# Patient Record
Sex: Male | Born: 1940 | Race: Black or African American | Hispanic: No | Marital: Married | Smoking: Former smoker
Health system: Southern US, Community
[De-identification: ages and names within clinical notes are randomized; demographics above are authoritative.]

## PROBLEM LIST (undated history)

## (undated) DIAGNOSIS — I679 Cerebrovascular disease, unspecified: Secondary | ICD-10-CM

## (undated) DIAGNOSIS — I639 Cerebral infarction, unspecified: Secondary | ICD-10-CM

## (undated) DIAGNOSIS — H409 Unspecified glaucoma: Secondary | ICD-10-CM

## (undated) DIAGNOSIS — R972 Elevated prostate specific antigen [PSA]: Secondary | ICD-10-CM

## (undated) DIAGNOSIS — Z860101 Personal history of adenomatous and serrated colon polyps: Secondary | ICD-10-CM

## (undated) DIAGNOSIS — Z8679 Personal history of other diseases of the circulatory system: Secondary | ICD-10-CM

## (undated) DIAGNOSIS — N369 Urethral disorder, unspecified: Secondary | ICD-10-CM

## (undated) DIAGNOSIS — N400 Enlarged prostate without lower urinary tract symptoms: Secondary | ICD-10-CM

## (undated) DIAGNOSIS — N281 Cyst of kidney, acquired: Secondary | ICD-10-CM

## (undated) DIAGNOSIS — K649 Unspecified hemorrhoids: Secondary | ICD-10-CM

## (undated) DIAGNOSIS — R768 Other specified abnormal immunological findings in serum: Secondary | ICD-10-CM

## (undated) DIAGNOSIS — F1011 Alcohol abuse, in remission: Secondary | ICD-10-CM

## (undated) DIAGNOSIS — I10 Essential (primary) hypertension: Secondary | ICD-10-CM

## (undated) DIAGNOSIS — F1911 Other psychoactive substance abuse, in remission: Secondary | ICD-10-CM

## (undated) DIAGNOSIS — Z86010 Personal history of colonic polyps: Secondary | ICD-10-CM

## (undated) DIAGNOSIS — Q791 Other congenital malformations of diaphragm: Secondary | ICD-10-CM

## (undated) DIAGNOSIS — A0472 Enterocolitis due to Clostridium difficile, not specified as recurrent: Secondary | ICD-10-CM

## (undated) DIAGNOSIS — Z8719 Personal history of other diseases of the digestive system: Secondary | ICD-10-CM

## (undated) DIAGNOSIS — Z8673 Personal history of transient ischemic attack (TIA), and cerebral infarction without residual deficits: Secondary | ICD-10-CM

## (undated) DIAGNOSIS — I119 Hypertensive heart disease without heart failure: Secondary | ICD-10-CM

## (undated) HISTORY — DX: Other congenital malformations of diaphragm: Q79.1

## (undated) HISTORY — DX: Cerebrovascular disease, unspecified: I67.9

## (undated) HISTORY — DX: Benign prostatic hyperplasia without lower urinary tract symptoms: N40.0

## (undated) HISTORY — DX: Hypertensive heart disease without heart failure: I11.9

## (undated) HISTORY — PX: COLONOSCOPY: SHX174

## (undated) HISTORY — DX: Enterocolitis due to Clostridium difficile, not specified as recurrent: A04.72

## (undated) HISTORY — DX: Unspecified hemorrhoids: K64.9

## (undated) HISTORY — DX: Essential (primary) hypertension: I10

## (undated) HISTORY — PX: INGUINAL HERNIA REPAIR: SUR1180

## (undated) HISTORY — PX: LIVER BIOPSY: SHX301

---

## 1998-07-13 ENCOUNTER — Emergency Department (HOSPITAL_COMMUNITY): Admission: EM | Admit: 1998-07-13 | Discharge: 1998-07-13 | Payer: Self-pay | Admitting: Emergency Medicine

## 1998-07-13 ENCOUNTER — Encounter: Payer: Self-pay | Admitting: Emergency Medicine

## 1999-05-03 ENCOUNTER — Emergency Department (HOSPITAL_COMMUNITY): Admission: EM | Admit: 1999-05-03 | Discharge: 1999-05-03 | Payer: Self-pay | Admitting: Emergency Medicine

## 1999-05-03 ENCOUNTER — Encounter: Payer: Self-pay | Admitting: Emergency Medicine

## 2000-02-06 ENCOUNTER — Emergency Department (HOSPITAL_COMMUNITY): Admission: EM | Admit: 2000-02-06 | Discharge: 2000-02-06 | Payer: Self-pay | Admitting: *Deleted

## 2000-02-21 ENCOUNTER — Encounter: Payer: Self-pay | Admitting: Urology

## 2000-02-21 ENCOUNTER — Ambulatory Visit (HOSPITAL_COMMUNITY): Admission: RE | Admit: 2000-02-21 | Discharge: 2000-02-21 | Payer: Self-pay | Admitting: Urology

## 2000-03-05 ENCOUNTER — Ambulatory Visit (HOSPITAL_COMMUNITY): Admission: RE | Admit: 2000-03-05 | Discharge: 2000-03-05 | Payer: Self-pay | Admitting: Urology

## 2000-03-05 ENCOUNTER — Encounter: Payer: Self-pay | Admitting: Urology

## 2000-03-05 ENCOUNTER — Encounter (INDEPENDENT_AMBULATORY_CARE_PROVIDER_SITE_OTHER): Payer: Self-pay | Admitting: Specialist

## 2000-03-05 HISTORY — PX: OTHER SURGICAL HISTORY: SHX169

## 2000-03-07 ENCOUNTER — Emergency Department (HOSPITAL_COMMUNITY): Admission: EM | Admit: 2000-03-07 | Discharge: 2000-03-07 | Payer: Self-pay | Admitting: Emergency Medicine

## 2000-03-08 ENCOUNTER — Emergency Department (HOSPITAL_COMMUNITY): Admission: EM | Admit: 2000-03-08 | Discharge: 2000-03-08 | Payer: Self-pay | Admitting: Emergency Medicine

## 2000-09-01 ENCOUNTER — Encounter: Payer: Self-pay | Admitting: Urology

## 2000-09-01 ENCOUNTER — Ambulatory Visit (HOSPITAL_COMMUNITY): Admission: RE | Admit: 2000-09-01 | Discharge: 2000-09-01 | Payer: Self-pay | Admitting: Urology

## 2000-12-14 ENCOUNTER — Encounter: Payer: Self-pay | Admitting: Gastroenterology

## 2000-12-14 ENCOUNTER — Ambulatory Visit (HOSPITAL_COMMUNITY): Admission: RE | Admit: 2000-12-14 | Discharge: 2000-12-14 | Payer: Self-pay | Admitting: Gastroenterology

## 2002-12-10 ENCOUNTER — Emergency Department (HOSPITAL_COMMUNITY): Admission: EM | Admit: 2002-12-10 | Discharge: 2002-12-10 | Payer: Self-pay | Admitting: Emergency Medicine

## 2002-12-10 ENCOUNTER — Encounter: Payer: Self-pay | Admitting: Emergency Medicine

## 2003-08-11 ENCOUNTER — Emergency Department (HOSPITAL_COMMUNITY): Admission: EM | Admit: 2003-08-11 | Discharge: 2003-08-11 | Payer: Self-pay | Admitting: Emergency Medicine

## 2004-06-30 ENCOUNTER — Inpatient Hospital Stay (HOSPITAL_COMMUNITY): Admission: EM | Admit: 2004-06-30 | Discharge: 2004-07-05 | Payer: Self-pay | Admitting: Emergency Medicine

## 2004-06-30 ENCOUNTER — Ambulatory Visit: Payer: Self-pay | Admitting: Pulmonary Disease

## 2004-07-01 ENCOUNTER — Encounter: Payer: Self-pay | Admitting: Cardiology

## 2004-07-01 ENCOUNTER — Ambulatory Visit: Payer: Self-pay | Admitting: Cardiology

## 2004-07-15 ENCOUNTER — Ambulatory Visit: Payer: Self-pay | Admitting: Pulmonary Disease

## 2004-10-11 ENCOUNTER — Ambulatory Visit: Payer: Self-pay | Admitting: Pulmonary Disease

## 2005-01-07 ENCOUNTER — Ambulatory Visit: Payer: Self-pay | Admitting: Pulmonary Disease

## 2005-05-06 ENCOUNTER — Ambulatory Visit: Payer: Self-pay | Admitting: Pulmonary Disease

## 2005-10-06 ENCOUNTER — Ambulatory Visit: Payer: Self-pay | Admitting: Pulmonary Disease

## 2005-11-07 ENCOUNTER — Ambulatory Visit: Payer: Self-pay | Admitting: Pulmonary Disease

## 2005-12-16 ENCOUNTER — Ambulatory Visit: Payer: Self-pay | Admitting: Gastroenterology

## 2005-12-24 ENCOUNTER — Ambulatory Visit: Payer: Self-pay | Admitting: Gastroenterology

## 2005-12-24 ENCOUNTER — Encounter (INDEPENDENT_AMBULATORY_CARE_PROVIDER_SITE_OTHER): Payer: Self-pay | Admitting: Specialist

## 2006-03-16 ENCOUNTER — Ambulatory Visit: Payer: Self-pay | Admitting: Pulmonary Disease

## 2006-07-14 ENCOUNTER — Ambulatory Visit: Payer: Self-pay | Admitting: Pulmonary Disease

## 2006-12-09 ENCOUNTER — Ambulatory Visit: Payer: Self-pay | Admitting: Pulmonary Disease

## 2007-03-31 ENCOUNTER — Ambulatory Visit: Payer: Self-pay | Admitting: Pulmonary Disease

## 2007-04-05 LAB — CONVERTED CEMR LAB
ALT: 74 units/L — ABNORMAL HIGH (ref 0–53)
AST: 71 units/L — ABNORMAL HIGH (ref 0–37)
Albumin: 4.1 g/dL (ref 3.5–5.2)
Alkaline Phosphatase: 69 units/L (ref 39–117)
Calcium: 10.1 mg/dL (ref 8.4–10.5)
Chloride: 104 meq/L (ref 96–112)
Eosinophils Absolute: 0.1 10*3/uL (ref 0.0–0.6)
Eosinophils Relative: 3.1 % (ref 0.0–5.0)
GFR calc non Af Amer: 90 mL/min
Glucose, Bld: 98 mg/dL (ref 70–99)
MCV: 92.9 fL (ref 78.0–100.0)
Platelets: 166 10*3/uL (ref 150–400)
RBC: 5.72 M/uL (ref 4.22–5.81)
Total CHOL/HDL Ratio: 5.3
Triglycerides: 103 mg/dL (ref 0–149)
VLDL: 21 mg/dL (ref 0–40)
WBC: 4.8 10*3/uL (ref 4.5–10.5)

## 2007-04-20 ENCOUNTER — Encounter: Payer: Self-pay | Admitting: Pulmonary Disease

## 2007-05-04 ENCOUNTER — Ambulatory Visit: Payer: Self-pay | Admitting: Pulmonary Disease

## 2007-05-04 ENCOUNTER — Ambulatory Visit: Payer: Self-pay | Admitting: Cardiology

## 2007-05-04 ENCOUNTER — Inpatient Hospital Stay (HOSPITAL_COMMUNITY): Admission: EM | Admit: 2007-05-04 | Discharge: 2007-05-05 | Payer: Self-pay | Admitting: Emergency Medicine

## 2007-05-05 ENCOUNTER — Encounter: Payer: Self-pay | Admitting: Cardiology

## 2007-05-05 ENCOUNTER — Encounter: Payer: Self-pay | Admitting: Pulmonary Disease

## 2007-05-05 ENCOUNTER — Ambulatory Visit: Payer: Self-pay

## 2007-05-05 HISTORY — PX: CARDIOVASCULAR STRESS TEST: SHX262

## 2007-05-06 DIAGNOSIS — N281 Cyst of kidney, acquired: Secondary | ICD-10-CM | POA: Insufficient documentation

## 2007-05-06 DIAGNOSIS — E786 Lipoprotein deficiency: Secondary | ICD-10-CM | POA: Insufficient documentation

## 2007-05-06 DIAGNOSIS — D126 Benign neoplasm of colon, unspecified: Secondary | ICD-10-CM | POA: Insufficient documentation

## 2007-05-06 DIAGNOSIS — Z91199 Patient's noncompliance with other medical treatment and regimen due to unspecified reason: Secondary | ICD-10-CM | POA: Insufficient documentation

## 2007-05-06 DIAGNOSIS — N138 Other obstructive and reflux uropathy: Secondary | ICD-10-CM | POA: Insufficient documentation

## 2007-05-06 DIAGNOSIS — I119 Hypertensive heart disease without heart failure: Secondary | ICD-10-CM | POA: Insufficient documentation

## 2007-05-06 DIAGNOSIS — I10 Essential (primary) hypertension: Secondary | ICD-10-CM | POA: Insufficient documentation

## 2007-05-06 DIAGNOSIS — Z8673 Personal history of transient ischemic attack (TIA), and cerebral infarction without residual deficits: Secondary | ICD-10-CM | POA: Insufficient documentation

## 2007-05-06 DIAGNOSIS — R972 Elevated prostate specific antigen [PSA]: Secondary | ICD-10-CM | POA: Insufficient documentation

## 2007-05-06 DIAGNOSIS — G459 Transient cerebral ischemic attack, unspecified: Secondary | ICD-10-CM | POA: Insufficient documentation

## 2007-05-06 DIAGNOSIS — I629 Nontraumatic intracranial hemorrhage, unspecified: Secondary | ICD-10-CM | POA: Insufficient documentation

## 2007-05-06 DIAGNOSIS — I679 Cerebrovascular disease, unspecified: Secondary | ICD-10-CM | POA: Insufficient documentation

## 2007-05-06 DIAGNOSIS — F1011 Alcohol abuse, in remission: Secondary | ICD-10-CM | POA: Insufficient documentation

## 2007-05-06 DIAGNOSIS — R945 Abnormal results of liver function studies: Secondary | ICD-10-CM | POA: Insufficient documentation

## 2007-05-06 DIAGNOSIS — Z9119 Patient's noncompliance with other medical treatment and regimen: Secondary | ICD-10-CM | POA: Insufficient documentation

## 2007-05-06 DIAGNOSIS — Q791 Other congenital malformations of diaphragm: Secondary | ICD-10-CM | POA: Insufficient documentation

## 2007-05-06 DIAGNOSIS — N401 Enlarged prostate with lower urinary tract symptoms: Secondary | ICD-10-CM

## 2007-05-06 DIAGNOSIS — R319 Hematuria, unspecified: Secondary | ICD-10-CM | POA: Insufficient documentation

## 2007-05-06 DIAGNOSIS — K4090 Unilateral inguinal hernia, without obstruction or gangrene, not specified as recurrent: Secondary | ICD-10-CM | POA: Insufficient documentation

## 2007-05-06 DIAGNOSIS — K649 Unspecified hemorrhoids: Secondary | ICD-10-CM | POA: Insufficient documentation

## 2007-05-12 ENCOUNTER — Telehealth: Payer: Self-pay | Admitting: Pulmonary Disease

## 2007-06-24 ENCOUNTER — Ambulatory Visit (HOSPITAL_COMMUNITY): Admission: RE | Admit: 2007-06-24 | Discharge: 2007-06-24 | Payer: Self-pay | Admitting: Surgery

## 2007-06-24 HISTORY — PX: LAPAROSCOPIC INGUINAL HERNIA WITH UMBILICAL HERNIA: SHX5658

## 2007-07-09 ENCOUNTER — Encounter: Payer: Self-pay | Admitting: Pulmonary Disease

## 2007-11-18 ENCOUNTER — Telehealth (INDEPENDENT_AMBULATORY_CARE_PROVIDER_SITE_OTHER): Payer: Self-pay | Admitting: *Deleted

## 2008-03-28 ENCOUNTER — Ambulatory Visit: Payer: Self-pay | Admitting: Pulmonary Disease

## 2008-04-03 ENCOUNTER — Ambulatory Visit: Payer: Self-pay | Admitting: Pulmonary Disease

## 2008-04-07 ENCOUNTER — Ambulatory Visit: Payer: Self-pay | Admitting: Gastroenterology

## 2008-04-25 ENCOUNTER — Encounter: Payer: Self-pay | Admitting: Gastroenterology

## 2008-04-25 ENCOUNTER — Ambulatory Visit: Payer: Self-pay | Admitting: Gastroenterology

## 2008-04-28 ENCOUNTER — Encounter: Payer: Self-pay | Admitting: Gastroenterology

## 2008-05-07 LAB — CONVERTED CEMR LAB
AST: 52 units/L — ABNORMAL HIGH (ref 0–37)
Albumin: 4.2 g/dL (ref 3.5–5.2)
BUN: 16 mg/dL (ref 6–23)
Basophils Relative: 0.2 % (ref 0.0–3.0)
Calcium: 9.9 mg/dL (ref 8.4–10.5)
Chloride: 103 meq/L (ref 96–112)
Creatinine, Ser: 1.1 mg/dL (ref 0.4–1.5)
Eosinophils Absolute: 0.1 10*3/uL (ref 0.0–0.7)
Eosinophils Relative: 2.9 % (ref 0.0–5.0)
GFR calc non Af Amer: 71 mL/min
HCT: 50.8 % (ref 39.0–52.0)
HDL: 24.1 mg/dL — ABNORMAL LOW (ref >39.0)
Hemoglobin: 17.4 g/dL — ABNORMAL HIGH (ref 13.0–17.0)
LDL Cholesterol: 69 mg/dL (ref 0–99)
MCHC: 34.2 g/dL (ref 30.0–36.0)
MCV: 92.8 fL (ref 78.0–100.0)
Monocytes Absolute: 0.3 10*3/uL (ref 0.1–1.0)
Neutro Abs: 2.7 10*3/uL (ref 1.4–7.7)
Neutrophils Relative %: 62.5 % (ref 43.0–77.0)
RBC: 5.48 M/uL (ref 4.22–5.81)
TSH: 0.95 microintl units/mL (ref 0.35–5.50)
Triglycerides: 100 mg/dL (ref 0–149)
VLDL: 20 mg/dL (ref 0–40)
WBC: 4.3 10*3/uL — ABNORMAL LOW (ref 4.5–10.5)

## 2008-06-28 ENCOUNTER — Telehealth: Payer: Self-pay | Admitting: Pulmonary Disease

## 2008-07-20 ENCOUNTER — Telehealth (INDEPENDENT_AMBULATORY_CARE_PROVIDER_SITE_OTHER): Payer: Self-pay | Admitting: *Deleted

## 2009-01-03 ENCOUNTER — Telehealth: Payer: Self-pay | Admitting: Pulmonary Disease

## 2009-06-20 ENCOUNTER — Ambulatory Visit: Payer: Self-pay | Admitting: Pulmonary Disease

## 2009-06-20 ENCOUNTER — Telehealth (INDEPENDENT_AMBULATORY_CARE_PROVIDER_SITE_OTHER): Payer: Self-pay | Admitting: *Deleted

## 2009-06-20 DIAGNOSIS — M549 Dorsalgia, unspecified: Secondary | ICD-10-CM | POA: Insufficient documentation

## 2009-06-25 ENCOUNTER — Emergency Department (HOSPITAL_COMMUNITY): Admission: EM | Admit: 2009-06-25 | Discharge: 2009-06-25 | Payer: Self-pay | Admitting: Emergency Medicine

## 2009-06-29 ENCOUNTER — Telehealth: Payer: Self-pay | Admitting: Adult Health

## 2009-07-02 LAB — CONVERTED CEMR LAB
ALT: 49 units/L (ref 0–53)
Alkaline Phosphatase: 66 units/L (ref 39–117)
Basophils Relative: 1.1 % (ref 0.0–3.0)
Bilirubin, Direct: 0.1 mg/dL (ref 0.0–0.3)
Calcium: 10 mg/dL (ref 8.4–10.5)
Chloride: 105 meq/L (ref 96–112)
Creatinine, Ser: 1.1 mg/dL (ref 0.4–1.5)
Eosinophils Relative: 3.2 % (ref 0.0–5.0)
Lymphocytes Relative: 27.3 % (ref 12.0–46.0)
MCV: 94.1 fL (ref 78.0–100.0)
Neutrophils Relative %: 62.1 % (ref 43.0–77.0)
RBC: 5.77 M/uL (ref 4.22–5.81)
Sodium: 142 meq/L (ref 135–145)
Total Protein: 7.9 g/dL (ref 6.0–8.3)
WBC: 5.2 10*3/uL (ref 4.5–10.5)

## 2009-07-05 ENCOUNTER — Telehealth: Payer: Self-pay | Admitting: Adult Health

## 2009-08-10 ENCOUNTER — Ambulatory Visit: Payer: Self-pay | Admitting: Pulmonary Disease

## 2009-09-04 ENCOUNTER — Telehealth (INDEPENDENT_AMBULATORY_CARE_PROVIDER_SITE_OTHER): Payer: Self-pay | Admitting: *Deleted

## 2009-09-05 ENCOUNTER — Ambulatory Visit: Payer: Self-pay | Admitting: Pulmonary Disease

## 2009-09-05 DIAGNOSIS — H9209 Otalgia, unspecified ear: Secondary | ICD-10-CM | POA: Insufficient documentation

## 2010-07-04 NOTE — Assessment & Plan Note (Signed)
Summary: back pain/ear ache/cxr 1st/ok per leigh/mg   CC:  Add-on visit for acute right sided back pain & right ear pain....  History of Present Illness: 70 y/o BM here for a follow up visit... he has multiple medical problems as noted below...     ~  August 10, 2009:  he had a good year overall w/ BP controlled on 5 drug regimen & he is reminded to take them regularly... he had left sided back discomfort & saw TP 1/11- improved w/ Advil/ Skelaxin... he had f/u colonoscopy 11/09 w/ several adenomatous polyps removed & DrStark plans repeat in 69yrs... he continues to see DrNesi every 96mo & he does his PSA's (pt reports they are all "OK"... he adamantly refuses all vaccinations.   ~  September 05, 2009:  add-on appt for 2 week hx right sided back pain & right ear pain... notes intermittent (daily 5-6 x per day), severe (9/10), mid right back pain below scap tip... pain ppt by certain movements, occas relieved by other movements, lasts 1-2 min & resolves... "it ain't good" he says- hasn't taken any meds for this despite hx left sided back pain 1/11 (seen by TP & in ER- neg CXR, neg TSpine films, norm lab work)... **we discussed Rx w/ [id number] & ParafonTid regularly to decr freq & severity of the pain. Also c/o right ear pain x 2weeks- exam shows mild OM & Rx w/ antibiotics, & Auralgan gtts.   Current Problems:   1. 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  ~  CXR 1/11 showed elev right hemidiaph w/ scarring, NAD...  2. HYPERTENSION, SEVERE (ICD-401.0) - long hx severe HBP and noncompliance w/ med Rx;  resulted in [place] w/ small ICH 1/06;  supposed to be on a 5 drug regimen:  LABETOLOL 200mg - 2tabsBid,  HYDRALAZINE 25mg Bid,  AMLODIPINE 10mg /d,  COZAAR 100mg /d,  LASIX 40mg /d...  BP= 140/90, & similar at home- taking meds regularly he says, & tolerating well... denies HA, fatigue, visual changes, CP, palipit, dizziness, syncope, dyspnea, edema, etc...   3.  HYPERTENSIVE CARDIOVASCULAR DISEASE (ICD-402.90)  ~  Baseline EKG w/ NSSTTWA...  ~  2D Echo 1/06 showed mod incr LV wall thickness...  ~  repeat 2DEcho 12/08 showed mild incr LV wall thickness, norm LVF w/ EF= 65%...  4. PERS HX NONCOMPLIANCE W/MED TX PRS HAZARDS HLTH (ICD-V15.81) - BP's have been adeq controlled when he takes his meds regularly... we discuss medication compliance at each & every OV.  5. CEREBROVASCULAR DISEASE (ICD-437.9) - MRIBrain 1/06 showed small Rt occipital hem, mild atrophy, & chr sm vessel ischemic dz...  6. Hx of INTRACRANIAL HEMORRHAGE (ICD-432.9)  7. 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 he is counselled on a low chol/ low fat diet & exercise program to help his HDL...  ~  FLP 10/09 showed TChol 113, TG 100, HDL 24, LDL 69... rec> incr exercise.  8. LIVER FUNCTION TESTS, ABNORMAL (ICD-794.8) - Hx prev GI evals:  Remote hx Etoh & drug abuse;  Hx HepA 1960's; GI eval 1979 DrWeissman w/ + HepBSAg & liver bx showing Chr Persist Hepatitis;  GI eval DrStark 2004 w/ prob fatty infiltration of liver;  LFT's  ~ 70/70 11/08;  ~  labs 10/09 showed SGOT= 52, SGPT= 54... rec> no hepatotoxins, diet, keep wt down.  ~  labs 1/11 showed SGOT= 44, SGPT= 49  9. 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  ~  colonoscopy 11/09 by DrStark showed mult polyps 4-58mm size (adenomatous on bx), f/u 63yrs.  10. HEMORRHOIDS (ICD-455.6)  11. INGUINAL HERNIA, RIGHT (ICD-550.90) >> repaired by drGross, CCS in Jan09...  12. PSA, INCREASED (ICD-790.93) - eval by DrNesi for Urology & pt reports that he is checked every 22mo...  ~  labs 6/07 showed PSA= 6.13,    ~  labs 11/08 showed PSA= 5.11 we don't have recent notes from [facility]  13. BENIGN PROSTATIC HYPERTROPHY, HX OF (ICD-V13.8)  14. Hx of HEMATURIA UNSPECIFIED (ICD-599.70) - s/p neg cystoscopy 10/01...  15. RENAL CYST  (ICD-593.2)  16. 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 BACK PAIN (ICD-724.5) - hx fluctuating left/ right sides back pains... TSpine films 1/11 w/ spondylitic changes, athropathy, etc... treated to OTC anti-inflamm rx vs MOBIC 15mg /d, and muscle relaxers Skelaxin vs PARAFON FORTE Tid...   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 heart disease - MGM rheumatism - MGM, mother DM - mother, MGM stroke -  mother, MGM  Social History: Reviewed history from 06/20/2009 and no changes required. former smoker, quit 11/1981 x65yrs <1ppd. no alcohol married 1 child still working: Environmental manager  Review of Systems      See HPI       The patient complains of decreased hearing, dyspnea on exertion, and difficulty walking.  The patient denies anorexia, fever, weight loss, weight gain, vision loss, hoarseness, chest pain, syncope, peripheral edema, prolonged cough, headaches, hemoptysis, abdominal pain, melena, hematochezia, severe indigestion/heartburn, hematuria, incontinence, muscle weakness, suspicious skin lesions, transient blindness, depression, unusual weight change, abnormal bleeding, enlarged  lymph nodes, and angioedema.    Vital Signs:  Patient profile:   70 year old male Height:      71 inches Weight:      196.25 pounds BMI:     27.47 O2 Sat:      94 % on Room air Temp:     97.0 degrees F oral Pulse rate:   63 / minute BP sitting:   140 / 90  (left arm) Cuff size:   regular  Vitals Entered By: Randell Loop CMA (September 05, 2009 11:43 AM)  O2 Sat at Rest %:  94 O2 Flow:  Room air CC: Add-on visit for acute right sided back pain & right ear pain... Is Patient Diabetic? No Pain Assessment Patient in pain? yes      Comments no changes in meds    Physical Exam  Additional Exam:  WD, WN, 70 y/o BM in NAD... GENERAL:  Alert & oriented; pleasant & cooperative. HEENT:  [place]/AT, EOM-wnl, PERRLA, EACs-clear, TMs- red sl bulge on right, NOSE-clear, THROAT-clear & wnl. NECK:  Supple w/ fairROM; no JVD; normal carotid impulses w/o bruits; no thyromegaly or nodules palpated; no lymphadenopathy. CHEST:  Clear to P & A; without wheezes/ rales/ or rhonchi. HEART:  Regular Rhythm; without murmurs/ rubs/ or gallops. ABDOMEN:  Soft & nontender; normal bowel sounds; no organomegaly or masses detected. EXT: without deformities or arthritic changes; no varicose veins/ venous insuffic/ or edema... BACK:  no lesions seen, non-tender to palp or motion of arm/ shoulder... NEURO:  CN's intact; motor testing normal; sensory testing normal; gait normal & balance OK. DERM:  round patch on right lat thigh area- ?ring worm ?dermatitis- try rx w/ Lotrisone cream.    CXR  Procedure date:  09/05/2009  Findings:      CHEST - 2 VIEW Comparison: 06/25/2009 and earlier.   Findings: Lower lung volumes.  Stable elevation of the right hemidiaphragm.  Stable cardiac size and mediastinal contours. Chronic mild rightward deviation of the trachea at the thoracic inlet may reflect left thyromegaly.  No pneumothorax, pulmonary edema, pleural effusion or definite acute airspace disease.  Stable visualized osseous structures.   IMPRESSION: 1.  Lower lung volumes, otherwise no acute cardiopulmonary abnormality. 2.  Suggestion of chronic left lobe thyromegaly.  Correlate with physical exam or thyroid ultrasound.   Read By:  Augusto Gamble,  M.D.    Impression & Recommendations:  Problem # 1:  EAR PAIN (ICD-388.70) He appears to have a mild OM >>  Rx w/ Doxycycline (Allergic Pcn) & Auralgan gtts... His updated medication list for this problem includes:    Doxycycline Hyclate 100 Mg Caps (Doxycycline hyclate) .Marland Kitchen... Take 1 cap by mouth two times a day til gone...    Auralgan Soln (Antipyrine-benzocaine-polycos) ..... Instill 2 drops evry 4-6 h in affected ear...  Problem # 2:  BACK PAIN (ICD-724.5) Appears to be inflamm related... Rx w/ MOBIC15 & PARAFON Tid... +rest/ heat/ etc... His updated medication list for this problem includes:    Aspirin 325 Mg Tabs (Aspirin) .Marland Kitchen... Take 1 tablet by mouth once a day    Meloxicam 15 Mg Tabs (Meloxicam) .Marland Kitchen... Take 1 tab by mouth once daily as needed for inflammation & pain...    Parafon Forte Dsc 500 Mg Tabs (Chlorzoxazone) .Marland Kitchen... Take 1 tab by mouth three times a day for muscle spasm...  Orders: T-2 View CXR (71020TC) Depo- Medrol 80mg  (J1040) Admin of Therapeutic Inj  intramuscular or subcutaneous (16109)  Problem # 3:  HYPERTENSION, SEVERE (ICD-401.0) BP reasonable on 5 med regimen... we discussed & pt reminded to take meds regualrly... His updated medication list for this problem includes:    Labetalol Hcl 200 Mg Tabs (Labetalol hcl) .Marland Kitchen... 2 tabs twice daily    Amlodipine Besylate 10 Mg Tabs (Amlodipine besylate) .Marland Kitchen... 1 tab daily    Lisinopril 20 Mg Tabs (Lisinopril) .Marland Kitchen... Take 1 tablet by mouth once a day    Hydralazine Hcl 25 Mg Tabs (Hydralazine hcl) .Marland Kitchen... 1 tab twice daily    Furosemide 40 Mg Tabs (Furosemide) .Marland Kitchen... 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  Problem # 5:  OTHER MEDICAL PROBLEMS REVIEWED>>> Continue present Rx... NOTE: Radiologist reports mild trach deviation from left ?enlarged thyroid... no mass in neck & he is clinically & biochemically euthyroid... we will continue to observe & assess...  Complete Medication List: 1)  Aspirin 325 Mg Tabs (Aspirin) .... Take 1 tablet by mouth once a day 2)  Labetalol Hcl 200 Mg Tabs (Labetalol hcl) .... 2 tabs twice daily 3)  Amlodipine Besylate 10 Mg Tabs (Amlodipine besylate) .Marland Kitchen.. 1 tab daily 4)  Lisinopril 20 Mg Tabs (  Lisinopril) .... Take 1 tablet by mouth once a day 5)  Hydralazine Hcl 25 Mg Tabs (Hydralazine hcl) .Marland Kitchen.. 1 tab twice daily 6)  Furosemide 40 Mg Tabs (Furosemide) .Marland Kitchen.. 1 tab daily 7)  Cialis 20 Mg Tabs (Tadalafil) .... As needed 8)  Lotrisone 1-0.05 % Crea (Clotrimazole-betamethasone) .... Apply to rash two times a day... 9)  Doxycycline Hyclate 100 Mg Caps (Doxycycline hyclate) .... Take 1 cap by mouth two times a day til gone... 10)  Auralgan Soln (Antipyrine-benzocaine-polycos) .... Instill 2 drops evry 4-6 h in affected ear... 11)  Meloxicam 15 Mg Tabs (Meloxicam) .... Take 1 tab by mouth once daily as needed for inflammation & pain... 12)  Parafon Forte Dsc 500 Mg Tabs (Chlorzoxazone) .... Take 1 tab by mouth three times a day for muscle spasm...  Other Orders: Prescription Created Electronically [id number])  Patient Instructions: 1)  Today we updated your med list- see below.... 2)  For your EAR:  take the DOXTCYCLINE antibiotic twice daily til gone, and use the EAR DROPS- 2 drops in the right ear every 4-6 H for the pain.Marland KitchenMarland Kitchen 3)  For your Back:  rest the chest, no heavy lifting etc... apply heat (heating pad, icey hott, etc... take the MELOXICAM one tab daily, and the PARAFON three times per day... 4)  Continue your BP meds regularly every day... 5)  Call for any questions... Prescriptions: FUROSEMIDE 40 MG  TABS (FUROSEMIDE) 1 tab daily  #30 x prn    Entered by:   Randell Loop CMA   Authorized by:   Michele Mcalpine MD   Signed by:   Randell Loop CMA on 09/05/2009   Method used:   Electronically to        [facility]* (retail)       [address] [place], [place]  [address]       Ph: [phone]       Fax: [phone]   RxID:   [id number] HYDRALAZINE HCL 25 MG  TABS (HYDRALAZINE HCL) 1 tab twice daily  #60 x prn   Entered by:   Randell Loop CMA   Authorized by:   Michele Mcalpine MD   Signed by:   Randell Loop CMA on 09/05/2009   Method used:   Electronically to        [facility]* (retail)       [address] [place], [place]  [address]       Ph: [phone]       Fax: [phone]   RxID:   [id number] LISINOPRIL 20 MG TABS (LISINOPRIL) Take 1 tablet by mouth once a day  #90 Each x 5   Entered by:   Randell Loop CMA   Authorized by:   Michele Mcalpine MD   Signed by:   Randell Loop CMA on 09/05/2009   Method used:   Electronically to        [facility]* (retail)       [address] [place], [place]  [address]       Ph: [phone]       Fax: [phone]   RxID:   [id number] AMLODIPINE BESYLATE 10 MG  TABS (AMLODIPINE BESYLATE) 1 tab daily  #30 Each x 5  Entered by:   Randell Loop CMA   Authorized by:   Michele Mcalpine MD   Signed by:   Randell Loop CMA on 09/05/2009   Method used:   Electronically to        [facility]* (retail)       [address] [place], [place]  [address]       Ph: [phone]       Fax: [phone]   RxID:   [id number] LABETALOL HCL 200 MG  TABS (LABETALOL HCL) 2 tabs twice daily  #120 x prn   Entered by:   Randell Loop CMA   Authorized by:   Michele Mcalpine MD   Signed by:   Randell Loop CMA on 09/05/2009   Method used:   Electronically to        [facility]* (retail)       [address] [place], [place]   [address]       Ph: [phone]       Fax: [phone]   RxID:   [id number] PARAFON FORTE DSC 500 MG TABS (CHLORZOXAZONE) take 1 tab by mouth three times a day for muscle spasm...  #90 x 2   Entered and Authorized by:   Michele Mcalpine MD   Signed by:   Michele Mcalpine MD on 09/05/2009   Method used:   Print then Give to Patient   RxID:   [id number] MELOXICAM 15 MG TABS (MELOXICAM) take 1 tab by mouth once daily as needed for inflammation & pain...  #30 x 2   Entered and Authorized by:   Michele Mcalpine MD   Signed by:   Michele Mcalpine MD on 09/05/2009   Method used:   Print then Give to Patient   RxID:   [id number] AURALGAN  SOLN (ANTIPYRINE-BENZOCAINE-POLYCOS) instill 2 drops evry 4-6 H in affected ear...  #1 vial x 1   Entered and Authorized by:   Michele Mcalpine MD   Signed by:   Michele Mcalpine MD on 09/05/2009   Method used:   Print then Give to Patient   RxID:   [id number] DOXYCYCLINE HYCLATE 100 MG CAPS (DOXYCYCLINE HYCLATE) take 1 cap by mouth two times a day til gone...  #20 x 0   Entered and Authorized by:   Michele Mcalpine MD   Signed by:   Michele Mcalpine MD on 09/05/2009   Method used:   Print then Give to Patient   RxID:   [id number]    Medication Administration  Injection # 1:    Medication: Depo- Medrol 80mg     Diagnosis: BACK PAIN (ICD-724.5)    Route: IM    Site: LUOQ gluteus    Exp Date: 04/2012    Lot #: [id number]    Mfr: Pharmacia    Patient tolerated injection without complications    Given by: Randell Loop CMA (September 05, 2009 1:01 PM)  Orders Added: 1)  T-2 View CXR [71020TC] 2)  Prescription Created Electronically [G8553] 3)  Est. Patient Level IV [89381] 4)  Depo- Medrol 80mg  [J1040] 5)  Admin of Therapeutic Inj  intramuscular or subcutaneous [01751]

## 2010-07-04 NOTE — Progress Notes (Signed)
Summary: ortho referral info  ---- Converted from flag ---- ---- 07/04/2009 3:32 PM, Rubye Oaks NP wrote: thanks for all your hard work   ---- 07/04/2009 3:32 PM, Alfonso Ramus wrote: Jamal Maes to schedule pt an appt with [facility] and they did not take pt's insurance.  Tried to schedule appt with [facility] and they would not schedule appt until pt called their billing office. Called pt and advised him to [employer]. Never heard back from pt so I called [facility] today. Hailey with [facility] stated that pt did call and that they agreed to see him, however, he stated that he had started feeling better and would call them if he needed them. No appt was scheduled.  All the above is documented in order.  Just wanted to let you know. Thanks, Bjorn Loser ------------------------------

## 2010-07-04 NOTE — Progress Notes (Signed)
Summary: shoulder pain  Phone Note Call from Patient Call back at [phone]   Caller: Patient Call For: nadel Reason for Call: Talk to Nurse Summary of Call: pt having intense shoulder pain.  Wants to see SN Initial call taken by: Eugene Gavia,  June 20, 2009 3:10 PM  Follow-up for Phone Call        called, spoke with pt.  Pt c/o left constant shoulder pain x2days.  describes the pain as an achey pain that is intense and worse at ngiht.  Pt states he is ok to be seen by TP-states "I don't care who I'm seen by as long as I'm seen."  appt scheduled with TP for today at 4:15.   Pt aware and ok with this. Follow-up by: Gweneth Dimitri RN,  June 20, 2009 3:39 PM

## 2010-07-04 NOTE — Progress Notes (Signed)
Summary: should pain--pain meds not working  Phone Note Call from Patient   Caller: Patient Summary of Call: pt called back for his lab results and he is aware of the results---pt stated that he saw TP on 1-19 for his shoulder pain---was given skelaxin 800mg  1 three times a day as needed  and has been taking ibuprofen 800mg  as needed.  the pain got so bad he went to the ER for his shoulder and they gave him vicodin and pt stated that this has not helped at all either---would like to know what else he can do for this??  please advise.  allergies---pcn Initial call taken by: Randell Loop CMA,  June 29, 2009 4:02 PM  Follow-up for Phone Call        I do not have any other suggestions for shoulder pain  except referral to ortho which is welcome to have.  This is unusal, pain in localized in shoulded, no redness,or fever xray in ER w/ acute process.  refer to ortho,  cont w/ motrin and vicodin, w/ ice and heat.  Please contact office for sooner follow up if symptoms do not improve or worsen  Follow-up by: Gorgeous Newlun NP,  June 29, 2009 4:51 PM

## 2010-07-04 NOTE — Assessment & Plan Note (Signed)
Summary: Acute NP office - back pain   CC:  back pain at left shoulder blade x2-3days and states feels like arthritic pain and is worse at night than throughout the day.  History of Present Illness: 70 with known hx of  multiple  medical problems including cerbrovascular dz, HTN-severe  June 20, 2009--Presents for an acute office visit. Complains of back pain at left shoulder blade x2-3days, states feels like arthritic pain and is worse at night than throughout the day. Worse if he lifts arm up over head, no radicular symptoms. no extremity weakness. Denies chest pain, dyspnea, orthopnea, hemoptysis, fever, n/v/d, edema, headache, exertional chest pain. B/P is elevated, he did not take meds today. We talked about the dangers of HTN.     Preventive Screening-Counseling & Management  Alcohol-Tobacco     Smoking Status: quit  Medications Prior to Update: 1)  Adult Aspirin Low Strength 81 Mg  Tbdp (Aspirin) .Marland Kitchen.. 1 Tab Daily 2)  Lisinopril 20 Mg Tabs (Lisinopril) .... Take 1 Tablet By Mouth Once A Day 3)  Amlodipine Besylate 10 Mg  Tabs (Amlodipine Besylate) .Marland Kitchen.. 1 Tab Daily 4)  Labetalol Hcl 200 Mg  Tabs (Labetalol Hcl) .... 2 Tabs Twice Daily 5)  Hydralazine Hcl 25 Mg  Tabs (Hydralazine Hcl) .Marland Kitchen.. 1 Tab Twice Daily 6)  Furosemide 40 Mg  Tabs (Furosemide) .Marland Kitchen.. 1 Tab Daily 7)  Cialis 20 Mg Tabs (Tadalafil) .... As Needed  Current Medications (verified): 1)  Adult Aspirin Low Strength 81 Mg  Tbdp (Aspirin) .Marland Kitchen.. 1 Tab Daily 2)  Lisinopril 20 Mg Tabs (Lisinopril) .... Take 1 Tablet By Mouth Once A Day 3)  Amlodipine Besylate 10 Mg  Tabs (Amlodipine Besylate) .Marland Kitchen.. 1 Tab Daily 4)  Labetalol Hcl 200 Mg  Tabs (Labetalol Hcl) .... 2 Tabs Twice Daily 5)  Hydralazine Hcl 25 Mg  Tabs (Hydralazine Hcl) .Marland Kitchen.. 1 Tab Twice Daily 6)  Furosemide 40 Mg  Tabs (Furosemide) .Marland Kitchen.. 1 Tab Daily 7)  Cialis 20 Mg Tabs (Tadalafil) .... As Needed  Allergies (verified): 1)  Penicillin V Potassium (Penicillin V  Potassium)  Past History:  Family History: Last updated: 06/20/2009 heart disease - MGM rheumatism - MGM, mother DM - mother, MGM stroke -  mother, MGM  Social History: Last updated: 06/20/2009 former smoker, quit 11/1981 x21yrs <1ppd. no alcohol married 1 child still working: Environmental manager  Risk Factors: Smoking Status: quit (06/20/2009)  Past Medical History: 70. 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 HYPERTENSION, SEVERE (ICD-401.0) - long hx severe HBP and noncompliance w/ med Rx;  resulted in [place] w/ small ICH 1/06;  supposed to be on a 5 drug regimen:  LABETOLOL 200mg - 2tabsBid,  AMLODIPINE 10mg /d,  COZAAR 100mg /d,  LASIX 40mg /d,  HYDRALAZINE 25mg Bid...    3. HYPERTENSIVE CARDIOVASCULAR DISEASE (ICD-402.90) - Baseline EKG w/ NSSTTWA;  2D 1/06 showed mod incr LV wall thickness;  4. PERS HX NONCOMPLIANCE W/MED TX PRS HAZARDS HLTH (ICD-V15.81) - BP's have been adeq controlled when he takes his meds regularly;  5. CEREBROVASCULAR DISEASE (ICD-437.9) - MRIBrain 1/06 showed small Rt occipital hem, mild atrophy, & chr sm vessel ischemic dz;  6. Hx of INTRACRANIAL HEMORRHAGE (ICD-432.9)  7. 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 he is counselled on a low chol/ low fat diet & exercise program to help his HDL...  ~  FLP 10/09 showed TChol 113, TG 100, HDL 24, LDL 69... rec> incr  exercise.  8. LIVER FUNCTION TESTS, ABNORMAL (ICD-794.8) - Hx prev GI evals:  Remote hx Etoh & drug abuse;  Hx HepA 1960's; GI eval 1979 DrWeissman w/ + HepBSAg & liver bx showing Chr Persist Hepatitis;  GI eval DrStark 2004 w/ prob fatty infiltration of liver;  LFT's  ~ 70/70 11/08;  ~  labs 10/09 showed SGOT= 52, SGPT= 54... rec> no hepatotoxins, diet, keep wt down.  Family History: heart disease - MGM rheumatism - MGM, mother DM - mother, MGM stroke -  mother, MGM  Social History: former smoker, quit 11/1981  x80yrs <1ppd. no alcohol married 1 child still working: photographerSmoking Status:  quit  Review of Systems      See HPI  Vital Signs:  Patient profile:   70 year old male Height:      71 inches Weight:      192.50 pounds BMI:     26.95 O2 Sat:      93 % on Room air Temp:     97.3 degrees F oral Pulse rate:   76 / minute BP sitting:   158 / 94  (right arm) Cuff size:   regular  Vitals Entered By: Boone Master CNA (June 20, 2009 4:24 PM)  O2 Flow:  Room air CC: back pain at left shoulder blade x2-3days, states feels like arthritic pain and is worse at night than throughout the day Is Patient Diabetic? No Comments Medications reviewed with patient Daytime contact number verified with patient. Boone Master CNA  June 20, 2009 4:24 PM    Physical Exam  Additional Exam:  WD, WN, 70 y/o BM in NAD... GENERAL:  Alert & oriented; pleasant & cooperative. HEENT:  [place]/AT, EOM-wnl, PERRLA, Fundi-benign, EACs-clear, TMs-wnl, NOSE-clear, THROAT-clear & wnl. NECK:  Supple w/ fairROM; no JVD; normal carotid impulses w/o bruits; no thyromegaly or nodules palpated; no lymphadenopathy. CHEST:  Clear to P & A; without wheezes/ rales/ or rhonchi. HEART:  Regular Rhythm; without murmurs/ rubs/ or gallops. ABDOMEN:  Soft & nontender; normal bowel sounds; no organomegaly or masses detected. RECTAL:  Neg - prostate 3+ & nontender w/o nodules; stool hematest neg. EXT: without deformities or arthritic changes; no varicose veins/ venous insuffic/ or edema, tender along subscapular area, no rash, or eccymosis. nml grips bilaterally, equal strength.  NEURO:  CN's intact; motor testing normal; sensory testing normal; gait normal & balance OK. DERM:  No lesions noted; no rash etc...     Impression & Recommendations:  Problem # 1:  BACK PAIN (ICD-724.5)  Shoulder /back strain will use NSAIDS - cautiously  REC   Warm heat to shoulder  Skelaxin 800mg  three times a day as needed muscle  spasm.  Advil 200mg  3 tab two times a day for 4 days Call if your blood pressure >160/90  Please contact office for sooner follow up if symptoms do not improve or worsen  follow up Dr. Kriste Basque in 6 weeks (or first availabe ) for physical.   Orders: Est. Patient Level IV (16109)  Problem # 2:  HYPERTENSION, SEVERE (ICD-401.0) advised to take meds regularly, do not skip.  labs pending.   His updated medication list for this problem includes:    Lisinopril 20 Mg Tabs (Lisinopril) .Marland Kitchen... Take 1 tablet by mouth once a day    Amlodipine Besylate 10 Mg Tabs (Amlodipine besylate) .Marland Kitchen... 1 tab daily    Labetalol Hcl 200 Mg Tabs (Labetalol hcl) .Marland Kitchen... 2 tabs twice daily    Hydralazine Hcl 25 Mg Tabs (Hydralazine  hcl) ..... 1 tab twice daily    Furosemide 40 Mg Tabs (Furosemide) .Marland Kitchen... 1 tab daily  Orders: TLB-BMP (Basic Metabolic Panel-BMET) (80048-METABOL) TLB-CBC Platelet - w/Differential (85025-CBCD) TLB-Hepatic/Liver Function Pnl (80076-HEPATIC) TLB-TSH (Thyroid Stimulating Hormone) (84443-TSH) Est. Patient Level IV (04540)  Medications Added to Medication List This Visit: 1)  Skelaxin 800 Mg Tabs (Metaxalone) .Marland Kitchen.. 1 by mouth three times a day as needed muscle spasm  Complete Medication List: 1)  Adult Aspirin Low Strength 81 Mg Tbdp (Aspirin) .Marland Kitchen.. 1 tab daily 2)  Lisinopril 20 Mg Tabs (Lisinopril) .... Take 1 tablet by mouth once a day 3)  Amlodipine Besylate 10 Mg Tabs (Amlodipine besylate) .Marland Kitchen.. 1 tab daily 4)  Labetalol Hcl 200 Mg Tabs (Labetalol hcl) .... 2 tabs twice daily 5)  Hydralazine Hcl 25 Mg Tabs (Hydralazine hcl) .Marland Kitchen.. 1 tab twice daily 6)  Furosemide 40 Mg Tabs (Furosemide) .Marland Kitchen.. 1 tab daily 7)  Cialis 20 Mg Tabs (Tadalafil) .... As needed 8)  Skelaxin 800 Mg Tabs (Metaxalone) .Marland Kitchen.. 1 by mouth three times a day as needed muscle spasm  Patient Instructions: 1)  Please take you blood pressure meds, do not forget, it is very dangerous to skip these meds.  2)  Warm heat to  shoulder  3)  Skelaxin 800mg  three times a day as needed muscle spasm.  4)  Advil 200mg  3 tab two times a day for 4 days 5)  Call if your blood pressure >160/90  6)  Please contact office for sooner follow up if symptoms do not improve or worsen  7)  follow up Dr. Kriste Basque in 6 weeks (or first availabe ) for physical.  Prescriptions: FUROSEMIDE 40 MG  TABS (FUROSEMIDE) 1 tab daily  #30 Each x 3   Entered and Authorized by:   Rubye Oaks NP   Signed by:   Rubye Oaks NP on 06/20/2009   Method used:   Electronically to        [facility]* (retail)       [address] [place], [place]  [address]       Ph: [phone]       Fax: [phone]   RxID:   [id number] SKELAXIN 800 MG TABS (METAXALONE) 1 by mouth three times a day as needed muscle spasm  #30 x 0   Entered and Authorized by:   Rubye Oaks NP   Signed by:   Rubye Oaks NP on 06/20/2009   Method used:   Electronically to        [facility]* (retail)       [address] [place], [place]  [address]       Ph: [phone]       Fax: [phone]   RxID:   [id number]    Immunization History:  Influenza Immunization History:    Influenza:  refuses (06/20/2009)  Pneumovax Immunization History:    Pneumovax:  refuses (06/20/2009)

## 2010-07-04 NOTE — Assessment & Plan Note (Signed)
Summary: yearly f/u/fasting/apc   CC:  16 month ROV & review of mult medical problems....  History of Present Illness: 70 y/o [place] here for a follow up visit... he has multiple medical problems as noted below...     ~  August 10, 2009:  he had a good year overall w/ BP controlled on 5 drug regimen & he is reminded to take them regularly... he had left sided back discomfort & saw TP 1/11- improved w/ Advil/ Skelaxin... he had f/u colonoscopy 11/09 w/ several adenomatous polyps removed & DrStark plans repaet in 99yrs... he continues to see DrNesi every 69mo & he does his PSA's (pt reports they are all "OK"... he adamantly refuses all vaccinations.   Current Problems:   1. 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  ~  CXR 1/11 showed elev right hemidiaph w/ scarring, NAD...  2. HYPERTENSION, SEVERE (ICD-401.0) - long hx severe HBP and noncompliance w/ med Rx;  resulted in [place] w/ small ICH 1/06;  supposed to be on a 5 drug regimen:  LABETOLOL 200mg - 2tabsBid,  HYDRALAZINE 25mg Bid,  AMLODIPINE 10mg /d,  COZAAR 100mg /d,  LASIX 40mg /d...  BP= 122/84, & similar at home- taking meds regularly he says, & tolerating well... denies HA, fatigue, visual changes, CP, palipit, dizziness, syncope, dyspnea, edema, etc...   3. HYPERTENSIVE CARDIOVASCULAR DISEASE (ICD-402.90) -   ~  Baseline EKG w/ NSSTTWA...  ~  2D Echo 1/06 showed mod incr LV wall thickness...  ~  repeat 2DEcho 12/08 showed mild incr LV wall thickness, norm LVF w/ EF= 65%...  4. PERS HX NONCOMPLIANCE W/MED TX PRS HAZARDS HLTH (ICD-V15.81) - BP's have been adeq controlled when he takes his meds regularly...  5. CEREBROVASCULAR DISEASE (ICD-437.9) - MRIBrain 1/06 showed small Rt occipital hem, mild atrophy, & chr sm vessel ischemic dz...  6. Hx of INTRACRANIAL HEMORRHAGE (ICD-432.9)  7. 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 he is counselled on a low chol/ low  fat diet & exercise program to help his HDL...  ~  FLP 10/09 showed TChol 113, TG 100, HDL 24, LDL 69... rec> incr exercise.  8. LIVER FUNCTION TESTS, ABNORMAL (ICD-794.8) - Hx prev GI evals:  Remote hx Etoh & drug abuse;  Hx HepA 1960's; GI eval 1979 DrWeissman w/ + HepBSAg & liver bx showing Chr Persist Hepatitis;  GI eval DrStark 2004 w/ prob fatty infiltration of liver;  LFT's  ~ 70/70 11/08;  ~  labs 10/09 showed SGOT= 52, SGPT= 54... rec> no hepatotoxins, diet, keep wt down.  ~  labs 1/11 showed SGOT= 44, SGPT= 49  9. COLONIC POLYPS (ICD-211.3)  ~  colonoscopy 7/07 by DrStark w/ mult polyps 4-38mm size (adenomatous)& hems;  f/u 3 yrs.  ~  colonoscopy 11/09 by DrStark showed mult polyps 4-72mm size (adenomatous on bx), f/u 55yrs.  10. HEMORRHOIDS (ICD-455.6)  11. INGUINAL HERNIA, RIGHT (ICD-550.90) >> repaired by drGross, [facility] in Jan09...  12. PSA, INCREASED (ICD-790.93) - eval by DrNesi for Urology & pt reports that he is checked every 69mo...  ~  labs 6/07 showed PSA= 6.13,    ~  labs 11/08 showed PSA= 5.11 we don't have recent notes from [facility]  13. BENIGN PROSTATIC HYPERTROPHY, HX OF (ICD-V13.8)  14. Hx of HEMATURIA UNSPECIFIED (ICD-599.70) - s/p neg cystoscopy 10/01...  15. RENAL CYST (ICD-593.2)  16. 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 heart disease - MGM rheumatism - MGM, mother DM - mother, MGM stroke -  mother, MGM  Social History: Reviewed history from 06/20/2009 and no changes required. former smoker, quit 11/1981 x40yrs <1ppd. 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    Vital Signs:  Patient profile:   70 year old male Height:      71 inches Weight:      194.50 pounds BMI:     27.23 O2 Sat:      97 % on Room air Temp:     97.9 degrees F oral Pulse rate:   60 / minute BP sitting:   122 / 84  (right arm) Cuff size:   regular  Vitals Entered By: Randell Loop CMA (August 10, 2009 11:28 AM)  O2 Sat at Rest %:  97 O2 Flow:  Room air CC: 16 month ROV & review of mult medical problems... Is Patient Diabetic? No Pain Assessment Patient in pain? no      Comments MEDS UPDATED TODAY   Physical Exam  Additional Exam:  WD, WN, 70 y/o [place] in  [place]... GENERAL:  Alert & oriented; pleasant & cooperative. HEENT:  [place]/AT, EOM-wnl, PERRLA, Fundi-benign, EACs-clear, TMs-wnl, NOSE-clear, THROAT-clear & wnl. NECK:  Supple w/ fairROM; no JVD; normal carotid impulses w/o bruits; no thyromegaly or nodules palpated; no lymphadenopathy. CHEST:  Clear to P & A; without wheezes/ rales/ or rhonchi. HEART:  Regular Rhythm; without murmurs/ rubs/ or gallops. ABDOMEN:  Soft & nontender; normal bowel sounds; no organomegaly or masses detected. EXT: without deformities or arthritic changes; no varicose veins/ venous insuffic/ or edema... NEURO:  CN's intact; motor testing normal; sensory testing normal; gait normal & balance OK. DERM:  round patch on right lat thigh area- ?ring worm ?dermatitis- try rx w/ Lotrisone cream.    CXR  Procedure date:  06/25/2009  Findings:       CHEST - 2 VIEW Comparison: 05/04/2007    Findings: Trachea is midline.  Heart size normal.  Right   hemidiaphragm is elevated with scarring in the adjacent right lower   lobe.  Lungs are otherwise clear.  No pleural fluid. Reversal of   the normal thoracic kyphosis.    IMPRESSION:   No acute  findings.    Read By:  Reyes Ivan.,  M.D.   MISC. Report  Procedure date:  06/20/2009  Findings:      BMP (METABOL)   Sodium                    142 mEq/L                   135-145   Potassium                 4.0 mEq/L                   3.5-5.1   Chloride                  105 mEq/L                   96-112   Carbon Dioxide            31 mEq/L                    19-32   Glucose                   86 mg/dL                    66-44   BUN                       17 mg/dL                    0-34   Creatinine                1.1 mg/dL                   7.4-2.5   Calcium                   10.0 mg/dL                  9.5-63.8   GFR                       85.44 mL/min                >60  CBC Platelet w/Diff (CBCD)   White Cell Count          5.2 K/uL                    4.5-10.5    Red Cell Count            5.77 Mil/uL                 4.22-5.81   Hemoglobin           [H]  18.1 H g/dL                 75.6-43.3   Hematocrit           [H]  54.3 H %                    39.0-52.0   MCV                       94.1 fl  78.0-100.0   Platelet Count            175.0 K/uL                  150.0-400.0   Neutrophil %              62.1 %                      43.0-77.0   Lymphocyte %              27.3 %                      12.0-46.0   Monocyte %                6.3 %                       3.0-12.0   Eosinophils%              3.2 %                       0.0-5.0   Basophils %               1.1 %                       0.0-3.0  Hepatic/Liver Function Panel (HEPATIC)   Total Bilirubin           0.9 mg/dL                   1.4-7.8  Direct Bilirubin          0.1 mg/dL                   2.9-5.6   Alkaline Phosphatase      66 U/L                      39-117   AST                  [H]  44 U/L                      0-37   ALT                       49 U/L                      0-53   Total Protein             7.9 g/dL                    2.1-3.0   Albumin                   4.2 g/dL                    8.6-5.7   TSH (TSH)   FastTSH                   1.13 uIU/mL                 0.35-5.50   Impression & Recommendations:  Problem # 1:  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  Problem # 2:  HYPERTENSION, SEVERE (ICD-401.0)  Controlled on meds...needs to take meds regularly & we discussed this. His updated medication list for this problem includes:    Lisinopril 20 Mg Tabs (Lisinopril) .Marland Kitchen... Take 1 tablet by mouth once a day    Amlodipine Besylate 10 Mg Tabs (Amlodipine besylate) .Marland Kitchen... 1 tab daily    Labetalol Hcl 200 Mg Tabs (Labetalol hcl) .Marland Kitchen... 2 tabs twice daily    Hydralazine Hcl 25 Mg Tabs (Hydralazine hcl) .Marland Kitchen... 1 tab twice daily    Furosemide 40 Mg Tabs (Furosemide) .Marland Kitchen... 1 tab daily  Problem # 3:  CEREBROVASCULAR DISEASE  (ICD-437.9) Advised ASA 81mg /d, take BP meds regularly, monitor BP at home...  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 rec- start exercise program as we discussed....  Problem # 5:  LIVER FUNCTION TESTS, ABNORMAL (ICD-794.8) Just min elevation SGOT... no alcohol, no drugs, etc... keep weight down.  Problem # 6:  COLONIC POLYPS (ICD-211.3) He is up to date on his Q26yr colonoscopies...  Problem # 7:  PSA, INCREASED (ICD-790.93) Followed by Thermon Leyland Q23mo per pt hx... we don't have notes...  Problem # 8:  OTHER MEDICAL PROBLEMS AS NOTED>>>  Complete Medication List: 1)  Aspirin 325 Mg Tabs (Aspirin) .... Take 1 tablet by mouth once a day 2)  Lisinopril 20 Mg Tabs (Lisinopril) .... Take 1 tablet by mouth once a day 3)  Amlodipine Besylate 10 Mg Tabs (Amlodipine besylate) .Marland Kitchen.. 1 tab daily 4)  Labetalol Hcl 200 Mg Tabs (Labetalol hcl) .... 2 tabs twice daily 5)  Hydralazine Hcl 25 Mg Tabs (Hydralazine hcl) .Marland Kitchen.. 1 tab twice daily 6)  Furosemide 40 Mg Tabs (Furosemide) .Marland Kitchen.. 1 tab daily 7)  Cialis 20 Mg Tabs (Tadalafil) .... As needed 8)  Lotrisone 1-0.05 % Crea (Clotrimazole-betamethasone) .... Apply to rash two times a day...  Other Orders: Prescription Created Electronically ([id number])  Patient Instructions: 1)  Today we updated your med list- see below.... 2)  We refilled your meds for 2011... 3)  Be sure to take your BP meds every day as directed... 4)  For your Cholesterol- low fat diet & INCREASE YOUR EXERCISE!!! 5)  You should also get the PNEUMONIA vaccine & the combination TETANUS vaccine called the TDAP- you can get these at any time so give [place] a call when you decide to preceed... 6)  The FLU shots come out every fall & that is also recommended to you. 7)  Call for any questions... 8)  Please schedule a follow-up appointment in 1 year, sooner as needed... Prescriptions: LOTRISONE 1-0.05 % CREA (CLOTRIMAZOLE-BETAMETHASONE) apply to rash two  times a day...  #30gm tube x prn   Entered and Authorized by:   Michele Mcalpine MD   Signed by:   Michele Mcalpine MD on 08/10/2009   Method used:   Print then Give to Patient   RxID:   [id number] FUROSEMIDE 40 MG  TABS (FUROSEMIDE) 1 tab daily  #30 x prn   Entered and Authorized by:   Michele Mcalpine MD   Signed by:   Michele Mcalpine MD on 08/10/2009   Method used:   Print then Give to Patient   RxID:   [id number] HYDRALAZINE HCL 25 MG  TABS (HYDRALAZINE HCL) 1 tab twice daily  #60 x prn   Entered and Authorized by:   Michele Mcalpine MD   Signed by:   Michele Mcalpine MD on 08/10/2009   Method used:   Print then  Give to Patient   RxID:   [id number] LABETALOL HCL 200 MG  TABS (LABETALOL HCL) 2 tabs twice daily  #120 x prn   Entered and Authorized by:   Michele Mcalpine MD   Signed by:   Michele Mcalpine MD on 08/10/2009   Method used:   Print then Give to Patient   RxID:   [id number] AMLODIPINE BESYLATE 10 MG  TABS (AMLODIPINE BESYLATE) 1 tab daily  #30 x prn   Entered and Authorized by:   Michele Mcalpine MD   Signed by:   Michele Mcalpine MD on 08/10/2009   Method used:   Print then Give to Patient   RxID:   [id number] LISINOPRIL 20 MG TABS (LISINOPRIL) Take 1 tablet by mouth once a day  #30 x prn   Entered and Authorized by:   Michele Mcalpine MD   Signed by:   Michele Mcalpine MD on 08/10/2009   Method used:   Print then Give to Patient   RxID:   [id number]

## 2010-07-04 NOTE — Progress Notes (Signed)
Summary: talk to nurse  Phone Note Call from Patient Call back at [phone]   Caller: Patient Call For: nadel Reason for Call: Talk to Nurse Summary of Call: Pain in back so bad it "takes his breath away" also pain in ear both x 10 days, and gets worse at night, wants appt with SN or TP tomorrow, please advise. Initial call taken by: Darletta Moll,  September 04, 2009 4:49 PM  Follow-up for Phone Call        called and spoke with pt.  pt c/o pain across back under "shoulder blades."  Pt states pain started approx 1 to 2 weeks ago.  Pt states pt is constant but if he "moves a wrong way it will send shooting pains in back."  Pt denies hx of back problems and denies doing anything to have hurt his back.  pt does not have an ortho dr.  pt hasn't taken anything OTC for pain.  Please advise.Marland Kitchen  also, pt also c/o R ear pain x 10 days that is constant but worse at night.  Pt denies ringing in ears, drainage, problems with hearing, or "popping noises."  please advise.  thanks.  Aundra Millet Reynolds LPN  September 05, 9526 5:10 PM   Additional Follow-up for Phone Call Additional follow up Details #1::        please add pt on at 12 on 4-06 to see SN---pt will need cxr prior to ov with SN tomorrow.  thanks Randell Loop CMA  September 04, 2009 5:17 PM     Additional Follow-up for Phone Call Additional follow up Details #2::    called and spoke with pt.  pt aware of appt date and time with SN tomorrow. pt will come early to have cxr done prior to appt.  Aundra Millet Reynolds LPN  September 05, 4130 5:20 PM

## 2010-09-20 ENCOUNTER — Other Ambulatory Visit: Payer: Self-pay | Admitting: Pulmonary Disease

## 2010-10-15 NOTE — Letter (Signed)
April 06, 2007    Angelia Mould. Derrell Lolling, M.D.  [address]  [place], [place] [address]   RE:  [name], [name]  MRN:  [id number]  /  DOB:  05/29/1941   Dear [name]:   [name] is a 70 year old gentleman whom I have followed for  general medical purposes.  He presented to the office the other day  complaining of a knot in his right side for the last 2 weeks.  He  states it occurred while he was lifting a lawnmower.  Exam revealed a  right inguinal hernia of moderate size, and he has a moderate amount of  discomfort associated with this.  It is easily reducible, but in view of  his young age and active lifestyle, I feel surgical repair is warranted.  He has an appointment to see you in the office on April 20, 2007.   From the general medical standpoint, [name] has significant  hypertension and had a small intracranial hemorrhage in 2006 as a result  of stopping his medications.  He made a full and complete neurologic  recovery, and has been good about taking his medications ever since  then.  His current hypertensive regimen includes Cozaar 100 mg p.o.  daily; Norvasc 10 mg p.o. daily; labetalol 200 mg 2 tablets p.o. b.i.d.;  hydralazine 25 mg p.o. b.i.d.  His recent blood pressure was 130/90 and  he follows a low-salt diet as well.  He knows the importance of taking  his medications regularly.   Other medical problems include an elevated PSA which was evaluated last  year by Dr. Brunilda Payor.  His PSA has dropped from 6.13 to 5.11 on recent  check.  He also has a history of adenomatous  colon polyps followed by Dr. Russella Dar.  He has chronic persistent hepatitis  in the past with mildly elevated liver enzymes which remain in the 70  range (see enclosed records).   Thank you very much for evaluation of this nice gentleman.  I look  forward to hearing your comments.    Sincerely,      Lonzo Cloud. Kriste Basque, MD  Electronically Signed    SMN/MedQ  DD:  04/06/2007  DT: 04/06/2007  Job #: [id number]

## 2010-10-15 NOTE — Op Note (Signed)
Dustin, Hamilton NO.:  [id number]   MEDICAL RECORD NO.:  [id number]          PATIENT TYPE:  AMB   LOCATION:  DAY                          FACILITY:  [facility]   PHYSICIAN:  Ardeth Sportsman, MD     DATE OF BIRTH:  07-05-1940   DATE OF PROCEDURE:  DATE OF DISCHARGE:                               OPERATIVE REPORT   PRIMARY CARE PHYSICIAN:  __________   SURGEON:  Ardeth Sportsman, MD   ASSISTANT:  None.   PREOPERATIVE DIAGNOSIS:  Right inguinal hernia and umbilical hernia.   POSTOPERATIVE DIAGNOSIS:  Right indirect inguinal hernia and  nonincarcerated umbilical hernia.   PROCEDURE PERFORMED:  Laparoscopic right inguinal hernia and open  umbilical hernia repair with underlay mesh repair.   ANESTHESIA:  1. General anesthesia.  2. Local anesthetic in a field block around all port sites.  3. Right ilioinguinal/genitofemoral/cord nerve block.   SPECIMENS:  None.   DRAINS:  None.   ESTIMATED BLOOD LOSS:  5 ml.   COMPLICATIONS:  None.   INDICATIONS:  Mr. Cyndia Bent is a pleasant 70 year old male who has had  episodes of right groin pain and was sent for surgical consultation with  evidence of an inguinal hernia.  He also had evidence of an umbilical  hernia.  Past physiology of herniation with its risks of incarceration  and strangulation was discussed.  The options were discussed,  recommendations made for laparoscopic preperitoneal exploration with  right inguinal hernia as well as an umbilical hernia possibly with mesh.   The risks of stroke, heart attack, deep venous thrombosis, pulmonary  embolism and death were discussed.  The risks such as bleeding, need for  transfusion, wound infection and abscess, injury to the organs, hernia  recurrence, prolonged pain and other risks were discussed.  Questions  were answered.  He agreed to proceed.   OPERATIVE FINDINGS:  He had a classic indirect inguinal hernia of  moderate size in his right groin.  He had about  a 2 x 2 cm umbilical  hernia defect.   DESCRIPTION OF PROCEDURE:  Informed consent was confirmed.  The patient  underwent general anesthesia without any difficulty.  He had IV  antibiotics given just prior to surgery.  He had sequential pressure  devices active during the entire case.  He was positioned supine with  both arms tucked.  He had a Foley catheter sterilely placed.  His  abdomen was prepped and draped in sterile fashion.   Entry was gained in the abdominal wall through an infraumbilical  curvilinear incision.  A nick was made in the anterior rectus fascia  just left of midline.  A 12 mm Hassan port was placed in the  preperitoneal plane.  Capnopreperitoneum to provided good  abdominal wall inflation.  A 10 mm/30 degree scope was used to help free  the peritoneum off the anterior abdominal wall, especially in the right  lower quadrant.  Enough working space was created such to have 5 mm  ports could be placed in the right and left mid abdomen.   Attention was turned towards the right lower quadrant.  Peritoneum was  freed off laterally and inferiorly.  A window was made between the  anterior lateral bladder and the anterior lateral pelvic wall down to  near the obturator foramen.  This helped define the inguinal region.  Peritoneum could easily be seen crawling up into the internal ring  consistent with indirect inguinal hernia.  The peritoneal hernia sac was  freed off the cord structures carefully and reduced out of the inguinal  canal.  The hernia sac was peeled back as proximally as possible.  In  doing this, there was a tear of moderate size.  The tear of the  peritoneum of moderate size with exposure of intraperitoneal contents.  The peritoneal defect was closed using 3-0 Vicryl figure-of-eight stitch  using laparoscopic suturing.   A 15 x 15 cm ultra light weight (Ultrapro) polypropylene mesh was cut to  a half-skull shape and placed in the preperitoneal space  in the right  lower quadrant.  A medial and inferior flap rested in the true pelvis  between the anterior and medial bladder and anterior to the pelvis.  Mesh laid well proximally, laterally and superiorly such that it is at  least 3 inches circumferential coverage around the internal ring and to  cover any potential femoral, direct, and obturator defects.  The hernia  sac and peritoneum was grasped and elevated anteriorly and slightly  cephalad.  Capnopreperitoneum was released.  Ports were removed.   Attention was turned toward umbilical hernia repair.  Circumferential  dissection was made around the umbilical stalk, and it was removed and  exposed the hernia defect.  A 6.5 cm Proceed ventral hernia patch was  placed in the preperitoneal plane.  It was secured to the fascia using  #1 Ethibond interrupted stitches x6 in a circumferential pattern and  taking bites of mesh with it.  The umbilical defect was closed in  transverse fashion using interrupted #1 Ethibond stitches.  The center  one was used to help tack the umbilical stalk down.  Skin was closed  using 4-0 Monocryl stitch.  Sterile dressing was applied.  The patient  was extubated and brought to the recovery room in stable condition.   I explained the operative findings with the patient's family.  I  discussed with the patient postoperative care just prior to surgery and  the family right after surgery.  They expressed their understanding and  appreciation.      Ardeth Sportsman, MD  Electronically Signed     SCG/MEDQ  D:  06/24/2007  T:  06/24/2007  Job:  [id number]

## 2010-10-15 NOTE — H&P (Signed)
Dustin Hamilton NO.:  [id number]   MEDICAL RECORD NO.:  [id number]          PATIENT TYPE:  EMS   LOCATION:  ED                           FACILITY:  [facility]   PHYSICIAN:  Lonzo Cloud. Kriste Basque, MD     DATE OF BIRTH:  04/18/1941   DATE OF ADMISSION:  05/04/2007  DATE OF DISCHARGE:                              HISTORY & PHYSICAL   CHIEF COMPLAINT:  Chest pain and presyncope.   HISTORY OF PRESENT ILLNESS:  This is a very pleasant 70 year old African-  American male patient who is followed by Dr. Alroy Dust for his  internal medicine needs.  He presents, today, to the [facility]  Emergency Room with chief complaint of dizziness and chest pain.  [name] reports that he was in his usual state of state of health until  this morning at which time he was at [employer] having some pictures  blown up in the photo department when he developed sudden onset of  lightheadedness and chest pain.  He described the chest pain primarily  focused in the left sternal/substernal area.  He stated that it was  nonradiating, and denied associated nausea or diaphoresis.  He rated his  pain of approximately 5-6/10 at time of onset.  He did not note that it  was associated with significant exertion.  But did initially sit down  and rest, as he felt he was going to pass-out if he did not.  He stated  that the pain lasted approximately 15-20 minutes before it resolved  spontaneously with rest.  He was able to drive himself to the [facility] Emergency Room.  He notes that upon arrival he was pain free;  however, he did sleep for an uncertain amount of time in the car prior  to actually walking into the emergency room.  He stated that upon  arrival to the emergency room he remained lightheaded; however, he was,  in fact, pain free.  His initial cardiac markers are negative.  His  chest x-ray showed chronic right hemidiaphragm elevation.  He denied  cough, fever, chills, headache, denied  lower extremity swelling, or any  other pertinent positives of date.  Of note, he had been scheduled for  an elective hernia repair on May 03, 2007; however, this was  cancelled as his blood pressure was noted to be 184/110; and the  anesthesiologist told him that he had an abnormal EKG.   Upon further evaluation and questioning Mr. Mehring does tell me that  he takes his medications routinely; however, he does not take them as  prescribed.  He is on the very complex antihypertensive regimen  consisting of Cozaar, labetalol, hydralazine, and furosemide.  Of which  he tells me that he routinely takes his labetalol and hydralazine both  scheduled as twice daily dosing, typically at night only.  He said that,  today, he actually took the labetalol and hydralazine both, as  prescribed, this morning.  He will be admitted for further evaluation,  to rule out acute coronary syndrome, as well as to evaluate his  antihypertensives.   PAST  MEDICAL HISTORY:  1. Hypertension  2. Inguinal and ventral hernia.  3. Mild obesity, weight 193.  4. 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   SOCIAL HISTORY:  Retired, worked in [employer], he stopped smoking  in 1983 and he has not had alcohol since 1963.  Denies recreational drug  use.   FAMILY HISTORY:  Positive for coronary artery disease, positive for  diabetes, positive for hypertension, and positive for hyperlipidemia.   ALLERGIES:  He is allergic to PENICILLIN.   HOME MEDICATIONS INCLUDE:  1. Aspirin 325 mg daily.  2. Cozaar 1 mg daily.  3. Labetalol 200 mg, 2 tablets twice daily.  4. Hydralazine 25 mg 1 tablet twice daily.  5. Furosemide unknown dose.   REVIEW OF SYSTEMS:  Per HPI.   PHYSICAL EXAMINATION:  VITAL SIGNS:  Temperature 97.9, heart rate 50s-  60s sinus rhythm, blood pressure 133/77, respirations 16, saturations  98% on 1 liter nasal cannula.  GENERAL: This is a well-developed 70 year old  African American male  sitting up in the bed with no acute distress.  HEENT:  Normocephalic, mucous membranes are moist.  There is no JVD or  adenopathy.  PULMONARY:  Clear to auscultation.  CARDIAC:  Regular rate and rhythm without murmur, rub or gallop.  EXTREMITIES:  Demonstrate brisk capillary refill, no edema, and are warm  to palpation.  ABDOMEN:  Nontender.  Positive bowel sounds.  No organomegaly.  Positive  ventral hernia noted.  GENITOURINARY:  Due to void.  NEUROLOGICALLY:  Intact.   Chest x-ray demonstrates chronic right elevated hemidiaphragm.   LABORATORY DATA:  EKG first-degree heart block and nonspecific changes.  Hemoglobin 16.2, hematocrit 46.2, white blood cell count 5, platelet  152.  Sodium 140, potassium 4.2, chloride 105, CO2 30, BUN 15,  creatinine 1.25, glucose 101, CK-MB is 23.4; troponin I is 0.05.   IMPRESSION/PLAN:  1. Chest pain with associated presyncopal event.  Initial cardiac      enzymes are negative.  EKG with nonspecific changes and first-      degree heart block.  Of note, he has been taking double dosing of      his labetalol and hydralazine; so uncertain as to whether this      could have been relative hypotension/orthostatic-type symptoms.      Plan for this is to admit to a regular telemetry unit, resume      aspirin and home medications, cycle cardiac      enzymes.   Cardiology will evaluate to rule out ACS, as well      as evaluate cardiac risk factors, we will repeat EKG in the      morning, ordered a 2-D echocardiogram, and finally will continue      close monitoring of blood pressure on prescribed blood pressure      regimen.      Zenia Resides, NP      Lonzo Cloud. Kriste Basque, MD  Electronically Signed    PB/MEDQ  D:  05/04/2007  T:  05/05/2007  Job:  [id number]   cc:   Lonzo Cloud. Kriste Basque, MD  [address]  [place]  [place] [address]

## 2010-10-15 NOTE — H&P (Signed)
NAMEGEN, CLAGG NO.:  [id number]   MEDICAL RECORD NO.:  [id number]          PATIENT TYPE:  EMS   LOCATION:  ED                           [facility]AC[facility]   PHYSICIAN:  Rollene Rotunda, MD, FACCDATE OF BIRTH:  1940-10-15   DATE OF ADMISSION:  05/04/2007  DATE OF DISCHARGE:                              HISTORY & PHYSICAL   PRIMARY CARDIOLOGIST:  New, being seen by Dr. Rollene Rotunda.   PRIMARY CARE PHYSICIAN:  Lonzo Cloud. Kriste Basque, MD   We were asked to consult on this 70 year old African American gentleman  with no known history of coronary artery disease.  He presented to  [facility] Emergency Room  today complaining of chest discomfort in the  setting of dizziness.   Mr. Preis is a very pleasant gentleman.  He states up until this  morning he has felt very well; never has experienced any discomfort in  his chest.  He states he does not even know what indigestion feels like.  Apparently he was being worked up for a hernia repair on Monday.  The  anesthesiologist was concerned about his blood pressure and EKG;  declined to do the procedure at that time and recommended the patient  follow up for a stress test before proceeding with surgery.   The patient states he drove to Dr. Jodelle Green office this morning to  arrange to have a stress test done.  He left there and went to [employer]; was working on some pictures there when he suddenly became dizzy  and had some substernal chest discomfort.  He described it as a  heaviness; it was localized and nonradiating.  He rated it as 5-6 a  scale of 1-10.  It was associated with the dizziness and also associated  with some shortness of breath; otherwise no associated symptoms.  He  states he sat down; the dizziness continued.  He felt that he had to get  home; so he got up from his sitting position holding onto various items  in the store.  He made his way out the door to his car.  He continued to  feel badly.  He  thought about calling  9-1-1, but then decided he would  drive himself to the emergency room.  He got to [facility],  parked, called his wife and told here what was going on.  Apparently the  chest discomfort eased off, but the dizziness returned.  He thought he  would sit there for a minute; the next thing he knew he fell asleep.  He  states the chest pain lasted all of 15 minutes.  He woke up and the  dizziness continued, so he decided to come in and get checked out.   The only thing different for Mr. Gladu, he states that for the last 3  weeks he has been taking his b.i.d. medicines all at one time at night;  because when he takes them in the morning he gets nauseated.   ALLERGIES:  Include PENICILLIN.   MEDICATIONS:  These are not quite clear at this time.  He states he  takes 5 blood pressure medicines and one aspirin.  Medications being:  1. Cozaar 100 mg daily.  2. Norvasc 10 mg daily.  3. Lasix 40 mg daily.  4. Labetalol 200 mg, supposed to be b.i.d.; instead he is taking 400      mg at night.  The other blood pressure medication he is not sure what it is.  He  thinks it begins with an A.   PAST MEDICAL HISTORY:  1. Hypertension, with a history of poor compliance -- resulting in an      intraparenchymal hemorrhage in 2006.  Management was for      conservative treatment at that time, with blood pressure control.  2. 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  3. Chronic persistent hepatitis, secondary to fatty infiltration of      the liver -- secondary to previous alcohol and drug abuse.  The      patient states his last alcohol intake was March 15, 1962.  His      last tobacco intake was December 14, 1981.  4. History of retinal hemorrhage.  5. History of BPH.  6. Supposedly had an echocardiogram at some point in the past; results      are not available at this time.   SOCIAL HISTORY:  Lives in [place] with his wife.  He is an  IT trainer.  He has one adult child and one grandchild.  Alcohol and tobacco use as stated above.   FAMILY HISTORY:  Positive for CAD on his mother's side.  She also  apparently had a history of hypertension and diabetes; was on  hemodialysis prior to her death.  Father's history is unknown.   REVIEW OF SYSTEMS:  Positive for dizziness and chest pain, shortness of  breath as described above.  Otherwise, the patient denies any other  symptoms.   PHYSICAL EXAMINATION:  Temperature 97.3, heart rate 51-76, respirations  18-20, blood pressure 113/58, saturations 99% on 2 liters.  GENERAL:  Generally well-appearing; no acute distress.  HEENT:  Normocephalic, atraumatic.  Pupils equal, round, reactive to  light.  NECK:  Supple without lymphadenopathy, no bruits.  No JVD.  LUNGS: Clear to auscultation bilaterally.  CARDIOVASCULAR:  Exam reveals S1-S2 regular rate and rhythm.  SKIN:  Warm and dry.  ABDOMEN:  Soft, nontender.  Positive bowel sounds.  EXTREMITIES:  Lower Extremities without clubbing or cyanosis.  He has 2+  DP bilaterally.  NEUROLOGICAL:  Unremarkable.   EKG:  Sinus bradycardia with a rate of 51.  No acute ST or T-wave  changes.  The patient has a first-degree block with nonspecific  intraventricular block.   LAB WORK:  Hemoglobin 16.20, hematocrit 46.4 WBCs 5, platelets 152,000.  Sodium 140, potassium 4.2, chloride 105, CO2 30, BUN 15, creatinine 1.2,  with a glucose 101.  AST 50, ALT 56.  Point-of-care markers negative.  Troponin x2 sets.  First set of cardiac enzymes:  CK elevated at 1932,  CK-MB 262, troponin of 0.01.  PT/INR 14.1 and 1.1.   A patient with chest pain with no previous history, in the setting of  dizziness; most likely orthostatic blood pressure.  This patient is  taking medications other than as prescribed.  It is recommended that the  patient be admitted overnight for observation.  If enzymes are negative,  plan on outpatient Cardiolite in our  office in the morning.  It is okay  to proceed with a 2-D echocardiogram in our office also.  Recommend  switching the patient's blood pressure  medicines to once a day formula if possible, as he is complaining of  nausea with a.m. medications.  Also, would recommend he stagger his  medications.  I will leave this up to Dr. Kriste Basque, his primary care  physician.  The patient has been evaluated by Dr. Antoine Poche, who agrees  with the plan.      Dorian Pod, ACNP      Rollene Rotunda, MD, [facility]  Electronically Signed    MB/MEDQ  D:  05/04/2007  T:  05/05/2007  Job:  [id number]   cc:   Lonzo Cloud. Kriste Basque, MD  [address]  [place]  [place] [address]

## 2010-10-18 NOTE — Discharge Summary (Signed)
Dustin Hamilton, Dustin Hamilton NO.:  [id number]   MEDICAL RECORD NO.:  [id number]          PATIENT TYPE:  INP   LOCATION:  0346                         FACILITY:  [facility]   PHYSICIAN:  Lonzo Cloud. Kriste Basque, M.D. [facility] OF BIRTH:  17-Dec-1940   DATE OF ADMISSION:  06/30/2004  DATE OF DISCHARGE:                                 DISCHARGE SUMMARY   FINAL DIAGNOSES:  1.  Admitted June 30, 2004 by Dr. Jayme Cloud with sudden onset of dizziness      and headache.  Evaluation revealed a tiny 3-4 mm intraparenchymal      hemorrhage and accelerated hypertension.  The evaluated by neurosurgery,      Dr. Yetta Barre, with recommendation for conservative management and blood      pressure control.  2.  Severe hypertension - medications restarted and adjusted this admission.      He has a history of hypertensive cardiovascular disease as well.  3.  History of elevated liver enzymes in the past.  He was evaluated by Dr.      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  Liver enzymes are normal this admission.  4.  History of hematuria with left renal cyst on evaluation by Dr. Brunilda Payor in      the past.  He had a negative cystoscopy in 2002.  He has known benign      prostatic hypertrophy.  5.  Remote history of retinal hemorrhage.  6.  Negative colonoscopy in 1998 except for hemorrhoids.  7.  Allergy to PENICILLIN.   BRIEF HISTORY AND PHYSICAL:  The patient is a 70 year old gentleman whom I  have followed for general medical purposes.  He was last seen in the office  in April 2005, taking four blood pressure medications with good control and  a blood pressure 130/80.  Apparently, the patient stopped his blood pressure  medicine and on the day of admission developed sudden onset of  disequilibrium and headache.   He presented to the emergency room where he  was evaluated by the emergency room physician, found to have severe  hypertension with a blood pressure of 218/136, and a CT brain that showed a  small 3-4 mm hyperdensity in the left occipital lobe suggestive of small  parenchymal hemorrhage.  He was admitted for further evaluation and  treatment.   PAST MEDICAL HISTORY:  1.  Hypertension.  The patient had a long history of hypertension and      hypertensive cardiovascular disease.  He was previously on labetalol 200      mg p.o. b.i.d.; Norvasc 10 mg p.o. daily; Lasix 40 mg p.o. daily; and      Cozaar 100 mg p.o. daily.  His blood pressure was 130/80 when last      checked in April 2005 on these medications.  He has a history of  hypertensive cardiovascular disease with some increased left ventricular      wall thickness on previous 2-D echocardiogram.  2.  Elevated liver enzymes.  He has a history of elevated liver enzymes in      the past.  He was evaluated by Dr. Russella Dar and found to have a history of      hepatitis A in the past and antibodies to hepatitis B.  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  He has remote history of alcohol and drug abuse.      Liver enzymes were rechecked this hospitalization and were negative.  3.  History hematuria.  He has a history of hematuria with evaluation by Dr.      Brunilda Payor in the past.  He had a negative cysto in 2002.  His IVP showed a      left renal cyst.  He also has BPH.  4.  History of retinal hemorrhage - evaluated by ophthalmology in the past.  5.  History of colonoscopy in 1998 with hemorrhoids only.  6.  History of allergy to PENICILLIN.   I should note that previous CT of the brain in 2004 showed small vessel  disease and was otherwise negative.  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   PHYSICAL EXAMINATION:  Physical examination at the time  of admission  revealed a 70 year old gentleman in mild distress with hypertension.  Blood  pressure in the ER 218/136, pulse 84 per minute and regular, respirations 18  per minute and not labored, temperature 97 degrees.  HEENT exam was  unremarkable.  Fundi could not be well seen.  Neck exam was supple; no  jugular venous distention, no carotid bruits, no thyromegaly or  lymphadenopathy.  Chest was clear to percussion and auscultation.  Cardiac  exam revealed a regular rate and rhythm, normal S1 and S2, without murmurs,  rubs, or gallops detected.  The abdomen was soft, bowel sounds were intact.  There was no evidence of hepatosplenomegaly.  Extremities showed no  cyanosis, clubbing, or edema.  Neurologic exam was grossly intact without  acute abnormalities detected.   LABORATORY DATA:  EKG showed a normal sinus rhythm with first degree A-V  block, left axis deviation, and nonspecific ST-T wave changes.  The cardiac  monitor showed all normal sinus rhythm.  Chest x-ray showed some mild  basilar atelectasis and chronic elevation of the right hemidiaphragm;  otherwise, negative.  CT of the brain revealed chronic small vessel disease  and a small 4 mm parenchymal hemorrhage in the left occipital lobe.  A  follow-up CT showed everything to be stable.  Hemoglobin 18.7, repeat 16.4,  hematocrit 55.7, white count 4500 with a normal differential.  Sodium 140,  potassium 3.7, chloride 107, CO2 28, BUN 12, creatinine 1.0, blood sugar  134.  Calcium 9.4, total protein 6.4, albumin 3.1, AST 32, ALT 35, alk phos  47, total bilirubin 0.8.  Hemoglobin A1c 5.7.  CPK elevated at 1500 with  negative MB and negative troponin.  This was felt to be secondary to some  mild rhabdomyolysis.  Cholesterol 128, triglycerides 133, HDL 27, LDL 78.  TSH 0.38.  PSA 3.06.  Drug screen was negative on admission.  Urinalysis was  clear.  HOSPITAL COURSE:  The patient was admitted with severe hypertension and a  small  occipital hemorrhage.  He was monitored in  the ICU and placed on a  Cardene drip for blood pressure control.  He was seen in consultation by Dr.  Yetta Barre of the neurosurgery service.  He agreed with conservative management  and vigorous blood pressure control.  He was given Antivert as needed for  dizziness and Zofran as needed for nausea.  The patient's symptoms resolved  quickly.  His blood pressure came under good control with the Cardene and he  was started back on his oral regimen including labetalol, Norvasc, Lasix,  and Cozaar.  Doses were adjusted with improvement in his blood pressure.  As  we weaned the Cardene drip we found we had to add hydralazine 25 mg p.o.  q.i.d. to maintain control of his blood pressure.  He was transferred out of  intensive care and onto the ward where he ambulated and demonstrated good  blood pressure control on a five-drug regimen.  He was felt to be MHB and  ready for discharge on July 05, 2004.   MEDICATIONS AT DISCHARGE:  1.  Labetalol 200 mg two tablets p.o. b.i.d.  2.  Norvasc 10 mg one p.o. daily.  3.  Lasix 40 mg p.o. q.a.m.  4.  Cozaar 100 mg p.o. daily.  5.  Apresoline 25 mg p.o. q.i.d.   He was recommended to try Prilosec for any stomach acid and Tylenol as  needed for pain.  He was instructed on a low salt diet.  Activity as  tolerated.  He was carefully counseled to take his medication regularly to  avoid recurrent hypertensive crises and the risk of a severe stroke.  The  patient understands this and has stated he will comply.   The patient instructed to return to the office on July 11, 2004 at 12:30  p.m. for a follow-up visit.   CONDITION AT DISCHARGE:  Improved.      SMN/MEDQ  D:  07/05/2004  T:  07/05/2004  Job:  [id number]

## 2010-10-18 NOTE — Op Note (Signed)
[facility]  Patient:    Dustin Hamilton, Dustin Hamilton                   MRN: [id number] Proc. Date: 03/05/00 Adm. Date:  [id number] Disc. Date: [id number] Attending:  Devoria Albe                           Operative Report  PREOPERATIVE DIAGNOSIS:  Gross hematuria and incomplete filling of the left collecting system on intravenous pyelogram.  POSTOPERATIVE DIAGNOSIS:  Gross hematuria.  PROCEDURE PERFORMED:  Cystoscopy, left retrograde pyelogram, washings for cytology, and ureteroscopy.  SURGEON:  Lindaann Slough, M.D.  ANESTHESIA:  General.  INDICATIONS:  The patient is a 70 year old male who had gross painless hematuria.  An IVP showed normal right kidney and incomplete filling of the lower pole calyx of the left kidney.  The patient is scheduled for cystoscopy and retrograde pyelogram.  DESCRIPTION OF PROCEDURE:  Under general anesthesia, the patient was prepped and draped, and placed in the dorsolithotomy position.  A #22 Wappler cystoscope was inserted in the bladder.  The patient has a trilobar prostatic hypertrophy with a large median lobe.  The bladder is moderately trabeculated. There is no stone or tumor in the bladder.  The ureteral orifices are in normal position and shape with clear efflux.  A #8-French cone-tipped catheter was passed through the cystoscope into the left ureteral orifice.  Contrast was then injected through the ureteral catheter into the left ureter.  The ureter appears normal.  The collecting system appears normal.  There is no evidence of filling defect in the ureter nor in the collecting system.  The cone-tipped catheter was then removed.  A guidewire was passed through the cystoscope into the left ureteral orifice, but the catheter could not be passed beyond the intramural ureter because of a J-hook deformity of the distal ureter.  The cystoscope and guidewire were then removed.  A #6 rigid ureteroscope was then passed in the  bladder and into the ureteral orifices. Then, a guidewire was passed under direct vision into the ureter all the way up into the collecting system.  The intramural ureter was then dilated with the ureteral balloon catheter.  The cystoscope was then removed.  The ureteroscope was then passed in the ureter.  The ureter appears normal.  There is no evidence of stone or tumor in the ureter.  A flexible ureteroscope was then passed over the guidewire, but the ureteroscope could not be advanced through the ureter or into the collecting system.  The ureteroscope was then removed.  The guidewire was back loaded into the cystoscope and an open end catheter was passed over the guidewire.  The guidewire was removed.  The __________ was then irrigated with normal saline and washings were sent for cytology.  The guidewire was then passed through the open end catheter and the open end catheter was removed.  A #6-French - 26 double-J catheter was passed over the guidewire.  The proximal end of the double-J catheter is in the collecting system.  The distal end is in the bladder.  The bladder was then emptied and the cystoscope removed.  The patient tolerated the procedure well and left the OR in satisfactory condition to the postanesthesia care unit. DD:  03/05/00 TD:  03/06/00 Job: [id number]

## 2010-10-18 NOTE — Consult Note (Signed)
Dustin Hamilton, SEGRETO NO.:  [id number]   MEDICAL RECORD NO.:  [id number]          PATIENT TYPE:  INP   LOCATION:  0155                         FACILITY:  [facility]   PHYSICIAN:  Tia Alert, MD     DATE OF BIRTH:  August 19, 1940   DATE OF CONSULTATION:  06/30/2004  DATE OF DISCHARGE:                                   CONSULTATION   CHIEF COMPLAINT:  Intracerebral hemorrhage.   HISTORY OF PRESENT ILLNESS:  Dustin Hamilton is a 70 year old black male who  was admitted through the emergency department with a hypertensive crisis,  headache, and dizziness.  He was at church this morning when he noted the  onset of dizziness.  He was brought to the emergency department, where he  had a blood pressure of 218/136.  He was treated with IV medications and  when his blood pressure normalized his symptoms improved.  He had a head CT,  which showed a small punctate hyperdensity in the left occipital white  matter and a neurosurgical consultation was requested.  The patient denies  any headache, nausea and vomiting, numbness, tingling, or weakness at this  time.  He has no dizziness at this time.   PAST MEDICAL HISTORY:  1.  Hypertension.  2.  Inguinal hernia repair.  3.  The patient states he has been noncompliant with his antihypertensives.   MEDICATIONS:  1.  Cozaar.  2.  Norvasc.  3.  Lasix.  Again he has been noncompliant with his medications.   ALLERGIES:  PENICILLIN.   SOCIAL HISTORY:  Remote smoking history.  He denies alcohol use.   FAMILY HISTORY:  Noncontributory.   PHYSICAL EXAMINATION:  VITAL SIGNS:  His blood pressure is 136/85, pulse is  85, respirations 20.  GENERAL:  A very pleasant cooperative black male in no acute distress.  HEENT:  Normocephalic atraumatic.  Extra ocular movements are intact.  NECK:  Supple.  HEART:  Regular rate and rhythm.  NEUROLOGIC:  He is awake and alert.  He is oriented x4.  He has no aphasia.  He has a good attention span.   His fund of knowledge and memory appear to be  appropriate.  He has no facial asymmetry.  His tongue protrudes in midline.  His extra ocular movements are intact, as stated.  His visual fields appear  to be full to non-formal confrontation and he can read a chart across the  room at about 15 feet quite easily.  His gait is not tested but his strength  is good throughout with good muscle tone and bulking.  He has no pronator  drift.  Sensation is grossly intact throughout.   IMAGING STUDIES:  A CT scan of the brain I have reviewed as well as the  report.  It shows a tiny hyperdensity in the left occipital white matter  without surrounding mass effect or edema.   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  I agree with his repeat head CT in the morning.  If he has  any change in his neurological exam or in the head CT, please call me.  Otherwise I feel he needs no neurosurgical intervention.  I would control  his blood pressure as you are doing.  I also stressed that he be compliant  be with his medications upon discharge and he has demonstrated  understanding.   I want to thank you for this consult.      DSJ/MEDQ  D:  06/30/2004  T:  06/30/2004  Job:  [id number]

## 2010-11-26 ENCOUNTER — Encounter: Payer: Self-pay | Admitting: Adult Health

## 2010-11-27 ENCOUNTER — Encounter: Payer: Self-pay | Admitting: Adult Health

## 2010-11-27 ENCOUNTER — Ambulatory Visit (INDEPENDENT_AMBULATORY_CARE_PROVIDER_SITE_OTHER): Payer: Medicare Other | Admitting: Adult Health

## 2010-11-27 DIAGNOSIS — M549 Dorsalgia, unspecified: Secondary | ICD-10-CM

## 2010-11-27 MED ORDER — MELOXICAM 15 MG PO TABS: 15.0000 mg | ORAL_TABLET | Freq: Every day | ORAL | Status: DC | PRN

## 2010-11-27 MED ORDER — TADALAFIL 20 MG PO TABS: 20.0000 mg | ORAL_TABLET | Freq: Every day | ORAL | Status: DC | PRN

## 2010-11-27 NOTE — Progress Notes (Signed)
  Subjective:    Patient ID: Dustin Hamilton, male    DOB: 06-06-1940, 69 y.o.   MRN: [id number]  HPI 70 yo male with known hx of HTN, PVD, and BPH  11/27/10 Acute OV  Pt presents for a work in visit. Complains of mid-back pain on right side x2days. Worse with walking .  Motrin helps w/ pain relief No urinary issues. No known injury  He has a known hx of back pain with previous . 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 NO known injury. Has been doing some lifting. No leg weakness . Pain w/ bending.     Review of Systems Constitutional:   No  weight loss, night sweats,  Fevers, chills, fatigue, or  lassitude.  HEENT:   No headaches,  Difficulty swallowing,  Tooth/dental problems, or  Sore throat,                No sneezing, itching, ear ache, nasal congestion, post nasal drip,   CV:  No chest pain,  Orthopnea, PND, swelling in lower extremities, anasarca, dizziness, palpitations, syncope.   GI  No heartburn, indigestion, abdominal pain, nausea, vomiting, diarrhea, change in bowel habits, loss of appetite, bloody stools.   Resp: No shortness of breath with exertion or at rest.  No excess mucus, no productive cough,  No non-productive cough,  No coughing up of blood.  No change in color of mucus.  No wheezing.  No chest wall deformity  Skin: no rash or lesions.  GU: no dysuria, change in color of urine, no urgency or frequency.  No flank pain, no hematuria   MS:  No joint pain or swelling.   Psych:  No change in mood or affect. No depression or anxiety.  No memory loss.          Objective:   Physical Exam GEN: A/Ox3; pleasant , NAD, elderly  HEENT:  [place]/AT,  EACs-clear, TMs-wnl, NOSE-clear, THROAT-clear, no lesions, no postnasal drip or exudate noted.   NECK:  Supple w/ fair ROM; no JVD; normal carotid impulses w/o bruits; no thyromegaly or nodules palpated; no lymphadenopathy.  RESP  Clear  P & A; w/o, wheezes/ rales/ or rhonchi.no accessory muscle use, no  dullness to percussion  CARD:  RRR, no m/r/g  , no peripheral edema, pulses intact, no cyanosis or clubbing.  GI:   Soft & nt; nml bowel sounds; no organomegaly or masses detected.  Musco: Warm bil, no deformities or joint swelling noted,  Tender along mid lower back , neg SLR, nml gait. No ecchymosis or redness noted.  Normal strength bilaterally   Neuro: alert, no focal deficits noted.    Skin: Warm, no lesions or rashes         Assessment & Plan:

## 2010-11-27 NOTE — Patient Instructions (Signed)
Mobic 15mg  daily with food As needed  For back/joint pain.  Alternate ice and heat  To back.  Please contact office for sooner follow up if symptoms do not improve or worsen or seek emergency care  follow up Dr. Kriste Basque  For physical in 2 months

## 2010-12-03 NOTE — Assessment & Plan Note (Signed)
Back strain  Plan Mobic 15mg  daily with food As needed  For back/joint pain.  Alternate ice and heat  To back.  Please contact office for sooner follow up if symptoms do not improve or worsen or seek emergency care  follow up Dr. Kriste Basque  For physical in 2 months

## 2010-12-14 ENCOUNTER — Other Ambulatory Visit: Payer: Self-pay | Admitting: Pulmonary Disease

## 2011-01-29 ENCOUNTER — Encounter: Payer: Self-pay | Admitting: Pulmonary Disease

## 2011-01-29 ENCOUNTER — Ambulatory Visit (INDEPENDENT_AMBULATORY_CARE_PROVIDER_SITE_OTHER): Payer: Medicare Other | Admitting: Pulmonary Disease

## 2011-01-29 DIAGNOSIS — R945 Abnormal results of liver function studies: Secondary | ICD-10-CM

## 2011-01-29 DIAGNOSIS — M549 Dorsalgia, unspecified: Secondary | ICD-10-CM

## 2011-01-29 DIAGNOSIS — E786 Lipoprotein deficiency: Secondary | ICD-10-CM

## 2011-01-29 DIAGNOSIS — R972 Elevated prostate specific antigen [PSA]: Secondary | ICD-10-CM

## 2011-01-29 DIAGNOSIS — D126 Benign neoplasm of colon, unspecified: Secondary | ICD-10-CM

## 2011-01-29 DIAGNOSIS — I10 Essential (primary) hypertension: Secondary | ICD-10-CM

## 2011-01-29 DIAGNOSIS — R319 Hematuria, unspecified: Secondary | ICD-10-CM

## 2011-01-29 DIAGNOSIS — I119 Hypertensive heart disease without heart failure: Secondary | ICD-10-CM

## 2011-01-29 DIAGNOSIS — Z87898 Personal history of other specified conditions: Secondary | ICD-10-CM

## 2011-01-29 DIAGNOSIS — F419 Anxiety disorder, unspecified: Secondary | ICD-10-CM

## 2011-01-29 DIAGNOSIS — I679 Cerebrovascular disease, unspecified: Secondary | ICD-10-CM

## 2011-01-29 MED ORDER — LABETALOL HCL 200 MG PO TABS: 200.0000 mg | ORAL_TABLET | Freq: Two times a day (BID) | ORAL | Status: DC

## 2011-01-29 MED ORDER — AMLODIPINE BESYLATE 10 MG PO TABS: 10.0000 mg | ORAL_TABLET | Freq: Every day | ORAL | Status: DC

## 2011-01-29 MED ORDER — FUROSEMIDE 40 MG PO TABS: 40.0000 mg | ORAL_TABLET | Freq: Every day | ORAL | Status: DC

## 2011-01-29 MED ORDER — LISINOPRIL 20 MG PO TABS: 20.0000 mg | ORAL_TABLET | Freq: Every day | ORAL | Status: DC

## 2011-01-29 MED ORDER — HYDRALAZINE HCL 25 MG PO TABS: 25.0000 mg | ORAL_TABLET | Freq: Two times a day (BID) | ORAL | Status: DC

## 2011-01-29 MED ORDER — MELOXICAM 15 MG PO TABS: 15.0000 mg | ORAL_TABLET | Freq: Every day | ORAL | Status: DC | PRN

## 2011-01-29 MED ORDER — TADALAFIL 20 MG PO TABS: 20.0000 mg | ORAL_TABLET | Freq: Every day | ORAL | Status: DC | PRN

## 2011-01-29 MED ORDER — IBUPROFEN 800 MG PO TABS: 800.0000 mg | ORAL_TABLET | Freq: Two times a day (BID) | ORAL | Status: DC | PRN

## 2011-01-29 NOTE — Patient Instructions (Signed)
Today we updated your meds in EPIC...    Continue your current meds the same...  Please return to our lab one morning soon for your follow up fasting blood work...    Please call the [facility] in a few days for your results...    Dial [id number] & when prompted enter your patient number followed by the # symbol...    Your patient number is:  [id number]  Call for any problems.Marland KitchenMarland Kitchen

## 2011-01-29 NOTE — Progress Notes (Signed)
Subjective:    Patient ID: Dustin Hamilton, male    DOB: 1940-09-23, 70 y.o.   MRN: [id number]  HPI 70 y/o BM here for a follow up visit... he has multiple medical problems as noted below...    ~  August 10, 2009:  he had a good year overall w/ BP controlled on 5 drug regimen & he is reminded to take them regularly... he had left sided back discomfort & saw TP 1/11- improved w/ Advil/ Skelaxin... he had f/u colonoscopy 11/09 w/ several adenomatous polyps removed & DrStark plans repeat in 32yrs... he continues to see DrNesi every 87mo & he does his PSA's (pt reports they are all "OK"... he adamantly refuses all vaccinations.  ~  September 05, 2009:  add-on appt for 2 week hx right sided back pain & right ear pain... notes intermittent (daily 5-6 x per day), severe (9/10), mid right back pain below scap tip... pain ppt by certain movements, occas relieved by other movements, lasts 1-2 min & resolves... "it ain't good" he says- hasn't taken any meds for this despite hx left sided back pain 1/11 (seen by TP & in ER- neg CXR, neg TSpine films, norm lab work)... **we discussed Rx w/ [id number] & ParafonTid regularly to decr freq & severity of the pain. Also c/o right ear pain x 2weeks- exam shows mild OM & Rx w/ antibiotics, & Auralgan gtts.  ~  January 29, 2011:  73mo ROV & he reports stable overall, notes some LBP & saw TP 6/12 w/ rec for rest, heat, anti-inflamm rx (Motrin vs Mobic) & he says improved... He has severe HBP on 5 med regimen that he has been taking faithfully he says (prev hx non-compliance w/ sm right occip hemorrhage 2006); BP= 136/84 today & he denies CP, palpit, syncope, SOB, edema, or cerebral ischemic symptoms, etc; he notes occas dizzy- intermittent, self limited, discussed rx w/ Bonine OTC;  He has hx low-HDL chol on diet management alone; note prev LFTs elev w/ hx chr persist hepatitis- he knows to avoid all hepatotoxins; note f/u colon due 11/12 per DrStark; he has elev PSA & followed by  [facility] w/ appt due next week...  He will ret for fasting blood work >>          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 ~  CXR 1/11 showed elev right hemidiaph w/ scarring, NAD...  2. HYPERTENSION, SEVERE (ICD-401.0) - long hx severe HBP and noncompliance w/ med Rx;  resulted in hospitalization w/ small ICH 1/06;  supposed to be on a 5 drug regimen:  LABETOLOL 200mg - 2tabsBid,  HYDRALAZINE 25mg Bid,  AMLODIPINE 10mg /d,  COZAAR 100mg /d,  LASIX 40mg /d...  BP= 136/84, & similar at home- taking meds regularly he says, & tolerating well... denies HA, fatigue, visual changes, CP, palipit, syncope, dyspnea, edema, etc...   3. 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 ~  2D Echo 1/06 showed mod incr LV wall thickness... ~  repeat 2DEcho 12/08 showed mild incr LV wall thickness, norm LVF w/ EF= 65%...  4. PERS HX NONCOMPLIANCE W/MED TX PRS HAZARDS HLTH (ICD-V15.81) - BP's have been adeq controlled when he takes his meds regularly... we discuss medication compliance at each & every OV.  5. CEREBROVASCULAR DISEASE (ICD-437.9) - MRIBrain 1/06 showed small Rt occipital hem, mild atrophy, & chr sm vessel ischemic dz...  6. Hx of INTRACRANIAL HEMORRHAGE (ICD-432.9)  7. 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 he is counselled on a low chol/ low fat diet & exercise program to help his HDL... ~  FLP 11/08 showed TChol 133, TG 103, HDL 25, LDL 87 ~  FLP 11/09 showed TChol 113, TG 100, HDL 24, LDL 69... rec> incr exercise. ~  FLP 9/12 ==> pending  8. LIVER FUNCTION TESTS, ABNORMAL (ICD-794.8) - Hx prev GI evals:  Remote hx Etoh & drug abuse;  Hx HepA 1960's; GI eval 1979 DrWeissman w/ + HepBSAg & liver bx showing Chr Persist Hepatitis;  GI eval DrStark 2004 w/ prob fatty infiltration of liver;  LFT's ~ 70/70 11/08; ~  labs 10/09 showed SGOT= 52, SGPT= 54... rec> no  hepatotoxins, diet, keep wt down. ~  labs 1/11 showed SGOT= 44, SGPT= 49 ~  Labs 9/12 ==> pending  9. 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 ~  colonoscopy 11/09 by DrStark showed mult polyps 4-18mm size (adenomatous on bx), f/u 34yrs.  10. HEMORRHOIDS (ICD-455.6)  11. INGUINAL HERNIA, RIGHT (ICD-550.90) >> repaired by drGross, CCS in Jan09...  12. PSA, INCREASED (ICD-790.93) - eval by DrNesi for Urology & pt reports that he is checked every 57mo... ~  labs 6/07 showed PSA= 6.13,   ~  labs 11/08 showed PSA= 5.11 ~  we don't have follow up notes from DrNesi  13. BENIGN PROSTATIC HYPERTROPHY, HX OF (ICD-V13.8)  14. Hx of HEMATURIA UNSPECIFIED (ICD-599.70) - s/p neg cystoscopy 10/01...  15. RENAL CYST (ICD-593.2)  16. 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 BACK PAIN (ICD-724.5) - hx fluctuating left/ right sides back pains... TSpine films 1/11 w/ spondylitic changes, athropathy, etc... He takes MOTRIN 800mg  Prn...   Past Surgical History  Procedure Date  . Cystoscopy and retrograde pyleogram   . Inguinal hernia repair   . Umbilical hernia repair     Outpatient Encounter Prescriptions as of 01/29/2011  Medication Sig Dispense Refill  . amLODipine (NORVASC) 10 MG tablet Take 10 mg by mouth daily.        Marland Kitchen aspirin 325 MG tablet Take 325 mg by mouth daily.        . chlorzoxazone (PARAFON) 500 MG tablet Take 500 mg by mouth 3 (three) times daily as needed.        . furosemide (LASIX) 40 MG tablet Take 40 mg by mouth daily.        . hydrALAZINE (APRESOLINE) 25 MG tablet TAKE ONE TABLET BY MOUTH TWICE DAILY  60 tablet  2  . labetalol (NORMODYNE) 200 MG tablet TAKE TWO TABLETS BY MOUTH TWICE DAILY  120 tablet  2  . lisinopril (PRINIVIL,ZESTRIL) 20 MG tablet TAKE ONE TABLET BY MOUTH EVERY DAY  90 tablet  3  . meloxicam (MOBIC) 15 MG tablet Take 1 tablet (15 mg total) by mouth daily as  needed.  30 tablet  5  . tadalafil (CIALIS) 20 MG tablet Take 1 tablet (20 mg total) by mouth daily as needed.  10 tablet  11    Allergies  Allergen Reactions  . Penicillins     REACTION: hives    Current Medications, Allergies, Past Medical History, Past Surgical History, Family History, and Social History were reviewed in [employer] record.     Review of Systems         See HPI - all other systems neg except as  noted...  The patient complains of decreased hearing, dyspnea on exertion, and difficulty walking.  The patient denies anorexia, fever, weight loss, weight gain, vision loss, hoarseness, chest pain, syncope, peripheral edema, prolonged cough, headaches, hemoptysis, abdominal pain, melena, hematochezia, severe indigestion/heartburn, hematuria, incontinence, muscle weakness, suspicious skin lesions, transient blindness, depression, unusual weight change, abnormal bleeding, enlarged lymph nodes, and angioedema.   Objective:   Physical Exam      WD, WN, 70 y/o BM in NAD... GENERAL:  Alert & oriented; pleasant & cooperative. HEENT:  [place]/AT, EOM-wnl, PERRLA, EACs-clear, TMs- red sl bulge on right, NOSE-clear, THROAT-clear & wnl. NECK:  Supple w/ fairROM; no JVD; normal carotid impulses w/o bruits; no thyromegaly or nodules palpated; no lymphadenopathy. CHEST:  Clear to P & A; without wheezes/ rales/ or rhonchi. HEART:  Regular Rhythm; without murmurs/ rubs/ or gallops. ABDOMEN:  Soft & nontender; normal bowel sounds; no organomegaly or masses detected. EXT: without deformities or arthritic changes; no varicose veins/ venous insuffic/ or edema... BACK:  no lesions seen, non-tender to palp or motion of arm/ shoulder... NEURO:  CN's intact; motor testing normal; sensory testing normal; gait normal & balance OK. DERM:  round patch on right lat thigh area- ?ring worm ?dermatitis- try rx w/ Lotrisone cream.   Assessment & Plan:   HBP & HCVD>  Hx severe HBP on  5 drug regimen & he is reminded to take all meds every day & check BP at home to be sure control is adeq; continue same.  Hx ICH in 2006 related to medication non-compliance; known sm vessel dis & mild atrophy on scan...  LOW-HDL Chol>  Oddly his TChol looks great 7 LDL is ok on diet alone; he is encouraged to incr exercise, etc...  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  Colon Polyps>  Due for f/u colon w/ DrStark 11/12 & we will refer...  Elev PSA>  Followed by Thermon Leyland every 37mo & he has appt next week (we don't have recent notes).  Hx Back Pain>  We refilled his Motrin 800mg  per request..Marland Kitchen

## 2011-01-30 ENCOUNTER — Encounter: Payer: Self-pay | Admitting: Gastroenterology

## 2011-02-17 ENCOUNTER — Ambulatory Visit: Payer: Medicare Other

## 2011-02-17 DIAGNOSIS — F419 Anxiety disorder, unspecified: Secondary | ICD-10-CM

## 2011-02-17 DIAGNOSIS — R972 Elevated prostate specific antigen [PSA]: Secondary | ICD-10-CM

## 2011-02-17 DIAGNOSIS — R319 Hematuria, unspecified: Secondary | ICD-10-CM

## 2011-02-17 DIAGNOSIS — R945 Abnormal results of liver function studies: Secondary | ICD-10-CM

## 2011-02-17 DIAGNOSIS — D126 Benign neoplasm of colon, unspecified: Secondary | ICD-10-CM

## 2011-02-17 DIAGNOSIS — M549 Dorsalgia, unspecified: Secondary | ICD-10-CM

## 2011-02-17 DIAGNOSIS — I10 Essential (primary) hypertension: Secondary | ICD-10-CM

## 2011-02-17 DIAGNOSIS — E786 Lipoprotein deficiency: Secondary | ICD-10-CM

## 2011-02-17 LAB — CBC WITH DIFFERENTIAL/PLATELET
Basophils Relative: 0.5 % (ref 0.0–3.0)
Eosinophils Absolute: 0.1 10*3/uL (ref 0.0–0.7)
Lymphocytes Relative: 21.5 % (ref 12.0–46.0)
MCHC: 33.8 g/dL (ref 30.0–36.0)
Monocytes Relative: 6.2 % (ref 3.0–12.0)
Neutrophils Relative %: 69.1 % (ref 43.0–77.0)
RBC: 5.39 Mil/uL (ref 4.22–5.81)
WBC: 5 10*3/uL (ref 4.5–10.5)

## 2011-02-17 LAB — HEPATIC FUNCTION PANEL
AST: 46 U/L — ABNORMAL HIGH (ref 0–37)
Albumin: 4.1 g/dL (ref 3.5–5.2)
Alkaline Phosphatase: 58 U/L (ref 39–117)
Total Protein: 7.6 g/dL (ref 6.0–8.3)

## 2011-02-17 LAB — LIPID PANEL
Cholesterol: 118 mg/dL (ref 0–200)
HDL: 26.4 mg/dL — ABNORMAL LOW (ref >39.00)
LDL Cholesterol: 60 mg/dL (ref 0–99)
Triglycerides: 156 mg/dL — ABNORMAL HIGH (ref 0.0–149.0)

## 2011-02-17 LAB — BASIC METABOLIC PANEL
CO2: 30 mEq/L (ref 19–32)
Calcium: 9.9 mg/dL (ref 8.4–10.5)
GFR: 103.21 mL/min (ref >60.00)
Sodium: 142 mEq/L (ref 135–145)

## 2011-02-17 LAB — URINALYSIS, ROUTINE W REFLEX MICROSCOPIC
Specific Gravity, Urine: 1.02 (ref 1.000–1.030)
Urine Glucose: NEGATIVE
Urobilinogen, UA: 1 (ref 0.0–1.0)

## 2011-02-17 LAB — PSA: PSA: 5.33 ng/mL — ABNORMAL HIGH (ref 0.10–4.00)

## 2011-02-20 ENCOUNTER — Encounter: Payer: Self-pay | Admitting: Gastroenterology

## 2011-02-20 ENCOUNTER — Ambulatory Visit (INDEPENDENT_AMBULATORY_CARE_PROVIDER_SITE_OTHER): Payer: Medicare Other | Admitting: Gastroenterology

## 2011-02-20 ENCOUNTER — Other Ambulatory Visit (INDEPENDENT_AMBULATORY_CARE_PROVIDER_SITE_OTHER): Payer: Medicare Other

## 2011-02-20 VITALS — BP 134/82 | HR 62 | Ht 71.0 in | Wt 186.0 lb

## 2011-02-20 DIAGNOSIS — K7689 Other specified diseases of liver: Secondary | ICD-10-CM

## 2011-02-20 DIAGNOSIS — R7401 Elevation of levels of liver transaminase levels: Secondary | ICD-10-CM

## 2011-02-20 DIAGNOSIS — Z86010 Personal history of colonic polyps: Secondary | ICD-10-CM

## 2011-02-20 LAB — COMPREHENSIVE METABOLIC PANEL
ALT: 52
AST: 41 — ABNORMAL HIGH
CO2: 28
Chloride: 101
GFR calc Af Amer: >60
GFR calc non Af Amer: >60
Potassium: 4
Sodium: 138
Total Bilirubin: 1

## 2011-02-20 LAB — HEPATIC FUNCTION PANEL
ALT: 53 U/L (ref 0–53)
AST: 54 U/L — ABNORMAL HIGH (ref 0–37)
Albumin: 4.6 g/dL (ref 3.5–5.2)
Alkaline Phosphatase: 66 U/L (ref 39–117)
Bilirubin, Direct: 0.2 mg/dL (ref 0.0–0.3)
Total Protein: 8.4 g/dL — ABNORMAL HIGH (ref 6.0–8.3)

## 2011-02-20 LAB — PROTIME-INR: Prothrombin Time: 11.9 s (ref 10.2–12.4)

## 2011-02-20 NOTE — Patient Instructions (Addendum)
Go directly to the basement to have your labs drawn today. You have been scheduled for a abdominal ultrasound on 02/26/11 at 8:00am . Nothing to eat or drink after midnight. Please arrive 15 minutes early for registration.  cc: Alroy Dust, MD

## 2011-02-20 NOTE — Progress Notes (Signed)
History of Present Illness: This is a 70 year old male that I have seen previously. He has had a history of abnormal liver function tests since the late 70s. He was evaluated by Mike Gip, PA-C and myself in 2002 and 2003 for abnormal liver function tests. He has a remote history of alcohol and drug abuse in the 60s but has not had alcohol since that time. He had a liver biopsy performed in 1979 which showed "persistent hepatitis". Serologic evaluation was negative, abd CT scan imaging was negative and an abdominal ultrasound showed fatty liver in 2002/2003. He has had persistent mildly elevated transaminases since that time. Denies weight loss, abdominal pain, constipation, diarrhea, change in stool caliber, melena, hematochezia, nausea, vomiting, dysphagia, reflux symptoms, chest pain.  Review of Systems: Pertinent positive and negative review of systems were noted in the above HPI section. All other review of systems were otherwise negative.  Current Medications, Allergies, Past Medical History, Past Surgical History, Family History and Social History were reviewed in [employer] record.  Physical Exam: General: Well developed , well nourished, no acute distress Head: Normocephalic and atraumatic Eyes:  sclerae anicteric, EOMI Ears: Normal auditory acuity Mouth: No deformity or lesions Neck: Supple, no masses or thyromegaly Lungs: Clear throughout to auscultation Heart: Regular rate and rhythm; no murmurs, rubs or bruits Abdomen: Soft, non tender and non distended. No masses, hepatosplenomegaly or hernias noted. Normal Bowel sounds Rectal: Deferred due to upcoming colonoscopy Musculoskeletal: Symmetrical with no gross deformities  Skin: No lesions on visible extremities Pulses:  Normal pulses noted Extremities: No clubbing, cyanosis, edema or deformities noted Neurological: Alert oriented x 4, grossly nonfocal Cervical Nodes:  No significant cervical  adenopathy Inguinal Nodes: No significant inguinal adenopathy Psychological:  Alert and cooperative. Normal mood and affect  Assessment and Recommendations:  1. Persistently elevated transaminases. They are only slightly above the normal range. Presumably due to fatty infiltration of the liver noted on prior ultrasound. Will repeat his serologic evaluation and schedule abdominal ultrasound imaging. Consider liver biopsy.  2. Personal history of adenomatous colon polyps. Surveillance colonoscopy recommended November 2012.

## 2011-02-21 ENCOUNTER — Encounter: Payer: Self-pay | Admitting: Gastroenterology

## 2011-02-24 NOTE — Progress Notes (Signed)
Advised patient's spouse that labs were normal (she states he tried to call back recently after we left a message but he was unable to get results).

## 2011-02-26 ENCOUNTER — Ambulatory Visit (HOSPITAL_COMMUNITY)
Admission: RE | Admit: 2011-02-26 | Discharge: 2011-02-26 | Disposition: A | Discharge status: Home / Self Care | Payer: Medicare Other | Source: Ambulatory Visit | Attending: Gastroenterology | Admitting: Gastroenterology

## 2011-02-26 DIAGNOSIS — R7989 Other specified abnormal findings of blood chemistry: Secondary | ICD-10-CM | POA: Insufficient documentation

## 2011-02-26 DIAGNOSIS — D7389 Other diseases of spleen: Secondary | ICD-10-CM | POA: Insufficient documentation

## 2011-02-26 DIAGNOSIS — R7401 Elevation of levels of liver transaminase levels: Secondary | ICD-10-CM

## 2011-02-26 DIAGNOSIS — I10 Essential (primary) hypertension: Secondary | ICD-10-CM | POA: Insufficient documentation

## 2011-02-26 DIAGNOSIS — Q619 Cystic kidney disease, unspecified: Secondary | ICD-10-CM | POA: Insufficient documentation

## 2011-03-10 LAB — COMPREHENSIVE METABOLIC PANEL
ALT: 56 — ABNORMAL HIGH
AST: 50 — ABNORMAL HIGH
Albumin: 3.6
Chloride: 105
Creatinine, Ser: 1.25
GFR calc Af Amer: >60
Sodium: 140
Total Bilirubin: 0.9

## 2011-03-10 LAB — TROPONIN I: Troponin I: 0.01

## 2011-03-10 LAB — DIFFERENTIAL
Basophils Absolute: 0
Eosinophils Relative: 3
Lymphocytes Relative: 14
Lymphs Abs: 0.7
Monocytes Absolute: 0.3

## 2011-03-10 LAB — CBC
Hemoglobin: 15.3
MCV: 89.6
Platelets: 152
Platelets: 161
RDW: 13.9
WBC: 5

## 2011-03-10 LAB — BASIC METABOLIC PANEL
BUN: 12
Calcium: 9.2
Creatinine, Ser: 0.89
GFR calc non Af Amer: >60
Glucose, Bld: 98
Sodium: 138

## 2011-03-10 LAB — LIPID PANEL
LDL Cholesterol: 82
VLDL: 17

## 2011-03-10 LAB — POCT CARDIAC MARKERS: Troponin i, poc: <0.05

## 2011-03-10 LAB — APTT: aPTT: 25

## 2011-03-10 LAB — CARDIAC PANEL(CRET KIN+CKTOT+MB+TROPI)
CK, MB: 23.7 — ABNORMAL HIGH
Total CK: 1803 — ABNORMAL HIGH
Troponin I: 0.02

## 2011-03-10 LAB — CK TOTAL AND CKMB (NOT AT ARMC): Relative Index: 1.4

## 2011-05-20 ENCOUNTER — Encounter: Payer: Self-pay | Admitting: Gastroenterology

## 2011-09-11 ENCOUNTER — Telehealth: Payer: Self-pay | Admitting: Pulmonary Disease

## 2011-09-11 MED ORDER — FIRST-DUKES MOUTHWASH MT SUSP: OROMUCOSAL | Status: DC

## 2011-09-11 NOTE — Telephone Encounter (Signed)
Called and spoke with pt and he stated that his tongue has been sore x 4 days and it only hurts when he hits it with his teeth. Pt denied any other sores in his mouth and pt has not changed or started any new meds recently.  SN please advise. Thanks  Allergies  Allergen Reactions  . Penicillins     REACTION: hives

## 2011-09-11 NOTE — Telephone Encounter (Signed)
Called and spoke with pt and he is aware of SN recs to try the MMW to help with the sore tongue.  Pt voiced his understanding and is aware of meds called to the pharmacy.

## 2011-09-11 NOTE — Telephone Encounter (Signed)
Per SN---ok for pt to have MMW  #4oz  1 tsp swish and swallow four times daily as needed with 5 refills.  thanks

## 2012-02-24 ENCOUNTER — Other Ambulatory Visit: Payer: Self-pay | Admitting: Pulmonary Disease

## 2012-03-01 ENCOUNTER — Encounter: Payer: Self-pay | Admitting: Gastroenterology

## 2012-03-05 ENCOUNTER — Encounter: Payer: Self-pay | Admitting: Gastroenterology

## 2012-03-23 ENCOUNTER — Ambulatory Visit (AMBULATORY_SURGERY_CENTER): Payer: Medicare Other | Admitting: *Deleted

## 2012-03-23 VITALS — Ht 71.0 in | Wt 185.0 lb

## 2012-03-23 DIAGNOSIS — Z1211 Encounter for screening for malignant neoplasm of colon: Secondary | ICD-10-CM

## 2012-03-23 MED ORDER — MOVIPREP 100 G PO SOLR: ORAL | Status: DC

## 2012-03-29 ENCOUNTER — Other Ambulatory Visit: Payer: Self-pay | Admitting: Pulmonary Disease

## 2012-04-06 ENCOUNTER — Ambulatory Visit (AMBULATORY_SURGERY_CENTER): Payer: Medicare Other | Admitting: Gastroenterology

## 2012-04-06 ENCOUNTER — Encounter: Payer: Self-pay | Admitting: Gastroenterology

## 2012-04-06 VITALS — BP 167/97 | HR 60 | Temp 98.0°F | Resp 18 | Ht 71.0 in | Wt 185.0 lb

## 2012-04-06 DIAGNOSIS — D126 Benign neoplasm of colon, unspecified: Secondary | ICD-10-CM

## 2012-04-06 DIAGNOSIS — Z1211 Encounter for screening for malignant neoplasm of colon: Secondary | ICD-10-CM

## 2012-04-06 DIAGNOSIS — Z86010 Personal history of colonic polyps: Secondary | ICD-10-CM

## 2012-04-06 MED ORDER — SODIUM CHLORIDE 0.9 % IV SOLN: 500.0000 mL | INTRAVENOUS | Status: DC

## 2012-04-06 NOTE — Patient Instructions (Addendum)
YOU HAD AN ENDOSCOPIC PROCEDURE TODAY AT THE [facility]: Refer to the procedure report that was given to you for any specific questions about what was found during the examination.  If the procedure report does not answer your questions, please call your gastroenterologist to clarify.  If you requested that your care partner not be given the details of your procedure findings, then the procedure report has been included in a sealed envelope for you to review at your convenience later.  YOU SHOULD EXPECT: Some feelings of bloating in the abdomen. Passage of more gas than usual.  Walking can help get rid of the air that was put into your GI tract during the procedure and reduce the bloating. If you had a lower endoscopy (such as a colonoscopy or flexible sigmoidoscopy) you may notice spotting of blood in your stool or on the toilet paper. If you underwent a bowel prep for your procedure, then you may not have a normal bowel movement for a few days.  DIET: Your first meal following the procedure should be a light meal and then it is ok to progress to your normal diet.  A half-sandwich or bowl of soup is an example of a good first meal.  Heavy or fried foods are harder to digest and may make you feel nauseous or bloated.  Likewise meals heavy in dairy and vegetables can cause extra gas to form and this can also increase the bloating.  Drink plenty of fluids but you should avoid alcoholic beverages for 24 hours.  ACTIVITY: Your care partner should take you home directly after the procedure.  You should plan to take it easy, moving slowly for the rest of the day.  You can resume normal activity the day after the procedure however you should NOT DRIVE or use heavy machinery for 24 hours (because of the sedation medicines used during the test).    SYMPTOMS TO REPORT IMMEDIATELY: A gastroenterologist can be reached at any hour.  During normal business hours, 8:30 AM to 5:00 PM Monday through Friday,  call [phone].  After hours and on weekends, please call the GI answering service at [phone] who will take a message and have the physician on call contact you.   Following lower endoscopy (colonoscopy or flexible sigmoidoscopy):  Excessive amounts of blood in the stool  Significant tenderness or worsening of abdominal pains  Swelling of the abdomen that is new, acute  Fever of 100F or higher  Following upper endoscopy (EGD)  Vomiting of blood or coffee ground material  New chest pain or pain under the shoulder blades  Painful or persistently difficult swallowing  New shortness of breath  Fever of 100F or higher  Black, tarry-looking stools  FOLLOW UP: If any biopsies were taken you will be contacted by phone or by letter within the next 1-3 weeks.  Call your gastroenterologist if you have not heard about the biopsies in 3 weeks.  Our staff will call the home number listed on your records the next business day following your procedure to check on you and address any questions or concerns that you may have at that time regarding the information given to you following your procedure. This is a courtesy call and so if there is no answer at the home number and we have not heard from you through the emergency physician on call, we will assume that you have returned to your regular daily activities without incident.  SIGNATURES/CONFIDENTIALITY: You and/or your care   partner have signed paperwork which will be entered into your electronic medical record.  These signatures attest to the fact that that the information above on your After Visit Summary has been reviewed and is understood.  Full responsibility of the confidentiality of this discharge information lies with you and/or your care-partner.  

## 2012-04-06 NOTE — Progress Notes (Signed)
Patient did not experience any of the following events: a burn prior to discharge; a fall within the facility; wrong site/side/patient/procedure/implant event; or a hospital transfer or hospital admission upon discharge from the facility. (G8907) Patient did not have preoperative order for IV antibiotic SSI prophylaxis. ([id number])  

## 2012-04-06 NOTE — Progress Notes (Signed)
NAC-VSS-tolerated procedure well. Anesthesia uneventful

## 2012-04-06 NOTE — Op Note (Signed)
[facility] [address] [place], [address]   COLONOSCOPY PROCEDURE REPORT  PATIENT: Dustin Hamilton, Dustin Hamilton  MR#: [id number] BIRTHDATE: 1941/02/25 , 71  yrs. old GENDER: Male ENDOSCOPIST: Meryl Dare, MD, [facility] PROCEDURE DATE:  04/06/2012 PROCEDURE:   Colonoscopy with snare polypectomy ASA CLASS:   Class II INDICATIONS: patient's personal history of tubulovillous adenomatous colon polyps, 2007 MEDICATIONS: MAC sedation, administered by CRNA and propofol (Diprivan) 150mg  IV DESCRIPTION OF PROCEDURE:   After the risks benefits and alternatives of the procedure were thoroughly explained, informed consent was obtained.  A digital rectal exam revealed no abnormalities of the rectum.   The LB [id number]  endoscope was introduced through the anus and advanced to the cecum, which was identified by both the appendix and ileocecal valve. No adverse events experienced.   The quality of the prep was excellent, using MoviPrep  The instrument was then slowly withdrawn as the colon was fully examined.  COLON FINDINGS: A sessile polyp measuring 6 mm in size was found in the ascending colon.  A polypectomy was performed with a cold snare.  The resection was complete and the polyp tissue was completely retrieved.   The colon was otherwise normal.  There was no diverticulosis, inflammation, polyps or cancers unless previously stated.  Retroflexed views revealed small internal hemorrhoids. The time to cecum=2 minutes 42 seconds.  Withdrawal time=9 minutes 30 seconds.  The scope was withdrawn and the procedure completed. COMPLICATIONS: There were no complications.  ENDOSCOPIC IMPRESSION: 1.   Sessile polyp in the ascending colon; polypectomy was performed with a cold snare 2.   Small internal hemorrhoids  RECOMMENDATIONS: 1.  Await pathology results 2.  Repeat Colonoscopy in 5 years.   eSigned:  Meryl Dare, MD, [facility] 04/06/2012 3:05 PM

## 2012-04-07 ENCOUNTER — Telehealth: Payer: Self-pay | Admitting: *Deleted

## 2012-04-07 ENCOUNTER — Other Ambulatory Visit: Payer: Self-pay | Admitting: Pulmonary Disease

## 2012-04-07 NOTE — Telephone Encounter (Signed)
  Follow up Call-  Call back number 04/06/2012  Post procedure Call Back phone  # [phone]  Permission to leave phone message Yes     Patient questions:  Do you have a fever, pain , or abdominal swelling? no Pain Score  0 *  Have you tolerated food without any problems? yes  Have you been able to return to your normal activities? yes  Do you have any questions about your discharge instructions: Diet   no Medications  no Follow up visit  no  Do you have questions or concerns about your Care? no  Actions: * If pain score is 4 or above: No action needed, pain <4.  No answer message left for the patient. Unable to cancel above questions from epic. Filled out questions only to close encounter.

## 2012-04-07 NOTE — Telephone Encounter (Signed)
  Follow up Call-  Call back number 04/06/2012  Post procedure Call Back phone  # [phone]  Permission to leave phone message Yes     Patient questions:  Do you have a fever, pain , or abdominal swelling? no Pain Score  0 *  Have you tolerated food without any problems? yes  Have you been able to return to your normal activities? yes  Do you have any questions about your discharge instructions: Diet   no Medications  no Follow up visit  no  Do you have questions or concerns about your Care? no  Actions: * If pain score is 4 or above: No action needed, pain <4.

## 2012-04-08 ENCOUNTER — Telehealth: Payer: Self-pay | Admitting: Pulmonary Disease

## 2012-04-08 MED ORDER — AMLODIPINE BESYLATE 10 MG PO TABS: 10.0000 mg | ORAL_TABLET | Freq: Every day | ORAL | Status: DC

## 2012-04-08 NOTE — Telephone Encounter (Signed)
Called and spoke with pt and he is aware that the medication was denied due to pt needing a follow up with SN.  appt has been scheduled for 12-2 at 12 and pt is aware of appt.  Pt is aware that rx has been sent in to the pharmacy for the norvasc.  Nothing further is needed.

## 2012-04-12 ENCOUNTER — Encounter: Payer: Self-pay | Admitting: Gastroenterology

## 2012-04-20 ENCOUNTER — Other Ambulatory Visit: Payer: Self-pay | Admitting: Pulmonary Disease

## 2012-04-28 ENCOUNTER — Encounter: Payer: Self-pay | Admitting: *Deleted

## 2012-05-01 ENCOUNTER — Other Ambulatory Visit: Payer: Self-pay | Admitting: Pulmonary Disease

## 2012-05-03 ENCOUNTER — Ambulatory Visit (INDEPENDENT_AMBULATORY_CARE_PROVIDER_SITE_OTHER): Payer: Medicare Other | Admitting: Pulmonary Disease

## 2012-05-03 ENCOUNTER — Ambulatory Visit (INDEPENDENT_AMBULATORY_CARE_PROVIDER_SITE_OTHER)
Admission: RE | Admit: 2012-05-03 | Discharge: 2012-05-03 | Disposition: A | Discharge status: Home / Self Care | Payer: Medicare Other | Source: Ambulatory Visit | Attending: Pulmonary Disease | Admitting: Pulmonary Disease

## 2012-05-03 ENCOUNTER — Encounter: Payer: Self-pay | Admitting: Pulmonary Disease

## 2012-05-03 VITALS — BP 120/82 | HR 67 | Temp 96.8°F | Ht 71.0 in | Wt 191.8 lb

## 2012-05-03 DIAGNOSIS — E786 Lipoprotein deficiency: Secondary | ICD-10-CM

## 2012-05-03 DIAGNOSIS — R945 Abnormal results of liver function studies: Secondary | ICD-10-CM

## 2012-05-03 DIAGNOSIS — I679 Cerebrovascular disease, unspecified: Secondary | ICD-10-CM

## 2012-05-03 DIAGNOSIS — F419 Anxiety disorder, unspecified: Secondary | ICD-10-CM

## 2012-05-03 DIAGNOSIS — M549 Dorsalgia, unspecified: Secondary | ICD-10-CM

## 2012-05-03 DIAGNOSIS — R972 Elevated prostate specific antigen [PSA]: Secondary | ICD-10-CM

## 2012-05-03 DIAGNOSIS — I119 Hypertensive heart disease without heart failure: Secondary | ICD-10-CM

## 2012-05-03 DIAGNOSIS — I10 Essential (primary) hypertension: Secondary | ICD-10-CM

## 2012-05-03 DIAGNOSIS — Z87898 Personal history of other specified conditions: Secondary | ICD-10-CM

## 2012-05-03 DIAGNOSIS — D126 Benign neoplasm of colon, unspecified: Secondary | ICD-10-CM

## 2012-05-03 MED ORDER — HYDRALAZINE HCL 25 MG PO TABS: 25.0000 mg | ORAL_TABLET | Freq: Two times a day (BID) | ORAL | Status: DC

## 2012-05-03 MED ORDER — CHLORZOXAZONE 500 MG PO TABS: ORAL_TABLET | ORAL | Status: DC

## 2012-05-03 MED ORDER — AMLODIPINE BESYLATE 10 MG PO TABS: 10.0000 mg | ORAL_TABLET | Freq: Every day | ORAL | Status: DC

## 2012-05-03 MED ORDER — FUROSEMIDE 40 MG PO TABS: 40.0000 mg | ORAL_TABLET | Freq: Every day | ORAL | Status: DC

## 2012-05-03 MED ORDER — LISINOPRIL 20 MG PO TABS: 20.0000 mg | ORAL_TABLET | Freq: Every day | ORAL | Status: DC

## 2012-05-03 MED ORDER — LABETALOL HCL 200 MG PO TABS: 200.0000 mg | ORAL_TABLET | Freq: Two times a day (BID) | ORAL | Status: DC

## 2012-05-03 MED ORDER — TADALAFIL 20 MG PO TABS: 20.0000 mg | ORAL_TABLET | Freq: Every day | ORAL | Status: DC | PRN

## 2012-05-03 NOTE — Patient Instructions (Addendum)
Today we updated your med list in our EPIC system...    Continue your current medications the same...    We refilled your meds per request...  Today we did your follow up CXR & EKG...  Please return to our lab one morning this week for your FASTING blood work...    We will call you w/ the results when avail...  Keep a close monitor on your BP...  Call for any questions...  Let's plan a follow up exam in 1 yrs time, sooner if needed for problems.Marland KitchenMarland Kitchen

## 2012-05-03 NOTE — Progress Notes (Signed)
Subjective:    Patient ID: Dustin Hamilton, male    DOB: 11-07-1940, 71 y.o.   MRN: [id number]  HPI 71 y/o BM here for a follow up visit... he has multiple medical problems as noted below...    ~  August 10, 2009:  he had a good year overall w/ BP controlled on 5 drug regimen & he is reminded to take them regularly... he had left sided back discomfort & saw TP 1/11- improved w/ Advil/ Skelaxin... he had f/u colonoscopy 11/09 w/ several adenomatous polyps removed & DrStark plans repeat in 15yrs... he continues to see DrNesi every 34mo & he does his PSA's (pt reports they are all "OK"... he adamantly refuses all vaccinations.  ~  September 05, 2009:  add-on appt for 2 week hx right sided back pain & right ear pain... notes intermittent (daily 5-6 x per day), severe (9/10), mid right back pain below scap tip... pain ppt by certain movements, occas relieved by other movements, lasts 1-2 min & resolves... "it ain't good" he says- hasn't taken any meds for this despite hx left sided back pain 1/11 (seen by TP & in ER- neg CXR, neg TSpine films, norm lab work)... **we discussed Rx w/ [id number] & ParafonTid regularly to decr freq & severity of the pain. Also c/o right ear pain x 2weeks- exam shows mild OM & Rx w/ antibiotics, & Auralgan gtts.  ~  January 29, 2011:  42mo ROV & he reports stable overall, notes some LBP & saw TP 6/12 w/ rec for rest, heat, anti-inflamm rx (Motrin vs Mobic) & he says improved... He has severe HBP on 5 med regimen that he has been taking faithfully he says (prev hx non-compliance w/ sm right occip hemorrhage 2006); BP= 136/84 today & he denies CP, palpit, syncope, SOB, edema, or cerebral ischemic symptoms, etc; he notes occas dizzy- intermittent, self limited, discussed rx w/ Bonine OTC;  He has hx low-HDL chol on diet management alone; note prev LFTs elev w/ hx chr persist hepatitis- he knows to avoid all hepatotoxins; note f/u colon due 11/12 per DrStark; he has elev PSA & followed by  Dustin Hamilton w/ appt due next week...  He will ret for fasting blood work >>  ~  May 03, 2012:  48mo ROV & Dustin Hamilton has had a good year- no new complaints or concerns...  We addressed these problems during today's visit:     HBP, HCVD, abnEKG> on Labet200Bid, Amlod10, Lisin20, Lasix40, Apres25Bid; BP= 120/82 & pt is taking his meds regularly w/ good control of BP; denies CP, palpit, HA, dizzy, SOB, edema... EKG w/ ant scar, sl IVCD, NAD (Baseline is just NSSTTWA)... f/u 2DEcho to assess ant wall motion=> mild LVH, norm LVF w/ EF=60-65%, no regional wall motion abnormalities...    Cerebrovasc dis> on ASA325; he denies visual sx, HAs, dizzy, syncope, weakness, etc...    Low HDL> on diet alone; FLP shows TChol 127, TG 110, HDL 29, LDL 76; advised to incr exercise program...    GI- polyps, hems, RIH> hx mult adenomatous polyps; last colon 11/13 showed one polyp & int hems- Bx= tubular adenoma & f/u requested in 5 yrs...    Abn LFT's- prob FLD> eval by DrWeissman~1980 & DrStark 2004 (see below); last check 9/12- reviewed & repeat serology was neg x HepB Ab (prior exposure & immunity); Sonar was essent wnl as well; Labs reveal SGOT=70, SGPT=80.    GU- BPH, elevPSA, Hematuria, Renal cyst> followed by [facility] & PSA=  5.80    LBP> on Motrin as needed; known spondylitic changes on XRays...  We reviewed prob list, meds, xrays and labs> see below for updates >> he has a hx of non-compliance w/ ed Rx & advised to take all meds as directed every day... CXR 12/13 showed norm heart size, tortuous Ao, elev right hemidiaph, no focal airsp dis, basilar atx, NAD.Marland KitchenMarland Kitchen EKG 12/13 showed NSR, rate61, poor R prog V1-3... 2DEcho 12/13 showed mild LVH, normal LVF w/ EF=60-65%, norm wall motion & no regional wall motion abn; Gr1DD... LABS 12/13:  FLP- at goals on diet alone;  Chems- ok x incr LFTs w/ GOT=70 GPT=80;  CBC- wnl;  TSH=1.07;  PSA=5.80 (copy to [facility])...           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 ~  CXR 1/11 showed elev right hemidiaph w/ scarring, NAD.Marland Kitchen. ~  CXR 12/13 showed norm heart size, tortuous Ao, elev right hemidiaph, no focal airsp dis, basilar atx, NAD...  2. HYPERTENSION, SEVERE (ICD-401.0) - long hx severe HBP and noncompliance w/ med Rx;  resulted in hospitalization w/ small ICH 1/06;  supposed to be on a 5 drug regimen:  LABETOLOL 200mg - 2tabsBid,  HYDRALAZINE 25mg Bid,  AMLODIPINE 10mg /d,  COZAAR 100mg /d,  LASIX 40mg /d...   ~  8/12:  BP= 136/84, & similar at home- taking meds regularly he says, & tolerating well... denies HA, fatigue, visual changes, CP, palipit, syncope, dyspnea, edema, etc...  ~  12/13:  on Labet200Bid, Amlod10, Lisin20, Lasix40, Apres25Bid; BP= 120/82 & pt is taking his meds regularly w/ good control of BP; denies CP, palpit, HA, dizzy, SOB, edema... EKG w/ ant scar, sl IVCD, NAD (Baseline is just NSSTTWA)... f/u 2DEcho to assess ant wall motion=> mild LVH, norm LVF w/ EF=60-65%, no regional wall motion abnormalities...  3. 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 ~  2D Echo 1/06 showed mod incr LV wall thickness... ~  repeat 2DEcho 12/08 showed norm LV size & function w/ EF=65% but mild incr LV wall thickness, no regional wall motion abn... ~  EKG 12/13 showed NSR, rate61, 1st degree ABV, poor R prog/ ant scar, mild IVCD, no acute STTWA... ~  2DEcho 12/13 ==>  mild LVH, norm LVF w/ EF=60-65%, no regional wall motion abnormalities...  4. PERS HX NONCOMPLIANCE W/MED TX PRS HAZARDS HLTH (ICD-V15.81) - BP's have been adeq controlled when he takes his meds regularly... we discuss medication compliance at each & every OV.  5. CEREBROVASCULAR DISEASE (ICD-437.9) - MRIBrain 1/06 showed small Rt occipital hem, mild atrophy, & chr sm vessel ischemic dz...  6. Hx of INTRACRANIAL HEMORRHAGE (ICD-432.9)  7. 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 he is counselled on a low chol/ low fat diet & exercise program to help his HDL... ~  FLP 11/08 showed TChol 133, TG 103, HDL 25, LDL 87 ~  FLP 11/09 showed TChol 113, TG 100, HDL 24, LDL 69... rec> incr exercise. ~  FLP 9/12 ==> pending  8. LIVER FUNCTION TESTS, ABNORMAL (ICD-794.8) - Hx prev GI evals:  Remote hx Etoh & drug abuse;  Hx HepA 1960's; GI eval 1979 DrWeissman w/ + HepBSAg & liver bx showing Chr Persist Hepatitis;  GI eval DrStark 2004 w/ prob fatty infiltration of liver;  LFT's ~ 70/70 11/08; ~  labs 10/09 showed SGOT= 52, SGPT= 54... rec> no hepatotoxins, diet, keep wt  down. ~  labs 1/11 showed SGOT= 44, SGPT= 49 ~  Labs 9/12 ==> pending  9. COLONIC POLYPS (ICD-211.3) ~  colonoscopy 7/07 by DrStark w/ mult polyps 4-24mm size (adenomatous)& hems;  f/u 3 yrs. ~  colonoscopy 11/09 by DrStark showed mult polyps 4-51mm size (adenomatous on bx), f/u 45yrs. ~  Colonoscopy 11/13 showed one polyp & int hems- Bx= tubular adenoma & f/u requested in 5 yrs...  10. HEMORRHOIDS (ICD-455.6)  11. INGUINAL HERNIA, RIGHT (ICD-550.90) >> repaired by drGross, [facility] in Jan09...  12. PSA, INCREASED (ICD-790.93) - eval by DrNesi for Urology & pt reports that he is checked every 9mo... ~  labs 6/07 showed PSA= 6.13,   ~  labs 11/08 showed PSA= 5.11 ~  we don't have follow up notes from DrNesi  13. BENIGN PROSTATIC HYPERTROPHY, HX OF (ICD-V13.8)  14. Hx of HEMATURIA UNSPECIFIED (ICD-599.70) - s/p neg cystoscopy 10/01...  15. RENAL CYST (ICD-593.2)  16. 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 BACK PAIN (ICD-724.5) - hx fluctuating left/ right sides back pains... TSpine films 1/11 w/ spondylitic changes, athropathy, etc... He takes MOTRIN 800mg  Prn...   Past Surgical History  Procedure Date  . Cystoscopy and retrograde pyleogram   . Hernia repair 1965 & 1980's &2008    bilateral  . Liver biopsy 1980's    Outpatient Encounter Prescriptions as of 05/03/2012    Medication Sig Dispense Refill  . amLODipine (NORVASC) 10 MG tablet Take 1 tablet (10 mg total) by mouth daily.  90 tablet  0  . aspirin 325 MG tablet Take 325 mg by mouth daily.        . chlorzoxazone (PARAFON) 500 MG tablet TAKE ONE TABLET BY MOUTH THREE TIMES DAILY FOR  MUSCLE  SPASM  90 tablet  0  . furosemide (LASIX) 40 MG tablet Take 1 tablet (40 mg total) by mouth daily.  90 tablet  3  . hydrALAZINE (APRESOLINE) 25 MG tablet TAKE ONE TABLET BY MOUTH TWICE DAILY  180 tablet  0  . labetalol (NORMODYNE) 200 MG tablet TAKE ONE TABLET BY MOUTH TWICE DAILY  180 tablet  2  . lisinopril (PRINIVIL,ZESTRIL) 20 MG tablet Take 1 tablet (20 mg total) by mouth daily.  90 tablet  3  . tadalafil (CIALIS) 20 MG tablet Take 1 tablet (20 mg total) by mouth daily as needed.  10 tablet  11    Allergies  Allergen Reactions  . Penicillins     REACTION: hives    Current Medications, Allergies, Past Medical History, Past Surgical History, Family History, and Social History were reviewed in [employer] record.     Review of Systems         See HPI - all other systems neg except as noted...  The patient complains of decreased hearing, dyspnea on exertion, and difficulty walking.  The patient denies anorexia, fever, weight loss, weight gain, vision loss, hoarseness, chest pain, syncope, peripheral edema, prolonged cough, headaches, hemoptysis, abdominal pain, melena, hematochezia, severe indigestion/heartburn, hematuria, incontinence, muscle weakness, suspicious skin lesions, transient blindness, depression, unusual weight change, abnormal bleeding, enlarged lymph nodes, and angioedema.   Objective:   Physical Exam      WD, WN, 71 y/o BM in NAD... GENERAL:  Alert & oriented; pleasant & cooperative. HEENT:  [place]/AT, EOM-wnl, PERRLA, EACs-clear, TMs- red sl bulge on right, NOSE-clear, THROAT-clear & wnl. NECK:  Supple w/ fairROM; no JVD; normal carotid impulses w/o bruits; no  thyromegaly or nodules palpated;  no lymphadenopathy. CHEST:  Clear to P & A; without wheezes/ rales/ or rhonchi. HEART:  Regular Rhythm; without murmurs/ rubs/ or gallops. ABDOMEN:  Soft & nontender; normal bowel sounds; no organomegaly or masses detected. EXT: without deformities or arthritic changes; no varicose veins/ venous insuffic/ or edema... BACK:  no lesions seen, non-tender to palp or motion of arm/ shoulder... NEURO:  CN's intact; motor testing normal; sensory testing normal; gait normal & balance OK. DERM:  round patch on right lat thigh area- ?ring worm ?dermatitis- try rx w/ Lotrisone cream.  RADIOLOGY DATA:  Reviewed in the EPIC EMR & discussed w/ the patient...  LABORATORY DATA:  Reviewed in the EPIC EMR & discussed w/ the patient...   Assessment & Plan:    HBP & HCVD>  Hx severe HBP on 5 drug regimen & he is reminded to take all meds every day & check BP at home to be sure control is adeq; continue same.  Hx ICH in 2006 related to medication non-compliance; known sm vessel dis & mild atrophy on scan...  LOW-HDL Chol>  Oddly his TChol looks great & LDL is ok on diet alone; he is encouraged to incr exercise, etc...  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  Colon Polyps>  Due for f/u colon w/ DrStark 11/12 & we will refer...  Elev PSA>  Followed by Thermon Leyland every 68mo & he has appt next week (we don't have recent notes).  Hx Back Pain>  We refilled his Motrin 800mg  per request...   Patient's Medications  New Prescriptions   No medications on file  Previous Medications   ASPIRIN 325 MG TABLET    Take 325 mg by mouth daily.    Modified Medications   Modified Medication Previous Medication   AMLODIPINE (NORVASC) 10 MG TABLET amLODipine (NORVASC) 10 MG tablet      Take 1 tablet (10 mg total) by mouth daily.    Take 1 tablet (10 mg  total) by mouth daily.   CHLORZOXAZONE (PARAFON) 500 MG TABLET chlorzoxazone (PARAFON) 500 MG tablet      TAKE ONE TABLET BY MOUTH THREE TIMES DAILY AS NEEDED FOR MUSCLE SPASMS    TAKE ONE TABLET BY MOUTH THREE TIMES DAILY FOR  MUSCLE  SPASM   FUROSEMIDE (LASIX) 40 MG TABLET furosemide (LASIX) 40 MG tablet      Take 1 tablet (40 mg total) by mouth daily.    Take 1 tablet (40 mg total) by mouth daily.   HYDRALAZINE (APRESOLINE) 25 MG TABLET hydrALAZINE (APRESOLINE) 25 MG tablet      Take 1 tablet (25 mg total) by mouth 2 (two) times daily.    TAKE ONE TABLET BY MOUTH TWICE DAILY   IBUPROFEN (ADVIL,MOTRIN) 800 MG TABLET ibuprofen (ADVIL,MOTRIN) 800 MG tablet      TAKE ONE TABLET BY MOUTH TWICE DAILY AS NEEDED FOR  ARTHRITIS  PAIN    Take 1 tablet (800 mg total) by mouth 2 (two) times daily as needed for pain (for arthritis pain).   LABETALOL (NORMODYNE) 200 MG TABLET labetalol (NORMODYNE) 200 MG tablet      Take 1 tablet (200 mg total) by mouth 2 (two) times daily.    TAKE ONE TABLET BY MOUTH TWICE DAILY   LISINOPRIL (PRINIVIL,ZESTRIL) 20 MG TABLET lisinopril (PRINIVIL,ZESTRIL) 20 MG tablet  Take 1 tablet (20 mg total) by mouth daily.    Take 1 tablet (20 mg total) by mouth daily.   TADALAFIL (CIALIS) 20 MG TABLET tadalafil (CIALIS) 20 MG tablet      Take 1 tablet (20 mg total) by mouth daily as needed.    Take 1 tablet (20 mg total) by mouth daily as needed.  Discontinued Medications   No medications on file

## 2012-05-05 ENCOUNTER — Other Ambulatory Visit (INDEPENDENT_AMBULATORY_CARE_PROVIDER_SITE_OTHER): Payer: Medicare Other

## 2012-05-05 DIAGNOSIS — D126 Benign neoplasm of colon, unspecified: Secondary | ICD-10-CM

## 2012-05-05 DIAGNOSIS — E786 Lipoprotein deficiency: Secondary | ICD-10-CM

## 2012-05-05 DIAGNOSIS — F419 Anxiety disorder, unspecified: Secondary | ICD-10-CM

## 2012-05-05 DIAGNOSIS — I10 Essential (primary) hypertension: Secondary | ICD-10-CM

## 2012-05-05 DIAGNOSIS — F411 Generalized anxiety disorder: Secondary | ICD-10-CM

## 2012-05-05 DIAGNOSIS — R945 Abnormal results of liver function studies: Secondary | ICD-10-CM

## 2012-05-05 DIAGNOSIS — R972 Elevated prostate specific antigen [PSA]: Secondary | ICD-10-CM

## 2012-05-05 LAB — LIPID PANEL: VLDL: 22 mg/dL (ref 0.0–40.0)

## 2012-05-05 LAB — BASIC METABOLIC PANEL
GFR: 102.85 mL/min (ref >60.00)
Glucose, Bld: 102 mg/dL — ABNORMAL HIGH (ref 70–99)
Potassium: 4.7 mEq/L (ref 3.5–5.1)
Sodium: 142 mEq/L (ref 135–145)

## 2012-05-05 LAB — CBC WITH DIFFERENTIAL/PLATELET
Basophils Absolute: 0 10*3/uL (ref 0.0–0.1)
Eosinophils Absolute: 0.2 10*3/uL (ref 0.0–0.7)
HCT: 49.3 % (ref 39.0–52.0)
Lymphs Abs: 1.1 10*3/uL (ref 0.7–4.0)
MCHC: 33.5 g/dL (ref 30.0–36.0)
MCV: 94.4 fl (ref 78.0–100.0)
Monocytes Absolute: 0.4 10*3/uL (ref 0.1–1.0)
Monocytes Relative: 6.9 % (ref 3.0–12.0)
Platelets: 153 10*3/uL (ref 150.0–400.0)
RDW: 14 % (ref 11.5–14.6)

## 2012-05-05 LAB — HEPATIC FUNCTION PANEL
AST: 70 U/L — ABNORMAL HIGH (ref 0–37)
Albumin: 4.3 g/dL (ref 3.5–5.2)
Alkaline Phosphatase: 50 U/L (ref 39–117)
Bilirubin, Direct: 0.1 mg/dL (ref 0.0–0.3)

## 2012-05-05 LAB — TSH: TSH: 1.07 u[IU]/mL (ref 0.35–5.50)

## 2012-05-11 ENCOUNTER — Other Ambulatory Visit: Payer: Self-pay | Admitting: Pulmonary Disease

## 2012-05-11 DIAGNOSIS — I119 Hypertensive heart disease without heart failure: Secondary | ICD-10-CM

## 2012-05-11 DIAGNOSIS — I10 Essential (primary) hypertension: Secondary | ICD-10-CM

## 2012-05-11 DIAGNOSIS — R945 Abnormal results of liver function studies: Secondary | ICD-10-CM

## 2012-05-11 DIAGNOSIS — K759 Inflammatory liver disease, unspecified: Secondary | ICD-10-CM

## 2012-05-13 ENCOUNTER — Other Ambulatory Visit (HOSPITAL_COMMUNITY): Payer: Medicare Other

## 2012-05-18 ENCOUNTER — Ambulatory Visit (HOSPITAL_COMMUNITY): Payer: Medicare Other | Attending: Cardiovascular Disease

## 2012-05-18 DIAGNOSIS — I679 Cerebrovascular disease, unspecified: Secondary | ICD-10-CM | POA: Insufficient documentation

## 2012-05-18 DIAGNOSIS — I10 Essential (primary) hypertension: Secondary | ICD-10-CM | POA: Insufficient documentation

## 2012-05-18 DIAGNOSIS — I119 Hypertensive heart disease without heart failure: Secondary | ICD-10-CM

## 2012-05-18 NOTE — Progress Notes (Signed)
Echocardiogram performed.  

## 2012-05-27 ENCOUNTER — Telehealth: Payer: Self-pay | Admitting: Pulmonary Disease

## 2012-05-27 NOTE — Telephone Encounter (Signed)
Spoke with pt He states that he is calling Leigh back  I advised per records, she already gave him the lab results He does not remember this, so I have reviewed this with him again Pt states nothing further needed

## 2012-05-27 NOTE — Telephone Encounter (Signed)
Error.Dustin Hamilton ° °

## 2012-06-23 ENCOUNTER — Ambulatory Visit: Payer: Medicare Other | Admitting: Gastroenterology

## 2012-08-02 ENCOUNTER — Encounter: Payer: Self-pay | Admitting: Gastroenterology

## 2012-08-02 ENCOUNTER — Encounter: Payer: Medicare Other | Admitting: Gastroenterology

## 2012-08-02 NOTE — Progress Notes (Signed)
This encounter was created in error - please disregard.

## 2012-08-16 ENCOUNTER — Other Ambulatory Visit: Payer: Self-pay | Admitting: Physician Assistant

## 2012-08-16 DIAGNOSIS — B1910 Unspecified viral hepatitis B without hepatic coma: Secondary | ICD-10-CM

## 2012-08-19 ENCOUNTER — Other Ambulatory Visit: Payer: Self-pay | Admitting: Pulmonary Disease

## 2012-08-20 ENCOUNTER — Ambulatory Visit
Admission: RE | Admit: 2012-08-20 | Discharge: 2012-08-20 | Disposition: A | Discharge status: Home / Self Care | Payer: Medicare Other | Source: Ambulatory Visit | Attending: Physician Assistant | Admitting: Physician Assistant

## 2012-08-20 DIAGNOSIS — B1910 Unspecified viral hepatitis B without hepatic coma: Secondary | ICD-10-CM

## 2012-09-12 ENCOUNTER — Inpatient Hospital Stay (HOSPITAL_COMMUNITY)
Admission: EM | Admit: 2012-09-12 | Discharge: 2012-09-13 | DRG: 065 | Disposition: A | Discharge status: Home / Self Care | Payer: Medicare Other | Attending: Internal Medicine | Admitting: Internal Medicine

## 2012-09-12 ENCOUNTER — Encounter (HOSPITAL_COMMUNITY): Payer: Self-pay | Admitting: *Deleted

## 2012-09-12 ENCOUNTER — Emergency Department (HOSPITAL_COMMUNITY): Payer: Medicare Other

## 2012-09-12 DIAGNOSIS — N281 Cyst of kidney, acquired: Secondary | ICD-10-CM

## 2012-09-12 DIAGNOSIS — Z823 Family history of stroke: Secondary | ICD-10-CM

## 2012-09-12 DIAGNOSIS — M549 Dorsalgia, unspecified: Secondary | ICD-10-CM

## 2012-09-12 DIAGNOSIS — I629 Nontraumatic intracranial hemorrhage, unspecified: Secondary | ICD-10-CM

## 2012-09-12 DIAGNOSIS — K4090 Unilateral inguinal hernia, without obstruction or gangrene, not specified as recurrent: Secondary | ICD-10-CM

## 2012-09-12 DIAGNOSIS — Z8261 Family history of arthritis: Secondary | ICD-10-CM

## 2012-09-12 DIAGNOSIS — I679 Cerebrovascular disease, unspecified: Secondary | ICD-10-CM

## 2012-09-12 DIAGNOSIS — Z9119 Patient's noncompliance with other medical treatment and regimen: Secondary | ICD-10-CM

## 2012-09-12 DIAGNOSIS — R945 Abnormal results of liver function studies: Secondary | ICD-10-CM

## 2012-09-12 DIAGNOSIS — I119 Hypertensive heart disease without heart failure: Secondary | ICD-10-CM

## 2012-09-12 DIAGNOSIS — D126 Benign neoplasm of colon, unspecified: Secondary | ICD-10-CM

## 2012-09-12 DIAGNOSIS — H9209 Otalgia, unspecified ear: Secondary | ICD-10-CM

## 2012-09-12 DIAGNOSIS — Z91199 Patient's noncompliance with other medical treatment and regimen due to unspecified reason: Secondary | ICD-10-CM

## 2012-09-12 DIAGNOSIS — I639 Cerebral infarction, unspecified: Secondary | ICD-10-CM

## 2012-09-12 DIAGNOSIS — R42 Dizziness and giddiness: Secondary | ICD-10-CM

## 2012-09-12 DIAGNOSIS — K649 Unspecified hemorrhoids: Secondary | ICD-10-CM

## 2012-09-12 DIAGNOSIS — F1011 Alcohol abuse, in remission: Secondary | ICD-10-CM

## 2012-09-12 DIAGNOSIS — Z87898 Personal history of other specified conditions: Secondary | ICD-10-CM

## 2012-09-12 DIAGNOSIS — Z8673 Personal history of transient ischemic attack (TIA), and cerebral infarction without residual deficits: Secondary | ICD-10-CM | POA: Diagnosis present

## 2012-09-12 DIAGNOSIS — R319 Hematuria, unspecified: Secondary | ICD-10-CM

## 2012-09-12 DIAGNOSIS — Z79899 Other long term (current) drug therapy: Secondary | ICD-10-CM

## 2012-09-12 DIAGNOSIS — R4182 Altered mental status, unspecified: Secondary | ICD-10-CM

## 2012-09-12 DIAGNOSIS — Z87891 Personal history of nicotine dependence: Secondary | ICD-10-CM

## 2012-09-12 DIAGNOSIS — Z7982 Long term (current) use of aspirin: Secondary | ICD-10-CM

## 2012-09-12 DIAGNOSIS — E786 Lipoprotein deficiency: Secondary | ICD-10-CM

## 2012-09-12 DIAGNOSIS — I10 Essential (primary) hypertension: Secondary | ICD-10-CM

## 2012-09-12 DIAGNOSIS — Q791 Other congenital malformations of diaphragm: Secondary | ICD-10-CM

## 2012-09-12 DIAGNOSIS — Z880 Allergy status to penicillin: Secondary | ICD-10-CM

## 2012-09-12 DIAGNOSIS — Z7902 Long term (current) use of antithrombotics/antiplatelets: Secondary | ICD-10-CM

## 2012-09-12 DIAGNOSIS — I6350 Cerebral infarction due to unspecified occlusion or stenosis of unspecified cerebral artery: Principal | ICD-10-CM

## 2012-09-12 DIAGNOSIS — Z8249 Family history of ischemic heart disease and other diseases of the circulatory system: Secondary | ICD-10-CM

## 2012-09-12 DIAGNOSIS — G459 Transient cerebral ischemic attack, unspecified: Secondary | ICD-10-CM | POA: Diagnosis present

## 2012-09-12 DIAGNOSIS — R972 Elevated prostate specific antigen [PSA]: Secondary | ICD-10-CM

## 2012-09-12 DIAGNOSIS — Z833 Family history of diabetes mellitus: Secondary | ICD-10-CM

## 2012-09-12 LAB — CBC WITH DIFFERENTIAL/PLATELET
Basophils Absolute: 0 10*3/uL (ref 0.0–0.1)
Basophils Relative: 0 % (ref 0–1)
HCT: 47 % (ref 39.0–52.0)
Hemoglobin: 17 g/dL (ref 13.0–17.0)
Lymphocytes Relative: 26 % (ref 12–46)
MCHC: 36.2 g/dL — ABNORMAL HIGH (ref 30.0–36.0)
Monocytes Absolute: 0.4 10*3/uL (ref 0.1–1.0)
Neutro Abs: 4.1 10*3/uL (ref 1.7–7.7)
Neutrophils Relative %: 65 % (ref 43–77)
RDW: 13.5 % (ref 11.5–15.5)
WBC: 6.2 10*3/uL (ref 4.0–10.5)

## 2012-09-12 LAB — URINALYSIS, ROUTINE W REFLEX MICROSCOPIC
Ketones, ur: NEGATIVE mg/dL
Nitrite: NEGATIVE
Protein, ur: 30 mg/dL — AB
Urobilinogen, UA: 1 mg/dL (ref 0.0–1.0)

## 2012-09-12 LAB — URINE MICROSCOPIC-ADD ON

## 2012-09-12 LAB — POCT I-STAT TROPONIN I

## 2012-09-12 LAB — COMPREHENSIVE METABOLIC PANEL
ALT: 53 U/L (ref 0–53)
AST: 71 U/L — ABNORMAL HIGH (ref 0–37)
Albumin: 3.9 g/dL (ref 3.5–5.2)
Calcium: 9.7 mg/dL (ref 8.4–10.5)
Creatinine, Ser: 0.86 mg/dL (ref 0.50–1.35)
GFR calc non Af Amer: 85 mL/min — ABNORMAL LOW (ref >90)
Sodium: 140 mEq/L (ref 135–145)
Total Protein: 7.4 g/dL (ref 6.0–8.3)

## 2012-09-12 LAB — PROTIME-INR: INR: 1.04 (ref 0.00–1.49)

## 2012-09-12 NOTE — ED Notes (Signed)
Pt reported Feeling dizzy while in shower this AM. Pt states he has a Hx of vertigo but this feels different. Pt denies any N/V/D.

## 2012-09-12 NOTE — ED Provider Notes (Signed)
History     CSN: [id number]  Arrival date & time 09/12/12  1539   First MD Initiated Contact with Patient 09/12/12 1632      Chief Complaint  Patient presents with  . Dizziness    (Consider location/radiation/quality/duration/timing/severity/associated sxs/prior treatment) The history is provided by the patient, the EMS personnel and the spouse.   72 year old male. Primary care Dr. is Alroy Dust, at approximately 10 AM patient had trouble walking gait disturbance trouble with coordination. And also spouse noted that he seemed to be confused had some memory problems. Patient was brought in by EMS at about 4:00 in the afternoon. Still had some of the gait disturbance. Patient was alert never really had dizziness or vertigo just the balance was off. No nausea vomiting or diarrhea no headache no chest pain no shortness of breath.  Past Medical History  Diagnosis Date  . Congenital anomaly of diaphragm   . HTN (hypertension)   . 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 Personal history of noncompliance with medical treatment, presenting hazards to health   . Cerebrovascular disease   . Unspecified intracranial hemorrhage   . Low HDL (under 40)   . Abnormal LFTs   . Adenomatous colon polyp 11/2005    TA polyp 04/2012  . Hemorrhoid   . Right inguinal hernia   . BPH (benign prostatic hyperplasia)   . Increased prostate specific antigen (PSA) velocity   . Hematuria   . Renal cyst   . Back pain   . Nondependent alcohol abuse, in remission     Past Surgical History  Procedure Laterality Date  . Cystoscopy and retrograde pyleogram    . Hernia repair  1965 & 1980's &2008    bilateral  . Liver biopsy  1980's    Family History  Problem Relation Age of Onset  . Heart disease Maternal Grandmother   . Rheum arthritis Maternal Grandmother   . Rheum arthritis Mother   . Diabetes Mother   . Diabetes Maternal Grandmother   . Stroke Mother   . Stroke Maternal  Grandmother   . Colon cancer Neg Hx     History  Substance Use Topics  . Smoking status: Former Smoker -- 1.00 packs/day for 4 years    Quit date: 11/30/1981  . Smokeless tobacco: Never Used  . Alcohol Use: No      Review of Systems  Constitutional: Negative for fever.  HENT: Negative for congestion.   Gastrointestinal: Negative for nausea, vomiting and abdominal pain.  Genitourinary: Negative for dysuria.  Musculoskeletal: Negative for back pain.  Skin: Negative for rash.  Neurological: Positive for weakness. Negative for dizziness, numbness and headaches.  Hematological: Does not bruise/bleed easily.  Psychiatric/Behavioral: Positive for confusion.    Allergies  Penicillins  Home Medications   No current outpatient prescriptions on file.  BP 147/81  Pulse 63  Temp(Src) 97.7 F (36.5 C) (Oral)  Resp 18  SpO2 97%  Physical Exam  Nursing note and vitals reviewed. Constitutional: He is oriented to person, place, and time. He appears well-developed and well-nourished.  HENT:  Head: Normocephalic and atraumatic.  Mouth/Throat: Oropharynx is clear and moist.  Eyes: Conjunctivae and EOM are normal. Pupils are equal, round, and reactive to light.  Neck: Normal range of motion. Neck supple.  Cardiovascular: Normal rate and regular rhythm.   No murmur heard. Pulmonary/Chest: Effort normal and breath sounds normal.  Abdominal: Soft. Bowel sounds are normal. There is no tenderness.  Musculoskeletal: Normal range  of motion.  Neurological: He is alert and oriented to person, place, and time. No cranial nerve deficit. He exhibits normal muscle tone. Coordination abnormal.  Upon presentation to the emergency department patient had abnormal gait struggle to walk. However when supine no obvious motor weakness.  Skin: Skin is warm. No rash noted.    ED Course  Procedures (including critical care time)  Labs Reviewed  CBC WITH DIFFERENTIAL - Abnormal; Notable for the  following:    MCHC 36.2 (*)    All other components within normal limits  URINALYSIS, ROUTINE W REFLEX MICROSCOPIC - Abnormal; Notable for the following:    Color, Urine AMBER (*)    Protein, ur 30 (*)    Leukocytes, UA MODERATE (*)    All other components within normal limits  COMPREHENSIVE METABOLIC PANEL - Abnormal; Notable for the following:    AST 71 (*)    GFR calc non Af Amer 85 (*)    All other components within normal limits  URINE MICROSCOPIC-ADD ON - Abnormal; Notable for the following:    Squamous Epithelial / LPF FEW (*)    All other components within normal limits  PROTIME-INR  POCT I-STAT TROPONIN I   Dg Chest 2 View  09/12/2012  *RADIOLOGY REPORT*  Clinical Data: Dizziness  CHEST - 2 VIEW  Comparison: 05/03/2012  Findings: Stable elevation of the right hemidiaphragm.  Lungs are essentially clear.  No focal consolidation.  No pleural effusion or pneumothorax.  The heart is normal in size.  Degenerative changes of the visualized thoracolumbar spine.  IMPRESSION: No evidence of acute cardiopulmonary disease.   Original Report Authenticated By: Charline Bills, M.D.    Ct Head Wo Contrast  09/12/2012  *RADIOLOGY REPORT*  Clinical Data: Unsteady gait.  Dizziness.  CT HEAD WITHOUT CONTRAST  Technique:  Contiguous axial images were obtained from the base of the skull through the vertex without contrast.  Comparison: 07/01/2004  Findings: There is no acute intracranial hemorrhage, infarction, or mass lesion.  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  The findings are stable since the prior exam.  Slight chronic dilatation of the lateral ventricles.  No osseous abnormality.  IMPRESSION: No acute abnormalities.  Extensive bilateral white matter ischemic changes including small old white matter infarcts.   Original Report Authenticated By: Francene Boyers, M.D.     Results for orders placed during the hospital encounter of 09/12/12   CBC WITH DIFFERENTIAL      Result Value Range   WBC 6.2  4.0 - 10.5 K/uL   RBC 5.31  4.22 - 5.81 MIL/uL   Hemoglobin 17.0  13.0 - 17.0 g/dL   HCT 16.1  09.6 - 04.5 %   MCV 88.5  78.0 - 100.0 fL   MCH 32.0  26.0 - 34.0 pg   MCHC 36.2 (*) 30.0 - 36.0 g/dL   RDW 40.9  81.1 - 91.4 %   Platelets 155  150 - 400 K/uL   Neutrophils Relative 65  43 - 77 %   Neutro Abs 4.1  1.7 - 7.7 K/uL   Lymphocytes Relative 26  12 - 46 %   Lymphs Abs 1.6  0.7 - 4.0 K/uL   Monocytes Relative 6  3 - 12 %   Monocytes Absolute 0.4  0.1 - 1.0 K/uL   Eosinophils Relative 2  0 - 5 %   Eosinophils Absolute 0.2  0.0 - 0.7 K/uL   Basophils Relative 0  0 -  1 %   Basophils Absolute 0.0  0.0 - 0.1 K/uL  PROTIME-INR      Result Value Range   Prothrombin Time 13.5  11.6 - 15.2 seconds   INR 1.04  0.00 - 1.49  URINALYSIS, ROUTINE W REFLEX MICROSCOPIC      Result Value Range   Color, Urine AMBER (*) YELLOW   APPearance CLEAR  CLEAR   Specific Gravity, Urine 1.022  1.005 - 1.030   pH 5.5  5.0 - 8.0   Glucose, UA NEGATIVE  NEGATIVE mg/dL   Hgb urine dipstick NEGATIVE  NEGATIVE   Bilirubin Urine NEGATIVE  NEGATIVE   Ketones, ur NEGATIVE  NEGATIVE mg/dL   Protein, ur 30 (*) NEGATIVE mg/dL   Urobilinogen, UA 1.0  0.0 - 1.0 mg/dL   Nitrite NEGATIVE  NEGATIVE   Leukocytes, UA MODERATE (*) NEGATIVE  COMPREHENSIVE METABOLIC PANEL      Result Value Range   Sodium 140  135 - 145 mEq/L   Potassium 3.8  3.5 - 5.1 mEq/L   Chloride 105  96 - 112 mEq/L   CO2 28  19 - 32 mEq/L   Glucose, Bld 79  70 - 99 mg/dL   BUN 16  6 - 23 mg/dL   Creatinine, Ser 4.09  0.50 - 1.35 mg/dL   Calcium 9.7  8.4 - 81.1 mg/dL   Total Protein 7.4  6.0 - 8.3 g/dL   Albumin 3.9  3.5 - 5.2 g/dL   AST 71 (*) 0 - 37 U/L   ALT 53  0 - 53 U/L   Alkaline Phosphatase 51  39 - 117 U/L   Total Bilirubin 0.5  0.3 - 1.2 mg/dL   GFR calc non Af Amer 85 (*) >90 mL/min   GFR calc Af Amer >90  >90 mL/min  URINE MICROSCOPIC-ADD ON      Result Value  Range   Squamous Epithelial / LPF FEW (*) RARE   WBC, UA 3-6  <3 WBC/hpf  POCT I-STAT TROPONIN I      Result Value Range   Troponin i, poc 0.00  0.00 - 0.08 ng/mL   Comment 3             Date: 09/12/2012  Rate: 62  Rhythm: normal sinus rhythm  QRS Axis: normal  Intervals: normal  ST/T Wave abnormalities: nonspecific ST changes  Conduction Disutrbances:left anterior fascicular block  Narrative Interpretation:   Old EKG Reviewed: none available Also a first degree AV block.   1. CVA (cerebral infarction)   2. Altered mental status   3. Essential hypertension, malignant       MDM  Head to get an MRI of the brain on the patient the patient suspicious for CVA TIA. Patient with acute onset at around 10 in the morning of not dizziness or vertigo but discoordination with his gait. Spouse also said that his memory has not been very strong. As the day he went on the gait disturbance corrected itself this evening however memory deficit still present. Patient's workup with head CT negative for acute head bleed or any evidence of a stroke. MRI was not able to get done because it was after 7 in the evening. Patient's workup otherwise without explanation for his symptoms chest x-ray was negative EKG without arrhythmia. Patient will be admitted by the hospitalist team for TIA.  Code stroke was not called because patient was having significant improvement in his symptoms you know he still has some gait disturbance it was  improving upon arrival.        Shelda Jakes, MD 09/12/12 [phone]

## 2012-09-12 NOTE — ED Notes (Signed)
Pt denies "dizziness" or feeling like the room is spinning. Pt states he felt "light headed" upon standing up but denies symptoms at this time.

## 2012-09-12 NOTE — ED Notes (Signed)
MD at bedside. 

## 2012-09-12 NOTE — ED Notes (Signed)
Admitting MD at bedside.

## 2012-09-12 NOTE — H&P (Addendum)
[facility] History and Physical  MAT STUARD ZOX:[id number] DOB: 12-Sep-1940    PCP:   Michele Mcalpine, MD   Chief Complaint: transcient confusion and vertigo.  HPI: Dustin Hamilton is an 72 y.o. male with hx of prior CVA, HTN, hx of intermittent vertigo, presents to the ER with transcient confusion and vertigo.  He came back from church and wife noted that he was confused and experienced some vertigo.  He was not having vertigo for many years.  There has been no HA, nausea, vomiting, fever or chills.  He returned to his baseline when presented to the hospital. Evaluation included a negative head CT, unremarkable serology.  Hospitalist was asked to admit him for AMS ( confused, didn't remember events), and for vertigo.  Rewiew of Systems:  Constitutional: Negative for malaise, fever and chills. No significant weight loss or weight gain Eyes: Negative for eye pain, redness and discharge, diplopia, visual changes, or flashes of light. ENMT: Negative for ear pain, hoarseness, nasal congestion, sinus pressure and sore throat. No headaches; tinnitus, drooling, or problem swallowing. Cardiovascular: Negative for chest pain, palpitations, diaphoresis, dyspnea and peripheral edema. ; No orthopnea, PND Respiratory: Negative for cough, hemoptysis, wheezing and stridor. No pleuritic chestpain. Gastrointestinal: Negative for nausea, vomiting, diarrhea, constipation, abdominal pain, melena, blood in stool, hematemesis, jaundice and rectal bleeding.    Genitourinary: Negative for frequency, dysuria, incontinence,flank pain and hematuria; Musculoskeletal: Negative for back pain and neck pain. Negative for swelling and trauma.;  Skin: . Negative for pruritus, rash, abrasions, bruising and skin lesion.; ulcerations Neuro: Negative for headache, lightheadedness and neck stiffness. Negative for weakness, altered level of consciousness,  extremity weakness, burning feet, involuntary movement, seizure  and syncope.  Psych: negative for anxiety, depression, insomnia, tearfulness, panic attacks, hallucinations, paranoia, suicidal or homicidal ideation    Past Medical History  Diagnosis Date  . Congenital anomaly of diaphragm   . HTN (hypertension)   . 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 Personal history of noncompliance with medical treatment, presenting hazards to health   . Cerebrovascular disease   . Unspecified intracranial hemorrhage   . Low HDL (under 40)   . Abnormal LFTs   . Adenomatous colon polyp 11/2005    TA polyp 04/2012  . Hemorrhoid   . Right inguinal hernia   . BPH (benign prostatic hyperplasia)   . Increased prostate specific antigen (PSA) velocity   . Hematuria   . Renal cyst   . Back pain   . Nondependent alcohol abuse, in remission     Past Surgical History  Procedure Laterality Date  . Cystoscopy and retrograde pyleogram    . Hernia repair  1965 & 1980's &2008    bilateral  . Liver biopsy  1980's    Medications:  HOME MEDS: Prior to Admission medications   Medication Sig Start Date End Date Taking? Authorizing Provider  amLODipine (NORVASC) 10 MG tablet Take 1 tablet (10 mg total) by mouth daily. 05/03/12  Yes Michele Mcalpine, MD  aspirin 325 MG tablet Take 325 mg by mouth daily.     Yes Historical Provider, MD  furosemide (LASIX) 40 MG tablet Take 1 tablet (40 mg total) by mouth daily. 05/03/12  Yes Michele Mcalpine, MD  hydrALAZINE (APRESOLINE) 25 MG tablet Take 1 tablet (25 mg total) by mouth 2 (two) times daily. 05/03/12  Yes Michele Mcalpine, MD  labetalol (NORMODYNE) 200 MG tablet Take 1 tablet (200 mg total) by mouth 2 (two)  times daily. 05/03/12  Yes Michele Mcalpine, MD  lisinopril (PRINIVIL,ZESTRIL) 20 MG tablet Take 1 tablet (20 mg total) by mouth daily. 05/03/12  Yes Michele Mcalpine, MD  tadalafil (CIALIS) 20 MG tablet Take 1 tablet (20 mg total) by mouth daily as needed. 05/03/12  Yes Michele Mcalpine, MD     Allergies:   Allergies  Allergen Reactions  . Penicillins     REACTION: hives    Social History:   reports that he quit smoking about 30 years ago. He has never used smokeless tobacco. He reports that he does not drink alcohol or use illicit drugs.  Family History: Family History  Problem Relation Age of Onset  . Heart disease Maternal Grandmother   . Rheum arthritis Maternal Grandmother   . Rheum arthritis Mother   . Diabetes Mother   . Diabetes Maternal Grandmother   . Stroke Mother   . Stroke Maternal Grandmother   . Colon cancer Neg Hx      Physical Exam: Filed Vitals:   09/12/12 1900 09/12/12 1930 09/12/12 2201 09/12/12 2350  BP: 157/101 147/81  143/80  Pulse: 65 63  62  Temp:   97.7 F (36.5 C) 97.6 F (36.4 C)  TempSrc:    Oral  Resp: 17 18  18   Height:    5\' 11"  (1.803 m)  Weight:    83.3 kg (183 lb 10.3 oz)  SpO2: 94% 97%  94%   Blood pressure 143/80, pulse 62, temperature 97.6 F (36.4 C), temperature source Oral, resp. rate 18, height 5\' 11"  (1.803 m), weight 83.3 kg (183 lb 10.3 oz), SpO2 94.00%.  GEN:  Pleasant  patient lying in the stretcher in no acute distress; cooperative with exam. PSYCH:  alert and oriented x4; does not appear anxious or depressed; affect is appropriate. HEENT: Mucous membranes pink and anicteric; PERRLA; EOM intact; no cervical lymphadenopathy nor thyromegaly or carotid bruit; no JVD; There were no stridor. Neck is very supple. Breasts:: Not examined CHEST WALL: No tenderness CHEST: Normal respiration, clear to auscultation bilaterally.  HEART: Regular rate and rhythm.  There are no murmur, rub, or gallops.   BACK: No kyphosis or scoliosis; no CVA tenderness ABDOMEN: soft and non-tender; no masses, no organomegaly, normal abdominal bowel sounds; no pannus; no intertriginous candida. There is no rebound and no distention. Rectal Exam: Not done EXTREMITIES: No bone or joint deformity; age-appropriate arthropathy of the hands and knees; no  edema; no ulcerations.  There is no calf tenderness. Genitalia: not examined PULSES: 2+ and symmetric SKIN: Normal hydration no rash or ulceration CNS: Cranial nerves 2-12 grossly intact no focal lateralizing neurologic deficit.  Speech is fluent; uvula elevated with phonation, facial symmetry and tongue midline. DTR are normal bilaterally, cerebella exam is intact, barbinski is negative and strengths are equaled bilaterally.  No sensory loss.   Labs on Admission:  Basic Metabolic Panel:  Recent Labs Lab 09/12/12 1725  NA 140  K 3.8  CL 105  CO2 28  GLUCOSE 79  BUN 16  CREATININE 0.86  CALCIUM 9.7   Liver Function Tests:  Recent Labs Lab 09/12/12 1725  AST 71*  ALT 53  ALKPHOS 51  BILITOT 0.5  PROT 7.4  ALBUMIN 3.9   No results found for this basename: LIPASE, AMYLASE,  in the last 168 hours No results found for this basename: AMMONIA,  in the last 168 hours CBC:  Recent Labs Lab 09/12/12 1725  WBC 6.2  NEUTROABS 4.1  HGB  17.0  HCT 47.0  MCV 88.5  PLT 155   Cardiac Enzymes: No results found for this basename: CKTOTAL, CKMB, CKMBINDEX, TROPONINI,  in the last 168 hours  CBG: No results found for this basename: GLUCAP,  in the last 168 hours   Radiological Exams on Admission: Dg Chest 2 View  09/12/2012  *RADIOLOGY REPORT*  Clinical Data: Dizziness  CHEST - 2 VIEW  Comparison: 05/03/2012  Findings: Stable elevation of the right hemidiaphragm.  Lungs are essentially clear.  No focal consolidation.  No pleural effusion or pneumothorax.  The heart is normal in size.  Degenerative changes of the visualized thoracolumbar spine.  IMPRESSION: No evidence of acute cardiopulmonary disease.   Original Report Authenticated By: Charline Bills, M.D.    Ct Head Wo Contrast  09/12/2012  *RADIOLOGY REPORT*  Clinical Data: Unsteady gait.  Dizziness.  CT HEAD WITHOUT CONTRAST  Technique:  Contiguous axial images were obtained from the base of the skull through the vertex  without contrast.  Comparison: 07/01/2004  Findings: There is no acute intracranial hemorrhage, infarction, or mass lesion.  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  The findings are stable since the prior exam.  Slight chronic dilatation of the lateral ventricles.  No osseous abnormality.  IMPRESSION: No acute abnormalities.  Extensive bilateral white matter ischemic changes including small old white matter infarcts.   Original Report Authenticated By: Francene Boyers, M.D.     Assessment/Plan Present on Admission:  . Vertigo . HYPERTENSION, SEVERE . CEREBROVASCULAR DISEASE Altered mental status.  PLAN:  Given both symptoms of vertigo and confusion, I am concerned about a TIA.  He is not on any medication causing confusion.  Currently, he is at his baseline.  Will admit him for TIA/CVA work up, to include MRI/MRA, coratid doppler, and cardiac ECHO.  I have continued his home meds as well.  He is stable, full code, and will be admitted to [facility] service.  Thank you for allowing me to partake in the care of your nice patient.  Other plans as per orders.  Code Status: FULL Unk Lightning, MD. [facility] Pager [phone] 7pm to 7am.  09/12/2012, 11:57 PM

## 2012-09-13 ENCOUNTER — Observation Stay (HOSPITAL_COMMUNITY): Payer: Medicare Other

## 2012-09-13 DIAGNOSIS — I639 Cerebral infarction, unspecified: Secondary | ICD-10-CM | POA: Diagnosis present

## 2012-09-13 DIAGNOSIS — I517 Cardiomegaly: Secondary | ICD-10-CM

## 2012-09-13 DIAGNOSIS — G459 Transient cerebral ischemic attack, unspecified: Secondary | ICD-10-CM

## 2012-09-13 DIAGNOSIS — I119 Hypertensive heart disease without heart failure: Secondary | ICD-10-CM

## 2012-09-13 HISTORY — PX: TRANSTHORACIC ECHOCARDIOGRAM: SHX275

## 2012-09-13 HISTORY — DX: Cerebral infarction, unspecified: I63.9

## 2012-09-13 LAB — LIPID PANEL
HDL: 25 mg/dL — ABNORMAL LOW (ref >39)
LDL Cholesterol: 76 mg/dL (ref 0–99)
Triglycerides: 112 mg/dL (ref <150)
VLDL: 22 mg/dL (ref 0–40)

## 2012-09-13 LAB — CBC
MCH: 31 pg (ref 26.0–34.0)
MCV: 88.1 fL (ref 78.0–100.0)
Platelets: 143 10*3/uL — ABNORMAL LOW (ref 150–400)
RDW: 13.6 % (ref 11.5–15.5)
WBC: 5.2 10*3/uL (ref 4.0–10.5)

## 2012-09-13 LAB — RAPID URINE DRUG SCREEN, HOSP PERFORMED
Amphetamines: NOT DETECTED
Benzodiazepines: NOT DETECTED
Opiates: NOT DETECTED

## 2012-09-13 LAB — CREATININE, SERUM: Creatinine, Ser: 0.89 mg/dL (ref 0.50–1.35)

## 2012-09-13 LAB — HEMOGLOBIN A1C: Mean Plasma Glucose: 131 mg/dL — ABNORMAL HIGH (ref <117)

## 2012-09-13 MED ORDER — LISINOPRIL 20 MG PO TABS
20.0000 mg | ORAL_TABLET | Freq: Every day | ORAL | Status: DC
  Administered 2012-09-13: 20 mg via ORAL
  Filled 2012-09-13: qty 1

## 2012-09-13 MED ORDER — CLOPIDOGREL BISULFATE 75 MG PO TABS: 75.0000 mg | ORAL_TABLET | Freq: Every day | ORAL | Status: DC

## 2012-09-13 MED ORDER — SODIUM CHLORIDE 0.9 % IV SOLN
INTRAVENOUS | Status: DC
  Administered 2012-09-13: 06:00:00 via INTRAVENOUS

## 2012-09-13 MED ORDER — LABETALOL HCL 200 MG PO TABS
200.0000 mg | ORAL_TABLET | Freq: Two times a day (BID) | ORAL | Status: DC
  Administered 2012-09-13: 200 mg via ORAL
  Filled 2012-09-13 (×3): qty 1

## 2012-09-13 MED ORDER — ENOXAPARIN SODIUM 40 MG/0.4ML ~~LOC~~ SOLN
40.0000 mg | SUBCUTANEOUS | Status: DC
  Administered 2012-09-13: 40 mg via SUBCUTANEOUS
  Filled 2012-09-13: qty 0.4

## 2012-09-13 MED ORDER — ASPIRIN EC 325 MG PO TBEC
325.0000 mg | DELAYED_RELEASE_TABLET | Freq: Every day | ORAL | Status: DC
  Administered 2012-09-13: 325 mg via ORAL
  Filled 2012-09-13: qty 1

## 2012-09-13 MED ORDER — HYDRALAZINE HCL 25 MG PO TABS
25.0000 mg | ORAL_TABLET | Freq: Two times a day (BID) | ORAL | Status: DC
  Administered 2012-09-13: 25 mg via ORAL
  Filled 2012-09-13 (×3): qty 1

## 2012-09-13 MED ORDER — AMLODIPINE BESYLATE 10 MG PO TABS
10.0000 mg | ORAL_TABLET | Freq: Every day | ORAL | Status: DC
  Administered 2012-09-13: 10 mg via ORAL
  Filled 2012-09-13: qty 1

## 2012-09-13 MED ORDER — ASPIRIN 81 MG PO TABS: 81.0000 mg | ORAL_TABLET | Freq: Every day | ORAL | Status: DC

## 2012-09-13 MED ORDER — FUROSEMIDE 40 MG PO TABS
40.0000 mg | ORAL_TABLET | Freq: Every day | ORAL | Status: DC
  Administered 2012-09-13: 40 mg via ORAL
  Filled 2012-09-13: qty 1

## 2012-09-13 MED ORDER — ENOXAPARIN SODIUM 40 MG/0.4ML ~~LOC~~ SOLN: 40.0000 mg | SUBCUTANEOUS | Status: DC

## 2012-09-13 NOTE — Progress Notes (Signed)
  Echocardiogram 2D Echocardiogram has been performed.  Dustin Hamilton 09/13/2012, 10:52 AM

## 2012-09-13 NOTE — Progress Notes (Signed)
DC orders received.  Patient stable with no S/S of distress.  Medication and discharge information reviewed with patient.  Patient DC home with wife. Dustin Hamilton  

## 2012-09-13 NOTE — Discharge Summary (Addendum)
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    HYPERTENSION, SEVERE   HYPERTENSIVE CARDIOVASCULAR DISEASE   CEREBROVASCULAR DISEASE   Vertigo   Discharge Condition: good, neurologically intact  Diet recommendation: heart healthy  Filed Weights   09/12/12 2350  Weight: 83.3 kg (183 lb 10.3 oz)    History of present illness:  Dustin Hamilton is an 72 y.o. male with hx of prior CVA, HTN, hx of intermittent vertigo, presents to the ER with transcient confusion and vertigo. He came back from church and wife noted that he was confused and experienced some vertigo. He was not having vertigo for many years. There has been no HA, nausea, vomiting, fever or chills. He returned to his baseline when presented to the hospital. Evaluation included a negative head CT, unremarkable serology. Hospitalist was asked to admit him for AMS ( confused, didn't remember events), and for vertigo.   Hospital Course:  Patient was admitted with a change in mental status. He had emotional lability, vertigo. He had a history of hypertension and cerebrovascular disease. He was placed on telemetry and had frequent neurological exams. His symptoms resolved. MRI of the brain indicated a small stroke at the hypocampus amigdala junction. The patient was seen in consultation by neurology and they recommended we add Plavix to the aspirin regimen.Patient was found to be neurologically intact back to baseline by Dr. Thad Ranger and she did not recommend further PT/OT evaluation.  All other medications are being continued without change  Procedures: Echocardiogram LV EF: 60% - 65%  ------------------------------------------------------------ Indications: TIA  435.9.  ------------------------------------------------------------ History: PMH: Stroke. Risk factors: Hypertension.  ------------------------------------------------------------ Study Conclusions  Left ventricle: The cavity size was normal. Wall thickness was increased in a pattern of moderate LVH. Systolic function was normal. The estimated ejection fraction was in the range of 60% to 65%. Wall motion was normal; there were no regional wall motion abnormalities. Transthoracic echocardiography. M-mode, complete 2D, spectral Doppler, and color Doppler. Height: Height: 180.3cm. Height: 71in. Weight: Weight: 83.3kg. Weight: 183.3lb. Body mass index: BMI: 25.6kg/m^2. Body surface area: BSA: 2.69m^2. Blood pressure: 164/96. Patient status: Inpatient. Location: Echo laboratory.  Carotid Dopplers Which were negative for ICA or vertebral stenoses   Consultations:  neurology  Discharge Exam: Filed Vitals:   09/13/12 0800 09/13/12 1245 09/13/12 1309 09/13/12 1600  BP: 164/96 165/110  138/84  Pulse: 69 71 73 65  Temp: 98.2 F (36.8 C)  97.1 F (36.2 C) 98 F (36.7 C)  TempSrc: Oral  Oral Oral  Resp: 18   18  Height:      Weight:      SpO2: 96%  95% 94%    General: alert and oriented x3 Cardiovascular: regular rate and rhythm without murmurs rubs or gallops Respiratory: clear to auscultation bilaterally  Discharge Instructions  Discharge Orders   Future Orders Complete By Expires     Diet - low sodium heart healthy  As directed     Increase activity slowly  As directed         Medication List    TAKE these medications       amLODipine 10 MG tablet  Commonly known as:  NORVASC  Take 1 tablet (10 mg total) by mouth daily.     aspirin 81 MG tablet  Take 1 tablet (81 mg total) by mouth daily.     clopidogrel 75 MG tablet  Commonly known as:  PLAVIX  Take 1 tablet (75 mg total) by mouth daily with breakfast.  Start taking on:  09/14/2012     furosemide 40 MG  tablet  Commonly known as:  LASIX  Take 1 tablet (40 mg total) by mouth daily.     hydrALAZINE 25 MG tablet  Commonly known as:  APRESOLINE  Take 1 tablet (25 mg total) by mouth 2 (two) times daily.     labetalol 200 MG tablet  Commonly known as:  NORMODYNE  Take 1 tablet (200 mg total) by mouth 2 (two) times daily.     lisinopril 20 MG tablet  Commonly known as:  PRINIVIL,ZESTRIL  Take 1 tablet (20 mg total) by mouth daily.     tadalafil 20 MG tablet  Commonly known as:  CIALIS  Take 1 tablet (20 mg total) by mouth daily as needed.           Follow-up Information   Schedule an appointment as soon as possible for a visit with Michele Mcalpine, MD.   Contact information:   [address] [place] [address] [phone]        The results of significant diagnostics from this hospitalization (including imaging, microbiology, ancillary and laboratory) are listed below for reference.    Significant Diagnostic Studies: Dg Chest 2 View  09/12/2012  *RADIOLOGY REPORT*  Clinical Data: Dizziness  CHEST - 2 VIEW  Comparison: 05/03/2012  Findings: Stable elevation of the right hemidiaphragm.  Lungs are essentially clear.  No focal consolidation.  No pleural effusion or pneumothorax.  The heart is normal in size.  Degenerative changes of the visualized thoracolumbar spine.  IMPRESSION: No evidence of acute cardiopulmonary disease.   Original Report Authenticated By: Charline Bills, M.D.    Ct Head Wo Contrast  09/12/2012  *RADIOLOGY REPORT*  Clinical Data: Unsteady gait.  Dizziness.  CT HEAD WITHOUT CONTRAST  Technique:  Contiguous axial images were obtained from the base of the skull through the vertex without contrast.  Comparison: 07/01/2004  Findings: There is no acute intracranial hemorrhage, infarction, or mass lesion.  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  The findings are stable since the prior exam.  Slight chronic  dilatation of the lateral ventricles.  No osseous abnormality.  IMPRESSION: No acute abnormalities.  Extensive bilateral white matter ischemic changes including small old white matter infarcts.   Original Report Authenticated By: Francene Boyers, M.D.    Mri Brain Without Contrast  09/13/2012  *RADIOLOGY REPORT*  Clinical Data: Dizziness.  Hypertension.  MRI HEAD WITHOUT CONTRAST,MRA HEAD WITHOUT CONTRAST  Technique:  Multiplanar, multiecho pulse sequences of the brain and surrounding structures were obtained according to standard protocol without intravenous contrast.,Technique: Angiographic images of the Circle of Willis were obtained using MRA technique  Comparison: 09/12/2012 CT.  No comparison MR.  Findings: Focal area of restricted motion involving the left hippocampus / amygdala junction.  In this setting, findings most suggestive of small acute infarct rather than result of infection or seizure.  Prominent small vessel disease type changes.  Several remote scattered small infarcts.  Multiple scattered areas of blood breakdown products most consistent with result of prior hemorrhagic ischemia.  No intracranial mass lesion detected on this unenhanced exam.  Global atrophy without hydrocephalus.  Transverse ligament hypertrophy.  Cervical medullary junction, tumor, pineal region and orbital structures unremarkable.  Major intracranial vascular structures are patent with ectatic basilar artery causing superior displacement of the hypothalamus.  Minimal partial opacification inferior mastoid air cells.  IMPRESSION: Small infarct at the junction of the left hippocampus/ amygdala suspected as noted above.  This has been made a PRA call report utilizing dashboard call feature.  MRA HEAD WITHOUT CONTRAST  Technique:  Angiographic images of the Circle of Willis were obtained using MRA technique without intravenous contrast.  Findings:  Anterior circulation without medium or large size vessel significant stenosis or  occlusion.  Middle cerebral artery and A2 segment anterior cerebral artery branch vessel irregularity bilaterally.  Ectatic vertebral arteries and basilar artery without high-grade stenosis. Mild narrowing and irregularity distal left vertebral artery.  Nonvisualization left PICA and right AICA.  Mild irregularity superior cerebellar artery bilaterally.  Mild irregularity involving portions of the posterior cerebral artery bilaterally most notable distal branches.  No aneurysm noted.  IMPRESSION: Branch vessel atherosclerotic type changes as noted above.   Original Report Authenticated By: Lacy Duverney, M.D.    US Abdomen Complete  08/20/2012  *RADIOLOGY REPORT*  Clinical Data:  Hepatitis B.  COMPLETE ABDOMINAL ULTRASOUND  Comparison:  02/26/2011  Findings:  Gallbladder:  No gallstones, gallbladder wall thickening, or pericholecystic fluid. Negative sonographic Murphy's sign.  Common bile duct:  Normal.  4.8 mm in diameter.  Liver:  No focal lesions.  Parenchyma appears normal.  IVC:  Normal.  Pancreas:  Head and body of the pancreas are normal.  Tail is obscured by bowel gas.  Spleen:  Normal.  7.0 cm in length.  Right Kidney:  12.0 cm in length.  Normal.  Left Kidney:  12.5 cm in length.  7 mm cyst in the upper pole.  4.9 cm cyst in the lower pole.  1.6 cm cyst just above the larger cyst.  Abdominal aorta:  No aneurysm identified.  2.4 cm maximal diameter. Slight atherosclerosis.  IMPRESSION: The liver appears normal. Several benign appearing cysts in the left kidney.   Original Report Authenticated By: Francene Boyers, M.D.    Mr Mra Head/brain Wo Cm  09/13/2012  *RADIOLOGY REPORT*  Clinical Data: Dizziness.  Hypertension.  MRI HEAD WITHOUT CONTRAST,MRA HEAD WITHOUT CONTRAST  Technique:  Multiplanar, multiecho pulse sequences of the brain and surrounding structures were obtained according to standard protocol without intravenous contrast.,Technique: Angiographic images of the Circle of Willis were obtained  using MRA technique  Comparison: 09/12/2012 CT.  No comparison MR.  Findings: Focal area of restricted motion involving the left hippocampus / amygdala junction.  In this setting, findings most suggestive of small acute infarct rather than result of infection or seizure.  Prominent small vessel disease type changes.  Several remote scattered small infarcts.  Multiple scattered areas of blood breakdown products most consistent with result of prior hemorrhagic ischemia.  No intracranial mass lesion detected on this unenhanced exam.  Global atrophy without hydrocephalus.  Transverse ligament hypertrophy.  Cervical medullary junction, tumor, pineal region and orbital structures unremarkable.  Major intracranial vascular structures are patent with ectatic basilar artery causing superior displacement of the hypothalamus.  Minimal partial opacification inferior mastoid air cells.  IMPRESSION: Small infarct at the junction of the left hippocampus/ amygdala suspected as noted above.  This has been made a PRA call report utilizing dashboard call feature.  MRA HEAD WITHOUT CONTRAST  Technique:  Angiographic images of the Circle of Willis were obtained using MRA technique without intravenous contrast.  Findings:  Anterior circulation without medium or large size vessel significant  stenosis or occlusion.  Middle cerebral artery and A2 segment anterior cerebral artery branch vessel irregularity bilaterally.  Ectatic vertebral arteries and basilar artery without high-grade stenosis. Mild narrowing and irregularity distal left vertebral artery.  Nonvisualization left PICA and right AICA.  Mild irregularity superior cerebellar artery bilaterally.  Mild irregularity involving portions of the posterior cerebral artery bilaterally most notable distal branches.  No aneurysm noted.  IMPRESSION: Branch vessel atherosclerotic type changes as noted above.   Original Report Authenticated By: Lacy Duverney, M.D.     Microbiology: No  results found for this or any previous visit (from the past 240 hour(s)).   Labs: Basic Metabolic Panel:  Recent Labs Lab 09/12/12 1725 09/13/12 0330  NA 140  --   K 3.8  --   CL 105  --   CO2 28  --   GLUCOSE 79  --   BUN 16  --   CREATININE 0.86 0.89  CALCIUM 9.7  --    Liver Function Tests:  Recent Labs Lab 09/12/12 1725  AST 71*  ALT 53  ALKPHOS 51  BILITOT 0.5  PROT 7.4  ALBUMIN 3.9   No results found for this basename: LIPASE, AMYLASE,  in the last 168 hours No results found for this basename: AMMONIA,  in the last 168 hours CBC:  Recent Labs Lab 09/12/12 1725 09/13/12 0330  WBC 6.2 5.2  NEUTROABS 4.1  --   HGB 17.0 16.1  HCT 47.0 45.7  MCV 88.5 88.1  PLT 155 143*   Cardiac Enzymes: No results found for this basename: CKTOTAL, CKMB, CKMBINDEX, TROPONINI,  in the last 168 hours BNP: BNP (last 3 results) No results found for this basename: PROBNP,  in the last 8760 hours CBG: No results found for this basename: GLUCAP,  in the last 168 hours     Signed:  Darnette Lampron  [facility] 09/13/2012, 5:45 PM

## 2012-09-13 NOTE — Consult Note (Signed)
Referring Physician: Dr. Lavera Guise    Chief Complaint: feeling slow  HPI: Dustin Hamilton is an 72 y.o. male who was getting ready for [facility] on 09/12/2012. His wife left earlier at 0805 and at 0815 he remembers feeling like he was generally slow and unable to move quickly. Says that it was like he was "20 seconds behind" himself. He finally drove to [place] and then back home. Others mentionned to him that he appeared tired. He felt generally weak but not one sided. Later that evening he told his wife that he thought he should come to the ED. She called 911.  Date last known well: 09/12/2012 Time last known well: 0815  tPA Given: No: out of window, ED arrival time is 1539.   He was taking a full strength 325mg  Aspirin daily at home. He said that atleast 5 years ago he had hernia surgery and he was told at that time that he had "old strokes" that he did not know anything about. No deficits.  Past Medical History  Diagnosis Date  . Congenital anomaly of diaphragm   . HTN (hypertension)   . 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 Personal history of noncompliance with medical treatment, presenting hazards to health   . Cerebrovascular disease   . Unspecified intracranial hemorrhage   . Low HDL (under 40)   . Abnormal LFTs   . Adenomatous colon polyp 11/2005    TA polyp 04/2012  . Hemorrhoid   . Right inguinal hernia   . BPH (benign prostatic hyperplasia)   . Increased prostate specific antigen (PSA) velocity   . Hematuria   . Renal cyst   . Back pain   . Nondependent alcohol abuse, in remission     Past Surgical History  Procedure Laterality Date  . Cystoscopy and retrograde pyleogram    . Hernia repair  1965 & 1980's &2008    bilateral  . Liver biopsy  1980's    Family History  Problem Relation Age of Onset  . Heart disease Maternal Grandmother   . Rheum arthritis Maternal Grandmother   . Rheum arthritis Mother   . Diabetes Mother   . Diabetes  Maternal Grandmother   . Stroke Mother   . Stroke Maternal Grandmother   . Colon cancer Neg Hx    Social History:  reports that he quit smoking about 30 years ago. He has never used smokeless tobacco. He reports that he does not drink alcohol or use illicit drugs.  Allergies:  Allergies  Allergen Reactions  . Penicillins     REACTION: hives    Current Facility-Administered Medications  Medication Dose Route Frequency Provider Last Rate Last Dose  . amLODipine (NORVASC) tablet 10 mg  10 mg Oral Daily Houston Siren, MD   10 mg at 09/13/12 1246  . aspirin EC tablet 325 mg  325 mg Oral Daily Houston Siren, MD   325 mg at 09/13/12 1247  . enoxaparin (LOVENOX) injection 40 mg  40 mg Subcutaneous Q24H Houston Siren, MD   40 mg at 09/13/12 1247  . furosemide (LASIX) tablet 40 mg  40 mg Oral Daily Houston Siren, MD   40 mg at 09/13/12 1246  . hydrALAZINE (APRESOLINE) tablet 25 mg  25 mg Oral BID Houston Siren, MD   25 mg at 09/13/12 1246  . labetalol (NORMODYNE) tablet 200 mg  200 mg Oral BID Houston Siren, MD   200 mg at 09/13/12 1247  . lisinopril (PRINIVIL,ZESTRIL) tablet 20  mg  20 mg Oral Daily Houston Siren, MD   20 mg at 09/13/12 1247     ROS: History obtained from chart review and the patient  General ROS: negative for - chills, fatigue, fever, night sweats, weight gain or weight loss Psychological ROS: negative for - behavioral disorder, hallucinations, memory difficulties, mood swings or suicidal ideation Ophthalmic ROS: negative for - blurry vision, double vision, eye pain or loss of vision ENT ROS: negative for - epistaxis, nasal discharge, oral lesions, sore throat, tinnitus or vertigo Allergy and Immunology ROS: negative for - hives or itchy/watery eyes Hematological and Lymphatic ROS: negative for - bleeding problems, bruising or swollen lymph nodes Endocrine ROS: negative for - galactorrhea, hair pattern changes, polydipsia/polyuria or temperature intolerance Respiratory ROS: negative for - cough, hemoptysis,  shortness of breath or wheezing Cardiovascular ROS: negative for - chest pain, dyspnea on exertion, edema or irregular heartbeat Gastrointestinal ROS: negative for - abdominal pain, diarrhea, hematemesis, nausea/vomiting or stool incontinence Genito-Urinary ROS: negative for - dysuria, hematuria, incontinence or urinary frequency/urgency Musculoskeletal ROS: negative for - joint swelling or muscular weakness Neurological ROS: as noted in HPI Dermatological ROS: negative for rash and skin lesion changes   Physical Examination: Blood pressure 138/84, pulse 65, temperature 98 F (36.7 C), temperature source Oral, resp. rate 18, height 5\' 11"  (1.803 m), weight 83.3 kg (183 lb 10.3 oz), SpO2 94.00%.  Neurologic Examination: Mental Status: Alert, oriented, thought content appropriate.  Speech fluent without evidence of aphasia.  Able to follow 3 step commands without difficulty. Cranial Nerves: II: visual fields grossly normal, pupils equal, round, reactive to light and accommodation III,IV, VI: ptosis not present, extra-ocular motions intact bilaterally V,VII: smile symmetric, facial light touch sensation normal bilaterally VIII: hearing normal bilaterally IX,X: gag reflex present XI: trapezius strength/neck flexion strength normal bilaterally XII: tongue strength normal  Motor: Right : Upper extremity   5/5 w/ pronator drift   Left:     Upper extremity   5/5  Lower extremity   5/5       Lower extremity   5/5 Tone and bulk:normal tone throughout; no atrophy noted Sensory: Pinprick and light touch intact throughout, bilaterally Deep Tendon Reflexes: 2+ and symmetric throughout Plantars: Right: downgoing   Left: downgoing Cerebellar: normal finger-to-nose and normal heel-to-shin test Heart: RRR Lungs: clear bilaterally Skin: warm, dry LE: palpable distal pulses, no edema   Results for orders placed during the hospital encounter of 09/12/12 (from the past 48 hour(s))  CBC WITH  DIFFERENTIAL     Status: Abnormal   Collection Time    09/12/12  5:25 PM      Result Value Range   WBC 6.2  4.0 - 10.5 K/uL   RBC 5.31  4.22 - 5.81 MIL/uL   Hemoglobin 17.0  13.0 - 17.0 g/dL   HCT 45.4  09.8 - 11.9 %   MCV 88.5  78.0 - 100.0 fL   MCH 32.0  26.0 - 34.0 pg   MCHC 36.2 (*) 30.0 - 36.0 g/dL   RDW 14.7  82.9 - 56.2 %   Platelets 155  150 - 400 K/uL   Neutrophils Relative 65  43 - 77 %   Neutro Abs 4.1  1.7 - 7.7 K/uL   Lymphocytes Relative 26  12 - 46 %   Lymphs Abs 1.6  0.7 - 4.0 K/uL   Monocytes Relative 6  3 - 12 %   Monocytes Absolute 0.4  0.1 - 1.0 K/uL   Eosinophils Relative  2  0 - 5 %   Eosinophils Absolute 0.2  0.0 - 0.7 K/uL   Basophils Relative 0  0 - 1 %   Basophils Absolute 0.0  0.0 - 0.1 K/uL  PROTIME-INR     Status: None   Collection Time    09/12/12  5:25 PM      Result Value Range   Prothrombin Time 13.5  11.6 - 15.2 seconds   INR 1.04  0.00 - 1.49  COMPREHENSIVE METABOLIC PANEL     Status: Abnormal   Collection Time    09/12/12  5:25 PM      Result Value Range   Sodium 140  135 - 145 mEq/L   Potassium 3.8  3.5 - 5.1 mEq/L   Chloride 105  96 - 112 mEq/L   CO2 28  19 - 32 mEq/L   Glucose, Bld 79  70 - 99 mg/dL   BUN 16  6 - 23 mg/dL   Creatinine, Ser 4.09  0.50 - 1.35 mg/dL   Calcium 9.7  8.4 - 81.1 mg/dL   Total Protein 7.4  6.0 - 8.3 g/dL   Albumin 3.9  3.5 - 5.2 g/dL   AST 71 (*) 0 - 37 U/L   ALT 53  0 - 53 U/L   Alkaline Phosphatase 51  39 - 117 U/L   Total Bilirubin 0.5  0.3 - 1.2 mg/dL   GFR calc non Af Amer 85 (*) >90 mL/min   GFR calc Af Amer >90  >90 mL/min   Comment:            The eGFR has been calculated     using the CKD EPI equation.     This calculation has not been     validated in all clinical     situations.     eGFR's persistently     <90 mL/min signify     possible Chronic Kidney Disease.  POCT I-STAT TROPONIN I     Status: None   Collection Time    09/12/12  5:44 PM      Result Value Range   Troponin i, poc  0.00  0.00 - 0.08 ng/mL   Comment 3            Comment: Due to the release kinetics of cTnI,     a negative result within the first hours     of the onset of symptoms does not rule out     myocardial infarction with certainty.     If myocardial infarction is still suspected,     repeat the test at appropriate intervals.  URINALYSIS, ROUTINE W REFLEX MICROSCOPIC     Status: Abnormal   Collection Time    09/12/12  6:02 PM      Result Value Range   Color, Urine AMBER (*) YELLOW   Comment: BIOCHEMICALS MAY BE AFFECTED BY COLOR   APPearance CLEAR  CLEAR   Specific Gravity, Urine 1.022  1.005 - 1.030   pH 5.5  5.0 - 8.0   Glucose, UA NEGATIVE  NEGATIVE mg/dL   Hgb urine dipstick NEGATIVE  NEGATIVE   Bilirubin Urine NEGATIVE  NEGATIVE   Ketones, ur NEGATIVE  NEGATIVE mg/dL   Protein, ur 30 (*) NEGATIVE mg/dL   Urobilinogen, UA 1.0  0.0 - 1.0 mg/dL   Nitrite NEGATIVE  NEGATIVE   Leukocytes, UA MODERATE (*) NEGATIVE  URINE MICROSCOPIC-ADD ON     Status: Abnormal   Collection Time  09/12/12  6:02 PM      Result Value Range   Squamous Epithelial / LPF FEW (*) RARE   WBC, UA 3-6  <3 WBC/hpf  URINE RAPID DRUG SCREEN (HOSP PERFORMED)     Status: None   Collection Time    09/12/12  6:02 PM      Result Value Range   Opiates NONE DETECTED  NONE DETECTED   Cocaine NONE DETECTED  NONE DETECTED   Benzodiazepines NONE DETECTED  NONE DETECTED   Amphetamines NONE DETECTED  NONE DETECTED   Tetrahydrocannabinol NONE DETECTED  NONE DETECTED   Barbiturates NONE DETECTED  NONE DETECTED   Comment:            DRUG SCREEN FOR MEDICAL PURPOSES     ONLY.  IF CONFIRMATION IS NEEDED     FOR ANY PURPOSE, NOTIFY LAB     WITHIN 5 DAYS.                LOWEST DETECTABLE LIMITS     FOR URINE DRUG SCREEN     Drug Class       Cutoff (ng/mL)     Amphetamine      1000     Barbiturate      200     Benzodiazepine   200     Tricyclics       300     Opiates          300     Cocaine          300     THC               50  HEMOGLOBIN A1C     Status: Abnormal   Collection Time    09/13/12  3:30 AM      Result Value Range   Hemoglobin A1C 6.2 (*) <5.7 %   Comment: (NOTE)                                                                               According to the ADA Clinical Practice Recommendations for 2011, when     HbA1c is used as a screening test:      >=6.5%   Diagnostic of Diabetes Mellitus               (if abnormal result is confirmed)     5.7-6.4%   Increased risk of developing Diabetes Mellitus     References:Diagnosis and Classification of Diabetes Mellitus,Diabetes     Care,2011,34(Suppl 1):S62-S69 and Standards of Medical Care in             Diabetes - 2011,Diabetes Care,2011,34 (Suppl 1):S11-S61.   Mean Plasma Glucose 131 (*) <117 mg/dL  CBC     Status: Abnormal   Collection Time    09/13/12  3:30 AM      Result Value Range   WBC 5.2  4.0 - 10.5 K/uL   RBC 5.19  4.22 - 5.81 MIL/uL   Hemoglobin 16.1  13.0 - 17.0 g/dL   HCT 16.1  09.6 - 04.5 %   MCV 88.1  78.0 - 100.0 fL   MCH 31.0  26.0 - 34.0 pg  MCHC 35.2  30.0 - 36.0 g/dL   RDW 82.9  56.2 - 13.0 %   Platelets 143 (*) 150 - 400 K/uL  CREATININE, SERUM     Status: Abnormal   Collection Time    09/13/12  3:30 AM      Result Value Range   Creatinine, Ser 0.89  0.50 - 1.35 mg/dL   GFR calc non Af Amer 84 (*) >90 mL/min   GFR calc Af Amer >90  >90 mL/min   Comment:            The eGFR has been calculated     using the CKD EPI equation.     This calculation has not been     validated in all clinical     situations.     eGFR's persistently     <90 mL/min signify     possible Chronic Kidney Disease.  LIPID PANEL     Status: Abnormal   Collection Time    09/13/12  3:30 AM      Result Value Range   Cholesterol 123  0 - 200 mg/dL   Triglycerides 865  <784 mg/dL   HDL 25 (*) >69 mg/dL   Total CHOL/HDL Ratio 4.9     VLDL 22  0 - 40 mg/dL   LDL Cholesterol 76  0 - 99 mg/dL   Comment:            Total  Cholesterol/HDL:CHD Risk     Coronary Heart Disease Risk Table                         Men   Women      1/2 Average Risk   3.4   3.3      Average Risk       5.0   4.4      2 X Average Risk   9.6   7.1      3 X Average Risk  23.4   11.0                Use the calculated Patient Ratio     above and the CHD Risk Table     to determine the patient's CHD Risk.                ATP III CLASSIFICATION (LDL):      <100     mg/dL   Optimal      629-528  mg/dL   Near or Above                        Optimal      130-159  mg/dL   Borderline      413-244  mg/dL   High      >010     mg/dL   Very High   Dg Chest 2 View  09/12/2012  *RADIOLOGY REPORT*  Clinical Data: Dizziness  CHEST - 2 VIEW  Comparison: 05/03/2012  Findings: Stable elevation of the right hemidiaphragm.  Lungs are essentially clear.  No focal consolidation.  No pleural effusion or pneumothorax.  The heart is normal in size.  Degenerative changes of the visualized thoracolumbar spine.  IMPRESSION: No evidence of acute cardiopulmonary disease.   Original Report Authenticated By: Charline Bills, M.D.    Ct Head Wo Contrast  09/12/2012  *RADIOLOGY REPORT*  Clinical Data: Unsteady gait.  Dizziness.  CT HEAD WITHOUT CONTRAST  Technique:  Contiguous axial images were obtained from the base of the skull through the vertex without contrast.  Comparison: 07/01/2004  Findings: There is no acute intracranial hemorrhage, infarction, or mass lesion.  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  The findings are stable since the prior exam.  Slight chronic dilatation of the lateral ventricles.  No osseous abnormality.  IMPRESSION: No acute abnormalities.  Extensive bilateral white matter ischemic changes including small old white matter infarcts.   Original Report Authenticated By: Francene Boyers, M.D.    Mri Brain Without Contrast  09/13/2012  *RADIOLOGY REPORT*  Clinical Data: Dizziness.   Hypertension.  MRI HEAD WITHOUT CONTRAST,MRA HEAD WITHOUT CONTRAST  Technique:  Multiplanar, multiecho pulse sequences of the brain and surrounding structures were obtained according to standard protocol without intravenous contrast.,Technique: Angiographic images of the Circle of Willis were obtained using MRA technique  Comparison: 09/12/2012 CT.  No comparison MR.  Findings: Focal area of restricted motion involving the left hippocampus / amygdala junction.  In this setting, findings most suggestive of small acute infarct rather than result of infection or seizure.  Prominent small vessel disease type changes.  Several remote scattered small infarcts.  Multiple scattered areas of blood breakdown products most consistent with result of prior hemorrhagic ischemia.  No intracranial mass lesion detected on this unenhanced exam.  Global atrophy without hydrocephalus.  Transverse ligament hypertrophy.  Cervical medullary junction, tumor, pineal region and orbital structures unremarkable.  Major intracranial vascular structures are patent with ectatic basilar artery causing superior displacement of the hypothalamus.  Minimal partial opacification inferior mastoid air cells.  IMPRESSION: Small infarct at the junction of the left hippocampus/ amygdala suspected as noted above.  This has been made a PRA call report utilizing dashboard call feature.  MRA HEAD WITHOUT CONTRAST  Technique:  Angiographic images of the Circle of Willis were obtained using MRA technique without intravenous contrast.  Findings:  Anterior circulation without medium or large size vessel significant stenosis or occlusion.  Middle cerebral artery and A2 segment anterior cerebral artery branch vessel irregularity bilaterally.  Ectatic vertebral arteries and basilar artery without high-grade stenosis. Mild narrowing and irregularity distal left vertebral artery.  Nonvisualization left PICA and right AICA.  Mild irregularity superior cerebellar artery  bilaterally.  Mild irregularity involving portions of the posterior cerebral artery bilaterally most notable distal branches.  No aneurysm noted.  IMPRESSION: Branch vessel atherosclerotic type changes as noted above.   Original Report Authenticated By: Lacy Duverney, M.D.    Mr Mra Head/brain Wo Cm  09/13/2012  *RADIOLOGY REPORT*  Clinical Data: Dizziness.  Hypertension.  MRI HEAD WITHOUT CONTRAST,MRA HEAD WITHOUT CONTRAST  Technique:  Multiplanar, multiecho pulse sequences of the brain and surrounding structures were obtained according to standard protocol without intravenous contrast.,Technique: Angiographic images of the Circle of Willis were obtained using MRA technique  Comparison: 09/12/2012 CT.  No comparison MR.  Findings: Focal area of restricted motion involving the left hippocampus / amygdala junction.  In this setting, findings most suggestive of small acute infarct rather than result of infection or seizure.  Prominent small vessel disease type changes.  Several remote scattered small infarcts.  Multiple scattered areas of blood breakdown products most consistent with result of prior hemorrhagic ischemia.  No intracranial mass lesion detected on this unenhanced exam.  Global atrophy without hydrocephalus.  Transverse ligament hypertrophy.  Cervical medullary junction, tumor, pineal region and orbital structures unremarkable.  Major intracranial vascular structures are patent with ectatic basilar  artery causing superior displacement of the hypothalamus.  Minimal partial opacification inferior mastoid air cells.  IMPRESSION: Small infarct at the junction of the left hippocampus/ amygdala suspected as noted above.  This has been made a PRA call report utilizing dashboard call feature.  MRA HEAD WITHOUT CONTRAST  Technique:  Angiographic images of the Circle of Willis were obtained using MRA technique without intravenous contrast.  Findings:  Anterior circulation without medium or large size vessel  significant stenosis or occlusion.  Middle cerebral artery and A2 segment anterior cerebral artery branch vessel irregularity bilaterally.  Ectatic vertebral arteries and basilar artery without high-grade stenosis. Mild narrowing and irregularity distal left vertebral artery.  Nonvisualization left PICA and right AICA.  Mild irregularity superior cerebellar artery bilaterally.  Mild irregularity involving portions of the posterior cerebral artery bilaterally most notable distal branches.  No aneurysm noted.  IMPRESSION: Branch vessel atherosclerotic type changes as noted above.   Original Report Authenticated By: Lacy Duverney, M.D.     Job Founds, MBA, MHA [facility] Pager [phone]  Patient seen and examined.  Clinical course and management discussed.  Necessary edits performed.  I agree with the above.  Assessment and plan of care developed and discussed below.    Assessment: 72 y.o. male with symptoms of feeling slow on presentation.  Exam with mild right sided weakness.  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  Echo and carotid dopplers have been performed and were unremarkable.  A1c and LDL are unremarkable as well.  Patient on ASA at home.    Stroke Risk Factors - hypertension  Plan: 1. Prophylactic therapy-Antiplatelet med: Plavix - dose 75mg .  ASA to be discontinued. 2. With work up complete, no further recommendations at this time.  Patient may follow up with neurology on an outpatient basis.    Case discussed with Dr. Lavera Guise   09/13/2012, 5:03 PM   Thana Farr, MD [facility] [phone]  09/13/2012  6:26 PM

## 2012-09-13 NOTE — Progress Notes (Signed)
[facility] PROGRESS NOTE  Dustin Hamilton WUJ:[id number] DOB: 1940/12/10 DOA: 09/12/2012 PCP: Dustin Mcalpine, MD  Assessment/Plan: 1. Acute stroke - small - deficits resolved - ? Etiology . Will consult neurology. Already on aspirin . PT/OT/ST 2. HTN fair control - no change in meds   Code Status: full  Family Communication: wife  Disposition Plan: home    Consultants:  Neuro   Procedures:  Lipid panel Results for Dustin Hamilton (MRN [id number]) as of 09/13/2012 14:18  Ref. Range 09/13/2012 03:30  Cholesterol Latest Range: 0-200 mg/dL 578  Triglycerides Latest Range: <150 mg/dL 469  HDL Latest Range: >39 mg/dL 25 (L)  LDL (calc) Latest Range: 0-99 mg/dL 76  VLDL Latest Range: 0-40 mg/dL 22  Total CHOL/HDL Ratio No range found 4.9  A1C 6.2      HPI/Subjective: Symptoms resolved   Objective: Filed Vitals:   09/13/12 0400 09/13/12 0800 09/13/12 1245 09/13/12 1309  BP: 130/78 164/96 165/110   Pulse: 64 69 71 73  Temp: 97.8 F (36.6 C) 98.2 F (36.8 C)  97.1 F (36.2 C)  TempSrc: Oral Oral  Oral  Resp: 18 18    Height:      Weight:      SpO2: 95% 96%  95%    Intake/Output Summary (Last 24 hours) at 09/13/12 1418 Last data filed at 09/13/12 0842  Gross per 24 hour  Intake    360 ml  Output    400 ml  Net    -40 ml   Filed Weights   09/12/12 2350  Weight: 83.3 kg (183 lb 10.3 oz)    Exam:   General:  Axox3, although he thought he ha smet me...  Cardiovascular: RRR  Respiratory: CTAB  Abdomen: soft, nt  Musculoskeletal: intact    Data Reviewed: Basic Metabolic Panel:  Recent Labs Lab 09/12/12 1725 09/13/12 0330  NA 140  --   K 3.8  --   CL 105  --   CO2 28  --   GLUCOSE 79  --   BUN 16  --   CREATININE 0.86 0.89  CALCIUM 9.7  --    Liver Function Tests:  Recent Labs Lab 09/12/12 1725  AST 71*  ALT 53  ALKPHOS 51  BILITOT 0.5  PROT 7.4  ALBUMIN 3.9   No results found for this basename: LIPASE, AMYLASE,  in the  last 168 hours No results found for this basename: AMMONIA,  in the last 168 hours CBC:  Recent Labs Lab 09/12/12 1725 09/13/12 0330  WBC 6.2 5.2  NEUTROABS 4.1  --   HGB 17.0 16.1  HCT 47.0 45.7  MCV 88.5 88.1  PLT 155 143*   Cardiac Enzymes: No results found for this basename: CKTOTAL, CKMB, CKMBINDEX, TROPONINI,  in the last 168 hours BNP (last 3 results) No results found for this basename: PROBNP,  in the last 8760 hours CBG: No results found for this basename: GLUCAP,  in the last 168 hours  No results found for this or any previous visit (from the past 240 hour(s)).   Studies: Dg Chest 2 View  09/12/2012  *RADIOLOGY REPORT*  Clinical Data: Dizziness  CHEST - 2 VIEW  Comparison: 05/03/2012  Findings: Stable elevation of the right hemidiaphragm.  Lungs are essentially clear.  No focal consolidation.  No pleural effusion or pneumothorax.  The heart is normal in size.  Degenerative changes of the visualized thoracolumbar spine.  IMPRESSION: No evidence of acute cardiopulmonary disease.   Original  Report Authenticated By: Charline Bills, M.D.    Ct Head Wo Contrast  09/12/2012  *RADIOLOGY REPORT*  Clinical Data: Unsteady gait.  Dizziness.  CT HEAD WITHOUT CONTRAST  Technique:  Contiguous axial images were obtained from the base of the skull through the vertex without contrast.  Comparison: 07/01/2004  Findings: There is no acute intracranial hemorrhage, infarction, or mass lesion.  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  The findings are stable since the prior exam.  Slight chronic dilatation of the lateral ventricles.  No osseous abnormality.  IMPRESSION: No acute abnormalities.  Extensive bilateral white matter ischemic changes including small old white matter infarcts.   Original Report Authenticated By: Francene Boyers, M.D.    Mri Brain Without Contrast  09/13/2012  *RADIOLOGY REPORT*  Clinical Data: Dizziness.   Hypertension.  MRI HEAD WITHOUT CONTRAST,MRA HEAD WITHOUT CONTRAST  Technique:  Multiplanar, multiecho pulse sequences of the brain and surrounding structures were obtained according to standard protocol without intravenous contrast.,Technique: Angiographic images of the Circle of Willis were obtained using MRA technique  Comparison: 09/12/2012 CT.  No comparison MR.  Findings: Focal area of restricted motion involving the left hippocampus / amygdala junction.  In this setting, findings most suggestive of small acute infarct rather than result of infection or seizure.  Prominent small vessel disease type changes.  Several remote scattered small infarcts.  Multiple scattered areas of blood breakdown products most consistent with result of prior hemorrhagic ischemia.  No intracranial mass lesion detected on this unenhanced exam.  Global atrophy without hydrocephalus.  Transverse ligament hypertrophy.  Cervical medullary junction, tumor, pineal region and orbital structures unremarkable.  Major intracranial vascular structures are patent with ectatic basilar artery causing superior displacement of the hypothalamus.  Minimal partial opacification inferior mastoid air cells.  IMPRESSION: Small infarct at the junction of the left hippocampus/ amygdala suspected as noted above.  This has been made a PRA call report utilizing dashboard call feature.  MRA HEAD WITHOUT CONTRAST  Technique:  Angiographic images of the Circle of Willis were obtained using MRA technique without intravenous contrast.  Findings:  Anterior circulation without medium or large size vessel significant stenosis or occlusion.  Middle cerebral artery and A2 segment anterior cerebral artery branch vessel irregularity bilaterally.  Ectatic vertebral arteries and basilar artery without high-grade stenosis. Mild narrowing and irregularity distal left vertebral artery.  Nonvisualization left PICA and right AICA.  Mild irregularity superior cerebellar artery  bilaterally.  Mild irregularity involving portions of the posterior cerebral artery bilaterally most notable distal branches.  No aneurysm noted.  IMPRESSION: Branch vessel atherosclerotic type changes as noted above.   Original Report Authenticated By: Lacy Duverney, M.D.    Mr Mra Head/brain Wo Cm  09/13/2012  *RADIOLOGY REPORT*  Clinical Data: Dizziness.  Hypertension.  MRI HEAD WITHOUT CONTRAST,MRA HEAD WITHOUT CONTRAST  Technique:  Multiplanar, multiecho pulse sequences of the brain and surrounding structures were obtained according to standard protocol without intravenous contrast.,Technique: Angiographic images of the Circle of Willis were obtained using MRA technique  Comparison: 09/12/2012 CT.  No comparison MR.  Findings: Focal area of restricted motion involving the left hippocampus / amygdala junction.  In this setting, findings most suggestive of small acute infarct rather than result of infection or seizure.  Prominent small vessel disease type changes.  Several remote scattered small infarcts.  Multiple scattered areas of blood breakdown products most consistent with result of prior hemorrhagic ischemia.  No intracranial mass lesion detected on  this unenhanced exam.  Global atrophy without hydrocephalus.  Transverse ligament hypertrophy.  Cervical medullary junction, tumor, pineal region and orbital structures unremarkable.  Major intracranial vascular structures are patent with ectatic basilar artery causing superior displacement of the hypothalamus.  Minimal partial opacification inferior mastoid air cells.  IMPRESSION: Small infarct at the junction of the left hippocampus/ amygdala suspected as noted above.  This has been made a PRA call report utilizing dashboard call feature.  MRA HEAD WITHOUT CONTRAST  Technique:  Angiographic images of the Circle of Willis were obtained using MRA technique without intravenous contrast.  Findings:  Anterior circulation without medium or large size vessel  significant stenosis or occlusion.  Middle cerebral artery and A2 segment anterior cerebral artery branch vessel irregularity bilaterally.  Ectatic vertebral arteries and basilar artery without high-grade stenosis. Mild narrowing and irregularity distal left vertebral artery.  Nonvisualization left PICA and right AICA.  Mild irregularity superior cerebellar artery bilaterally.  Mild irregularity involving portions of the posterior cerebral artery bilaterally most notable distal branches.  No aneurysm noted.  IMPRESSION: Branch vessel atherosclerotic type changes as noted above.   Original Report Authenticated By: Lacy Duverney, M.D.     Scheduled Meds: . amLODipine  10 mg Oral Daily  . aspirin EC  325 mg Oral Daily  . enoxaparin (LOVENOX) injection  40 mg Subcutaneous Q24H  . furosemide  40 mg Oral Daily  . hydrALAZINE  25 mg Oral BID  . labetalol  200 mg Oral BID  . lisinopril  20 mg Oral Daily   Continuous Infusions: . sodium chloride 50 mL/hr at 09/13/12 1191    Principal Problem:   CVA (cerebral infarction) Active Problems:   HYPERTENSION, SEVERE   HYPERTENSIVE CARDIOVASCULAR DISEASE   CEREBROVASCULAR DISEASE   Vertigo        [place] Vert  [facility] Pager [phone]. If 7PM-7AM, please contact night-coverage at [email or web address], password [facility] 09/13/2012, 2:18 PM  LOS: 1 day

## 2012-09-13 NOTE — Progress Notes (Signed)
VASCULAR LAB PRELIMINARY  PRELIMINARY  PRELIMINARY  PRELIMINARY  Carotid Dopplers completed.    Preliminary report:  There is no ICA stenosis.  Vertebral artery flow is antegrade.  Correna Meacham, RVT 09/13/2012, 10:17 AM

## 2012-09-14 NOTE — Progress Notes (Signed)
Utilization review completed.  

## 2012-09-15 ENCOUNTER — Encounter: Payer: Self-pay | Admitting: Pulmonary Disease

## 2012-09-15 ENCOUNTER — Ambulatory Visit (INDEPENDENT_AMBULATORY_CARE_PROVIDER_SITE_OTHER): Payer: Medicare Other | Admitting: Pulmonary Disease

## 2012-09-15 VITALS — BP 116/76 | HR 70 | Temp 97.5°F | Ht 71.0 in | Wt 186.8 lb

## 2012-09-15 DIAGNOSIS — I639 Cerebral infarction, unspecified: Secondary | ICD-10-CM

## 2012-09-15 DIAGNOSIS — I119 Hypertensive heart disease without heart failure: Secondary | ICD-10-CM

## 2012-09-15 DIAGNOSIS — D126 Benign neoplasm of colon, unspecified: Secondary | ICD-10-CM

## 2012-09-15 DIAGNOSIS — I679 Cerebrovascular disease, unspecified: Secondary | ICD-10-CM

## 2012-09-15 DIAGNOSIS — R945 Abnormal results of liver function studies: Secondary | ICD-10-CM

## 2012-09-15 DIAGNOSIS — I6350 Cerebral infarction due to unspecified occlusion or stenosis of unspecified cerebral artery: Secondary | ICD-10-CM

## 2012-09-15 DIAGNOSIS — E786 Lipoprotein deficiency: Secondary | ICD-10-CM

## 2012-09-15 DIAGNOSIS — R972 Elevated prostate specific antigen [PSA]: Secondary | ICD-10-CM

## 2012-09-15 DIAGNOSIS — I10 Essential (primary) hypertension: Secondary | ICD-10-CM

## 2012-09-15 MED ORDER — TADALAFIL 20 MG PO TABS: 20.0000 mg | ORAL_TABLET | Freq: Every day | ORAL | Status: DC | PRN

## 2012-09-15 NOTE — Patient Instructions (Addendum)
Today we updated your med list in our EPIC system...    Continue your current medications the same...  Take the Aspirin 81mg  & the Plavix 75mg  daily as directed...  Call for any questions...  Let's plan a follow up visit in 4-5mo, sooner if needed for problems.Marland KitchenMarland Kitchen

## 2012-09-19 ENCOUNTER — Encounter: Payer: Self-pay | Admitting: Pulmonary Disease

## 2012-09-19 NOTE — Progress Notes (Addendum)
Subjective:    Patient ID: Dustin Hamilton, male    DOB: May 15, 1941, 72 y.o.   MRN: [id number]  HPI 72 y/o BM here for a follow up visit... he has multiple medical problems as noted below...    ~  August 10, 2009:  he had a good year overall w/ BP controlled on 5 drug regimen & he is reminded to take them regularly... he had left sided back discomfort & saw TP 1/11- improved w/ Advil/ Skelaxin... he had f/u colonoscopy 11/09 w/ several adenomatous polyps removed & DrStark plans repeat in 12yrs... he continues to see DrNesi every 73mo & he does his PSA's (pt reports they are all "OK"... he adamantly refuses all vaccinations.  ~  September 05, 2009:  add-on appt for 2 week hx right sided back pain & right ear pain... notes intermittent (daily 5-6 x per day), severe (9/10), mid right back pain below scap tip... pain ppt by certain movements, occas relieved by other movements, lasts 1-2 min & resolves... "it ain't good" he says- hasn't taken any meds for this despite hx left sided back pain 1/11 (seen by TP & in ER- neg CXR, neg TSpine films, norm lab work)... **we discussed Rx w/ [id number] & ParafonTid regularly to decr freq & severity of the pain. Also c/o right ear pain x 2weeks- exam shows mild OM & Rx w/ antibiotics, & Auralgan gtts.  ~  January 29, 2011:  72mo ROV & he reports stable overall, notes some LBP & saw TP 6/12 w/ rec for rest, heat, anti-inflamm rx (Motrin vs Mobic) & he says improved... He has severe HBP on 5 med regimen that he has been taking faithfully he says (prev hx non-compliance w/ sm right occip hemorrhage 2006); BP= 136/84 today & he denies CP, palpit, syncope, SOB, edema, or cerebral ischemic symptoms, etc; he notes occas dizzy- intermittent, self limited, discussed rx w/ Bonine OTC;  He has hx low-HDL chol on diet management alone; note prev LFTs elev w/ hx chr persist hepatitis- he knows to avoid all hepatotoxins; note f/u colon due 11/12 per DrStark; he has elev PSA & followed by  [facility] w/ appt due next week...  He will ret for fasting blood work >>  ~  May 03, 2012:  72mo ROV & Diego has had a good year- no new complaints or concerns...  We addressed these problems during today's visit:    HBP, HCVD, abnEKG> on Labet200Bid, Amlod10, Lisin20, Lasix40, Apres25Bid; BP= 120/82 & pt is taking his meds regularly w/ good control of BP; denies CP, palpit, HA, dizzy, SOB, edema... EKG w/ ant scar, sl IVCD, NAD (Baseline is just NSSTTWA)... f/u 2DEcho to assess ant wall motion=> mild LVH, norm LVF w/ EF=60-65%, no regional wall motion abnormalities...    Cerebrovasc dis> on ASA325; he denies visual sx, HAs, dizzy, syncope, weakness, etc...    Low HDL> on diet alone; FLP shows TChol 127, TG 110, HDL 29, LDL 76; advised to incr exercise program...    GI- polyps, hems, RIH> hx mult adenomatous polyps; last colon 11/13 showed one polyp & int hems- Bx= tubular adenoma & f/u requested in 5 yrs...    Abn LFT's- prob FLD> eval by DrWeissman~1980 & DrStark 2004 (see below); last check 9/12- reviewed & repeat serology was neg x HepB Ab (prior exposure & immunity); Sonar was essent wnl as well; Labs reveal SGOT=70, SGPT=80.    GU- BPH, elevPSA, Hematuria, Renal cyst> followed by Thermon Leyland & PSA= 5.80  LBP> on Motrin as needed; known spondylitic changes on XRays... We reviewed prob list, meds, xrays and labs> see below for updates >> he has a hx of non-compliance w/ ed Rx & advised to take all meds as directed every day... CXR 12/13 showed norm heart size, tortuous Ao, elev right hemidiaph, no focal airsp dis, basilar atx, NAD.Marland KitchenMarland Kitchen EKG 12/13 showed NSR, rate61, poor R prog V1-3... 2DEcho 12/13 showed mild LVH, normal LVF w/ EF=60-65%, norm wall motion & no regional wall motion abn; Gr1DD... LABS 12/13:  FLP- at goals on diet alone;  Chems- ok x incr LFTs w/ GOT=70 GPT=80;  CBC- wnl;  TSH=1.07;  PSA=5.80 (copy to [facility]).Marland Kitchen.  ~  September 15, 2012:  72mo ROV & post hosp check> he was [facility] by [facility]  4/13 - 09/13/12 w/ transient confusion, emotional lability, & vertigo; denied HA/ n/v f/c/s etc; MRI revealed a sm ischemic stroke & Neuro consult rec adding Plavix to his ASA; all symptoms resolved back to baseline & he is reassured... We reviewed the following medical problems during today's office visit >>     HBP, HCVD, abnEKG> on Labet200Bid, Amlod10, Lisin20, Lasix40, Apres25Bid; BP= 116/76 & pt is taking his meds regularly w/ good control of BP; denies CP, palpit, HA, dizzy, SOB, edema... EKG w/ ant scar, sl IVCD, NAD (Baseline is just NSSTTWA)... f/u 2DEcho to assess ant wall motion=> mild LVH, norm LVF w/ EF=60-65%, no regional wall motion abnormalities...    Cerebrovasc dis> on ASA81 + Plavix75 now; Adm 4/14 by [facility] as above; he denies visual sx, HAs, dizzy, syncope, weakness, etc...    Low HDL> on diet alone; FLP 4/14 shows TChol 123, TG 112, HDL 25, LDL 76; advised to incr exercise program...    GI- polyps, hems, RIH> hx mult adenomatous polyps; last colon 11/13 showed one polyp & int hems- Bx= tubular adenoma & f/u requested in 5 yrs...    Abn LFT's- prob FLD> eval by DrWeissman~1980 & DrStark 2004 (see below); last check 9/12- reviewed & repeat serology was neg x HepB Ab (prior exposure & immunity); Sonar was essent wnl as well; GI declined to see him for this prob & [facility]; Labs reveal SGOT=71, SGPT=53 (improved)...    GU- BPH, elevPSA, Hematuria, Renal cyst> followed by DrNesi & last seen 12/13 w/ PSA= 5.80    LBP> on Motrin as needed; known spondylitic changes on XRays...          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 ~  CXR 1/11 showed elev right hemidiaph w/ scarring, NAD.Marland Kitchen. ~  CXR 12/13 showed norm heart size, tortuous Ao, elev right hemidiaph, no focal airsp dis, basilar atx, NAD.Marland Kitchen. 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  2.  HYPERTENSION, SEVERE (ICD-401.0) - long hx severe HBP and noncompliance w/ med Rx;  resulted in hospitalization w/ small ICH 1/06;  supposed to be on a 5 drug regimen:  LABETOLOL 200mg - 2tabsBid,  HYDRALAZINE 25mg Bid,  AMLODIPINE 10mg /d,  COZAAR 100mg /d,  LASIX 40mg /d...   ~  8/12:  BP= 136/84, & similar at home- taking meds regularly he says, & tolerating well... denies HA, fatigue, visual changes, CP, palipit, syncope, dyspnea, edema, etc...  ~  12/13:  on Labet200Bid, Amlod10, Lisin20, Lasix40, Apres25Bid; BP= 120/82 & pt is taking his meds regularly w/ good control of BP; denies CP, palpit, HA, dizzy, SOB, edema... EKG  w/ ant scar, sl IVCD, NAD (Baseline is just NSSTTWA)... f/u 2DEcho to assess ant wall motion=> mild LVH, norm LVF w/ EF=60-65%, no regional wall motion abnormalities...  3. 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 ~  2D Echo 1/06 showed mod incr LV wall thickness... ~  repeat 2DEcho 12/08 showed norm LV size & function w/ EF=65% but mild incr LV wall thickness, no regional wall motion abn... ~  EKG 12/13 showed NSR, rate61, 1st degree ABV, poor R prog/ ant scar, mild IVCD, no acute STTWA... ~  2DEcho 12/13 & 4/14 ==>  Mild to mod LVH, norm LVF w/ EF=60-65%, no regional wall motion abnormalities...  4. PERS HX NONCOMPLIANCE W/MED TX PRS HAZARDS HLTH (ICD-V15.81) - BP's have been adeq controlled when he takes his meds regularly... we discuss medication compliance at each & every OV.  5. CEREBROVASCULAR DISEASE (ICD-437.9) >> prev on ASA325 alone; he was Adm w/ sm stroke 4/14 & Neuro rec ASA81 + PLAVIX75... 6. 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 ~  CT Head & MRI Brain 4/14 showed sm acute infarct at the left hippocampus/ amygdala junction, extensive bilat white matter  ischemic changes w/ small old white matter infarcts, global atrophy; and branch vessel atherosclerotic changes w/ ectatic vertebrals & basilar art on MRA.... ~  CDopplers 4/14 showed bilat min plaque, no signif ICA stenoses, vertebrals antegrade...  7. 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 he is counselled on a low chol/ low fat diet & exercise program to help his HDL... ~  FLP 11/08 showed TChol 133, TG 103, HDL 25, LDL 87 ~  FLP 11/09 showed TChol 113, TG 100, HDL 24, LDL 69... rec> incr exercise. ~  FLP 9/12 ==> pending  8. LIVER FUNCTION TESTS, ABNORMAL (ICD-794.8) - Hx prev GI evals:  Remote hx Etoh & drug abuse;  Hx HepA 1960's; GI eval 1979 DrWeissman w/ + HepBSAg & liver bx showing Chr Persist Hepatitis;  GI eval DrStark 2004 w/ prob fatty infiltration of liver;  LFT's ~ 70/70 11/08; ~  labs 10/09 showed SGOT= 52, SGPT= 54... rec> no hepatotoxins, diet, keep wt down. ~  labs 1/11 showed SGOT= 44, SGPT= 49 ~  Labs 9/12 ==> pending ~  GI declined to see him for this problem & he was referred to the [facility] for liver assessment (note pending)...  9. COLONIC POLYPS (ICD-211.3) 10. HEMORRHOIDS (ICD-455.6) 11. INGUINAL HERNIA, RIGHT (ICD-550.90) >> repaired by drGross, CCS in Jan09... ~  colonoscopy 7/07 by DrStark w/ mult polyps 4-9mm size (adenomatous)& hems;  f/u 3 yrs. ~  colonoscopy 11/09 by DrStark showed mult polyps 4-20mm size (adenomatous on bx), f/u 20yrs. ~  Colonoscopy 11/13 showed one polyp & int hems- Bx= tubular adenoma & f/u requested in 5 yrs...  12. PSA, INCREASED (ICD-790.93) - eval by DrNesi for Urology & pt reports that he is checked every 64mo... 13. BENIGN PROSTATIC HYPERTROPHY, HX OF (ICD-V13.8) 14. Hx of HEMATURIA UNSPECIFIED (ICD-599.70) - s/p neg cystoscopy 10/01... 15. RENAL CYST (ICD-593.2) ~  labs 6/07 showed PSA= 6.13,   ~  labs 11/08 showed PSA= 5.11 ~  12/13: [facility] from [facility]- BPH w/ BOO, ED, and elevPSA- on  cialis20 prn, PSA was as hi as 12.58 in 2012 but dropped to  5.99 & DrNesi is checking him Q11mo...  16. 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 BACK PAIN (ICD-724.5) - hx fluctuating left/ right sides back pains... TSpine films 1/11 w/ spondylitic changes, athropathy, etc... He takes MOTRIN 800mg  Prn...   Past Surgical History  Procedure Laterality Date  . Cystoscopy and retrograde pyleogram    . Hernia repair  1965 & 1980's &2008    bilateral  . Liver biopsy  1980's    Outpatient Encounter Prescriptions as of 09/15/2012  Medication Sig Dispense Refill  . amLODipine (NORVASC) 10 MG tablet Take 1 tablet (10 mg total) by mouth daily.  90 tablet  3  . furosemide (LASIX) 40 MG tablet Take 1 tablet (40 mg total) by mouth daily.  90 tablet  3  . hydrALAZINE (APRESOLINE) 25 MG tablet Take 1 tablet (25 mg total) by mouth 2 (two) times daily.  180 tablet  3  . labetalol (NORMODYNE) 200 MG tablet Take 1 tablet (200 mg total) by mouth 2 (two) times daily.  180 tablet  3  . lisinopril (PRINIVIL,ZESTRIL) 20 MG tablet Take 1 tablet (20 mg total) by mouth daily.  90 tablet  3  . tadalafil (CIALIS) 20 MG tablet Take 1 tablet (20 mg total) by mouth daily as needed.  10 tablet  11  . [DISCONTINUED] tadalafil (CIALIS) 20 MG tablet Take 1 tablet (20 mg total) by mouth daily as needed.  10 tablet  11  . aspirin 81 MG tablet Take 1 tablet (81 mg total) by mouth daily.      . clopidogrel (PLAVIX) 75 MG tablet Take 1 tablet (75 mg total) by mouth daily with breakfast.  30 tablet  0   No facility-administered encounter medications on file as of 09/15/2012.    Allergies  Allergen Reactions  . Penicillins     REACTION: hives    Current Medications, Allergies, Past Medical History, Past Surgical History, Family History, and Social History were reviewed in [employer] record.     Review of Systems         See HPI - all other systems neg except as noted...  The  patient complains of decreased hearing, dyspnea on exertion, and difficulty walking.  The patient denies anorexia, fever, weight loss, weight gain, vision loss, hoarseness, chest pain, syncope, peripheral edema, prolonged cough, headaches, hemoptysis, abdominal pain, melena, hematochezia, severe indigestion/heartburn, hematuria, incontinence, muscle weakness, suspicious skin lesions, transient blindness, depression, unusual weight change, abnormal bleeding, enlarged lymph nodes, and angioedema.   Objective:   Physical Exam      WD, WN, 72 y/o BM in NAD... GENERAL:  Alert & oriented; pleasant & cooperative. HEENT:  Merna/AT, EOM-wnl, PERRLA, EACs-clear, TMs- red sl bulge on right, NOSE-clear, THROAT-clear & wnl. NECK:  Supple w/ fairROM; no JVD; normal carotid impulses w/o bruits; no thyromegaly or nodules palpated; no lymphadenopathy. CHEST:  Clear to P & A; without wheezes/ rales/ or rhonchi. HEART:  Regular Rhythm; without murmurs/ rubs/ or gallops. ABDOMEN:  Soft & nontender; normal bowel sounds; no organomegaly or masses detected. EXT: without deformities or arthritic changes; no varicose veins/ venous insuffic/ or edema... BACK:  no lesions seen, non-tender to palp or motion of arm/ shoulder... NEURO:  CN's intact; motor testing normal; sensory testing normal; gait normal & balance OK. DERM:  round patch on right lat thigh area- ?ring worm ?dermatitis- try rx w/ Lotrisone cream.  RADIOLOGY DATA:  Reviewed in the EPIC EMR & discussed w/ the patient.Marland KitchenMarland Kitchen  LABORATORY DATA:  Reviewed in the EPIC EMR & discussed w/ the patient...   Assessment & Plan:    HBP & HCVD>  Hx severe HBP on 5 drug regimen & he is reminded to take all meds every day & check BP at home to be sure control is adeq; continue same.  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  LOW-HDL Chol>  Oddly his TChol looks great & LDL is ok on diet alone; he is encouraged to incr exercise, etc...  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  Colon Polyps>  Due for f/u colon w/ DrStark 11/12 & we will refer...  Elev PSA>  Followed by Thermon Leyland every 78mo & he has appt next week (we don't have recent notes).  Hx Back Pain>  We refilled his Motrin 800mg  per request...   Patient's Medications  New Prescriptions   No medications on file  Previous Medications   AMLODIPINE (NORVASC) 10 MG TABLET    Take 1 tablet (10 mg total) by mouth daily.   ASPIRIN 81 MG TABLET    Take 1 tablet (81 mg total) by mouth daily.   CLOPIDOGREL (PLAVIX) 75 MG TABLET    Take 1 tablet (75 mg total) by mouth daily with breakfast.   FUROSEMIDE (LASIX) 40 MG TABLET    Take 1 tablet (40 mg total) by mouth daily.   HYDRALAZINE (APRESOLINE) 25 MG TABLET    Take 1 tablet (25 mg total) by mouth 2 (two) times daily.   LABETALOL (NORMODYNE) 200 MG TABLET    Take 1 tablet (200 mg total) by mouth 2 (two) times daily.   LISINOPRIL (PRINIVIL,ZESTRIL) 20 MG TABLET    Take 1 tablet (20 mg total) by mouth daily.  Modified Medications   Modified Medication Previous Medication   TADALAFIL (CIALIS) 20 MG TABLET tadalafil (CIALIS) 20 MG tablet      Take 1 tablet (20 mg total) by mouth daily as needed.    Take 1 tablet (20 mg total) by mouth daily as needed.  Discontinued Medications   No medications on file

## 2012-11-03 ENCOUNTER — Other Ambulatory Visit: Payer: Self-pay | Admitting: Pulmonary Disease

## 2012-11-03 MED ORDER — CLOPIDOGREL BISULFATE 75 MG PO TABS: 75.0000 mg | ORAL_TABLET | Freq: Every day | ORAL | Status: DC

## 2012-11-22 ENCOUNTER — Telehealth: Payer: Self-pay | Admitting: Pulmonary Disease

## 2012-11-22 MED ORDER — MECLIZINE HCL 25 MG PO TABS: 25.0000 mg | ORAL_TABLET | Freq: Four times a day (QID) | ORAL | Status: DC | PRN

## 2012-11-22 NOTE — Telephone Encounter (Signed)
Called spoke with pt's spouse.  She stated that earlier this morning had been outside working and came in c/o dizziness.  Episode lasted approx .  Per wife, pt not drinking fluids.  Denied sudden postiional changes.  Wife stated she is going to take pt to the drug store to have his BP checked.  Advised wife that pt needs to be drinking plenty of fluids, esp with the heat/humidity.  Advised that dehydration can cause dizziness and a low BP reading.  Wife to call back if pt's BP is low.  Dr Kriste Basque please advise of any additional recommendations.  Thank you. Last ov 4.16.14, follow up 4-6 months >> 9.17.14

## 2012-11-22 NOTE — Telephone Encounter (Signed)
Called, spoke with pt's wife.   Informed her of below per SN's recs.   She verbalized understanding and is aware rx sent to [facility]. She will call back if symptoms do not improve or worsen and have pt seek emergency care if needed.

## 2012-11-22 NOTE — Telephone Encounter (Signed)
Per SN--  Stay in ---out of the heat Drink plenty of water antivert  25 mg  #30  1 po every 6 hours as needed for dizziness.

## 2012-11-22 NOTE — Telephone Encounter (Signed)
Called and spoke with pt and he stated that he is having some pain in his back.  He stated that he has some muscle spasm pills---parfon forte 500 mg.  Pt wanted to make sure that this was ok to take these pills with SN before he took them.    I advised the pt that SN prescribed this medication and he should only take these as prescribed.  Also advised pt to try and use a heating pad to the area with pain.  Pt was advised to call back if this does not help his pain and we can get him in to be seen.  Pt voiced his understanding and nothing further is needed.

## 2012-11-26 ENCOUNTER — Ambulatory Visit: Payer: Medicare Other | Admitting: Diagnostic Neuroimaging

## 2012-12-10 ENCOUNTER — Ambulatory Visit: Payer: Medicare Other | Admitting: Diagnostic Neuroimaging

## 2012-12-27 ENCOUNTER — Encounter: Payer: Self-pay | Admitting: Diagnostic Neuroimaging

## 2012-12-27 ENCOUNTER — Ambulatory Visit (INDEPENDENT_AMBULATORY_CARE_PROVIDER_SITE_OTHER): Payer: Medicare Other | Admitting: Diagnostic Neuroimaging

## 2012-12-27 VITALS — BP 157/101 | HR 67 | Temp 97.9°F | Ht 71.0 in | Wt 184.0 lb

## 2012-12-27 DIAGNOSIS — I639 Cerebral infarction, unspecified: Secondary | ICD-10-CM | POA: Insufficient documentation

## 2012-12-27 DIAGNOSIS — I6330 Cerebral infarction due to thrombosis of unspecified cerebral artery: Secondary | ICD-10-CM

## 2012-12-27 NOTE — Progress Notes (Signed)
[facility]  PATIENT: Dustin Hamilton DOB: 09/27/1940  REFERRING CLINICIAN: Nnodi HISTORY FROM: patient and wife REASON FOR VISIT: new consult   HISTORICAL  CHIEF COMPLAINT:  Chief Complaint  Patient presents with  . 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 Patient was admitted with confusion episode diagnosed with ischemic infarction. He had a stroke workup and was discharged on aspirin and Plavix. Patient was previously on aspirin, and was recommended to switch to Plavix by neurology. However he was switched to aspirin plus Plavix and has continued on this since that time.   Since that time he has done well. No new neurologic events. Patient continues with chronic short-term memory problems, dizziness, blurred vision.  REVIEW OF SYSTEMS: Full 14 system review of systems performed and notable only for snoring memory loss dizziness blurred vision.  ALLERGIES: Allergies  Allergen Reactions  . Penicillins     REACTION: hives    HOME MEDICATIONS: Outpatient Prescriptions Prior to Visit  Medication Sig Dispense Refill  . amLODipine (NORVASC) 10 MG tablet Take 1 tablet (10 mg total) by mouth daily.  90 tablet  3  . clopidogrel (PLAVIX) 75 MG tablet Take 1 tablet (75 mg total) by mouth daily with breakfast.  30 tablet  11  . furosemide (LASIX) 40 MG tablet Take 1 tablet (40 mg total) by mouth daily.  90 tablet  3  . hydrALAZINE (APRESOLINE) 25 MG tablet Take 1 tablet (25 mg total) by mouth 2 (two) times daily.  180 tablet  3  . labetalol (NORMODYNE) 200 MG tablet Take 1 tablet (200 mg total) by mouth 2 (two) times daily.  180 tablet  3  . lisinopril (PRINIVIL,ZESTRIL) 20 MG tablet Take 1 tablet (20 mg total) by mouth daily.  90 tablet  3  . meclizine (ANTIVERT) 25 MG tablet Take 1  tablet (25 mg total) by mouth every 6 (six) hours as needed for dizziness.  30 tablet  0  . aspirin 81 MG tablet Take 1 tablet (81 mg total) by mouth daily.      . tadalafil (CIALIS) 20 MG tablet Take 1 tablet (20 mg total) by mouth daily as needed.  10 tablet  11   No facility-administered medications prior to visit.    PAST MEDICAL HISTORY: Past Medical History  Diagnosis Date  . Congenital anomaly of diaphragm   . HTN (hypertension)   . 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 Personal history of noncompliance with medical treatment, presenting hazards to health   . Cerebrovascular disease   . Unspecified intracranial hemorrhage   . Low HDL (under 40)   . Abnormal LFTs   . Adenomatous colon polyp 11/2005    TA polyp 04/2012  . Hemorrhoid   . Right inguinal hernia   . BPH (benign prostatic hyperplasia)   . Increased prostate specific antigen (PSA) velocity   . Hematuria   . Renal cyst   . Back pain   . Nondependent alcohol abuse, in remission     PAST SURGICAL HISTORY: Past Surgical History  Procedure Laterality Date  . Cystoscopy and retrograde pyleogram    . Hernia repair  1965 & 1980's &2008    bilateral  . Liver biopsy  1980's    FAMILY HISTORY: Family History  Problem Relation Age of Onset  . Heart  disease Maternal Grandmother   . Rheum arthritis Maternal Grandmother   . Diabetes Maternal Grandmother   . Stroke Maternal Grandmother   . Rheum arthritis Mother   . Diabetes Mother   . Stroke Mother   . Heart attack Mother   . Colon cancer Neg Hx   . Heart attack Father     SOCIAL HISTORY:  History   Social History  . Marital Status: Married    Spouse Name: [name]    Number of Children: 1  . Years of Education: College   Occupational History  . photographer    Social History Main Topics  . Smoking status: Former Smoker -- 1.00 packs/day for 4 years    Types: Cigarettes    Quit date: 11/30/1981  . Smokeless tobacco: Never  Used  . Alcohol Use: No     Comment: quit: Oct 1963  . Drug Use: No     Comment: quit: July 1983  . Sexually Active: Not on file   Other Topics Concern  . Not on file   Social History Narrative   Patient lives at home with his spouse.   Caffeine Use: 6-7 sodas daily      PHYSICAL EXAM  Filed Vitals:   12/27/12 0926  BP: 157/101  Pulse: 67  Temp: 97.9 F (36.6 C)  TempSrc: Oral  Height: 5\' 11"  (1.803 m)  Weight: 184 lb (83.462 kg)    Not recorded    Body mass index is 25.67 kg/(m^2).  GENERAL EXAM: Patient is in no distress; BORDERLINE MYERSONS, SNOUT REFLEXES.  CARDIOVASCULAR: Regular rate and rhythm, no murmurs, no carotid bruits  NEUROLOGIC: MENTAL STATUS: awake, alert, language fluent, comprehension intact, naming intact CRANIAL NERVE: no papilledema on fundoscopic exam, pupils equal and reactive to light, visual fields: RIGHT SUPERIOR QUADRANTANOPSIA. extraocular muscles intact, no nystagmus, facial sensation and strength symmetric, uvula midline, shoulder shrug symmetric, tongue midline. MOTOR: normal bulk and tone, full strength in the BUE, BLE SENSORY: normal and symmetric to light touch, pinprick, temperature, vibration COORDINATION: finger-nose-finger, fine finger movements normal REFLEXES: deep tendon reflexes present and symmetric GAIT/STATION: narrow based gait; romberg is negative   DIAGNOSTIC DATA (LABS, IMAGING, TESTING) - I reviewed patient records, labs, notes, testing and imaging myself where available.  Lab Results  Component Value Date   WBC 5.2 09/13/2012   HGB 16.1 09/13/2012   HCT 45.7 09/13/2012   MCV 88.1 09/13/2012   PLT 143* 09/13/2012      Component Value Date/Time   NA 140 09/12/2012 1725   K 3.8 09/12/2012 1725   CL 105 09/12/2012 1725   CO2 28 09/12/2012 1725   GLUCOSE 79 09/12/2012 1725   BUN 16 09/12/2012 1725   CREATININE 0.89 09/13/2012 0330   CALCIUM 9.7 09/12/2012 1725   PROT 7.4 09/12/2012 1725   ALBUMIN 3.9 09/12/2012 1725    AST 71* 09/12/2012 1725   ALT 53 09/12/2012 1725   ALKPHOS 51 09/12/2012 1725   BILITOT 0.5 09/12/2012 1725   GFRNONAA 84* 09/13/2012 0330   GFRAA >90 09/13/2012 0330   Lab Results  Component Value Date   CHOL 123 09/13/2012   HDL 25* 09/13/2012   LDLCALC 76 09/13/2012   TRIG 112 09/13/2012   CHOLHDL 4.9 09/13/2012   Lab Results  Component Value Date   HGBA1C 6.2* 09/13/2012   No results found for this basename: [id number]   Lab Results  Component Value Date   TSH 1.07 05/05/2012     09/13/12 TTE - Left  ventricle: The cavity size was normal. Wall thickness was increased in a pattern of moderate LVH. Systolic function was normal. The estimated ejection fraction was in the range of 60% to 65%. Wall motion was normal; there were no regional wall motion abnormalities.  09/13/12 Carotid u/s - Bilateral: minimal plaque noted. No ICA stenosis. Vertebral artery flow is antegrade. ICA/CCA ratio: R-0.56 L-0.58  09/13/12 MRI brain - small infarct at the junction of the left hippocampus/ amygdala  suspected as noted; moderate chronic small vessel ischemic disease, moderate atrophy, multiple chronic cerebral microbleeds.  09/13/12 MRA head - branch vessel atherosclerotic type changes    ASSESSMENT AND PLAN  72 y.o. 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 here with hypertension, intracerebral hemorrhage, and left temporal ischemic infarction in April 2014. Recommend  patient continue only on Plavix and discontinue aspirin. Patient should followup PCP regarding blood pressure control.   PLAN: 1. STOP ASPIRIN 2. Continue plavix 3. BP control    Suanne Marker, MD 12/27/2012, 9:56 AM Certified in Neurology, Neurophysiology and Neuroimaging  [facility] [address] [place], [place] [address] [phone]

## 2012-12-27 NOTE — Patient Instructions (Signed)
Stop aspirin.  Continue plavix.  Drink water.  15-20 minutes exercise per day.

## 2013-02-16 ENCOUNTER — Ambulatory Visit: Payer: Medicare Other | Admitting: Pulmonary Disease

## 2013-02-23 ENCOUNTER — Ambulatory Visit (INDEPENDENT_AMBULATORY_CARE_PROVIDER_SITE_OTHER): Payer: Medicare Other | Admitting: Pulmonary Disease

## 2013-02-23 ENCOUNTER — Encounter: Payer: Self-pay | Admitting: Pulmonary Disease

## 2013-02-23 VITALS — BP 130/88 | HR 62 | Temp 98.1°F | Ht 71.0 in | Wt 190.4 lb

## 2013-02-23 DIAGNOSIS — I119 Hypertensive heart disease without heart failure: Secondary | ICD-10-CM

## 2013-02-23 DIAGNOSIS — I639 Cerebral infarction, unspecified: Secondary | ICD-10-CM

## 2013-02-23 DIAGNOSIS — I10 Essential (primary) hypertension: Secondary | ICD-10-CM

## 2013-02-23 DIAGNOSIS — E786 Lipoprotein deficiency: Secondary | ICD-10-CM

## 2013-02-23 DIAGNOSIS — Z87898 Personal history of other specified conditions: Secondary | ICD-10-CM

## 2013-02-23 DIAGNOSIS — R945 Abnormal results of liver function studies: Secondary | ICD-10-CM

## 2013-02-23 DIAGNOSIS — R972 Elevated prostate specific antigen [PSA]: Secondary | ICD-10-CM

## 2013-02-23 DIAGNOSIS — D126 Benign neoplasm of colon, unspecified: Secondary | ICD-10-CM

## 2013-02-23 DIAGNOSIS — I6330 Cerebral infarction due to thrombosis of unspecified cerebral artery: Secondary | ICD-10-CM

## 2013-02-23 DIAGNOSIS — I679 Cerebrovascular disease, unspecified: Secondary | ICD-10-CM

## 2013-02-23 NOTE — Patient Instructions (Addendum)
Today we updated your med list in our EPIC system...    Continue your current medications the same...  [facility]...     Let us know of any med changes...    Bring [place] copies of any labs that they do...  Call for any questions...  Let's plan a follow up visit in 59mo, sooner if needed for problems.Marland KitchenMarland Kitchen

## 2013-02-23 NOTE — Progress Notes (Signed)
Subjective:    Patient ID: Dustin Hamilton, male    DOB: [birth year], 72 y.o.   MRN: [id number]  HPI 72 y/o BM here for a follow up visit... he has multiple medical problems as noted below...    ~  January 29, 2011:  9mo ROV & he reports stable overall, notes some LBP & saw TP 6/12 w/ rec for rest, heat, anti-inflamm rx (Motrin vs Mobic) & he says improved... He has severe HBP on 5 med regimen that he has been taking faithfully he says (prev hx non-compliance w/ sm right occip hemorrhage 2006); BP= 136/84 today & he denies CP, palpit, syncope, SOB, edema, or cerebral ischemic symptoms, etc; he notes occas dizzy- intermittent, self limited, discussed rx w/ Bonine OTC;  He has hx low-HDL chol on diet management alone; note prev LFTs elev w/ hx chr persist hepatitis- he knows to avoid all hepatotoxins; note f/u colon due 11/12 per DrStark; he has elev PSA & followed by [facility] w/ appt due next week...  He will ret for fasting blood work >>  ~  May 03, 2012:  52mo ROV & Rayman has had a good year- no new complaints or concerns...  We addressed these problems during today's visit:    HBP, HCVD, abnEKG> on Labet200Bid, Amlod10, Lisin20, Lasix40, Apres25Bid; BP= 120/82 & pt is taking his meds regularly w/ good control of BP; denies CP, palpit, HA, dizzy, SOB, edema... EKG w/ ant scar, sl IVCD, NAD (Baseline is just NSSTTWA)... f/u 2DEcho to assess ant wall motion=> mild LVH, norm LVF w/ EF=60-65%, no regional wall motion abnormalities...    Cerebrovasc dis> on ASA325; he denies visual sx, HAs, dizzy, syncope, weakness, etc...    Low HDL> on diet alone; FLP shows TChol 127, TG 110, HDL 29, LDL 76; advised to incr exercise program...    GI- polyps, hems, RIH> hx mult adenomatous polyps; last colon 11/13 showed one polyp & int hems- Bx= tubular adenoma & f/u requested in 5 yrs...    Abn LFT's- prob FLD> eval by DrWeissman~1980 & DrStark 2004 (see below); last check 9/12- reviewed & repeat serology was neg  x HepB Ab (prior exposure & immunity); Sonar was essent wnl as well; Labs reveal SGOT=70, SGPT=80.    GU- BPH, elevPSA, Hematuria, Renal cyst> followed by Thermon Leyland & PSA= 5.80    LBP> on Motrin as needed; known spondylitic changes on XRays... We reviewed prob list, meds, xrays and labs> see below for updates >> he has a hx of non-compliance w/ ed Rx & advised to take all meds as directed every day... CXR 12/13 showed norm heart size, tortuous Ao, elev right hemidiaph, no focal airsp dis, basilar atx, NAD.Marland KitchenMarland Kitchen EKG 12/13 showed NSR, rate61, poor R prog V1-3... 2DEcho 12/13 showed mild LVH, normal LVF w/ EF=60-65%, norm wall motion & no regional wall motion abn; Gr1DD... LABS 12/13:  FLP- at goals on diet alone;  Chems- ok x incr LFTs w/ GOT=70 GPT=80;  CBC- wnl;  TSH=1.07;  PSA=5.80 (copy to [facility]).Marland Kitchen.  ~  September 15, 2012:  58mo ROV & post hosp check> he was [facility] by [facility] 4/13 - 09/13/12 w/ transient confusion, emotional lability, & vertigo; denied HA/ n/v f/c/s etc; MRI revealed a sm ischemic stroke & Neuro consult rec adding Plavix to his ASA; all symptoms resolved back to baseline & he is reassured... We reviewed the following medical problems during today's office visit >>     HBP, HCVD, abnEKG> on Labet200Bid, Amlod10, Lisin20, Lasix40,  Apres25Bid; BP= 116/76 & pt is taking his meds regularly w/ good control of BP; denies CP, palpit, HA, dizzy, SOB, edema... EKG w/ ant scar, sl IVCD, NAD (Baseline is just NSSTTWA)... f/u 2DEcho to assess ant wall motion=> mild LVH, norm LVF w/ EF=60-65%, no regional wall motion abnormalities...    Cerebrovasc dis> on ASA81 + Plavix75 now; Adm 4/14 by Triad as above; he denies visual sx, HAs, dizzy, syncope, weakness, etc...    Low HDL> on diet alone; FLP 4/14 shows TChol 123, TG 112, HDL 25, LDL 76; advised to incr exercise program...    GI- polyps, hems, RIH> hx mult adenomatous polyps; last colon 11/13 showed one polyp & int hems- Bx= tubular adenoma & f/u requested in 5  yrs...    Abn LFT's- prob FLD> eval by DrWeissman~1980 & DrStark 2004 (see below); last check 9/12- reviewed & repeat serology was neg x HepB Ab (prior exposure & immunity); Sonar was essent wnl as well; GI declined to see him for this prob & [facility]; Labs reveal SGOT=71, SGPT=53 (improved)...    GU- BPH, elevPSA, Hematuria, Renal cyst> followed by DrNesi & last seen 12/13 w/ PSA= 5.80    LBP> on Motrin as needed; known spondylitic changes on XRays... We reviewed the following medical problems during today's office visit >>  LABS 4/14:  FLP- at goals x low HDL=25;  Chems- ok & A1c=6.2;  CBC- wnl...  ~  February 23, 2013:  5-20mo ROV & Jawann saw DrPenumalli for Neuro 7/14- he stopped his ASA & treated w/ Plavix alone;  He also tells me he is going to the [facility] & they have done labs and changed some meds- he is requested to get labs for our records and bring all meds to every visit...  On Labet200Bid, Amlod10, Lisin20, Apres25Bid, and Lasix40> BP= 130/88 and we discussed importance of daily dosing & compliance... I am concerned about the [place] changing his meds, advised careful f/u of BP...  He remains on Plavix> seen by [facility] 7/14 & ASA stopped at that time, he denies cerebral ischemic symptoms... It is unclear if he is taking Proscar5 and/or Tamsulosin0.4 per the [facility]> asked to bring all med bottles to every offiuce visit to review...  We reviewed prob list, meds, xrays and labs> see below for updates >> he refuses the 2014 Flu vaccine...           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 ~  CXR 1/11 showed elev right hemidiaph w/ scarring, NAD.Marland Kitchen. ~  CXR 12/13 showed norm heart size, tortuous Ao, elev right hemidiaph, no focal airsp dis, basilar atx, NAD.Marland Kitchen. 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  2. HYPERTENSION, SEVERE (ICD-401.0) - long hx severe HBP  and noncompliance w/ med Rx;  resulted in hospitalization w/ small ICH 1/06;  supposed to be on a 5 drug regimen:  LABETOLOL 200mg - 2tabsBid,  HYDRALAZINE 25mg Bid,  AMLODIPINE 10mg /d,  COZAAR 100mg /d,  LASIX 40mg /d...   ~  8/12:  BP= 136/84, & similar at home- taking meds regularly he says, & tolerating well... denies HA, fatigue, visual changes, CP, palipit, syncope, dyspnea, edema, etc...  ~  12/13:  on Labet200Bid, Amlod10, Lisin20, Lasix40, Apres25Bid; BP= 120/82 & pt is taking his meds regularly w/ good control of BP; denies CP, palpit, HA, dizzy, SOB, edema... EKG w/ ant scar, sl IVCD, NAD (Baseline is  just NSSTTWA)... f/u 2DEcho to assess ant wall motion=> mild LVH, norm LVF w/ EF=60-65%, no regional wall motion abnormalities...  3. 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 ~  2D Echo 1/06 showed mod incr LV wall thickness... ~  repeat 2DEcho 12/08 showed norm LV size & function w/ EF=65% but mild incr LV wall thickness, no regional wall motion abn... ~  EKG 12/13 showed NSR, rate61, 1st degree ABV, poor R prog/ ant scar, mild IVCD, no acute STTWA... ~  2DEcho 12/13 & 4/14 ==>  Mild to mod LVH, norm LVF w/ EF=60-65%, no regional wall motion abnormalities...  4. PERS HX NONCOMPLIANCE W/MED TX PRS HAZARDS HLTH (ICD-V15.81) - BP's have been adeq controlled when he takes his meds regularly... we discuss medication compliance at each & every OV.  5. CEREBROVASCULAR DISEASE (ICD-437.9) >> prev on ASA325 alone; he was Adm w/ sm stroke 4/14 & Neuro rec ASA81 + PLAVIX75... 6. 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 ~  CT Head & MRI Brain 4/14 showed sm acute infarct at the left hippocampus/ amygdala junction, extensive bilat white matter ischemic changes w/ small old white matter infarcts, global  atrophy; and branch vessel atherosclerotic changes w/ ectatic vertebrals & basilar art on MRA.... ~  CDopplers 4/14 showed bilat min plaque, no signif ICA stenoses, vertebrals antegrade... ~  Neuro eval by DrPenumalli 7/14> hx HBP & intracerebral hem, & left temporal ischemic infarct 4/14; rec to stop ASA & continue Plavix daily...  7. 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 he is counselled on a low chol/ low fat diet & exercise program to help his HDL... ~  FLP 11/08 showed TChol 133, TG 103, HDL 25, LDL 87 ~  FLP 11/09 showed TChol 113, TG 100, HDL 24, LDL 69... rec> incr exercise. ~  FLP 9/12 ==> pending  8. LIVER FUNCTION TESTS, ABNORMAL (ICD-794.8) - Hx prev GI evals:  Remote hx Etoh & drug abuse;  Hx HepA 1960's; GI eval 1979 DrWeissman w/ + HepBSAg & liver bx showing Chr Persist Hepatitis;  GI eval DrStark 2004 w/ prob fatty infiltration of liver;  LFT's ~ 70/70 11/08; ~  labs 10/09 showed SGOT= 52, SGPT= 54... rec> no hepatotoxins, diet, keep wt down. ~  labs 1/11 showed SGOT= 44, SGPT= 49 ~  Labs 9/12 ==> pending ~  GI declined to see him for this problem & he was referred to the [facility] for liver assessment (note pending)...  9. COLONIC POLYPS (ICD-211.3) 10. HEMORRHOIDS (ICD-455.6) 11. INGUINAL HERNIA, RIGHT (ICD-550.90) >> repaired by drGross, CCS in Jan09... ~  colonoscopy 7/07 by DrStark w/ mult polyps 4-62mm size (adenomatous)& hems;  f/u 3 yrs. ~  colonoscopy 11/09 by DrStark showed mult polyps 4-54mm size (adenomatous on bx), f/u 28yrs. ~  Colonoscopy 11/13 showed one polyp & int hems- Bx= tubular adenoma & f/u requested in 5 yrs...  12. PSA, INCREASED (ICD-790.93) - eval by DrNesi for Urology & pt reports that he is checked every 47mo... 13. BENIGN PROSTATIC HYPERTROPHY, HX OF (ICD-V13.8) 14. Hx of HEMATURIA UNSPECIFIED (ICD-599.70) - s/p neg cystoscopy 10/01... 15. RENAL CYST (ICD-593.2) ~  labs 6/07 showed PSA= 6.13,   ~  labs  11/08 showed PSA= 5.11 ~  12/13: note from DrNesi- BPH w/  BOO, ED, and elevPSA- on cialis20 prn, PSA was as hi as 12.58 in 2012 but dropped to 5.99 & DrNesi is checking him Q35mo...  16. 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 BACK PAIN (ICD-724.5) - hx fluctuating left/ right sides back pains... TSpine films 1/11 w/ spondylitic changes, athropathy, etc... He takes MOTRIN 800mg  Prn...   Past Surgical History  Procedure Laterality Date  . Cystoscopy and retrograde pyleogram    . Hernia repair  1965 & 1980's &2008    bilateral  . Liver biopsy  1980's    Outpatient Encounter Prescriptions as of 02/23/2013  Medication Sig Dispense Refill  . amLODipine (NORVASC) 10 MG tablet Take 1 tablet (10 mg total) by mouth daily.  90 tablet  3  . clopidogrel (PLAVIX) 75 MG tablet Take 1 tablet (75 mg total) by mouth daily with breakfast.  30 tablet  11  . furosemide (LASIX) 40 MG tablet Take 1 tablet (40 mg total) by mouth daily.  90 tablet  3  . hydrALAZINE (APRESOLINE) 25 MG tablet Take 1 tablet (25 mg total) by mouth 2 (two) times daily.  180 tablet  3  . labetalol (NORMODYNE) 200 MG tablet Take 1 tablet (200 mg total) by mouth 2 (two) times daily.  180 tablet  3  . lisinopril (PRINIVIL,ZESTRIL) 20 MG tablet Take 1 tablet (20 mg total) by mouth daily.  90 tablet  3  . meclizine (ANTIVERT) 25 MG tablet Take 1 tablet (25 mg total) by mouth every 6 (six) hours as needed for dizziness.  30 tablet  0   No facility-administered encounter medications on file as of 02/23/2013.    Allergies  Allergen Reactions  . Penicillins     REACTION: hives    Current Medications, Allergies, Past Medical History, Past Surgical History, Family History, and Social History were reviewed in [employer] record.     Review of Systems         See HPI - all other systems neg except as noted...  The patient complains of decreased hearing, dyspnea on exertion, and difficulty  walking.  The patient denies anorexia, fever, weight loss, weight gain, vision loss, hoarseness, chest pain, syncope, peripheral edema, prolonged cough, headaches, hemoptysis, abdominal pain, melena, hematochezia, severe indigestion/heartburn, hematuria, incontinence, muscle weakness, suspicious skin lesions, transient blindness, depression, unusual weight change, abnormal bleeding, enlarged lymph nodes, and angioedema.   Objective:   Physical Exam      WD, WN, 72 y/o BM in NAD... GENERAL:  Alert & oriented; pleasant & cooperative. HEENT:  [place]/AT, EOM-wnl, PERRLA, EACs-clear, TMs- red sl bulge on right, NOSE-clear, THROAT-clear & wnl. NECK:  Supple w/ fairROM; no JVD; normal carotid impulses w/o bruits; no thyromegaly or nodules palpated; no lymphadenopathy. CHEST:  Clear to P & A; without wheezes/ rales/ or rhonchi. HEART:  Regular Rhythm; without murmurs/ rubs/ or gallops. ABDOMEN:  Soft & nontender; normal bowel sounds; no organomegaly or masses detected. EXT: without deformities or arthritic changes; no varicose veins/ venous insuffic/ or edema... BACK:  no lesions seen, non-tender to palp or motion of arm/ shoulder... NEURO:  CN's intact; motor testing normal; sensory testing normal; gait normal & balance OK. DERM:  round patch on right lat thigh area- ?ring worm ?dermatitis- try rx w/ Lotrisone cream.  RADIOLOGY DATA:  Reviewed in the EPIC EMR & discussed w/ the patient...  LABORATORY DATA:  Reviewed in the EPIC EMR & discussed w/ the patient...   Assessment & Plan:  HBP & HCVD>  Hx severe HBP on 5 drug regimen & he is reminded to take all meds every day & check BP at home to be sure control is adeq; continue same.  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  LOW-HDL Chol>  Oddly his TChol looks great & LDL is ok on diet alone; he is  encouraged to incr exercise, etc...  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  Colon Polyps>  Due for f/u colon w/ DrStark 11/12 & we will refer...  Elev PSA>  Followed by Thermon Leyland every 62mo & he has appt next week (we don't have recent notes).  Hx Back Pain>  We refilled his Motrin 800mg  per request...   Patient's Medications  New Prescriptions   TADALAFIL (CIALIS) 20 MG TABLET    Take 1 tablet (20 mg total) by mouth daily as needed for erectile dysfunction.  Previous Medications   AMLODIPINE (NORVASC) 10 MG TABLET    Take 1 tablet (10 mg total) by mouth daily.   FUROSEMIDE (LASIX) 40 MG TABLET    Take 1 tablet (40 mg total) by mouth daily.   HYDRALAZINE (APRESOLINE) 25 MG TABLET    Take 1 tablet (25 mg total) by mouth 2 (two) times daily.   LABETALOL (NORMODYNE) 200 MG TABLET    Take 1 tablet (200 mg total) by mouth 2 (two) times daily.   LISINOPRIL (PRINIVIL,ZESTRIL) 20 MG TABLET    Take 1 tablet (20 mg total) by mouth daily.   MECLIZINE (ANTIVERT) 25 MG TABLET    Take 1 tablet (25 mg total) by mouth every 6 (six) hours as needed for dizziness.  Modified Medications   Modified Medication Previous Medication   CLOPIDOGREL (PLAVIX) 75 MG TABLET clopidogrel (PLAVIX) 75 MG tablet      Take 1 tablet (75 mg total) by mouth daily with breakfast.    Take 1 tablet (75 mg total) by mouth daily with breakfast.  Discontinued Medications   No medications on file

## 2013-05-05 ENCOUNTER — Telehealth: Payer: Self-pay | Admitting: Pulmonary Disease

## 2013-05-05 NOTE — Telephone Encounter (Signed)
[id number] x1 

## 2013-05-06 MED ORDER — CLOPIDOGREL BISULFATE 75 MG PO TABS: 75.0000 mg | ORAL_TABLET | Freq: Every day | ORAL | Status: DC

## 2013-05-06 MED ORDER — TADALAFIL 20 MG PO TABS: 20.0000 mg | ORAL_TABLET | Freq: Every day | ORAL | Status: DC | PRN

## 2013-05-06 NOTE — Telephone Encounter (Signed)
I spoke with the pt and he states that he is getting his meds from the [place] now and that they need Dr. Kriste Basque to give them permission to fill his meds. I asked did they advise him on how we are supposed to do this. He states they advised him that they mailed a form back in September. I advised that I do not see this in his chart. He then states taht he just needs an RX. He asked that an rx be printed for plavix and cialis so he can give this to the [place]. Pt will pick-up Rx once signed. Rx printed and placed on SN cart to sign. Carron Curie, CMA

## 2013-05-06 NOTE — Telephone Encounter (Signed)
rx have been signed and i have called pt and he will come by today to pick these up. Nothing furhter is needed.

## 2013-05-31 ENCOUNTER — Telehealth: Payer: Self-pay | Admitting: Pulmonary Disease

## 2013-05-31 DIAGNOSIS — R413 Other amnesia: Secondary | ICD-10-CM

## 2013-05-31 NOTE — Telephone Encounter (Signed)
Pt aware and referral placed. Dustin Hamilton, CMA  

## 2013-05-31 NOTE — Telephone Encounter (Signed)
Per SN----  Needs neuro eval---ok to refer to neurology for eval of memory.

## 2013-05-31 NOTE — Telephone Encounter (Signed)
Spoke with pt. He reports he has noticed he is having problems with remembering things that he has done and also a loss of balance when he walks. Reports this has been going on for about 3 months but getting worse. He stated he does not have a problem with memory every day. Please advise SN thanks  Allergies  Allergen Reactions  . Penicillins     REACTION: hives

## 2013-06-28 ENCOUNTER — Encounter: Payer: Self-pay | Admitting: Neurology

## 2013-06-28 ENCOUNTER — Ambulatory Visit (INDEPENDENT_AMBULATORY_CARE_PROVIDER_SITE_OTHER): Payer: Medicare PPO | Admitting: Neurology

## 2013-06-28 VITALS — BP 136/84 | HR 74 | Temp 97.0°F | Resp 18 | Ht 71.0 in | Wt 189.0 lb

## 2013-06-28 DIAGNOSIS — I6350 Cerebral infarction due to unspecified occlusion or stenosis of unspecified cerebral artery: Secondary | ICD-10-CM

## 2013-06-28 DIAGNOSIS — F0150 Vascular dementia without behavioral disturbance: Secondary | ICD-10-CM

## 2013-06-28 DIAGNOSIS — I6389 Other cerebral infarction: Secondary | ICD-10-CM

## 2013-06-28 DIAGNOSIS — I679 Cerebrovascular disease, unspecified: Secondary | ICD-10-CM

## 2013-06-28 DIAGNOSIS — R413 Other amnesia: Secondary | ICD-10-CM
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
## 2013-06-28 NOTE — Patient Instructions (Addendum)
I think the memory problems are due to history of stroke rather than Alzheimer's 1.  Continue plavix.  2.  No driving until you get formal evaluation.  We will refer you to an occupational therapist to assess this.  Note, insurance does not pay for this. $250 asessment  Is the cost. The evaluator driving company number is [phone] you may call this number to make appointment  3.  We will check some blood work 4.  Follow up in 6 months.

## 2013-07-19 ENCOUNTER — Other Ambulatory Visit: Payer: Self-pay | Admitting: Pulmonary Disease

## 2013-07-25 LAB — VITAMIN B12: VITAMIN B 12: 431 pg/mL (ref 211–946)

## 2013-07-25 LAB — METHYLMALONIC ACID, SERUM: Methylmalonic Acid: 105 nmol/L (ref 0–378)

## 2013-07-25 LAB — TSH: TSH: 1.23 u[IU]/mL (ref 0.450–4.500)

## 2013-07-26 ENCOUNTER — Telehealth: Payer: Self-pay | Admitting: *Deleted

## 2013-07-26 NOTE — Telephone Encounter (Signed)
Patient is aware that labs look normal

## 2013-07-26 NOTE — Telephone Encounter (Signed)
Message copied by Claudie Revering on Tue Jul 26, 2013 11:10 AM ------      Message from: JAFFE, ADAM R      Created: Tue Jul 26, 2013  6:54 AM       Blood work (B12 and thyroid) looks okay.      ----- Message -----         From: [facility] Lab Results In Interface         Sent: 07/25/2013   3:45 PM           To: Dudley Major, DO                   ------

## 2013-08-24 ENCOUNTER — Ambulatory Visit: Payer: Medicare Other | Admitting: Pulmonary Disease

## 2013-09-13 ENCOUNTER — Ambulatory Visit (INDEPENDENT_AMBULATORY_CARE_PROVIDER_SITE_OTHER): Payer: Medicare PPO | Admitting: Pulmonary Disease

## 2013-09-13 ENCOUNTER — Encounter: Payer: Self-pay | Admitting: Pulmonary Disease

## 2013-09-13 VITALS — BP 128/80 | HR 68 | Temp 97.9°F | Ht 71.0 in | Wt 189.6 lb

## 2013-09-13 DIAGNOSIS — I679 Cerebrovascular disease, unspecified: Secondary | ICD-10-CM

## 2013-09-13 DIAGNOSIS — R945 Abnormal results of liver function studies: Secondary | ICD-10-CM

## 2013-09-13 DIAGNOSIS — D126 Benign neoplasm of colon, unspecified: Secondary | ICD-10-CM

## 2013-09-13 DIAGNOSIS — F419 Anxiety disorder, unspecified: Secondary | ICD-10-CM

## 2013-09-13 DIAGNOSIS — I10 Essential (primary) hypertension: Secondary | ICD-10-CM

## 2013-09-13 DIAGNOSIS — I119 Hypertensive heart disease without heart failure: Secondary | ICD-10-CM

## 2013-09-13 DIAGNOSIS — R972 Elevated prostate specific antigen [PSA]: Secondary | ICD-10-CM

## 2013-09-13 DIAGNOSIS — Z87898 Personal history of other specified conditions: Secondary | ICD-10-CM

## 2013-09-13 DIAGNOSIS — I639 Cerebral infarction, unspecified: Secondary | ICD-10-CM

## 2013-09-13 DIAGNOSIS — E786 Lipoprotein deficiency: Secondary | ICD-10-CM

## 2013-09-13 NOTE — Progress Notes (Addendum)
Subjective:    Patient ID: Dustin Hamilton, male    DOB: 16-Jul-1940, 73 y.o.   MRN: [id number]  HPI 73 y/o BM here for a follow up visit... he has multiple medical problems as noted below...    ~  May 03, 2012:  76mo [place] has had a good year- no new complaints or concerns...  We addressed these problems during today's visit:    HBP, HCVD, abnEKG> on Labet200Bid, Amlod10, Lisin20, Lasix40, Apres25Bid; BP= 120/82 & pt is taking his meds regularly w/ good control of BP; denies CP, palpit, HA, dizzy, SOB, edema... EKG w/ ant scar, sl IVCD, NAD (Baseline is just NSSTTWA)... f/u 2DEcho to assess ant wall motion=> mild LVH, norm LVF w/ EF=60-65%, no regional wall motion abnormalities...    Cerebrovasc dis> on ASA325; he denies visual sx, HAs, dizzy, syncope, weakness, etc...    Low HDL> on diet alone; FLP shows TChol 127, TG 110, HDL 29, LDL 76; advised to incr exercise program...    GI- polyps, hems, RIH> hx mult adenomatous polyps; last colon 11/13 showed one polyp & int hems- Bx= tubular adenoma & f/u requested in 5 yrs...    Abn LFT's- prob FLD> eval by DrWeissman~1980 & DrStark 2004 (see below); last check 9/12- reviewed & repeat serology was neg x HepB Ab (prior exposure & immunity); Sonar was essent wnl as well; Labs reveal SGOT=70, SGPT=80.    GU- BPH, elevPSA, Hematuria, Renal cyst> followed by Gerilyn Pilgrim & PSA= 5.80    LBP> on Motrin as needed; known spondylitic changes on XRays... We reviewed prob list, meds, xrays and labs> see below for updates >> he has a hx of non-compliance w/ ed Rx & advised to take all meds as directed every day... CXR 12/13 showed norm heart size, tortuous Ao, elev right hemidiaph, no focal airsp dis, basilar atx, NAD.Marland KitchenMarland Kitchen EKG 12/13 showed NSR, rate61, poor R prog V1-3... 2DEcho 12/13 showed mild LVH, normal LVF w/ EF=60-65%, norm wall motion & no regional wall motion abn; Gr1DD... LABS 12/13:  FLP- at goals on diet alone;  Chems- ok x incr LFTs w/ GOT=70  GPT=80;  CBC- wnl;  TSH=1.07;  PSA=5.80 (copy to [facility]).Marland Kitchen.  ~  September 15, 2012:  110mo ROV & post hosp check> he was [facility] by [facility] 4/13 - 09/13/12 w/ transient confusion, emotional lability, & vertigo; denied HA/ n/v f/c/s etc; MRI revealed a sm ischemic stroke & Neuro consult rec adding Plavix to his ASA; all symptoms resolved back to baseline & he is reassured... We reviewed the following medical problems during today's office visit >>     HBP, HCVD, abnEKG> on Labet200Bid, Amlod10, Lisin20, Lasix40, Apres25Bid; BP= 116/76 & pt is taking his meds regularly w/ good control of BP; denies CP, palpit, HA, dizzy, SOB, edema... EKG w/ ant scar, sl IVCD, NAD (Baseline is just NSSTTWA)... f/u 2DEcho to assess ant wall motion=> mild LVH, norm LVF w/ EF=60-65%, no regional wall motion abnormalities...    Cerebrovasc dis> on ASA81 + Plavix75 now; Adm 4/14 by [facility] as above; he denies visual sx, HAs, dizzy, syncope, weakness, etc...    Low HDL> on diet alone; FLP 4/14 shows TChol 123, TG 112, HDL 25, LDL 76; advised to incr exercise program...    GI- polyps, hems, RIH> hx mult adenomatous polyps; last colon 11/13 showed one polyp & int hems- Bx= tubular adenoma & f/u requested in 5 yrs...    Abn LFT's- prob FLD> eval by DrWeissman~1980 & DrStark 2004 (  see below); last check 9/12- reviewed & repeat serology was neg x HepB Ab (prior exposure & immunity); Sonar was essent wnl as well; GI declined to see him for this prob & he was referred to the [facility]; Labs reveal SGOT=71, SGPT=53 (improved)...    GU- BPH, elevPSA, Hematuria, Renal cyst> followed by DrNesi & last seen 12/13 w/ PSA= 5.80    LBP> on Motrin as needed; known spondylitic changes on XRays... We reviewed the following medical problems during today's office visit >>  LABS 4/14:  FLP- at goals x low HDL=25;  Chems- ok & A1c=6.2;  CBC- wnl...  ~  February 23, 2013:  5-43mo ROV & Arthur saw DrPenumalli for Neuro 7/14- he stopped his ASA & treated  w/ Plavix alone;  He also tells me he is going to the [facility] & they have done labs and changed some meds- he is requested to get labs for our records and bring all meds to every visit...  On Labet200Bid, Amlod10, Lisin20, Apres25Bid, and Lasix40> BP= 130/88 and we discussed importance of daily dosing & compliance... I am concerned about the [place] changing his meds, advised careful f/u of BP...  He remains on Plavix> seen by [facility] 7/14 & ASA stopped at that time, he denies cerebral ischemic symptoms... It is unclear if he is taking Proscar5 and/or Tamsulosin0.4 per the [facility]> asked to bring all med bottles to every offiuce visit to review...  We reviewed prob list, meds, xrays and labs> see below for updates >> he refuses the 2014 Flu vaccine...  ~  September 13, 2013:  6-2mo ROV & Savalas reports a good interval w/o new complaints or concerns; he gets all his meds from the [place] but unfortunately he did not bring his med bottles today nor did he bring a current list of meds, nor does he know the names of his meds- advised to bring all med bottles to every visit!  He tells me the stopped his Labetolol and substituted another med "I take 1/2 tab twice daily" & he will call us w/ the name later...     HBP, HCVD, abnEKG> off prev Labetolol & on new med per [place], Amlod10, Lisin20, Apres25Bid, Lasix40; BP= 128/80 today- not checking BP at home; denies CP, palpit, HA, dizzy, SOB, edema... EKG w/ ant scar, sl IVCD, NAD (Baseline is just NSSTTWA)... f/u 2DEcho 12/13 $ 4/14 showed mild LVH, norm LVF w/ EF=60-65%, no regional wall motion abnormalities...    Cerebrovasc dis> on ASA81 + Plavix75 now; he denies visual sx, HAs, dizzy, syncope, weakness, etc; he had sm ischemic stroke 4/14- hosp records reviewed...     Low HDL> on diet alone; FLP 4/14 shows TChol 123, TG 112, HDL 25, LDL 76; advised to incr exercise program & get labs from the [place] for [place] to review...    GI- polyps, hems, RIH> hx mult adenomatous polyps; last colon 11/13  showed one polyp & int hems- Bx= tubular adenoma & f/u requested in 5 yrs...    Abn LFT's- prob FLD> eval by DrWeissman~1980 & DrStark 2004 (see below); last check 9/12- reviewed & repeat serology was neg x HepB Ab (prior exposure & immunity); Sonar was essent wnl as well; GI declined to see him for this prob & he was referred to the [facility]; Labs reveal SGOT=71, SGPT=53 (improved)...    GU- BPH, elevPSA, Hematuria, Renal cyst> followed by DrNesi & last seen 12/13 w/ PSA= 5.80, he is advised to get labs from the [place]  to [place] to review...    LBP> on Motrin as needed; known spondylitic changes on XRays...    Neuro> he saw [facility] 1/15> note reviewed, gait abn & memory loss, hx temporal lobe infarct, on Plavix75/d... 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 ~  CXR 1/11 showed elev right hemidiaph w/ scarring, NAD.Marland Kitchen. ~  CXR 12/13 showed norm heart size, tortuous Ao, elev right hemidiaph, no focal airsp dis, basilar atx, NAD.Marland Kitchen. 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  2. HYPERTENSION, SEVERE (ICD-401.0) - long hx severe HBP and noncompliance w/ med Rx;  resulted in hospitalization w/ small ICH 1/06;  supposed to be on a 5 drug regimen:  LABETOLOL 200mg - 2tabsBid,  HYDRALAZINE 25mg Bid,  AMLODIPINE 10mg /d,  COZAAR 100mg /d,  LASIX 40mg /d...   ~  8/12:  BP= 136/84, & similar at home- taking meds regularly he says, & tolerating well... denies HA, fatigue, visual changes, CP, palipit, syncope, dyspnea, edema, etc...  ~  12/13:  on Labet200Bid, Amlod10, Lisin20, Lasix40, Apres25Bid; BP= 120/82 & pt is taking his meds regularly w/ good control of BP; denies CP, palpit, HA, dizzy, SOB, edema... EKG w/ ant scar, sl IVCD, NAD (Baseline is just NSSTTWA)... f/u 2DEcho to assess ant wall motion=> mild LVH, norm  LVF w/ EF=60-65%, no regional wall motion abnormalities...  3. 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 ~  2D Echo 1/06 showed mod incr LV wall thickness... ~  repeat 2DEcho 12/08 showed norm LV size & function w/ EF=65% but mild incr LV wall thickness, no regional wall motion abn... ~  EKG 12/13 showed NSR, rate61, 1st degree ABV, poor R prog/ ant scar, mild IVCD, no acute STTWA... ~  2DEcho 12/13 & 4/14 ==>  Mild to mod LVH, norm LVF w/ EF=60-65%, no regional wall motion abnormalities...  4. PERS HX NONCOMPLIANCE W/MED TX PRS HAZARDS HLTH (ICD-V15.81) - BP's have been adeq controlled when he takes his meds regularly... we discuss medication compliance at each & every OV.  5. CEREBROVASCULAR DISEASE (ICD-437.9) >> prev on ASA325 alone; he was Adm w/ sm stroke 4/14 & Neuro rec ASA81 + PLAVIX75... 6. 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 ~  CT Head & MRI Brain 4/14 showed sm acute infarct at the left hippocampus/ amygdala junction, extensive bilat white matter ischemic changes w/ small old white matter infarcts, global atrophy; and branch vessel atherosclerotic changes w/ ectatic vertebrals & basilar art on MRA.... ~  CDopplers 4/14 showed bilat min plaque, no signif ICA stenoses, vertebrals antegrade... ~  Neuro eval by DrPenumalli 7/14> hx HBP & intracerebral hem, & left temporal ischemic infarct 4/14; rec to stop ASA & continue Plavix daily...  7. 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 he is counselled on a low chol/ low fat diet & exercise program to help his HDL... ~  [place] 11/08 showed TChol 133, TG 103, HDL 25, LDL 87 ~  FLP 11/09 showed TChol 113, TG 100, HDL 24, LDL 69... rec> incr exercise. ~  FLP 9/12 ==> pending  8. LIVER FUNCTION TESTS,  ABNORMAL (ICD-794.8) - Hx prev GI evals:  Remote hx Etoh & drug abuse;  Hx HepA 1960's; GI eval 1979 DrWeissman w/ + HepBSAg & liver bx showing Chr Persist Hepatitis;  GI eval DrStark 2004 w/ prob fatty infiltration of liver;  LFT's ~ 70/70 11/08; ~  labs 10/09 showed SGOT= 52, SGPT= 54... rec> no hepatotoxins, diet, keep wt down. ~  labs 1/11 showed SGOT= 44, SGPT= 49 ~  Labs 9/12 ==> pending ~  GI declined to see him for this problem & he was referred to the [facility] for liver assessment (note pending)...  9. COLONIC POLYPS (ICD-211.3) 10. HEMORRHOIDS (ICD-455.6) 11. INGUINAL HERNIA, RIGHT (ICD-550.90) >> repaired by drGross, CCS in Jan09... ~  colonoscopy 7/07 by DrStark w/ mult polyps 4-15mm size (adenomatous)& hems;  f/u 3 yrs. ~  colonoscopy 11/09 by DrStark showed mult polyps 4-37mm size (adenomatous on bx), f/u 27yrs. ~  Colonoscopy 11/13 showed one polyp & int hems- Bx= tubular adenoma & f/u requested in 5 yrs...  12. PSA, INCREASED (ICD-790.93) - eval by DrNesi for Urology & pt reports that he is checked every 18mo... 13. BENIGN PROSTATIC HYPERTROPHY, HX OF (ICD-V13.8) 14. Hx of HEMATURIA UNSPECIFIED (ICD-599.70) - s/p neg cystoscopy 10/01... 15. RENAL CYST (ICD-593.2) ~  labs 6/07 showed PSA= 6.13,   ~  labs 11/08 showed PSA= 5.11 ~  12/13: note from [facility]- BPH w/ BOO, ED, and elevPSA- on cialis20 prn, PSA was as hi as 12.58 in 2012 but dropped to 5.99 & DrNesi is checking him Q6mo...  16. 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 BACK PAIN (ICD-724.5) - hx fluctuating left/ right sides back pains... TSpine films 1/11 w/ spondylitic changes, athropathy, etc... He takes MOTRIN 800mg  Prn...   Past Surgical History  Procedure Laterality Date  . Cystoscopy and retrograde pyleogram    . Hernia repair  1965 & 1980's &2008    bilateral  . Liver biopsy  1980's    Outpatient Encounter Prescriptions as of 09/13/2013  Medication Sig  . amLODipine  (NORVASC) 10 MG tablet TAKE ONE TABLET BY MOUTH EVERY DAY  . chlorzoxazone (PARAFON) 500 MG tablet TAKE ONE TABLET BY MOUTH THREE TIMES DAILY AS NEEDED FOR MUSCLE SPASM  . clopidogrel (PLAVIX) 75 MG tablet Take 1 tablet (75 mg total) by mouth daily with breakfast.  . furosemide (LASIX) 40 MG tablet Take 1 tablet (40 mg total) by mouth daily.  . hydrALAZINE (APRESOLINE) 25 MG tablet TAKE ONE TABLET BY MOUTH TWICE DAILY  . lisinopril (PRINIVIL,ZESTRIL) 20 MG tablet Take 1 tablet (20 mg total) by mouth daily.  . meclizine (ANTIVERT) 25 MG tablet Take 1 tablet (25 mg total) by mouth every 6 (six) hours as needed for dizziness.  . tadalafil (CIALIS) 20 MG tablet Take 1 tablet (20 mg total) by mouth daily as needed for erectile dysfunction.    Allergies  Allergen Reactions  . Penicillins     REACTION: hives    Current Medications, Allergies, Past Medical History, Past Surgical History, Family History, and Social History were reviewed in [employer] record.     Review of Systems         See HPI - all other systems neg except as noted...  The patient complains of decreased hearing, dyspnea on exertion, and difficulty walking.  The patient denies anorexia, fever, weight loss, weight gain, vision loss, hoarseness, chest pain, syncope, peripheral edema, prolonged cough, headaches, hemoptysis, abdominal pain,  melena, hematochezia, severe indigestion/heartburn, hematuria, incontinence, muscle weakness, suspicious skin lesions, transient blindness, depression, unusual weight change, abnormal bleeding, enlarged lymph nodes, and angioedema.   Objective:   Physical Exam      WD, WN, 72 y/o BM in NAD... GENERAL:  Alert & oriented; pleasant & cooperative. HEENT:  [place]/AT, EOM-wnl, PERRLA, EACs-clear, TMs- red sl bulge on right, NOSE-clear, THROAT-clear & wnl. NECK:  Supple w/ fairROM; no JVD; normal carotid impulses w/o bruits; no thyromegaly or nodules palpated; no  lymphadenopathy. CHEST:  Clear to P & A; without wheezes/ rales/ or rhonchi. HEART:  Regular Rhythm; without murmurs/ rubs/ or gallops. ABDOMEN:  Soft & nontender; normal bowel sounds; no organomegaly or masses detected. EXT: without deformities or arthritic changes; no varicose veins/ venous insuffic/ or edema... BACK:  no lesions seen, non-tender to palp or motion of arm/ shoulder... NEURO:  CN's intact; motor testing normal; sensory testing normal; gait normal & balance OK. DERM:  round patch on right lat thigh area- ?ring worm ?dermatitis- try rx w/ Lotrisone cream.  RADIOLOGY DATA:  Reviewed in the EPIC EMR & discussed w/ the patient...  LABORATORY DATA:  Reviewed in the EPIC EMR & discussed w/ the patient...   Assessment & Plan:    HBP & HCVD>  Hx severe HBP on 5 drug regimen & he is reminded to take all meds every day & check BP at home to be sure control is adeq; continue same.  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  LOW-HDL Chol>  Oddly his TChol looks great & LDL is ok on diet alone; he is encouraged to incr exercise, etc...  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  Colon Polyps>  Due for f/u colon w/ DrStark 11/12 & we will refer...  Elev PSA>  Followed by Gerilyn Pilgrim every 11mo & he has appt next week (we don't have recent notes).  Hx Back Pain>  We refilled his Motrin 800mg  per request...   Patient's Medications  New Prescriptions   No medications on file  Previous Medications   AMLODIPINE (NORVASC) 10 MG TABLET    TAKE ONE TABLET BY MOUTH EVERY DAY   CHLORZOXAZONE (PARAFON) 500 MG TABLET    TAKE ONE TABLET BY MOUTH THREE TIMES DAILY AS NEEDED FOR MUSCLE SPASM   CLOPIDOGREL (PLAVIX) 75 MG  TABLET    Take 1 tablet (75 mg total) by mouth daily with breakfast.   FUROSEMIDE (LASIX) 40 MG TABLET    Take 1 tablet (40 mg total) by mouth daily.   HYDRALAZINE (APRESOLINE) 25 MG TABLET    TAKE ONE TABLET BY MOUTH TWICE DAILY   LISINOPRIL (PRINIVIL,ZESTRIL) 20 MG TABLET    Take 1 tablet (20 mg total) by mouth daily.   MECLIZINE (ANTIVERT) 25 MG TABLET    Take 1 tablet (25 mg total) by mouth every 6 (six) hours as needed for dizziness.   TADALAFIL (CIALIS) 20 MG TABLET    Take 1 tablet (20 mg total) by mouth daily as needed for erectile dysfunction.  Modified Medications   No medications on file  Discontinued Medications   No medications on file

## 2013-09-13 NOTE — Patient Instructions (Signed)
Today we updated your med list in our EPIC system...    Continue your current medications the same...  Please call us w/ the name of the new BP med that the [place] started...    Remember to bring all of your med bottles to every office visit for review w/ your doctors...  Please return to our lab in the AM for your FASTING blood work...     We will contact you w/ the results when available...   Gradually increase your exercise program to improve your aerobic conditioning...     Consider joining the "Y" program or "silver sneakers"...  Call for any questions or if we can be of service in any way.Marland KitchenMarland Kitchen

## 2013-11-23 ENCOUNTER — Other Ambulatory Visit: Payer: Self-pay | Admitting: Pulmonary Disease

## 2013-11-23 DIAGNOSIS — I119 Hypertensive heart disease without heart failure: Secondary | ICD-10-CM

## 2013-11-23 DIAGNOSIS — I10 Essential (primary) hypertension: Secondary | ICD-10-CM

## 2013-12-26 ENCOUNTER — Ambulatory Visit (INDEPENDENT_AMBULATORY_CARE_PROVIDER_SITE_OTHER): Payer: Medicare PPO | Admitting: Neurology

## 2013-12-26 ENCOUNTER — Encounter: Payer: Self-pay | Admitting: Neurology

## 2013-12-26 VITALS — BP 126/82 | HR 68 | Temp 98.7°F | Resp 16 | Wt 187.8 lb

## 2013-12-26 DIAGNOSIS — F0150 Vascular dementia without behavioral disturbance: Secondary | ICD-10-CM

## 2013-12-26 DIAGNOSIS — I679 Cerebrovascular disease, unspecified: Secondary | ICD-10-CM

## 2013-12-26 MED ORDER — DONEPEZIL HCL 5 MG PO TABS: 5.0000 mg | ORAL_TABLET | Freq: Every day | ORAL | Status: DC
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
## 2013-12-26 NOTE — Patient Instructions (Addendum)
1.  We will start donepezil (Aricept) 5mg  daily for four weeks.  If you are tolerating the medication, then after four weeks, we will increase the dose to 10mg  daily.  Side effects include nausea, vomiting, diarrhea, vivid dreams, and muscle cramps.  Please call the clinic if you experience any of these symptoms. 2.  Continue plavix 3.  Follow up 6 months.

## 2013-12-29 ENCOUNTER — Ambulatory Visit: Payer: Medicare PPO | Admitting: Internal Medicine

## 2014-02-16 ENCOUNTER — Ambulatory Visit: Payer: Medicare PPO | Admitting: Internal Medicine

## 2014-03-01 ENCOUNTER — Encounter: Payer: Self-pay | Admitting: Internal Medicine

## 2014-06-29 ENCOUNTER — Encounter: Payer: Self-pay | Admitting: Neurology

## 2014-06-29 ENCOUNTER — Ambulatory Visit (INDEPENDENT_AMBULATORY_CARE_PROVIDER_SITE_OTHER): Payer: Medicare PPO | Admitting: Neurology

## 2014-06-29 VITALS — BP 132/92 | HR 78 | Temp 98.6°F | Resp 16 | Ht 71.0 in | Wt 187.8 lb

## 2014-06-29 DIAGNOSIS — F0150 Vascular dementia without behavioral disturbance: Secondary | ICD-10-CM

## 2014-06-29 MED ORDER — DONEPEZIL HCL 10 MG PO TABS: 10.0000 mg | ORAL_TABLET | Freq: Every day | ORAL | Status: DC
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
## 2014-06-29 NOTE — Patient Instructions (Signed)
Start Aricept 10mg  daily Continue Plavix Check lipid panel  Follow up in 6 months

## 2014-07-13 ENCOUNTER — Other Ambulatory Visit: Payer: Self-pay | Admitting: Pulmonary Disease

## 2014-07-18 ENCOUNTER — Telehealth: Payer: Self-pay | Admitting: Pulmonary Disease

## 2014-07-18 ENCOUNTER — Telehealth: Payer: Self-pay | Admitting: *Deleted

## 2014-07-18 NOTE — Telephone Encounter (Signed)
Refill request for Parafon (Muscle relaxer) and Ibuprofen.  Ok to refill?

## 2014-07-18 NOTE — Telephone Encounter (Signed)
Duplicate message. Will close this message.

## 2014-08-01 ENCOUNTER — Telehealth: Payer: Self-pay | Admitting: Pulmonary Disease

## 2014-08-01 NOTE — Telephone Encounter (Signed)
Per SN: Okay to set up Physical appt, pt needs to bring all med bottles to appt (bring PRN and Maint. meds)

## 2014-08-01 NOTE — Telephone Encounter (Signed)
Pt returned call made him an appoint 3/2 @10 :30.Stanley A Dalton

## 2014-08-01 NOTE — Telephone Encounter (Signed)
lmtcb x1 

## 2014-08-01 NOTE — Telephone Encounter (Signed)
Pt last seen 08/2013 by SN.    Dr Lenna Gilford please advise if you are ok with seeing pt for physical.  Thanks!  Allergies  Allergen Reactions  . Penicillins     REACTION: hives   Current Outpatient Prescriptions on File Prior to Visit  Medication Sig Dispense Refill  . amLODipine (NORVASC) 10 MG tablet TAKE ONE TABLET BY MOUTH EVERY DAY 90 tablet 0  . chlorzoxazone (PARAFON) 500 MG tablet TAKE ONE TABLET BY MOUTH THREE TIMES DAILY AS NEEDED FOR MUSCLE SPASM 90 tablet 0  . clopidogrel (PLAVIX) 75 MG tablet Take 1 tablet (75 mg total) by mouth daily with breakfast. 30 tablet 11  . donepezil (ARICEPT) 10 MG tablet Take 1 tablet (10 mg total) by mouth at bedtime. 30 tablet 5  . furosemide (LASIX) 40 MG tablet Take 1 tablet (40 mg total) by mouth daily. 90 tablet 3  . hydrALAZINE (APRESOLINE) 25 MG tablet TAKE ONE TABLET BY MOUTH TWICE DAILY 180 tablet 0  . lisinopril (PRINIVIL,ZESTRIL) 20 MG tablet Take 1 tablet (20 mg total) by mouth daily. 90 tablet 3  . meclizine (ANTIVERT) 25 MG tablet Take 1 tablet (25 mg total) by mouth every 6 (six) hours as needed for dizziness. 30 tablet 0  . tadalafil (CIALIS) 20 MG tablet Take 1 tablet (20 mg total) by mouth daily as needed for erectile dysfunction. 10 tablet 6   No current facility-administered medications on file prior to visit.

## 2014-08-02 ENCOUNTER — Encounter: Payer: Self-pay | Admitting: Pulmonary Disease

## 2014-08-02 ENCOUNTER — Ambulatory Visit (INDEPENDENT_AMBULATORY_CARE_PROVIDER_SITE_OTHER): Payer: Medicare PPO | Admitting: Pulmonary Disease

## 2014-08-02 VITALS — BP 124/88 | HR 66 | Temp 97.6°F | Ht 71.0 in | Wt 186.2 lb

## 2014-08-02 DIAGNOSIS — R972 Elevated prostate specific antigen [PSA]: Secondary | ICD-10-CM

## 2014-08-02 DIAGNOSIS — E786 Lipoprotein deficiency: Secondary | ICD-10-CM

## 2014-08-02 DIAGNOSIS — N401 Enlarged prostate with lower urinary tract symptoms: Secondary | ICD-10-CM

## 2014-08-02 DIAGNOSIS — I639 Cerebral infarction, unspecified: Secondary | ICD-10-CM

## 2014-08-02 DIAGNOSIS — D126 Benign neoplasm of colon, unspecified: Secondary | ICD-10-CM

## 2014-08-02 DIAGNOSIS — I10 Essential (primary) hypertension: Secondary | ICD-10-CM

## 2014-08-02 DIAGNOSIS — I679 Cerebrovascular disease, unspecified: Secondary | ICD-10-CM

## 2014-08-02 DIAGNOSIS — I119 Hypertensive heart disease without heart failure: Secondary | ICD-10-CM

## 2014-08-02 DIAGNOSIS — I6330 Cerebral infarction due to thrombosis of unspecified cerebral artery: Secondary | ICD-10-CM

## 2014-08-02 DIAGNOSIS — R42 Dizziness and giddiness: Secondary | ICD-10-CM | POA: Insufficient documentation

## 2014-08-02 DIAGNOSIS — N138 Other obstructive and reflux uropathy: Secondary | ICD-10-CM

## 2014-08-02 MED ORDER — MECLIZINE HCL 25 MG PO TABS: ORAL_TABLET | ORAL | Status: DC

## 2014-08-02 NOTE — Progress Notes (Signed)
Subjective:    Patient ID: Dustin Hamilton, male    DOB: 05/20/1941, 74 y.o.   MRN: [id number]  HPI 74 y/o BM here for a follow up visit... he has multiple medical problems as noted below...   ~  SEE PREV NOTES FOR OLDER DATA >>   ~  September 15, 2012:  20mo ROV & post hosp check> he was [facility] by [facility] 4/13 - 09/13/12 w/ transient confusion, emotional lability, & vertigo; denied HA/ n/v f/c/s etc; MRI revealed a sm ischemic stroke & Neuro consult rec adding Plavix to his ASA; all symptoms resolved back to baseline & he is reassured... We reviewed the following medical problems during today's office visit >>     HBP, HCVD, abnEKG> on Labet200Bid, Amlod10, Lisin20, Lasix40, Apres25Bid; BP= 116/76 & pt is taking his meds regularly w/ good control of BP; denies CP, palpit, HA, dizzy, SOB, edema... EKG w/ ant scar, sl IVCD, NAD (Baseline is just NSSTTWA)... f/u 2DEcho to assess ant wall motion=> mild LVH, norm LVF w/ EF=60-65%, no regional wall motion abnormalities...    Cerebrovasc dis> on ASA81 + Plavix75 now; Adm 4/14 by [facility] as above; he denies visual sx, HAs, dizzy, syncope, weakness, etc...    Low HDL> on diet alone; FLP 4/14 shows TChol 123, TG 112, HDL 25, LDL 76; advised to incr exercise program...    GI- polyps, hems, RIH> hx mult adenomatous polyps; last colon 11/13 showed one polyp & int hems- Bx= tubular adenoma & f/u requested in 5 yrs...    Abn LFT's- prob FLD> eval by DrWeissman~1980 & DrStark 2004 (see below); last check 9/12- reviewed & repeat serology was neg x HepB Ab (prior exposure & immunity); Sonar was essent wnl as well; GI declined to see him for this prob & he was referred to the [facility]; Labs reveal SGOT=71, SGPT=53 (improved)...    GU- BPH, elevPSA, Hematuria, Renal cyst> followed by DrNesi & last seen 12/13 w/ PSA= 5.80    LBP> on Motrin as needed; known spondylitic changes on XRays... We reviewed the following medical problems during today's office visit >>    LABS 4/14:  FLP- at goals x low HDL=25;  Chems- ok & A1c=6.2;  CBC- wnl...  ~  February 23, 2013:  5-38mo ROV & Dustin Hamilton saw DrPenumalli for Neuro 7/14- he stopped his ASA & treated w/ Plavix alone;  He also tells me he is going to the [facility] & they have done labs and changed some meds- he is requested to get labs for our records and bring all meds to every visit...  On Labet200Bid, Amlod10, Lisin20, Apres25Bid, and Lasix40> BP= 130/88 and we discussed importance of daily dosing & compliance... I am concerned about the [place] changing his meds, advised careful f/u of BP...  He remains on Plavix> seen by [facility] 7/14 & ASA stopped at that time, he denies cerebral ischemic symptoms... It is unclear if he is taking Proscar5 and/or Tamsulosin0.4 per the [facility]> asked to bring all med bottles to every offiuce visit to review...  We reviewed prob list, meds, xrays and labs> see below for updates >> he refuses the 2014 Flu vaccine...  ~  September 13, 2013:  6-57mo ROV & Dustin Hamilton reports a good interval w/o new complaints or concerns; he gets all his meds from the [place] but unfortunately he did not bring his med bottles today nor did he bring a current list of meds, nor does he know the names of his meds- advised to  bring all med bottles to every visit!  He tells me the stopped his Labetolol and substituted another med "I take 1/2 tab twice daily" & he will call us w/ the name later...     HBP, HCVD, abnEKG> off prev Labetolol & on new med per [place], Amlod10, Lisin20, Apres25Bid, Lasix40; BP= 128/80 today- not checking BP at home; denies CP, palpit, HA, dizzy, SOB, edema... EKG w/ ant scar, sl IVCD, NAD (Baseline is just NSSTTWA)... f/u 2DEcho 12/13 $ 4/14 showed mild LVH, norm LVF w/ EF=60-65%, no regional wall motion abnormalities...    Cerebrovasc dis> on ASA81 + Plavix75 now; he denies visual sx, HAs, dizzy, syncope, weakness, etc; he had sm ischemic stroke 4/14- hosp records reviewed...     Low HDL> on diet alone; FLP 4/14  shows TChol 123, TG 112, HDL 25, LDL 76; advised to incr exercise program & get labs from the [place] for [place] to review...    GI- polyps, hems, RIH> hx mult adenomatous polyps; last colon 11/13 showed one polyp & int hems- Bx= tubular adenoma & f/u requested in 5 yrs...    Abn LFT's- prob FLD> eval by DrWeissman~1980 & DrStark 2004 (see below); last check 9/12- reviewed & repeat serology was neg x HepB Ab (prior exposure & immunity); Sonar was essent wnl as well; GI declined to see him for this prob & he was referred to the [facility]; Labs reveal SGOT=71, SGPT=53 (improved)...    GU- BPH, elevPSA, Hematuria, Renal cyst> followed by DrNesi & last seen 12/13 w/ PSA= 5.80, he is advised to get labs from the [place] to [place] to review...    LBP> on Motrin as needed; known spondylitic changes on XRays...    Neuro> he saw [facility] 1/15> note reviewed, gait abn & memory loss, hx temporal lobe infarct, on Plavix75/d... We reviewed prob list, meds, xrays and labs> see below for updates >>   ~  August 02, 2014:  70mo ROV & Dustin Hamilton returns but tells me he is still going to the [place] ~3 times per yr, gets meds from them & again forgot to bring list of [facility] med[facility], med bottles, prev [facility] records or labs... He also follows w/ [facility]- DrJaffe and last seen 1/16 (note reviewed by me) for f/u hx intracerebral hem, cerebrovasc dis, hx alcohol & drug abuse, and vascular dementia; noted to be overall stable- memory fluctuates, on Aricept10... He is c/o some balance dysfunction & we have rec that he restart his ASA81 & Meclizine 25mg  prn... We reviewed the following medical problems during today's office visit >>     HBP, HCVD, abnEKG> on med per [place]- Amlod10, Lisin20, Apres25Bid, Lasix40; BP= 124/88 today- not checking BP at home; denies CP, palpit, HA, dizzy, SOB, edema... EKG w/ ant scar, sl IVCD, NAD (Baseline is just NSSTTWA)... 2DEcho 12/13 $ 4/14 showed mild LVH, norm LVF w/ EF=60-65%, no regional wall motion  abnormalities...    Cerebrovasc dis> on Plavix75 alone but c/o some dizziness; he denies visual sx, HAs, syncope, weakness, etc; he had sm ischemic stroke 4/14- hosp records reviewed; rec to restart ASA81 & Meclizine 25mg  prn...     Low HDL> on diet alone; FLP 4/14 shows TChol 123, TG 112, HDL 25, LDL 76; advised to incr exercise program & get labs from the [place] for [place] to review...    GI- polyps, hems, RIH> hx mult adenomatous polyps; last colon 11/13 showed one polyp & int hems- Bx= tubular adenoma & f/u  requested in 5 yrs...    Abn LFT's- prob FLD> eval by DrWeissman~1980 & DrStark 2004 (see below); last check 9/12- reviewed & repeat serology was neg x HepB Ab (prior exposure & immunity); Sonar was essent wnl as well; GI declined to see him for this prob & he was referred to the [facility]; last Labs here 4/14 reveal SGOT=71, SGPT=53 (improved); he is asked to get [facility] records for [place] to review...    GU- BPH, elevPSA, Hematuria, Renal cyst> followed by DrNesi Q45mo he says but we do not have recent notes or labs from him...    LBP> on Motrin as needed; known spondylitic changes on XRays...    Neuro> he saw [facility] 1/16> note reviewed, gait abn & memory loss, hx temporal lobe infarct, on Plavix75/d... We reviewed prob list, meds, xrays and labs> see below for updates >>  PLAN>> we discussed restarting ASA81 & Rx for Meclizing 25mg  prn; he will continue meds from [place] request records, med list etc to be sent to [place] for [facility] records...            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 ~  CXR 1/11 showed elev right hemidiaph w/ scarring, NAD.Marland Kitchen. ~  CXR 12/13 showed norm heart size, tortuous Ao, elev right hemidiaph, no focal airsp dis, basilar atx, NAD.Marland Kitchen. 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  2. HYPERTENSION, SEVERE (ICD-401.0) - long hx severe HBP and noncompliance w/ med  Rx;  resulted in hospitalization w/ small ICH 1/06;  supposed to be on a 5 drug regimen:  LABETOLOL 200mg - 2tabsBid,  HYDRALAZINE 25mg Bid,  AMLODIPINE 10mg /d,  COZAAR 100mg /d,  LASIX 40mg /d...   ~  8/12:  BP= 136/84, & similar at home- taking meds regularly he says, & tolerating well... denies HA, fatigue, visual changes, CP, palipit, syncope, dyspnea, edema, etc...  ~  12/13:  on Labet200Bid, Amlod10, Lisin20, Lasix40, Apres25Bid; BP= 120/82 & pt is taking his meds regularly w/ good control of BP; denies CP, palpit, HA, dizzy, SOB, edema... EKG w/ ant scar, sl IVCD, NAD (Baseline is just NSSTTWA)... f/u 2DEcho to assess ant wall motion=> mild LVH, norm LVF w/ EF=60-65%, no regional wall motion abnormalities...  3. 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 ~  2D Echo 1/06 showed mod incr LV wall thickness... ~  repeat 2DEcho 12/08 showed norm LV size & function w/ EF=65% but mild incr LV wall thickness, no regional wall motion abn... ~  EKG 12/13 showed NSR, rate61, 1st degree ABV, poor R prog/ ant scar, mild IVCD, no acute STTWA... ~  2DEcho 12/13 & 4/14 ==>  Mild to mod LVH, norm LVF w/ EF=60-65%, no regional wall motion abnormalities...  4. PERS HX NONCOMPLIANCE W/MED TX PRS HAZARDS HLTH (ICD-V15.81) - BP's have been adeq controlled when he takes his meds regularly... we discuss medication compliance at each & every OV.  5. CEREBROVASCULAR DISEASE (ICD-437.9) >> prev on ASA325 alone; he was Adm w/ sm stroke 4/14 & Neuro rec ASA81 + PLAVIX75... 6. 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 ~  CT Head & MRI Brain 4/14 showed sm acute infarct at the left hippocampus/ amygdala junction, extensive bilat white matter ischemic changes w/  small old white matter infarcts, global atrophy; and branch  vessel atherosclerotic changes w/ ectatic vertebrals & basilar art on MRA.... ~  CDopplers 4/14 showed bilat min plaque, no signif ICA stenoses, vertebrals antegrade... ~  Neuro eval by DrPenumalli 7/14> hx HBP & intracerebral hem, & left temporal ischemic infarct 4/14; rec to stop ASA & continue Plavix daily...  7. 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 he is counselled on a low chol/ low fat diet & exercise program to help his HDL... ~  [place] 11/08 showed TChol 133, TG 103, HDL 25, LDL 87 ~  FLP 11/09 showed TChol 113, TG 100, HDL 24, LDL 69... rec> incr exercise. ~  FLP 4/14 showed TChol 123, TG 112, HDL 25, LDL 76  8. LIVER FUNCTION TESTS, ABNORMAL (ICD-794.8) - Hx prev GI evals:  Remote hx Etoh & drug abuse;  Hx HepA 1960's; GI eval 1979 DrWeissman w/ + HepBSAg & liver bx showing Chr Persist Hepatitis;  GI eval DrStark 2004 w/ prob fatty infiltration of liver;  LFT's ~ 70/70 11/08; ~  labs 10/09 showed SGOT= 52, SGPT= 54... rec> no hepatotoxins, diet, keep wt down. ~  labs 1/11 showed SGOT= 44, SGPT= 49 ~  Labs 9/12 ==> pending ~  GI declined to see him for this problem & he was referred [facility] for liver assessment (note pending)...  9. COLONIC POLYPS (ICD-211.3) 10. HEMORRHOIDS (ICD-455.6) 11. INGUINAL HERNIA, RIGHT (ICD-550.90) >> repaired by drGross, C[facility] in Jan09... ~  colonoscopy 7/07 by DrStark w/ mult polyps 4-70mm size (adenomatous)& hems;  f/u 3 yrs. ~  colonoscopy 11/09 by DrStark showed mult polyps 4-67mm size (adenomatous on bx), f/u 44yrs. ~  Colonoscopy 11/13 showed one polyp & int hems- Bx= tubular adenoma & f/u requested in 5 yrs...  12. PSA, INCREASED (ICD-790.93) - eval by DrNesi for Urology & pt reports that he is checked every 12mo... 13. BENIGN PROSTATIC HYPERTROPHY, HX OF (ICD-V13.8) 14. Hx of HEMATURIA UNSPECIFIED (ICD-599.70) - s/p neg cystoscopy 10/01... 15. RENAL CYST (ICD-593.2) ~  labs 6/07 showed PSA= 6.13,    ~  labs 11/08 showed PSA= 5.11 ~  12/13: note from [facility]- BPH w/ BOO, ED, and elevPSA- on cialis20 prn, PSA was as hi as 12.58 in 2012 but dropped to 5.99 & DrNesi is checking him Q63mo...  16. 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 BACK PAIN (ICD-724.5) - hx fluctuating left/ right sides back pains... TSpine films 1/11 w/ spondylitic changes, athropathy, etc... He takes MOTRIN 800mg  Prn...   Past Surgical History  Procedure Laterality Date  . Cystoscopy and retrograde pyleogram    . Hernia repair  1965 & 1980's &2008    bilateral  . Liver biopsy  1980's    Outpatient Encounter Prescriptions as of 08/02/2014  Medication Sig  . amLODipine (NORVASC) 10 MG tablet TAKE ONE TABLET BY MOUTH EVERY DAY  . chlorzoxazone (PARAFON) 500 MG tablet TAKE ONE TABLET BY MOUTH THREE TIMES DAILY AS NEEDED FOR MUSCLE SPASM  . clopidogrel (PLAVIX) 75 MG tablet Take 1 tablet (75 mg total) by mouth daily with breakfast.  . donepezil (ARICEPT) 10 MG tablet Take 1 tablet (10 mg total) by mouth at bedtime.  . furosemide (LASIX) 40 MG tablet Take 1 tablet (40 mg total) by mouth daily.  . hydrALAZINE (APRESOLINE) 25 MG tablet TAKE ONE TABLET BY MOUTH TWICE DAILY  . lisinopril (PRINIVIL,ZESTRIL) 20 MG tablet Take 1  tablet (20 mg total) by mouth daily.  . meclizine (ANTIVERT) 25 MG tablet Take 1 tablet (25 mg total) by mouth every 6 (six) hours as needed for dizziness.  . tadalafil (CIALIS) 20 MG tablet Take 1 tablet (20 mg total) by mouth daily as needed for erectile dysfunction.    Allergies  Allergen Reactions  . Penicillins     REACTION: hives    Current Medications, Allergies, Past Medical History, Past Surgical History, Family History, and Social History were reviewed in [employer] record.     Review of Systems         See HPI - all other systems neg except as noted...  The patient complains of decreased hearing, dyspnea on exertion, and difficulty  walking.  The patient denies anorexia, fever, weight loss, weight gain, vision loss, hoarseness, chest pain, syncope, peripheral edema, prolonged cough, headaches, hemoptysis, abdominal pain, melena, hematochezia, severe indigestion/heartburn, hematuria, incontinence, muscle weakness, suspicious skin lesions, transient blindness, depression, unusual weight change, abnormal bleeding, enlarged lymph nodes, and angioedema.   Objective:   Physical Exam      WD, WN, 74 y/o BM in NAD... GENERAL:  Alert & oriented; pleasant & cooperative. HEENT:  [place]/AT, EOM-wnl, PERRLA, EACs-clear, TMs- red sl bulge on right, NOSE-clear, THROAT-clear & wnl. NECK:  Supple w/ fairROM; no JVD; normal carotid impulses w/o bruits; no thyromegaly or nodules palpated; no lymphadenopathy. CHEST:  Clear to P & A; without wheezes/ rales/ or rhonchi. HEART:  Regular Rhythm; without murmurs/ rubs/ or gallops. ABDOMEN:  Soft & nontender; normal bowel sounds; no organomegaly or masses detected. EXT: without deformities or arthritic changes; no varicose veins/ venous insuffic/ or edema... BACK:  no lesions seen, non-tender to palp or motion of arm/ shoulder... NEURO:  CN's intact; motor testing normal; sensory testing normal; gait normal & balance OK. DERM:  round patch on right lat thigh area- ?ring worm ?dermatitis- try rx w/ Lotrisone cream.  RADIOLOGY DATA:  Reviewed in the EPIC EMR & discussed w/ the patient...  LABORATORY DATA:  Reviewed in the EPIC EMR & discussed w/ the patient...   Assessment & Plan:    HBP & HCVD>  Hx severe HBP on 4 drug regimen & he is reminded to take all meds every day & check BP at home to be sure control is adeq; continue same. Pt is reminded to get [place] records sent to [place] 7 to bring all med bottles to every office visit...  Hx ICH in 2006 related to medication non-compliance; Adm 4/14 w/ ischemic stroke from intracranial atherosclerotic dis, known sm vessel dis & atrophy on scan; now on  Plavix75 alone...  LOW-HDL Chol>  Oddly his TChol looks great & LDL is ok on diet alone; he is encouraged to incr exercise, etc...  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  Colon Polyps>  He had f/u colon w/ DrStark 11/13 & sessile polyp in asc colon was removed (adenoma)=> f/u planned in 5 yrs...  Elev PSA>  Followed by Gerilyn Pilgrim every 41mo & he has appt next week (we don't have recent notes).  Hx Back Pain>  We refilled his Motrin 800mg  per request...   Patient's Medications  New Prescriptions   No medications on file  Previous Medications   AMLODIPINE (NORVASC) 10 MG TABLET    TAKE ONE TABLET BY  MOUTH EVERY DAY   ASPIRIN 81 MG TABLET    Take 81 mg by mouth daily.   CHLORZOXAZONE (PARAFON) 500 MG TABLET    TAKE ONE TABLET BY MOUTH THREE TIMES DAILY AS NEEDED FOR MUSCLE SPASM   CLOPIDOGREL (PLAVIX) 75 MG TABLET    Take 1 tablet (75 mg total) by mouth daily with breakfast.   DONEPEZIL (ARICEPT) 10 MG TABLET    Take 1 tablet (10 mg total) by mouth at bedtime.   FUROSEMIDE (LASIX) 40 MG TABLET    Take 1 tablet (40 mg total) by mouth daily.   HYDRALAZINE (APRESOLINE) 25 MG TABLET    TAKE ONE TABLET BY MOUTH TWICE DAILY   LISINOPRIL (PRINIVIL,ZESTRIL) 20 MG TABLET    Take 1 tablet (20 mg total) by mouth daily.   TADALAFIL (CIALIS) 20 MG TABLET    Take 1 tablet (20 mg total) by mouth daily as needed for erectile dysfunction.  Modified Medications   Modified Medication Previous Medication   MECLIZINE (ANTIVERT) 25 MG TABLET meclizine (ANTIVERT) 25 MG tablet      Take 1/2 to 1 tablet by mouth every 6 hours as needed for dizziness    Take 1 tablet (25 mg total) by mouth every 6 (six) hours as needed for dizziness.  Discontinued Medications   No medications on file

## 2014-08-02 NOTE — Patient Instructions (Signed)
Today we updated your med list in our EPIC system...    Continue your current medications the same...  We decided to restart an enteric coated ASPIRIN 81mg  tab daily...  We wrote a new prescription for MECLIZINE 25mg - take 1/2 to 1 tab every 6h as needed for dizziness...  BE SURE TO BRING ALL YOUR MED BOTTLES TO EVERY OFFICE VISIT...  PLEASE BRING YOUR [facility] RESULTS FROM THE [facility] TO [place] TO REVIEW & SCAN INTO THE RECORD...    Or ask the [facility] to mail copies of your labs to [place]...  Call for any questions...  Let's plan a follow up visit in 46mo, sooner if needed for problems.Marland KitchenMarland Kitchen

## 2014-08-06 MED ORDER — IBUPROFEN 800 MG PO TABS: 800.0000 mg | ORAL_TABLET | Freq: Two times a day (BID) | ORAL | Status: AC | PRN

## 2014-08-06 MED ORDER — CHLORZOXAZONE 500 MG PO TABS: 500.0000 mg | ORAL_TABLET | Freq: Three times a day (TID) | ORAL | Status: DC | PRN

## 2014-10-19 ENCOUNTER — Telehealth: Payer: Self-pay | Admitting: Pulmonary Disease

## 2014-10-19 NOTE — Telephone Encounter (Signed)
[phone], pt cb

## 2014-10-19 NOTE — Telephone Encounter (Signed)
LMTCB

## 2014-10-19 NOTE — Telephone Encounter (Signed)
LMTC x 1  

## 2014-10-19 NOTE — Telephone Encounter (Signed)
lmtcb

## 2014-10-19 NOTE — Telephone Encounter (Signed)
Pt returned call - [phone]

## 2014-10-20 NOTE — Telephone Encounter (Signed)
LMTCB x2  

## 2014-10-23 NOTE — Telephone Encounter (Signed)
Called and spoke to pt. Pt stated he already has an appointment with his PCP at the [place] on 5/26. Informed pt he does not need two PCP as it can be dangerous. Pt verbalized understanding and denied any further questions or concerns at this time.

## 2014-12-28 ENCOUNTER — Ambulatory Visit: Payer: Medicare PPO | Admitting: Neurology

## 2015-01-09 ENCOUNTER — Ambulatory Visit: Payer: Medicare PPO | Admitting: Neurology

## 2015-02-02 ENCOUNTER — Ambulatory Visit: Payer: Medicare PPO | Admitting: Pulmonary Disease

## 2015-02-19 ENCOUNTER — Ambulatory Visit: Payer: Medicare PPO | Admitting: Neurology

## 2015-08-28 ENCOUNTER — Encounter: Payer: Self-pay | Admitting: Gastroenterology

## 2015-09-25 ENCOUNTER — Emergency Department (HOSPITAL_COMMUNITY): Payer: Medicare PPO

## 2015-09-25 ENCOUNTER — Emergency Department (HOSPITAL_COMMUNITY)
Admission: EM | Admit: 2015-09-25 | Discharge: 2015-09-25 | Disposition: A | Discharge status: Home / Self Care | Payer: Medicare PPO | Attending: Emergency Medicine | Admitting: Emergency Medicine

## 2015-09-25 DIAGNOSIS — Z880 Allergy status to penicillin: Secondary | ICD-10-CM | POA: Diagnosis not present

## 2015-09-25 DIAGNOSIS — Z79899 Other long term (current) drug therapy: Secondary | ICD-10-CM | POA: Diagnosis not present

## 2015-09-25 DIAGNOSIS — Z86010 Personal history of colonic polyps: Secondary | ICD-10-CM | POA: Insufficient documentation

## 2015-09-25 DIAGNOSIS — Z87891 Personal history of nicotine dependence: Secondary | ICD-10-CM | POA: Diagnosis not present

## 2015-09-25 DIAGNOSIS — I119 Hypertensive heart disease without heart failure: Secondary | ICD-10-CM | POA: Insufficient documentation

## 2015-09-25 DIAGNOSIS — Z9119 Patient's noncompliance with other medical treatment and regimen: Secondary | ICD-10-CM | POA: Diagnosis not present

## 2015-09-25 DIAGNOSIS — Q6100 Congenital renal cyst, unspecified: Secondary | ICD-10-CM | POA: Diagnosis not present

## 2015-09-25 DIAGNOSIS — Z7902 Long term (current) use of antithrombotics/antiplatelets: Secondary | ICD-10-CM | POA: Insufficient documentation

## 2015-09-25 DIAGNOSIS — Z8719 Personal history of other diseases of the digestive system: Secondary | ICD-10-CM | POA: Insufficient documentation

## 2015-09-25 DIAGNOSIS — N400 Enlarged prostate without lower urinary tract symptoms: Secondary | ICD-10-CM | POA: Diagnosis not present

## 2015-09-25 DIAGNOSIS — Z8639 Personal history of other endocrine, nutritional and metabolic disease: Secondary | ICD-10-CM | POA: Insufficient documentation

## 2015-09-25 DIAGNOSIS — R079 Chest pain, unspecified: Secondary | ICD-10-CM | POA: Insufficient documentation

## 2015-09-25 DIAGNOSIS — Z7982 Long term (current) use of aspirin: Secondary | ICD-10-CM | POA: Diagnosis not present

## 2015-09-25 LAB — BASIC METABOLIC PANEL
Anion gap: 8 (ref 5–15)
BUN: 15 mg/dL (ref 6–20)
CALCIUM: 9.9 mg/dL (ref 8.9–10.3)
CO2: 27 mmol/L (ref 22–32)
CREATININE: 1.25 mg/dL — AB (ref 0.61–1.24)
Chloride: 104 mmol/L (ref 101–111)
GFR calc non Af Amer: 55 mL/min — ABNORMAL LOW (ref >60)
Glucose, Bld: 141 mg/dL — ABNORMAL HIGH (ref 65–99)
Potassium: 4.1 mmol/L (ref 3.5–5.1)
SODIUM: 139 mmol/L (ref 135–145)

## 2015-09-25 LAB — CBC
HCT: 53.3 % — ABNORMAL HIGH (ref 39.0–52.0)
Hemoglobin: 18.7 g/dL — ABNORMAL HIGH (ref 13.0–17.0)
MCH: 31.7 pg (ref 26.0–34.0)
MCHC: 35.1 g/dL (ref 30.0–36.0)
MCV: 90.5 fL (ref 78.0–100.0)
PLATELETS: 178 10*3/uL (ref 150–400)
RBC: 5.89 MIL/uL — AB (ref 4.22–5.81)
RDW: 14.1 % (ref 11.5–15.5)
WBC: 6.4 10*3/uL (ref 4.0–10.5)

## 2015-09-25 LAB — I-STAT TROPONIN, ED
TROPONIN I, POC: 0.02 ng/mL (ref 0.00–0.08)
TROPONIN I, POC: 0.02 ng/mL (ref 0.00–0.08)

## 2015-09-25 LAB — D-DIMER, QUANTITATIVE: D-Dimer, Quant: 0.41 ug/mL-FEU (ref 0.00–0.50)

## 2015-09-25 NOTE — ED Notes (Signed)
Bed: WA13 Expected date:  Expected time:  Means of arrival:  Comments: Hold for triage 

## 2015-09-25 NOTE — ED Notes (Signed)
Per patient, states left chest pain that started this am on his way to work-states it is not new

## 2015-09-25 NOTE — ED Provider Notes (Signed)
CSN: [id number]     Arrival date & time 09/25/15  [id number] History   First MD Initiated Contact with Patient 09/25/15 1106     Chief Complaint  Patient presents with  . Chest Pain     (Consider location/radiation/quality/duration/timing/severity/associated sxs/prior Treatment) HPI  Pt presenting with centralized chest pain.  Pain started while he was driving his car.  No sob, no diaphoresis, no nausea, no radiation of pain. Pain lasted approx 1 hour- resolved upon arrival to the ED.  He has hx of hypertension and is taking 5 meds to control this.  Pt endorses forgetting some pills at times.  He and his wife returned recently from train trip to [facility].  No leg swelling. No hx of DVT/PE, no recent trauma or surgery.  Pt currently has no symptoms.  He and wife state he had a stress test 5 years ago which was normal.  He occasionally gets similar symptoms and treats himself with prilosec as he feels symptoms are most related to gas/reflux.  There are no other associated systemic symptoms, there are no other alleviating or modifying factors.   Past Medical History  Diagnosis Date  . Congenital anomaly of diaphragm   . HTN (hypertension)   . 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 Personal history of noncompliance with medical treatment, presenting hazards to health   . Cerebrovascular disease   . Unspecified intracranial hemorrhage   . Low HDL (under 40)   . Abnormal LFTs   . Adenomatous colon polyp 11/2005    TA polyp 04/2012  . Hemorrhoid   . Right inguinal hernia   . BPH (benign prostatic hyperplasia)   . Increased prostate specific antigen (PSA) velocity   . Hematuria   . Renal cyst   . Back pain   . Nondependent alcohol abuse, in remission    Past Surgical History  Procedure Laterality Date  . Cystoscopy and retrograde pyleogram    . Hernia repair  1965 & 1980's &2008    bilateral  . Liver biopsy  1980's   Family History  Problem Relation Age of Onset   . Heart disease Maternal Grandmother   . Rheum arthritis Maternal Grandmother   . Diabetes Maternal Grandmother   . Stroke Maternal Grandmother   . Rheum arthritis Mother   . Diabetes Mother   . Stroke Mother   . Heart attack Mother   . Colon cancer Neg Hx   . Heart attack Father    Social History  Substance Use Topics  . Smoking status: Former Smoker -- 1.00 packs/day for 4 years    Types: Cigarettes    Quit date: 11/30/1981  . Smokeless tobacco: Never Used  . Alcohol Use: No     Comment: quit: Oct 1963    Review of Systems  ROS reviewed and all otherwise negative except for mentioned in HPI    Allergies  Penicillins  Home Medications   Prior to Admission medications   Medication Sig Start Date End Date Taking? Authorizing Provider  amLODipine (NORVASC) 10 MG tablet TAKE ONE TABLET BY MOUTH EVERY DAY   Yes Noralee Space, MD  aspirin 81 MG tablet Take 81 mg by mouth daily.   Yes Historical Provider, MD  chlorzoxazone (PARAFON) 500 MG tablet Take 1 tablet (500 mg total) by mouth 3 (three) times daily as needed. for muscle spams 08/06/14  Yes Noralee Space, MD  clopidogrel (PLAVIX) 75 MG tablet Take 1 tablet (75 mg total) by  mouth daily with breakfast. 05/06/13  Yes Noralee Space, MD  furosemide (LASIX) 40 MG tablet Take 1 tablet (40 mg total) by mouth daily. 05/03/12  Yes Noralee Space, MD  hydrALAZINE (APRESOLINE) 25 MG tablet TAKE ONE TABLET BY MOUTH TWICE DAILY   Yes Noralee Space, MD  hydrochlorothiazide (MICROZIDE) 12.5 MG capsule Take 12.5 mg by mouth 2 (two) times daily.   Yes Historical Provider, MD  ibuprofen (ADVIL,MOTRIN) 800 MG tablet Take 800 mg by mouth daily as needed for moderate pain.   Yes Historical Provider, MD  lisinopril (PRINIVIL,ZESTRIL) 20 MG tablet Take 1 tablet (20 mg total) by mouth daily. 05/03/12  Yes Noralee Space, MD  tadalafil (CIALIS) 20 MG tablet Take 1 tablet (20 mg total) by mouth daily as needed for erectile dysfunction. 05/06/13  Yes Noralee Space, MD  donepezil (ARICEPT) 10 MG tablet Take 1 tablet (10 mg total) by mouth at bedtime. Patient not taking: Reported on 09/25/2015 06/29/14   Pieter Partridge, DO   BP 170/111 mmHg  Pulse 75  Temp(Src) 98.3 F (36.8 C) (Oral)  Resp 18  SpO2 100%  Vitals reviewed Physical Exam  Physical Examination: General appearance - alert, well appearing, and in no distress Mental status - alert, oriented to person, place, and time Eyes - no conjunctival injection, no scleral icterus Mouth - mucous membranes moist, pharynx normal without lesions Neck - supple, no significant adenopathy Chest - clear to auscultation, no wheezes, rales or rhonchi, symmetric air entry, nontender to palpation Heart - normal rate, regular rhythm, normal S1, S2, no murmurs, rubs, clicks or gallops Abdomen - soft, nontender, nondistended, no masses or organomegaly Neurological - alert, oriented, normal speech Extremities - peripheral pulses normal, no pedal edema, no clubbing or cyanosis Skin - normal coloration and turgor, no rashes  ED Course  Procedures (including critical care time) Labs Review Labs Reviewed  BASIC METABOLIC PANEL - Abnormal; Notable for the following:    Glucose, Bld 141 (*)    Creatinine, Ser 1.25 (*)    GFR calc non Af Amer 55 (*)    All other components within normal limits  CBC - Abnormal; Notable for the following:    RBC 5.89 (*)    Hemoglobin 18.7 (*)    HCT 53.3 (*)    All other components within normal limits  D-DIMER, QUANTITATIVE (NOT AT [facility])  Randolm Idol, ED  Randolm Idol, ED    Imaging Review Dg Chest 2 View  09/25/2015  CLINICAL DATA:  Left-sided chest pain since this morning EXAM: CHEST  2 VIEW COMPARISON:  09/12/2012 FINDINGS: Normal heart size. Right hemidiaphragm remains elevated with basilar atelectasis. No pneumothorax or pleural effusion. IMPRESSION: Chronic elevation of the right hemidiaphragm and basilar atelectasis. Electronically Signed   By: Marybelle Killings M.D.   On: 09/25/2015 10:12   I have personally reviewed and evaluated these images and lab results as part of my medical decision-making.   EKG Interpretation   Date/Time:  Tuesday September 25 2015 09:29:41 EDT Ventricular Rate:  89 PR Interval:  266 QRS Duration: 112 QT Interval:  362 QTC Calculation: 440 R Axis:   155 Text Interpretation:  Sinus rhythm with 1st degree A-V block Right axis  deviation Anteroseptal infarct , age undetermined Abnormal ECG No  significant change since last tracing Confirmed by [facility]  MD, Irlanda Croghan  [phone]) on 09/25/2015 11:11:19 AM      MDM   Final diagnoses:  Chest pain, unspecified chest  pain type    Pt presenting with chest pain, this has resolved upon arrival.  EKg is reassuring, 2 sets of troponins were negative, he was recently on a long trip so d-dimer was checked to further evaluate for PE- this was negative making PE less likely.  Pt has a heart score of 3- advised outpatient followup- will likely need a  Repeat stress test- discussed this plan with patient and wife ant bedside.  Discharged with strict return precautions.  Pt agreeable with plan.  2:31 PM pt has had 2 sets of troponins that were both negative, no acute EKG changes and negative d-dimer.  Pt will be discharged for oupatient followup.  Heart score of 3.    Alfonzo Beers, MD 09/26/15 1101

## 2015-09-25 NOTE — Discharge Instructions (Signed)
Return to the ED with any concerns including difficulty breathing, recurring chest pain, fainting, vomiting, decreased level of alertness/lethargy, or any other alarming symptoms

## 2015-12-03 ENCOUNTER — Other Ambulatory Visit: Payer: Self-pay | Admitting: Pulmonary Disease

## 2016-04-29 ENCOUNTER — Encounter (HOSPITAL_COMMUNITY): Payer: Self-pay

## 2016-04-29 ENCOUNTER — Emergency Department (HOSPITAL_COMMUNITY)
Admission: EM | Admit: 2016-04-29 | Discharge: 2016-04-29 | Disposition: A | Discharge status: Home / Self Care | Payer: Medicare PPO | Attending: Emergency Medicine | Admitting: Emergency Medicine

## 2016-04-29 DIAGNOSIS — R310 Gross hematuria: Secondary | ICD-10-CM

## 2016-04-29 DIAGNOSIS — I10 Essential (primary) hypertension: Secondary | ICD-10-CM | POA: Insufficient documentation

## 2016-04-29 DIAGNOSIS — Z7982 Long term (current) use of aspirin: Secondary | ICD-10-CM | POA: Diagnosis not present

## 2016-04-29 DIAGNOSIS — Z8673 Personal history of transient ischemic attack (TIA), and cerebral infarction without residual deficits: Secondary | ICD-10-CM | POA: Diagnosis not present

## 2016-04-29 DIAGNOSIS — Z87891 Personal history of nicotine dependence: Secondary | ICD-10-CM | POA: Insufficient documentation

## 2016-04-29 LAB — URINE MICROSCOPIC-ADD ON

## 2016-04-29 LAB — URINALYSIS, ROUTINE W REFLEX MICROSCOPIC
BILIRUBIN URINE: NEGATIVE
GLUCOSE, UA: NEGATIVE mg/dL
KETONES UR: 15 mg/dL — AB
Nitrite: NEGATIVE
PROTEIN: 100 mg/dL — AB
Specific Gravity, Urine: 1.018 (ref 1.005–1.030)
pH: 6.5 (ref 5.0–8.0)

## 2016-04-29 LAB — I-STAT CHEM 8, ED
BUN: 20 mg/dL (ref 6–20)
CHLORIDE: 104 mmol/L (ref 101–111)
CREATININE: 1.1 mg/dL (ref 0.61–1.24)
Calcium, Ion: 1.28 mmol/L (ref 1.15–1.40)
GLUCOSE: 89 mg/dL (ref 65–99)
HCT: 51 % (ref 39.0–52.0)
Hemoglobin: 17.3 g/dL — ABNORMAL HIGH (ref 13.0–17.0)
POTASSIUM: 4.4 mmol/L (ref 3.5–5.1)
Sodium: 143 mmol/L (ref 135–145)
TCO2: 29 mmol/L (ref 0–100)

## 2016-04-29 NOTE — ED Provider Notes (Signed)
[facility] DEPT Provider Note   CSN: [id number] Arrival date & time: 04/29/16  1015     History   Chief Complaint Chief Complaint  Patient presents with  . Hematuria    HPI Dustin Hamilton is a 75 y.o. male with a past medical history of previous stroke, memory difficulties and history of hematuria. He is on Plavix daily. The patient states that he has been having gross hematuria with small passage of clots over the past week. Patient was seen and had a workup about 5 years ago with Dr. Kellie Simmering [facility], but does not remember the outcome of that workup. The patient was seen earlier this week at the [facility] and started on Cipro. He completed his course yesterday. He continues to have gross hematuria. He denies any frequency, urgency, dysuria, flank pain. He denies suprapubic pain, fevers or chills. The patient also denies shortness of breath, lightheadedness, presyncope, or other symptoms of acute blood loss. He has a long term history of smoking, however, quit almost 30 years ago. His wife states that he has had soaking night sweats but no unexplained weight loss, fevers or loss of appetite. HPI  Past Medical History:  Diagnosis Date  . Abnormal LFTs   . Adenomatous colon polyp 11/2005   TA polyp 04/2012  . Back pain   . BPH (benign prostatic hyperplasia)   . Cerebrovascular disease   . Congenital anomaly of diaphragm   . Hematuria   . Hemorrhoid   . HTN (hypertension)   . Increased prostate specific antigen (PSA) velocity   . Low HDL (under 40)   . Nondependent alcohol abuse, in remission   . Personal history of noncompliance with medical treatment, presenting hazards to health   . Renal cyst   . Right inguinal hernia   . 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 Unspecified intracranial hemorrhage     Patient Active Problem List   Diagnosis Date Noted  . Dizziness 08/02/2014  . 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 CVA (cerebral  infarction) 09/13/2012  . Vertigo 09/12/2012  . EAR PAIN 09/05/2009  . BACK PAIN 06/20/2009  . COLONIC POLYPS 05/06/2007  . 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 NONDEPENDENT ALCOHOL ABUSE IN REMISSION 05/06/2007  . HYPERTENSION, SEVERE 05/06/2007  . Hypertensive heart disease without heart failure 05/06/2007  . INTRACRANIAL HEMORRHAGE 05/06/2007  . Cerebrovascular disease 05/06/2007  . HEMORRHOIDS 05/06/2007  . INGUINAL HERNIA, RIGHT 05/06/2007  . RENAL CYST 05/06/2007  . HEMATURIA UNSPECIFIED 05/06/2007  . 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 PSA, INCREASED 05/06/2007  . LIVER FUNCTION TESTS, ABNORMAL 05/06/2007  . BPH (benign prostatic hypertrophy) with urinary obstruction 05/06/2007  . PERS HX NONCOMPLIANCE W/MED TX PRS HAZARDS HLTH 05/06/2007    Past Surgical History:  Procedure Laterality Date  . cystoscopy and retrograde pyleogram    . [place] &2008   bilateral  . LIVER BIOPSY  1980's       Home Medications    Prior to Admission medications   Medication Sig Start Date End Date Taking? Authorizing Provider  amLODipine (NORVASC) 10 MG tablet TAKE ONE TABLET BY MOUTH EVERY DAY    Noralee Space, MD  aspirin 81 MG tablet Take 81 mg by mouth daily.    Historical Provider, MD  chlorzoxazone (PARAFON) 500 MG tablet Take 1 tablet (500 mg total) by mouth 3 (three) times daily as needed. for muscle spams 08/06/14   Noralee Space, MD  clopidogrel (PLAVIX) 75 MG tablet Take 1 tablet (75 mg total) by mouth daily with breakfast. 05/06/13   Noralee Space, MD  donepezil (ARICEPT) 10 MG tablet Take 1 tablet (10 mg total) by mouth at bedtime. Patient not taking: Reported on 09/25/2015 06/29/14   Pieter Partridge, DO  furosemide (LASIX) 40 MG tablet Take 1 tablet (40 mg total) by mouth daily. 05/03/12   Noralee Space, MD  hydrALAZINE (APRESOLINE) 25 MG tablet TAKE ONE TABLET BY MOUTH TWICE DAILY    Noralee Space, MD  hydrochlorothiazide (MICROZIDE) 12.5 MG  capsule Take 12.5 mg by mouth 2 (two) times daily.    Historical Provider, MD  ibuprofen (ADVIL,MOTRIN) 800 MG tablet Take 800 mg by mouth daily as needed for moderate pain.    Historical Provider, MD  lisinopril (PRINIVIL,ZESTRIL) 20 MG tablet Take 1 tablet (20 mg total) by mouth daily. 05/03/12   Noralee Space, MD  tadalafil (CIALIS) 20 MG tablet Take 1 tablet (20 mg total) by mouth daily as needed for erectile dysfunction. 05/06/13   Noralee Space, MD    Family History Family History  Problem Relation Age of Onset  . Rheum arthritis Mother   . Diabetes Mother   . Stroke Mother   . Heart attack Mother   . Heart attack Father   . Heart disease Maternal Grandmother   . Rheum arthritis Maternal Grandmother   . Diabetes Maternal Grandmother   . Stroke Maternal Grandmother   . Colon cancer Neg Hx     Social History Social History  Substance Use Topics  . Smoking status: Former Smoker    Packs/day: 1.00    Years: 4.00    Types: Cigarettes    Quit date: 11/30/1981  . Smokeless tobacco: Never Used  . Alcohol use No     Comment: quit: Oct 1963     Allergies   Penicillins   Review of Systems Review of Systems  Ten systems reviewed and are negative for acute change, except as noted in the HPI.   Physical Exam Updated Vital Signs BP (!) 144/126   Pulse 85   Temp 97.9 F (36.6 C) (Oral)   Resp 18   Ht 5\' 11"  (1.803 m)   Wt 83 kg   SpO2 94%   BMI 25.52 kg/m   Physical Exam  Constitutional: He appears well-developed and well-nourished. No distress.  HENT:  Head: Normocephalic and atraumatic.  Eyes: Conjunctivae are normal. No scleral icterus.  Neck: Normal range of motion. Neck supple.  Cardiovascular: Normal rate, regular rhythm and normal heart sounds.   Pulmonary/Chest: Effort normal and breath sounds normal. No respiratory distress.  Abdominal: Soft. There is no tenderness.  No CVA tenderness  Musculoskeletal: He exhibits no edema.  Neurological: He is alert.    Skin: Skin is warm and dry. He is not diaphoretic.  Psychiatric: His behavior is normal.  Nursing note and vitals reviewed.    ED Treatments / Results  Labs (all labs ordered are listed, but only abnormal results are displayed) Labs Reviewed  URINALYSIS, ROUTINE W REFLEX MICROSCOPIC (NOT AT [facility]) - Abnormal; Notable for the following:       Result Value   Color, Urine RED (*)    APPearance CLOUDY (*)    Hgb urine dipstick LARGE (*)    Ketones, ur 15 (*)    Protein, ur 100 (*)    Leukocytes, UA LARGE (*)    All other components within normal limits  URINE  MICROSCOPIC-ADD ON - Abnormal; Notable for the following:    Squamous Epithelial / LPF 0-5 (*)    Bacteria, UA RARE (*)    All other components within normal limits  I-STAT CHEM 8, ED - Abnormal; Notable for the following:    Hemoglobin 17.3 (*)    All other components within normal limits    EKG  EKG Interpretation None       Radiology No results found.  Procedures Procedures (including critical care time)  Medications Ordered in ED Medications - No data to display   Initial Impression / Assessment and Plan / ED Course  I have reviewed the triage vital signs and the nursing notes.  Pertinent labs & imaging results that were available during my care of the patient were reviewed by me and considered in my medical decision making (see chart for details).  Clinical Course     Patient with gross hematuria. He is asymptomatic and I do not feel his your needs to be sent for culture. At this time.  Advised patient that he needs follow up with [facility] for workup to rule out possible bladder cancer. Patient expresses his understanding and agrees to follow-up.  Final Clinical Impressions(s) / ED Diagnoses   Final diagnoses:  None    New Prescriptions New Prescriptions   No medications on file     Margarita Mail, PA-C 04/29/16 [place], MD 04/30/16 1942

## 2016-04-29 NOTE — Discharge Instructions (Signed)
Please call [facility] to follow up. Call to make an appointment today. Return to the emergency department if you have worsening heavy bleeding including inability to urinate, passing multiple large clots, feeling weak, short of breath, dizzy or passing out. Return also for any fevers, chills, severe back pain, or any new or worsening symptoms.

## 2016-04-29 NOTE — ED Triage Notes (Signed)
Patient complains of hematuria x 5 days. Denies pain but complains of clots with same. NAD

## 2016-07-23 ENCOUNTER — Emergency Department (HOSPITAL_COMMUNITY)
Admission: EM | Admit: 2016-07-23 | Discharge: 2016-07-23 | Disposition: A | Discharge status: Home / Self Care | Payer: Medicare PPO | Attending: Emergency Medicine | Admitting: Emergency Medicine

## 2016-07-23 ENCOUNTER — Encounter (HOSPITAL_COMMUNITY): Payer: Self-pay | Admitting: *Deleted

## 2016-07-23 DIAGNOSIS — R319 Hematuria, unspecified: Secondary | ICD-10-CM

## 2016-07-23 DIAGNOSIS — Z7982 Long term (current) use of aspirin: Secondary | ICD-10-CM | POA: Insufficient documentation

## 2016-07-23 DIAGNOSIS — Z87891 Personal history of nicotine dependence: Secondary | ICD-10-CM | POA: Insufficient documentation

## 2016-07-23 DIAGNOSIS — Z79899 Other long term (current) drug therapy: Secondary | ICD-10-CM | POA: Diagnosis not present

## 2016-07-23 DIAGNOSIS — Z8673 Personal history of transient ischemic attack (TIA), and cerebral infarction without residual deficits: Secondary | ICD-10-CM | POA: Diagnosis not present

## 2016-07-23 DIAGNOSIS — I10 Essential (primary) hypertension: Secondary | ICD-10-CM | POA: Insufficient documentation

## 2016-07-23 DIAGNOSIS — N390 Urinary tract infection, site not specified: Secondary | ICD-10-CM | POA: Diagnosis not present

## 2016-07-23 DIAGNOSIS — N4889 Other specified disorders of penis: Secondary | ICD-10-CM | POA: Diagnosis present

## 2016-07-23 LAB — CBC WITH DIFFERENTIAL/PLATELET
BASOS ABS: 0 10*3/uL (ref 0.0–0.1)
BASOS PCT: 0 %
EOS ABS: 0.1 10*3/uL (ref 0.0–0.7)
Eosinophils Relative: 2 %
HCT: 46 % (ref 39.0–52.0)
Hemoglobin: 15.8 g/dL (ref 13.0–17.0)
Lymphocytes Relative: 25 %
Lymphs Abs: 1.4 10*3/uL (ref 0.7–4.0)
MCH: 31.2 pg (ref 26.0–34.0)
MCHC: 34.3 g/dL (ref 30.0–36.0)
MCV: 90.9 fL (ref 78.0–100.0)
MONO ABS: 0.3 10*3/uL (ref 0.1–1.0)
MONOS PCT: 6 %
Neutro Abs: 3.9 10*3/uL (ref 1.7–7.7)
Neutrophils Relative %: 67 %
PLATELETS: 170 10*3/uL (ref 150–400)
RBC: 5.06 MIL/uL (ref 4.22–5.81)
RDW: 14 % (ref 11.5–15.5)
WBC: 5.8 10*3/uL (ref 4.0–10.5)

## 2016-07-23 LAB — URINALYSIS, MICROSCOPIC (REFLEX): SQUAMOUS EPITHELIAL / LPF: NONE SEEN

## 2016-07-23 LAB — URINALYSIS, ROUTINE W REFLEX MICROSCOPIC

## 2016-07-23 LAB — BASIC METABOLIC PANEL
Anion gap: 7 (ref 5–15)
BUN: 9 mg/dL (ref 6–20)
CALCIUM: 9.7 mg/dL (ref 8.9–10.3)
CO2: 29 mmol/L (ref 22–32)
Chloride: 104 mmol/L (ref 101–111)
Creatinine, Ser: 0.86 mg/dL (ref 0.61–1.24)
Glucose, Bld: 96 mg/dL (ref 65–99)
Potassium: 3.8 mmol/L (ref 3.5–5.1)
SODIUM: 140 mmol/L (ref 135–145)

## 2016-07-23 LAB — PROTIME-INR
INR: 1.09
PROTHROMBIN TIME: 14.2 s (ref 11.4–15.2)

## 2016-07-23 MED ORDER — LEVOFLOXACIN 750 MG PO TABS
750.0000 mg | ORAL_TABLET | Freq: Once | ORAL | Status: AC
  Administered 2016-07-23: 750 mg via ORAL
  Filled 2016-07-23: qty 1

## 2016-07-23 MED ORDER — LEVOFLOXACIN 750 MG PO TABS: 750.0000 mg | ORAL_TABLET | Freq: Every day | ORAL | 0 refills | Status: AC

## 2016-07-23 NOTE — ED Provider Notes (Signed)
[facility] DEPT Provider Note   CSN: [id number] Arrival date & time: 07/23/16  [id number]     History   Chief Complaint Chief Complaint  Patient presents with  . Penis Pain    HPI Dustin Hamilton is a 76 y.o. male with a past medical history significant for BPH, stroke on aspirin and Plavix, hypertension, and history of hematuria who presents with hematuria. Patient reports that for the last 10 days, he has had gross hematuria with blood clots. He reports that he called his [place] PCP who told him to come to the ED for evaluation. Patient says that several months ago he had similar symptoms prompting evaluation by urology. The [place] urologist reportedly told the patient that he did not have evidence of cancer and that he needs to follow-up with a kidney doctor for further management. He says that he also treated for urinary tract infection with prior hematuria. Patient denies any dysuria, fevers, chills, nausea, vomiting, conservation, diarrhea, or URI symptoms he denies any lightheadedness, chest pain, palpitations, or fatigue. Patient's only symptom is the hematuria.      HPI  Past Medical History:  Diagnosis Date  . Abnormal LFTs   . Adenomatous colon polyp 11/2005   TA polyp 04/2012  . Back pain   . BPH (benign prostatic hyperplasia)   . Cerebrovascular disease   . Congenital anomaly of diaphragm   . Hematuria   . Hemorrhoid   . HTN (hypertension)   . Increased prostate specific antigen (PSA) velocity   . Low HDL (under 40)   . Nondependent alcohol abuse, in remission   . Personal history of noncompliance with medical treatment, presenting hazards to health   . Renal cyst   . Right inguinal hernia   . 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 Unspecified intracranial hemorrhage     Patient Active Problem List   Diagnosis Date Noted  . Dizziness 08/02/2014  . 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 CVA (cerebral infarction) 09/13/2012  . Vertigo  09/12/2012  . EAR PAIN 09/05/2009  . BACK PAIN 06/20/2009  . COLONIC POLYPS 05/06/2007  . 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 NONDEPENDENT ALCOHOL ABUSE IN REMISSION 05/06/2007  . HYPERTENSION, SEVERE 05/06/2007  . Hypertensive heart disease without heart failure 05/06/2007  . INTRACRANIAL HEMORRHAGE 05/06/2007  . Cerebrovascular disease 05/06/2007  . HEMORRHOIDS 05/06/2007  . INGUINAL HERNIA, RIGHT 05/06/2007  . RENAL CYST 05/06/2007  . HEMATURIA UNSPECIFIED 05/06/2007  . 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 PSA, INCREASED 05/06/2007  . LIVER FUNCTION TESTS, ABNORMAL 05/06/2007  . BPH (benign prostatic hypertrophy) with urinary obstruction 05/06/2007  . PERS HX NONCOMPLIANCE W/MED TX PRS HAZARDS HLTH 05/06/2007    Past Surgical History:  Procedure Laterality Date  . cystoscopy and retrograde pyleogram    . [place] &2008   bilateral  . LIVER BIOPSY  1980's       Home Medications    Prior to Admission medications   Medication Sig Start Date End Date Taking? Authorizing Provider  amLODipine (NORVASC) 10 MG tablet TAKE ONE TABLET BY MOUTH EVERY DAY    Noralee Space, MD  aspirin 81 MG tablet Take 81 mg by mouth daily.    Historical Provider, MD  chlorzoxazone (PARAFON) 500 MG tablet Take 1 tablet (500 mg total) by mouth 3 (three) times daily as needed. for muscle spams 08/06/14   Noralee Space, MD  clopidogrel (PLAVIX) 75 MG tablet Take 1 tablet (  75 mg total) by mouth daily with breakfast. 05/06/13   Noralee Space, MD  donepezil (ARICEPT) 10 MG tablet Take 1 tablet (10 mg total) by mouth at bedtime. Patient not taking: Reported on 09/25/2015 06/29/14   Pieter Partridge, DO  furosemide (LASIX) 40 MG tablet Take 1 tablet (40 mg total) by mouth daily. 05/03/12   Noralee Space, MD  hydrALAZINE (APRESOLINE) 25 MG tablet TAKE ONE TABLET BY MOUTH TWICE DAILY    Noralee Space, MD  hydrochlorothiazide (MICROZIDE) 12.5 MG capsule Take 12.5 mg by mouth 2  (two) times daily.    Historical Provider, MD  ibuprofen (ADVIL,MOTRIN) 800 MG tablet Take 800 mg by mouth daily as needed for moderate pain.    Historical Provider, MD  lisinopril (PRINIVIL,ZESTRIL) 20 MG tablet Take 1 tablet (20 mg total) by mouth daily. 05/03/12   Noralee Space, MD  tadalafil (CIALIS) 20 MG tablet Take 1 tablet (20 mg total) by mouth daily as needed for erectile dysfunction. 05/06/13   Noralee Space, MD    Family History Family History  Problem Relation Age of Onset  . Rheum arthritis Mother   . Diabetes Mother   . Stroke Mother   . Heart attack Mother   . Heart attack Father   . Heart disease Maternal Grandmother   . Rheum arthritis Maternal Grandmother   . Diabetes Maternal Grandmother   . Stroke Maternal Grandmother   . Colon cancer Neg Hx     Social History Social History  Substance Use Topics  . Smoking status: Former Smoker    Packs/day: 1.00    Years: 4.00    Types: Cigarettes    Quit date: 11/30/1981  . Smokeless tobacco: Never Used  . Alcohol use No     Comment: quit: Oct 1963     Allergies   Penicillins   Review of Systems Review of Systems  Constitutional: Negative for activity change, chills, diaphoresis, fatigue and fever.  HENT: Negative for congestion and rhinorrhea.   Eyes: Negative for visual disturbance.  Respiratory: Negative for cough, chest tightness, shortness of breath, wheezing and stridor.   Cardiovascular: Negative for chest pain, palpitations and leg swelling.  Gastrointestinal: Negative for abdominal distention, abdominal pain, blood in stool, constipation, diarrhea, nausea and vomiting.  Genitourinary: Positive for hematuria. Negative for decreased urine volume, difficulty urinating, dysuria, flank pain, frequency, penile swelling, scrotal swelling, testicular pain and urgency.  Musculoskeletal: Negative for back pain and gait problem.  Skin: Negative for rash and wound.  Neurological: Negative for dizziness, weakness,  light-headedness and headaches.  Psychiatric/Behavioral: Negative for agitation.  All other systems reviewed and are negative.    Physical Exam Updated Vital Signs BP 142/91   Pulse 68   Temp 98 F (36.7 C) (Oral)   Resp 16   SpO2 98%   Physical Exam  Constitutional: He is oriented to person, place, and time. He appears well-developed and well-nourished. No distress.  HENT:  Head: Normocephalic and atraumatic.  Right Ear: External ear normal.  Left Ear: External ear normal.  Nose: Nose normal.  Mouth/Throat: Oropharynx is clear and moist. No oropharyngeal exudate.  Eyes: Conjunctivae and EOM are normal. Pupils are equal, round, and reactive to light.  Neck: Normal range of motion. Neck supple.  Pulmonary/Chest: No stridor. No respiratory distress.  Abdominal: Soft. There is no tenderness. There is no rebound and no guarding. Hernia confirmed negative in the right inguinal area and confirmed negative in the left inguinal area.  Genitourinary: Right testis shows no tenderness. Left testis shows no tenderness. No penile erythema or penile tenderness.     Musculoskeletal: He exhibits no edema.  Neurological: He is alert and oriented to person, place, and time. He displays normal reflexes. No cranial nerve deficit. He exhibits normal muscle tone. Coordination normal.  Skin: Skin is warm. No rash noted. He is not diaphoretic. No erythema.     ED Treatments / Results  Labs (all labs ordered are listed, but only abnormal results are displayed) Labs Reviewed  URINALYSIS, ROUTINE W REFLEX MICROSCOPIC - Abnormal; Notable for the following:       Result Value   Color, Urine RED (*)    APPearance TURBID (*)    Glucose, UA   (*)    Value: TEST NOT REPORTED DUE TO COLOR INTERFERENCE OF URINE PIGMENT   Hgb urine dipstick   (*)    Value: TEST NOT REPORTED DUE TO COLOR INTERFERENCE OF URINE PIGMENT   Bilirubin Urine   (*)    Value: TEST NOT REPORTED DUE TO COLOR INTERFERENCE OF URINE  PIGMENT   Ketones, ur   (*)    Value: TEST NOT REPORTED DUE TO COLOR INTERFERENCE OF URINE PIGMENT   Protein, ur   (*)    Value: TEST NOT REPORTED DUE TO COLOR INTERFERENCE OF URINE PIGMENT   Nitrite   (*)    Value: TEST NOT REPORTED DUE TO COLOR INTERFERENCE OF URINE PIGMENT   Leukocytes, UA   (*)    Value: TEST NOT REPORTED DUE TO COLOR INTERFERENCE OF URINE PIGMENT   All other components within normal limits  URINALYSIS, MICROSCOPIC (REFLEX) - Abnormal; Notable for the following:    Bacteria, UA RARE (*)    All other components within normal limits  URINE CULTURE  CBC WITH DIFFERENTIAL/PLATELET  BASIC METABOLIC PANEL  PROTIME-INR    EKG  EKG Interpretation None       Radiology No results found.  Procedures Procedures (including critical care time)  Medications Ordered in ED Medications  levofloxacin (LEVAQUIN) tablet 750 mg (750 mg Oral Given 07/23/16 1528)     Initial Impression / Assessment and Plan / ED Course  I have reviewed the triage vital signs and the nursing notes.  Pertinent labs & imaging results that were available during my care of the patient were reviewed by me and considered in my medical decision making (see chart for details).     Dustin Hamilton is a 76 y.o. male with a past medical history significant for BPH, stroke on aspirin and Plavix, hypertension, and history of hematuria who presents with hematuria.   On exam, patient in gross blood at the meatus. No penile tenderness, scrotal tenderness, or groin tenderness. No abdominal tenderness. Exam otherwise unremarkable.  Given patient's similar hematuria with urinary tract infection, patient had urinalysis and culture sent. Laboratory testing performed to look for anemia or kidney dysfunction. Laboratory testing grossly unremarkable and urinalysis shows evidence of bacteria. Due to amount of blood, other nitrites and leukocyte testing was not reported due to color interference. However with  the bacteria and no squamous cell seen, patient will be treated for UTI. Kidney function improved from prior. No evidence of worsening anemia.  Given well appearance in the ED, patient felt appropriate for discharge. Patient given dose of levofloxacin here and will be given prescription for several days. Patient will follow-up with his urologist at the [facility] as well as his PCP. Patient given strict return precautions for any  signs and symptoms of new or worsening infection. Patient had no other questions or concerns and patient was discharged in good condition.     Final Clinical Impressions(s) / ED Diagnoses   Final diagnoses:  Lower urinary tract infectious disease  Hematuria, unspecified type    New Prescriptions Discharge Medication List as of 07/23/2016  3:19 PM    START taking these medications   Details  levofloxacin (LEVAQUIN) 750 MG tablet Take 1 tablet (750 mg total) by mouth daily., Starting Thu 07/24/2016, Until Mon 07/28/2016, Print        Clinical Impression: 1. Lower urinary tract infectious disease   2. Hematuria, unspecified type     Disposition: Discharge  Condition: Good  I have discussed the results, Dx and Tx plan with the pt(& family if present). He/she/they expressed understanding and agree(s) with the plan. Discharge instructions discussed at great length. Strict return precautions discussed and pt &/or family have verbalized understanding of the instructions. No further questions at time of discharge.    Discharge Medication List as of 07/23/2016  3:19 PM    START taking these medications   Details  levofloxacin (LEVAQUIN) 750 MG tablet Take 1 tablet (750 mg total) by mouth daily., Starting Thu 07/24/2016, Until Mon 07/28/2016, Print        Follow Up: [facility] [address] [id number] mc [place] [address]    [place] urologist   Please follow up with your Urologist at the  [place].     Courtney Paris, MD 07/23/16 2006

## 2016-07-23 NOTE — Discharge Instructions (Signed)
Please follow-up with your urologist at the [facility] for further management of your urinary tract infection in your bleeding. Please take your antibiotics as prescribed. If symptoms worsen or you develop new symptoms, please return to the nearest emergency department.

## 2016-07-23 NOTE — ED Triage Notes (Signed)
Pt reports bleeding from his penis when he urinates. Pt states that he has hx of prostate problems. Pt states that he has had testing recently for cancer with negative results. Pt states that he was told to follow up with a urologist for kidney testing. Pt states that this occurrence started 10 days ago.

## 2016-07-24 LAB — URINE CULTURE: Culture: <10000 — AB

## 2016-08-04 ENCOUNTER — Other Ambulatory Visit: Payer: Self-pay | Admitting: Urology

## 2016-08-05 ENCOUNTER — Encounter (HOSPITAL_BASED_OUTPATIENT_CLINIC_OR_DEPARTMENT_OTHER): Payer: Self-pay | Admitting: *Deleted

## 2016-08-05 ENCOUNTER — Other Ambulatory Visit: Payer: Self-pay | Admitting: Urology

## 2016-08-05 NOTE — Progress Notes (Signed)
NPO AFTER MN WITH EXCEPTION CLEAR LIQUIDS UNTIL 0800 (NO CREAM [place] PRODUCTS).  ARRIVE AT 1145.  CURRENT LAB RESULTS AND EKG IN CHART AND EPIC.  WILL TAKE HYDRALAZINE AND COREG AM DOS W/ SIPS OF WATER.

## 2016-08-11 ENCOUNTER — Encounter (HOSPITAL_BASED_OUTPATIENT_CLINIC_OR_DEPARTMENT_OTHER): Payer: Self-pay | Admitting: *Deleted

## 2016-08-11 ENCOUNTER — Ambulatory Visit (HOSPITAL_BASED_OUTPATIENT_CLINIC_OR_DEPARTMENT_OTHER): Payer: Medicare PPO | Admitting: Anesthesiology

## 2016-08-11 ENCOUNTER — Encounter (HOSPITAL_BASED_OUTPATIENT_CLINIC_OR_DEPARTMENT_OTHER)
Admission: RE | Disposition: A | Discharge status: Home / Self Care | Payer: Self-pay | Source: Ambulatory Visit | Attending: Urology

## 2016-08-11 ENCOUNTER — Ambulatory Visit (HOSPITAL_BASED_OUTPATIENT_CLINIC_OR_DEPARTMENT_OTHER)
Admission: RE | Admit: 2016-08-11 | Discharge: 2016-08-11 | Disposition: A | Discharge status: Home / Self Care | Payer: Medicare PPO | Source: Ambulatory Visit | Attending: Urology | Admitting: Urology

## 2016-08-11 DIAGNOSIS — Z87891 Personal history of nicotine dependence: Secondary | ICD-10-CM | POA: Diagnosis not present

## 2016-08-11 DIAGNOSIS — I69311 Memory deficit following cerebral infarction: Secondary | ICD-10-CM | POA: Diagnosis not present

## 2016-08-11 DIAGNOSIS — Z7902 Long term (current) use of antithrombotics/antiplatelets: Secondary | ICD-10-CM | POA: Insufficient documentation

## 2016-08-11 DIAGNOSIS — Z8603 Personal history of neoplasm of uncertain behavior: Secondary | ICD-10-CM | POA: Insufficient documentation

## 2016-08-11 DIAGNOSIS — I10 Essential (primary) hypertension: Secondary | ICD-10-CM | POA: Insufficient documentation

## 2016-08-11 DIAGNOSIS — N398 Other specified disorders of urinary system: Secondary | ICD-10-CM | POA: Insufficient documentation

## 2016-08-11 DIAGNOSIS — Z79899 Other long term (current) drug therapy: Secondary | ICD-10-CM | POA: Diagnosis not present

## 2016-08-11 DIAGNOSIS — N3289 Other specified disorders of bladder: Secondary | ICD-10-CM | POA: Insufficient documentation

## 2016-08-11 DIAGNOSIS — Z7982 Long term (current) use of aspirin: Secondary | ICD-10-CM | POA: Diagnosis not present

## 2016-08-11 DIAGNOSIS — N362 Urethral caruncle: Secondary | ICD-10-CM | POA: Insufficient documentation

## 2016-08-11 DIAGNOSIS — R310 Gross hematuria: Secondary | ICD-10-CM | POA: Diagnosis present

## 2016-08-11 HISTORY — PX: TRANSURETHRAL RESECTION OF BLADDER TUMOR: SHX2575

## 2016-08-11 HISTORY — DX: Unspecified glaucoma: H40.9

## 2016-08-11 HISTORY — DX: Other psychoactive substance abuse, in remission: F19.11

## 2016-08-11 HISTORY — DX: Personal history of other diseases of the digestive system: Z87.19

## 2016-08-11 HISTORY — DX: Personal history of other diseases of the circulatory system: Z86.79

## 2016-08-11 HISTORY — DX: Personal history of adenomatous and serrated colon polyps: Z86.0101

## 2016-08-11 HISTORY — DX: Other specified abnormal immunological findings in serum: R76.8

## 2016-08-11 HISTORY — DX: Cyst of kidney, acquired: N28.1

## 2016-08-11 HISTORY — DX: Urethral disorder, unspecified: N36.9

## 2016-08-11 HISTORY — DX: Elevated prostate specific antigen (PSA): R97.20

## 2016-08-11 HISTORY — DX: Alcohol abuse, in remission: F10.11

## 2016-08-11 HISTORY — DX: Personal history of colonic polyps: Z86.010

## 2016-08-11 HISTORY — DX: Personal history of transient ischemic attack (TIA), and cerebral infarction without residual deficits: Z86.73

## 2016-08-11 SURGERY — TURBT (TRANSURETHRAL RESECTION OF BLADDER TUMOR)
Anesthesia: General | Site: Bladder

## 2016-08-11 MED ORDER — DEXAMETHASONE SODIUM PHOSPHATE 10 MG/ML IJ SOLN
INTRAMUSCULAR | Status: AC
  Filled 2016-08-11: qty 1

## 2016-08-11 MED ORDER — PHENYLEPHRINE HCL 10 MG/ML IJ SOLN
INTRAMUSCULAR | Status: DC | PRN
  Administered 2016-08-11: 40 ug via INTRAVENOUS

## 2016-08-11 MED ORDER — DEXTROSE 5 % IV SOLN
INTRAVENOUS | Status: AC
  Filled 2016-08-11: qty 50

## 2016-08-11 MED ORDER — CEFTRIAXONE SODIUM 2 G IJ SOLR
INTRAMUSCULAR | Status: AC
  Filled 2016-08-11: qty 2

## 2016-08-11 MED ORDER — MIDAZOLAM HCL 2 MG/2ML IJ SOLN
0.5000 mg | Freq: Once | INTRAMUSCULAR | Status: DC | PRN
  Filled 2016-08-11: qty 2

## 2016-08-11 MED ORDER — PROMETHAZINE HCL 25 MG/ML IJ SOLN
6.2500 mg | INTRAMUSCULAR | Status: DC | PRN
  Filled 2016-08-11: qty 1

## 2016-08-11 MED ORDER — ONDANSETRON HCL 4 MG/2ML IJ SOLN
INTRAMUSCULAR | Status: AC
  Filled 2016-08-11: qty 2

## 2016-08-11 MED ORDER — LACTATED RINGERS IV SOLN
INTRAVENOUS | Status: DC
  Administered 2016-08-11: 12:00:00 via INTRAVENOUS
  Filled 2016-08-11: qty 1000

## 2016-08-11 MED ORDER — LIDOCAINE 2% (20 MG/ML) 5 ML SYRINGE
INTRAMUSCULAR | Status: AC
  Filled 2016-08-11: qty 5

## 2016-08-11 MED ORDER — FENTANYL CITRATE (PF) 100 MCG/2ML IJ SOLN
25.0000 ug | INTRAMUSCULAR | Status: DC | PRN
  Filled 2016-08-11: qty 1

## 2016-08-11 MED ORDER — LIDOCAINE HCL (CARDIAC) 20 MG/ML IV SOLN
INTRAVENOUS | Status: DC | PRN
  Administered 2016-08-11: 40 mg via INTRAVENOUS

## 2016-08-11 MED ORDER — ONDANSETRON HCL 4 MG/2ML IJ SOLN
INTRAMUSCULAR | Status: DC | PRN
  Administered 2016-08-11: 4 mg via INTRAVENOUS

## 2016-08-11 MED ORDER — FENTANYL CITRATE (PF) 100 MCG/2ML IJ SOLN
INTRAMUSCULAR | Status: AC
  Filled 2016-08-11: qty 2

## 2016-08-11 MED ORDER — MEPERIDINE HCL 25 MG/ML IJ SOLN
6.2500 mg | INTRAMUSCULAR | Status: DC | PRN
  Filled 2016-08-11: qty 1

## 2016-08-11 MED ORDER — PROPOFOL 10 MG/ML IV BOLUS
INTRAVENOUS | Status: AC
  Filled 2016-08-11: qty 20

## 2016-08-11 MED ORDER — DEXTROSE 5 % IV SOLN
2.0000 g | Freq: Once | INTRAVENOUS | Status: AC
  Administered 2016-08-11: 2 g via INTRAVENOUS
  Filled 2016-08-11: qty 2

## 2016-08-11 MED ORDER — PROPOFOL 10 MG/ML IV BOLUS
INTRAVENOUS | Status: DC | PRN
  Administered 2016-08-11 (×2): 100 mg via INTRAVENOUS

## 2016-08-11 MED ORDER — TRAMADOL HCL 50 MG PO TABS: 50.0000 mg | ORAL_TABLET | Freq: Four times a day (QID) | ORAL | 0 refills | Status: DC | PRN

## 2016-08-11 MED ORDER — DEXAMETHASONE SODIUM PHOSPHATE 4 MG/ML IJ SOLN
INTRAMUSCULAR | Status: DC | PRN
  Administered 2016-08-11: 10 mg via INTRAVENOUS

## 2016-08-11 MED ORDER — CARVEDILOL 25 MG PO TABS
25.0000 mg | ORAL_TABLET | Freq: Two times a day (BID) | ORAL | Status: DC
  Administered 2016-08-11: 25 mg via ORAL
  Filled 2016-08-11 (×2): qty 1

## 2016-08-11 MED ORDER — SODIUM CHLORIDE 0.9 % IR SOLN
Status: DC | PRN
  Administered 2016-08-11: 3000 mL

## 2016-08-11 MED ORDER — FENTANYL CITRATE (PF) 100 MCG/2ML IJ SOLN
INTRAMUSCULAR | Status: DC | PRN
Start: 2016-08-11 — End: 2016-08-11
  Administered 2016-08-11 (×4): 25 ug via INTRAVENOUS

## 2016-08-11 SURGICAL SUPPLY — 36 items
BAG DRAIN URO-CYSTO SKYTR STRL (DRAIN) ×4 IMPLANT
BAG DRN ANRFLXCHMBR STRAP LEK (BAG)
BAG DRN UROCATH (DRAIN) ×2
BAG URINE DRAINAGE (UROLOGICAL SUPPLIES) ×2 IMPLANT
BAG URINE LEG 19OZ MD ST LTX (BAG) IMPLANT
BAG URINE LEG 500ML (DRAIN) IMPLANT
BASKET ZERO TIP NITINOL 2.4FR (BASKET) IMPLANT
BSKT STON RTRVL ZERO TP 2.4FR (BASKET)
CATH FOLEY 2WAY SLVR  5CC 22FR (CATHETERS)
CATH FOLEY 2WAY SLVR 30CC 20FR (CATHETERS) IMPLANT
CATH FOLEY 2WAY SLVR 30CC 22FR (CATHETERS) IMPLANT
CATH FOLEY 2WAY SLVR 5CC 22FR (CATHETERS) IMPLANT
CATH FOLEY 3WAY 30CC 22F (CATHETERS) ×2 IMPLANT
CATH INTERMIT  6FR 70CM (CATHETERS) IMPLANT
CLOTH BEACON ORANGE TIMEOUT ST (SAFETY) ×3 IMPLANT
ELECT REM PT RETURN 9FT ADLT (ELECTROSURGICAL) ×3
ELECTRODE REM PT RTRN 9FT ADLT (ELECTROSURGICAL) ×2 IMPLANT
EVACUATOR MICROVAS BLADDER (UROLOGICAL SUPPLIES) IMPLANT
GLOVE BIO SURGEON STRL SZ8 (GLOVE) ×3 IMPLANT
GOWN STRL REUS W/ TWL LRG LVL3 (GOWN DISPOSABLE) ×4 IMPLANT
GOWN STRL REUS W/ TWL XL LVL3 (GOWN DISPOSABLE) IMPLANT
GOWN STRL REUS W/TWL LRG LVL3 (GOWN DISPOSABLE) ×6
GOWN STRL REUS W/TWL XL LVL3 (GOWN DISPOSABLE)
GUIDEWIRE ANG ZIPWIRE 038X150 (WIRE) IMPLANT
GUIDEWIRE STR DUAL SENSOR (WIRE) ×3 IMPLANT
IV NS IRRIG 3000ML ARTHROMATIC (IV SOLUTION) ×3 IMPLANT
KIT RM TURNOVER CYSTO AR (KITS) ×3 IMPLANT
LOOP CUT BIPOLAR 24F LRG (ELECTROSURGICAL) ×2 IMPLANT
MANIFOLD NEPTUNE II (INSTRUMENTS) ×2 IMPLANT
PACK CYSTO (CUSTOM PROCEDURE TRAY) ×3 IMPLANT
PLUG CATH AND CAP STER (CATHETERS) ×3 IMPLANT
SET ASPIRATION TUBING (TUBING) IMPLANT
SYR 20CC LL (SYRINGE) IMPLANT
SYRINGE 20CC LL (MISCELLANEOUS) ×2 IMPLANT
SYRINGE IRR TOOMEY STRL 70CC (SYRINGE) IMPLANT
TUBE CONNECTING 12X1/4 (SUCTIONS) ×2 IMPLANT

## 2016-08-11 NOTE — Anesthesia Procedure Notes (Signed)
Procedure Name: LMA Insertion Date/Time: 08/11/2016 1:30 PM Performed by: Justice Rocher Pre-anesthesia Checklist: Patient identified, Emergency Drugs available, Suction available and Patient being monitored Patient Re-evaluated:Patient Re-evaluated prior to inductionOxygen Delivery Method: Circle system utilized Preoxygenation: Pre-oxygenation with 100% oxygen Intubation Type: IV induction Ventilation: Mask ventilation without difficulty LMA: LMA inserted LMA Size: 5.0 Number of attempts: 1 Airway Equipment and Method: Bite block Placement Confirmation: positive ETCO2 and breath sounds checked- equal and bilateral Tube secured with: Tape Dental Injury: Teeth and Oropharynx as per pre-operative assessment  Comments: Poor dentition, noted chip on front upper incisor pt's right tooth, plaque, LMA placed teeth as preop

## 2016-08-11 NOTE — H&P (Signed)
Urology Admission H&P  Chief Complaint: Gross hematuria  History of Present Illness: Dustin Hamilton is a 76yo with a bladder tumor found on office cystoscopy  Past Medical History:  Diagnosis Date  . BPH (benign prostatic hyperplasia)   . Cerebrovascular disease   . Congenital anomaly of diaphragm   . Elevated PSA   . Glaucoma, both eyes   . Hemorrhoid   . Hepatitis B surface antigen positive    02-20-2011  . History of adenomatous polyp of colon    2007, 2009 and 2013  tubular adenoma's  . History of alcohol abuse    quit 1963  . History of cerebral parenchymal hemorrhage    01/ 2006  left occiptial lobe related to hypertensive crisis  . History of CVA (cerebrovascular accident)    09-12-2012  left hippocampus/ amygdala junction and per MRI old white matter infarcts--  per pt residual short- term memory issues  . History of fatty infiltration of liver hx visit's at [facility] , last visit 05/ 2014   elvated LFT's ,  via liver bx 2004 related to hx alcohol and drug abuse (quit 1964)  . 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 HTN (hypertension)   . Renal cyst, left   . 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 Urethral lesion    urethral mass   Past Surgical History:  Procedure Laterality Date  . CARDIOVASCULAR STRESS TEST  05/05/2007   normal nuclear study w/ no ischemia/  normal LV fucntion and wall motion , ef60%  . COLONOSCOPY  last one 04-06-2012  . CYSTO/  LEFT RETROGRADE PYELOGRAM/ CYTOLOGY WASHINGS/  URETEROSCOPY  03/05/2000  . INGUINAL HERNIA REPAIR Bilateral 1965 and 1980's  . LAPAROSCOPIC INGUINAL HERNIA WITH UMBILICAL HERNIA Right 59/93/5701  . LIVER BIOPSY  1980's and 2004  . TRANSTHORACIC ECHOCARDIOGRAM  09/13/2012   moderate LVH,  ef 60-65%/      Home Medications:  Prescriptions Prior to Admission  Medication Sig Dispense Refill Last Dose  . amLODipine (NORVASC) 10 MG tablet TAKE ONE TABLET BY  MOUTH EVERY DAY (Patient taking differently: TAKE ONE TABLET BY MOUTH EVERY DAY-- takes in pm) 90 tablet 0 08/10/2016 at 0800  . aspirin 81 MG tablet Take 81 mg by mouth daily.   Past Month at Unknown time  . carvedilol (COREG) 25 MG tablet Take 12.5 mg by mouth 2 (two) times daily with a meal.   08/10/2016 at 0800  . clopidogrel (PLAVIX) 75 MG tablet Take 1 tablet (75 mg total) by mouth daily with breakfast. 30 tablet 11 Past Month at Unknown time  . furosemide (LASIX) 40 MG tablet Take 1 tablet (40 mg total) by mouth daily. (Patient taking differently: Take 40 mg by mouth every morning. ) 90 tablet 3 08/10/2016 at Unknown time  . hydrALAZINE (APRESOLINE) 25 MG tablet TAKE ONE TABLET BY MOUTH TWICE DAILY (Patient taking differently: TAKE 25 mg TABLET BY MOUTH TWICE DAILY) 180 tablet 0 Past Week at Unknown time  . hydrochlorothiazide (MICROZIDE) 12.5 MG capsule Take 12.5 mg by mouth 2 (two) times daily.   08/10/2016 at Unknown time  . latanoprost (XALATAN) 0.005 % ophthalmic solution Place 1 drop into both eyes at bedtime.    08/08/2016 at Unknown time  . tamsulosin (FLOMAX) 0.4 MG CAPS capsule Take 0.8 mg by mouth at bedtime.   08/10/2016 at Unknown time  . diclofenac (VOLTAREN) 75 MG EC tablet  Take 75 mg by mouth 2 (two) times daily as needed for moderate pain.   More than a month at Unknown time  . ibuprofen (ADVIL,MOTRIN) 200 MG tablet Take 200 mg by mouth every 6 (six) hours as needed.   More than a month at Unknown time  . tadalafil (CIALIS) 20 MG tablet Take 1 tablet (20 mg total) by mouth daily as needed for erectile dysfunction. 10 tablet 6 More than a month at Unknown time   Allergies:  Allergies  Allergen Reactions  . Penicillins Hives    Has patient had a PCN reaction causing immediate rash, facial/tongue/throat swelling, SOB or lightheadedness with hypotension: Yes Has patient had a PCN reaction causing severe rash involving mucus membranes or skin necrosis: Yes Has patient had a PCN  reaction that required hospitalization No Has patient had a PCN reaction occurring within the last 10 years: No If all of the above answers are "NO", then may proceed with Cephalosporin use.     Family History  Problem Relation Age of Onset  . Rheum arthritis Mother   . Diabetes Mother   . Stroke Mother   . Heart attack Mother   . Heart attack Father   . Heart disease Maternal Grandmother   . Rheum arthritis Maternal Grandmother   . Diabetes Maternal Grandmother   . Stroke Maternal Grandmother   . Colon cancer Neg Hx    Social History:  reports that he quit smoking about 34 years ago. His smoking use included Cigarettes. He has a 5.00 pack-year smoking history. He has never used smokeless tobacco. He reports that he does not drink alcohol or use drugs.  Review of Systems  All other systems reviewed and are negative.   Physical Exam:  Vital signs in last 24 hours: Temp:  [97.7 F (36.5 C)] 97.7 F (36.5 C) (03/12 1124) Pulse Rate:  [79] 79 (03/12 1124) Resp:  [16] 16 (03/12 1124) BP: (165)/(94) 165/94 (03/12 1124) SpO2:  [97 %] 97 % (03/12 1124) Weight:  [79.8 kg (176 lb)] 79.8 kg (176 lb) (03/12 1124) Physical Exam  Constitutional: He is oriented to person, place, and time. He appears well-developed and well-nourished.  HENT:  Head: Normocephalic and atraumatic.  Eyes: EOM are normal. Pupils are equal, round, and reactive to light.  Neck: Normal range of motion. No thyromegaly present.  Cardiovascular: Normal rate and regular rhythm.   Respiratory: Effort normal. No respiratory distress.  GI: Soft. He exhibits no distension.  Musculoskeletal: Normal range of motion. He exhibits no edema.  Neurological: He is alert and oriented to person, place, and time.  Skin: Skin is warm and dry.  Psychiatric: He has a normal mood and affect. His behavior is normal. Judgment and thought content normal.    Laboratory Data:  No results found for this or any previous visit (from the  past 24 hour(s)). No results found for this or any previous visit (from the past 240 hour(s)). Creatinine: No results for input(s): CREATININE in the last 168 hours. Baseline Creatinine: unknwon  Impression/Assessment:  75yo with a bladder tumor  Plan:  The risks/benefits/alternatives to TURBT was explained to the patient and he understands and wishes to proceed with surgery  Nicolette Bang 08/11/2016, 1:09 PM

## 2016-08-11 NOTE — Discharge Instructions (Signed)
Indwelling Urinary Catheter Care, Adult °Take good care of your catheter to keep it working and to prevent problems. °How to wear your catheter °Attach your catheter to your leg with tape (adhesive tape) or a leg strap. Make sure it is not too tight. If you use tape, remove any bits of tape that are already on the catheter. °How to wear a drainage bag °You should have: °· A large overnight bag. °· A small leg bag. °Overnight Bag  °You may wear the overnight bag at any time. Always keep the bag below the level of your bladder but off the floor. When you sleep, put a clean plastic bag in a wastebasket. Then hang the bag inside the wastebasket. °Leg Bag  °Never wear the leg bag at night. Always wear the leg bag below your knee. Keep the leg bag secure with a leg strap or tape. °How to care for your skin °· Clean the skin around the catheter at least once every day. °· Shower every day. Do not take baths. °· Put creams, lotions, or ointments on your genital area only as told by your doctor. °· Do not use powders, sprays, or lotions on your genital area. °How to clean your catheter and your skin °1. Wash your hands with soap and water. °2. Wet a washcloth in warm water and gentle (mild) soap. °3. Use the washcloth to clean the skin where the catheter enters your body. Clean downward and wipe away from the catheter in small circles. Do not wipe toward the catheter. °4. Pat the area dry with a clean towel. Make sure to clean off all soap. °How to care for your drainage bags °Empty your drainage bag when it is ?-½ full or at least 2-3 times a day. Replace your drainage bag once a month or sooner if it starts to smell bad or look dirty. Do not clean your drainage bag unless told by your doctor. °Emptying a drainage bag  ° °Supplies Needed °· Rubbing alcohol. °· Gauze pad or cotton ball. °· Tape or a leg strap. °Steps °1. Wash your hands with soap and water. °2. Separate (detach) the bag from your leg. °3. Hold the bag over  the toilet or a clean container. Keep the bag below your hips and bladder. This stops pee (urine) from going back into the tube. °4. Open the pour spout at the bottom of the bag. °5. Empty the pee into the toilet or container. Do not let the pour spout touch any surface. °6. Put rubbing alcohol on a gauze pad or cotton ball. °7. Use the gauze pad or cotton ball to clean the pour spout. °8. Close the pour spout. °9. Attach the bag to your leg with tape or a leg strap. °10. Wash your hands. °Changing a drainage bag  °Supplies Needed °· Alcohol wipes. °· A clean drainage bag. °· Adhesive tape or a leg strap. °Steps °1. Wash your hands with soap and water. °2. Separate the dirty bag from your leg. °3. Pinch the rubber catheter with your fingers so that pee does not spill out. °4. Separate the catheter tube from the drainage tube where these tubes connect (at the connection valve). Do not let the tubes touch any surface. °5. Clean the end of the catheter tube with an alcohol wipe. Use a different alcohol wipe to clean the end of the drainage tube. °6. Connect the catheter tube to the drainage tube of the clean bag. °7. Attach the new bag to   the leg with adhesive tape or a leg strap. 8. Wash your hands. How to prevent infection and other problems  Never pull on your catheter or try to remove it. Pulling can damage tissue in your body.  Always wash your hands before and after touching your catheter.  If a leg strap gets wet, replace it with a dry one.  Drink enough fluids to keep your pee clear or pale yellow, or as told by your doctor.  Do not let the drainage bag or tubing touch the floor.  Wear cotton underwear.  If you are male, wipe from front to back after you poop (have a bowel movement).  Check on the catheter often to make sure it works and the tubing is not twisted. Get help if:  Your pee is cloudy.  Your pee smells unusually bad.  Your pee is not draining into the bag.  Your tube  gets clogged.  Your catheter starts to leak.  Your bladder feels full. Get help right away if:  You have redness, swelling, or pain where the catheter enters your body.  You have fluid, pus, or a bad smell coming from the area where the catheter enters your body.  The area where the catheter enters your body feels warm.  You have a fever.  You have pain in your:  Stomach (abdomen).  Legs.  Lower back.  Bladder.  You see blood fill the catheter.  Your pee is pink or red.  You feel sick to your stomach (nauseous).  You throw up (vomit).  You have chills.  Your catheter gets pulled out. This information is not intended to replace advice given to you by your health care provider. Make sure you discuss any questions you have with your health care provider. Document Released: 09/13/2012 Document Revised: 04/16/2016 Document Reviewed: 11/01/2013 [employer] Interactive Patient Education  2017 [employer] Post Anesthesia Home Care Instructions  Activity: Get plenty of rest for the remainder of the day. A responsible adult should stay with you for 24 hours following the procedure.  For the next 24 hours, DO NOT: -Drive a car -Paediatric nurse -Drink alcoholic beverages -Take any medication unless instructed by your physician -Make any legal decisions or sign important papers.  Meals: Start with liquid foods such as gelatin or soup. Progress to regular foods as tolerated. Avoid greasy, spicy, heavy foods. If nausea and/or vomiting occur, drink only clear liquids until the nausea and/or vomiting subsides. Call your physician if vomiting continues.  Special Instructions/Symptoms: Your throat may feel dry or sore from the anesthesia or the breathing tube placed in your throat during surgery. If this causes discomfort, gargle with warm salt water. The discomfort should disappear within 24 hours.  If you had a scopolamine patch placed behind your ear for the management of post-  operative nausea and/or vomiting:  1. The medication in the patch is effective for 72 hours, after which it should be removed.  Wrap patch in a tissue and discard in the trash. Wash hands thoroughly with soap and water. 2. You may remove the patch earlier than 72 hours if you experience unpleasant side effects which may include dry mouth, dizziness or visual disturbances. 3. Avoid touching the patch. Wash your hands with soap and water after contact with the patch.

## 2016-08-11 NOTE — Anesthesia Postprocedure Evaluation (Signed)
Anesthesia Post Note  Patient: Dustin Hamilton  Procedure(s) Performed: Procedure(s) (LRB): TRANSURETHRAL RESECTION OF BLADDER TUMOR (TURBT) (N/A)  Patient location during evaluation: PACU Anesthesia Type: General Level of consciousness: awake and alert Pain management: pain level controlled Vital Signs Assessment: post-procedure vital signs reviewed and stable Respiratory status: spontaneous breathing, nonlabored ventilation and respiratory function stable Cardiovascular status: blood pressure returned to baseline and stable Postop Assessment: no signs of nausea or vomiting Anesthetic complications: no       Last Vitals:  Vitals:   08/11/16 1359 08/11/16 1430  BP: 135/89 124/83  Pulse: 96   Resp: 14   Temp: 36.3 C     Last Pain:  Vitals:   08/11/16 1124  TempSrc: Oral                 Lynda Rainwater

## 2016-08-11 NOTE — Anesthesia Preprocedure Evaluation (Addendum)
Anesthesia Evaluation  Patient identified by MRN, date of birth, ID band Patient awake    Reviewed: Allergy & Precautions, NPO status , Patient's Chart, lab work & pertinent test results  History of Anesthesia Complications Negative for: history of anesthetic complications  Airway Mallampati: I  TM Distance: >3 FB Neck ROM: Full    Dental  (+) Chipped, Dental Advisory Given   Pulmonary former smoker (quit 1983),    breath sounds clear to auscultation       Cardiovascular hypertension, Pt. on medications and Pt. 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)  ,   Endo/Other  negative endocrine ROS  Renal/GU negative Renal ROS     Musculoskeletal   Abdominal   Peds  Hematology plavix   Anesthesia Other Findings   Reproductive/Obstetrics                            Anesthesia Physical Anesthesia Plan  ASA: III  Anesthesia Plan: General   Post-op Pain Management:    Induction: Intravenous  Airway Management Planned: LMA  Additional Equipment:   Intra-op Plan:   Post-operative Plan:   Informed Consent: I have reviewed the patients History and Physical, chart, labs and discussed the procedure including the risks, benefits and alternatives for the proposed anesthesia with the patient or authorized representative who has indicated his/her understanding and acceptance.   Dental advisory given  Plan Discussed with: CRNA and Surgeon  Anesthesia Plan Comments: (Plan routine monitors, GA- LMA OK)        Anesthesia Quick Evaluation

## 2016-08-11 NOTE — Transfer of Care (Signed)
Immediate Anesthesia Transfer of Care Note  Patient: Dustin Hamilton  Procedure(s) Performed: Procedure(s) (LRB): TRANSURETHRAL RESECTION OF BLADDER TUMOR (TURBT) (N/A)  Patient Location: PACU  Anesthesia Type: General  Level of Consciousness: awake, sedated, patient cooperative and responds to stimulation  Airway & Oxygen Therapy: Patient Spontanous Breathing and Patient connected to face mask oxygen  Post-op Assessment: Report given to PACU RN, Post -op Vital signs reviewed and stable and Patient moving all extremities  Post vital signs: Reviewed and stable  Complications: No apparent anesthesia complications

## 2016-08-11 NOTE — Brief Op Note (Signed)
08/11/2016  1:49 PM  PATIENT:  Dustin Hamilton  76 y.o. male  PRE-OPERATIVE DIAGNOSIS:  URETHRAL MASS  POST-OPERATIVE DIAGNOSIS:  URETHRAL MASS  PROCEDURE:  Procedure(s): TRANSURETHRAL RESECTION OF BLADDER TUMOR (TURBT) (N/A) CYSTOSCOPY WITH RETROGRADE PYELOGRAM (Bilateral)  SURGEON:  Surgeon(s) and Role:    * Cleon Gustin, MD - Primary  PHYSICIAN ASSISTANT:   ASSISTANTS: none   ANESTHESIA:   general  EBL:  Total I/O In: 400 [I.V.:400] Out: 5 [Blood:5]  BLOOD ADMINISTERED:none  DRAINS: Urinary Catheter (Foley)   LOCAL MEDICATIONS USED:  NONE  SPECIMEN:  Source of Specimen:  prostatic urethra tumor  DISPOSITION OF SPECIMEN:  PATHOLOGY  COUNTS:  YES  TOURNIQUET:  * No tourniquets in log *  DICTATION: .Note written in EPIC  PLAN OF CARE: Discharge to home after PACU  PATIENT DISPOSITION:  PACU - hemodynamically stable.   Delay start of Pharmacological VTE agent (>24hrs) due to surgical blood loss or risk of bleeding: not applicable

## 2016-08-12 ENCOUNTER — Encounter (HOSPITAL_BASED_OUTPATIENT_CLINIC_OR_DEPARTMENT_OTHER): Payer: Self-pay | Admitting: Urology

## 2016-08-18 NOTE — Op Note (Signed)
.  Preoperative diagnosis: bladder tumor  Postoperative diagnosis: Same  Procedure: 1 cystoscopy 2. Clot evacuation 3. Transurethral resection of bladder tumor, medium  Attending: Rosie Fate  Anesthesia: General  Estimated blood loss: Minimal  Drains: 22 French foley  Specimens: prostatic urethra tumor  Antibiotics: ancef  Findings: 80cc clot in the bladder. 3cm papillary right prostate lobe tumor.  Friable prostate.  Indications: Patient is a 76 year old male with a history of bladder tumor and gross hematuria.  After discussing treatment options, they decided proceed with transurethral resection of a bladder tumor.  Procedure her in detail: The patient was brought to the operating room and a brief timeout was done to ensure correct patient, correct procedure, correct site.  General anesthesia was administered patient was placed in dorsal lithotomy position.  Their genitalia was then prepped and draped in usual sterile fashion.  A rigid 73 French cystoscope was passed in the urethra and the bladder.  Bladder was inspected and we noted a large amount of clot and a 3cm tumor on the right lateral lobe of the prostate.  the ureteral orifices were in the normal orthotopic locations. We then removed the cystoscope and placed a resectoscope into the bladder. We proceeded to remove the large clot burden from the bladder. Once this was complete we turned our attention to the bladder tumor. Using the bipolar resectoscope we removed the bladder tumor down to the base. Hemostasis was then obtained with electrocautery. We then removed the bladder tumor chips and sent them for pathology. We then re-inspected the bladder and found no residual bleeding.  the bladder was then drained, a 22 French foley was placed and this concluded the procedure which was well tolerated by patient.  Complications: None  Condition: Stable, extubated, transferred to PACU  Plan: Patient is to be discharge home and  followup in 5 days for foley catheter removal and pathology discussion.

## 2016-08-19 ENCOUNTER — Telehealth: Payer: Self-pay | Admitting: Pulmonary Disease

## 2016-08-20 NOTE — Telephone Encounter (Signed)
No message documented in the chart.  error

## 2016-08-21 ENCOUNTER — Ambulatory Visit: Payer: Medicare PPO | Admitting: Pulmonary Disease

## 2016-09-02 ENCOUNTER — Other Ambulatory Visit: Payer: Self-pay | Admitting: Urology

## 2016-10-16 ENCOUNTER — Other Ambulatory Visit: Payer: Self-pay

## 2016-10-16 ENCOUNTER — Encounter (HOSPITAL_COMMUNITY): Payer: Self-pay | Admitting: Emergency Medicine

## 2016-10-16 ENCOUNTER — Emergency Department (HOSPITAL_COMMUNITY): Payer: Medicare PPO

## 2016-10-16 ENCOUNTER — Emergency Department (HOSPITAL_COMMUNITY)
Admission: EM | Admit: 2016-10-16 | Discharge: 2016-10-16 | Disposition: A | Discharge status: Home / Self Care | Payer: Medicare PPO | Attending: Emergency Medicine | Admitting: Emergency Medicine

## 2016-10-16 DIAGNOSIS — R269 Unspecified abnormalities of gait and mobility: Secondary | ICD-10-CM

## 2016-10-16 DIAGNOSIS — R938 Abnormal findings on diagnostic imaging of other specified body structures: Secondary | ICD-10-CM | POA: Insufficient documentation

## 2016-10-16 DIAGNOSIS — Z87891 Personal history of nicotine dependence: Secondary | ICD-10-CM | POA: Insufficient documentation

## 2016-10-16 DIAGNOSIS — R930 Abnormal findings on diagnostic imaging of skull and head, not elsewhere classified: Secondary | ICD-10-CM | POA: Diagnosis not present

## 2016-10-16 DIAGNOSIS — R55 Syncope and collapse: Secondary | ICD-10-CM | POA: Diagnosis present

## 2016-10-16 DIAGNOSIS — Z8673 Personal history of transient ischemic attack (TIA), and cerebral infarction without residual deficits: Secondary | ICD-10-CM | POA: Diagnosis not present

## 2016-10-16 DIAGNOSIS — I119 Hypertensive heart disease without heart failure: Secondary | ICD-10-CM | POA: Diagnosis not present

## 2016-10-16 DIAGNOSIS — Z79899 Other long term (current) drug therapy: Secondary | ICD-10-CM | POA: Diagnosis not present

## 2016-10-16 DIAGNOSIS — R42 Dizziness and giddiness: Secondary | ICD-10-CM

## 2016-10-16 LAB — CBC
HCT: 45.2 % (ref 39.0–52.0)
Hemoglobin: 15.5 g/dL (ref 13.0–17.0)
MCH: 30 pg (ref 26.0–34.0)
MCHC: 34.3 g/dL (ref 30.0–36.0)
MCV: 87.6 fL (ref 78.0–100.0)
PLATELETS: 143 10*3/uL — AB (ref 150–400)
RBC: 5.16 MIL/uL (ref 4.22–5.81)
RDW: 14.6 % (ref 11.5–15.5)
WBC: 6.6 10*3/uL (ref 4.0–10.5)

## 2016-10-16 LAB — URINALYSIS, ROUTINE W REFLEX MICROSCOPIC
Bacteria, UA: NONE SEEN
Bilirubin Urine: NEGATIVE
Glucose, UA: NEGATIVE mg/dL
HGB URINE DIPSTICK: NEGATIVE
Ketones, ur: NEGATIVE mg/dL
NITRITE: NEGATIVE
Protein, ur: NEGATIVE mg/dL
SPECIFIC GRAVITY, URINE: 1.005 (ref 1.005–1.030)
Squamous Epithelial / LPF: NONE SEEN
pH: 6 (ref 5.0–8.0)

## 2016-10-16 LAB — CBG MONITORING, ED: GLUCOSE-CAPILLARY: 75 mg/dL (ref 65–99)

## 2016-10-16 LAB — BASIC METABOLIC PANEL
ANION GAP: 7 (ref 5–15)
BUN: 12 mg/dL (ref 6–20)
CO2: 25 mmol/L (ref 22–32)
Calcium: 9.6 mg/dL (ref 8.9–10.3)
Chloride: 105 mmol/L (ref 101–111)
Creatinine, Ser: 0.96 mg/dL (ref 0.61–1.24)
GFR calc Af Amer: >60 mL/min (ref >60)
GLUCOSE: 92 mg/dL (ref 65–99)
Potassium: 3.5 mmol/L (ref 3.5–5.1)
Sodium: 137 mmol/L (ref 135–145)

## 2016-10-16 LAB — I-STAT TROPONIN, ED: Troponin i, poc: 0 ng/mL (ref 0.00–0.08)

## 2016-10-16 MED ORDER — GADOBENATE DIMEGLUMINE 529 MG/ML IV SOLN
17.0000 mL | Freq: Once | INTRAVENOUS | Status: AC | PRN
  Administered 2016-10-16: 17 mL via INTRAVENOUS

## 2016-10-16 NOTE — ED Provider Notes (Signed)
[facility] DEPT Provider Note   CSN: [id number] Arrival date & time: 10/16/16  1604     History   Chief Complaint Chief Complaint  Patient presents with  . Near Syncope    HPI Dustin Hamilton is a 76 y.o. male.  HPI   Has had balance problem for quite a while, supposed to be doing some therapy. Today was feeling very dizzy and couldn't walk. Felt like when tried to walk felt like needed to hold onto things, felt very off balance. Woke up this morning and was feeling himself, planned on going out, however went outside at 1030AM to take out the trash and couldn't walk normally.  Felt very off balance like he could not walk straight.  No numbness/weakness/facial droop/difficulty talking/change in vision.  Did not feel lightheaded. No room spinning.  No chest pain or dyspnea. No falls. Continues to feel off balance.    Past Medical History:  Diagnosis Date  . BPH (benign prostatic hyperplasia)   . Cerebrovascular disease   . Congenital anomaly of diaphragm   . Elevated PSA   . Glaucoma, both eyes   . Hemorrhoid   . Hepatitis B surface antigen positive    02-20-2011  . History of adenomatous polyp of colon    2007, 2009 and 2013  tubular adenoma's  . History of alcohol abuse    quit 1963  . History of cerebral parenchymal hemorrhage    01/ 2006  left occiptial lobe related to hypertensive crisis  . History of CVA (cerebrovascular accident)    09-12-2012  left hippocampus/ amygdala junction and per MRI old white matter infarcts--  per pt residual short- term memory issues  . History of fatty infiltration of liver hx visit's at [facility] , last visit 05/ 2014   elvated LFT's ,  via liver bx 2004 related to hx alcohol and drug abuse (quit 1964)  . 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 HTN (hypertension)   . Renal cyst, left   . 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 Urethral lesion    urethral mass     Patient Active Problem List   Diagnosis Date Noted  . Dizziness 08/02/2014  . 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 CVA (cerebral infarction) 09/13/2012  . Vertigo 09/12/2012  . EAR PAIN 09/05/2009  . BACK PAIN 06/20/2009  . COLONIC POLYPS 05/06/2007  . 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 NONDEPENDENT ALCOHOL ABUSE IN REMISSION 05/06/2007  . HYPERTENSION, SEVERE 05/06/2007  . Hypertensive heart disease without heart failure 05/06/2007  . INTRACRANIAL HEMORRHAGE 05/06/2007  . Cerebrovascular disease 05/06/2007  . HEMORRHOIDS 05/06/2007  . INGUINAL HERNIA, RIGHT 05/06/2007  . RENAL CYST 05/06/2007  . HEMATURIA UNSPECIFIED 05/06/2007  . 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 PSA, INCREASED 05/06/2007  . LIVER FUNCTION TESTS, ABNORMAL 05/06/2007  . BPH (benign prostatic hypertrophy) with urinary obstruction 05/06/2007  . PERS HX NONCOMPLIANCE W/MED TX PRS HAZARDS HLTH 05/06/2007    Past Surgical History:  Procedure Laterality Date  . CARDIOVASCULAR STRESS TEST  05/05/2007   normal nuclear study w/ no ischemia/  normal LV fucntion and wall motion , ef60%  . COLONOSCOPY  last one 04-06-2012  . CYSTO/  LEFT RETROGRADE PYELOGRAM/ CYTOLOGY WASHINGS/  URETEROSCOPY  03/05/2000  . INGUINAL HERNIA REPAIR Bilateral 1965 and 1980's  . LAPAROSCOPIC INGUINAL HERNIA WITH UMBILICAL HERNIA Right 76/19/5093  . LIVER BIOPSY  1980's and  2004  . TRANSTHORACIC ECHOCARDIOGRAM  09/13/2012   moderate LVH,  ef 60-65%/    . TRANSURETHRAL RESECTION OF BLADDER TUMOR N/A 08/11/2016   Procedure: TRANSURETHRAL RESECTION OF BLADDER TUMOR (TURBT);  Surgeon: Cleon Gustin, MD;  Location[facility];  Service: Urology;  Laterality: N/A;       Home Medications    Prior to Admission medications   Medication Sig Start Date End Date Taking? Authorizing Provider  amLODipine (NORVASC) 10 MG tablet TAKE ONE TABLET BY MOUTH EVERY DAY Patient taking differently: TAKES 1  TABLET TWICE DAILY    Noralee Space, MD  carvedilol (COREG) 25 MG tablet Take 25 mg by mouth 2 (two) times daily with a meal.     [provider]  clopidogrel (PLAVIX) 75 MG tablet Take 1 tablet (75 mg total) by mouth daily with breakfast. 05/06/13   Noralee Space, MD  furosemide (LASIX) 40 MG tablet Take 1 tablet (40 mg total) by mouth daily. 05/03/12   Noralee Space, MD  hydrALAZINE (APRESOLINE) 25 MG tablet TAKE ONE TABLET BY MOUTH TWICE DAILY Patient not taking: Reported on 10/16/2016    Noralee Space, MD  hydrALAZINE (APRESOLINE) 50 MG tablet Take 50 mg by mouth 2 (two) times daily.    [provider]  latanoprost (XALATAN) 0.005 % ophthalmic solution Place 1 drop into both eyes at bedtime.     [provider]  tadalafil (CIALIS) 20 MG tablet Take 1 tablet (20 mg total) by mouth daily as needed for erectile dysfunction. 05/06/13   Noralee Space, MD  tamsulosin (FLOMAX) 0.4 MG CAPS capsule Take 0.8 mg by mouth at bedtime.    [provider]  traMADol (ULTRAM) 50 MG tablet Take 1 tablet (50 mg total) by mouth every 6 (six) hours as needed. Patient not taking: Reported on 10/16/2016 08/11/16   Cleon Gustin, MD    Family History Family History  Problem Relation Age of Onset  . Rheum arthritis Mother   . Diabetes Mother   . Stroke Mother   . Heart attack Mother   . Heart attack Father   . Heart disease Maternal Grandmother   . Rheum arthritis Maternal Grandmother   . Diabetes Maternal Grandmother   . Stroke Maternal Grandmother   . Colon cancer Neg Hx     Social History Social History  Substance Use Topics  . Smoking status: Former Smoker    Packs/day: 1.00    Years: 5.00    Types: Cigarettes    Quit date: 11/30/1981  . Smokeless tobacco: Never Used  . Alcohol use No     Comment: hx abuse -- quit:  1963     Allergies   Penicillins   Review of Systems Review of Systems  Constitutional: Negative for fever.  HENT: Negative for  sore throat.   Eyes: Negative for visual disturbance.  Respiratory: Negative for cough and shortness of breath.   Cardiovascular: Negative for chest pain and palpitations.  Gastrointestinal: Negative for abdominal pain, blood in stool, diarrhea, nausea and vomiting.  Genitourinary: Negative for dysuria.  Musculoskeletal: Positive for gait problem. Negative for back pain and neck stiffness.  Skin: Negative for rash.  Neurological: Negative for dizziness, syncope, facial asymmetry, speech difficulty, weakness, light-headedness, numbness and headaches.     Physical Exam Updated Vital Signs BP (!) 132/92   Pulse 74   Temp 97.7 F (36.5 C) (Oral)   Resp 18   SpO2 96%   Physical  Exam  Constitutional: He is oriented to person, place, and time. He appears well-developed and well-nourished. No distress.  HENT:  Head: Normocephalic and atraumatic.  Eyes: Conjunctivae and EOM are normal.  Neck: Normal range of motion.  Cardiovascular: Normal rate, regular rhythm, normal heart sounds and intact distal pulses.  Exam reveals no gallop and no friction rub.   No murmur heard. Pulmonary/Chest: Effort normal and breath sounds normal. No respiratory distress. He has no wheezes. He has no rales.  Abdominal: Soft. He exhibits no distension. There is no tenderness. There is no guarding.  Musculoskeletal: He exhibits no edema.  Neurological: He is alert and oriented to person, place, and time. He has normal strength. No cranial nerve deficit or sensory deficit. Gait abnormal. Coordination normal. GCS eye subscore is 4. GCS verbal subscore is 5. GCS motor subscore is 6.  Patient unsteady, unable to walk in straight line  Skin: Skin is warm and dry. He is not diaphoretic.  Nursing note and vitals reviewed.    ED Treatments / Results  Labs (all labs ordered are listed, but only abnormal results are displayed) Labs Reviewed  CBC - Abnormal; Notable for the following:       Result Value   Platelets  143 (*)    All other components within normal limits  URINALYSIS, ROUTINE W REFLEX MICROSCOPIC - Abnormal; Notable for the following:    Color, Urine STRAW (*)    Leukocytes, UA SMALL (*)    All other components within normal limits  BASIC METABOLIC PANEL  CBG MONITORING, ED  I-STAT TROPOININ, ED    EKG  EKG Interpretation None       Radiology Ct Head Wo Contrast  Result Date: 10/16/2016 CLINICAL DATA:  76 year old male with sudden onset dizziness and ataxia today. Initial encounter. EXAM: CT HEAD WITHOUT CONTRAST TECHNIQUE: Contiguous axial images were obtained from the base of the skull through the vertex without intravenous contrast. COMPARISON:  Brain MRI, intracranial MRA 09/13/2012, and head CT without contrast 09/12/2012. FINDINGS: Brain: Stable cerebral volume since 2014. Advanced chronic bilateral cerebral white matter hypodensity appears not significantly changed. However, there is new subtle hypodensity in the medial right cerebellum (Series 3, image 10). The remaining posterior fossa gray-white matter differentiation is within normal limits. No superimposed cerebral cortical based infarct. No acute intracranial hemorrhage identified. No midline shift, mass effect, or evidence of intracranial mass lesion. No ventriculomegaly. Vascular: Calcified atherosclerosis at the skull base. Intracranial artery dolichoectasia. No suspicious intracranial vascular hyperdensity. Skull: No acute osseous abnormality identified. Sinuses/Orbits: Mild left paranasal sinus mucosal thickening. Chronic mastoid sclerosis. Partial opacification of the right tympanic cavity, unclear whether this was present in 2014. Other: Visualized orbits and scalp soft tissues are within normal limits. IMPRESSION: 1. Appearance suspicious for small acute to subacute right cerebellar infarct. See series 3, image 10.  No associated hemorrhage or mass effect. 2. Otherwise stable brain since 2014 with chronically advanced  cerebral white matter disease. 3. Chronic sequelae of mastoid inflammation appears stable since 2014. There is partial opacification of the right tympanic cavity. Consider ENT follow-up. Electronically Signed   By: Genevie Ann M.D.   On: 10/16/2016 17:36   Mr Jodene Nam Head Wo Contrast  Result Date: 10/16/2016 CLINICAL DATA:  None. Initial evaluation for ataxia, intermittent dizziness. EXAM: MRI HEAD WITHOUT CONTRAST MRA HEAD WITHOUT CONTRAST MRA NECK WITHOUT AND WITH CONTRAST TECHNIQUE: Multiplanar, multiecho pulse sequences of the brain and surrounding structures were obtained without intravenous contrast. Angiographic images of the Circle  of Willis were obtained using MRA technique without intravenous contrast. Angiographic images of the neck were obtained using MRA technique without and with intravenous contrast. Carotid stenosis measurements (when applicable) are obtained utilizing NASCET criteria, using the distal internal carotid diameter as the denominator. CONTRAST:  50mL MULTIHANCE GADOBENATE DIMEGLUMINE 529 MG/ML IV SOLN COMPARISON:  Comparison made with prior CT from earlier the same day. FINDINGS: MRI HEAD FINDINGS Generalized age-related cerebral atrophy. Patchy and confluent T2/FLAIR hyperintensity within the periventricular and deep white matter both cerebral hemispheres, most consistent with chronic small vessel ischemic disease, moderate nature. Chronic microvascular ischemic changes present within the pons. Associated few scatter remote lacunar type infarctions noted within the periventricular and deep white matter of both cerebral hemispheres. No abnormal foci of restricted diffusion to suggest acute or subacute ischemia. Gray-white matter differentiation maintained. Innumerable predominantly subcentimeter foci susceptibility artifacts seen scattered throughout the brain, predominantly posterior in location, with involvement of the brainstem and cerebellum I. Findings most consistent with small chronic  micro hemorrhages, and are suspected to be hypertensive in etiology given patient's history of hypertension. No mass lesion, midline shift or mass effect. Diffuse ventricular prominence related global parenchymal volume loss of hydrocephalus. No extra-axial fluid collection. Major dural sinuses are grossly patent. Pituitary gland suprasellar region within normal limits. Major intracranial vascular flow voids maintained. Craniocervical junction normal. Degenerative thickening noted at the tectorial membrane without significant stenosis. Remainder the visualized upper cervical spine within normal limits. Bone marrow signal intensity normal. No scalp soft tissue abnormality. Globes and orbital soft tissues within normal limits. Scattered mucosal thickening throughout the paranasal sinuses. No air-fluid level to suggest active sinus infection. Bilateral mastoid effusions. Inner ear structures normal. MRA HEAD FINDINGS ANTERIOR CIRCULATION: Distal cervical segments of the internal carotid arteries are widely patent with antegrade flow. Petrous, cavernous, and supraclinoid segments patent without flow-limiting stenosis. ICA termini widely patent. A1 segments patent. Anterior communicating artery normal. Anterior cerebral arteries patent to their distal aspects. M1 segments patent without stenosis or occlusion. No proximal M2 occlusion. Distal MCA branches widely patent and symmetric. POSTERIOR CIRCULATION: Vertebral arteries largely code dominant and patent to the vertebrobasilar junction. Right PICA patent proximally. Left PICA not visualized. Basilar artery widely patent to its distal aspect. Superior cerebellar and posterior cerebral arteries patent bilaterally. Right PCA somewhat attenuated distally as compared to the left. No aneurysm or vascular malformation. MRA NECK FINDINGS Visualized aortic arch of normal caliber with normal branch pattern. No high-grade stenosis at the origin of the great vessels. Mild  irregularity with mild to moderate stenosis at the proximal right subclavian artery (series 1501, image 72). Subclavian arteries otherwise widely patent. Right common carotid artery patent from its origin to the bifurcation. Mild atheromatous irregularity about the right bifurcation without flow-limiting stenosis. Right ICA widely patent distally to the circle of Willis without flow-limiting stenosis. Left common carotid artery patent from its origin to the bifurcation. Mild atheromatous irregularity about the left bifurcation without flow-limiting stenosis. Left ICA patent distally to the circle of Willis without flow-limiting stenosis. Both of the vertebral arteries arise from the subclavian arteries. Vertebral artery is somewhat tortuous but widely patent to the skullbase without flow-limiting stenosis. IMPRESSION: MRI HEAD IMPRESSION: 1. No acute intracranial infarct or other process identified. 2. Innumerable chronic micro hemorrhages scattered throughout the brain, most likely related to chronic underlying hypertension given patient history. 3. Stable atrophy with moderate chronic microvascular ischemic disease. MRA HEAD IMPRESSION: Negative intracranial MRA. No large or proximal arterial branch occlusion. No high-grade  or correctable stenosis. MRA NECK IMPRESSION: 1. Negative MRA of the neck. No hemodynamically significant or critical stenosis identified within the neck. 2. 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 Electronically Signed   By: Jeannine Boga M.D.   On: 10/16/2016 20:16   Mr Angiogram Neck W Or Wo Contrast  Result Date: 10/16/2016 CLINICAL DATA:  None. Initial evaluation for ataxia, intermittent dizziness. EXAM: MRI HEAD WITHOUT CONTRAST MRA HEAD WITHOUT CONTRAST MRA NECK WITHOUT AND WITH CONTRAST TECHNIQUE: Multiplanar, multiecho pulse sequences of the brain and surrounding structures were obtained without intravenous contrast. Angiographic images of  the Circle of Willis were obtained using MRA technique without intravenous contrast. Angiographic images of the neck were obtained using MRA technique without and with intravenous contrast. Carotid stenosis measurements (when applicable) are obtained utilizing NASCET criteria, using the distal internal carotid diameter as the denominator. CONTRAST:  84mL MULTIHANCE GADOBENATE DIMEGLUMINE 529 MG/ML IV SOLN COMPARISON:  Comparison made with prior CT from earlier the same day. FINDINGS: MRI HEAD FINDINGS Generalized age-related cerebral atrophy. Patchy and confluent T2/FLAIR hyperintensity within the periventricular and deep white matter both cerebral hemispheres, most consistent with chronic small vessel ischemic disease, moderate nature. Chronic microvascular ischemic changes present within the pons. Associated few scatter remote lacunar type infarctions noted within the periventricular and deep white matter of both cerebral hemispheres. No abnormal foci of restricted diffusion to suggest acute or subacute ischemia. Gray-white matter differentiation maintained. Innumerable predominantly subcentimeter foci susceptibility artifacts seen scattered throughout the brain, predominantly posterior in location, with involvement of the brainstem and cerebellum I. Findings most consistent with small chronic micro hemorrhages, and are suspected to be hypertensive in etiology given patient's history of hypertension. No mass lesion, midline shift or mass effect. Diffuse ventricular prominence related global parenchymal volume loss of hydrocephalus. No extra-axial fluid collection. Major dural sinuses are grossly patent. Pituitary gland suprasellar region within normal limits. Major intracranial vascular flow voids maintained. Craniocervical junction normal. Degenerative thickening noted at the tectorial membrane without significant stenosis. Remainder the visualized upper cervical spine within normal limits. Bone marrow signal  intensity normal. No scalp soft tissue abnormality. Globes and orbital soft tissues within normal limits. Scattered mucosal thickening throughout the paranasal sinuses. No air-fluid level to suggest active sinus infection. Bilateral mastoid effusions. Inner ear structures normal. MRA HEAD FINDINGS ANTERIOR CIRCULATION: Distal cervical segments of the internal carotid arteries are widely patent with antegrade flow. Petrous, cavernous, and supraclinoid segments patent without flow-limiting stenosis. ICA termini widely patent. A1 segments patent. Anterior communicating artery normal. Anterior cerebral arteries patent to their distal aspects. M1 segments patent without stenosis or occlusion. No proximal M2 occlusion. Distal MCA branches widely patent and symmetric. POSTERIOR CIRCULATION: Vertebral arteries largely code dominant and patent to the vertebrobasilar junction. Right PICA patent proximally. Left PICA not visualized. Basilar artery widely patent to its distal aspect. Superior cerebellar and posterior cerebral arteries patent bilaterally. Right PCA somewhat attenuated distally as compared to the left. No aneurysm or vascular malformation. MRA NECK FINDINGS Visualized aortic arch of normal caliber with normal branch pattern. No high-grade stenosis at the origin of the great vessels. Mild irregularity with mild to moderate stenosis at the proximal right subclavian artery (series 1501, image 72). Subclavian arteries otherwise widely patent. Right common carotid artery patent from its origin to the bifurcation. Mild atheromatous irregularity about the right bifurcation without flow-limiting stenosis. Right ICA widely patent distally to the circle of Willis without flow-limiting stenosis. Left common carotid artery patent from its origin  to the bifurcation. Mild atheromatous irregularity about the left bifurcation without flow-limiting stenosis. Left ICA patent distally to the circle of Willis without flow-limiting  stenosis. Both of the vertebral arteries arise from the subclavian arteries. Vertebral artery is somewhat tortuous but widely patent to the skullbase without flow-limiting stenosis. IMPRESSION: MRI HEAD IMPRESSION: 1. No acute intracranial infarct or other process identified. 2. Innumerable chronic micro hemorrhages scattered throughout the brain, most likely related to chronic underlying hypertension given patient history. 3. Stable atrophy with moderate chronic microvascular ischemic disease. MRA HEAD IMPRESSION: Negative intracranial MRA. No large or proximal arterial branch occlusion. No high-grade or correctable stenosis. MRA NECK IMPRESSION: 1. Negative MRA of the neck. No hemodynamically significant or critical stenosis identified within the neck. 2. 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 Electronically Signed   By: Jeannine Boga M.D.   On: 10/16/2016 20:16   Mr Brain Wo Contrast  Result Date: 10/16/2016 CLINICAL DATA:  None. Initial evaluation for ataxia, intermittent dizziness. EXAM: MRI HEAD WITHOUT CONTRAST MRA HEAD WITHOUT CONTRAST MRA NECK WITHOUT AND WITH CONTRAST TECHNIQUE: Multiplanar, multiecho pulse sequences of the brain and surrounding structures were obtained without intravenous contrast. Angiographic images of the Circle of Willis were obtained using MRA technique without intravenous contrast. Angiographic images of the neck were obtained using MRA technique without and with intravenous contrast. Carotid stenosis measurements (when applicable) are obtained utilizing NASCET criteria, using the distal internal carotid diameter as the denominator. CONTRAST:  42mL MULTIHANCE GADOBENATE DIMEGLUMINE 529 MG/ML IV SOLN COMPARISON:  Comparison made with prior CT from earlier the same day. FINDINGS: MRI HEAD FINDINGS Generalized age-related cerebral atrophy. Patchy and confluent T2/FLAIR hyperintensity within the periventricular and deep white matter both  cerebral hemispheres, most consistent with chronic small vessel ischemic disease, moderate nature. Chronic microvascular ischemic changes present within the pons. Associated few scatter remote lacunar type infarctions noted within the periventricular and deep white matter of both cerebral hemispheres. No abnormal foci of restricted diffusion to suggest acute or subacute ischemia. Gray-white matter differentiation maintained. Innumerable predominantly subcentimeter foci susceptibility artifacts seen scattered throughout the brain, predominantly posterior in location, with involvement of the brainstem and cerebellum I. Findings most consistent with small chronic micro hemorrhages, and are suspected to be hypertensive in etiology given patient's history of hypertension. No mass lesion, midline shift or mass effect. Diffuse ventricular prominence related global parenchymal volume loss of hydrocephalus. No extra-axial fluid collection. Major dural sinuses are grossly patent. Pituitary gland suprasellar region within normal limits. Major intracranial vascular flow voids maintained. Craniocervical junction normal. Degenerative thickening noted at the tectorial membrane without significant stenosis. Remainder the visualized upper cervical spine within normal limits. Bone marrow signal intensity normal. No scalp soft tissue abnormality. Globes and orbital soft tissues within normal limits. Scattered mucosal thickening throughout the paranasal sinuses. No air-fluid level to suggest active sinus infection. Bilateral mastoid effusions. Inner ear structures normal. MRA HEAD FINDINGS ANTERIOR CIRCULATION: Distal cervical segments of the internal carotid arteries are widely patent with antegrade flow. Petrous, cavernous, and supraclinoid segments patent without flow-limiting stenosis. ICA termini widely patent. A1 segments patent. Anterior communicating artery normal. Anterior cerebral arteries patent to their distal aspects. M1  segments patent without stenosis or occlusion. No proximal M2 occlusion. Distal MCA branches widely patent and symmetric. POSTERIOR CIRCULATION: Vertebral arteries largely code dominant and patent to the vertebrobasilar junction. Right PICA patent proximally. Left PICA not visualized. Basilar artery widely patent to its distal aspect. Superior cerebellar and posterior cerebral arteries patent  bilaterally. Right PCA somewhat attenuated distally as compared to the left. No aneurysm or vascular malformation. MRA NECK FINDINGS Visualized aortic arch of normal caliber with normal branch pattern. No high-grade stenosis at the origin of the great vessels. Mild irregularity with mild to moderate stenosis at the proximal right subclavian artery (series 1501, image 72). Subclavian arteries otherwise widely patent. Right common carotid artery patent from its origin to the bifurcation. Mild atheromatous irregularity about the right bifurcation without flow-limiting stenosis. Right ICA widely patent distally to the circle of Willis without flow-limiting stenosis. Left common carotid artery patent from its origin to the bifurcation. Mild atheromatous irregularity about the left bifurcation without flow-limiting stenosis. Left ICA patent distally to the circle of Willis without flow-limiting stenosis. Both of the vertebral arteries arise from the subclavian arteries. Vertebral artery is somewhat tortuous but widely patent to the skullbase without flow-limiting stenosis. IMPRESSION: MRI HEAD IMPRESSION: 1. No acute intracranial infarct or other process identified. 2. Innumerable chronic micro hemorrhages scattered throughout the brain, most likely related to chronic underlying hypertension given patient history. 3. Stable atrophy with moderate chronic microvascular ischemic disease. MRA HEAD IMPRESSION: Negative intracranial MRA. No large or proximal arterial branch occlusion. No high-grade or correctable stenosis. MRA NECK  IMPRESSION: 1. Negative MRA of the neck. No hemodynamically significant or critical stenosis identified within the neck. 2. 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 Electronically Signed   By: Jeannine Boga M.D.   On: 10/16/2016 20:16    Procedures Procedures (including critical care time)              Medications Ordered in ED Medications  gadobenate dimeglumine (MULTIHANCE) injection 17 mL (17 mLs Intravenous Contrast Given 10/16/16 1925)     Initial Impression / Assessment and Plan / ED Course  I have reviewed the triage vital signs and the nursing notes.  Pertinent labs & imaging results that were available during my care of the patient were reviewed by me and considered in my medical decision making (see chart for details).     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  Patient with mild deficit on exam, discussed with Neurology, not appropriate for code stroke or intervention.  Concern by history of acute worsening of gait/ataxia concerning for possible CVA.  Labs, head CT without acute findings. MR/MRA done shows no sign of CVA however shows microhemorrhages likely in setting of hypertensive diseases.  Blood pressures not significantly elevated in ED however family reports they were at home. Possible this contributed to symptoms. Do not feel further admission for TIA indicated given symptoms present at time and negative MRI.  Worsening of baseline gait abnormality and recommend close follow up with PCP and continued work with physical therapy. Patient discharged in stable condition with understanding of reasons to return.    Final Clinical Impressions(s) / ED Diagnoses   Final diagnoses:  Gait abnormality    New Prescriptions Discharge Medication List as of 10/16/2016  8:38 PM       Gareth Morgan, MD 10/17/16 1112

## 2016-10-16 NOTE — ED Notes (Signed)
Patient transported to MRI 

## 2016-10-16 NOTE — ED Notes (Addendum)
Patient transported to CT 

## 2016-10-16 NOTE — ED Triage Notes (Signed)
Per EMS pt from home 1030 sudden onset dizziness off and on, pt was trying to walk down steps and could not make it down steps because dizziness. Pt (+) orthostatic changes, cbg 90, BP 148/98

## 2016-10-17 NOTE — Patient Instructions (Signed)
Dustin Hamilton  10/17/2016   Your procedure is scheduled on: 10/31/2016    Report to [facility] Main  Entrance Take [place]  elevators to 3rd floor to  [place] at   1030 AM.     Call this number if you have problems the morning of surgery [phone]    Remember: ONLY 1 PERSON MAY GO WITH YOU TO SHORT STAY TO GET  READY MORNING OF [place].  Do not eat food or drink liquids :After Midnight.             Clear liquid diet the day before surgery.               Magnesium Citrate- 1 bottle drink by noon day before surgery.               Fleets enema nite before surgery.      Take these medicines the morning of surgery with A SIP OF WATER: amlodipine ( NORvasc), Carvedilol ( coreg), Hydralazine ( Apresoline)                                 You may not have any metal on your body including hair pins and              piercings  Do not wear jewelry, , lotions, powders or perfumes, deodorant                        Men may shave face and neck.   Do not bring valuables to the hospital. [place].  Contacts, dentures or bridgework may not be worn into surgery.  Leave suitcase in the car. After surgery it may be brought to your room.                     Please read over the following fact sheets you were given: _____________________________________________________________________             [facility] - Preparing for Surgery Before surgery, you can play an important role.  Because skin is not sterile, your skin needs to be as free of germs as possible.  You can reduce the number of germs on your skin by washing with CHG (chlorahexidine gluconate) soap before surgery.  CHG is an antiseptic cleaner which kills germs and bonds with the skin to continue killing germs even after washing. Please DO NOT use if you have an allergy to CHG or antibacterial soaps.  If your skin becomes reddened/irritated stop  using the CHG and inform your nurse when you arrive at Short Stay. Do not shave (including legs and underarms) for at least 48 hours prior to the first CHG shower.  You may shave your face/neck. Please follow these instructions carefully:  1.  Shower with CHG Soap the night before surgery and the  morning of Surgery.  2.  If you choose to wash your hair, wash your hair first as usual with your  normal  shampoo.  3.  After you shampoo, rinse your hair and body thoroughly to remove the  shampoo.  4.  Use CHG as you would any other liquid soap.  You can apply chg directly  to the skin and wash                       Gently with a scrungie or clean washcloth.  5.  Apply the CHG Soap to your body ONLY FROM THE NECK DOWN.   Do not use on face/ open                           Wound or open sores. Avoid contact with eyes, ears mouth and genitals (private parts).                       Wash face,  Genitals (private parts) with your normal soap.             6.  Wash thoroughly, paying special attention to the area where your surgery  will be performed.  7.  Thoroughly rinse your body with warm water from the neck down.  8.  DO NOT shower/wash with your normal soap after using and rinsing off  the CHG Soap.                9.  Pat yourself dry with a clean towel.            10.  Wear clean pajamas.            11.  Place clean sheets on your bed the night of your first shower and do not  sleep with pets. Day of Surgery : Do not apply any lotions/deodorants the morning of surgery.  Please wear clean clothes to the hospital/surgery center.  FAILURE TO FOLLOW THESE INSTRUCTIONS MAY RESULT IN THE CANCELLATION OF YOUR SURGERY PATIENT SIGNATURE_________________________________  NURSE SIGNATURE__________________________________  ________________________________________________________________________    CLEAR LIQUID DIET   Foods Allowed                                                                      Foods Excluded  Coffee and tea, regular and decaf                             liquids that you cannot  Plain Jell-O in any flavor                                             see through such as: Fruit ices (not with fruit pulp)                                     milk, soups, orange juice  Iced Popsicles                                    All solid food Carbonated beverages, regular and diet  Cranberry, grape and apple juices Sports drinks like Gatorade Lightly seasoned clear broth or consume(fat free) Sugar, honey syrup  Sample Menu Breakfast                                Lunch                                     Supper Cranberry juice                    Beef broth                            Chicken broth Jell-O                                     Grape juice                           Apple juice Coffee or tea                        Jell-O                                      Popsicle                                                Coffee or tea                        Coffee or tea  _____________________________________________________________________   WHAT IS A BLOOD TRANSFUSION? Blood Transfusion Information  A transfusion is the replacement of blood or some of its parts. Blood is made up of multiple cells which provide different functions.  Red blood cells carry oxygen and are used for blood loss replacement.  White blood cells fight against infection.  Platelets control bleeding.  Plasma helps clot blood.  Other blood products are available for specialized needs, such as hemophilia or other clotting disorders. BEFORE THE TRANSFUSION  Who gives blood for transfusions?   Healthy volunteers who are fully evaluated to make sure their blood is safe. This is blood bank blood. Transfusion therapy is the safest it has ever been in the practice of medicine. Before blood is taken from a donor, a complete history is taken to make  sure that person has no history of diseases nor engages in risky social behavior (examples are intravenous drug use or sexual activity with multiple partners). The donor's travel history is screened to minimize risk of transmitting infections, such as malaria. The donated blood is tested for signs of infectious diseases, such as HIV and hepatitis. The blood is then tested to be sure it is compatible with you in order to minimize the chance of a transfusion reaction. If you or a relative donates blood, this is often done in anticipation of surgery and is not appropriate for emergency situations. It takes many days to process the donated blood.  RISKS AND COMPLICATIONS Although transfusion therapy is very safe and saves many lives, the main dangers of transfusion include:   Getting an infectious disease.  Developing a transfusion reaction. This is an allergic reaction to something in the blood you were given. Every precaution is taken to prevent this. The decision to have a blood transfusion has been considered carefully by your caregiver before blood is given. Blood is not given unless the benefits outweigh the risks. AFTER THE TRANSFUSION  Right after receiving a blood transfusion, you will usually feel much better and more energetic. This is especially true if your red blood cells have gotten low (anemic). The transfusion raises the level of the red blood cells which carry oxygen, and this usually causes an energy increase.  The nurse administering the transfusion will monitor you carefully for complications. HOME CARE INSTRUCTIONS  No special instructions are needed after a transfusion. You may find your energy is better. Speak with your caregiver about any limitations on activity for underlying diseases you may have. SEEK MEDICAL CARE IF:   Your condition is not improving after your transfusion.  You develop redness or irritation at the intravenous (IV) site. SEEK IMMEDIATE MEDICAL CARE IF:   Any of the following symptoms occur over the next 12 hours:  Shaking chills.  You have a temperature by mouth above 102 F (38.9 C), not controlled by medicine.  Chest, back, or muscle pain.  People around you feel you are not acting correctly or are confused.  Shortness of breath or difficulty breathing.  Dizziness and fainting.  You get a rash or develop hives.  You have a decrease in urine output.  Your urine turns a dark color or changes to pink, red, or brown. Any of the following symptoms occur over the next 10 days:  You have a temperature by mouth above 102 F (38.9 C), not controlled by medicine.  Shortness of breath.  Weakness after normal activity.  The white part of the eye turns yellow (jaundice).  You have a decrease in the amount of urine or are urinating less often.  Your urine turns a dark color or changes to pink, red, or brown. Document Released: 05/16/2000 Document Revised: 08/11/2011 Document Reviewed: 01/03/2008 [facility] Patient Information 2014 [place], [place].  _______________________________________________________________________

## 2016-10-21 ENCOUNTER — Encounter (HOSPITAL_COMMUNITY)
Admission: RE | Admit: 2016-10-21 | Discharge: 2016-10-21 | Disposition: A | Discharge status: Home / Self Care | Payer: Medicare PPO | Source: Ambulatory Visit | Attending: Urology | Admitting: Urology

## 2016-10-21 DIAGNOSIS — N400 Enlarged prostate without lower urinary tract symptoms: Secondary | ICD-10-CM | POA: Insufficient documentation

## 2016-10-21 DIAGNOSIS — Z01818 Encounter for other preprocedural examination: Secondary | ICD-10-CM | POA: Insufficient documentation

## 2016-10-24 ENCOUNTER — Encounter (HOSPITAL_COMMUNITY)
Admission: RE | Admit: 2016-10-24 | Discharge: 2016-10-24 | Disposition: A | Discharge status: Home / Self Care | Payer: Medicare PPO | Source: Ambulatory Visit | Attending: Urology | Admitting: Urology

## 2016-10-24 ENCOUNTER — Encounter (HOSPITAL_COMMUNITY): Payer: Self-pay

## 2016-10-24 DIAGNOSIS — N400 Enlarged prostate without lower urinary tract symptoms: Secondary | ICD-10-CM | POA: Diagnosis not present

## 2016-10-24 DIAGNOSIS — Z01818 Encounter for other preprocedural examination: Secondary | ICD-10-CM | POA: Diagnosis present

## 2016-10-24 HISTORY — DX: Cerebral infarction, unspecified: I63.9

## 2016-10-24 LAB — COMPREHENSIVE METABOLIC PANEL
ALK PHOS: 58 U/L (ref 38–126)
ALT: 34 U/L (ref 17–63)
AST: 33 U/L (ref 15–41)
Albumin: 4.1 g/dL (ref 3.5–5.0)
Anion gap: 5 (ref 5–15)
BUN: 20 mg/dL (ref 6–20)
CALCIUM: 9.7 mg/dL (ref 8.9–10.3)
CHLORIDE: 108 mmol/L (ref 101–111)
CO2: 28 mmol/L (ref 22–32)
CREATININE: 1.05 mg/dL (ref 0.61–1.24)
GFR calc non Af Amer: >60 mL/min (ref >60)
GLUCOSE: 112 mg/dL — AB (ref 65–99)
Potassium: 4.1 mmol/L (ref 3.5–5.1)
SODIUM: 141 mmol/L (ref 135–145)
Total Bilirubin: 0.9 mg/dL (ref 0.3–1.2)
Total Protein: 8 g/dL (ref 6.5–8.1)

## 2016-10-24 LAB — CBC
HCT: 45.9 % (ref 39.0–52.0)
Hemoglobin: 15.4 g/dL (ref 13.0–17.0)
MCH: 29.3 pg (ref 26.0–34.0)
MCHC: 33.6 g/dL (ref 30.0–36.0)
MCV: 87.4 fL (ref 78.0–100.0)
Platelets: 165 10*3/uL (ref 150–400)
RBC: 5.25 MIL/uL (ref 4.22–5.81)
RDW: 14.2 % (ref 11.5–15.5)
WBC: 5.4 10*3/uL (ref 4.0–10.5)

## 2016-10-24 NOTE — Progress Notes (Signed)
EKG-10/17/16-epic

## 2016-10-24 NOTE — Patient Instructions (Signed)
DAJION BICKFORD  10/24/2016   Your procedure is scheduled on:   Report to [facility] Main  Entrance Take [place]  elevators to 3rd floor to  [place] at AM.  Report to admitting at AM Follow signs to Short Stay on first floor at AM  Call this number if you have problems the morning of surgery [phone]   Remember: ONLY 1 PERSON MAY GO WITH YOU TO SHORT STAY TO GET  [place].  Do not eat food or drink liquids :After Midnight.     Take these medicines the morning of surgery with A SIP OF WATER:  DO NOT TAKE ANY DIABETIC MEDICATIONS DAY OF YOUR SURGERY                               You may not have any metal on your body including hair pins and              piercings  Do not wear jewelry, make-up, lotions, powders or perfumes, deodorant             Do not wear nail polish.  Do not shave  48 hours prior to surgery.              Men may shave face and neck.   Do not bring valuables to the hospital. [place].  Contacts, dentures or bridgework may not be worn into surgery.  Leave suitcase in the car. After surgery it may be brought to your room.     Patients discharged the day of surgery will not be allowed to drive home.  Name and phone number of your driver:  Special Instructions: N/A              Please read over the following fact sheets you were given: _____________________________________________________________________             [facility] - Preparing for Surgery Before surgery, you can play an important role.  Because skin is not sterile, your skin needs to be as free of germs as possible.  You can reduce the number of germs on your skin by washing with CHG (chlorahexidine gluconate) soap before surgery.  CHG is an antiseptic cleaner which kills germs and bonds with the skin to continue killing germs even after washing. Please DO NOT use if you have an  allergy to CHG or antibacterial soaps.  If your skin becomes reddened/irritated stop using the CHG and inform your nurse when you arrive at Short Stay. Do not shave (including legs and underarms) for at least 48 hours prior to the first CHG shower.  You may shave your face/neck. Please follow these instructions carefully:  1.  Shower with CHG Soap the night before surgery and the  morning of Surgery.  2.  If you choose to wash your hair, wash your hair first as usual with your  normal  shampoo.  3.  After you shampoo, rinse your hair and body thoroughly to remove the  shampoo.                           4.  Use CHG as you would any  other liquid soap.  You can apply chg directly  to the skin and wash                       Gently with a scrungie or clean washcloth.  5.  Apply the CHG Soap to your body ONLY FROM THE NECK DOWN.   Do not use on face/ open                           Wound or open sores. Avoid contact with eyes, ears mouth and genitals (private parts).                       Wash face,  Genitals (private parts) with your normal soap.             6.  Wash thoroughly, paying special attention to the area where your surgery  will be performed.  7.  Thoroughly rinse your body with warm water from the neck down.  8.  DO NOT shower/wash with your normal soap after using and rinsing off  the CHG Soap.                9.  Pat yourself dry with a clean towel.            10.  Wear clean pajamas.            11.  Place clean sheets on your bed the night of your first shower and do not  sleep with pets. Day of Surgery : Do not apply any lotions/deodorants the morning of surgery.  Please wear clean clothes to the hospital/surgery center.  FAILURE TO FOLLOW THESE INSTRUCTIONS MAY RESULT IN THE CANCELLATION OF YOUR SURGERY PATIENT SIGNATURE_________________________________  NURSE  SIGNATURE__________________________________  ________________________________________________________________________    CLEAR LIQUID DIET   Foods Allowed                                                                     Foods Excluded  Coffee and tea, regular and decaf                             liquids that you cannot  Plain Jell-O in any flavor                                             see through such as: Fruit ices (not with fruit pulp)                                     milk, soups, orange juice  Iced Popsicles                                    All solid food Carbonated beverages, regular and diet  Cranberry, grape and apple juices Sports drinks like Gatorade Lightly seasoned clear broth or consume(fat free) Sugar, honey syrup  Sample Menu Breakfast                                Lunch                                     Supper Cranberry juice                    Beef broth                            Chicken broth Jell-O                                     Grape juice                           Apple juice Coffee or tea                        Jell-O                                      Popsicle                                                Coffee or tea                        Coffee or tea  _____________________________________________________________________

## 2016-10-24 NOTE — Progress Notes (Signed)
Patient in ED oon 10/16/16 for dizziness.  [facility] [place] [facility]2016 and saw Dr Rosana Hoes( PCP) on 10/21/16.  EKG was done Copy on chart.   Echo from 2017 on chart  Heart monitor was placed by [facility] in [place] Will be on for 14 days.  Spoke with Anesthesia( Dr Adele Barthel) .  Per anesthesia surgery needs to be postponed until cardiac clearance received and results of Heart monitor are known.  Called and [facility] for Noemi Chapel (  surgery Scheduler for Dr Nicolette Bang)  And made her aware of above that patient needs clearance prior to surgery and that I will attempt to obtain further records from [place] in [place].

## 2016-10-31 ENCOUNTER — Encounter (HOSPITAL_COMMUNITY): Admission: RE | Payer: Self-pay | Source: Ambulatory Visit

## 2016-10-31 ENCOUNTER — Inpatient Hospital Stay (HOSPITAL_COMMUNITY): Admission: RE | Admit: 2016-10-31 | Payer: Medicare PPO | Source: Ambulatory Visit | Admitting: Urology

## 2016-10-31 SURGERY — PROSTATECTOMY, SIMPLE, ROBOT-ASSISTED
Anesthesia: General

## 2017-02-04 ENCOUNTER — Emergency Department (HOSPITAL_COMMUNITY)
Admission: EM | Admit: 2017-02-04 | Discharge: 2017-02-04 | Disposition: A | Discharge status: Home / Self Care | Payer: Medicare PPO | Attending: Emergency Medicine | Admitting: Emergency Medicine

## 2017-02-04 ENCOUNTER — Encounter (HOSPITAL_COMMUNITY): Payer: Self-pay | Admitting: Emergency Medicine

## 2017-02-04 DIAGNOSIS — Z79899 Other long term (current) drug therapy: Secondary | ICD-10-CM | POA: Insufficient documentation

## 2017-02-04 DIAGNOSIS — R42 Dizziness and giddiness: Secondary | ICD-10-CM | POA: Diagnosis present

## 2017-02-04 DIAGNOSIS — Z87891 Personal history of nicotine dependence: Secondary | ICD-10-CM | POA: Diagnosis not present

## 2017-02-04 DIAGNOSIS — I10 Essential (primary) hypertension: Secondary | ICD-10-CM | POA: Insufficient documentation

## 2017-02-04 DIAGNOSIS — Z7902 Long term (current) use of antithrombotics/antiplatelets: Secondary | ICD-10-CM | POA: Diagnosis not present

## 2017-02-04 LAB — CBC
HCT: 46.8 % (ref 39.0–52.0)
Hemoglobin: 15.9 g/dL (ref 13.0–17.0)
MCH: 30.3 pg (ref 26.0–34.0)
MCHC: 34 g/dL (ref 30.0–36.0)
MCV: 89.3 fL (ref 78.0–100.0)
Platelets: 175 10*3/uL (ref 150–400)
RBC: 5.24 MIL/uL (ref 4.22–5.81)
RDW: 15.1 % (ref 11.5–15.5)
WBC: 5.3 10*3/uL (ref 4.0–10.5)

## 2017-02-04 LAB — BASIC METABOLIC PANEL
Anion gap: 8 (ref 5–15)
BUN: 17 mg/dL (ref 6–20)
CALCIUM: 9.9 mg/dL (ref 8.9–10.3)
CO2: 27 mmol/L (ref 22–32)
CREATININE: 1.22 mg/dL (ref 0.61–1.24)
Chloride: 108 mmol/L (ref 101–111)
GFR calc non Af Amer: 56 mL/min — ABNORMAL LOW (ref >60)
Glucose, Bld: 105 mg/dL — ABNORMAL HIGH (ref 65–99)
Potassium: 3.7 mmol/L (ref 3.5–5.1)
SODIUM: 143 mmol/L (ref 135–145)

## 2017-02-04 LAB — URINALYSIS, ROUTINE W REFLEX MICROSCOPIC
BILIRUBIN URINE: NEGATIVE
Bacteria, UA: NONE SEEN
Glucose, UA: NEGATIVE mg/dL
Ketones, ur: NEGATIVE mg/dL
NITRITE: NEGATIVE
PH: 5 (ref 5.0–8.0)
Protein, ur: 30 mg/dL — AB
SPECIFIC GRAVITY, URINE: 1.011 (ref 1.005–1.030)

## 2017-02-04 NOTE — Discharge Instructions (Signed)
Continue to take your medications as prescribed.  Call your PCP for a follow-up appointment as soon as possible.  Ask her if she can help you get in to see the neurologist, before November for further evaluation.

## 2017-02-04 NOTE — ED Provider Notes (Signed)
[facility] DEPT Provider Note   CSN: [id number] Arrival date & time: 02/04/17  1510     History   Chief Complaint Chief Complaint  Patient presents with  . Dizziness    HPI Dustin Hamilton is a 76 y.o. male.  He presents by private vehicle for evaluation of dizziness.  Symptoms present for several months, intermittently, and he has been previously evaluated by his PCP, a neurologist, and had a short-term hospitalization during which she had a comprehensive evaluation done.  This latter testing was done 01/24/17, following an episode of weakness after receiving clonidine for high blood pressure.  Findings were reassuring and he was discharged to follow-up with his PCP as needed.  He has not been back to his PCP, since then.  He saw a neurologist during this timeframe, and was told that he may have had a seizure, TIA, or other nonspecific causes of dizziness.  His dizziness is characterized by balance problem, and for this his PCP has initiated physical therapy, and he is using a rolling walker for protection against falling.  His follow-up appointment with neurology is in November 2018.  He denies ongoing headache, periods of chest pain, abdominal pain, dysuria, constipation, focal paresthesia or weakness.  He is taking his usual medicines as directed.  He is not taking medications for dizziness.  His dizziness is described as occasional spinning sensation but also feeling like he is going to fall down.  Patient's wife states the major reason she brought him here for the recurrent symptoms, was that she was worried that his blood pressure was either too high, or too low.  There are no other known modifying factors.  HPI  Past Medical History:  Diagnosis Date  . BPH (benign prostatic hyperplasia)   . Cerebrovascular disease   . Congenital anomaly of diaphragm   . Elevated PSA   . Glaucoma, both eyes   . Hemorrhoid   . Hepatitis B surface antigen positive    02-20-2011  . History of  adenomatous polyp of colon    2007, 2009 and 2013  tubular adenoma's  . History of alcohol abuse    quit 1963  . History of cerebral parenchymal hemorrhage    01/ 2006  left occiptial lobe related to hypertensive crisis  . History of CVA (cerebrovascular accident)    09-12-2012  left hippocampus/ amygdala junction and per MRI old white matter infarcts--  per pt residual short- term memory issues  . History of fatty infiltration of liver hx visit's at [facility] , last visit 05/ 2014   elvated LFT's ,  via liver bx 2004 related to hx alcohol and drug abuse (quit 1964)  . 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 HTN (hypertension)   . Renal cyst, left   . Stroke ([place])    hx of 3 strokes in past   . 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 Urethral lesion    urethral mass    Patient Active Problem List   Diagnosis Date Noted  . Dizziness 08/02/2014  . 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 CVA (cerebral infarction) 09/13/2012  . Vertigo 09/12/2012  . EAR PAIN 09/05/2009  . BACK PAIN 06/20/2009  . COLONIC POLYPS 05/06/2007  . 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 NONDEPENDENT ALCOHOL ABUSE IN REMISSION 05/06/2007  . HYPERTENSION, SEVERE 05/06/2007  . Hypertensive heart disease without heart failure 05/06/2007  . INTRACRANIAL HEMORRHAGE  05/06/2007  . Cerebrovascular disease 05/06/2007  . HEMORRHOIDS 05/06/2007  . INGUINAL HERNIA, RIGHT 05/06/2007  . RENAL CYST 05/06/2007  . HEMATURIA UNSPECIFIED 05/06/2007  . 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 PSA, INCREASED 05/06/2007  . LIVER FUNCTION TESTS, ABNORMAL 05/06/2007  . BPH (benign prostatic hypertrophy) with urinary obstruction 05/06/2007  . PERS HX NONCOMPLIANCE W/MED TX PRS HAZARDS HLTH 05/06/2007    Past Surgical History:  Procedure Laterality Date  . CARDIOVASCULAR STRESS TEST  05/05/2007   normal nuclear study w/ no ischemia/  normal LV  fucntion and wall motion , ef60%  . COLONOSCOPY  last one 04-06-2012  . CYSTO/  LEFT RETROGRADE PYELOGRAM/ CYTOLOGY WASHINGS/  URETEROSCOPY  03/05/2000  . INGUINAL HERNIA REPAIR Bilateral 1965 and 1980's  . LAPAROSCOPIC INGUINAL HERNIA WITH UMBILICAL HERNIA Right 93/81/8299  . LIVER BIOPSY  1980's and 2004  . TRANSTHORACIC ECHOCARDIOGRAM  09/13/2012   moderate LVH,  ef 60-65%/    . TRANSURETHRAL RESECTION OF BLADDER TUMOR N/A 08/11/2016   Procedure: TRANSURETHRAL RESECTION OF BLADDER TUMOR (TURBT);  Surgeon: Cleon Gustin, MD;  Location: [facility];  Service: Urology;  Laterality: N/A;       Home Medications    Prior to Admission medications   Medication Sig Start Date End Date Taking? Authorizing Provider  amLODipine (NORVASC) 10 MG tablet TAKE ONE TABLET BY MOUTH EVERY DAY   Yes Noralee Space, MD  atorvastatin (LIPITOR) 40 MG tablet Take 40 mg by mouth at bedtime.  01/24/17  Yes [provider]  carvedilol (COREG) 25 MG tablet Take 25 mg by mouth 2 (two) times daily with a meal.    Yes [provider]  clopidogrel (PLAVIX) 75 MG tablet Take 75 mg by mouth daily.    Yes [provider]  finasteride (PROSCAR) 5 MG tablet Take 5 mg by mouth daily.    Yes [provider]  furosemide (LASIX) 40 MG tablet Take 1 tablet (40 mg total) by mouth daily. 05/03/12  Yes Noralee Space, MD  hydrALAZINE (APRESOLINE) 50 MG tablet Take 50 mg by mouth 3 (three) times daily.    Yes [provider]  latanoprost (XALATAN) 0.005 % ophthalmic solution Place 1 drop into both eyes at bedtime.    Yes [provider]  tamsulosin (FLOMAX) 0.4 MG CAPS capsule Take 0.8 mg by mouth at bedtime.   Yes [provider]  clopidogrel (PLAVIX) 75 MG tablet Take 1 tablet (75 mg total) by mouth daily with breakfast. Patient not taking: Reported on 02/04/2017 05/06/13   Noralee Space, MD  hydrALAZINE (APRESOLINE) 25 MG tablet TAKE ONE TABLET BY  MOUTH TWICE DAILY Patient not taking: Reported on 10/16/2016    Noralee Space, MD  tadalafil (CIALIS) 20 MG tablet Take 1 tablet (20 mg total) by mouth daily as needed for erectile dysfunction. Patient not taking: Reported on 02/04/2017 05/06/13   Noralee Space, MD  tadalafil (CIALIS) 20 MG tablet Take 20 mg by mouth daily as needed for erectile dysfunction.     [provider]  traMADol (ULTRAM) 50 MG tablet Take 1 tablet (50 mg total) by mouth every 6 (six) hours as needed. Patient not taking: Reported on 10/16/2016 08/11/16   Cleon Gustin, MD    Family History Family History  Problem Relation Age of Onset  . Rheum arthritis Mother   . Diabetes Mother   . Stroke Mother   . Heart attack Mother   . Heart attack  Father   . Heart disease Maternal Grandmother   . Rheum arthritis Maternal Grandmother   . Diabetes Maternal Grandmother   . Stroke Maternal Grandmother   . Colon cancer Neg Hx     Social History Social History  Substance Use Topics  . Smoking status: Former Smoker    Packs/day: 1.00    Years: 5.00    Types: Cigarettes    Quit date: 11/30/1981  . Smokeless tobacco: Never Used  . Alcohol use No     Comment: hx abuse -- quit:  1963     Allergies   Penicillins   Review of Systems Review of Systems  All other systems reviewed and are negative.    Physical Exam Updated Vital Signs BP (!) 160/104 (BP Location: Right Arm)   Pulse 71   Temp (!) 97.5 F (36.4 C) (Oral)   Resp 16   Wt 81.2 kg (179 lb)   SpO2 94%   BMI 24.97 kg/m   Physical Exam  Constitutional: He is oriented to person, place, and time. He appears well-developed. No distress.  Elderly, frail  HENT:  Head: Normocephalic and atraumatic.  Right Ear: External ear normal.  Left Ear: External ear normal.  Eyes: Pupils are equal, round, and reactive to light. Conjunctivae and EOM are normal.  Neck: Normal range of motion and phonation normal. Neck supple.  Cardiovascular: Normal  rate, regular rhythm and normal heart sounds.   Pulmonary/Chest: Effort normal and breath sounds normal. He exhibits no bony tenderness.  Abdominal: Soft. There is no tenderness.  Musculoskeletal: Normal range of motion.  No dysarthria, aphasia, or nystagmus.  Normal finger to nose and heel to shin bilaterally.  No pronator drift or ataxia.  Neurological: He is alert and oriented to person, place, and time. No cranial nerve deficit or sensory deficit. He exhibits normal muscle tone. Coordination normal.  Skin: Skin is warm, dry and intact.  Psychiatric: He has a normal mood and affect. His behavior is normal. Judgment and thought content normal.  Nursing note and vitals reviewed.    ED Treatments / Results  Labs (all labs ordered are listed, but only abnormal results are displayed) Labs Reviewed  BASIC METABOLIC PANEL - Abnormal; Notable for the following:       Result Value   Glucose, Bld 105 (*)    GFR calc non Af Amer 56 (*)    All other components within normal limits  URINALYSIS, ROUTINE W REFLEX MICROSCOPIC - Abnormal; Notable for the following:    Hgb urine dipstick SMALL (*)    Protein, ur 30 (*)    Leukocytes, UA LARGE (*)    Squamous Epithelial / LPF 0-5 (*)    All other components within normal limits  CBC  CBG MONITORING, ED    EKG  EKG Interpretation  Date/Time:  Wednesday February 04 2017 21:18:50 EDT Ventricular Rate:  71 PR Interval:    QRS Duration: 118 QT Interval:  404 QTC Calculation: 439 R Axis:   -66 Text Interpretation:  Sinus rhythm Atrial premature complex Prolonged PR interval Incomplete right bundle branch block Anterior infarct, old since last tracing no significant change Confirmed by Daleen Bo [id number]) on 02/04/2017 9:57:31 PM       Radiology No results found.  Procedures Procedures (including critical care time)  Medications Ordered in ED Medications - No data to display   Initial Impression / Assessment and Plan / ED Course    I have reviewed the triage vital signs and the  nursing notes.  Pertinent labs & imaging results that were available during my care of the patient were reviewed by me and considered in my medical decision making (see chart for details).      Patient Vitals for the past 24 hrs:  BP Temp Temp src Pulse Resp SpO2 Weight  02/04/17 2002 (!) 160/104 - - 71 16 94 % -  02/04/17 1549 (!) 147/90 (!) 97.5 F (36.4 C) Oral 82 18 97 % 81.2 kg (179 lb)    10:13 PM Reevaluation with update and discussion. After initial assessment and treatment, an updated evaluation reveals he remains comfortable has no further complaints.  Findings discussed with patient and wife, all questions answered. Raina Sole L      Final Clinical Impressions(s) / ED Diagnoses   Final diagnoses:  Dizziness   Nonspecific dizziness, with hypertension.  Doubt hypertensive urgency, CVA, metabolic instability or impending vascular collapse.  Nursing Notes Reviewed/ Care Coordinated Applicable Imaging Reviewed Interpretation of Laboratory Data incorporated into ED treatment  The patient appears reasonably screened and/or stabilized for discharge and I doubt any other medical condition or other [facility] requiring further screening, evaluation, or treatment in the ED at this time prior to discharge.  Plan: Home Medications-continue usual medications; Home Treatments-rest, low-sodium diet; return here if the recommended treatment, does not improve the symptoms; Recommended follow up-PCP checkup for blood pressure evaluation in 1 week.  Consider advancing appointment with neurology to sooner.   New Prescriptions New Prescriptions   No medications on file     Daleen Bo, MD 02/04/17 2215

## 2017-02-04 NOTE — ED Triage Notes (Signed)
Pt presents with dizziness that is an on-going issue.  States his PCP believes it is vertigo.  Pt has HTN and has tried two different medications, one in which made him altered. PCP told him to start his medicine back that he was on before the altering medication.  Denies headache. States dizziness in random in nature.

## 2017-05-05 ENCOUNTER — Encounter: Payer: Self-pay | Admitting: Gastroenterology

## 2017-05-30 ENCOUNTER — Encounter (HOSPITAL_COMMUNITY): Payer: Self-pay | Admitting: Family Medicine

## 2017-05-30 ENCOUNTER — Other Ambulatory Visit: Payer: Self-pay

## 2017-05-30 ENCOUNTER — Emergency Department (HOSPITAL_COMMUNITY): Payer: Medicare PPO

## 2017-05-30 ENCOUNTER — Emergency Department (HOSPITAL_COMMUNITY)
Admission: EM | Admit: 2017-05-30 | Discharge: 2017-05-30 | Disposition: A | Discharge status: Home / Self Care | Payer: Medicare PPO | Attending: Emergency Medicine | Admitting: Emergency Medicine

## 2017-05-30 ENCOUNTER — Ambulatory Visit (HOSPITAL_COMMUNITY)
Admission: EM | Admit: 2017-05-30 | Discharge: 2017-05-30 | Disposition: A | Discharge status: Home / Self Care | Payer: Medicare PPO | Source: Home / Self Care | Attending: Family Medicine | Admitting: Family Medicine

## 2017-05-30 ENCOUNTER — Encounter (HOSPITAL_COMMUNITY): Payer: Self-pay | Admitting: *Deleted

## 2017-05-30 DIAGNOSIS — R42 Dizziness and giddiness: Secondary | ICD-10-CM | POA: Insufficient documentation

## 2017-05-30 DIAGNOSIS — R000 Tachycardia, unspecified: Secondary | ICD-10-CM | POA: Diagnosis not present

## 2017-05-30 DIAGNOSIS — I10 Essential (primary) hypertension: Secondary | ICD-10-CM | POA: Diagnosis not present

## 2017-05-30 DIAGNOSIS — Z8673 Personal history of transient ischemic attack (TIA), and cerebral infarction without residual deficits: Secondary | ICD-10-CM | POA: Diagnosis not present

## 2017-05-30 DIAGNOSIS — R5383 Other fatigue: Secondary | ICD-10-CM | POA: Insufficient documentation

## 2017-05-30 DIAGNOSIS — Z87891 Personal history of nicotine dependence: Secondary | ICD-10-CM | POA: Diagnosis not present

## 2017-05-30 DIAGNOSIS — R079 Chest pain, unspecified: Secondary | ICD-10-CM

## 2017-05-30 DIAGNOSIS — I471 Supraventricular tachycardia: Secondary | ICD-10-CM | POA: Diagnosis not present

## 2017-05-30 DIAGNOSIS — Z7901 Long term (current) use of anticoagulants: Secondary | ICD-10-CM | POA: Diagnosis not present

## 2017-05-30 DIAGNOSIS — Z79899 Other long term (current) drug therapy: Secondary | ICD-10-CM | POA: Diagnosis not present

## 2017-05-30 LAB — I-STAT TROPONIN, ED: TROPONIN I, POC: 0.02 ng/mL (ref 0.00–0.08)

## 2017-05-30 LAB — CBC
HCT: 49.6 % (ref 39.0–52.0)
HEMOGLOBIN: 17.2 g/dL — AB (ref 13.0–17.0)
MCH: 32 pg (ref 26.0–34.0)
MCHC: 34.7 g/dL (ref 30.0–36.0)
MCV: 92.2 fL (ref 78.0–100.0)
Platelets: 152 10*3/uL (ref 150–400)
RBC: 5.38 MIL/uL (ref 4.22–5.81)
RDW: 14.9 % (ref 11.5–15.5)
WBC: 5.7 10*3/uL (ref 4.0–10.5)

## 2017-05-30 LAB — BASIC METABOLIC PANEL
ANION GAP: 7 (ref 5–15)
BUN: 14 mg/dL (ref 6–20)
CHLORIDE: 105 mmol/L (ref 101–111)
CO2: 26 mmol/L (ref 22–32)
Calcium: 9.7 mg/dL (ref 8.9–10.3)
Creatinine, Ser: 1.2 mg/dL (ref 0.61–1.24)
GFR calc non Af Amer: 57 mL/min — ABNORMAL LOW (ref >60)
GLUCOSE: 120 mg/dL — AB (ref 65–99)
POTASSIUM: 3.9 mmol/L (ref 3.5–5.1)
Sodium: 138 mmol/L (ref 135–145)

## 2017-05-30 MED ORDER — SODIUM CHLORIDE 0.9 % IV BOLUS (SEPSIS)
500.0000 mL | Freq: Once | INTRAVENOUS | Status: AC
  Administered 2017-05-30: 500 mL via INTRAVENOUS

## 2017-05-30 NOTE — ED Notes (Signed)
Repeat EKG given to Dr. Mannie Stabile. Dr. Mannie Stabile sending patient to ER. Pt family member agreeable to take patient to the ER.

## 2017-05-30 NOTE — ED Notes (Signed)
Pt verbalized understanding discharge instructions and denies any further needs or questions at this time. VS stable, ambulatory and steady gait.   

## 2017-05-30 NOTE — ED Provider Notes (Signed)
[facility] EMERGENCY DEPARTMENT Provider Note   CSN: [id number] Arrival date & time: 05/30/17  1513     History   Chief Complaint Chief Complaint  Patient presents with  . Weakness  . Chest Pain    HPI Dustin Hamilton is a 76 y.o. male.  Patient is a 76 year old male with a history of hypertension, prior stroke and prior hypertensive cerebral hemorrhage who presents with chest pain and dizziness.  He states he was sitting in a car at [place] in the parking lot he had a sudden onset of diaphoresis and feeling really hot.  Took off his shirt.  He was having some achiness in the center of his chest.  He felt dizzy and lightheaded.  He states he felt like his heart was beating out of his chest.  He went home and took a shower.  His son finally convinced him to go to an urgent care where he was found to be hypotensive and tachycardic.  He was sent here for further evaluation.  He denies any known history of tachyarrhythmias.  He is on Plavix but no other anticoagulants.      Past Medical History:  Diagnosis Date  . BPH (benign prostatic hyperplasia)   . Cerebrovascular disease   . Congenital anomaly of diaphragm   . Elevated PSA   . Glaucoma, both eyes   . Hemorrhoid   . Hepatitis B surface antigen positive    02-20-2011  . History of adenomatous polyp of colon    2007, 2009 and 2013  tubular adenoma's  . History of alcohol abuse    quit 1963  . History of cerebral parenchymal hemorrhage    01/ 2006  left occiptial lobe related to hypertensive crisis  . History of CVA (cerebrovascular accident)    09-12-2012  left hippocampus/ amygdala junction and per MRI old white matter infarcts--  per pt residual short- term memory issues  . History of fatty infiltration of liver hx visit's at [facility] , last visit 05/ 2014   elvated LFT's ,  via liver bx 2004 related to hx alcohol and drug abuse (quit 1964)  . 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 HTN (hypertension)   . Renal cyst, left   . Stroke ([place])    hx of 3 strokes in past   . 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 Urethral lesion    urethral mass    Patient Active Problem List   Diagnosis Date Noted  . Dizziness 08/02/2014  . 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 CVA (cerebral infarction) 09/13/2012  . Vertigo 09/12/2012  . EAR PAIN 09/05/2009  . BACK PAIN 06/20/2009  . COLONIC POLYPS 05/06/2007  . 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 NONDEPENDENT ALCOHOL ABUSE IN REMISSION 05/06/2007  . HYPERTENSION, SEVERE 05/06/2007  . Hypertensive heart disease without heart failure 05/06/2007  . INTRACRANIAL HEMORRHAGE 05/06/2007  . Cerebrovascular disease 05/06/2007  . HEMORRHOIDS 05/06/2007  . INGUINAL HERNIA, RIGHT 05/06/2007  . RENAL CYST 05/06/2007  . HEMATURIA UNSPECIFIED 05/06/2007  . 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 PSA, INCREASED 05/06/2007  . LIVER FUNCTION TESTS, ABNORMAL 05/06/2007  . BPH (benign prostatic hypertrophy) with urinary obstruction 05/06/2007  . PERS HX NONCOMPLIANCE W/MED TX PRS HAZARDS HLTH 05/06/2007    Past Surgical History:  Procedure Laterality Date  . CARDIOVASCULAR STRESS TEST  05/05/2007   normal nuclear study w/  no ischemia/  normal LV fucntion and wall motion , ef60%  . COLONOSCOPY  last one 04-06-2012  . CYSTO/  LEFT RETROGRADE PYELOGRAM/ CYTOLOGY WASHINGS/  URETEROSCOPY  03/05/2000  . INGUINAL HERNIA REPAIR Bilateral 1965 and 1980's  . LAPAROSCOPIC INGUINAL HERNIA WITH UMBILICAL HERNIA Right 42/59/5638  . LIVER BIOPSY  1980's and 2004  . TRANSTHORACIC ECHOCARDIOGRAM  09/13/2012   moderate LVH,  ef 60-65%/    . TRANSURETHRAL RESECTION OF BLADDER TUMOR N/A 08/11/2016   Procedure: TRANSURETHRAL RESECTION OF BLADDER TUMOR (TURBT);  Surgeon: Cleon Gustin, MD;  Location[facility];  Service: Urology;  Laterality: N/A;       Home  Medications    Prior to Admission medications   Medication Sig Start Date End Date Taking? Authorizing Provider  amLODipine (NORVASC) 10 MG tablet TAKE ONE TABLET BY MOUTH EVERY DAY Patient taking differently: TAKE ONE TABLET (10MG ) BY MOUTH EVERY DAY   Yes Noralee Space, MD  carvedilol (COREG) 25 MG tablet Take 25 mg by mouth 2 (two) times daily with a meal.    Yes [provider]  clopidogrel (PLAVIX) 75 MG tablet Take 1 tablet (75 mg total) by mouth daily with breakfast. 05/06/13  Yes Noralee Space, MD  finasteride (PROSCAR) 5 MG tablet Take 5 mg by mouth daily.    Yes [provider]  furosemide (LASIX) 40 MG tablet Take 1 tablet (40 mg total) by mouth daily. 05/03/12  Yes Noralee Space, MD  hydrALAZINE (APRESOLINE) 50 MG tablet Take 50 mg by mouth 2 (two) times daily.    Yes [provider]  latanoprost (XALATAN) 0.005 % ophthalmic solution Place 1 drop into both eyes at bedtime.    Yes [provider]  tamsulosin (FLOMAX) 0.4 MG CAPS capsule Take 0.4 mg by mouth at bedtime.    Yes [provider]  traMADol (ULTRAM) 50 MG tablet Take 1 tablet (50 mg total) by mouth every 6 (six) hours as needed. Patient taking differently: Take 50 mg by mouth every 6 (six) hours as needed.  08/11/16  Yes McKenzie, Candee Furbish, MD  atorvastatin (LIPITOR) 40 MG tablet Take 40 mg by mouth at bedtime.  01/24/17   [provider]  hydrALAZINE (APRESOLINE) 25 MG tablet TAKE ONE TABLET BY MOUTH TWICE DAILY Patient not taking: Reported on 10/16/2016    Noralee Space, MD  tadalafil (CIALIS) 20 MG tablet Take 1 tablet (20 mg total) by mouth daily as needed for erectile dysfunction. Patient not taking: Reported on 02/04/2017 05/06/13   Noralee Space, MD    Family History Family History  Problem Relation Age of Onset  . Rheum arthritis Mother   . Diabetes Mother   . Stroke Mother   . Heart attack Mother   . Heart attack Father   . Heart disease Maternal  Grandmother   . Rheum arthritis Maternal Grandmother   . Diabetes Maternal Grandmother   . Stroke Maternal Grandmother   . Colon cancer Neg Hx     Social History Social History   Tobacco Use  . Smoking status: Former Smoker    Packs/day: 1.00    Years: 5.00    Pack years: 5.00    Types: Cigarettes    Last attempt to quit: 11/30/1981    Years since quitting: 35.5  . Smokeless tobacco: Never Used  Substance Use Topics  . Alcohol use: No    Alcohol/week: 0.0 oz    Comment: hx abuse -- quit:  1963  . Drug use: No    Comment: hx abuse -- quit 1964 (iv heroin and cocaine     Allergies   Penicillins   Review of Systems Review of Systems  Constitutional: Positive for diaphoresis and fatigue. Negative for chills and fever.  HENT: Negative for congestion, rhinorrhea and sneezing.   Eyes: Negative.   Respiratory: Negative for cough, chest tightness and shortness of breath.   Cardiovascular: Positive for chest pain. Negative for leg swelling.  Gastrointestinal: Negative for abdominal pain, blood in stool, diarrhea, nausea and vomiting.  Genitourinary: Negative for difficulty urinating, flank pain, frequency and hematuria.  Musculoskeletal: Negative for arthralgias and back pain.  Skin: Negative for rash.  Neurological: Positive for dizziness and light-headedness. Negative for speech difficulty, weakness, numbness and headaches.     Physical Exam Updated Vital Signs BP (!) 145/101   Pulse 74   Temp (!) 97.5 F (36.4 C) (Oral)   Resp (!) 27   SpO2 96%   Physical Exam  Constitutional: He is oriented to person, place, and time. He appears well-developed and well-nourished.  HENT:  Head: Normocephalic and atraumatic.  Eyes: Pupils are equal, round, and reactive to light.  Neck: Normal range of motion. Neck supple.  Cardiovascular: Regular rhythm and normal heart sounds. Tachycardia present.  Pulmonary/Chest: Effort normal and breath sounds normal. No respiratory distress.  He has no wheezes. He has no rales. He exhibits no tenderness.  Abdominal: Soft. Bowel sounds are normal. There is no tenderness. There is no rebound and no guarding.  Musculoskeletal: Normal range of motion. He exhibits no edema.  Lymphadenopathy:    He has no cervical adenopathy.  Neurological: He is alert and oriented to person, place, and time.  Skin: Skin is warm and dry. No rash noted.  Psychiatric: He has a normal mood and affect.     ED Treatments / Results  Labs (all labs ordered are listed, but only abnormal results are displayed) Labs Reviewed  BASIC METABOLIC PANEL - Abnormal; Notable for the following components:      Result Value   Glucose, Bld 120 (*)    GFR calc non Af Amer 57 (*)    All other components within normal limits  CBC - Abnormal; Notable for the following components:   Hemoglobin 17.2 (*)    All other components within normal limits  I-STAT TROPONIN, ED    EKG  EKG Interpretation  Date/Time:  Saturday May 30 2017 15:53:54 EST Ventricular Rate:  62 PR Interval:    QRS Duration: 112 QT Interval:  353 QTC Calculation: 359 R Axis:   -75 Text Interpretation:  Sinus rhythm Prolonged PR interval Incomplete RBBB and LAFB Anterior infarct, old Confirmed by Malvin Johns [phone]) on 05/30/2017 4:07:33 PM       Radiology Dg Chest 2 View  Result Date: 05/30/2017 CLINICAL DATA:  Chest pain and shortness of Breath EXAM: CHEST  2 VIEW COMPARISON:  09/25/2015 FINDINGS: Cardiac shadow is within normal limits. The lungs are well aerated bilaterally. Chronic elevation the right hemidiaphragm is seen. No focal infiltrate or sizable effusion is noted. Degenerative changes of the thoracic spine are again noted. IMPRESSION: Chronic changes without acute abnormality. Electronically Signed   By: Inez Catalina M.D.   On: 05/30/2017 16:27    Procedures Procedures (including critical care time)  Medications Ordered in ED Medications  sodium chloride 0.9 %  bolus 500 mL (0 mLs Intravenous Stopped 05/30/17 1627)     Initial Impression / Assessment and  Plan / ED Course  I have reviewed the triage vital signs and the nursing notes.  Pertinent labs & imaging results that were available during my care of the patient were reviewed by me and considered in my medical decision making (see chart for details).     Patient is a 76 year old male who presents with SVT and associated hypotension.  On arrival, modified Valsalva maneuver was performed with conversion to a sinus rhythm.  He had immediate improvement in blood pressure.  He was monitored for several hours and had no further symptoms.  He maintained a sinus rhythm.  He is asymptomatic.  I discussed this with the cardiologist on call, Dr. Alveta Heimlich, who feels that if the patient does want to go home, that it would be appropriate for him to have close follow-up with an electrophysiologist.  She will make a note that he will need to follow-up with an electrophysiologist.  I discussed this with the patient and he is adamant about wanting to go home.  He however is agreeable to come back if he has return of the symptoms.  He will call on Monday to get an appointment this week with cardiology.  Return precautions were given.  CRITICAL CARE Performed by: Malvin Johns Total critical care time: 35 minutes Critical care time was exclusive of separately billable procedures and treating other patients. Critical care was necessary to treat or prevent imminent or life-threatening deterioration. Critical care was time spent personally by me on the following activities: development of treatment plan with patient and/or surrogate as well as nursing, discussions with consultants, evaluation of patient's response to treatment, examination of patient, obtaining history from patient or surrogate, ordering and performing treatments and interventions, ordering and review of laboratory studies, ordering and review of  radiographic studies, pulse oximetry and re-evaluation of patient's condition.   Final Clinical Impressions(s) / ED Diagnoses   Final diagnoses:  SVT (supraventricular tachycardia) [facility])    ED Discharge Orders    None       Malvin Johns, MD 05/30/17 [phone]

## 2017-05-30 NOTE — ED Triage Notes (Signed)
Pt had sudden onset of chest pain today and dizziness, pt found at the store with most of his clothes off, pt is tachycardic and diaphoretic. HR 142. Dr. Mannie Stabile given EKG.

## 2017-05-30 NOTE — ED Provider Notes (Signed)
[place]   [id number] 05/30/17 Arrival Time: 6237  ASSESSMENT & PLAN:  1. Chest pain, unspecified type   2. Sinus tachycardia   / SVT  HR decreased to mid 80s after valsalva and carotid massage. Still with anterior chest discomfort.  To ED. Son is an EMT and is comfortable driving him over. Stable upon discharge. Copies of ECGs given.  Reviewed expectations re: course of current medical issues. Questions answered. Outlined signs and symptoms indicating need for more acute intervention. Patient verbalized understanding. After Visit Summary given.   SUBJECTIVE:  Dustin Hamilton is a 76 y.o. male who presents with complaint of:  Chest Pain: Onset abrupt, a few hours ago, with improving course since that time. Describes discomfort as intermittent, dull in nature, does not radiate. Patient rates pain as a 3/10 in intensity. Associated symptoms: "my heart feels like it's beating fast" and diaphoresis, "had to take my clothes off". Aggravating factors: none. Alleviating factors: none.  OTC treatment: None. No recent illnesses. No h/o similar reported. Mild nausea without vomiting. Ambulatory. No SOB reported.  ROS: As per HPI. All other systems negative.  OBJECTIVE:  Vitals:   05/30/17 1435 05/30/17 1442  BP: (!) 140/103   Pulse: 84 (!) 142  Resp: 18   Temp: (!) 97.3 F (36.3 C)   TempSrc: Oral   SpO2: 97%     General appearance: alert; no distress; diaphoretic Eyes: PERRLA; EOMI; conjunctiva normal HENT: normocephalic; atraumatic Neck: supple Lungs: clear to auscultation bilaterally Heart: initially tachycardic in the 140s Chest Wall: nontender Abdomen: soft, non-tender; bowel sounds normal; no masses or organomegaly; no guarding or rebound tenderness Extremities: no cyanosis or edema; symmetrical with no gross deformities Skin: warm and dry Psychological: alert and cooperative; normal mood and affect   Allergies  Allergen Reactions  . Penicillins  Hives    Has patient had a PCN reaction causing immediate rash, facial/tongue/throat swelling, SOB or lightheadedness with hypotension: Yes Has patient had a PCN reaction causing severe rash involving mucus membranes or skin necrosis: Yes Has patient had a PCN reaction that required hospitalization No Has patient had a PCN reaction occurring within the last 10 years: No If all of the above answers are "NO", then may proceed with Cephalosporin use.     Past Medical History:  Diagnosis Date  . BPH (benign prostatic hyperplasia)   . Cerebrovascular disease   . Congenital anomaly of diaphragm   . Elevated PSA   . Glaucoma, both eyes   . Hemorrhoid   . Hepatitis B surface antigen positive    02-20-2011  . History of adenomatous polyp of colon    2007, 2009 and 2013  tubular adenoma's  . History of alcohol abuse    quit 1963  . History of cerebral parenchymal hemorrhage    01/ 2006  left occiptial lobe related to hypertensive crisis  . History of CVA (cerebrovascular accident)    09-12-2012  left hippocampus/ amygdala junction and per MRI old white matter infarcts--  per pt residual short- term memory issues  . History of fatty infiltration of liver hx visit's at [facility] , last visit 05/ 2014   elvated LFT's ,  via liver bx 2004 related to hx alcohol and drug abuse (quit 1964)  . 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 HTN (hypertension)   . Renal cyst, left   . Stroke ([place])    hx of 3  strokes in past   . 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 Urethral lesion    urethral mass   Social History   Socioeconomic History  . Marital status: Married    Spouse name: [name]  . Number of children: 1  . Years of education: College  . Highest education level: Not on file  Social Needs  . Financial resource strain: Not on file  . Food insecurity - worry: Not on file  . Food insecurity - inability: Not on file  .  Transportation needs - medical: Not on file  . Transportation needs - non-medical: Not on file  Occupational History  . Occupation: Geophysicist/field seismologist  Tobacco Use  . Smoking status: Former Smoker    Packs/day: 1.00    Years: 5.00    Pack years: 5.00    Types: Cigarettes    Last attempt to quit: 11/30/1981    Years since quitting: 35.5  . Smokeless tobacco: Never Used  Substance and Sexual Activity  . Alcohol use: No    Alcohol/week: 0.0 oz    Comment: hx abuse -- quit:  1963  . 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 Sexual activity: Not on file  Other Topics Concern  . Not on file  Social History Narrative   Patient lives at home with his spouse.   Caffeine Use: 6-7 sodas daily    Family History  Problem Relation Age of Onset  . Rheum arthritis Mother   . Diabetes Mother   . Stroke Mother   . Heart attack Mother   . Heart attack Father   . Heart disease Maternal Grandmother   . Rheum arthritis Maternal Grandmother   . Diabetes Maternal Grandmother   . Stroke Maternal Grandmother   . Colon cancer Neg Hx    Past Surgical History:  Procedure Laterality Date  . CARDIOVASCULAR STRESS TEST  05/05/2007   normal nuclear study w/ no ischemia/  normal LV fucntion and wall motion , ef60%  . COLONOSCOPY  last one 04-06-2012  . CYSTO/  LEFT RETROGRADE PYELOGRAM/ CYTOLOGY WASHINGS/  URETEROSCOPY  03/05/2000  . INGUINAL HERNIA REPAIR Bilateral 1965 and 1980's  . LAPAROSCOPIC INGUINAL HERNIA WITH UMBILICAL HERNIA Right 53/61/4431  . LIVER BIOPSY  1980's and 2004  . TRANSTHORACIC ECHOCARDIOGRAM  09/13/2012   moderate LVH,  ef 60-65%/    . TRANSURETHRAL RESECTION OF BLADDER TUMOR N/A 08/11/2016   Procedure: TRANSURETHRAL RESECTION OF BLADDER TUMOR (TURBT);  Surgeon: Cleon Gustin, MD;  Location: [facility];  Service: Urology;  Laterality: N/AVanessa Kick, MD 06/01/17 [id number]

## 2017-05-30 NOTE — ED Triage Notes (Signed)
To ED from [facility] for eval of cp and weakness since this afternoon. Pt found to be hypotensive and tachycardic. Pt denies sob or nausea. Pt states he feels hot.

## 2017-06-03 ENCOUNTER — Ambulatory Visit: Payer: Medicare PPO | Admitting: Cardiovascular Disease

## 2017-06-03 ENCOUNTER — Encounter: Payer: Self-pay | Admitting: Cardiovascular Disease

## 2017-06-03 VITALS — BP 113/74 | HR 72 | Ht 71.0 in | Wt 182.0 lb

## 2017-06-03 DIAGNOSIS — I471 Supraventricular tachycardia, unspecified: Secondary | ICD-10-CM | POA: Insufficient documentation

## 2017-06-03 NOTE — Assessment & Plan Note (Signed)
Dustin Hamilton was referred by the ER for recent episode of PSVT. This occurred on 05/30/17 mm in a car outside the [place]. He went to the emergency room where he his heart rate was in the 150 range. This broke with Valsalva maneuver. He was hypotensive prior to regaining sinus rhythm. He was complaining of chest pain at that time. I am going to get a 2-D echo and exercise Myoview stress test to further evaluate.

## 2017-06-03 NOTE — Progress Notes (Signed)
06/03/2017 Dustin Hamilton   1941-04-28  [id number]  Primary Physician Dustin Lema, MD Primary Cardiologist: Dustin Harp MD Dustin Hamilton, [place]  HPI:  Dustin Hamilton is a 77 y.o. thin-appearing married African-American male father of one, grandfather of 2 grandchildren who is referred by the emergency room for evaluation of her recent episode of PSVT. His primary care provider is Dr. Dell Hamilton at the [facility]. He does have a history of treated hypertension. He's never had a heart attack or stroke. He has been his recent substernal chest pain over the last several months. He presented to the [facility] emergency room on 05/30/17 with PSVT and hypotension. This broke with typical Valsalva maneuver. He has had no recurrent episodes.   Current Meds  Medication Sig  . amLODipine (NORVASC) 10 MG tablet TAKE ONE TABLET BY MOUTH EVERY DAY  . carvedilol (COREG) 25 MG tablet Take 25 mg by mouth 2 (two) times daily with a meal.   . clopidogrel (PLAVIX) 75 MG tablet Take 1 tablet (75 mg total) by mouth daily with breakfast.  . finasteride (PROSCAR) 5 MG tablet Take 5 mg by mouth daily.   . furosemide (LASIX) 40 MG tablet Take 1 tablet (40 mg total) by mouth daily.  . hydrALAZINE (APRESOLINE) 50 MG tablet Take 50 mg by mouth 2 (two) times daily.   Marland Kitchen latanoprost (XALATAN) 0.005 % ophthalmic solution Place 1 drop into both eyes at bedtime.   . tadalafil (CIALIS) 20 MG tablet Take 1 tablet (20 mg total) by mouth daily as needed for erectile dysfunction.  . tamsulosin (FLOMAX) 0.4 MG CAPS capsule Take 0.4 mg by mouth at bedtime.   . traMADol (ULTRAM) 50 MG tablet Take 1 tablet (50 mg total) by mouth every 6 (six) hours as needed. (Patient taking differently: Take 50 mg by mouth every 6 (six) hours as needed. )     Allergies  Allergen Reactions  . Penicillins Hives    Has patient had a PCN reaction causing immediate rash, facial/tongue/throat  swelling, SOB or lightheadedness with hypotension: Yes Has patient had a PCN reaction causing severe rash involving mucus membranes or skin necrosis: Yes Has patient had a PCN reaction that required hospitalization No Has patient had a PCN reaction occurring within the last 10 years: No If all of the above answers are "NO", then may proceed with Cephalosporin use.     Social History   Socioeconomic History  . Marital status: Married    Spouse name: Dustin Hamilton  . Number of children: 1  . Years of education: College  . Highest education level: Not on file  Social Needs  . Financial resource strain: Not on file  . Food insecurity - worry: Not on file  . Food insecurity - inability: Not on file  . Transportation needs - medical: Not on file  . Transportation needs - non-medical: Not on file  Occupational History  . Occupation: Geophysicist/field seismologist  Tobacco Use  . Smoking status: Former Smoker    Packs/day: 1.00    Years: 5.00    Pack years: 5.00    Types: Cigarettes    Last attempt to quit: 11/30/1981    Years since quitting: 35.5  . Smokeless tobacco: Never Used  Substance and Sexual Activity  . Alcohol use: No    Alcohol/week: 0.0 oz    Comment: hx abuse -- quit:  1963  . Drug use: No    Comment: hx abuse --  quit 1964 (iv heroin and cocaine  . Sexual activity: Not on file  Other Topics Concern  . Not on file  Social History Narrative   Patient lives at home with his spouse.   Caffeine Use: 6-7 sodas daily      Review of Systems: General: negative for chills, fever, night sweats or weight changes.  Cardiovascular: negative for chest pain, dyspnea on exertion, edema, orthopnea, palpitations, paroxysmal nocturnal dyspnea or shortness of breath Dermatological: negative for rash Respiratory: negative for cough or wheezing Urologic: negative for hematuria Abdominal: negative for nausea, vomiting, diarrhea, bright red blood per rectum, melena, or hematemesis Neurologic: negative for  visual changes, syncope, or dizziness All other systems reviewed and are otherwise negative except as noted above.    Blood pressure 113/74, pulse 72, height 5\' 11"  (1.803 m), weight 182 lb (82.6 kg).  General appearance: alert and no distress Neck: no adenopathy, no carotid bruit, no JVD, supple, symmetrical, trachea midline and thyroid not enlarged, symmetric, no tenderness/mass/nodules Lungs: clear to auscultation bilaterally Heart: regular rate and rhythm, S1, S2 normal, no murmur, click, rub or gallop Extremities: extremities normal, atraumatic, no cyanosis or edema Pulses: 2+ and symmetric Skin: Skin color, texture, turgor normal. No rashes or lesions Neurologic: Alert and oriented X 3, normal strength and tone. Normal symmetric reflexes. Normal coordination and gait  EKG not performed today  ASSESSMENT AND PLAN:   PSVT (paroxysmal supraventricular tachycardia) [facility]) [name] was referred by the ER for recent episode of PSVT. This occurred on 05/30/17 mm in a car outside the [place]. He went to the emergency room where he his heart rate was in the 150 range. This broke with Valsalva maneuver. He was hypotensive prior to regaining sinus rhythm. He was complaining of chest pain at that time. I am going to get a 2-D echo and exercise Myoview stress test to further evaluate.  HYPERTENSION, SEVERE History of severe hypertension with blood pressure measures 113/74. He is on carvedilol, amlodipine and hydralazine. Continue current meds at current dosing.      Dustin Harp MD FACP,FACC,FAHA, [facility] 06/03/2017 11:38 AM

## 2017-06-03 NOTE — Assessment & Plan Note (Signed)
History of severe hypertension with blood pressure measures 113/74. He is on carvedilol, amlodipine and hydralazine. Continue current meds at current dosing.

## 2017-06-03 NOTE — Patient Instructions (Signed)
Medication Instructions: Your physician recommends that you continue on your current medications as directed. Please refer to the Current Medication list given to you today.  Testing/Procedures: Your physician has requested that you have an echocardiogram. Echocardiography is a painless test that uses sound waves to create images of your heart. It provides your doctor with information about the size and shape of your heart and how well your heart's chambers and valves are working. This procedure takes approximately one hour. There are no restrictions for this procedure.  Your physician has requested that you have an exercise stress myoview. For further information please visit [email or web address]. Please follow instruction sheet, as given.  Follow-Up: Your physician recommends that you schedule a follow-up appointment as needed with Dr. Gwenlyn Found.

## 2017-06-09 ENCOUNTER — Other Ambulatory Visit: Payer: Self-pay

## 2017-06-09 ENCOUNTER — Ambulatory Visit (HOSPITAL_COMMUNITY): Payer: Medicare PPO | Attending: Cardiology

## 2017-06-09 DIAGNOSIS — R079 Chest pain, unspecified: Secondary | ICD-10-CM | POA: Insufficient documentation

## 2017-06-09 DIAGNOSIS — I10 Essential (primary) hypertension: Secondary | ICD-10-CM | POA: Diagnosis not present

## 2017-06-09 DIAGNOSIS — Z8673 Personal history of transient ischemic attack (TIA), and cerebral infarction without residual deficits: Secondary | ICD-10-CM | POA: Diagnosis not present

## 2017-06-09 DIAGNOSIS — I471 Supraventricular tachycardia: Secondary | ICD-10-CM | POA: Insufficient documentation

## 2017-06-10 ENCOUNTER — Telehealth (HOSPITAL_COMMUNITY): Payer: Self-pay

## 2017-06-10 NOTE — Telephone Encounter (Signed)
Encounter complete. 

## 2017-06-11 ENCOUNTER — Ambulatory Visit (HOSPITAL_COMMUNITY)
Admission: EM | Admit: 2017-06-11 | Discharge: 2017-06-11 | Disposition: A | Discharge status: Home / Self Care | Payer: Medicare PPO | Attending: Family Medicine | Admitting: Family Medicine

## 2017-06-11 ENCOUNTER — Other Ambulatory Visit: Payer: Self-pay

## 2017-06-11 ENCOUNTER — Encounter (HOSPITAL_COMMUNITY): Payer: Self-pay | Admitting: Emergency Medicine

## 2017-06-11 DIAGNOSIS — M654 Radial styloid tenosynovitis [de Quervain]: Secondary | ICD-10-CM

## 2017-06-11 DIAGNOSIS — M25531 Pain in right wrist: Secondary | ICD-10-CM

## 2017-06-11 NOTE — ED Triage Notes (Signed)
Right wrist pain that started today.  Patient has pain and swelling.  Denies any injury

## 2017-06-11 NOTE — Discharge Instructions (Addendum)
Wear the splint you were given as much as possible in addition to taking ibuprofen for the next several days.

## 2017-06-12 ENCOUNTER — Ambulatory Visit (HOSPITAL_COMMUNITY)
Admission: RE | Admit: 2017-06-12 | Discharge: 2017-06-12 | Disposition: A | Discharge status: Home / Self Care | Payer: Medicare PPO | Source: Ambulatory Visit | Attending: Cardiovascular Disease | Admitting: Cardiovascular Disease

## 2017-06-12 ENCOUNTER — Ambulatory Visit: Payer: Medicare PPO | Admitting: Internal Medicine

## 2017-06-12 ENCOUNTER — Encounter: Payer: Self-pay | Admitting: Internal Medicine

## 2017-06-12 VITALS — BP 122/74 | HR 77 | Ht 71.0 in | Wt 184.2 lb

## 2017-06-12 DIAGNOSIS — I471 Supraventricular tachycardia: Secondary | ICD-10-CM | POA: Diagnosis not present

## 2017-06-12 LAB — MYOCARDIAL PERFUSION IMAGING
CHL CUP NUCLEAR SSS: 4
LVDIAVOL: 116 mL (ref 62–150)
LVSYSVOL: 43 mL
Peak HR: 112 {beats}/min
Rest HR: 55 {beats}/min
SDS: 3
SRS: 1
TID: 1.07

## 2017-06-12 MED ORDER — TECHNETIUM TC 99M TETROFOSMIN IV KIT
29.7000 | PACK | Freq: Once | INTRAVENOUS | Status: AC | PRN
  Administered 2017-06-12: 29.7 via INTRAVENOUS
  Filled 2017-06-12: qty 30

## 2017-06-12 MED ORDER — REGADENOSON 0.4 MG/5ML IV SOLN
0.4000 mg | Freq: Once | INTRAVENOUS | Status: AC
  Administered 2017-06-12: 0.4 mg via INTRAVENOUS

## 2017-06-12 MED ORDER — TECHNETIUM TC 99M TETROFOSMIN IV KIT
10.5000 | PACK | Freq: Once | INTRAVENOUS | Status: AC | PRN
  Administered 2017-06-12: 10.5 via INTRAVENOUS
  Filled 2017-06-12: qty 11

## 2017-06-12 NOTE — Progress Notes (Signed)
HPI Mr. Goodrich is referred today by Dr. Gwenlyn Found for evaluation of SVT. He is a pleasant 77 yo man who has a h/o HTN. He was seen in the ED with SVT at 130/min. This was a short RP tachycardia. The patient has had no recurrent episodes. In SVT he gets sob and chest pressure. No edema. He feels well.  Allergies  Allergen Reactions  . Penicillins Hives    Has patient had a PCN reaction causing immediate rash, facial/tongue/throat swelling, SOB or lightheadedness with hypotension: Yes Has patient had a PCN reaction causing severe rash involving mucus membranes or skin necrosis: Yes Has patient had a PCN reaction that required hospitalization No Has patient had a PCN reaction occurring within the last 10 years: No If all of the above answers are "NO", then may proceed with Cephalosporin use.      Current Outpatient Medications  Medication Sig Dispense Refill  . amLODipine (NORVASC) 10 MG tablet TAKE ONE TABLET BY MOUTH EVERY DAY 90 tablet 0  . carvedilol (COREG) 25 MG tablet Take 25 mg by mouth 2 (two) times daily with a meal.     . clopidogrel (PLAVIX) 75 MG tablet Take 1 tablet (75 mg total) by mouth daily with breakfast. 30 tablet 11  . finasteride (PROSCAR) 5 MG tablet Take 5 mg by mouth daily.     . furosemide (LASIX) 40 MG tablet Take 1 tablet (40 mg total) by mouth daily. 90 tablet 3  . hydrALAZINE (APRESOLINE) 50 MG tablet Take 50 mg by mouth 2 (two) times daily.     Marland Kitchen latanoprost (XALATAN) 0.005 % ophthalmic solution Place 1 drop into both eyes at bedtime.     . tadalafil (CIALIS) 20 MG tablet Take 1 tablet (20 mg total) by mouth daily as needed for erectile dysfunction. 10 tablet 6  . tamsulosin (FLOMAX) 0.4 MG CAPS capsule Take 0.4 mg by mouth at bedtime.     . traMADol (ULTRAM) 50 MG tablet Take 1 tablet (50 mg total) by mouth every 6 (six) hours as needed. (Patient taking differently: Take 50 mg by mouth every 6 (six) hours as needed. ) 30 tablet 0   No current  facility-administered medications for this visit.      Past Medical History:  Diagnosis Date  . BPH (benign prostatic hyperplasia)   . Cerebrovascular disease   . Congenital anomaly of diaphragm   . Elevated PSA   . Glaucoma, both eyes   . Hemorrhoid   . Hepatitis B surface antigen positive    02-20-2011  . History of adenomatous polyp of colon    2007, 2009 and 2013  tubular adenoma's  . History of alcohol abuse    quit 1963  . History of cerebral parenchymal hemorrhage    01/ 2006  left occiptial lobe related to hypertensive crisis  . History of CVA (cerebrovascular accident)    09-12-2012  left hippocampus/ amygdala junction and per MRI old white matter infarcts--  per pt residual short- term memory issues  . History of fatty infiltration of liver hx visit's at [facility] , last visit 05/ 2014   elvated LFT's ,  via liver bx 2004 related to hx alcohol and drug abuse (quit 1964)  . 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 HTN (hypertension)   . Renal cyst, left   . Stroke ([place])    hx of 3 strokes in past   .  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 Urethral lesion    urethral mass    ROS:   All systems reviewed and negative except as noted in the HPI.   Past Surgical History:  Procedure Laterality Date  . CARDIOVASCULAR STRESS TEST  05/05/2007   normal nuclear study w/ no ischemia/  normal LV fucntion and wall motion , ef60%  . COLONOSCOPY  last one 04-06-2012  . CYSTO/  LEFT RETROGRADE PYELOGRAM/ CYTOLOGY WASHINGS/  URETEROSCOPY  03/05/2000  . INGUINAL HERNIA REPAIR Bilateral 1965 and 1980's  . LAPAROSCOPIC INGUINAL HERNIA WITH UMBILICAL HERNIA Right 09/73/5329  . LIVER BIOPSY  1980's and 2004  . TRANSTHORACIC ECHOCARDIOGRAM  09/13/2012   moderate LVH,  ef 60-65%/    . TRANSURETHRAL RESECTION OF BLADDER TUMOR N/A 08/11/2016   Procedure: TRANSURETHRAL RESECTION OF BLADDER TUMOR (TURBT);  Surgeon: Cleon Gustin, MD;  Location: [facility];  Service: Urology;  Laterality: N/A;     Family History  Problem Relation Age of Onset  . Rheum arthritis Mother   . Diabetes Mother   . Stroke Mother   . Heart attack Mother   . Heart attack Father   . Heart disease Maternal Grandmother   . Rheum arthritis Maternal Grandmother   . Diabetes Maternal Grandmother   . Stroke Maternal Grandmother   . Colon cancer Neg Hx      Social History   Socioeconomic History  . Marital status: Married    Spouse name: [name]  . Number of children: 1  . Years of education: College  . Highest education level: Not on file  Social Needs  . Financial resource strain: Not on file  . Food insecurity - worry: Not on file  . Food insecurity - inability: Not on file  . Transportation needs - medical: Not on file  . Transportation needs - non-medical: Not on file  Occupational History  . Occupation: Geophysicist/field seismologist  Tobacco Use  . Smoking status: Former Smoker    Packs/day: 1.00    Years: 5.00    Pack years: 5.00    Types: Cigarettes    Last attempt to quit: 11/30/1981    Years since quitting: 35.5  . Smokeless tobacco: Never Used  Substance and Sexual Activity  . Alcohol use: No    Alcohol/week: 0.0 oz    Comment: hx abuse -- quit:  1963  . 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 Sexual activity: Not on file  Other Topics Concern  . Not on file  Social History Narrative   Patient lives at home with his spouse.   Caffeine Use: 6-7 sodas daily      BP 122/74   Pulse 77   Ht 5\' 11"  (1.803 m)   Wt 184 lb 3.2 oz (83.6 kg)   SpO2 95%   BMI 25.69 kg/m   Physical Exam:  Well appearing 77 yo man, NAD HEENT: Unremarkable Neck:  No JVD, no thyromegally Lymphatics:  No adenopathy Back:  No CVA tenderness Lungs:  Clear HEART:  Regular rate rhythm, no murmurs, no rubs, no clicks Abd:  soft, positive bowel sounds, no organomegally, no rebound, no  guarding Ext:  2 plus pulses, no edema, no cyanosis, no clubbing Skin:  No rashes no nodules Neuro:  CN II through XII intact, motor grossly intact  EKG reviewed - NSR  DEVICE  Normal device function.  See PaceArt for details.  Assess/Plan: 1. SVT - He has only had a single episode. I have reviewed the treatment options with the patient and recommended a period of watchful waiting. If he has more SVT, then catheter ablation would be appropriate.  2. HTN - he is on good medical therapy with beta blockers and amlodipine. Will follow.  Mikle Bosworth.D.

## 2017-06-12 NOTE — Patient Instructions (Signed)
Medication Instructions:  Your physician recommends that you continue on your current medications as directed. Please refer to the Current Medication list given to you today.  Labwork: None ordered.  Testing/Procedures: Your physician has recommended that you have an ablation. Catheter ablation is a medical procedure used to treat some cardiac arrhythmias (irregular heartbeats). During catheter ablation, a long, thin, flexible tube is put into a blood vessel in your groin (upper thigh), or neck. This tube is called an ablation catheter. It is then guided to your heart through the blood vessel. Radio frequency waves destroy small areas of heart tissue where abnormal heartbeats may cause an arrhythmia to start. Please see the instruction sheet given to you today.  Follow-Up: Call us if your heart racing returns.  Any Other Special Instructions Will Be Listed Below (If Applicable).  If you need a refill on your cardiac medications before your next appointment, please call your pharmacy.

## 2017-06-16 NOTE — ED Provider Notes (Signed)
[name]   [id number] 06/11/17 Arrival Time: 1618  ASSESSMENT & PLAN:  1. Right wrist pain   2. Tenosynovitis, de Quervain    Prefers OTC analgesics. Thumb spica splint. Will f/u with PCP if not improving over the next week.  Reviewed expectations re: course of current medical issues. Questions answered. Outlined signs and symptoms indicating need for more acute intervention. Patient verbalized understanding. After Visit Summary given.  SUBJECTIVE: History from: patient.  Dustin Hamilton is a 77 y.o. male who reports:  Pain/Discomfort of: right wrist near thumb Frequency: intermittent (a few seconds to minute then resolves) Onset gradual, approximately a few days ago; worse today Injury/trama: no. Describes as: aching and sharp without radiation Severity: moderate when present Progression: stable Relieved by: "not using my wrist" Worsened by: movement Associated symptoms: none reported Extremity sensation changes or weakness: none. Self treatment: has not tried OTCs for relief of pain.  History of similar: no  ROS: As per HPI.   OBJECTIVE:  Vitals:   06/11/17 1659  BP: (!) 169/96  Pulse: 66  Resp: (!) 22  Temp: 98.4 F (36.9 C)  TempSrc: Oral  SpO2: 97%    General appearance: alert; no distress Extremities: no cyanosis or edema; symmetrical with no gross deformities; pain reported over radial side of wrist, more notable with thumb and wrist movement; no erythema or inflammation; no swelling; FROM; very tender over radial styloid CV: normal extremity capillary refill Skin: warm and dry Neurologic: normal gait; normal symmetric reflexes in all extremities; normal sensation in all extremities Psychological: alert and cooperative; normal mood and affect   Allergies  Allergen Reactions  . Penicillins Hives    Has patient had a PCN reaction causing immediate rash, facial/tongue/throat swelling, SOB or lightheadedness with hypotension:  Yes Has patient had a PCN reaction causing severe rash involving mucus membranes or skin necrosis: Yes Has patient had a PCN reaction that required hospitalization No Has patient had a PCN reaction occurring within the last 10 years: No If all of the above answers are "NO", then may proceed with Cephalosporin use.     Past Medical History:  Diagnosis Date  . BPH (benign prostatic hyperplasia)   . Cerebrovascular disease   . Congenital anomaly of diaphragm   . Elevated PSA   . Glaucoma, both eyes   . Hemorrhoid   . Hepatitis B surface antigen positive    02-20-2011  . History of adenomatous polyp of colon    2007, 2009 and 2013  tubular adenoma's  . History of alcohol abuse    quit 1963  . History of cerebral parenchymal hemorrhage    01/ 2006  left occiptial lobe related to hypertensive crisis  . History of CVA (cerebrovascular accident)    09-12-2012  left hippocampus/ amygdala junction and per MRI old white matter infarcts--  per pt residual short- term memory issues  . History of fatty infiltration of liver hx visit's at [facility] , last visit 05/ 2014   elvated LFT's ,  via liver bx 2004 related to hx alcohol and drug abuse (quit 1964)  . 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 HTN (hypertension)   . Renal cyst, left   . Stroke ([place])    hx of 3 strokes in past   . 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 Urethral lesion    urethral mass   Social History  Socioeconomic History  . Marital status: Married    Spouse name: [name]  . Number of children: 1  . Years of education: College  . Highest education level: Not on file  Social Needs  . Financial resource strain: Not on file  . Food insecurity - worry: Not on file  . Food insecurity - inability: Not on file  . Transportation needs - medical: Not on file  . Transportation needs - non-medical: Not on file  Occupational History  . Occupation:  Geophysicist/field seismologist  Tobacco Use  . Smoking status: Former Smoker    Packs/day: 1.00    Years: 5.00    Pack years: 5.00    Types: Cigarettes    Last attempt to quit: 11/30/1981    Years since quitting: 35.5  . Smokeless tobacco: Never Used  Substance and Sexual Activity  . Alcohol use: No    Alcohol/week: 0.0 oz    Comment: hx abuse -- quit:  1963  . 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 Sexual activity: Not on file  Other Topics Concern  . Not on file  Social History Narrative   Patient lives at home with his spouse.   Caffeine Use: 6-7 sodas daily    Family History  Problem Relation Age of Onset  . Rheum arthritis Mother   . Diabetes Mother   . Stroke Mother   . Heart attack Mother   . Heart attack Father   . Heart disease Maternal Grandmother   . Rheum arthritis Maternal Grandmother   . Diabetes Maternal Grandmother   . Stroke Maternal Grandmother   . Colon cancer Neg Hx    Past Surgical History:  Procedure Laterality Date  . CARDIOVASCULAR STRESS TEST  05/05/2007   normal nuclear study w/ no ischemia/  normal LV fucntion and wall motion , ef60%  . COLONOSCOPY  last one 04-06-2012  . CYSTO/  LEFT RETROGRADE PYELOGRAM/ CYTOLOGY WASHINGS/  URETEROSCOPY  03/05/2000  . INGUINAL HERNIA REPAIR Bilateral 1965 and 1980's  . LAPAROSCOPIC INGUINAL HERNIA WITH UMBILICAL HERNIA Right 27/78/2423  . LIVER BIOPSY  1980's and 2004  . TRANSTHORACIC ECHOCARDIOGRAM  09/13/2012   moderate LVH,  ef 60-65%/    . TRANSURETHRAL RESECTION OF BLADDER TUMOR N/A 08/11/2016   Procedure: TRANSURETHRAL RESECTION OF BLADDER TUMOR (TURBT);  Surgeon: Cleon Gustin, MD;  Location: [facility];  Service: Urology;  Laterality: N/AVanessa Kick, MD 06/16/17 1015

## 2017-07-06 ENCOUNTER — Encounter (HOSPITAL_COMMUNITY): Payer: Self-pay

## 2017-07-06 ENCOUNTER — Other Ambulatory Visit: Payer: Self-pay

## 2017-07-06 ENCOUNTER — Emergency Department (HOSPITAL_COMMUNITY)
Admission: EM | Admit: 2017-07-06 | Discharge: 2017-07-07 | Disposition: A | Discharge status: Home / Self Care | Payer: Medicare PPO | Attending: Emergency Medicine | Admitting: Emergency Medicine

## 2017-07-06 DIAGNOSIS — Z7902 Long term (current) use of antithrombotics/antiplatelets: Secondary | ICD-10-CM | POA: Diagnosis not present

## 2017-07-06 DIAGNOSIS — Z87891 Personal history of nicotine dependence: Secondary | ICD-10-CM | POA: Insufficient documentation

## 2017-07-06 DIAGNOSIS — I10 Essential (primary) hypertension: Secondary | ICD-10-CM | POA: Insufficient documentation

## 2017-07-06 DIAGNOSIS — R42 Dizziness and giddiness: Secondary | ICD-10-CM | POA: Diagnosis present

## 2017-07-06 DIAGNOSIS — Z79899 Other long term (current) drug therapy: Secondary | ICD-10-CM | POA: Insufficient documentation

## 2017-07-06 DIAGNOSIS — Z8673 Personal history of transient ischemic attack (TIA), and cerebral infarction without residual deficits: Secondary | ICD-10-CM | POA: Insufficient documentation

## 2017-07-06 DIAGNOSIS — H66001 Acute suppurative otitis media without spontaneous rupture of ear drum, right ear: Secondary | ICD-10-CM | POA: Insufficient documentation

## 2017-07-06 LAB — BASIC METABOLIC PANEL
ANION GAP: 11 (ref 5–15)
BUN: 13 mg/dL (ref 6–20)
CHLORIDE: 102 mmol/L (ref 101–111)
CO2: 25 mmol/L (ref 22–32)
Calcium: 9.7 mg/dL (ref 8.9–10.3)
Creatinine, Ser: 1.06 mg/dL (ref 0.61–1.24)
GFR calc Af Amer: >60 mL/min (ref >60)
Glucose, Bld: 102 mg/dL — ABNORMAL HIGH (ref 65–99)
POTASSIUM: 3.9 mmol/L (ref 3.5–5.1)
SODIUM: 138 mmol/L (ref 135–145)

## 2017-07-06 LAB — CBC
HEMATOCRIT: 48.5 % (ref 39.0–52.0)
HEMOGLOBIN: 16.9 g/dL (ref 13.0–17.0)
MCH: 32.1 pg (ref 26.0–34.0)
MCHC: 34.8 g/dL (ref 30.0–36.0)
MCV: 92.2 fL (ref 78.0–100.0)
Platelets: 149 10*3/uL — ABNORMAL LOW (ref 150–400)
RBC: 5.26 MIL/uL (ref 4.22–5.81)
RDW: 14.2 % (ref 11.5–15.5)
WBC: 5.5 10*3/uL (ref 4.0–10.5)

## 2017-07-06 MED ORDER — CEFUROXIME AXETIL 500 MG PO TABS: 500.0000 mg | ORAL_TABLET | Freq: Two times a day (BID) | ORAL | 0 refills | Status: DC

## 2017-07-06 MED ORDER — CEFUROXIME AXETIL 500 MG PO TABS
500.0000 mg | ORAL_TABLET | Freq: Once | ORAL | Status: AC
  Administered 2017-07-07: 500 mg via ORAL
  Filled 2017-07-06: qty 1

## 2017-07-06 MED ORDER — OXYCODONE-ACETAMINOPHEN 5-325 MG PO TABS
1.0000 | ORAL_TABLET | Freq: Once | ORAL | Status: AC
  Administered 2017-07-07: 1 via ORAL
  Filled 2017-07-06: qty 1

## 2017-07-06 MED ORDER — OXYCODONE-ACETAMINOPHEN 5-325 MG PO TABS: 1.0000 | ORAL_TABLET | ORAL | 0 refills | Status: DC | PRN

## 2017-07-06 NOTE — ED Provider Notes (Signed)
[facility] EMERGENCY DEPARTMENT Provider Note   CSN: [id number] Arrival date & time: 07/06/17  1454     History   Chief Complaint Chief Complaint  Patient presents with  . Dizziness    HPI Dustin Hamilton is a 77 y.o. male.  The history is provided by the patient.  Dizziness  He has history of hypertension, stroke, paroxysmal SVT and comes in with onset this morning of pain in his right ear.  He denies any difficulty hearing and denies any fullness in his ear.  He did have chills and sweats but was not aware of any fever.  He denies rhinorrhea or sore throat.  There is been a slight cough which is nonproductive.  There is been no vomiting or diarrhea.  No other pain.  He rates pain at 10/10.  He has not done anything to treat it.  He does have a history of vertigo and has noted some mild dizziness intermittently during the day.    Past Medical History:  Diagnosis Date  . BPH (benign prostatic hyperplasia)   . Cerebrovascular disease   . Congenital anomaly of diaphragm   . Elevated PSA   . Glaucoma, both eyes   . Hemorrhoid   . Hepatitis B surface antigen positive    02-20-2011  . History of adenomatous polyp of colon    2007, 2009 and 2013  tubular adenoma's  . History of alcohol abuse    quit 1963  . History of cerebral parenchymal hemorrhage    01/ 2006  left occiptial lobe related to hypertensive crisis  . History of CVA (cerebrovascular accident)    09-12-2012  left hippocampus/ amygdala junction and per MRI old white matter infarcts--  per pt residual short- term memory issues  . History of fatty infiltration of liver hx visit's at [facility] , last visit 05/ 2014   elvated LFT's ,  via liver bx 2004 related to hx alcohol and drug abuse (quit 1964)  . 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 HTN (hypertension)   . Renal cyst, left   . Stroke ([place])    hx of 3 strokes in past   . Unspecified hypertensive heart  disease without heart failure   . Urethral lesion    urethral mass    Patient Active Problem List   Diagnosis Date Noted  . PSVT (paroxysmal supraventricular tachycardia) ([place]) 06/03/2017  . Dizziness 08/02/2014  . 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 CVA (cerebral infarction) 09/13/2012  . Vertigo 09/12/2012  . EAR PAIN 09/05/2009  . BACK PAIN 06/20/2009  . COLONIC POLYPS 05/06/2007  . 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 NONDEPENDENT ALCOHOL ABUSE IN REMISSION 05/06/2007  . HYPERTENSION, SEVERE 05/06/2007  . Hypertensive heart disease without heart failure 05/06/2007  . INTRACRANIAL HEMORRHAGE 05/06/2007  . Cerebrovascular disease 05/06/2007  . HEMORRHOIDS 05/06/2007  . INGUINAL HERNIA, RIGHT 05/06/2007  . RENAL CYST 05/06/2007  . HEMATURIA UNSPECIFIED 05/06/2007  . 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 PSA, INCREASED 05/06/2007  . LIVER FUNCTION TESTS, ABNORMAL 05/06/2007  . BPH (benign prostatic hypertrophy) with urinary obstruction 05/06/2007  . PERS HX NONCOMPLIANCE W/MED TX PRS HAZARDS HLTH 05/06/2007    Past Surgical History:  Procedure Laterality Date  . CARDIOVASCULAR STRESS TEST  05/05/2007   normal nuclear study w/ no ischemia/  normal LV fucntion and wall motion , ef60%  . COLONOSCOPY  last one 04-06-2012  .  CYSTO/  LEFT RETROGRADE PYELOGRAM/ CYTOLOGY WASHINGS/  URETEROSCOPY  03/05/2000  . INGUINAL HERNIA REPAIR Bilateral 1965 and 1980's  . LAPAROSCOPIC INGUINAL HERNIA WITH UMBILICAL HERNIA Right 62/70/3500  . LIVER BIOPSY  1980's and 2004  . TRANSTHORACIC ECHOCARDIOGRAM  09/13/2012   moderate LVH,  ef 60-65%/    . TRANSURETHRAL RESECTION OF BLADDER TUMOR N/A 08/11/2016   Procedure: TRANSURETHRAL RESECTION OF BLADDER TUMOR (TURBT);  Surgeon: Cleon Gustin, MD;  Location: [facility];  Service: Urology;  Laterality: N/A;       Home Medications    Prior to Admission medications   Medication Sig Start Date End Date  Taking? Authorizing Provider  amLODipine (NORVASC) 10 MG tablet TAKE ONE TABLET BY MOUTH EVERY DAY    Noralee Space, MD  carvedilol (COREG) 25 MG tablet Take 25 mg by mouth 2 (two) times daily with a meal.     [provider]  cefUROXime (CEFTIN) 500 MG tablet Take 1 tablet (500 mg total) by mouth 2 (two) times daily with a meal. 02/03/80   Delora Fuel, MD  clopidogrel (PLAVIX) 75 MG tablet Take 1 tablet (75 mg total) by mouth daily with breakfast. 05/06/13   Noralee Space, MD  finasteride (PROSCAR) 5 MG tablet Take 5 mg by mouth daily.     [provider]  furosemide (LASIX) 40 MG tablet Take 1 tablet (40 mg total) by mouth daily. 05/03/12   Noralee Space, MD  hydrALAZINE (APRESOLINE) 50 MG tablet Take 50 mg by mouth 2 (two) times daily.     [provider]  latanoprost (XALATAN) 0.005 % ophthalmic solution Place 1 drop into both eyes at bedtime.     [provider]  oxyCODONE-acetaminophen (PERCOCET) 5-325 MG tablet Take 1 tablet by mouth every 4 (four) hours as needed for moderate pain. 01/01/98   Delora Fuel, MD  tadalafil (CIALIS) 20 MG tablet Take 1 tablet (20 mg total) by mouth daily as needed for erectile dysfunction. 05/06/13   Noralee Space, MD  tamsulosin (FLOMAX) 0.4 MG CAPS capsule Take 0.4 mg by mouth at bedtime.     [provider]  traMADol (ULTRAM) 50 MG tablet Take 1 tablet (50 mg total) by mouth every 6 (six) hours as needed. Patient taking differently: Take 50 mg by mouth every 6 (six) hours as needed.  08/11/16   McKenzie, Candee Furbish, MD    Family History Family History  Problem Relation Age of Onset  . Rheum arthritis Mother   . Diabetes Mother   . Stroke Mother   . Heart attack Mother   . Heart attack Father   . Heart disease Maternal Grandmother   . Rheum arthritis Maternal Grandmother   . Diabetes Maternal Grandmother   . Stroke Maternal Grandmother   . Colon cancer Neg Hx     Social History Social History   Tobacco  Use  . Smoking status: Former Smoker    Packs/day: 1.00    Years: 5.00    Pack years: 5.00    Types: Cigarettes    Last attempt to quit: 11/30/1981    Years since quitting: 35.6  . Smokeless tobacco: Never Used  Substance Use Topics  . Alcohol use: No    Alcohol/week: 0.0 oz    Comment: hx abuse -- quit:  1963  . Drug use: No    Comment: hx abuse -- quit 1964 (iv heroin and cocaine     Allergies   Penicillins  Review of Systems Review of Systems  Neurological: Positive for dizziness.  All other systems reviewed and are negative.    Physical Exam Updated Vital Signs BP (!) 153/102   Pulse 74   Temp (!) 100.4 F (38 C)   Resp (!) 23   Ht 5\' 11"  (1.803 m)   Wt 80.7 kg (178 lb)   SpO2 97%   BMI 24.83 kg/m   Physical Exam  Nursing note and vitals reviewed.  77 year old male, resting comfortably and in no acute distress. Vital signs are significant for fever, tachypnea, elevated blood pressure. Oxygen saturation is 97%, which is normal. Head is normocephalic and atraumatic. PERRLA, EOMI. Oropharynx is clear.  Right tympanic membrane is faintly erythematous with an altered light reflex.  There is no bulging of the tympanic membrane. Neck is nontender and supple without adenopathy or JVD. Back is nontender and there is no CVA tenderness. Lungs are clear without rales, wheezes, or rhonchi. Chest is nontender. Heart has regular rate and rhythm without murmur. Abdomen is soft, flat, nontender without masses or hepatosplenomegaly and peristalsis is normoactive. Extremities have no cyanosis or edema, full range of motion is present. Skin is warm and dry without rash. Neurologic: Mental status is normal, cranial nerves are intact, there are no motor or sensory deficits.  ED Treatments / Results  Labs (all labs ordered are listed, but only abnormal results are displayed) Labs Reviewed  BASIC METABOLIC PANEL - Abnormal; Notable for the following components:      Result  Value   Glucose, Bld 102 (*)    All other components within normal limits  CBC - Abnormal; Notable for the following components:   Platelets 149 (*)    All other components within normal limits    EKG  EKG Interpretation  Date/Time:  Monday July 06 2017 16:29:01 EST Ventricular Rate:  83 PR Interval:  278 QRS Duration: 106 QT Interval:  358 QTC Calculation: 420 R Axis:   -80 Text Interpretation:  Sinus rhythm with 1st degree A-V block Left axis deviation Incomplete right bundle branch block T wave abnormality Artifact Abnormal ekg When compared with ECG of 05/30/2017, No significant change was found Reconfirmed by Delora Fuel ([id number]) on 07/06/2017 11:32:18 PM      Procedures Procedures (including critical care time)  Medications Ordered in ED Medications  oxyCODONE-acetaminophen (PERCOCET/ROXICET) 5-325 MG per tablet 1 tablet (not administered)  cefUROXime (CEFTIN) tablet 500 mg (not administered)     I have reviewed the triage vital signs and the nursing notes.  Pertinent lab results that were available during my care of the patient were reviewed by me and considered in my medical decision making (see chart for details).  Right ear pain with faint erythema and fever.  Consultation is consistent with otitis media, although ear findings are definitely less significant than I would have expected.  He is allergic to penicillin, so is given a dose of cefuroxime also given oxycodone-acetaminophen for pain and is discharged with prescriptions for same.  Follow-up with PCP as needed.Old records are reviewed, and he does have prior ED visit for dizziness.  Final Clinical Impressions(s) / ED Diagnoses   Final diagnoses:  Acute suppurative otitis media of right ear without spontaneous rupture of tympanic membrane, recurrence not specified    ED Discharge Orders        Ordered    oxyCODONE-acetaminophen (PERCOCET) 5-325 MG tablet  Every 4 hours PRN     07/06/17 2343  cefUROXime (CEFTIN) 500 MG tablet  2 times daily with meals     58/34/62 1947       Delora Fuel, MD 12/52/71 2348

## 2017-07-06 NOTE — Discharge Instructions (Signed)
Return if symptoms are getting worse. °

## 2017-07-06 NOTE — ED Triage Notes (Signed)
Per Pt, Pt is coming from home with complaints of dizziness that started about 1.5 hours ago. Pt reports that he has right ear pain along with cough. Denies any congestion. Reports some balance changes intermittently "for quite some time" along with blurred vision. Denies any unilateral weakness.

## 2017-07-06 NOTE — ED Provider Notes (Signed)
MSE was initiated and I personally evaluated the patient and placed orders (if any) at  1630 on July 06, 2017.  The patient appears stable so that the remainder of the MSE may be completed by another provider.  Patient awake alert, presenting with concern of an episode of dizziness, resolved, with only current complaint, left ear pain, no dizziness currently, no lightheadedness currently, no weakness currently.    EKG Interpretation  Date/Time:  Monday July 06 2017 16:29:01 EST Ventricular Rate:  83 PR Interval:  278 QRS Duration: 106 QT Interval:  358 QTC Calculation: 420 R Axis:   -80 Text Interpretation:  Sinus rhythm with 1st degree A-V block Left axis deviation Incomplete right bundle branch block T wave abnormality Artifact Abnormal ekg Confirmed by Carmin Muskrat [id number]) on 07/06/2017 4:48:24 PM         Carmin Muskrat, MD 07/06/17 [phone]

## 2017-07-08 ENCOUNTER — Emergency Department (HOSPITAL_COMMUNITY)
Admission: EM | Admit: 2017-07-08 | Discharge: 2017-07-08 | Disposition: A | Discharge status: Home / Self Care | Payer: Medicare PPO | Attending: Emergency Medicine | Admitting: Emergency Medicine

## 2017-07-08 ENCOUNTER — Encounter (HOSPITAL_COMMUNITY): Payer: Self-pay | Admitting: Emergency Medicine

## 2017-07-08 ENCOUNTER — Emergency Department (HOSPITAL_COMMUNITY): Payer: Medicare PPO

## 2017-07-08 ENCOUNTER — Other Ambulatory Visit: Payer: Self-pay

## 2017-07-08 DIAGNOSIS — I10 Essential (primary) hypertension: Secondary | ICD-10-CM | POA: Diagnosis not present

## 2017-07-08 DIAGNOSIS — Z87891 Personal history of nicotine dependence: Secondary | ICD-10-CM | POA: Insufficient documentation

## 2017-07-08 DIAGNOSIS — R42 Dizziness and giddiness: Secondary | ICD-10-CM | POA: Insufficient documentation

## 2017-07-08 DIAGNOSIS — Z79899 Other long term (current) drug therapy: Secondary | ICD-10-CM | POA: Insufficient documentation

## 2017-07-08 DIAGNOSIS — T50905A Adverse effect of unspecified drugs, medicaments and biological substances, initial encounter: Secondary | ICD-10-CM | POA: Diagnosis not present

## 2017-07-08 LAB — CBC WITH DIFFERENTIAL/PLATELET
Basophils Absolute: 0 10*3/uL (ref 0.0–0.1)
Basophils Relative: 0 %
Eosinophils Absolute: 0.1 10*3/uL (ref 0.0–0.7)
Eosinophils Relative: 2 %
HCT: 44.6 % (ref 39.0–52.0)
Hemoglobin: 15.4 g/dL (ref 13.0–17.0)
Lymphocytes Relative: 18 %
Lymphs Abs: 0.9 10*3/uL (ref 0.7–4.0)
MCH: 31.8 pg (ref 26.0–34.0)
MCHC: 34.5 g/dL (ref 30.0–36.0)
MCV: 92.1 fL (ref 78.0–100.0)
Monocytes Absolute: 0.4 10*3/uL (ref 0.1–1.0)
Monocytes Relative: 7 %
Neutro Abs: 3.5 10*3/uL (ref 1.7–7.7)
Neutrophils Relative %: 73 %
Platelets: 118 10*3/uL — ABNORMAL LOW (ref 150–400)
RBC: 4.84 MIL/uL (ref 4.22–5.81)
RDW: 14.4 % (ref 11.5–15.5)
WBC: 4.8 10*3/uL (ref 4.0–10.5)

## 2017-07-08 LAB — BASIC METABOLIC PANEL
Anion gap: 10 (ref 5–15)
BUN: 15 mg/dL (ref 6–20)
CO2: 22 mmol/L (ref 22–32)
Calcium: 9 mg/dL (ref 8.9–10.3)
Chloride: 106 mmol/L (ref 101–111)
Creatinine, Ser: 1.18 mg/dL (ref 0.61–1.24)
GFR calc Af Amer: >60 mL/min (ref >60)
GFR calc non Af Amer: 58 mL/min — ABNORMAL LOW (ref >60)
Glucose, Bld: 102 mg/dL — ABNORMAL HIGH (ref 65–99)
Potassium: 3.4 mmol/L — ABNORMAL LOW (ref 3.5–5.1)
Sodium: 138 mmol/L (ref 135–145)

## 2017-07-08 LAB — I-STAT CHEM 8, ED
BUN: 16 mg/dL (ref 6–20)
CHLORIDE: 104 mmol/L (ref 101–111)
CREATININE: 1.2 mg/dL (ref 0.61–1.24)
Calcium, Ion: 1.12 mmol/L — ABNORMAL LOW (ref 1.15–1.40)
Glucose, Bld: 105 mg/dL — ABNORMAL HIGH (ref 65–99)
HEMATOCRIT: 45 % (ref 39.0–52.0)
Hemoglobin: 15.3 g/dL (ref 13.0–17.0)
Potassium: 3.4 mmol/L — ABNORMAL LOW (ref 3.5–5.1)
Sodium: 141 mmol/L (ref 135–145)
TCO2: 24 mmol/L (ref 22–32)

## 2017-07-08 LAB — CBG MONITORING, ED: Glucose-Capillary: 93 mg/dL (ref 65–99)

## 2017-07-08 LAB — I-STAT TROPONIN, ED: Troponin i, poc: 0.03 ng/mL (ref 0.00–0.08)

## 2017-07-08 LAB — ACETAMINOPHEN LEVEL: Acetaminophen (Tylenol), Serum: <10 ug/mL — ABNORMAL LOW (ref 10–30)

## 2017-07-08 LAB — SALICYLATE LEVEL: Salicylate Lvl: <7 mg/dL (ref 2.8–30.0)

## 2017-07-08 MED ORDER — SODIUM CHLORIDE 0.9 % IV SOLN
Freq: Once | INTRAVENOUS | Status: AC
  Administered 2017-07-08: 125 mL/h via INTRAVENOUS

## 2017-07-08 NOTE — ED Notes (Signed)
Poison control contacted for medication OD with DM 10/Guaifenesn 100 mg that he got prescribed today, possible reaction hypotension, dizziness, Tachycardia, recommendation IV fluids, close observation 4-6 hours.

## 2017-07-08 NOTE — ED Provider Notes (Signed)
[facility] Provider Note   CSN: [id number] Arrival date & time: 07/08/17  0011     History   Chief Complaint Chief Complaint  Patient presents with  . Dizziness    HPI [facility] is a 77 y.o. male.  The history is provided by the patient.  Dizziness  Quality:  Lightheadedness Severity:  Moderate Onset quality:  Sudden Timing:  Constant Progression:  Unchanged Chronicity:  New Context: ear pain and medication   Context: not when bending over, not with head movement and not with loss of consciousness   Context comment:  And took half a bottle of cough syrup that he was only supposed to take a teaspoon of Relieved by:  Nothing Worsened by:  Nothing Ineffective treatments:  None tried Associated symptoms: no blood in stool, no chest pain, no diarrhea, no headaches, no hearing loss, no nausea, no palpitations, no shortness of breath, no syncope, no tinnitus, no vision changes, no vomiting and no weakness   Risk factors: multiple medications and new medications   Started on PErcocet for ear pain and a new cough syrup by his PMD, instead of taking a teaspons he drank half the bottle at 10 pm.    Past Medical History:  Diagnosis Date  . BPH (benign prostatic hyperplasia)   . Cerebrovascular disease   . Congenital anomaly of diaphragm   . Elevated PSA   . Glaucoma, both eyes   . Hemorrhoid   . Hepatitis B surface antigen positive    02-20-2011  . History of adenomatous polyp of colon    2007, 2009 and 2013  tubular adenoma's  . History of alcohol abuse    quit 1963  . History of cerebral parenchymal hemorrhage    01/ 2006  left occiptial lobe related to hypertensive crisis  . History of CVA (cerebrovascular accident)    09-12-2012  left hippocampus/ amygdala junction and per MRI old white matter infarcts--  per pt residual short- term memory issues  . History of fatty infiltration of liver hx visit's at [facility] ,  last visit 05/ 2014   elvated LFT's ,  via liver bx 2004 related to hx alcohol and drug abuse (quit 1964)  . 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 HTN (hypertension)   . Renal cyst, left   . Stroke ([place])    hx of 3 strokes in past   . 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 Urethral lesion    urethral mass    Patient Active Problem List   Diagnosis Date Noted  . PSVT (paroxysmal supraventricular tachycardia) ([place]) 06/03/2017  . Dizziness 08/02/2014  . 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 CVA (cerebral infarction) 09/13/2012  . Vertigo 09/12/2012  . EAR PAIN 09/05/2009  . BACK PAIN 06/20/2009  . COLONIC POLYPS 05/06/2007  . 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 NONDEPENDENT ALCOHOL ABUSE IN REMISSION 05/06/2007  . HYPERTENSION, SEVERE 05/06/2007  . Hypertensive heart disease without heart failure 05/06/2007  . INTRACRANIAL HEMORRHAGE 05/06/2007  . Cerebrovascular disease 05/06/2007  . HEMORRHOIDS 05/06/2007  . INGUINAL HERNIA, RIGHT 05/06/2007  . RENAL CYST 05/06/2007  . HEMATURIA UNSPECIFIED 05/06/2007  . 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 PSA, INCREASED 05/06/2007  . LIVER FUNCTION TESTS, ABNORMAL 05/06/2007  . BPH (benign prostatic hypertrophy) with urinary obstruction 05/06/2007  . PERS HX NONCOMPLIANCE W/MED TX PRS HAZARDS HLTH 05/06/2007  Past Surgical History:  Procedure Laterality Date  . CARDIOVASCULAR STRESS TEST  05/05/2007   normal nuclear study w/ no ischemia/  normal LV fucntion and wall motion , ef60%  . COLONOSCOPY  last one 04-06-2012  . CYSTO/  LEFT RETROGRADE PYELOGRAM/ CYTOLOGY WASHINGS/  URETEROSCOPY  03/05/2000  . INGUINAL HERNIA REPAIR Bilateral 1965 and 1980's  . LAPAROSCOPIC INGUINAL HERNIA WITH UMBILICAL HERNIA Right 35/36/1443  . LIVER BIOPSY  1980's and 2004  . TRANSTHORACIC ECHOCARDIOGRAM  09/13/2012   moderate LVH,  ef 60-65%/    . TRANSURETHRAL RESECTION OF  BLADDER TUMOR N/A 08/11/2016   Procedure: TRANSURETHRAL RESECTION OF BLADDER TUMOR (TURBT);  Surgeon: Cleon Gustin, MD;  Location: [facility];  Service: Urology;  Laterality: N/A;       Home Medications    Prior to Admission medications   Medication Sig Start Date End Date Taking? Authorizing Provider  amLODipine (NORVASC) 10 MG tablet TAKE ONE TABLET BY MOUTH EVERY DAY    Noralee Space, MD  carvedilol (COREG) 25 MG tablet Take 25 mg by mouth 2 (two) times daily with a meal.     [provider]  cefUROXime (CEFTIN) 500 MG tablet Take 1 tablet (500 mg total) by mouth 2 (two) times daily with a meal. 06/06/38   Delora Fuel, MD  clopidogrel (PLAVIX) 75 MG tablet Take 1 tablet (75 mg total) by mouth daily with breakfast. 05/06/13   Noralee Space, MD  finasteride (PROSCAR) 5 MG tablet Take 5 mg by mouth daily.     [provider]  furosemide (LASIX) 40 MG tablet Take 1 tablet (40 mg total) by mouth daily. 05/03/12   Noralee Space, MD  hydrALAZINE (APRESOLINE) 50 MG tablet Take 50 mg by mouth 2 (two) times daily.     [provider]  latanoprost (XALATAN) 0.005 % ophthalmic solution Place 1 drop into both eyes at bedtime.     [provider]  oxyCODONE-acetaminophen (PERCOCET) 5-325 MG tablet Take 1 tablet by mouth every 4 (four) hours as needed for moderate pain. 0/8/67   Delora Fuel, MD  tadalafil (CIALIS) 20 MG tablet Take 1 tablet (20 mg total) by mouth daily as needed for erectile dysfunction. 05/06/13   Noralee Space, MD  tamsulosin (FLOMAX) 0.4 MG CAPS capsule Take 0.4 mg by mouth at bedtime.     [provider]  traMADol (ULTRAM) 50 MG tablet Take 1 tablet (50 mg total) by mouth every 6 (six) hours as needed. Patient taking differently: Take 50 mg by mouth every 6 (six) hours as needed.  08/11/16   McKenzie, Candee Furbish, MD    Family History Family History  Problem Relation Age of Onset  . Rheum arthritis Mother   .  Diabetes Mother   . Stroke Mother   . Heart attack Mother   . Heart attack Father   . Heart disease Maternal Grandmother   . Rheum arthritis Maternal Grandmother   . Diabetes Maternal Grandmother   . Stroke Maternal Grandmother   . Colon cancer Neg Hx     Social History Social History   Tobacco Use  . Smoking status: Former Smoker    Packs/day: 1.00    Years: 5.00    Pack years: 5.00    Types: Cigarettes    Last attempt to quit: 11/30/1981    Years since quitting: 35.6  . Smokeless tobacco: Never Used  Substance Use Topics  . Alcohol use: No    Alcohol/week:  0.0 oz    Comment: hx abuse -- quit:  1963  . Drug use: No    Comment: hx abuse -- quit 1964 (iv heroin and cocaine     Allergies   Penicillins   Review of Systems Review of Systems  Constitutional: Negative for diaphoresis and fever.  HENT: Negative for hearing loss and tinnitus.   Eyes: Negative for photophobia and visual disturbance.  Respiratory: Negative for shortness of breath.   Cardiovascular: Negative for chest pain, palpitations and syncope.  Gastrointestinal: Negative for blood in stool, diarrhea, nausea and vomiting.  Genitourinary: Negative for difficulty urinating and dysuria.  Neurological: Positive for dizziness and light-headedness. Negative for tremors, seizures, syncope, facial asymmetry, speech difficulty, weakness, numbness and headaches.  All other systems reviewed and are negative.    Physical Exam Updated Vital Signs BP 109/73 (BP Location: Right Arm)   Pulse 66   Temp 98.3 F (36.8 C) (Oral)   Resp 16   Ht 5\' 11"  (1.803 m)   Wt 80.7 kg (178 lb)   SpO2 96%   BMI 24.83 kg/m   Physical Exam  Constitutional: He is oriented to person, place, and time. He appears well-developed and well-nourished. No distress.  HENT:  Head: Normocephalic and atraumatic.  Left Ear: External ear normal.  Nose: Nose normal.  Mouth/Throat: Oropharynx is clear and moist. No oropharyngeal exudate.    Red canal and red drum on the right  Eyes: EOM are normal. Pupils are equal, round, and reactive to light.  Neck: Normal range of motion. Neck supple.  Cardiovascular: Normal rate, regular rhythm, normal heart sounds and intact distal pulses.  Pulmonary/Chest: Effort normal and breath sounds normal. No stridor. He has no wheezes. He has no rales.  Abdominal: Soft. Bowel sounds are normal. He exhibits no mass. There is no tenderness. There is no rebound and no guarding.  Musculoskeletal: Normal range of motion.  Neurological: He is alert and oriented to person, place, and time. He displays normal reflexes. No cranial nerve deficit. Coordination normal.  Skin: Skin is warm. Capillary refill takes less than 2 seconds.  Psychiatric: He has a normal mood and affect.  Nursing note and vitals reviewed.    ED Treatments / Results  Labs (all labs ordered are listed, but only abnormal results are displayed)  Results for orders placed or performed during the hospital encounter of 27/25/36  Basic metabolic panel  Result Value Ref Range   Sodium 138 135 - 145 mmol/L   Potassium 3.4 (L) 3.5 - 5.1 mmol/L   Chloride 106 101 - 111 mmol/L   CO2 22 22 - 32 mmol/L   Glucose, Bld 102 (H) 65 - 99 mg/dL   BUN 15 6 - 20 mg/dL   Creatinine, Ser 1.18 0.61 - 1.24 mg/dL   Calcium 9.0 8.9 - 10.3 mg/dL   GFR calc non Af Amer 58 (L) >60 mL/min   GFR calc Af Amer >60 >60 mL/min   Anion gap 10 5 - 15  CBC with Differential/Platelet  Result Value Ref Range   WBC 4.8 4.0 - 10.5 K/uL   RBC 4.84 4.22 - 5.81 MIL/uL   Hemoglobin 15.4 13.0 - 17.0 g/dL   HCT 44.6 39.0 - 52.0 %   MCV 92.1 78.0 - 100.0 fL   MCH 31.8 26.0 - 34.0 pg   MCHC 34.5 30.0 - 36.0 g/dL   RDW 14.4 11.5 - 15.5 %   Platelets 118 (L) 150 - 400 K/uL   Neutrophils Relative %  73 %   Neutro Abs 3.5 1.7 - 7.7 K/uL   Lymphocytes Relative 18 %   Lymphs Abs 0.9 0.7 - 4.0 K/uL   Monocytes Relative 7 %   Monocytes Absolute 0.4 0.1 - 1.0 K/uL    Eosinophils Relative 2 %   Eosinophils Absolute 0.1 0.0 - 0.7 K/uL   Basophils Relative 0 %   Basophils Absolute 0.0 0.0 - 0.1 K/uL  CBG monitoring, ED  Result Value Ref Range   Glucose-Capillary 93 65 - 99 mg/dL   Comment 1 Notify RN    Comment 2 Document in Chart   I-Stat Chem 8, ED  Result Value Ref Range   Sodium 141 135 - 145 mmol/L   Potassium 3.4 (L) 3.5 - 5.1 mmol/L   Chloride 104 101 - 111 mmol/L   BUN 16 6 - 20 mg/dL   Creatinine, Ser 1.20 0.61 - 1.24 mg/dL   Glucose, Bld 105 (H) 65 - 99 mg/dL   Calcium, Ion 1.12 (L) 1.15 - 1.40 mmol/L   TCO2 24 22 - 32 mmol/L   Hemoglobin 15.3 13.0 - 17.0 g/dL   HCT 45.0 39.0 - 52.0 %  I-stat troponin, ED  Result Value Ref Range   Troponin i, poc 0.03 0.00 - 0.08 ng/mL   Comment 3           Ct Head Wo Contrast  Result Date: 07/08/2017 CLINICAL DATA:  Dizziness EXAM: CT HEAD WITHOUT CONTRAST TECHNIQUE: Contiguous axial images were obtained from the base of the skull through the vertex without intravenous contrast. COMPARISON:  MRI brain 10/16/2016, CT brain 10/16/2016 FINDINGS: Brain: No acute territorial infarction, hemorrhage or intracranial mass is visualized. Mild atrophy. Moderate small vessel ischemic changes of the white matter. Stable ventricle size. Vascular: No hyperdense vessels. Scattered calcifications at the carotid siphons. Vertebral artery calcification. Skull: No fracture. Mastoid sclerosis and fluid in the mastoid air cells. Fluid or tissue in the right middle ear. Sinuses/Orbits: Mild mucosal thickening in the maxillary and ethmoid sinuses. No acute orbital abnormality. Other: None IMPRESSION: 1. No CT evidence for acute intracranial abnormality. 2. Atrophy and small vessel ischemic changes of the white matter. Electronically Signed   By: Donavan Foil M.D.   On: 07/08/2017 01:02    EKG  EKG Interpretation  Date/Time:  Wednesday July 08 2017 00:16:48 EST Ventricular Rate:  69 PR Interval:    QRS Duration: 116 QT  Interval:  383 QTC Calculation: 411 R Axis:   -81 Text Interpretation:  Sinus rhythm Prolonged PR interval Incomplete RBBB and LAFB Anterior infarct, old Confirmed by Randal Buba, Janthony Holleman ([id number]) on 07/08/2017 12:19:42 AM       Radiology Ct Head Wo Contrast  Result Date: 07/08/2017 CLINICAL DATA:  Dizziness EXAM: CT HEAD WITHOUT CONTRAST TECHNIQUE: Contiguous axial images were obtained from the base of the skull through the vertex without intravenous contrast. COMPARISON:  MRI brain 10/16/2016, CT brain 10/16/2016 FINDINGS: Brain: No acute territorial infarction, hemorrhage or intracranial mass is visualized. Mild atrophy. Moderate small vessel ischemic changes of the white matter. Stable ventricle size. Vascular: No hyperdense vessels. Scattered calcifications at the carotid siphons. Vertebral artery calcification. Skull: No fracture. Mastoid sclerosis and fluid in the mastoid air cells. Fluid or tissue in the right middle ear. Sinuses/Orbits: Mild mucosal thickening in the maxillary and ethmoid sinuses. No acute orbital abnormality. Other: None IMPRESSION: 1. No CT evidence for acute intracranial abnormality. 2. Atrophy and small vessel ischemic changes of the white matter. Electronically Signed   By:  Donavan Foil M.D.   On: 07/08/2017 01:02    Procedures Procedures (including critical care time)  Medications Ordered in ED Medications  0.9 %  sodium chloride infusion (125 mL/hr Intravenous New Bag/Given 07/08/17 0308)    Per poison control must be observed, will do so   Final Clinical Impressions(s) / ED Diagnoses   Return for weakness, numbness, changes in vision or speech,  fevers > 100.4 unrelieved by medication, shortness of breath, intractable vomiting, or diarrhea, abdominal pain, Inability to tolerate liquids or food, cough, altered mental status or any concerns. No signs of systemic illness or infection. The patient is nontoxic-appearing on exam and vital signs are within normal limits.      I have reviewed the triage vital signs and the nursing notes. Pertinent labs &imaging results that were available during my care of the patient were reviewed by me and considered in my medical decision making (see chart for details).  After history, exam, and medical workup I feel the patient has been appropriately medically screened and is safe for discharge home. Pertinent diagnoses were discussed with the patient. Patient was given return precautions.   Jariya Reichow, MD 07/08/17 [id number]

## 2017-07-08 NOTE — Discharge Instructions (Signed)
Do not take the cough syrup

## 2017-07-08 NOTE — ED Notes (Signed)
Pt able to ambulate to bathroom with steady gait, no dizziness, NAD noticed.

## 2017-07-08 NOTE — ED Notes (Signed)
CBG 93 

## 2017-07-08 NOTE — ED Triage Notes (Signed)
Pt brought to ED by [facility] from home for a c/o dizziness while on bed, pt was seen here yesterday and sent home with PO abx for ear infection. 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 Last Vitals for EMS BP 115/65, HR 70, R 19, SPO2 96% on 2L , CBG 108. PT AO x 4 no neuro deficit noticed on triage.

## 2017-10-03 ENCOUNTER — Emergency Department (HOSPITAL_COMMUNITY)
Admission: EM | Admit: 2017-10-03 | Discharge: 2017-10-03 | Disposition: A | Discharge status: Home / Self Care | Payer: Medicare PPO | Attending: Emergency Medicine | Admitting: Emergency Medicine

## 2017-10-03 ENCOUNTER — Other Ambulatory Visit: Payer: Self-pay

## 2017-10-03 DIAGNOSIS — R42 Dizziness and giddiness: Secondary | ICD-10-CM | POA: Diagnosis not present

## 2017-10-03 DIAGNOSIS — I10 Essential (primary) hypertension: Secondary | ICD-10-CM | POA: Insufficient documentation

## 2017-10-03 DIAGNOSIS — Z79899 Other long term (current) drug therapy: Secondary | ICD-10-CM | POA: Diagnosis not present

## 2017-10-03 DIAGNOSIS — Z87891 Personal history of nicotine dependence: Secondary | ICD-10-CM | POA: Insufficient documentation

## 2017-10-03 LAB — URINALYSIS, ROUTINE W REFLEX MICROSCOPIC
Bilirubin Urine: NEGATIVE
GLUCOSE, UA: NEGATIVE mg/dL
HGB URINE DIPSTICK: NEGATIVE
KETONES UR: NEGATIVE mg/dL
Leukocytes, UA: NEGATIVE
Nitrite: NEGATIVE
PROTEIN: NEGATIVE mg/dL
Specific Gravity, Urine: 1.008 (ref 1.005–1.030)
pH: 6 (ref 5.0–8.0)

## 2017-10-03 LAB — BASIC METABOLIC PANEL
Anion gap: 8 (ref 5–15)
BUN: 14 mg/dL (ref 6–20)
CALCIUM: 10.1 mg/dL (ref 8.9–10.3)
CO2: 25 mmol/L (ref 22–32)
CREATININE: 1.04 mg/dL (ref 0.61–1.24)
Chloride: 105 mmol/L (ref 101–111)
GFR calc non Af Amer: >60 mL/min (ref >60)
Glucose, Bld: 107 mg/dL — ABNORMAL HIGH (ref 65–99)
Potassium: 4.1 mmol/L (ref 3.5–5.1)
SODIUM: 138 mmol/L (ref 135–145)

## 2017-10-03 LAB — CBC
HCT: 51.2 % (ref 39.0–52.0)
Hemoglobin: 17.9 g/dL — ABNORMAL HIGH (ref 13.0–17.0)
MCH: 31.7 pg (ref 26.0–34.0)
MCHC: 35 g/dL (ref 30.0–36.0)
MCV: 90.8 fL (ref 78.0–100.0)
PLATELETS: 146 10*3/uL — AB (ref 150–400)
RBC: 5.64 MIL/uL (ref 4.22–5.81)
RDW: 14.5 % (ref 11.5–15.5)
WBC: 6.4 10*3/uL (ref 4.0–10.5)

## 2017-10-03 NOTE — Discharge Instructions (Addendum)
It was our pleasure to provide your ER care today - we hope that you feel better.  Rest. Drink adequate fluids.  Follow up with primary care doctor in the coming week.  Return to ER if worse, new symptoms, fevers, trouble breathing, fainting, other concern.

## 2017-10-03 NOTE — ED Notes (Signed)
Declined W/C at D/C and was escorted to lobby by RN. 

## 2017-10-03 NOTE — ED Triage Notes (Signed)
PT was working today and felt dizzy . Pr denies syncope. . On arrival Pt A/O  And speaking in full sentences. EMS vitals BP 122/78, HR 68 , R 20, O5 94

## 2017-10-03 NOTE — ED Provider Notes (Signed)
[facility] EMERGENCY DEPARTMENT Provider Note   CSN: [id number] Arrival date & time: 10/03/17  1000     History   Chief Complaint Chief Complaint  Patient presents with  . Dizziness    HPI Dustin Hamilton is a 77 y.o. male.  Patient c/o feeling lightheaded/faint when taking some pictures this AM. Symptoms were acute onset, moderate, lasted a few minutes, resolved currently. Denies associated palpitations. No chest pain or discomfort. No sob. Denies headache. No abd pain. No vomiting or diarrhea. Denies recent blood loss, melena or rectal bleeding. No fever or chills. Had not eaten anything yet today. Denies any recent change in meds or new meds. States did take his normal AM meds on empty stomach today.   The history is provided by the patient and the spouse.  Dizziness  Associated symptoms: no blood in stool, no chest pain, no headaches, no shortness of breath, no vomiting and no weakness     Past Medical History:  Diagnosis Date  . BPH (benign prostatic hyperplasia)   . Cerebrovascular disease   . Congenital anomaly of diaphragm   . Elevated PSA   . Glaucoma, both eyes   . Hemorrhoid   . Hepatitis B surface antigen positive    02-20-2011  . History of adenomatous polyp of colon    2007, 2009 and 2013  tubular adenoma's  . History of alcohol abuse    quit 1963  . History of cerebral parenchymal hemorrhage    01/ 2006  left occiptial lobe related to hypertensive crisis  . History of CVA (cerebrovascular accident)    09-12-2012  left hippocampus/ amygdala junction and per MRI old white matter infarcts--  per pt residual short- term memory issues  . History of fatty infiltration of liver hx visit's at [facility] , last visit 05/ 2014   elvated LFT's ,  via liver bx 2004 related to hx alcohol and drug abuse (quit 1964)  . 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 HTN (hypertension)   . Renal cyst, left   . Stroke  ([place])    hx of 3 strokes in past   . 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 Urethral lesion    urethral mass    Patient Active Problem List   Diagnosis Date Noted  . PSVT (paroxysmal supraventricular tachycardia) ([place]) 06/03/2017  . Dizziness 08/02/2014  . Stroke, thrombotic () 12/27/2012  . CVA (cerebral infarction) 09/13/2012  . Vertigo 09/12/2012  . EAR PAIN 09/05/2009  . BACK PAIN 06/20/2009  . COLONIC POLYPS 05/06/2007  . 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 NONDEPENDENT ALCOHOL ABUSE IN REMISSION 05/06/2007  . HYPERTENSION, SEVERE 05/06/2007  . Hypertensive heart disease without heart failure 05/06/2007  . INTRACRANIAL HEMORRHAGE 05/06/2007  . Cerebrovascular disease 05/06/2007  . HEMORRHOIDS 05/06/2007  . INGUINAL HERNIA, RIGHT 05/06/2007  . RENAL CYST 05/06/2007  . HEMATURIA UNSPECIFIED 05/06/2007  . 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 PSA, INCREASED 05/06/2007  . LIVER FUNCTION TESTS, ABNORMAL 05/06/2007  . BPH (benign prostatic hypertrophy) with urinary obstruction 05/06/2007  . PERS HX NONCOMPLIANCE W/MED TX PRS HAZARDS HLTH 05/06/2007    Past Surgical History:  Procedure Laterality Date  . CARDIOVASCULAR STRESS TEST  05/05/2007   normal nuclear study w/ no ischemia/  normal LV fucntion and wall motion , ef60%  . COLONOSCOPY  last one 04-06-2012  . CYSTO/  LEFT RETROGRADE PYELOGRAM/  CYTOLOGY WASHINGS/  URETEROSCOPY  03/05/2000  . INGUINAL HERNIA REPAIR Bilateral 1965 and 1980's  . LAPAROSCOPIC INGUINAL HERNIA WITH UMBILICAL HERNIA Right 80/99/8338  . LIVER BIOPSY  1980's and 2004  . TRANSTHORACIC ECHOCARDIOGRAM  09/13/2012   moderate LVH,  ef 60-65%/    . TRANSURETHRAL RESECTION OF BLADDER TUMOR N/A 08/11/2016   Procedure: TRANSURETHRAL RESECTION OF BLADDER TUMOR (TURBT);  Surgeon: Cleon Gustin, MD;  [facility]  Service: Urology;  Laterality: N/A;        Home Medications     Prior to Admission medications   Medication Sig Start Date End Date Taking? Authorizing Provider  amLODipine (NORVASC) 10 MG tablet TAKE ONE TABLET BY MOUTH EVERY DAY    Noralee Space, MD  carvedilol (COREG) 25 MG tablet Take 25 mg by mouth 2 (two) times daily with a meal.     [provider]  cefUROXime (CEFTIN) 500 MG tablet Take 1 tablet (500 mg total) by mouth 2 (two) times daily with a meal. Patient not taking: Reported on 07/07/537 12/06/71   Delora Fuel, MD  clopidogrel (PLAVIX) 75 MG tablet Take 1 tablet (75 mg total) by mouth daily with breakfast. 05/06/13   Noralee Space, MD  dextromethorphan-guaiFENesin (TUSSIN DM) 10-100 MG/5ML liquid Take 30 mLs by mouth every 4 (four) hours as needed for cough.    [provider]  finasteride (PROSCAR) 5 MG tablet Take 5 mg by mouth daily.     [provider]  furosemide (LASIX) 40 MG tablet Take 1 tablet (40 mg total) by mouth daily. 05/03/12   Noralee Space, MD  hydrALAZINE (APRESOLINE) 50 MG tablet Take 50 mg by mouth 2 (two) times daily.     [provider]  latanoprost (XALATAN) 0.005 % ophthalmic solution Place 1 drop into both eyes at bedtime.     [provider]  oxyCODONE-acetaminophen (PERCOCET) 5-325 MG tablet Take 1 tablet by mouth every 4 (four) hours as needed for moderate pain. 09/01/91   Delora Fuel, MD  tadalafil (CIALIS) 20 MG tablet Take 1 tablet (20 mg total) by mouth daily as needed for erectile dysfunction. 05/06/13   Noralee Space, MD  tamsulosin (FLOMAX) 0.4 MG CAPS capsule Take 0.4 mg by mouth at bedtime.     [provider]  traMADol (ULTRAM) 50 MG tablet Take 1 tablet (50 mg total) by mouth every 6 (six) hours as needed. Patient taking differently: Take 50 mg by mouth every 6 (six) hours as needed.  08/11/16   McKenzie, Candee Furbish, MD    Family History Family History  Problem Relation Age of Onset  . Rheum arthritis Mother   . Diabetes Mother   . Stroke Mother   .  Heart attack Mother   . Heart attack Father   . Heart disease Maternal Grandmother   . Rheum arthritis Maternal Grandmother   . Diabetes Maternal Grandmother   . Stroke Maternal Grandmother   . Colon cancer Neg Hx     Social History Social History   Tobacco Use  . Smoking status: Former Smoker    Packs/day: 1.00    Years: 5.00    Pack years: 5.00    Types: Cigarettes    Last attempt to quit: 11/30/1981    Years since quitting: 35.8  . Smokeless tobacco: Never Used  Substance Use Topics  . Alcohol use: No    Alcohol/week: 0.0 oz    Comment: hx abuse -- quit:  1963  .  Drug use: No    Comment: hx abuse -- quit 1964 (iv heroin and cocaine     Allergies   Penicillins   Review of Systems Review of Systems  Constitutional: Negative for fever.  HENT: Negative for sore throat.   Eyes: Negative for redness.  Respiratory: Negative for cough and shortness of breath.   Cardiovascular: Negative for chest pain.  Gastrointestinal: Negative for abdominal pain, blood in stool and vomiting.  Genitourinary: Negative for dysuria and flank pain.  Musculoskeletal: Negative for back pain and neck pain.  Skin: Negative for rash.  Neurological: Positive for dizziness and light-headedness. Negative for weakness, numbness and headaches.  Hematological: Does not bruise/bleed easily.  Psychiatric/Behavioral: Negative for confusion.     Physical Exam Updated Vital Signs There were no vitals taken for this visit.  Physical Exam  Constitutional: He appears well-developed and well-nourished. No distress.  HENT:  Head: Atraumatic.  Right Ear: External ear normal.  Left Ear: External ear normal.  Mouth/Throat: Oropharynx is clear and moist.  tms normal.   Eyes: Conjunctivae are normal.  Neck: Neck supple. No tracheal deviation present.  Cardiovascular: Normal rate, regular rhythm, normal heart sounds and intact distal pulses. Exam reveals no gallop and no friction rub.  No murmur  heard. Pulmonary/Chest: Effort normal and breath sounds normal. No accessory muscle usage. No respiratory distress.  Abdominal: Soft. Bowel sounds are normal. He exhibits no distension. There is no tenderness.  No pulsatile mass.   Genitourinary:  Genitourinary Comments: No cva tenderness  Musculoskeletal: He exhibits no edema.  Neurological: He is alert.  Alert, speech normal/fluent. No facial asymmetry or weakness. Steady gait.   Skin: Skin is warm and dry. No rash noted.  Psychiatric: He has a normal mood and affect.  Nursing note and vitals reviewed.    ED Treatments / Results  Labs (all labs ordered are listed, but only abnormal results are displayed) Results for orders placed or performed during the hospital encounter of 40/98/11  Basic metabolic panel  Result Value Ref Range   Sodium 138 135 - 145 mmol/L   Potassium 4.1 3.5 - 5.1 mmol/L   Chloride 105 101 - 111 mmol/L   CO2 25 22 - 32 mmol/L   Glucose, Bld 107 (H) 65 - 99 mg/dL   BUN 14 6 - 20 mg/dL   Creatinine, Ser 1.04 0.61 - 1.24 mg/dL   Calcium 10.1 8.9 - 10.3 mg/dL   GFR calc non Af Amer >60 >60 mL/min   GFR calc Af Amer >60 >60 mL/min   Anion gap 8 5 - 15  CBC  Result Value Ref Range   WBC 6.4 4.0 - 10.5 K/uL   RBC 5.64 4.22 - 5.81 MIL/uL   Hemoglobin 17.9 (H) 13.0 - 17.0 g/dL   HCT 51.2 39.0 - 52.0 %   MCV 90.8 78.0 - 100.0 fL   MCH 31.7 26.0 - 34.0 pg   MCHC 35.0 30.0 - 36.0 g/dL   RDW 14.5 11.5 - 15.5 %   Platelets 146 (L) 150 - 400 K/uL  Urinalysis, Routine w reflex microscopic  Result Value Ref Range   Color, Urine YELLOW YELLOW   APPearance CLEAR CLEAR   Specific Gravity, Urine 1.008 1.005 - 1.030   pH 6.0 5.0 - 8.0   Glucose, UA NEGATIVE NEGATIVE mg/dL   Hgb urine dipstick NEGATIVE NEGATIVE   Bilirubin Urine NEGATIVE NEGATIVE   Ketones, ur NEGATIVE NEGATIVE mg/dL   Protein, ur NEGATIVE NEGATIVE mg/dL   Nitrite NEGATIVE  NEGATIVE   Leukocytes, UA NEGATIVE NEGATIVE   EKG EKG  Interpretation  Date/Time:  Saturday Oct 03 2017 10:51:04 EDT Ventricular Rate:  65 PR Interval:    QRS Duration: 123 QT Interval:  407 QTC Calculation: 424 R Axis:   -141 Text Interpretation:  Sinus rhythm Prolonged PR interval Nonspecific intraventricular conduction delay No significant change since last tracing Confirmed by Lajean Saver [phone]) on 10/03/2017 10:56:05 AM   Radiology No results found.  Procedures Procedures (including critical care time)  Medications Ordered in ED Medications - No data to display   Initial Impression / Assessment and Plan / ED Course  I have reviewed the triage vital signs and the nursing notes.  Pertinent labs & imaging results that were available during my care of the patient were reviewed by me and considered in my medical decision making (see chart for details).  Labs sent.  Monitor. Ecg.  Reviewed nursing notes and prior charts for additional history.   Po fuids, snack.  Pt eating/drinking. No distress. Pt requests d/c, states feels fine.  Patient currently appears stable for d/c.     Final Clinical Impressions(s) / ED Diagnoses   Final diagnoses:  None    ED Discharge Orders    None       Lajean Saver, MD 10/03/17 1204

## 2017-10-27 ENCOUNTER — Telehealth: Payer: Self-pay | Admitting: Pulmonary Disease

## 2017-10-27 NOTE — Telephone Encounter (Signed)
Spoke with pt, he had to call me back we he got in his car, there was a lot of background noise. Will await call back.

## 2017-10-28 NOTE — Telephone Encounter (Signed)
LMTCB

## 2017-10-28 NOTE — Telephone Encounter (Signed)
Attempted to call pt. I did not receive an answer. I have left a message for pt to return our call.  

## 2017-10-28 NOTE — Telephone Encounter (Signed)
Patient returned call, CB is [phone]

## 2017-10-30 NOTE — Telephone Encounter (Signed)
Attempted to call pt. I did not receive an answer. I have left a message for pt to return our call.  

## 2017-10-30 NOTE — Telephone Encounter (Signed)
Spoke with pt and his wife. They are aware that since SN has not seen him in over 3 years and that SN is no longer his PCP, we would not be able to do this referral. They will contact the pt's current PCP office for this. Nothing further was needed.

## 2018-03-15 ENCOUNTER — Ambulatory Visit: Payer: Medicare PPO | Admitting: Diagnostic Neuroimaging

## 2018-03-15 ENCOUNTER — Encounter: Payer: Self-pay | Admitting: Diagnostic Neuroimaging

## 2018-03-15 VITALS — BP 99/71 | HR 68 | Ht 71.0 in | Wt 171.4 lb

## 2018-03-15 DIAGNOSIS — R55 Syncope and collapse: Secondary | ICD-10-CM | POA: Diagnosis not present

## 2018-03-15 DIAGNOSIS — R42 Dizziness and giddiness: Secondary | ICD-10-CM

## 2018-03-15 DIAGNOSIS — F0390 Unspecified dementia without behavioral disturbance: Secondary | ICD-10-CM | POA: Diagnosis not present

## 2018-03-15 DIAGNOSIS — F03A0 Unspecified dementia, mild, without behavioral disturbance, psychotic disturbance, mood disturbance, and anxiety: Secondary | ICD-10-CM

## 2018-03-15 NOTE — Patient Instructions (Signed)
ABNORMAL SPELLS (likely low blood pressure attacks) - monitor BP at home and per PCP  DEMENTIA (VASCULAR + neurodegenerative / alzheimer's) - continue donepezil + memantine - safety / supervision issues reviewed - caregiver resources provided - NO driving; caution with finances and medication mgmt

## 2018-03-15 NOTE — Progress Notes (Signed)
[facility]  PATIENT: Dustin Hamilton DOB: 1940/06/03  REFERRING CLINICIAN: Kirke Corin, MD HISTORY FROM: patient and wife  REASON FOR VISIT: new consult    HISTORICAL  CHIEF COMPLAINT:  Chief Complaint  Patient presents with  . Seizures    rm 7 New Pt    HISTORY OF PRESENT ILLNESS:   77 year old male here for evaluation of memory loss.  Patient has had at least 3 to 4 years of gradual onset progressive short-term memory loss, confusion, cognitive decline.  He previously diagnosed with vascular dementia.  Past 2 years patient has had intermittent staring spells, zoning out spells, 3-4 times per month.  Sometimes associated with lightheadedness, dizziness and presyncope symptoms.  One time patient went to the hospital for evaluation and was found to have hypotension.  His blood pressure medicines have been adjusted and blood pressure has been improved since August 2019 and patient has not had any of these staring spells since that time.    REVIEW OF SYSTEMS: Full 14 system review of systems performed and negative with exception of: Dizziness memory loss snoring change in appetite rash itching weight loss.  ALLERGIES: Allergies  Allergen Reactions  . Penicillins Hives    Has patient had a PCN reaction causing immediate rash, facial/tongue/throat swelling, SOB or lightheadedness with hypotension: Yes Has patient had a PCN reaction causing severe rash involving mucus membranes or skin necrosis: Yes Has patient had a PCN reaction that required hospitalization No Has patient had a PCN reaction occurring within the last 10 years: No If all of the above answers are "NO", then may proceed with Cephalosporin use.     HOME MEDICATIONS: Outpatient Medications Prior to Visit  Medication Sig Dispense Refill  . amLODipine (NORVASC) 10 MG tablet TAKE ONE TABLET BY MOUTH EVERY DAY 90 tablet 0  . brimonidine (ALPHAGAN) 0.2 % ophthalmic solution Place 1 drop into  both eyes 2 (two) times daily.    . carvedilol (COREG) 25 MG tablet Take 12.5 mg by mouth 2 (two) times daily with a meal.     . Cholecalciferol (VITAMIN D3) 2000 units TABS Take 2,000 Units by mouth daily.    . clopidogrel (PLAVIX) 75 MG tablet Take 1 tablet (75 mg total) by mouth daily with breakfast. 30 tablet 11  . dextromethorphan-guaiFENesin (TUSSIN DM) 10-100 MG/5ML liquid Take 30 mLs by mouth every 4 (four) hours as needed for cough.    . finasteride (PROSCAR) 5 MG tablet Take 5 mg by mouth daily.     . furosemide (LASIX) 40 MG tablet Take 1 tablet (40 mg total) by mouth daily. 90 tablet 3  . hydrALAZINE (APRESOLINE) 50 MG tablet Take 50 mg by mouth 2 (two) times daily.     Marland Kitchen ibuprofen (ADVIL,MOTRIN) 800 MG tablet Take 800 mg by mouth 3 (three) times daily as needed for pain.  0  . latanoprost (XALATAN) 0.005 % ophthalmic solution Place 1 drop into both eyes at bedtime.     . meclizine (ANTIVERT) 25 MG tablet Take 25 mg by mouth 2 (two) times daily as needed for dizziness.    Marland Kitchen oxyCODONE-acetaminophen (PERCOCET) 5-325 MG tablet Take 1 tablet by mouth every 4 (four) hours as needed for moderate pain. 10 tablet 0  . tadalafil (CIALIS) 20 MG tablet Take 1 tablet (20 mg total) by mouth daily as needed for erectile dysfunction. 10 tablet 6  . tamsulosin (FLOMAX) 0.4 MG CAPS capsule Take 0.4 mg by mouth at bedtime.     Marland Kitchen  traMADol (ULTRAM) 50 MG tablet Take 1 tablet (50 mg total) by mouth every 6 (six) hours as needed. (Patient taking differently: Take 50 mg by mouth every 6 (six) hours as needed. ) 30 tablet 0   No facility-administered medications prior to visit.     PAST MEDICAL HISTORY: Past Medical History:  Diagnosis Date  . BPH (benign prostatic hyperplasia)   . Cerebrovascular disease   . Congenital anomaly of diaphragm   . Elevated PSA   . Glaucoma, both eyes   . Hemorrhoid   . Hepatitis B surface antigen positive    02-20-2011  . History of adenomatous polyp of colon     2007, 2009 and 2013  tubular adenoma's  . History of alcohol abuse    quit 1963  . History of cerebral parenchymal hemorrhage    01/ 2006  left occiptial lobe related to hypertensive crisis  . History of CVA (cerebrovascular accident)    09-12-2012  left hippocampus/ amygdala junction and per MRI old white matter infarcts--  per pt residual short- term memory issues  . History of fatty infiltration of liver hx visit's at [facility] , last visit 05/ 2014   elvated LFT's ,  via liver bx 2004 related to hx alcohol and drug abuse (quit 1964)  . 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 HTN (hypertension)   . Renal cyst, left   . Stroke ([place])    hx of 3 strokes in past   . 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 Urethral lesion    urethral mass    PAST SURGICAL HISTORY: Past Surgical History:  Procedure Laterality Date  . CARDIOVASCULAR STRESS TEST  05/05/2007   normal nuclear study w/ no ischemia/  normal LV fucntion and wall motion , ef60%  . COLONOSCOPY  last one 04-06-2012  . CYSTO/  LEFT RETROGRADE PYELOGRAM/ CYTOLOGY WASHINGS/  URETEROSCOPY  03/05/2000  . INGUINAL HERNIA REPAIR Bilateral 1965 and 1980's  . LAPAROSCOPIC INGUINAL HERNIA WITH UMBILICAL HERNIA Right 34/74/2595  . LIVER BIOPSY  1980's and 2004  . TRANSTHORACIC ECHOCARDIOGRAM  09/13/2012   moderate LVH,  ef 60-65%/    . TRANSURETHRAL RESECTION OF BLADDER TUMOR N/A 08/11/2016   Procedure: TRANSURETHRAL RESECTION OF BLADDER TUMOR (TURBT);  Surgeon: Cleon Gustin, MD;  Location: [facility];  Service: Urology;  Laterality: N/A;    FAMILY HISTORY: Family History  Problem Relation Age of Onset  . Rheum arthritis Mother   . Diabetes Mother   . Stroke Mother   . Heart attack Mother   . Heart attack Father   . Heart disease Maternal Grandmother   . Rheum arthritis Maternal Grandmother   . Diabetes Maternal Grandmother   . Stroke Maternal  Grandmother   . Colon cancer Neg Hx     SOCIAL HISTORY: Social History   Socioeconomic History  . Marital status: Married    Spouse name: [name]  . Number of children: 1  . Years of education: College  . Highest education level: Not on file  Occupational History  . Occupation: Geophysicist/field seismologist  Social Needs  . Financial resource strain: Not on file  . Food insecurity:    Worry: Not on file    Inability: Not on file  . Transportation needs:    Medical: Not on file    Non-medical: Not on file  Tobacco Use  . Smoking status: Former Smoker  Packs/day: 1.00    Years: 5.00    Pack years: 5.00    Types: Cigarettes    Last attempt to quit: 11/30/1981    Years since quitting: 36.3  . Smokeless tobacco: Never Used  Substance and Sexual Activity  . Alcohol use: No    Alcohol/week: 0.0 standard drinks    Comment: hx abuse -- quit:  1963  . 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 Sexual activity: Not on file  Lifestyle  . Physical activity:    Days per week: Not on file    Minutes per session: Not on file  . Stress: Not on file  Relationships  . Social connections:    Talks on phone: Not on file    Gets together: Not on file    Attends religious service: Not on file    Active member of club or organization: Not on file    Attends meetings of clubs or organizations: Not on file    Relationship status: Not on file  . Intimate partner violence:    Fear of current or ex partner: Not on file    Emotionally abused: Not on file    Physically abused: Not on file    Forced sexual activity: Not on file  Other Topics Concern  . Not on file  Social History Narrative   Patient lives at home with his spouse.   Caffeine Use: 6-7 sodas daily      PHYSICAL EXAM  GENERAL EXAM/CONSTITUTIONAL: Vitals:  Vitals:   03/15/18 0954  BP: 99/71  Pulse: 68  Weight: 171 lb 6.4 oz (77.7 kg)  Height: 5\' 11"  (1.803 m)   Body mass index is 23.91 kg/m. Wt  Readings from Last 10 Encounters:  03/15/18 171 lb 6.4 oz (77.7 kg)  10/03/17 178 lb (80.7 kg)  07/08/17 178 lb (80.7 kg)  07/06/17 178 lb (80.7 kg)  06/12/17 182 lb (82.6 kg)  06/12/17 184 lb 3.2 oz (83.6 kg)  06/03/17 182 lb (82.6 kg)  02/04/17 179 lb (81.2 kg)  10/24/16 174 lb (78.9 kg)  08/11/16 176 lb (79.8 kg)    Patient is in no distress; well developed, nourished and groomed; neck is supple  CARDIOVASCULAR:  Examination of carotid arteries is normal; no carotid bruits  Regular rate and rhythm, no murmurs  Examination of peripheral vascular system by observation and palpation is normal  EYES:  Ophthalmoscopic exam of optic discs and posterior segments is normal; no papilledema or hemorrhages  Visual Acuity Screening   Right eye Left eye Both eyes  Without correction: 20/40    With correction:     Comments: Unable to complete in left eye, glaucoma   MUSCULOSKELETAL:  Gait, strength, tone, movements noted in Neurologic exam below  NEUROLOGIC: MENTAL STATUS:  MMSE - Mini Mental State Exam 03/15/2018 12/26/2013  Orientation to time 4 5  Orientation to Place 4 5  Registration 3 3  Attention/ Calculation 2 3  Recall 3 3  Language- name 2 objects 2 2  Language- repeat 1 1  Language- follow 3 step command 3 3  Language- read & follow direction 1 1  Write a sentence 1 1  Copy design 0 1  Total score 24 28    awake, alert, oriented to person, place  DECR RECALL; memory intact  DECR attention and concentration  language fluent, comprehension intact, naming intact  fund of knowledge appropriate  CRANIAL NERVE:  2nd - no papilledema on fundoscopic exam  2nd, 3rd, 4th, 6th - pupils equal and reactive to light, visual fields full to confrontation, extraocular muscles intact, no nystagmus  5th - facial sensation symmetric  7th - facial strength symmetric  8th - hearing intact  9th - palate elevates symmetrically, uvula midline  11th - shoulder  shrug symmetric  12th - tongue protrusion midline  MOTOR:   normal bulk and tone, full strength in the BUE, BLE  SENSORY:   normal and symmetric to light touch, temperature, vibration  COORDINATION:   finger-nose-finger, fine finger movements normal  REFLEXES:   deep tendon reflexes TRACE and symmetric  GAIT/STATION:   narrow based gait; DECR ARM SWING     DIAGNOSTIC DATA (LABS, IMAGING, TESTING) - I reviewed patient records, labs, notes, testing and imaging myself where available.  Lab Results  Component Value Date   WBC 6.4 10/03/2017   HGB 17.9 (H) 10/03/2017   HCT 51.2 10/03/2017   MCV 90.8 10/03/2017   PLT 146 (L) 10/03/2017      Component Value Date/Time   NA 138 10/03/2017 1040   K 4.1 10/03/2017 1040   CL 105 10/03/2017 1040   CO2 25 10/03/2017 1040   GLUCOSE 107 (H) 10/03/2017 1040   BUN 14 10/03/2017 1040   CREATININE 1.04 10/03/2017 1040   CALCIUM 10.1 10/03/2017 1040   PROT 8.0 10/24/2016 0943   ALBUMIN 4.1 10/24/2016 0943   AST 33 10/24/2016 0943   ALT 34 10/24/2016 0943   ALKPHOS 58 10/24/2016 0943   BILITOT 0.9 10/24/2016 0943   GFRNONAA >60 10/03/2017 1040   GFRAA >60 10/03/2017 1040   Lab Results  Component Value Date   CHOL 123 09/13/2012   HDL 25 (L) 09/13/2012   LDLCALC 76 09/13/2012   TRIG 112 09/13/2012   CHOLHDL 4.9 09/13/2012   Lab Results  Component Value Date   HGBA1C 6.2 (H) 09/13/2012   Lab Results  Component Value Date   [id number] 856 06/28/2013   Lab Results  Component Value Date   TSH 1.230 06/28/2013    10/16/16 MRI HEAD [I reviewed images myself and agree with interpretation. Moderate ventriculomegaly and atrophy.  -VRP]  1. No acute intracranial infarct or other process identified. 2. Innumerable chronic micro hemorrhages scattered throughout the brain, most likely related to chronic underlying hypertension given patient history. 3. Stable atrophy with moderate chronic microvascular ischemic  disease.  10/16/16 MRA HEAD [I reviewed images myself and agree with interpretation. -VRP]  Negative intracranial MRA. No large or proximal arterial branch occlusion. No high-grade or correctable stenosis.  10/16/16 MRA NECK [I reviewed images myself and agree with interpretation. -VRP]  1. Negative MRA of the neck. No hemodynamically significant or critical stenosis identified within the neck.  2. 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  01/23/17 MRI brain - No acute treatment pathology identified, specifically no acute infarct. - Remote lacunar infarcts in the bilateral frontal lobe centrum semiovale. - Moderate chronic small vessel ischemic change. - Multiple scattered foci of susceptibility most abundant within the peripheral cerebrum and cerebellum which may reflect amyloid angiopathy pattern.   07/08/17 CT head [I reviewed images myself and agree with interpretation. -VRP]  1. No CT evidence for acute intracranial abnormality. 2. Atrophy and small vessel ischemic changes of the white matter.     ASSESSMENT AND PLAN  77 y.o. year old male here with gradual onset progressive short-term memory loss confusion and decline in ADLs, consistent with  mild dementia.  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  Abnormal spells of staring and zoning out are likely presyncope and hypotension related.  Complex partial seizures are possible but symptoms seem to have improved since blood pressure medicines have been adjusted.   MMSE 24/30 ADL 6/6 IADL 7/8   Dx:  1. Postural dizziness with presyncope   2. Mild dementia ([place])      PLAN:  ABNORMAL SPELLS (likely presyncope / hypotension; sxs resolved since BP meds have been adjusted) - monitor BP at home and per PCP  DEMENTIA (VASCULAR + neurodegenerative / alzheimer's) - continue donepezil + memantine - safety / supervision issues reviewed - caregiver resources provided - NO driving;  caution with finances and medication mgmt  Return if symptoms worsen or fail to improve, for return to PCP.    Penni Bombard, MD 46/96/2952, 8:41 AM Certified in Neurology, Neurophysiology and Neuroimaging  [facility] [address] [place], [place] [address] [phone]

## 2018-06-04 ENCOUNTER — Ambulatory Visit
Admission: EM | Admit: 2018-06-04 | Discharge: 2018-06-04 | Disposition: A | Discharge status: Home / Self Care | Payer: 59 | Attending: Emergency Medicine | Admitting: Emergency Medicine

## 2018-06-04 DIAGNOSIS — B9789 Other viral agents as the cause of diseases classified elsewhere: Secondary | ICD-10-CM | POA: Diagnosis not present

## 2018-06-04 DIAGNOSIS — H66002 Acute suppurative otitis media without spontaneous rupture of ear drum, left ear: Secondary | ICD-10-CM

## 2018-06-04 DIAGNOSIS — J069 Acute upper respiratory infection, unspecified: Secondary | ICD-10-CM | POA: Diagnosis not present

## 2018-06-04 MED ORDER — DOXYCYCLINE HYCLATE 100 MG PO CAPS: 100.0000 mg | ORAL_CAPSULE | Freq: Two times a day (BID) | ORAL | 0 refills | Status: DC

## 2018-06-04 MED ORDER — BENZONATATE 100 MG PO CAPS: 100.0000 mg | ORAL_CAPSULE | Freq: Three times a day (TID) | ORAL | 0 refills | Status: DC

## 2018-06-04 MED ORDER — CETIRIZINE HCL 5 MG PO CHEW: 5.0000 mg | CHEWABLE_TABLET | Freq: Every day | ORAL | 0 refills | Status: DC

## 2018-06-04 MED ORDER — FLUTICASONE PROPIONATE 50 MCG/ACT NA SUSP: 2.0000 | Freq: Every day | NASAL | 0 refills | Status: DC

## 2018-06-04 NOTE — Discharge Instructions (Signed)
Get plenty of rest and push fluids Tessalon Perles prescribed for cough Zyrtec prescribed for nasal congestion, runny nose, and/or sore throat Flonase prescribed for nasal congestion and runny nose Use medications daily for symptom relief Doxycycline prescribed for ear infection.  Take as directed and to completion Use OTC medications like ibuprofen or tylenol as needed fever or pain Follow up with PCP next week for recheck Return or go to ER if you have any new or worsening symptoms fever, chills, nausea, vomiting, chest pain, cough, shortness of breath, wheezing, abdominal pain, changes in bowel or bladder habits, if you do not have improvement in your symptoms with antibiotic, etc..Marland Kitchen

## 2018-06-04 NOTE — ED Provider Notes (Signed)
[facility]   [id number] 06/04/18 Arrival Time: 4098   CC: URI symptoms   SUBJECTIVE: History from: patient.  Dustin Hamilton is a 78 y.o. male who presents with abrupt onset of bilateral ear pain, nasal congestion, runny nose, and productive cough x 3 days.  Denies positive sick exposure or precipitating event.  Has NOT tried OTC medications.  Denies aggravating factors.  Denies previous symptoms in the past.   Denies fever, chills, fatigue, sinus pain, SOB, wheezing, chest pain, nausea, changes in bowel or bladder habits.    Received flu shot this year: no.  ROS: As per HPI.  Past Medical History:  Diagnosis Date  . BPH (benign prostatic hyperplasia)   . Cerebrovascular disease   . Congenital anomaly of diaphragm   . Elevated PSA   . Glaucoma, both eyes   . Hemorrhoid   . Hepatitis B surface antigen positive    02-20-2011  . History of adenomatous polyp of colon    2007, 2009 and 2013  tubular adenoma's  . History of alcohol abuse    quit 1963  . History of cerebral parenchymal hemorrhage    01/ 2006  left occiptial lobe related to hypertensive crisis  . History of CVA (cerebrovascular accident)    09-12-2012  left hippocampus/ amygdala junction and per MRI old white matter infarcts--  per pt residual short- term memory issues  . History of fatty infiltration of liver hx visit's at [facility] , last visit 05/ 2014   elvated LFT's ,  via liver bx 2004 related to hx alcohol and drug abuse (quit 1964)  . 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 HTN (hypertension)   . Renal cyst, left   . Stroke ([place])    hx of 3 strokes in past   . 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 Urethral lesion    urethral mass   Past Surgical History:  Procedure Laterality Date  . CARDIOVASCULAR STRESS TEST  05/05/2007   normal nuclear study w/ no ischemia/  normal LV fucntion and wall motion , ef60%  .  COLONOSCOPY  last one 04-06-2012  . CYSTO/  LEFT RETROGRADE PYELOGRAM/ CYTOLOGY WASHINGS/  URETEROSCOPY  03/05/2000  . INGUINAL HERNIA REPAIR Bilateral 1965 and 1980's  . LAPAROSCOPIC INGUINAL HERNIA WITH UMBILICAL HERNIA Right 11/91/4782  . LIVER BIOPSY  1980's and 2004  . TRANSTHORACIC ECHOCARDIOGRAM  09/13/2012   moderate LVH,  ef 60-65%/    . TRANSURETHRAL RESECTION OF BLADDER TUMOR N/A 08/11/2016   Procedure: TRANSURETHRAL RESECTION OF BLADDER TUMOR (TURBT);  Surgeon: Cleon Gustin, MD;  Location: [facility];  Service: Urology;  Laterality: N/A;   Allergies  Allergen Reactions  . Penicillins Hives    Has patient had a PCN reaction causing immediate rash, facial/tongue/throat swelling, SOB or lightheadedness with hypotension: Yes Has patient had a PCN reaction causing severe rash involving mucus membranes or skin necrosis: Yes Has patient had a PCN reaction that required hospitalization No Has patient had a PCN reaction occurring within the last 10 years: No If all of the above answers are "NO", then may proceed with Cephalosporin use.    No current facility-administered medications on file prior to encounter.    Current Outpatient Medications on File Prior to Encounter  Medication Sig Dispense Refill  . amLODipine (NORVASC) 10 MG tablet TAKE ONE TABLET BY MOUTH EVERY DAY 90 tablet 0  .  brimonidine (ALPHAGAN) 0.2 % ophthalmic solution Place 1 drop into both eyes 2 (two) times daily.    . carvedilol (COREG) 25 MG tablet Take 12.5 mg by mouth 2 (two) times daily with a meal.     . Cholecalciferol (VITAMIN D3) 2000 units TABS Take 2,000 Units by mouth daily.    . clopidogrel (PLAVIX) 75 MG tablet Take 1 tablet (75 mg total) by mouth daily with breakfast. 30 tablet 11  . dextromethorphan-guaiFENesin (TUSSIN DM) 10-100 MG/5ML liquid Take 30 mLs by mouth every 4 (four) hours as needed for cough.    . donepezil (ARICEPT) 5 MG tablet Take 5 mg by mouth at bedtime.    .  finasteride (PROSCAR) 5 MG tablet Take 5 mg by mouth daily.     . furosemide (LASIX) 40 MG tablet Take 1 tablet (40 mg total) by mouth daily. 90 tablet 3  . hydrALAZINE (APRESOLINE) 50 MG tablet Take 50 mg by mouth 2 (two) times daily.     Marland Kitchen ibuprofen (ADVIL,MOTRIN) 800 MG tablet Take 800 mg by mouth 3 (three) times daily as needed for pain.  0  . latanoprost (XALATAN) 0.005 % ophthalmic solution Place 1 drop into both eyes at bedtime.     . meclizine (ANTIVERT) 25 MG tablet Take 25 mg by mouth 2 (two) times daily as needed for dizziness.    Marland Kitchen MEMANTINE HCL PO Take 10 mg by mouth at bedtime.    Marland Kitchen oxyCODONE-acetaminophen (PERCOCET) 5-325 MG tablet Take 1 tablet by mouth every 4 (four) hours as needed for moderate pain. 10 tablet 0  . tadalafil (CIALIS) 20 MG tablet Take 1 tablet (20 mg total) by mouth daily as needed for erectile dysfunction. 10 tablet 6  . tamsulosin (FLOMAX) 0.4 MG CAPS capsule Take 0.4 mg by mouth at bedtime.     . traMADol (ULTRAM) 50 MG tablet Take 1 tablet (50 mg total) by mouth every 6 (six) hours as needed. (Patient taking differently: Take 50 mg by mouth every 6 (six) hours as needed. ) 30 tablet 0   Social History   Socioeconomic History  . Marital status: Married    Spouse name: [name]  . Number of children: 1  . Years of education: College  . Highest education level: Not on file  Occupational History  . Occupation: Geophysicist/field seismologist  Social Needs  . Financial resource strain: Not on file  . Food insecurity:    Worry: Not on file    Inability: Not on file  . Transportation needs:    Medical: Not on file    Non-medical: Not on file  Tobacco Use  . Smoking status: Former Smoker    Packs/day: 1.00    Years: 5.00    Pack years: 5.00    Types: Cigarettes    Last attempt to quit: 11/30/1981    Years since quitting: 36.5  . Smokeless tobacco: Never Used  Substance and Sexual Activity  . Alcohol use: No    Alcohol/week: 0.0 standard drinks    Comment: hx abuse --  quit:  1963  . 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 Sexual activity: Not on file  Lifestyle  . Physical activity:    Days per week: Not on file    Minutes per session: Not on file  . Stress: Not on file  Relationships  . Social connections:    Talks on phone: Not on file    Gets together:  Not on file    Attends religious service: Not on file    Active member of club or organization: Not on file    Attends meetings of clubs or organizations: Not on file    Relationship status: Not on file  . Intimate partner violence:    Fear of current or ex partner: Not on file    Emotionally abused: Not on file    Physically abused: Not on file    Forced sexual activity: Not on file  Other Topics Concern  . Not on file  Social History Narrative   Patient lives at home with his spouse.   Caffeine Use:  Tea, lots   Family History  Problem Relation Age of Onset  . Rheum arthritis Mother   . Diabetes Mother   . Stroke Mother   . Heart attack Mother   . Kidney failure Mother   . Heart attack Father   . Heart disease Maternal Grandmother   . Rheum arthritis Maternal Grandmother   . Diabetes Maternal Grandmother   . Stroke Maternal Grandmother   . Colon cancer Neg Hx     OBJECTIVE:  Vitals:   06/04/18 1202  BP: 133/89  Pulse: 94  Resp: 20  Temp: 98.8 F (37.1 C)  TempSrc: Oral  SpO2: 94%     General appearance: alert; appears mildly fatigued, but nontoxic; speaking in full sentences and tolerating own secretions HEENT: NCAT; Ears: EACs clear, RT TM with mild erythema, LT TM tense with dusky fluid and erythema; Eyes: PERRL.  EOM grossly intact. Nose: nares patent with clear rhinorrhea, Throat: oropharynx clear, tonsils non erythematous or enlarged, uvula midline  Neck: supple without LAD Lungs: unlabored respirations, symmetrical air entry; cough: mild; no respiratory distress; CTAB Heart: regular rate and rhythm.  Radial pulses 2+ symmetrical  bilaterally Skin: warm and dry Psychological: alert and cooperative; normal mood and affect  ASSESSMENT & PLAN:  1. Viral URI with cough   2. Non-recurrent acute suppurative otitis media of left ear without spontaneous rupture of tympanic membrane     Meds ordered this encounter  Medications  . cetirizine (ZYRTEC) 5 MG chewable tablet    Sig: Chew 1 tablet (5 mg total) by mouth daily.    Dispense:  20 tablet    Refill:  0    Order Specific Question:   Supervising Provider    Answer:   Raylene Everts [[id number]]  . fluticasone (FLONASE) 50 MCG/ACT nasal spray    Sig: Place 2 sprays into both nostrils daily.    Dispense:  16 g    Refill:  0    Order Specific Question:   Supervising Provider    Answer:   Raylene Everts [[id number]]  . benzonatate (TESSALON) 100 MG capsule    Sig: Take 1 capsule (100 mg total) by mouth every 8 (eight) hours.    Dispense:  21 capsule    Refill:  0    Order Specific Question:   Supervising Provider    Answer:   Raylene Everts [[id number]]  . doxycycline (VIBRAMYCIN) 100 MG capsule    Sig: Take 1 capsule (100 mg total) by mouth 2 (two) times daily.    Dispense:  20 capsule    Refill:  0    Order Specific Question:   Supervising Provider    Answer:   Raylene Everts [[id number]]    Get plenty of rest and push fluids Tessalon Perles prescribed for cough Zyrtec prescribed for nasal congestion, runny  nose, and/or sore throat Flonase prescribed for nasal congestion and runny nose Use medications daily for symptom relief Doxycycline prescribed for ear infection.  Take as directed and to completion Use OTC medications like ibuprofen or tylenol as needed fever or pain Follow up with PCP next week for recheck Return or go to ER if you have any new or worsening symptoms fever, chills, nausea, vomiting, chest pain, cough, shortness of breath, wheezing, abdominal pain, changes in bowel or bladder habits, if you do not have improvement in your symptoms  with antibiotic, etc...  Reviewed expectations re: course of current medical issues. Questions answered. Outlined signs and symptoms indicating need for more acute intervention. Patient verbalized understanding. After Visit Summary given.         Lestine Box, PA-C 06/04/18 1247

## 2018-06-04 NOTE — ED Triage Notes (Signed)
Pt c/o productive cough with while sputum x2 days

## 2018-06-16 ENCOUNTER — Other Ambulatory Visit: Payer: Self-pay | Admitting: Internal Medicine

## 2018-06-16 DIAGNOSIS — N179 Acute kidney failure, unspecified: Secondary | ICD-10-CM

## 2018-06-23 ENCOUNTER — Ambulatory Visit
Admission: RE | Admit: 2018-06-23 | Discharge: 2018-06-23 | Disposition: A | Discharge status: Home / Self Care | Payer: 59 | Source: Ambulatory Visit | Attending: Internal Medicine | Admitting: Internal Medicine

## 2018-06-23 DIAGNOSIS — N179 Acute kidney failure, unspecified: Secondary | ICD-10-CM

## 2018-07-26 ENCOUNTER — Other Ambulatory Visit: Payer: Self-pay | Admitting: Internal Medicine

## 2018-07-26 DIAGNOSIS — N179 Acute kidney failure, unspecified: Secondary | ICD-10-CM

## 2018-08-05 ENCOUNTER — Ambulatory Visit
Admission: RE | Admit: 2018-08-05 | Discharge: 2018-08-05 | Disposition: A | Discharge status: Home / Self Care | Payer: Medicare PPO | Source: Ambulatory Visit | Attending: Internal Medicine | Admitting: Internal Medicine

## 2018-08-05 DIAGNOSIS — N179 Acute kidney failure, unspecified: Secondary | ICD-10-CM

## 2018-08-05 MED ORDER — IOPAMIDOL (ISOVUE-300) INJECTION 61%
100.0000 mL | Freq: Once | INTRAVENOUS | Status: AC | PRN
  Administered 2018-08-05: 100 mL via INTRAVENOUS

## 2018-08-06 ENCOUNTER — Encounter (HOSPITAL_COMMUNITY): Payer: Self-pay | Admitting: Emergency Medicine

## 2018-08-06 ENCOUNTER — Other Ambulatory Visit: Payer: Self-pay

## 2018-08-06 ENCOUNTER — Emergency Department (HOSPITAL_COMMUNITY)
Admission: EM | Admit: 2018-08-06 | Discharge: 2018-08-06 | Disposition: A | Discharge status: Home / Self Care | Payer: Medicare PPO | Attending: Emergency Medicine | Admitting: Emergency Medicine

## 2018-08-06 ENCOUNTER — Emergency Department (HOSPITAL_COMMUNITY): Payer: Medicare PPO

## 2018-08-06 DIAGNOSIS — Z7902 Long term (current) use of antithrombotics/antiplatelets: Secondary | ICD-10-CM | POA: Insufficient documentation

## 2018-08-06 DIAGNOSIS — Z87891 Personal history of nicotine dependence: Secondary | ICD-10-CM | POA: Diagnosis not present

## 2018-08-06 DIAGNOSIS — I471 Supraventricular tachycardia: Secondary | ICD-10-CM | POA: Insufficient documentation

## 2018-08-06 DIAGNOSIS — Q791 Other congenital malformations of diaphragm: Secondary | ICD-10-CM | POA: Insufficient documentation

## 2018-08-06 DIAGNOSIS — I10 Essential (primary) hypertension: Secondary | ICD-10-CM | POA: Insufficient documentation

## 2018-08-06 DIAGNOSIS — Z79899 Other long term (current) drug therapy: Secondary | ICD-10-CM | POA: Diagnosis not present

## 2018-08-06 DIAGNOSIS — R002 Palpitations: Secondary | ICD-10-CM | POA: Diagnosis present

## 2018-08-06 LAB — BASIC METABOLIC PANEL
Anion gap: 7 (ref 5–15)
BUN: 15 mg/dL (ref 8–23)
CALCIUM: 9.7 mg/dL (ref 8.9–10.3)
CO2: 23 mmol/L (ref 22–32)
CREATININE: 1.32 mg/dL — AB (ref 0.61–1.24)
Chloride: 108 mmol/L (ref 98–111)
GFR calc Af Amer: 60 mL/min — ABNORMAL LOW (ref >60)
GFR, EST NON AFRICAN AMERICAN: 52 mL/min — AB (ref >60)
Glucose, Bld: 123 mg/dL — ABNORMAL HIGH (ref 70–99)
Potassium: 4.7 mmol/L (ref 3.5–5.1)
Sodium: 138 mmol/L (ref 135–145)

## 2018-08-06 LAB — CBC
HCT: 51.9 % (ref 39.0–52.0)
Hemoglobin: 17 g/dL (ref 13.0–17.0)
MCH: 30.2 pg (ref 26.0–34.0)
MCHC: 32.8 g/dL (ref 30.0–36.0)
MCV: 92.3 fL (ref 80.0–100.0)
PLATELETS: 167 10*3/uL (ref 150–400)
RBC: 5.62 MIL/uL (ref 4.22–5.81)
RDW: 14 % (ref 11.5–15.5)
WBC: 6.1 10*3/uL (ref 4.0–10.5)
nRBC: 0 % (ref 0.0–0.2)

## 2018-08-06 LAB — I-STAT TROPONIN, ED: Troponin i, poc: 0.01 ng/mL (ref 0.00–0.08)

## 2018-08-06 MED ORDER — SODIUM CHLORIDE 0.9% FLUSH: 3.0000 mL | Freq: Once | INTRAVENOUS | Status: DC

## 2018-08-06 MED ORDER — METOPROLOL TARTRATE 5 MG/5ML IV SOLN
2.5000 mg | Freq: Once | INTRAVENOUS | Status: AC
  Administered 2018-08-06: 2.5 mg via INTRAVENOUS
  Filled 2018-08-06: qty 5

## 2018-08-06 MED ORDER — SODIUM CHLORIDE 0.9 % IV BOLUS
1000.0000 mL | Freq: Once | INTRAVENOUS | Status: AC
  Administered 2018-08-06: 1000 mL via INTRAVENOUS

## 2018-08-06 MED ORDER — ADENOSINE 6 MG/2ML IV SOLN
6.0000 mg | Freq: Once | INTRAVENOUS | Status: AC
  Administered 2018-08-06: 6 mg via INTRAVENOUS
  Filled 2018-08-06: qty 2

## 2018-08-06 MED ORDER — CARVEDILOL 12.5 MG PO TABS
25.0000 mg | ORAL_TABLET | Freq: Once | ORAL | Status: AC
  Administered 2018-08-06: 25 mg via ORAL
  Filled 2018-08-06: qty 2

## 2018-08-06 NOTE — ED Notes (Signed)
Patient given discharge instructions and verbalized understanding.  Patient stable to discharge at this time.  Patient is alert and oriented to baseline.  No distressed noted at this time.  All belongings taken with the patient at discharge.   

## 2018-08-06 NOTE — ED Provider Notes (Signed)
[facility]ERGENC[facility] DEPARTMENT Provider Note   CSN: [id number] Arrival date & time: 08/06/18  1324    History   Chief Complaint Chief Complaint  Patient presents with  . Palpitations    HPI Dustin Hamilton is a 78 y.o. male.     HPI Patient presents feeling weak.  States has had a heart monitor.  Previous history of SVT but also history of palpitations.  Reportedly had been wearing a monitor but sounds like it was through the [place].  This morning felt lightheaded.  Feels dizzy.  Feels his heart racing.  No chest pain.  Had not taken his medicines this morning because he was feeling bad. Past Medical History:  Diagnosis Date  . BPH (benign prostatic hyperplasia)   . Cerebrovascular disease   . Congenital anomaly of diaphragm   . Elevated PSA   . Glaucoma, both eyes   . Hemorrhoid   . Hepatitis B surface antigen positive    02-20-2011  . History of adenomatous polyp of colon    2007, 2009 and 2013  tubular adenoma's  . History of alcohol abuse    quit 1963  . History of cerebral parenchymal hemorrhage    01/ 2006  left occiptial lobe related to hypertensive crisis  . History of CVA (cerebrovascular accident)    09-12-2012  left hippocampus/ amygdala junction and per MRI old white matter infarcts--  per pt residual short- term memory issues  . History of fatty infiltration of liver hx visit's at [facility] , last visit 05/ 2014   elvated LFT's ,  via liver bx 2004 related to hx alcohol and drug abuse (quit 1964)  . 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 HTN (hypertension)   . Renal cyst, left   . Stroke ([place])    hx of 3 strokes in past   . 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 Urethral lesion    urethral mass    Patient Active Problem List   Diagnosis Date Noted  . PSVT (paroxysmal supraventricular tachycardia) ([place]) 06/03/2017  . Dizziness 08/02/2014  . Stroke, thrombotic  ([place]) 12/27/2012  . CVA (cerebral infarction) 09/13/2012  . Vertigo 09/12/2012  . EAR PAIN 09/05/2009  . BACK PAIN 06/20/2009  . COLONIC POLYPS 05/06/2007  . 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 NONDEPENDENT ALCOHOL ABUSE IN REMISSION 05/06/2007  . HYPERTENSION, SEVERE 05/06/2007  . Hypertensive heart disease without heart failure 05/06/2007  . INTRACRANIAL HEMORRHAGE 05/06/2007  . Cerebrovascular disease 05/06/2007  . HEMORRHOIDS 05/06/2007  . INGUINAL HERNIA, RIGHT 05/06/2007  . RENAL CYST 05/06/2007  . HEMATURIA UNSPECIFIED 05/06/2007  . 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 PSA, INCREASED 05/06/2007  . LIVER FUNCTION TESTS, ABNORMAL 05/06/2007  . BPH (benign prostatic hypertrophy) with urinary obstruction 05/06/2007  . PERS HX NONCOMPLIANCE W/MED TX PRS HAZARDS HLTH 05/06/2007    Past Surgical History:  Procedure Laterality Date  . CARDIOVASCULAR STRESS TEST  05/05/2007   normal nuclear study w/ no ischemia/  normal LV fucntion and wall motion , ef60%  . COLONOSCOPY  last one 04-06-2012  . CYSTO/  LEFT RETROGRADE PYELOGRAM/ CYTOLOGY WASHINGS/  URETEROSCOPY  03/05/2000  . INGUINAL HERNIA REPAIR Bilateral 1965 and 1980's  . LAPAROSCOPIC INGUINAL HERNIA WITH UMBILICAL HERNIA Right 40/03/2724  . LIVER BIOPSY  1980's and 2004  . TRANSTHORACIC ECHOCARDIOGRAM  09/13/2012   moderate LVH,  ef 60-65%/    .  TRANSURETHRAL RESECTION OF BLADDER TUMOR N/A 08/11/2016   Procedure: TRANSURETHRAL RESECTION OF BLADDER TUMOR (TURBT);  Surgeon: Cleon Gustin, MD;  Location: [facility];  Service: Urology;  Laterality: N/A;        Home Medications    Prior to Admission medications   Medication Sig Start Date End Date Taking? Authorizing Provider  amLODipine (NORVASC) 10 MG tablet TAKE ONE TABLET BY MOUTH EVERY DAY Patient taking differently: Take 10 mg by mouth daily.    Yes Noralee Space, MD  brimonidine (ALPHAGAN) 0.2 % ophthalmic solution Place 1  drop into both eyes 2 (two) times daily.   Yes [provider]  carvedilol (COREG) 25 MG tablet Take 12.5 mg by mouth 2 (two) times daily with a meal.    Yes [provider]  clopidogrel (PLAVIX) 75 MG tablet Take 1 tablet (75 mg total) by mouth daily with breakfast. 05/06/13  Yes Noralee Space, MD  donepezil (ARICEPT) 5 MG tablet Take 5 mg by mouth at bedtime.   Yes [provider]  finasteride (PROSCAR) 5 MG tablet Take 5 mg by mouth daily.    Yes [provider]  furosemide (LASIX) 40 MG tablet Take 1 tablet (40 mg total) by mouth daily. 05/03/12  Yes Noralee Space, MD  hydrALAZINE (APRESOLINE) 50 MG tablet Take 50 mg by mouth 2 (two) times daily.    Yes [provider]  ibuprofen (ADVIL,MOTRIN) 800 MG tablet Take 800 mg by mouth 3 (three) times daily as needed for pain. 08/20/17  Yes [provider]  latanoprost (XALATAN) 0.005 % ophthalmic solution Place 1 drop into both eyes at bedtime.    Yes [provider]  meclizine (ANTIVERT) 25 MG tablet Take 25 mg by mouth 2 (two) times daily as needed for dizziness.   Yes [provider]  MEMANTINE HCL PO Take 20 mg by mouth at bedtime.    Yes [provider]  tamsulosin (FLOMAX) 0.4 MG CAPS capsule Take 0.4 mg by mouth at bedtime.    Yes [provider]  traMADol (ULTRAM) 50 MG tablet Take 1 tablet (50 mg total) by mouth every 6 (six) hours as needed. Patient taking differently: Take 50 mg by mouth every 6 (six) hours as needed.  08/11/16  Yes McKenzie, Candee Furbish, MD  benzonatate (TESSALON) 100 MG capsule Take 1 capsule (100 mg total) by mouth every 8 (eight) hours. Patient not taking: Reported on 08/06/2018 06/04/18   Wurst, [place], PA-C  cetirizine (ZYRTEC) 5 MG chewable tablet Chew 1 tablet (5 mg total) by mouth daily. Patient not taking: Reported on 08/06/2018 06/04/18   Wurst, [place], PA-C  fluticasone [facility]) 50 MCG/ACT nasal spray Place 2 sprays into both  nostrils daily. Patient not taking: Reported on 08/06/2018 06/04/18   Wurst, [place], PA-C  oxyCODONE-acetaminophen (PERCOCET) 5-325 MG tablet Take 1 tablet by mouth every 4 (four) hours as needed for moderate pain. Patient not taking: Reported on 08/02/2949 01/08/40   Delora Fuel, MD  tadalafil (CIALIS) 20 MG tablet Take 1 tablet (20 mg total) by mouth daily as needed for erectile dysfunction. Patient not taking: Reported on 08/06/2018 05/06/13   Noralee Space, MD    Family History Family History  Problem Relation Age of Onset  . Rheum arthritis Mother   . Diabetes Mother   . Stroke Mother   . Heart attack Mother   . Kidney failure Mother   . Heart attack Father   . Heart disease  Maternal Grandmother   . Rheum arthritis Maternal Grandmother   . Diabetes Maternal Grandmother   . Stroke Maternal Grandmother   . Colon cancer Neg Hx     Social History Social History   Tobacco Use  . Smoking status: Former Smoker    Packs/day: 1.00    Years: 5.00    Pack years: 5.00    Types: Cigarettes    Last attempt to quit: 11/30/1981    Years since quitting: 36.7  . Smokeless tobacco: Never Used  Substance Use Topics  . Alcohol use: No    Alcohol/week: 0.0 standard drinks    Comment: hx abuse -- quit:  1963  . Drug use: No    Comment: hx abuse -- quit 1964 (iv heroin and cocaine     Allergies   Penicillins   Review of Systems Review of Systems  Constitutional: Negative for appetite change.  HENT: Negative for congestion.   Respiratory: Negative for shortness of breath.   Cardiovascular: Negative for chest pain.  Gastrointestinal: Negative for abdominal pain.  Genitourinary: Negative for flank pain.  Musculoskeletal: Negative for back pain.  Skin: Negative for rash.  Neurological: Positive for dizziness and light-headedness.  Psychiatric/Behavioral: Negative for confusion.     Physical Exam Updated Vital Signs BP 139/90   Pulse 65   Temp 98 F (36.7 C) (Oral)   Resp 19    Ht 5\' 11"  (1.803 m)   Wt 77.6 kg   SpO2 95%   BMI 23.85 kg/m   Physical Exam HENT:     Head: Normocephalic.     Mouth/Throat:     Mouth: Mucous membranes are moist.  Eyes:     Extraocular Movements: Extraocular movements intact.  Neck:     Musculoskeletal: Neck supple.  Cardiovascular:     Rate and Rhythm: Tachycardia present.  Pulmonary:     Breath sounds: No wheezing, rhonchi or rales.  Abdominal:     Tenderness: There is no abdominal tenderness.  Musculoskeletal:     Right lower leg: No edema.     Left lower leg: No edema.  Skin:    General: Skin is warm.     Capillary Refill: Capillary refill takes less than 2 seconds.  Neurological:     General: No focal deficit present.     Mental Status: He is alert.      ED Treatments / Results  Labs (all labs ordered are listed, but only abnormal results are displayed) Labs Reviewed  BASIC METABOLIC PANEL - Abnormal; Notable for the following components:      Result Value   Glucose, Bld 123 (*)    Creatinine, Ser 1.32 (*)    GFR calc non Af Amer 52 (*)    GFR calc Af Amer 60 (*)    All other components within normal limits  CBC  I-STAT TROPONIN, ED    EKG EKG Interpretation  Date/Time:  Friday August 06 2018 10:41:06 EST Ventricular Rate:  76 PR Interval:    QRS Duration: 111 QT Interval:  379 QTC Calculation: 427 R Axis:   -88 Text Interpretation:  Sinus rhythm Atrial premature complexes Prolonged PR interval Incomplete RBBB and LAFB Anterior infarct, old SVT resolved Confirmed by Davonna Belling [phone]) on 08/06/2018 12:09:41 PM   Radiology Ct Abdomen Pelvis W Wo Contrast  Result Date: 08/05/2018 CLINICAL DATA:  Follow up of renal lesion seen at ultrasound. EXAM: CT ABDOMEN AND PELVIS WITHOUT AND WITH CONTRAST TECHNIQUE: Multidetector CT imaging of the abdomen and pelvis  was performed following the standard protocol before and following the bolus administration of intravenous contrast. CONTRAST:  128mL  ISOVUE-300 IOPAMIDOL (ISOVUE-300) INJECTION 61% COMPARISON:  06/23/2018 and CT scan from 05/05/2016 FINDINGS: Lower chest: Chronic volume loss and atelectasis anteriorly in the right lower lobe with associated airspace opacity roughly similar to the 05/05/2016 exam. There is also atelectasis in the right middle lobe along the right hemidiaphragm. The visualized bronchus intermedius appears unremarkable. Left anterior descending and circumflex coronary artery atherosclerotic calcification is present. Gynecomastia noted. Hepatobiliary: No significant findings. Pancreas: Unremarkable Spleen: Unremarkable Adrenals/Urinary Tract: Both adrenal glands appear normal. No urinary tract calculi or appreciable filling defect along the urothelium. A 1.1 by 1.0 cm hyperdense lesion of the left kidney upper pole posteriorly on image 67/2 does not appreciably enhance, most compatible with a Bosniak category 2 cyst, and may have measured as much as 0.6 cm on the 05/15/2016 exam. A 5.2 cm cystic lesion of the left kidney lower pole does not appreciably enhance, and is separated from a adjacent 1.5 by 1.2 cm cyst by a thin septation as shown on image 109/10. A do not see any mural nodularity. A precontrast hyperdense 1.3 by 1.1 cm lesion of the left kidney lower pole on image 119/10 anteriorly does not appreciably enhance and is most compatible with a Bosniak category 2 cyst. This previously measured about 0.8 cm. A 0.9 by 0.7 cm hyperdense lesion of the right kidney upper pole on image 79/2 is technically too small to characterize, and previously not readily seen on the prior exam. A precontrast hyperdense 1.0 by 0.9 cm lesion of the right mid to lower kidney laterally on image 97/2 does not enhance, compatible with a Bosniak category 2 cyst. A 0.8 cm hypodense lesion of the left kidney upper pole on image 80/11 is probably a benign cyst but technically too small to characterize. Several other tiny hypodense lesions are too small to  characterize in the kidneys. Stomach/Bowel: Unremarkable Vascular/Lymphatic: Aortoiliac atherosclerotic vascular disease. Reproductive: Once again we observe massive enlargement of the prostate gland, measuring 7.5 by 7.4 by 10.4 cm (volume = 300 cm^3), prominently indenting the bladder base. Other: No supplemental non-categorized findings. Musculoskeletal: Bridging spurring in the lower thoracic spine. IMPRESSION: 1. Scattered Bosniak category 1 and category 2 cysts in the kidneys. The lesions which could be characterized have benign imaging characteristics. There is a 0.8 cm right kidney upper pole lesion on image 79/2 which is hyperdense and probably a complex cyst, but technically too small to characterize based on morphology and location. This could be surveilled by CT or further characterized by renal protocol MRI with and without contrast, although from a statistical standpoint given the multiple other similar appearing cysts which are Bosniak category 2, this lesion is most likely benign. 2. Chronic volume loss and atelectasis in the right lower lobe and along the right diaphragm. 3.  Aortic Atherosclerosis (ICD10-I70.0).  Coronary atherosclerosis. 4. Massive enlargement of the prostate gland, 300 cubic cm in volume. Electronically Signed   By: Van Clines M.D.   On: 08/05/2018 14:51   Dg Chest 2 View  Result Date: 08/06/2018 CLINICAL DATA:  Heart palpitations today. EXAM: CHEST - 2 VIEW COMPARISON:  05/30/2017 FINDINGS: Cardiac silhouette is normal in size. No mediastinal or hilar masses. No evidence of adenopathy. Elevated right hemidiaphragm, stable. Mild linear atelectasis at the right lung base. Lungs otherwise clear. No pleural effusion or pneumothorax. Skeletal structures are intact. IMPRESSION: No active cardiopulmonary disease. Electronically Signed  By: Lajean Manes M.D.   On: 08/06/2018 12:26    Procedures Procedures (including critical care time)  Medications Ordered in  ED Medications  sodium chloride flush (NS) 0.9 % injection 3 mL (has no administration in time range)  sodium chloride 0.9 % bolus 1,000 mL (0 mLs Intravenous Stopped 08/06/18 1200)  adenosine (ADENOCARD) 6 MG/2ML injection 6 mg (6 mg Intravenous Given 08/06/18 1020)  metoprolol tartrate (LOPRESSOR) injection 2.5 mg (2.5 mg Intravenous Given 08/06/18 1235)  carvedilol (COREG) tablet 25 mg (25 mg Oral Given 08/06/18 1236)     Initial Impression / Assessment and Plan / ED Course  I have reviewed the triage vital signs and the nursing notes.  Pertinent labs & imaging results that were available during my care of the patient were reviewed by me and considered in my medical decision making (see chart for details).        Patient with recurrent SVT.  Has history of same.  Did not have his Coreg this morning.  However his rate usually only goes around 130 with SVT but was hypotensive with it.  Adenosine given and quickly went back to SVT off a sinus rhythm.  Given his oral Coreg did not take this morning and 2-1/2 of IV metoprolol.  Continued to remain in sinus rhythm.  Has cardiology follow-up with the [place].  Given a copy of his EKG so they can see the rhythm that he was in.  Will discharge home.  CRITICAL CARE Performed by: Davonna Belling Total critical care time: 30 minutes Critical care time was exclusive of separately billable procedures and treating other patients. Critical care was necessary to treat or prevent imminent or life-threatening deterioration. Critical care was time spent personally by me on the following activities: development of treatment plan with patient and/or surrogate as well as nursing, discussions with consultants, evaluation of patient's response to treatment, examination of patient, obtaining history from patient or surrogate, ordering and performing treatments and interventions, ordering and review of laboratory studies, ordering and review of radiographic studies, pulse  oximetry and re-evaluation of patient's condition.   Final Clinical Impressions(s) / ED Diagnoses   Final diagnoses:  SVT (supraventricular tachycardia) [facility])    ED Discharge Orders    None       Davonna Belling, MD 08/06/18 [phone]

## 2018-08-06 NOTE — ED Triage Notes (Signed)
Per EMS pt from home complaining of heart palpatations x1 month.  Dr Alvester Chou prescribe him a heart monitor and he has been wearing it for two weeks and has an appointment this coming Thursday.  Pt is AOx4 received 324 asprin by [facility] and 250 bolus by EMS NAD noted at this time denies pain.

## 2018-08-06 NOTE — Discharge Instructions (Addendum)
Follow-up with a cardiologist at the [facility].  Return if your heart rate continues to go quickly.  Show him the EKG of while you were going fast.  You converted back to a normal sinus rhythm.

## 2018-09-02 ENCOUNTER — Telehealth: Payer: Self-pay | Admitting: Diagnostic Neuroimaging

## 2018-09-02 NOTE — Telephone Encounter (Signed)
Pts wife on DPR called stating that she has not received the email for the Video Call appt on 09/06/18. Email in chart has been confirmed.

## 2018-09-02 NOTE — Telephone Encounter (Signed)
[name] wife [name] is has decreased strength in the left arm since Mondayand balance issues since Monday 08/30/18. A virutal appt has been scheduled for 4/6 at the request of his wife.   Due to current COVID 19 pandemic, our office is severely reducing in office visits for at least the next 2 weeks, in order to minimize the risk to our patients and healthcare providers.   Pt understands that although there may be some limitations with this type of visit, we will take all precautions to reduce any security or privacy concerns.  Pt understands that this will be treated like an in office visit and we will file with pt's insurance, and there may be a patient responsible charge related to this service.

## 2018-09-02 NOTE — Telephone Encounter (Signed)
Resent webex meeting e mail to [email or web address]. Called wife and she confirmed that she got e mail.

## 2018-09-02 NOTE — Telephone Encounter (Signed)
Called wife, [name] on [place], confirmed she consented to video visit for patient next Monday. Stated she understood due to current COVID 19 pandemic, our office is severely reducing in person visits in order to minimize the risk to our patients and healthcare providers. We recommend to convert your appointment to a video visit. We'll take all precautions to reduce any security or privacy concerns. This will be treated like an office visit, and we will file with your insurance. There may be a patient responsible charge related to this service. [name] email is [email or web address]. phone [phone] Wife understands that the [employer] webex software must be downloaded and operational on the device pt plans to use for the visit. Chart updated.  [name] verbalized understanding, appreciation. E mail sent.

## 2018-09-06 ENCOUNTER — Other Ambulatory Visit: Payer: Self-pay

## 2018-09-06 ENCOUNTER — Ambulatory Visit (INDEPENDENT_AMBULATORY_CARE_PROVIDER_SITE_OTHER): Payer: Medicare PPO | Admitting: Diagnostic Neuroimaging

## 2018-09-06 ENCOUNTER — Telehealth (HOSPITAL_COMMUNITY): Payer: Self-pay | Admitting: Rehabilitation

## 2018-09-06 DIAGNOSIS — G459 Transient cerebral ischemic attack, unspecified: Secondary | ICD-10-CM

## 2018-09-06 DIAGNOSIS — F0390 Unspecified dementia without behavioral disturbance: Secondary | ICD-10-CM | POA: Diagnosis not present

## 2018-09-06 DIAGNOSIS — R29898 Other symptoms and signs involving the musculoskeletal system: Secondary | ICD-10-CM

## 2018-09-06 DIAGNOSIS — F03A0 Unspecified dementia, mild, without behavioral disturbance, psychotic disturbance, mood disturbance, and anxiety: Secondary | ICD-10-CM

## 2018-09-06 NOTE — Progress Notes (Signed)
[facility]  PATIENT: Dustin Hamilton DOB: 11/28/1940  REFERRING CLINICIAN: Kirke Corin, MD HISTORY FROM: patient and wife  REASON FOR VISIT: FOLLOW UP (VIDEO VISIT)   HISTORICAL  CHIEF COMPLAINT:  Chief Complaint  Patient presents with  . Memory Loss    HISTORY OF PRESENT ILLNESS:   UPDATE (09/06/18, VRP): Since last visit, doing well until 1 weeks ago; patient woke up with new onset of slurred speech, left arm weakness, left arm numbness, trouble walking.  Wife called EMS for evaluation.  By the time they arrived symptoms were starting to improve.  He was able to stand and walk.  His blood pressure was 150s over 80s.  He did not have any fever.  Due to current virus pandemic, mild symptoms, rapidly improving symptoms, apparently EMS told patient that it would be safer if he stays at home, although I cannot verify this information.  Since that time symptoms have significantly improved.  His slurred speech has improved.  He still has weakness and numbness of his left arm.  His balance is unsteady but slightly improving.  He is tolerating his medications.  Memory stable.  PRIOR HPI 78 year old male here for evaluation of memory loss.  Patient has had at least 3 to 4 years of gradual onset progressive short-term memory loss, confusion, cognitive decline.  He previously diagnosed with vascular dementia.  Past 2 years patient has had intermittent staring spells, zoning out spells, 3-4 times per month.  Sometimes associated with lightheadedness, dizziness and presyncope symptoms.  One time patient went to the hospital for evaluation and was found to have hypotension.  His blood pressure medicines have been adjusted and blood pressure has been improved since August 2019 and patient has not had any of these staring spells since that time.    REVIEW OF SYSTEMS: Full 14 system review of systems performed and negative with exception of: as per HPI.   ALLERGIES: Allergies   Allergen Reactions  . Penicillins Hives    Has patient had a PCN reaction causing immediate rash, facial/tongue/throat swelling, SOB or lightheadedness with hypotension: Yes Has patient had a PCN reaction causing severe rash involving mucus membranes or skin necrosis: Yes Has patient had a PCN reaction that required hospitalization No Has patient had a PCN reaction occurring within the last 10 years: No If all of the above answers are "NO", then may proceed with Cephalosporin use.     HOME MEDICATIONS: Outpatient Medications Prior to Visit  Medication Sig Dispense Refill  . amLODipine (NORVASC) 10 MG tablet TAKE ONE TABLET BY MOUTH EVERY DAY (Patient taking differently: Take 10 mg by mouth daily. ) 90 tablet 0  . benzonatate (TESSALON) 100 MG capsule Take 1 capsule (100 mg total) by mouth every 8 (eight) hours. 21 capsule 0  . brimonidine (ALPHAGAN) 0.2 % ophthalmic solution Place 1 drop into both eyes 2 (two) times daily.    . carvedilol (COREG) 25 MG tablet Take 12.5 mg by mouth 2 (two) times daily with a meal.     . cetirizine (ZYRTEC) 5 MG chewable tablet Chew 1 tablet (5 mg total) by mouth daily. 20 tablet 0  . clopidogrel (PLAVIX) 75 MG tablet Take 1 tablet (75 mg total) by mouth daily with breakfast. 30 tablet 11  . donepezil (ARICEPT) 5 MG tablet Take 5 mg by mouth at bedtime.    . finasteride (PROSCAR) 5 MG tablet Take 5 mg by mouth daily.     . fluticasone (FLONASE) 50 MCG/ACT  nasal spray Place 2 sprays into both nostrils daily. 16 g 0  . hydrALAZINE (APRESOLINE) 50 MG tablet Take 50 mg by mouth 2 (two) times daily.     Marland Kitchen latanoprost (XALATAN) 0.005 % ophthalmic solution Place 1 drop into both eyes at bedtime.     . meclizine (ANTIVERT) 25 MG tablet Take 25 mg by mouth 2 (two) times daily as needed for dizziness.    Marland Kitchen MEMANTINE HCL PO Take 20 mg by mouth at bedtime.     Marland Kitchen oxyCODONE-acetaminophen (PERCOCET) 5-325 MG tablet Take 1 tablet by mouth every 4 (four) hours as needed for  moderate pain. (Patient not taking: Reported on 08/06/2018) 10 tablet 0  . tadalafil (CIALIS) 20 MG tablet Take 1 tablet (20 mg total) by mouth daily as needed for erectile dysfunction. (Patient not taking: Reported on 08/06/2018) 10 tablet 6  . tamsulosin (FLOMAX) 0.4 MG CAPS capsule Take 0.4 mg by mouth at bedtime.     . traMADol (ULTRAM) 50 MG tablet Take 1 tablet (50 mg total) by mouth every 6 (six) hours as needed. (Patient taking differently: Take 50 mg by mouth every 6 (six) hours as needed. ) 30 tablet 0   No facility-administered medications prior to visit.     PAST MEDICAL HISTORY: Past Medical History:  Diagnosis Date  . BPH (benign prostatic hyperplasia)   . Cerebrovascular disease   . Congenital anomaly of diaphragm   . Elevated PSA   . Glaucoma, both eyes   . Hemorrhoid   . Hepatitis B surface antigen positive    02-20-2011  . History of adenomatous polyp of colon    2007, 2009 and 2013  tubular adenoma's  . History of alcohol abuse    quit 1963  . History of cerebral parenchymal hemorrhage    01/ 2006  left occiptial lobe related to hypertensive crisis  . History of CVA (cerebrovascular accident)    09-12-2012  left hippocampus/ amygdala junction and per MRI old white matter infarcts--  per pt residual short- term memory issues  . History of fatty infiltration of liver hx visit's at [facility] , last visit 05/ 2014   elvated LFT's ,  via liver bx 2004 related to hx alcohol and drug abuse (quit 1964)  . 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 HTN (hypertension)   . Renal cyst, left   . Stroke ([place])    hx of 3 strokes in past   . 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 Urethral lesion    urethral mass    PAST SURGICAL HISTORY: Past Surgical History:  Procedure Laterality Date  . CARDIOVASCULAR STRESS TEST  05/05/2007   normal nuclear study w/ no ischemia/  normal LV fucntion and wall motion , ef60%   . COLONOSCOPY  last one 04-06-2012  . CYSTO/  LEFT RETROGRADE PYELOGRAM/ CYTOLOGY WASHINGS/  URETEROSCOPY  03/05/2000  . INGUINAL HERNIA REPAIR Bilateral 1965 and 1980's  . LAPAROSCOPIC INGUINAL HERNIA WITH UMBILICAL HERNIA Right 23/76/2831  . LIVER BIOPSY  1980's and 2004  . TRANSTHORACIC ECHOCARDIOGRAM  09/13/2012   moderate LVH,  ef 60-65%/    . TRANSURETHRAL RESECTION OF BLADDER TUMOR N/A 08/11/2016   Procedure: TRANSURETHRAL RESECTION OF BLADDER TUMOR (TURBT);  Surgeon: Cleon Gustin, MD;  Location: [facility];  Service: Urology;  Laterality: N/A;    FAMILY HISTORY: Family History  Problem Relation Age of  Onset  . Rheum arthritis Mother   . Diabetes Mother   . Stroke Mother   . Heart attack Mother   . Kidney failure Mother   . Heart attack Father   . Heart disease Maternal Grandmother   . Rheum arthritis Maternal Grandmother   . Diabetes Maternal Grandmother   . Stroke Maternal Grandmother   . Colon cancer Neg Hx     SOCIAL HISTORY: Social History   Socioeconomic History  . Marital status: Married    Spouse name: [name]  . Number of children: 1  . Years of education: College  . Highest education level: Not on file  Occupational History  . Occupation: Geophysicist/field seismologist  Social Needs  . Financial resource strain: Not on file  . Food insecurity:    Worry: Not on file    Inability: Not on file  . Transportation needs:    Medical: Not on file    Non-medical: Not on file  Tobacco Use  . Smoking status: Former Smoker    Packs/day: 1.00    Years: 5.00    Pack years: 5.00    Types: Cigarettes    Last attempt to quit: 11/30/1981    Years since quitting: 36.7  . Smokeless tobacco: Never Used  Substance and Sexual Activity  . Alcohol use: No    Alcohol/week: 0.0 standard drinks    Comment: hx abuse -- quit:  1963  . 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 Sexual activity: Not on file  Lifestyle  . Physical  activity:    Days per week: Not on file    Minutes per session: Not on file  . Stress: Not on file  Relationships  . Social connections:    Talks on phone: Not on file    Gets together: Not on file    Attends religious service: Not on file    Active member of club or organization: Not on file    Attends meetings of clubs or organizations: Not on file    Relationship status: Not on file  . Intimate partner violence:    Fear of current or ex partner: Not on file    Emotionally abused: Not on file    Physically abused: Not on file    Forced sexual activity: Not on file  Other Topics Concern  . Not on file  Social History Narrative   Patient lives at home with his spouse.   Caffeine Use:  Tea, lots     PHYSICAL EXAM  Video visit  GENERAL EXAM/CONSTITUTIONAL: Vitals:  There were no vitals filed for this visit. There is no height or weight on file to calculate BMI. Wt Readings from Last 10 Encounters:  08/06/18 171 lb (77.6 kg)  03/15/18 171 lb 6.4 oz (77.7 kg)  10/03/17 178 lb (80.7 kg)  07/08/17 178 lb (80.7 kg)  07/06/17 178 lb (80.7 kg)  06/12/17 182 lb (82.6 kg)  06/12/17 184 lb 3.2 oz (83.6 kg)  06/03/17 182 lb (82.6 kg)  02/04/17 179 lb (81.2 kg)  10/24/16 174 lb (78.9 kg)    Patient is in no distress; well developed, nourished and groomed; neck is supple  NEUROLOGIC: MENTAL STATUS:  MMSE - Mini Mental State Exam 03/15/2018 12/26/2013  Orientation to time 4 5  Orientation to Place 4 5  Registration 3 3  Attention/ Calculation 2 3  Recall 3 3  Language- name 2 objects 2 2  Language- repeat  1 1  Language- follow 3 step command 3 3  Language- read & follow direction 1 1  Write a sentence 1 1  Copy design 0 1  Total score 24 28    awake, alert, oriented to person  DECR RECALL; memory intact  DECR attention and concentration  language fluent, comprehension intact, naming intact  fund of knowledge appropriate  CRANIAL NERVE:   2nd, 3rd, 4th, 6th  - visual fields full to confrontation, extraocular muscles intact, no nystagmus  7th - facial strength symmetric  8th - hearing intact  11th - shoulder shrug symmetric  12th - tongue protrusion midline  MOTOR:   MILD LEFT PRONATOR DRIFT  VERY SLOW FINGER TAPPING IN LEFT HAND  SENSORY:   SUBJECTIVE NUMBNESS IN LEFT UPPER ARM  COORDINATION:   fine finger movements --> SLOW ON LEFT   GAIT/STATION:   OFF BALANCE; DECR ARM SWING     DIAGNOSTIC DATA (LABS, IMAGING, TESTING) - I reviewed patient records, labs, notes, testing and imaging myself where available.  Lab Results  Component Value Date   WBC 6.1 08/06/2018   HGB 17.0 08/06/2018   HCT 51.9 08/06/2018   MCV 92.3 08/06/2018   PLT 167 08/06/2018      Component Value Date/Time   NA 138 08/06/2018 0939   K 4.7 08/06/2018 0939   CL 108 08/06/2018 0939   CO2 23 08/06/2018 0939   GLUCOSE 123 (H) 08/06/2018 0939   BUN 15 08/06/2018 0939   CREATININE 1.32 (H) 08/06/2018 0939   CALCIUM 9.7 08/06/2018 0939   PROT 8.0 10/24/2016 0943   ALBUMIN 4.1 10/24/2016 0943   AST 33 10/24/2016 0943   ALT 34 10/24/2016 0943   ALKPHOS 58 10/24/2016 0943   BILITOT 0.9 10/24/2016 0943   GFRNONAA 52 (L) 08/06/2018 0939   GFRAA 60 (L) 08/06/2018 0939   Lab Results  Component Value Date   CHOL 123 09/13/2012   HDL 25 (L) 09/13/2012   LDLCALC 76 09/13/2012   TRIG 112 09/13/2012   CHOLHDL 4.9 09/13/2012   Lab Results  Component Value Date   HGBA1C 6.2 (H) 09/13/2012   Lab Results  Component Value Date   ERDEYCXK48 185 06/28/2013   Lab Results  Component Value Date   TSH 1.230 06/28/2013    10/16/16 MRI HEAD [I reviewed images myself and agree with interpretation. Moderate ventriculomegaly and atrophy.  -VRP]  1. No acute intracranial infarct or other process identified. 2. Innumerable chronic micro hemorrhages scattered throughout the brain, most likely related to chronic underlying hypertension given patient  history. 3. Stable atrophy with moderate chronic microvascular ischemic disease.  10/16/16 MRA HEAD [I reviewed images myself and agree with interpretation. -VRP]  Negative intracranial MRA. No large or proximal arterial branch occlusion. No high-grade or correctable stenosis.  10/16/16 MRA NECK [I reviewed images myself and agree with interpretation. -VRP]  1. Negative MRA of the neck. No hemodynamically significant or critical stenosis identified within the neck.  2. 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  01/23/17 MRI brain - No acute treatment pathology identified, specifically no acute infarct. - Remote lacunar infarcts in the bilateral frontal lobe centrum semiovale. - Moderate chronic small vessel ischemic change. - Multiple scattered foci of susceptibility most abundant within the peripheral cerebrum and cerebellum which may reflect amyloid angiopathy pattern.   07/08/17 CT head [I reviewed images myself and agree with interpretation. -VRP]  1. No CT evidence for acute intracranial abnormality. 2. Atrophy and small vessel  ischemic changes of the white matter.  06/09/17 TTE - Left ventricle: The cavity size was normal. Systolic function was   normal. The estimated ejection fraction was in the range of 60%   to 65%. Wall motion was normal; there were no regional wall   motion abnormalities. There was an increased relative   contribution of atrial contraction to ventricular filling.   Doppler parameters are consistent with abnormal left ventricular   relaxation (grade 1 diastolic dysfunction). Doppler parameters   are consistent with high ventricular filling pressure. - Mitral valve: There was trivial regurgitation. - Pulmonary arteries: Systolic pressure could not be accurately   estimated.    ASSESSMENT AND PLAN  78 y.o. year old male here with gradual onset progressive short-term memory loss confusion and decline in ADLs, consistent with mild  dementia.  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  Abnormal spells of staring and zoning out are likely presyncope and hypotension related.  Complex partial seizures are possible but symptoms seem to have improved since blood pressure medicines have been adjusted.   MMSE 24/30 ADL 6/6 IADL 7/8   Dx:  1. TIA (transient ischemic attack)   2. Left arm weakness   3. Mild dementia [facility])     Virtual Visit via Video Note  I connected with Dustin Hamilton on 09/06/18 at  2:30 PM EDT by a video enabled telemedicine application and verified that I am speaking with the correct person using two identifiers.   I discussed the limitations of evaluation and management by telemedicine and the availability of in person appointments. The patient expressed understanding and agreed to proceed.  I discussed the assessment and treatment plan with the patient. The patient was provided an opportunity to ask questions and all were answered. The patient agreed with the plan and demonstrated an understanding of the instructions.   The patient was advised to call back or seek an in-person evaluation if the symptoms worsen or if the condition fails to improve as anticipated.  I provided 25 minutes of non-face-to-face time during this encounter.   PLAN:  LEFT ARM NUMBNESS / WEAKNESS / SLURRED SPEECH (transient; 30 minutes; likely TIA vs small stroke) - check MRI brain and carotid u/s (TTE done last month at [place]) - continue plavix and BP control - follow up with PCP re: lipid and diabetes screening - stroke recovery exercises reviewed  DEMENTIA (VASCULAR + neurodegenerative / alzheimer's) - continue donepezil + memantine - safety / supervision issues reviewed - NO driving; caution with finances and medication mgmt  Orders Placed This Encounter  Procedures  . MR BRAIN WO CONTRAST   Return in about 6 months (around 03/08/2019).    Penni Bombard, MD 06/08/7937, 0:30 PM Certified  in Neurology, Neurophysiology and Neuroimaging  [facility] [address], [place], [place] [address] [phone]

## 2018-09-06 NOTE — Telephone Encounter (Signed)
The above patient or their representative was contacted and gave the following answers to these questions:         Do you have any of the following symptoms? No  Fever                    Cough                   Shortness of breath  Do  you have any of the following other symptoms? No    muscle pain         vomiting,        diarrhea        rash         weakness        red eye        abdominal pain         bruising          bruising or bleeding              joint pain           severe headache    Have you been in contact with someone who was or has been sick in the past 2 weeks? No  Yes                 Unsure                         Unable to assess   Does the person that you were in contact with have any of the following symptoms? N/A  Cough         shortness of breath           muscle pain         vomiting,            diarrhea            rash            weakness           fever            red eye           abdominal pain           bruising  or  bleeding                joint pain                severe headache               Have you  or someone you have been in contact with traveled internationally in th last month? No        If yes, which countries? N/A   Have you  or someone you have been in contact with traveled outside [place] in th last month?  No       If yes, which state and city? N/A   COMMENTS OR ACTION PLAN FOR THIS PATIENT:          

## 2018-09-07 ENCOUNTER — Telehealth: Payer: Self-pay | Admitting: Diagnostic Neuroimaging

## 2018-09-07 ENCOUNTER — Other Ambulatory Visit: Payer: Self-pay

## 2018-09-07 ENCOUNTER — Ambulatory Visit (HOSPITAL_COMMUNITY)
Admission: RE | Admit: 2018-09-07 | Discharge: 2018-09-07 | Disposition: A | Discharge status: Home / Self Care | Payer: Medicare PPO | Source: Ambulatory Visit | Attending: Family | Admitting: Family

## 2018-09-07 DIAGNOSIS — G459 Transient cerebral ischemic attack, unspecified: Secondary | ICD-10-CM | POA: Diagnosis not present

## 2018-09-07 NOTE — Telephone Encounter (Signed)
Mcarthur Rossetti Josem Kaufmann: [id number] (exp. 09/07/18 to 12/06/18) order sent to GI. They will reach out to the pt to schedule.

## 2018-09-08 ENCOUNTER — Telehealth: Payer: Self-pay | Admitting: *Deleted

## 2018-09-08 NOTE — Telephone Encounter (Signed)
Called and spoke with [name], on DPR and informed her that his carotid US was an unremarkable study with no major findings. Dr [name] will continue with current plan. She asked about MRI; I advised her it will be done at a later date when safe (Covid 19) . She  verbalized understanding, appreciation.

## 2018-09-24 ENCOUNTER — Telehealth: Payer: Self-pay | Admitting: Neurology

## 2018-09-24 ENCOUNTER — Other Ambulatory Visit: Payer: Self-pay

## 2018-09-24 ENCOUNTER — Ambulatory Visit
Admission: RE | Admit: 2018-09-24 | Discharge: 2018-09-24 | Disposition: A | Discharge status: Home / Self Care | Payer: Medicare PPO | Source: Ambulatory Visit | Attending: Diagnostic Neuroimaging | Admitting: Diagnostic Neuroimaging

## 2018-09-24 DIAGNOSIS — G459 Transient cerebral ischemic attack, unspecified: Secondary | ICD-10-CM

## 2018-09-24 DIAGNOSIS — R4781 Slurred speech: Secondary | ICD-10-CM | POA: Diagnosis not present

## 2018-09-24 NOTE — Telephone Encounter (Signed)
I called the patient.  I got a call from Dr. Jola Baptist regarding MRI of the brain.  The MRI shows evidence of a right centrum semiovale stroke event.  This would explain the patient's new symptoms of left-sided arm weakness and numbness, difficulty walking, and slurred speech.  The patient has had 3 prior stroke events.  He is on Plavix.  He is to remain on Plavix, he has had a recent carotid Doppler study and 2D echocardiogram.  He may require a 30-day cardiac monitor study.  I have indicated he is to stay on his Plavix, if he has any new symptoms, he is to call 911 and go to the emergency room.  The actual stroke event likely occurred over 3 weeks ago.

## 2018-09-27 NOTE — Telephone Encounter (Signed)
Pts wife called in and wanted to know what precautions should they take and what do they need to do further due to pts MRI

## 2018-09-27 NOTE — Telephone Encounter (Addendum)
Called wife, Dustin Hamilton and advised her of Dr [employer] advice. I reviewed Dr Gladstone Lighter notes, plan from 4/6 with her. She asked if Dr [name] needed a phone follow up with them. I advised her he didn't indicate in his reply to me he felt that was needed at this time. I advised Dr Jannifer Franklin sent message to Dr [name] who reviewed all notes. I then  advised she call PCP or our office for any concerns, changes in his symptoms, problems. She asked about FU; I informed her he has 6 month FU in Oct. She stated she felt nervous; I reassured he rot call PCP or our office for any concerns. She  verbalized understanding, appreciation.

## 2018-09-27 NOTE — Telephone Encounter (Signed)
Continue current CDC guideline COVID precautions.   Continue exercises to help with stroke recovery.   -VRP

## 2018-09-29 NOTE — Telephone Encounter (Addendum)
Received call from wife stating she called Dr Patrice Paradise, neurologist, [facility] on behalf of her husband with questions about his medications. Dr Patrice Paradise, his [place] provider requested copies of MRI brain and Vas US carotid. The wife stated he said he needed copies to review when she asked about his medications. She stated she wanted to be sure he was still supposed to be taking them . I advised her that per Dr Jannifer Franklin' phone call to patient on 09/24/18, he told patient to continue taking Plavix. I advised her that his PCP would need to give advice on other medications relating to lipids, BP, etc. She verbalized understanding. I advised will fax reports as she requested, Dr Debby Bud, F [phone]. She verbalized appreciation. Reports successfully faxed.

## 2018-10-11 DIAGNOSIS — N182 Chronic kidney disease, stage 2 (mild): Secondary | ICD-10-CM | POA: Diagnosis not present

## 2018-10-11 DIAGNOSIS — N183 Chronic kidney disease, stage 3 (moderate): Secondary | ICD-10-CM | POA: Diagnosis not present

## 2018-10-11 DIAGNOSIS — I10 Essential (primary) hypertension: Secondary | ICD-10-CM | POA: Diagnosis not present

## 2018-10-12 DIAGNOSIS — N390 Urinary tract infection, site not specified: Secondary | ICD-10-CM | POA: Diagnosis not present

## 2018-10-12 DIAGNOSIS — R809 Proteinuria, unspecified: Secondary | ICD-10-CM | POA: Diagnosis not present

## 2018-10-12 DIAGNOSIS — I10 Essential (primary) hypertension: Secondary | ICD-10-CM | POA: Diagnosis not present

## 2018-10-14 ENCOUNTER — Emergency Department (HOSPITAL_COMMUNITY)
Admission: EM | Admit: 2018-10-14 | Discharge: 2018-10-14 | Disposition: A | Discharge status: Home / Self Care | Payer: Medicare PPO | Attending: Emergency Medicine | Admitting: Emergency Medicine

## 2018-10-14 ENCOUNTER — Emergency Department (HOSPITAL_COMMUNITY): Payer: Medicare PPO

## 2018-10-14 ENCOUNTER — Other Ambulatory Visit: Payer: Self-pay

## 2018-10-14 ENCOUNTER — Encounter (HOSPITAL_COMMUNITY): Payer: Self-pay | Admitting: Emergency Medicine

## 2018-10-14 DIAGNOSIS — I10 Essential (primary) hypertension: Secondary | ICD-10-CM | POA: Diagnosis not present

## 2018-10-14 DIAGNOSIS — Z8673 Personal history of transient ischemic attack (TIA), and cerebral infarction without residual deficits: Secondary | ICD-10-CM | POA: Insufficient documentation

## 2018-10-14 DIAGNOSIS — R61 Generalized hyperhidrosis: Secondary | ICD-10-CM | POA: Diagnosis not present

## 2018-10-14 DIAGNOSIS — Z87891 Personal history of nicotine dependence: Secondary | ICD-10-CM | POA: Insufficient documentation

## 2018-10-14 DIAGNOSIS — R55 Syncope and collapse: Secondary | ICD-10-CM

## 2018-10-14 DIAGNOSIS — R0902 Hypoxemia: Secondary | ICD-10-CM | POA: Diagnosis not present

## 2018-10-14 DIAGNOSIS — Z79899 Other long term (current) drug therapy: Secondary | ICD-10-CM | POA: Diagnosis not present

## 2018-10-14 DIAGNOSIS — R231 Pallor: Secondary | ICD-10-CM | POA: Diagnosis not present

## 2018-10-14 DIAGNOSIS — E860 Dehydration: Secondary | ICD-10-CM | POA: Diagnosis not present

## 2018-10-14 DIAGNOSIS — I471 Supraventricular tachycardia: Secondary | ICD-10-CM | POA: Insufficient documentation

## 2018-10-14 DIAGNOSIS — R42 Dizziness and giddiness: Secondary | ICD-10-CM | POA: Diagnosis not present

## 2018-10-14 LAB — URINALYSIS, ROUTINE W REFLEX MICROSCOPIC
Bilirubin Urine: NEGATIVE
Glucose, UA: NEGATIVE mg/dL
Ketones, ur: NEGATIVE mg/dL
Nitrite: NEGATIVE
Protein, ur: 100 mg/dL — AB
Specific Gravity, Urine: 1.009 (ref 1.005–1.030)
pH: 7 (ref 5.0–8.0)

## 2018-10-14 LAB — CBC WITH DIFFERENTIAL/PLATELET
Abs Immature Granulocytes: 0.02 10*3/uL (ref 0.00–0.07)
Basophils Absolute: 0 10*3/uL (ref 0.0–0.1)
Basophils Relative: 1 %
Eosinophils Absolute: 0.1 10*3/uL (ref 0.0–0.5)
Eosinophils Relative: 2 %
HCT: 49.2 % (ref 39.0–52.0)
Hemoglobin: 16.6 g/dL (ref 13.0–17.0)
Immature Granulocytes: 0 %
Lymphocytes Relative: 26 %
Lymphs Abs: 1.4 10*3/uL (ref 0.7–4.0)
MCH: 31 pg (ref 26.0–34.0)
MCHC: 33.7 g/dL (ref 30.0–36.0)
MCV: 91.8 fL (ref 80.0–100.0)
Monocytes Absolute: 0.4 10*3/uL (ref 0.1–1.0)
Monocytes Relative: 7 %
Neutro Abs: 3.5 10*3/uL (ref 1.7–7.7)
Neutrophils Relative %: 64 %
Platelets: 142 10*3/uL — ABNORMAL LOW (ref 150–400)
RBC: 5.36 MIL/uL (ref 4.22–5.81)
RDW: 14 % (ref 11.5–15.5)
WBC: 5.4 10*3/uL (ref 4.0–10.5)
nRBC: 0 % (ref 0.0–0.2)

## 2018-10-14 LAB — TROPONIN I: Troponin I: <0.03 ng/mL (ref <0.03)

## 2018-10-14 LAB — COMPREHENSIVE METABOLIC PANEL
ALT: 30 U/L (ref 0–44)
AST: 29 U/L (ref 15–41)
Albumin: 3.7 g/dL (ref 3.5–5.0)
Alkaline Phosphatase: 53 U/L (ref 38–126)
Anion gap: 10 (ref 5–15)
BUN: 13 mg/dL (ref 8–23)
CO2: 23 mmol/L (ref 22–32)
Calcium: 9.4 mg/dL (ref 8.9–10.3)
Chloride: 109 mmol/L (ref 98–111)
Creatinine, Ser: 1.22 mg/dL (ref 0.61–1.24)
GFR calc Af Amer: >60 mL/min (ref >60)
GFR calc non Af Amer: 57 mL/min — ABNORMAL LOW (ref >60)
Glucose, Bld: 109 mg/dL — ABNORMAL HIGH (ref 70–99)
Potassium: 3.8 mmol/L (ref 3.5–5.1)
Sodium: 142 mmol/L (ref 135–145)
Total Bilirubin: 0.7 mg/dL (ref 0.3–1.2)
Total Protein: 6.8 g/dL (ref 6.5–8.1)

## 2018-10-14 MED ORDER — SODIUM CHLORIDE 0.9 % IV BOLUS
500.0000 mL | Freq: Once | INTRAVENOUS | Status: AC
  Administered 2018-10-14: 13:00:00 500 mL via INTRAVENOUS

## 2018-10-14 NOTE — ED Provider Notes (Signed)
[facility] EMERGENCY DEPARTMENT Provider Note   CSN: [id number] Arrival date & time: 10/14/18  1230    History   Chief Complaint Chief Complaint  Patient presents with  . Near Syncope  . Tachycardia    HPI Dustin Hamilton is a 78 y.o. male.     The history is provided by the patient and medical records. No language interpreter was used.  Near Syncope    Dustin Hamilton is a 78 y.o. male who presents to the Emergency Department complaining of near syncope. He presents to the emergency department by EMS for evaluation following a near syncopal event that occurred earlier today. This morning he was trimming trees and did not drink very much water. He went to the bank and was standing in line and began to feel lightheaded and diaphoretic. He went to the car and felt as if he might pass out. His wife brought him to a fire department where he was found to be tachycardic with a heart rate of 130s. He was treated with 500 mL of normal saline prior to ED arrival. Currently he reports feeling improved. He denies any recent illnesses. She denies any chest pain, shortness of breath, headache, nausea, vomiting, abdominal pain, fevers, dysuria, black/bloody stool. He has experienced more episodes in the past. Past Medical History:  Diagnosis Date  . BPH (benign prostatic hyperplasia)   . Cerebrovascular disease   . Congenital anomaly of diaphragm   . Elevated PSA   . Glaucoma, both eyes   . Hemorrhoid   . Hepatitis B surface antigen positive    02-20-2011  . History of adenomatous polyp of colon    2007, 2009 and 2013  tubular adenoma's  . History of alcohol abuse    quit 1963  . History of cerebral parenchymal hemorrhage    01/ 2006  left occiptial lobe related to hypertensive crisis  . History of CVA (cerebrovascular accident)    09-12-2012  left hippocampus/ amygdala junction and per MRI old white matter infarcts--  per pt residual short- term memory issues   . History of fatty infiltration of liver hx visit's at [facility] , last visit 05/ 2014   elvated LFT's ,  via liver bx 2004 related to hx alcohol and drug abuse (quit 1964)  . 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 HTN (hypertension)   . Renal cyst, left   . Stroke ([place])    hx of 3 strokes in past   . 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 Urethral lesion    urethral mass    Patient Active Problem List   Diagnosis Date Noted  . PSVT (paroxysmal supraventricular tachycardia) ([place]) 06/03/2017  . Dizziness 08/02/2014  . 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 CVA (cerebral infarction) 09/13/2012  . Vertigo 09/12/2012  . EAR PAIN 09/05/2009  . BACK PAIN 06/20/2009  . COLONIC POLYPS 05/06/2007  . 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 NONDEPENDENT ALCOHOL ABUSE IN REMISSION 05/06/2007  . HYPERTENSION, SEVERE 05/06/2007  . Hypertensive heart disease without heart failure 05/06/2007  . INTRACRANIAL HEMORRHAGE 05/06/2007  . Cerebrovascular disease 05/06/2007  . HEMORRHOIDS 05/06/2007  . INGUINAL HERNIA, RIGHT 05/06/2007  . RENAL CYST 05/06/2007  . HEMATURIA UNSPECIFIED 05/06/2007  . 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 PSA, INCREASED 05/06/2007  . LIVER FUNCTION TESTS, ABNORMAL 05/06/2007  . BPH (benign prostatic hypertrophy) with urinary obstruction  05/06/2007  . PERS HX NONCOMPLIANCE W/MED TX PRS HAZARDS HLTH 05/06/2007    Past Surgical History:  Procedure Laterality Date  . CARDIOVASCULAR STRESS TEST  05/05/2007   normal nuclear study w/ no ischemia/  normal LV fucntion and wall motion , ef60%  . COLONOSCOPY  last one 04-06-2012  . CYSTO/  LEFT RETROGRADE PYELOGRAM/ CYTOLOGY WASHINGS/  URETEROSCOPY  03/05/2000  . INGUINAL HERNIA REPAIR Bilateral 1965 and 1980's  . LAPAROSCOPIC INGUINAL HERNIA WITH UMBILICAL HERNIA Right 38/75/6433  . LIVER BIOPSY  1980's and 2004  . TRANSTHORACIC  ECHOCARDIOGRAM  09/13/2012   moderate LVH,  ef 60-65%/    . TRANSURETHRAL RESECTION OF BLADDER TUMOR N/A 08/11/2016   Procedure: TRANSURETHRAL RESECTION OF BLADDER TUMOR (TURBT);  Surgeon: Cleon Gustin, MD;  Location: [facility];  Service: Urology;  Laterality: N/A;        Home Medications    Prior to Admission medications   Medication Sig Start Date End Date Taking? Authorizing Provider  amLODipine (NORVASC) 10 MG tablet TAKE ONE TABLET BY MOUTH EVERY DAY Patient taking differently: Take 10 mg by mouth daily.     Noralee Space, MD  benzonatate (TESSALON) 100 MG capsule Take 1 capsule (100 mg total) by mouth every 8 (eight) hours. 06/04/18   Wurst, [place], PA-C  brimonidine (ALPHAGAN) 0.2 % ophthalmic solution Place 1 drop into both eyes 2 (two) times daily.    [provider]  carvedilol (COREG) 25 MG tablet Take 12.5 mg by mouth 2 (two) times daily with a meal.     [provider]  cetirizine (ZYRTEC) 5 MG chewable tablet Chew 1 tablet (5 mg total) by mouth daily. 06/04/18   Wurst, [place], PA-C  clopidogrel (PLAVIX) 75 MG tablet Take 1 tablet (75 mg total) by mouth daily with breakfast. 05/06/13   Noralee Space, MD  donepezil (ARICEPT) 5 MG tablet Take 5 mg by mouth at bedtime.    [provider]  finasteride (PROSCAR) 5 MG tablet Take 5 mg by mouth daily.     [provider]  fluticasone (FLONASE) 50 MCG/ACT nasal spray Place 2 sprays into both nostrils daily. 06/04/18   Wurst, [place], PA-C  hydrALAZINE (APRESOLINE) 50 MG tablet Take 50 mg by mouth 2 (two) times daily.     [provider]  latanoprost (XALATAN) 0.005 % ophthalmic solution Place 1 drop into both eyes at bedtime.     [provider]  meclizine (ANTIVERT) 25 MG tablet Take 25 mg by mouth 2 (two) times daily as needed for dizziness.    [provider]  MEMANTINE HCL PO Take 20 mg by mouth at bedtime.     [provider]   oxyCODONE-acetaminophen (PERCOCET) 5-325 MG tablet Take 1 tablet by mouth every 4 (four) hours as needed for moderate pain. Patient not taking: Reported on 07/11/5186 08/31/64   Delora Fuel, MD  tadalafil (CIALIS) 20 MG tablet Take 1 tablet (20 mg total) by mouth daily as needed for erectile dysfunction. Patient not taking: Reported on 08/06/2018 05/06/13   Noralee Space, MD  tamsulosin (FLOMAX) 0.4 MG CAPS capsule Take 0.4 mg by mouth at bedtime.     [provider]  traMADol (ULTRAM) 50 MG tablet Take 1 tablet (50 mg total) by mouth every 6 (six) hours as needed. Patient taking differently: Take 50 mg by mouth every 6 (six) hours as needed.  08/11/16   McKenzie, Candee Furbish, MD    Family History Family  History  Problem Relation Age of Onset  . Rheum arthritis Mother   . Diabetes Mother   . Stroke Mother   . Heart attack Mother   . Kidney failure Mother   . Heart attack Father   . Heart disease Maternal Grandmother   . Rheum arthritis Maternal Grandmother   . Diabetes Maternal Grandmother   . Stroke Maternal Grandmother   . Colon cancer Neg Hx     Social History Social History   Tobacco Use  . Smoking status: Former Smoker    Packs/day: 1.00    Years: 5.00    Pack years: 5.00    Types: Cigarettes    Last attempt to quit: 11/30/1981    Years since quitting: 36.8  . Smokeless tobacco: Never Used  Substance Use Topics  . Alcohol use: No    Alcohol/week: 0.0 standard drinks    Comment: hx abuse -- quit:  1963  . Drug use: No    Comment: hx abuse -- quit 1964 (iv heroin and cocaine     Allergies   Penicillins   Review of Systems Review of Systems  Cardiovascular: Positive for near-syncope.  All other systems reviewed and are negative.    Physical Exam Updated Vital Signs BP 129/85   Pulse 75   Temp (!) 97.5 F (36.4 C) (Oral)   Resp (!) 21   SpO2 97%   Physical Exam Vitals signs and nursing note reviewed.  Constitutional:      Appearance: He is  well-developed.  HENT:     Head: Normocephalic and atraumatic.  Cardiovascular:     Rate and Rhythm: Regular rhythm.     Heart sounds: No murmur.     Comments: tachycardic Pulmonary:     Effort: Pulmonary effort is normal. No respiratory distress.     Breath sounds: Normal breath sounds.  Abdominal:     Palpations: Abdomen is soft.     Tenderness: There is no abdominal tenderness. There is no guarding or rebound.  Musculoskeletal:        General: No swelling or tenderness.  Skin:    General: Skin is warm and dry.  Neurological:     Mental Status: He is alert and oriented to person, place, and time.  Psychiatric:        Behavior: Behavior normal.      ED Treatments / Results  Labs (all labs ordered are listed, but only abnormal results are displayed) Labs Reviewed  COMPREHENSIVE METABOLIC PANEL - Abnormal; Notable for the following components:      Result Value   Glucose, Bld 109 (*)    GFR calc non Af Amer 57 (*)    All other components within normal limits  CBC WITH DIFFERENTIAL/PLATELET - Abnormal; Notable for the following components:   Platelets 142 (*)    All other components within normal limits  URINALYSIS, ROUTINE W REFLEX MICROSCOPIC - Abnormal; Notable for the following components:   Hgb urine dipstick SMALL (*)    Protein, ur 100 (*)    Leukocytes,Ua MODERATE (*)    Bacteria, UA RARE (*)    All other components within normal limits  URINE CULTURE  TROPONIN I    EKG EKG Interpretation  Date/Time:  Thursday Oct 14 2018 13:42:22 EDT Ventricular Rate:  77 PR Interval:    QRS Duration: 121 QT Interval:  359 QTC Calculation: 407 R Axis:   -87 Text Interpretation:  Sinus rhythm Prolonged PR interval Nonspecific IVCD with LAD Anterior infarct, old Borderline  T abnormalities, inferior leads Borderline ST elevation, lateral leads Confirmed by Quintella Reichert [phone]) on 10/14/2018 2:05:29 PM   Radiology Dg Chest 2 View  Result Date: 10/14/2018 CLINICAL  DATA:  Near syncopal episode. EXAM: CHEST - 2 VIEW COMPARISON:  08/06/2018 FINDINGS: Artifact overlies the chest. Chronic elevation of the right hemidiaphragm. Chronic left ventricular prominence and aortic tortuosity. Allowing for the chronic volume loss at the right base associated with the elevated hemidiaphragm, the lungs are clear. Vascularity is normal. No effusions. IMPRESSION: No active disease. Chronic elevation of the right hemidiaphragm with mild chronic volume loss at the right base. Electronically Signed   By: Nelson Chimes M.D.   On: 10/14/2018 13:03    Procedures Procedures (including critical care time)  Medications Ordered in ED Medications  sodium chloride 0.9 % bolus 500 mL (500 mLs Intravenous New Bag/Given 10/14/18 1325)     Initial Impression / Assessment and Plan / ED Course  I have reviewed the triage vital signs and the nursing notes.  Pertinent labs & imaging results that were available during my care of the patient were reviewed by me and considered in my medical decision making (see chart for details).        Patient here for evaluation following episode of near syncope. Patient noted be tachycardic on ED arrival, EKG with slow SVT. Patient converted to sinus rhythm during ED stay with IV fluids. Labs without any acute abnormalities. On multiple repeat assessments patient is feeling improved. He was evaluated in the emergency department by cardiology and EKG was refused by cardiologist -presenting rhythm consistent with slow SVT. Given patient is asymptomatic at this time plan to discharge home with outpatient Cardiology follow-up and return precautions.  Presentation is not c/w COVID, ACS, PE, dissection. Discussed findings of studies with patient and wife and they are in agreement with treatment plan.    Final Clinical Impressions(s) / ED Diagnoses   Final diagnoses:  SVT (supraventricular tachycardia) [facility])    ED Discharge Orders    None       Quintella Reichert, MD 10/14/18 1624

## 2018-10-14 NOTE — Discharge Instructions (Signed)
YOUR CARDIOLOGY TEAM HAS ARRANGED FOR AN E-VISIT FOR YOUR APPOINTMENT - PLEASE REVIEW IMPORTANT INFORMATION BELOW SEVERAL DAYS PRIOR TO YOUR APPOINTMENT  Due to the recent COVID-19 pandemic, we are transitioning in-person office visits to tele-medicine visits in an effort to decrease unnecessary exposure to our patients, their families, and staff. These visits are billed to your insurance just like a normal visit is. We also encourage you to sign up for MyChart if you have not already done so. You will need a smartphone if possible. For patients that do not have this, we can still complete the visit using a regular telephone but do prefer a smartphone to enable video when possible. You may have a family member that lives with you that can help. If possible, we also ask that you have a blood pressure cuff and scale at home to measure your blood pressure, heart rate and weight prior to your scheduled appointment. Patients with clinical needs that need an in-person evaluation and testing will still be able to come to the office if absolutely necessary. If you have any questions, feel free to call our office.     YOUR PROVIDER WILL BE USING THE FOLLOWING PLATFORM TO COMPLETE YOUR VISIT: One of our schedulers will contact you to find out the best visit for you based on your technology capabilities, either video visit through a smart phone or a simple telephone call from a land line or cell phone.    IF USING MYCHART - How to Download the MyChart App to Your SmartPhone   - If Apple, go to [employer] and type in MyChart in the search bar and download the app. If Android, ask patient to go to [employer] and type in [place] in the search bar and download the app. The app is free but as with any other app downloads, your phone may require you to verify saved payment information or Apple/Android password.  - You will need to then log into the app with your MyChart username and password, and select [facility] as your healthcare provider to link the account.  - When it is time for your visit, go to the MyChart app, find appointments, and click Begin Video Visit. Be sure to Select Allow for your device to access the Microphone and Camera for your visit. You will then be connected, and your provider will be with you shortly.  **If you have any issues connecting or need assistance, please contact MyChart service desk [phone])**  **If using a computer, in order to ensure the best quality for your visit, you will need to use either of the following Internet Browsers: Insurance underwriter or Microsoft Edge**   IF USING DOXIMITY or [email or web address] - The staff will give you instructions on receiving your link to join the meeting the day of your visit.      2-3 DAYS BEFORE YOUR APPOINTMENT  You will receive a telephone call from one of our [place] team members - your caller ID may say "Unknown caller." If this is a video visit, we will walk you through how to get the video launched on your phone. We will remind you check your blood pressure, heart rate and weight prior to your scheduled appointment. If you have an Apple Watch or Kardia, please upload any pertinent ECG strips the day before or morning of your appointment to [facility]. Our staff will also make sure you have reviewed the consent and agree to move forward with your scheduled tele-health  visit.     THE DAY OF YOUR APPOINTMENT  Approximately 15 minutes prior to your scheduled appointment, you will receive a telephone call from one of [place] team - your caller ID may say "Unknown caller."  Our staff will confirm medications, vital signs for the day and any symptoms you may be experiencing. Please have this information available prior to the time of visit start. It may also be helpful for you to have a pad of paper and pen handy for any instructions given during your visit. They will also walk you through joining the smartphone meeting if  this is a video visit.    CONSENT FOR TELE-HEALTH VISIT - PLEASE REVIEW  I hereby voluntarily request, consent and authorize [facility] and its employed or contracted physicians, physician assistants, nurse practitioners or other licensed health care professionals (the Practitioner), to provide me with telemedicine health care services (the Services") as deemed necessary by the treating Practitioner. I acknowledge and consent to receive the Services by the Practitioner via telemedicine. I understand that the telemedicine visit will involve communicating with the Practitioner through live audiovisual communication technology and the disclosure of certain medical information by electronic transmission. I acknowledge that I have been given the opportunity to request an in-person assessment or other available alternative prior to the telemedicine visit and am voluntarily participating in the telemedicine visit.  I understand that I have the right to withhold or withdraw my consent to the use of telemedicine in the course of my care at any time, without affecting my right to future care or treatment, and that the Practitioner or I may terminate the telemedicine visit at any time. I understand that I have the right to inspect all information obtained and/or recorded in the course of the telemedicine visit and may receive copies of available information for a reasonable fee.  I understand that some of the potential risks of receiving the Services via telemedicine include:   Delay or interruption in medical evaluation due to technological equipment failure or disruption;  Information transmitted may not be sufficient (e.g. poor resolution of images) to allow for appropriate medical decision making by the Practitioner; and/or   In rare instances, security protocols could fail, causing a breach of personal health information.  Furthermore, I acknowledge that it is my responsibility to provide information  about my medical history, conditions and care that is complete and accurate to the best of my ability. I acknowledge that Practitioner's advice, recommendations, and/or decision may be based on factors not within their control, such as incomplete or inaccurate data provided by me or distortions of diagnostic images or specimens that may result from electronic transmissions. I understand that the practice of medicine is not an exact science and that Practitioner makes no warranties or guarantees regarding treatment outcomes. I acknowledge that I will receive a copy of this consent concurrently upon execution via email to the email address I last provided but may also request a printed copy by calling the office of [facility].    I understand that my insurance will be billed for this visit.   I have read or had this consent read to me.  I understand the contents of this consent, which adequately explains the benefits and risks of the Services being provided via telemedicine.   I have been provided ample opportunity to ask questions regarding this consent and the Services and have had my questions answered to my satisfaction.  I give my informed consent for the services  to be provided through the use of telemedicine in my medical care  By participating in this telemedicine visit I agree to the above.

## 2018-10-14 NOTE — ED Notes (Signed)
Patient verbalizes understanding of discharge instructions. Opportunity for questioning and answers were provided. Armband removed by staff, pt discharged from ED. Pt wheeled to lobby and taken home by wife.

## 2018-10-14 NOTE — ED Notes (Signed)
Cardiology at [facility]

## 2018-10-14 NOTE — Consult Note (Signed)
Cardiology Consultation:   Patient ID: Dustin Hamilton MRN: [id number]; DOB: 1940-07-21  Admit date: 10/14/2018 Date of Consult: 10/14/2018  Primary Care Provider: No primary care provider on file. Primary Cardiologist: Quay Burow, MD  Primary Electrophysiologist:  Cristopher Peru, MD    Patient Profile:   Dustin Hamilton is a 78 y.o. male with a hx of HTN and PSVT who is being seen today for the evaluation of near syncope, at the request of Dr. Ralene Bathe, Emergency Medicine.   History of Present Illness:   Mr. Eppinger  has a history of treated hypertension. He's never had a heart attack or stroke. Has h/o SVT. He presented in the past to the [facility] emergency room on 05/30/17 with PSVT and hypotension. This broke with typical Valsalva maneuver. He was sent to Dr. Gwenlyn Found who referred him to Dr. Lovena Le. At that time, he denied any further recurrence. Dr. Tanna Furry note mentions that they decided on watchful waiting but to consider ablation if he had any frequent recurrence. Also of note, an echo was done 06/2017 which showed normal LVEF, G1DD and no significant valvular disease.   Of note, pt was seen recently in the ED 08/06/18 for palpitations and was noted by ED physician to be in SVT. This occurred after missing a dose of Coreg. Per Dr. Neomia Dear note "Patient with recurrent SVT.  Has history of same.  Did not have his Coreg this morning.  However his rate usually only goes around 130 with SVT but was hypotensive with it".  Adenosine was given and he converted. He was released from the ED and instructed to f/u with cardiology at the [facility] clinic.   Pt now presents back to the [facility] ED w CC of near syncope. Pt was outside earlier trimming trees and admits that he was not hydrating with fluids. He later went to the bank and while standing in line, felt lightheaded and became diaphoretic. Thought that he was going to pass out. His wife drove him to the closest fire department where he was  found to be tachycardic w/ HR in the 130s. Referred to the ED.   In the ED, Initial EKG showed Junctional tachycardia at 126 bpm, Incomplete RBBB and LAFB, ? Anterior infarct, old ST depr, consider ischemia, inferior leads Minimal ST elevation, lateral leads. He was hypotensive in the ED w/ SBP in the 80s and given IVFs with improvement in BP to normal. Troponin negative. K NWL. SCr 1.22 c/w baseline. Glucose 109. Cardiology asked to evaluate.   On my interview, he is in normal sinus rhythm, feeling well, asking to go home. He has not missed any doses of his medication. No clear triggers beyond activity/possible dehydration.   Denies chest pain, shortness of breath at rest or with normal exertion. No PND, orthopnea, LE edema or unexpected weight gain. No syncope or palpitations.  Past Medical History:  Diagnosis Date   BPH (benign prostatic hyperplasia)    Cerebrovascular disease    Congenital anomaly of diaphragm    Elevated PSA    Glaucoma, both eyes    Hemorrhoid    Hepatitis B surface antigen positive    02-20-2011   History of adenomatous polyp of colon    2007, 2009 and 2013  tubular adenoma's   History of alcohol abuse    quit 1963   History of cerebral parenchymal hemorrhage    01/ 2006  left occiptial lobe related to hypertensive crisis   History of CVA (cerebrovascular accident)  09-12-2012  left hippocampus/ amygdala junction and per MRI old white matter infarcts--  per pt residual short- term memory issues   History of fatty infiltration of liver hx visit's at [facility] , last visit 05/ 2014   elvated LFT's ,  via liver bx 2004 related to hx alcohol and drug abuse (quit 1964)   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   HTN (hypertension)    Renal cyst, left    Stroke ([place])    hx of 3 strokes in past    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    Urethral lesion    urethral mass    Past  Surgical History:  Procedure Laterality Date   CARDIOVASCULAR STRESS TEST  05/05/2007   normal nuclear study w/ no ischemia/  normal LV fucntion and wall motion , ef60%   COLONOSCOPY  last one 04-06-2012   CYSTO/  LEFT RETROGRADE PYELOGRAM/ CYTOLOGY WASHINGS/  URETEROSCOPY  03/05/2000   INGUINAL HERNIA REPAIR Bilateral 1965 and 1980's   LAPAROSCOPIC INGUINAL HERNIA WITH UMBILICAL HERNIA Right 95/62/1308   LIVER BIOPSY  1980's and 2004   TRANSTHORACIC ECHOCARDIOGRAM  09/13/2012   moderate LVH,  ef 60-65%/     TRANSURETHRAL RESECTION OF BLADDER TUMOR N/A 08/11/2016   Procedure: TRANSURETHRAL RESECTION OF BLADDER TUMOR (TURBT);  Surgeon: Cleon Gustin, MD;  Location: [facility];  Service: Urology;  Laterality: N/A;     Home Medications:  Prior to Admission medications   Medication Sig Start Date End Date Taking? Authorizing Provider  amLODipine (NORVASC) 10 MG tablet TAKE ONE TABLET BY MOUTH EVERY DAY Patient taking differently: Take 10 mg by mouth daily.     Noralee Space, MD  benzonatate (TESSALON) 100 MG capsule Take 1 capsule (100 mg total) by mouth every 8 (eight) hours. 06/04/18   Wurst, [place], PA-C  brimonidine (ALPHAGAN) 0.2 % ophthalmic solution Place 1 drop into both eyes 2 (two) times daily.    [provider]  carvedilol (COREG) 25 MG tablet Take 12.5 mg by mouth 2 (two) times daily with a meal.     [provider]  cetirizine (ZYRTEC) 5 MG chewable tablet Chew 1 tablet (5 mg total) by mouth daily. 06/04/18   Wurst, [place], PA-C  clopidogrel (PLAVIX) 75 MG tablet Take 1 tablet (75 mg total) by mouth daily with breakfast. 05/06/13   Noralee Space, MD  donepezil (ARICEPT) 5 MG tablet Take 5 mg by mouth at bedtime.    [provider]  finasteride (PROSCAR) 5 MG tablet Take 5 mg by mouth daily.     [provider]  fluticasone (FLONASE) 50 MCG/ACT nasal spray Place 2 sprays into both nostrils daily. 06/04/18   Wurst,  [place], PA-C  hydrALAZINE (APRESOLINE) 50 MG tablet Take 50 mg by mouth 2 (two) times daily.     [provider]  latanoprost (XALATAN) 0.005 % ophthalmic solution Place 1 drop into both eyes at bedtime.     [provider]  meclizine (ANTIVERT) 25 MG tablet Take 25 mg by mouth 2 (two) times daily as needed for dizziness.    [provider]  MEMANTINE HCL PO Take 20 mg by mouth at bedtime.     [provider]  oxyCODONE-acetaminophen (PERCOCET) 5-325 MG tablet Take 1 tablet by mouth every 4 (four) hours as needed for moderate pain. Patient not taking: Reported on 08/06/2018 07/06/17  Delora Fuel, MD  tadalafil (CIALIS) 20 MG tablet Take 1 tablet (20 mg total) by mouth daily as needed for erectile dysfunction. Patient not taking: Reported on 08/06/2018 05/06/13   Noralee Space, MD  tamsulosin (FLOMAX) 0.4 MG CAPS capsule Take 0.4 mg by mouth at bedtime.     [provider]  traMADol (ULTRAM) 50 MG tablet Take 1 tablet (50 mg total) by mouth every 6 (six) hours as needed. Patient taking differently: Take 50 mg by mouth every 6 (six) hours as needed.  08/11/16   McKenzie, Candee Furbish, MD    Inpatient Medications: Scheduled Meds:  Continuous Infusions:  PRN Meds:   Allergies:    Allergies  Allergen Reactions   Penicillins Hives    Has patient had a PCN reaction causing immediate rash, facial/tongue/throat swelling, SOB or lightheadedness with hypotension: Yes Has patient had a PCN reaction causing severe rash involving mucus membranes or skin necrosis: Yes Has patient had a PCN reaction that required hospitalization No Has patient had a PCN reaction occurring within the last 10 years: No If all of the above answers are "NO", then may proceed with Cephalosporin use.     Social History:   Social History   Socioeconomic History   Marital status: Married    Spouse name: [name]   Number of children: 1   Years of education: [employer]  education level: Not on file  Occupational History   Occupation: Banker strain: Not on file   Food insecurity:    Worry: Not on file    Inability: Not on file   Transportation needs:    Medical: Not on file    Non-medical: Not on file  Tobacco Use   Smoking status: Former Smoker    Packs/day: 1.00    Years: 5.00    Pack years: 5.00    Types: Cigarettes    Last attempt to quit: 11/30/1981    Years since quitting: 36.8   Smokeless tobacco: Never Used  Substance and Sexual Activity   Alcohol use: No    Alcohol/week: 0.0 standard drinks    Comment: hx abuse -- quit:  1963   Drug use: No    Comment: hx abuse -- quit 1964 (iv heroin and cocaine   Sexual activity: Not on file  Lifestyle   Physical activity:    Days per week: Not on file    Minutes per session: Not on file   Stress: Not on file  Relationships   Social connections:    Talks on phone: Not on file    Gets together: Not on file    Attends religious service: Not on file    Active member of club or organization: Not on file    Attends meetings of clubs or organizations: Not on file    Relationship status: Not on file   Intimate partner violence:    Fear of current or ex partner: Not on file    Emotionally abused: Not on file    Physically abused: Not on file    Forced sexual activity: Not on file  Other Topics Concern   Not on file  Social History Narrative   Patient lives at home with his spouse.   Caffeine Use:  Tea, lots    Family History:    Family History  Problem Relation Age of Onset   Rheum arthritis Mother    Diabetes Mother    Stroke Mother  Heart attack Mother    Kidney failure Mother    Heart attack Father    Heart disease Maternal Grandmother    Rheum arthritis Maternal Grandmother    Diabetes Maternal Grandmother    Stroke Maternal Grandmother    Colon cancer Neg Hx      ROS:  Please see the history of present  illness.  Constitutional: Negative for chills, fever, night sweats, unintentional weight loss  HENT: Negative for ear pain and hearing loss.   Eyes: Negative for loss of vision and eye pain.  Respiratory: Negative for cough, sputum, wheezing.   Cardiovascular: See HPI. Gastrointestinal: Negative for abdominal pain, melena, and hematochezia.  Genitourinary: Negative for dysuria and hematuria.  Musculoskeletal: Negative for falls and myalgias.  Skin: Negative for itching and rash.  Neurological: Negative for focal weakness, focal sensory changes and loss of consciousness.  Endo/Heme/Allergies: Does not bruise/bleed easily.  All other ROS reviewed and negative.     Physical Exam/Data:   Vitals:   10/14/18 1500 10/14/18 1515 10/14/18 1530 10/14/18 1545  BP: (!) 141/100  (!) 138/99 (!) 145/100  Pulse: 85 95 84 73  Resp: (!) 23  19 14   Temp:      TempSrc:      SpO2: 93%  93% 94%   No intake or output data in the 24 hours ending 10/14/18 1616 Last 3 Weights 08/06/2018 03/15/2018 10/03/2017  Weight (lbs) 171 lb 171 lb 6.4 oz 178 lb  Weight (kg) 77.565 kg 77.747 kg 80.74 kg     There is no height or weight on file to calculate BMI.  General:  Well nourished, well developed, in no acute distress HEENT: normal Lymph: no adenopathy Neck: no JVD Endocrine:  No thryomegaly Vascular: No carotid bruits; RA pulses 2+ bilaterally Cardiac:  normal S1, S2; RRR; no murmur  Lungs:  clear to auscultation bilaterally, no wheezing, rhonchi or rales  Abd: soft, nontender, no hepatomegaly  Ext: no edema Musculoskeletal:  No deformities, BUE and BLE strength normal and equal Skin: warm and dry  Neuro:  CNs 2-12 intact, no focal abnormalities noted Psych:  Normal affect   EKG:  The EKG was personally reviewed and demonstrates:  SVT with IVCD, retrograde P waves at 126 bpm  Telemetry:  Telemetry was personally reviewed and demonstrates:  SVT with abrupt offset and conversion to NSR.  Relevant CV  Studies: 2D Echo 06/2017 LV EF: 60% -   65%  ------------------------------------------------------------------- Indications:      PSVT (I47.1).  ------------------------------------------------------------------- History:   PMH:  Acquired from the patient and from the patient&'s chart.  Chest pain.  Stroke.  Risk factors:  Hypertension.  ------------------------------------------------------------------- Study Conclusions  - Left ventricle: The cavity size was normal. Systolic function was   normal. The estimated ejection fraction was in the range of 60%   to 65%. Wall motion was normal; there were no regional wall   motion abnormalities. There was an increased relative   contribution of atrial contraction to ventricular filling.   Doppler parameters are consistent with abnormal left ventricular   relaxation (grade 1 diastolic dysfunction). Doppler parameters   are consistent with high ventricular filling pressure. - Mitral valve: There was trivial regurgitation. - Pulmonary arteries: Systolic pressure could not be accurately   estimated.   Laboratory Data:  Chemistry Recent Labs  Lab 10/14/18 1240  NA 142  K 3.8  CL 109  CO2 23  GLUCOSE 109*  BUN 13  CREATININE 1.22  CALCIUM 9.4  GFRNONAA  57*  GFRAA >60  ANIONGAP 10    Recent Labs  Lab 10/14/18 1240  PROT 6.8  ALBUMIN 3.7  AST 29  ALT 30  ALKPHOS 53  BILITOT 0.7   Hematology Recent Labs  Lab 10/14/18 1240  WBC 5.4  RBC 5.36  HGB 16.6  HCT 49.2  MCV 91.8  MCH 31.0  MCHC 33.7  RDW 14.0  PLT 142*   Cardiac Enzymes Recent Labs  Lab 10/14/18 1240  TROPONINI <0.03   No results for input(s): TROPIPOC in the last 168 hours.  BNPNo results for input(s): BNP, PROBNP in the last 168 hours.  DDimer No results for input(s): DDIMER in the last 168 hours.  Radiology/Studies:  Dg Chest 2 View  Result Date: 10/14/2018 CLINICAL DATA:  Near syncopal episode. EXAM: CHEST - 2 VIEW COMPARISON:   08/06/2018 FINDINGS: Artifact overlies the chest. Chronic elevation of the right hemidiaphragm. Chronic left ventricular prominence and aortic tortuosity. Allowing for the chronic volume loss at the right base associated with the elevated hemidiaphragm, the lungs are clear. Vascularity is normal. No effusions. IMPRESSION: No active disease. Chronic elevation of the right hemidiaphragm with mild chronic volume loss at the right base. Electronically Signed   By: Nelson Chimes M.D.   On: 10/14/2018 13:03    Assessment and Plan:   BELLAMY JUDSON is a 78 y.o. male with a hx of HTN and PSVT who is being seen today for the evaluation of near syncope, at the request of Dr. Ralene Bathe, Emergency Medicine.   1. Near Syncope, pSVT: pt w/ past history of symptomatic SVT w/ hypotension that required ED evaluation back in Dec 2018 and again most recently on 08/06/2018. He required adenosine to break SVT this past March. Echo in 2019 showed normal LVEF and no significant valvular disease. ? If episode today prior to arrival to the ED was due to recurrent SVT. EKG in the ED captured [facility] c/w his prior SVT (wide due to underlying conduction disease) w/ HR in the upper 120s. Also hypotensive earlier and required IVFs. Pt was outside earlier today doing yard work and admits to not hydrating properly with fluids. His SCr is WNL at 1.22 and c/w his usual baseline. K is WNL. H/H normal. Troponin is negative.   -rapid offset, retrograde P waves support SVT. Wide due to underlying conduction disease. -educated about vagal maneuvers, retaught today -remains in NSR, feeling well -instructed to continue carvedilol -ok to d/c home with plans to f/u with EP to discuss catheter ablation. We will arrange this. -encourage hydration and avoidance of triggers/stimulants.   For questions or updates, please contact [place] Please consult [email or web address] for contact info under   Signed, Buford Dresser, MD  10/14/2018 4:16 PM

## 2018-10-14 NOTE — ED Triage Notes (Signed)
Pt arrives EMS with co tachycardia and syncope, dizzy, clammy, & feeling faint. Never LOC. Pt thinks he is dehydrated. Sinus Tach with a bundle branch block.   A&O x4 BP 97/70 Hr 130 rr 18 O2 94%  Temp 97.8 CBG 122 566ml fluid bolus

## 2018-10-16 LAB — URINE CULTURE: Culture: 40000 — AB

## 2018-10-20 ENCOUNTER — Other Ambulatory Visit: Payer: Self-pay

## 2018-10-20 ENCOUNTER — Telehealth (INDEPENDENT_AMBULATORY_CARE_PROVIDER_SITE_OTHER): Payer: Medicare PPO | Admitting: Internal Medicine

## 2018-10-20 ENCOUNTER — Telehealth: Payer: Self-pay

## 2018-10-20 DIAGNOSIS — I471 Supraventricular tachycardia: Secondary | ICD-10-CM | POA: Diagnosis not present

## 2018-10-20 DIAGNOSIS — R55 Syncope and collapse: Secondary | ICD-10-CM | POA: Diagnosis not present

## 2018-10-20 NOTE — Progress Notes (Signed)
Electrophysiology TeleHealth Note   Due to national recommendations of social distancing due to COVID 19, an audio/video telehealth visit is felt to be most appropriate for this patient at this time.  See MyChart message from today for the patient's consent to telehealth for [facility].   Date:  10/20/2018   ID:  Dustin Hamilton, DOB 03/08/1941, MRN [id number]  Location: patient's home  Provider location: [address], [place]  Evaluation Performed: Follow-up visit  PCP:  Patient, No Pcp Per  Cardiologist:  Quay Burow, MD  Electrophysiologist:  Dr Lovena Le  Chief Complaint:  "Ive been passing out."  History of Present Illness:    Dustin Hamilton is a 78 y.o. male who presents via audio/video conferencing for a telehealth visit today.He has a h/o SVT and I saw him over a year ago. At that time he had been mostly asymptomatic. He has had 2 episodes in the past 2 months resulting in an episode of syncope. Both have stopped on there own. The p wave morpholgy and location could not be know for sure.Today, he denies symptoms of chest pain, shortness of breath,  lower extremity edema, dizziness, presyncope, or syncope.  The patient is otherwise without complaint today.  The patient denies symptoms of fevers, chills, cough, or new SOB worrisome for COVID 19.  Past Medical History:  Diagnosis Date  . BPH (benign prostatic hyperplasia)   . Cerebrovascular disease   . Congenital anomaly of diaphragm   . Elevated PSA   . Glaucoma, both eyes   . Hemorrhoid   . Hepatitis B surface antigen positive    02-20-2011  . History of adenomatous polyp of colon    2007, 2009 and 2013  tubular adenoma's  . History of alcohol abuse    quit 1963  . History of cerebral parenchymal hemorrhage    01/ 2006  left occiptial lobe related to hypertensive crisis  . History of CVA (cerebrovascular accident)    09-12-2012  left hippocampus/ amygdala junction and per MRI old white  matter infarcts--  per pt residual short- term memory issues  . History of fatty infiltration of liver hx visit's at [facility] , last visit 05/ 2014   elvated LFT's ,  via liver bx 2004 related to hx alcohol and drug abuse (quit 1964)  . 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 HTN (hypertension)   . Renal cyst, left   . Stroke ([place])    hx of 3 strokes in past   . 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 Urethral lesion    urethral mass    Past Surgical History:  Procedure Laterality Date  . CARDIOVASCULAR STRESS TEST  05/05/2007   normal nuclear study w/ no ischemia/  normal LV fucntion and wall motion , ef60%  . COLONOSCOPY  last one 04-06-2012  . CYSTO/  LEFT RETROGRADE PYELOGRAM/ CYTOLOGY WASHINGS/  URETEROSCOPY  03/05/2000  . INGUINAL HERNIA REPAIR Bilateral 1965 and 1980's  . LAPAROSCOPIC INGUINAL HERNIA WITH UMBILICAL HERNIA Right 26/20/3559  . LIVER BIOPSY  1980's and 2004  . TRANSTHORACIC ECHOCARDIOGRAM  09/13/2012   moderate LVH,  ef 60-65%/    . TRANSURETHRAL RESECTION OF BLADDER TUMOR N/A 08/11/2016   Procedure: TRANSURETHRAL RESECTION OF BLADDER TUMOR (TURBT);  Surgeon: Cleon Gustin, MD;  Location: [facility];  Service: Urology;  Laterality: N/A;  Current Outpatient Medications  Medication Sig Dispense Refill  . amLODipine (NORVASC) 10 MG tablet TAKE ONE TABLET BY MOUTH EVERY DAY (Patient taking differently: Take 10 mg by mouth daily. ) 90 tablet 0  . benzonatate (TESSALON) 100 MG capsule Take 1 capsule (100 mg total) by mouth every 8 (eight) hours. 21 capsule 0  . brimonidine (ALPHAGAN) 0.2 % ophthalmic solution Place 1 drop into both eyes 2 (two) times daily.    . carvedilol (COREG) 25 MG tablet Take 12.5 mg by mouth 2 (two) times daily with a meal.     . cetirizine (ZYRTEC) 5 MG chewable tablet Chew 1 tablet (5 mg total) by mouth daily. 20 tablet 0  . clopidogrel (PLAVIX) 75 MG  tablet Take 1 tablet (75 mg total) by mouth daily with breakfast. 30 tablet 11  . donepezil (ARICEPT) 5 MG tablet Take 5 mg by mouth at bedtime.    . finasteride (PROSCAR) 5 MG tablet Take 5 mg by mouth daily.     . fluticasone (FLONASE) 50 MCG/ACT nasal spray Place 2 sprays into both nostrils daily. 16 g 0  . hydrALAZINE (APRESOLINE) 50 MG tablet Take 50 mg by mouth 2 (two) times daily.     Marland Kitchen latanoprost (XALATAN) 0.005 % ophthalmic solution Place 1 drop into both eyes at bedtime.     . meclizine (ANTIVERT) 25 MG tablet Take 25 mg by mouth 2 (two) times daily as needed for dizziness.    Marland Kitchen MEMANTINE HCL PO Take 20 mg by mouth at bedtime.     Marland Kitchen oxyCODONE-acetaminophen (PERCOCET) 5-325 MG tablet Take 1 tablet by mouth every 4 (four) hours as needed for moderate pain. (Patient not taking: Reported on 08/06/2018) 10 tablet 0  . tadalafil (CIALIS) 20 MG tablet Take 1 tablet (20 mg total) by mouth daily as needed for erectile dysfunction. (Patient not taking: Reported on 08/06/2018) 10 tablet 6  . tamsulosin (FLOMAX) 0.4 MG CAPS capsule Take 0.4 mg by mouth at bedtime.     . traMADol (ULTRAM) 50 MG tablet Take 1 tablet (50 mg total) by mouth every 6 (six) hours as needed. (Patient taking differently: Take 50 mg by mouth every 6 (six) hours as needed. ) 30 tablet 0   No current facility-administered medications for this visit.     Allergies:   Penicillins   Social History:  The patient  reports that he quit smoking about 36 years ago. His smoking use included cigarettes. He has a 5.00 pack-year smoking history. He has never used smokeless tobacco. He reports that he does not drink alcohol or use drugs.   Family History:  The patient's  family history includes Diabetes in his maternal grandmother and mother; Heart attack in his father and mother; Heart disease in his maternal grandmother; Kidney failure in his mother; Rheum arthritis in his maternal grandmother and mother; Stroke in his maternal grandmother  and mother.   ROS:  Please see the history of present illness.   All other systems are personally reviewed and negative.    Exam:    Vital Signs:  There were no vitals taken for this visit.  Well appearing, alert and conversant, regular work of breathing,  good skin color Eyes- anicteric, neuro- grossly intact, skin- no apparent rash or lesions or cyanosis, mouth- oral mucosa is pink   Labs/Other Tests and Data Reviewed:    Recent Labs: 10/14/2018: ALT 30; BUN 13; Creatinine, Ser 1.22; Hemoglobin 16.6; Platelets 142; Potassium 3.8; Sodium 142   Wt  Readings from Last 3 Encounters:  08/06/18 171 lb (77.6 kg)  03/15/18 171 lb 6.4 oz (77.7 kg)  10/03/17 178 lb (80.7 kg)     Other studies personally reviewed: Additional studies/ records that were reviewed today include: none   ASSESSMENT & PLAN:    1.  SVT - he is quite symptomatic when he goes into SVT. I have recommended EP study and catheter ablation to help prevent syncope. I have discussed the indications/risks/benefits/goals/expectations of EP study and ablation and he wishes to proceed. We will schedule a time to do this. 2. Syncope - his episodes coincide with his SVT.  3. COVID 19 screen The patient denies symptoms of COVID 19 at this time.  The importance of social distancing was discussed today.  Follow-up:  4 weeks after ablation Next remote: n/a  Current medicines are reviewed at length with the patient today.   The patient does not have concerns regarding his medicines.  The following changes were made today:  none  Labs/ tests ordered today include: preoperative labs No orders of the defined types were placed in this encounter.    Patient Risk:  after full review of this patients clinical status, I feel that they are at moderate risk at this time.  Today, I have spent 25 minutes with the patient with telehealth technology discussing catheter ablation and SVT .    Signed, Cristopher Peru, MD  10/20/2018 1:48  PM     [place] [address] [phone] (office) [phone] (fax)

## 2018-10-20 NOTE — H&P (View-Only) (Signed)
Electrophysiology TeleHealth Note   Due to national recommendations of social distancing due to COVID 19, an audio/video telehealth visit is felt to be most appropriate for this patient at this time.  See MyChart message from today for the patient's consent to telehealth for [facility].   Date:  10/20/2018   ID:  Dustin Hamilton, DOB [birth year], MRN [id number]  Location: patient's home  Provider location: [address] [place]  Evaluation Performed: Follow-up visit  PCP:  Patient, No Pcp Per  Cardiologist:  Quay Burow, MD  Electrophysiologist:  Dr Lovena Le  Chief Complaint:  "Ive been passing out."  History of Present Illness:    Dustin Hamilton is a 78 y.o. male who presents via audio/video conferencing for a telehealth visit today.He has a h/o SVT and I saw him over a year ago. At that time he had been mostly asymptomatic. He has had 2 episodes in the past 2 months resulting in an episode of syncope. Both have stopped on there own. The p wave morpholgy and location could not be know for sure.Today, he denies symptoms of chest pain, shortness of breath,  lower extremity edema, dizziness, presyncope, or syncope.  The patient is otherwise without complaint today.  The patient denies symptoms of fevers, chills, cough, or new SOB worrisome for COVID 19.  Past Medical History:  Diagnosis Date  . BPH (benign prostatic hyperplasia)   . Cerebrovascular disease   . Congenital anomaly of diaphragm   . Elevated PSA   . Glaucoma, both eyes   . Hemorrhoid   . Hepatitis B surface antigen positive    02-20-2011  . History of adenomatous polyp of colon    2007, 2009 and 2013  tubular adenoma's  . History of alcohol abuse    quit 1963  . History of cerebral parenchymal hemorrhage    01/ 2006  left occiptial lobe related to hypertensive crisis  . History of CVA (cerebrovascular accident)    09-12-2012  left hippocampus/ amygdala junction and per MRI old white  matter infarcts--  per pt residual short- term memory issues  . History of fatty infiltration of liver hx visit's at [facility] , last visit 05/ 2014   elvated LFT's ,  via liver bx 2004 related to hx alcohol and drug abuse (quit 1964)  . 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 HTN (hypertension)   . Renal cyst, left   . Stroke ([place])    hx of 3 strokes in past   . 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 Urethral lesion    urethral mass    Past Surgical History:  Procedure Laterality Date  . CARDIOVASCULAR STRESS TEST  05/05/2007   normal nuclear study w/ no ischemia/  normal LV fucntion and wall motion , ef60%  . COLONOSCOPY  last one 04-06-2012  . CYSTO/  LEFT RETROGRADE PYELOGRAM/ CYTOLOGY WASHINGS/  URETEROSCOPY  03/05/2000  . INGUINAL HERNIA REPAIR Bilateral 1965 and 1980's  . LAPAROSCOPIC INGUINAL HERNIA WITH UMBILICAL HERNIA Right 57/84/6962  . LIVER BIOPSY  1980's and 2004  . TRANSTHORACIC ECHOCARDIOGRAM  09/13/2012   moderate LVH,  ef 60-65%/    . TRANSURETHRAL RESECTION OF BLADDER TUMOR N/A 08/11/2016   Procedure: TRANSURETHRAL RESECTION OF BLADDER TUMOR (TURBT);  Surgeon: Cleon Gustin, MD;  Location[facility];  Service: Urology;  Laterality: N/A;  Current Outpatient Medications  Medication Sig Dispense Refill  . amLODipine (NORVASC) 10 MG tablet TAKE ONE TABLET BY MOUTH EVERY DAY (Patient taking differently: Take 10 mg by mouth daily. ) 90 tablet 0  . benzonatate (TESSALON) 100 MG capsule Take 1 capsule (100 mg total) by mouth every 8 (eight) hours. 21 capsule 0  . brimonidine (ALPHAGAN) 0.2 % ophthalmic solution Place 1 drop into both eyes 2 (two) times daily.    . carvedilol (COREG) 25 MG tablet Take 12.5 mg by mouth 2 (two) times daily with a meal.     . cetirizine (ZYRTEC) 5 MG chewable tablet Chew 1 tablet (5 mg total) by mouth daily. 20 tablet 0  . clopidogrel (PLAVIX) 75 MG  tablet Take 1 tablet (75 mg total) by mouth daily with breakfast. 30 tablet 11  . donepezil (ARICEPT) 5 MG tablet Take 5 mg by mouth at bedtime.    . finasteride (PROSCAR) 5 MG tablet Take 5 mg by mouth daily.     . fluticasone (FLONASE) 50 MCG/ACT nasal spray Place 2 sprays into both nostrils daily. 16 g 0  . hydrALAZINE (APRESOLINE) 50 MG tablet Take 50 mg by mouth 2 (two) times daily.     Marland Kitchen latanoprost (XALATAN) 0.005 % ophthalmic solution Place 1 drop into both eyes at bedtime.     . meclizine (ANTIVERT) 25 MG tablet Take 25 mg by mouth 2 (two) times daily as needed for dizziness.    Marland Kitchen MEMANTINE HCL PO Take 20 mg by mouth at bedtime.     Marland Kitchen oxyCODONE-acetaminophen (PERCOCET) 5-325 MG tablet Take 1 tablet by mouth every 4 (four) hours as needed for moderate pain. (Patient not taking: Reported on 08/06/2018) 10 tablet 0  . tadalafil (CIALIS) 20 MG tablet Take 1 tablet (20 mg total) by mouth daily as needed for erectile dysfunction. (Patient not taking: Reported on 08/06/2018) 10 tablet 6  . tamsulosin (FLOMAX) 0.4 MG CAPS capsule Take 0.4 mg by mouth at bedtime.     . traMADol (ULTRAM) 50 MG tablet Take 1 tablet (50 mg total) by mouth every 6 (six) hours as needed. (Patient taking differently: Take 50 mg by mouth every 6 (six) hours as needed. ) 30 tablet 0   No current facility-administered medications for this visit.     Allergies:   Penicillins   Social History:  The patient  reports that he quit smoking about 36 years ago. His smoking use included cigarettes. He has a 5.00 pack-year smoking history. He has never used smokeless tobacco. He reports that he does not drink alcohol or use drugs.   Family History:  The patient's  family history includes Diabetes in his maternal grandmother and mother; Heart attack in his father and mother; Heart disease in his maternal grandmother; Kidney failure in his mother; Rheum arthritis in his maternal grandmother and mother; Stroke in his maternal grandmother  and mother.   ROS:  Please see the history of present illness.   All other systems are personally reviewed and negative.    Exam:    Vital Signs:  There were no vitals taken for this visit.  Well appearing, alert and conversant, regular work of breathing,  good skin color Eyes- anicteric, neuro- grossly intact, skin- no apparent rash or lesions or cyanosis, mouth- oral mucosa is pink   Labs/Other Tests and Data Reviewed:    Recent Labs: 10/14/2018: ALT 30; BUN 13; Creatinine, Ser 1.22; Hemoglobin 16.6; Platelets 142; Potassium 3.8; Sodium 142   Wt  Readings from Last 3 Encounters:  08/06/18 171 lb (77.6 kg)  03/15/18 171 lb 6.4 oz (77.7 kg)  10/03/17 178 lb (80.7 kg)     Other studies personally reviewed: Additional studies/ records that were reviewed today include: none   ASSESSMENT & PLAN:    1.  SVT - he is quite symptomatic when he goes into SVT. I have recommended EP study and catheter ablation to help prevent syncope. I have discussed the indications/risks/benefits/goals/expectations of EP study and ablation and he wishes to proceed. We will schedule a time to do this. 2. Syncope - his episodes coincide with his SVT.  3. COVID 19 screen The patient denies symptoms of COVID 19 at this time.  The importance of social distancing was discussed today.  Follow-up:  4 weeks after ablation Next remote: n/a  Current medicines are reviewed at length with the patient today.   The patient does not have concerns regarding his medicines.  The following changes were made today:  none  Labs/ tests ordered today include: preoperative labs No orders of the defined types were placed in this encounter.    Patient Risk:  after full review of this patients clinical status, I feel that they are at moderate risk at this time.  Today, I have spent 25 minutes with the patient with telehealth technology discussing catheter ablation and SVT .    Signed, Cristopher Peru, MD  10/20/2018 1:48  PM     [address] [place] [id number] [phone] (office) [phone] (fax)

## 2018-10-21 ENCOUNTER — Telehealth: Payer: Self-pay

## 2018-10-21 DIAGNOSIS — I471 Supraventricular tachycardia: Secondary | ICD-10-CM

## 2018-10-21 NOTE — Telephone Encounter (Signed)
Call placed to Pt.  Pt schedule for ablation on June 15  Will get labs/virus test June 12  Instruction letter being mailed today

## 2018-11-03 ENCOUNTER — Ambulatory Visit (INDEPENDENT_AMBULATORY_CARE_PROVIDER_SITE_OTHER): Payer: Medicare PPO

## 2018-11-03 ENCOUNTER — Ambulatory Visit
Admission: EM | Admit: 2018-11-03 | Discharge: 2018-11-03 | Disposition: A | Discharge status: Home / Self Care | Payer: Medicare PPO | Attending: Physician Assistant | Admitting: Physician Assistant

## 2018-11-03 ENCOUNTER — Telehealth: Payer: Self-pay

## 2018-11-03 ENCOUNTER — Other Ambulatory Visit: Payer: Self-pay

## 2018-11-03 DIAGNOSIS — Z20822 Contact with and (suspected) exposure to covid-19: Secondary | ICD-10-CM

## 2018-11-03 DIAGNOSIS — J181 Lobar pneumonia, unspecified organism: Secondary | ICD-10-CM

## 2018-11-03 DIAGNOSIS — J189 Pneumonia, unspecified organism: Secondary | ICD-10-CM

## 2018-11-03 DIAGNOSIS — R05 Cough: Secondary | ICD-10-CM | POA: Diagnosis not present

## 2018-11-03 MED ORDER — LEVOFLOXACIN 750 MG PO TABS: 750.0000 mg | ORAL_TABLET | Freq: Every day | ORAL | 0 refills | Status: DC

## 2018-11-03 NOTE — ED Triage Notes (Signed)
Pt c/o dizziness for the past 2wks. States spoke to his PCP and [facility] about this but states everything was good. States was in the ED 2wks ago d/t hypotension.

## 2018-11-03 NOTE — Telephone Encounter (Signed)
Received  messaage from [place], [place] referring Pt for Covid-19 Testing.  Telephone call to Pt.   To schedule  Appointment.  Appt. Is for 11/04/18@ 0800am.  Voiced understanding.

## 2018-11-03 NOTE — ED Provider Notes (Addendum)
[name]    CSN: [id number] Arrival date & time: 11/03/18  1231     History   Chief Complaint Chief Complaint  Patient presents with  . Dizziness    HPI Dustin Hamilton is a 78 y.o. male.   78 year old male with history of CVA, cerebral parenchymal hemorrhage, HTN, SVT, comes in with wife for evaluation. When asked what brought him in, patient stated "my wife", therefore HPI obtained by patient and wife. Patient with history of SVT that has now been symptomatic with scheduled ablation 11/15/2018. He has had dizziness, lightheadedness thought to be secondary to SVT. Last ED visit 10/14/2018. Denies any worsening SOB since last ED visit. Denies chest pain, palpitations, syncope. Denies nausea, vomiting. States dizziness seems to be stable since then as well.   Wife states for the past week, patient has been having dry cough, "hot flashes", and occasionally looks more fatigued than normal. Patient denies shortness of breath, leg swelling, wheezing. Denies orthopnea, chest pain. Denies rhinorrhea, nasal congestion. Wife states patient has been sneezing. Former smoker. No obvious exposure to COVID. Wife states given he has been found hypotensive during ED visits, also wanted to make sure his BP isn't too low.      Past Medical History:  Diagnosis Date  . BPH (benign prostatic hyperplasia)   . Cerebrovascular disease   . Congenital anomaly of diaphragm   . Elevated PSA   . Glaucoma, both eyes   . Hemorrhoid   . Hepatitis B surface antigen positive    02-20-2011  . History of adenomatous polyp of colon    2007, 2009 and 2013  tubular adenoma's  . History of alcohol abuse    quit 1963  . History of cerebral parenchymal hemorrhage    01/ 2006  left occiptial lobe related to hypertensive crisis  . History of CVA (cerebrovascular accident)    09-12-2012  left hippocampus/ amygdala junction and per MRI old white matter infarcts--  per pt residual short- term memory  issues  . History of fatty infiltration of liver hx visit's at [facility] , last visit 05/ 2014   elvated LFT's ,  via liver bx 2004 related to hx alcohol and drug abuse (quit 1964)  . 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 HTN (hypertension)   . Renal cyst, left   . Stroke ([place])    hx of 3 strokes in past   . 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 Urethral lesion    urethral mass    Patient Active Problem List   Diagnosis Date Noted  . PSVT (paroxysmal supraventricular tachycardia) ([place]) 06/03/2017  . Dizziness 08/02/2014  . 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 CVA (cerebral infarction) 09/13/2012  . Vertigo 09/12/2012  . EAR PAIN 09/05/2009  . BACK PAIN 06/20/2009  . COLONIC POLYPS 05/06/2007  . 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 NONDEPENDENT ALCOHOL ABUSE IN REMISSION 05/06/2007  . HYPERTENSION, SEVERE 05/06/2007  . Hypertensive heart disease without heart failure 05/06/2007  . INTRACRANIAL HEMORRHAGE 05/06/2007  . Cerebrovascular disease 05/06/2007  . HEMORRHOIDS 05/06/2007  . INGUINAL HERNIA, RIGHT 05/06/2007  . RENAL CYST 05/06/2007  . HEMATURIA UNSPECIFIED 05/06/2007  . 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 PSA, INCREASED 05/06/2007  . LIVER FUNCTION TESTS, ABNORMAL 05/06/2007  . BPH (benign prostatic hypertrophy) with urinary obstruction 05/06/2007  . PERS HX NONCOMPLIANCE W/MED TX PRS HAZARDS  HLTH 05/06/2007    Past Surgical History:  Procedure Laterality Date  . CARDIOVASCULAR STRESS TEST  05/05/2007   normal nuclear study w/ no ischemia/  normal LV fucntion and wall motion , ef60%  . COLONOSCOPY  last one 04-06-2012  . CYSTO/  LEFT RETROGRADE PYELOGRAM/ CYTOLOGY WASHINGS/  URETEROSCOPY  03/05/2000  . INGUINAL HERNIA REPAIR Bilateral 1965 and 1980's  . LAPAROSCOPIC INGUINAL HERNIA WITH UMBILICAL HERNIA Right 30/16/0109  . LIVER BIOPSY  1980's and 2004  . TRANSTHORACIC  ECHOCARDIOGRAM  09/13/2012   moderate LVH,  ef 60-65%/    . TRANSURETHRAL RESECTION OF BLADDER TUMOR N/A 08/11/2016   Procedure: TRANSURETHRAL RESECTION OF BLADDER TUMOR (TURBT);  Surgeon: Cleon Gustin, MD;  Location: [facility];  Service: Urology;  Laterality: N/A;       Home Medications    Prior to Admission medications   Medication Sig Start Date End Date Taking? Authorizing Provider  amLODipine (NORVASC) 10 MG tablet TAKE ONE TABLET BY MOUTH EVERY DAY Patient taking differently: Take 10 mg by mouth daily.     Noralee Space, MD  benzonatate (TESSALON) 100 MG capsule Take 1 capsule (100 mg total) by mouth every 8 (eight) hours. 06/04/18   Wurst, [place], PA-C  brimonidine (ALPHAGAN) 0.2 % ophthalmic solution Place 1 drop into both eyes 2 (two) times daily.    [provider]  carvedilol (COREG) 25 MG tablet Take 12.5 mg by mouth 2 (two) times daily with a meal.     [provider]  cetirizine (ZYRTEC) 5 MG chewable tablet Chew 1 tablet (5 mg total) by mouth daily. 06/04/18   Wurst, [place], PA-C  clopidogrel (PLAVIX) 75 MG tablet Take 1 tablet (75 mg total) by mouth daily with breakfast. 05/06/13   Noralee Space, MD  donepezil (ARICEPT) 5 MG tablet Take 5 mg by mouth at bedtime.    [provider]  finasteride (PROSCAR) 5 MG tablet Take 5 mg by mouth daily.     [provider]  fluticasone (FLONASE) 50 MCG/ACT nasal spray Place 2 sprays into both nostrils daily. 06/04/18   Wurst, [place], PA-C  hydrALAZINE (APRESOLINE) 50 MG tablet Take 50 mg by mouth 2 (two) times daily.     [provider]  latanoprost (XALATAN) 0.005 % ophthalmic solution Place 1 drop into both eyes at bedtime.     [provider]  levofloxacin (LEVAQUIN) 750 MG tablet Take 1 tablet (750 mg total) by mouth daily. 11/03/18   Tasia Catchings, Trenisha Lafavor V, PA-C  meclizine (ANTIVERT) 25 MG tablet Take 25 mg by mouth 2 (two) times daily as needed for dizziness.     [provider]  MEMANTINE HCL PO Take 20 mg by mouth at bedtime.     [provider]  oxyCODONE-acetaminophen (PERCOCET) 5-325 MG tablet Take 1 tablet by mouth every 4 (four) hours as needed for moderate pain. Patient not taking: Reported on 08/02/3555 08/02/18   Delora Fuel, MD  tadalafil (CIALIS) 20 MG tablet Take 1 tablet (20 mg total) by mouth daily as needed for erectile dysfunction. Patient not taking: Reported on 08/06/2018 05/06/13   Noralee Space, MD  tamsulosin (FLOMAX) 0.4 MG CAPS capsule Take 0.4 mg by mouth at bedtime.     [provider]  traMADol (ULTRAM) 50 MG tablet Take 1 tablet (50 mg total) by mouth every 6 (six) hours as needed. Patient taking differently: Take 50 mg by mouth every 6 (six) hours as needed.  08/11/16  McKenzie, Candee Furbish, MD    Family History Family History  Problem Relation Age of Onset  . Rheum arthritis Mother   . Diabetes Mother   . Stroke Mother   . Heart attack Mother   . Kidney failure Mother   . Heart attack Father   . Heart disease Maternal Grandmother   . Rheum arthritis Maternal Grandmother   . Diabetes Maternal Grandmother   . Stroke Maternal Grandmother   . Colon cancer Neg Hx     Social History Social History   Tobacco Use  . Smoking status: Former Smoker    Packs/day: 1.00    Years: 5.00    Pack years: 5.00    Types: Cigarettes    Last attempt to quit: 11/30/1981    Years since quitting: 36.9  . Smokeless tobacco: Never Used  Substance Use Topics  . Alcohol use: No    Alcohol/week: 0.0 standard drinks    Comment: hx abuse -- quit:  1963  . Drug use: No    Comment: hx abuse -- quit 1964 (iv heroin and cocaine     Allergies   Penicillins   Review of Systems Review of Systems  Reason unable to perform ROS: See HPI as above.     Physical Exam Triage Vital Signs ED Triage Vitals [11/03/18 1241]  Enc Vitals Group     BP 131/80     Pulse Rate 73     Resp 20     Temp 97.8 F (36.6 C)      Temp Source Oral     SpO2 93 %     Weight      Height      Head Circumference      Peak Flow      Pain Score 0     Pain Loc      Pain Edu?      Excl. in [place]?    Orthostatic VS for the past 24 hrs:  BP- Lying Pulse- Lying BP- Sitting Pulse- Sitting BP- Standing at 0 minutes Pulse- Standing at 0 minutes  11/03/18 1320 132/79 62 116/77 69 115/79 76    Updated Vital Signs BP 131/80 (BP Location: Left Arm)   Pulse 73   Temp 97.8 F (36.6 C) (Oral)   Resp 20   SpO2 93%   Physical Exam Constitutional:      General: He is not in acute distress.    Appearance: Normal appearance. He is not ill-appearing, toxic-appearing or diaphoretic.  HENT:     Head: Normocephalic and atraumatic.     Mouth/Throat:     Mouth: Mucous membranes are moist.     Pharynx: Oropharynx is clear. Uvula midline.  Eyes:     Extraocular Movements: Extraocular movements intact.     Conjunctiva/sclera: Conjunctivae normal.     Pupils: Pupils are equal, round, and reactive to light.  Neck:     Musculoskeletal: Normal range of motion and neck supple.  Cardiovascular:     Rate and Rhythm: Normal rate and regular rhythm.     Heart sounds: Normal heart sounds. No murmur. No friction rub. No gallop.   Pulmonary:     Effort: Pulmonary effort is normal. No accessory muscle usage, prolonged expiration, respiratory distress or retractions.     Comments: Lungs clear to auscultation without adventitious lung sounds. Neurological:     General: No focal deficit present.     Mental Status: He is alert and oriented to person, place, and time.  GCS: GCS eye subscore is 4. GCS verbal subscore is 5. GCS motor subscore is 6.     Cranial Nerves: Cranial nerves are intact.     Sensory: Sensation is intact.     Motor: Motor function is intact.     Comments: Abnormal gait, walks with walker/cane, at baseline. Negative romberg, finger to nose.         UC Treatments / Results  Labs (all labs ordered are listed, but  only abnormal results are displayed) Labs Reviewed - No data to display  EKG None  Radiology Dg Chest 2 View  Result Date: 11/03/2018 CLINICAL DATA:  Occasional cough.  Low oxygen saturation. EXAM: CHEST - 2 VIEW COMPARISON:  10/14/2018 FINDINGS: Chronic elevation of the right hemidiaphragm with chronic volume loss at the right base. Question mild patchy infiltrate at the left lung base. This is difficult to establish with certainty on the lateral view because of the elevated hemidiaphragm. Upper lungs are clear. No effusions. No acute bone finding. IMPRESSION: Chronic elevation of the right hemidiaphragm with chronic volume loss at the right lung base. Question mild patchy pneumonia at the left lung base. Electronically Signed   By: Nelson Chimes M.D.   On: 11/03/2018 13:26    Procedures Procedures (including critical care time)  Medications Ordered in UC Medications - No data to display  Initial Impression / Assessment and Plan / UC Course  I have reviewed the triage vital signs and the nursing notes.  Pertinent labs & imaging results that were available during my care of the patient were reviewed by me and considered in my medical decision making (see chart for details).    Patient O2 sat ranging 91-94% on room air. Denies shortness of breath. Able to speak in full sentences without difficulty. No signs of respiratory distress. LCTAB.   EKG NSR with 1st degree AV block, no acute changes from prior EKG. Xray shows possible pneumonia, will start Levaquin as directed. Will order outpatient COVID testing. Patient and spouse to monitor closely. Strict return precautions given. Patient and spouse expresses understanding and agrees to plan.  Case discussed with Dr Mannie Stabile, who also reviewed EKG and agrees to plan.  Final Clinical Impressions(s) / UC Diagnoses   Final diagnoses:  Pneumonia of left lower lobe due to infectious organism [facility])   ED Prescriptions    Medication Sig Dispense  Auth. Provider   levofloxacin (LEVAQUIN) 750 MG tablet Take 1 tablet (750 mg total) by mouth daily. 5 tablet Tobin Chad, PA-C 11/03/18 [place], Eve Rey V, PA-C 11/03/18 1553

## 2018-11-03 NOTE — Discharge Instructions (Signed)
As discussed, possible pneumonia on CXR, will start levaquin at this time. Will do outpatient COVID testing. Continue to monitor symptoms, if worsening with chest pain, shortness of breath, worsening weakness/dizziness, passing out, confusion, go to the ED for further evaluation needed.

## 2018-11-04 ENCOUNTER — Other Ambulatory Visit: Payer: Medicare PPO

## 2018-11-04 DIAGNOSIS — R6889 Other general symptoms and signs: Secondary | ICD-10-CM | POA: Diagnosis not present

## 2018-11-04 DIAGNOSIS — Z20822 Contact with and (suspected) exposure to covid-19: Secondary | ICD-10-CM

## 2018-11-06 LAB — NOVEL CORONAVIRUS, NAA: SARS-CoV-2, NAA: NOT DETECTED

## 2018-11-10 ENCOUNTER — Telehealth: Payer: Self-pay | Admitting: Internal Medicine

## 2018-11-10 ENCOUNTER — Telehealth: Payer: Self-pay | Admitting: *Deleted

## 2018-11-10 NOTE — Telephone Encounter (Signed)
    COVID-19 Pre-Screening Questions:  . In the past 7 to 10 days have you had a cough,  shortness of breath, headache, congestion, fever (100 or greater) body aches, chills, sore throat, or sudden loss of taste or sense of smell? . Have you been around anyone with known Covid 19. . Have you been around anyone who is awaiting Covid 19 test results in the past 7 to 10 days? . Have you been around anyone who has been exposed to Covid 19, or has mentioned symptoms of Covid 19 within the past 7 to 10 days?  If you have any concerns/questions about symptoms patients report during screening (either on the phone or at threshold). Contact the provider seeing the patient or DOD for further guidance.  If neither are available contact a member of the leadership team.           Contacted patient via phone call. No to all Covid 19 questions . Has a mask for  Lab visit.kb 

## 2018-11-10 NOTE — Telephone Encounter (Signed)
New Message ° ° ° °Pt is returning call  ° ° ° °Please call back  °

## 2018-11-12 ENCOUNTER — Other Ambulatory Visit: Payer: Self-pay

## 2018-11-12 ENCOUNTER — Other Ambulatory Visit (HOSPITAL_COMMUNITY)
Admission: RE | Admit: 2018-11-12 | Discharge: 2018-11-12 | Disposition: A | Discharge status: Home / Self Care | Payer: Medicare PPO | Source: Ambulatory Visit | Attending: Internal Medicine | Admitting: Internal Medicine

## 2018-11-12 ENCOUNTER — Other Ambulatory Visit: Payer: Medicare PPO | Admitting: *Deleted

## 2018-11-12 DIAGNOSIS — Z1159 Encounter for screening for other viral diseases: Secondary | ICD-10-CM | POA: Insufficient documentation

## 2018-11-12 DIAGNOSIS — Z01812 Encounter for preprocedural laboratory examination: Secondary | ICD-10-CM | POA: Insufficient documentation

## 2018-11-12 DIAGNOSIS — I471 Supraventricular tachycardia: Secondary | ICD-10-CM | POA: Diagnosis not present

## 2018-11-12 LAB — CBC WITH DIFFERENTIAL/PLATELET
Basophils Absolute: 0 10*3/uL (ref 0.0–0.2)
Basos: 1 %
EOS (ABSOLUTE): 0.1 10*3/uL (ref 0.0–0.4)
Eos: 3 %
Hematocrit: 52 % — ABNORMAL HIGH (ref 37.5–51.0)
Hemoglobin: 17.2 g/dL (ref 13.0–17.7)
Immature Grans (Abs): 0 10*3/uL (ref 0.0–0.1)
Immature Granulocytes: 0 %
Lymphocytes Absolute: 1.3 10*3/uL (ref 0.7–3.1)
Lymphs: 26 %
MCH: 30.3 pg (ref 26.6–33.0)
MCHC: 33.1 g/dL (ref 31.5–35.7)
MCV: 92 fL (ref 79–97)
Monocytes Absolute: 0.3 10*3/uL (ref 0.1–0.9)
Monocytes: 6 %
Neutrophils Absolute: 3.1 10*3/uL (ref 1.4–7.0)
Neutrophils: 64 %
Platelets: 144 10*3/uL — ABNORMAL LOW (ref 150–450)
RBC: 5.67 x10E6/uL (ref 4.14–5.80)
RDW: 13.7 % (ref 11.6–15.4)
WBC: 4.8 10*3/uL (ref 3.4–10.8)

## 2018-11-12 LAB — BASIC METABOLIC PANEL
BUN/Creatinine Ratio: 19 (ref 10–24)
BUN: 15 mg/dL (ref 8–27)
CO2: 24 mmol/L (ref 20–29)
Calcium: 9.9 mg/dL (ref 8.6–10.2)
Chloride: 105 mmol/L (ref 96–106)
Creatinine, Ser: 0.78 mg/dL (ref 0.76–1.27)
GFR calc Af Amer: 100 mL/min/{1.73_m2} (ref >59)
GFR calc non Af Amer: 86 mL/min/{1.73_m2} (ref >59)
Glucose: 135 mg/dL — ABNORMAL HIGH (ref 65–99)
Potassium: 3.7 mmol/L (ref 3.5–5.2)
Sodium: 143 mmol/L (ref 134–144)

## 2018-11-13 LAB — NOVEL CORONAVIRUS, NAA (HOSP ORDER, SEND-OUT TO REF LAB; TAT 18-24 HRS): SARS-CoV-2, NAA: NOT DETECTED

## 2018-11-15 ENCOUNTER — Other Ambulatory Visit: Payer: Self-pay

## 2018-11-15 ENCOUNTER — Encounter (HOSPITAL_COMMUNITY)
Admission: RE | Disposition: A | Discharge status: Home / Self Care | Payer: Self-pay | Source: Home / Self Care | Attending: Internal Medicine

## 2018-11-15 ENCOUNTER — Ambulatory Visit (HOSPITAL_COMMUNITY)
Admission: RE | Admit: 2018-11-15 | Discharge: 2018-11-15 | Disposition: A | Discharge status: Home / Self Care | Payer: Medicare PPO | Attending: Internal Medicine | Admitting: Internal Medicine

## 2018-11-15 DIAGNOSIS — Z79899 Other long term (current) drug therapy: Secondary | ICD-10-CM | POA: Diagnosis not present

## 2018-11-15 DIAGNOSIS — H409 Unspecified glaucoma: Secondary | ICD-10-CM | POA: Insufficient documentation

## 2018-11-15 DIAGNOSIS — Z880 Allergy status to penicillin: Secondary | ICD-10-CM | POA: Insufficient documentation

## 2018-11-15 DIAGNOSIS — R972 Elevated prostate specific antigen [PSA]: Secondary | ICD-10-CM | POA: Insufficient documentation

## 2018-11-15 DIAGNOSIS — N400 Enlarged prostate without lower urinary tract symptoms: Secondary | ICD-10-CM | POA: Diagnosis not present

## 2018-11-15 DIAGNOSIS — Z8673 Personal history of transient ischemic attack (TIA), and cerebral infarction without residual deficits: Secondary | ICD-10-CM | POA: Diagnosis not present

## 2018-11-15 DIAGNOSIS — Z87891 Personal history of nicotine dependence: Secondary | ICD-10-CM | POA: Insufficient documentation

## 2018-11-15 DIAGNOSIS — I10 Essential (primary) hypertension: Secondary | ICD-10-CM | POA: Diagnosis not present

## 2018-11-15 DIAGNOSIS — I471 Supraventricular tachycardia, unspecified: Secondary | ICD-10-CM | POA: Diagnosis present

## 2018-11-15 DIAGNOSIS — Z7951 Long term (current) use of inhaled steroids: Secondary | ICD-10-CM | POA: Diagnosis not present

## 2018-11-15 HISTORY — PX: SVT ABLATION: EP1225

## 2018-11-15 SURGERY — SVT ABLATION

## 2018-11-15 MED ORDER — ACETAMINOPHEN 325 MG PO TABS
650.0000 mg | ORAL_TABLET | ORAL | Status: DC | PRN
  Administered 2018-11-15: 650 mg via ORAL
  Filled 2018-11-15 (×3): qty 2

## 2018-11-15 MED ORDER — ISOPROTERENOL HCL 0.2 MG/ML IJ SOLN
INTRAVENOUS | Status: DC | PRN
  Administered 2018-11-15: 2 ug/min via INTRAVENOUS

## 2018-11-15 MED ORDER — SODIUM CHLORIDE 0.9% FLUSH: 3.0000 mL | Freq: Two times a day (BID) | INTRAVENOUS | Status: DC

## 2018-11-15 MED ORDER — ONDANSETRON HCL 4 MG/2ML IJ SOLN: 4.0000 mg | Freq: Four times a day (QID) | INTRAMUSCULAR | Status: DC | PRN

## 2018-11-15 MED ORDER — SODIUM CHLORIDE 0.9% FLUSH: 3.0000 mL | INTRAVENOUS | Status: DC | PRN

## 2018-11-15 MED ORDER — SODIUM CHLORIDE 0.9 % IV SOLN: 250.0000 mL | INTRAVENOUS | Status: DC | PRN

## 2018-11-15 MED ORDER — MIDAZOLAM HCL 5 MG/5ML IJ SOLN
INTRAMUSCULAR | Status: DC | PRN
  Administered 2018-11-15 (×5): 1 mg via INTRAVENOUS

## 2018-11-15 MED ORDER — BUPIVACAINE HCL (PF) 0.25 % IJ SOLN
INTRAMUSCULAR | Status: DC | PRN
  Administered 2018-11-15: 60 mL

## 2018-11-15 MED ORDER — CARVEDILOL 25 MG PO TABS
25.0000 mg | ORAL_TABLET | Freq: Once | ORAL | Status: AC
  Administered 2018-11-15: 25 mg via ORAL
  Filled 2018-11-15 (×2): qty 1

## 2018-11-15 MED ORDER — BUPIVACAINE HCL (PF) 0.25 % IJ SOLN
INTRAMUSCULAR | Status: AC
  Filled 2018-11-15: qty 60

## 2018-11-15 MED ORDER — AMLODIPINE BESYLATE 5 MG PO TABS
5.0000 mg | ORAL_TABLET | Freq: Once | ORAL | Status: AC
  Administered 2018-11-15: 5 mg via ORAL
  Filled 2018-11-15 (×2): qty 1

## 2018-11-15 MED ORDER — ISOPROTERENOL HCL 0.2 MG/ML IJ SOLN
INTRAMUSCULAR | Status: AC
  Filled 2018-11-15: qty 5

## 2018-11-15 MED ORDER — HEPARIN (PORCINE) IN NACL 1000-0.9 UT/500ML-% IV SOLN
INTRAVENOUS | Status: AC
  Filled 2018-11-15: qty 500

## 2018-11-15 MED ORDER — MIDAZOLAM HCL 5 MG/5ML IJ SOLN
INTRAMUSCULAR | Status: AC
  Filled 2018-11-15: qty 5

## 2018-11-15 MED ORDER — FENTANYL CITRATE (PF) 100 MCG/2ML IJ SOLN
INTRAMUSCULAR | Status: AC
  Filled 2018-11-15: qty 2

## 2018-11-15 MED ORDER — HEPARIN (PORCINE) IN NACL 1000-0.9 UT/500ML-% IV SOLN
INTRAVENOUS | Status: DC | PRN
  Administered 2018-11-15: 500 mL

## 2018-11-15 MED ORDER — SODIUM CHLORIDE 0.9 % IV SOLN
INTRAVENOUS | Status: DC
  Administered 2018-11-15: 06:00:00 via INTRAVENOUS

## 2018-11-15 MED ORDER — FENTANYL CITRATE (PF) 100 MCG/2ML IJ SOLN
INTRAMUSCULAR | Status: DC | PRN
  Administered 2018-11-15 (×3): 12.5 ug via INTRAVENOUS
  Administered 2018-11-15: 25 ug via INTRAVENOUS

## 2018-11-15 SURGICAL SUPPLY — 12 items
BAG SNAP BAND KOVER 36X36 (MISCELLANEOUS) ×2 IMPLANT
CATH EZ STEER NAV 4MM D-F CUR (ABLATOR) ×2 IMPLANT
CATH HEX JOSEPH 2-5-2 65CM 6F (CATHETERS) ×1 IMPLANT
CATH JOSEPH QUAD ALLRED 6F REP (CATHETERS) ×2 IMPLANT
PACK EP LATEX FREE (CUSTOM PROCEDURE TRAY) ×2
PACK EP LF (CUSTOM PROCEDURE TRAY) ×1 IMPLANT
PAD PRO RADIOLUCENT 2001M-C (PAD) ×2 IMPLANT
PATCH CARTO3 (PAD) ×1 IMPLANT
SHEATH PINNACLE 6F 10CM (SHEATH) ×2 IMPLANT
SHEATH PINNACLE 7F 10CM (SHEATH) ×1 IMPLANT
SHEATH PINNACLE 8F 10CM (SHEATH) ×1 IMPLANT
SHIELD RADPAD SCOOP 12X17 (MISCELLANEOUS) ×1 IMPLANT

## 2018-11-15 NOTE — Progress Notes (Signed)
Site area: rt groin fv sheaths x3 Site Prior to Removal:  Level 0 Pressure Applied For:  15 minutes Manual:   yes Patient Status During Pull:  stable Post Pull Site:  Level  0 Post Pull Instructions Given:  yes Post Pull Pulses Present:  Rt dp palpable Dressing Applied:  Gauze and tegaderm Bedrest begins @ 1010 Comments:  IV saline locked

## 2018-11-15 NOTE — Discharge Instructions (Signed)
Post procedure care instructions No driving for 4 days. No lifting over 5 lbs for 1 week. No vigorous or sexual activity for 1 week. You may return to work on 11/22/2018. Keep procedure site clean & dry. If you notice increased pain, swelling, bleeding or pus, call/return!  You may shower, but no soaking baths/hot tubs/pools for 1 week.       Hold pressure on your (groin) site when you cough or sneeze next 24 houra  Cardiac Ablation, Care After This sheet gives you information about how to care for yourself after your procedure. Your health care provider may also give you more specific instructions. If you have problems or questions, contact your health care provider. What can I expect after the procedure? After the procedure, it is common to have:  Bruising around your puncture site.  Tenderness around your puncture site.  Skipped heartbeats.  Tiredness (fatigue). Follow these instructions at home: Puncture site care   Follow instructions from your health care provider about how to take care of your puncture site. Make sure you: ? Wash your hands with soap and water before you change your bandage (dressing). If soap and water are not available, use hand sanitizer. ? Remove your dressing as told by your health care provider. In 24-48 hours  Check your puncture site every day for signs of infection. Check for: ? Redness, swelling, or pain. ? Fluid or blood. If your puncture site starts to bleed, lie down on your back, apply firm pressure to the area, and contact your health care provider. ? Warmth. ? Pus or a bad smell. Driving  Ask your health care provider when it is safe for you to drive again after the procedure.  Do not drive or use heavy machinery while taking prescription pain medicine.  Do not drive for 24 hours if you were given a medicine to help you relax (sedative) during your procedure. Activity  Avoid activities that take a lot of effort for at least 3 days  after your procedure.  Do not lift anything that is heavier than 5 lb, or the limit that you are told, until your health care provider says that it is safe. For about 1 week  Return to your normal activities as told by your health care provider. Ask your health care provider what activities are safe for you. General instructions  Take over-the-counter and prescription medicines only as told by your health care provider.  Do not use any products that contain nicotine or tobacco, such as cigarettes and e-cigarettes. If you need help quitting, ask your health care provider.  Do not take baths, swim, or use a hot tub until your health care provider approves. You may shower once you remove the dressings.  Do not put any creams, ointments or lotions on sites once you remove your dressing. Keep areas clean and dry.  Do not drink alcohol for 24 hours after your procedure.  Keep all follow-up visits as told by your health care provider. This is important. Contact a health care provider if:  You have redness, mild swelling, or pain around your puncture site.  You have fluid or blood coming from your puncture site that stops after applying firm pressure to the area.  Your puncture site feels warm to the touch.  You have pus or a bad smell coming from your puncture site.  You have a fever.  You have chest pain or discomfort that spreads to your neck, jaw, or arm.  You are  sweating a lot.  You feel nauseous.  You have a fast or irregular heartbeat.  You have shortness of breath.  You are dizzy or light-headed and feel the need to lie down.  You have pain or numbness in the arm or leg closest to your puncture site. Get help right away if:  Your puncture site suddenly swells.  Your puncture site is bleeding and the bleeding does not stop after applying firm pressure to the area. These symptoms may represent a serious problem that is an emergency. Do not wait to see if the symptoms  will go away. Get medical help right away. Call your local emergency services (911 in the [place] Do not drive yourself to the hospital. Summary  After the procedure, it is normal to have bruising and tenderness at the puncture site in your groin, neck, or forearm.  Check your puncture site every day for signs of infection.  Get help right away if your puncture site is bleeding and the bleeding does not stop after applying firm pressure to the area. This is a medical emergency. This information is not intended to replace advice given to you by your health care provider. Make sure you discuss any questions you have with your health care provider. Document Released: 08/28/2016 Document Revised: 08/28/2016 Document Reviewed: 08/28/2016 Elsevier Interactive Patient Education  2019 [employer].

## 2018-11-15 NOTE — Interval H&P Note (Signed)
History and Physical Interval Note:  11/15/2018 8:02 AM  Dustin Hamilton  has presented today for surgery, with the diagnosis of svt.  The various methods of treatment have been discussed with the patient and family. After consideration of risks, benefits and other options for treatment, the patient has consented to  Procedure(s): SVT ABLATION (N/A) as a surgical intervention.  The patient's history has been reviewed, patient examined, no change in status, stable for surgery.  I have reviewed the patient's chart and labs.  Questions were answered to the patient's satisfaction.     Cristopher Peru

## 2018-11-15 NOTE — Progress Notes (Signed)
Up and walked and tolerated well; right groin stable, no bleeding or hematoma 

## 2018-11-15 NOTE — Progress Notes (Signed)
Site area: rt ij venous sheath Site Prior to Removal:  Level 0 Pressure Applied For: 10 minutes Manual:   yes Patient Status During Pull:  stable Post Pull Site:  Level  0 Post Pull Instructions Given:  yes Post Pull Pulses Present: NA Dressing Applied:  Gauze and tegaderm Bedrest begins @  Comments:

## 2018-11-16 ENCOUNTER — Telehealth: Payer: Self-pay

## 2018-11-16 ENCOUNTER — Encounter (HOSPITAL_COMMUNITY): Payer: Self-pay | Admitting: Internal Medicine

## 2018-11-16 ENCOUNTER — Other Ambulatory Visit: Payer: Self-pay

## 2018-11-16 ENCOUNTER — Emergency Department (HOSPITAL_COMMUNITY)
Admission: EM | Admit: 2018-11-16 | Discharge: 2018-11-16 | Disposition: A | Discharge status: Home / Self Care | Payer: No Typology Code available for payment source | Attending: Emergency Medicine | Admitting: Emergency Medicine

## 2018-11-16 ENCOUNTER — Telehealth: Payer: Self-pay | Admitting: Internal Medicine

## 2018-11-16 DIAGNOSIS — Z79899 Other long term (current) drug therapy: Secondary | ICD-10-CM | POA: Insufficient documentation

## 2018-11-16 DIAGNOSIS — I10 Essential (primary) hypertension: Secondary | ICD-10-CM | POA: Diagnosis not present

## 2018-11-16 DIAGNOSIS — Z7982 Long term (current) use of aspirin: Secondary | ICD-10-CM | POA: Diagnosis not present

## 2018-11-16 DIAGNOSIS — L7622 Postprocedural hemorrhage and hematoma of skin and subcutaneous tissue following other procedure: Secondary | ICD-10-CM | POA: Insufficient documentation

## 2018-11-16 DIAGNOSIS — S31010A Laceration without foreign body of lower back and pelvis without penetration into retroperitoneum, initial encounter: Secondary | ICD-10-CM | POA: Diagnosis not present

## 2018-11-16 DIAGNOSIS — R58 Hemorrhage, not elsewhere classified: Secondary | ICD-10-CM

## 2018-11-16 DIAGNOSIS — Z87891 Personal history of nicotine dependence: Secondary | ICD-10-CM | POA: Insufficient documentation

## 2018-11-16 MED ORDER — LIDOCAINE HCL (PF) 1 % IJ SOLN
5.0000 mL | Freq: Once | INTRAMUSCULAR | Status: AC
  Administered 2018-11-16: 5 mL
  Filled 2018-11-16: qty 5

## 2018-11-16 NOTE — Telephone Encounter (Signed)
  Wife is calling because Mr Duerst had an ablation yesterday and the bleeding in his groin would not stop. They ended up going to the ER and having 4 stitches put in and the doctor told them to call and let Dr Lovena Le know

## 2018-11-16 NOTE — ED Triage Notes (Signed)
Pt arrived to ER with c/o continuous bleeding from post op site where he had cardiac cath yesterday.

## 2018-11-16 NOTE — Telephone Encounter (Signed)

## 2018-11-16 NOTE — ED Provider Notes (Signed)
[facility] EMERGENCY DEPARTMENT Provider Note   CSN: [id number] Arrival date & time: 11/16/18  0846    History   Chief Complaint Chief Complaint  Patient presents with  . Bleeding/Bruising    Post Op Cath Site    HPI Dustin Hamilton is a 78 y.o. male presenting for evaluation of R groin bleeding.  Pt states he started to have bleeding from the R groin cath site last night. He was asleep and the pt's wife noticed it. He denies pain at the area. The cath was performed yesterday with Dr. Lovena Le. There was no bleeding prior to leaving the hospital. He denies dizziness, weakness, or lightheadedness. He is normally on plavix, but stopped it 3 days ago for the procedure, and did not resume it this am. He did not call his cardiologist today, but came to the ed for the bleeding. He denies fevers, chills, cp, sob, n/v, abd pain.      HPI  Past Medical History:  Diagnosis Date  . BPH (benign prostatic hyperplasia)   . Cerebrovascular disease   . Congenital anomaly of diaphragm   . Elevated PSA   . Glaucoma, both eyes   . Hemorrhoid   . Hepatitis B surface antigen positive    02-20-2011  . History of adenomatous polyp of colon    2007, 2009 and 2013  tubular adenoma's  . History of alcohol abuse    quit 1963  . History of cerebral parenchymal hemorrhage    01/ 2006  left occiptial lobe related to hypertensive crisis  . History of CVA (cerebrovascular accident)    09-12-2012  left hippocampus/ amygdala junction and per MRI old white matter infarcts--  per pt residual short- term memory issues  . History of fatty infiltration of liver hx visit's at [facility] , last visit 05/ 2014   elvated LFT's ,  via liver bx 2004 related to hx alcohol and drug abuse (quit 1964)  . 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 HTN (hypertension)   . Renal cyst, left   . Stroke ([place])    hx of 3 strokes in past   . 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 Urethral lesion    urethral mass    Patient Active Problem List   Diagnosis Date Noted  . PSVT (paroxysmal supraventricular tachycardia) ([place]) 06/03/2017  . Dizziness 08/02/2014  . 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 CVA (cerebral infarction) 09/13/2012  . Vertigo 09/12/2012  . EAR PAIN 09/05/2009  . BACK PAIN 06/20/2009  . COLONIC POLYPS 05/06/2007  . 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 NONDEPENDENT ALCOHOL ABUSE IN REMISSION 05/06/2007  . HYPERTENSION, SEVERE 05/06/2007  . Hypertensive heart disease without heart failure 05/06/2007  . INTRACRANIAL HEMORRHAGE 05/06/2007  . Cerebrovascular disease 05/06/2007  . HEMORRHOIDS 05/06/2007  . INGUINAL HERNIA, RIGHT 05/06/2007  . RENAL CYST 05/06/2007  . HEMATURIA UNSPECIFIED 05/06/2007  . 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 PSA, INCREASED 05/06/2007  . LIVER FUNCTION TESTS, ABNORMAL 05/06/2007  . BPH (benign prostatic hypertrophy) with urinary obstruction 05/06/2007  . PERS HX NONCOMPLIANCE W/MED TX PRS HAZARDS HLTH 05/06/2007    Past Surgical History:  Procedure Laterality Date  . CARDIOVASCULAR STRESS TEST  05/05/2007   normal nuclear study w/ no ischemia/  normal LV fucntion and wall motion , ef60%  . COLONOSCOPY  last one 04-06-2012  . CYSTO/  LEFT  RETROGRADE PYELOGRAM/ CYTOLOGY WASHINGS/  URETEROSCOPY  03/05/2000  . INGUINAL HERNIA REPAIR Bilateral 1965 and 1980's  . LAPAROSCOPIC INGUINAL HERNIA WITH UMBILICAL HERNIA Right 16/11/3708  . LIVER BIOPSY  1980's and 2004  . SVT ABLATION N/A 11/15/2018   Procedure: SVT ABLATION;  Surgeon: Evans Lance, MD;  Location: [facility];  Service: Cardiovascular;  Laterality: N/A;  . TRANSTHORACIC ECHOCARDIOGRAM  09/13/2012   moderate LVH,  ef 60-65%/    . TRANSURETHRAL RESECTION OF BLADDER TUMOR N/A 08/11/2016   Procedure: TRANSURETHRAL RESECTION OF BLADDER TUMOR (TURBT);  Surgeon: Cleon Gustin,  MD;  Location: [facility];  Service: Urology;  Laterality: N/A;        Home Medications    Prior to Admission medications   Medication Sig Start Date End Date Taking? Authorizing Provider  amLODipine (NORVASC) 5 MG tablet Take 5 mg by mouth daily.    [provider]  aspirin EC 81 MG tablet Take 81 mg by mouth daily.    [provider]  benzonatate (TESSALON) 100 MG capsule Take 1 capsule (100 mg total) by mouth every 8 (eight) hours. Patient not taking: Reported on 11/09/2018 06/04/18   Wurst, [place], PA-C  brimonidine (ALPHAGAN) 0.2 % ophthalmic solution Place 1 drop into both eyes 2 (two) times daily.    [provider]  carvedilol (COREG) 25 MG tablet Take 25 mg by mouth 2 (two) times a day.     [provider]  cetirizine (ZYRTEC) 5 MG chewable tablet Chew 1 tablet (5 mg total) by mouth daily. Patient not taking: Reported on 11/09/2018 06/04/18   Stacey Drain [place], PA-C  clopidogrel (PLAVIX) 75 MG tablet Take 1 tablet (75 mg total) by mouth daily with breakfast. 05/06/13   Noralee Space, MD  finasteride (PROSCAR) 5 MG tablet Take 5 mg by mouth daily.     [provider]  fluticasone (FLONASE) 50 MCG/ACT nasal spray Place 2 sprays into both nostrils daily. Patient not taking: Reported on 11/09/2018 06/04/18   Wurst, [place], PA-C  latanoprost (XALATAN) 0.005 % ophthalmic solution Place 1 drop into both eyes at bedtime.     [provider]  levofloxacin (LEVAQUIN) 750 MG tablet Take 1 tablet (750 mg total) by mouth daily. Patient not taking: Reported on 11/09/2018 11/03/18   Ok Edwards, PA-C  tamsulosin (FLOMAX) 0.4 MG CAPS capsule Take 0.4 mg by mouth at bedtime.     [provider]    Family History Family History  Problem Relation Age of Onset  . Rheum arthritis Mother   . Diabetes Mother   . Stroke Mother   . Heart attack Mother   . Kidney failure Mother   . Heart attack Father   . Heart disease Maternal  Grandmother   . Rheum arthritis Maternal Grandmother   . Diabetes Maternal Grandmother   . Stroke Maternal Grandmother   . Colon cancer Neg Hx     Social History Social History   Tobacco Use  . Smoking status: Former Smoker    Packs/day: 1.00    Years: 5.00    Pack years: 5.00    Types: Cigarettes    Quit date: 11/30/1981    Years since quitting: 36.9  . Smokeless tobacco: Never Used  Substance Use Topics  . Alcohol use: No    Alcohol/week: 0.0 standard drinks    Comment: hx abuse -- quit:  1963  . Drug use: No    Comment: hx abuse -- quit  1964 (iv heroin and cocaine     Allergies   Penicillins   Review of Systems Review of Systems  Skin: Positive for wound.  All other systems reviewed and are negative.    Physical Exam Updated Vital Signs BP 114/82   Pulse 66   Resp 18   SpO2 95%   Physical Exam Vitals signs and nursing note reviewed.  Constitutional:      General: He is not in acute distress.    Appearance: He is well-developed.     Comments: Appears nontoxic  HENT:     Head: Normocephalic and atraumatic.  Neck:     Musculoskeletal: Normal range of motion.  Cardiovascular:     Rate and Rhythm: Normal rate and regular rhythm.     Pulses: Normal pulses.  Pulmonary:     Effort: Pulmonary effort is normal.  Abdominal:     General: There is no distension.     Palpations: There is no mass.     Tenderness: There is no abdominal tenderness. There is no guarding or rebound.  Musculoskeletal: Normal range of motion.  Skin:    General: Skin is warm.     Capillary Refill: Capillary refill takes less than 2 seconds.     Findings: No rash.     Comments: Slow ooze from R groin cath site. No obvious hematoma underlying bleeding. No ttp. No surrounding erythema, induration, or signs of infection.   Neurological:     Mental Status: He is alert and oriented to person, place, and time.      ED Treatments / Results  Labs (all labs ordered are listed, but only  abnormal results are displayed) Labs Reviewed - No data to display  EKG None  Radiology No results found.  Procedures .Marland KitchenLaceration Repair  Date/Time: 11/16/2018 10:00 AM Performed by: Franchot Heidelberg, PA-C Authorized by: Franchot Heidelberg, PA-C   Consent:    Consent obtained:  Verbal   Consent given by:  Patient   Risks discussed:  Infection, need for additional repair, pain, poor cosmetic result and poor wound healing Anesthesia (see MAR for exact dosages):    Anesthesia method:  Local infiltration   Local anesthetic:  Lidocaine 1% w/o epi Laceration details:    Location:  Pelvis   Pelvis location:  Groin   Length (cm):  0.2 Repair type:    Repair type:  Simple Pre-procedure details:    Preparation:  Patient was prepped and draped in usual sterile fashion Treatment:    Area cleansed with:  Hibiclens   Amount of cleaning:  Standard Skin repair:    Repair method:  Sutures   Suture size:  5-0   Wound skin closure material used: vicryl rapide.   Suture technique:  Simple interrupted   Number of sutures:  2 Approximation:    Approximation:  Close Post-procedure details:    Dressing:  Sterile dressing   Patient tolerance of procedure:  Tolerated well, no immediate complications   (including critical care time)  Medications Ordered in ED Medications  lidocaine (PF) (XYLOCAINE) 1 % injection 5 mL (has no administration in time range)     Initial Impression / Assessment and Plan / ED Course  I have reviewed the triage vital signs and the nursing notes.  Pertinent labs & imaging results that were available during my care of the patient were reviewed by me and considered in my medical decision making (see chart for details).         Pt presenting for evaluation  of bleeding from cath site. physical exam shows pt who appears nontoxic. Slow ooze from cath site without pain or signs of infection. He is normally on blood thinners, has held them recently for the  procedure.  Discussed with attending, Dr. Zenia Resides evaluated the patient.  Will place stitch to stop bleeding and have patient follow-up with cardiology.  Procedure performed as described above.  2 stitches were placed, as patient still had bleeding after the first stitch.  After the second stitch, bleeding stopped.  Dressing placed and discussed wound care instructions.  Stressed importance of follow-up with cardiology.  At this time, patient appears safe for discharge.  Return precautions given.  Patient states he understands and agrees to plan.   Final Clinical Impressions(s) / ED Diagnoses   Final diagnoses:  Bleeding    ED Discharge Orders    None       Franchot Heidelberg, PA-C 11/16/18 1002    Lacretia Leigh, MD 11/17/18 [phone]

## 2018-11-16 NOTE — ED Notes (Signed)
Pt discharged from ED; instructions provided; Pt encouraged to return to ED if symptoms worsen and to f/u with PCP; Pt verbalized understanding of all instructions 

## 2018-11-16 NOTE — Telephone Encounter (Signed)
Per Dr. Legrand Rams Pt to be seen next day in office June 23.

## 2018-11-16 NOTE — ED Provider Notes (Signed)
Medical screening examination/treatment/procedure(s) were conducted as a shared visit with non-physician practitioner(s) and myself.  I personally evaluated the patient during the encounter.    78 year old male presents with bleeding from his catheter site.  Has a small pinpoint area of bleeding noted in his right groin.  Stitch will be applied and he will follow with his doctor   Lacretia Leigh, MD 11/16/18 [phone]

## 2018-11-16 NOTE — Discharge Instructions (Addendum)
Call your cardiologist to let him know that you had bleeding from the site a needed stitches.  The stitches are dissolvable, you do not need them taken out.  Wash the area daily with soap and water, but do not scrub veinously.  If bleeding returns hold firm direct pressure for 20 minutes. If it is still bleeding return to the ER. Return to the ER if you develop fevers, chills, pain, redness around the site, or any new, worsening or concerning symptoms.

## 2018-11-23 ENCOUNTER — Other Ambulatory Visit: Payer: Self-pay

## 2018-11-23 ENCOUNTER — Encounter: Payer: Self-pay | Admitting: Internal Medicine

## 2018-11-23 ENCOUNTER — Ambulatory Visit (INDEPENDENT_AMBULATORY_CARE_PROVIDER_SITE_OTHER): Payer: No Typology Code available for payment source | Admitting: Internal Medicine

## 2018-11-23 VITALS — BP 118/86 | HR 88 | Ht 71.0 in | Wt 170.0 lb

## 2018-11-23 DIAGNOSIS — I471 Supraventricular tachycardia: Secondary | ICD-10-CM

## 2018-11-23 NOTE — Progress Notes (Signed)
HPI Dustin Hamilton returns today for followup of SVT s/p catheter ablation complicated by bleeding his his right groin. 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 He underwent successful ablation on 6/15. He developed bleeding and went to the ED on 6/16 and pressure was held and a stitch placed. He has done well since.   Allergies  Allergen Reactions  . Penicillins Hives    Has patient had a PCN reaction causing immediate rash, facial/tongue/throat swelling, SOB or lightheadedness with hypotension: Yes Has patient had a PCN reaction causing severe rash involving mucus membranes or skin necrosis: Yes Has patient had a PCN reaction that required hospitalization No Has patient had a PCN reaction occurring within the last 10 years: No If all of the above answers are "NO", then may proceed with Cephalosporin use.      Current Outpatient Medications  Medication Sig Dispense Refill  . amLODipine (NORVASC) 5 MG tablet Take 5 mg by mouth daily.    Marland Kitchen aspirin EC 81 MG tablet Take 81 mg by mouth daily.    . brimonidine (ALPHAGAN) 0.2 % ophthalmic solution Place 1 drop into both eyes 2 (two) times daily.    . carvedilol (COREG) 25 MG tablet Take 25 mg by mouth 2 (two) times a day.     . cetirizine (ZYRTEC) 5 MG chewable tablet Chew 1 tablet (5 mg total) by mouth daily. 20 tablet 0  . clopidogrel (PLAVIX) 75 MG tablet Take 1 tablet (75 mg total) by mouth daily with breakfast. 30 tablet 11  . finasteride (PROSCAR) 5 MG tablet Take 5 mg by mouth daily.     Marland Kitchen latanoprost (XALATAN) 0.005 % ophthalmic solution Place 1 drop into both eyes at bedtime.     . tamsulosin (FLOMAX) 0.4 MG CAPS capsule Take 0.4 mg by mouth at bedtime.      No current facility-administered medications for this visit.      Past Medical History:  Diagnosis Date  . BPH (benign prostatic hyperplasia)   . Cerebrovascular disease   . Congenital anomaly of diaphragm   . Elevated PSA   . Glaucoma,  both eyes   . Hemorrhoid   . Hepatitis B surface antigen positive    02-20-2011  . History of adenomatous polyp of colon    2007, 2009 and 2013  tubular adenoma's  . History of alcohol abuse    quit 1963  . History of cerebral parenchymal hemorrhage    01/ 2006  left occiptial lobe related to hypertensive crisis  . History of CVA (cerebrovascular accident)    09-12-2012  left hippocampus/ amygdala junction and per MRI old white matter infarcts--  per pt residual short- term memory issues  . History of fatty infiltration of liver hx visit's at [facility] , last visit 05/ 2014   elvated LFT's ,  via liver bx 2004 related to hx alcohol and drug abuse (quit 1964)  . 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 HTN (hypertension)   . Renal cyst, left   . Stroke ([place])    hx of 3 strokes in past   . 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 Urethral lesion    urethral mass    ROS:   All systems reviewed and negative except as noted in the HPI.   Past Surgical History:  Procedure Laterality Date  .  CARDIOVASCULAR STRESS TEST  05/05/2007   normal nuclear study w/ no ischemia/  normal LV fucntion and wall motion , ef60%  . COLONOSCOPY  last one 04-06-2012  . CYSTO/  LEFT RETROGRADE PYELOGRAM/ CYTOLOGY WASHINGS/  URETEROSCOPY  03/05/2000  . INGUINAL HERNIA REPAIR Bilateral 1965 and 1980's  . LAPAROSCOPIC INGUINAL HERNIA WITH UMBILICAL HERNIA Right 95/62/1308  . LIVER BIOPSY  1980's and 2004  . SVT ABLATION N/A 11/15/2018   Procedure: SVT ABLATION;  Surgeon: Evans Lance, MD;  Location: [facility];  Service: Cardiovascular;  Laterality: N/A;  . TRANSTHORACIC ECHOCARDIOGRAM  09/13/2012   moderate LVH,  ef 60-65%/    . TRANSURETHRAL RESECTION OF BLADDER TUMOR N/A 08/11/2016   Procedure: TRANSURETHRAL RESECTION OF BLADDER TUMOR (TURBT);  Surgeon: Cleon Gustin, MD;  Location: [facility];  Service:  Urology;  Laterality: N/A;     Family History  Problem Relation Age of Onset  . Rheum arthritis Mother   . Diabetes Mother   . Stroke Mother   . Heart attack Mother   . Kidney failure Mother   . Heart attack Father   . Heart disease Maternal Grandmother   . Rheum arthritis Maternal Grandmother   . Diabetes Maternal Grandmother   . Stroke Maternal Grandmother   . Colon cancer Neg Hx      Social History   Socioeconomic History  . Marital status: Married    Spouse name: [name]  . Number of children: 1  . Years of education: College  . Highest education level: Not on file  Occupational History  . Occupation: Geophysicist/field seismologist  Social Needs  . Financial resource strain: Not on file  . Food insecurity    Worry: Not on file    Inability: Not on file  . Transportation needs    Medical: Not on file    Non-medical: Not on file  Tobacco Use  . Smoking status: Former Smoker    Packs/day: 1.00    Years: 5.00    Pack years: 5.00    Types: Cigarettes    Quit date: 11/30/1981    Years since quitting: 37.0  . Smokeless tobacco: Never Used  Substance and Sexual Activity  . Alcohol use: No    Alcohol/week: 0.0 standard drinks    Comment: hx abuse -- quit:  1963  . 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 Sexual activity: Not on file  Lifestyle  . Physical activity    Days per week: Not on file    Minutes per session: Not on file  . Stress: Not on file  Relationships  . Social Herbalist on phone: Not on file    Gets together: Not on file    Attends religious service: Not on file    Active member of club or organization: Not on file    Attends meetings of clubs or organizations: Not on file    Relationship status: Not on file  . Intimate partner violence    Fear of current or ex partner: Not on file    Emotionally abused: Not on file    Physically abused: Not on file    Forced sexual activity: Not on file  Other Topics Concern  .  Not on file  Social History Narrative   Patient lives at home with his spouse.   Caffeine Use:  Tea, lots     BP 118/86   Pulse  88   Ht 5\' 11"  (1.803 m)   Wt 170 lb (77.1 kg)   SpO2 98%   BMI 23.71 kg/m   Physical Exam:  Well appearing NAD HEENT: Unremarkable Neck:  No JVD, no thyromegally Lymphatics:  No adenopathy Back:  No CVA tenderness Lungs:  No increased work of breathing. HEART:  Regular rate rhythm, no murmurs, no rubs, no clicks Abd:  soft, positive bowel sounds, no organomegally, no rebound, no guarding Ext:  2 plus pulses, no edema, no cyanosis, no clubbing Skin:  No rashes no nodules Neuro:  CN II through XII intact, motor grossly intact  Assess/Plan: 1. SVT - he is doing well after catheter ablation with no recurrent SVT. 2. Right groin bleeding - he has no evidence of a residual hematoma.   Mikle Bosworth.D.

## 2018-11-23 NOTE — Patient Instructions (Addendum)
Medication Instructions:  Your physician recommends that you continue on your current medications as directed. Please refer to the Current Medication list given to you today.  Labwork: None ordered.  Testing/Procedures: None ordered.  Follow-Up: Your physician wants you to follow-up in: 3 months with Dr. Taylor.      Any Other Special Instructions Will Be Listed Below (If Applicable).  If you need a refill on your cardiac medications before your next appointment, please call your pharmacy.   

## 2018-12-07 DIAGNOSIS — E785 Hyperlipidemia, unspecified: Secondary | ICD-10-CM | POA: Diagnosis not present

## 2018-12-07 DIAGNOSIS — Z01021 Encounter for examination of eyes and vision following failed vision screening with abnormal findings: Secondary | ICD-10-CM | POA: Diagnosis not present

## 2018-12-07 DIAGNOSIS — I10 Essential (primary) hypertension: Secondary | ICD-10-CM | POA: Diagnosis not present

## 2018-12-07 DIAGNOSIS — Z0001 Encounter for general adult medical examination with abnormal findings: Secondary | ICD-10-CM | POA: Diagnosis not present

## 2018-12-07 DIAGNOSIS — Z131 Encounter for screening for diabetes mellitus: Secondary | ICD-10-CM | POA: Diagnosis not present

## 2018-12-07 DIAGNOSIS — I119 Hypertensive heart disease without heart failure: Secondary | ICD-10-CM | POA: Diagnosis not present

## 2018-12-07 DIAGNOSIS — Z8673 Personal history of transient ischemic attack (TIA), and cerebral infarction without residual deficits: Secondary | ICD-10-CM | POA: Diagnosis not present

## 2018-12-07 DIAGNOSIS — G4733 Obstructive sleep apnea (adult) (pediatric): Secondary | ICD-10-CM | POA: Diagnosis not present

## 2018-12-07 DIAGNOSIS — Z01118 Encounter for examination of ears and hearing with other abnormal findings: Secondary | ICD-10-CM | POA: Diagnosis not present

## 2018-12-07 DIAGNOSIS — H538 Other visual disturbances: Secondary | ICD-10-CM | POA: Diagnosis not present

## 2018-12-07 DIAGNOSIS — Z1329 Encounter for screening for other suspected endocrine disorder: Secondary | ICD-10-CM | POA: Diagnosis not present

## 2018-12-10 DIAGNOSIS — D649 Anemia, unspecified: Secondary | ICD-10-CM | POA: Diagnosis not present

## 2018-12-10 DIAGNOSIS — I10 Essential (primary) hypertension: Secondary | ICD-10-CM | POA: Diagnosis not present

## 2018-12-10 DIAGNOSIS — D631 Anemia in chronic kidney disease: Secondary | ICD-10-CM | POA: Diagnosis not present

## 2018-12-10 DIAGNOSIS — E559 Vitamin D deficiency, unspecified: Secondary | ICD-10-CM | POA: Diagnosis not present

## 2018-12-10 DIAGNOSIS — D518 Other vitamin B12 deficiency anemias: Secondary | ICD-10-CM | POA: Diagnosis not present

## 2018-12-10 DIAGNOSIS — N182 Chronic kidney disease, stage 2 (mild): Secondary | ICD-10-CM | POA: Diagnosis not present

## 2018-12-10 DIAGNOSIS — D509 Iron deficiency anemia, unspecified: Secondary | ICD-10-CM | POA: Diagnosis not present

## 2018-12-10 DIAGNOSIS — N183 Chronic kidney disease, stage 3 (moderate): Secondary | ICD-10-CM | POA: Diagnosis not present

## 2018-12-13 DIAGNOSIS — N390 Urinary tract infection, site not specified: Secondary | ICD-10-CM | POA: Diagnosis not present

## 2018-12-13 DIAGNOSIS — I10 Essential (primary) hypertension: Secondary | ICD-10-CM | POA: Diagnosis not present

## 2018-12-16 ENCOUNTER — Ambulatory Visit: Payer: Medicare PPO | Admitting: Internal Medicine

## 2019-01-19 ENCOUNTER — Other Ambulatory Visit: Payer: Self-pay

## 2019-01-19 ENCOUNTER — Ambulatory Visit
Admission: EM | Admit: 2019-01-19 | Discharge: 2019-01-19 | Disposition: A | Discharge status: Home / Self Care | Payer: No Typology Code available for payment source | Attending: Emergency Medicine | Admitting: Emergency Medicine

## 2019-01-19 DIAGNOSIS — R5383 Other fatigue: Secondary | ICD-10-CM

## 2019-01-19 NOTE — ED Triage Notes (Signed)
Pt c/o no energy for months. States can't go to her PCP d/t its the [place] and they won't see him.

## 2019-01-19 NOTE — ED Provider Notes (Signed)
[name]    CSN: [id number] Arrival date & time: 01/19/19  1147     History   Chief Complaint Chief Complaint  Patient presents with  . Fatigue    HPI Dustin Hamilton is a 78 y.o. male with history of CVA, hypertension, SVT, cranial hemorrhage presenting for 58-month course of fatigue.  Patient states he is followed by the [facility], though has not been able to schedule an appointment for routine care/work-up due to COVID.  Patient states that his sleep habits have been normal for him, reports good appetite/intake.  States he made appointment today because his wife, who does not accompany him, requested it.  States that he gets more tired when he is exerting himself, though denies cough, wheezing, shortness of breath, chest pain during this time.  Patient states that his fatigue improves when he rests after exerting self.  Patient denies recent change to medication, travel, sick exposures.  "Sitting here I am fine".  Of note, patient underwent SVT ablation on 6/15 and had postoperative bleeding for which she went to the ER on 6/16 - Records reviewed by me at time of appointment: Right groin catheter site oozing, 2 simple interrupted sutures placed no change in medications.  Patient denies having issues since then.   Past Medical History:  Diagnosis Date  . BPH (benign prostatic hyperplasia)   . Cerebrovascular disease   . Congenital anomaly of diaphragm   . Elevated PSA   . Glaucoma, both eyes   . Hemorrhoid   . Hepatitis B surface antigen positive    02-20-2011  . History of adenomatous polyp of colon    2007, 2009 and 2013  tubular adenoma's  . History of alcohol abuse    quit 1963  . History of cerebral parenchymal hemorrhage    01/ 2006  left occiptial lobe related to hypertensive crisis  . History of CVA (cerebrovascular accident)    09-12-2012  left hippocampus/ amygdala junction and per MRI old white matter infarcts--  per pt residual short- term memory issues   . History of fatty infiltration of liver hx visit's at [facility] , last visit 05/ 2014   elvated LFT's ,  via liver bx 2004 related to hx alcohol and drug abuse (quit 1964)  . 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 HTN (hypertension)   . Renal cyst, left   . Stroke ([place])    hx of 3 strokes in past   . 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 Urethral lesion    urethral mass    Patient Active Problem List   Diagnosis Date Noted  . PSVT (paroxysmal supraventricular tachycardia) ([place]) 06/03/2017  . Dizziness 08/02/2014  . 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 CVA (cerebral infarction) 09/13/2012  . Vertigo 09/12/2012  . EAR PAIN 09/05/2009  . BACK PAIN 06/20/2009  . COLONIC POLYPS 05/06/2007  . 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 NONDEPENDENT ALCOHOL ABUSE IN REMISSION 05/06/2007  . HYPERTENSION, SEVERE 05/06/2007  . Hypertensive heart disease without heart failure 05/06/2007  . INTRACRANIAL HEMORRHAGE 05/06/2007  . Cerebrovascular disease 05/06/2007  . HEMORRHOIDS 05/06/2007  . INGUINAL HERNIA, RIGHT 05/06/2007  . RENAL CYST 05/06/2007  . HEMATURIA UNSPECIFIED 05/06/2007  . 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 PSA, INCREASED 05/06/2007  . LIVER FUNCTION TESTS, ABNORMAL 05/06/2007  . BPH (benign prostatic hypertrophy) with urinary obstruction 05/06/2007  . PERS HX  NONCOMPLIANCE W/MED TX PRS HAZARDS HLTH 05/06/2007    Past Surgical History:  Procedure Laterality Date  . CARDIOVASCULAR STRESS TEST  05/05/2007   normal nuclear study w/ no ischemia/  normal LV fucntion and wall motion , ef60%  . COLONOSCOPY  last one 04-06-2012  . CYSTO/  LEFT RETROGRADE PYELOGRAM/ CYTOLOGY WASHINGS/  URETEROSCOPY  03/05/2000  . INGUINAL HERNIA REPAIR Bilateral 1965 and 1980's  . LAPAROSCOPIC INGUINAL HERNIA WITH UMBILICAL HERNIA Right 16/06/930  . LIVER BIOPSY  1980's and 2004  . SVT ABLATION N/A  11/15/2018   Procedure: SVT ABLATION;  Surgeon: Evans Lance, MD;  Location: [facility];  Service: Cardiovascular;  Laterality: N/A;  . TRANSTHORACIC ECHOCARDIOGRAM  09/13/2012   moderate LVH,  ef 60-65%/    . TRANSURETHRAL RESECTION OF BLADDER TUMOR N/A 08/11/2016   Procedure: TRANSURETHRAL RESECTION OF BLADDER TUMOR (TURBT);  Surgeon: Cleon Gustin, MD;  Location[facility];  Service: Urology;  Laterality: N/A;       Home Medications    Prior to Admission medications   Medication Sig Start Date End Date Taking? Authorizing Provider  amLODipine (NORVASC) 5 MG tablet Take 5 mg by mouth daily.    [provider]  aspirin EC 81 MG tablet Take 81 mg by mouth daily.    [provider]  brimonidine (ALPHAGAN) 0.2 % ophthalmic solution Place 1 drop into both eyes 2 (two) times daily.    [provider]  carvedilol (COREG) 25 MG tablet Take 25 mg by mouth 2 (two) times a day.     [provider]  cetirizine (ZYRTEC) 5 MG chewable tablet Chew 1 tablet (5 mg total) by mouth daily. 06/04/18   Wurst, [place], PA-C  clopidogrel (PLAVIX) 75 MG tablet Take 1 tablet (75 mg total) by mouth daily with breakfast. 05/06/13   Noralee Space, MD  finasteride (PROSCAR) 5 MG tablet Take 5 mg by mouth daily.     [provider]  latanoprost (XALATAN) 0.005 % ophthalmic solution Place 1 drop into both eyes at bedtime.     [provider]  tamsulosin (FLOMAX) 0.4 MG CAPS capsule Take 0.4 mg by mouth at bedtime.     [provider]    Family History Family History  Problem Relation Age of Onset  . Rheum arthritis Mother   . Diabetes Mother   . Stroke Mother   . Heart attack Mother   . Kidney failure Mother   . Heart attack Father   . Heart disease Maternal Grandmother   . Rheum arthritis Maternal Grandmother   . Diabetes Maternal Grandmother   . Stroke Maternal Grandmother   . Colon cancer Neg Hx     Social  History Social History   Tobacco Use  . Smoking status: Former Smoker    Packs/day: 1.00    Years: 5.00    Pack years: 5.00    Types: Cigarettes    Quit date: 11/30/1981    Years since quitting: 37.1  . Smokeless tobacco: Never Used  Substance Use Topics  . Alcohol use: No    Alcohol/week: 0.0 standard drinks    Comment: hx abuse -- quit:  1963  . Drug use: No    Comment: hx abuse -- quit 1964 (iv heroin and cocaine     Allergies   Penicillins   Review of Systems Review of Systems  Constitutional: Positive for fatigue. Negative for activity change, appetite change and fever.  HENT: Negative for  congestion, hearing loss, sore throat, tinnitus, trouble swallowing and voice change.   Eyes: Negative for photophobia, pain and visual disturbance.  Respiratory: Negative for cough and shortness of breath.   Cardiovascular: Negative for chest pain and palpitations.  Gastrointestinal: Negative for abdominal pain, diarrhea, nausea and vomiting.  Endocrine: Negative for polydipsia, polyphagia and polyuria.  Genitourinary: Negative for dysuria, frequency, hematuria and urgency.  Musculoskeletal: Negative for arthralgias, back pain, gait problem, myalgias and neck pain.  Skin: Negative for rash and wound.  Neurological: Negative for dizziness, tremors, syncope, facial asymmetry, speech difficulty, weakness, light-headedness, numbness and headaches.  Hematological: Does not bruise/bleed easily.  Psychiatric/Behavioral: Negative for agitation, confusion and hallucinations.  All other systems reviewed and are negative.    Physical Exam Triage Vital Signs ED Triage Vitals  Enc Vitals Group     BP 01/19/19 1159 124/73     Pulse Rate 01/19/19 1159 65     Resp 01/19/19 1159 20     Temp 01/19/19 1159 98.1 F (36.7 C)     Temp Source 01/19/19 1159 Oral     SpO2 01/19/19 1159 93 %     Weight --      Height --      Head Circumference --      Peak Flow --      Pain Score 01/19/19 1200  0     Pain Loc --      Pain Edu? --      Excl. in [place]? --    No data found.  Updated Vital Signs BP 124/73 (BP Location: Left Arm)   Pulse 65   Temp 98.1 F (36.7 C) (Oral)   Resp 20   SpO2 93%    Physical Exam Constitutional:      General: He is not in acute distress. HENT:     Head: Normocephalic and atraumatic.     Mouth/Throat:     Mouth: Mucous membranes are moist.     Pharynx: Oropharynx is clear. No pharyngeal swelling or oropharyngeal exudate.  Eyes:     General: No scleral icterus.       Right eye: No discharge.        Left eye: No discharge.     Extraocular Movements: Extraocular movements intact.     Conjunctiva/sclera: Conjunctivae normal.     Pupils: Pupils are equal, round, and reactive to light.  Neck:     Musculoskeletal: Normal range of motion and neck supple. No muscular tenderness.     Vascular: No JVD.     Trachea: No tracheal deviation.  Cardiovascular:     Rate and Rhythm: Normal rate and regular rhythm.     Heart sounds: Normal heart sounds.  Pulmonary:     Effort: Pulmonary effort is normal. No respiratory distress.     Breath sounds: No decreased breath sounds, wheezing, rhonchi or rales.  Chest:     Chest wall: No deformity, tenderness or crepitus.  Abdominal:     General: Abdomen is flat. Bowel sounds are normal.     Tenderness: There is no abdominal tenderness. There is no right CVA tenderness or left CVA tenderness.  Musculoskeletal: Normal range of motion.     Right lower leg: He exhibits no tenderness. No edema.     Left lower leg: He exhibits no tenderness. No edema.  Lymphadenopathy:     Cervical: No cervical adenopathy.  Skin:    General: Skin is warm.     Capillary Refill: Capillary refill takes less than  2 seconds.     Coloration: Skin is not cyanotic, jaundiced or pale.  Neurological:     General: No focal deficit present.     Mental Status: He is alert and oriented to person, place, and time.     Cranial Nerves: No cranial  nerve deficit.     Sensory: No sensory deficit.     Motor: No weakness.     Coordination: Coordination normal.     Gait: Gait normal.     Deep Tendon Reflexes: Reflexes normal.  Psychiatric:        Mood and Affect: Mood normal.        Behavior: Behavior normal.        Judgment: Judgment normal.      UC Treatments / Results  Labs (all labs ordered are listed, but only abnormal results are displayed) Labs Reviewed - No data to display  EKG   Radiology No results found.  Procedures Procedures (including critical care time)  Medications Ordered in UC Medications - No data to display  Initial Impression / Assessment and Plan / UC Course  I have reviewed the triage vital signs and the nursing notes.  Pertinent labs & imaging results that were available during my care of the patient were reviewed by me and considered in my medical decision making (see chart for details).     1.  Fatigue Appears to be chronic at this point in time without acute exacerbate her or shift in severity.  EKG done in office given patient's cardiac history, reviewed by me and compared to previous from 11/19/2018: Sinus bradycardia with first-degree AV block and right axis deviation.  QTc prolongation or ST elevation.  Waveforms stable in all leads.  Reviewed case with Dr. Mannie Stabile who agrees with assessment, plan.  At this time further diagnostics including CBC, TSH would be low yield as patient does not have history of anemia, and previous lab draws were all within normal limits.  CBC last done 6/12: Hemoglobin 17.2 with RBC count of 5.67, patient does not have active signs/symptoms of active bleeding.  Viewed findings with patient, who is agreeable to not doing further diagnostics.  States he will follow-up with his cardiologist and PCP tomorrow.  Return precautions discussed, patient verbalized understanding and is agreeable to plan.  Final Clinical Impressions(s) / UC Diagnoses   Final diagnoses:   Fatigue, unspecified type     Discharge Instructions     Continue all your current medications per Call your cardiologist to relay symptoms. Call PCP to see if urgent care follow-up can be expedited, important to keep September appointment. Go to ER for further evaluation if you develop chest pain, shortness of breath, lower leg swelling, dizziness, confusion.    ED Prescriptions    None     Controlled Substance Prescriptions [place] Controlled Substance Registry consulted? Not Applicable   Quincy Sheehan, [place] 01/20/19 1058

## 2019-01-19 NOTE — Discharge Instructions (Signed)
Continue all your current medications per Call your cardiologist to relay symptoms. Call PCP to see if urgent care follow-up can be expedited, important to keep September appointment. Go to ER for further evaluation if you develop chest pain, shortness of breath, lower leg swelling, dizziness, confusion.

## 2019-01-27 DIAGNOSIS — I119 Hypertensive heart disease without heart failure: Secondary | ICD-10-CM | POA: Diagnosis not present

## 2019-01-27 DIAGNOSIS — R7303 Prediabetes: Secondary | ICD-10-CM | POA: Diagnosis not present

## 2019-01-27 DIAGNOSIS — Z1389 Encounter for screening for other disorder: Secondary | ICD-10-CM | POA: Diagnosis not present

## 2019-01-27 DIAGNOSIS — Z720 Tobacco use: Secondary | ICD-10-CM | POA: Diagnosis not present

## 2019-01-27 DIAGNOSIS — Z8673 Personal history of transient ischemic attack (TIA), and cerebral infarction without residual deficits: Secondary | ICD-10-CM | POA: Diagnosis not present

## 2019-01-27 DIAGNOSIS — I10 Essential (primary) hypertension: Secondary | ICD-10-CM | POA: Diagnosis not present

## 2019-01-27 DIAGNOSIS — Z0001 Encounter for general adult medical examination with abnormal findings: Secondary | ICD-10-CM | POA: Diagnosis not present

## 2019-01-27 DIAGNOSIS — G4733 Obstructive sleep apnea (adult) (pediatric): Secondary | ICD-10-CM | POA: Diagnosis not present

## 2019-01-27 DIAGNOSIS — E785 Hyperlipidemia, unspecified: Secondary | ICD-10-CM | POA: Diagnosis not present

## 2019-02-01 DIAGNOSIS — M545 Low back pain: Secondary | ICD-10-CM | POA: Diagnosis not present

## 2019-02-18 ENCOUNTER — Ambulatory Visit: Payer: Medicare PPO | Attending: Internal Medicine | Admitting: Physical Therapy

## 2019-02-18 ENCOUNTER — Encounter: Payer: Self-pay | Admitting: Physical Therapy

## 2019-02-18 ENCOUNTER — Other Ambulatory Visit: Payer: Self-pay

## 2019-02-18 DIAGNOSIS — G8929 Other chronic pain: Secondary | ICD-10-CM | POA: Diagnosis not present

## 2019-02-18 DIAGNOSIS — M545 Low back pain, unspecified: Secondary | ICD-10-CM

## 2019-02-18 DIAGNOSIS — I119 Hypertensive heart disease without heart failure: Secondary | ICD-10-CM | POA: Diagnosis not present

## 2019-02-18 DIAGNOSIS — M6281 Muscle weakness (generalized): Secondary | ICD-10-CM | POA: Diagnosis not present

## 2019-02-18 DIAGNOSIS — I10 Essential (primary) hypertension: Secondary | ICD-10-CM | POA: Diagnosis not present

## 2019-02-18 DIAGNOSIS — R262 Difficulty in walking, not elsewhere classified: Secondary | ICD-10-CM | POA: Insufficient documentation

## 2019-02-18 DIAGNOSIS — Z720 Tobacco use: Secondary | ICD-10-CM | POA: Diagnosis not present

## 2019-02-18 NOTE — Therapy (Signed)
[place], [place], [address] Phone: [phone]   Fax:  [phone]  Physical Therapy Evaluation  Patient Details  Name: Dustin Hamilton MRN: [id number] Date of Birth: 1940-06-15 Referring Provider (PT): Benito Mccreedy MD   Encounter Date: 02/18/2019  PT End of Session - 02/18/19 1321    Visit Number  1    Number of Visits  16    Authorization Type  [employer]    PT Start Time  [id number]    PT Stop Time  1100    PT Time Calculation (min)  45 min    Activity Tolerance  Patient tolerated treatment well    Behavior During Therapy  [facility] for tasks assessed/performed       Past Medical History:  Diagnosis Date  . BPH (benign prostatic hyperplasia)   . Cerebrovascular disease   . Congenital anomaly of diaphragm   . Elevated PSA   . Glaucoma, both eyes   . Hemorrhoid   . Hepatitis B surface antigen positive    02-20-2011  . History of adenomatous polyp of colon    2007, 2009 and 2013  tubular adenoma's  . History of alcohol abuse    quit 1963  . History of cerebral parenchymal hemorrhage    01/ 2006  left occiptial lobe related to hypertensive crisis  . History of CVA (cerebrovascular accident)    09-12-2012  left hippocampus/ amygdala junction and per MRI old white matter infarcts--  per pt residual short- term memory issues  . History of fatty infiltration of liver hx visit's at [facility] , last visit 05/ 2014   elvated LFT's ,  via liver bx 2004 related to hx alcohol and drug abuse (quit 1964)  . 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 HTN (hypertension)   . Renal cyst, left   . Stroke ([place])    hx of 3 strokes in past   . 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 Urethral lesion    urethral mass    Past Surgical History:  Procedure Laterality Date  . CARDIOVASCULAR STRESS TEST  05/05/2007   normal nuclear study w/ no  ischemia/  normal LV fucntion and wall motion , ef60%  . COLONOSCOPY  last one 04-06-2012  . CYSTO/  LEFT RETROGRADE PYELOGRAM/ CYTOLOGY WASHINGS/  URETEROSCOPY  03/05/2000  . INGUINAL HERNIA REPAIR Bilateral 1965 and 1980's  . LAPAROSCOPIC INGUINAL HERNIA WITH UMBILICAL HERNIA Right [id number]  . LIVER BIOPSY  1980's and 2004  . SVT ABLATION N/A 11/15/2018   Procedure: SVT ABLATION;  Surgeon: Evans Lance, MD;  Location: [place] [facility];  Service: Cardiovascular;  Laterality: N/A;  . TRANSTHORACIC ECHOCARDIOGRAM  09/13/2012   moderate LVH,  ef 60-65%/    . TRANSURETHRAL RESECTION OF BLADDER TUMOR N/A 08/11/2016   Procedure: TRANSURETHRAL RESECTION OF BLADDER TUMOR (TURBT);  Surgeon: Cleon Gustin, MD;  Location: [facility];  Service: Urology;  Laterality: N/A;    There were no vitals filed for this visit.   Subjective Assessment - 02/18/19 1029    Subjective  Pt reporting low back pain that has been going on for 7-8 months. Pt reporting he thought he was having kidney stones, but found it, it was his back. Pt reporting history of CVA's the last one 2 months ago. Pt reporting he couldn't afford to pay for his pain  meds the MD prescribed.    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    Limitations  Walking;Standing;House hold activities    Patient Stated Goals  Be able to move without pain and walk without pain    Currently in Pain?  No/denies   only reporting muscle fatigue        [facility] PT Assessment - 02/18/19 0001      Assessment   Medical Diagnosis  Low back pain    Referring Provider (PT)  Osei-Bonsu, George MD    Onset Date/Surgical Date  --   6-7 months ago   Hand Dominance  Right    Next MD Visit  follow up TBD    Prior Therapy  a couple of  years ago, Neuro therapy       Precautions   Precautions  None      Restrictions   Weight Bearing Restrictions  No      Balance Screen    Has the patient fallen in the past 6 months  No    Is the patient reluctant to leave their home because of a fear of falling?   No      Home Environment   Living Environment  Private residence    Living Arrangements  Spouse/significant other    Type of [place] to enter    Entrance Stairs-Number of Steps  2    Entrance Stairs-Rails  None    Home Layout  Two level    Additional Comments  Pt has a lift chair to get him up the steps in his home.       Prior Function   Level of Independence  Independent with basic ADLs    Vocation  Retired    [employer]  retired Midwife on tv, watching sports      Cognition   Overall Cognitive Status  Within Functional Limits for tasks assessed      Observation/Other Assessments   Focus on Therapeutic Outcomes (FOTO)   66 % limitation      Posture/Postural Control   Posture/Postural Control  Postural limitations    Postural Limitations  Forward head;Decreased lumbar lordosis      ROM / Strength   AROM / PROM / Strength  Strength      Strength   Strength Assessment Site  Hip    Right/Left Hip  Right;Left    Right Hip Flexion  3+/5    Right Hip ABduction  3+/5    Right Hip ADduction  3+/5    Left Hip Flexion  4-/5    Left Hip ABduction  3+/5    Left Hip ADduction  3+/5      Flexibility   Soft Tissue Assessment /Muscle Length  yes    Hamstrings  R: 48, L: 60      Palpation   Palpation comment  TTP on lumbar paraspinals      Transfers   Five time sit to stand comments   19 seconds with UE support      Standardized Balance Assessment   Standardized Balance Assessment  Berg Balance Test      Berg Balance Test   Sit to Stand  Able to stand  independently using hands    Standing Unsupported  Able to stand safely 2 minutes    Sitting with Back Unsupported but  Feet Supported on Floor or Stool  Able to sit safely and securely 2 minutes    Stand to Sit  Controls descent by using  hands    Transfers  Able to transfer safely, definite need of hands    Standing Unsupported with Eyes Closed  Able to stand 10 seconds with supervision    Standing Unsupported with Feet Together  Able to place feet together independently and stand for 1 minute with supervision    From Standing, Reach Forward with Outstretched Arm  Can reach forward >12 cm safely (5")    From Standing Position, Pick up Object from Floor  Able to pick up shoe, needs supervision    From Standing Position, Turn to Look Behind Over each Shoulder  Turn sideways only but maintains balance    Turn 360 Degrees  Able to turn 360 degrees safely but slowly    Standing Unsupported, Alternately Place Feet on Step/Stool  Able to complete >2 steps/needs minimal assist    Standing Unsupported, One Foot in Front  Needs help to step but can hold 15 seconds    Standing on One Leg  Unable to try or needs assist to prevent fall    Total Score  35                Objective measurements completed on examination: See above findings.              PT Education - 02/18/19 1320    Education Details  HEP, PT POC    Person(s) Educated  Patient    Methods  Explanation;Demonstration;Handout    Comprehension  Verbalized understanding;Returned demonstration       PT Short Term Goals - 02/18/19 1345      PT SHORT TERM GOAL #1   Title  Pt will be indepdendent in his HEP.    Baseline  initial HEP began on 02/18/2019    Time  3    Period  Weeks    Status  New    Target Date  03/11/19      PT SHORT TERM GOAL #2   Title  Pt will tolerate 20 minutes of ther exercise with no rest breaks to improve endurance.    Time  4    Period  Weeks    Status  New    Target Date  03/18/19        PT Long Term Goals - 02/18/19 1334      PT LONG TERM GOAL #1   Title  Pt will be able to improve BERG balance score from 35/56 to >/= 43/56.    Baseline  35/56 on 02/18/2019    Time  8    Period  Weeks    Status  New    Target  Date  04/15/19      PT LONG TERM GOAL #2   Title  Pt will amb 15 minutes with no rest breaks with step through gait pattern with LRAD on level surfaces.    Baseline  pt reporting he only walks short distances around his home and he becomes SOB.    Time  8    Period  Weeks    Status  New    Target Date  04/15/19      PT LONG TERM GOAL #3   Title  Pt will improve his bilateral LE strength to >/= 4/5 in order to improve gait and functional mobility.    Baseline  see flow sheets  Time  8    Period  Weeks    Status  New    Target Date  04/15/19             Plan - 02/18/19 1322    Clinical Impression Statement  Pt presenting for PT evaluation reporting difficulty with walking and intermittent low back pain. Pt with stiffness noted during amb with decreasd arm swing and trunk rotation. Pt amb with a straight cane in his R UE with decreased foot clearance bilaterally.  Pt presenting with decreased trunk mobility with no lumbar curvature. Pt with weakness in bilateral hips. BERG balance score of 35/56.    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    Examination-Activity Limitations  Bend;Transfers;Stairs;Stand;Sit    Examination-Participation Restrictions  Community Activity    Stability/Clinical Decision Making  Evolving/Moderate complexity    Clinical Decision Making  Moderate    Rehab Potential  Fair    PT Frequency  2x / week    PT Duration  8 weeks    PT Treatment/Interventions  ADLs/Self Care Home Management;Moist Heat;Ultrasound;Stair training;Gait training;Functional mobility training;Therapeutic activities;Therapeutic exercise;Balance training;Neuromuscular re-education;Patient/family education;Manual techniques;Passive range of motion    PT Next Visit Plan  Nustep, LE strnegthening, balance exercises, hamstring stretching, core strengthening    PT Home  Exercise Plan  Access Code: [email or web address]    Consulted and Agree with Plan of Care  Patient       Patient will benefit from skilled therapeutic intervention in order to improve the following deficits and impairments:  Abnormal gait, Pain, Postural dysfunction, Impaired flexibility, Decreased strength, Decreased activity tolerance, Decreased range of motion, Difficulty walking, Decreased balance, Cardiopulmonary status limiting activity  Visit Diagnosis: Chronic bilateral low back pain without sciatica  Difficulty in walking, not elsewhere classified  Muscle weakness (generalized)     Problem List Patient Active Problem List   Diagnosis Date Noted  . PSVT (paroxysmal supraventricular tachycardia) ([place]) 06/03/2017  . Dizziness 08/02/2014  . 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 CVA (cerebral infarction) 09/13/2012  . Vertigo 09/12/2012  . EAR PAIN 09/05/2009  . BACK PAIN 06/20/2009  . COLONIC POLYPS 05/06/2007  . 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 NONDEPENDENT ALCOHOL ABUSE IN REMISSION 05/06/2007  . HYPERTENSION, SEVERE 05/06/2007  . Hypertensive heart disease without heart failure 05/06/2007  . INTRACRANIAL HEMORRHAGE 05/06/2007  . Cerebrovascular disease 05/06/2007  . HEMORRHOIDS 05/06/2007  . INGUINAL HERNIA, RIGHT 05/06/2007  . RENAL CYST 05/06/2007  . HEMATURIA UNSPECIFIED 05/06/2007  . 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 PSA, INCREASED 05/06/2007  . LIVER FUNCTION TESTS, ABNORMAL 05/06/2007  . BPH (benign prostatic hypertrophy) with urinary obstruction 05/06/2007  . PERS HX NONCOMPLIANCE W/MED [facility] 05/06/2007    Oretha Caprice , PT 02/18/2019, 1:46 PM  [facility] [address] [place], [place], [address] Phone: [phone]   Fax:  [phone]  Name: Dustin Hamilton MRN: [id number] Date of Birth: 1940/07/27

## 2019-02-24 ENCOUNTER — Ambulatory Visit (INDEPENDENT_AMBULATORY_CARE_PROVIDER_SITE_OTHER): Payer: No Typology Code available for payment source | Admitting: Internal Medicine

## 2019-02-24 ENCOUNTER — Other Ambulatory Visit: Payer: Self-pay

## 2019-02-24 ENCOUNTER — Encounter: Payer: Self-pay | Admitting: Internal Medicine

## 2019-02-24 VITALS — BP 122/78 | HR 87 | Ht 71.0 in | Wt 168.0 lb

## 2019-02-24 DIAGNOSIS — I471 Supraventricular tachycardia: Secondary | ICD-10-CM | POA: Diagnosis not present

## 2019-02-24 NOTE — Progress Notes (Signed)
HPI [name] returns today for followup. 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 He has been stable in the interim although he does note dizziness. No palpitations.  Allergies  Allergen Reactions  . Penicillins Hives    Has patient had a PCN reaction causing immediate rash, facial/tongue/throat swelling, SOB or lightheadedness with hypotension: Yes Has patient had a PCN reaction causing severe rash involving mucus membranes or skin necrosis: Yes Has patient had a PCN reaction that required hospitalization No Has patient had a PCN reaction occurring within the last 10 years: No If all of the above answers are "NO", then may proceed with Cephalosporin use.      Current Outpatient Medications  Medication Sig Dispense Refill  . amLODipine (NORVASC) 5 MG tablet Take 5 mg by mouth daily.    Marland Kitchen aspirin EC 81 MG tablet Take 81 mg by mouth daily.    . brimonidine (ALPHAGAN) 0.2 % ophthalmic solution Place 1 drop into both eyes 2 (two) times daily.    . carvedilol (COREG) 25 MG tablet Take 25 mg by mouth 2 (two) times a day.     . cetirizine (ZYRTEC) 5 MG chewable tablet Chew 1 tablet (5 mg total) by mouth daily. 20 tablet 0  . clopidogrel (PLAVIX) 75 MG tablet Take 1 tablet (75 mg total) by mouth daily with breakfast. 30 tablet 11  . finasteride (PROSCAR) 5 MG tablet Take 5 mg by mouth daily.     Marland Kitchen latanoprost (XALATAN) 0.005 % ophthalmic solution Place 1 drop into both eyes at bedtime.     . tamsulosin (FLOMAX) 0.4 MG CAPS capsule Take 0.4 mg by mouth at bedtime.      No current facility-administered medications for this visit.      Past Medical History:  Diagnosis Date  . BPH (benign prostatic hyperplasia)   . Cerebrovascular disease   . Congenital anomaly of diaphragm   . Elevated PSA   . Glaucoma, both eyes   . Hemorrhoid   . Hepatitis B surface antigen positive    02-20-2011  . History of adenomatous polyp of  colon    2007, 2009 and 2013  tubular adenoma's  . History of alcohol abuse    quit 1963  . History of cerebral parenchymal hemorrhage    01/ 2006  left occiptial lobe related to hypertensive crisis  . History of CVA (cerebrovascular accident)    09-12-2012  left hippocampus/ amygdala junction and per MRI old white matter infarcts--  per pt residual short- term memory issues  . History of fatty infiltration of liver hx visit's at [facility] , last visit 05/ 2014   elvated LFT's ,  via liver bx 2004 related to hx alcohol and drug abuse (quit 1964)  . 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 HTN (hypertension)   . Renal cyst, left   . Stroke ([place])    hx of 3 strokes in past   . 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 Urethral lesion    urethral mass    ROS:   All systems reviewed and negative except as noted in the HPI.   Past Surgical History:  Procedure Laterality Date  . CARDIOVASCULAR STRESS TEST  05/05/2007   normal nuclear study w/ no ischemia/  normal LV fucntion and wall motion ,  ef60%  . COLONOSCOPY  last one 04-06-2012  . CYSTO/  LEFT RETROGRADE PYELOGRAM/ CYTOLOGY WASHINGS/  URETEROSCOPY  03/05/2000  . INGUINAL HERNIA REPAIR Bilateral 1965 and 1980's  . LAPAROSCOPIC INGUINAL HERNIA WITH UMBILICAL HERNIA Right [id number]  . LIVER BIOPSY  1980's and 2004  . SVT ABLATION N/A 11/15/2018   Procedure: SVT ABLATION;  Surgeon: Evans Lance, MD;  Location: [place] [facility];  Service: Cardiovascular;  Laterality: N/A;  . TRANSTHORACIC ECHOCARDIOGRAM  09/13/2012   moderate LVH,  ef 60-65%/    . TRANSURETHRAL RESECTION OF BLADDER TUMOR N/A 08/11/2016   Procedure: TRANSURETHRAL RESECTION OF BLADDER TUMOR (TURBT);  Surgeon: Cleon Gustin, MD;  Location: [facility];  Service: Urology;  Laterality: N/A;     Family History  Problem Relation Age of Onset  . Rheum arthritis Mother   .  Diabetes Mother   . Stroke Mother   . Heart attack Mother   . Kidney failure Mother   . Heart attack Father   . Heart disease Maternal Grandmother   . Rheum arthritis Maternal Grandmother   . Diabetes Maternal Grandmother   . Stroke Maternal Grandmother   . Colon cancer Neg Hx      Social History   Socioeconomic History  . Marital status: Married    Spouse name: Dustin Hamilton  . Number of children: 1  . Years of education: College  . Highest education level: Not on file  Occupational History  . Occupation: Geophysicist/field seismologist  Social Needs  . Financial resource strain: Not on file  . Food insecurity    Worry: Not on file    Inability: Not on file  . Transportation needs    Medical: Not on file    Non-medical: Not on file  Tobacco Use  . Smoking status: Former Smoker    Packs/day: 1.00    Years: 5.00    Pack years: 5.00    Types: Cigarettes    Quit date: 11/30/1981    Years since quitting: 37.2  . Smokeless tobacco: Never Used  Substance and Sexual Activity  . Alcohol use: No    Alcohol/week: 0.0 standard drinks    Comment: hx abuse -- quit:  1963  . 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 Sexual activity: Not on file  Lifestyle  . Physical activity    Days per week: Not on file    Minutes per session: Not on file  . Stress: Not on file  Relationships  . Social Herbalist on phone: Not on file    Gets together: Not on file    Attends religious service: Not on file    Active member of club or organization: Not on file    Attends meetings of clubs or organizations: Not on file    Relationship status: Not on file  . Intimate partner violence    Fear of current or ex partner: Not on file    Emotionally abused: Not on file    Physically abused: Not on file    Forced sexual activity: Not on file  Other Topics Concern  . Not on file  Social History Narrative   Patient lives at home with his spouse.   Caffeine Use:  Tea, lots      BP 122/78   Pulse 87   Ht 5\' 11"  (1.803 m)   Wt 168 lb (76.2 kg)   SpO2 97%  BMI 23.43 kg/m   Physical Exam:  Well appearing NAD HEENT: Unremarkable Neck:  No JVD, no thyromegally Lymphatics:  No adenopathy Back:  No CVA tenderness Lungs:  Clear with no wheezes HEART:  Regular rate rhythm, no murmurs, no rubs, no clicks Abd:  soft, positive bowel sounds, no organomegally, no rebound, no guarding Ext:  2 plus pulses, no edema, no cyanosis, no clubbing Skin:  No rashes no nodules Neuro:  CN II through XII intact, motor grossly intact  DEVICE  Normal device function.  See PaceArt for details.   Assess/Plan: 1. SVT - he is s/p ablation of AVNRT. No recurrent symptoms. No chest pain.  2. Dizziness - he is demonstrably weak. I encouraged the patient to exercise his legs. He is weak.  3. HTN - his bp is well controlled today. No change in meds.  Mikle Bosworth.D

## 2019-02-24 NOTE — Patient Instructions (Signed)
Medication Instructions:  Your physician recommends that you continue on your current medications as directed. Please refer to the Current Medication list given to you today.  Labwork: None ordered.  Testing/Procedures: None ordered.  Follow-Up: Your physician wants you to follow-up in: as needed with Dr. Taylor.      Any Other Special Instructions Will Be Listed Below (If Applicable).  If you need a refill on your cardiac medications before your next appointment, please call your pharmacy.   

## 2019-02-28 DIAGNOSIS — R310 Gross hematuria: Secondary | ICD-10-CM | POA: Diagnosis not present

## 2019-02-28 DIAGNOSIS — N401 Enlarged prostate with lower urinary tract symptoms: Secondary | ICD-10-CM | POA: Diagnosis not present

## 2019-02-28 DIAGNOSIS — N3000 Acute cystitis without hematuria: Secondary | ICD-10-CM | POA: Diagnosis not present

## 2019-02-28 DIAGNOSIS — R3912 Poor urinary stream: Secondary | ICD-10-CM | POA: Diagnosis not present

## 2019-03-02 ENCOUNTER — Other Ambulatory Visit: Payer: Self-pay

## 2019-03-02 ENCOUNTER — Encounter: Payer: Self-pay | Admitting: Physical Therapy

## 2019-03-02 ENCOUNTER — Ambulatory Visit: Payer: Medicare PPO | Admitting: Physical Therapy

## 2019-03-02 DIAGNOSIS — R262 Difficulty in walking, not elsewhere classified: Secondary | ICD-10-CM | POA: Diagnosis not present

## 2019-03-02 DIAGNOSIS — M6281 Muscle weakness (generalized): Secondary | ICD-10-CM | POA: Diagnosis not present

## 2019-03-02 DIAGNOSIS — G8929 Other chronic pain: Secondary | ICD-10-CM | POA: Diagnosis not present

## 2019-03-02 DIAGNOSIS — M545 Low back pain: Secondary | ICD-10-CM | POA: Diagnosis not present

## 2019-03-02 NOTE — Therapy (Signed)
[place], [place], [address] Phone: [phone]   Fax:  [phone]  Physical Therapy Treatment  Patient Details  Name: Dustin Hamilton MRN: [id number] Date of Birth: Jul 27, 1940 Referring Provider (PT): Benito Mccreedy MD   Encounter Date: 03/02/2019  PT End of Session - 03/02/19 0926    Visit Number  2    Number of Visits  16    Date for PT Re-Evaluation  04/20/19    Authorization Type  [employer]    PT Start Time  0915    PT Stop Time  1000    PT Time Calculation (min)  45 min    Activity Tolerance  Patient tolerated treatment well    Behavior During Therapy  [facility] for tasks assessed/performed       Past Medical History:  Diagnosis Date  . BPH (benign prostatic hyperplasia)   . Cerebrovascular disease   . Congenital anomaly of diaphragm   . Elevated PSA   . Glaucoma, both eyes   . Hemorrhoid   . Hepatitis B surface antigen positive    02-20-2011  . History of adenomatous polyp of colon    2007, 2009 and 2013  tubular adenoma's  . History of alcohol abuse    quit 1963  . History of cerebral parenchymal hemorrhage    01/ 2006  left occiptial lobe related to hypertensive crisis  . History of CVA (cerebrovascular accident)    09-12-2012  left hippocampus/ amygdala junction and per MRI old white matter infarcts--  per pt residual short- term memory issues  . History of fatty infiltration of liver hx visit's at [facility] , last visit 05/ 2014   elvated LFT's ,  via liver bx 2004 related to hx alcohol and drug abuse (quit 1964)  . 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 HTN (hypertension)   . Renal cyst, left   . Stroke ([place])    hx of 3 strokes in past   . 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 Urethral lesion    urethral mass    Past Surgical History:  Procedure Laterality Date  . CARDIOVASCULAR STRESS TEST   05/05/2007   normal nuclear study w/ no ischemia/  normal LV fucntion and wall motion , ef60%  . COLONOSCOPY  last one 04-06-2012  . CYSTO/  LEFT RETROGRADE PYELOGRAM/ CYTOLOGY WASHINGS/  URETEROSCOPY  03/05/2000  . INGUINAL HERNIA REPAIR Bilateral 1965 and 1980's  . LAPAROSCOPIC INGUINAL HERNIA WITH UMBILICAL HERNIA Right [id number]  . LIVER BIOPSY  1980's and 2004  . SVT ABLATION N/A 11/15/2018   Procedure: SVT ABLATION;  Surgeon: Evans Lance, MD;  Location: [facility];  Service: Cardiovascular;  Laterality: N/A;  . TRANSTHORACIC ECHOCARDIOGRAM  09/13/2012   moderate LVH,  ef 60-65%/    . TRANSURETHRAL RESECTION OF BLADDER TUMOR N/A 08/11/2016   Procedure: TRANSURETHRAL RESECTION OF BLADDER TUMOR (TURBT);  Surgeon: Cleon Gustin, MD;  Location: [facility];  Service: Urology;  Laterality: N/A;    There were no vitals filed for this visit.  Subjective Assessment - 03/02/19 0923    Subjective  Pt reporting only mild soreness in his low back today of 1-2/10.    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    Limitations  Walking;Standing;House hold activities  Patient Stated Goals  Be able to move without pain and walk without pain    Currently in Pain?  Yes    Pain Score  2     Pain Location  Back    Pain Orientation  Lower;Right    Pain Descriptors / Indicators  Sore    Pain Type  Chronic pain    Pain Onset  More than a month ago    Pain Frequency  Intermittent    Aggravating Factors   sitting long periods, standing long periods    Pain Relieving Factors  changing positons    Effect of Pain on Daily Activities  difficulty with certain activities at [facility]/Exercise - 03/02/19 0001      Exercises   Exercises  Lumbar      Lumbar Exercises: Stretches   Active Hamstring Stretch  Right;Left;2 reps;30 seconds    Single Knee to  Chest Stretch  Right;Left;3 reps;20 seconds    Figure 4 Stretch  2 reps;20 seconds;Supine;With overpressure      Lumbar Exercises: Aerobic   Nustep  L3 x 6 minutes      Lumbar Exercises: Supine   Clam  10 reps;5 seconds    Bridge  10 reps;5 seconds    Bridge Limitations  decreased lift off and tactile cue required for technique    Straight Leg Raise  10 reps;2 seconds;Limitations    Straight Leg Raises Limitations  instructions to keep knee straight    Other Supine Lumbar Exercises  marching x 30, instructions to keep abs tight    Other Supine Lumbar Exercises  ball squeezes x 15 holding 5 seconds          Balance Exercises - 03/02/19 0941      Balance Exercises: Standing   Standing Eyes Opened  Narrow base of support (BOS);Foam/compliant surface;3 reps    Standing Eyes Closed  Narrow base of support (BOS);Wide (BOA);Foam/compliant surface;3 reps;20 secs;Limitations   CGA, pt with lateral lean to left   Standing, One Foot on a Step  Eyes open;4 inch   CGA   Marching Limitations  20 times with CGA for balance    Other Standing Exercises  tapping heels on 4 inch step with CGA    30 reps     OTAGO PROGRAM   Sit to Stand  5 reps, bilateral support      Balance Exercises: Standing   Standing Eyes Closed Limitations  CGA and lateral lean to left        PT Education - 03/02/19 0925    Education Details  reviewed HEP    Person(s) Educated  Patient    Methods  Explanation;Demonstration    Comprehension  Verbalized understanding;Returned demonstration       PT Short Term Goals - 03/02/19 0928      PT SHORT TERM GOAL #1   Title  Pt will be indepdendent in his HEP.    Baseline  initial HEP began on 02/18/2019    Time  3    Period  Weeks    Status  On-going      PT SHORT TERM GOAL #2   Title  Pt will tolerate 20 minutes of ther exercise with no rest breaks to improve endurance.    Time  4    Period  Weeks    Status  New  Target Date  03/18/19        PT Long  Term Goals - 02/18/19 1334      PT LONG TERM GOAL #1   Title  Pt will be able to improve BERG balance score from 35/56 to >/= 43/56.    Baseline  35/56 on 02/18/2019    Time  8    Period  Weeks    Status  New    Target Date  04/15/19      PT LONG TERM GOAL #2   Title  Pt will amb 15 minutes with no rest breaks with step through gait pattern with LRAD on level surfaces.    Baseline  pt reporting he only walks short distances around his home and he becomes SOB.    Time  8    Period  Weeks    Status  New    Target Date  04/15/19      PT LONG TERM GOAL #3   Title  Pt will improve his bilateral LE strength to >/= 4/5 in order to improve gait and functional mobility.    Baseline  see flow sheets    Time  8    Period  Weeks    Status  New    Target Date  04/15/19            Plan - 03/02/19 [id number]    Clinical Impression Statement  Pt tolerating lumbar stretching and strengthening as well as balances exercises well today. Pt requiring CGA via belt for balance exercises. Pt with lateral lean to left with eyes closed. Pt also  Continue skilled PT to progress toward goals set.    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    Examination-Activity Limitations  Bend;Transfers;Stairs;Stand;Sit    Examination-Participation Restrictions  Community Activity    Stability/Clinical Decision Making  Evolving/Moderate complexity    Rehab Potential  Fair    PT Frequency  2x / week    PT Duration  8 weeks    PT Treatment/Interventions  ADLs/Self Care Home Management;Moist Heat;Ultrasound;Stair training;Gait training;Functional mobility training;Therapeutic activities;Therapeutic exercise;Balance training;Neuromuscular re-education;Patient/family education;Manual techniques;Passive range of motion    PT Next Visit Plan  Nustep, LE strnegthening, balance exercises, hamstring stretching,  core strengthening    PT Home Exercise Plan  Access Code: [email or web address]    Consulted and Agree with Plan of Care  Patient       Patient will benefit from skilled therapeutic intervention in order to improve the following deficits and impairments:  Abnormal gait, Pain, Postural dysfunction, Impaired flexibility, Decreased strength, Decreased activity tolerance, Decreased range of motion, Difficulty walking, Decreased balance, Cardiopulmonary status limiting activity  Visit Diagnosis: Chronic bilateral low back pain without sciatica  Difficulty in walking, not elsewhere classified  Muscle weakness (generalized)     Problem List Patient Active Problem List   Diagnosis Date Noted  . SVT (supraventricular tachycardia) ([place]) 06/03/2017  . Dizziness 08/02/2014  . 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 CVA (cerebral infarction) 09/13/2012  . Vertigo 09/12/2012  . EAR PAIN 09/05/2009  . BACK PAIN 06/20/2009  . COLONIC POLYPS 05/06/2007  . 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 NONDEPENDENT ALCOHOL ABUSE IN REMISSION 05/06/2007  . HYPERTENSION, SEVERE 05/06/2007  . Hypertensive heart disease without heart failure 05/06/2007  . INTRACRANIAL HEMORRHAGE 05/06/2007  . Cerebrovascular disease 05/06/2007  . HEMORRHOIDS 05/06/2007  . INGUINAL HERNIA, RIGHT  05/06/2007  . RENAL CYST 05/06/2007  . HEMATURIA UNSPECIFIED 05/06/2007  . 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 PSA, INCREASED 05/06/2007  . LIVER FUNCTION TESTS, ABNORMAL 05/06/2007  . BPH (benign prostatic hypertrophy) with urinary obstruction 05/06/2007  . PERS HX NONCOMPLIANCE W/MED [facility] HAZARDS HLTH 05/06/2007    Oretha Caprice, PT 03/02/2019, 9:57 AM  [facility] [address] [place], [place], [address] Phone: [phone]   Fax:  [phone]  Name: Dustin Hamilton MRN: [id number] Date of Birth: 01/22/1941

## 2019-03-04 ENCOUNTER — Ambulatory Visit: Payer: Medicare PPO | Attending: Internal Medicine | Admitting: Physical Therapy

## 2019-03-04 ENCOUNTER — Encounter: Payer: Self-pay | Admitting: Physical Therapy

## 2019-03-04 ENCOUNTER — Other Ambulatory Visit: Payer: Self-pay

## 2019-03-04 DIAGNOSIS — G8929 Other chronic pain: Secondary | ICD-10-CM | POA: Insufficient documentation

## 2019-03-04 DIAGNOSIS — M545 Low back pain, unspecified: Secondary | ICD-10-CM

## 2019-03-04 DIAGNOSIS — M6281 Muscle weakness (generalized): Secondary | ICD-10-CM

## 2019-03-04 DIAGNOSIS — R262 Difficulty in walking, not elsewhere classified: Secondary | ICD-10-CM | POA: Diagnosis not present

## 2019-03-04 NOTE — Therapy (Signed)
[place], [place], [address] Phone: [phone]   Fax:  [phone]  Physical Therapy Treatment  Patient Details  Name: Dustin Hamilton MRN: [id number] Date of Birth: [birth year] Referring Provider (PT): Benito Mccreedy MD   Encounter Date: 03/04/2019  PT End of Session - 03/04/19 0929    Visit Number  3    Number of Visits  16    Date for PT Re-Evaluation  04/20/19    Authorization Type  [employer]    PT Start Time  0918    PT Stop Time  1001    PT Time Calculation (min)  43 min    Activity Tolerance  Patient tolerated treatment well    Behavior During Therapy  [facility] for tasks assessed/performed       Past Medical History:  Diagnosis Date  . BPH (benign prostatic hyperplasia)   . Cerebrovascular disease   . Congenital anomaly of diaphragm   . Elevated PSA   . Glaucoma, both eyes   . Hemorrhoid   . Hepatitis B surface antigen positive    02-20-2011  . History of adenomatous polyp of colon    2007, 2009 and 2013  tubular adenoma's  . History of alcohol abuse    quit 1963  . History of cerebral parenchymal hemorrhage    01/ 2006  left occiptial lobe related to hypertensive crisis  . History of CVA (cerebrovascular accident)    09-12-2012  left hippocampus/ amygdala junction and per MRI old white matter infarcts--  per pt residual short- term memory issues  . History of fatty infiltration of liver hx visit's at [facility] , last visit 05/ 2014   elvated LFT's ,  via liver bx 2004 related to hx alcohol and drug abuse (quit 1964)  . 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 HTN (hypertension)   . Renal cyst, left   . Stroke ([place])    hx of 3 strokes in past   . 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 Urethral lesion    urethral mass    Past Surgical History:  Procedure Laterality Date  . CARDIOVASCULAR STRESS TEST   05/05/2007   normal nuclear study w/ no ischemia/  normal LV fucntion and wall motion , ef60%  . COLONOSCOPY  last one 04-06-2012  . CYSTO/  LEFT RETROGRADE PYELOGRAM/ CYTOLOGY WASHINGS/  URETEROSCOPY  03/05/2000  . INGUINAL HERNIA REPAIR Bilateral 1965 and 1980's  . LAPAROSCOPIC INGUINAL HERNIA WITH UMBILICAL HERNIA Right [id number]  . LIVER BIOPSY  1980's and 2004  . SVT ABLATION N/A 11/15/2018   Procedure: SVT ABLATION;  Surgeon: Evans Lance, MD;  Location: [facility];  Service: Cardiovascular;  Laterality: N/A;  . TRANSTHORACIC ECHOCARDIOGRAM  09/13/2012   moderate LVH,  ef 60-65%/    . TRANSURETHRAL RESECTION OF BLADDER TUMOR N/A 08/11/2016   Procedure: TRANSURETHRAL RESECTION OF BLADDER TUMOR (TURBT);  Surgeon: Cleon Gustin, MD;  Location: [facility];  Service: Urology;  Laterality: N/A;    There were no vitals filed for this visit.  Subjective Assessment - 03/04/19 0923    Subjective  Pt arriving to therapy today reporting 2-3/10 pain in his low back. Pt feels that he may have slept in an odd position because he woke up in pain. 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    Limitations  Walking;Standing;House hold activities    Currently in Pain?  Yes    Pain Score  3     Pain Location  Back    Pain Orientation  Lower;Right    Pain Descriptors / Indicators  Sore    Pain Type  Chronic pain    Pain Onset  More than a month ago                       [facility] Adult PT Treatment/Exercise - 03/04/19 0001      Exercises   Exercises  Lumbar      Lumbar Exercises: Stretches   Active Hamstring Stretch  Right;Left;2 reps;30 seconds    Single Knee to Chest Stretch  Right;Left;3 reps;20 seconds    Figure 4 Stretch  2 reps;20 seconds;Supine;With overpressure      Lumbar Exercises:  Aerobic   Nustep  L4 x 6 minutes      Lumbar Exercises: Supine   Clam  10 reps;5 seconds    Bridge  --    Bridge Limitations  2 reps, pt reporting increased pain    Straight Leg Raise  10 reps;2 seconds;Limitations    Straight Leg Raises Limitations  instructions to keep knee straight    Other Supine Lumbar Exercises  marching x 30, instructions to keep abs tight    Other Supine Lumbar Exercises  ball squeezes x 15 holding 5 seconds          Balance Exercises - 03/04/19 0938      Balance Exercises: Standing   Rockerboard  30 seconds;UE support    Step Ups  2 inch;UE support 2    Marching Limitations  20 times with CGA for balance        PT Education - 03/04/19 0929    Education Details  We discussed imporatnce of HEP and pt was able to verbalize his HEP    Person(s) Educated  Patient    Methods  Explanation    Comprehension  Verbalized understanding       PT Short Term Goals - 03/04/19 0936      PT SHORT TERM GOAL #1   Title  Pt will be indepdendent in his HEP.    Time  3    Period  Weeks    Status  On-going    Target Date  03/11/19      PT SHORT TERM GOAL #2   Title  Pt will tolerate 20 minutes of ther exercise with no rest breaks to improve endurance.    Time  4    Period  Weeks    Status  New    Target Date  03/18/19        PT Long Term Goals - 03/04/19 0936      PT LONG TERM GOAL #1   Title  Pt will be able to improve BERG balance score from 35/56 to >/= 43/56.    Baseline  35/56 on 02/18/2019    Time  8    Period  Weeks    Status  New      PT LONG TERM GOAL #2   Title  Pt will amb 15 minutes with no rest breaks with step through gait pattern with LRAD on level surfaces.    Baseline  pt reporting he  only walks short distances around his home and he becomes SOB.    Time  8    Period  Weeks    Status  New      PT LONG TERM GOAL #3   Title  Pt will improve his bilateral LE strength to >/= 4/5 in order to improve gait and functional mobility.     Baseline  see flow sheets    Time  8    Period  Weeks    Status  New            Plan - 03/04/19 0934    Clinical Impression Statement  Pt tolerating all exercises well. Still requiring CGA for balance  exercises. Continue skilled PT to progress toward goals set.    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    Examination-Activity Limitations  Bend;Transfers;Stairs;Stand;Sit    Examination-Participation Restrictions  Community Activity    Stability/Clinical Decision Making  Evolving/Moderate complexity    Rehab Potential  Fair    PT Frequency  2x / week    PT Duration  8 weeks    PT Treatment/Interventions  ADLs/Self Care Home Management;Moist Heat;Ultrasound;Stair training;Gait training;Functional mobility training;Therapeutic activities;Therapeutic exercise;Balance training;Neuromuscular re-education;Patient/family education;Manual techniques;Passive range of motion    PT Next Visit Plan  Nustep, LE strnegthening, balance exercises, hamstring stretching, core strengthening    PT Home Exercise Plan  Access Code: [email or web address]    Consulted and Agree with Plan of Care  Patient       Patient will benefit from skilled therapeutic intervention in order to improve the following deficits and impairments:  Abnormal gait, Pain, Postural dysfunction, Impaired flexibility, Decreased strength, Decreased activity tolerance, Decreased range of motion, Difficulty walking, Decreased balance, Cardiopulmonary status limiting activity  Visit Diagnosis: Chronic bilateral low back pain without sciatica  Difficulty in walking, not elsewhere classified  Muscle weakness (generalized)     Problem List Patient Active Problem List   Diagnosis Date Noted  . SVT (supraventricular tachycardia) ([place]) 06/03/2017  . Dizziness 08/02/2014  . 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 CVA (cerebral  infarction) 09/13/2012  . Vertigo 09/12/2012  . EAR PAIN 09/05/2009  . BACK PAIN 06/20/2009  . COLONIC POLYPS 05/06/2007  . 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 NONDEPENDENT ALCOHOL ABUSE IN REMISSION 05/06/2007  . HYPERTENSION, SEVERE 05/06/2007  . Hypertensive heart disease without heart failure 05/06/2007  . INTRACRANIAL HEMORRHAGE 05/06/2007  . Cerebrovascular disease 05/06/2007  . HEMORRHOIDS 05/06/2007  . INGUINAL HERNIA, RIGHT 05/06/2007  . RENAL CYST 05/06/2007  . HEMATURIA UNSPECIFIED 05/06/2007  . 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 PSA, INCREASED 05/06/2007  . LIVER FUNCTION TESTS, ABNORMAL 05/06/2007  . BPH (benign prostatic hypertrophy) with urinary obstruction 05/06/2007  . PERS HX NONCOMPLIANCE W/MED [facility] HAZARDS HLTH 05/06/2007    Oretha Caprice , PT 03/04/2019, 9:59 AM  [facility] [address] [place], [place], [address] Phone: [phone]   Fax:  [phone]  Name: JARRY MIHALEK MRN: [id number] Date of Birth: [birth year]

## 2019-03-08 ENCOUNTER — Encounter: Payer: Self-pay | Admitting: Physical Therapy

## 2019-03-08 ENCOUNTER — Other Ambulatory Visit: Payer: Self-pay

## 2019-03-08 ENCOUNTER — Ambulatory Visit: Payer: Medicare PPO | Admitting: Physical Therapy

## 2019-03-08 DIAGNOSIS — G8929 Other chronic pain: Secondary | ICD-10-CM

## 2019-03-08 DIAGNOSIS — M6281 Muscle weakness (generalized): Secondary | ICD-10-CM | POA: Diagnosis not present

## 2019-03-08 DIAGNOSIS — R262 Difficulty in walking, not elsewhere classified: Secondary | ICD-10-CM | POA: Diagnosis not present

## 2019-03-08 DIAGNOSIS — M545 Low back pain: Secondary | ICD-10-CM | POA: Diagnosis not present

## 2019-03-08 NOTE — Therapy (Signed)
[place], [place], [address] Phone: [phone]   Fax:  [phone]  Physical Therapy Treatment  Patient Details  Name: Dustin Hamilton MRN: [id number] Date of Birth: [birth year] Referring Provider (PT): Benito Mccreedy MD   Encounter Date: 03/08/2019  PT End of Session - 03/08/19 0941    Visit Number  4    Number of Visits  16    Date for PT Re-Evaluation  04/20/19    Authorization Type  [employer]    PT Start Time  0933    PT Stop Time  1012    PT Time Calculation (min)  39 min    Activity Tolerance  Patient tolerated treatment well    Behavior During Therapy  [facility] for tasks assessed/performed       Past Medical History:  Diagnosis Date  . BPH (benign prostatic hyperplasia)   . Cerebrovascular disease   . Congenital anomaly of diaphragm   . Elevated PSA   . Glaucoma, both eyes   . Hemorrhoid   . Hepatitis B surface antigen positive    02-20-2011  . History of adenomatous polyp of colon    2007, 2009 and 2013  tubular adenoma's  . History of alcohol abuse    quit 1963  . History of cerebral parenchymal hemorrhage    01/ 2006  left occiptial lobe related to hypertensive crisis  . History of CVA (cerebrovascular accident)    09-12-2012  left hippocampus/ amygdala junction and per MRI old white matter infarcts--  per pt residual short- term memory issues  . History of fatty infiltration of liver hx visit's at [facility] , last visit 05/ 2014   elvated LFT's ,  via liver bx 2004 related to hx alcohol and drug abuse (quit 1964)  . 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 HTN (hypertension)   . Renal cyst, left   . Stroke ([place])    hx of 3 strokes in past   . 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 Urethral lesion    urethral mass    Past Surgical History:  Procedure Laterality Date  . CARDIOVASCULAR STRESS TEST   05/05/2007   normal nuclear study w/ no ischemia/  normal LV fucntion and wall motion , ef60%  . COLONOSCOPY  last one 04-06-2012  . CYSTO/  LEFT RETROGRADE PYELOGRAM/ CYTOLOGY WASHINGS/  URETEROSCOPY  03/05/2000  . INGUINAL HERNIA REPAIR Bilateral 1965 and 1980's  . LAPAROSCOPIC INGUINAL HERNIA WITH UMBILICAL HERNIA Right [id number]  . LIVER BIOPSY  1980's and 2004  . SVT ABLATION N/A 11/15/2018   Procedure: SVT ABLATION;  Surgeon: Evans Lance, MD;  Location: [facility];  Service: Cardiovascular;  Laterality: N/A;  . TRANSTHORACIC ECHOCARDIOGRAM  09/13/2012   moderate LVH,  ef 60-65%/    . TRANSURETHRAL RESECTION OF BLADDER TUMOR N/A 08/11/2016   Procedure: TRANSURETHRAL RESECTION OF BLADDER TUMOR (TURBT);  Surgeon: Cleon Gustin, MD;  Location: [facility];  Service: Urology;  Laterality: N/A;    There were no vitals filed for this visit.  Subjective Assessment - 03/08/19 0938    Subjective  Something in my back is really hurting. It depends on how I sleep, I cannot figure out what it is.    Patient Stated Goals  Be able to move without pain and walk without pain    Currently in  Pain?  Yes    Pain Score  4     Pain Location  Back    Pain Orientation  Lower    Pain Descriptors / Indicators  Sore                       [facility] Adult PT Treatment/Exercise - 03/08/19 0001      Lumbar Exercises: Aerobic   Nustep  5 min L6 UE & LE      Lumbar Exercises: Standing   Wall Slides  10 reps;3 seconds    Wall Slides Limitations  mini wall slide with hold    Other Standing Lumbar Exercises  posture in mirror- hips back/Shoulders fwd    Other Standing Lumbar Exercises  marching SBA-cues for posture      Lumbar Exercises: Seated   LAQ on Chair Limitations  with arms crossed over chest      Lumbar Exercises: Supine   Straight Leg Raise  10 reps   both     Lumbar Exercises: Sidelying   Clam  Both;20 reps;10 reps      Manual Therapy   Manual  Therapy  Soft tissue mobilization    Soft tissue mobilization  IASTM Rt QL in sidelying             PT Education - 03/08/19 0940    Education Details  sleep posture & mattress    Person(s) Educated  Patient    Methods  Explanation    Comprehension  Verbalized understanding       PT Short Term Goals - 03/04/19 0936      PT SHORT TERM GOAL #1   Title  Pt will be indepdendent in his HEP.    Time  3    Period  Weeks    Status  On-going    Target Date  03/11/19      PT SHORT TERM GOAL #2   Title  Pt will tolerate 20 minutes of ther exercise with no rest breaks to improve endurance.    Time  4    Period  Weeks    Status  New    Target Date  03/18/19        PT Long Term Goals - 03/04/19 0936      PT LONG TERM GOAL #1   Title  Pt will be able to improve BERG balance score from 35/56 to >/= 43/56.    Baseline  35/56 on 02/18/2019    Time  8    Period  Weeks    Status  New      PT LONG TERM GOAL #2   Title  Pt will amb 15 minutes with no rest breaks with step through gait pattern with LRAD on level surfaces.    Baseline  pt reporting he only walks short distances around his home and he becomes SOB.    Time  8    Period  Weeks    Status  New      PT LONG TERM GOAL #3   Title  Pt will improve his bilateral LE strength to >/= 4/5 in order to improve gait and functional mobility.    Baseline  see flow sheets    Time  8    Period  Weeks    Status  New            Plan - 03/08/19 1013    Clinical Impression Statement  resting posture with hips anterior to  shoulders placing increased pressure through lumbar spine. Used mirror for [employer] for pt to correct. Used a pillow bw knees in sidelying and pt reported a significant difference.    PT Treatment/Interventions  ADLs/Self Care Home Management;Moist Heat;Ultrasound;Stair training;Gait training;Functional mobility training;Therapeutic activities;Therapeutic exercise;Balance training;Neuromuscular  re-education;Patient/family education;Manual techniques;Passive range of motion    PT Next Visit Plan  review balance, standing core-bands, continue balance    PT Home Exercise Plan  Access Code: [email or web address], SL clams, seated LAQ, standing posture    Consulted and Agree with Plan of Care  Patient       Patient will benefit from skilled therapeutic intervention in order to improve the following deficits and impairments:  Abnormal gait, Pain, Postural dysfunction, Impaired flexibility, Decreased strength, Decreased activity tolerance, Decreased range of motion, Difficulty walking, Decreased balance, Cardiopulmonary status limiting activity  Visit Diagnosis: Chronic bilateral low back pain without sciatica  Difficulty in walking, not elsewhere classified  Muscle weakness (generalized)     Problem List Patient Active Problem List   Diagnosis Date Noted  . SVT (supraventricular tachycardia) ([place]) 06/03/2017  . Dizziness 08/02/2014  . 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 CVA (cerebral infarction) 09/13/2012  . Vertigo 09/12/2012  . EAR PAIN 09/05/2009  . BACK PAIN 06/20/2009  . COLONIC POLYPS 05/06/2007  . 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 NONDEPENDENT ALCOHOL ABUSE IN REMISSION 05/06/2007  . HYPERTENSION, SEVERE 05/06/2007  . Hypertensive heart disease without heart failure 05/06/2007  . INTRACRANIAL HEMORRHAGE 05/06/2007  . Cerebrovascular disease 05/06/2007  . HEMORRHOIDS 05/06/2007  . INGUINAL HERNIA, RIGHT 05/06/2007  . RENAL CYST 05/06/2007  . HEMATURIA UNSPECIFIED 05/06/2007  . 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 PSA, INCREASED 05/06/2007  . LIVER FUNCTION TESTS, ABNORMAL 05/06/2007  . BPH (benign prostatic hypertrophy) with urinary obstruction 05/06/2007  . PERS HX NONCOMPLIANCE W/MED TX PRS HAZARDS HLTH 05/06/2007   Raelyn Racette C. Loel Betancur PT, DPT 03/08/19 10:15 AM   [facility] [address] [place], [place], [address] Phone: [phone]   Fax:  [phone]  Name: Dustin Hamilton MRN: [id number] Date of Birth: 04/29/1941

## 2019-03-10 ENCOUNTER — Ambulatory Visit: Payer: Medicare PPO | Admitting: Physical Therapy

## 2019-03-10 ENCOUNTER — Encounter: Payer: Self-pay | Admitting: Physical Therapy

## 2019-03-10 ENCOUNTER — Other Ambulatory Visit: Payer: Self-pay

## 2019-03-10 VITALS — BP 139/87 | HR 63

## 2019-03-10 DIAGNOSIS — G8929 Other chronic pain: Secondary | ICD-10-CM | POA: Diagnosis not present

## 2019-03-10 DIAGNOSIS — R262 Difficulty in walking, not elsewhere classified: Secondary | ICD-10-CM

## 2019-03-10 DIAGNOSIS — M545 Low back pain, unspecified: Secondary | ICD-10-CM

## 2019-03-10 DIAGNOSIS — M6281 Muscle weakness (generalized): Secondary | ICD-10-CM | POA: Diagnosis not present

## 2019-03-10 NOTE — Therapy (Signed)
[place], [place], [address] Phone: [phone]   Fax:  [phone]  Physical Therapy Treatment  Patient Details  Name: Dustin Hamilton MRN: [id number] Date of Birth: 01/13/1941 Referring Provider (PT): Benito Mccreedy MD   Encounter Date: 03/10/2019  PT End of Session - 03/10/19 0939    Visit Number  5    Number of Visits  16    Date for PT Re-Evaluation  04/20/19    Authorization Type  [employer]    PT Start Time  0930    PT Stop Time  1000    PT Time Calculation (min)  30 min    Activity Tolerance  Patient tolerated treatment well    Behavior During Therapy  [facility] for tasks assessed/performed       Past Medical History:  Diagnosis Date  . BPH (benign prostatic hyperplasia)   . Cerebrovascular disease   . Congenital anomaly of diaphragm   . Elevated PSA   . Glaucoma, both eyes   . Hemorrhoid   . Hepatitis B surface antigen positive    02-20-2011  . History of adenomatous polyp of colon    2007, 2009 and 2013  tubular adenoma's  . History of alcohol abuse    quit 1963  . History of cerebral parenchymal hemorrhage    01/ 2006  left occiptial lobe related to hypertensive crisis  . History of CVA (cerebrovascular accident)    09-12-2012  left hippocampus/ amygdala junction and per MRI old white matter infarcts--  per pt residual short- term memory issues  . History of fatty infiltration of liver hx visit's at [facility] , last visit 05/ 2014   elvated LFT's ,  via liver bx 2004 related to hx alcohol and drug abuse (quit 1964)  . 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 HTN (hypertension)   . Renal cyst, left   . Stroke ([place])    hx of 3 strokes in past   . 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 Urethral lesion    urethral mass    Past Surgical History:  Procedure Laterality Date  . CARDIOVASCULAR STRESS TEST   05/05/2007   normal nuclear study w/ no ischemia/  normal LV fucntion and wall motion , ef60%  . COLONOSCOPY  last one 04-06-2012  . CYSTO/  LEFT RETROGRADE PYELOGRAM/ CYTOLOGY WASHINGS/  URETEROSCOPY  03/05/2000  . INGUINAL HERNIA REPAIR Bilateral 1965 and 1980's  . LAPAROSCOPIC INGUINAL HERNIA WITH UMBILICAL HERNIA Right [id number]  . LIVER BIOPSY  1980's and 2004  . SVT ABLATION N/A 11/15/2018   Procedure: SVT ABLATION;  Surgeon: Evans Lance, MD;  Location: [facility];  Service: Cardiovascular;  Laterality: N/A;  . TRANSTHORACIC ECHOCARDIOGRAM  09/13/2012   moderate LVH,  ef 60-65%/    . TRANSURETHRAL RESECTION OF BLADDER TUMOR N/A 08/11/2016   Procedure: TRANSURETHRAL RESECTION OF BLADDER TUMOR (TURBT);  Surgeon: Cleon Gustin, MD;  Location: [facility];  Service: Urology;  Laterality: N/A;    Vitals:   03/10/19 0953  BP: 139/87  Pulse: 63    Subjective Assessment - 03/10/19 0938    Subjective  The pillow really helped, I don't know why it hurts so bad right now.    Currently in Pain?  Yes  [place] Adult PT Treatment/Exercise - 03/10/19 0001      Lumbar Exercises: Stretches   Other Lumbar Stretch Exercise  LTR & SKTC      Modalities   Modalities  Moist Heat      Moist Heat Therapy   Number Minutes Moist Heat  10 Minutes   concurrent with education & monitoring for change in dizzine   Moist Heat Location  Lumbar Spine      Manual Therapy   Soft tissue mobilization  STM Rt QL & lumbar paraspinals             PT Education - 03/10/19 1057    Education Details  how posture affects his back, dizziness-BP and importance of eating prior to exercise    Person(s) Educated  Patient    Methods  Explanation    Comprehension  Verbalized understanding       PT Short Term Goals - 03/04/19 0936      PT SHORT TERM GOAL #1   Title  Pt will be indepdendent in his HEP.    Time  3    Period  Weeks     Status  On-going    Target Date  03/11/19      PT SHORT TERM GOAL #2   Title  Pt will tolerate 20 minutes of ther exercise with no rest breaks to improve endurance.    Time  4    Period  Weeks    Status  New    Target Date  03/18/19        PT Long Term Goals - 03/04/19 0936      PT LONG TERM GOAL #1   Title  Pt will be able to improve BERG balance score from 35/56 to >/= 43/56.    Baseline  35/56 on 02/18/2019    Time  8    Period  Weeks    Status  New      PT LONG TERM GOAL #2   Title  Pt will amb 15 minutes with no rest breaks with step through gait pattern with LRAD on level surfaces.    Baseline  pt reporting he only walks short distances around his home and he becomes SOB.    Time  8    Period  Weeks    Status  New      PT LONG TERM GOAL #3   Title  Pt will improve his bilateral LE strength to >/= 4/5 in order to improve gait and functional mobility.    Baseline  see flow sheets    Time  8    Period  Weeks    Status  New            Plan - 03/10/19 0939    Clinical Impression Statement  Significant reduction in pain following LTR and SKTC at beginning of session and he will do these before getting out of bed in the AM. Very dizzy upon standing, reports he has had an energy drink this morning but no water or food. BP 139/87 HR 63 BMP- has taken BP meds today. Since dizziness was unresolved, it was not appropriate to continue with exercise today. Asked pt to eat breakfast prior to coming to next appointment.    PT Treatment/Interventions  ADLs/Self Care Home Management;Moist Heat;Ultrasound;Stair training;Gait training;Functional mobility training;Therapeutic activities;Therapeutic exercise;Balance training;Neuromuscular re-education;Patient/family education;Manual techniques;Passive range of motion    PT Next Visit Plan  review balance, standing core-bands, continue balance, did he eat before appointment?  PT Home Exercise Plan  Access Code: [email or web address], SL clams,  seated LAQ, standing posture    Consulted and Agree with Plan of Care  Patient       Patient will benefit from skilled therapeutic intervention in order to improve the following deficits and impairments:  Abnormal gait, Pain, Postural dysfunction, Impaired flexibility, Decreased strength, Decreased activity tolerance, Decreased range of motion, Difficulty walking, Decreased balance, Cardiopulmonary status limiting activity  Visit Diagnosis: Chronic bilateral low back pain without sciatica  Difficulty in walking, not elsewhere classified  Muscle weakness (generalized)     Problem List Patient Active Problem List   Diagnosis Date Noted  . SVT (supraventricular tachycardia) ([place]) 06/03/2017  . Dizziness 08/02/2014  . 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 CVA (cerebral infarction) 09/13/2012  . Vertigo 09/12/2012  . EAR PAIN 09/05/2009  . BACK PAIN 06/20/2009  . COLONIC POLYPS 05/06/2007  . 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 NONDEPENDENT ALCOHOL ABUSE IN REMISSION 05/06/2007  . HYPERTENSION, SEVERE 05/06/2007  . Hypertensive heart disease without heart failure 05/06/2007  . INTRACRANIAL HEMORRHAGE 05/06/2007  . Cerebrovascular disease 05/06/2007  . HEMORRHOIDS 05/06/2007  . INGUINAL HERNIA, RIGHT 05/06/2007  . RENAL CYST 05/06/2007  . HEMATURIA UNSPECIFIED 05/06/2007  . 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 PSA, INCREASED 05/06/2007  . LIVER FUNCTION TESTS, ABNORMAL 05/06/2007  . BPH (benign prostatic hypertrophy) with urinary obstruction 05/06/2007  . PERS HX NONCOMPLIANCE W/MED TX PRS HAZARDS HLTH 05/06/2007    Aimi Essner C. Rejeana Fadness PT, DPT 03/10/19 11:00 AM   [place], [place], [address] Phone: [phone]   Fax:  [phone]  Name: EGBERT BERNALES MRN: [id number] Date of Birth: 06-23-1940

## 2019-03-14 ENCOUNTER — Encounter: Payer: Self-pay | Admitting: Diagnostic Neuroimaging

## 2019-03-14 ENCOUNTER — Other Ambulatory Visit: Payer: Self-pay

## 2019-03-14 ENCOUNTER — Ambulatory Visit (INDEPENDENT_AMBULATORY_CARE_PROVIDER_SITE_OTHER): Payer: Medicare PPO | Admitting: Diagnostic Neuroimaging

## 2019-03-14 VITALS — BP 130/85 | HR 58 | Temp 97.8°F | Ht 71.0 in | Wt 169.4 lb

## 2019-03-14 DIAGNOSIS — I639 Cerebral infarction, unspecified: Secondary | ICD-10-CM

## 2019-03-14 DIAGNOSIS — F0390 Unspecified dementia without behavioral disturbance: Secondary | ICD-10-CM | POA: Diagnosis not present

## 2019-03-14 DIAGNOSIS — F03A0 Unspecified dementia, mild, without behavioral disturbance, psychotic disturbance, mood disturbance, and anxiety: Secondary | ICD-10-CM

## 2019-03-14 NOTE — Progress Notes (Signed)
Office note faxed to [place], Dr Theda Sers per patient's request, f [phone]

## 2019-03-14 NOTE — Progress Notes (Signed)
[facility]  PATIENT: Dustin Hamilton DOB: 09-10-1940  REFERRING CLINICIAN: Kirke Corin, MD HISTORY FROM: patient and wife  REASON FOR VISIT: FOLLOW UP    HISTORICAL  CHIEF COMPLAINT:  Chief Complaint  Patient presents with  . Cerebrovascular Accident    rm 6, 6 month FU, wifeMariann Hamilton MMSE 28, Prescribed Aricept, Namenda by [place] but not taking"  . Memory Loss    HISTORY OF PRESENT ILLNESS:   UPDATE (03/14/19, VRP): Since last visit, doing about the same. Symptoms are stable. Severity is moderate. No alleviating or aggravating factors. Tolerating meds. Memory stable. Balance is poor.   UPDATE (09/06/18, VRP): Since last visit, doing well until 1 weeks ago; patient woke up with new onset of slurred speech, left arm weakness, left arm numbness, trouble walking.  Wife called EMS for evaluation.  By the time they arrived symptoms were starting to improve.  He was able to stand and walk.  His blood pressure was 150s over 80s.  He did not have any fever.  Due to current virus pandemic, mild symptoms, rapidly improving symptoms, apparently EMS told patient that it would be safer if he stays at home, although I cannot verify this information.  Since that time symptoms have significantly improved.  His slurred speech has improved.  He still has weakness and numbness of his left arm.  His balance is unsteady but slightly improving.  He is tolerating his medications.  Memory stable.  PRIOR HPI 78 year old male here for evaluation of memory loss.  Patient has had at least 3 to 4 years of gradual onset progressive short-term memory loss, confusion, cognitive decline.  He previously diagnosed with vascular dementia.  Past 2 years patient has had intermittent staring spells, zoning out spells, 3-4 times per month.  Sometimes associated with lightheadedness, dizziness and presyncope symptoms.  One time patient went to the hospital for evaluation and was found to have hypotension.  His  blood pressure medicines have been adjusted and blood pressure has been improved since August 2019 and patient has not had any of these staring spells since that time.    REVIEW OF SYSTEMS: Full 14 system review of systems performed and negative with exception of: as per HPI.    ALLERGIES: Allergies  Allergen Reactions  . Penicillins Hives    Has patient had a PCN reaction causing immediate rash, facial/tongue/throat swelling, SOB or lightheadedness with hypotension: Yes Has patient had a PCN reaction causing severe rash involving mucus membranes or skin necrosis: Yes Has patient had a PCN reaction that required hospitalization No Has patient had a PCN reaction occurring within the last 10 years: No If all of the above answers are "NO", then may proceed with Cephalosporin use.     HOME MEDICATIONS: Outpatient Medications Prior to Visit  Medication Sig Dispense Refill  . amLODipine (NORVASC) 5 MG tablet Take 5 mg by mouth daily.    Marland Kitchen aspirin EC 81 MG tablet Take 81 mg by mouth daily.    . brimonidine (ALPHAGAN) 0.2 % ophthalmic solution Place 1 drop into both eyes 2 (two) times daily.    . carvedilol (COREG) 25 MG tablet Take 25 mg by mouth 2 (two) times a day.     . cetirizine (ZYRTEC) 5 MG chewable tablet Chew 1 tablet (5 mg total) by mouth daily. 20 tablet 0  . clopidogrel (PLAVIX) 75 MG tablet Take 1 tablet (75 mg total) by mouth daily with breakfast. 30 tablet 11  . finasteride (PROSCAR) 5  MG tablet Take 5 mg by mouth daily.     Marland Kitchen latanoprost (XALATAN) 0.005 % ophthalmic solution Place 1 drop into both eyes at bedtime.     . tamsulosin (FLOMAX) 0.4 MG CAPS capsule Take 0.4 mg by mouth at bedtime.     . donepezil (ARICEPT) 5 MG tablet Take 5 mg by mouth at bedtime.    . memantine (NAMENDA) 5 MG tablet Take 5 mg by mouth 2 (two) times daily.     No facility-administered medications prior to visit.     PAST MEDICAL HISTORY: Past Medical History:  Diagnosis Date  . BPH (benign  prostatic hyperplasia)   . Cerebrovascular disease   . Congenital anomaly of diaphragm   . Elevated PSA   . Glaucoma, both eyes   . Hemorrhoid   . Hepatitis B surface antigen positive    02-20-2011  . History of adenomatous polyp of colon    2007, 2009 and 2013  tubular adenoma's  . History of alcohol abuse    quit 1963  . History of cerebral parenchymal hemorrhage    01/ 2006  left occiptial lobe related to hypertensive crisis  . History of CVA (cerebrovascular accident)    09-12-2012  left hippocampus/ amygdala junction and per MRI old white matter infarcts--  per pt residual short- term memory issues  . History of fatty infiltration of liver hx visit's at [facility] , last visit 05/ 2014   elvated LFT's ,  via liver bx 2004 related to hx alcohol and drug abuse (quit 1964)  . 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 HTN (hypertension)   . Renal cyst, left   . Stroke ([place])    hx of 3 strokes in past   . 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 Urethral lesion    urethral mass    PAST SURGICAL HISTORY: Past Surgical History:  Procedure Laterality Date  . CARDIOVASCULAR STRESS TEST  05/05/2007   normal nuclear study w/ no ischemia/  normal LV fucntion and wall motion , ef60%  . COLONOSCOPY  last one 04-06-2012  . CYSTO/  LEFT RETROGRADE PYELOGRAM/ CYTOLOGY WASHINGS/  URETEROSCOPY  03/05/2000  . INGUINAL HERNIA REPAIR Bilateral 1965 and 1980's  . LAPAROSCOPIC INGUINAL HERNIA WITH UMBILICAL HERNIA Right [id number]  . LIVER BIOPSY  1980's and 2004  . SVT ABLATION N/A 11/15/2018   Procedure: SVT ABLATION;  Surgeon: Evans Lance, MD;  Location: [facility];  Service: Cardiovascular;  Laterality: N/A;  . TRANSTHORACIC ECHOCARDIOGRAM  09/13/2012   moderate LVH,  ef 60-65%/    . TRANSURETHRAL RESECTION OF BLADDER TUMOR N/A 08/11/2016   Procedure: TRANSURETHRAL RESECTION OF BLADDER TUMOR (TURBT);  Surgeon:  Cleon Gustin, MD;  Location: [facility];  Service: Urology;  Laterality: N/A;    FAMILY HISTORY: Family History  Problem Relation Age of Onset  . Rheum arthritis Mother   . Diabetes Mother   . Stroke Mother   . Heart attack Mother   . Kidney failure Mother   . Heart attack Father   . Heart disease Maternal Grandmother   . Rheum arthritis Maternal Grandmother   . Diabetes Maternal Grandmother   . Stroke Maternal Grandmother   . Colon cancer Neg Hx     SOCIAL HISTORY: Social History   Socioeconomic History  . Marital status: Married    Spouse name: Dustin Hamilton  . Number  of children: 1  . Years of education: College  . Highest education level: Not on file  Occupational History  . Occupation: Geophysicist/field seismologist  Social Needs  . Financial resource strain: Not on file  . Food insecurity    Worry: Not on file    Inability: Not on file  . Transportation needs    Medical: Not on file    Non-medical: Not on file  Tobacco Use  . Smoking status: Former Smoker    Packs/day: 1.00    Years: 5.00    Pack years: 5.00    Types: Cigarettes    Quit date: 11/30/1981    Years since quitting: 37.3  . Smokeless tobacco: Never Used  Substance and Sexual Activity  . Alcohol use: No    Alcohol/week: 0.0 standard drinks    Comment: hx abuse -- quit:  1963  . 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 Sexual activity: Not on file  Lifestyle  . Physical activity    Days per week: Not on file    Minutes per session: Not on file  . Stress: Not on file  Relationships  . Social Herbalist on phone: Not on file    Gets together: Not on file    Attends religious service: Not on file    Active member of club or organization: Not on file    Attends meetings of clubs or organizations: Not on file    Relationship status: Not on file  . Intimate partner violence    Fear of current or ex partner: Not on file    Emotionally abused: Not on file     Physically abused: Not on file    Forced sexual activity: Not on file  Other Topics Concern  . Not on file  Social History Narrative   Patient lives at home with his spouse.   Caffeine Use:  Tea, lots     PHYSICAL EXAM  GENERAL EXAM/CONSTITUTIONAL: Vitals:  Vitals:   03/14/19 1358  BP: (!) 148/92  Pulse: (!) 57  Temp: 97.8 F (36.6 C)  Weight: 169 lb 6.4 oz (76.8 kg)  Height: 5\' 11"  (1.803 m)     Body mass index is 23.63 kg/m. Wt Readings from Last 3 Encounters:  03/14/19 169 lb 6.4 oz (76.8 kg)  02/24/19 168 lb (76.2 kg)  11/23/18 170 lb (77.1 kg)     Patient is in no distress; well developed, nourished and groomed; neck is supple  CARDIOVASCULAR:  Examination of carotid arteries is normal; no carotid bruits  Regular rate and rhythm, no murmurs  Examination of peripheral vascular system by observation and palpation is normal  EYES:  Ophthalmoscopic exam of optic discs and posterior segments is normal; no papilledema or hemorrhages  No exam data present  MUSCULOSKELETAL:  Gait, strength, tone, movements noted in Neurologic exam below  NEUROLOGIC: MENTAL STATUS:  MMSE - [place] Exam 03/14/2019 03/15/2018 12/26/2013  Orientation to time 5 4 5   Orientation to Place 4 4 5   Registration 3 3 3   Attention/ Calculation 4 2 3   Recall 3 3 3   Language- name 2 objects 2 2 2   Language- repeat 1 1 1   Language- follow 3 step command 3 3 3   Language- read & follow direction 1 1 1   Write a sentence 1 1 1   Copy design 1 0 1  Total score 28 24 28     awake, alert,  oriented to person, place and time  recent and remote memory intact  normal attention and concentration  language fluent, comprehension intact, naming intact  fund of knowledge appropriate  CRANIAL NERVE:   2nd - no papilledema on fundoscopic exam  2nd, 3rd, 4th, 6th - pupils equal and reactive to light, visual fields full to confrontation, extraocular muscles intact, no  nystagmus  5th - facial sensation symmetric  7th - facial strength symmetric  8th - hearing intact  9th - palate elevates symmetrically, uvula midline  11th - shoulder shrug symmetric  12th - tongue protrusion midline  MOTOR:   normal bulk and tone, full strength in the BUE, BLE  SENSORY:   normal and symmetric to light touch, pinprick, temperature, vibration  COORDINATION:   finger-nose-finger, fine finger movements normal  REFLEXES:   deep tendon reflexes present and symmetric  GAIT/STATION:   narrow based gait; able to walk on toes, heels and tandem; romberg is negative      DIAGNOSTIC DATA (LABS, IMAGING, TESTING) - I reviewed patient records, labs, notes, testing and imaging myself where available.  Lab Results  Component Value Date   WBC 4.8 11/12/2018   HGB 17.2 11/12/2018   HCT 52.0 (H) 11/12/2018   MCV 92 11/12/2018   PLT 144 (L) 11/12/2018      Component Value Date/Time   NA 143 11/12/2018 0950   K 3.7 11/12/2018 0950   CL 105 11/12/2018 0950   CO2 24 11/12/2018 0950   GLUCOSE 135 (H) 11/12/2018 0950   GLUCOSE 109 (H) 10/14/2018 1240   BUN 15 11/12/2018 0950   CREATININE 0.78 11/12/2018 0950   CALCIUM 9.9 11/12/2018 0950   PROT 6.8 10/14/2018 1240   ALBUMIN 3.7 10/14/2018 1240   AST 29 10/14/2018 1240   ALT 30 10/14/2018 1240   ALKPHOS 53 10/14/2018 1240   BILITOT 0.7 10/14/2018 1240   GFRNONAA 86 11/12/2018 0950   GFRAA 100 11/12/2018 0950   Lab Results  Component Value Date   CHOL 123 09/13/2012   HDL 25 (L) 09/13/2012   LDLCALC 76 09/13/2012   TRIG 112 09/13/2012   CHOLHDL 4.9 09/13/2012   Lab Results  Component Value Date   HGBA1C 6.2 (H) 09/13/2012   Lab Results  Component Value Date   [id number] 06/28/2013   Lab Results  Component Value Date   TSH 1.230 06/28/2013    10/16/16 MRI HEAD [I reviewed images myself and agree with interpretation. Moderate ventriculomegaly and atrophy.  -VRP]  1. No acute  intracranial infarct or other process identified. 2. Innumerable chronic micro hemorrhages scattered throughout the brain, most likely related to chronic underlying hypertension given patient history. 3. Stable atrophy with moderate chronic microvascular ischemic disease.  10/16/16 MRA HEAD [I reviewed images myself and agree with interpretation. -VRP]  Negative intracranial MRA. No large or proximal arterial branch occlusion. No high-grade or correctable stenosis.  10/16/16 MRA NECK [I reviewed images myself and agree with interpretation. -VRP]  1. Negative MRA of the neck. No hemodynamically significant or critical stenosis identified within the neck.  2. 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  01/23/17 MRI brain - No acute treatment pathology identified, specifically no acute infarct. - Remote lacunar infarcts in the bilateral frontal lobe centrum semiovale. - Moderate chronic small vessel ischemic change. - Multiple scattered foci of susceptibility most abundant within the peripheral cerebrum and cerebellum which may reflect amyloid angiopathy pattern.   07/08/17 CT head [I reviewed images myself and agree  with interpretation. -VRP]  1. No CT evidence for acute intracranial abnormality. 2. Atrophy and small vessel ischemic changes of the white matter.  06/09/17 TTE - Left ventricle: The cavity size was normal. Systolic function was   normal. The estimated ejection fraction was in the range of 60%   to 65%. Wall motion was normal; there were no regional wall   motion abnormalities. There was an increased relative   contribution of atrial contraction to ventricular filling.   Doppler parameters are consistent with abnormal left ventricular   relaxation (grade 1 diastolic dysfunction). Doppler parameters   are consistent with high ventricular filling pressure. - Mitral valve: There was trivial regurgitation. - Pulmonary arteries: Systolic pressure could  not be accurately   estimated.  09/07/18 carotid u/s Right Carotid: There was no evidence of thrombus, dissection, atherosclerotic                plaque or stenosis in the cervical carotid system. Left Carotid: There is no evidence of stenosis in the left ICA. The extracranial               vessels were near-normal with only minimal wall thickening or               plaque. Vertebrals:  Bilateral vertebral arteries demonstrate antegrade flow. Subclavians: Normal flow hemodynamics were seen in bilateral subclavian              arteries.  09/24/18 MRI brain - Acute nonhemorrhagic white matter infarct, RIGHT centrum semiovale. - Atrophy and small vessel disease.  11/15/18 EP study 1. Sinus rhythm upon presentation.  2. 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 Successful radiofrequency modification of the slow AV nodal pathway  4. No inducible arrhythmias following ablation.  5. No early apparent complications.   ASSESSMENT AND PLAN  78 y.o. year old male here with gradual onset progressive short-term memory loss confusion and decline in ADLs, consistent with mild dementia.  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  Abnormal spells of staring and zoning out are likely presyncope and hypotension related.  Complex partial seizures are possible but symptoms seem to have improved since blood pressure medicines have been adjusted.   MMSE 24/30 ADL 6/6 IADL 7/8  Dx:  1. Mild dementia ([place])   2. 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    Penni Bombard, MD [id number], [id number] PM Certified in Neurology, Neurophysiology and Neuroimaging  [facility] [address] [place], [place] [address] [phone]

## 2019-03-15 ENCOUNTER — Ambulatory Visit: Payer: Medicare PPO | Admitting: Physical Therapy

## 2019-03-15 ENCOUNTER — Encounter: Payer: Self-pay | Admitting: Physical Therapy

## 2019-03-15 ENCOUNTER — Other Ambulatory Visit: Payer: Self-pay

## 2019-03-15 DIAGNOSIS — M6281 Muscle weakness (generalized): Secondary | ICD-10-CM

## 2019-03-15 DIAGNOSIS — G8929 Other chronic pain: Secondary | ICD-10-CM | POA: Diagnosis not present

## 2019-03-15 DIAGNOSIS — M545 Low back pain: Secondary | ICD-10-CM | POA: Diagnosis not present

## 2019-03-15 DIAGNOSIS — R262 Difficulty in walking, not elsewhere classified: Secondary | ICD-10-CM | POA: Diagnosis not present

## 2019-03-15 NOTE — Therapy (Signed)
[facility], [place], [address] Phone: [phone]   Fax:  [phone]  Physical Therapy Treatment  Patient Details  Name: Dustin Hamilton MRN: [id number] Date of Birth: 1941/06/01 Referring Provider (PT): Benito Mccreedy MD   Encounter Date: 03/15/2019  PT End of Session - 03/15/19 0936    Visit Number  6    Number of Visits  16    Date for PT Re-Evaluation  04/20/19    Authorization Type  [employer]    PT Start Time  [id number]    PT Stop Time  1015    PT Time Calculation (min)  42 min       Past Medical History:  Diagnosis Date  . BPH (benign prostatic hyperplasia)   . Cerebrovascular disease   . Congenital anomaly of diaphragm   . Elevated PSA   . Glaucoma, both eyes   . Hemorrhoid   . Hepatitis B surface antigen positive    02-20-2011  . History of adenomatous polyp of colon    2007, 2009 and 2013  tubular adenoma's  . History of alcohol abuse    quit 1963  . History of cerebral parenchymal hemorrhage    01/ 2006  left occiptial lobe related to hypertensive crisis  . History of CVA (cerebrovascular accident)    09-12-2012  left hippocampus/ amygdala junction and per MRI old white matter infarcts--  per pt residual short- term memory issues  . History of fatty infiltration of liver hx visit's at [facility] , last visit 05/ 2014   elvated LFT's ,  via liver bx 2004 related to hx alcohol and drug abuse (quit 1964)  . 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 HTN (hypertension)   . Renal cyst, left   . Stroke ([place])    hx of 3 strokes in past   . 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 Urethral lesion    urethral mass    Past Surgical History:  Procedure Laterality Date  . CARDIOVASCULAR STRESS TEST  05/05/2007   normal nuclear study w/ no ischemia/  normal LV fucntion and wall motion , ef60%  . COLONOSCOPY  last one  04-06-2012  . CYSTO/  LEFT RETROGRADE PYELOGRAM/ CYTOLOGY WASHINGS/  URETEROSCOPY  03/05/2000  . INGUINAL HERNIA REPAIR Bilateral 1965 and 1980's  . LAPAROSCOPIC INGUINAL HERNIA WITH UMBILICAL HERNIA Right [id number]  . LIVER BIOPSY  1980's and 2004  . SVT ABLATION N/A 11/15/2018   Procedure: SVT ABLATION;  Surgeon: Evans Lance, MD;  Location: [facility];  Service: Cardiovascular;  Laterality: N/A;  . TRANSTHORACIC ECHOCARDIOGRAM  09/13/2012   moderate LVH,  ef 60-65%/    . TRANSURETHRAL RESECTION OF BLADDER TUMOR N/A 08/11/2016   Procedure: TRANSURETHRAL RESECTION OF BLADDER TUMOR (TURBT);  Surgeon: Cleon Gustin, MD;  [facility];  Service: Urology;  Laterality: N/A;    There were no vitals filed for this visit.  Subjective Assessment - 03/15/19 0937    Subjective  The pain is not as bad as it was the last session.    Pain Score  3     Pain Location  Back    Pain Orientation  Lower    Pain Descriptors / Indicators  Sore    Pain Type  Chronic pain    Aggravating Factors   sitting or standing prolonged  Pain Relieving Factors  changing positions                       [facility] Adult PT Treatment/Exercise - 03/15/19 0001      Lumbar Exercises: Stretches   Other Lumbar Stretch Exercise  LTR & SKTC      Lumbar Exercises: Aerobic   Nustep  L3 x 5 minutes UE/LE       Lumbar Exercises: Seated   Sit to Stand  10 reps   without UE   Sit to Stand Limitations  plus 5 reps without UE       Lumbar Exercises: Supine   Bridge  20 reps    Straight Leg Raise  10 reps   both   Straight Leg Raises Limitations  2 sets       Lumbar Exercises: Sidelying   Hip Abduction  10 reps    Hip Abduction Limitations  2 sets each           Balance Exercises - 03/15/19 1000      Balance Exercises: Standing   Standing Eyes Opened  Narrow base of support (BOS);Head turns;Foam/compliant surface;3 reps    Standing Eyes Closed  Narrow base of  support (BOS);Wide (BOA);Foam/compliant surface;3 reps;20 secs;Limitations   CGA, pt with lateral lean to left   Tandem Stance  Eyes open;10 secs    SLS  Eyes open;Intermittent upper extremity support    Tandem Gait  Forward;Intermittent upper extremity support    Sidestepping  2 reps          PT Short Term Goals - 03/15/19 1010      PT SHORT TERM GOAL #1   Title  Pt will be indepdendent in his HEP.    Baseline  initial HEP began on 02/18/2019    Time  3    Period  Weeks    Status  Achieved      PT SHORT TERM GOAL #2   Title  Pt will tolerate 20 minutes of ther exercise with no rest breaks to improve endurance.    Time  4    Period  Weeks    Status  Achieved        PT Long Term Goals - 03/04/19 0936      PT LONG TERM GOAL #1   Title  Pt will be able to improve BERG balance score from 35/56 to >/= 43/56.    Baseline  35/56 on 02/18/2019    Time  8    Period  Weeks    Status  New      PT LONG TERM GOAL #2   Title  Pt will amb 15 minutes with no rest breaks with step through gait pattern with LRAD on level surfaces.    Baseline  pt reporting he only walks short distances around his home and he becomes SOB.    Time  8    Period  Weeks    Status  New      PT LONG TERM GOAL #3   Title  Pt will improve his bilateral LE strength to >/= 4/5 in order to improve gait and functional mobility.    Baseline  see flow sheets    Time  8    Period  Weeks    Status  New            Plan - 03/15/19 1008    Clinical Impression Statement  Pt arrives reporting less pain. He is  able to tolerate closed chain balance and strengthening today for 15 minutes in parallel bars without Rest break. Including the Nustep, he was active for 20 minutes without Rest break whch meets STG#2.    PT Next Visit Plan  review balance, standing core-bands, continue balance, did he eat before appointment?    PT Home Exercise Plan  Access Code: [email or web address], SL clams, seated LAQ, standing posture        Patient will benefit from skilled therapeutic intervention in order to improve the following deficits and impairments:  Abnormal gait, Pain, Postural dysfunction, Impaired flexibility, Decreased strength, Decreased activity tolerance, Decreased range of motion, Difficulty walking, Decreased balance, Cardiopulmonary status limiting activity  Visit Diagnosis: Chronic bilateral low back pain without sciatica  Difficulty in walking, not elsewhere classified  Muscle weakness (generalized)     Problem List Patient Active Problem List   Diagnosis Date Noted  . SVT (supraventricular tachycardia) ([place]) 06/03/2017  . Dizziness 08/02/2014  . 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 CVA (cerebral infarction) 09/13/2012  . Vertigo 09/12/2012  . EAR PAIN 09/05/2009  . BACK PAIN 06/20/2009  . COLONIC POLYPS 05/06/2007  . 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 NONDEPENDENT ALCOHOL ABUSE IN REMISSION 05/06/2007  . HYPERTENSION, SEVERE 05/06/2007  . Hypertensive heart disease without heart failure 05/06/2007  . INTRACRANIAL HEMORRHAGE 05/06/2007  . Cerebrovascular disease 05/06/2007  . HEMORRHOIDS 05/06/2007  . INGUINAL HERNIA, RIGHT 05/06/2007  . RENAL CYST 05/06/2007  . HEMATURIA UNSPECIFIED 05/06/2007  . 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 PSA, INCREASED 05/06/2007  . LIVER FUNCTION TESTS, ABNORMAL 05/06/2007  . BPH (benign prostatic hypertrophy) with urinary obstruction 05/06/2007  . PERS HX NONCOMPLIANCE W/MED [facility] HAZARDS HLTH 05/06/2007    Dorene Ar, PTA 03/15/2019, 10:21 AM  [facility] [address] [place], [place], [address] Phone: [phone]   Fax:  [phone]  Name: Dustin Hamilton MRN: [id number] Date of Birth: June 22, 1940

## 2019-03-17 ENCOUNTER — Encounter: Payer: Self-pay | Admitting: Physical Therapy

## 2019-03-17 ENCOUNTER — Other Ambulatory Visit: Payer: Self-pay

## 2019-03-17 ENCOUNTER — Ambulatory Visit: Payer: Medicare PPO | Admitting: Physical Therapy

## 2019-03-17 DIAGNOSIS — M545 Low back pain: Secondary | ICD-10-CM | POA: Diagnosis not present

## 2019-03-17 DIAGNOSIS — R262 Difficulty in walking, not elsewhere classified: Secondary | ICD-10-CM

## 2019-03-17 DIAGNOSIS — M6281 Muscle weakness (generalized): Secondary | ICD-10-CM | POA: Diagnosis not present

## 2019-03-17 DIAGNOSIS — G8929 Other chronic pain: Secondary | ICD-10-CM | POA: Diagnosis not present

## 2019-03-17 NOTE — Therapy (Signed)
[place], [place], [address] Phone: [phone]   Fax:  [phone]  Physical Therapy Treatment  Patient Details  Name: Dustin Hamilton MRN: [id number] Date of Birth: 04/20/1941 Referring Provider (PT): Benito Mccreedy MD   Encounter Date: 03/17/2019  PT End of Session - 03/17/19 0939    Visit Number  7    Number of Visits  16    Date for PT Re-Evaluation  04/20/19    Authorization Type  [employer]    PT Start Time  0932    PT Stop Time  1010    PT Time Calculation (min)  38 min       Past Medical History:  Diagnosis Date  . BPH (benign prostatic hyperplasia)   . Cerebrovascular disease   . Congenital anomaly of diaphragm   . Elevated PSA   . Glaucoma, both eyes   . Hemorrhoid   . Hepatitis B surface antigen positive    02-20-2011  . History of adenomatous polyp of colon    2007, 2009 and 2013  tubular adenoma's  . History of alcohol abuse    quit 1963  . History of cerebral parenchymal hemorrhage    01/ 2006  left occiptial lobe related to hypertensive crisis  . History of CVA (cerebrovascular accident)    09-12-2012  left hippocampus/ amygdala junction and per MRI old white matter infarcts--  per pt residual short- term memory issues  . History of fatty infiltration of liver hx visit's at [facility] , last visit 05/ 2014   elvated LFT's ,  via liver bx 2004 related to hx alcohol and drug abuse (quit 1964)  . 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 HTN (hypertension)   . Renal cyst, left   . Stroke ([place])    hx of 3 strokes in past   . 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 Urethral lesion    urethral mass    Past Surgical History:  Procedure Laterality Date  . CARDIOVASCULAR STRESS TEST  05/05/2007   normal nuclear study w/ no ischemia/  normal LV fucntion and wall motion , ef60%  . COLONOSCOPY  last one  04-06-2012  . CYSTO/  LEFT RETROGRADE PYELOGRAM/ CYTOLOGY WASHINGS/  URETEROSCOPY  03/05/2000  . INGUINAL HERNIA REPAIR Bilateral 1965 and 1980's  . LAPAROSCOPIC INGUINAL HERNIA WITH UMBILICAL HERNIA Right [id number]  . LIVER BIOPSY  1980's and 2004  . SVT ABLATION N/A 11/15/2018   Procedure: SVT ABLATION;  Surgeon: Evans Lance, MD;  Location: [facility];  Service: Cardiovascular;  Laterality: N/A;  . TRANSTHORACIC ECHOCARDIOGRAM  09/13/2012   moderate LVH,  ef 60-65%/    . TRANSURETHRAL RESECTION OF BLADDER TUMOR N/A 08/11/2016   Procedure: TRANSURETHRAL RESECTION OF BLADDER TUMOR (TURBT);  Surgeon: Cleon Gustin, MD;  Location: [facility];  Service: Urology;  Laterality: N/A;    There were no vitals filed for this visit.                    [facility] Adult PT Treatment/Exercise - 03/17/19 0001      Lumbar Exercises: Aerobic   Nustep  L5 x 5 minutes UE/LE       Lumbar Exercises: Standing   Heel Raises  15 reps    Other Standing Lumbar Exercises  hip flexion 3 x 10 each  Lumbar Exercises: Seated   Sit to Stand  10 reps    Sit to Stand Limitations  2 sets      Lumbar Exercises: Supine   Ab Set  10 reps    Bridge  20 reps    Isometric Hip Flexion  10 reps      Lumbar Exercises: Sidelying   Hip Abduction  10 reps    Hip Abduction Limitations  2 sets each           Balance Exercises - 03/17/19 0951      Balance Exercises: Standing   Tandem Stance  Eyes open;10 secs    SLS  Eyes open;Intermittent upper extremity support    Tandem Gait  Forward;Intermittent upper extremity support    Sidestepping  2 reps    Other Standing Exercises  alternating toe taps to 6 inch step then unilateral toe taps without UE           PT Short Term Goals - 03/15/19 1010      PT SHORT TERM GOAL #1   Title  Pt will be indepdendent in his HEP.    Baseline  initial HEP began on 02/18/2019    Time  3    Period  Weeks    Status  Achieved       PT SHORT TERM GOAL #2   Title  Pt will tolerate 20 minutes of ther exercise with no rest breaks to improve endurance.    Time  4    Period  Weeks    Status  Achieved        PT Long Term Goals - 03/04/19 0936      PT LONG TERM GOAL #1   Title  Pt will be able to improve BERG balance score from 35/56 to >/= 43/56.    Baseline  35/56 on 02/18/2019    Time  8    Period  Weeks    Status  New      PT LONG TERM GOAL #2   Title  Pt will amb 15 minutes with no rest breaks with step through gait pattern with LRAD on level surfaces.    Baseline  pt reporting he only walks short distances around his home and he becomes SOB.    Time  8    Period  Weeks    Status  New      PT LONG TERM GOAL #3   Title  Pt will improve his bilateral LE strength to >/= 4/5 in order to improve gait and functional mobility.    Baseline  see flow sheets    Time  8    Period  Weeks    Status  New            Plan - 03/17/19 1035    Clinical Impression Statement  Pt reports no pain today. He is tolerating therex well with improved endurance. He reports no increased pain with therex, only fatigue.    PT Next Visit Plan  review balance, standing core-bands, continue balance, did he eat before appointment?    PT Home Exercise Plan  Access Code: [email or web address], SL clams, seated LAQ, standing posture       Patient will benefit from skilled therapeutic intervention in order to improve the following deficits and impairments:  Abnormal gait, Pain, Postural dysfunction, Impaired flexibility, Decreased strength, Decreased activity tolerance, Decreased range of motion, Difficulty walking, Decreased balance, Cardiopulmonary status limiting activity  Visit Diagnosis: Chronic bilateral low back pain  without sciatica  Difficulty in walking, not elsewhere classified  Muscle weakness (generalized)     Problem List Patient Active Problem List   Diagnosis Date Noted  . SVT (supraventricular tachycardia) ([place])  06/03/2017  . Dizziness 08/02/2014  . 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 CVA (cerebral infarction) 09/13/2012  . Vertigo 09/12/2012  . EAR PAIN 09/05/2009  . BACK PAIN 06/20/2009  . COLONIC POLYPS 05/06/2007  . 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 NONDEPENDENT ALCOHOL ABUSE IN REMISSION 05/06/2007  . HYPERTENSION, SEVERE 05/06/2007  . Hypertensive heart disease without heart failure 05/06/2007  . INTRACRANIAL HEMORRHAGE 05/06/2007  . Cerebrovascular disease 05/06/2007  . HEMORRHOIDS 05/06/2007  . INGUINAL HERNIA, RIGHT 05/06/2007  . RENAL CYST 05/06/2007  . HEMATURIA UNSPECIFIED 05/06/2007  . 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 PSA, INCREASED 05/06/2007  . LIVER FUNCTION TESTS, ABNORMAL 05/06/2007  . BPH (benign prostatic hypertrophy) with urinary obstruction 05/06/2007  . PERS HX NONCOMPLIANCE W/MED [facility] HAZARDS HLTH 05/06/2007    Dorene Ar, PTA 03/17/2019, 10:39 AM  [facility] [address] [place], [place], [address] Phone: [phone]   Fax:  [phone]  Name: Dustin Hamilton MRN: [id number] Date of Birth: 15-Aug-1940

## 2019-03-22 ENCOUNTER — Encounter: Payer: Self-pay | Admitting: Physical Therapy

## 2019-03-22 ENCOUNTER — Ambulatory Visit: Payer: Medicare PPO | Admitting: Physical Therapy

## 2019-03-22 ENCOUNTER — Other Ambulatory Visit: Payer: Self-pay

## 2019-03-22 DIAGNOSIS — G8929 Other chronic pain: Secondary | ICD-10-CM | POA: Diagnosis not present

## 2019-03-22 DIAGNOSIS — R262 Difficulty in walking, not elsewhere classified: Secondary | ICD-10-CM

## 2019-03-22 DIAGNOSIS — M6281 Muscle weakness (generalized): Secondary | ICD-10-CM

## 2019-03-22 DIAGNOSIS — M545 Low back pain: Secondary | ICD-10-CM

## 2019-03-22 NOTE — Therapy (Signed)
[place], [place], [address] Phone: [phone]   Fax:  [phone]  Physical Therapy Treatment  Patient Details  Name: Dustin Hamilton MRN: [id number] Date of Birth: Mar 29, 1941 Referring Provider (PT): Benito Mccreedy MD   Encounter Date: 03/22/2019  PT End of Session - 03/22/19 0935    Visit Number  8    Number of Visits  16    Date for PT Re-Evaluation  04/20/19    Authorization Type  [employer]    PT Start Time  [phone]    PT Stop Time  1010    PT Time Calculation (min)  39 min    Activity Tolerance  Patient tolerated treatment well    Behavior During Therapy  [facility] for tasks assessed/performed       Past Medical History:  Diagnosis Date  . BPH (benign prostatic hyperplasia)   . Cerebrovascular disease   . Congenital anomaly of diaphragm   . Elevated PSA   . Glaucoma, both eyes   . Hemorrhoid   . Hepatitis B surface antigen positive    02-20-2011  . History of adenomatous polyp of colon    2007, 2009 and 2013  tubular adenoma's  . History of alcohol abuse    quit 1963  . History of cerebral parenchymal hemorrhage    01/ 2006  left occiptial lobe related to hypertensive crisis  . History of CVA (cerebrovascular accident)    09-12-2012  left hippocampus/ amygdala junction and per MRI old white matter infarcts--  per pt residual short- term memory issues  . History of fatty infiltration of liver hx visit's at [facility] , last visit 05/ 2014   elvated LFT's ,  via liver bx 2004 related to hx alcohol and drug abuse (quit 1964)  . 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 HTN (hypertension)   . Renal cyst, left   . Stroke ([place])    hx of 3 strokes in past   . 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 Urethral lesion    urethral mass    Past Surgical History:  Procedure Laterality Date  . CARDIOVASCULAR STRESS TEST   05/05/2007   normal nuclear study w/ no ischemia/  normal LV fucntion and wall motion , ef60%  . COLONOSCOPY  last one 04-06-2012  . CYSTO/  LEFT RETROGRADE PYELOGRAM/ CYTOLOGY WASHINGS/  URETEROSCOPY  03/05/2000  . INGUINAL HERNIA REPAIR Bilateral 1965 and 1980's  . LAPAROSCOPIC INGUINAL HERNIA WITH UMBILICAL HERNIA Right [id number]  . LIVER BIOPSY  1980's and 2004  . SVT ABLATION N/A 11/15/2018   Procedure: SVT ABLATION;  Surgeon: Evans Lance, MD;  Location: [place] [facility];  Service: Cardiovascular;  Laterality: N/A;  . TRANSTHORACIC ECHOCARDIOGRAM  09/13/2012   moderate LVH,  ef 60-65%/    . TRANSURETHRAL RESECTION OF BLADDER TUMOR N/A 08/11/2016   Procedure: TRANSURETHRAL RESECTION OF BLADDER TUMOR (TURBT);  Surgeon: Cleon Gustin, MD;  Location: [facility];  Service: Urology;  Laterality: N/A;    There were no vitals filed for this visit.  Subjective Assessment - 03/22/19 0933    Subjective  Sleeping with the pillow every night and it feels good. Not bad pain today.    Patient Stated Goals  Be able to move without pain and walk without pain    Currently in Pain?  No/denies  [facility] PT Assessment - 03/22/19 0001      Assessment   Medical Diagnosis  Low back pain    Referring Provider (PT)  Benito Mccreedy MD    Onset Date/Surgical Date  --   6-7 mo ago     Strength   Right Hip Flexion  5/5    Right Hip ABduction  4+/5    Right Hip ADduction  4/5    Left Hip Flexion  4+/5    Left Hip ABduction  4+/5    Left Hip ADduction  4/5      Transfers   Five time sit to stand comments   13s, hands on knees but minimal press                   [facility] Adult PT Treatment/Exercise - 03/22/19 0001      Lumbar Exercises: Stretches   Passive Hamstring Stretch Limitations  seated EOB    Piriformis Stretch Limitations  seated EOB    Other Lumbar Stretch Exercise  SKTC & DKTC      Lumbar Exercises: Standing   Theraband Level (Row)  Level  4 (Blue)    Row Limitations  feet together and wide tandem    Theraband Level (Shoulder Extension)  Level 1 (Yellow)    Other Standing Lumbar Exercises  step with opp UE reach    Other Standing Lumbar Exercises  hip hinge with fwd reach               PT Short Term Goals - 03/15/19 1010      PT SHORT TERM GOAL #1   Title  Pt will be indepdendent in his HEP.    Baseline  initial HEP began on 02/18/2019    Time  3    Period  Weeks    Status  Achieved      PT SHORT TERM GOAL #2   Title  Pt will tolerate 20 minutes of ther exercise with no rest breaks to improve endurance.    Time  4    Period  Weeks    Status  Achieved        PT Long Term Goals - 03/04/19 0936      PT LONG TERM GOAL #1   Title  Pt will be able to improve BERG balance score from 35/56 to >/= 43/56.    Baseline  35/56 on 02/18/2019    Time  8    Period  Weeks    Status  New      PT LONG TERM GOAL #2   Title  Pt will amb 15 minutes with no rest breaks with step through gait pattern with LRAD on level surfaces.    Baseline  pt reporting he only walks short distances around his home and he becomes SOB.    Time  8    Period  Weeks    Status  New      PT LONG TERM GOAL #3   Title  Pt will improve his bilateral LE strength to >/= 4/5 in order to improve gait and functional mobility.    Baseline  see flow sheets    Time  8    Period  Weeks    Status  New            Plan - 03/22/19 1015    Clinical Impression Statement  Good tolerance to exercise, cues required for return to proper posture during balance exercises. Denied increase in pain but  did feel fatigue in his core.    PT Treatment/Interventions  ADLs/Self Care Home Management;Moist Heat;Ultrasound;Stair training;Gait training;Functional mobility training;Therapeutic activities;Therapeutic exercise;Balance training;Neuromuscular re-education;Patient/family education;Manual techniques;Passive range of motion    PT Next Visit Plan  check that he  eats before appointment, progress balance    PT Home Exercise Plan  Access Code: [email or web address], SL clams, seated LAQ, standing posture, anchored row & ext, alt step with opp UE reach    Consulted and Agree with Plan of Care  Patient       Patient will benefit from skilled therapeutic intervention in order to improve the following deficits and impairments:  Abnormal gait, Pain, Postural dysfunction, Impaired flexibility, Decreased strength, Decreased activity tolerance, Decreased range of motion, Difficulty walking, Decreased balance, Cardiopulmonary status limiting activity  Visit Diagnosis: Chronic bilateral low back pain without sciatica  Difficulty in walking, not elsewhere classified  Muscle weakness (generalized)     Problem List Patient Active Problem List   Diagnosis Date Noted  . SVT (supraventricular tachycardia) ([place]) 06/03/2017  . Dizziness 08/02/2014  . 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 CVA (cerebral infarction) 09/13/2012  . Vertigo 09/12/2012  . EAR PAIN 09/05/2009  . BACK PAIN 06/20/2009  . COLONIC POLYPS 05/06/2007  . 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 NONDEPENDENT ALCOHOL ABUSE IN REMISSION 05/06/2007  . HYPERTENSION, SEVERE 05/06/2007  . Hypertensive heart disease without heart failure 05/06/2007  . INTRACRANIAL HEMORRHAGE 05/06/2007  . Cerebrovascular disease 05/06/2007  . HEMORRHOIDS 05/06/2007  . INGUINAL HERNIA, RIGHT 05/06/2007  . RENAL CYST 05/06/2007  . HEMATURIA UNSPECIFIED 05/06/2007  . 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 PSA, INCREASED 05/06/2007  . LIVER FUNCTION TESTS, ABNORMAL 05/06/2007  . BPH (benign prostatic hypertrophy) with urinary obstruction 05/06/2007  . PERS HX NONCOMPLIANCE W/MED [place] [facility] HAZARDS HLTH 05/06/2007   Kerri-Anne Haeberle C. Constancia Geeting PT, DPT 03/22/19 11:15 AM   [facility] [address] [place], [place], [address] Phone: [phone]   Fax:   [phone]  Name: MASEO LATTIN MRN: [id number] Date of Birth: [birth year]

## 2019-03-24 ENCOUNTER — Other Ambulatory Visit: Payer: Self-pay

## 2019-03-24 ENCOUNTER — Ambulatory Visit: Payer: Medicare PPO | Admitting: Physical Therapy

## 2019-03-24 ENCOUNTER — Encounter: Payer: Self-pay | Admitting: Physical Therapy

## 2019-03-24 DIAGNOSIS — M6281 Muscle weakness (generalized): Secondary | ICD-10-CM | POA: Diagnosis not present

## 2019-03-24 DIAGNOSIS — M545 Low back pain: Secondary | ICD-10-CM | POA: Diagnosis not present

## 2019-03-24 DIAGNOSIS — R262 Difficulty in walking, not elsewhere classified: Secondary | ICD-10-CM | POA: Diagnosis not present

## 2019-03-24 DIAGNOSIS — G8929 Other chronic pain: Secondary | ICD-10-CM | POA: Diagnosis not present

## 2019-03-24 NOTE — Therapy (Signed)
[place], [place], [address] Phone: [phone]   Fax:  [phone]  Physical Therapy Treatment  Patient Details  Name: Dustin Hamilton MRN: [id number] Date of Birth: 07/05/1940 Referring Provider (PT): Benito Mccreedy MD   Encounter Date: 03/24/2019  PT End of Session - 03/24/19 0937    Visit Number  9    Number of Visits  16    Date for PT Re-Evaluation  04/20/19    PT Start Time  0930    PT Stop Time  [id number]    PT Time Calculation (min)  44 min       Past Medical History:  Diagnosis Date  . BPH (benign prostatic hyperplasia)   . Cerebrovascular disease   . Congenital anomaly of diaphragm   . Elevated PSA   . Glaucoma, both eyes   . Hemorrhoid   . Hepatitis B surface antigen positive    02-20-2011  . History of adenomatous polyp of colon    2007, 2009 and 2013  tubular adenoma's  . History of alcohol abuse    quit 1963  . History of cerebral parenchymal hemorrhage    01/ 2006  left occiptial lobe related to hypertensive crisis  . History of CVA (cerebrovascular accident)    09-12-2012  left hippocampus/ amygdala junction and per MRI old white matter infarcts--  per pt residual short- term memory issues  . History of fatty infiltration of liver hx visit's at [facility] , last visit 05/ 2014   elvated LFT's ,  via liver bx 2004 related to hx alcohol and drug abuse (quit 1964)  . 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 HTN (hypertension)   . Renal cyst, left   . Stroke ([place])    hx of 3 strokes in past   . 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 Urethral lesion    urethral mass    Past Surgical History:  Procedure Laterality Date  . CARDIOVASCULAR STRESS TEST  05/05/2007   normal nuclear study w/ no ischemia/  normal LV fucntion and wall motion , ef60%  . COLONOSCOPY  last one 04-06-2012  . CYSTO/  LEFT RETROGRADE  PYELOGRAM/ CYTOLOGY WASHINGS/  URETEROSCOPY  03/05/2000  . INGUINAL HERNIA REPAIR Bilateral 1965 and 1980's  . LAPAROSCOPIC INGUINAL HERNIA WITH UMBILICAL HERNIA Right [id number]  . LIVER BIOPSY  1980's and 2004  . SVT ABLATION N/A 11/15/2018   Procedure: SVT ABLATION;  Surgeon: Evans Lance, MD;  Location: [place] [facility];  Service: Cardiovascular;  Laterality: N/A;  . TRANSTHORACIC ECHOCARDIOGRAM  09/13/2012   moderate LVH,  ef 60-65%/    . TRANSURETHRAL RESECTION OF BLADDER TUMOR N/A 08/11/2016   Procedure: TRANSURETHRAL RESECTION OF BLADDER TUMOR (TURBT);  Surgeon: Cleon Gustin, MD;  Location: [facility];  Service: Urology;  Laterality: N/A;    There were no vitals filed for this visit.                    [facility] PT Treatment/Exercise - 03/24/19 0001      Lumbar Exercises: Stretches   Passive Hamstring Stretch Limitations  seated EOB    Piriformis Stretch Limitations  seated EOB    Other Lumbar Stretch Exercise  SKTC & DKTC      Lumbar Exercises: Aerobic   Nustep  L5 x 5 minutes UE/LE  Lumbar Exercises: Standing   Heel Raises  20 reps    Other Standing Lumbar Exercises  step with opp UE reach      Lumbar Exercises: Seated   Sit to Stand  20 reps      Lumbar Exercises: Supine   Bridge  20 reps    Isometric Hip Flexion  10 reps      Lumbar Exercises: Sidelying   Hip Abduction  10 reps    Hip Abduction Limitations  2 sets each           Balance Exercises - 03/24/19 0950      Balance Exercises: Standing   Tandem Stance  Eyes open;10 secs    Tandem Gait  Forward;Intermittent upper extremity support    Sidestepping  2 reps    Other Standing Exercises  alternating toe taps to 6 inch step then unilateral toe taps without UE           PT Short Term Goals - 03/15/19 1010      PT SHORT TERM GOAL #1   Title  Pt will be indepdendent in his HEP.    Baseline  initial HEP began on 02/18/2019    Time  3    Period   Weeks    Status  Achieved      PT SHORT TERM GOAL #2   Title  Pt will tolerate 20 minutes of ther exercise with no rest breaks to improve endurance.    Time  4    Period  Weeks    Status  Achieved        PT Long Term Goals - 03/04/19 0936      PT LONG TERM GOAL #1   Title  Pt will be able to improve BERG balance score from 35/56 to >/= 43/56.    Baseline  35/56 on 02/18/2019    Time  8    Period  Weeks    Status  New      PT LONG TERM GOAL #2   Title  Pt will amb 15 minutes with no rest breaks with step through gait pattern with LRAD on level surfaces.    Baseline  pt reporting he only walks short distances around his home and he becomes SOB.    Time  8    Period  Weeks    Status  New      PT LONG TERM GOAL #3   Title  Pt will improve his bilateral LE strength to >/= 4/5 in order to improve gait and functional mobility.    Baseline  see flow sheets    Time  8    Period  Weeks    Status  New            Plan - 03/24/19 1029    Clinical Impression Statement  Dustin Hamilton reports no pain today. He had some increased pain with step and reach in his low back. He requires cues to engage core in standing. Continued with balance, hip and core strength. He was fatigued et end of session.    PT Next Visit Plan  check that he eats before appointment, progress balance    PT Home Exercise Plan  Access Code: [email or web address], SL clams, seated LAQ, standing posture, anchored row & ext, alt step with opp UE reach       Patient will benefit from skilled therapeutic intervention in order to improve the following deficits and impairments:  Abnormal gait, Pain, Postural dysfunction, Impaired flexibility,  Decreased strength, Decreased activity tolerance, Decreased range of motion, Difficulty walking, Decreased balance, Cardiopulmonary status limiting activity  Visit Diagnosis: Chronic bilateral low back pain without sciatica  Difficulty in walking, not elsewhere classified  Muscle weakness  (generalized)     Problem List Patient Active Problem List   Diagnosis Date Noted  . SVT (supraventricular tachycardia) ([place]) 06/03/2017  . Dizziness 08/02/2014  . 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 CVA (cerebral infarction) 09/13/2012  . Vertigo 09/12/2012  . EAR PAIN 09/05/2009  . BACK PAIN 06/20/2009  . COLONIC POLYPS 05/06/2007  . 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 NONDEPENDENT ALCOHOL ABUSE IN REMISSION 05/06/2007  . HYPERTENSION, SEVERE 05/06/2007  . Hypertensive heart disease without heart failure 05/06/2007  . INTRACRANIAL HEMORRHAGE 05/06/2007  . Cerebrovascular disease 05/06/2007  . HEMORRHOIDS 05/06/2007  . INGUINAL HERNIA, RIGHT 05/06/2007  . RENAL CYST 05/06/2007  . HEMATURIA UNSPECIFIED 05/06/2007  . 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 PSA, INCREASED 05/06/2007  . LIVER FUNCTION TESTS, ABNORMAL 05/06/2007  . BPH (benign prostatic hypertrophy) with urinary obstruction 05/06/2007  . PERS HX NONCOMPLIANCE W/MED [facility] HAZARDS HLTH 05/06/2007    Dorene Ar, PTA 03/24/2019, 10:35 AM  [facility] [address] [place], [place], [address] Phone: [phone]   Fax:  [phone]  Name: Dustin Hamilton MRN: [id number] Date of Birth: 1940-09-26

## 2019-03-29 ENCOUNTER — Emergency Department (HOSPITAL_COMMUNITY): Payer: No Typology Code available for payment source

## 2019-03-29 ENCOUNTER — Encounter (HOSPITAL_COMMUNITY): Payer: Self-pay | Admitting: Emergency Medicine

## 2019-03-29 ENCOUNTER — Ambulatory Visit: Payer: Medicare PPO | Admitting: Physical Therapy

## 2019-03-29 ENCOUNTER — Encounter: Payer: Self-pay | Admitting: Physical Therapy

## 2019-03-29 ENCOUNTER — Other Ambulatory Visit: Payer: Self-pay

## 2019-03-29 ENCOUNTER — Emergency Department (HOSPITAL_COMMUNITY)
Admission: EM | Admit: 2019-03-29 | Discharge: 2019-03-29 | Disposition: A | Discharge status: Home / Self Care | Payer: No Typology Code available for payment source | Attending: Emergency Medicine | Admitting: Emergency Medicine

## 2019-03-29 DIAGNOSIS — I440 Atrioventricular block, first degree: Secondary | ICD-10-CM | POA: Diagnosis not present

## 2019-03-29 DIAGNOSIS — G8929 Other chronic pain: Secondary | ICD-10-CM

## 2019-03-29 DIAGNOSIS — Z880 Allergy status to penicillin: Secondary | ICD-10-CM | POA: Diagnosis not present

## 2019-03-29 DIAGNOSIS — I10 Essential (primary) hypertension: Secondary | ICD-10-CM | POA: Diagnosis not present

## 2019-03-29 DIAGNOSIS — M545 Low back pain, unspecified: Secondary | ICD-10-CM

## 2019-03-29 DIAGNOSIS — I4430 Unspecified atrioventricular block: Secondary | ICD-10-CM | POA: Diagnosis not present

## 2019-03-29 DIAGNOSIS — Z79899 Other long term (current) drug therapy: Secondary | ICD-10-CM | POA: Insufficient documentation

## 2019-03-29 DIAGNOSIS — Z87891 Personal history of nicotine dependence: Secondary | ICD-10-CM | POA: Insufficient documentation

## 2019-03-29 DIAGNOSIS — R4182 Altered mental status, unspecified: Secondary | ICD-10-CM | POA: Diagnosis not present

## 2019-03-29 DIAGNOSIS — I959 Hypotension, unspecified: Secondary | ICD-10-CM | POA: Diagnosis not present

## 2019-03-29 DIAGNOSIS — R262 Difficulty in walking, not elsewhere classified: Secondary | ICD-10-CM

## 2019-03-29 DIAGNOSIS — Z8673 Personal history of transient ischemic attack (TIA), and cerebral infarction without residual deficits: Secondary | ICD-10-CM | POA: Insufficient documentation

## 2019-03-29 DIAGNOSIS — M6281 Muscle weakness (generalized): Secondary | ICD-10-CM

## 2019-03-29 DIAGNOSIS — I9589 Other hypotension: Secondary | ICD-10-CM | POA: Diagnosis not present

## 2019-03-29 DIAGNOSIS — R404 Transient alteration of awareness: Secondary | ICD-10-CM | POA: Diagnosis not present

## 2019-03-29 LAB — BASIC METABOLIC PANEL
Anion gap: 7 (ref 5–15)
BUN: 21 mg/dL (ref 8–23)
CO2: 25 mmol/L (ref 22–32)
Calcium: 9.9 mg/dL (ref 8.9–10.3)
Chloride: 110 mmol/L (ref 98–111)
Creatinine, Ser: 1.22 mg/dL (ref 0.61–1.24)
GFR calc Af Amer: >60 mL/min (ref >60)
GFR calc non Af Amer: 56 mL/min — ABNORMAL LOW (ref >60)
Glucose, Bld: 96 mg/dL (ref 70–99)
Potassium: 4.5 mmol/L (ref 3.5–5.1)
Sodium: 142 mmol/L (ref 135–145)

## 2019-03-29 LAB — CBC WITH DIFFERENTIAL/PLATELET
Abs Immature Granulocytes: 0.01 10*3/uL (ref 0.00–0.07)
Basophils Absolute: 0 10*3/uL (ref 0.0–0.1)
Basophils Relative: 0 %
Eosinophils Absolute: 0.1 10*3/uL (ref 0.0–0.5)
Eosinophils Relative: 3 %
HCT: 45.9 % (ref 39.0–52.0)
Hemoglobin: 15.5 g/dL (ref 13.0–17.0)
Immature Granulocytes: 0 %
Lymphocytes Relative: 24 %
Lymphs Abs: 1.1 10*3/uL (ref 0.7–4.0)
MCH: 31.9 pg (ref 26.0–34.0)
MCHC: 33.8 g/dL (ref 30.0–36.0)
MCV: 94.4 fL (ref 80.0–100.0)
Monocytes Absolute: 0.3 10*3/uL (ref 0.1–1.0)
Monocytes Relative: 7 %
Neutro Abs: 3.2 10*3/uL (ref 1.7–7.7)
Neutrophils Relative %: 66 %
Platelets: 130 10*3/uL — ABNORMAL LOW (ref 150–400)
RBC: 4.86 MIL/uL (ref 4.22–5.81)
RDW: 14.4 % (ref 11.5–15.5)
WBC: 4.8 10*3/uL (ref 4.0–10.5)
nRBC: 0 % (ref 0.0–0.2)

## 2019-03-29 LAB — URINALYSIS, ROUTINE W REFLEX MICROSCOPIC
Bilirubin Urine: NEGATIVE
Glucose, UA: NEGATIVE mg/dL
Hgb urine dipstick: NEGATIVE
Ketones, ur: NEGATIVE mg/dL
Nitrite: NEGATIVE
Protein, ur: 30 mg/dL — AB
Specific Gravity, Urine: 1.016 (ref 1.005–1.030)
pH: 6 (ref 5.0–8.0)

## 2019-03-29 LAB — PROTIME-INR
INR: 1.1 (ref 0.8–1.2)
Prothrombin Time: 14.3 seconds (ref 11.4–15.2)

## 2019-03-29 LAB — CBG MONITORING, ED: Glucose-Capillary: 94 mg/dL (ref 70–99)

## 2019-03-29 MED ORDER — SODIUM CHLORIDE 0.9 % IV BOLUS
1000.0000 mL | Freq: Once | INTRAVENOUS | Status: AC
  Administered 2019-03-29: 1000 mL via INTRAVENOUS

## 2019-03-29 MED ORDER — SODIUM CHLORIDE 0.9 % IV SOLN: INTRAVENOUS | Status: DC

## 2019-03-29 NOTE — Discharge Instructions (Addendum)
Check your BP prior to taking your meds.

## 2019-03-29 NOTE — Therapy (Signed)
[place], [place], [address] Phone: [phone]   Fax:  [phone]  Physical Therapy Treatment/Discharge Progress Note Reporting Period 02/18/2019 to 03/29/2019   See note below for Objective Data and Assessment of Progress/Goals.       Patient Details  Name: Dustin Hamilton MRN: [id number] Date of Birth: 03-19-1941 Referring Provider (PT): Benito Mccreedy MD   Encounter Date: 03/29/2019  PT End of Session - 03/29/19 0937    Visit Number  10    Number of Visits  16    Date for PT Re-Evaluation  04/20/19    Authorization Type  [employer]    PT Start Time  0930    PT Stop Time  1004    PT Time Calculation (min)  34 min    Activity Tolerance  Patient tolerated treatment well    Behavior During Therapy  [facility] for tasks assessed/performed       Past Medical History:  Diagnosis Date  . BPH (benign prostatic hyperplasia)   . Cerebrovascular disease   . Congenital anomaly of diaphragm   . Elevated PSA   . Glaucoma, both eyes   . Hemorrhoid   . Hepatitis B surface antigen positive    02-20-2011  . History of adenomatous polyp of colon    2007, 2009 and 2013  tubular adenoma's  . History of alcohol abuse    quit 1963  . History of cerebral parenchymal hemorrhage    01/ 2006  left occiptial lobe related to hypertensive crisis  . History of CVA (cerebrovascular accident)    09-12-2012  left hippocampus/ amygdala junction and per MRI old white matter infarcts--  per pt residual short- term memory issues  . History of fatty infiltration of liver hx visit's at [facility] , last visit 05/ 2014   elvated LFT's ,  via liver bx 2004 related to hx alcohol and drug abuse (quit 1964)  . 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 HTN (hypertension)   . Renal cyst, left   . Stroke ([place])    hx of 3 strokes in past   . 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 Urethral lesion    urethral mass    Past Surgical History:  Procedure Laterality Date  . CARDIOVASCULAR STRESS TEST  05/05/2007   normal nuclear study w/ no ischemia/  normal LV fucntion and wall motion , ef60%  . COLONOSCOPY  last one 04-06-2012  . CYSTO/  LEFT RETROGRADE PYELOGRAM/ CYTOLOGY WASHINGS/  URETEROSCOPY  03/05/2000  . INGUINAL HERNIA REPAIR Bilateral 1965 and 1980's  . LAPAROSCOPIC INGUINAL HERNIA WITH UMBILICAL HERNIA Right 16/06/930  . LIVER BIOPSY  1980's and 2004  . SVT ABLATION N/A 11/15/2018   Procedure: SVT ABLATION;  Surgeon: Evans Lance, MD;  Location: [facility];  Service: Cardiovascular;  Laterality: N/A;  . TRANSTHORACIC ECHOCARDIOGRAM  09/13/2012   moderate LVH,  ef 60-65%/    . TRANSURETHRAL RESECTION OF BLADDER TUMOR N/A 08/11/2016   Procedure: TRANSURETHRAL RESECTION OF BLADDER TUMOR (TURBT);  Surgeon: Cleon Gustin, MD;  Location: [facility];  Service: Urology;  Laterality: N/A;    There were no vitals filed for this visit.  Subjective Assessment - 03/29/19 0935    Subjective  My back hurts today, I cleaned off the patio yesterday. Tried to do the stretches but it hurt  too bad.    Patient Stated Goals  Be able to move without pain and walk without pain    Currently in Pain?  Yes    Pain Score  4     Pain Location  Back    Pain Orientation  Lower    Pain Descriptors / Indicators  Sore    Aggravating Factors   cleaning the patio    Pain Relieving Factors  pillow for positioning         [facility] PT Assessment - 03/29/19 0001      Assessment   Medical Diagnosis  Low back pain    Referring Provider (PT)  Benito Mccreedy MD    Onset Date/Surgical Date  --   6-7 mo ago   Hand Dominance  Right    Next MD Visit  follow up TBD    Prior Therapy  a couple of  years ago, Neuro therapy       Home Environment   Additional Comments  Pt has a lift chair to get him up the steps in his home.        Prior Function   Level of Independence  Independent with basic ADLs    Vocation  Retired    [employer]  retired Midwife on tv, watching sports      Cognition   Overall Cognitive Status  Within Functional Limits for tasks assessed      Observation/Other Assessments   Focus on Therapeutic Outcomes (FOTO)   33% limitation      Posture/Postural Control   Posture Comments  anterior shift of hips but improved       Strength   Right Hip Flexion  5/5    Right Hip ABduction  4+/5    Left Hip Flexion  5/5    Left Hip ABduction  5/5      Berg Balance Test   Sit to Stand  Able to stand without using hands and stabilize independently    Standing Unsupported  Able to stand safely 2 minutes    Sitting with Back Unsupported but Feet Supported on Floor or Stool  Able to sit safely and securely 2 minutes    Stand to Sit  Sits safely with minimal use of hands    Transfers  Able to transfer safely, minor use of hands    Standing Unsupported with Eyes Closed  Able to stand 10 seconds safely    Standing Unsupported with Feet Together  Able to place feet together independently and stand 1 minute safely    From Standing, Reach Forward with Outstretched Arm  Can reach forward >5 cm safely (2")    From Standing Position, Pick up Object from Floor  Able to pick up shoe safely and easily    From Standing Position, Turn to Look Behind Over each Shoulder  Looks behind from both sides and weight shifts well    Turn 360 Degrees  Able to turn 360 degrees safely one side only in 4 seconds or less    Standing Unsupported, Alternately Place Feet on Step/Stool  Able to complete >2 steps/needs minimal assist    Standing Unsupported, One Foot in Front  Able to take small step independently and hold 30 seconds    Standing on One Leg  Tries to lift leg/unable to hold 3 seconds but remains standing independently    Total Score  45  PT  Education - 03/29/19 1007    Education Details  importance of continued HEP, FOTO, goals, importance of safety with balance HEP    Person(s) Educated  Patient    Methods  Explanation    Comprehension  Verbalized understanding       PT Short Term Goals - 03/15/19 1010      PT SHORT TERM GOAL #1   Title  Pt will be indepdendent in his HEP.    Baseline  initial HEP began on 02/18/2019    Time  3    Period  Weeks    Status  Achieved      PT SHORT TERM GOAL #2   Title  Pt will tolerate 20 minutes of ther exercise with no rest breaks to improve endurance.    Time  4    Period  Weeks    Status  Achieved        PT Long Term Goals - 03/29/19 7209      PT LONG TERM GOAL #1   Title  Pt will be able to improve BERG balance score from 35/56 to >/= 43/56.    Baseline  45/56    Status  Achieved      PT LONG TERM GOAL #2   Title  Pt will amb 15 minutes with no rest breaks with step through gait pattern with LRAD on level surfaces.    Baseline  able to walk about 20 min where I live    Status  Achieved      PT LONG TERM GOAL #3   Title  Pt will improve his bilateral LE strength to >/= 4/5 in order to improve gait and functional mobility.    Baseline  see flow sheets    Status  Achieved            Plan - 03/29/19 1007    Clinical Impression Statement  Pt has met all of his goals at this time and feels comfortable with d/c to independent program. We discussed overuse and importance of rest breaks to improve endurance without reaching significant soreness. Encouraged him to contact us with any further questions.    PT Treatment/Interventions  ADLs/Self Care Home Management;Moist Heat;Ultrasound;Stair training;Gait training;Functional mobility training;Therapeutic activities;Therapeutic exercise;Balance training;Neuromuscular re-education;Patient/family education;Manual techniques;Passive range of motion    PT Home Exercise Plan  Access Code: [email or web address], SL clams, seated LAQ, standing  posture, anchored row & ext, alt step with opp UE reach    Consulted and Agree with Plan of Care  Patient       Patient will benefit from skilled therapeutic intervention in order to improve the following deficits and impairments:  Abnormal gait, Pain, Postural dysfunction, Impaired flexibility, Decreased strength, Decreased activity tolerance, Decreased range of motion, Difficulty walking, Decreased balance, Cardiopulmonary status limiting activity  Visit Diagnosis: Chronic bilateral low back pain without sciatica  Difficulty in walking, not elsewhere classified  Muscle weakness (generalized)     Problem List Patient Active Problem List   Diagnosis Date Noted  . SVT (supraventricular tachycardia) ([place]) 06/03/2017  . Dizziness 08/02/2014  . 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 CVA (cerebral infarction) 09/13/2012  . Vertigo 09/12/2012  . EAR PAIN 09/05/2009  . BACK PAIN 06/20/2009  . COLONIC POLYPS 05/06/2007  . 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 NONDEPENDENT ALCOHOL ABUSE IN REMISSION 05/06/2007  . HYPERTENSION, SEVERE 05/06/2007  . Hypertensive heart disease without heart failure 05/06/2007  . INTRACRANIAL HEMORRHAGE 05/06/2007  . Cerebrovascular disease 05/06/2007  . HEMORRHOIDS  05/06/2007  . INGUINAL HERNIA, RIGHT 05/06/2007  . RENAL CYST 05/06/2007  . HEMATURIA UNSPECIFIED 05/06/2007  . 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 PSA, INCREASED 05/06/2007  . LIVER FUNCTION TESTS, ABNORMAL 05/06/2007  . BPH (benign prostatic hypertrophy) with urinary obstruction 05/06/2007  . PERS HX NONCOMPLIANCE W/MED TX PRS HAZARDS HLTH 05/06/2007    PHYSICAL THERAPY DISCHARGE SUMMARY  Visits from Start of Care: 10  Current functional level related to goals / functional outcomes: See above   Remaining deficits: See above   Education / Equipment: Anatomy of condition, POC, HEP, exercise form/rationale  Plan: Patient agrees to discharge.  Patient goals  were met. Patient is being discharged due to meeting the stated rehab goals.  ?????     Collan Schoenfeld C. Manvi Guilliams PT, DPT 03/29/19 10:10 AM   [facility] [address] [place], [place], [address] Phone: [phone]   Fax:  [phone]  Name: Dustin Hamilton MRN: [id number] Date of Birth: 1940-06-10

## 2019-03-29 NOTE — ED Provider Notes (Signed)
[facility] EMERGENCY DEPARTMENT Provider Note   CSN: [id number] Arrival date & time: 03/29/19  1149     History   Chief Complaint Chief Complaint  Patient presents with  . Altered Mental Status    HPI Dustin Hamilton is a 78 y.o. male.     Pt presents to the ED today with AMS.  The pt had a Dr's appt today and did not eat, but took his home meds.  Pt went to PT and did well at PT.  Pt and his wife were in the drive through line at [facility] when she looked over and he was not responsive.  EMS was called.  They said initial SBP was 60/palp.  Pt was given IVFs and is feeling much better now.  He denies any pain.  Pt said he remembers going to [place], but then "woke up" in the ambulance.  He feels completely back to nl.  He does not recall having any pain.     Past Medical History:  Diagnosis Date  . BPH (benign prostatic hyperplasia)   . Cerebrovascular disease   . Congenital anomaly of diaphragm   . Elevated PSA   . Glaucoma, both eyes   . Hemorrhoid   . Hepatitis B surface antigen positive    02-20-2011  . History of adenomatous polyp of colon    2007, 2009 and 2013  tubular adenoma's  . History of alcohol abuse    quit 1963  . History of cerebral parenchymal hemorrhage    01/ 2006  left occiptial lobe related to hypertensive crisis  . History of CVA (cerebrovascular accident)    09-12-2012  left hippocampus/ amygdala junction and per MRI old white matter infarcts--  per pt residual short- term memory issues  . History of fatty infiltration of liver hx visit's at [facility] , last visit 05/ 2014   elvated LFT's ,  via liver bx 2004 related to hx alcohol and drug abuse (quit 1964)  . 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 HTN (hypertension)   . Renal cyst, left   . Stroke ([place])    hx of 3 strokes in past   . 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 Urethral lesion    urethral  mass    Patient Active Problem List   Diagnosis Date Noted  . SVT (supraventricular tachycardia) ([place]) 06/03/2017  . Dizziness 08/02/2014  . 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 CVA (cerebral infarction) 09/13/2012  . Vertigo 09/12/2012  . EAR PAIN 09/05/2009  . BACK PAIN 06/20/2009  . COLONIC POLYPS 05/06/2007  . 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 NONDEPENDENT ALCOHOL ABUSE IN REMISSION 05/06/2007  . HYPERTENSION, SEVERE 05/06/2007  . Hypertensive heart disease without heart failure 05/06/2007  . INTRACRANIAL HEMORRHAGE 05/06/2007  . Cerebrovascular disease 05/06/2007  . HEMORRHOIDS 05/06/2007  . INGUINAL HERNIA, RIGHT 05/06/2007  . RENAL CYST 05/06/2007  . HEMATURIA UNSPECIFIED 05/06/2007  . 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 PSA, INCREASED 05/06/2007  . LIVER FUNCTION TESTS, ABNORMAL 05/06/2007  . BPH (benign prostatic hypertrophy) with urinary obstruction 05/06/2007  . PERS HX NONCOMPLIANCE W/MED TX PRS HAZARDS HLTH 05/06/2007    Past Surgical History:  Procedure Laterality Date  . CARDIOVASCULAR STRESS TEST  05/05/2007   normal nuclear study w/ no ischemia/  normal LV fucntion and wall motion , ef60%  . COLONOSCOPY  last one 04-06-2012  .  CYSTO/  LEFT RETROGRADE PYELOGRAM/ CYTOLOGY WASHINGS/  URETEROSCOPY  03/05/2000  . INGUINAL HERNIA REPAIR Bilateral 1965 and 1980's  . LAPAROSCOPIC INGUINAL HERNIA WITH UMBILICAL HERNIA Right [id number]  . LIVER BIOPSY  1980's and 2004  . SVT ABLATION N/A 11/15/2018   Procedure: SVT ABLATION;  Surgeon: Evans Lance, MD;  Location: [facility];  Service: Cardiovascular;  Laterality: N/A;  . TRANSTHORACIC ECHOCARDIOGRAM  09/13/2012   moderate LVH,  ef 60-65%/    . TRANSURETHRAL RESECTION OF BLADDER TUMOR N/A 08/11/2016   Procedure: TRANSURETHRAL RESECTION OF BLADDER TUMOR (TURBT);  Surgeon: Cleon Gustin, MD;  Location: [facility];  Service: Urology;  Laterality: N/A;         Home Medications    Prior to Admission medications   Medication Sig Start Date End Date Taking? Authorizing Provider  amLODipine (NORVASC) 5 MG tablet Take 5 mg by mouth daily.    [provider]  aspirin EC 81 MG tablet Take 81 mg by mouth daily.    [provider]  brimonidine (ALPHAGAN) 0.2 % ophthalmic solution Place 1 drop into both eyes 2 (two) times daily.    [provider]  carvedilol (COREG) 25 MG tablet Take 25 mg by mouth 2 (two) times a day.     [provider]  cetirizine (ZYRTEC) 5 MG chewable tablet Chew 1 tablet (5 mg total) by mouth daily. 06/04/18   [place], [place], PA-C  clopidogrel (PLAVIX) 75 MG tablet Take 1 tablet (75 mg total) by mouth daily with breakfast. 05/06/13   Noralee Space, MD  donepezil (ARICEPT) 5 MG tablet Take 5 mg by mouth at bedtime.    [provider]  finasteride (PROSCAR) 5 MG tablet Take 5 mg by mouth daily.     [provider]  latanoprost (XALATAN) 0.005 % ophthalmic solution Place 1 drop into both eyes at bedtime.     [provider]  memantine (NAMENDA) 5 MG tablet Take 5 mg by mouth 2 (two) times daily.    [provider]  tamsulosin (FLOMAX) 0.4 MG CAPS capsule Take 0.4 mg by mouth at bedtime.     [provider]    Family History Family History  Problem Relation Age of Onset  . Rheum arthritis Mother   . Diabetes Mother   . Stroke Mother   . Heart attack Mother   . Kidney failure Mother   . Heart attack Father   . Heart disease Maternal Grandmother   . Rheum arthritis Maternal Grandmother   . Diabetes Maternal Grandmother   . Stroke Maternal Grandmother   . Colon cancer Neg Hx     Social History Social History   Tobacco Use  . Smoking status: Former Smoker    Packs/day: 1.00    Years: 5.00    Pack years: 5.00    Types: Cigarettes    Quit date: 11/30/1981    Years since quitting: 37.3  . Smokeless tobacco: Never Used  Substance Use Topics  .  Alcohol use: No    Alcohol/week: 0.0 standard drinks    Comment: hx abuse -- quit:  1963  . Drug use: No    Comment: hx abuse -- quit 1964 (iv heroin and cocaine     Allergies   Penicillins   Review of Systems Review of Systems  Neurological: Positive for syncope.  All other systems reviewed and are negative.    Physical Exam Updated Vital Signs BP (!) 139/99 (BP Location:  Right Arm)   Pulse 61   Temp 97.8 F (36.6 C) (Oral)   Resp 16   Ht 5\' 11"  (1.803 m)   Wt 75.8 kg   SpO2 96%   BMI 23.29 kg/m   Physical Exam Vitals signs and nursing note reviewed.  Constitutional:      Appearance: Normal appearance.  HENT:     Head: Normocephalic and atraumatic.     Right Ear: External ear normal.     Left Ear: External ear normal.     Nose: Nose normal.     Mouth/Throat:     Mouth: Mucous membranes are moist.     Pharynx: Oropharynx is clear.  Eyes:     Extraocular Movements: Extraocular movements intact.     Conjunctiva/sclera: Conjunctivae normal.     Pupils: Pupils are equal, round, and reactive to light.  Neck:     Musculoskeletal: Normal range of motion and neck supple.  Cardiovascular:     Rate and Rhythm: Normal rate and regular rhythm.     Pulses: Normal pulses.     Heart sounds: Normal heart sounds.  Pulmonary:     Effort: Pulmonary effort is normal.     Breath sounds: Normal breath sounds.  Abdominal:     General: Abdomen is flat. Bowel sounds are normal.     Palpations: Abdomen is soft.  Musculoskeletal: Normal range of motion.  Skin:    General: Skin is warm.     Capillary Refill: Capillary refill takes less than 2 seconds.  Neurological:     General: No focal deficit present.     Mental Status: He is alert and oriented to person, place, and time.  Psychiatric:        Mood and Affect: Mood normal.        Behavior: Behavior normal.        Thought Content: Thought content normal.        Judgment: Judgment normal.      ED Treatments / Results   Labs (all labs ordered are listed, but only abnormal results are displayed) Labs Reviewed  BASIC METABOLIC PANEL - Abnormal; Notable for the following components:      Result Value   GFR calc non Af Amer 56 (*)    All other components within normal limits  CBC WITH DIFFERENTIAL/PLATELET - Abnormal; Notable for the following components:   Platelets 130 (*)    All other components within normal limits  URINALYSIS, ROUTINE W REFLEX MICROSCOPIC - Abnormal; Notable for the following components:   Protein, ur 30 (*)    Leukocytes,Ua SMALL (*)    Bacteria, UA RARE (*)    All other components within normal limits  PROTIME-INR  CBG MONITORING, ED  CBG MONITORING, ED    EKG EKG Interpretation  Date/Time:  Tuesday March 29 2019 11:51:50 EDT Ventricular Rate:  59 PR Interval:    QRS Duration: 130 QT Interval:  396 QTC Calculation: 393 R Axis:   -65 Text Interpretation: Sinus rhythm Prolonged PR interval Nonspecific IVCD with LAD Anterior infarct, old Borderline T abnormalities, inferior leads No significant change since last tracing Confirmed by Isla Pence [id number]) on 03/29/2019 12:01:06 PM   Radiology Dg Chest 1 View  Result Date: 03/29/2019 CLINICAL DATA:  Syncope. EXAM: CHEST  1 VIEW COMPARISON:  Oct 14, 2018. FINDINGS: The heart size and mediastinal contours are within normal limits. Both lungs are clear. Stable elevated right hemidiaphragm. No pneumothorax or pleural effusion is noted. The visualized skeletal structures are  unremarkable. IMPRESSION: No active disease. Electronically Signed   By: Marijo Conception M.D.   On: 03/29/2019 12:53   Ct Head Wo Contrast  Result Date: 03/29/2019 CLINICAL DATA:  Four level of consciousness with recent seizure EXAM: CT HEAD WITHOUT CONTRAST TECHNIQUE: Contiguous axial images were obtained from the base of the skull through the vertex without intravenous contrast. COMPARISON:  July 08, 2017 FINDINGS: Brain: There is stable somewhat  generalized atrophy with ventricles relatively larger than sulci in proportion, stable. There is no intracranial mass, hemorrhage, extra-axial fluid collection, or midline shift. There is small vessel disease throughout the centra semiovale bilaterally with scattered lacunar type infarcts in the centra semiovale bilaterally. A small infarct in the mid left occipital lobe at the gray-white junction is stable. There is evidence of a prior infarct in the posterior limb of the right internal capsule. There is also a prior small lacunar infarct in the right thalamus. No acute infarct is demonstrable on this study. Vascular: There is no appreciable hyperdense vessel. There is calcification in the right distal vertebral artery as well as in both carotid siphon regions. Skull: The bony calvarium appears intact. Sinuses/Orbits: There is mucosal thickening in both maxillary antra with a small retention cyst in the inferior right maxillary antrum posteriorly. There is mucosal thickening in multiple ethmoid air cells. Orbits appear symmetric bilaterally. Other: Mastoids are hypoplastic, particularly on the left, but clear. 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 There is supratentorial small vessel disease with scattered lacunar type infarcts as well as a small infarct in the mid left occipital lobe at the gray-white junction. No acute infarct evident. No mass or hemorrhage. Foci of arterial vascular calcification noted. There are foci paranasal sinus disease at several sites. Electronically Signed   By: Lowella Grip III M.D.   On: 03/29/2019 13:53    Procedures Procedures (including critical care time)  Medications Ordered in ED Medications  sodium chloride 0.9 % bolus 1,000 mL (0 mLs Intravenous Stopped 03/29/19 1422)    And  0.9 %  sodium chloride infusion (has no administration in time range)     Initial Impression / Assessment and Plan / ED Course   I have reviewed the triage vital signs and the nursing notes.  Pertinent labs & imaging results that were available during my care of the patient were reviewed by me and considered in my medical decision making (see chart for details).     Pt is feeling much better.  He is told to check his bp prior to taking his meds.  He is also told to eat breakfast.  Pt is able to ambulate with his wife's cane (he uses one too, but does not have it) without problems.  Pt is stable for d/c.  Return if worse.  F/u with pcp.  Final Clinical Impressions(s) / ED Diagnoses   Final diagnoses:  Transient hypotension    ED Discharge Orders    None       Isla Pence, MD 03/29/19 1455

## 2019-03-29 NOTE — ED Notes (Signed)
Pt ambulated in the hallway with his wife's walker at a brisk pace. No assist needed.

## 2019-03-29 NOTE — ED Triage Notes (Signed)
Pt arrives via EMS from [facility] drive through where patient had brief episode of unresponsiveness. Pt not alert to name calling initially, eyes closed slumped over. Somewhat confused initally for apperox 8 min. Initial pressure 60 pal, then automatic 80 systolic, cbg [id number]. 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 Once in the truck, became alert, oriented x4, 18g LAC, 512ml NS PTA. 12 lead showed 1st degree block.

## 2019-03-31 ENCOUNTER — Ambulatory Visit: Payer: Medicare PPO | Admitting: Physical Therapy

## 2019-04-05 ENCOUNTER — Ambulatory Visit: Payer: Medicare PPO | Admitting: Physical Therapy

## 2019-04-07 ENCOUNTER — Ambulatory Visit: Payer: Medicare PPO | Admitting: Physical Therapy

## 2019-04-12 ENCOUNTER — Ambulatory Visit: Payer: Medicare PPO | Admitting: Physical Therapy

## 2019-04-14 ENCOUNTER — Ambulatory Visit: Payer: Medicare PPO | Admitting: Physical Therapy

## 2019-04-18 ENCOUNTER — Other Ambulatory Visit: Payer: Self-pay

## 2019-04-18 DIAGNOSIS — Z20822 Contact with and (suspected) exposure to covid-19: Secondary | ICD-10-CM

## 2019-04-20 LAB — NOVEL CORONAVIRUS, NAA: SARS-CoV-2, NAA: NOT DETECTED

## 2019-05-26 ENCOUNTER — Encounter: Payer: Self-pay | Admitting: Emergency Medicine

## 2019-05-26 ENCOUNTER — Ambulatory Visit
Admission: EM | Admit: 2019-05-26 | Discharge: 2019-05-26 | Disposition: A | Discharge status: Home / Self Care | Payer: Medicare PPO | Attending: Emergency Medicine | Admitting: Emergency Medicine

## 2019-05-26 ENCOUNTER — Other Ambulatory Visit: Payer: Self-pay

## 2019-05-26 DIAGNOSIS — R0789 Other chest pain: Secondary | ICD-10-CM | POA: Diagnosis not present

## 2019-05-26 NOTE — Discharge Instructions (Signed)
I agree that it seems your right-sided chest pain is musculoskeletal in origin. You may continue using aspirin and Tylenol for pain relief, though it may be helpful to try heat (hot washrag, heating pad) first to see if this helps relax her muscle. In the interim please go to the ER for worsening chest pain, difficulty breathing, lightheadedness, confusion, weakness. Recommend you follow-up with your PCP on Monday for further surveillance.

## 2019-05-26 NOTE — ED Provider Notes (Signed)
Dustin Hamilton    CSN: [id number] Arrival date & time: 05/26/19  [id number]      History   Chief Complaint Chief Complaint  Patient presents with  . Chest Pain    HPI EVANGELOS DU is a 78 y.o. male with history of CVA x3 (last 3 months ago), hypertension, and very remote history of alcohol and drug abuse (quit in the 1960s) presenting for right-sided nocturnal chest pain for the last 3 nights.  Describes as throbbing, nonradiating, brief.  States is alleviated with single dose of aspirin, Tylenol.  Patient has not tried heating pads, stretches for this.  No known injury, strain.  No associated cough, shortness of breath, lightheadedness, fever.  No known sick contacts.   Past Medical History:  Diagnosis Date  . BPH (benign prostatic hyperplasia)   . Cerebrovascular disease   . Congenital anomaly of diaphragm   . Elevated PSA   . Glaucoma, both eyes   . Hemorrhoid   . Hepatitis B surface antigen positive    02-20-2011  . History of adenomatous polyp of colon    2007, 2009 and 2013  tubular adenoma's  . History of alcohol abuse    quit 1963  . History of cerebral parenchymal hemorrhage    01/ 2006  left occiptial lobe related to hypertensive crisis  . History of CVA (cerebrovascular accident)    09-12-2012  left hippocampus/ amygdala junction and per MRI old white matter infarcts--  per pt residual short- term memory issues  . History of fatty infiltration of liver hx visit's at [facility] , last visit 05/ 2014   elvated LFT's ,  via liver bx 2004 related to hx alcohol and drug abuse (quit 1964)  . 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 HTN (hypertension)   . Renal cyst, left   . Stroke ([place])    hx of 3 strokes in past   . 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 Urethral lesion    urethral mass    Patient Active Problem List   Diagnosis Date Noted  . SVT (supraventricular tachycardia)  ([place]) 06/03/2017  . Dizziness 08/02/2014  . 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 CVA (cerebral infarction) 09/13/2012  . Vertigo 09/12/2012  . EAR PAIN 09/05/2009  . BACK PAIN 06/20/2009  . COLONIC POLYPS 05/06/2007  . 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 NONDEPENDENT ALCOHOL ABUSE IN REMISSION 05/06/2007  . HYPERTENSION, SEVERE 05/06/2007  . Hypertensive heart disease without heart failure 05/06/2007  . INTRACRANIAL HEMORRHAGE 05/06/2007  . Cerebrovascular disease 05/06/2007  . HEMORRHOIDS 05/06/2007  . INGUINAL HERNIA, RIGHT 05/06/2007  . RENAL CYST 05/06/2007  . HEMATURIA UNSPECIFIED 05/06/2007  . 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 PSA, INCREASED 05/06/2007  . LIVER FUNCTION TESTS, ABNORMAL 05/06/2007  . BPH (benign prostatic hypertrophy) with urinary obstruction 05/06/2007  . PERS HX NONCOMPLIANCE W/MED TX PRS HAZARDS HLTH 05/06/2007    Past Surgical History:  Procedure Laterality Date  . CARDIOVASCULAR STRESS TEST  05/05/2007   normal nuclear study w/ no ischemia/  normal LV fucntion and wall motion , ef60%  . COLONOSCOPY  last one 04-06-2012  . CYSTO/  LEFT RETROGRADE PYELOGRAM/ CYTOLOGY WASHINGS/  URETEROSCOPY  03/05/2000  . INGUINAL HERNIA REPAIR Bilateral 1965 and 1980's  . LAPAROSCOPIC INGUINAL HERNIA WITH UMBILICAL HERNIA Right [id number]  . LIVER BIOPSY  1980's and 2004  . SVT ABLATION N/A 11/15/2018  Procedure: SVT ABLATION;  Surgeon: Evans Lance, MD;  Location: [place] [facility];  Service: Cardiovascular;  Laterality: N/A;  . TRANSTHORACIC ECHOCARDIOGRAM  09/13/2012   moderate LVH,  ef 60-65%/    . TRANSURETHRAL RESECTION OF BLADDER TUMOR N/A 08/11/2016   Procedure: TRANSURETHRAL RESECTION OF BLADDER TUMOR (TURBT);  Surgeon: Cleon Gustin, MD;  Location: [facility];  Service: Urology;  Laterality: N/A;       Home Medications    Prior to Admission medications   Medication Sig Start Date End Date Taking?  Authorizing Provider  amLODipine (NORVASC) 5 MG tablet Take 5 mg by mouth daily.    [provider]  aspirin EC 81 MG tablet Take 81 mg by mouth daily.    [provider]  brimonidine (ALPHAGAN) 0.2 % ophthalmic solution Place 1 drop into both eyes 2 (two) times daily.    [provider]  carvedilol (COREG) 25 MG tablet Take 25 mg by mouth 2 (two) times a day.     [provider]  cetirizine (ZYRTEC) 5 MG chewable tablet Chew 1 tablet (5 mg total) by mouth daily. 06/04/18   Wurst, [place], PA-C  clopidogrel (PLAVIX) 75 MG tablet Take 1 tablet (75 mg total) by mouth daily with breakfast. 05/06/13   Noralee Space, MD  donepezil (ARICEPT) 5 MG tablet Take 5 mg by mouth at bedtime.    [provider]  finasteride (PROSCAR) 5 MG tablet Take 5 mg by mouth daily.     [provider]  latanoprost (XALATAN) 0.005 % ophthalmic solution Place 1 drop into both eyes at bedtime.     [provider]  memantine (NAMENDA) 5 MG tablet Take 5 mg by mouth 2 (two) times daily.    [provider]  tamsulosin (FLOMAX) 0.4 MG CAPS capsule Take 0.4 mg by mouth at bedtime.     [provider]    Family History Family History  Problem Relation Age of Onset  . Rheum arthritis Mother   . Diabetes Mother   . Stroke Mother   . Heart attack Mother   . Kidney failure Mother   . Heart attack Father   . Heart disease Maternal Grandmother   . Rheum arthritis Maternal Grandmother   . Diabetes Maternal Grandmother   . Stroke Maternal Grandmother   . Colon cancer Neg Hx     Social History Social History   Tobacco Use  . Smoking status: Former Smoker    Packs/day: 1.00    Years: 5.00    Pack years: 5.00    Types: Cigarettes    Quit date: 11/30/1981    Years since quitting: 37.5  . Smokeless tobacco: Never Used  Substance Use Topics  . Alcohol use: No    Alcohol/week: 0.0 standard drinks    Comment: hx abuse -- quit:  1963  . Drug  use: No    Comment: hx abuse -- quit 1964 (iv heroin and cocaine     Allergies   Penicillins   Review of Systems Review of Systems  Constitutional: Negative for fatigue and fever.  Respiratory: Negative for cough, choking, chest tightness, shortness of breath, wheezing and stridor.   Cardiovascular: Positive for chest pain. Negative for palpitations and leg swelling.  Gastrointestinal: Negative for abdominal pain, diarrhea and vomiting.  Musculoskeletal: Negative for arthralgias and myalgias.  Skin: Negative for rash and wound.  Neurological: Negative for speech difficulty and headaches.  All other systems reviewed and are negative.  Physical Exam Triage Vital Signs ED Triage Vitals  Enc Vitals Group     BP 05/26/19 0957 127/77     Pulse Rate 05/26/19 0957 69     Resp 05/26/19 0957 16     Temp 05/26/19 0957 97.8 F (36.6 C)     Temp Source 05/26/19 0957 Temporal     SpO2 05/26/19 0957 93 %     Weight --      Height --      Head Circumference --      Peak Flow --      Pain Score 05/26/19 0956 3     Pain Loc --      Pain Edu? --      Excl. in [place]? --    No data found.  Updated Vital Signs BP 127/77 (BP Location: Left Arm)   Pulse 69   Temp 97.8 F (36.6 C) (Temporal)   Resp 16   SpO2 93%   Visual Acuity Right Eye Distance:   Left Eye Distance:   Bilateral Distance:    Right Eye Near:   Left Eye Near:    Bilateral Near:     Physical Exam Constitutional:      General: He is not in acute distress.    Appearance: He is well-developed and normal weight. He is not ill-appearing or diaphoretic.  HENT:     Head: Normocephalic and atraumatic.     Mouth/Throat:     Mouth: Mucous membranes are moist.     Pharynx: Oropharynx is clear. No pharyngeal swelling or oropharyngeal exudate.  Eyes:     General: No scleral icterus.    Pupils: Pupils are equal, round, and reactive to light.  Neck:     Vascular: No JVD.     Trachea: No tracheal deviation.   Cardiovascular:     Rate and Rhythm: Normal rate and regular rhythm.     Pulses:          Radial pulses are 2+ on the right side and 2+ on the left side.     Heart sounds: Normal heart sounds.  Pulmonary:     Effort: Pulmonary effort is normal. No tachypnea or respiratory distress.     Breath sounds: No decreased breath sounds, wheezing, rhonchi or rales.  Chest:     Chest wall: No mass, deformity, tenderness or crepitus.     Breasts: Breasts are symmetrical.        Right: No swelling, bleeding, inverted nipple, mass, nipple discharge, skin change or tenderness.        Left: No swelling, bleeding, inverted nipple, mass, nipple discharge, skin change or tenderness.    Musculoskeletal:        General: Normal range of motion.     Right lower leg: No tenderness. No edema.     Left lower leg: No tenderness. No edema.  Lymphadenopathy:     Cervical: No cervical adenopathy.     Upper Body:     Right upper body: No supraclavicular adenopathy.     Left upper body: No supraclavicular adenopathy.  Skin:    General: Skin is warm.     Capillary Refill: Capillary refill takes less than 2 seconds.     Coloration: Skin is not cyanotic, jaundiced or pale.  Neurological:     General: No focal deficit present.     Mental Status: He is alert and oriented to person, place, and time.  Psychiatric:        Mood and Affect:  Mood normal.        Behavior: Behavior normal.      UC Treatments / Results  Labs (all labs ordered are listed, but only abnormal results are displayed) Labs Reviewed - No data to display  EKG   Radiology No results found.  Procedures Procedures (including critical Hamilton time)  Medications Ordered in UC Medications - No data to display  Initial Impression / Assessment and Plan / UC Course  I have reviewed the triage vital signs and the nursing notes.  Pertinent labs & imaging results that were available during my Hamilton of the patient were reviewed by me and  considered in my medical decision making (see chart for details).      EKG done in office, reviewed by me and compared to previous from 03/31/2019: Sinus rhythm with first-degree AV block, ventricular rate of 68 bpm.  No QTC prolongation, or waveform abnormality as compared to previous.  Overall stable.  Reviewed findings with patient who verbalized understanding.  Able to reproduce symptoms on exam today, and patient is asymptomatic while in office.  Likely musculoskeletal, though undetermined etiology.  Will continue ASA, APAP as needed: Encouraged patient to use hot compresses and lieu of p.o. muscle relaxers at this time.  Reviewed findings with patient's wife with husband's permission - return precautions discussed, patient & wife verbalized understanding and are agreeable to plan. Final Clinical Impressions(s) / UC Diagnoses   Final diagnoses:  Musculoskeletal chest pain     Discharge Instructions     I agree that it seems your right-sided chest pain is musculoskeletal in origin. You may continue using aspirin and Tylenol for pain relief, though it may be helpful to try heat (hot washrag, heating pad) first to see if this helps relax her muscle. In the interim please go to the ER for worsening chest pain, difficulty breathing, lightheadedness, confusion, weakness. Recommend you follow-up with your PCP on Monday for further surveillance.    ED Prescriptions    None     PDMP not reviewed this encounter.   Hall-Potvin, [place], [place] 05/26/19 1650

## 2019-05-26 NOTE — ED Triage Notes (Signed)
Pt presents to [facility] for assessment of chest pain x 3 days, worse at night.  Denies any associated symptoms, denies radiation.

## 2019-05-26 NOTE — ED Notes (Signed)
Patient able to ambulate independently  

## 2019-05-29 ENCOUNTER — Emergency Department (HOSPITAL_COMMUNITY)
Admission: EM | Admit: 2019-05-29 | Discharge: 2019-05-29 | Disposition: A | Discharge status: Home / Self Care | Payer: No Typology Code available for payment source | Attending: Emergency Medicine | Admitting: Emergency Medicine

## 2019-05-29 ENCOUNTER — Other Ambulatory Visit: Payer: Self-pay

## 2019-05-29 ENCOUNTER — Encounter (HOSPITAL_COMMUNITY): Payer: Self-pay | Admitting: Emergency Medicine

## 2019-05-29 ENCOUNTER — Emergency Department (HOSPITAL_COMMUNITY): Payer: No Typology Code available for payment source

## 2019-05-29 DIAGNOSIS — Z7982 Long term (current) use of aspirin: Secondary | ICD-10-CM | POA: Diagnosis not present

## 2019-05-29 DIAGNOSIS — Z87891 Personal history of nicotine dependence: Secondary | ICD-10-CM | POA: Diagnosis not present

## 2019-05-29 DIAGNOSIS — R0789 Other chest pain: Secondary | ICD-10-CM | POA: Diagnosis not present

## 2019-05-29 DIAGNOSIS — Z79899 Other long term (current) drug therapy: Secondary | ICD-10-CM | POA: Diagnosis not present

## 2019-05-29 DIAGNOSIS — I10 Essential (primary) hypertension: Secondary | ICD-10-CM | POA: Insufficient documentation

## 2019-05-29 DIAGNOSIS — R079 Chest pain, unspecified: Secondary | ICD-10-CM | POA: Diagnosis not present

## 2019-05-29 LAB — CBC
HCT: 47.2 % (ref 39.0–52.0)
Hemoglobin: 16.2 g/dL (ref 13.0–17.0)
MCH: 32 pg (ref 26.0–34.0)
MCHC: 34.3 g/dL (ref 30.0–36.0)
MCV: 93.1 fL (ref 80.0–100.0)
Platelets: 151 10*3/uL (ref 150–400)
RBC: 5.07 MIL/uL (ref 4.22–5.81)
RDW: 13.5 % (ref 11.5–15.5)
WBC: 5.8 10*3/uL (ref 4.0–10.5)
nRBC: 0 % (ref 0.0–0.2)

## 2019-05-29 LAB — BASIC METABOLIC PANEL
Anion gap: 6 (ref 5–15)
BUN: 15 mg/dL (ref 8–23)
CO2: 26 mmol/L (ref 22–32)
Calcium: 9.7 mg/dL (ref 8.9–10.3)
Chloride: 107 mmol/L (ref 98–111)
Creatinine, Ser: 1.06 mg/dL (ref 0.61–1.24)
GFR calc Af Amer: >60 mL/min (ref >60)
GFR calc non Af Amer: >60 mL/min (ref >60)
Glucose, Bld: 103 mg/dL — ABNORMAL HIGH (ref 70–99)
Potassium: 4 mmol/L (ref 3.5–5.1)
Sodium: 139 mmol/L (ref 135–145)

## 2019-05-29 LAB — TROPONIN I (HIGH SENSITIVITY)
Troponin I (High Sensitivity): 9 ng/L (ref <18)
Troponin I (High Sensitivity): 9 ng/L (ref <18)

## 2019-05-29 MED ORDER — SODIUM CHLORIDE 0.9% FLUSH: 3.0000 mL | Freq: Once | INTRAVENOUS | Status: DC

## 2019-05-29 NOTE — Discharge Instructions (Signed)
Please follow up with your doctor Continue Tylenol and Aspirin and heating pads for pain

## 2019-05-29 NOTE — ED Notes (Signed)
Patient Alert and oriented to baseline. Stable and ambulatory to baseline. Patient verbalized understanding of the discharge instructions.  Patient belongings were taken by the patient.   

## 2019-05-29 NOTE — ED Provider Notes (Signed)
[facility] EMERGENCY DEPARTMENT Provider Note   CSN: [id number] Arrival date & time: 05/29/19  1341     History Chief Complaint  Patient presents with  . Chest Pain    Dustin Hamilton is a 78 y.o. male with history of stroke and high blood pressure who presents with chest pain.  Patient states that for the past 4 days he has had right-sided chest pain.  Pain is constant but gets worse at nighttime when it becomes severe in nature. He states pain is nonexertional.  Pain is currently very mild. He has been taking aspirin and Tylenol for pain which has been helping.  Last night it was particularly bad and therefore he decided to come to the ED.  He went to urgent care 3 days ago and he felt pain was musculoskeletal but was told to come to the ED if worsening.  He denies fever, chills, syncope, shortness of breath, cough, back pain, abdominal pain, nausea, vomiting, leg swelling.  He states that he has to lift his wife's legs because she has lymphedema every time she gets in and out of the car.  He has had to see cardiology for SVT and went underwent an ablation in June but has not been evaluated for any coronary artery disease.   HPI   Past Medical History:  Diagnosis Date  . BPH (benign prostatic hyperplasia)   . Cerebrovascular disease   . Congenital anomaly of diaphragm   . Elevated PSA   . Glaucoma, both eyes   . Hemorrhoid   . Hepatitis B surface antigen positive    02-20-2011  . History of adenomatous polyp of colon    2007, 2009 and 2013  tubular adenoma's  . History of alcohol abuse    quit 1963  . History of cerebral parenchymal hemorrhage    01/ 2006  left occiptial lobe related to hypertensive crisis  . History of CVA (cerebrovascular accident)    09-12-2012  left hippocampus/ amygdala junction and per MRI old white matter infarcts--  per pt residual short- term memory issues  . History of fatty infiltration of liver hx visit's at [facility] ,  last visit 05/ 2014   elvated LFT's ,  via liver bx 2004 related to hx alcohol and drug abuse (quit 1964)  . 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 HTN (hypertension)   . Renal cyst, left   . Stroke ([place])    hx of 3 strokes in past   . 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 Urethral lesion    urethral mass    Patient Active Problem List   Diagnosis Date Noted  . SVT (supraventricular tachycardia) ([place]) 06/03/2017  . Dizziness 08/02/2014  . 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 CVA (cerebral infarction) 09/13/2012  . Vertigo 09/12/2012  . EAR PAIN 09/05/2009  . BACK PAIN 06/20/2009  . COLONIC POLYPS 05/06/2007  . 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 NONDEPENDENT ALCOHOL ABUSE IN REMISSION 05/06/2007  . HYPERTENSION, SEVERE 05/06/2007  . Hypertensive heart disease without heart failure 05/06/2007  . INTRACRANIAL HEMORRHAGE 05/06/2007  . Cerebrovascular disease 05/06/2007  . HEMORRHOIDS 05/06/2007  . INGUINAL HERNIA, RIGHT 05/06/2007  . RENAL CYST 05/06/2007  . HEMATURIA UNSPECIFIED 05/06/2007  . 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 PSA, INCREASED 05/06/2007  . LIVER FUNCTION TESTS, ABNORMAL 05/06/2007  . BPH (benign prostatic hypertrophy) with urinary obstruction  05/06/2007  . PERS HX NONCOMPLIANCE W/MED TX PRS HAZARDS HLTH 05/06/2007    Past Surgical History:  Procedure Laterality Date  . CARDIOVASCULAR STRESS TEST  05/05/2007   normal nuclear study w/ no ischemia/  normal LV fucntion and wall motion , ef60%  . COLONOSCOPY  last one 04-06-2012  . CYSTO/  LEFT RETROGRADE PYELOGRAM/ CYTOLOGY WASHINGS/  URETEROSCOPY  03/05/2000  . INGUINAL HERNIA REPAIR Bilateral 1965 and 1980's  . LAPAROSCOPIC INGUINAL HERNIA WITH UMBILICAL HERNIA Right [id number]  . LIVER BIOPSY  1980's and 2004  . SVT ABLATION N/A 11/15/2018   Procedure: SVT ABLATION;  Surgeon: Evans Lance, MD;  Location: [place]  CV LAB;  Service: Cardiovascular;  Laterality: N/A;  . TRANSTHORACIC ECHOCARDIOGRAM  09/13/2012   moderate LVH,  ef 60-65%/    . TRANSURETHRAL RESECTION OF BLADDER TUMOR N/A 08/11/2016   Procedure: TRANSURETHRAL RESECTION OF BLADDER TUMOR (TURBT);  Surgeon: Cleon Gustin, MD;  Location: [facility];  Service: Urology;  Laterality: N/A;       Family History  Problem Relation Age of Onset  . Rheum arthritis Mother   . Diabetes Mother   . Stroke Mother   . Heart attack Mother   . Kidney failure Mother   . Heart attack Father   . Heart disease Maternal Grandmother   . Rheum arthritis Maternal Grandmother   . Diabetes Maternal Grandmother   . Stroke Maternal Grandmother   . Colon cancer Neg Hx     Social History   Tobacco Use  . Smoking status: Former Smoker    Packs/day: 1.00    Years: 5.00    Pack years: 5.00    Types: Cigarettes    Quit date: 11/30/1981    Years since quitting: 37.5  . Smokeless tobacco: Never Used  Substance Use Topics  . Alcohol use: No    Alcohol/week: 0.0 standard drinks    Comment: hx abuse -- quit:  1963  . Drug use: No    Comment: hx abuse -- quit 1964 (iv heroin and cocaine    Home Medications Prior to Admission medications   Medication Sig Start Date End Date Taking? Authorizing Provider  amLODipine (NORVASC) 5 MG tablet Take 5 mg by mouth daily.    [provider]  aspirin EC 81 MG tablet Take 81 mg by mouth daily.    [provider]  brimonidine (ALPHAGAN) 0.2 % ophthalmic solution Place 1 drop into both eyes 2 (two) times daily.    [provider]  carvedilol (COREG) 25 MG tablet Take 25 mg by mouth 2 (two) times a day.     [provider]  cetirizine (ZYRTEC) 5 MG chewable tablet Chew 1 tablet (5 mg total) by mouth daily. 06/04/18   Wurst, [place], PA-C  clopidogrel (PLAVIX) 75 MG tablet Take 1 tablet (75 mg total) by mouth daily with breakfast. 05/06/13   Noralee Space, MD  donepezil  (ARICEPT) 5 MG tablet Take 5 mg by mouth at bedtime.    [provider]  finasteride (PROSCAR) 5 MG tablet Take 5 mg by mouth daily.     [provider]  latanoprost (XALATAN) 0.005 % ophthalmic solution Place 1 drop into both eyes at bedtime.     [provider]  memantine (NAMENDA) 5 MG tablet Take 5 mg by mouth 2 (two) times daily.    [provider]  tamsulosin (FLOMAX) 0.4 MG CAPS capsule Take 0.4 mg by mouth at bedtime.  [provider]    Allergies    Penicillins  Review of Systems   Review of Systems  Constitutional: Negative for chills and fever.  Respiratory: Negative for cough and shortness of breath.   Cardiovascular: Positive for chest pain. Negative for palpitations and leg swelling.  Gastrointestinal: Negative for abdominal pain, nausea and vomiting.  Neurological: Positive for light-headedness (Chronic). Negative for syncope.    Physical Exam Updated Vital Signs BP (!) 143/96 (BP Location: Right Arm)   Pulse 74   Temp 97.8 F (36.6 C) (Oral)   Resp 16   SpO2 98%   Physical Exam Vitals and nursing note reviewed.  Constitutional:      General: He is not in acute distress.    Appearance: He is well-developed. He is not ill-appearing.  HENT:     Head: Normocephalic and atraumatic.  Eyes:     General: No scleral icterus.       Right eye: No discharge.        Left eye: No discharge.     Conjunctiva/sclera: Conjunctivae normal.     Pupils: Pupils are equal, round, and reactive to light.  Cardiovascular:     Rate and Rhythm: Normal rate and regular rhythm.  Pulmonary:     Effort: Pulmonary effort is normal. No respiratory distress.     Breath sounds: Normal breath sounds.  Chest:     Chest wall: Tenderness (tender over the right side of the sternum) present.  Abdominal:     General: There is no distension.     Palpations: Abdomen is soft.     Tenderness: There is no abdominal tenderness.  Musculoskeletal:      Cervical back: Normal range of motion.     Right lower leg: No edema.     Left lower leg: No edema.  Skin:    General: Skin is warm and dry.  Neurological:     Mental Status: He is alert and oriented to person, place, and time.  Psychiatric:        Behavior: Behavior normal.     ED Results / Procedures / Treatments   Labs (all labs ordered are listed, but only abnormal results are displayed) Labs Reviewed  BASIC METABOLIC PANEL - Abnormal; Notable for the following components:      Result Value   Glucose, Bld 103 (*)    All other components within normal limits  CBC  TROPONIN I (HIGH SENSITIVITY)  TROPONIN I (HIGH SENSITIVITY)    EKG EKG Interpretation  Date/Time:  Sunday May 29 2019 13:53:28 EST Ventricular Rate:  80 PR Interval:  280 QRS Duration: 120 QT Interval:  370 QTC Calculation: 426 R Axis:   -60 Text Interpretation: Sinus rhythm with 1st degree A-V block Left axis deviation Anteroseptal infarct , age undetermined Abnormal ECG No significant change since last tracing Confirmed by Madalyn Rob [phone]) on 05/29/2019 3:55:05 PM   Radiology DG Chest 2 View  Result Date: 05/29/2019 CLINICAL DATA:  Right chest pain for 4 days. EXAM: CHEST - 2 VIEW COMPARISON:  March 29, 2019 FINDINGS: The heart size and mediastinal contours are within normal limits. Both lungs are clear. There is chronic elevation of right hemidiaphragm. The visualized skeletal structures are unremarkable. IMPRESSION: No active cardiopulmonary disease. Electronically Signed   By: Abelardo Diesel M.D.   On: 05/29/2019 14:40    Procedures Procedures (including critical care time)  Medications Ordered in ED Medications  sodium chloride flush (NS) 0.9 % injection 3 mL (has no  administration in time range)    ED Course  I have reviewed the triage vital signs and the nursing notes.  Pertinent labs & imaging results that were available during my care of the patient were reviewed by me and  considered in my medical decision making (see chart for details).  Chest pain work up is reassuring. Doubt ACS, PE, pericarditis, esophageal rupture, tension pneumothorax, aortic dissection, cardiac tamponade. Likely MSK vs other atypical etiology. EKG is SR with 1st degree AV block and shows no significant change since last. CXR is negative. Initial and second troponin is 9. Labs are unremarkable. Shared visit with Dr. Roslynn Amble. Advised continuing conservative management. Will d/c with PCP f/u.   MDM Rules/Calculators/A&P                       Final Clinical Impression(s) / ED Diagnoses Final diagnoses:  Atypical chest pain    Rx / DC Orders ED Discharge Orders    None       Recardo Evangelist, PA-C 05/29/19 2007    Lucrezia Starch, MD 05/30/19 [phone]

## 2019-05-29 NOTE — ED Triage Notes (Signed)
Pt reports R sided chest pain x 4 days that is worse at night.  Pt seen at [facility] on 12/24 for chest pain that was reported for 3 days at that time.  Denies SOB, nausea, and vomiting.

## 2019-06-13 DIAGNOSIS — N183 Chronic kidney disease, stage 3 unspecified: Secondary | ICD-10-CM | POA: Diagnosis not present

## 2019-06-13 DIAGNOSIS — I10 Essential (primary) hypertension: Secondary | ICD-10-CM | POA: Diagnosis not present

## 2019-06-15 DIAGNOSIS — I10 Essential (primary) hypertension: Secondary | ICD-10-CM | POA: Diagnosis not present

## 2019-06-15 DIAGNOSIS — N390 Urinary tract infection, site not specified: Secondary | ICD-10-CM | POA: Diagnosis not present

## 2019-07-28 DIAGNOSIS — I10 Essential (primary) hypertension: Secondary | ICD-10-CM | POA: Diagnosis not present

## 2019-07-28 DIAGNOSIS — N182 Chronic kidney disease, stage 2 (mild): Secondary | ICD-10-CM | POA: Diagnosis not present

## 2019-08-02 DIAGNOSIS — N390 Urinary tract infection, site not specified: Secondary | ICD-10-CM | POA: Diagnosis not present

## 2019-08-02 DIAGNOSIS — N183 Chronic kidney disease, stage 3 unspecified: Secondary | ICD-10-CM | POA: Diagnosis not present

## 2019-08-02 DIAGNOSIS — I10 Essential (primary) hypertension: Secondary | ICD-10-CM | POA: Diagnosis not present

## 2019-09-09 DIAGNOSIS — I10 Essential (primary) hypertension: Secondary | ICD-10-CM | POA: Diagnosis not present

## 2019-09-09 DIAGNOSIS — E213 Hyperparathyroidism, unspecified: Secondary | ICD-10-CM | POA: Diagnosis not present

## 2019-09-09 DIAGNOSIS — E559 Vitamin D deficiency, unspecified: Secondary | ICD-10-CM | POA: Diagnosis not present

## 2019-09-09 DIAGNOSIS — N183 Chronic kidney disease, stage 3 unspecified: Secondary | ICD-10-CM | POA: Diagnosis not present

## 2019-09-13 DIAGNOSIS — I10 Essential (primary) hypertension: Secondary | ICD-10-CM | POA: Diagnosis not present

## 2019-09-13 DIAGNOSIS — N183 Chronic kidney disease, stage 3 unspecified: Secondary | ICD-10-CM | POA: Diagnosis not present

## 2019-09-13 DIAGNOSIS — N390 Urinary tract infection, site not specified: Secondary | ICD-10-CM | POA: Diagnosis not present

## 2019-09-17 ENCOUNTER — Emergency Department (HOSPITAL_COMMUNITY)
Admission: EM | Admit: 2019-09-17 | Discharge: 2019-09-17 | Disposition: A | Discharge status: Home / Self Care | Payer: No Typology Code available for payment source | Attending: Emergency Medicine | Admitting: Emergency Medicine

## 2019-09-17 ENCOUNTER — Emergency Department (HOSPITAL_COMMUNITY): Payer: No Typology Code available for payment source

## 2019-09-17 ENCOUNTER — Other Ambulatory Visit: Payer: Self-pay

## 2019-09-17 DIAGNOSIS — Z7982 Long term (current) use of aspirin: Secondary | ICD-10-CM | POA: Insufficient documentation

## 2019-09-17 DIAGNOSIS — I10 Essential (primary) hypertension: Secondary | ICD-10-CM | POA: Insufficient documentation

## 2019-09-17 DIAGNOSIS — Z79899 Other long term (current) drug therapy: Secondary | ICD-10-CM | POA: Diagnosis not present

## 2019-09-17 DIAGNOSIS — R009 Unspecified abnormalities of heart beat: Secondary | ICD-10-CM | POA: Diagnosis not present

## 2019-09-17 DIAGNOSIS — Z8673 Personal history of transient ischemic attack (TIA), and cerebral infarction without residual deficits: Secondary | ICD-10-CM | POA: Insufficient documentation

## 2019-09-17 DIAGNOSIS — I447 Left bundle-branch block, unspecified: Secondary | ICD-10-CM | POA: Diagnosis not present

## 2019-09-17 DIAGNOSIS — Z87891 Personal history of nicotine dependence: Secondary | ICD-10-CM | POA: Insufficient documentation

## 2019-09-17 DIAGNOSIS — R55 Syncope and collapse: Secondary | ICD-10-CM | POA: Insufficient documentation

## 2019-09-17 DIAGNOSIS — R0902 Hypoxemia: Secondary | ICD-10-CM | POA: Diagnosis not present

## 2019-09-17 DIAGNOSIS — N390 Urinary tract infection, site not specified: Secondary | ICD-10-CM

## 2019-09-17 DIAGNOSIS — I499 Cardiac arrhythmia, unspecified: Secondary | ICD-10-CM | POA: Diagnosis not present

## 2019-09-17 DIAGNOSIS — Z7902 Long term (current) use of antithrombotics/antiplatelets: Secondary | ICD-10-CM | POA: Insufficient documentation

## 2019-09-17 DIAGNOSIS — I440 Atrioventricular block, first degree: Secondary | ICD-10-CM | POA: Diagnosis not present

## 2019-09-17 DIAGNOSIS — R4182 Altered mental status, unspecified: Secondary | ICD-10-CM | POA: Diagnosis not present

## 2019-09-17 LAB — CBC WITH DIFFERENTIAL/PLATELET
Abs Immature Granulocytes: 0.03 10*3/uL (ref 0.00–0.07)
Basophils Absolute: 0 10*3/uL (ref 0.0–0.1)
Basophils Relative: 0 %
Eosinophils Absolute: 0.1 10*3/uL (ref 0.0–0.5)
Eosinophils Relative: 2 %
HCT: 51.1 % (ref 39.0–52.0)
Hemoglobin: 17 g/dL (ref 13.0–17.0)
Immature Granulocytes: 0 %
Lymphocytes Relative: 12 %
Lymphs Abs: 0.8 10*3/uL (ref 0.7–4.0)
MCH: 31.5 pg (ref 26.0–34.0)
MCHC: 33.3 g/dL (ref 30.0–36.0)
MCV: 94.6 fL (ref 80.0–100.0)
Monocytes Absolute: 0.4 10*3/uL (ref 0.1–1.0)
Monocytes Relative: 5 %
Neutro Abs: 5.7 10*3/uL (ref 1.7–7.7)
Neutrophils Relative %: 81 %
Platelets: 144 10*3/uL — ABNORMAL LOW (ref 150–400)
RBC: 5.4 MIL/uL (ref 4.22–5.81)
RDW: 13.9 % (ref 11.5–15.5)
WBC: 7.1 10*3/uL (ref 4.0–10.5)
nRBC: 0 % (ref 0.0–0.2)

## 2019-09-17 LAB — URINALYSIS, ROUTINE W REFLEX MICROSCOPIC
Glucose, UA: NEGATIVE mg/dL
Ketones, ur: NEGATIVE mg/dL
Nitrite: NEGATIVE
Protein, ur: 100 mg/dL — AB
Specific Gravity, Urine: 1.02 (ref 1.005–1.030)
WBC, UA: >50 WBC/hpf — ABNORMAL HIGH (ref 0–5)
pH: 5 (ref 5.0–8.0)

## 2019-09-17 LAB — COMPREHENSIVE METABOLIC PANEL
ALT: 25 U/L (ref 0–44)
AST: 25 U/L (ref 15–41)
Albumin: 4 g/dL (ref 3.5–5.0)
Alkaline Phosphatase: 56 U/L (ref 38–126)
Anion gap: 10 (ref 5–15)
BUN: 18 mg/dL (ref 8–23)
CO2: 22 mmol/L (ref 22–32)
Calcium: 9.7 mg/dL (ref 8.9–10.3)
Chloride: 108 mmol/L (ref 98–111)
Creatinine, Ser: 1.21 mg/dL (ref 0.61–1.24)
GFR calc Af Amer: >60 mL/min (ref >60)
GFR calc non Af Amer: 57 mL/min — ABNORMAL LOW (ref >60)
Glucose, Bld: 105 mg/dL — ABNORMAL HIGH (ref 70–99)
Potassium: 4.3 mmol/L (ref 3.5–5.1)
Sodium: 140 mmol/L (ref 135–145)
Total Bilirubin: 0.8 mg/dL (ref 0.3–1.2)
Total Protein: 7.9 g/dL (ref 6.5–8.1)

## 2019-09-17 LAB — CBG MONITORING, ED: Glucose-Capillary: 94 mg/dL (ref 70–99)

## 2019-09-17 MED ORDER — CEPHALEXIN 250 MG PO CAPS
500.0000 mg | ORAL_CAPSULE | Freq: Once | ORAL | Status: AC
  Administered 2019-09-17: 500 mg via ORAL
  Filled 2019-09-17: qty 2

## 2019-09-17 MED ORDER — AZITHROMYCIN 250 MG PO TABS: 250.0000 mg | ORAL_TABLET | Freq: Every day | ORAL | 0 refills | Status: DC

## 2019-09-17 MED ORDER — IOHEXOL 350 MG/ML SOLN
100.0000 mL | Freq: Once | INTRAVENOUS | Status: AC | PRN
  Administered 2019-09-17: 100 mL via INTRAVENOUS

## 2019-09-17 MED ORDER — CEPHALEXIN 500 MG PO CAPS: 500.0000 mg | ORAL_CAPSULE | Freq: Two times a day (BID) | ORAL | 0 refills | Status: DC

## 2019-09-17 MED ORDER — AZITHROMYCIN 250 MG PO TABS
500.0000 mg | ORAL_TABLET | Freq: Once | ORAL | Status: AC
  Administered 2019-09-17: 500 mg via ORAL
  Filled 2019-09-17: qty 2

## 2019-09-17 NOTE — ED Notes (Signed)
Discharge instructions reviewed with pt. Pt verbalized understanding.   

## 2019-09-17 NOTE — ED Notes (Signed)
Pt ambulated fine... 

## 2019-09-17 NOTE — Discharge Instructions (Addendum)
You had a CT scan of your head, chest, abdomen and pelvis performed in the Emergency Department today. The scan showed a possible pneumonia as well as enlargement of your prostate gland.  Please follow up with your family doctor for recheck and further evaluation.

## 2019-09-17 NOTE — ED Provider Notes (Signed)
[facility] EMERGENCY DEPARTMENT Provider Note   CSN: [id number] Arrival date & time: 09/17/19  1821     History Chief Complaint  Patient presents with  . Near Syncope    Dustin Hamilton is a 79 y.o. male.  The history is provided by the patient, medical records and the EMS personnel. No language interpreter was used.   Dustin Hamilton is a 79 y.o. male who presents to the Emergency Department complaining of AMS. Level V caveat due to confusion. Today he was working out in the yard and went inside to take a nap. When he woke up he went to have a bowel movement and then shortly thereafter became confused, diaphoretic and hypotensive. The episode lasted 3 to 5 minutes. EMS reports pressure of 96/68. Patient denies any reports of pain. He does not recall what occurred earlier. Denies any recent illnesses. Denies fevers, nausea, vomiting, chest pain, shortness of breath, black or bloody stools.    Past Medical History:  Diagnosis Date  . BPH (benign prostatic hyperplasia)   . Cerebrovascular disease   . Congenital anomaly of diaphragm   . Elevated PSA   . Glaucoma, both eyes   . Hemorrhoid   . Hepatitis B surface antigen positive    02-20-2011  . History of adenomatous polyp of colon    2007, 2009 and 2013  tubular adenoma's  . History of alcohol abuse    quit 1963  . History of cerebral parenchymal hemorrhage    01/ 2006  left occiptial lobe related to hypertensive crisis  . History of CVA (cerebrovascular accident)    09-12-2012  left hippocampus/ amygdala junction and per MRI old white matter infarcts--  per pt residual short- term memory issues  . History of fatty infiltration of liver hx visit's at [facility] , last visit 05/ 2014   elvated LFT's ,  via liver bx 2004 related to hx alcohol and drug abuse (quit 1964)  . 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 HTN (hypertension)   . Renal cyst, left   .  Stroke ([place])    hx of 3 strokes in past   . 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 Urethral lesion    urethral mass    Patient Active Problem List   Diagnosis Date Noted  . SVT (supraventricular tachycardia) ([place]) 06/03/2017  . Dizziness 08/02/2014  . 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 CVA (cerebral infarction) 09/13/2012  . Vertigo 09/12/2012  . EAR PAIN 09/05/2009  . BACK PAIN 06/20/2009  . COLONIC POLYPS 05/06/2007  . 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 NONDEPENDENT ALCOHOL ABUSE IN REMISSION 05/06/2007  . HYPERTENSION, SEVERE 05/06/2007  . Hypertensive heart disease without heart failure 05/06/2007  . INTRACRANIAL HEMORRHAGE 05/06/2007  . Cerebrovascular disease 05/06/2007  . HEMORRHOIDS 05/06/2007  . INGUINAL HERNIA, RIGHT 05/06/2007  . RENAL CYST 05/06/2007  . HEMATURIA UNSPECIFIED 05/06/2007  . 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 PSA, INCREASED 05/06/2007  . LIVER FUNCTION TESTS, ABNORMAL 05/06/2007  . BPH (benign prostatic hypertrophy) with urinary obstruction 05/06/2007  . PERS HX NONCOMPLIANCE W/MED TX PRS HAZARDS HLTH 05/06/2007    Past Surgical History:  Procedure Laterality Date  . CARDIOVASCULAR STRESS TEST  05/05/2007   normal nuclear study w/ no ischemia/  normal LV fucntion and wall motion , ef60%  . COLONOSCOPY  last one 04-06-2012  . CYSTO/  LEFT  RETROGRADE PYELOGRAM/ CYTOLOGY WASHINGS/  URETEROSCOPY  03/05/2000  . INGUINAL HERNIA REPAIR Bilateral 1965 and 1980's  . LAPAROSCOPIC INGUINAL HERNIA WITH UMBILICAL HERNIA Right [id number]  . LIVER BIOPSY  1980's and 2004  . SVT ABLATION N/A 11/15/2018   Procedure: SVT ABLATION;  Surgeon: Evans Lance, MD;  Location: [facility];  Service: Cardiovascular;  Laterality: N/A;  . TRANSTHORACIC ECHOCARDIOGRAM  09/13/2012   moderate LVH,  ef 60-65%/    . TRANSURETHRAL RESECTION OF BLADDER TUMOR N/A 08/11/2016   Procedure: TRANSURETHRAL RESECTION OF BLADDER  TUMOR (TURBT);  Surgeon: Cleon Gustin, MD;  Location: [facility];  Service: Urology;  Laterality: N/A;       Family History  Problem Relation Age of Onset  . Rheum arthritis Mother   . Diabetes Mother   . Stroke Mother   . Heart attack Mother   . Kidney failure Mother   . Heart attack Father   . Heart disease Maternal Grandmother   . Rheum arthritis Maternal Grandmother   . Diabetes Maternal Grandmother   . Stroke Maternal Grandmother   . Colon cancer Neg Hx     Social History   Tobacco Use  . Smoking status: Former Smoker    Packs/day: 1.00    Years: 5.00    Pack years: 5.00    Types: Cigarettes    Quit date: 11/30/1981    Years since quitting: 37.8  . Smokeless tobacco: Never Used  Substance Use Topics  . Alcohol use: No    Alcohol/week: 0.0 standard drinks    Comment: hx abuse -- quit:  1963  . Drug use: No    Comment: hx abuse -- quit 1964 (iv heroin and cocaine    Home Medications Prior to Admission medications   Medication Sig Start Date End Date Taking? Authorizing Provider  amLODipine (NORVASC) 5 MG tablet Take 5 mg by mouth daily.    [provider]  aspirin EC 81 MG tablet Take 81 mg by mouth daily.    [provider]  azithromycin (ZITHROMAX) 250 MG tablet Take 1 tablet (250 mg total) by mouth daily. Take one tablet daily 09/17/19   Quintella Reichert, MD  brimonidine [facility]) 0.2 % ophthalmic solution Place 1 drop into both eyes 2 (two) times daily.    [provider]  carvedilol (COREG) 25 MG tablet Take 25 mg by mouth 2 (two) times a day.     [provider]  cephALEXin (KEFLEX) 500 MG capsule Take 1 capsule (500 mg total) by mouth 2 (two) times daily. 09/17/19   Quintella Reichert, MD  cetirizine (ZYRTEC) 5 MG chewable tablet Chew 1 tablet (5 mg total) by mouth daily. 06/04/18   Wurst, [place], PA-C  clopidogrel (PLAVIX) 75 MG tablet Take 1 tablet (75 mg total) by mouth daily with breakfast. 05/06/13    Noralee Space, MD  donepezil (ARICEPT) 5 MG tablet Take 5 mg by mouth at bedtime.    [provider]  finasteride (PROSCAR) 5 MG tablet Take 5 mg by mouth daily.     [provider]  latanoprost (XALATAN) 0.005 % ophthalmic solution Place 1 drop into both eyes at bedtime.     [provider]  memantine (NAMENDA) 5 MG tablet Take 5 mg by mouth 2 (two) times daily.    [provider]  tamsulosin (FLOMAX) 0.4 MG CAPS capsule Take 0.4 mg by mouth at bedtime.     [provider]    Allergies  Penicillins  Review of Systems   Review of Systems  All other systems reviewed and are negative.   Physical Exam Updated Vital Signs BP 133/79   Pulse (!) 59   Temp 97.8 F (36.6 C) (Oral)   Resp 16   Ht 5\' 11"  (1.803 m)   Wt 76.2 kg   SpO2 92%   BMI 23.43 kg/m   Physical Exam Vitals and nursing note reviewed.  Constitutional:      Appearance: He is well-developed.  HENT:     Head: Normocephalic and atraumatic.  Cardiovascular:     Rate and Rhythm: Normal rate and regular rhythm.     Heart sounds: No murmur.  Pulmonary:     Effort: Pulmonary effort is normal. No respiratory distress.     Breath sounds: Normal breath sounds.  Abdominal:     Palpations: Abdomen is soft.     Tenderness: There is no abdominal tenderness. There is no guarding or rebound.  Musculoskeletal:        General: No swelling or tenderness.     Comments: 2+ radial and femoral pulses bilaterally  Skin:    General: Skin is warm and dry.  Neurological:     Mental Status: He is alert and oriented to person, place, and time.     Comments: No asymmetry of facial movement.  5/5 strength in all four extremities with sensation to light touch intact in all four extremities  Psychiatric:        Behavior: Behavior normal.     ED Results / Procedures / Treatments   Labs (all labs ordered are listed, but only abnormal results are displayed) Labs Reviewed  COMPREHENSIVE  METABOLIC PANEL - Abnormal; Notable for the following components:      Result Value   Glucose, Bld 105 (*)    GFR calc non Af Amer 57 (*)    All other components within normal limits  CBC WITH DIFFERENTIAL/PLATELET - Abnormal; Notable for the following components:   Platelets 144 (*)    All other components within normal limits  URINALYSIS, ROUTINE W REFLEX MICROSCOPIC - Abnormal; Notable for the following components:   Color, Urine AMBER (*)    APPearance CLOUDY (*)    Hgb urine dipstick SMALL (*)    Bilirubin Urine SMALL (*)    Protein, ur 100 (*)    Leukocytes,Ua LARGE (*)    WBC, UA >50 (*)    Bacteria, UA RARE (*)    All other components within normal limits  URINE CULTURE  CBG MONITORING, ED    EKG None  Radiology CT Head Wo Contrast  Result Date: 09/17/2019 CLINICAL DATA:  Altered mental status. Syncopal episode. EXAM: CT HEAD WITHOUT CONTRAST TECHNIQUE: Contiguous axial images were obtained from the base of the skull through the vertex without intravenous contrast. COMPARISON:  03/29/2019 FINDINGS: Brain: Stable moderately enlarged ventricles and subarachnoid spaces. Stable moderate patchy white matter low density in both cerebral hemispheres. Stable multiple small bilateral white matter lacunar infarcts. No intracranial hemorrhage, mass lesion or CT evidence of acute infarction. Vascular: No hyperdense vessel or unexpected calcification. Skull: Normal. Negative for fracture or focal lesion. Sinuses/Orbits: Unremarkable. Other: None. IMPRESSION: 1. No acute abnormality. 2. Stable atrophy, chronic small vessel white matter ischemic changes and multiple small bilateral white matter lacunar infarcts. Electronically Signed   By: Claudie Revering M.D.   On: 09/17/2019 19:05   CT Angio Chest/Abd/Pel for Dissection W and/or W/WO  Result Date: 09/17/2019 CLINICAL DATA:  79 year old male  with altered mental status and hypotension. Asymmetric pulses. EXAM: CT ANGIOGRAPHY CHEST, ABDOMEN AND  PELVIS TECHNIQUE: Non-contrast CT of the chest was initially obtained. Multidetector CT imaging through the chest, abdomen and pelvis was performed using the standard protocol during bolus administration of intravenous contrast. Multiplanar reconstructed images and MIPs were obtained and reviewed to evaluate the vascular anatomy. CONTRAST:  174mL OMNIPAQUE IOHEXOL 350 MG/ML SOLN COMPARISON:  CT abdomen pelvis dated 08/05/2018 and chest radiograph dated 05/29/2019. FINDINGS: CTA CHEST FINDINGS Cardiovascular: There is no cardiomegaly or pericardial effusion. The thoracic aorta is unremarkable. The origins of the great vessels of the aortic arch appear patent. The central pulmonary arteries appear unremarkable. Mediastinum/Nodes: Top-normal right hilar lymph nodes, likely reactive. No mediastinal adenopathy. The esophagus is grossly unremarkable. Several subcentimeter thyroid nodules. Not clinically significant; no follow-up imaging recommended (ref: J Am Coll Radiol. 2015 Feb;12(2): 143-50).No mediastinal fluid collection or hematoma. Lungs/Pleura: There is mild eventration of the right hemidiaphragm. Patchy area of airspace opacity with bronchogram in the right lower lobe may represent compressive atelectasis although pneumonia is not excluded. Clinical correlation is recommended. There is no pleural effusion or pneumothorax. The central airways are patent. Musculoskeletal: Degenerative changes of the spine. No acute osseous pathology. Review of the MIP images confirms the above findings. CTA ABDOMEN AND PELVIS FINDINGS VASCULAR Aorta: Normal caliber aorta without aneurysm, dissection, vasculitis or significant stenosis. Celiac: Patent without evidence of aneurysm, dissection, vasculitis or significant stenosis. SMA: Patent without evidence of aneurysm, dissection, vasculitis or significant stenosis. Renals: Both renal arteries are patent without evidence of aneurysm, dissection, vasculitis, fibromuscular dysplasia  or significant stenosis. IMA: Patent without evidence of aneurysm, dissection, vasculitis or significant stenosis. Inflow: Mild atherosclerotic calcification. No dissection or aneurysm. The iliac arteries are patent. Veins: No obvious venous abnormality within the limitations of this arterial phase study. Review of the MIP images confirms the above findings. NON-VASCULAR No intra-abdominal free air or free fluid. Hepatobiliary: Probable mild fatty infiltration of the liver. No intrahepatic biliary ductal dilatation. The gallbladder is unremarkable. Pancreas: Unremarkable. No pancreatic ductal dilatation or surrounding inflammatory changes. Spleen: Normal in size without focal abnormality. Adrenals/Urinary Tract: Indeterminate 12 mm right adrenal nodule. The left adrenal gland is unremarkable. There is no hydronephrosis on either side. There is a 5.5 cm left renal inferior pole cyst. Additional subcentimeter hypodense lesion in the lower pole of the right kidney is not well characterized but appears similar to prior CT. The visualized ureters appear unremarkable. The urinary bladder is minimally distended and grossly unremarkable. Stomach/Bowel: Mild thickened appearance of the transverse and proximal descending colon, likely related to underdistention. Mild colitis is less likely. Clinical correlation is recommended. There is no bowel obstruction. The appendix is normal. Lymphatic: No adenopathy. Reproductive: Enlarged prostate gland with median lobe hypertrophy indenting the base of the bladder. The prostate measures approximately 7 cm in transverse axial diameter. Other: None Musculoskeletal: Degenerative changes of the spine. No acute osseous pathology. Review of the MIP images confirms the above findings. IMPRESSION: 1. No aortic aneurysm or dissection. 2. Patchy area of airspace opacity with bronchogram in the right lower lobe may represent compressive atelectasis. Pneumonia is not excluded. Clinical  correlation is recommended. 3. Mild thickened appearance of the transverse and proximal descending colon, likely related to underdistention. Mild colitis is less likely. Clinical correlation is recommended. No bowel obstruction. Normal appendix. 4. Enlarged prostate gland with median lobe hypertrophy indenting the base of the bladder. Electronically Signed   By: Laren Everts.D.  On: 09/17/2019 21:02    Procedures Procedures (including critical care time)  Medications Ordered in ED Medications  cephALEXin (KEFLEX) capsule 500 mg (has no administration in time range)  azithromycin (ZITHROMAX) tablet 500 mg (has no administration in time range)  iohexol (OMNIPAQUE) 350 MG/ML injection 100 mL (100 mLs Intravenous Contrast Given 09/17/19 2029)    ED Course  I have reviewed the triage vital signs and the nursing notes.  Pertinent labs & imaging results that were available during my care of the patient were reviewed by me and considered in my medical decision making (see chart for details).    MDM Rules/Calculators/A&P                     Pt here for evaluation following transient episode of altered mental status with blood pressures in the 90s. Episode lasted 3 to 5 minutes and it occurred after a bowel movement. On ED evaluation he is completely asymptomatic. EMS reported transient decrease pulses in the left upper extremity. He has symmetric pulses on examination. CTA was obtained due to this pulse discrepancy per EMS. CTA without evidence of dissection. UA is concerning for UTI, will send cultures and start antibiotics. CT does show possible pneumonia, will add coverage for atypical infection. Discussed with patient and wife findings of studies. Patient continues to be asymptomatic on ED evaluation. Plan to discharge home with outpatient follow-up and return precautions.  Final Clinical Impression(s) / ED Diagnoses Final diagnoses:  Near syncope  Acute UTI    Rx / DC Orders ED  Discharge Orders         Ordered    cephALEXin (KEFLEX) 500 MG capsule  2 times daily     09/17/19 2120    azithromycin (ZITHROMAX) 250 MG tablet  Daily     09/17/19 2121           Quintella Reichert, MD 09/17/19 2133

## 2019-09-17 NOTE — ED Triage Notes (Signed)
Pt presents from home for near syncope after bowel movement. After BM, was not following directions for about 3 min but became A&Ox4 with EMS, VAN neg at that time. Denies falls.  Earlier today [place] noted weaker L radial pulse, same noted by EMS along with decreased BP compared to R.  H/o CVA (takes plavix)

## 2019-09-19 LAB — URINE CULTURE

## 2019-09-28 DIAGNOSIS — R310 Gross hematuria: Secondary | ICD-10-CM | POA: Diagnosis not present

## 2019-09-28 DIAGNOSIS — R3915 Urgency of urination: Secondary | ICD-10-CM | POA: Diagnosis not present

## 2019-09-28 DIAGNOSIS — N401 Enlarged prostate with lower urinary tract symptoms: Secondary | ICD-10-CM | POA: Diagnosis not present

## 2019-12-04 ENCOUNTER — Emergency Department (HOSPITAL_COMMUNITY): Payer: No Typology Code available for payment source

## 2019-12-04 ENCOUNTER — Emergency Department (HOSPITAL_COMMUNITY)
Admission: EM | Admit: 2019-12-04 | Discharge: 2019-12-04 | Disposition: A | Discharge status: Home / Self Care | Payer: No Typology Code available for payment source | Attending: Emergency Medicine | Admitting: Emergency Medicine

## 2019-12-04 ENCOUNTER — Other Ambulatory Visit: Payer: Self-pay

## 2019-12-04 DIAGNOSIS — Z7982 Long term (current) use of aspirin: Secondary | ICD-10-CM | POA: Insufficient documentation

## 2019-12-04 DIAGNOSIS — R0789 Other chest pain: Secondary | ICD-10-CM | POA: Diagnosis not present

## 2019-12-04 DIAGNOSIS — R079 Chest pain, unspecified: Secondary | ICD-10-CM

## 2019-12-04 DIAGNOSIS — I959 Hypotension, unspecified: Secondary | ICD-10-CM

## 2019-12-04 DIAGNOSIS — I10 Essential (primary) hypertension: Secondary | ICD-10-CM | POA: Diagnosis not present

## 2019-12-04 DIAGNOSIS — R55 Syncope and collapse: Secondary | ICD-10-CM | POA: Insufficient documentation

## 2019-12-04 DIAGNOSIS — Z87891 Personal history of nicotine dependence: Secondary | ICD-10-CM | POA: Diagnosis not present

## 2019-12-04 DIAGNOSIS — Z923 Personal history of irradiation: Secondary | ICD-10-CM

## 2019-12-04 DIAGNOSIS — Z79899 Other long term (current) drug therapy: Secondary | ICD-10-CM | POA: Diagnosis not present

## 2019-12-04 DIAGNOSIS — M546 Pain in thoracic spine: Secondary | ICD-10-CM

## 2019-12-04 LAB — COMPREHENSIVE METABOLIC PANEL
ALT: 20 U/L (ref 0–44)
AST: 19 U/L (ref 15–41)
Albumin: 3.6 g/dL (ref 3.5–5.0)
Alkaline Phosphatase: 52 U/L (ref 38–126)
Anion gap: 8 (ref 5–15)
BUN: 19 mg/dL (ref 8–23)
CO2: 23 mmol/L (ref 22–32)
Calcium: 9.7 mg/dL (ref 8.9–10.3)
Chloride: 111 mmol/L (ref 98–111)
Creatinine, Ser: 1.26 mg/dL — ABNORMAL HIGH (ref 0.61–1.24)
GFR calc Af Amer: >60 mL/min (ref >60)
GFR calc non Af Amer: 54 mL/min — ABNORMAL LOW (ref >60)
Glucose, Bld: 137 mg/dL — ABNORMAL HIGH (ref 70–99)
Potassium: 3.6 mmol/L (ref 3.5–5.1)
Sodium: 142 mmol/L (ref 135–145)
Total Bilirubin: 0.7 mg/dL (ref 0.3–1.2)
Total Protein: 7.2 g/dL (ref 6.5–8.1)

## 2019-12-04 LAB — CBC
HCT: 47.6 % (ref 39.0–52.0)
Hemoglobin: 15.6 g/dL (ref 13.0–17.0)
MCH: 30.8 pg (ref 26.0–34.0)
MCHC: 32.8 g/dL (ref 30.0–36.0)
MCV: 93.9 fL (ref 80.0–100.0)
Platelets: 120 10*3/uL — ABNORMAL LOW (ref 150–400)
RBC: 5.07 MIL/uL (ref 4.22–5.81)
RDW: 14 % (ref 11.5–15.5)
WBC: 4.4 10*3/uL (ref 4.0–10.5)
nRBC: 0 % (ref 0.0–0.2)

## 2019-12-04 LAB — TSH: TSH: 0.833 u[IU]/mL (ref 0.350–4.500)

## 2019-12-04 LAB — D-DIMER, QUANTITATIVE: D-Dimer, Quant: 0.81 ug/mL-FEU — ABNORMAL HIGH (ref 0.00–0.50)

## 2019-12-04 LAB — TROPONIN I (HIGH SENSITIVITY)
Troponin I (High Sensitivity): 10 ng/L (ref <18)
Troponin I (High Sensitivity): 8 ng/L (ref <18)

## 2019-12-04 LAB — T4, FREE: Free T4: 0.91 ng/dL (ref 0.61–1.12)

## 2019-12-04 LAB — CBG MONITORING, ED: Glucose-Capillary: 127 mg/dL — ABNORMAL HIGH (ref 70–99)

## 2019-12-04 MED ORDER — IOHEXOL 350 MG/ML SOLN
100.0000 mL | Freq: Once | INTRAVENOUS | Status: AC | PRN
  Administered 2019-12-04: 65 mL via INTRAVENOUS

## 2019-12-04 MED ORDER — SODIUM CHLORIDE 0.9 % IV BOLUS
1000.0000 mL | Freq: Once | INTRAVENOUS | Status: AC
  Administered 2019-12-04: 1000 mL via INTRAVENOUS

## 2019-12-04 NOTE — ED Provider Notes (Signed)
[facility] Provider Note   CSN: [id number] Arrival date & time: 12/04/19  1123     History Chief Complaint  Patient presents with  . Loss of Consciousness  . Chest Pain    Dustin Hamilton is a 79 y.o. male.  HPI  Patient is a 79 year old gentleman with past history significant for stroke, SVT s/p ablation 2020.  Normal echo EF 2019, BPH, hemorrhoids, hypertension  Patient is presented today with syncope/near syncope.  This occurred just prior to arrival in the ER today.  According to him he was eating breakfast this morning and felt peripherally well he went to the kitchen and sat in a chair and states he does not remember what happened next.  He denies any prodromal symptoms including headache, chest pain, neck pain, abdominal pain.  He states that he members regaining consciousness when his wife said his name.  He states that when he did regain consciousness he felt some mid back pain.  He states that this resolved during the EMS ride.   Patient states he has no symptoms currently.  Is feeling well.   Per EMS patient was given aspirin 324, 250 normal saline bolus and had soft blood pressures with 76B systolic during transport.  He was alert and oriented and mentating well during transport however.  Patient states that his back pain which is now resolved was achy and mild.  He states no chest pain or back pain currently.  Per EMS report patient was endorsing central chest pain in route however he states that he had no chest pain.  Discussed with patient's significant other want to.  She confirmed that patient has been on 2 blood pressure medications and tamsulosin with no changes in his prescription or his use of the medications for quite some time.  She states that he has frequent near syncopal episodes.  She states that Thursday he had a near syncopal episode EMS evaluated him he declined transport at that time.  She states that he has seemed  more weak since that time.  He does not seem to have any focal weakness but just been generalized weak.  She states that he occasionally coughs but is not been coughing more than usual.  Denies any fevers.    Past Medical History:  Diagnosis Date  . BPH (benign prostatic hyperplasia)   . Cerebrovascular disease   . Congenital anomaly of diaphragm   . Elevated PSA   . Glaucoma, both eyes   . Hemorrhoid   . Hepatitis B surface antigen positive    02-20-2011  . History of adenomatous polyp of colon    2007, 2009 and 2013  tubular adenoma's  . History of alcohol abuse    quit 1963  . History of cerebral parenchymal hemorrhage    01/ 2006  left occiptial lobe related to hypertensive crisis  . History of CVA (cerebrovascular accident)    09-12-2012  left hippocampus/ amygdala junction and per MRI old white matter infarcts--  per pt residual short- term memory issues  . History of fatty infiltration of liver hx visit's at [facility] , last visit 05/ 2014   elvated LFT's ,  via liver bx 2004 related to hx alcohol and drug abuse (quit 1964)  . 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 HTN (hypertension)   . Renal cyst, left   . Stroke ([place])    hx  of 3 strokes in past   . 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 Urethral lesion    urethral mass    Patient Active Problem List   Diagnosis Date Noted  . SVT (supraventricular tachycardia) ([place]) 06/03/2017  . Dizziness 08/02/2014  . 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 CVA (cerebral infarction) 09/13/2012  . Vertigo 09/12/2012  . EAR PAIN 09/05/2009  . BACK PAIN 06/20/2009  . COLONIC POLYPS 05/06/2007  . 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 NONDEPENDENT ALCOHOL ABUSE IN REMISSION 05/06/2007  . HYPERTENSION, SEVERE 05/06/2007  . Hypertensive heart disease without heart failure 05/06/2007  . INTRACRANIAL HEMORRHAGE 05/06/2007  . Cerebrovascular disease  05/06/2007  . HEMORRHOIDS 05/06/2007  . INGUINAL HERNIA, RIGHT 05/06/2007  . RENAL CYST 05/06/2007  . HEMATURIA UNSPECIFIED 05/06/2007  . 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 PSA, INCREASED 05/06/2007  . LIVER FUNCTION TESTS, ABNORMAL 05/06/2007  . BPH (benign prostatic hypertrophy) with urinary obstruction 05/06/2007  . PERS HX NONCOMPLIANCE W/MED TX PRS HAZARDS HLTH 05/06/2007    Past Surgical History:  Procedure Laterality Date  . CARDIOVASCULAR STRESS TEST  05/05/2007   normal nuclear study w/ no ischemia/  normal LV fucntion and wall motion , ef60%  . COLONOSCOPY  last one 04-06-2012  . CYSTO/  LEFT RETROGRADE PYELOGRAM/ CYTOLOGY WASHINGS/  URETEROSCOPY  03/05/2000  . INGUINAL HERNIA REPAIR Bilateral 1965 and 1980's  . LAPAROSCOPIC INGUINAL HERNIA WITH UMBILICAL HERNIA Right 65/78/4696  . LIVER BIOPSY  1980's and 2004  . SVT ABLATION N/A 11/15/2018   Procedure: SVT ABLATION;  Surgeon: Evans Lance, MD;  Location: [facility];  Service: Cardiovascular;  Laterality: N/A;  . TRANSTHORACIC ECHOCARDIOGRAM  09/13/2012   moderate LVH,  ef 60-65%/    . TRANSURETHRAL RESECTION OF BLADDER TUMOR N/A 08/11/2016   Procedure: TRANSURETHRAL RESECTION OF BLADDER TUMOR (TURBT);  Surgeon: Cleon Gustin, MD;  Location[facility];  Service: Urology;  Laterality: N/A;       Family History  Problem Relation Age of Onset  . Rheum arthritis Mother   . Diabetes Mother   . Stroke Mother   . Heart attack Mother   . Kidney failure Mother   . Heart attack Father   . Heart disease Maternal Grandmother   . Rheum arthritis Maternal Grandmother   . Diabetes Maternal Grandmother   . Stroke Maternal Grandmother   . Colon cancer Neg Hx     Social History   Tobacco Use  . Smoking status: Former Smoker    Packs/day: 1.00    Years: 5.00    Pack years: 5.00    Types: Cigarettes    Quit date: 11/30/1981    Years since quitting: 38.0  . Smokeless tobacco: Never  Used  Vaping Use  . Vaping Use: Never used  Substance Use Topics  . Alcohol use: No    Alcohol/week: 0.0 standard drinks    Comment: hx abuse -- quit:  1963  . Drug use: No    Comment: hx abuse -- quit 1964 (iv heroin and cocaine    Home Medications Prior to Admission medications   Medication Sig Start Date End Date Taking? Authorizing Provider  amLODipine (NORVASC) 5 MG tablet Take 5 mg by mouth daily.    [provider]  aspirin EC 81 MG tablet Take 81 mg by mouth daily.    [provider]  azithromycin (ZITHROMAX) 250 MG tablet Take 1 tablet (250 mg total) by mouth daily.  Take one tablet daily 09/17/19   Quintella Reichert, MD  brimonidine [facility]) 0.2 % ophthalmic solution Place 1 drop into both eyes 2 (two) times daily.    [provider]  carvedilol (COREG) 25 MG tablet Take 25 mg by mouth 2 (two) times a day.     [provider]  cephALEXin (KEFLEX) 500 MG capsule Take 1 capsule (500 mg total) by mouth 2 (two) times daily. 09/17/19   Quintella Reichert, MD  cetirizine (ZYRTEC) 5 MG chewable tablet Chew 1 tablet (5 mg total) by mouth daily. 06/04/18   Wurst, [place], PA-C  clopidogrel (PLAVIX) 75 MG tablet Take 1 tablet (75 mg total) by mouth daily with breakfast. 05/06/13   Noralee Space, MD  donepezil (ARICEPT) 5 MG tablet Take 5 mg by mouth at bedtime.    [provider]  finasteride (PROSCAR) 5 MG tablet Take 5 mg by mouth daily.     [provider]  latanoprost (XALATAN) 0.005 % ophthalmic solution Place 1 drop into both eyes at bedtime.     [provider]  memantine (NAMENDA) 5 MG tablet Take 5 mg by mouth 2 (two) times daily.    [provider]  tamsulosin (FLOMAX) 0.4 MG CAPS capsule Take 0.4 mg by mouth at bedtime.     [provider]    Allergies    Penicillins  Review of Systems   Review of Systems  Constitutional: Negative for chills and fever.  HENT: Negative for congestion.   Eyes:  Negative for pain.  Respiratory: Negative for cough and shortness of breath.   Cardiovascular: Positive for chest pain. Negative for leg swelling.  Gastrointestinal: Negative for abdominal pain and vomiting.  Genitourinary: Negative for dysuria.  Musculoskeletal: Positive for back pain. Negative for myalgias.  Skin: Negative for rash.  Neurological: Positive for light-headedness. Negative for dizziness and headaches.       Near syncope    Physical Exam Updated Vital Signs BP 134/90   Pulse 63   Temp 97.7 F (36.5 C) (Oral)   Resp 16   SpO2 96%   Physical Exam Vitals and nursing note reviewed.  Constitutional:      General: He is not in acute distress.    Comments: Pleasant 79 year old male in no acute distress sitting comfortably in bed.  Able answer questions appropriately and follows all commands.  HENT:     Head: Normocephalic and atraumatic.     Nose: Nose normal.     Mouth/Throat:     Mouth: Mucous membranes are moist.  Eyes:     General: No scleral icterus.    Extraocular Movements: Extraocular movements intact.     Pupils: Pupils are equal, round, and reactive to light.  Neck:     Comments: Full range of motion of neck with no midline tenderness. Cardiovascular:     Rate and Rhythm: Normal rate and regular rhythm.     Pulses: Normal pulses.     Heart sounds: Normal heart sounds.  Pulmonary:     Effort: Pulmonary effort is normal. No respiratory distress.     Breath sounds: Normal breath sounds. No wheezing.  Chest:     Chest wall: No tenderness.  Abdominal:     Palpations: Abdomen is soft.     Tenderness: There is no abdominal tenderness. There is no right CVA tenderness, left CVA tenderness, guarding or rebound.  Musculoskeletal:     Cervical back: Normal range of motion and neck supple. No tenderness.  Right lower leg: No edema.     Left lower leg: No edema.     Comments: No lower extremity swelling no calf tenderness.  Skin:    General: Skin is warm  and dry.     Capillary Refill: Capillary refill takes less than 2 seconds.  Neurological:     Mental Status: He is alert. Mental status is at baseline.     Comments: Alert and oriented to self, place, time and event.   Speech is fluent, clear without dysarthria or dysphasia.   Strength 5/5 in upper/lower extremities  Sensation intact in upper/lower extremities   Normal gait.  Normal finger-to-nose and feet tapping.  CN I not tested  CN II grossly intact visual fields bilaterally. Did not visualize posterior eye.   CN III, IV, VI PERRLA and EOMs intact bilaterally  CN V Intact sensation to sharp and light touch to the face  CN VII facial movements symmetric  CN VIII not tested  CN IX, X no uvula deviation, symmetric rise of soft palate  CN XI 5/5 SCM and trapezius strength bilaterally  CN XII Midline tongue protrusion, symmetric L/R movements   Psychiatric:        Mood and Affect: Mood normal.        Behavior: Behavior normal.     ED Results / Procedures / Treatments   Labs (all labs ordered are listed, but only abnormal results are displayed) Labs Reviewed  CBC - Abnormal; Notable for the following components:      Result Value   Platelets 120 (*)    All other components within normal limits  COMPREHENSIVE METABOLIC PANEL - Abnormal; Notable for the following components:   Glucose, Bld 137 (*)    Creatinine, Ser 1.26 (*)    GFR calc non Af Amer 54 (*)    All other components within normal limits  D-DIMER, QUANTITATIVE (NOT AT [facility]) - Abnormal; Notable for the following components:   D-Dimer, Quant 0.81 (*)    All other components within normal limits  CBG MONITORING, ED - Abnormal; Notable for the following components:   Glucose-Capillary 127 (*)    All other components within normal limits  TSH  T4, FREE  CBG MONITORING, ED  TROPONIN I (HIGH SENSITIVITY)  TROPONIN I (HIGH SENSITIVITY)    EKG EKG Interpretation  Date/Time:  Sunday December 04 2019 11:30:07  EDT Ventricular Rate:  61 PR Interval:    QRS Duration: 130 QT Interval:  428 QTC Calculation: 432 R Axis:   -80 Text Interpretation: Sinus rhythm Prolonged PR interval Nonspecific IVCD with LAD Anterior infarct, old Borderline T abnormalities, inferior leads similar to April 2021 Confirmed by Sherwood Gambler [phone]) on 12/04/2019 11:48:23 AM   Radiology DG Chest 2 View  Result Date: 12/04/2019 CLINICAL DATA:  Syncopal episode today. EXAM: CHEST - 2 VIEW COMPARISON:  05/29/2019 FINDINGS: Stable elevation of the right hemidiaphragm. Lungs are otherwise adequately inflated without consolidation, effusion or pneumothorax. Cardiomediastinal silhouette and remainder of the exam is unchanged. IMPRESSION: No active cardiopulmonary disease. Electronically Signed   By: Marin Olp M.D.   On: 12/04/2019 13:06   CT Angio Chest PE W/Cm &/Or Wo Cm  Result Date: 12/04/2019 CLINICAL DATA:  79 year old male with syncope, possible PE EXAM: CT ANGIOGRAPHY CHEST WITH CONTRAST TECHNIQUE: Multidetector CT imaging of the chest was performed using the standard protocol during bolus administration of intravenous contrast. Multiplanar CT image reconstructions and MIPs were obtained to evaluate the vascular anatomy. CONTRAST:  33mL OMNIPAQUE  IOHEXOL 350 MG/ML SOLN COMPARISON:  09/17/2019 FINDINGS: Cardiovascular: Heart: Heart size unchanged, with no cardiomegaly. No pericardial fluid/thickening. Minimal coronary artery calcifications of the left anterior descending coronary artery. Aorta: Aorta is not well opacified. Minimal calcifications of the aortic valve. Normal course caliber and contour. No aneurysm. No periaortic fluid. No significant calcified atherosclerosis. Pulmonary arteries: No central, lobar, segmental, or proximal subsegmental filling defects. Mediastinum/Nodes: Enlarged thyroid, similar to the prior. Unremarkable thoracic esophagus. No mediastinal adenopathy. Lungs/Pleura: Central airways are clear. No pleural  effusion. No confluent airspace disease. Scarring/atelectasis at the right lung bases.  No pneumothorax. Upper Abdomen: No acute. Musculoskeletal: No acute displaced fracture. Degenerative changes of the visualized lower cervical and thoracic spine. No bony canal narrowing. Review of the MIP images confirms the above findings. IMPRESSION: CT negative for pulmonary emboli.  No acute finding identified. Thyroid goiter. Additional ancillary findings as above. Electronically Signed   By: Corrie Mckusick D.O.   On: 12/04/2019 14:33    Procedures Procedures (including critical care time)  Medications Ordered in ED Medications  iohexol (OMNIPAQUE) 350 MG/ML injection 100 mL (65 mLs Intravenous Contrast Given 12/04/19 1420)  sodium chloride 0.9 % bolus 1,000 mL (1,000 mLs Intravenous New Bag/Given 12/04/19 1423)    ED Course  I have reviewed the triage vital signs and the nursing notes.  Pertinent labs & imaging results that were available during my care of the patient were reviewed by me and considered in my medical decision making (see chart for details).    MDM Rules/Calculators/A&P                          Patient is a 79 year old male with a past medical history detailed above presented today for near syncopal episode.  History elicited from himself and his wife.  It appears the patient did not fully syncopized but did seem somnolent and may have passed out in his chair for a brief period of time.  Physical exam is unremarkable and patient states he has no symptoms at this time however per EMS he was complaining of sternal chest pain and back pain.  Low suspicion for dissection.  Patient was seen here in April for similar symptoms and had dissection study done at that time which was negative.  Will obtain D-dimer to rule out pulmonary embolism although have low suspicion for this.  CBC is without leukocytosis or anemia.  CMP is without significant oxalate abnormality.  Plain x-ray 2 view shows no acute  abnormality.  CT scan shows no pulmonary embolism.  There was an abnormal appearing thyroid.  He has normal thyroid labs obtained here.  Doubt thyroid storm or myxedema coma.  Patient given a more regular normal saline blood pressures now appear to be within normal limits.  He was mildly hypotensive initially.  Suspect that polypharmacy is contributing to his somewhat frequent episodes of near syncope.  Patient's EKG is unchanged from prior.  He has a chronic bundle branch block.  No ST-T wave abnormalities.  Discussed EKG with attending physician.  I recommended discontinuing amlodipine for now and following up with his cardiologist and PCP.  He will continue taking carvedilol for blood pressure control.  His Flomax is also likely contributing to hypotension.  Patient and wife agreeable to plan at this time.  Will discharge with close follow-up.  He was given close return precautions.  I discussed this case with my attending physician who cosigned this note including patient's presenting symptoms,  physical exam, and planned diagnostics and interventions. Attending physician stated agreement with plan or made changes to plan which were implemented.   Attending physician assessed patient at bedside.  The medical records were personally reviewed by myself. I personally reviewed all lab results and interpreted all imaging studies and either concurred with their official read or contacted radiology for clarification. Additional history obtained from old records/EMS/family members.  This patient appears reasonably screened and I doubt any other medical condition requiring further workup, evaluation, or treatment in the ED at this time prior to discharge.   Patient's vitals are WNL apart from vital sign abnormalities discussed above, patient is in NAD, and able to ambulate in the ED at their baseline and able to tolerate PO.  Pain has been managed or a plan has been made for home management and has no  complaints prior to discharge. Patient is comfortable with above plan and for discharge at this time. All questions were answered prior to disposition. Results from the ER workup discussed with the patient face to face and all questions answered to the best of my ability. The patient is safe for discharge with strict return precautions. Patient appears safe for discharge with appropriate follow-up. Conveyed my impression with the patient and they voiced understanding and are agreeable to plan.   An After Visit Summary was printed and given to the patient.  Portions of this note were generated with [employer]. Dictation errors may occur despite best attempts at proofreading.   Final Clinical Impression(s) / ED Diagnoses Final diagnoses:  Near syncope  Chest pain, unspecified type  Acute left-sided thoracic back pain  Hypotension, unspecified hypotension type    Rx / DC Orders ED Discharge Orders    None       Tedd Sias, [place] 12/04/19 1819    Sherwood Gambler, MD 12/04/19 2218

## 2019-12-04 NOTE — ED Notes (Signed)
[name] BP 87/67, [place] PA informed, verbal order for 1L NS. Started by this RN

## 2019-12-04 NOTE — ED Triage Notes (Signed)
Pt here from home, had witnessed syncopal episode without injury at the table after finishing breakfast this morning. Fire Dept reports no radial pulses and difficulty getting a bp reading. On EMS arrival, pt alert and hypotensive, 90 systolic, reporting central chest pain. Given 324 ASA and 250 NS bolus PTA with relief. Pt A/O x 4, denies pain.

## 2019-12-04 NOTE — ED Notes (Signed)
Patient verbalizes understanding of discharge instructions. Opportunity for questioning and answers were provided. Armband removed by staff, pt discharged from ED by wheelchair with wife to transport home

## 2019-12-04 NOTE — Discharge Instructions (Addendum)
Please stop taking amlodipine until you are followed up by your cardiologist and primary care doctor.  Your blood pressures were low when he first arrived here and I suspect that this could be contributing to your near syncope/faint episodes.  Your CT scan was reassuring and showed that there were no blood clots in your lungs however there was a goiter that was found on your CT scan.  Your thyroid levels were normal here however this should be discussed with your primary care doctor for further surveillance.  If you have any new or concerning symptoms please return to the emergency department.  Please drink plenty of water.  Please gently exercise and eat 3 meals per day.  I have some concern that your amlodipine, carvedilol and Flomax together may have dropped your blood pressure.  This is why I am having you hold off on the amlodipine until you are followed up by your cardiologist and PCP.

## 2019-12-09 ENCOUNTER — Other Ambulatory Visit: Payer: Self-pay

## 2019-12-09 ENCOUNTER — Emergency Department (HOSPITAL_COMMUNITY)
Admission: EM | Admit: 2019-12-09 | Discharge: 2019-12-09 | Disposition: A | Discharge status: Home / Self Care | Payer: No Typology Code available for payment source | Attending: Emergency Medicine | Admitting: Emergency Medicine

## 2019-12-09 ENCOUNTER — Encounter (HOSPITAL_COMMUNITY): Payer: Self-pay | Admitting: Emergency Medicine

## 2019-12-09 DIAGNOSIS — E559 Vitamin D deficiency, unspecified: Secondary | ICD-10-CM | POA: Diagnosis not present

## 2019-12-09 DIAGNOSIS — Z8673 Personal history of transient ischemic attack (TIA), and cerebral infarction without residual deficits: Secondary | ICD-10-CM | POA: Insufficient documentation

## 2019-12-09 DIAGNOSIS — Z7982 Long term (current) use of aspirin: Secondary | ICD-10-CM | POA: Diagnosis not present

## 2019-12-09 DIAGNOSIS — Z87891 Personal history of nicotine dependence: Secondary | ICD-10-CM | POA: Insufficient documentation

## 2019-12-09 DIAGNOSIS — Z79899 Other long term (current) drug therapy: Secondary | ICD-10-CM | POA: Diagnosis not present

## 2019-12-09 DIAGNOSIS — R55 Syncope and collapse: Secondary | ICD-10-CM | POA: Insufficient documentation

## 2019-12-09 DIAGNOSIS — E213 Hyperparathyroidism, unspecified: Secondary | ICD-10-CM | POA: Diagnosis not present

## 2019-12-09 DIAGNOSIS — I10 Essential (primary) hypertension: Secondary | ICD-10-CM | POA: Insufficient documentation

## 2019-12-09 DIAGNOSIS — N183 Chronic kidney disease, stage 3 unspecified: Secondary | ICD-10-CM | POA: Diagnosis not present

## 2019-12-09 LAB — I-STAT CHEM 8, ED
BUN: 20 mg/dL (ref 8–23)
Calcium, Ion: 1.22 mmol/L (ref 1.15–1.40)
Chloride: 108 mmol/L (ref 98–111)
Creatinine, Ser: 0.9 mg/dL (ref 0.61–1.24)
Glucose, Bld: 110 mg/dL — ABNORMAL HIGH (ref 70–99)
HCT: 45 % (ref 39.0–52.0)
Hemoglobin: 15.3 g/dL (ref 13.0–17.0)
Potassium: 3.6 mmol/L (ref 3.5–5.1)
Sodium: 143 mmol/L (ref 135–145)
TCO2: 20 mmol/L — ABNORMAL LOW (ref 22–32)

## 2019-12-09 NOTE — ED Provider Notes (Signed)
[place] EMERGENCY DEPARTMENT Provider Note   CSN: [id number] Arrival date & time: 12/09/19  1052     History Chief Complaint  Patient presents with  . Near Syncope    Dustin Hamilton is a 79 y.o. male.  HPI   Has "dizzy spells" - this is the 3rd one this week - he had a sweaty feeling on the bike at PT - lightheaded getting off the bike - no fall - helped to ground - feels sweaty and "not present" - no CP / palpitations, no falls with them usually - seen here in last week and told to stop amlodipine - due to ? Hypotension and told to see PCP who he saw yesterday - they were in agreement.  Has been seen in the past by ER for same - no swelling o rn umbness.  Past Medical History:  Diagnosis Date  . BPH (benign prostatic hyperplasia)   . Cerebrovascular disease   . Congenital anomaly of diaphragm   . Elevated PSA   . Glaucoma, both eyes   . Hemorrhoid   . Hepatitis B surface antigen positive    02-20-2011  . History of adenomatous polyp of colon    2007, 2009 and 2013  tubular adenoma's  . History of alcohol abuse    quit 1963  . History of cerebral parenchymal hemorrhage    01/ 2006  left occiptial lobe related to hypertensive crisis  . History of CVA (cerebrovascular accident)    09-12-2012  left hippocampus/ amygdala junction and per MRI old white matter infarcts--  per pt residual short- term memory issues  . History of fatty infiltration of liver hx visit's at [facility] , last visit 05/ 2014   elvated LFT's ,  via liver bx 2004 related to hx alcohol and drug abuse (quit 1964)  . 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 HTN (hypertension)   . Renal cyst, left   . Stroke ([place])    hx of 3 strokes in past   . 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 Urethral lesion    urethral mass    Patient Active Problem List   Diagnosis Date Noted  . SVT (supraventricular tachycardia)  ([place]) 06/03/2017  . Dizziness 08/02/2014  . 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 CVA (cerebral infarction) 09/13/2012  . Vertigo 09/12/2012  . EAR PAIN 09/05/2009  . BACK PAIN 06/20/2009  . COLONIC POLYPS 05/06/2007  . 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 NONDEPENDENT ALCOHOL ABUSE IN REMISSION 05/06/2007  . HYPERTENSION, SEVERE 05/06/2007  . Hypertensive heart disease without heart failure 05/06/2007  . INTRACRANIAL HEMORRHAGE 05/06/2007  . Cerebrovascular disease 05/06/2007  . HEMORRHOIDS 05/06/2007  . INGUINAL HERNIA, RIGHT 05/06/2007  . RENAL CYST 05/06/2007  . HEMATURIA UNSPECIFIED 05/06/2007  . 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 PSA, INCREASED 05/06/2007  . LIVER FUNCTION TESTS, ABNORMAL 05/06/2007  . BPH (benign prostatic hypertrophy) with urinary obstruction 05/06/2007  . PERS HX NONCOMPLIANCE W/MED TX PRS HAZARDS HLTH 05/06/2007    Past Surgical History:  Procedure Laterality Date  . CARDIOVASCULAR STRESS TEST  05/05/2007   normal nuclear study w/ no ischemia/  normal LV fucntion and wall motion , ef60%  . COLONOSCOPY  last one 04-06-2012  . CYSTO/  LEFT RETROGRADE PYELOGRAM/ CYTOLOGY WASHINGS/  URETEROSCOPY  03/05/2000  . INGUINAL HERNIA REPAIR Bilateral 1965 and 1980's  . LAPAROSCOPIC INGUINAL  HERNIA WITH UMBILICAL HERNIA Right 82/42/3536  . LIVER BIOPSY  1980's and 2004  . SVT ABLATION N/A 11/15/2018   Procedure: SVT ABLATION;  Surgeon: Evans Lance, MD;  Location: [place] [facility];  Service: Cardiovascular;  Laterality: N/A;  . TRANSTHORACIC ECHOCARDIOGRAM  09/13/2012   moderate LVH,  ef 60-65%/    . TRANSURETHRAL RESECTION OF BLADDER TUMOR N/A 08/11/2016   Procedure: TRANSURETHRAL RESECTION OF BLADDER TUMOR (TURBT);  Surgeon: Cleon Gustin, MD;  Location: [facility];  Service: Urology;  Laterality: N/A;       Family History  Problem Relation Age of Onset  . Rheum arthritis Mother   . Diabetes Mother   . Stroke  Mother   . Heart attack Mother   . Kidney failure Mother   . Heart attack Father   . Heart disease Maternal Grandmother   . Rheum arthritis Maternal Grandmother   . Diabetes Maternal Grandmother   . Stroke Maternal Grandmother   . Colon cancer Neg Hx     Social History   Tobacco Use  . Smoking status: Former Smoker    Packs/day: 1.00    Years: 5.00    Pack years: 5.00    Types: Cigarettes    Quit date: 11/30/1981    Years since quitting: 38.0  . Smokeless tobacco: Never Used  Vaping Use  . Vaping Use: Never used  Substance Use Topics  . Alcohol use: No    Alcohol/week: 0.0 standard drinks    Comment: hx abuse -- quit:  1963  . Drug use: No    Comment: hx abuse -- quit 1964 (iv heroin and cocaine    Home Medications Prior to Admission medications   Medication Sig Start Date End Date Taking? Authorizing Provider  amLODipine (NORVASC) 5 MG tablet Take 5 mg by mouth daily.    [provider]  aspirin EC 81 MG tablet Take 81 mg by mouth daily.    [provider]  azithromycin (ZITHROMAX) 250 MG tablet Take 1 tablet (250 mg total) by mouth daily. Take one tablet daily 09/17/19   Quintella Reichert, MD  brimonidine [facility]) 0.2 % ophthalmic solution Place 1 drop into both eyes 2 (two) times daily.    [provider]  carvedilol (COREG) 25 MG tablet Take 25 mg by mouth 2 (two) times a day.     [provider]  cephALEXin (KEFLEX) 500 MG capsule Take 1 capsule (500 mg total) by mouth 2 (two) times daily. 09/17/19   Quintella Reichert, MD  cetirizine (ZYRTEC) 5 MG chewable tablet Chew 1 tablet (5 mg total) by mouth daily. 06/04/18   Wurst, [place], PA-C  clopidogrel (PLAVIX) 75 MG tablet Take 1 tablet (75 mg total) by mouth daily with breakfast. 05/06/13   Noralee Space, MD  donepezil (ARICEPT) 5 MG tablet Take 5 mg by mouth at bedtime.    [provider]  finasteride (PROSCAR) 5 MG tablet Take 5 mg by mouth daily.     [provider]   latanoprost (XALATAN) 0.005 % ophthalmic solution Place 1 drop into both eyes at bedtime.     [provider]  memantine (NAMENDA) 5 MG tablet Take 5 mg by mouth 2 (two) times daily.    [provider]  tamsulosin (FLOMAX) 0.4 MG CAPS capsule Take 0.4 mg by mouth at bedtime.     [provider]    Allergies    Penicillins  Review of Systems   Review of  Systems  All other systems reviewed and are negative.   Physical Exam Updated Vital Signs SpO2 97%   Physical Exam Vitals and nursing note reviewed.  Constitutional:      General: He is not in acute distress.    Appearance: He is well-developed.  HENT:     Head: Normocephalic and atraumatic.     Mouth/Throat:     Pharynx: No oropharyngeal exudate.  Eyes:     General: No scleral icterus.       Right eye: No discharge.        Left eye: No discharge.     Conjunctiva/sclera: Conjunctivae normal.     Pupils: Pupils are equal, round, and reactive to light.  Neck:     Thyroid: No thyromegaly.     Vascular: No JVD.  Cardiovascular:     Rate and Rhythm: Normal rate and regular rhythm.     Heart sounds: Normal heart sounds. No murmur heard.  No friction rub. No gallop.   Pulmonary:     Effort: Pulmonary effort is normal. No respiratory distress.     Breath sounds: Normal breath sounds. No wheezing or rales.  Abdominal:     General: Bowel sounds are normal. There is no distension.     Palpations: Abdomen is soft. There is no mass.     Tenderness: There is no abdominal tenderness.  Musculoskeletal:        General: No tenderness. Normal range of motion.     Cervical back: Normal range of motion and neck supple.  Lymphadenopathy:     Cervical: No cervical adenopathy.  Skin:    General: Skin is warm and dry.     Findings: No erythema or rash.  Neurological:     Mental Status: He is alert.     Coordination: Coordination normal.  Psychiatric:        Behavior: Behavior normal.     ED Results /  Procedures / Treatments   Labs (all labs ordered are listed, but only abnormal results are displayed) Labs Reviewed  I-STAT CHEM 8, ED - Abnormal; Notable for the following components:      Result Value   Glucose, Bld 110 (*)    TCO2 20 (*)    All other components within normal limits    EKG EKG Interpretation  Date/Time:  Friday December 09 2019 11:02:21 EDT Ventricular Rate:  61 PR Interval:    QRS Duration: 134 QT Interval:  401 QTC Calculation: 404 R Axis:   -66 Text Interpretation: Sinus rhythm Prolonged PR interval IVCD, consider atypical RBBB Anterior infarct, old Minimal ST elevation, lateral leads with 1st degree A-V block Confirmed by Noemi Chapel [phone]) on 12/09/2019 11:17:30 AM   Radiology No results found.  Procedures Procedures (including critical care time)  Medications Ordered in ED Medications - No data to display  ED Course  I have reviewed the triage vital signs and the nursing notes.  Pertinent labs & imaging results that were available during my care of the patient were reviewed by me and considered in my medical decision making (see chart for details).    MDM Rules/Calculators/A&P                          This patient is well-appearing, he does not want to be in the emergency department, he has had a very thorough work-up in the past multiple times, has no focal findings on exam of any concern, at this time  I feel comfortable letting the patient go home after checking an i-STAT chemistry to rule out electrolyte or anemic complications.  The EKG is very un concerning  The patient is feeling well, wants to go home, his chemistry shows no signs of electrolyte abnormalities renal dysfunction or anemia.  Stable for discharge at this time  Final Clinical Impression(s) / ED Diagnoses Final diagnoses:  Near syncope    Rx / DC Orders ED Discharge Orders    None       Noemi Chapel, MD 12/09/19 1251

## 2019-12-09 NOTE — ED Triage Notes (Signed)
Pt BIB [facility] from [place]. Pt was doing therapy session when he began to feel sweaty and dizzy. Staff at [employer] had pt continue and pt experienced near syncopal episode. Pt with history of vertigo. VSS. NAD.

## 2019-12-09 NOTE — Discharge Instructions (Signed)
Your testing today has not shown any specific abnormalities.  Please follow-up with your family doctor or return to the emergency department for severe or worsening symptoms.

## 2019-12-09 NOTE — ED Notes (Signed)
Pt discharge instructions reviewed with the patient. The patient verbalized understanding of instruction . Pt discharged.

## 2019-12-13 DIAGNOSIS — N183 Chronic kidney disease, stage 3 unspecified: Secondary | ICD-10-CM | POA: Diagnosis not present

## 2019-12-13 DIAGNOSIS — I10 Essential (primary) hypertension: Secondary | ICD-10-CM | POA: Diagnosis not present

## 2019-12-13 DIAGNOSIS — N390 Urinary tract infection, site not specified: Secondary | ICD-10-CM | POA: Diagnosis not present

## 2019-12-25 ENCOUNTER — Encounter (HOSPITAL_COMMUNITY): Payer: Self-pay

## 2019-12-25 ENCOUNTER — Other Ambulatory Visit: Payer: Self-pay

## 2019-12-25 ENCOUNTER — Emergency Department (HOSPITAL_COMMUNITY)
Admission: EM | Admit: 2019-12-25 | Discharge: 2019-12-25 | Disposition: A | Discharge status: Home / Self Care | Payer: No Typology Code available for payment source | Attending: Emergency Medicine | Admitting: Emergency Medicine

## 2019-12-25 DIAGNOSIS — I10 Essential (primary) hypertension: Secondary | ICD-10-CM | POA: Insufficient documentation

## 2019-12-25 DIAGNOSIS — Z7901 Long term (current) use of anticoagulants: Secondary | ICD-10-CM | POA: Insufficient documentation

## 2019-12-25 DIAGNOSIS — Z87891 Personal history of nicotine dependence: Secondary | ICD-10-CM | POA: Insufficient documentation

## 2019-12-25 DIAGNOSIS — R319 Hematuria, unspecified: Secondary | ICD-10-CM

## 2019-12-25 LAB — BASIC METABOLIC PANEL
Anion gap: 8 (ref 5–15)
BUN: 20 mg/dL (ref 8–23)
CO2: 24 mmol/L (ref 22–32)
Calcium: 9.5 mg/dL (ref 8.9–10.3)
Chloride: 108 mmol/L (ref 98–111)
Creatinine, Ser: 0.99 mg/dL (ref 0.61–1.24)
GFR calc Af Amer: >60 mL/min (ref >60)
GFR calc non Af Amer: >60 mL/min (ref >60)
Glucose, Bld: 103 mg/dL — ABNORMAL HIGH (ref 70–99)
Potassium: 3.9 mmol/L (ref 3.5–5.1)
Sodium: 140 mmol/L (ref 135–145)

## 2019-12-25 LAB — CBC
HCT: 47.7 % (ref 39.0–52.0)
Hemoglobin: 16 g/dL (ref 13.0–17.0)
MCH: 31.3 pg (ref 26.0–34.0)
MCHC: 33.5 g/dL (ref 30.0–36.0)
MCV: 93.2 fL (ref 80.0–100.0)
Platelets: 164 10*3/uL (ref 150–400)
RBC: 5.12 MIL/uL (ref 4.22–5.81)
RDW: 14.2 % (ref 11.5–15.5)
WBC: 4.7 10*3/uL (ref 4.0–10.5)
nRBC: 0 % (ref 0.0–0.2)

## 2019-12-25 LAB — URINALYSIS, ROUTINE W REFLEX MICROSCOPIC
Bilirubin Urine: NEGATIVE
Glucose, UA: NEGATIVE mg/dL
Ketones, ur: NEGATIVE mg/dL
Leukocytes,Ua: NEGATIVE
Nitrite: NEGATIVE
Protein, ur: 100 mg/dL — AB
RBC / HPF: >50 RBC/hpf — ABNORMAL HIGH (ref 0–5)
Specific Gravity, Urine: 1.016 (ref 1.005–1.030)
pH: 5 (ref 5.0–8.0)

## 2019-12-25 LAB — CBG MONITORING, ED: Glucose-Capillary: 99 mg/dL (ref 70–99)

## 2019-12-25 MED ORDER — CEPHALEXIN 500 MG PO CAPS: 500.0000 mg | ORAL_CAPSULE | Freq: Two times a day (BID) | ORAL | 0 refills | Status: AC

## 2019-12-25 NOTE — ED Notes (Signed)
Patient verbalizes understanding of discharge instructions. Opportunity for questioning and answers were provided. Armband removed by staff, pt discharged from ED via wheelchair to lobby to return home with spouse.   

## 2019-12-25 NOTE — ED Provider Notes (Signed)
[place] EMERGENCY DEPARTMENT Provider Note   CSN: [id number] Arrival date & time: 12/25/19  1317     History Chief Complaint  Patient presents with  . Hematuria    Dustin Hamilton is a 79 y.o. male with PMHx BPH, HTN, stroke currently on Plavix who presents to the ED today with complaint of gross hematuria that began yesterday.  Patient reports similar issues in the past, has been followed by a urologist and states that they suspected the hematuria was from a enlarged prostate.  They placed him on a max which seemed to help however when Dustin Hamilton noticed the blood again yesterday caused him concern.  Patient denies any other symptoms at this time including fevers, chills, abdominal pain, flank pain, nausea, vomiting, dysuria, urinary frequency, urgency, hesitancy, any other associated symptoms.  The history is provided by the patient, medical records and the spouse.       Past Medical History:  Diagnosis Date  . BPH (benign prostatic hyperplasia)   . Cerebrovascular disease   . Congenital anomaly of diaphragm   . Elevated PSA   . Glaucoma, both eyes   . Hemorrhoid   . Hepatitis B surface antigen positive    02-20-2011  . History of adenomatous polyp of colon    2007, 2009 and 2013  tubular adenoma's  . History of alcohol abuse    quit 1963  . History of cerebral parenchymal hemorrhage    01/ 2006  left occiptial lobe related to hypertensive crisis  . History of CVA (cerebrovascular accident)    09-12-2012  left hippocampus/ amygdala junction and per MRI old white matter infarcts--  per pt residual short- term memory issues  . History of fatty infiltration of liver hx visit's at [facility] , last visit 05/ 2014   elvated LFT's ,  via liver bx 2004 related to hx alcohol and drug abuse (quit 1964)  . 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 HTN (hypertension)   . Renal cyst, left   . Stroke ([place])    hx of 3 strokes  in past   . 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 Urethral lesion    urethral mass    Patient Active Problem List   Diagnosis Date Noted  . SVT (supraventricular tachycardia) ([place]) 06/03/2017  . Dizziness 08/02/2014  . 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 CVA (cerebral infarction) 09/13/2012  . Vertigo 09/12/2012  . EAR PAIN 09/05/2009  . BACK PAIN 06/20/2009  . COLONIC POLYPS 05/06/2007  . 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 NONDEPENDENT ALCOHOL ABUSE IN REMISSION 05/06/2007  . HYPERTENSION, SEVERE 05/06/2007  . Hypertensive heart disease without heart failure 05/06/2007  . INTRACRANIAL HEMORRHAGE 05/06/2007  . Cerebrovascular disease 05/06/2007  . HEMORRHOIDS 05/06/2007  . INGUINAL HERNIA, RIGHT 05/06/2007  . RENAL CYST 05/06/2007  . HEMATURIA UNSPECIFIED 05/06/2007  . 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 PSA, INCREASED 05/06/2007  . LIVER FUNCTION TESTS, ABNORMAL 05/06/2007  . BPH (benign prostatic hypertrophy) with urinary obstruction 05/06/2007  . PERS HX NONCOMPLIANCE W/MED TX PRS HAZARDS HLTH 05/06/2007    Past Surgical History:  Procedure Laterality Date  . CARDIOVASCULAR STRESS TEST  05/05/2007   normal nuclear study w/ no ischemia/  normal LV fucntion and wall motion , ef60%  . COLONOSCOPY  last one 04-06-2012  . CYSTO/  LEFT RETROGRADE PYELOGRAM/ CYTOLOGY WASHINGS/  URETEROSCOPY  03/05/2000  .  INGUINAL HERNIA REPAIR Bilateral 1965 and 1980's  . LAPAROSCOPIC INGUINAL HERNIA WITH UMBILICAL HERNIA Right 83/38/2505  . LIVER BIOPSY  1980's and 2004  . SVT ABLATION N/A 11/15/2018   Procedure: SVT ABLATION;  Surgeon: Evans Lance, MD;  Location: [place] [facility];  Service: Cardiovascular;  Laterality: N/A;  . TRANSTHORACIC ECHOCARDIOGRAM  09/13/2012   moderate LVH,  ef 60-65%/    . TRANSURETHRAL RESECTION OF BLADDER TUMOR N/A 08/11/2016   Procedure: TRANSURETHRAL RESECTION OF BLADDER TUMOR (TURBT);  Surgeon: Cleon Gustin, MD;  Location: [facility];  Service: Urology;  Laterality: N/A;       Family History  Problem Relation Age of Onset  . Rheum arthritis Mother   . Diabetes Mother   . Stroke Mother   . Heart attack Mother   . Kidney failure Mother   . Heart attack Father   . Heart disease Maternal Grandmother   . Rheum arthritis Maternal Grandmother   . Diabetes Maternal Grandmother   . Stroke Maternal Grandmother   . Colon cancer Neg Hx     Social History   Tobacco Use  . Smoking status: Former Smoker    Packs/day: 1.00    Years: 5.00    Pack years: 5.00    Types: Cigarettes    Quit date: 11/30/1981    Years since quitting: 38.0  . Smokeless tobacco: Never Used  Vaping Use  . Vaping Use: Never used  Substance Use Topics  . Alcohol use: No    Alcohol/week: 0.0 standard drinks    Comment: hx abuse -- quit:  1963  . Drug use: No    Comment: hx abuse -- quit 1964 (iv heroin and cocaine    Home Medications Prior to Admission medications   Medication Sig Start Date End Date Taking? Authorizing Provider  amLODipine (NORVASC) 5 MG tablet Take 5 mg by mouth daily.    [provider]  aspirin EC 81 MG tablet Take 81 mg by mouth daily.    [provider]  azithromycin (ZITHROMAX) 250 MG tablet Take 1 tablet (250 mg total) by mouth daily. Take one tablet daily 09/17/19   Quintella Reichert, MD  brimonidine [facility]) 0.2 % ophthalmic solution Place 1 drop into both eyes 2 (two) times daily.    [provider]  carvedilol (COREG) 25 MG tablet Take 25 mg by mouth 2 (two) times a day.     [provider]  cephALEXin (KEFLEX) 500 MG capsule Take 1 capsule (500 mg total) by mouth 2 (two) times daily for 5 days. 12/25/19 12/30/19  Eustaquio Maize, PA-C  cetirizine (ZYRTEC) 5 MG chewable tablet Chew 1 tablet (5 mg total) by mouth daily. 06/04/18   Wurst, [place], PA-C  clopidogrel (PLAVIX) 75 MG tablet Take 1 tablet (75 mg total) by mouth daily  with breakfast. 05/06/13   Noralee Space, MD  donepezil (ARICEPT) 5 MG tablet Take 5 mg by mouth at bedtime.    [provider]  finasteride (PROSCAR) 5 MG tablet Take 5 mg by mouth daily.     [provider]  latanoprost (XALATAN) 0.005 % ophthalmic solution Place 1 drop into both eyes at bedtime.     [provider]  memantine (NAMENDA) 5 MG tablet Take 5 mg by mouth 2 (two) times daily.    [provider]  tamsulosin (FLOMAX) 0.4 MG CAPS capsule Take 0.4 mg by mouth at bedtime.     [provider]  Allergies    Penicillins  Review of Systems   Review of Systems  Constitutional: Negative for chills and fever.  Gastrointestinal: Negative for abdominal pain, nausea and vomiting.  Genitourinary: Positive for hematuria. Negative for difficulty urinating, dysuria and flank pain.  All other systems reviewed and are negative.   Physical Exam Updated Vital Signs BP (!) 197/98 (BP Location: Right Arm)   Pulse 52   Temp 98.3 F (36.8 C) (Oral)   Resp 18   Ht 5\' 11"  (1.803 m)   Wt 76.2 kg   SpO2 98%   BMI 23.43 kg/m   Physical Exam Vitals and nursing note reviewed.  Constitutional:      Appearance: Dustin Hamilton is not ill-appearing or diaphoretic.  HENT:     Head: Normocephalic and atraumatic.  Eyes:     Conjunctiva/sclera: Conjunctivae normal.  Cardiovascular:     Rate and Rhythm: Normal rate and regular rhythm.  Pulmonary:     Effort: Pulmonary effort is normal.     Breath sounds: Normal breath sounds. No wheezing, rhonchi or rales.  Abdominal:     Palpations: Abdomen is soft.     Tenderness: There is no abdominal tenderness. There is no right CVA tenderness, left CVA tenderness, guarding or rebound.  Musculoskeletal:     Cervical back: Neck supple.  Skin:    General: Skin is warm and dry.  Neurological:     Mental Status: Dustin Hamilton is alert.     ED Results / Procedures / Treatments   Labs (all labs ordered are listed, but only  abnormal results are displayed) Labs Reviewed  URINALYSIS, ROUTINE W REFLEX MICROSCOPIC - Abnormal; Notable for the following components:      Result Value   Color, Urine AMBER (*)    APPearance CLOUDY (*)    Hgb urine dipstick MODERATE (*)    Protein, ur 100 (*)    RBC / HPF >50 (*)    Bacteria, UA RARE (*)    All other components within normal limits  BASIC METABOLIC PANEL - Abnormal; Notable for the following components:   Glucose, Bld 103 (*)    All other components within normal limits  URINE CULTURE  CBC  CBG MONITORING, ED    EKG None  Radiology No results found.  Procedures Procedures (including critical care time)  Medications Ordered in ED Medications - No data to display  ED Course  I have reviewed the triage vital signs and the nursing notes.  Pertinent labs & imaging results that were available during my care of the patient were reviewed by me and considered in my medical decision making (see chart for details).    MDM Rules/Calculators/A&P                          79 year old male who presents to the ED today with complaint of gross hematuria that began yesterday.  History of same, sees urologist.  Dustin Hamilton has no other complaints at this time.  On arrival to the ED patient is afebrile, nontachycardic and nontachypneic.  Dustin Hamilton appears to be in no acute distress.  Dustin Hamilton has no focal abdominal tenderness on exam or flank tenderness today.  Urine sample at bedside, grossly bloody.  Will test at this time however Dustin Hamilton denies any symptoms consistent with a UTI today.  Dustin Hamilton does have a history of BPH and takes Flomax.  Does not feel like Dustin Hamilton is holding onto urine at this time.  UA was obtained while  patient was in the waiting room, greater than 50 red blood cells per high-power field.  6-10 white blood cells appreciated with bacteria.  No nitrites or leuks however.  Sent for culture.  CBC without leukocytosis.  Hemoglobin stable.  BMP without electrolyte abnormalities.  Will obtain  post void residual to ensure patient has not having any urinary retention and if it is normal Dustin Hamilton can be discharged home with close urology follow-up.   Postvoid residual 106.  Patient to be discharged home at this time.  Given Dustin Hamilton has some rare bacteria with 6-10 white blood cells will cover for infection at this time.  Patient does have an allergy to penicillins however has been able to tolerate cephalosporins in the past.  Will discharge with Keflex at this time.   This note was prepared using Dragon voice recognition software and may include unintentional dictation errors due to the inherent limitations of voice recognition software.  Final Clinical Impression(s) / ED Diagnoses Final diagnoses:  Hematuria, unspecified type    Rx / DC Orders ED Discharge Orders         Ordered    cephALEXin (KEFLEX) 500 MG capsule  2 times daily     Discontinue  Reprint     12/25/19 2039           Discharge Instructions     Please call Dr. Alyson Ingles tomorrow to schedule an appointment for further evaluation regarding the blood in your urine I have prescribed an antibiotic to help cover for an infection given there was a small amount of bacteria in your urine. Take to any pharmacy to have filled and take as prescribed.  Return to the ED IMMEDIATELY for any worsening symptoms including feeling like you are not voiding all the way, abdominal pain/distension, nausea, vomiting, fevers > 100.4, or any other new/concerning symptoms.        Eustaquio Maize, PA-C 12/25/19 2041    Lennice Sites, DO 12/25/19 2220

## 2019-12-25 NOTE — Discharge Instructions (Signed)
Please call Dr. Alyson Ingles tomorrow to schedule an appointment for further evaluation regarding the blood in your urine I have prescribed an antibiotic to help cover for an infection given there was a small amount of bacteria in your urine. Take to any pharmacy to have filled and take as prescribed.  Return to the ED IMMEDIATELY for any worsening symptoms including feeling like you are not voiding all the way, abdominal pain/distension, nausea, vomiting, fevers > 100.4, or any other new/concerning symptoms.

## 2019-12-25 NOTE — ED Triage Notes (Signed)
Pt arrives to ED w/ c/o hematuria onset this morning. Pt denies back/flank pain. Pt denies injury/trauma.

## 2019-12-28 ENCOUNTER — Emergency Department (HOSPITAL_COMMUNITY)
Admission: EM | Admit: 2019-12-28 | Discharge: 2019-12-28 | Disposition: A | Discharge status: Home / Self Care | Payer: No Typology Code available for payment source | Attending: Emergency Medicine | Admitting: Emergency Medicine

## 2019-12-28 ENCOUNTER — Other Ambulatory Visit: Payer: Self-pay

## 2019-12-28 ENCOUNTER — Encounter (HOSPITAL_COMMUNITY): Payer: Self-pay

## 2019-12-28 DIAGNOSIS — Z7982 Long term (current) use of aspirin: Secondary | ICD-10-CM | POA: Diagnosis not present

## 2019-12-28 DIAGNOSIS — R55 Syncope and collapse: Secondary | ICD-10-CM | POA: Diagnosis present

## 2019-12-28 DIAGNOSIS — Z87891 Personal history of nicotine dependence: Secondary | ICD-10-CM | POA: Diagnosis not present

## 2019-12-28 DIAGNOSIS — I10 Essential (primary) hypertension: Secondary | ICD-10-CM | POA: Diagnosis not present

## 2019-12-28 DIAGNOSIS — Z79899 Other long term (current) drug therapy: Secondary | ICD-10-CM | POA: Insufficient documentation

## 2019-12-28 DIAGNOSIS — R310 Gross hematuria: Secondary | ICD-10-CM | POA: Diagnosis not present

## 2019-12-28 DIAGNOSIS — R42 Dizziness and giddiness: Secondary | ICD-10-CM | POA: Diagnosis not present

## 2019-12-28 LAB — BASIC METABOLIC PANEL
Anion gap: 8 (ref 5–15)
BUN: 22 mg/dL (ref 8–23)
CO2: 29 mmol/L (ref 22–32)
Calcium: 9.8 mg/dL (ref 8.9–10.3)
Chloride: 105 mmol/L (ref 98–111)
Creatinine, Ser: 0.97 mg/dL (ref 0.61–1.24)
GFR calc Af Amer: >60 mL/min (ref >60)
GFR calc non Af Amer: >60 mL/min (ref >60)
Glucose, Bld: 90 mg/dL (ref 70–99)
Potassium: 4.4 mmol/L (ref 3.5–5.1)
Sodium: 142 mmol/L (ref 135–145)

## 2019-12-28 LAB — URINALYSIS, ROUTINE W REFLEX MICROSCOPIC
Bilirubin Urine: NEGATIVE
Glucose, UA: NEGATIVE mg/dL
Ketones, ur: NEGATIVE mg/dL
Nitrite: NEGATIVE
Protein, ur: 30 mg/dL — AB
Specific Gravity, Urine: 1.019 (ref 1.005–1.030)
pH: 5 (ref 5.0–8.0)

## 2019-12-28 LAB — CBC
HCT: 48.7 % (ref 39.0–52.0)
Hemoglobin: 16.2 g/dL (ref 13.0–17.0)
MCH: 31.8 pg (ref 26.0–34.0)
MCHC: 33.3 g/dL (ref 30.0–36.0)
MCV: 95.5 fL (ref 80.0–100.0)
Platelets: 159 10*3/uL (ref 150–400)
RBC: 5.1 MIL/uL (ref 4.22–5.81)
RDW: 14.6 % (ref 11.5–15.5)
WBC: 5.1 10*3/uL (ref 4.0–10.5)
nRBC: 0 % (ref 0.0–0.2)

## 2019-12-28 LAB — CBG MONITORING, ED: Glucose-Capillary: 103 mg/dL — ABNORMAL HIGH (ref 70–99)

## 2019-12-28 MED ORDER — SODIUM CHLORIDE 0.9% FLUSH: 3.0000 mL | Freq: Once | INTRAVENOUS | Status: DC

## 2019-12-28 NOTE — ED Triage Notes (Signed)
Patient reports he was at [facility] urology for prostate check up and had near syncope episode and sent to ED.    Hx. Hypertension    A/ox4

## 2019-12-28 NOTE — ED Notes (Signed)
Provided pt w/labeled specimen cup for urine collection. ENMiles 

## 2019-12-28 NOTE — ED Notes (Signed)
Patient provided warm blanket. Patient repositioned in bed. Patient family at bedside. No other needs expressed at this time.

## 2019-12-28 NOTE — ED Provider Notes (Signed)
[facility] DEPT Provider Note   CSN: [id number] Arrival date & time: 12/28/19  1121     History Chief Complaint  Patient presents with  . Near Syncope    Dustin Hamilton is a 79 y.o. male.  HPI Patient presents with near syncope.  Has had few episodes this month already.  Was at urology follow-up for his prostate.  States that he began to feel more lightheaded.  Did not feel his heart going fast or slow.  States he just began to feel worse.  States initially had some trouble speaking to.  States he felt as if he was going to pass out.  States he normally gets these episodes and lays down and feels better in about 15 minutes.  No headaches.  No chest pain.  Get seen primarily at the [place].  Has seen Dr. Alvester Chou from cardiology and has had a previous ablation for SVT also.    Past Medical History:  Diagnosis Date  . BPH (benign prostatic hyperplasia)   . Cerebrovascular disease   . Congenital anomaly of diaphragm   . Elevated PSA   . Glaucoma, both eyes   . Hemorrhoid   . Hepatitis B surface antigen positive    02-20-2011  . History of adenomatous polyp of colon    2007, 2009 and 2013  tubular adenoma's  . History of alcohol abuse    quit 1963  . History of cerebral parenchymal hemorrhage    01/ 2006  left occiptial lobe related to hypertensive crisis  . History of CVA (cerebrovascular accident)    09-12-2012  left hippocampus/ amygdala junction and per MRI old white matter infarcts--  per pt residual short- term memory issues  . History of fatty infiltration of liver hx visit's at [facility] , last visit 05/ 2014   elvated LFT's ,  via liver bx 2004 related to hx alcohol and drug abuse (quit 1964)  . 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 HTN (hypertension)   . Renal cyst, left   . Stroke ([place])    hx of 3 strokes in past   . 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 Urethral  lesion    urethral mass    Patient Active Problem List   Diagnosis Date Noted  . SVT (supraventricular tachycardia) ([place]) 06/03/2017  . Dizziness 08/02/2014  . 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 CVA (cerebral infarction) 09/13/2012  . Vertigo 09/12/2012  . EAR PAIN 09/05/2009  . BACK PAIN 06/20/2009  . COLONIC POLYPS 05/06/2007  . 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 NONDEPENDENT ALCOHOL ABUSE IN REMISSION 05/06/2007  . HYPERTENSION, SEVERE 05/06/2007  . Hypertensive heart disease without heart failure 05/06/2007  . INTRACRANIAL HEMORRHAGE 05/06/2007  . Cerebrovascular disease 05/06/2007  . HEMORRHOIDS 05/06/2007  . INGUINAL HERNIA, RIGHT 05/06/2007  . RENAL CYST 05/06/2007  . HEMATURIA UNSPECIFIED 05/06/2007  . 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 PSA, INCREASED 05/06/2007  . LIVER FUNCTION TESTS, ABNORMAL 05/06/2007  . BPH (benign prostatic hypertrophy) with urinary obstruction 05/06/2007  . PERS HX NONCOMPLIANCE W/MED TX PRS HAZARDS HLTH 05/06/2007    Past Surgical History:  Procedure Laterality Date  . CARDIOVASCULAR STRESS TEST  05/05/2007   normal nuclear study w/ no ischemia/  normal LV fucntion and wall motion , ef60%  . COLONOSCOPY  last one 04-06-2012  . CYSTO/  LEFT RETROGRADE PYELOGRAM/ CYTOLOGY WASHINGS/  URETEROSCOPY  03/05/2000  . INGUINAL HERNIA REPAIR Bilateral 1965 and 1980's  . LAPAROSCOPIC INGUINAL HERNIA WITH UMBILICAL HERNIA Right 75/03/2584  . LIVER BIOPSY  1980's and 2004  . SVT ABLATION N/A 11/15/2018   Procedure: SVT ABLATION;  Surgeon: Evans Lance, MD;  Location: [facility];  Service: Cardiovascular;  Laterality: N/A;  . TRANSTHORACIC ECHOCARDIOGRAM  09/13/2012   moderate LVH,  ef 60-65%/    . TRANSURETHRAL RESECTION OF BLADDER TUMOR N/A 08/11/2016   Procedure: TRANSURETHRAL RESECTION OF BLADDER TUMOR (TURBT);  Surgeon: Cleon Gustin, MD;  Location[facility];  Service: Urology;  Laterality:  N/A;       Family History  Problem Relation Age of Onset  . Rheum arthritis Mother   . Diabetes Mother   . Stroke Mother   . Heart attack Mother   . Kidney failure Mother   . Heart attack Father   . Heart disease Maternal Grandmother   . Rheum arthritis Maternal Grandmother   . Diabetes Maternal Grandmother   . Stroke Maternal Grandmother   . Colon cancer Neg Hx     Social History   Tobacco Use  . Smoking status: Former Smoker    Packs/day: 1.00    Years: 5.00    Pack years: 5.00    Types: Cigarettes    Quit date: 11/30/1981    Years since quitting: 38.1  . Smokeless tobacco: Never Used  Vaping Use  . Vaping Use: Never used  Substance Use Topics  . Alcohol use: No    Alcohol/week: 0.0 standard drinks    Comment: hx abuse -- quit:  1963  . Drug use: No    Comment: hx abuse -- quit 1964 (iv heroin and cocaine    Home Medications Prior to Admission medications   Medication Sig Start Date End Date Taking? Authorizing Provider  amLODipine (NORVASC) 5 MG tablet Take 5 mg by mouth daily.    [provider]  aspirin EC 81 MG tablet Take 81 mg by mouth daily.    [provider]  azithromycin (ZITHROMAX) 250 MG tablet Take 1 tablet (250 mg total) by mouth daily. Take one tablet daily 09/17/19   Quintella Reichert, MD  brimonidine [facility]) 0.2 % ophthalmic solution Place 1 drop into both eyes 2 (two) times daily.    [provider]  carvedilol (COREG) 25 MG tablet Take 25 mg by mouth 2 (two) times a day.     [provider]  cephALEXin (KEFLEX) 500 MG capsule Take 1 capsule (500 mg total) by mouth 2 (two) times daily for 5 days. 12/25/19 12/30/19  Eustaquio Maize, PA-C  cetirizine (ZYRTEC) 5 MG chewable tablet Chew 1 tablet (5 mg total) by mouth daily. 06/04/18   Wurst, [place], PA-C  clopidogrel (PLAVIX) 75 MG tablet Take 1 tablet (75 mg total) by mouth daily with breakfast. 05/06/13   Noralee Space, MD  donepezil (ARICEPT) 5 MG tablet Take 5 mg  by mouth at bedtime.    [provider]  finasteride (PROSCAR) 5 MG tablet Take 5 mg by mouth daily.     [provider]  latanoprost (XALATAN) 0.005 % ophthalmic solution Place 1 drop into both eyes at bedtime.     [provider]  memantine (NAMENDA) 5 MG tablet Take 5 mg by mouth 2 (two) times daily.    [provider]  tamsulosin (FLOMAX) 0.4 MG CAPS capsule Take 0.4 mg by mouth at bedtime.  [provider]    Allergies    Penicillins  Review of Systems   Review of Systems  Constitutional: Negative for appetite change.  HENT: Negative for congestion.   Eyes: Positive for visual disturbance.  Respiratory: Negative for shortness of breath.   Cardiovascular: Negative for chest pain.  Gastrointestinal: Negative for abdominal pain.  Genitourinary: Negative for flank pain.  Musculoskeletal: Negative for back pain.  Neurological: Positive for speech difficulty and light-headedness.  Psychiatric/Behavioral: Negative for confusion.    Physical Exam Updated Vital Signs BP (!) 183/106   Pulse 53   Temp 98.7 F (37.1 C) (Oral)   Resp 18   Ht 5\' 11"  (1.803 m)   Wt 76.2 kg   SpO2 98%   BMI 23.43 kg/m   Physical Exam Vitals and nursing note reviewed.  HENT:     Head: Atraumatic.  Eyes:     Extraocular Movements: Extraocular movements intact.     Pupils: Pupils are equal, round, and reactive to light.  Cardiovascular:     Rate and Rhythm: Normal rate and regular rhythm.  Pulmonary:     Breath sounds: No wheezing or rhonchi.  Abdominal:     Tenderness: There is no abdominal tenderness.  Musculoskeletal:        General: No tenderness.  Skin:    General: Skin is warm.     Capillary Refill: Capillary refill takes less than 2 seconds.  Neurological:     Mental Status: He is alert and oriented to person, place, and time.     ED Results / Procedures / Treatments   Labs (all labs ordered are listed, but only abnormal results  are displayed) Labs Reviewed  URINALYSIS, ROUTINE W REFLEX MICROSCOPIC - Abnormal; Notable for the following components:      Result Value   APPearance HAZY (*)    Hgb urine dipstick MODERATE (*)    Protein, ur 30 (*)    Leukocytes,Ua MODERATE (*)    Bacteria, UA RARE (*)    All other components within normal limits  CBG MONITORING, ED - Abnormal; Notable for the following components:   Glucose-Capillary 103 (*)    All other components within normal limits  BASIC METABOLIC PANEL  CBC    EKG EKG Interpretation  Date/Time:  Wednesday December 28 2019 11:46:22 EDT Ventricular Rate:  62 PR Interval:  322 QRS Duration: 120 QT Interval:  408 QTC Calculation: 414 R Axis:   -70 Text Interpretation: Sinus rhythm with 1st degree A-V block Left axis deviation Anteroseptal infarct , age undetermined Abnormal ECG No significant change since last tracing Confirmed by Davonna Belling [phone]) on 12/28/2019 2:38:51 PM   Radiology No results found.  Procedures Procedures (including critical care time)  Medications Ordered in ED Medications  sodium chloride flush (NS) 0.9 % injection 3 mL (has no administration in time range)    ED Course  I have reviewed the triage vital signs and the nursing notes.  Pertinent labs & imaging results that were available during my care of the patient were reviewed by me and considered in my medical decision making (see chart for details).    MDM Rules/Calculators/A&P                          Patient with syncope/near syncope.  History of same.  Had been worked up for the same.  PCP and nephrology have been adjusting his blood pressure medicines.  Now appears to be running hypertensive.  Benign exam.  No arrhythmia while here.  Will be able to follow-up with his cardiologist and has appointment with nephrology tomorrow.  Will discharge home.  [facility]  Has had CTA and head CT done with previous episodes of the same. Final Clinical  Impression(s) / ED Diagnoses Final diagnoses:  Near syncope  Hypertension, unspecified type    Rx / DC Orders ED Discharge Orders    None       Davonna Belling, MD 12/28/19 1450

## 2019-12-28 NOTE — Discharge Instructions (Signed)
Follow-up with cardiology for further monitoring.  Also follow-up with your primary care doctor

## 2019-12-29 DIAGNOSIS — I10 Essential (primary) hypertension: Secondary | ICD-10-CM | POA: Diagnosis not present

## 2019-12-29 DIAGNOSIS — N390 Urinary tract infection, site not specified: Secondary | ICD-10-CM | POA: Diagnosis not present

## 2019-12-29 DIAGNOSIS — N183 Chronic kidney disease, stage 3 unspecified: Secondary | ICD-10-CM | POA: Diagnosis not present

## 2020-01-09 DIAGNOSIS — H2513 Age-related nuclear cataract, bilateral: Secondary | ICD-10-CM | POA: Diagnosis not present

## 2020-01-09 DIAGNOSIS — H25043 Posterior subcapsular polar age-related cataract, bilateral: Secondary | ICD-10-CM | POA: Diagnosis not present

## 2020-01-09 DIAGNOSIS — H401132 Primary open-angle glaucoma, bilateral, moderate stage: Secondary | ICD-10-CM | POA: Diagnosis not present

## 2020-01-09 DIAGNOSIS — H25013 Cortical age-related cataract, bilateral: Secondary | ICD-10-CM | POA: Diagnosis not present

## 2020-01-22 ENCOUNTER — Ambulatory Visit
Admission: EM | Admit: 2020-01-22 | Discharge: 2020-01-22 | Disposition: A | Discharge status: Home / Self Care | Payer: Medicare PPO

## 2020-01-22 ENCOUNTER — Other Ambulatory Visit: Payer: Self-pay

## 2020-01-22 DIAGNOSIS — R079 Chest pain, unspecified: Secondary | ICD-10-CM

## 2020-01-22 DIAGNOSIS — R0609 Other forms of dyspnea: Secondary | ICD-10-CM

## 2020-01-22 NOTE — ED Triage Notes (Signed)
Patient presents for chest pain on exertion and dizziness that started this morning when he woke up.  He denies shortness of breath, and nausea/vomiting.

## 2020-01-22 NOTE — Discharge Instructions (Signed)
80 year old male with history of CVA, HTN, comes in for acute onset of central chest pain on exertion that started this morning.  States had associated shortness of breath, fatigue without nausea or vomiting.  Has baseline dizziness that is currently being evaluated, this has not changed since symptoms started.  Denies syncope.  States for the past few months, has felt more fatigued or short of breath during exertion.  Denies fever, URI symptoms.  Chest pain has since resolved.  BP 143/85 HR 65 RR 18 Temp 97.6 O2 sat 94% Constitutional: afebrile, nontoxic Heart: RRR Lungs: LCTAB Neuro: alert and oriented x 4  EKG sinus rhythm with first-degree AV block, 66bpm, LAD, nonspecific T wave changes compared to last EKG. No ST elevation.  Patient currently without chest pain.  However, has had increased exertional fatigue, dyspnea on exertion, worries for cardiac causes of symptoms.  Will discharge in stable condition to the ED for further evaluation.

## 2020-01-22 NOTE — ED Provider Notes (Signed)
79 year old male with history of CVA, HTN, comes in for acute onset of central chest pain on exertion that started this morning.  States had associated shortness of breath, fatigue without nausea or vomiting.  Has baseline dizziness that is currently being evaluated, this has not changed since symptoms started.  Denies syncope.  States for the past few months, has felt more fatigued or short of breath during exertion.  Denies fever, URI symptoms.  Chest pain has since resolved.  BP 143/85 HR 65 RR 18 Temp 97.6 O2 sat 94% Constitutional: afebrile, nontoxic Heart: RRR Lungs: LCTAB Neuro: alert and oriented x 4  EKG sinus rhythm with first-degree AV block, 66bpm, LAD, nonspecific T wave changes compared to last EKG. No ST elevation.  Patient currently without chest pain.  However, has had increased exertional fatigue, dyspnea on exertion, worries for cardiac causes of symptoms.  Will discharge in stable condition to the ED for further evaluation.    Ok Edwards, PA-C 01/22/20 1204

## 2020-01-23 DIAGNOSIS — H2512 Age-related nuclear cataract, left eye: Secondary | ICD-10-CM | POA: Diagnosis not present

## 2020-01-23 DIAGNOSIS — H2513 Age-related nuclear cataract, bilateral: Secondary | ICD-10-CM | POA: Diagnosis not present

## 2020-01-23 DIAGNOSIS — H401132 Primary open-angle glaucoma, bilateral, moderate stage: Secondary | ICD-10-CM | POA: Diagnosis not present

## 2020-01-26 ENCOUNTER — Telehealth: Payer: Self-pay | Admitting: *Deleted

## 2020-01-26 DIAGNOSIS — I10 Essential (primary) hypertension: Secondary | ICD-10-CM | POA: Diagnosis not present

## 2020-01-26 DIAGNOSIS — N183 Chronic kidney disease, stage 3 unspecified: Secondary | ICD-10-CM | POA: Diagnosis not present

## 2020-01-26 DIAGNOSIS — N390 Urinary tract infection, site not specified: Secondary | ICD-10-CM | POA: Diagnosis not present

## 2020-01-26 NOTE — Telephone Encounter (Signed)
Appt schedule with Dr Gwenlyn Found 09/27 @9 :Dustin Hamilton

## 2020-01-26 NOTE — Telephone Encounter (Signed)
   Primary Cardiologist: Quay Burow, MD  Chart reviewed as part of pre-operative protocol coverage.   While cataract extractions are recognized in guidelines as low risk surgeries that do not typically require specific preoperative testing or holding of blood thinner therapy; chart review shows recent ER and UC visits for CP and syncope.  He has an appt tomorrow with Dr. Gwenlyn Found. I would like Dr. Gwenlyn Found to discuss his procedure at that time - OK to proceed with surgery vs ischemic evaluation prior to cataract surgery.  I will route this recommendation to the requesting party via Epic fax function and remove from pre-op pool.  Please call with questions.  Tami Lin Amisha Pospisil, PA 01/26/2020, 9:48 AM

## 2020-01-26 NOTE — Telephone Encounter (Signed)
   [facility] Pre-operative Risk Assessment    HEARTCARE STAFF: - Please ensure there is not already an duplicate clearance open for this procedure. - Under Visit Info/Reason for Call, type in Other and utilize the format Clearance MM/DD/YY or Clearance TBD. Do not use dashes or single digits. - If request is for dental extraction, please clarify the # of teeth to be extracted.  Request for surgical clearance:  1. What type of surgery is being performed? Cataract extraction w/intraocular lens implantation of the left eye followed by the right eye   2. When is this surgery scheduled? OS 03-01-20 and OD 03-15-20   3. What type of clearance is required (medical clearance vs. Pharmacy clearance to hold med vs. Both)? medical  4. Are there any medications that need to be held prior to surgery and how long?none   5. Practice name and name of physician performing surgery? [facility]   6. What is the office phone number? [phone]   7.   What is the office fax number? [phone]  8.   Anesthesia type (None, local, MAC, general) ? Topical w/IV medication   Fredia Beets 01/26/2020, 7:26 AM  _________________________________________________________________   (provider comments below)

## 2020-01-27 ENCOUNTER — Ambulatory Visit: Payer: Medicare PPO | Admitting: Cardiovascular Disease

## 2020-01-27 ENCOUNTER — Encounter: Payer: Self-pay | Admitting: Cardiovascular Disease

## 2020-01-27 ENCOUNTER — Other Ambulatory Visit: Payer: Self-pay

## 2020-01-27 ENCOUNTER — Telehealth: Payer: Self-pay | Admitting: Radiology

## 2020-01-27 VITALS — BP 132/92 | HR 69 | Ht 71.0 in | Wt 166.6 lb

## 2020-01-27 DIAGNOSIS — I471 Supraventricular tachycardia, unspecified: Secondary | ICD-10-CM

## 2020-01-27 DIAGNOSIS — R0789 Other chest pain: Secondary | ICD-10-CM

## 2020-01-27 DIAGNOSIS — R42 Dizziness and giddiness: Secondary | ICD-10-CM | POA: Diagnosis not present

## 2020-01-27 NOTE — Assessment & Plan Note (Signed)
History of recurrent dizziness for unclear reasons.  He did have a "dizzy episode" while in the examining room today.  I checked his pulse at the time and it was in the 60 or 70 range suggesting that was not arrhythmogenic Lee mediated.  I am going to get a 2-week Zio patch to further evaluate.  He does not drive.

## 2020-01-27 NOTE — Assessment & Plan Note (Signed)
history of atypical chest pain With a negative Myoview stress test performed 06/12/2017.  He recently was seen in the ER for atypical chest pain as well with a negative work-up.  That was his only episode since I saw him last according to him and his wife.

## 2020-01-27 NOTE — Telephone Encounter (Signed)
Pt have appt with Dr Gwenlyn Found today

## 2020-01-27 NOTE — Progress Notes (Signed)
01/27/2020 Dustin Hamilton   [birth year]  [id number]  Primary Physician Clinic, [facility] Primary Cardiologist: Lorretta Harp MD Lupe Carney, [place]  HPI:  Dustin Hamilton is a 79 y.o. thin-appearing married African-American male father of one, grandfather of 2 grandchildren who was referred by the emergency room for evaluation of her recent episode of PSVT.  I last saw him in the office 06/03/2017.  He is accompanied by his wife Dustin Hamilton today.  His primary care provider is Dr. Dell Ponto at the [facility]. He does have a history of treated hypertension. He's never had a heart attack or stroke. He has been his recent substernal chest pain over the last several months. He presented to the [facility] emergency room on 05/30/17 with PSVT and hypotension. This broke with typical Valsalva maneuver. He has had no recurrent episodes.  He underwent AVNRT ablation by Dr. Lovena Le 11/15/2018 without recurrence.  He did have a negative Myoview after his last office visit with me on 06/12/2017.  He was just recently seen in the emergency room for complaints of chest pain 01/22/2020 with negative work-up.  He has had recurrent dizzy episodes and actually had an episode which I witnessed in the exam room today.  I checked his pulse at the time which was in the 60 range suggesting that it was not arrhythmogenicly  mediated.   Current Meds  Medication Sig  . amLODipine (NORVASC) 5 MG tablet Take 5 mg by mouth daily.  Marland Kitchen aspirin EC 81 MG tablet Take 81 mg by mouth daily.  Marland Kitchen azithromycin (ZITHROMAX) 250 MG tablet Take 1 tablet (250 mg total) by mouth daily. Take one tablet daily  . brimonidine (ALPHAGAN) 0.2 % ophthalmic solution Place 1 drop into both eyes 2 (two) times daily.  . cetirizine (ZYRTEC) 5 MG chewable tablet Chew 1 tablet (5 mg total) by mouth daily.  . clopidogrel (PLAVIX) 75 MG tablet Take 1 tablet (75 mg total) by mouth daily with breakfast.  .  donepezil (ARICEPT) 5 MG tablet Take 5 mg by mouth at bedtime.  . dutasteride (AVODART) 0.5 MG capsule Take 0.5 mg by mouth daily.  . finasteride (PROSCAR) 5 MG tablet Take 5 mg by mouth daily.   Marland Kitchen latanoprost (XALATAN) 0.005 % ophthalmic solution Place 1 drop into both eyes at bedtime.   . memantine (NAMENDA) 5 MG tablet Take 5 mg by mouth 2 (two) times daily.  . metoprolol tartrate (LOPRESSOR) 50 MG tablet Take 50 mg by mouth 2 (two) times daily.  . tamsulosin (FLOMAX) 0.4 MG CAPS capsule Take 0.4 mg by mouth at bedtime.   . [DISCONTINUED] carvedilol (COREG) 25 MG tablet Take 25 mg by mouth 2 (two) times a day.      Allergies  Allergen Reactions  . Penicillins Hives    Has patient had a PCN reaction causing immediate rash, facial/tongue/throat swelling, SOB or lightheadedness with hypotension: Yes Has patient had a PCN reaction causing severe rash involving mucus membranes or skin necrosis: Yes Has patient had a PCN reaction that required hospitalization No Has patient had a PCN reaction occurring within the last 10 years: No If all of the above answers are "NO", then may proceed with Cephalosporin use.     Social History   Socioeconomic History  . Marital status: Married    Spouse name: Dustin Hamilton  . Number of children: 1  . Years of education: College  . Highest education level: Not on file  Occupational History  . Occupation: Geophysicist/field seismologist  Tobacco Use  . Smoking status: Former Smoker    Packs/day: 1.00    Years: 5.00    Pack years: 5.00    Types: Cigarettes    Quit date: 11/30/1981    Years since quitting: 38.1  . Smokeless tobacco: Never Used  Vaping Use  . Vaping Use: Never used  Substance and Sexual Activity  . Alcohol use: No    Alcohol/week: 0.0 standard drinks    Comment: hx abuse -- quit:  1963  . 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 Sexual activity: Not on file  Other Topics Concern  . Not on file  Social History Narrative    Patient lives at home with his spouse.   Caffeine Use:  Tea, lots   Social Determinants of Health   Financial Resource Strain:   . Difficulty of Paying Living Expenses: Not on file  Food Insecurity:   . Worried About Charity fundraiser in the Last Year: Not on file  . Ran Out of Food in the Last Year: Not on file  Transportation Needs:   . Lack of Transportation (Medical): Not on file  . Lack of Transportation (Non-Medical): Not on file  Physical Activity:   . Days of Exercise per Week: Not on file  . Minutes of Exercise per Session: Not on file  Stress:   . Feeling of Stress : Not on file  Social Connections:   . Frequency of Communication with Friends and Family: Not on file  . Frequency of Social Gatherings with Friends and Family: Not on file  . Attends Religious Services: Not on file  . Active Member of Clubs or Organizations: Not on file  . Attends Archivist Meetings: Not on file  . Marital Status: Not on file  Intimate Partner Violence:   . Fear of Current or Ex-Partner: Not on file  . Emotionally Abused: Not on file  . Physically Abused: Not on file  . Sexually Abused: Not on file     Review of Systems: General: negative for chills, fever, night sweats or weight changes.  Cardiovascular: negative for chest pain, dyspnea on exertion, edema, orthopnea, palpitations, paroxysmal nocturnal dyspnea or shortness of breath Dermatological: negative for rash Respiratory: negative for cough or wheezing Urologic: negative for hematuria Abdominal: negative for nausea, vomiting, diarrhea, bright red blood per rectum, melena, or hematemesis Neurologic: negative for visual changes, syncope, or dizziness All other systems reviewed and are otherwise negative except as noted above.    Blood pressure (!) 132/92, pulse 69, height 5\' 11"  (1.803 m), weight 166 lb 9.6 oz (75.6 kg), SpO2 98 %.  General appearance: alert and no distress Neck: no adenopathy, no carotid  bruit, no JVD, supple, symmetrical, trachea midline and thyroid not enlarged, symmetric, no tenderness/mass/nodules Lungs: clear to auscultation bilaterally Heart: regular rate and rhythm, S1, S2 normal, no murmur, click, rub or gallop Extremities: extremities normal, atraumatic, no cyanosis or edema Pulses: 2+ and symmetric Skin: Skin color, texture, turgor normal. No rashes or lesions Neurologic: Alert and oriented X 3, normal strength and tone. Normal symmetric reflexes. Normal coordination and gait  EKG not performed today  ASSESSMENT AND PLAN:   HYPERTENSION, SEVERE History of essential hypertension blood pressure measured today 132/92.  He is on amlodipine and metoprolol.  SVT (supraventricular tachycardia) (HCC) History of PSVT status post AVNRT ablation by Dr. Lovena Le 11/15/2018.  He has  had no recurrence since that time.  Dizziness History of recurrent dizziness for unclear reasons.  He did have a "dizzy episode" while in the examining room today.  I checked his pulse at the time and it was in the 60 or 70 range suggesting that was not arrhythmogenic Lee mediated.  I am going to get a 2-week Zio patch to further evaluate.  He does not drive.  Atypical chest pain history of atypical chest pain With a negative Myoview stress test performed 06/12/2017.  He recently was seen in the ER for atypical chest pain as well with a negative work-up.  That was his only episode since I saw him last according to him and his wife.      Lorretta Harp MD FACP,FACC,FAHA, [facility] 01/27/2020 9:30 AM

## 2020-01-27 NOTE — Assessment & Plan Note (Signed)
History of essential hypertension blood pressure measured today 132/92.  He is on amlodipine and metoprolol.

## 2020-01-27 NOTE — Patient Instructions (Signed)
Medication Instructions:  Your physician recommends that you continue on your current medications as directed. Please refer to the Current Medication list given to you today.  *If you need a refill on your cardiac medications before your next appointment, please call your pharmacy*  Testing/Procedures:  [place] Monitor Instructions   Your physician has requested you wear your ZIO patch monitor 14 days.   This is a single patch monitor.  [employer] supplies one patch monitor per enrollment.  Additional stickers are not available.   Please do not apply patch if you will be having a Nuclear Stress Test, Echocardiogram, Cardiac CT, MRI, or Chest Xray during the time frame you would be wearing the monitor. The patch cannot be worn during these tests.  You cannot remove and re-apply the ZIO XT patch monitor.   Your ZIO patch monitor will be sent USPS Priority mail from [facility] directly to your home address. The monitor may also be mailed to a PO BOX if home delivery is not available.   It may take 3-5 days to receive your monitor after you have been enrolled.   Once you have received you monitor, please review enclosed instructions.  Your monitor has already been registered assigning a specific monitor serial # to you.   Applying the monitor   Shave hair from upper left chest.   Hold abrader disc by orange tab.  Rub abrader in 40 strokes over left upper chest as indicated in your monitor instructions.   Clean area with 4 enclosed alcohol pads .  Use all pads to assure are is cleaned thoroughly.  Let dry.   Apply patch as indicated in monitor instructions.  Patch will be place under collarbone on left side of chest with arrow pointing upward.   Rub patch adhesive wings for 2 minutes.Remove white label marked "1".  Remove white label marked "2".  Rub patch adhesive wings for 2 additional minutes.   While looking in a mirror, press and release button in center of patch.  A  small green light will flash 3-4 times .  This will be your only indicator the monitor has been turned on.     Do not shower for the first 24 hours.  You may shower after the first 24 hours.   Press button if you feel a symptom. You will hear a small click.  Record Date, Time and Symptom in the Patient Log Book.   When you are ready to remove patch, follow instructions on last 2 pages of Patient Log Book.  Stick patch monitor onto last page of Patient Log Book.   Place Patient Log Book in [place] box.  Use locking tab on box and tape box closed securely.  The [employer] has [employer] on it.  Please place in mailbox as soon as possible.  Your physician should have your test results approximately 7 days after the monitor has been mailed back to [facility].   Call [place] at [phone] if you have questions regarding your ZIO XT patch monitor.  Call them immediately if you see an orange light blinking on your monitor.   If your monitor falls off in less than 4 days contact our Monitor department at [phone].  If your monitor becomes loose or falls off after 4 days call [employer] at [phone] for suggestions on securing your monitor.     Follow-Up: At [facility], you and your health needs are our priority.  As  part of our continuing mission to provide you with exceptional heart care, we have created designated Provider Care Teams.  These Care Teams include your primary Cardiologist (physician) and Advanced Practice Providers (APPs -  Physician Assistants and Nurse Practitioners) who all work together to provide you with the care you need, when you need it.  We recommend signing up for the patient portal called "MyChart".  Sign up information is provided on this After Visit Summary.  MyChart is used to connect with patients for Virtual Visits (Telemedicine).  Patients are able to view lab/test results, encounter notes, upcoming appointments, etc.   Non-urgent messages can be sent to your provider as well.   To learn more about what you can do with MyChart, go to [email or web address].    Your next appointment:   3 month(s)  The format for your next appointment:   In Person  Provider:    Kerin Ransom, PA-C  Sande Rives, PA-C  Coletta Memos, FNP  6 months with Dr. Gwenlyn Found

## 2020-01-27 NOTE — Telephone Encounter (Signed)
Enrolled patient for a 14 day Zio XT  monitor to be mailed to patients home  °

## 2020-01-27 NOTE — Assessment & Plan Note (Signed)
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 Lovena Le 11/15/2018.  He has had no recurrence since that time.

## 2020-02-01 ENCOUNTER — Other Ambulatory Visit (INDEPENDENT_AMBULATORY_CARE_PROVIDER_SITE_OTHER): Payer: Medicare PPO

## 2020-02-01 DIAGNOSIS — I471 Supraventricular tachycardia: Secondary | ICD-10-CM | POA: Diagnosis not present

## 2020-02-27 DIAGNOSIS — I471 Supraventricular tachycardia: Secondary | ICD-10-CM | POA: Diagnosis not present

## 2020-02-28 ENCOUNTER — Emergency Department (HOSPITAL_COMMUNITY): Payer: No Typology Code available for payment source

## 2020-02-28 ENCOUNTER — Emergency Department (HOSPITAL_COMMUNITY)
Admission: EM | Admit: 2020-02-28 | Discharge: 2020-02-28 | Disposition: A | Discharge status: Home / Self Care | Payer: No Typology Code available for payment source | Attending: Emergency Medicine | Admitting: Emergency Medicine

## 2020-02-28 ENCOUNTER — Encounter (HOSPITAL_COMMUNITY): Payer: Self-pay | Admitting: Emergency Medicine

## 2020-02-28 DIAGNOSIS — I10 Essential (primary) hypertension: Secondary | ICD-10-CM | POA: Insufficient documentation

## 2020-02-28 DIAGNOSIS — Z79899 Other long term (current) drug therapy: Secondary | ICD-10-CM | POA: Insufficient documentation

## 2020-02-28 DIAGNOSIS — Z7982 Long term (current) use of aspirin: Secondary | ICD-10-CM | POA: Diagnosis not present

## 2020-02-28 DIAGNOSIS — R001 Bradycardia, unspecified: Secondary | ICD-10-CM | POA: Diagnosis not present

## 2020-02-28 DIAGNOSIS — R4020 Unspecified coma: Secondary | ICD-10-CM | POA: Diagnosis not present

## 2020-02-28 DIAGNOSIS — Z87891 Personal history of nicotine dependence: Secondary | ICD-10-CM | POA: Insufficient documentation

## 2020-02-28 DIAGNOSIS — R61 Generalized hyperhidrosis: Secondary | ICD-10-CM | POA: Diagnosis not present

## 2020-02-28 DIAGNOSIS — R55 Syncope and collapse: Secondary | ICD-10-CM

## 2020-02-28 DIAGNOSIS — R404 Transient alteration of awareness: Secondary | ICD-10-CM

## 2020-02-28 LAB — CBC
HCT: 48.5 % (ref 39.0–52.0)
Hemoglobin: 16.1 g/dL (ref 13.0–17.0)
MCH: 31.6 pg (ref 26.0–34.0)
MCHC: 33.2 g/dL (ref 30.0–36.0)
MCV: 95.3 fL (ref 80.0–100.0)
Platelets: 130 10*3/uL — ABNORMAL LOW (ref 150–400)
RBC: 5.09 MIL/uL (ref 4.22–5.81)
RDW: 13.9 % (ref 11.5–15.5)
WBC: 5.3 10*3/uL (ref 4.0–10.5)
nRBC: 0 % (ref 0.0–0.2)

## 2020-02-28 LAB — URINALYSIS, ROUTINE W REFLEX MICROSCOPIC
Bacteria, UA: NONE SEEN
Bilirubin Urine: NEGATIVE
Glucose, UA: NEGATIVE mg/dL
Ketones, ur: NEGATIVE mg/dL
Nitrite: NEGATIVE
Protein, ur: 100 mg/dL — AB
Specific Gravity, Urine: 1.018 (ref 1.005–1.030)
WBC, UA: >50 WBC/hpf — ABNORMAL HIGH (ref 0–5)
pH: 5 (ref 5.0–8.0)

## 2020-02-28 LAB — BASIC METABOLIC PANEL
Anion gap: 7 (ref 5–15)
BUN: 21 mg/dL (ref 8–23)
CO2: 23 mmol/L (ref 22–32)
Calcium: 9.4 mg/dL (ref 8.9–10.3)
Chloride: 111 mmol/L (ref 98–111)
Creatinine, Ser: 1.31 mg/dL — ABNORMAL HIGH (ref 0.61–1.24)
GFR calc Af Amer: 60 mL/min — ABNORMAL LOW (ref >60)
GFR calc non Af Amer: 51 mL/min — ABNORMAL LOW (ref >60)
Glucose, Bld: 106 mg/dL — ABNORMAL HIGH (ref 70–99)
Potassium: 4.2 mmol/L (ref 3.5–5.1)
Sodium: 141 mmol/L (ref 135–145)

## 2020-02-28 LAB — CBG MONITORING, ED: Glucose-Capillary: 101 mg/dL — ABNORMAL HIGH (ref 70–99)

## 2020-02-28 LAB — TROPONIN I (HIGH SENSITIVITY): Troponin I (High Sensitivity): 13 ng/L (ref <18)

## 2020-02-28 NOTE — ED Triage Notes (Signed)
Pt found in chair unresponsive by wife, ems states pt cool to the touch upon their arrival, received 250 cc NS, pt woke up and asked to have a BM, pt ambulated to restroom and had another syncopal episode on the way out of the restroom but did not injure himself. 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 18g LFA. 140/86, CBG 136.

## 2020-02-28 NOTE — ED Provider Notes (Signed)
  Face-to-face evaluation   History: He presents for evaluation of an episode of decreased responsiveness, felt to be syncope. He is currently under evaluation by cardiology for periods of dizziness. He takes metoprolol, has history of PSVT.  About a month ago, his nephrologist discontinued his carvedilol, and placed him on the metoprolol and amlodipine to control blood pressure.  His wife states that the episode he had pain was similar to prior multiple episodes with the exception of he was having trouble talking, and it lasted a little bit longer than usual.  Today was 15 minutes, and is usually only 5 minutes.  Physical exam: Alert, lucid and cooperative.  No dysarthria or aphasia.  Normal strength arms and legs bilaterally.  Medical decision making-current symptoms of dizziness, with bradycardia and low blood pressure.  Wife concerned because patient could not talk for period of time while he had the episode tonight.  He is currently being managed by cardiology with outpatient cardiac monitoring, results not yet available.  Patient recently started on a different beta-blocker, along with amlodipine.  His heart rate is now less than 50, previously was above 50.  He appears mildly dehydrated today based on renal function testing.  Unclear if he has any UTI, is not specifically symptomatic for it.  Urine culture sent in case he becomes symptomatic with fever or elevated white blood cell count.  Follow-up recommended with nephrology regarding renal function, and blood pressure medication, as well as cardiology regarding the recent cardiac monitoring.  Medical screening examination/treatment/procedure(s) were conducted as a shared visit with non-physician practitioner(s) and myself.  I personally evaluated the patient during the encounter    Daleen Bo, MD 02/28/20 2236

## 2020-02-28 NOTE — ED Provider Notes (Cosign Needed)
[facility] EMERGENCY DEPARTMENT Provider Note   CSN: [id number] Arrival date & time: 02/28/20  1148     History Chief Complaint  Patient presents with  . Loss of Consciousness    Dustin Hamilton is a 79 y.o. male.  Pt reports he passed out at home.  Pt reports he was sitting.  He did not fall.  Pt reports this has happened before   The history is provided by the patient. No language interpreter was used.  Loss of Consciousness Episode history:  Single Most recent episode:  Today Timing:  Constant Progression:  Worsening Chronicity:  New Relieved by:  Nothing Worsened by:  Nothing Ineffective treatments:  None tried      Past Medical History:  Diagnosis Date  . BPH (benign prostatic hyperplasia)   . Cerebrovascular disease   . Congenital anomaly of diaphragm   . Elevated PSA   . Glaucoma, both eyes   . Hemorrhoid   . Hepatitis B surface antigen positive    02-20-2011  . History of adenomatous polyp of colon    2007, 2009 and 2013  tubular adenoma's  . History of alcohol abuse    quit 1963  . History of cerebral parenchymal hemorrhage    01/ 2006  left occiptial lobe related to hypertensive crisis  . History of CVA (cerebrovascular accident)    09-12-2012  left hippocampus/ amygdala junction and per MRI old white matter infarcts--  per pt residual short- term memory issues  . History of fatty infiltration of liver hx visit's at [facility] , last visit 05/ 2014   elvated LFT's ,  via liver bx 2004 related to hx alcohol and drug abuse (quit 1964)  . 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 HTN (hypertension)   . Renal cyst, left   . Stroke ([place])    hx of 3 strokes in past   . 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 Urethral lesion    urethral mass    Patient Active Problem List   Diagnosis Date Noted  . Atypical chest pain 01/27/2020  . SVT (supraventricular  tachycardia) ([place]) 06/03/2017  . Dizziness 08/02/2014  . 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 CVA (cerebral infarction) 09/13/2012  . Vertigo 09/12/2012  . EAR PAIN 09/05/2009  . BACK PAIN 06/20/2009  . COLONIC POLYPS 05/06/2007  . 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 NONDEPENDENT ALCOHOL ABUSE IN REMISSION 05/06/2007  . HYPERTENSION, SEVERE 05/06/2007  . Hypertensive heart disease without heart failure 05/06/2007  . INTRACRANIAL HEMORRHAGE 05/06/2007  . Cerebrovascular disease 05/06/2007  . HEMORRHOIDS 05/06/2007  . INGUINAL HERNIA, RIGHT 05/06/2007  . RENAL CYST 05/06/2007  . HEMATURIA UNSPECIFIED 05/06/2007  . 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 PSA, INCREASED 05/06/2007  . LIVER FUNCTION TESTS, ABNORMAL 05/06/2007  . BPH (benign prostatic hypertrophy) with urinary obstruction 05/06/2007  . PERS HX NONCOMPLIANCE W/MED TX PRS HAZARDS HLTH 05/06/2007    Past Surgical History:  Procedure Laterality Date  . CARDIOVASCULAR STRESS TEST  05/05/2007   normal nuclear study w/ no ischemia/  normal LV fucntion and wall motion , ef60%  . COLONOSCOPY  last one 04-06-2012  . CYSTO/  LEFT RETROGRADE PYELOGRAM/ CYTOLOGY WASHINGS/  URETEROSCOPY  03/05/2000  . INGUINAL HERNIA REPAIR Bilateral 1965 and 1980's  . LAPAROSCOPIC INGUINAL HERNIA WITH UMBILICAL HERNIA Right 48/54/6270  . LIVER BIOPSY  1980's and 2004  .  SVT ABLATION N/A 11/15/2018   Procedure: SVT ABLATION;  Surgeon: Evans Lance, MD;  Location: [facility];  Service: Cardiovascular;  Laterality: N/A;  . TRANSTHORACIC ECHOCARDIOGRAM  09/13/2012   moderate LVH,  ef 60-65%/    . TRANSURETHRAL RESECTION OF BLADDER TUMOR N/A 08/11/2016   Procedure: TRANSURETHRAL RESECTION OF BLADDER TUMOR (TURBT);  Surgeon: Cleon Gustin, MD;  Location: [facility];  Service: Urology;  Laterality: N/A;       Family History  Problem Relation Age of Onset  . Rheum arthritis Mother   . Diabetes  Mother   . Stroke Mother   . Heart attack Mother   . Kidney failure Mother   . Heart attack Father   . Heart disease Maternal Grandmother   . Rheum arthritis Maternal Grandmother   . Diabetes Maternal Grandmother   . Stroke Maternal Grandmother   . Colon cancer Neg Hx     Social History   Tobacco Use  . Smoking status: Former Smoker    Packs/day: 1.00    Years: 5.00    Pack years: 5.00    Types: Cigarettes    Quit date: 11/30/1981    Years since quitting: 38.2  . Smokeless tobacco: Never Used  Vaping Use  . Vaping Use: Never used  Substance Use Topics  . Alcohol use: No    Alcohol/week: 0.0 standard drinks    Comment: hx abuse -- quit:  1963  . Drug use: No    Comment: hx abuse -- quit 1964 (iv heroin and cocaine    Home Medications Prior to Admission medications   Medication Sig Start Date End Date Taking? Authorizing Provider  amLODipine (NORVASC) 5 MG tablet Take 5 mg by mouth daily.    [provider]  aspirin EC 81 MG tablet Take 81 mg by mouth daily.    [provider]  azithromycin (ZITHROMAX) 250 MG tablet Take 1 tablet (250 mg total) by mouth daily. Take one tablet daily 09/17/19   Quintella Reichert, MD  brimonidine [facility]) 0.2 % ophthalmic solution Place 1 drop into both eyes 2 (two) times daily.    [provider]  cetirizine (ZYRTEC) 5 MG chewable tablet Chew 1 tablet (5 mg total) by mouth daily. 06/04/18   [place], [place], PA-C  clopidogrel (PLAVIX) 75 MG tablet Take 1 tablet (75 mg total) by mouth daily with breakfast. 05/06/13   Noralee Space, MD  donepezil (ARICEPT) 5 MG tablet Take 5 mg by mouth at bedtime.    [provider]  dutasteride (AVODART) 0.5 MG capsule Take 0.5 mg by mouth daily.    [provider]  finasteride (PROSCAR) 5 MG tablet Take 5 mg by mouth daily.     [provider]  latanoprost (XALATAN) 0.005 % ophthalmic solution Place 1 drop into both eyes at bedtime.     [provider]   memantine (NAMENDA) 5 MG tablet Take 5 mg by mouth 2 (two) times daily.    [provider]  metoprolol tartrate (LOPRESSOR) 50 MG tablet Take 50 mg by mouth 2 (two) times daily.    [provider]  tamsulosin (FLOMAX) 0.4 MG CAPS capsule Take 0.4 mg by mouth at bedtime.     [provider]    Allergies    Penicillins  Review of Systems   Review of Systems  Cardiovascular: Positive for syncope.  All other systems reviewed and are negative.   Physical Exam Updated Vital Signs BP Marland Kitchen)  178/92 (BP Location: Left Arm)   Pulse (!) 48   Temp 98.3 F (36.8 C) (Oral)   Resp 16   SpO2 96%   Physical Exam Vitals and nursing note reviewed.  Constitutional:      Appearance: He is well-developed.  HENT:     Head: Normocephalic and atraumatic.     Nose: Nose normal.  Eyes:     Conjunctiva/sclera: Conjunctivae normal.  Cardiovascular:     Rate and Rhythm: Normal rate and regular rhythm.     Heart sounds: No murmur heard.   Pulmonary:     Effort: Pulmonary effort is normal. No respiratory distress.     Breath sounds: Normal breath sounds.  Abdominal:     Palpations: Abdomen is soft.     Tenderness: There is no abdominal tenderness.  Musculoskeletal:        General: Normal range of motion.     Cervical back: Neck supple.  Skin:    General: Skin is warm and dry.  Neurological:     General: No focal deficit present.     Mental Status: He is alert.  Psychiatric:        Mood and Affect: Mood normal.     ED Results / Procedures / Treatments   Labs (all labs ordered are listed, but only abnormal results are displayed) Labs Reviewed  BASIC METABOLIC PANEL - Abnormal; Notable for the following components:      Result Value   Glucose, Bld 106 (*)    Creatinine, Ser 1.31 (*)    GFR calc non Af Amer 51 (*)    GFR calc Af Amer 60 (*)    All other components within normal limits  CBC - Abnormal; Notable for the following components:   Platelets 130 (*)     All other components within normal limits  CBG MONITORING, ED - Abnormal; Notable for the following components:   Glucose-Capillary 101 (*)    All other components within normal limits  URINALYSIS, ROUTINE W REFLEX MICROSCOPIC    EKG None  Radiology No results found.  Procedures Procedures (including critical care time)  Medications Ordered in ED Medications - No data to display  ED Course  I have reviewed the triage vital signs and the nursing notes.  Pertinent labs & imaging results that were available during my care of the patient were reviewed by me and considered in my medical decision making (see chart for details).    MDM Rules/Calculators/A&P                          MDM:  Pt able to stand.  I discussed with his wife.  Pt has had similar episodes in the past.  Wife reports he was unresponsive.  Pt began responding after EMS arrived.  [name] care discussed with Dr. Eulis Foster Final Clinical Impression(s) / ED Diagnoses Final diagnoses:  Syncope, unspecified syncope type    Rx / DC Orders ED Discharge Orders    None       Fransico Meadow, [place] 02/28/20 2042

## 2020-02-28 NOTE — Discharge Instructions (Addendum)
For now decrease the metoprolol to 25 mg every morning.  Continue your other medicines as directed.  We sent a urine culture to check in for UTI and will contact you if he needs to be treated.  Typically unless you are having a fever or high white blood cell count, you do not need to be treated for UTI.  It is also important to follow-up with his nephrologist about the blood pressure medicine and his cardiologist about the cardiac monitoring that has been performed.  They may want you to be seen by a neurologist as well, which can be done at the [place], or here in [place].  Your doctors can help you with that.

## 2020-02-29 LAB — URINE CULTURE

## 2020-03-01 DIAGNOSIS — N390 Urinary tract infection, site not specified: Secondary | ICD-10-CM | POA: Diagnosis not present

## 2020-03-01 DIAGNOSIS — I10 Essential (primary) hypertension: Secondary | ICD-10-CM | POA: Diagnosis not present

## 2020-03-01 DIAGNOSIS — N183 Chronic kidney disease, stage 3 unspecified: Secondary | ICD-10-CM | POA: Diagnosis not present

## 2020-03-06 ENCOUNTER — Other Ambulatory Visit: Payer: Self-pay

## 2020-03-06 ENCOUNTER — Ambulatory Visit: Payer: Medicare PPO | Admitting: Physician Assistant

## 2020-03-06 ENCOUNTER — Encounter: Payer: Self-pay | Admitting: Physician Assistant

## 2020-03-06 VITALS — BP 148/98 | HR 66 | Ht 71.0 in | Wt 165.0 lb

## 2020-03-06 DIAGNOSIS — R55 Syncope and collapse: Secondary | ICD-10-CM | POA: Diagnosis not present

## 2020-03-06 DIAGNOSIS — I10 Essential (primary) hypertension: Secondary | ICD-10-CM | POA: Diagnosis not present

## 2020-03-06 DIAGNOSIS — Z8673 Personal history of transient ischemic attack (TIA), and cerebral infarction without residual deficits: Secondary | ICD-10-CM

## 2020-03-06 DIAGNOSIS — I471 Supraventricular tachycardia: Secondary | ICD-10-CM | POA: Diagnosis not present

## 2020-03-06 NOTE — Progress Notes (Signed)
Cardiology Office Note:    Date:  03/08/2020   ID:  Dustin Hamilton, DOB Dec 19, 1940, MRN [id number]  PCP:  [facility] [place] Cardiologist:  Quay Burow, MD  [facility] Electrophysiologist:  Cristopher Peru, MD   Referring MD: [facility] [place]   Chief Complaint  Patient presents with  . Follow-up    seen for Dr. Gwenlyn Found    History of Present Illness:    Dustin Hamilton is a 79 y.o. male with a hx of CVA, hypertension and PSVT. He presented to [facility] ED in December 2018 with PSVT and hypotension. PSVT was terminated with Valsalva maneuver. Last echocardiogram obtained on 06/09/2017 showed EF 60 to 65%, grade 1 DD, trivial MR. He has not had any recurrent episode. Myoview obtained on 06/12/2017 was negative. Since then, he has underwent AVNRT ablation by Dr. Lovena Le on 11/15/2018. He was last seen by Dr. Gwenlyn Found in August 2021 at which time he can complained of recurrent dizzy spell. Dr. Gwenlyn Found recommended 2 weeks ZIO monitor. Carvedilol was replaced with metoprolol and amlodipine recently. Patient presented to the ED on 02/20/2020 with syncope. EKG shows sinus bradycardia with heart rate of 44 bpm. Lab work was also concerning for moderate dehydration with creatinine up to 1.31 from the baseline of 0.9. Her monitor also showed sinus rhythm with sinus bradycardia, occasional PACs and PVCs, second-degree AV block and a 3.2-second pause.  Since the ED visit, apparently his nephrologist has stopped the previous metoprolol tartrate 50 mg twice daily and switched to metoprolol succinate 25 mg daily on 03/01/2020.  He has not had a true passing out spell since the ED release.  He did have an episode of weakness yesterday, however did not pass out.  He denies any recent chest pain or significant shortness of breath.  I am still concerned about poor oral intake and adequate fluid intake.  I recommended increased fluid intake and keep daily fluid intake to somewhere  between 32 to 48 ounces per day.  I agree with the lower dose of metoprolol.  I plan to see the patient back in 4 weeks for reassessment.  She did assess to send a report of her heart monitor result to her physician at [facility] hospital.  We also reviewed his recent heart monitor report as well, despite it there were several self triggered event, there was no underlying rhythm issue during the triggered episode.  Heart rate was between 50 to 70s beats per minute during triggered event.  The only prolonged pause of 3.2 seconds occurred around 5:30 AM when the patient was still asleep.  Past Medical History:  Diagnosis Date  . BPH (benign prostatic hyperplasia)   . Cerebrovascular disease   . Congenital anomaly of diaphragm   . Elevated PSA   . Glaucoma, both eyes   . Hemorrhoid   . Hepatitis B surface antigen positive    02-20-2011  . History of adenomatous polyp of colon    2007, 2009 and 2013  tubular adenoma's  . History of alcohol abuse    quit 1963  . History of cerebral parenchymal hemorrhage    01/ 2006  left occiptial lobe related to hypertensive crisis  . History of CVA (cerebrovascular accident)    09-12-2012  left hippocampus/ amygdala junction and per MRI old white matter infarcts--  per pt residual short- term memory issues  . History of fatty infiltration of liver hx visit's at [facility] , last visit 05/ 2014  elvated LFT's ,  via liver bx 2004 related to hx alcohol and drug abuse (quit 1964)  . 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 HTN (hypertension)   . Renal cyst, left   . Stroke ([place])    hx of 3 strokes in past   . 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 Urethral lesion    urethral mass    Past Surgical History:  Procedure Laterality Date  . CARDIOVASCULAR STRESS TEST  05/05/2007   normal nuclear study w/ no ischemia/  normal LV fucntion and wall motion , ef60%  . COLONOSCOPY  last one 04-06-2012   . CYSTO/  LEFT RETROGRADE PYELOGRAM/ CYTOLOGY WASHINGS/  URETEROSCOPY  03/05/2000  . INGUINAL HERNIA REPAIR Bilateral 1965 and 1980's  . LAPAROSCOPIC INGUINAL HERNIA WITH UMBILICAL HERNIA Right 32/67/1245  . LIVER BIOPSY  1980's and 2004  . SVT ABLATION N/A 11/15/2018   Procedure: SVT ABLATION;  Surgeon: Evans Lance, MD;  Location: [facility];  Service: Cardiovascular;  Laterality: N/A;  . TRANSTHORACIC ECHOCARDIOGRAM  09/13/2012   moderate LVH,  ef 60-65%/    . TRANSURETHRAL RESECTION OF BLADDER TUMOR N/A 08/11/2016   Procedure: TRANSURETHRAL RESECTION OF BLADDER TUMOR (TURBT);  Surgeon: Cleon Gustin, MD;  Location: [facility];  Service: Urology;  Laterality: N/A;    Current Medications: Current Meds  Medication Sig  . amLODipine (NORVASC) 5 MG tablet Take 5 mg by mouth daily.  Marland Kitchen aspirin EC 81 MG tablet Take 81 mg by mouth daily.  . brimonidine (ALPHAGAN) 0.2 % ophthalmic solution Place 1 drop into both eyes 2 (two) times daily.  . cetirizine (ZYRTEC) 5 MG chewable tablet Chew 1 tablet (5 mg total) by mouth daily.  . clopidogrel (PLAVIX) 75 MG tablet Take 1 tablet (75 mg total) by mouth daily with breakfast.  . donepezil (ARICEPT) 5 MG tablet Take 5 mg by mouth at bedtime.  . dutasteride (AVODART) 0.5 MG capsule Take 0.5 mg by mouth daily.  Marland Kitchen latanoprost (XALATAN) 0.005 % ophthalmic solution Place 1 drop into both eyes at bedtime.   . memantine (NAMENDA) 5 MG tablet Take 10 mg by mouth 2 (two) times daily.   . tamsulosin (FLOMAX) 0.4 MG CAPS capsule Take 0.4 mg by mouth at bedtime.      Allergies:   Penicillins   Social History   Socioeconomic History  . Marital status: Married    Spouse name: [name]  . Number of children: 1  . Years of education: College  . Highest education level: Not on file  Occupational History  . Occupation: Geophysicist/field seismologist  Tobacco Use  . Smoking status: Former Smoker    Packs/day: 1.00    Years: 5.00    Pack years:  5.00    Types: Cigarettes    Quit date: 11/30/1981    Years since quitting: 38.2  . Smokeless tobacco: Never Used  Vaping Use  . Vaping Use: Never used  Substance and Sexual Activity  . Alcohol use: No    Alcohol/week: 0.0 standard drinks    Comment: hx abuse -- quit:  1963  . 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 Sexual activity: Not on file  Other Topics Concern  . Not on file  Social History Narrative   Patient lives at home with his spouse.   Caffeine Use:  Tea, lots   Social Determinants of Health   Financial Resource Strain:   . Difficulty of Paying Living Expenses: Not on file  Food Insecurity:   . Worried About Charity fundraiser in the Last Year: Not on file  . Ran Out of Food in the Last Year: Not on file  Transportation Needs:   . Lack of Transportation (Medical): Not on file  . Lack of Transportation (Non-Medical): Not on file  Physical Activity:   . Days of Exercise per Week: Not on file  . Minutes of Exercise per Session: Not on file  Stress:   . Feeling of Stress : Not on file  Social Connections:   . Frequency of Communication with Friends and Family: Not on file  . Frequency of Social Gatherings with Friends and Family: Not on file  . Attends Religious Services: Not on file  . Active Member of Clubs or Organizations: Not on file  . Attends Archivist Meetings: Not on file  . Marital Status: Not on file     Family History: The patient's family history includes Diabetes in his maternal grandmother and mother; Heart attack in his father and mother; Heart disease in his maternal grandmother; Kidney failure in his mother; Rheum arthritis in his maternal grandmother and mother; Stroke in his maternal grandmother and mother. There is no history of Colon cancer.  ROS:   Please see the history of present illness.     All other systems reviewed and are negative.  EKGs/Labs/Other Studies Reviewed:    The following  studies were reviewed today:  Echo 06/09/2017 LV EF: 60% -  65%  Study Conclusions   - Left ventricle: The cavity size was normal. Systolic function was  normal. The estimated ejection fraction was in the range of 60%  to 65%. Wall motion was normal; there were no regional wall  motion abnormalities. There was an increased relative  contribution of atrial contraction to ventricular filling.  Doppler parameters are consistent with abnormal left ventricular  relaxation (grade 1 diastolic dysfunction). Doppler parameters  are consistent with high ventricular filling pressure.  - Mitral valve: There was trivial regurgitation.  - Pulmonary arteries: Systolic pressure could not be accurately  estimated.   EKG:  EKG is ordered today.  The ekg ordered today demonstrates normal sinus rhythm, left bundle branch block, no significant ST-T wave changes.  Recent Labs: 12/04/2019: ALT 20; TSH 0.833 02/28/2020: BUN 21; Creatinine, Ser 1.31; Hemoglobin 16.1; Platelets 130; Potassium 4.2; Sodium 141  Recent Lipid Panel    Component Value Date/Time   CHOL 123 09/13/2012 0330   TRIG 112 09/13/2012 0330   HDL 25 (L) 09/13/2012 0330   CHOLHDL 4.9 09/13/2012 0330   VLDL 22 09/13/2012 0330   LDLCALC 76 09/13/2012 0330    Physical Exam:    VS:  BP (!) 148/98   Pulse 66   Ht 5\' 11"  (1.803 m)   Wt 165 lb (74.8 kg)   BMI 23.01 kg/m     Wt Readings from Last 3 Encounters:  03/06/20 165 lb (74.8 kg)  01/27/20 166 lb 9.6 oz (75.6 kg)  12/28/19 167 lb 15.9 oz (76.2 kg)     GEN:  Well nourished, well developed in no acute distress HEENT: Normal NECK: No JVD; No carotid bruits LYMPHATICS: No lymphadenopathy CARDIAC: RRR, no murmurs, rubs, gallops RESPIRATORY:  Clear to auscultation without rales, wheezing or rhonchi  ABDOMEN: Soft, non-tender, non-distended MUSCULOSKELETAL:  No edema; No deformity  SKIN:  Warm and dry NEUROLOGIC:  Alert and oriented x 3 PSYCHIATRIC:  Normal  affect   ASSESSMENT:    1. Syncope and collapse   2. PSVT (paroxysmal supraventricular tachycardia) ([place])   3. Primary hypertension   4. H/O: CVA (cerebrovascular accident)    PLAN:    In order of problems listed above:  1. History of syncope: Beta-blocker was reduced due to concern of bradycardia and prolonged pauses.  We will reassess the patient in a few weeks.  2. History of PSVT: He is currently on the lowest dose of metoprolol succinate.  Beta-blocker was recently reduced due to bradycardia  3. Hypertension: Blood pressure borderline elevated today, I did not try to lower the blood pressure due to recent syncope  4. History of CVA: No recent recurrence.   Medication Adjustments/Labs and Tests Ordered: Current medicines are reviewed at length with the patient today.  Concerns regarding medicines are outlined above.  Orders Placed This Encounter  Procedures  . EKG 12-Lead   No orders of the defined types were placed in this encounter.   Patient Instructions  Medication Instructions:  No changes *If you need a refill on your cardiac medications before your next appointment, please call your pharmacy*   Lab Work: None ordered If you have labs (blood work) drawn today and your tests are completely normal, you will receive your results only by: Marland Kitchen MyChart Message (if you have MyChart) OR . A paper copy in the mail If you have any lab test that is abnormal or we need to change your treatment, we will call you to review the results.   Testing/Procedures: None ordered   Follow-Up: At [facility], you and your health needs are our priority.  As part of our continuing mission to provide you with exceptional heart care, we have created designated Provider Care Teams.  These Care Teams include your primary Cardiologist (physician) and Advanced Practice Providers (APPs -  Physician Assistants and Nurse Practitioners) who all work together to provide you with the care you  need, when you need it.  We recommend signing up for the patient portal called "MyChart".  Sign up information is provided on this After Visit Summary.  MyChart is used to connect with patients for Virtual Visits (Telemedicine).  Patients are able to view lab/test results, encounter notes, upcoming appointments, etc.  Non-urgent messages can be sent to your provider as well.   To learn more about what you can do with MyChart, go to [email or web address].    Your next appointment:   1 month(s)  The format for your next appointment:   In Person  Provider:   You will see one of the following Advanced Practice Providers on your designated Care Team:    Almyra Deforest, PA-C  Then, Quay Burow, MD will plan to see you again in 4 month(s).    Other Instructions Stay hydrated. Consume 32 - 48 oz of fluid per day     Hilbert Corrigan, [place]  03/08/2020 9:16 PM    [facility]

## 2020-03-06 NOTE — Patient Instructions (Signed)
Medication Instructions:  No changes *If you need a refill on your cardiac medications before your next appointment, please call your pharmacy*   Lab Work: None ordered If you have labs (blood work) drawn today and your tests are completely normal, you will receive your results only by: Marland Kitchen MyChart Message (if you have MyChart) OR . A paper copy in the mail If you have any lab test that is abnormal or we need to change your treatment, we will call you to review the results.   Testing/Procedures: None ordered   Follow-Up: At [facility], you and your health needs are our priority.  As part of our continuing mission to provide you with exceptional heart care, we have created designated Provider Care Teams.  These Care Teams include your primary Cardiologist (physician) and Advanced Practice Providers (APPs -  Physician Assistants and Nurse Practitioners) who all work together to provide you with the care you need, when you need it.  We recommend signing up for the patient portal called "MyChart".  Sign up information is provided on this After Visit Summary.  MyChart is used to connect with patients for Virtual Visits (Telemedicine).  Patients are able to view lab/test results, encounter notes, upcoming appointments, etc.  Non-urgent messages can be sent to your provider as well.   To learn more about what you can do with MyChart, go to [email or web address].    Your next appointment:   1 month(s)  The format for your next appointment:   In Person  Provider:   You will see one of the following Advanced Practice Providers on your designated Care Team:    Almyra Deforest, PA-C  Then, Quay Burow, MD will plan to see you again in 4 month(s).    Other Instructions Stay hydrated. Consume 32 - 48 oz of fluid per day

## 2020-03-08 ENCOUNTER — Encounter: Payer: Self-pay | Admitting: Physician Assistant

## 2020-03-15 DIAGNOSIS — H401122 Primary open-angle glaucoma, left eye, moderate stage: Secondary | ICD-10-CM | POA: Diagnosis not present

## 2020-03-15 DIAGNOSIS — H2512 Age-related nuclear cataract, left eye: Secondary | ICD-10-CM | POA: Diagnosis not present

## 2020-03-16 DIAGNOSIS — H2511 Age-related nuclear cataract, right eye: Secondary | ICD-10-CM | POA: Diagnosis not present

## 2020-04-03 DIAGNOSIS — N183 Chronic kidney disease, stage 3 unspecified: Secondary | ICD-10-CM | POA: Diagnosis not present

## 2020-04-03 DIAGNOSIS — I10 Essential (primary) hypertension: Secondary | ICD-10-CM | POA: Diagnosis not present

## 2020-04-05 DIAGNOSIS — N390 Urinary tract infection, site not specified: Secondary | ICD-10-CM | POA: Diagnosis not present

## 2020-04-05 DIAGNOSIS — N183 Chronic kidney disease, stage 3 unspecified: Secondary | ICD-10-CM | POA: Diagnosis not present

## 2020-04-05 DIAGNOSIS — I10 Essential (primary) hypertension: Secondary | ICD-10-CM | POA: Diagnosis not present

## 2020-04-13 ENCOUNTER — Ambulatory Visit: Payer: Medicare PPO | Admitting: Physician Assistant

## 2020-04-13 ENCOUNTER — Telehealth: Payer: Self-pay | Admitting: Radiology

## 2020-04-13 ENCOUNTER — Encounter: Payer: Self-pay | Admitting: Physician Assistant

## 2020-04-13 VITALS — BP 141/91 | HR 81 | Ht 71.0 in | Wt 164.4 lb

## 2020-04-13 DIAGNOSIS — I471 Supraventricular tachycardia: Secondary | ICD-10-CM | POA: Diagnosis not present

## 2020-04-13 DIAGNOSIS — Z8673 Personal history of transient ischemic attack (TIA), and cerebral infarction without residual deficits: Secondary | ICD-10-CM

## 2020-04-13 DIAGNOSIS — R42 Dizziness and giddiness: Secondary | ICD-10-CM

## 2020-04-13 DIAGNOSIS — I441 Atrioventricular block, second degree: Secondary | ICD-10-CM | POA: Diagnosis not present

## 2020-04-13 DIAGNOSIS — I10 Essential (primary) hypertension: Secondary | ICD-10-CM | POA: Diagnosis not present

## 2020-04-13 NOTE — Progress Notes (Signed)
Cardiology Office Note:    Date:  04/14/2020   ID:  Dustin Hamilton, DOB [birth year], MRN [id number]  PCP:  [facility], [place] Cardiologist:  Quay Burow, MD  [facility] Electrophysiologist:  Cristopher Peru, MD   Referring MD: [facility] [place]   Chief Complaint  Patient presents with  . Follow-up    seen for Dr. Gwenlyn Found    History of Present Illness:    Dustin Hamilton is a 79 y.o. male with a hx of CVA, hypertension and PSVT. He presented to [facility] ED in December 2018 with PSVT and hypotension. PSVT was terminated with Valsalva maneuver. Last echocardiogram obtained on 06/09/2017 showed EF 60 to 65%, grade 1 DD, trivial MR. He has not had any recurrent episode. Myoview obtained on 06/12/2017 was negative. Since then, he has underwent AVNRT ablation by Dr. Lovena Le on 11/15/2018. He was last seen by Dr. Gwenlyn Found in August 2021 at which time he can complained of recurrent dizzy spell. Dr. Gwenlyn Found recommended 2 weeks ZIO monitor. Carvedilol was replaced with metoprolol and amlodipine recently. Patient presented to the ED on 02/20/2020 with syncope. EKG shows sinus bradycardia with heart rate of 44 bpm. Lab work was also concerning for moderate dehydration with creatinine up to 1.31 from the baseline of 0.9. Heart monitor also showed sinus rhythm with sinus bradycardia, occasional PACs and PVCs, second-degree AV block and a 3.2-second pause.  Since the ED visit, apparently his nephrologist has stopped the previous metoprolol tartrate 50 mg twice daily and switched to metoprolol succinate 25 mg daily on 03/01/2020.  He has not had a true passing out spell since the ED release.   I last saw the patient on 03/06/2020, at which time I recommend increase fluid intake and food intake.  I agreed with the lower dose of metoprolol.  I also reviewed the heart monitor report with the patient.  Despite several self triggered event, there was no underlying rhythm issue during  the manually triggered episodes.  The only prolonged pause of 3.2 seconds occurred around 5:30 AM while the patient was still asleep.   Patient presents today along with his wife for reevaluation dizziness.  Since the previous visit, unfortunately he continued to have episodic dizziness.  He also has episodes where he has blank stares into the distance.  Upon further research, this is a chronic issue for him and that was previously evaluated by neurology Dr. [name].  I recommended he sees neurology again.  My suspicion that cardiac issue causing his symptom is fairly low.  I will discontinue his metoprolol to avoid any AV nodal blocking agent.  I also recommended a 2-week heart monitor to make sure Mobitz 1 heart block and pauses has completely resolved.  If his systolic blood pressure continue to increase and goes above 160 mmHg, he can increase amlodipine to 10 mg daily.    Past Medical History:  Diagnosis Date  . BPH (benign prostatic hyperplasia)   . Cerebrovascular disease   . Congenital anomaly of diaphragm   . Elevated PSA   . Glaucoma, both eyes   . Hemorrhoid   . Hepatitis B surface antigen positive    02-20-2011  . History of adenomatous polyp of colon    2007, 2009 and 2013  tubular adenoma's  . History of alcohol abuse    quit 1963  . History of cerebral parenchymal hemorrhage    01/ 2006  left occiptial lobe related to hypertensive crisis  . History  of CVA (cerebrovascular accident)    09-12-2012  left hippocampus/ amygdala junction and per MRI old white matter infarcts--  per pt residual short- term memory issues  . History of fatty infiltration of liver hx visit's at [facility] , last visit 05/ 2014   elvated LFT's ,  via liver bx 2004 related to hx alcohol and drug abuse (quit 1964)  . 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 HTN (hypertension)   . Renal cyst, left   . Stroke ([place])    hx of 3 strokes in past   . 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 Urethral lesion    urethral mass    Past Surgical History:  Procedure Laterality Date  . CARDIOVASCULAR STRESS TEST  05/05/2007   normal nuclear study w/ no ischemia/  normal LV fucntion and wall motion , ef60%  . COLONOSCOPY  last one 04-06-2012  . CYSTO/  LEFT RETROGRADE PYELOGRAM/ CYTOLOGY WASHINGS/  URETEROSCOPY  03/05/2000  . INGUINAL HERNIA REPAIR Bilateral 1965 and 1980's  . LAPAROSCOPIC INGUINAL HERNIA WITH UMBILICAL HERNIA Right 54/62/7035  . LIVER BIOPSY  1980's and 2004  . SVT ABLATION N/A 11/15/2018   Procedure: SVT ABLATION;  Surgeon: Evans Lance, MD;  Location: [facility];  Service: Cardiovascular;  Laterality: N/A;  . TRANSTHORACIC ECHOCARDIOGRAM  09/13/2012   moderate LVH,  ef 60-65%/    . TRANSURETHRAL RESECTION OF BLADDER TUMOR N/A 08/11/2016   Procedure: TRANSURETHRAL RESECTION OF BLADDER TUMOR (TURBT);  Surgeon: Cleon Gustin, MD;  Location: [facility];  Service: Urology;  Laterality: N/A;    Current Medications: Current Meds  Medication Sig  . amLODipine (NORVASC) 5 MG tablet Take 5 mg by mouth daily.  Marland Kitchen aspirin EC 81 MG tablet Take 81 mg by mouth daily.  . brimonidine (ALPHAGAN) 0.2 % ophthalmic solution Place 1 drop into both eyes 2 (two) times daily.  . cetirizine (ZYRTEC) 5 MG chewable tablet Chew 1 tablet (5 mg total) by mouth daily.  . clopidogrel (PLAVIX) 75 MG tablet Take 1 tablet (75 mg total) by mouth daily with breakfast.  . donepezil (ARICEPT) 5 MG tablet Take 5 mg by mouth at bedtime.  . dutasteride (AVODART) 0.5 MG capsule Take 0.5 mg by mouth daily.  Marland Kitchen latanoprost (XALATAN) 0.005 % ophthalmic solution Place 1 drop into both eyes at bedtime.   . meclizine (ANTIVERT) 25 MG tablet Take 25 mg by mouth 3 (three) times daily as needed for dizziness.  . memantine (NAMENDA) 5 MG tablet Take 10 mg by mouth 2 (two) times daily.   . tamsulosin (FLOMAX) 0.4 MG CAPS capsule  Take 0.4 mg by mouth at bedtime.   . [DISCONTINUED] metoprolol succinate (TOPROL-XL) 25 MG 24 hr tablet Take 25 mg by mouth every morning.     Allergies:   Penicillins   Social History   Socioeconomic History  . Marital status: Married    Spouse name: [name]  . Number of children: 1  . Years of education: College  . Highest education level: Not on file  Occupational History  . Occupation: Geophysicist/field seismologist  Tobacco Use  . Smoking status: Former Smoker    Packs/day: 1.00    Years: 5.00    Pack years: 5.00    Types: Cigarettes    Quit date: 11/30/1981    Years since quitting: 38.3  . Smokeless tobacco: Never Used  Vaping Use  . Vaping Use: Never used  Substance and Sexual Activity  . Alcohol use: No    Alcohol/week: 0.0 standard drinks    Comment: hx abuse -- quit:  1963  . 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 Sexual activity: Not on file  Other Topics Concern  . Not on file  Social History Narrative   Patient lives at home with his spouse.   Caffeine Use:  Tea, lots   Social Determinants of Health   Financial Resource Strain:   . Difficulty of Paying Living Expenses: Not on file  Food Insecurity:   . Worried About Charity fundraiser in the Last Year: Not on file  . Ran Out of Food in the Last Year: Not on file  Transportation Needs:   . Lack of Transportation (Medical): Not on file  . Lack of Transportation (Non-Medical): Not on file  Physical Activity:   . Days of Exercise per Week: Not on file  . Minutes of Exercise per Session: Not on file  Stress:   . Feeling of Stress : Not on file  Social Connections:   . Frequency of Communication with Friends and Family: Not on file  . Frequency of Social Gatherings with Friends and Family: Not on file  . Attends Religious Services: Not on file  . Active Member of Clubs or Organizations: Not on file  . Attends Archivist Meetings: Not on file  . Marital Status: Not on file      Family History: The patient's family history includes Diabetes in his maternal grandmother and mother; Heart attack in his father and mother; Heart disease in his maternal grandmother; Kidney failure in his mother; Rheum arthritis in his maternal grandmother and mother; Stroke in his maternal grandmother and mother. There is no history of Colon cancer.  ROS:   Please see the history of present illness.     All other systems reviewed and are negative.  EKGs/Labs/Other Studies Reviewed:    The following studies were reviewed today:  Echo 06/09/2017 LV EF: 60% -  65%  Study Conclusions   - Left ventricle: The cavity size was normal. Systolic function was  normal. The estimated ejection fraction was in the range of 60%  to 65%. Wall motion was normal; there were no regional wall  motion abnormalities. There was an increased relative  contribution of atrial contraction to ventricular filling.  Doppler parameters are consistent with abnormal left ventricular  relaxation (grade 1 diastolic dysfunction). Doppler parameters  are consistent with high ventricular filling pressure.  - Mitral valve: There was trivial regurgitation.  - Pulmonary arteries: Systolic pressure could not be accurately  estimated.    Myoview 06/12/2017  Nuclear stress EF: 63%.  The left ventricular ejection fraction is normal (55-65%).  The study is normal.  This is a low risk study.   Low risk stress nuclear study with normal perfusion and normal left ventricular regional and global systolic function.   EKG:  EKG is ordered today.  The ekg ordered today demonstrates normal sinus rhythm with poor R wave progression in the anterior leads.  Recent Labs: 12/04/2019: ALT 20; TSH 0.833 02/28/2020: BUN 21; Creatinine, Ser 1.31; Hemoglobin 16.1; Platelets 130; Potassium 4.2; Sodium 141  Recent Lipid Panel    Component Value Date/Time   CHOL 123 09/13/2012 0330   TRIG 112 09/13/2012 0330   HDL  25 (L) 09/13/2012 0330  CHOLHDL 4.9 09/13/2012 0330   VLDL 22 09/13/2012 0330   LDLCALC 76 09/13/2012 0330     Risk Assessment/Calculations:       Physical Exam:    VS:  BP (!) 141/91   Pulse 81   Ht 5\' 11"  (1.803 m)   Wt 164 lb 6.4 oz (74.6 kg)   SpO2 96%   BMI 22.93 kg/m     Wt Readings from Last 3 Encounters:  04/13/20 164 lb 6.4 oz (74.6 kg)  03/06/20 165 lb (74.8 kg)  01/27/20 166 lb 9.6 oz (75.6 kg)     GEN:  Well nourished, well developed in no acute distress HEENT: Normal NECK: No JVD; No carotid bruits LYMPHATICS: No lymphadenopathy CARDIAC: RRR, no murmurs, rubs, gallops RESPIRATORY:  Clear to auscultation without rales, wheezing or rhonchi  ABDOMEN: Soft, non-tender, non-distended MUSCULOSKELETAL:  No edema; No deformity  SKIN: Warm and dry NEUROLOGIC:  Alert and oriented x 3 PSYCHIATRIC:  Normal affect   ASSESSMENT:    1. Dizziness   2. PSVT (paroxysmal supraventricular tachycardia) ([place])   3. Primary hypertension   4. H/O: CVA (cerebrovascular accident)   5. AV block, Mobitz 1    PLAN:    In order of problems listed above:  1. Dizziness: He had an episode of syncope and was seen in the ED, since that he has not had any further syncope.  However he has been having some dizzy spell and episodes where he has blank stares.  On further investigation, this episodes of blank stares has happened in the past and that he was evaluated by Dr. [name] of neurology service.  He will need to be evaluated by neurology service again.  Previous heart monitor did not suggest episodes has true correlation with underlying second-degree heart block, however as a precaution, I recommended discontinue metoprolol.  We will run another heart monitor to make sure he does not have any further high degree conduction disorder.  2. History of PSVT: Underwent ablation procedure in 2020.  Has not had any further episode.  Given the recent dizziness and second-degree AV block on  the previous heart monitor, I recommended discontinuing metoprolol  3. Hypertension: Blood pressure stable, discontinue metoprolol to avoid AV nodal blocking effect and worsening conduction disorder, if systolic blood pressure is greater than 160, patient has been instructed to increase amlodipine to 10 mg daily.  4. History of CVA: No recent recurrence  5. Mobitz 1 AV block: Seen on previous heart monitor.  With the recent dizziness, I will discontinue metoprolol and write another 2-week heart monitor.    Shared Decision Making/Informed Consent        Medication Adjustments/Labs and Tests Ordered: Current medicines are reviewed at length with the patient today.  Concerns regarding medicines are outlined above.  Orders Placed This Encounter  Procedures  . Ambulatory referral to Neurology  . LONG TERM MONITOR (3-14 DAYS)   No orders of the defined types were placed in this encounter.   Patient Instructions  Medication Instructions:  STOP- Metoprolol  *If you need a refill on your cardiac medications before your next appointment, please call your pharmacy*   Lab Work: None ordered   Testing/Procedures: Your physician has recommended that you wear a Zio monitor in 2 weeks. Holter monitors are medical devices that record the heart's electrical activity. Doctors most often use these monitors to diagnose arrhythmias. Arrhythmias are problems with the speed or rhythm of the heartbeat. The monitor is a small, portable device. You  can wear one while you do your normal daily activities. This is usually used to diagnose what is causing palpitations/syncope (passing out).   Follow-Up: At [facility], you and your health needs are our priority.  As part of our continuing mission to provide you with exceptional heart care, we have created designated Provider Care Teams.  These Care Teams include your primary Cardiologist (physician) and Advanced Practice Providers (APPs -  Physician  Assistants and Nurse Practitioners) who all work together to provide you with the care you need, when you need it.  We recommend signing up for the patient portal called "MyChart".  Sign up information is provided on this After Visit Summary.  MyChart is used to connect with patients for Virtual Visits (Telemedicine).  Patients are able to view lab/test results, encounter notes, upcoming appointments, etc.  Non-urgent messages can be sent to your provider as well.   To learn more about what you can do with MyChart, go to [email or web address].    Your next appointment:   6 weeks  The format for your next appointment:   In Person  Provider:   You will see one of the following Advanced Practice Providers on your designated Care Team:    Almyra Deforest, [place] or Dr Gwenlyn Found   Other Instructions You have been referred to Dr [name]   If Systolic  blood pressure is consistently over 160 increase Amlodipine to 10 mg by and give office a call.      Hilbert Corrigan, [place]  04/14/2020 1:46 AM    [place]

## 2020-04-13 NOTE — Patient Instructions (Addendum)
Medication Instructions:  STOP- Metoprolol  *If you need a refill on your cardiac medications before your next appointment, please call your pharmacy*   Lab Work: None ordered   Testing/Procedures: Your physician has recommended that you wear a Zio monitor in 2 weeks. Holter monitors are medical devices that record the heart's electrical activity. Doctors most often use these monitors to diagnose arrhythmias. Arrhythmias are problems with the speed or rhythm of the heartbeat. The monitor is a small, portable device. You can wear one while you do your normal daily activities. This is usually used to diagnose what is causing palpitations/syncope (passing out).   Follow-Up: At [facility], you and your health needs are our priority.  As part of our continuing mission to provide you with exceptional heart care, we have created designated Provider Care Teams.  These Care Teams include your primary Cardiologist (physician) and Advanced Practice Providers (APPs -  Physician Assistants and Nurse Practitioners) who all work together to provide you with the care you need, when you need it.  We recommend signing up for the patient portal called "MyChart".  Sign up information is provided on this After Visit Summary.  MyChart is used to connect with patients for Virtual Visits (Telemedicine).  Patients are able to view lab/test results, encounter notes, upcoming appointments, etc.  Non-urgent messages can be sent to your provider as well.   To learn more about what you can do with MyChart, go to [email or web address].    Your next appointment:   6 weeks  The format for your next appointment:   In Person  Provider:   You will see one of the following Advanced Practice Providers on your designated Care Team:    Almyra Deforest, [place] or Dr Gwenlyn Found   Other Instructions You have been referred to Dr [name]   If Systolic  blood pressure is consistently over 160 increase Amlodipine to 10 mg by and give  office a call.

## 2020-04-13 NOTE — Telephone Encounter (Signed)
Enrolled patient for a 14 day Zio XT  monitor to be mailed to patients home  °

## 2020-04-14 ENCOUNTER — Encounter: Payer: Self-pay | Admitting: Physician Assistant

## 2020-04-18 ENCOUNTER — Other Ambulatory Visit (INDEPENDENT_AMBULATORY_CARE_PROVIDER_SITE_OTHER): Payer: Medicare PPO

## 2020-04-18 DIAGNOSIS — R42 Dizziness and giddiness: Secondary | ICD-10-CM | POA: Diagnosis not present

## 2020-05-01 NOTE — Addendum Note (Signed)
Addended by: Alvina Filbert B on: 05/01/2020 05:31 PM   Modules accepted: Orders

## 2020-05-03 ENCOUNTER — Other Ambulatory Visit: Payer: Self-pay

## 2020-05-03 ENCOUNTER — Observation Stay (HOSPITAL_COMMUNITY)
Admission: EM | Admit: 2020-05-03 | Discharge: 2020-05-06 | Disposition: A | Discharge status: Home / Self Care | Payer: Medicare PPO | Attending: Family Medicine | Admitting: Family Medicine

## 2020-05-03 ENCOUNTER — Emergency Department (HOSPITAL_COMMUNITY): Payer: Medicare PPO

## 2020-05-03 ENCOUNTER — Encounter (HOSPITAL_COMMUNITY): Payer: Self-pay | Admitting: Emergency Medicine

## 2020-05-03 ENCOUNTER — Observation Stay (HOSPITAL_COMMUNITY): Payer: Medicare PPO

## 2020-05-03 DIAGNOSIS — Z8673 Personal history of transient ischemic attack (TIA), and cerebral infarction without residual deficits: Secondary | ICD-10-CM

## 2020-05-03 DIAGNOSIS — R609 Edema, unspecified: Secondary | ICD-10-CM | POA: Diagnosis not present

## 2020-05-03 DIAGNOSIS — I129 Hypertensive chronic kidney disease with stage 1 through stage 4 chronic kidney disease, or unspecified chronic kidney disease: Secondary | ICD-10-CM | POA: Diagnosis not present

## 2020-05-03 DIAGNOSIS — N401 Enlarged prostate with lower urinary tract symptoms: Secondary | ICD-10-CM | POA: Diagnosis not present

## 2020-05-03 DIAGNOSIS — D696 Thrombocytopenia, unspecified: Secondary | ICD-10-CM | POA: Diagnosis not present

## 2020-05-03 DIAGNOSIS — R413 Other amnesia: Secondary | ICD-10-CM

## 2020-05-03 DIAGNOSIS — H409 Unspecified glaucoma: Secondary | ICD-10-CM | POA: Insufficient documentation

## 2020-05-03 DIAGNOSIS — Z8679 Personal history of other diseases of the circulatory system: Secondary | ICD-10-CM | POA: Diagnosis not present

## 2020-05-03 DIAGNOSIS — I119 Hypertensive heart disease without heart failure: Secondary | ICD-10-CM | POA: Diagnosis present

## 2020-05-03 DIAGNOSIS — Z87891 Personal history of nicotine dependence: Secondary | ICD-10-CM | POA: Insufficient documentation

## 2020-05-03 DIAGNOSIS — Z79899 Other long term (current) drug therapy: Secondary | ICD-10-CM | POA: Insufficient documentation

## 2020-05-03 DIAGNOSIS — R55 Syncope and collapse: Principal | ICD-10-CM | POA: Diagnosis present

## 2020-05-03 DIAGNOSIS — N189 Chronic kidney disease, unspecified: Secondary | ICD-10-CM | POA: Insufficient documentation

## 2020-05-03 DIAGNOSIS — Z20822 Contact with and (suspected) exposure to covid-19: Secondary | ICD-10-CM | POA: Insufficient documentation

## 2020-05-03 DIAGNOSIS — N179 Acute kidney failure, unspecified: Secondary | ICD-10-CM | POA: Diagnosis present

## 2020-05-03 DIAGNOSIS — I4892 Unspecified atrial flutter: Secondary | ICD-10-CM | POA: Diagnosis not present

## 2020-05-03 DIAGNOSIS — I472 Ventricular tachycardia: Secondary | ICD-10-CM | POA: Diagnosis not present

## 2020-05-03 DIAGNOSIS — I441 Atrioventricular block, second degree: Secondary | ICD-10-CM | POA: Insufficient documentation

## 2020-05-03 DIAGNOSIS — I639 Cerebral infarction, unspecified: Secondary | ICD-10-CM | POA: Diagnosis not present

## 2020-05-03 DIAGNOSIS — Z7982 Long term (current) use of aspirin: Secondary | ICD-10-CM | POA: Insufficient documentation

## 2020-05-03 DIAGNOSIS — I616 Nontraumatic intracerebral hemorrhage, multiple localized: Secondary | ICD-10-CM | POA: Diagnosis not present

## 2020-05-03 DIAGNOSIS — R972 Elevated prostate specific antigen [PSA]: Secondary | ICD-10-CM | POA: Diagnosis not present

## 2020-05-03 DIAGNOSIS — I6782 Cerebral ischemia: Secondary | ICD-10-CM | POA: Diagnosis not present

## 2020-05-03 LAB — CBC WITH DIFFERENTIAL/PLATELET
Abs Immature Granulocytes: 0.02 10*3/uL (ref 0.00–0.07)
Basophils Absolute: 0 10*3/uL (ref 0.0–0.1)
Basophils Relative: 1 %
Eosinophils Absolute: 0.1 10*3/uL (ref 0.0–0.5)
Eosinophils Relative: 2 %
HCT: 46.5 % (ref 39.0–52.0)
Hemoglobin: 15.1 g/dL (ref 13.0–17.0)
Immature Granulocytes: 0 %
Lymphocytes Relative: 13 %
Lymphs Abs: 0.7 10*3/uL (ref 0.7–4.0)
MCH: 30.7 pg (ref 26.0–34.0)
MCHC: 32.5 g/dL (ref 30.0–36.0)
MCV: 94.5 fL (ref 80.0–100.0)
Monocytes Absolute: 0.3 10*3/uL (ref 0.1–1.0)
Monocytes Relative: 5 %
Neutro Abs: 4.2 10*3/uL (ref 1.7–7.7)
Neutrophils Relative %: 79 %
Platelets: 136 10*3/uL — ABNORMAL LOW (ref 150–400)
RBC: 4.92 MIL/uL (ref 4.22–5.81)
RDW: 14.1 % (ref 11.5–15.5)
WBC: 5.2 10*3/uL (ref 4.0–10.5)
nRBC: 0 % (ref 0.0–0.2)

## 2020-05-03 LAB — URINALYSIS, ROUTINE W REFLEX MICROSCOPIC
Bilirubin Urine: NEGATIVE
Glucose, UA: NEGATIVE mg/dL
Hgb urine dipstick: NEGATIVE
Ketones, ur: NEGATIVE mg/dL
Nitrite: NEGATIVE
Protein, ur: 30 mg/dL — AB
Specific Gravity, Urine: 1.014 (ref 1.005–1.030)
pH: 5 (ref 5.0–8.0)

## 2020-05-03 LAB — COMPREHENSIVE METABOLIC PANEL
ALT: 16 U/L (ref 0–44)
AST: 19 U/L (ref 15–41)
Albumin: 3.3 g/dL — ABNORMAL LOW (ref 3.5–5.0)
Alkaline Phosphatase: 44 U/L (ref 38–126)
Anion gap: 10 (ref 5–15)
BUN: 16 mg/dL (ref 8–23)
CO2: 22 mmol/L (ref 22–32)
Calcium: 9.6 mg/dL (ref 8.9–10.3)
Chloride: 110 mmol/L (ref 98–111)
Creatinine, Ser: 1.38 mg/dL — ABNORMAL HIGH (ref 0.61–1.24)
GFR, Estimated: 52 mL/min — ABNORMAL LOW (ref >60)
Glucose, Bld: 133 mg/dL — ABNORMAL HIGH (ref 70–99)
Potassium: 3.4 mmol/L — ABNORMAL LOW (ref 3.5–5.1)
Sodium: 142 mmol/L (ref 135–145)
Total Bilirubin: 0.7 mg/dL (ref 0.3–1.2)
Total Protein: 6.7 g/dL (ref 6.5–8.1)

## 2020-05-03 LAB — RESP PANEL BY RT-PCR (FLU A&B, COVID) ARPGX2
Influenza A by PCR: NEGATIVE
Influenza B by PCR: NEGATIVE
SARS Coronavirus 2 by RT PCR: NEGATIVE

## 2020-05-03 LAB — TROPONIN I (HIGH SENSITIVITY)
Troponin I (High Sensitivity): 9 ng/L (ref <18)
Troponin I (High Sensitivity): 10 ng/L (ref <18)

## 2020-05-03 MED ORDER — ACETAMINOPHEN 650 MG RE SUPP: 650.0000 mg | Freq: Four times a day (QID) | RECTAL | Status: DC | PRN

## 2020-05-03 MED ORDER — DUTASTERIDE 0.5 MG PO CAPS
0.5000 mg | ORAL_CAPSULE | Freq: Every day | ORAL | Status: DC
  Administered 2020-05-04 – 2020-05-06 (×3): 0.5 mg via ORAL
  Filled 2020-05-03 (×3): qty 1

## 2020-05-03 MED ORDER — BRIMONIDINE TARTRATE 0.2 % OP SOLN
1.0000 [drp] | Freq: Two times a day (BID) | OPHTHALMIC | Status: DC
  Administered 2020-05-04 – 2020-05-06 (×5): 1 [drp] via OPHTHALMIC
  Filled 2020-05-03: qty 5

## 2020-05-03 MED ORDER — LATANOPROST 0.005 % OP SOLN
1.0000 [drp] | Freq: Every day | OPHTHALMIC | Status: DC
  Administered 2020-05-04 – 2020-05-05 (×2): 1 [drp] via OPHTHALMIC
  Filled 2020-05-03: qty 2.5

## 2020-05-03 MED ORDER — CLOPIDOGREL BISULFATE 75 MG PO TABS
75.0000 mg | ORAL_TABLET | Freq: Every day | ORAL | Status: DC
  Administered 2020-05-04 – 2020-05-05 (×2): 75 mg via ORAL
  Filled 2020-05-03 (×2): qty 1

## 2020-05-03 MED ORDER — ACETAMINOPHEN 325 MG PO TABS: 650.0000 mg | ORAL_TABLET | Freq: Four times a day (QID) | ORAL | Status: DC | PRN

## 2020-05-03 MED ORDER — TAMSULOSIN HCL 0.4 MG PO CAPS
0.4000 mg | ORAL_CAPSULE | Freq: Every day | ORAL | Status: DC
  Administered 2020-05-03 – 2020-05-05 (×3): 0.4 mg via ORAL
  Filled 2020-05-03 (×3): qty 1

## 2020-05-03 MED ORDER — ENOXAPARIN SODIUM 40 MG/0.4ML ~~LOC~~ SOLN
40.0000 mg | SUBCUTANEOUS | Status: DC
  Administered 2020-05-03 – 2020-05-04 (×2): 40 mg via SUBCUTANEOUS
  Filled 2020-05-03 (×2): qty 0.4

## 2020-05-03 MED ORDER — MEMANTINE HCL 5 MG PO TABS
17.5000 mg | ORAL_TABLET | Freq: Every day | ORAL | Status: DC
  Administered 2020-05-03 – 2020-05-05 (×3): 17.5 mg via ORAL
  Filled 2020-05-03 (×5): qty 2

## 2020-05-03 MED ORDER — ASPIRIN EC 81 MG PO TBEC
81.0000 mg | DELAYED_RELEASE_TABLET | Freq: Every day | ORAL | Status: DC
  Administered 2020-05-04 – 2020-05-05 (×2): 81 mg via ORAL
  Filled 2020-05-03 (×2): qty 1

## 2020-05-03 MED ORDER — AMLODIPINE BESYLATE 5 MG PO TABS
5.0000 mg | ORAL_TABLET | Freq: Every day | ORAL | Status: DC
  Administered 2020-05-03 – 2020-05-06 (×4): 5 mg via ORAL
  Filled 2020-05-03 (×4): qty 1

## 2020-05-03 MED ORDER — SODIUM CHLORIDE 0.9% FLUSH
3.0000 mL | Freq: Two times a day (BID) | INTRAVENOUS | Status: DC
  Administered 2020-05-03 – 2020-05-06 (×6): 3 mL via INTRAVENOUS

## 2020-05-03 MED ORDER — POLYETHYLENE GLYCOL 3350 17 G PO PACK: 17.0000 g | PACK | Freq: Every day | ORAL | Status: DC | PRN

## 2020-05-03 NOTE — Consult Note (Addendum)
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 Pt presents by EMS to [facility] ED with c/o "blacking out spell" at home this am. Pt states he was eating breakfast and was fine. His wife began to ask him questions and he was stuttering the answers and then he passed out at the table. Pt does not remember this event. His hx is what his wife told him. Wife dialed 911 and put pt in floor and when EMS arrived, pt was awake and asked "why am I on the floor?". No convulsive activity noted. Pt states he had no warning, like stars, etc., before he blacked out. At time of exam, patient states he feels back to his baseline and is asking to go home.   In review of the chart, pt has sought tx at ED multiple times for same events (4/21, 7/21, 9/21). He has been followed by cardiology for his SVT, AVB, and syncope spells. Cardiology sent pt to neuro outpt for 2nd opinion on the syncope and ? seizure activity reported as blank stares. However, when his BP meds were adjusted, he had no further spells of staring episodes. At 04/22/20 cardiology visit, he was taken off his Metoprolol due to orthostasis and placed on a 2 week Holter monitor. He turned the monitor in yesterday and the results are pending.    In review of outpt neuro note, pt was seen due to these episodes of staring. His MMSE was 28 and pt was started on Aricept and Namenda. Pt states he thinks he had an EEG, but is not sure and the outpt note does not mention this.   Pt relates that anytime he goes from sitting to standing, he gets dizzy. He recovers after a few seconds. He says he was dx'd with vertigo in the past but states this dizziness is different  than the vertigo.     Past Medical History    Past Medical History:  Diagnosis Date  . BPH (benign prostatic hyperplasia)   . Cerebrovascular disease   . Congenital anomaly of diaphragm   . Elevated PSA   . Glaucoma, both eyes   . Hemorrhoid   . Hepatitis B surface antigen positive    02-20-2011  . History of adenomatous polyp of colon    2007, 2009 and 2013  tubular adenoma's  . History of alcohol abuse    quit 1963  . History of cerebral parenchymal hemorrhage    01/ 2006  left occiptial lobe related to hypertensive crisis  . History of CVA (cerebrovascular accident)    09-12-2012  left hippocampus/ amygdala junction and per MRI old white matter infarcts--  per pt residual short- term memory issues  . History of fatty infiltration of liver hx visit's at [facility] , last visit 05/ 2014   elvated LFT's ,  via liver bx 2004 related to hx alcohol and drug abuse (quit 1964)  . 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 HTN (hypertension)   . Renal cyst, left   . Stroke ([place])    hx of 3 strokes in  past   . 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 Urethral lesion    urethral mass     Past Surgical History   Past Surgical History:  Procedure Laterality Date  . CARDIOVASCULAR STRESS TEST  05/05/2007   normal nuclear study w/ no ischemia/  normal LV fucntion and wall motion , ef60%  . COLONOSCOPY  last one 04-06-2012  . CYSTO/  LEFT RETROGRADE PYELOGRAM/ CYTOLOGY WASHINGS/  URETEROSCOPY  03/05/2000  . INGUINAL HERNIA REPAIR Bilateral 1965 and 1980's  . LAPAROSCOPIC INGUINAL HERNIA WITH UMBILICAL HERNIA Right 84/16/6063  . LIVER BIOPSY  1980's and 2004  . SVT ABLATION N/A 11/15/2018   Procedure: SVT ABLATION;  Surgeon: Evans Lance, MD;  Location: [facility];  Service: Cardiovascular;  Laterality: N/A;  . TRANSTHORACIC ECHOCARDIOGRAM  09/13/2012   moderate LVH,  ef 60-65%/    . TRANSURETHRAL RESECTION  OF BLADDER TUMOR N/A 08/11/2016   Procedure: TRANSURETHRAL RESECTION OF BLADDER TUMOR (TURBT);  Surgeon: Cleon Gustin, MD;  Location: [facility];  Service: Urology;  Laterality: N/A;     Family History   Family History  Problem Relation Age of Onset  . Rheum arthritis Mother   . Diabetes Mother   . Stroke Mother   . Heart attack Mother   . Kidney failure Mother   . Heart attack Father   . Heart disease Maternal Grandmother   . Rheum arthritis Maternal Grandmother   . Diabetes Maternal Grandmother   . Stroke Maternal Grandmother   . Colon cancer Neg Hx     Social History  Social History:  reports that he quit smoking about 38 years ago. His smoking use included cigarettes. He has a 5.00 pack-year smoking history. He has never used smokeless tobacco. He reports that he does not drink alcohol and does not use drugs.   Allergies  Allergies:  Allergies  Allergen Reactions  . Penicillins Hives    Has patient had a PCN reaction causing immediate rash, facial/tongue/throat swelling, SOB or lightheadedness with hypotension: Yes Has patient had a PCN reaction causing severe rash involving mucus membranes or skin necrosis: Yes Has patient had a PCN reaction that required hospitalization No Has patient had a PCN reaction occurring within the last 10 years: No If all of the above answers are "NO", then may proceed with Cephalosporin use.      Medications Prior to Admission  ASA 81mg , Plavix 75mg , Namenda, Aricept, Latanoprost, Meclizine, Avodart,  and Flomax.    ROS  History obtained from the patient General ROS: negative for - chills, fatigue, fever, night sweats, weight gain or weight loss Psychological ROS: negative for - behavioral disorder, hallucinations, memory difficulties, mood swings or suicidal ideation Ophthalmic ROS: pt can not see well out of left eye. Has central vision. No eye redness or pain. No complete loss of vision.  ENT ROS: negative for -  epistaxis, nasal discharge, oral lesions, sore throat, tinnitus or vertigo Allergy and Immunology ROS: negative for - hives or itchy/watery eyes Hematological and Lymphatic ROS: negative for - bleeding problems, bruising or swollen lymph nodes Endocrine ROS: negative for - polydipsia/polyuria or temperature intolerance Respiratory ROS: negative for - cough, hemoptysis, shortness of breath or wheezing Cardiovascular ROS: negative for - chest pain, dyspnea on exertion, edema. Hx SVT. No palpitations.  Gastrointestinal ROS: negative for - abdominal pain, diarrhea, hematemesis, nausea/vomiting or stool incontinence Genito-Urinary ROS: negative for hematuria, + nocturia and urgency.  Musculoskeletal ROS: negative for -  joint swelling or muscular weakness Neurological ROS: as noted in HPI Dermatological ROS: negative for rash and skin lesion changes   Physical Examination          Vitals:    08/23/17 1445 08/23/17 1515 08/23/17 1545 08/23/17 1600  BP: 103/68 (!) 93/58 118/76 (!) 120/101  Pulse: (!) 58 (!) 56 (!) 59 (!) 59  Resp: 17 15 16 14   Temp:          TempSrc:          SpO2: 98% 99% 100% 100%  Weight:          Height:            HEENT-  Normocephalic,  Cardiovascular - Regular rate and rhythm  Respiratory -  Non-labored breathing, No wheezing. Abdomen - soft and non-tender, BS normal Extremities- no edema or cyanosis Skin-warm and dry   Neurological Examination  Blood pressure (!) 150/91, pulse 61, temperature 97.7 F (36.5 C), temperature source Oral, resp. rate 17, SpO2 99 %.  HEENT-  Normocephalic, no lesions, without obvious abnormality.  Normal external eye and conjunctiva.  Cardiovascular- RRR on tele. Lungs-Normal respiratory effort. O2 sat WDL.  Extremities- Warm, dry and intact Musculoskeletal-no joint tenderness, deformity or swelling Skin-warm and dry  Neurological Examination Mental Status: Alert, oriented, thought content appropriate.  Speech fluent without  evidence of aphasia.  Able to follow commands without difficulty. Remembered 2/3 objects with ~15 minute delay; the 3rd could be recalled with category clue Cranial Nerves: II: Visual fields decreased in OS testing due to glaucoma. Has tunnel vision OS.   III,IV, VI: ptosis not present, extra-ocular motions intact bilaterally, pupils equal, round, reactive to light. + arcus senilis.  V,VII: smile symmetric, facial light touch sensation normal bilaterally VIII: hearing normal bilaterally IX,X: uvula rises symmetrically XI: bilateral shoulder shrug XII: midline tongue extension Speech/Language: Speech fluent and clear. No dysarthria. Comprehension intact.  Motor:Tone and bulk:normal tone throughout; no atrophy noted.  Sensory: light touch intact throughout, bilaterally Deep Tendon Reflexes: 2+ biceps and patellar bilaterally  Plantars: Right: downgoing     Left: downgoing Cerebellar: normal finger-to-nose bilaterally, but on the left, the examiner has to hold finger in center, normal rapid alternating movements  On attending exam later, he was able to meet finger on the left side too  Gait: deferred   Laboratory Results   Recent Labs  Lab 05/03/20 1400  NA 142  K 3.4*  CL 110  CO2 22  GLUCOSE 133*  BUN 16  CREATININE 1.38*  CALCIUM 9.6  TSH 7/21 .8333, LDL 2014 76   Imaging Results   Imaging: CT personally reviewed 1/06 with cerebral parenchymal hemorrhage secondary to HTN crisis.  MRI personally reviewed 2014-CVA at left hippocampus /amygdala junction.   MRI personally reviewed 05/03/2020:  No acute intracranial abnormality.  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 No seizures or epileptiform discharges were seen throughout the recording.   ASSESSMENT  79 yo  male with a PMHx of CVA 2014, recurrent syncope, AVB, SVT s/p ablation, "staring spells", HTN, and dementia. Is on Plavix and ASA. Had syncope episode this am at breakfast and at time of exam he is back to his reported baseline. States he always gets dizzy when he goes from sitting to standing position. This resolves quickly. Recently taken off Metoprolol thinking this  was leading to orthostasis and possibly contributing to syncopal episodes. He has worn a Holter over past 2 weeks and results are pending. He has also seen outpt neuro for staring spells and it is unclear if he had an EEG at that time.   IMPRESSION: 1. Syncopal episode, recurrent 2. SVT s/p ablation. 3. Dizziness with changing of position, orthostatic hypotension likely 4. AVB 5. Hx "staring spells"   PLAN  Blood pressure (!) 150/91, pulse 61, temperature 97.7 F (36.5 C), temperature source Oral, resp. rate 17, SpO2 99 %.  1. EEG stat to r/o seizure as a cause of syncope 2. MRI brain to evaluate for pathological causes of syncope 3. Orthostatic VS q 4 x 2.  4. Seizure precautions.  5. If d/c'd, immediate f/up with Cardiology and 4-6 weeks with neuro.  6. Continue outpt ophthamology.  7. Recommend TSH.  We will weigh in after MRI and EEG.    Clance Boll, NP/neuro. Pt to be seen by Neuro MD and note/plan to be edited as necessary by MD.   Attending Neurologist's note:  I personally saw this patient, gathering history, performing a full neurologic examination, reviewing relevant labs, personally reviewing relevant imaging as detailed above, and formulated the assessment and plan, adding the note above for completeness and clarity to accurately reflect my thoughts with some additional comments below   The patient describes to me that his episodes of dizziness are always associated with sudden movements, even nodding his head too quickly for example.  He denies that he experiences the room spinning, he just feels off  balance and might see some floaters.  He denies nausea vomiting or diaphoresis but then notes that the ED provider mentioned to him that his T-shirt was soaked with sweat today and he had not noticed this himself.  I asked him to demonstrate the type of head movement that would elicit 1 of these episodes to me.  He sat up in bed then quickly jerked his head to the right.  He was gripping the bed rails on both sides and then laid back stating "I do not like that" he was very slow to respond for the next few minutes, but was interactive.  He was having trouble remembering exactly what had happened and what was going on.  He told me he wanted to go to sleep but then also told me he wanted to get up.  As he recovered he told me that during these episodes "anything might come out of my mouth" he endorsed feeling very unstable and that he would fall if he did get up. --This event is nonepileptic but suggestive perhaps of vestibular dysfunction.  Referral to vestibular clinic for specialized vestibular testing on an outpatient basis may be useful, defer to outpatient neurologist  I additionally called his wife for history.  She noted that the event today was different than any prior events.  She says they were both sitting after breakfast and he started nodding his head as if he was falling asleep.  His phone fell down and hit the floor and he did not seem to notice.  Therefore she asked him if he was asleep and he responded "yeah yeah I am asleep" in a slow and confused way.  He then began to make a funny noise like he was "blowing his nose" and had some mucus come out of his nose.  He additionally had some chewing mouth movements.  These stopped and he was unresponsive.  It was not  until she lowered him to the floor that he began to come around.  He was significantly confused after this episode and the confusion has perhaps been slowly clearing over the course of the day. --This event has features concerning for a  temporal lobe seizure (altered breathing pattern, chewing mouth movements).  His prior hippocampal stroke may put him at increased risk of temporal lobe epilepsy.  The EEG pattern of focal slowing is unfortunately nonspecific and may or may not indicate underlying epilepsy.  Additionally his wife reports this is the only episode in which he has had these types of mouth movements and breathing, and she is reluctant to add another medication to his list, especially one that might make him more sedated.  She would prefer to discuss further with his outpatient neurologist befor starting a medication, which is very reasonable  Finally, the patient certainly remains at risk for cardiogenic syncopal events as well.  In summary, I believe this patient has multiple different spells, some of which may be cardiogenic, some of which may be due to vestibular dysfunction (which again could be a complication of prior brainstem strokes), and this most recent spell which is concerning for potential temporal lobe epilepsy.  Given his memory impairments, greatly appreciate his wife's assistance in logging and tracking his spell types to determine the best management.  At his wife's request, will hold on starting any medications now and recommend close outpatient follow-up with Dr. [name] (order placed).  Could consider elective admission to [facility], EMU for long-term monitoring in the future as prolonged EEG has a much higher sensitivity for interictal epileptogenic activity than a routine EEG; this may help clarify likelihood of epilepsy in this patient.  Lesleigh Noe MD-PhD [facility] [phone]

## 2020-05-03 NOTE — ED Notes (Signed)
MD at bedside. 

## 2020-05-03 NOTE — ED Triage Notes (Signed)
Pt here from home with c/o syncopal episode times 2 , once in his chair and once with ems , pt doesn't remember the event no complaints at present , cbg 158 , received 500 of fluid from ems

## 2020-05-03 NOTE — H&P (Addendum)
[facility] Admission History and Physical Service Pager: [phone]  Patient name: Dustin Hamilton Medical record number: [id number] Date of birth: 1941/02/11 Age: 79 y.o. Gender: male  Primary Care Provider: [facility] Consultants: Neurology Code Status: Full Preferred Emergency Contact: [name], wife, [phone]  Chief Complaint: Syncope   Assessment and Plan: Dustin Hamilton is a 79 y.o. male presenting s/p witnessed syncopal episode . PMH is significant for CVA, HTN, pSVT with AVNRT ablation in 11/2018, Mobitz Type 1 AV block, dizziness.   Syncope: acute, resolved  Patient with one witnessed syncopal episode today around 1130AM. Patient with history of chronic dizziness for which he has seen neurology and cardiology. Patient with similar episodes in the past, most recently in September. Marland Kitchen He was recently evaluated by Cardiology on 11/13 for follow-up on his chronic dizziness. Was advised to wear Zio patch for 2 weeks. Unfortunately, his wife had removed this patch about an hour prior to his syncopal episode today. Has worn a previous heart monitor which did not show the episodes correlating with underlying second-degree heart block but as a precaution his metoprolol was discontinued at his most recent cardiology appointment. 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  Patient denies any symptoms with his previous SVT episodes though continues to have chronic dizziness with episodes lasting less than an hour.  Patient denies chest pain, dizziness or any other symptoms prior to event today. Differential includes arrhythmia, MI, PE, orthostasis, TIA, hypoglycemia. Unfortunately unable to asses whether patient had arrhythmia during this episode as Zio patch was removed. EKG without ST changes and negative Troponin's making MI less likely. No calf pain/swelling, difficulty  breathing, fever or tachycardia to suggest DVT/PE. Patient without prior history of DM and with glucose of 133 on admission. Does have history of prior CVA in 2014, he is back to baseline on our exam. Cannot exclude TIA. Neurology to see patient. Given his history, cardiac etiology is most likely cause.  Plan to order echo and have cardiology see in AM. - Admit for observation med-tele, attending Dr. Owens Shark - Continuous cardiac monitoring, pulse ox - Orthostatic vital signs - F/u MRI brain  - AM CBC, BMP - F/u TSH  - Follow up Echo - Follow up EEG results - Neurology consult, appreciate recommendations - Consider cardiology consult tomorrow, pending echo results - Vitals per floor protocol  - PT/OT eval and treat   HTN: chronic, stable Patient with history of HTN. On Amlodipine 5mg . Was seen by Cardiology PA-C last month and advised to increase to 10 mg if home BP's > 416 systolic. Per his wife, has not been increased at home.  BP stable in ED with one elevated reading of 150/91.  - Continue Amlodipine 5 mg daily  - Vitals per floor protocol   Elevated Creatinine: chronic, stable Creatinine 1.38 on admission. Previous creatinine of 1.31 in September after last syncopal episode. It appears that his baseline is around 1.1. This would not meet the criteria for AKI as creatinine is not >1.5x increased from baseline.  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 Gwenlyn Found and Dr. Lovena Le with [facility]. Seen most recently on 04/14/20. Metoprolol was discontinued at this visit due to dizziness. Patient had Zio patch from 11/12 to this AM, when he removed prior to episode. - Monitor telemetry  - Cardiology  consult in AM pending Echo result  Thrombocytopenia Plt 136 on admission.  Have appeared intermittently decreased.  Lowest in 2019 at 118. - f/u outpatient   BPH: chronic, stable On Dutasteride 0.5 mg daily  and Tamsulosin 0.4 mg daily. Patient states that he has been out of his medications for the last two days. He is scheduled for a refill on Monday. Denies any urinary difficulties at this time. He has a history of elevated PSA, in chart appears in 2013 was 5.8.  - ensure that he follows with urology at [facility] - Continue Dutasteride 0.5 mg daily - Continue Tamsulosin 0.4 mg daily   History of CVA in 2014 Followed by Neurologist, Dr. [name]. Neuro exam unremarkable today. Does endorse some difficulty with ambulating for which he has a HH aid. - PT/OT eval and treat - Continue Plavix 75 mg daily with breakfast  - Continue ASA 81 mg daily   Glaucoma: chronic, stable Reports vision difficulties in left eye.  Left pupil non-reactive on exam with decreased left visual field, can see fingers at 2 feet. - Continue Brimonidine 0.2% ophthalmic solution 1 drop both eyes BID  - Continue Latanoprost 0.005% ophthalmic solution, 1 drop both eyes QHS   Memory Impairment: chronic, stable Patient is A&Ox4, but does not recall some of his medications and states that wife usually takes care of this.  He has been seen by neurology for this as well as "staring spells."  Currently on Namenda 17.5mg  QHS per wife's report.  He is no longer on Aricept. - Continue Namenda 17.5 mg daily at bedtime   FEN/GI: Regular diet  Prophylaxis: Lovenox  Disposition: Med-tele   History of Present Illness:  Dustin Hamilton is a 79 y.o. male presenting with syncope.  This AM he sat at the table and ate food.  He had finished eating, his [facility] aide had left.  He doesn't remember anything after that.  Had a reported, witnessed sycopal event by his wife.  Patient reports that when he woke up the fire dept had him on the floor.  He states that his wife told him that he "blanked out" which he doesn't remember.  This happened around 11:30AM.  He was then brought to ED by EMS.    He doesn't remember what he did yesterday.  States that  he thinks he had a dizzy spell yesterday and states, "I have them quite often.  Every day.  I usually have to lay on the bed until it passes.  I've never known it to last an hour.  I'd say around 45 min it will go away."  He states that he feels lightheaded and feels like he has to pass out when these episodes occur. They resolve spontaneously with time.  He reports that he had an episode that was similar in September and was seen in ED.  Last saw his cardiologist in November.  He had some medication changes.  He was taken off a BP pill (metoprolol).  Reports that he has been following their recommendations, but wife knows his medications more.  He started wearing a Zio Patch 2 weeks ago and his wife took it off today before his episode.   He has a neurologist in the [place] system.  He had been following for starring spells and memory issues. Has a follow up appointment on December 28th.  Has an aide that comes 3x a week from [place].  At present, patient feels completely back to his normal self.  He  has balance issues at baseline. He walks with a walker.  No recent falls.  Reports that he has been taking his medications, but the past two days hasn't been able to take his prostate pills because he ran out, otherwise he is compliant. His wife manages his medications.   Per wife: Patient was sitting at the table with his wife when he began to start nodding off. Then his phone began to fall. He started breathing differently, began coughing. Mucus came out of his nose. He was no longer responsive to verbal. Wife called EMS and wife was told to get patient to the floor. Wife reports that patient was unresponsive for about 5 minutes. Similar episode occurred in September.   She states that he has been having episodes where he "stares off" and has also been having memory problems for which he sees a neurologist.  His cardiologist had recommended a f/u appt with them after their most recent visit, but she was unable  to schedule until 12/28.   Review Of Systems: Per HPI with the following additions:   Review of Systems  Constitutional: Negative for fever.  HENT: Negative for congestion and sore throat.   Eyes: Negative for pain.  Respiratory: Negative for cough and shortness of breath.   Cardiovascular: Negative for chest pain, palpitations and leg swelling.  Gastrointestinal: Negative for abdominal pain, constipation, diarrhea, nausea and vomiting.  Genitourinary: Negative for difficulty urinating.  Neurological: Positive for syncope. Negative for dizziness and headaches.     Patient Active Problem List   Diagnosis Date Noted  . Atypical chest pain 01/27/2020  . SVT (supraventricular tachycardia) ([place]) 06/03/2017  . Dizziness 08/02/2014  . 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 CVA (cerebral infarction) 09/13/2012  . Vertigo 09/12/2012  . EAR PAIN 09/05/2009  . BACK PAIN 06/20/2009  . COLONIC POLYPS 05/06/2007  . 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 NONDEPENDENT ALCOHOL ABUSE IN REMISSION 05/06/2007  . HYPERTENSION, SEVERE 05/06/2007  . Hypertensive heart disease without heart failure 05/06/2007  . INTRACRANIAL HEMORRHAGE 05/06/2007  . Cerebrovascular disease 05/06/2007  . HEMORRHOIDS 05/06/2007  . INGUINAL HERNIA, RIGHT 05/06/2007  . RENAL CYST 05/06/2007  . HEMATURIA UNSPECIFIED 05/06/2007  . 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 PSA, INCREASED 05/06/2007  . LIVER FUNCTION TESTS, ABNORMAL 05/06/2007  . BPH (benign prostatic hypertrophy) with urinary obstruction 05/06/2007  . PERS HX NONCOMPLIANCE W/MED TX PRS HAZARDS HLTH 05/06/2007    Past Medical History: Past Medical History:  Diagnosis Date  . BPH (benign prostatic hyperplasia)   . Cerebrovascular disease   . Congenital anomaly of diaphragm   . Elevated PSA   . Glaucoma, both eyes   . Hemorrhoid   . Hepatitis B surface antigen positive    02-20-2011  . History of adenomatous polyp of colon    2007,  2009 and 2013  tubular adenoma's  . History of alcohol abuse    quit 1963  . History of cerebral parenchymal hemorrhage    01/ 2006  left occiptial lobe related to hypertensive crisis  . History of CVA (cerebrovascular accident)    09-12-2012  left hippocampus/ amygdala junction and per MRI old white matter infarcts--  per pt residual short- term memory issues  . History of fatty infiltration of liver hx visit's at [facility] , last visit 05/ 2014   elvated LFT's ,  via liver bx 2004 related to hx alcohol and drug abuse (quit 1964)  . 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 HTN (hypertension)   . Renal cyst, left   . Stroke ([place])    hx of 3 strokes in past   . 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 Urethral lesion    urethral mass    Past Surgical History: Past Surgical History:  Procedure Laterality Date  . CARDIOVASCULAR STRESS TEST  05/05/2007   normal nuclear study w/ no ischemia/  normal LV fucntion and wall motion , ef60%  . COLONOSCOPY  last one 04-06-2012  . CYSTO/  LEFT RETROGRADE PYELOGRAM/ CYTOLOGY WASHINGS/  URETEROSCOPY  03/05/2000  . INGUINAL HERNIA REPAIR Bilateral 1965 and 1980's  . LAPAROSCOPIC INGUINAL HERNIA WITH UMBILICAL HERNIA Right 01/13/4817  . LIVER BIOPSY  1980's and 2004  . SVT ABLATION N/A 11/15/2018   Procedure: SVT ABLATION;  Surgeon: Evans Lance, MD;  Location: [facility];  Service: Cardiovascular;  Laterality: N/A;  . TRANSTHORACIC ECHOCARDIOGRAM  09/13/2012   moderate LVH,  ef 60-65%/    . TRANSURETHRAL RESECTION OF BLADDER TUMOR N/A 08/11/2016   Procedure: TRANSURETHRAL RESECTION OF BLADDER TUMOR (TURBT);  Surgeon: Cleon Gustin, MD;  Location: [facility];  Service: Urology;  Laterality: N/A;    Social History: Social History   Tobacco Use  . Smoking status: Former Smoker    Packs/day: 1.00    Years: 5.00    Pack years: 5.00    Types:  Cigarettes    Quit date: 11/30/1981    Years since quitting: 38.4  . Smokeless tobacco: Never Used  Vaping Use  . Vaping Use: Never used  Substance Use Topics  . Alcohol use: No    Alcohol/week: 0.0 standard drinks    Comment: hx abuse -- quit:  1963  . Drug use: No    Comment: hx abuse -- quit 1964 (iv heroin and cocaine   Please also refer to relevant sections of EMR.  Family History: Family History  Problem Relation Age of Onset  . Rheum arthritis Mother   . Diabetes Mother   . Stroke Mother   . Heart attack Mother   . Kidney failure Mother   . Heart attack Father   . Heart disease Maternal Grandmother   . Rheum arthritis Maternal Grandmother   . Diabetes Maternal Grandmother   . Stroke Maternal Grandmother   . Colon cancer Neg Hx     Allergies and Medications: Allergies  Allergen Reactions  . Penicillins Hives    Has patient had a PCN reaction causing immediate rash, facial/tongue/throat swelling, SOB or lightheadedness with hypotension: Yes Has patient had a PCN reaction causing severe rash involving mucus membranes or skin necrosis: Yes Has patient had a PCN reaction that required hospitalization No Has patient had a PCN reaction occurring within the last 10 years: No If all of the above answers are "NO", then may proceed with Cephalosporin use.    No current facility-administered medications on file prior to encounter.   Current Outpatient Medications on File Prior to Encounter  Medication Sig Dispense Refill  . amLODipine (NORVASC) 5 MG tablet Take 5 mg by mouth daily.    Marland Kitchen aspirin EC 81 MG tablet Take 81 mg by mouth daily.    . brimonidine (ALPHAGAN) 0.2 % ophthalmic solution Place 1 drop into both eyes 2 (two) times daily.    . cetirizine (ZYRTEC) 5 MG chewable tablet Chew 1 tablet (5 mg total) by mouth daily. 20 tablet 0  .  clopidogrel (PLAVIX) 75 MG tablet Take 1 tablet (75 mg total) by mouth daily with breakfast. 30 tablet 11  . donepezil (ARICEPT) 5 MG  tablet Take 5 mg by mouth at bedtime.    . dutasteride (AVODART) 0.5 MG capsule Take 0.5 mg by mouth daily.    Marland Kitchen latanoprost (XALATAN) 0.005 % ophthalmic solution Place 1 drop into both eyes at bedtime.     . meclizine (ANTIVERT) 25 MG tablet Take 25 mg by mouth 3 (three) times daily as needed for dizziness.    . memantine (NAMENDA) 5 MG tablet Take 10 mg by mouth 2 (two) times daily.     . tamsulosin (FLOMAX) 0.4 MG CAPS capsule Take 0.4 mg by mouth at bedtime.       Objective: BP 135/77 (BP Location: Right Arm)   Pulse 61   Temp 97.7 F (36.5 C) (Oral)   Resp 14   SpO2 97%  Exam: General: Pleasant, in NAD, conversational Eyes: EOMI, decreased visual field on left-side, non-reactive pupil on left, reactive on right Neck: supple Cardiovascular: RRR, no murmur appreciated Respiratory: CTAB, no wheezing/rhonchi/rales Gastrointestinal: soft, non-tender in all quadrants, normoactive bowel sounds, no rebound or guarding MSK: Moves all extremities spontaneously  Derm: b/l cold hands, no swelling or rashes, skin otherwise warm Neuro: AxOx4, CN 2-12 grossly intact, gait not assessed. Some difficulty with recall, 2+ biceps, brachioradialis, patellar DTR b/l, symmetrically absent achilles reflex, sensation intact throughout, 5/5 strength BUE/BLE Psych: Normal thought content and judgement, normal mood  Labs and Imaging: CBC BMET  Recent Labs  Lab 05/03/20 1400  WBC 5.2  HGB 15.1  HCT 46.5  PLT 136*   Recent Labs  Lab 05/03/20 1400  NA 142  K 3.4*  CL 110  CO2 22  BUN 16  CREATININE 1.38*  GLUCOSE 133*  CALCIUM 9.6     EKG: My own interpretation (not copied from electronic read)  Sinus Rhythm at 56 bpm with prolonged PR interval, no ST elevation.   Sharion Settler, DO 05/03/2020, 4:09 PM PGY-1, [place] Intern pager: [phone], text pages welcome  FPTS Upper-Level Resident Addendum   I have independently interviewed and examined the patient. I  have discussed the above with the original author and agree with their documentation. My edits for correction/addition/clarification are in green. Please see also any attending notes.   [place] Constable, D.O. PGY-3, [facility] 05/03/2020 7:55 PM  [place] Service pager: [phone] (text pages welcome through [facility])

## 2020-05-03 NOTE — Progress Notes (Addendum)
   FMTS Attending Admission Note: Dustin Singh, MD  Personal pager:  [phone] [place] Service Pager:  [phone]   I  have seen and examined this patient, reviewed their chart. I have discussed this patient with the resident.I will sign there note as it is available.  Briefly, 79 year old with history of SVT s/p ablation, cerebrovascular disease, dementia, hypertension, and BPH presenting with syncopal episode at home. The patient lives with his wife but has had worsening memory over the past few months. He has had episodes of near syncope with palpitations (2020) and underwent ablation in June 2020. In chart review, it appears he had return of syncope in spring 2021.   PMH reviewed--per chart has history stroke, HTN, cognitive impairment.  Has prior documentation of Hep B surface antigen positive--testing in 2012 shows positive antibody (immune)   PSH- Ablation June 2020, reviewed additional, PSA elevated in 2013, has had several urological procedures for this  Socially- lives with wife, was a Production manager- notable for hypertension Labs- Slight elevation in creatinine (although may be new baseline)  Thrombocytopenia  Troponin flat EKG- first degree AV block, unchanged from prior  Echocardiogram from 2019 showed normal EF    Cardiac: Regular rate and rhythm. Normal S1/S2. No murmurs, rubs, or gallops appreciated. Lungs: Clear bilaterally to ascultation.  Abdomen: Normoactive bowel sounds. No tenderness to deep or light palpation. No rebound or guarding. Psych: Pleasant and appropriate   Neuro: CN II: Pupils noted to be asymmetric, decreased vision in L eye  CN III, IV,VI: EOMI CV V: Normal sensation in V1, V2, V3 CVII: Symmetric smile and brow raise CN VIII: Normal hearing CN IX,X: Symmetric palate raise  CN XI: 5/5 shoulder shrug CN XII: Symmetric tongue protrusion  UE and LE strength 5/5 2+ UE and LE reflexes  Normal sensation in UE and LE bilaterally   1. Syncope,  most consistent with cardiogenic cause give history. [facility] input. Telemetry monitoring, echocardiogram. Also appreciate Neurology input. Orthostatic vital signs. Although donepezil can cause bradycardia (he is not taking this), memantidine can also  (rarely) cause this.  2. History of elevated PSA--will confirm he has followed up with Urology for this.

## 2020-05-03 NOTE — Procedures (Signed)
Patient Name: Dustin Hamilton  MRN: [id number]  Epilepsy Attending: Lora Havens  Referring Physician/Provider: Clance Boll, NP Date: 05/03/2020 Duration: 23.12 mins  Patient history: 79 yo male with a PMHx of CVA 2014, recurrent syncope, AVB, SVT s/p ablation with "staring spells" and syncope. EEG to evaluate for seizure   Level of alertness: Awake  AEDs during EEG study: None  Technical aspects: This EEG study was done with scalp electrodes positioned according to the 10-20 International system of electrode placement. Electrical activity was acquired at a sampling rate of 500Hz  and reviewed with a high frequency filter of 70Hz  and a low frequency filter of 1Hz . EEG data were recorded continuously and digitally stored.   Description: The posterior dominant rhythm consists of 9 Hz activity of moderate voltage (25-35 uV) seen predominantly in posterior head regions, symmetric and reactive to eye opening and eye closing. EEG showed intermittent 2-3hz  delta slowing in right temporal region.  Hyperventilation and photic stimulation were not performed.   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 No seizures or epileptiform discharges were seen throughout the recording.  [name] Barbra Sarks

## 2020-05-03 NOTE — ED Provider Notes (Signed)
[name] DEPART[facility] Provider Note   CSN: [id number] Arrival date & time: 05/03/20  1338     History No chief complaint on file.   Dustin Hamilton is a 79 y.o. male.  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 Gwenlyn Found presents with concern for syncope. Reports having normal morning, remember eating breakfast and then doesn't remember what happened. No chest pain or dyspnea. No vomiting.  No black or bloody stools. No numbness/weakness.  No fever/cough/urinary symptoms.  Per wife, he had finished breakfast and was sitting in recliner when she saw him slump and phone fall out of his hands> Initially thought he fell asleep but then heard abnormal breathing sound and attempted to wake him and was unable to. Called EMS who instructed her to move him to the floor and check breathing. Reports getting him off the chair to the floor and after he was on the floor he woke up.  Reports it likely lasted 5 minutes.   Reports episode happened 1 hour after they removed his ziopatch and that he had no syncopal episodes in the last 2 weeks Has had similar episodes of syncope without warning [facility] and [facility]   Doesn't remember them putting him on the floor Woke up surrounded by fire department, being put on stretcher and brought in ambulance  Past Medical History:  Diagnosis Date  . BPH (benign prostatic hyperplasia)   . Cerebrovascular disease   . Congenital anomaly of diaphragm   . Elevated PSA   . Glaucoma, both eyes   . Hemorrhoid   . Hepatitis B surface antigen positive    02-20-2011  . History of adenomatous polyp of colon    2007, 2009 and 2013  tubular adenoma's  . History of alcohol abuse    quit 1963  . History of cerebral parenchymal hemorrhage    01/ 2006  left occiptial lobe related to hypertensive crisis  . History  of CVA (cerebrovascular accident)    09-12-2012  left hippocampus/ amygdala junction and per MRI old white matter infarcts--  per pt residual short- term memory issues  . History of fatty infiltration of liver hx visit's at [facility] , last visit 05/ 2014   elvated LFT's ,  via liver bx 2004 related to hx alcohol and drug abuse (quit 1964)  . 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 HTN (hypertension)   . Renal cyst, left   . Stroke ([place])    hx of 3 strokes in past   . 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 Urethral lesion    urethral mass    Patient Active Problem List   Diagnosis Date Noted  . Syncope 05/03/2020  . History of stroke 05/03/2020  . History of supraventricular tachycardia 05/03/2020  . Thrombocytopenia ([place]) 05/03/2020  . Acute kidney injury ([place]) 05/03/2020  . Memory impairment 05/03/2020  . Atypical chest pain 01/27/2020  . SVT (supraventricular tachycardia) ([place]) 06/03/2017  . Dizziness 08/02/2014  . 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 CVA (cerebral infarction) 09/13/2012  . Vertigo 09/12/2012  . EAR PAIN 09/05/2009  . BACK PAIN 06/20/2009  . COLONIC POLYPS 05/06/2007  . 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 NONDEPENDENT ALCOHOL ABUSE IN REMISSION 05/06/2007  . HYPERTENSION, SEVERE 05/06/2007  .  Hypertensive heart disease without heart failure 05/06/2007  . INTRACRANIAL HEMORRHAGE 05/06/2007  . Cerebrovascular disease 05/06/2007  . HEMORRHOIDS 05/06/2007  . INGUINAL HERNIA, RIGHT 05/06/2007  . RENAL CYST 05/06/2007  . HEMATURIA UNSPECIFIED 05/06/2007  . 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 PSA, INCREASED 05/06/2007  . LIVER FUNCTION TESTS, ABNORMAL 05/06/2007  . BPH (benign prostatic hypertrophy) with urinary obstruction 05/06/2007    Past Surgical History:  Procedure Laterality Date  . CARDIOVASCULAR STRESS TEST  05/05/2007   normal nuclear study w/ no  ischemia/  normal LV fucntion and wall motion , ef60%  . COLONOSCOPY  last one 04-06-2012  . CYSTO/  LEFT RETROGRADE PYELOGRAM/ CYTOLOGY WASHINGS/  URETEROSCOPY  03/05/2000  . INGUINAL HERNIA REPAIR Bilateral 1965 and 1980's  . LAPAROSCOPIC INGUINAL HERNIA WITH UMBILICAL HERNIA Right 14/48/1856  . LIVER BIOPSY  1980's and 2004  . SVT ABLATION N/A 11/15/2018   Procedure: SVT ABLATION;  Surgeon: Evans Lance, MD;  Location: [facility];  Service: Cardiovascular;  Laterality: N/A;  . TRANSTHORACIC ECHOCARDIOGRAM  09/13/2012   moderate LVH,  ef 60-65%/    . TRANSURETHRAL RESECTION OF BLADDER TUMOR N/A 08/11/2016   Procedure: TRANSURETHRAL RESECTION OF BLADDER TUMOR (TURBT);  Surgeon: Cleon Gustin, MD;  Location: [facility];  Service: Urology;  Laterality: N/A;       Family History  Problem Relation Age of Onset  . Rheum arthritis Mother   . Diabetes Mother   . Stroke Mother   . Heart attack Mother   . Kidney failure Mother   . Heart attack Father   . Heart disease Maternal Grandmother   . Rheum arthritis Maternal Grandmother   . Diabetes Maternal Grandmother   . Stroke Maternal Grandmother   . Colon cancer Neg Hx     Social History   Tobacco Use  . Smoking status: Former Smoker    Packs/day: 1.00    Years: 5.00    Pack years: 5.00    Types: Cigarettes    Quit date: 11/30/1981    Years since quitting: 38.4  . Smokeless tobacco: Never Used  Vaping Use  . Vaping Use: Never used  Substance Use Topics  . Alcohol use: No    Alcohol/week: 0.0 standard drinks    Comment: hx abuse -- quit:  1963  . Drug use: No    Comment: hx abuse -- quit 1964 (iv heroin and cocaine    Home Medications Prior to Admission medications   Medication Sig Start Date End Date Taking? Authorizing Provider  amLODipine (NORVASC) 5 MG tablet Take 5 mg by mouth daily.   Yes [provider]  aspirin EC 81 MG tablet Take 81 mg by mouth daily.   Yes [provider]  brimonidine (ALPHAGAN) 0.2 % ophthalmic solution Place 1 drop into both eyes 2 (two) times daily.   Yes [provider]  clopidogrel (PLAVIX) 75 MG tablet Take 1 tablet (75 mg total) by mouth daily with breakfast. 05/06/13  Yes Noralee Space, MD  dutasteride (AVODART) 0.5 MG capsule Take 0.5 mg by mouth daily.   Yes [provider]  latanoprost (XALATAN) 0.005 % ophthalmic solution Place 1 drop into both eyes at bedtime.    Yes [provider]  meclizine (ANTIVERT) 25 MG tablet Take 25 mg by mouth 3 (three) times daily as needed for dizziness.   Yes [provider]  memantine (NAMENDA) 5 MG tablet Take 17.5 mg by mouth at bedtime.  Yes [provider]  tamsulosin (FLOMAX) 0.4 MG CAPS capsule Take 0.4 mg by mouth at bedtime.    Yes [provider]  cetirizine (ZYRTEC) 5 MG chewable tablet Chew 1 tablet (5 mg total) by mouth daily. Patient not taking: Reported on 05/03/2020 06/04/18   Stacey Drain [place], PA-C    Allergies    Penicillins  Review of Systems   Review of Systems  Constitutional: Negative for fever.  HENT: Negative for sore throat.   Eyes: Negative for visual disturbance.  Respiratory: Negative for cough and shortness of breath.   Cardiovascular: Negative for chest pain.  Gastrointestinal: Negative for abdominal pain, blood in stool, diarrhea, nausea and vomiting.  Genitourinary: Negative for difficulty urinating and dysuria.  Musculoskeletal: Negative for back pain and neck stiffness.  Skin: Negative for rash.  Neurological: Positive for syncope. Negative for weakness, numbness and headaches.    Physical Exam Updated Vital Signs BP (!) 164/114   Pulse 64   Temp 97.7 F (36.5 C) (Oral)   Resp 14   SpO2 99%   Physical Exam Vitals and nursing note reviewed.  Constitutional:      General: He is not in acute distress.    Appearance: He is well-developed. He is not diaphoretic.  HENT:     Head:  Normocephalic and atraumatic.  Eyes:     Conjunctiva/sclera: Conjunctivae normal.  Cardiovascular:     Rate and Rhythm: Normal rate and regular rhythm.     Heart sounds: Normal heart sounds. No murmur heard.  No friction rub. No gallop.   Pulmonary:     Effort: Pulmonary effort is normal. No respiratory distress.     Breath sounds: Normal breath sounds. No wheezing or rales.  Abdominal:     General: There is no distension.     Palpations: Abdomen is soft.     Tenderness: There is no abdominal tenderness. There is no guarding.  Musculoskeletal:     Cervical back: Normal range of motion.  Skin:    General: Skin is warm and dry.  Neurological:     Mental Status: He is alert and oriented to person, place, and time.     ED Results / Procedures / Treatments   Labs (all labs ordered are listed, but only abnormal results are displayed) Labs Reviewed  CBC WITH DIFFERENTIAL/PLATELET - Abnormal; Notable for the following components:      Result Value   Platelets 136 (*)    All other components within normal limits  COMPREHENSIVE METABOLIC PANEL - Abnormal; Notable for the following components:   Potassium 3.4 (*)    Glucose, Bld 133 (*)    Creatinine, Ser 1.38 (*)    Albumin 3.3 (*)    GFR, Estimated 52 (*)    All other components within normal limits  URINALYSIS, ROUTINE W REFLEX MICROSCOPIC - Abnormal; Notable for the following components:   APPearance HAZY (*)    Protein, ur 30 (*)    Leukocytes,Ua MODERATE (*)    Bacteria, UA RARE (*)    All other components within normal limits  RESP PANEL BY RT-PCR (FLU A&B, COVID) ARPGX2  TSH  BASIC METABOLIC PANEL  CBC  TROPONIN I (HIGH SENSITIVITY)  TROPONIN I (HIGH SENSITIVITY)    EKG EKG Interpretation  Date/Time:  Thursday May 03 2020 13:39:07 EST Ventricular Rate:  56 PR Interval:    QRS Duration: 129 QT Interval:  415 QTC Calculation: 401 R Axis:   -64 Text Interpretation: Sinus rhythm Prolonged PR interval IVCD,  consider atypical RBBB Anterior infarct, old No significant change since last tracing Confirmed by Gareth Morgan [phone]) on 05/03/2020 2:00:48 PM   Radiology MR BRAIN WO CONTRAST  Result Date: 05/03/2020 CLINICAL DATA:  Recurrent syncope EXAM: MRI HEAD WITHOUT CONTRAST TECHNIQUE: Multiplanar, multiecho pulse sequences of the brain and surrounding structures were obtained without intravenous contrast. COMPARISON:  MRI head 09/24/2018 FINDINGS: Brain: Negative for acute infarct. Moderate chronic microvascular ischemic change in the white matter bilaterally. Mild chronic ischemic change in the pons. Numerous foci of punctate microhemorrhage throughout both cerebral hemispheres and in the cerebellum bilaterally. No change from the prior study. No large area of hemorrhage. No mass or edema identified. Vascular: Normal arterial flow voids. Skull and upper cervical spine: No focal skeletal abnormality Sinuses/Orbits: Mild mucosal edema paranasal sinuses. Left cataract extraction Other: None IMPRESSION: Negative for acute infarct Atrophy and moderate chronic ischemic change. Numerous foci of chronic microhemorrhage throughout the cerebellum and cerebrum bilaterally. This is unchanged from the prior study. Differential includes uncontrolled hypertension and cerebral amyloid. Electronically Signed   By: Franchot Gallo M.D.   On: 05/03/2020 21:56   DG Chest Portable 1 View  Result Date: 05/03/2020 CLINICAL DATA:  Syncope EXAM: PORTABLE CHEST 1 VIEW COMPARISON:  12/04/2019 FINDINGS: Persistent elevation of the right hemidiaphragm. No new consolidation or edema. No significant pleural effusion. No pneumothorax. Stable cardiomediastinal contours with normal heart size. IMPRESSION: No acute process in the chest. Electronically Signed   By: Macy Mis M.D.   On: 05/03/2020 14:28   EEG adult  Result Date: 05/03/2020 Lora Havens, MD     05/03/2020  7:19 PM Patient Name: Dustin Hamilton MRN: [id number]  Epilepsy Attending: Lora Havens Referring Physician/Provider: Clance Boll, NP Date: 05/03/2020 Duration: 23.12 mins Patient history: 79 yo male with a PMHx of CVA 2014, recurrent syncope, AVB, SVT s/p ablation with "staring spells" and syncope. EEG to evaluate for seizure Level of alertness: Awake AEDs during EEG study: None Technical aspects: This EEG study was done with scalp electrodes positioned according to the 10-20 International system of electrode placement. Electrical activity was acquired at a sampling rate of 500Hz  and reviewed with a high frequency filter of 70Hz  and a low frequency filter of 1Hz . EEG data were recorded continuously and digitally stored. Description: The posterior dominant rhythm consists of 9 Hz activity of moderate voltage (25-35 uV) seen predominantly in posterior head regions, symmetric and reactive to eye opening and eye closing. EEG showed intermittent 2-3hz  delta slowing in right temporal region.  Hyperventilation and photic stimulation were not performed.  ABNORMALITY -Intermittent slow, right temporal region IMPRESSION: This study is suggestive of non-specific cortical dysfuntion in right temporal region. No seizures or epileptiform discharges were seen throughout the recording. Lora Havens    Procedures Procedures (including critical care time)  Medications Ordered in ED Medications  enoxaparin (LOVENOX) injection 40 mg (40 mg Subcutaneous Given 05/03/20 1920)  aspirin EC tablet 81 mg (has no administration in time range)  amLODipine (NORVASC) tablet 5 mg (5 mg Oral Given 05/03/20 1919)  memantine (NAMENDA) tablet 17.5 mg (17.5 mg Oral Given 05/03/20 2238)  dutasteride (AVODART) capsule 0.5 mg (has no administration in time range)  tamsulosin (FLOMAX) capsule 0.4 mg (0.4 mg Oral Given 05/03/20 2238)  clopidogrel (PLAVIX) tablet 75 mg (has no administration in time range)  brimonidine (ALPHAGAN) 0.2 % ophthalmic solution 1 drop (1 drop Both Eyes Not  Given 05/03/20 2213)  latanoprost (XALATAN) 0.005 % ophthalmic solution 1 drop (  1 drop Both Eyes Not Given 05/03/20 2213)  sodium chloride flush (NS) 0.9 % injection 3 mL (has no administration in time range)  acetaminophen (TYLENOL) tablet 650 mg (has no administration in time range)    Or  acetaminophen (TYLENOL) suppository 650 mg (has no administration in time range)  polyethylene glycol (MIRALAX / GLYCOLAX) packet 17 g (has no administration in time range)    ED Course  I have reviewed the triage vital signs and the nursing notes.  Pertinent labs & imaging results that were available during my care of the patient were reviewed by me and considered in my medical decision making (see chart for details).    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 Gwenlyn Found presents with concern for syncope.  Episode occurred one hour after they removed his ziopatch.  No significant lab abnormalities, or hx to suggest PE/ACS/dissection/AAA/sepsis.   Discussed with Dr. Radford Pax of Cardiology. Recommends medicine admission, Neurology consult (pt had previously seen Dr. For staring/zoning out spells), ECHO, and if no etiology seen Cardiology consult with consideration for loop recorder placement.     Final Clinical Impression(s) / ED Diagnoses Final diagnoses:  Syncope, unspecified syncope type    Rx / DC Orders ED Discharge Orders    None       Gareth Morgan, MD 05/03/20 2239

## 2020-05-03 NOTE — Progress Notes (Signed)
EEG complete - results pending 

## 2020-05-04 ENCOUNTER — Observation Stay (HOSPITAL_BASED_OUTPATIENT_CLINIC_OR_DEPARTMENT_OTHER): Payer: Medicare PPO

## 2020-05-04 DIAGNOSIS — I4892 Unspecified atrial flutter: Secondary | ICD-10-CM | POA: Diagnosis not present

## 2020-05-04 DIAGNOSIS — N179 Acute kidney failure, unspecified: Secondary | ICD-10-CM | POA: Diagnosis not present

## 2020-05-04 DIAGNOSIS — R55 Syncope and collapse: Secondary | ICD-10-CM

## 2020-05-04 DIAGNOSIS — I441 Atrioventricular block, second degree: Secondary | ICD-10-CM | POA: Diagnosis not present

## 2020-05-04 DIAGNOSIS — Z8679 Personal history of other diseases of the circulatory system: Secondary | ICD-10-CM | POA: Diagnosis not present

## 2020-05-04 DIAGNOSIS — I472 Ventricular tachycardia: Secondary | ICD-10-CM | POA: Diagnosis not present

## 2020-05-04 DIAGNOSIS — Z20822 Contact with and (suspected) exposure to covid-19: Secondary | ICD-10-CM | POA: Diagnosis not present

## 2020-05-04 DIAGNOSIS — Z8673 Personal history of transient ischemic attack (TIA), and cerebral infarction without residual deficits: Secondary | ICD-10-CM | POA: Diagnosis not present

## 2020-05-04 DIAGNOSIS — R413 Other amnesia: Secondary | ICD-10-CM | POA: Diagnosis not present

## 2020-05-04 DIAGNOSIS — D696 Thrombocytopenia, unspecified: Secondary | ICD-10-CM | POA: Diagnosis not present

## 2020-05-04 DIAGNOSIS — I119 Hypertensive heart disease without heart failure: Secondary | ICD-10-CM | POA: Diagnosis not present

## 2020-05-04 DIAGNOSIS — H409 Unspecified glaucoma: Secondary | ICD-10-CM | POA: Diagnosis not present

## 2020-05-04 DIAGNOSIS — N401 Enlarged prostate with lower urinary tract symptoms: Secondary | ICD-10-CM | POA: Diagnosis not present

## 2020-05-04 DIAGNOSIS — I639 Cerebral infarction, unspecified: Secondary | ICD-10-CM | POA: Diagnosis not present

## 2020-05-04 DIAGNOSIS — R972 Elevated prostate specific antigen [PSA]: Secondary | ICD-10-CM | POA: Diagnosis not present

## 2020-05-04 LAB — BASIC METABOLIC PANEL
Anion gap: 11 (ref 5–15)
BUN: 14 mg/dL (ref 8–23)
CO2: 22 mmol/L (ref 22–32)
Calcium: 9.6 mg/dL (ref 8.9–10.3)
Chloride: 106 mmol/L (ref 98–111)
Creatinine, Ser: 0.88 mg/dL (ref 0.61–1.24)
GFR, Estimated: >60 mL/min (ref >60)
Glucose, Bld: 95 mg/dL (ref 70–99)
Potassium: 3.5 mmol/L (ref 3.5–5.1)
Sodium: 139 mmol/L (ref 135–145)

## 2020-05-04 LAB — GLUCOSE, CAPILLARY
Glucose-Capillary: 154 mg/dL — ABNORMAL HIGH (ref 70–99)
Glucose-Capillary: 135 mg/dL — ABNORMAL HIGH (ref 70–99)

## 2020-05-04 LAB — ECHOCARDIOGRAM COMPLETE
Area-P 1/2: 7.99 cm2
Height: 71 in
Weight: 2631.41 oz

## 2020-05-04 LAB — CBC
HCT: 46.4 % (ref 39.0–52.0)
Hemoglobin: 16.1 g/dL (ref 13.0–17.0)
MCH: 32.1 pg (ref 26.0–34.0)
MCHC: 34.7 g/dL (ref 30.0–36.0)
MCV: 92.4 fL (ref 80.0–100.0)
Platelets: 153 10*3/uL (ref 150–400)
RBC: 5.02 MIL/uL (ref 4.22–5.81)
RDW: 13.9 % (ref 11.5–15.5)
WBC: 6.3 10*3/uL (ref 4.0–10.5)
nRBC: 0 % (ref 0.0–0.2)

## 2020-05-04 LAB — MAGNESIUM: Magnesium: 1.9 mg/dL (ref 1.7–2.4)

## 2020-05-04 LAB — TSH: TSH: 1.05 u[IU]/mL (ref 0.350–4.500)

## 2020-05-04 LAB — TROPONIN I (HIGH SENSITIVITY): Troponin I (High Sensitivity): 16 ng/L (ref <18)

## 2020-05-04 MED ORDER — PERFLUTREN LIPID MICROSPHERE
1.0000 mL | INTRAVENOUS | Status: AC | PRN
  Administered 2020-05-04: 2 mL via INTRAVENOUS
  Filled 2020-05-04: qty 10

## 2020-05-04 MED ORDER — POTASSIUM CHLORIDE CRYS ER 20 MEQ PO TBCR
40.0000 meq | EXTENDED_RELEASE_TABLET | Freq: Four times a day (QID) | ORAL | Status: AC
  Administered 2020-05-04 (×2): 40 meq via ORAL
  Filled 2020-05-04 (×2): qty 2

## 2020-05-04 MED ORDER — MAGNESIUM SULFATE 2 GM/50ML IV SOLN
2.0000 g | Freq: Once | INTRAVENOUS | Status: AC
  Administered 2020-05-04: 2 g via INTRAVENOUS
  Filled 2020-05-04: qty 50

## 2020-05-04 NOTE — Evaluation (Signed)
Occupational Therapy Evaluation Patient Details Name: Dustin Hamilton MRN: [id number] DOB: 1940/11/25 Today's Date: 05/04/2020    History of Present Illness 79 y.o. male presenting with syncope after removal of Zio patch believed to be cardiogenic. 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 PMHx significant for CVA, HTN, pSVT with AVNRT ablation in 11/2018, Mobitz Type 1 AV block and dizziness.    Clinical Impression   PTA patient was living in a private residence with his wife and was Mod I with ADLs with use of AD/DME. Patient reports share responsibility for IADLs with his wife including cooking. Patient's wife was responsible for transportation. Patient currently presents with deficits in balance making ADLs including toileting, LB dressing, and grooming at sink level a hardship. Patient would benefit from continued acute OT services to maximize independence with self-care tasks, ADL transfers, and household/community mobility. Recommendation for return home with 24hr supervision/assist from wife. No follow-up OT recommendations post d/c at this time.     Follow Up Recommendations  No OT follow up;Supervision/Assistance - 24 hour    Equipment Recommendations  None recommended by OT (Patient has necessary DME. )    Recommendations for Other Services       Precautions / Restrictions Precautions Precautions: Fall Precaution Comments: syncope bouts, orthostatic hypertension Restrictions Weight Bearing Restrictions: No      Mobility Bed Mobility Overal bed mobility: Independent             General bed mobility comments: Pt able to transition supine to sit independently with bed flat and no use of bed rails to simulate home.    Transfers Overall transfer level: Needs assistance Equipment used: Rolling walker (2 wheeled) Transfers: Sit to/from Stand Sit to Stand: Min guard         General transfer comment: Min guard for safety to power up to stand, mild trunk sway noted  but no LOB.    Balance Overall balance assessment: Needs assistance Sitting-balance support: No upper extremity supported;Feet supported Sitting balance-Leahy Scale: Good Sitting balance - Comments: Static sitting EOB with supervision for safety.   Standing balance support: Bilateral upper extremity supported;No upper extremity supported Standing balance-Leahy Scale: Fair Standing balance comment: Static standing no LOB with bilat UE support on RW, but unable to hold Rhomberg stance > 3 sec without UE support or LOB.         Rhomberg - Eyes Opened: 3 (seconds no UE support before LOB)       Standardized Balance Assessment Standardized Balance Assessment : TUG: Timed Up and Go Test     Timed Up and Go Test TUG: Normal TUG Normal TUG (seconds): 12.5 (average of 2 trials (15 sec > 10 sec))   ADL either performed or assessed with clinical judgement   ADL Overall ADL's : Needs assistance/impaired     Grooming: Wash/dry hands;Min guard;Standing Grooming Details (indicate cue type and reason): Min gaurd for balance.          Upper Body Dressing : Set up;Sitting   Lower Body Dressing: Min guard;Sit to/from stand Lower Body Dressing Details (indicate cue type and reason): Patient able to doff/don footwear seated in recliner without assist and use of figure-4 position.  Toilet Transfer: Magazine features editor Details (indicate cue type and reason): Min guard and 1 person HHA for balance.          Functional mobility during ADLs: Min guard;Minimal assistance General ADL Comments: Min guard with 1 person HHA and Min A to correct lateral LOB  during short distance functional mobility in room.      Vision Baseline Vision/History: Wears glasses Wears Glasses: At all times Patient Visual Report: Other (comment) (Glaucoma and cataracts) Vision Assessment?: No apparent visual deficits     Perception     Praxis      Pertinent Vitals/Pain Pain Assessment: No/denies pain      Hand Dominance  (both)   Extremity/Trunk Assessment Upper Extremity Assessment Upper Extremity Assessment: Overall WFL for tasks assessed   Lower Extremity Assessment Lower Extremity Assessment: Defer to PT evaluation   Cervical / Trunk Assessment Cervical / Trunk Assessment: Normal   Communication Communication Communication: No difficulties   Cognition Arousal/Alertness: Awake/alert Behavior During Therapy: WFL for tasks assessed/performed Overall Cognitive Status: No family/caregiver present to determine baseline cognitive functioning                                 General Comments: A&Ox4. Pt slow to process and provide some info on home set-up, requiring cues to find correct words.   General Comments  No c/o dizziness/lightheadedness throughout eval.     Exercises     Shoulder Instructions      Home Living Family/patient expects to be discharged to:: Private residence Living Arrangements: Spouse/significant other Available Help at Discharge: Family;Available 24 hours/day (wife) Type of Home: House Home Access: Stairs to enter;Ramped entrance Entrance Stairs-Number of Steps: 2 (but uses ramp entrance)   Home Layout: Two level;Full bath on main level;Bed/bath upstairs;Able to live on main level with bedroom/bathroom (basement has second set of bath/bedroom; uses first level) Alternate Level Stairs-Number of Steps: flight (but uses elevator)   Bathroom Shower/Tub: Walk-in shower;Door   [employer] Toilet: Standard Bathroom Accessibility: Yes   Home Equipment: Clinical cytogeneticist - 2 wheels;Walker - 4 wheels;Walker - standard;Bedside commode;Grab bars - toilet;Grab bars - tub/shower;Hand held shower head;Electric scooter;Wheelchair - manual          Prior Functioning/Environment Level of Independence: Needs assistance  Gait / Transfers Assistance Needed: Walks with supervision and rollator. Uses scooter and rollator to enter/exit home on  ramp. ADL's / Homemaking Assistance Needed: Independent with grooming and dressing. Assistance for cooking.   Comments: Wife drives pt to appointments. [facility] to come to house 3-4x/week for ~3 hours to do house chores.        OT Problem List: Impaired balance (sitting and/or standing);Decreased safety awareness;Decreased knowledge of use of DME or AE      OT Treatment/Interventions: Self-care/ADL training;Therapeutic exercise;Energy conservation;DME and/or AE instruction;Therapeutic activities;Patient/family education;Balance training    OT Goals(Current goals can be found in the care plan section) Acute Rehab OT Goals Patient Stated Goal: To return home.  OT Goal Formulation: With patient Time For Goal Achievement: 05/18/20 Potential to Achieve Goals: Good ADL Goals Additional ADL Goal #1: Patient will complete 3/3 grooming tasks standing at sink level with unilateral UE support on sink surface and no LOB. Additional ADL Goal #2: Patient will complete 3/3 parts of toileting task with Mod I and no LOB.  OT Frequency: Min 2X/week   Barriers to D/C:            Co-evaluation              AM-PAC OT "6 Clicks" Daily Activity     Outcome Measure Help from another person eating meals?: None Help from another person taking care of personal grooming?: A Little Help from another person toileting, which  includes using toliet, bedpan, or urinal?: A Little Help from another person bathing (including washing, rinsing, drying)?: A Little Help from another person to put on and taking off regular upper body clothing?: None Help from another person to put on and taking off regular lower body clothing?: A Little 6 Click Score: 20   End of Session Equipment Utilized During Treatment: Gait belt  Activity Tolerance: Patient tolerated treatment well Patient left: in bed;with call bell/phone within reach;with bed alarm set  OT Visit Diagnosis: Unsteadiness on feet  (R26.81);Other abnormalities of gait and mobility (R26.89)                Time: 2091-0681 OT Time Calculation (min): 24 min Charges:  OT General Charges $OT Visit: 1 Visit OT Evaluation $OT Eval Low Complexity: 1 Low OT Treatments $Self Care/Home Management : 8-22 mins  Malerie Eakins H. OTR/L Supplemental OT, Department of rehab services [phone]  [place] Basham R H. 05/04/2020, 11:40 AM

## 2020-05-04 NOTE — Progress Notes (Addendum)
FPTS Interim Progress Note  S: Paged by nurse who reported that patient's heart rate was fluctuating from 128 or 178 continuously.  Went to bedside and patient is awake in bed.  Patient initially seemed a little confused and said that he was walking around the house when his fast heart rate occured.  Nurse reports that patient has been sitting in bed.  RN notes that he was lying comfortably awake when alerted of tachycardia on telemetry and has not been out of the bed at all.   O: BP (!) 148/92 (BP Location: Right Arm)   Pulse 64   Temp 97.9 F (36.6 C) (Oral)   Resp 18   Ht 5\' 11"  (1.803 m)   Wt 74.6 kg   SpO2 96%   BMI 22.94 kg/m   Well-appearing male, no acute distress, lying in bed comfortably.  Alert and oriented x4. RRR. lungs CTAB.   Pt has IV access in right arm.  At bedside, RN Murray Hodgkins obtaining 12 lead EKG and patient in SR with HR 58-60's.   A/P: 79 year old male with hx of SVT s/p ablation with multiple episodes of irregular heart rate captured on telemetry. Notably, sustained polymorphic ventricular tachycardia at 0502 for over a minute and atrial flutter at 0512.  Per nurse, patient comfortable at bedside with no alterations in mental status or other signs of hemodynamic instability when patient in these rhythms. On my interview, patient denies any dizziness, shortness of breath, chest pain. Patient has been continued on home amlodipine 5 mg, asa and plavix.No other current cardiac medications.  Paged overnight cardiology fellow for help in further management.  Labs obtained at 0300 show no electrolyte abnormalities. Will send for Mg, troponins.    Wilber Oliphant, MD 05/04/2020, 5:38 AM PGY-3, [facility] pager [phone]

## 2020-05-04 NOTE — Progress Notes (Signed)
Family Medicine Teaching Service Daily Progress Note Intern Pager: [phone]  Patient name: Dustin Hamilton Medical record number: [id number] Date of birth: [birth year] Age: 79 y.o. Gender: male  Primary Care Provider: [facility] Consultants: Neurology Code Status: Full  Pt Overview and Major Events to Date:  12/2 Admitted 12/3 Episode of polymorphic VT and atrial flutter  Assessment and Plan: Dustin Hamilton is a 79 y.o. male presenting s/p witnessed syncopal episode . PMH is significant for CVA, HTN, pSVT with AVNRT ablation in 11/2018, Mobitz Type 1 AV block, dizziness.   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  Patient had a Zio patch from 11/12-12/2. Overnight patient had multiple episodes of irregular heart rate on telemetry. Sustained polymorphic ventricular tachycardia at 0502 for over 1 minute and atrial flutter at 0512.Patient was noted to have 2 episodes of what appears to be VT around 7:45 AM and 8:13 AM.  Martin Majestic to see patient with normal neuro exam regular rate by the time he was examined. Troponin at 0626 was 16, mag level of 1.9, TSH 1.050. - Patient changed from tele to progressive. - Cardiology consulted, will appreciate recs - Continue cardiac monitoring - F/u echocardiogram - F/u repeat EKG  Syncope, acute, resolved Witnessed syncopal episode on 12/2 with history of similar episodes and chronic dizziness. 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 EKG without ST changes and negative troponins. Baseline neuro exam, neurology consulted in ED.  MRI negative for acute infarct.EEG showed no seizures or epileptiform discharges.  Cardiology consulted as patient had several arrhythmias overnight including VT and atrial flutter.  Likely cardiogenic in nature  due to history and recurrent arrhythmias while monitored in the hospital.  - Cardiology consulted, appreciate recs - Neurology following, appreciate recs - Cardiac monitoring - Echocardiogram - F/u orthostatic vitals - F/u Mag  - F/u Troponins  HTN: Chronic, stable Home meds include Amlodipine 5mg  with recommendation to increase to 10mg  if Bps>160 at home. BP overnight ranged between 140s-160s/90s-100s. - Cardiology consulted, will appreciate further recs. - Continue Amlodipine 5mg  daily - Vitals per floor routine  BPH: chronic, stable History of elevated PSA in charts intermittent pain was 5.8.  Patient will need outpatient follow-up. - Continue Dutasteride 0.5mg  daily - Continue Tamusolin 0.4mg  daily  History of CVA in 2014 Neuro exam unremarkable. -PT/OT eval and treat -Continue Plavix 80 mg daily breakfast -Continue ASA 81 mg daily  Glaucoma: Chronic, stable Left we will nonreactive with decreased left visual field (can see the hesitate) - Continue Brimonidine 0.2% ophthalmic solution 1 drop both eyes BID - Continue Lantanoprost 0.005% ophthalmic solution, 1 drop both eyes QHS  Memory impairment: Chronic, stable A&O x4. Currently followed by neurology. - Continue home Namenda 17.5mg  QHS  Elevated creatinine: resolved Cr 1.38 on admission, currently 0.88. Baseline appears to be around 1.1.  - Continue to monitor  FEN/GI: Regular diet PPx: Lovenox   Status is: Observation  The patient remains OBS appropriate and will d/c before 2 midnights.  Dispo:  Patient From: Home  Planned Disposition: To be determined  Expected discharge date: 05/05/20  Medically stable for discharge: No    Subjective:  Patient reports that he is feeling fine he does not ever feel these episodes of ventricular tachycardia.  Denies any chest pain, shortness of breath, headache, vision changes, weakness.  Patient states he was feeling really good and was on his phone while in the  room.  Objective: Temp:  [97.7 F (36.5 C)-97.9 F (36.6 C)] 97.9 F (36.6 C) (12/03 0335) Pulse Rate:  [57-72] 64 (12/03 0335) Resp:  [14-21] 18 (12/03 0335) BP: (109-164)/(68-114) 148/92 (12/03 0335) SpO2:  [94 %-99 %] 96 % (12/03 0335) Weight:  [74.6 kg] 74.6 kg (12/03 0335) Physical Exam: General: NAD, sitting up in bed, using his phone Cardiovascular: RRR at 12 exam, no M/R/G appreciated Respiratory: CTAB Abdomen: Soft, nontender, nondistended Neuro: - CN II: Pupils asymmetric with decreased vision in left eye - CN III, IV, VI: EOMI - CV V: Normal sensation V1, V2, V3 - CN VII, symmetric smile and eyebrow raise - CN VIII: No hearing deficit noted - CN IX, X: symmetric palate rise - CN XI: 5/5 shoulder shrug - CN XII: symmetric tongue protrusion - UE and LE 5/5 strength with normal sensation  Laboratory: Recent Labs  Lab 05/03/20 1400 05/04/20 0314  WBC 5.2 6.3  HGB 15.1 16.1  HCT 46.5 46.4  PLT 136* 153   Recent Labs  Lab 05/03/20 1400 05/04/20 0314  NA 142 139  K 3.4* 3.5  CL 110 106  CO2 22 22  BUN 16 14  CREATININE 1.38* 0.88  CALCIUM 9.6 9.6  PROT 6.7  --   BILITOT 0.7  --   ALKPHOS 44  --   ALT 16  --   AST 19  --   GLUCOSE 133* 95     Imaging/Diagnostic Tests: MR BRAIN WO CONTRAST  Result Date: 05/03/2020 CLINICAL DATA:  Recurrent syncope EXAM: MRI HEAD WITHOUT CONTRAST TECHNIQUE: Multiplanar, multiecho pulse sequences of the brain and surrounding structures were obtained without intravenous contrast. COMPARISON:  MRI head 09/24/2018 FINDINGS: Brain: Negative for acute infarct. Moderate chronic microvascular ischemic change in the white matter bilaterally. Mild chronic ischemic change in the pons. Numerous foci of punctate microhemorrhage throughout both cerebral hemispheres and in the cerebellum bilaterally. No change from the prior study. No large area of hemorrhage. No mass or edema identified. Vascular: Normal arterial flow voids. Skull and  upper cervical spine: No focal skeletal abnormality Sinuses/Orbits: Mild mucosal edema paranasal sinuses. Left cataract extraction Other: None IMPRESSION: Negative for acute infarct Atrophy and moderate chronic ischemic change. Numerous foci of chronic microhemorrhage throughout the cerebellum and cerebrum bilaterally. This is unchanged from the prior study. Differential includes uncontrolled hypertension and cerebral amyloid. Electronically Signed   By: Franchot Gallo M.D.   On: 05/03/2020 21:56   DG Chest Portable 1 View  Result Date: 05/03/2020 CLINICAL DATA:  Syncope EXAM: PORTABLE CHEST 1 VIEW COMPARISON:  12/04/2019 FINDINGS: Persistent elevation of the right hemidiaphragm. No new consolidation or edema. No significant pleural effusion. No pneumothorax. Stable cardiomediastinal contours with normal heart size. IMPRESSION: No acute process in the chest. Electronically Signed   By: Macy Mis M.D.   On: 05/03/2020 14:28   EEG adult  Result Date: 05/03/2020 Lora Havens, MD     05/03/2020  7:19 PM Patient Name: KAYLA WEEKES MRN: [id number] Epilepsy Attending: Lora Havens Referring Physician/Provider: Clance Boll, NP Date: 05/03/2020 Duration: 23.12 mins Patient history: 79 yo male with a PMHx of CVA 2014, recurrent syncope, AVB, SVT s/p ablation with "staring spells" and syncope. EEG to evaluate for seizure Level of alertness: Awake AEDs during EEG study: None Technical aspects: This EEG study was done with scalp electrodes positioned according to the 10-20 International system of  electrode placement. Electrical activity was acquired at a sampling rate of 500Hz  and reviewed with a high frequency filter of 70Hz  and a low frequency filter of 1Hz . EEG data were recorded continuously and digitally stored. Description: The posterior dominant rhythm consists of 9 Hz activity of moderate voltage (25-35 uV) seen predominantly in posterior head regions, symmetric and reactive to eye opening  and eye closing. EEG showed intermittent 2-3hz  delta slowing in right temporal region.  Hyperventilation and photic stimulation were not performed.  ABNORMALITY -Intermittent slow, right temporal region IMPRESSION: This study is suggestive of non-specific cortical dysfuntion in right temporal region. No seizures or epileptiform discharges were seen throughout the recording. Reino Kent, DO 05/04/2020, 5:50 AM PGY-1, [place] Intern pager: [phone], text pages welcome

## 2020-05-04 NOTE — Evaluation (Signed)
Physical Therapy Evaluation Patient Details Name: Dustin Hamilton MRN: [id number] DOB: [birth year] Today's Date: 05/04/2020   History of Present Illness  (P) Pt is a 79 year old male who presented with bouts of syncope which occurred one hour after removal of his ziopatch. He has had similar episodes before with the most recent being in September. EEG reveaked non-specific cortical dysfunction in R temporal region and no seizures or epileptiform discharges were seen during recording. MRI negative for acute infarct but numerous foci of chronic microhemorrhage throughout bilat cerebellum and cerebrum, unchanged from prior study. 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   Clinical Impression  Pt presents with condition mentioned above and deficits mentioned below, see PT Problem List. Pt requires supervision for ambulation with use of a rollator at home but otherwise is independent with AD?AE with functional mobility. His wife is available 24/7 and can drive him to appointments. Pt reports decreased balance compared to his baseline and exhibited 1 LOB resulting in minA to recover. His TUG test time average was 12.5 sec this date, placing him on the edge of being at risk for falls. In addition, when his BOS is narrowed he easily loses balance. Thus, will recommend follow-up with outpatient PT services at d/c to maximize his safety with mobility. Will continue to follow acutely. See General Comments below to see BP changes during session, with it rising with changes in position but no reported signs/symptoms from pt.    Follow Up Recommendations Outpatient PT;Supervision for mobility/OOB    Equipment Recommendations  None recommended by PT    Recommendations for Other Services        Precautions / Restrictions Precautions Precautions: (P) Fall Precaution Comments: (P) syncope bouts, orthostatic hypertension Restrictions Weight Bearing Restrictions: (P) No      Mobility  Bed Mobility Overal bed mobility: (P) Independent             General bed mobility comments: (P) Pt able to transition supine to sit independently with bed flat and no use of bed rails to simulate home.    Transfers Overall transfer level: (P) Needs assistance Equipment used: (P) Rolling walker (2 wheeled) Transfers: (P) Sit to/from Stand Sit to Stand: (P) Min guard         General transfer comment: (P) Min guard for safety to power up to stand, mild trunk sway noted but no LOB.  Ambulation/Gait Ambulation/Gait assistance: Min guard;Min assist Gait Distance (Feet): 40 Feet (x4 bouts ~40 > 20 > 20 > ~30 ft) Assistive device: Rolling walker (2 wheeled) Gait Pattern/deviations: Step-through pattern;Decreased step length - right;Decreased dorsiflexion - right;Decreased dorsiflexion - left Gait velocity: WNL Gait velocity interpretation: 1.31 - 2.62 ft/sec, indicative of limited community ambulator General Gait Details: Ambulates with poor bilat foot clearance with occasional drag and pt demonstrating mid-foot initial contact. Decreased R step length compared to L. Min guard assist majority of time for safety but 1 LOB posteriorly resulting in minA to recover.  Stairs            Wheelchair Mobility    Modified Rankin (Stroke Patients Only) Modified Rankin (Stroke Patients Only) Pre-Morbid Rankin Score: Slight disability Modified Rankin: Slight disability     Balance Overall balance assessment: (P) Needs assistance Sitting-balance support: (P) No upper extremity supported;Feet supported Sitting balance-Leahy Scale: (P)  Good Sitting balance - Comments: (P) Static sitting EOB with supervision for safety.   Standing balance support: (P) Bilateral upper extremity supported;No  upper extremity supported Standing balance-Leahy Scale: (P) Fair Standing balance comment: (P) Static standing no LOB with bilat UE support on RW, but unable to hold Rhomberg stance > 3 sec without UE support or LOB.         Rhomberg - Eyes Opened: (P) 3 (seconds no UE support before LOB)       Standardized Balance Assessment Standardized Balance Assessment : (P) TUG: Timed Up and Go Test     Timed Up and Go Test TUG: (P) Normal TUG Normal TUG (seconds): (P) 12.5 (average of 2 trials (15 sec > 10 sec))     Pertinent Vitals/Pain Pain Assessment: (P) No/denies pain    Home Living Family/patient expects to be discharged to:: (P) Private residence Living Arrangements: (P) Spouse/significant other Available Help at Discharge: (P) Family;Available 24 hours/day (wife) Type of Home: (P) House Home Access: (P) Stairs to enter;Ramped entrance   Entrance Stairs-Number of Steps: (P) 2 (but uses ramp entrance) Home Layout: (P) Two level;Full bath on main level;Bed/bath upstairs;Able to live on main level with bedroom/bathroom (basement has second set of bath/bedroom; uses first level) Home Equipment: (P) Shower seat;Walker - 2 wheels;Walker - 4 wheels;Walker - standard;Bedside commode;Grab bars - toilet;Grab bars - tub/shower;Hand held shower head;Electric scooter;Wheelchair - manual      Prior Function Level of Independence: (P) Needs assistance   Gait / Transfers Assistance Needed: (P) Walks with supervision and rollator. Uses scooter and rollator to enter/exit home on ramp.  ADL's / Homemaking Assistance Needed: (P) Independent with grooming and dressing. Assistance for cooking.  Comments: (P) Wife drives pt to appointments. [facility] provides an aide to come to house 3-4x/week for ~3 hours to do house chores.     Hand Dominance   Dominant Hand: (P)  (both)    Extremity/Trunk Assessment   Upper Extremity Assessment Upper Extremity Assessment: (P) Overall WFL for tasks assessed     Lower Extremity Assessment Lower Extremity Assessment: (P) Defer to PT evaluation    Cervical / Trunk Assessment Cervical / Trunk Assessment: (P) Normal  Communication   Communication: (P) No difficulties  Cognition Arousal/Alertness: (P) Awake/alert Behavior During Therapy: (P) WFL for tasks assessed/performed Overall Cognitive Status: (P) No family/caregiver present to determine baseline cognitive functioning                                 General Comments: (P) A&Ox4. Pt slow to process and provide some info on home set-up, requiring cues to find correct words.      General Comments General comments (skin integrity, edema, etc.): No dizziness, lightheadedness or changes in symptoms/signs reported by pt throughout session; start of session supine BP 130/85, BP 155/97 sitting EOB, BP 150s/107 standing, BP 155/97 after standing several min, BP 152/94 sitting at end of session    Exercises     Assessment/Plan    PT Assessment Patient needs continued PT services  PT Problem List Decreased balance;Decreased mobility;Cardiopulmonary status limiting activity;Decreased cognition       PT Treatment Interventions DME instruction;Gait training;Functional mobility training;Therapeutic activities;Therapeutic exercise;Balance training;Neuromuscular re-education;Cognitive remediation;Patient/family education    PT Goals (Current goals can be found in the Care Plan section)  Acute Rehab PT Goals Patient Stated Goal: to improve his balance PT Goal Formulation: With patient Time For Goal Achievement: 05/18/20  Potential to Achieve Goals: Good    Frequency Min 3X/week   Barriers to discharge        Co-evaluation               AM-PAC PT "6 Clicks" Mobility  Outcome Measure Help needed turning from your back to your side while in a flat bed without using bedrails?: None Help needed moving from lying on your back to sitting on the side of a flat bed without using  bedrails?: None Help needed moving to and from a bed to a chair (including a wheelchair)?: A Little Help needed standing up from a chair using your arms (e.g., wheelchair or bedside chair)?: A Little Help needed to walk in hospital room?: A Little Help needed climbing 3-5 steps with a railing? : A Little 6 Click Score: 20    End of Session Equipment Utilized During Treatment: Gait belt Activity Tolerance: Patient tolerated treatment well Patient left: in chair;with call bell/phone within reach;with chair alarm set Nurse Communication: Mobility status;Precautions;Other (comment) (BP measures) PT Visit Diagnosis: Unsteadiness on feet (R26.81);Other abnormalities of gait and mobility (R26.89)    Time: 0459-9774 PT Time Calculation (min) (ACUTE ONLY): 37 min   Charges:   PT Evaluation $PT Eval Low Complexity: 1 Low PT Treatments $Gait Training: 8-22 mins        Moishe Spice, PT, DPT Acute Rehabilitation Services  Pager: [phone] Office: [phone]   Orvan Falconer 05/04/2020, 10:49 AM

## 2020-05-04 NOTE — Plan of Care (Signed)

## 2020-05-04 NOTE — Care Management Obs Status (Signed)
[place] NOTIFICATION   Patient Details  Name: Dustin Hamilton MRN: [id number] Date of Birth: 07-03-1940   Medicare Observation Status Notification Given:  Yes    Bethena Roys, RN 05/04/2020, 6:54 PM

## 2020-05-04 NOTE — Consult Note (Signed)
Cardiology Consultation:   Patient ID: Dustin Hamilton MRN: [id number]; DOB: [birth year]  Admit date: 05/03/2020 Date of Consult: 05/04/2020  Primary Care Provider: [facility], [facility] Cardiologist: Dustin Burow, MD  [facility] Electrophysiologist:  Dustin Peru, MD    Patient Profile:   Dustin Hamilton is a 79 y.o. male with a hx of prior CVA, HTN, SVT (AVNRT ablated 2020), recurrent syncope/dizzinesswho is being seen today for the evaluation of syncope at the request of Dr. Owens Hamilton.  History of Present Illness:   Dustin Hamilton was admitted after a syncopal spell.  Unfortunately he had just finished wearing a monitor, and removed it just hour or so prior to the event.  LABS K+ 3.4, 3.5 BUN/Creat 16/1.38 >> 14/0.88 HS Trop 9 > 10 WBC 6.3 H/H 16/46 Plts 153 TSH 1.050  Incomplete orthostatic vitals, no standing or 38min vitals   He tells [place] that his fainting and dizziness history goes back over 5 years, he can not tell us how many times he has fainted because it has been so many. He says he has been to the [place] many times and has had many tests done without any diagnosis, says he has worn 3 heart monitors in the recent past.  Notes as well mention prior neuro w/u that have reportedly been unrevealing.  Most of the time he says he is standing, says his wife for some reason asks him how he is feeling and he says bad and she gets the wheelchair.  He says often when he is cooking, though has fainted seated. He is a little hard to follow, seems to get some of his events mixed with others. Though does sound like he has warning prior to fainting. Perhaps his wife seems to recognize a way he looks as well prior to him fainting.  He has been seen of late with ongoing dizzy spells, he wore a monitor 02/02/20 for 2 weeks.  He had a number of patient triggered events with symptoms of lightheaded, dizzy, and at least one reported fainting all with SR, normal HRs He did  have 2 episodes of Mobiz I block with 1 dropped beat each and one short pause though these were early AM, and not associated with symptoms. After this monitor his betablocker was stopped The monitor he just completed was for the purpose of follow up after stopping his BB.  Neurology note is extensive with opportunity to discuss with the patient's wife, and reviewed with Dr. Quentin Hamilton.   Past Medical History:  Diagnosis Date  . BPH (benign prostatic hyperplasia)   . Cerebrovascular disease   . Congenital anomaly of diaphragm   . Elevated PSA   . Glaucoma, both eyes   . Hemorrhoid   . Hepatitis B surface antigen positive    02-20-2011  . History of adenomatous polyp of colon    2007, 2009 and 2013  tubular adenoma's  . History of alcohol abuse    quit 1963  . History of cerebral parenchymal hemorrhage    01/ 2006  left occiptial lobe related to hypertensive crisis  . History of CVA (cerebrovascular accident)    09-12-2012  left hippocampus/ amygdala junction and per MRI old white matter infarcts--  per pt residual short- term memory issues  . History of fatty infiltration of liver hx visit's at [facility] , last visit 05/ 2014   elvated LFT's ,  via liver bx 2004 related to hx alcohol and drug abuse (quit 1964)  .  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 HTN (hypertension)   . Renal cyst, left   . Stroke ([place])    hx of 3 strokes in past   . 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 Urethral lesion    urethral mass    Past Surgical History:  Procedure Laterality Date  . CARDIOVASCULAR STRESS TEST  05/05/2007   normal nuclear study w/ no ischemia/  normal LV fucntion and wall motion , ef60%  . COLONOSCOPY  last one 04-06-2012  . CYSTO/  LEFT RETROGRADE PYELOGRAM/ CYTOLOGY WASHINGS/  URETEROSCOPY  03/05/2000  . INGUINAL HERNIA REPAIR Bilateral 1965 and 1980's  . LAPAROSCOPIC INGUINAL HERNIA WITH UMBILICAL HERNIA  Right 83/41/9622  . LIVER BIOPSY  1980's and 2004  . SVT ABLATION N/A 11/15/2018   Procedure: SVT ABLATION;  Surgeon: Dustin Lance, MD;  Location: [facility];  Service: Cardiovascular;  Laterality: N/A;  . TRANSTHORACIC ECHOCARDIOGRAM  09/13/2012   moderate LVH,  ef 60-65%/    . TRANSURETHRAL RESECTION OF BLADDER TUMOR N/A 08/11/2016   Procedure: TRANSURETHRAL RESECTION OF BLADDER TUMOR (TURBT);  Surgeon: Cleon Gustin, MD;  Location: [facility];  Service: Urology;  Laterality: N/A;     Home Medications:  Prior to Admission medications   Medication Sig Start Date End Date Taking? Authorizing Provider  amLODipine (NORVASC) 5 MG tablet Take 5 mg by mouth daily.   Yes [provider]  aspirin EC 81 MG tablet Take 81 mg by mouth daily.   Yes [provider]  brimonidine (ALPHAGAN) 0.2 % ophthalmic solution Place 1 drop into both eyes 2 (two) times daily.   Yes [provider]  clopidogrel (PLAVIX) 75 MG tablet Take 1 tablet (75 mg total) by mouth daily with breakfast. 05/06/13  Yes Noralee Space, MD  dutasteride (AVODART) 0.5 MG capsule Take 0.5 mg by mouth daily.   Yes [provider]  latanoprost (XALATAN) 0.005 % ophthalmic solution Place 1 drop into both eyes at bedtime.    Yes [provider]  meclizine (ANTIVERT) 25 MG tablet Take 25 mg by mouth 3 (three) times daily as needed for dizziness.   Yes [provider]  memantine (NAMENDA) 5 MG tablet Take 17.5 mg by mouth at bedtime.    Yes [provider]  tamsulosin (FLOMAX) 0.4 MG CAPS capsule Take 0.4 mg by mouth at bedtime.    Yes [provider]  cetirizine (ZYRTEC) 5 MG chewable tablet Chew 1 tablet (5 mg total) by mouth daily. Patient not taking: Reported on 05/03/2020 06/04/18   Lestine Box, PA-C    Inpatient Medications: Scheduled Meds: . amLODipine  5 mg Oral Daily  . aspirin EC  81 mg Oral Daily  . brimonidine  1 drop Both  Eyes BID  . clopidogrel  75 mg Oral Q breakfast  . dutasteride  0.5 mg Oral Daily  . enoxaparin (LOVENOX) injection  40 mg Subcutaneous Q24H  . latanoprost  1 drop Both Eyes QHS  . memantine  17.5 mg Oral QHS  . potassium chloride  40 mEq Oral Q6H  . sodium chloride flush  3 mL Intravenous Q12H  . tamsulosin  0.4 mg Oral QHS   Continuous Infusions:  PRN Meds: acetaminophen **OR** acetaminophen, polyethylene glycol  Allergies:    Allergies  Allergen Reactions  . Penicillins Hives    Social History:   Social History  Socioeconomic History  . Marital status: Married    Spouse name: Dustin Hamilton  . Number of children: 1  . Years of education: College  . Highest education level: Not on file  Occupational History  . Occupation: Geophysicist/field seismologist  Tobacco Use  . Smoking status: Former Smoker    Packs/day: 1.00    Years: 5.00    Pack years: 5.00    Types: Cigarettes    Quit date: 11/30/1981    Years since quitting: 38.4  . Smokeless tobacco: Never Used  Vaping Use  . Vaping Use: Never used  Substance and Sexual Activity  . Alcohol use: No    Alcohol/week: 0.0 standard drinks    Comment: hx abuse -- quit:  1963  . 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 Sexual activity: Not on file  Other Topics Concern  . Not on file  Social History Narrative   Patient lives at home with his spouse.   Caffeine Use:  Tea, lots   Social Determinants of Health   Financial Resource Strain:   . Difficulty of Paying Living Expenses: Not on file  Food Insecurity:   . Worried About Charity fundraiser in the Last Year: Not on file  . Ran Out of Food in the Last Year: Not on file  Transportation Needs:   . Lack of Transportation (Medical): Not on file  . Lack of Transportation (Non-Medical): Not on file  Physical Activity:   . Days of Exercise per Week: Not on file  . Minutes of Exercise per Session: Not on file  Stress:   . Feeling of Stress : Not on file   Social Connections:   . Frequency of Communication with Friends and Family: Not on file  . Frequency of Social Gatherings with Friends and Family: Not on file  . Attends Religious Services: Not on file  . Active Member of Clubs or Organizations: Not on file  . Attends Archivist Meetings: Not on file  . Marital Status: Not on file  Intimate Partner Violence:   . Fear of Current or Ex-Partner: Not on file  . Emotionally Abused: Not on file  . Physically Abused: Not on file  . Sexually Abused: Not on file    Family History:   Family History  Problem Relation Age of Onset  . Rheum arthritis Mother   . Diabetes Mother   . Stroke Mother   . Heart attack Mother   . Kidney failure Mother   . Heart attack Father   . Heart disease Maternal Grandmother   . Rheum arthritis Maternal Grandmother   . Diabetes Maternal Grandmother   . Stroke Maternal Grandmother   . Colon cancer Neg Hx      ROS:  Please see the history of present illness.  All other ROS reviewed and negative.     Physical Exam/Data:   Vitals:   05/04/20 0230 05/04/20 0335 05/04/20 0744 05/04/20 1134  BP: (!) 139/97 (!) 148/92 (!) 141/97 106/73  Pulse: 64 64 80 66  Resp: 17 18 20  (!) 22  Temp:  97.9 F (36.6 C) 98.6 F (37 C) 98.4 F (36.9 C)  TempSrc:  Oral Oral Oral  SpO2: 96% 96% 97% 97%  Weight:  74.6 kg    Height:  5\' 11"  (1.803 m)      Intake/Output Summary (Last 24 hours) at 05/04/2020 1445 Last data filed at 05/04/2020 0900 Gross per 24 hour  Intake 360 ml  Output 300 ml  Net 60 ml   Last 3 Weights 05/04/2020 04/13/2020 03/06/2020  Weight (lbs) 164 lb 7.4 oz 164 lb 6.4 oz 165 lb  Weight (kg) 74.6 kg 74.571 kg 74.844 kg     Body mass index is 22.94 kg/m.  General:  Well nourished, thin, chronically ill appearing, in no acute distress HEENT: normal Lymph: no adenopathy Neck: no JVD Endocrine:  No thryomegaly Vascular: No carotid bruits  Cardiac:  RRR; no murmurs, gallops or  rubs Lungs:  CTA b/l , no wheezing, rhonchi or rales  Abd: soft, nontender, no hepatomegaly  Ext: no edema Musculoskeletal:  No deformities Skin: warm and dry  Neuro: no focal abnormalities noted Psych:  Normal affect  EKG:  The EKG was personally reviewed and demonstrates:    SB 56bpm, IVCD, LAD, 1st degree AVBlock SR 64bpm, IVCD, LAD, 1st degree AVblock  Telemetry:  Telemetry was personally reviewed and demonstrates:   SR, 1st degree AV intermittent artifact  Relevant CV Studies:  05/04/2020: TTE IMPRESSIONS  1. The LV images are technically difficult , even with Definity contrast  . . Left ventricular ejection fraction, by estimation, is 55 to 60%. The  left ventricle has normal function. The left ventricle has no regional  wall motion abnormalities. Left  ventricular diastolic parameters are consistent with Grade I diastolic  dysfunction (impaired relaxation).  2. Right ventricular systolic function is normal. The right ventricular  size is moderately enlarged.  3. A small pericardial effusion is present. There is no evidence of  cardiac tamponade.  4. The mitral valve is grossly normal. Trivial mitral valve  regurgitation.  5. The aortic valve was not well visualized. Aortic valve regurgitation  is not visualized. No aortic stenosis is present.   02/2020: 2 week monitor 1. SR/SB 2. Occasional PACs/PVCs 3. Second degree AVB 4. 3.2 sec pause  11/15/2018: EPS/Ablation CONCLUSIONS:  1. Sinus rhythm upon presentation.  2. 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 Successful radiofrequency modification of the slow AV nodal pathway  4. No inducible arrhythmias following ablation.  5. No early apparent complications.   06/12/2017: stress myoview  Nuclear stress EF: 63%.  The left ventricular ejection fraction is normal  (55-65%).  The study is normal.  This is a low risk study.   Laboratory Data:  High Sensitivity Troponin:   Recent Labs  Lab 05/03/20 1400 05/03/20 1601 05/04/20 0626  TROPONINIHS 9 10 16      Chemistry Recent Labs  Lab 05/03/20 1400 05/04/20 0314  NA 142 139  K 3.4* 3.5  CL 110 106  CO2 22 22  GLUCOSE 133* 95  BUN 16 14  CREATININE 1.38* 0.88  CALCIUM 9.6 9.6  GFRNONAA 52* >60  ANIONGAP 10 11    Recent Labs  Lab 05/03/20 1400  PROT 6.7  ALBUMIN 3.3*  AST 19  ALT 16  ALKPHOS 44  BILITOT 0.7   Hematology Recent Labs  Lab 05/03/20 1400 05/04/20 0314  WBC 5.2 6.3  RBC 4.92 5.02  HGB 15.1 16.1  HCT 46.5 46.4  MCV 94.5 92.4  MCH 30.7 32.1  MCHC 32.5 34.7  RDW 14.1 13.9  PLT 136* 153   BNPNo results for input(s): BNP, PROBNP in the last 168 hours.  DDimer No results for input(s): DDIMER in the last 168 hours.   Radiology/Studies:  MR BRAIN  WO CONTRAST  Result Date: 05/03/2020 CLINICAL DATA:  Recurrent syncope EXAM: MRI HEAD WITHOUT CONTRAST TECHNIQUE: Multiplanar, multiecho pulse sequences of the brain and surrounding structures were obtained without intravenous contrast. COMPARISON:  MRI head 09/24/2018 FINDINGS: Brain: Negative for acute infarct. Moderate chronic microvascular ischemic change in the white matter bilaterally. Mild chronic ischemic change in the pons. Numerous foci of punctate microhemorrhage throughout both cerebral hemispheres and in the cerebellum bilaterally. No change from the prior study. No large area of hemorrhage. No mass or edema identified. Vascular: Normal arterial flow voids. Skull and upper cervical spine: No focal skeletal abnormality Sinuses/Orbits: Mild mucosal edema paranasal sinuses. Left cataract extraction Other: None IMPRESSION: Negative for acute infarct Atrophy and moderate chronic ischemic change. Numerous foci of chronic microhemorrhage throughout the cerebellum and cerebrum bilaterally. This is unchanged from the  prior study. Differential includes uncontrolled hypertension and cerebral amyloid. Electronically Signed   By: Franchot Gallo M.D.   On: 05/03/2020 21:56   DG Chest Portable 1 View  Result Date: 05/03/2020 CLINICAL DATA:  Syncope EXAM: PORTABLE CHEST 1 VIEW COMPARISON:  12/04/2019 FINDINGS: Persistent elevation of the right hemidiaphragm. No new consolidation or edema. No significant pleural effusion. No pneumothorax. Stable cardiomediastinal contours with normal heart size. IMPRESSION: No acute process in the chest. Electronically Signed   By: Macy Mis M.D.   On: 05/03/2020 14:28   EEG adult  Result Date: 05/03/2020 Dustin Havens, MD     05/03/2020  7:19 PM Patient Name: SALIL RAINERI MRN: [id number] Epilepsy Attending: Lora Hamilton Referring Physician/Provider: Clance Boll, NP Date: 05/03/2020 Duration: 23.12 mins Patient history: 79 yo male with a PMHx of CVA 2014, recurrent syncope, AVB, SVT s/p ablation with "staring spells" and syncope. EEG to evaluate for seizure Level of alertness: Awake AEDs during EEG study: None Technical aspects: This EEG study was done with scalp electrodes positioned according to the 10-20 International system of electrode placement. Electrical activity was acquired at a sampling rate of 500Hz  and reviewed with a high frequency filter of 70Hz  and a low frequency filter of 1Hz . EEG data were recorded continuously and digitally stored. Description: The posterior dominant rhythm consists of 9 Hz activity of moderate voltage (25-35 uV) seen predominantly in posterior head regions, symmetric and reactive to eye opening and eye closing. EEG showed intermittent 2-3hz  delta slowing in right temporal region.  Hyperventilation and photic stimulation were not performed.  ABNORMALITY -Intermittent slow, right temporal region IMPRESSION: This study is suggestive of non-specific cortical dysfuntion in right temporal region. No seizures or epileptiform discharges were  seen throughout the recording. Priyanka O Yadav     Assessment and Plan:   1. Recurrent syncope, dizzy spells  In review of his chart, and monitor in Sept noting symptoms that were NOT associated with arrhythmia or bradycardia, suspicion of this as a cause for his events is low. Agree with avoiding any potential nodal blocking agents with some baseline conduction system disease  I think neurology is perhaps where much of his symptoms may come from with plans for out patient follow up with his neurologist. (recentoly started on namenda and Aricept), they also recommended vestibular testing/clinic, and perhaps further evaluation for seizure.  We recommend a full set of orthostatic vitals Have them send his monitor back for evaluation and keep his cardiology follow up already scheduled  The patient reports that 5 years ago his driving privileges were taken away 2/2 his recurrent fainting and no longer drives.  AT this time,  no clear indication for pacing, and would not place loop prior to getting his wearable monitor findings back.    For questions or updates, please contact [place] Please consult [email or web address] for contact info under    Signed, Baldwin [place], PA-C  05/04/2020 2:45 PM

## 2020-05-04 NOTE — Progress Notes (Addendum)
Spoke to on-call cards provider in regards to suspected VT this morning. Unfortunately, seems there was miscommunication about reason for consult this morning. Consult was placed in concern for suspected sustained VT on telemetry this morning.   On Epic Chart Review tab, in regards to the tele strip at 0515 this morning, she suspects this was a flutter as QRS are evident on lead V. She recommends calling Dr. Caryl Comes who is on call for EP in the morning to further assess other available telemetry.   Wilber Oliphant, M.D.  9:51 PM 05/04/2020

## 2020-05-04 NOTE — Progress Notes (Signed)
  Echocardiogram 2D Echocardiogram has been performed.  Dustin Hamilton 05/04/2020, 9:15 AM

## 2020-05-05 DIAGNOSIS — I472 Ventricular tachycardia: Secondary | ICD-10-CM | POA: Diagnosis not present

## 2020-05-05 DIAGNOSIS — R413 Other amnesia: Secondary | ICD-10-CM | POA: Diagnosis not present

## 2020-05-05 DIAGNOSIS — Z8679 Personal history of other diseases of the circulatory system: Secondary | ICD-10-CM | POA: Diagnosis not present

## 2020-05-05 DIAGNOSIS — I119 Hypertensive heart disease without heart failure: Secondary | ICD-10-CM | POA: Diagnosis not present

## 2020-05-05 DIAGNOSIS — I4892 Unspecified atrial flutter: Secondary | ICD-10-CM

## 2020-05-05 DIAGNOSIS — I484 Atypical atrial flutter: Secondary | ICD-10-CM | POA: Diagnosis not present

## 2020-05-05 DIAGNOSIS — Z8673 Personal history of transient ischemic attack (TIA), and cerebral infarction without residual deficits: Secondary | ICD-10-CM | POA: Diagnosis not present

## 2020-05-05 DIAGNOSIS — R55 Syncope and collapse: Secondary | ICD-10-CM | POA: Diagnosis not present

## 2020-05-05 DIAGNOSIS — N179 Acute kidney failure, unspecified: Secondary | ICD-10-CM | POA: Diagnosis not present

## 2020-05-05 DIAGNOSIS — R972 Elevated prostate specific antigen [PSA]: Secondary | ICD-10-CM | POA: Diagnosis not present

## 2020-05-05 LAB — CBC
HCT: 44.9 % (ref 39.0–52.0)
Hemoglobin: 15.2 g/dL (ref 13.0–17.0)
MCH: 30.9 pg (ref 26.0–34.0)
MCHC: 33.9 g/dL (ref 30.0–36.0)
MCV: 91.3 fL (ref 80.0–100.0)
Platelets: 150 10*3/uL (ref 150–400)
RBC: 4.92 MIL/uL (ref 4.22–5.81)
RDW: 13.9 % (ref 11.5–15.5)
WBC: 5.4 10*3/uL (ref 4.0–10.5)
nRBC: 0 % (ref 0.0–0.2)

## 2020-05-05 LAB — BASIC METABOLIC PANEL
Anion gap: 10 (ref 5–15)
BUN: 17 mg/dL (ref 8–23)
CO2: 22 mmol/L (ref 22–32)
Calcium: 9.7 mg/dL (ref 8.9–10.3)
Chloride: 107 mmol/L (ref 98–111)
Creatinine, Ser: 1.03 mg/dL (ref 0.61–1.24)
GFR, Estimated: >60 mL/min (ref >60)
Glucose, Bld: 108 mg/dL — ABNORMAL HIGH (ref 70–99)
Potassium: 4.5 mmol/L (ref 3.5–5.1)
Sodium: 139 mmol/L (ref 135–145)

## 2020-05-05 LAB — GLUCOSE, CAPILLARY: Glucose-Capillary: 91 mg/dL (ref 70–99)

## 2020-05-05 LAB — MAGNESIUM: Magnesium: 2.3 mg/dL (ref 1.7–2.4)

## 2020-05-05 MED ORDER — APIXABAN 5 MG PO TABS
5.0000 mg | ORAL_TABLET | Freq: Two times a day (BID) | ORAL | Status: DC
  Administered 2020-05-05 – 2020-05-06 (×3): 5 mg via ORAL
  Filled 2020-05-05 (×2): qty 1
  Filled 2020-05-05: qty 2

## 2020-05-05 NOTE — Progress Notes (Signed)
Occupational Therapy Treatment Patient Details Name: Dustin Hamilton MRN: [id number] DOB: [birth year] Today's Date: 05/05/2020    History of present illness 79 y.o. male presenting with syncope after removal of Zio patch believed to be cardiogenic. 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 PMHx significant for CVA, HTN, pSVT with AVNRT ablation in 11/2018, Mobitz Type 1 AV block and dizziness.    OT comments  Pt making steady progress towards OT goals this session. Upon OTA arrival, pt reports he just had an "epsiode" in the bed of passing out however pt denies LOC only endorsing that his vision becomes blurry. Pt continues to present with impaired balance and reports of dizziness during session however feeling seems to subside as session progresses ( see vitals below). Pt able to progress OOB to recliner with MIN A with RW. Pt completed ~ 100ft of functional mobility with RW and MIN A with close chair follow for safety. Set- up pt with wash basin to complete bathing from recliner at end of session. Agree with DC plan below, will follow acutely per POC.   supine- 137/90 EOB- 148/102 EOB after sitting ~ 2 mins 147/107 standing 143/97 recliner after walking ~ 10 ft with legs down -134/118   Follow Up Recommendations  No OT follow up;Supervision/Assistance - 24 hour    Equipment Recommendations  Other (comment) (pt has necessary DME)    Recommendations for Other Services      Precautions / Restrictions Precautions Precautions: Fall Precaution Comments: syncope bouts, orthostatic hypertension Restrictions Weight Bearing Restrictions: No       Mobility Bed Mobility Overal bed mobility: Modified Independent             General bed mobility comments: Pt able to transition supine to sit MOD I with HOB elevated, + rail  Transfers Overall transfer level: Needs assistance Equipment used: Rolling walker (2 wheeled) Transfers: Sit to/from [facility] Sit to Stand: Min  guard Stand pivot transfers: Min assist       General transfer comment: pt sit<>stand x3 during session with no more than min guard for safety; pt initially reports dizziness but feeling subsides with time. pt completed stand pivot to recliner with MIN A for balance and RW mgmt    Balance Overall balance assessment: Needs assistance Sitting-balance support: No upper extremity supported;Feet supported Sitting balance-Leahy Scale: Good Sitting balance - Comments: Static sitting EOB with supervision for safety.   Standing balance support: Bilateral upper extremity supported;No upper extremity supported Standing balance-Leahy Scale: Fair Standing balance comment: Static standing no LOB with bilat UE support on RW                           ADL either performed or assessed with clinical judgement   ADL Overall ADL's : Needs assistance/impaired     Grooming: Wash/dry face;Sitting;Set up Grooming Details (indicate cue type and reason): from recliner                 Toilet Transfer: Minimal assistance;RW;Ambulation;Stand-pivot Toilet Transfer Details (indicate cue type and reason): simulated via functional mobility; MIN A for safety with RW; no c/o dizziness. pt initially pivot to recliner but able to take steps forward with MIN A +1 with RW         Functional mobility during ADLs: Minimal assistance;Rolling walker General ADL Comments: pt initially c/o dizziness when first sitting EOB but reports feeling subsides as session progresses, able to transfer OOB to recliner for  seated ADLs     Vision Baseline Vision/History: Wears glasses Wears Glasses: Reading only Patient Visual Report: Other (comment) (Glaucoma and cataracts) Vision Assessment?: No apparent visual deficits   Perception     Praxis      Cognition Arousal/Alertness: Awake/alert Behavior During Therapy: WFL for tasks assessed/performed Overall Cognitive Status: No family/caregiver present to  determine baseline cognitive functioning                                 General Comments: pt seems slow to process overall and requries verbal cues and increased time to follow commands        Exercises Other Exercises Other Exercises: LAQ x10, seated marches x10 reps from chair   Shoulder Instructions       General Comments upon OTA arrival pt reports having just had a "spell" he describes it as fainting however he reports he does not pass out his vision just goes "blurry" Orhtostatics taken throughout session; supine- 137/90, EOB- 148/102, EOB after sitting ~ 2 mins 147/107, standing 143/97, recliner after walking ~ 10 ft with legs down -134/118. pt reports his wife assist alot with IADLs at home but wife is waiting on hip replacement and is not in good health    Pertinent Vitals/ Pain       Pain Assessment: No/denies pain  Home Living                                          Prior Functioning/Environment              Frequency  Min 2X/week        Progress Toward Goals  OT Goals(current goals can now be found in the care plan section)  Progress towards OT goals: Progressing toward goals  Acute Rehab OT Goals Patient Stated Goal: To return home.  OT Goal Formulation: With patient Time For Goal Achievement: 05/18/20 Potential to Achieve Goals: Good  Plan Discharge plan remains appropriate;Frequency remains appropriate    Co-evaluation                 AM-PAC OT "6 Clicks" Daily Activity     Outcome Measure   Help from another person eating meals?: None Help from another person taking care of personal grooming?: None Help from another person toileting, which includes using toliet, bedpan, or urinal?: A Little Help from another person bathing (including washing, rinsing, drying)?: A Little Help from another person to put on and taking off regular upper body clothing?: None Help from another person to put on and taking off  regular lower body clothing?: A Little 6 Click Score: 21    End of Session Equipment Utilized During Treatment: Gait belt;Rolling walker  OT Visit Diagnosis: Unsteadiness on feet (R26.81);Other abnormalities of gait and mobility (R26.89)   Activity Tolerance Patient tolerated treatment well   Patient Left in chair;with call bell/phone within reach;with chair alarm set;Other (comment) (completing bath from recliner)   Nurse Communication Mobility status;Other (comment) (completing bath from recliner; improved mobility with less compliant of dizziness, does not want diet coke)        Time: 6948-5462 OT Time Calculation (min): 36 min  Charges: OT General Charges $OT Visit: 1 Visit OT Treatments $Self Care/Home Management : 23-37 mins  Lanier Clam., COTA/L Acute Rehabilitation Services [phone]  Garvin Fila Lorriane Dehart 05/05/2020, 3:00 PM

## 2020-05-05 NOTE — Progress Notes (Signed)
Progress Note  Patient Name: Dustin Hamilton Date of Encounter: 05/05/2020  [facility] Cardiologist: Quay Burow, MD   Subjective   Denies any chest pain or dyspnea  Inpatient Medications    Scheduled Meds: . amLODipine  5 mg Oral Daily  . aspirin EC  81 mg Oral Daily  . brimonidine  1 drop Both Eyes BID  . clopidogrel  75 mg Oral Q breakfast  . dutasteride  0.5 mg Oral Daily  . enoxaparin (LOVENOX) injection  40 mg Subcutaneous Q24H  . latanoprost  1 drop Both Eyes QHS  . memantine  17.5 mg Oral QHS  . sodium chloride flush  3 mL Intravenous Q12H  . tamsulosin  0.4 mg Oral QHS   Continuous Infusions:  PRN Meds: acetaminophen **OR** acetaminophen, polyethylene glycol   Vital Signs    Vitals:   05/05/20 0314 05/05/20 0520 05/05/20 0818 05/05/20 0918  BP: 108/82 121/81 129/84 132/86  Pulse: 61 (!) 51 66   Resp:  20 20   Temp:  97.7 F (36.5 C) 98.5 F (36.9 C)   TempSrc:  Oral Oral   SpO2: 95% 96% 95%   Weight:  73.9 kg    Height:        Intake/Output Summary (Last 24 hours) at 05/05/2020 0956 Last data filed at 05/05/2020 0921 Gross per 24 hour  Intake 536 ml  Output 600 ml  Net -64 ml   Last 3 Weights 05/05/2020 05/04/2020 04/13/2020  Weight (lbs) 163 lb 164 lb 7.4 oz 164 lb 6.4 oz  Weight (kg) 73.936 kg 74.6 kg 74.571 kg      Telemetry    Episode of atrial flutter yesterday.  Currently normal sinus rhythm with rates in 50s- Personally Reviewed  ECG    No new ECG - Personally Reviewed  Physical Exam   GEN: No acute distress.   Neck: No JVD Cardiac: RRR, no murmurs, rubs, or gallops.  Respiratory: Clear to auscultation bilaterally. GI: Soft, nontender, non-distended  MS: No edema; No deformity. Neuro:  Nonfocal  Psych: Normal affect   Labs    High Sensitivity Troponin:   Recent Labs  Lab 05/03/20 1400 05/03/20 1601 05/04/20 0626  TROPONINIHS 9 10 16       Chemistry Recent Labs  Lab 05/03/20 1400 05/04/20 0314  05/05/20 0307  NA 142 139 139  K 3.4* 3.5 4.5  CL 110 106 107  CO2 22 22 22   GLUCOSE 133* 95 108*  BUN 16 14 17   CREATININE 1.38* 0.88 1.03  CALCIUM 9.6 9.6 9.7  PROT 6.7  --   --   ALBUMIN 3.3*  --   --   AST 19  --   --   ALT 16  --   --   ALKPHOS 44  --   --   BILITOT 0.7  --   --   GFRNONAA 52* >60 >60  ANIONGAP 10 11 10      Hematology Recent Labs  Lab 05/03/20 1400 05/04/20 0314 05/05/20 0307  WBC 5.2 6.3 5.4  RBC 4.92 5.02 4.92  HGB 15.1 16.1 15.2  HCT 46.5 46.4 44.9  MCV 94.5 92.4 91.3  MCH 30.7 32.1 30.9  MCHC 32.5 34.7 33.9  RDW 14.1 13.9 13.9  PLT 136* 153 150    BNPNo results for input(s): BNP, PROBNP in the last 168 hours.   DDimer No results for input(s): DDIMER in the last 168 hours.   Radiology    MR BRAIN WO CONTRAST  Result Date: 05/03/2020 CLINICAL DATA:  Recurrent syncope EXAM: MRI HEAD WITHOUT CONTRAST TECHNIQUE: Multiplanar, multiecho pulse sequences of the brain and surrounding structures were obtained without intravenous contrast. COMPARISON:  MRI head 09/24/2018 FINDINGS: Brain: Negative for acute infarct. Moderate chronic microvascular ischemic change in the white matter bilaterally. Mild chronic ischemic change in the pons. Numerous foci of punctate microhemorrhage throughout both cerebral hemispheres and in the cerebellum bilaterally. No change from the prior study. No large area of hemorrhage. No mass or edema identified. Vascular: Normal arterial flow voids. Skull and upper cervical spine: No focal skeletal abnormality Sinuses/Orbits: Mild mucosal edema paranasal sinuses. Left cataract extraction Other: None IMPRESSION: Negative for acute infarct Atrophy and moderate chronic ischemic change. Numerous foci of chronic microhemorrhage throughout the cerebellum and cerebrum bilaterally. This is unchanged from the prior study. Differential includes uncontrolled hypertension and cerebral amyloid. Electronically Signed   By: Franchot Gallo M.D.   On:  05/03/2020 21:56   DG Chest Portable 1 View  Result Date: 05/03/2020 CLINICAL DATA:  Syncope EXAM: PORTABLE CHEST 1 VIEW COMPARISON:  12/04/2019 FINDINGS: Persistent elevation of the right hemidiaphragm. No new consolidation or edema. No significant pleural effusion. No pneumothorax. Stable cardiomediastinal contours with normal heart size. IMPRESSION: No acute process in the chest. Electronically Signed   By: Macy Mis M.D.   On: 05/03/2020 14:28   EEG adult  Result Date: 05/03/2020 Lora Havens, MD     05/03/2020  7:19 PM Patient Name: Dustin Hamilton MRN: [id number] Epilepsy Attending: Lora Havens Referring Physician/Provider: Clance Boll, NP Date: 05/03/2020 Duration: 23.12 mins Patient history: 79 yo male with a PMHx of CVA 2014, recurrent syncope, AVB, SVT s/p ablation with "staring spells" and syncope. EEG to evaluate for seizure Level of alertness: Awake AEDs during EEG study: None Technical aspects: This EEG study was done with scalp electrodes positioned according to the 10-20 International system of electrode placement. Electrical activity was acquired at a sampling rate of 500Hz  and reviewed with a high frequency filter of 70Hz  and a low frequency filter of 1Hz . EEG data were recorded continuously and digitally stored. Description: The posterior dominant rhythm consists of 9 Hz activity of moderate voltage (25-35 uV) seen predominantly in posterior head regions, symmetric and reactive to eye opening and eye closing. EEG showed intermittent 2-3hz  delta slowing in right temporal region.  Hyperventilation and photic stimulation were not performed.  ABNORMALITY -Intermittent slow, right temporal region IMPRESSION: This study is suggestive of non-specific cortical dysfuntion in right temporal region. No seizures or epileptiform discharges were seen throughout the recording. Lora Havens   ECHOCARDIOGRAM COMPLETE  Result Date: 05/04/2020    ECHOCARDIOGRAM REPORT   Patient  Name:   Dustin Hamilton Date of Exam: 05/04/2020 Medical Rec #:  [id number]          Height:       71.0 in Accession #:    [id number]         Weight:       164.5 lb Date of Birth:  [birth year]          BSA:          1.940 m Patient Age:    79 years           BP:           141/97 mmHg Patient Gender: M                  HR:  80 bpm. Exam Location:  Inpatient Procedure: 2D Echo, Cardiac Doppler, Color Doppler and Intracardiac            Opacification Agent Indications:    Syncope  History:        Patient has prior history of Echocardiogram examinations, most                 recent 06/09/2017. Stroke and COPD, Arrythmias:PSVT, AV block;                 Risk Factors:Hypertension.  Sonographer:    Dustin Flock Referring Phys: [id number] [facility]  Sonographer Comments: Technically difficult study due to poor echo windows, no parasternal window and suboptimal apical window. Image acquisition challenging due to respiratory motion. IMPRESSIONS  1. The LV images are technically difficult , even with Definity contrast . . Left ventricular ejection fraction, by estimation, is 55 to 60%. The left ventricle has normal function. The left ventricle has no regional wall motion abnormalities. Left ventricular diastolic parameters are consistent with Grade I diastolic dysfunction (impaired relaxation).  2. Right ventricular systolic function is normal. The right ventricular size is moderately enlarged.  3. A small pericardial effusion is present. There is no evidence of cardiac tamponade.  4. The mitral valve is grossly normal. Trivial mitral valve regurgitation.  5. The aortic valve was not well visualized. Aortic valve regurgitation is not visualized. No aortic stenosis is present. FINDINGS  Left Ventricle: The LV images are technically difficult , even with Definity contrast. Left ventricular ejection fraction, by estimation, is 55 to 60%. The left ventricle has normal function. The left ventricle has no regional  wall motion abnormalities.  Definity contrast agent was given IV to delineate the left ventricular endocardial borders. The left ventricular internal cavity size was normal in size. There is no left ventricular hypertrophy. Left ventricular diastolic parameters are consistent with  Grade I diastolic dysfunction (impaired relaxation). Right Ventricle: The right ventricular size is moderately enlarged. No increase in right ventricular wall thickness. Right ventricular systolic function is normal. Left Atrium: Left atrial size was normal in size. Right Atrium: Right atrial size was normal in size. Pericardium: A small pericardial effusion is present. There is no evidence of cardiac tamponade. Mitral Valve: The mitral valve is grossly normal. Trivial mitral valve regurgitation. Tricuspid Valve: The tricuspid valve is not well visualized. Tricuspid valve regurgitation is not demonstrated. Aortic Valve: The aortic valve was not well visualized. Aortic valve regurgitation is not visualized. No aortic stenosis is present. Pulmonic Valve: The pulmonic valve was not well visualized. Pulmonic valve regurgitation is not visualized. Aorta: Aortic root could not be assessed. IAS/Shunts: The atrial septum is grossly normal.   Diastology LV e' medial:    4.57 cm/s LV E/e' medial:  7.3 LV e' lateral:   6.31 cm/s LV E/e' lateral: 5.3  RIGHT VENTRICLE RV Basal diam:  3.10 cm RV S prime:     11.70 cm/s TAPSE (M-mode): 3.2 cm LEFT ATRIUM           Index       RIGHT ATRIUM           Index LA Vol (A4C): 50.4 ml 25.97 ml/m RA Area:     15.70 cm                                   RA Volume:   39.40 ml  20.31 ml/m  AORTIC VALVE LVOT Vmax:   82.10 cm/s LVOT Vmean:  62.300 cm/s LVOT VTI:    0.155 m MITRAL VALVE MV Area (PHT): 7.99 cm    SHUNTS MV Decel Time: 95 msec     Systemic VTI: 0.16 m MV E velocity: 33.40 cm/s MV A velocity: 63.40 cm/s MV E/A ratio:  0.53 Mertie Moores MD Electronically signed by Mertie Moores MD Signature Date/Time:  05/04/2020/11:21:21 AM    Final     Cardiac Studies  Echo 05/04/09:  1. The LV images are technically difficult , even with Definity contrast  . . Left ventricular ejection fraction, by estimation, is 55 to 60%. The  left ventricle has normal function. The left ventricle has no regional  wall motion abnormalities. Left  ventricular diastolic parameters are consistent with Grade I diastolic  dysfunction (impaired relaxation).  2. Right ventricular systolic function is normal. The right ventricular  size is moderately enlarged.  3. A small pericardial effusion is present. There is no evidence of  cardiac tamponade.  4. The mitral valve is grossly normal. Trivial mitral valve  regurgitation.  5. The aortic valve was not well visualized. Aortic valve regurgitation  is not visualized. No aortic stenosis is present.   Patient Profile     79 y.o. male presenting s/p witnessed syncopal episode. PMH is significant for CVA, HTN, pSVT with AVNRT ablation in 11/2018, Mobitz Type 1 AV block, dizziness.   Assessment & Plan    Atrial flutter: Reviewed telemetry strips from 12/3, episode does not appear consistent with ventricular tachycardia, as normal QRS complexes are seen throughout strip on the V lead.  Appears to be in 4:1 atrial flutter.  CHA2DS2-VASc score 5 (hypertension, age x2, CVA).   Echo showed EF 40-34%, Grade I diastolic dysfunction, small pericardial effusion -Recommend starting anticoagulation with Eliquis 5 mg twice daily -Would recommend stopping Plavix since starting Eliquis, can continue on aspirin plus Eliquis if OK with neurology -Rates were controlled while in AFL and given baseline bradycardia would not start AV nodal blocker   For questions or updates, please contact [facility] Please consult [email or web address] for contact info under        Signed, Donato Heinz, MD  05/05/2020, 9:56 AM

## 2020-05-05 NOTE — Progress Notes (Signed)
Family Medicine Teaching Service Daily Progress Note Intern Pager: [phone]  Patient name: Dustin Hamilton Medical record number: [id number] Date of birth: [birth year] Age: 79 y.o. Gender: male  Primary Care Provider: [facility] Consultants: Neurology Code Status: Full  Pt Overview and Major Events to Date:  12/2 Admitted 12/3 Episode of polymorphic VT and atrial flutter  Assessment and Plan: Dustin Hamilton is a 79 y.o. male presenting s/p witnessed syncopal episode . PMH is significant for CVA, HTN, pSVT with AVNRT ablation in 11/2018, Mobitz Type 1 AV block, dizziness.   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 Patient was seen by cardiology on 12/3 and the notes state that there is sinus rhythm with periods of artifact with no sustained arrhythmias or bradycardia. There were concerns that the patient was having episodes of either VT or atrial flutter. Echo showed EF 72-62%, Grade I diastolic dysfunction, small pericardial effusion without evidence of tamponade. Mag level of 2.3, troponin of 16 on 12/3. - Cardiology consulted, will appreciate recs - Continue cardiac monitoring - F/u repeat EKG  Syncope, acute, resolved Currently unsure of the exact cause of the initial syncopal episode that brought the patient to the hospital. Neurology and cardiology have seen the patient and there may be several etiologies occurring including possible temporal lobe epilepsy, vasovagal, and possibly some cardiogenic nature to prior reported pre-syncopal episodes. Echo showed EF 03-55%, Grade I diastolic dysfunction, small pericardial effusion without evidence of tamponade. - Cardiology consulted, appreciate recs - Neurology following, appreciate recs - Cardiac monitoring - F/u orthostatic vitals  HTN: Chronic, stable Home  meds include Amlodipine 5mg  with recommendation to increase to 10mg  if Bps>160 at home. BP overnight ranged between 120s-150s/70s-90s. - Cardiology consulted, will appreciate further recs. - Continue Amlodipine 5mg  daily - Vitals per floor routine  BPH: chronic, stable History of elevated PSA in charts intermittent pain was 5.8.  Patient will need outpatient follow-up. - Continue Dutasteride 0.5mg  daily - Continue Tamusolin 0.4mg  daily  History of CVA in 2014 Neuro exam unremarkable. -PT/OT eval and treat -Continue Plavix 80 mg daily breakfast -Continue ASA 81 mg daily  Glaucoma: Chronic, stable Left we will nonreactive with decreased left visual field (can see the hesitate) - Continue Brimonidine 0.2% ophthalmic solution 1 drop both eyes BID - Continue Lantanoprost 0.005% ophthalmic solution, 1 drop both eyes QHS  Memory impairment: Chronic, stable A&O x4. Currently followed by neurology. - Continue home Namenda 17.5mg  QHS  Elevated creatinine: resolved Cr 1.03. Baseline appears to be around 1.1.  - Continue to monitor  FEN/GI: Regular diet PPx: Lovenox   Status is: Observation  The patient will require care spanning > 2 midnights and should be moved to inpatient because: Ongoing diagnostic testing needed not appropriate for outpatient work up  Dispo:  Patient From: Home  Planned Disposition: To be determined  Expected discharge date: 05/05/20  Medically stable for discharge: No    Subjective:  Patient reports that he is feeling good today, and that he has not had any problems overnight. Patient denies any symptoms including chest pain, dyspnea, headache, vision changes, pre-syncope. Patient states he is feeling great and is ready to go home.  Objective: Temp:  [97.7 F (36.5 C)-98.6 F (37 C)] 97.7 F (36.5 C) (12/04 0520) Pulse Rate:  [51-70] 51 (12/04 0520) Resp:  [20-22] 20 (12/04 0520) BP: (106-158)/(73-92) 121/81 (  12/04 0520) SpO2:  [95 %-97 %] 96 %  (12/04 0520) Weight:  [73.9 kg] 73.9 kg (12/04 0520) Physical Exam: General: NAD, sitting up in bed, wife on phone during exam Cardiovascular: RRR , no M/R/G appreciated Respiratory: CTAB, normal WOB Abdomen: Soft, nontender, nondistended   Laboratory: Recent Labs  Lab 05/03/20 1400 05/04/20 0314 05/05/20 0307  WBC 5.2 6.3 5.4  HGB 15.1 16.1 15.2  HCT 46.5 46.4 44.9  PLT 136* 153 150   Recent Labs  Lab 05/03/20 1400 05/04/20 0314 05/05/20 0307  NA 142 139 139  K 3.4* 3.5 4.5  CL 110 106 107  CO2 22 22 22   BUN 16 14 17   CREATININE 1.38* 0.88 1.03  CALCIUM 9.6 9.6 9.7  PROT 6.7  --   --   BILITOT 0.7  --   --   ALKPHOS 44  --   --   ALT 16  --   --   AST 19  --   --   GLUCOSE 133* 95 108*     Imaging/Diagnostic Tests: ECHOCARDIOGRAM COMPLETE  Result Date: 05/04/2020    ECHOCARDIOGRAM REPORT   Patient Name:   Dustin Hamilton Date of Exam: 05/04/2020 Medical Rec #:  [id number]          Height:       71.0 in Accession #:    [id number]         Weight:       164.5 lb Date of Birth:  [birth year]          BSA:          1.940 m Patient Age:    37 years           BP:           141/97 mmHg Patient Gender: M                  HR:           80 bpm. Exam Location:  Inpatient Procedure: 2D Echo, Cardiac Doppler, Color Doppler and Intracardiac            Opacification Agent Indications:    Syncope  History:        Patient has prior history of Echocardiogram examinations, most                 recent 06/09/2017. Stroke and COPD, Arrythmias:PSVT, AV block;                 Risk Factors:Hypertension.  Sonographer:    Dustin Flock Referring Phys: [id number] [facility]  Sonographer Comments: Technically difficult study due to poor echo windows, no parasternal window and suboptimal apical window. Image acquisition challenging due to respiratory motion. IMPRESSIONS  1. The LV images are technically difficult , even with Definity contrast . . Left ventricular ejection fraction, by estimation, is  55 to 60%. The left ventricle has normal function. The left ventricle has no regional wall motion abnormalities. Left ventricular diastolic parameters are consistent with Grade I diastolic dysfunction (impaired relaxation).  2. Right ventricular systolic function is normal. The right ventricular size is moderately enlarged.  3. A small pericardial effusion is present. There is no evidence of cardiac tamponade.  4. The mitral valve is grossly normal. Trivial mitral valve regurgitation.  5. The aortic valve was not well visualized. Aortic valve regurgitation is not visualized. No aortic stenosis is present. FINDINGS  Left Ventricle: The LV images are technically difficult , even with Definity  contrast. Left ventricular ejection fraction, by estimation, is 55 to 60%. The left ventricle has normal function. The left ventricle has no regional wall motion abnormalities.  Definity contrast agent was given IV to delineate the left ventricular endocardial borders. The left ventricular internal cavity size was normal in size. There is no left ventricular hypertrophy. Left ventricular diastolic parameters are consistent with  Grade I diastolic dysfunction (impaired relaxation). Right Ventricle: The right ventricular size is moderately enlarged. No increase in right ventricular wall thickness. Right ventricular systolic function is normal. Left Atrium: Left atrial size was normal in size. Right Atrium: Right atrial size was normal in size. Pericardium: A small pericardial effusion is present. There is no evidence of cardiac tamponade. Mitral Valve: The mitral valve is grossly normal. Trivial mitral valve regurgitation. Tricuspid Valve: The tricuspid valve is not well visualized. Tricuspid valve regurgitation is not demonstrated. Aortic Valve: The aortic valve was not well visualized. Aortic valve regurgitation is not visualized. No aortic stenosis is present. Pulmonic Valve: The pulmonic valve was not well visualized. Pulmonic  valve regurgitation is not visualized. Aorta: Aortic root could not be assessed. IAS/Shunts: The atrial septum is grossly normal.   Diastology LV e' medial:    4.57 cm/s LV E/e' medial:  7.3 LV e' lateral:   6.31 cm/s LV E/e' lateral: 5.3  RIGHT VENTRICLE RV Basal diam:  3.10 cm RV S prime:     11.70 cm/s TAPSE (M-mode): 3.2 cm LEFT ATRIUM           Index       RIGHT ATRIUM           Index LA Vol (A4C): 50.4 ml 25.97 ml/m RA Area:     15.70 cm                                   RA Volume:   39.40 ml  20.31 ml/m  AORTIC VALVE LVOT Vmax:   82.10 cm/s LVOT Vmean:  62.300 cm/s LVOT VTI:    0.155 m MITRAL VALVE MV Area (PHT): 7.99 cm    SHUNTS MV Decel Time: 95 msec     Systemic VTI: 0.16 m MV E velocity: 33.40 cm/s MV A velocity: 63.40 cm/s MV E/A ratio:  0.53 Mertie Moores MD Electronically signed by Mertie Moores MD Signature Date/Time: 05/04/2020/11:21:21 AM    Final      Rise Patience, DO 05/05/2020, 8:02 AM PGY-1, [facility] pager: [phone], text pages welcome

## 2020-05-05 NOTE — Progress Notes (Signed)
Spoke with Dr. 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    He recommends no antiplatelet therapy with Eliquis.    Appreciate neurology recommendations.  [place] Constable, D.O.  PGY-3 Family Medicine  05/05/2020 2:41 PM

## 2020-05-05 NOTE — Progress Notes (Signed)
Called wife and update on patient's status.  Discussed started Eliquis, reason, and that he will no longer be on plavix and ASA.  Will ensure that she is updated tomorrow as well.  [place] Constable, D.O.  PGY-3 Family Medicine  05/05/2020 2:49 PM

## 2020-05-05 NOTE — Progress Notes (Signed)
Called by family medicine's team resident physician regarding antiplatelet and anticoagulation. Patient has a history of stroke, was on dual antiplatelet-unlikely to be on long-term dual antiplatelet for strokes. Has newfound A. fib-cardiology wanting to initiate anticoagulation. From a neurological standpoint, if anticoagulation needs to be initiated, he should not be on antiplatelets. 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  Use of antiplatelet and anticoagulation or dual antiplatelets will increase his risk of ICH to a much higher degree than anticoagulant alone. Relayed my plan to the on-call resident physician.  -- Amie Portland, MD [facility] Pager: [phone] If 7pm to 7am, please call on call as listed on AMION.

## 2020-05-05 NOTE — Hospital Course (Addendum)
Dustin Hamilton is a 79 y.o. male s/p witnessed syncopal episode, likely multifactorial in nature.  PMH significant for CVA, HTN, pSVT with AVNRT ablation in 11/2018, Mobitz type I AV block, dizziness. See HPI for further details and information.  Syncope, resolved Patient was admitted due to 1 witnessed syncopal episode.  Patient has a history of chronic dizziness and is currently followed by neurology and cardiology.  At the time of the episode, patient was not wearing his Zio patch so there was no way to confirm if an arrhythmia contributed to the event.  EKG showed no ST changes and troponins were negative.  Physical exam and history not suggestive of DVT/PE or CVA.  Cardiology and neurology were consulted.  EEG was performed with noted nonspecific cortical dysfunction in the right temporal region without seizures or epileptiform discharges.  Echocardiogram was obtained with LVEF 55-60% with grade 1 diastolic dysfunction, and small pericardial effusion without evidence of cardiac tamponade. Unable to fully determine the etiology of the syncopal episode, likely multifactorial given patient's cardiac history, CVA history, abnormal EEG finding, and possible vasovagal association.  Recommend close follow-up with outpatient providers for further evaluation of chronic multifactorial episodes of syncope.   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 Patient was noted to have several episodes of abnormal rhythm on telemetry on 12/3 with concerns for VT vs atrial flutter. Magnesium level and troponins within normal limits. Cardiology was consulted to review the telemetry and determined the patient had multiple episodes of 4:1 atrial flutter. Due to new diagnosis, cardiology recommended anticoagulation  with Eliquis 5 mg twice daily rather than current home medications. Recommendations reviewed with Neurology who agreed. Cardiology further recommended avoidance of AV nodal blocker medication due to baseline bradycardia.   Elevated creatinine, improved Patient noted to have a creatinine of 1.3 on admission, patient's baseline is around 1.1 and does not meet criteria for AKI as it was not > 1.5x increased from baseline.  Upon discharge, creatinine 1.03 improved back to baseline.   Hypertension, chronic, stable Patient currently followed-up closely in the outpatient setting. Home medication of amlodipine 5mg  was continued during hospitalization with guidelines to increase to Norvasc 10mg  if systolic blood pressures remained consistently above 160. Patient remained on Norvasc 5mg  and was discharged without medication change.   All other chronic conditions were stable during admission and medications continued as appropriate.    Recommend at discharge:  1.  Follow-up with neurology regarding abnormal EEG with nonspecific cortical dysfunction in the right temporal region.  Patient's anticoagulation regimen changed to Eliquis 5 mg twice daily ONLY. Given samples from our clinic. ASA and plavix have been stopped. 2.  Follow-up with cardiology regarding new diagnosis of atrial flutter.  Patient discharged on Eliquis 5 mg twice daily. 3. PCP follow-up regarding concerns for orthostatic hypotension, likely chronic in nature that could be contributing to syncopal episodes. Discharged on 5mg  Amlodipine (same as home dose). 4. Repeat CBC and BMP this week with PCP

## 2020-05-06 DIAGNOSIS — R972 Elevated prostate specific antigen [PSA]: Secondary | ICD-10-CM | POA: Diagnosis not present

## 2020-05-06 DIAGNOSIS — N179 Acute kidney failure, unspecified: Secondary | ICD-10-CM | POA: Diagnosis not present

## 2020-05-06 DIAGNOSIS — R55 Syncope and collapse: Secondary | ICD-10-CM | POA: Diagnosis not present

## 2020-05-06 DIAGNOSIS — I472 Ventricular tachycardia: Secondary | ICD-10-CM | POA: Diagnosis not present

## 2020-05-06 DIAGNOSIS — Z8679 Personal history of other diseases of the circulatory system: Secondary | ICD-10-CM | POA: Diagnosis not present

## 2020-05-06 DIAGNOSIS — I484 Atypical atrial flutter: Secondary | ICD-10-CM | POA: Diagnosis not present

## 2020-05-06 DIAGNOSIS — I119 Hypertensive heart disease without heart failure: Secondary | ICD-10-CM | POA: Diagnosis not present

## 2020-05-06 DIAGNOSIS — R413 Other amnesia: Secondary | ICD-10-CM | POA: Diagnosis not present

## 2020-05-06 DIAGNOSIS — Z8673 Personal history of transient ischemic attack (TIA), and cerebral infarction without residual deficits: Secondary | ICD-10-CM | POA: Diagnosis not present

## 2020-05-06 DIAGNOSIS — I4892 Unspecified atrial flutter: Secondary | ICD-10-CM | POA: Diagnosis not present

## 2020-05-06 LAB — HEMOGLOBIN AND HEMATOCRIT, BLOOD
HCT: 48.8 % (ref 39.0–52.0)
Hemoglobin: 17.2 g/dL — ABNORMAL HIGH (ref 13.0–17.0)

## 2020-05-06 LAB — GLUCOSE, CAPILLARY: Glucose-Capillary: 96 mg/dL (ref 70–99)

## 2020-05-06 MED ORDER — APIXABAN 5 MG PO TABS: 5.0000 mg | ORAL_TABLET | Freq: Two times a day (BID) | ORAL | 1 refills | Status: DC

## 2020-05-06 MED ORDER — APIXABAN 5 MG PO TABS: 5.0000 mg | ORAL_TABLET | Freq: Two times a day (BID) | ORAL | 0 refills | Status: DC

## 2020-05-06 NOTE — Care Management (Addendum)
05-06-20 1282 Case Manager received call from MD regarding Eliquis. Patient uses the [facility] for medications. The [facility] will provide the patient with 30 day Eliquis samples prior to leaving the hospital. Rx has been e-scribed to the [facility] for refills. Staff RN aware of plan. Case Manager will provide the patient with a 30 day free card. No further needs from Case Manager at this time. Bethena Roys, RN,BSN Case Manager    1001 05-06-20 Case Manager received a call from provider and they wanted to send a Rx to [facility] for Eliquis. Case Manager verified with [facility] that the pharmacy was closed today. Case Manager notified provider Owens Shark. Plan will continue to be samples provided from [facility]. Graves-Bigelow, Ocie Cornfield, RN, BSN Case Manager

## 2020-05-06 NOTE — Progress Notes (Signed)
FMTS Brief Note  Discussed need for apixaban with pharmacy team as well as case manager.  Patient has [employer] and as it is the weekend is very challenging to get him the medication needs.  As such we will provide a sample from our clinic this is lot number [id number], expires March 2023, apixaban 5 mg BID #14 tablets. Full supply with refills at Hand.  Medication recorded at [facility] and directly given to RN Maudie Mercury who will delivery to patient's bedside.   Dustin Singh, MD  Family Medicine Teaching Service

## 2020-05-06 NOTE — Discharge Instructions (Addendum)
  Stop taking Asprin and Plavix  And Start taking Eliquis 5mg  twice daily. We are sending you home with some samples of eliquis. You will get more from your PCP. See info leaflet on eliquis below. Please get blood work with your PCP at the [place].   We will work on arranging a neurology follow up earlier than 27th Dec and they will contact you.   Best wishes,  Dr Posey Pronto     Information on my medicine - ELIQUIS (apixaban)  Why was Eliquis prescribed for you? Eliquis was prescribed for you to reduce the risk of forming blood clots that can cause a stroke if you have a medical condition called atrial fibrillation (a type of irregular heartbeat) OR to reduce the risk of a blood clots forming after orthopedic surgery.  What do You need to know about Eliquis ? Take your Eliquis TWICE DAILY - one tablet in the morning and one tablet in the evening with or without food.  It would be best to take the doses about the same time each day.  If you have difficulty swallowing the tablet whole please discuss with your pharmacist how to take the medication safely.  Take Eliquis exactly as prescribed by your doctor and DO NOT stop taking Eliquis without talking to the doctor who prescribed the medication.  Stopping may increase your risk of developing a new clot or stroke.  Refill your prescription before you run out.  After discharge, you should have regular check-up appointments with your healthcare provider that is prescribing your Eliquis.  In the future your dose may need to be changed if your kidney function or weight changes by a significant amount or as you get older.  What do you do if you miss a dose? If you miss a dose, take it as soon as you remember on the same day and resume taking twice daily.  Do not take more than one dose of ELIQUIS at the same time.  Important Safety Information A possible side effect of Eliquis is bleeding. You should call your healthcare provider right away if  you experience any of the following: ? Bleeding from an injury or your nose that does not stop. ? Unusual colored urine (red or dark brown) or unusual colored stools (red or black). ? Unusual bruising for unknown reasons. ? A serious fall or if you hit your head (even if there is no bleeding).  Some medicines may interact with Eliquis and might increase your risk of bleeding or clotting while on Eliquis. To help avoid this, consult your healthcare provider or pharmacist prior to using any new prescription or non-prescription medications, including herbals, vitamins, non-steroidal anti-inflammatory drugs (NSAIDs) and supplements.  This website has more information on Eliquis (apixaban): [email or web address].

## 2020-05-06 NOTE — Progress Notes (Signed)
Progress Note  Patient Name: BESSIE LIVINGOOD Date of Encounter: 05/06/2020  [facility] Cardiologist: Quay Burow, MD   Subjective   Denies any chest pain or dyspnea  Inpatient Medications    Scheduled Meds: . amLODipine  5 mg Oral Daily  . apixaban  5 mg Oral BID  . brimonidine  1 drop Both Eyes BID  . dutasteride  0.5 mg Oral Daily  . latanoprost  1 drop Both Eyes QHS  . memantine  17.5 mg Oral QHS  . sodium chloride flush  3 mL Intravenous Q12H  . tamsulosin  0.4 mg Oral QHS   Continuous Infusions:  PRN Meds: acetaminophen **OR** acetaminophen, polyethylene glycol   Vital Signs    Vitals:   05/05/20 0918 05/05/20 1611 05/05/20 1929 05/06/20 0446  BP: 132/86 (!) 151/95 (!) 156/93 (!) 158/93  Pulse:  62 68 62  Resp:  20  20  Temp:  97.8 F (36.6 C) (!) 97.5 F (36.4 C) 97.7 F (36.5 C)  TempSrc:  Oral Oral Oral  SpO2:  96% 96% 96%  Weight:      Height:        Intake/Output Summary (Last 24 hours) at 05/06/2020 0711 Last data filed at 05/06/2020 0448 Gross per 24 hour  Intake 6 ml  Output 850 ml  Net -844 ml   Last 3 Weights 05/05/2020 05/04/2020 04/13/2020  Weight (lbs) 163 lb 164 lb 7.4 oz 164 lb 6.4 oz  Weight (kg) 73.936 kg 74.6 kg 74.571 kg      Telemetry    Normal sinus rhythm with rates in 50s- Personally Reviewed  ECG    No new ECG - Personally Reviewed  Physical Exam   GEN: No acute distress.   Neck: No JVD Cardiac: RRR, no murmurs, rubs, or gallops.  Respiratory: Clear to auscultation bilaterally. GI: Soft, nontender, non-distended  MS: No edema; No deformity. Neuro:  Nonfocal  Psych: Normal affect   Labs    High Sensitivity Troponin:   Recent Labs  Lab 05/03/20 1400 05/03/20 1601 05/04/20 0626  TROPONINIHS 9 10 16       Chemistry Recent Labs  Lab 05/03/20 1400 05/04/20 0314 05/05/20 0307  NA 142 139 139  K 3.4* 3.5 4.5  CL 110 106 107  CO2 22 22 22   GLUCOSE 133* 95 108*  BUN 16 14 17   CREATININE 1.38*  0.88 1.03  CALCIUM 9.6 9.6 9.7  PROT 6.7  --   --   ALBUMIN 3.3*  --   --   AST 19  --   --   ALT 16  --   --   ALKPHOS 44  --   --   BILITOT 0.7  --   --   GFRNONAA 52* >60 >60  ANIONGAP 10 11 10      Hematology Recent Labs  Lab 05/03/20 1400 05/04/20 0314 05/05/20 0307  WBC 5.2 6.3 5.4  RBC 4.92 5.02 4.92  HGB 15.1 16.1 15.2  HCT 46.5 46.4 44.9  MCV 94.5 92.4 91.3  MCH 30.7 32.1 30.9  MCHC 32.5 34.7 33.9  RDW 14.1 13.9 13.9  PLT 136* 153 150    BNPNo results for input(s): BNP, PROBNP in the last 168 hours.   DDimer No results for input(s): DDIMER in the last 168 hours.   Radiology    ECHOCARDIOGRAM COMPLETE  Result Date: 05/04/2020    ECHOCARDIOGRAM REPORT   Patient Name:   SONAM HUELSMANN Date of Exam: 05/04/2020 Medical Rec #:  [id number]          Height:       71.0 in Accession #:    [id number]         Weight:       164.5 lb Date of Birth:  03-14-1941          BSA:          1.940 m Patient Age:    62 years           BP:           141/97 mmHg Patient Gender: M                  HR:           80 bpm. Exam Location:  Inpatient Procedure: 2D Echo, Cardiac Doppler, Color Doppler and Intracardiac            Opacification Agent Indications:    Syncope  History:        Patient has prior history of Echocardiogram examinations, most                 recent 06/09/2017. Stroke and COPD, Arrythmias:PSVT, AV block;                 Risk Factors:Hypertension.  Sonographer:    Dustin Flock Referring Phys: [id number] [facility]  Sonographer Comments: Technically difficult study due to poor echo windows, no parasternal window and suboptimal apical window. Image acquisition challenging due to respiratory motion. IMPRESSIONS  1. The LV images are technically difficult , even with Definity contrast . . Left ventricular ejection fraction, by estimation, is 55 to 60%. The left ventricle has normal function. The left ventricle has no regional wall motion abnormalities. Left ventricular diastolic  parameters are consistent with Grade I diastolic dysfunction (impaired relaxation).  2. Right ventricular systolic function is normal. The right ventricular size is moderately enlarged.  3. A small pericardial effusion is present. There is no evidence of cardiac tamponade.  4. The mitral valve is grossly normal. Trivial mitral valve regurgitation.  5. The aortic valve was not well visualized. Aortic valve regurgitation is not visualized. No aortic stenosis is present. FINDINGS  Left Ventricle: The LV images are technically difficult , even with Definity contrast. Left ventricular ejection fraction, by estimation, is 55 to 60%. The left ventricle has normal function. The left ventricle has no regional wall motion abnormalities.  Definity contrast agent was given IV to delineate the left ventricular endocardial borders. The left ventricular internal cavity size was normal in size. There is no left ventricular hypertrophy. Left ventricular diastolic parameters are consistent with  Grade I diastolic dysfunction (impaired relaxation). Right Ventricle: The right ventricular size is moderately enlarged. No increase in right ventricular wall thickness. Right ventricular systolic function is normal. Left Atrium: Left atrial size was normal in size. Right Atrium: Right atrial size was normal in size. Pericardium: A small pericardial effusion is present. There is no evidence of cardiac tamponade. Mitral Valve: The mitral valve is grossly normal. Trivial mitral valve regurgitation. Tricuspid Valve: The tricuspid valve is not well visualized. Tricuspid valve regurgitation is not demonstrated. Aortic Valve: The aortic valve was not well visualized. Aortic valve regurgitation is not visualized. No aortic stenosis is present. Pulmonic Valve: The pulmonic valve was not well visualized. Pulmonic valve regurgitation is not visualized. Aorta: Aortic root could not be assessed. IAS/Shunts: The atrial septum is grossly normal.    Diastology LV e' medial:    4.57  cm/s LV E/e' medial:  7.3 LV e' lateral:   6.31 cm/s LV E/e' lateral: 5.3  RIGHT VENTRICLE RV Basal diam:  3.10 cm RV S prime:     11.70 cm/s TAPSE (M-mode): 3.2 cm LEFT ATRIUM           Index       RIGHT ATRIUM           Index LA Vol (A4C): 50.4 ml 25.97 ml/m RA Area:     15.70 cm                                   RA Volume:   39.40 ml  20.31 ml/m  AORTIC VALVE LVOT Vmax:   82.10 cm/s LVOT Vmean:  62.300 cm/s LVOT VTI:    0.155 m MITRAL VALVE MV Area (PHT): 7.99 cm    SHUNTS MV Decel Time: 95 msec     Systemic VTI: 0.16 m MV E velocity: 33.40 cm/s MV A velocity: 63.40 cm/s MV E/A ratio:  0.53 Mertie Moores MD Electronically signed by Mertie Moores MD Signature Date/Time: 05/04/2020/11:21:21 AM    Final     Cardiac Studies  Echo 05/04/09:  1. The LV images are technically difficult , even with Definity contrast  . . Left ventricular ejection fraction, by estimation, is 55 to 60%. The  left ventricle has normal function. The left ventricle has no regional  wall motion abnormalities. Left  ventricular diastolic parameters are consistent with Grade I diastolic  dysfunction (impaired relaxation).  2. Right ventricular systolic function is normal. The right ventricular  size is moderately enlarged.  3. A small pericardial effusion is present. There is no evidence of  cardiac tamponade.  4. The mitral valve is grossly normal. Trivial mitral valve  regurgitation.  5. The aortic valve was not well visualized. Aortic valve regurgitation  is not visualized. No aortic stenosis is present.   Patient Profile     79 y.o. male presenting s/p witnessed syncopal episode. PMH is significant for CVA, HTN, pSVT with AVNRT ablation in 11/2018, Mobitz Type 1 AV block, dizziness.   Assessment & Plan    Atrial flutter: Reviewed telemetry strips from 12/3, episode does not appear consistent with ventricular tachycardia, as normal QRS complexes are seen throughout strip on  the V lead.  Appears to be atrial flutter.  CHA2DS2-VASc score 5 (hypertension, age x2, CVA).   Echo showed EF 38-25%, Grade I diastolic dysfunction, small pericardial effusion -Continue Eliquis 5 mg twice daily -Rates appeared controlled while in AFL and given baseline bradycardia would not start AV nodal blocker   For questions or updates, please contact [facility] Please consult [email or web address] for contact info under        Signed, Donato Heinz, MD  05/06/2020, 7:11 AM

## 2020-05-06 NOTE — Progress Notes (Signed)
All discharge instructions including medication, prescription medication and follow-up  provided to patient and patient's son at bedside. Apixaban 5mg  BID #14 tablets given to son and informed refills will go to [facility]. IVs removed and all belonging returned to patient. No further question ask, patient discharge.

## 2020-05-06 NOTE — Discharge Summary (Addendum)
I have reviewed the below documentation. I have seen the patient on the day of discharge and agree with the below exam.  In addition to below, I recommend the following at follow up:  Follow up with PCP for CBC and BMP this week.  Wife and patient educated about Eliquis use including risk of bleeding, presenting to ED for falls, no concurrent NSAID use.  Addition to below--stop Meclizine.    Dorris Singh, MD  [facility] Discharge Summary  Patient name: Dustin Hamilton Medical record number: [id number] Date of birth: [birth year] Age: 79 y.o. 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 Abdomen soft and non-tender.  Extremities: No peripheral edema.  Neuro: Cranial nerves grossly intact  Brief Hospital Course:  Dustin Hamilton is a 79 y.o. male s/p witnessed syncopal episode, likely multifactorial in nature.  PMH significant for CVA, HTN, pSVT with AVNRT ablation in 11/2018, Mobitz type I AV block, dizziness. See HPI for further details and information.  Syncope, resolved Patient was admitted due to 1 witnessed syncopal episode.  Patient has a history of chronic dizziness  and is currently followed by neurology and cardiology.  At the time of the episode, patient was not wearing his Zio patch so there was no way to confirm if an arrhythmia contributed to the event.  EKG showed no ST changes and troponins were negative.  Physical exam and history not suggestive of DVT/PE or CVA.  Cardiology and neurology were consulted.  EEG was performed with noted nonspecific cortical dysfunction in the right temporal region without seizures or epileptiform discharges.  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   Echocardiogram was obtained with LVEF 55-60% with grade 1 diastolic dysfunction, and small pericardial effusion without evidence of cardiac tamponade. Unable to fully determine the etiology of the syncopal episode, likely multifactorial given patient's cardiac history, CVA history, abnormal EEG finding, and possible vasovagal association.  Recommend close follow-up with outpatient providers for further evaluation of chronic multifactorial episodes of syncope.   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 Patient was noted to have several episodes of abnormal rhythm on telemetry on 12/3 with concerns for VT vs atrial flutter. Magnesium level and troponins within normal limits. Cardiology was consulted to review the telemetry and determined the patient had multiple episodes of 4:1 atrial flutter. Due to new diagnosis, cardiology recommended anticoagulation with Eliquis 5 mg twice daily + ASA. However, patient has history of cerebral amyloid angiopathy. The bleeding risk of Apixaban was discussed with wife  and patient.  Recommendations reviewed with Neurology who recommended Apixaban alone for [facility]. Cardiology further  recommended avoidance of AV nodal blocker medication due to baseline bradycardia.   Elevated creatinine, improved Patient noted to have a creatinine of 1.3 on admission, patient's baseline is around 1.1 and does not meet criteria for AKI as it was not > 1.5x increased from baseline.  Upon discharge, creatinine 1.03 improved back to baseline.   Hypertension, chronic, stable Patient currently followed-up closely in the outpatient setting. Home medication of amlodipine 5mg  was continued during hospitalization with guidelines to increase to Norvasc 10mg  if systolic blood pressures remained consistently above 160. Patient remained on Norvasc 5mg  and was discharged without medication change.   All other chronic conditions were stable during admission and medications continued as appropriate.    Recommend at discharge:  1.  Follow-up with neurology regarding abnormal EEG with nonspecific cortical dysfunction in the right temporal region.  Messaged Florian Buff, RN about this.  2.  Patient's anticoagulation regimen changed to Eliquis 5 mg twice daily ONLY. Given samples from our clinic. ASA and plavix have been stopped. 3.  Follow-up with cardiology regarding new diagnosis of atrial flutter.  Patient discharged on Eliquis 5 mg twice daily. 4. PCP follow-up regarding concerns for orthostatic hypotension, likely chronic in nature that could be contributing to syncopal episodes. Discharged on 5mg  Amlodipine (same as home dose). 5. Repeat CBC and BMP this week with PCP    Significant Procedures:   Significant Labs and Imaging:  Recent Labs  Lab 05/03/20 1400 05/03/20 1400 05/04/20 0314 05/05/20 0307 05/06/20 0923  WBC 5.2  --  6.3 5.4  --   HGB 15.1   < > 16.1 15.2 17.2*  HCT 46.5   < > 46.4 44.9 48.8  PLT 136*  --  153 150  --    < > = values in this interval not displayed.   Recent Labs  Lab 05/03/20 1400 05/03/20 1400 05/04/20 0314 05/04/20 0626 05/05/20 0307  NA 142  --  139  --   139  K 3.4*   < > 3.5  --  4.5  CL 110  --  106  --  107  CO2 22  --  22  --  22  GLUCOSE 133*  --  95  --  108*  BUN 16  --  14  --  17  CREATININE 1.38*  --  0.88  --  1.03  CALCIUM 9.6  --  9.6  --  9.7  MG  --   --   --  1.9 2.3  ALKPHOS 44  --   --   --   --   AST 19  --   --   --   --   ALT 16  --   --   --   --   ALBUMIN 3.3*  --   --   --   --    < > = values in this interval not displayed.     Results/Tests Pending at Time of Discharge:   Discharge Medications:  Allergies as of 05/06/2020      Reactions   Penicillins Hives      Medication List    STOP taking these medications   aspirin EC 81 MG tablet   cetirizine 5 MG chewable tablet Commonly known as: ZYRTEC   clopidogrel 75 MG tablet Commonly known as: PLAVIX     TAKE these medications   amLODipine 5 MG tablet  Commonly known as: NORVASC Take 5 mg by mouth daily.   apixaban 5 MG Tabs tablet Commonly known as: ELIQUIS Take 1 tablet (5 mg total) by mouth 2 (two) times daily.   brimonidine 0.2 % ophthalmic solution Commonly known as: ALPHAGAN Place 1 drop into both eyes 2 (two) times daily.   dutasteride 0.5 MG capsule Commonly known as: AVODART Take 0.5 mg by mouth daily.   latanoprost 0.005 % ophthalmic solution Commonly known as: XALATAN Place 1 drop into both eyes at bedtime.   meclizine 25 MG tablet Commonly known as: ANTIVERT Take 25 mg by mouth 3 (three) times daily as needed for dizziness.   memantine 5 MG tablet Commonly known as: NAMENDA Take 17.5 mg by mouth at bedtime.   tamsulosin 0.4 MG Caps capsule Commonly known as: FLOMAX Take 0.4 mg by mouth at bedtime.       Discharge Instructions: Please refer to Patient Instructions section of EMR for full details.  Patient was counseled important signs and symptoms that should prompt return to medical care, changes in medications, dietary instructions, activity restrictions, and follow up appointments.   Follow-Up Appointments:   Follow-up Information    [facility] Follow up.   Specialty: Rehabilitation Why: The outpatient rehab will contact you for the first appointment Contact information: [address] [id number] [place]       [facility], [place] Follow up.   Specialties: Cardiology, Radiology Why: 05/22/2020 @ 11:45AM Contact information: [address] [place] [address] [phone]        [facility] Follow up in 2 day(s).   Why: follow up with PCP in the next 2-3 days  Contact information: [place] [address] [phone]        Lorretta Harp, MD .   Specialties: Cardiology, Radiology Contact information: [address] [place] [address] [phone]        Evans Lance, MD .   Specialty: Cardiology Contact information: [address] [place] [address] [phone]               Lattie Haw, MD 05/06/2020, 1:53 PM PGY-2 [place]

## 2020-05-06 NOTE — Progress Notes (Signed)
Called Brenda-CM for patient regarding Eliquis. She asked patient who gets his meds from the [place]. It is a long process and will take some time for his meds to be filled. I have sent the meds to [facility] with 1 refill and said we will give him some samples from our clinic to go home with.   Lattie Haw MD  PGY2, Family Medicine

## 2020-05-06 NOTE — Progress Notes (Signed)
Correction to my previous note. Meds sent to [facility] for collection wth 30 day free card. CM will provide this card.  [place]

## 2020-05-07 ENCOUNTER — Telehealth: Payer: Self-pay | Admitting: *Deleted

## 2020-05-07 NOTE — Telephone Encounter (Signed)
Received staff message for Dr Dorris Singh :Dustin Hamilton was recently admitted for a syncopal spell. His EEG did show some (likely nonspecific) focal slowing. Wife is concerned and would like closer follow up with Dr. [name] if at all possible. He was seen by Dr. Curly Shores during his stay in the hospital (see very excellent note dated 12/2).  Called wife, Dustin Hamilton on [place] and moved appointment up two weeks to 05/14/20. Advised they arrive 30 minutes early, bring new insurance card and updated med list. Dustin Hamilton  verbalized understanding, appreciation.

## 2020-05-07 NOTE — Telephone Encounter (Signed)
Ok to setup follow up appt in next 2-4 weeks. -VRP

## 2020-05-08 DIAGNOSIS — M25561 Pain in right knee: Secondary | ICD-10-CM | POA: Diagnosis not present

## 2020-05-09 ENCOUNTER — Ambulatory Visit: Payer: Medicare PPO | Admitting: Cardiology

## 2020-05-14 ENCOUNTER — Encounter: Payer: Self-pay | Admitting: Diagnostic Neuroimaging

## 2020-05-14 ENCOUNTER — Ambulatory Visit: Payer: Medicare PPO | Admitting: Diagnostic Neuroimaging

## 2020-05-14 ENCOUNTER — Other Ambulatory Visit: Payer: Self-pay

## 2020-05-14 VITALS — BP 144/82 | HR 63 | Ht 71.0 in | Wt 170.0 lb

## 2020-05-14 DIAGNOSIS — F0390 Unspecified dementia without behavioral disturbance: Secondary | ICD-10-CM

## 2020-05-14 DIAGNOSIS — R55 Syncope and collapse: Secondary | ICD-10-CM | POA: Diagnosis not present

## 2020-05-14 DIAGNOSIS — R4689 Other symptoms and signs involving appearance and behavior: Secondary | ICD-10-CM | POA: Diagnosis not present

## 2020-05-14 DIAGNOSIS — F03A0 Unspecified dementia, mild, without behavioral disturbance, psychotic disturbance, mood disturbance, and anxiety: Secondary | ICD-10-CM

## 2020-05-14 MED ORDER — LEVETIRACETAM ER 500 MG PO TB24: 500.0000 mg | ORAL_TABLET | Freq: Every evening | ORAL | 12 refills | Status: DC

## 2020-05-14 NOTE — Progress Notes (Signed)
[facility]  PATIENT: Dustin Hamilton DOB: 1941/03/02  REFERRING CLINICIAN: [facility]  HISTORY FROM: patient and wife  REASON FOR VISIT: FOLLOW UP    HISTORICAL  CHIEF COMPLAINT:  Chief Complaint  Patient presents with  . Dizziness    Rm 7 Int referral for syncope, dizziness, wife- [name], "in hospital for syncope"  MMSE 26    HISTORY OF PRESENT ILLNESS:   UPDATE (05/14/20, VRP): Since last visit, admitted for syncope (Dec 2021). EEG showed some slowing, but no epileptiform discharges. Has continued to have mild dizzy spells without LOC. During our evaluation patient had a witnessed event; he said "I feel it coming on", then he leaned over, said "oh no; oh no", grabbed his walker handled, breathed heavily for 1-2 minutes, could squeeze my hand on command, but not could not say his name initially, but then could speak later on. Patient was amnestic to the spell. This is similar to events witnessed by wife at home (~3-4 per month; zone out; not able to speak).   UPDATE (03/14/19, VRP): Since last visit, doing about the same. Symptoms are stable. Severity is moderate. No alleviating or aggravating factors. Tolerating meds. Memory stable. Balance is poor.   UPDATE (09/06/18, VRP): Since last visit, doing well until 1 weeks ago; patient woke up with new onset of slurred speech, left arm weakness, left arm numbness, trouble walking.  Wife called EMS for evaluation.  By the time they arrived symptoms were starting to improve.  He was able to stand and walk.  His blood pressure was 150s over 80s.  He did not have any fever.  Due to current virus pandemic, mild symptoms, rapidly improving symptoms, apparently EMS told patient that it would be safer if he stays at home, although I cannot verify this information.  Since that time symptoms have significantly improved.  His slurred speech has improved.  He still has weakness and numbness of his left arm.  His balance  is unsteady but slightly improving.  He is tolerating his medications.  Memory stable.  PRIOR HPI 79 year old male here for evaluation of memory loss.  Patient has had at least 3 to 4 years of gradual onset progressive short-term memory loss, confusion, cognitive decline.  He previously diagnosed with vascular dementia.  Past 2 years patient has had intermittent staring spells, zoning out spells, 3-4 times per month.  Sometimes associated with lightheadedness, dizziness and presyncope symptoms.  One time patient went to the hospital for evaluation and was found to have hypotension.  His blood pressure medicines have been adjusted and blood pressure has been improved since August 2019 and patient has not had any of these staring spells since that time.    REVIEW OF SYSTEMS: Full 14 system review of systems performed and negative with exception of: as per HPI.    ALLERGIES: Allergies  Allergen Reactions  . Penicillins Hives    HOME MEDICATIONS: Outpatient Medications Prior to Visit  Medication Sig Dispense Refill  . amLODipine (NORVASC) 5 MG tablet Take 5 mg by mouth daily.    Marland Kitchen apixaban (ELIQUIS) 5 MG TABS tablet Take 1 tablet (5 mg total) by mouth 2 (two) times daily. 60 tablet 0  . brimonidine (ALPHAGAN) 0.2 % ophthalmic solution Place 1 drop into both eyes 2 (two) times daily.    Marland Kitchen dutasteride (AVODART) 0.5 MG capsule Take 0.5 mg by mouth daily.    Marland Kitchen latanoprost (XALATAN) 0.005 % ophthalmic solution Place 1 drop into both eyes  at bedtime.     . meclizine (ANTIVERT) 25 MG tablet Take 25 mg by mouth 3 (three) times daily as needed for dizziness.    . memantine (NAMENDA) 5 MG tablet Take 17.5 mg by mouth at bedtime.     . predniSONE (DELTASONE) 20 MG tablet Take 20 mg by mouth. 05/14/20 one a day x 4 more days    . tamsulosin (FLOMAX) 0.4 MG CAPS capsule Take 0.4 mg by mouth at bedtime.      No facility-administered medications prior to visit.    PAST MEDICAL HISTORY: Past Medical  History:  Diagnosis Date  . BPH (benign prostatic hyperplasia)   . Cerebrovascular disease   . Congenital anomaly of diaphragm   . Elevated PSA   . Glaucoma, both eyes   . Hemorrhoid   . Hepatitis B surface antigen positive    02-20-2011  . History of adenomatous polyp of colon    2007, 2009 and 2013  tubular adenoma's  . History of alcohol abuse    quit 1963  . History of cerebral parenchymal hemorrhage    01/ 2006  left occiptial lobe related to hypertensive crisis  . History of CVA (cerebrovascular accident)    09-12-2012  left hippocampus/ amygdala junction and per MRI old white matter infarcts--  per pt residual short- term memory issues  . History of fatty infiltration of liver hx visit's at [facility] , last visit 05/ 2014   elvated LFT's ,  via liver bx 2004 related to hx alcohol and drug abuse (quit 1964)  . 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 HTN (hypertension)   . Renal cyst, left   . Stroke ([place])    hx of 3 strokes in past   . 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 Urethral lesion    urethral mass    PAST SURGICAL HISTORY: Past Surgical History:  Procedure Laterality Date  . CARDIOVASCULAR STRESS TEST  05/05/2007   normal nuclear study w/ no ischemia/  normal LV fucntion and wall motion , ef60%  . COLONOSCOPY  last one 04-06-2012  . CYSTO/  LEFT RETROGRADE PYELOGRAM/ CYTOLOGY WASHINGS/  URETEROSCOPY  03/05/2000  . INGUINAL HERNIA REPAIR Bilateral 1965 and 1980's  . LAPAROSCOPIC INGUINAL HERNIA WITH UMBILICAL HERNIA Right 81/06/7508  . LIVER BIOPSY  1980's and 2004  . SVT ABLATION N/A 11/15/2018   Procedure: SVT ABLATION;  Surgeon: Evans Lance, MD;  Location: [facility];  Service: Cardiovascular;  Laterality: N/A;  . TRANSTHORACIC ECHOCARDIOGRAM  09/13/2012   moderate LVH,  ef 60-65%/    . TRANSURETHRAL RESECTION OF BLADDER TUMOR N/A 08/11/2016   Procedure: TRANSURETHRAL  RESECTION OF BLADDER TUMOR (TURBT);  Surgeon: Cleon Gustin, MD;  Location: [facility];  Service: Urology;  Laterality: N/A;    FAMILY HISTORY: Family History  Problem Relation Age of Onset  . Rheum arthritis Mother   . Diabetes Mother   . Stroke Mother   . Heart attack Mother   . Kidney failure Mother   . Heart attack Father   . Heart disease Maternal Grandmother   . Rheum arthritis Maternal Grandmother   . Diabetes Maternal Grandmother   . Stroke Maternal Grandmother   . Colon cancer Neg Hx     SOCIAL HISTORY: Social History   Socioeconomic History  . Marital status: Married    Spouse name: [name]  .  Number of children: 1  . Years of education: College  . Highest education level: Not on file  Occupational History  . Occupation: Geophysicist/field seismologist  Tobacco Use  . Smoking status: Former Smoker    Packs/day: 1.00    Years: 5.00    Pack years: 5.00    Types: Cigarettes    Quit date: 11/30/1981    Years since quitting: 38.4  . Smokeless tobacco: Never Used  Vaping Use  . Vaping Use: Never used  Substance and Sexual Activity  . Alcohol use: No    Alcohol/week: 0.0 standard drinks    Comment: hx abuse -- quit:  1963  . 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 Sexual activity: Not on file  Other Topics Concern  . Not on file  Social History Narrative   Patient lives at home with his spouse.   Caffeine Use:  Tea, lots   Social Determinants of Health   Financial Resource Strain: Not on file  Food Insecurity: Not on file  Transportation Needs: Not on file  Physical Activity: Not on file  Stress: Not on file  Social Connections: Not on file  Intimate Partner Violence: Not on file     PHYSICAL EXAM  GENERAL EXAM/CONSTITUTIONAL: Vitals:  Vitals:   05/14/20 1245  BP: (!) 144/82  Pulse: 63  Weight: 170 lb (77.1 kg)  Height: 5\' 11"  (1.803 m)   Body mass index is 23.71 kg/m. Wt Readings from Last 3 Encounters:   05/14/20 170 lb (77.1 kg)  05/05/20 163 lb (73.9 kg)  04/13/20 164 lb 6.4 oz (74.6 kg)    Patient is in no distress; well developed, nourished and groomed; neck is supple  CARDIOVASCULAR:  Examination of carotid arteries is normal; no carotid bruits  Regular rate and rhythm, no murmurs  Examination of peripheral vascular system by observation and palpation is normal  EYES:  Ophthalmoscopic exam of optic discs and posterior segments is normal; no papilledema or hemorrhages No exam data present  MUSCULOSKELETAL:  Gait, strength, tone, movements noted in Neurologic exam below  NEUROLOGIC: MENTAL STATUS:  MMSE - [place] Exam 05/14/2020 03/14/2019 03/15/2018  Orientation to time 5 5 4   Orientation to Place 4 4 4   Registration 3 3 3   Attention/ Calculation 3 4 2   Recall 3 3 3   Language- name 2 objects 2 2 2   Language- repeat 1 1 1   Language- follow 3 step command 3 3 3   Language- read & follow direction 1 1 1   Write a sentence 1 1 1   Copy design 0 1 0  Total score 26 28 24     awake, alert, oriented to person, place and time  recent and remote memory intact  normal attention and concentration  language fluent, comprehension intact, naming intact  fund of knowledge appropriate  CRANIAL NERVE:   2nd - no papilledema on fundoscopic exam  2nd, 3rd, 4th, 6th - pupils equal and reactive to light, visual fields full to confrontation, extraocular muscles intact, no nystagmus  5th - facial sensation symmetric  7th - facial strength symmetric  8th - hearing intact  9th - palate elevates symmetrically, uvula midline  11th - shoulder shrug symmetric  12th - tongue protrusion midline  MOTOR:   normal bulk and tone, full strength in the BUE, BLE  SENSORY:   normal and symmetric to light touch, temperature, vibration  COORDINATION:   finger-nose-finger, fine finger movements normal  REFLEXES:   deep tendon reflexes present and  symmetric  GAIT/STATION:   narrow based gait      DIAGNOSTIC DATA (LABS, IMAGING, TESTING) - I reviewed patient records, labs, notes, testing and imaging myself where available.  Lab Results  Component Value Date   WBC 5.4 05/05/2020   HGB 17.2 (H) 05/06/2020   HCT 48.8 05/06/2020   MCV 91.3 05/05/2020   PLT 150 05/05/2020      Component Value Date/Time   NA 139 05/05/2020 0307   NA 143 11/12/2018 0950   K 4.5 05/05/2020 0307   CL 107 05/05/2020 0307   CO2 22 05/05/2020 0307   GLUCOSE 108 (H) 05/05/2020 0307   BUN 17 05/05/2020 0307   BUN 15 11/12/2018 0950   CREATININE 1.03 05/05/2020 0307   CALCIUM 9.7 05/05/2020 0307   PROT 6.7 05/03/2020 1400   ALBUMIN 3.3 (L) 05/03/2020 1400   AST 19 05/03/2020 1400   ALT 16 05/03/2020 1400   ALKPHOS 44 05/03/2020 1400   BILITOT 0.7 05/03/2020 1400   GFRNONAA >60 05/05/2020 0307   GFRAA 60 (L) 02/28/2020 1152   Lab Results  Component Value Date   CHOL 123 09/13/2012   HDL 25 (L) 09/13/2012   LDLCALC 76 09/13/2012   TRIG 112 09/13/2012   CHOLHDL 4.9 09/13/2012   Lab Results  Component Value Date   HGBA1C 6.2 (H) 09/13/2012   Lab Results  Component Value Date   VITAMINB12 431 06/28/2013   Lab Results  Component Value Date   TSH 1.050 05/04/2020    10/16/16 MRI HEAD [I reviewed images myself and agree with interpretation. Moderate ventriculomegaly and atrophy.  -VRP]  1. No acute intracranial infarct or other process identified. 2. Innumerable chronic micro hemorrhages scattered throughout the brain, most likely related to chronic underlying hypertension given patient history. 3. Stable atrophy with moderate chronic microvascular ischemic disease.  10/16/16 MRA HEAD [I reviewed images myself and agree with interpretation. -VRP]  Negative intracranial MRA. No large or proximal arterial branch occlusion. No high-grade or correctable stenosis.  10/16/16 MRA NECK [I reviewed images myself and agree with  interpretation. -VRP]  1. Negative MRA of the neck. No hemodynamically significant or critical stenosis identified within the neck.  2. 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  01/23/17 MRI brain - No acute treatment pathology identified, specifically no acute infarct. - Remote lacunar infarcts in the bilateral frontal lobe centrum semiovale. - Moderate chronic small vessel ischemic change. - Multiple scattered foci of susceptibility most abundant within the peripheral cerebrum and cerebellum which may reflect amyloid angiopathy pattern.   07/08/17 CT head [I reviewed images myself and agree with interpretation. -VRP]  1. No CT evidence for acute intracranial abnormality. 2. Atrophy and small vessel ischemic changes of the white matter.  06/09/17 TTE - Left ventricle: The cavity size was normal. Systolic function was   normal. The estimated ejection fraction was in the range of 60%   to 65%. Wall motion was normal; there were no regional wall   motion abnormalities. There was an increased relative   contribution of atrial contraction to ventricular filling.   Doppler parameters are consistent with abnormal left ventricular   relaxation (grade 1 diastolic dysfunction). Doppler parameters   are consistent with high ventricular filling pressure. - Mitral valve: There was trivial regurgitation. - Pulmonary arteries: Systolic pressure could not be accurately   estimated.  09/07/18 carotid u/s Right Carotid: There was no evidence of thrombus, dissection, atherosclerotic  plaque or stenosis in the cervical carotid system. Left Carotid: There is no evidence of stenosis in the left ICA. The extracranial               vessels were near-normal with only minimal wall thickening or               plaque. Vertebrals:  Bilateral vertebral arteries demonstrate antegrade flow. Subclavians: Normal flow hemodynamics were seen in bilateral subclavian               arteries.  09/24/18 MRI brain - Acute nonhemorrhagic white matter infarct, RIGHT centrum semiovale. - Atrophy and small vessel disease.  11/15/18 EP study 1. Sinus rhythm upon presentation.  2. 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 Successful radiofrequency modification of the slow AV nodal pathway  4. No inducible arrhythmias following ablation.  5. No early apparent complications.   05/03/20 EEG - This study is suggestive of non-specific cortical dysfuntion in right temporal region. No seizures or epileptiform discharges were seen throughout the recording.  05/03/20 MRI brain - Negative for acute infarct - Atrophy and moderate chronic ischemic change. - Numerous foci of chronic microhemorrhage throughout the cerebellum and cerebrum bilaterally. This is unchanged from the prior study. Differential includes uncontrolled hypertension and cerebral amyloid.    ASSESSMENT AND PLAN  79 y.o. year old male here with gradual onset progressive short-term memory loss confusion and decline in ADLs, consistent with mild dementia.  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  Abnormal spells of staring and zoning out are likely presyncope and hypotension related.  Complex partial seizures are possible but symptoms seem to have improved since blood pressure medicines have been adjusted.   Dx:  1. Spell of abnormal behavior   2. Mild dementia ([place])   3. Syncope, unspecified syncope type      PLAN:  ABNORMAL SPELL / CONFUSION (witnessed event in clinic today 05/14/20; possible complex partial seizure) - start levetiracetam ER 500mg  at bedtime  RECURRENT DIZZINESS / SYNCOPE / PRESYNCOPE - continue medical mgmt of BP, afib  DEMENTIA (VASCULAR + neurodegenerative / alzheimer's) - continue memantine (previously tried donepezil) -  safety / supervision issues reviewed - NO driving; caution with finances and medication mgmt  LEFT ARM NUMBNESS / WEAKNESS / SLURRED SPEECH (small vessel stroke; improved) - continue plavix and BP control - follow up with PCP re: lipid and diabetes screening - stroke recovery exercises reviewed  Meds ordered this encounter  Medications  . levETIRAcetam (KEPPRA XR) 500 MG 24 hr tablet    Sig: Take 1 tablet (500 mg total) by mouth every evening.    Dispense:  30 tablet    Refill:  12   Return in about 6 months (around 11/12/2020).    Penni Bombard, MD 40/34/7425, 9:56 PM Certified in Neurology, Neurophysiology and Neuroimaging  [facility] [address] [place], [place] [address] [phone]

## 2020-05-14 NOTE — Patient Instructions (Addendum)
  ABNORMAL SPELL / CONFUSION (witnessed event in clinic today 05/14/20; possible complex partial seizure) - start levetiracetam ER 500mg  at bedtime  DEMENTIA - continue memantine (previously tried donepezil) - safety / supervision issues reviewed - NO driving; caution with finances and medication mgmt

## 2020-05-17 ENCOUNTER — Telehealth: Payer: Self-pay | Admitting: Diagnostic Neuroimaging

## 2020-05-17 NOTE — Telephone Encounter (Signed)
[name] wife, Koah Chisenhall (on [place]) called, yesterday took levETIRAcetam (KEPPRA XR) 500 MG 24 hr tablet and made him sick; dizzy, was not able to function. I did not give to him last night. Would like a call from the nurse.

## 2020-05-17 NOTE — Telephone Encounter (Signed)
Called Dustin Hamilton and advised her of Dr Gladstone Lighter advice and to call in a few days to let us know how he's doing. . She verbalized understanding, appreciation.

## 2020-05-17 NOTE — Telephone Encounter (Signed)
Hold medication; may try again in a few days. -VRP

## 2020-05-18 ENCOUNTER — Telehealth: Payer: Self-pay | Admitting: Cardiovascular Disease

## 2020-05-18 DIAGNOSIS — R42 Dizziness and giddiness: Secondary | ICD-10-CM | POA: Diagnosis not present

## 2020-05-18 NOTE — Telephone Encounter (Signed)
Spoke with patient's wife about heart monitor. Explained that this was reviewed with Dr. Gwenlyn Found who advised that patient needs EP evaluation. Scheduled for OV with Dr. Quentin Ore on 12/21 @ 2:45pm. Advised wife I will cancel the 12/21 earlier appt with [facility]. Advised her on Dr. Mardene Speak office location. Explained if he has more syncopal episodes, she should call EMS/have him evaluated in hospital, given that the monitor has shown abnormal heart rhythm. She verbalized understanding.

## 2020-05-18 NOTE — Telephone Encounter (Signed)
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

## 2020-05-18 NOTE — Telephone Encounter (Signed)
    Vivien Rota with [facility] would like to report critical zio result

## 2020-05-22 ENCOUNTER — Telehealth: Payer: Self-pay | Admitting: Pharmacist

## 2020-05-22 ENCOUNTER — Ambulatory Visit: Payer: Medicare PPO | Admitting: Cardiology

## 2020-05-22 ENCOUNTER — Emergency Department (HOSPITAL_COMMUNITY): Payer: Medicare PPO

## 2020-05-22 ENCOUNTER — Other Ambulatory Visit: Payer: Self-pay

## 2020-05-22 ENCOUNTER — Ambulatory Visit: Payer: Medicare PPO | Admitting: Physician Assistant

## 2020-05-22 ENCOUNTER — Inpatient Hospital Stay (HOSPITAL_COMMUNITY)
Admission: EM | Admit: 2020-05-22 | Discharge: 2020-05-31 | DRG: 243 | Disposition: A | Discharge status: Home Health | Payer: Medicare PPO | Source: Ambulatory Visit | Attending: Family Medicine | Admitting: Family Medicine

## 2020-05-22 DIAGNOSIS — I442 Atrioventricular block, complete: Secondary | ICD-10-CM

## 2020-05-22 DIAGNOSIS — I471 Supraventricular tachycardia: Secondary | ICD-10-CM | POA: Diagnosis not present

## 2020-05-22 DIAGNOSIS — N400 Enlarged prostate without lower urinary tract symptoms: Secondary | ICD-10-CM | POA: Diagnosis present

## 2020-05-22 DIAGNOSIS — E876 Hypokalemia: Secondary | ICD-10-CM | POA: Diagnosis present

## 2020-05-22 DIAGNOSIS — D6959 Other secondary thrombocytopenia: Secondary | ICD-10-CM | POA: Diagnosis present

## 2020-05-22 DIAGNOSIS — Z87891 Personal history of nicotine dependence: Secondary | ICD-10-CM

## 2020-05-22 DIAGNOSIS — J9811 Atelectasis: Secondary | ICD-10-CM | POA: Diagnosis not present

## 2020-05-22 DIAGNOSIS — M19072 Primary osteoarthritis, left ankle and foot: Secondary | ICD-10-CM | POA: Diagnosis not present

## 2020-05-22 DIAGNOSIS — E854 Organ-limited amyloidosis: Secondary | ICD-10-CM | POA: Diagnosis present

## 2020-05-22 DIAGNOSIS — R55 Syncope and collapse: Secondary | ICD-10-CM | POA: Diagnosis not present

## 2020-05-22 DIAGNOSIS — H409 Unspecified glaucoma: Secondary | ICD-10-CM | POA: Diagnosis present

## 2020-05-22 DIAGNOSIS — Z950 Presence of cardiac pacemaker: Secondary | ICD-10-CM

## 2020-05-22 DIAGNOSIS — R4701 Aphasia: Secondary | ICD-10-CM | POA: Diagnosis present

## 2020-05-22 DIAGNOSIS — Z20822 Contact with and (suspected) exposure to covid-19: Secondary | ICD-10-CM | POA: Diagnosis present

## 2020-05-22 DIAGNOSIS — I680 Cerebral amyloid angiopathy: Secondary | ICD-10-CM | POA: Diagnosis not present

## 2020-05-22 DIAGNOSIS — M10072 Idiopathic gout, left ankle and foot: Secondary | ICD-10-CM | POA: Diagnosis present

## 2020-05-22 DIAGNOSIS — F0390 Unspecified dementia without behavioral disturbance: Secondary | ICD-10-CM | POA: Diagnosis present

## 2020-05-22 DIAGNOSIS — R569 Unspecified convulsions: Secondary | ICD-10-CM

## 2020-05-22 DIAGNOSIS — Z7901 Long term (current) use of anticoagulants: Secondary | ICD-10-CM

## 2020-05-22 DIAGNOSIS — I10 Essential (primary) hypertension: Secondary | ICD-10-CM | POA: Diagnosis present

## 2020-05-22 DIAGNOSIS — M19071 Primary osteoarthritis, right ankle and foot: Secondary | ICD-10-CM | POA: Diagnosis not present

## 2020-05-22 DIAGNOSIS — G40909 Epilepsy, unspecified, not intractable, without status epilepticus: Secondary | ICD-10-CM | POA: Diagnosis present

## 2020-05-22 DIAGNOSIS — I4892 Unspecified atrial flutter: Secondary | ICD-10-CM | POA: Diagnosis present

## 2020-05-22 DIAGNOSIS — I459 Conduction disorder, unspecified: Secondary | ICD-10-CM | POA: Diagnosis not present

## 2020-05-22 DIAGNOSIS — R52 Pain, unspecified: Secondary | ICD-10-CM

## 2020-05-22 DIAGNOSIS — R4789 Other speech disturbances: Secondary | ICD-10-CM

## 2020-05-22 DIAGNOSIS — Z79899 Other long term (current) drug therapy: Secondary | ICD-10-CM

## 2020-05-22 DIAGNOSIS — Z8673 Personal history of transient ischemic attack (TIA), and cerebral infarction without residual deficits: Secondary | ICD-10-CM

## 2020-05-22 DIAGNOSIS — M79672 Pain in left foot: Secondary | ICD-10-CM

## 2020-05-22 DIAGNOSIS — M7732 Calcaneal spur, left foot: Secondary | ICD-10-CM | POA: Diagnosis not present

## 2020-05-22 LAB — COMPREHENSIVE METABOLIC PANEL
ALT: 28 U/L (ref 0–44)
AST: 16 U/L (ref 15–41)
Albumin: 3.6 g/dL (ref 3.5–5.0)
Alkaline Phosphatase: 53 U/L (ref 38–126)
Anion gap: 12 (ref 5–15)
BUN: 21 mg/dL (ref 8–23)
CO2: 21 mmol/L — ABNORMAL LOW (ref 22–32)
Calcium: 9.5 mg/dL (ref 8.9–10.3)
Chloride: 108 mmol/L (ref 98–111)
Creatinine, Ser: 1.08 mg/dL (ref 0.61–1.24)
GFR, Estimated: >60 mL/min (ref >60)
Glucose, Bld: 97 mg/dL (ref 70–99)
Potassium: 3.6 mmol/L (ref 3.5–5.1)
Sodium: 141 mmol/L (ref 135–145)
Total Bilirubin: 0.7 mg/dL (ref 0.3–1.2)
Total Protein: 7.2 g/dL (ref 6.5–8.1)

## 2020-05-22 LAB — CBC WITH DIFFERENTIAL/PLATELET
Abs Immature Granulocytes: 0.04 10*3/uL (ref 0.00–0.07)
Basophils Absolute: 0 10*3/uL (ref 0.0–0.1)
Basophils Relative: 0 %
Eosinophils Absolute: 0.1 10*3/uL (ref 0.0–0.5)
Eosinophils Relative: 1 %
HCT: 52.9 % — ABNORMAL HIGH (ref 39.0–52.0)
Hemoglobin: 17.6 g/dL — ABNORMAL HIGH (ref 13.0–17.0)
Immature Granulocytes: 0 %
Lymphocytes Relative: 17 %
Lymphs Abs: 1.6 10*3/uL (ref 0.7–4.0)
MCH: 31.3 pg (ref 26.0–34.0)
MCHC: 33.3 g/dL (ref 30.0–36.0)
MCV: 94.1 fL (ref 80.0–100.0)
Monocytes Absolute: 0.5 10*3/uL (ref 0.1–1.0)
Monocytes Relative: 5 %
Neutro Abs: 6.9 10*3/uL (ref 1.7–7.7)
Neutrophils Relative %: 77 %
Platelets: 153 10*3/uL (ref 150–400)
RBC: 5.62 MIL/uL (ref 4.22–5.81)
RDW: 14.5 % (ref 11.5–15.5)
WBC: 9.2 10*3/uL (ref 4.0–10.5)
nRBC: 0 % (ref 0.0–0.2)

## 2020-05-22 LAB — PROTIME-INR
INR: 1.2 (ref 0.8–1.2)
Prothrombin Time: 14.6 seconds (ref 11.4–15.2)

## 2020-05-22 LAB — RESP PANEL BY RT-PCR (FLU A&B, COVID) ARPGX2
Influenza A by PCR: NEGATIVE
Influenza B by PCR: NEGATIVE
SARS Coronavirus 2 by RT PCR: NEGATIVE

## 2020-05-22 LAB — MAGNESIUM: Magnesium: 1.9 mg/dL (ref 1.7–2.4)

## 2020-05-22 LAB — TROPONIN I (HIGH SENSITIVITY): Troponin I (High Sensitivity): 26 ng/L — ABNORMAL HIGH (ref <18)

## 2020-05-22 MED ORDER — APIXABAN 5 MG PO TABS
5.0000 mg | ORAL_TABLET | Freq: Two times a day (BID) | ORAL | Status: DC
  Administered 2020-05-23 (×3): 5 mg via ORAL
  Filled 2020-05-22 (×3): qty 1

## 2020-05-22 MED ORDER — AMLODIPINE BESYLATE 5 MG PO TABS
5.0000 mg | ORAL_TABLET | Freq: Every day | ORAL | Status: DC
  Administered 2020-05-23: 5 mg via ORAL
  Filled 2020-05-22: qty 1

## 2020-05-22 MED ORDER — LATANOPROST 0.005 % OP SOLN
1.0000 [drp] | Freq: Every day | OPHTHALMIC | Status: DC
  Administered 2020-05-23 – 2020-05-30 (×8): 1 [drp] via OPHTHALMIC
  Filled 2020-05-22: qty 2.5

## 2020-05-22 MED ORDER — SODIUM CHLORIDE 0.9 % IV BOLUS
500.0000 mL | Freq: Once | INTRAVENOUS | Status: AC
  Administered 2020-05-22: 500 mL via INTRAVENOUS

## 2020-05-22 MED ORDER — MEMANTINE HCL 5 MG PO TABS
17.5000 mg | ORAL_TABLET | Freq: Every day | ORAL | Status: DC
  Administered 2020-05-23 – 2020-05-30 (×9): 17.5 mg via ORAL
  Filled 2020-05-22 (×12): qty 2

## 2020-05-22 MED ORDER — DUTASTERIDE 0.5 MG PO CAPS
0.5000 mg | ORAL_CAPSULE | Freq: Every day | ORAL | Status: DC
  Administered 2020-05-23 – 2020-05-31 (×9): 0.5 mg via ORAL
  Filled 2020-05-22 (×9): qty 1

## 2020-05-22 MED ORDER — POLYETHYLENE GLYCOL 3350 17 G PO PACK
17.0000 g | PACK | Freq: Every day | ORAL | Status: DC | PRN
  Administered 2020-05-31: 17 g via ORAL
  Filled 2020-05-22: qty 1

## 2020-05-22 MED ORDER — ACETAMINOPHEN 325 MG PO TABS: 650.0000 mg | ORAL_TABLET | ORAL | Status: DC | PRN

## 2020-05-22 MED ORDER — ACETAMINOPHEN 650 MG RE SUPP: 650.0000 mg | RECTAL | Status: DC | PRN

## 2020-05-22 MED ORDER — TAMSULOSIN HCL 0.4 MG PO CAPS
0.4000 mg | ORAL_CAPSULE | Freq: Every day | ORAL | Status: DC
  Administered 2020-05-23: 0.4 mg via ORAL
  Filled 2020-05-22: qty 1

## 2020-05-22 MED ORDER — SODIUM CHLORIDE 0.9 % IV SOLN
75.0000 mL/h | INTRAVENOUS | Status: DC
  Administered 2020-05-23: 75 mL/h via INTRAVENOUS

## 2020-05-22 MED ORDER — BRIMONIDINE TARTRATE 0.2 % OP SOLN
1.0000 [drp] | Freq: Two times a day (BID) | OPHTHALMIC | Status: DC
  Administered 2020-05-23 – 2020-05-31 (×16): 1 [drp] via OPHTHALMIC
  Filled 2020-05-22 (×2): qty 5

## 2020-05-22 NOTE — Progress Notes (Signed)
vLTM started following baseline EEG.   pt in ER   Same leads used.   Neurology notified

## 2020-05-22 NOTE — ED Triage Notes (Signed)
Pt arrives to ED BIB [facility] for a Syncopal Episode. Per EMS pt was at his cardiology   appointment for a pacemaker and experienced a syncopal episode after HR going from 130 to 60 with a "8 second pause". Pt does not remember the event. Hx of same and HTN. Pt is A/O x4  BP 60 Palpated O2 94%  RA CBG 91

## 2020-05-22 NOTE — Progress Notes (Signed)
EEG complete - results pending 

## 2020-05-22 NOTE — H&P (Addendum)
[facility] Admission History and Physical Service Pager: [phone]  Patient name: Dustin Hamilton Medical record number: [id number] Date of birth: 03-25-1941 Age: 79 y.o. Gender: male  Primary Care Provider: [facility] Consultants: Neurology, Cardiology Code Status:  Full  Preferred Emergency Contact: Iliya Josephson (wife) [phone]  Chief Complaint: Syncope  Assessment and Plan: Dustin Hamilton is a 79 y.o. male presenting by EMS s/p seizure-like epsiode. PMH is significant for CVA, HTN, pSVT with AVNRT ablation in 11/2018, Mobitz Type 1 AV block, dizziness.   Seizure: acute, resolved Patient with a history of seizures and followed by neurologist, Dr. [name].  Patient was recently admitted on 12/02 for similar symptoms.  EEG at that time significant for nonspecific cortical dysfunction in the right temporal region without seizures or epileptiform discharges.  Patient recently followed up with neurology on 12/13 where he had a witnessed possible complex partial seizure in the office.  Was started on levetiracetam ER 500mg  nightly, but patient only reports taking this only once before stopping.  States that he felt more dizzy on this medication and was unable to tolerate it. Today, while at his cardiology appointment patient had another episode of what appeared to be a seizure, per his wife.  Patient's eyes were closed and his head was slumped.  The episode lasted a few minutes and patient was slightly confused afterwards. Denies any tremors, incontinence.  Per wife these episodes happen frequently, about 1-2 times a day.  Denies fall, injury.  Patient now asymptomatic stating "I am ready to go home".  Neuro exam benign. ED provider consulted both neurology and cardiology.  Believe that most likely etiology is seizure due to the frequency and description of these episodes.  Patient does have a cardiac history of arrhythmias and recently had a  2-week Holter monitor, results below.  Cannot rule out arrhythmia as a potential cause as well, though patient denies any chest pain, palpitations, syncope or shortness of breath.. - Admit for observation med-tele, attending Dr. Andria Frames  - Continuous cardiac monitoring, pulse ox - Orthostatic vital signs when able to get up (after EEG) - AM CBC, BMP - Continuous video EEG - Neurology consulted in ED, appreciate recommendations - Cardiology consulted in ED, appreciate recommendations - Vitals per floor protocol  - PT/OT eval and treat  - Seizure precautions/Fall precautions  Right Ankle Pain: acute, stable Patient notes right ankle pain that began about 3 days ago.  Denies any injury, trauma to the area.  Describes it as a constant "hurting" pain that varies in severity.  Full range of motion, 5 out of 5 strength.  Ankle x-ray in ED negative for fracture. Appears slightly edematous. Pain on palpation to area lateral to Achilles tendon and posterior to lateral malleolus. Most likely differential includes anterior talofibular sprain. No hx of gout, no hx DM to suggest charcot joint. 2+ DP pulses b/l and extremities warm and dry so doubt vascular cause.  - PT eval and treat  Atrial Flutter  On Eliquis 5 mg BID. Echo 12/03, with EF 0000000, grade 1 diastolic dysfunction but no regional wall abnormalities.   - Continue home Eliquis   Elevated Troponin: acute, stable Elevated to 26. Denies chest pain, SOB. EKG sinus rhythm with prolonged PR, stable compared to previous.  - Cardiac monitoring - F/u Serial Trop's   HTN: chronic, stable On Amlodipine 5 mg. BP elevated while in ED with the highest pressure being 200/116. Now improved to 123456 systolic. Per chart review  it appears patient was recommended to increase to 10 mg but has not. Would likely benefit from additional anti-HTN medications.  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 Recent Holter monitor with SR/SB/ST, occasional PAC's, PVC's, short runs of SVT, CHB with up to 8-second pauses. Unclear if the referral to EP was made.  - Cardiology consulted, appreciate recommendations  - Cardiac monitoring as above  Hx Polycythemia  Hx of Thrombocytopenia  Polycythemic at 17.6. Most recent baseline appears to be 15-16. Per chart review, patient with history of polycythemia as far back as 2008 and as high as 18.7 in 2017. Hx of thrombocytopenia on prior admission. Platelets on admission 153- seems to have resolved. - Monitor with daily CBC  - Consider serum EPO vs outpatient work up  BPH: chronic, stable On Dutasteride 0.5 mg daily and Tamsulosin 0.4 mg daily. Patient with positive orthostatics on previous admission. Pending repeat orthostatics might need to consider d/c one of his home medications.  - Continue Dutasteride 0.5 mg every morning - Continue Tamsulosin 0.4 mg nightly  History of CVA in 2014 Followed by Neurologist, Dr. [name]. Previously on Plavix and ASA but per wife, was recently transitioned to just Eliquis.  - PT/OT eval and treat - Continue Eliquis 5 mg BID  Glaucoma: chronic, stable Vision difficulties in left eye.  - Continue Brimonidine 0.2% ophthalmic solution 1 drop both eyes BID  - Continue Latanoprost 0.005% ophthalmic solution, 1 drop both eyes QHS   Mild dementia: chronic, stable Follows with Neurology, as above. On Namenda 17.5mg  QHS  - Continue Namenda nightly   FEN/GI: Regular diet Prophylaxis: Eliquis  Disposition: Med-tele  History of Present Illness:  Dustin Hamilton is a 79 y.o. male presenting s/p seizure-like episode.  Patient states that he had the same spell as last time.  Couldn't say much. Episode started around  230pm at his cardiologist's office. Patient was sitting  and started getting hot.  States he tried to cool off and then zoned out. Staff took his blood pressure, which was low, and his heart was racing.  EKG done in office.  Had a recent 2-week Holter monitor. Was also seen by his Neurologist recently and started on Ool[place]. Unfortunately, he was unable to tolerate the medication due to increased dizziness and stopped after one dose.  His wife states that since then he has had an increase in these seizure-like episodes where he spaces out.  They typically last 2-3 mins.  Patient is confused at the beginning for about 5 min after the episode. No incontinence of bowel or bladder. Episodes happening more frequently, daily sometimes twice a day. No recent or history of falls.  No body movement or rolling of eyes. Patient now feels back to his normal baseline.   Patient also notes right ankle pain that started about 3-4 days ago.  No recent trauma or injury to the area. It is a constant "hurting" pain but varies in severity. He is still able to walk on it sometimes. Patient has similar pain in his right knee, from arthritis. Was recently on Prednisone for this, finished the course yesterday. States that the prednisone helped with the pain.  Ankle x-ray in ED negative for acute fracture  ED Course: Given 500  cc bolus. Vitals stable. Cardiology and Neurology consulted. EKG normal sinus rhythm with prolonged PR.  Review Of Systems: Per HPI with the following additions:   Review of Systems  Constitutional: Negative for fever.  Respiratory: Negative for shortness of breath.   Cardiovascular: Negative for chest pain, palpitations and leg swelling.  Gastrointestinal: Negative for abdominal pain, blood in stool, constipation, diarrhea, nausea and vomiting.  Genitourinary: Negative for dysuria.  Musculoskeletal: Positive for arthralgias.  Neurological: Positive for dizziness, seizures, syncope and numbness.  Negative for tremors, speech difficulty and headaches.  Psychiatric/Behavioral: Confusion:       Patient Active Problem List   Diagnosis Date Noted  . Syncope 05/03/2020  . History of stroke 05/03/2020  . History of supraventricular tachycardia 05/03/2020  . Thrombocytopenia ([place]) 05/03/2020  . Acute kidney injury ([place]) 05/03/2020  . Memory impairment 05/03/2020  . Atypical chest pain 01/27/2020  . SVT (supraventricular tachycardia) ([place]) 06/03/2017  . Dizziness 08/02/2014  . Stroke, thrombotic ([place]) 12/27/2012  . CVA (cerebral infarction) 09/13/2012  . Vertigo 09/12/2012  . EAR PAIN 09/05/2009  . BACK PAIN 06/20/2009  . COLONIC POLYPS 05/06/2007  . 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 NONDEPENDENT ALCOHOL ABUSE IN REMISSION 05/06/2007  . HYPERTENSION, SEVERE 05/06/2007  . Hypertensive heart disease without heart failure 05/06/2007  . INTRACRANIAL HEMORRHAGE 05/06/2007  . Cerebrovascular disease 05/06/2007  . HEMORRHOIDS 05/06/2007  . INGUINAL HERNIA, RIGHT 05/06/2007  . RENAL CYST 05/06/2007  . HEMATURIA UNSPECIFIED 05/06/2007  . 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 PSA, INCREASED 05/06/2007  . LIVER FUNCTION TESTS, ABNORMAL 05/06/2007  . BPH (benign prostatic hypertrophy) with urinary obstruction 05/06/2007    Past Medical History: Past Medical History:  Diagnosis Date  . BPH (benign prostatic hyperplasia)   . Cerebrovascular disease   . Congenital anomaly of diaphragm   . Elevated PSA   . Glaucoma, both eyes   . Hemorrhoid   . Hepatitis B surface antigen positive    02-20-2011  . History of adenomatous polyp of colon    2007, 2009 and 2013  tubular adenoma's  . History of alcohol abuse    quit 1963  . History of cerebral parenchymal hemorrhage    01/ 2006  left occiptial lobe related to hypertensive crisis  . History of CVA (cerebrovascular accident)    09-12-2012  left hippocampus/ amygdala junction and per MRI old white matter infarcts--   per pt residual short- term memory issues  . History of fatty infiltration of liver hx visit's at [facility] , last visit 05/ 2014   elvated LFT's ,  via liver bx 2004 related to hx alcohol and drug abuse (quit 1964)  . 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 HTN (hypertension)   . Renal cyst, left   . Stroke ([place])    hx of 3 strokes in past   . 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 Urethral lesion    urethral mass    Past Surgical History: Past Surgical History:  Procedure Laterality Date  . CARDIOVASCULAR STRESS TEST  05/05/2007   normal nuclear study w/ no ischemia/  normal LV fucntion and wall motion , ef60%  . COLONOSCOPY  last one 04-06-2012  . CYSTO/  LEFT RETROGRADE PYELOGRAM/ CYTOLOGY WASHINGS/  URETEROSCOPY  03/05/2000  . INGUINAL HERNIA REPAIR Bilateral 1965 and 1980's  . LAPAROSCOPIC INGUINAL HERNIA WITH UMBILICAL HERNIA Right [id number]  . LIVER BIOPSY  1980's and 2004  . SVT ABLATION N/A 11/15/2018   Procedure: SVT ABLATION;  Surgeon: Evans Lance, MD;  Location: [facility];  Service: Cardiovascular;  Laterality: N/A;  . TRANSTHORACIC ECHOCARDIOGRAM  09/13/2012   moderate LVH,  ef 60-65%/    . TRANSURETHRAL RESECTION OF BLADDER TUMOR N/A 08/11/2016   Procedure: TRANSURETHRAL RESECTION OF BLADDER TUMOR (TURBT);  Surgeon: Cleon Gustin, MD;  Location[facility];  Service: Urology;  Laterality: N/A;    Social History: Social History   Tobacco Use  . Smoking status: Former Smoker    Packs/day: 1.00    Years: 5.00    Pack years: 5.00    Types: Cigarettes    Quit date: 11/30/1981    Years since quitting: 38.5  . Smokeless tobacco: Never Used  Vaping Use  . Vaping Use: Never used  Substance Use Topics  . Alcohol use: No    Alcohol/week: 0.0 standard drinks    Comment: hx abuse -- quit:  1963  . Drug use: No    Comment: hx abuse -- quit 1964 (iv heroin and cocaine    Additional social history:   Please also refer to relevant sections of EMR.  Family History: Family History  Problem Relation Age of Onset  . Rheum arthritis Mother   . Diabetes Mother   . Stroke Mother   . Heart attack Mother   . Kidney failure Mother   . Heart attack Father   . Heart disease Maternal Grandmother   . Rheum arthritis Maternal Grandmother   . Diabetes Maternal Grandmother   . Stroke Maternal Grandmother   . Colon cancer Neg Hx    Allergies and Medications: Allergies  Allergen Reactions  . Penicillins Hives   No current facility-administered medications on file prior to encounter.   Current Outpatient Medications on File Prior to Encounter  Medication Sig Dispense Refill  . amLODipine (NORVASC) 5 MG tablet Take 5 mg by mouth daily.    Marland Kitchen apixaban (ELIQUIS) 5 MG TABS tablet Take 1 tablet (5 mg total) by mouth 2 (two) times daily. 60 tablet 0  . brimonidine (ALPHAGAN) 0.2 % ophthalmic solution Place 1 drop into both eyes 2 (two) times daily.    Marland Kitchen dutasteride (AVODART) 0.5 MG capsule Take 0.5 mg by mouth daily.    Marland Kitchen latanoprost (XALATAN) 0.005 % ophthalmic solution Place 1 drop into both eyes at bedtime.     . meclizine (ANTIVERT) 25 MG tablet Take 25 mg by mouth 3 (three) times daily as needed for dizziness.    . memantine (NAMENDA) 5 MG tablet Take 17.5 mg by mouth at bedtime.     . predniSONE (DELTASONE) 20 MG tablet Take 20 mg by mouth. 05/14/20 one a day x 4 more days    . tamsulosin (FLOMAX) 0.4 MG CAPS capsule Take 0.4 mg by mouth at bedtime.     . levETIRAcetam (KEPPRA XR) 500 MG 24 hr tablet Take 1 tablet (500 mg total) by mouth every evening. (Patient not taking: No sig reported) 30 tablet 12    Objective: BP (!) 143/93   Pulse 79   Temp 98.7 F (37.1 C) (Oral)   Resp 20   Ht 5\' 11"  (1.803 m)   Wt 77.1 kg   SpO2 98%   BMI 23.71 kg/m  Exam: General: AxO x 4, pleasant, in no acute distress Eyes: EOMI ENTM: No rhinorrhea, MMM, no facial droop   Neck: supple Cardiovascular: RRR, without murmur Respiratory: CTAB  without wheezing/rhonchi/rales Gastrointestinal: Normoactive bowel sounds, non-tender in all quadrants, no R/G, no hepatomegaly  MSK: 5/5 upper and lower extremity strength b/l Derm: warm and dry Neuro: AxO x4, no focal deficits, CN 2-12 grossly intact, speaking in full sentences without slurring, 5/5 muscle strength upper and lower extremities   Psych: Mood appropriate, normal insight   Labs and Imaging: CBC BMET  No results for input(s): WBC, HGB, HCT, PLT in the last 168 hours. No results for input(s): NA, K, CL, CO2, BUN, CREATININE, GLUCOSE, CALCIUM in the last 168 hours.   EKG: Sinus rhythm 98, first degree AV block  Ankle 2-View 05/22/20 IMPRESSION: No acute displaced fracture or dislocation of the right ankle on this two view radiograph.  Chest X-ray 05/22/20 IMPRESSION: No active disease.  Sharion Settler, DO 05/22/2020, 7:53 PM PGY-1, [place] [facility] pager: [phone], text pages welcome   Carollee Leitz, MD PGY2 [facility] Res[facility]

## 2020-05-22 NOTE — Telephone Encounter (Signed)
Patient presented to [facility] [address] accompanied by his wife for his appointment with Dr. Quentin Ore. While checking in, patient became hot, pale and relatively unresponsive. CODE BLUE was called. At 1451 code team and Dr. Quentin Ore arrived to lobby. He was taken directly back to pod B (pt was already sitting in his rolling walker). At 1454 he was hooked up to a 12 lead EKG. EKG was reviewed by Dr. Quentin Ore. Dr. Quentin Ore spoke to patients wife. 1457 blood glucose was checked- 91. 1459 blood pressure was 64 systolic. No diastolic heard. 1502 Dr. Quentin Ore spoke to patient. [id number] EMS called. 1503 patient was transferred to a reclining chair. 1505 BP was 150/80 then 139/90 HR 108 via automatic machine. 1509 patient was much more coherent. [id number] EMS arrived and report was given. 1517 EMG got an EKG. 1519 BP by EMS was 124/70 104 HR. 1525 patient was transported to [facility] by EMS.

## 2020-05-22 NOTE — ED Provider Notes (Signed)
[facility] EMERGENCY DEPARTMENT Provider Note   CSN: [id number] Arrival date & time: 05/22/20  1552     History Chief Complaint  Patient presents with  . Near Syncope  . Loss of Consciousness    Dustin Hamilton is a 79 y.o. male.  HPI   79 year old male with past medical history of HTN, previous CVA, syncopal episodes that he is being evaluated by cardiology/EP and neurology for presents the emergency department for syncopal episode.  Patient was at the cardiologist office about to see Dr. Quentin Ore when he felt like his heart was racing, his family member in the room states that he said he felt very warm like he was going to pass out.  They were able to sit him down and they report a brief period of "unresponsiveness".  A rapid response was called, the cardiologist was able to evaluate the patient, it is reported that he had palpable pulses.  In the past patient has had episodes of atrial flutter, SVT and is status post ablation, he is also history of a second-degree block, he is anticoagulated on Eliquis and was supposedly being evaluated for pacemaker at this visit.  He also has a questionable EEG for possible epilepsy, was originally prescribed Keppra however did not like the way that this medication made him feel so he consulted with his neurologist and has discontinued that medication.  Otherwise there is been no recent illness in the past couple days.  Denies any fever.  At this time he has no chest pain or shortness of breath.  Past Medical History:  Diagnosis Date  . BPH (benign prostatic hyperplasia)   . Cerebrovascular disease   . Congenital anomaly of diaphragm   . Elevated PSA   . Glaucoma, both eyes   . Hemorrhoid   . Hepatitis B surface antigen positive    02-20-2011  . History of adenomatous polyp of colon    2007, 2009 and 2013  tubular adenoma's  . History of alcohol abuse    quit 1963  . History of cerebral parenchymal hemorrhage    01/ 2006   left occiptial lobe related to hypertensive crisis  . History of CVA (cerebrovascular accident)    09-12-2012  left hippocampus/ amygdala junction and per MRI old white matter infarcts--  per pt residual short- term memory issues  . History of fatty infiltration of liver hx visit's at [facility] , last visit 05/ 2014   elvated LFT's ,  via liver bx 2004 related to hx alcohol and drug abuse (quit 1964)  . 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 HTN (hypertension)   . Renal cyst, left   . Stroke ([place])    hx of 3 strokes in past   . 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 Urethral lesion    urethral mass    Patient Active Problem List   Diagnosis Date Noted  . Syncope 05/03/2020  . History of stroke 05/03/2020  . History of supraventricular tachycardia 05/03/2020  . Thrombocytopenia ([place]) 05/03/2020  . Acute kidney injury ([place]) 05/03/2020  . Memory impairment 05/03/2020  . Atypical chest pain 01/27/2020  . SVT (supraventricular tachycardia) ([place]) 06/03/2017  . Dizziness 08/02/2014  . 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 CVA (cerebral infarction) 09/13/2012  . Vertigo 09/12/2012  . EAR PAIN 09/05/2009  . BACK PAIN 06/20/2009  . COLONIC POLYPS 05/06/2007  . 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 NONDEPENDENT ALCOHOL ABUSE IN REMISSION 05/06/2007  . HYPERTENSION, SEVERE 05/06/2007  . Hypertensive heart disease without heart failure 05/06/2007  . INTRACRANIAL HEMORRHAGE 05/06/2007  . Cerebrovascular disease 05/06/2007  . HEMORRHOIDS 05/06/2007  . INGUINAL HERNIA, RIGHT 05/06/2007  . RENAL CYST 05/06/2007  . HEMATURIA UNSPECIFIED 05/06/2007  . 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 PSA, INCREASED 05/06/2007  . LIVER FUNCTION TESTS, ABNORMAL 05/06/2007  . BPH (benign prostatic hypertrophy) with urinary obstruction 05/06/2007    Past Surgical History:  Procedure Laterality Date  .  CARDIOVASCULAR STRESS TEST  05/05/2007   normal nuclear study w/ no ischemia/  normal LV fucntion and wall motion , ef60%  . COLONOSCOPY  last one 04-06-2012  . CYSTO/  LEFT RETROGRADE PYELOGRAM/ CYTOLOGY WASHINGS/  URETEROSCOPY  03/05/2000  . INGUINAL HERNIA REPAIR Bilateral 1965 and 1980's  . LAPAROSCOPIC INGUINAL HERNIA WITH UMBILICAL HERNIA Right [id number]  . LIVER BIOPSY  1980's and 2004  . SVT ABLATION N/A 11/15/2018   Procedure: SVT ABLATION;  Surgeon: Evans Lance, MD;  Location: [facility];  Service: Cardiovascular;  Laterality: N/A;  . TRANSTHORACIC ECHOCARDIOGRAM  09/13/2012   moderate LVH,  ef 60-65%/    . TRANSURETHRAL RESECTION OF BLADDER TUMOR N/A 08/11/2016   Procedure: TRANSURETHRAL RESECTION OF BLADDER TUMOR (TURBT);  Surgeon: Cleon Gustin, MD;  Location: [facility];  Service: Urology;  Laterality: N/A;       Family History  Problem Relation Age of Onset  . Rheum arthritis Mother   . Diabetes Mother   . Stroke Mother   . Heart attack Mother   . Kidney failure Mother   . Heart attack Father   . Heart disease Maternal Grandmother   . Rheum arthritis Maternal Grandmother   . Diabetes Maternal Grandmother   . Stroke Maternal Grandmother   . Colon cancer Neg Hx     Social History   Tobacco Use  . Smoking status: Former Smoker    Packs/day: 1.00    Years: 5.00    Pack years: 5.00    Types: Cigarettes    Quit date: 11/30/1981    Years since quitting: 38.5  . Smokeless tobacco: Never Used  Vaping Use  . Vaping Use: Never used  Substance Use Topics  . Alcohol use: No    Alcohol/week: 0.0 standard drinks    Comment: hx abuse -- quit:  1963  . Drug use: No    Comment: hx abuse -- quit 1964 (iv heroin and cocaine    Home Medications Prior to Admission medications   Medication Sig Start Date End Date Taking? Authorizing Provider  amLODipine (NORVASC) 5 MG tablet Take 5 mg by mouth daily.   Yes [provider]   apixaban (ELIQUIS) 5 MG TABS tablet Take 1 tablet (5 mg total) by mouth 2 (two) times daily. 05/06/20 06/05/20 Yes Lattie Haw, MD  brimonidine (ALPHAGAN) 0.2 % ophthalmic solution Place 1 drop into both eyes 2 (two) times daily.   Yes [provider]  dutasteride (AVODART) 0.5 MG capsule Take 0.5 mg by mouth daily.   Yes [provider]  latanoprost (XALATAN) 0.005 % ophthalmic solution Place 1 drop into both eyes at bedtime.    Yes [provider]  meclizine (ANTIVERT) 25 MG tablet Take 25 mg by mouth 3 (three) times daily as needed for dizziness.   Yes [provider]  memantine (NAMENDA) 5 MG tablet Take 17.5 mg by mouth at bedtime.  Yes [provider]  predniSONE (DELTASONE) 20 MG tablet Take 20 mg by mouth. 05/14/20 one a day x 4 more days 05/08/20  Yes [provider]  tamsulosin (FLOMAX) 0.4 MG CAPS capsule Take 0.4 mg by mouth at bedtime.    Yes [provider]  levETIRAcetam (KEPPRA XR) 500 MG 24 hr tablet Take 1 tablet (500 mg total) by mouth every evening. Patient not taking: No sig reported 05/14/20   Penumalli, Earlean Polka, MD    Allergies    Penicillins  Review of Systems   Review of Systems  Constitutional: Positive for fatigue. Negative for chills and fever.  HENT: Negative for congestion.   Eyes: Negative for visual disturbance.  Respiratory: Negative for shortness of breath.   Cardiovascular: Positive for palpitations. Negative for chest pain.  Gastrointestinal: Negative for abdominal pain, diarrhea and vomiting.  Genitourinary: Negative for dysuria.  Skin: Negative for rash.  Neurological: Positive for syncope. Negative for headaches.    Physical Exam Updated Vital Signs BP (!) 156/98   Pulse 88   Temp 98.7 F (37.1 C) (Oral)   Resp 19   Ht 5\' 11"  (1.803 m)   Wt 77.1 kg   SpO2 98%   BMI 23.71 kg/m   Physical Exam Vitals and nursing note reviewed.  Constitutional:      Appearance: Normal  appearance.  HENT:     Head: Normocephalic.     Mouth/Throat:     Mouth: Mucous membranes are moist.  Eyes:     Pupils: Pupils are equal, round, and reactive to light.  Cardiovascular:     Rate and Rhythm: Normal rate.  Pulmonary:     Effort: Pulmonary effort is normal. No respiratory distress.     Breath sounds: Normal breath sounds.  Abdominal:     Palpations: Abdomen is soft.     Tenderness: There is no abdominal tenderness.  Musculoskeletal:     Comments: TTP of lateral malleolus of right ankle, no obvious deformity   Skin:    General: Skin is warm.  Neurological:     Mental Status: He is alert and oriented to person, place, and time. Mental status is at baseline.  Psychiatric:        Mood and Affect: Mood normal.     ED Results / Procedures / Treatments   Labs (all labs ordered are listed, but only abnormal results are displayed) Labs Reviewed  RESP PANEL BY RT-PCR (FLU A&B, COVID) [id number]  CBC WITH DIFFERENTIAL/PLATELET  COMPREHENSIVE METABOLIC PANEL  PROTIME-INR  MAGNESIUM  TROPONIN I (HIGH SENSITIVITY)    EKG EKG Interpretation  Date/Time:  Tuesday May 22 2020 15:58:18 EST Ventricular Rate:  98 PR Interval:    QRS Duration: 112 QT Interval:  326 QTC Calculation: 417 R Axis:   -74 Text Interpretation: Sinus rhythm Prolonged PR interval Left anterior fascicular block Anterior infarct, old Artifact in lead(s) I III aVL NSR, first degree AV block unchanged Confirmed by Lavenia Atlas [phone]) on 05/22/2020 5:15:53 PM   Radiology DG Chest 1 View  Result Date: 05/22/2020 CLINICAL DATA:  Syncope EXAM: CHEST  1 VIEW COMPARISON:  May 03, 2020 FINDINGS: The heart size and mediastinal contours are within normal limits. Both lungs are clear. The visualized skeletal structures are unremarkable. There is stable elevation of the right hemidiaphragm. IMPRESSION: No active disease. Electronically Signed   By: Constance Holster M.D.   On: 05/22/2020 18:08     Procedures Procedures (including critical care time)  Medications Ordered in  ED Medications  sodium chloride 0.9 % bolus 500 mL (has no administration in time range)    ED Course  I have reviewed the triage vital signs and the nursing notes.  Pertinent labs & imaging results that were available during my care of the patient were reviewed by me and considered in my medical decision making (see chart for details).  Clinical Course as of 05/22/20 1957  Tue May 22, 2020  1824 EKG shows sinus rhythm, prolonged PR but no dropped p waves or signs of a heart block. [KH]    Clinical Course User Index [KH] Raif Chachere, Alvin Critchley, DO   MDM Rules/Calculators/A&P                          79 year old male presents the emergency department after a syncopal episode.  He is in the process of being evaluated by cardiology for possible pacemaker and neurology for possible epilepsy.  Plan per EMS and patient was admission for further evaluation.  At this time patient has no chest pain, shortness of breath or active symptoms, vitals are normal.  Cardiology has been made aware of the patient's presence. Neurology is also aware and will review the records to give further recommendations. Patient will be admitted to the hospitalist service for continued care, he is stable at time of admission with no active symptoms.  Final Clinical Impression(s) / ED Diagnoses Final diagnoses:  None    Rx / DC Orders ED Discharge Orders    None       Lorelle Gibbs, DO 05/22/20 1958

## 2020-05-22 NOTE — Consult Note (Addendum)
NEUROLOGY CONSULTATION NOTE   Date of service: May 22, 2020 Patient Name: Dustin Hamilton MRN:  [id number] DOB:  1941/05/15 Reason for consult: "Seizure vs syncope" _ _ _   _ __   _ __ _ _  __ __   _ __   __ _  History of Present Illness  Dustin Hamilton is a 79 y.o. male with PMH significant for prior stroke in 2014, hx of SVT s/p ablation 6/20, hx of AV block, mild dementia who has been evaluated by [place] in the past for spells of expressive aphasia, no LOC. Has had recent workup with rEEG and with MRI Brain without contrast. 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 He was seen by Dr. [name] in clinic on 05/14/20 and had a witnessed event. Per notes, the event was described as "During our evaluation patient had a witnessed event; he said "I feel it coming on", then he leaned over, said "oh no; oh no", grabbed his walker handled, breathed heavily for 1-2 minutes, could squeeze my hand on command, but not could not say his name initially, but then could speak later on. Patient was amnestic to the spell. This is similar to events witnessed by wife at home (~3-4 per month; zone out; not able to speak)."  He was started on Keppra ER 500mg  once daily but it was stopped as it made him sick and dizzy and not able to function. The plan per Dr. [name] was to hold Keppra and to try again in a few days.  Patient comes today with an event at cardiologist's office. Patient reports that he was in the waiting room, talking to registration. Out of nowhere, he leaned forward a little and this is what tells him that the event is coming. He told his wife that "I feel it coming", he was unable to talk during the event. He was aware of the event the entire time. His eyes were closed, he did not feel lightheaded or vertigo. He was able to understand what  people were saying but is unable to express anything. He was warm towards the end of the event. During the event, wife reports that he was able to squeeze cardiologist's hand. He typically has about 2 events per day. He had a shorter event earlier before leaving for cardiology appointment. No post ictal somnolence or confusion.    ROS   Constitutional Denies weight loss, fever and chills.   HEENT Denies changes in vision and hearing.   Respiratory Denies SOB and cough.   CV Denies palpitations and CP   GI Denies abdominal pain, nausea, vomiting and diarrhea.   GU Denies dysuria and urinary frequency.   MSK Denies myalgia and joint pain.   Skin Denies rash and pruritus.   Neurological Denies headache and syncope.   Psychiatric Denies recent changes in mood. Denies anxiety and depression.    Past History   Past Medical History:  Diagnosis Date  . BPH (benign prostatic hyperplasia)   . Cerebrovascular disease   . Congenital anomaly of diaphragm   . Elevated PSA   . Glaucoma, both eyes   . Hemorrhoid   . Hepatitis B surface antigen positive    02-20-2011  . History of adenomatous polyp of colon    2007, 2009 and 2013  tubular adenoma's  . History of alcohol abuse  quit 1963  . History of cerebral parenchymal hemorrhage    01/ 2006  left occiptial lobe related to hypertensive crisis  . History of CVA (cerebrovascular accident)    09-12-2012  left hippocampus/ amygdala junction and per MRI old white matter infarcts--  per pt residual short- term memory issues  . History of fatty infiltration of liver hx visit's at [facility] , last visit 05/ 2014   elvated LFT's ,  via liver bx 2004 related to hx alcohol and drug abuse (quit 1964)  . 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 HTN (hypertension)   . Renal cyst, left   . Stroke ([place])    hx of 3 strokes in past   . 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 Urethral  lesion    urethral mass   Past Surgical History:  Procedure Laterality Date  . CARDIOVASCULAR STRESS TEST  05/05/2007   normal nuclear study w/ no ischemia/  normal LV fucntion and wall motion , ef60%  . COLONOSCOPY  last one 04-06-2012  . CYSTO/  LEFT RETROGRADE PYELOGRAM/ CYTOLOGY WASHINGS/  URETEROSCOPY  03/05/2000  . INGUINAL HERNIA REPAIR Bilateral 1965 and 1980's  . LAPAROSCOPIC INGUINAL HERNIA WITH UMBILICAL HERNIA Right 45/80/9983  . LIVER BIOPSY  1980's and 2004  . SVT ABLATION N/A 11/15/2018   Procedure: SVT ABLATION;  Surgeon: Evans Lance, MD;  Location: [facility];  Service: Cardiovascular;  Laterality: N/A;  . TRANSTHORACIC ECHOCARDIOGRAM  09/13/2012   moderate LVH,  ef 60-65%/    . TRANSURETHRAL RESECTION OF BLADDER TUMOR N/A 08/11/2016   Procedure: TRANSURETHRAL RESECTION OF BLADDER TUMOR (TURBT);  Surgeon: Cleon Gustin, MD;  Location[facility];  Service: Urology;  Laterality: N/A;   Family History  Problem Relation Age of Onset  . Rheum arthritis Mother   . Diabetes Mother   . Stroke Mother   . Heart attack Mother   . Kidney failure Mother   . Heart attack Father   . Heart disease Maternal Grandmother   . Rheum arthritis Maternal Grandmother   . Diabetes Maternal Grandmother   . Stroke Maternal Grandmother   . Colon cancer Neg Hx    Social History   Socioeconomic History  . Marital status: Married    Spouse name: [name]  . Number of children: 1  . Years of education: College  . Highest education level: Not on file  Occupational History  . Occupation: Geophysicist/field seismologist  Tobacco Use  . Smoking status: Former Smoker    Packs/day: 1.00    Years: 5.00    Pack years: 5.00    Types: Cigarettes    Quit date: 11/30/1981    Years since quitting: 38.5  . Smokeless tobacco: Never Used  Vaping Use  . Vaping Use: Never used  Substance and Sexual Activity  . Alcohol use: No    Alcohol/week: 0.0 standard drinks    Comment: hx abuse --  quit:  1963  . 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 Sexual activity: Not on file  Other Topics Concern  . Not on file  Social History Narrative   Patient lives at home with his spouse.   Caffeine Use:  Tea, lots   Social Determinants of Health   Financial Resource Strain: Not on file  Food Insecurity: Not on file  Transportation  Needs: Not on file  Physical Activity: Not on file  Stress: Not on file  Social Connections: Not on file   Allergies  Allergen Reactions  . Penicillins Hives    Medications  (Not in a hospital admission)    Vitals   Vitals:   05/22/20 1645 05/22/20 1700 05/22/20 1715 05/22/20 1730  BP: (!) 200/116 (!) 155/102 (!) 144/101 (!) 156/98  Pulse: (!) 107 84 80 88  Resp: 17 17 15 19   Temp:      TempSrc:      SpO2: 97% 97% 97% 98%  Weight:      Height:         Body mass index is 23.71 kg/m.  Physical Exam   General: Laying comfortably in bed; in no acute distress.  HENT: Normal oropharynx and mucosa. Normal external appearance of ears and nose.  Neck: Supple, no pain or tenderness  CV: No JVD. No peripheral edema.  Pulmonary: Symmetric Chest rise. Normal respiratory effort.  Abdomen: Soft to touch, non-tender.  Ext: No cyanosis, edema, or deformity  Skin: No rash. Normal palpation of skin.   Musculoskeletal: Normal digits and nails by inspection. No clubbing.   Neurologic Examination  Mental status/Cognition: Alert, oriented to self, place, month and year, good attention.  Speech/language: Fluent, comprehension intact, object naming intact, repetition intact.  Cranial nerves:   CN II R pupil 72mm, round and reactive ot light. L pupil oblong, poorly reactive to light, ? R temporal VF inconsistencies.   CN III,IV,VI EOM intact, no gaze preference or deviation, no nystagmus    CN V normal sensation in V1, V2, and V3 segments bilaterally    CN VII no asymmetry, no nasolabial fold flattening    CN VIII  normal hearing to speech    CN IX & X normal palatal elevation, no uvular deviation    CN XI 5/5 head turn and 5/5 shoulder shrug bilaterally    CN XII midline tongue protrusion    Motor:  Muscle bulk: normal, tone normal, pronator drift none tremor none Mvmt Root Nerve  Muscle Right Left Comments  SA C5/6 Ax Deltoid 5 5   EF C5/6 Mc Biceps 5 5   EE C6/7/8 Rad Triceps 5 5   WF C6/7 Med FCR 5 5   WE C7/8 PIN ECU 5 5   F Ab C8/T1 U ADM/FDI 5 5   HF L1/2/3 Fem Illopsoas 5 5   KE L2/3/4 Fem Quad 5 5   DF L4/5 D Peron Tib Ant 5 5   PF S1/2 Tibial Grc/Sol 5 5    Reflexes:  Right Left Comments  Pectoralis      Biceps (C5/6) 2 2   Brachioradialis (C5/6) 2 2    Triceps (C6/7) 2 2    Patellar (L3/4) 2 2    Achilles (S1) 1 1    Hoffman      Plantar     Jaw jerk    Sensation:  Light touch Intact BL   Pin prick    Temperature    Vibration   Proprioception    Coordination/Complex Motor:  - Finger to Nose intact BL - Heel to shin intact BL - Rapid alternating movement are normal - Gait: deferred.  Labs   CBC: No results for input(s): WBC, NEUTROABS, HGB, HCT, MCV, PLT in the last 168 hours.  Basic Metabolic Panel:  Lab Results  Component Value Date   NA 139 05/05/2020   K 4.5 05/05/2020   CO2  22 05/05/2020   GLUCOSE 108 (H) 05/05/2020   BUN 17 05/05/2020   CREATININE 1.03 05/05/2020   CALCIUM 9.7 05/05/2020   GFRNONAA >60 05/05/2020   GFRAA 60 (L) 02/28/2020   Lipid Panel:  Lab Results  Component Value Date   LDLCALC 76 09/13/2012   HgbA1c:  Lab Results  Component Value Date   HGBA1C 6.2 (H) 09/13/2012   Urine Drug Screen:     Component Value Date/Time   LABOPIA NONE DETECTED 09/12/2012 1802   COCAINSCRNUR NONE DETECTED 09/12/2012 1802   LABBENZ NONE DETECTED 09/12/2012 1802   AMPHETMU NONE DETECTED 09/12/2012 1802   THCU NONE DETECTED 09/12/2012 1802   LABBARB NONE DETECTED 09/12/2012 1802    Alcohol Level No results found for: [facility]  MRI Brain   Negative for acute infarct Atrophy and moderate chronic ischemic change. Numerous foci of chronic microhemorrhage throughout the cerebellum and cerebrum bilaterally. This is unchanged from the prior study. Differential includes uncontrolled hypertension and cerebral amyloid.  rEEG:  This study is suggestive of non-specific cortical dysfuntion in right temporal region. No seizures or epileptiform discharges were seen throughout the recording.  Impression   Dustin Hamilton is a 79 y.o. 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 The semiology of the event is as follows "leans forward -> expressive aphasia with intact awareness -> feels warm -> no post ictal confusion". He has about 2 events a day.  Given that he is having very frequent spell at about 2 events a day and the uncertainty if this is cardiac or neurologic in origin or potentially both and prior workup with a routine EEG and MRI Brain negative for any epileptiform dischagres or any structural seizure focus, I think it is reasonable to hook him up to cEEG to capture the event so we may be able to characterize it better.  Recommendations  - I ordered cEEG - I ordered Telemetry monitoring for 48 hours - I ordered seizure precautions. - Okay to hold off on Keppra given poor tolerability. - Further recs pending cEEG  ______________________________________________________________________   Thank you for the opportunity to take part in the care of this patient. If you have any further questions, please contact the neurology consultation attending.  Signed,  [place] Pager Number [phone] _ _ _   _ __   _ __ _ _  __ __   _ __   __ _

## 2020-05-22 NOTE — ED Notes (Signed)
EEG Tech at bedside. 

## 2020-05-22 NOTE — Progress Notes (Signed)
Electrophysiology Office Follow up Visit Note:    Date:  05/22/2020   ID:  Dustin Hamilton, DOB April 17, 1941, MRN [id number]  PCP:  Clinic, [place] Cardiologist:  Quay Burow, MD  [facility] Electrophysiologist:  Cristopher Peru, MD    Interval History:    Dustin Hamilton is a 79 y.o. male who presents for a follow up visit. They were last seen while he was hospitalized earlier this month. EP was consulted on May 04, 2020 after he was admitted for a near syncopal episode. At that time it was determined that he had a long history of syncope and had been seen by neurology and cardiology in the past. He had been diagnosed with epilepsy during this last hospitalization and the thought that some of his episodes were due to this seizure activity. He was discharged with a heart monitor and a follow-up appointment. During the hospitalization he was followed closely by the our neurology colleagues who recommended he start an antiepileptic medication. According to his wife who is with him today in clinic, he experienced significant side effects after starting the seizure medication and stopped it shortly after starting it. The heart monitor has since resulted and showed episodes of complete heart block with up to an 8-second pause in his heart rhythm. It is unclear whether or not he was symptomatic during these pauses. It is unclear whether or not he was having seizure activity during the pause. According to his wife he continues to have near daily episodes of what she thinks is seizure activity. Sometimes there are episodes with the repetitive jaw motions that were thought to be seizure activity.  Today he presented to clinic and was at check-in when a [place] was called overhead because he slumped over. When I arrived, the patient was repetitively saying that he was hot and trying to take off his shirt. Shortly after I arrived he stopped responding and we got him back to  the treatment room. His initial blood pressures were low with systolics in the 00F. We hooked him up to a twelve-lead EKG which showed a tachycardia of approximately 130 bpm. It appeared to be a atrial tachycardia with intermittent sinus beats. I did not observe any bradycardic episodes. His blood pressure slowly returned to normal. After we got him back to the exam room he was slow to come around and had some confusion and seemed. I did not see any repetitive motions consistent with seizure activity. EMS was called to transport him to the hospital.     Past Medical History:  Diagnosis Date  . BPH (benign prostatic hyperplasia)   . Cerebrovascular disease   . Congenital anomaly of diaphragm   . Elevated PSA   . Glaucoma, both eyes   . Hemorrhoid   . Hepatitis B surface antigen positive    02-20-2011  . History of adenomatous polyp of colon    2007, 2009 and 2013  tubular adenoma's  . History of alcohol abuse    quit 1963  . History of cerebral parenchymal hemorrhage    01/ 2006  left occiptial lobe related to hypertensive crisis  . History of CVA (cerebrovascular accident)    09-12-2012  left hippocampus/ amygdala junction and per MRI old white matter infarcts--  per pt residual short- term memory issues  . History of fatty infiltration of liver hx visit's at [facility] , last visit 05/ 2014   elvated LFT's ,  via liver bx 2004 related  to hx alcohol and drug abuse (quit 1964)  . 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 HTN (hypertension)   . Renal cyst, left   . Stroke ([place])    hx of 3 strokes in past   . 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 Urethral lesion    urethral mass    Past Surgical History:  Procedure Laterality Date  . CARDIOVASCULAR STRESS TEST  05/05/2007   normal nuclear study w/ no ischemia/  normal LV fucntion and wall motion , ef60%  . COLONOSCOPY  last one 04-06-2012  . CYSTO/  LEFT  RETROGRADE PYELOGRAM/ CYTOLOGY WASHINGS/  URETEROSCOPY  03/05/2000  . INGUINAL HERNIA REPAIR Bilateral 1965 and 1980's  . LAPAROSCOPIC INGUINAL HERNIA WITH UMBILICAL HERNIA Right 98/92/1194  . LIVER BIOPSY  1980's and 2004  . SVT ABLATION N/A 11/15/2018   Procedure: SVT ABLATION;  Surgeon: Evans Lance, MD;  Location: [place] [facility];  Service: Cardiovascular;  Laterality: N/A;  . TRANSTHORACIC ECHOCARDIOGRAM  09/13/2012   moderate LVH,  ef 60-65%/    . TRANSURETHRAL RESECTION OF BLADDER TUMOR N/A 08/11/2016   Procedure: TRANSURETHRAL RESECTION OF BLADDER TUMOR (TURBT);  Surgeon: Cleon Gustin, MD;  Location: [facility];  Service: Urology;  Laterality: N/A;    Current Medications: No outpatient medications have been marked as taking for the 05/22/20 encounter (Office Visit) with Vickie Epley, MD.     Allergies:   Penicillins   Social History   Socioeconomic History  . Marital status: Married    Spouse name: [name]  . Number of children: 1  . Years of education: College  . Highest education level: Not on file  Occupational History  . Occupation: Geophysicist/field seismologist  Tobacco Use  . Smoking status: Former Smoker    Packs/day: 1.00    Years: 5.00    Pack years: 5.00    Types: Cigarettes    Quit date: 11/30/1981    Years since quitting: 38.5  . Smokeless tobacco: Never Used  Vaping Use  . Vaping Use: Never used  Substance and Sexual Activity  . Alcohol use: No    Alcohol/week: 0.0 standard drinks    Comment: hx abuse -- quit:  1963  . 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 Sexual activity: Not on file  Other Topics Concern  . Not on file  Social History Narrative   Patient lives at home with his spouse.   Caffeine Use:  Tea, lots   Social Determinants of Health   Financial Resource Strain: Not on file  Food Insecurity: Not on file  Transportation Needs: Not on file  Physical Activity: Not on file  Stress: Not  on file  Social Connections: Not on file     Family History: The patient's family history includes Diabetes in his maternal grandmother and mother; Heart attack in his father and mother; Heart disease in his maternal grandmother; Kidney failure in his mother; Rheum arthritis in his maternal grandmother and mother; Stroke in his maternal grandmother and mother. There is no history of Colon cancer.  ROS:   Please see the history of present illness.    All other systems reviewed and are negative.  EKGs/Labs/Other Studies Reviewed:    The following studies were reviewed today: Heart monitor, prior notes  EKG:  The ekg ordered today demonstrates narrow complex  tachycardia at approximately 130 bpm. There were rare sinus beats that would come through the tachycardia.  May 18, 2020 ZIO personally reviewed 1 run of SVT lasting less than 20 seconds at a rate of 190 bpm 2 pauses, longest lasting just over 8 seconds  Recent Labs: 05/03/2020: ALT 16 05/04/2020: TSH 1.050 05/05/2020: BUN 17; Creatinine, Ser 1.03; Magnesium 2.3; Platelets 150; Potassium 4.5; Sodium 139 05/06/2020: Hemoglobin 17.2  Recent Lipid Panel    Component Value Date/Time   CHOL 123 09/13/2012 0330   TRIG 112 09/13/2012 0330   HDL 25 (L) 09/13/2012 0330   CHOLHDL 4.9 09/13/2012 0330   VLDL 22 09/13/2012 0330   LDLCALC 76 09/13/2012 0330    Physical Exam:    VS:  There were no vitals taken for this visit.    Wt Readings from Last 3 Encounters:  05/22/20 169 lb 15.6 oz (77.1 kg)  05/14/20 170 lb (77.1 kg)  05/05/20 163 lb (73.9 kg)     GEN: Frail, ill-appearing, lethargic HEENT: Normal NECK: No JVD; No carotid bruits LYMPHATICS: No lymphadenopathy CARDIAC: Tachycardic RESPIRATORY:  Clear to auscultation without rales, wheezing or rhonchi  ABDOMEN: Soft, non-tender, non-distended MUSCULOSKELETAL:  No edema; No deformity  SKIN: Warm and dry NEUROLOGIC: Awake and alert but not responding appropriately  initially. This gradually improved over the course of his time in clinic.  PSYCHIATRIC: Initially altered but more responsive as time went on.  ASSESSMENT:    1. Heart block AV complete ([place])   2. SVT (supraventricular tachycardia) ([place])   3. Syncope, unspecified syncope type   4. Seizures ([place])    PLAN:    In order of problems listed above:  1. Complete heart block Has a history of syncope. Recent ZIO monitor shows multiple episodes of complete heart block with 8 to 9 seconds without ventricular contraction. It is unclear to me at this point whether or not these episodes of complete heart block are responsible for his syncope or whether or not these are a secondary phenomenon to his currently untreated epilepsy. The patient's wife tells me that he has not been taking his antiepileptics given side effects from the initial regimen. The frequency of his altered mental status/syncope episodes (daily almost according to the wife) not matching with the frequency of the complete heart block episodes (3 total episodes on a 2-week monitor) suggest that the complete heart block is not the culprit for his altered mental status episodes. Despite this, I do think he will require a permanent pacemaker. I am suspicious that there is a vasovagal component to his syndrome. For this reason I propose using a closed loop stimulation based pacemaker algorithm from [employer] to help combat these episodes. Pacemaker implantation was discussed with the patient's wife during the visit today prior to his transport to the hospital. We will work with the neurology team to determine the most optimal timing for permanent pacemaker implant. I suspect he will need to be initiated on an antiepileptic while admitted to confirm he tolerates the medication.   Medication Adjustments/Labs and Tests Ordered: Current medicines are reviewed at length with the patient today.  Concerns regarding medicines are outlined above.  No orders  of the defined types were placed in this encounter.  No orders of the defined types were placed in this encounter.    Signed, Lars Mage, MD, [facility]  05/22/2020 7:34 PM    Electrophysiology [place]

## 2020-05-23 ENCOUNTER — Observation Stay (HOSPITAL_BASED_OUTPATIENT_CLINIC_OR_DEPARTMENT_OTHER): Payer: Medicare PPO

## 2020-05-23 DIAGNOSIS — I442 Atrioventricular block, complete: Secondary | ICD-10-CM | POA: Diagnosis not present

## 2020-05-23 DIAGNOSIS — R55 Syncope and collapse: Secondary | ICD-10-CM

## 2020-05-23 DIAGNOSIS — I471 Supraventricular tachycardia: Secondary | ICD-10-CM

## 2020-05-23 DIAGNOSIS — I10 Essential (primary) hypertension: Secondary | ICD-10-CM

## 2020-05-23 DIAGNOSIS — R569 Unspecified convulsions: Secondary | ICD-10-CM | POA: Diagnosis not present

## 2020-05-23 DIAGNOSIS — I459 Conduction disorder, unspecified: Secondary | ICD-10-CM | POA: Insufficient documentation

## 2020-05-23 LAB — CBC
HCT: 48.2 % (ref 39.0–52.0)
Hemoglobin: 16.6 g/dL (ref 13.0–17.0)
MCH: 32 pg (ref 26.0–34.0)
MCHC: 34.4 g/dL (ref 30.0–36.0)
MCV: 92.9 fL (ref 80.0–100.0)
Platelets: 147 10*3/uL — ABNORMAL LOW (ref 150–400)
RBC: 5.19 MIL/uL (ref 4.22–5.81)
RDW: 14.5 % (ref 11.5–15.5)
WBC: 8.1 10*3/uL (ref 4.0–10.5)
nRBC: 0 % (ref 0.0–0.2)

## 2020-05-23 LAB — ECHOCARDIOGRAM COMPLETE
Area-P 1/2: 5.13 cm2
Calc EF: 55.6 %
Height: 71 in
S' Lateral: 2.7 cm
Single Plane A2C EF: 54 %
Single Plane A4C EF: 54.8 %
Weight: 2719.59 oz

## 2020-05-23 LAB — BASIC METABOLIC PANEL
Anion gap: 10 (ref 5–15)
BUN: 16 mg/dL (ref 8–23)
CO2: 23 mmol/L (ref 22–32)
Calcium: 9.2 mg/dL (ref 8.9–10.3)
Chloride: 106 mmol/L (ref 98–111)
Creatinine, Ser: 0.9 mg/dL (ref 0.61–1.24)
GFR, Estimated: >60 mL/min (ref >60)
Glucose, Bld: 171 mg/dL — ABNORMAL HIGH (ref 70–99)
Potassium: 3.1 mmol/L — ABNORMAL LOW (ref 3.5–5.1)
Sodium: 139 mmol/L (ref 135–145)

## 2020-05-23 LAB — TROPONIN I (HIGH SENSITIVITY): Troponin I (High Sensitivity): 27 ng/L — ABNORMAL HIGH (ref <18)

## 2020-05-23 MED ORDER — POTASSIUM CHLORIDE CRYS ER 20 MEQ PO TBCR
40.0000 meq | EXTENDED_RELEASE_TABLET | Freq: Once | ORAL | Status: AC
  Administered 2020-05-23: 40 meq via ORAL
  Filled 2020-05-23: qty 2

## 2020-05-23 MED ORDER — PERFLUTREN LIPID MICROSPHERE
1.0000 mL | INTRAVENOUS | Status: AC | PRN
  Administered 2020-05-23: 2 mL via INTRAVENOUS
  Filled 2020-05-23: qty 10

## 2020-05-23 NOTE — Progress Notes (Signed)
OT Cancellation Note  Patient Details Name: MYLAN SCHWARZ MRN: [id number] DOB: Jun 25, 1940   Cancelled Treatment:    Reason Eval/Treat Not Completed: Active bedrest order.   [place], OTR/L Acute Rehab Services Pager [phone] Office [phone]     Almon Register 05/23/2020, 7:52 AM

## 2020-05-23 NOTE — Progress Notes (Signed)
Subjective: No further episodes overnight.  States he has about 3-4 episodes per week/once every other day.  ROS: negative except above  Examination  Vital signs in last 24 hours: Temp:  [98.1 F (36.7 C)-98.7 F (37.1 C)] 98.1 F (36.7 C) (12/22 0630) Pulse Rate:  [59-109] 74 (12/22 1100) Resp:  [12-25] 15 (12/22 1100) BP: (122-200)/(73-116) 122/73 (12/22 1100) SpO2:  [80 %-100 %] 96 % (12/22 1100) Weight:  [77.1 kg] 77.1 kg (12/21 1638)  General: lying in bed, not in apparent distress CVS: pulse-normal rate and rhythm RS: breathing comfortably Extremities: normal   Neuro: MS: Alert, oriented, follows commands CN: pupils equal and reactive,  EOMI, face symmetric, tongue midline, normal sensation over face, Motor: 5/5 strength in all 4 extremities Reflexes: 2+ bilaterally over patella, biceps, plantars: flexor Coordination: normal Gait: not tested  Basic Metabolic Panel: Recent Labs  Lab 05/22/20 1956 05/23/20 0353  NA 141 139  K 3.6 3.1*  CL 108 106  CO2 21* 23  GLUCOSE 97 171*  BUN 21 16  CREATININE 1.08 0.90  CALCIUM 9.5 9.2  MG 1.9  --     CBC: Recent Labs  Lab 05/22/20 1956 05/23/20 0353  WBC 9.2 8.1  NEUTROABS 6.9  --   HGB 17.6* 16.6  HCT 52.9* 48.2  MCV 94.1 92.9  PLT 153 147*     Coagulation Studies: Recent Labs    05/22/20 1956  LABPROT 14.6  INR 1.2    Imaging MRI brain without contrast 05/03/1930: Negative for acute infarct. Atrophy and moderate chronic ischemic change.  Numerous foci of chronic microhemorrhage throughout the cerebellum and cerebrum bilaterally. This is unchanged from the prior study. Differential includes uncontrolled hypertension and cerebral amyloid.  ASSESSMENT AND PLAN: 79 year old male with history of prior right-sided stroke in 2014, SVT status post ablation, mild dementia presents with episodes of expressive aphasia without loss of consciousness.    Transient alteration of awareness -Semiology of these  episodes is concerning for epilepsy.  Differentials also include amyloid spells.  Recommendations -Continue video EEG monitoring for characterization of spells -Not on any AEDs at this point.  Will consider starting valproic acid if we see any ictal-interictal abnormality -Continue seizure precautions  Ativan 2 mg as needed for generalized tonic-clonic seizure lasting more than 2 minutes for focal seizure lasting more than 5 minutes -Management of rest of comorbidities per primary team  I have spent a total of 35  minutes with the patient reviewing hospital notes,  test results, labs and examining the patient as well as establishing an assessment and plan that was discussed personally with the patient.  > 50% of time was spent in direct patient care.   Zeb Comfort Epilepsy [facility] For questions after 5pm please refer to AMION to reach the Neurologist on call

## 2020-05-23 NOTE — Consult Note (Signed)
Cardiology Consultation:   Patient ID: Dustin Hamilton MRN: [id number]; DOB: 10-13-1940  Admit date: 05/22/2020 Date of Consult: 05/23/2020  Primary Care Provider: [facility] Cardiologist: Quay Burow, MD  [facility] Electrophysiologist:  Cristopher Peru, MD    Patient Profile:   Dustin Hamilton is a 79 y.o. male with a hx of CVA, HTN, pSVT with AVNRT ablation in 11/2018, Mobitz Type 1 AV block, dizziness, seizures and recently detected 8 second pause on cardiac monitor who is being seen today for the evaluation of syncope at the request of Dr. Dina Rich.  History of Present Illness:   Dustin Hamilton is a pleasant 79 yo gentleman with the above mentioned past medical history who presents to the ER after syncopal episode which occurred at our office earlier today. Event summarized in Dr. Lambert/Electrophysiology note from earlier today.   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 His symptoms are usually transient, with mild or no post-ictal state.   With demonstrated complete heart block on recent monitor with 8 second pause, plan was to discuss permanent pacemaker implant. It was at this office visit prior while still in registration where he experienced another episode leading to his ED presentation.  Currently, he feels well and has no CP, sob, palpitatoins. No SOB while lying flat in bed. No current dizziness or lightheadedness.    Past Medical History:  Diagnosis Date  . BPH (benign prostatic hyperplasia)   . Cerebrovascular disease   . Congenital anomaly of diaphragm   . Elevated PSA   . Glaucoma, both eyes   . Hemorrhoid   . Hepatitis B surface antigen positive    02-20-2011  . History of adenomatous polyp of colon    2007, 2009 and 2013  tubular adenoma's  . History of alcohol abuse    quit 1963  . History of cerebral parenchymal  hemorrhage    01/ 2006  left occiptial lobe related to hypertensive crisis  . History of CVA (cerebrovascular accident)    09-12-2012  left hippocampus/ amygdala junction and per MRI old white matter infarcts--  per pt residual short- term memory issues  . History of fatty infiltration of liver hx visit's at [facility] , last visit 05/ 2014   elvated LFT's ,  via liver bx 2004 related to hx alcohol and drug abuse (quit 1964)  . 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 HTN (hypertension)   . Renal cyst, left   . Stroke ([place])    hx of 3 strokes in past   . 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 Urethral lesion    urethral mass    Past Surgical History:  Procedure Laterality Date  . CARDIOVASCULAR STRESS TEST  05/05/2007   normal nuclear study w/ no ischemia/  normal LV fucntion and wall motion , ef60%  . COLONOSCOPY  last one 04-06-2012  . CYSTO/  LEFT RETROGRADE PYELOGRAM/ CYTOLOGY WASHINGS/  URETEROSCOPY  03/05/2000  . INGUINAL HERNIA REPAIR Bilateral 1965 and 1980's  . LAPAROSCOPIC INGUINAL HERNIA WITH UMBILICAL HERNIA Right [id number]  . LIVER BIOPSY  1980's and 2004  . SVT ABLATION N/A 11/15/2018   Procedure: SVT ABLATION;  Surgeon: Evans Lance, MD;  Location: [facility];  Service: Cardiovascular;  Laterality: N/A;  . TRANSTHORACIC ECHOCARDIOGRAM  09/13/2012   moderate LVH,  ef 60-65%/    . TRANSURETHRAL RESECTION OF BLADDER TUMOR N/A 08/11/2016   Procedure: TRANSURETHRAL RESECTION OF BLADDER TUMOR (TURBT);  Surgeon: Cleon Gustin, MD;  Location: [facility];  Service: Urology;  Laterality: N/A;     Home Medications:  Prior to Admission medications   Medication Sig Start Date End Date Taking? Authorizing Provider  amLODipine (NORVASC) 5 MG tablet Take 5 mg by mouth daily.   Yes [provider]  apixaban (ELIQUIS) 5 MG TABS tablet Take 1 tablet (5 mg total) by mouth 2  (two) times daily. 05/06/20 06/05/20 Yes Lattie Haw, MD  brimonidine (ALPHAGAN) 0.2 % ophthalmic solution Place 1 drop into both eyes 2 (two) times daily.   Yes [provider]  dutasteride (AVODART) 0.5 MG capsule Take 0.5 mg by mouth daily.   Yes [provider]  latanoprost (XALATAN) 0.005 % ophthalmic solution Place 1 drop into both eyes at bedtime.    Yes [provider]  meclizine (ANTIVERT) 25 MG tablet Take 25 mg by mouth 3 (three) times daily as needed for dizziness.   Yes [provider]  memantine (NAMENDA) 5 MG tablet Take 17.5 mg by mouth at bedtime.    Yes [provider]  predniSONE (DELTASONE) 20 MG tablet Take 20 mg by mouth. 05/14/20 one a day x 4 more days 05/08/20  Yes [provider]  tamsulosin (FLOMAX) 0.4 MG CAPS capsule Take 0.4 mg by mouth at bedtime.    Yes [provider]  levETIRAcetam (KEPPRA XR) 500 MG 24 hr tablet Take 1 tablet (500 mg total) by mouth every evening. Patient not taking: No sig reported 05/14/20   Penumalli, Earlean Polka, MD    Inpatient Medications: Scheduled Meds: . amLODipine  5 mg Oral Daily  . apixaban  5 mg Oral BID  . brimonidine  1 drop Both Eyes BID  . dutasteride  0.5 mg Oral Daily  . latanoprost  1 drop Both Eyes QHS  . memantine  17.5 mg Oral QHS  . tamsulosin  0.4 mg Oral QHS   Continuous Infusions: . sodium chloride     PRN Meds: acetaminophen **OR** acetaminophen, polyethylene glycol  Allergies:    Allergies  Allergen Reactions  . Penicillins Hives    Social History:   Social History   Socioeconomic History  . Marital status: Married    Spouse name: [name]  . Number of children: 1  . Years of education: College  . Highest education level: Not on file  Occupational History  . Occupation: Geophysicist/field seismologist  Tobacco Use  . Smoking status: Former Smoker    Packs/day: 1.00    Years: 5.00    Pack years: 5.00    Types: Cigarettes    Quit date: 11/30/1981     Years since quitting: 38.5  . Smokeless tobacco: Never Used  Vaping Use  . Vaping Use: Never used  Substance and Sexual Activity  . Alcohol use: No    Alcohol/week: 0.0 standard drinks    Comment: hx abuse -- quit:  1963  . 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 Sexual activity: Not on file  Other Topics Concern  . Not on file  Social History Narrative   Patient lives at home with his spouse.   Caffeine Use:  Tea, lots   Social Determinants of Health   Financial Resource Strain: Not on file  Food  Insecurity: Not on file  Transportation Needs: Not on file  Physical Activity: Not on file  Stress: Not on file  Social Connections: Not on file  Intimate Partner Violence: Not on file    Family History:    Family History  Problem Relation Age of Onset  . Rheum arthritis Mother   . Diabetes Mother   . Stroke Mother   . Heart attack Mother   . Kidney failure Mother   . Heart attack Father   . Heart disease Maternal Grandmother   . Rheum arthritis Maternal Grandmother   . Diabetes Maternal Grandmother   . Stroke Maternal Grandmother   . Colon cancer Neg Hx      ROS:  Please see the history of present illness.   All other ROS reviewed and negative.     Physical Exam/Data:   Vitals:   05/22/20 1715 05/22/20 1730 05/22/20 1930 05/22/20 1945  BP: (!) 144/101 (!) 156/98 (!) 190/108 (!) 143/93  Pulse: 80 88 95 79  Resp: 15 19 (!) 25 20  Temp:      TempSrc:      SpO2: 97% 98% 96% 98%  Weight:      Height:        Intake/Output Summary (Last 24 hours) at 05/23/2020 0056 Last data filed at 05/22/2020 1642 Gross per 24 hour  Intake 0 ml  Output 0 ml  Net 0 ml   Last 3 Weights 05/22/2020 05/14/2020 05/05/2020  Weight (lbs) 169 lb 15.6 oz 170 lb 163 lb  Weight (kg) 77.1 kg 77.111 kg 73.936 kg     Body mass index is 23.71 kg/m.  Constitutional: No acute distress Eyes: sclera non-icteric, normal conjunctiva and lids ENMT: moist  mucous membranes Cardiovascular: regular rhythm, normal rate, no murmurs. S1 and S2 normal. Radial pulses normal bilaterally. No jugular venous distention.  Respiratory: clear to auscultation bilaterally GI : normal bowel sounds, soft and nontender. No distention.   MSK: extremities warm, well perfused. No edema.  NEURO: grossly nonfocal exam, moves all extremities. PSYCH: alert and oriented x 3, normal mood and affect.    EKG:  The EKG was personally reviewed and demonstrates:  NSR, first deg AVB, anterior infarct pattern Telemetry:  Telemetry was personally reviewed and demonstrates:  SR, first deg AVB  Relevant CV Studies: 05/04/20 Echocardiogram  - technically limited. Will repeat.   Laboratory Data:  High Sensitivity Troponin:   Recent Labs  Lab 05/03/20 1400 05/03/20 1601 05/04/20 0626 05/22/20 1956 05/22/20 2322  TROPONINIHS 9 10 16  26* 27*     Chemistry Recent Labs  Lab 05/22/20 1956  NA 141  K 3.6  CL 108  CO2 21*  GLUCOSE 97  BUN 21  CREATININE 1.08  CALCIUM 9.5  GFRNONAA >60  ANIONGAP 12    Recent Labs  Lab 05/22/20 1956  PROT 7.2  ALBUMIN 3.6  AST 16  ALT 28  ALKPHOS 53  BILITOT 0.7   Hematology Recent Labs  Lab 05/22/20 1956  WBC 9.2  RBC 5.62  HGB 17.6*  HCT 52.9*  MCV 94.1  MCH 31.3  MCHC 33.3  RDW 14.5  PLT 153   BNPNo results for input(s): BNP, PROBNP in the last 168 hours.  DDimer No results for input(s): DDIMER in the last 168 hours.   Radiology/Studies:  DG Chest 1 View  Result Date: 05/22/2020 CLINICAL DATA:  Syncope EXAM: CHEST  1 VIEW COMPARISON:  May 03, 2020 FINDINGS: The heart size and mediastinal contours are within  normal limits. Both lungs are clear. The visualized skeletal structures are unremarkable. There is stable elevation of the right hemidiaphragm. IMPRESSION: No active disease. Electronically Signed   By: Constance Holster M.D.   On: 05/22/2020 18:08   DG Ankle 2 Views Right  Result Date:  05/22/2020 CLINICAL DATA:  Right ankle pain. EXAM: RIGHT ANKLE - 2 VIEW COMPARISON:  None. FINDINGS: No evidence of fracture, dislocation, or joint effusion. Degenerative change in the navicular. Posterior calcaneal. No aggressive appearing focal bone abnormality. Soft tissues are unremarkable. IMPRESSION: No acute displaced fracture or dislocation of the right ankle on this two view radiograph. Electronically Signed   By: Iven Finn M.D.   On: 05/22/2020 18:38     Assessment and Plan:   Active Problems:   Seizure ([place]) Syncope CHB  Mr. Kimmel presents with recurrent syncope at our cardiology office, with previously demonstrated complete heart block with 8 second pause on recent cardiac monitor and seizure activity. He is having frequent episodes, and experiences a post-ictal confusion afterward.   I've reviewed the plan with EP, as well as Neurology at the bedside. Neuro plans to perform continuous EEG for 24-48 hours to quantify one of these episodes. This may help clarify the driving factor for heart block, whether primary cardiac pathology vs a vagal response to a neurologic event. Patient will likely be planned for pacemaker while in hospital, but timing will be determined pending neurology workup.   Per chart review, the patient consistently reports symptoms that involve leaning forward and then a prodrome prior to unresponsiveness. This change in position prompting episodes is concerning for a possible carotid body process. I have reviewed cross sectional imaging of head/neck and chest since 2018. His syncope is described as several years in duration. The CT Head wo contrast in April captures part of the area of interest, but appears incompletely visualized. A neck MRA from 10/16/2016 does not mention pathology at the carotid body. Will review with neurology, but it may be helpful to exclude carotid body tumor with CTA of the neck, given frequency of symptoms and lack of clear inciting  factors.   No changes to cardiovascular medications at this time.   I have independently reviewed the echocardiogram images from 05/04/20. Images are technically very challenging and limited. I have ordered a repeat echocardiogram to ensure we have an appropriate evaluation of LV and RV function as well as wall motion, valves.  May want to reconsider use of tamsulosin given alpha 1 antagonist activity. While syncope episodes do not seem orthostatic in nature, would review in the context of his current presentation and age.     For questions or updates, please contact [place] Please consult [email or web address] for contact info under    Signed, Elouise Munroe, MD

## 2020-05-23 NOTE — Progress Notes (Signed)
PT Cancellation Note  Patient Details Name: Dustin Hamilton MRN: [id number] DOB: 12-Jun-1940   Cancelled Treatment:    Reason Eval/Treat Not Completed: Active bedrest order Per MD, wants bedrest orders to remain. Will follow up as activity orders updated and as schedule allows.   Reuel Derby, PT, DPT  Acute Rehabilitation Services  Pager: [phone] Office: [phone]    Rudean Hitt 05/23/2020, 10:05 AM

## 2020-05-23 NOTE — Procedures (Addendum)
Patient Name: Dustin Hamilton  MRN: [id number]  Epilepsy Attending: Lora Havens  Referring Physician/Provider: Dr Silvio Pate Date: 05/22/2020 Duration: 21.50 mins  Patient history: 79 y.o. 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 The semiology of the event is as follows "leans forward -> expressive aphasia with intact awareness -> feels warm -> no post ictal confusion". He has about 2 events a day. EEG to evaluate for seizure  Level of alertness: Awake  AEDs during EEG study: None  Technical aspects: This EEG study was done with scalp electrodes positioned according to the 10-20 International system of electrode placement. Electrical activity was acquired at a sampling rate of 500Hz  and reviewed with a high frequency filter of 70Hz  and a low frequency filter of 1Hz . EEG data were recorded continuously and digitally stored.   Description: The posterior dominant rhythm consists of 9-10 Hz activity of moderate voltage (25-35 uV) seen predominantly in posterior head regions, symmetric and reactive to eye opening and eye closing.  Hyperventilation and photic stimulation were not performed.     IMPRESSION: This study is within normal limits. No seizures or epileptiform discharges were seen throughout the recording.  Alquan Morrish Barbra Sarks

## 2020-05-23 NOTE — Progress Notes (Signed)
  Echocardiogram 2D Echocardiogram has been performed.  Dustin Hamilton 05/23/2020, 11:53 AM

## 2020-05-23 NOTE — ED Notes (Signed)
EEG aware of pt being tranfers to floor, ok to move and they will be down for monitor

## 2020-05-23 NOTE — ED Notes (Signed)
Cardiac ECHO being done at bedside  

## 2020-05-23 NOTE — Progress Notes (Signed)
OT Cancellation Note  Patient Details Name: Dustin Hamilton MRN: [id number] DOB: 08/17/1940   Cancelled Treatment:    Reason Eval/Treat Not Completed: Patient at procedure or test/ unavailable. Pt on continous EEG. [place] Circle, OTR/L Acute Rehab Services Pager [phone] Office [phone]     Almon Register 05/23/2020, 10:04 AM

## 2020-05-23 NOTE — Progress Notes (Signed)
Assisted transport in Patient Move to [phone] with EEG equipment with no issues. EEG is reconnected and monitored.

## 2020-05-23 NOTE — Progress Notes (Signed)
LTM maint complete - no skin breakdown under:  FP1, FP2   

## 2020-05-23 NOTE — ED Notes (Signed)
Lunch Tray Ordered @ 1102. 

## 2020-05-23 NOTE — Progress Notes (Signed)
Family Medicine Teaching Service Daily Progress Note Intern Pager: [phone]  Patient name: Dustin Hamilton Medical record number: [id number] Date of birth: 1940-12-16 Age: 79 y.o. Gender: male  Primary Care Provider: [facility] Consultants: Neurology, Cardiology Code Status: Full   Pt Overview and Major Events to Date:  12/21 Admitted  12/22 EEG completed   Assessment and Plan: Dustin Hamilton is a 79 y.o. male presenting by EMS s/p seizure-like epsiode. PMH is significant for CVA in 2014, HTN, pSVT with AVNRT ablation in 11/2018, Mobitz Type 1 AV block, dizziness.   Seizure: acute, resolved Patient with a history of seizures and followed by neurologist, Dr. [name]. EEG within normal limits- did not show any seizures or epileptiform discharges to clarify the driving factor for heart block, whether primary cardiac pathology vs a vagal response to a neurologic event. Per cards, patient will likely be planned for pacemaker while in hospital, but timing will be determined pending neurology workup - Neurology following, appreciate recommendations -Cardiology following, appreciate recommendations - Continuous cardiac monitoring, pulse ox - Orthostatic vital signs  - AM CBC, BMP - PT/OT eval and treat - Seizure precautions/Fall precautions   Right Ankle Pain: acute, stable Patient notes right ankle pain that began about 4 days ago.  Denies any injury, trauma to the area.  Full range of motion, 5 out of 5 strength.  Ankle x-ray in ED negative for fracture. Pain on palpation to area lateral to Achilles tendon and posterior to lateral malleolus. Most likely differential includes anterior talofibular sprain.  - PT eval and treat  Complete heart block Recent ZIO monitor showed multiple episodes of complete heart block with 8-9 seconds without ventricular contraction (3 episodes over 2 weeks) Potentially related to syncope  - eventual permanent pacemaker implant per cards -  continuous cardiac monitoring    Atrial Flutter  On Eliquis 5 mg BID. Echo 12/03, with EF 67-34%, grade 1 diastolic dysfunction but no regional wall abnormalities.   - Continue home Eliquis  Hypokalemia K+ 3.1  - s/p 40 meq KCl - am BMP  HTN: chronic, stable On Amlodipine 5 mg. BP elevated while in ED with the highest pressure being 200/116. Now improved to 193'X systolic. Per chart review it appears patient was recommended to increase to 10 mg but has not. Would likely benefit from additional anti-HTN medications.  - Continue Amlodipine 5 mg daily; consider increasing to 10 mg per previous recommendations if BP stays persistently elevated  - Vitals per floor protocol   Hx Polycythemia  Hx of Thrombocytopenia  Polycythemic at 17.6 >16.6. Most recent baseline appears to be 15-16. Per chart review, patient with history of polycythemia as far back as 2008 and as high as 18.7 in 2017. Hx of thrombocytopenia on prior admission. Platelets on admission 153- seems to have resolved. - Monitor with daily CBC  - Consider serum EPO vs outpatient work up  BPH: chronic, stable On Dutasteride 0.5 mg daily and Tamsulosin 0.4 mg daily. Patient with positive orthostatics on previous admission. Pending repeat orthostatics might need to consider d/c one of his home medications.  - Continue Dutasteride 0.5 mg every morning - Continue Tamsulosin 0.4 mg nightly  History of CVA in 2014 Followed by Neurologist, Dr. [name]. Previously on Plavix and ASA but per wife, was recently transitioned to just Eliquis.  - PT/OT eval and treat - Continue Eliquis 5 mg BID  Glaucoma: chronic, stable Vision difficulties in left eye.  - Continue Brimonidine 0.2% ophthalmic solution 1 drop  both eyes BID  - Continue Latanoprost 0.005% ophthalmic solution, 1 drop both eyes QHS  Mild dementia: chronic, stable Follows with Neurology, as above. On Namenda 17.5mg  QHS  - Continue Namenda nightly   FEN/GI: regular  diet  PPx: Eliquis   Disposition: Med-tele. Dispo pending Cards and Neuro   Subjective:  No acute events overnight. Very pleasant gentleman who states that he is feeling much better today than he has in a while. Denies any dizziness, SOB, CP. Only complaint is his right ankle pain. Denies any inciting injury.   Objective: Temp:  [98.1 F (36.7 C)-98.7 F (37.1 C)] 98.1 F (36.7 C) (12/22 0630) Pulse Rate:  [59-109] 59 (12/22 0630) Resp:  [12-25] 14 (12/22 0630) BP: (139-200)/(93-116) 148/94 (12/22 0630) SpO2:  [94 %-98 %] 94 % (12/22 0630) Weight:  [77.1 kg] 77.1 kg (12/21 1638) Physical Exam: General: well appearing, pleasant, NAD Cardiovascular: RRR no murmurs Respiratory: CTAB. Normal WOB Abdomen: soft, non-distended. Non-tender to palpation  Extremities: warm, dry. No edema. Distal pulses 2+ bilaterally . R ankle tender to palpation laterally below and posterior to malleolus   Laboratory: Recent Labs  Lab 05/22/20 1956 05/23/20 0353  WBC 9.2 8.1  HGB 17.6* 16.6  HCT 52.9* 48.2  PLT 153 147*   Recent Labs  Lab 05/22/20 1956 05/23/20 0353  NA 141 139  K 3.6 3.1*  CL 108 106  CO2 21* 23  BUN 21 16  CREATININE 1.08 0.90  CALCIUM 9.5 9.2  PROT 7.2  --   BILITOT 0.7  --   ALKPHOS 53  --   ALT 28  --   AST 16  --   GLUCOSE 97 171*    Imaging/Diagnostic Tests:  DG Chest 1 View  Result Date: 05/22/2020 CLINICAL DATA:  Syncope EXAM: CHEST  1 VIEW COMPARISON:  May 03, 2020 FINDINGS: The heart size and mediastinal contours are within normal limits. Both lungs are clear. The visualized skeletal structures are unremarkable. There is stable elevation of the right hemidiaphragm. IMPRESSION: No active disease. Electronically Signed   By: Constance Holster M.D.   On: 05/22/2020 18:08   DG Ankle 2 Views Right  Result Date: 05/22/2020 CLINICAL DATA:  Right ankle pain. EXAM: RIGHT ANKLE - 2 VIEW COMPARISON:  None. FINDINGS: No evidence of fracture, dislocation, or  joint effusion. Degenerative change in the navicular. Posterior calcaneal. No aggressive appearing focal bone abnormality. Soft tissues are unremarkable. IMPRESSION: No acute displaced fracture or dislocation of the right ankle on this two view radiograph. Electronically Signed   By: Iven Finn M.D.   On: 05/22/2020 18:38    Shary Key, DO 05/23/2020, 7:11 AM PGY-1, [place] Intern pager: [phone], text pages welcome

## 2020-05-23 NOTE — Procedures (Addendum)
Patient Name: Dustin Hamilton  MRN: [id number]  Epilepsy Attending: Lora Havens  Referring Physician/Provider: Dr Donnetta Simpers Duration: 05/22/2020 2146 to 05/23/2020 2146  Patient history: 79 y.o.malewith PMH significant for prior stroke in 2014, hx of SVT s/p ablation 6/20, hx of AV block, mild dementia whopresents today for evaluation of multiple spells that are clinically difficult to determine if they are partial complex seizures.The semiology of the event is as follows "leans forward -> expressive aphasia with intact awareness -> feels warm -> no post ictal confusion". He has about 2 events a day. EEG to evaluate for seizure  Level of alertness: Awake, asleep  AEDs during EEG study: None  Technical aspects: This EEG study was done with scalp electrodes positioned according to the 10-20 International system of electrode placement. Electrical activity was acquired at a sampling rate of 500Hz  and reviewed with a high frequency filter of 70Hz  and a low frequency filter of 1Hz . EEG data were recorded continuously and digitally stored.   Description: The posterior dominant rhythm consists of 9-10 Hz activity of moderate voltage (25-35 uV) seen predominantly in posterior head regions, symmetric and reactive to eye opening and eye closing.  Sleep was characterized by vertex waves, sleep spindles (12-14hz ), maximal  frontoHyperventilation and photic stimulation were not performed.     IMPRESSION: This study is within normal limits. No seizures or epileptiform discharges were seen throughout the recording.  [name]

## 2020-05-23 NOTE — Progress Notes (Signed)
Progress Note  Patient Name: Dustin Hamilton Date of Encounter: 05/23/2020  [facility] Cardiologist: Quay Burow, MD     Subjective   Patient well-known to me, admitted yesterday from the office while seeing Dr. Quentin Ore for syncope and tachycardia.  Inpatient Medications    Scheduled Meds: . amLODipine  5 mg Oral Daily  . apixaban  5 mg Oral BID  . brimonidine  1 drop Both Eyes BID  . dutasteride  0.5 mg Oral Daily  . latanoprost  1 drop Both Eyes QHS  . memantine  17.5 mg Oral QHS   Continuous Infusions:  PRN Meds: acetaminophen **OR** acetaminophen, polyethylene glycol   Vital Signs    Vitals:   05/23/20 0715 05/23/20 0730 05/23/20 0745 05/23/20 0900  BP: (!) 142/91 (!) 134/94 (!) 130/113 (!) 141/94  Pulse: 76 78 (!) 103 88  Resp: 15 17 18 19   Temp:      TempSrc:      SpO2: 98% 98% 97% 98%  Weight:      Height:        Intake/Output Summary (Last 24 hours) at 05/23/2020 1007 Last data filed at 05/22/2020 1642 Gross per 24 hour  Intake 0 ml  Output 0 ml  Net 0 ml   Last 3 Weights 05/22/2020 05/14/2020 05/05/2020  Weight (lbs) 169 lb 15.6 oz 170 lb 163 lb  Weight (kg) 77.1 kg 77.111 kg 73.936 kg      Telemetry    Sinus rhythm- Personally Reviewed  ECG    Sinus rhythm at 98 with left anterior fascicular block and septal Q waves performed yesterday.- Personally Reviewed  Physical Exam   GEN: No acute distress.   Neck: No JVD Cardiac: RRR, no murmurs, rubs, or gallops.  Respiratory: Clear to auscultation bilaterally. GI: Soft, nontender, non-distended  MS: No edema; No deformity. Neuro:  Nonfocal  Psych: Normal affect   Labs    High Sensitivity Troponin:   Recent Labs  Lab 05/03/20 1400 05/03/20 1601 05/04/20 0626 05/22/20 1956 05/22/20 2322  TROPONINIHS 9 10 16  26* 27*      Chemistry Recent Labs  Lab 05/22/20 1956 05/23/20 0353  NA 141 139  K 3.6 3.1*  CL 108 106  CO2 21* 23  GLUCOSE 97 171*  BUN 21 16   CREATININE 1.08 0.90  CALCIUM 9.5 9.2  PROT 7.2  --   ALBUMIN 3.6  --   AST 16  --   ALT 28  --   ALKPHOS 53  --   BILITOT 0.7  --   GFRNONAA >60 >60  ANIONGAP 12 10     Hematology Recent Labs  Lab 05/22/20 1956 05/23/20 0353  WBC 9.2 8.1  RBC 5.62 5.19  HGB 17.6* 16.6  HCT 52.9* 48.2  MCV 94.1 92.9  MCH 31.3 32.0  MCHC 33.3 34.4  RDW 14.5 14.5  PLT 153 147*    BNPNo results for input(s): BNP, PROBNP in the last 168 hours.   DDimer No results for input(s): DDIMER in the last 168 hours.   Radiology    DG Chest 1 View  Result Date: 05/22/2020 CLINICAL DATA:  Syncope EXAM: CHEST  1 VIEW COMPARISON:  May 03, 2020 FINDINGS: The heart size and mediastinal contours are within normal limits. Both lungs are clear. The visualized skeletal structures are unremarkable. There is stable elevation of the right hemidiaphragm. IMPRESSION: No active disease. Electronically Signed   By: Constance Holster M.D.   On: 05/22/2020 18:08  DG Ankle 2 Views Right  Result Date: 05/22/2020 CLINICAL DATA:  Right ankle pain. EXAM: RIGHT ANKLE - 2 VIEW COMPARISON:  None. FINDINGS: No evidence of fracture, dislocation, or joint effusion. Degenerative change in the navicular. Posterior calcaneal. No aggressive appearing focal bone abnormality. Soft tissues are unremarkable. IMPRESSION: No acute displaced fracture or dislocation of the right ankle on this two view radiograph. Electronically Signed   By: Iven Finn M.D.   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 He has about 2 events a day. EEG to evaluate for seizure  Level of alertness: Awake, asleep  AEDs during EEG study: None  Technical aspects: This EEG study was done with scalp electrodes positioned according to the 10-20 International system of electrode placement. Electrical activity was acquired at a sampling rate of 500Hz  and reviewed with a high frequency filter of 70Hz  and a low frequency filter of 1Hz . EEG data were recorded continuously and digitally stored.  Description: The posterior dominant rhythm consists of 9-10 Hz activity of moderate voltage (25-35 uV) seen predominantly in posterior head regions, symmetric and reactive to eye opening and eye closing.  Sleep was characterized by vertex waves, sleep spindles (12-14hz ), maximal  frontoHyperventilation and photic stimulation were not performed.    IMPRESSION: This study is within normal limits. No seizures or epileptiform discharges were seen throughout the recording.  Lora Havens   Portable EEG  Result Date: 05/23/2020 Lora Havens, MD     05/23/2020  9:27 AM Patient Name: Dustin Hamilton MRN: [id number] Epilepsy Attending: Lora Havens Referring Physician/Provider: Dr Silvio Pate Date: 05/22/2020 Duration: 21.50 mins Patient history: 79 y.o. 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 The semiology of the event is as follows "leans forward -> expressive aphasia with intact awareness -> feels warm -> no post ictal confusion". He has about 2 events a day. EEG to evaluate for seizure Level  of alertness: Awake AEDs during EEG study: None Technical aspects: This EEG study was done with scalp electrodes positioned according to the 10-20 International system of electrode placement. Electrical activity was acquired at a sampling rate of 500Hz  and reviewed with a high frequency filter of 70Hz  and a low frequency filter of 1Hz . EEG data were recorded continuously and digitally stored. Description: The posterior dominant rhythm consists of 9-10 Hz activity of moderate voltage (25-35 uV) seen predominantly in posterior head regions, symmetric and reactive to eye opening and eye closing.  Hyperventilation and photic stimulation were not performed.   IMPRESSION: This study is within normal limits. No seizures or epileptiform discharges were seen throughout the recording. Lora Havens    Cardiac Studies   2D  echocardiogram (05/04/2020)    1. The LV images are technically difficult , even with Definity contrast  . . Left ventricular ejection fraction, by estimation, is 55 to 60%. The  left ventricle has normal function. The left ventricle has no regional  wall motion abnormalities. Left  ventricular diastolic parameters are consistent with Grade I diastolic  dysfunction (impaired relaxation).  2. Right ventricular systolic function is normal. The right ventricular  size is moderately enlarged.  3. A small pericardial effusion is present. There is no evidence of  cardiac tamponade.  4. The mitral valve is grossly normal. Trivial mitral valve  regurgitation.  5. The aortic valve was not well visualized. Aortic valve regurgitation  is not visualized. No aortic stenosis is present.   Patient Profile     Dustin Hamilton is a 79 y.o. male with a hx of CVA, HTN, pSVT with AVNRT ablation in 11/2018, Mobitz Type 1 AV block, dizziness, seizures and recently detected 8 second pause on cardiac monitor who is being seen today for the evaluation of syncope at the request of Dr.  Dina Rich.  Assessment & Plan    1: Syncope-patient was seen in Dr. Mardene Speak office yesterday and had witnessed syncope with EKG at that time showing atrial tachycardia at a heart rate of 130.  He was hypotensive.  He is currently in sinus rhythm.  He has had AVNRT ablation by Dr. Lovena Le 6/20.  He had a recent Zio patch which showed a second pauses.  Is unclear whether these are primary or secondary/vagal related to seizure activity.  He is currently being worked up by neurology and is getting EEG.  Dr. Quentin Ore is aware with plans to perform permanent transvenous pacemaking by the end of the week.  2: SVT-status post AVNRT ablation by Dr. Lovena Le 11/15/2018.  He did have atrial tachycardia witnessed by Dr. Quentin Ore in the office yesterday.  He is currently not on a beta-blocker.  3: Atypical chest pain-negative Myoview 06/12/2017.  Minimal calcifications on recent chest CT.  No chest pain since I saw him in the office in August.  4: Essential hypertension-currently on amlodipine.  Beta-blocker not used because of long pauses.  Blood pressure in the ER 140/94.  Renal function normal.  May be a candidate to start an ACE and/or ARB.  The EP service/Dr. Quentin Ore will be taking over rounding tomorrow after neuro work-up complete to determine timing of permanent transvenous pacemaker insertion.      For questions or updates, please contact [place] Please consult [email or web address] for contact info under        Signed, Quay Burow, MD  05/23/2020, 10:07 AM

## 2020-05-23 NOTE — Progress Notes (Signed)
PT has just arrived to the unit via hospital bed. Pt denies pain and has all equipment transferred along with them. Family si soon to be at bedside. Call light and telephone is within  05/23/20 1809  Vitals  Temp 97.9 F (36.6 C)  Temp Source Oral  BP (!) 159/104  MAP (mmHg) 122  BP Location Left Arm  BP Method Automatic  Patient Position (if appropriate) Lying  Pulse Rate 88  Pulse Rate Source Dinamap  Resp 20  Level of Consciousness  Level of Consciousness Alert  MEWS COLOR  MEWS Score Color Green  Pain Assessment  Pain Scale 0-10  Pain Score 0  Patients Stated Pain Goal 0  MEWS Score  MEWS Temp 0  MEWS Systolic 0  MEWS Pulse 0  MEWS RR 0  MEWS LOC 0  MEWS Score 0   reach.

## 2020-05-23 NOTE — ED Notes (Signed)
Patient denies pain and is resting comfortably.  

## 2020-05-23 NOTE — Hospital Course (Addendum)
Dustin Hamilton is a 79 y.o. male who presented s/p seizure-like epsiode. PMH is significant for CVA, HTN, pSVT with AVNRT ablation in 11/2018, Mobitz Type 1 AV block, dizziness.   Unresponsive episodes of unknown etiology Patient admitted status post seizure-like episode.  Neuro exam benign. Neurology consulted in the ED.  Patient started on continuous EEG which revealed no evidence for seizure activity even though he had several "spells" during the continuous EEG.  Patient was on continuous EEG from 12/23-26.  Neurology concluded no need for antiepileptic drugs for this patient.  Also recommended driving restriction for 6 months given episodic alterations of awareness.  Neurology states that if episodes resolve after the pacemaker implant, will not need further work-up; however if episodes persist consider referral to ENT for evaluation of BPPV.  If ENT work-up is negative can consider empiric trial of EPA 250 mg twice daily for possible amyloid spells.  Hx paroxysmal SVT  H/o First degree AV Block s/p pacemaker placement 12/27 Cardiology consulted, patient on continuous telemetry monitoring for most of admission.  While cardiology wonders if these "spells" are from vasovagal syncope/orthostatic intolerance, also concern for observed intermittent complete AV block on telemetry.  Permanent pacemaker placed via cardiology on 05/28/2020.  See neuro notes above for other non-cardiac workup.  On day of discharge, patient reported feeling well with no complaints. Cardiology recommended discontinuation of eliquis as he was likely misdiagnosed with a fib previously (likely artifact).   Gout of left great toe Patient had sudden onset of extreme pain in left foot that was a barrier to patient walking. Xray imaging was obtained with no signs of fracture of trauma noted. MRI unable to be obtained due to it being too close to pacemaker insertion (must wait 6 weeks). Patient was treated with a trial of colchicine  with improvement and discharged on colchicine daily for 2 more days.  Right-Ankle Pain Patient complaining of 3 days of ankle pain prior to admission. Without trauma or injury to the area. Ankle x-ray in ED negative for fracture. Evaluated and treated by PT/OT. Patient reports ankle actually feels better after movement and PT.  Unsure etiology of right ankle pain; improvement after use incongruent with ligamentous injury.  Recommend stretching and gentle usage as tolerated.  Labile blood pressures   history of hypertension Patient put on home amlodipine 5 mg on admission due to hypertension with systolics in the 956O.  However, several days later was held due to soft blood pressures.  Amlodipine restarted 12/24.  At time of discharge, blood pressures last 24 hours had been 87-120/65-81, with last measurement 102/65.   Other problems chronic and stable.   Items for follow-up Driving restriction for 6 months per [place] law.  Consider further work up for polycythemia  Consider d/c either dutasteride or tamsulosin due to +orthostatics  Tamsulosin was restarted 12/27; it was held on admission out of concern for orthostatics. However, patient has several unresponsive "spells" even while off tamsulosin for about a week. Likely non-contributory to episodes.  Recheck BP. BP medications held initially during admission due to lower blood pressures. Consider aricept and/or namenda as possible etiology of syncopal episodes and intermittent heart block. Consider discontinuation of both. This should be a shared decision making conversation with PCP, family, and whomever started the namenda. Was started on Atorvastatin 40mg   Per cards, perform CTA head/neck as outpatient to evaluate for possible etiology of the near-syncopal/unresponsive episode Eliquis discontinued patient's previous a fib dx wrong  Neurology states that if episodes  resolve after the pacemaker implant, will not need further work-up; however if  episodes persist consider referral to ENT for evaluation of BPPV.  If ENT work-up is negative can consider empiric trial of EPA 250 mg twice daily for possible amyloid spells. Avoid NSAIDs Consider adding allopurinol as gout prophylaxis; patient treated with colchicine and to continue for 3 days total.

## 2020-05-24 DIAGNOSIS — Z87891 Personal history of nicotine dependence: Secondary | ICD-10-CM | POA: Diagnosis not present

## 2020-05-24 DIAGNOSIS — H409 Unspecified glaucoma: Secondary | ICD-10-CM | POA: Diagnosis present

## 2020-05-24 DIAGNOSIS — D6959 Other secondary thrombocytopenia: Secondary | ICD-10-CM | POA: Diagnosis present

## 2020-05-24 DIAGNOSIS — I4892 Unspecified atrial flutter: Secondary | ICD-10-CM | POA: Diagnosis present

## 2020-05-24 DIAGNOSIS — I10 Essential (primary) hypertension: Secondary | ICD-10-CM | POA: Diagnosis present

## 2020-05-24 DIAGNOSIS — Z8673 Personal history of transient ischemic attack (TIA), and cerebral infarction without residual deficits: Secondary | ICD-10-CM | POA: Diagnosis not present

## 2020-05-24 DIAGNOSIS — I459 Conduction disorder, unspecified: Secondary | ICD-10-CM | POA: Diagnosis not present

## 2020-05-24 DIAGNOSIS — M10072 Idiopathic gout, left ankle and foot: Secondary | ICD-10-CM | POA: Diagnosis present

## 2020-05-24 DIAGNOSIS — R569 Unspecified convulsions: Secondary | ICD-10-CM | POA: Diagnosis present

## 2020-05-24 DIAGNOSIS — R4701 Aphasia: Secondary | ICD-10-CM | POA: Diagnosis present

## 2020-05-24 DIAGNOSIS — I442 Atrioventricular block, complete: Secondary | ICD-10-CM | POA: Diagnosis present

## 2020-05-24 DIAGNOSIS — E854 Organ-limited amyloidosis: Secondary | ICD-10-CM | POA: Diagnosis present

## 2020-05-24 DIAGNOSIS — E876 Hypokalemia: Secondary | ICD-10-CM | POA: Diagnosis present

## 2020-05-24 DIAGNOSIS — Z79899 Other long term (current) drug therapy: Secondary | ICD-10-CM | POA: Diagnosis not present

## 2020-05-24 DIAGNOSIS — Z950 Presence of cardiac pacemaker: Secondary | ICD-10-CM | POA: Diagnosis not present

## 2020-05-24 DIAGNOSIS — Z20822 Contact with and (suspected) exposure to covid-19: Secondary | ICD-10-CM | POA: Diagnosis present

## 2020-05-24 DIAGNOSIS — M79672 Pain in left foot: Secondary | ICD-10-CM | POA: Diagnosis present

## 2020-05-24 DIAGNOSIS — G40909 Epilepsy, unspecified, not intractable, without status epilepticus: Secondary | ICD-10-CM | POA: Diagnosis present

## 2020-05-24 DIAGNOSIS — Z7901 Long term (current) use of anticoagulants: Secondary | ICD-10-CM | POA: Diagnosis not present

## 2020-05-24 DIAGNOSIS — I680 Cerebral amyloid angiopathy: Secondary | ICD-10-CM | POA: Diagnosis not present

## 2020-05-24 DIAGNOSIS — F0390 Unspecified dementia without behavioral disturbance: Secondary | ICD-10-CM | POA: Diagnosis present

## 2020-05-24 DIAGNOSIS — R55 Syncope and collapse: Secondary | ICD-10-CM | POA: Diagnosis not present

## 2020-05-24 DIAGNOSIS — N400 Enlarged prostate without lower urinary tract symptoms: Secondary | ICD-10-CM | POA: Diagnosis present

## 2020-05-24 LAB — CBC
HCT: 44.9 % (ref 39.0–52.0)
Hemoglobin: 15 g/dL (ref 13.0–17.0)
MCH: 31.1 pg (ref 26.0–34.0)
MCHC: 33.4 g/dL (ref 30.0–36.0)
MCV: 93 fL (ref 80.0–100.0)
Platelets: 141 10*3/uL — ABNORMAL LOW (ref 150–400)
RBC: 4.83 MIL/uL (ref 4.22–5.81)
RDW: 14.3 % (ref 11.5–15.5)
WBC: 8.5 10*3/uL (ref 4.0–10.5)
nRBC: 0 % (ref 0.0–0.2)

## 2020-05-24 LAB — BASIC METABOLIC PANEL
Anion gap: 10 (ref 5–15)
BUN: 22 mg/dL (ref 8–23)
CO2: 23 mmol/L (ref 22–32)
Calcium: 9.1 mg/dL (ref 8.9–10.3)
Chloride: 105 mmol/L (ref 98–111)
Creatinine, Ser: 1.43 mg/dL — ABNORMAL HIGH (ref 0.61–1.24)
GFR, Estimated: 50 mL/min — ABNORMAL LOW (ref >60)
Glucose, Bld: 150 mg/dL — ABNORMAL HIGH (ref 70–99)
Potassium: 3.6 mmol/L (ref 3.5–5.1)
Sodium: 138 mmol/L (ref 135–145)

## 2020-05-24 MED ORDER — ENOXAPARIN SODIUM 40 MG/0.4ML ~~LOC~~ SOLN
40.0000 mg | SUBCUTANEOUS | Status: DC
  Administered 2020-05-24 – 2020-05-31 (×8): 40 mg via SUBCUTANEOUS
  Filled 2020-05-24 (×8): qty 0.4

## 2020-05-24 MED ORDER — SODIUM CHLORIDE 0.9 % IV SOLN: INTRAVENOUS | Status: DC

## 2020-05-24 NOTE — Progress Notes (Addendum)
Subjective: Patient states he had one of his typical episodes this morning when he was asked to get out off his bed for physical therapy and suddenly started feeling dizzy. States he does not remember the episode but remembers the episode improving after about 10 minutes.  He does state that all his episodes usually happen when he is moving around and therefore he has not had any episodes since he has been here because he is predominantly been in bed.   ROS: negative except above  Examination  Vital signs in last 24 hours: Temp:  [97.9 F (36.6 C)-98.1 F (36.7 C)] 98.1 F (36.7 C) (12/23 0747) Pulse Rate:  [67-88] 86 (12/23 0747) Resp:  [15-23] 18 (12/23 0747) BP: (105-159)/(73-104) 105/77 (12/23 0747) SpO2:  [94 %-99 %] 98 % (12/23 0334)  General: lying in bed, not in apparent distress CVS: pulse-normal rate and rhythm RS: breathing comfortably Extremities: normal  Neuro: AOx3, cranial nerves II to XII grossly intact, 5/5 in all 4 extremities.  Basic Metabolic Panel: Recent Labs  Lab 05/22/20 1956 05/23/20 0353 05/24/20 0317  NA 141 139 138  K 3.6 3.1* 3.6  CL 108 106 105  CO2 21* 23 23  GLUCOSE 97 171* 150*  BUN 21 16 22   CREATININE 1.08 0.90 1.43*  CALCIUM 9.5 9.2 9.1  MG 1.9  --   --     CBC: Recent Labs  Lab 05/22/20 1956 05/23/20 0353 05/24/20 0317  WBC 9.2 8.1 8.5  NEUTROABS 6.9  --   --   HGB 17.6* 16.6 15.0  HCT 52.9* 48.2 44.9  MCV 94.1 92.9 93.0  PLT 153 147* 141*     Coagulation Studies: Recent Labs    05/22/20 1956  LABPROT 14.6  INR 1.2    Imaging No new brain imaging overnight  ASSESSMENT AND PLAN: 79 year old male with history of prior right-sided stroke in 2014, SVT status post ablation, mild dementia presents with episodes of expressive aphasia without loss of consciousness.    Transient alteration of awareness -Semiology of these episodes does not concerning for epilepsy.  However patient also states that most of his episodes  happen when he is moving in at least the description of the episode yet this morning was concerning for possible BPPV/vertigo. -Reviewed telemetry strip during the episode and it appears that patient did miss a beat but that is unlikely to be the cause of his symptoms.  Recommendations -Continue video EEG monitoring for characterization of spells -Discussed with RN to remove condom catheter if possible and allow patient to get up and walk around safely because that is usually what triggers the episodes for him. -Not on any AEDs at this point.  Will consider starting valproic acid if we see any ictal-interictal abnormality -Discussed with the EP team that patient will likely need to continue video EEG monitoring for another 24 to 48 hours.  Most likely plan for pacemaker implantation next week. -Continue seizure precautions  Ativan 2 mg as needed for generalized tonic-clonic seizure lasting more than 2 minutes for focal seizure lasting more than 5 minutes -Management of rest of comorbidities per primary team  I have spent a total of35  minuteswith the patient reviewing hospitalnotes,  test results, labs and examining the patient as well as establishing an assessment and plan that was discussed personally with the patient.>50% of time was spent in direct patient care.    Dustin Hamilton Epilepsy [facility] For questions after 5pm please refer to AMION to reach the  Neurologist on call

## 2020-05-24 NOTE — Consult Note (Signed)
Cardiology Consultation:   Patient ID: Dustin Hamilton MRN: [id number]; DOB: Sep 25, 1940  Admit date: 05/22/2020 Date of Consult: 05/24/2020  Primary Care Provider: Clinic, [place] Cardiologist: Quay Burow, MD  [facility] Electrophysiologist:  Cristopher Peru, MD    Patient Profile:   Dustin Hamilton is a 79 y.o. male with a hx of prior CVA, HTN, SVT (AVNRT ablated 2020), recurrent dizzy spells and syncope who is being seen today for the evaluation of CHB at the request of Dr. Andria Frames.  History of Present Illness:   Dustin Hamilton was seen during his last hospitalization and mentioned that his h/u these spells go back over 5 years, when asked how many times he had fainted with them , said that was too many to count.  The events came in different forms, and had been evaluated by many doctors ove the years and many exams, tests, monitors. Of late from a cardiac perspective 02/02/20 for 2 weeks.  He had a number of patient triggered events with symptoms of lightheaded, dizzy, and at least one reported fainting all with SR, normal HRs He did have 2 episodes of Mobiz I block with 1 dropped beat each and one short pause though these were early AM, and not associated with symptoms. After this monitor his betablocker was stopped It was felt at that time his symptoms did  Not align with the bradycardic episodes on the monitor and planned to follow up on his repeat off BB 11/17- 05/18/20 monitor noted 1. SR/SB/ST 2. Occasional PACs and PVCs 3. Short runs of SVT 4. CHB with up to 8 second pause. And was given an urgent EP follow up.  He was also having ongoing neurology w/u outpatient and suspect to have seizures, an anti seizure medicine was initated though unable to tolerate it.  He was at the office 12/21 to see Dr. Quentin Ore when he had an observed syncopal event, the patient was repetitively saying that he was hot and trying to take off his shirt. Shortly after  I arrived he stopped responding and we got him back to the treatment room. His initial blood pressures were low with systolics in the 0000000. We hooked him up to a twelve-lead EKG which showed a tachycardia of approximately 130 bpm. It appeared to be a atrial tachycardia with intermittent sinus beats. I did not observe any bradycardic episodes. EMS was called and he was [facility]. With recommendation for medicine/neurology to admit and cardiology would follow with need for eventual PPM, though his CHB/pauses not felt to be the answer to his symptoms given frequency of his spells are near daily, sometimes more then one a day, his CBH/brady episodes certainly were not of that frequency.  In fact noted that the 8 second pause was not associated with a patient triggered event and when there was a pt triggered event his HR was wnl.  Neurology work up continues, and continue to suspect seizures.  He continues on continues EEG. This morning he was with PT and had a spell, he recalls once they had him seated at the edge of the bed he began feelinng dizzy with worsening symptoms was helped back to laying position abnd eventually began to feel better. In d/w PT the event occurred about 0830, the patient verbalized feeling dizzy, as they spoke, the patient said was starting to get worse, and then stopped responding, he was help back to laying position in bed, and after several seconds started to answer him, though  took about a minute for the patient to be able to tell him his name.     Past Medical History:  Diagnosis Date  . BPH (benign prostatic hyperplasia)   . Cerebrovascular disease   . Congenital anomaly of diaphragm   . Elevated PSA   . Glaucoma, both eyes   . Hemorrhoid   . Hepatitis B surface antigen positive    02-20-2011  . History of adenomatous polyp of colon    2007, 2009 and 2013  tubular adenoma's  . History of alcohol abuse    quit 1963  . History of cerebral parenchymal  hemorrhage    01/ 2006  left occiptial lobe related to hypertensive crisis  . History of CVA (cerebrovascular accident)    09-12-2012  left hippocampus/ amygdala junction and per MRI old white matter infarcts--  per pt residual short- term memory issues  . History of fatty infiltration of liver hx visit's at [facility] , last visit 05/ 2014   elvated LFT's ,  via liver bx 2004 related to hx alcohol and drug abuse (quit 1964)  . 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 HTN (hypertension)   . Renal cyst, left   . Stroke ([place])    hx of 3 strokes in past   . 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 Urethral lesion    urethral mass    Past Surgical History:  Procedure Laterality Date  . CARDIOVASCULAR STRESS TEST  05/05/2007   normal nuclear study w/ no ischemia/  normal LV fucntion and wall motion , ef60%  . COLONOSCOPY  last one 04-06-2012  . CYSTO/  LEFT RETROGRADE PYELOGRAM/ CYTOLOGY WASHINGS/  URETEROSCOPY  03/05/2000  . INGUINAL HERNIA REPAIR Bilateral 1965 and 1980's  . LAPAROSCOPIC INGUINAL HERNIA WITH UMBILICAL HERNIA Right [id number]  . LIVER BIOPSY  1980's and 2004  . SVT ABLATION N/A 11/15/2018   Procedure: SVT ABLATION;  Surgeon: Evans Lance, MD;  Location: [place] [facility];  Service: Cardiovascular;  Laterality: N/A;  . TRANSTHORACIC ECHOCARDIOGRAM  09/13/2012   moderate LVH,  ef 60-65%/    . TRANSURETHRAL RESECTION OF BLADDER TUMOR N/A 08/11/2016   Procedure: TRANSURETHRAL RESECTION OF BLADDER TUMOR (TURBT);  Surgeon: Cleon Gustin, MD;  Location: [facility];  Service: Urology;  Laterality: N/A;     Home Medications:  Prior to Admission medications   Medication Sig Start Date End Date Taking? Authorizing Provider  amLODipine (NORVASC) 5 MG tablet Take 5 mg by mouth daily.   Yes [provider]  apixaban (ELIQUIS) 5 MG TABS tablet Take 1 tablet (5 mg total) by mouth 2  (two) times daily. 05/06/20 06/05/20 Yes Lattie Haw, MD  brimonidine (ALPHAGAN) 0.2 % ophthalmic solution Place 1 drop into both eyes 2 (two) times daily.   Yes [provider]  dutasteride (AVODART) 0.5 MG capsule Take 0.5 mg by mouth daily.   Yes [provider]  latanoprost (XALATAN) 0.005 % ophthalmic solution Place 1 drop into both eyes at bedtime.    Yes [provider]  meclizine (ANTIVERT) 25 MG tablet Take 25 mg by mouth 3 (three) times daily as needed for dizziness.   Yes [provider]  memantine (NAMENDA) 5 MG tablet Take 17.5 mg by mouth at bedtime.    Yes [provider]  tamsulosin (FLOMAX) 0.4 MG CAPS capsule Take 0.4 mg by mouth at  bedtime.    Yes [provider]  levETIRAcetam (KEPPRA XR) 500 MG 24 hr tablet Take 1 tablet (500 mg total) by mouth every evening. Patient not taking: Reported on 05/23/2020 05/14/20   Penni Bombard, MD    Inpatient Medications: Scheduled Meds: . brimonidine  1 drop Both Eyes BID  . dutasteride  0.5 mg Oral Daily  . latanoprost  1 drop Both Eyes QHS  . memantine  17.5 mg Oral QHS   Continuous Infusions: . sodium chloride 117 mL/hr at 05/24/20 0907   PRN Meds: acetaminophen **OR** acetaminophen, polyethylene glycol  Allergies:    Allergies  Allergen Reactions  . Penicillins Hives    Social History:   Social History   Socioeconomic History  . Marital status: Married    Spouse name: [name]  . Number of children: 1  . Years of education: College  . Highest education level: Not on file  Occupational History  . Occupation: Geophysicist/field seismologist  Tobacco Use  . Smoking status: Former Smoker    Packs/day: 1.00    Years: 5.00    Pack years: 5.00    Types: Cigarettes    Quit date: 11/30/1981    Years since quitting: 38.5  . Smokeless tobacco: Never Used  Vaping Use  . Vaping Use: Never used  Substance and Sexual Activity  . Alcohol use: No    Alcohol/week: 0.0 standard drinks     Comment: hx abuse -- quit:  1963  . 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 Sexual activity: Not on file  Other Topics Concern  . Not on file  Social History Narrative   Patient lives at home with his spouse.   Caffeine Use:  Tea, lots   Social Determinants of Health   Financial Resource Strain: Not on file  Food Insecurity: Not on file  Transportation Needs: Not on file  Physical Activity: Not on file  Stress: Not on file  Social Connections: Not on file  Intimate Partner Violence: Not on file    Family History:   Family History  Problem Relation Age of Onset  . Rheum arthritis Mother   . Diabetes Mother   . Stroke Mother   . Heart attack Mother   . Kidney failure Mother   . Heart attack Father   . Heart disease Maternal Grandmother   . Rheum arthritis Maternal Grandmother   . Diabetes Maternal Grandmother   . Stroke Maternal Grandmother   . Colon cancer Neg Hx      ROS:  Please see the history of present illness.  All other ROS reviewed and negative.     Physical Exam/Data:   Vitals:   05/23/20 1955 05/23/20 2334 05/24/20 0334 05/24/20 0747  BP: (!) 157/90 (!) 155/86 (!) 135/91 105/77  Pulse: 79 77 81 86  Resp: 18 16 16 18   Temp: 98.1 F (36.7 C) 98 F (36.7 C) 97.9 F (36.6 C) 98.1 F (36.7 C)  TempSrc: Oral Oral Oral Oral  SpO2: 98% 99% 98%   Weight:      Height:        Intake/Output Summary (Last 24 hours) at 05/24/2020 1053 Last data filed at 05/24/2020 0900 Gross per 24 hour  Intake 360 ml  Output 2850 ml  Net -2490 ml   Last 3 Weights 05/22/2020 05/14/2020 05/05/2020  Weight (lbs) 169 lb 15.6 oz 170 lb 163 lb  Weight (kg) 77.1 kg 77.111 kg 73.936 kg  Body mass index is 23.71 kg/m.  General:  Well nourished, well developed, in no acute distress HEENT: normal Lymph: no adenopathy Neck: no JVD Endocrine:  No thryomegaly Vascular: No carotid bruits  Cardiac: RRR; no murmurs, gallops or rubs Lungs: CTA  b/l, no wheezing, rhonchi or rales  Abd: soft, nontender Ext: no edema Musculoskeletal:  No deformities Skin: warm and dry  Neuro:   No gross focal abnormalities noted Psych:  Normal affect   EKG:  The EKG was personally reviewed and demonstrates:    #1 is SR 98bpm, 1st degree AVblock PR 269ms, IVCD, LAD #2 SR 83bpm, 1st degree AVBlock 280ms, IVCD, LAD  Telemetry:  Telemetry was personally reviewed and demonstrates:   SR, this morning at about 0930 he had an episode of 2:1 AVblock with very transient V rates 40's  Relevant CV Studies:  05/23/2020: TTE IMPRESSIONS  1. Left ventricular ejection fraction, by estimation, is 60 to 65%. The  left ventricle has normal function. The left ventricle has no regional  wall motion abnormalities. There is mild asymmetric left ventricular  hypertrophy of the basal-septal segment.  Left ventricular diastolic parameters are consistent with Grade I  diastolic dysfunction (impaired relaxation).  2. Right ventricular systolic function is normal. The right ventricular  size is mildly enlarged.  3. A small pericardial effusion is present. The pericardial effusion is  circumferential. There is no evidence of cardiac tamponade.  4. The mitral valve is grossly normal. No evidence of mitral valve  regurgitation. No evidence of mitral stenosis.  5. The aortic valve is tricuspid. There is mild calcification of the  aortic valve. Aortic valve regurgitation is not visualized. Mild aortic  valve sclerosis is present, with no evidence of aortic valve stenosis.  6. The inferior vena cava is normal in size with greater than 50%  respiratory variability, suggesting right atrial pressure of 3 mmHg.   Comparison(s): No significant change from prior study.    05/04/2020: TTE IMPRESSIONS  1. The LV images are technically difficult , even with Definity contrast  . . Left ventricular ejection fraction, by estimation, is 55 to 60%. The  left ventricle has  normal function. The left ventricle has no regional  wall motion abnormalities. Left  ventricular diastolic parameters are consistent with Grade I diastolic  dysfunction (impaired relaxation).  2. Right ventricular systolic function is normal. The right ventricular  size is moderately enlarged.  3. A small pericardial effusion is present. There is no evidence of  cardiac tamponade.  4. The mitral valve is grossly normal. Trivial mitral valve  regurgitation.  5. The aortic valve was not well visualized. Aortic valve regurgitation  is not visualized. No aortic stenosis is present.   02/2020: 2 week monitor 1. SR/SB 2. Occasional PACs/PVCs 3. Second degree AVB 4. 3.2 sec pause  11/15/2018: EPS/Ablation CONCLUSIONS:  1. Sinus rhythm upon presentation.  2. 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 Successful radiofrequency modification of the slow AV nodal pathway  4. No inducible arrhythmias following ablation.  5. No early apparent complications.   06/12/2017: stress myoview  Nuclear stress EF: 63%.  The left ventricular ejection fraction is normal (55-65%).  The study is normal.  This is a low risk study.   Laboratory Data:  High Sensitivity Troponin:   Recent Labs  Lab 05/03/20 1400 05/03/20 1601 05/04/20 0626 05/22/20 1956 05/22/20 2322  TROPONINIHS 9 10 16  26* 27*     Chemistry Recent Labs  Lab 05/22/20 1956 05/23/20 0353 05/24/20 0317  NA 141 139 138  K 3.6 3.1* 3.6  CL 108 106 105  CO2 21* 23 23  GLUCOSE 97 171* 150*  BUN 21 16 22   CREATININE 1.08 0.90 1.43*  CALCIUM 9.5 9.2 9.1  GFRNONAA >60 >60 50*  ANIONGAP 12 10 10     Recent Labs  Lab 05/22/20 1956  PROT 7.2  ALBUMIN 3.6  AST 16  ALT 28  ALKPHOS 53  BILITOT 0.7   Hematology Recent Labs  Lab 05/22/20 1956 05/23/20 0353 05/24/20 0317   WBC 9.2 8.1 8.5  RBC 5.62 5.19 4.83  HGB 17.6* 16.6 15.0  HCT 52.9* 48.2 44.9  MCV 94.1 92.9 93.0  MCH 31.3 32.0 31.1  MCHC 33.3 34.4 33.4  RDW 14.5 14.5 14.3  PLT 153 147* 141*   BNPNo results for input(s): BNP, PROBNP in the last 168 hours.  DDimer No results for input(s): DDIMER in the last 168 hours.   Radiology/Studies:  DG Chest 1 View Result Date: 05/22/2020 CLINICAL DATA:  Syncope EXAM: CHEST  1 VIEW COMPARISON:  May 03, 2020 FINDINGS: The heart size and mediastinal contours are within normal limits. Both lungs are clear. The visualized skeletal structures are unremarkable. There is stable elevation of the right hemidiaphragm. IMPRESSION: No active disease. Electronically Signed   By: Constance Holster M.D.   On: 05/22/2020 18:08    Overnight EEG with video Result Date: 05/23/2020 Lora Havens, MD     05/24/2020  9:41 AM Patient Name: Dustin Hamilton MRN: [id number] Epilepsy Attending: Lora Havens Referring Physician/Provider: Dr Donnetta Simpers Duration: 05/22/2020 2146 to 05/23/2020 2146  Patient history: 78 y.o.malewith PMH significant for prior stroke in 2014, hx of SVT s/p ablation 6/20, hx of AV block, mild dementia whopresents today for evaluation of multiple spells that are clinically difficult to determine if they are partial complex seizures.The semiology of the event is as follows "leans forward -> expressive aphasia with intact awareness -> feels warm -> no post ictal confusion". He has about 2 events a day. EEG to evaluate for seizure  Level of alertness: Awake, asleep  AEDs during EEG study: None  Technical aspects: This EEG study was done with scalp electrodes positioned according to the 10-20 International system of electrode placement. Electrical activity was acquired at a sampling rate of 500Hz  and reviewed with a high frequency filter of 70Hz  and a low frequency filter of 1Hz . EEG data were recorded continuously and digitally stored.   Description: The posterior dominant rhythm consists of 9-10 Hz activity of moderate voltage (25-35 uV) seen predominantly in posterior head regions, symmetric and reactive to eye opening and eye closing.  Sleep was characterized by vertex waves, sleep spindles (12-14hz ), maximal  frontoHyperventilation and photic stimulation were not performed.    IMPRESSION: This study is within normal limits. No seizures or epileptiform discharges were seen throughout the recording.  Lora Havens   Portable EEG Result Date: 05/23/2020 Lora Havens, MD     05/23/2020  9:27 AM Patient Name: Dustin Hamilton MRN: [id number] Epilepsy Attending: Lora Havens Referring Physician/Provider: Dr Silvio Pate Date: 05/22/2020 Duration: 21.50 mins Patient history: 79 y.o. male with PMH significant for prior stroke in 2014, hx of SVT s/p ablation 6/20, hx of AV block, mild dementia who presents today for evaluation of multiple spells that are clinically difficult to determine if  they are partial complex seizures. The semiology of the event is as follows "leans forward -> expressive aphasia with intact awareness -> feels warm -> no post ictal confusion". He has about 2 events a day. EEG to evaluate for seizure Level of alertness: Awake AEDs during EEG study: None Technical aspects: This EEG study was done with scalp electrodes positioned according to the 10-20 International system of electrode placement. Electrical activity was acquired at a sampling rate of 500Hz  and reviewed with a high frequency filter of 70Hz  and a low frequency filter of 1Hz . EEG data were recorded continuously and digitally stored. Description: The posterior dominant rhythm consists of 9-10 Hz activity of moderate voltage (25-35 uV) seen predominantly in posterior head regions, symmetric and reactive to eye opening and eye closing.  Hyperventilation and photic stimulation were not performed.   IMPRESSION: This study is within normal limits. No  seizures or epileptiform discharges were seen throughout the recording. Priyanka O Yadav    Assessment and Plan:   1. Conduction system disease, including CHB     He will need a PPM, though this is not the etiology of all of his symptoms.      Dr. Quentin Ore has seen the patient today and discussed with him eventual pacer once neurology eval and w/u is completed, or at a place we can interrupt and suspect he will get pacer next week.  The patient was ok with that, and comfortable with the plan. He also spoke with Dr. Hortense Ramal This morning' symptom did not translate to epileptic findings on EEG, though would like to continue ongoing/continuous EEG monitoring and further evaluation ?vertigo since the patient reports movement is often a trigger. Planned for mobilization and another 24-48hours of EEG monitoring.  Avoid all potential nodal blocking agents until pacer is implanted Will order orthostatic vitals  EP will continue to follow  2. AFlutter     This finding seems to hev been noted at his last hospital stay.    In review of notes, cardiology note states he had an episode of AFlutter the day prior and was satrted on [facility] In review of CV strips available and dated 05/04/20 I am not convinced are true AFlutter but artifact,  I have reviewed these with Dr. Quentin Ore who is in agreement.     Stop Eliquis for now     Once he has his pacer we will have ability to monitor this better going forward     I have ordered SCD for DVT prophylaxis, and looks like he is going to start mobilization as well.    { For questions or updates, please contact [place] Please consult [email or web address] for contact info under    Signed, Baldwin Jamaica, PA-C  05/24/2020 10:53 AM

## 2020-05-24 NOTE — Progress Notes (Signed)
Rehab Admissions Coordinator Note:  Patient was screened by Cleatrice Burke for appropriateness for an Inpatient Acute Rehab Consult per OT recs. PT recommends Home with HH. If patient fails to progress in the next 24 to 48, would consider request for an inpt rehab consult.  Cleatrice Burke RN MSN 05/24/2020, 1:59 PM  I can be reached at [phone].

## 2020-05-24 NOTE — Progress Notes (Signed)
LTM maint complete - no skin breakdown under: F3,F8. Activations complete

## 2020-05-24 NOTE — Progress Notes (Signed)
Family Medicine Teaching Service Daily Progress Note Intern Pager: [phone]  Patient name: Dustin Hamilton Medical record number: [id number] Date of birth: 1940/09/08 Age: 79 y.o. Gender: male  Primary Care Provider: [facility] Consultants: Neurology, Cardiology  Code Status: Full   Pt Overview and Major Events to Date:  12/21 Admitted   Assessment and Plan: 12/21 Admitted  12/22 EEG completed   Assessment and Plan: Dustin Hamilton a 79 y.o.malepresenting by EMS s/pseizure-likeepsiode. PMH is significant forCVA in 2014, HTN, pSVT with AVNRT ablation in 11/2018, Mobitz Type 1 AV block, dizziness.  Seizure: acute, resolved Patient with a history of seizures and followed by neurologist,Dr. [name].EEG within normal limits- did not show any seizures or epileptiform discharges to clarify the driving factor for heart block, whether primary cardiac pathology vs a vagal response to a neurologic event. Per cards, patient will likely be planned for pacemaker in a week. Patient had another episode when working with PT this morning lasting about 10 seconds and reported some dizziness after which typically occurs after his spells for about 5-57minutes - Neurology following, appreciate recommendations -Cardiology following, appreciate recommendations - Continuous cardiac monitoring, pulse ox - Orthostatic vital signs - AM CBC, BMP - PT/OT eval and treat - Seizure precautions/Fall precautions  - cEEG wnl, continuing for another 24 hours  - Ativan 2mg  prn for generalized tonic-clonic seizure lasting > 2 min, or focal seizure lasting >27min  - Neuro considering starting valproic acid if ictal-interictal abnormalities seen on EEG  Right Ankle Pain: acute, stable Patient notes right ankle pain that began about 4 days ago. Denies any injury, trauma to the area. Full range of motion, 5 out of 5 strength. Ankle x-ray in ED negative for fracture. Pain on palpation to  area lateral to Achilles tendon and posterior to lateral malleolus.Most likely differential includes anterior talofibular sprain.  - PT eval and treat  Complete heart block Recent ZIO monitor showed multiple episodes of complete heart block with 8-9 seconds without ventricular contraction (3 episodes over 2 weeks) Potentially related to syncope  - eventual permanent pacemaker implant per cards - continuous cardiac monitoring    Atrial Flutter  On Eliquis 5 mg BID. Echo 12/03, with EF 89-16%, grade 1 diastolic dysfunction but no regional wall abnormalities.  - Continue home Eliquis  HTN: chronic, stable On Amlodipine 5 mg. BP elevated while in ED with the highest pressure being 200/116 which improved to 945'W systolic. Per chart review it appears patient was recommended to increase to 10 mg but has not. Would likely benefit from additional anti-HTN medications. - held amlodipine 5mg  due to soft BPs today. Can resume tomorrow  - Vitals per floor protocol   Hx Polycythemia Hx of Thrombocytopenia Currently resolved. Polycythemic at 17.6 >16.6 >15. Most recent baseline appears to be 15-16. Per chart review, patient with history of polycythemia as far back as 2008 and as high as 18.7 in 2017. Hx of thrombocytopenia on prior admission.Platelets on admission 153   - Monitor with daily CBC  - Consider serum EPO vs outpatient work up  BPH: chronic, stable On Dutasteride 0.5 mg daily and Tamsulosin 0.4 mg daily. Patient with positive orthostatics on previous admission. - Continue Dutasteride 0.5 mgevery morning - Holding Tamsulosin 0.4 mg  History of CVA in 2014 Followed by Neurologist, Dr. [name].Previously on Plavix and ASA but per wife, was recently transitioned to just Eliquis. - PT/OT eval and treat -Continue Eliquis 5 mg BID  Glaucoma: chronic, stable Vision difficulties in left  eye. - Continue Brimonidine 0.2% ophthalmic solution 1 drop both eyes BID  - Continue  Latanoprost 0.005% ophthalmic solution, 1 drop both eyes QHS  Mild dementia: chronic, stable Follows with Neurology, as above. On Namenda 17.5mg  QHS - Continue Namendanightly  FEN/GI: regular diet  PPx: Eliquis   Disposition: Med-tele. Dispo pending Cards and Neuro   Subjective:   This morning when patient was working with PT he experienced one of his episodes where he stares blankly for about 10 seconds and felt some dizziness after. He was disconnected from any monitoring at the time. When I examined him this morning he denied any dizziness, chest pain, palpitations. Felt well with no complaints   Objective: Temp:  [97.9 F (36.6 C)-98.1 F (36.7 C)] 98.1 F (36.7 C) (12/23 0747) Pulse Rate:  [67-90] 86 (12/23 0747) Resp:  [15-23] 18 (12/23 0747) BP: (105-159)/(73-104) 105/77 (12/23 0747) SpO2:  [94 %-99 %] 98 % (12/23 0334) Physical Exam: General: well appearing, pleasant, NAD Cardiovascular: RRR no murmurs Respiratory: CTAB. Normal WOB Abdomen: soft, non-distended, non-tender  Extremities: moving spontaneously   Laboratory: Recent Labs  Lab 05/22/20 1956 05/23/20 0353 05/24/20 0317  WBC 9.2 8.1 8.5  HGB 17.6* 16.6 15.0  HCT 52.9* 48.2 44.9  PLT 153 147* 141*   Recent Labs  Lab 05/22/20 1956 05/23/20 0353 05/24/20 0317  NA 141 139 138  K 3.6 3.1* 3.6  CL 108 106 105  CO2 21* 23 23  BUN 21 16 22   CREATININE 1.08 0.90 1.43*  CALCIUM 9.5 9.2 9.1  PROT 7.2  --   --   BILITOT 0.7  --   --   ALKPHOS 53  --   --   ALT 28  --   --   AST 16  --   --   GLUCOSE 97 171* 150*     Imaging/Diagnostic Tests:  None new   Shary Key, DO 05/24/2020, 8:59 AM PGY-1, [place] Intern pager: [phone], text pages welcome

## 2020-05-24 NOTE — Evaluation (Signed)
Physical Therapy Evaluation Patient Details Name: Dustin Hamilton MRN: [id number] DOB: [birth year] Today's Date: 05/24/2020   History of Present Illness  79 y.o. male presenting by EMS s/p seizure-like epsiode. PMH is significant for CVA, HTN, pSVT with AVNRT ablation in 11/2018, Mobitz Type 1 AV block, dizziness.  Clinical Impression  Pt presents to PT with deficits in functional mobility, endurance, and with brief period of reduced responsiveness as documented below. PT session is limited by episode of reduced responsiveness. Pt mobilizes well to edge of bed and is able to demonstrate good strength in all extremities. Pt has good coordination and mobility of all limbs even after episode. Pt will benefit from further assessment of gait and mobility as medically appropriate. PT anticipates discharge home as the pt moved well at bed level, however pt will require further assessment of gait and standing balance prior to discharge.    Follow Up Recommendations Home health PT;Supervision/Assistance - 24 hour (pending pt progress)    Equipment Recommendations  None recommended by PT    Recommendations for Other Services       Precautions / Restrictions Precautions Precautions: Fall Precaution Comments: syncope vs seizure-like episodes Restrictions Weight Bearing Restrictions: No      Mobility  Bed Mobility Overal bed mobility: Needs Assistance Bed Mobility: Supine to Sit;Sit to Supine     Supine to sit: Supervision Sit to supine: Total assist (due to impaired responsiveness)        Transfers                 General transfer comment: deferred 2/2 episode of impaired responsiveness  Ambulation/Gait                Stairs            Wheelchair Mobility    Modified Rankin (Stroke Patients Only)       Balance Overall balance assessment: Needs assistance Sitting-balance support: No upper extremity supported;Feet supported Sitting balance-Leahy Scale:  Good                                       Pertinent Vitals/Pain Pain Assessment: No/denies pain    Home Living Family/patient expects to be discharged to:: Private residence Living Arrangements: Spouse/significant other Available Help at Discharge: Family;Available 24 hours/day Type of Home: House Home Access: Ramped entrance     Home Layout: Two level;Full bath on main level;Bed/bath upstairs;Able to live on main level with bedroom/bathroom Home Equipment: Shower seat;Walker - 2 wheels;Walker - 4 wheels;Walker - standard;Bedside commode;Grab bars - toilet;Grab bars - tub/shower;Hand held shower head;Electric scooter;Wheelchair - manual      Prior Function Level of Independence: Needs assistance   Gait / Transfers Assistance Needed: Walks with supervision and rollator. Uses scooter and rollator to enter/exit home on ramp.  ADL's / Homemaking Assistance Needed: Independent with grooming and dressing. Assistance for cooking.        Hand Dominance        Extremity/Trunk Assessment   Upper Extremity Assessment Upper Extremity Assessment: Overall WFL for tasks assessed    Lower Extremity Assessment Lower Extremity Assessment: Overall WFL for tasks assessed    Cervical / Trunk Assessment Cervical / Trunk Assessment: Normal  Communication   Communication: No difficulties;Expressive difficulties (pt with some expressive difficulties after episode of impaired responsiveness, returns to baseline within 2-3 minutes of episode)  Cognition Arousal/Alertness: Awake/alert Behavior During Therapy: [facility] for  tasks assessed/performed Overall Cognitive Status: Impaired/Different from baseline Area of Impairment: Problem solving                             Problem Solving: Slow processing General Comments: pt seems WFL during session until episode of reduced responsiveness. Pt then with some slowed processing which gradually returns to baseline within 2-3  minute period      General Comments General comments (skin integrity, edema, etc.): pt with episode of impaired responsiveness, initially [facility] cognitively then transitions to sitting edge of bed and begins to report dizziness. Pt reports some worsening of dizziness within first minute of sitting. PT attempting to obtain additional information about the pt's symptoms when the pt becomes unresponsive, eyes closed with downward head posture. PT returns the pt to supine, upon which the pt becomes alert and responsive within 10 seconds. Pt initially with difficulty reporting his name, but within 2 minutes is alert and oriented x4, back to baseline at the beginning of session. RN and Dr. Arby Barrette notified    Exercises     Assessment/Plan    PT Assessment Patient needs continued PT services  PT Problem List Decreased activity tolerance;Decreased balance;Decreased mobility;Decreased cognition       PT Treatment Interventions DME instruction;Functional mobility training;Gait training;Therapeutic exercise;Therapeutic activities;Balance training;Neuromuscular re-education;Patient/family education    PT Goals (Current goals can be found in the Care Plan section)  Acute Rehab PT Goals Patient Stated Goal: to return to independence and reduce frequency of episodes PT Goal Formulation: With patient Time For Goal Achievement: 06/07/20 Potential to Achieve Goals: Good    Frequency Min 3X/week   Barriers to discharge        Co-evaluation               AM-PAC PT "6 Clicks" Mobility  Outcome Measure Help needed turning from your back to your side while in a flat bed without using bedrails?: A Little Help needed moving from lying on your back to sitting on the side of a flat bed without using bedrails?: A Little Help needed moving to and from a bed to a chair (including a wheelchair)?: A Lot Help needed standing up from a chair using your arms (e.g., wheelchair or bedside chair)?: A Lot Help  needed to walk in hospital room?: A Lot Help needed climbing 3-5 steps with a railing? : A Lot 6 Click Score: 14    End of Session   Activity Tolerance: Treatment limited secondary to medical complications (Comment) (episode of reduced responsiveness) Patient left: in bed;with call bell/phone within reach;with bed alarm set Nurse Communication: Mobility status;Precautions (seizure) PT Visit Diagnosis: Other abnormalities of gait and mobility (R26.89);Dizziness and giddiness (R42);Other symptoms and signs involving the nervous system (R29.898)    Time: 7209-4709 PT Time Calculation (min) (ACUTE ONLY): 15 min   Charges:   PT Evaluation $PT Eval Moderate Complexity: 1 Mod          Zenaida Niece, PT, DPT Acute Rehabilitation Pager: [phone]   Zenaida Niece 05/24/2020, 10:32 AM

## 2020-05-24 NOTE — Procedures (Addendum)
Patient Name:Dustin Hamilton PYK:[id number] Epilepsy Attending:Nhi Butrum Barbra Sarks Referring Physician/Provider:Dr Donnetta Simpers Duration:05/23/2020 2146 to 05/24/2020 2146  Patient history:79 y.o.malewith PMH significant for prior stroke in 2014, hx of SVT s/p ablation 6/20, hx of AV block, mild dementia whopresents today for evaluation of multiple spells that are clinically difficult to determine if they are partial complex seizures.The semiology of the event is as follows "leans forward -> expressive aphasia with intact awareness -> feels warm -> no post ictal confusion". He has about 2 events a day.EEG to evaluate for seizure  Level of alertness:Awake, asleep  AEDs during EEG study:None  Technical aspects: This EEG study was done with scalp electrodes positioned according to the 10-20 International system of electrode placement. Electrical activity was acquired at a sampling rate of 500Hz  and reviewed with a high frequency filter of 70Hz  and a low frequency filter of 1Hz . EEG data were recorded continuously and digitally stored.   Description: The posterior dominant rhythm consists of 9-10 Hz activity of moderate voltage (25-35 uV) seen predominantly in posterior head regions, symmetric and reactive to eye opening and eye closing. Sleep was characterized by vertex waves, sleep spindles (12-14hz ), maximal  frontoHyperventilation and photic stimulation were not performed.   Event button was pressed on 05/24/2020 at 0828 during which patient was told to get out of his bed for physical therapy and felt dizzy, not responding.  He was eventually asked to lay down in bed and episode resolved after about 10 minutes.  Concomitant EEG before, during and after the event did not show any EEG to suggest seizure.  IMPRESSION: This study is within normal limits. No seizures or epileptiform discharges were seen throughout the recording.  One event was recorded on 05/24/2020 at  01/19/2017 during which patient reported feeling dizzy, not responding, lasted about 10 minutes without concomitant EEG change and was not an epileptic event.  Tonie Vizcarrondo Barbra Sarks

## 2020-05-24 NOTE — Evaluation (Signed)
Occupational Therapy Evaluation Patient Details Name: Dustin Hamilton MRN: [id number] DOB: 1940-12-16 Today's Date: 05/24/2020    History of Present Illness 79 y.o. male presenting by EMS s/p seizure-like epsiode. PMH is significant for CVA, HTN, pSVT with AVNRT ablation in 11/2018, Mobitz Type 1 AV block, dizziness.   Clinical Impression   This 79 yo male admitted with above presents to acute OT today with dizziness upon first sitting up (causing him to lean to his left)--he did not pass out, but did slow in his responses to me (verbal and movement). Had him sit EOB and work on moving his legs and arms and answering questions (monitoring how he did with these)--dizziness abated totally after ~ 5 minutes. He then stood with me and took 4 steps up towards the [facility] (no dizziness reported at all with this). Prior to this admission he was totally independent with basic ADLs.He will continue to benefit from acute OT with follow up OT on acute. He reported after standing and side stepping, "I'm going to have to learn to walk all over again" since he felt so off balance.    Follow Up Recommendations  CIR (pt very unsteady on his feet))    Equipment Recommendations  Other (comment) (TBD next venue)       Precautions / Restrictions Precautions Precaution Comments: syncope vs seizure-like episodes Restrictions Weight Bearing Restrictions: No      Mobility Bed Mobility Overal bed mobility: Needs Assistance Bed Mobility: Supine to Sit;Sit to Supine     Supine to sit: Min guard Sit to supine: Min guard        Transfers Overall transfer level: Needs assistance Equipment used: 1 person hand held assist Transfers: Sit to/from Stand Sit to Stand: Min assist         General transfer comment: Min A to side step towards [facility]    Balance Overall balance assessment: Needs assistance Sitting-balance support: Bilateral upper extremity supported;Feet supported   Sitting balance - Comments:  fair to poor with left lateral lean when poor   Standing balance support: Bilateral upper extremity supported Standing balance-Leahy Scale: Poor                             ADL either performed or assessed with clinical judgement   ADL Overall ADL's : Needs assistance/impaired Eating/Feeding: Independent;Bed level   Grooming: Min guard;Sitting Grooming Details (indicate cue type and reason): EOB Upper Body Bathing: Min guard;Sitting Upper Body Bathing Details (indicate cue type and reason): EOB Lower Body Bathing: Moderate assistance Lower Body Bathing Details (indicate cue type and reason): Min A sit<>stand Upper Body Dressing : Moderate assistance;Sitting Upper Body Dressing Details (indicate cue type and reason): EOB Lower Body Dressing: Moderate assistance Lower Body Dressing Details (indicate cue type and reason): min A sit<>stand   Toilet Transfer Details (indicate cue type and reason): Min A sit<>stand and side step up towards [place] and Hygiene: Minimal assistance;Sit to/from stand               Vision Baseline Vision/History: Wears glasses Wears Glasses: Reading only Patient Visual Report: Other (comment) (glaucoma and cataracts) Vision Assessment?: No apparent visual deficits            Pertinent Vitals/Pain Pain Assessment: No/denies pain     Hand Dominance Right   Extremity/Trunk Assessment Upper Extremity Assessment Upper Extremity Assessment: Overall WFL for tasks assessed  Communication Communication Communication: No difficulties;Expressive difficulties (when pt reported 'a little dizziness" x 2 during session he was not as talkative)   Cognition Arousal/Alertness: Awake/alert Behavior During Therapy: WFL for tasks assessed/performed Overall Cognitive Status: Impaired/Different from baseline Area of Impairment: Problem solving                             Problem Solving: Slow  processing General Comments: pt seems WFL during session until episode dizziness, pt then with some slowed processing which turned to baseline in less than 1 minute (per 2 episodes he had while seated EOB)              Home Living Family/patient expects to be discharged to:: Private residence Living Arrangements: Spouse/significant other Available Help at Discharge: Family;Available 24 hours/day Type of Home: House Home Access: Ramped entrance     Home Layout: Two level;Full bath on main level;Bed/bath upstairs;Able to live on main level with bedroom/bathroom Alternate Level Stairs-Number of Steps: flight ((? has elevator))   Bathroom Shower/Tub: Walk-in shower;Door   [employer] Toilet: Standard Bathroom Accessibility: Yes   Home Equipment: Clinical cytogeneticist - 2 wheels;Walker - 4 wheels;Walker - standard;Bedside commode;Grab bars - toilet;Grab bars - tub/shower;Hand held shower head;Electric scooter;Wheelchair - manual          Prior Functioning/Environment Level of Independence: Needs assistance  Gait / Transfers Assistance Needed: Walks with supervision and rollator. Uses scooter and rollator to enter/exit home on ramp. ADL's / Homemaking Assistance Needed: Independent with grooming and dressing. Assistance for cooking.            OT Problem List: Impaired balance (sitting and/or standing);Decreased safety awareness;Decreased knowledge of use of DME or AE      OT Treatment/Interventions: Self-care/ADL training;DME and/or AE instruction;Patient/family education;Balance training;Therapeutic activities    OT Goals(Current goals can be found in the care plan section) Acute Rehab OT Goals Patient Stated Goal: to not have episodes OT Goal Formulation: With patient Time For Goal Achievement: 06/07/20 Potential to Achieve Goals: Good  OT Frequency: Min 2X/week              AM-PAC OT "6 Clicks" Daily Activity     Outcome Measure Help from another person eating meals?:  None Help from another person taking care of personal grooming?: A Little Help from another person toileting, which includes using toliet, bedpan, or urinal?: A Little Help from another person bathing (including washing, rinsing, drying)?: A Lot Help from another person to put on and taking off regular upper body clothing?: A Little Help from another person to put on and taking off regular lower body clothing?: A Lot 6 Click Score: 17   End of Session Equipment Utilized During Treatment: Gait belt Nurse Communication:  (pt with dizziness during session when he sat up that took ~5 minutes to dissipate. He did not get dizzy when he stood up.)  Activity Tolerance: Patient tolerated treatment well Patient left: in bed;with call bell/phone within reach;with bed alarm set  OT Visit Diagnosis: Unsteadiness on feet (R26.81);Other abnormalities of gait and mobility (R26.89)                Time: 1025-8527 OT Time Calculation (min): 19 min Charges:  OT General Charges $OT Visit: 1 Visit OT Evaluation $OT Eval Moderate Complexity: [place], OTR/L Acute [employer] Pager [phone] Office [phone]     Almon Register 05/24/2020, 1:06 PM

## 2020-05-25 DIAGNOSIS — I442 Atrioventricular block, complete: Principal | ICD-10-CM

## 2020-05-25 DIAGNOSIS — R569 Unspecified convulsions: Secondary | ICD-10-CM | POA: Diagnosis not present

## 2020-05-25 DIAGNOSIS — I459 Conduction disorder, unspecified: Secondary | ICD-10-CM | POA: Diagnosis not present

## 2020-05-25 LAB — BASIC METABOLIC PANEL
Anion gap: 7 (ref 5–15)
BUN: 19 mg/dL (ref 8–23)
CO2: 25 mmol/L (ref 22–32)
Calcium: 8.5 mg/dL — ABNORMAL LOW (ref 8.9–10.3)
Chloride: 107 mmol/L (ref 98–111)
Creatinine, Ser: 1.25 mg/dL — ABNORMAL HIGH (ref 0.61–1.24)
GFR, Estimated: 59 mL/min — ABNORMAL LOW (ref >60)
Glucose, Bld: 115 mg/dL — ABNORMAL HIGH (ref 70–99)
Potassium: 3.8 mmol/L (ref 3.5–5.1)
Sodium: 139 mmol/L (ref 135–145)

## 2020-05-25 LAB — CBC
HCT: 41.3 % (ref 39.0–52.0)
Hemoglobin: 14.3 g/dL (ref 13.0–17.0)
MCH: 32 pg (ref 26.0–34.0)
MCHC: 34.6 g/dL (ref 30.0–36.0)
MCV: 92.4 fL (ref 80.0–100.0)
Platelets: 125 10*3/uL — ABNORMAL LOW (ref 150–400)
RBC: 4.47 MIL/uL (ref 4.22–5.81)
RDW: 14.5 % (ref 11.5–15.5)
WBC: 7.4 10*3/uL (ref 4.0–10.5)
nRBC: 0 % (ref 0.0–0.2)

## 2020-05-25 MED ORDER — AMLODIPINE BESYLATE 5 MG PO TABS
5.0000 mg | ORAL_TABLET | Freq: Every day | ORAL | Status: DC
  Administered 2020-05-25 – 2020-05-31 (×7): 5 mg via ORAL
  Filled 2020-05-25 (×7): qty 1

## 2020-05-25 NOTE — Progress Notes (Signed)
Visited Dustin Hamilton for his daily physical exam, as I got caught up this morning and needed to do his exam later. When I entered the room, Dustin Hamilton was using the restroom with the nurse and his wife at bedside. Dustin Hamilton requested I return in a short while.   The nurse confirmed there were no concerns or complaints. Reported patient was ambulating well, had no respiratory distress, and had not experienced any episodes so far today. Last vitals are below.   Blood pressure (!) 158/96, pulse 72, temperature 98.4 F (36.9 C), temperature source Oral, resp. rate 18, height 5\' 11"  (1.803 m), weight 77.1 kg, SpO2 100 %.  Will return as I am able.   Ezequiel Essex, MD

## 2020-05-25 NOTE — Progress Notes (Signed)
Subjective: No acute events overnight. No further episodes. Was working with physical therapist this morning and not have any further events. Also reports seeing floaters in both eyes at times which is not always related to the dizziness/staring episodes.  ROS: negative except above  Examination  Vital signs in last 24 hours: Temp:  [98.3 F (36.8 C)-99.8 F (37.7 C)] 98.7 F (37.1 C) (12/24 0754) Pulse Rate:  [72-100] 72 (12/24 0754) Resp:  [16-18] 18 (12/24 0754) BP: (105-154)/(72-107) 154/82 (12/24 0754) SpO2:  [96 %-98 %] 97 % (12/24 0754)  General: lying in bed, not in apparent distress CVS: pulse-normal rate and rhythm RS: breathing comfortably Extremities: normal  Neuro:  AOx3, cranial nerves II to XII grossly intact, wash moving around with physical therapist in the room  Basic Metabolic Panel: Recent Labs  Lab 05/22/20 1956 05/23/20 0353 05/24/20 0317 05/25/20 0243  NA 141 139 138 139  K 3.6 3.1* 3.6 3.8  CL 108 106 105 107  CO2 21* 23 23 25   GLUCOSE 97 171* 150* 115*  BUN 21 16 22 19   CREATININE 1.08 0.90 1.43* 1.25*  CALCIUM 9.5 9.2 9.1 8.5*  MG 1.9  --   --   --     CBC: Recent Labs  Lab 05/22/20 1956 05/23/20 0353 05/24/20 0317 05/25/20 0243  WBC 9.2 8.1 8.5 7.4  NEUTROABS 6.9  --   --   --   HGB 17.6* 16.6 15.0 14.3  HCT 52.9* 48.2 44.9 41.3  MCV 94.1 92.9 93.0 92.4  PLT 153 147* 141* 125*     Coagulation Studies: Recent Labs    05/22/20 1956  LABPROT 14.6  INR 1.2    Imaging No new brain imaging overnight  ASSESSMENT AND PLAN: 79 year old male with history of prior right-sided stroke in 2014, SVT status post ablation, mild dementia presents with episodes of expressive aphasia without loss of consciousness.   Transient alteration of awareness -With about 48 hours of normal EEG, one typical event recorder without any concomitant EEG changes, it is unlikely that these episodes are epileptic. -Differentials include BPPV, cardiogenic  syncope  Recommendations -Continue video EEG monitoring till Sunday for characterization of spells. If at that point EEG continues to be completely normal, it is highly unlikely that patient has epilepsy. -Not on any AEDs at this point. Will consider starting valproic acid if we see any ictal-interictal abnormality -Continue seizure precautions  Ativan 2 mg as needed for generalized tonic-clonic seizure lasting more than 2 minutes for focal seizure lasting more than 5 minutes -Management of rest of comorbidities per primary team  I have spent a total of25 minuteswith the patient reviewing hospitalnotes, test results, labs and examining the patient as well as establishing an assessment and plan that was discussed personally with the patient.>50% of time was spent in direct patient care.  Zeb Comfort Epilepsy [facility] For questions after 5pm please refer to AMION to reach the Neurologist on call

## 2020-05-25 NOTE — Procedures (Addendum)
Patient Name:Dustin Hamilton PTW:[id number] Epilepsy Attending:Treyvonne Tata Barbra Sarks Referring Physician/Provider:Dr Donnetta Simpers Duration:05/24/2020 2146 to 12/24/20212146  Patient history:79 y.o.malewith PMH significant for prior stroke in 2014, hx of SVT s/p ablation 6/20, hx of AV block, mild dementia whopresents today for evaluation of multiple spells that are clinically difficult to determine if they are partial complex seizures.The semiology of the event is as follows "leans forward -> expressive aphasia with intact awareness -> feels warm -> no post ictal confusion". He has about 2 events a day.EEG to evaluate for seizure  Level of alertness:Awake,asleep  AEDs during EEG study:None  Technical aspects: This EEG study was done with scalp electrodes positioned according to the 10-20 International system of electrode placement. Electrical activity was acquired at a sampling rate of 500Hz  and reviewed with a high frequency filter of 70Hz  and a low frequency filter of 1Hz . EEG data were recorded continuously and digitally stored.   Description: The posterior dominant rhythm consists of 9-10 Hz activity of moderate voltage (25-35 uV) seen predominantly in posterior head regions, symmetric and reactive to eye opening and eye closing.Sleep was characterized by vertex waves, sleep spindles (12-14hz ), maximal frontoHyperventilation and photic stimulation were not performed.   IMPRESSION: This study is within normal limits. No seizures or epileptiform discharges were seen throughout the recording.  Chole Driver Barbra Sarks

## 2020-05-25 NOTE — Progress Notes (Signed)
Progress Note  Patient Name: Dustin Hamilton Date of Encounter: 05/25/2020  [facility] Cardiologist: Quay Burow, MD   Subjective   Doing well this morning.  In good spirits.  No further episodes of syncope or presyncope.  Inpatient Medications    Scheduled Meds: . brimonidine  1 drop Both Eyes BID  . dutasteride  0.5 mg Oral Daily  . enoxaparin (LOVENOX) injection  40 mg Subcutaneous Q24H  . latanoprost  1 drop Both Eyes QHS  . memantine  17.5 mg Oral QHS   Continuous Infusions: . sodium chloride 117 mL/hr at 05/25/20 0715   PRN Meds: acetaminophen **OR** acetaminophen, polyethylene glycol   Vital Signs    Vitals:   05/24/20 1531 05/24/20 2033 05/25/20 0014 05/25/20 0409  BP: 130/84 (!) 140/107 130/82 138/86  Pulse: 87 100 78 81  Resp: 18 16 18 16   Temp: 99.8 F (37.7 C) 98.5 F (36.9 C) 98.3 F (36.8 C) 98.4 F (36.9 C)  TempSrc: Oral Oral Oral Oral  SpO2: 97% 97% 96% 97%  Weight:      Height:        Intake/Output Summary (Last 24 hours) at 05/25/2020 0722 Last data filed at 05/25/2020 0715 Gross per 24 hour  Intake 3458.27 ml  Output 720 ml  Net 2738.27 ml   Last 3 Weights 05/22/2020 05/14/2020 05/05/2020  Weight (lbs) 169 lb 15.6 oz 170 lb 163 lb  Weight (kg) 77.1 kg 77.111 kg 73.936 kg      Telemetry    Sinus rhythm.  No AV block observed.- Personally Reviewed  ECG    No new- Personally Reviewed  Physical Exam   GEN: No acute distress.  EEG monitor in place. Neck: No JVD Cardiac: RRR, no murmurs, rubs, or gallops.  Respiratory: Clear to auscultation bilaterally. GI: Soft, nontender, non-distended  MS: No edema; No deformity. Neuro:  Nonfocal  Psych: Normal affect   Labs    High Sensitivity Troponin:   Recent Labs  Lab 05/03/20 1400 05/03/20 1601 05/04/20 0626 05/22/20 1956 05/22/20 2322  TROPONINIHS 9 10 16  26* 27*      Chemistry Recent Labs  Lab 05/22/20 1956 05/23/20 0353 05/24/20 0317 05/25/20 0243  NA  141 139 138 139  K 3.6 3.1* 3.6 3.8  CL 108 106 105 107  CO2 21* 23 23 25   GLUCOSE 97 171* 150* 115*  BUN 21 16 22 19   CREATININE 1.08 0.90 1.43* 1.25*  CALCIUM 9.5 9.2 9.1 8.5*  PROT 7.2  --   --   --   ALBUMIN 3.6  --   --   --   AST 16  --   --   --   ALT 28  --   --   --   ALKPHOS 53  --   --   --   BILITOT 0.7  --   --   --   GFRNONAA >60 >60 50* 59*  ANIONGAP 12 10 10 7      Hematology Recent Labs  Lab 05/23/20 0353 05/24/20 0317 05/25/20 0243  WBC 8.1 8.5 7.4  RBC 5.19 4.83 4.47  HGB 16.6 15.0 14.3  HCT 48.2 44.9 41.3  MCV 92.9 93.0 92.4  MCH 32.0 31.1 32.0  MCHC 34.4 33.4 34.6  RDW 14.5 14.3 14.5  PLT 147* 141* 125*    BNPNo results for input(s): BNP, PROBNP in the last 168 hours.   DDimer No results for input(s): DDIMER in the last 168 hours.   Radiology  Overnight EEG with video  Result Date: 05/23/2020 Lora Havens, MD     05/24/2020  9:41 AM Patient Name: Dustin Hamilton MRN: [id number] Epilepsy Attending: Lora Havens Referring Physician/Provider: Dr Donnetta Simpers Duration: 05/22/2020 2146 to 05/23/2020 2146  Patient history: 79 y.o.malewith PMH significant for prior stroke in 2014, hx of SVT s/p ablation 6/20, hx of AV block, mild dementia whopresents today for evaluation of multiple spells that are clinically difficult to determine if they are partial complex seizures.The semiology of the event is as follows "leans forward -> expressive aphasia with intact awareness -> feels warm -> no post ictal confusion". He has about 2 events a day. EEG to evaluate for seizure  Level of alertness: Awake, asleep  AEDs during EEG study: None  Technical aspects: This EEG study was done with scalp electrodes positioned according to the 10-20 International system of electrode placement. Electrical activity was acquired at a sampling rate of 500Hz  and reviewed with a high frequency filter of 70Hz  and a low frequency filter of 1Hz . EEG data were recorded  continuously and digitally stored.  Description: The posterior dominant rhythm consists of 9-10 Hz activity of moderate voltage (25-35 uV) seen predominantly in posterior head regions, symmetric and reactive to eye opening and eye closing.  Sleep was characterized by vertex waves, sleep spindles (12-14hz ), maximal  frontoHyperventilation and photic stimulation were not performed.    IMPRESSION: This study is within normal limits. No seizures or epileptiform discharges were seen throughout the recording.  Lora Havens   Portable EEG  Result Date: 05/23/2020 Lora Havens, MD     05/23/2020  9:27 AM Patient Name: Dustin Hamilton MRN: [id number] Epilepsy Attending: Lora Havens Referring Physician/Provider: Dr Silvio Pate Date: 05/22/2020 Duration: 21.50 mins Patient history: 79 y.o. 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 The semiology of the event is as follows "leans forward -> expressive aphasia with intact awareness -> feels warm -> no post ictal confusion". He has about 2 events a day. EEG to evaluate for seizure Level of alertness: Awake AEDs during EEG study: None Technical aspects: This EEG study was done with scalp electrodes positioned according to the 10-20 International system of electrode placement. Electrical activity was acquired at a sampling rate of 500Hz  and reviewed with a high frequency filter of 70Hz  and a low frequency filter of 1Hz . EEG data were recorded continuously and digitally stored. Description: The posterior dominant rhythm consists of 9-10 Hz activity of moderate voltage (25-35 uV) seen predominantly in posterior head regions, symmetric and reactive to eye opening and eye closing.  Hyperventilation and photic stimulation were not performed.   IMPRESSION: This  study is within normal limits. No seizures or epileptiform discharges were seen throughout the recording. Lora Havens   ECHOCARDIOGRAM COMPLETE  Result Date: 05/23/2020    ECHOCARDIOGRAM REPORT   Patient Name:   Dustin Hamilton Date of Exam: 05/23/2020 Medical Rec #:  [id number]          Height:       71.0 in Accession #:    [id number]         Weight:       170.0 lb Date of Birth:  [birth year]          BSA:  1.968 m Patient Age:    57 years           BP:           141/94 mmHg Patient Gender: M                  HR:           77 bpm. Exam Location:  Inpatient Procedure: 2D Echo, Cardiac Doppler, Color Doppler and Intracardiac            Opacification Agent Indications:    R55 Syncope  History:        Patient has prior history of Echocardiogram examinations, most                 recent 05/04/2020. Abnormal ECG, COPD and Stroke,                 Signs/Symptoms:Syncope, Chest Pain and                 Dizziness/Lightheadedness; Risk Factors:Hypertension.  Sonographer:    Roseanna Rainbow RDCS Referring Phys: [id number] Elouise Munroe  Sonographer Comments: Technically difficult study due to poor echo windows, suboptimal parasternal window, suboptimal apical window and suboptimal subcostal window. Image acquisition challenging due to respiratory motion. Extremely difficult study. Attemptd to document windows and locations and tried to turn patient. Definity attempted for the second time. IMPRESSIONS  1. Left ventricular ejection fraction, by estimation, is 60 to 65%. The left ventricle has normal function. The left ventricle has no regional wall motion abnormalities. There is mild asymmetric left ventricular hypertrophy of the basal-septal segment. Left ventricular diastolic parameters are consistent with Grade I diastolic dysfunction (impaired relaxation).  2. Right ventricular systolic function is normal. The right ventricular size is mildly enlarged.  3. A small pericardial effusion is present. The pericardial  effusion is circumferential. There is no evidence of cardiac tamponade.  4. The mitral valve is grossly normal. No evidence of mitral valve regurgitation. No evidence of mitral stenosis.  5. The aortic valve is tricuspid. There is mild calcification of the aortic valve. Aortic valve regurgitation is not visualized. Mild aortic valve sclerosis is present, with no evidence of aortic valve stenosis.  6. The inferior vena cava is normal in size with greater than 50% respiratory variability, suggesting right atrial pressure of 3 mmHg. Comparison(s): No significant change from prior study. FINDINGS  Left Ventricle: Left ventricular ejection fraction, by estimation, is 60 to 65%. The left ventricle has normal function. The left ventricle has no regional wall motion abnormalities. Definity contrast agent was given IV to delineate the left ventricular  endocardial borders. The left ventricular internal cavity size was normal in size. There is mild asymmetric left ventricular hypertrophy of the basal-septal segment. Left ventricular diastolic parameters are consistent with Grade I diastolic dysfunction  (impaired relaxation). Right Ventricle: The right ventricular size is mildly enlarged. No increase in right ventricular wall thickness. Right ventricular systolic function is normal. Left Atrium: Left atrial size was normal in size. Right Atrium: Right atrial size was normal in size. Pericardium: A small pericardial effusion is present. The pericardial effusion is circumferential. There is no evidence of cardiac tamponade. Presence of pericardial fat pad. Mitral Valve: The mitral valve is grossly normal. No evidence of mitral valve regurgitation. No evidence of mitral valve stenosis. Tricuspid Valve: The tricuspid valve is grossly normal. Tricuspid valve regurgitation is not demonstrated. No evidence of tricuspid stenosis. Aortic Valve: The aortic valve is tricuspid. There is mild  calcification of the aortic valve. Aortic  valve regurgitation is not visualized. Mild aortic valve sclerosis is present, with no evidence of aortic valve stenosis. Pulmonic Valve: The pulmonic valve was grossly normal. Pulmonic valve regurgitation is not visualized. No evidence of pulmonic stenosis. Aorta: The aortic root and ascending aorta are structurally normal, with no evidence of dilitation. Venous: The inferior vena cava is normal in size with greater than 50% respiratory variability, suggesting right atrial pressure of 3 mmHg. IAS/Shunts: The atrial septum is grossly normal.  LEFT VENTRICLE PLAX 2D LVIDd:         3.80 cm     Diastology LVIDs:         2.70 cm     LV e' medial:    7.18 cm/s LV PW:         0.94 cm     LV E/e' medial:  6.4 LV IVS:        1.27 cm     LV e' lateral:   10.90 cm/s                            LV E/e' lateral: 4.2  LV Volumes (MOD) LV vol d, MOD A2C: 43.9 ml LV vol d, MOD A4C: 43.4 ml LV vol s, MOD A2C: 20.2 ml LV vol s, MOD A4C: 19.6 ml LV SV MOD A2C:     23.7 ml LV SV MOD A4C:     43.4 ml LV SV MOD BP:      24.2 ml RIGHT VENTRICLE         IVC TAPSE (M-mode): 1.4 cm  IVC diam: 1.20 cm LEFT ATRIUM             Index      RIGHT ATRIUM           Index LA diam:        2.90 cm 1.47 cm/m RA Area:     10.77 cm LA Vol (A2C):   10.8 ml 5.49 ml/m RA Volume:   21.65 ml  11.00 ml/m LA Vol (A4C):   16.8 ml 8.54 ml/m LA Biplane Vol: 14.6 ml 7.42 ml/m  AORTIC VALVE LVOT Vmax:   82.10 cm/s LVOT Vmean:  50.300 cm/s LVOT VTI:    0.155 m  AORTA Ao Root diam: 3.30 cm Ao Asc diam:  3.30 cm MITRAL VALVE MV Area (PHT): 5.13 cm     SHUNTS MV Decel Time: 148 msec     Systemic VTI: 0.16 m MV E velocity: 45.90 cm/s MV A velocity: 106.00 cm/s MV E/A ratio:  0.43 Eleonore Chiquito MD Electronically signed by Eleonore Chiquito MD Signature Date/Time: 05/23/2020/12:03:59 PM    Final     Cardiac Studies   No new    Assessment & Plan    Mr. Sisney is a 79 year old man with recurrent syncope.  He is recently worn a heart monitor which showed an  8-second period of complete heart block.  His situation is complex given there are elements of clear AV conduction disease but also elements of relatively recent diagnosis of seizure plus possible vasovagal syncope/orthostatic intolerance.  Neurologic work-up is ongoing.  Prior to discharge, we are planning to implant a permanent pacemaker given observed complete heart block.  I suspect he will be a good candidate for a closed-loop stimulation device from [employer].  1.  Complete heart block Plan for permanent pacemaker early next week.  Please keep him n.p.o. Sunday night.  For questions or updates, please contact [facility] Please consult [email or web address] for contact info under        Signed, Vickie Epley, MD  05/25/2020, 7:22 AM

## 2020-05-25 NOTE — Progress Notes (Signed)
Family Medicine Teaching Service Daily Progress Note Intern Pager: [phone]  Patient name: Dustin Hamilton Medical record number: [id number] Date of birth: 05-Jun-1940 Age: 79 y.o. Gender: male  Primary Care Provider: [facility] Consultants: Neurology, cardiology Code Status: Full  Pt Overview and Major Events to Date:  12/21 Admitted   Assessment and Plan: 12/21 Admitted 12/22 EEG completed 12/23-25 Continuous EEG  Assessment and Plan: Dustin Hamilton a 79 y.o.malepresenting by EMS s/pseizure-likeepsiode. Of note, patient recently hospitalized 05/03/2020 with same presentation; workup at that time was inconclusive. PMH is significant forCVAin 2014, HTN, pSVT with AVNRT ablation in 11/2018, Mobitz Type 1 AV block, dizziness.  Seizure: acute, resolved Per neuro, patient's EEG still continues to be completely normal. Plan is for continuing EEG for another day or 2. By Sunday if EEG is completely normal we will discontinue as it is highly unlikely that any of these episodes are epileptic at that point.  - Neurologyfollowing, appreciate continued care of this patient -Cardiologyfollowing, plan for permanent pacemaker placement early next week.  Plan for n.p.o. Sunday night. - Continuous cardiac monitoring, pulse ox - Orthostatic vital signs - AM CBC, BMP - PT/OT eval and treat - Seizure precautions/Fall precautions  - cEEG wnl, continuing for another 24 hours  - Ativan 2mg  prn for generalized tonic-clonic seizure lasting > 2 min, or focal seizure lasting >69min  - Neuro considering starting valproic acid if ictal-interictal abnormalities seen on EEG - consider aricept and/or namenda as etiology of these syncopal episodes and intermittent heart block - Cardiology suggested CTA head/neck as potentially helpful for determining etiology of episodes. Given creatinine elevated at this time, will defer this exam until later. Possibly after pacemaker placement  Monday.    Right Ankle Pain: acute, stable Patient notes right ankle pain that began about4days ago. Denies any injury, trauma to the area. Full range of motion, 5 out of 5 strength. Ankle x-ray in ED negative for fracture. Pain on palpation to area lateral to Achilles tendon and posterior to lateral malleolus.Most likely differential includes anterior talofibular sprain.  - PT eval and treat  Complete heart block Recent ZIO monitor showed multiple episodes of complete heart block with 8-9 seconds without ventricular contraction (3 episodes over 2 weeks)Potentiallyrelated to syncope  - continuous cardiac monitoring -Cardiologyfollowing, plan for permanent pacemaker placement early next week.  Plan for n.p.o. Sunday night.  Atrial Flutter  On Eliquis 5 mg BID. Echo 12/03, with EF 79-89%, grade 1 diastolic dysfunction but no regional wall abnormalities.  - Continue home Eliquis  HTN: chronic, stable BP last 24 hours 105-154/72-107, pulse is 72-100.  Last 154/82 with pulse 72. On Amlodipine 5 mg. BP elevated while in ED with the highest pressure being 200/116 which improved to 211'H systolic. Per chart review it appears patient was recommended to increase to 10 mg but has not. Would likely benefit from additional anti-HTN medications. - Resuming home amlodipine 5 mg today, held yesterday due to soft BPs - Vitals per floor protocol  Hx Polycythemia Hx of Thrombocytopenia Currently resolved. Polycythemic at 17.6>16.6 >15>14.3.Most recent baseline appears to be 15-16. Per chart review, patient with history of polycythemia as far back as 2008 and as high as 18.7 in 2017. Hx of thrombocytopenia on prior admission.Platelets on admission 153   - Monitor with daily CBC  - Consider serum EPO vs outpatient work up  BPH: chronic, stable On Dutasteride 0.5 mg daily and Tamsulosin 0.4 mg daily. Patient with positive orthostatics on previous admission. - Continue  Dutasteride 0.5  mgevery morning - Holding Tamsulosin 0.4 mg  History of CVA in 2014 Followed by Neurologist, Dr. [name].Previously on Plavix and ASA but per wife, was recently transitioned to just Eliquis. - PT/OT eval and treat -Continue Eliquis 5 mg BID  Glaucoma: chronic, stable Vision difficulties in left eye. - Continue Brimonidine 0.2% ophthalmic solution 1 drop both eyes BID  - Continue Latanoprost 0.005% ophthalmic solution, 1 drop both eyes QHS  Mild dementia: chronic, stable Follows with Neurology, as above. On Namenda 17.5mg  QHS - Continue Namendanightly  FEN/GI:regular diet [id number]  Disposition:Med-tele. Dispo pending Cards and Neuro   Status is: Inpatient  Remains inpatient appropriate because:Ongoing diagnostic testing needed not appropriate for outpatient work up and Inpatient level of care appropriate due to severity of illness   Dispo: The patient is from: Home              Anticipated d/c is to: Home              Anticipated d/c date is: > 3 days              Patient currently is not medically stable to d/c.    Subjective:  Patient not yet examined this morning. I will do so and drop an interim progress note as soon as I am able.   Objective: Temp:  [97.6 F (36.4 C)-99.8 F (37.7 C)] 97.6 F (36.4 C) (12/24 1131) Pulse Rate:  [72-100] 77 (12/24 1131) Resp:  [16-20] 20 (12/24 1131) BP: (114-154)/(81-107) 114/81 (12/24 1131) SpO2:  [94 %-97 %] 94 % (12/24 1131)   Laboratory: Recent Labs  Lab 05/23/20 0353 05/24/20 0317 05/25/20 0243  WBC 8.1 8.5 7.4  HGB 16.6 15.0 14.3  HCT 48.2 44.9 41.3  PLT 147* 141* 125*   Recent Labs  Lab 05/22/20 1956 05/23/20 0353 05/24/20 0317 05/25/20 0243  NA 141 139 138 139  K 3.6 3.1* 3.6 3.8  CL 108 106 105 107  CO2 21* 23 23 25   BUN 21 16 22 19   CREATININE 1.08 0.90 1.43* 1.25*  CALCIUM 9.5 9.2 9.1 8.5*  PROT 7.2  --   --   --   BILITOT 0.7  --   --   --   ALKPHOS 53  --   --   --    ALT 28  --   --   --   AST 16  --   --   --   GLUCOSE 97 171* 150* 115*    Imaging/Diagnostic Tests: ECHO 05/23/2020 IMPRESSIONS  1. Left ventricular ejection fraction, by estimation, is 60 to 65%. The  left ventricle has normal function. The left ventricle has no regional  wall motion abnormalities. There is mild asymmetric left ventricular  hypertrophy of the basal-septal segment.  Left ventricular diastolic parameters are consistent with Grade I  diastolic dysfunction (impaired relaxation).  2. Right ventricular systolic function is normal. The right ventricular  size is mildly enlarged.  3. A small pericardial effusion is present. The pericardial effusion is  circumferential. There is no evidence of cardiac tamponade.  4. The mitral valve is grossly normal. No evidence of mitral valve  regurgitation. No evidence of mitral stenosis.  5. The aortic valve is tricuspid. There is mild calcification of the  aortic valve. Aortic valve regurgitation is not visualized. Mild aortic  valve sclerosis is present, with no evidence of aortic valve stenosis.  6. The inferior vena cava is normal in size with greater than 50%  respiratory variability, suggesting right atrial pressure of 3 mmHg.   Overnight EEG with video 05/23/2020 IMPRESSION: This study is within normal limits. No seizures or epileptiform discharges were seen throughout the recording.   Ezequiel Essex, MD 05/25/2020, 1:16 PM PGY-1, [facility] Intern pager: [phone], text pages welcome

## 2020-05-25 NOTE — Progress Notes (Signed)
FPTS Interim Progress Note   S: Visited patient to perform yearly physical exam.  Outpatient sitting up in bed eating dinner from his dinner tray with wife at the bedside.  He appeared in good spirits and had no complaints.  Michela Pitcher he was doing quite well today.  Discussed testing results up to this point and also plan going forward.  Answered all questions, patient and wife seem satisfied.  O: BP (!) 158/96 (BP Location: Right Arm)   Pulse 72   Temp 98.4 F (36.9 C) (Oral)   Resp 18   Ht 5\' 11"  (1.803 m)   Wt 77.1 kg   SpO2 100%   BMI 23.71 kg/m   General: Awake, alert, oriented, no acute distress, with intermittently drift off from conversation and watch TV but redirectable by wife Respiratory: No respiratory distress, lungs clear to auscultation bilaterally in anterior fields Cardiac: Regular rate and rhythm, no murmurs appreciated Extremities: No BLE edema, moving all extremities spontaneously  A/P: -Per neuro, continuous EEG through Sunday -Per cards, plan for n.p.o. at midnight Sunday @2359  with permanent pacemaker placement Monday -Activity and diet as tolerated -Continue PT/OT -Consider CTA head neck early next week if creatinine normalizes  Ezequiel Essex, MD 05/25/2020, 6:00 PM PGY-1, [place] [facility] Service pager [phone]

## 2020-05-25 NOTE — Progress Notes (Signed)
Physical Therapy Treatment Patient Details Name: Dustin Hamilton MRN: [id number] DOB: 06/02/1941 Today's Date: 05/25/2020    History of Present Illness 79 y.o. male presenting by EMS s/p seizure-like epsiode. PMH is significant for CVA, HTN, pSVT with AVNRT ablation in 11/2018, Mobitz Type 1 AV block, dizziness.    PT Comments    Pt tolerates treatment well without reports of dizziness this session. Pt is able to progress to transfer and gait training and demonstrates fair stability with UE support of RW but is unsteady with hand hold or without UE support. Pt will benefit from continued acute PT POC to improve balance and gait quality. PT recommends discharge home with HHPT, use of a RW for all OOB activity, and 24/7 assistance from spouse or family.   Follow Up Recommendations  Home health PT;Supervision/Assistance - 24 hour     Equipment Recommendations  None recommended by PT (pt owns RW)    Recommendations for Other Services       Precautions / Restrictions Precautions Precautions: Fall Precaution Comments: syncope vs seizure-like episodes Restrictions Weight Bearing Restrictions: No    Mobility  Bed Mobility Overal bed mobility: Needs Assistance Bed Mobility: Supine to Sit     Supine to sit: Supervision        Transfers Overall transfer level: Needs assistance Equipment used: 1 person hand held assist;Rolling walker (2 wheeled) Transfers: Sit to/from Stand Sit to Stand: Min assist;Min guard Stand pivot transfers: Min guard       General transfer comment: minA with bilateral hand hold, minG with RW  Ambulation/Gait Ambulation/Gait assistance: Min guard;Min assist Gait Distance (Feet): 40 Feet (2' with hand hold and minA, 55' x2 with RW and minG) Assistive device: Rolling walker (2 wheeled);1 person hand held assist Gait Pattern/deviations: Step-to pattern Gait velocity: reduced Gait velocity interpretation: <1.8 ft/sec, indicate of risk for recurrent  falls General Gait Details: pt with slowed step-to gait, somewhat limited by EEG lines. Pt is unsteady with hand hold but this improves with use of RW   Stairs             Wheelchair Mobility    Modified Rankin (Stroke Patients Only) Modified Rankin (Stroke Patients Only) Pre-Morbid Rankin Score: Slight disability Modified Rankin: Moderately severe disability     Balance Overall balance assessment: Needs assistance Sitting-balance support: No upper extremity supported;Feet supported Sitting balance-Leahy Scale: Good     Standing balance support: Bilateral upper extremity supported Standing balance-Leahy Scale: Poor Standing balance comment: reliant on UE support of hand hold or RW                            Cognition Arousal/Alertness: Awake/alert Behavior During Therapy: WFL for tasks assessed/performed Overall Cognitive Status: Within Functional Limits for tasks assessed                                        Exercises      General Comments General comments (skin integrity, edema, etc.): VSS on RA. Orthostatic BP: 129/75 supine, 145/92 sitting, 175/94 standing. Some marching in place occurred between sitting and standing BP. Pt denies dizziness througohut session      Pertinent Vitals/Pain Pain Assessment: Faces Faces Pain Scale: Hurts even more Pain Location: R ankle Pain Descriptors / Indicators: Grimacing Pain Intervention(s): Monitored during session    Home Living  Prior Function            PT Goals (current goals can now be found in the care plan section) Acute Rehab PT Goals Patient Stated Goal: to not have episodes Progress towards PT goals: Progressing toward goals    Frequency    Min 3X/week      PT Plan Current plan remains appropriate    Co-evaluation              AM-PAC PT "6 Clicks" Mobility   Outcome Measure  Help needed turning from your back to your side  while in a flat bed without using bedrails?: A Little Help needed moving from lying on your back to sitting on the side of a flat bed without using bedrails?: A Little Help needed moving to and from a bed to a chair (including a wheelchair)?: A Little Help needed standing up from a chair using your arms (e.g., wheelchair or bedside chair)?: A Little Help needed to walk in hospital room?: A Little Help needed climbing 3-5 steps with a railing? : A Lot 6 Click Score: 17    End of Session Equipment Utilized During Treatment: Gait belt Activity Tolerance: Patient tolerated treatment well Patient left: in bed;with call bell/phone within reach;with bed alarm set Nurse Communication: Mobility status PT Visit Diagnosis: Other abnormalities of gait and mobility (R26.89);Dizziness and giddiness (R42);Other symptoms and signs involving the nervous system (R29.898)     Time: [id number] PT Time Calculation (min) (ACUTE ONLY): 28 min  Charges:  $Gait Training: 8-22 mins $Therapeutic Activity: 8-22 mins                     Zenaida Niece, PT, DPT Acute Rehabilitation Pager: [phone]    Zenaida Niece 05/25/2020, 11:27 AM

## 2020-05-26 DIAGNOSIS — R569 Unspecified convulsions: Secondary | ICD-10-CM | POA: Diagnosis not present

## 2020-05-26 LAB — BASIC METABOLIC PANEL
Anion gap: 8 (ref 5–15)
BUN: 12 mg/dL (ref 8–23)
CO2: 21 mmol/L — ABNORMAL LOW (ref 22–32)
Calcium: 8.8 mg/dL — ABNORMAL LOW (ref 8.9–10.3)
Chloride: 108 mmol/L (ref 98–111)
Creatinine, Ser: 0.87 mg/dL (ref 0.61–1.24)
GFR, Estimated: >60 mL/min (ref >60)
Glucose, Bld: 96 mg/dL (ref 70–99)
Potassium: 3.6 mmol/L (ref 3.5–5.1)
Sodium: 137 mmol/L (ref 135–145)

## 2020-05-26 LAB — SURGICAL PCR SCREEN
MRSA, PCR: NEGATIVE
Staphylococcus aureus: NEGATIVE

## 2020-05-26 LAB — CBC
HCT: 43.5 % (ref 39.0–52.0)
Hemoglobin: 15.2 g/dL (ref 13.0–17.0)
MCH: 32.1 pg (ref 26.0–34.0)
MCHC: 34.9 g/dL (ref 30.0–36.0)
MCV: 91.8 fL (ref 80.0–100.0)
Platelets: 129 10*3/uL — ABNORMAL LOW (ref 150–400)
RBC: 4.74 MIL/uL (ref 4.22–5.81)
RDW: 14 % (ref 11.5–15.5)
WBC: 5.8 10*3/uL (ref 4.0–10.5)
nRBC: 0 % (ref 0.0–0.2)

## 2020-05-26 MED ORDER — VANCOMYCIN HCL IN DEXTROSE 1-5 GM/200ML-% IV SOLN
1000.0000 mg | INTRAVENOUS | Status: AC
  Administered 2020-05-28: 1000 mg via INTRAVENOUS

## 2020-05-26 MED ORDER — SODIUM CHLORIDE 0.9 % IV SOLN
80.0000 mg | INTRAVENOUS | Status: AC
  Administered 2020-05-28: 80 mg

## 2020-05-26 MED ORDER — SODIUM CHLORIDE 0.9 % IV SOLN: INTRAVENOUS | Status: DC

## 2020-05-26 MED ORDER — DICLOFENAC SODIUM 1 % EX GEL
2.0000 g | Freq: Four times a day (QID) | CUTANEOUS | Status: DC | PRN
  Administered 2020-05-26 – 2020-05-30 (×4): 2 g via TOPICAL
  Filled 2020-05-26: qty 100

## 2020-05-26 NOTE — Treatment Plan (Signed)
Treatment Plan  Patient with c/o dizziness and becomes unable to respond to others. Two such push-button events today, captured on long-term video EEG recording. He returns rapidly to baseline. No need for additional testing/change in management at this time. Will discuss with Dr. Lorrin Goodell as well.  Perfecto Kingdom, MD

## 2020-05-26 NOTE — Progress Notes (Signed)
Physical Therapy Treatment Patient Details Name: Dustin Hamilton MRN: [id number] DOB: 1941-04-25 Today's Date: 05/26/2020    History of Present Illness 79 y.o. male presenting by EMS s/p seizure-like epsiode. PMH is significant for CVA, HTN, pSVT with AVNRT ablation in 11/2018, Mobitz Type 1 AV block, dizziness.    PT Comments    Pt tolerates treatment well with increased ambulation distances. Pt remains generally weak and continues to require use of a RW to reduce physical assistance needs during transfers and ambulation. Pt does become incontinent of urine during session when attempting to walk to bathroom. Pt will benefit from continued acute PT POC to improve LE strength and to restore independence. PT continues to recommend discharge home with HHPT.   Follow Up Recommendations  Home health PT;Supervision/Assistance - 24 hour     Equipment Recommendations  None recommended by PT (pt already owns a RW)    Recommendations for Other Services       Precautions / Restrictions Precautions Precautions: Fall Precaution Comments: syncope vs seizure-like episodes Restrictions Weight Bearing Restrictions: No    Mobility  Bed Mobility Overal bed mobility: Modified Independent                Transfers Overall transfer level: Needs assistance Equipment used: Rolling walker (2 wheeled) Transfers: Sit to/from Stand Sit to Stand: Supervision;Min assist (minA without device)            Ambulation/Gait Ambulation/Gait assistance: Supervision Gait Distance (Feet): 200 Feet (walking consecutive laps in room) Assistive device: Rolling walker (2 wheeled) Gait Pattern/deviations: Step-to pattern Gait velocity: reduced Gait velocity interpretation: <1.8 ft/sec, indicate of risk for recurrent falls General Gait Details: pt with slowed step-through gait, gait assessment somewhat limited due to tight quarters in room and EEG lines   Stairs             Wheelchair  Mobility    Modified Rankin (Stroke Patients Only) Modified Rankin (Stroke Patients Only) Pre-Morbid Rankin Score: Slight disability Modified Rankin: Moderately severe disability     Balance Overall balance assessment: Needs assistance Sitting-balance support: No upper extremity supported;Feet supported Sitting balance-Leahy Scale: Good     Standing balance support: No upper extremity supported;During functional activity Standing balance-Leahy Scale: Fair                              Cognition Arousal/Alertness: Awake/alert Behavior During Therapy: WFL for tasks assessed/performed Overall Cognitive Status: Within Functional Limits for tasks assessed                                        Exercises      General Comments General comments (skin integrity, edema, etc.): VSS on RA, pt denies dizziness this session      Pertinent Vitals/Pain Pain Assessment: Faces Faces Pain Scale: Hurts little more Pain Location: R ankle Pain Descriptors / Indicators: Sore Pain Intervention(s): Monitored during session    Home Living                      Prior Function            PT Goals (current goals can now be found in the care plan section) Acute Rehab PT Goals Patient Stated Goal: to not have episodes Progress towards PT goals: Progressing toward goals    Frequency    Min  3X/week      PT Plan Current plan remains appropriate    Co-evaluation              AM-PAC PT "6 Clicks" Mobility   Outcome Measure  Help needed turning from your back to your side while in a flat bed without using bedrails?: None Help needed moving from lying on your back to sitting on the side of a flat bed without using bedrails?: None Help needed moving to and from a bed to a chair (including a wheelchair)?: A Little Help needed standing up from a chair using your arms (e.g., wheelchair or bedside chair)?: A Little Help needed to walk in hospital  room?: A Little Help needed climbing 3-5 steps with a railing? : A Lot 6 Click Score: 19    End of Session   Activity Tolerance: Patient tolerated treatment well Patient left: in bed;with call bell/phone within reach;with bed alarm set Nurse Communication: Mobility status PT Visit Diagnosis: Other abnormalities of gait and mobility (R26.89);Dizziness and giddiness (R42);Other symptoms and signs involving the nervous system (R29.898)     Time: [id number] PT Time Calculation (min) (ACUTE ONLY): 24 min  Charges:  $Gait Training: 8-22 mins $Therapeutic Activity: 8-22 mins                     Zenaida Niece, PT, DPT Acute Rehabilitation Pager: [phone]    Zenaida Niece 05/26/2020, 10:00 AM

## 2020-05-26 NOTE — Procedures (Addendum)
Patient Name:Dustin Hamilton ZOX:[id number] Epilepsy Attending:Gracelee Stemmler Barbra Sarks Referring Physician/Provider:Dr Donnetta Simpers Duration:05/25/2020 2146 to 05/26/2020 2146  Patient history:79 y.o.malewith PMH significant for prior stroke in 2014, hx of SVT s/p ablation 6/20, hx of AV block, mild dementia whopresents today for evaluation of multiple spells that are clinically difficult to determine if they are partial complex seizures.The semiology of the event is as follows "leans forward -> expressive aphasia with intact awareness -> feels warm -> no post ictal confusion". He has about 2 events a day.EEG to evaluate for seizure  Level of alertness:Awake,asleep  AEDs during EEG study:None  Technical aspects: This EEG study was done with scalp electrodes positioned according to the 10-20 International system of electrode placement. Electrical activity was acquired at a sampling rate of 500Hz  and reviewed with a high frequency filter of 70Hz  and a low frequency filter of 1Hz . EEG data were recorded continuously and digitally stored.   Description: The posterior dominant rhythm consists of 9-10 Hz activity of moderate voltage (25-35 uV) seen predominantly in posterior head regions, symmetric and reactive to eye opening and eye closing.Sleep was characterized by vertex waves, sleep spindles (12-14hz ), maximal frontoHyperventilation and photic stimulation were not performed.   Patient event was captured on 05/26/2020 at 1457 during which patient was speaking on phone and then said" I feel like I am having one of those spells". When tech came in, patient was sitting in recliner, head down. On asking how is he doing, per tech patient looked confused, dizzy and had slurred speech.  Concomitant EEG before, during and after the event did not show any EEG to suggest seizure.  Patient event was captured on 05/26/2020 at 1802 during which patient was sitting in bed, wife noted  patient wasn't responding. Concomitant EEG before, during and after the event did not show any EEG to suggest seizure.  Event button was pressed on 05/26/2020 at [place] for unclear reasons. Concomitant EEG before, during and after the event did not show any EEG to suggest seizure.  IMPRESSION: This study is within normal limits. No seizures or epileptiform discharges were seen throughout the recording.  Multiple events were recorded as described above without concomitant eeg change and were NOT epileptic.  [name] Barbra Sarks

## 2020-05-26 NOTE — Progress Notes (Addendum)
Family Medicine Teaching Service Daily Progress Note Intern Pager: [phone]  Patient name: Dustin Hamilton Medical record number: [id number] Date of birth: May 18, 1941 Age: 79 y.o. Gender: male  Primary Care Provider: [facility] Consultants: Cardiology, neurology Code Status: Full  Pt Overview and Major Events to Date: 12/21 Admitted 12/22 EEG completed 12/23-25 Continuous EEG  Assessment and Plan: Dustin Pare Edgertonis a 79 y.o.malepresenting by EMS s/pseizure-likeepsiode. Of note, patient recently hospitalized 05/03/2020 with same presentation; workup at that time was inconclusive. PMH is significant forCVAin 2014, HTN, pSVT with AVNRT ablation in 11/2018, Mobitz Type 1 AV block, dizziness.  Seizure: acute, resolved Patient reports no "spells" overnight.  Reports feeling quite well and has no complaints.  Per neuro, plan to keep on continuous EEG through Sunday.  At that time, if no seizure activity detected from continuous EEG, neuro will conclude no epileptic etiology.  Per cardiology, plan for permanent pacemaker placement Monday with n.p.o. at midnight Sunday night. - Neurologyand cards following, appreciate continued care of his patient - Continuous cardiac monitoring, pulse ox - AM CBC to watch platelets, BMP to watch creatinine - PT/OT eval and treat - Seizure precautions/Fall precautions - Ativan 2mg  prn for generalized tonic-clonic seizure lasting >2 min, or focal seizure lasting >13min  - Neuro considering starting valproic acid if ictal-interictal abnormalities seen on EEG - consider aricept and/or namenda as etiology of these syncopal episodes and intermittent heart block - Cardiology suggested CTA head/neck as potentially helpful for determining etiology of episodes. Given creatinine elevated at this time, will defer this exam until later. Possibly after pacemaker placement Monday.    Right Ankle Pain: acute, stable Lateral and posterior  right ankle acutely TTP.  No obvious swelling, deformity, redness, or warmth.  XR performed 12/21 demonstrates no acute abnormality.  Patient reports ankle actually feels better after movement and PT.  Unsure etiology of right ankle pain; improvement after use incongruent with ligamentous injury.  - PT eval and treat -Voltaren gel -Tylenol p.o. as needed  Complete heart block Recent ZIO monitor showed multiple episodes of complete heart block with 8-9 seconds without ventricular contraction (3 episodes over 2 weeks)Potentiallyrelated to syncope  - continuous cardiac monitoring -Cardiologyfollowing, plan for pacemaker as above  Atrial Flutter  On Eliquis 5 mg BID. Echo 12/03, with EF 58-52%, grade 1 diastolic dysfunction but no regional wall abnormalities.  - Continue home Eliquis  HTN: chronic, stable BP last 24 hours 114-158/81-102 with pulses 64-79.  Last 144/102 with pulse 78. -Resumed home amlodipine 5 mg 12/24, held previously due to soft BPs - Vitals per floor protocol  Hx Polycythemia Hx of Thrombocytopenia Currently resolved.Polycythemic at 17.6>16.6>15>14.3>15.2.Most recent baseline appears to be 15-16. Per chart review, patient with history of polycythemia as far back as 2008 and as high as 18.7 in 2017. Hx of thrombocytopenia on prior admission.Platelets on admission 153.  - Monitor with daily CBC  - Consider serum EPO vs outpatient work up  BPH: chronic, stable On Dutasteride 0.5 mg daily and Tamsulosin 0.4 mg daily. Patient with positive orthostatics on previous admission. - Continue Dutasteride 0.5 mgevery morning -HoldingTamsulosin 0.4 mg  History of CVA in 2014 Followed by Neurologist, Dr. [name].Previously on Plavix and ASA but per wife, was recently transitioned to just Eliquis. - PT/OT eval and treat -Continue Eliquis 5 mg BID  Glaucoma: chronic, stable Vision difficulties in left eye. - Continue Brimonidine 0.2% ophthalmic  solution 1 drop both eyes BID  - Continue Latanoprost 0.005% ophthalmic solution, 1 drop both  eyes QHS  Mild dementia: chronic, stable Follows with Neurology, as above. On Namenda 17.5mg  QHS - Continue Namendanightly  FEN/GI:regular diet [id number]   Status is: Inpatient  Remains inpatient appropriate because:Ongoing diagnostic testing needed not appropriate for outpatient work up and Inpatient level of care appropriate due to severity of illness   Dispo: The patient is from: Home              Anticipated d/c is to: Home              Anticipated d/c date is: > 3 days              Patient currently is not medically stable to d/c.    Subjective:  Patient doing quite well.  Was found sleeping comfortably in bed, awoke easily to calling of his name.  He reports sleeping quite well, has no complaints.  Reports no spells overnight and last 24 hours.  No dizziness, syncope, lightheadedness.  No heart palpitations, skipping beats, or pounding.  Feels as though his breathing is doing quite well.  Does complain of continued right ankle pain, says that it gets better after PT and with use.  Objective: Temp:  [97.6 F (36.4 C)-98.4 F (36.9 C)] 97.7 F (36.5 C) (12/25 0826) Pulse Rate:  [64-79] 78 (12/25 0826) Resp:  [16-20] 16 (12/25 0826) BP: (114-158)/(81-102) 144/102 (12/25 0826) SpO2:  [94 %-100 %] 97 % (12/25 0826)  Physical Exam: General: Awake, alert, no acute distress Cardiovascular: Regular rate and rhythm, no murmurs appreciated Respiratory: Clear to auscultation bilaterally Abdomen: Nondistended Extremities: No BLE edema, acute TTP over lateral aspect of right ankle, right ankle without obvious deformity, swelling, redness, warmth.  Laboratory: Recent Labs  Lab 05/24/20 0317 05/25/20 0243 05/26/20 0323  WBC 8.5 7.4 5.8  HGB 15.0 14.3 15.2  HCT 44.9 41.3 43.5  PLT 141* 125* 129*   Recent Labs  Lab 05/22/20 1956 05/23/20 0353 05/24/20 0317  05/25/20 0243 05/26/20 0323  NA 141   < > 138 139 137  K 3.6   < > 3.6 3.8 3.6  CL 108   < > 105 107 108  CO2 21*   < > 23 25 21*  BUN 21   < > 22 19 12   CREATININE 1.08   < > 1.43* 1.25* 0.87  CALCIUM 9.5   < > 9.1 8.5* 8.8*  PROT 7.2  --   --   --   --   BILITOT 0.7  --   --   --   --   ALKPHOS 53  --   --   --   --   ALT 28  --   --   --   --   AST 16  --   --   --   --   GLUCOSE 97   < > 150* 115* 96   < > = values in this interval not displayed.    Imaging/Diagnostic Tests: None last 24 hours  Ezequiel Essex, MD 05/26/2020, 8:38 AM PGY-1, [place] Intern pager: [phone], text pages welcome

## 2020-05-26 NOTE — Progress Notes (Signed)
EEG maint complete. No skin breakdown at FP1 Fp2 F7 F8 Fz Cz. Continue to monitor

## 2020-05-26 NOTE — Progress Notes (Signed)
Progress Note  Patient Name: Dustin Hamilton Date of Encounter: 05/26/2020  [facility] Cardiologist: Quay Burow, MD    79 yo with complete heart block demonstrated on monitor; for pacing on Monday pending neuro workup   Subjective   Without complaints  No spells   Inpatient Medications    Scheduled Meds: . amLODipine  5 mg Oral Daily  . brimonidine  1 drop Both Eyes BID  . dutasteride  0.5 mg Oral Daily  . enoxaparin (LOVENOX) injection  40 mg Subcutaneous Q24H  . latanoprost  1 drop Both Eyes QHS  . memantine  17.5 mg Oral QHS   Continuous Infusions: . sodium chloride 117 mL/hr at 05/25/20 1329   PRN Meds: acetaminophen **OR** acetaminophen, polyethylene glycol   Vital Signs    Vitals:   05/25/20 1547 05/25/20 2041 05/26/20 0003 05/26/20 0414  BP: (!) 158/96 (!) 155/92 131/88 (!) 139/95  Pulse: 72 79 76 64  Resp: 18 18 17 16   Temp: 98.4 F (36.9 C) 97.7 F (36.5 C) 98.2 F (36.8 C) 98.2 F (36.8 C)  TempSrc: Oral Oral Oral Oral  SpO2: 100% 97% 100% 98%  Weight:      Height:        Intake/Output Summary (Last 24 hours) at 05/26/2020 [id number] Last data filed at 05/25/2020 2245 Gross per 24 hour  Intake 880 ml  Output 2150 ml  Net -1270 ml   Last 3 Weights 05/22/2020 05/14/2020 05/05/2020  Weight (lbs) 169 lb 15.6 oz 170 lb 163 lb  Weight (kg) 77.1 kg 77.111 kg 73.936 kg      Telemetry    Personally reviewed  No arrhythmia  1 AVB  ECG    No new- Personally Reviewed  Physical Exam  Well developed and nourished in no acute distress EEG wires in place  HENT normal Neck supple  Regular rate and rhythm, no murmurs or gallops Abd-soft with active BS No Clubbing cyanosis edema Skin-warm and dry A & Oriented  Grossly normal sensory and motor function     Labs    High Sensitivity Troponin:   Recent Labs  Lab 05/03/20 1400 05/03/20 1601 05/04/20 0626 05/22/20 1956 05/22/20 2322  TROPONINIHS 9 10 16  26* 27*       Chemistry Recent Labs  Lab 05/22/20 1956 05/23/20 0353 05/24/20 0317 05/25/20 0243 05/26/20 0323  NA 141   < > 138 139 137  K 3.6   < > 3.6 3.8 3.6  CL 108   < > 105 107 108  CO2 21*   < > 23 25 21*  GLUCOSE 97   < > 150* 115* 96  BUN 21   < > 22 19 12   CREATININE 1.08   < > 1.43* 1.25* 0.87  CALCIUM 9.5   < > 9.1 8.5* 8.8*  PROT 7.2  --   --   --   --   ALBUMIN 3.6  --   --   --   --   AST 16  --   --   --   --   ALT 28  --   --   --   --   ALKPHOS 53  --   --   --   --   BILITOT 0.7  --   --   --   --   GFRNONAA >60   < > 50* 59* >60  ANIONGAP 12   < > 10 7 8    < > = values  in this interval not displayed.     Hematology Recent Labs  Lab 05/24/20 0317 05/25/20 0243 05/26/20 0323  WBC 8.5 7.4 5.8  RBC 4.83 4.47 4.74  HGB 15.0 14.3 15.2  HCT 44.9 41.3 43.5  MCV 93.0 92.4 91.8  MCH 31.1 32.0 32.1  MCHC 33.4 34.6 34.9  RDW 14.3 14.5 14.0  PLT 141* 125* 129*    BNPNo results for input(s): BNP, PROBNP in the last 168 hours.   DDimer No results for input(s): DDIMER in the last 168 hours.   Radiology    No results found.  Cardiac Studies   No new    Assessment & Plan    Mr. Feeny is a 79 year old man with recurrent syncope.  He is recently worn a heart monitor which showed an 8-second period of complete heart block.  His situation is complex given there are elements of clear AV conduction disease but also elements of relatively recent diagnosis of seizure plus possible vasovagal syncope/orthostatic intolerance.  Neurologic work-up is ongoing.  Prior to discharge, we are planning to implant a permanent pacemaker given observed complete heart block.  I suspect he will be a good candidate for a closed-loop stimulation device from [employer]. Copied from 12/24  Stable reviewed plan    1.  Complete heart block   Pacemaker tentatively Monday Orders written     Signed, Virl Axe, MD  05/26/2020, 8:08 AM

## 2020-05-26 NOTE — Progress Notes (Addendum)
Entered patient's room to check patient from EEG alert call; Christopher NT at bedside with patient; patient stated he didn't feel well and needed a minute to stop; he had just completed doing his bath; patient put his head downward; speech was slurry and whispering; he wanted back into the bed; he was rigid with movement back to the bed; upon returning to bed with his head lowered he resolved quickly as vital signs were being obtained; did not capture a low BP; denies dizziness; denies pain.  Will alert MD to event; neurology will be called as well.

## 2020-05-26 NOTE — Progress Notes (Signed)
Called to patient's room by his wife; he was eating dinner then stopped due to another episode; he feels very dizzy; closing his eyes and becomes very quiet; doesn't speak.  Vital signs taken; patient kept safe in bed; episode resolved; EEG monitor was pressed during the event. Patient asks, "did I have another event?"; he doesn't remember.

## 2020-05-26 NOTE — Progress Notes (Addendum)
Received patient nurse with patient questions regarding how long EEG is to remain in place.  Advised her per neuro, plan for continuing EEG through Sunday.  Nurse also asks about IV maintenance fluid running at 117 mL/hour and if it can be discontinued.  I DC'd the maintenance fluids as he is tolerating p.o. intake quite well.  Follow-up creatinine trend tomorrow morning with BMP.  Creatinine today 0.87, down trended from 1.43 on 12/23.  Of note, home amlodipine restarted yesterday (12/24) due to hypertension.  BP during morning rounds soft at 90s over 60s.  Asked nurse to keep an eye on this since we are stopping maintenance IV fluids.  If blood pressure goes too low, will likely DC amlodipine.   If there are any questions or concerns, please contact the family medicine intern pager at [phone].  Ezequiel Essex, MD

## 2020-05-27 DIAGNOSIS — R569 Unspecified convulsions: Secondary | ICD-10-CM | POA: Diagnosis not present

## 2020-05-27 LAB — BASIC METABOLIC PANEL
Anion gap: 8 (ref 5–15)
BUN: 16 mg/dL (ref 8–23)
CO2: 23 mmol/L (ref 22–32)
Calcium: 9.1 mg/dL (ref 8.9–10.3)
Chloride: 107 mmol/L (ref 98–111)
Creatinine, Ser: 1.01 mg/dL (ref 0.61–1.24)
GFR, Estimated: >60 mL/min (ref >60)
Glucose, Bld: 103 mg/dL — ABNORMAL HIGH (ref 70–99)
Potassium: 3.6 mmol/L (ref 3.5–5.1)
Sodium: 138 mmol/L (ref 135–145)

## 2020-05-27 LAB — CBC
HCT: 43.1 % (ref 39.0–52.0)
Hemoglobin: 14.5 g/dL (ref 13.0–17.0)
MCH: 30.9 pg (ref 26.0–34.0)
MCHC: 33.6 g/dL (ref 30.0–36.0)
MCV: 91.7 fL (ref 80.0–100.0)
Platelets: 146 10*3/uL — ABNORMAL LOW (ref 150–400)
RBC: 4.7 MIL/uL (ref 4.22–5.81)
RDW: 13.7 % (ref 11.5–15.5)
WBC: 6.6 10*3/uL (ref 4.0–10.5)
nRBC: 0 % (ref 0.0–0.2)

## 2020-05-27 MED ORDER — ATORVASTATIN CALCIUM 40 MG PO TABS
40.0000 mg | ORAL_TABLET | Freq: Every day | ORAL | Status: DC
  Administered 2020-05-27 – 2020-05-31 (×5): 40 mg via ORAL
  Filled 2020-05-27 (×5): qty 1

## 2020-05-27 NOTE — Progress Notes (Addendum)
Family Medicine Teaching Service Daily Progress Note Intern Pager: [phone]  Patient name: Dustin Hamilton Medical record number: [id number] Date of birth: 1940/09/14 Age: 79 y.o. Gender: male  Primary Care Provider: [facility] Consultants: Cardiology, Neurology  Code Status: Full  Pt Overview and Major Events to Date:  12/21 Admitted 12/22 EEG completed 12/23-25 Continuous EEG  Assessment and Plan:  [name] a 79 y.o.malepresenting by EMS s/pseizure-likeepsiode.Of note, patient recently hospitalized 05/03/2020 with same presentation; workup at that time was inconclusive.PMH is significant forCVAin 2014, HTN, pSVT with AVNRT ablation in 11/2018, Mobitz Type 1 AV block, dizziness.  Seizure: acute, resolved Per neuro, plan to keep on continuous EEG through Sunday.  At that time, if no seizure activity detected from continuous EEG, neuro will conclude no epileptic etiology.  Per cardiology, plan for permanent pacemaker placement Monday with n.p.o. at midnight Sunday night. - Neurologyand cards following, appreciate continued care of his patient - Continuous cardiac monitoring, pulse ox - AM CBC to watch platelets, BMP to watch creatinine - PT/OT eval and treat - Seizure precautions/Fall precautions - Ativan 2mg  prn for generalized tonic-clonic seizure lasting >2 min, or focal seizure lasting >5min  - Neuro considering starting valproic acid if ictal-interictal abnormalities seen on EEG - consider aricept and/or namenda as etiology of these syncopal episodes and intermittent heart block - Cardiology suggested CTA head/neck as potentially helpful for determining etiology of episodes.  Right Ankle Pain: acute, stable Lateral and posterior right ankle acutely TTP.  No obvious swelling, deformity, redness, or warmth.  XR performed 12/21 demonstrates no acute abnormality.  Patient reports ankle actually feels better after movement and PT.  Unsure  etiology of right ankle pain; improvement after use incongruent with ligamentous injury.  - PT eval and treat -Voltaren gel -Tylenol p.o. as needed  Complete heart block Recent ZIO monitor showed multiple episodes of complete heart block with 8-9 seconds without ventricular contraction (3 episodes over 2 weeks)Potentiallyrelated to syncope  - continuous cardiac monitoring -Cardiologyfollowing,plan for pacemaker as above  Atrial Flutter  Home Eliquis 5 mg BID. Echo 12/03, with EF 15-40%, grade 1 diastolic dysfunction but no regional wall abnormalities.  - Holding home Eliquis  HTN: chronic, stable BP last 24 hours 114-156/82-93 with pulses 68-79.  Last 141/88 with pulse 72. -Resumedhomeamlodipine 5 mg 12/24, held previously due to soft BPs - Vitals per floor protocol  Hx Polycythemia Hx of Thrombocytopenia Currently resolved.Polycythemic at 17.6>16.6>15>14.3>15.2.Most recent baseline appears to be 15-16. Per chart review, patient with history of polycythemia as far back as 2008 and as high as 18.7 in 2017. Hx of thrombocytopenia on prior admission.Platelets on admission 153.  - Monitor with daily CBC  - Consider serum EPO vs outpatient work up  BPH: chronic, stable On Dutasteride 0.5 mg daily and Tamsulosin 0.4 mg daily. Patient with positive orthostatics on previous admission. - Continue Dutasteride 0.5 mgevery morning -HoldingTamsulosin 0.4 mg  History of CVA in 2014 Followed by Neurologist, Dr. [name].Previously on Plavix and ASA but per wife, was recently transitioned to just Eliquis. - PT/OT eval and treat -Holding Eliquis 5 mg BID - Consider starting Atorvastatin 40mg  at discharge   Glaucoma: chronic, stable Vision difficulties in left eye. - Continue Brimonidine 0.2% ophthalmic solution 1 drop both eyes BID  - Continue Latanoprost 0.005% ophthalmic solution, 1 drop both eyes QHS  Mild dementia: chronic, stable Follows with  Neurology, as above. On Namenda 17.5mg  QHS - Continue Namendanightly  FEN/GI:regular diet GQQ:PYPPJKD  Disposition: Home  Subjective:  No acute events overnight. Patient told me about 2 events that he experienced yesterday. States that he could not tell they were coming on. Denies any dizziness, chest pain, palpitations  Objective: Temp:  [97.4 F (36.3 C)-98.4 F (36.9 C)] 97.4 F (36.3 C) (12/26 0435) Pulse Rate:  [68-79] 73 (12/26 0435) Resp:  [16-21] 17 (12/26 0435) BP: (114-156)/(82-102) 114/84 (12/26 0435) SpO2:  [96 %-98 %] 97 % (12/26 0435) Physical Exam: General: alert, pleasant, NAD Cardiovascular: RRR no murmurs Respiratory: CTAB. Normal WOB Abdomen: soft, non-distended Extremities: moving spontaneously. No edema. R ankle TTP over lateral posterior area. No lesions, deformity, swelling, erythema   Laboratory: Recent Labs  Lab 05/25/20 0243 05/26/20 0323 05/27/20 0222  WBC 7.4 5.8 6.6  HGB 14.3 15.2 14.5  HCT 41.3 43.5 43.1  PLT 125* 129* 146*   Recent Labs  Lab 05/22/20 1956 05/23/20 0353 05/25/20 0243 05/26/20 0323 05/27/20 0222  NA 141   < > 139 137 138  K 3.6   < > 3.8 3.6 3.6  CL 108   < > 107 108 107  CO2 21*   < > 25 21* 23  BUN 21   < > 19 12 16   CREATININE 1.08   < > 1.25* 0.87 1.01  CALCIUM 9.5   < > 8.5* 8.8* 9.1  PROT 7.2  --   --   --   --   BILITOT 0.7  --   --   --   --   ALKPHOS 53  --   --   --   --   ALT 28  --   --   --   --   AST 16  --   --   --   --   GLUCOSE 97   < > 115* 96 103*   < > = values in this interval not displayed.    Imaging/Diagnostic Tests: None new   Shary Key, DO 05/27/2020, 7:21 AM PGY-1, [place] Intern pager: [phone], text pages welcome

## 2020-05-27 NOTE — Procedures (Signed)
Patient Name:Dustin Hamilton TZG:[id number] Epilepsy Attending:Hines Kloss Barbra Sarks Referring Physician/Provider:Dr Donnetta Simpers Duration:05/26/2020 2146 to 05/27/2020 1018  Patient history:79 y.o.malewith PMH significant for prior stroke in 2014, hx of SVT s/p ablation 6/20, hx of AV block, mild dementia whopresents today for evaluation of multiple spells that are clinically difficult to determine if they are partial complex seizures.The semiology of the event is as follows "leans forward -> expressive aphasia with intact awareness -> feels warm -> no post ictal confusion". He has about 2 events a day.EEG to evaluate for seizure  Level of alertness:Awake,asleep  AEDs during EEG study:None  Technical aspects: This EEG study was done with scalp electrodes positioned according to the 10-20 International system of electrode placement. Electrical activity was acquired at a sampling rate of 500Hz  and reviewed with a high frequency filter of 70Hz  and a low frequency filter of 1Hz . EEG data were recorded continuously and digitally stored.   Description: The posterior dominant rhythm consists of 9-10 Hz activity of moderate voltage (25-35 uV) seen predominantly in posterior head regions, symmetric and reactive to eye opening and eye closing.Sleep was characterized by vertex waves, sleep spindles (12-14hz ), maximal frontoHyperventilation and photic stimulation were not performed.   IMPRESSION: This study is within normal limits. No seizures or epileptiform discharges were seen throughout the recording.  Jezabelle Chisolm Barbra Sarks

## 2020-05-27 NOTE — Progress Notes (Signed)
Progress Note  Patient Name: Dustin Hamilton Date of Encounter: 05/27/2020  [facility] Cardiologist: Dustin Burow, MD    79 yo with complete heart block demonstrated on monitor; for pacing on Monday pending neuro workup   Subjective   The patient is asymptomatic he denies any episodes of presyncope or syncope overnight.  Inpatient Medications    Scheduled Meds: . amLODipine  5 mg Oral Daily  . brimonidine  1 drop Both Eyes BID  . dutasteride  0.5 mg Oral Daily  . enoxaparin (LOVENOX) injection  40 mg Subcutaneous Q24H  . [START ON 05/28/2020] gentamicin irrigation  80 mg Irrigation On Call  . latanoprost  1 drop Both Eyes QHS  . memantine  17.5 mg Oral QHS   Continuous Infusions: . [START ON 05/28/2020] sodium chloride    . [START ON 05/28/2020] sodium chloride    . [START ON 05/28/2020] vancomycin     PRN Meds: acetaminophen **OR** acetaminophen, diclofenac Sodium, polyethylene glycol   Vital Signs    Vitals:   05/26/20 1951 05/27/20 0001 05/27/20 0435 05/27/20 0827  BP: (!) 144/93 115/82 114/84 (!) 141/88  Pulse: 79 78 73 72  Resp: 18 16 17 17   Temp: 98.1 F (36.7 C) 98.4 F (36.9 C) (!) 97.4 F (36.3 C) 98 F (36.7 C)  TempSrc: Oral Oral Oral Oral  SpO2: 96% 97% 97% 98%  Weight:      Height:        Intake/Output Summary (Last 24 hours) at 05/27/2020 1023 Last data filed at 05/26/2020 2100 Gross per 24 hour  Intake 660 ml  Output 400 ml  Net 260 ml   Last 3 Weights 05/22/2020 05/14/2020 05/05/2020  Weight (lbs) 169 lb 15.6 oz 170 lb 163 lb  Weight (kg) 77.1 kg 77.111 kg 73.936 kg      Telemetry    Sinus rhythm, episodes of sinus bradycardia down to 40s with no pauses and no blocks overnight  - personally reviewed   ECG    No new- Personally Reviewed  Physical Exam   Well developed and nourished in no acute distress EEG wires in place  HENT normal Neck supple  Regular rate and rhythm, no murmurs or gallops Abd-soft with active  BS No Clubbing cyanosis edema Skin-warm and dry A & Oriented  Grossly normal sensory and motor function  Labs    High Sensitivity Troponin:   Recent Labs  Lab 05/03/20 1400 05/03/20 1601 05/04/20 0626 05/22/20 1956 05/22/20 2322  TROPONINIHS 9 10 16  26* 27*      Chemistry Recent Labs  Lab 05/22/20 1956 05/23/20 0353 05/25/20 0243 05/26/20 0323 05/27/20 0222  NA 141   < > 139 137 138  K 3.6   < > 3.8 3.6 3.6  CL 108   < > 107 108 107  CO2 21*   < > 25 21* 23  GLUCOSE 97   < > 115* 96 103*  BUN 21   < > 19 12 16   CREATININE 1.08   < > 1.25* 0.87 1.01  CALCIUM 9.5   < > 8.5* 8.8* 9.1  PROT 7.2  --   --   --   --   ALBUMIN 3.6  --   --   --   --   AST 16  --   --   --   --   ALT 28  --   --   --   --   ALKPHOS 53  --   --   --   --  BILITOT 0.7  --   --   --   --   GFRNONAA >60   < > 59* >60 >60  ANIONGAP 12   < > 7 8 8    < > = values in this interval not displayed.     Hematology Recent Labs  Lab 05/25/20 0243 05/26/20 0323 05/27/20 0222  WBC 7.4 5.8 6.6  RBC 4.47 4.74 4.70  HGB 14.3 15.2 14.5  HCT 41.3 43.5 43.1  MCV 92.4 91.8 91.7  MCH 32.0 32.1 30.9  MCHC 34.6 34.9 33.6  RDW 14.5 14.0 13.7  PLT 125* 129* 146*    BNPNo results for input(s): BNP, PROBNP in the last 168 hours.   DDimer No results for input(s): DDIMER in the last 168 hours.   Radiology    No results found.  Cardiac Studies   No new   Assessment & Plan    [name] is a 79 year old man with recurrent syncope.  He is recently worn a heart monitor which showed an 8-second period of complete heart block.  His situation is complex given there are elements of clear AV conduction disease but also elements of relatively recent diagnosis of seizure plus possible vasovagal syncope/orthostatic intolerance.  Neurologic work-up is ongoing.  Prior to discharge, we are planning to implant a permanent pacemaker given observed complete heart block.  I suspect he will be a good candidate for  a closed-loop stimulation device from [employer]. Copied from 12/24  Complete heart block The patient is asymptomatic, no further episodes of A-V block or pauses, he is in good spirits and ready for pacemaker implantation tomorrow. Pacemaker tentatively Monday Orders written     Signed, Ena Dawley, MD  05/27/2020, 10:23 AM

## 2020-05-27 NOTE — Progress Notes (Signed)
LTM EEG discontinued - no skin breakdown at unhook.   

## 2020-05-27 NOTE — Progress Notes (Signed)
Pt seen by Dr Meda Coffee who billed  I also saw the patient but deleted the note

## 2020-05-28 ENCOUNTER — Inpatient Hospital Stay (HOSPITAL_COMMUNITY)
Admission: EM | Disposition: A | Discharge status: Home Health | Payer: Self-pay | Source: Ambulatory Visit | Attending: Family Medicine

## 2020-05-28 ENCOUNTER — Institutional Professional Consult (permissible substitution): Payer: Medicare PPO | Admitting: Diagnostic Neuroimaging

## 2020-05-28 DIAGNOSIS — I442 Atrioventricular block, complete: Secondary | ICD-10-CM | POA: Diagnosis not present

## 2020-05-28 HISTORY — PX: PACEMAKER IMPLANT: EP1218

## 2020-05-28 LAB — CBC
HCT: 45.1 % (ref 39.0–52.0)
Hemoglobin: 15.8 g/dL (ref 13.0–17.0)
MCH: 31.7 pg (ref 26.0–34.0)
MCHC: 35 g/dL (ref 30.0–36.0)
MCV: 90.6 fL (ref 80.0–100.0)
Platelets: 145 10*3/uL — ABNORMAL LOW (ref 150–400)
RBC: 4.98 MIL/uL (ref 4.22–5.81)
RDW: 13.4 % (ref 11.5–15.5)
WBC: 5.6 10*3/uL (ref 4.0–10.5)
nRBC: 0 % (ref 0.0–0.2)

## 2020-05-28 LAB — BASIC METABOLIC PANEL
Anion gap: 6 (ref 5–15)
BUN: 15 mg/dL (ref 8–23)
CO2: 26 mmol/L (ref 22–32)
Calcium: 9.1 mg/dL (ref 8.9–10.3)
Chloride: 108 mmol/L (ref 98–111)
Creatinine, Ser: 1.01 mg/dL (ref 0.61–1.24)
GFR, Estimated: >60 mL/min (ref >60)
Glucose, Bld: 91 mg/dL (ref 70–99)
Potassium: 4 mmol/L (ref 3.5–5.1)
Sodium: 140 mmol/L (ref 135–145)

## 2020-05-28 SURGERY — PACEMAKER IMPLANT
Anesthesia: LOCAL

## 2020-05-28 MED ORDER — LIDOCAINE HCL 1 % IJ SOLN
INTRAMUSCULAR | Status: AC
  Filled 2020-05-28: qty 60

## 2020-05-28 MED ORDER — MIDAZOLAM HCL 5 MG/5ML IJ SOLN
INTRAMUSCULAR | Status: DC | PRN
  Administered 2020-05-28: 2 mg via INTRAVENOUS
  Administered 2020-05-28: 1 mg via INTRAVENOUS

## 2020-05-28 MED ORDER — SODIUM CHLORIDE 0.9 % IV SOLN
INTRAVENOUS | Status: AC | PRN
  Administered 2020-05-28: 999 mL/h via INTRAVENOUS

## 2020-05-28 MED ORDER — ONDANSETRON HCL 4 MG/2ML IJ SOLN: 4.0000 mg | Freq: Four times a day (QID) | INTRAMUSCULAR | Status: DC | PRN

## 2020-05-28 MED ORDER — MIDAZOLAM HCL 5 MG/5ML IJ SOLN
INTRAMUSCULAR | Status: AC
  Filled 2020-05-28: qty 5

## 2020-05-28 MED ORDER — SODIUM CHLORIDE 0.9 % IV SOLN
INTRAVENOUS | Status: AC
  Filled 2020-05-28: qty 2

## 2020-05-28 MED ORDER — LIDOCAINE HCL (PF) 1 % IJ SOLN
INTRAMUSCULAR | Status: DC | PRN
  Administered 2020-05-28: 45 mL

## 2020-05-28 MED ORDER — HEPARIN (PORCINE) IN NACL 1000-0.9 UT/500ML-% IV SOLN
INTRAVENOUS | Status: AC
  Filled 2020-05-28: qty 500

## 2020-05-28 MED ORDER — TAMSULOSIN HCL 0.4 MG PO CAPS
0.4000 mg | ORAL_CAPSULE | Freq: Every day | ORAL | Status: DC
  Administered 2020-05-29 – 2020-05-30 (×2): 0.4 mg via ORAL
  Filled 2020-05-28 (×3): qty 1

## 2020-05-28 MED ORDER — ACETAMINOPHEN 325 MG PO TABS
325.0000 mg | ORAL_TABLET | ORAL | Status: DC | PRN
  Administered 2020-05-29: 650 mg via ORAL
  Filled 2020-05-28: qty 2

## 2020-05-28 MED ORDER — FENTANYL CITRATE (PF) 100 MCG/2ML IJ SOLN
INTRAMUSCULAR | Status: AC
  Filled 2020-05-28: qty 2

## 2020-05-28 MED ORDER — HEPARIN (PORCINE) IN NACL 1000-0.9 UT/500ML-% IV SOLN
INTRAVENOUS | Status: DC | PRN
  Administered 2020-05-28: 500 mL

## 2020-05-28 MED ORDER — IOHEXOL 350 MG/ML SOLN
INTRAVENOUS | Status: DC | PRN
  Administered 2020-05-28: 10 mL

## 2020-05-28 MED ORDER — VANCOMYCIN HCL IN DEXTROSE 1-5 GM/200ML-% IV SOLN
INTRAVENOUS | Status: AC
  Filled 2020-05-28: qty 200

## 2020-05-28 MED ORDER — FENTANYL CITRATE (PF) 100 MCG/2ML IJ SOLN
INTRAMUSCULAR | Status: DC | PRN
  Administered 2020-05-28: 25 ug via INTRAVENOUS

## 2020-05-28 MED ORDER — VANCOMYCIN HCL IN DEXTROSE 1-5 GM/200ML-% IV SOLN
1000.0000 mg | Freq: Once | INTRAVENOUS | Status: AC
  Administered 2020-05-29: 1000 mg via INTRAVENOUS
  Filled 2020-05-28: qty 200

## 2020-05-28 SURGICAL SUPPLY — 11 items
CABLE SURGICAL S-101-97-12 (CABLE) ×2 IMPLANT
KIT ACCESSORY SELECTRA FIX CVD (MISCELLANEOUS) ×1 IMPLANT
LEAD SELECTRA 3D-55-42 (CATHETERS) ×2 IMPLANT
LEAD SOLIA S PRO MRI 53 (Lead) ×2 IMPLANT
LEAD SOLIA S PRO MRI 60 (Lead) ×1 IMPLANT
PACEMAKER EDORA 8DR-T MRI (Pacemaker) ×1 IMPLANT
PAD PRO RADIOLUCENT 2001M-C (PAD) ×2 IMPLANT
SHEATH 7FR PRELUDE SNAP 13 (SHEATH) ×1 IMPLANT
SHEATH 9FR PRELUDE SNAP 13 (SHEATH) ×1 IMPLANT
TRAY PACEMAKER INSERTION (PACKS) ×2 IMPLANT
WIRE HI TORQ VERSACORE-J 145CM (WIRE) ×1 IMPLANT

## 2020-05-28 NOTE — Care Management Important Message (Signed)
Important Message  Patient Details  Name: GUSTAF MCCARTER MRN: [id number] Date of Birth: [birth year]   Medicare Important Message Given:  Yes     Dorena Bodo 05/28/2020, 2:51 PM

## 2020-05-28 NOTE — Progress Notes (Signed)
Family Medicine Teaching Service Daily Progress Note Intern Pager: [phone]  Patient name: Dustin Hamilton Medical record number: [id number] Date of birth: August 04, 1940 Age: 79 y.o. Gender: male  Primary Care Provider: [facility] Consultants: Neuro, cardio Code Status: Full  Pt Overview and Major Events to Date:  12/21 Admitted 12/22 EEG completed 12/23-26 Continuous EEG  Assessment and Plan: Dustin Pare Edgertonis a 79 y.o.malepresenting by EMS s/pseizure-likeepsiode.Of note, patient recently hospitalized 05/03/2020 with same presentation; workup at that time was inconclusive.PMH is significant forCVAin 2014, HTN, pSVT with AVNRT ablation in 11/2018, Mobitz Type 1 AV block, dizziness.  Unresponsive episodes of unknown etiology  Patient doing well this morning, has no complaints.  No "spells" overnight.  Plan for permanent pacemaker placement today. Continuous EEG ended Sunday; two witnessed episodes this weekend, but not showing up on EEG as any seizure activity.  Given previous neuro note, will likely sign off today as no epileptic evidence on EEG.  Also CTA head/neck previously ordered for later today as creatinine has normalized; Cr today 1.01.  I am going to cancel his CTA order for today, given the need for fluoroscopy/contrast during pacemaker placement.  Likely recommend CTA for outpatient follow-up. - Neurologyand cards following, appreciate continued care of his patient - Continuous cardiac monitoring, pulse ox - AM CBCto watch platelets, BMPto watch creatinine - PT/OT eval and treat - Seizure precautions/Fall precautions - Ativan 2mg  prn for generalized tonic-clonic seizure lasting >2 min, or focal seizure lasting >58min  - Neuro considering starting valproic acid if ictal-interictal abnormalities seen on EEG - consider aricept and/or namenda as etiology of these syncopal episodes and intermittent heart block - Cardiology suggested CTA head/neck as  potentially helpful for determining etiology of episodes.  Right Ankle Pain: acute, stable Lateral and posterior right ankle acutely TTP. No obvious swelling, deformity, redness, or warmth. XR performed 12/21 demonstrates no acute abnormality. Patient reports ankle actually feels better after movement and PT. Unsure etiology of right ankle pain; improvement after use incongruent with ligamentous injury. - PT eval and treat -Voltaren gel -Tylenol p.o. as needed  Complete heart block Recent ZIO monitor showed multiple episodes of complete heart block with 8-9 seconds without ventricular contraction (3 episodes over 2 weeks).Potentiallyrelated to syncope. - continuous cardiac monitoring -Cardiologyfollowing,plan forpacemaker as above  Atrial Flutter  Home Eliquis 5 mg BID. Echo 12/03, with EF 0000000, grade 1 diastolic dysfunction but no regional wall abnormalities.  - Holding home Eliquis for pacemaker placement today  HTN: chronic, stable BP last 24 hours 125-141/83-24 with pulses 70-75.  Last BP 136/83, pulse 71. -Resumedhomeamlodipine 5 mg12/24, heldpreviouslydue to soft BPs - Vitals per floor protocol  Hx Polycythemia Hx of Thrombocytopenia Currently resolved. Today Hgb 14.5.Most recent baseline appears to be 15-16. Per chart review, patient with history of polycythemia as far back as 2008 and as high as 18.7 in 2017. Hx of thrombocytopenia on prior admission.Platelets on admission 153; fairly stable throughout admission with PLT 146 today. - Monitor with daily CBC  - Consider serum EPO vs outpatient work up  BPH: chronic, stable On Dutasteride 0.5 mg daily and Tamsulosin 0.4 mg daily. Patient with positive orthostatics on previous admission. - Continue Dutasteride 0.5 mgevery morning -HoldingTamsulosin 0.4 mg  History of CVA in 2014 Followed by Neurologist, Dr. [name].Previously on Plavix and ASA but per wife, was recently transitioned to  just Eliquis. - PT/OT eval and treat -Holding Eliquis 5 mg BID - Consider starting Atorvastatin 40mg  at discharge   Glaucoma: chronic, stable  Vision difficulties in left eye. - Continue Brimonidine 0.2% ophthalmic solution 1 drop both eyes BID  - Continue Latanoprost 0.005% ophthalmic solution, 1 drop both eyes QHS  Mild dementia: chronic, stable Follows with Neurology, as above. On Namenda 17.5mg  QHS - Continue Namendanightly   Status is: Inpatient  Remains inpatient appropriate because:Ongoing diagnostic testing needed not appropriate for outpatient work up and Inpatient level of care appropriate due to severity of illness   Dispo: The patient is from: Home              Anticipated d/c is to: Home              Anticipated d/c date is: 2 days              Patient currently is not medically stable to d/c.   Subjective:  Patient is doing well this morning, has no complaints.  Was found sitting upright in bed using his phone.  When asked if he knew what he was having done today, he said "I am having nothing place today" and placed his right hand up over his heart.  With prompting, he agreed that he was having a pacemaker placed today.  Expressed that he had no questions.  Objective: Temp:  [97.3 F (36.3 C)-98.4 F (36.9 C)] 98.4 F (36.9 C) (12/27 0414) Pulse Rate:  [70-75] 71 (12/27 0414) Resp:  [16-18] 18 (12/27 0414) BP: (125-141)/(83-94) 136/83 (12/27 0414) SpO2:  [96 %-98 %] 96 % (12/27 0414)  Physical Exam: General: Awake, alert, oriented, no acute distress Cardiovascular: Regular rate and rhythm, no murmurs appreciated Respiratory: Clear to auscultation bilaterally Abdomen: Nondistended Extremities: No BLE edema, continued right ankle TTP in lateral and posterior aspects  Laboratory: Recent Labs  Lab 05/25/20 0243 05/26/20 0323 05/27/20 0222  WBC 7.4 5.8 6.6  HGB 14.3 15.2 14.5  HCT 41.3 43.5 43.1  PLT 125* 129* 146*   Recent Labs  Lab  05/22/20 1956 05/23/20 0353 05/25/20 0243 05/26/20 0323 05/27/20 0222  NA 141   < > 139 137 138  K 3.6   < > 3.8 3.6 3.6  CL 108   < > 107 108 107  CO2 21*   < > 25 21* 23  BUN 21   < > 19 12 16   CREATININE 1.08   < > 1.25* 0.87 1.01  CALCIUM 9.5   < > 8.5* 8.8* 9.1  PROT 7.2  --   --   --   --   BILITOT 0.7  --   --   --   --   ALKPHOS 53  --   --   --   --   ALT 28  --   --   --   --   AST 16  --   --   --   --   GLUCOSE 97   < > 115* 96 103*   < > = values in this interval not displayed.    Imaging/Diagnostic Tests: None last 24 hours  , MD 05/28/2020, 7:17 AM PGY-1, [facility] Family Medicine [facility]PT[facility] Intern pager: [phone], text pages welcome

## 2020-05-28 NOTE — Progress Notes (Signed)
OT Cancellation Note  Patient Details Name: Dustin Hamilton MRN: [id number] DOB: 31-Jan-1941   Cancelled Treatment:    Reason Eval/Treat Not Completed: Patient at procedure or test/ unavailable (pace maker placement)   Fleeta Emmer, OTR/L  Acute Rehabilitation Services Pager: [phone] Office: [phone] .  Jeri Modena 05/28/2020, 2:07 PM

## 2020-05-28 NOTE — Progress Notes (Signed)
Pt back to unit from cath lab, pt resting comfortably in bed with call light within reach, sling remains on LUE, pacemaker site incision dsg remains clean, dry and intact with scant bloody stain to left upper chest. Will continue to closely monitor per protocol and order. Bed alarm. Dionne Bucy RN   05/28/20 1558  Vitals  Temp 97.7 F (36.5 C)  BP (!) 142/87  MAP (mmHg) 105  BP Location Right Arm  BP Method Automatic  Patient Position (if appropriate) Lying  Pulse Rate (!) 59  Pulse Rate Source Dinamap  Resp 16  Level of Consciousness  Level of Consciousness Alert  MEWS COLOR  MEWS Score Color Green  Oxygen Therapy  SpO2 100 %  O2 Device Room Air  MEWS Score  MEWS Temp 0  MEWS Systolic 0  MEWS Pulse 0  MEWS RR 0  MEWS LOC 0  MEWS Score 0

## 2020-05-28 NOTE — Progress Notes (Addendum)
Subjective: No further events overnight. Denies any concerns  ROS: negative except above Examination  Vital signs in last 24 hours: Temp:  [97.3 F (36.3 C)-98.4 F (36.9 C)] 98.1 F (36.7 C) (12/27 1158) Pulse Rate:  [66-75] 66 (12/27 1158) Resp:  [16-18] 16 (12/27 1158) BP: (130-153)/(81-94) 153/90 (12/27 1158) SpO2:  [96 %-98 %] 97 % (12/27 1355)  General: lying in bed, NAD CVS: pulse-normal rate and rhythm RS: breathing comfortably, CTAB Extremities: normal, warm  Neuro: MS: Alert, oriented, follows commands CN: pupils equal and reactive,  EOMI, face symmetric, tongue midline, normal sensation over face, Motor: 5/5 strength in all 4 extremities  Basic Metabolic Panel: Recent Labs  Lab 05/22/20 1956 05/23/20 0353 05/24/20 0317 05/25/20 0243 05/26/20 0323 05/27/20 0222 05/28/20 1116  NA 141   < > 138 139 137 138 140  K 3.6   < > 3.6 3.8 3.6 3.6 4.0  CL 108   < > 105 107 108 107 108  CO2 21*   < > 23 25 21* 23 26  GLUCOSE 97   < > 150* 115* 96 103* 91  BUN 21   < > 22 19 12 16 15   CREATININE 1.08   < > 1.43* 1.25* 0.87 1.01 1.01  CALCIUM 9.5   < > 9.1 8.5* 8.8* 9.1 9.1  MG 1.9  --   --   --   --   --   --    < > = values in this interval not displayed.    CBC: Recent Labs  Lab 05/22/20 1956 05/23/20 0353 05/24/20 0317 05/25/20 0243 05/26/20 0323 05/27/20 0222 05/28/20 1116  WBC 9.2   < > 8.5 7.4 5.8 6.6 5.6  NEUTROABS 6.9  --   --   --   --   --   --   HGB 17.6*   < > 15.0 14.3 15.2 14.5 15.8  HCT 52.9*   < > 44.9 41.3 43.5 43.1 45.1  MCV 94.1   < > 93.0 92.4 91.8 91.7 90.6  PLT 153   < > 141* 125* 129* 146* 145*   < > = values in this interval not displayed.     Coagulation Studies: No results for input(s): LABPROT, INR in the last 72 hours.  Imaging No new brain imaging overnight  ASSESSMENT AND PLAN:79 year old male with history of prior right-sided stroke in 2014, SVT status post ablation, mild dementia presents with episodes of  expressive aphasia without loss of consciousness.   Transient alteration of awareness - Multiple events recorded on video eeg without concomitant eeg change, no interictal abnormality either. Therefore, these events are unlikely to be epileptic -Differentials include BPPV, amyloid spells, cardiac related ( although tele normal during events so less likely)  Recommendations - Doesn't need to be on AEDs - As patient still has episodes of alteration of awareness, would recommend driving restriction for 6 months per [place] law - if episodes resolve after pacemaker, will not need further workup. - However, if episodes persist, consider referral to ENT for evaluation of BPPV - Can consider empiric trial of VPA 250mg  BID for possible amyloid spells if ENT workup is negative -Management of rest of comorbidities per primary team  Thank you for allowing us to participate in care of this patient. Neurology will sign off. Please call for any further questions.   I have spent a total of25 minuteswith the patient reviewing hospitalnotes, test results, labs and examining the patient as well as establishing  an assessment and plan that was discussed personally with the patient.>50% of time was spent in direct patient care.  Lindie Spruce Epilepsy [facility] For questions after 5pm please refer to AMION to reach the Neurologist on call

## 2020-05-28 NOTE — Progress Notes (Addendum)
Electrophysiology Rounding Note  Patient Name: Dustin Hamilton Date of Encounter: 05/28/2020  Primary Cardiologist: Quay Burow, MD Electrophysiologist: Cristopher Peru, MD   Subjective   Feeling well this am. Wife on phone. Questions answered re: PPM.  Inpatient Medications    Scheduled Meds: . amLODipine  5 mg Oral Daily  . atorvastatin  40 mg Oral Daily  . brimonidine  1 drop Both Eyes BID  . dutasteride  0.5 mg Oral Daily  . enoxaparin (LOVENOX) injection  40 mg Subcutaneous Q24H  . gentamicin irrigation  80 mg Irrigation On Call  . latanoprost  1 drop Both Eyes QHS  . memantine  17.5 mg Oral QHS   Continuous Infusions: . sodium chloride    . sodium chloride    . vancomycin     PRN Meds: acetaminophen **OR** acetaminophen, diclofenac Sodium, polyethylene glycol   Vital Signs    Vitals:   05/27/20 1557 05/27/20 2013 05/27/20 2340 05/28/20 0414  BP: 133/83 (!) 138/92 (!) 130/94 136/83  Pulse: 70 75 71 71  Resp: 17 16 17 18   Temp: 98.1 F (36.7 C) (!) 97.3 F (36.3 C) (!) 97.5 F (36.4 C) 98.4 F (36.9 C)  TempSrc: Oral Oral Oral Oral  SpO2: 97% 98% 98% 96%  Weight:      Height:        Intake/Output Summary (Last 24 hours) at 05/28/2020 [id number] Last data filed at 05/27/2020 2235 Gross per 24 hour  Intake 50 ml  Output 725 ml  Net -675 ml   Filed Weights   05/22/20 1638  Weight: 77.1 kg    Physical Exam    GEN- The patient is well appearing, alert and oriented x 3 today.   Head- normocephalic, atraumatic Eyes-  Sclera clear, conjunctiva pink Ears- hearing intact Oropharynx- clear Neck- supple Lungs- Clear to ausculation bilaterally, normal work of breathing Heart- Regular rate and rhythm, no murmurs, rubs or gallops GI- soft, NT, ND, + BS Extremities- no clubbing or cyanosis. No edema Skin- no rash or lesion Psych- euthymic mood, full affect Neuro- strength and sensation are intact  Labs    CBC Recent Labs    05/26/20 0323  05/27/20 0222  WBC 5.8 6.6  HGB 15.2 14.5  HCT 43.5 43.1  MCV 91.8 91.7  PLT 129* [id number]*   Basic Metabolic Panel Recent Labs    05/26/20 0323 05/27/20 0222  NA 137 138  K 3.6 3.6  CL 108 107  CO2 21* 23  GLUCOSE 96 103*  BUN 12 16  CREATININE 0.87 1.01  CALCIUM 8.8* 9.1   Liver Function Tests No results for input(s): AST, ALT, ALKPHOS, BILITOT, PROT, ALBUMIN in the last 72 hours. No results for input(s): LIPASE, AMYLASE in the last 72 hours. Cardiac Enzymes No results for input(s): CKTOTAL, CKMB, CKMBINDEX, TROPONINI in the last 72 hours.   Telemetry    NSR 60-70s (personally reviewed)  Radiology    No results found.  Patient Profile     Dustin Hamilton is a 79 y.o. male with a past medical history significant for recurrent syncope.  he was admitted for complete heart block noted on monitor.   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  Pt verbalized understanding and agrees to proceed if indicated.   2. Syncope / Spells EEG  this admission negative for seizures or epileptiform discharges His situation is complex given there are elements of clear AV conduction disease but also elements of relatively recent diagnosis of seizure plus possible vasovagal syncope/orthostatic intolerance We expect some, but not total improvement with pacing.  3. Questionable atrial flutter Noted on previous hospital stay but actually thought to be artifact per Dr. Lalla Brothers.  OAC has been stopped and will follow burden s/p pacing.   4. H/o SVT s/p slow pathway ablation 11/2018 Stable.  For questions or updates, please contact [facility] Please consult [email or web address] for contact info under Cardiology/STEMI.  Signed, Graciella Freer, PA-C  05/28/2020, 7:12 AM    EP attending  Patient seen and examined.  Agree with the findings as noted above.  The patient is well-known to me from a history of SVT status post catheter ablation.  For at least 7 years he has had spells.  These are characterized by occasional loss of consciousness, but at other times severe dizziness and lightheadedness.  His undergone extensive evaluation.  He has had transient complete heart block.  He has also had evaluation for seizures which was unrevealing.  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  However he has had documented prolonged pauses.  I had a careful discussion with the patient and his wife.  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  The benefits as well as the limitations of pacing for this condition were carefully outlined with the patient.  He is willing to proceed with pacemaker insertion.  The risk, goals, benefits, and expectations of the procedure were reviewed and he wishes to  Dorathy Daft

## 2020-05-28 NOTE — Progress Notes (Signed)
Pt off unit for a procedure. Delia Heady RN

## 2020-05-28 NOTE — Progress Notes (Signed)
Interim Progress Note  In to see patient with Dr. Allena Katz. Appears comfortable and in no distress after after pacemaker procedure. Dressing is clean, dry and intact. Denies pain. His only complaint is that he is hungry. Patient advised that he has a diet and can order food.

## 2020-05-29 ENCOUNTER — Telehealth: Payer: Self-pay

## 2020-05-29 ENCOUNTER — Encounter (HOSPITAL_COMMUNITY): Payer: Self-pay | Admitting: Internal Medicine

## 2020-05-29 ENCOUNTER — Inpatient Hospital Stay (HOSPITAL_COMMUNITY): Payer: Medicare PPO

## 2020-05-29 DIAGNOSIS — I442 Atrioventricular block, complete: Secondary | ICD-10-CM | POA: Diagnosis not present

## 2020-05-29 DIAGNOSIS — Z950 Presence of cardiac pacemaker: Secondary | ICD-10-CM

## 2020-05-29 DIAGNOSIS — M79672 Pain in left foot: Secondary | ICD-10-CM

## 2020-05-29 LAB — CBC
HCT: 44.3 % (ref 39.0–52.0)
Hemoglobin: 15.1 g/dL (ref 13.0–17.0)
MCH: 31 pg (ref 26.0–34.0)
MCHC: 34.1 g/dL (ref 30.0–36.0)
MCV: 91 fL (ref 80.0–100.0)
Platelets: 145 10*3/uL — ABNORMAL LOW (ref 150–400)
RBC: 4.87 MIL/uL (ref 4.22–5.81)
RDW: 13.2 % (ref 11.5–15.5)
WBC: 7 10*3/uL (ref 4.0–10.5)
nRBC: 0 % (ref 0.0–0.2)

## 2020-05-29 LAB — BASIC METABOLIC PANEL
Anion gap: 8 (ref 5–15)
BUN: 17 mg/dL (ref 8–23)
CO2: 23 mmol/L (ref 22–32)
Calcium: 9.3 mg/dL (ref 8.9–10.3)
Chloride: 105 mmol/L (ref 98–111)
Creatinine, Ser: 1 mg/dL (ref 0.61–1.24)
GFR, Estimated: >60 mL/min (ref >60)
Glucose, Bld: 112 mg/dL — ABNORMAL HIGH (ref 70–99)
Potassium: 3.8 mmol/L (ref 3.5–5.1)
Sodium: 136 mmol/L (ref 135–145)

## 2020-05-29 MED ORDER — METOPROLOL SUCCINATE ER 25 MG PO TB24
12.5000 mg | ORAL_TABLET | Freq: Every day | ORAL | Status: DC
  Administered 2020-05-29 – 2020-05-30 (×2): 12.5 mg via ORAL
  Filled 2020-05-29 (×2): qty 1

## 2020-05-29 MED ORDER — ASPIRIN EC 81 MG PO TBEC
81.0000 mg | DELAYED_RELEASE_TABLET | Freq: Every day | ORAL | Status: DC
  Administered 2020-05-29 – 2020-05-31 (×3): 81 mg via ORAL
  Filled 2020-05-29 (×3): qty 1

## 2020-05-29 MED FILL — Lidocaine HCl Local Inj 1%: INTRAMUSCULAR | Qty: 60 | Status: AC

## 2020-05-29 MED FILL — Lidocaine HCl Local Inj 1%: INTRAMUSCULAR | Qty: 45 | Status: CN

## 2020-05-29 NOTE — Telephone Encounter (Signed)
Pt would like to speak with you about this patient.

## 2020-05-29 NOTE — Progress Notes (Signed)
Occupational Therapy Treatment Patient Details Name: Dustin Hamilton MRN: [id number] DOB: 11-09-1940 Today's Date: 05/29/2020    History of present illness 79 y.o. male presenting by EMS s/p seizure-like epsiode. PMH is significant for CVA, HTN, pSVT with AVNRT ablation in 11/2018, Mobitz Type 1 AV block, dizziness. S/p pacemaker implantation 12/27.   OT comments  Pt currently with severe L foot pain that requires increased physical (A). RN notified at this time of this change in status. Pt high fall risk and remains CIR appropriate admission to reach MOD I.    Follow Up Recommendations  CIR    Equipment Recommendations  3 in 1 bedside commode    Recommendations for Other Services      Precautions / Restrictions Precautions Precautions: Fall Precaution Comments: pacemaker - no lifting, no raising arm above shoulder height, no pulling with LUE per PA Otilio Saber Required Braces or Orthoses: Sling       Mobility Bed Mobility Overal bed mobility: Needs Assistance             General bed mobility comments: oob in chair on arrival  Transfers Overall transfer level: Needs assistance Equipment used: Rolling walker (2 wheeled) Transfers: Sit to/from [employer] Sit to Stand: Mod assist;+2 physical assistance Stand pivot transfers: +2 physical assistance;Mod assist       General transfer comment: pt needs (A) to transfer due to LLE pain mentioned above during adls    Balance Overall balance assessment: Needs assistance Sitting-balance support: No upper extremity supported;Feet supported Sitting balance-Leahy Scale: Fair     Standing balance support: Bilateral upper extremity supported;During functional activity Standing balance-Leahy Scale: Poor Standing balance comment: reliant on surface due to LLE pain                           ADL either performed or assessed with clinical judgement   ADL Overall ADL's : Needs  assistance/impaired     Grooming: Minimal assistance;Standing Grooming Details (indicate cue type and reason): oral care with cues for safety and sling use. cues to not raise LUE Upper Body Bathing: Moderate assistance   Lower Body Bathing: Moderate assistance           Toilet Transfer: +2 for physical assistance;Maximal assistance           Functional mobility during ADLs: +2 for physical assistance;Maximal assistance General ADL Comments: pt progressed around the bed to sink level with increase pain to LLE. pt required mod- max to reach sink level. after oral care require total +2 mod (A) to turn to RW. OT holding patient max (A) for chair to be placed. Pt total +2 mod (A) to rise from chair surface when was min (A) prior     Vision       Perception     Praxis      Cognition Arousal/Alertness: Awake/alert Behavior During Therapy: WFL for tasks assessed/performed Overall Cognitive Status: Impaired/Different from baseline Area of Impairment: Safety/judgement                         Safety/Judgement: Decreased awareness of deficits     General Comments: poor recall of sternal precautions despite edcuation in PT session and then with written handout that was placed in Pacemaker box in room for reference.        Exercises General Exercises - Lower Extremity Ankle Circles/Pumps: AAROM;Left;5 reps;Seated   Shoulder Instructions  General Comments unable to clearly define onset of foot pain but aware with OT arrival for evalaution. OT calling PT to room to assess changes from prior session    Pertinent Vitals/ Pain       Pain Assessment: 0-10 Faces Pain Scale: Hurts whole lot Pain Location: L foot Pain Descriptors / Indicators: Sore;Sharp;Shooting Pain Intervention(s): Monitored during session;Repositioned  Home Living                                          Prior Functioning/Environment              Frequency  Min  2X/week        Progress Toward Goals  OT Goals(current goals can now be found in the care plan section)  Progress towards OT goals: Progressing toward goals  Acute Rehab OT Goals Patient Stated Goal: stop having foot pain OT Goal Formulation: With patient Time For Goal Achievement: 06/07/20 Potential to Achieve Goals: Good ADL Goals Pt Will Perform Grooming: Independently;standing Pt Will Perform Upper Body Bathing: Independently;sitting Pt Will Perform Lower Body Bathing: Independently;sit to/from stand Pt Will Perform Upper Body Dressing: Independently;sitting Pt Will Perform Lower Body Dressing: Independently;sit to/from stand Pt Will Transfer to Toilet: with modified independence;ambulating;bedside commode Pt Will Perform Toileting - Clothing Manipulation and hygiene: Independently;sit to/from stand Additional ADL Goal #1: Pt will be Independent in and OOB for basic ADLs Additional ADL Goal #2: Patient will complete 3/3 parts of toileting task with Mod I and no LOB.  Plan Discharge plan remains appropriate    Co-evaluation                 AM-PAC OT "6 Clicks" Daily Activity     Outcome Measure   Help from another person eating meals?: None Help from another person taking care of personal grooming?: A Little Help from another person toileting, which includes using toliet, bedpan, or urinal?: A Little Help from another person bathing (including washing, rinsing, drying)?: A Lot Help from another person to put on and taking off regular upper body clothing?: A Little Help from another person to put on and taking off regular lower body clothing?: A Lot 6 Click Score: 17    End of Session Equipment Utilized During Treatment: Gait belt;Rolling walker  OT Visit Diagnosis: Unsteadiness on feet (R26.81);Other abnormalities of gait and mobility (R26.89)   Activity Tolerance Patient limited by pain   Patient Left in chair;with call bell/phone within reach;with chair  alarm set   Nurse Communication Mobility status;Precautions        Time: [id number] OT Time Calculation (min): 26 min  Charges: OT General Charges $OT Visit: 1 Visit OT Treatments $Self Care/Home Management : 8-22 mins   Brynn, OTR/L  Acute Rehabilitation Services Pager: [phone] Office: [phone] .    Jeri Modena 05/29/2020, 3:33 PM

## 2020-05-29 NOTE — Progress Notes (Signed)
FPTS Interim Progress Note  Reach out to outpatient neurology provider Dr. Marjory Lies regarding patient's dual antiplatelet therapy.  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  During this admission, patient's Eliquis discontinued as no concern for A. fib per cardiology. Would like recommendations about restarting dual antiplatelet therapy. Dr. Danae Orleans recommends single antiplatelet thearpy for 2' stroke prevention with discontinuation of anticoagulation.   Will start ASA 81 daily.    Melene Plan, M.D.  12:59 PM 05/29/2020

## 2020-05-29 NOTE — Plan of Care (Signed)
  Problem: Clinical Measurements: Goal: Diagnostic test results will improve Outcome: Progressing   Problem: Clinical Measurements: Goal: Ability to maintain clinical measurements within normal limits will improve Outcome: Progressing   Problem: Health Behavior/Discharge Planning: Goal: Ability to manage health-related needs will improve Outcome: Progressing

## 2020-05-29 NOTE — Progress Notes (Signed)
Physical Therapy Treatment Patient Details Name: Dustin Hamilton MRN: [id number] DOB: [birth year] Today's Date: 05/29/2020    History of Present Illness 79 y.o. male presenting by EMS s/p seizure-like epsiode. PMH is significant for CVA, HTN, pSVT with AVNRT ablation in 11/2018, Mobitz Type 1 AV block, dizziness. S/p pacemaker implantation 12/27.    PT Comments    Pt returned to pt room for second session due to promptings from OT given pt's new onset of severe L foot pain. Pt now requiring mod +2 assist for transfers and pivoting, pt unable to step while PT present secondary to severe L foot pain. Pt describes pain as sharp, shooting, and is very tender to palpation along L plantar fascia and plantar surface of first ray. Based on pt mobility this session, PT recommending CIR post-acutely. CSM notified of updated recommendations.    Follow Up Recommendations  Supervision/Assistance - 24 hour;CIR     Equipment Recommendations  None recommended by PT (pt already owns a RW)    Recommendations for Other Services Rehab consult     Precautions / Restrictions Precautions Precautions: Fall Precaution Comments: pacemaker - no lifting, no raising arm above shoulder height, no pulling with LUE per PA Otilio Saber Required Braces or Orthoses: Sling (sling s/p PM placement, 24 hours post-procedure per verbal order from PA Otilio Saber) Restrictions Weight Bearing Restrictions: No    Mobility  Bed Mobility Overal bed mobility: Needs Assistance Bed Mobility: Supine to Sit     Supine to sit: Supervision     General bed mobility comments: up with OT at sink upon PT arrival  Transfers Overall transfer level: Needs assistance Equipment used: Rolling walker (2 wheeled) Transfers: Sit to/from Stand Sit to Stand: Mod assist;+2 physical assistance         General transfer comment: Mod +2 for power up from chair in front of bathroom sink, rise, and steadying, as well as slow and safe  eccentric lower into recliner.  Ambulation/Gait Ambulation/Gait assistance: Min guard;Supervision Gait Distance (Feet): 210 Feet Assistive device: Rolling walker (2 wheeled) Gait Pattern/deviations: Step-through pattern;Decreased stance time - left;Trunk flexed Gait velocity: decr   General Gait Details: unable -severe L foot pain   Stairs             Wheelchair Mobility    Modified Rankin (Stroke Patients Only) Modified Rankin (Stroke Patients Only) Pre-Morbid Rankin Score: Slight disability Modified Rankin: Moderately severe disability     Balance Overall balance assessment: Needs assistance Sitting-balance support: No upper extremity supported;Feet supported Sitting balance-Leahy Scale: Fair     Standing balance support: During functional activity;Bilateral upper extremity supported Standing balance-Leahy Scale: Poor Standing balance comment: reliant on external assist                            Cognition Arousal/Alertness: Awake/alert Behavior During Therapy: WFL for tasks assessed/performed Overall Cognitive Status: Within Functional Limits for tasks assessed                                 General Comments: direction-following limited by L foot pain      Exercises General Exercises - Lower Extremity Ankle Circles/Pumps: AAROM;Left;5 reps;Seated    General Comments General comments (skin integrity, edema, etc.): TTP along plantar fascia, plantar surface of 1st ray      Pertinent Vitals/Pain Pain Assessment: Faces Faces Pain Scale: Hurts whole lot Pain Location: L  foot Pain Descriptors / Indicators: Sore;Sharp;Shooting Pain Intervention(s): Limited activity within patient's tolerance;Monitored during session;Repositioned    Home Living                      Prior Function            PT Goals (current goals can now be found in the care plan section) Acute Rehab PT Goals Patient Stated Goal: stop having foot  pain PT Goal Formulation: With patient Time For Goal Achievement: 06/07/20 Potential to Achieve Goals: Good Progress towards PT goals: Progressing toward goals    Frequency    Min 3X/week      PT Plan Current plan remains appropriate    Co-evaluation              AM-PAC PT "6 Clicks" Mobility   Outcome Measure  Help needed turning from your back to your side while in a flat bed without using bedrails?: A Little Help needed moving from lying on your back to sitting on the side of a flat bed without using bedrails?: A Little Help needed moving to and from a bed to a chair (including a wheelchair)?: A Lot Help needed standing up from a chair using your arms (e.g., wheelchair or bedside chair)?: A Lot Help needed to walk in hospital room?: A Lot Help needed climbing 3-5 steps with a railing? : Total 6 Click Score: 13    End of Session Equipment Utilized During Treatment: Other (comment) (LUE sling, post-PM) Activity Tolerance: Patient limited by pain Patient left: with call bell/phone within reach;in chair;with chair alarm set Nurse Communication: Mobility status;Other (comment) (new onset severe L foot pain) PT Visit Diagnosis: Other abnormalities of gait and mobility (R26.89);Dizziness and giddiness (R42);Other symptoms and signs involving the nervous system (R29.898)     Time: [id number] PT Time Calculation (min) (ACUTE ONLY): 11 min  Charges:  Visit only                    Stacie Glaze, PT Acute Rehabilitation Services Pager [phone]  Office [phone]    [place] 05/29/2020, 1:03 PM

## 2020-05-29 NOTE — Progress Notes (Addendum)
FPTS Interim Progress Note  S: Received page regarding new onset of left foot pain. Per report, patient was unable to bare weight on his left foot while working with physical therapy. Patient endorses only pain. He denies numbness, tingling or other associated symptoms.  O: BP (!) 120/94 (BP Location: Right Arm)   Pulse 95   Temp 97.8 F (36.6 C) (Oral)   Resp 18   Ht 5\' 11"  (1.803 m)   Wt 77.1 kg   SpO2 98%   BMI 23.71 kg/m   MSK: tenderness on palpation along the dorsal aspect of the metatarsal area of the left foot, no erythema or warmth to touch noted Ext: no LE or pedal edema noted bilaterally, dorsalis pedis pulses intact bilaterally   A/P: -Given this acute onset, likely MSK in origin. Low concern for gout , DVT or infectious etiology given exam findings. Left foot x-ray ordered, will follow up. -Fall precautions and up with assistance  -Appreciate continued involvement and assistance from PT  , DO 05/29/2020, 12:16 PM PGY-1, [facility] Service pager [phone]

## 2020-05-29 NOTE — Progress Notes (Addendum)
Family Medicine Teaching Service Daily Progress Note Intern Pager: [phone]  Patient name: Dustin Hamilton Medical record number: [id number] Date of birth: Nov 27, 1940 Age: 79 y.o. Gender: male  Primary Care Provider: [facility] Consultants:  Neurology, Cardiology  Code Status: Full  Pt Overview and Major Events to Date:  12/21 Admitted 12/22 EEG completed 12/23-26 Continuous EEG  Assessment and Plan: Dustin Hamilton Edgertonis a 79 y.o.malepresenting by EMS s/pseizure-likeepsiode.Of note, patient recently hospitalized 05/03/2020 with same presentation; workup at that time was inconclusive.PMH is significant forCVAin 2014, HTN, pSVT with AVNRT ablation in 11/2018, Mobitz Type 1 AV block, dizziness.  Unresponsive episodes of unknown etiology Patient doing well and stable this morning. - Neurologywork up complete and has signed off -cards following, appreciate continued care of his patient, potential plan for CTA outpatient  - Continuous cardiac monitoring, pulse ox - PT/OT eval and treat - Seizure precautions/Fall precautions - Ativan 2mg  prn for generalized tonic-clonic seizure lasting >2 min, or focal seizure lasting >57min  -namenda 17.5 mg daily for syncopal episodes and intermittent heart block  Right Ankle Pain: acute, stable Lateral and posterior right ankle acutely TTP. No obvious swelling, deformity, redness, or warmth. XR performed 12/21 demonstrates no acute abnormality. Patient reports ankle actually feels better after movement and PT. Unsure etiology of right ankle pain; improvement after use incongruent with ligamentous injury. - PT eval and treat -Voltaren gel -Tylenol p.o. as needed  Complete heart block Recent ZIO monitor showed multiple episodes of complete heart block with 8-9 seconds without ventricular contraction (3 episodes over 2 weeks).Potentiallyrelated to syncope. - continuous cardiac monitoring -Cardiology outpatient  arranged per cardiology   Atrial Flutter HomeEliquis 5 mg BID. Echo 12/03, with EF 55-60%, grade 1 diastolic dysfunction but no regional wall abnormalities.  -Home Eliquis held due to pacemaker placement, cleared from cardiology perspective, spoke with Mr. 14/03, PA-C from cardiology who recommends continuing to hold Eliquis on discharge  HTN: chronic, stable BP 142/88. -Amlodipine 5 mg  -Continue to monitor  Hx Polycythemia Hx of Thrombocytopenia Currently resolved. Today Hgb 15.1 wnl.Most recent baseline appears to be 15-16. Per chart review, patient with history of polycythemia as far back as 2008 and as high as 18.7 in 2017. Hx of thrombocytopenia on prior admission.Platelets on admission 153; fairly stable throughout admission with PLT 145 today. - Monitor with daily CBC  - Consider serum EPO vs outpatient work up  BPH: chronic, stable On Dutasteride 0.5 mg daily and Tamsulosin 0.4 mg daily. Patient with positive orthostatics on previous admission.  - Continue Dutasteride 0.5 mgevery morning -Tamsulosin 0.4 mg  History of CVA in 2014 Followed by Neurologist, Dr. 2015.Previously on Plavix and ASA but per wife, was recently transitioned to just Eliquis. - PT/OT eval and treat -HoldingEliquis 5 mg BID - Consider starting Atorvastatin 40mg  at discharge  Glaucoma: chronic, stable Vision difficulties in left eye. - Continue Brimonidine 0.2% ophthalmic solution 1 drop both eyes BID  - Continue Latanoprost 0.005% ophthalmic solution, 1 drop both eyes QHS  Mild dementia: chronic, stable Follows with Neurology, as above. On Namenda 17.5mg  QHS - Continue Namendanightly   FEN/GI: heart healthy diet  PPx: lovenox    Status is: Inpatient   Dispo: The patient is from: Home              Anticipated d/c is to: Home  Subjective:  Patient doing very well this morning, reports no concerns and states he is  ready to go home with his wife.   Objective: Temp:  [97.4 F (36.3 C)-98.1 F (36.7 C)] 97.9 F (36.6 C) (12/28 0749) Pulse Rate:  [0-242] 75 (12/28 0749) Resp:  [12-20] 18 (12/28 0749) BP: (78-163)/(55-100) 142/88 (12/28 0749) SpO2:  [93 %-100 %] 98 % (12/28 0749) Physical Exam: General: Patient sitting upright and eating breakfast, in no acute distress. Cardiovascular: RRR, no murmurs or gallops auscultated  Respiratory: clear to auscultation bilaterally  Abdomen: soft, nontender, presence of active bowel sounds Extremities: no LE edema noted bilaterally, radial pulses strong and equal bilaterally   Laboratory: Recent Labs  Lab 05/27/20 0222 05/28/20 1116 05/29/20 0426  WBC 6.6 5.6 7.0  HGB 14.5 15.8 15.1  HCT 43.1 45.1 44.3  PLT 146* 145* 145*   Recent Labs  Lab 05/22/20 1956 05/23/20 0353 05/27/20 0222 05/28/20 1116 05/29/20 0426  NA 141   < > 138 140 136  K 3.6   < > 3.6 4.0 3.8  CL 108   < > 107 108 105  CO2 21*   < > 23 26 23   BUN 21   < > 16 15 17   CREATININE 1.08   < > 1.01 1.01 1.00  CALCIUM 9.5   < > 9.1 9.1 9.3  PROT 7.2  --   --   --   --   BILITOT 0.7  --   --   --   --   ALKPHOS 53  --   --   --   --   ALT 28  --   --   --   --   AST 16  --   --   --   --   GLUCOSE 97   < > 103* 91 112*   < > = values in this interval not displayed.      Imaging/Diagnostic Tests: DG Chest 2 View  Result Date: 05/29/2020 CLINICAL DATA:  Pacemaker insertion. EXAM: CHEST - 2 VIEW COMPARISON:  Chest x-ray 05/22/2020. FINDINGS: Process superior mediastinum consistent known goiter. Cardiac pacer with lead tips over the right atrium right ventricle. Heart size normal. Low lung volumes with mild bibasilar atelectasis. No pleural effusion or pneumothorax. IMPRESSION: 1. Cardiac pacer with lead tips over right atrium and right ventricle. Heart size normal. 2. Low lung volumes with mild bibasilar atelectasis. Electronically Signed   By: Marcello Moores  Register   On: 05/29/2020  06:32   EP PPM/ICD IMPLANT  Result Date: 05/28/2020 CONCLUSIONS:  1. Successful implantation of a Biotronik dual-chamber pacemaker for symptomatic bradycardia due to intermittent CHB  2. No early apparent complications.       Cristopher Peru, MD 05/28/2020 4:06 PM    Donney Dice, DO 05/29/2020, 8:06 AM PGY-1, [facility] Intern pager: [phone], text pages welcome

## 2020-05-29 NOTE — Progress Notes (Signed)
Physical Therapy Treatment Patient Details Name: Dustin Hamilton MRN: [id number] DOB: 1941/05/28 Today's Date: 05/29/2020    History of Present Illness 79 y.o. male presenting by EMS s/p seizure-like epsiode. PMH is significant for CVA, HTN, pSVT with AVNRT ablation in 11/2018, Mobitz Type 1 AV block, dizziness. S/p pacemaker implantation 12/27.    PT Comments    Pt seen s/p pacemaker implantation on 12/27. Pt ambulatory in hallway with use of RW and min guard to supervision level of assist for safety. PT reinforcing using LUE on RW to steady only, and to avoid pushing/pulling with LUE. Pt with tachycardia up to 130s bpm with mobility, resting HR 80s-100, PA notified via telephone. PT reviewed pacemaker precautions with pt, who was able to state them back to PT at end of session with min cues. PT feels pt is appropriate to d/c home with assist of wife and friends, per pt his wife typically uses a RW so PT encouraged pt to ask friends/neighbors to assist with mobility as needed for first couple of days after d/c from acute setting. Pt states he will call one of his friends to assist at home. PT to continue to follow acutely.    Follow Up Recommendations  Home health PT;Supervision/Assistance - 24 hour     Equipment Recommendations  None recommended by PT (pt already owns a RW)    Recommendations for Other Services       Precautions / Restrictions Precautions Precautions: Fall Precaution Comments: pacemaker - no lifting, no raising arm above shoulder height, no pulling with LUE per PA Otilio Saber Required Braces or Orthoses: Sling (sling s/p PM placement, 24 hours post-procedure per verbal order from PA Otilio Saber) Restrictions Weight Bearing Restrictions: No    Mobility  Bed Mobility Overal bed mobility: Needs Assistance Bed Mobility: Supine to Sit     Supine to sit: Supervision     General bed mobility comments: for safety, increased time and effort to  perform.  Transfers Overall transfer level: Needs assistance Equipment used: Rolling walker (2 wheeled);Straight cane Transfers: Sit to/from Stand Sit to Stand: Min assist;Min guard;From elevated surface         General transfer comment: Min assist for stand with use of SPC to rise and steady, pt unable to progress to dynamic standing with use of cane. Min guard for safety with use of RW. STS x4 from EOB, elevated bed height.  Ambulation/Gait Ambulation/Gait assistance: Min guard;Supervision Gait Distance (Feet): 210 Feet Assistive device: Rolling walker (2 wheeled) Gait Pattern/deviations: Step-through pattern;Decreased stance time - left;Trunk flexed Gait velocity: decr   General Gait Details: min guard initially, transitioning to supervision for safety. Pt resting LUE on RW to steady self, cues for not putting weight through LUE (sling loosened for pt to steady self with use of AD)   Stairs             Wheelchair Mobility    Modified Rankin (Stroke Patients Only) Modified Rankin (Stroke Patients Only) Pre-Morbid Rankin Score: Slight disability Modified Rankin: Moderately severe disability     Balance Overall balance assessment: Needs assistance Sitting-balance support: No upper extremity supported;Feet supported Sitting balance-Leahy Scale: Fair     Standing balance support: During functional activity;Bilateral upper extremity supported Standing balance-Leahy Scale: Poor Standing balance comment: reliant on external assist                            Cognition Arousal/Alertness: Awake/alert Behavior During Therapy: [place]  for tasks assessed/performed Overall Cognitive Status: Within Functional Limits for tasks assessed                                 General Comments: lacks some insight into safety and mobility deficits, states his wife can help him during mobility but she has mobility issues/uses RW      Exercises      General  Comments        Pertinent Vitals/Pain Pain Assessment: No/denies pain Faces Pain Scale: No hurt Pain Intervention(s): Limited activity within patient's tolerance;Monitored during session    Home Living                      Prior Function            PT Goals (current goals can now be found in the care plan section) Acute Rehab PT Goals Patient Stated Goal: to not have episodes PT Goal Formulation: With patient Time For Goal Achievement: 06/07/20 Potential to Achieve Goals: Good Progress towards PT goals: Progressing toward goals    Frequency    Min 3X/week      PT Plan Current plan remains appropriate    Co-evaluation              AM-PAC PT "6 Clicks" Mobility   Outcome Measure  Help needed turning from your back to your side while in a flat bed without using bedrails?: None Help needed moving from lying on your back to sitting on the side of a flat bed without using bedrails?: None Help needed moving to and from a bed to a chair (including a wheelchair)?: A Little Help needed standing up from a chair using your arms (e.g., wheelchair or bedside chair)?: A Little Help needed to walk in hospital room?: A Little Help needed climbing 3-5 steps with a railing? : A Little 6 Click Score: 20    End of Session Equipment Utilized During Treatment: Other (comment) (LUE sling, post-PM) Activity Tolerance: Patient tolerated treatment well;Other (comment) (tachycardia with mobility, PA notified) Patient left: with call bell/phone within reach;in chair;with chair alarm set Nurse Communication: Mobility status PT Visit Diagnosis: Other abnormalities of gait and mobility (R26.89);Dizziness and giddiness (R42);Other symptoms and signs involving the nervous system (R29.898)     Time: 0922-0950 PT Time Calculation (min) (ACUTE ONLY): 28 min  Charges:  $Gait Training: 8-22 mins $Therapeutic Activity: 8-22 mins                     Stacie Glaze, PT Acute  Rehabilitation Services Pager [phone]  Office [phone]    [place] 05/29/2020, 10:15 AM

## 2020-05-29 NOTE — Progress Notes (Signed)
FPTS Interim Progress Note  Spoke with patient's wife over the phone to provide update that patient will not be discharged due to new onset left foot pain. Explained that we will be doing further imaging to evaluate at this time. She voices understanding and is in agreement.  Reece Leader, DO 05/29/2020, 2:18 PM PGY-1, [facility] Family Medicine Service pager [phone]

## 2020-05-29 NOTE — Progress Notes (Signed)
RN assess pt pacemaker site to have some swelling but dsg remains unremarkable, with no active bleeding or oozing noted. PA Tillery called and notified, PA is aware of the swelling and said pt not on blood thinner and pt is ok to be discharge from their standpoint. Will continue to closely monitor pt. Dionne Bucy RN

## 2020-05-29 NOTE — Progress Notes (Addendum)
Electrophysiology Rounding Note  Patient Name: Dustin Hamilton Date of Encounter: 05/29/2020  Primary Cardiologist: Quay Burow, MD Electrophysiologist: Cristopher Peru, MD   Subjective   S/p Biotronik dual chamber PPM 12/27 CXR this am with stable lead placement and no pneumothorax.  The patient is doing well today.  At this time, the patient denies chest pain, shortness of breath, or any new concerns.  Inpatient Medications    Scheduled Meds:  amLODipine  5 mg Oral Daily   atorvastatin  40 mg Oral Daily   brimonidine  1 drop Both Eyes BID   dutasteride  0.5 mg Oral Daily   enoxaparin (LOVENOX) injection  40 mg Subcutaneous Q24H   latanoprost  1 drop Both Eyes QHS   memantine  17.5 mg Oral QHS   tamsulosin  0.4 mg Oral QHS   Continuous Infusions:   PRN Meds: [DISCONTINUED] acetaminophen **OR** acetaminophen, acetaminophen, diclofenac Sodium, ondansetron (ZOFRAN) IV, polyethylene glycol   Vital Signs    Vitals:   05/28/20 1845 05/28/20 2031 05/29/20 0038 05/29/20 0432  BP: (!) 148/99 (!) 141/95 110/72 119/83  Pulse: 86 93 81 80  Resp: 16 17 18 18   Temp: 97.6 F (36.4 C) 97.8 F (36.6 C) (!) 97.4 F (36.3 C) 97.9 F (36.6 C)  TempSrc: Oral Oral Oral Oral  SpO2: 97% 97% 96% 96%  Weight:      Height:        Intake/Output Summary (Last 24 hours) at 05/29/2020 0723 Last data filed at 05/29/2020 0500 Gross per 24 hour  Intake 2393 ml  Output 1200 ml  Net 1193 ml   Filed Weights   05/22/20 1638  Weight: 77.1 kg    Physical Exam    GEN- The patient is well appearing, alert and oriented x 3 today.   Head- normocephalic, atraumatic Eyes-  Sclera clear, conjunctiva pink Ears- hearing intact Oropharynx- clear Neck- supple Lungs- Clear to ausculation bilaterally, normal work of breathing Heart- Regular rate and rhythm, no murmurs, rubs or gallops. PPM site stable. Small area of skin breakdown on outer margin of tegaderm (removed) GI- soft, NT, ND, +  BS Extremities- no clubbing or cyanosis. No edema Skin- no rash or lesion Psych- euthymic mood, full affect Neuro- strength and sensation are intact  Labs    CBC Recent Labs    05/28/20 1116 05/29/20 0426  WBC 5.6 7.0  HGB 15.8 15.1  HCT 45.1 44.3  MCV 90.6 91.0  PLT 145* [id number]*   Basic Metabolic Panel Recent Labs    05/28/20 1116 05/29/20 0426  NA 140 136  K 4.0 3.8  CL 108 105  CO2 26 23  GLUCOSE 91 112*  BUN 15 17  CREATININE 1.01 1.00  CALCIUM 9.1 9.3   Liver Function Tests No results for input(s): AST, ALT, ALKPHOS, BILITOT, PROT, ALBUMIN in the last 72 hours. No results for input(s): LIPASE, AMYLASE in the last 72 hours. Cardiac Enzymes No results for input(s): CKTOTAL, CKMB, CKMBINDEX, TROPONINI in the last 72 hours.   Telemetry    NSR 50-60s (personally reviewed)  Radiology    DG Chest 2 View  Result Date: 05/29/2020 CLINICAL DATA:  Pacemaker insertion. EXAM: CHEST - 2 VIEW COMPARISON:  Chest x-ray 05/22/2020. FINDINGS: Process superior mediastinum consistent known goiter. Cardiac pacer with lead tips over the right atrium right ventricle. Heart size normal. Low lung volumes with mild bibasilar atelectasis. No pleural effusion or pneumothorax. IMPRESSION: 1. Cardiac pacer with lead tips over right atrium and right  ventricle. Heart size normal. 2. Low lung volumes with mild bibasilar atelectasis. Electronically Signed   By: Maisie Fus  Register   On: 05/29/2020 06:32   EP PPM/ICD IMPLANT  Result Date: 05/28/2020 CONCLUSIONS:  1. Successful implantation of a Biotronik dual-chamber pacemaker for symptomatic bradycardia due to intermittent CHB  2. No early apparent complications.       Lewayne Bunting, MD 05/28/2020 4:06 PM    Patient Profile     EEAN BUSS is a 79 y.o. male with a past medical history significant for recurrent syncope.  he was admitted for complete heart block noted on monitor.   Assessment & Plan    1. Intermittent CHB s/p dual  chamber Biotronik PPM 12/27 LAFB on EKG, QRS 112 ms   2. Syncope / Spells EEG this admission negative for seizures or epileptiform discharges His situation is complex given there are elements of clear AV conduction disease but also elements of relatively recent diagnosis of seizure plus possible vasovagal syncope/orthostatic intolerance Expect some improvement with pacing, but likely not complete resolution.  We expect some, but not total improvement with pacing.   3. Questionable atrial flutter Per previous discussions would hold [facility] for now and plan resumption if AF/AFL noted on device.    4. H/o SVT s/p slow pathway ablation 11/2018 Stable. Will resume his low dose BB now s/p PPM.  He is stable from an EP perspective for discharge. Follow up has been arranged.   For questions or updates, please contact [facility] Please consult [email or web address] for contact info under Cardiology/STEMI.  Signed, Graciella Freer, PA-C  05/29/2020, 7:23 AM   EP attending  Patient seen and examined.  Agree with the findings as noted above.  The patient is status post permanent pacemaker insertion and is doing well.  Chest x-ray demonstrates normal lead position and no pneumothorax.  Pacemaker interrogation under my direct supervision demonstrates normal dual-chamber pacemaker function.  He will be discharged home with usual follow-up.  Lewayne Bunting, MD

## 2020-05-30 ENCOUNTER — Inpatient Hospital Stay (HOSPITAL_COMMUNITY): Payer: Medicare PPO

## 2020-05-30 DIAGNOSIS — M79672 Pain in left foot: Secondary | ICD-10-CM | POA: Diagnosis not present

## 2020-05-30 LAB — URIC ACID: Uric Acid, Serum: 7.1 mg/dL (ref 3.7–8.6)

## 2020-05-30 MED ORDER — COLCHICINE 0.6 MG PO TABS
0.6000 mg | ORAL_TABLET | Freq: Every day | ORAL | Status: DC
  Administered 2020-05-30 – 2020-05-31 (×2): 0.6 mg via ORAL
  Filled 2020-05-30 (×2): qty 1

## 2020-05-30 MED ORDER — ACETAMINOPHEN 325 MG PO TABS
650.0000 mg | ORAL_TABLET | ORAL | Status: DC
  Administered 2020-05-30 – 2020-05-31 (×6): 650 mg via ORAL
  Filled 2020-05-30 (×6): qty 2

## 2020-05-30 NOTE — Progress Notes (Signed)
S: Called to patient room due to episode of non-responsiveness similar to what he presented with for admission. He did not respond to questions for a few moments but quickly returned back to baseline without any deficits and "felt fine". Patient reports he has no issues and that he feels the same as he did earlier in the day.  O: Blood pressure 101/71, pulse 75, temperature 97.7 F (36.5 C), temperature source Oral, resp. rate 18, height 5\' 11"  (1.803 m), weight 77.1 kg, SpO2 98 %. Gen: NAD, wife at bedside MSK: ice pack present on pacemaker site, separate ice pack on left foot  A/P: Spells: Patient has had extensive work-up with neurology. 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 Patient is stable and has no residual effects. - Continue to monitor   Acute left foot pain: X rays negative. Unable to get MRI scan of the foot due to recent pacemaker placement (per RT there is a 6 week waiting period after implantation). Consideration that this could be gout due to extreme joint pain with sudden onset and swelling. - Starting colchicine 0.6mg  daily - If not improving in the next day or so, will need to search for another possible cause of this acute foot pain   Ailynn Gow, DO

## 2020-05-30 NOTE — Progress Notes (Addendum)
Occupational Therapy Treatment Patient Details Name: Dustin Hamilton MRN: [id number] DOB: [birth year] Today's Date: 05/30/2020    History of present illness 79 y.o. male presenting by EMS s/p seizure-like epsiode. PMH is significant for CVA, HTN, pSVT with AVNRT ablation in 11/2018, Mobitz Type 1 AV block, dizziness. S/p pacemaker implantation 12/27.   OT comments  Pt demonstrates cognitive deficits this session by poor recall of precautions for pacemaker, lack of awareness to ice pack with melted ice, lack of awareness to incontinence and poor return demo of dressing into a new gown. Pt could greatly benefit from continued therapy prior to home. Pt with LLE pain redness and requires mod (A) for compendatory strategies to progress.   Pt still unable to tolerate don of shoes at this time.    Follow Up Recommendations  CIR    Equipment Recommendations  3 in 1 bedside commode    Recommendations for Other Services Rehab consult    Precautions / Restrictions Precautions Precautions: Fall Precaution Comments: pacemaker - no lifting, no raising arm above shoulder height, no pulling with LUE per PA Otilio Saber Required Braces or Orthoses: Sling Restrictions Weight Bearing Restrictions: No       Mobility Bed Mobility Overal bed mobility: Needs Assistance  Transfers Overall transfer level: Needs assistance Equipment used: Rolling walker (2 wheeled) Transfers: Sit to/from Stand Sit to Stand: Min assist         General transfer comment: Practiced shifting weight anteriorly onto feet sitting EOB prior to transfer to inc tolerance to weight bearing to inc safety. MinA to come to stand and steady pt due to minor LOB due to pain with weight bearing on L foot. Cues for hand placement    Balance Overall balance assessment: Needs assistance Sitting-balance support: No upper extremity supported;Feet supported Sitting balance-Leahy Scale: Good Sitting balance - Comments: Able to donn  R sock in stting with min guard for safety.   Standing balance support: Bilateral upper extremity supported;During functional activity Standing balance-Leahy Scale: Poor Standing balance comment: reliant on UE support due to L foot pain.                           ADL either performed or assessed with clinical judgement   ADL Overall ADL's : Needs assistance/impaired                         Toilet Transfer: Minimal assistance;Ambulation;BSC;RW Toilet Transfer Details (indicate cue type and reason): unable to void   Toileting - Clothing Manipulation Details (indicate cue type and reason): incontinence on gown and bed and lacks awareness     Functional mobility during ADLs: Minimal assistance;Rolling walker General ADL Comments: pt encouraged to walk on the lateral aspect of foot and heal to help transfer. pt able to achieve with max cues     Vision       Perception     Praxis      Cognition Arousal/Alertness: Awake/alert Behavior During Therapy: WFL for tasks assessed/performed Overall Cognitive Status: Impaired/Different from baseline Area of Impairment: Safety/judgement                     Memory: Decreased short-term memory   Safety/Judgement: Decreased awareness of deficits;Decreased awareness of safety   Problem Solving: Slow processing General Comments: Difficulty with recalling precautions quickly, required repeated cues to avoid placing weight through L UE throughout.  Exercises     Shoulder Instructions       General Comments Noted warmth, swelling, and redness at L MP joint; pain with palpation, PROM, and AROM at MP joint but not on rest of foot/ankle    Pertinent Vitals/ Pain       Pain Assessment: Faces Faces Pain Scale: Hurts whole lot Pain Location: L foot Pain Descriptors / Indicators: Sore;Sharp;Shooting Pain Intervention(s): Limited activity within patient's tolerance;Monitored during  session;Repositioned  Home Living                                          Prior Functioning/Environment              Frequency  Min 2X/week        Progress Toward Goals  OT Goals(current goals can now be found in the care plan section)  Progress towards OT goals: Not progressing toward goals - comment  Acute Rehab OT Goals Patient Stated Goal: stop having foot pain OT Goal Formulation: Patient unable to participate in goal setting Time For Goal Achievement: 06/07/20 Potential to Achieve Goals: Good ADL Goals Pt Will Perform Grooming: Independently;standing Pt Will Perform Upper Body Bathing: Independently;sitting Pt Will Perform Lower Body Bathing: Independently;sit to/from stand Pt Will Perform Upper Body Dressing: Independently;sitting Pt Will Perform Lower Body Dressing: Independently;sit to/from stand Pt Will Transfer to Toilet: with modified independence;ambulating;bedside commode Pt Will Perform Toileting - Clothing Manipulation and hygiene: Independently;sit to/from stand Additional ADL Goal #1: Pt will be Independent in and OOB for basic ADLs Additional ADL Goal #2: Patient will complete 3/3 parts of toileting task with Mod I and no LOB.  Plan Discharge plan remains appropriate    Co-evaluation                 AM-PAC OT "6 Clicks" Daily Activity     Outcome Measure   Help from another person eating meals?: None Help from another person taking care of personal grooming?: A Little Help from another person toileting, which includes using toliet, bedpan, or urinal?: A Little Help from another person bathing (including washing, rinsing, drying)?: A Lot Help from another person to put on and taking off regular upper body clothing?: A Little Help from another person to put on and taking off regular lower body clothing?: A Lot 6 Click Score: 17    End of Session Equipment Utilized During Treatment: Gait belt;Rolling walker;Other  (comment) (L UE sling)  OT Visit Diagnosis: Unsteadiness on feet (R26.81);Other abnormalities of gait and mobility (R26.89)   Activity Tolerance Patient tolerated treatment well   Patient Left in chair;with call bell/phone within reach;with chair alarm set   Nurse Communication Mobility status;Precautions        Time: 1400-1415 OT Time Calculation (min): 15 min  Charges: OT General Charges $OT Visit: 1 Visit OT Treatments $Self Care/Home Management : 8-22 mins   Brynn, OTR/L  Acute Rehabilitation Services Pager: [phone] Office: [phone] .    Jeri Modena 05/30/2020, 3:42 PM

## 2020-05-30 NOTE — Progress Notes (Signed)
Physical Therapy Treatment Patient Details Name: Dustin Hamilton MRN: [id number] DOB: [birth year] Today's Date: 05/30/2020    History of Present Illness 79 y.o. male presenting by EMS s/p seizure-like epsiode. PMH is significant for CVA, HTN, pSVT with AVNRT ablation in 11/2018, Mobitz Type 1 AV block, dizziness. S/p pacemaker implantation 12/27.    PT Comments    Pt making progress this date compared to more recent session as he was able to tolerate placing more weight on his L foot this date allowing him to ambulate. However, he continues to demonstrate pain in his L 1st toe MP joint with palpation, AROM, and PROM but no pain palpating any other places of the L foot or with ankle AROM. Redness, warmth with palpation, and swelling were also noted at L 1st toe MP joint. Communicated these findings with the RN. Practiced placing weight on L foot in sitting prior to coming to stand to decrease risk for LOB and injury, with noted success. Pt able to come to sit EOB with supervision, come to stand with minA, and ambulate ~200 ft with a RW with min guard-minA this date. Due to pain, pt initially was avoiding weight shifting to the L but with cues pt increased bilat step length and stance time symmetry as distance progressed. Pt cued to place weight on L heel and lateral aspect of foot to avoid pain, with success. Will continue to follow acutely. To maximize pt safety and independence with functional mobility pt would benefit from intensive therapies in the CIR setting prior to returning home.   Follow Up Recommendations  Supervision/Assistance - 24 hour;CIR     Equipment Recommendations  None recommended by PT    Recommendations for Other Services Rehab consult     Precautions / Restrictions Precautions Precautions: Fall Precaution Comments: pacemaker - no lifting, no raising arm above shoulder height, no pulling with LUE per PA Oda Kilts Required Braces or Orthoses:  Sling Restrictions Weight Bearing Restrictions: No    Mobility  Bed Mobility Overal bed mobility: Needs Assistance Bed Mobility: Supine to Sit     Supine to sit: Supervision;HOB elevated     General bed mobility comments: Supervision for safety coming to sit EOB with HOB elevated.  Transfers Overall transfer level: Needs assistance Equipment used: Rolling walker (2 wheeled) Transfers: Sit to/from Stand Sit to Stand: Min assist         General transfer comment: Practiced shifting weight anteriorly onto feet sitting EOB prior to transfer to inc tolerance to weight bearing to inc safety. MinA to come to stand and steady pt due to minor LOB due to pain with weight bearing on L foot. Cues for hand placement  Ambulation/Gait Ambulation/Gait assistance: Min guard;Min assist Gait Distance (Feet): 200 Feet Assistive device: Rolling walker (2 wheeled) Gait Pattern/deviations: Step-through pattern;Decreased stride length;Decreased step length - right;Decreased stance time - left;Decreased weight shift to left;Trunk flexed Gait velocity: decr Gait velocity interpretation: <1.8 ft/sec, indicate of risk for recurrent falls General Gait Details: Ambulates initially with decreased L weight shift and stance time and thus decreased R step length, but improved to be more symmetrical bilaterally as pt progressed with distance and when cued. Pt cued to place weight on lateral aspect and heel of L foot to avoid pressure and pain in L toe. MinA-min guard assist for safety.   Stairs             Wheelchair Mobility    Modified Rankin (Stroke Patients Only) Modified Rankin (Stroke Patients  Only) Pre-Morbid Rankin Score: Slight disability Modified Rankin: Moderately severe disability     Balance Overall balance assessment: Needs assistance Sitting-balance support: No upper extremity supported;Feet supported Sitting balance-Leahy Scale: Good Sitting balance - Comments: Able to donn R  sock in stting with min guard for safety.   Standing balance support: Bilateral upper extremity supported;During functional activity Standing balance-Leahy Scale: Poor Standing balance comment: reliant on UE support due to L foot pain.                            Cognition Arousal/Alertness: Awake/alert Behavior During Therapy: WFL for tasks assessed/performed Overall Cognitive Status: Impaired/Different from baseline Area of Impairment: Safety/judgement                         Safety/Judgement: Decreased awareness of deficits;Decreased awareness of safety     General Comments: Difficulty with recalling precautions quickly, required repeated cues to avoid placing weight through L UE throughout.      Exercises      General Comments General comments (skin integrity, edema, etc.): Noted warmth, swelling, and redness at L MP joint; pain with palpation, PROM, and AROM at MP joint but not on rest of foot/ankle      Pertinent Vitals/Pain Pain Assessment: Faces Faces Pain Scale: Hurts whole lot Pain Location: L foot Pain Descriptors / Indicators: Sore;Sharp;Shooting Pain Intervention(s): Limited activity within patient's tolerance;Monitored during session;Repositioned    Home Living                      Prior Function            PT Goals (current goals can now be found in the care plan section) Acute Rehab PT Goals Patient Stated Goal: stop having foot pain PT Goal Formulation: With patient Time For Goal Achievement: 06/07/20 Potential to Achieve Goals: Good Progress towards PT goals: Progressing toward goals    Frequency    Min 3X/week      PT Plan Current plan remains appropriate    Co-evaluation              AM-PAC PT "6 Clicks" Mobility   Outcome Measure  Help needed turning from your back to your side while in a flat bed without using bedrails?: A Little Help needed moving from lying on your back to sitting on the side of  a flat bed without using bedrails?: A Little Help needed moving to and from a bed to a chair (including a wheelchair)?: A Little Help needed standing up from a chair using your arms (e.g., wheelchair or bedside chair)?: A Little Help needed to walk in hospital room?: A Little Help needed climbing 3-5 steps with a railing? : Total 6 Click Score: 16    End of Session Equipment Utilized During Treatment: Gait belt Activity Tolerance: Patient tolerated treatment well Patient left: Other (comment) (with OT in restroom) Nurse Communication: Mobility status;Other (comment) (signs/symptoms of L foot this date) PT Visit Diagnosis: Other abnormalities of gait and mobility (R26.89);Other symptoms and signs involving the nervous system (R29.898);Unsteadiness on feet (R26.81);Difficulty in walking, not elsewhere classified (R26.2)     Time: 1440-1500 PT Time Calculation (min) (ACUTE ONLY): 20 min  Charges:  $Gait Training: 8-22 mins                     Moishe Spice, PT, DPT Acute Rehabilitation Services  Pager: [phone] Office:  [phone]    Dustin Hamilton 05/30/2020, 3:22 PM

## 2020-05-30 NOTE — Progress Notes (Signed)
In to give patient scheduled med.  He would not turn to look at me nor give me his name but did have eyes open.  Wife stated that this "episode" was what brought him into the hospital. Within one to two minutes, the patient was alert and oriented with no complaints, other than pain to his left foot when touched.  Dr. Clayborne Artist notified and at bedside.  Ice pack to both left foot and area of pace maker which is swollen.  VSS 120/77; 69; 97.4; 98% RA.

## 2020-05-30 NOTE — Progress Notes (Signed)
Spoke with radiology who stated MR of left foot w/o contrast will not be completed today due to patient having conditional pacemaker and RN's leave at 5 p.m.

## 2020-05-30 NOTE — Progress Notes (Signed)
Family Medicine Teaching Service Daily Progress Note Intern Pager: [phone]  Patient name: Dustin Hamilton Medical record number: [id number] Date of birth: 1941-05-20 Age: 79 y.o. Gender: male  Primary Care Provider: [facility] Consultants:  Neurology, Cardiology  Code Status: Full  Pt Overview and Major Events to Date:  12/21 Admitted 12/22 EEG completed 12/23-26 Continuous EEG 12/27 - Neurology signed-off 12/28 - Cleared from cardiology perspective  Assessment and Plan: Dustin Pare Edgertonis a 79 y.o.malepresenting by EMS s/pseizure-likeepsiode.Of note, patient recently hospitalized 05/03/2020 with same presentation; workup at that time was inconclusive.PMH is significant forCVAin 2014, HTN, pSVT with AVNRT ablation in 11/2018, Mobitz Type 1 AV block, dizziness.  Acute left foot pain This morning reports no change in symptoms, no known injury to the area. X-ray showed OA and calcaneal spurs. Sprain vs gout (physical exam findings less likely for gout as the area has minimal swelling and it not erythematous or warm). Consider further imaging and work-up should symptoms acutely worsen. - Continue PT/OT as able, recommending CIR currently  - Tylenol PRN for pain - Voltaren gel PRN  Syncope  Spells Likely multifactorial possibly related to amyloid per neurology. 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 - continuous cardiac monitoring - cardiology following, appreciate recs   H/o reported atrial flutter Possibly artifact upon review, Eliquis held per cardiology, will be resumed if atrial flutter noted on device. - Cleared from cardiology perspective  H/o SVT s/p slow pathway ablation 11/2018 - Continue metoprolol  12.5 mg daily per cardiology  HTN: chronic, stable BP 133/85. - Continue Amlodipine 5 mg  - Continue to monitor  Hx Polycythemia Hx of Thrombocytopenia Last labs drawn 12/28 with Hgb 15.1 and plt 145. Consider serum EPO vs outpatient work up - Monitor with CBC   BPH: chronic, stable - Continue Dutasteride 0.5 mgevery morning - Tamsulosin 0.4 mg  History of CVA in 2014 Followed by Neurologist, Dr. [name].Holding eliquis (see above) - PT/OT eval and treat - Continue atorvastatin 40mg  daily  Glaucoma: chronic, stable Vision difficulties in left eye. - Continue Brimonidine 0.2% ophthalmic solution 1 drop both eyes BID  - Continue Latanoprost 0.005% ophthalmic solution, 1 drop both eyes QHS  Mild dementia: chronic, stable Follows with Neurology, as above. - Continue Namendanightly   FEN/GI: heart healthy diet  PPx: lovenox    Status is: Inpatient   Dispo: The patient is from: Home              Anticipated d/c is to: CIR                             Subjective:  Patient reports that he is doing well today.  His only complaint is his left foot pain, which he says started suddenly without any known injury.  He also states that his right ankle pain has resolved.  Objective: Temp:  [97.8 F (36.6 C)-99.7 F (37.6 C)] 98.2 F (36.8 C) (12/29 0810) Pulse Rate:  [66-95] 67 (12/29 0810) Resp:  [18] 18 (12/29 0810) BP: (111-133)/(78-95) 133/85 (12/29 0810) SpO2:  [94 %-98 %] 98 % (12/29 0342) Physical Exam: General: NAD, supine in bed, pleasant and conversational Cardiovascular: RRR, no murmurs or gallops auscultated  Respiratory: CTA B, normal WOB Abdomen: Soft, nontender, nondistended,  active bowel sounds Extremities:     - Right foot: Second metatarsal pain with palpation, pain with active toe and foot extension, pain with passive foot flexion, pain with passive extension of right great toe. Mild swelling compared to the left foot.    - Left foot:  normal ROM, no pain with passive or active motion  Laboratory: Recent Labs  Lab 05/27/20 0222 05/28/20 1116 05/29/20 0426  WBC 6.6 5.6 7.0  HGB 14.5 15.8 15.1  HCT 43.1 45.1 44.3  PLT 146* 145* 145*   Recent Labs  Lab 05/27/20 0222 05/28/20 1116 05/29/20 0426  NA 138 140 136  K 3.6 4.0 3.8  CL 107 108 105  CO2 23 26 23   BUN 16 15 17   CREATININE 1.01 1.01 1.00  CALCIUM 9.1 9.1 9.3  GLUCOSE 103* 91 112*      Imaging/Diagnostic Tests: DG Foot Complete Left  Result Date: 05/29/2020 CLINICAL DATA:  Dorsal foot pain EXAM: LEFT FOOT - COMPLETE 3+ VIEW COMPARISON:  None. 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 No sign of fracture or other focal finding. Small calcaneal spurs, commonly seen. 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 Small calcaneal spurs. Electronically Signed   By: M.D.   On: 05/29/2020 14:09    Paulina Fusi, DO 05/30/2020, 9:19 AM PGY-1, [facility] Family Medicine FPTS Intern pager: [phone], text pages welcome

## 2020-05-31 DIAGNOSIS — M79672 Pain in left foot: Secondary | ICD-10-CM | POA: Diagnosis not present

## 2020-05-31 DIAGNOSIS — I442 Atrioventricular block, complete: Secondary | ICD-10-CM | POA: Diagnosis not present

## 2020-05-31 DIAGNOSIS — I680 Cerebral amyloid angiopathy: Secondary | ICD-10-CM | POA: Diagnosis not present

## 2020-05-31 DIAGNOSIS — R55 Syncope and collapse: Secondary | ICD-10-CM | POA: Diagnosis not present

## 2020-05-31 MED ORDER — ASPIRIN 81 MG PO TBEC: 81.0000 mg | DELAYED_RELEASE_TABLET | Freq: Every day | ORAL | 11 refills | Status: DC

## 2020-05-31 MED ORDER — ATORVASTATIN CALCIUM 40 MG PO TABS: 40.0000 mg | ORAL_TABLET | Freq: Every day | ORAL | 1 refills | Status: DC

## 2020-05-31 MED ORDER — METOPROLOL SUCCINATE ER 25 MG PO TB24: 12.5000 mg | ORAL_TABLET | Freq: Every day | ORAL | 1 refills | Status: DC

## 2020-05-31 MED ORDER — COLCHICINE 0.6 MG PO TABS: 0.6000 mg | ORAL_TABLET | Freq: Every day | ORAL | 0 refills | Status: DC

## 2020-05-31 NOTE — Discharge Summary (Addendum)
Family Medicine [facility] Discharge Summary  Patient name: Dustin Hamilton Medical record number: [id number] Date of birth: 1940-12-19 Age: 79 y.o. 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  Denies pain with passive and active great toe ROM    -Right foot: normal ROM, no pain with passive or active motion  Brief Hospital Course:  Dustin Hamilton is a 79 y.o. male who presented s/p seizure-like epsiode. PMH is significant for CVA, HTN, pSVT with AVNRT ablation in 11/2018, Mobitz Type 1 AV block, dizziness.   Unresponsive episodes of unknown etiology Patient admitted status post seizure-like episode.  Neuro exam benign. Neurology consulted in the ED.  Patient started on continuous EEG which revealed no evidence for seizure activity even though he had several "spells" during the continuous EEG.  Patient was on continuous EEG from 12/23-26.  Neurology concluded no need for antiepileptic drugs for this patient.  Also recommended driving restriction for 6 months given episodic alterations of  awareness.  Neurology states that if episodes resolve after the pacemaker implant, will not need further work-up; however if episodes persist consider referral to ENT for evaluation of BPPV.  If ENT work-up is negative can consider empiric trial of EPA 250 mg twice daily for possible amyloid spells.  Hx paroxysmal SVT  H/o First degree AV Block s/p pacemaker placement 12/27 Cardiology consulted, patient on continuous telemetry monitoring for most of admission.  While cardiology wonders if these "spells" are from vasovagal syncope/orthostatic intolerance, also concern for observed intermittent complete AV block on telemetry.  Permanent pacemaker placed via cardiology on 05/28/2020.  See neuro notes above for other non-cardiac workup.  On day of discharge, patient reported feeling well with no complaints. Cardiology recommended discontinuation of eliquis as he was likely misdiagnosed with a fib previously (likely artifact).   Gout of left great toe Patient had sudden onset of extreme pain in left foot that was a barrier to patient walking. Xray imaging was obtained with no signs of fracture of trauma noted. MRI unable to be obtained due to it being too close to pacemaker insertion (must wait 6 weeks). Patient was treated with a trial of colchicine with improvement and discharged on colchicine daily for 2 more days.  Right-Ankle Pain Patient complaining of 3 days of ankle pain prior to admission. Without trauma or injury to the area. Ankle x-ray in ED negative for fracture. Evaluated and treated by PT/OT. Patient reports ankle actually feels better after movement and PT.  Unsure etiology of right ankle pain; improvement after use incongruent with ligamentous injury.  Recommend stretching and gentle usage as tolerated.  Labile blood pressures   history of hypertension Patient put on home amlodipine 5 mg on admission due to hypertension with systolics in the 200s.  However, several  days later was held due  to soft blood pressures.  Amlodipine restarted 12/24.  At time of discharge, blood pressures last 24 hours had been 87-120/65-81, with last measurement 102/65.   Other problems chronic and stable.   Items for follow-up 1. Driving restriction for 6 months per [place] law.  2. Consider further work up for polycythemia  3. Consider d/c either dutasteride or tamsulosin due to +orthostatics  4. Tamsulosin was restarted 12/27; it was held on admission out of concern for orthostatics. However, patient has several unresponsive "spells" even while off tamsulosin for about a week. Likely non-contributory to episodes.  5. Recheck BP. BP medications held initially during admission due to lower blood pressures. 6. Consider aricept and/or namenda as possible etiology of syncopal episodes and intermittent heart block. Consider discontinuation of both. This should be a shared decision making conversation with PCP, family, and whomever started the namenda. 7. Was started on Atorvastatin 40mg   8. Per cards, perform CTA head/neck as outpatient to evaluate for possible etiology of the near-syncopal/unresponsive episode 9. Eliquis discontinued patient's previous a fib dx wrong  10. Neurology states that if episodes resolve after the pacemaker implant, will not need further work-up; however if episodes persist consider referral to ENT for evaluation of BPPV.  If ENT work-up is negative can consider empiric trial of EPA 250 mg twice daily for possible amyloid spells. 11. Avoid NSAIDs 12. Consider adding allopurinol as gout prophylaxis; patient treated with colchicine and to continue for 3 days total.   Significant Procedures: Biotronik dual chamber PPM on 12/27  Significant Labs and Imaging:  Recent Labs  Lab 05/27/20 0222 05/28/20 1116 05/29/20 0426  WBC 6.6 5.6 7.0  HGB 14.5 15.8 15.1  HCT 43.1 45.1 44.3  PLT 146* 145* 145*   Recent Labs  Lab 05/25/20 0243 05/26/20 0323 05/27/20 0222 05/28/20 1116  05/29/20 0426  NA 139 137 138 140 136  K 3.8 3.6 3.6 4.0 3.8  CL 107 108 107 108 105  CO2 25 21* 23 26 23   GLUCOSE 115* 96 103* 91 112*  BUN 19 12 16 15 17   CREATININE 1.25* 0.87 1.01 1.01 1.00  CALCIUM 8.5* 8.8* 9.1 9.1 9.3    DG Chest 1 View  Result Date: 05/22/2020 CLINICAL DATA:  Syncope EXAM: CHEST  1 VIEW COMPARISON:  May 03, 2020 FINDINGS: The heart size and mediastinal contours are within normal limits. Both lungs are clear. The visualized skeletal structures are unremarkable. There is stable elevation of the right hemidiaphragm. IMPRESSION: No active disease. Electronically Signed   By: M.D.   On: 05/22/2020 18:08   DG Chest 2 View  Result Date: 05/29/2020 CLINICAL DATA:  Pacemaker insertion. EXAM: CHEST - 2 VIEW COMPARISON:  Chest x-ray 05/22/2020. FINDINGS: Process superior mediastinum consistent known goiter. Cardiac pacer with lead tips over the right atrium right ventricle. Heart size normal. Low lung volumes with mild bibasilar atelectasis. No pleural effusion or pneumothorax. IMPRESSION: 1. Cardiac pacer with lead tips over right atrium and right ventricle. Heart size normal. 2. Low lung volumes with mild bibasilar atelectasis. Electronically Signed   By: 05/24/2020  Register   On: 05/29/2020 06:32   DG Ankle 2 Views Right  Result Date: 05/22/2020 CLINICAL DATA:  Right ankle pain. EXAM: RIGHT ANKLE - 2 VIEW COMPARISON:  None. FINDINGS: No evidence of fracture, dislocation, or joint effusion. Degenerative change in the navicular. Posterior calcaneal. No aggressive appearing focal bone abnormality. Soft tissues are unremarkable. IMPRESSION: No acute displaced  fracture or dislocation of the right ankle on this two view radiograph. Electronically Signed   By: Iven Finn M.D.   On: 05/22/2020 18:38   MR BRAIN WO CONTRAST  Result Date: 05/03/2020 CLINICAL DATA:  Recurrent syncope EXAM: MRI HEAD WITHOUT CONTRAST TECHNIQUE: Multiplanar, multiecho pulse  sequences of the brain and surrounding structures were obtained without intravenous contrast. COMPARISON:  MRI head 09/24/2018 FINDINGS: Brain: Negative for acute infarct. Moderate chronic microvascular ischemic change in the white matter bilaterally. Mild chronic ischemic change in the pons. Numerous foci of punctate microhemorrhage throughout both cerebral hemispheres and in the cerebellum bilaterally. No change from the prior study. No large area of hemorrhage. No mass or edema identified. Vascular: Normal arterial flow voids. Skull and upper cervical spine: No focal skeletal abnormality Sinuses/Orbits: Mild mucosal edema paranasal sinuses. Left cataract extraction Other: None IMPRESSION: Negative for acute infarct Atrophy and moderate chronic ischemic change. Numerous foci of chronic microhemorrhage throughout the cerebellum and cerebrum bilaterally. This is unchanged from the prior study. Differential includes uncontrolled hypertension and cerebral amyloid. Electronically Signed   By: Franchot Gallo M.D.   On: 05/03/2020 21:56   EP PPM/ICD IMPLANT  Result Date: 05/28/2020 CONCLUSIONS:  1. Successful implantation of a Biotronik dual-chamber pacemaker for symptomatic bradycardia due to intermittent CHB  2. No early apparent complications.       Cristopher Peru, MD 05/28/2020 4:06 PM   DG Chest Portable 1 View  Result Date: 05/03/2020 CLINICAL DATA:  Syncope EXAM: PORTABLE CHEST 1 VIEW COMPARISON:  12/04/2019 FINDINGS: Persistent elevation of the right hemidiaphragm. No new consolidation or edema. No significant pleural effusion. No pneumothorax. Stable cardiomediastinal contours with normal heart size. IMPRESSION: No acute process in the chest. Electronically Signed   By: Macy Mis M.D.   On: 05/03/2020 14:28   DG Foot Complete Left  Result Date: 05/30/2020 CLINICAL DATA:  Left foot pain for 2 days, dorsal pain EXAM: LEFT FOOT - COMPLETE 3+ VIEW COMPARISON:  05/29/2020 FINDINGS: Frontal, oblique,  and lateral views of the left foot are obtained. No fracture, subluxation, or dislocation. No change in the hindfoot and midfoot osteoarthritis reported yesterday. No change in the enthesopathic changes of the calcaneus. Soft tissues are unremarkable. IMPRESSION: 1. Stable osteoarthritis.  No acute fracture. Electronically Signed   By: Randa Ngo M.D.   On: 05/30/2020 17:30   DG Foot Complete Left  Result Date: 05/29/2020 CLINICAL DATA:  Dorsal foot pain EXAM: LEFT FOOT - COMPLETE 3+ VIEW COMPARISON:  None. 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 No sign of fracture or other focal finding. Small calcaneal spurs, commonly seen. 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 Small calcaneal spurs. Electronically Signed   By: Nelson Chimes M.D.   On: 05/29/2020 14:09   EEG adult  Result Date: 05/03/2020 Lora Havens, MD     05/03/2020  7:19 PM Patient Name: PRANEETH HILBURN MRN: [id number] Epilepsy Attending: Lora Havens Referring Physician/Provider: Clance Boll, NP Date: 05/03/2020 Duration: 23.12 mins Patient history: 79 yo male with a PMHx of CVA 2014, recurrent syncope, AVB, SVT s/p ablation with "staring spells" and syncope. EEG to evaluate for seizure Level of alertness: Awake AEDs during EEG study: None Technical aspects: This EEG study was done with scalp electrodes positioned according to the 10-20 International system of electrode placement. Electrical activity was acquired at a sampling rate of 500Hz  and reviewed with a high frequency filter of 70Hz  and a low frequency filter of  1Hz . EEG data were recorded continuously and digitally stored. Description: The posterior dominant rhythm consists of 9 Hz activity of moderate voltage (25-35 uV) seen predominantly in posterior head regions, symmetric and reactive to eye opening and eye closing. EEG showed intermittent 2-3hz  delta slowing in right temporal region.  Hyperventilation and photic  stimulation were not performed.  ABNORMALITY -Intermittent slow, right temporal region IMPRESSION: This study is suggestive of non-specific cortical dysfuntion in right temporal region. No seizures or epileptiform discharges were seen throughout the recording. Priyanka Barbra Sarks   Overnight EEG with video  Result Date: 05/23/2020 Lora Havens, MD     05/24/2020  9:41 AM Patient Name: JENNA STEIGHNER MRN: [id number] Epilepsy Attending: Lora Havens Referring Physician/Provider: Dr Donnetta Simpers Duration: 05/22/2020 2146 to 05/23/2020 2146  Patient history: 79 y.o.malewith PMH significant for prior stroke in 2014, hx of SVT s/p ablation 6/20, hx of AV block, mild dementia whopresents today for evaluation of multiple spells that are clinically difficult to determine if they are partial complex seizures.The semiology of the event is as follows "leans forward -> expressive aphasia with intact awareness -> feels warm -> no post ictal confusion". He has about 2 events a day. EEG to evaluate for seizure  Level of alertness: Awake, asleep  AEDs during EEG study: None  Technical aspects: This EEG study was done with scalp electrodes positioned according to the 10-20 International system of electrode placement. Electrical activity was acquired at a sampling rate of 500Hz  and reviewed with a high frequency filter of 70Hz  and a low frequency filter of 1Hz . EEG data were recorded continuously and digitally stored.  Description: The posterior dominant rhythm consists of 9-10 Hz activity of moderate voltage (25-35 uV) seen predominantly in posterior head regions, symmetric and reactive to eye opening and eye closing.  Sleep was characterized by vertex waves, sleep spindles (12-14hz ), maximal  frontoHyperventilation and photic stimulation were not performed.    IMPRESSION: This study is within normal limits. No seizures or epileptiform discharges were seen throughout the recording.  Lora Havens    Portable EEG  Result Date: 05/23/2020 Lora Havens, MD     05/23/2020  9:27 AM Patient Name: JEFFEY WEIDERT MRN: [id number] Epilepsy Attending: Lora Havens Referring Physician/Provider: Dr Silvio Pate Date: 05/22/2020 Duration: 21.50 mins Patient history: 79 y.o. 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 The semiology of the event is as follows "leans forward -> expressive aphasia with intact awareness -> feels warm -> no post ictal confusion". He has about 2 events a day. EEG to evaluate for seizure Level of alertness: Awake AEDs during EEG study: None Technical aspects: This EEG study was done with scalp electrodes positioned according to the 10-20 International system of electrode placement. Electrical activity was acquired at a sampling rate of 500Hz  and reviewed with a high frequency filter of 70Hz  and a low frequency filter of 1Hz . EEG data were recorded continuously and digitally stored. Description: The posterior dominant rhythm consists of 9-10 Hz activity of moderate voltage (25-35 uV) seen predominantly in posterior head regions, symmetric and reactive to eye opening and eye closing.  Hyperventilation and photic stimulation were not performed.   IMPRESSION: This study is within normal limits. No seizures or epileptiform discharges were seen throughout the recording. Priyanka  Barbra Sarks   ECHOCARDIOGRAM COMPLETE  Result Date: 05/23/2020    ECHOCARDIOGRAM REPORT   Patient Name:   NIKKOLAI HARTL Date of Exam: 05/23/2020 Medical Rec #:  [id number]          Height:       71.0 in Accession #:    [id number]         Weight:       170.0 lb Date of Birth:  [birth year]          BSA:          1.968 m Patient Age:    11 years           BP:           141/94 mmHg Patient Gender: M                   HR:           77 bpm. Exam Location:  Inpatient Procedure: 2D Echo, Cardiac Doppler, Color Doppler and Intracardiac            Opacification Agent Indications:    R55 Syncope  History:        Patient has prior history of Echocardiogram examinations, most                 recent 05/04/2020. Abnormal ECG, COPD and Stroke,                 Signs/Symptoms:Syncope, Chest Pain and                 Dizziness/Lightheadedness; Risk Factors:Hypertension.  Sonographer:    Roseanna Rainbow RDCS Referring Phys: [id number] Elouise Munroe  Sonographer Comments: Technically difficult study due to poor echo windows, suboptimal parasternal window, suboptimal apical window and suboptimal subcostal window. Image acquisition challenging due to respiratory motion. Extremely difficult study. Attemptd to document windows and locations and tried to turn patient. Definity attempted for the second time. IMPRESSIONS  1. Left ventricular ejection fraction, by estimation, is 60 to 65%. The left ventricle has normal function. The left ventricle has no regional wall motion abnormalities. There is mild asymmetric left ventricular hypertrophy of the basal-septal segment. Left ventricular diastolic parameters are consistent with Grade I diastolic dysfunction (impaired relaxation).  2. Right ventricular systolic function is normal. The right ventricular size is mildly enlarged.  3. A small pericardial effusion is present. The pericardial effusion is circumferential. There is no evidence of cardiac tamponade.  4. The mitral valve is grossly normal. No evidence of mitral valve regurgitation. No evidence of mitral stenosis.  5. The aortic valve is tricuspid. There is mild calcification of the aortic valve. Aortic valve regurgitation is not visualized. Mild aortic valve sclerosis is present, with no evidence of aortic valve stenosis.  6. The inferior vena cava is normal in size with greater than 50% respiratory variability, suggesting right atrial pressure of 3  mmHg. Comparison(s): No significant change from prior study. FINDINGS  Left Ventricle: Left ventricular ejection fraction, by estimation, is 60 to 65%. The left ventricle has normal function. The left ventricle has no regional wall motion abnormalities. Definity contrast agent was given IV to delineate the left ventricular  endocardial borders. The left ventricular internal cavity size was normal in size. There is mild asymmetric left ventricular hypertrophy of the basal-septal segment. Left ventricular diastolic parameters are consistent with Grade I diastolic dysfunction  (impaired relaxation). Right Ventricle: The right ventricular size is mildly enlarged. No increase in right ventricular  wall thickness. Right ventricular systolic function is normal. Left Atrium: Left atrial size was normal in size. Right Atrium: Right atrial size was normal in size. Pericardium: A small pericardial effusion is present. The pericardial effusion is circumferential. There is no evidence of cardiac tamponade. Presence of pericardial fat pad. Mitral Valve: The mitral valve is grossly normal. No evidence of mitral valve regurgitation. No evidence of mitral valve stenosis. Tricuspid Valve: The tricuspid valve is grossly normal. Tricuspid valve regurgitation is not demonstrated. No evidence of tricuspid stenosis. Aortic Valve: The aortic valve is tricuspid. There is mild calcification of the aortic valve. Aortic valve regurgitation is not visualized. Mild aortic valve sclerosis is present, with no evidence of aortic valve stenosis. Pulmonic Valve: The pulmonic valve was grossly normal. Pulmonic valve regurgitation is not visualized. No evidence of pulmonic stenosis. Aorta: The aortic root and ascending aorta are structurally normal, with no evidence of dilitation. Venous: The inferior vena cava is normal in size with greater than 50% respiratory variability, suggesting right atrial pressure of 3 mmHg. IAS/Shunts: The atrial septum is  grossly normal.  LEFT VENTRICLE PLAX 2D LVIDd:         3.80 cm     Diastology LVIDs:         2.70 cm     LV e' medial:    7.18 cm/s LV PW:         0.94 cm     LV E/e' medial:  6.4 LV IVS:        1.27 cm     LV e' lateral:   10.90 cm/s                            LV E/e' lateral: 4.2  LV Volumes (MOD) LV vol d, MOD A2C: 43.9 ml LV vol d, MOD A4C: 43.4 ml LV vol s, MOD A2C: 20.2 ml LV vol s, MOD A4C: 19.6 ml LV SV MOD A2C:     23.7 ml LV SV MOD A4C:     43.4 ml LV SV MOD BP:      24.2 ml RIGHT VENTRICLE         IVC TAPSE (M-mode): 1.4 cm  IVC diam: 1.20 cm LEFT ATRIUM             Index      RIGHT ATRIUM           Index LA diam:        2.90 cm 1.47 cm/m RA Area:     10.77 cm LA Vol (A2C):   10.8 ml 5.49 ml/m RA Volume:   21.65 ml  11.00 ml/m LA Vol (A4C):   16.8 ml 8.54 ml/m LA Biplane Vol: 14.6 ml 7.42 ml/m  AORTIC VALVE LVOT Vmax:   82.10 cm/s LVOT Vmean:  50.300 cm/s LVOT VTI:    0.155 m  AORTA Ao Root diam: 3.30 cm Ao Asc diam:  3.30 cm MITRAL VALVE MV Area (PHT): 5.13 cm     SHUNTS MV Decel Time: 148 msec     Systemic VTI: 0.16 m MV E velocity: 45.90 cm/s MV A velocity: 106.00 cm/s MV E/A ratio:  0.43 Eleonore Chiquito MD Electronically signed by Eleonore Chiquito MD Signature Date/Time: 05/23/2020/12:03:59 PM    Final    ECHOCARDIOGRAM COMPLETE  Result Date: 05/04/2020    ECHOCARDIOGRAM REPORT   Patient Name:   NIRAJ ORTLIP Date of Exam: 05/04/2020 Medical Rec #:  [id number]  Height:       71.0 in Accession #:    [id number]         Weight:       164.5 lb Date of Birth:  June 30, 1940          BSA:          1.940 m Patient Age:    89 years           BP:           141/97 mmHg Patient Gender: M                  HR:           80 bpm. Exam Location:  Inpatient Procedure: 2D Echo, Cardiac Doppler, Color Doppler and Intracardiac            Opacification Agent Indications:    Syncope  History:        Patient has prior history of Echocardiogram examinations, most                 recent 06/09/2017. Stroke and  COPD, Arrythmias:PSVT, AV block;                 Risk Factors:Hypertension.  Sonographer:    Dustin Flock Referring Phys: [id number] [facility]  Sonographer Comments: Technically difficult study due to poor echo windows, no parasternal window and suboptimal apical window. Image acquisition challenging due to respiratory motion. IMPRESSIONS  1. The LV images are technically difficult , even with Definity contrast . . Left ventricular ejection fraction, by estimation, is 55 to 60%. The left ventricle has normal function. The left ventricle has no regional wall motion abnormalities. Left ventricular diastolic parameters are consistent with Grade I diastolic dysfunction (impaired relaxation).  2. Right ventricular systolic function is normal. The right ventricular size is moderately enlarged.  3. A small pericardial effusion is present. There is no evidence of cardiac tamponade.  4. The mitral valve is grossly normal. Trivial mitral valve regurgitation.  5. The aortic valve was not well visualized. Aortic valve regurgitation is not visualized. No aortic stenosis is present. FINDINGS  Left Ventricle: The LV images are technically difficult , even with Definity contrast. Left ventricular ejection fraction, by estimation, is 55 to 60%. The left ventricle has normal function. The left ventricle has no regional wall motion abnormalities.  Definity contrast agent was given IV to delineate the left ventricular endocardial borders. The left ventricular internal cavity size was normal in size. There is no left ventricular hypertrophy. Left ventricular diastolic parameters are consistent with  Grade I diastolic dysfunction (impaired relaxation). Right Ventricle: The right ventricular size is moderately enlarged. No increase in right ventricular wall thickness. Right ventricular systolic function is normal. Left Atrium: Left atrial size was normal in size. Right Atrium: Right atrial size was normal in size. Pericardium: A  small pericardial effusion is present. There is no evidence of cardiac tamponade. Mitral Valve: The mitral valve is grossly normal. Trivial mitral valve regurgitation. Tricuspid Valve: The tricuspid valve is not well visualized. Tricuspid valve regurgitation is not demonstrated. Aortic Valve: The aortic valve was not well visualized. Aortic valve regurgitation is not visualized. No aortic stenosis is present. Pulmonic Valve: The pulmonic valve was not well visualized. Pulmonic valve regurgitation is not visualized. Aorta: Aortic root could not be assessed. IAS/Shunts: The atrial septum is grossly normal.   Diastology LV e' medial:    4.57 cm/s LV E/e' medial:  7.3 LV e' lateral:  6.31 cm/s LV E/e' lateral: 5.3  RIGHT VENTRICLE RV Basal diam:  3.10 cm RV S prime:     11.70 cm/s TAPSE (M-mode): 3.2 cm LEFT ATRIUM           Index       RIGHT ATRIUM           Index LA Vol (A4C): 50.4 ml 25.97 ml/m RA Area:     15.70 cm                                   RA Volume:   39.40 ml  20.31 ml/m  AORTIC VALVE LVOT Vmax:   82.10 cm/s LVOT Vmean:  62.300 cm/s LVOT VTI:    0.155 m MITRAL VALVE MV Area (PHT): 7.99 cm    SHUNTS MV Decel Time: 95 msec     Systemic VTI: 0.16 m MV E velocity: 33.40 cm/s MV A velocity: 63.40 cm/s MV E/A ratio:  0.53 Mertie Moores MD Electronically signed by Mertie Moores MD Signature Date/Time: 05/04/2020/11:21:21 AM    Final    LONG TERM MONITOR (3-14 DAYS)  Result Date: 05/20/2020 1. SR/SB/ST 2. Occasional PACs and PVCs 3. Short runs of SVT 4. CHB with up to 8 second pause. 5. Needs referral to EP on Monday    Results/Tests Pending at Time of Discharge: None  Discharge Medications:  Allergies as of 05/31/2020      Reactions   Penicillins Hives      Medication List    STOP taking these medications   apixaban 5 MG Tabs tablet Commonly known as: ELIQUIS   levETIRAcetam 500 MG 24 hr tablet Commonly known as: KEPPRA XR   meclizine 25 MG tablet Commonly known as: ANTIVERT      TAKE these medications   amLODipine 5 MG tablet Commonly known as: NORVASC Take 5 mg by mouth daily.   aspirin 81 MG EC tablet Take 1 tablet (81 mg total) by mouth daily. Swallow whole. Start taking on: June 01, 2020   atorvastatin 40 MG tablet Commonly known as: LIPITOR Take 1 tablet (40 mg total) by mouth daily. Start taking on: June 01, 2020   brimonidine 0.2 % ophthalmic solution Commonly known as: ALPHAGAN Place 1 drop into both eyes 2 (two) times daily.   colchicine 0.6 MG tablet Take 1 tablet (0.6 mg total) by mouth daily. Start taking on: June 01, 2020   dutasteride 0.5 MG capsule Commonly known as: AVODART Take 0.5 mg by mouth daily.   latanoprost 0.005 % ophthalmic solution Commonly known as: XALATAN Place 1 drop into both eyes at bedtime.   memantine 5 MG tablet Commonly known as: NAMENDA Take 17.5 mg by mouth at bedtime.   metoprolol succinate 25 MG 24 hr tablet Commonly known as: TOPROL-XL Take 0.5 tablets (12.5 mg total) by mouth at bedtime.   tamsulosin 0.4 MG Caps capsule Commonly known as: FLOMAX Take 0.4 mg by mouth at bedtime.            Discharge Care Instructions  (From admission, onward)         Start     Ordered   05/31/20 0000  Discharge wound care:       Comments: Per cardiology recommendations, listed in patient instructions.   05/31/20 1511          Discharge Instructions: Please refer to Patient Instructions section of EMR for full details.  Patient  was counseled important signs and symptoms that should prompt return to medical care, changes in medications, dietary instructions, activity restrictions, and follow up appointments.   Follow-Up Appointments:  Follow-up Information    Evans Lance, MD Follow up on 09/04/2020.   Specialty: Cardiology Why: at 3 pm for 3 month pacemaker check Contact information: [address] [place] [address] [phone]        [facility] Follow up on 06/07/2020.   Why: at 1200 for post pacemaker wound check Contact information: [place] [phone] [facility] [place] Follow up.   Contact information: [place] [address] [phone]        Lorretta Harp, MD .   Specialties: Cardiology, Radiology Contact information: [address] [place] [address] [phone]               Rise Patience, [place] 05/31/2020, 3:21 PM PGY-1, [place]

## 2020-05-31 NOTE — Progress Notes (Signed)
Family Medicine Teaching Service Daily Progress Note Intern Pager: [phone]  Patient name: Dustin Hamilton Medical record number: [id number] Date of birth: 1940/09/17 Age: 79 y.o. Gender: male  Primary Care Provider: [facility] Consultants:  Neurology, Cardiology  Code Status: Full  Pt Overview and Major Events to Date:  12/21 Admitted 12/22 EEG completed 12/23-26 Continuous EEG 12/27 - Neurology signed-off 12/28 - Cleared from cardiology perspective 12/10 -colchicine started  Assessment and Plan: Dustin Neighbors Edgertonis a 79 y.o.malepresenting by EMS s/pseizure-likeepsiode.Of note, patient recently hospitalized 05/03/2020 with same presentation; workup at that time was inconclusive.PMH is significant forCVAin 2014, HTN, pSVT with AVNRT ablation in 11/2018, Mobitz Type 1 AV block, dizziness.  Acute left foot pain, suspicious for gout Patient started on colchicine on 12/29.  Physical exam notable for some mild erythema and warmth to the lateral left great toe, no tenderness to palpation on any other aspect of the foot.  Patient reports improvement in symptoms with physical exam.  We will continue to monitor for improvement, given physical exam, uric acid of 7.1, and reported improvement with colchicine this is likely gout.  Will need to consider need for prophylactic therapy. - Continue PT/OT as able, recommending CIR currently  - Continue colchicine 0.6 mg daily - Tylenol PRN for pain - Voltaren gel PRN - Continue ice on left foot  Syncope  Spells, stable Patient had a spell yesterday without residual effects (see prior note).  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 - continuous cardiac monitoring - cardiology cleared for discharge   H/o reported atrial  flutter Resume eliquis if atrial flutter noted on device. 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 - Continue Amlodipine 5 mg  - Continue to monitor  Hx Polycythemia Hx of Thrombocytopenia Last labs drawn 12/28 with Hgb 15.1 and plt 145. Consider serum EPO vs outpatient work up - Monitor with CBC   BPH: chronic, stable - Continue Dutasteride 0.5 mgevery morning - Tamsulosin 0.4 mg  History of CVA in 2014 Followed by Neurologist, Dr. Marjory Lies.Holding eliquis (see above) - PT/OT eval and treat - Continue atorvastatin 40mg  daily and ASA  Glaucoma: chronic, stable Vision difficulties in left eye. - Continue Brimonidine 0.2% ophthalmic solution 1 drop both eyes BID  - Continue Latanoprost 0.005% ophthalmic solution, 1 drop both eyes QHS  Mild dementia: chronic, stable - Continue Namendanightly   FEN/GI: heart healthy diet  PPx: lovenox    Status is: Inpatient   Dispo: The patient is from: Home              Anticipated d/c is to: CIR                             Subjective:  Patient initially reported that his foot was not any better, but during physical exam he did note marked improvement from prior exams with pain.  Patient has no other concerns.  Objective: Temp:  [97.4 F (36.3 C)-97.8 F (36.6 C)] 97.8 F (36.6 C) (12/30 0746) Pulse Rate:  [60-81] 60 (12/30 0746) Resp:  [16-18] 18 (12/30 0746) BP: (92-120)/(65-81) 92/65 (12/30 0746) SpO2:  [96 %-99 %] 96 % (12/30 0746) Physical Exam: General: NAD, supine in  bed, pleasant and conversational Cardiovascular: RRR no M/R/G appreciated Respiratory: CTAB, normal WOB Abdomen: Soft, nontender, nondistended, active bowel sounds Extremities:     -Left foot: Mild erythema, warmth, and tenderness palpation of the lateral aspect of great toe.  Denies pain with passive and active  great toe ROM    -Right foot: normal ROM, no pain with passive or active motion  Laboratory: Recent Labs  Lab 05/27/20 0222 05/28/20 1116 05/29/20 0426  WBC 6.6 5.6 7.0  HGB 14.5 15.8 15.1  HCT 43.1 45.1 44.3  PLT 146* 145* 145*   Recent Labs  Lab 05/27/20 0222 05/28/20 1116 05/29/20 0426  NA 138 140 136  K 3.6 4.0 3.8  CL 107 108 105  CO2 23 26 23   BUN 16 15 17   CREATININE 1.01 1.01 1.00  CALCIUM 9.1 9.1 9.3  GLUCOSE 103* 91 112*      Imaging/Diagnostic Tests: DG Foot Complete Left  Result Date: 05/30/2020 CLINICAL DATA:  Left foot pain for 2 days, dorsal pain EXAM: LEFT FOOT - COMPLETE 3+ VIEW COMPARISON:  05/29/2020 FINDINGS: Frontal, oblique, and lateral views of the left foot are obtained. No fracture, subluxation, or dislocation. No change in the hindfoot and midfoot osteoarthritis reported yesterday. No change in the enthesopathic changes of the calcaneus. Soft tissues are unremarkable. IMPRESSION: 1. Stable osteoarthritis.  No acute fracture. Electronically Signed   By: Randa Ngo M.D.   On: 05/30/2020 17:30    Rise Patience, DO 05/31/2020, 8:48 AM PGY-1, [place] Intern pager: [phone], text pages welcome

## 2020-05-31 NOTE — Progress Notes (Signed)
Physical Therapy Treatment Patient Details Name: Dustin Hamilton MRN: [id number] DOB: Oct 18, 1940 Today's Date: 05/31/2020    History of Present Illness 79 y.o. male presenting by EMS s/p seizure-like epsiode. PMH is significant for CVA, HTN, pSVT with AVNRT ablation in 11/2018, Mobitz Type 1 AV block, dizziness. S/p pacemaker implantation 12/27.    PT Comments    Pt with improved L foot pain as he had no pain at all this date. It is suspected that he had an episode of gout that has now resolved. Due to pt's improved pain pt was able to tolerate full weight bearing on the L leg and ambulate an increased distance of ~400 ft with a RW with only min guard assist for safety. He did not display any LOB this date, but a mild trunk sway when initially coming to stand. Due to pt's significant progress changed recommendations to [facility] PT. Will continue to follow acutely.    Follow Up Recommendations  Supervision/Assistance - 24 hour;Home health PT     Equipment Recommendations  None recommended by PT    Recommendations for Other Services       Precautions / Restrictions Precautions Precautions: Fall Precaution Comments: pacemaker - no lifting, no raising arm above shoulder height, no pulling with LUE per PA Otilio Saber Required Braces or Orthoses: Sling Restrictions Weight Bearing Restrictions: No    Mobility  Bed Mobility Overal bed mobility: Needs Assistance Bed Mobility: Supine to Sit     Supine to sit: Supervision;HOB elevated     General bed mobility comments: Supervision for safety coming to sit EOB with HOB elevated.  Transfers Overall transfer level: Needs assistance Equipment used: Rolling walker (2 wheeled) Transfers: Sit to/from Stand Sit to Stand: Min guard         General transfer comment: Min guard and extra time to power up to stand with mild trunk sway noted, min guard for safety.  Ambulation/Gait Ambulation/Gait assistance: Min guard Gait Distance (Feet):  400 Feet Assistive device: Rolling walker (2 wheeled) Gait Pattern/deviations: Step-through pattern;Decreased stride length;Decreased step length - right;Trunk flexed Gait velocity: decr Gait velocity interpretation: 1.31 - 2.62 ft/sec, indicative of limited community ambulator General Gait Details: Ambulates with decreased R step length with R foot externally rotated. Improved step length when cued. Cued pt to increase stance width for improved stability, with mod success. Pt able to inc speed this date. No overt LOB, min guard for safety.   Stairs             Wheelchair Mobility    Modified Rankin (Stroke Patients Only) Modified Rankin (Stroke Patients Only) Pre-Morbid Rankin Score: Slight disability Modified Rankin: Moderately severe disability     Balance Overall balance assessment: Needs assistance Sitting-balance support: No upper extremity supported;Feet supported Sitting balance-Leahy Scale: Good Sitting balance - Comments: Static sitting EOB no LOB, min guard for safety.   Standing balance support: Bilateral upper extremity supported;During functional activity Standing balance-Leahy Scale: Poor Standing balance comment: reliant on UE support.                            Cognition Arousal/Alertness: Awake/alert Behavior During Therapy: WFL for tasks assessed/performed Overall Cognitive Status: Impaired/Different from baseline Area of Impairment: Safety/judgement;Memory                     Memory: Decreased short-term memory   Safety/Judgement: Decreased awareness of deficits;Decreased awareness of safety     General Comments:  Pt able to recall precaution not to lift arm above head. Pt demonstrated possible STM deficits asking about where the OT was several times during session, even after being informed of her location.      Exercises      General Comments General comments (skin integrity, edema, etc.): No pain in L foot with palpation  or PROm or activity this date      Pertinent Vitals/Pain Pain Assessment: No/denies pain Pain Intervention(s): Monitored during session    Home Living                      Prior Function            PT Goals (current goals can now be found in the care plan section) Acute Rehab PT Goals Patient Stated Goal: to go home PT Goal Formulation: With patient Time For Goal Achievement: 06/07/20 Potential to Achieve Goals: Good Progress towards PT goals: Progressing toward goals    Frequency    Min 3X/week      PT Plan Discharge plan needs to be updated    Co-evaluation              AM-PAC PT "6 Clicks" Mobility   Outcome Measure  Help needed turning from your back to your side while in a flat bed without using bedrails?: A Little Help needed moving from lying on your back to sitting on the side of a flat bed without using bedrails?: A Little Help needed moving to and from a bed to a chair (including a wheelchair)?: A Little Help needed standing up from a chair using your arms (e.g., wheelchair or bedside chair)?: A Little Help needed to walk in hospital room?: A Little Help needed climbing 3-5 steps with a railing? : Total 6 Click Score: 16    End of Session Equipment Utilized During Treatment: Gait belt Activity Tolerance: Patient tolerated treatment well Patient left: in chair;with call bell/phone within reach;with chair alarm set Nurse Communication: Mobility status PT Visit Diagnosis: Other abnormalities of gait and mobility (R26.89);Other symptoms and signs involving the nervous system (R29.898);Unsteadiness on feet (R26.81);Difficulty in walking, not elsewhere classified (R26.2)     Time: [id number] PT Time Calculation (min) (ACUTE ONLY): 15 min  Charges:  $Gait Training: 8-22 mins                     Moishe Spice, PT, DPT Acute Rehabilitation Services  Pager: [phone] Office: [place] 05/31/2020, 1:54 PM

## 2020-05-31 NOTE — Discharge Instructions (Signed)
After Your Pacemaker   . You have a Biotronik Pacemaker  ACTIVITY . Do not lift your arm above shoulder height for 1 week after your procedure. After 7 days, you may progress as below.  . You should remove your sling 24 hours after your procedure, unless otherwise instructed by your provider.     Monday June 04, 2020  Tuesday June 05, 2020 Wednesday June 06, 2020 Thursday June 07, 2020   . Do not lift, push, pull, or carry anything over 10 pounds with the affected arm until 6 weeks (Monday July 09, 2020 ) after your procedure.   . Do NOT DRIVE until you have been seen for your wound check, or as long as instructed by your healthcare provider.   . Ask your healthcare provider when you can go back to work   INCISION/Dressing . If you are on a blood thinner such as Coumadin, Xarelto, Eliquis, Plavix, or Pradaxa please confirm with your provider when this should be resumed.   . If large square, outer bandage is left in place, this can be removed after 24 hours from your procedure. Do not remove steri-strips or glue as below.   . Monitor your Pacemaker site for redness, swelling, and drainage. Call the device clinic at [phone] if you experience these symptoms or fever/chills.  . If your incision is sealed with Steri-strips or staples, you may shower 10 days after your procedure or when told by your provider. Do not remove the steri-strips or let the shower hit directly on your site. You may wash around your site with soap and water.    Marland Kitchen Avoid lotions, ointments, or perfumes over your incision until it is well-healed.  . You may use a hot tub or a pool AFTER your wound check appointment if the incision is completely closed.  Marland Kitchen PAcemaker Alerts:  Some alerts are vibratory and others beep. These are NOT emergencies. Please call our office to let us know. If this occurs at night or on weekends, it can wait until the next business day. Send a remote transmission.  . If your  device is capable of reading fluid status (for heart failure), you will be offered monthly monitoring to review this with you.   DEVICE MANAGEMENT . Remote monitoring is used to monitor your pacemaker from home. This monitoring is scheduled every 91 days by our office. It allows [place] to keep an eye on the functioning of your device to ensure it is working properly. You will routinely see your Electrophysiologist annually (more often if necessary).   . You should receive your ID card for your new device in 4-8 weeks. Keep this card with you at all times once received. Consider wearing a medical alert bracelet or necklace.  . Your Pacemaker may be MRI compatible. This will be discussed at your next office visit/wound check.  You should avoid contact with strong electric or magnetic fields.    Do not use amateur (ham) radio equipment or electric (arc) welding torches. MP3 player headphones with magnets should not be used. Some devices are safe to use if held at least 12 inches (30 cm) from your Pacemaker. These include power tools, lawn mowers, and speakers. If you are unsure if something is safe to use, ask your health care provider.   When using your cell phone, hold it to the ear that is on the opposite side from the Pacemaker. Do not leave your cell phone in a pocket over the Pacemaker.  You may safely use electric blankets, heating pads, computers, and microwave ovens.  Call the office right away if:  You have chest pain.  You feel more short of breath than you have felt before.  You feel more light-headed than you have felt before.  Your incision starts to open up.  This information is not intended to replace advice given to you by your health care provider. Make sure you discuss any questions you have with your health care provider.            Please make sure that you follow the care instructions provided for your pacemaker.  Please ensure that you follow-up with your  appointments that have been scheduled, and that you follow-up with your primary care physician in the next several days.  The spells of unresponsiveness that they have been having most likely have multiple conditions that could be leading to them; continue to follow-up if you have further spells want to leave the hospital.  You also underwent placement of a permanent pacemaker due to skin concerns for arrhythmia while your heart was being monitored.  You were previously on the medication Eliquis, we are no longer recommending this medication and discontinuing it upon her discharge.  You are also found to have gout in your left great toe and were treated with colchicine.  We highly recommend following up with your primary care provider to determine if you need to be on long-term medications to prevent further gout flares.  Due to your recent conditions, we recommend you avoid NSAIDs, this includes medications such as Aleve, Advil, Celebrex, ibuprofen, Mobic, Motrin, Toradol, and other NSAIDs.  You have the following appointments scheduled: - Wound check on 06/07/2020 at 12:00pm - Pacemaker check on 09/04/2020 at 3pm  Please schedule an appointment with your primary care physician for an appointment in the next 3-5 days.

## 2020-05-31 NOTE — Progress Notes (Signed)
Inpatient Rehabilitation-Admissions Coordinator   CIR consult received. Met with pt bedside. He states "I'm going home!" Per recent therapy notes, pt is Min G for mobility and transfers and home with home health is currently being recommended. Based on progress, feel this is appropriate. Pt reports his wife is aware of plan for home. AC has notified TOC team and will sign off.   Please call if questions.   Raechel Ache, OTR/L  Rehab Admissions Coordinator  [phone] 05/31/2020 3:11 PM

## 2020-05-31 NOTE — Progress Notes (Signed)
Pt discharged home via son.  He has all belongings with him including his cell phone and charger.  He verbalizes understanding of all discharge instructions including where to pick up prescriptions, and all follow-up appointments.  He has no questions.

## 2020-05-31 NOTE — TOC Transition Note (Signed)
Transition of Care [facility]) - CM/SW Discharge Note   Patient Details  Name: Dustin Hamilton MRN: [id number] Date of Birth: [birth year]  [facility] CM/SW Contact:  Kermit Balo, RN Phone Number: 05/31/2020, 3:29 PM   Clinical Narrative:    Pt is discharging home with [facility] services through [facility].  Pt has all needed DME at home.  Wife is able to provide supervision.  Pt has transport home.   Final next level of care: Home w Home Health Services Barriers to Discharge: No Barriers Identified   Patient Goals and CMS Choice   CMS Medicare.gov Compare Post Acute Care list provided to:: Patient Choice offered to / list presented to : [facility]  Discharge Placement                       Discharge Plan and Services                          HH Arranged: PT [facility] Agency: [facility] Date [facility] Agency Contacted: 05/31/20   Representative spoke with at [facility] Agency: [place]  Social Determinants of Health (SDOH) Interventions     Readmission Risk Interventions No flowsheet data found.

## 2020-06-07 ENCOUNTER — Other Ambulatory Visit: Payer: Self-pay

## 2020-06-07 ENCOUNTER — Ambulatory Visit (INDEPENDENT_AMBULATORY_CARE_PROVIDER_SITE_OTHER): Payer: Medicare PPO | Admitting: Emergency Medicine

## 2020-06-07 DIAGNOSIS — I442 Atrioventricular block, complete: Secondary | ICD-10-CM

## 2020-06-07 LAB — CUP PACEART INCLINIC DEVICE CHECK
Brady Statistic RA Percent Paced: 9 %
Brady Statistic RV Percent Paced: 6 %
Date Time Interrogation Session: 20220106123400
Implantable Lead Implant Date: 20211227
Implantable Lead Implant Date: 20211227
Implantable Lead Location: 753859
Implantable Lead Location: 753860
Implantable Lead Model: 377
Implantable Lead Model: 377
Implantable Lead Serial Number: 8000018960
Implantable Lead Serial Number: 8000042643
Implantable Pulse Generator Implant Date: 20211227
Lead Channel Impedance Value: 487 Ohm
Lead Channel Impedance Value: 604 Ohm
Lead Channel Pacing Threshold Amplitude: 0.9 V
Lead Channel Pacing Threshold Amplitude: 1.3 V
Lead Channel Pacing Threshold Pulse Width: 0.4 ms
Lead Channel Pacing Threshold Pulse Width: 0.4 ms
Lead Channel Sensing Intrinsic Amplitude: 12.7 mV
Lead Channel Sensing Intrinsic Amplitude: 3.6 mV
Lead Channel Setting Pacing Amplitude: 3 V
Lead Channel Setting Pacing Amplitude: 3 V
Lead Channel Setting Pacing Pulse Width: 0.4 ms
Pulse Gen Model: 407145
Pulse Gen Serial Number: 69915927

## 2020-06-07 NOTE — Progress Notes (Addendum)
Wound check appointment. Steri-strips removed. Wound without redness. Soft hematoma present with bruising at wound site that extends in left arm and axillary region. Dr Ladona Ridgel contacted and sent picture with patient permission. Incision edges approximated, wound well healed. Normal device function. Thresholds, sensing, and impedances consistent with implant measurements. Device programmed at 3.5V/auto capture programmed on for extra safety margin until 3 month visit. Histogram distribution appropriate for patient and level of activity. No mode switches or high ventricular rates noted. Patient educated about wound care, arm mobility, lifting restrictions. ROV for wound check 06/12/20 and with Dr Ladona Ridgel 09/04/20. Per DR Ladona Ridgel direct pressure applied to wound site x 5 minutes and elastiplast dressing applied. Ice packs to wound site QID for 20 minutes.Patient to call for increased edema, bleeding, drainage, or fever or chills.

## 2020-06-07 NOTE — Patient Instructions (Signed)
Keep dressing dry and clean. Apply ice packs to wound site for 20 minutes four times daily. Call office if any increased swelling, bleeding, drainage,if the area becomes hot to touch, or if any fever or chills. Office number Device clinic [phone].

## 2020-06-12 ENCOUNTER — Ambulatory Visit (INDEPENDENT_AMBULATORY_CARE_PROVIDER_SITE_OTHER): Payer: Medicare PPO | Admitting: Emergency Medicine

## 2020-06-12 ENCOUNTER — Other Ambulatory Visit: Payer: Self-pay

## 2020-06-12 DIAGNOSIS — I442 Atrioventricular block, complete: Secondary | ICD-10-CM

## 2020-06-12 NOTE — Patient Instructions (Signed)
Continue ice packs to wound site four times a day. Call the office if you have any increased swelling, any drainage or bleeding, or if you develop a fever or chills.  Device clinic: [phone]

## 2020-06-13 NOTE — Progress Notes (Signed)
Patient in for wound check due to hematoma at initial wound check 06/07/20. Pressure dressing removed. Edema decreased, no drainage or bleeding from wound site. Incision site edges approximated. Scaffe present at distal left edge of incision site unchanged. Dr Lovena Le given report and photo taken with patient permission. Return visit scheduled 06/19/20 per Dr Lovena Le. Patient to call office if he has increased edema , any drainage, any bleeding or a fever or chills.

## 2020-06-19 ENCOUNTER — Ambulatory Visit: Payer: Medicare PPO

## 2020-06-21 ENCOUNTER — Other Ambulatory Visit: Payer: Self-pay

## 2020-06-21 ENCOUNTER — Encounter (HOSPITAL_COMMUNITY): Payer: Self-pay

## 2020-06-21 ENCOUNTER — Emergency Department (HOSPITAL_COMMUNITY): Payer: No Typology Code available for payment source

## 2020-06-21 ENCOUNTER — Ambulatory Visit: Payer: Medicare PPO

## 2020-06-21 ENCOUNTER — Emergency Department (HOSPITAL_COMMUNITY)
Admission: EM | Admit: 2020-06-21 | Discharge: 2020-06-21 | Disposition: A | Discharge status: Home / Self Care | Payer: No Typology Code available for payment source | Attending: Emergency Medicine | Admitting: Emergency Medicine

## 2020-06-21 DIAGNOSIS — Z950 Presence of cardiac pacemaker: Secondary | ICD-10-CM | POA: Diagnosis not present

## 2020-06-21 DIAGNOSIS — Z87891 Personal history of nicotine dependence: Secondary | ICD-10-CM | POA: Diagnosis not present

## 2020-06-21 DIAGNOSIS — R55 Syncope and collapse: Secondary | ICD-10-CM | POA: Diagnosis not present

## 2020-06-21 DIAGNOSIS — I10 Essential (primary) hypertension: Secondary | ICD-10-CM | POA: Diagnosis not present

## 2020-06-21 DIAGNOSIS — Z79899 Other long term (current) drug therapy: Secondary | ICD-10-CM | POA: Diagnosis not present

## 2020-06-21 DIAGNOSIS — Z7982 Long term (current) use of aspirin: Secondary | ICD-10-CM | POA: Insufficient documentation

## 2020-06-21 LAB — COMPREHENSIVE METABOLIC PANEL
ALT: 17 U/L (ref 0–44)
AST: 19 U/L (ref 15–41)
Albumin: 3.5 g/dL (ref 3.5–5.0)
Alkaline Phosphatase: 65 U/L (ref 38–126)
Anion gap: 13 (ref 5–15)
BUN: 20 mg/dL (ref 8–23)
CO2: 16 mmol/L — ABNORMAL LOW (ref 22–32)
Calcium: 9.6 mg/dL (ref 8.9–10.3)
Chloride: 115 mmol/L — ABNORMAL HIGH (ref 98–111)
Creatinine, Ser: 1.38 mg/dL — ABNORMAL HIGH (ref 0.61–1.24)
GFR, Estimated: 52 mL/min — ABNORMAL LOW (ref >60)
Glucose, Bld: 90 mg/dL (ref 70–99)
Potassium: 4 mmol/L (ref 3.5–5.1)
Sodium: 144 mmol/L (ref 135–145)
Total Bilirubin: 0.5 mg/dL (ref 0.3–1.2)
Total Protein: 6.9 g/dL (ref 6.5–8.1)

## 2020-06-21 LAB — CBC WITH DIFFERENTIAL/PLATELET
Abs Immature Granulocytes: 0.02 10*3/uL (ref 0.00–0.07)
Basophils Absolute: 0 10*3/uL (ref 0.0–0.1)
Basophils Relative: 0 %
Eosinophils Absolute: 0.1 10*3/uL (ref 0.0–0.5)
Eosinophils Relative: 1 %
HCT: 48.6 % (ref 39.0–52.0)
Hemoglobin: 16.1 g/dL (ref 13.0–17.0)
Immature Granulocytes: 0 %
Lymphocytes Relative: 9 %
Lymphs Abs: 0.7 10*3/uL (ref 0.7–4.0)
MCH: 32 pg (ref 26.0–34.0)
MCHC: 33.1 g/dL (ref 30.0–36.0)
MCV: 96.6 fL (ref 80.0–100.0)
Monocytes Absolute: 0.4 10*3/uL (ref 0.1–1.0)
Monocytes Relative: 6 %
Neutro Abs: 6.4 10*3/uL (ref 1.7–7.7)
Neutrophils Relative %: 84 %
Platelets: 150 10*3/uL (ref 150–400)
RBC: 5.03 MIL/uL (ref 4.22–5.81)
RDW: 14.4 % (ref 11.5–15.5)
WBC: 7.6 10*3/uL (ref 4.0–10.5)
nRBC: 0 % (ref 0.0–0.2)

## 2020-06-21 LAB — TROPONIN I (HIGH SENSITIVITY)
Troponin I (High Sensitivity): 12 ng/L (ref <18)
Troponin I (High Sensitivity): 10 ng/L (ref <18)

## 2020-06-21 MED ORDER — IOHEXOL 350 MG/ML SOLN
80.0000 mL | Freq: Once | INTRAVENOUS | Status: AC | PRN
  Administered 2020-06-21: 80 mL via INTRAVENOUS

## 2020-06-21 MED ORDER — SODIUM CHLORIDE 0.9 % IV BOLUS
1000.0000 mL | Freq: Once | INTRAVENOUS | Status: AC
  Administered 2020-06-21: 1000 mL via INTRAVENOUS

## 2020-06-21 NOTE — ED Triage Notes (Signed)
Per [facility] ems, pt coming from home, reporting a month ago syncopal episode and having pacemaker in for a heart block, today ems found pt alert to verbal stimuli, could not follow commands. 12 lead showed 1st degree heart block rate 50, bop 80/60. Pacemaker is demand pacing, most bp 110/64, 200cc NS, cbg 144, spo2 92% on room air. 20g in left AC. Pt on room arrival aao4, hx of stroke no deficits.

## 2020-06-21 NOTE — ED Provider Notes (Signed)
[facility] EMERGENCY DEPARTMENT Provider Note   CSN: [id number] Arrival date & time: 06/21/20  1504     History Chief Complaint  Patient presents with  . Loss of Consciousness    Dustin Hamilton is a 80 y.o. male.  HPI Presented with syncopal episode.  History of multiple episodes of same.  Has had recent work-up for the same.  Reportedly had hypotension and decreased mental status.  Patient does not member the episode.  States he had been talking to a friend and then went to the other room to talk to his wife.  Reportedly then had the episode.  Woke up in the ambulance.  No headache.  No chest pain.  Feeling more at baseline now.  Has a Biotronik pacemaker recently placed.  Has had extensive work-up and thought to be somewhat type factorial episodes.  Has had MRIs and EEGs for same.  Has seen neurology.    Past Medical History:  Diagnosis Date  . BPH (benign prostatic hyperplasia)   . Cerebrovascular disease   . Congenital anomaly of diaphragm   . Elevated PSA   . Glaucoma, both eyes   . Hemorrhoid   . Hepatitis B surface antigen positive    02-20-2011  . History of adenomatous polyp of colon    2007, 2009 and 2013  tubular adenoma's  . History of alcohol abuse    quit 1963  . History of cerebral parenchymal hemorrhage    01/ 2006  left occiptial lobe related to hypertensive crisis  . History of CVA (cerebrovascular accident)    09-12-2012  left hippocampus/ amygdala junction and per MRI old white matter infarcts--  per pt residual short- term memory issues  . History of fatty infiltration of liver hx visit's at [facility] , last visit 05/ 2014   elvated LFT's ,  via liver bx 2004 related to hx alcohol and drug abuse (quit 1964)  . 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 HTN (hypertension)   . Renal cyst, left   . Stroke ([place])    hx of 3 strokes in past   . 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 Urethral lesion    urethral mass    Patient Active Problem List   Diagnosis Date Noted  . Complete heart block ([place])   . Heart block   . Seizure ([place]) 05/22/2020  . Syncope 05/03/2020  . History of stroke 05/03/2020  . History of supraventricular tachycardia 05/03/2020  . Thrombocytopenia ([place]) 05/03/2020  . Acute kidney injury ([place]) 05/03/2020  . Memory impairment 05/03/2020  . Atypical chest pain 01/27/2020  . SVT (supraventricular tachycardia) ([place]) 06/03/2017  . Dizziness 08/02/2014  . 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 CVA (cerebral infarction) 09/13/2012  . Vertigo 09/12/2012  . EAR PAIN 09/05/2009  . BACK PAIN 06/20/2009  . COLONIC POLYPS 05/06/2007  . 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 NONDEPENDENT ALCOHOL ABUSE IN REMISSION 05/06/2007  . HYPERTENSION, SEVERE 05/06/2007  . Hypertensive heart disease without heart failure 05/06/2007  . INTRACRANIAL HEMORRHAGE 05/06/2007  . Cerebrovascular disease 05/06/2007  . HEMORRHOIDS 05/06/2007  . INGUINAL HERNIA, RIGHT 05/06/2007  . RENAL CYST 05/06/2007  . HEMATURIA UNSPECIFIED 05/06/2007  . 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 PSA, INCREASED 05/06/2007  . LIVER FUNCTION TESTS, ABNORMAL 05/06/2007  . BPH (benign prostatic hypertrophy) with urinary obstruction 05/06/2007    Past Surgical History:  Procedure Laterality Date  . CARDIOVASCULAR STRESS TEST  05/05/2007   normal nuclear study w/ no ischemia/  normal LV fucntion and wall motion , ef60%  . COLONOSCOPY  last one 04-06-2012  . CYSTO/  LEFT RETROGRADE PYELOGRAM/ CYTOLOGY WASHINGS/  URETEROSCOPY  03/05/2000  . INGUINAL HERNIA REPAIR Bilateral 1965 and 1980's  . LAPAROSCOPIC INGUINAL HERNIA WITH UMBILICAL HERNIA Right 66/11/3014  . LIVER BIOPSY  1980's and 2004  . 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   Procedure: PACEMAKER IMPLANT;  Surgeon: Evans Lance, MD;  Location: [facility];  Service: Cardiovascular;   Laterality: N/A;  . SVT ABLATION N/A 11/15/2018   Procedure: SVT ABLATION;  Surgeon: Evans Lance, MD;  Location: [facility];  Service: Cardiovascular;  Laterality: N/A;  . TRANSTHORACIC ECHOCARDIOGRAM  09/13/2012   moderate LVH,  ef 60-65%/    . TRANSURETHRAL RESECTION OF BLADDER TUMOR N/A 08/11/2016   Procedure: TRANSURETHRAL RESECTION OF BLADDER TUMOR (TURBT);  Surgeon: Cleon Gustin, MD;  Location: [facility];  Service: Urology;  Laterality: N/A;       Family History  Problem Relation Age of Onset  . Rheum arthritis Mother   . Diabetes Mother   . Stroke Mother   . Heart attack Mother   . Kidney failure Mother   . Heart attack Father   . Heart disease Maternal Grandmother   . Rheum arthritis Maternal Grandmother   . Diabetes Maternal Grandmother   . Stroke Maternal Grandmother   . Colon cancer Neg Hx     Social History   Tobacco Use  . Smoking status: Former Smoker    Packs/day: 1.00    Years: 5.00    Pack years: 5.00    Types: Cigarettes    Quit date: 11/30/1981    Years since quitting: 38.5  . Smokeless tobacco: Never Used  Vaping Use  . Vaping Use: Never used  Substance Use Topics  . Alcohol use: No    Alcohol/week: 0.0 standard drinks    Comment: hx abuse -- quit:  1963  . Drug use: No    Comment: hx abuse -- quit 1964 (iv heroin and cocaine    Home Medications Prior to Admission medications   Medication Sig Start Date End Date Taking? Authorizing Provider  amLODipine (NORVASC) 5 MG tablet Take 5 mg by mouth daily.    [provider]  aspirin EC 81 MG EC tablet Take 1 tablet (81 mg total) by mouth daily. Swallow whole. 06/01/20   Lilland, Alana, DO  atorvastatin (LIPITOR) 40 MG tablet Take 1 tablet (40 mg total) by mouth daily. 06/01/20   Lilland, Alana, DO  brimonidine (ALPHAGAN) 0.2 % ophthalmic solution Place 1 drop into both eyes 2 (two) times daily.    [provider]  colchicine 0.6 MG tablet Take 1  tablet (0.6 mg total) by mouth daily. 06/01/20   Lilland, Alana, DO  dutasteride (AVODART) 0.5 MG capsule Take 0.5 mg by mouth daily.    [provider]  latanoprost (XALATAN) 0.005 % ophthalmic solution Place 1 drop into both eyes at bedtime.     [provider]  memantine (NAMENDA) 5 MG tablet Take 17.5 mg by mouth at bedtime.     [provider]  metoprolol succinate (TOPROL-XL) 25 MG 24 hr tablet Take 0.5 tablets (12.5 mg total) by mouth at bedtime. 05/31/20   Lilland, Alana, DO  tamsulosin (FLOMAX) 0.4 MG CAPS capsule Take 0.4 mg by mouth at bedtime.  [provider]    Allergies    Penicillins  Review of Systems   Review of Systems  Constitutional: Negative for appetite change.  HENT: Negative for congestion.   Respiratory: Negative for shortness of breath.   Cardiovascular: Negative for chest pain.  Gastrointestinal: Negative for abdominal pain.  Genitourinary: Negative for flank pain.  Musculoskeletal: Negative for back pain.  Skin: Negative for rash.  Neurological: Positive for syncope. Negative for headaches.  Psychiatric/Behavioral: Negative for confusion.    Physical Exam Updated Vital Signs BP 135/81 (BP Location: Right Arm)   Pulse 60   Resp 19   Ht 5\' 11"  (1.803 m)   Wt 74.4 kg   SpO2 96%   BMI 22.87 kg/m   Physical Exam Vitals and nursing note reviewed.  Constitutional:      Appearance: Normal appearance.  HENT:     Head: Normocephalic.     Mouth/Throat:     Mouth: Mucous membranes are moist.  Eyes:     Pupils: Pupils are equal, round, and reactive to light.  Cardiovascular:     Rate and Rhythm: Regular rhythm.  Pulmonary:     Breath sounds: No wheezing or rhonchi.  Abdominal:     Tenderness: There is no abdominal tenderness.  Musculoskeletal:     Cervical back: Neck supple.  Skin:    General: Skin is warm.     Capillary Refill: Capillary refill takes less than 2 seconds.  Neurological:     Mental Status:  He is alert and oriented to person, place, and time.  Psychiatric:        Mood and Affect: Mood normal.     ED Results / Procedures / Treatments   Labs (all labs ordered are listed, but only abnormal results are displayed) Labs Reviewed  COMPREHENSIVE METABOLIC PANEL  CBC WITH DIFFERENTIAL/PLATELET  TROPONIN I (HIGH SENSITIVITY)    EKG EKG Interpretation  Date/Time:  Thursday June 21 2020 16:06:22 EST Ventricular Rate:  62 PR Interval:    QRS Duration: 130 QT Interval:  401 QTC Calculation: 408 R Axis:   -76 Text Interpretation: Sinus rhythm Prolonged PR interval Nonspecific IVCD with LAD Anterior infarct, old Confirmed by Lennice Sites [phone]) on 06/21/2020 4:12:01 PM   Radiology No results found.  Procedures Procedures (including critical care time)  Medications Ordered in ED Medications - No data to display  ED Course  I have reviewed the triage vital signs and the nursing notes.  Pertinent labs & imaging results that were available during my care of the patient were reviewed by me and considered in my medical decision making (see chart for details).    MDM Rules/Calculators/A&P                          Patient with syncopal episode.  History of same.  Has had extensive work-up for same.  Will interrogate Biotronik pacemaker.  If reassuring I think likely should be able to go home assuming lab work is also reassuring.  Has follow-up with neurology and has some other outpatient plans.  May need ENT follow-up.  Care turned over to Dr. Ronnald Nian Final Clinical Impression(s) / ED Diagnoses Final diagnoses:  Syncope, unspecified syncope type    Rx / DC Orders ED Discharge Orders    None       Davonna Belling, MD 06/21/20 [phone]

## 2020-06-21 NOTE — ED Notes (Signed)
Holley Raring, pt wife, called for an update. Please call [phone]

## 2020-06-21 NOTE — ED Notes (Signed)
fasmily updated at this time.

## 2020-06-21 NOTE — ED Provider Notes (Signed)
Assumed care of patient at 4 PM. Here after possible syncopal event. Unremarkable vitals. No fever. Recently had a pacemaker placed for complete heart block. EKG shows sinus rhythm. Medtronic pacemaker was evaluated and there have been no issues with that. He is neurologically intact and asymptomatic now. He has had extensive work-up for these episodes. Per Dr. Alvino Chapel and he is expected to still have some episodes may be at times per EP note. He has had a neurologic work-up he has had EEGs and MRIs supposedly. There was a concern for may be some orthostasis or vertigo symptoms as well. We'll check basic screening labs including chest x-ray but anticipate discharge to home. We'll follow-up his lab work.  Overall lab work is unremarkable. Feeling better after IV fluids. Able to ambulate without any issues. CTA of head and neck was unremarkable. Did have some mild elevation in his kidney function but likely some dehydration. Recommend close follow-up with primary care doctor. Overall this appears to be a chronic ongoing issue. There is no problem with the pacemaker. Possibly may need referral to ENT but recommend that he had discussion with his primary care doctor to maybe look at some of his medications that may be causing some intermittent hypotension. Understands return precautions and discharged in ED in good condition.   This chart was dictated using voice recognition software.  Despite best efforts to proofread,  errors can occur which can change the documentation meaning.     Lennice Sites, DO 06/21/20 2017

## 2020-06-21 NOTE — ED Notes (Signed)
Pt reports loosing his watch on ems truck, Network engineer to call ems bay. No valuables found inside of bed at this time.

## 2020-06-25 ENCOUNTER — Ambulatory Visit (INDEPENDENT_AMBULATORY_CARE_PROVIDER_SITE_OTHER): Payer: No Typology Code available for payment source | Admitting: Emergency Medicine

## 2020-06-25 ENCOUNTER — Telehealth: Payer: Self-pay

## 2020-06-25 ENCOUNTER — Other Ambulatory Visit: Payer: Self-pay

## 2020-06-25 DIAGNOSIS — I442 Atrioventricular block, complete: Secondary | ICD-10-CM

## 2020-06-25 NOTE — Progress Notes (Signed)
Patient seen in device clinic for wound recheck. Swelling noted (has reduced some per patient) and firm to palpation. No redness, warmth or fevers.  Dr. Lovena Le reviewed via picture image. Patient is not on [facility], he would like for patient to have wound recheck in 1 week when he is in office. Apt. scheduled 07/05/20 @ 8:0 am.    Wound care/eduation completed. Encouraged to continue to use ice to reduce swelling.

## 2020-06-25 NOTE — Telephone Encounter (Signed)
Spoke with pt spouse, [name] to recheck Hematoma recheck that was cancelled last week due to weather/ ED visit.  Spt needs to be seen on a day when GT is on office.  Scheduled pt for today at 1430.  Wife v/u that appt is at [facility].

## 2020-06-25 NOTE — Patient Instructions (Signed)
Please continue to monitor for increased swelling, redness, warmth fever or chills. If you see any please call the [facility] [phone]. We will see you in 1 week on 07/05/20 @ 8:40 AM.

## 2020-06-26 ENCOUNTER — Telehealth: Payer: No Typology Code available for payment source | Admitting: Physician Assistant

## 2020-06-26 NOTE — Progress Notes (Signed)
Virtual Visit via Video Note   This visit type was conducted due to national recommendations for restrictions regarding the COVID-19 Pandemic (e.g. social distancing) in an effort to limit this patient's exposure and mitigate transmission in our community.  Due to his co-morbid illnesses, this patient is at least at moderate risk for complications without adequate follow up.  This format is felt to be most appropriate for this patient at this time.  All issues noted in this document were discussed and addressed.  A limited physical exam was performed with this format.  Please refer to the patient's chart for his consent to telehealth for [facility].     Date:  06/27/2020   ID:  Dustin Hamilton, DOB November 30, 1940, MRN [id number] The patient was identified using 2 identifiers.  Patient Location: Home Provider Location: Office/Clinic  PCP:  Clinic, [facility]  Cardiologist:  Quay Burow, MD  Electrophysiologist:  Cristopher Peru, MD   Evaluation Performed:  Follow-Up Visit  Chief Complaint:  Follow up for syncope  History of Present Illness:    Dustin Hamilton is a 80 y.o. 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  He was hospitalized earlier in December 2021 for syncope and diagnosed with epilepsy during the hospitalization.  Unfortunately he was unable to tolerate his antiseizure medication.  Heart monitor placed at that time did show episodes of complete heart block with pauses of up to 8 seconds. It was noted that events surrounding the episode of complete heart block and pauses were unknown (e.g., ?during syncope +/- ?seizure).   He was referred to EP and saw Dr. Quentin Ore on 05/22/2020.  When he presented for that appointment, he slumped over and a CODE BLUE was activated in the waiting room.  Subsequent twelve-lead EKG showed atrial tachycardia in the 130s.  He was  transported via EMS to the emergency room.  He was admitted and underwent neurological evaluation with EEG monitoring.  He subsequently had a Biotronik dual-chamber PPM placed on 05/28/2020.   While patient indeed had intermittent complete heart block, his syncope and recent epilepsy diagnosis has complicated his picture.  EEG during his last admission was negative for seizures or epileptiform changes.  His description of events also have features of vasovagal syncope / orthostatic intolerance.  Syncope may be multifactorial and unsure if PPM implantation will solve all of his syncope/dizziness.  It was noted by EP that prior strips were reviewed and atrial flutter was in question.  DOAC was placed on hold.  Will monitor for arrhythmia via device download.  He was recently seen in [facility], ER on 06/21/2020 with an episode of syncope.  Per EDP, he had an episode of hypotension and decreased mental status.  He has had extensive prior work-up for syncope and follows with neurology.  Workup was largely unremarkable and he was discharged without admission.  He presents for follow up. He has not had any further syncope. PT comes to his home. BP was "low" at 94/58 - 88/60. Pt was lying in bed asleep and reports dizziness, which improved when he got up and walked the halls - no further dizziness. BP now running 130/80s without dizziness. Generally 121/71 HR 73. He reports dizziness when lying in bed most days.   His wife doesn't know why he was started on lipitor during his last hospitalization. She hasn't been giving it to him. In review of [facility],  LDL was 49 in 2020. Will stop this.    The patient does not have symptoms concerning for COVID-19 infection (fever, chills, cough, or new shortness of breath).    Past Medical History:  Diagnosis Date  . BPH (benign prostatic hyperplasia)   . Cerebrovascular disease   . Congenital anomaly of diaphragm   . Elevated PSA   . Glaucoma, both eyes   . Hemorrhoid    . Hepatitis B surface antigen positive    02-20-2011  . History of adenomatous polyp of colon    2007, 2009 and 2013  tubular adenoma's  . History of alcohol abuse    quit 1963  . History of cerebral parenchymal hemorrhage    01/ 2006  left occiptial lobe related to hypertensive crisis  . History of CVA (cerebrovascular accident)    09-12-2012  left hippocampus/ amygdala junction and per MRI old white matter infarcts--  per pt residual short- term memory issues  . History of fatty infiltration of liver hx visit's at [facility] , last visit 05/ 2014   elvated LFT's ,  via liver bx 2004 related to hx alcohol and drug abuse (quit 1964)  . 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 HTN (hypertension)   . Renal cyst, left   . Stroke ([place])    hx of 3 strokes in past   . 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 Urethral lesion    urethral mass   Past Surgical History:  Procedure Laterality Date  . CARDIOVASCULAR STRESS TEST  05/05/2007   normal nuclear study w/ no ischemia/  normal LV fucntion and wall motion , ef60%  . COLONOSCOPY  last one 04-06-2012  . CYSTO/  LEFT RETROGRADE PYELOGRAM/ CYTOLOGY WASHINGS/  URETEROSCOPY  03/05/2000  . INGUINAL HERNIA REPAIR Bilateral 1965 and 1980's  . LAPAROSCOPIC INGUINAL HERNIA WITH UMBILICAL HERNIA Right 30/86/5784  . LIVER BIOPSY  1980's and 2004  . 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   Procedure: PACEMAKER IMPLANT;  Surgeon: Evans Lance, MD;  Location: [facility];  Service: Cardiovascular;  Laterality: N/A;  . SVT ABLATION N/A 11/15/2018   Procedure: SVT ABLATION;  Surgeon: Evans Lance, MD;  Location: [facility];  Service: Cardiovascular;  Laterality: N/A;  . TRANSTHORACIC ECHOCARDIOGRAM  09/13/2012   moderate LVH,  ef 60-65%/    . TRANSURETHRAL RESECTION OF BLADDER TUMOR N/A 08/11/2016   Procedure: TRANSURETHRAL RESECTION OF BLADDER TUMOR (TURBT);  Surgeon:  Cleon Gustin, MD;  Location: [facility];  Service: Urology;  Laterality: N/A;     Current Meds  Medication Sig  . allopurinol (ZYLOPRIM) 100 MG tablet Take 100 mg by mouth daily.  Marland Kitchen amLODipine (NORVASC) 5 MG tablet Take 5 mg by mouth daily.  Marland Kitchen aspirin EC 81 MG EC tablet Take 1 tablet (81 mg total) by mouth daily. Swallow whole.  Marland Kitchen atorvastatin (LIPITOR) 40 MG tablet Take 1 tablet (40 mg total) by mouth daily.  . brimonidine (ALPHAGAN) 0.2 % ophthalmic solution Place 1 drop into both eyes 2 (two) times daily.  . colchicine 0.6 MG tablet Take 1 tablet (0.6 mg total) by mouth daily.  Marland Kitchen dutasteride (AVODART) 0.5 MG capsule Take 0.5 mg by mouth daily.  Marland Kitchen latanoprost (XALATAN) 0.005 % ophthalmic solution Place 1 drop into both eyes at bedtime.   . memantine (NAMENDA) 5 MG tablet Take 17.5 mg by mouth  at bedtime.   . metoprolol succinate (TOPROL-XL) 25 MG 24 hr tablet Take 0.5 tablets (12.5 mg total) by mouth at bedtime.  . tamsulosin (FLOMAX) 0.4 MG CAPS capsule Take 0.4 mg by mouth at bedtime.      Allergies:   Penicillins   Social History   Tobacco Use  . Smoking status: Former Smoker    Packs/day: 1.00    Years: 5.00    Pack years: 5.00    Types: Cigarettes    Quit date: 11/30/1981    Years since quitting: 38.6  . Smokeless tobacco: Never Used  Vaping Use  . Vaping Use: Never used  Substance Use Topics  . Alcohol use: No    Alcohol/week: 0.0 standard drinks    Comment: hx abuse -- quit:  1963  . Drug use: No    Comment: hx abuse -- quit 1964 (iv heroin and cocaine     Family Hx: The patient's family history includes Diabetes in his maternal grandmother and mother; Heart attack in his father and mother; Heart disease in his maternal grandmother; Kidney failure in his mother; Rheum arthritis in his maternal grandmother and mother; Stroke in his maternal grandmother and mother. There is no history of Colon cancer.  ROS:   Please see the history of present  illness.     All other systems reviewed and are negative.   Prior CV studies:   The following studies were reviewed today:  Echo 12/21 1. Left ventricular ejection fraction, by estimation, is 60 to 65%. The  left ventricle has normal function. The left ventricle has no regional  wall motion abnormalities. There is mild asymmetric left ventricular  hypertrophy of the basal-septal segment.  Left ventricular diastolic parameters are consistent with Grade I  diastolic dysfunction (impaired relaxation).  2. Right ventricular systolic function is normal. The right ventricular  size is mildly enlarged.  3. A small pericardial effusion is present. The pericardial effusion is  circumferential. There is no evidence of cardiac tamponade.  4. The mitral valve is grossly normal. No evidence of mitral valve  regurgitation. No evidence of mitral stenosis.  5. The aortic valve is tricuspid. There is mild calcification of the  aortic valve. Aortic valve regurgitation is not visualized. Mild aortic  valve sclerosis is present, with no evidence of aortic valve stenosis.  6. The inferior vena cava is normal in size with greater than 50%  respiratory variability, suggesting right atrial pressure of 3 mmHg.   Labs/Other Tests and Data Reviewed:    EKG:  No ECG reviewed.  Recent Labs: 05/04/2020: TSH 1.050 05/22/2020: Magnesium 1.9 06/21/2020: ALT 17; BUN 20; Creatinine, Ser 1.38; Hemoglobin 16.1; Platelets 150; Potassium 4.0; Sodium 144   Recent Lipid Panel Lab Results  Component Value Date/Time   CHOL 123 09/13/2012 03:30 AM   TRIG 112 09/13/2012 03:30 AM   HDL 25 (L) 09/13/2012 03:30 AM   CHOLHDL 4.9 09/13/2012 03:30 AM   LDLCALC 76 09/13/2012 03:30 AM    Wt Readings from Last 3 Encounters:  06/27/20 164 lb (74.4 kg)  06/21/20 164 lb (74.4 kg)  05/22/20 169 lb 15.6 oz (77.1 kg)     Risk Assessment/Calculations:      Objective:    Vital Signs:  BP 139/86 (BP Location: Right  Arm, Patient Position: Sitting)   Pulse 61   Ht 5\' 11"  (1.803 m)   Wt 164 lb (74.4 kg)   BMI 22.87 kg/m    VITAL SIGNS:  reviewed GEN:  no  acute distress EYES:  sclerae anicteric, EOMI - Extraocular Movements Intact RESPIRATORY:  normal respiratory effort, symmetric expansion CARDIOVASCULAR:  no peripheral edema SKIN:  no rash, lesions or ulcers. MUSCULOSKELETAL:  no obvious deformities. NEURO:  alert and oriented x 3, no obvious focal deficit PSYCH:  normal affect  ASSESSMENT & PLAN:    Intermittent complete heart block Status post PPM - no further syncope since ER visit - per EP   Seizures Vasovagal syncope Orthostatic hypotension - he has been hypotensive in the morning with dizziness - will cut his amlodipine to 2.5 mg BID - may need to stop altogether - flomax may also be contributing   History of AVNRT ablation - no palpitations   Hypertension - careful balance given his hypotension - will reduce amlodipine to 2.5 mg BID as above   Will stop lipitor - not taking now anyway. [facility].   Will fax this note to his [facility] provider: [phone]   Dr. Carney Bern   COVID-19 Education: The signs and symptoms of COVID-19 were discussed with the patient and how to seek care for testing (follow up with PCP or arrange E-visit).  The importance of social distancing was discussed today.  Time:   Today, I have spent 25 minutes with the patient with telehealth technology discussing the above problems.     Medication Adjustments/Labs and Tests Ordered: Current medicines are reviewed at length with the patient today.  Concerns regarding medicines are outlined above.   Tests Ordered: No orders of the defined types were placed in this encounter.   Medication Changes: No orders of the defined types were placed in this encounter.   Follow Up:  In Person in 3 month(s)  Signed, Ledora Bottcher, [place]  06/27/2020 11:26 AM    [place]

## 2020-06-27 ENCOUNTER — Encounter: Payer: Self-pay | Admitting: Physician Assistant

## 2020-06-27 ENCOUNTER — Telehealth (INDEPENDENT_AMBULATORY_CARE_PROVIDER_SITE_OTHER): Payer: No Typology Code available for payment source | Admitting: Physician Assistant

## 2020-06-27 VITALS — BP 139/86 | HR 61 | Ht 71.0 in | Wt 164.0 lb

## 2020-06-27 DIAGNOSIS — Z950 Presence of cardiac pacemaker: Secondary | ICD-10-CM | POA: Diagnosis not present

## 2020-06-27 DIAGNOSIS — I441 Atrioventricular block, second degree: Secondary | ICD-10-CM | POA: Diagnosis not present

## 2020-06-27 DIAGNOSIS — I442 Atrioventricular block, complete: Secondary | ICD-10-CM

## 2020-06-27 DIAGNOSIS — R55 Syncope and collapse: Secondary | ICD-10-CM

## 2020-06-27 DIAGNOSIS — R42 Dizziness and giddiness: Secondary | ICD-10-CM

## 2020-06-27 DIAGNOSIS — I471 Supraventricular tachycardia: Secondary | ICD-10-CM

## 2020-06-27 MED ORDER — AMLODIPINE BESYLATE 2.5 MG PO TABS: 2.5000 mg | ORAL_TABLET | Freq: Two times a day (BID) | ORAL | 3 refills | Status: DC

## 2020-06-27 NOTE — Addendum Note (Signed)
Addended by: Merri Ray A on: 06/27/2020 11:46 AM   Modules accepted: Orders

## 2020-06-27 NOTE — Patient Instructions (Signed)
Medication Instructions:  Start Amlodipine 2.5 ( 1 Tablet Twice Daily *If you need a refill on your cardiac medications before your next appointment, please call your pharmacy*   Lab Work: No Labs If you have labs (blood work) drawn today and your tests are completely normal, you will receive your results only by: Marland Kitchen MyChart Message (if you have MyChart) OR . A paper copy in the mail If you have any lab test that is abnormal or we need to change your treatment, we will call you to review the results.   Testing/Procedures: No Testing   Follow-Up: At [facility], you and your health needs are our priority.  As part of our continuing mission to provide you with exceptional heart care, we have created designated Provider Care Teams.  These Care Teams include your primary Cardiologist (physician) and Advanced Practice Providers (APPs -  Physician Assistants and Nurse Practitioners) who all work together to provide you with the care you need, when you need it.      Your next appointment:   July 24, 2020 10:30 AM  The format for your next appointment:   In Person  Provider:   Quay Burow, MD

## 2020-07-05 ENCOUNTER — Telehealth: Payer: Self-pay

## 2020-07-05 ENCOUNTER — Other Ambulatory Visit: Payer: Self-pay

## 2020-07-05 ENCOUNTER — Ambulatory Visit (INDEPENDENT_AMBULATORY_CARE_PROVIDER_SITE_OTHER): Payer: No Typology Code available for payment source | Admitting: Emergency Medicine

## 2020-07-05 DIAGNOSIS — I442 Atrioventricular block, complete: Secondary | ICD-10-CM

## 2020-07-05 NOTE — Progress Notes (Signed)
Wound recheck . Wound site with decreased edema, no drainage, redness, bleeding or pain at wound site. Follow-up with Dr Lovena Le 08/28/20 as scheduled. Call the device clinic with any changes in wound site. Picture included with patient permission.

## 2020-07-05 NOTE — Telephone Encounter (Signed)
I let the patient know that he left his glasses. He is coming back to get it in a few minutes.

## 2020-07-05 NOTE — Patient Instructions (Signed)
No lifting, pushing or pulling more than 10 lbs until 07/09/20.

## 2020-07-23 ENCOUNTER — Telehealth: Payer: Self-pay | Admitting: Internal Medicine

## 2020-07-23 NOTE — Telephone Encounter (Signed)
Pt has Biotronik device, transmitting nightly.  Last transmission received 07/23/20 at 1:21am.  Report shows normal device function, no arrhythmias detected.  Device Programmed DDD 60bpm with UTR of 130.

## 2020-07-23 NOTE — Telephone Encounter (Signed)
Spoke with patient's wife - patient of Gwenlyn Found MD (gen cards), Lovena Le MD (EP) He had an episode last night around 5:30pm where he said his heart was "rapidly beating" The episode lasted about 5 minutes HR was 84 during this episode After episode on 2/20: BP 127/78 Today: HR 68, 128/78  No acute complaints at present  Asked that patient's wife send in manual remote transmission to be reviewed - she is unsure how to do this so I advised I will send a message to the device clinic team to assist  Routed to heartcare device/Dr. Lovena Le to review report once available

## 2020-07-23 NOTE — Telephone Encounter (Signed)
STAT if HR is under 50 or over 120 (normal HR is 60-100 beats per minute)  1) What is your heart rate? Did not check HR  2) Do you have a log of your heart rate readings (document readings)? Sunday at 5 or 5:30 pm felt heart racing, BP was 134/ something HR was 84  3) Do you have any other symptoms? Felt like heart was racing going fast then slowed down, no other symptoms   Patient's wife states the patient felt his heart racing yesterday at around 5 or 5:30 pm. She states it did slow down and his HR was 84.

## 2020-07-23 NOTE — Telephone Encounter (Signed)
Damian Leavell, RN  You 24 minutes ago (11:03 AM)     Are you able to call to let them know that there was no heart racing. Sometimes acid reflux can feel like heart racing.

## 2020-07-23 NOTE — Telephone Encounter (Signed)
Patient's wife aware of Amy RN's note and Anderson Malta RN's note, per below Patient has visit with Dr. Gwenlyn Found tomorrow

## 2020-07-24 ENCOUNTER — Other Ambulatory Visit: Payer: Self-pay

## 2020-07-24 ENCOUNTER — Ambulatory Visit (INDEPENDENT_AMBULATORY_CARE_PROVIDER_SITE_OTHER): Payer: Medicare PPO | Admitting: Cardiovascular Disease

## 2020-07-24 ENCOUNTER — Encounter: Payer: Self-pay | Admitting: Cardiovascular Disease

## 2020-07-24 DIAGNOSIS — I10 Essential (primary) hypertension: Secondary | ICD-10-CM | POA: Diagnosis not present

## 2020-07-24 DIAGNOSIS — R0789 Other chest pain: Secondary | ICD-10-CM | POA: Diagnosis not present

## 2020-07-24 DIAGNOSIS — I442 Atrioventricular block, complete: Secondary | ICD-10-CM | POA: Diagnosis not present

## 2020-07-24 NOTE — Assessment & Plan Note (Signed)
History of hypertension on amlodipine and metoprolol.  Blood pressure this morning is 100/78.  Earlier this morning it was 80/50 the patient was dizzy and somnolent.  Its gradually increased.  I am going to stop the amlodipine, have him keep a blood pressure log for 30 days and see a Pharm.D. after that for further evaluation.

## 2020-07-24 NOTE — Patient Instructions (Addendum)
Medication Instructions:   -Stop taking amlodipine.   *If you need a refill on your cardiac medications before your next appointment, please call your pharmacy*   Follow-Up: At [facility], you and your health needs are our priority.  As part of our continuing mission to provide you with exceptional heart care, we have created designated Provider Care Teams.  These Care Teams include your primary Cardiologist (physician) and Advanced Practice Providers (APPs -  Physician Assistants and Nurse Practitioners) who all work together to provide you with the care you need, when you need it.  We recommend signing up for the patient portal called "MyChart".  Sign up information is provided on this After Visit Summary.  MyChart is used to connect with patients for Virtual Visits (Telemedicine).  Patients are able to view lab/test results, encounter notes, upcoming appointments, etc.  Non-urgent messages can be sent to your provider as well.   To learn more about what you can do with MyChart, go to [email or web address].    Your next appointment:   3 month(s)  The format for your next appointment:   In Person  Provider:   You will see one of the following Advanced Practice Providers on your designated Care Team:    Fabian Sharp, [place]  Then, Quay Burow, MD will plan to see you again in 6 month(s).    Other Instructions Please keep a blood pressure log for 4 weeks and come back to see a PharmD at that time.

## 2020-07-24 NOTE — Progress Notes (Signed)
07/24/2020 Dustin Hamilton   [birth year]  [id number]  Primary Physician Clinic, [facility] Primary Cardiologist: Dustin Harp MD Dustin Hamilton, [place]  HPI:  Dustin Hamilton is a 80 y.o.  thin-appearing married African-American male father of one, grandfather of 2 grandchildren who was referred by the emergency room for evaluation of her recent episode of PSVT.  I last saw him in the office 01/27/2020. He is accompanied by his wife Dustin Hamilton today.  His primary care provider is Dr. Dell Hamilton at the [facility].He does have a history of treated hypertension. He's never had a heart attack or stroke. He has been his recent substernal chest pain over the last several months. He presented to the [facility] emergency room on 05/30/17 with PSVT and hypotension. This broke with typical Valsalva maneuver. He has had no recurrent episodes.  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  He did have a negative Myoview after his last office visit with me on 06/12/2017.  He was just recently seen in the emergency room for complaints of chest pain 01/22/2020 with negative work-up.  He has had recurrent dizzy episodes and actually had an episode which I witnessed in the exam room today.  I checked his pulse at the time which was in the 60 range suggesting that it was not arrhythmic chemically mediated.  I saw him in the hospital in late December for syncope.  He had a neurologic work-up as well.  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 Lovena Hamilton 05/28/2020.  Recently as he has been having symptomatic hypotension.  His blood pressure according to his wife this morning was 80/60.  He was weak and dizzy at that time.    Current Meds  Medication Sig  . allopurinol (ZYLOPRIM) 100 MG tablet Take 100 mg by mouth daily.  Marland Kitchen aspirin EC 81 MG EC tablet Take 1 tablet (81 mg total) by mouth daily. Swallow whole.  .  brimonidine (ALPHAGAN) 0.2 % ophthalmic solution Place 1 drop into both eyes 2 (two) times daily.  Marland Kitchen dutasteride (AVODART) 0.5 MG capsule Take 0.5 mg by mouth daily.  Marland Kitchen latanoprost (XALATAN) 0.005 % ophthalmic solution Place 1 drop into both eyes at bedtime.   . memantine (NAMENDA) 5 MG tablet Take 17.5 mg by mouth at bedtime.   . metoprolol succinate (TOPROL-XL) 25 MG 24 hr tablet Take 0.5 tablets (12.5 mg total) by mouth at bedtime.  . tamsulosin (FLOMAX) 0.4 MG CAPS capsule Take 0.4 mg by mouth at bedtime.   . [DISCONTINUED] amLODipine (NORVASC) 2.5 MG tablet Take 1 tablet (2.5 mg total) by mouth 2 (two) times daily.  . [DISCONTINUED] atorvastatin (LIPITOR) 40 MG tablet Take 1 tablet (40 mg total) by mouth daily.  . [DISCONTINUED] colchicine 0.6 MG tablet Take 1 tablet (0.6 mg total) by mouth daily.     Allergies  Allergen Reactions  . Penicillins Hives    Social History   Socioeconomic History  . Marital status: Married    Spouse name: Dustin Hamilton  . Number of children: 1  . Years of education: College  . Highest education level: Not on file  Occupational History  . Occupation: Geophysicist/field seismologist  Tobacco Use  . Smoking status: Former Smoker    Packs/day: 1.00    Years: 5.00    Pack years: 5.00    Types: Cigarettes    Quit date: 11/30/1981    Years since quitting: 38.6  . Smokeless tobacco: Never  Used  Vaping Use  . Vaping Use: Never used  Substance and Sexual Activity  . Alcohol use: No    Alcohol/week: 0.0 standard drinks    Comment: hx abuse -- quit:  1963  . 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 Sexual activity: Not on file  Other Topics Concern  . Not on file  Social History Narrative   Patient lives at home with his spouse.   Caffeine Use:  Tea, lots   Social Determinants of Health   Financial Resource Strain: Not on file  Food Insecurity: Not on file  Transportation Needs: Not on file  Physical Activity: Not on file  Stress: Not  on file  Social Connections: Not on file  Intimate Partner Violence: Not on file     Review of Systems: General: negative for chills, fever, night sweats or weight changes.  Cardiovascular: negative for chest pain, dyspnea on exertion, edema, orthopnea, palpitations, paroxysmal nocturnal dyspnea or shortness of breath Dermatological: negative for rash Respiratory: negative for cough or wheezing Urologic: negative for hematuria Abdominal: negative for nausea, vomiting, diarrhea, bright red blood per rectum, melena, or hematemesis Neurologic: negative for visual changes, syncope, or dizziness All other systems reviewed and are otherwise negative except as noted above.    Blood pressure 100/78, pulse 65, height 5\' 11"  (1.803 m), weight 162 lb (73.5 kg).  General appearance: alert and no distress Neck: no adenopathy, no carotid bruit, no JVD, supple, symmetrical, trachea midline and thyroid not enlarged, symmetric, no tenderness/mass/nodules Lungs: clear to auscultation bilaterally Heart: regular rate and rhythm, S1, S2 normal, no murmur, click, rub or gallop Extremities: extremities normal, atraumatic, no cyanosis or edema Pulses: 2+ and symmetric Skin: Skin color, texture, turgor normal. No rashes or lesions Neurologic: Alert and oriented X 3, normal strength and tone. Normal symmetric reflexes. Normal coordination and gait  EKG not performed today  ASSESSMENT AND PLAN:   HYPERTENSION, SEVERE History of hypertension on amlodipine and metoprolol.  Blood pressure this morning is 100/78.  Earlier this morning it was 80/50 the patient was dizzy and somnolent.  Its gradually increased.  I am going to stop the amlodipine, have him keep a blood pressure log for 30 days and see a Pharm.D. after that for further evaluation.  Atypical chest pain History of atypical chest pain with negative Myoview 06/12/2017.  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 Lovena Hamilton 11/15/2018.  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  His wound is healed.  He feels clinically improved.      Dustin Harp MD FACP,FACC,FAHA, [facility] 07/24/2020 10:59 AM

## 2020-07-24 NOTE — Assessment & Plan Note (Signed)
History of atypical chest pain with negative Myoview 06/12/2017.  This note this is no longer a clinical problem

## 2020-07-24 NOTE — Assessment & Plan Note (Signed)
History of STD ablation by Dr. Lovena Le 11/15/2018.  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  His wound is healed.  He feels clinically improved.

## 2020-07-27 ENCOUNTER — Telehealth: Payer: Self-pay | Admitting: Cardiovascular Disease

## 2020-07-27 NOTE — Telephone Encounter (Signed)
STAT if patient feels like he/she is going to faint   1) Are you dizzy now?  Not now, all morning he was  2) Do you feel faint or have you passed out?  He felt like he was going to faint*  3) Do you have any other symptoms? no  4) Have you checked your HR and BP (record if available)? 171/90, 171/98, 150/98 sitting standing it was 161/106

## 2020-07-27 NOTE — Telephone Encounter (Signed)
Returned call to patient's wife no answer.Left message to call back.

## 2020-07-27 NOTE — Telephone Encounter (Signed)
Spoke to patient's wife she stated husband's B/P has been elevated since he stopped taking Amlodipine.Readings listed below.Stated he use to take 2.5 mg twice a day.Advised to restart Amlodipine 2.5 mg daily in am.Advised to continue to monitor B/P and call back if B/P elevated.Advised to keep appointment with HTN clinic 3/24 at 3:00 pm.Bring B/P readings to appointment.Advised I will make Dr.Berry aware.

## 2020-07-27 NOTE — Addendum Note (Signed)
Addended by: Kathyrn Lass on: 07/27/2020 05:18 PM   Modules accepted: Orders

## 2020-07-30 DIAGNOSIS — H401122 Primary open-angle glaucoma, left eye, moderate stage: Secondary | ICD-10-CM | POA: Diagnosis not present

## 2020-07-30 DIAGNOSIS — H2511 Age-related nuclear cataract, right eye: Secondary | ICD-10-CM | POA: Diagnosis not present

## 2020-07-30 DIAGNOSIS — Z961 Presence of intraocular lens: Secondary | ICD-10-CM | POA: Diagnosis not present

## 2020-08-13 ENCOUNTER — Telehealth: Payer: Self-pay | Admitting: Diagnostic Neuroimaging

## 2020-08-13 NOTE — Telephone Encounter (Signed)
Pt.'s wife [name] is on [place]. She states that husband has been having spacing out spells the past 2-3 days. Please advise.

## 2020-08-13 NOTE — Telephone Encounter (Signed)
Called wife, [name] who stated patient goes into trance, stares about 5 minutes, is out of it, may mumble or answer. He is having these spells back to back.  He complains of dizziness.  No recent illness or missed medication doses. She stated he was hardly able to get out of bed yesterday due to spells. We rescheduled his FU for tomorrow, advised they arrive 30 minutes early, bring new insurance cards. Wife verbalized understanding, appreciation.

## 2020-08-14 ENCOUNTER — Encounter: Payer: Self-pay | Admitting: Diagnostic Neuroimaging

## 2020-08-14 ENCOUNTER — Ambulatory Visit: Payer: Medicare PPO | Admitting: Diagnostic Neuroimaging

## 2020-08-14 VITALS — BP 124/80 | HR 76 | Ht 71.0 in | Wt 162.0 lb

## 2020-08-14 DIAGNOSIS — R4689 Other symptoms and signs involving appearance and behavior: Secondary | ICD-10-CM | POA: Diagnosis not present

## 2020-08-14 DIAGNOSIS — F0390 Unspecified dementia without behavioral disturbance: Secondary | ICD-10-CM | POA: Diagnosis not present

## 2020-08-14 DIAGNOSIS — F03A0 Unspecified dementia, mild, without behavioral disturbance, psychotic disturbance, mood disturbance, and anxiety: Secondary | ICD-10-CM

## 2020-08-14 NOTE — Progress Notes (Signed)
Per wife's request copy of today's office note faxed to PCP, Dr Enrique Sack, [facility].

## 2020-08-14 NOTE — Progress Notes (Signed)
[facility]  PATIENT: Dustin Hamilton DOB: 05/19/1941  REFERRING CLINICIAN: [facility]  HISTORY FROM: patient and wife  REASON FOR VISIT: FOLLOW UP    HISTORICAL  CHIEF COMPLAINT:  Chief Complaint  Patient presents with  . Spells of abnormal behavior    Rm 6 early FU requested by wife, [name] for frequent starring spells MMSE 25    HISTORY OF PRESENT ILLNESS:   UPDATE (08/14/20, VRP): Since last visit, had VEEG in Dec 2021, and multiple spells were captured, without EEG correlation. 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 Having 2-3 spells per day. No major changes in BP.    UPDATE (05/14/20, VRP): Since last visit, admitted for syncope (Dec 2021). EEG showed some slowing, but no epileptiform discharges. Has continued to have mild dizzy spells without LOC. During our evaluation patient had a witnessed event; he said "I feel it coming on", then he leaned over, said "oh no; oh no", grabbed his walker handled, breathed heavily for 1-2 minutes, could squeeze my hand on command, but not could not say his name initially, but then could speak later on. Patient was amnestic to the spell. This is similar to events witnessed by wife at home (~3-4 per month; zone out; not able to speak).   UPDATE (03/14/19, VRP): Since last visit, doing about the same. Symptoms are stable. Severity is moderate. No alleviating or aggravating factors. Tolerating meds. Memory stable. Balance is poor.   UPDATE (09/06/18, VRP): Since last visit, doing well until 1 weeks ago; patient woke up with new onset of slurred speech, left arm weakness, left arm numbness, trouble walking.  Wife called EMS for evaluation.  By the time they arrived symptoms were starting to improve.  He was able to stand and walk.  His blood pressure was 150s over 80s.  He did not have any fever.  Due to current virus pandemic, mild symptoms, rapidly improving  symptoms, apparently EMS told patient that it would be safer if he stays at home, although I cannot verify this information.  Since that time symptoms have significantly improved.  His slurred speech has improved.  He still has weakness and numbness of his left arm.  His balance is unsteady but slightly improving.  He is tolerating his medications.  Memory stable.  PRIOR HPI 80 year old male here for evaluation of memory loss.  Patient has had at least 3 to 4 years of gradual onset progressive short-term memory loss, confusion, cognitive decline.  He previously diagnosed with vascular dementia.  Past 2 years patient has had intermittent staring spells, zoning out spells, 3-4 times per month.  Sometimes associated with lightheadedness, dizziness and presyncope symptoms.  One time patient went to the hospital for evaluation and was found to have hypotension.  His blood pressure medicines have been adjusted and blood pressure has been improved since August 2019 and patient has not had any of these staring spells since that time.   REVIEW OF SYSTEMS: Full 14 system review of systems performed and negative with exception of: as per HPI.    ALLERGIES: Allergies  Allergen Reactions  . Penicillins Hives    HOME MEDICATIONS: Outpatient Medications Prior to Visit  Medication Sig Dispense Refill  . allopurinol (ZYLOPRIM) 100 MG tablet Take 100 mg by mouth daily.    Marland Kitchen amLODipine (NORVASC) 2.5 MG tablet Take 1 tablet (2.5 mg total) by mouth daily. 180 tablet 3  . aspirin EC 81 MG EC tablet Take  1 tablet (81 mg total) by mouth daily. Swallow whole. 30 tablet 11  . brimonidine (ALPHAGAN) 0.2 % ophthalmic solution Place 1 drop into both eyes 2 (two) times daily.    Marland Kitchen dutasteride (AVODART) 0.5 MG capsule Take 0.5 mg by mouth daily.    Marland Kitchen latanoprost (XALATAN) 0.005 % ophthalmic solution Place 1 drop into both eyes at bedtime.     . memantine (NAMENDA) 5 MG tablet Take 17.5 mg by mouth at bedtime.     .  metoprolol succinate (TOPROL-XL) 25 MG 24 hr tablet Take 0.5 tablets (12.5 mg total) by mouth at bedtime. 30 tablet 1  . tamsulosin (FLOMAX) 0.4 MG CAPS capsule Take 0.4 mg by mouth at bedtime.     . DUREZOL 0.05 % EMUL Place 1 drop into the right eye 3 (three) times daily.     No facility-administered medications prior to visit.    PAST MEDICAL HISTORY: Past Medical History:  Diagnosis Date  . BPH (benign prostatic hyperplasia)   . Cerebrovascular disease   . Congenital anomaly of diaphragm   . Elevated PSA   . Glaucoma, both eyes   . Hemorrhoid   . Hepatitis B surface antigen positive    02-20-2011  . History of adenomatous polyp of colon    2007, 2009 and 2013  tubular adenoma's  . History of alcohol abuse    quit 1963  . History of cerebral parenchymal hemorrhage    01/ 2006  left occiptial lobe related to hypertensive crisis  . History of CVA (cerebrovascular accident)    09-12-2012  left hippocampus/ amygdala junction and per MRI old white matter infarcts--  per pt residual short- term memory issues  . History of fatty infiltration of liver hx visit's at [facility] , last visit 05/ 2014   elvated LFT's ,  via liver bx 2004 related to hx alcohol and drug abuse (quit 1964)  . 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 HTN (hypertension)   . Renal cyst, left   . Stroke ([place])    hx of 3 strokes in past   . 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 Urethral lesion    urethral mass    PAST SURGICAL HISTORY: Past Surgical History:  Procedure Laterality Date  . CARDIOVASCULAR STRESS TEST  05/05/2007   normal nuclear study w/ no ischemia/  normal LV fucntion and wall motion , ef60%  . COLONOSCOPY  last one 04-06-2012  . CYSTO/  LEFT RETROGRADE PYELOGRAM/ CYTOLOGY WASHINGS/  URETEROSCOPY  03/05/2000  . INGUINAL HERNIA REPAIR Bilateral 1965 and 1980's  . LAPAROSCOPIC INGUINAL HERNIA WITH UMBILICAL HERNIA Right  89/38/1017  . LIVER BIOPSY  1980's and 2004  . 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   Procedure: PACEMAKER IMPLANT;  Surgeon: Evans Lance, MD;  Location: [facility];  Service: Cardiovascular;  Laterality: N/A;  . SVT ABLATION N/A 11/15/2018   Procedure: SVT ABLATION;  Surgeon: Evans Lance, MD;  Location: [facility];  Service: Cardiovascular;  Laterality: N/A;  . TRANSTHORACIC ECHOCARDIOGRAM  09/13/2012   moderate LVH,  ef 60-65%/    . TRANSURETHRAL RESECTION OF BLADDER TUMOR N/A 08/11/2016   Procedure: TRANSURETHRAL RESECTION OF BLADDER TUMOR (TURBT);  Surgeon: Cleon Gustin, MD;  Location: [facility];  Service: Urology;  Laterality: N/A;    FAMILY HISTORY: Family History  Problem Relation Age of Onset  .  Rheum arthritis Mother   . Diabetes Mother   . Stroke Mother   . Heart attack Mother   . Kidney failure Mother   . Heart attack Father   . Heart disease Maternal Grandmother   . Rheum arthritis Maternal Grandmother   . Diabetes Maternal Grandmother   . Stroke Maternal Grandmother   . Colon cancer Neg Hx     SOCIAL HISTORY: Social History   Socioeconomic History  . Marital status: Married    Spouse name: [name]  . Number of children: 1  . Years of education: College  . Highest education level: Not on file  Occupational History  . Occupation: Geophysicist/field seismologist  Tobacco Use  . Smoking status: Former Smoker    Packs/day: 1.00    Years: 5.00    Pack years: 5.00    Types: Cigarettes    Quit date: 11/30/1981    Years since quitting: 38.7  . Smokeless tobacco: Never Used  Vaping Use  . Vaping Use: Never used  Substance and Sexual Activity  . Alcohol use: No    Alcohol/week: 0.0 standard drinks    Comment: hx abuse -- quit:  1963  . 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 Sexual activity: Not on file  Other Topics Concern  . Not on file  Social History Narrative   Patient lives at home with his spouse.    Caffeine Use:  Tea, lots   Social Determinants of Health   Financial Resource Strain: Not on file  Food Insecurity: Not on file  Transportation Needs: Not on file  Physical Activity: Not on file  Stress: Not on file  Social Connections: Not on file  Intimate Partner Violence: Not on file     PHYSICAL EXAM  GENERAL EXAM/CONSTITUTIONAL: Vitals:  Vitals:   08/14/20 1003  BP: 124/80  Pulse: 76  Weight: 162 lb (73.5 kg)  Height: 5\' 11"  (1.803 m)   Body mass index is 22.59 kg/m. Wt Readings from Last 3 Encounters:  08/14/20 162 lb (73.5 kg)  07/24/20 162 lb (73.5 kg)  06/27/20 164 lb (74.4 kg)    Patient is in no distress; well developed, nourished and groomed; neck is supple  CARDIOVASCULAR:  Examination of carotid arteries is normal; no carotid bruits  Regular rate and rhythm, no murmurs  Examination of peripheral vascular system by observation and palpation is normal  EYES:  Ophthalmoscopic exam of optic discs and posterior segments is normal; no papilledema or hemorrhages No exam data present  MUSCULOSKELETAL:  Gait, strength, tone, movements noted in Neurologic exam below  NEUROLOGIC: MENTAL STATUS:  MMSE - [place] Exam 08/14/2020 05/14/2020 03/14/2019  Orientation to time 4 5 5   Orientation to Place 4 4 4   Registration 3 3 3   Attention/ Calculation 3 3 4   Recall 3 3 3   Language- name 2 objects 2 2 2   Language- repeat 1 1 1   Language- follow 3 step command 3 3 3   Language- read & follow direction 1 1 1   Write a sentence 1 1 1   Copy design 0 0 1  Total score 25 26 28     awake, alert, oriented to person, place and time  recent and remote memory intact  normal attention and concentration  language fluent, comprehension intact, naming intact  fund of knowledge appropriate  CRANIAL NERVE:   2nd - no papilledema on fundoscopic exam  2nd, 3rd, 4th, 6th - pupils equal and  reactive to light, visual fields full to confrontation,  extraocular muscles intact, no nystagmus  5th - facial sensation symmetric  7th - facial strength symmetric  8th - hearing intact  9th - palate elevates symmetrically, uvula midline  11th - shoulder shrug symmetric  12th - tongue protrusion midline  MOTOR:   normal bulk and tone, full strength in the BUE, BLE  SENSORY:   normal and symmetric to light touch, temperature, vibration  COORDINATION:   finger-nose-finger, fine finger movements normal  REFLEXES:   deep tendon reflexes present and symmetric  GAIT/STATION:   narrow based gait      DIAGNOSTIC DATA (LABS, IMAGING, TESTING) - I reviewed patient records, labs, notes, testing and imaging myself where available.  Lab Results  Component Value Date   WBC 7.6 06/21/2020   HGB 16.1 06/21/2020   HCT 48.6 06/21/2020   MCV 96.6 06/21/2020   PLT 150 06/21/2020      Component Value Date/Time   NA 144 06/21/2020 1656   NA 143 11/12/2018 0950   K 4.0 06/21/2020 1656   CL 115 (H) 06/21/2020 1656   CO2 16 (L) 06/21/2020 1656   GLUCOSE 90 06/21/2020 1656   BUN 20 06/21/2020 1656   BUN 15 11/12/2018 0950   CREATININE 1.38 (H) 06/21/2020 1656   CALCIUM 9.6 06/21/2020 1656   PROT 6.9 06/21/2020 1656   ALBUMIN 3.5 06/21/2020 1656   AST 19 06/21/2020 1656   ALT 17 06/21/2020 1656   ALKPHOS 65 06/21/2020 1656   BILITOT 0.5 06/21/2020 1656   GFRNONAA 52 (L) 06/21/2020 1656   GFRAA 60 (L) 02/28/2020 1152   Lab Results  Component Value Date   CHOL 123 09/13/2012   HDL 25 (L) 09/13/2012   LDLCALC 76 09/13/2012   TRIG 112 09/13/2012   CHOLHDL 4.9 09/13/2012   Lab Results  Component Value Date   HGBA1C 6.2 (H) 09/13/2012   Lab Results  Component Value Date   FXTKWIOX73 532 06/28/2013   Lab Results  Component Value Date   TSH 1.050 05/04/2020    10/16/16 MRI HEAD [I reviewed images myself and agree with interpretation. Moderate ventriculomegaly and atrophy.  -VRP]  1. No acute intracranial infarct or  other process identified. 2. Innumerable chronic micro hemorrhages scattered throughout the brain, most likely related to chronic underlying hypertension given patient history. 3. Stable atrophy with moderate chronic microvascular ischemic disease.  10/16/16 MRA HEAD [I reviewed images myself and agree with interpretation. -VRP]  Negative intracranial MRA. No large or proximal arterial branch occlusion. No high-grade or correctable stenosis.  10/16/16 MRA NECK [I reviewed images myself and agree with interpretation. -VRP]  1. Negative MRA of the neck. No hemodynamically significant or critical stenosis identified within the neck.  2. 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  01/23/17 MRI brain - No acute treatment pathology identified, specifically no acute infarct. - Remote lacunar infarcts in the bilateral frontal lobe centrum semiovale. - Moderate chronic small vessel ischemic change. - Multiple scattered foci of susceptibility most abundant within the peripheral cerebrum and cerebellum which may reflect amyloid angiopathy pattern.   07/08/17 CT head [I reviewed images myself and agree with interpretation. -VRP]  1. No CT evidence for acute intracranial abnormality. 2. Atrophy and small vessel ischemic changes of the white matter.  06/09/17 TTE - Left ventricle: The cavity size was normal. Systolic function was   normal. The estimated ejection fraction was in the range of 60%   to 65%. Wall  motion was normal; there were no regional wall   motion abnormalities. There was an increased relative   contribution of atrial contraction to ventricular filling.   Doppler parameters are consistent with abnormal left ventricular   relaxation (grade 1 diastolic dysfunction). Doppler parameters   are consistent with high ventricular filling pressure. - Mitral valve: There was trivial regurgitation. - Pulmonary arteries: Systolic pressure could not be accurately    estimated.  09/07/18 carotid u/s Right Carotid: There was no evidence of thrombus, dissection, atherosclerotic                plaque or stenosis in the cervical carotid system. Left Carotid: There is no evidence of stenosis in the left ICA. The extracranial               vessels were near-normal with only minimal wall thickening or               plaque. Vertebrals:  Bilateral vertebral arteries demonstrate antegrade flow. Subclavians: Normal flow hemodynamics were seen in bilateral subclavian              arteries.  09/24/18 MRI brain - Acute nonhemorrhagic white matter infarct, RIGHT centrum semiovale. - Atrophy and small vessel disease.  11/15/18 EP study 1. Sinus rhythm upon presentation.  2. 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 Successful radiofrequency modification of the slow AV nodal pathway  4. No inducible arrhythmias following ablation.  5. No early apparent complications.   05/03/20 EEG - This study is suggestive of non-specific cortical dysfuntion in right temporal region. No seizures or epileptiform discharges were seen throughout the recording.  05/03/20 MRI brain - Negative for acute infarct - Atrophy and moderate chronic ischemic change. - Numerous foci of chronic microhemorrhage throughout the cerebellum and cerebrum bilaterally. This is unchanged from the prior study. Differential includes uncontrolled hypertension and cerebral amyloid.    ASSESSMENT AND PLAN  80 y.o. year old male here with gradual onset progressive short-term memory loss confusion and decline in ADLs, consistent with mild dementia.  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  Abnormal spells of staring and zoning out are likely presyncope and hypotension related.     Dx:  1. Mild dementia ([place])   2. Spell of abnormal behavior       PLAN:  ABNORMAL SPELL / CONFUSION (witnessed event in clinic 05/14/20 and 08/14/20; no EEG correlation on VEEG in Dec 2021; epileptic seizures ruled out; could be related to transient hypotension vs other non-epileptic spells) - monitor symptoms and BP  RECURRENT DIZZINESS / SYNCOPE / PRESYNCOPE - continue medical mgmt of BP, afib per PCP and cardiology  DEMENTIA (VASCULAR + neurodegenerative / alzheimer's) - continue memantine (previously tried donepezil) - safety / supervision issues reviewed - NO driving; caution with finances and medication mgmt  LEFT ARM NUMBNESS / WEAKNESS / SLURRED SPEECH (small vessel stroke; improved) - continue plavix and BP control - follow up with PCP re: lipid and diabetes screening - stroke recovery exercises reviewed  Return for return to PCP.    Penni Bombard, MD 9/67/8938, 10:17 AM Certified in Neurology, Neurophysiology and Neuroimaging  [facility] [address], [place], [place] [address] [phone]

## 2020-08-23 ENCOUNTER — Other Ambulatory Visit: Payer: Self-pay

## 2020-08-23 ENCOUNTER — Ambulatory Visit (INDEPENDENT_AMBULATORY_CARE_PROVIDER_SITE_OTHER): Payer: Medicare PPO | Admitting: Pharmacist Clinician (PhC)/ Clinical Pharmacy Specialist

## 2020-08-23 DIAGNOSIS — I10 Essential (primary) hypertension: Secondary | ICD-10-CM | POA: Diagnosis not present

## 2020-08-23 NOTE — Patient Instructions (Signed)
Return for a a follow up appointment Friday April 29 at 2 pm  Check your blood pressure at home daily and keep record of the readings.  Take your BP meds as follows:  Increase the amlodipine to 1 tablet twice daily  Continue with all other medications  Talk to urology about possibly stopping the tamsulosin due to dizziness.   Bring all of your meds, your BP cuff and your record of home blood pressures to your next appointment.  Exercise as you're able, try to walk approximately 30 minutes per day.  Keep salt intake to a minimum, especially watch canned and prepared boxed foods.  Eat more fresh fruits and vegetables and fewer canned items.  Avoid eating in fast food restaurants.    HOW TO TAKE YOUR BLOOD PRESSURE: . Rest 5 minutes before taking your blood pressure. .  Don't smoke or drink caffeinated beverages for at least 30 minutes before. . Take your blood pressure before (not after) you eat. . Sit comfortably with your back supported and both feet on the floor (don't cross your legs). . Elevate your arm to heart level on a table or a desk. . Use the proper sized cuff. It should fit smoothly and snugly around your bare upper arm. There should be enough room to slip a fingertip under the cuff. The bottom edge of the cuff should be 1 inch above the crease of the elbow. . Ideally, take 3 measurements at one sitting and record the average.

## 2020-08-23 NOTE — Progress Notes (Signed)
08/29/2020 Dustin Hamilton 1941-01-30 [id number]   HPI:  Dustin Hamilton is a 80 y.o. male patient of Dr Gwenlyn Found, who presents today for hypertension clinic evaluation.  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 When he saw Dr. Gwenlyn Found last month and his pressure was noted to be just 100/78.  Dr. Gwenlyn Found had patient discontinue his amlodipine (he was taking 2.5 mg bid).  He returns today for follow up.  Apparently a few days after stopping the amlodipine it was noted by his PT that his pressure was elevated at 171/90.  They contacted our office and it was recommended that he go back on amlodipine 2.5 mg daily.  His biggest complaint is that he gets dizziness on a fairly regular basis.  He notes that it can occur at any time, not just with positional changes.  Had somewhat limited his mobility, despite working with PT for balance/strength.     Reviewed all medications with patient in the office today and it is noteworthy that he takes tamsulosin, which does have a fairly high rate of dizziness for side effect.    Blood Pressure Goal:  130/80  Current Medications: metoprolol succ 12.5 mg qhs  Family Hx: mother with MI and stroke, died about 83; don't know father; no whole siblings; son with hypertension  Social Hx: no, no, drinks teas (hot and sweet) on occasion  Diet: eats out regularly, knows he needs to watch sodium, but eats a mix of fast food, sandwich shop and sit down restaurants.    Exercise: physical therapy - to help with balance (once weekly), with home exercises  Home BP readings: home cuff, about 80 year old, from [place]  24 readings, average 148/87  HR 63  Intolerances: dizziness with sildenafil  Labs: 1/22:  Na 144, K 4.0, Glu 90, BUN 20, SCr 1.38, GFR 52   Wt Readings from Last 3 Encounters:  08/23/20 162 lb (73.5 kg)  08/14/20 162 lb (73.5 kg)  07/24/20 162 lb (73.5 kg)   BP  Readings from Last 3 Encounters:  08/23/20 132/88  08/14/20 124/80  07/24/20 100/78   Pulse Readings from Last 3 Encounters:  08/23/20 69  08/14/20 76  07/24/20 65    Current Outpatient Medications  Medication Sig Dispense Refill  . allopurinol (ZYLOPRIM) 100 MG tablet Take 100 mg by mouth daily.    Marland Kitchen amLODipine (NORVASC) 2.5 MG tablet Take 1 tablet (2.5 mg total) by mouth daily. 180 tablet 3  . aspirin EC 81 MG EC tablet Take 1 tablet (81 mg total) by mouth daily. Swallow whole. 30 tablet 11  . brimonidine (ALPHAGAN) 0.2 % ophthalmic solution Place 1 drop into both eyes 2 (two) times daily.    Marland Kitchen dutasteride (AVODART) 0.5 MG capsule Take 0.5 mg by mouth daily.    Marland Kitchen latanoprost (XALATAN) 0.005 % ophthalmic solution Place 1 drop into both eyes at bedtime.     . memantine (NAMENDA) 5 MG tablet Take 17.5 mg by mouth at bedtime.     . metoprolol succinate (TOPROL-XL) 25 MG 24 hr tablet Take 0.5 tablets (12.5 mg total) by mouth at bedtime. 30 tablet 1  . tamsulosin (FLOMAX) 0.4 MG CAPS capsule Take 0.4 mg by mouth at bedtime.      No current facility-administered medications for this visit.    Allergies  Allergen Reactions  . Aricept [Donepezil]     dizziness  . Penicillins  Hives  . Viagra [Sildenafil]     dizziness    Past Medical History:  Diagnosis Date  . BPH (benign prostatic hyperplasia)   . Cerebrovascular disease   . Congenital anomaly of diaphragm   . Elevated PSA   . Glaucoma, both eyes   . Hemorrhoid   . Hepatitis B surface antigen positive    02-20-2011  . History of adenomatous polyp of colon    2007, 2009 and 2013  tubular adenoma's  . History of alcohol abuse    quit 1963  . History of cerebral parenchymal hemorrhage    01/ 2006  left occiptial lobe related to hypertensive crisis  . History of CVA (cerebrovascular accident)    09-12-2012  left hippocampus/ amygdala junction and per MRI old white matter infarcts--  per pt residual short- term memory issues   . History of fatty infiltration of liver hx visit's at [facility] , last visit 05/ 2014   elvated LFT's ,  via liver bx 2004 related to hx alcohol and drug abuse (quit 1964)  . 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 HTN (hypertension)   . Renal cyst, left   . Stroke ([place])    hx of 3 strokes in past   . 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 Urethral lesion    urethral mass    Blood pressure 132/88, pulse 69, resp. rate 15, height 5\' 11"  (1.803 m), weight 162 lb (73.5 kg), SpO2 96 %.  HYPERTENSION, SEVERE Patient with essential hypertension, also noted to have occasional episodes of hypotension.  Will have him go back to the full dose of amlodipine (5 mg) for now, but encouraged him to talk to urology about potentially stopping tamsulosin for a week or two to determine if this is contributing to his dizziness.   He was asked to continue checking home BP readings for the next month and return, with that cuff, in one month for follow up.  We can then determine the accuracy of the cuff and better decide a hypertension medication regimen.   Tommy Medal PharmD CPP [facility] [address] [place], [place] [address] [phone]

## 2020-08-28 ENCOUNTER — Ambulatory Visit (INDEPENDENT_AMBULATORY_CARE_PROVIDER_SITE_OTHER): Payer: Medicare PPO

## 2020-08-28 DIAGNOSIS — I442 Atrioventricular block, complete: Secondary | ICD-10-CM

## 2020-08-28 LAB — CUP PACEART REMOTE DEVICE CHECK
Date Time Interrogation Session: 20220328145316
Implantable Lead Implant Date: 20211227
Implantable Lead Implant Date: 20211227
Implantable Lead Location: 753859
Implantable Lead Location: 753860
Implantable Lead Model: 377
Implantable Lead Model: 377
Implantable Lead Serial Number: 8000018960
Implantable Lead Serial Number: 8000042643
Implantable Pulse Generator Implant Date: 20211227
Pulse Gen Model: 407145
Pulse Gen Serial Number: 69915927

## 2020-08-29 NOTE — Assessment & Plan Note (Signed)
Patient with essential hypertension, also noted to have occasional episodes of hypotension.  Will have him go back to the full dose of amlodipine (5 mg) for now, but encouraged him to talk to urology about potentially stopping tamsulosin for a week or two to determine if this is contributing to his dizziness.   He was asked to continue checking home BP readings for the next month and return, with that cuff, in one month for follow up.  We can then determine the accuracy of the cuff and better decide a hypertension medication regimen.

## 2020-09-04 ENCOUNTER — Ambulatory Visit (INDEPENDENT_AMBULATORY_CARE_PROVIDER_SITE_OTHER): Payer: Medicare PPO | Admitting: Internal Medicine

## 2020-09-04 ENCOUNTER — Other Ambulatory Visit: Payer: Self-pay

## 2020-09-04 ENCOUNTER — Encounter: Payer: Self-pay | Admitting: Internal Medicine

## 2020-09-04 VITALS — BP 130/102 | HR 71 | Ht 71.0 in | Wt 162.0 lb

## 2020-09-04 DIAGNOSIS — I471 Supraventricular tachycardia: Secondary | ICD-10-CM | POA: Diagnosis not present

## 2020-09-04 DIAGNOSIS — I442 Atrioventricular block, complete: Secondary | ICD-10-CM

## 2020-09-04 DIAGNOSIS — Z950 Presence of cardiac pacemaker: Secondary | ICD-10-CM

## 2020-09-04 NOTE — Progress Notes (Signed)
HPI [name] returns today for followup. He is a pleasant 80 yo man with a h/o SVT who underwent EP study and catheter ablation of AVNRT. He has undergone a PPM just over 3 months ago due to intermittent heart block. In the interim,  He has done well except he has occaisional spells where he becomes less responsive. His bp is a little low. The spells last minutes. Allergies  Allergen Reactions  . Aricept [Donepezil]     dizziness  . Penicillins Hives  . Viagra [Sildenafil]     dizziness     Current Outpatient Medications  Medication Sig Dispense Refill  . allopurinol (ZYLOPRIM) 100 MG tablet Take 100 mg by mouth daily.    Marland Kitchen amLODipine (NORVASC) 2.5 MG tablet Take 1 tablet (2.5 mg total) by mouth daily. 180 tablet 3  . aspirin EC 81 MG EC tablet Take 1 tablet (81 mg total) by mouth daily. Swallow whole. 30 tablet 11  . brimonidine (ALPHAGAN) 0.2 % ophthalmic solution Place 1 drop into both eyes 2 (two) times daily.    Marland Kitchen dutasteride (AVODART) 0.5 MG capsule Take 0.5 mg by mouth daily.    Marland Kitchen latanoprost (XALATAN) 0.005 % ophthalmic solution Place 1 drop into both eyes at bedtime.     . memantine (NAMENDA) 5 MG tablet Take 17.5 mg by mouth at bedtime.     . metoprolol succinate (TOPROL-XL) 25 MG 24 hr tablet Take 0.5 tablets (12.5 mg total) by mouth at bedtime. 30 tablet 1  . tamsulosin (FLOMAX) 0.4 MG CAPS capsule Take 0.4 mg by mouth at bedtime.      No current facility-administered medications for this visit.     Past Medical History:  Diagnosis Date  . BPH (benign prostatic hyperplasia)   . Cerebrovascular disease   . Congenital anomaly of diaphragm   . Elevated PSA   . Glaucoma, both eyes   . Hemorrhoid   . Hepatitis B surface antigen positive    02-20-2011  . History of adenomatous polyp of colon    2007, 2009 and 2013  tubular adenoma's  . History of alcohol abuse    quit 1963  . History of cerebral parenchymal hemorrhage    01/ 2006  left occiptial lobe  related to hypertensive crisis  . History of CVA (cerebrovascular accident)    09-12-2012  left hippocampus/ amygdala junction and per MRI old white matter infarcts--  per pt residual short- term memory issues  . History of fatty infiltration of liver hx visit's at [facility] , last visit 05/ 2014   elvated LFT's ,  via liver bx 2004 related to hx alcohol and drug abuse (quit 1964)  . 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 HTN (hypertension)   . Renal cyst, left   . Stroke ([place])    hx of 3 strokes in past   . 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 Urethral lesion    urethral mass    ROS:   All systems reviewed and negative except as noted in the HPI.   Past Surgical History:  Procedure Laterality Date  . CARDIOVASCULAR STRESS TEST  05/05/2007   normal nuclear study w/ no ischemia/  normal LV fucntion and wall motion , ef60%  . COLONOSCOPY  last one 04-06-2012  . CYSTO/  LEFT RETROGRADE PYELOGRAM/ CYTOLOGY WASHINGS/  URETEROSCOPY  03/05/2000  . INGUINAL HERNIA  REPAIR Bilateral 1965 and 1980's  . LAPAROSCOPIC INGUINAL HERNIA WITH UMBILICAL HERNIA Right 54/27/0623  . LIVER BIOPSY  1980's and 2004  . 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   Procedure: PACEMAKER IMPLANT;  Surgeon: Evans Lance, MD;  Location: [facility];  Service: Cardiovascular;  Laterality: N/A;  . SVT ABLATION N/A 11/15/2018   Procedure: SVT ABLATION;  Surgeon: Evans Lance, MD;  Location: [facility];  Service: Cardiovascular;  Laterality: N/A;  . TRANSTHORACIC ECHOCARDIOGRAM  09/13/2012   moderate LVH,  ef 60-65%/    . TRANSURETHRAL RESECTION OF BLADDER TUMOR N/A 08/11/2016   Procedure: TRANSURETHRAL RESECTION OF BLADDER TUMOR (TURBT);  Surgeon: Cleon Gustin, MD;  Location: [facility];  Service: Urology;  Laterality: N/A;     Family History  Problem Relation Age of Onset  . Rheum arthritis Mother   .  Diabetes Mother   . Stroke Mother   . Heart attack Mother   . Kidney failure Mother   . Heart attack Father   . Heart disease Maternal Grandmother   . Rheum arthritis Maternal Grandmother   . Diabetes Maternal Grandmother   . Stroke Maternal Grandmother   . Colon cancer Neg Hx      Social History   Socioeconomic History  . Marital status: Married    Spouse name: Dustin Hamilton  . Number of children: 1  . Years of education: College  . Highest education level: Not on file  Occupational History  . Occupation: Geophysicist/field seismologist  Tobacco Use  . Smoking status: Former Smoker    Packs/day: 1.00    Years: 5.00    Pack years: 5.00    Types: Cigarettes    Quit date: 11/30/1981    Years since quitting: 38.7  . Smokeless tobacco: Never Used  Vaping Use  . Vaping Use: Never used  Substance and Sexual Activity  . Alcohol use: No    Alcohol/week: 0.0 standard drinks    Comment: hx abuse -- quit:  1963  . 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 Sexual activity: Not on file  Other Topics Concern  . Not on file  Social History Narrative   Patient lives at home with his spouse.   Caffeine Use:  Tea, lots   Social Determinants of Health   Financial Resource Strain: Not on file  Food Insecurity: Not on file  Transportation Needs: Not on file  Physical Activity: Not on file  Stress: Not on file  Social Connections: Not on file  Intimate Partner Violence: Not on file     BP (!) 130/102   Pulse 71   Ht 5\' 11"  (1.803 m)   Wt 162 lb (73.5 kg)   SpO2 96%   BMI 22.59 kg/m   Physical Exam:  Well appearing NAD HEENT: Unremarkable Neck:  No JVD, no thyromegally Lymphatics:  No adenopathy Back:  No CVA tenderness Lungs:  Clear with no wheezes HEART:  Regular rate rhythm, no murmurs, no rubs, no clicks Abd:  soft, positive bowel sounds, no organomegally, no rebound, no guarding Ext:  2 plus pulses, no edema, no cyanosis, no clubbing Skin:  No rashes no  nodules Neuro:  CN II through XII intact, motor grossly intact  EKG - nsr with PVC's  DEVICE  Normal device function.  See PaceArt for details.   Assess/Plan: 1. Intermittent CHB - he is stable s/p PPM insertion 2. Syncope - he has  not had any but is still having spells of altered consciousness. I asked his wife to keep checking the bp. If it runs low then he will need to stop the norvasc. 3. SVT - he is s/p catheter ablation. He will continue his beta blocker. His PPM does not demonstrate any SVT. 4. PPM - his Biotronik DDD PPM is working normally. We turned down his outputs.   Carleene Overlie Mario Coronado,MD

## 2020-09-04 NOTE — Patient Instructions (Signed)
Medication Instructions:  Your physician recommends that you continue on your current medications as directed. Please refer to the Current Medication list given to you today.  Labwork: None ordered.  Testing/Procedures: None ordered.  Follow-Up: Your physician wants you to follow-up in: one year with Cristopher Peru, MD or one of the following Advanced Practice Providers on your designated Care Team:    Chanetta Marshall, NP  Tommye Standard, PA-C  Legrand Como "Jonni Sanger" [place], [place]  Remote monitoring is used to monitor your Pacemaker from home. This monitoring reduces the number of office visits required to check your device to one time per year. It allows [place] to keep an eye on the functioning of your device to ensure it is working properly. You are scheduled for a device check from home on 11/27/2020. You may send your transmission at any time that day. If you have a wireless device, the transmission will be sent automatically. After your physician reviews your transmission, you will receive a postcard with your next transmission date.  Any Other Special Instructions Will Be Listed Below (If Applicable).  If you need a refill on your cardiac medications before your next appointment, please call your pharmacy.

## 2020-09-11 ENCOUNTER — Emergency Department (HOSPITAL_COMMUNITY)
Admission: EM | Admit: 2020-09-11 | Discharge: 2020-09-11 | Disposition: A | Discharge status: Home / Self Care | Payer: No Typology Code available for payment source | Attending: Emergency Medicine | Admitting: Emergency Medicine

## 2020-09-11 ENCOUNTER — Other Ambulatory Visit: Payer: Self-pay

## 2020-09-11 DIAGNOSIS — I10 Essential (primary) hypertension: Secondary | ICD-10-CM | POA: Insufficient documentation

## 2020-09-11 DIAGNOSIS — Z7982 Long term (current) use of aspirin: Secondary | ICD-10-CM | POA: Diagnosis not present

## 2020-09-11 DIAGNOSIS — R42 Dizziness and giddiness: Secondary | ICD-10-CM

## 2020-09-11 DIAGNOSIS — R55 Syncope and collapse: Secondary | ICD-10-CM

## 2020-09-11 DIAGNOSIS — Z87891 Personal history of nicotine dependence: Secondary | ICD-10-CM | POA: Diagnosis not present

## 2020-09-11 DIAGNOSIS — Z8673 Personal history of transient ischemic attack (TIA), and cerebral infarction without residual deficits: Secondary | ICD-10-CM | POA: Diagnosis not present

## 2020-09-11 DIAGNOSIS — Z79899 Other long term (current) drug therapy: Secondary | ICD-10-CM | POA: Insufficient documentation

## 2020-09-11 DIAGNOSIS — R531 Weakness: Secondary | ICD-10-CM | POA: Diagnosis not present

## 2020-09-11 DIAGNOSIS — Z950 Presence of cardiac pacemaker: Secondary | ICD-10-CM | POA: Insufficient documentation

## 2020-09-11 LAB — BASIC METABOLIC PANEL
Anion gap: 5 (ref 5–15)
BUN: 14 mg/dL (ref 8–23)
CO2: 29 mmol/L (ref 22–32)
Calcium: 9.8 mg/dL (ref 8.9–10.3)
Chloride: 108 mmol/L (ref 98–111)
Creatinine, Ser: 1.04 mg/dL (ref 0.61–1.24)
GFR, Estimated: >60 mL/min (ref >60)
Glucose, Bld: 92 mg/dL (ref 70–99)
Potassium: 4 mmol/L (ref 3.5–5.1)
Sodium: 142 mmol/L (ref 135–145)

## 2020-09-11 LAB — URINALYSIS, ROUTINE W REFLEX MICROSCOPIC
Bilirubin Urine: NEGATIVE
Glucose, UA: NEGATIVE mg/dL
Ketones, ur: NEGATIVE mg/dL
Nitrite: NEGATIVE
Protein, ur: 30 mg/dL — AB
Specific Gravity, Urine: 1.013 (ref 1.005–1.030)
pH: 6 (ref 5.0–8.0)

## 2020-09-11 LAB — CBC WITH DIFFERENTIAL/PLATELET
Abs Immature Granulocytes: 0.01 10*3/uL (ref 0.00–0.07)
Basophils Absolute: 0 10*3/uL (ref 0.0–0.1)
Basophils Relative: 1 %
Eosinophils Absolute: 0.1 10*3/uL (ref 0.0–0.5)
Eosinophils Relative: 3 %
HCT: 47.9 % (ref 39.0–52.0)
Hemoglobin: 16 g/dL (ref 13.0–17.0)
Immature Granulocytes: 0 %
Lymphocytes Relative: 28 %
Lymphs Abs: 1.2 10*3/uL (ref 0.7–4.0)
MCH: 31.2 pg (ref 26.0–34.0)
MCHC: 33.4 g/dL (ref 30.0–36.0)
MCV: 93.4 fL (ref 80.0–100.0)
Monocytes Absolute: 0.3 10*3/uL (ref 0.1–1.0)
Monocytes Relative: 7 %
Neutro Abs: 2.7 10*3/uL (ref 1.7–7.7)
Neutrophils Relative %: 61 %
Platelets: 132 10*3/uL — ABNORMAL LOW (ref 150–400)
RBC: 5.13 MIL/uL (ref 4.22–5.81)
RDW: 14 % (ref 11.5–15.5)
WBC: 4.5 10*3/uL (ref 4.0–10.5)
nRBC: 0 % (ref 0.0–0.2)

## 2020-09-11 NOTE — Progress Notes (Signed)
Remote pacemaker transmission.   

## 2020-09-11 NOTE — Discharge Instructions (Addendum)
As discussed, all of your labs are reassuring today.  Your pacemaker is working correctly and had no events.  Please call your neurologist and cardiologist today to schedule an appointment for further evaluation.  Continue to drink extra fluids.  Take your medications as prescribed by your providers.  Return to the ER for new or worsening symptoms.

## 2020-09-11 NOTE — ED Provider Notes (Signed)
[facility] EM[facility]ENC[facility] DEPARTMENT Provider Note   CSN: [id number] Arrival date & time: 09/11/20  1141     History Chief Complaint  Patient presents with  . Dizziness    Dustin Hamilton is a 80 y.o. 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 Today patient states he was sitting down about to eat breakfast when he became acutely dizzy which he describes as feeling off balance. No syncopal episode. No prodromal symptoms. He notes during this episode his vision got "foggy" which has completely resolved.  Denies unilateral weakness and speech changes.  Patient denies chest pain and shortness of breath.  Denies abdominal pain, nausea, vomiting, diarrhea.  No fever or chills.  Chart reviewed.  Patient was evaluated by Dr. Wilburn Cornelia with ENT yesterday for the same complaint with unclear etiology.  Dr. Wilburn Cornelia did not believe dizziness related to BPPV, Mnire's disease, or vestibular neuronitis. He recommended continued cardiology and neurology work-up. He notes his dizziness has improved from this morning. Dizziness is not worse with change in position. He had an MRI on 05/03/2020 which was negative for active infarct, but did show atrophy and moderate chronic ischemic changes. No treatment prior to arrival.  No aggravating or alleviating symptoms.  History obtained from patient and past medical records. No interpreter used during encounter.      Past Medical History:  Diagnosis Date  . BPH (benign prostatic hyperplasia)   . Cerebrovascular disease   . Congenital anomaly of diaphragm   . Elevated PSA   . Glaucoma, both eyes   . Hemorrhoid   . Hepatitis B surface antigen positive    02-20-2011  . History of adenomatous polyp of colon    2007, 2009  and 2013  tubular adenoma's  . History of alcohol abuse    quit 1963  . History of cerebral parenchymal hemorrhage    01/ 2006  left occiptial lobe related to hypertensive crisis  . History of CVA (cerebrovascular accident)    09-12-2012  left hippocampus/ amygdala junction and per MRI old white matter infarcts--  per pt residual short- term memory issues  . History of fatty infiltration of liver hx visit's at [facility] , last visit 05/ 2014   elvated LFT's ,  via liver bx 2004 related to hx alcohol and drug abuse (quit 1964)  . 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 HTN (hypertension)   . Renal cyst, left   . Stroke ([place])    hx of 3 strokes in past   . 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 Urethral lesion    urethral mass    Patient Active Problem List   Diagnosis Date Noted  . 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  . Complete heart block ([place])   . Heart block   . Seizure ([place]) 05/22/2020  . Syncope 05/03/2020  . History of stroke 05/03/2020  . History of supraventricular tachycardia 05/03/2020  . Thrombocytopenia ([place]) 05/03/2020  . Acute kidney injury ([place]) 05/03/2020  . Memory impairment 05/03/2020  . Atypical chest pain 01/27/2020  . SVT (supraventricular tachycardia) ([place]) 06/03/2017  . Dizziness 08/02/2014  . 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 CVA (cerebral infarction) 09/13/2012  . Vertigo 09/12/2012  .  EAR PAIN 09/05/2009  . BACK PAIN 06/20/2009  . COLONIC POLYPS 05/06/2007  . 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 NONDEPENDENT ALCOHOL ABUSE IN REMISSION 05/06/2007  . HYPERTENSION, SEVERE 05/06/2007  . Hypertensive heart disease without heart failure 05/06/2007  . INTRACRANIAL HEMORRHAGE 05/06/2007  . Cerebrovascular disease 05/06/2007  . HEMORRHOIDS 05/06/2007  . INGUINAL HERNIA, RIGHT 05/06/2007  . RENAL CYST 05/06/2007  . HEMATURIA UNSPECIFIED 05/06/2007  . CONGENITAL ANOMALY OF DIAPHRAGM  05/06/2007  . PSA, INCREASED 05/06/2007  . LIVER FUNCTION TESTS, ABNORMAL 05/06/2007  . BPH (benign prostatic hypertrophy) with urinary obstruction 05/06/2007    Past Surgical History:  Procedure Laterality Date  . CARDIOVASCULAR STRESS TEST  05/05/2007   normal nuclear study w/ no ischemia/  normal LV fucntion and wall motion , ef60%  . COLONOSCOPY  last one 04-06-2012  . CYSTO/  LEFT RETROGRADE PYELOGRAM/ CYTOLOGY WASHINGS/  URETEROSCOPY  03/05/2000  . INGUINAL HERNIA REPAIR Bilateral 1965 and 1980's  . LAPAROSCOPIC INGUINAL HERNIA WITH UMBILICAL HERNIA Right 61/68/3729  . LIVER BIOPSY  1980's and 2004  . 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   Procedure: PACEMAKER IMPLANT;  Surgeon: Evans Lance, MD;  Location: [facility];  Service: Cardiovascular;  Laterality: N/A;  . SVT ABLATION N/A 11/15/2018   Procedure: SVT ABLATION;  Surgeon: Evans Lance, MD;  Location: [facility];  Service: Cardiovascular;  Laterality: N/A;  . TRANSTHORACIC ECHOCARDIOGRAM  09/13/2012   moderate LVH,  ef 60-65%/    . TRANSURETHRAL RESECTION OF BLADDER TUMOR N/A 08/11/2016   Procedure: TRANSURETHRAL RESECTION OF BLADDER TUMOR (TURBT);  Surgeon: Cleon Gustin, MD;  Location: [facility];  Service: Urology;  Laterality: N/A;       Family History  Problem Relation Age of Onset  . Rheum arthritis Mother   . Diabetes Mother   . Stroke Mother   . Heart attack Mother   . Kidney failure Mother   . Heart attack Father   . Heart disease Maternal Grandmother   . Rheum arthritis Maternal Grandmother   . Diabetes Maternal Grandmother   . Stroke Maternal Grandmother   . Colon cancer Neg Hx     Social History   Tobacco Use  . Smoking status: Former Smoker    Packs/day: 1.00    Years: 5.00    Pack years: 5.00    Types: Cigarettes    Quit date: 11/30/1981    Years since quitting: 38.8  . Smokeless tobacco: Never Used  Vaping Use  . Vaping Use: Never used  Substance Use  Topics  . Alcohol use: No    Alcohol/week: 0.0 standard drinks    Comment: hx abuse -- quit:  1963  . Drug use: No    Comment: hx abuse -- quit 1964 (iv heroin and cocaine    Home Medications Prior to Admission medications   Medication Sig Start Date End Date Taking? Authorizing Provider  allopurinol (ZYLOPRIM) 100 MG tablet Take 100 mg by mouth daily.    [provider]  amLODipine (NORVASC) 2.5 MG tablet Take 1 tablet (2.5 mg total) by mouth daily. 07/27/20   Lorretta Harp, MD  aspirin EC 81 MG EC tablet Take 1 tablet (81 mg total) by mouth daily. Swallow whole. 06/01/20   Lilland, Alana, DO  brimonidine (ALPHAGAN) 0.2 % ophthalmic solution Place 1 drop into both eyes 2 (two) times daily.    [provider]  dutasteride (AVODART) 0.5 MG capsule Take 0.5 mg by mouth daily.  [provider]  latanoprost (XALATAN) 0.005 % ophthalmic solution Place 1 drop into both eyes at bedtime.     [provider]  memantine (NAMENDA) 5 MG tablet Take 17.5 mg by mouth at bedtime.     [provider]  metoprolol succinate (TOPROL-XL) 25 MG 24 hr tablet Take 0.5 tablets (12.5 mg total) by mouth at bedtime. 05/31/20   Lilland, Alana, DO  tamsulosin (FLOMAX) 0.4 MG CAPS capsule Take 0.4 mg by mouth at bedtime.     [provider]    Allergies    Aricept [donepezil], Penicillins, and Viagra [sildenafil]  Review of Systems   Review of Systems  Constitutional: Negative for chills and fever.  Respiratory: Negative for shortness of breath.   Cardiovascular: Negative for chest pain.  Gastrointestinal: Negative for abdominal pain, diarrhea, nausea and vomiting.  Genitourinary: Negative for dysuria.  Neurological: Positive for dizziness. Negative for facial asymmetry, speech difficulty and numbness.    Physical Exam Updated Vital Signs BP (!) 162/103   Pulse 60   Temp 97.8 F (36.6 C) (Oral)   Resp 14   Ht 5\' 11"  (1.803 m)   Wt 74.4 kg    SpO2 96%   BMI 22.87 kg/m   Physical Exam Vitals and nursing note reviewed.  Constitutional:      General: He is not in acute distress.    Appearance: He is not ill-appearing.  HENT:     Head: Normocephalic.  Eyes:     Pupils: Pupils are equal, round, and reactive to light.  Cardiovascular:     Rate and Rhythm: Normal rate and regular rhythm.     Pulses: Normal pulses.     Heart sounds: Normal heart sounds. No murmur heard. No friction rub. No gallop.   Pulmonary:     Effort: Pulmonary effort is normal.     Breath sounds: Normal breath sounds.  Abdominal:     General: Abdomen is flat. There is no distension.     Palpations: Abdomen is soft.     Tenderness: There is no abdominal tenderness. There is no guarding or rebound.  Musculoskeletal:        General: Normal range of motion.     Cervical back: Neck supple.  Skin:    General: Skin is warm and dry.  Neurological:     General: No focal deficit present.     Mental Status: He is alert.     Comments: Speech is clear, able to follow commands CN III-XII intact Normal strength in upper and lower extremities bilaterally including dorsiflexion and plantar flexion, strong and equal grip strength Sensation grossly intact throughout Moves extremities without ataxia, coordination intact No pronator drift  Psychiatric:        Mood and Affect: Mood normal.        Behavior: Behavior normal.     ED Results / Procedures / Treatments   Labs (all labs ordered are listed, but only abnormal results are displayed) Labs Reviewed  CBC WITH DIFFERENTIAL/PLATELET - Abnormal; Notable for the following components:      Result Value   Platelets 132 (*)    All other components within normal limits  URINALYSIS, ROUTINE W REFLEX MICROSCOPIC - Abnormal; Notable for the following components:   Hgb urine dipstick SMALL (*)    Protein, ur 30 (*)    Leukocytes,Ua SMALL (*)    Bacteria, UA RARE (*)    All other components within normal limits   URINE CULTURE  BASIC METABOLIC PANEL  EKG EKG Interpretation  Date/Time:  Tuesday September 11 2020 11:44:35 EDT Ventricular Rate:  60 PR Interval:  204 QRS Duration: 154 QT Interval:  453 QTC Calculation: 453 R Axis:   213 Text Interpretation: Sinus rhythm Non-specific intra-ventricular conduction delay No significant change since last tracing Confirmed by Calvert Cantor [phone]) on 09/11/2020 12:08:40 PM   Radiology No results found.  Procedures Procedures   Medications Ordered in ED Medications - No data to display  ED Course  I have reviewed the triage vital signs and the nursing notes.  Pertinent labs & imaging results that were available during my care of the patient were reviewed by me and considered in my medical decision making (see chart for details).    MDM Rules/Calculators/A&P                         80 year old male presents to the ED due to intermittent dizziness that has been ongoing for numerous months.  Patient has been evaluated by ENT, cardiology, and neurology with unknown etiology.  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  No prodromal symptoms.  Patient describes dizziness as feeling off balance.  Upon arrival, stable vitals.  Patient in no acute distress and nontoxic-appearing.  Physical exam reassuring.  Normal neurological exam without neurological deficits.  Low suspicion for CVA.  Will obtain routine labs to rule out electrolyte abnormalities. Discussed case with Dr. Karle Starch who evaluated patient at bedside and agrees with assessment and plan.  BMP unremarkable no electrolyte derangements and normal renal function.  CBC reassuring with no leukocytosis and normal hemoglobin. UA clear for small hematuria, small leukocytes, proteinuria, and rare bacteria.  Patient denies any urinary symptoms.  Urine culture pending. EKG personally reviewed which demonstrates NSR with no signs of acute ischemia. Pacemaker intterogated with no events and  working appropriately.  Per chart review, patient has been evaluated numerous times by his neurologist and cardiologist for the same symptoms which is thought to be due to possible hypotension.  No signs of hypotension here in the ED.  Orthostatic vitals normal.  Low suspicion for CVA given normal neurological exam.  Low suspicion for cardiac event given no chest pain or shortness of breath and no pacemaker events.  Discussed results with patient and wife at bedside.  Advised them to follow-up with cardiologist and neurologist for further evaluation.  Patient able to ambulate here in the ED at baseline with walker. Strict ED precautions discussed with patient. Patient states understanding and agrees to plan. Patient discharged home in no acute distress and stable vitals  Final Clinical Impression(s) / ED Diagnoses Final diagnoses:  Dizziness  Near syncope    Rx / DC Orders ED Discharge Orders    None       Karie Kirks 09/11/20 1526    Truddie Hidden, MD 09/11/20 [phone]

## 2020-09-11 NOTE — ED Triage Notes (Signed)
Pt came via EMS due to dizziness. Pt states he was eating breakfast this morning and became really dizzy. Wife states this has been going on a month and has been seen several times for it. Pt did not have a syncopal episode. Pt is axox4.

## 2020-09-12 LAB — URINE CULTURE

## 2020-09-17 ENCOUNTER — Telehealth: Payer: Self-pay

## 2020-09-17 NOTE — Telephone Encounter (Signed)
Patient wife called, and requested medical records to be sent to [facility].  The fax number for the medical release is [phone].  Wife specifically asked for video monitoring records to be sent as ordered by Dr. [name].  I advised her to message to our medical records department for them to process.  Medical record has any questions advised the wife that they may call.

## 2020-09-20 DIAGNOSIS — H2511 Age-related nuclear cataract, right eye: Secondary | ICD-10-CM | POA: Diagnosis not present

## 2020-09-20 DIAGNOSIS — H401112 Primary open-angle glaucoma, right eye, moderate stage: Secondary | ICD-10-CM | POA: Diagnosis not present

## 2020-09-28 ENCOUNTER — Encounter: Payer: Self-pay | Admitting: Pharmacist Clinician (PhC)/ Clinical Pharmacy Specialist

## 2020-09-28 ENCOUNTER — Other Ambulatory Visit: Payer: Self-pay

## 2020-09-28 ENCOUNTER — Ambulatory Visit (INDEPENDENT_AMBULATORY_CARE_PROVIDER_SITE_OTHER): Payer: Medicare PPO | Admitting: Pharmacist Clinician (PhC)/ Clinical Pharmacy Specialist

## 2020-09-28 DIAGNOSIS — I10 Essential (primary) hypertension: Secondary | ICD-10-CM

## 2020-09-28 MED ORDER — AMLODIPINE BESYLATE 2.5 MG PO TABS: 2.5000 mg | ORAL_TABLET | Freq: Two times a day (BID) | ORAL | 3 refills | Status: DC

## 2020-09-28 NOTE — Assessment & Plan Note (Signed)
Patient with essential hypertension, noticeably more issues with elevated diastolic readings.  He is scheduled for a 3 day hospital observation next month for his dizzy spells.  Because of this, and his stability issues, will have him continue with current medications, but not add anything at this time.  Don't want to risk further episodes of hypotension, therefore will have him continue with home monitoring and return in 3 months for follow up.

## 2020-09-28 NOTE — Patient Instructions (Addendum)
Return for a a follow up appointment Monday July 25 at 2 pm  Check your blood pressure at home daily and keep record of the readings.  Take your BP meds as follows:  Continue with amlodipine 2.5 mg twice daily and metoprolol 12.5 mg each evening.   Continue with all other medications  Bring all of your meds, your BP cuff and your record of home blood pressures to your next appointment.  Exercise as you're able, try to walk approximately 30 minutes per day.  Keep salt intake to a minimum, especially watch canned and prepared boxed foods.  Eat more fresh fruits and vegetables and fewer canned items.  Avoid eating in fast food restaurants.    HOW TO TAKE YOUR BLOOD PRESSURE: . Rest 5 minutes before taking your blood pressure. .  Don't smoke or drink caffeinated beverages for at least 30 minutes before. . Take your blood pressure before (not after) you eat. . Sit comfortably with your back supported and both feet on the floor (don't cross your legs). . Elevate your arm to heart level on a table or a desk. . Use the proper sized cuff. It should fit smoothly and snugly around your bare upper arm. There should be enough room to slip a fingertip under the cuff. The bottom edge of the cuff should be 1 inch above the crease of the elbow. . Ideally, take 3 measurements at one sitting and record the average.

## 2020-09-28 NOTE — Progress Notes (Signed)
09/28/2020 Dustin Hamilton 12/24/1940 [id number]   HPI:  Dustin Hamilton is a 80 y.o. male patient of Dr Gwenlyn Found, who presents today for hypertension clinic evaluation.  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 When he saw Dr. Gwenlyn Found in February his pressure was noted to be just 100/78.  Dr. Gwenlyn Found had patient discontinue his amlodipine (he was taking 2.5 mg bid).  Apparently a few days after stopping the amlodipine it was noted by his PT that his pressure was elevated at 171/90.  They contacted our office and it was recommended that he go back on amlodipine.  He was then seen here at [facility] in March and was at 132/88.  Then at EP appointment earlier in April was 130/102.  Patient appears to have more of a diastolic hypertension, as systolic readings tend to be more at goal.    Over the past several months, his biggest complaint is dizziness on a fairly regular basis.  They note it can occur at any time, not just with positional changes.  Had somewhat limited his mobility, despite working with PT for balance/strength.  He is working with neurology to determine if these spells are neurologic in origin.    He is in the office today with his wife.  He continues to do PT on a weekly basis and wife states compliance with his medications.  He has lost about 10 pounds since his last visit to our office and she notes that his appetite is way down.   He was started on levetiracetam 125 mg twice daily, although he has not started yet, as his wife wanted to be sure it was okay with his other medications.    Blood Pressure Goal:  130/80  Current Medications: metoprolol succ 12.5 mg qhs, amlodipine 2.5 mg bid  Family Hx: mother with MI and stroke, died about 44; don't know father; no whole siblings; son with hypertension  Social Hx: no tobacco, no alcohol, drinks teas (hot and sweet) on occasion  Diet: eats out  regularly, knows he needs to watch sodium, but eats a mix of fast food, sandwich shop and sit down restaurants.    Exercise: physical therapy - to help with balance (once weekly), with home exercises  Home BP readings: home cuff, about 80 year old, from [place]  Had 1 episode of hypotension last week with BP at 67/50 - called EMS  30 readings, average 138/85  HR 63  (range 63-180/59-104)  Intolerances: dizziness with sildenafil  Labs: 1/22:  Na 144, K 4.0, Glu 90, BUN 20, SCr 1.38, GFR 52   Wt Readings from Last 3 Encounters:  09/28/20 153 lb (69.4 kg)  09/11/20 164 lb (74.4 kg)  09/04/20 162 lb (73.5 kg)   BP Readings from Last 3 Encounters:  09/28/20 122/80  09/11/20 (!) 175/102  09/04/20 (!) 130/102   Pulse Readings from Last 3 Encounters:  09/28/20 62  09/11/20 (!) 59  09/04/20 71    Current Outpatient Medications  Medication Sig Dispense Refill  . allopurinol (ZYLOPRIM) 100 MG tablet Take 100 mg by mouth daily. Taking prn gout    . amLODipine (NORVASC) 2.5 MG tablet Take 1 tablet (2.5 mg total) by mouth in the morning and at bedtime. 90 tablet 3  . aspirin EC 81 MG EC tablet Take 1 tablet (81 mg total) by mouth daily. Swallow whole. 30 tablet 11  . brimonidine (ALPHAGAN) 0.2 %  ophthalmic solution Place 1 drop into both eyes 2 (two) times daily.    Marland Kitchen dutasteride (AVODART) 0.5 MG capsule Take 0.5 mg by mouth daily.    Marland Kitchen latanoprost (XALATAN) 0.005 % ophthalmic solution Place 1 drop into both eyes at bedtime.     . levETIRAcetam (KEPPRA) 250 MG tablet Take 125 mg by mouth 2 (two) times daily.    . memantine (NAMENDA) 5 MG tablet Take 17.5 mg by mouth at bedtime.     . metoprolol succinate (TOPROL-XL) 25 MG 24 hr tablet Take 0.5 tablets (12.5 mg total) by mouth at bedtime. 30 tablet 1  . tamsulosin (FLOMAX) 0.4 MG CAPS capsule Take 0.4 mg by mouth at bedtime.      No current facility-administered medications for this visit.    Allergies  Allergen Reactions  . Aricept  [Donepezil]     dizziness  . Penicillins Hives  . Viagra [Sildenafil]     dizziness    Past Medical History:  Diagnosis Date  . BPH (benign prostatic hyperplasia)   . Cerebrovascular disease   . Congenital anomaly of diaphragm   . Elevated PSA   . Glaucoma, both eyes   . Hemorrhoid   . Hepatitis B surface antigen positive    02-20-2011  . History of adenomatous polyp of colon    2007, 2009 and 2013  tubular adenoma's  . History of alcohol abuse    quit 1963  . History of cerebral parenchymal hemorrhage    01/ 2006  left occiptial lobe related to hypertensive crisis  . History of CVA (cerebrovascular accident)    09-12-2012  left hippocampus/ amygdala junction and per MRI old white matter infarcts--  per pt residual short- term memory issues  . History of fatty infiltration of liver hx visit's at [facility] , last visit 05/ 2014   elvated LFT's ,  via liver bx 2004 related to hx alcohol and drug abuse (quit 1964)  . 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 HTN (hypertension)   . Renal cyst, left   . Stroke ([place])    hx of 3 strokes in past   . 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 Urethral lesion    urethral mass    Blood pressure 122/80, pulse 62, height 5\' 11"  (1.803 m), weight 153 lb (69.4 kg).  HYPERTENSION, SEVERE Patient with essential hypertension, noticeably more issues with elevated diastolic readings.  He is scheduled for a 3 day hospital observation next month for his dizzy spells.  Because of this, and his stability issues, will have him continue with current medications, but not add anything at this time.  Don't want to risk further episodes of hypotension, therefore will have him continue with home monitoring and return in 3 months for follow up.     Tommy Medal PharmD CPP [facility] [address] [place], [place] [address] [phone]

## 2020-10-11 DIAGNOSIS — I10 Essential (primary) hypertension: Secondary | ICD-10-CM | POA: Diagnosis not present

## 2020-10-11 DIAGNOSIS — N183 Chronic kidney disease, stage 3 unspecified: Secondary | ICD-10-CM | POA: Diagnosis not present

## 2020-10-16 DIAGNOSIS — I10 Essential (primary) hypertension: Secondary | ICD-10-CM | POA: Diagnosis not present

## 2020-10-16 DIAGNOSIS — N390 Urinary tract infection, site not specified: Secondary | ICD-10-CM | POA: Diagnosis not present

## 2020-10-22 NOTE — Progress Notes (Signed)
Cardiology Office Note:    Date:  10/24/2020   ID:  Dustin Hamilton, DOB 1940-06-14, MRN [id number]  PCP:  [facility]  Cardiologist:  Quay Burow, MD   Referring MD: [facility]   Chief Complaint  Patient presents with  . Follow-up    dizziness    History of Present Illness:    Dustin Hamilton is a 80 y.o. 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  He was hospitalized earlier in December 2021 for syncope and diagnosed with epilepsy during the hospitalization.  Unfortunately he was unable to tolerate his antiseizure medication.  Heart monitor placed at that time did show episodes of complete heart block with pauses of up to 8 seconds. It was noted that events surrounding the episode of complete heart block and pauses were unknown (e.g., during syncope +/- seizure).   He was referred to EP and saw Dr. Quentin Ore on 05/22/2020.  When he presented for that appointment, he slumped over and a CODE BLUE was activated in the waiting room.  Subsequent twelve-lead EKG showed atrial tachycardia in the 130s.  He was transported via EMS to the emergency room.  He was admitted and underwent neurological evaluation with EEG monitoring.  He subsequently had a Biotronik dual-chamber PPM placed on 05/28/2020.   While patient indeed had intermittent complete heart block, his syncope and recent epilepsy diagnosis has complicated his picture.  EEG during his last admission was negative for seizures or epileptiform changes.  His description of events also has features of vasovagal syncope / orthostatic intolerance.  Prior to discharge, it was noted that treatment with PPM may not resolve all of his dizziness and syncopal spells.  It was noted by EP that prior strips were reviAC was placed oewed and atrial flutter was in question.  DOn hold.  Will monitor for arrhythmia via device  download.  He was recently seen in [facility], ER on 06/21/2020 with an episode of syncope.  Per EDP, he had an episode of hypotension and decreased mental status.  He has had extensive prior work-up for syncope and follows with neurology. He was seen by ENT [facility]) who did not feel his symptoms were mediated by neuro otologic vertigo, including BPPV. He last saw Dr. Gwenlyn Found 07/24/20 and continued to have symptomatic hypotension, reported pressure was 80/60.  Dr. Gwenlyn Found stopped amlodipine and scheduled follow up in HTN clinic. He was seen by our clinical pharmacist 08/29/20 who recommended speaking with his urologist to see if he could stop flomax and monitor for improvement in his symptoms of dizziness. He was instructed to increase amlodipine back to 5 mg and bring BP cuff to next appt.   He presents today for follow up. He is here with his wife. He states he has good days and bad days - all of last week was bad in terms of dizziness. No syncope or falls.  He is going to [facility] in June for another EEG for 3-4 days. He is taking amlodipine 2.5 mg BID and has done well. BP running 140/80-90s at home.  No chest pain, dyspnea, orthopnea, and palpitations.  Past Medical History:  Diagnosis Date  . BPH (benign prostatic hyperplasia)   . Cerebrovascular disease   . Congenital anomaly of diaphragm   . Elevated PSA   . Glaucoma, both eyes   . Hemorrhoid   . Hepatitis B surface antigen positive  02-20-2011  . History of adenomatous polyp of colon    2007, 2009 and 2013  tubular adenoma's  . History of alcohol abuse    quit 1963  . History of cerebral parenchymal hemorrhage    01/ 2006  left occiptial lobe related to hypertensive crisis  . History of CVA (cerebrovascular accident)    09-12-2012  left hippocampus/ amygdala junction and per MRI old white matter infarcts--  per pt residual short- term memory issues  . History of fatty infiltration of liver hx visit's at [facility] , last visit 05/ 2014    elvated LFT's ,  via liver bx 2004 related to hx alcohol and drug abuse (quit 1964)  . 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 HTN (hypertension)   . Renal cyst, left   . Stroke ([place])    hx of 3 strokes in past   . 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 Urethral lesion    urethral mass    Past Surgical History:  Procedure Laterality Date  . CARDIOVASCULAR STRESS TEST  05/05/2007   normal nuclear study w/ no ischemia/  normal LV fucntion and wall motion , ef60%  . COLONOSCOPY  last one 04-06-2012  . CYSTO/  LEFT RETROGRADE PYELOGRAM/ CYTOLOGY WASHINGS/  URETEROSCOPY  03/05/2000  . INGUINAL HERNIA REPAIR Bilateral 1965 and 1980's  . LAPAROSCOPIC INGUINAL HERNIA WITH UMBILICAL HERNIA Right 72/53/6644  . LIVER BIOPSY  1980's and 2004  . 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   Procedure: PACEMAKER IMPLANT;  Surgeon: Evans Lance, MD;  Location: [place] [facility];  Service: Cardiovascular;  Laterality: N/A;  . SVT ABLATION N/A 11/15/2018   Procedure: SVT ABLATION;  Surgeon: Evans Lance, MD;  Location: [place] [facility];  Service: Cardiovascular;  Laterality: N/A;  . TRANSTHORACIC ECHOCARDIOGRAM  09/13/2012   moderate LVH,  ef 60-65%/    . TRANSURETHRAL RESECTION OF BLADDER TUMOR N/A 08/11/2016   Procedure: TRANSURETHRAL RESECTION OF BLADDER TUMOR (TURBT);  Surgeon: Cleon Gustin, MD;  Location: [facility];  Service: Urology;  Laterality: N/A;    Current Medications: Current Meds  Medication Sig  . allopurinol (ZYLOPRIM) 100 MG tablet Take 100 mg by mouth daily. Taking prn gout  . amLODipine (NORVASC) 2.5 MG tablet Take 1 tablet (2.5 mg total) by mouth in the morning and at bedtime.  Marland Kitchen aspirin EC 81 MG EC tablet Take 1 tablet (81 mg total) by mouth daily. Swallow whole.  . brimonidine (ALPHAGAN) 0.2 % ophthalmic solution Place 1 drop into both eyes 2 (two) times daily.  Marland Kitchen dutasteride (AVODART) 0.5 MG  capsule Take 0.5 mg by mouth daily.  Marland Kitchen latanoprost (XALATAN) 0.005 % ophthalmic solution Place 1 drop into both eyes at bedtime.   . memantine (NAMENDA) 5 MG tablet Take 17.5 mg by mouth at bedtime.   . metoprolol succinate (TOPROL-XL) 25 MG 24 hr tablet Take 0.5 tablets (12.5 mg total) by mouth at bedtime.  . tamsulosin (FLOMAX) 0.4 MG CAPS capsule Take 0.4 mg by mouth at bedtime.      Allergies:   Aricept [donepezil], Penicillins, and Viagra [sildenafil]   Social History   Socioeconomic History  . Marital status: Married    Spouse name: [name]  . Number of children: 1  . Years of education: College  . Highest education level: Not on file  Occupational History  . Occupation: Geophysicist/field seismologist  Tobacco  Use  . Smoking status: Former Smoker    Packs/day: 1.00    Years: 5.00    Pack years: 5.00    Types: Cigarettes    Quit date: 11/30/1981    Years since quitting: 38.9  . Smokeless tobacco: Never Used  Vaping Use  . Vaping Use: Never used  Substance and Sexual Activity  . Alcohol use: No    Alcohol/week: 0.0 standard drinks    Comment: hx abuse -- quit:  1963  . 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 Sexual activity: Not on file  Other Topics Concern  . Not on file  Social History Narrative   Patient lives at home with his spouse.   Caffeine Use:  Tea, lots   Social Determinants of Health   Financial Resource Strain: Not on file  Food Insecurity: Not on file  Transportation Needs: Not on file  Physical Activity: Not on file  Stress: Not on file  Social Connections: Not on file     Family History: The patient's family history includes Diabetes in his maternal grandmother and mother; Heart attack in his father and mother; Heart disease in his maternal grandmother; Kidney failure in his mother; Rheum arthritis in his maternal grandmother and mother; Stroke in his maternal grandmother and mother. There is no history of Colon cancer.  ROS:    Please see the history of present illness.    All other systems reviewed and are negative.  EKGs/Labs/Other Studies Reviewed:    The following studies were reviewed today:  Echo 05/23/20 1. Left ventricular ejection fraction, by estimation, is 60 to 65%. The  left ventricle has normal function. The left ventricle has no regional  wall motion abnormalities. There is mild asymmetric left ventricular  hypertrophy of the basal-septal segment.  Left ventricular diastolic parameters are consistent with Grade I  diastolic dysfunction (impaired relaxation).  2. Right ventricular systolic function is normal. The right ventricular  size is mildly enlarged.  3. A small pericardial effusion is present. The pericardial effusion is  circumferential. There is no evidence of cardiac tamponade.  4. The mitral valve is grossly normal. No evidence of mitral valve  regurgitation. No evidence of mitral stenosis.  5. The aortic valve is tricuspid. There is mild calcification of the  aortic valve. Aortic valve regurgitation is not visualized. Mild aortic  valve sclerosis is present, with no evidence of aortic valve stenosis.  6. The inferior vena cava is normal in size with greater than 50%  respiratory variability, suggesting right atrial pressure of 3 mmHg.   EKG:  EKG is not ordered today.    Recent Labs: 05/04/2020: TSH 1.050 05/22/2020: Magnesium 1.9 06/21/2020: ALT 17 09/11/2020: BUN 14; Creatinine, Ser 1.04; Hemoglobin 16.0; Platelets 132; Potassium 4.0; Sodium 142  Recent Lipid Panel    Component Value Date/Time   CHOL 123 09/13/2012 0330   TRIG 112 09/13/2012 0330   HDL 25 (L) 09/13/2012 0330   CHOLHDL 4.9 09/13/2012 0330   VLDL 22 09/13/2012 0330   LDLCALC 76 09/13/2012 0330    Physical Exam:    VS:  BP 140/80   Pulse 74   Ht 5\' 11"  (1.803 m)   Wt 166 lb (75.3 kg)   SpO2 98%   BMI 23.15 kg/m     Wt Readings from Last 3 Encounters:  10/24/20 166 lb (75.3 kg)  09/28/20  153 lb (69.4 kg)  09/11/20 164 lb (74.4 kg)  GEN: elderly male in NAD HEENT: Normal NECK: No JVD; No carotid bruits LYMPHATICS: No lymphadenopathy CARDIAC: RRR, no murmurs, rubs, gallops RESPIRATORY:  Clear to auscultation without rales, wheezing or rhonchi  ABDOMEN: Soft, non-tender, non-distended MUSCULOSKELETAL:  No edema; No deformity  SKIN: Warm and dry NEUROLOGIC:  Alert and oriented x 3 PSYCHIATRIC:  Normal affect   ASSESSMENT:    1. Heart block AV complete ([place])   2. Pacemaker   3. Dizziness   4. Seizures ([place])   5. Syncope, unspecified syncope type   6. HYPERTENSION, SEVERE    PLAN:    In order of problems listed above:  History of AVNRT ablation Intermittent complete heart block Status post PPM (05/28/20) - has not had complete resolution of his symptoms of dizziness and hypotension   Seizures Vasovagal syncope Orthostatic hypotension - has had workup by neurology and ENT - keppra has been stopped - he is now seeing neurology at [facility] who restarted keppra at a lower dose  - will speak with urology about flomax and avodart, is there an option with less potential for orthostatic hypotension - no recent falls or LOC since ER visit   Hypertension - struggling with balance between HTN and episodes of hypotension - running in the 140/80-90s at home - no medication changes, doing well on amlodipine 2.5 mg BID - I would rather him run a touch high than risk more orthostasis  - He is working on increasing protein intake - nephrology recently told him he was dehydrated and needed to drink more water - can use salty foods and water on days he is having dizziness    Follow up with Dr. Gwenlyn Found in 4-5 months.  I will fax my notes to the [place] Dr. Theda Sers: [phone] Dr. Raynelle Dick [phone]   Medication Adjustments/Labs and Tests Ordered: Current medicines are reviewed at length with the patient today.  Concerns regarding medicines are outlined above.  No  orders of the defined types were placed in this encounter.  No orders of the defined types were placed in this encounter.   Signed, Ledora Bottcher, [place]  10/24/2020 12:50 PM     [facility]

## 2020-10-24 ENCOUNTER — Ambulatory Visit (INDEPENDENT_AMBULATORY_CARE_PROVIDER_SITE_OTHER): Payer: Medicare PPO | Admitting: Physician Assistant

## 2020-10-24 ENCOUNTER — Other Ambulatory Visit: Payer: Self-pay

## 2020-10-24 ENCOUNTER — Encounter: Payer: Self-pay | Admitting: Physician Assistant

## 2020-10-24 VITALS — BP 140/80 | HR 74 | Ht 71.0 in | Wt 166.0 lb

## 2020-10-24 DIAGNOSIS — R569 Unspecified convulsions: Secondary | ICD-10-CM

## 2020-10-24 DIAGNOSIS — Z950 Presence of cardiac pacemaker: Secondary | ICD-10-CM

## 2020-10-24 DIAGNOSIS — I10 Essential (primary) hypertension: Secondary | ICD-10-CM

## 2020-10-24 DIAGNOSIS — R42 Dizziness and giddiness: Secondary | ICD-10-CM | POA: Diagnosis not present

## 2020-10-24 DIAGNOSIS — I442 Atrioventricular block, complete: Secondary | ICD-10-CM | POA: Diagnosis not present

## 2020-10-24 DIAGNOSIS — R55 Syncope and collapse: Secondary | ICD-10-CM | POA: Diagnosis not present

## 2020-10-24 NOTE — Patient Instructions (Signed)
Medication Instructions:  The current medical regimen is effective;  continue present plan and medications as directed. Please refer to the Current Medication list given to you today. *If you need a refill on your cardiac medications before your next appointment, please call your pharmacy*  Lab Work:   Testing/Procedures:  NONE    NONE  Follow-Up: Your next appointment:  4-5 month(s) In Person with Quay Burow, MD OR IF UNAVAILABLE [place], you and your health needs are our priority.  As part of our continuing mission to provide you with exceptional heart care, we have created designated Provider Care Teams.  These Care Teams include your primary Cardiologist (physician) and Advanced Practice Providers (APPs -  Physician Assistants and Nurse Practitioners) who all work together to provide you with the care you need, when you need it.

## 2020-11-12 ENCOUNTER — Ambulatory Visit: Payer: Medicare PPO | Admitting: Diagnostic Neuroimaging

## 2020-11-27 ENCOUNTER — Ambulatory Visit (INDEPENDENT_AMBULATORY_CARE_PROVIDER_SITE_OTHER): Payer: Medicare PPO

## 2020-11-27 DIAGNOSIS — I442 Atrioventricular block, complete: Secondary | ICD-10-CM | POA: Diagnosis not present

## 2020-11-27 LAB — CUP PACEART REMOTE DEVICE CHECK
Date Time Interrogation Session: 20220627114848
Implantable Lead Implant Date: 20211227
Implantable Lead Implant Date: 20211227
Implantable Lead Location: 753859
Implantable Lead Location: 753860
Implantable Lead Model: 377
Implantable Lead Model: 377
Implantable Lead Serial Number: 8000018960
Implantable Lead Serial Number: 8000042643
Implantable Pulse Generator Implant Date: 20211227
Pulse Gen Model: 407145
Pulse Gen Serial Number: 69915927

## 2020-12-17 NOTE — Progress Notes (Signed)
Remote pacemaker transmission.   

## 2020-12-24 ENCOUNTER — Other Ambulatory Visit: Payer: Self-pay

## 2020-12-24 ENCOUNTER — Ambulatory Visit (INDEPENDENT_AMBULATORY_CARE_PROVIDER_SITE_OTHER): Payer: Medicare PPO | Admitting: Pharmacist Clinician (PhC)/ Clinical Pharmacy Specialist

## 2020-12-24 DIAGNOSIS — I119 Hypertensive heart disease without heart failure: Secondary | ICD-10-CM | POA: Diagnosis not present

## 2020-12-24 NOTE — Patient Instructions (Signed)
Return for a a follow up appointment with Dr. Gwenlyn Found in October  Check your blood pressure at home 2-3 times each week and keep record of the readings.   Take your BP meds as follows:  Continue with your current medications.    Bring all of your meds, your BP cuff and your record of home blood pressures to your next appointment.  Exercise as you're able, try to walk approximately 30 minutes per day.  Keep salt intake to a minimum, especially watch canned and prepared boxed foods.  Eat more fresh fruits and vegetables and fewer canned items.  Avoid eating in fast food restaurants.    HOW TO TAKE YOUR BLOOD PRESSURE: Rest 5 minutes before taking your blood pressure.  Don't smoke or drink caffeinated beverages for at least 30 minutes before. Take your blood pressure before (not after) you eat. Sit comfortably with your back supported and both feet on the floor (don't cross your legs). Elevate your arm to heart level on a table or a desk. Use the proper sized cuff. It should fit smoothly and snugly around your bare upper arm. There should be enough room to slip a fingertip under the cuff. The bottom edge of the cuff should be 1 inch above the crease of the elbow. Ideally, take 3 measurements at one sitting and record the average.

## 2020-12-24 NOTE — Progress Notes (Signed)
12/24/2020 UPTON KOSTECKI Jan 29, 1941 [id number]   HPI:  Dustin Hamilton is a 80 y.o. male patient of Dr Dustin Hamilton, who presents today for hypertension clinic evaluation.  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 When he saw Dr. Gwenlyn Hamilton in February his pressure was noted to be just 100/78.  Dr. Gwenlyn Hamilton had patient discontinue his amlodipine (he was taking 2.5 mg bid).   Apparently a few days after stopping the amlodipine it was noted by his PT that his pressure was elevated at 171/90.  They contacted our office and it was recommended that he go back on amlodipine.  He was then seen here at [facility] in March and was at 132/88.  Then at EP appointment earlier in April was 130/102.  Patient appears to have more of a diastolic hypertension, as systolic readings tend to be more at goal.    Over the past several months, his biggest complaint is dizziness on a fairly regular basis.  They note it can occur at any time, not just with positional changes.  Had somewhat limited his mobility, despite working with PT for balance/strength.  He is working with neurology to determine if these spells are neurologic in origin.    It has been 3 months since I saw him last.  He continues to be seen by neurology for episodes of decreased attentiveness.  He is scheduled for an MRI later this week.   He did stop the tamsulosin about 1 week ago and has not noticed any changes in dizziness as of yet.  Neurology did recommend that he start cholesterol lowering medication and prescribed atorvastatin 40 mg, although he has not yet started.  His wife wanted to get an opinion from cardiology.    Blood Pressure Goal:  130/80  Current Medications: metoprolol succ 12.5 mg qhs, amlodipine 2.5 mg bid  Family Hx: mother with MI and stroke, died about 69; don't know father; no whole siblings; son with hypertension  Social Hx: no tobacco, no  alcohol, drinks teas (hot and sweet) on occasion  Diet: not eating out as much - sister in law has moved down from [place] for the summer and is doing much of the cooking.  Doesn't add much salt to foods, but doesn't monitor too closely.  Niece will move in to help once sister in law goes back [place]  Exercise: physical therapy - to help with balance (once weekly), with home exercises  Home BP readings: home cuff, about 80 year old, from [place]  No hypotensive episodes < [id number] systolic since early May  Last 6 weeks show average at 137/83, in line with last visit average of 138/85  Intolerances: dizziness with sildenafil  Labs: 1/22:  Na 144, K 4.0, Glu 90, BUN 20, SCr 1.38, GFR 52   Wt Readings from Last 3 Encounters:  12/24/20 163 lb 6.4 oz (74.1 kg)  10/24/20 166 lb (75.3 kg)  09/28/20 153 lb (69.4 kg)   BP Readings from Last 3 Encounters:  12/24/20 128/78  10/24/20 140/80  09/28/20 122/80   Pulse Readings from Last 3 Encounters:  12/24/20 70  10/24/20 74  09/28/20 62    Current Outpatient Medications  Medication Sig Dispense Refill   allopurinol (ZYLOPRIM) 100 MG tablet Take 100 mg by mouth daily. Taking prn gout     amLODipine (NORVASC) 2.5 MG tablet Take 1 tablet (2.5 mg total) by mouth in the morning and  at bedtime. 90 tablet 3   aspirin EC 81 MG EC tablet Take 1 tablet (81 mg total) by mouth daily. Swallow whole. 30 tablet 11   atorvastatin (LIPITOR) 40 MG tablet Take 1 tablet by mouth daily.     brimonidine (ALPHAGAN) 0.2 % ophthalmic solution Place 1 drop into both eyes 2 (two) times daily.     dutasteride (AVODART) 0.5 MG capsule Take 0.5 mg by mouth daily.     latanoprost (XALATAN) 0.005 % ophthalmic solution Place 1 drop into both eyes at bedtime.      memantine (NAMENDA) 5 MG tablet Take 17.5 mg by mouth at bedtime.      metoprolol succinate (TOPROL-XL) 25 MG 24 hr tablet Take 0.5 tablets (12.5 mg total) by mouth at bedtime. 30 tablet 1   Nutritional Supplements (ENSURE  ACTIVE HEART HEALTH PO) Take 1 Bottle by mouth as needed.     tamsulosin (FLOMAX) 0.4 MG CAPS capsule Take 0.4 mg by mouth at bedtime.  (Patient not taking: Reported on 12/24/2020)     No current facility-administered medications for this visit.    Allergies  Allergen Reactions   Aricept [Donepezil]     dizziness   Penicillins Hives   Viagra [Sildenafil]     dizziness    Past Medical History:  Diagnosis Date   BPH (benign prostatic hyperplasia)    Cerebrovascular disease    Congenital anomaly of diaphragm    Elevated PSA    Glaucoma, both eyes    Hemorrhoid    Hepatitis B surface antigen positive    02-20-2011   History of adenomatous polyp of colon    2007, 2009 and 2013  tubular adenoma's   History of alcohol abuse    quit 1963   History of cerebral parenchymal hemorrhage    01/ 2006  left occiptial lobe related to hypertensive crisis   History of CVA (cerebrovascular accident)    09-12-2012  left hippocampus/ amygdala junction and per MRI old white matter infarcts--  per pt residual short- term memory issues   History of fatty infiltration of liver hx visit's at [facility] , last visit 05/ 2014   elvated LFT's ,  via liver bx 2004 related to hx alcohol and drug abuse (quit 1964)   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 rate 15, height '5\' 11"'$  (1.803 m), weight 163 lb 6.4 oz (74.1 kg), SpO2 97 %.  Hypertensive heart disease without heart failure Patient with essential hypertension as well as orthostatic hypotension.  Today had a 12 point drop in pressure when standing, but did not feel any hypotensive symptoms.  Home blood pressures within acceptable range, so will make no changes today and have him  continue to monitor 2-3 times each week.  He is scheduled to see Dr. Gwenlyn Hamilton in October, we can see him after that should he need further blood pressure management.     Tommy Medal PharmD CPP [facility] [address] [place], [place] [address] [phone]

## 2020-12-24 NOTE — Assessment & Plan Note (Addendum)
Patient with essential hypertension as well as orthostatic hypotension.  Today had a 12 point drop in pressure when standing, but did not feel any hypotensive symptoms.  Home blood pressures within acceptable range, so will make no changes today and have him continue to monitor 2-3 times each week. Suggested that they give him another 2-3 weeks without the tamsulosin, and if at that point his symptoms of dizziness/weakness are unchanged, he can re-start.   He is scheduled to see Dr. Gwenlyn Found in October, we can see him after that should he need further blood pressure management.

## 2021-01-28 DIAGNOSIS — F0151 Vascular dementia with behavioral disturbance: Secondary | ICD-10-CM | POA: Diagnosis not present

## 2021-01-28 DIAGNOSIS — Z8673 Personal history of transient ischemic attack (TIA), and cerebral infarction without residual deficits: Secondary | ICD-10-CM | POA: Diagnosis not present

## 2021-01-28 DIAGNOSIS — R6889 Other general symptoms and signs: Secondary | ICD-10-CM | POA: Diagnosis not present

## 2021-02-26 ENCOUNTER — Ambulatory Visit (INDEPENDENT_AMBULATORY_CARE_PROVIDER_SITE_OTHER): Payer: Medicare PPO

## 2021-02-26 DIAGNOSIS — I442 Atrioventricular block, complete: Secondary | ICD-10-CM | POA: Diagnosis not present

## 2021-02-26 LAB — CUP PACEART REMOTE DEVICE CHECK
Date Time Interrogation Session: 20220927075525
Implantable Lead Implant Date: 20211227
Implantable Lead Implant Date: 20211227
Implantable Lead Location: 753859
Implantable Lead Location: 753860
Implantable Lead Model: 377
Implantable Lead Model: 377
Implantable Lead Serial Number: 8000018960
Implantable Lead Serial Number: 8000042643
Implantable Pulse Generator Implant Date: 20211227
Pulse Gen Model: 407145
Pulse Gen Serial Number: 69915927

## 2021-02-27 DIAGNOSIS — Z20822 Contact with and (suspected) exposure to covid-19: Secondary | ICD-10-CM | POA: Diagnosis not present

## 2021-03-05 NOTE — Progress Notes (Signed)
Remote pacemaker transmission.   

## 2021-03-30 ENCOUNTER — Emergency Department (HOSPITAL_COMMUNITY)
Admission: EM | Admit: 2021-03-30 | Discharge: 2021-03-31 | Disposition: A | Discharge status: Home / Self Care | Payer: No Typology Code available for payment source | Attending: Emergency Medicine | Admitting: Emergency Medicine

## 2021-03-30 ENCOUNTER — Emergency Department (HOSPITAL_COMMUNITY): Payer: No Typology Code available for payment source

## 2021-03-30 DIAGNOSIS — R509 Fever, unspecified: Secondary | ICD-10-CM | POA: Diagnosis not present

## 2021-03-30 DIAGNOSIS — J9811 Atelectasis: Secondary | ICD-10-CM | POA: Diagnosis not present

## 2021-03-30 DIAGNOSIS — Z87891 Personal history of nicotine dependence: Secondary | ICD-10-CM | POA: Insufficient documentation

## 2021-03-30 DIAGNOSIS — R791 Abnormal coagulation profile: Secondary | ICD-10-CM | POA: Diagnosis not present

## 2021-03-30 DIAGNOSIS — I10 Essential (primary) hypertension: Secondary | ICD-10-CM | POA: Insufficient documentation

## 2021-03-30 DIAGNOSIS — Z79899 Other long term (current) drug therapy: Secondary | ICD-10-CM | POA: Diagnosis not present

## 2021-03-30 DIAGNOSIS — Z20822 Contact with and (suspected) exposure to covid-19: Secondary | ICD-10-CM | POA: Diagnosis not present

## 2021-03-30 DIAGNOSIS — G319 Degenerative disease of nervous system, unspecified: Secondary | ICD-10-CM | POA: Diagnosis not present

## 2021-03-30 DIAGNOSIS — R410 Disorientation, unspecified: Secondary | ICD-10-CM | POA: Diagnosis not present

## 2021-03-30 DIAGNOSIS — R531 Weakness: Secondary | ICD-10-CM | POA: Diagnosis present

## 2021-03-30 DIAGNOSIS — F0390 Unspecified dementia without behavioral disturbance: Secondary | ICD-10-CM | POA: Diagnosis not present

## 2021-03-30 DIAGNOSIS — R0689 Other abnormalities of breathing: Secondary | ICD-10-CM | POA: Diagnosis not present

## 2021-03-30 DIAGNOSIS — I4430 Unspecified atrioventricular block: Secondary | ICD-10-CM | POA: Diagnosis not present

## 2021-03-30 DIAGNOSIS — Z7982 Long term (current) use of aspirin: Secondary | ICD-10-CM | POA: Diagnosis not present

## 2021-03-30 DIAGNOSIS — R4182 Altered mental status, unspecified: Secondary | ICD-10-CM | POA: Diagnosis not present

## 2021-03-30 DIAGNOSIS — Z950 Presence of cardiac pacemaker: Secondary | ICD-10-CM | POA: Insufficient documentation

## 2021-03-30 DIAGNOSIS — J986 Disorders of diaphragm: Secondary | ICD-10-CM | POA: Diagnosis not present

## 2021-03-30 DIAGNOSIS — I6782 Cerebral ischemia: Secondary | ICD-10-CM | POA: Diagnosis not present

## 2021-03-30 DIAGNOSIS — I440 Atrioventricular block, first degree: Secondary | ICD-10-CM | POA: Diagnosis not present

## 2021-03-30 DIAGNOSIS — A419 Sepsis, unspecified organism: Secondary | ICD-10-CM | POA: Diagnosis not present

## 2021-03-30 LAB — CBC WITH DIFFERENTIAL/PLATELET
Abs Immature Granulocytes: 0.02 10*3/uL (ref 0.00–0.07)
Basophils Absolute: 0 10*3/uL (ref 0.0–0.1)
Basophils Relative: 0 %
Eosinophils Absolute: 0.1 10*3/uL (ref 0.0–0.5)
Eosinophils Relative: 1 %
HCT: 46 % (ref 39.0–52.0)
Hemoglobin: 16 g/dL (ref 13.0–17.0)
Immature Granulocytes: 0 %
Lymphocytes Relative: 13 %
Lymphs Abs: 0.7 10*3/uL (ref 0.7–4.0)
MCH: 31.9 pg (ref 26.0–34.0)
MCHC: 34.8 g/dL (ref 30.0–36.0)
MCV: 91.8 fL (ref 80.0–100.0)
Monocytes Absolute: 0.4 10*3/uL (ref 0.1–1.0)
Monocytes Relative: 8 %
Neutro Abs: 4 10*3/uL (ref 1.7–7.7)
Neutrophils Relative %: 78 %
Platelets: 115 10*3/uL — ABNORMAL LOW (ref 150–400)
RBC: 5.01 MIL/uL (ref 4.22–5.81)
RDW: 13.5 % (ref 11.5–15.5)
WBC: 5.1 10*3/uL (ref 4.0–10.5)
nRBC: 0 % (ref 0.0–0.2)

## 2021-03-30 LAB — COMPREHENSIVE METABOLIC PANEL
ALT: 21 U/L (ref 0–44)
AST: 22 U/L (ref 15–41)
Albumin: 3.3 g/dL — ABNORMAL LOW (ref 3.5–5.0)
Alkaline Phosphatase: 69 U/L (ref 38–126)
Anion gap: 9 (ref 5–15)
BUN: 15 mg/dL (ref 8–23)
CO2: 21 mmol/L — ABNORMAL LOW (ref 22–32)
Calcium: 9.5 mg/dL (ref 8.9–10.3)
Chloride: 104 mmol/L (ref 98–111)
Creatinine, Ser: 0.96 mg/dL (ref 0.61–1.24)
GFR, Estimated: >60 mL/min (ref >60)
Glucose, Bld: 152 mg/dL — ABNORMAL HIGH (ref 70–99)
Potassium: 3.9 mmol/L (ref 3.5–5.1)
Sodium: 134 mmol/L — ABNORMAL LOW (ref 135–145)
Total Bilirubin: 0.6 mg/dL (ref 0.3–1.2)
Total Protein: 6.7 g/dL (ref 6.5–8.1)

## 2021-03-30 LAB — PROTIME-INR
INR: 1 (ref 0.8–1.2)
Prothrombin Time: 13.6 seconds (ref 11.4–15.2)

## 2021-03-30 LAB — LACTIC ACID, PLASMA: Lactic Acid, Venous: 1.7 mmol/L (ref 0.5–1.9)

## 2021-03-30 LAB — CBG MONITORING, ED: Glucose-Capillary: 169 mg/dL — ABNORMAL HIGH (ref 70–99)

## 2021-03-30 LAB — APTT: aPTT: 22 seconds — ABNORMAL LOW (ref 24–36)

## 2021-03-30 MED ORDER — ACETAMINOPHEN 325 MG PO TABS
650.0000 mg | ORAL_TABLET | Freq: Once | ORAL | Status: AC
  Administered 2021-03-30: 650 mg via ORAL
  Filled 2021-03-30: qty 2

## 2021-03-30 NOTE — ED Triage Notes (Signed)
PT BIB [facility] from home c/o weakness & somnolence since waking up this morning. Per EMS, pt is usually very interactive w family. However, today appeared to be tired and had been laying in bed all day long. Spiked a fever w EMS. 100.4.  pt has dementia.   EMS VITALS:   Bp 170/100 CBG 200

## 2021-03-31 ENCOUNTER — Emergency Department (HOSPITAL_COMMUNITY): Payer: No Typology Code available for payment source

## 2021-03-31 DIAGNOSIS — R410 Disorientation, unspecified: Secondary | ICD-10-CM | POA: Diagnosis not present

## 2021-03-31 DIAGNOSIS — G319 Degenerative disease of nervous system, unspecified: Secondary | ICD-10-CM | POA: Diagnosis not present

## 2021-03-31 DIAGNOSIS — I6782 Cerebral ischemia: Secondary | ICD-10-CM | POA: Diagnosis not present

## 2021-03-31 LAB — RESP PANEL BY RT-PCR (FLU A&B, COVID) ARPGX2
Influenza A by PCR: NEGATIVE
Influenza B by PCR: NEGATIVE
SARS Coronavirus 2 by RT PCR: NEGATIVE

## 2021-03-31 LAB — URINALYSIS, ROUTINE W REFLEX MICROSCOPIC
Bilirubin Urine: NEGATIVE
Glucose, UA: NEGATIVE mg/dL
Hgb urine dipstick: NEGATIVE
Ketones, ur: NEGATIVE mg/dL
Nitrite: NEGATIVE
Protein, ur: 30 mg/dL — AB
Specific Gravity, Urine: 1.014 (ref 1.005–1.030)
pH: 5 (ref 5.0–8.0)

## 2021-03-31 MED ORDER — SODIUM CHLORIDE 0.9 % IV BOLUS
1000.0000 mL | Freq: Once | INTRAVENOUS | Status: AC
  Administered 2021-03-31: 1000 mL via INTRAVENOUS

## 2021-03-31 NOTE — ED Notes (Signed)
In and out cath nit needed verified with EDP

## 2021-03-31 NOTE — ED Notes (Signed)
Pt ambulated in hallway with walker without difficulty and with steady gait.

## 2021-03-31 NOTE — Discharge Instructions (Addendum)
Thank you for allowing me to care for you today in the Emergency Department.   You were found to have a fever today.  However, there was no clear source of infection.  Since you have been having a cough for the last few days, I suspect that this is related to a viral upper respiratory infection.  You did not test positive for influenza or COVID.  You can manage your symptoms by treating them as needed.  You can have 650 mg of Tylenol once every 6 hours for fever.  Make sure that you are drinking plenty of fluids.  If you get dehydrated, this can cause you to feel more weak.  Please follow-up with your primary care provider for reevaluation this week.  Return to the emergency department if you pass out, if you become unable to walk, if you develop difficulty breathing, or other new, concerning symptoms.

## 2021-03-31 NOTE — ED Notes (Signed)
Patient transported to CT 

## 2021-03-31 NOTE — ED Provider Notes (Signed)
[facility] EMERGENCY DEPARTMENT Provider Note   CSN: [id number] Arrival date & time: 03/30/21  2208     History Chief Complaint  Patient presents with   Weakness    Dustin Hamilton is a 80 y.o. male with a history of mild dementia.  CVA, alcohol use disorder in remission, BPH, HTN, heart block s/p pacemaker who presents the emergency department by EMS with a chief complaint of altered mental status.  In the ED, the patient states are not sure I am here, but my wife wanted me to come get checked out.  He has no complaints.  He is alert and oriented x3.  History is otherwise provided by the patient's wife.  She states that he has been sleeping on and off for most of the day. "  He seems to be in and out of it."  He was found to have a fever when EMS arrived.  She states that his heart rate is usually around 60, but was around 90 when EMS arrived, which is unusual for him.  She reports that she is slightly confused about his medical history, as he was previously told that he may have seizures, but was later told that he is probably not having seizures and was taken off of his seizure medication by neurology.  She states that he has episodes where he will "space out" almost like he "goes to sleep and stop talking" Will slur speech with episodes and will try to talk to you.  These episodes will last about 5 minutes.  However, she states she is unsure if he has had any of these episodes today, which have been going on for a long period of time, because he has been sleeping more today.  She said she was otherwise concerned because he can typically stand from the bed and ambulate to the bathroom by himself, but today required her assistance to help stand and get off the bed and then to ambulate into the restroom.  She also needed to assist him with getting back into bed, which was new.  She states that when he was sitting on the edge of the bed that it seemed as if his head was  "dropping down", but he was responsive to her when she spoke.  Hard to tell because he was sleeping more today. For about 5 minutes he's not really able to respond to you. Needed asstiance  Uses a walk for room to room ambulate.  Sit on the side of the bed and head was "dropping down".   She states that he did not seem quite at his baseline yesterday and was not eating as much, but she did not notice any weakness or other specific changes.  She does state that he has had a cough for the last few days.  Other members of the household did test positive for COVID-19 about 2 weeks ago, but they went to a different household to quarantine.  Both she and the patient tested negative for COVID-19.  No vomiting, diarrhea, shortness of breath, visual changes, chest pain, syncope, lightheadedness, dysuria, rash, leg swelling, numbness, or focal weakness.  Family reports that patient has very mild dementia.  His baseline is to be alert and oriented x3.  He can stand and ambulate from the bed to the bathroom independently.  He uses a walker for room to room ambulation.   The history is provided by the patient and medical records. No language interpreter was used.  Past Medical History:  Diagnosis Date   BPH (benign prostatic hyperplasia)    Cerebrovascular disease    Congenital anomaly of diaphragm    Elevated PSA    Glaucoma, both eyes    Hemorrhoid    Hepatitis B surface antigen positive    02-20-2011   History of adenomatous polyp of colon    2007, 2009 and 2013  tubular adenoma's   History of alcohol abuse    quit 1963   History of cerebral parenchymal hemorrhage    01/ 2006  left occiptial lobe related to hypertensive crisis   History of CVA (cerebrovascular accident)    09-12-2012  left hippocampus/ amygdala junction and per MRI old white matter infarcts--  per pt residual short- term memory issues   History of fatty infiltration of liver hx visit's at [facility] , last visit 05/  2014   elvated LFT's ,  via liver bx 2004 related to hx alcohol and drug abuse (quit 1964)   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   HTN (hypertension)    Renal cyst, left    Stroke ([place])    hx of 3 strokes in past    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    Urethral lesion    urethral mass    Patient Active Problem List   Diagnosis Date Noted   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   Complete heart block ([place])    Heart block    Seizure ([place]) 05/22/2020   Syncope 05/03/2020   History of stroke 05/03/2020   History of supraventricular tachycardia 05/03/2020   Thrombocytopenia ([place]) 05/03/2020   Acute kidney injury ([place]) 05/03/2020   Memory impairment 05/03/2020   Atypical chest pain 01/27/2020   SVT (supraventricular tachycardia) ([place]) 06/03/2017   Dizziness 08/02/2014   Stroke, 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   CVA (cerebral infarction) 09/13/2012   Vertigo 09/12/2012   EAR PAIN 09/05/2009   BACK PAIN 06/20/2009   COLONIC POLYPS 05/06/2007   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   NONDEPENDENT ALCOHOL ABUSE IN REMISSION 05/06/2007   HYPERTENSION, SEVERE 05/06/2007   Hypertensive heart disease without heart failure 05/06/2007   INTRACRANIAL HEMORRHAGE 05/06/2007   Cerebrovascular disease 05/06/2007   HEMORRHOIDS 05/06/2007   INGUINAL HERNIA, RIGHT 05/06/2007   RENAL CYST 05/06/2007   HEMATURIA UNSPECIFIED 05/06/2007   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   PSA, INCREASED 05/06/2007   LIVER FUNCTION TESTS, ABNORMAL 05/06/2007   BPH (benign prostatic hypertrophy) with urinary obstruction 05/06/2007    Past Surgical History:  Procedure Laterality Date   CARDIOVASCULAR STRESS TEST  05/05/2007   normal nuclear study w/ no ischemia/  normal LV fucntion and wall motion , ef60%   COLONOSCOPY  last one 04-06-2012   CYSTO/  LEFT RETROGRADE PYELOGRAM/ CYTOLOGY WASHINGS/  URETEROSCOPY  03/05/2000   INGUINAL HERNIA REPAIR  Bilateral 1965 and 1980's   LAPAROSCOPIC INGUINAL HERNIA WITH UMBILICAL HERNIA Right 67/89/3810   LIVER BIOPSY  1980's and 2004   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   Procedure: PACEMAKER IMPLANT;  Surgeon: Evans Lance, MD;  Location: [facility];  Service: Cardiovascular;  Laterality: N/A;   SVT ABLATION N/A 11/15/2018   Procedure: SVT ABLATION;  Surgeon: Evans Lance, MD;  Location: [facility];  Service: Cardiovascular;  Laterality: N/A;   TRANSTHORACIC ECHOCARDIOGRAM  09/13/2012   moderate LVH,  ef 60-65%/     TRANSURETHRAL RESECTION OF BLADDER TUMOR N/A 08/11/2016  Procedure: TRANSURETHRAL RESECTION OF BLADDER TUMOR (TURBT);  Surgeon: Cleon Gustin, MD;  Location: [facility];  Service: Urology;  Laterality: N/A;       Family History  Problem Relation Age of Onset   Rheum arthritis Mother    Diabetes Mother    Stroke Mother    Heart attack Mother    Kidney failure Mother    Heart attack Father    Heart disease Maternal Grandmother    Rheum arthritis Maternal Grandmother    Diabetes Maternal Grandmother    Stroke Maternal Grandmother    Colon cancer Neg Hx     Social History   Tobacco Use   Smoking status: Former    Packs/day: 1.00    Years: 5.00    Pack years: 5.00    Types: Cigarettes    Quit date: 11/30/1981    Years since quitting: 39.3   Smokeless tobacco: Never  Vaping Use   Vaping Use: Never used  Substance Use Topics   Alcohol use: No    Alcohol/week: 0.0 standard drinks    Comment: hx abuse -- quit:  1963   Drug use: No    Comment: hx abuse -- quit 1964 (iv heroin and cocaine    Home Medications Prior to Admission medications   Medication Sig Start Date End Date Taking? Authorizing Provider  allopurinol (ZYLOPRIM) 100 MG tablet Take 100 mg by mouth daily.   Yes [provider]  amLODipine (NORVASC) 2.5 MG tablet Take 1 tablet (2.5 mg total) by mouth in the morning and at bedtime. 09/28/20 03/31/21 Yes  Lorretta Harp, MD  aspirin EC 81 MG EC tablet Take 1 tablet (81 mg total) by mouth daily. Swallow whole. 06/01/20  Yes Lilland, Alana, DO  atorvastatin (LIPITOR) 40 MG tablet Take 1 tablet by mouth at bedtime. 12/11/20  Yes [provider]  brimonidine (ALPHAGAN) 0.2 % ophthalmic solution Place 1 drop into both eyes 2 (two) times daily.   Yes [provider]  dutasteride (AVODART) 0.5 MG capsule Take 0.5 mg by mouth daily.   Yes [provider]  memantine (NAMENDA) 5 MG tablet Take 17.5 mg by mouth at bedtime.    Yes [provider]  metoprolol succinate (TOPROL-XL) 25 MG 24 hr tablet Take 0.5 tablets (12.5 mg total) by mouth at bedtime. 05/31/20  Yes Lilland, Alana, DO  Nutritional Supplements ([place] PO) Take 1 Bottle by mouth as needed.   Yes [provider]  rivastigmine (EXELON) 1.5 MG capsule Take 1.5 mg by mouth 2 (two) times daily. 02/18/21  Yes [provider]  latanoprost (XALATAN) 0.005 % ophthalmic solution Place 1 drop into both eyes at bedtime.     [provider]    Allergies    Aricept [donepezil], Penicillins, and Viagra [sildenafil]  Review of Systems   Review of Systems  Constitutional:  Positive for fatigue. Negative for appetite change and fever.  HENT:  Negative for congestion and sore throat.   Eyes:  Negative for visual disturbance.  Respiratory:  Negative for shortness of breath.   Cardiovascular:  Negative for chest pain.  Gastrointestinal:  Negative for abdominal pain, diarrhea, nausea and vomiting.  Genitourinary:  Negative for dysuria.  Musculoskeletal:  Negative for back pain, myalgias and neck pain.  Skin:  Negative for rash.  Allergic/Immunologic: Negative for immunocompromised state.  Neurological:  Positive for weakness. Negative for dizziness, seizures, syncope, speech difficulty, light-headedness and headaches.  Psychiatric/Behavioral:  Negative for confusion.  Physical Exam Updated Vital Signs BP (!) 163/99   Pulse 75   Temp 98.1 F (36.7 C) (Oral)   Resp 18   SpO2 100%   Physical Exam Vitals and nursing note reviewed.  Constitutional:      General: He is not in acute distress.    Appearance: He is well-developed. He is not ill-appearing, toxic-appearing or diaphoretic.  HENT:     Head: Normocephalic.  Eyes:     General: No scleral icterus.    Conjunctiva/sclera: Conjunctivae normal.  Cardiovascular:     Rate and Rhythm: Normal rate and regular rhythm.     Pulses: Normal pulses.     Heart sounds: Normal heart sounds. No murmur heard.   No friction rub. No gallop.  Pulmonary:     Effort: Pulmonary effort is normal. No respiratory distress.     Breath sounds: No stridor. No wheezing, rhonchi or rales.  Chest:     Chest wall: No tenderness.  Abdominal:     General: There is no distension.     Palpations: Abdomen is soft. There is no mass.     Tenderness: There is no abdominal tenderness. There is no right CVA tenderness, left CVA tenderness, guarding or rebound.     Hernia: No hernia is present.  Musculoskeletal:        General: No tenderness.     Cervical back: Neck supple.     Right lower leg: No edema.     Left lower leg: No edema.  Skin:    General: Skin is warm and dry.     Coloration: Skin is not jaundiced or pale.     Findings: No bruising or erythema.  Neurological:     Mental Status: He is alert.     Comments: Alert and oriented x3.  Psychiatric:        Behavior: Behavior normal.    ED Results / Procedures / Treatments   Labs (all labs ordered are listed, but only abnormal results are displayed) Labs Reviewed  COMPREHENSIVE METABOLIC PANEL - Abnormal; Notable for the following components:      Result Value   Sodium 134 (*)    CO2 21 (*)    Glucose, Bld 152 (*)    Albumin 3.3 (*)    All other components within normal limits  CBC WITH DIFFERENTIAL/PLATELET - Abnormal; Notable for the following  components:   Platelets 115 (*)    All other components within normal limits  APTT - Abnormal; Notable for the following components:   aPTT 22 (*)    All other components within normal limits  URINALYSIS, ROUTINE W REFLEX MICROSCOPIC - Abnormal; Notable for the following components:   Protein, ur 30 (*)    Leukocytes,Ua MODERATE (*)    Bacteria, UA RARE (*)    All other components within normal limits  CBG MONITORING, ED - Abnormal; Notable for the following components:   Glucose-Capillary 169 (*)    All other components within normal limits  RESP PANEL BY RT-PCR (FLU A&B, COVID) ARPGX2  URINE CULTURE  CULTURE, BLOOD (ROUTINE X 2)  CULTURE, BLOOD (ROUTINE X 2)  LACTIC ACID, PLASMA  PROTIME-INR    EKG None  Radiology CT Head Wo Contrast  Result Date: 03/31/2021 CLINICAL DATA:  [place] EXAM: CT HEAD WITHOUT CONTRAST TECHNIQUE: Contiguous axial images were obtained from the base of the skull through the vertex without intravenous contrast. COMPARISON:  02/28/2020 FINDINGS: Brain: No evidence of acute infarction, hemorrhage, hydrocephalus, extra-axial collection or mass  lesion/mass effect. Global cortical and central atrophy.  Stable ventriculomegaly. Subcortical white matter and periventricular small vessel ischemic changes. Vascular: Mild intracranial atherosclerosis. Skull: Normal. Negative for fracture or focal lesion. Sinuses/Orbits: The visualized paranasal sinuses are essentially clear. The mastoid air cells are unopacified. Other: None. IMPRESSION: No evidence of acute intracranial abnormality. Atrophy with stable ventriculomegaly. Small vessel ischemic changes. Electronically Signed   By: Julian Hy M.D.   On: 03/31/2021 03:12   DG Chest Port 1 View  Result Date: 03/30/2021 CLINICAL DATA:  Questionable sepsis. EXAM: PORTABLE CHEST 1 VIEW COMPARISON:  June 21, 2020 FINDINGS: There is a dual lead AICD. Low lung volumes are noted with stable elevation of the right  hemidiaphragm. There is no evidence of acute infiltrate, pleural effusion or pneumothorax. The heart size and mediastinal contours are within normal limits. The visualized skeletal structures are unremarkable. IMPRESSION: Stable exam without acute or active cardiopulmonary disease. Electronically Signed   By: Virgina Norfolk M.D.   On: 03/30/2021 23:19    Procedures Procedures   Medications Ordered in ED Medications  acetaminophen (TYLENOL) tablet 650 mg (650 mg Oral Given 03/30/21 2256)  sodium chloride 0.9 % bolus 1,000 mL (0 mLs Intravenous Stopped 03/31/21 0527)    ED Course  I have reviewed the triage vital signs and the nursing notes.  Pertinent labs & imaging results that were available during my care of the patient were reviewed by me and considered in my medical decision making (see chart for details).    MDM Rules/Calculators/A&P                           80 year old male with a history of mild dementia.  CVA, alcohol use disorder in remission, BPH, HTN, heart block s/p pacemaker who presents the emergency department by EMS from home with concern for altered mental status.  Vital signs are stable.  The patient was discussed and independently evaluated by Dr. Leonette Monarch, attending physician.  In the ED, the patient has no complaints.  His physical exam is reassuring.  He is alert and oriented x3.  Family was concerned for sleeping most of the day, generalized weakness.  He was found to be febrile when EMS arrived.  Had a rectal temp of 101 in the ED.  Labs and imaging of been reviewed and independently interpreted by me.  No leukocytosis.  Chest x-ray is unremarkable.  Head CT is not demonstrating any acute findings.  He does have very mild hyponatremia bicarb is 21.  Lactate is normal.  COVID-19 test and influenza testing is negative.  We will plan to check UA to ensure he does not have a UTI.  UA is not concerning for infection.  Of note, patient did wait for several hours, but  only had a small amount of urine.  This may be contributing to his very mild hyponatremia.  After 1 L of fluids, patient was able to produce a's small, concentrated urine sample.  This could be contributing to weakness that his wife was describing.  Patient was ambulated with a walker and ambulated at baseline in the department.  Given fever, cultures have been sent.  However, there has been no obvious source of infection.  Could consider viral upper respiratory infection since patient has had a cough for the last few days.  However, the patient has been at his baseline since he has been observed for more than 8 hours in the emergency department.  His work-up  is overall reassuring.  I discussed the patient's work-up and plan of care with his wife.  He can follow-up with his PCP.  He is hemodynamically stable and in no acute distress.  Safer discharge home with outpatient follow-up as discussed.  Final Clinical Impression(s) / ED Diagnoses Final diagnoses:  Fever, unspecified fever cause    Rx / DC Orders ED Discharge Orders     None        Joanne Gavel, PA-C 03/31/21 0713    Fatima Blank, MD 04/04/21 1718

## 2021-04-01 ENCOUNTER — Observation Stay (HOSPITAL_COMMUNITY): Payer: No Typology Code available for payment source

## 2021-04-01 ENCOUNTER — Emergency Department (HOSPITAL_COMMUNITY): Payer: No Typology Code available for payment source

## 2021-04-01 ENCOUNTER — Inpatient Hospital Stay (HOSPITAL_COMMUNITY)
Admission: EM | Admit: 2021-04-01 | Discharge: 2021-04-05 | DRG: 065 | Disposition: A | Discharge status: Home Health | Payer: No Typology Code available for payment source | Attending: Internal Medicine | Admitting: Internal Medicine

## 2021-04-01 DIAGNOSIS — R29701 NIHSS score 1: Secondary | ICD-10-CM | POA: Diagnosis present

## 2021-04-01 DIAGNOSIS — I10 Essential (primary) hypertension: Secondary | ICD-10-CM | POA: Diagnosis present

## 2021-04-01 DIAGNOSIS — R52 Pain, unspecified: Secondary | ICD-10-CM

## 2021-04-01 DIAGNOSIS — Z833 Family history of diabetes mellitus: Secondary | ICD-10-CM

## 2021-04-01 DIAGNOSIS — G309 Alzheimer's disease, unspecified: Secondary | ICD-10-CM | POA: Diagnosis present

## 2021-04-01 DIAGNOSIS — Z79899 Other long term (current) drug therapy: Secondary | ICD-10-CM

## 2021-04-01 DIAGNOSIS — R569 Unspecified convulsions: Secondary | ICD-10-CM

## 2021-04-01 DIAGNOSIS — Z823 Family history of stroke: Secondary | ICD-10-CM

## 2021-04-01 DIAGNOSIS — J9811 Atelectasis: Secondary | ICD-10-CM | POA: Diagnosis not present

## 2021-04-01 DIAGNOSIS — I442 Atrioventricular block, complete: Secondary | ICD-10-CM | POA: Diagnosis present

## 2021-04-01 DIAGNOSIS — D696 Thrombocytopenia, unspecified: Secondary | ICD-10-CM | POA: Diagnosis present

## 2021-04-01 DIAGNOSIS — N401 Enlarged prostate with lower urinary tract symptoms: Secondary | ICD-10-CM | POA: Diagnosis present

## 2021-04-01 DIAGNOSIS — G319 Degenerative disease of nervous system, unspecified: Secondary | ICD-10-CM | POA: Diagnosis not present

## 2021-04-01 DIAGNOSIS — R9431 Abnormal electrocardiogram [ECG] [EKG]: Secondary | ICD-10-CM | POA: Diagnosis present

## 2021-04-01 DIAGNOSIS — I619 Nontraumatic intracerebral hemorrhage, unspecified: Secondary | ICD-10-CM | POA: Diagnosis present

## 2021-04-01 DIAGNOSIS — Z8249 Family history of ischemic heart disease and other diseases of the circulatory system: Secondary | ICD-10-CM

## 2021-04-01 DIAGNOSIS — Z87891 Personal history of nicotine dependence: Secondary | ICD-10-CM

## 2021-04-01 DIAGNOSIS — K760 Fatty (change of) liver, not elsewhere classified: Secondary | ICD-10-CM | POA: Diagnosis present

## 2021-04-01 DIAGNOSIS — R410 Disorientation, unspecified: Secondary | ICD-10-CM | POA: Diagnosis not present

## 2021-04-01 DIAGNOSIS — Z950 Presence of cardiac pacemaker: Secondary | ICD-10-CM

## 2021-04-01 DIAGNOSIS — D6959 Other secondary thrombocytopenia: Secondary | ICD-10-CM | POA: Diagnosis present

## 2021-04-01 DIAGNOSIS — I614 Nontraumatic intracerebral hemorrhage in cerebellum: Principal | ICD-10-CM | POA: Diagnosis present

## 2021-04-01 DIAGNOSIS — N138 Other obstructive and reflux uropathy: Secondary | ICD-10-CM | POA: Diagnosis present

## 2021-04-01 DIAGNOSIS — I639 Cerebral infarction, unspecified: Secondary | ICD-10-CM | POA: Diagnosis present

## 2021-04-01 DIAGNOSIS — Z8673 Personal history of transient ischemic attack (TIA), and cerebral infarction without residual deficits: Secondary | ICD-10-CM

## 2021-04-01 DIAGNOSIS — R0902 Hypoxemia: Secondary | ICD-10-CM | POA: Diagnosis not present

## 2021-04-01 DIAGNOSIS — R4182 Altered mental status, unspecified: Secondary | ICD-10-CM | POA: Diagnosis not present

## 2021-04-01 DIAGNOSIS — F0150 Vascular dementia without behavioral disturbance: Secondary | ICD-10-CM | POA: Diagnosis present

## 2021-04-01 DIAGNOSIS — E785 Hyperlipidemia, unspecified: Secondary | ICD-10-CM | POA: Diagnosis present

## 2021-04-01 DIAGNOSIS — E854 Organ-limited amyloidosis: Secondary | ICD-10-CM | POA: Diagnosis present

## 2021-04-01 DIAGNOSIS — Z7982 Long term (current) use of aspirin: Secondary | ICD-10-CM

## 2021-04-01 DIAGNOSIS — R509 Fever, unspecified: Secondary | ICD-10-CM | POA: Diagnosis present

## 2021-04-01 DIAGNOSIS — N179 Acute kidney failure, unspecified: Secondary | ICD-10-CM | POA: Diagnosis present

## 2021-04-01 DIAGNOSIS — M10061 Idiopathic gout, right knee: Secondary | ICD-10-CM | POA: Diagnosis present

## 2021-04-01 DIAGNOSIS — F0280 Dementia in other diseases classified elsewhere without behavioral disturbance: Secondary | ICD-10-CM | POA: Diagnosis present

## 2021-04-01 DIAGNOSIS — Z20822 Contact with and (suspected) exposure to covid-19: Secondary | ICD-10-CM | POA: Diagnosis present

## 2021-04-01 LAB — CBG MONITORING, ED: Glucose-Capillary: 120 mg/dL — ABNORMAL HIGH (ref 70–99)

## 2021-04-01 LAB — URINALYSIS, ROUTINE W REFLEX MICROSCOPIC
Bilirubin Urine: NEGATIVE
Glucose, UA: NEGATIVE mg/dL
Hgb urine dipstick: NEGATIVE
Ketones, ur: NEGATIVE mg/dL
Nitrite: NEGATIVE
Protein, ur: 100 mg/dL — AB
Specific Gravity, Urine: 1.023 (ref 1.005–1.030)
pH: 5 (ref 5.0–8.0)

## 2021-04-01 LAB — COMPREHENSIVE METABOLIC PANEL
ALT: 23 U/L (ref 0–44)
AST: 27 U/L (ref 15–41)
Albumin: 3.3 g/dL — ABNORMAL LOW (ref 3.5–5.0)
Alkaline Phosphatase: 67 U/L (ref 38–126)
Anion gap: 9 (ref 5–15)
BUN: 18 mg/dL (ref 8–23)
CO2: 25 mmol/L (ref 22–32)
Calcium: 9.4 mg/dL (ref 8.9–10.3)
Chloride: 102 mmol/L (ref 98–111)
Creatinine, Ser: 1.32 mg/dL — ABNORMAL HIGH (ref 0.61–1.24)
GFR, Estimated: 55 mL/min — ABNORMAL LOW (ref >60)
Glucose, Bld: 115 mg/dL — ABNORMAL HIGH (ref 70–99)
Potassium: 4.3 mmol/L (ref 3.5–5.1)
Sodium: 136 mmol/L (ref 135–145)
Total Bilirubin: 1.7 mg/dL — ABNORMAL HIGH (ref 0.3–1.2)
Total Protein: 6.9 g/dL (ref 6.5–8.1)

## 2021-04-01 LAB — RAPID URINE DRUG SCREEN, HOSP PERFORMED
Amphetamines: NOT DETECTED
Barbiturates: NOT DETECTED
Benzodiazepines: NOT DETECTED
Cocaine: NOT DETECTED
Opiates: NOT DETECTED
Tetrahydrocannabinol: NOT DETECTED

## 2021-04-01 LAB — CBC WITH DIFFERENTIAL/PLATELET
Abs Immature Granulocytes: 0.02 10*3/uL (ref 0.00–0.07)
Basophils Absolute: 0 10*3/uL (ref 0.0–0.1)
Basophils Relative: 0 %
Eosinophils Absolute: 0 10*3/uL (ref 0.0–0.5)
Eosinophils Relative: 0 %
HCT: 46.6 % (ref 39.0–52.0)
Hemoglobin: 15.7 g/dL (ref 13.0–17.0)
Immature Granulocytes: 0 %
Lymphocytes Relative: 12 %
Lymphs Abs: 0.7 10*3/uL (ref 0.7–4.0)
MCH: 31.4 pg (ref 26.0–34.0)
MCHC: 33.7 g/dL (ref 30.0–36.0)
MCV: 93.2 fL (ref 80.0–100.0)
Monocytes Absolute: 0.6 10*3/uL (ref 0.1–1.0)
Monocytes Relative: 10 %
Neutro Abs: 4.9 10*3/uL (ref 1.7–7.7)
Neutrophils Relative %: 78 %
Platelets: 111 10*3/uL — ABNORMAL LOW (ref 150–400)
RBC: 5 MIL/uL (ref 4.22–5.81)
RDW: 13.6 % (ref 11.5–15.5)
WBC: 6.3 10*3/uL (ref 4.0–10.5)
nRBC: 0 % (ref 0.0–0.2)

## 2021-04-01 LAB — URINE CULTURE

## 2021-04-01 LAB — LACTIC ACID, PLASMA: Lactic Acid, Venous: 1.6 mmol/L (ref 0.5–1.9)

## 2021-04-01 LAB — RESP PANEL BY RT-PCR (FLU A&B, COVID) ARPGX2
Influenza A by PCR: NEGATIVE
Influenza B by PCR: NEGATIVE
SARS Coronavirus 2 by RT PCR: NEGATIVE

## 2021-04-01 LAB — ETHANOL: Alcohol, Ethyl (B): <10 mg/dL (ref <10)

## 2021-04-01 MED ORDER — ACETAMINOPHEN 160 MG/5ML PO SOLN: 650.0000 mg | ORAL | Status: DC | PRN

## 2021-04-01 MED ORDER — SODIUM CHLORIDE 0.9 % IV SOLN
1.0000 g | Freq: Once | INTRAVENOUS | Status: AC
  Administered 2021-04-01: 1 g via INTRAVENOUS
  Filled 2021-04-01: qty 10

## 2021-04-01 MED ORDER — SENNOSIDES-DOCUSATE SODIUM 8.6-50 MG PO TABS: 1.0000 | ORAL_TABLET | Freq: Every evening | ORAL | Status: DC | PRN

## 2021-04-01 MED ORDER — LABETALOL HCL 5 MG/ML IV SOLN: 10.0000 mg | INTRAVENOUS | Status: DC | PRN

## 2021-04-01 MED ORDER — DUTASTERIDE 0.5 MG PO CAPS
0.5000 mg | ORAL_CAPSULE | Freq: Every day | ORAL | Status: DC
  Administered 2021-04-02 – 2021-04-05 (×4): 0.5 mg via ORAL
  Filled 2021-04-01 (×5): qty 1

## 2021-04-01 MED ORDER — TAMSULOSIN HCL 0.4 MG PO CAPS
0.4000 mg | ORAL_CAPSULE | Freq: Every day | ORAL | Status: DC
  Administered 2021-04-01 – 2021-04-04 (×4): 0.4 mg via ORAL
  Filled 2021-04-01 (×4): qty 1

## 2021-04-01 MED ORDER — METOPROLOL SUCCINATE ER 25 MG PO TB24
12.5000 mg | ORAL_TABLET | Freq: Every day | ORAL | Status: DC
  Administered 2021-04-01 – 2021-04-04 (×4): 12.5 mg via ORAL
  Filled 2021-04-01 (×4): qty 1

## 2021-04-01 MED ORDER — LATANOPROST 0.005 % OP SOLN
1.0000 [drp] | Freq: Every day | OPHTHALMIC | Status: DC
  Administered 2021-04-02 – 2021-04-04 (×3): 1 [drp] via OPHTHALMIC
  Filled 2021-04-01: qty 2.5

## 2021-04-01 MED ORDER — ACETAMINOPHEN 650 MG RE SUPP: 650.0000 mg | RECTAL | Status: DC | PRN

## 2021-04-01 MED ORDER — SODIUM CHLORIDE 0.9 % IV SOLN: INTRAVENOUS | Status: AC

## 2021-04-01 MED ORDER — MEMANTINE HCL 10 MG PO TABS
20.0000 mg | ORAL_TABLET | Freq: Every day | ORAL | Status: DC
  Administered 2021-04-01 – 2021-04-04 (×4): 20 mg via ORAL
  Filled 2021-04-01 (×4): qty 2

## 2021-04-01 MED ORDER — ENSURE ACTIVE HEART HEALTH PO LIQD: ORAL | Status: DC | PRN

## 2021-04-01 MED ORDER — IOHEXOL 350 MG/ML SOLN
75.0000 mL | Freq: Once | INTRAVENOUS | Status: AC | PRN
  Administered 2021-04-01: 75 mL via INTRAVENOUS

## 2021-04-01 MED ORDER — STROKE: EARLY STAGES OF RECOVERY BOOK
Freq: Once | Status: AC
  Filled 2021-04-01: qty 1

## 2021-04-01 MED ORDER — BRIMONIDINE TARTRATE 0.2 % OP SOLN
1.0000 [drp] | Freq: Two times a day (BID) | OPHTHALMIC | Status: DC
  Administered 2021-04-02 – 2021-04-05 (×7): 1 [drp] via OPHTHALMIC
  Filled 2021-04-01: qty 5

## 2021-04-01 MED ORDER — AMLODIPINE BESYLATE 2.5 MG PO TABS
2.5000 mg | ORAL_TABLET | Freq: Two times a day (BID) | ORAL | Status: DC
  Administered 2021-04-01 – 2021-04-05 (×8): 2.5 mg via ORAL
  Filled 2021-04-01 (×8): qty 1

## 2021-04-01 MED ORDER — ATORVASTATIN CALCIUM 40 MG PO TABS
40.0000 mg | ORAL_TABLET | Freq: Every day | ORAL | Status: DC
  Administered 2021-04-02: 40 mg via ORAL
  Filled 2021-04-01: qty 1

## 2021-04-01 MED ORDER — ACETAMINOPHEN 325 MG PO TABS
650.0000 mg | ORAL_TABLET | ORAL | Status: DC | PRN
  Administered 2021-04-04: 650 mg via ORAL
  Filled 2021-04-01: qty 2

## 2021-04-01 MED ORDER — LACTATED RINGERS IV BOLUS
1000.0000 mL | Freq: Once | INTRAVENOUS | Status: AC
  Administered 2021-04-01: 1000 mL via INTRAVENOUS

## 2021-04-01 NOTE — Progress Notes (Signed)
EEG complete - results pending 

## 2021-04-01 NOTE — ED Provider Notes (Signed)
Signout note  80 year old gentleman with concern for difficulty walking and fever.  CT with possible cerebellar stroke.  UA with UTI and started on Rocephin.  MRI ordered to further evaluate.  Anticipate admission after MRI, possibly reconsult neurology.  MRI with acute microhemorrhage within the left cerebellum, either hemorrhagic infarct or primary microhemorrhage.  Discussed with [facility] with neurology, he recommends hospitalist admission, holding antiplatelet agent for now, SBP goal less than 160 mmHg, he will see as consult.  Placed consult to unassigned admit.   Lucrezia Starch, MD 04/01/21 [phone]

## 2021-04-01 NOTE — ED Notes (Addendum)
Pt to MRI with RN, blood draw attempt unsuccessful for blood culture and lactate.

## 2021-04-01 NOTE — Consult Note (Addendum)
Neurology Consultation  Reason for Consult: MRI with acute microhemorrhage within the left cerebellum; hemorrhagic infarction versus primary microhemorrhage Referring Physician: Dr. Rogers Blocker  CC: Trouble walking  History is obtained from: Chart review, Patient's wife at bedside, Patient does not provide a clear or coherent history of present illness due to baseline mental status  HPI: Dustin Hamilton is a 80 y.o. male with a medical history significant for vascular dementia, essential hypertension, left occipital hemorrhage 06/2004, ischemic strokes 2022, 2020, and 2014, former alcohol use, and cerebral microbleeds who initially presented to the ED 10/29 for evaluation of fever at home and difficulty walking. He was discharged early yesterday morning on 10/30 but continued to have persistent fevers with diaphoresis, increased confusion from baseline, and inability to get up out of the bed due to weakness and dizziness with standing. MRI brain imaging was obtained revealing an acute microhemorrhage within the left cerebellum and neurology was consulted for further evaluation.   Per chart review, patient follows with outpatient neurology with [facility] for spells of impaired responsiveness and dizziness. These spells started in 2018 and occurred once every few months with gradual increase in frequency. He was started on Keppra by his [facility] provider after witnessing one of these spells with concern for partial seizures. The spells are described as "patient staring off, not responding, unable to speak or hear you" for about 5 minutes at a time and these have been occurring daily and multiple times daily for 3-4 months. At his last visit with [facility] neurology, his Keppra was discontinued 9/15 due to increasing patient lethargy and without improvement in patient's spells. A witnessed spell was documented during his most recent visit described as "he suddenly became quite and unresponsive and had eyes  partially open and was leaning lasting <2 min and then resolved. No recollection and wanted to lay down. BP was checked a few minutes after event and was 125/86." Patient is amnestic to the spells and often is able to identify when they are about to occur. He has had multiple EEGs including 12/23-12/26 with capturing spells without concomitant electrographic changes and again in June of 2022 with non-epileptic events captured.   LKW: 10/28 prior to going to bed TNK given?: no, microhemorrhage on MRI imaging IR Thrombectomy? No, presentation is not consistent with an LVO Modified Rankin Scale: 3-Moderate disability-requires help but walks WITHOUT assistance ICH score: 2    ROS: Unable to obtain due to altered mental status, patient is a poor historian.  Past Medical History:  Diagnosis Date   BPH (benign prostatic hyperplasia)    Cerebrovascular disease    Congenital anomaly of diaphragm    Elevated PSA    Glaucoma, both eyes    Hemorrhoid    Hepatitis B surface antigen positive    02-20-2011   History of adenomatous polyp of colon    2007, 2009 and 2013  tubular adenoma's   History of alcohol abuse    quit 1963   History of cerebral parenchymal hemorrhage    01/ 2006  left occiptial lobe related to hypertensive crisis   History of CVA (cerebrovascular accident)    09-12-2012  left hippocampus/ amygdala junction and per MRI old white matter infarcts--  per pt residual short- term memory issues   History of fatty infiltration of liver hx visit's at [facility] , last visit 05/ 2014   elvated LFT's ,  via liver bx 2004 related to hx alcohol and drug abuse (quit 1964)  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   HTN (hypertension)    Renal cyst, left    Stroke ([place])    hx of 3 strokes in past    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    Urethral lesion    urethral mass   Past Surgical History:  Procedure Laterality Date    CARDIOVASCULAR STRESS TEST  05/05/2007   normal nuclear study w/ no ischemia/  normal LV fucntion and wall motion , ef60%   COLONOSCOPY  last one 04-06-2012   CYSTO/  LEFT RETROGRADE PYELOGRAM/ CYTOLOGY WASHINGS/  URETEROSCOPY  03/05/2000   INGUINAL HERNIA REPAIR Bilateral 1965 and 1980's   LAPAROSCOPIC INGUINAL HERNIA WITH UMBILICAL HERNIA Right 41/28/7867   LIVER BIOPSY  1980's and 2004   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   Procedure: PACEMAKER IMPLANT;  Surgeon: Evans Lance, MD;  Location: [facility];  Service: Cardiovascular;  Laterality: N/A;   SVT ABLATION N/A 11/15/2018   Procedure: SVT ABLATION;  Surgeon: Evans Lance, MD;  Location: [place];  Service: Cardiovascular;  Laterality: N/A;   TRANSTHORACIC ECHOCARDIOGRAM  09/13/2012   moderate LVH,  ef 60-65%/     TRANSURETHRAL RESECTION OF BLADDER TUMOR N/A 08/11/2016   Procedure: TRANSURETHRAL RESECTION OF BLADDER TUMOR (TURBT);  Surgeon: Cleon Gustin, MD;  Location[facility];  Service: Urology;  Laterality: N/A;   Family History  Problem Relation Age of Onset   Rheum arthritis Mother    Diabetes Mother    Stroke Mother    Heart attack Mother    Kidney failure Mother    Heart attack Father    Heart disease Maternal Grandmother    Rheum arthritis Maternal Grandmother    Diabetes Maternal Grandmother    Stroke Maternal Grandmother    Colon cancer Neg Hx    Social History:   reports that he quit smoking about 39 years ago. His smoking use included cigarettes. He has a 5.00 pack-year smoking history. He has never used smokeless tobacco. He reports that he does not drink alcohol and does not use drugs.  Medications  Current Facility-Administered Medications:    labetalol (NORMODYNE) injection 10 mg, 10 mg, Intravenous, Q2H PRN, Roslynn Amble, Ellwood Dense, MD  Current Outpatient Medications:    amLODipine (NORVASC) 2.5 MG tablet, Take 1 tablet (2.5 mg total) by mouth in the morning and  at bedtime., Disp: 90 tablet, Rfl: 3   aspirin EC 81 MG EC tablet, Take 1 tablet (81 mg total) by mouth daily. Swallow whole., Disp: 30 tablet, Rfl: 11   atorvastatin (LIPITOR) 40 MG tablet, Take 1 tablet by mouth daily., Disp: , Rfl:    brimonidine (ALPHAGAN) 0.2 % ophthalmic solution, Place 1 drop into both eyes 2 (two) times daily., Disp: , Rfl:    dutasteride (AVODART) 0.5 MG capsule, Take 0.5 mg by mouth daily., Disp: , Rfl:    latanoprost (XALATAN) 0.005 % ophthalmic solution, Place 1 drop into both eyes at bedtime. , Disp: , Rfl:    memantine (NAMENDA) 10 MG tablet, Take 20 mg by mouth at bedtime., Disp: , Rfl:    metoprolol succinate (TOPROL-XL) 25 MG 24 hr tablet, Take 0.5 tablets (12.5 mg total) by mouth at bedtime., Disp: 30 tablet, Rfl: 1   Naphazoline HCl (CLEAR EYES OP), Apply 2 drops to eye daily as needed (dry eyes)., Disp: , Rfl:    Nutritional Supplements (ENSURE ACTIVE  HEART HEALTH PO), Take 1 Bottle by mouth as needed (appetite, nutrition)., Disp: , Rfl:    tamsulosin (FLOMAX) 0.4 MG CAPS capsule, Take 0.4 mg by mouth at bedtime., Disp: , Rfl:    levETIRAcetam (KEPPRA) 250 MG tablet, Take 125-375 mg by mouth See admin instructions. Take one-half tablet by mouth three times a day for 2 weeks, then take one tablet three times a day for 4 weeks, then take one and one-half tablets three times a day for partial seizures., Disp: , Rfl:   Exam: Current vital signs: BP (!) 165/94 (BP Location: Right Arm)   Pulse 91   Temp (!) 100.5 F (38.1 C) (Oral)   Resp (!) 24   SpO2 93%  Vital signs in last 24 hours: Temp:  [98.7 F (37.1 C)-100.5 F (38.1 C)] 100.5 F (38.1 C) (10/31 1548) Pulse Rate:  [73-91] 91 (10/31 1548) Resp:  [16-24] 24 (10/31 1548) BP: (119-165)/(79-116) 165/94 (10/31 1548) SpO2:  [93 %-97 %] 93 % (10/31 1548)  GENERAL: Awake, alert, in no acute distress Psych: Patient is calm and cooperative with examination Head: Normocephalic and atraumatic, without  obvious abnormality EENT: Normal conjunctivae, dry mucous membranes, no OP obstruction. LUNGS: Normal respiratory effort. Non-labored breathing on room air. SpO2 94% on telemetry CV: Regular rate and rhythm on telemetry ABDOMEN: Soft, non-tender, non-distended Extremities: warm, well perfused, without obvious deformity  NEURO:  Mental Status: Awake, alert, and oriented to self, year, and place. He is unable to state the current month and looks to his wife for the correct answer. He states that the month is December. He does not elaborate on history of present illness when asked.  Speech/Language: speech is intact without dysarthria.  Naming partially intact though he is unable to identify examiner's thumb or little finger he states "index finger". No neglect is noted. Cranial Nerves:  II: Right pupil is regular, round, 3 mm and briskly reactive to light, left pupil is ovoid, irregular, and briskly reactive to light III, IV, VI: EOMI without ptosis or gaze preference. V: Sensation is intact to light touch and symmetrical to face. VII: Face is symmetric resting and smiling.   VIII: Hearing is intact to voice IX, X: Palate elevation is symmetric. Phonation normal.  XI: Normal sternocleidomastoid and trapezius muscle strength XII: Tongue protrudes midline without fasciculations.   Motor: 5/5 strength present in bilateral upper extremities.  Bilateral lower extremities 4+/5 strength without vertical drift. Tone is normal. Bulk is normal.  Sensation: Intact to light touch bilaterally in all four extremities. No extinction to DSS present.  Coordination: FTN intact bilaterally. HKS intact bilaterally. DTRs: 2+ and symmetric throughout.  Gait: Deferred  NIHSS: 1a Level of Conscious.: 0 1b LOC Questions: 1 1c LOC Commands: 0 2 Best Gaze: 0 3 Visual: 0 4 Facial Palsy: 0 5a Motor Arm - left: 0 5b Motor Arm - Right: 0 6a Motor Leg - Left: 0 6b Motor Leg - Right: 0 7 Limb Ataxia: 0 8  Sensory: 0 9 Best Language: 0 10 Dysarthria: 0 11 Extinct. and Inatten.: 0 TOTAL: 1  Labs I have reviewed labs in epic and the results pertinent to this consultation are: CBC    Component Value Date/Time   WBC 6.3 04/01/2021 1015   RBC 5.00 04/01/2021 1015   HGB 15.7 04/01/2021 1015   HGB 17.2 11/12/2018 0950   HCT 46.6 04/01/2021 1015   HCT 52.0 (H) 11/12/2018 0950   PLT 111 (L) 04/01/2021 1015   PLT 144 (L)  11/12/2018 0950   MCV 93.2 04/01/2021 1015   MCV 92 11/12/2018 0950   MCH 31.4 04/01/2021 1015   MCHC 33.7 04/01/2021 1015   RDW 13.6 04/01/2021 1015   RDW 13.7 11/12/2018 0950   LYMPHSABS 0.7 04/01/2021 1015   LYMPHSABS 1.3 11/12/2018 0950   MONOABS 0.6 04/01/2021 1015   EOSABS 0.0 04/01/2021 1015   EOSABS 0.1 11/12/2018 0950   BASOSABS 0.0 04/01/2021 1015   BASOSABS 0.0 11/12/2018 0950   CMP     Component Value Date/Time   NA 136 04/01/2021 1015   NA 143 11/12/2018 0950   K 4.3 04/01/2021 1015   CL 102 04/01/2021 1015   CO2 25 04/01/2021 1015   GLUCOSE 115 (H) 04/01/2021 1015   BUN 18 04/01/2021 1015   BUN 15 11/12/2018 0950   CREATININE 1.32 (H) 04/01/2021 1015   CALCIUM 9.4 04/01/2021 1015   PROT 6.9 04/01/2021 1015   ALBUMIN 3.3 (L) 04/01/2021 1015   AST 27 04/01/2021 1015   ALT 23 04/01/2021 1015   ALKPHOS 67 04/01/2021 1015   BILITOT 1.7 (H) 04/01/2021 1015   GFRNONAA 55 (L) 04/01/2021 1015   GFRAA 60 (L) 02/28/2020 1152   Lipid Panel     Component Value Date/Time   CHOL 123 09/13/2012 0330   TRIG 112 09/13/2012 0330   HDL 25 (L) 09/13/2012 0330   CHOLHDL 4.9 09/13/2012 0330   VLDL 22 09/13/2012 0330   LDLCALC 76 09/13/2012 0330   Lab Results  Component Value Date   HGBA1C 6.2 (H) 09/13/2012   Imaging I have reviewed the images obtained:  CT-scan of the brain 10/31: Background pattern of generalized atrophy with extensive chronic small-vessel ischemic changes throughout the cerebral hemispheric white matter and thalami. I think  there is an acute or subacute left cerebellar infarction with minimal petechial blood products. This does not appear visibly changed since yesterday.  MRI examination of the brain 10/31: - Acute microhemorrhage within the left cerebellum, corresponding to the CT finding. This could either represent a hemorrhagic infarction or a primary microhemorrhage. - Extensive chronic small-vessel ischemic changes affect the cerebral hemispheres, many of the insults associated with hemosiderin deposition. - Chronic white matter volume loss with ventriculomegaly but no suspicion of obstructive hydrocephalus.  Assessment: 80 y.o. male with 2 presentations in the past 2 days for evaluation of fevers and weakness impairing his ambulation; also with reports of "spells" where he stares off, is unable to speak, and wants to rest afterward.  - Examination reveals patient that appears confused but without focal neurologic deficit. Patient has dementia at baseline.  - CT imaging complete with concern for acute/subacute left cerebellar infarction with minimal petechial blood products. MRI imaging reveals an acute microhemorrhage within the left cerebellum representing either a hemorrhagic infarction or a primary microhemorrhage.  - Outpatient neurology work ups with concern for CAA versus seizures. Current presentation may be 2/2 CAA with microhemorrhage versus hemorrhagic infarction but has had episodes concerning for amyloid spells.  Impression - Acute ischemic infarction with hemorrhagic transformation versus acute/subacute microhemorrhage in the left cerebellum - Question cerebral amyloid angiopathy and amyloid spells - Rule out seizures   Recommendations: (not ordered) - Routine EEG - HgbA1c, fasting lipid panel - CT angio head and neck - Frequent neuro checks - Echocardiogram - Maintain systolic blood pressure < 160 mmHg - Hold antiplatelet and anticoagulation medications - Risk factor modification -  Telemetry monitoring - PT consult, OT consult, Speech consult - Stroke team to follow  Pt seen by NP/Neuro and later by MD. Note/plan to be edited by MD as needed.  Anibal Henderson, AGAC-NP [facility] Pager: [phone]  Attending addendum Patient seen and examined Imaging reviewed personally Imaging findings consistent with possible chronic microhemorrhages-? CAA. Clinical events suspected amyloid spells. Rule out seizures Too sedated with Keppra-has been discontinued. Decision on antiepileptics to be made if there is compelling clinical and electrographic reason to start or if the events become more frequent. As per the small acute/subacute infarction with hemorrhagic transformation versus acute/subacute microhemorrhage involving the left cerebellum-recommendations as above. Strict blood pressure control with systolic blood pressure goal of less than 160-admission to stepdown under hospitalist. Stroke team will follow with you.  -- Amie [place], MD Neurologist [facility] Pager: [phone]

## 2021-04-01 NOTE — ED Notes (Signed)
Pt remains in MRI 

## 2021-04-01 NOTE — ED Notes (Signed)
Pt has some shakiness in his R arm, pt awake and talking, pt warm to the touch. Md at bedside, states tremor, requests rectal temp.

## 2021-04-01 NOTE — ED Notes (Signed)
Pt back from MRI 

## 2021-04-01 NOTE — ED Provider Notes (Signed)
Emergency Medicine Provider Triage Evaluation Note  Dustin Hamilton , a 80 y.o. male  was evaluated in triage.  Pt complains of weakness and slowed mentation with difficulty finding words since discharge yesterday at 7 AM.  No worsening or change in symptoms since then.  Review of Systems  Positive: Weakness, difficulty finding words Negative: Chest pain, shortness of breath, fever, abdominal pain  Physical Exam  BP 119/79 (BP Location: Right Arm)   Pulse 73   Temp 98.7 F (37.1 C) (Oral)   Resp 16   SpO2 96%  Gen:   Awake, no distress   Resp:  Normal effort  MSK:   Moves extremities without difficulty  Other:  Patient with symmetrical strength throughout, cranial nerves 2-12 intact.  Alert and oriented x3.  - Medical Decision Making  Medically screening exam initiated at 10:10 AM.  Appropriate orders placed.  [name] was informed that the remainder of the evaluation will be completed by another provider, this initial triage assessment does not replace that evaluation, and the importance of remaining in the ED until their evaluation is complete.     Evlyn Courier, PA-C 04/01/21 [place], [place], [place] 04/02/21 [phone]

## 2021-04-01 NOTE — ED Notes (Signed)
Patient transported to CT 

## 2021-04-01 NOTE — H&P (Signed)
History and Physical    Dustin Hamilton RSW:[id number] DOB: 1941-04-29 DOA: 04/01/2021  PCP: [facility]:  cardiology: Dr. Gwenlyn Found, neurologist: [facility]  [facility]: Dr. Primus Bravo and dr. Arlyn Leak. endocrinology: Dr. Janese Banks, EP: Dr. Lovena Le  Patient coming from:  Home - lives with wife   Chief Complaint: fever and trouble walking.   History from wife.   HPI: Dustin Hamilton is a 80 y.o. male with medical history significant of vascular dementia, intermittent symptomatic hypotension with dizziness, cerebral microbleeds, hypertension, history of strokes-last this year, history of left occipital hemorrhage in 2006, seizure, BPH, complete heart block status post pacemaker, thrombocytopenia presenting to ED with complaints of trouble walking and weakness. He originally came to ED on 10/29 with complaints of not eating, difficulty walking and fever to 102.5. he was discharged on 10/30. His wife states his fever persisted he had slurred speech and he was unable to get up and down. He also has had loss of appetite. He has also had dizziness mainly with positional changes. This has been ongoing since 2018. He is able to walk with a walker at baseline. His wife states he is not confused and is at his baseline mentation.   He denies any headaches, chest pain, palpitations, shortness of breath, cough, abdominal pain, N/V/D. He denies any dysuria, urgency, frequency. No leg swelling, skin changes.   Seen on 01/2021 by neurologist at [facility] Has had "spells: Where he has impaired responsiveness and dizziness.  Was witnessed at this visit by neurologist.  He was previously on [place] however this was discontinued due to reported dizziness and inability to function.  He is currently on no AEDs. Per note: Of note, he was admitted for further evaluation of these events in December 2021 at OSH. He underwent cEEG monitoring from 12/23-12/26 according to the discharge summary with multiple  events reportedly captured without electrographic change to indicate ictal activity; however, only the first day's report is available for review which did not have events noted. He also had pacemaker on 05/28/20 after found to have heart block but no improvement in events.  -he had EMU done in 10/2020 with events recorded and non-epileptic per report    ED Course: vitals: Afebrile at time of admit then had 100.5 fever, blood pressure 156/98, heart rate 76, respiratory rate 23, oxygen 96% on room air. Pertinent labs: platelets 111, creatinine 1.32,  Chest x-ray: No acute process.  CT head: Radiologist read as possible acute or subacute left cerebellar infarction with minimal petechial blood products. MRI brain shows acute microhemorrhage within the left cerebellum could represent either hemorrhagic infarction or a primary microhemorrhage.  He also has extensive chronic small vessel ischemic changes that affect the cerebral hemispheres many of the insults associated with hemosiderin deposition. In Ed given rocephin, 1L bolus and neurology was consulted. We were asked to admit.   Review of Systems: As per HPI; otherwise review of systems reviewed and negative.   Ambulatory Status:  Ambulates with walker.     Past Medical History:  Diagnosis Date   BPH (benign prostatic hyperplasia)    Cerebrovascular disease    Congenital anomaly of diaphragm    Elevated PSA    Glaucoma, both eyes    Hemorrhoid    Hepatitis B surface antigen positive    02-20-2011   History of adenomatous polyp of colon    2007, 2009 and 2013  tubular adenoma's   History of alcohol abuse    quit 1963  History of cerebral parenchymal hemorrhage    01/ 2006  left occiptial lobe related to hypertensive crisis   History of CVA (cerebrovascular accident)    09-12-2012  left hippocampus/ amygdala junction and per MRI old white matter infarcts--  per pt residual short- term memory issues   History of fatty infiltration of  liver hx visit's at [facility] , last visit 05/ 2014   elvated LFT's ,  via liver bx 2004 related to hx alcohol and drug abuse (quit 1964)   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   HTN (hypertension)    Renal cyst, left    Stroke ([place])    hx of 3 strokes in past    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    Urethral lesion    urethral mass    Past Surgical History:  Procedure Laterality Date   CARDIOVASCULAR STRESS TEST  05/05/2007   normal nuclear study w/ no ischemia/  normal LV fucntion and wall motion , ef60%   COLONOSCOPY  last one 04-06-2012   CYSTO/  LEFT RETROGRADE PYELOGRAM/ CYTOLOGY WASHINGS/  URETEROSCOPY  03/05/2000   INGUINAL HERNIA REPAIR Bilateral 1965 and 1980's   LAPAROSCOPIC INGUINAL HERNIA WITH UMBILICAL HERNIA Right 40/03/2724   LIVER BIOPSY  1980's and 2004   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   Procedure: PACEMAKER IMPLANT;  Surgeon: Evans Lance, MD;  Location: [facility];  Service: Cardiovascular;  Laterality: N/A;   SVT ABLATION N/A 11/15/2018   Procedure: SVT ABLATION;  Surgeon: Evans Lance, MD;  Location: [facility];  Service: Cardiovascular;  Laterality: N/A;   TRANSTHORACIC ECHOCARDIOGRAM  09/13/2012   moderate LVH,  ef 60-65%/     TRANSURETHRAL RESECTION OF BLADDER TUMOR N/A 08/11/2016   Procedure: TRANSURETHRAL RESECTION OF BLADDER TUMOR (TURBT);  Surgeon: Cleon Gustin, MD;  Location: [facility];  Service: Urology;  Laterality: N/A;    Social History   Socioeconomic History   Marital status: Married    Spouse name: [name]   Number of children: 1   Years of education: College   Highest education level: Not on file  Occupational History   Occupation: Geophysicist/field seismologist  Tobacco Use   Smoking status: Former    Packs/day: 1.00    Years: 5.00    Pack years: 5.00    Types: Cigarettes    Quit date: 11/30/1981    Years since quitting: 39.3   Smokeless  tobacco: Never  Vaping Use   Vaping Use: Never used  Substance and Sexual Activity   Alcohol use: No    Alcohol/week: 0.0 standard drinks    Comment: hx abuse -- quit:  1963   Drug use: No    Comment: 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   Caffeine Use:  Tea, lots   Social Determinants of Health   Financial Resource Strain: Not on file  Food Insecurity: Not on file  Transportation Needs: Not on file  Physical Activity: Not on file  Stress: Not on file  Social Connections: Not on file  Intimate Partner Violence: Not on file    Allergies  Allergen Reactions   Aricept [Donepezil]     dizziness   Penicillins Hives   Viagra [Sildenafil]  dizziness    Family History  Problem Relation Age of Onset   Rheum arthritis Mother    Diabetes Mother    Stroke Mother    Heart attack Mother    Kidney failure Mother    Heart attack Father    Heart disease Maternal Grandmother    Rheum arthritis Maternal Grandmother    Diabetes Maternal Grandmother    Stroke Maternal Grandmother    Colon cancer Neg Hx     Prior to Admission medications   Medication Sig Start Date End Date Taking? Authorizing Provider  amLODipine (NORVASC) 2.5 MG tablet Take 1 tablet (2.5 mg total) by mouth in the morning and at bedtime. 09/28/20 04/01/21 Yes Lorretta Harp, MD  aspirin EC 81 MG EC tablet Take 1 tablet (81 mg total) by mouth daily. Swallow whole. 06/01/20  Yes Lilland, Alana, DO  atorvastatin (LIPITOR) 40 MG tablet Take 1 tablet by mouth daily. 12/11/20  Yes [provider]  brimonidine (ALPHAGAN) 0.2 % ophthalmic solution Place 1 drop into both eyes 2 (two) times daily.   Yes [provider]  dutasteride (AVODART) 0.5 MG capsule Take 0.5 mg by mouth daily.   Yes [provider]  latanoprost (XALATAN) 0.005 % ophthalmic  solution Place 1 drop into both eyes at bedtime.    Yes [provider]  memantine (NAMENDA) 10 MG tablet Take 20 mg by mouth at bedtime.   Yes [provider]  metoprolol succinate (TOPROL-XL) 25 MG 24 hr tablet Take 0.5 tablets (12.5 mg total) by mouth at bedtime. 05/31/20  Yes Lilland, Alana, DO  Naphazoline HCl (CLEAR EYES OP) Apply 2 drops to eye daily as needed (dry eyes).   Yes [provider]  Nutritional Supplements (Foyil) Take 1 Bottle by mouth as needed (appetite, nutrition).   Yes [provider]  tamsulosin (FLOMAX) 0.4 MG CAPS capsule Take 0.4 mg by mouth at bedtime.   Yes [provider]  levETIRAcetam (KEPPRA) 250 MG tablet Take 125-375 mg by mouth See admin instructions. Take one-half tablet by mouth three times a day for 2 weeks, then take one tablet three times a day for 4 weeks, then take one and one-half tablets three times a day for partial seizures.    [provider]    Physical Exam: Vitals:   04/01/21 1300 04/01/21 1315 04/01/21 1332 04/01/21 1548  BP: (!) 156/96 (!) 148/88  (!) 165/94  Pulse: 82 78  91  Resp: 18 19  (!) 24  Temp:   100.1 F (37.8 C) (!) 100.5 F (38.1 C)  TempSrc:   Rectal Oral  SpO2: 97% 95%  93%     General:  Appears calm and comfortable and is in NAD. Sleeping.  Eyes:  PERRL, EOMI, normal lids, iris ENT:  grossly normal hearing, lips & tongue, mmm; appropriate dentition Neck:  no LAD, masses or thyromegaly; no carotid bruits Cardiovascular:  RRR, no m/r/g. No LE edema.  Respiratory:   CTA bilaterally with no wheezes/rales/rhonchi.  Normal respiratory effort. Decreased breath sounds in RLL  Abdomen:  soft, NT, ND, NABS Back:   normal alignment, no CVAT Skin:  no rash or induration seen on limited exam Musculoskeletal:  BUE: 5/5, BLE: 4/5, good ROM, no bony abnormality Lower extremity:  No LE edema.  Limited foot exam with no ulcerations.  2+ distal  pulses. Psychiatric:  grossly normal mood and affect, speech fluent, but hardly talked.   Neurologic:  CN 2-12 grossly  intact. Intact finger to nose on right, he missed my finger on the left crossing midline. Heel to knee intact bilaterally. Sensation intact. DTR2+, gait deferred.    Radiological Exams on Admission: Independently reviewed - see discussion in A/P where applicable  DG Chest 2 View  Result Date: 04/01/2021 CLINICAL DATA:  Diminished lung sounds EXAM: CHEST - 2 VIEW COMPARISON:  Chest x-ray 03/30/2021 FINDINGS: The heart size is normal. Mediastinum appears stable. Left-sided cardiac pacemaker device. Pulmonary vasculature is normal. No focal consolidation identified. Elevated right hemidiaphragm with associated subsegmental atelectasis at the right lung base. No pleural effusion or pneumothorax visualized. IMPRESSION: No acute process identified. Elevated right hemidiaphragm with associated compressive atelectasis at the right lung base. Electronically Signed   By: Ofilia Neas M.D.   On: 04/01/2021 10:29   CT Head Wo Contrast  Result Date: 04/01/2021 CLINICAL DATA:  Mental status changes of unknown cause. Weakness and difficulty with word finding. EXAM: CT HEAD WITHOUT CONTRAST TECHNIQUE: Contiguous axial images were obtained from the base of the skull through the vertex without intravenous contrast. COMPARISON:  03/31/2021 FINDINGS: Brain: Small area of low-density in the left cerebellum with a central hyperdensity measuring about 5 mm in size is suspicious for an acute/subacute cerebellar stroke with some internal petechial bleeding. This was not present in January of last year. Cerebral hemispheres show generalized atrophy with extensive chronic small-vessel ischemic changes throughout the white matter, and thalami. No sign of acute supra tentorial stroke. No mass, other hemorrhage, hydrocephalus or extra-axial collection. Vascular: There is atherosclerotic calcification of the  major vessels at the base of the brain. Skull: Negative Sinuses/Orbits: Clear/normal Other: None IMPRESSION: Background pattern of generalized atrophy with extensive chronic small-vessel ischemic changes throughout the cerebral hemispheric white matter and thalami. I think there is an acute or subacute left cerebellar infarction with minimal petechial blood products. This does not appear visibly changed since yesterday. These results will be called to the ordering clinician or representative by the Radiologist Assistant, and communication documented in the PACS or [employer]. Electronically Signed   By: Nelson Chimes M.D.   On: 04/01/2021 11:47   CT Head Wo Contrast  Result Date: 03/31/2021 CLINICAL DATA:  Delirium EXAM: CT HEAD WITHOUT CONTRAST TECHNIQUE: Contiguous axial images were obtained from the base of the skull through the vertex without intravenous contrast. COMPARISON:  02/28/2020 FINDINGS: Brain: No evidence of acute infarction, hemorrhage, hydrocephalus, extra-axial collection or mass lesion/mass effect. Global cortical and central atrophy.  Stable ventriculomegaly. Subcortical white matter and periventricular small vessel ischemic changes. Vascular: Mild intracranial atherosclerosis. Skull: Normal. Negative for fracture or focal lesion. Sinuses/Orbits: The visualized paranasal sinuses are essentially clear. The mastoid air cells are unopacified. Other: None. IMPRESSION: No evidence of acute intracranial abnormality. Atrophy with stable ventriculomegaly. Small vessel ischemic changes. Electronically Signed   By: Julian Hy M.D.   On: 03/31/2021 03:12   MR BRAIN WO CONTRAST  Result Date: 04/01/2021 CLINICAL DATA:  Mental status change of unknown cause. EXAM: MRI HEAD WITHOUT CONTRAST TECHNIQUE: Multiplanar, multiecho pulse sequences of the brain and surrounding structures were obtained without intravenous contrast. COMPARISON:  Head CT earlier same day. FINDINGS: Brain: The  cerebellum shows numerous old micro hemorrhagic insults. There is a more recent hemorrhage within the left cerebellum measuring a few mm in size, corresponding with the CT finding. This could be a hemorrhagic infarction or a primary microhemorrhage. Cerebral hemispheres do not show any acute infarction. There are extensive chronic small-vessel ischemic changes throughout the  brain, many associated with micro hemorrhagic hemosiderin deposition. No large vessel territory infarction. No mass lesion, structure hydrocephalus or extra-axial collection. Vascular: Major vessels at the base of the brain show flow. Skull and upper cervical spine: Negative Sinuses/Orbits: Clear/normal Other: None IMPRESSION: Acute microhemorrhage within the left cerebellum, corresponding to the CT finding. This could either represent a hemorrhagic infarction or a primary microhemorrhage. Extensive chronic small-vessel ischemic changes affect the cerebral hemispheres, many of the insults associated with hemosiderin deposition. Chronic white matter volume loss with ventriculomegaly but no suspicion of obstructive hydrocephalus. Electronically Signed   By: Nelson Chimes M.D.   On: 04/01/2021 15:39   DG Chest Port 1 View  Result Date: 03/30/2021 CLINICAL DATA:  Questionable sepsis. EXAM: PORTABLE CHEST 1 VIEW COMPARISON:  June 21, 2020 FINDINGS: There is a dual lead AICD. Low lung volumes are noted with stable elevation of the right hemidiaphragm. There is no evidence of acute infiltrate, pleural effusion or pneumothorax. The heart size and mediastinal contours are within normal limits. The visualized skeletal structures are unremarkable. IMPRESSION: Stable exam without acute or active cardiopulmonary disease. Electronically Signed   By: Virgina Norfolk M.D.   On: 03/30/2021 23:19    EKG: Independently reviewed.  NSR with rate 73; nonspecific ST changes with no evidence of acute ischemia. 1st degree AV block    Labs on Admission: I  have personally reviewed the available labs and imaging studies at the time of the admission.  Pertinent labs:  platelets 111,  creatinine 1.32, (.96-1.04)    Assessment/Plan Principal Problem:   Nontraumatic cerebral hemorrhage [facility]) -Male with known history of intracerebral hemorrhage in 2006 and history of strokes x3 presenting with dizziness, difficulty walking and fever found to have acute microhemorrhage in the cerebellum. -Admit to progressive  -neurology consulted: recommended CTA head/neck and echo: ordered  -? CAA (was started on rivastigmine bid unsure if he tried this ? Referral l to vascular surgery) -Neurochecks per protocol -hold anticoagulation  -blood pressure goal <144 systolic: continue home meds with prn IV parameters.  -N.p.o. until bedside swallow screen-passed  -PT/ OT/ SLP consult   Active Problems:   Acute kidney injury ([place]) -no hx of renal injury -has had poor PO intake over last few days -gentle, IVF x 12 hours -follow bmp -hold nephrotoxic drugs  Fever  -x3 days -no obvious source, treated with rocephin x 1 in ED. No urinary complaints and no AMS (at baseline per wife). Hold on more antibiotics until culture results.  -lactic acid initial wnl.  -blood, urine culture pending. CXR with no acute findings  -add respiratory panel  -continue tylenol for fever control     Thrombocytopenia (HCC) Baseline appears to be around 132-145 Continue to monitor, may be lower with possible viral infection/UTI -follow     Essential hypertension [place] blood pressure <315 systolic.  Continue home norvasc and lopressor.  -IV parameters.    History of stroke x3 -currently on ASA and lipitor -repeat fasting lipid panel -continue statin, hold ASA with microbleed   ? Seizure events From neurology visit 9/22 Of note, he was admitted for further evaluation of these events in December 2021 at OSH. He underwent cEEG monitoring from 12/23-12/26 according to the  discharge summary with multiple events reportedly captured without electrographic change to indicate ictal activity; however, only the first day's report is available for review which did not have events noted. He also had pacemaker on 05/28/20 after found to have heart block but no improvement in events.  -  he had EMU done in 10/2020 with events recorded and non-epileptic per report -EEG per neurology  -seizure precautions -AED per neurology     Benign prostatic hyperplasia with urinary obstruction Continue home medication     Complete heart block s/p PPM  Stable, continue telemetry    There is no height or weight on file to calculate BMI.   Level of care: Telemetry Medical DVT prophylaxis:  SCDs Code Status:  Full - confirmed with family Family Communication: wife at bedside: Aleksander Edmiston  Disposition Plan:  The patient is from: home  Anticipated d/c is to: per day team  Requires inpatient hospitalization and is at significant risk of neurological worsening, requires constant monitoring, assessment and MDM with specialists.    Patient is currently: stable  Consults called: neurology  Admission status:  observation   Dragon dictation used in completing this note.    Orma Flaming MD [facility]   How to contact the [facility] Attending or Consulting provider [place] or covering provider during after hours [place], for this patient?  Check the care team in [facility] and look for a) attending/consulting [facility] provider listed and b) the [facility] team listed Log into [email or web address] and use [place]'s universal password to access. If you do not have the password, please contact the hospital operator. Locate the [facility] provider you are looking for under [facility] and page to a number that you can be directly reached. If you still have difficulty reaching the provider, please page the [facility] (Director on Call) for the Hospitalists listed on amion for assistance.   04/01/2021, 5:06 PM

## 2021-04-01 NOTE — ED Triage Notes (Signed)
EMS stated, He went home yesterday [place], has not eat or drink since he has been home.

## 2021-04-01 NOTE — ED Provider Notes (Signed)
[facility] EMERGENC[facility] DEPARTMENT Provider Note  CSN: [id number] Arrival date & time: 04/01/21 2505    History Chief Complaint  Patient presents with  . Weakness    Dustin Hamilton is a 80 y.o. male with prior history of stroke was seen in the ED about 36 hours ago for fever and difficulty walking. He had a relatively unremarkable workup, no source of fever founds, was at baseline mental status and able to walk without difficulty so he was discharged around 7am yesterday. Family reports since he has been home he has been more confused, still running occasional fevers and not able to get out of bed to walk to due weakness and/or balance issues. He is not able to further elaborate. Level 5 caveat applies.    Past Medical History:  Diagnosis Date  . BPH (benign prostatic hyperplasia)   . Cerebrovascular disease   . Congenital anomaly of diaphragm   . Elevated PSA   . Glaucoma, both eyes   . Hemorrhoid   . Hepatitis B surface antigen positive    02-20-2011  . History of adenomatous polyp of colon    2007, 2009 and 2013  tubular adenoma's  . History of alcohol abuse    quit 1963  . History of cerebral parenchymal hemorrhage    01/ 2006  left occiptial lobe related to hypertensive crisis  . History of CVA (cerebrovascular accident)    09-12-2012  left hippocampus/ amygdala junction and per MRI old white matter infarcts--  per pt residual short- term memory issues  . History of fatty infiltration of liver hx visit's at [facility] , last visit 05/ 2014   elvated LFT's ,  via liver bx 2004 related to hx alcohol and drug abuse (quit 1964)  . 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 HTN (hypertension)   . Renal cyst, left   . Stroke ([place])    hx of 3 strokes in past   . 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 Urethral lesion    urethral mass    Past Surgical History:  Procedure Laterality Date  .  CARDIOVASCULAR STRESS TEST  05/05/2007   normal nuclear study w/ no ischemia/  normal LV fucntion and wall motion , ef60%  . COLONOSCOPY  last one 04-06-2012  . CYSTO/  LEFT RETROGRADE PYELOGRAM/ CYTOLOGY WASHINGS/  URETEROSCOPY  03/05/2000  . INGUINAL HERNIA REPAIR Bilateral 1965 and 1980's  . LAPAROSCOPIC INGUINAL HERNIA WITH UMBILICAL HERNIA Right 39/76/7341  . LIVER BIOPSY  1980's and 2004  . 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   Procedure: PACEMAKER IMPLANT;  Surgeon: Evans Lance, MD;  Location: [facility];  Service: Cardiovascular;  Laterality: N/A;  . SVT ABLATION N/A 11/15/2018   Procedure: SVT ABLATION;  Surgeon: Evans Lance, MD;  Location: [facility];  Service: Cardiovascular;  Laterality: N/A;  . TRANSTHORACIC ECHOCARDIOGRAM  09/13/2012   moderate LVH,  ef 60-65%/    . TRANSURETHRAL RESECTION OF BLADDER TUMOR N/A 08/11/2016   Procedure: TRANSURETHRAL RESECTION OF BLADDER TUMOR (TURBT);  Surgeon: Cleon Gustin, MD;  Location: [facility];  Service: Urology;  Laterality: N/A;    Family History  Problem Relation Age of Onset  . Rheum arthritis Mother   . Diabetes Mother   . Stroke Mother   . Heart attack Mother   . Kidney failure Mother   . Heart attack  Father   . Heart disease Maternal Grandmother   . Rheum arthritis Maternal Grandmother   . Diabetes Maternal Grandmother   . Stroke Maternal Grandmother   . Colon cancer Neg Hx     Social History   Tobacco Use  . Smoking status: Former    Packs/day: 1.00    Years: 5.00    Pack years: 5.00    Types: Cigarettes    Quit date: 11/30/1981    Years since quitting: 39.3  . Smokeless tobacco: Never  Vaping Use  . Vaping Use: Never used  Substance Use Topics  . Alcohol use: No    Alcohol/week: 0.0 standard drinks    Comment: hx abuse -- quit:  1963  . Drug use: No    Comment: hx abuse -- quit 1964 (iv heroin and cocaine     Home Medications Prior to Admission medications    Medication Sig Start Date End Date Taking? Authorizing Provider  allopurinol (ZYLOPRIM) 100 MG tablet Take 100 mg by mouth daily.    [provider]  amLODipine (NORVASC) 2.5 MG tablet Take 1 tablet (2.5 mg total) by mouth in the morning and at bedtime. 09/28/20 03/31/21  Lorretta Harp, MD  aspirin EC 81 MG EC tablet Take 1 tablet (81 mg total) by mouth daily. Swallow whole. 06/01/20   Lilland, Alana, DO  atorvastatin (LIPITOR) 40 MG tablet Take 1 tablet by mouth at bedtime. 12/11/20   [provider]  brimonidine (ALPHAGAN) 0.2 % ophthalmic solution Place 1 drop into both eyes 2 (two) times daily.    [provider]  dutasteride (AVODART) 0.5 MG capsule Take 0.5 mg by mouth daily.    [provider]  latanoprost (XALATAN) 0.005 % ophthalmic solution Place 1 drop into both eyes at bedtime.     [provider]  memantine (NAMENDA) 5 MG tablet Take 17.5 mg by mouth at bedtime.     [provider]  metoprolol succinate (TOPROL-XL) 25 MG 24 hr tablet Take 0.5 tablets (12.5 mg total) by mouth at bedtime. 05/31/20   Lilland, Alana, DO  Nutritional Supplements ([facility] PO) Take 1 Bottle by mouth as needed.    [provider]  rivastigmine (EXELON) 1.5 MG capsule Take 1.5 mg by mouth 2 (two) times daily. 02/18/21   [provider]     Allergies    Aricept [donepezil], Penicillins, and Viagra [sildenafil]   Review of Systems   Review of Systems Unable to assess due to mental status.    Physical Exam BP (!) 153/116   Pulse 75   Temp 98.7 F (37.1 C) (Oral)   Resp 20   SpO2 96%   Physical Exam Vitals and nursing note reviewed.  Constitutional:      Appearance: Normal appearance.  HENT:     Head: Normocephalic and atraumatic.     Nose: Nose normal.     Mouth/Throat:     Mouth: Mucous membranes are moist.  Eyes:     Extraocular Movements: Extraocular movements intact.     Conjunctiva/sclera:  Conjunctivae normal.  Cardiovascular:     Rate and Rhythm: Normal rate.  Pulmonary:     Effort: Pulmonary effort is normal.     Breath sounds: Normal breath sounds.  Abdominal:     General: Abdomen is flat.     Palpations: Abdomen is soft.     Tenderness: There is no abdominal tenderness.  Musculoskeletal:        General: No swelling.  Normal range of motion.     Cervical back: Neck supple.  Skin:    General: Skin is warm and dry.  Neurological:     General: No focal deficit present.     Mental Status: He is alert. He is disoriented.     Cranial Nerves: No cranial nerve deficit.     Sensory: No sensory deficit.     Motor: No weakness.     Coordination: Coordination normal.  Psychiatric:        Mood and Affect: Mood normal.     ED Results / Procedures / Treatments   Labs (all labs ordered are listed, but only abnormal results are displayed) Labs Reviewed  CBC WITH DIFFERENTIAL/PLATELET - Abnormal; Notable for the following components:      Result Value   Platelets 111 (*)    All other components within normal limits  COMPREHENSIVE METABOLIC PANEL - Abnormal; Notable for the following components:   Glucose, Bld 115 (*)    Creatinine, Ser 1.32 (*)    Albumin 3.3 (*)    Total Bilirubin 1.7 (*)    GFR, Estimated 55 (*)    All other components within normal limits  CBG MONITORING, ED - Abnormal; Notable for the following components:   Glucose-Capillary 120 (*)    All other components within normal limits  URINE CULTURE  RESP PANEL BY RT-PCR (FLU A&B, COVID) ARPGX2  URINALYSIS, ROUTINE W REFLEX MICROSCOPIC  ETHANOL  RAPID URINE DRUG SCREEN, HOSP PERFORMED    EKG EKG Interpretation  Date/Time:  Monday April 01 2021 10:05:39 EDT Ventricular Rate:  73 PR Interval:  318 QRS Duration: 126 QT Interval:  392 QTC Calculation: 431 R Axis:   -63 Text Interpretation: Sinus rhythm with 1st degree A-V block Left axis deviation Non-specific intra-ventricular conduction block  Cannot rule out Anteroseptal infarct , age undetermined Abnormal ECG Confirmed by Lennice Sites [phone]) on 04/01/2021 11:43:29 AM  Radiology DG Chest 2 View  Result Date: 04/01/2021 CLINICAL DATA:  Diminished lung sounds EXAM: CHEST - 2 VIEW COMPARISON:  Chest x-ray 03/30/2021 FINDINGS: The heart size is normal. Mediastinum appears stable. Left-sided cardiac pacemaker device. Pulmonary vasculature is normal. No focal consolidation identified. Elevated right hemidiaphragm with associated subsegmental atelectasis at the right lung base. No pleural effusion or pneumothorax visualized. IMPRESSION: No acute process identified. Elevated right hemidiaphragm with associated compressive atelectasis at the right lung base. Electronically Signed   By: Ofilia Neas M.D.   On: 04/01/2021 10:29   CT Head Wo Contrast  Result Date: 04/01/2021 CLINICAL DATA:  Mental status changes of unknown cause. Weakness and difficulty with word finding. EXAM: CT HEAD WITHOUT CONTRAST TECHNIQUE: Contiguous axial images were obtained from the base of the skull through the vertex without intravenous contrast. COMPARISON:  03/31/2021 FINDINGS: Brain: Small area of low-density in the left cerebellum with a central hyperdensity measuring about 5 mm in size is suspicious for an acute/subacute cerebellar stroke with some internal petechial bleeding. This was not present in January of last year. Cerebral hemispheres show generalized atrophy with extensive chronic small-vessel ischemic changes throughout the white matter, and thalami. No sign of acute supra tentorial stroke. No mass, other hemorrhage, hydrocephalus or extra-axial collection. Vascular: There is atherosclerotic calcification of the major vessels at the base of the brain. Skull: Negative Sinuses/Orbits: Clear/normal Other: None IMPRESSION: Background pattern of generalized atrophy with extensive chronic small-vessel ischemic changes throughout the cerebral hemispheric white  matter and thalami. I think there is an acute or subacute left  cerebellar infarction with minimal petechial blood products. This does not appear visibly changed since yesterday. These results will be called to the ordering clinician or representative by the Radiologist Assistant, and communication documented in the PACS or [employer]. Electronically Signed   By: Nelson Chimes M.D.   On: 04/01/2021 11:47   CT Head Wo Contrast  Result Date: 03/31/2021 CLINICAL DATA:  Delirium EXAM: CT HEAD WITHOUT CONTRAST TECHNIQUE: Contiguous axial images were obtained from the base of the skull through the vertex without intravenous contrast. COMPARISON:  02/28/2020 FINDINGS: Brain: No evidence of acute infarction, hemorrhage, hydrocephalus, extra-axial collection or mass lesion/mass effect. Global cortical and central atrophy.  Stable ventriculomegaly. Subcortical white matter and periventricular small vessel ischemic changes. Vascular: Mild intracranial atherosclerosis. Skull: Normal. Negative for fracture or focal lesion. Sinuses/Orbits: The visualized paranasal sinuses are essentially clear. The mastoid air cells are unopacified. Other: None. IMPRESSION: No evidence of acute intracranial abnormality. Atrophy with stable ventriculomegaly. Small vessel ischemic changes. Electronically Signed   By: Julian Hy M.D.   On: 03/31/2021 03:12   DG Chest Port 1 View  Result Date: 03/30/2021 CLINICAL DATA:  Questionable sepsis. EXAM: PORTABLE CHEST 1 VIEW COMPARISON:  June 21, 2020 FINDINGS: There is a dual lead AICD. Low lung volumes are noted with stable elevation of the right hemidiaphragm. There is no evidence of acute infiltrate, pleural effusion or pneumothorax. The heart size and mediastinal contours are within normal limits. The visualized skeletal structures are unremarkable. IMPRESSION: Stable exam without acute or active cardiopulmonary disease. Electronically Signed   By: Virgina Norfolk M.D.   On:  03/30/2021 23:19    Procedures Procedures  Medications Ordered in the ED Medications - No data to display   MDM Rules/Calculators/A&P MDM  Patient with persistent fevers at home, not febrile here. Has had difficulty walking and confusion since being at home as well. Seen in triage and had workup initiated, CT is concerning for new cerebellar infarct from the day before. Will send for MRI if pacemaker is compatible ([employer]).   ED Course  I have reviewed the triage vital signs and the nursing notes.  Pertinent labs & imaging results that were available during my care of the patient were reviewed by me and considered in my medical decision making (see chart for details).  Clinical Course as of 04/01/21 1553  Mon Apr 01, 2021  1256 Spoke with Dr. Rory Percy, Neurology, who agrees with plan for MRI, he will evaluate depending on findings. MRI tech will arrange for the staff to be available for MRI with pacemaker.  [CS]  1329 Patient having tremors now, feels warmer. Asked RN to check a rectal temp, oral is 99.15F.  [CS]  1348 Rectal temp is 100.42F likely going up given his tremors. UA with WBC could be source. Will give Rocephin pending MRI results.  [CS]  6789 Covid/Flu is neg. EtOH is neg.  [CS]  3810 Care of the patient signed out to Dr. Reubin Milan at the change of shift.  [CS]    Clinical Course User Index [CS] Truddie Hidden, MD    Final Clinical Impression(s) / ED Diagnoses Final diagnoses:  None    Rx / DC Orders ED Discharge Orders     None        Truddie Hidden, MD 04/01/21 1553

## 2021-04-02 ENCOUNTER — Telehealth: Payer: Self-pay

## 2021-04-02 ENCOUNTER — Observation Stay (HOSPITAL_COMMUNITY): Payer: No Typology Code available for payment source

## 2021-04-02 DIAGNOSIS — F0150 Vascular dementia without behavioral disturbance: Secondary | ICD-10-CM | POA: Diagnosis present

## 2021-04-02 DIAGNOSIS — M25461 Effusion, right knee: Secondary | ICD-10-CM | POA: Diagnosis not present

## 2021-04-02 DIAGNOSIS — I10 Essential (primary) hypertension: Secondary | ICD-10-CM

## 2021-04-02 DIAGNOSIS — I442 Atrioventricular block, complete: Secondary | ICD-10-CM

## 2021-04-02 DIAGNOSIS — N401 Enlarged prostate with lower urinary tract symptoms: Secondary | ICD-10-CM | POA: Diagnosis present

## 2021-04-02 DIAGNOSIS — G309 Alzheimer's disease, unspecified: Secondary | ICD-10-CM | POA: Diagnosis present

## 2021-04-02 DIAGNOSIS — N179 Acute kidney failure, unspecified: Secondary | ICD-10-CM | POA: Diagnosis present

## 2021-04-02 DIAGNOSIS — R569 Unspecified convulsions: Secondary | ICD-10-CM | POA: Diagnosis not present

## 2021-04-02 DIAGNOSIS — D6959 Other secondary thrombocytopenia: Secondary | ICD-10-CM | POA: Diagnosis present

## 2021-04-02 DIAGNOSIS — M1711 Unilateral primary osteoarthritis, right knee: Secondary | ICD-10-CM | POA: Diagnosis not present

## 2021-04-02 DIAGNOSIS — Z20822 Contact with and (suspected) exposure to covid-19: Secondary | ICD-10-CM | POA: Diagnosis present

## 2021-04-02 DIAGNOSIS — Z950 Presence of cardiac pacemaker: Secondary | ICD-10-CM | POA: Diagnosis not present

## 2021-04-02 DIAGNOSIS — Z87891 Personal history of nicotine dependence: Secondary | ICD-10-CM | POA: Diagnosis not present

## 2021-04-02 DIAGNOSIS — M10061 Idiopathic gout, right knee: Secondary | ICD-10-CM | POA: Diagnosis present

## 2021-04-02 DIAGNOSIS — I6389 Other cerebral infarction: Secondary | ICD-10-CM | POA: Diagnosis not present

## 2021-04-02 DIAGNOSIS — K760 Fatty (change of) liver, not elsewhere classified: Secondary | ICD-10-CM | POA: Diagnosis present

## 2021-04-02 DIAGNOSIS — I680 Cerebral amyloid angiopathy: Secondary | ICD-10-CM

## 2021-04-02 DIAGNOSIS — I619 Nontraumatic intracerebral hemorrhage, unspecified: Secondary | ICD-10-CM | POA: Diagnosis not present

## 2021-04-02 DIAGNOSIS — F0280 Dementia in other diseases classified elsewhere without behavioral disturbance: Secondary | ICD-10-CM | POA: Diagnosis present

## 2021-04-02 DIAGNOSIS — N138 Other obstructive and reflux uropathy: Secondary | ICD-10-CM | POA: Diagnosis present

## 2021-04-02 DIAGNOSIS — Z8673 Personal history of transient ischemic attack (TIA), and cerebral infarction without residual deficits: Secondary | ICD-10-CM | POA: Diagnosis not present

## 2021-04-02 DIAGNOSIS — Z823 Family history of stroke: Secondary | ICD-10-CM | POA: Diagnosis not present

## 2021-04-02 DIAGNOSIS — R29701 NIHSS score 1: Secondary | ICD-10-CM | POA: Diagnosis present

## 2021-04-02 DIAGNOSIS — I614 Nontraumatic intracerebral hemorrhage in cerebellum: Secondary | ICD-10-CM | POA: Diagnosis present

## 2021-04-02 DIAGNOSIS — Z79899 Other long term (current) drug therapy: Secondary | ICD-10-CM | POA: Diagnosis not present

## 2021-04-02 DIAGNOSIS — E785 Hyperlipidemia, unspecified: Secondary | ICD-10-CM | POA: Diagnosis present

## 2021-04-02 DIAGNOSIS — Z7982 Long term (current) use of aspirin: Secondary | ICD-10-CM | POA: Diagnosis not present

## 2021-04-02 DIAGNOSIS — E854 Organ-limited amyloidosis: Secondary | ICD-10-CM | POA: Diagnosis present

## 2021-04-02 DIAGNOSIS — Z8249 Family history of ischemic heart disease and other diseases of the circulatory system: Secondary | ICD-10-CM | POA: Diagnosis not present

## 2021-04-02 DIAGNOSIS — Z833 Family history of diabetes mellitus: Secondary | ICD-10-CM | POA: Diagnosis not present

## 2021-04-02 DIAGNOSIS — F03918 Unspecified dementia, unspecified severity, with other behavioral disturbance: Secondary | ICD-10-CM

## 2021-04-02 LAB — RESPIRATORY PANEL BY PCR

## 2021-04-02 LAB — URINE CULTURE

## 2021-04-02 LAB — BASIC METABOLIC PANEL
Anion gap: 9 (ref 5–15)
BUN: 18 mg/dL (ref 8–23)
CO2: 20 mmol/L — ABNORMAL LOW (ref 22–32)
Calcium: 8.9 mg/dL (ref 8.9–10.3)
Chloride: 105 mmol/L (ref 98–111)
Creatinine, Ser: 1.09 mg/dL (ref 0.61–1.24)
GFR, Estimated: >60 mL/min (ref >60)
Glucose, Bld: 88 mg/dL (ref 70–99)
Potassium: 3.7 mmol/L (ref 3.5–5.1)
Sodium: 134 mmol/L — ABNORMAL LOW (ref 135–145)

## 2021-04-02 LAB — LIPID PANEL
Cholesterol: 78 mg/dL (ref 0–200)
HDL: 27 mg/dL — ABNORMAL LOW (ref >40)
LDL Cholesterol: 40 mg/dL (ref 0–99)
Total CHOL/HDL Ratio: 2.9 RATIO
Triglycerides: 53 mg/dL (ref <150)
VLDL: 11 mg/dL (ref 0–40)

## 2021-04-02 LAB — CBC
HCT: 43.4 % (ref 39.0–52.0)
Hemoglobin: 14.8 g/dL (ref 13.0–17.0)
MCH: 31.2 pg (ref 26.0–34.0)
MCHC: 34.1 g/dL (ref 30.0–36.0)
MCV: 91.6 fL (ref 80.0–100.0)
Platelets: 106 10*3/uL — ABNORMAL LOW (ref 150–400)
RBC: 4.74 MIL/uL (ref 4.22–5.81)
RDW: 13.6 % (ref 11.5–15.5)
WBC: 6.2 10*3/uL (ref 4.0–10.5)
nRBC: 0 % (ref 0.0–0.2)

## 2021-04-02 LAB — ECHOCARDIOGRAM COMPLETE
AV Mean grad: 3 mmHg
AV Peak grad: 5.9 mmHg
Ao pk vel: 1.21 m/s
S' Lateral: 2.5 cm

## 2021-04-02 LAB — HEMOGLOBIN A1C
Hgb A1c MFr Bld: 5.8 % — ABNORMAL HIGH (ref 4.8–5.6)
Mean Plasma Glucose: 119.76 mg/dL

## 2021-04-02 LAB — MAGNESIUM: Magnesium: 1.8 mg/dL (ref 1.7–2.4)

## 2021-04-02 MED ORDER — PERFLUTREN LIPID MICROSPHERE
1.0000 mL | INTRAVENOUS | Status: AC | PRN
  Administered 2021-04-02: 2 mL via INTRAVENOUS
  Filled 2021-04-02: qty 10

## 2021-04-02 MED ORDER — ATORVASTATIN CALCIUM 10 MG PO TABS
20.0000 mg | ORAL_TABLET | Freq: Every day | ORAL | Status: DC
  Administered 2021-04-03 – 2021-04-05 (×3): 20 mg via ORAL
  Filled 2021-04-02 (×3): qty 2

## 2021-04-02 NOTE — Procedures (Signed)
Patient Name: Dustin Hamilton  MRN: [id number]  Epilepsy Attending: Lora Havens  Referring Physician/Provider: Anibal Henderson, NP Date: 04/01/2021 Duration: 23.34 mins  Patient history: 80 y.o. male with 2 presentations in the past 2 days for evaluation of fevers and weakness impairing his ambulation; also with reports of "spells" where he stares off, is unable to speak, and wants to rest afterward.  EEG to evaluate for seizures.  Level of alertness: Awake, asleep  AEDs during EEG study: None  Technical aspects: This EEG study was done with scalp electrodes positioned according to the 10-20 International system of electrode placement. Electrical activity was acquired at a sampling rate of 500Hz  and reviewed with a high frequency filter of 70Hz  and a low frequency filter of 1Hz . EEG data were recorded continuously and digitally stored.   Description: The posterior dominant rhythm consists of 9-10 Hz activity of moderate voltage (25-35 uV) seen predominantly in posterior head regions, symmetric and reactive to eye opening and eye closing. Sleep was characterized by vertex waves, sleep spindles (12 to 14 Hz), maximal frontocentral region.  Hyperventilation and photic stimulation were not performed.     IMPRESSION: This study is within normal limits. No seizures or epileptiform discharges were seen throughout the recording.  Juliahna Wiswell Barbra Sarks

## 2021-04-02 NOTE — Progress Notes (Addendum)
STROKE TEAM PROGRESS NOTE   INTERVAL HISTORY Patient is seen in his room with his wife and niece at the bedside.  Both family members report that patient continues to have transient episodes of unresponsiveness and that these have recently increased in frequency.  Patient's wife is eager to have an explanation for patient's symptoms.  Patient has undergone a seizure workup, which was negative. He was diagnosed with cerebral amyloid angiopathy by his outpatient neurologist.   He was found to have a left cerebellar hemorrhage on MRI on admission yesterday.    Vitals:   04/02/21 1000 04/02/21 1035 04/02/21 1040 04/02/21 1205  BP: 110/71   137/80  Pulse: 80 79 77 74  Resp: (!) 24 (!) 22 18 16   Temp:    97.7 F (36.5 C)  TempSrc:    Oral  SpO2: 92% (!) 89% 92% 96%   CBC:  Recent Labs  Lab 03/30/21 2235 04/01/21 1015 04/02/21 0336  WBC 5.1 6.3 6.2  NEUTROABS 4.0 4.9  --   HGB 16.0 15.7 14.8  HCT 46.0 46.6 43.4  MCV 91.8 93.2 91.6  PLT 115* 111* 093*   Basic Metabolic Panel:  Recent Labs  Lab 04/01/21 1015 04/02/21 0336  NA 136 134*  K 4.3 3.7  CL 102 105  CO2 25 20*  GLUCOSE 115* 88  BUN 18 18  CREATININE 1.32* 1.09  CALCIUM 9.4 8.9  MG  --  1.8   Lipid Panel:  Recent Labs  Lab 04/02/21 0336  CHOL 78  TRIG 53  HDL 27*  CHOLHDL 2.9  VLDL 11  LDLCALC 40   HgbA1c:  Recent Labs  Lab 04/02/21 0336  HGBA1C 5.8*   Urine Drug Screen:  Recent Labs  Lab 04/01/21 1228  LABOPIA NONE DETECTED  COCAINSCRNUR NONE DETECTED  LABBENZ NONE DETECTED  AMPHETMU NONE DETECTED  THCU NONE DETECTED  LABBARB NONE DETECTED    Alcohol Level  Recent Labs  Lab 04/01/21 1245  ETH <10    IMAGING past 24 hours CT ANGIO HEAD NECK W WO CM  Result Date: 04/01/2021 CLINICAL DATA:  Confusion and loss of balance EXAM: CT ANGIOGRAPHY HEAD AND NECK TECHNIQUE: Multidetector CT imaging of the head and neck was performed using the standard protocol during bolus administration of  intravenous contrast. Multiplanar CT image reconstructions and MIPs were obtained to evaluate the vascular anatomy. Carotid stenosis measurements (when applicable) are obtained utilizing NASCET criteria, using the distal internal carotid diameter as the denominator. CONTRAST:  82mL OMNIPAQUE IOHEXOL 350 MG/ML SOLN COMPARISON:  04/01/2021 FINDINGS: CTA NECK FINDINGS SKELETON: There is no bony spinal canal stenosis. No lytic or blastic lesion. OTHER NECK: Normal pharynx, larynx and major salivary glands. No cervical lymphadenopathy. Unremarkable thyroid gland. UPPER CHEST: Incompletely visualized small right pleural effusion with area of suspected atelectasis in the right middle lobe. AORTIC ARCH: There is no calcific atherosclerosis of the aortic arch. There is no aneurysm, dissection or hemodynamically significant stenosis of the visualized portion of the aorta. Conventional 3 vessel aortic branching pattern. The visualized proximal subclavian arteries are widely patent. RIGHT CAROTID SYSTEM: Normal without aneurysm, dissection or stenosis. LEFT CAROTID SYSTEM: Normal without aneurysm, dissection or stenosis. VERTEBRAL ARTERIES: Left dominant configuration. Both origins are clearly patent. There is no dissection, occlusion or flow-limiting stenosis to the skull base (V1-V3 segments). CTA HEAD FINDINGS POSTERIOR CIRCULATION: --Vertebral arteries: Normal V4 segments. --Inferior cerebellar arteries: Normal. --Basilar artery: Normal. --Superior cerebellar arteries: Normal. --Posterior cerebral arteries (PCA): Normal. ANTERIOR CIRCULATION: --Intracranial internal  carotid arteries: Normal. --Anterior cerebral arteries (ACA): Normal. Both A1 segments are present. Patent anterior communicating artery (a-comm). --Middle cerebral arteries (MCA): Normal. VENOUS SINUSES: As permitted by contrast timing, patent. ANATOMIC VARIANTS: None Review of the MIP images confirms the above findings. IMPRESSION: 1. No emergent large vessel  occlusion or high-grade stenosis of the intracranial or cervical arteries. 2. Incompletely visualized small right pleural effusion with area of suspected atelectasis in the right middle lobe. 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 Electronically Signed   By: Ulyses Jarred M.D.   On: 04/01/2021 23:04   MR BRAIN WO CONTRAST  Result Date: 04/01/2021 CLINICAL DATA:  Mental status change of unknown cause. EXAM: MRI HEAD WITHOUT CONTRAST TECHNIQUE: Multiplanar, multiecho pulse sequences of the brain and surrounding structures were obtained without intravenous contrast. COMPARISON:  Head CT earlier same day. FINDINGS: Brain: The cerebellum shows numerous old micro hemorrhagic insults. There is a more recent hemorrhage within the left cerebellum measuring a few mm in size, corresponding with the CT finding. This could be a hemorrhagic infarction or a primary microhemorrhage. Cerebral hemispheres do not show any acute infarction. There are extensive chronic small-vessel ischemic changes throughout the brain, many associated with micro hemorrhagic hemosiderin deposition. No large vessel territory infarction. No mass lesion, structure hydrocephalus or extra-axial collection. Vascular: Major vessels at the base of the brain show flow. Skull and upper cervical spine: Negative Sinuses/Orbits: Clear/normal Other: None IMPRESSION: Acute microhemorrhage within the left cerebellum, corresponding to the CT finding. This could either represent a hemorrhagic infarction or a primary microhemorrhage. Extensive chronic small-vessel ischemic changes affect the cerebral hemispheres, many of the insults associated with hemosiderin deposition. Chronic white matter volume loss with ventriculomegaly but no suspicion of obstructive hydrocephalus. Electronically Signed   By: Nelson Chimes M.D.   On: 04/01/2021 15:39   EEG adult  Result Date: 04/02/2021 Lora Havens, MD     04/02/2021  8:56 AM Patient Name: Dustin Hamilton MRN: [id number]  Epilepsy Attending: Lora Havens Referring Physician/Provider: Anibal Henderson, NP Date: 04/01/2021 Duration: 23.34 mins Patient history: 80 y.o. male with 2 presentations in the past 2 days for evaluation of fevers and weakness impairing his ambulation; also with reports of "spells" where he stares off, is unable to speak, and wants to rest afterward.  EEG to evaluate for seizures. Level of alertness: Awake, asleep AEDs during EEG study: None Technical aspects: This EEG study was done with scalp electrodes positioned according to the 10-20 International system of electrode placement. Electrical activity was acquired at a sampling rate of 500Hz  and reviewed with a high frequency filter of 70Hz  and a low frequency filter of 1Hz . EEG data were recorded continuously and digitally stored. Description: The posterior dominant rhythm consists of 9-10 Hz activity of moderate voltage (25-35 uV) seen predominantly in posterior head regions, symmetric and reactive to eye opening and eye closing. Sleep was characterized by vertex waves, sleep spindles (12 to 14 Hz), maximal frontocentral region.  Hyperventilation and photic stimulation were not performed.   IMPRESSION: This study is within normal limits. No seizures or epileptiform discharges were seen throughout the recording. [place]    PHYSICAL EXAM General:  Patient is a drowsy, well-nourished male in no acute distress.     NEURO:  Mental Status: AA&Ox2, able to give year but requires prompting from family to give month.  Short term memory poor, and patient will at times ask for commands to be repeated.  During exam, patient had an episode of transient decreased  responsiveness where he stopped speaking or following commands, closed eyes and did not respond to name or touch for approximately one minute.  After that, patient responded to name and touch and began interacting appropriately again.  He did not appear to have awareness of this  episode  Speech/Language: speech slightly slow but without dysarthria or aphasia.  Naming, repetition, fluency, and comprehension intact.  Cranial Nerves:  II: PERRL. Visual fields full.  III, IV, VI: EOMI. Eyelids elevate symmetrically.  V: Sensation is intact to light touch and symmetrical to face.  VII: Smile is symmetrical. Able to puff cheeks and raise eyebrows.  VIII: hearing intact to voice. IX, X: Phonation is normal.  XII: tongue is midline without fasciculations. Motor: 5/5 strength to all muscle groups tested.  Tone: is normal and bulk is normal Sensation- Intact to light touch bilaterally. Extinction absent to light touch.  Coordination: FTN intact bilaterally, no drift Gait- deferred   ASSESSMENT/PLAN Dustin Hamilton is a 80 y.o. 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 Atrophy.  CTA head & neck No emergent LVO or high-grade stenosis MRI  Left cerebellar microhemorrhage, numerous cortical-based microhemorrhages, extensive small-vessel ischemic changes and chronic white matter volume loss ICH score 2 2D Echo EF 60-65%, no atrial level shunt detected LDL 40 HgbA1c 5.8 VTE prophylaxis - SCDs aspirin 81 mg daily prior to admission, now on No antithrombotic due to CAA and cerebellar microhemorrhage Therapy recommendations:  SNF Disposition:  pending  Hypertension Home meds:  Amlodipine 2.5 mg daily, metoprolol succinate 12.5 mg daily, continue in hospital Stable Strict BP control advised due to cerebellar microhemorrhage and CAA.   SBP goal  should be 120s-130s.  Hyperlipidemia Home meds:  atorvastatin 40 mg daily LDL 40, goal < 70 decreasing atorvastatin to 20, given low LDL level and CAA Continue statin at discharge  CAA MRI showed numerous cortical-based CMBs History of left occipital microhemorrhage in 2006 Current left cerebellar microhemorrhage Discontinue ASA to reduce risk of further bleeding Maintain strict blood pressure control with SBP in 120s-130s to decrease risk of further bleeding Transient spells of decreased responsiveness are likely TFNEs Pt will continue to follow up with [facility]  Spells, likely encephalopathy vs. TFNEs (amyloid spells). Less likely seizures Eyes closing with no verbal response for brief period - multiple spells during the day per family Presentation not consistent with seizure EEG negative for seizure activity Previous EEG studies in [facility] have shown no seizure activity during patient's episodes of decreased responsiveness No antiepileptic medications indicated at this time Pt will continue to follow up with [facility]  History of ischemic strokes  Patient has had three small ischemic strokes, in 2014, 2020 and 2022 Anticoagulation/DAPT held due to CAA and current left cerebellar microhemorrhages Continue atorvastatin at lower dose Pt will continue to follow up with [facility]  Other Stroke Risk Factors Advanced Age >/= 61   Other Active Problems Dementia (vascular versus Alzheimer's) - Continue home memantine - Delirium precautions History of PSVT, s/p ablation, subsequent complete heart block and pacemaker insertion - ? Aflutter per chart - pt not candidate for anticoagulation due to [facility] day # 0  Cortney E Carron Curie, NP   ATTENDING NOTE: I reviewed above note and agree with the assessment and plan. Pt was seen and examined.   For detailed assessment and plan, please refer to above as I have made changes wherever appropriate.   Neurology will sign off. Please call with  questions. Pt will follow up with [facility] neurology on 05/16/21 as scheduled. Thanks for the consult.   Rosalin Hawking, MD PhD Stroke Neurology 04/02/2021 7:27 PM  I had long discussion with wife and daughter at bedside, updated pt current condition, treatment plan and potential prognosis, and answered all the questions.  They expressed understanding and appreciation. I spent  35 minutes in total face-to-face time with the patient, more than 50% of which was spent in counseling and coordination of care, reviewing test results, images and medication, and discussing the diagnosis, treatment plan and potential prognosis. This patient's care requiresreview of multiple databases, neurological assessment, discussion with family, other specialists and medical decision making of high complexity.      To contact Stroke Continuity provider, please refer to [email or web address] After hours, contact General Neurology

## 2021-04-02 NOTE — TOC Initial Note (Signed)
Transition of Care [facility]) - Initial/Assessment Note    Patient Details  Name: Dustin Hamilton MRN: [id number] Date of Birth: 1940-06-21  Transition [facility]) CM/SW Contact:    Joanne Chars, LCSW Phone Number: 04/02/2021, 3:05 PM  Clinical Narrative:   CSW attempted to meet with pt for initial assessment, pt unable to participate, SNF choice document left in room. CSW spoke with wife Dustin Hamilton, discussed recommendation for SNF.  Pt lives at home with wife, [place] provides [facility] aide 4x week for 3 hours.  Wife would like to think about SNF vs HH choice and will talk to [employer].  Pt has been vaccinated for covid with one booster.                   Expected Discharge Plan:  (TBD) Barriers to Discharge: Continued Medical Work up   Patient Goals and CMS Choice   CMS Medicare.gov Compare Post Acute Care list provided to:: Patient Represenative (must comment) Choice offered to / list presented to : Spouse  Expected Discharge Plan and Services Expected Discharge Plan:  (TBD)     Post Acute Care Choice:  (TBD) Living arrangements for the past 2 months: Single Family Home                                      Prior Living Arrangements/Services Living arrangements for the past 2 months: Single Family Home Lives with:: Spouse Patient language and need for interpreter reviewed:: No        Need for Family Participation in Patient Care: Yes (Comment) Care giver support system in place?: Yes (comment) Current home services: Homehealth aide (through [place]) Criminal Activity/Legal Involvement Pertinent to Current Situation/Hospitalization: No - Comment as needed  Activities of Daily Living      Permission Sought/Granted                  Emotional Assessment Appearance:: Appears stated age Attitude/Demeanor/Rapport: Unable to Assess Affect (typically observed): Unable to Assess Orientation: : Oriented to Self, Oriented to Place Alcohol / Substance Use: Not  Applicable Psych Involvement: No (comment)  Admission diagnosis:  Nontraumatic cerebral hemorrhage 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   Fever and chills 04/01/2021   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   Complete heart block ([place])    Heart block    Seizure ([place]) 05/22/2020   Syncope 05/03/2020   History of stroke 05/03/2020   History of supraventricular tachycardia 05/03/2020   Thrombocytopenia ([place]) 05/03/2020   Acute kidney injury ([place]) 05/03/2020   Memory impairment 05/03/2020   Atypical chest pain 01/27/2020   SVT (supraventricular tachycardia) ([place]) 06/03/2017   Dizziness 08/02/2014   CVA (cerebral infarction) 09/13/2012   Vertigo 09/12/2012   EAR PAIN 09/05/2009   BACK PAIN 06/20/2009   COLONIC POLYPS 05/06/2007   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   NONDEPENDENT ALCOHOL ABUSE IN REMISSION 05/06/2007   Essential hypertension 05/06/2007   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 [place] [address] Phone: [phone] Fax: [phone]  [place], [place] - [address] [place] [address] Phone: [phone] Fax: [phone]     Social Determinants of Health (SDOH) Interventions    Readmission Risk Interventions No flowsheet data found.

## 2021-04-02 NOTE — Progress Notes (Addendum)
PROGRESS NOTE    Dustin Hamilton  ZPH:[id number] DOB: 01/03/1941 DOA: 04/01/2021 PCP: [facility]  Brief Narrative: 80/M with history of vascular dementia, hypertension, numerous strokes in 2014, 2020, 2022, prior alcoholism, history of cerebral microbleed's presented to the ED on 10/29 for evaluation of fever and difficulty walking he was discharged home on 10/30 after an unrevealing work-up, however had difficulty ambulating with weakness and dizziness and came back to the ED, MRI brain noted acute microhemorrhage in the left cerebellum. -He is also being followed by [facility] epilepsy clinic for spells of decreased responsiveness, blank stares at 1 point was started on Keppra for similar spells back in 2018, reportedly has been having more spells in the last few weeks, last follow-up with [facility] neurology Keppra was discontinued due to worsening lethargy  Assessment & Plan:   Left cerebellar microhemorrhage -Could be hemorrhagic transformation of an ischemic stroke, cerebral amyloid angiopathy also being considered -Neurology following, follow-up EEG -CTA head and neck -Follow-up HbA1c, lipid panel  ?  Partial seizures, seizure-like spells -No longer on Keppra, neurology following -Follow-up EEG, may need continuous monitoring  Fever  -T-max of 100.5 yesterday, afebrile since -Could be central in the setting of cerebellar hemorrhage -Chest x-ray, urinalysis unremarkable, abdominal exam is benign, no other localizing symptoms -Monitor clinically off antibiotics, also check procalcitonin, CRP  Acute kidney injury -Creatinine has normalized with hydration  History of multiple strokes Vascular dementia -Continue statin, aspirin on hold  History of BPH -Continue Flomax, Avodart  History of complete heart block s/p PPM  DVT prophylaxis: SCDs Code Status: Full code, confirmed with wife Family Communication: Discussed with wife [name] in the emergency  room Disposition Plan:  Status is: Observation  The patient will require care spanning > 2 midnights and should be moved to inpatient because: Severity of illness, low-grade fever, hemorrhagic stroke and seizure-like episodes    Consultants:  Neurology  Procedures:   Antimicrobials:    Subjective: -Reports feeling tired, sleeping more than usual per spouse, no other symptoms reported  Objective: Vitals:   04/02/21 0800 04/02/21 1000 04/02/21 1035 04/02/21 1040  BP: 111/75 110/71    Pulse: 80 80 79 77  Resp: (!) 25 (!) 24 (!) 22 18  Temp:      TempSrc:      SpO2: 95% 92% (!) 89% 92%    Intake/Output Summary (Last 24 hours) at 04/02/2021 1125 Last data filed at 04/01/2021 2319 Gross per 24 hour  Intake 1097.2 ml  Output 800 ml  Net 297.2 ml   There were no vitals filed for this visit.  Examination:  General exam: Elderly male, chronically ill-appearing,  laying in bed, somnolent but arousable, oriented to self and place, cognitive deficits noted HEENT: No JVD CVS: S1-S2 regular rhythm Lungs: Decreased breath sounds at the bases otherwise clear Abdomen: Soft, nontender, bowel sounds present Extremities: No edema Skin: No rash on exposed skin Neuro: Somnolent but easily arousable, moves all extremities, no localizing signs   Data Reviewed:   CBC: Recent Labs  Lab 03/30/21 2235 04/01/21 1015 04/02/21 0336  WBC 5.1 6.3 6.2  NEUTROABS 4.0 4.9  --   HGB 16.0 15.7 14.8  HCT 46.0 46.6 43.4  MCV 91.8 93.2 91.6  PLT 115* 111* 801*   Basic Metabolic Panel: Recent Labs  Lab 03/30/21 2235 04/01/21 1015 04/02/21 0336  NA 134* 136 134*  K 3.9 4.3 3.7  CL 104 102 105  CO2 21* 25 20*  GLUCOSE 152*  115* 88  BUN 15 18 18   CREATININE 0.96 1.32* 1.09  CALCIUM 9.5 9.4 8.9  MG  --   --  1.8   GFR: CrCl cannot be calculated (Unknown ideal weight.). Liver Function Tests: Recent Labs  Lab 03/30/21 2235 04/01/21 1015  AST 22 27  ALT 21 23  ALKPHOS 69 67   BILITOT 0.6 1.7*  PROT 6.7 6.9  ALBUMIN 3.3* 3.3*   No results for input(s): LIPASE, AMYLASE in the last 168 hours. No results for input(s): AMMONIA in the last 168 hours. Coagulation Profile: Recent Labs  Lab 03/30/21 2235  INR 1.0   Cardiac Enzymes: No results for input(s): CKTOTAL, CKMB, CKMBINDEX, TROPONINI in the last 168 hours. BNP (last 3 results) No results for input(s): PROBNP in the last 8760 hours. HbA1C: Recent Labs    04/02/21 0336  HGBA1C 5.8*   CBG: Recent Labs  Lab 03/30/21 2217 04/01/21 1014  GLUCAP 169* 120*   Lipid Profile: Recent Labs    04/02/21 0336  CHOL 78  HDL 27*  LDLCALC 40  TRIG 53  CHOLHDL 2.9   Thyroid Function Tests: No results for input(s): TSH, T4TOTAL, FREET4, T3FREE, THYROIDAB in the last 72 hours. Anemia Panel: No results for input(s): VITAMINB12, FOLATE, FERRITIN, TIBC, IRON, RETICCTPCT in the last 72 hours. Urine analysis:    Component Value Date/Time   COLORURINE AMBER (A) 04/01/2021 1200   APPEARANCEUR HAZY (A) 04/01/2021 1200   LABSPEC 1.023 04/01/2021 1200   PHURINE 5.0 04/01/2021 1200   GLUCOSEU NEGATIVE 04/01/2021 1200   GLUCOSEU NEGATIVE 02/17/2011 0930   HGBUR NEGATIVE 04/01/2021 1200   BILIRUBINUR NEGATIVE 04/01/2021 1200   KETONESUR NEGATIVE 04/01/2021 1200   PROTEINUR 100 (A) 04/01/2021 1200   UROBILINOGEN 1.0 09/12/2012 1802   NITRITE NEGATIVE 04/01/2021 1200   LEUKOCYTESUR MODERATE (A) 04/01/2021 1200   Sepsis Labs: @LABRCNTIP (procalcitonin:4,lacticidven:4)  ) Recent Results (from the past 240 hour(s))  Urine Culture     Status: Abnormal   Collection Time: 03/30/21 10:35 PM   Specimen: In/Out Cath Urine  Result Value Ref Range Status   Specimen Description IN/OUT CATH URINE  Final   Special Requests   Final    NONE Performed at [facility], [place] [address] [place], [place] [address]    Culture MULTIPLE SPECIES PRESENT, SUGGEST RECOLLECTION (A)  Final   Report Status 04/01/2021 FINAL   Final  Blood culture (routine x 2)     Status: None (Preliminary result)   Collection Time: 03/30/21 10:50 PM   Specimen: BLOOD RIGHT HAND  Result Value Ref Range Status   Specimen Description BLOOD RIGHT HAND  Final   Special Requests   Final    BOTTLES DRAWN AEROBIC AND ANAEROBIC Blood Culture results may not be optimal due to an inadequate volume of blood received in culture bottles   Culture   Final    NO GROWTH 2 DAYS Performed at [facility], [place] [address] [place], [place] [address]    Report Status PENDING  Incomplete  Resp Panel by RT-PCR (Flu A&B, Covid)     Status: None   Collection Time: 03/31/21 12:16 AM   Specimen: Nasopharyngeal(NP) swabs in vial transport medium  Result Value Ref Range Status   SARS Coronavirus 2 by RT PCR NEGATIVE NEGATIVE Final    Comment: (NOTE) SARS-CoV-2 target nucleic acids are NOT DETECTED.  The SARS-CoV-2 RNA is generally detectable in upper respiratory specimens during the acute phase of infection. The lowest concentration of  SARS-CoV-2 viral copies this assay can detect is 138 copies/mL. A negative result does not preclude SARS-Cov-2 infection and should not be used as the sole basis for treatment or other patient management decisions. A negative result may occur with  improper specimen collection/handling, submission of specimen other than nasopharyngeal swab, presence of viral mutation(s) within the areas targeted by this assay, and inadequate number of viral copies(<138 copies/mL). A negative result must be combined with clinical observations, patient history, and epidemiological information. The expected result is Negative.  Fact Sheet for Patients:  EntrepreneurPulse.com.au  Fact Sheet for Healthcare Providers:  IncredibleEmployment.be  This test is no t yet approved or cleared by the [place] FDA and  has been authorized for detection and/or diagnosis of SARS-CoV-2 by FDA under  an Emergency Use Authorization (EUA). This EUA will remain  in effect (meaning this test can be used) for the duration of the COVID-19 declaration under Section 564(b)(1) of the Act, 21 U.S.C.section 360bbb-3(b)(1), unless the authorization is terminated  or revoked sooner.       Influenza A by PCR NEGATIVE NEGATIVE Final   Influenza B by PCR NEGATIVE NEGATIVE Final    Comment: (NOTE) The Xpert Xpress SARS-CoV-2/FLU/RSV plus assay is intended as an aid in the diagnosis of influenza from Nasopharyngeal swab specimens and should not be used as a sole basis for treatment. Nasal washings and aspirates are unacceptable for Xpert Xpress SARS-CoV-2/FLU/RSV testing.  Fact Sheet for Patients: EntrepreneurPulse.com.au  Fact Sheet for Healthcare Providers: IncredibleEmployment.be  This test is not yet approved or cleared by the [place] FDA and has been authorized for detection and/or diagnosis of SARS-CoV-2 by FDA under an Emergency Use Authorization (EUA). This EUA will remain in effect (meaning this test can be used) for the duration of the COVID-19 declaration under Section 564(b)(1) of the Act, 21 U.S.C. section 360bbb-3(b)(1), unless the authorization is terminated or revoked.  Performed at [facility], [place] [address], [place], [place] [address]   Blood culture (routine x 2)     Status: None (Preliminary result)   Collection Time: 03/31/21 12:23 AM   Specimen: BLOOD  Result Value Ref Range Status   Specimen Description BLOOD SITE NOT SPECIFIED  Final   Special Requests   Final    BOTTLES DRAWN AEROBIC AND ANAEROBIC Blood Culture results may not be optimal due to an inadequate volume of blood received in culture bottles   Culture   Final    NO GROWTH 2 DAYS Performed at [facility], [place] [address] [place],  [address]    Report Status PENDING  Incomplete  Resp Panel by RT-PCR (Flu A&B, Covid) Nasopharyngeal Swab      Status: None   Collection Time: 04/01/21 12:42 PM   Specimen: Nasopharyngeal Swab; Nasopharyngeal(NP) swabs in vial transport medium  Result Value Ref Range Status   SARS Coronavirus 2 by RT PCR NEGATIVE NEGATIVE Final    Comment: (NOTE) SARS-CoV-2 target nucleic acids are NOT DETECTED.  The SARS-CoV-2 RNA is generally detectable in upper respiratory specimens during the acute phase of infection. The lowest concentration of SARS-CoV-2 viral copies this assay can detect is 138 copies/mL. A negative result does not preclude SARS-Cov-2 infection and should not be used as the sole basis for treatment or other patient management decisions. A negative result may occur with  improper specimen collection/handling, submission of specimen other than nasopharyngeal swab, presence of viral mutation(s) within the areas targeted by this assay, and inadequate number of  viral copies(<138 copies/mL). A negative result must be combined with clinical observations, patient history, and epidemiological information. The expected result is Negative.  Fact Sheet for Patients:  EntrepreneurPulse.com.au  Fact Sheet for Healthcare Providers:  IncredibleEmployment.be  This test is no t yet approved or cleared by the [place] FDA and  has been authorized for detection and/or diagnosis of SARS-CoV-2 by FDA under an Emergency Use Authorization (EUA). This EUA will remain  in effect (meaning this test can be used) for the duration of the COVID-19 declaration under Section 564(b)(1) of the Act, 21 U.S.C.section 360bbb-3(b)(1), unless the authorization is terminated  or revoked sooner.       Influenza A by PCR NEGATIVE NEGATIVE Final   Influenza B by PCR NEGATIVE NEGATIVE Final    Comment: (NOTE) The Xpert Xpress SARS-CoV-2/FLU/RSV plus assay is intended as an aid in the diagnosis of influenza from Nasopharyngeal swab specimens and should not be used as a sole basis  for treatment. Nasal washings and aspirates are unacceptable for Xpert Xpress SARS-CoV-2/FLU/RSV testing.  Fact Sheet for Patients: EntrepreneurPulse.com.au  Fact Sheet for Healthcare Providers: IncredibleEmployment.be  This test is not yet approved or cleared by the [place] FDA and has been authorized for detection and/or diagnosis of SARS-CoV-2 by FDA under an Emergency Use Authorization (EUA). This EUA will remain in effect (meaning this test can be used) for the duration of the COVID-19 declaration under Section 564(b)(1) of the Act, 21 U.S.C. section 360bbb-3(b)(1), unless the authorization is terminated or revoked.  Performed at [facility], [place] [address] [place], [place] [address]   Urine Culture     Status: Abnormal   Collection Time: 04/01/21  1:12 PM   Specimen: Urine, Clean Catch  Result Value Ref Range Status   Specimen Description URINE, CLEAN CATCH  Final   Special Requests   Final    NONE Performed at [facility], [place] [address] [place], [place] [address]    Culture MULTIPLE SPECIES PRESENT, SUGGEST RECOLLECTION (A)  Final   Report Status 04/02/2021 FINAL  Final  Culture, blood (routine x 2)     Status: None (Preliminary result)   Collection Time: 04/01/21  3:54 PM   Specimen: BLOOD  Result Value Ref Range Status   Specimen Description BLOOD BLOOD RIGHT FOREARM  Final   Special Requests   Final    BOTTLES DRAWN AEROBIC AND ANAEROBIC Blood Culture adequate volume   Culture   Final    NO GROWTH < 24 HOURS Performed at [facility], [place] [address] [place], [place] [address]    Report Status PENDING  Incomplete  Culture, blood (routine x 2)     Status: None (Preliminary result)   Collection Time: 04/01/21  3:59 PM   Specimen: BLOOD  Result Value Ref Range Status   Specimen Description BLOOD LEFT WRIST  Final   Special Requests   Final    BOTTLES DRAWN AEROBIC AND ANAEROBIC Blood Culture  results may not be optimal due to an inadequate volume of blood received in culture bottles   Culture   Final    NO GROWTH < 24 HOURS Performed at [facility], [place] [address] [place], [name] [address]    Report Status PENDING  Incomplete  Respiratory (~20 pathogens) panel by PCR     Status: None   Collection Time: 04/01/21 10:29 PM   Specimen: Nasopharyngeal Swab; Respiratory  Result Value Ref Range Status   Adenovirus NOT DETECTED  NOT DETECTED Final   Coronavirus 229E NOT DETECTED NOT DETECTED Final    Comment: (NOTE) The Coronavirus on the Respiratory Panel, DOES NOT test for the novel  Coronavirus (2019 nCoV)    Coronavirus HKU1 NOT DETECTED NOT DETECTED Final   Coronavirus NL63 NOT DETECTED NOT DETECTED Final   Coronavirus OC43 NOT DETECTED NOT DETECTED Final   Metapneumovirus NOT DETECTED NOT DETECTED Final   Rhinovirus / Enterovirus NOT DETECTED NOT DETECTED Final   Influenza A NOT DETECTED NOT DETECTED Final   Influenza B NOT DETECTED NOT DETECTED Final   Parainfluenza Virus 1 NOT DETECTED NOT DETECTED Final   Parainfluenza Virus 2 NOT DETECTED NOT DETECTED Final   Parainfluenza Virus 3 NOT DETECTED NOT DETECTED Final   Parainfluenza Virus 4 NOT DETECTED NOT DETECTED Final   Respiratory Syncytial Virus NOT DETECTED NOT DETECTED Final   Bordetella pertussis NOT DETECTED NOT DETECTED Final   Bordetella Parapertussis NOT DETECTED NOT DETECTED Final   Chlamydophila pneumoniae NOT DETECTED NOT DETECTED Final   Mycoplasma pneumoniae NOT DETECTED NOT DETECTED Final    Comment: Performed at [facility], [place] [address] [place], [place] [address]         Radiology Studies: CT ANGIO HEAD NECK W WO CM  Result Date: 04/01/2021 CLINICAL DATA:  Confusion and loss of balance EXAM: CT ANGIOGRAPHY HEAD AND NECK TECHNIQUE: Multidetector CT imaging of the head and neck was performed using the standard protocol during bolus administration of intravenous contrast.  Multiplanar CT image reconstructions and MIPs were obtained to evaluate the vascular anatomy. Carotid stenosis measurements (when applicable) are obtained utilizing NASCET criteria, using the distal internal carotid diameter as the denominator. CONTRAST:  51mL OMNIPAQUE IOHEXOL 350 MG/ML SOLN COMPARISON:  04/01/2021 FINDINGS: CTA NECK FINDINGS SKELETON: There is no bony spinal canal stenosis. No lytic or blastic lesion. OTHER NECK: Normal pharynx, larynx and major salivary glands. No cervical lymphadenopathy. Unremarkable thyroid gland. UPPER CHEST: Incompletely visualized small right pleural effusion with area of suspected atelectasis in the right middle lobe. AORTIC ARCH: There is no calcific atherosclerosis of the aortic arch. There is no aneurysm, dissection or hemodynamically significant stenosis of the visualized portion of the aorta. Conventional 3 vessel aortic branching pattern. The visualized proximal subclavian arteries are widely patent. RIGHT CAROTID SYSTEM: Normal without aneurysm, dissection or stenosis. LEFT CAROTID SYSTEM: Normal without aneurysm, dissection or stenosis. VERTEBRAL ARTERIES: Left dominant configuration. Both origins are clearly patent. There is no dissection, occlusion or flow-limiting stenosis to the skull base (V1-V3 segments). CTA HEAD FINDINGS POSTERIOR CIRCULATION: --Vertebral arteries: Normal V4 segments. --Inferior cerebellar arteries: Normal. --Basilar artery: Normal. --Superior cerebellar arteries: Normal. --Posterior cerebral arteries (PCA): Normal. ANTERIOR CIRCULATION: --Intracranial internal carotid arteries: Normal. --Anterior cerebral arteries (ACA): Normal. Both A1 segments are present. Patent anterior communicating artery (a-comm). --Middle cerebral arteries (MCA): Normal. VENOUS SINUSES: As permitted by contrast timing, patent. ANATOMIC VARIANTS: None Review of the MIP images confirms the above findings. IMPRESSION: 1. No emergent large vessel occlusion or  high-grade stenosis of the intracranial or cervical arteries. 2. Incompletely visualized small right pleural effusion with area of suspected atelectasis in the right middle lobe. 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 Electronically Signed   By: Ulyses Jarred M.D.   On: 04/01/2021 23:04   DG Chest 2 View  Result Date: 04/01/2021 CLINICAL DATA:  Diminished lung sounds EXAM: CHEST - 2 VIEW COMPARISON:  Chest x-ray 03/30/2021 FINDINGS: The heart size is normal. Mediastinum appears stable. Left-sided cardiac pacemaker device. Pulmonary vasculature is normal.  No focal consolidation identified. Elevated right hemidiaphragm with associated subsegmental atelectasis at the right lung base. No pleural effusion or pneumothorax visualized. IMPRESSION: No acute process identified. Elevated right hemidiaphragm with associated compressive atelectasis at the right lung base. Electronically Signed   By: Ofilia Neas M.D.   On: 04/01/2021 10:29   CT Head Wo Contrast  Result Date: 04/01/2021 CLINICAL DATA:  Mental status changes of unknown cause. Weakness and difficulty with word finding. EXAM: CT HEAD WITHOUT CONTRAST TECHNIQUE: Contiguous axial images were obtained from the base of the skull through the vertex without intravenous contrast. COMPARISON:  03/31/2021 FINDINGS: Brain: Small area of low-density in the left cerebellum with a central hyperdensity measuring about 5 mm in size is suspicious for an acute/subacute cerebellar stroke with some internal petechial bleeding. This was not present in January of last year. Cerebral hemispheres show generalized atrophy with extensive chronic small-vessel ischemic changes throughout the white matter, and thalami. No sign of acute supra tentorial stroke. No mass, other hemorrhage, hydrocephalus or extra-axial collection. Vascular: There is atherosclerotic calcification of the major vessels at the base of the brain. Skull: Negative Sinuses/Orbits: Clear/normal Other: None  IMPRESSION: Background pattern of generalized atrophy with extensive chronic small-vessel ischemic changes throughout the cerebral hemispheric white matter and thalami. I think there is an acute or subacute left cerebellar infarction with minimal petechial blood products. This does not appear visibly changed since yesterday. These results will be called to the ordering clinician or representative by the Radiologist Assistant, and communication documented in the PACS or [employer]. Electronically Signed   By: Nelson Chimes M.D.   On: 04/01/2021 11:47   MR BRAIN WO CONTRAST  Result Date: 04/01/2021 CLINICAL DATA:  Mental status change of unknown cause. EXAM: MRI HEAD WITHOUT CONTRAST TECHNIQUE: Multiplanar, multiecho pulse sequences of the brain and surrounding structures were obtained without intravenous contrast. COMPARISON:  Head CT earlier same day. FINDINGS: Brain: The cerebellum shows numerous old micro hemorrhagic insults. There is a more recent hemorrhage within the left cerebellum measuring a few mm in size, corresponding with the CT finding. This could be a hemorrhagic infarction or a primary microhemorrhage. Cerebral hemispheres do not show any acute infarction. There are extensive chronic small-vessel ischemic changes throughout the brain, many associated with micro hemorrhagic hemosiderin deposition. No large vessel territory infarction. No mass lesion, structure hydrocephalus or extra-axial collection. Vascular: Major vessels at the base of the brain show flow. Skull and upper cervical spine: Negative Sinuses/Orbits: Clear/normal Other: None IMPRESSION: Acute microhemorrhage within the left cerebellum, corresponding to the CT finding. This could either represent a hemorrhagic infarction or a primary microhemorrhage. Extensive chronic small-vessel ischemic changes affect the cerebral hemispheres, many of the insults associated with hemosiderin deposition. Chronic white matter volume loss with  ventriculomegaly but no suspicion of obstructive hydrocephalus. Electronically Signed   By: Nelson Chimes M.D.   On: 04/01/2021 15:39   EEG adult  Result Date: 04/02/2021 Lora Havens, MD     04/02/2021  8:56 AM Patient Name: ARTIS BEGGS MRN: [id number] Epilepsy Attending: Lora Havens Referring Physician/Provider: Anibal Henderson, NP Date: 04/01/2021 Duration: 23.34 mins Patient history: 80 y.o. male with 2 presentations in the past 2 days for evaluation of fevers and weakness impairing his ambulation; also with reports of "spells" where he stares off, is unable to speak, and wants to rest afterward.  EEG to evaluate for seizures. Level of alertness: Awake, asleep AEDs during EEG study: None Technical aspects: This EEG study was  done with scalp electrodes positioned according to the 10-20 International system of electrode placement. Electrical activity was acquired at a sampling rate of 500Hz  and reviewed with a high frequency filter of 70Hz  and a low frequency filter of 1Hz . EEG data were recorded continuously and digitally stored. Description: The posterior dominant rhythm consists of 9-10 Hz activity of moderate voltage (25-35 uV) seen predominantly in posterior head regions, symmetric and reactive to eye opening and eye closing. Sleep was characterized by vertex waves, sleep spindles (12 to 14 Hz), maximal frontocentral region.  Hyperventilation and photic stimulation were not performed.   IMPRESSION: This study is within normal limits. No seizures or epileptiform discharges were seen throughout the recording. Priyanka Barbra Sarks        Scheduled Meds:  amLODipine  2.5 mg Oral BID   atorvastatin  40 mg Oral Daily   brimonidine  1 drop Both Eyes BID   dutasteride  0.5 mg Oral Daily   latanoprost  1 drop Both Eyes QHS   memantine  20 mg Oral QHS   metoprolol succinate  12.5 mg Oral QHS   tamsulosin  0.4 mg Oral QHS   Continuous Infusions:   LOS: 0 days    Time spent:  61min  Domenic Polite, MD [facility]   04/02/2021, 11:25 AM

## 2021-04-02 NOTE — Progress Notes (Signed)
PT Evaluation    04/02/21 1247  PT Visit Information  Last PT Received On 04/02/21  Assistance Needed +2  PT/OT/SLP Co-Evaluation/Treatment Yes  Reason for Co-Treatment Necessary to address cognition/behavior during functional activity;Complexity of the patient's impairments (multi-system involvement);For patient/therapist safety  History of Present Illness 80 y.o. male presenting by EMS s/p seizure-like epsiode. imaging showed L cerebellar hemmorhage.  PMH is significant for CVA, HTN, pSVT with AVNRT ablation in 11/2018, Mobitz Type 1 AV block, dizziness.  Precautions  Precautions Fall  Restrictions  Weight Bearing Restrictions No  Home Living  Family/patient expects to be discharged to: Private residence  Living Arrangements Spouse/significant other  Available Help at Discharge Family;Available 24 hours/day  Type of [place] Two level;Full bath on main level;Bed/bath upstairs;Able to live on main level with bedroom/bathroom  Bathroom Shower/Tub Walk-in shower;Door  Corporate treasurer No  Home IT sales professional (2 wheels);Rollator (4 wheels);Cane - single point;Wheelchair - manual  Prior Function  Prior Level of Function  Independent/Modified Independent  Mobility Comments Using RW for ambulation  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  Communication  Communication No difficulties;Expressive difficulties  Pain Assessment  Pain Assessment No/denies pain  Cognition  Arousal/Alertness Lethargic  Behavior During Therapy Flat affect  Overall Cognitive Status Impaired/Different from baseline  Area of Impairment Attention;Orientation;Memory;Following commands;Safety/judgement;Awareness;Problem solving  Orientation Level Disoriented to;Time (unsure of month)  Current Attention Level Sustained  Memory Decreased short-term memory  Following Commands Follows one step commands with increased  time  Safety/Judgement Decreased awareness of safety;Decreased awareness of deficits  Awareness Emergent  Problem Solving Slow processing  Upper Extremity Assessment  Upper Extremity Assessment Defer to OT evaluation  Lower Extremity Assessment  Lower Extremity Assessment Generalized weakness  Cervical / Trunk Assessment  Cervical / Trunk Assessment Other exceptions (posterior bias)  Bed Mobility  Overal bed mobility Needs Assistance  Bed Mobility Supine to Sit;Sit to Supine  Supine to sit Mod assist;+2 for safety/equipment  Sit to supine Mod assist;+2 for safety/equipment  General bed mobility comments Required assist for trunk and LE assist  Transfers  Overall transfer level Needs assistance  Equipment used Rolling walker (2 wheels)  Transfers Sit to/from Stand  Sit to Stand Min assist;+2 physical assistance  General transfer comment Required assist for lift assist and steadying  Ambulation/Gait  Ambulation/Gait assistance Min assist;+2 physical assistance  Gait Distance (Feet) 2 Feet  Assistive device Rolling walker (2 wheels)  Gait Pattern/deviations Step-through pattern;Shuffle;Decreased stride length  General Gait Details Cues for step height as pt tended to drag BLE. Min A +2 for steadying and safety.  Gait velocity Decreased  Balance  Overall balance assessment Needs assistance  Sitting-balance support Feet supported;No upper extremity supported  Sitting balance-Leahy Scale Fair  Standing balance support Bilateral upper extremity supported  Standing balance-Leahy Scale Poor  Standing balance comment Reliant on UE support  General Comments  General comments (skin integrity, edema, etc.) VSS; moments of staring off and slow response-wife reports this happens occasionally  PT - End of Session  Equipment Utilized During Treatment Gait belt  Activity Tolerance Patient tolerated treatment well  Patient left in bed;with call bell/phone within reach;with family/visitor  present (on stretcher in ED)  Nurse Communication Mobility status  PT Assessment  PT Recommendation/Assessment Patient needs continued PT services  PT Visit Diagnosis Unsteadiness on feet (R26.81);Muscle weakness (generalized) (M62.81);Difficulty in walking, not elsewhere classified (R26.2)  PT Problem List Decreased strength;Decreased activity  tolerance;Decreased balance;Decreased mobility;Decreased knowledge of use of DME;Decreased knowledge of precautions  PT Plan  PT Frequency (ACUTE ONLY) Min 2X/week  PT Treatment/Interventions (ACUTE ONLY) DME instruction;Stair training;Gait training;Functional mobility training;Therapeutic activities;Therapeutic exercise;Balance training;Patient/family education  AM-PAC PT "6 Clicks" Mobility Outcome Measure (Version 2)  Help needed turning from your back to your side while in a flat bed without using bedrails? 1  Help needed moving from lying on your back to sitting on the side of a flat bed without using bedrails? 1  Help needed moving to and from a bed to a chair (including a wheelchair)? 2  Help needed standing up from a chair using your arms (e.g., wheelchair or bedside chair)? 2  Help needed to walk in hospital room? 2  Help needed climbing 3-5 steps with a railing?  1  6 Click Score 9  Consider Recommendation of Discharge To: CIR/SNF/LTACH  Progressive Mobility  What is the highest level of mobility based on the progressive mobility assessment? Level 4 (Walks with assist in room) - Balance while marching in place and cannot step forward and back - Complete  Mobility Ambulated with assistance in room  PT Recommendation  Follow Up Recommendations Skilled nursing-short term rehab (<3 hours/day)  Assistance recommended at discharge Frequent or constant Supervision/Assistance  Functional Status Assessment Patient has had a recent decline in their functional status and demonstrates the ability to make significant improvements in function in a  reasonable and predictable amount of time.  PT equipment None recommended by PT  Individuals Consulted  Consulted and Agree with Results and Recommendations Patient;Family member/caregiver  Family Member Consulted wife  Acute Rehab PT Goals  Patient Stated Goal none stated  PT Goal Formulation With patient  Time For Goal Achievement 04/16/21  Potential to Achieve Goals Good  PT Time Calculation  PT Start Time (ACUTE ONLY) 1011  PT Stop Time (ACUTE ONLY) 1034  PT Time Calculation (min) (ACUTE ONLY) 23 min  PT General Charges  $$ ACUTE PT VISIT 1 Visit  PT Evaluation  $PT Eval Moderate Complexity 1 Mod  Written Expression  Dominant Hand Right   Pt admitted secondary to problem above with deficits above. Pt requiring mod A +2 for bed mobility and min A +2 to take steps at EOB. Pt with episode of blank stare and slow processing in sitting; VSS. Wife reports this can be normal. Pt has had a recent decline in function as he was previously mod I with RW. Recommending SNF level therapies to address current deficits. Will continue to follow acutely.   Reuel Derby, PT, DPT  Acute Rehabilitation Services  Pager: [phone] Office: [phone]

## 2021-04-02 NOTE — Evaluation (Signed)
Occupational Therapy Evaluation Patient Details Name: Dustin Hamilton MRN: [id number] DOB: 1941/05/02 Today's Date: 04/02/2021   History of Present Illness 80 y.o. male presenting by EMS s/p seizure-like epsiode. PMH is significant for CVA, HTN, pSVT with AVNRT ablation in 11/2018, Mobitz Type 1 AV block, dizziness.   Clinical Impression   PTA pt lives at home with his elderly wife who states he has been in bed x 3 days.  Pt previously independent and now requires assistance with all mobility and ADL tasks. Feel pt would greatly benefit from rehab at SNF to maximize functional level of independence to facilitate safe DC home. Will follow acutely.     Recommendations for follow up therapy are one component of a multi-disciplinary discharge planning process, led by the attending physician.  Recommendations may be updated based on patient status, additional functional criteria and insurance authorization.   Follow Up Recommendations  Skilled nursing-short term rehab (<3 hours/day)    Assistance Recommended at Discharge Frequent or constant Supervision/Assistance  Functional Status Assessment  Patient has had a recent decline in their functional status and demonstrates the ability to make significant improvements in function in a reasonable and predictable amount of time.  Equipment Recommendations  None recommended by OT    Recommendations for Other Services       Precautions / Restrictions Precautions Precautions: Fall      Mobility Bed Mobility Overal bed mobility: Needs Assistance Bed Mobility: Supine to Sit;Sit to Supine     Supine to sit: Mod assist;+2 for safety/equipment Sit to supine: Mod assist;+2 for safety/equipment        Transfers Overall transfer level: Needs assistance Equipment used: Rolling walker (2 wheels) Transfers: Sit to/from Stand Sit to Stand: Min assist;+2 physical assistance                  Balance Overall balance assessment: Needs  assistance   Sitting balance-Leahy Scale: Fair       Standing balance-Leahy Scale: Poor                             ADL either performed or assessed with clinical judgement   ADL Overall ADL's : Needs assistance/impaired     Grooming: Minimal assistance   Upper Body Bathing: Minimal assistance   Lower Body Bathing: Maximal assistance;Sit to/from stand   Upper Body Dressing : Moderate assistance   Lower Body Dressing: Maximal assistance   Toilet Transfer: Minimal assistance;+2 for physical assistance   Toileting- Clothing Manipulation and Hygiene: Total assistance Toileting - Clothing Manipulation Details (indicate cue type and reason): incontinent     Functional mobility during ADLs: Minimal assistance;+2 for physical assistance;Rolling walker (2 wheels)       Vision Baseline Vision/History: 1 Wears glasses;3 Glaucoma Additional Comments: no new changes in vision; (no glasses in room)     Perception     Praxis      Pertinent Vitals/Pain Pain Assessment: No/denies pain     Hand Dominance Right   Extremity/Trunk Assessment Upper Extremity Assessment Upper Extremity Assessment: Generalized weakness   Lower Extremity Assessment Lower Extremity Assessment: Defer to PT evaluation   Cervical / Trunk Assessment Cervical / Trunk Assessment: Other exceptions (posterior bias)   Communication Communication Communication: No difficulties;Expressive difficulties   Cognition Arousal/Alertness: Lethargic Behavior During Therapy: Flat affect Overall Cognitive Status: Impaired/Different from baseline Area of Impairment: Attention;Orientation;Memory;Following commands;Safety/judgement;Awareness;Problem solving  Orientation Level: Disoriented to;Time (unsure of month) Current Attention Level: Sustained Memory: Decreased short-term memory Following Commands: Follows one step commands with increased time Safety/Judgement: Decreased  awareness of safety;Decreased awareness of deficits Awareness: Emergent Problem Solving: Slow processing       General Comments  VSS; moments of staring off and slow response    Exercises     Shoulder Instructions      Home Living Family/patient expects to be discharged to:: Private residence Living Arrangements: Spouse/significant other Available Help at Discharge: Family;Available 24 hours/day Type of Home: House Home Access: Ramped entrance     Home Layout: Two level;Full bath on main level;Bed/bath upstairs;Able to live on main level with bedroom/bathroom     Bathroom Shower/Tub: Walk-in shower;Door   [employer] Toilet: Standard Bathroom Accessibility: No   Home Equipment: Advice worker (2 wheels);Rollator (4 wheels);Cane - single point;Wheelchair - manual          Prior Functioning/Environment Prior Level of Function : Independent/Modified Independent               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        OT Problem List: Decreased strength;Decreased activity tolerance;Impaired balance (sitting and/or standing);Impaired vision/perception;Decreased coordination;Decreased cognition;Decreased safety awareness;Cardiopulmonary status limiting activity;Impaired UE functional use      OT Treatment/Interventions: Self-care/ADL training;Therapeutic exercise;Neuromuscular education;Energy conservation;DME and/or AE instruction;Therapeutic activities;Cognitive remediation/compensation;Visual/perceptual remediation/compensation;Patient/family education;Balance training    OT Goals(Current goals can be found in the care plan section) Acute Rehab OT Goals Patient Stated Goal: to get some rehab before going home OT Goal Formulation: With patient/family Time For Goal Achievement: 04/16/21 Potential to Achieve Goals: Good  OT Frequency: Min 2X/week   Barriers to D/C:            Co-evaluation              AM-PAC OT "6 Clicks" Daily  Activity     Outcome Measure Help from another person eating meals?: A Little Help from another person taking care of personal grooming?: A Little Help from another person toileting, which includes using toliet, bedpan, or urinal?: Total Help from another person bathing (including washing, rinsing, drying)?: A Lot Help from another person to put on and taking off regular upper body clothing?: A Lot Help from another person to put on and taking off regular lower body clothing?: A Lot 6 Click Score: 13   End of Session Equipment Utilized During Treatment: Gait belt;Rolling walker (2 wheels) Nurse Communication: Mobility status  Activity Tolerance: Patient tolerated treatment well Patient left: in bed;with call bell/phone within reach  OT Visit Diagnosis: Unsteadiness on feet (R26.81);Other abnormalities of gait and mobility (R26.89);Muscle weakness (generalized) (M62.81);Low vision, both eyes (H54.2);Other symptoms and signs involving cognitive function                Time: 1011-1033 OT Time Calculation (min): 22 min Charges:  OT General Charges $OT Visit: 1 Visit OT Evaluation $OT Eval Moderate Complexity: Schaller, OT/L   Acute OT Clinical Specialist Acute Rehabilitation Services Pager [phone] Office [phone]   [facility] 04/02/2021, 12:26 PM

## 2021-04-02 NOTE — Telephone Encounter (Signed)
Pt currently admitted. Called to cancel appointment and check on pt. Spoke with wife. Advised them to call back for appointment when pt is feeling better. Wife verbalizes understanding.

## 2021-04-02 NOTE — Progress Notes (Signed)
SLP Cancellation Note  Patient Details Name: Dustin Hamilton MRN: [id number] DOB: 1940/09/16   Cancelled treatment:       Reason Eval/Treat Not Completed: Patient at procedure or test/unavailable Working with PT/OT. Will f/u    Dema Timmons, Katherene Ponto 04/02/2021, 10:20 AM

## 2021-04-03 ENCOUNTER — Ambulatory Visit: Payer: Medicare PPO | Admitting: Cardiovascular Disease

## 2021-04-03 ENCOUNTER — Encounter (HOSPITAL_COMMUNITY): Payer: Self-pay | Admitting: Family Medicine

## 2021-04-03 DIAGNOSIS — N179 Acute kidney failure, unspecified: Secondary | ICD-10-CM

## 2021-04-03 LAB — COMPREHENSIVE METABOLIC PANEL WITH GFR
ALT: 22 U/L (ref 0–44)
AST: 28 U/L (ref 15–41)
Albumin: 2.6 g/dL — ABNORMAL LOW (ref 3.5–5.0)
Alkaline Phosphatase: 54 U/L (ref 38–126)
Anion gap: 8 (ref 5–15)
BUN: 20 mg/dL (ref 8–23)
CO2: 23 mmol/L (ref 22–32)
Calcium: 8.5 mg/dL — ABNORMAL LOW (ref 8.9–10.3)
Chloride: 102 mmol/L (ref 98–111)
Creatinine, Ser: 1 mg/dL (ref 0.61–1.24)
GFR, Estimated: >60 mL/min
Glucose, Bld: 117 mg/dL — ABNORMAL HIGH (ref 70–99)
Potassium: 3.7 mmol/L (ref 3.5–5.1)
Sodium: 133 mmol/L — ABNORMAL LOW (ref 135–145)
Total Bilirubin: 1.1 mg/dL (ref 0.3–1.2)
Total Protein: 5.9 g/dL — ABNORMAL LOW (ref 6.5–8.1)

## 2021-04-03 LAB — CBC
HCT: 38.8 % — ABNORMAL LOW (ref 39.0–52.0)
Hemoglobin: 13.3 g/dL (ref 13.0–17.0)
MCH: 31.2 pg (ref 26.0–34.0)
MCHC: 34.3 g/dL (ref 30.0–36.0)
MCV: 91.1 fL (ref 80.0–100.0)
Platelets: 108 10*3/uL — ABNORMAL LOW (ref 150–400)
RBC: 4.26 MIL/uL (ref 4.22–5.81)
RDW: 13.6 % (ref 11.5–15.5)
WBC: 5.5 10*3/uL (ref 4.0–10.5)
nRBC: 0 % (ref 0.0–0.2)

## 2021-04-03 NOTE — Progress Notes (Signed)
Occupational Therapy Treatment Patient Details Name: Dustin Hamilton MRN: [id number] DOB: Jul 01, 1940 Today's Date: 04/03/2021   History of present illness 80 y.o. male presenting by EMS s/p seizure-like epsiode. imaging showed L cerebellar hemmorhage.  PMH is significant for CVA, HTN, pSVT with AVNRT ablation in 11/2018, Mobitz Type 1 AV block, dizziness.   OT comments  Dustin Hamilton is progressing well with plans to d/c home soon with support of family. He verbalized understanding of safe home set up and compensatory techniques for ADLs to increase safety and decrease fall risk. Pt required mod A for simulated lower body ADLs this session for balance, safety and generalized weakness. He also continues to required assist for sit<>stand from lower surfaces, but does well with elevated surfaces. Pt has great insight into his deficits. Pt will continue to benefit from OT acutely. D/c recommendation updated to home with [facility] given great family support with 24/7 direct physical assistance.    Recommendations for follow up therapy are one component of a multi-disciplinary discharge planning process, led by the attending physician.  Recommendations may be updated based on patient status, additional functional criteria and insurance authorization.    Follow Up Recommendations  Home health OT    Assistance Recommended at Discharge Frequent or constant Supervision/Assistance  Equipment Recommendations  None recommended by OT       Precautions / Restrictions Precautions Precautions: Fall;Other (comment) Precaution Comments: transient episodes of unresponsiveness Restrictions Weight Bearing Restrictions: No       Mobility Bed Mobility Overal bed mobility: Needs Assistance Bed Mobility: Supine to Sit     Supine to sit: Min guard Sit to supine: Min assist   General bed mobility comments: low and deliberate, no physical assist needed    Transfers Overall transfer level: Needs  assistance Equipment used: Standard walker Transfers: Sit to/from Stand Sit to Stand: Min guard;Min assist           General transfer comment: min guard from higher surface, min A from lower surface. pt with unsafe hand placement on RW to stand     Balance Overall balance assessment: Needs assistance Sitting-balance support: Feet supported;No upper extremity supported Sitting balance-Leahy Scale: Good     Standing balance support: Single extremity supported;During functional activity Standing balance-Leahy Scale: Fair Standing balance comment: Reliant on UE support             ADL either performed or assessed with clinical judgement   ADL Overall ADL's : Needs assistance/impaired         Lower Body Dressing: Moderate assistance;Sit to/from stand Lower Body Dressing Details (indicate cue type and reason): Pt able to reach feet to thread pants, assist for balance, standing and pulling pants over hips in standing             Functional mobility during ADLs: Minimal assistance;Rolling walker (2 wheels) General ADL Comments: min A for boosting into standing from low surface, once standing and steadied pt able to complete functional task in static standing with just min guard A     Vision   Vision Assessment?: Vision impaired- to be further tested in functional context Additional Comments: pt reports history of visual deficits, stopped drivign 5 years ago due to vision          Cognition Arousal/Alertness: Awake/alert Behavior During Therapy: Flat affect Overall Cognitive Status: Impaired/Different from baseline Area of Impairment: Attention;Orientation;Memory;Following commands;Safety/judgement;Awareness;Problem solving                 Orientation Level: Disoriented to;Time  Current Attention Level: Sustained Memory: Decreased short-term memory Following Commands: Follows one step commands with increased time Safety/Judgement: Decreased awareness of  safety;Decreased awareness of deficits Awareness: Emergent Problem Solving: Slow processing General Comments: Pt verbalized great understanding of safety for home: use of RW and +1 for home ambulation, sturdy surfaces throughout house to sit if episode of unresponsiveness occurs, sit to shower, etc. Pt with word finding impariments throughout, slow processing                General Comments VSS, pt with word finding problems throughout    Pertinent Vitals/ Pain       Pain Assessment: No/denies pain   Frequency  Min 2X/week        Progress Toward Goals  OT Goals(current goals can now be found in the care plan section)  Progress towards OT goals: Progressing toward goals  Acute Rehab OT Goals Patient Stated Goal: home tomorrow OT Goal Formulation: With patient/family Time For Goal Achievement: 04/16/21 Potential to Achieve Goals: Good ADL Goals Pt Will Perform Lower Body Bathing: sit to/from stand;with supervision Pt Will Perform Lower Body Dressing: with min guard assist;sit to/from stand Pt Will Transfer to Toilet: with supervision;ambulating Pt Will Perform Toileting - Clothing Manipulation and hygiene: with supervision;sit to/from stand  Plan Discharge plan needs to be updated       AM-PAC OT "6 Clicks" Daily Activity     Outcome Measure   Help from another person eating meals?: A Little Help from another person taking care of personal grooming?: A Little Help from another person toileting, which includes using toliet, bedpan, or urinal?: A Little Help from another person bathing (including washing, rinsing, drying)?: A Lot Help from another person to put on and taking off regular upper body clothing?: A Little Help from another person to put on and taking off regular lower body clothing?: A Lot 6 Click Score: 16    End of Session Equipment Utilized During Treatment: Gait belt;Rolling walker (2 wheels)  OT Visit Diagnosis: Unsteadiness on feet (R26.81);Other  abnormalities of gait and mobility (R26.89);Muscle weakness (generalized) (M62.81);Low vision, both eyes (H54.2);Other symptoms and signs involving cognitive function   Activity Tolerance Patient tolerated treatment well   Patient Left in chair;with call bell/phone within reach   Nurse Communication Mobility status        Time: 9678-9381 OT Time Calculation (min): 17 min  Charges: OT General Charges $OT Visit: 1 Visit OT Treatments $Therapeutic Activity: 8-22 mins    Adreena Willits A Sabeen Piechocki 04/03/2021, 5:53 PM

## 2021-04-03 NOTE — Progress Notes (Addendum)
Progress Note    EDMOND GINSBERG  JEH:[id number] DOB: 1941-01-21  DOA: 04/01/2021 PCP: [facility]      Brief Narrative:    Medical records reviewed and are as summarized below:  ALDIN DREES is a 80 y.o. male with history of vascular dementia, hypertension, numerous strokes in 2014, 2020, 2022, prior history of alcohol use disorder, history of cerebral microbleed's.  He follows on the [facility] epilepsy clinic for recurrent spells of decreased responsiveness and blank stares.  He had been started on Keppra for similar spells in 2018.  His wife reported that patient has been having more frequent spells. Keppra was discontinued by [facility] neurology on his recent follow-up.    He presented to the ED on 10/29 for evaluation of fever and difficulty walking.  He was discharged from the ED on 03/31/2021 after an unremarkable work-up.  However, he came back to the ED on 04/01/2021 because of dizziness, generalized weakness and difficulty ambulating.  MRI brain revealed acute microhemorrhage in the left cerebellum.      Assessment/Plan:   Principal Problem:   Nontraumatic cerebral hemorrhage (HCC) Active Problems:   Essential hypertension   Benign prostatic hyperplasia with urinary obstruction   History of stroke   Thrombocytopenia (HCC)   Acute kidney injury ([place])   Fever and chills    Left cerebellar microhemorrhage: This could be hemorrhagic transformation of an ischemic stroke or possibly from cerebral amyloid angiopathy.  Case discussed with neurologist, Dr. Erlinda Hong.  No further work-up indicated.  He said there is no evidence of seizures and patient should follow-up with his neurologist at [facility].  Patient's wife requested to see a neurologist at [facility].  Address and phone number of [place] neurology office was given to the patient's wife.  Difficulty ambulating, unsteady gait: PT and OT recommending discharge to SNF.  However, patient's wife  wants to take him home.  In the meantime, she wants him to continue to work with PT.  Hypertension: Continue antihypertensive  Dyslipidemia: HDL 27, LDL 40.  Continue atorvastatin  AKI: Improved  Thrombocytopenia: Platelet count is stable.  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 Yes; Fluid consistency: Thin  Diet effective now                      Consultants: Neurologist  Procedures: None    Medications:    amLODipine  2.5 mg Oral BID   atorvastatin  20 mg Oral Daily   brimonidine  1 drop Both Eyes BID   dutasteride  0.5 mg Oral Daily   latanoprost  1 drop Both Eyes QHS   memantine  20 mg Oral QHS   metoprolol succinate  12.5 mg Oral QHS   tamsulosin  0.4 mg Oral QHS   Continuous Infusions:   Anti-infectives (From admission, onward)    Start     Dose/Rate Route Frequency Ordered Stop   04/01/21 1400  cefTRIAXone (ROCEPHIN) 1 g in sodium chloride 0.9 % 100 mL IVPB        1 g 200 mL/hr over 30 Minutes Intravenous  Once 04/01/21 1349 04/01/21 1632              Family Communication/Anticipated D/C date and plan/Code Status   DVT prophylaxis: SCD's Start: 04/01/21  1844     Code Status: Full Code  Family Communication: Plan discussed with his wife at the bedside Disposition Plan: PT recommended SNF but his wife wants to take him home   Status is: Inpatient  Remains inpatient appropriate because: Unsafe discharge           Subjective:   Interval events noted.  He has no complaints.  His wife was at the bedside.  Objective:    Vitals:   04/02/21 2342 04/03/21 0418 04/03/21 0700 04/03/21 1114  BP: 123/70 115/70 123/73 123/76  Pulse: 72 70 66 65  Resp: 18 20  20   Temp: 99.8 F (37.7 C) 98 F (36.7 C) 98 F (36.7 C) 98.4 F (36.9 C)  TempSrc: Oral Oral Oral Oral  SpO2: 97%  96% 99% 99%   No data found.   Intake/Output Summary (Last 24 hours) at 04/03/2021 1600 Last data filed at 04/03/2021 1030 Gross per 24 hour  Intake --  Output 450 ml  Net -450 ml   There were no vitals filed for this visit.  Exam:  GEN: NAD SKIN: No rash on exposed skin EYES: EOMI ENT: MMM CV: RRR PULM: CTA B ABD: soft, ND, NT, +BS CNS: AAO x 2 (person and place), non focal EXT: No edema or tenderness        Data Reviewed:   I have personally reviewed following labs and imaging studies:  Labs: Labs show the following:   Basic Metabolic Panel: Recent Labs  Lab 03/30/21 2235 04/01/21 1015 04/02/21 0336 04/03/21 0102  NA 134* 136 134* 133*  K 3.9 4.3 3.7 3.7  CL 104 102 105 102  CO2 21* 25 20* 23  GLUCOSE 152* 115* 88 117*  BUN 15 18 18 20   CREATININE 0.96 1.32* 1.09 1.00  CALCIUM 9.5 9.4 8.9 8.5*  MG  --   --  1.8  --    GFR CrCl cannot be calculated (Unknown ideal weight.). Liver Function Tests: Recent Labs  Lab 03/30/21 2235 04/01/21 1015 04/03/21 0102  AST 22 27 28   ALT 21 23 22   ALKPHOS 69 67 54  BILITOT 0.6 1.7* 1.1  PROT 6.7 6.9 5.9*  ALBUMIN 3.3* 3.3* 2.6*   No results for input(s): LIPASE, AMYLASE in the last 168 hours. No results for input(s): AMMONIA in the last 168 hours. Coagulation profile Recent Labs  Lab 03/30/21 2235  INR 1.0    CBC: Recent Labs  Lab 03/30/21 2235 04/01/21 1015 04/02/21 0336 04/03/21 0102  WBC 5.1 6.3 6.2 5.5  NEUTROABS 4.0 4.9  --   --   HGB 16.0 15.7 14.8 13.3  HCT 46.0 46.6 43.4 38.8*  MCV 91.8 93.2 91.6 91.1  PLT 115* 111* 106* 108*   Cardiac Enzymes: No results for input(s): CKTOTAL, CKMB, CKMBINDEX, TROPONINI in the last 168 hours. BNP (last 3 results) No results for input(s): PROBNP in the last 8760 hours. CBG: Recent Labs  Lab 03/30/21 2217 04/01/21 1014  GLUCAP 169* 120*   D-Dimer: No results for input(s): DDIMER in the last 72 hours. Hgb A1c: Recent Labs     04/02/21 0336  HGBA1C 5.8*   Lipid Profile: Recent Labs    04/02/21 0336  CHOL 78  HDL 27*  LDLCALC 40  TRIG 53  CHOLHDL 2.9   Thyroid function studies: No results for input(s): TSH, T4TOTAL, T3FREE, THYROIDAB in the last 72 hours.  Invalid input(s): FREET3 Anemia work up: No results for input(s): VITAMINB12, FOLATE, FERRITIN, TIBC, IRON, RETICCTPCT  in the last 72 hours. Sepsis Labs: Recent Labs  Lab 03/30/21 2235 04/01/21 1015 04/01/21 1550 04/02/21 0336 04/03/21 0102  WBC 5.1 6.3  --  6.2 5.5  LATICACIDVEN 1.7  --  1.6  --   --     Microbiology Recent Results (from the past 240 hour(s))  Urine Culture     Status: Abnormal   Collection Time: 03/30/21 10:35 PM   Specimen: In/Out Cath Urine  Result Value Ref Range Status   Specimen Description IN/OUT CATH URINE  Final   Special Requests   Final    NONE Performed at [facility], [address], [place], [place] [address]    Culture MULTIPLE SPECIES PRESENT, SUGGEST RECOLLECTION (A)  Final   Report Status 04/01/2021 FINAL  Final  Blood culture (routine x 2)     Status: None (Preliminary result)   Collection Time: 03/30/21 10:50 PM   Specimen: BLOOD RIGHT HAND  Result Value Ref Range Status   Specimen Description BLOOD RIGHT HAND  Final   Special Requests   Final    BOTTLES DRAWN AEROBIC AND ANAEROBIC Blood Culture results may not be optimal due to an inadequate volume of blood received in culture bottles   Culture   Final    NO GROWTH 3 DAYS Performed at [facility], [place] [address] [place], [place] [address]    Report Status PENDING  Incomplete  Resp Panel by RT-PCR (Flu A&B, Covid)     Status: None   Collection Time: 03/31/21 12:16 AM   Specimen: Nasopharyngeal(NP) swabs in vial transport medium  Result Value Ref Range Status   SARS Coronavirus 2 by RT PCR NEGATIVE NEGATIVE Final    Comment: (NOTE) SARS-CoV-2 target nucleic acids are NOT DETECTED.  The SARS-CoV-2 RNA is generally  detectable in upper respiratory specimens during the acute phase of infection. The lowest concentration of SARS-CoV-2 viral copies this assay can detect is 138 copies/mL. A negative result does not preclude SARS-Cov-2 infection and should not be used as the sole basis for treatment or other patient management decisions. A negative result may occur with  improper specimen collection/handling, submission of specimen other than nasopharyngeal swab, presence of viral mutation(s) within the areas targeted by this assay, and inadequate number of viral copies(<138 copies/mL). A negative result must be combined with clinical observations, patient history, and epidemiological information. The expected result is Negative.  Fact Sheet for Patients:  EntrepreneurPulse.com.au  Fact Sheet for Healthcare Providers:  IncredibleEmployment.be  This test is no t yet approved or cleared by the [place] FDA and  has been authorized for detection and/or diagnosis of SARS-CoV-2 by FDA under an Emergency Use Authorization (EUA). This EUA will remain  in effect (meaning this test can be used) for the duration of the COVID-19 declaration under Section 564(b)(1) of the Act, 21 U.S.C.section 360bbb-3(b)(1), unless the authorization is terminated  or revoked sooner.       Influenza A by PCR NEGATIVE NEGATIVE Final   Influenza B by PCR NEGATIVE NEGATIVE Final    Comment: (NOTE) The Xpert Xpress SARS-CoV-2/FLU/RSV plus assay is intended as an aid in the diagnosis of influenza from Nasopharyngeal swab specimens and should not be used as a sole basis for treatment. Nasal washings and aspirates are unacceptable for Xpert Xpress SARS-CoV-2/FLU/RSV testing.  Fact Sheet for Patients: EntrepreneurPulse.com.au  Fact Sheet for Healthcare Providers: IncredibleEmployment.be  This test is not yet approved or cleared by the [place] FDA  and has been  authorized for detection and/or diagnosis of SARS-CoV-2 by FDA under an Emergency Use Authorization (EUA). This EUA will remain in effect (meaning this test can be used) for the duration of the COVID-19 declaration under Section 564(b)(1) of the Act, 21 U.S.C. section 360bbb-3(b)(1), unless the authorization is terminated or revoked.  Performed at [facility], [place] [address], [place], [place] [address]   Blood culture (routine x 2)     Status: None (Preliminary result)   Collection Time: 03/31/21 12:23 AM   Specimen: BLOOD  Result Value Ref Range Status   Specimen Description BLOOD SITE NOT SPECIFIED  Final   Special Requests   Final    BOTTLES DRAWN AEROBIC AND ANAEROBIC Blood Culture results may not be optimal due to an inadequate volume of blood received in culture bottles   Culture   Final    NO GROWTH 3 DAYS Performed at [facility], [place] [address], [place], [place] [address]    Report Status PENDING  Incomplete  Resp Panel by RT-PCR (Flu A&B, Covid) Nasopharyngeal Swab     Status: None   Collection Time: 04/01/21 12:42 PM   Specimen: Nasopharyngeal Swab; Nasopharyngeal(NP) swabs in vial transport medium  Result Value Ref Range Status   SARS Coronavirus 2 by RT PCR NEGATIVE NEGATIVE Final    Comment: (NOTE) SARS-CoV-2 target nucleic acids are NOT DETECTED.  The SARS-CoV-2 RNA is generally detectable in upper respiratory specimens during the acute phase of infection. The lowest concentration of SARS-CoV-2 viral copies this assay can detect is 138 copies/mL. A negative result does not preclude SARS-Cov-2 infection and should not be used as the sole basis for treatment or other patient management decisions. A negative result may occur with  improper specimen collection/handling, submission of specimen other than nasopharyngeal swab, presence of viral mutation(s) within the areas targeted by this assay, and inadequate number of viral copies(<138  copies/mL). A negative result must be combined with clinical observations, patient history, and epidemiological information. The expected result is Negative.  Fact Sheet for Patients:  EntrepreneurPulse.com.au  Fact Sheet for Healthcare Providers:  IncredibleEmployment.be  This test is no t yet approved or cleared by the [place] FDA and  has been authorized for detection and/or diagnosis of SARS-CoV-2 by FDA under an Emergency Use Authorization (EUA). This EUA will remain  in effect (meaning this test can be used) for the duration of the COVID-19 declaration under Section 564(b)(1) of the Act, 21 U.S.C.section 360bbb-3(b)(1), unless the authorization is terminated  or revoked sooner.       Influenza A by PCR NEGATIVE NEGATIVE Final   Influenza B by PCR NEGATIVE NEGATIVE Final    Comment: (NOTE) The Xpert Xpress SARS-CoV-2/FLU/RSV plus assay is intended as an aid in the diagnosis of influenza from Nasopharyngeal swab specimens and should not be used as a sole basis for treatment. Nasal washings and aspirates are unacceptable for Xpert Xpress SARS-CoV-2/FLU/RSV testing.  Fact Sheet for Patients: EntrepreneurPulse.com.au  Fact Sheet for Healthcare Providers: IncredibleEmployment.be  This test is not yet approved or cleared by the [place] FDA and has been authorized for detection and/or diagnosis of SARS-CoV-2 by FDA under an Emergency Use Authorization (EUA). This EUA will remain in effect (meaning this test can be used) for the duration of the COVID-19 declaration under Section 564(b)(1) of the Act, 21 U.S.C. section 360bbb-3(b)(1), unless the authorization is terminated or revoked.  Performed at [facility], [place] [address], [place], [place] [address]  Urine Culture     Status: Abnormal   Collection Time: 04/01/21  1:12 PM   Specimen: Urine, Clean Catch  Result Value Ref Range  Status   Specimen Description URINE, CLEAN CATCH  Final   Special Requests   Final    NONE Performed at [facility], [address], [place], [place] [address]    Culture MULTIPLE SPECIES PRESENT, SUGGEST RECOLLECTION (A)  Final   Report Status 04/02/2021 FINAL  Final  Culture, blood (routine x 2)     Status: None (Preliminary result)   Collection Time: 04/01/21  3:54 PM   Specimen: BLOOD  Result Value Ref Range Status   Specimen Description BLOOD BLOOD RIGHT FOREARM  Final   Special Requests   Final    BOTTLES DRAWN AEROBIC AND ANAEROBIC Blood Culture adequate volume   Culture   Final    NO GROWTH 2 DAYS Performed at [facility], [place] [address] [place], [place] [address]    Report Status PENDING  Incomplete  Culture, blood (routine x 2)     Status: None (Preliminary result)   Collection Time: 04/01/21  3:59 PM   Specimen: BLOOD  Result Value Ref Range Status   Specimen Description BLOOD LEFT WRIST  Final   Special Requests   Final    BOTTLES DRAWN AEROBIC AND ANAEROBIC Blood Culture results may not be optimal due to an inadequate volume of blood received in culture bottles   Culture   Final    NO GROWTH 2 DAYS Performed at [facility], [place] [address] [place], [place] [address]    Report Status PENDING  Incomplete  Respiratory (~20 pathogens) panel by PCR     Status: None   Collection Time: 04/01/21 10:29 PM   Specimen: Nasopharyngeal Swab; Respiratory  Result Value Ref Range Status   Adenovirus NOT DETECTED NOT DETECTED Final   Coronavirus 229E NOT DETECTED NOT DETECTED Final    Comment: (NOTE) The Coronavirus on the Respiratory Panel, DOES NOT test for the novel  Coronavirus (2019 nCoV)    Coronavirus HKU1 NOT DETECTED NOT DETECTED Final   Coronavirus NL63 NOT DETECTED NOT DETECTED Final   Coronavirus OC43 NOT DETECTED NOT DETECTED Final   Metapneumovirus NOT DETECTED NOT DETECTED Final   Rhinovirus / Enterovirus NOT DETECTED NOT DETECTED Final    Influenza A NOT DETECTED NOT DETECTED Final   Influenza B NOT DETECTED NOT DETECTED Final   Parainfluenza Virus 1 NOT DETECTED NOT DETECTED Final   Parainfluenza Virus 2 NOT DETECTED NOT DETECTED Final   Parainfluenza Virus 3 NOT DETECTED NOT DETECTED Final   Parainfluenza Virus 4 NOT DETECTED NOT DETECTED Final   Respiratory Syncytial Virus NOT DETECTED NOT DETECTED Final   Bordetella pertussis NOT DETECTED NOT DETECTED Final   Bordetella Parapertussis NOT DETECTED NOT DETECTED Final   Chlamydophila pneumoniae NOT DETECTED NOT DETECTED Final   Mycoplasma pneumoniae NOT DETECTED NOT DETECTED Final    Comment: Performed at [facility], [place], [place], [place] [address]    Procedures and diagnostic studies:  CT ANGIO HEAD NECK W WO CM  Result Date: 04/01/2021 CLINICAL DATA:  Confusion and loss of balance EXAM: CT ANGIOGRAPHY HEAD AND NECK TECHNIQUE: Multidetector CT imaging of the head and neck was performed using the standard protocol during bolus administration of intravenous contrast. Multiplanar CT image reconstructions and MIPs were obtained to evaluate the vascular anatomy. Carotid stenosis measurements (when applicable) are obtained utilizing NASCET criteria,  using the distal internal carotid diameter as the denominator. CONTRAST:  61mL OMNIPAQUE IOHEXOL 350 MG/ML SOLN COMPARISON:  04/01/2021 FINDINGS: CTA NECK FINDINGS SKELETON: There is no bony spinal canal stenosis. No lytic or blastic lesion. OTHER NECK: Normal pharynx, larynx and major salivary glands. No cervical lymphadenopathy. Unremarkable thyroid gland. UPPER CHEST: Incompletely visualized small right pleural effusion with area of suspected atelectasis in the right middle lobe. AORTIC ARCH: There is no calcific atherosclerosis of the aortic arch. There is no aneurysm, dissection or hemodynamically significant stenosis of the visualized portion of the aorta. Conventional 3 vessel aortic branching pattern. The  visualized proximal subclavian arteries are widely patent. RIGHT CAROTID SYSTEM: Normal without aneurysm, dissection or stenosis. LEFT CAROTID SYSTEM: Normal without aneurysm, dissection or stenosis. VERTEBRAL ARTERIES: Left dominant configuration. Both origins are clearly patent. There is no dissection, occlusion or flow-limiting stenosis to the skull base (V1-V3 segments). CTA HEAD FINDINGS POSTERIOR CIRCULATION: --Vertebral arteries: Normal V4 segments. --Inferior cerebellar arteries: Normal. --Basilar artery: Normal. --Superior cerebellar arteries: Normal. --Posterior cerebral arteries (PCA): Normal. ANTERIOR CIRCULATION: --Intracranial internal carotid arteries: Normal. --Anterior cerebral arteries (ACA): Normal. Both A1 segments are present. Patent anterior communicating artery (a-comm). --Middle cerebral arteries (MCA): Normal. VENOUS SINUSES: As permitted by contrast timing, patent. ANATOMIC VARIANTS: None Review of the MIP images confirms the above findings. IMPRESSION: 1. No emergent large vessel occlusion or high-grade stenosis of the intracranial or cervical arteries. 2. Incompletely visualized small right pleural effusion with area of suspected atelectasis in the right middle lobe. 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 Electronically Signed   By: Ulyses Jarred M.D.   On: 04/01/2021 23:04   EEG adult  Result Date: 04/02/2021 Lora Havens, MD     04/02/2021  8:56 AM Patient Name: MICHAH MINTON MRN: [id number] Epilepsy Attending: Lora Havens Referring Physician/Provider: Anibal Henderson, NP Date: 04/01/2021 Duration: 23.34 mins Patient history: 80 y.o. male with 2 presentations in the past 2 days for evaluation of fevers and weakness impairing his ambulation; also with reports of "spells" where he stares off, is unable to speak, and wants to rest afterward.  EEG to evaluate for seizures. Level of alertness: Awake, asleep AEDs during EEG study: None Technical aspects: This EEG study was done with  scalp electrodes positioned according to the 10-20 International system of electrode placement. Electrical activity was acquired at a sampling rate of 500Hz  and reviewed with a high frequency filter of 70Hz  and a low frequency filter of 1Hz . EEG data were recorded continuously and digitally stored. Description: The posterior dominant rhythm consists of 9-10 Hz activity of moderate voltage (25-35 uV) seen predominantly in posterior head regions, symmetric and reactive to eye opening and eye closing. Sleep was characterized by vertex waves, sleep spindles (12 to 14 Hz), maximal frontocentral region.  Hyperventilation and photic stimulation were not performed.   IMPRESSION: This study is within normal limits. No seizures or epileptiform discharges were seen throughout the recording. Lora Havens   ECHOCARDIOGRAM COMPLETE  Result Date: 04/02/2021    ECHOCARDIOGRAM REPORT   Patient Name:   RACHEL SAMPLES Date of Exam: 04/02/2021 Medical Rec #:  [id number]          Height:       71.0 in Accession #:    [id number]         Weight:       163.4 lb Date of Birth:  03/10/1941          BSA:  1.935 m Patient Age:    78 years           BP:           106/73 mmHg Patient Gender: M                  HR:           84 bpm. Exam Location:  Inpatient Procedure: 2D Echo, Cardiac Doppler, Color Doppler and Intracardiac            Opacification Agent Indications:    Stroke  History:        Patient has prior history of Echocardiogram examinations, most                 recent 05/23/2020. Pacemaker, Stroke, Arrythmias:Tachycardia;                 Risk Factors:Hypertension.  Sonographer:    Glo Herring Referring Phys: [id number] ALLISON WOLFE IMPRESSIONS  1. VIgorous LV function with near mid cavity obliteration. . Left ventricular ejection fraction, by estimation, is 60 to 65%. The left ventricle has normal function. The left ventricle has no regional wall motion abnormalities. There is mild left ventricular hypertrophy. Left  ventricular diastolic parameters are indeterminate.  2. Right ventricular systolic function is normal. The right ventricular size is normal.  3. The mitral valve is normal in structure. Trivial mitral valve regurgitation.  4. The aortic valve is normal in structure. Aortic valve regurgitation is not visualized. No aortic stenosis is present.  5. The inferior vena cava is dilated in size with >50% respiratory variability, suggesting right atrial pressure of 8 mmHg. Comparison(s): The left ventricular function is unchanged. FINDINGS  Left Ventricle: VIgorous LV function with near mid cavity obliteration. Left ventricular ejection fraction, by estimation, is 60 to 65%. The left ventricle has normal function. The left ventricle has no regional wall motion abnormalities. The left ventricular internal cavity size was normal in size. There is mild left ventricular hypertrophy. Left ventricular diastolic parameters are indeterminate. Right Ventricle: The right ventricular size is normal. Right vetricular wall thickness was not assessed. Right ventricular systolic function is normal. Left Atrium: Left atrial size was normal in size. Right Atrium: Right atrial size was normal in size. Pericardium: There is no evidence of pericardial effusion. Mitral Valve: The mitral valve is normal in structure. Trivial mitral valve regurgitation. Tricuspid Valve: The tricuspid valve is grossly normal. Tricuspid valve regurgitation is trivial. Aortic Valve: The aortic valve is normal in structure. Aortic valve regurgitation is not visualized. No aortic stenosis is present. Aortic valve mean gradient measures 3.0 mmHg. Aortic valve peak gradient measures 5.9 mmHg. Pulmonic Valve: The pulmonic valve was not well visualized. Pulmonic valve regurgitation is not visualized. Aorta: The aortic root is normal in size and structure. Venous: The inferior vena cava is dilated in size with greater than 50% respiratory variability, suggesting right  atrial pressure of 8 mmHg. IAS/Shunts: No atrial level shunt detected by color flow Doppler.  LEFT VENTRICLE PLAX 2D LVIDd:         3.70 cm Diastology LVIDs:         2.50 cm LV e' medial:  11.60 cm/s LV PW:         1.20 cm LV e' lateral: 8.16 cm/s LV IVS:        1.20 cm  IVC IVC diam: 2.10 cm LEFT ATRIUM         Index LA diam:    3.90 cm 2.02  cm/m  AORTIC VALVE                   PULMONIC VALVE AV Vmax:           121.00 cm/s PV Vmax:       0.82 m/s AV Vmean:          88.800 cm/s PV Peak grad:  2.7 mmHg AV VTI:            0.183 m AV Peak Grad:      5.9 mmHg AV Mean Grad:      3.0 mmHg LVOT Vmax:         97.90 cm/s LVOT Vmean:        72.100 cm/s LVOT VTI:          0.147 m LVOT/AV VTI ratio: 0.80  AORTA Ao Root diam: 2.90 cm  SHUNTS Systemic VTI: 0.15 m Dorris Carnes MD Electronically signed by Dorris Carnes MD Signature Date/Time: 04/02/2021/4:25:55 PM    Final                LOS: 1 day   Antowan Samford  [facility]   Pager on [email or web address]. If 7PM-7AM, please contact night-coverage at [email or web address]     04/03/2021, 4:00 PM

## 2021-04-03 NOTE — Progress Notes (Signed)
Physical Therapy Treatment Patient Details Name: Dustin Hamilton MRN: [id number] DOB: May 03, 1941 Today's Date: 04/03/2021   History of Present Illness 80 y.o. male presenting by EMS s/p seizure-like epsiode. imaging showed L cerebellar hemmorhage.  PMH is significant for CVA, HTN, pSVT with AVNRT ablation in 11/2018, Mobitz Type 1 AV block, dizziness.    PT Comments    Pt demonstrating steady progress towards his physical therapy goals. No episodes of unresponsiveness this session. Ambulating 100 feet with a walker at a min guard assist level. Pt wife would now like pt to discharge home with HHPT; she states they will have additional family members assist as well. Provided pt wife with gait belt and reviewed home set up and guarding recommendations.   Recommendations for follow up therapy are one component of a multi-disciplinary discharge planning process, led by the attending physician.  Recommendations may be updated based on patient status, additional functional criteria and insurance authorization.  Follow Up Recommendations  Home health PT     Assistance Recommended at Discharge Frequent or constant Supervision/Assistance  Equipment Recommendations  None recommended by PT    Recommendations for Other Services       Precautions / Restrictions Precautions Precautions: Fall;Other (comment) Precaution Comments: transient episodes of unresponsiveness Restrictions Weight Bearing Restrictions: No     Mobility  Bed Mobility Overal bed mobility: Needs Assistance Bed Mobility: Supine to Sit;Sit to Supine     Supine to sit: Min guard Sit to supine: Min assist   General bed mobility comments: MinA for LE negotiation back into bed    Transfers Overall transfer level: Needs assistance Equipment used: Standard walker Transfers: Sit to/from Stand Sit to Stand: Min guard           General transfer comment: Good power up, no physical assist required     Ambulation/Gait Ambulation/Gait assistance: Min guard Gait Distance (Feet): 100 Feet Assistive device: Standard walker Gait Pattern/deviations: Step-through pattern;Shuffle;Decreased stride length;Trunk flexed Gait velocity: Decreased   General Gait Details: Cues for walker proximity and activity pacing, min guard for safety   Stairs             Wheelchair Mobility    Modified Rankin (Stroke Patients Only) Modified Rankin (Stroke Patients Only) Pre-Morbid Rankin Score: Slight disability Modified Rankin: Moderately severe disability     Balance Overall balance assessment: Needs assistance Sitting-balance support: Feet supported;No upper extremity supported Sitting balance-Leahy Scale: Good     Standing balance support: Bilateral upper extremity supported Standing balance-Leahy Scale: Poor Standing balance comment: Reliant on UE support                            Cognition Arousal/Alertness: Awake/alert Behavior During Therapy: Flat affect Overall Cognitive Status: Impaired/Different from baseline Area of Impairment: Attention;Orientation;Memory;Following commands;Safety/judgement;Awareness;Problem solving                 Orientation Level: Disoriented to;Time (unsure of month) Current Attention Level: Sustained Memory: Decreased short-term memory Following Commands: Follows one step commands with increased time Safety/Judgement: Decreased awareness of safety;Decreased awareness of deficits Awareness: Emergent Problem Solving: Slow processing General Comments: Pt not oriented to month (pt wife states he typically does not know this). Pleasant and smiling, mildly impulsive, but follows all commands        Exercises      General Comments        Pertinent Vitals/Pain Pain Assessment: No/denies pain    Home Living  Prior Function            PT Goals (current goals can now be found in the care  plan section) Acute Rehab PT Goals Patient Stated Goal: none stated PT Goal Formulation: With patient Time For Goal Achievement: 04/16/21 Potential to Achieve Goals: Good Progress towards PT goals: Progressing toward goals    Frequency    Min 3X/week      PT Plan Discharge plan needs to be updated    Co-evaluation              AM-PAC PT "6 Clicks" Mobility   Outcome Measure  Help needed turning from your back to your side while in a flat bed without using bedrails?: A Little Help needed moving from lying on your back to sitting on the side of a flat bed without using bedrails?: A Little Help needed moving to and from a bed to a chair (including a wheelchair)?: A Little Help needed standing up from a chair using your arms (e.g., wheelchair or bedside chair)?: A Little Help needed to walk in hospital room?: A Little Help needed climbing 3-5 steps with a railing? : A Lot 6 Click Score: 17    End of Session Equipment Utilized During Treatment: Gait belt Activity Tolerance: Patient tolerated treatment well Patient left: with call bell/phone within reach;with family/visitor present;in bed;with bed alarm set Nurse Communication: Mobility status PT Visit Diagnosis: Unsteadiness on feet (R26.81);Muscle weakness (generalized) (M62.81);Difficulty in walking, not elsewhere classified (R26.2)     Time: 8676-7209 PT Time Calculation (min) (ACUTE ONLY): 25 min  Charges:  $Therapeutic Activity: 23-37 mins                     Dustin Hamilton, PT, DPT Acute Rehabilitation Services Pager [phone] Office [phone]    Dustin Hamilton 04/03/2021, 5:06 PM

## 2021-04-04 ENCOUNTER — Inpatient Hospital Stay (HOSPITAL_COMMUNITY): Payer: No Typology Code available for payment source

## 2021-04-04 LAB — CBC WITH DIFFERENTIAL/PLATELET
Abs Immature Granulocytes: 0.01 10*3/uL (ref 0.00–0.07)
Basophils Absolute: 0 10*3/uL (ref 0.0–0.1)
Basophils Relative: 0 %
Eosinophils Absolute: 0.1 10*3/uL (ref 0.0–0.5)
Eosinophils Relative: 2 %
HCT: 39.4 % (ref 39.0–52.0)
Hemoglobin: 13.6 g/dL (ref 13.0–17.0)
Immature Granulocytes: 0 %
Lymphocytes Relative: 14 %
Lymphs Abs: 0.6 10*3/uL — ABNORMAL LOW (ref 0.7–4.0)
MCH: 31.3 pg (ref 26.0–34.0)
MCHC: 34.5 g/dL (ref 30.0–36.0)
MCV: 90.8 fL (ref 80.0–100.0)
Monocytes Absolute: 0.4 10*3/uL (ref 0.1–1.0)
Monocytes Relative: 8 %
Neutro Abs: 3.5 10*3/uL (ref 1.7–7.7)
Neutrophils Relative %: 76 %
Platelets: 109 10*3/uL — ABNORMAL LOW (ref 150–400)
RBC: 4.34 MIL/uL (ref 4.22–5.81)
RDW: 13.3 % (ref 11.5–15.5)
WBC: 4.6 10*3/uL (ref 4.0–10.5)
nRBC: 0 % (ref 0.0–0.2)

## 2021-04-04 LAB — BASIC METABOLIC PANEL
Anion gap: 6 (ref 5–15)
BUN: 14 mg/dL (ref 8–23)
CO2: 26 mmol/L (ref 22–32)
Calcium: 8.8 mg/dL — ABNORMAL LOW (ref 8.9–10.3)
Chloride: 104 mmol/L (ref 98–111)
Creatinine, Ser: 0.93 mg/dL (ref 0.61–1.24)
GFR, Estimated: >60 mL/min (ref >60)
Glucose, Bld: 99 mg/dL (ref 70–99)
Potassium: 3.6 mmol/L (ref 3.5–5.1)
Sodium: 136 mmol/L (ref 135–145)

## 2021-04-04 LAB — URIC ACID: Uric Acid, Serum: 5.6 mg/dL (ref 3.7–8.6)

## 2021-04-04 MED ORDER — METOPROLOL TARTRATE 5 MG/5ML IV SOLN: 5.0000 mg | INTRAVENOUS | Status: DC | PRN

## 2021-04-04 MED ORDER — METHYLPREDNISOLONE ACETATE 40 MG/ML IJ SUSP
40.0000 mg | Freq: Once | INTRAMUSCULAR | Status: DC
  Filled 2021-04-04 (×2): qty 1

## 2021-04-04 MED ORDER — BUPIVACAINE HCL (PF) 0.5 % IJ SOLN
10.0000 mL | Freq: Once | INTRAMUSCULAR | Status: DC
  Filled 2021-04-04: qty 10

## 2021-04-04 MED ORDER — POTASSIUM CHLORIDE CRYS ER 20 MEQ PO TBCR
40.0000 meq | EXTENDED_RELEASE_TABLET | Freq: Once | ORAL | Status: AC
  Administered 2021-04-04: 40 meq via ORAL
  Filled 2021-04-04: qty 2

## 2021-04-04 MED ORDER — HYDRALAZINE HCL 20 MG/ML IJ SOLN: 10.0000 mg | INTRAMUSCULAR | Status: DC | PRN

## 2021-04-04 MED ORDER — SENNOSIDES-DOCUSATE SODIUM 8.6-50 MG PO TABS: 1.0000 | ORAL_TABLET | Freq: Every evening | ORAL | Status: DC | PRN

## 2021-04-04 MED ORDER — TRAZODONE HCL 50 MG PO TABS
50.0000 mg | ORAL_TABLET | Freq: Every evening | ORAL | Status: DC | PRN
Start: 2021-04-04 — End: 2021-04-06

## 2021-04-04 NOTE — Progress Notes (Addendum)
Physical Therapy Treatment Patient Details Name: Dustin Hamilton MRN: [id number] DOB: Jan 27, 1941 Today's Date: 04/04/2021   History of Present Illness 80 y.o. male presenting by EMS s/p seizure-like epsiode. imaging showed L cerebellar hemmorhage.  PMH is significant for CVA, HTN, pSVT with AVNRT ablation in 11/2018, Mobitz Type 1 AV block, dizziness.    PT Comments    Pt received in supine with wife, Dustin Hamilton, present and pt agreeable to therapy session. During household distance gait task, pt paused and c/o discomfort and needing to sit, BP/HR checked and remained stable throughout session. Pt also c/o right lateral knee pain, R knee slightly warmer than L, RN notified. Emphasis on activity pacing, safe hand placement, and multimodal cues throughout for upright posture with RW. Continue to recommend HHPT upon DC. Pt continues to benefit from PT services to progress toward functional mobility goals.      Recommendations for follow up therapy are one component of a multi-disciplinary discharge planning process, led by the attending physician.  Recommendations may be updated based on patient status, additional functional criteria and insurance authorization.  Follow Up Recommendations  Home health PT     Assistance Recommended at Discharge Frequent or constant Supervision/Assistance  Equipment Recommendations  None recommended by PT (pt has all needed DME)    Recommendations for Other Services       Precautions / Restrictions Precautions Precautions: Fall Precaution Comments: transient episodes of unresponsiveness Restrictions Weight Bearing Restrictions: No     Mobility  Bed Mobility Overal bed mobility: Needs Assistance Bed Mobility: Supine to Sit;Sit to Supine     Supine to sit: Min guard Sit to supine: Min guard   General bed mobility comments: Improved LE negotiation back into bed with no physical assist needed    Transfers Overall transfer level: Needs  assistance Equipment used: Rolling walker (2 wheels) Transfers: Sit to/from Stand Sit to Stand: Min guard;Min assist   General transfer comment: Improved power up upon initiation of rocking technique for momentum, cues for safe hand placement and upright trunk, initially needed +2 minA to stand but after first attempt only needing +1.    Ambulation/Gait Ambulation/Gait assistance: Min guard;Min assist Gait Distance (Feet): 55 Feet (55 ft, sitting rest break, 10 ft) Assistive device: Rolling walker (2 wheels) Gait Pattern/deviations: Step-through pattern;Shuffle;Decreased stride length;Trunk flexed Gait velocity: Decreased   General Gait Details: Cues for walker proximity and upright posture, up to minA for safety     Modified Rankin (Stroke Patients Only) Modified Rankin (Stroke Patients Only) Pre-Morbid Rankin Score: Slight disability Modified Rankin: Moderately severe disability     Balance Overall balance assessment: Needs assistance Sitting-balance support: Feet supported;Bilateral upper extremity supported Sitting balance-Leahy Scale: Good     Standing balance support: Bilateral upper extremity supported;During functional activity;Reliant on assistive device for balance Standing balance-Leahy Scale: Poor     Cognition Arousal/Alertness: Awake/alert Behavior During Therapy: Flat affect (Improving at EOS with more smiling) Overall Cognitive Status: Impaired/Different from baseline Area of Impairment: Attention;Orientation;Memory;Following commands;Safety/judgement;Awareness;Problem solving     Current Attention Level: Sustained Memory: Decreased short-term memory Following Commands: Follows one step commands with increased time;Follows one step commands inconsistently Safety/Judgement: Decreased awareness of safety;Decreased awareness of deficits Awareness: Emergent Problem Solving: Slow processing General Comments: Pt verbalized understanding of home safety  precautions, slow processing        Exercises Other Exercises Other Exercises: STS x6 (x3 reciprocal)    General Comments General comments (skin integrity, edema, etc.): Pt experience episode of dizziness but did not  become unresponsive, was able to keep answering questions, vital signs remained stable throughout session      Pertinent Vitals/Pain Pain Assessment: Faces Faces Pain Scale: Hurts little more Pain Location: Lateral R knee Pain Intervention(s): Monitored during session     PT Goals (current goals can now be found in the care plan section) Acute Rehab PT Goals Patient Stated Goal: none stated PT Goal Formulation: With patient Time For Goal Achievement: 04/16/21 Progress towards PT goals: Progressing toward goals    Frequency    Min 3X/week      PT Plan Current plan remains appropriate       AM-PAC PT "6 Clicks" Mobility   Outcome Measure  Help needed turning from your back to your side while in a flat bed without using bedrails?: A Little Help needed moving from lying on your back to sitting on the side of a flat bed without using bedrails?: A Little Help needed moving to and from a bed to a chair (including a wheelchair)?: A Little Help needed standing up from a chair using your arms (e.g., wheelchair or bedside chair)?: A Little Help needed to walk in hospital room?: A Little Help needed climbing 3-5 steps with a railing? : A Lot 6 Click Score: 17    End of Session Equipment Utilized During Treatment: Gait belt Activity Tolerance: Patient tolerated treatment well;Patient limited by fatigue Patient left: with call bell/phone within reach;with family/visitor present;in bed;with bed alarm set (Pts wife Dustin Hamilton present and supportive throughout) Nurse Communication: Mobility status;Other (comment) (Pt c/o pain in R lateral knee during session, pain increased with light pressure over area, RN notified) PT Visit Diagnosis: Unsteadiness on feet  (R26.81);Muscle weakness (generalized) (M62.81);Difficulty in walking, not elsewhere classified (R26.2)     Time: 1005-1036 PT Time Calculation (min) (ACUTE ONLY): 31 min  Charges:  $Gait Training: 8-22 mins $Therapeutic Activity: 8-22 mins                     Evelene Croon, Student PTA CI: Carly P., PTA  Carly M Poff 04/04/2021, 11:13 AM

## 2021-04-04 NOTE — Progress Notes (Signed)
Occupational Therapy Treatment Patient Details Name: Dustin Hamilton MRN: [id number] DOB: 06/06/1940 Today's Date: 04/04/2021   History of present illness 80 y.o. male presenting by EMS s/p seizure-like epsiode. imaging showed L cerebellar hemmorhage.  PMH is significant for CVA, HTN, pSVT with AVNRT ablation in 11/2018, Mobitz Type 1 AV block, dizziness. Pt with new onset Rt lateral knee pain.  X-ray showed: Age related tricompartmental degenerative changes, most  significant in the lateral compartment; Suspect small joint effusion.   OT comments  Pt lethargic this pm but easily arouses and agreeable to working with OT.  He required increased assist this pm with ADLs and balance - likely due to fatigue.  He performed grooming with min A, and required mod A for sit to stand and to side step up the EOB.  Will continue to follow.    Recommendations for follow up therapy are one component of a multi-disciplinary discharge planning process, led by the attending physician.  Recommendations may be updated based on patient status, additional functional criteria and insurance authorization.    Follow Up Recommendations  Home health OT    Assistance Recommended at Discharge Frequent or constant Supervision/Assistance  Equipment Recommendations  None recommended by OT    Recommendations for Other Services      Precautions / Restrictions Precautions Precautions: Fall       Mobility Bed Mobility Overal bed mobility: Needs Assistance Bed Mobility: Supine to Sit;Sit to Supine     Supine to sit: Min assist Sit to supine: Min assist   General bed mobility comments: assist to lift trunk and assist to lift LEs onto bed    Transfers Overall transfer level: Needs assistance Equipment used: 1 person hand held assist Transfers: Sit to/from Stand Sit to Stand: Mod assist           General transfer comment: pt with posterior lean.  Required assist for balance.  Side stepped up EOB with mod  A     Balance Overall balance assessment: Needs assistance Sitting-balance support: Feet supported;Bilateral upper extremity supported Sitting balance-Leahy Scale: Poor Sitting balance - Comments: pt with posterior bias requiring verbal and occasional physical cues to correct   Standing balance support: Bilateral upper extremity supported;During functional activity;Reliant on assistive device for balance Standing balance-Leahy Scale: Poor Standing balance comment: Reliant on UE support                           ADL either performed or assessed with clinical judgement   ADL Overall ADL's : Needs assistance/impaired     Grooming: Wash/dry hands;Wash/dry face;Oral care;Minimal assistance;Sitting Grooming Details (indicate cue type and reason): assist for sequencing and problem solving                 Toilet Transfer: Moderate assistance;Stand-pivot;BSC           Functional mobility during ADLs: Moderate assistance       Vision   Vision Assessment?: Vision impaired- to be further tested in functional context   Perception     Praxis      Cognition Arousal/Alertness: Lethargic Behavior During Therapy: Flat affect Overall Cognitive Status: History of cognitive impairments - at baseline                                 General Comments: Pt followed one step commands, but is slow to respond.  He require min -  mod cues for sequencing and problem solving during ADLs          Exercises     Shoulder Instructions       General Comments      Pertinent Vitals/ Pain       Pain Assessment: Faces Faces Pain Scale: Hurts a little bit Pain Location: Lateral R knee Pain Descriptors / Indicators: Aching Pain Intervention(s): Monitored during session  Home Living     Available Help at Discharge: Family;Available 24 hours/day Type of Home: House                              Lives With: Spouse    Prior Functioning/Environment               Frequency  Min 2X/week        Progress Toward Goals  OT Goals(current goals can now be found in the care plan section)  Progress towards OT goals: Not progressing toward goals - comment (due to fatigue)     Plan Discharge plan needs to be updated    Co-evaluation                 AM-PAC OT "6 Clicks" Daily Activity     Outcome Measure   Help from another person eating meals?: A Little Help from another person taking care of personal grooming?: A Little Help from another person toileting, which includes using toliet, bedpan, or urinal?: A Lot Help from another person bathing (including washing, rinsing, drying)?: A Lot Help from another person to put on and taking off regular upper body clothing?: A Lot Help from another person to put on and taking off regular lower body clothing?: A Lot 6 Click Score: 14    End of Session    OT Visit Diagnosis: Unsteadiness on feet (R26.81);Other abnormalities of gait and mobility (R26.89);Muscle weakness (generalized) (M62.81);Low vision, both eyes (H54.2);Other symptoms and signs involving cognitive function   Activity Tolerance Patient limited by fatigue   Patient Left in bed;with call bell/phone within reach;with bed alarm set   Nurse Communication Mobility status        Time: 1540-1556 OT Time Calculation (min): 16 min  Charges: OT General Charges $OT Visit: 1 Visit OT Treatments $Self Care/Home Management : 8-22 mins  [name] OTR/L Acute Rehabilitation Services Pager [phone] Office [phone]   [name] M 04/04/2021, 5:01 PM

## 2021-04-04 NOTE — Progress Notes (Signed)
OT Cancellation Note  Patient Details Name: Dustin Hamilton MRN: [id number] DOB: 1941/02/03   Cancelled Treatment:    Reason Eval/Treat Not Completed: Patient at procedure or test/ unavailable.  Pt in x-ray then working with SLP.  Will reattempt as able.  [name]., OTR/L Acute Rehabilitation Services Pager [phone] Office [phone]   [name] M 04/04/2021, 1:31 PM

## 2021-04-04 NOTE — Evaluation (Signed)
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   Fever and chills 04/01/2021   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   Complete heart block ([place])    Heart block    Syncope 05/03/2020   History of stroke 05/03/2020   History of supraventricular tachycardia 05/03/2020   Thrombocytopenia ([place]) 05/03/2020   Acute kidney injury ([place]) 05/03/2020   Memory impairment 05/03/2020   Atypical chest pain 01/27/2020   SVT (supraventricular tachycardia) ([place]) 06/03/2017   Dizziness 08/02/2014   CVA (cerebral infarction) 09/13/2012   Vertigo 09/12/2012   EAR PAIN 09/05/2009   BACK PAIN 06/20/2009   COLONIC POLYPS 05/06/2007   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   NONDEPENDENT ALCOHOL ABUSE IN REMISSION 05/06/2007   Essential hypertension 05/06/2007   Hypertensive heart disease without heart failure 05/06/2007   INTRACRANIAL HEMORRHAGE 05/06/2007   Cerebrovascular disease 05/06/2007   HEMORRHOIDS 05/06/2007   INGUINAL HERNIA, RIGHT 05/06/2007   RENAL CYST 05/06/2007   HEMATURIA UNSPECIFIED 05/06/2007   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   PSA, INCREASED 05/06/2007   LIVER FUNCTION TESTS, ABNORMAL 05/06/2007   Benign prostatic hyperplasia with urinary obstruction 05/06/2007   Past Medical History:  Past Medical History:  Diagnosis Date   BPH (benign prostatic hyperplasia)    Cerebrovascular disease    Congenital anomaly of diaphragm    Elevated PSA    Glaucoma, both eyes    Hemorrhoid    Hepatitis B surface antigen positive    02-20-2011   History of adenomatous polyp of colon    2007, 2009 and 2013  tubular adenoma's   History of alcohol abuse    quit 1963   History of cerebral parenchymal hemorrhage    01/  2006  left occiptial lobe related to hypertensive crisis   History of CVA (cerebrovascular accident)    09-12-2012  left hippocampus/ amygdala junction and per MRI old white matter infarcts--  per pt residual short- term memory issues   History of fatty infiltration of liver hx visit's at [facility] , last visit 05/ 2014   elvated LFT's ,  via liver bx 2004 related to hx alcohol and drug abuse (quit 1964)   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   HTN (hypertension)    Renal cyst, left    Stroke ([place])    hx of 3 strokes in past    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    Urethral lesion    urethral mass   Past Surgical History:  Past Surgical History:  Procedure Laterality Date   CARDIOVASCULAR STRESS TEST  05/05/2007   normal nuclear study w/ no ischemia/  normal LV fucntion and wall motion , ef60%   COLONOSCOPY  last one 04-06-2012   CYSTO/  LEFT RETROGRADE PYELOGRAM/ CYTOLOGY WASHINGS/  URETEROSCOPY  03/05/2000   INGUINAL HERNIA REPAIR Bilateral 1965 and 1980's   LAPAROSCOPIC INGUINAL HERNIA WITH UMBILICAL HERNIA Right 62/94/7654   LIVER BIOPSY  1980's and 2004   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   Procedure: PACEMAKER IMPLANT;  Surgeon: Evans Lance, MD;  Location: [facility];  Service: Cardiovascular;  Laterality: N/A;   SVT ABLATION N/A 11/15/2018   Procedure: SVT ABLATION;  Surgeon: Evans Lance, MD;  Location: [facility];  Service: Cardiovascular;  Laterality: N/A;   TRANSTHORACIC ECHOCARDIOGRAM  09/13/2012   moderate LVH,  ef 60-65%/     TRANSURETHRAL RESECTION OF BLADDER TUMOR N/A 08/11/2016   Procedure: TRANSURETHRAL RESECTION OF BLADDER TUMOR (TURBT);  Surgeon: Cleon Gustin, MD;  [facility];  Service: Urology;  Laterality: N/A;   HPI:  80yo male admitted 04/01/21 wtih fever and difficulty walking. PMH: vascular dementia, intermittent symptomatic hypotension,  dizziness, cerebral microbleeds, HTN, CVAs, L occipital hemorrhage (2006), seizure, BPH, complete heart block s/p pacemaker, thrombocytopenia. Initially to ED 03/30/21 due to poor PO intake.  MRI brain reviewed acute microhemorrhage in the left cerebellum.   Assessment / Plan / Recommendation Clinical Impression  Pt was seen at bedside for assessment of cognitive linguistic function. Wife reports vascular dementia was diagnosed 3 years ago. The Mini-Mental State Examination (MMSE) was administered. Pt scored 23/30, consistent with neurocognitive deficits. Pt was accurate for orientation information except for the date. Good immediate recall of 3/3 unrelated words. Pt had mild difficulty spelling "world" backwards (3/5 points). Delayed recall of 2/3 unrelated words independently. Naming, repetition, following directions (2/3 steps), reading and writing tasks were completed with minimal difficulty. Pt struggled with design copying and clock drawing task, raising concern for executive function deficits.   Recommend home health speech intervention for short term therapy to facilitate establishment of routines and memory strategies to maximize safety and independence. No further acute ST intervention is recommended at this time. Please reconsult if needs arise.    SLP Assessment  SLP Recommendation/Assessment: All further Speech Language Pathology needs can be addressed in the next venue of care (home health)  SLP Visit Diagnosis: Cognitive communication deficit (R41.841)    Recommendations for follow up therapy are one component of a multi-disciplinary discharge planning process, led by the attending physician.  Recommendations may be updated based on patient status, additional functional criteria and insurance authorization.    Follow Up Recommendations  Home health SLP          SLP Evaluation Cognition  Overall Cognitive Status: History of cognitive impairments - at baseline Arousal/Alertness:  Awake/alert Orientation Level: Oriented to person;Oriented to place;Oriented to time Year: 2022 Month: November Day of Week: Correct       Comprehension  Auditory Comprehension Overall Auditory Comprehension: Appears within functional limits for tasks assessed Reading Comprehension Reading Status: Within funtional limits    Expression Expression Primary Mode of Expression: Verbal Verbal Expression Overall Verbal Expression: Appears within functional limits for tasks assessed Written Expression Dominant Hand: Right Written Expression: Within Functional Limits   Oral / Motor  Oral Motor/Sensory Function Overall Oral Motor/Sensory Function: Mild impairment Motor Speech Overall Motor Speech: Appears within functional limits for tasks assessed Intelligibility: Intelligibility reduced Word: 75-100% accurate Phrase: 75-100% accurate Sentence: 75-100% accurate Conversation: 75-100% accurate   GO                   October Peery B. Quentin Ore, [facility], [place] Speech Language Pathologist Office: [phone]  Shonna Chock 04/04/2021, 2:13 PM

## 2021-04-04 NOTE — Progress Notes (Signed)
PROGRESS NOTE    Dustin Hamilton  XVQ:[id number] DOB: [birth year] DOA: 04/01/2021 PCP: [facility]   Brief Narrative:  80 y.o. male with history of vascular dementia, hypertension, numerous strokes in 2014, 2020, 2022, prior history of alcohol use disorder, history of cerebral microbleed's.  He follows on the [facility] epilepsy clinic for recurrent spells of decreased responsiveness and blank stares.  He had been started on Keppra for similar spells in 2018.  His wife reported that patient has been having more frequent spells. Keppra was discontinued by [facility] neurology on his recent follow-up.   He presented to the ED on 10/29 for evaluation of fever and difficulty walking.  He was discharged from the ED on 03/31/2021 after an unremarkable work-up.  However, he came back to the ED on 04/01/2021 because of dizziness, generalized weakness and difficulty ambulating.  MRI brain reviewed acute microhemorrhage in the left cerebellum.   Assessment & Plan:   Principal Problem:   Nontraumatic cerebral hemorrhage (HCC) Active Problems:   Essential hypertension   Benign prostatic hyperplasia with urinary obstruction   History of stroke   Thrombocytopenia (HCC)   Acute kidney injury ([place])   Fever and chills    Left cerebellar microhemorrhage:  -CT of the head showed left cerebellar stroke confirmed on MRI with microhemorrhage.  CT of the head and neck did not show any large vessel occlusion.  LDL 40, A1c 5.8, echocardiogram EF 60 to 65%.  Seen by neurology, not on any antithrombotics due to microhemorrhages.  PT recommending SNF but family would like to take the patient home with home health services.  He can follow-up with outpatient [facility] at [facility]  Difficulty ambulating with severe right-sided knee pain - We will check uric acid, get x-ray.  If there is evidence of effusion, he may require arthrocentesis.  Hypertension: - Continue antihypertensive.  Goal is systolic  [id number]   Dyslipidemia: - LDL 40, Lipitor decreased to 20 mg daily   AKI: Improved   Thrombocytopenia: - Platelet count is stable.   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  Not safe for discharge       Subjective: Having severe right knee pain causing limited mobility.  No other complaints.  Eventually would like to go home with home health services  Review of Systems Otherwise negative except as per HPI, including: General: Denies fever, chills, night sweats or unintended weight loss. Resp: Denies cough, wheezing, shortness of breath. Cardiac: Denies chest pain, palpitations, orthopnea, paroxysmal nocturnal dyspnea. GI: Denies abdominal pain, nausea, vomiting, diarrhea or constipation GU: Denies dysuria, frequency, hesitancy or incontinence MS: Denies muscle aches, joint pain or swelling Neuro: Denies headache, neurologic deficits (focal weakness, numbness, tingling), abnormal gait Psych: Denies anxiety, depression, SI/HI/AVH Skin: Denies new rashes or lesions ID: Denies sick contacts, exotic exposures, travel  Examination:  General exam: Appears calm and comfortable  Respiratory system: Clear to auscultation. Respiratory effort normal. Cardiovascular system: S1 & S2 heard, RRR. No JVD, murmurs, rubs, gallops or clicks. No pedal edema. Gastrointestinal system: Abdomen is nondistended, soft and nontender. No organomegaly or masses felt. Normal bowel sounds heard. Central nervous system: Alert and oriented. No  focal neurological deficits. Extremities: Right knee is tender to  touch and limited range of motion.  Some swelling also noted Skin: No rashes, lesions or ulcers Psychiatry: Judgement and insight appear normal. Mood & affect appropriate.     Objective: Vitals:   04/03/21 2153 04/03/21 2331 04/04/21 0341 04/04/21 0839  BP: 139/88 102/70 118/71 119/78  Pulse: 76 72 70 67  Resp: 16 18 16 16   Temp: 98.7 F (37.1 C)  98 F (36.7 C) 98.7 F (37.1 C)  TempSrc: Oral  Oral Oral  SpO2: 96% 93% 95% 95%    Intake/Output Summary (Last 24 hours) at 04/04/2021 1124 Last data filed at 04/04/2021 0300 Gross per 24 hour  Intake --  Output 400 ml  Net -400 ml   There were no vitals filed for this visit.   Data Reviewed:   CBC: Recent Labs  Lab 03/30/21 2235 04/01/21 1015 04/02/21 0336 04/03/21 0102 04/04/21 0326  WBC 5.1 6.3 6.2 5.5 4.6  NEUTROABS 4.0 4.9  --   --  3.5  HGB 16.0 15.7 14.8 13.3 13.6  HCT 46.0 46.6 43.4 38.8* 39.4  MCV 91.8 93.2 91.6 91.1 90.8  PLT 115* 111* 106* 108* 573*   Basic Metabolic Panel: Recent Labs  Lab 03/30/21 2235 04/01/21 1015 04/02/21 0336 04/03/21 0102 04/04/21 0326  NA 134* 136 134* 133* 136  K 3.9 4.3 3.7 3.7 3.6  CL 104 102 105 102 104  CO2 21* 25 20* 23 26  GLUCOSE 152* 115* 88 117* 99  BUN 15 18 18 20 14   CREATININE 0.96 1.32* 1.09 1.00 0.93  CALCIUM 9.5 9.4 8.9 8.5* 8.8*  MG  --   --  1.8  --   --    GFR: CrCl cannot be calculated (Unknown ideal weight.). Liver Function Tests: Recent Labs  Lab 03/30/21 2235 04/01/21 1015 04/03/21 0102  AST 22 27 28   ALT 21 23 22   ALKPHOS 69 67 54  BILITOT 0.6 1.7* 1.1  PROT 6.7 6.9 5.9*  ALBUMIN 3.3* 3.3* 2.6*   No results for input(s): LIPASE, AMYLASE in the last 168 hours. No results for input(s): AMMONIA in the last 168 hours. Coagulation Profile: Recent Labs  Lab 03/30/21 2235  INR 1.0   Cardiac Enzymes: No results for input(s): CKTOTAL, CKMB, CKMBINDEX, TROPONINI in the last 168 hours. BNP (last 3 results) No results for input(s):  PROBNP in the last 8760 hours. HbA1C: Recent Labs    04/02/21 0336  HGBA1C 5.8*   CBG: Recent Labs  Lab 03/30/21 2217 04/01/21 1014  GLUCAP 169* 120*   Lipid Profile: Recent Labs    04/02/21 0336  CHOL 78  HDL 27*  LDLCALC 40  TRIG 53  CHOLHDL 2.9   Thyroid Function Tests: No results for input(s): TSH, T4TOTAL, FREET4, T3FREE, THYROIDAB in the last 72 hours. Anemia Panel: No results for input(s): VITAMINB12, FOLATE, FERRITIN, TIBC, IRON, RETICCTPCT in the last 72 hours. Sepsis Labs: Recent Labs  Lab 03/30/21 2235 04/01/21 1550  LATICACIDVEN 1.7 1.6    Recent Results (from the past 240 hour(s))  Urine Culture     Status: Abnormal   Collection Time: 03/30/21 10:35 PM   Specimen: In/Out Cath Urine  Result Value Ref Range Status   Specimen Description IN/OUT CATH URINE  Final   Special Requests   Final    NONE Performed at [facility], [place] [address] [place], [place] [address]    Culture MULTIPLE SPECIES PRESENT, SUGGEST RECOLLECTION (A)  Final   Report Status 04/01/2021 FINAL  Final  Blood culture (routine x 2)     Status: None (Preliminary result)   Collection Time: 03/30/21 10:50 PM   Specimen: BLOOD RIGHT HAND  Result Value Ref Range Status   Specimen Description BLOOD RIGHT HAND  Final   Special Requests   Final    BOTTLES DRAWN AEROBIC AND ANAEROBIC Blood Culture results may not be optimal due to an inadequate volume of blood received in culture bottles   Culture   Final    NO GROWTH 4 DAYS Performed at [facility], [place] [address] [place], [place] [address]    Report Status PENDING  Incomplete  Resp Panel by RT-PCR (Flu A&B, Covid)     Status: None   Collection Time: 03/31/21 12:16 AM   Specimen: Nasopharyngeal(NP) swabs in vial transport medium  Result Value Ref Range Status   SARS Coronavirus 2 by RT PCR NEGATIVE NEGATIVE Final    Comment: (NOTE) SARS-CoV-2 target nucleic acids are NOT DETECTED.  The SARS-CoV-2 RNA is generally  detectable in upper respiratory specimens during the acute phase of infection. The lowest concentration of SARS-CoV-2 viral copies this assay can detect is 138 copies/mL. A negative result does not preclude SARS-Cov-2 infection and should not be used as the sole basis for treatment or other patient management decisions. A negative result may occur with  improper specimen collection/handling, submission of specimen other than nasopharyngeal swab, presence of viral mutation(s) within the areas targeted by this assay, and inadequate number of viral copies(<138 copies/mL). A negative result must be combined with clinical observations, patient history, and epidemiological information. The expected result is Negative.  Fact Sheet for Patients:  EntrepreneurPulse.com.au  Fact Sheet for Healthcare Providers:  IncredibleEmployment.be  This test is no t yet approved or cleared by the [place] FDA and  has been authorized for detection and/or diagnosis of SARS-CoV-2 by FDA under an Emergency Use Authorization (EUA). This EUA will remain  in effect (meaning this test can be used) for the duration of the COVID-19 declaration under Section 564(b)(1) of the Act, 21 U.S.C.section 360bbb-3(b)(1), unless the authorization is terminated  or revoked sooner.       Influenza A by PCR NEGATIVE NEGATIVE Final   Influenza B by PCR NEGATIVE NEGATIVE Final    Comment: (NOTE) The Xpert Xpress SARS-CoV-2/FLU/RSV plus assay is intended as an aid in the diagnosis of influenza from Nasopharyngeal swab specimens and should not be used as a sole basis for treatment. Nasal washings and aspirates are unacceptable for Xpert Xpress SARS-CoV-2/FLU/RSV testing.  Fact Sheet for Patients: EntrepreneurPulse.com.au  Fact Sheet for Healthcare Providers: IncredibleEmployment.be  This test is not yet approved or cleared by the [place] FDA  and has been authorized for detection and/or diagnosis of SARS-CoV-2 by FDA under an Emergency Use Authorization (EUA). This EUA will remain in effect (meaning this test can be used) for the duration of the COVID-19 declaration under Section 564(b)(1) of the Act, 21 U.S.C. section 360bbb-3(b)(1), unless the authorization is terminated or revoked.  Performed at [facility], [place] [address] [place], [place] [address]   Blood culture (routine x 2)     Status: None (Preliminary result)   Collection Time: 03/31/21 12:23 AM   Specimen: BLOOD  Result Value Ref Range Status   Specimen Description BLOOD SITE NOT SPECIFIED  Final   Special Requests   Final    BOTTLES DRAWN AEROBIC AND ANAEROBIC Blood Culture results may  not be optimal due to an inadequate volume of blood received in culture bottles   Culture   Final    NO GROWTH 4 DAYS Performed at [facility], [place] [address] [place], [place] [address]    Report Status PENDING  Incomplete  Resp Panel by RT-PCR (Flu A&B, Covid) Nasopharyngeal Swab     Status: None   Collection Time: 04/01/21 12:42 PM   Specimen: Nasopharyngeal Swab; Nasopharyngeal(NP) swabs in vial transport medium  Result Value Ref Range Status   SARS Coronavirus 2 by RT PCR NEGATIVE NEGATIVE Final    Comment: (NOTE) SARS-CoV-2 target nucleic acids are NOT DETECTED.  The SARS-CoV-2 RNA is generally detectable in upper respiratory specimens during the acute phase of infection. The lowest concentration of SARS-CoV-2 viral copies this assay can detect is 138 copies/mL. A negative result does not preclude SARS-Cov-2 infection and should not be used as the sole basis for treatment or other patient management decisions. A negative result may occur with  improper specimen collection/handling, submission of specimen other than nasopharyngeal swab, presence of viral mutation(s) within the areas targeted by this assay, and inadequate number of viral copies(<138  copies/mL). A negative result must be combined with clinical observations, patient history, and epidemiological information. The expected result is Negative.  Fact Sheet for Patients:  EntrepreneurPulse.com.au  Fact Sheet for Healthcare Providers:  IncredibleEmployment.be  This test is no t yet approved or cleared by the [place] FDA and  has been authorized for detection and/or diagnosis of SARS-CoV-2 by FDA under an Emergency Use Authorization (EUA). This EUA will remain  in effect (meaning this test can be used) for the duration of the COVID-19 declaration under Section 564(b)(1) of the Act, 21 U.S.C.section 360bbb-3(b)(1), unless the authorization is terminated  or revoked sooner.       Influenza A by PCR NEGATIVE NEGATIVE Final   Influenza B by PCR NEGATIVE NEGATIVE Final    Comment: (NOTE) The Xpert Xpress SARS-CoV-2/FLU/RSV plus assay is intended as an aid in the diagnosis of influenza from Nasopharyngeal swab specimens and should not be used as a sole basis for treatment. Nasal washings and aspirates are unacceptable for Xpert Xpress SARS-CoV-2/FLU/RSV testing.  Fact Sheet for Patients: EntrepreneurPulse.com.au  Fact Sheet for Healthcare Providers: IncredibleEmployment.be  This test is not yet approved or cleared by the [place] FDA and has been authorized for detection and/or diagnosis of SARS-CoV-2 by FDA under an Emergency Use Authorization (EUA). This EUA will remain in effect (meaning this test can be used) for the duration of the COVID-19 declaration under Section 564(b)(1) of the Act, 21 U.S.C. section 360bbb-3(b)(1), unless the authorization is terminated or revoked.  Performed at [facility], [place] [address], [place], [place] [address]   Urine Culture     Status: Abnormal   Collection Time: 04/01/21  1:12 PM   Specimen: Urine, Clean Catch  Result Value Ref Range  Status   Specimen Description URINE, CLEAN CATCH  Final   Special Requests   Final    NONE Performed at [facility], [place] [address] [place], [place] [address]    Culture MULTIPLE SPECIES PRESENT, SUGGEST RECOLLECTION (A)  Final   Report Status 04/02/2021 FINAL  Final  Culture, blood (routine x 2)     Status: None (Preliminary result)   Collection Time: 04/01/21  3:54 PM   Specimen: BLOOD  Result Value Ref Range Status   Specimen Description BLOOD BLOOD RIGHT FOREARM  Final  Special Requests   Final    BOTTLES DRAWN AEROBIC AND ANAEROBIC Blood Culture adequate volume   Culture   Final    NO GROWTH 3 DAYS Performed at [facility], [place] [address] [place], [place] [address]    Report Status PENDING  Incomplete  Culture, blood (routine x 2)     Status: None (Preliminary result)   Collection Time: 04/01/21  3:59 PM   Specimen: BLOOD  Result Value Ref Range Status   Specimen Description BLOOD LEFT WRIST  Final   Special Requests   Final    BOTTLES DRAWN AEROBIC AND ANAEROBIC Blood Culture results may not be optimal due to an inadequate volume of blood received in culture bottles   Culture   Final    NO GROWTH 3 DAYS Performed at [facility], [place] [address], [place], [place] [address]    Report Status PENDING  Incomplete  Respiratory (~20 pathogens) panel by PCR     Status: None   Collection Time: 04/01/21 10:29 PM   Specimen: Nasopharyngeal Swab; Respiratory  Result Value Ref Range Status   Adenovirus NOT DETECTED NOT DETECTED Final   Coronavirus 229E NOT DETECTED NOT DETECTED Final    Comment: (NOTE) The Coronavirus on the Respiratory Panel, DOES NOT test for the novel  Coronavirus (2019 nCoV)    Coronavirus HKU1 NOT DETECTED NOT DETECTED Final   Coronavirus NL63 NOT DETECTED NOT DETECTED Final   Coronavirus OC43 NOT DETECTED NOT DETECTED Final   Metapneumovirus NOT DETECTED NOT DETECTED Final   Rhinovirus / Enterovirus NOT DETECTED NOT DETECTED Final    Influenza A NOT DETECTED NOT DETECTED Final   Influenza B NOT DETECTED NOT DETECTED Final   Parainfluenza Virus 1 NOT DETECTED NOT DETECTED Final   Parainfluenza Virus 2 NOT DETECTED NOT DETECTED Final   Parainfluenza Virus 3 NOT DETECTED NOT DETECTED Final   Parainfluenza Virus 4 NOT DETECTED NOT DETECTED Final   Respiratory Syncytial Virus NOT DETECTED NOT DETECTED Final   Bordetella pertussis NOT DETECTED NOT DETECTED Final   Bordetella Parapertussis NOT DETECTED NOT DETECTED Final   Chlamydophila pneumoniae NOT DETECTED NOT DETECTED Final   Mycoplasma pneumoniae NOT DETECTED NOT DETECTED Final    Comment: Performed at [facility], [place]. [address], [place], [place] [address]         Radiology Studies: No results found.      Scheduled Meds:  amLODipine  2.5 mg Oral BID   atorvastatin  20 mg Oral Daily   brimonidine  1 drop Both Eyes BID   dutasteride  0.5 mg Oral Daily   latanoprost  1 drop Both Eyes QHS   memantine  20 mg Oral QHS   metoprolol succinate  12.5 mg Oral QHS   tamsulosin  0.4 mg Oral QHS   Continuous Infusions:   LOS: 2 days   Time spent= 35 mins    Isidora Laham Arsenio Loader, MD [facility]  If 7PM-7AM, please contact night-coverage  04/04/2021, 11:24 AM

## 2021-04-04 NOTE — Consult Note (Signed)
Reason for Consult:Right knee effusion Referring Physician: Gerlean Ren Time called: 1320 Time at bedside: Brethren is an 80 y.o. male.  HPI: Dustin Hamilton was admitted a few days ago with a cerebral infarction. He began to c/o severe right knee pain over last 24h. X-rays showed a small effusion and orthopedic surgery was consulted. He does have a hx/o gout.  Past Medical History:  Diagnosis Date   BPH (benign prostatic hyperplasia)    Cerebrovascular disease    Congenital anomaly of diaphragm    Elevated PSA    Glaucoma, both eyes    Hemorrhoid    Hepatitis B surface antigen positive    02-20-2011   History of adenomatous polyp of colon    2007, 2009 and 2013  tubular adenoma's   History of alcohol abuse    quit 1963   History of cerebral parenchymal hemorrhage    01/ 2006  left occiptial lobe related to hypertensive crisis   History of CVA (cerebrovascular accident)    09-12-2012  left hippocampus/ amygdala junction and per MRI old white matter infarcts--  per pt residual short- term memory issues   History of fatty infiltration of liver hx visit's at [facility] , last visit 05/ 2014   elvated LFT's ,  via liver bx 2004 related to hx alcohol and drug abuse (quit 1964)   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   HTN (hypertension)    Renal cyst, left    Stroke ([place])    hx of 3 strokes in past    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    Urethral lesion    urethral mass    Past Surgical History:  Procedure Laterality Date   CARDIOVASCULAR STRESS TEST  05/05/2007   normal nuclear study w/ no ischemia/  normal LV fucntion and wall motion , ef60%   COLONOSCOPY  last one 04-06-2012   CYSTO/  LEFT RETROGRADE PYELOGRAM/ CYTOLOGY WASHINGS/  URETEROSCOPY  03/05/2000   INGUINAL HERNIA REPAIR Bilateral 1965 and 1980's   LAPAROSCOPIC INGUINAL HERNIA WITH UMBILICAL HERNIA Right 24/26/8341   LIVER BIOPSY  1980's  and 2004   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   Procedure: PACEMAKER IMPLANT;  Surgeon: Evans Lance, MD;  Location: [facility];  Service: Cardiovascular;  Laterality: N/A;   SVT ABLATION N/A 11/15/2018   Procedure: SVT ABLATION;  Surgeon: Evans Lance, MD;  Location: [facility];  Service: Cardiovascular;  Laterality: N/A;   TRANSTHORACIC ECHOCARDIOGRAM  09/13/2012   moderate LVH,  ef 60-65%/     TRANSURETHRAL RESECTION OF BLADDER TUMOR N/A 08/11/2016   Procedure: TRANSURETHRAL RESECTION OF BLADDER TUMOR (TURBT);  Surgeon: Cleon Gustin, MD;  Location: [facility];  Service: Urology;  Laterality: N/A;    Family History  Problem Relation Age of Onset   Rheum arthritis Mother    Diabetes Mother    Stroke Mother    Heart attack Mother    Kidney failure Mother    Heart attack Father    Heart disease Maternal Grandmother    Rheum arthritis Maternal Grandmother    Diabetes Maternal Grandmother    Stroke Maternal Grandmother    Colon cancer Neg Hx     Social History:  reports that he quit smoking about 39 years ago. His smoking use included cigarettes. He has a 5.00 pack-year smoking history. He has never used smokeless  tobacco. He reports that he does not drink alcohol and does not use drugs.  Allergies:  Allergies  Allergen Reactions   Aricept [Donepezil]     dizziness   Penicillins Hives   Viagra [Sildenafil]     dizziness    Medications: I have reviewed the patient's current medications.  Results for orders placed or performed during the hospital encounter of 04/01/21 (from the past 48 hour(s))  CBC     Status: Abnormal   Collection Time: 04/03/21  1:02 AM  Result Value Ref Range   WBC 5.5 4.0 - 10.5 K/uL   RBC 4.26 4.22 - 5.81 MIL/uL   Hemoglobin 13.3 13.0 - 17.0 g/dL   HCT 38.8 (L) 39.0 - 52.0 %   MCV 91.1 80.0 - 100.0 fL   MCH 31.2 26.0 - 34.0 pg   MCHC 34.3 30.0 - 36.0 g/dL   RDW 13.6 11.5 - 15.5 %   Platelets 108 (L) 150 -  400 K/uL    Comment: Immature Platelet Fraction may be clinically indicated, consider ordering this additional test [id number]    nRBC 0.0 0.0 - 0.2 %    Comment: Performed at [facility], [place] [address], [place], [place] [address]  Comprehensive metabolic panel     Status: Abnormal   Collection Time: 04/03/21  1:02 AM  Result Value Ref Range   Sodium 133 (L) 135 - 145 mmol/L   Potassium 3.7 3.5 - 5.1 mmol/L   Chloride 102 98 - 111 mmol/L   CO2 23 22 - 32 mmol/L   Glucose, Bld 117 (H) 70 - 99 mg/dL    Comment: Glucose reference range applies only to samples taken after fasting for at least 8 hours.   BUN 20 8 - 23 mg/dL   Creatinine, Ser 1.00 0.61 - 1.24 mg/dL   Calcium 8.5 (L) 8.9 - 10.3 mg/dL   Total Protein 5.9 (L) 6.5 - 8.1 g/dL   Albumin 2.6 (L) 3.5 - 5.0 g/dL   AST 28 15 - 41 U/L   ALT 22 0 - 44 U/L   Alkaline Phosphatase 54 38 - 126 U/L   Total Bilirubin 1.1 0.3 - 1.2 mg/dL   GFR, Estimated >60 >60 mL/min    Comment: (NOTE) Calculated using the CKD-EPI Creatinine Equation (2021)    Anion gap 8 5 - 15    Comment: Performed at [facility], [place] [address], [place], [place] [address]  CBC with Differential/Platelet     Status: Abnormal   Collection Time: 04/04/21  3:26 AM  Result Value Ref Range   WBC 4.6 4.0 - 10.5 K/uL   RBC 4.34 4.22 - 5.81 MIL/uL   Hemoglobin 13.6 13.0 - 17.0 g/dL   HCT 39.4 39.0 - 52.0 %   MCV 90.8 80.0 - 100.0 fL   MCH 31.3 26.0 - 34.0 pg   MCHC 34.5 30.0 - 36.0 g/dL   RDW 13.3 11.5 - 15.5 %   Platelets 109 (L) 150 - 400 K/uL    Comment: Immature Platelet Fraction may be clinically indicated, consider ordering this additional test [id number] REPEATED TO VERIFY PLATELET COUNT CONFIRMED BY SMEAR    nRBC 0.0 0.0 - 0.2 %   Neutrophils Relative % 76 %   Neutro Abs 3.5 1.7 - 7.7 K/uL   Lymphocytes Relative 14 %   Lymphs Abs 0.6 (L) 0.7 - 4.0 K/uL   Monocytes Relative 8 %   Monocytes Absolute 0.4 0.1 - 1.0 K/uL   Eosinophils  Relative 2 %   Eosinophils Absolute 0.1 0.0 - 0.5 K/uL   Basophils Relative 0 %   Basophils Absolute 0.0 0.0 - 0.1 K/uL   Immature Granulocytes 0 %   Abs Immature Granulocytes 0.01 0.00 - 0.07 K/uL    Comment: Performed at [facility] [address], [place], [place] [address]  Basic metabolic panel     Status: Abnormal   Collection Time: 04/04/21  3:26 AM  Result Value Ref Range   Sodium 136 135 - 145 mmol/L   Potassium 3.6 3.5 - 5.1 mmol/L   Chloride 104 98 - 111 mmol/L   CO2 26 22 - 32 mmol/L   Glucose, Bld 99 70 - 99 mg/dL    Comment: Glucose reference range applies only to samples taken after fasting for at least 8 hours.   BUN 14 8 - 23 mg/dL   Creatinine, Ser 0.93 0.61 - 1.24 mg/dL   Calcium 8.8 (L) 8.9 - 10.3 mg/dL   GFR, Estimated >60 >60 mL/min    Comment: (NOTE) Calculated using the CKD-EPI Creatinine Equation (2021)    Anion gap 6 5 - 15    Comment: Performed at [facility] [address] [place], [place] [address]  Uric acid     Status: None   Collection Time: 04/04/21  3:26 AM  Result Value Ref Range   Uric Acid, Serum 5.6 3.7 - 8.6 mg/dL    Comment: Performed at [facility] [address] [place], [place] [address]    DG Knee 1-2 Views Right  Result Date: 04/04/2021 CLINICAL DATA:  Knee pain. EXAM: RIGHT KNEE - 1-2 VIEW COMPARISON:  None. FINDINGS: Age related tricompartmental degenerative changes, most significant in the lateral compartment with areas significant joint space narrowing and mild osteophytic spurring. No acute bony abnormality, osteochondral lesion or chondrocalcinosis. Suspect small joint effusion. IMPRESSION: 1. Age related tricompartmental degenerative changes, most significant in the lateral compartment. 2. Suspect small joint effusion. Electronically Signed   By: Marijo Sanes M.D.   On: 04/04/2021 13:00    Review of Systems  HENT:  Negative for ear discharge, ear pain, hearing loss and tinnitus.   Eyes:  Negative for  photophobia and pain.  Respiratory:  Negative for cough and shortness of breath.   Cardiovascular:  Negative for chest pain.  Gastrointestinal:  Negative for abdominal pain, nausea and vomiting.  Genitourinary:  Negative for dysuria, flank pain, frequency and urgency.  Musculoskeletal:  Positive for arthralgias (Right knee). Negative for back pain, myalgias and neck pain.  Neurological:  Negative for dizziness and headaches.  Hematological:  Does not bruise/bleed easily.  Psychiatric/Behavioral:  The patient is not nervous/anxious.   Blood pressure 122/75, pulse 67, temperature 98.6 F (37 C), temperature source Oral, resp. rate 17, SpO2 96 %. Physical Exam Constitutional:      General: He is not in acute distress.    Appearance: He is well-developed. He is not diaphoretic.  HENT:     Head: Normocephalic and atraumatic.  Eyes:     General: No scleral icterus.       Right eye: No discharge.        Left eye: No discharge.     Conjunctiva/sclera: Conjunctivae normal.  Cardiovascular:     Rate and Rhythm: Normal rate and regular rhythm.  Pulmonary:     Effort: Pulmonary effort is normal. No respiratory distress.  Musculoskeletal:     Cervical back: Normal range of motion.  Comments: RLE No traumatic wounds, ecchymosis, or rash  Exquisite TTP diffusely to light touch  Mild knee effusion  Sens DPN, SPN, TN intact  Motor EHL, ext, flex, evers 5/5  DP 2+, PT 1+, No significant edema  Skin:    General: Skin is warm and dry.  Neurological:     Mental Status: He is alert.  Psychiatric:        Mood and Affect: Mood normal.        Behavior: Behavior normal.    Assessment/Plan: Right knee pain -- Will plan arthrocentesis and injection, either this afternoon or in am.    Lisette Abu, PA-C Orthopedic Surgery [phone] 04/04/2021, 2:17 PM

## 2021-04-05 DIAGNOSIS — D696 Thrombocytopenia, unspecified: Secondary | ICD-10-CM

## 2021-04-05 DIAGNOSIS — R569 Unspecified convulsions: Secondary | ICD-10-CM

## 2021-04-05 DIAGNOSIS — N138 Other obstructive and reflux uropathy: Secondary | ICD-10-CM

## 2021-04-05 DIAGNOSIS — N401 Enlarged prostate with lower urinary tract symptoms: Secondary | ICD-10-CM

## 2021-04-05 DIAGNOSIS — R509 Fever, unspecified: Secondary | ICD-10-CM

## 2021-04-05 LAB — SYNOVIAL CELL COUNT + DIFF, W/ CRYSTALS
Monocyte-Macrophage-Synovial Fluid: 5 % — ABNORMAL LOW (ref 50–90)
Neutrophil, Synovial: 95 % — ABNORMAL HIGH (ref 0–25)
WBC, Synovial: 5725 /mm3 — ABNORMAL HIGH (ref 0–200)

## 2021-04-05 LAB — CULTURE, BLOOD (ROUTINE X 2)
Culture: NO GROWTH
Culture: NO GROWTH

## 2021-04-05 LAB — BASIC METABOLIC PANEL
Anion gap: 5 (ref 5–15)
BUN: 12 mg/dL (ref 8–23)
CO2: 26 mmol/L (ref 22–32)
Calcium: 9.1 mg/dL (ref 8.9–10.3)
Chloride: 104 mmol/L (ref 98–111)
Creatinine, Ser: 0.81 mg/dL (ref 0.61–1.24)
GFR, Estimated: >60 mL/min (ref >60)
Glucose, Bld: 96 mg/dL (ref 70–99)
Potassium: 3.9 mmol/L (ref 3.5–5.1)
Sodium: 135 mmol/L (ref 135–145)

## 2021-04-05 LAB — MAGNESIUM: Magnesium: 2 mg/dL (ref 1.7–2.4)

## 2021-04-05 MED ORDER — ALLOPURINOL 100 MG PO TABS: 100.0000 mg | ORAL_TABLET | Freq: Every day | ORAL | 2 refills | Status: DC

## 2021-04-05 MED ORDER — AMLODIPINE BESYLATE 5 MG PO TABS: 2.5000 mg | ORAL_TABLET | Freq: Two times a day (BID) | ORAL | Status: DC

## 2021-04-05 MED ORDER — ATORVASTATIN CALCIUM 40 MG PO TABS: 20.0000 mg | ORAL_TABLET | Freq: Every day | ORAL | Status: DC

## 2021-04-05 MED ORDER — BUPIVACAINE HCL (PF) 0.5 % IJ SOLN
10.0000 mL | Freq: Once | INTRAMUSCULAR | Status: DC
  Filled 2021-04-05 (×2): qty 10

## 2021-04-05 NOTE — Progress Notes (Signed)
Pt is active with [facility]; Dr Theda Sers [facility] [phone] ext [phone]

## 2021-04-05 NOTE — Plan of Care (Signed)
  Problem: Education: Goal: Knowledge of disease or condition will improve Outcome: Adequate for Discharge Goal: Knowledge of secondary prevention will improve (SELECT ALL) Outcome: Adequate for Discharge Goal: Knowledge of patient specific risk factors will improve (INDIVIDUALIZE FOR PATIENT) Outcome: Adequate for Discharge Goal: Individualized Educational Video(s) Outcome: Adequate for Discharge   Problem: Coping: Goal: Will verbalize positive feelings about self Outcome: Adequate for Discharge Goal: Will identify appropriate support needs Outcome: Adequate for Discharge   Problem: Self-Care: Goal: Ability to participate in self-care as condition permits will improve Outcome: Adequate for Discharge Goal: Verbalization of feelings and concerns over difficulty with self-care will improve Outcome: Adequate for Discharge Goal: Ability to communicate needs accurately will improve Outcome: Adequate for Discharge   Problem: Nutrition: Goal: Risk of aspiration will decrease Outcome: Adequate for Discharge   Problem: Intracerebral Hemorrhage Tissue Perfusion: Goal: Complications of Intracerebral Hemorrhage will be minimized Outcome: Adequate for Discharge   Problem: Education: Goal: Knowledge of General Education information will improve Description: Including pain rating scale, medication(s)/side effects and non-pharmacologic comfort measures Outcome: Adequate for Discharge   Problem: Health Behavior/Discharge Planning: Goal: Ability to manage health-related needs will improve Outcome: Adequate for Discharge   Problem: Clinical Measurements: Goal: Ability to maintain clinical measurements within normal limits will improve Outcome: Adequate for Discharge Goal: Will remain free from infection Outcome: Adequate for Discharge Goal: Diagnostic test results will improve Outcome: Adequate for Discharge Goal: Respiratory complications will improve Outcome: Adequate for  Discharge Goal: Cardiovascular complication will be avoided Outcome: Adequate for Discharge   Problem: Activity: Goal: Risk for activity intolerance will decrease Outcome: Adequate for Discharge   Problem: Nutrition: Goal: Adequate nutrition will be maintained Outcome: Adequate for Discharge   Problem: Coping: Goal: Level of anxiety will decrease Outcome: Adequate for Discharge   Problem: Elimination: Goal: Will not experience complications related to bowel motility Outcome: Adequate for Discharge Goal: Will not experience complications related to urinary retention Outcome: Adequate for Discharge   Problem: Pain Managment: Goal: General experience of comfort will improve Outcome: Adequate for Discharge   Problem: Safety: Goal: Ability to remain free from injury will improve Outcome: Adequate for Discharge   Problem: Skin Integrity: Goal: Risk for impaired skin integrity will decrease Outcome: Adequate for Discharge

## 2021-04-05 NOTE — Procedures (Signed)
Procedure: Right knee aspiration and injection   Indication: Right knee effusion(s)   Surgeon: Silvestre Gunner, PA-C   Assist: None   Anesthesia: Topical refrigerant   EBL: None   Complications: None   Findings: After risks/benefits explained patient desires to undergo procedure. Consent obtained and time out performed. The right knee was sterilely prepped and aspirated. 50ml clear yellow fluid obtained. 27ml 0.5% Marcaine and 40mg  depomedrol instilled. Pt tolerated the procedure well.       Lisette Abu, PA-C Orthopedic Surgery [phone]

## 2021-04-05 NOTE — TOC Transition Note (Signed)
Transition of Care [facility]) - CM/SW Discharge Note   Patient Details  Name: TILMON WISEHART MRN: [id number] Date of Birth: 1940/08/31  [facility]) CM/SW Contact:  Geralynn Ochs, LCSW Phone Number: 04/05/2021, 2:34 PM   Clinical Narrative:   CSW spoke with spouse about patient discharge, family will provide transport. Plan to arrive to hospital to pick the patient up around 5:00 PM. Patient setup with [facility], PT, OT, SLP, and aide. CSW alerted [facility] about patient discharge, they will reach out to the patient to schedule. Wife asking MD to call with discharge instructions. No other TOC needs at this time.    Final next level of care: Home w Home Health Services Barriers to Discharge: Barriers Resolved   Patient Goals and CMS Choice   CMS Medicare.gov Compare Post Acute Care list provided to:: Patient Represenative (must comment) Choice offered to / list presented to : Spouse  Discharge Placement                Patient to be transferred to facility by: Family car Name of family member notified: [name] Patient and family notified of of transfer: 04/05/21  Discharge Plan and Services     Post Acute Care Choice:  (TBD)                               Social Determinants of Health (SDOH) Interventions     Readmission Risk Interventions No flowsheet data found.

## 2021-04-05 NOTE — Progress Notes (Addendum)
Physical Therapy Treatment Patient Details Name: Dustin Hamilton MRN: [id number] DOB: [birth year] Today's Date: 04/05/2021   History of Present Illness 80 y.o. male presenting by EMS s/p seizure-like epsiode. imaging showed L cerebellar hemmorhage.  PMH is significant for CVA, HTN, pSVT with AVNRT ablation in 11/2018, Mobitz Type 1 AV block, dizziness.    PT Comments    Pt received in supine sleeping but easily awoken and agreeable to therapy session. His family member [name] present and supportive throughout. Pt received aspiration and injection in R knee this morning and stated pain is only mild at time of session, needing up to min guard assist for functional mobility tasks. Emphasis on rocking for momentum method with success for STS, upright posture within RW, benefits of short bouts of mobility frequently throughout day and activity pacing. Continue to recommend HHPT upon DC. Pt continues to benefit from PT services to progress toward functional mobility goals.   Recommendations for follow up therapy are one component of a multi-disciplinary discharge planning process, led by the attending physician.  Recommendations may be updated based on patient status, additional functional criteria and insurance authorization.  Follow Up Recommendations  Home health PT     Assistance Recommended at Discharge Frequent or constant Supervision/Assistance  Equipment Recommendations  None recommended by PT (pt has all needed DME)    Recommendations for Other Services       Precautions / Restrictions Precautions Precautions: Fall Precaution Comments: transient episodes of unresponsiveness Restrictions Weight Bearing Restrictions: No     Mobility  Bed Mobility Overal bed mobility: Needs Assistance Bed Mobility: Supine to Sit;Sit to Supine     Supine to sit: Supervision Sit to supine: Min guard;Supervision (Cues for BLE management)   General bed mobility comments: No physical assist  needed, cues for LE management    Transfers Overall transfer level: Needs assistance Equipment used: Rolling walker (2 wheels) Transfers: Sit to/from Stand Sit to Stand: Min assist to min guard   General transfer comment: Improved power up upon initiation of rocking technique for momentum, cues for safe hand placement and upright trunk.    Ambulation/Gait Ambulation/Gait assistance: Min guard;Min assist Gait Distance (Feet): 45 Feet (5, sittinng break after brushing teeth at sink, 40) Assistive device: Rolling walker (2 wheels) Gait Pattern/deviations: Step-through pattern;Shuffle;Decreased stride length;Trunk flexed Gait velocity: Decreased   General Gait Details: Multimodal cues for walker proximity and upright posture, up to minA for safety    Modified Rankin (Stroke Patients Only) Modified Rankin (Stroke Patients Only) Pre-Morbid Rankin Score: Slight disability Modified Rankin: Moderately severe disability   Balance Overall balance assessment: Needs assistance Sitting-balance support: Feet supported;Bilateral upper extremity supported Sitting balance-Leahy Scale: Good Sitting balance - Comments: Pt able to sit EOB today without posterior lean after initial cue to keep trunk upright   Standing balance support: Bilateral upper extremity supported;During functional activity;Reliant on assistive device for balance Standing balance-Leahy Scale: Poor Standing balance comment: Stood at sink to brush teeth with unilateral UE support, no LOB, verbal cues needed to discourage leaning too far down to sink     Cognition Arousal/Alertness: Awake/alert Behavior During Therapy: Flat affect Overall Cognitive Status: Impaired/Different from baseline Area of Impairment: Attention;Orientation;Memory;Following commands;Safety/judgement;Awareness;Problem solving    Current Attention Level: Sustained Memory: Decreased short-term memory Following Commands: Follows one step commands with  increased time;Follows one step commands inconsistently Safety/Judgement: Decreased awareness of safety;Decreased awareness of deficits Awareness: Emergent Problem Solving: Slow processing    Exercises Other Exercises Other Exercises: LAQ x5  General Comments General comments (skin integrity, edema, etc.): VSS on RA throughout session, no c/o discomfort this session      Pertinent Vitals/Pain Pain Assessment: 0-10 Pain Score: 3  Pain Location: Lateral R knee Pain Descriptors / Indicators: Aching Pain Intervention(s): Monitored during session (Pt had aspiration of R knee this morning, states it hurts but is beginning to feel better)     PT Goals (current goals can now be found in the care plan section) Acute Rehab PT Goals Patient Stated Goal: none stated PT Goal Formulation: With patient Time For Goal Achievement: 04/16/21 Progress towards PT goals: Progressing toward goals   Frequency    Min 3X/week    PT Plan Current plan remains appropriate      AM-PAC PT "6 Clicks" Mobility   Outcome Measure  Help needed turning from your back to your side while in a flat bed without using bedrails?: A Little Help needed moving from lying on your back to sitting on the side of a flat bed without using bedrails?: A Little Help needed moving to and from a bed to a chair (including a wheelchair)?: A Little Help needed standing up from a chair using your arms (e.g., wheelchair or bedside chair)?: A Little Help needed to walk in hospital room?: A Little Help needed climbing 3-5 steps with a railing? : A Lot 6 Click Score: 17    End of Session Equipment Utilized During Treatment: Gait belt Activity Tolerance: Patient tolerated treatment well;Patient limited by fatigue Patient left: with call bell/phone within reach;with family/visitor present;in bed;with bed alarm set (Pts daughter [name] present and supportive throughout) Nurse Communication: Mobility status;Other (comment) PT Visit  Diagnosis: Unsteadiness on feet (R26.81);Muscle weakness (generalized) (M62.81);Difficulty in walking, not elsewhere classified (R26.2)     Time: 1610-9604 PT Time Calculation (min) (ACUTE ONLY): 24 min  Charges:  $Gait Training: 8-22 mins $Therapeutic Activity: 8-22 mins                     Evelene Croon, Student PTA CI: Carly P., PTA  Carly M Poff 04/05/2021, 11:58 AM

## 2021-04-05 NOTE — Discharge Summary (Signed)
Physician Discharge Summary  Dustin Hamilton BJY:[id number] DOB: [birth year] DOA: 04/01/2021  PCP: [facility]  Admit date: 04/01/2021 Discharge date: 04/05/2021  Admitted From: Home  Discharge disposition: Home Health  Recommendations for Outpatient Follow-Up:   Follow up with your primary care provider in one week.  Check CBC, BMP, magnesium in the next visit Follow-up with your neurologist at [facility] on 05/16/21.   Discharge Diagnosis:   Principal Problem:   Nontraumatic cerebral hemorrhage (HCC) Active Problems:   Essential hypertension   Benign prostatic hyperplasia with urinary obstruction   History of stroke   Thrombocytopenia (HCC)   Acute kidney injury ([place])   Fever and chills  Discharge Condition: Improved.  Diet recommendation: Low sodium, heart healthy.    Wound care: None.  Code status: Full.  History of Present Illness:   80 y.o. male with past medical history of vascular dementia, hypertension, history of previous stroke in 2014, 2020, 2022, history of cerebral bleed, alcohol use disorder presented to hospital with recurrent spells of decreased responsiveness and blank stares.  Patient had been started on Keppra for similar spells in 2018.    He was discharged from the ED on 03/31/2021 after an unremarkable work-up.  However, he came back to the ED on 04/01/2021 because of dizziness, generalized weakness and difficulty ambulating.  MRI brain reviewed acute microhemorrhage in the left cerebellum.  Patient was then admitted to the hospital.  Hospital Course:   Following conditions were addressed during hospitalization as listed below,  Left cerebellar microhemorrhage:  CT scan of the head showed left cerebellar stroke confirmed on the MRI with microhemorrhages.  CT angiogram did not show any evidence of a large vessel occlusion.  Hemoglobin A1c was 5.8.  LDL was 40.  2D echocardiogram showed LV ejection fraction of 60 to 65%.   Patient was seen by neurology who did not recommend any antithrombotics due to microhemorrhages.  Physical therapy  recommended skilled nursing facility but family wished home with home health services on discharge.  Patient does have an appointment to follow-up at [facility] neurology scheduled.  He was advised not to take aspirin until that time.    Ambulatory dysfunction with right-sided knee pain Thought to be secondary to acute gouty arthritis.  Seen by orthopedics.  Underwent arthrocentesis with injection of steroid today.  PT saw the patient after injection and recommended home PT discharge  Essential hypertension: Continue metoprolol.   Dyslipidemia: LDL 40, Lipitor decreased to 20 mg daily on discharge.   AKI: Improved at this time.   Thrombocytopenia: Mild.   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  Patient follows up with [facility] neurology.  Disposition.  At this time, patient is stable for disposition home with home health.  Spoke with the patient's wife at bedside regarding disposition.  Medical Consultants:   Neurology Orthopedics  Procedures:    Arthrocentesis of the right knee with injection of steroid on 04/05/2021 by orthopedics Subjective:   Today, patient was seen and examined at bedside.  Complains of mild right knee pain.  Discharge Exam:   Vitals:   04/05/21 0334 04/05/21 0759  BP: 121/73 126/75  Pulse: 70 66  Resp: 16 18  Temp: (!) 97.5 F (36.4 C) 97.8 F (36.6 C)  SpO2: 96% 97%   Vitals:   04/04/21 1958 04/05/21 0003 04/05/21 0334 04/05/21 0759  BP: (!) 153/81 131/76 121/73 126/75  Pulse: 70 70 70 66  Resp: 20 16 16 18   Temp: 98.9 F (37.2 C) (!) 97.3 F (36.3 C) (!) 97.5 F (36.4 C) 97.8 F (36.6 C)  TempSrc: Oral Oral Oral Oral  SpO2: 95% 100% 96% 97%   General: Alert awake, not in obvious distress HENT: pupils  equally reacting to light,  No scleral pallor or icterus noted. Oral mucosa is moist.  Chest:  Clear breath sounds.  Diminished breath sounds bilaterally. No crackles or wheezes.  CVS: S1 &S2 heard. No murmur.  Regular rate and rhythm. Abdomen: Soft, nontender, nondistended.  Bowel sounds are heard.   Extremities: Right knee tender to touch. Psych: Alert, awake and communicative, normal mood CNS:  No cranial nerve deficits.  Power equal in all extremities.   Skin: Warm and dry.  No rashes noted.  The results of significant diagnostics from this hospitalization (including imaging, microbiology, ancillary and laboratory) are listed below for reference.     Diagnostic Studies:   CT ANGIO HEAD NECK W WO CM  Result Date: 04/01/2021 CLINICAL DATA:  Confusion and loss of balance EXAM: CT ANGIOGRAPHY HEAD AND NECK TECHNIQUE: Multidetector CT imaging of the head and neck was performed using the standard protocol during bolus administration of intravenous contrast. Multiplanar CT image reconstructions and MIPs were obtained to evaluate the vascular anatomy. Carotid stenosis measurements (when applicable) are obtained utilizing NASCET criteria, using the distal internal carotid diameter as the denominator. CONTRAST:  42mL OMNIPAQUE IOHEXOL 350 MG/ML SOLN COMPARISON:  04/01/2021 FINDINGS: CTA NECK FINDINGS SKELETON: There is no bony spinal canal stenosis. No lytic or blastic lesion. OTHER NECK: Normal pharynx, larynx and major salivary glands. No cervical lymphadenopathy. Unremarkable thyroid gland. UPPER CHEST: Incompletely visualized small right pleural effusion with area of suspected atelectasis in the right middle lobe. AORTIC ARCH: There is no calcific atherosclerosis of the aortic arch. There is no aneurysm, dissection or hemodynamically significant stenosis of the visualized portion of the aorta. Conventional 3 vessel aortic branching pattern. The visualized proximal subclavian arteries are widely  patent. RIGHT CAROTID SYSTEM: Normal without aneurysm, dissection or stenosis. LEFT CAROTID SYSTEM: Normal without aneurysm, dissection or stenosis. VERTEBRAL ARTERIES: Left dominant configuration. Both origins are clearly patent. There is no dissection, occlusion or flow-limiting stenosis to the skull base (V1-V3 segments). CTA HEAD FINDINGS POSTERIOR CIRCULATION: --Vertebral arteries: Normal V4 segments. --Inferior cerebellar arteries: Normal. --Basilar artery: Normal. --Superior cerebellar arteries: Normal. --Posterior cerebral arteries (PCA): Normal. ANTERIOR CIRCULATION: --Intracranial internal carotid arteries: Normal. --Anterior cerebral arteries (ACA): Normal. Both A1 segments are present. Patent anterior communicating artery (a-comm). --Middle cerebral arteries (MCA): Normal. VENOUS SINUSES: As permitted by contrast timing, patent. ANATOMIC VARIANTS: None Review of the MIP images confirms the above findings. IMPRESSION: 1. No emergent large vessel occlusion or high-grade stenosis of the intracranial or cervical arteries. 2. Incompletely visualized small right pleural effusion with area of suspected atelectasis in the right middle lobe. 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 Electronically Signed   By: Ulyses Jarred M.D.   On: 04/01/2021 23:04   DG Chest 2 View  Result Date: 04/01/2021 CLINICAL DATA:  Diminished lung sounds EXAM: CHEST - 2 VIEW COMPARISON:  Chest x-ray 03/30/2021 FINDINGS: The heart size is normal. Mediastinum appears stable. Left-sided cardiac pacemaker device. Pulmonary vasculature is normal. No focal consolidation identified. Elevated right hemidiaphragm with associated subsegmental atelectasis at the right lung base. No pleural effusion or pneumothorax visualized. IMPRESSION: No acute process identified. Elevated right hemidiaphragm with associated compressive atelectasis at the right lung base. Electronically  Signed   By: Ofilia Neas M.D.   On: 04/01/2021 10:29   CT Head Wo  Contrast  Result Date: 04/01/2021 CLINICAL DATA:  Mental status changes of unknown cause. Weakness and difficulty with word finding. EXAM: CT HEAD WITHOUT CONTRAST TECHNIQUE: Contiguous axial images were obtained from the base of the skull through the vertex without intravenous contrast. COMPARISON:  03/31/2021 FINDINGS: Brain: Small area of low-density in the left cerebellum with a central hyperdensity measuring about 5 mm in size is suspicious for an acute/subacute cerebellar stroke with some internal petechial bleeding. This was not present in January of last year. Cerebral hemispheres show generalized atrophy with extensive chronic small-vessel ischemic changes throughout the white matter, and thalami. No sign of acute supra tentorial stroke. No mass, other hemorrhage, hydrocephalus or extra-axial collection. Vascular: There is atherosclerotic calcification of the major vessels at the base of the brain. Skull: Negative Sinuses/Orbits: Clear/normal Other: None IMPRESSION: Background pattern of generalized atrophy with extensive chronic small-vessel ischemic changes throughout the cerebral hemispheric white matter and thalami. I think there is an acute or subacute left cerebellar infarction with minimal petechial blood products. This does not appear visibly changed since yesterday. These results will be called to the ordering clinician or representative by the Radiologist Assistant, and communication documented in the PACS or [employer]. Electronically Signed   By: Nelson Chimes M.D.   On: 04/01/2021 11:47   MR BRAIN WO CONTRAST  Result Date: 04/01/2021 CLINICAL DATA:  Mental status change of unknown cause. EXAM: MRI HEAD WITHOUT CONTRAST TECHNIQUE: Multiplanar, multiecho pulse sequences of the brain and surrounding structures were obtained without intravenous contrast. COMPARISON:  Head CT earlier same day. FINDINGS: Brain: The cerebellum shows numerous old micro hemorrhagic insults. There is a more  recent hemorrhage within the left cerebellum measuring a few mm in size, corresponding with the CT finding. This could be a hemorrhagic infarction or a primary microhemorrhage. Cerebral hemispheres do not show any acute infarction. There are extensive chronic small-vessel ischemic changes throughout the brain, many associated with micro hemorrhagic hemosiderin deposition. No large vessel territory infarction. No mass lesion, structure hydrocephalus or extra-axial collection. Vascular: Major vessels at the base of the brain show flow. Skull and upper cervical spine: Negative Sinuses/Orbits: Clear/normal Other: None IMPRESSION: Acute microhemorrhage within the left cerebellum, corresponding to the CT finding. This could either represent a hemorrhagic infarction or a primary microhemorrhage. Extensive chronic small-vessel ischemic changes affect the cerebral hemispheres, many of the insults associated with hemosiderin deposition. Chronic white matter volume loss with ventriculomegaly but no suspicion of obstructive hydrocephalus. Electronically Signed   By: Nelson Chimes M.D.   On: 04/01/2021 15:39   EEG adult  Result Date: 04/02/2021 Lora Havens, MD     04/02/2021  8:56 AM Patient Name: Dustin Hamilton MRN: [id number] Epilepsy Attending: Lora Havens Referring Physician/Provider: Anibal Henderson, NP Date: 04/01/2021 Duration: 23.34 mins Patient history: 80 y.o. male with 2 presentations in the past 2 days for evaluation of fevers and weakness impairing his ambulation; also with reports of "spells" where he stares off, is unable to speak, and wants to rest afterward.  EEG to evaluate for seizures. Level of alertness: Awake, asleep AEDs during EEG study: None Technical aspects: This EEG study was done with scalp electrodes positioned according to the 10-20 International system of electrode placement. Electrical activity was acquired at a sampling rate of 500Hz  and reviewed with a high frequency filter of  70Hz  and a low frequency filter of  1Hz . EEG data were recorded continuously and digitally stored. Description: The posterior dominant rhythm consists of 9-10 Hz activity of moderate voltage (25-35 uV) seen predominantly in posterior head regions, symmetric and reactive to eye opening and eye closing. Sleep was characterized by vertex waves, sleep spindles (12 to 14 Hz), maximal frontocentral region.  Hyperventilation and photic stimulation were not performed.   IMPRESSION: This study is within normal limits. No seizures or epileptiform discharges were seen throughout the recording. Lora Havens   ECHOCARDIOGRAM COMPLETE  Result Date: 04/02/2021    ECHOCARDIOGRAM REPORT   Patient Name:   Dustin Hamilton Date of Exam: 04/02/2021 Medical Rec #:  [id number]          Height:       71.0 in Accession #:    [id number]         Weight:       163.4 lb Date of Birth:  06-Jun-1940          BSA:          1.935 m Patient Age:    80 years           BP:           106/73 mmHg Patient Gender: M                  HR:           84 bpm. Exam Location:  Inpatient Procedure: 2D Echo, Cardiac Doppler, Color Doppler and Intracardiac            Opacification Agent Indications:    Stroke  History:        Patient has prior history of Echocardiogram examinations, most                 recent 05/23/2020. Pacemaker, Stroke, Arrythmias:Tachycardia;                 Risk Factors:Hypertension.  Sonographer:    Glo Herring Referring Phys: [id number] ALLISON WOLFE IMPRESSIONS  1. VIgorous LV function with near mid cavity obliteration. . Left ventricular ejection fraction, by estimation, is 60 to 65%. The left ventricle has normal function. The left ventricle has no regional wall motion abnormalities. There is mild left ventricular hypertrophy. Left ventricular diastolic parameters are indeterminate.  2. Right ventricular systolic function is normal. The right ventricular size is normal.  3. The mitral valve is normal in structure. Trivial mitral  valve regurgitation.  4. The aortic valve is normal in structure. Aortic valve regurgitation is not visualized. No aortic stenosis is present.  5. The inferior vena cava is dilated in size with >50% respiratory variability, suggesting right atrial pressure of 8 mmHg. Comparison(s): The left ventricular function is unchanged. FINDINGS  Left Ventricle: VIgorous LV function with near mid cavity obliteration. Left ventricular ejection fraction, by estimation, is 60 to 65%. The left ventricle has normal function. The left ventricle has no regional wall motion abnormalities. The left ventricular internal cavity size was normal in size. There is mild left ventricular hypertrophy. Left ventricular diastolic parameters are indeterminate. Right Ventricle: The right ventricular size is normal. Right vetricular wall thickness was not assessed. Right ventricular systolic function is normal. Left Atrium: Left atrial size was normal in size. Right Atrium: Right atrial size was normal in size. Pericardium: There is no evidence of pericardial effusion. Mitral Valve: The mitral valve is normal in structure. Trivial mitral valve regurgitation. Tricuspid Valve: The tricuspid valve is grossly normal. Tricuspid valve regurgitation is  trivial. Aortic Valve: The aortic valve is normal in structure. Aortic valve regurgitation is not visualized. No aortic stenosis is present. Aortic valve mean gradient measures 3.0 mmHg. Aortic valve peak gradient measures 5.9 mmHg. Pulmonic Valve: The pulmonic valve was not well visualized. Pulmonic valve regurgitation is not visualized. Aorta: The aortic root is normal in size and structure. Venous: The inferior vena cava is dilated in size with greater than 50% respiratory variability, suggesting right atrial pressure of 8 mmHg. IAS/Shunts: No atrial level shunt detected by color flow Doppler.  LEFT VENTRICLE PLAX 2D LVIDd:         3.70 cm Diastology LVIDs:         2.50 cm LV e' medial:  11.60 cm/s LV PW:          1.20 cm LV e' lateral: 8.16 cm/s LV IVS:        1.20 cm  IVC IVC diam: 2.10 cm LEFT ATRIUM         Index LA diam:    3.90 cm 2.02 cm/m  AORTIC VALVE                   PULMONIC VALVE AV Vmax:           121.00 cm/s PV Vmax:       0.82 m/s AV Vmean:          88.800 cm/s PV Peak grad:  2.7 mmHg AV VTI:            0.183 m AV Peak Grad:      5.9 mmHg AV Mean Grad:      3.0 mmHg LVOT Vmax:         97.90 cm/s LVOT Vmean:        72.100 cm/s LVOT VTI:          0.147 m LVOT/AV VTI ratio: 0.80  AORTA Ao Root diam: 2.90 cm  SHUNTS Systemic VTI: 0.15 m Dorris Carnes MD Electronically signed by Dorris Carnes MD Signature Date/Time: 04/02/2021/4:25:55 PM    Final      Labs:   Basic Metabolic Panel: Recent Labs  Lab 04/01/21 1015 04/02/21 0336 04/03/21 0102 04/04/21 0326 04/05/21 0332  NA 136 134* 133* 136 135  K 4.3 3.7 3.7 3.6 3.9  CL 102 105 102 104 104  CO2 25 20* 23 26 26   GLUCOSE 115* 88 117* 99 96  BUN 18 18 20 14 12   CREATININE 1.32* 1.09 1.00 0.93 0.81  CALCIUM 9.4 8.9 8.5* 8.8* 9.1  MG  --  1.8  --   --  2.0   GFR CrCl cannot be calculated (Unknown ideal weight.). Liver Function Tests: Recent Labs  Lab 03/30/21 2235 04/01/21 1015 04/03/21 0102  AST 22 27 28   ALT 21 23 22   ALKPHOS 69 67 54  BILITOT 0.6 1.7* 1.1  PROT 6.7 6.9 5.9*  ALBUMIN 3.3* 3.3* 2.6*   No results for input(s): LIPASE, AMYLASE in the last 168 hours. No results for input(s): AMMONIA in the last 168 hours. Coagulation profile Recent Labs  Lab 03/30/21 2235  INR 1.0    CBC: Recent Labs  Lab 03/30/21 2235 04/01/21 1015 04/02/21 0336 04/03/21 0102 04/04/21 0326  WBC 5.1 6.3 6.2 5.5 4.6  NEUTROABS 4.0 4.9  --   --  3.5  HGB 16.0 15.7 14.8 13.3 13.6  HCT 46.0 46.6 43.4 38.8* 39.4  MCV 91.8 93.2 91.6 91.1 90.8  PLT 115* 111* 106* 108* 109*   Cardiac  Enzymes: No results for input(s): CKTOTAL, CKMB, CKMBINDEX, TROPONINI in the last 168 hours. BNP: Invalid input(s): POCBNP CBG: Recent Labs  Lab  03/30/21 2217 04/01/21 1014  GLUCAP 169* 120*   D-Dimer No results for input(s): DDIMER in the last 72 hours. Hgb A1c No results for input(s): HGBA1C in the last 72 hours. Lipid Profile No results for input(s): CHOL, HDL, LDLCALC, TRIG, CHOLHDL, LDLDIRECT in the last 72 hours. Thyroid function studies No results for input(s): TSH, T4TOTAL, T3FREE, THYROIDAB in the last 72 hours.  Invalid input(s): FREET3 Anemia work up No results for input(s): VITAMINB12, FOLATE, FERRITIN, TIBC, IRON, RETICCTPCT in the last 72 hours. Microbiology Recent Results (from the past 240 hour(s))  Urine Culture     Status: Abnormal   Collection Time: 03/30/21 10:35 PM   Specimen: In/Out Cath Urine  Result Value Ref Range Status   Specimen Description IN/OUT CATH URINE  Final   Special Requests   Final    NONE Performed at [facility], [address], [place], [place] [address]    Culture MULTIPLE SPECIES PRESENT, SUGGEST RECOLLECTION (A)  Final   Report Status 04/01/2021 FINAL  Final  Blood culture (routine x 2)     Status: None   Collection Time: 03/30/21 10:50 PM   Specimen: BLOOD RIGHT HAND  Result Value Ref Range Status   Specimen Description BLOOD RIGHT HAND  Final   Special Requests   Final    BOTTLES DRAWN AEROBIC AND ANAEROBIC Blood Culture results may not be optimal due to an inadequate volume of blood received in culture bottles   Culture   Final    NO GROWTH 5 DAYS Performed at [facility], [place] [address], [place], [place] [address]    Report Status 04/05/2021 FINAL  Final  Resp Panel by RT-PCR (Flu A&B, Covid)     Status: None   Collection Time: 03/31/21 12:16 AM   Specimen: Nasopharyngeal(NP) swabs in vial transport medium  Result Value Ref Range Status   SARS Coronavirus 2 by RT PCR NEGATIVE NEGATIVE Final    Comment: (NOTE) SARS-CoV-2 target nucleic acids are NOT DETECTED.  The SARS-CoV-2 RNA is generally detectable in upper respiratory specimens during the  acute phase of infection. The lowest concentration of SARS-CoV-2 viral copies this assay can detect is 138 copies/mL. A negative result does not preclude SARS-Cov-2 infection and should not be used as the sole basis for treatment or other patient management decisions. A negative result may occur with  improper specimen collection/handling, submission of specimen other than nasopharyngeal swab, presence of viral mutation(s) within the areas targeted by this assay, and inadequate number of viral copies(<138 copies/mL). A negative result must be combined with clinical observations, patient history, and epidemiological information. The expected result is Negative.  Fact Sheet for Patients:  EntrepreneurPulse.com.au  Fact Sheet for Healthcare Providers:  IncredibleEmployment.be  This test is no t yet approved or cleared by the [place] FDA and  has been authorized for detection and/or diagnosis of SARS-CoV-2 by FDA under an Emergency Use Authorization (EUA). This EUA will remain  in effect (meaning this test can be used) for the duration of the COVID-19 declaration under Section 564(b)(1) of the Act, 21 U.S.C.section 360bbb-3(b)(1), unless the authorization is terminated  or revoked sooner.       Influenza A by PCR NEGATIVE NEGATIVE Final   Influenza B by PCR NEGATIVE NEGATIVE Final    Comment: (NOTE) The Xpert Xpress SARS-CoV-2/FLU/RSV plus assay is intended  as an aid in the diagnosis of influenza from Nasopharyngeal swab specimens and should not be used as a sole basis for treatment. Nasal washings and aspirates are unacceptable for Xpert Xpress SARS-CoV-2/FLU/RSV testing.  Fact Sheet for Patients: EntrepreneurPulse.com.au  Fact Sheet for Healthcare Providers: IncredibleEmployment.be  This test is not yet approved or cleared by the [place] FDA and has been authorized for detection and/or  diagnosis of SARS-CoV-2 by FDA under an Emergency Use Authorization (EUA). This EUA will remain in effect (meaning this test can be used) for the duration of the COVID-19 declaration under Section 564(b)(1) of the Act, 21 U.S.C. section 360bbb-3(b)(1), unless the authorization is terminated or revoked.  Performed at [facility], [place] [address] [place], [place] [address]   Blood culture (routine x 2)     Status: None   Collection Time: 03/31/21 12:23 AM   Specimen: BLOOD  Result Value Ref Range Status   Specimen Description BLOOD SITE NOT SPECIFIED  Final   Special Requests   Final    BOTTLES DRAWN AEROBIC AND ANAEROBIC Blood Culture results may not be optimal due to an inadequate volume of blood received in culture bottles   Culture   Final    NO GROWTH 5 DAYS Performed at [facility], [place] [address] [place], [place] [address]    Report Status 04/05/2021 FINAL  Final  Resp Panel by RT-PCR (Flu A&B, Covid) Nasopharyngeal Swab     Status: None   Collection Time: 04/01/21 12:42 PM   Specimen: Nasopharyngeal Swab; Nasopharyngeal(NP) swabs in vial transport medium  Result Value Ref Range Status   SARS Coronavirus 2 by RT PCR NEGATIVE NEGATIVE Final    Comment: (NOTE) SARS-CoV-2 target nucleic acids are NOT DETECTED.  The SARS-CoV-2 RNA is generally detectable in upper respiratory specimens during the acute phase of infection. The lowest concentration of SARS-CoV-2 viral copies this assay can detect is 138 copies/mL. A negative result does not preclude SARS-Cov-2 infection and should not be used as the sole basis for treatment or other patient management decisions. A negative result may occur with  improper specimen collection/handling, submission of specimen other than nasopharyngeal swab, presence of viral mutation(s) within the areas targeted by this assay, and inadequate number of viral copies(<138 copies/mL). A negative result must be combined with clinical  observations, patient history, and epidemiological information. The expected result is Negative.  Fact Sheet for Patients:  EntrepreneurPulse.com.au  Fact Sheet for Healthcare Providers:  IncredibleEmployment.be  This test is no t yet approved or cleared by the [place] FDA and  has been authorized for detection and/or diagnosis of SARS-CoV-2 by FDA under an Emergency Use Authorization (EUA). This EUA will remain  in effect (meaning this test can be used) for the duration of the COVID-19 declaration under Section 564(b)(1) of the Act, 21 U.S.C.section 360bbb-3(b)(1), unless the authorization is terminated  or revoked sooner.       Influenza A by PCR NEGATIVE NEGATIVE Final   Influenza B by PCR NEGATIVE NEGATIVE Final    Comment: (NOTE) The Xpert Xpress SARS-CoV-2/FLU/RSV plus assay is intended as an aid in the diagnosis of influenza from Nasopharyngeal swab specimens and should not be used as a sole basis for treatment. Nasal washings and aspirates are unacceptable for Xpert Xpress SARS-CoV-2/FLU/RSV testing.  Fact Sheet for Patients: EntrepreneurPulse.com.au  Fact Sheet for Healthcare Providers: IncredibleEmployment.be  This test is not yet approved or cleared by the [place] FDA and has been authorized for detection  and/or diagnosis of SARS-CoV-2 by FDA under an Emergency Use Authorization (EUA). This EUA will remain in effect (meaning this test can be used) for the duration of the COVID-19 declaration under Section 564(b)(1) of the Act, 21 U.S.C. section 360bbb-3(b)(1), unless the authorization is terminated or revoked.  Performed at [facility], [place] [address] [place], [place] [address]   Urine Culture     Status: Abnormal   Collection Time: 04/01/21  1:12 PM   Specimen: Urine, Clean Catch  Result Value Ref Range Status   Specimen Description URINE, CLEAN CATCH  Final    Special Requests   Final    NONE Performed at [facility], [place] [address] [place], [place] [address]    Culture MULTIPLE SPECIES PRESENT, SUGGEST RECOLLECTION (A)  Final   Report Status 04/02/2021 FINAL  Final  Culture, blood (routine x 2)     Status: None (Preliminary result)   Collection Time: 04/01/21  3:54 PM   Specimen: BLOOD  Result Value Ref Range Status   Specimen Description BLOOD BLOOD RIGHT FOREARM  Final   Special Requests   Final    BOTTLES DRAWN AEROBIC AND ANAEROBIC Blood Culture adequate volume   Culture   Final    NO GROWTH 4 DAYS Performed at [facility], [place] [address], [place], [place] [address]    Report Status PENDING  Incomplete  Culture, blood (routine x 2)     Status: None (Preliminary result)   Collection Time: 04/01/21  3:59 PM   Specimen: BLOOD  Result Value Ref Range Status   Specimen Description BLOOD LEFT WRIST  Final   Special Requests   Final    BOTTLES DRAWN AEROBIC AND ANAEROBIC Blood Culture results may not be optimal due to an inadequate volume of blood received in culture bottles   Culture   Final    NO GROWTH 4 DAYS Performed at [facility], [place] [address], [place], [place] [address]    Report Status PENDING  Incomplete  Respiratory (~20 pathogens) panel by PCR     Status: None   Collection Time: 04/01/21 10:29 PM   Specimen: Nasopharyngeal Swab; Respiratory  Result Value Ref Range Status   Adenovirus NOT DETECTED NOT DETECTED Final   Coronavirus 229E NOT DETECTED NOT DETECTED Final    Comment: (NOTE) The Coronavirus on the Respiratory Panel, DOES NOT test for the novel  Coronavirus (2019 nCoV)    Coronavirus HKU1 NOT DETECTED NOT DETECTED Final   Coronavirus NL63 NOT DETECTED NOT DETECTED Final   Coronavirus OC43 NOT DETECTED NOT DETECTED Final   Metapneumovirus NOT DETECTED NOT DETECTED Final   Rhinovirus / Enterovirus NOT DETECTED NOT DETECTED Final   Influenza A NOT DETECTED NOT DETECTED Final   Influenza B  NOT DETECTED NOT DETECTED Final   Parainfluenza Virus 1 NOT DETECTED NOT DETECTED Final   Parainfluenza Virus 2 NOT DETECTED NOT DETECTED Final   Parainfluenza Virus 3 NOT DETECTED NOT DETECTED Final   Parainfluenza Virus 4 NOT DETECTED NOT DETECTED Final   Respiratory Syncytial Virus NOT DETECTED NOT DETECTED Final   Bordetella pertussis NOT DETECTED NOT DETECTED Final   Bordetella Parapertussis NOT DETECTED NOT DETECTED Final   Chlamydophila pneumoniae NOT DETECTED NOT DETECTED Final   Mycoplasma pneumoniae NOT DETECTED NOT DETECTED Final    Comment: Performed at [facility], [place]. [address] [place], [place] [address]     Discharge Instructions:   Discharge Instructions  Diet - low sodium heart healthy   Complete by: As directed    Discharge instructions   Complete by: As directed    [place]  [place], [place] [address]   Appointments [phone] Office [phone]   Discharge instructions   Complete by: As directed    Follow-up with your primary care provider in 1 week.  Do not take aspirin until seen by your neurologist at [facility].  Continue physical therapy at home   Increase activity slowly   Complete by: As directed       Allergies as of 04/05/2021       Reactions   Aricept [donepezil]    dizziness   Penicillins Hives   Viagra [sildenafil]    dizziness        Medication List     STOP taking these medications    aspirin 81 MG EC tablet   levETIRAcetam 250 MG tablet Commonly known as: KEPPRA       TAKE these medications    allopurinol 100 MG tablet Commonly known as: Zyloprim Take 1 tablet (100 mg total) by mouth daily.   amLODipine 5 MG tablet Commonly known as: NORVASC Take 0.5 tablets (2.5 mg total) by mouth in the morning and at bedtime. What changed: medication strength   atorvastatin 40 MG tablet Commonly known as: LIPITOR Take 0.5 tablets (20 mg total) by mouth daily. What changed: how much to  take   brimonidine 0.2 % ophthalmic solution Commonly known as: ALPHAGAN Place 1 drop into both eyes 2 (two) times daily.   CLEAR EYES OP Apply 2 drops to eye daily as needed (dry eyes).   dutasteride 0.5 MG capsule Commonly known as: AVODART Take 0.5 mg by mouth daily.   ENSURE ACTIVE HEART HEALTH PO Take 1 Bottle by mouth as needed (appetite, nutrition).   latanoprost 0.005 % ophthalmic solution Commonly known as: XALATAN Place 1 drop into both eyes at bedtime.   memantine 10 MG tablet Commonly known as: NAMENDA Take 20 mg by mouth at bedtime.   metoprolol succinate 25 MG 24 hr tablet Commonly known as: TOPROL-XL Take 0.5 tablets (12.5 mg total) by mouth at bedtime.   tamsulosin 0.4 MG Caps capsule Commonly known as: FLOMAX Take 0.4 mg by mouth at bedtime.                Follow-up [facility] neurology. Go on 05/16/2021.   Why: As scheduled                 Time coordinating discharge: 39 minutes  Signed:  Nehemyah Foushee  [facility] 04/05/2021, 1:45 PM

## 2021-04-05 NOTE — Progress Notes (Signed)
Occupational Therapy Treatment Patient Details Name: Dustin Hamilton MRN: [id number] DOB: 1940/08/29 Today's Date: 04/05/2021   History of present illness 80 y.o. male presenting by EMS s/p seizure-like epsiode. imaging showed L cerebellar hemmorhage.  PMH is significant for CVA, HTN, pSVT with AVNRT ablation in 11/2018, Mobitz Type 1 AV block, dizziness.   OT comments  Pt is able complete ADLs with min A this date.  He is making excellent progress toward goals.  Education completed with his wife who reports she feel comfortable with taking him home and states they have adequate supports to assist at discharge.    Recommendations for follow up therapy are one component of a multi-disciplinary discharge planning process, led by the attending physician.  Recommendations may be updated based on patient status, additional functional criteria and insurance authorization.    Follow Up Recommendations  Home health OT    Assistance Recommended at Discharge Frequent or constant Supervision/Assistance  Equipment Recommendations  None recommended by OT    Recommendations for Other Services      Precautions / Restrictions Precautions Precautions: Fall Precaution Comments: transient episodes of unresponsiveness Restrictions Weight Bearing Restrictions: No       Mobility Bed Mobility Overal bed mobility: Needs Assistance Bed Mobility: Supine to Sit;Sit to Supine     Supine to sit: Supervision Sit to supine: Supervision   General bed mobility comments: No physical assist needed, cues for LE management    Transfers Overall transfer level: Needs assistance Equipment used: Rolling walker (2 wheels) Transfers: Sit to/from Stand;Stand Pivot Transfers Sit to Stand: Min guard Stand pivot transfers: Min guard         General transfer comment: Improved power up upon initiation of rocking technique for momentum, cues for safe hand placement and upright trunk.     Balance Overall  balance assessment: Needs assistance Sitting-balance support: Feet supported;Bilateral upper extremity supported Sitting balance-Leahy Scale: Good Sitting balance - Comments: Pt able to sit EOB today without posterior lean after initial cue to keep trunk upright   Standing balance support: Bilateral upper extremity supported;During functional activity;Reliant on assistive device for balance Standing balance-Leahy Scale: Poor Standing balance comment: Stood at sink to brush teeth with unilateral UE support, no LOB, verbal cues needed to discourage leaning too far down to sink                           ADL either performed or assessed with clinical judgement   ADL Overall ADL's : Needs assistance/impaired             Lower Body Bathing: Minimal assistance;Sit to/from stand       Lower Body Dressing: Minimal assistance;Sit to/from stand   Toilet Transfer: Min guard;Ambulation;Comfort height toilet;Rolling walker (2 wheels);Grab bars           Functional mobility during ADLs: Min guard;Rolling walker (2 wheels)       Vision   Vision Assessment?: Vision impaired- to be further tested in functional context   Perception     Praxis      Cognition Arousal/Alertness: Awake/alert Behavior During Therapy: WFL for tasks assessed/performed Overall Cognitive Status: Impaired/Different from baseline Area of Impairment: Attention;Orientation;Memory;Following commands;Safety/judgement;Awareness;Problem solving                   Current Attention Level: Sustained Memory: Decreased short-term memory Following Commands: Follows one step commands with increased time;Follows one step commands inconsistently Safety/Judgement: Decreased awareness of safety;Decreased awareness of deficits  Awareness: Emergent Problem Solving: Slow processing;Requires tactile cues;Requires verbal cues            Exercises Exercises: Other exercises Other Exercises Other Exercises:  LAQ x5   Shoulder Instructions       General Comments Encouraged wife to allow pt to perform ADLs as independently as possible and not do too much for him.  We also discussed encouaging him to engage in activities that he enjoys to keep him active and occupied (with appropriate supervision/assist).    Pertinent Vitals/ Pain       Pain Assessment: Faces Pain Score: 3  Faces Pain Scale: Hurts a little bit Pain Location: Lateral R knee Pain Descriptors / Indicators: Aching Pain Intervention(s): Monitored during session  Home Living                                          Prior Functioning/Environment              Frequency  Min 2X/week        Progress Toward Goals  OT Goals(current goals can now be found in the care plan section)  Progress towards OT goals: Progressing toward goals     Plan Discharge plan remains appropriate    Co-evaluation                 AM-PAC OT "6 Clicks" Daily Activity     Outcome Measure   Help from another person eating meals?: A Little Help from another person taking care of personal grooming?: A Little Help from another person toileting, which includes using toliet, bedpan, or urinal?: A Little Help from another person bathing (including washing, rinsing, drying)?: A Little Help from another person to put on and taking off regular upper body clothing?: A Little Help from another person to put on and taking off regular lower body clothing?: A Little 6 Click Score: 18    End of Session Equipment Utilized During Treatment: Gait belt;Rolling walker (2 wheels)  OT Visit Diagnosis: Unsteadiness on feet (R26.81);Other abnormalities of gait and mobility (R26.89);Muscle weakness (generalized) (M62.81);Low vision, both eyes (H54.2);Other symptoms and signs involving cognitive function   Activity Tolerance Patient tolerated treatment well   Patient Left in bed;with call bell/phone within reach;with bed alarm set;with  family/visitor present   Nurse Communication Mobility status        Time: 2542-7062 OT Time Calculation (min): 21 min  Charges: OT General Charges $OT Visit: 1 Visit OT Treatments $Self Care/Home Management : 8-22 mins  [name] OTR/L Acute Rehabilitation Services Pager [phone] Office [phone]   [name] M 04/05/2021, 1:11 PM

## 2021-04-06 LAB — CULTURE, BLOOD (ROUTINE X 2)
Culture: NO GROWTH
Culture: NO GROWTH
Special Requests: ADEQUATE

## 2021-04-08 LAB — BODY FLUID CULTURE W GRAM STAIN: Culture: NO GROWTH

## 2021-04-16 DIAGNOSIS — I10 Essential (primary) hypertension: Secondary | ICD-10-CM | POA: Diagnosis not present

## 2021-04-16 DIAGNOSIS — N390 Urinary tract infection, site not specified: Secondary | ICD-10-CM | POA: Diagnosis not present

## 2021-05-23 LAB — CUP PACEART REMOTE DEVICE CHECK
Date Time Interrogation Session: 20221222075633
Implantable Lead Implant Date: 20211227
Implantable Lead Implant Date: 20211227
Implantable Lead Location: 753859
Implantable Lead Location: 753860
Implantable Lead Model: 377
Implantable Lead Model: 377
Implantable Lead Serial Number: 8000018960
Implantable Lead Serial Number: 8000042643
Implantable Pulse Generator Implant Date: 20211227
Pulse Gen Model: 407145
Pulse Gen Serial Number: 69915927

## 2021-05-24 ENCOUNTER — Emergency Department (HOSPITAL_COMMUNITY)
Admission: EM | Admit: 2021-05-24 | Discharge: 2021-05-24 | Disposition: A | Discharge status: Home / Self Care | Payer: Medicare PPO | Attending: Emergency Medicine | Admitting: Emergency Medicine

## 2021-05-24 ENCOUNTER — Encounter (HOSPITAL_COMMUNITY): Payer: Self-pay | Admitting: Emergency Medicine

## 2021-05-24 ENCOUNTER — Emergency Department (HOSPITAL_COMMUNITY): Payer: Medicare PPO

## 2021-05-24 DIAGNOSIS — Z87891 Personal history of nicotine dependence: Secondary | ICD-10-CM | POA: Insufficient documentation

## 2021-05-24 DIAGNOSIS — U071 COVID-19: Secondary | ICD-10-CM | POA: Insufficient documentation

## 2021-05-24 DIAGNOSIS — I119 Hypertensive heart disease without heart failure: Secondary | ICD-10-CM | POA: Insufficient documentation

## 2021-05-24 DIAGNOSIS — I959 Hypotension, unspecified: Secondary | ICD-10-CM | POA: Diagnosis not present

## 2021-05-24 DIAGNOSIS — Z79899 Other long term (current) drug therapy: Secondary | ICD-10-CM | POA: Insufficient documentation

## 2021-05-24 DIAGNOSIS — R531 Weakness: Secondary | ICD-10-CM | POA: Diagnosis not present

## 2021-05-24 DIAGNOSIS — Z86010 Personal history of colonic polyps: Secondary | ICD-10-CM | POA: Diagnosis not present

## 2021-05-24 DIAGNOSIS — R42 Dizziness and giddiness: Secondary | ICD-10-CM | POA: Diagnosis not present

## 2021-05-24 DIAGNOSIS — N390 Urinary tract infection, site not specified: Secondary | ICD-10-CM

## 2021-05-24 DIAGNOSIS — R0902 Hypoxemia: Secondary | ICD-10-CM | POA: Diagnosis not present

## 2021-05-24 DIAGNOSIS — R5383 Other fatigue: Secondary | ICD-10-CM | POA: Diagnosis present

## 2021-05-24 LAB — URINALYSIS, ROUTINE W REFLEX MICROSCOPIC
Bilirubin Urine: NEGATIVE
Glucose, UA: NEGATIVE mg/dL
Ketones, ur: NEGATIVE mg/dL
Nitrite: NEGATIVE
Protein, ur: 30 mg/dL — AB
Specific Gravity, Urine: >1.03 — ABNORMAL HIGH (ref 1.005–1.030)
pH: 5.5 (ref 5.0–8.0)

## 2021-05-24 LAB — CBC WITH DIFFERENTIAL/PLATELET
Abs Immature Granulocytes: 0 10*3/uL (ref 0.00–0.07)
Basophils Absolute: 0 10*3/uL (ref 0.0–0.1)
Basophils Relative: 1 %
Eosinophils Absolute: 0.1 10*3/uL (ref 0.0–0.5)
Eosinophils Relative: 2 %
HCT: 50.5 % (ref 39.0–52.0)
Hemoglobin: 16.9 g/dL (ref 13.0–17.0)
Immature Granulocytes: 0 %
Lymphocytes Relative: 20 %
Lymphs Abs: 0.9 10*3/uL (ref 0.7–4.0)
MCH: 31.5 pg (ref 26.0–34.0)
MCHC: 33.5 g/dL (ref 30.0–36.0)
MCV: 94 fL (ref 80.0–100.0)
Monocytes Absolute: 0.3 10*3/uL (ref 0.1–1.0)
Monocytes Relative: 7 %
Neutro Abs: 3.1 10*3/uL (ref 1.7–7.7)
Neutrophils Relative %: 70 %
Platelets: 114 10*3/uL — ABNORMAL LOW (ref 150–400)
RBC: 5.37 MIL/uL (ref 4.22–5.81)
RDW: 14.8 % (ref 11.5–15.5)
WBC: 4.4 10*3/uL (ref 4.0–10.5)
nRBC: 0 % (ref 0.0–0.2)

## 2021-05-24 LAB — COMPREHENSIVE METABOLIC PANEL
ALT: 18 U/L (ref 0–44)
AST: 20 U/L (ref 15–41)
Albumin: 3.7 g/dL (ref 3.5–5.0)
Alkaline Phosphatase: 60 U/L (ref 38–126)
Anion gap: 8 (ref 5–15)
BUN: 15 mg/dL (ref 8–23)
CO2: 22 mmol/L (ref 22–32)
Calcium: 9.2 mg/dL (ref 8.9–10.3)
Chloride: 108 mmol/L (ref 98–111)
Creatinine, Ser: 0.94 mg/dL (ref 0.61–1.24)
GFR, Estimated: >60 mL/min (ref >60)
Glucose, Bld: 87 mg/dL (ref 70–99)
Potassium: 3.7 mmol/L (ref 3.5–5.1)
Sodium: 138 mmol/L (ref 135–145)
Total Bilirubin: 1 mg/dL (ref 0.3–1.2)
Total Protein: 7.2 g/dL (ref 6.5–8.1)

## 2021-05-24 LAB — RESP PANEL BY RT-PCR (FLU A&B, COVID) ARPGX2
Influenza A by PCR: NEGATIVE
Influenza B by PCR: NEGATIVE
SARS Coronavirus 2 by RT PCR: POSITIVE — AB

## 2021-05-24 LAB — URINALYSIS, MICROSCOPIC (REFLEX)

## 2021-05-24 LAB — AMMONIA: Ammonia: 50 umol/L — ABNORMAL HIGH (ref 9–35)

## 2021-05-24 MED ORDER — CEPHALEXIN 500 MG PO CAPS: 500.0000 mg | ORAL_CAPSULE | Freq: Four times a day (QID) | ORAL | 0 refills | Status: DC

## 2021-05-24 MED ORDER — SODIUM CHLORIDE 0.9 % IV BOLUS
500.0000 mL | Freq: Once | INTRAVENOUS | Status: AC
  Administered 2021-05-24: 12:00:00 500 mL via INTRAVENOUS

## 2021-05-24 NOTE — ED Notes (Signed)
Call sister Levander Campion if patient is going to be discharged.

## 2021-05-24 NOTE — ED Provider Notes (Signed)
[facility] DEPT Provider Note   CSN: [id number] Arrival date & time: 05/24/21  0940     History No chief complaint on file.   Dustin Hamilton is a 80 y.o. male.  Patient was sent over here for mild lethargy.  He was positive for COVID recently  The history is provided by the patient and medical records. No language interpreter was used.  Weakness Severity:  Moderate Onset quality:  Sudden Timing:  Constant Progression:  Waxing and waning Chronicity:  New Context: not alcohol use   Relieved by:  Nothing Worsened by:  Nothing Ineffective treatments:  None tried Associated symptoms: no abdominal pain, no chest pain, no cough, no diarrhea, no frequency, no headaches and no seizures       Past Medical History:  Diagnosis Date   BPH (benign prostatic hyperplasia)    Cerebrovascular disease    Congenital anomaly of diaphragm    Elevated PSA    Glaucoma, both eyes    Hemorrhoid    Hepatitis B surface antigen positive    02-20-2011   History of adenomatous polyp of colon    2007, 2009 and 2013  tubular adenoma's   History of alcohol abuse    quit 1963   History of cerebral parenchymal hemorrhage    01/ 2006  left occiptial lobe related to hypertensive crisis   History of CVA (cerebrovascular accident)    09-12-2012  left hippocampus/ amygdala junction and per MRI old white matter infarcts--  per pt residual short- term memory issues   History of fatty infiltration of liver hx visit's at [facility] , last visit 05/ 2014   elvated LFT's ,  via liver bx 2004 related to hx alcohol and drug abuse (quit 1964)   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   HTN (hypertension)    Renal cyst, left    Stroke ([place])    hx of 3 strokes in past    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    Urethral lesion    urethral mass    Patient Active Problem List   Diagnosis Date Noted    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   Fever and chills 04/01/2021   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   Complete heart block ([place])    Heart block    Syncope 05/03/2020   History of stroke 05/03/2020   History of supraventricular tachycardia 05/03/2020   Thrombocytopenia ([place]) 05/03/2020   Acute kidney injury ([place]) 05/03/2020   Memory impairment 05/03/2020   Atypical chest pain 01/27/2020   SVT (supraventricular tachycardia) ([place]) 06/03/2017   Dizziness 08/02/2014   CVA (cerebral infarction) 09/13/2012   Vertigo 09/12/2012   EAR PAIN 09/05/2009   BACK PAIN 06/20/2009   COLONIC POLYPS 05/06/2007   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   NONDEPENDENT ALCOHOL ABUSE IN REMISSION 05/06/2007   Essential hypertension 05/06/2007   Hypertensive heart disease without heart failure 05/06/2007   INTRACRANIAL HEMORRHAGE 05/06/2007   Cerebrovascular disease 05/06/2007   HEMORRHOIDS 05/06/2007   INGUINAL HERNIA, RIGHT 05/06/2007   RENAL CYST 05/06/2007   HEMATURIA UNSPECIFIED 05/06/2007   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   PSA, INCREASED 05/06/2007   LIVER FUNCTION TESTS, ABNORMAL 05/06/2007   Benign prostatic hyperplasia with urinary obstruction 05/06/2007    Past Surgical History:  Procedure Laterality Date   CARDIOVASCULAR STRESS TEST  05/05/2007   normal nuclear study w/ no ischemia/  normal LV fucntion and  wall motion , ef60%   COLONOSCOPY  last one 04-06-2012   CYSTO/  LEFT RETROGRADE PYELOGRAM/ CYTOLOGY WASHINGS/  URETEROSCOPY  03/05/2000   INGUINAL HERNIA REPAIR Bilateral 1965 and 1980's   LAPAROSCOPIC INGUINAL HERNIA WITH UMBILICAL HERNIA Right 16/03/9603   LIVER BIOPSY  1980's and 2004   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   Procedure: PACEMAKER IMPLANT;  Surgeon: Evans Lance, MD;  Location: [facility];  Service: Cardiovascular;  Laterality: N/A;   SVT ABLATION N/A 11/15/2018   Procedure: SVT ABLATION;  Surgeon: Evans Lance, MD;  Location: [facility];  Service: Cardiovascular;  Laterality: N/A;   TRANSTHORACIC ECHOCARDIOGRAM  09/13/2012   moderate LVH,  ef 60-65%/     TRANSURETHRAL RESECTION OF BLADDER TUMOR N/A 08/11/2016   Procedure: TRANSURETHRAL RESECTION OF BLADDER TUMOR (TURBT);  Surgeon: Cleon Gustin, MD;  Location: [facility];  Service: Urology;  Laterality: N/A;       Family History  Problem Relation Age of Onset   Rheum arthritis Mother    Diabetes Mother    Stroke Mother    Heart attack Mother    Kidney failure Mother    Heart attack Father    Heart disease Maternal Grandmother    Rheum arthritis Maternal Grandmother    Diabetes Maternal Grandmother    Stroke Maternal Grandmother    Colon cancer Neg Hx     Social History   Tobacco Use   Smoking status: Former    Packs/day: 1.00    Years: 5.00    Pack years: 5.00    Types: Cigarettes    Quit date: 11/30/1981    Years since quitting: 39.5   Smokeless tobacco: Never  Vaping Use   Vaping Use: Never used  Substance Use Topics   Alcohol use: No    Alcohol/week: 0.0 standard drinks    Comment: hx abuse -- quit:  1963   Drug use: No    Comment: hx abuse -- quit 1964 (iv heroin and cocaine    Home Medications Prior to Admission medications   Medication Sig Start Date End Date Taking? Authorizing Provider  cephALEXin (KEFLEX) 500 MG capsule Take 1 capsule (500 mg total) by mouth 4 (four) times daily. 05/24/21  Yes Milton Ferguson, MD  allopurinol (ZYLOPRIM) 100 MG tablet Take 1 tablet (100 mg total) by mouth daily. 04/05/21 04/05/22  Pokhrel, Corrie Mckusick, MD  amLODipine (NORVASC) 5 MG tablet Take 0.5 tablets (2.5 mg total) by mouth in the morning and at bedtime. 04/05/21 07/04/21  Pokhrel, Corrie Mckusick, MD  atorvastatin (LIPITOR) 40 MG tablet Take 0.5 tablets (20 mg total) by mouth daily. 04/05/21   Pokhrel, Corrie Mckusick, MD  brimonidine (ALPHAGAN) 0.2 % ophthalmic solution Place 1 drop into both eyes 2 (two) times daily.    [provider]   dutasteride (AVODART) 0.5 MG capsule Take 0.5 mg by mouth daily.    [provider]  latanoprost (XALATAN) 0.005 % ophthalmic solution Place 1 drop into both eyes at bedtime.     [provider]  memantine (NAMENDA) 10 MG tablet Take 20 mg by mouth at bedtime.    [provider]  metoprolol succinate (TOPROL-XL) 25 MG 24 hr tablet Take 0.5 tablets (12.5 mg total) by mouth at bedtime. 05/31/20   Lilland, Alana, DO  Naphazoline HCl (CLEAR EYES OP) Apply 2 drops to eye daily as needed (dry eyes).    [provider]  Nutritional Supplements (Ali Chukson) Take 1  Bottle by mouth as needed (appetite, nutrition).    [provider]  tamsulosin (FLOMAX) 0.4 MG CAPS capsule Take 0.4 mg by mouth at bedtime.    [provider]    Allergies    Aricept [donepezil], Penicillins, and Viagra [sildenafil]  Review of Systems   Review of Systems  Constitutional:  Negative for appetite change and fatigue.  HENT:  Negative for congestion, ear discharge and sinus pressure.   Eyes:  Negative for discharge.  Respiratory:  Negative for cough.   Cardiovascular:  Negative for chest pain.  Gastrointestinal:  Negative for abdominal pain and diarrhea.  Genitourinary:  Negative for frequency and hematuria.  Musculoskeletal:  Negative for back pain.  Skin:  Negative for rash.  Neurological:  Positive for weakness. Negative for seizures and headaches.  Psychiatric/Behavioral:  Negative for hallucinations.    Physical Exam Updated Vital Signs BP (!) 148/85    Pulse (!) 57    Temp 97.8 F (36.6 C) (Oral)    Resp 16    Ht 5\' 11"  (1.803 m)    Wt 74.1 kg    SpO2 98%    BMI 22.79 kg/m   Physical Exam Vitals and nursing note reviewed.  Constitutional:      Appearance: He is well-developed.  HENT:     Head: Normocephalic.     Nose: Nose normal.  Eyes:     General: No scleral icterus.    Conjunctiva/sclera: Conjunctivae normal.  Neck:      Thyroid: No thyromegaly.  Cardiovascular:     Rate and Rhythm: Normal rate and regular rhythm.     Heart sounds: No murmur heard.   No friction rub. No gallop.  Pulmonary:     Breath sounds: No stridor. No wheezing or rales.  Chest:     Chest wall: No tenderness.  Abdominal:     General: There is no distension.     Tenderness: There is no abdominal tenderness. There is no rebound.  Musculoskeletal:        General: Normal range of motion.     Cervical back: Neck supple.  Lymphadenopathy:     Cervical: No cervical adenopathy.  Skin:    Findings: No erythema or rash.  Neurological:     Mental Status: He is alert and oriented to person, place, and time.     Motor: No abnormal muscle tone.     Coordination: Coordination normal.  Psychiatric:        Behavior: Behavior normal.    ED Results / Procedures / Treatments   Labs (all labs ordered are listed, but only abnormal results are displayed) Labs Reviewed  RESP PANEL BY RT-PCR (FLU A&B, COVID) ARPGX2 - Abnormal; Notable for the following components:      Result Value   SARS Coronavirus 2 by RT PCR POSITIVE (*)    All other components within normal limits  CBC WITH DIFFERENTIAL/PLATELET - Abnormal; Notable for the following components:   Platelets 114 (*)    All other components within normal limits  URINALYSIS, ROUTINE W REFLEX MICROSCOPIC - Abnormal; Notable for the following components:   Specific Gravity, Urine >1.030 (*)    Hgb urine dipstick SMALL (*)    Protein, ur 30 (*)    Leukocytes,Ua SMALL (*)    All other components within normal limits  AMMONIA - Abnormal; Notable for the following components:   Ammonia 50 (*)    All other components within normal limits  URINALYSIS, MICROSCOPIC (REFLEX) - Abnormal; Notable for  the following components:   Bacteria, UA MANY (*)    All other components within normal limits  URINE CULTURE  COMPREHENSIVE METABOLIC PANEL    EKG None  Radiology CT Head Wo Contrast  Result  Date: 05/24/2021 CLINICAL DATA:  Dizziness.  Alert and oriented EXAM: CT HEAD WITHOUT CONTRAST TECHNIQUE: Contiguous axial images were obtained from the base of the skull through the vertex without intravenous contrast. COMPARISON:  04/01/2021 FINDINGS: Brain: No evidence of acute infarction, hemorrhage, extra-axial collection, ventriculomegaly, or mass effect. Generalized cerebral atrophy. Periventricular white matter low attenuation likely secondary to microangiopathy. Vascular: Cerebrovascular atherosclerotic calcifications are noted. Skull: Negative for fracture or focal lesion. Sinuses/Orbits: Visualized portions of the orbits are unremarkable. Visualized portions of the paranasal sinuses are unremarkable. Visualized portions of the mastoid air cells are unremarkable. Other: None. IMPRESSION: 1. No acute intracranial pathology. 2. Chronic microvascular disease and cerebral atrophy. Electronically Signed   By: Kathreen Devoid M.D.   On: 05/24/2021 11:01   CUP PACEART REMOTE DEVICE CHECK  Result Date: 05/23/2021 Scheduled remote reviewed. Normal device function.  Mean HR trending up Next remote 91 days. LR   Procedures Procedures   Medications Ordered in ED Medications  sodium chloride 0.9 % bolus 500 mL (0 mLs Intravenous Stopped 05/24/21 1218)    ED Course  I have reviewed the triage vital signs and the nursing notes.  Pertinent labs & imaging results that were available during my care of the patient were reviewed by me and considered in my medical decision making (see chart for details).    MDM Rules/Calculators/A&P                         Patient with COVID infection and urinary tract infection.  He is sent home with Keflex and will follow up with his doctor next week    Final Clinical Impression(s) / ED Diagnoses Final diagnoses:  Acute UTI  COVID-19    Rx / DC Orders ED Discharge Orders          Ordered    cephALEXin (KEFLEX) 500 MG capsule  4 times daily        05/24/21  1342             Milton Ferguson, MD 05/24/21 1345

## 2021-05-24 NOTE — Discharge Instructions (Signed)
Take Tylenol for any aches and fever drink plenty of fluids and follow-up with your doctor next week for recheck

## 2021-05-24 NOTE — ED Notes (Signed)
Patient transported to CT 

## 2021-05-24 NOTE — ED Triage Notes (Signed)
Pt here from home , wife called ems b/c she could not wake him up , pt alert and oriented on arrival , dx with covid on Wednesday , cbg 130

## 2021-05-25 LAB — URINE CULTURE: Culture: <10000 — AB

## 2021-05-28 ENCOUNTER — Ambulatory Visit (INDEPENDENT_AMBULATORY_CARE_PROVIDER_SITE_OTHER): Payer: Medicare PPO

## 2021-05-28 DIAGNOSIS — I442 Atrioventricular block, complete: Secondary | ICD-10-CM

## 2021-05-30 ENCOUNTER — Encounter (HOSPITAL_COMMUNITY): Payer: Self-pay

## 2021-05-30 ENCOUNTER — Ambulatory Visit
Admission: EM | Admit: 2021-05-30 | Discharge: 2021-05-30 | Disposition: A | Discharge status: Home / Self Care | Payer: Non-veteran care | Attending: Physician Assistant | Admitting: Physician Assistant

## 2021-05-30 ENCOUNTER — Encounter: Payer: Self-pay | Admitting: Emergency Medicine

## 2021-05-30 ENCOUNTER — Inpatient Hospital Stay (HOSPITAL_COMMUNITY)
Admission: EM | Admit: 2021-05-30 | Discharge: 2021-06-02 | DRG: 689 | Disposition: A | Discharge status: Home / Self Care | Payer: No Typology Code available for payment source | Attending: Internal Medicine | Admitting: Internal Medicine

## 2021-05-30 DIAGNOSIS — Z841 Family history of disorders of kidney and ureter: Secondary | ICD-10-CM

## 2021-05-30 DIAGNOSIS — U071 COVID-19: Secondary | ICD-10-CM | POA: Diagnosis present

## 2021-05-30 DIAGNOSIS — Z880 Allergy status to penicillin: Secondary | ICD-10-CM

## 2021-05-30 DIAGNOSIS — Z888 Allergy status to other drugs, medicaments and biological substances status: Secondary | ICD-10-CM

## 2021-05-30 DIAGNOSIS — R531 Weakness: Secondary | ICD-10-CM

## 2021-05-30 DIAGNOSIS — Z8673 Personal history of transient ischemic attack (TIA), and cerebral infarction without residual deficits: Secondary | ICD-10-CM

## 2021-05-30 DIAGNOSIS — R5383 Other fatigue: Secondary | ICD-10-CM

## 2021-05-30 DIAGNOSIS — Z79899 Other long term (current) drug therapy: Secondary | ICD-10-CM

## 2021-05-30 DIAGNOSIS — G9341 Metabolic encephalopathy: Secondary | ICD-10-CM | POA: Diagnosis present

## 2021-05-30 DIAGNOSIS — N390 Urinary tract infection, site not specified: Secondary | ICD-10-CM | POA: Diagnosis not present

## 2021-05-30 DIAGNOSIS — E785 Hyperlipidemia, unspecified: Secondary | ICD-10-CM | POA: Diagnosis present

## 2021-05-30 DIAGNOSIS — E860 Dehydration: Secondary | ICD-10-CM | POA: Diagnosis present

## 2021-05-30 DIAGNOSIS — Z20822 Contact with and (suspected) exposure to covid-19: Secondary | ICD-10-CM | POA: Diagnosis not present

## 2021-05-30 DIAGNOSIS — M109 Gout, unspecified: Secondary | ICD-10-CM | POA: Diagnosis present

## 2021-05-30 DIAGNOSIS — E876 Hypokalemia: Secondary | ICD-10-CM | POA: Diagnosis present

## 2021-05-30 DIAGNOSIS — N400 Enlarged prostate without lower urinary tract symptoms: Secondary | ICD-10-CM | POA: Diagnosis present

## 2021-05-30 DIAGNOSIS — Z8249 Family history of ischemic heart disease and other diseases of the circulatory system: Secondary | ICD-10-CM

## 2021-05-30 DIAGNOSIS — Z833 Family history of diabetes mellitus: Secondary | ICD-10-CM

## 2021-05-30 DIAGNOSIS — Z823 Family history of stroke: Secondary | ICD-10-CM

## 2021-05-30 DIAGNOSIS — A0472 Enterocolitis due to Clostridium difficile, not specified as recurrent: Secondary | ICD-10-CM | POA: Diagnosis present

## 2021-05-30 DIAGNOSIS — I10 Essential (primary) hypertension: Secondary | ICD-10-CM | POA: Diagnosis present

## 2021-05-30 DIAGNOSIS — F0150 Vascular dementia without behavioral disturbance: Secondary | ICD-10-CM | POA: Diagnosis present

## 2021-05-30 DIAGNOSIS — Z8261 Family history of arthritis: Secondary | ICD-10-CM

## 2021-05-30 DIAGNOSIS — Z87891 Personal history of nicotine dependence: Secondary | ICD-10-CM

## 2021-05-30 LAB — CBC
HCT: 49.9 % (ref 39.0–52.0)
Hemoglobin: 16.6 g/dL (ref 13.0–17.0)
MCH: 31.1 pg (ref 26.0–34.0)
MCHC: 33.3 g/dL (ref 30.0–36.0)
MCV: 93.6 fL (ref 80.0–100.0)
Platelets: 148 10*3/uL — ABNORMAL LOW (ref 150–400)
RBC: 5.33 MIL/uL (ref 4.22–5.81)
RDW: 14.6 % (ref 11.5–15.5)
WBC: 15.3 10*3/uL — ABNORMAL HIGH (ref 4.0–10.5)
nRBC: 0 % (ref 0.0–0.2)

## 2021-05-30 LAB — BASIC METABOLIC PANEL
Anion gap: 8 (ref 5–15)
BUN: 17 mg/dL (ref 8–23)
CO2: 24 mmol/L (ref 22–32)
Calcium: 9.9 mg/dL (ref 8.9–10.3)
Chloride: 109 mmol/L (ref 98–111)
Creatinine, Ser: 1.38 mg/dL — ABNORMAL HIGH (ref 0.61–1.24)
GFR, Estimated: 52 mL/min — ABNORMAL LOW (ref >60)
Glucose, Bld: 117 mg/dL — ABNORMAL HIGH (ref 70–99)
Potassium: 3.6 mmol/L (ref 3.5–5.1)
Sodium: 141 mmol/L (ref 135–145)

## 2021-05-30 LAB — POCT FASTING CBG KUC MANUAL ENTRY: POCT Glucose (KUC): 117 mg/dL — AB (ref 70–99)

## 2021-05-30 NOTE — ED Triage Notes (Addendum)
Recovering from covid per wife. States he's been excessively weak starting the morning, falling asleep. Arousing to voice and follows commands in triage. Finished paxlovid on Sunday. Wife also reports he's had intermittent diarrhea since taking paxlovid. Too weak to walk to triage, falling asleep during triage.

## 2021-05-30 NOTE — ED Provider Notes (Addendum)
[facility]    CSN: [id number] Arrival date & time: 05/30/21  1352      History   Chief Complaint Chief Complaint  Patient presents with   Weakness    HPI Dustin Hamilton is a 80 y.o. male.   Patient here today for evaluation of generalized weakness and fatigue that started recently as he is recovering from Covid. He finished taking paxlovid about 4 days ago. He did have diarrhea associated with treatment. He is now having difficulty staying awake which prompted wife to bring him in for evaluation.   The history is provided by the spouse.  Weakness Associated symptoms: diarrhea   Associated symptoms: no fever and no shortness of breath    Past Medical History:  Diagnosis Date   BPH (benign prostatic hyperplasia)    Cerebrovascular disease    Congenital anomaly of diaphragm    Elevated PSA    Glaucoma, both eyes    Hemorrhoid    Hepatitis B surface antigen positive    02-20-2011   History of adenomatous polyp of colon    2007, 2009 and 2013  tubular adenoma's   History of alcohol abuse    quit 1963   History of cerebral parenchymal hemorrhage    01/ 2006  left occiptial lobe related to hypertensive crisis   History of CVA (cerebrovascular accident)    09-12-2012  left hippocampus/ amygdala junction and per MRI old white matter infarcts--  per pt residual short- term memory issues   History of fatty infiltration of liver hx visit's at [facility] , last visit 05/ 2014   elvated LFT's ,  via liver bx 2004 related to hx alcohol and drug abuse (quit 1964)   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   HTN (hypertension)    Renal cyst, left    Stroke ([place])    hx of 3 strokes in past    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    Urethral lesion    urethral mass    Patient Active Problem List   Diagnosis Date Noted   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   Fever and chills  04/01/2021   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   Complete heart block ([place])    Heart block    Syncope 05/03/2020   History of stroke 05/03/2020   History of supraventricular tachycardia 05/03/2020   Thrombocytopenia ([place]) 05/03/2020   Acute kidney injury ([place]) 05/03/2020   Memory impairment 05/03/2020   Atypical chest pain 01/27/2020   SVT (supraventricular tachycardia) ([place]) 06/03/2017   Dizziness 08/02/2014   CVA (cerebral infarction) 09/13/2012   Vertigo 09/12/2012   EAR PAIN 09/05/2009   BACK PAIN 06/20/2009   COLONIC POLYPS 05/06/2007   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   NONDEPENDENT ALCOHOL ABUSE IN REMISSION 05/06/2007   Essential hypertension 05/06/2007   Hypertensive heart disease without heart failure 05/06/2007   INTRACRANIAL HEMORRHAGE 05/06/2007   Cerebrovascular disease 05/06/2007   HEMORRHOIDS 05/06/2007   INGUINAL HERNIA, RIGHT 05/06/2007   RENAL CYST 05/06/2007   HEMATURIA UNSPECIFIED 05/06/2007   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   PSA, INCREASED 05/06/2007   LIVER FUNCTION TESTS, ABNORMAL 05/06/2007   Benign prostatic hyperplasia with urinary obstruction 05/06/2007    Past Surgical History:  Procedure Laterality Date   CARDIOVASCULAR STRESS TEST  05/05/2007   normal nuclear study w/ no ischemia/  normal LV fucntion and wall motion , ef60%   COLONOSCOPY  last one 04-06-2012   CYSTO/  LEFT RETROGRADE PYELOGRAM/ CYTOLOGY WASHINGS/  URETEROSCOPY  03/05/2000   INGUINAL HERNIA REPAIR Bilateral 1965 and 1980's   LAPAROSCOPIC INGUINAL HERNIA WITH UMBILICAL HERNIA Right 90/24/0973   LIVER BIOPSY  1980's and 2004   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   Procedure: PACEMAKER IMPLANT;  Surgeon: Evans Lance, MD;  Location: [facility];  Service: Cardiovascular;  Laterality: N/A;   SVT ABLATION N/A 11/15/2018   Procedure: SVT ABLATION;  Surgeon: Evans Lance, MD;  Location: [facility];  Service: Cardiovascular;  Laterality: N/A;    TRANSTHORACIC ECHOCARDIOGRAM  09/13/2012   moderate LVH,  ef 60-65%/     TRANSURETHRAL RESECTION OF BLADDER TUMOR N/A 08/11/2016   Procedure: TRANSURETHRAL RESECTION OF BLADDER TUMOR (TURBT);  Surgeon: Cleon Gustin, MD;  Location[facility];  Service: Urology;  Laterality: N/A;       Home Medications    Prior to Admission medications   Medication Sig Start Date End Date Taking? Authorizing Provider  allopurinol (ZYLOPRIM) 100 MG tablet Take 1 tablet (100 mg total) by mouth daily. 04/05/21 04/05/22  Pokhrel, Corrie Mckusick, MD  amLODipine (NORVASC) 5 MG tablet Take 0.5 tablets (2.5 mg total) by mouth in the morning and at bedtime. 04/05/21 07/04/21  Pokhrel, Corrie Mckusick, MD  atorvastatin (LIPITOR) 40 MG tablet Take 0.5 tablets (20 mg total) by mouth daily. 04/05/21   Pokhrel, Corrie Mckusick, MD  brimonidine (ALPHAGAN) 0.2 % ophthalmic solution Place 1 drop into both eyes 2 (two) times daily.    [provider]  cephALEXin (KEFLEX) 500 MG capsule Take 1 capsule (500 mg total) by mouth 4 (four) times daily. 05/24/21   Milton Ferguson, MD  dutasteride (AVODART) 0.5 MG capsule Take 0.5 mg by mouth daily.    [provider]  latanoprost (XALATAN) 0.005 % ophthalmic solution Place 1 drop into both eyes at bedtime.     [provider]  memantine (NAMENDA) 10 MG tablet Take 20 mg by mouth at bedtime.    [provider]  metoprolol succinate (TOPROL-XL) 25 MG 24 hr tablet Take 0.5 tablets (12.5 mg total) by mouth at bedtime. 05/31/20   Lilland, Alana, DO  Naphazoline HCl (CLEAR EYES OP) Apply 2 drops to eye daily as needed (dry eyes).    [provider]  Nutritional Supplements ([place]) Take 1 Bottle by mouth as needed (appetite, nutrition).    [provider]  tamsulosin (FLOMAX) 0.4 MG CAPS capsule Take 0.4 mg by mouth at bedtime.    [provider]    Family History Family History  Problem Relation Age of Onset    Rheum arthritis Mother    Diabetes Mother    Stroke Mother    Heart attack Mother    Kidney failure Mother    Heart attack Father    Heart disease Maternal Grandmother    Rheum arthritis Maternal Grandmother    Diabetes Maternal Grandmother    Stroke Maternal Grandmother    Colon cancer Neg Hx     Social History Social History   Tobacco Use   Smoking status: Former    Packs/day: 1.00    Years: 5.00    Pack years: 5.00    Types: Cigarettes    Quit date: 11/30/1981    Years since quitting: 39.5   Smokeless tobacco: Never  Vaping Use   Vaping Use: Never used  Substance Use Topics   Alcohol use: No  Alcohol/week: 0.0 standard drinks    Comment: hx abuse -- quit:  1963   Drug use: No    Comment: hx abuse -- quit 1964 (iv heroin and cocaine     Allergies   Aricept [donepezil], Penicillins, and Viagra [sildenafil]   Review of Systems Review of Systems  Constitutional:  Positive for fatigue. Negative for chills and fever.  Eyes:  Negative for discharge and redness.  Respiratory:  Negative for shortness of breath.   Gastrointestinal:  Positive for diarrhea.  Neurological:  Positive for weakness.    Physical Exam Triage Vital Signs ED Triage Vitals  Enc Vitals Group     BP 05/30/21 1446 93/63     Pulse Rate 05/30/21 1446 (!) 106     Resp 05/30/21 1446 18     Temp 05/30/21 1446 97.9 F (36.6 C)     Temp Source 05/30/21 1446 Oral     SpO2 05/30/21 1446 91 %     Weight --      Height --      Head Circumference --      Peak Flow --      Pain Score 05/30/21 1447 0     Pain Loc --      Pain Edu? --      Excl. in [place]? --    No data found.  Updated Vital Signs BP 93/63 (BP Location: Right Arm)    Pulse (!) 106    Temp 97.9 F (36.6 C) (Oral)    Resp 18    SpO2 91%     Physical Exam Vitals and nursing note reviewed.  Constitutional:      General: He is in acute distress.     Appearance: He is ill-appearing (appears weak, unable to walk).     Comments:  Patient frequently falls asleep while conversing with patient and wife despite sitting upright  HENT:     Head: Normocephalic.  Eyes:     Conjunctiva/sclera: Conjunctivae normal.  Cardiovascular:     Rate and Rhythm: Regular rhythm. Tachycardia present.     Comments: No edema to bilateral lower extremities Pulmonary:     Effort: Pulmonary effort is normal. No respiratory distress.     Breath sounds: Normal breath sounds.  Neurological:     Motor: Weakness present.     UC Treatments / Results  Labs (all labs ordered are listed, but only abnormal results are displayed) Labs Reviewed  POCT FASTING CBG [place]    EKG   Radiology No results found.  Procedures Procedures (including critical care time)  Medications Ordered in UC Medications - No data to display  Initial Impression / Assessment and Plan / UC Course  I have reviewed the triage vital signs and the nursing notes.  Pertinent labs & imaging results that were available during my care of the patient were reviewed by me and considered in my medical decision making (see chart for details).    Blood pressure much lower than typical and heart rate much higher. Patient significantly lethargic on exam. CBG 117 in office. Patient will need further evaluation in the ED. Patient's wife does not feel she can transport him safely. EMS notified.   Final Clinical Impressions(s) / UC Diagnoses   Final diagnoses:  Lethargic   Discharge Instructions   None    ED Prescriptions   None    PDMP not reviewed this encounter.   Francene Finders, PA-C 05/30/21 1506    Francene Finders, PA-C  05/30/21 1507 ° °

## 2021-05-30 NOTE — ED Notes (Signed)
EMS on site, no ekg performed due to their arrival.

## 2021-05-30 NOTE — ED Notes (Signed)
EMS notified for need of emergent transport.

## 2021-05-30 NOTE — ED Notes (Signed)
Patient is being discharged from the Urgent Care and sent to the Emergency Department via EMS . Per Ewell Poe PA, patient is in need of higher level of care due to lethargy, weakness. Patient is aware and verbalizes understanding of plan of care.  Vitals:   05/30/21 1446  BP: 93/63  Pulse: (!) 106  Resp: 18  Temp: 97.9 F (36.6 C)  SpO2: 91%

## 2021-05-30 NOTE — ED Triage Notes (Signed)
Pt arrived via EMS, from [facility] urgent care, decreased oral intake. COVID positive x2 wks ago. Orthostatic on EMS arrival.

## 2021-05-31 ENCOUNTER — Other Ambulatory Visit: Payer: Self-pay

## 2021-05-31 ENCOUNTER — Emergency Department (HOSPITAL_COMMUNITY): Payer: No Typology Code available for payment source

## 2021-05-31 DIAGNOSIS — G9341 Metabolic encephalopathy: Secondary | ICD-10-CM | POA: Diagnosis present

## 2021-05-31 DIAGNOSIS — R531 Weakness: Secondary | ICD-10-CM | POA: Diagnosis not present

## 2021-05-31 LAB — CBC WITH DIFFERENTIAL/PLATELET
Abs Immature Granulocytes: 0.04 10*3/uL (ref 0.00–0.07)
Basophils Absolute: 0 10*3/uL (ref 0.0–0.1)
Basophils Relative: 0 %
Eosinophils Absolute: 0.1 10*3/uL (ref 0.0–0.5)
Eosinophils Relative: 1 %
HCT: 48.7 % (ref 39.0–52.0)
Hemoglobin: 16.4 g/dL (ref 13.0–17.0)
Immature Granulocytes: 0 %
Lymphocytes Relative: 11 %
Lymphs Abs: 1.2 10*3/uL (ref 0.7–4.0)
MCH: 31.6 pg (ref 26.0–34.0)
MCHC: 33.7 g/dL (ref 30.0–36.0)
MCV: 93.8 fL (ref 80.0–100.0)
Monocytes Absolute: 0.4 10*3/uL (ref 0.1–1.0)
Monocytes Relative: 4 %
Neutro Abs: 8.6 10*3/uL — ABNORMAL HIGH (ref 1.7–7.7)
Neutrophils Relative %: 84 %
Platelets: 156 10*3/uL (ref 150–400)
RBC: 5.19 MIL/uL (ref 4.22–5.81)
RDW: 14.7 % (ref 11.5–15.5)
WBC: 10.4 10*3/uL (ref 4.0–10.5)
nRBC: 0 % (ref 0.0–0.2)

## 2021-05-31 LAB — RESP PANEL BY RT-PCR (FLU A&B, COVID) ARPGX2
Influenza A by PCR: NEGATIVE
Influenza B by PCR: NEGATIVE
SARS Coronavirus 2 by RT PCR: POSITIVE — AB

## 2021-05-31 LAB — COMPREHENSIVE METABOLIC PANEL
ALT: 20 U/L (ref 0–44)
AST: 19 U/L (ref 15–41)
Albumin: 4.1 g/dL (ref 3.5–5.0)
Alkaline Phosphatase: 55 U/L (ref 38–126)
Anion gap: 10 (ref 5–15)
BUN: 25 mg/dL — ABNORMAL HIGH (ref 8–23)
CO2: 24 mmol/L (ref 22–32)
Calcium: 9.4 mg/dL (ref 8.9–10.3)
Chloride: 106 mmol/L (ref 98–111)
Creatinine, Ser: 1.24 mg/dL (ref 0.61–1.24)
GFR, Estimated: 59 mL/min — ABNORMAL LOW (ref >60)
Glucose, Bld: 99 mg/dL (ref 70–99)
Potassium: 3.4 mmol/L — ABNORMAL LOW (ref 3.5–5.1)
Sodium: 140 mmol/L (ref 135–145)
Total Bilirubin: 1 mg/dL (ref 0.3–1.2)
Total Protein: 8.2 g/dL — ABNORMAL HIGH (ref 6.5–8.1)

## 2021-05-31 LAB — URINALYSIS, ROUTINE W REFLEX MICROSCOPIC
Bilirubin Urine: NEGATIVE
Glucose, UA: NEGATIVE mg/dL
Ketones, ur: 5 mg/dL — AB
Nitrite: NEGATIVE
Protein, ur: 100 mg/dL — AB
Specific Gravity, Urine: 1.021 (ref 1.005–1.030)
WBC, UA: >50 WBC/hpf — ABNORMAL HIGH (ref 0–5)
pH: 5 (ref 5.0–8.0)

## 2021-05-31 LAB — C DIFFICILE QUICK SCREEN W PCR REFLEX
C Diff antigen: POSITIVE — AB
C Diff interpretation: DETECTED
C Diff toxin: POSITIVE — AB

## 2021-05-31 LAB — MAGNESIUM: Magnesium: 1.9 mg/dL (ref 1.7–2.4)

## 2021-05-31 MED ORDER — HYDRALAZINE HCL 20 MG/ML IJ SOLN: 10.0000 mg | Freq: Three times a day (TID) | INTRAMUSCULAR | Status: DC | PRN

## 2021-05-31 MED ORDER — SODIUM CHLORIDE 0.9 % IV BOLUS: 500.0000 mL | Freq: Once | INTRAVENOUS | Status: DC

## 2021-05-31 MED ORDER — ACETAMINOPHEN 325 MG PO TABS: 650.0000 mg | ORAL_TABLET | Freq: Four times a day (QID) | ORAL | Status: DC | PRN

## 2021-05-31 MED ORDER — VANCOMYCIN HCL 125 MG PO CAPS
125.0000 mg | ORAL_CAPSULE | Freq: Four times a day (QID) | ORAL | Status: DC
  Administered 2021-05-31 – 2021-06-02 (×7): 125 mg via ORAL
  Filled 2021-05-31 (×9): qty 1

## 2021-05-31 MED ORDER — POTASSIUM CHLORIDE CRYS ER 20 MEQ PO TBCR
40.0000 meq | EXTENDED_RELEASE_TABLET | Freq: Every day | ORAL | Status: DC
  Administered 2021-06-01 – 2021-06-02 (×2): 40 meq via ORAL
  Filled 2021-05-31 (×2): qty 2

## 2021-05-31 MED ORDER — SODIUM CHLORIDE 0.9 % IV SOLN: Freq: Once | INTRAVENOUS | Status: AC

## 2021-05-31 MED ORDER — ONDANSETRON HCL 4 MG PO TABS: 4.0000 mg | ORAL_TABLET | Freq: Four times a day (QID) | ORAL | Status: DC | PRN

## 2021-05-31 MED ORDER — ENSURE ENLIVE PO LIQD
237.0000 mL | Freq: Two times a day (BID) | ORAL | Status: DC
  Administered 2021-06-01: 237 mL via ORAL

## 2021-05-31 MED ORDER — SODIUM CHLORIDE 0.9 % IV BOLUS
1000.0000 mL | Freq: Once | INTRAVENOUS | Status: AC
  Administered 2021-05-31: 22:00:00 1000 mL via INTRAVENOUS

## 2021-05-31 MED ORDER — SODIUM CHLORIDE 0.9 % IV SOLN
2.0000 g | Freq: Once | INTRAVENOUS | Status: AC
  Administered 2021-05-31: 12:00:00 2 g via INTRAVENOUS
  Filled 2021-05-31: qty 2

## 2021-05-31 MED ORDER — ACETAMINOPHEN 650 MG RE SUPP: 650.0000 mg | Freq: Four times a day (QID) | RECTAL | Status: DC | PRN

## 2021-05-31 MED ORDER — SODIUM CHLORIDE 0.9 % IV BOLUS
500.0000 mL | Freq: Once | INTRAVENOUS | Status: AC
  Administered 2021-05-31: 11:00:00 500 mL via INTRAVENOUS

## 2021-05-31 MED ORDER — ONDANSETRON HCL 4 MG/2ML IJ SOLN: 4.0000 mg | Freq: Four times a day (QID) | INTRAMUSCULAR | Status: DC | PRN

## 2021-05-31 NOTE — ED Notes (Signed)
Lab called advising pt's Stool sample was positive for c-diff antigens and c-diff toxins.

## 2021-05-31 NOTE — Progress Notes (Addendum)
Pt. Arrived to room 1512. A& O X  4.  Low BP -84/58 on arrival to the unit. Lovey Newcomer NP notified.  NS 500 mL bolus started.

## 2021-05-31 NOTE — Progress Notes (Signed)
A consult was received from an ED physician for Cefepime per pharmacy dosing.  The patient's profile has been reviewed for ht/wt/allergies/indication/available labs.   A one time order has been placed for Cefepime 2g.  Further antibiotics/pharmacy consults should be ordered by admitting physician if indicated.                       Thank you,  Gretta Arab PharmD, BCPS [facility] main pharmacy [phone] 05/31/2021 11:36 AM

## 2021-05-31 NOTE — H&P (Signed)
History and Physical    KEYLEN ECKENRODE IWL:[id number] DOB: 25-Feb-1941 DOA: 05/30/2021  PCP: [facility]  Patient coming from: Home  Chief Complaint: diarrhea, lethargy  HPI: Dustin Hamilton is a 80 y.o. male with medical history significant of HTN, HLD, BPH, gout, glaucoma. Presenting with diarrhea and lethargy. History is from his wife. She reports that he was diagnosed with COVID and a UTI about a week ago. He was given a prescription for paxlovid and keflex. He completed that paxlovid and he had a couple of doses left of the keflex. He was initially feeling better, but over the last 24 - 36 hours, she noted that he was not eating as well and he had several runs of diarrhea. Over the last day, he has been more lethargic. His change concerned his wife, so she had him brought to the ED for evaluation.    ED Course: He was noted to have a dirty UA. He was started on abx. [facility] was called for admission.   Review of Systems:  Denies CP, palpitations, abdominal pain, N/V, fever. Review of systems is otherwise negative for all not mentioned in HPI.   PMHx Past Medical History:  Diagnosis Date   BPH (benign prostatic hyperplasia)    Cerebrovascular disease    Congenital anomaly of diaphragm    Elevated PSA    Glaucoma, both eyes    Hemorrhoid    Hepatitis B surface antigen positive    02-20-2011   History of adenomatous polyp of colon    2007, 2009 and 2013  tubular adenoma's   History of alcohol abuse    quit 1963   History of cerebral parenchymal hemorrhage    01/ 2006  left occiptial lobe related to hypertensive crisis   History of CVA (cerebrovascular accident)    09-12-2012  left hippocampus/ amygdala junction and per MRI old white matter infarcts--  per pt residual short- term memory issues   History of fatty infiltration of liver hx visit's at [facility] , last visit 05/ 2014   elvated LFT's ,  via liver bx 2004 related to hx alcohol and drug abuse (quit  1964)   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   HTN (hypertension)    Renal cyst, left    Stroke ([place])    hx of 3 strokes in past    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    Urethral lesion    urethral mass    PSHx Past Surgical History:  Procedure Laterality Date   CARDIOVASCULAR STRESS TEST  05/05/2007   normal nuclear study w/ no ischemia/  normal LV fucntion and wall motion , ef60%   COLONOSCOPY  last one 04-06-2012   CYSTO/  LEFT RETROGRADE PYELOGRAM/ CYTOLOGY WASHINGS/  URETEROSCOPY  03/05/2000   INGUINAL HERNIA REPAIR Bilateral 1965 and 1980's   LAPAROSCOPIC INGUINAL HERNIA WITH UMBILICAL HERNIA Right 17/40/8144   LIVER BIOPSY  1980's and 2004   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   Procedure: PACEMAKER IMPLANT;  Surgeon: Evans Lance, MD;  Location: [facility];  Service: Cardiovascular;  Laterality: N/A;   SVT ABLATION N/A 11/15/2018   Procedure: SVT ABLATION;  Surgeon: Evans Lance, MD;  Location: [name];  Service: Cardiovascular;  Laterality: N/A;   TRANSTHORACIC ECHOCARDIOGRAM  09/13/2012   moderate LVH,  ef 60-65%/     TRANSURETHRAL RESECTION OF BLADDER TUMOR N/A 08/11/2016  Procedure: TRANSURETHRAL RESECTION OF BLADDER TUMOR (TURBT);  Surgeon: Cleon Gustin, MD;  Location: [facility];  Service: Urology;  Laterality: N/A;    SocHx  reports that he quit smoking about 39 years ago. His smoking use included cigarettes. He has a 5.00 pack-year smoking history. He has never used smokeless tobacco. He reports that he does not drink alcohol and does not use drugs.  Allergies  Allergen Reactions   Aricept [Donepezil]     dizziness   Penicillins Hives   Viagra [Sildenafil]     dizziness    FamHx Family History  Problem Relation Age of Onset   Rheum arthritis Mother    Diabetes Mother    Stroke Mother    Heart attack Mother    Kidney failure Mother    Heart attack  Father    Heart disease Maternal Grandmother    Rheum arthritis Maternal Grandmother    Diabetes Maternal Grandmother    Stroke Maternal Grandmother    Colon cancer Neg Hx     Prior to Admission medications   Medication Sig Start Date End Date Taking? Authorizing Provider  allopurinol (ZYLOPRIM) 100 MG tablet Take 1 tablet (100 mg total) by mouth daily. 04/05/21 04/05/22  Pokhrel, Corrie Mckusick, MD  amLODipine (NORVASC) 5 MG tablet Take 0.5 tablets (2.5 mg total) by mouth in the morning and at bedtime. 04/05/21 07/04/21  Pokhrel, Corrie Mckusick, MD  atorvastatin (LIPITOR) 40 MG tablet Take 0.5 tablets (20 mg total) by mouth daily. 04/05/21   Pokhrel, Corrie Mckusick, MD  brimonidine (ALPHAGAN) 0.2 % ophthalmic solution Place 1 drop into both eyes 2 (two) times daily.    [provider]  cephALEXin (KEFLEX) 500 MG capsule Take 1 capsule (500 mg total) by mouth 4 (four) times daily. 05/24/21   Milton Ferguson, MD  dutasteride (AVODART) 0.5 MG capsule Take 0.5 mg by mouth daily.    [provider]  latanoprost (XALATAN) 0.005 % ophthalmic solution Place 1 drop into both eyes at bedtime.     [provider]  memantine (NAMENDA) 10 MG tablet Take 20 mg by mouth at bedtime.    [provider]  metoprolol succinate (TOPROL-XL) 25 MG 24 hr tablet Take 0.5 tablets (12.5 mg total) by mouth at bedtime. 05/31/20   Lilland, Alana, DO  Naphazoline HCl (CLEAR EYES OP) Apply 2 drops to eye daily as needed (dry eyes).    [provider]  Nutritional Supplements ([place]) Take 1 Bottle by mouth as needed (appetite, nutrition).    [provider]  tamsulosin (FLOMAX) 0.4 MG CAPS capsule Take 0.4 mg by mouth at bedtime.    [provider]    Physical Exam: Vitals:   05/31/21 0730 05/31/21 1002 05/31/21 1100 05/31/21 1116  BP: 132/90 (!) 114/103 (!) 151/80 (!) 151/80  Pulse: 79 73 65 75  Resp: 16 15 16 14   Temp:      TempSrc:      SpO2: 96% 98% 96% 96%   Weight:    72.6 kg  Height:    5\' 11"  (1.803 m)    General: 80 y.o. male resting in bed in NAD Eyes: PERRL, normal sclera ENMT: Nares patent w/o discharge, orophaynx clear, dentition normal, ears w/o discharge/lesions/ulcers Neck: Supple, trachea midline Cardiovascular: RRR, +S1, S2, no m/g/r, equal pulses throughout Respiratory: CTABL, no w/r/r, normal WOB GI: BS+, NDNT, no masses noted, no organomegaly noted MSK: No e/c/c Neuro: A&O x 3, no focal deficits Psyc: A little  confused, but calm/cooperative  Labs on Admission: I have personally reviewed following labs and imaging studies  CBC: Recent Labs  Lab 05/30/21 1702 05/31/21 1025  WBC 15.3* 10.4  NEUTROABS  --  8.6*  HGB 16.6 16.4  HCT 49.9 48.7  MCV 93.6 93.8  PLT 148* 591   Basic Metabolic Panel: Recent Labs  Lab 05/24/21 1227 05/30/21 1702 05/31/21 1025  NA 138 141 140  K 3.7 3.6 3.4*  CL 108 109 106  CO2 22 24 24   GLUCOSE 87 117* 99  BUN 15 17 25*  CREATININE 0.94 1.38* 1.24  CALCIUM 9.2 9.9 9.4   GFR: Estimated Creatinine Clearance: 48.8 mL/min (by C-G formula based on SCr of 1.24 mg/dL). Liver Function Tests: Recent Labs  Lab 05/24/21 1227 05/31/21 1025  AST 20 19  ALT 18 20  ALKPHOS 60 55  BILITOT 1.0 1.0  PROT 7.2 8.2*  ALBUMIN 3.7 4.1   No results for input(s): LIPASE, AMYLASE in the last 168 hours. No results for input(s): AMMONIA in the last 168 hours. Coagulation Profile: No results for input(s): INR, PROTIME in the last 168 hours. Cardiac Enzymes: No results for input(s): CKTOTAL, CKMB, CKMBINDEX, TROPONINI in the last 168 hours. BNP (last 3 results) No results for input(s): PROBNP in the last 8760 hours. HbA1C: No results for input(s): HGBA1C in the last 72 hours. CBG: No results for input(s): GLUCAP in the last 168 hours. Lipid Profile: No results for input(s): CHOL, HDL, LDLCALC, TRIG, CHOLHDL, LDLDIRECT in the last 72 hours. Thyroid Function Tests: No results for input(s):  TSH, T4TOTAL, FREET4, T3FREE, THYROIDAB in the last 72 hours. Anemia Panel: No results for input(s): VITAMINB12, FOLATE, FERRITIN, TIBC, IRON, RETICCTPCT in the last 72 hours. Urine analysis:    Component Value Date/Time   COLORURINE AMBER (A) 05/31/2021 1035   APPEARANCEUR HAZY (A) 05/31/2021 1035   LABSPEC 1.021 05/31/2021 1035   PHURINE 5.0 05/31/2021 1035   GLUCOSEU NEGATIVE 05/31/2021 1035   GLUCOSEU NEGATIVE 02/17/2011 0930   HGBUR SMALL (A) 05/31/2021 1035   BILIRUBINUR NEGATIVE 05/31/2021 1035   KETONESUR 5 (A) 05/31/2021 1035   PROTEINUR 100 (A) 05/31/2021 1035   UROBILINOGEN 1.0 09/12/2012 1802   NITRITE NEGATIVE 05/31/2021 1035   LEUKOCYTESUR LARGE (A) 05/31/2021 1035    Radiological Exams on Admission: DG Chest Port 1 View  Result Date: 05/31/2021 CLINICAL DATA:  Weakness for the past 2 weeks. Diagnosed with COVID before Christmas. EXAM: PORTABLE CHEST 1 VIEW COMPARISON:  Chest x-ray dated April 01, 2021. FINDINGS: Unchanged left chest wall pacemaker. The heart size and mediastinal contours are within normal limits. Normal pulmonary vascularity. No focal consolidation, pleural effusion, or pneumothorax. Unchanged elevation of the right hemidiaphragm with chronic right basilar atelectasis/scarring. No acute osseous abnormality. IMPRESSION: 1. No active disease. Electronically Signed   By: Titus Dubin M.D.   On: 05/31/2021 10:50    EKG: Independently reviewed. Sinus, no st elevation  Assessment/Plan Acute metabolic encephalopathy Possible UTI     - place in obs, tele     - started on cefepime in ED, can de-escalate to rocephin for now     - follow Ucx     - he is A&O x 3 now, but still a little confused     - will also check stool studies  Diarrhea     - stool studies     - if negative, can add imodium  COVID 19 infection     - recent infection treated w/  paxlovid; no respiratory symptoms, but he does have diarrhea     - he hasn't completed 10-day  isolation; place on precautions  Hypokalemia     - replace K+, check Mg2+  Vascular dementia     - continue home regimen when confirmed  HTN     - continue home regimen when confirmed  HLD     - continue home regimen when confirmed  Gout     - continue home regimen when confirmed  BPH     - continue home regimen when confirmed  DVT prophylaxis: SCDs Code Status: FULL  Family Communication: w/ wife by phone  Consults called: None   Status is: Observation  The patient remains OBS appropriate and will d/c before 2 midnights.  Jonnie Finner DO [facility]  If 7PM-7AM, please contact night-coverage [email or web address]  05/31/2021, 11:50 AM

## 2021-05-31 NOTE — ED Provider Notes (Signed)
[facility] DEPT Provider Note   CSN: [id number] Arrival date & time: 05/30/21  1547     History Chief Complaint  Patient presents with   Weakness    Dustin Hamilton is a 80 y.o. male.  80 year old male with prior medical history as detailed below presents for evaluation.  Patient reports recent COVID infection -he completed course of Paxil bid 4 days prior.  He tested positive with PCR test 7 days ago.  Patient also recently diagnosed with UTI.  He was placed on Keflex for treatment of same.  He has 2 days of Keflex left to complete his course.  Patient reports that he has had several episodes of loose stools over the last week.  He reports decreased p.o. intake.  Patient's wife reports that the patient is known weaker than his normal.  Yesterday he was seen in urgent care and noted to have orthostatic changes on his vitals.  Patient was referred to the ED for evaluation.  Patient's wife contacted by phone for additional history.  She apparently also contracted COVID and is staying at home so that she does not spread the virus.  Patient waited 18 hours in the waiting room for ED evaluation.   The history is provided by the patient, medical records and the spouse.  Weakness Severity:  Moderate Onset quality:  Gradual Duration:  3 days Timing:  Rare Progression:  Waxing and waning Chronicity:  New     Past Medical History:  Diagnosis Date   BPH (benign prostatic hyperplasia)    Cerebrovascular disease    Congenital anomaly of diaphragm    Elevated PSA    Glaucoma, both eyes    Hemorrhoid    Hepatitis B surface antigen positive    02-20-2011   History of adenomatous polyp of colon    2007, 2009 and 2013  tubular adenoma's   History of alcohol abuse    quit 1963   History of cerebral parenchymal hemorrhage    01/ 2006  left occiptial lobe related to hypertensive crisis   History of CVA (cerebrovascular accident)    09-12-2012  left  hippocampus/ amygdala junction and per MRI old white matter infarcts--  per pt residual short- term memory issues   History of fatty infiltration of liver hx visit's at [facility] , last visit 05/ 2014   elvated LFT's ,  via liver bx 2004 related to hx alcohol and drug abuse (quit 1964)   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   HTN (hypertension)    Renal cyst, left    Stroke ([place])    hx of 3 strokes in past    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    Urethral lesion    urethral mass    Patient Active Problem List   Diagnosis Date Noted   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   Fever and chills 04/01/2021   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   Complete heart block ([place])    Heart block    Syncope 05/03/2020   History of stroke 05/03/2020   History of supraventricular tachycardia 05/03/2020   Thrombocytopenia ([place]) 05/03/2020   Acute kidney injury ([place]) 05/03/2020   Memory impairment 05/03/2020   Atypical chest pain 01/27/2020   SVT (supraventricular tachycardia) ([place]) 06/03/2017   Dizziness 08/02/2014   CVA (cerebral infarction) 09/13/2012   Vertigo 09/12/2012   EAR PAIN 09/05/2009   BACK PAIN 06/20/2009  COLONIC POLYPS 05/06/2007   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   NONDEPENDENT ALCOHOL ABUSE IN REMISSION 05/06/2007   Essential hypertension 05/06/2007   Hypertensive heart disease without heart failure 05/06/2007   INTRACRANIAL HEMORRHAGE 05/06/2007   Cerebrovascular disease 05/06/2007   HEMORRHOIDS 05/06/2007   INGUINAL HERNIA, RIGHT 05/06/2007   RENAL CYST 05/06/2007   HEMATURIA UNSPECIFIED 05/06/2007   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   PSA, INCREASED 05/06/2007   LIVER FUNCTION TESTS, ABNORMAL 05/06/2007   Benign prostatic hyperplasia with urinary obstruction 05/06/2007    Past Surgical History:  Procedure Laterality Date   CARDIOVASCULAR STRESS TEST  05/05/2007   normal nuclear  study w/ no ischemia/  normal LV fucntion and wall motion , ef60%   COLONOSCOPY  last one 04-06-2012   CYSTO/  LEFT RETROGRADE PYELOGRAM/ CYTOLOGY WASHINGS/  URETEROSCOPY  03/05/2000   INGUINAL HERNIA REPAIR Bilateral 1965 and 1980's   LAPAROSCOPIC INGUINAL HERNIA WITH UMBILICAL HERNIA Right 56/38/9373   LIVER BIOPSY  1980's and 2004   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   Procedure: PACEMAKER IMPLANT;  Surgeon: Evans Lance, MD;  Location: [facility];  Service: Cardiovascular;  Laterality: N/A;   SVT ABLATION N/A 11/15/2018   Procedure: SVT ABLATION;  Surgeon: Evans Lance, MD;  Location: [facility];  Service: Cardiovascular;  Laterality: N/A;   TRANSTHORACIC ECHOCARDIOGRAM  09/13/2012   moderate LVH,  ef 60-65%/     TRANSURETHRAL RESECTION OF BLADDER TUMOR N/A 08/11/2016   Procedure: TRANSURETHRAL RESECTION OF BLADDER TUMOR (TURBT);  Surgeon: Cleon Gustin, MD;  Location: [facility];  Service: Urology;  Laterality: N/A;       Family History  Problem Relation Age of Onset   Rheum arthritis Mother    Diabetes Mother    Stroke Mother    Heart attack Mother    Kidney failure Mother    Heart attack Father    Heart disease Maternal Grandmother    Rheum arthritis Maternal Grandmother    Diabetes Maternal Grandmother    Stroke Maternal Grandmother    Colon cancer Neg Hx     Social History   Tobacco Use   Smoking status: Former    Packs/day: 1.00    Years: 5.00    Pack years: 5.00    Types: Cigarettes    Quit date: 11/30/1981    Years since quitting: 39.5   Smokeless tobacco: Never  Vaping Use   Vaping Use: Never used  Substance Use Topics   Alcohol use: No    Alcohol/week: 0.0 standard drinks    Comment: hx abuse -- quit:  1963   Drug use: No    Comment: hx abuse -- quit 1964 (iv heroin and cocaine    Home Medications Prior to Admission medications   Medication Sig Start Date End Date Taking? Authorizing Provider  allopurinol  (ZYLOPRIM) 100 MG tablet Take 1 tablet (100 mg total) by mouth daily. 04/05/21 04/05/22  Pokhrel, Corrie Mckusick, MD  amLODipine (NORVASC) 5 MG tablet Take 0.5 tablets (2.5 mg total) by mouth in the morning and at bedtime. 04/05/21 07/04/21  Pokhrel, Corrie Mckusick, MD  atorvastatin (LIPITOR) 40 MG tablet Take 0.5 tablets (20 mg total) by mouth daily. 04/05/21   Pokhrel, Corrie Mckusick, MD  brimonidine (ALPHAGAN) 0.2 % ophthalmic solution Place 1 drop into both eyes 2 (two) times daily.    [provider]  cephALEXin (KEFLEX) 500 MG capsule Take 1 capsule (500 mg total) by mouth 4 (four) times daily. 05/24/21  Milton Ferguson, MD  dutasteride (AVODART) 0.5 MG capsule Take 0.5 mg by mouth daily.    [provider]  latanoprost (XALATAN) 0.005 % ophthalmic solution Place 1 drop into both eyes at bedtime.     [provider]  memantine (NAMENDA) 10 MG tablet Take 20 mg by mouth at bedtime.    [provider]  metoprolol succinate (TOPROL-XL) 25 MG 24 hr tablet Take 0.5 tablets (12.5 mg total) by mouth at bedtime. 05/31/20   Lilland, Alana, DO  Naphazoline HCl (CLEAR EYES OP) Apply 2 drops to eye daily as needed (dry eyes).    [provider]  Nutritional Supplements ([place]) Take 1 Bottle by mouth as needed (appetite, nutrition).    [provider]  tamsulosin (FLOMAX) 0.4 MG CAPS capsule Take 0.4 mg by mouth at bedtime.    [provider]    Allergies    Aricept [donepezil], Penicillins, and Viagra [sildenafil]  Review of Systems   Review of Systems  Neurological:  Positive for weakness.  All other systems reviewed and are negative.  Physical Exam Updated Vital Signs BP (!) 151/80    Pulse 65    Temp 97.9 F (36.6 C) (Oral)    Resp 16    SpO2 96%   Physical Exam Vitals and nursing note reviewed.  Constitutional:      General: He is not in acute distress.    Appearance: Normal appearance. He is well-developed.  HENT:     Head:  Normocephalic and atraumatic.     Mouth/Throat:     Mouth: Mucous membranes are dry.  Eyes:     Conjunctiva/sclera: Conjunctivae normal.     Pupils: Pupils are equal, round, and reactive to light.  Cardiovascular:     Rate and Rhythm: Normal rate and regular rhythm.     Heart sounds: Normal heart sounds.  Pulmonary:     Effort: Pulmonary effort is normal. No respiratory distress.     Breath sounds: Normal breath sounds.  Abdominal:     General: There is no distension.     Palpations: Abdomen is soft.     Tenderness: There is no abdominal tenderness.  Musculoskeletal:        General: No deformity. Normal range of motion.     Cervical back: Normal range of motion and neck supple.  Skin:    General: Skin is warm and dry.  Neurological:     General: No focal deficit present.     Mental Status: He is alert and oriented to person, place, and time.    ED Results / Procedures / Treatments   Labs (all labs ordered are listed, but only abnormal results are displayed) Labs Reviewed  BASIC METABOLIC PANEL - Abnormal; Notable for the following components:      Result Value   Glucose, Bld 117 (*)    Creatinine, Ser 1.38 (*)    GFR, Estimated 52 (*)    All other components within normal limits  CBC - Abnormal; Notable for the following components:   WBC 15.3 (*)    Platelets 148 (*)    All other components within normal limits  URINALYSIS, ROUTINE W REFLEX MICROSCOPIC - Abnormal; Notable for the following components:   Color, Urine AMBER (*)    APPearance HAZY (*)    Hgb urine dipstick SMALL (*)    Ketones, ur 5 (*)    Protein, ur 100 (*)    Leukocytes,Ua LARGE (*)    WBC,  UA >50 (*)    Bacteria, UA RARE (*)    All other components within normal limits  CBC WITH DIFFERENTIAL/PLATELET - Abnormal; Notable for the following components:   Neutro Abs 8.6 (*)    All other components within normal limits  COMPREHENSIVE METABOLIC PANEL - Abnormal; Notable for the following components:    Potassium 3.4 (*)    BUN 25 (*)    Total Protein 8.2 (*)    GFR, Estimated 59 (*)    All other components within normal limits  URINE CULTURE    EKG EKG Interpretation  Date/Time:  Thursday May 30 2021 16:53:01 EST Ventricular Rate:  87 PR Interval:  284 QRS Duration: 117 QT Interval:  372 QTC Calculation: 448 R Axis:   -58 Text Interpretation: Sinus rhythm Prolonged PR interval Probable left atrial enlargement Left anterior fascicular block Anterior infarct, old Confirmed by Dene Gentry [phone]) on 05/31/2021 10:03:08 AM  Radiology DG Chest Port 1 View  Result Date: 05/31/2021 CLINICAL DATA:  Weakness for the past 2 weeks. Diagnosed with COVID before Christmas. EXAM: PORTABLE CHEST 1 VIEW COMPARISON:  Chest x-ray dated April 01, 2021. FINDINGS: Unchanged left chest wall pacemaker. The heart size and mediastinal contours are within normal limits. Normal pulmonary vascularity. No focal consolidation, pleural effusion, or pneumothorax. Unchanged elevation of the right hemidiaphragm with chronic right basilar atelectasis/scarring. No acute osseous abnormality. IMPRESSION: 1. No active disease. Electronically Signed   By: Titus Dubin M.D.   On: 05/31/2021 10:50    Procedures Procedures   Medications Ordered in ED Medications  0.9 %  sodium chloride infusion (has no administration in time range)  sodium chloride 0.9 % bolus 500 mL (500 mLs Intravenous Bolus 05/31/21 1030)    ED Course  I have reviewed the triage vital signs and the nursing notes.  Pertinent labs & imaging results that were available during my care of the patient were reviewed by me and considered in my medical decision making (see chart for details).    MDM Rules/Calculators/A&P                         MDM  MSE complete  ELTON HEID was evaluated in Emergency Department on 05/31/2021 for the symptoms described in the history of present illness. He was evaluated in the context of the  global COVID-19 pandemic, which necessitated consideration that the patient might be at risk for infection with the SARS-CoV-2 virus that causes COVID-19. Institutional protocols and algorithms that pertain to the evaluation of patients at risk for COVID-19 are in a state of rapid change based on information released by regulatory bodies including the CDC and federal and state organizations. These policies and algorithms were followed during the patient's care in the ED.  Patient is presenting with complaint of generalized weakness, fatigue, and lightheadedness with standing.  Patient with recently diagnosed COVID and UTI infections.  Patient completed course of packs of it for treatment of his COVID.  Patient is in the middle of a course of Keflex for treatment of UTI.  Patient's work-up today is suggestive of mild to moderate dehydration with likely concurrent UTI.  Urinary infection does not appear to be improving with outpatient Keflex.  We will plan for admission for additional IV fluids and for initiation of IV antibiotics.  Patient's wife is aware of plan of care (contacted by phone).  She agrees with plan of care.  Hospitalist services aware case and will evaluate for  admission.  They recommend initiation of cefepime for coverage of urine infection.       Final Clinical Impression(s) / ED Diagnoses Final diagnoses:  Weakness  Dehydration  Urinary tract infection without hematuria, site unspecified    Rx / DC Orders ED Discharge Orders     None        Valarie Merino, MD 05/31/21 1139

## 2021-06-01 DIAGNOSIS — Z79899 Other long term (current) drug therapy: Secondary | ICD-10-CM | POA: Diagnosis not present

## 2021-06-01 DIAGNOSIS — E785 Hyperlipidemia, unspecified: Secondary | ICD-10-CM | POA: Diagnosis present

## 2021-06-01 DIAGNOSIS — I10 Essential (primary) hypertension: Secondary | ICD-10-CM | POA: Diagnosis present

## 2021-06-01 DIAGNOSIS — Z833 Family history of diabetes mellitus: Secondary | ICD-10-CM | POA: Diagnosis not present

## 2021-06-01 DIAGNOSIS — N390 Urinary tract infection, site not specified: Secondary | ICD-10-CM | POA: Diagnosis present

## 2021-06-01 DIAGNOSIS — Z8261 Family history of arthritis: Secondary | ICD-10-CM | POA: Diagnosis not present

## 2021-06-01 DIAGNOSIS — Z823 Family history of stroke: Secondary | ICD-10-CM | POA: Diagnosis not present

## 2021-06-01 DIAGNOSIS — Z880 Allergy status to penicillin: Secondary | ICD-10-CM | POA: Diagnosis not present

## 2021-06-01 DIAGNOSIS — F0150 Vascular dementia without behavioral disturbance: Secondary | ICD-10-CM | POA: Diagnosis present

## 2021-06-01 DIAGNOSIS — Z87891 Personal history of nicotine dependence: Secondary | ICD-10-CM | POA: Diagnosis not present

## 2021-06-01 DIAGNOSIS — Z8673 Personal history of transient ischemic attack (TIA), and cerebral infarction without residual deficits: Secondary | ICD-10-CM | POA: Diagnosis not present

## 2021-06-01 DIAGNOSIS — N400 Enlarged prostate without lower urinary tract symptoms: Secondary | ICD-10-CM | POA: Diagnosis present

## 2021-06-01 DIAGNOSIS — E860 Dehydration: Secondary | ICD-10-CM | POA: Diagnosis present

## 2021-06-01 DIAGNOSIS — Z8249 Family history of ischemic heart disease and other diseases of the circulatory system: Secondary | ICD-10-CM | POA: Diagnosis not present

## 2021-06-01 DIAGNOSIS — A0472 Enterocolitis due to Clostridium difficile, not specified as recurrent: Secondary | ICD-10-CM | POA: Diagnosis present

## 2021-06-01 DIAGNOSIS — Z888 Allergy status to other drugs, medicaments and biological substances status: Secondary | ICD-10-CM | POA: Diagnosis not present

## 2021-06-01 DIAGNOSIS — U071 COVID-19: Secondary | ICD-10-CM | POA: Diagnosis present

## 2021-06-01 DIAGNOSIS — M109 Gout, unspecified: Secondary | ICD-10-CM | POA: Diagnosis present

## 2021-06-01 DIAGNOSIS — E876 Hypokalemia: Secondary | ICD-10-CM | POA: Diagnosis present

## 2021-06-01 DIAGNOSIS — G9341 Metabolic encephalopathy: Secondary | ICD-10-CM | POA: Diagnosis present

## 2021-06-01 DIAGNOSIS — Z841 Family history of disorders of kidney and ureter: Secondary | ICD-10-CM | POA: Diagnosis not present

## 2021-06-01 LAB — COMPREHENSIVE METABOLIC PANEL
ALT: 16 U/L (ref 0–44)
AST: 17 U/L (ref 15–41)
Albumin: 3.3 g/dL — ABNORMAL LOW (ref 3.5–5.0)
Alkaline Phosphatase: 50 U/L (ref 38–126)
Anion gap: 8 (ref 5–15)
BUN: 21 mg/dL (ref 8–23)
CO2: 25 mmol/L (ref 22–32)
Calcium: 9 mg/dL (ref 8.9–10.3)
Chloride: 107 mmol/L (ref 98–111)
Creatinine, Ser: 1.1 mg/dL (ref 0.61–1.24)
GFR, Estimated: >60 mL/min (ref >60)
Glucose, Bld: 86 mg/dL (ref 70–99)
Potassium: 3.4 mmol/L — ABNORMAL LOW (ref 3.5–5.1)
Sodium: 140 mmol/L (ref 135–145)
Total Bilirubin: 0.7 mg/dL (ref 0.3–1.2)
Total Protein: 6.6 g/dL (ref 6.5–8.1)

## 2021-06-01 LAB — GASTROINTESTINAL PANEL BY PCR, STOOL (REPLACES STOOL CULTURE)

## 2021-06-01 LAB — URINE CULTURE: Culture: NO GROWTH

## 2021-06-01 LAB — CBC
HCT: 44.6 % (ref 39.0–52.0)
Hemoglobin: 15.1 g/dL (ref 13.0–17.0)
MCH: 32.3 pg (ref 26.0–34.0)
MCHC: 33.9 g/dL (ref 30.0–36.0)
MCV: 95.3 fL (ref 80.0–100.0)
Platelets: 141 10*3/uL — ABNORMAL LOW (ref 150–400)
RBC: 4.68 MIL/uL (ref 4.22–5.81)
RDW: 14.7 % (ref 11.5–15.5)
WBC: 8.1 10*3/uL (ref 4.0–10.5)
nRBC: 0 % (ref 0.0–0.2)

## 2021-06-01 LAB — GLUCOSE, CAPILLARY: Glucose-Capillary: 96 mg/dL (ref 70–99)

## 2021-06-01 MED ORDER — SODIUM CHLORIDE 0.9 % IV SOLN
1.0000 g | INTRAVENOUS | Status: DC
  Administered 2021-06-01: 1 g via INTRAVENOUS
  Filled 2021-06-01: qty 10

## 2021-06-01 MED ORDER — ATORVASTATIN CALCIUM 10 MG PO TABS
20.0000 mg | ORAL_TABLET | Freq: Every day | ORAL | Status: DC
  Administered 2021-06-01 – 2021-06-02 (×2): 20 mg via ORAL
  Filled 2021-06-01 (×2): qty 2

## 2021-06-01 MED ORDER — POTASSIUM CHLORIDE 2 MEQ/ML IV SOLN
INTRAVENOUS | Status: DC
  Filled 2021-06-01 (×3): qty 1000

## 2021-06-01 MED ORDER — BRIMONIDINE TARTRATE 0.2 % OP SOLN
1.0000 [drp] | Freq: Two times a day (BID) | OPHTHALMIC | Status: DC
  Administered 2021-06-01 – 2021-06-02 (×2): 1 [drp] via OPHTHALMIC
  Filled 2021-06-01: qty 5

## 2021-06-01 MED ORDER — ALLOPURINOL 100 MG PO TABS
100.0000 mg | ORAL_TABLET | Freq: Every day | ORAL | Status: DC
  Administered 2021-06-01 – 2021-06-02 (×2): 100 mg via ORAL
  Filled 2021-06-01 (×2): qty 1

## 2021-06-01 MED ORDER — SACCHAROMYCES BOULARDII 250 MG PO CAPS
250.0000 mg | ORAL_CAPSULE | Freq: Two times a day (BID) | ORAL | Status: DC
  Administered 2021-06-01 – 2021-06-02 (×3): 250 mg via ORAL
  Filled 2021-06-01 (×3): qty 1

## 2021-06-01 MED ORDER — METOPROLOL SUCCINATE ER 25 MG PO TB24
12.5000 mg | ORAL_TABLET | Freq: Every day | ORAL | Status: DC
  Administered 2021-06-01: 12.5 mg via ORAL
  Filled 2021-06-01: qty 1

## 2021-06-01 MED ORDER — LATANOPROST 0.005 % OP SOLN
1.0000 [drp] | Freq: Every day | OPHTHALMIC | Status: DC
  Administered 2021-06-01: 1 [drp] via OPHTHALMIC
  Filled 2021-06-01: qty 2.5

## 2021-06-01 MED ORDER — ENSURE ACTIVE HEART HEALTH PO LIQD: ORAL | Status: DC | PRN

## 2021-06-01 MED ORDER — MEMANTINE HCL 10 MG PO TABS
20.0000 mg | ORAL_TABLET | Freq: Every day | ORAL | Status: DC
  Administered 2021-06-01: 20 mg via ORAL
  Filled 2021-06-01: qty 2

## 2021-06-01 MED ORDER — ENSURE ENLIVE PO LIQD
237.0000 mL | Freq: Every day | ORAL | Status: DC
  Administered 2021-06-02: 237 mL via ORAL

## 2021-06-01 MED ORDER — DUTASTERIDE 0.5 MG PO CAPS
0.5000 mg | ORAL_CAPSULE | Freq: Every day | ORAL | Status: DC
  Administered 2021-06-01 – 2021-06-02 (×2): 0.5 mg via ORAL
  Filled 2021-06-01 (×2): qty 1

## 2021-06-01 MED ORDER — TAMSULOSIN HCL 0.4 MG PO CAPS
0.4000 mg | ORAL_CAPSULE | Freq: Every day | ORAL | Status: DC
  Administered 2021-06-01: 0.4 mg via ORAL
  Filled 2021-06-01: qty 1

## 2021-06-01 NOTE — Progress Notes (Addendum)
PROGRESS NOTE  Dustin Hamilton HAL:[id number] DOB: [birth year] DOA: 05/30/2021 PCP: [facility]  HPI/Recap of past 24 hours: Dustin Hamilton is a 80 y.o. male with medical history significant of HTN, HLD, BPH, gout, glaucoma. Presenting with diarrhea and lethargy. He was diagnosed with COVID and a UTI about a week ago. He was given a prescription for paxlovid and keflex. He completed that paxlovid and he had a couple of doses left of the keflex. He was initially feeling better, but over the last 24 - 36 hours, he had several runs of diarrhea and has been more lethargic.  His change concerned his wife, so she had him brought to the ED for further evaluation.  Work-up revealed UA positive for pyuria and stool positive for C. difficile infection.  Patient was started on Rocephin and p.o. vancomycin.  Florastor 250 mg twice daily was added.  06/01/2021: Patient was seen and examined at his bedside.  He is alert and oriented x2.  He denies having any abdominal pain or nausea.  Endorses diarrhea last night and early this morning.  Assessment/Plan: Principal Problem:   Acute metabolic encephalopathy  Acute metabolic encephalopathy, likely multifactorial secondary to COVID-19 viral infection, C. difficile infection, dehydration from diarrhea. Continue to treat underlying conditions.   COVID-19 screening test positive on 05/24/2021 and 05/31/2021. C. difficile PCR positive on 05/31/2021. Airborne/enteric precautions, continue. Reorient as needed Fall/aspiration/delirium precautions. Regulate sleep and awake cycle Melatonin nightly.  Newly diagnosed C. difficile colitis. Presented with frequent bouts of diarrhea C. difficile PCR positive on 05/31/2021. Started on p.o. vancomycin 125 mg 4 times daily x10 days Added Florastor twice daily Added judicious IV fluid hydration LR KCl 40 mEq at 50 cc/h x 2 days to avoid dehydration. Replete electrolytes as indicated.  Generalized  weakness, multifactorial secondary to above PT OT assessment Fall precautions  Hypokalemia, likely from GI losses with diarrhea Serum potassium 3.4. Repleted intravenously Continue to replete as indicated Magnesium 1.9  BPH Resume home regimen  Essential hypertension Resume home regimen Continue to closely monitor vital signs  Gout Resume home regimen.  Hyperlipidemia Resume home regimen.  Recently treated UTI, POA UA positive for pyuria Urine culture no growth. Will DC Rocephin on 06/01/2021, he received 1 dose.   Code Status: Full code  Family Communication: None at bedside  Disposition Plan: Likely will discharge to home with home health services on 06/03/2021.   Consultants: None  Procedures: None  Antimicrobials: Rocephin  DVT prophylaxis: Subcu Lovenox daily  Status is: Observation        Objective: Vitals:   06/01/21 0139 06/01/21 0517 06/01/21 0953 06/01/21 1220  BP: 100/85 100/73 127/86 128/87  Pulse: 92 88 67 80  Resp: 20 20 18 17   Temp: 97.7 F (36.5 C) 97.8 F (36.6 C) 97.6 F (36.4 C) 97.7 F (36.5 C)  TempSrc: Oral Oral Oral Oral  SpO2: 97% 96% 97% 97%  Weight:      Height:        Intake/Output Summary (Last 24 hours) at 06/01/2021 1408 Last data filed at 06/01/2021 1215 Gross per 24 hour  Intake 765.07 ml  Output 400 ml  Net 365.07 ml   Filed Weights   05/31/21 1116  Weight: 72.6 kg    Exam:  General: 80 y.o. year-old male well developed well nourished in no acute distress.  Alert and oriented x2. Cardiovascular: Regular rate and rhythm with no rubs or gallops.  No thyromegaly or JVD noted.   Respiratory: Clear  to auscultation with no wheezes or rales. Good inspiratory effort. Abdomen: Soft nontender nondistended with normal bowel sounds x4 quadrants. Musculoskeletal: No lower extremity edema. 2/4 pulses in all 4 extremities. Skin: No ulcerative lesions noted or rashes, Psychiatry: Mood is appropriate for  condition and setting   Data Reviewed: CBC: Recent Labs  Lab 05/30/21 1702 05/31/21 1025 06/01/21 0606  WBC 15.3* 10.4 8.1  NEUTROABS  --  8.6*  --   HGB 16.6 16.4 15.1  HCT 49.9 48.7 44.6  MCV 93.6 93.8 95.3  PLT 148* 156 102*   Basic Metabolic Panel: Recent Labs  Lab 05/30/21 1702 05/31/21 1025 05/31/21 2214 06/01/21 0606  NA 141 140  --  140  K 3.6 3.4*  --  3.4*  CL 109 106  --  107  CO2 24 24  --  25  GLUCOSE 117* 99  --  86  BUN 17 25*  --  21  CREATININE 1.38* 1.24  --  1.10  CALCIUM 9.9 9.4  --  9.0  MG  --   --  1.9  --    GFR: Estimated Creatinine Clearance: 55 mL/min (by C-G formula based on SCr of 1.1 mg/dL). Liver Function Tests: Recent Labs  Lab 05/31/21 1025 06/01/21 0606  AST 19 17  ALT 20 16  ALKPHOS 55 50  BILITOT 1.0 0.7  PROT 8.2* 6.6  ALBUMIN 4.1 3.3*   No results for input(s): LIPASE, AMYLASE in the last 168 hours. No results for input(s): AMMONIA in the last 168 hours. Coagulation Profile: No results for input(s): INR, PROTIME in the last 168 hours. Cardiac Enzymes: No results for input(s): CKTOTAL, CKMB, CKMBINDEX, TROPONINI in the last 168 hours. BNP (last 3 results) No results for input(s): PROBNP in the last 8760 hours. HbA1C: No results for input(s): HGBA1C in the last 72 hours. CBG: Recent Labs  Lab 06/01/21 1217  GLUCAP 96   Lipid Profile: No results for input(s): CHOL, HDL, LDLCALC, TRIG, CHOLHDL, LDLDIRECT in the last 72 hours. Thyroid Function Tests: No results for input(s): TSH, T4TOTAL, FREET4, T3FREE, THYROIDAB in the last 72 hours. Anemia Panel: No results for input(s): VITAMINB12, FOLATE, FERRITIN, TIBC, IRON, RETICCTPCT in the last 72 hours. Urine analysis:    Component Value Date/Time   COLORURINE AMBER (A) 05/31/2021 1035   APPEARANCEUR HAZY (A) 05/31/2021 1035   LABSPEC 1.021 05/31/2021 1035   PHURINE 5.0 05/31/2021 1035   GLUCOSEU NEGATIVE 05/31/2021 1035   GLUCOSEU NEGATIVE 02/17/2011 0930    HGBUR SMALL (A) 05/31/2021 1035   BILIRUBINUR NEGATIVE 05/31/2021 1035   KETONESUR 5 (A) 05/31/2021 1035   PROTEINUR 100 (A) 05/31/2021 1035   UROBILINOGEN 1.0 09/12/2012 1802   NITRITE NEGATIVE 05/31/2021 1035   LEUKOCYTESUR LARGE (A) 05/31/2021 1035   Sepsis Labs: @LABRCNTIP (procalcitonin:4,lacticidven:4)  ) Recent Results (from the past 240 hour(s))  Resp Panel by RT-PCR (Flu A&B, Covid) Nasopharyngeal Swab     Status: Abnormal   Collection Time: 05/24/21 11:20 AM   Specimen: Nasopharyngeal Swab; Nasopharyngeal(NP) swabs in vial transport medium  Result Value Ref Range Status   SARS Coronavirus 2 by RT PCR POSITIVE (A) NEGATIVE Final    Comment: (NOTE) SARS-CoV-2 target nucleic acids are DETECTED.  The SARS-CoV-2 RNA is generally detectable in upper respiratory specimens during the acute phase of infection. Positive results are indicative of the presence of the identified virus, but do not rule out bacterial infection or co-infection with other pathogens not detected by the test. Clinical correlation with patient history  and other diagnostic information is necessary to determine patient infection status. The expected result is Negative.  Fact Sheet for Patients: EntrepreneurPulse.com.au  Fact Sheet for Healthcare Providers: IncredibleEmployment.be  This test is not yet approved or cleared by the [place] FDA and  has been authorized for detection and/or diagnosis of SARS-CoV-2 by FDA under an Emergency Use Authorization (EUA).  This EUA will remain in effect (meaning this test can be used) for the duration of  the COVID-19 declaration under Section 564(b)(1) of the A ct, 21 U.S.C. section 360bbb-3(b)(1), unless the authorization is terminated or revoked sooner.     Influenza A by PCR NEGATIVE NEGATIVE Final   Influenza B by PCR NEGATIVE NEGATIVE Final    Comment: (NOTE) The Xpert Xpress SARS-CoV-2/FLU/RSV plus assay is intended  as an aid in the diagnosis of influenza from Nasopharyngeal swab specimens and should not be used as a sole basis for treatment. Nasal washings and aspirates are unacceptable for Xpert Xpress SARS-CoV-2/FLU/RSV testing.  Fact Sheet for Patients: EntrepreneurPulse.com.au  Fact Sheet for Healthcare Providers: IncredibleEmployment.be  This test is not yet approved or cleared by the [place] FDA and has been authorized for detection and/or diagnosis of SARS-CoV-2 by FDA under an Emergency Use Authorization (EUA). This EUA will remain in effect (meaning this test can be used) for the duration of the COVID-19 declaration under Section 564(b)(1) of the Act, 21 U.S.C. section 360bbb-3(b)(1), unless the authorization is terminated or revoked.  Performed at [facility], [place] [address] [place], [place] [address]   Urine Culture     Status: Abnormal   Collection Time: 05/24/21  3:00 PM   Specimen: Urine, Clean Catch  Result Value Ref Range Status   Specimen Description   Final    URINE, CLEAN CATCH Performed at [facility], [place] [address], [place], [place] [address]    Special Requests   Final    NONE Performed at [facility], [place] [address] [place], [place] [address]    Culture (A)  Final    <10,000 COLONIES/mL INSIGNIFICANT GROWTH Performed at [place] [address], [place], [place] [address]    Report Status 05/25/2021 FINAL  Final  Urine Culture     Status: None   Collection Time: 05/31/21 10:25 AM   Specimen: Urine, Clean Catch  Result Value Ref Range Status   Specimen Description   Final    URINE, CLEAN CATCH Performed at [facility], [place] [address] [place], [place] [address]    Special Requests   Final    NONE Performed at [facility], [place] [address] [place], [place] [address]    Culture   Final    NO  GROWTH Performed at [facility], [place] [address], [place], [place] [address]    Report Status 06/01/2021 FINAL  Final  C Difficile Quick Screen w PCR reflex     Status: Abnormal   Collection Time: 05/31/21  2:22 PM   Specimen: STOOL  Result Value Ref Range Status   C Diff antigen POSITIVE (A) NEGATIVE Final   C Diff toxin POSITIVE (A) NEGATIVE Final   C Diff interpretation Toxin producing C. difficile detected.  Final    Comment: CRITICAL RESULT CALLED TO, READ BACK BY AND VERIFIED WITH: CONRAD,Q. 05/31/21 @1900  BY SEEL,M. Performed at [facility], [place] [address] [place],  [address]   Resp Panel by RT-PCR (Flu A&B, Covid) Nasopharyngeal  Swab     Status: Abnormal   Collection Time: 05/31/21  5:24 PM   Specimen: Nasopharyngeal Swab; Nasopharyngeal(NP) swabs in vial transport medium  Result Value Ref Range Status   SARS Coronavirus 2 by RT PCR POSITIVE (A) NEGATIVE Final    Comment: (NOTE) SARS-CoV-2 target nucleic acids are DETECTED.  The SARS-CoV-2 RNA is generally detectable in upper respiratory specimens during the acute phase of infection. Positive results are indicative of the presence of the identified virus, but do not rule out bacterial infection or co-infection with other pathogens not detected by the test. Clinical correlation with patient history and other diagnostic information is necessary to determine patient infection status. The expected result is Negative.  Fact Sheet for Patients: EntrepreneurPulse.com.au  Fact Sheet for Healthcare Providers: IncredibleEmployment.be  This test is not yet approved or cleared by the [place] FDA and  has been authorized for detection and/or diagnosis of SARS-CoV-2 by FDA under an Emergency Use Authorization (EUA).  This EUA will remain in effect (meaning this test can be used) for the duration of  the COVID-19 declaration under Section 564(b)(1) of the A  ct, 21 U.S.C. section 360bbb-3(b)(1), unless the authorization is terminated or revoked sooner.     Influenza A by PCR NEGATIVE NEGATIVE Final   Influenza B by PCR NEGATIVE NEGATIVE Final    Comment: (NOTE) The Xpert Xpress SARS-CoV-2/FLU/RSV plus assay is intended as an aid in the diagnosis of influenza from Nasopharyngeal swab specimens and should not be used as a sole basis for treatment. Nasal washings and aspirates are unacceptable for Xpert Xpress SARS-CoV-2/FLU/RSV testing.  Fact Sheet for Patients: EntrepreneurPulse.com.au  Fact Sheet for Healthcare Providers: IncredibleEmployment.be  This test is not yet approved or cleared by the [place] FDA and has been authorized for detection and/or diagnosis of SARS-CoV-2 by FDA under an Emergency Use Authorization (EUA). This EUA will remain in effect (meaning this test can be used) for the duration of the COVID-19 declaration under Section 564(b)(1) of the Act, 21 U.S.C. section 360bbb-3(b)(1), unless the authorization is terminated or revoked.  Performed at [facility], [place] [address] [place],  [address]       Studies: No results found.  Scheduled Meds:  feeding supplement  237 mL Oral BID BM   potassium chloride  40 mEq Oral Daily   saccharomyces boulardii  250 mg Oral BID   vancomycin  125 mg Oral QID    Continuous Infusions:  cefTRIAXone (ROCEPHIN)  IV 1 g (06/01/21 0957)   lactated ringers with kcl 50 mL/hr at 06/01/21 1002     LOS: 0 days     Kayleen Memos, MD [facility] Pager [phone]  If 7PM-7AM, please contact night-coverage [email or web address] Password [email or web address] 06/01/2021, 2:08 PM

## 2021-06-01 NOTE — Plan of Care (Signed)

## 2021-06-01 NOTE — Plan of Care (Signed)
Pt aox4 pleasant and cooperative with staff.  +COVID +CDIFF PO Vanco, IV Fluids per orders.  Full liquid diet, tolerating well.   Problem: Education: Goal: Knowledge of General Education information will improve Description: Including pain rating scale, medication(s)/side effects and non-pharmacologic comfort measures Outcome: Progressing   Problem: Health Behavior/Discharge Planning: Goal: Ability to manage health-related needs will improve Outcome: Progressing   Problem: Clinical Measurements: Goal: Ability to maintain clinical measurements within normal limits will improve Outcome: Progressing Goal: Will remain free from infection Outcome: Progressing Goal: Diagnostic test results will improve Outcome: Progressing Goal: Respiratory complications will improve Outcome: Progressing Goal: Cardiovascular complication will be avoided Outcome: Progressing   Problem: Activity: Goal: Risk for activity intolerance will decrease Outcome: Progressing   Problem: Nutrition: Goal: Adequate nutrition will be maintained Outcome: Progressing   Problem: Coping: Goal: Level of anxiety will decrease Outcome: Progressing   Problem: Elimination: Goal: Will not experience complications related to bowel motility Outcome: Progressing Goal: Will not experience complications related to urinary retention Outcome: Progressing   Problem: Pain Managment: Goal: General experience of comfort will improve Outcome: Progressing   Problem: Safety: Goal: Ability to remain free from injury will improve Outcome: Progressing   Problem: Skin Integrity: Goal: Risk for impaired skin integrity will decrease Outcome: Progressing

## 2021-06-01 NOTE — Progress Notes (Signed)
The patient is injury-free, afebrile, alert, and oriented X 3. BP was soft and improved after a NS bolus, the rest of vital signs were within the baseline during this shift. He had one episode of diarrhea. Pt denies chest pain, SOB, nausea, vomiting, dizziness, signs or symptoms of bleeding.  or acute changes during this shift. We will continue to monitor and work toward achieving the care plan goals.

## 2021-06-02 LAB — BASIC METABOLIC PANEL
Anion gap: 3 — ABNORMAL LOW (ref 5–15)
BUN: 12 mg/dL (ref 8–23)
CO2: 27 mmol/L (ref 22–32)
Calcium: 9.1 mg/dL (ref 8.9–10.3)
Chloride: 109 mmol/L (ref 98–111)
Creatinine, Ser: 0.8 mg/dL (ref 0.61–1.24)
GFR, Estimated: >60 mL/min (ref >60)
Glucose, Bld: 87 mg/dL (ref 70–99)
Potassium: 4.1 mmol/L (ref 3.5–5.1)
Sodium: 139 mmol/L (ref 135–145)

## 2021-06-02 MED ORDER — VANCOMYCIN HCL 125 MG PO CAPS: 125.0000 mg | ORAL_CAPSULE | Freq: Four times a day (QID) | ORAL | 0 refills | Status: AC

## 2021-06-02 MED ORDER — SACCHAROMYCES BOULARDII 250 MG PO CAPS
250.0000 mg | ORAL_CAPSULE | Freq: Two times a day (BID) | ORAL | 0 refills | Status: DC
Start: 2021-06-02 — End: 2021-06-26

## 2021-06-02 NOTE — Progress Notes (Signed)
Discharge order in place.  Discharge instructions given to patient and son.  Education emphasized on taking medications as prescribed, as well as keeping self well hydrated. Encouraged to call the doctor for concerns.  Both verbalized understanding.  Needs addressed.  Discharged home.

## 2021-06-02 NOTE — Evaluation (Signed)
Physical Therapy Evaluation Patient Details Name: Dustin Hamilton MRN: [id number] DOB: 1940/08/07 Today's Date: 06/02/2021  History of Present Illness  Dustin Hamilton is a 81 y.o. male with medical history significant of HTN, HLD, BPH, gout, glaucoma. Presenting with diarrhea and lethargy. He was diagnosed with COVID and a UTI about a week ago.  Work-up this time revealed UA positive for pyuria and stool positive for C. difficile infection.  Clinical Impression  Pt admitted as above and currently demonstrates ability to perform basic mobility tasks unassisted and ambulating in hall with RW and supervision level assist.  Pt states he feels he is much improved since admit and is close to baseline.  Pt eager for dc home.     Recommendations for follow up therapy are one component of a multi-disciplinary discharge planning process, led by the attending physician.  Recommendations may be updated based on patient status, additional functional criteria and insurance authorization.  Follow Up Recommendations No PT follow up    Assistance Recommended at Discharge PRN  Functional Status Assessment Patient has had a recent decline in their functional status and demonstrates the ability to make significant improvements in function in a reasonable and predictable amount of time.  Equipment Recommendations  None recommended by PT    Recommendations for Other Services       Precautions / Restrictions Precautions Precautions: None Restrictions Weight Bearing Restrictions: No      Mobility  Bed Mobility Overal bed mobility: Modified Independent                  Transfers Overall transfer level: Modified independent Equipment used: Rolling walker (2 wheels)                    Ambulation/Gait Ambulation/Gait assistance: Min guard;Supervision Gait Distance (Feet): 250 Feet Assistive device: Rolling walker (2 wheels) Gait Pattern/deviations: Step-through pattern;Decreased  step length - right;Decreased step length - left;Shuffle;Trunk flexed       General Gait Details: min cues for posture and position from [employer]            Wheelchair Mobility    Modified Rankin (Stroke Patients Only)       Balance Overall balance assessment: Mild deficits observed, not formally tested                                           Pertinent Vitals/Pain Pain Assessment: No/denies pain    Home Living Family/patient expects to be discharged to:: Private residence Living Arrangements: Spouse/significant other Available Help at Discharge: Family;Available 24 hours/day Type of Home: House Home Access: Ramped entrance     Alternate Level Stairs-Number of Steps: stair lift Home Layout: Two level;Full bath on main level;Bed/bath upstairs;Able to live on main level with bedroom/bathroom Home Equipment: Shower seat;Rolling Walker (2 wheels);Rollator (4 wheels);Cane - single point;Wheelchair - manual      Prior Function Prior Level of Function : Independent/Modified Independent             Mobility Comments: Using RW for ambulation 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, independent with ADLs     Hand Dominance   Dominant Hand: Right    Extremity/Trunk Assessment   Upper Extremity Assessment Upper Extremity Assessment: Defer to OT evaluation    Lower Extremity Assessment Lower Extremity Assessment: Overall WFL for tasks assessed    Cervical / Trunk  Assessment Cervical / Trunk Assessment: Normal  Communication   Communication: No difficulties  Cognition Arousal/Alertness: Awake/alert Behavior During Therapy: WFL for tasks assessed/performed Overall Cognitive Status: Within Functional Limits for tasks assessed                                          General Comments      Exercises     Assessment/Plan    PT Assessment Patient does not need any further PT services  PT Problem List         PT  Treatment Interventions      PT Goals (Current goals can be found in the Care Plan section)  Acute Rehab PT Goals Patient Stated Goal: HOME PT Goal Formulation: All assessment and education complete, DC therapy    Frequency     Barriers to discharge        Co-evaluation               AM-PAC PT "6 Clicks" Mobility  Outcome Measure Help needed turning from your back to your side while in a flat bed without using bedrails?: None Help needed moving from lying on your back to sitting on the side of a flat bed without using bedrails?: None Help needed moving to and from a bed to a chair (including a wheelchair)?: None Help needed standing up from a chair using your arms (e.g., wheelchair or bedside chair)?: None Help needed to walk in hospital room?: A Little Help needed climbing 3-5 steps with a railing? : A Little 6 Click Score: 22    End of Session Equipment Utilized During Treatment: Gait belt Activity Tolerance: Patient tolerated treatment well Patient left: in chair;with call bell/phone within reach;with chair alarm set Nurse Communication: Mobility status PT Visit Diagnosis: Difficulty in walking, not elsewhere classified (R26.2)    Time: 2122-4825 PT Time Calculation (min) (ACUTE ONLY): 30 min   Charges:   PT Evaluation $PT Eval Low Complexity: 1 Low          [facility] Pager [phone] Office [phone]   Saoirse Legere 06/02/2021, 4:16 PM

## 2021-06-02 NOTE — Evaluation (Signed)
Occupational Therapy Evaluation Patient Details Name: Dustin Hamilton MRN: [id number] DOB: 1940-07-28 Today's Date: 06/02/2021   History of Present Illness Dustin Hamilton is a 81 y.o. male with medical history significant of HTN, HLD, BPH, gout, glaucoma. Presenting with diarrhea and lethargy. He was diagnosed with COVID and a UTI about a week ago.  Work-up this time revealed UA positive for pyuria and stool positive for C. difficile infection.   Clinical Impression   Dustin Hamilton is an 81 year old who demonstrates modified independence with bed mobility, functional mobility and ADLs. Patient exhibits good upper body strength and activity tolerance. Patient has improved to near baseline. No OT needs at this time.      Recommendations for follow up therapy are one component of a multi-disciplinary discharge planning process, led by the attending physician.  Recommendations may be updated based on patient status, additional functional criteria and insurance authorization.   Follow Up Recommendations  No OT follow up    Assistance Recommended at Discharge None  Functional Status Assessment  Patient has not had a recent decline in their functional status  Equipment Recommendations  None recommended by OT    Recommendations for Other Services       Precautions / Restrictions Precautions Precautions: None Restrictions Weight Bearing Restrictions: No      Mobility Bed Mobility Overal bed mobility: Modified Independent                  Transfers Overall transfer level: Modified independent Equipment used: Rolling walker (2 wheels)                      Balance Overall balance assessment: Mild deficits observed, not formally tested                                         ADL either performed or assessed with clinical judgement   ADL Overall ADL's : Modified independent                                        General ADL Comments: Use of RW with ADLs but otherwise no physical assistance required.     Vision Baseline Vision/History: 3 Glaucoma Patient Visual Report: No change from baseline       Perception     Praxis      Pertinent Vitals/Pain Pain Assessment: No/denies pain     Hand Dominance Right   Extremity/Trunk Assessment Upper Extremity Assessment Upper Extremity Assessment: RUE deficits/detail;LUE deficits/detail RUE Deficits / Details: WFL ROM, 5/5 strength RUE Sensation: WNL RUE Coordination: WNL LUE Deficits / Details: WFL ROM, 5/5 strength LUE Sensation: WNL LUE Coordination: WNL   Lower Extremity Assessment Lower Extremity Assessment: Defer to PT evaluation   Cervical / Trunk Assessment Cervical / Trunk Assessment: Normal   Communication Communication Communication: No difficulties   Cognition Arousal/Alertness: Awake/alert Behavior During Therapy: WFL for tasks assessed/performed Overall Cognitive Status: Within Functional Limits for tasks assessed                                       General Comments       Exercises     Shoulder Instructions  Home Living Family/patient expects to be discharged to:: Private residence Living Arrangements: Spouse/significant other Available Help at Discharge: Family;Available 24 hours/day Type of Home: House Home Access: Ramped entrance     Home Layout: Two level;Full bath on main level;Bed/bath upstairs;Able to live on main level with bedroom/bathroom Alternate Level Stairs-Number of Steps: stair lift   Bathroom Shower/Tub: Walk-in shower;Door   [employer] Toilet: Standard Bathroom Accessibility: Yes How Accessible: Accessible via walker Home Equipment: Advice worker (2 wheels);Rollator (4 wheels);Cane - single point;Wheelchair - manual          Prior Functioning/Environment Prior Level of Function : Independent/Modified Independent             Mobility Comments:  Using RW for ambulation 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, independent with ADLs        OT Problem List:        OT Treatment/Interventions:      OT Goals(Current goals can be found in the care plan section) Acute Rehab OT Goals OT Goal Formulation: All assessment and education complete, DC therapy  OT Frequency:     Barriers to D/C:            Co-evaluation PT/OT/SLP Co-Evaluation/Treatment: Yes (co-eval)            AM-PAC OT "6 Clicks" Daily Activity     Outcome Measure Help from another person eating meals?: None Help from another person taking care of personal grooming?: None Help from another person toileting, which includes using toliet, bedpan, or urinal?: None Help from another person bathing (including washing, rinsing, drying)?: None Help from another person to put on and taking off regular upper body clothing?: None Help from another person to put on and taking off regular lower body clothing?: None 6 Click Score: 24   End of Session Equipment Utilized During Treatment: Rolling walker (2 wheels);Gait belt Nurse Communication: Mobility status  Activity Tolerance: Patient tolerated treatment well Patient left: in chair;with call bell/phone within reach;with chair alarm set  OT Visit Diagnosis: Muscle weakness (generalized) (M62.81)                Time: 4010-2725 OT Time Calculation (min): 22 min Charges:  OT General Charges $OT Visit: 1 Visit OT Evaluation $OT Eval Low Complexity: 1 Low  Antinette Keough, OTR/L [place]  Office [phone] Pager: [phone]   Lenward Chancellor 06/02/2021, 10:29 AM

## 2021-06-02 NOTE — Discharge Summary (Signed)
Discharge Summary  Dustin Hamilton YNW:[id number] DOB: 02-22-1941  PCP: [facility]  Admit date: 05/30/2021 Discharge date: 06/02/2021  Time spent: 35 minutes.  Recommendations for Outpatient Follow-up:  Follow-up with your primary care provider within a week. Take your medications as prescribed.  Discharge Diagnoses:  Active Hospital Problems   Diagnosis Date Noted   Acute metabolic encephalopathy 65/78/4696    Resolved Hospital Problems  No resolved problems to display.    Discharge Condition: Stable  Diet recommendation: Resume previous diet  Vitals:   06/01/21 2110 06/02/21 0459  BP: 130/85 110/85  Pulse: 70 64  Resp: 17 17  Temp: 97.7 F (36.5 C) 98.3 F (36.8 C)  SpO2: 97% 96%    History of present illness:  Dustin Hamilton is a 81 y.o. male with medical history significant of HTN, HLD, BPH, gout, glaucoma. Presenting with diarrhea and lethargy. He was diagnosed with COVID and a UTI about a week ago. He was given a prescription for paxlovid and keflex. He completed that paxlovid and he had a couple of doses left of the keflex. He was initially feeling better, but over the last 24 - 36 hours, he had several runs of diarrhea and has been more lethargic.  His change concerned his wife, so she had him brought to the ED for further evaluation.  Work-up revealed UA positive for pyuria and stool positive for C. difficile infection.  Patient was started on Rocephin and p.o. vancomycin.  Florastor 250 mg twice daily was added.   06/02/2021: Seen and examined at his bedside.  There were no acute events overnight.  He is alert and oriented x4.  He has no new complaints.  Denies nausea or vomiting.  Diarrhea has resolved he had 1 bowel movement this morning and was mushy.  No abdominal pain.  Hospital Course:  Principal Problem:   Acute metabolic encephalopathy  Resolved acute metabolic encephalopathy, likely multifactorial secondary to COVID-19 viral  infection, C. difficile infection, dehydration from diarrhea. Continue to treat underlying conditions.   COVID-19 screening test positive on 05/24/2021 and 05/31/2021. C. difficile PCR positive on 05/31/2021. Continue Airborne/enteric precautions.   Newly diagnosed C. difficile colitis. Presented with frequent bouts of diarrhea C. difficile PCR positive on 05/31/2021. Continue on p.o. vancomycin 125 mg 4 times daily x10 days Continue Florastor 250 mg twice daily Please wash your hands after using the restroom.   Improved generalized weakness, multifactorial secondary to above Seen by OT, no OT follow-up recommended. Continue fall precautions   Resolved post repletion: Hypokalemia, likely from GI losses with diarrhea Serum potassium 3.4> 4.1. Magnesium 1.9   BPH Continue home regimen Follow-up with your PCP   Essential hypertension Continue home regimen Follow-up with your PCP   Gout Continue home regimen.   Hyperlipidemia Continue home regimen.   Treated UTI, POA UA positive for pyuria, POA. Urine culture collected on 05/31/2021 no growth. Will DC Rocephin on 06/01/2021, he received 1 dose.     Code Status: Full code    Consultants: None   Procedures: None   Antimicrobials: Rocephin x1 dose on 06/01/2021. P.o. vancomycin 4 times daily x10 days.     Discharge Exam: BP 110/85 (BP Location: Right Arm)    Pulse 64    Temp 98.3 F (36.8 C) (Axillary)    Resp 17    Ht 5\' 11"  (1.803 m)    Wt 72.6 kg    SpO2 96%    BMI 22.32 kg/m  General: 81 y.o. year-old  male well developed well nourished in no acute distress.  Alert and oriented x3. Cardiovascular: Regular rate and rhythm with no rubs or gallops.  No thyromegaly or JVD noted.   Respiratory: Clear to auscultation with no wheezes or rales. Good inspiratory effort. Abdomen: Soft nontender nondistended with normal bowel sounds x4 quadrants. Musculoskeletal: No lower extremity edema. 2/4 pulses in all 4  extremities. Skin: No ulcerative lesions noted or rashes, Psychiatry: Mood is appropriate for condition and setting  Discharge Instructions You were cared for by a hospitalist during your hospital stay. If you have any questions about your discharge medications or the care you received while you were in the hospital after you are discharged, you can call the unit and asked to speak with the hospitalist on call if the hospitalist that took care of you is not available. Once you are discharged, your primary care physician will handle any further medical issues. Please note that NO REFILLS for any discharge medications will be authorized once you are discharged, as it is imperative that you return to your primary care physician (or establish a relationship with a primary care physician if you do not have one) for your aftercare needs so that they can reassess your need for medications and monitor your lab values.   Allergies as of 06/02/2021       Reactions   Aricept [donepezil]    dizziness   Levetiracetam Other (See Comments)   AMS   Penicillins Hives   Viagra [sildenafil]    dizziness        Medication List     STOP taking these medications    amLODipine 5 MG tablet Commonly known as: NORVASC   cephALEXin 500 MG capsule Commonly known as: KEFLEX       TAKE these medications    allopurinol 100 MG tablet Commonly known as: Zyloprim Take 1 tablet (100 mg total) by mouth daily.   atorvastatin 40 MG tablet Commonly known as: LIPITOR Take 0.5 tablets (20 mg total) by mouth daily.   brimonidine 0.2 % ophthalmic solution Commonly known as: ALPHAGAN Place 1 drop into both eyes 2 (two) times daily.   CLEAR EYES OP Apply 2 drops to eye daily as needed (dry eyes).   dutasteride 0.5 MG capsule Commonly known as: AVODART Take 0.5 mg by mouth daily.   ENSURE ACTIVE HEART HEALTH PO Take 1 Bottle by mouth as needed (appetite, nutrition).   latanoprost 0.005 % ophthalmic  solution Commonly known as: XALATAN Place 1 drop into both eyes at bedtime.   memantine 10 MG tablet Commonly known as: NAMENDA Take 20 mg by mouth at bedtime.   metoprolol succinate 25 MG 24 hr tablet Commonly known as: TOPROL-XL Take 0.5 tablets (12.5 mg total) by mouth at bedtime.   saccharomyces boulardii 250 MG capsule Commonly known as: FLORASTOR Take 1 capsule (250 mg total) by mouth 2 (two) times daily.   tamsulosin 0.4 MG Caps capsule Commonly known as: FLOMAX Take 0.4 mg by mouth at bedtime.   vancomycin 125 MG capsule Commonly known as: VANCOCIN Take 1 capsule (125 mg total) by mouth 4 (four) times daily for 10 days.       Allergies  Allergen Reactions   Aricept [Donepezil]     dizziness   Levetiracetam Other (See Comments)    AMS   Penicillins Hives   Viagra [Sildenafil]     dizziness    Follow-up Information     [facility] Call today.   Why:  Please call for a posthospital follow-up appointment. Contact information: [place] [address] [phone]         Lorretta Harp, MD .   Specialties: Cardiology, Radiology Contact information: [address] [place] [address] [phone]         Evans Lance, MD .   Specialty: Cardiology Contact information: [phone] [address] [place] [address] [phone]                  The results of significant diagnostics from this hospitalization (including imaging, microbiology, ancillary and laboratory) are listed below for reference.    Significant Diagnostic Studies: CT Head Wo Contrast  Result Date: 05/24/2021 CLINICAL DATA:  Dizziness.  Alert and oriented EXAM: CT HEAD WITHOUT CONTRAST TECHNIQUE: Contiguous axial images were obtained from the base of the skull through the vertex without intravenous contrast. COMPARISON:  04/01/2021 FINDINGS: Brain: No evidence of acute infarction, hemorrhage,  extra-axial collection, ventriculomegaly, or mass effect. Generalized cerebral atrophy. Periventricular white matter low attenuation likely secondary to microangiopathy. Vascular: Cerebrovascular atherosclerotic calcifications are noted. Skull: Negative for fracture or focal lesion. Sinuses/Orbits: Visualized portions of the orbits are unremarkable. Visualized portions of the paranasal sinuses are unremarkable. Visualized portions of the mastoid air cells are unremarkable. Other: None. IMPRESSION: 1. No acute intracranial pathology. 2. Chronic microvascular disease and cerebral atrophy. Electronically Signed   By: Kathreen Devoid M.D.   On: 05/24/2021 11:01   DG Chest Port 1 View  Result Date: 05/31/2021 CLINICAL DATA:  Weakness for the past 2 weeks. Diagnosed with COVID before Christmas. EXAM: PORTABLE CHEST 1 VIEW COMPARISON:  Chest x-ray dated April 01, 2021. FINDINGS: Unchanged left chest wall pacemaker. The heart size and mediastinal contours are within normal limits. Normal pulmonary vascularity. No focal consolidation, pleural effusion, or pneumothorax. Unchanged elevation of the right hemidiaphragm with chronic right basilar atelectasis/scarring. No acute osseous abnormality. IMPRESSION: 1. No active disease. Electronically Signed   By: Titus Dubin M.D.   On: 05/31/2021 10:50   CUP PACEART REMOTE DEVICE CHECK  Result Date: 05/23/2021 Scheduled remote reviewed. Normal device function.  Mean HR trending up Next remote 91 days. LR   Microbiology: Recent Results (from the past 240 hour(s))  Resp Panel by RT-PCR (Flu A&B, Covid) Nasopharyngeal Swab     Status: Abnormal   Collection Time: 05/24/21 11:20 AM   Specimen: Nasopharyngeal Swab; Nasopharyngeal(NP) swabs in vial transport medium  Result Value Ref Range Status   SARS Coronavirus 2 by RT PCR POSITIVE (A) NEGATIVE Final    Comment: (NOTE) SARS-CoV-2 target nucleic acids are DETECTED.  The SARS-CoV-2 RNA is generally detectable in  upper respiratory specimens during the acute phase of infection. Positive results are indicative of the presence of the identified virus, but do not rule out bacterial infection or co-infection with other pathogens not detected by the test. Clinical correlation with patient history and other diagnostic information is necessary to determine patient infection status. The expected result is Negative.  Fact Sheet for Patients: EntrepreneurPulse.com.au  Fact Sheet for Healthcare Providers: IncredibleEmployment.be  This test is not yet approved or cleared by the [place] FDA and  has been authorized for detection and/or diagnosis of SARS-CoV-2 by FDA under an Emergency Use Authorization (EUA).  This EUA will remain in effect (meaning this test can be used) for the duration of  the COVID-19 declaration under Section 564(b)(1) of the A ct, 21 U.S.C. section 360bbb-3(b)(1), unless the authorization is  terminated or revoked sooner.     Influenza A by PCR NEGATIVE NEGATIVE Final   Influenza B by PCR NEGATIVE NEGATIVE Final    Comment: (NOTE) The Xpert Xpress SARS-CoV-2/FLU/RSV plus assay is intended as an aid in the diagnosis of influenza from Nasopharyngeal swab specimens and should not be used as a sole basis for treatment. Nasal washings and aspirates are unacceptable for Xpert Xpress SARS-CoV-2/FLU/RSV testing.  Fact Sheet for Patients: EntrepreneurPulse.com.au  Fact Sheet for Healthcare Providers: IncredibleEmployment.be  This test is not yet approved or cleared by the [place] FDA and has been authorized for detection and/or diagnosis of SARS-CoV-2 by FDA under an Emergency Use Authorization (EUA). This EUA will remain in effect (meaning this test can be used) for the duration of the COVID-19 declaration under Section 564(b)(1) of the Act, 21 U.S.C. section 360bbb-3(b)(1), unless the authorization is  terminated or revoked.  Performed at [facility], [place] [address] [place], [place] [address]   Urine Culture     Status: Abnormal   Collection Time: 05/24/21  3:00 PM   Specimen: Urine, Clean Catch  Result Value Ref Range Status   Specimen Description   Final    URINE, CLEAN CATCH Performed at [facility], [place] [address], [place], [place] [address]    Special Requests   Final    NONE Performed at [facility], [place] [address] [place], [place] [address]    Culture (A)  Final    <10,000 COLONIES/mL INSIGNIFICANT GROWTH Performed at [place] [address], [place], [place] [address]    Report Status 05/25/2021 FINAL  Final  Urine Culture     Status: None   Collection Time: 05/31/21 10:25 AM   Specimen: Urine, Clean Catch  Result Value Ref Range Status   Specimen Description   Final    URINE, CLEAN CATCH Performed at [facility], [place] [address], [place], [place] [address]    Special Requests   Final    NONE Performed at [facility], [place] [address], [place], [place] [address]    Culture   Final    NO GROWTH Performed at [facility], [place] [address] [place], [place] [address]    Report Status 06/01/2021 FINAL  Final  C Difficile Quick Screen w PCR reflex     Status: Abnormal   Collection Time: 05/31/21  2:22 PM   Specimen: STOOL  Result Value Ref Range Status   C Diff antigen POSITIVE (A) NEGATIVE Final   C Diff toxin POSITIVE (A) NEGATIVE Final   C Diff interpretation Toxin producing C. difficile detected.  Final    Comment: CRITICAL RESULT CALLED TO, READ BACK BY AND VERIFIED WITH: CONRAD,Q. 05/31/21 @1900  BY SEEL,M. Performed at [facility], [place] [address] [place],  [address]   Gastrointestinal Panel by PCR , Stool     Status: None   Collection Time: 05/31/21  2:23 PM   Specimen: STOOL  Result Value Ref Range Status    Campylobacter species NOT DETECTED NOT DETECTED Final   Plesimonas shigelloides NOT DETECTED NOT DETECTED Final   Salmonella species NOT DETECTED NOT DETECTED Final   Yersinia enterocolitica NOT DETECTED NOT DETECTED Final   Vibrio species NOT DETECTED NOT DETECTED Final   Vibrio cholerae NOT DETECTED NOT DETECTED Final   Enteroaggregative E coli (EAEC) NOT DETECTED NOT DETECTED Final   Enteropathogenic E coli (EPEC) NOT DETECTED NOT DETECTED Final  Enterotoxigenic E coli (ETEC) NOT DETECTED NOT DETECTED Final   Shiga like toxin producing E coli (STEC) NOT DETECTED NOT DETECTED Final   Shigella/Enteroinvasive E coli (EIEC) NOT DETECTED NOT DETECTED Final   Cryptosporidium NOT DETECTED NOT DETECTED Final   Cyclospora cayetanensis NOT DETECTED NOT DETECTED Final   Entamoeba histolytica NOT DETECTED NOT DETECTED Final   Giardia lamblia NOT DETECTED NOT DETECTED Final   Adenovirus F40/41 NOT DETECTED NOT DETECTED Final   Astrovirus NOT DETECTED NOT DETECTED Final   Norovirus GI/GII NOT DETECTED NOT DETECTED Final   Rotavirus A NOT DETECTED NOT DETECTED Final   Sapovirus (I, II, IV, and V) NOT DETECTED NOT DETECTED Final    Comment: Performed at [facility], [place] [place], [place] [address]  Resp Panel by RT-PCR (Flu A&B, Covid) Nasopharyngeal Swab     Status: Abnormal   Collection Time: 05/31/21  5:24 PM   Specimen: Nasopharyngeal Swab; Nasopharyngeal(NP) swabs in vial transport medium  Result Value Ref Range Status   SARS Coronavirus 2 by RT PCR POSITIVE (A) NEGATIVE Final    Comment: (NOTE) SARS-CoV-2 target nucleic acids are DETECTED.  The SARS-CoV-2 RNA is generally detectable in upper respiratory specimens during the acute phase of infection. Positive results are indicative of the presence of the identified virus, but do not rule out bacterial infection or co-infection with other pathogens not detected by the test. Clinical correlation with patient history  and other diagnostic information is necessary to determine patient infection status. The expected result is Negative.  Fact Sheet for Patients: EntrepreneurPulse.com.au  Fact Sheet for Healthcare Providers: IncredibleEmployment.be  This test is not yet approved or cleared by the [place] FDA and  has been authorized for detection and/or diagnosis of SARS-CoV-2 by FDA under an Emergency Use Authorization (EUA).  This EUA will remain in effect (meaning this test can be used) for the duration of  the COVID-19 declaration under Section 564(b)(1) of the A ct, 21 U.S.C. section 360bbb-3(b)(1), unless the authorization is terminated or revoked sooner.     Influenza A by PCR NEGATIVE NEGATIVE Final   Influenza B by PCR NEGATIVE NEGATIVE Final    Comment: (NOTE) The Xpert Xpress SARS-CoV-2/FLU/RSV plus assay is intended as an aid in the diagnosis of influenza from Nasopharyngeal swab specimens and should not be used as a sole basis for treatment. Nasal washings and aspirates are unacceptable for Xpert Xpress SARS-CoV-2/FLU/RSV testing.  Fact Sheet for Patients: EntrepreneurPulse.com.au  Fact Sheet for Healthcare Providers: IncredibleEmployment.be  This test is not yet approved or cleared by the [place] FDA and has been authorized for detection and/or diagnosis of SARS-CoV-2 by FDA under an Emergency Use Authorization (EUA). This EUA will remain in effect (meaning this test can be used) for the duration of the COVID-19 declaration under Section 564(b)(1) of the Act, 21 U.S.C. section 360bbb-3(b)(1), unless the authorization is terminated or revoked.  Performed at [facility], [place] [name] [place], [place] [address]      Labs: Basic Metabolic Panel: Recent Labs  Lab 05/30/21 1702 05/31/21 1025 05/31/21 2214 06/01/21 0606 06/02/21 0811  NA 141 140  --  140 139  K 3.6  3.4*  --  3.4* 4.1  CL 109 106  --  107 109  CO2 24 24  --  25 27  GLUCOSE 117* 99  --  86 87  BUN 17 25*  --  21 12  CREATININE 1.38* 1.24  --  1.10 0.80  CALCIUM 9.9 9.4  --  9.0 9.1  MG  --   --  1.9  --   --    Liver Function Tests: Recent Labs  Lab 05/31/21 1025 06/01/21 0606  AST 19 17  ALT 20 16  ALKPHOS 55 50  BILITOT 1.0 0.7  PROT 8.2* 6.6  ALBUMIN 4.1 3.3*   No results for input(s): LIPASE, AMYLASE in the last 168 hours. No results for input(s): AMMONIA in the last 168 hours. CBC: Recent Labs  Lab 05/30/21 1702 05/31/21 1025 06/01/21 0606  WBC 15.3* 10.4 8.1  NEUTROABS  --  8.6*  --   HGB 16.6 16.4 15.1  HCT 49.9 48.7 44.6  MCV 93.6 93.8 95.3  PLT 148* 156 141*   Cardiac Enzymes: No results for input(s): CKTOTAL, CKMB, CKMBINDEX, TROPONINI in the last 168 hours. BNP: BNP (last 3 results) No results for input(s): BNP in the last 8760 hours.  ProBNP (last 3 results) No results for input(s): PROBNP in the last 8760 hours.  CBG: Recent Labs  Lab 06/01/21 1217  GLUCAP 96       Signed:  Kayleen Memos, MD [facility] 06/02/2021, 3:30 PM

## 2021-06-04 ENCOUNTER — Encounter: Payer: Self-pay | Admitting: Internal Medicine

## 2021-06-05 ENCOUNTER — Other Ambulatory Visit: Payer: Self-pay

## 2021-06-05 ENCOUNTER — Emergency Department (HOSPITAL_COMMUNITY)
Admission: EM | Admit: 2021-06-05 | Discharge: 2021-06-05 | Disposition: A | Discharge status: Home / Self Care | Payer: No Typology Code available for payment source | Attending: Emergency Medicine | Admitting: Emergency Medicine

## 2021-06-05 ENCOUNTER — Encounter (HOSPITAL_COMMUNITY): Payer: Self-pay | Admitting: Emergency Medicine

## 2021-06-05 ENCOUNTER — Emergency Department (HOSPITAL_COMMUNITY): Payer: No Typology Code available for payment source

## 2021-06-05 DIAGNOSIS — R0902 Hypoxemia: Secondary | ICD-10-CM | POA: Diagnosis not present

## 2021-06-05 DIAGNOSIS — Z79899 Other long term (current) drug therapy: Secondary | ICD-10-CM | POA: Diagnosis not present

## 2021-06-05 DIAGNOSIS — S199XXA Unspecified injury of neck, initial encounter: Secondary | ICD-10-CM | POA: Diagnosis not present

## 2021-06-05 DIAGNOSIS — R519 Headache, unspecified: Secondary | ICD-10-CM | POA: Diagnosis not present

## 2021-06-05 DIAGNOSIS — M436 Torticollis: Secondary | ICD-10-CM | POA: Diagnosis not present

## 2021-06-05 DIAGNOSIS — I10 Essential (primary) hypertension: Secondary | ICD-10-CM | POA: Diagnosis not present

## 2021-06-05 DIAGNOSIS — G4489 Other headache syndrome: Secondary | ICD-10-CM | POA: Diagnosis not present

## 2021-06-05 DIAGNOSIS — M542 Cervicalgia: Secondary | ICD-10-CM | POA: Diagnosis present

## 2021-06-05 MED ORDER — ACETAMINOPHEN 500 MG PO TABS
1000.0000 mg | ORAL_TABLET | Freq: Once | ORAL | Status: AC
  Administered 2021-06-05: 1000 mg via ORAL
  Filled 2021-06-05: qty 2

## 2021-06-05 MED ORDER — DICLOFENAC SODIUM 1 % EX GEL: 4.0000 g | Freq: Four times a day (QID) | CUTANEOUS | 0 refills | Status: DC

## 2021-06-05 MED ORDER — DIAZEPAM 2 MG PO TABS
2.0000 mg | ORAL_TABLET | Freq: Once | ORAL | Status: AC
  Administered 2021-06-05: 2 mg via ORAL
  Filled 2021-06-05: qty 1

## 2021-06-05 MED ORDER — KETOROLAC TROMETHAMINE 15 MG/ML IJ SOLN
15.0000 mg | Freq: Once | INTRAMUSCULAR | Status: AC
  Administered 2021-06-05: 15 mg via INTRAMUSCULAR
  Filled 2021-06-05: qty 1

## 2021-06-05 MED ORDER — IBUPROFEN 400 MG PO TABS
400.0000 mg | ORAL_TABLET | Freq: Once | ORAL | Status: AC | PRN
  Administered 2021-06-05: 400 mg via ORAL
  Filled 2021-06-05: qty 1

## 2021-06-05 MED ORDER — METHOCARBAMOL 500 MG PO TABS: 500.0000 mg | ORAL_TABLET | Freq: Two times a day (BID) | ORAL | 0 refills | Status: DC

## 2021-06-05 MED ORDER — OXYCODONE HCL 5 MG PO TABS
2.5000 mg | ORAL_TABLET | Freq: Once | ORAL | Status: AC
  Administered 2021-06-05: 2.5 mg via ORAL
  Filled 2021-06-05: qty 1

## 2021-06-05 NOTE — Progress Notes (Signed)
Remote pacemaker transmission.   

## 2021-06-05 NOTE — ED Notes (Signed)
Pt continue to c/o neck pain. Denies fall/injury. States pain started yesterday while at rest and has been ongoing since. On assessment pt has pain back of his head and into his neck rating 10/10. Has tenderness on palpation. States he is unable to move neck up, and side to side

## 2021-06-05 NOTE — Discharge Instructions (Signed)
Use the gel as prescribed Also take tylenol 1000mg (2 extra strength) four times a day.   Return for worsening pain, fever, one sided numbness or weakness.

## 2021-06-05 NOTE — ED Notes (Signed)
Patient transported to CT 

## 2021-06-05 NOTE — ED Provider Notes (Signed)
[facility] EMERGENCY DEPARTMENT Provider Note   CSN: [id number] Arrival date & time: 06/05/21  9562     History  Chief Complaint  Patient presents with   Headache    Dustin Hamilton is a 81 y.o. male.  81 yo M with a chief complaint of neck pain.  Going on since yesterday.  Patient was just discharged from the hospital couple days ago for C. difficile infection and weakness.  He developed the neck pain that sometimes radiates to his head.  Is been coming and going.  Worse with movement palpation.  Hurts about the midline.  Denies trauma to the area denies one-sided numbness or weakness denies difficulty speech or swallowing.  The history is provided by the patient.  Headache Pain location:  Generalized Radiates to:  Does not radiate Severity currently:  7/10 Severity at highest:  7/10 Onset quality:  Sudden Duration:  2 days Timing:  Constant Progression:  Worsening Chronicity:  New Similar to prior headaches: no   Relieved by:  Nothing Worsened by:  Nothing Ineffective treatments:  None tried Associated symptoms: neck pain   Associated symptoms: no abdominal pain, no congestion, no diarrhea, no fever, no myalgias and no vomiting       Home Medications Prior to Admission medications   Medication Sig Start Date End Date Taking? Authorizing Provider  allopurinol (ZYLOPRIM) 100 MG tablet Take 1 tablet (100 mg total) by mouth daily. 04/05/21 04/05/22 Yes Pokhrel, Laxman, MD  atorvastatin (LIPITOR) 40 MG tablet Take 0.5 tablets (20 mg total) by mouth daily. 04/05/21  Yes Pokhrel, Laxman, MD  brimonidine (ALPHAGAN) 0.2 % ophthalmic solution Place 1 drop into both eyes 2 (two) times daily.   Yes [provider]  diclofenac Sodium (VOLTAREN) 1 % GEL Apply 4 g topically 4 (four) times daily. 06/05/21  Yes Deno Etienne, DO  dutasteride (AVODART) 0.5 MG capsule Take 0.5 mg by mouth daily.   Yes [provider]  latanoprost (XALATAN) 0.005 %  ophthalmic solution Place 1 drop into both eyes at bedtime.    Yes [provider]  memantine (NAMENDA) 10 MG tablet Take 20 mg by mouth at bedtime.   Yes [provider]  methocarbamol (ROBAXIN) 500 MG tablet Take 1 tablet (500 mg total) by mouth 2 (two) times daily. 06/05/21  Yes Deno Etienne, DO  metoprolol succinate (TOPROL-XL) 25 MG 24 hr tablet Take 0.5 tablets (12.5 mg total) by mouth at bedtime. 05/31/20  Yes Lilland, Alana, DO  Naphazoline HCl (CLEAR EYES OP) Apply 2 drops to eye daily as needed (dry eyes).   Yes [provider]  Nutritional Supplements ([place] PO) Take 1 Bottle by mouth daily as needed (appetite, nutrition).   Yes [provider]  saccharomyces boulardii (FLORASTOR) 250 MG capsule Take 1 capsule (250 mg total) by mouth 2 (two) times daily. 06/02/21 08/31/21 Yes Kayleen Memos, DO  tamsulosin (FLOMAX) 0.4 MG CAPS capsule Take 0.4 mg by mouth at bedtime.   Yes [provider]  vancomycin (VANCOCIN) 125 MG capsule Take 1 capsule (125 mg total) by mouth 4 (four) times daily for 10 days. Patient not taking: Reported on 06/05/2021 06/02/21 06/12/21  Kayleen Memos, DO      Allergies    Aricept [donepezil], Levetiracetam, Penicillins, and Viagra [sildenafil]    Review of Systems   Review of Systems  Constitutional:  Negative for chills and fever.  HENT:  Negative for congestion and facial swelling.  Eyes:  Negative for discharge and visual disturbance.  Respiratory:  Negative for shortness of breath.   Cardiovascular:  Negative for chest pain and palpitations.  Gastrointestinal:  Negative for abdominal pain, diarrhea and vomiting.  Musculoskeletal:  Positive for neck pain. Negative for arthralgias and myalgias.  Skin:  Negative for color change and rash.  Neurological:  Positive for headaches. Negative for tremors and syncope.  Psychiatric/Behavioral:  Negative for confusion and dysphoric mood.    Physical  Exam Updated Vital Signs BP (!) 161/96 (BP Location: Left Arm)    Pulse 70    Temp 97.7 F (36.5 C) (Oral)    Resp 16    SpO2 97%  Physical Exam Vitals and nursing note reviewed.  Constitutional:      Appearance: He is well-developed.  HENT:     Head: Normocephalic and atraumatic.  Eyes:     Pupils: Pupils are equal, round, and reactive to light.  Neck:     Vascular: No JVD.     Comments: Midline c spine pain worse about C4-6 Cardiovascular:     Rate and Rhythm: Normal rate and regular rhythm.     Heart sounds: No murmur heard.   No friction rub. No gallop.  Pulmonary:     Effort: No respiratory distress.     Breath sounds: No wheezing.  Abdominal:     General: There is no distension.     Tenderness: There is no abdominal tenderness. There is no guarding or rebound.  Musculoskeletal:        General: Normal range of motion.     Cervical back: Normal range of motion and neck supple.  Skin:    Coloration: Skin is not pale.     Findings: No rash.  Neurological:     Mental Status: He is alert and oriented to person, place, and time.  Psychiatric:        Behavior: Behavior normal.    ED Results / Procedures / Treatments   Labs (all labs ordered are listed, but only abnormal results are displayed) Labs Reviewed - No data to display  EKG None  Radiology CT Head Wo Contrast  Result Date: 06/05/2021 CLINICAL DATA:  Severe headache.  Sudden onset.  Hypertension. EXAM: CT HEAD WITHOUT CONTRAST TECHNIQUE: Contiguous axial images were obtained from the base of the skull through the vertex without intravenous contrast. COMPARISON:  CT head 05/24/2021 FINDINGS: Brain: Mild ventricular enlargement, stable. This is associated with atrophy. Moderate white matter changes with diffuse white matter hypodensity, chronic and unchanged. Negative for acute infarct, hemorrhage, mass. Vascular: Negative for hyperdense vessel Skull: Negative Sinuses/Orbits: Mild mucosal edema paranasal sinuses.  Bilateral cataract extraction. Other: None IMPRESSION: Atrophy and chronic microvascular ischemic change. No acute abnormality no change from the recent CT. Electronically Signed   By: Franchot Gallo M.D.   On: 06/05/2021 17:25   CT Cervical Spine Wo Contrast  Result Date: 06/05/2021 CLINICAL DATA:  Neck trauma. EXAM: CT CERVICAL SPINE WITHOUT CONTRAST TECHNIQUE: Multidetector CT imaging of the cervical spine was performed without intravenous contrast. Multiplanar CT image reconstructions were also generated. COMPARISON:  None. FINDINGS: Alignment: No acute subluxation. Skull base and vertebrae: No acute fracture. Soft tissues and spinal canal: No prevertebral fluid or swelling. No visible canal hematoma. Disc levels:  Multilevel degenerative changes. Upper chest: Negative. Other: Bilateral carotid bulb calcified plaques. IMPRESSION: 1. No acute/traumatic cervical spine pathology. 2. Multilevel degenerative changes. Electronically Signed   By: Anner Crete M.D.   On: 06/05/2021 23:00  Procedures Procedures    Medications Ordered in ED Medications  ibuprofen (ADVIL) tablet 400 mg (400 mg Oral Given 06/05/21 0830)  acetaminophen (TYLENOL) tablet 1,000 mg (1,000 mg Oral Given 06/05/21 2140)  oxyCODONE (Oxy IR/ROXICODONE) immediate release tablet 2.5 mg (2.5 mg Oral Given 06/05/21 2139)  diazepam (VALIUM) tablet 2 mg (2 mg Oral Given 06/05/21 2139)  ketorolac (TORADOL) 15 MG/ML injection 15 mg (15 mg Intramuscular Given 06/05/21 2143)    ED Course/ Medical Decision Making/ A&P                           Medical Decision Making  81 yo M recently in the hospital for C. difficile infection comes in with neck pain.  Likely a cervical strain based on history.  Will obtain CT imaging.  He had a CT of his head ordered in triage that was negative for acute intracranial pathology.  We will attempt to treat his pain here.   CT scan of the C-spine is negative for acute pathology.  Patient feeling a bit better  after symptomatic therapy.  Will discharge home.  PCP follow-up.  11:09 PM:  I have discussed the diagnosis/risks/treatment options with the patient and believe the pt to be eligible for discharge home to follow-up with PCP. We also discussed returning to the ED immediately if new or worsening sx occur. We discussed the sx which are most concerning (e.g., sudden worsening pain, fever, inability to tolerate by mouth) that necessitate immediate return. Medications administered to the patient during their visit and any new prescriptions provided to the patient are listed below.  Medications given during this visit Medications  ibuprofen (ADVIL) tablet 400 mg (400 mg Oral Given 06/05/21 0830)  acetaminophen (TYLENOL) tablet 1,000 mg (1,000 mg Oral Given 06/05/21 2140)  oxyCODONE (Oxy IR/ROXICODONE) immediate release tablet 2.5 mg (2.5 mg Oral Given 06/05/21 2139)  diazepam (VALIUM) tablet 2 mg (2 mg Oral Given 06/05/21 2139)  ketorolac (TORADOL) 15 MG/ML injection 15 mg (15 mg Intramuscular Given 06/05/21 2143)     The patient appears reasonably screen and/or stabilized for discharge and I doubt any other medical condition or other [facility] requiring further screening, evaluation, or treatment in the ED at this time prior to discharge.         Final Clinical Impression(s) / ED Diagnoses Final diagnoses:  Torticollis    Rx / DC Orders ED Discharge Orders          Ordered    methocarbamol (ROBAXIN) 500 MG tablet  2 times daily        06/05/21 2307    diclofenac Sodium (VOLTAREN) 1 % GEL  4 times daily        06/05/21 2307              Deno Etienne, DO 06/05/21 2309

## 2021-06-05 NOTE — ED Triage Notes (Addendum)
Patient BIB [facility] from home where he lives with family for evaluation of headache x12 hours, hypertensive 170/110 with EMS and patient states he is unsure if he took his antihypertensive medications today (wife manages medications, patient states he does not think she gave him his morning medications today). Patient has no focal weaknesses.  EMS Vitals HR60 SpO2 96% on room air

## 2021-06-05 NOTE — ED Provider Triage Note (Signed)
Emergency Medicine Provider Triage Evaluation Note  ALIE HARDGROVE , a 81 y.o. male  was evaluated in triage.  Pt complains of posterior headache x 12 hours associated with blurry vision. No recent head injury. He is not currently on any blood thinners. No speech changes. Denies unilateral weakness.   Review of Systems  Positive: Headache Negative: Speech changes  Physical Exam  BP (!) 151/93    Pulse 60    Temp 97.7 F (36.5 C) (Oral)    Resp 18    SpO2 99%  Gen:   Awake, no distress   Resp:  Normal effort  MSK:   Moves extremities without difficulty  Other:  Normal speech, no facial droop. Equal grip strength  Medical Decision Making  Medically screening exam initiated at 4:19 PM.  Appropriate orders placed.  [name] was informed that the remainder of the evaluation will be completed by another provider, this initial triage assessment does not replace that evaluation, and the importance of remaining in the ED until their evaluation is complete.  Headache, CT head to rule out underlying etiology   Suzy Bouchard, PA-C 06/05/21 1621

## 2021-06-26 ENCOUNTER — Emergency Department (HOSPITAL_COMMUNITY): Payer: No Typology Code available for payment source

## 2021-06-26 ENCOUNTER — Encounter (HOSPITAL_COMMUNITY): Payer: Self-pay | Admitting: Emergency Medicine

## 2021-06-26 ENCOUNTER — Other Ambulatory Visit: Payer: Self-pay

## 2021-06-26 ENCOUNTER — Inpatient Hospital Stay (HOSPITAL_COMMUNITY)
Admission: EM | Admit: 2021-06-26 | Discharge: 2021-07-01 | DRG: 871 | Disposition: A | Discharge status: Home Health | Payer: No Typology Code available for payment source | Attending: Internal Medicine | Admitting: Internal Medicine

## 2021-06-26 DIAGNOSIS — S0990XA Unspecified injury of head, initial encounter: Secondary | ICD-10-CM | POA: Diagnosis not present

## 2021-06-26 DIAGNOSIS — Z79899 Other long term (current) drug therapy: Secondary | ICD-10-CM

## 2021-06-26 DIAGNOSIS — Z950 Presence of cardiac pacemaker: Secondary | ICD-10-CM

## 2021-06-26 DIAGNOSIS — I119 Hypertensive heart disease without heart failure: Secondary | ICD-10-CM | POA: Diagnosis present

## 2021-06-26 DIAGNOSIS — N281 Cyst of kidney, acquired: Secondary | ICD-10-CM | POA: Diagnosis not present

## 2021-06-26 DIAGNOSIS — R400 Somnolence: Secondary | ICD-10-CM | POA: Diagnosis not present

## 2021-06-26 DIAGNOSIS — G319 Degenerative disease of nervous system, unspecified: Secondary | ICD-10-CM | POA: Diagnosis not present

## 2021-06-26 DIAGNOSIS — A419 Sepsis, unspecified organism: Secondary | ICD-10-CM | POA: Diagnosis not present

## 2021-06-26 DIAGNOSIS — M542 Cervicalgia: Secondary | ICD-10-CM | POA: Diagnosis not present

## 2021-06-26 DIAGNOSIS — I471 Supraventricular tachycardia: Secondary | ICD-10-CM | POA: Diagnosis present

## 2021-06-26 DIAGNOSIS — I700 Atherosclerosis of aorta: Secondary | ICD-10-CM | POA: Diagnosis not present

## 2021-06-26 DIAGNOSIS — R338 Other retention of urine: Secondary | ICD-10-CM | POA: Diagnosis present

## 2021-06-26 DIAGNOSIS — K760 Fatty (change of) liver, not elsewhere classified: Secondary | ICD-10-CM | POA: Diagnosis present

## 2021-06-26 DIAGNOSIS — H409 Unspecified glaucoma: Secondary | ICD-10-CM | POA: Diagnosis present

## 2021-06-26 DIAGNOSIS — E861 Hypovolemia: Secondary | ICD-10-CM | POA: Diagnosis present

## 2021-06-26 DIAGNOSIS — J9811 Atelectasis: Secondary | ICD-10-CM | POA: Diagnosis not present

## 2021-06-26 DIAGNOSIS — R509 Fever, unspecified: Secondary | ICD-10-CM | POA: Diagnosis not present

## 2021-06-26 DIAGNOSIS — R6520 Severe sepsis without septic shock: Secondary | ICD-10-CM | POA: Diagnosis not present

## 2021-06-26 DIAGNOSIS — N138 Other obstructive and reflux uropathy: Secondary | ICD-10-CM | POA: Diagnosis present

## 2021-06-26 DIAGNOSIS — I6782 Cerebral ischemia: Secondary | ICD-10-CM | POA: Diagnosis not present

## 2021-06-26 DIAGNOSIS — M109 Gout, unspecified: Secondary | ICD-10-CM | POA: Diagnosis present

## 2021-06-26 DIAGNOSIS — E860 Dehydration: Secondary | ICD-10-CM | POA: Diagnosis present

## 2021-06-26 DIAGNOSIS — N179 Acute kidney failure, unspecified: Secondary | ICD-10-CM

## 2021-06-26 DIAGNOSIS — Z8673 Personal history of transient ischemic attack (TIA), and cerebral infarction without residual deficits: Secondary | ICD-10-CM

## 2021-06-26 DIAGNOSIS — Z833 Family history of diabetes mellitus: Secondary | ICD-10-CM

## 2021-06-26 DIAGNOSIS — Z823 Family history of stroke: Secondary | ICD-10-CM | POA: Diagnosis not present

## 2021-06-26 DIAGNOSIS — N401 Enlarged prostate with lower urinary tract symptoms: Secondary | ICD-10-CM | POA: Diagnosis present

## 2021-06-26 DIAGNOSIS — F0390 Unspecified dementia without behavioral disturbance: Secondary | ICD-10-CM | POA: Diagnosis present

## 2021-06-26 DIAGNOSIS — R4020 Unspecified coma: Secondary | ICD-10-CM | POA: Diagnosis not present

## 2021-06-26 DIAGNOSIS — Z8261 Family history of arthritis: Secondary | ICD-10-CM

## 2021-06-26 DIAGNOSIS — G8929 Other chronic pain: Secondary | ICD-10-CM | POA: Diagnosis present

## 2021-06-26 DIAGNOSIS — Z888 Allergy status to other drugs, medicaments and biological substances status: Secondary | ICD-10-CM

## 2021-06-26 DIAGNOSIS — R339 Retention of urine, unspecified: Secondary | ICD-10-CM

## 2021-06-26 DIAGNOSIS — N2889 Other specified disorders of kidney and ureter: Secondary | ICD-10-CM | POA: Diagnosis present

## 2021-06-26 DIAGNOSIS — M549 Dorsalgia, unspecified: Secondary | ICD-10-CM | POA: Diagnosis present

## 2021-06-26 DIAGNOSIS — Z20822 Contact with and (suspected) exposure to covid-19: Secondary | ICD-10-CM | POA: Diagnosis present

## 2021-06-26 DIAGNOSIS — Z8619 Personal history of other infectious and parasitic diseases: Secondary | ICD-10-CM

## 2021-06-26 DIAGNOSIS — Z880 Allergy status to penicillin: Secondary | ICD-10-CM

## 2021-06-26 DIAGNOSIS — Z87891 Personal history of nicotine dependence: Secondary | ICD-10-CM

## 2021-06-26 DIAGNOSIS — M47812 Spondylosis without myelopathy or radiculopathy, cervical region: Secondary | ICD-10-CM | POA: Diagnosis not present

## 2021-06-26 DIAGNOSIS — Z8249 Family history of ischemic heart disease and other diseases of the circulatory system: Secondary | ICD-10-CM

## 2021-06-26 DIAGNOSIS — R55 Syncope and collapse: Secondary | ICD-10-CM | POA: Diagnosis not present

## 2021-06-26 DIAGNOSIS — G9341 Metabolic encephalopathy: Secondary | ICD-10-CM | POA: Diagnosis present

## 2021-06-26 DIAGNOSIS — Z841 Family history of disorders of kidney and ureter: Secondary | ICD-10-CM

## 2021-06-26 DIAGNOSIS — E872 Acidosis, unspecified: Secondary | ICD-10-CM | POA: Diagnosis not present

## 2021-06-26 DIAGNOSIS — E876 Hypokalemia: Secondary | ICD-10-CM | POA: Diagnosis not present

## 2021-06-26 DIAGNOSIS — N400 Enlarged prostate without lower urinary tract symptoms: Secondary | ICD-10-CM | POA: Diagnosis not present

## 2021-06-26 HISTORY — DX: Sepsis, unspecified organism: A41.9

## 2021-06-26 LAB — COMPREHENSIVE METABOLIC PANEL
ALT: 50 U/L — ABNORMAL HIGH (ref 0–44)
AST: 27 U/L (ref 15–41)
Albumin: 3.3 g/dL — ABNORMAL LOW (ref 3.5–5.0)
Alkaline Phosphatase: 61 U/L (ref 38–126)
Anion gap: 14 (ref 5–15)
BUN: 18 mg/dL (ref 8–23)
CO2: 24 mmol/L (ref 22–32)
Calcium: 10.1 mg/dL (ref 8.9–10.3)
Chloride: 102 mmol/L (ref 98–111)
Creatinine, Ser: 1.76 mg/dL — ABNORMAL HIGH (ref 0.61–1.24)
GFR, Estimated: 39 mL/min — ABNORMAL LOW (ref >60)
Glucose, Bld: 140 mg/dL — ABNORMAL HIGH (ref 70–99)
Potassium: 4.4 mmol/L (ref 3.5–5.1)
Sodium: 140 mmol/L (ref 135–145)
Total Bilirubin: 0.9 mg/dL (ref 0.3–1.2)
Total Protein: 7.9 g/dL (ref 6.5–8.1)

## 2021-06-26 LAB — BRAIN NATRIURETIC PEPTIDE: B Natriuretic Peptide: 118.5 pg/mL — ABNORMAL HIGH (ref 0.0–100.0)

## 2021-06-26 LAB — RESP PANEL BY RT-PCR (FLU A&B, COVID) ARPGX2
Influenza A by PCR: NEGATIVE
Influenza B by PCR: NEGATIVE
SARS Coronavirus 2 by RT PCR: NEGATIVE

## 2021-06-26 LAB — URINALYSIS, ROUTINE W REFLEX MICROSCOPIC
Bilirubin Urine: NEGATIVE
Glucose, UA: NEGATIVE mg/dL
Ketones, ur: NEGATIVE mg/dL
Nitrite: NEGATIVE
Protein, ur: NEGATIVE mg/dL
Specific Gravity, Urine: 1.025 (ref 1.005–1.030)
pH: 5 (ref 5.0–8.0)

## 2021-06-26 LAB — TROPONIN I (HIGH SENSITIVITY)
Troponin I (High Sensitivity): 14 ng/L (ref <18)
Troponin I (High Sensitivity): 12 ng/L (ref <18)

## 2021-06-26 LAB — CBC WITH DIFFERENTIAL/PLATELET
Abs Immature Granulocytes: 0.05 10*3/uL (ref 0.00–0.07)
Basophils Absolute: 0.1 10*3/uL (ref 0.0–0.1)
Basophils Relative: 0 %
Eosinophils Absolute: 0 10*3/uL (ref 0.0–0.5)
Eosinophils Relative: 0 %
HCT: 47.6 % (ref 39.0–52.0)
Hemoglobin: 15.3 g/dL (ref 13.0–17.0)
Immature Granulocytes: 0 %
Lymphocytes Relative: 4 %
Lymphs Abs: 0.5 10*3/uL — ABNORMAL LOW (ref 0.7–4.0)
MCH: 31 pg (ref 26.0–34.0)
MCHC: 32.1 g/dL (ref 30.0–36.0)
MCV: 96.4 fL (ref 80.0–100.0)
Monocytes Absolute: 0.6 10*3/uL (ref 0.1–1.0)
Monocytes Relative: 4 %
Neutro Abs: 11.3 10*3/uL — ABNORMAL HIGH (ref 1.7–7.7)
Neutrophils Relative %: 92 %
Platelets: 227 10*3/uL (ref 150–400)
RBC: 4.94 MIL/uL (ref 4.22–5.81)
RDW: 14.2 % (ref 11.5–15.5)
WBC: 12.5 10*3/uL — ABNORMAL HIGH (ref 4.0–10.5)
nRBC: 0.2 % (ref 0.0–0.2)

## 2021-06-26 LAB — LACTIC ACID, PLASMA
Lactic Acid, Venous: 4.5 mmol/L (ref 0.5–1.9)
Lactic Acid, Venous: 2.8 mmol/L (ref 0.5–1.9)
Lactic Acid, Venous: 3.3 mmol/L (ref 0.5–1.9)

## 2021-06-26 LAB — CBG MONITORING, ED: Glucose-Capillary: 132 mg/dL — ABNORMAL HIGH (ref 70–99)

## 2021-06-26 MED ORDER — FENTANYL CITRATE PF 50 MCG/ML IJ SOSY
25.0000 ug | PREFILLED_SYRINGE | Freq: Once | INTRAMUSCULAR | Status: AC
  Administered 2021-06-26: 11:00:00 25 ug via INTRAVENOUS
  Filled 2021-06-26: qty 1

## 2021-06-26 MED ORDER — ALLOPURINOL 100 MG PO TABS
100.0000 mg | ORAL_TABLET | Freq: Every day | ORAL | Status: DC
Start: 2021-06-26 — End: 2021-07-01
  Administered 2021-06-26 – 2021-07-01 (×6): 100 mg via ORAL
  Filled 2021-06-26 (×6): qty 1

## 2021-06-26 MED ORDER — VANCOMYCIN HCL 1500 MG/300ML IV SOLN
1500.0000 mg | Freq: Once | INTRAVENOUS | Status: AC
  Administered 2021-06-26: 11:00:00 1500 mg via INTRAVENOUS
  Filled 2021-06-26: qty 300

## 2021-06-26 MED ORDER — LACTATED RINGERS IV BOLUS (SEPSIS)
1000.0000 mL | Freq: Once | INTRAVENOUS | Status: AC
  Administered 2021-06-26: 08:00:00 1000 mL via INTRAVENOUS

## 2021-06-26 MED ORDER — TIZANIDINE HCL 2 MG PO TABS: 4.0000 mg | ORAL_TABLET | Freq: Two times a day (BID) | ORAL | Status: DC | PRN

## 2021-06-26 MED ORDER — LIDOCAINE 5 % EX PTCH: 1.0000 | MEDICATED_PATCH | Freq: Every day | CUTANEOUS | Status: DC | PRN

## 2021-06-26 MED ORDER — TAMSULOSIN HCL 0.4 MG PO CAPS
0.4000 mg | ORAL_CAPSULE | Freq: Every day | ORAL | Status: DC
  Administered 2021-06-26 – 2021-06-30 (×5): 0.4 mg via ORAL
  Filled 2021-06-26 (×5): qty 1

## 2021-06-26 MED ORDER — DICLOFENAC SODIUM 1 % EX GEL: 4.0000 g | Freq: Four times a day (QID) | CUTANEOUS | Status: DC | PRN

## 2021-06-26 MED ORDER — LACTATED RINGERS IV BOLUS (SEPSIS)
250.0000 mL | Freq: Once | INTRAVENOUS | Status: AC
  Administered 2021-06-26: 16:00:00 250 mL via INTRAVENOUS

## 2021-06-26 MED ORDER — LACTATED RINGERS IV SOLN: INTRAVENOUS | Status: AC

## 2021-06-26 MED ORDER — ENOXAPARIN SODIUM 30 MG/0.3ML IJ SOSY
30.0000 mg | PREFILLED_SYRINGE | INTRAMUSCULAR | Status: DC
  Administered 2021-06-26: 18:00:00 30 mg via SUBCUTANEOUS
  Filled 2021-06-26: qty 0.3

## 2021-06-26 MED ORDER — MEMANTINE HCL 10 MG PO TABS
20.0000 mg | ORAL_TABLET | Freq: Every day | ORAL | Status: DC
  Administered 2021-06-26: 23:00:00 10 mg via ORAL
  Administered 2021-06-27 – 2021-06-30 (×4): 20 mg via ORAL
  Filled 2021-06-26 (×8): qty 2

## 2021-06-26 MED ORDER — METRONIDAZOLE 500 MG/100ML IV SOLN
500.0000 mg | Freq: Once | INTRAVENOUS | Status: AC
  Administered 2021-06-26: 09:00:00 500 mg via INTRAVENOUS
  Filled 2021-06-26: qty 100

## 2021-06-26 MED ORDER — SODIUM CHLORIDE 0.9 % IV SOLN: 2.0000 g | Freq: Once | INTRAVENOUS | Status: DC

## 2021-06-26 MED ORDER — DUTASTERIDE 0.5 MG PO CAPS
0.5000 mg | ORAL_CAPSULE | Freq: Every day | ORAL | Status: DC
  Administered 2021-06-26 – 2021-07-01 (×6): 0.5 mg via ORAL
  Filled 2021-06-26 (×6): qty 1

## 2021-06-26 MED ORDER — CEFEPIME HCL 2 G IJ SOLR
2.0000 g | Freq: Once | INTRAMUSCULAR | Status: AC
  Administered 2021-06-26: 11:00:00 2 g via INTRAVENOUS
  Filled 2021-06-26: qty 2

## 2021-06-26 MED ORDER — FENTANYL CITRATE PF 50 MCG/ML IJ SOSY
25.0000 ug | PREFILLED_SYRINGE | Freq: Once | INTRAMUSCULAR | Status: AC
Start: 2021-06-26 — End: 2021-06-26
  Administered 2021-06-26: 10:00:00 25 ug via INTRAVENOUS
  Filled 2021-06-26: qty 1

## 2021-06-26 MED ORDER — VANCOMYCIN HCL IN DEXTROSE 1-5 GM/200ML-% IV SOLN: 1000.0000 mg | Freq: Once | INTRAVENOUS | Status: DC

## 2021-06-26 MED ORDER — IOHEXOL 350 MG/ML SOLN
80.0000 mL | Freq: Once | INTRAVENOUS | Status: AC | PRN
  Administered 2021-06-26: 09:00:00 80 mL via INTRAVENOUS

## 2021-06-26 MED ORDER — BRIMONIDINE TARTRATE 0.2 % OP SOLN
1.0000 [drp] | Freq: Two times a day (BID) | OPHTHALMIC | Status: DC
  Administered 2021-06-27 – 2021-07-01 (×9): 1 [drp] via OPHTHALMIC
  Filled 2021-06-26 (×4): qty 5

## 2021-06-26 MED ORDER — METHOCARBAMOL 500 MG PO TABS
500.0000 mg | ORAL_TABLET | Freq: Two times a day (BID) | ORAL | Status: DC | PRN
  Administered 2021-06-29: 500 mg via ORAL
  Filled 2021-06-26: qty 1

## 2021-06-26 MED ORDER — RISAQUAD PO CAPS
2.0000 | ORAL_CAPSULE | Freq: Three times a day (TID) | ORAL | Status: DC
  Administered 2021-06-26 – 2021-07-01 (×14): 2 via ORAL
  Filled 2021-06-26 (×16): qty 2

## 2021-06-26 MED ORDER — METOPROLOL SUCCINATE ER 25 MG PO TB24
12.5000 mg | ORAL_TABLET | Freq: Every day | ORAL | Status: DC
  Administered 2021-06-26 – 2021-06-30 (×5): 12.5 mg via ORAL
  Filled 2021-06-26 (×5): qty 1

## 2021-06-26 MED ORDER — ACETAMINOPHEN 500 MG PO TABS
1000.0000 mg | ORAL_TABLET | Freq: Four times a day (QID) | ORAL | Status: DC | PRN
  Administered 2021-06-28 – 2021-06-30 (×2): 1000 mg via ORAL
  Filled 2021-06-26 (×2): qty 2

## 2021-06-26 MED ORDER — VANCOMYCIN HCL 750 MG/150ML IV SOLN: 750.0000 mg | INTRAVENOUS | Status: DC

## 2021-06-26 MED ORDER — SODIUM CHLORIDE 0.9 % IV SOLN: 2.0000 g | Freq: Two times a day (BID) | INTRAVENOUS | Status: DC

## 2021-06-26 MED ORDER — LACTATED RINGERS IV BOLUS (SEPSIS)
1000.0000 mL | Freq: Once | INTRAVENOUS | Status: AC
  Administered 2021-06-26: 09:00:00 1000 mL via INTRAVENOUS

## 2021-06-26 MED ORDER — LATANOPROST 0.005 % OP SOLN
1.0000 [drp] | Freq: Every day | OPHTHALMIC | Status: DC
  Administered 2021-06-27 – 2021-06-30 (×4): 1 [drp] via OPHTHALMIC
  Filled 2021-06-26 (×2): qty 2.5

## 2021-06-26 MED ORDER — ENSURE ENLIVE PO LIQD
Freq: Every day | ORAL | Status: DC | PRN
  Filled 2021-06-26: qty 237

## 2021-06-26 MED ORDER — ATORVASTATIN CALCIUM 10 MG PO TABS
20.0000 mg | ORAL_TABLET | Freq: Every day | ORAL | Status: DC
  Administered 2021-06-26 – 2021-07-01 (×6): 20 mg via ORAL
  Filled 2021-06-26 (×6): qty 2

## 2021-06-26 NOTE — ED Notes (Signed)
Attempted in/out cath-unsuccessful.  Attempted coude cath- unsuccessful  Pt stated he felt he could urinate. Pt was unable to at this time.

## 2021-06-26 NOTE — Consult Note (Signed)
Urology Consult  Consulting MD: Regan Lemming, MD  CC: Urinary retention, difficult catheter placement  HPI: This is a 81year old male, prior patient of [facility] now followed at the [facility] medical clinics in [place], [place].  Seen here before for BPH as well as a bladder lesion with negative biopsy.  To be admitted to the medical service for probable sepsis and infection.  Evaluation included CT which revealed a distended bladder.  Catheter placement x2 was unsuccessful.  Urologic consult requested  Patient on baseline dutasteride and tamsulosin.  Normally voiding fairly well at home.  PMH: Past Medical History:  Diagnosis Date   BPH (benign prostatic hyperplasia)    Cerebrovascular disease    Clostridium difficile diarrhea    Congenital anomaly of diaphragm    Elevated PSA    Glaucoma, both eyes    Hemorrhoid    Hepatitis B surface antigen positive    02-20-2011   History of adenomatous polyp of colon    2007, 2009 and 2013  tubular adenoma's   History of alcohol abuse    quit 1963   History of cerebral parenchymal hemorrhage    01/ 2006  left occiptial lobe related to hypertensive crisis   History of CVA (cerebrovascular accident)    09-12-2012  left hippocampus/ amygdala junction and per MRI old white matter infarcts--  per pt residual short- term memory issues   History of fatty infiltration of liver hx visit's at [facility] , last visit 05/ 2014   elvated LFT's ,  via liver bx 2004 related to hx alcohol and drug abuse (quit 1964)   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   HTN (hypertension)    Renal cyst, left    Stroke ([place])    hx of 3 strokes in past    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    Urethral lesion    urethral mass    PSH: Past Surgical History:  Procedure Laterality Date   CARDIOVASCULAR STRESS TEST  05/05/2007   normal nuclear study w/ no ischemia/  normal LV fucntion  and wall motion , ef60%   COLONOSCOPY  last one 04-06-2012   CYSTO/  LEFT RETROGRADE PYELOGRAM/ CYTOLOGY WASHINGS/  URETEROSCOPY  03/05/2000   INGUINAL HERNIA REPAIR Bilateral 1965 and 1980's   LAPAROSCOPIC INGUINAL HERNIA WITH UMBILICAL HERNIA Right 56/38/9373   LIVER BIOPSY  1980's and 2004   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   Procedure: PACEMAKER IMPLANT;  Surgeon: Evans Lance, MD;  Location: [facility];  Service: Cardiovascular;  Laterality: N/A;   SVT ABLATION N/A 11/15/2018   Procedure: SVT ABLATION;  Surgeon: Evans Lance, MD;  Location: [facility];  Service: Cardiovascular;  Laterality: N/A;   TRANSTHORACIC ECHOCARDIOGRAM  09/13/2012   moderate LVH,  ef 60-65%/     TRANSURETHRAL RESECTION OF BLADDER TUMOR N/A 08/11/2016   Procedure: TRANSURETHRAL RESECTION OF BLADDER TUMOR (TURBT);  Surgeon: Cleon Gustin, MD;  [facility];  Service: Urology;  Laterality: N/A;    Allergies: Allergies  Allergen Reactions   Aricept [Donepezil]     dizziness   Levetiracetam Other (See Comments)    AMS   Penicillins Hives   Viagra [Sildenafil]     dizziness    Medications: (Not in a hospital admission)    Social History: Social History   Socioeconomic History   Marital status: Married  Spouse name: [name]   Number of children: 1   Years of education: College   Highest education level: Not on file  Occupational History   Occupation: Geophysicist/field seismologist  Tobacco Use   Smoking status: Former    Packs/day: 1.00    Years: 5.00    Pack years: 5.00    Types: Cigarettes    Quit date: 11/30/1981    Years since quitting: 39.5   Smokeless tobacco: Never  Vaping Use   Vaping Use: Never used  Substance and Sexual Activity   Alcohol use: No    Alcohol/week: 0.0 standard drinks    Comment: hx abuse -- quit:  1963   Drug use: No    Comment: hx abuse -- quit 1964 (iv heroin and cocaine   Sexual activity: Not on file  Other Topics Concern    Not on file  Social History Narrative   Patient lives at home with his spouse.   Caffeine Use:  Tea, lots   Social Determinants of Health   Financial Resource Strain: Not on file  Food Insecurity: Not on file  Transportation Needs: Not on file  Physical Activity: Not on file  Stress: Not on file  Social Connections: Not on file  Intimate Partner Violence: Not on file    Family History: Family History  Problem Relation Age of Onset   Rheum arthritis Mother    Diabetes Mother    Stroke Mother    Heart attack Mother    Kidney failure Mother    Heart attack Father    Heart disease Maternal Grandmother    Rheum arthritis Maternal Grandmother    Diabetes Maternal Grandmother    Stroke Maternal Grandmother    Colon cancer Neg Hx       Physical Exam: @VITALS2 @ General: No acute distress.  Awake. Head:  Normocephalic.  Atraumatic. ENT:  EOMI.  Mucous membranes moist Neck:  Supple.  No lymphadenopathy. CV:  Regular rate. Pulmonary: Equal effort bilaterally.   Abdomen: Soft.  Non-tender to palpation. Skin:  Normal turgor.  No visible rash. Extremity: No gross deformity of extremities.  Neurologic: Alert. Appropriate mood. Penis:  Uncircumcised.  No lesions. Urethra: Orthotopic meatus. Scrotum: No lesions.  No ecchymosis.  No erythema. Testicles: Descended bilaterally.  No masses bilaterally. Epididymis: Palpable bilaterally.  Non Tender to palpation.  Studies:  Recent Labs    06/26/21 0545  HGB 15.3  WBC 12.5*  PLT 227    Recent Labs    06/26/21 0545  NA 140  K 4.4  CL 102  CO2 24  BUN 18  CREATININE 1.76*  CALCIUM 10.1  GFRNONAA 39*     No results for input(s): INR, APTT in the last 72 hours.  Invalid input(s): PT   Invalid input(s): ABG  I independently reviewed the patient's [facility] as well as epic records  Nursing/physicians notes reviewed  Procedure note: After sterile prep and drape and using a large amount of K-Y jelly, 18  French coud tip catheter advanced into the bladder without difficulty.  Balloon filled with 10 cc of water, hooked to bag drainage.  Patient tolerated procedure well   Assessment: BPH with retention  Plan: I would leave catheter in until the patient ambulatory and stronger and then give voiding trial.  Reconsult urology as needed  Recommend follow-up at the [facility] medical clinics in [place]

## 2021-06-26 NOTE — Progress Notes (Signed)
Pharmacy Antibiotic Note  Dustin Hamilton is a 81 y.o. male admitted on 06/26/2021 with sepsis.  Pharmacy has been consulted for vancomycin and cefepime dosing. Pt is afebrile and WBC is elevated at 12.5. Lactic acid is significantly elevated at 4.5. Scr is also well above baseline at 1.76.   Plan: Vancomycin 1500mg  IV x 1 then 750mg  IV Q24H Cefepime 2gm IV Q12H F/u renal fxn, C&S, clinical status and peak/trough at SS  Height: 5\' 11"  (180.3 cm) Weight: 72.6 kg (160 lb) IBW/kg (Calculated) : 75.3  Temp (24hrs), Avg:97.5 F (36.4 C), Min:97.5 F (36.4 C), Max:97.5 F (36.4 C)  Recent Labs  Lab 06/26/21 0545  WBC 12.5*  CREATININE 1.76*  LATICACIDVEN 4.5*    Estimated Creatinine Clearance: 34.4 mL/min (A) (by C-G formula based on SCr of 1.76 mg/dL (H)).    Allergies  Allergen Reactions   Aricept [Donepezil]     dizziness   Levetiracetam Other (See Comments)    AMS   Penicillins Hives   Viagra [Sildenafil]     dizziness    Antimicrobials this admission: Vanc 1/25>> Cefepime 1/25>> Flagyl x 1 1/25  Dose adjustments this admission: N/A  Microbiology results: Pending  Thank you for allowing pharmacy to be a part of this patients care.  Antino Mayabb, Rande Lawman 06/26/2021 8:02 AM

## 2021-06-26 NOTE — ED Provider Triage Note (Signed)
Emergency Medicine Provider Triage Evaluation Note  Dustin Hamilton , a 81 y.o. male  was evaluated in triage.  Pt complains of no complaints. Denies pain. He does not recall events.    Per wife he was running a fever, recently dx w UTI. This morning was found on comode "incoherent" and refusing to get off the toilet. T max 100 last night. Family gave tylenol last night. Per family he was slumped over but had not fallen. No slurred speech per family. Has finished abx for UTI (aparently this is his second abx).   Family seems to not know much about his abx regimen. They say he was on oral vanc -- they say "maybe" when asked if he was having diarrhea.   Has dementia.   Review of Systems  Positive: AMS, fever  Negative: CP, abd pain  Physical Exam  BP 101/70 (BP Location: Left Arm)    Pulse 76    Temp (!) 97.5 F (36.4 C) (Oral)    Resp 18    SpO2 93%  Gen:   Awake, no distress, responds and AO x self.  Resp:  Normal effort  MSK:   Moves extremities without difficulty  Other:  Lungs clear. Grip symmetric. Smile symmetric. Somnolent but arousable. Head atraumatic.   Medical Decision Making  Medically screening exam initiated at 5:38 AM.  Appropriate orders placed.  Dustin Hamilton was informed that the remainder of the evaluation will be completed by another provider, this initial triage assessment does not replace that evaluation, and the importance of remaining in the ED until their evaluation is complete.  Initially triaged as a syncope patient he seems to be more of an altered mental status patient per wife who is on her way to the ER.  We discussed his symptoms he did not fall was found slumped over on the toilet.  Apparently has had recent fever although wife is uncertain of exact temperature but thinks it may have been 100.  Recent antibiotic questionable C. difficile infection given that he was on oral vancomycin per wife.  Overall very unclear history from both wife and  patient.   Dustin Hamilton, [place] 06/26/21 [phone]

## 2021-06-26 NOTE — ED Notes (Signed)
Patient transported to CT 

## 2021-06-26 NOTE — ED Notes (Signed)
Lactic acid 4.5

## 2021-06-26 NOTE — ED Notes (Signed)
Bladder scan- 549mL

## 2021-06-26 NOTE — H&P (Signed)
History and Physical    Dustin Hamilton MGQ:[id number] DOB: 1940/07/12 DOA: 06/26/2021  PCP: [facility] (Confirm with patient/family/NH records and if not entered, this has to be entered at [facility] point of entry) Patient coming from: Home  I have personally briefly reviewed patient's old medical records in [place]  Chief Complaint: Feeling better  HPI: Dustin Hamilton is a 81 y.o. male with medical history significant of recent C. 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  Patient was recently hospitalized and diagnosed with C. difficile completed a total of 10 days p.o. vancomycin 2 weeks ago.  Patient reported diarrhea subsided.  However yesterday evening, patient started to have watery diarrhea again, he said they were not as foul-smelled as those c. Diff diarrhea he had three weeks ago. Las night, he had 3 episodes before midnight, and patient started to have episode of chills, but no abdominal pain no nauseous vomiting.  He did feel lightheadedness when he stands up on the toilet but denies any fall or loss of consciousness.  Family called EMS great morning, EMS arrived and found patient with a low-grade fever 100.0, and patient was given Tylenol x1. ED Course: Patient was found afebrile, no tachycardia, blood pressure on the low side.  Patient was confused.  CT chest abdomen pelvis with contrast showed no dissection but question of 1.7 cm right renal mass suspicious for malignancy.  And distended bladder.  ED tried to place in the Foley failed x2.  Urology consulted, input a coud Foley catheter and drained 600 mm of urine.  UA showed 6-10 WBC/hpf.  WBC 12.5, lactic acid 4.5> two-point 2:08 and half liters IV bolus.  Patient was given vancomycin and cefepime and Flagyl in the ED.  And patient mentation much improved after IV  hydration.  Review of Systems: As per HPI otherwise 14 point review of systems negative.    Past Medical History:  Diagnosis Date   BPH (benign prostatic hyperplasia)    Cerebrovascular disease    Clostridium difficile diarrhea    Congenital anomaly of diaphragm    Elevated PSA    Glaucoma, both eyes    Hemorrhoid    Hepatitis B surface antigen positive    02-20-2011   History of adenomatous polyp of colon    2007, 2009 and 2013  tubular adenoma's   History of alcohol abuse    quit 1963   History of cerebral parenchymal hemorrhage    01/ 2006  left occiptial lobe related to hypertensive crisis   History of CVA (cerebrovascular accident)    09-12-2012  left hippocampus/ amygdala junction and per MRI old white matter infarcts--  per pt residual short- term memory issues   History of fatty infiltration of liver hx visit's at [facility] , last visit 05/ 2014   elvated LFT's ,  via liver bx 2004 related to hx alcohol and drug abuse (quit 1964)   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   HTN (hypertension)    Renal cyst, left    Stroke ([place])    hx of 3 strokes in past    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    Urethral lesion    urethral mass    Past Surgical History:  Procedure Laterality Date   CARDIOVASCULAR STRESS TEST  05/05/2007  normal nuclear study w/ no ischemia/  normal LV fucntion and wall motion , ef60%   COLONOSCOPY  last one 04-06-2012   CYSTO/  LEFT RETROGRADE PYELOGRAM/ CYTOLOGY WASHINGS/  URETEROSCOPY  03/05/2000   INGUINAL HERNIA REPAIR Bilateral 1965 and 1980's   LAPAROSCOPIC INGUINAL HERNIA WITH UMBILICAL HERNIA Right 31/49/7026   LIVER BIOPSY  1980's and 2004   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   Procedure: PACEMAKER IMPLANT;  Surgeon: Evans Lance, MD;  Location: [facility];  Service: Cardiovascular;  Laterality: N/A;   SVT ABLATION N/A 11/15/2018   Procedure: SVT ABLATION;   Surgeon: Evans Lance, MD;  Location: [facility];  Service: Cardiovascular;  Laterality: N/A;   TRANSTHORACIC ECHOCARDIOGRAM  09/13/2012   moderate LVH,  ef 60-65%/     TRANSURETHRAL RESECTION OF BLADDER TUMOR N/A 08/11/2016   Procedure: TRANSURETHRAL RESECTION OF BLADDER TUMOR (TURBT);  Surgeon: Cleon Gustin, MD;  Location: [facility];  Service: Urology;  Laterality: N/A;     reports that he quit smoking about 39 years ago. His smoking use included cigarettes. He has a 5.00 pack-year smoking history. He has never used smokeless tobacco. He reports that he does not drink alcohol and does not use drugs.  Allergies  Allergen Reactions   Aricept [Donepezil]     dizziness   Levetiracetam Other (See Comments)    AMS   Penicillins Hives   Viagra [Sildenafil]     dizziness    Family History  Problem Relation Age of Onset   Rheum arthritis Mother    Diabetes Mother    Stroke Mother    Heart attack Mother    Kidney failure Mother    Heart attack Father    Heart disease Maternal Grandmother    Rheum arthritis Maternal Grandmother    Diabetes Maternal Grandmother    Stroke Maternal Grandmother    Colon cancer Neg Hx     Prior to Admission medications   Medication Sig Start Date End Date Taking? Authorizing Provider  acetaminophen (TYLENOL) 500 MG tablet Take 1,000 mg by mouth every 6 (six) hours as needed for fever or mild pain.   Yes [provider]  allopurinol (ZYLOPRIM) 100 MG tablet Take 1 tablet (100 mg total) by mouth daily. 04/05/21 04/05/22 Yes Pokhrel, Laxman, MD  atorvastatin (LIPITOR) 40 MG tablet Take 0.5 tablets (20 mg total) by mouth daily. 04/05/21  Yes Pokhrel, Laxman, MD  brimonidine (ALPHAGAN) 0.2 % ophthalmic solution Place 1 drop into both eyes 2 (two) times daily.   Yes [provider]  diclofenac Sodium (VOLTAREN) 1 % GEL Apply 4 g topically 4 (four) times daily. Patient taking differently: Apply 4 g topically 4  (four) times daily as needed (pain). 06/05/21  Yes Deno Etienne, DO  dutasteride (AVODART) 0.5 MG capsule Take 0.5 mg by mouth daily.   Yes [provider]  latanoprost (XALATAN) 0.005 % ophthalmic solution Place 1 drop into both eyes at bedtime.    Yes [provider]  lidocaine (LIDODERM) 5 % Place 1 patch onto the skin daily as needed (back pain). Remove & Discard patch within 12 hours or as directed by MD   Yes [provider]  memantine (NAMENDA) 10 MG tablet Take 20 mg by mouth at bedtime.   Yes [provider]  methocarbamol (ROBAXIN) 500 MG tablet Take 1 tablet (500 mg total) by mouth 2 (two) times daily. Patient taking differently: Take 500 mg by mouth 2 (two) times  daily as needed for muscle spasms. 06/05/21  Yes Deno Etienne, DO  metoprolol succinate (TOPROL-XL) 25 MG 24 hr tablet Take 0.5 tablets (12.5 mg total) by mouth at bedtime. 05/31/20  Yes Lilland, Alana, DO  Naphazoline HCl (CLEAR EYES OP) Apply 2 drops to eye daily as needed (dry eyes).   Yes [provider]  Nutritional Supplements ([place] PO) Take 1 Bottle by mouth daily as needed (appetite, nutrition).   Yes [provider]  tamsulosin (FLOMAX) 0.4 MG CAPS capsule Take 0.4 mg by mouth at bedtime.   Yes [provider]  tiZANidine (ZANAFLEX) 4 MG tablet Take 4 mg by mouth 2 (two) times daily as needed for muscle spasms. 06/11/21  Yes [provider]    Physical Exam: Vitals:   06/26/21 1115 06/26/21 1130 06/26/21 1200 06/26/21 1345  BP: (!) 171/103 (!) 129/53 128/61 (!) 149/76  Pulse: (!) 36 (!) 31 (!) 32 94  Resp: 19 17 17  (!) 23  Temp:      TempSrc:      SpO2: 91% 96% 95% 97%  Weight:      Height:        Constitutional: NAD, calm, comfortable Vitals:   06/26/21 1115 06/26/21 1130 06/26/21 1200 06/26/21 1345  BP: (!) 171/103 (!) 129/53 128/61 (!) 149/76  Pulse: (!) 36 (!) 31 (!) 32 94  Resp: 19 17 17  (!) 23  Temp:      TempSrc:       SpO2: 91% 96% 95% 97%  Weight:      Height:       Eyes: PERRL, lids and conjunctivae normal ENMT: Mucous membranes are dry. Posterior pharynx clear of any exudate or lesions.Normal dentition.  Neck: normal, supple, no masses, no thyromegaly Respiratory: clear to auscultation bilaterally, no wheezing, no crackles. Normal respiratory effort. No accessory muscle use.  Cardiovascular: Regular rate and rhythm, no murmurs / rubs / gallops. No extremity edema. 2+ pedal pulses. No carotid bruits.  Abdomen: no tenderness, no masses palpated. No hepatosplenomegaly. Bowel sounds positive.  Musculoskeletal: no clubbing / cyanosis. No joint deformity upper and lower extremities. Good ROM, no contractures. Normal muscle tone.  Skin: no rashes, lesions, ulcers. No induration Neurologic: CN 2-12 grossly intact. Sensation intact, DTR normal. Strength 5/5 in all 4.  Psychiatric: Normal judgment and insight. Alert and oriented x 3. Normal mood.     Labs on Admission: I have personally reviewed following labs and imaging studies  CBC: Recent Labs  Lab 06/26/21 0545  WBC 12.5*  NEUTROABS 11.3*  HGB 15.3  HCT 47.6  MCV 96.4  PLT 563   Basic Metabolic Panel: Recent Labs  Lab 06/26/21 0545  NA 140  K 4.4  CL 102  CO2 24  GLUCOSE 140*  BUN 18  CREATININE 1.76*  CALCIUM 10.1   GFR: Estimated Creatinine Clearance: 34.4 mL/min (A) (by C-G formula based on SCr of 1.76 mg/dL (H)). Liver Function Tests: Recent Labs  Lab 06/26/21 0545  AST 27  ALT 50*  ALKPHOS 61  BILITOT 0.9  PROT 7.9  ALBUMIN 3.3*   No results for input(s): LIPASE, AMYLASE in the last 168 hours. No results for input(s): AMMONIA in the last 168 hours. Coagulation Profile: No results for input(s): INR, PROTIME in the last 168 hours. Cardiac Enzymes: No results for input(s): CKTOTAL, CKMB, CKMBINDEX, TROPONINI in the last 168 hours. BNP (last 3 results) No results for input(s): PROBNP in the last 8760  hours. HbA1C: No results  for input(s): HGBA1C in the last 72 hours. CBG: Recent Labs  Lab 06/26/21 0744  GLUCAP 132*   Lipid Profile: No results for input(s): CHOL, HDL, LDLCALC, TRIG, CHOLHDL, LDLDIRECT in the last 72 hours. Thyroid Function Tests: No results for input(s): TSH, T4TOTAL, FREET4, T3FREE, THYROIDAB in the last 72 hours. Anemia Panel: No results for input(s): VITAMINB12, FOLATE, FERRITIN, TIBC, IRON, RETICCTPCT in the last 72 hours. Urine analysis:    Component Value Date/Time   COLORURINE YELLOW 06/26/2021 1259   APPEARANCEUR HAZY (A) 06/26/2021 1259   LABSPEC 1.025 06/26/2021 1259   PHURINE 5.0 06/26/2021 1259   GLUCOSEU NEGATIVE 06/26/2021 1259   GLUCOSEU NEGATIVE 02/17/2011 0930   HGBUR LARGE (A) 06/26/2021 1259   BILIRUBINUR NEGATIVE 06/26/2021 1259   KETONESUR NEGATIVE 06/26/2021 1259   PROTEINUR NEGATIVE 06/26/2021 1259   UROBILINOGEN 1.0 09/12/2012 1802   NITRITE NEGATIVE 06/26/2021 1259   LEUKOCYTESUR TRACE (A) 06/26/2021 1259    Radiological Exams on Admission: DG Chest 2 View  Result Date: 06/26/2021 CLINICAL DATA:  Syncope. EXAM: CHEST - 2 VIEW COMPARISON:  Portable chest 05/31/2021. FINDINGS: The cardiac size is normal. Stable mediastinum with aortic tortuosity, patchy calcific plaque. No vascular congestion is seen. There is chronic elevation right hemidiaphragm with underlying atelectasis/scarring in the right lower lobe. The lungs clear of focal infiltrate. No pleural effusion is seen. Thoracic spondylosis. Overall recent seems unchanged. Again noted is a left chest dual lead pacing system with stable wire insertions. IMPRESSION: No active cardiopulmonary disease.  Stable chest. Electronically Signed   By: Telford Nab M.D.   On: 06/26/2021 06:30   CT HEAD WO CONTRAST (5MM)  Result Date: 06/26/2021 CLINICAL DATA:  81 year old male with history of syncopal episode at home. Head trauma. EXAM: CT HEAD WITHOUT CONTRAST TECHNIQUE: Contiguous axial  images were obtained from the base of the skull through the vertex without intravenous contrast. RADIATION DOSE REDUCTION: This exam was performed according to the departmental dose-optimization program which includes automated exposure control, adjustment of the mA and/or kV according to patient size and/or use of iterative reconstruction technique. COMPARISON:  Head CT 06/05/2021. FINDINGS: Brain: Mild cerebral atrophy with ex vacuo dilatation of the ventricular system. Patchy and confluent areas of decreased attenuation are noted throughout the deep and periventricular white matter of the cerebral hemispheres bilaterally, compatible with chronic microvascular ischemic disease. No evidence of acute infarction, hemorrhage, hydrocephalus, extra-axial collection or mass lesion/mass effect. Vascular: No hyperdense vessel or unexpected calcification. Skull: Normal. Negative for fracture or focal lesion. Sinuses/Orbits: No acute finding. Other: None. IMPRESSION: 1. No evidence of significant acute traumatic injury to the skull or brain. 2. Mild cerebral atrophy with extensive chronic microvascular ischemic changes in the cerebral white matter, similar to the prior study, as above. Electronically Signed   By: Vinnie Langton M.D.   On: 06/26/2021 07:19   CT Cervical Spine Wo Contrast  Result Date: 06/26/2021 CLINICAL DATA:  Syncope, neck pain EXAM: CT CERVICAL SPINE WITHOUT CONTRAST TECHNIQUE: Multidetector CT imaging of the cervical spine was performed without intravenous contrast. Multiplanar CT image reconstructions were also generated. RADIATION DOSE REDUCTION: This exam was performed according to the departmental dose-optimization program which includes automated exposure control, adjustment of the mA and/or kV according to patient size and/or use of iterative reconstruction technique. COMPARISON:  06/05/2021 FINDINGS: Alignment: Alignment of posterior margins of vertebral bodies is unremarkable. Skull base and  vertebrae: No recent fracture is seen. Soft tissues and spinal canal: There is extrinsic pressure over the ventral  margin of thecal sac caused by posterior bony spurs and spinal stenosis at C5-C6 and C6-C7 levels. Disc levels: Degenerative changes are noted with bony spurs from C2-C7 levels. There is encroachment of neural foramina by bony spurs and facet hypertrophy from C2 to C7 levels, more so at C5-C6 and C6-C7 levels. Upper chest: Unremarkable. Other: Thyroid is enlarged. IMPRESSION: No recent fracture is seen. 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 Enlarged thyroid. Electronically Signed   By: Elmer Picker M.D.   On: 06/26/2021 12:51   CT Angio Chest/Abd/Pel for Dissection W and/or Wo Contrast  Result Date: 06/26/2021 CLINICAL DATA:  Syncope.  Acute aortic syndrome. EXAM: CT ANGIOGRAPHY CHEST, ABDOMEN AND PELVIS TECHNIQUE: Non-contrast CT of the chest was initially obtained. Multidetector CT imaging through the chest, abdomen and pelvis was performed using the standard protocol during bolus administration of intravenous contrast. Multiplanar reconstructed images and MIPs were obtained and reviewed to evaluate the vascular anatomy. RADIATION DOSE REDUCTION: This exam was performed according to the departmental dose-optimization program which includes automated exposure control, adjustment of the mA and/or kV according to patient size and/or use of iterative reconstruction technique. CONTRAST:  37mL OMNIPAQUE IOHEXOL 350 MG/ML SOLN COMPARISON:  December 04, 2019. FINDINGS: CTA CHEST FINDINGS Cardiovascular: Preferential opacification of the thoracic aorta. No evidence of thoracic aortic aneurysm or dissection. Normal heart size. No pericardial effusion. Mediastinum/Nodes: Stable diffuse thyroid enlargement. No adenopathy is noted. The esophagus is unremarkable. Lungs/Pleura: No pneumothorax or pleural effusion is noted. Mild  bibasilar subsegmental atelectasis is noted. Musculoskeletal: No chest wall abnormality. No acute or significant osseous findings. Review of the MIP images confirms the above findings. CTA ABDOMEN AND PELVIS FINDINGS VASCULAR Aorta: Atherosclerosis of abdominal aorta is noted without aneurysm or dissection. Celiac: Patent without evidence of aneurysm, dissection, vasculitis or significant stenosis. SMA: Patent without evidence of aneurysm, dissection, vasculitis or significant stenosis. Renals: Both renal arteries are patent without evidence of aneurysm, dissection, vasculitis, fibromuscular dysplasia or significant stenosis. IMA: Patent without evidence of aneurysm, dissection, vasculitis or significant stenosis. Inflow: Patent without evidence of aneurysm, dissection, vasculitis or significant stenosis. Veins: No obvious venous abnormality within the limitations of this arterial phase study. Review of the MIP images confirms the above findings. NON-VASCULAR Hepatobiliary: No focal liver abnormality is seen. No gallstones, gallbladder wall thickening, or biliary dilatation. Pancreas: Unremarkable. No pancreatic ductal dilatation or surrounding inflammatory changes. Spleen: Normal in size without focal abnormality. Adrenals/Urinary Tract: Adrenal glands appear normal. Left renal cyst is noted. 1.7 cm possibly enhancing right renal lesion is noted. No hydronephrosis or renal obstruction is noted. Severely enlarged prostate gland is noted which is seen extending into the inferior posterior aspect of the urinary bladder. Stomach/Bowel: Stomach is within normal limits. Appendix appears normal. No evidence of bowel wall thickening, distention, or inflammatory changes. Lymphatic: No adenopathy is noted. Reproductive: As noted above, severely enlarged prostate gland is noted which extends into the posterior and inferior aspect of the urinary bladder. Neoplasm cannot be excluded. Cystoscopy may be performed for further  evaluation. Other: No abdominal wall hernia or abnormality. No abdominopelvic ascites. Musculoskeletal: No acute or significant osseous findings. Review of the MIP images confirms the above findings. IMPRESSION: No evidence of thoracic or abdominal aortic dissection or aneurysm. No definite evidence of mesenteric or renal artery stenosis. 1.7 cm possibly enhancing right renal lesion is noted. MRI with and without gadolinium is recommended to rule out potential neoplasm. Severely  enlarged prostate gland is noted which extends into the posteroinferior aspect of the urinary bladder. Neoplasm cannot be excluded. Cystoscopy may be performed for further evaluation. Aortic Atherosclerosis (ICD10-I70.0). Electronically Signed   By: Marijo Conception M.D.   On: 06/26/2021 08:58    EKG: Independently reviewed.  Paced.  Assessment/Plan Principal Problem:   Sepsis ([place])  (please populate well all problems here in Problem List. (For example, if patient is on BP meds at home and you resume or decide to hold them, it is a problem that needs to be her. Same for CAD, COPD, HLD and so on)  SIRS -D/W ID about treatment plan, as per Dr. Gale Journey, given there is no severe symptoms such as high fever and leukocytosis and severe sepsis, not recommending treating or retesting C. difficile.  Differential likely can be post C. difficile infection IBS.  We will treat dehydration and continue supportive care but not antibiotics. -UA not sufficient diagnosed UTI at this point. ID recommend hold off antibiotics.  AKI -Secondary to GI loss from the diarrhea and postrenal of acute urinary retention from BPH, continue IV fluid.  Acute urinary retention -Secondary to BPH, on Foley, likely will be discharged home with Foley and outpatient follow-up with [facility] - Closed urology.  Acute metabolic encephalopathy -Secondary to AKI and dehydration, much improved after IV hydration.  Hold off antibiotics as mentioned above.  CT head reassuring.  Near  syncope -Likely from dehydration and hypovolemia secondary to diarrhea, rehydration and PT evaluation for tomorrow.  Right kidney mass -Discussed with patient and his wife at bedside, recommend outpatient [place] urology follow-up and outpatient MRI.  HTN -Continue low-dose of metoprolol  History of SVTs on PPM -No active issue.  DVT prophylaxis: Heparin subcu Code Status: Full code Family Communication: Wife at bedside Disposition Plan: Elderly patient came with multiple complaints with AKI, ongoing diarrhea, expect more than 2 midnight hospital stay. Consults called: ID, reconsult if needed Admission status: Telemetry admission   Lequita Halt MD [facility] Pager [phone]  06/26/2021, 2:46 PM

## 2021-06-26 NOTE — ED Triage Notes (Signed)
Patient arrived with EMS from home family reports syncopal episode this morning found sitting on the toilet seat , patient unable to recall incident , somnolent at arrival/poor historian .

## 2021-06-26 NOTE — ED Provider Notes (Signed)
[facility] EMERGENCY DEPARTMENT Provider Note   CSN: [id number] Arrival date & time: 06/26/21  0518     History  Chief Complaint  Patient presents with   Syncope    Dustin Hamilton is a 81 y.o. male.  HPI  81 year old male with past medical history significant for hypertension, CVA, BPH, C. difficile diarrhea diagnosed 1 month ago, treated with oral antibiotics who presents emergency department with somnolence and altered mental status.  History is limited as the patient was unable to provide much history.  Per EMS, the patient was coming from home where family reported a syncopal episode where the patient was found sitting on the toilet seat.  The patient is unable to recall the incident and was somnolent on arrival.  Per the patient's wife, he was found incoherent on the toilet earlier this morning.  Family gave him Tylenol last night for fever.  No falls or head trauma he was just slumped over the toilet per wife.  No slurred speech or neurologic deficits noted per family.  He recently finished antibiotics for C difficile infection.  His wife is not bedside to provide additional history but was available in triage and subsequently came bedside to provide additional history.  He has been on oral vancomycin for C. difficile diarrhea diagnosed at the end of December. His diarrhea initially improved but has since recurred. Last night, he developed profuse watery diarrhea, around 3 episodes.  Home Medications Prior to Admission medications   Medication Sig Start Date End Date Taking? Authorizing Provider  acetaminophen (TYLENOL) 500 MG tablet Take 1,000 mg by mouth every 6 (six) hours as needed for fever or mild pain.   Yes [provider]  allopurinol (ZYLOPRIM) 100 MG tablet Take 1 tablet (100 mg total) by mouth daily. 04/05/21 04/05/22 Yes Pokhrel, Laxman, MD  atorvastatin (LIPITOR) 40 MG tablet Take 0.5 tablets (20 mg total) by mouth daily. 04/05/21  Yes  Pokhrel, Laxman, MD  brimonidine (ALPHAGAN) 0.2 % ophthalmic solution Place 1 drop into both eyes 2 (two) times daily.   Yes [provider]  diclofenac Sodium (VOLTAREN) 1 % GEL Apply 4 g topically 4 (four) times daily. Patient taking differently: Apply 4 g topically 4 (four) times daily as needed (pain). 06/05/21  Yes Deno Etienne, DO  dutasteride (AVODART) 0.5 MG capsule Take 0.5 mg by mouth daily.   Yes [provider]  latanoprost (XALATAN) 0.005 % ophthalmic solution Place 1 drop into both eyes at bedtime.    Yes [provider]  lidocaine (LIDODERM) 5 % Place 1 patch onto the skin daily as needed (back pain). Remove & Discard patch within 12 hours or as directed by MD   Yes [provider]  memantine (NAMENDA) 10 MG tablet Take 20 mg by mouth at bedtime.   Yes [provider]  methocarbamol (ROBAXIN) 500 MG tablet Take 1 tablet (500 mg total) by mouth 2 (two) times daily. Patient taking differently: Take 500 mg by mouth 2 (two) times daily as needed for muscle spasms. 06/05/21  Yes Deno Etienne, DO  metoprolol succinate (TOPROL-XL) 25 MG 24 hr tablet Take 0.5 tablets (12.5 mg total) by mouth at bedtime. 05/31/20  Yes Lilland, Alana, DO  Naphazoline HCl (CLEAR EYES OP) Apply 2 drops to eye daily as needed (dry eyes).   Yes [provider]  Nutritional Supplements ([place] PO) Take 1 Bottle by mouth daily as needed (appetite, nutrition).   Yes [provider]  tamsulosin (FLOMAX) 0.4 MG CAPS capsule Take 0.4 mg by mouth at bedtime.   Yes [provider]  tiZANidine (ZANAFLEX) 4 MG tablet Take 4 mg by mouth 2 (two) times daily as needed for muscle spasms. 06/11/21  Yes [provider]      Allergies    Aricept [donepezil], Levetiracetam, Penicillins, and Viagra [sildenafil]    Review of Systems   Review of Systems  Unable to perform ROS: Mental status change  All other systems reviewed and are  negative.  Physical Exam Updated Vital Signs BP 128/61    Pulse (!) 32    Temp (!) 97.5 F (36.4 C) (Oral)    Resp 17    Ht 5\' 11"  (1.803 m)    Wt 72.6 kg    SpO2 95%    BMI 22.32 kg/m  Physical Exam Vitals and nursing note reviewed.  Constitutional:      General: He is not in acute distress.    Comments: Somnolent but arousable, GCS 13, AAO x3 when aroused  HENT:     Head: Normocephalic and atraumatic.     Mouth/Throat:     Mouth: Mucous membranes are dry.  Eyes:     Conjunctiva/sclera: Conjunctivae normal.     Pupils: Pupils are equal, round, and reactive to light.  Cardiovascular:     Rate and Rhythm: Normal rate and regular rhythm.  Pulmonary:     Effort: Pulmonary effort is normal. No respiratory distress.     Breath sounds: Normal breath sounds.  Abdominal:     General: There is no distension.     Tenderness: There is no abdominal tenderness. There is no guarding.  Musculoskeletal:        General: No deformity or signs of injury.     Cervical back: Neck supple.  Skin:    Findings: No lesion or rash.  Neurological:     General: No focal deficit present.     Mental Status: He is oriented to person, place, and time. Mental status is at baseline.     Cranial Nerves: No cranial nerve deficit.     Sensory: No sensory deficit.     Motor: No weakness.    ED Results / Procedures / Treatments   Labs (all labs ordered are listed, but only abnormal results are displayed) Labs Reviewed  LACTIC ACID, PLASMA - Abnormal; Notable for the following components:      Result Value   Lactic Acid, Venous 4.5 (*)    All other components within normal limits  LACTIC ACID, PLASMA - Abnormal; Notable for the following components:   Lactic Acid, Venous 2.8 (*)    All other components within normal limits  BRAIN NATRIURETIC PEPTIDE - Abnormal; Notable for the following components:   B Natriuretic Peptide 118.5 (*)    All other components within normal limits  CBC WITH  DIFFERENTIAL/PLATELET - Abnormal; Notable for the following components:   WBC 12.5 (*)    Neutro Abs 11.3 (*)    Lymphs Abs 0.5 (*)    All other components within normal limits  COMPREHENSIVE METABOLIC PANEL - Abnormal; Notable for the following components:   Glucose, Bld 140 (*)    Creatinine, Ser 1.76 (*)    Albumin 3.3 (*)    ALT 50 (*)    GFR, Estimated 39 (*)    All other components within normal limits  URINALYSIS, ROUTINE W REFLEX MICROSCOPIC - Abnormal; Notable for the following components:   APPearance HAZY (*)  Hgb urine dipstick LARGE (*)    Leukocytes,Ua TRACE (*)    Bacteria, UA RARE (*)    All other components within normal limits  CBG MONITORING, ED - Abnormal; Notable for the following components:   Glucose-Capillary 132 (*)    All other components within normal limits  RESP PANEL BY RT-PCR (FLU A&B, COVID) [id number]  CULTURE, BLOOD (ROUTINE X 2)  CULTURE, BLOOD (ROUTINE X 2)  URINE CULTURE  LACTIC ACID, PLASMA  LACTIC ACID, PLASMA  TROPONIN I (HIGH SENSITIVITY)  TROPONIN I (HIGH SENSITIVITY)    EKG EKG Interpretation  Date/Time:  Wednesday June 26 2021 05:23:49 EST Ventricular Rate:  75 PR Interval:  270 QRS Duration: 152 QT Interval:  434 QTC Calculation: 484 R Axis:   -84 Text Interpretation: Atrial-sensed ventricular-paced rhythm with prolonged AV conduction Abnormal ECG When compared with ECG of 30-May-2021 16:53, PREVIOUS ECG IS PRESENT Confirmed by Regan Lemming ([id number]) on 06/26/2021 8:32:24 AM  Radiology DG Chest 2 View  Result Date: 06/26/2021 CLINICAL DATA:  Syncope. EXAM: CHEST - 2 VIEW COMPARISON:  Portable chest 05/31/2021. FINDINGS: The cardiac size is normal. Stable mediastinum with aortic tortuosity, patchy calcific plaque. No vascular congestion is seen. There is chronic elevation right hemidiaphragm with underlying atelectasis/scarring in the right lower lobe. The lungs clear of focal infiltrate. No pleural effusion is seen. Thoracic  spondylosis. Overall recent seems unchanged. Again noted is a left chest dual lead pacing system with stable wire insertions. IMPRESSION: No active cardiopulmonary disease.  Stable chest. Electronically Signed   By: Telford Nab M.D.   On: 06/26/2021 06:30   CT HEAD WO CONTRAST (5MM)  Result Date: 06/26/2021 CLINICAL DATA:  81 year old male with history of syncopal episode at home. Head trauma. EXAM: CT HEAD WITHOUT CONTRAST TECHNIQUE: Contiguous axial images were obtained from the base of the skull through the vertex without intravenous contrast. RADIATION DOSE REDUCTION: This exam was performed according to the departmental dose-optimization program which includes automated exposure control, adjustment of the mA and/or kV according to patient size and/or use of iterative reconstruction technique. COMPARISON:  Head CT 06/05/2021. FINDINGS: Brain: Mild cerebral atrophy with ex vacuo dilatation of the ventricular system. Patchy and confluent areas of decreased attenuation are noted throughout the deep and periventricular white matter of the cerebral hemispheres bilaterally, compatible with chronic microvascular ischemic disease. No evidence of acute infarction, hemorrhage, hydrocephalus, extra-axial collection or mass lesion/mass effect. Vascular: No hyperdense vessel or unexpected calcification. Skull: Normal. Negative for fracture or focal lesion. Sinuses/Orbits: No acute finding. Other: None. IMPRESSION: 1. No evidence of significant acute traumatic injury to the skull or brain. 2. Mild cerebral atrophy with extensive chronic microvascular ischemic changes in the cerebral white matter, similar to the prior study, as above. Electronically Signed   By: Vinnie Langton M.D.   On: 06/26/2021 07:19   CT Cervical Spine Wo Contrast  Result Date: 06/26/2021 CLINICAL DATA:  Syncope, neck pain EXAM: CT CERVICAL SPINE WITHOUT CONTRAST TECHNIQUE: Multidetector CT imaging of the cervical spine was performed without  intravenous contrast. Multiplanar CT image reconstructions were also generated. RADIATION DOSE REDUCTION: This exam was performed according to the departmental dose-optimization program which includes automated exposure control, adjustment of the mA and/or kV according to patient size and/or use of iterative reconstruction technique. COMPARISON:  06/05/2021 FINDINGS: Alignment: Alignment of posterior margins of vertebral bodies is unremarkable. Skull base and vertebrae: No recent fracture is seen. Soft tissues and spinal canal: There is extrinsic pressure over the ventral margin of  thecal sac caused by posterior bony spurs and spinal stenosis at C5-C6 and C6-C7 levels. Disc levels: Degenerative changes are noted with bony spurs from C2-C7 levels. There is encroachment of neural foramina by bony spurs and facet hypertrophy from C2 to C7 levels, more so at C5-C6 and C6-C7 levels. Upper chest: Unremarkable. Other: Thyroid is enlarged. IMPRESSION: No recent fracture is seen. 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 Enlarged thyroid. Electronically Signed   By: Elmer Picker M.D.   On: 06/26/2021 12:51   CT Angio Chest/Abd/Pel for Dissection W and/or Wo Contrast  Result Date: 06/26/2021 CLINICAL DATA:  Syncope.  Acute aortic syndrome. EXAM: CT ANGIOGRAPHY CHEST, ABDOMEN AND PELVIS TECHNIQUE: Non-contrast CT of the chest was initially obtained. Multidetector CT imaging through the chest, abdomen and pelvis was performed using the standard protocol during bolus administration of intravenous contrast. Multiplanar reconstructed images and MIPs were obtained and reviewed to evaluate the vascular anatomy. RADIATION DOSE REDUCTION: This exam was performed according to the departmental dose-optimization program which includes automated exposure control, adjustment of the mA and/or kV according to patient size and/or use of iterative  reconstruction technique. CONTRAST:  37mL OMNIPAQUE IOHEXOL 350 MG/ML SOLN COMPARISON:  December 04, 2019. FINDINGS: CTA CHEST FINDINGS Cardiovascular: Preferential opacification of the thoracic aorta. No evidence of thoracic aortic aneurysm or dissection. Normal heart size. No pericardial effusion. Mediastinum/Nodes: Stable diffuse thyroid enlargement. No adenopathy is noted. The esophagus is unremarkable. Lungs/Pleura: No pneumothorax or pleural effusion is noted. Mild bibasilar subsegmental atelectasis is noted. Musculoskeletal: No chest wall abnormality. No acute or significant osseous findings. Review of the MIP images confirms the above findings. CTA ABDOMEN AND PELVIS FINDINGS VASCULAR Aorta: Atherosclerosis of abdominal aorta is noted without aneurysm or dissection. Celiac: Patent without evidence of aneurysm, dissection, vasculitis or significant stenosis. SMA: Patent without evidence of aneurysm, dissection, vasculitis or significant stenosis. Renals: Both renal arteries are patent without evidence of aneurysm, dissection, vasculitis, fibromuscular dysplasia or significant stenosis. IMA: Patent without evidence of aneurysm, dissection, vasculitis or significant stenosis. Inflow: Patent without evidence of aneurysm, dissection, vasculitis or significant stenosis. Veins: No obvious venous abnormality within the limitations of this arterial phase study. Review of the MIP images confirms the above findings. NON-VASCULAR Hepatobiliary: No focal liver abnormality is seen. No gallstones, gallbladder wall thickening, or biliary dilatation. Pancreas: Unremarkable. No pancreatic ductal dilatation or surrounding inflammatory changes. Spleen: Normal in size without focal abnormality. Adrenals/Urinary Tract: Adrenal glands appear normal. Left renal cyst is noted. 1.7 cm possibly enhancing right renal lesion is noted. No hydronephrosis or renal obstruction is noted. Severely enlarged prostate gland is noted which is seen  extending into the inferior posterior aspect of the urinary bladder. Stomach/Bowel: Stomach is within normal limits. Appendix appears normal. No evidence of bowel wall thickening, distention, or inflammatory changes. Lymphatic: No adenopathy is noted. Reproductive: As noted above, severely enlarged prostate gland is noted which extends into the posterior and inferior aspect of the urinary bladder. Neoplasm cannot be excluded. Cystoscopy may be performed for further evaluation. Other: No abdominal wall hernia or abnormality. No abdominopelvic ascites. Musculoskeletal: No acute or significant osseous findings. Review of the MIP images confirms the above findings. IMPRESSION: No evidence of thoracic or abdominal aortic dissection or aneurysm. No definite evidence of mesenteric or renal artery stenosis. 1.7 cm possibly enhancing right renal lesion is noted. MRI with and without gadolinium is recommended to rule out potential neoplasm. Severely enlarged prostate  gland is noted which extends into the posteroinferior aspect of the urinary bladder. Neoplasm cannot be excluded. Cystoscopy may be performed for further evaluation. Aortic Atherosclerosis (ICD10-I70.0). Electronically Signed   By: Marijo Conception M.D.   On: 06/26/2021 08:58    Procedures .Critical Care Performed by: Regan Lemming, MD Authorized by: Regan Lemming, MD   Critical care provider statement:    Critical care time (minutes):  30   Critical care was necessary to treat or prevent imminent or life-threatening deterioration of the following conditions:  Dehydration   Critical care was time spent personally by me on the following activities:  Development of treatment plan with patient or surrogate, discussions with consultants, evaluation of patient's response to treatment, examination of patient, ordering and review of laboratory studies, ordering and review of radiographic studies, ordering and performing treatments and interventions, pulse  oximetry, re-evaluation of patient's condition and review of old charts   Care discussed with: admitting provider      Medications Ordered in ED Medications  lactated ringers infusion (has no administration in time range)  lactated ringers bolus 1,000 mL (0 mLs Intravenous Stopped 06/26/21 0949)    And  lactated ringers bolus 1,000 mL (0 mLs Intravenous Stopped 06/26/21 1031)    And  lactated ringers bolus 250 mL (has no administration in time range)  ceFEPIme (MAXIPIME) 2 g in sodium chloride 0.9 % 100 mL IVPB (has no administration in time range)  vancomycin (VANCOREADY) IVPB 750 mg/150 mL (has no administration in time range)  metroNIDAZOLE (FLAGYL) IVPB 500 mg (0 mg Intravenous Stopped 06/26/21 0954)  ceFEPIme (MAXIPIME) 2 g in sodium chloride 0.9 % 100 mL IVPB (0 g Intravenous Stopped 06/26/21 1103)  vancomycin (VANCOREADY) IVPB 1500 mg/300 mL (1,500 mg Intravenous New Bag/Given 06/26/21 1120)  iohexol (OMNIPAQUE) 350 MG/ML injection 80 mL (80 mLs Intravenous Contrast Given 06/26/21 0832)  fentaNYL (SUBLIMAZE) injection 25 mcg (25 mcg Intravenous Given 06/26/21 1009)  fentaNYL (SUBLIMAZE) injection 25 mcg (25 mcg Intravenous Given 06/26/21 1120)    ED Course/ Medical Decision Making/ A&P Clinical Course as of 06/26/21 1336  Wed Jun 26, 2021  0850 WBC(!): 12.5 [JL]  0850 Glucose-Capillary(!): 132 [JL]    Clinical Course User Index [JL] Regan Lemming, MD                           Medical Decision Making Amount and/or Complexity of Data Reviewed Labs: ordered. Decision-making details documented in ED Course. Radiology: ordered.  Risk Prescription drug management. Decision regarding hospitalization.   81 year old male with past medical history significant for hypertension, CVA, BPH, C. difficile diarrhea diagnosed 1 month ago, treated with oral antibiotics who presents emergency department with somnolence and altered mental status.  History is limited as the patient was unable to  provide much history.  Per EMS, the patient was coming from home where family reported a syncopal episode where the patient was found sitting on the toilet seat.  The patient is unable to recall the incident and was somnolent on arrival.  Per the patient's wife, he was found incoherent on the toilet earlier this morning.  Family gave him Tylenol last night for fever.  No falls or head trauma he was just slumped over the toilet per wife.  No slurred speech or neurologic deficits noted per family.  He recently finished antibiotics for C difficile infection.  His wife is not bedside to provide additional history but was available in triage and  subsequently came bedside to provide additional history.  He has been on oral vancomycin for C. difficile diarrhea diagnosed at the end of December. His diarrhea initially improved but has since recurred. Last night, he developed profuse watery diarrhea, around 3 episodes.  On arrival, the patient was afebrile, temperature 97.5, not tachycardic or tachypneic, hemodynamically stable with a blood pressure of 101/70, saturating 93% on room air.  He presents with somnolence in the setting of recent reported urinary tract infection, recent remote history of C. difficile diarrhea.  His beaks membranes were dry on physical exam.  He had no neurologic deficit on arrival.  Concern for possible hypovolemia versus sepsis given the patient's reported history.  Code sepsis initiated on my evaluation.  Patient did have initial lactic acid elevated to 4.5 from triage.  He has a leukocytosis to 12.5.  He was covered with broad-spectrum antibiotics to include vancomycin, Flagyl, cefepime.  He appears to have an AKI with a creatinine of 1.76 from baseline around 1.  He appears to be hypovolemic, possibly from recent diarrhea or poor p.o. intake.  Volume resuscitation was initiated with 30 cc/kg of IV fluids.  The patient denies any chest pain or abdominal pain at this time but is an unreliable  historian given his somnolence.  Will evaluate for dissection rule out with CTA chest abdomen pelvis.  CTA was performed and was negative for acute dissection or other abnormality.  No evidence for PE.  The following incidental findings were noted:   IMPRESSION:  No evidence of thoracic or abdominal aortic dissection or aneurysm.  No definite evidence of mesenteric or renal artery stenosis.     1.7 cm possibly enhancing right renal lesion is noted. MRI with and  without gadolinium is recommended to rule out potential neoplasm.     Severely enlarged prostate gland is noted which extends into the  posteroinferior aspect of the urinary bladder. Neoplasm cannot be  excluded. Cystoscopy may be performed for further evaluation.     Aortic Atherosclerosis (ICD10-I70.0).       On reevaluation, the patient's mental status had improved following initial IV fluid resuscitation.  He did state that he had had about 3 episodes of profuse watery diarrhea last night.  His mental status significantly improved following volume resuscitation.  Attempts were made to pass a Foley catheter.  He had obstruction noted with urinary retention present with 600 cc in his bladder.  Nursing staff attempted to pass a Foley and subsequently attempted to pass a coud which was unsuccessful.  Urology was consulted and ultimately was able to successfully pass a catheter into the patient's bladder for urine specimen.  The patient's lactic acidosis improved from 4.5-2.8 following volume resuscitation.  His CBG was notably 132.  He had a BNP of 118, leukocytosis to 12.5 noted, evidence of an AKI with a creatinine of 1.76 up from a baseline of 1.  His COVID-19 and influenza PCR testing was negative.  He had blood cultures that were collected and pending.  His CT head and CT cervical spine were without acute intracranial abnormality and without evidence of traumatic injury to the cervical spine or skull.  He had negative cardiac troponins  and does not complain of chest pain. No evidence of PE on CTA. Low suspicion for cardiogenic etiology of the patient's presentation. Suspect likely hypovolemia in the setting of recurrent diarrheal illness with possibility of recurrence of his C. difficile infection.  Given the patient's altered mental status, hypovolemia, in the setting of  a diarrheal illness, hospitalist medicine was consulted for admission.   Final Clinical Impression(s) / ED Diagnoses Final diagnoses:  AKI (acute kidney injury) ([place])  Lactic acidosis  Urinary retention  Somnolence  Syncope, unspecified syncope type    Rx / DC Orders ED Discharge Orders     None         Regan Lemming, MD 06/26/21 1336

## 2021-06-26 NOTE — ED Notes (Signed)
Urology cart at bedside 

## 2021-06-26 NOTE — Sepsis Progress Note (Signed)
eLink monitoring code sepsis.  

## 2021-06-27 LAB — CBC
HCT: 38.4 % — ABNORMAL LOW (ref 39.0–52.0)
Hemoglobin: 13 g/dL (ref 13.0–17.0)
MCH: 31.4 pg (ref 26.0–34.0)
MCHC: 33.9 g/dL (ref 30.0–36.0)
MCV: 92.8 fL (ref 80.0–100.0)
Platelets: 187 10*3/uL (ref 150–400)
RBC: 4.14 MIL/uL — ABNORMAL LOW (ref 4.22–5.81)
RDW: 14 % (ref 11.5–15.5)
WBC: 7.1 10*3/uL (ref 4.0–10.5)
nRBC: 0 % (ref 0.0–0.2)

## 2021-06-27 LAB — BASIC METABOLIC PANEL
Anion gap: 6 (ref 5–15)
BUN: 13 mg/dL (ref 8–23)
CO2: 26 mmol/L (ref 22–32)
Calcium: 9.2 mg/dL (ref 8.9–10.3)
Chloride: 106 mmol/L (ref 98–111)
Creatinine, Ser: 1.04 mg/dL (ref 0.61–1.24)
GFR, Estimated: >60 mL/min (ref >60)
Glucose, Bld: 102 mg/dL — ABNORMAL HIGH (ref 70–99)
Potassium: 3.5 mmol/L (ref 3.5–5.1)
Sodium: 138 mmol/L (ref 135–145)

## 2021-06-27 LAB — URINE CULTURE: Culture: NO GROWTH

## 2021-06-27 LAB — LACTIC ACID, PLASMA: Lactic Acid, Venous: 1.4 mmol/L (ref 0.5–1.9)

## 2021-06-27 MED ORDER — ENOXAPARIN SODIUM 40 MG/0.4ML IJ SOSY
40.0000 mg | PREFILLED_SYRINGE | INTRAMUSCULAR | Status: DC
  Administered 2021-06-27 – 2021-06-30 (×3): 40 mg via SUBCUTANEOUS
  Filled 2021-06-27 (×3): qty 0.4

## 2021-06-27 MED ORDER — CHLORHEXIDINE GLUCONATE CLOTH 2 % EX PADS
6.0000 | MEDICATED_PAD | Freq: Every day | CUTANEOUS | Status: DC
  Administered 2021-06-27 – 2021-07-01 (×5): 6 via TOPICAL

## 2021-06-27 MED ORDER — LACTATED RINGERS IV BOLUS
500.0000 mL | Freq: Once | INTRAVENOUS | Status: AC
  Administered 2021-06-27: 500 mL via INTRAVENOUS

## 2021-06-27 MED ORDER — RINGERS IV SOLN: INTRAVENOUS | Status: DC

## 2021-06-27 MED ORDER — SACCHAROMYCES BOULARDII 250 MG PO CAPS
250.0000 mg | ORAL_CAPSULE | Freq: Two times a day (BID) | ORAL | Status: DC
  Administered 2021-06-27 – 2021-06-28 (×3): 250 mg via ORAL
  Filled 2021-06-27 (×6): qty 1

## 2021-06-27 NOTE — Progress Notes (Signed)
PROGRESS NOTE    Dustin Hamilton  MOQ:[id number] DOB: Jan 16, 1941 DOA: 06/26/2021 PCP: [facility]   Brief Narrative: 81 year old with past medical history significant for recent C. difficile colitis, BPH, advanced dementia, glaucoma, gout, PSVT status post ablation, PPM, chronic back pain who presents with recurrent diarrhea and low-grade fever.  Recently completed 10 days course of p.o. vancomycin 2 weeks prior to this admission.  Patient reported diarrhea has subsided.  However he started to have watery diarrhea again, not as foul smell as with C diff.  In the ED patient was found to be afebrile.  He did have a low-grade fever when EMS was called.  CT abdomen and pelvis with contrast showed no dissection.  Question of 1.7 cm right renal mass suspicious for malignancy, distended bladder, urology was consulted for difficult to place Foley catheter.  Patient had cloudy Foley catheter placed by urology.  He was also found to have lactic acidosis, lactic acid up to 4.5.  He received IV bolus.  Assessment & Plan:   Principal Problem:   Sepsis ([place]) Active Problems:   AKI (acute kidney injury) ([place])   1-SIRS;  Presents with tachycardia, leukocytosis, low grade fever.  Follow Blood cultures Chest x ray no active cardiopulmonary diseases.  UA; 6-10 WBC. Follow urine culture.  Hold on IV antibiotics.  Case discussed with ID, hold on checking C diff. If parent develops frequent watery stool will re test.   AKI;  Obstructive uropathy possible BPH Cr peak to 1.7 Improved with IV fluids.  Urology placed foley catheter.  Voiding trial when more active.   Diarrhea:  Prior history of C diff.  Had two loose episode today.  Continue with IV fluids.  If watery stool, will check for C diff.    Acute Metabolic encephalopathy:  CT head: no acute finding.  Resolved.   Near syncope.  Related to hypovolemia.  Continue with IV fluids.   History of SVT,  S/P PPM./ Ablation.   Continue Metoprolol.  Lactic acidosis;  Related to hypovolemia, hypoperfusion. Resolved with IV fluids.   Right Renal Mass;  Needs to follow up with urology/[facility].   Estimated body mass index is 22.32 kg/m as calculated from the following:   Height as of this encounter: 5\' 11"  (1.803 m).   Weight as of this encounter: 72.6 kg.   DVT prophylaxis: Lovenox Code Status: Full code Family Communication: Care discussed with patient and wife who was at bedside.  Disposition Plan:  Status is: Inpatient  Remains inpatient appropriate because: required IV fluids, monitor for fever and diarrhea.         Consultants:  Urology   Procedures:  None  Antimicrobials:  None  Subjective: He is feeling a little better today./  He denies abdominal pain.  He report two loose stool episode.   Objective: Vitals:   06/27/21 0200 06/27/21 0230 06/27/21 0300 06/27/21 0500  BP: (!) 138/91 (!) 171/79 (!) 153/83 (!) 135/95  Pulse: 70 60 60 73  Resp: 17 15 16 14   Temp:      TempSrc:      SpO2: 96% 98% 98% 98%  Weight:      Height:        Intake/Output Summary (Last 24 hours) at 06/27/2021 0740 Last data filed at 06/27/2021 0612 Gross per 24 hour  Intake 4341.8 ml  Output 2000 ml  Net 2341.8 ml   Filed Weights   06/26/21 0745  Weight: 72.6 kg    Examination:  General  exam: Appears calm and comfortable  Respiratory system: Clear to auscultation. Respiratory effort normal. Cardiovascular system: S1 & S2 heard, RRR. No JVD, murmurs, rubs, gallops or clicks. No pedal edema. Gastrointestinal system: Abdomen is nondistended, soft and nontender. No organomegaly or masses felt. Normal bowel sounds heard. Central nervous system: Alert and oriented. No focal neurological deficits. Extremities: Symmetric 5 x 5 power.    Data Reviewed: I have personally reviewed following labs and imaging studies  CBC: Recent Labs  Lab 06/26/21 0545 06/27/21 0500  WBC 12.5* 7.1  NEUTROABS 11.3*   --   HGB 15.3 13.0  HCT 47.6 38.4*  MCV 96.4 92.8  PLT 227 284   Basic Metabolic Panel: Recent Labs  Lab 06/26/21 0545 06/27/21 0500  NA 140 138  K 4.4 3.5  CL 102 106  CO2 24 26  GLUCOSE 140* 102*  BUN 18 13  CREATININE 1.76* 1.04  CALCIUM 10.1 9.2   GFR: Estimated Creatinine Clearance: 58.2 mL/min (by C-G formula based on SCr of 1.04 mg/dL). Liver Function Tests: Recent Labs  Lab 06/26/21 0545  AST 27  ALT 50*  ALKPHOS 61  BILITOT 0.9  PROT 7.9  ALBUMIN 3.3*   No results for input(s): LIPASE, AMYLASE in the last 168 hours. No results for input(s): AMMONIA in the last 168 hours. Coagulation Profile: No results for input(s): INR, PROTIME in the last 168 hours. Cardiac Enzymes: No results for input(s): CKTOTAL, CKMB, CKMBINDEX, TROPONINI in the last 168 hours. BNP (last 3 results) No results for input(s): PROBNP in the last 8760 hours. HbA1C: No results for input(s): HGBA1C in the last 72 hours. CBG: Recent Labs  Lab 06/26/21 0744  GLUCAP 132*   Lipid Profile: No results for input(s): CHOL, HDL, LDLCALC, TRIG, CHOLHDL, LDLDIRECT in the last 72 hours. Thyroid Function Tests: No results for input(s): TSH, T4TOTAL, FREET4, T3FREE, THYROIDAB in the last 72 hours. Anemia Panel: No results for input(s): VITAMINB12, FOLATE, FERRITIN, TIBC, IRON, RETICCTPCT in the last 72 hours. Sepsis Labs: Recent Labs  Lab 06/26/21 0545 06/26/21 0800 06/26/21 1308  LATICACIDVEN 4.5* 2.8* 3.3*    Recent Results (from the past 240 hour(s))  Blood culture (routine x 2)     Status: None (Preliminary result)   Collection Time: 06/26/21  5:45 AM   Specimen: BLOOD RIGHT HAND  Result Value Ref Range Status   Specimen Description BLOOD RIGHT HAND  Final   Special Requests   Final    BOTTLES DRAWN AEROBIC AND ANAEROBIC Blood Culture adequate volume   Culture   Final    NO GROWTH 1 DAY Performed at [facility], [place] [address], [place], [place] [address]    Report  Status PENDING  Incomplete  Blood culture (routine x 2)     Status: None (Preliminary result)   Collection Time: 06/26/21  6:01 AM   Specimen: BLOOD  Result Value Ref Range Status   Specimen Description BLOOD LEFT ANTECUBITAL  Final   Special Requests AEROBIC BOTTLE ONLY Blood Culture adequate volume  Final   Culture   Final    NO GROWTH 1 DAY Performed at [facility], [place] [address] [place], [place] [address]    Report Status PENDING  Incomplete  Resp Panel by RT-PCR (Flu A&B, Covid) Nasopharyngeal Swab     Status: None   Collection Time: 06/26/21  6:24 AM   Specimen: Nasopharyngeal Swab; Nasopharyngeal(NP) swabs in vial transport medium  Result Value Ref Range Status   SARS Coronavirus  2 by RT PCR NEGATIVE NEGATIVE Final    Comment: (NOTE) SARS-CoV-2 target nucleic acids are NOT DETECTED.  The SARS-CoV-2 RNA is generally detectable in upper respiratory specimens during the acute phase of infection. The lowest concentration of SARS-CoV-2 viral copies this assay can detect is 138 copies/mL. A negative result does not preclude SARS-Cov-2 infection and should not be used as the sole basis for treatment or other patient management decisions. A negative result may occur with  improper specimen collection/handling, submission of specimen other than nasopharyngeal swab, presence of viral mutation(s) within the areas targeted by this assay, and inadequate number of viral copies(<138 copies/mL). A negative result must be combined with clinical observations, patient history, and epidemiological information. The expected result is Negative.  Fact Sheet for Patients:  EntrepreneurPulse.com.au  Fact Sheet for Healthcare Providers:  IncredibleEmployment.be  This test is no t yet approved or cleared by the [place] FDA and  has been authorized for detection and/or diagnosis of SARS-CoV-2 by FDA under an Emergency Use Authorization (EUA). This  EUA will remain  in effect (meaning this test can be used) for the duration of the COVID-19 declaration under Section 564(b)(1) of the Act, 21 U.S.C.section 360bbb-3(b)(1), unless the authorization is terminated  or revoked sooner.       Influenza A by PCR NEGATIVE NEGATIVE Final   Influenza B by PCR NEGATIVE NEGATIVE Final    Comment: (NOTE) The Xpert Xpress SARS-CoV-2/FLU/RSV plus assay is intended as an aid in the diagnosis of influenza from Nasopharyngeal swab specimens and should not be used as a sole basis for treatment. Nasal washings and aspirates are unacceptable for Xpert Xpress SARS-CoV-2/FLU/RSV testing.  Fact Sheet for Patients: EntrepreneurPulse.com.au  Fact Sheet for Healthcare Providers: IncredibleEmployment.be  This test is not yet approved or cleared by the [place] FDA and has been authorized for detection and/or diagnosis of SARS-CoV-2 by FDA under an Emergency Use Authorization (EUA). This EUA will remain in effect (meaning this test can be used) for the duration of the COVID-19 declaration under Section 564(b)(1) of the Act, 21 U.S.C. section 360bbb-3(b)(1), unless the authorization is terminated or revoked.  Performed at [facility], [place] [address] [place], [place] [address]          Radiology Studies: DG Chest 2 View  Result Date: 06/26/2021 CLINICAL DATA:  Syncope. EXAM: CHEST - 2 VIEW COMPARISON:  Portable chest 05/31/2021. FINDINGS: The cardiac size is normal. Stable mediastinum with aortic tortuosity, patchy calcific plaque. No vascular congestion is seen. There is chronic elevation right hemidiaphragm with underlying atelectasis/scarring in the right lower lobe. The lungs clear of focal infiltrate. No pleural effusion is seen. Thoracic spondylosis. Overall recent seems unchanged. Again noted is a left chest dual lead pacing system with stable wire insertions. IMPRESSION: No active cardiopulmonary  disease.  Stable chest. Electronically Signed   By: Telford Nab M.D.   On: 06/26/2021 06:30   CT HEAD WO CONTRAST (5MM)  Result Date: 06/26/2021 CLINICAL DATA:  81 year old male with history of syncopal episode at home. Head trauma. EXAM: CT HEAD WITHOUT CONTRAST TECHNIQUE: Contiguous axial images were obtained from the base of the skull through the vertex without intravenous contrast. RADIATION DOSE REDUCTION: This exam was performed according to the departmental dose-optimization program which includes automated exposure control, adjustment of the mA and/or kV according to patient size and/or use of iterative reconstruction technique. COMPARISON:  Head CT 06/05/2021. FINDINGS: Brain: Mild cerebral atrophy with ex vacuo dilatation of the ventricular system.  Patchy and confluent areas of decreased attenuation are noted throughout the deep and periventricular white matter of the cerebral hemispheres bilaterally, compatible with chronic microvascular ischemic disease. No evidence of acute infarction, hemorrhage, hydrocephalus, extra-axial collection or mass lesion/mass effect. Vascular: No hyperdense vessel or unexpected calcification. Skull: Normal. Negative for fracture or focal lesion. Sinuses/Orbits: No acute finding. Other: None. IMPRESSION: 1. No evidence of significant acute traumatic injury to the skull or brain. 2. Mild cerebral atrophy with extensive chronic microvascular ischemic changes in the cerebral white matter, similar to the prior study, as above. Electronically Signed   By: Vinnie Langton M.D.   On: 06/26/2021 07:19   CT Cervical Spine Wo Contrast  Result Date: 06/26/2021 CLINICAL DATA:  Syncope, neck pain EXAM: CT CERVICAL SPINE WITHOUT CONTRAST TECHNIQUE: Multidetector CT imaging of the cervical spine was performed without intravenous contrast. Multiplanar CT image reconstructions were also generated. RADIATION DOSE REDUCTION: This exam was performed according to the departmental  dose-optimization program which includes automated exposure control, adjustment of the mA and/or kV according to patient size and/or use of iterative reconstruction technique. COMPARISON:  06/05/2021 FINDINGS: Alignment: Alignment of posterior margins of vertebral bodies is unremarkable. Skull base and vertebrae: No recent fracture is seen. Soft tissues and spinal canal: There is extrinsic pressure over the ventral margin of thecal sac caused by posterior bony spurs and spinal stenosis at C5-C6 and C6-C7 levels. Disc levels: Degenerative changes are noted with bony spurs from C2-C7 levels. There is encroachment of neural foramina by bony spurs and facet hypertrophy from C2 to C7 levels, more so at C5-C6 and C6-C7 levels. Upper chest: Unremarkable. Other: Thyroid is enlarged. IMPRESSION: No recent fracture is seen. 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 Enlarged thyroid. Electronically Signed   By: Elmer Picker M.D.   On: 06/26/2021 12:51   CT Angio Chest/Abd/Pel for Dissection W and/or Wo Contrast  Result Date: 06/26/2021 CLINICAL DATA:  Syncope.  Acute aortic syndrome. EXAM: CT ANGIOGRAPHY CHEST, ABDOMEN AND PELVIS TECHNIQUE: Non-contrast CT of the chest was initially obtained. Multidetector CT imaging through the chest, abdomen and pelvis was performed using the standard protocol during bolus administration of intravenous contrast. Multiplanar reconstructed images and MIPs were obtained and reviewed to evaluate the vascular anatomy. RADIATION DOSE REDUCTION: This exam was performed according to the departmental dose-optimization program which includes automated exposure control, adjustment of the mA and/or kV according to patient size and/or use of iterative reconstruction technique. CONTRAST:  107mL OMNIPAQUE IOHEXOL 350 MG/ML SOLN COMPARISON:  December 04, 2019. FINDINGS: CTA CHEST FINDINGS Cardiovascular: Preferential  opacification of the thoracic aorta. No evidence of thoracic aortic aneurysm or dissection. Normal heart size. No pericardial effusion. Mediastinum/Nodes: Stable diffuse thyroid enlargement. No adenopathy is noted. The esophagus is unremarkable. Lungs/Pleura: No pneumothorax or pleural effusion is noted. Mild bibasilar subsegmental atelectasis is noted. Musculoskeletal: No chest wall abnormality. No acute or significant osseous findings. Review of the MIP images confirms the above findings. CTA ABDOMEN AND PELVIS FINDINGS VASCULAR Aorta: Atherosclerosis of abdominal aorta is noted without aneurysm or dissection. Celiac: Patent without evidence of aneurysm, dissection, vasculitis or significant stenosis. SMA: Patent without evidence of aneurysm, dissection, vasculitis or significant stenosis. Renals: Both renal arteries are patent without evidence of aneurysm, dissection, vasculitis, fibromuscular dysplasia or significant stenosis. IMA: Patent without evidence of aneurysm, dissection, vasculitis or significant stenosis. Inflow: Patent without evidence of aneurysm, dissection, vasculitis or significant stenosis. Veins: No obvious  venous abnormality within the limitations of this arterial phase study. Review of the MIP images confirms the above findings. NON-VASCULAR Hepatobiliary: No focal liver abnormality is seen. No gallstones, gallbladder wall thickening, or biliary dilatation. Pancreas: Unremarkable. No pancreatic ductal dilatation or surrounding inflammatory changes. Spleen: Normal in size without focal abnormality. Adrenals/Urinary Tract: Adrenal glands appear normal. Left renal cyst is noted. 1.7 cm possibly enhancing right renal lesion is noted. No hydronephrosis or renal obstruction is noted. Severely enlarged prostate gland is noted which is seen extending into the inferior posterior aspect of the urinary bladder. Stomach/Bowel: Stomach is within normal limits. Appendix appears normal. No evidence of bowel  wall thickening, distention, or inflammatory changes. Lymphatic: No adenopathy is noted. Reproductive: As noted above, severely enlarged prostate gland is noted which extends into the posterior and inferior aspect of the urinary bladder. Neoplasm cannot be excluded. Cystoscopy may be performed for further evaluation. Other: No abdominal wall hernia or abnormality. No abdominopelvic ascites. Musculoskeletal: No acute or significant osseous findings. Review of the MIP images confirms the above findings. IMPRESSION: No evidence of thoracic or abdominal aortic dissection or aneurysm. No definite evidence of mesenteric or renal artery stenosis. 1.7 cm possibly enhancing right renal lesion is noted. MRI with and without gadolinium is recommended to rule out potential neoplasm. Severely enlarged prostate gland is noted which extends into the posteroinferior aspect of the urinary bladder. Neoplasm cannot be excluded. Cystoscopy may be performed for further evaluation. Aortic Atherosclerosis (ICD10-I70.0). Electronically Signed   By: Marijo Conception M.D.   On: 06/26/2021 08:58        Scheduled Meds:  acidophilus  2 capsule Oral TID   allopurinol  100 mg Oral Daily   atorvastatin  20 mg Oral Daily   brimonidine  1 drop Both Eyes BID   dutasteride  0.5 mg Oral Daily   enoxaparin (LOVENOX) injection  30 mg Subcutaneous Q24H   latanoprost  1 drop Both Eyes QHS   memantine  20 mg Oral QHS   metoprolol succinate  12.5 mg Oral QHS   tamsulosin  0.4 mg Oral QHS   Continuous Infusions:  lactated ringers     ringers       LOS: 1 day    Time spent: 35 minutes.     Elmarie Shiley, MD [facility]   If 7PM-7AM, please contact night-coverage [email or web address]  06/27/2021, 7:40 AM

## 2021-06-27 NOTE — Evaluation (Signed)
Occupational Therapy Evaluation Patient Details Name: Dustin Hamilton MRN: [id number] DOB: 1940/10/27 Today's Date: 06/27/2021   History of Present Illness Pt is an 81 y.o. admitted 06/26/21 with recurrent diarrhea, low-grade fever. 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 diff); acute metabolic encephalopathy secondary to AKI and dehydration. Abdominal CT suspecious for R renal mass malignancy. 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   Clinical Impression   PTA, pt was living with his wife and required assistance for ADLs due to baseline dementia; pt with assistance from aide in mornings, niece at night, and wife throughout the day. Pt uses a rollator for functional mobility. Pt performing UB ADLs with Mod A, LB ADLs with Max A, and functional mobility with Min Guard A +2 and RW.  Pt presenting with decreased activity tolerance and balance. Pt would benefit from further acute OT to facilitate safe dc. Recommend dc to home with HHOT for further OT to optimize safety, independence with ADLs, and return to PLOF.       Recommendations for follow up therapy are one component of a multi-disciplinary discharge planning process, led by the attending physician.  Recommendations may be updated based on patient status, additional functional criteria and insurance authorization.   Follow Up Recommendations  Home health OT    Assistance Recommended at Discharge Frequent or constant Supervision/Assistance  Patient can return home with the following A little help with walking and/or transfers;A lot of help with bathing/dressing/bathroom;Assistance with cooking/housework;Assist for transportation    Functional Status Assessment  Patient has had a recent decline in their functional status and demonstrates the ability to make significant improvements in function in a reasonable and  predictable amount of time.  Equipment Recommendations  None recommended by OT    Recommendations for Other Services PT consult     Precautions / Restrictions Precautions Precautions: Fall Precaution Comments: enteric      Mobility Bed Mobility Overal bed mobility: Needs Assistance Bed Mobility: Supine to Sit, Sit to Supine     Supine to sit: Min guard Sit to supine: Min guard   General bed mobility comments: Min Guard A for safety    Transfers Overall transfer level: Needs assistance Equipment used: Rolling walker (2 wheels) Transfers: Sit to/from Stand Sit to Stand: Min guard           General transfer comment: MIn Guard A for safety. posterior leane      Balance Overall balance assessment: Needs assistance Sitting-balance support: No upper extremity supported, Feet supported Sitting balance-Leahy Scale: Fair     Standing balance support: Bilateral upper extremity supported, During functional activity Standing balance-Leahy Scale: Poor                             ADL either performed or assessed with clinical judgement   ADL Overall ADL's : Needs assistance/impaired Eating/Feeding: Set up;Sitting;Cueing for sequencing   Grooming: Set up;Supervision/safety;Sitting;Cueing for sequencing   Upper Body Bathing: Moderate assistance;Sitting   Lower Body Bathing: Maximal assistance;Sit to/from stand   Upper Body Dressing : Moderate assistance;Sitting   Lower Body Dressing: Maximal assistance;Sit to/from stand Lower Body Dressing Details (indicate cue type and reason): don socks Toilet Transfer: +2 for safety/equipment;Ambulation;Rolling walker (2 wheels);Min guard Toilet Transfer Details (indicate cue type and reason): Min Guard A for safety with slight posterior lean  Functional mobility during ADLs: Min guard;Rolling walker (2 wheels) General ADL Comments: Decreased activity tolerance     Vision         Perception      Praxis      Pertinent Vitals/Pain Pain Assessment Pain Assessment: No/denies pain     Hand Dominance Right   Extremity/Trunk Assessment Upper Extremity Assessment Upper Extremity Assessment: Generalized weakness   Lower Extremity Assessment Lower Extremity Assessment: Generalized weakness   Cervical / Trunk Assessment Cervical / Trunk Assessment: Kyphotic   Communication Communication Communication: No difficulties   Cognition Arousal/Alertness: Awake/alert Behavior During Therapy: WFL for tasks assessed/performed Overall Cognitive Status: History of cognitive impairments - at baseline                                 General Comments: Wife reports he isnt quite his normal self     General Comments  Wife present. VSS on RA    Exercises     Shoulder Instructions      Home Living Family/patient expects to be discharged to:: Private residence Living Arrangements: Spouse/significant other Available Help at Discharge: Family;Available 24 hours/day Type of Home: House Home Access: Ramped entrance     Home Layout: Two level;Full bath on main level;Bed/bath upstairs;Able to live on main level with bedroom/bathroom     Bathroom Shower/Tub: Walk-in shower;Door   [employer] Toilet: Standard Bathroom Accessibility: Yes   Home Equipment: Advice worker (2 wheels);Rollator (4 wheels);Cane - single point;Wheelchair - manual;BSC/3in1   Additional Comments: aide for three hours in the morning and niece comes in the evening to assist      Prior Functioning/Environment Prior Level of Function : Needs assist  Cognitive Assist : ADLs (cognitive)   ADLs (Cognitive): Step by step cues       Mobility Comments: uses a rollator ADLs Comments: Aid assist in the morning with bathing and some dressing.        OT Problem List: Impaired balance (sitting and/or standing);Decreased range of motion;Decreased activity tolerance;Decreased  strength;Decreased knowledge of use of DME or AE;Decreased knowledge of precautions      OT Treatment/Interventions: Self-care/ADL training;Therapeutic exercise;Energy conservation;Therapeutic activities;Patient/family education    OT Goals(Current goals can be found in the care plan section) Acute Rehab OT Goals Patient Stated Goal: Go home OT Goal Formulation: With patient Time For Goal Achievement: 07/11/21 Potential to Achieve Goals: Good  OT Frequency: Min 2X/week    Co-evaluation PT/OT/SLP Co-Evaluation/Treatment: Yes Reason for Co-Treatment: For patient/therapist safety;To address functional/ADL transfers   OT goals addressed during session: ADL's and self-care      AM-PAC OT "6 Clicks" Daily Activity     Outcome Measure Help from another person eating meals?: A Little Help from another person taking care of personal grooming?: A Little Help from another person toileting, which includes using toliet, bedpan, or urinal?: A Lot Help from another person bathing (including washing, rinsing, drying)?: A Lot Help from another person to put on and taking off regular upper body clothing?: A Lot Help from another person to put on and taking off regular lower body clothing?: A Lot 6 Click Score: 14   End of Session Equipment Utilized During Treatment: Rolling walker (2 wheels);Gait belt Nurse Communication: Mobility status  Activity Tolerance: Patient tolerated treatment well Patient left: with call bell/phone within reach;in bed;with family/visitor present  OT Visit Diagnosis: Unsteadiness on feet (R26.81);Other abnormalities of gait and mobility (R26.89);Muscle weakness (generalized) (M62.81)  Time: 7183-6725 OT Time Calculation (min): 21 min Charges:  OT General Charges $OT Visit: 1 Visit OT Evaluation $OT Eval Moderate Complexity: [place], OTR/L Acute Rehab Pager: [phone] Office: Early 06/27/2021, 11:23  AM

## 2021-06-27 NOTE — Evaluation (Signed)
Physical Therapy Evaluation Patient Details Name: Dustin Hamilton MRN: [id number] DOB: [birth year] Today's Date: 06/27/2021  History of Present Illness  Pt is an 81 y.o. admitted 06/26/21 with recurrent diarrhea, low-grade fever. 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 diff); acute metabolic encephalopathy secondary to AKI and dehydration. Abdominal CT suspecious for R renal mass malignancy. 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   Clinical Impression  Pt presents with an overall decrease in functional mobility secondary to above. PTA, ambulatory with rollator, lives with wife; pt has aide and family assist with ADL/iADLs due to pt's baseline dementia; pt recently d/c home from hospital and was working with [facility] services. Today, pt able to transfer and ambulate short distance with RW and min guard for balance. [name] wife present during session; reports no DME needs. Pt would benefit from continued acute PT services to maximize functional mobility and independence prior to d/c with continued HHPT services.      Recommendations for follow up therapy are one component of a multi-disciplinary discharge planning process, led by the attending physician.  Recommendations may be updated based on patient status, additional functional criteria and insurance authorization.  Follow Up Recommendations Home health PT    Assistance Recommended at Discharge Frequent or constant Supervision/Assistance  Patient can return home with the following  A little help with bathing/dressing/bathroom;Assistance with cooking/housework;Assist for transportation;Help with stairs or ramp for entrance;Direct supervision/assist for medications management;Direct supervision/assist for financial management    Equipment Recommendations None recommended by PT  Recommendations for Other Services        Functional Status Assessment Patient has had a recent decline in their functional status and demonstrates the ability to make significant improvements in function in a reasonable and predictable amount of time.     Precautions / Restrictions Precautions Precautions: Fall Precaution Comments: enteric Restrictions Weight Bearing Restrictions: No      Mobility  Bed Mobility Overal bed mobility: Needs Assistance Bed Mobility: Supine to Sit, Sit to Supine     Supine to sit: Supervision Sit to supine: Supervision   General bed mobility comments: supervision for safety on ED stretcher    Transfers Overall transfer level: Needs assistance Equipment used: Rolling walker (2 wheels) Transfers: Sit to/from Stand Sit to Stand: Min guard           General transfer comment: Initial stand with LOB and uncontrolled descent back to sitting EOB; additional 2-3x stand from ED stretcher to RW with min guard for balance, pt opts to pull on walker with BUEs    Ambulation/Gait Ambulation/Gait assistance: Min guard Gait Distance (Feet): 56 Feet Assistive device: Rolling walker (2 wheels) Gait Pattern/deviations: Step-through pattern, Decreased stride length, Trunk flexed Gait velocity: Decreased     General Gait Details: Slow, mildly unsteady gait with RW and intermittent min guard for balance; forward flexed posture requiring frequent verbal cues to correct  Stairs            Wheelchair Mobility    Modified Rankin (Stroke Patients Only)       Balance Overall balance assessment: Needs assistance Sitting-balance support: No upper extremity supported, Feet supported Sitting balance-Leahy Scale: Fair     Standing balance support: Bilateral upper extremity supported, During functional activity Standing balance-Leahy Scale: Poor  Pertinent Vitals/Pain      Home Living Family/patient expects to be discharged to:: Private  residence Living Arrangements: Spouse/significant other Available Help at Discharge: Family;Available 24 hours/day Type of Home: House Home Access: Ramped entrance       Home Layout: Two level;Full bath on main level;Bed/bath upstairs;Able to live on main level with bedroom/bathroom Home Equipment: Shower seat;Rolling Walker (2 wheels);Rollator (4 wheels);Cane - single point;Wheelchair - manual;BSC/3in1 Additional Comments: aide for three hours in the morning and niece comes in the evening to assist    Prior Function Prior Level of Function : Needs assist  Cognitive Assist : ADLs (cognitive)   ADLs (Cognitive): Step by step cues       Mobility Comments: uses a rollator ADLs Comments: Aid assist in the morning with bathing and some dressing.     Hand Dominance   Dominant Hand: Right    Extremity/Trunk Assessment   Upper Extremity Assessment Upper Extremity Assessment: Generalized weakness    Lower Extremity Assessment Lower Extremity Assessment: Generalized weakness    Cervical / Trunk Assessment Cervical / Trunk Assessment: Kyphotic  Communication   Communication: No difficulties  Cognition                                       General Comments: Wife reports he isnt quite his normal self ("he's more sleepy"); pt pleasant and agreeable, answering majority of his home questions appropriately        General Comments General comments (skin integrity, edema, etc.): Wife present and supportive    Exercises     Assessment/Plan    PT Assessment Patient needs continued PT services  PT Problem List Decreased strength;Decreased activity tolerance;Decreased balance;Decreased mobility;Decreased cognition       PT Treatment Interventions DME instruction;Gait training;Functional mobility training;Therapeutic activities;Therapeutic exercise;Balance training;Patient/family education    PT Goals (Current goals can be found in the Care Plan section)  Acute  Rehab PT Goals Patient Stated Goal: return home with continued [facility] services PT Goal Formulation: With patient/family Time For Goal Achievement: 07/11/21 Potential to Achieve Goals: Good    Frequency Min 3X/week     Co-evaluation   Reason for Co-Treatment: For patient/therapist safety;To address functional/ADL transfers   OT goals addressed during session: ADL's and self-care       AM-PAC PT "6 Clicks" Mobility  Outcome Measure Help needed turning from your back to your side while in a flat bed without using bedrails?: None Help needed moving from lying on your back to sitting on the side of a flat bed without using bedrails?: A Little Help needed moving to and from a bed to a chair (including a wheelchair)?: A Little Help needed standing up from a chair using your arms (e.g., wheelchair or bedside chair)?: A Little Help needed to walk in hospital room?: A Little Help needed climbing 3-5 steps with a railing? : A Little 6 Click Score: 19    End of Session Equipment Utilized During Treatment: Gait belt Activity Tolerance: Patient tolerated treatment well Patient left: in bed;with call bell/phone within reach;with family/visitor present Nurse Communication: Mobility status PT Visit Diagnosis: Other abnormalities of gait and mobility (R26.89);Muscle weakness (generalized) (M62.81)    Time: 9147-8295 PT Time Calculation (min) (ACUTE ONLY): 22 min   Charges:   PT Evaluation $PT Eval Moderate Complexity: 1 Mod         Mabeline Caras, PT, DPT Acute Rehabilitation Services  Pager [phone] Office [place] 06/27/2021, 12:53 PM

## 2021-06-27 NOTE — ED Notes (Signed)
Pt was incontinent of BM, I cleaned pt and applied a brief. Pt had a loose stool. I notified Dr Tyrell Antonio

## 2021-06-28 LAB — CLOSTRIDIUM DIFFICILE BY PCR, REFLEXED: Toxigenic C. Difficile by PCR: POSITIVE — AB

## 2021-06-28 LAB — CBC
HCT: 36.5 % — ABNORMAL LOW (ref 39.0–52.0)
Hemoglobin: 12.3 g/dL — ABNORMAL LOW (ref 13.0–17.0)
MCH: 30.8 pg (ref 26.0–34.0)
MCHC: 33.7 g/dL (ref 30.0–36.0)
MCV: 91.3 fL (ref 80.0–100.0)
Platelets: 177 10*3/uL (ref 150–400)
RBC: 4 MIL/uL — ABNORMAL LOW (ref 4.22–5.81)
RDW: 13.5 % (ref 11.5–15.5)
WBC: 4.8 10*3/uL (ref 4.0–10.5)
nRBC: 0 % (ref 0.0–0.2)

## 2021-06-28 LAB — BASIC METABOLIC PANEL
Anion gap: 4 — ABNORMAL LOW (ref 5–15)
BUN: 8 mg/dL (ref 8–23)
CO2: 30 mmol/L (ref 22–32)
Calcium: 9.1 mg/dL (ref 8.9–10.3)
Chloride: 104 mmol/L (ref 98–111)
Creatinine, Ser: 1.07 mg/dL (ref 0.61–1.24)
GFR, Estimated: >60 mL/min (ref >60)
Glucose, Bld: 144 mg/dL — ABNORMAL HIGH (ref 70–99)
Potassium: 3.2 mmol/L — ABNORMAL LOW (ref 3.5–5.1)
Sodium: 138 mmol/L (ref 135–145)

## 2021-06-28 LAB — C DIFFICILE QUICK SCREEN W PCR REFLEX
C Diff antigen: POSITIVE — AB
C Diff toxin: NEGATIVE

## 2021-06-28 MED ORDER — OXYBUTYNIN CHLORIDE 5 MG PO TABS
5.0000 mg | ORAL_TABLET | Freq: Three times a day (TID) | ORAL | Status: DC | PRN
  Administered 2021-06-28 – 2021-06-29 (×2): 5 mg via ORAL
  Filled 2021-06-28 (×2): qty 1

## 2021-06-28 MED ORDER — HYDROCODONE-ACETAMINOPHEN 5-325 MG PO TABS
1.0000 | ORAL_TABLET | Freq: Four times a day (QID) | ORAL | Status: DC | PRN
Start: 2021-06-28 — End: 2021-07-01
  Filled 2021-06-28: qty 1

## 2021-06-28 MED ORDER — POTASSIUM CHLORIDE CRYS ER 20 MEQ PO TBCR
40.0000 meq | EXTENDED_RELEASE_TABLET | ORAL | Status: AC
  Administered 2021-06-28 (×2): 40 meq via ORAL
  Filled 2021-06-28 (×2): qty 2

## 2021-06-28 MED ORDER — FIDAXOMICIN 200 MG PO TABS
200.0000 mg | ORAL_TABLET | Freq: Two times a day (BID) | ORAL | Status: DC
  Administered 2021-06-28 – 2021-07-01 (×7): 200 mg via ORAL
  Filled 2021-06-28 (×7): qty 1

## 2021-06-28 NOTE — Progress Notes (Signed)
Physical Therapy Treatment Patient Details Name: Dustin Hamilton MRN: [id number] DOB: [birth year] Today's Date: 06/28/2021   History of Present Illness 81 y.o. male admitted 06/26/21 with recurrent diarrhea, low-grade fever. 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 diff); acute metabolic encephalopathy secondary to AKI and dehydration. Abdominal CT suspicious for R renal mass malignancy. 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    PT Comments    Pt was seen for gait and standing ex's with good effort but continues to be unsteady.  He is expecting to go home and will need reliable help to do this, esp since his standing balance is not safe to walk alone.  Pt has more weakness in UE's than PT prev thought based on history, but also inattention of cognition creating challenge.  Follow up for goals of acute PT.  Continue to recommend HHPT to work on functional deficits.   Recommendations for follow up therapy are one component of a multi-disciplinary discharge planning process, led by the attending physician.  Recommendations may be updated based on patient status, additional functional criteria and insurance authorization.  Follow Up Recommendations  Home health PT     Assistance Recommended at Discharge Frequent or constant Supervision/Assistance  Patient can return home with the following A little help with walking and/or transfers;A little help with bathing/dressing/bathroom;Assistance with cooking/housework;Direct supervision/assist for medications management;Direct supervision/assist for financial management;Assist for transportation;Help with stairs or ramp for entrance   Equipment Recommendations  None recommended by PT    Recommendations for Other Services       Precautions / Restrictions Precautions Precautions: Fall Precaution Comments:  enteric Restrictions Weight Bearing Restrictions: No     Mobility  Bed Mobility Overal bed mobility: Needs Assistance Bed Mobility: Supine to Sit, Sit to Supine     Supine to sit: Min guard Sit to supine: Min guard        Transfers Overall transfer level: Needs assistance Equipment used: Rolling walker (2 wheels) Transfers: Sit to/from Stand Sit to Stand: Min guard                Ambulation/Gait Ambulation/Gait assistance: Min guard Gait Distance (Feet): 80 Feet Assistive device: Rolling walker (2 wheels) Gait Pattern/deviations: Step-through pattern, Decreased stride length, Trunk flexed Gait velocity: Decreased     General Gait Details: forward flexed posture observed to be caused by limited hip extension   Stairs             Wheelchair Mobility    Modified Rankin (Stroke Patients Only)       Balance Overall balance assessment: Needs assistance Sitting-balance support: Feet supported Sitting balance-Leahy Scale: Fair     Standing balance support: Bilateral upper extremity supported, During functional activity Standing balance-Leahy Scale: Poor                              Cognition Arousal/Alertness: Awake/alert Behavior During Therapy: WFL for tasks assessed/performed Overall Cognitive Status: History of cognitive impairments - at baseline                                 General Comments: no family present today        Exercises General Exercises - Lower Extremity Gluteal Sets: Strengthening, Standing, 10 reps Hip ABduction/ADduction: Strengthening, Standing, 10  reps Hip Flexion/Marching: Strengthening, Standing, 10 reps Toe Raises: Strengthening, Standing, 10 reps Mini-Sqauts: Strengthening, 10 reps    General Comments General comments (skin integrity, edema, etc.): pt is up to walk with weakness even in UE's and with poor control of LE's in turns and backing up to sit      Pertinent Vitals/Pain  Pain Assessment Pain Assessment: No/denies pain    Home Living                          Prior Function            PT Goals (current goals can now be found in the care plan section) Acute Rehab PT Goals Patient Stated Goal: return home with continued [facility] services Progress towards PT goals: Progressing toward goals    Frequency    Min 3X/week      PT Plan Current plan remains appropriate    Co-evaluation              AM-PAC PT "6 Clicks" Mobility   Outcome Measure  Help needed turning from your back to your side while in a flat bed without using bedrails?: None Help needed moving from lying on your back to sitting on the side of a flat bed without using bedrails?: A Little Help needed moving to and from a bed to a chair (including a wheelchair)?: A Little Help needed standing up from a chair using your arms (e.g., wheelchair or bedside chair)?: A Little Help needed to walk in hospital room?: A Little Help needed climbing 3-5 steps with a railing? : A Lot 6 Click Score: 18    End of Session Equipment Utilized During Treatment: Gait belt Activity Tolerance: Patient limited by fatigue;Treatment limited secondary to medical complications (Comment) Patient left: in bed;with call bell/phone within reach;with family/visitor present Nurse Communication: Mobility status PT Visit Diagnosis: Other abnormalities of gait and mobility (R26.89);Muscle weakness (generalized) (M62.81)     Time: 8295-6213 PT Time Calculation (min) (ACUTE ONLY): 32 min  Charges:  $Gait Training: 8-22 mins $Therapeutic Exercise: 8-22 mins            [name] 06/28/2021, 3:39 PM  [name], PT PhD Acute Rehab Dept. Number: [place] and [place]

## 2021-06-28 NOTE — Progress Notes (Signed)
RN spoke with Dr. Alinda Money and updated him on the patient problem with the foley and he stated to start by irrigating the foley and if still having problems to page Dr. Diona Fanti [phone]

## 2021-06-28 NOTE — TOC Initial Note (Signed)
Transition of Care [facility]) - Initial/Assessment Note    Patient Details  Name: Dustin Hamilton MRN: [id number] Date of Birth: 13-Jan-1941  Transition of Care [facility]) CM/SW Contact:    Ninfa Meeker, RN Phone Number: 06/28/2021, 11:46 AM  Clinical Narrative: Patient is an 81 yr old gentleman  admitted 06/26/21 with recurrent diarrhea, low-grade fever. 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 Case Manager spoke with patient's wife, [name] [phone], to discuss need for her husband's home health. She states that he is already active with [facility] for HHRN,PT/OT and speech. Patient has walker at home and family support. CM contacted Corene Cornea, Liaison with [facility] to confirm. TOC Team will continue to monitor.  Expected Discharge Plan: [place] Barriers to Discharge: Continued Medical Work up   Patient Goals and CMS Choice     Choice offered to / list presented to : Spouse  Expected Discharge Plan and Services Expected Discharge Plan: [place] In-house Referral: NA Discharge Planning Services: CM Consult Post Acute Care Choice: Home Health, Resumption of Svcs/PTA Provider Living arrangements for the past 2 months: Single Family Home                 DME Arranged: N/A DME Agency: NA       HH Arranged: RN, PT, OT, Nurse's Aide, Speech Therapy HH Agency: [place] ([facility]) Date HH Agency Contacted: 06/28/21 Time [place]: 51 Representative spoke with at [place]: Corene Cornea  Prior Living Arrangements/Services Living arrangements for the past 2 months: [place] with:: Spouse   Do you feel safe going back to the place where you live?: Yes      Need for Family Participation in Patient Care: Yes (Comment) Care giver support system in place?: Yes (comment) Current home services: Homehealth aide, Home OT, Home PT, Other (comment), Home RN (speech)    Activities of Daily  Living      Permission Sought/Granted                  Emotional Assessment         Alcohol / Substance Use: Not Applicable Psych Involvement: No (comment)  Admission diagnosis:  Lactic acidosis [E87.20] Urinary retention [R33.9] Somnolence [R40.0] AKI (acute kidney injury) ([place]) [N17.9] Sepsis ([place]) [A41.9] Syncope, unspecified syncope type [R55] Patient Active Problem List   Diagnosis Date Noted   Sepsis ([place]) 34/74/2595   Acute metabolic encephalopathy 63/87/5643   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   Fever and chills 04/01/2021   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   Complete heart block ([place])    Heart block    Syncope 05/03/2020   History of stroke 05/03/2020   History of supraventricular tachycardia 05/03/2020   Thrombocytopenia ([place]) 05/03/2020   AKI (acute kidney injury) ([place]) 05/03/2020   Memory impairment 05/03/2020   Atypical chest pain 01/27/2020   SVT (supraventricular tachycardia) ([place]) 06/03/2017   Dizziness 08/02/2014   CVA (cerebral infarction) 09/13/2012   Vertigo 09/12/2012   EAR PAIN 09/05/2009   BACK PAIN 06/20/2009   COLONIC POLYPS 05/06/2007   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   NONDEPENDENT ALCOHOL ABUSE IN REMISSION 05/06/2007   Essential hypertension 05/06/2007   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 [place] [address] Phone: [phone] Fax:  [phone]  [place], [place] - [place] [address] [place] [address] Phone: [phone] Fax: [phone]     Social Determinants of Health (SDOH) Interventions    Readmission Risk Interventions No flowsheet data found.

## 2021-06-28 NOTE — Progress Notes (Signed)
Urology paged awaiting call back to update about pt foley.

## 2021-06-28 NOTE — Progress Notes (Signed)
Pt still having 10/10 pain despite taking the medication for bladder spasms, Dr. Diona Fanti paged. Awaiting call back

## 2021-06-28 NOTE — Progress Notes (Signed)
Patient had 2 loose Bms this shift, Jeannette Corpus, NP notified. No new orders received at this time. Will continue to close monitor patient

## 2021-06-28 NOTE — Progress Notes (Addendum)
PROGRESS NOTE    Dustin Hamilton  LOV:[id number] DOB: [birth year] DOA: 06/26/2021 PCP: [facility]   Brief Narrative: 81 year old with past medical history significant for recent C. difficile colitis, BPH, advanced dementia, glaucoma, gout, PSVT status post ablation, PPM, chronic back pain who presents with recurrent diarrhea and low-grade fever.  Recently completed 10 days course of p.o. vancomycin 2 weeks prior to this admission.  Patient reported diarrhea has subsided.  However he started to have watery diarrhea again, not as foul smell as with C diff.  In the ED patient was found to be afebrile.  He did have a low-grade fever when EMS was called.  CT abdomen and pelvis with contrast showed no dissection.  Question of 1.7 cm right renal mass suspicious for malignancy, distended bladder, urology was consulted for difficult to place Foley catheter.  Patient had cloudy Foley catheter placed by urology.  He was also found to have lactic acidosis, lactic acid up to 4.5.  He received IV bolus.  Assessment & Plan:   Principal Problem:   Sepsis ([place]) Active Problems:   AKI (acute kidney injury) ([place])   1-SIRS;  Presents with tachycardia, leukocytosis, low grade fever.  Blood cultures: no growth to date.  Chest x ray no active cardiopulmonary diseases.  UA; 6-10 WBC. urine culture: No growth.  Hold on IV antibiotics.   AKI;  Obstructive uropathy possible BPH Cr peak to 1.7---cr down to 1.0 Improved with IV fluids.  Urology placed foley catheter.  Voiding trial when more active.   Diarrhea:  Prior history of C diff.  Continue with IV fluids.  He report stool are getting more watery, like when he had C diff.  Plan to check for C diff. Results pending.   Acute Metabolic encephalopathy:  CT head: no acute finding.  Resolved.   Near syncope.  Related to hypovolemia.  Continue with IV fluids.   History of SVT,  S/P PPM./ Ablation.  Continue Metoprolol.  Lactic  acidosis;  Related to hypovolemia, hypoperfusion. Resolved with IV fluids.   Right Renal Mass;  Needs to follow up with urology/VA.   Hypokalemia; replete orally.   Estimated body mass index is 22.32 kg/m as calculated from the following:   Height as of this encounter: 5\' 11"  (1.803 m).   Weight as of this encounter: 72.6 kg.   DVT prophylaxis: Lovenox Code Status: Full code Family Communication: Care discussed with patient and wife who was at bedside 1/26 Disposition Plan:  Status is: Inpatient  Remains inpatient appropriate because: required IV fluids, monitor for fever and diarrhea.         Consultants:  Urology   Procedures:  None  Antimicrobials:  None  Subjective: He report stool is getting more watery, like when he had C diff.  He denies abdominal pain.   Objective: Vitals:   06/27/21 1706 06/27/21 1900 06/28/21 0330 06/28/21 0749  BP: (!) 165/86 (!) 170/88 (!) 173/86 (!) 155/89  Pulse: 60 70 72 64  Resp: 18 18  18   Temp: 98.4 F (36.9 C) 98.6 F (37 C) 98.5 F (36.9 C) 98 F (36.7 C)  TempSrc: Oral Oral Oral Oral  SpO2: 96% 98% 98% 98%  Weight:      Height:        Intake/Output Summary (Last 24 hours) at 06/28/2021 0950 Last data filed at 06/28/2021 0552 Gross per 24 hour  Intake 2508.33 ml  Output 3100 ml  Net -591.67 ml    [employer]  06/26/21 0745  Weight: 72.6 kg    Examination:  General exam: NAD Respiratory system: CTA Cardiovascular system: S 1, S 2 RRR Gastrointestinal system: BS present, soft, nt Central nervous system: non focal.  Extremities: symmetric power    Data Reviewed: I have personally reviewed following labs and imaging studies  CBC: Recent Labs  Lab 06/26/21 0545 06/27/21 0500  WBC 12.5* 7.1  NEUTROABS 11.3*  --   HGB 15.3 13.0  HCT 47.6 38.4*  MCV 96.4 92.8  PLT 227 865    Basic Metabolic Panel: Recent Labs  Lab 06/26/21 0545 06/27/21 0500  NA 140 138  K 4.4 3.5  CL 102 106  CO2 24  26  GLUCOSE 140* 102*  BUN 18 13  CREATININE 1.76* 1.04  CALCIUM 10.1 9.2    GFR: Estimated Creatinine Clearance: 58.2 mL/min (by C-G formula based on SCr of 1.04 mg/dL). Liver Function Tests: Recent Labs  Lab 06/26/21 0545  AST 27  ALT 50*  ALKPHOS 61  BILITOT 0.9  PROT 7.9  ALBUMIN 3.3*    No results for input(s): LIPASE, AMYLASE in the last 168 hours. No results for input(s): AMMONIA in the last 168 hours. Coagulation Profile: No results for input(s): INR, PROTIME in the last 168 hours. Cardiac Enzymes: No results for input(s): CKTOTAL, CKMB, CKMBINDEX, TROPONINI in the last 168 hours. BNP (last 3 results) No results for input(s): PROBNP in the last 8760 hours. HbA1C: No results for input(s): HGBA1C in the last 72 hours. CBG: Recent Labs  Lab 06/26/21 0744  GLUCAP 132*    Lipid Profile: No results for input(s): CHOL, HDL, LDLCALC, TRIG, CHOLHDL, LDLDIRECT in the last 72 hours. Thyroid Function Tests: No results for input(s): TSH, T4TOTAL, FREET4, T3FREE, THYROIDAB in the last 72 hours. Anemia Panel: No results for input(s): VITAMINB12, FOLATE, FERRITIN, TIBC, IRON, RETICCTPCT in the last 72 hours. Sepsis Labs: Recent Labs  Lab 06/26/21 0545 06/26/21 0800 06/26/21 1308 06/27/21 0757  LATICACIDVEN 4.5* 2.8* 3.3* 1.4     Recent Results (from the past 240 hour(s))  Blood culture (routine x 2)     Status: None (Preliminary result)   Collection Time: 06/26/21  5:45 AM   Specimen: BLOOD RIGHT HAND  Result Value Ref Range Status   Specimen Description BLOOD RIGHT HAND  Final   Special Requests   Final    BOTTLES DRAWN AEROBIC AND ANAEROBIC Blood Culture adequate volume   Culture   Final    NO GROWTH 2 DAYS Performed at [facility], [place] [address] [place], [place] [address]    Report Status PENDING  Incomplete  Blood culture (routine x 2)     Status: None (Preliminary result)   Collection Time: 06/26/21  6:01 AM   Specimen: BLOOD  Result Value  Ref Range Status   Specimen Description BLOOD LEFT ANTECUBITAL  Final   Special Requests AEROBIC BOTTLE ONLY Blood Culture adequate volume  Final   Culture   Final    NO GROWTH 2 DAYS Performed at [facility], [place] [address], [place], [place] [address]    Report Status PENDING  Incomplete  Resp Panel by RT-PCR (Flu A&B, Covid) Nasopharyngeal Swab     Status: None   Collection Time: 06/26/21  6:24 AM   Specimen: Nasopharyngeal Swab; Nasopharyngeal(NP) swabs in vial transport medium  Result Value Ref Range Status   SARS Coronavirus 2 by RT PCR NEGATIVE NEGATIVE Final    Comment: (NOTE) SARS-CoV-2 target nucleic acids are  NOT DETECTED.  The SARS-CoV-2 RNA is generally detectable in upper respiratory specimens during the acute phase of infection. The lowest concentration of SARS-CoV-2 viral copies this assay can detect is 138 copies/mL. A negative result does not preclude SARS-Cov-2 infection and should not be used as the sole basis for treatment or other patient management decisions. A negative result may occur with  improper specimen collection/handling, submission of specimen other than nasopharyngeal swab, presence of viral mutation(s) within the areas targeted by this assay, and inadequate number of viral copies(<138 copies/mL). A negative result must be combined with clinical observations, patient history, and epidemiological information. The expected result is Negative.  Fact Sheet for Patients:  EntrepreneurPulse.com.au  Fact Sheet for Healthcare Providers:  IncredibleEmployment.be  This test is no t yet approved or cleared by the [place] FDA and  has been authorized for detection and/or diagnosis of SARS-CoV-2 by FDA under an Emergency Use Authorization (EUA). This EUA will remain  in effect (meaning this test can be used) for the duration of the COVID-19 declaration under Section 564(b)(1) of the Act, 21 U.S.C.section  360bbb-3(b)(1), unless the authorization is terminated  or revoked sooner.       Influenza A by PCR NEGATIVE NEGATIVE Final   Influenza B by PCR NEGATIVE NEGATIVE Final    Comment: (NOTE) The Xpert Xpress SARS-CoV-2/FLU/RSV plus assay is intended as an aid in the diagnosis of influenza from Nasopharyngeal swab specimens and should not be used as a sole basis for treatment. Nasal washings and aspirates are unacceptable for Xpert Xpress SARS-CoV-2/FLU/RSV testing.  Fact Sheet for Patients: EntrepreneurPulse.com.au  Fact Sheet for Healthcare Providers: IncredibleEmployment.be  This test is not yet approved or cleared by the [place] FDA and has been authorized for detection and/or diagnosis of SARS-CoV-2 by FDA under an Emergency Use Authorization (EUA). This EUA will remain in effect (meaning this test can be used) for the duration of the COVID-19 declaration under Section 564(b)(1) of the Act, 21 U.S.C. section 360bbb-3(b)(1), unless the authorization is terminated or revoked.  Performed at [facility], [place] [address], [place], [place] [address]   Urine Culture     Status: None   Collection Time: 06/26/21 12:59 PM   Specimen: Urine, Catheterized  Result Value Ref Range Status   Specimen Description URINE, CATHETERIZED  Final   Special Requests NONE  Final   Culture   Final    NO GROWTH Performed at [facility], [address], [place], [place] [address]    Report Status 06/27/2021 FINAL  Final          Radiology Studies: CT Cervical Spine Wo Contrast  Result Date: 06/26/2021 CLINICAL DATA:  Syncope, neck pain EXAM: CT CERVICAL SPINE WITHOUT CONTRAST TECHNIQUE: Multidetector CT imaging of the cervical spine was performed without intravenous contrast. Multiplanar CT image reconstructions were also generated. RADIATION DOSE REDUCTION: This exam was performed according to the departmental dose-optimization program  which includes automated exposure control, adjustment of the mA and/or kV according to patient size and/or use of iterative reconstruction technique. COMPARISON:  06/05/2021 FINDINGS: Alignment: Alignment of posterior margins of vertebral bodies is unremarkable. Skull base and vertebrae: No recent fracture is seen. Soft tissues and spinal canal: There is extrinsic pressure over the ventral margin of thecal sac caused by posterior bony spurs and spinal stenosis at C5-C6 and C6-C7 levels. Disc levels: Degenerative changes are noted with bony spurs from C2-C7 levels. There is encroachment of neural foramina by bony spurs and  facet hypertrophy from C2 to C7 levels, more so at C5-C6 and C6-C7 levels. Upper chest: Unremarkable. Other: Thyroid is enlarged. IMPRESSION: No recent fracture is seen. 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 Enlarged thyroid. Electronically Signed   By: Elmer Picker M.D.   On: 06/26/2021 12:51        Scheduled Meds:  acidophilus  2 capsule Oral TID   allopurinol  100 mg Oral Daily   atorvastatin  20 mg Oral Daily   brimonidine  1 drop Both Eyes BID   Chlorhexidine Gluconate Cloth  6 each Topical Daily   dutasteride  0.5 mg Oral Daily   enoxaparin (LOVENOX) injection  40 mg Subcutaneous Q24H   latanoprost  1 drop Both Eyes QHS   memantine  20 mg Oral QHS   metoprolol succinate  12.5 mg Oral QHS   tamsulosin  0.4 mg Oral QHS   Continuous Infusions:  ringers 100 mL/hr at 06/28/21 0552     LOS: 2 days    Time spent: 35 minutes.     Elmarie Shiley, MD [facility]   If 7PM-7AM, please contact night-coverage [email or web address]  06/28/2021, 9:50 AM

## 2021-06-28 NOTE — Progress Notes (Signed)
Pt called RN into the room and stated that he started having pain in his foley about an hour ago that just began. RN assessed the Foley and noticed there was blood at the insertion site and the urine in the bag was now blood tinged, it wad yellow earlier. Will notify MD.

## 2021-06-29 LAB — BASIC METABOLIC PANEL
Anion gap: 6 (ref 5–15)
BUN: 8 mg/dL (ref 8–23)
CO2: 28 mmol/L (ref 22–32)
Calcium: 9.2 mg/dL (ref 8.9–10.3)
Chloride: 105 mmol/L (ref 98–111)
Creatinine, Ser: 0.94 mg/dL (ref 0.61–1.24)
GFR, Estimated: >60 mL/min (ref >60)
Glucose, Bld: 91 mg/dL (ref 70–99)
Potassium: 3.9 mmol/L (ref 3.5–5.1)
Sodium: 139 mmol/L (ref 135–145)

## 2021-06-29 LAB — MAGNESIUM: Magnesium: 1.5 mg/dL — ABNORMAL LOW (ref 1.7–2.4)

## 2021-06-29 MED ORDER — MAGNESIUM SULFATE 2 GM/50ML IV SOLN
2.0000 g | Freq: Once | INTRAVENOUS | Status: AC
  Administered 2021-06-29: 2 g via INTRAVENOUS
  Filled 2021-06-29: qty 50

## 2021-06-29 NOTE — Progress Notes (Signed)
PROGRESS NOTE    Dustin Hamilton  AQT:[id number] DOB: [birth year] DOA: 06/26/2021 PCP: [facility]   Brief Narrative: 81 year old with past medical history significant for recent C. difficile colitis, BPH, advanced dementia, glaucoma, gout, PSVT status post ablation, PPM, chronic back pain who presents with recurrent diarrhea and low-grade fever.  Recently completed 10 days course of p.o. vancomycin 2 weeks prior to this admission.  Patient reported diarrhea has subsided.  However he started to have watery diarrhea again, not as foul smell as with C diff.  In the ED patient was found to be afebrile.  He did have a low-grade fever when EMS was called.  CT abdomen and pelvis with contrast showed no dissection.  Question of 1.7 cm right renal mass suspicious for malignancy, distended bladder, urology was consulted for difficult to place Foley catheter.  Patient had cloudy Foley catheter placed by urology.  He was also found to have lactic acidosis, lactic acid up to 4.5.  He received IV bolus.  Assessment & Plan:   Principal Problem:   Sepsis ([place]) Active Problems:   AKI (acute kidney injury) ([place])   1-SIRS; in setting of C diff.  Sepsis POA>  Presents with tachycardia, leukocytosis, low grade fever.  Blood cultures: no growth to date.  Chest x ray no active cardiopulmonary diseases.  UA; 6-10 WBC. urine culture: No growth.  Hold on IV antibiotics.  Started on Dificid.   AKI;  Obstructive uropathy possible BPH Cr peak to 1.7---cr down to 1.0 Improved with IV fluids.  Urology placed foley catheter.  Voiding trial when more active.   Diarrhea:  Prior history of C diff.  Continue with IV fluids.  C diff: Toxine positive.  Started Dificid 1/27. Continue with fluid, monitor for responds.    Acute Metabolic encephalopathy:  CT head: no acute finding.  Resolved.   Near syncope.  Related to hypovolemia.  Continue with IV fluids.   History of SVT,  S/P PPM./  Ablation.  Continue Metoprolol.  Lactic acidosis;  Related to hypovolemia, hypoperfusion. Resolved with IV fluids.   Right Renal Mass;  Needs to follow up with urology/VA.   Hypomagnesemia; Replete IV Hypokalemia; replete orally.   Estimated body mass index is 22.32 kg/m as calculated from the following:   Height as of this encounter: 5\' 11"  (1.803 m).   Weight as of this encounter: 72.6 kg.   DVT prophylaxis: Lovenox Code Status: Full code Family Communication: Care discussed with patient and wife over phone 1/28 Disposition Plan:  Status is: Inpatient  Remains inpatient appropriate because: required IV fluids, monitor for fever and diarrhea.         Consultants:  Urology   Procedures:  None  Antimicrobials:  None  Subjective: He had two BM over night, denies abdominal pain. Report mild pain foley catheter. Urine clear, no hematuria.   Objective: Vitals:   06/28/21 1620 06/28/21 2101 06/29/21 0505 06/29/21 0739  BP: (!) 165/97 (!) 167/90 (!) 151/103 (!) 169/97  Pulse: 81 64 72 72  Resp: 18 18 17 18   Temp: 97.7 F (36.5 C) (!) 97.5 F (36.4 C) 97.8 F (36.6 C) 97.8 F (36.6 C)  TempSrc: Oral Oral Oral Oral  SpO2: 97% 100% 98% 97%  Weight:      Height:        Intake/Output Summary (Last 24 hours) at 06/29/2021 0905 Last data filed at 06/29/2021 0802 Gross per 24 hour  Intake 1961.67 ml  Output 4400 ml  Net -2438.33  ml    Filed Weights   06/26/21 0745  Weight: 72.6 kg    Examination:  General exam: NAD Respiratory system: CTA Cardiovascular system: S 1, S 2 RRR Gastrointestinal system: BS present, soft, nt Central nervous system: Alert, follows command Extremities: no edema, symmetric power    Data Reviewed: I have personally reviewed following labs and imaging studies  CBC: Recent Labs  Lab 06/26/21 0545 06/27/21 0500 06/28/21 1016  WBC 12.5* 7.1 4.8  NEUTROABS 11.3*  --   --   HGB 15.3 13.0 12.3*  HCT 47.6 38.4* 36.5*  MCV  96.4 92.8 91.3  PLT 227 187 875    Basic Metabolic Panel: Recent Labs  Lab 06/26/21 0545 06/27/21 0500 06/28/21 1016 06/29/21 0100  NA 140 138 138 139  K 4.4 3.5 3.2* 3.9  CL 102 106 104 105  CO2 24 26 30 28   GLUCOSE 140* 102* 144* 91  BUN 18 13 8 8   CREATININE 1.76* 1.04 1.07 0.94  CALCIUM 10.1 9.2 9.1 9.2  MG  --   --   --  1.5*    GFR: Estimated Creatinine Clearance: 64.4 mL/min (by C-G formula based on SCr of 0.94 mg/dL). Liver Function Tests: Recent Labs  Lab 06/26/21 0545  AST 27  ALT 50*  ALKPHOS 61  BILITOT 0.9  PROT 7.9  ALBUMIN 3.3*    No results for input(s): LIPASE, AMYLASE in the last 168 hours. No results for input(s): AMMONIA in the last 168 hours. Coagulation Profile: No results for input(s): INR, PROTIME in the last 168 hours. Cardiac Enzymes: No results for input(s): CKTOTAL, CKMB, CKMBINDEX, TROPONINI in the last 168 hours. BNP (last 3 results) No results for input(s): PROBNP in the last 8760 hours. HbA1C: No results for input(s): HGBA1C in the last 72 hours. CBG: Recent Labs  Lab 06/26/21 0744  GLUCAP 132*    Lipid Profile: No results for input(s): CHOL, HDL, LDLCALC, TRIG, CHOLHDL, LDLDIRECT in the last 72 hours. Thyroid Function Tests: No results for input(s): TSH, T4TOTAL, FREET4, T3FREE, THYROIDAB in the last 72 hours. Anemia Panel: No results for input(s): VITAMINB12, FOLATE, FERRITIN, TIBC, IRON, RETICCTPCT in the last 72 hours. Sepsis Labs: Recent Labs  Lab 06/26/21 0545 06/26/21 0800 06/26/21 1308 06/27/21 0757  LATICACIDVEN 4.5* 2.8* 3.3* 1.4     Recent Results (from the past 240 hour(s))  Blood culture (routine x 2)     Status: None (Preliminary result)   Collection Time: 06/26/21  5:45 AM   Specimen: BLOOD RIGHT HAND  Result Value Ref Range Status   Specimen Description BLOOD RIGHT HAND  Final   Special Requests   Final    BOTTLES DRAWN AEROBIC AND ANAEROBIC Blood Culture adequate volume   Culture   Final     NO GROWTH 3 DAYS Performed at [facility], [place] [address], [place], [place] [address]    Report Status PENDING  Incomplete  Blood culture (routine x 2)     Status: None (Preliminary result)   Collection Time: 06/26/21  6:01 AM   Specimen: BLOOD  Result Value Ref Range Status   Specimen Description BLOOD LEFT ANTECUBITAL  Final   Special Requests AEROBIC BOTTLE ONLY Blood Culture adequate volume  Final   Culture   Final    NO GROWTH 3 DAYS Performed at [facility], [place] [address], [place], [place] [address]    Report Status PENDING  Incomplete  Resp Panel by RT-PCR (Flu A&B, Covid) Nasopharyngeal Swab  Status: None   Collection Time: 06/26/21  6:24 AM   Specimen: Nasopharyngeal Swab; Nasopharyngeal(NP) swabs in vial transport medium  Result Value Ref Range Status   SARS Coronavirus 2 by RT PCR NEGATIVE NEGATIVE Final    Comment: (NOTE) SARS-CoV-2 target nucleic acids are NOT DETECTED.  The SARS-CoV-2 RNA is generally detectable in upper respiratory specimens during the acute phase of infection. The lowest concentration of SARS-CoV-2 viral copies this assay can detect is 138 copies/mL. A negative result does not preclude SARS-Cov-2 infection and should not be used as the sole basis for treatment or other patient management decisions. A negative result may occur with  improper specimen collection/handling, submission of specimen other than nasopharyngeal swab, presence of viral mutation(s) within the areas targeted by this assay, and inadequate number of viral copies(<138 copies/mL). A negative result must be combined with clinical observations, patient history, and epidemiological information. The expected result is Negative.  Fact Sheet for Patients:  EntrepreneurPulse.com.au  Fact Sheet for Healthcare Providers:  IncredibleEmployment.be  This test is no t yet approved or cleared by the [place] FDA and  has been  authorized for detection and/or diagnosis of SARS-CoV-2 by FDA under an Emergency Use Authorization (EUA). This EUA will remain  in effect (meaning this test can be used) for the duration of the COVID-19 declaration under Section 564(b)(1) of the Act, 21 U.S.C.section 360bbb-3(b)(1), unless the authorization is terminated  or revoked sooner.       Influenza A by PCR NEGATIVE NEGATIVE Final   Influenza B by PCR NEGATIVE NEGATIVE Final    Comment: (NOTE) The Xpert Xpress SARS-CoV-2/FLU/RSV plus assay is intended as an aid in the diagnosis of influenza from Nasopharyngeal swab specimens and should not be used as a sole basis for treatment. Nasal washings and aspirates are unacceptable for Xpert Xpress SARS-CoV-2/FLU/RSV testing.  Fact Sheet for Patients: EntrepreneurPulse.com.au  Fact Sheet for Healthcare Providers: IncredibleEmployment.be  This test is not yet approved or cleared by the [place] FDA and has been authorized for detection and/or diagnosis of SARS-CoV-2 by FDA under an Emergency Use Authorization (EUA). This EUA will remain in effect (meaning this test can be used) for the duration of the COVID-19 declaration under Section 564(b)(1) of the Act, 21 U.S.C. section 360bbb-3(b)(1), unless the authorization is terminated or revoked.  Performed at [facility], [place] [address] [place], [place] [address]   Urine Culture     Status: None   Collection Time: 06/26/21 12:59 PM   Specimen: Urine, Catheterized  Result Value Ref Range Status   Specimen Description URINE, CATHETERIZED  Final   Special Requests NONE  Final   Culture   Final    NO GROWTH Performed at [facility], [address] [place], [place] [address]    Report Status 06/27/2021 FINAL  Final  C Difficile Quick Screen w PCR reflex     Status: Abnormal   Collection Time: 06/28/21  9:57 AM   Specimen: STOOL  Result Value Ref Range Status   C Diff  antigen POSITIVE (A) NEGATIVE Final   C Diff toxin NEGATIVE NEGATIVE Final   C Diff interpretation Results are indeterminate. See PCR results.  Final    Comment: Performed at [facility], [place] [address] [place], [place] [address]  C. Diff by PCR, Reflexed     Status: Abnormal   Collection Time: 06/28/21  9:57 AM  Result Value Ref Range Status   Toxigenic C. Difficile by PCR  POSITIVE (A) NEGATIVE Final    Comment: Positive for toxigenic C. difficile with little to no toxin production. Only treat if clinical presentation suggests symptomatic illness. Performed at [facility], [place] [address], [place], [place] [address]           Radiology Studies: No results found.      Scheduled Meds:  acidophilus  2 capsule Oral TID   allopurinol  100 mg Oral Daily   atorvastatin  20 mg Oral Daily   brimonidine  1 drop Both Eyes BID   Chlorhexidine Gluconate Cloth  6 each Topical Daily   dutasteride  0.5 mg Oral Daily   enoxaparin (LOVENOX) injection  40 mg Subcutaneous Q24H   fidaxomicin  200 mg Oral BID   latanoprost  1 drop Both Eyes QHS   memantine  20 mg Oral QHS   metoprolol succinate  12.5 mg Oral QHS   tamsulosin  0.4 mg Oral QHS   Continuous Infusions:  magnesium sulfate bolus IVPB     ringers 100 mL/hr at 06/29/21 0637     LOS: 3 days    Time spent: 35 minutes.     Elmarie Shiley, MD [facility]   If 7PM-7AM, please contact night-coverage [email or web address]  06/29/2021, 9:05 AM

## 2021-06-30 ENCOUNTER — Encounter (HOSPITAL_COMMUNITY): Payer: Self-pay | Admitting: Internal Medicine

## 2021-06-30 LAB — BASIC METABOLIC PANEL
Anion gap: 7 (ref 5–15)
BUN: 7 mg/dL — ABNORMAL LOW (ref 8–23)
CO2: 28 mmol/L (ref 22–32)
Calcium: 9.4 mg/dL (ref 8.9–10.3)
Chloride: 102 mmol/L (ref 98–111)
Creatinine, Ser: 0.88 mg/dL (ref 0.61–1.24)
GFR, Estimated: >60 mL/min (ref >60)
Glucose, Bld: 94 mg/dL (ref 70–99)
Potassium: 4 mmol/L (ref 3.5–5.1)
Sodium: 137 mmol/L (ref 135–145)

## 2021-06-30 LAB — CBC
HCT: 40.2 % (ref 39.0–52.0)
Hemoglobin: 13.7 g/dL (ref 13.0–17.0)
MCH: 30.8 pg (ref 26.0–34.0)
MCHC: 34.1 g/dL (ref 30.0–36.0)
MCV: 90.3 fL (ref 80.0–100.0)
Platelets: 184 10*3/uL (ref 150–400)
RBC: 4.45 MIL/uL (ref 4.22–5.81)
RDW: 13.3 % (ref 11.5–15.5)
WBC: 6.2 10*3/uL (ref 4.0–10.5)
nRBC: 0 % (ref 0.0–0.2)

## 2021-06-30 LAB — MAGNESIUM: Magnesium: 1.9 mg/dL (ref 1.7–2.4)

## 2021-06-30 NOTE — Treatment Plan (Signed)
I was reached out to by Dr. Tyrell Antonio regarding this patient who had a Foley catheter placed by Dr. Diona Fanti on 06/26/2021 for urinary retention presenting with C. difficile patient was in urinary retention.  Patient is now ambulating and is nearing discharge.  Plan for discharge tomorrow.  Decided to remove Foley catheter tomorrow at 6 AM and perform trial of void prior to discharge.  Patient should follow-up with his local urologist.  Bishop Limbo, MD [facility]

## 2021-06-30 NOTE — Progress Notes (Signed)
PROGRESS NOTE    Dustin Hamilton  BUL:[id number] DOB: 08-26-1940 DOA: 06/26/2021 [facility]CP: [facility]   Brief Narrative: 81 year old with past medical history significant for recent C. difficile colitis, BPH, advanced dementia, glaucoma, gout, PSVT status post ablation, PPM, chronic back pain who presents with recurrent diarrhea and low-grade fever.  Recently completed 10 days course of p.o. vancomycin 2 weeks prior to this admission.  Patient reported diarrhea has subsided.  However he started to have watery diarrhea again, not as foul smell as with C diff.  In the ED patient was found to be afebrile.  He did have a low-grade fever when EMS was called.  CT abdomen and pelvis with contrast showed no dissection.  Question of 1.7 cm right renal mass suspicious for malignancy, distended bladder, urology was consulted for difficult to place Foley catheter.  Patient had cloudy Foley catheter placed by urology.  He was also found to have lactic acidosis, lactic acid up to 4.5.  He received IV bolus.  Assessment & Plan:   Principal Problem:   Sepsis ([place]) Active Problems:   AKI (acute kidney injury) ([place])   1-SIRS; in setting of C diff.  Sepsis POA>  Presents with tachycardia, leukocytosis, low grade fever.  Blood cultures: no growth to date.  Chest x ray no active cardiopulmonary diseases.  UA; 6-10 WBC. urine culture: No growth.  Hold on IV antibiotics.  Started on Dificid.   AKI;  Obstructive uropathy possible BPH Cr peak to 1.7---cr down to 1.0 Improved with IV fluids.  Urology placed foley catheter.  Voiding trial when more active.   Diarrhea:  Prior history of C diff.  Treated  with IV fluids.  C diff: Toxine positive.  Started Dificid 1/27. He is feeling better, diarrhea improving.   Acute Metabolic encephalopathy:  CT head: no acute finding.  Resolved.   Near syncope.  Related to hypovolemia.  Continue with IV fluids.   History of SVT,  S/P PPM./  Ablation.  Continue Metoprolol.  Lactic acidosis;  Related to hypovolemia, hypoperfusion. Resolved with IV fluids.   Right Renal Mass;  Needs to follow up with urology/VA.   Hypomagnesemia; Replaced.  Hypokalemia; Resolved.   Estimated body mass index is 22.32 kg/m as calculated from the following:   Height as of this encounter: 5\' 11"  (1.803 m).   Weight as of this encounter: 72.6 kg.   DVT prophylaxis: Lovenox Code Status: Full code Family Communication: Care discussed with patient and wife over phone 1/28 Disposition Plan:  Status is: Inpatient  Remains inpatient appropriate because: home tomorrow, plan for voiding trial tomorrow.         Consultants:  Urology   Procedures:  None  Antimicrobials:  None  Subjective: He is feeling better. Diarrhea improved.   Objective: Vitals:   06/29/21 1947 06/29/21 2128 06/30/21 0500 06/30/21 0749  BP: (!) 184/98 (!) 137/93 (!) 161/92 (!) 162/96  Pulse: 68 65 68 70  Resp:   19 18  Temp: 97.9 F (36.6 C)  97.7 F (36.5 C) 97.9 F (36.6 C)  TempSrc: Oral  Oral Oral  SpO2: 98%  97% 96%  Weight:      Height:        Intake/Output Summary (Last 24 hours) at 06/30/2021 1208 Last data filed at 06/30/2021 1100 Gross per 24 hour  Intake 1500 ml  Output 3450 ml  Net -1950 ml    Filed Weights   06/26/21 0745  Weight: 72.6 kg    Examination:  General exam: NAD Respiratory system: CTA Cardiovascular system: S 1,  S 2 RRR Gastrointestinal system: BS present, soft, nt Central nervous system: Alert, follows command Extremities: No edema    Data Reviewed: I have personally reviewed following labs and imaging studies  CBC: Recent Labs  Lab 06/26/21 0545 06/27/21 0500 06/28/21 1016 06/30/21 0153  WBC 12.5* 7.1 4.8 6.2  NEUTROABS 11.3*  --   --   --   HGB 15.3 13.0 12.3* 13.7  HCT 47.6 38.4* 36.5* 40.2  MCV 96.4 92.8 91.3 90.3  PLT 227 187 177 300    Basic Metabolic Panel: Recent Labs  Lab  06/26/21 0545 06/27/21 0500 06/28/21 1016 06/29/21 0100 06/30/21 0153  NA 140 138 138 139 137  K 4.4 3.5 3.2* 3.9 4.0  CL 102 106 104 105 102  CO2 24 26 30 28 28   GLUCOSE 140* 102* 144* 91 94  BUN 18 13 8 8  7*  CREATININE 1.76* 1.04 1.07 0.94 0.88  CALCIUM 10.1 9.2 9.1 9.2 9.4  MG  --   --   --  1.5* 1.9    GFR: Estimated Creatinine Clearance: 68.8 mL/min (by C-G formula based on SCr of 0.88 mg/dL). Liver Function Tests: Recent Labs  Lab 06/26/21 0545  AST 27  ALT 50*  ALKPHOS 61  BILITOT 0.9  PROT 7.9  ALBUMIN 3.3*    No results for input(s): LIPASE, AMYLASE in the last 168 hours. No results for input(s): AMMONIA in the last 168 hours. Coagulation Profile: No results for input(s): INR, PROTIME in the last 168 hours. Cardiac Enzymes: No results for input(s): CKTOTAL, CKMB, CKMBINDEX, TROPONINI in the last 168 hours. BNP (last 3 results) No results for input(s): PROBNP in the last 8760 hours. HbA1C: No results for input(s): HGBA1C in the last 72 hours. CBG: Recent Labs  Lab 06/26/21 0744  GLUCAP 132*    Lipid Profile: No results for input(s): CHOL, HDL, LDLCALC, TRIG, CHOLHDL, LDLDIRECT in the last 72 hours. Thyroid Function Tests: No results for input(s): TSH, T4TOTAL, FREET4, T3FREE, THYROIDAB in the last 72 hours. Anemia Panel: No results for input(s): VITAMINB12, FOLATE, FERRITIN, TIBC, IRON, RETICCTPCT in the last 72 hours. Sepsis Labs: Recent Labs  Lab 06/26/21 0545 06/26/21 0800 06/26/21 1308 06/27/21 0757  LATICACIDVEN 4.5* 2.8* 3.3* 1.4     Recent Results (from the past 240 hour(s))  Blood culture (routine x 2)     Status: None (Preliminary result)   Collection Time: 06/26/21  5:45 AM   Specimen: BLOOD RIGHT HAND  Result Value Ref Range Status   Specimen Description BLOOD RIGHT HAND  Final   Special Requests   Final    BOTTLES DRAWN AEROBIC AND ANAEROBIC Blood Culture adequate volume   Culture   Final    NO GROWTH 4 DAYS Performed at  [facility], [place] [address], [place], [place] [address]    Report Status PENDING  Incomplete  Blood culture (routine x 2)     Status: None (Preliminary result)   Collection Time: 06/26/21  6:01 AM   Specimen: BLOOD  Result Value Ref Range Status   Specimen Description BLOOD LEFT ANTECUBITAL  Final   Special Requests AEROBIC BOTTLE ONLY Blood Culture adequate volume  Final   Culture   Final    NO GROWTH 4 DAYS Performed at [facility], [place] [address] [place], [place] [address]    Report Status PENDING  Incomplete  Resp Panel by RT-PCR (Flu A&B, Covid) Nasopharyngeal  Swab     Status: None   Collection Time: 06/26/21  6:24 AM   Specimen: Nasopharyngeal Swab; Nasopharyngeal(NP) swabs in vial transport medium  Result Value Ref Range Status   SARS Coronavirus 2 by RT PCR NEGATIVE NEGATIVE Final    Comment: (NOTE) SARS-CoV-2 target nucleic acids are NOT DETECTED.  The SARS-CoV-2 RNA is generally detectable in upper respiratory specimens during the acute phase of infection. The lowest concentration of SARS-CoV-2 viral copies this assay can detect is 138 copies/mL. A negative result does not preclude SARS-Cov-2 infection and should not be used as the sole basis for treatment or other patient management decisions. A negative result may occur with  improper specimen collection/handling, submission of specimen other than nasopharyngeal swab, presence of viral mutation(s) within the areas targeted by this assay, and inadequate number of viral copies(<138 copies/mL). A negative result must be combined with clinical observations, patient history, and epidemiological information. The expected result is Negative.  Fact Sheet for Patients:  EntrepreneurPulse.com.au  Fact Sheet for Healthcare Providers:  IncredibleEmployment.be  This test is no t yet approved or cleared by the [place] FDA and  has been authorized for detection and/or  diagnosis of SARS-CoV-2 by FDA under an Emergency Use Authorization (EUA). This EUA will remain  in effect (meaning this test can be used) for the duration of the COVID-19 declaration under Section 564(b)(1) of the Act, 21 U.S.C.section 360bbb-3(b)(1), unless the authorization is terminated  or revoked sooner.       Influenza A by PCR NEGATIVE NEGATIVE Final   Influenza B by PCR NEGATIVE NEGATIVE Final    Comment: (NOTE) The Xpert Xpress SARS-CoV-2/FLU/RSV plus assay is intended as an aid in the diagnosis of influenza from Nasopharyngeal swab specimens and should not be used as a sole basis for treatment. Nasal washings and aspirates are unacceptable for Xpert Xpress SARS-CoV-2/FLU/RSV testing.  Fact Sheet for Patients: EntrepreneurPulse.com.au  Fact Sheet for Healthcare Providers: IncredibleEmployment.be  This test is not yet approved or cleared by the [place] FDA and has been authorized for detection and/or diagnosis of SARS-CoV-2 by FDA under an Emergency Use Authorization (EUA). This EUA will remain in effect (meaning this test can be used) for the duration of the COVID-19 declaration under Section 564(b)(1) of the Act, 21 U.S.C. section 360bbb-3(b)(1), unless the authorization is terminated or revoked.  Performed at [facility], [place] [address] [place], [place] [address]   Urine Culture     Status: None   Collection Time: 06/26/21 12:59 PM   Specimen: Urine, Catheterized  Result Value Ref Range Status   Specimen Description URINE, CATHETERIZED  Final   Special Requests NONE  Final   Culture   Final    NO GROWTH Performed at [facility], [address], [place], [place] [address]    Report Status 06/27/2021 FINAL  Final  C Difficile Quick Screen w PCR reflex     Status: Abnormal   Collection Time: 06/28/21  9:57 AM   Specimen: STOOL  Result Value Ref Range Status   C Diff antigen POSITIVE (A) NEGATIVE Final    C Diff toxin NEGATIVE NEGATIVE Final   C Diff interpretation Results are indeterminate. See PCR results.  Final    Comment: Performed at [facility], [place] [address] [place], [place] [address]  C. Diff by PCR, Reflexed     Status: Abnormal   Collection Time: 06/28/21  9:57 AM  Result Value Ref Range Status  Toxigenic C. Difficile by PCR POSITIVE (A) NEGATIVE Final    Comment: Positive for toxigenic C. difficile with little to no toxin production. Only treat if clinical presentation suggests symptomatic illness. Performed at [facility], [place] [address] [place], [place] [address]           Radiology Studies: No results found.      Scheduled Meds:  acidophilus  2 capsule Oral TID   allopurinol  100 mg Oral Daily   atorvastatin  20 mg Oral Daily   brimonidine  1 drop Both Eyes BID   Chlorhexidine Gluconate Cloth  6 each Topical Daily   dutasteride  0.5 mg Oral Daily   enoxaparin (LOVENOX) injection  40 mg Subcutaneous Q24H   fidaxomicin  200 mg Oral BID   latanoprost  1 drop Both Eyes QHS   memantine  20 mg Oral QHS   metoprolol succinate  12.5 mg Oral QHS   tamsulosin  0.4 mg Oral QHS   Continuous Infusions:     LOS: 4 days    Time spent: 35 minutes.     Elmarie Shiley, MD [facility]   If 7PM-7AM, please contact night-coverage [email or web address]  06/30/2021, 12:08 PM

## 2021-07-01 ENCOUNTER — Other Ambulatory Visit (HOSPITAL_COMMUNITY): Payer: Self-pay

## 2021-07-01 LAB — CULTURE, BLOOD (ROUTINE X 2)
Culture: NO GROWTH
Culture: NO GROWTH
Special Requests: ADEQUATE
Special Requests: ADEQUATE

## 2021-07-01 MED ORDER — FIDAXOMICIN 200 MG PO TABS: 200.0000 mg | ORAL_TABLET | Freq: Two times a day (BID) | ORAL | 0 refills | Status: DC

## 2021-07-01 MED ORDER — FIDAXOMICIN 200 MG PO TABS: 200.0000 mg | ORAL_TABLET | Freq: Two times a day (BID) | ORAL | 0 refills | Status: AC

## 2021-07-01 MED ORDER — FIDAXOMICIN 200 MG PO TABS
200.0000 mg | ORAL_TABLET | Freq: Two times a day (BID) | ORAL | 0 refills | Status: DC
  Filled 2021-07-01: qty 14, 7d supply, fill #0

## 2021-07-01 NOTE — Plan of Care (Signed)

## 2021-07-01 NOTE — Discharge Planning (Addendum)
Physician Discharge Summary  Dustin Hamilton:[id number] DOB: July 02, 1940 DOA: 06/26/2021  PCP: [facility]  Admit date: 06/26/2021 Discharge date: 07/01/2021  Admitted From: Home  Disposition:  Home   Recommendations for Outpatient Follow-up:  Follow up with PCP in 1-2 weeks Please obtain BMP/CBC in one week Follow up resolution of C diff.  Needs to follow up with urologist for renal mass and BPH  Home Health: Yes, PT  Discharge Condition: Stable.  CODE STATUS: Full code Diet recommendation: Heart Healthy    Brief/Interim Summary: 81 year old with past medical history significant for recent C. difficile colitis, BPH, advanced dementia, glaucoma, gout, PSVT status post ablation, PPM, chronic back pain who presents with recurrent diarrhea and low-grade fever.   Recently completed 10 days course of p.o. vancomycin 2 weeks prior to this admission.  Patient reported diarrhea has subsided.  However he started to have watery diarrhea again, not as foul smell as with C diff.  In the ED patient was found to be afebrile.  He did have a low-grade fever when EMS was called.  CT abdomen and pelvis with contrast showed no dissection.  Question of 1.7 cm right renal mass suspicious for malignancy, distended bladder, urology was consulted for difficult to place Foley catheter.  Patient had cloudy Foley catheter placed by urology.   He was also found to have lactic acidosis, lactic acid up to 4.5.  He received IV bolus.     1-SIRS; in setting of C diff.  Sepsis POA>  Presents with tachycardia, leukocytosis, low grade fever.  Blood cultures: no growth to date.  Chest x ray no active cardiopulmonary diseases.  UA; 6-10 WBC. urine culture: No growth.  Started on Dificid. Needs 7 more days of Tx.    AKI;  Obstructive uropathy possible BPH Cr peak to 1.7---cr down to 1.0 Improved with IV fluids.  Urology placed foley catheter.  Voiding trial today, if fail will need to be  discharge with foley.    Diarrhea:  Prior history of C diff.  Treated  with IV fluids.  C diff: Toxine positive.  Started Dificid 1/27. Needs 7 more days of Dificid.  Resolving.    Acute Metabolic encephalopathy:  CT head: no acute finding.  Resolved.    Near syncope.  Related to hypovolemia.  Continue with IV fluids.    History of SVT,  S/P PPM./ Ablation.  Continue Metoprolol.   Lactic acidosis;  Related to hypovolemia, hypoperfusion. Resolved with IV fluids.    Right Renal Mass;  Needs to follow up with urology/[facility].    Hypomagnesemia; Replaced.  Hypokalemia; Resolved.    Discharge Diagnoses:  Principal Problem:   Sepsis ([place]) Active Problems:   AKI (acute kidney injury) [facility])    Discharge Instructions  Discharge Instructions     Diet - low sodium heart healthy   Complete by: As directed    Increase activity slowly   Complete by: As directed       Allergies as of 07/01/2021       Reactions   Aricept [donepezil]    dizziness   Levetiracetam Other (See Comments)   AMS   Penicillins Hives   Viagra [sildenafil]    dizziness        Medication List     TAKE these medications    acetaminophen 500 MG tablet Commonly known as: TYLENOL Take 1,000 mg by mouth every 6 (six) hours as needed for fever or mild pain.   allopurinol 100 MG tablet Commonly  known as: Zyloprim Take 1 tablet (100 mg total) by mouth daily.   atorvastatin 40 MG tablet Commonly known as: LIPITOR Take 0.5 tablets (20 mg total) by mouth daily.   brimonidine 0.2 % ophthalmic solution Commonly known as: ALPHAGAN Place 1 drop into both eyes 2 (two) times daily.   CLEAR EYES OP Apply 2 drops to eye daily as needed (dry eyes).   diclofenac Sodium 1 % Gel Commonly known as: VOLTAREN Apply 4 g topically 4 (four) times daily. What changed:  when to take this reasons to take this   dutasteride 0.5 MG capsule Commonly known as: AVODART Take 0.5 mg by mouth daily.   ENSURE  ACTIVE HEART HEALTH PO Take 1 Bottle by mouth daily as needed (appetite, nutrition).   fidaxomicin 200 MG Tabs tablet Commonly known as: DIFICID Take 1 tablet (200 mg total) by mouth 2 (two) times daily for 7 days.   latanoprost 0.005 % ophthalmic solution Commonly known as: XALATAN Place 1 drop into both eyes at bedtime.   lidocaine 5 % Commonly known as: LIDODERM Place 1 patch onto the skin daily as needed (back pain). Remove & Discard patch within 12 hours or as directed by MD   memantine 10 MG tablet Commonly known as: NAMENDA Take 20 mg by mouth at bedtime.   methocarbamol 500 MG tablet Commonly known as: ROBAXIN Take 1 tablet (500 mg total) by mouth 2 (two) times daily. What changed:  when to take this reasons to take this   metoprolol succinate 25 MG 24 hr tablet Commonly known as: TOPROL-XL Take 0.5 tablets (12.5 mg total) by mouth at bedtime.   tamsulosin 0.4 MG Caps capsule Commonly known as: FLOMAX Take 0.4 mg by mouth at bedtime.   tiZANidine 4 MG tablet Commonly known as: ZANAFLEX Take 4 mg by mouth 2 (two) times daily as needed for muscle spasms.        Follow-up [place] Follow up.   Why: Someone from [facility]) will contact you to arrange date and time to resume your services. Contact information: [place] [address] [phone]                Allergies  Allergen Reactions   Aricept [Donepezil]     dizziness   Levetiracetam Other (See Comments)    AMS   Penicillins Hives   Viagra [Sildenafil]     dizziness    Consultations: None   Procedures/Studies: DG Chest 2 View  Result Date: 06/26/2021 CLINICAL DATA:  Syncope. EXAM: CHEST - 2 VIEW COMPARISON:  Portable chest 05/31/2021. FINDINGS: The cardiac size is normal. Stable mediastinum with aortic tortuosity, patchy calcific plaque. No vascular congestion is seen. There is chronic elevation  right hemidiaphragm with underlying atelectasis/scarring in the right lower lobe. The lungs clear of focal infiltrate. No pleural effusion is seen. Thoracic spondylosis. Overall recent seems unchanged. Again noted is a left chest dual lead pacing system with stable wire insertions. IMPRESSION: No active cardiopulmonary disease.  Stable chest. Electronically Signed   By: Telford Nab M.D.   On: 06/26/2021 06:30   CT HEAD WO CONTRAST (5MM)  Result Date: 06/26/2021 CLINICAL DATA:  81 year old male with history of syncopal episode at home. Head trauma. EXAM: CT HEAD WITHOUT CONTRAST TECHNIQUE: Contiguous axial images were obtained from the base of the skull through the vertex without intravenous contrast. RADIATION DOSE REDUCTION: This exam was performed according to the  departmental dose-optimization program which includes automated exposure control, adjustment of the mA and/or kV according to patient size and/or use of iterative reconstruction technique. COMPARISON:  Head CT 06/05/2021. FINDINGS: Brain: Mild cerebral atrophy with ex vacuo dilatation of the ventricular system. Patchy and confluent areas of decreased attenuation are noted throughout the deep and periventricular white matter of the cerebral hemispheres bilaterally, compatible with chronic microvascular ischemic disease. No evidence of acute infarction, hemorrhage, hydrocephalus, extra-axial collection or mass lesion/mass effect. Vascular: No hyperdense vessel or unexpected calcification. Skull: Normal. Negative for fracture or focal lesion. Sinuses/Orbits: No acute finding. Other: None. IMPRESSION: 1. No evidence of significant acute traumatic injury to the skull or brain. 2. Mild cerebral atrophy with extensive chronic microvascular ischemic changes in the cerebral white matter, similar to the prior study, as above. Electronically Signed   By: Vinnie Langton M.D.   On: 06/26/2021 07:19   CT Head Wo Contrast  Result Date: 06/05/2021 CLINICAL  DATA:  Severe headache.  Sudden onset.  Hypertension. EXAM: CT HEAD WITHOUT CONTRAST TECHNIQUE: Contiguous axial images were obtained from the base of the skull through the vertex without intravenous contrast. COMPARISON:  CT head 05/24/2021 FINDINGS: Brain: Mild ventricular enlargement, stable. This is associated with atrophy. Moderate white matter changes with diffuse white matter hypodensity, chronic and unchanged. Negative for acute infarct, hemorrhage, mass. Vascular: Negative for hyperdense vessel Skull: Negative Sinuses/Orbits: Mild mucosal edema paranasal sinuses. Bilateral cataract extraction. Other: None IMPRESSION: Atrophy and chronic microvascular ischemic change. No acute abnormality no change from the recent CT. Electronically Signed   By: Franchot Gallo M.D.   On: 06/05/2021 17:25   CT Cervical Spine Wo Contrast  Result Date: 06/26/2021 CLINICAL DATA:  Syncope, neck pain EXAM: CT CERVICAL SPINE WITHOUT CONTRAST TECHNIQUE: Multidetector CT imaging of the cervical spine was performed without intravenous contrast. Multiplanar CT image reconstructions were also generated. RADIATION DOSE REDUCTION: This exam was performed according to the departmental dose-optimization program which includes automated exposure control, adjustment of the mA and/or kV according to patient size and/or use of iterative reconstruction technique. COMPARISON:  06/05/2021 FINDINGS: Alignment: Alignment of posterior margins of vertebral bodies is unremarkable. Skull base and vertebrae: No recent fracture is seen. Soft tissues and spinal canal: There is extrinsic pressure over the ventral margin of thecal sac caused by posterior bony spurs and spinal stenosis at C5-C6 and C6-C7 levels. Disc levels: Degenerative changes are noted with bony spurs from C2-C7 levels. There is encroachment of neural foramina by bony spurs and facet hypertrophy from C2 to C7 levels, more so at C5-C6 and C6-C7 levels. Upper chest: Unremarkable. Other:  Thyroid is enlarged. IMPRESSION: No recent fracture is seen. 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 Enlarged thyroid. Electronically Signed   By: Elmer Picker M.D.   On: 06/26/2021 12:51   CT Cervical Spine Wo Contrast  Result Date: 06/05/2021 CLINICAL DATA:  Neck trauma. EXAM: CT CERVICAL SPINE WITHOUT CONTRAST TECHNIQUE: Multidetector CT imaging of the cervical spine was performed without intravenous contrast. Multiplanar CT image reconstructions were also generated. COMPARISON:  None. FINDINGS: Alignment: No acute subluxation. Skull base and vertebrae: No acute fracture. Soft tissues and spinal canal: No prevertebral fluid or swelling. No visible canal hematoma. Disc levels:  Multilevel degenerative changes. Upper chest: Negative. Other: Bilateral carotid bulb calcified plaques. IMPRESSION: 1. No acute/traumatic cervical spine pathology. 2. Multilevel degenerative changes. Electronically Signed   By: Laren Everts.D.  On: 06/05/2021 23:00   CT Angio Chest/Abd/Pel for Dissection W and/or Wo Contrast  Result Date: 06/26/2021 CLINICAL DATA:  Syncope.  Acute aortic syndrome. EXAM: CT ANGIOGRAPHY CHEST, ABDOMEN AND PELVIS TECHNIQUE: Non-contrast CT of the chest was initially obtained. Multidetector CT imaging through the chest, abdomen and pelvis was performed using the standard protocol during bolus administration of intravenous contrast. Multiplanar reconstructed images and MIPs were obtained and reviewed to evaluate the vascular anatomy. RADIATION DOSE REDUCTION: This exam was performed according to the departmental dose-optimization program which includes automated exposure control, adjustment of the mA and/or kV according to patient size and/or use of iterative reconstruction technique. CONTRAST:  30mL OMNIPAQUE IOHEXOL 350 MG/ML SOLN COMPARISON:  December 04, 2019. FINDINGS: CTA CHEST FINDINGS Cardiovascular:  Preferential opacification of the thoracic aorta. No evidence of thoracic aortic aneurysm or dissection. Normal heart size. No pericardial effusion. Mediastinum/Nodes: Stable diffuse thyroid enlargement. No adenopathy is noted. The esophagus is unremarkable. Lungs/Pleura: No pneumothorax or pleural effusion is noted. Mild bibasilar subsegmental atelectasis is noted. Musculoskeletal: No chest wall abnormality. No acute or significant osseous findings. Review of the MIP images confirms the above findings. CTA ABDOMEN AND PELVIS FINDINGS VASCULAR Aorta: Atherosclerosis of abdominal aorta is noted without aneurysm or dissection. Celiac: Patent without evidence of aneurysm, dissection, vasculitis or significant stenosis. SMA: Patent without evidence of aneurysm, dissection, vasculitis or significant stenosis. Renals: Both renal arteries are patent without evidence of aneurysm, dissection, vasculitis, fibromuscular dysplasia or significant stenosis. IMA: Patent without evidence of aneurysm, dissection, vasculitis or significant stenosis. Inflow: Patent without evidence of aneurysm, dissection, vasculitis or significant stenosis. Veins: No obvious venous abnormality within the limitations of this arterial phase study. Review of the MIP images confirms the above findings. NON-VASCULAR Hepatobiliary: No focal liver abnormality is seen. No gallstones, gallbladder wall thickening, or biliary dilatation. Pancreas: Unremarkable. No pancreatic ductal dilatation or surrounding inflammatory changes. Spleen: Normal in size without focal abnormality. Adrenals/Urinary Tract: Adrenal glands appear normal. Left renal cyst is noted. 1.7 cm possibly enhancing right renal lesion is noted. No hydronephrosis or renal obstruction is noted. Severely enlarged prostate gland is noted which is seen extending into the inferior posterior aspect of the urinary bladder. Stomach/Bowel: Stomach is within normal limits. Appendix appears normal. No  evidence of bowel wall thickening, distention, or inflammatory changes. Lymphatic: No adenopathy is noted. Reproductive: As noted above, severely enlarged prostate gland is noted which extends into the posterior and inferior aspect of the urinary bladder. Neoplasm cannot be excluded. Cystoscopy may be performed for further evaluation. Other: No abdominal wall hernia or abnormality. No abdominopelvic ascites. Musculoskeletal: No acute or significant osseous findings. Review of the MIP images confirms the above findings. IMPRESSION: No evidence of thoracic or abdominal aortic dissection or aneurysm. No definite evidence of mesenteric or renal artery stenosis. 1.7 cm possibly enhancing right renal lesion is noted. MRI with and without gadolinium is recommended to rule out potential neoplasm. Severely enlarged prostate gland is noted which extends into the posteroinferior aspect of the urinary bladder. Neoplasm cannot be excluded. Cystoscopy may be performed for further evaluation. Aortic Atherosclerosis (ICD10-I70.0). Electronically Signed   By: Marijo Conception M.D.   On: 06/26/2021 08:58     Subjective: He is feeling well, willing to go home  Discharge Exam: Vitals:   07/01/21 0321 07/01/21 0810  BP: 114/79 123/82  Pulse: 72 75  Resp: 15 16  Temp: 97.7 F (36.5 C) 97.6 F (36.4 C)  SpO2: 97% 98%     General:  Pt is alert, awake, not in acute distress Cardiovascular: RRR, S1/S2 +, no rubs, no gallops Respiratory: CTA bilaterally, no wheezing, no rhonchi Abdominal: Soft, NT, ND, bowel sounds + Extremities: no edema, no cyanosis    The results of significant diagnostics from this hospitalization (including imaging, microbiology, ancillary and laboratory) are listed below for reference.     Microbiology: Recent Results (from the past 240 hour(s))  Blood culture (routine x 2)     Status: None (Preliminary result)   Collection Time: 06/26/21  5:45 AM   Specimen: BLOOD RIGHT HAND  Result  Value Ref Range Status   Specimen Description BLOOD RIGHT HAND  Final   Special Requests   Final    BOTTLES DRAWN AEROBIC AND ANAEROBIC Blood Culture adequate volume   Culture   Final    NO GROWTH 4 DAYS Performed at [facility], [address], [place], [place] [address]    Report Status PENDING  Incomplete  Blood culture (routine x 2)     Status: None (Preliminary result)   Collection Time: 06/26/21  6:01 AM   Specimen: BLOOD  Result Value Ref Range Status   Specimen Description BLOOD LEFT ANTECUBITAL  Final   Special Requests AEROBIC BOTTLE ONLY Blood Culture adequate volume  Final   Culture   Final    NO GROWTH 4 DAYS Performed at [facility], [place] [address] [place], [place] [address]    Report Status PENDING  Incomplete  Resp Panel by RT-PCR (Flu A&B, Covid) Nasopharyngeal Swab     Status: None   Collection Time: 06/26/21  6:24 AM   Specimen: Nasopharyngeal Swab; Nasopharyngeal(NP) swabs in vial transport medium  Result Value Ref Range Status   SARS Coronavirus 2 by RT PCR NEGATIVE NEGATIVE Final    Comment: (NOTE) SARS-CoV-2 target nucleic acids are NOT DETECTED.  The SARS-CoV-2 RNA is generally detectable in upper respiratory specimens during the acute phase of infection. The lowest concentration of SARS-CoV-2 viral copies this assay can detect is 138 copies/mL. A negative result does not preclude SARS-Cov-2 infection and should not be used as the sole basis for treatment or other patient management decisions. A negative result may occur with  improper specimen collection/handling, submission of specimen other than nasopharyngeal swab, presence of viral mutation(s) within the areas targeted by this assay, and inadequate number of viral copies(<138 copies/mL). A negative result must be combined with clinical observations, patient history, and epidemiological information. The expected result is Negative.  Fact Sheet for Patients:   EntrepreneurPulse.com.au  Fact Sheet for Healthcare Providers:  IncredibleEmployment.be  This test is no t yet approved or cleared by the [place] FDA and  has been authorized for detection and/or diagnosis of SARS-CoV-2 by FDA under an Emergency Use Authorization (EUA). This EUA will remain  in effect (meaning this test can be used) for the duration of the COVID-19 declaration under Section 564(b)(1) of the Act, 21 U.S.C.section 360bbb-3(b)(1), unless the authorization is terminated  or revoked sooner.       Influenza A by PCR NEGATIVE NEGATIVE Final   Influenza B by PCR NEGATIVE NEGATIVE Final    Comment: (NOTE) The Xpert Xpress SARS-CoV-2/FLU/RSV plus assay is intended as an aid in the diagnosis of influenza from Nasopharyngeal swab specimens and should not be used as a sole basis for treatment. Nasal washings and aspirates are unacceptable for Xpert Xpress SARS-CoV-2/FLU/RSV testing.  Fact Sheet for Patients: EntrepreneurPulse.com.au  Fact Sheet for Healthcare Providers: IncredibleEmployment.be  This test  is not yet approved or cleared by the [place] and has been authorized for detection and/or diagnosis of SARS-CoV-2 by FDA under an Emergency Use Authorization (EUA). This EUA will remain in effect (meaning this test can be used) for the duration of the COVID-19 declaration under Section 564(b)(1) of the Act, 21 U.S.C. section 360bbb-3(b)(1), unless the authorization is terminated or revoked.  Performed at [facility], [place] [address], [place], [place] [address]   Urine Culture     Status: None   Collection Time: 06/26/21 12:59 PM   Specimen: Urine, Catheterized  Result Value Ref Range Status   Specimen Description URINE, CATHETERIZED  Final   Special Requests NONE  Final   Culture   Final    NO GROWTH Performed at [facility], [address] [place], [place]  [address]    Report Status 06/27/2021 FINAL  Final  C Difficile Quick Screen w PCR reflex     Status: Abnormal   Collection Time: 06/28/21  9:57 AM   Specimen: STOOL  Result Value Ref Range Status   C Diff antigen POSITIVE (A) NEGATIVE Final   C Diff toxin NEGATIVE NEGATIVE Final   C Diff interpretation Results are indeterminate. See PCR results.  Final    Comment: Performed at [facility], [place] [address], [place], [place] [address]  C. Diff by PCR, Reflexed     Status: Abnormal   Collection Time: 06/28/21  9:57 AM  Result Value Ref Range Status   Toxigenic C. Difficile by PCR POSITIVE (A) NEGATIVE Final    Comment: Positive for toxigenic C. difficile with little to no toxin production. Only treat if clinical presentation suggests symptomatic illness. Performed at [facility], [place] [address] [place], [place] [address]      Labs: BNP (last 3 results) Recent Labs    06/26/21 0545  BNP 681.2*   Basic Metabolic Panel: Recent Labs  Lab 06/26/21 0545 06/27/21 0500 06/28/21 1016 06/29/21 0100 06/30/21 0153  NA 140 138 138 139 137  K 4.4 3.5 3.2* 3.9 4.0  CL 102 106 104 105 102  CO2 24 26 30 28 28   GLUCOSE 140* 102* 144* 91 94  BUN 18 13 8 8  7*  CREATININE 1.76* 1.04 1.07 0.94 0.88  CALCIUM 10.1 9.2 9.1 9.2 9.4  MG  --   --   --  1.5* 1.9   Liver Function Tests: Recent Labs  Lab 06/26/21 0545  AST 27  ALT 50*  ALKPHOS 61  BILITOT 0.9  PROT 7.9  ALBUMIN 3.3*   No results for input(s): LIPASE, AMYLASE in the last 168 hours. No results for input(s): AMMONIA in the last 168 hours. CBC: Recent Labs  Lab 06/26/21 0545 06/27/21 0500 06/28/21 1016 06/30/21 0153  WBC 12.5* 7.1 4.8 6.2  NEUTROABS 11.3*  --   --   --   HGB 15.3 13.0 12.3* 13.7  HCT 47.6 38.4* 36.5* 40.2  MCV 96.4 92.8 91.3 90.3  PLT 227 187 177 184   Cardiac Enzymes: No results for input(s): CKTOTAL, CKMB, CKMBINDEX, TROPONINI in the last 168 hours. BNP: Invalid input(s):  POCBNP CBG: Recent Labs  Lab 06/26/21 0744  GLUCAP 132*   D-Dimer No results for input(s): DDIMER in the last 72 hours. Hgb A1c No results for input(s): HGBA1C in the last 72 hours. Lipid Profile No results for input(s): CHOL, HDL, LDLCALC, TRIG, CHOLHDL, LDLDIRECT in the last 72 hours. Thyroid function studies  No results for input(s): TSH, T4TOTAL, T3FREE, THYROIDAB in the last 72 hours.  Invalid input(s): FREET3 Anemia work up No results for input(s): VITAMINB12, FOLATE, FERRITIN, TIBC, IRON, RETICCTPCT in the last 72 hours. Urinalysis    Component Value Date/Time   COLORURINE YELLOW 06/26/2021 1259   APPEARANCEUR HAZY (A) 06/26/2021 1259   LABSPEC 1.025 06/26/2021 1259   PHURINE 5.0 06/26/2021 1259   GLUCOSEU NEGATIVE 06/26/2021 1259   GLUCOSEU NEGATIVE 02/17/2011 0930   HGBUR LARGE (A) 06/26/2021 1259   BILIRUBINUR NEGATIVE 06/26/2021 1259   KETONESUR NEGATIVE 06/26/2021 1259   PROTEINUR NEGATIVE 06/26/2021 1259   UROBILINOGEN 1.0 09/12/2012 1802   NITRITE NEGATIVE 06/26/2021 1259   LEUKOCYTESUR TRACE (A) 06/26/2021 1259   Sepsis Labs Invalid input(s): PROCALCITONIN,  WBC,  LACTICIDVEN Microbiology Recent Results (from the past 240 hour(s))  Blood culture (routine x 2)     Status: None (Preliminary result)   Collection Time: 06/26/21  5:45 AM   Specimen: BLOOD RIGHT HAND  Result Value Ref Range Status   Specimen Description BLOOD RIGHT HAND  Final   Special Requests   Final    BOTTLES DRAWN AEROBIC AND ANAEROBIC Blood Culture adequate volume   Culture   Final    NO GROWTH 4 DAYS Performed at [facility], [place] [address], [place], [place] [address]    Report Status PENDING  Incomplete  Blood culture (routine x 2)     Status: None (Preliminary result)   Collection Time: 06/26/21  6:01 AM   Specimen: BLOOD  Result Value Ref Range Status   Specimen Description BLOOD LEFT ANTECUBITAL  Final   Special Requests AEROBIC BOTTLE ONLY Blood Culture adequate  volume  Final   Culture   Final    NO GROWTH 4 DAYS Performed at [facility], [place] [address], [place], [place] [address]    Report Status PENDING  Incomplete  Resp Panel by RT-PCR (Flu A&B, Covid) Nasopharyngeal Swab     Status: None   Collection Time: 06/26/21  6:24 AM   Specimen: Nasopharyngeal Swab; Nasopharyngeal(NP) swabs in vial transport medium  Result Value Ref Range Status   SARS Coronavirus 2 by RT PCR NEGATIVE NEGATIVE Final    Comment: (NOTE) SARS-CoV-2 target nucleic acids are NOT DETECTED.  The SARS-CoV-2 RNA is generally detectable in upper respiratory specimens during the acute phase of infection. The lowest concentration of SARS-CoV-2 viral copies this assay can detect is 138 copies/mL. A negative result does not preclude SARS-Cov-2 infection and should not be used as the sole basis for treatment or other patient management decisions. A negative result may occur with  improper specimen collection/handling, submission of specimen other than nasopharyngeal swab, presence of viral mutation(s) within the areas targeted by this assay, and inadequate number of viral copies(<138 copies/mL). A negative result must be combined with clinical observations, patient history, and epidemiological information. The expected result is Negative.  Fact Sheet for Patients:  EntrepreneurPulse.com.au  Fact Sheet for Healthcare Providers:  IncredibleEmployment.be  This test is no t yet approved or cleared by the [place] FDA and  has been authorized for detection and/or diagnosis of SARS-CoV-2 by FDA under an Emergency Use Authorization (EUA). This EUA will remain  in effect (meaning this test can be used) for the duration of the COVID-19 declaration under Section 564(b)(1) of the Act, 21 U.S.C.section 360bbb-3(b)(1), unless the authorization is terminated  or revoked sooner.       Influenza A by PCR NEGATIVE NEGATIVE Final  Influenza B by PCR NEGATIVE NEGATIVE Final    Comment: (NOTE) The Xpert Xpress SARS-CoV-2/FLU/RSV plus assay is intended as an aid in the diagnosis of influenza from Nasopharyngeal swab specimens and should not be used as a sole basis for treatment. Nasal washings and aspirates are unacceptable for Xpert Xpress SARS-CoV-2/FLU/RSV testing.  Fact Sheet for Patients: EntrepreneurPulse.com.au  Fact Sheet for Healthcare Providers: IncredibleEmployment.be  This test is not yet approved or cleared by the [place] FDA and has been authorized for detection and/or diagnosis of SARS-CoV-2 by FDA under an Emergency Use Authorization (EUA). This EUA will remain in effect (meaning this test can be used) for the duration of the COVID-19 declaration under Section 564(b)(1) of the Act, 21 U.S.C. section 360bbb-3(b)(1), unless the authorization is terminated or revoked.  Performed at [facility], [place] [address], [place], [place] [address]   Urine Culture     Status: None   Collection Time: 06/26/21 12:59 PM   Specimen: Urine, Catheterized  Result Value Ref Range Status   Specimen Description URINE, CATHETERIZED  Final   Special Requests NONE  Final   Culture   Final    NO GROWTH Performed at [facility], [address], [place], [place] [address]    Report Status 06/27/2021 FINAL  Final  C Difficile Quick Screen w PCR reflex     Status: Abnormal   Collection Time: 06/28/21  9:57 AM   Specimen: STOOL  Result Value Ref Range Status   C Diff antigen POSITIVE (A) NEGATIVE Final   C Diff toxin NEGATIVE NEGATIVE Final   C Diff interpretation Results are indeterminate. See PCR results.  Final    Comment: Performed at [facility], [place] [address] [place], [place] [address]  C. Diff by PCR, Reflexed     Status: Abnormal   Collection Time: 06/28/21  9:57 AM  Result Value Ref Range Status   Toxigenic C. Difficile by PCR POSITIVE (A)  NEGATIVE Final    Comment: Positive for toxigenic C. difficile with little to no toxin production. Only treat if clinical presentation suggests symptomatic illness. Performed at [facility], [place] [address], [place], [place] [address]      Time coordinating discharge: 40 minutes  SIGNED:   Elmarie Shiley, MD  [facility]

## 2021-07-01 NOTE — TOC Benefit Eligibility Note (Signed)
Patient Teacher, English as a foreign language completed.    The patient is currently admitted and upon discharge could be taking Dificid 200 mg tablets.  Non Formulary  The patient is insured through [employer] Medicare Part D   Lyndel Safe, [place] Patient Advocate Specialist [place] Patient Advocate Team Direct Number: [phone]  Fax: [phone]

## 2021-07-01 NOTE — Progress Notes (Signed)
Occupational Therapy Treatment Patient Details Name: Dustin Hamilton MRN: [id number] DOB: Apr 08, 1941 Today's Date: 07/01/2021   History of present illness 81 y.o. male admitted 06/26/21 with recurrent diarrhea, low-grade fever. 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 diff); acute metabolic encephalopathy secondary to AKI and dehydration. Abdominal CT suspicious for R renal mass malignancy. 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   OT comments  Pt assisted OOB to bathroom to attempt to urinate and to sink for grooming with min guard assist and RW. Assisted with  posterior pericare.Changed gown with set up. Pt eager to go home, but aware he needs to urinate first.    Recommendations for follow up therapy are one component of a multi-disciplinary discharge planning process, led by the attending physician.  Recommendations may be updated based on patient status, additional functional criteria and insurance authorization.    Follow Up Recommendations  Home health OT    Assistance Recommended at Discharge Frequent or constant Supervision/Assistance  Patient can return home with the following  A little help with walking and/or transfers;A lot of help with bathing/dressing/bathroom;Assistance with cooking/housework;Assist for transportation   Equipment Recommendations  None recommended by OT    Recommendations for Other Services      Precautions / Restrictions Precautions Precautions: Fall Precaution Comments: enteric       Mobility Bed Mobility Overal bed mobility: Needs Assistance Bed Mobility: Supine to Sit     Supine to sit: Supervision     General bed mobility comments: HOB up    Transfers Overall transfer level: Needs assistance Equipment used: Rolling walker (2 wheels) Transfers: Sit to/from Stand Sit to Stand: Min guard            General transfer comment: increased time, but no physical assist     Balance Overall balance assessment: Needs assistance   Sitting balance-Leahy Scale: Fair     Standing balance support: Bilateral upper extremity supported, During functional activity Standing balance-Leahy Scale: Poor                             ADL either performed or assessed with clinical judgement   ADL Overall ADL's : Needs assistance/impaired     Grooming: Oral care;Standing;Min guard           Upper Body Dressing : Set up;Sitting       Toilet Transfer: Min guard;Ambulation;Rolling walker (2 wheels) Toilet Transfer Details (indicate cue type and reason): attempted to stand to urinate Toileting- Clothing Manipulation and Hygiene: Min guard       Functional mobility during ADLs: Min guard;Rolling walker (2 wheels)      Extremity/Trunk Assessment              Vision       Perception     Praxis      Cognition Arousal/Alertness: Awake/alert Behavior During Therapy: WFL for tasks assessed/performed Overall Cognitive Status: History of cognitive impairments - at baseline                                 General Comments: pt engaging in conversation appropriately, recalled what he ate for breakfast and that he needs to urinate to go home        Exercises      Shoulder Instructions  General Comments      Pertinent Vitals/ Pain       Pain Assessment Pain Assessment: No/denies pain  Home Living                                          Prior Functioning/Environment              Frequency  Min 2X/week        Progress Toward Goals  OT Goals(current goals can now be found in the care plan section)  Progress towards OT goals: Progressing toward goals  Acute Rehab OT Goals OT Goal Formulation: With patient Time For Goal Achievement: 07/11/21 Potential to Achieve Goals: Good  Plan Discharge plan remains  appropriate    Co-evaluation                 AM-PAC OT "6 Clicks" Daily Activity     Outcome Measure   Help from another person eating meals?: None Help from another person taking care of personal grooming?: A Little Help from another person toileting, which includes using toliet, bedpan, or urinal?: A Lot Help from another person bathing (including washing, rinsing, drying)?: A Lot Help from another person to put on and taking off regular upper body clothing?: A Little Help from another person to put on and taking off regular lower body clothing?: A Lot 6 Click Score: 16    End of Session Equipment Utilized During Treatment: Rolling walker (2 wheels);Gait belt  OT Visit Diagnosis: Unsteadiness on feet (R26.81);Other abnormalities of gait and mobility (R26.89);Muscle weakness (generalized) (M62.81)   Activity Tolerance Patient tolerated treatment well   Patient Left in chair;with call bell/phone within reach;with chair alarm set   Nurse Communication          Time: 9794-8016 OT Time Calculation (min): 24 min  Charges: OT General Charges $OT Visit: 1 Visit OT Treatments $Self Care/Home Management : 23-37 mins  Nestor Lewandowsky, OTR/L Acute Rehabilitation Services Pager: [phone] Office: [phone]   Malka So 07/01/2021, 10:30 AM

## 2021-07-01 NOTE — TOC Progression Note (Addendum)
Transition of Care [facility]) - Progression Note    Patient Details  Name: Dustin Hamilton MRN: [id number] Date of Birth: 1941/05/01  [facility] CM/SW Contact  Constant [place], Edson Snowball, RN Phone Number: 07/01/2021, 11:39 AM  Clinical Narrative:     Confirmed with Corene Cornea with  [facility] ([facility] patient is active for  for HHRN,PT/OT and speech. Will need orders and face to face Expected Discharge Plan: [place] Barriers to Discharge: Continued Medical Work up  Expected Discharge Plan and Services Expected Discharge Plan: [place] In-house Referral: NA Discharge Planning Services: CM Consult Post Acute Care Choice: Home Health, Resumption of Svcs/PTA Provider Living arrangements for the past 2 months: Single Family Home Expected Discharge Date: 07/01/21               DME Arranged: N/A DME Agency: NA       HH Arranged: RN, PT, OT, Nurse's Aide, Speech Therapy HH Agency: [place] ([facility]) Date HH Agency Contacted: 06/28/21 Time [place]: 9432 Representative spoke with at [place]: [place] Interventions    Readmission Risk Interventions No flowsheet data found.

## 2021-07-10 NOTE — Discharge Summary (Signed)
Physician Discharge Summary  Dustin Hamilton JJO:[id number] DOB: 1940/10/22 DOA: 06/26/2021   PCP: [facility]   Admit date: 06/26/2021 Discharge date: 07/01/2021   Admitted From: Home  Disposition:  Home    Recommendations for Outpatient Follow-up:  Follow up with PCP in 1-2 weeks Please obtain BMP/CBC in one week Follow up resolution of C diff.  Needs to follow up with urologist for renal mass and BPH   Home Health: Yes, PT   Discharge Condition: Stable.  CODE STATUS: Full code Diet recommendation: Heart Healthy      Brief/Interim Summary: 81 year old with past medical history significant for recent C. difficile colitis, BPH, advanced dementia, glaucoma, gout, PSVT status post ablation, PPM, chronic back pain who presents with recurrent diarrhea and low-grade fever.   Recently completed 10 days course of p.o. vancomycin 2 weeks prior to this admission.  Patient reported diarrhea has subsided.  However he started to have watery diarrhea again, not as foul smell as with C diff.  In the ED patient was found to be afebrile.  He did have a low-grade fever when EMS was called.  CT abdomen and pelvis with contrast showed no dissection.  Question of 1.7 cm right renal mass suspicious for malignancy, distended bladder, urology was consulted for difficult to place Foley catheter.  Patient had cloudy Foley catheter placed by urology.   He was also found to have lactic acidosis, lactic acid up to 4.5.  He received IV bolus.     1-SIRS; in setting of C diff.  Sepsis POA>  Presents with tachycardia, leukocytosis, low grade fever.  Blood cultures: no growth to date.  Chest x ray no active cardiopulmonary diseases.  UA; 6-10 WBC. urine culture: No growth.  Started on Dificid. Needs 7 more days of Tx.    AKI;  Obstructive uropathy possible BPH Cr peak to 1.7---cr down to 1.0 Improved with IV fluids.  Urology placed foley catheter.  Voiding trial today, if fail will need to  be discharge with foley.    Diarrhea:  Prior history of C diff.  Treated  with IV fluids.  C diff: Toxine positive.  Started Dificid 1/27. Needs 7 more days of Dificid.  Resolving.    Acute Metabolic encephalopathy:  CT head: no acute finding.  Resolved.    Near syncope.  Related to hypovolemia.  Continue with IV fluids.    History of SVT,  S/P PPM./ Ablation.  Continue Metoprolol.   Lactic acidosis;  Related to hypovolemia, hypoperfusion. Resolved with IV fluids.    Right Renal Mass;  Needs to follow up with urology/[facility].    Hypomagnesemia; Replaced.  Hypokalemia; Resolved.      Discharge Diagnoses:  Principal Problem:   Sepsis ([place]) Active Problems:   AKI (acute kidney injury) [facility])       Discharge Instructions   Discharge Instructions       Diet - low sodium heart healthy   Complete by: As directed      Increase activity slowly   Complete by: As directed           Allergies as of 07/01/2021         Reactions    Aricept [donepezil]      dizziness    Levetiracetam Other (See Comments)    AMS    Penicillins Hives    Viagra [sildenafil]      dizziness            Medication List  TAKE these medications     acetaminophen 500 MG tablet Commonly known as: TYLENOL Take 1,000 mg by mouth every 6 (six) hours as needed for fever or mild pain.    allopurinol 100 MG tablet Commonly known as: Zyloprim Take 1 tablet (100 mg total) by mouth daily.    atorvastatin 40 MG tablet Commonly known as: LIPITOR Take 0.5 tablets (20 mg total) by mouth daily.    brimonidine 0.2 % ophthalmic solution Commonly known as: ALPHAGAN Place 1 drop into both eyes 2 (two) times daily.    CLEAR EYES OP Apply 2 drops to eye daily as needed (dry eyes).    diclofenac Sodium 1 % Gel Commonly known as: VOLTAREN Apply 4 g topically 4 (four) times daily. What changed:  when to take this reasons to take this    dutasteride 0.5 MG capsule Commonly known as:  AVODART Take 0.5 mg by mouth daily.    ENSURE ACTIVE HEART HEALTH PO Take 1 Bottle by mouth daily as needed (appetite, nutrition).    fidaxomicin 200 MG Tabs tablet Commonly known as: DIFICID Take 1 tablet (200 mg total) by mouth 2 (two) times daily for 7 days.    latanoprost 0.005 % ophthalmic solution Commonly known as: XALATAN Place 1 drop into both eyes at bedtime.    lidocaine 5 % Commonly known as: LIDODERM Place 1 patch onto the skin daily as needed (back pain). Remove & Discard patch within 12 hours or as directed by MD    memantine 10 MG tablet Commonly known as: NAMENDA Take 20 mg by mouth at bedtime.    methocarbamol 500 MG tablet Commonly known as: ROBAXIN Take 1 tablet (500 mg total) by mouth 2 (two) times daily. What changed:  when to take this reasons to take this    metoprolol succinate 25 MG 24 hr tablet Commonly known as: TOPROL-XL Take 0.5 tablets (12.5 mg total) by mouth at bedtime.    tamsulosin 0.4 MG Caps capsule Commonly known as: FLOMAX Take 0.4 mg by mouth at bedtime.    tiZANidine 4 MG tablet Commonly known as: ZANAFLEX Take 4 mg by mouth 2 (two) times daily as needed for muscle spasms.             Follow-up [place], [facility] Follow up.   Why: Someone from [facility]) will contact you to arrange date and time to resume your services. Contact information: [place] [address] [phone]                             Allergies  Allergen Reactions   Aricept [Donepezil]        dizziness   Levetiracetam Other (See Comments)      AMS   Penicillins Hives   Viagra [Sildenafil]        dizziness      Consultations: None     Procedures/Studies: DG Chest 2 View   Result Date: 06/26/2021 CLINICAL DATA:  Syncope. EXAM: CHEST - 2 VIEW COMPARISON:  Portable chest 05/31/2021. FINDINGS: The cardiac size is normal. Stable mediastinum with aortic  tortuosity, patchy calcific plaque. No vascular congestion is seen. There is chronic elevation right hemidiaphragm with underlying atelectasis/scarring in the right lower lobe. The lungs clear of focal infiltrate. No pleural effusion is seen. Thoracic spondylosis. Overall recent seems unchanged. Again noted is a left chest dual lead  pacing system with stable wire insertions. IMPRESSION: No active cardiopulmonary disease.  Stable chest. Electronically Signed   By: Telford Nab M.D.   On: 06/26/2021 06:30    CT HEAD WO CONTRAST (5MM)   Result Date: 06/26/2021 CLINICAL DATA:  81 year old male with history of syncopal episode at home. Head trauma. EXAM: CT HEAD WITHOUT CONTRAST TECHNIQUE: Contiguous axial images were obtained from the base of the skull through the vertex without intravenous contrast. RADIATION DOSE REDUCTION: This exam was performed according to the departmental dose-optimization program which includes automated exposure control, adjustment of the mA and/or kV according to patient size and/or use of iterative reconstruction technique. COMPARISON:  Head CT 06/05/2021. FINDINGS: Brain: Mild cerebral atrophy with ex vacuo dilatation of the ventricular system. Patchy and confluent areas of decreased attenuation are noted throughout the deep and periventricular white matter of the cerebral hemispheres bilaterally, compatible with chronic microvascular ischemic disease. No evidence of acute infarction, hemorrhage, hydrocephalus, extra-axial collection or mass lesion/mass effect. Vascular: No hyperdense vessel or unexpected calcification. Skull: Normal. Negative for fracture or focal lesion. Sinuses/Orbits: No acute finding. Other: None. IMPRESSION: 1. No evidence of significant acute traumatic injury to the skull or brain. 2. Mild cerebral atrophy with extensive chronic microvascular ischemic changes in the cerebral white matter, similar to the prior study, as above. Electronically Signed   By: Vinnie Langton M.D.   On: 06/26/2021 07:19    CT Head Wo Contrast   Result Date: 06/05/2021 CLINICAL DATA:  Severe headache.  Sudden onset.  Hypertension. EXAM: CT HEAD WITHOUT CONTRAST TECHNIQUE: Contiguous axial images were obtained from the base of the skull through the vertex without intravenous contrast. COMPARISON:  CT head 05/24/2021 FINDINGS: Brain: Mild ventricular enlargement, stable. This is associated with atrophy. Moderate white matter changes with diffuse white matter hypodensity, chronic and unchanged. Negative for acute infarct, hemorrhage, mass. Vascular: Negative for hyperdense vessel Skull: Negative Sinuses/Orbits: Mild mucosal edema paranasal sinuses. Bilateral cataract extraction. Other: None IMPRESSION: Atrophy and chronic microvascular ischemic change. No acute abnormality no change from the recent CT. Electronically Signed   By: Franchot Gallo M.D.   On: 06/05/2021 17:25    CT Cervical Spine Wo Contrast   Result Date: 06/26/2021 CLINICAL DATA:  Syncope, neck pain EXAM: CT CERVICAL SPINE WITHOUT CONTRAST TECHNIQUE: Multidetector CT imaging of the cervical spine was performed without intravenous contrast. Multiplanar CT image reconstructions were also generated. RADIATION DOSE REDUCTION: This exam was performed according to the departmental dose-optimization program which includes automated exposure control, adjustment of the mA and/or kV according to patient size and/or use of iterative reconstruction technique. COMPARISON:  06/05/2021 FINDINGS: Alignment: Alignment of posterior margins of vertebral bodies is unremarkable. Skull base and vertebrae: No recent fracture is seen. Soft tissues and spinal canal: There is extrinsic pressure over the ventral margin of thecal sac caused by posterior bony spurs and spinal stenosis at C5-C6 and C6-C7 levels. Disc levels: Degenerative changes are noted with bony spurs from C2-C7 levels. There is encroachment of neural foramina by bony spurs and facet  hypertrophy from C2 to C7 levels, more so at C5-C6 and C6-C7 levels. Upper chest: Unremarkable. Other: Thyroid is enlarged. IMPRESSION: No recent fracture is seen. 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 Enlarged thyroid. Electronically Signed   By: Elmer Picker M.D.   On: 06/26/2021 12:51    CT Cervical Spine Wo Contrast   Result Date: 06/05/2021 CLINICAL DATA:  Neck trauma. EXAM: CT CERVICAL SPINE WITHOUT CONTRAST TECHNIQUE: Multidetector CT imaging of the cervical spine was performed without intravenous contrast. Multiplanar CT image reconstructions were also generated. COMPARISON:  None. FINDINGS: Alignment: No acute subluxation. Skull base and vertebrae: No acute fracture. Soft tissues and spinal canal: No prevertebral fluid or swelling. No visible canal hematoma. Disc levels:  Multilevel degenerative changes. Upper chest: Negative. Other: Bilateral carotid bulb calcified plaques. IMPRESSION: 1. No acute/traumatic cervical spine pathology. 2. Multilevel degenerative changes. Electronically Signed   By: Anner Crete M.D.   On: 06/05/2021 23:00    CT Angio Chest/Abd/Pel for Dissection W and/or Wo Contrast   Result Date: 06/26/2021 CLINICAL DATA:  Syncope.  Acute aortic syndrome. EXAM: CT ANGIOGRAPHY CHEST, ABDOMEN AND PELVIS TECHNIQUE: Non-contrast CT of the chest was initially obtained. Multidetector CT imaging through the chest, abdomen and pelvis was performed using the standard protocol during bolus administration of intravenous contrast. Multiplanar reconstructed images and MIPs were obtained and reviewed to evaluate the vascular anatomy. RADIATION DOSE REDUCTION: This exam was performed according to the departmental dose-optimization program which includes automated exposure control, adjustment of the mA and/or kV according to patient size and/or use of iterative reconstruction technique. CONTRAST:  4mL  OMNIPAQUE IOHEXOL 350 MG/ML SOLN COMPARISON:  December 04, 2019. FINDINGS: CTA CHEST FINDINGS Cardiovascular: Preferential opacification of the thoracic aorta. No evidence of thoracic aortic aneurysm or dissection. Normal heart size. No pericardial effusion. Mediastinum/Nodes: Stable diffuse thyroid enlargement. No adenopathy is noted. The esophagus is unremarkable. Lungs/Pleura: No pneumothorax or pleural effusion is noted. Mild bibasilar subsegmental atelectasis is noted. Musculoskeletal: No chest wall abnormality. No acute or significant osseous findings. Review of the MIP images confirms the above findings. CTA ABDOMEN AND PELVIS FINDINGS VASCULAR Aorta: Atherosclerosis of abdominal aorta is noted without aneurysm or dissection. Celiac: Patent without evidence of aneurysm, dissection, vasculitis or significant stenosis. SMA: Patent without evidence of aneurysm, dissection, vasculitis or significant stenosis. Renals: Both renal arteries are patent without evidence of aneurysm, dissection, vasculitis, fibromuscular dysplasia or significant stenosis. IMA: Patent without evidence of aneurysm, dissection, vasculitis or significant stenosis. Inflow: Patent without evidence of aneurysm, dissection, vasculitis or significant stenosis. Veins: No obvious venous abnormality within the limitations of this arterial phase study. Review of the MIP images confirms the above findings. NON-VASCULAR Hepatobiliary: No focal liver abnormality is seen. No gallstones, gallbladder wall thickening, or biliary dilatation. Pancreas: Unremarkable. No pancreatic ductal dilatation or surrounding inflammatory changes. Spleen: Normal in size without focal abnormality. Adrenals/Urinary Tract: Adrenal glands appear normal. Left renal cyst is noted. 1.7 cm possibly enhancing right renal lesion is noted. No hydronephrosis or renal obstruction is noted. Severely enlarged prostate gland is noted which is seen extending into the inferior posterior aspect  of the urinary bladder. Stomach/Bowel: Stomach is within normal limits. Appendix appears normal. No evidence of bowel wall thickening, distention, or inflammatory changes. Lymphatic: No adenopathy is noted. Reproductive: As noted above, severely enlarged prostate gland is noted which extends into the posterior and inferior aspect of the urinary bladder. Neoplasm cannot be excluded. Cystoscopy may be performed for further evaluation. Other: No abdominal wall hernia or abnormality. No abdominopelvic ascites. Musculoskeletal: No acute or significant osseous findings. Review of the MIP images confirms the above findings. IMPRESSION: No evidence of thoracic or abdominal aortic dissection or aneurysm. No definite evidence of mesenteric or renal artery stenosis. 1.7 cm possibly enhancing right renal lesion is noted. MRI with and without gadolinium is recommended to rule out potential neoplasm. Severely enlarged prostate  gland is noted which extends into the posteroinferior aspect of the urinary bladder. Neoplasm cannot be excluded. Cystoscopy may be performed for further evaluation. Aortic Atherosclerosis (ICD10-I70.0). Electronically Signed   By: Marijo Conception M.D.   On: 06/26/2021 08:58   Imaging Results        Subjective: He is feeling well, willing to go home   Discharge Exam:     Vitals:    07/01/21 0321 07/01/21 0810  BP: 114/79 123/82  Pulse: 72 75  Resp: 15 16  Temp: 97.7 F (36.5 C) 97.6 F (36.4 C)  SpO2: 97% 98%        General: Pt is alert, awake, not in acute distress Cardiovascular: RRR, S1/S2 +, no rubs, no gallops Respiratory: CTA bilaterally, no wheezing, no rhonchi Abdominal: Soft, NT, ND, bowel sounds + Extremities: no edema, no cyanosis       The results of significant diagnostics from this hospitalization (including imaging, microbiology, ancillary and laboratory) are listed below for reference.       Microbiology:        Recent Results (from the past 240 hour(s))   Blood culture (routine x 2)     Status: None (Preliminary result)    Collection Time: 06/26/21  5:45 AM    Specimen: BLOOD RIGHT HAND  Result Value Ref Range Status    Specimen Description BLOOD RIGHT HAND   Final    Special Requests     Final      BOTTLES DRAWN AEROBIC AND ANAEROBIC Blood Culture adequate volume    Culture     Final      NO GROWTH 4 DAYS Performed at [facility], [address], [place], [place] [address]      Report Status PENDING   Incomplete  Blood culture (routine x 2)     Status: None (Preliminary result)    Collection Time: 06/26/21  6:01 AM    Specimen: BLOOD  Result Value Ref Range Status    Specimen Description BLOOD LEFT ANTECUBITAL   Final    Special Requests AEROBIC BOTTLE ONLY Blood Culture adequate volume   Final    Culture     Final      NO GROWTH 4 DAYS Performed at [facility], [place] [address], [place], [place] [address]      Report Status PENDING   Incomplete  Resp Panel by RT-PCR (Flu A&B, Covid) Nasopharyngeal Swab     Status: None    Collection Time: 06/26/21  6:24 AM    Specimen: Nasopharyngeal Swab; Nasopharyngeal(NP) swabs in vial transport medium  Result Value Ref Range Status    SARS Coronavirus 2 by RT PCR NEGATIVE NEGATIVE Final      Comment: (NOTE) SARS-CoV-2 target nucleic acids are NOT DETECTED.   The SARS-CoV-2 RNA is generally detectable in upper respiratory specimens during the acute phase of infection. The lowest concentration of SARS-CoV-2 viral copies this assay can detect is 138 copies/mL. A negative result does not preclude SARS-Cov-2 infection and should not be used as the sole basis for treatment or other patient management decisions. A negative result may occur with  improper specimen collection/handling, submission of specimen other than nasopharyngeal swab, presence of viral mutation(s) within the areas targeted by this assay, and inadequate number of viral copies(<138 copies/mL). A negative result  must be combined with clinical observations, patient history, and epidemiological information. The expected result is Negative.   Fact Sheet for Patients:  EntrepreneurPulse.com.au   Fact  Sheet for Healthcare Providers:  IncredibleEmployment.be   This test is no t yet approved or cleared by the [place] FDA and  has been authorized for detection and/or diagnosis of SARS-CoV-2 by FDA under an Emergency Use Authorization (EUA). This EUA will remain  in effect (meaning this test can be used) for the duration of the COVID-19 declaration under Section 564(b)(1) of the Act, 21 U.S.C.section 360bbb-3(b)(1), unless the authorization is terminated  or revoked sooner.           Influenza A by PCR NEGATIVE NEGATIVE Final    Influenza B by PCR NEGATIVE NEGATIVE Final      Comment: (NOTE) The Xpert Xpress SARS-CoV-2/FLU/RSV plus assay is intended as an aid in the diagnosis of influenza from Nasopharyngeal swab specimens and should not be used as a sole basis for treatment. Nasal washings and aspirates are unacceptable for Xpert Xpress SARS-CoV-2/FLU/RSV testing.   Fact Sheet for Patients: EntrepreneurPulse.com.au   Fact Sheet for Healthcare Providers: IncredibleEmployment.be   This test is not yet approved or cleared by the [place] FDA and has been authorized for detection and/or diagnosis of SARS-CoV-2 by FDA under an Emergency Use Authorization (EUA). This EUA will remain in effect (meaning this test can be used) for the duration of the COVID-19 declaration under Section 564(b)(1) of the Act, 21 U.S.C. section 360bbb-3(b)(1), unless the authorization is terminated or revoked.   Performed at [facility], [place] [address], [place], [place] [address]    Urine Culture     Status: None    Collection Time: 06/26/21 12:59 PM    Specimen: Urine, Catheterized  Result Value Ref Range Status    Specimen  Description URINE, CATHETERIZED   Final    Special Requests NONE   Final    Culture     Final      NO GROWTH Performed at [facility], [address], [place], [place] [address]      Report Status 06/27/2021 FINAL   Final  C Difficile Quick Screen w PCR reflex     Status: Abnormal    Collection Time: 06/28/21  9:57 AM    Specimen: STOOL  Result Value Ref Range Status    C Diff antigen POSITIVE (A) NEGATIVE Final    C Diff toxin NEGATIVE NEGATIVE Final    C Diff interpretation Results are indeterminate. See PCR results.   Final      Comment: Performed at [facility], [place] [address], [place], [place] [address]  C. Diff by PCR, Reflexed     Status: Abnormal    Collection Time: 06/28/21  9:57 AM  Result Value Ref Range Status    Toxigenic C. Difficile by PCR POSITIVE (A) NEGATIVE Final      Comment: Positive for toxigenic C. difficile with little to no toxin production. Only treat if clinical presentation suggests symptomatic illness. Performed at [facility], [place] [address] [place], [place] [address]        Labs: BNP (last 3 results) Recent Labs (within last 365 days)     Recent Labs    06/26/21 0545  BNP 118.5*      Basic Metabolic Panel: Last Labs          Recent Labs  Lab 06/26/21 0545 06/27/21 0500 06/28/21 1016 06/29/21 0100 06/30/21 0153  NA 140 138 138 139 137  K 4.4 3.5 3.2* 3.9 4.0  CL 102 106 104 105 102  CO2 24 26  30 28 28   GLUCOSE 140* 102* 144* 91 94  BUN 18 13 8 8  7*  CREATININE 1.76* 1.04 1.07 0.94 0.88  CALCIUM 10.1 9.2 9.1 9.2 9.4  MG  --   --   --  1.5* 1.9      Liver Function Tests: Last Labs      Recent Labs  Lab 06/26/21 0545  AST 27  ALT 50*  ALKPHOS 61  BILITOT 0.9  PROT 7.9  ALBUMIN 3.3*      Last Labs   No results for input(s): LIPASE, AMYLASE in the last 168 hours.   Last Labs   No results for input(s): AMMONIA in the last 168 hours.   CBC: Last Labs         Recent Labs  Lab 06/26/21 0545  06/27/21 0500 06/28/21 1016 06/30/21 0153  WBC 12.5* 7.1 4.8 6.2  NEUTROABS 11.3*  --   --   --   HGB 15.3 13.0 12.3* 13.7  HCT 47.6 38.4* 36.5* 40.2  MCV 96.4 92.8 91.3 90.3  PLT 227 187 177 184      Cardiac Enzymes: Last Labs   No results for input(s): CKTOTAL, CKMB, CKMBINDEX, TROPONINI in the last 168 hours.   BNP: Last Labs   Invalid input(s): POCBNP   CBG: Last Labs      Recent Labs  Lab 06/26/21 0744  GLUCAP 132*      D-Dimer Recent Labs (last 2 labs)   No results for input(s): DDIMER in the last 72 hours.   Hgb A1c Recent Labs (last 2 labs)   No results for input(s): HGBA1C in the last 72 hours.   Lipid Profile Recent Labs (last 2 labs)   No results for input(s): CHOL, HDL, LDLCALC, TRIG, CHOLHDL, LDLDIRECT in the last 72 hours.   Thyroid function studies  Recent Labs (last 2 labs)   No results for input(s): TSH, T4TOTAL, T3FREE, THYROIDAB in the last 72 hours.   Invalid input(s): FREET3   Anemia work up [employer] (last 2 labs)   No results for input(s): VITAMINB12, FOLATE, FERRITIN, TIBC, IRON, RETICCTPCT in the last 72 hours.   Urinalysis Labs (Brief)          Component Value Date/Time    COLORURINE YELLOW 06/26/2021 1259    APPEARANCEUR HAZY (A) 06/26/2021 1259    LABSPEC 1.025 06/26/2021 1259    PHURINE 5.0 06/26/2021 1259    GLUCOSEU NEGATIVE 06/26/2021 1259    GLUCOSEU NEGATIVE 02/17/2011 0930    HGBUR LARGE (A) 06/26/2021 1259    BILIRUBINUR NEGATIVE 06/26/2021 1259    KETONESUR NEGATIVE 06/26/2021 1259    PROTEINUR NEGATIVE 06/26/2021 1259    UROBILINOGEN 1.0 09/12/2012 1802    NITRITE NEGATIVE 06/26/2021 1259    LEUKOCYTESUR TRACE (A) 06/26/2021 1259      Sepsis Labs Last Labs   Invalid input(s): PROCALCITONIN,  WBC,  LACTICIDVEN   Microbiology        Recent Results (from the past 240 hour(s))  Blood culture (routine x 2)     Status: None (Preliminary result)    Collection Time: 06/26/21  5:45 AM    Specimen: BLOOD  RIGHT HAND  Result Value Ref Range Status    Specimen Description BLOOD RIGHT HAND   Final    Special Requests     Final      BOTTLES DRAWN AEROBIC AND ANAEROBIC Blood Culture adequate volume    Culture     Final  NO GROWTH 4 DAYS Performed at [facility], [place] [address], [place], [place] [address]      Report Status PENDING   Incomplete  Blood culture (routine x 2)     Status: None (Preliminary result)    Collection Time: 06/26/21  6:01 AM    Specimen: BLOOD  Result Value Ref Range Status    Specimen Description BLOOD LEFT ANTECUBITAL   Final    Special Requests AEROBIC BOTTLE ONLY Blood Culture adequate volume   Final    Culture     Final      NO GROWTH 4 DAYS Performed at [facility], [place] [address] [place], [place] [address]      Report Status PENDING   Incomplete  Resp Panel by RT-PCR (Flu A&B, Covid) Nasopharyngeal Swab     Status: None    Collection Time: 06/26/21  6:24 AM    Specimen: Nasopharyngeal Swab; Nasopharyngeal(NP) swabs in vial transport medium  Result Value Ref Range Status    SARS Coronavirus 2 by RT PCR NEGATIVE NEGATIVE Final      Comment: (NOTE) SARS-CoV-2 target nucleic acids are NOT DETECTED.   The SARS-CoV-2 RNA is generally detectable in upper respiratory specimens during the acute phase of infection. The lowest concentration of SARS-CoV-2 viral copies this assay can detect is 138 copies/mL. A negative result does not preclude SARS-Cov-2 infection and should not be used as the sole basis for treatment or other patient management decisions. A negative result may occur with  improper specimen collection/handling, submission of specimen other than nasopharyngeal swab, presence of viral mutation(s) within the areas targeted by this assay, and inadequate number of viral copies(<138 copies/mL). A negative result must be combined with clinical observations, patient history, and epidemiological information. The expected result is  Negative.   Fact Sheet for Patients:  EntrepreneurPulse.com.au   Fact Sheet for Healthcare Providers:  IncredibleEmployment.be   This test is no t yet approved or cleared by the [place] FDA and  has been authorized for detection and/or diagnosis of SARS-CoV-2 by FDA under an Emergency Use Authorization (EUA). This EUA will remain  in effect (meaning this test can be used) for the duration of the COVID-19 declaration under Section 564(b)(1) of the Act, 21 U.S.C.section 360bbb-3(b)(1), unless the authorization is terminated  or revoked sooner.           Influenza A by PCR NEGATIVE NEGATIVE Final    Influenza B by PCR NEGATIVE NEGATIVE Final      Comment: (NOTE) The Xpert Xpress SARS-CoV-2/FLU/RSV plus assay is intended as an aid in the diagnosis of influenza from Nasopharyngeal swab specimens and should not be used as a sole basis for treatment. Nasal washings and aspirates are unacceptable for Xpert Xpress SARS-CoV-2/FLU/RSV testing.   Fact Sheet for Patients: EntrepreneurPulse.com.au   Fact Sheet for Healthcare Providers: IncredibleEmployment.be   This test is not yet approved or cleared by the [place] FDA and has been authorized for detection and/or diagnosis of SARS-CoV-2 by FDA under an Emergency Use Authorization (EUA). This EUA will remain in effect (meaning this test can be used) for the duration of the COVID-19 declaration under Section 564(b)(1) of the Act, 21 U.S.C. section 360bbb-3(b)(1), unless the authorization is terminated or revoked.   Performed at [facility], [place] [address], [place], [place] [address]    Urine Culture     Status: None    Collection Time: 06/26/21 12:59 PM    Specimen: Urine, Catheterized  Result Value Ref Range Status    Specimen Description URINE, CATHETERIZED   Final    Special Requests NONE   Final    Culture     Final      NO  GROWTH Performed at [facility], [address] [place], [place] [address]      Report Status 06/27/2021 FINAL   Final  C Difficile Quick Screen w PCR reflex     Status: Abnormal    Collection Time: 06/28/21  9:57 AM    Specimen: STOOL  Result Value Ref Range Status    C Diff antigen POSITIVE (A) NEGATIVE Final    C Diff toxin NEGATIVE NEGATIVE Final    C Diff interpretation Results are indeterminate. See PCR results.   Final      Comment: Performed at [facility], [place] [address], [place], [place] [address]  C. Diff by PCR, Reflexed     Status: Abnormal    Collection Time: 06/28/21  9:57 AM  Result Value Ref Range Status    Toxigenic C. Difficile by PCR POSITIVE (A) NEGATIVE Final      Comment: Positive for toxigenic C. difficile with little to no toxin production. Only treat if clinical presentation suggests symptomatic illness. Performed at [facility], [place] [address], [place], [place] [address]          Time coordinating discharge: 40 minutes   SIGNED:     Elmarie Shiley, MD           [facility]       Revision History

## 2021-07-27 ENCOUNTER — Emergency Department (HOSPITAL_BASED_OUTPATIENT_CLINIC_OR_DEPARTMENT_OTHER)
Admission: EM | Admit: 2021-07-27 | Discharge: 2021-07-27 | Disposition: A | Discharge status: Home / Self Care | Payer: No Typology Code available for payment source | Attending: Emergency Medicine | Admitting: Emergency Medicine

## 2021-07-27 ENCOUNTER — Encounter (HOSPITAL_BASED_OUTPATIENT_CLINIC_OR_DEPARTMENT_OTHER): Payer: Self-pay

## 2021-07-27 ENCOUNTER — Other Ambulatory Visit: Payer: Self-pay

## 2021-07-27 ENCOUNTER — Emergency Department (HOSPITAL_BASED_OUTPATIENT_CLINIC_OR_DEPARTMENT_OTHER): Payer: No Typology Code available for payment source

## 2021-07-27 DIAGNOSIS — Y9301 Activity, walking, marching and hiking: Secondary | ICD-10-CM | POA: Diagnosis not present

## 2021-07-27 DIAGNOSIS — Z20822 Contact with and (suspected) exposure to covid-19: Secondary | ICD-10-CM | POA: Diagnosis not present

## 2021-07-27 DIAGNOSIS — W19XXXA Unspecified fall, initial encounter: Secondary | ICD-10-CM

## 2021-07-27 DIAGNOSIS — I10 Essential (primary) hypertension: Secondary | ICD-10-CM | POA: Insufficient documentation

## 2021-07-27 DIAGNOSIS — Z79899 Other long term (current) drug therapy: Secondary | ICD-10-CM | POA: Insufficient documentation

## 2021-07-27 DIAGNOSIS — R531 Weakness: Secondary | ICD-10-CM | POA: Diagnosis not present

## 2021-07-27 DIAGNOSIS — M25561 Pain in right knee: Secondary | ICD-10-CM | POA: Diagnosis not present

## 2021-07-27 DIAGNOSIS — R197 Diarrhea, unspecified: Secondary | ICD-10-CM

## 2021-07-27 DIAGNOSIS — Y92009 Unspecified place in unspecified non-institutional (private) residence as the place of occurrence of the external cause: Secondary | ICD-10-CM | POA: Insufficient documentation

## 2021-07-27 DIAGNOSIS — W010XXA Fall on same level from slipping, tripping and stumbling without subsequent striking against object, initial encounter: Secondary | ICD-10-CM | POA: Diagnosis not present

## 2021-07-27 DIAGNOSIS — Z8616 Personal history of COVID-19: Secondary | ICD-10-CM | POA: Diagnosis not present

## 2021-07-27 LAB — COMPREHENSIVE METABOLIC PANEL
ALT: 15 U/L (ref 0–44)
AST: 12 U/L — ABNORMAL LOW (ref 15–41)
Albumin: 3.9 g/dL (ref 3.5–5.0)
Alkaline Phosphatase: 66 U/L (ref 38–126)
Anion gap: 7 (ref 5–15)
BUN: 16 mg/dL (ref 8–23)
CO2: 29 mmol/L (ref 22–32)
Calcium: 10.4 mg/dL — ABNORMAL HIGH (ref 8.9–10.3)
Chloride: 104 mmol/L (ref 98–111)
Creatinine, Ser: 0.93 mg/dL (ref 0.61–1.24)
GFR, Estimated: >60 mL/min (ref >60)
Glucose, Bld: 89 mg/dL (ref 70–99)
Potassium: 3.8 mmol/L (ref 3.5–5.1)
Sodium: 140 mmol/L (ref 135–145)
Total Bilirubin: 0.8 mg/dL (ref 0.3–1.2)
Total Protein: 7.3 g/dL (ref 6.5–8.1)

## 2021-07-27 LAB — CBC
HCT: 45.4 % (ref 39.0–52.0)
Hemoglobin: 14.7 g/dL (ref 13.0–17.0)
MCH: 30 pg (ref 26.0–34.0)
MCHC: 32.4 g/dL (ref 30.0–36.0)
MCV: 92.7 fL (ref 80.0–100.0)
Platelets: 157 10*3/uL (ref 150–400)
RBC: 4.9 MIL/uL (ref 4.22–5.81)
RDW: 15.1 % (ref 11.5–15.5)
WBC: 6.6 10*3/uL (ref 4.0–10.5)
nRBC: 0 % (ref 0.0–0.2)

## 2021-07-27 LAB — RESP PANEL BY RT-PCR (FLU A&B, COVID) ARPGX2
Influenza A by PCR: NEGATIVE
Influenza B by PCR: NEGATIVE
SARS Coronavirus 2 by RT PCR: NEGATIVE

## 2021-07-27 LAB — URINALYSIS, ROUTINE W REFLEX MICROSCOPIC
Bilirubin Urine: NEGATIVE
Glucose, UA: NEGATIVE mg/dL
Hgb urine dipstick: NEGATIVE
Ketones, ur: NEGATIVE mg/dL
Nitrite: NEGATIVE
Protein, ur: 30 mg/dL — AB
Specific Gravity, Urine: 1.019 (ref 1.005–1.030)
pH: 5.5 (ref 5.0–8.0)

## 2021-07-27 LAB — LIPASE, BLOOD: Lipase: <10 U/L — ABNORMAL LOW (ref 11–51)

## 2021-07-27 LAB — TROPONIN I (HIGH SENSITIVITY): Troponin I (High Sensitivity): 10 ng/L (ref <18)

## 2021-07-27 MED ORDER — SODIUM CHLORIDE 0.9 % IV BOLUS
1000.0000 mL | Freq: Once | INTRAVENOUS | Status: AC
  Administered 2021-07-27: 1000 mL via INTRAVENOUS

## 2021-07-27 MED ORDER — FIDAXOMICIN 200 MG PO TABS: 200.0000 mg | ORAL_TABLET | Freq: Two times a day (BID) | ORAL | 0 refills | Status: AC

## 2021-07-27 NOTE — ED Notes (Signed)
Pt is aware urine specimen is needed to be collected.

## 2021-07-27 NOTE — ED Triage Notes (Addendum)
Pt arrives POV with his wife and niece.  Per wife, pt started having diarrhea yesterday.  He was hospitalized January, 2023 with C-Diff.  Pt fell at home this am, denies LOC or injury related to fall.  He did not hit his head.  Wife brought him in due to recent C-diff hospitalization and because she is concerned he is dehydrated.

## 2021-07-27 NOTE — ED Provider Notes (Signed)
[facility] EMERGENCY DEPT Provider Note   CSN: [id number] Arrival date & time: 07/27/21  1058     History  Chief Complaint  Patient presents with   Dustin Hamilton is a 81 y.o. male.  PMH of CVA, HTN, Recent C diff, Recent COVID-19, and Recent UTI.  This patient presents emergency department chief complaint of diarrhea and a fall that happened today. Patient presents with his family who helps with history.  They state that he has been having difficult time lately with a COVID-19 diagnosis in December 2022 that quickly followed by a UTI.  In January 2023, patient ended up getting admitted for a C. difficile infection and AKI.  He since stopped antibiotic treatment for C. difficile on February 3.  He was completely resolved with the diarrhea until the diarrhea restarted yesterday.  Diarrhea is nonbloody.  He has had about 3-4 episodes of diarrhea since last night.  Family states that this morning that patient fell.  They state that he was walking with his walker and seemed like he tripped over his feet and he fell backwards onto his bottom.  He states that he has been getting weaker and weaker ever since his original COVID-19 diagnosis and is now having physical therapy and Occupational Therapy at home multiple times.  He denies any recent fever, chills, abdominal pain, chest pain, shortness of breath, back pain.  Since the fall, patient only complains of right knee pain.  He has not ambulated following this was not able to get up off the ground after the fall, however this is not unusual for him because he has been so weak.  He reportedly did not hit his head after the fall and did not have any loss of consciousness.  He is not on any blood thinners.   Fall Pertinent negatives include no abdominal pain.      Home Medications Prior to Admission medications   Medication Sig Start Date End Date Taking? Authorizing Provider  allopurinol (ZYLOPRIM) 100 MG tablet Take 1  tablet (100 mg total) by mouth daily. 04/05/21 04/05/22 Yes Pokhrel, Laxman, MD  brimonidine (ALPHAGAN) 0.2 % ophthalmic solution Place 1 drop into both eyes 2 (two) times daily.   Yes [provider]  dutasteride (AVODART) 0.5 MG capsule Take 0.5 mg by mouth daily.   Yes [provider]  fidaxomicin (DIFICID) 200 MG TABS tablet Take 1 tablet (200 mg total) by mouth 2 (two) times daily for 10 days. 07/27/21 08/06/21 Yes Florentine Diekman, Adora Fridge, PA-C  latanoprost (XALATAN) 0.005 % ophthalmic solution Place 1 drop into both eyes at bedtime.    Yes [provider]  memantine (NAMENDA) 10 MG tablet Take 20 mg by mouth at bedtime.   Yes [provider]  Nutritional Supplements ([place] PO) Take 1 Bottle by mouth daily as needed (appetite, nutrition).   Yes [provider]  tamsulosin (FLOMAX) 0.4 MG CAPS capsule Take 0.4 mg by mouth at bedtime.   Yes [provider]  acetaminophen (TYLENOL) 500 MG tablet Take 1,000 mg by mouth every 6 (six) hours as needed for fever or mild pain.    [provider]  atorvastatin (LIPITOR) 40 MG tablet Take 0.5 tablets (20 mg total) by mouth daily. Patient not taking: Reported on 07/27/2021 04/05/21   Flora Lipps, MD  diclofenac Sodium (VOLTAREN) 1 % GEL Apply 4 g topically 4 (four) times daily. Patient taking differently: Apply 4 g topically 4 (four) times  daily as needed (pain). 06/05/21   Deno Etienne, DO  lidocaine (LIDODERM) 5 % Place 1 patch onto the skin daily as needed (back pain). Remove & Discard patch within 12 hours or as directed by MD    [provider]  methocarbamol (ROBAXIN) 500 MG tablet Take 1 tablet (500 mg total) by mouth 2 (two) times daily. Patient taking differently: Take 500 mg by mouth 2 (two) times daily as needed for muscle spasms. 06/05/21   Deno Etienne, DO  metoprolol succinate (TOPROL-XL) 25 MG 24 hr tablet Take 0.5 tablets (12.5 mg total) by mouth at bedtime. 05/31/20    Lilland, Alana, DO  Naphazoline HCl (CLEAR EYES OP) Apply 2 drops to eye daily as needed (dry eyes).    [provider]  tiZANidine (ZANAFLEX) 4 MG tablet Take 4 mg by mouth 2 (two) times daily as needed for muscle spasms. 06/11/21   [provider]      Allergies    Aricept [donepezil], Levetiracetam, Penicillins, and Viagra [sildenafil]    Review of Systems   Review of Systems  Gastrointestinal:  Positive for diarrhea. Negative for abdominal distention, abdominal pain, nausea and vomiting.  Musculoskeletal:  Positive for arthralgias.  Neurological:  Positive for weakness.  All other systems reviewed and are negative.  Physical Exam Updated Vital Signs BP (!) 151/100 (BP Location: Right Arm)    Pulse 81    Temp 98.1 F (36.7 C) (Oral)    Resp 16    Ht 5\' 11"  (1.803 m)    Wt 66.7 kg    SpO2 96%    BMI 20.50 kg/m  Physical Exam Vitals and nursing note reviewed.  Constitutional:      General: He is not in acute distress.    Appearance: Normal appearance. He is ill-appearing. He is not toxic-appearing or diaphoretic.     Comments: Chronically ill appearing  HENT:     Head: Normocephalic and atraumatic.     Comments: No evidence of head trauma or ttp.    Right Ear: Tympanic membrane, ear canal and external ear normal. There is no impacted cerumen.     Left Ear: Tympanic membrane, ear canal and external ear normal. There is no impacted cerumen.     Ears:     Comments: No battles sign, hemotympanum, or ear drainage bilaterally.    Nose: Nose normal. No nasal deformity, congestion or rhinorrhea.     Comments: No nasal deformity or evidence of nasal septal hematoma    Mouth/Throat:     Lips: Pink. No lesions.     Mouth: Mucous membranes are moist. No injury, lacerations, oral lesions or angioedema.     Pharynx: Oropharynx is clear. Uvula midline. No pharyngeal swelling, oropharyngeal exudate, posterior oropharyngeal erythema or uvula swelling.     Comments: No  evidence of intraoral lesions, dental fracture, or tongue laceration. No posterior pharyngeal swelling. Eyes:     General: Gaze aligned appropriately. No scleral icterus.       Right eye: No discharge.        Left eye: No discharge.     Extraocular Movements: Extraocular movements intact.     Conjunctiva/sclera: Conjunctivae normal.     Right eye: Right conjunctiva is not injected. No exudate or hemorrhage.    Left eye: Left conjunctiva is not injected. No exudate or hemorrhage.    Pupils: Pupils are equal, round, and reactive to light.     Comments: No racoon eyes, conjunctival erythema, evidence of foreign body, or  periorbital edema bilaterally  Neck:     Comments: No neck edema. Trachea is midline. Normal phonation. No evidence of bruising or injury. Cardiovascular:     Rate and Rhythm: Normal rate and regular rhythm.     Pulses: Normal pulses.          Radial pulses are 2+ on the right side and 2+ on the left side.       Dorsalis pedis pulses are 2+ on the right side and 2+ on the left side.     Heart sounds: Normal heart sounds, S1 normal and S2 normal. Heart sounds not distant. No murmur heard.   No friction rub. No gallop. No S3 or S4 sounds.  Pulmonary:     Effort: Pulmonary effort is normal. No accessory muscle usage or respiratory distress.     Breath sounds: Normal breath sounds. No stridor. No wheezing, rhonchi or rales.     Comments: Equal breath rise. Trachea midline. Lung sounds clear bilaterally. No evidence of chest wall crepitus.  Chest:     Chest wall: No tenderness.  Abdominal:     General: Abdomen is flat. Bowel sounds are normal. There is no distension.     Palpations: Abdomen is soft. There is no mass or pulsatile mass.     Tenderness: There is no abdominal tenderness. There is no right CVA tenderness, left CVA tenderness, guarding or rebound.     Comments: Abdomen is soft, nontender. No peritoneal signs. No signs of bruising or other injury.  Musculoskeletal:         General: No swelling, tenderness, deformity or signs of injury. Normal range of motion.     Cervical back: Normal range of motion and neck supple. No tenderness.     Right lower leg: No edema.     Left lower leg: No edema.     Comments: C spine: No C spine midline ttp or stepoffs noted. No reproducible cervical muscle ttp. T spine: No T spine midline ttp or stepoff noted. No reproducible paraspinal muscle ttp. L spine: No L spine midline ttp or stepoff noted. No reproducible paraspinal muscle ttp.  Thoracic cage: No areas of ttp, stepoffs or deformity noted to anterior, lateral, and posterior wall of rib cage.  Upper extremities: No bilateral shoulder, elbow, or wrist ttp, deformity, or swelling noted. Able to range all joints without difficulty. No other bony tenderness to bilateral long bones.  Lower extremities: Pelvis feels stable. No ttp of bilateral, anterior, or posterior pelvis. Able to range hip bilaterally without pain or difficulty. No crepitus heard. No deformity, swelling, or ttp of bilateral knees or ankles. Able to range all joints without difficulty. No other bony tenderness to bilateral long bones.   Right knee: Pain to touch at lateral aspect. Crepitus heard when ranging. Ranging knee is painful for patient. He is able to range himself though. No swelling, redness, or skin changes.   Skin:    General: Skin is warm and dry.     Coloration: Skin is not jaundiced or pale.     Findings: No bruising, erythema, lesion or rash.     Comments: No bruising, lacerations, abrasions, or skin tears evident on skin.   Neurological:     General: No focal deficit present.     Mental Status: He is alert and oriented to person, place, and time.     GCS: GCS eye subscore is 4. GCS verbal subscore is 5. GCS motor subscore is 6.     Cranial  Nerves: No cranial nerve deficit.     Sensory: No sensory deficit.     Motor: No weakness.     Coordination: Coordination normal.     Gait: Gait  normal.     Comments: Alert and Oriented x 3 Speech clear with no aphasia Cranial Nerve testing - PERRLA. EOM intact. No Nystagmus - Facial Sensation grossly intact - No facial asymmetry - Uvula and Tongue Midline Motor: - 4/5 motor strength in all four extremities.      Psychiatric:        Mood and Affect: Mood normal.        Behavior: Behavior normal. Behavior is cooperative.    ED Results / Procedures / Treatments   Labs (all labs ordered are listed, but only abnormal results are displayed) Labs Reviewed  LIPASE, BLOOD - Abnormal; Notable for the following components:      Result Value   Lipase <10 (*)    All other components within normal limits  COMPREHENSIVE METABOLIC PANEL - Abnormal; Notable for the following components:   Calcium 10.4 (*)    AST 12 (*)    All other components within normal limits  URINALYSIS, ROUTINE W REFLEX MICROSCOPIC - Abnormal; Notable for the following components:   Protein, ur 30 (*)    Leukocytes,Ua MODERATE (*)    Bacteria, UA RARE (*)    All other components within normal limits  RESP PANEL BY RT-PCR (FLU A&B, COVID) ARPGX2  CBC  TROPONIN I (HIGH SENSITIVITY)  TROPONIN I (HIGH SENSITIVITY)    EKG None  Radiology DG Chest 1 View  Result Date: 07/27/2021 CLINICAL DATA:  Weakness.  Patient fell. EXAM: CHEST  1 VIEW COMPARISON:  06/26/2021 and older studies. FINDINGS: Cardiac silhouette normal in size.  No mediastinal or hilar masses. Stable elevated right hemidiaphragm. Clear lungs.  No convincing pleural effusion.  No pneumothorax. 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 Skeletal structures are grossly intact. IMPRESSION: No active disease. Electronically Signed   By: Lajean Manes M.D.   On: 07/27/2021 13:29   DG Knee Complete 4 Views Right  Result Date: 07/27/2021 CLINICAL DATA:  Fall.  Right knee pain. EXAM: RIGHT KNEE - COMPLETE 4+ VIEW COMPARISON:  None. FINDINGS: No evidence of fracture, dislocation, or joint  effusion. 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 Enthesopathic changes are also seen involving the patella. IMPRESSION: No acute findings. Osteoarthritis. Electronically Signed   By: Marlaine Hind M.D.   On: 07/27/2021 15:29   DG Hips Bilat W or Wo Pelvis 3-4 Views  Result Date: 07/27/2021 CLINICAL DATA:  Fall.  Weakness.  Hip pain. EXAM: DG HIP (WITH OR WITHOUT PELVIS) 3-4V BILAT COMPARISON:  None. FINDINGS: No fracture or bone lesion. Hip joints, SI joints and pubic symphysis are normally aligned. Soft tissues are unremarkable. IMPRESSION: No fracture or acute finding. Electronically Signed   By: Lajean Manes M.D.   On: 07/27/2021 13:28    Procedures Procedures  On telemetry  Medications Ordered in ED Medications  sodium chloride 0.9 % bolus 1,000 mL ( Intravenous Stopped 07/27/21 1442)    ED Course/ Medical Decision Making/ A&P Clinical Course as of 07/27/21 2339  Sat Jul 27, 2021  1413 Urine nonspecific, Rare bacteria, Moderate Leuks with 11-20 WBC, could be indicative of UTI [GL]  1655 Positive for C diff one month ago during hospitalization. Dificid treatment ended 2 weeks ago. Has recurrence of watery diarrhea for two days.  [GL]  1702 Spoke with GI:  If similar put back on dificid. F/u outpatietn [GL]    Clinical Course User Index [GL] Adolphus Birchwood, PA-C                           Medical Decision Making Problems Addressed: Acute pain of right knee: acute illness or injury Diarrhea of presumed infectious origin: acute illness or injury with systemic symptoms Fall: acute illness or injury  Amount and/or Complexity of Data Reviewed Independent Historian:     Details: sibling assists with hx External Data Reviewed: labs, radiology, ECG and notes. Labs: ordered. Decision-making details documented in ED Course. Radiology: ordered and independent interpretation performed. Decision-making details documented in ED  Course. ECG/medicine tests: ordered and independent interpretation performed. Decision-making details documented in ED Course.  Risk Prescription drug management.   This is a 81 y.o. male with a pertinent PMH of CVA, HTN, Recent C diff, Recent COVID-19, and Recent UTI who presents to the ED with multiple complaints including fall and diarrhea.   Initial Impression:   Fall: Patient fell today presumed to be due to a mechanical fall but patient has also been functionally declining over the past few weeks and has been very week. There was no evidence of syncope during fall. No LOC. Denies head injury. There's no indication for head CT. On exam, I cannot find any evidence of trauma other than right knee pain. He has ROM of this knee but crepitus is present. I don't have a high suspicion for underlying fracture but will get x ray. I will also obtain pelvic and chest xray since patient has been non ambulatory since event. Will also obtain cardiac workup for his weakness and screen as a cause of his fall.  Diarrhea:  Diarrhea has been ongoing for 2 days now. He had a recent hospitalization for C diff and AKI. He was put on Dificid and stopped taking two weeks ago. Symptoms had resolved and now have reoccurred. Per family, the stool looks and smells similar to previous C diff infection. I am concerned that he could have return of C diff or have developed complications such as AKI or electrolyte abnormality due to this. He does not have abdominal pain so I do not think I need to scan his abdomen. It has been too soon to retest since he had a positive test less than 30 days. Plan to get basic labs  Ddx: C diff reoccurrence, infectious diarrhea, viral diarrhea, medication induced, knee fracture, ligamental knee injury, osteoarthritis, AKI, electrolyte abnormality  Additional History:  Additional history obtained from sister External records from outside source obtained and reviewed including recent  [facility] Social Determinants of Health: n/a    I personally reviewed and interpreted all laboratory work and imaging . I agree with radiologist interpretation. Abnormal results outlined below.  CBC normal, CMP reassuring, Lipase negative, Troponin negative, UA has moderate LE, but this is not specific for UTI, negative for COVID/Flu, Knee XR reveals OA but no acute changes, CXR with no acute findings, Pelvis XR normal, EKG with SR  Cardiac Monitoring: The patient was maintained on a cardiac monitor.  I personally viewed and interpreted the cardiac monitored which showed an underlying rhythm of: NSR  Medications:   I ordered medication including IVF  for dehydration Reevaluation of the patient after these medicines showed that the patient improved I have reviewed the patients home medicines and have made adjustments as needed  Consultations:  I requested consultation with the GI,  and discussed lab and imaging findings as well as pertinent plan - they recommend: restarting treatment for C diff since it is consistent with prior infection. F/u with PCP.   Reevaluation:  After the interventions noted above, I reevaluated the patient and found that they have :improved  My Impression: Labs do not indicate AKI, electrolyte abnormality, or significant dehydration. I think there is a good likelihood that patient has recurrence of C diff. We will restart the antibiotic for 10 days. He needs to follow up at the [place].   Disposition:  After consideration of the diagnostic results and the patients response to treatment, I feel that the patent would benefit from outpatient follow up.  I have discussed this patient with my attending physician, Dr. Pearline Cables who has made changes to the plan accordingly.  Portions of this note were generated with [employer]. Dictation errors may occur despite best attempts at proofreading.        Final Clinical Impression(s) / ED  Diagnoses Final diagnoses:  Diarrhea of presumed infectious origin  Fall, initial encounter  Acute pain of right knee    Rx / DC Orders ED Discharge Orders          Ordered    fidaxomicin (DIFICID) 200 MG TABS tablet  2 times daily        07/27/21 1713              Roberts Bon, Adora Fridge, PA-C 40/98/11 [id number]    Campbell Stall P, DO 82/95/62 [phone]

## 2021-07-27 NOTE — ED Notes (Signed)
Patient transported to CT 

## 2021-07-27 NOTE — Discharge Instructions (Addendum)
I have sent the antibiotic to your pharmacy. Please pick this up and take as prescribed. You should follow up with the [facility] within the next week to see how you are doing. If you develop fevers, abdominal pain, or worsening symptoms before your follow up visit, please return to the ED.

## 2021-07-29 ENCOUNTER — Telehealth (HOSPITAL_BASED_OUTPATIENT_CLINIC_OR_DEPARTMENT_OTHER): Payer: Self-pay | Admitting: Student

## 2021-07-29 MED ORDER — FIDAXOMICIN 200 MG PO TABS: 200.0000 mg | ORAL_TABLET | Freq: Two times a day (BID) | ORAL | 0 refills | Status: DC

## 2021-07-29 NOTE — Telephone Encounter (Signed)
Note created to refill Dificid

## 2021-07-29 NOTE — ED Notes (Signed)
[name] spouse called regarding a prescription that was sent to his [facility]. It is not showing up as ready for her to pick up. After 30 mins on the phone with the [facility] and clinic it is confirmed that they can't see it on their end. EDP Kommor agreed to look into pt's visit 07/27/2021 and printed an actual prescription for spouse to pick up.

## 2021-08-06 ENCOUNTER — Other Ambulatory Visit: Payer: Self-pay

## 2021-08-06 ENCOUNTER — Encounter (HOSPITAL_BASED_OUTPATIENT_CLINIC_OR_DEPARTMENT_OTHER): Payer: Self-pay | Admitting: Emergency Medicine

## 2021-08-06 ENCOUNTER — Emergency Department (HOSPITAL_BASED_OUTPATIENT_CLINIC_OR_DEPARTMENT_OTHER)
Admission: EM | Admit: 2021-08-06 | Discharge: 2021-08-06 | Disposition: A | Discharge status: Home / Self Care | Payer: No Typology Code available for payment source | Attending: Emergency Medicine | Admitting: Emergency Medicine

## 2021-08-06 ENCOUNTER — Emergency Department (HOSPITAL_BASED_OUTPATIENT_CLINIC_OR_DEPARTMENT_OTHER): Payer: No Typology Code available for payment source

## 2021-08-06 DIAGNOSIS — M25551 Pain in right hip: Secondary | ICD-10-CM | POA: Diagnosis not present

## 2021-08-06 DIAGNOSIS — R4182 Altered mental status, unspecified: Secondary | ICD-10-CM | POA: Diagnosis not present

## 2021-08-06 DIAGNOSIS — M549 Dorsalgia, unspecified: Secondary | ICD-10-CM | POA: Diagnosis not present

## 2021-08-06 DIAGNOSIS — R519 Headache, unspecified: Secondary | ICD-10-CM | POA: Diagnosis not present

## 2021-08-06 DIAGNOSIS — M545 Low back pain, unspecified: Secondary | ICD-10-CM | POA: Diagnosis not present

## 2021-08-06 DIAGNOSIS — R52 Pain, unspecified: Secondary | ICD-10-CM

## 2021-08-06 MED ORDER — ACETAMINOPHEN 325 MG PO TABS
650.0000 mg | ORAL_TABLET | Freq: Once | ORAL | Status: AC
  Administered 2021-08-06: 650 mg via ORAL
  Filled 2021-08-06: qty 2

## 2021-08-06 MED ORDER — LIDOCAINE 5 % EX PTCH
1.0000 | MEDICATED_PATCH | CUTANEOUS | Status: DC
  Administered 2021-08-06: 1 via TRANSDERMAL
  Filled 2021-08-06: qty 1

## 2021-08-06 NOTE — ED Triage Notes (Signed)
Pt brought in by [facility], c/o right lower bacrk pain today, denies shob, denies numbness o tingling ?

## 2021-08-06 NOTE — ED Provider Notes (Signed)
[place] EMERGENCY DEPARTMENT Provider Note   CSN: [id number] Arrival date & time: 08/06/21  1237     History  Chief Complaint  Patient presents with   Back Pain    JAYZEN PAVER is a 81 y.o. male.  The history is provided by the patient.  Back Pain Location:  Lumbar spine Quality:  Aching Radiates to:  Does not radiate Pain severity:  Mild Onset quality:  Gradual Duration:  1 day Timing:  Intermittent Progression:  Waxing and waning Chronicity:  New Context comment:  Low back pain for the last day, no problems going to the bathroom, no falls, no nmbness or weakness Relieved by:  Nothing Worsened by:  Palpation Associated symptoms: no abdominal pain, no abdominal swelling, no bladder incontinence, no bowel incontinence, no chest pain, no dysuria, no fever, no headaches, no leg pain, no numbness, no paresthesias, no pelvic pain, no perianal numbness, no tingling, no weakness and no weight loss       Home Medications Prior to Admission medications   Medication Sig Start Date End Date Taking? Authorizing Provider  acetaminophen (TYLENOL) 500 MG tablet Take 1,000 mg by mouth every 6 (six) hours as needed for fever or mild pain.    [provider]  allopurinol (ZYLOPRIM) 100 MG tablet Take 1 tablet (100 mg total) by mouth daily. 04/05/21 04/05/22  Pokhrel, Corrie Mckusick, MD  atorvastatin (LIPITOR) 40 MG tablet Take 0.5 tablets (20 mg total) by mouth daily. Patient not taking: Reported on 07/27/2021 04/05/21   Flora Lipps, MD  brimonidine (ALPHAGAN) 0.2 % ophthalmic solution Place 1 drop into both eyes 2 (two) times daily.    [provider]  diclofenac Sodium (VOLTAREN) 1 % GEL Apply 4 g topically 4 (four) times daily. Patient taking differently: Apply 4 g topically 4 (four) times daily as needed (pain). 06/05/21   Deno Etienne, DO  dutasteride (AVODART) 0.5 MG capsule Take 0.5 mg by mouth daily.    [provider]  fidaxomicin (DIFICID) 200  MG TABS tablet Take 1 tablet (200 mg total) by mouth 2 (two) times daily for 10 days. 07/27/21 08/06/21  Loeffler, Adora Fridge, PA-C  fidaxomicin (DIFICID) 200 MG TABS tablet Take 1 tablet (200 mg total) by mouth 2 (two) times daily. 07/29/21   Kommor, Madison, MD  latanoprost (XALATAN) 0.005 % ophthalmic solution Place 1 drop into both eyes at bedtime.     [provider]  lidocaine (LIDODERM) 5 % Place 1 patch onto the skin daily as needed (back pain). Remove & Discard patch within 12 hours or as directed by MD    [provider]  memantine (NAMENDA) 10 MG tablet Take 20 mg by mouth at bedtime.    [provider]  methocarbamol (ROBAXIN) 500 MG tablet Take 1 tablet (500 mg total) by mouth 2 (two) times daily. Patient taking differently: Take 500 mg by mouth 2 (two) times daily as needed for muscle spasms. 06/05/21   Deno Etienne, DO  metoprolol succinate (TOPROL-XL) 25 MG 24 hr tablet Take 0.5 tablets (12.5 mg total) by mouth at bedtime. 05/31/20   Lilland, Alana, DO  Naphazoline HCl (CLEAR EYES OP) Apply 2 drops to eye daily as needed (dry eyes).    [provider]  Nutritional Supplements ([place] PO) Take 1 Bottle by mouth daily as needed (appetite, nutrition).    [provider]  tamsulosin (FLOMAX) 0.4 MG CAPS capsule Take 0.4 mg by mouth at bedtime.  [provider]  tiZANidine (ZANAFLEX) 4 MG tablet Take 4 mg by mouth 2 (two) times daily as needed for muscle spasms. 06/11/21   [provider]      Allergies    Aricept [donepezil], Levetiracetam, Penicillins, and Viagra [sildenafil]    Review of Systems   Review of Systems  Constitutional:  Negative for fever and weight loss.  Cardiovascular:  Negative for chest pain.  Gastrointestinal:  Negative for abdominal pain and bowel incontinence.  Genitourinary:  Negative for bladder incontinence, dysuria and pelvic pain.  Musculoskeletal:  Positive for back pain.   Neurological:  Negative for tingling, weakness, numbness, headaches and paresthesias.   Physical Exam Updated Vital Signs BP (!) 163/93 (BP Location: Right Arm)    Pulse 67    Temp 98.4 F (36.9 C) (Oral)    Resp 16    Ht '5\' 11"'$  (1.803 m)    Wt 68.1 kg    SpO2 98%    BMI 20.93 kg/m  Physical Exam Vitals and nursing note reviewed.  Constitutional:      General: He is not in acute distress.    Appearance: He is well-developed. He is not ill-appearing.  HENT:     Head: Normocephalic and atraumatic.  Eyes:     Extraocular Movements: Extraocular movements intact.     Conjunctiva/sclera: Conjunctivae normal.     Pupils: Pupils are equal, round, and reactive to light.  Cardiovascular:     Rate and Rhythm: Normal rate and regular rhythm.     Pulses: Normal pulses.     Heart sounds: Normal heart sounds. No murmur heard. Pulmonary:     Effort: Pulmonary effort is normal. No respiratory distress.     Breath sounds: Normal breath sounds.  Abdominal:     Palpations: Abdomen is soft.     Tenderness: There is no abdominal tenderness.  Musculoskeletal:        General: Tenderness present. No swelling.     Cervical back: Normal range of motion and neck supple.     Comments: Tenderness to the paraspinal lumbar muscles on the right, no midline spinal tenderness  Skin:    General: Skin is warm and dry.     Capillary Refill: Capillary refill takes less than 2 seconds.  Neurological:     General: No focal deficit present.     Mental Status: He is alert and oriented to person, place, and time.     Cranial Nerves: No cranial nerve deficit.     Sensory: No sensory deficit.     Motor: No weakness.     Coordination: Coordination normal.     Comments: 5+ out of 5 strength throughout, normal sensation, no drift, good range of motion of the lower legs without much discomfort  Psychiatric:        Mood and Affect: Mood normal.    ED Results / Procedures / Treatments   Labs (all labs ordered are  listed, but only abnormal results are displayed) Labs Reviewed - No data to display  EKG None  Radiology DG Lumbar Spine Complete  Result Date: 08/06/2021 CLINICAL DATA:  Low back pain and right hip pain EXAM: LUMBAR SPINE - COMPLETE 4+ VIEW COMPARISON:  CTA abdomen/pelvis 06/26/2021 FINDINGS: There are 5 non-rib-bearing lumbar type vertebral bodies. Minimal anterior wedge deformity of the L1 and L2 vertebral bodies is similar to the prior CT. There is no evidence of acute fracture. There is straightening of the normal lumbar lordosis. There is no antero or retrolisthesis.  There is no evidence of spondylolysis. There is mild disc space narrowing at T11-T12, T12-L1, and L2-L3 with associated endplate osteophytes. There is mild facet arthropathy in the lower lumbar spine. The soft tissues are unremarkable. IMPRESSION: Mild degenerative changes in the lower thoracic spine and at L2-L3. No evidence of acute injury. Electronically Signed   By: Valetta Mole M.D.   On: 08/06/2021 13:31   CT Head Wo Contrast  Result Date: 08/06/2021 CLINICAL DATA:  Headache, altered mental status EXAM: CT HEAD WITHOUT CONTRAST TECHNIQUE: Contiguous axial images were obtained from the base of the skull through the vertex without intravenous contrast. RADIATION DOSE REDUCTION: This exam was performed according to the departmental dose-optimization program which includes automated exposure control, adjustment of the mA and/or kV according to patient size and/or use of iterative reconstruction technique. COMPARISON:  Brain MRI 04/01/2021, CT head 06/26/2021 FINDINGS: Brain: There is no evidence of acute intracranial hemorrhage, extra-axial fluid collection, or acute infarct. There is moderate global parenchymal volume loss with prominence of the ventricular system and extra-axial CSF spaces, unchanged. The ventricles are stable in size compared to the prior CT. Confluent hypodensity throughout the subcortical and periventricular  white matter likely reflects sequela of advanced chronic white matter microangiopathy, also not significantly changed. There is no mass lesion.  There is no mass effect or midline shift. Vascular: There is calcification of the bilateral cavernous ICAs and vertebral arteries. Skull: Normal. Negative for fracture or focal lesion. Sinuses/Orbits: The imaged paranasal sinuses are clear. The imaged globes and orbits are unremarkable. Other: The mastoid air cells are clear. IMPRESSION: No acute intracranial pathology. No significant change since 06/26/2021. Electronically Signed   By: Valetta Mole M.D.   On: 08/06/2021 13:35   DG HIP UNILAT WITH PELVIS 2-3 VIEWS RIGHT  Result Date: 08/06/2021 CLINICAL DATA:  Low back pain and right hip pain. EXAM: DG HIP (WITH OR WITHOUT PELVIS) 2-3V RIGHT COMPARISON:  07/27/2021 FINDINGS: Right hip is located without a fracture. Degenerative changes along the superolateral aspect of the right hip joint appears stable. Visualized pelvic bony ring is intact. IMPRESSION: No acute bone abnormality to the right hip. Electronically Signed   By: Markus Daft M.D.   On: 08/06/2021 13:29    Procedures Procedures    Medications Ordered in ED Medications  lidocaine (LIDODERM) 5 % 1 patch (1 patch Transdermal Patch Applied 08/06/21 1332)  acetaminophen (TYLENOL) tablet 650 mg (650 mg Oral Given 08/06/21 1332)    ED Course/ Medical Decision Making/ A&P                           Medical Decision Making Amount and/or Complexity of Data Reviewed Radiology: ordered.  Risk OTC drugs. Prescription drug management.   [name] is here with back pain.  History of stroke, high blood pressure.  No trauma history.  Normal vitals.  No fever.  Pain to the right lower back for about a day.  Normal strength and sensation on exam.  He is tender to the paraspinal lumbar muscles on the left.  No midline spinal tenderness.  No difficulty going to the bathroom.  No urinary retention.  No  saddle anesthesia.  Differential diagnosis possibly compression fracture versus muscle spasm versus strain.  No concern for cauda equina or other spinal cord process.  Neurovascular neuromuscular intact.  Good pulses in his lower extremities.  No concern for peripheral arterial process.  No concern for DVT.  We will  get x-ray of his low back and left hip.  Will give Tylenol lidocaine patch and reevaluate.  Talked on the phone with his wife.  He does have some memory problems.  Was also complained of a headache this morning.  He does not have headache currently.  However may be concern for fall and will get head CT to further rule out injury as well as x-rays.  Patient overall looks very comfortable.  No distress.  X-rays of the hip and low back are unremarkable.  No acute fracture per radiology interpretation.  I reviewed and interpreted the x-ray.  Myself as well.  Review of head CT per my interpretation shows no head bleed.  This is confirmed with radiology report as there are no acute findings of the head CT.  Patient overall is feeling well.  Suspect muscular process.  Discharged in good condition.  Recommend Tylenol and lidocaine patch.  This chart was dictated using voice recognition software.  Despite best efforts to proofread,  errors can occur which can change the documentation meaning.         Final Clinical Impression(s) / ED Diagnoses Final diagnoses:  Acute low back pain, unspecified back pain laterality, unspecified whether sciatica present  Nonintractable headache, unspecified chronicity pattern, unspecified headache type    Rx / DC Orders ED Discharge Orders     None         Lennice Sites, DO 08/06/21 1342

## 2021-08-06 NOTE — Discharge Instructions (Signed)
Recommend lidocaine patches, recommend 1000 mg of Tylenol every 6 hours as needed for pain. ?

## 2021-08-12 ENCOUNTER — Emergency Department (HOSPITAL_COMMUNITY)
Admission: EM | Admit: 2021-08-12 | Discharge: 2021-08-13 | Disposition: A | Discharge status: Home / Self Care | Payer: No Typology Code available for payment source | Attending: Emergency Medicine | Admitting: Emergency Medicine

## 2021-08-12 ENCOUNTER — Emergency Department (HOSPITAL_COMMUNITY): Payer: No Typology Code available for payment source

## 2021-08-12 ENCOUNTER — Encounter (HOSPITAL_COMMUNITY): Payer: Self-pay | Admitting: Emergency Medicine

## 2021-08-12 DIAGNOSIS — M542 Cervicalgia: Secondary | ICD-10-CM | POA: Diagnosis not present

## 2021-08-12 DIAGNOSIS — M47892 Other spondylosis, cervical region: Secondary | ICD-10-CM | POA: Diagnosis not present

## 2021-08-12 DIAGNOSIS — G4486 Cervicogenic headache: Secondary | ICD-10-CM | POA: Diagnosis not present

## 2021-08-12 DIAGNOSIS — M47812 Spondylosis without myelopathy or radiculopathy, cervical region: Secondary | ICD-10-CM

## 2021-08-12 DIAGNOSIS — R519 Headache, unspecified: Secondary | ICD-10-CM | POA: Diagnosis not present

## 2021-08-12 LAB — BASIC METABOLIC PANEL
Anion gap: 9 (ref 5–15)
BUN: 14 mg/dL (ref 8–23)
CO2: 26 mmol/L (ref 22–32)
Calcium: 10.1 mg/dL (ref 8.9–10.3)
Chloride: 103 mmol/L (ref 98–111)
Creatinine, Ser: 0.81 mg/dL (ref 0.61–1.24)
GFR, Estimated: >60 mL/min (ref >60)
Glucose, Bld: 101 mg/dL — ABNORMAL HIGH (ref 70–99)
Potassium: 3.9 mmol/L (ref 3.5–5.1)
Sodium: 138 mmol/L (ref 135–145)

## 2021-08-12 LAB — CBC WITH DIFFERENTIAL/PLATELET
Abs Immature Granulocytes: 0.01 10*3/uL (ref 0.00–0.07)
Basophils Absolute: 0 10*3/uL (ref 0.0–0.1)
Basophils Relative: 1 %
Eosinophils Absolute: 0.1 10*3/uL (ref 0.0–0.5)
Eosinophils Relative: 2 %
HCT: 46.3 % (ref 39.0–52.0)
Hemoglobin: 14.9 g/dL (ref 13.0–17.0)
Immature Granulocytes: 0 %
Lymphocytes Relative: 21 %
Lymphs Abs: 1.2 10*3/uL (ref 0.7–4.0)
MCH: 29.8 pg (ref 26.0–34.0)
MCHC: 32.2 g/dL (ref 30.0–36.0)
MCV: 92.6 fL (ref 80.0–100.0)
Monocytes Absolute: 0.4 10*3/uL (ref 0.1–1.0)
Monocytes Relative: 7 %
Neutro Abs: 4.1 10*3/uL (ref 1.7–7.7)
Neutrophils Relative %: 69 %
Platelets: 165 10*3/uL (ref 150–400)
RBC: 5 MIL/uL (ref 4.22–5.81)
RDW: 15.1 % (ref 11.5–15.5)
WBC: 5.9 10*3/uL (ref 4.0–10.5)
nRBC: 0 % (ref 0.0–0.2)

## 2021-08-12 MED ORDER — KETOROLAC TROMETHAMINE 30 MG/ML IJ SOLN
15.0000 mg | Freq: Once | INTRAMUSCULAR | Status: AC
  Administered 2021-08-12: 15 mg via INTRAMUSCULAR
  Filled 2021-08-12: qty 1

## 2021-08-12 MED ORDER — PREDNISONE 10 MG PO TABS: ORAL_TABLET | ORAL | 0 refills | Status: DC

## 2021-08-12 MED ORDER — TRAMADOL-ACETAMINOPHEN 37.5-325 MG PO TABS
1.0000 | ORAL_TABLET | Freq: Once | ORAL | Status: AC
  Administered 2021-08-12: 1 via ORAL
  Filled 2021-08-12 (×2): qty 1

## 2021-08-12 NOTE — ED Notes (Signed)
Waiting for Ultracet from pharmacy, this RN messaged pharm about sending medication. ?

## 2021-08-12 NOTE — ED Triage Notes (Signed)
Patient BIB wife, sent to ED from [place] for a headache started a week ago and a CT that showed hydrocephalus. Per wife's patient's slow verbal response is baseline. Patient is alert, oriented, and in no apparent distress at this time. ?

## 2021-08-12 NOTE — ED Notes (Signed)
Patient verbalizes understanding of d/c instructions. Opportunities for questions and answers were provided. Pt d/c from ED and wheeled to lobby where wife is picking pt up. ?

## 2021-08-12 NOTE — Discharge Instructions (Signed)
It appears that his headaches are being caused by pinched nerves and arthritis in the neck.  To help this we are prescribing prednisone.  He will also help to use heat on the sore area 3-4 times a day.  After he is done taking the prednisone, you can try using ibuprofen, 400 mg 2 or 3 times a day to help with pain crises.  Follow-up with his medical providers at the [place]. ?

## 2021-08-12 NOTE — ED Notes (Signed)
Patient transported to CT 

## 2021-08-12 NOTE — ED Provider Notes (Signed)
[place] EMERGENCY DEPARTMENT Provider Note   CSN: [id number] Arrival date & time: 08/12/21  1529     History  Chief Complaint  Patient presents with   Headache    Dustin Hamilton is a 81 y.o. male.  HPI He is here for evaluation of ongoing headache, present for at least a month.  Initially, he complained of pain in the lower neck and left shoulder region, more recently he has been complaining of pain in the forehead, the pain tends to come and go.  Today he was at his cardiologist office when he complained of a headache.  He was worked up initially for plications of cardiac pulmonary disorder with clinical evaluation chest x-ray.  There were no problems found so he was sent to another clinic at the [facility] to be evaluated for his headache.  He then had a CT of the head and because of the results he was sent here for further evaluation.  Apparently their concern was for abnormal head CT indicating hydrocephalus.  Patient has chronic stable hydrocephalus, on multiple images done here previously.  He has had a small brain bleed, in October 2022 that was managed medically.  He has had intervening multiple issues.  He is debilitated, has dementia, and has been worse over the last month as far as mentation and mobility according to his wife who is with him at the bedside.  He urinates frequently, but is able to control his urine stream during the day and at nighttime uses a diaper.  There is been no problems with eating.  There is been no known fever.  No cough, dysuria, shortness of breath according to wife who gives the history.    Home Medications Prior to Admission medications   Medication Sig Start Date End Date Taking? Authorizing Provider  predniSONE (DELTASONE) 10 MG tablet Take q day 6,5,4,3,2,1 08/12/21  Yes Daleen Bo, MD  acetaminophen (TYLENOL) 500 MG tablet Take 1,000 mg by mouth every 6 (six) hours as needed for fever or mild pain.    [provider]   allopurinol (ZYLOPRIM) 100 MG tablet Take 1 tablet (100 mg total) by mouth daily. 04/05/21 04/05/22  Pokhrel, Corrie Mckusick, MD  atorvastatin (LIPITOR) 40 MG tablet Take 0.5 tablets (20 mg total) by mouth daily. Patient not taking: Reported on 07/27/2021 04/05/21   Flora Lipps, MD  brimonidine (ALPHAGAN) 0.2 % ophthalmic solution Place 1 drop into both eyes 2 (two) times daily.    [provider]  diclofenac Sodium (VOLTAREN) 1 % GEL Apply 4 g topically 4 (four) times daily. Patient taking differently: Apply 4 g topically 4 (four) times daily as needed (pain). 06/05/21   Deno Etienne, DO  dutasteride (AVODART) 0.5 MG capsule Take 0.5 mg by mouth daily.    [provider]  fidaxomicin (DIFICID) 200 MG TABS tablet Take 1 tablet (200 mg total) by mouth 2 (two) times daily. 07/29/21   Kommor, Madison, MD  latanoprost (XALATAN) 0.005 % ophthalmic solution Place 1 drop into both eyes at bedtime.     [provider]  lidocaine (LIDODERM) 5 % Place 1 patch onto the skin daily as needed (back pain). Remove & Discard patch within 12 hours or as directed by MD    [provider]  memantine (NAMENDA) 10 MG tablet Take 20 mg by mouth at bedtime.    [provider]  methocarbamol (ROBAXIN) 500 MG tablet Take 1 tablet (500 mg total) by mouth 2 (two) times daily.  Patient taking differently: Take 500 mg by mouth 2 (two) times daily as needed for muscle spasms. 06/05/21   Deno Etienne, DO  metoprolol succinate (TOPROL-XL) 25 MG 24 hr tablet Take 0.5 tablets (12.5 mg total) by mouth at bedtime. 05/31/20   Lilland, Alana, DO  Naphazoline HCl (CLEAR EYES OP) Apply 2 drops to eye daily as needed (dry eyes).    [provider]  Nutritional Supplements ([place] PO) Take 1 Bottle by mouth daily as needed (appetite, nutrition).    [provider]  tamsulosin (FLOMAX) 0.4 MG CAPS capsule Take 0.4 mg by mouth at bedtime.    [provider]  tiZANidine  (ZANAFLEX) 4 MG tablet Take 4 mg by mouth 2 (two) times daily as needed for muscle spasms. 06/11/21   [provider]      Allergies    Aricept [donepezil], Levetiracetam, Penicillins, and Viagra [sildenafil]    Review of Systems   Review of Systems  Physical Exam Updated Vital Signs BP 128/82    Pulse 61    Temp 98.1 F (36.7 C) (Oral)    Resp 15    SpO2 96%  Physical Exam Vitals and nursing note reviewed.  Constitutional:      General: He is not in acute distress.    Appearance: He is well-developed. He is not ill-appearing or diaphoretic.     Comments: He appears appropriate nourished for age.  HENT:     Head: Normocephalic and atraumatic.     Right Ear: External ear normal.     Left Ear: External ear normal.     Nose: No congestion.     Mouth/Throat:     Mouth: Mucous membranes are moist.     Pharynx: Oropharynx is clear.  Eyes:     Conjunctiva/sclera: Conjunctivae normal.     Pupils: Pupils are equal, round, and reactive to light.  Neck:     Trachea: Phonation normal.  Cardiovascular:     Rate and Rhythm: Normal rate and regular rhythm.     Heart sounds: Normal heart sounds.  Pulmonary:     Effort: Pulmonary effort is normal.     Breath sounds: Normal breath sounds.  Abdominal:     General: There is no distension.     Palpations: Abdomen is soft.     Tenderness: There is no abdominal tenderness.  Musculoskeletal:     Cervical back: Normal range of motion and neck supple.     Comments: His neck is held in an antilordotic position and he grimaces with neck flexion or neck extension.  The neck is nonspecifically tender to light palpation  Skin:    General: Skin is warm and dry.  Neurological:     Mental Status: He is alert.     Cranial Nerves: No cranial nerve deficit.     Motor: No abnormal muscle tone.     Coordination: Coordination normal.     Comments: No dysarthria or aphasia.  No ataxia.  Follows commands accurately.  He is confused  Psychiatric:         Mood and Affect: Mood normal.        Behavior: Behavior normal.    ED Results / Procedures / Treatments   Labs (all labs ordered are listed, but only abnormal results are displayed) Labs Reviewed  BASIC METABOLIC PANEL - Abnormal; Notable for the following components:      Result Value   Glucose, Bld 101 (*)    All other components  within normal limits  CBC WITH DIFFERENTIAL/PLATELET    EKG None  Radiology CT Head Wo Contrast  Result Date: 08/12/2021 CLINICAL DATA:  Headache, new or worsening. EXAM: CT HEAD WITHOUT CONTRAST TECHNIQUE: Contiguous axial images were obtained from the base of the skull through the vertex without intravenous contrast. RADIATION DOSE REDUCTION: This exam was performed according to the departmental dose-optimization program which includes automated exposure control, adjustment of the mA and/or kV according to patient size and/or use of iterative reconstruction technique. COMPARISON:  CT examination dated August 06, 2021 FINDINGS: Brain: No evidence of acute infarction, hemorrhage, hydrocephalus, extra-axial collection or mass lesion/mass effect. Prominence of the ventricles and sulci secondary to moderate cerebral volume loss, unchanged. Diffuse low-attenuation of the periventricular white matter presumed chronic microvascular ischemic changes. Vascular: No hyperdense vessel or unexpected calcification. Skull: Normal. Negative for fracture or focal lesion. Sinuses/Orbits: No acute finding.  Bilateral cataract surgery. Other: None. IMPRESSION: 1.  No acute intracranial abnormality. 2. Advanced cerebral atrophy and microvascular ischemic changes of the white matter, unchanged. Electronically Signed   By: Keane Police D.O.   On: 08/12/2021 20:13   CT Cervical Spine Wo Contrast  Result Date: 08/12/2021 CLINICAL DATA:  Neck pain. EXAM: CT CERVICAL SPINE WITHOUT CONTRAST TECHNIQUE: Multidetector CT imaging of the cervical spine was performed without intravenous  contrast. Multiplanar CT image reconstructions were also generated. RADIATION DOSE REDUCTION: This exam was performed according to the departmental dose-optimization program which includes automated exposure control, adjustment of the mA and/or kV according to patient size and/or use of iterative reconstruction technique. COMPARISON:  Cervical spine CT dated 06/26/2021. FINDINGS: Alignment: No acute subluxation. There is straightening of normal cervical lordosis which may be positional or due to muscle spasm. Skull base and vertebrae: No acute fracture.  Osteopenia. Soft tissues and spinal canal: No prevertebral fluid or swelling. No visible canal hematoma. Disc levels: Multilevel degenerative changes with disc space narrowing and endplate irregularity and spurring. Multilevel facet arthropathy. Upper chest: Negative. Other: Bilateral carotid bulb calcified plaques. Partially visualized line in the left upper chest. IMPRESSION: 1. No acute fracture or subluxation of the cervical spine. 2. Multilevel degenerative changes. Electronically Signed   By: Anner Crete M.D.   On: 08/12/2021 23:25    Procedures Procedures    Medications Ordered in ED Medications  ketorolac (TORADOL) 30 MG/ML injection 15 mg (has no administration in time range)  ketorolac (TORADOL) 30 MG/ML injection 15 mg (15 mg Intramuscular Given 08/12/21 2019)  traMADol-acetaminophen (ULTRACET) 37.5-325 MG per tablet 1 tablet (1 tablet Oral Given 08/12/21 2108)    ED Course/ Medical Decision Making/ A&P                           Medical Decision Making Patient with ongoing headache, preceded by neck pain, about a month ago.  Today he had a CT of the head was sent here apparently because he had hydrocephalus.  He has chronic hydrocephalus on prior head CT's.  He had a small brain bleed of the cerebellum, in October 2022.  Amount and/or Complexity of Data Reviewed Independent Historian: caregiver    Details: Wife is historian.  She  is at the bedside External Data Reviewed: radiology and notes.    Details: Prior imaging done today, head CT showing hydrocephalus without other acute changes.  Prior hospitalizations, complicated over the last several months including COVID infection, encephalopathy, diarrhea, C. difficile. Labs: ordered.    Details: CBC, metabolic panel,  normal Radiology: ordered.    Details: Imaging ordered to evaluate for intracranial injuries, intracranial bleeding or spine fracture.  CT cervical spine, head CT-no intracranial abnormalities.  C-spine with generalized degenerative joint disease including facet arthropathy, likely contributing to his pain.  Risk Prescription drug management. Decision regarding hospitalization. Risk Details: Patient with ongoing headache for 1 month, presenting now for worsening condition, improved after treatment in the ED with anti-inflammatory medication.  No evidence for spinal myelopathy.  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  Doubt intracranial bleeding or injury.  Stable for discharge with outpatient management.  Prescription written for prednisone.  Anticipate follow-up at [facility] for ongoing management.           Final Clinical Impression(s) / ED Diagnoses Final diagnoses:  Cervicogenic headache  Osteoarthritis of cervical spine, unspecified spinal osteoarthritis complication status    Rx / DC Orders ED Discharge Orders          Ordered    predniSONE (DELTASONE) 10 MG tablet        08/12/21 2342              Daleen Bo, MD 08/12/21 [phone]

## 2021-08-12 NOTE — ED Provider Triage Note (Signed)
Emergency Medicine Provider Triage Evaluation Note ? ?Dustin Hamilton , a 81 y.o. male  was evaluated in triage.  Pt complains of headache for several weeks. No other associated neuro complaints except for difficulty with ambulation for several months. ? ?Seen at [facility] and had head CT that showed possible hydrocephalous.  ? ?Review of Systems  ?Positive: headache ?Negative: Numbness/weakness ? ?Physical Exam  ?BP (!) 168/100 (BP Location: Left Arm)   Pulse 61   Temp 98.1 ?F (36.7 ?C) (Oral)   Resp 16   SpO2 99%  ?Gen:   Awake, no distress   ?Resp:  Normal effort  ?MSK:   Moves extremities without difficulty  ?Other:  Clear speech, no facial droop, 5/5 strength to the bue/ble ? ?Medical Decision Making  ?Medically screening exam initiated at 4:53 PM.  Appropriate orders placed.  Dustin Hamilton was informed that the remainder of the evaluation will be completed by another provider, this initial triage assessment does not replace that evaluation, and the importance of remaining in the ED until their evaluation is complete. ? ? ?  ?Rodney Booze, PA-C ?08/12/21 1654 ? ?

## 2021-08-19 DIAGNOSIS — G309 Alzheimer's disease, unspecified: Secondary | ICD-10-CM | POA: Diagnosis not present

## 2021-08-19 DIAGNOSIS — I680 Cerebral amyloid angiopathy: Secondary | ICD-10-CM | POA: Diagnosis not present

## 2021-08-19 DIAGNOSIS — R569 Unspecified convulsions: Secondary | ICD-10-CM | POA: Diagnosis not present

## 2021-08-19 DIAGNOSIS — F0280 Dementia in other diseases classified elsewhere without behavioral disturbance: Secondary | ICD-10-CM | POA: Diagnosis not present

## 2021-08-19 DIAGNOSIS — E854 Organ-limited amyloidosis: Secondary | ICD-10-CM | POA: Diagnosis not present

## 2021-08-25 ENCOUNTER — Emergency Department (HOSPITAL_COMMUNITY): Payer: No Typology Code available for payment source

## 2021-08-25 ENCOUNTER — Other Ambulatory Visit: Payer: Self-pay

## 2021-08-25 ENCOUNTER — Emergency Department (HOSPITAL_COMMUNITY)
Admission: EM | Admit: 2021-08-25 | Discharge: 2021-08-25 | Disposition: A | Discharge status: Home / Self Care | Payer: No Typology Code available for payment source | Attending: Emergency Medicine | Admitting: Emergency Medicine

## 2021-08-25 ENCOUNTER — Encounter (HOSPITAL_COMMUNITY): Payer: Self-pay | Admitting: Emergency Medicine

## 2021-08-25 DIAGNOSIS — I10 Essential (primary) hypertension: Secondary | ICD-10-CM | POA: Insufficient documentation

## 2021-08-25 DIAGNOSIS — R531 Weakness: Secondary | ICD-10-CM | POA: Insufficient documentation

## 2021-08-25 DIAGNOSIS — N400 Enlarged prostate without lower urinary tract symptoms: Secondary | ICD-10-CM | POA: Diagnosis not present

## 2021-08-25 DIAGNOSIS — M7989 Other specified soft tissue disorders: Secondary | ICD-10-CM | POA: Insufficient documentation

## 2021-08-25 DIAGNOSIS — R5383 Other fatigue: Secondary | ICD-10-CM | POA: Diagnosis not present

## 2021-08-25 DIAGNOSIS — I700 Atherosclerosis of aorta: Secondary | ICD-10-CM | POA: Diagnosis not present

## 2021-08-25 DIAGNOSIS — M25561 Pain in right knee: Secondary | ICD-10-CM | POA: Diagnosis present

## 2021-08-25 DIAGNOSIS — N2889 Other specified disorders of kidney and ureter: Secondary | ICD-10-CM | POA: Diagnosis not present

## 2021-08-25 DIAGNOSIS — R197 Diarrhea, unspecified: Secondary | ICD-10-CM | POA: Insufficient documentation

## 2021-08-25 DIAGNOSIS — Z79899 Other long term (current) drug therapy: Secondary | ICD-10-CM | POA: Insufficient documentation

## 2021-08-25 LAB — URINALYSIS, ROUTINE W REFLEX MICROSCOPIC
Bilirubin Urine: NEGATIVE
Glucose, UA: NEGATIVE mg/dL
Ketones, ur: NEGATIVE mg/dL
Nitrite: NEGATIVE
Protein, ur: NEGATIVE mg/dL
Specific Gravity, Urine: 1.024 (ref 1.005–1.030)
WBC, UA: >50 WBC/hpf — ABNORMAL HIGH (ref 0–5)
pH: 7 (ref 5.0–8.0)

## 2021-08-25 LAB — CBC
HCT: 47.3 % (ref 39.0–52.0)
Hemoglobin: 15.3 g/dL (ref 13.0–17.0)
MCH: 30.1 pg (ref 26.0–34.0)
MCHC: 32.3 g/dL (ref 30.0–36.0)
MCV: 92.9 fL (ref 80.0–100.0)
Platelets: 155 10*3/uL (ref 150–400)
RBC: 5.09 MIL/uL (ref 4.22–5.81)
RDW: 16.1 % — ABNORMAL HIGH (ref 11.5–15.5)
WBC: 15.7 10*3/uL — ABNORMAL HIGH (ref 4.0–10.5)
nRBC: 0 % (ref 0.0–0.2)

## 2021-08-25 LAB — COMPREHENSIVE METABOLIC PANEL
ALT: 16 U/L (ref 0–44)
AST: 17 U/L (ref 15–41)
Albumin: 3.4 g/dL — ABNORMAL LOW (ref 3.5–5.0)
Alkaline Phosphatase: 60 U/L (ref 38–126)
Anion gap: 9 (ref 5–15)
BUN: 10 mg/dL (ref 8–23)
CO2: 24 mmol/L (ref 22–32)
Calcium: 9.5 mg/dL (ref 8.9–10.3)
Chloride: 106 mmol/L (ref 98–111)
Creatinine, Ser: 0.93 mg/dL (ref 0.61–1.24)
GFR, Estimated: >60 mL/min (ref >60)
Glucose, Bld: 90 mg/dL (ref 70–99)
Potassium: 3.9 mmol/L (ref 3.5–5.1)
Sodium: 139 mmol/L (ref 135–145)
Total Bilirubin: 1.6 mg/dL — ABNORMAL HIGH (ref 0.3–1.2)
Total Protein: 7 g/dL (ref 6.5–8.1)

## 2021-08-25 LAB — LIPASE, BLOOD: Lipase: 23 U/L (ref 11–51)

## 2021-08-25 MED ORDER — IOHEXOL 300 MG/ML  SOLN
100.0000 mL | Freq: Once | INTRAMUSCULAR | Status: AC | PRN
  Administered 2021-08-25: 100 mL via INTRAVENOUS

## 2021-08-25 MED ORDER — COLCHICINE 0.6 MG PO TABS: 0.6000 mg | ORAL_TABLET | Freq: Every day | ORAL | 0 refills | Status: DC

## 2021-08-25 MED ORDER — COLCHICINE 0.6 MG PO TABS
1.2000 mg | ORAL_TABLET | Freq: Once | ORAL | Status: AC
  Administered 2021-08-25: 1.2 mg via ORAL
  Filled 2021-08-25: qty 2

## 2021-08-25 NOTE — Discharge Instructions (Signed)
You have been seen and discharged from the emergency department.  Take your allopurinol as prescribed.  Take new prescription of colchicine as directed.  Your blood work was otherwise normal and CAT scan showed no complications from C. difficile infection.  Stay well-hydrated.  Follow-up with your primary provider for further evaluation and further care. Take home medications as prescribed. If you have any worsening symptoms or further concerns for your health please return to an emergency department for further evaluation. ?

## 2021-08-25 NOTE — ED Notes (Signed)
Pt verbalized understanding of d/c instructions, meds, and followup care. Denies questions. VSS, no distress noted. Steady gait to exit with all belongings.  ?

## 2021-08-25 NOTE — ED Notes (Signed)
Pt provided with urinal to provide sample when able.  ?

## 2021-08-25 NOTE — ED Triage Notes (Addendum)
Pt BIB EMS from home for diarrhea since Thursday dx c-diff one month ago. Recent abx completed one week ago. Increased weakness usually up with walker, unable to stand today. Denies N/V/Fever. PO intake adequate until today. Denies abd pain.  ?

## 2021-08-25 NOTE — ED Notes (Signed)
Pt found to have wet brief, brief changed and condom catheter placed.  ?

## 2021-08-25 NOTE — ED Provider Notes (Signed)
?[facility] ?Provider Note ? ? ?CSN: [id number] ?Arrival date & time: 08/25/21  1318 ? ?  ? ?History ? ?Chief Complaint  ?Patient presents with  ? Diarrhea  ? ? ?Dustin Hamilton is a 81 y.o. male. ? ?HPI ? ?81 year old male with past medical history of HTN, HLD, CVA, chronic Foley, gout presents the emergency department multiple complaints.  Patient recently had an episode of C. difficile infection.  Completed oral antibiotics a couple weeks ago with resolution of symptoms.  Over the last day the patient has experienced intermittent diarrhea, fatigue and right knee pain.  Patient states that he has had gout before, specifically in the right ankle and knee before.  He takes allopurinol in regards to this.  States he feels generally fatigued.  The diarrhea has been nonbloody.  No fever at home.  No injury to the knee.  No calf swelling or symptoms of DVT.  He is accompanied by his son who is at bedside. ? ?Home Medications ?Prior to Admission medications   ?Medication Sig Start Date End Date Taking? Authorizing Provider  ?acetaminophen (TYLENOL) 500 MG tablet Take 1,000 mg by mouth every 6 (six) hours as needed for fever or mild pain.    [provider]  ?allopurinol (ZYLOPRIM) 100 MG tablet Take 1 tablet (100 mg total) by mouth daily. 04/05/21 04/05/22  Pokhrel, Corrie Mckusick, MD  ?atorvastatin (LIPITOR) 40 MG tablet Take 0.5 tablets (20 mg total) by mouth daily. ?Patient not taking: Reported on 07/27/2021 04/05/21   Flora Lipps, MD  ?brimonidine (ALPHAGAN) 0.2 % ophthalmic solution Place 1 drop into both eyes 2 (two) times daily.    [provider]  ?diclofenac Sodium (VOLTAREN) 1 % GEL Apply 4 g topically 4 (four) times daily. ?Patient taking differently: Apply 4 g topically 4 (four) times daily as needed (pain). 06/05/21   Deno Etienne, DO  ?dutasteride (AVODART) 0.5 MG capsule Take 0.5 mg by mouth daily.    [provider]  ?fidaxomicin (DIFICID) 200 MG  TABS tablet Take 1 tablet (200 mg total) by mouth 2 (two) times daily. 07/29/21   Kommor, Madison, MD  ?latanoprost (XALATAN) 0.005 % ophthalmic solution Place 1 drop into both eyes at bedtime.     [provider]  ?lidocaine (LIDODERM) 5 % Place 1 patch onto the skin daily as needed (back pain). Remove & Discard patch within 12 hours or as directed by MD    [provider]  ?memantine (NAMENDA) 10 MG tablet Take 20 mg by mouth at bedtime.    [provider]  ?methocarbamol (ROBAXIN) 500 MG tablet Take 1 tablet (500 mg total) by mouth 2 (two) times daily. ?Patient taking differently: Take 500 mg by mouth 2 (two) times daily as needed for muscle spasms. 06/05/21   Deno Etienne, DO  ?metoprolol succinate (TOPROL-XL) 25 MG 24 hr tablet Take 0.5 tablets (12.5 mg total) by mouth at bedtime. 05/31/20   Rise Patience, DO  ?Naphazoline HCl (CLEAR EYES OP) Apply 2 drops to eye daily as needed (dry eyes).    [provider]  ?Nutritional Supplements ([place] PO) Take 1 Bottle by mouth daily as needed (appetite, nutrition).    [provider]  ?predniSONE (DELTASONE) 10 MG tablet Take q day 6,5,4,3,2,1 08/12/21   Daleen Bo, MD  ?tamsulosin (FLOMAX) 0.4 MG CAPS capsule Take 0.4 mg by mouth at bedtime.    [provider]  ?tiZANidine (ZANAFLEX) 4 MG tablet Take 4 mg  by mouth 2 (two) times daily as needed for muscle spasms. 06/11/21   [provider]  ?   ? ?Allergies    ?Aricept [donepezil], Levetiracetam, Penicillins, and Viagra [sildenafil]   ? ?Review of Systems   ?Review of Systems  ?Constitutional:  Positive for fatigue. Negative for fever.  ?Respiratory:  Negative for shortness of breath.   ?Cardiovascular:  Negative for chest pain.  ?Gastrointestinal:  Positive for diarrhea. Negative for abdominal pain, blood in stool and vomiting.  ?Musculoskeletal:  Negative for back pain.  ?     + right knee pain and swelling  ?Skin:  Negative for rash.   ?Neurological:  Negative for headaches.  ? ?Physical Exam ?Updated Vital Signs ?BP 134/82   Pulse 84   Temp 99.2 ?F (37.3 ?C) (Oral)   Resp (!) 25   Ht '5\' 11"'$  (1.803 m)   Wt 68.1 kg   SpO2 98%   BMI 20.94 kg/m?  ?Physical Exam ?Vitals and nursing note reviewed.  ?Constitutional:   ?   Appearance: Normal appearance.  ?HENT:  ?   Head: Normocephalic.  ?   Mouth/Throat:  ?   Mouth: Mucous membranes are moist.  ?Cardiovascular:  ?   Rate and Rhythm: Normal rate.  ?Pulmonary:  ?   Effort: Pulmonary effort is normal. No respiratory distress.  ?Abdominal:  ?   Palpations: Abdomen is soft.  ?   Tenderness: There is no abdominal tenderness. There is no guarding or rebound.  ?Musculoskeletal:  ?   Comments: Right knee is swollen, TTP, no erythema or warmth, no calf findings suspicious of DVT  ?Skin: ?   General: Skin is warm.  ?Neurological:  ?   Mental Status: He is alert and oriented to person, place, and time. Mental status is at baseline.  ?Psychiatric:     ?   Mood and Affect: Mood normal.  ? ? ?ED Results / Procedures / Treatments   ?Labs ?(all labs ordered are listed, but only abnormal results are displayed) ?Labs Reviewed  ?COMPREHENSIVE METABOLIC PANEL - Abnormal; Notable for the following components:  ?    Result Value  ? Albumin 3.4 (*)   ? Total Bilirubin 1.6 (*)   ? All other components within normal limits  ?CBC - Abnormal; Notable for the following components:  ? WBC 15.7 (*)   ? RDW 16.1 (*)   ? All other components within normal limits  ?LIPASE, BLOOD  ?URINALYSIS, ROUTINE W REFLEX MICROSCOPIC  ? ? ?EKG ?None ? ?Radiology ?CT Abdomen Pelvis W Contrast ? ?Result Date: 08/25/2021 ?CLINICAL DATA:  Pt BIB EMS from home for diarrhea since Thursday dx c-diff one month ago. Recent abx completed one week ago. Increased weakness usually up with walker, unable to stand today. Denies N/V/Fever. PO intake adequate until today. Denies abd pain. EXAM: CT ABDOMEN AND PELVIS WITH CONTRAST TECHNIQUE: Multidetector CT  imaging of the abdomen and pelvis was performed using the standard protocol following bolus administration of intravenous contrast. RADIATION DOSE REDUCTION: This exam was performed according to the departmental dose-optimization program which includes automated exposure control, adjustment of the mA and/or kV according to patient size and/or use of iterative reconstruction technique. CONTRAST:  141m OMNIPAQUE IOHEXOL 300 MG/ML  SOLN COMPARISON:  CTA chest, abdomen and pelvis, 06/26/2021. FINDINGS: Lower chest: Opacity at the base of the right lower lobe, unchanged, presumably chronic atelectasis. Small pericardial effusion. No acute finding. Hepatobiliary: Liver normal size. No mass or focal lesion. Normal gallbladder. No bile duct dilation.  Pancreas: Unremarkable. No pancreatic ductal dilatation or surrounding inflammatory changes. Spleen: Normal in size without focal abnormality. Adrenals/Urinary Tract: No adrenal masses. Kidneys normal size, orientation and position. 1.3 cm lower pole right renal cortical lesion, without convincing enhancement, higher in attenuation than a simple cyst, both similar to the prior CT, likely mildly complicated cyst. Similar appearing, similar sized lesion from the anterior midpole of the left kidney. There additional lower attenuation left renal masses, largest from the lower pole, 5.3 cm, consistent with cysts and stable. No stones. No hydronephrosis. Normal ureters. Bladder mildly distended. Bladder indented by a markedly enlarged prostate similar to the prior CT. No bladder mass. Stomach/Bowel: Normal stomach. Small bowel normal in caliber. No wall thickening or inflammation. Portions of the colon are decompressed, others mildly distended. No colonic wall thickening or convincing inflammation. Normal appendix visualized. Vascular/Lymphatic: Mild aortic atherosclerosis. No aneurysm. No enlarged lymph nodes. Reproductive: Markedly enlarged prostate measuring 9.1 cm, superior to  inferior by 6.7 x 6.2 cm transversely. No change from the prior exam. Other: No abdominal wall hernia or abnormality. No abdominopelvic ascites. Musculoskeletal: No fracture or acute finding.  No bone lesion. IMPRE

## 2021-08-27 ENCOUNTER — Ambulatory Visit (INDEPENDENT_AMBULATORY_CARE_PROVIDER_SITE_OTHER): Payer: Medicare PPO

## 2021-08-27 DIAGNOSIS — I442 Atrioventricular block, complete: Secondary | ICD-10-CM | POA: Diagnosis not present

## 2021-08-27 LAB — CUP PACEART REMOTE DEVICE CHECK
Date Time Interrogation Session: 20230328090512
Implantable Lead Implant Date: 20211227
Implantable Lead Implant Date: 20211227
Implantable Lead Location: 753859
Implantable Lead Location: 753860
Implantable Lead Model: 377
Implantable Lead Model: 377
Implantable Lead Serial Number: 8000018960
Implantable Lead Serial Number: 8000042643
Implantable Pulse Generator Implant Date: 20211227
Pulse Gen Model: 407145
Pulse Gen Serial Number: 69915927

## 2021-09-04 ENCOUNTER — Observation Stay (HOSPITAL_BASED_OUTPATIENT_CLINIC_OR_DEPARTMENT_OTHER): Payer: No Typology Code available for payment source

## 2021-09-04 ENCOUNTER — Emergency Department (HOSPITAL_COMMUNITY): Payer: No Typology Code available for payment source

## 2021-09-04 ENCOUNTER — Observation Stay (HOSPITAL_COMMUNITY)
Admission: EM | Admit: 2021-09-04 | Discharge: 2021-09-06 | Disposition: A | Discharge status: Home Health | Payer: No Typology Code available for payment source | Attending: Internal Medicine | Admitting: Internal Medicine

## 2021-09-04 DIAGNOSIS — G459 Transient cerebral ischemic attack, unspecified: Principal | ICD-10-CM | POA: Insufficient documentation

## 2021-09-04 DIAGNOSIS — R4701 Aphasia: Secondary | ICD-10-CM | POA: Diagnosis not present

## 2021-09-04 DIAGNOSIS — I6782 Cerebral ischemia: Secondary | ICD-10-CM | POA: Diagnosis not present

## 2021-09-04 DIAGNOSIS — Z20822 Contact with and (suspected) exposure to covid-19: Secondary | ICD-10-CM | POA: Diagnosis not present

## 2021-09-04 DIAGNOSIS — Z950 Presence of cardiac pacemaker: Secondary | ICD-10-CM | POA: Diagnosis not present

## 2021-09-04 DIAGNOSIS — Z8673 Personal history of transient ischemic attack (TIA), and cerebral infarction without residual deficits: Secondary | ICD-10-CM | POA: Diagnosis not present

## 2021-09-04 DIAGNOSIS — D696 Thrombocytopenia, unspecified: Secondary | ICD-10-CM | POA: Insufficient documentation

## 2021-09-04 DIAGNOSIS — R4702 Dysphasia: Secondary | ICD-10-CM | POA: Diagnosis not present

## 2021-09-04 DIAGNOSIS — R4182 Altered mental status, unspecified: Secondary | ICD-10-CM | POA: Diagnosis not present

## 2021-09-04 DIAGNOSIS — Z87891 Personal history of nicotine dependence: Secondary | ICD-10-CM | POA: Insufficient documentation

## 2021-09-04 DIAGNOSIS — Z79899 Other long term (current) drug therapy: Secondary | ICD-10-CM | POA: Insufficient documentation

## 2021-09-04 DIAGNOSIS — M542 Cervicalgia: Secondary | ICD-10-CM | POA: Diagnosis not present

## 2021-09-04 DIAGNOSIS — I119 Hypertensive heart disease without heart failure: Secondary | ICD-10-CM | POA: Diagnosis not present

## 2021-09-04 DIAGNOSIS — F0390 Unspecified dementia without behavioral disturbance: Secondary | ICD-10-CM | POA: Insufficient documentation

## 2021-09-04 DIAGNOSIS — G319 Degenerative disease of nervous system, unspecified: Secondary | ICD-10-CM | POA: Diagnosis not present

## 2021-09-04 DIAGNOSIS — R29818 Other symptoms and signs involving the nervous system: Secondary | ICD-10-CM | POA: Diagnosis not present

## 2021-09-04 DIAGNOSIS — R1310 Dysphagia, unspecified: Secondary | ICD-10-CM | POA: Diagnosis not present

## 2021-09-04 LAB — COMPREHENSIVE METABOLIC PANEL
ALT: 13 U/L (ref 0–44)
AST: 13 U/L — ABNORMAL LOW (ref 15–41)
Albumin: 3.3 g/dL — ABNORMAL LOW (ref 3.5–5.0)
Alkaline Phosphatase: 58 U/L (ref 38–126)
Anion gap: 6 (ref 5–15)
BUN: 20 mg/dL (ref 8–23)
CO2: 27 mmol/L (ref 22–32)
Calcium: 9.9 mg/dL (ref 8.9–10.3)
Chloride: 107 mmol/L (ref 98–111)
Creatinine, Ser: 1.34 mg/dL — ABNORMAL HIGH (ref 0.61–1.24)
GFR, Estimated: 54 mL/min — ABNORMAL LOW (ref >60)
Glucose, Bld: 132 mg/dL — ABNORMAL HIGH (ref 70–99)
Potassium: 4.2 mmol/L (ref 3.5–5.1)
Sodium: 140 mmol/L (ref 135–145)
Total Bilirubin: 0.5 mg/dL (ref 0.3–1.2)
Total Protein: 6.8 g/dL (ref 6.5–8.1)

## 2021-09-04 LAB — DIFFERENTIAL
Abs Immature Granulocytes: 0.01 10*3/uL (ref 0.00–0.07)
Basophils Absolute: 0 10*3/uL (ref 0.0–0.1)
Basophils Relative: 0 %
Eosinophils Absolute: 0.1 10*3/uL (ref 0.0–0.5)
Eosinophils Relative: 2 %
Immature Granulocytes: 0 %
Lymphocytes Relative: 25 %
Lymphs Abs: 1.2 10*3/uL (ref 0.7–4.0)
Monocytes Absolute: 0.3 10*3/uL (ref 0.1–1.0)
Monocytes Relative: 5 %
Neutro Abs: 3.4 10*3/uL (ref 1.7–7.7)
Neutrophils Relative %: 68 %

## 2021-09-04 LAB — URINALYSIS, ROUTINE W REFLEX MICROSCOPIC
Bilirubin Urine: NEGATIVE
Glucose, UA: NEGATIVE mg/dL
Hgb urine dipstick: NEGATIVE
Ketones, ur: NEGATIVE mg/dL
Nitrite: NEGATIVE
Protein, ur: 30 mg/dL — AB
Specific Gravity, Urine: 1.02 (ref 1.005–1.030)
WBC, UA: >50 WBC/hpf — ABNORMAL HIGH (ref 0–5)
pH: 5 (ref 5.0–8.0)

## 2021-09-04 LAB — RESP PANEL BY RT-PCR (FLU A&B, COVID) ARPGX2
Influenza A by PCR: NEGATIVE
Influenza B by PCR: NEGATIVE
SARS Coronavirus 2 by RT PCR: NEGATIVE

## 2021-09-04 LAB — CBC
HCT: 45.8 % (ref 39.0–52.0)
Hemoglobin: 14.8 g/dL (ref 13.0–17.0)
MCH: 30.6 pg (ref 26.0–34.0)
MCHC: 32.3 g/dL (ref 30.0–36.0)
MCV: 94.6 fL (ref 80.0–100.0)
Platelets: 134 10*3/uL — ABNORMAL LOW (ref 150–400)
RBC: 4.84 MIL/uL (ref 4.22–5.81)
RDW: 16.2 % — ABNORMAL HIGH (ref 11.5–15.5)
WBC: 5 10*3/uL (ref 4.0–10.5)
nRBC: 0 % (ref 0.0–0.2)

## 2021-09-04 LAB — ECHOCARDIOGRAM COMPLETE
AR max vel: 2.33 cm2
AV Area VTI: 2.65 cm2
AV Area mean vel: 2.55 cm2
AV Mean grad: 1 mmHg
AV Peak grad: 2.4 mmHg
Ao pk vel: 0.78 m/s
Area-P 1/2: 5.75 cm2
S' Lateral: 3 cm

## 2021-09-04 LAB — RAPID URINE DRUG SCREEN, HOSP PERFORMED
Amphetamines: NOT DETECTED
Barbiturates: NOT DETECTED
Benzodiazepines: NOT DETECTED
Cocaine: NOT DETECTED
Opiates: NOT DETECTED
Tetrahydrocannabinol: NOT DETECTED

## 2021-09-04 LAB — ETHANOL: Alcohol, Ethyl (B): <10 mg/dL (ref <10)

## 2021-09-04 LAB — PROTIME-INR
INR: 1.2 (ref 0.8–1.2)
Prothrombin Time: 15.3 seconds — ABNORMAL HIGH (ref 11.4–15.2)

## 2021-09-04 MED ORDER — DUTASTERIDE 0.5 MG PO CAPS
0.5000 mg | ORAL_CAPSULE | Freq: Every day | ORAL | Status: DC
  Administered 2021-09-05 – 2021-09-06 (×2): 0.5 mg via ORAL
  Filled 2021-09-04 (×2): qty 1

## 2021-09-04 MED ORDER — TIZANIDINE HCL 4 MG PO TABS: 4.0000 mg | ORAL_TABLET | Freq: Two times a day (BID) | ORAL | Status: DC | PRN

## 2021-09-04 MED ORDER — SODIUM CHLORIDE 0.9 % IV SOLN
Freq: Once | INTRAVENOUS | Status: AC
Start: 2021-09-04 — End: 2021-09-04

## 2021-09-04 MED ORDER — TAMSULOSIN HCL 0.4 MG PO CAPS
0.4000 mg | ORAL_CAPSULE | Freq: Every day | ORAL | Status: DC
  Administered 2021-09-04 – 2021-09-05 (×2): 0.4 mg via ORAL
  Filled 2021-09-04 (×2): qty 1

## 2021-09-04 MED ORDER — PERFLUTREN LIPID MICROSPHERE
1.0000 mL | INTRAVENOUS | Status: AC | PRN
  Administered 2021-09-04: 10 mL via INTRAVENOUS
  Filled 2021-09-04: qty 10

## 2021-09-04 MED ORDER — ACETAMINOPHEN 160 MG/5ML PO SOLN
650.0000 mg | ORAL | Status: DC | PRN
Start: 2021-09-04 — End: 2021-09-06

## 2021-09-04 MED ORDER — BRIMONIDINE TARTRATE 0.2 % OP SOLN
1.0000 [drp] | Freq: Two times a day (BID) | OPHTHALMIC | Status: DC
  Administered 2021-09-04 – 2021-09-06 (×4): 1 [drp] via OPHTHALMIC
  Filled 2021-09-04: qty 5

## 2021-09-04 MED ORDER — STROKE: EARLY STAGES OF RECOVERY BOOK
Freq: Once | Status: AC
  Filled 2021-09-04: qty 1

## 2021-09-04 MED ORDER — FIDAXOMICIN 200 MG PO TABS
200.0000 mg | ORAL_TABLET | Freq: Two times a day (BID) | ORAL | Status: DC
  Administered 2021-09-04 – 2021-09-06 (×4): 200 mg via ORAL
  Filled 2021-09-04 (×5): qty 1

## 2021-09-04 MED ORDER — FIDAXOMICIN 200 MG PO TABS: 200.0000 mg | ORAL_TABLET | Freq: Two times a day (BID) | ORAL | Status: DC

## 2021-09-04 MED ORDER — METOPROLOL SUCCINATE ER 25 MG PO TB24
12.5000 mg | ORAL_TABLET | Freq: Every day | ORAL | Status: DC
  Administered 2021-09-04 – 2021-09-05 (×2): 12.5 mg via ORAL
  Filled 2021-09-04 (×2): qty 1

## 2021-09-04 MED ORDER — METHOCARBAMOL 500 MG PO TABS: 500.0000 mg | ORAL_TABLET | Freq: Two times a day (BID) | ORAL | Status: DC | PRN

## 2021-09-04 MED ORDER — LATANOPROST 0.005 % OP SOLN
1.0000 [drp] | Freq: Every day | OPHTHALMIC | Status: DC
  Administered 2021-09-04 – 2021-09-05 (×2): 1 [drp] via OPHTHALMIC
  Filled 2021-09-04: qty 2.5

## 2021-09-04 MED ORDER — ASPIRIN 325 MG PO TABS
325.0000 mg | ORAL_TABLET | Freq: Every day | ORAL | Status: DC
  Administered 2021-09-04: 325 mg via ORAL
  Filled 2021-09-04: qty 1

## 2021-09-04 MED ORDER — ENOXAPARIN SODIUM 40 MG/0.4ML IJ SOSY
40.0000 mg | PREFILLED_SYRINGE | INTRAMUSCULAR | Status: DC
  Administered 2021-09-04 – 2021-09-05 (×2): 40 mg via SUBCUTANEOUS
  Filled 2021-09-04 (×2): qty 0.4

## 2021-09-04 MED ORDER — LIDOCAINE 5 % EX PTCH: 1.0000 | MEDICATED_PATCH | Freq: Every day | CUTANEOUS | Status: DC | PRN

## 2021-09-04 MED ORDER — ACETAMINOPHEN 650 MG RE SUPP
650.0000 mg | RECTAL | Status: DC | PRN
Start: 2021-09-04 — End: 2021-09-06

## 2021-09-04 MED ORDER — ALLOPURINOL 100 MG PO TABS
100.0000 mg | ORAL_TABLET | Freq: Every day | ORAL | Status: DC
  Administered 2021-09-05 – 2021-09-06 (×2): 100 mg via ORAL
  Filled 2021-09-04 (×2): qty 1

## 2021-09-04 MED ORDER — ACETAMINOPHEN 500 MG PO TABS: 1000.0000 mg | ORAL_TABLET | Freq: Four times a day (QID) | ORAL | Status: DC | PRN

## 2021-09-04 MED ORDER — ENSURE ENLIVE PO LIQD: Freq: Every day | ORAL | Status: DC | PRN

## 2021-09-04 MED ORDER — FIDAXOMICIN 200 MG PO TABS: 200.0000 mg | ORAL_TABLET | ORAL | Status: DC

## 2021-09-04 MED ORDER — CEPHALEXIN 500 MG PO CAPS
500.0000 mg | ORAL_CAPSULE | Freq: Four times a day (QID) | ORAL | Status: DC
  Administered 2021-09-04 – 2021-09-06 (×8): 500 mg via ORAL
  Filled 2021-09-04 (×8): qty 1

## 2021-09-04 MED ORDER — MEMANTINE HCL 10 MG PO TABS
20.0000 mg | ORAL_TABLET | Freq: Every day | ORAL | Status: DC
  Administered 2021-09-04 – 2021-09-05 (×2): 20 mg via ORAL
  Filled 2021-09-04 (×2): qty 2

## 2021-09-04 MED ORDER — DICLOFENAC SODIUM 1 % EX GEL
4.0000 g | Freq: Four times a day (QID) | CUTANEOUS | Status: DC | PRN
  Filled 2021-09-04: qty 100

## 2021-09-04 MED ORDER — ACETAMINOPHEN 325 MG PO TABS
650.0000 mg | ORAL_TABLET | ORAL | Status: DC | PRN
  Administered 2021-09-05 – 2021-09-06 (×2): 650 mg via ORAL
  Filled 2021-09-04 (×2): qty 2

## 2021-09-04 MED ORDER — SODIUM CHLORIDE 0.9 % IV SOLN: 100.0000 mL/h | INTRAVENOUS | Status: DC

## 2021-09-04 MED ORDER — COLCHICINE 0.6 MG PO TABS: 0.6000 mg | ORAL_TABLET | Freq: Every day | ORAL | Status: DC

## 2021-09-04 MED ORDER — SODIUM CHLORIDE 0.9 % IV BOLUS: 500.0000 mL | Freq: Once | INTRAVENOUS | Status: DC

## 2021-09-04 NOTE — Progress Notes (Signed)
Ok to change fidaxomicin to '200mg'$  BID for 7 more doses then '200mg'$  q48h for 20 days per outpt recurrence regimen. Dr Roosevelt Locks. ? ?Onnie Boer, PharmD, BCIDP, AAHIVP, CPP ?Infectious Disease Pharmacist ?09/04/2021 6:50 PM ? ? ?

## 2021-09-04 NOTE — Progress Notes (Signed)
Informed of MRI for this admission.  ? ?Interrogation to be reviewed by industry representative, and staff may proceed with their recommended settings at that time.  ? ?Tachy-therapies to off if applicable. ? ?Program device back to pre-MRI settings after completion of exam. ? ?Shirley Friar, PA-C  ?09/04/2021 2:16 PM   ?

## 2021-09-04 NOTE — Progress Notes (Signed)
?  Echocardiogram ?2D Echocardiogram has been performed. ? ?Dustin Hamilton ?09/04/2021, 5:22 PM ?

## 2021-09-04 NOTE — ED Provider Notes (Signed)
?[facility] ?Provider Note ? ? ?CSN: [id number] ?Arrival date & time: 09/04/21  1140 ? ?  ? ?History ? ?Chief Complaint  ?Patient presents with  ? Aphasia  ? ? ?Dustin Hamilton is a 81 y.o. male. ? ?Pt is an 81 yo male with a pmhx significant for htn, cva, bph, and high cholesterol.  Pt had an episode this morning around 1050 with garbled speech.  When EMS arrived, pt's sx have returned to baseline.  Pt denies any weakness, no vision problems.  No trouble speaking now. ? ? ?  ? ?Home Medications ?Prior to Admission medications   ?Medication Sig Start Date End Date Taking? Authorizing Provider  ?acetaminophen (TYLENOL) 500 MG tablet Take 1,000 mg by mouth every 6 (six) hours as needed for fever or mild pain.    [provider]  ?allopurinol (ZYLOPRIM) 100 MG tablet Take 1 tablet (100 mg total) by mouth daily. 04/05/21 04/05/22  Pokhrel, Corrie Mckusick, MD  ?atorvastatin (LIPITOR) 40 MG tablet Take 0.5 tablets (20 mg total) by mouth daily. ?Patient not taking: Reported on 07/27/2021 04/05/21   Flora Lipps, MD  ?brimonidine (ALPHAGAN) 0.2 % ophthalmic solution Place 1 drop into both eyes 2 (two) times daily.    [provider]  ?colchicine 0.6 MG tablet Take 1 tablet (0.6 mg total) by mouth daily. 08/25/21   Horton, Alvin Critchley, DO  ?diclofenac Sodium (VOLTAREN) 1 % GEL Apply 4 g topically 4 (four) times daily. ?Patient taking differently: Apply 4 g topically 4 (four) times daily as needed (pain). 06/05/21   Deno Etienne, DO  ?dutasteride (AVODART) 0.5 MG capsule Take 0.5 mg by mouth daily.    [provider]  ?fidaxomicin (DIFICID) 200 MG TABS tablet Take 1 tablet (200 mg total) by mouth 2 (two) times daily. 07/29/21   Kommor, Madison, MD  ?latanoprost (XALATAN) 0.005 % ophthalmic solution Place 1 drop into both eyes at bedtime.     [provider]  ?lidocaine (LIDODERM) 5 % Place 1 patch onto the skin daily as needed (back pain). Remove & Discard patch  within 12 hours or as directed by MD    [provider]  ?memantine (NAMENDA) 10 MG tablet Take 20 mg by mouth at bedtime.    [provider]  ?methocarbamol (ROBAXIN) 500 MG tablet Take 1 tablet (500 mg total) by mouth 2 (two) times daily. ?Patient taking differently: Take 500 mg by mouth 2 (two) times daily as needed for muscle spasms. 06/05/21   Deno Etienne, DO  ?metoprolol succinate (TOPROL-XL) 25 MG 24 hr tablet Take 0.5 tablets (12.5 mg total) by mouth at bedtime. 05/31/20   Rise Patience, DO  ?Naphazoline HCl (CLEAR EYES OP) Apply 2 drops to eye daily as needed (dry eyes).    [provider]  ?Nutritional Supplements ([place] PO) Take 1 Bottle by mouth daily as needed (appetite, nutrition).    [provider]  ?predniSONE (DELTASONE) 10 MG tablet Take q day 6,5,4,3,2,1 08/12/21   Daleen Bo, MD  ?tamsulosin (FLOMAX) 0.4 MG CAPS capsule Take 0.4 mg by mouth at bedtime.    [provider]  ?tiZANidine (ZANAFLEX) 4 MG tablet Take 4 mg by mouth 2 (two) times daily as needed for muscle spasms. 06/11/21   [provider]  ?   ? ?Allergies    ?Aricept [donepezil], Levetiracetam, Penicillins, and Viagra [sildenafil]   ? ?Review of Systems   ?Review of Systems  ?Neurological:  Positive for  speech difficulty.  ?All other systems reviewed and are negative. ? ?Physical Exam ?Updated Vital Signs ?BP 120/77   Pulse 62   Temp 98.9 ?F (37.2 ?C) (Oral)   Resp 18   SpO2 99%  ?Physical Exam ?Vitals and nursing note reviewed.  ?Constitutional:   ?   Appearance: Normal appearance.  ?HENT:  ?   Head: Normocephalic and atraumatic.  ?   Right Ear: External ear normal.  ?   Left Ear: External ear normal.  ?   Nose: Nose normal.  ?   Mouth/Throat:  ?   Mouth: Mucous membranes are moist.  ?   Pharynx: Oropharynx is clear.  ?Eyes:  ?   Extraocular Movements: Extraocular movements intact.  ?   Pupils: Pupils are equal, round, and reactive to light.   ?Cardiovascular:  ?   Rate and Rhythm: Normal rate and regular rhythm.  ?   Pulses: Normal pulses.  ?   Heart sounds: Normal heart sounds.  ?Pulmonary:  ?   Effort: Pulmonary effort is normal.  ?   Breath sounds: Normal breath sounds.  ?Abdominal:  ?   General: Abdomen is flat. Bowel sounds are normal.  ?   Palpations: Abdomen is soft.  ?Musculoskeletal:     ?   General: Normal range of motion.  ?   Cervical back: Normal range of motion and neck supple.  ?Skin: ?   General: Skin is warm.  ?   Capillary Refill: Capillary refill takes less than 2 seconds.  ?Neurological:  ?   General: No focal deficit present.  ?   Mental Status: He is alert and oriented to person, place, and time.  ?Psychiatric:     ?   Mood and Affect: Mood normal.     ?   Behavior: Behavior normal.  ? ? ?ED Results / Procedures / Treatments   ?Labs ?(all labs ordered are listed, but only abnormal results are displayed) ?Labs Reviewed  ?PROTIME-INR - Abnormal; Notable for the following components:  ?    Result Value  ? Prothrombin Time 15.3 (*)   ? All other components within normal limits  ?CBC - Abnormal; Notable for the following components:  ? RDW 16.2 (*)   ? Platelets 134 (*)   ? All other components within normal limits  ?COMPREHENSIVE METABOLIC PANEL - Abnormal; Notable for the following components:  ? Glucose, Bld 132 (*)   ? Creatinine, Ser 1.34 (*)   ? Albumin 3.3 (*)   ? AST 13 (*)   ? GFR, Estimated 54 (*)   ? All other components within normal limits  ?RESP PANEL BY RT-PCR (FLU A&B, COVID) ARPGX2  ?DIFFERENTIAL  ?ETHANOL  ?RAPID URINE DRUG SCREEN, HOSP PERFORMED  ?URINALYSIS, ROUTINE W REFLEX MICROSCOPIC  ?URINALYSIS, ROUTINE W REFLEX MICROSCOPIC  ? ? ?EKG ?EKG Interpretation ? ?Date/Time:  Wednesday September 04 2021 11:52:42 EDT ?Ventricular Rate:  69 ?PR Interval:  288 ?QRS Duration: 143 ?QT Interval:  435 ?QTC Calculation: 466 ?R Axis:   -50 ?Text Interpretation: Sinus rhythm Prolonged PR interval Probable left atrial enlargement  Nonspecific IVCD with LAD No significant change since last tracing Confirmed by Isla Pence [phone]) on 09/04/2021 12:30:45 PM ? ?Radiology ?CT HEAD WO CONTRAST ? ?Result Date: 09/04/2021 ?CLINICAL DATA:  Neuro deficit, acute, stroke suspected. Transient aphasia. EXAM: CT HEAD WITHOUT CONTRAST TECHNIQUE: Contiguous axial images were obtained from the base of the skull through the vertex without intravenous contrast. RADIATION DOSE REDUCTION: This exam was performed according to  the departmental dose-optimization program which includes automated exposure control, adjustment of the mA and/or kV according to patient size and/or use of iterative reconstruction technique. COMPARISON:  Head CT 08/12/2021 and MRI 04/01/2021 FINDINGS: Brain: There is no evidence of an acute cortically based infarct, intracranial hemorrhage, mass, midline shift, or extra-axial fluid collection. Confluent hypodensities in the cerebral white matter bilaterally are unchanged and nonspecific but compatible with severe chronic small vessel ischemic disease. Moderately advanced cerebral atrophy is again noted. Vascular: Calcified atherosclerosis at the skull base. No hyperdense vessel. Skull: No fracture or suspicious osseous lesion. Sinuses/Orbits: Minimal mucosal thickening in the right maxillary sinus. Trace bilateral mastoid effusions. Bilateral cataract extraction. Other: None. IMPRESSION: 1. No evidence of acute intracranial abnormality. 2. Severe chronic small vessel ischemic disease. Electronically Signed   By: Logan Bores M.D.   On: 09/04/2021 13:19  ? ?DG Chest Portable 1 View ? ?Result Date: 09/04/2021 ?CLINICAL DATA:  Altered mental status.  Difficulty speaking. EXAM: PORTABLE CHEST 1 VIEW COMPARISON:  07/27/2021 FINDINGS: Pacer with leads at right atrium and right ventricle. No lead discontinuity. Moderate right hemidiaphragm elevation. Midline trachea. Normal heart size. No pleural effusion or pneumothorax. Volume loss with scarring or  atelectasis in the right lung base, adjacent to the elevated diaphragm. Clear left lung. IMPRESSION: No acute cardiopulmonary disease. Moderate right hemidiaphragm elevation, as before. Electronically Signed   By: Philis Fendt

## 2021-09-04 NOTE — Progress Notes (Signed)
Pt admitted to 3W22 at this time.  Alert and oriented.  Denies any pain or discomfort.  Telemetry placed on patient and CCMD called and second verification completed.  Bed alarm set, call bell within reach, and pt vebalizes understanding to call before getting out of bed.  Wife at beside. ?

## 2021-09-04 NOTE — ED Triage Notes (Signed)
Pt BIB [facility] from home after family called for the patient having trouble speaking. Patient had trouble speaking during breakfast at ~10:50, garbled speech. Upon EMS arrival symptoms resolved to baseline at 11:10am. Family reports no unilateral weakness or other deficits. Currently receiving treatment for UTI, previous hx of stroke, no recent falls reported. ?

## 2021-09-04 NOTE — Evaluation (Signed)
Speech Language Pathology Evaluation ?Patient Details ?Name: Dustin Hamilton ?MRN: [id number] ?DOB: 1941-05-02 ?Today's Date: 09/04/2021 ?Time: 1545-1610 ?SLP Time Calculation (min) (ACUTE ONLY): 25 min ? ?Problem List:  ?Patient Active Problem List  ? Diagnosis Date Noted  ? Sepsis ([place]) 06/26/2021  ? Acute metabolic encephalopathy 50/35/4656  ? 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 Fever and chills 04/01/2021  ? 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 Complete heart block ([place])   ? Heart block   ? Syncope 05/03/2020  ? History of stroke 05/03/2020  ? History of supraventricular tachycardia 05/03/2020  ? Thrombocytopenia ([place]) 05/03/2020  ? AKI (acute kidney injury) ([place]) 05/03/2020  ? Memory impairment 05/03/2020  ? Atypical chest pain 01/27/2020  ? SVT (supraventricular tachycardia) ([place]) 06/03/2017  ? Dizziness 08/02/2014  ? CVA (cerebral infarction) 09/13/2012  ? Vertigo 09/12/2012  ? EAR PAIN 09/05/2009  ? BACK PAIN 06/20/2009  ? COLONIC POLYPS 05/06/2007  ? 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 NONDEPENDENT ALCOHOL ABUSE IN REMISSION 05/06/2007  ? Essential hypertension 05/06/2007  ? Hypertensive heart disease without heart failure 05/06/2007  ? INTRACRANIAL HEMORRHAGE 05/06/2007  ? TIA (transient ischemic attack) 05/06/2007  ? HEMORRHOIDS 05/06/2007  ? INGUINAL HERNIA, RIGHT 05/06/2007  ? RENAL CYST 05/06/2007  ? HEMATURIA UNSPECIFIED 05/06/2007  ? 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 PSA, INCREASED 05/06/2007  ? LIVER FUNCTION TESTS, ABNORMAL 05/06/2007  ? Benign prostatic hyperplasia with urinary obstruction 05/06/2007  ? ?Past Medical History:  ?Past Medical History:  ?Diagnosis Date  ? BPH (benign prostatic hyperplasia)   ? Cerebrovascular disease   ? Clostridium difficile diarrhea   ? Congenital anomaly of diaphragm   ? Elevated PSA   ? Glaucoma, both eyes   ? Hemorrhoid   ? Hepatitis B surface antigen positive   ? 02-20-2011  ? History of adenomatous polyp of colon   ? 2007, 2009 and  2013  tubular adenoma's  ? History of alcohol abuse   ? quit 1963  ? History of cerebral parenchymal hemorrhage   ? 01/ 2006  left occiptial lobe related to hypertensive crisis  ? History of CVA (cerebrovascular accident)   ? 09-12-2012  left hippocampus/ amygdala junction and per MRI old white matter infarcts--  per pt residual short- term memory issues  ? History of fatty infiltration of liver hx visit's at [facility] , last visit 05/ 2014  ? elvated LFT's ,  via liver bx 2004 related to hx alcohol and drug abuse (quit 1964)  ? 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 HTN (hypertension)   ? Renal cyst, left   ? Stroke [facility])   ? hx of 3 strokes in past   ? 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 ?Past Surgical History:  ?Past Surgical History:  ?Procedure Laterality Date  ? CARDIOVASCULAR STRESS TEST  05/05/2007  ? normal nuclear study w/ no ischemia/  normal LV fucntion and wall motion , ef60%  ? COLONOSCOPY  last one 04-06-2012  ? CYSTO/  LEFT RETROGRADE PYELOGRAM/ CYTOLOGY WASHINGS/  URETEROSCOPY  03/05/2000  ? INGUINAL HERNIA REPAIR Bilateral 1965 and 1980's  ? LAPAROSCOPIC INGUINAL HERNIA WITH UMBILICAL HERNIA Right 81/27/5170  ? LIVER BIOPSY  1980's and 2004  ? 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  ? Procedure: PACEMAKER IMPLANT;  Surgeon: Evans Lance, MD;  Location: [facility];  Service: Cardiovascular;  Laterality: N/A;  ? SVT ABLATION N/A 11/15/2018  ? Procedure: SVT ABLATION;  Surgeon: Evans Lance, MD;  Location: [place];  Service: Cardiovascular;  Laterality: N/A;  ? TRANSTHORACIC ECHOCARDIOGRAM  09/13/2012  ? moderate LVH,  ef 60-65%/    ? TRANSURETHRAL RESECTION OF BLADDER TUMOR N/A 08/11/2016  ? Procedure: TRANSURETHRAL RESECTION OF BLADDER TUMOR (TURBT);  Surgeon: Cleon Gustin, MD;  Location: [facility];  Service: Urology;  Laterality: N/A;  ? ?HPI:   ?Patient is an 81 y.o. male with PMH: CVA, recurrent C. difficile, cerebral parenchymal hemorrhage in 2006, dementia. He presented to ED on 4/5 with trouble speaking. Family reported that for approximately 20-30 minutes, they couldnt understand patient's speech and he was having difficulty moving but that symptoms resolved on their own. He was evaluted by SLP in November of 2022 during  an admission at that time and recommendation was for [facility] SLP services for cognition. Per patient's wife, he has been receiving [facility] SLP one time a week.  ? ?Assessment / Plan / Recommendation ?Clinical Impression ? Patient presents with cognitive-linguistic impairments that are likely at or near his baseline from previous CVA and dementia diagnosis. His wife reported that he has been receiving home health SLP services weekly on Mondays. When patient having difficulty expressing himself when SLP asked him why he was in the hospital, his wife indicated that he has been displaying this type of behavior at home more recently as well, where he seems to be stuck and can't communicate effectively. SLP assessed patient's cognition via the SLUMS ([place] Mental Status exam) and he received a score of 19 out of 30 and places him in the category for 'Dementia' (scores 1-20). He was oriented fully to time and place, recalled 4 of 5 words after 2 minute delay and recalled 3/4 delayed recall questions after SLP read aloud a short story. Patient was able to answer open-ended questions about himself, requiring extra processing time. His spouse verified information that patient gave. SLP is recommending to continue [facility] SLP services upon discharge from hospital but not recommending acute care level SLP services. ?   ?SLP Assessment ? SLP Recommendation/Assessment: All further Speech Lanaguage Pathology  needs can be addressed in the next venue of care ?SLP Visit Diagnosis: Cognitive communication deficit (R41.841)  ?  ?Recommendations for  follow up therapy are one component of a multi-disciplinary discharge planning process, led by the attending physician.  Recommendations may be updated based on patient status, additional functional criteria and insurance authorization. ?   ?Follow Up Recommendations ? Home health SLP  ?  ?Assistance Recommended at Discharge ? Frequent or constant Supervision/Assistance  ?Functional Status Assessment Patient has had a recent decline in their functional status and demonstrates the ability to make significant improvements in function in a reasonable and predictable amount of time.  ?Frequency and Duration    ?  ?  ?   ?SLP Evaluation ?Cognition ? Overall Cognitive Status: History of cognitive impairments - at baseline ?Orientation Level: Oriented to person;Oriented to place;Oriented to time;Disoriented to situation ?Year: 2023 ?Month: March ?Day of Week: Correct ?Attention: Sustained ?Sustained Attention: Impaired ?Sustained Attention Impairment: Verbal complex ?Memory: Impaired ?Memory Impairment: Storage deficit;Retrieval deficit ?Awareness: Impaired ?Awareness Impairment: Intellectual impairment ?Problem Solving: Impaired ?Problem Solving Impairment: Verbal complex ?Safety/Judgment: Other (comment) ?Comments: Patient not impulsive or exhibiting any unsafe behaviors but was not able to indicate reason for hospitalization  ?  ?   ?Comprehension ? Auditory Comprehension ?  Overall Auditory Comprehension: Impaired at baseline ?Yes/No Questions: Within Functional Limits ?Commands: Within Functional Limits ?Interfering Components: Processing speed ?EffectiveTechniques: Extra processing time;Repetition ?Visual Recognition/Discrimination ?Discrimination: Not tested ?Reading Comprehension ?Reading Status: Not tested  ?  ?Expression Expression ?Primary Mode of Expression: Verbal ?Verbal Expression ?Overall Verbal Expression: Impaired ?Initiation: No impairment ?Level of Generative/Spontaneous Verbalization:  Sentence;Phrase ?Naming: No impairment ?Pragmatics: No impairment ?Interfering Components: Premorbid deficit;Attention ?Effective Techniques: Open ended questions ?Written Expression ?Dominant Hand: Right ?Written Expression: Not t

## 2021-09-04 NOTE — H&P (Signed)
?History and Physical  ? ? ?Dustin Hamilton FBP:[id number] DOB: 1940-11-09 DOA: 09/04/2021 ? ?PCP: [facility] (Confirm with patient/family/NH records and if not entered, this has to be entered at [facility] point of entry) ?Patient coming from: Home ? ?I have personally briefly reviewed patient's old medical records in [place] ? ?Chief Complaint: I feel fine ? ?HPI: Dustin Hamilton is a 81 y.o. male with medical history significant of stroke, SVT status post ablation and PPM, recurrent C. difficile, BPH, cerebral parenchymal hemorrhage in 2006, dementia, presented with trouble speaking. ? ?Family reported that this afternoon patient had 1 episode of unable to understand family's talking, and himself talking gibberish, and patient was having difficulty to move.  The whole episode lasted about 20-30 minutes and resolved by its own.  Patient however does not remember any details regarding the episode.  He denied any weakness numbness of any of the limbs, no vision problems no speech problems.  Last week, patient was diagnosed with recurrent C. difficile colitis after several episodes of diarrhea and was started on Dificid treatment since last Friday.  On this Monday, patient was started on nitrofurantoin for a putative UTI.  Patient however denied any diarrhea or urinary problems no fever or chills at this point. ? ?ED Course: CT head negative for acute findings. ? ?Review of Systems: As per HPI otherwise 14 point review of systems negative.  ? ? ?Past Medical History:  ?Diagnosis Date  ? BPH (benign prostatic hyperplasia)   ? Cerebrovascular disease   ? Clostridium difficile diarrhea   ? Congenital anomaly of diaphragm   ? Elevated PSA   ? Glaucoma, both eyes   ? Hemorrhoid   ? Hepatitis B surface antigen positive   ? 02-20-2011  ? History of adenomatous polyp of colon   ? 2007, 2009 and 2013  tubular adenoma's  ? History of alcohol abuse   ? quit 1963  ? History of cerebral parenchymal hemorrhage    ? 01/ 2006  left occiptial lobe related to hypertensive crisis  ? History of CVA (cerebrovascular accident)   ? 09-12-2012  left hippocampus/ amygdala junction and per MRI old white matter infarcts--  per pt residual short- term memory issues  ? History of fatty infiltration of liver hx visit's at [facility] , last visit 05/ 2014  ? elvated LFT's ,  via liver bx 2004 related to hx alcohol and drug abuse (quit 1964)  ? 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 HTN (hypertension)   ? Renal cyst, left   ? Stroke [facility])   ? hx of 3 strokes in past   ? 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 ? ?Past Surgical History:  ?Procedure Laterality Date  ? CARDIOVASCULAR STRESS TEST  05/05/2007  ? normal nuclear study w/ no ischemia/  normal LV fucntion and wall motion , ef60%  ? COLONOSCOPY  last one 04-06-2012  ? CYSTO/  LEFT RETROGRADE PYELOGRAM/ CYTOLOGY WASHINGS/  URETEROSCOPY  03/05/2000  ? INGUINAL HERNIA REPAIR Bilateral 1965 and 1980's  ? LAPAROSCOPIC INGUINAL HERNIA WITH UMBILICAL HERNIA Right 82/42/3536  ? LIVER BIOPSY  1980's and 2004  ? 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  ? Procedure: PACEMAKER IMPLANT;  Surgeon: Evans Lance, MD;  Location: [facility];  Service: Cardiovascular;  Laterality: N/A;  ? SVT ABLATION N/A 11/15/2018  ?  Procedure: SVT ABLATION;  Surgeon: Evans Lance, MD;  Location: [facility];  Service: Cardiovascular;  Laterality: N/A;  ? TRANSTHORACIC ECHOCARDIOGRAM  09/13/2012  ? moderate LVH,  ef 60-65%/    ? TRANSURETHRAL RESECTION OF BLADDER TUMOR N/A 08/11/2016  ? Procedure: TRANSURETHRAL RESECTION OF BLADDER TUMOR (TURBT);  Surgeon: Cleon Gustin, MD;  Location: [facility];  Service: Urology;  Laterality: N/A;  ? ? ? reports that he quit smoking about 39 years ago. His smoking use included cigarettes. He has a 5.00 pack-year smoking history. He has never used  smokeless tobacco. He reports that he does not drink alcohol and does not use drugs. ? ?Allergies  ?Allergen Reactions  ? Aricept [Donepezil]   ?  dizziness  ? Levetiracetam Other (See Comments)  ?  AMS  ? Penicillins Hives  ? Viagra [Sildenafil]   ?  dizziness  ? ? ?Family History  ?Problem Relation Age of Onset  ? Rheum arthritis Mother   ? Diabetes Mother   ? Stroke Mother   ? Heart attack Mother   ? Kidney failure Mother   ? Heart attack Father   ? Heart disease Maternal Grandmother   ? Rheum arthritis Maternal Grandmother   ? Diabetes Maternal Grandmother   ? Stroke Maternal Grandmother   ? Colon cancer Neg Hx   ? ? ?Prior to Admission medications   ?Medication Sig Start Date End Date Taking? Authorizing Provider  ?acetaminophen (TYLENOL) 500 MG tablet Take 1,000 mg by mouth every 6 (six) hours as needed for fever or mild pain.    [provider]  ?allopurinol (ZYLOPRIM) 100 MG tablet Take 1 tablet (100 mg total) by mouth daily. 04/05/21 04/05/22  Pokhrel, Corrie Mckusick, MD  ?atorvastatin (LIPITOR) 40 MG tablet Take 0.5 tablets (20 mg total) by mouth daily. ?Patient not taking: Reported on 07/27/2021 04/05/21   Flora Lipps, MD  ?brimonidine (ALPHAGAN) 0.2 % ophthalmic solution Place 1 drop into both eyes 2 (two) times daily.    [provider]  ?colchicine 0.6 MG tablet Take 1 tablet (0.6 mg total) by mouth daily. 08/25/21   Horton, Alvin Critchley, DO  ?diclofenac Sodium (VOLTAREN) 1 % GEL Apply 4 g topically 4 (four) times daily. ?Patient taking differently: Apply 4 g topically 4 (four) times daily as needed (pain). 06/05/21   Deno Etienne, DO  ?dutasteride (AVODART) 0.5 MG capsule Take 0.5 mg by mouth daily.    [provider]  ?fidaxomicin (DIFICID) 200 MG TABS tablet Take 1 tablet (200 mg total) by mouth 2 (two) times daily. 07/29/21   Kommor, [place], MD  ?latanoprost (XALATAN) 0.005 % ophthalmic solution Place 1 drop into both eyes at bedtime.     [provider]  ?lidocaine (LIDODERM) 5  % Place 1 patch onto the skin daily as needed (back pain). Remove & Discard patch within 12 hours or as directed by MD    [provider]  ?memantine (NAMENDA) 10 MG tablet Take 20 mg by mouth at bedtime.    [provider]  ?methocarbamol (ROBAXIN) 500 MG tablet Take 1 tablet (500 mg total) by mouth 2 (two) times daily. ?Patient taking differently: Take 500 mg by mouth 2 (two) times daily as needed for muscle spasms. 06/05/21   Deno Etienne, DO  ?metoprolol succinate (TOPROL-XL) 25 MG 24 hr tablet Take 0.5 tablets (12.5 mg total) by mouth at bedtime. 05/31/20   Rise Patience, DO  ?Naphazoline HCl (CLEAR EYES OP) Apply 2 drops to  eye daily as needed (dry eyes).    [provider]  ?Nutritional Supplements ([place] PO) Take 1 Bottle by mouth daily as needed (appetite, nutrition).    [provider]  ?predniSONE (DELTASONE) 10 MG tablet Take q day 6,5,4,3,2,1 08/12/21   Daleen Bo, MD  ?tamsulosin (FLOMAX) 0.4 MG CAPS capsule Take 0.4 mg by mouth at bedtime.    [provider]  ?tiZANidine (ZANAFLEX) 4 MG tablet Take 4 mg by mouth 2 (two) times daily as needed for muscle spasms. 06/11/21   [provider]  ? ? ?Physical Exam: ?Vitals:  ? 09/04/21 1152 09/04/21 1215 09/04/21 1400 09/04/21 1500  ?BP: 117/73 109/77 120/77 133/79  ?Pulse: 69 70 62 (!) 59  ?Resp: '17 18 18 13  '$ ?Temp: 98.9 ?F (37.2 ?C)     ?TempSrc: Oral     ?SpO2: 97% 98% 99% 99%  ? ? ?Constitutional: NAD, calm, comfortable ?Vitals:  ? 09/04/21 1152 09/04/21 1215 09/04/21 1400 09/04/21 1500  ?BP: 117/73 109/77 120/77 133/79  ?Pulse: 69 70 62 (!) 59  ?Resp: '17 18 18 13  '$ ?Temp: 98.9 ?F (37.2 ?C)     ?TempSrc: Oral     ?SpO2: 97% 98% 99% 99%  ? ?Eyes: PERRL, lids and conjunctivae normal ?ENMT: Mucous membranes are moist. Posterior pharynx clear of any exudate or lesions.Normal dentition.  ?Neck: normal, supple, no masses, no thyromegaly ?Respiratory: clear to auscultation bilaterally, no  wheezing, no crackles. Normal respiratory effort. No accessory muscle use.  ?Cardiovascular: Regular rate and rhythm, no murmurs / rubs / gallops. No extremity edema. 2+ pedal pulses. No carotid bruit

## 2021-09-04 NOTE — Consult Note (Signed)
Neurology Consultation ? ?Reason for Consult: TIA due to speech issues ?Referring Physician: Dr Roosevelt Locks of [facility] ? ?CC: speech problem, word finding difficulty ? ?History is obtained from: Chart, patient ? ?HPI: Dustin Hamilton is a 81 y.o. 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  Family is not at bedside.  Patient could not tell me what time this happened but was sometime this afternoon when he had about 20 to 30-minute episode of speech finding difficulty which resolved on its own. ?Patient was recently diagnosed with recurrent C. difficile colitis and was started on treatment for that as well as a presumptive UTI treatment. ?Currently denies ongoing diarrhea.  No headaches.  No shortness of breath or chest pain. ? ? ?Has been evaluated in the past for transient focal neurological events and multiple EEGs have been normal.  Not on antiepileptics. ?Brain imaging had numerous cortical based chronic microbleeds suggestive of cerebral amyloid angiopathy which was diagnosed at [facility] ? 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   ? ?LKW: Sometime this afternoon ?tpa given?: no, resolved symptoms ?Premorbid modified Rankin scale (mRS): 3 ? ?ROS: Full ROS was performed and is negative except as noted in the HPI.  ?Past Medical History:  ?Diagnosis Date  ? BPH (benign prostatic hyperplasia)   ? Cerebrovascular disease   ? Clostridium difficile diarrhea   ? Congenital anomaly of diaphragm   ? Elevated PSA   ? Glaucoma, both eyes   ?  Hemorrhoid   ? Hepatitis B surface antigen positive   ? 02-20-2011  ? History of adenomatous polyp of colon   ? 2007, 2009 and 2013  tubular adenoma's  ? History of alcohol abuse   ? quit 1963  ? History of cerebral parenchymal hemorrhage   ? 01/ 2006  left occiptial lobe related to hypertensive crisis  ? History of CVA (cerebrovascular accident)   ? 09-12-2012  left hippocampus/ amygdala junction and per MRI old white matter infarcts--  per pt residual short- term memory issues  ? History of fatty infiltration of liver hx visit's at [facility] , last visit 05/ 2014  ? elvated LFT's ,  via liver bx 2004 related to hx alcohol and drug abuse (quit 1964)  ? 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 HTN (hypertension)   ? Renal cyst, left   ? Stroke [facility])   ? hx of 3 strokes in past   ? 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 ?Family History  ?Problem Relation Age of Onset  ? Rheum arthritis Mother   ? Diabetes Mother   ? Stroke Mother   ? Heart attack Mother   ? Kidney failure Mother   ? Heart attack Father   ? Heart disease Maternal Grandmother   ? Rheum arthritis Maternal Grandmother   ? Diabetes Maternal Grandmother   ? Stroke Maternal Grandmother   ? Colon cancer Neg Hx   ? ?Social History:  ? reports that he quit smoking about  39 years ago. His smoking use included cigarettes. He has a 5.00 pack-year smoking history. He has never used smokeless tobacco. He reports that he does not drink alcohol and does not use drugs. ? ?Medications ? ?Current Facility-Administered Medications:  ?  acetaminophen (TYLENOL) tablet 650 mg, 650 mg, Oral, Q4H PRN **OR** acetaminophen (TYLENOL) 160 MG/5ML solution 650 mg, 650 mg, Per Tube, Q4H PRN **OR** acetaminophen (TYLENOL) suppository 650 mg, 650 mg, Rectal, Q4H PRN, Wynetta Fines T, MD ?  Derrill Memo ON 09/05/2021] allopurinol (ZYLOPRIM) tablet 100 mg, 100 mg, Oral, Daily, Zhang, Ping T,  MD ?  aspirin tablet 325 mg, 325 mg, Oral, Daily, Isla Pence, MD, 325 mg at 09/04/21 1627 ?  brimonidine (ALPHAGAN) 0.2 % ophthalmic solution 1 drop, 1 drop, Both Eyes, BID, Zhang, Pearletha Forge T, MD ?  cephALEXin (KEFLEX) capsule 500 mg, 500 mg, Oral, Q6H, Wynetta Fines T, MD, 500 mg at 09/04/21 1900 ?  diclofenac Sodium (VOLTAREN) 1 % topical gel 4 g, 4 g, Topical, QID PRN, Wynetta Fines T, MD ?  Derrill Memo ON 09/05/2021] dutasteride (AVODART) capsule 0.5 mg, 0.5 mg, Oral, Daily, Zhang, Ping T, MD ?  enoxaparin (LOVENOX) injection 40 mg, 40 mg, Subcutaneous, Q24H, Wynetta Fines T, MD, 40 mg at 09/04/21 1857 ?  feeding supplement (ENSURE ENLIVE / ENSURE PLUS) liquid, , Oral, Daily PRN, Wynetta Fines T, MD ?  fidaxomicin (DIFICID) tablet 200 mg, 200 mg, Oral, BID, Pham, Minh Q, RPH-CPP ?  [START ON 09/09/2021] fidaxomicin (DIFICID) tablet 200 mg, 200 mg, Oral, QODAY, Pham, Minh Q, RPH-CPP ?  latanoprost (XALATAN) 0.005 % ophthalmic solution 1 drop, 1 drop, Both Eyes, QHS, Zhang, Ping T, MD ?  lidocaine (LIDODERM) 5 % 1 patch, 1 patch, Transdermal, Daily PRN, Wynetta Fines T, MD ?  memantine [facility]) tablet 20 mg, 20 mg, Oral, QHS, Zhang, Pearletha Forge T, MD ?  methocarbamol (ROBAXIN) tablet 500 mg, 500 mg, Oral, BID PRN, Wynetta Fines T, MD ?  metoprolol succinate (TOPROL-XL) 24 hr tablet 12.5 mg, 12.5 mg, Oral, QHS, Zhang, Pearletha Forge T, MD ?  tamsulosin (FLOMAX) capsule 0.4 mg, 0.4 mg, Oral, QHS, Zhang, Pearletha Forge T, MD ?  tiZANidine (ZANAFLEX) tablet 4 mg, 4 mg, Oral, BID PRN, Lequita Halt, MD ? ?Exam: ?Current vital signs: ?BP (!) 156/90 (BP Location: Left Arm)   Pulse 61   Temp 98 ?F (36.7 ?C) (Oral)   Resp 16   SpO2 99%  ?Vital signs in last 24 hours: ?Temp:  [97.4 ?F (36.3 ?C)-98.9 ?F (37.2 ?C)] 98 ?F (36.7 ?C) (04/05 1922) ?Pulse Rate:  [59-70] 61 (04/05 1922) ?Resp:  [13-18] 16 (04/05 1922) ?BP: (109-156)/(73-100) 156/90 (04/05 1922) ?SpO2:  [96 %-99 %] 99 % (04/05 1922) ?General: Awake alert in no distress ?HEENT: Normocephalic  atraumatic ?CVs: Regular rhythm ?Respiratory: Breathing well saturating normally on room air ?Extremities warm well perfused ?Abdomen nondistended nontender ?Neurologic exam ?Awake alert oriented to self, fact that he is in the hospital, got the month wrong. ?No dysarthria ?Speech is somewhat slow ?Follows all commands ?Cranial nerves II to XII intact ?Motor examination with mild decreased range of motion of the right shoulder but no vertical drift in any fours ?Sensation intact to light touch all over ?Coordination intact ?NIH stroke scale ?1a Level of Conscious.: 0 ?1b LOC Questions: 1 ?1c LOC Commands: 0 ?2 Best Gaze: 0 ?3 Visual: 0 ?4 Facial Palsy: 0 ?5a Motor Arm - left: 0 ?5b Motor Arm - Right: 0 ?6a Motor Leg - Left: 0 ?6b Motor  Leg - Right: 0 ?7 Limb Ataxia: 0 ?8 Sensory: 0 ?9 Best Language: 0 ?10 Dysarthria: 1 ?11 Extinct. and Inatten.: 0 ?TOTAL: 2 ? ? ? ?Labs ?I have reviewed labs in epic and the results pertinent to this consultation are: ? ?CBC ?   ?Component Value Date/Time  ? WBC 5.0 09/04/2021 1324  ? RBC 4.84 09/04/2021 1324  ? HGB 14.8 09/04/2021 1324  ? HGB 17.2 11/12/2018 0950  ? HCT 45.8 09/04/2021 1324  ? HCT 52.0 (H) 11/12/2018 0950  ? PLT 134 (L) 09/04/2021 1324  ? PLT 144 (L) 11/12/2018 0950  ? MCV 94.6 09/04/2021 1324  ? MCV 92 11/12/2018 0950  ? MCH 30.6 09/04/2021 1324  ? MCHC 32.3 09/04/2021 1324  ? RDW 16.2 (H) 09/04/2021 1324  ? RDW 13.7 11/12/2018 0950  ? LYMPHSABS 1.2 09/04/2021 1324  ? LYMPHSABS 1.3 11/12/2018 0950  ? MONOABS 0.3 09/04/2021 1324  ? EOSABS 0.1 09/04/2021 1324  ? EOSABS 0.1 11/12/2018 0950  ? BASOSABS 0.0 09/04/2021 1324  ? BASOSABS 0.0 11/12/2018 0950  ? ? ?CMP  ?   ?Component Value Date/Time  ? NA 140 09/04/2021 1324  ? NA 143 11/12/2018 0950  ? K 4.2 09/04/2021 1324  ? CL 107 09/04/2021 1324  ? CO2 27 09/04/2021 1324  ? GLUCOSE 132 (H) 09/04/2021 1324  ? BUN 20 09/04/2021 1324  ? BUN 15 11/12/2018 0950  ? CREATININE 1.34 (H) 09/04/2021 1324  ? CALCIUM 9.9 09/04/2021  1324  ? PROT 6.8 09/04/2021 1324  ? ALBUMIN 3.3 (L) 09/04/2021 1324  ? AST 13 (L) 09/04/2021 1324  ? ALT 13 09/04/2021 1324  ? ALKPHOS 58 09/04/2021 1324  ? BILITOT 0.5 09/04/2021 1324  ? GFRNONAA 54 (L) 09/04/2021 13

## 2021-09-05 ENCOUNTER — Other Ambulatory Visit: Payer: Self-pay

## 2021-09-05 ENCOUNTER — Observation Stay (HOSPITAL_COMMUNITY): Payer: No Typology Code available for payment source

## 2021-09-05 ENCOUNTER — Encounter (HOSPITAL_COMMUNITY): Payer: Self-pay | Admitting: Internal Medicine

## 2021-09-05 DIAGNOSIS — G319 Degenerative disease of nervous system, unspecified: Secondary | ICD-10-CM | POA: Diagnosis not present

## 2021-09-05 DIAGNOSIS — R479 Unspecified speech disturbances: Secondary | ICD-10-CM | POA: Diagnosis not present

## 2021-09-05 DIAGNOSIS — I613 Nontraumatic intracerebral hemorrhage in brain stem: Secondary | ICD-10-CM | POA: Diagnosis not present

## 2021-09-05 DIAGNOSIS — G459 Transient cerebral ischemic attack, unspecified: Secondary | ICD-10-CM | POA: Diagnosis not present

## 2021-09-05 DIAGNOSIS — I6782 Cerebral ischemia: Secondary | ICD-10-CM | POA: Diagnosis not present

## 2021-09-05 DIAGNOSIS — R29818 Other symptoms and signs involving the nervous system: Secondary | ICD-10-CM | POA: Diagnosis not present

## 2021-09-05 LAB — CBC WITH DIFFERENTIAL/PLATELET
Abs Immature Granulocytes: 0.01 10*3/uL (ref 0.00–0.07)
Basophils Absolute: 0 10*3/uL (ref 0.0–0.1)
Basophils Relative: 1 %
Eosinophils Absolute: 0.2 10*3/uL (ref 0.0–0.5)
Eosinophils Relative: 3 %
HCT: 41.2 % (ref 39.0–52.0)
Hemoglobin: 13.4 g/dL (ref 13.0–17.0)
Immature Granulocytes: 0 %
Lymphocytes Relative: 27 %
Lymphs Abs: 1.5 10*3/uL (ref 0.7–4.0)
MCH: 30.5 pg (ref 26.0–34.0)
MCHC: 32.5 g/dL (ref 30.0–36.0)
MCV: 93.8 fL (ref 80.0–100.0)
Monocytes Absolute: 0.3 10*3/uL (ref 0.1–1.0)
Monocytes Relative: 5 %
Neutro Abs: 3.6 10*3/uL (ref 1.7–7.7)
Neutrophils Relative %: 64 %
Platelets: 132 10*3/uL — ABNORMAL LOW (ref 150–400)
RBC: 4.39 MIL/uL (ref 4.22–5.81)
RDW: 16.4 % — ABNORMAL HIGH (ref 11.5–15.5)
WBC: 5.5 10*3/uL (ref 4.0–10.5)
nRBC: 0 % (ref 0.0–0.2)

## 2021-09-05 LAB — COMPREHENSIVE METABOLIC PANEL
ALT: 12 U/L (ref 0–44)
AST: 12 U/L — ABNORMAL LOW (ref 15–41)
Albumin: 3 g/dL — ABNORMAL LOW (ref 3.5–5.0)
Alkaline Phosphatase: 50 U/L (ref 38–126)
Anion gap: 10 (ref 5–15)
BUN: 19 mg/dL (ref 8–23)
CO2: 22 mmol/L (ref 22–32)
Calcium: 9.2 mg/dL (ref 8.9–10.3)
Chloride: 106 mmol/L (ref 98–111)
Creatinine, Ser: 1.13 mg/dL (ref 0.61–1.24)
GFR, Estimated: >60 mL/min (ref >60)
Glucose, Bld: 100 mg/dL — ABNORMAL HIGH (ref 70–99)
Potassium: 4 mmol/L (ref 3.5–5.1)
Sodium: 138 mmol/L (ref 135–145)
Total Bilirubin: 0.7 mg/dL (ref 0.3–1.2)
Total Protein: 6.2 g/dL — ABNORMAL LOW (ref 6.5–8.1)

## 2021-09-05 LAB — LIPID PANEL
Cholesterol: 130 mg/dL (ref 0–200)
HDL: 28 mg/dL — ABNORMAL LOW (ref >40)
LDL Cholesterol: 87 mg/dL (ref 0–99)
Total CHOL/HDL Ratio: 4.6 RATIO
Triglycerides: 73 mg/dL (ref <150)
VLDL: 15 mg/dL (ref 0–40)

## 2021-09-05 LAB — HEMOGLOBIN A1C
Hgb A1c MFr Bld: 5.5 % (ref 4.8–5.6)
Mean Plasma Glucose: 111.15 mg/dL

## 2021-09-05 LAB — PHOSPHORUS: Phosphorus: 3 mg/dL (ref 2.5–4.6)

## 2021-09-05 LAB — MAGNESIUM: Magnesium: 1.8 mg/dL (ref 1.7–2.4)

## 2021-09-05 NOTE — Progress Notes (Signed)
?PROGRESS NOTE ? ? ? ?Dustin Hamilton  IDP:[id number] DOB: 12-14-1940 DOA: 09/04/2021 ?PCP: [facility]  ? ?Brief Narrative:  ?HPI per Dr. Wynetta Fines ?HPI: Dustin Hamilton is a 81 y.o. male with medical history significant of stroke, SVT status post ablation and PPM, recurrent C. difficile, BPH, cerebral parenchymal hemorrhage in 2006, dementia, presented with trouble speaking. ?  ?Family reported that this afternoon patient had 1 episode of unable to understand family's talking, and himself talking gibberish, and patient was having difficulty to move.  The whole episode lasted about 20-30 minutes and resolved by its own.  Patient however does not remember any details regarding the episode.  He denied any weakness numbness of any of the limbs, no vision problems no speech problems.  Last week, patient was diagnosed with recurrent C. difficile colitis after several episodes of diarrhea and was started on Dificid treatment since last Friday.  On this Monday, patient was started on nitrofurantoin for a putative UTI.  Patient however denied any diarrhea or urinary problems no fever or chills at this point. ? ?**Interim History  ? Neurology consulted and MRI and EEG done. Neurology to discuss with family about trial of Antiepileptics. PT/OT recommending Home Health  ? ? ?Assessment and Plan: ?No notes have been filed under this hospital service. ?Service: Hospitalist ? ? ?Acute Aphasia with suspected TIA vs Aphasic Seizures  ?Punctate Acute Infarct vs Artifact  ?-MRI showed "Punctate acute infarct versus artifact in the left middle cerebellar peduncle. No acute large territory infarct or other acute intracranial abnormality. Severe chronic small vessel ischemic disease and cerebral atrophy" , as PPM needs to be manipulated ?-Aspirin, check A1c and lipid panel ?-EEG done and showed no seizures or epileptiform discharges that were seen throughout the recording  ?-Echocardiogram showed EF of 55-60% ?-Other  DDx, given his kidney function, will discontinue nitrofurantoin ?-Neurology following  ?-PT/OT recommending Home Health  ?  ?Recurrent C. difficile colitis ?-Continue Dificid twice daily ?-Improving ?  ?UTI ?-Change nitrofurantoin to Keflex ?  ?Gout ?-No acute issue, continue allopurinol ?  ?BPH ?-Continue Flomax ?  ?Dementia ?-Mentation at baseline, continue Namenda ?  ?HLD ?-C/w Lipitor ?-Lipid Panel showed cholesterol/HDL ratio of 4.6, cholesterol 130, HDL 28, LDL 87, triglycerides of 73 and VLDL 50 ? ?HTN ?-C/w Metoprolol ?-Holding Norvasc ?-Last BP reading was 163/88 ? ?DVT prophylaxis: enoxaparin (LOVENOX) injection 40 mg Start: 09/04/21 1800 ? ?  Code Status: Full Code ?Family Communication: No family currently at ? ?Disposition Plan:  ?Level of care: Telemetry Medical ?Status is: Observation ?The patient remains OBS appropriate and will d/c before 2 midnights. ?  ?Consultants:  ?Neurology ? ?Procedures:  ?EEG  ?ECHO ? ?Antimicrobials:  ?Anti-infectives (From admission, onward)  ? ? Start     Dose/Rate Route Frequency Ordered Stop  ? 09/09/21 1000  fidaxomicin (DIFICID) tablet 200 mg       ? 200 mg Oral Every other day 09/04/21 1848 09/29/21 0959  ? 09/04/21 2200  fidaxomicin (DIFICID) tablet 200 mg       ? 200 mg Oral 2 times daily 09/04/21 1848 09/08/21 0959  ? 09/04/21 1800  cephALEXin (KEFLEX) capsule 500 mg       ? 500 mg Oral Every 6 hours 09/04/21 1552 09/08/21 1759  ? 09/04/21 1530  fidaxomicin (DIFICID) tablet 200 mg  Status:  Discontinued       ? 200 mg Oral 2 times daily 09/04/21 1517 09/04/21 1832  ? ?  ?  ?Subjective: ?  Seen and examined at bedside and he was doing okay but has started some speech difficulties.  Was not really able to give any proper subjective history.  Denied any complaints or pain.  No other concerns reported at this time ? ?Objective: ?Vitals:  ? 09/05/21 0337 09/05/21 0801 09/05/21 1142 09/05/21 1622  ?BP: 123/87 127/86 114/74 (!) 163/88  ?Pulse: 60 60 63 (!) 59  ?Resp: '17 19  17 17  '$ ?Temp: (!) 97.5 ?F (36.4 ?C) 97.6 ?F (36.4 ?C) 97.8 ?F (36.6 ?C) 97.8 ?F (36.6 ?C)  ?TempSrc: Oral Oral Oral Oral  ?SpO2: 97% 99% 98% 100%  ?Weight:  68.1 kg    ?Height:  '5\' 11"'$  (1.803 m)    ? ? ?Intake/Output Summary (Last 24 hours) at 09/05/2021 1856 ?Last data filed at 09/05/2021 1456 ?Gross per 24 hour  ?Intake 180 ml  ?Output 675 ml  ?Net -495 ml  ? ?Filed Weights  ? 09/05/21 0801  ?Weight: 68.1 kg  ? ?Examination: ?Physical Exam: ? ?Constitutional: Thin chronically ill-appearing African-American male in no acute distress ?Respiratory: Diminished to auscultation bilaterally with coarse breath sounds, no wheezing, rales, rhonchi or crackles. Normal respiratory effort and patient is not tachypenic. No accessory muscle use.  Unlabored breathing ?Cardiovascular: RRR, no murmurs / rubs / gallops. S1 and S2 auscultated.  ?Abdomen: Soft, non-tender, non-distended. No masses palpated. No appreciable hepatosplenomegaly. Bowel sounds positive.  ?GU: Deferred. ?Musculoskeletal: No clubbing / cyanosis of digits/nails. No joint deformity upper and lower extremities.  ?Skin: No rashes, lesions, ulcers. No induration; Warm and dry.  ?Neurologic: Has some difficulty speaking ?Psychiatric: Impaired judgment and insight. Alert and oriented x 1. Normal mood and appropriate affect.  ? ?Data Reviewed: I have personally reviewed following labs and imaging studies ? ?CBC: ?Recent Labs  ?Lab 09/04/21 ?1324 09/05/21 ?0109  ?WBC 5.0 5.5  ?NEUTROABS 3.4 3.6  ?HGB 14.8 13.4  ?HCT 45.8 41.2  ?MCV 94.6 93.8  ?PLT 134* 132*  ? ?Basic Metabolic Panel: ?Recent Labs  ?Lab 09/04/21 ?1324 09/05/21 ?0109  ?NA 140 138  ?K 4.2 4.0  ?CL 107 106  ?CO2 27 22  ?GLUCOSE 132* 100*  ?BUN 20 19  ?CREATININE 1.34* 1.13  ?CALCIUM 9.9 9.2  ?MG  --  1.8  ?PHOS  --  3.0  ? ?GFR: ?Estimated Creatinine Clearance: 50.2 mL/min (by C-G formula based on SCr of 1.13 mg/dL). ?Liver Function Tests: ?Recent Labs  ?Lab 09/04/21 ?1324 09/05/21 ?0109  ?AST 13* 12*  ?ALT  13 12  ?ALKPHOS 58 50  ?BILITOT 0.5 0.7  ?PROT 6.8 6.2*  ?ALBUMIN 3.3* 3.0*  ? ?No results for input(s): LIPASE, AMYLASE in the last 168 hours. ?No results for input(s): AMMONIA in the last 168 hours. ?Coagulation Profile: ?Recent Labs  ?Lab 09/04/21 ?1324  ?INR 1.2  ? ?Cardiac Enzymes: ?No results for input(s): CKTOTAL, CKMB, CKMBINDEX, TROPONINI in the last 168 hours. ?BNP (last 3 results) ?No results for input(s): PROBNP in the last 8760 hours. ?HbA1C: ?Recent Labs  ?  09/05/21 ?0109  ?HGBA1C 5.5  ? ?CBG: ?No results for input(s): GLUCAP in the last 168 hours. ?Lipid Profile: ?Recent Labs  ?  09/05/21 ?0109  ?CHOL 130  ?HDL 28*  ?[place] 87  ?TRIG 73  ?CHOLHDL 4.6  ? ?Thyroid Function Tests: ?No results for input(s): TSH, T4TOTAL, FREET4, T3FREE, THYROIDAB in the last 72 hours. ?Anemia Panel: ?No results for input(s): VITAMINB12, FOLATE, FERRITIN, TIBC, IRON, RETICCTPCT in the last 72 hours. ?Sepsis Labs: ?No results for input(s):  PROCALCITON, LATICACIDVEN in the last 168 hours. ? ?Recent Results (from the past 240 hour(s))  ?Resp Panel by RT-PCR (Flu A&B, Covid) Nasopharyngeal Swab     Status: None  ? Collection Time: 09/04/21  5:34 PM  ? Specimen: Nasopharyngeal Swab; Nasopharyngeal(NP) swabs in vial transport medium  ?Result Value Ref Range Status  ? SARS Coronavirus 2 by RT PCR NEGATIVE NEGATIVE Final  ?  Comment: (NOTE) ?SARS-CoV-2 target nucleic acids are NOT DETECTED. ? ?The SARS-CoV-2 RNA is generally detectable in upper respiratory ?specimens during the acute phase of infection. The lowest ?concentration of SARS-CoV-2 viral copies this assay can detect is ?138 copies/mL. A negative result does not preclude SARS-Cov-2 ?infection and should not be used as the sole basis for treatment or ?other patient management decisions. A negative result may occur with  ?improper specimen collection/handling, submission of specimen other ?than nasopharyngeal swab, presence of viral mutation(s) within the ?areas targeted  by this assay, and inadequate number of viral ?copies(<138 copies/mL). A negative result must be combined with ?clinical observations, patient history, and epidemiological ?information. The expected re

## 2021-09-05 NOTE — TOC Initial Note (Signed)
Transition of Care (TOC) - Initial/Assessment Note  ? ? ?Patient Details  ?Name: Dustin Hamilton ?MRN: [id number] ?Date of Birth: 07-25-1940 ? ?Transition of Care (TOC) CM/SW Contact:    ?Pollie Friar, RN ?Phone Number: ?09/05/2021, 4:19 PM ? ?Clinical Narrative:                 ?Patient is from home with his spouse. He has needed DME at home. Spouse is with him most of the time. Spouse manages his medications and provides needed transportation.  ?Pt active with [facility] for home health services. CM will update [facility] on admission and resumption of services.  ?TOC following. ? ?Expected Discharge Plan: [place] ?Barriers to Discharge: Continued Medical Work up ? ? ?Patient Goals and CMS Choice ?  ?CMS Medicare.gov Compare Post Acute Care list provided to:: Patient ?Choice offered to / list presented to : Patient, Spouse ? ?Expected Discharge Plan and Services ?Expected Discharge Plan: [place] ?  ?Discharge Planning Services: CM Consult ?Post Acute Care Choice: Home Health ?Living arrangements for the past 2 months: [place] ?                ?  ?  ?  ?  ?  ?HH Arranged: RN, PT, OT, Speech Therapy ?[place] Agency: [place] ([place]) ?Date HH Agency Contacted: 09/05/21 ?  ?  ? ?Prior Living Arrangements/Services ?Living arrangements for the past 2 months: [place] ?Lives with:: Spouse ?Patient language and need for interpreter reviewed:: Yes ?Do you feel safe going back to the place where you live?: Yes      ?Need for Family Participation in Patient Care: Yes (Comment) ?Care giver support system in place?: Yes (comment) ?Current home services: Home OT, Home PT, Home RN ?Criminal Activity/Legal Involvement Pertinent to Current Situation/Hospitalization: No - Comment as needed ? ?Activities of Daily Living ?Home Assistive Devices/Equipment: Cane (specify quad or straight), Walker (specify type) ?ADL Screening (condition at time of  admission) ?Patient's cognitive ability adequate to safely complete daily activities?: Yes ?Is the patient deaf or have difficulty hearing?: No ?Does the patient have difficulty seeing, even when wearing glasses/contacts?: No ?Does the patient have difficulty concentrating, remembering, or making decisions?: Yes ?Patient able to express need for assistance with ADLs?: Yes (struggles with talking) ?Does the patient have difficulty dressing or bathing?: Yes ?Independently performs ADLs?: No ?Communication: Appropriate for developmental age (struggles to get words out) ?Dressing (OT): Needs assistance ?Is this a change from baseline?: Pre-admission baseline ?Grooming: Needs assistance ?Is this a change from baseline?: Pre-admission baseline ?Feeding: Independent ?Bathing: Needs assistance ?Is this a change from baseline?: Pre-admission baseline ?Toileting: Independent ?In/Out Bed: Independent ?Walks in Home: Independent with device (comment) (walker) ?Does the patient have difficulty walking or climbing stairs?: Yes ?Weakness of Legs: None ?Weakness of Arms/Hands: None ? ?Permission Sought/Granted ?  ?  ?   ?   ?   ?   ? ?Emotional Assessment ?Appearance:: Appears stated age ?Attitude/Demeanor/Rapport: Engaged ?Affect (typically observed): Accepting ?Orientation: : Oriented to Self, Oriented to Place, Oriented to Situation ?  ?Psych Involvement: No (comment) ? ?Admission diagnosis:  TIA (transient ischemic attack) [G45.9] ?Patient Active Problem List  ? Diagnosis Date Noted  ? Sepsis ([place]) 06/26/2021  ? Acute metabolic encephalopathy 59/74/1638  ? 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 Fever and chills 04/01/2021  ? 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 Complete heart block ([place])   ? Heart block   ?  Syncope 05/03/2020  ? History of stroke 05/03/2020  ? History of supraventricular tachycardia 05/03/2020  ? Thrombocytopenia ([place]) 05/03/2020  ? AKI (acute kidney injury) ([place]) 05/03/2020  ? Memory impairment 05/03/2020  ?  Atypical chest pain 01/27/2020  ? SVT (supraventricular tachycardia) ([place]) 06/03/2017  ? Dizziness 08/02/2014  ? CVA (cerebral infarction) 09/13/2012  ? Vertigo 09/12/2012  ? EAR PAIN 09/05/2009  ? BACK PAIN 06/20/2009  ? COLONIC POLYPS 05/06/2007  ? 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 NONDEPENDENT ALCOHOL ABUSE IN REMISSION 05/06/2007  ? Essential hypertension 05/06/2007  ? Hypertensive heart disease without heart failure 05/06/2007  ? INTRACRANIAL HEMORRHAGE 05/06/2007  ? TIA (transient ischemic attack) 05/06/2007  ? HEMORRHOIDS 05/06/2007  ? INGUINAL HERNIA, RIGHT 05/06/2007  ? RENAL CYST 05/06/2007  ? HEMATURIA UNSPECIFIED 05/06/2007  ? 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 PSA, INCREASED 05/06/2007  ? LIVER FUNCTION TESTS, ABNORMAL 05/06/2007  ? Benign prostatic hyperplasia with urinary obstruction 05/06/2007  ? ?PCP:  [facility] ?Pharmacy:   ?[place], [address] ?[phone] ?[place] [address] ?Phone: [phone] Fax: [phone] ? ? ? ? ?Social Determinants of Health (SDOH) Interventions ?  ? ?Readmission Risk Interventions ?   ? View : No data to display.  ?  ?  ?  ? ? ? ?

## 2021-09-05 NOTE — Evaluation (Signed)
Occupational Therapy Evaluation Patient Details Name: Dustin Hamilton MRN: [id number] DOB: 13-Nov-1940 Today's Date: 09/05/2021   History of Present Illness 81 y.o. M admitted on 09/04/21 due to episode of global aphasia and difficulty moving. CT negative. PMH significant for stroke, SVT status post ablation and PPM, recurrent C. difficile, BPH, cerebral parenchymal hemorrhage in 2006, dementia and glaucoma.   Clinical Impression   Pt admitted for concerns listed above. PTA pt reported that he was independent with functional mobility, using a rollator, and all ADL's. At this time, pt presents with increased weakness, balance deficits, and decreased activity tolerance. He is requiring min guard to min A for all ADL's and functional mobility, except for donning pants and shoes, which he needs mod A for. Recommending pt have HHOT along with 24/7 supervision/min guard, using rollator at all times. OT will follow acutely.      Recommendations for follow up therapy are one component of a multi-disciplinary discharge planning process, led by the attending physician.  Recommendations may be updated based on patient status, additional functional criteria and insurance authorization.   Follow Up Recommendations  Home health OT    Assistance Recommended at Discharge Frequent or constant Supervision/Assistance  Patient can return home with the following A little help with walking and/or transfers;A lot of help with bathing/dressing/bathroom;Assistance with cooking/housework;Direct supervision/assist for medications management;Direct supervision/assist for financial management;Assist for transportation;Help with stairs or ramp for entrance    Functional Status Assessment  Patient has had a recent decline in their functional status and demonstrates the ability to make significant improvements in function in a reasonable and predictable amount of time.  Equipment Recommendations  None recommended by OT     Recommendations for Other Services       Precautions / Restrictions Precautions Precautions: Fall Restrictions Weight Bearing Restrictions: No      Mobility Bed Mobility Overal bed mobility: Needs Assistance Bed Mobility: Supine to Sit, Sit to Supine     Supine to sit: Min guard Sit to supine: Min guard   General bed mobility comments: HOB elevated and increased time to complete, no physical assist needed    Transfers Overall transfer level: Needs assistance Equipment used: Rolling walker (2 wheels) Transfers: Sit to/from Stand Sit to Stand: Min guard           General transfer comment: No physical assist to stand, hands on due to balance deficits      Balance Overall balance assessment: Needs assistance Sitting-balance support: No upper extremity supported, Feet supported Sitting balance-Leahy Scale: Fair Sitting balance - Comments: Static balance good, dynamic balance fair, pt unable to tolerate much challenge to sitting balance   Standing balance support: Bilateral upper extremity supported, During functional activity, Reliant on assistive device for balance Standing balance-Leahy Scale: Poor Standing balance comment: reliant on RW                           ADL either performed or assessed with clinical judgement   ADL Overall ADL's : Needs assistance/impaired Eating/Feeding: Set up;Sitting   Grooming: Set up;Sitting   Upper Body Bathing: Min guard;Sitting   Lower Body Bathing: Minimal assistance;Sitting/lateral leans;Sit to/from stand   Upper Body Dressing : Minimal assistance;Sitting   Lower Body Dressing: Moderate assistance;Sitting/lateral leans;Sit to/from stand   Toilet Transfer: Minimal assistance;Ambulation   Toileting- Clothing Manipulation and Hygiene: Minimal assistance;Sitting/lateral lean   Tub/ Shower Transfer: Moderate assistance;Walk-in shower;Shower seat   Functional mobility during ADLs: Minimal assistance;Rolling  walker (2 wheels) General ADL Comments: Pt limited due to balance deficits.     Vision Baseline Vision/History: 3 Glaucoma Ability to See in Adequate Light: 1 Impaired Patient Visual Report: No change from baseline Vision Assessment?: No apparent visual deficits Additional Comments: Pt able to read white board across the room and peripherals intact. Tracking difficult due to limited cervical ROM     Perception     Praxis      Pertinent Vitals/Pain Pain Assessment Pain Assessment: No/denies pain     Hand Dominance Right   Extremity/Trunk Assessment Upper Extremity Assessment Upper Extremity Assessment: Overall WFL for tasks assessed   Lower Extremity Assessment Lower Extremity Assessment: Defer to PT evaluation   Cervical / Trunk Assessment Cervical / Trunk Assessment: Kyphotic   Communication Communication Communication: Expressive difficulties (difficulty with word finding this session)   Cognition Arousal/Alertness: Awake/alert Behavior During Therapy: WFL for tasks assessed/performed Overall Cognitive Status: History of cognitive impairments - at baseline                                 General Comments: Pt has dementia at baseline     General Comments  VSS on RA, pt getting frustrated with difficulty word finding    Exercises     Shoulder Instructions      Home Living Family/patient expects to be discharged to:: Private residence Living Arrangements: Spouse/significant other Available Help at Discharge: Family;Available 24 hours/day Type of Home: House Home Access: Ramped entrance     Home Layout: Two level;Full bath on main level;Bed/bath upstairs;Able to live on main level with bedroom/bathroom Alternate Level Stairs-Number of Steps: stair lift   Bathroom Shower/Tub: Walk-in shower;Door   [employer] Toilet: Standard Bathroom Accessibility: Yes How Accessible: Accessible via walker Home Equipment: Pharmacist, hospital (2  wheels);Rollator (4 wheels);Cane - single point;Wheelchair - manual;BSC/3in1   Additional Comments: aide for three hours in the morning and niece comes in the evening to assist      Prior Functioning/Environment Prior Level of Function : Needs assist             Mobility Comments: uses a rollator ADLs Comments: Aid assist in the morning with bathing and some dressing.        OT Problem List: Decreased strength;Decreased activity tolerance;Impaired balance (sitting and/or standing);Decreased coordination;Decreased cognition;Decreased safety awareness;Decreased knowledge of use of DME or AE      OT Treatment/Interventions: Self-care/ADL training;Therapeutic exercise;Energy conservation;DME and/or AE instruction;Therapeutic activities;Cognitive remediation/compensation;Balance training;Patient/family education    OT Goals(Current goals can be found in the care plan section) Acute Rehab OT Goals Patient Stated Goal: To go home OT Goal Formulation: With patient Time For Goal Achievement: 09/19/21 Potential to Achieve Goals: Good ADL Goals Pt Will Perform Lower Body Bathing: with supervision;sitting/lateral leans Pt Will Perform Lower Body Dressing: with min guard assist;sitting/lateral leans Pt Will Transfer to Toilet: with min guard assist;ambulating Pt Will Perform Toileting - Clothing Manipulation and hygiene: with min guard assist;sitting/lateral leans Additional ADL Goal #1: Pt will follow 1 step commands 100% of the time, independently during each session.  OT Frequency: Min 2X/week    Co-evaluation              AM-PAC OT "6 Clicks" Daily Activity     Outcome Measure Help from another person eating meals?: A Little Help from another person taking care of personal grooming?: A Little Help from another person toileting, which includes using toliet,  bedpan, or urinal?: A Little Help from another person bathing (including washing, rinsing, drying)?: A Lot Help from  another person to put on and taking off regular upper body clothing?: A Little Help from another person to put on and taking off regular lower body clothing?: A Lot 6 Click Score: 16   End of Session Equipment Utilized During Treatment: Gait belt;Rolling walker (2 wheels) Nurse Communication: Mobility status  Activity Tolerance: Patient tolerated treatment well Patient left: in bed;with call bell/phone within reach;with bed alarm set  OT Visit Diagnosis: Unsteadiness on feet (R26.81);Other abnormalities of gait and mobility (R26.89);Muscle weakness (generalized) (M62.81)                Time: 4098-1191 OT Time Calculation (min): 25 min Charges:  OT General Charges $OT Visit: 1 Visit OT Evaluation $OT Eval Moderate Complexity: 1 Mod OT Treatments $Therapeutic Activity: 8-22 mins  Jermiyah Ricotta H., OTR/L Acute Rehabilitation  Ardena Gangl Elane Charliegh Vasudevan 09/05/2021, 11:15 AM

## 2021-09-05 NOTE — Plan of Care (Signed)
Pt is alert oriented x 4.pt has male purewick in place. Pt used bed pan but was not successful in having bowel movement, just passed gas. No distress noted.  ? ? ?Problem: Education: ?Goal: Knowledge of disease or condition will improve ?Outcome: Progressing ?Goal: Knowledge of secondary prevention will improve (SELECT ALL) ?Outcome: Progressing ?Goal: Knowledge of patient specific risk factors will improve (INDIVIDUALIZE FOR PATIENT) ?Outcome: Progressing ?Goal: Individualized Educational Video(s) ?Outcome: Progressing ?  ?Problem: Self-Care: ?Goal: Ability to participate in self-care as condition permits will improve ?Outcome: Progressing ?Goal: Ability to communicate needs accurately will improve ?Outcome: Progressing ?  ?Problem: Nutrition: ?Goal: Risk of aspiration will decrease ?Outcome: Progressing ?  ?Problem: Ischemic Stroke/TIA Tissue Perfusion: ?Goal: Complications of ischemic stroke/TIA will be minimized ?Outcome: Progressing ?  ?

## 2021-09-05 NOTE — Care Management Obs Status (Signed)
MEDICARE OBSERVATION STATUS NOTIFICATION ? ? ?Patient Details  ?Name: Dustin Hamilton ?MRN: [id number] ?Date of Birth: [birth year] ? ? ?Medicare Observation Status Notification Given:  Yes ? ? ? ?Pollie Friar, RN ?09/05/2021, 4:17 PM ?

## 2021-09-05 NOTE — Progress Notes (Signed)
PT evaluation was done and pt may only need a RW for support given his otherwise good equipment inventory.  Agreed with PT on HHPT for follow up so will focus on strength and endurance with PT sessions in acute PT.  Pt is motivated to get better for home, and reports his wife can physically assist him safely.  Follow acutely for goals as are outlined below. ? 09/05/21 1200  ?PT Visit Information  ?Last PT Received On 09/05/21  ?Assistance Needed +1  ?History of Present Illness 81 y.o. M admitted on 09/04/21 due to episode of global aphasia and difficulty moving. CT negative. PMH significant for stroke, SVT status post ablation and PPM, recurrent C. difficile, BPH, cerebral parenchymal hemorrhage in 2006, dementia and glaucoma.  ?Precautions  ?Precautions Fall  ?Restrictions  ?Weight Bearing Restrictions No  ?Home Living  ?Family/patient expects to be discharged to: Private residence  ?Living Arrangements Spouse/significant other  ?Available Help at Discharge Family;Available 24 hours/day  ?Type of Home House  ?Home Access Ramped entrance  ?Home Layout Two level;Full bath on main level;Bed/bath upstairs;Able to live on main level with bedroom/bathroom  ?Alternate Level Stairs-Number of Steps stair lift  ?Bathroom Shower/Tub Walk-in shower;Door  ?Bathroom Toilet Standard  ?Bathroom Accessibility Yes  ?Home Equipment Shower seat;Rolling Walker (2 wheels);Rollator (4 wheels);Cane - single point;Wheelchair - manual;BSC/3in1  ?Additional Comments aide for three hours in the morning and niece comes in the evening to assist  ? Lives With Spouse  ?Prior Function  ?Prior Level of Function  Needs assist  ?Mobility Comments uses a rollator  ?ADLs Comments has aide in AM  ?Communication  ?Communication Expressive difficulties  ?Pain Assessment  ?Pain Assessment No/denies pain  ?Cognition  ?Arousal/Alertness Awake/alert  ?Behavior During Therapy [facility] for tasks assessed/performed  ?Overall Cognitive Status History of cognitive  impairments - at baseline  ?General Comments baseline verbally and cognitively  ?Upper Extremity Assessment  ?Upper Extremity Assessment Defer to OT evaluation  ?Lower Extremity Assessment  ?Lower Extremity Assessment Overall WFL for tasks assessed ?(motor control is more impacted cognitively)  ?Cervical / Trunk Assessment  ?Cervical / Trunk Assessment Kyphotic  ?Bed Mobility  ?Overal bed mobility Needs Assistance  ?Bed Mobility Supine to Sit;Sit to Supine  ?Supine to sit Min guard;Min assist  ?Sit to supine Min assist  ?General bed mobility comments elevation of HOB but assisted scooting to EOB and legs back to bed  ?Transfers  ?Overall transfer level Needs assistance  ?Equipment used Rolling walker (2 wheels)  ?Transfers Sit to/from Stand  ?Sit to Stand Min guard  ?General transfer comment min guard for safety  ?Ambulation/Gait  ?Ambulation/Gait assistance Min guard  ?Gait Distance (Feet) 60 Feet  ?Assistive device Rolling walker (2 wheels);1 person hand held assist  ?Gait Pattern/deviations Step-through pattern;Wide base of support;Decreased stride length  ?General Gait Details tends to turn widely but also needing cues in confined spaces to set up for sitting  ?Gait velocity reduced  ?Balance  ?Overall balance assessment Needs assistance  ?Sitting-balance support Feet supported  ?Sitting balance-Leahy Scale Fair  ?Standing balance support Bilateral upper extremity supported;During functional activity  ?Standing balance-Leahy Scale Poor  ?Exercises  ?Exercises Other exercises ?(LE strength grossly WFL)  ?PT - End of Session  ?Equipment Utilized During Treatment Gait belt  ?Activity Tolerance Patient limited by fatigue  ?Patient left in bed;with call bell/phone within reach;with bed alarm set  ?Nurse Communication Mobility status  ?PT Assessment  ?PT Recommendation/Assessment Patient needs continued PT services  ?PT Visit Diagnosis Unsteadiness  on feet (R26.81);Muscle weakness (generalized) (M62.81);Difficulty in  walking, not elsewhere classified (R26.2)  ?PT Problem List Decreased strength;Decreased balance;Decreased coordination;Decreased cognition;Decreased knowledge of use of DME  ?PT Plan  ?PT Frequency (ACUTE ONLY) Min 3X/week  ?PT Treatment/Interventions (ACUTE ONLY) DME instruction;Gait training;Functional mobility training;Therapeutic activities;Therapeutic exercise;Balance training;Neuromuscular re-education;Patient/family education  ?AM-PAC PT "6 Clicks" Mobility Outcome Measure (Version 2)  ?Help needed turning from your back to your side while in a flat bed without using bedrails? 4  ?Help needed moving from lying on your back to sitting on the side of a flat bed without using bedrails? 4  ?Help needed moving to and from a bed to a chair (including a wheelchair)? 3  ?Help needed standing up from a chair using your arms (e.g., wheelchair or bedside chair)? 3  ?Help needed to walk in hospital room? 3  ?Help needed climbing 3-5 steps with a railing?  3  ?6 Click Score 20  ?Consider Recommendation of Discharge To: Home with no services  ?Progressive Mobility  ?What is the highest level of mobility based on the progressive mobility assessment? Level 4 (Walks with assist in room) - Balance while marching in place and cannot step forward and back - Complete  ?PT Recommendation  ?Follow Up Recommendations Home health PT  ?Assistance recommended at discharge Intermittent Supervision/Assistance  ?Patient can return home with the following A little help with walking and/or transfers;A little help with bathing/dressing/bathroom;Assistance with cooking/housework;Assist for transportation;Help with stairs or ramp for entrance  ?Functional Status Assessment Patient has had a recent decline in their functional status and demonstrates the ability to make significant improvements in function in a reasonable and predictable amount of time.  ?PT equipment Rolling walker (2 wheels)  ?Individuals Consulted  ?Consulted and Agree with  Results and Recommendations Patient  ?Acute Rehab PT Goals  ?Patient Stated Goal to go home with wife  ?PT Goal Formulation With patient  ?Time For Goal Achievement 09/19/21  ?Potential to Achieve Goals Good  ?PT Time Calculation  ?PT Start Time (ACUTE ONLY) 1108  ?PT Stop Time (ACUTE ONLY) 1126  ?PT Time Calculation (min) (ACUTE ONLY) 18 min  ?PT General Charges  ?$$ ACUTE PT VISIT 1 Visit  ?PT Evaluation  ?$PT Eval Moderate Complexity 1 Mod  ?Written Expression  ?Dominant Hand Right  ?[name], PT PhD ?Acute Rehab Dept. Number: [facility] [phone] and [place] [phone] ? ?

## 2021-09-05 NOTE — Progress Notes (Addendum)
STROKE TEAM PROGRESS NOTE  ? ?HISTORY OF PRESENT ILLNESS (per record) ?Dustin Hamilton is a 81 y.o. 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  Family is not at bedside.  Patient could not tell me what time this happened but was sometime this afternoon when he had about 20 to 30-minute episode of speech finding difficulty which resolved on its own. ?Patient was recently diagnosed with recurrent C. difficile colitis and was started on treatment for that as well as a presumptive UTI treatment. ?Currently denies ongoing diarrhea.  No headaches.  No shortness of breath or chest pain. ?Has been evaluated in the past for transient focal neurological events and multiple EEGs have been normal.  Not on antiepileptics. 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   ?LKW: Sometime this afternoon ?tpa given?: no, resolved symptoms ?Premorbid modified Rankin scale (mRS): 3 ? ? ?INTERVAL HISTORY ?Patient being transported for MRI scan.  He still states his speech is not back to baseline and he is having some struggle speaking.  He appears alert and oriented x3 good naming repetition and comprehension.  No focal weakness. ?MRI scan of the brain shows no definite left hemispheric infarct in the MCA territory.  Tiny punctate artifact versus infarct in the left middle cerebellar peduncle which cannot explain his speech difficulties.   Numerous chronic microhemorrhages throughout both cerebral hemisphere as well as brainstem and cerebellum compatible with likely amyloid angiopathy versus hypertensive small vessel disease as well as generalized cerebral atrophy. ? ? ?OBJECTIVE ?Vitals:  ? 09/04/21 2307 09/05/21 0337 09/05/21 0801 09/05/21 1142  ?BP: 100/78 123/87 127/86 114/74  ?Pulse: 64 60 60 63  ?Resp: '16 17 19 17  '$ ?Temp: 98.3 ?F (36.8 ?C) (!) 97.5 ?F (36.4 ?C) 97.6 ?F (36.4 ?C) 97.8 ?F (36.6 ?C)  ?TempSrc:  Oral Oral Oral  ?SpO2: 97% 97% 99% 98%  ? ? ?CBC:  ?Recent Labs  ?Lab 09/04/21 ?1324 09/05/21 ?0109  ?WBC 5.0 5.5  ?NEUTROABS 3.4 3.6  ?HGB 14.8 13.4  ?HCT 45.8 41.2  ?MCV 94.6 93.8  ?PLT 134* 132*  ? ? ?Basic Metabolic Panel:  ?Recent Labs  ?Lab 09/04/21 ?1324 09/05/21 ?0109  ?NA 140 138  ?K 4.2 4.0  ?CL 107 106  ?CO2 27 22  ?GLUCOSE 132* 100*  ?BUN 20 19  ?CREATININE 1.34* 1.13  ?CALCIUM 9.9 9.2  ?MG  --  1.8  ?PHOS  --  3.0  ? ? ?Lipid Panel:  ?   ?Component Value Date/Time  ? CHOL 130 09/05/2021 0109  ? TRIG 73 09/05/2021 0109  ? HDL 28 (L) 09/05/2021 0109  ? CHOLHDL 4.6 09/05/2021 0109  ? VLDL 15 09/05/2021 0109  ? [place] 87 09/05/2021 0109  ? ?HgbA1c:  ?Lab Results  ?Component Value Date  ? HGBA1C 5.5 09/05/2021  ? ?Urine Drug Screen:  ?   ?Component Value Date/Time  ? LABOPIA NONE DETECTED 09/04/2021 1153  ? COCAINSCRNUR NONE DETECTED 09/04/2021 1153  ? LABBENZ NONE DETECTED 09/04/2021 1153  ? AMPHETMU NONE DETECTED 09/04/2021 1153  ?  THCU NONE DETECTED 09/04/2021 1153  ? LABBARB NONE DETECTED 09/04/2021 1153  ?  ?Alcohol Level  ?   ?Component Value Date/Time  ? ETH <10 09/04/2021 1850  ? ? ?IMAGING ? ? ?CT HEAD WO CONTRAST ? ?Result Date: 09/04/2021 ?CLINICAL DATA:  Neuro deficit, acute, stroke suspected. Transient aphasia. EXAM: CT HEAD WITHOUT CONTRAST TECHNIQUE: Contiguous axial images were obtained from the base of the skull through the vertex without intravenous contrast. RADIATION DOSE REDUCTION: This exam was performed  according to the departmental dose-optimization program which includes automated exposure control, adjustment of the mA and/or kV according to patient size and/or use of iterative reconstruction technique. COMPARISON:  Head CT 08/12/2021 and MRI 04/01/2021 FINDINGS: Brain: There is no evidence of an acute cortically based infarct, intracranial hemorrhage, mass, midline shift, or extra-axial fluid collection. Confluent hypodensities in the cerebral white matter bilaterally are unchanged and nonspecific but compatible with severe chronic small vessel ischemic disease. Moderately advanced cerebral atrophy is again noted. Vascular: Calcified atherosclerosis at the skull base. No hyperdense vessel. Skull: No fracture or suspicious osseous lesion. Sinuses/Orbits: Minimal mucosal thickening in the right maxillary sinus. Trace bilateral mastoid effusions. Bilateral cataract extraction. Other: None. IMPRESSION: 1. No evidence of acute intracranial abnormality. 2. Severe chronic small vessel ischemic disease. Electronically Signed   By: Logan Bores M.D.   On: 09/04/2021 13:19  ? ?MR BRAIN WO CONTRAST ? ?Result Date: 09/05/2021 ?CLINICAL DATA:  Neuro deficit, acute, stroke suspected. Word-finding difficulty. EXAM: MRI HEAD WITHOUT CONTRAST TECHNIQUE: Multiplanar, multiecho pulse sequences of the brain and surrounding structures were obtained without intravenous contrast. COMPARISON:  Head CT 09/04/2021 and MRI 04/01/2021 FINDINGS: Brain: There is a 3 mm focus of mild trace diffusion weighted signal hyperintensity and questionably reduced ADC in the left middle cerebellar peduncle which is indeterminate for an acute infarct. Diffusion-weighted images are noted to be noisy in general. No larger acute infarct, mass, midline shift, or extra-axial fluid collection is identified. Numerous chronic microhemorrhages are again noted throughout the cerebral hemispheres, brainstem, and cerebellum, similar to the prior MRI. There is  moderately advanced cerebral atrophy. Confluent T2 hyperintensities in the cerebral white matter bilaterally are similar to the prior MRI and are nonspecific but compatible with severe chronic small vessel ischemic disease. Chronic lacunar infarcts are noted in the cerebral white matter bilaterally. Vascular: Major intracranial vascular flow voids are preserved. Skull and upper cervical spine: Unremarkable bone marrow signal. Sinuses/Orbits: Bilateral cataract extraction. No significant inflammatory changes in the paranasal sinuses. Trace bilateral mastoid effusions. Other: None. IMPRESSION: 1. Punctate acute infarct versus artifact in the left middle cerebellar peduncle. 2. No acute large territory infarct or other acute intracranial abnormality. 3. Severe chronic small vessel ischemic disease and cerebral atrophy. Electronically Signed   By: Logan Bores M.D.   On: 09/05/2021 13:07  ? ?DG Chest Portable 1 View ? ?Result Date: 09/04/2021 ?CLINICAL DATA:  Altered mental status.  Difficulty speaking. EXAM: PORTABLE CHEST 1 VIEW COMPARISON:  07/27/2021 FINDINGS: Pacer with leads at right atrium and right ventricle. No lead discontinuity. Moderate right hemidiaphragm elevation. Midline trachea. Normal heart size. No pleural effusion or pneumothorax. Volume loss with scarring or atelectasis in the right lung base, adjacent to the elevated diaphragm. Clear left lung. IMPRESSION: No acute cardiopulmonary disease. Moderate right hemidiaphragm elevation, as before. Electronically Signed   By: Abigail Miyamoto M.D.   On: 09/04/2021 12:33  ? ?ECHOCARDIOGRAM COMPLETE ? ?Result Date: 09/04/2021 ?   ECHOCARDIOGRAM REPORT   Patient Name:  [name] Date of Exam: 09/04/2021 Medical Rec #:  [id number]          Height:       71.0 in Accession #:    [id number]         Weight:       150.1 lb Date of Birth:  [birth year]          BSA:          1.867 m? Patient Age:    30 years           BP:           133/79 mmHg Patient Gender: M                   HR:           60 bpm. Exam Location:  Inpatient Procedure: 2D Echo, Cardiac Doppler, Color Doppler and Intracardiac            Opacification Agent Indications:    TIA  History:        Patient has prior histo

## 2021-09-05 NOTE — Progress Notes (Signed)
EEG complete - results pending 

## 2021-09-05 NOTE — Hospital Course (Signed)
HPI per Dr. Wynetta Fines ?HPI: Dustin Hamilton is a 81 y.o. male with medical history significant of stroke, SVT status post ablation and PPM, recurrent C. difficile, BPH, cerebral parenchymal hemorrhage in 2006, dementia, presented with trouble speaking. ?  ?Family reported that this afternoon patient had 1 episode of unable to understand family's talking, and himself talking gibberish, and patient was having difficulty to move.  The whole episode lasted about 20-30 minutes and resolved by its own.  Patient however does not remember any details regarding the episode.  He denied any weakness numbness of any of the limbs, no vision problems no speech problems.  Last week, patient was diagnosed with recurrent C. difficile colitis after several episodes of diarrhea and was started on Dificid treatment since last Friday.  On this Monday, patient was started on nitrofurantoin for a putative UTI.  Patient however denied any diarrhea or urinary problems no fever or chills at this point. ? ?**Interim History  ? Neurology consulted and MRI and EEG done. Neurology to discuss with family about trial of Antiepileptics. PT/OT recommending Home Health ?

## 2021-09-05 NOTE — Procedures (Signed)
Patient Name: Dustin Hamilton  ?MRN: [id number]  ?Epilepsy Attending: Lora Havens  ?Referring Physician/Provider: Kerney Elbe, DO ?Date: 09/05/2021 ?Duration: 23.23 mins ? ?Patient history: 81 y.o. male with history of dementia, PPM, Hx of SVT ablation, glaucoma, recently treated C-diff, cerebral amyloid angiopathy (diagnosed [facility]) with multiple "amyloid spells"/transient focal neurological deficits associated with cerebral amyloid angiopathy, prior history of ICH, hypertension, presenting with speech difficulties. 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  ? ?Level of alertness: Awake, asleep ? ?AEDs during EEG study: None ? ?Technical aspects: This EEG study was done with scalp electrodes positioned according to the 10-20 International system of electrode placement. Electrical activity was acquired at a sampling rate of '500Hz'$  and reviewed with a high frequency filter of '70Hz'$  and a low frequency filter of '1Hz'$ . EEG data were recorded continuously and digitally stored.  ? ?Description: The posterior dominant rhythm consists of 9 Hz activity of moderate voltage (25-35 uV) seen predominantly in posterior head regions, symmetric and reactive to eye opening and eye closing. Sleep was characterized by vertex waves, sleep spindles (12 to 14 Hz), maximal frontocentral region. Hyperventilation and photic stimulation were not performed.    ? ?Of note, study was technically difficult due to significant myogenic artifact. ? ?IMPRESSION: ?This technically difficult study is within normal limits. No seizures or epileptiform discharges were seen throughout the recording. ? ?Lora Havens  ? ?

## 2021-09-05 NOTE — Progress Notes (Signed)
SLP Cancellation Note ? ?Patient Details ?Name: Dustin Hamilton ?MRN: [id number] ?DOB: Aug 27, 1940 ? ? ?Cancelled treatment:       Reason Eval/Treat Not Completed: SLP screened, no needs identified, will sign off  Pt passed [facility] swallow screen and pt's RN, Dustin Hamilton, reported that the pt has been tolerating the current diet without symptoms of dysphagia. No formalized SLP swallow eval is needed per protocol and a speech-language-cognition was completed on 09/04/21.  ? ?Chayse Zatarain I. Hardin Negus, [place], CCC-SLP ?Acute Rehabilitation Services ?Office number [phone] ?Pager [phone] ? ?[name] ?09/05/2021, 2:33 PM ?

## 2021-09-06 DIAGNOSIS — R479 Unspecified speech disturbances: Secondary | ICD-10-CM | POA: Diagnosis not present

## 2021-09-06 DIAGNOSIS — G459 Transient cerebral ischemic attack, unspecified: Secondary | ICD-10-CM | POA: Diagnosis not present

## 2021-09-06 LAB — CBC WITH DIFFERENTIAL/PLATELET
Abs Immature Granulocytes: 0.01 10*3/uL (ref 0.00–0.07)
Basophils Absolute: 0 10*3/uL (ref 0.0–0.1)
Basophils Relative: 1 %
Eosinophils Absolute: 0.2 10*3/uL (ref 0.0–0.5)
Eosinophils Relative: 4 %
HCT: 47 % (ref 39.0–52.0)
Hemoglobin: 16.2 g/dL (ref 13.0–17.0)
Immature Granulocytes: 0 %
Lymphocytes Relative: 27 %
Lymphs Abs: 1.2 10*3/uL (ref 0.7–4.0)
MCH: 31 pg (ref 26.0–34.0)
MCHC: 34.5 g/dL (ref 30.0–36.0)
MCV: 90 fL (ref 80.0–100.0)
Monocytes Absolute: 0.3 10*3/uL (ref 0.1–1.0)
Monocytes Relative: 6 %
Neutro Abs: 2.7 10*3/uL (ref 1.7–7.7)
Neutrophils Relative %: 62 %
Platelets: 138 10*3/uL — ABNORMAL LOW (ref 150–400)
RBC: 5.22 MIL/uL (ref 4.22–5.81)
RDW: 15.9 % — ABNORMAL HIGH (ref 11.5–15.5)
WBC: 4.3 10*3/uL (ref 4.0–10.5)
nRBC: 0 % (ref 0.0–0.2)

## 2021-09-06 LAB — COMPREHENSIVE METABOLIC PANEL
ALT: 12 U/L (ref 0–44)
AST: 14 U/L — ABNORMAL LOW (ref 15–41)
Albumin: 3.5 g/dL (ref 3.5–5.0)
Alkaline Phosphatase: 59 U/L (ref 38–126)
Anion gap: 6 (ref 5–15)
BUN: 14 mg/dL (ref 8–23)
CO2: 24 mmol/L (ref 22–32)
Calcium: 9.9 mg/dL (ref 8.9–10.3)
Chloride: 108 mmol/L (ref 98–111)
Creatinine, Ser: 0.86 mg/dL (ref 0.61–1.24)
GFR, Estimated: >60 mL/min (ref >60)
Glucose, Bld: 89 mg/dL (ref 70–99)
Potassium: 4.1 mmol/L (ref 3.5–5.1)
Sodium: 138 mmol/L (ref 135–145)
Total Bilirubin: 0.9 mg/dL (ref 0.3–1.2)
Total Protein: 7.2 g/dL (ref 6.5–8.1)

## 2021-09-06 LAB — PHOSPHORUS: Phosphorus: 2.7 mg/dL (ref 2.5–4.6)

## 2021-09-06 LAB — MAGNESIUM: Magnesium: 1.8 mg/dL (ref 1.7–2.4)

## 2021-09-06 MED ORDER — ATORVASTATIN CALCIUM 40 MG PO TABS: 20.0000 mg | ORAL_TABLET | Freq: Every day | ORAL | 0 refills | Status: DC

## 2021-09-06 MED ORDER — MAGNESIUM SULFATE 2 GM/50ML IV SOLN
2.0000 g | Freq: Once | INTRAVENOUS | Status: AC
  Administered 2021-09-06: 2 g via INTRAVENOUS
  Filled 2021-09-06: qty 50

## 2021-09-06 MED ORDER — CEPHALEXIN 500 MG PO CAPS: 500.0000 mg | ORAL_CAPSULE | Freq: Four times a day (QID) | ORAL | 0 refills | Status: DC

## 2021-09-06 MED ORDER — CEPHALEXIN 500 MG PO CAPS: 500.0000 mg | ORAL_CAPSULE | Freq: Four times a day (QID) | ORAL | 0 refills | Status: AC

## 2021-09-06 MED ORDER — ASPIRIN EC 81 MG PO TBEC
81.0000 mg | DELAYED_RELEASE_TABLET | Freq: Every day | ORAL | Status: DC
  Administered 2021-09-06: 81 mg via ORAL
  Filled 2021-09-06: qty 1

## 2021-09-06 MED ORDER — ASPIRIN 81 MG PO TBEC: 81.0000 mg | DELAYED_RELEASE_TABLET | Freq: Every day | ORAL | 11 refills | Status: DC

## 2021-09-06 NOTE — Progress Notes (Signed)
AVS given and reviewed with pt and pt's wife at bedside. Medications discussed. All questions answered to satisfaction. Pt and pt's wife verbalized understanding of information given. Pt escorted off the unit with all belongings via wheelchair by staff member.   ?

## 2021-09-06 NOTE — Discharge Summary (Addendum)
Physician Discharge Summary  ?OLOF Hamilton BMW:[id number] DOB: 1941-01-18 DOA: 09/04/2021 ? ?PCP: [facility] ? ?Admit date: 09/04/2021 ?Discharge date: 09/06/2021 ? ?Admitted From: Home ?Disposition: Home with Home Health ? ?Recommendations for Outpatient Follow-up:  ?Follow up with PCP in 1-2 weeks ?Follow-up with neurology in outpatient setting ?Please obtain CBC/CMP, mag, Phos in one week ?Please follow up on the following pending results: ? ?Home Health: YES  ?Equipment/Devices: None   ? ?Discharge Condition: Stable   ?CODE STATUS: FULL CODE ?Diet recommendation: Heart healthy diet ? ?Brief/Interim Summary: ?HPI per Dr. Wynetta Fines ?HPI: Dustin Hamilton is a 81 y.o. male with medical history significant of stroke, SVT status post ablation and PPM, recurrent C. difficile, BPH, cerebral parenchymal hemorrhage in 2006, dementia, presented with trouble speaking. ?  ?Family reported that this afternoon patient had 1 episode of unable to understand family's talking, and himself talking gibberish, and patient was having difficulty to move.  The whole episode lasted about 20-30 minutes and resolved by its own.  Patient however does not remember any details regarding the episode.  He denied any weakness numbness of any of the limbs, no vision problems no speech problems.  Last week, patient was diagnosed with recurrent C. difficile colitis after several episodes of diarrhea and was started on Dificid treatment since last Friday.  On this Monday, patient was started on nitrofurantoin for a putative UTI.  Patient however denied any diarrhea or urinary problems no fever or chills at this point. ?  ?**Interim History  ? Neurology consulted and MRI and EEG done.  Patient improved and neurology discussed trial of antiepileptics and patient's family did not want any.  Neurology felt that his speech spells could have been related to seizures but given his multiple microhemorrhages and risk for intracerebral  hemorrhage given his tiny punctate cerebral infarcts likely from small vessel disease they recommended aspirin 81 mg along with aggressive risk factor modification.  PT OT recommended home health.  He was stable for discharge and will need to follow-up with PCP and his neurologist at [facility]. ? ?Discharge Diagnoses:  ?Principal Problem: ?  TIA (transient ischemic attack) ? ?Acute Aphasia with suspected TIA vs Aphasic Seizures  ?Punctate Acute Infarct vs Artifact  ?-MRI showed "Punctate acute infarct versus artifact in the left middle cerebellar peduncle. No acute large territory infarct or other acute intracranial abnormality. Severe chronic small vessel ischemic disease and cerebral atrophy" , as PPM needs to be manipulated ?-Aspirin, check A1c and lipid panel ?-EEG done and showed no seizures or epileptiform discharges that were seen throughout the recording  ?-Echocardiogram showed EF of 55-60% ?-Other DDx, given his kidney function, will discontinue nitrofurantoin continue Keflex ?-Neurology following and discussed with the wife about antiepileptics and they do not try any.  They are continuing aspirin 81 mg daily along with aggressive risk factor modification ?-PT/OT recommending Home Health  ?  ?Recurrent C. difficile colitis ?-Continue Dificid twice daily ?-Improving ?  ?UTI ?-Change nitrofurantoin to Keflex and continue for completion of course ?  ?Gout ?-No acute issue, continue allopurinol ?  ?BPH ?-Continue Flomax ?  ?Dementia ?-Mentation at baseline, continue Namenda ?  ?HLD ?-C/w Lipitor ?-Lipid Panel showed cholesterol/HDL ratio of 4.6, cholesterol 130, HDL 28, LDL 87, triglycerides of 73 and VLDL 50 ?  ?HTN ?-C/w Metoprolol ?-Holding Norvasc ?-Last BP reading was 156/93 ? ?Thrombocytopenia ?-Mild and improving ?-Count went from 132 is now 137 ?-Repeat CBC within 1 week ? ?Neck pain ?-Chronic and was  worked up for this in the past ?-Improved with analgesics, heat and acetaminophen ? ?Discharge  Instructions ? ?Discharge Instructions   ? ? Call MD for:  difficulty breathing, headache or visual disturbances   Complete by: As directed ?  ? Call MD for:  extreme fatigue   Complete by: As directed ?  ? Call MD for:  hives   Complete by: As directed ?  ? Call MD for:  persistant dizziness or light-headedness   Complete by: As directed ?  ? Call MD for:  persistant nausea and vomiting   Complete by: As directed ?  ? Call MD for:  redness, tenderness, or signs of infection (pain, swelling, redness, odor or green/yellow discharge around incision site)   Complete by: As directed ?  ? Call MD for:  severe uncontrolled pain   Complete by: As directed ?  ? Call MD for:  temperature >100.4   Complete by: As directed ?  ? Diet - low sodium heart healthy   Complete by: As directed ?  ? Discharge instructions   Complete by: As directed ?  ? You were cared for by a hospitalist during your hospital stay. If you have any questions about your discharge medications or the care you received while you were in the hospital after you are discharged, you can call the unit and ask to speak with the hospitalist on call if the hospitalist that took care of you is not available. Once you are discharged, your primary care physician will handle any further medical issues. Please note that NO REFILLS for any discharge medications will be authorized once you are discharged, as it is imperative that you return to your primary care physician (or establish a relationship with a primary care physician if you do not have one) for your aftercare needs so that they can reassess your need for medications and monitor your lab values. ? ?Follow up with PCP and Neurology within 1-2 weeks. Take all medications as prescribed. If symptoms change or worsen please return to the ED for evaluation  ? Increase activity slowly   Complete by: As directed ?  ? ?  ? ?Allergies as of 09/06/2021   ? ?   Reactions  ? Aricept [donepezil]   ? dizziness  ? Levetiracetam  Other (See Comments)  ? AMS  ? Penicillins Hives  ? Viagra [sildenafil]   ? dizziness  ? ?  ? ?  ?Medication List  ?  ? ?STOP taking these medications   ? ?nitrofurantoin 100 MG capsule ?Commonly known as: MACRODANTIN ?  ?predniSONE 10 MG tablet ?Commonly known as: DELTASONE ?  ? ?  ? ?TAKE these medications   ? ?acetaminophen 500 MG tablet ?Commonly known as: TYLENOL ?Take 1,000 mg by mouth every 6 (six) hours as needed for fever or mild pain. ?  ?allopurinol 100 MG tablet ?Commonly known as: Zyloprim ?Take 1 tablet (100 mg total) by mouth daily. ?  ?amLODipine 2.5 MG tablet ?Commonly known as: NORVASC ?Take 2.5 mg by mouth daily. ?  ?aspirin 81 MG EC tablet ?Take 1 tablet (81 mg total) by mouth daily. Swallow whole. ?  ?atorvastatin 40 MG tablet ?Commonly known as: LIPITOR ?Take 0.5 tablets (20 mg total) by mouth daily. ?  ?brimonidine 0.2 % ophthalmic solution ?Commonly known as: ALPHAGAN ?Place 1 drop into both eyes 2 (two) times daily. ?  ?cephALEXin 500 MG capsule ?Commonly known as: KEFLEX ?Take 1 capsule (500 mg total) by mouth every 6 (six)  hours for 2 days. ?  ?CLEAR EYES OP ?Place 2 drops into both eyes daily as needed (dry eyes). ?  ?colchicine 0.6 MG tablet ?Take 1 tablet (0.6 mg total) by mouth daily. ?  ?diclofenac Sodium 1 % Gel ?Commonly known as: VOLTAREN ?Apply 4 g topically 4 (four) times daily. ?What changed:  ?when to take this ?reasons to take this ?  ?dutasteride 0.5 MG capsule ?Commonly known as: AVODART ?Take 0.5 mg by mouth daily. ?  ?[place] ?Take 237 mLs by mouth daily as needed (appetite, nutrition). Clear/apple ?  ?fidaxomicin 200 MG Tabs tablet ?Commonly known as: DIFICID ?Take 1 tablet (200 mg total) by mouth 2 (two) times daily. ?What changed:  ?when to take this ?additional instructions ?  ?latanoprost 0.005 % ophthalmic solution ?Commonly known as: XALATAN ?Place 1 drop into both eyes at bedtime. ?  ?lidocaine 5 % ?Commonly known as: LIDODERM ?Place 1 patch  onto the skin daily as needed (back pain). Remove & Discard patch within 12 hours or as directed by MD ?  ?memantine 10 MG tablet ?Commonly known as: NAMENDA ?Take 20 mg by mouth at bedtime. ?  ?methocarbamol 500 MG t

## 2021-09-06 NOTE — Progress Notes (Signed)
STROKE TEAM PROGRESS NOTE  ? ?  ? ? ?INTERVAL HISTORY ?Patient`s wife is at the bedside.Marland Kitchen  He still states his speech is not back to baseline but seems to be able to speak quite clearly without hesitation with good repetition, comprehension and naming..  He appears alert and oriented x3   No focal weakness. ?EEG done yesterday was normal without epileptiform activity.  Vital signs are stable.  Neurological exam is unchanged. ? ? ?OBJECTIVE ?Vitals:  ? 09/05/21 2341 09/06/21 0316 09/06/21 0827 09/06/21 1252  ?BP: 105/77 (!) 141/87 (!) 156/93 93/73  ?Pulse: (!) 59 60 60 60  ?Resp: '15 15 17 16  '$ ?Temp: (!) 97.4 ?F (36.3 ?C) (!) 97.4 ?F (36.3 ?C) 97.7 ?F (36.5 ?C) 97.8 ?F (36.6 ?C)  ?TempSrc: Oral Oral Oral Oral  ?SpO2: 98% 100% 97% 97%  ?Weight:      ?Height:      ? ? ?CBC:  ?Recent Labs  ?Lab 09/05/21 ?0109 09/06/21 ?0630  ?WBC 5.5 4.3  ?NEUTROABS 3.6 2.7  ?HGB 13.4 16.2  ?HCT 41.2 47.0  ?MCV 93.8 90.0  ?PLT 132* 138*  ? ? ?Basic Metabolic Panel:  ?Recent Labs  ?Lab 09/05/21 ?0109 09/06/21 ?1601  ?NA 138 138  ?K 4.0 4.1  ?CL 106 108  ?CO2 22 24  ?GLUCOSE 100* 89  ?BUN 19 14  ?CREATININE 1.13 0.86  ?CALCIUM 9.2 9.9  ?MG 1.8 1.8  ?PHOS 3.0 2.7  ? ? ?Lipid Panel:  ?   ?Component Value Date/Time  ? CHOL 130 09/05/2021 0109  ? TRIG 73 09/05/2021 0109  ? HDL 28 (L) 09/05/2021 0109  ? CHOLHDL 4.6 09/05/2021 0109  ? VLDL 15 09/05/2021 0109  ? [place] 87 09/05/2021 0109  ? ?HgbA1c:  ?Lab Results  ?Component Value Date  ? HGBA1C 5.5 09/05/2021  ? ?Urine Drug Screen:  ?   ?Component Value Date/Time  ? LABOPIA NONE DETECTED 09/04/2021 1153  ? COCAINSCRNUR NONE DETECTED 09/04/2021 1153  ? LABBENZ NONE DETECTED 09/04/2021 1153  ? AMPHETMU NONE DETECTED 09/04/2021 1153  ? THCU NONE DETECTED 09/04/2021 1153  ? LABBARB NONE DETECTED 09/04/2021 1153  ?  ?Alcohol Level  ?   ?Component Value Date/Time  ? ETH <10 09/04/2021 1850  ? ? ?IMAGING ? ? ?MR BRAIN WO CONTRAST ? ?Result Date: 09/05/2021 ?CLINICAL DATA:  Neuro deficit, acute, stroke  suspected. Word-finding difficulty. EXAM: MRI HEAD WITHOUT CONTRAST TECHNIQUE: Multiplanar, multiecho pulse sequences of the brain and surrounding structures were obtained without intravenous contrast. COMPARISON:  Head CT 09/04/2021 and MRI 04/01/2021 FINDINGS: Brain: There is a 3 mm focus of mild trace diffusion weighted signal hyperintensity and questionably reduced ADC in the left middle cerebellar peduncle which is indeterminate for an acute infarct. Diffusion-weighted images are noted to be noisy in general. No larger acute infarct, mass, midline shift, or extra-axial fluid collection is identified. Numerous chronic microhemorrhages are again noted throughout the cerebral hemispheres, brainstem, and cerebellum, similar to the prior MRI. There is moderately advanced cerebral atrophy. Confluent T2 hyperintensities in the cerebral white matter bilaterally are similar to the prior MRI and are nonspecific but compatible with severe chronic small vessel ischemic disease. Chronic lacunar infarcts are noted in the cerebral white matter bilaterally. Vascular: Major intracranial vascular flow voids are preserved. Skull and upper cervical spine: Unremarkable bone marrow signal. Sinuses/Orbits: Bilateral cataract extraction. No significant inflammatory changes in the paranasal sinuses. Trace bilateral mastoid effusions. Other: None. IMPRESSION: 1. Punctate acute infarct versus artifact in the left middle cerebellar  peduncle. 2. No acute large territory infarct or other acute intracranial abnormality. 3. Severe chronic small vessel ischemic disease and cerebral atrophy. Electronically Signed   By: Logan Bores M.D.   On: 09/05/2021 13:07  ? ?EEG adult ? ?Result Date: 09/05/2021 ?Lora Havens, MD     09/05/2021  4:30 PM Patient Name: Dustin Hamilton MRN: [id number] Epilepsy Attending: Lora Havens Referring Physician/Provider: Kerney Elbe, DO Date: 09/05/2021 Duration: 23.23 mins Patient history: 81 y.o. 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 Level of alertness: Awake, asleep AEDs during EEG study: None Technical aspects: This EEG study was done with scalp electrodes positioned according to the 10-20 International system of electrode placement. Electrical activity was acquired at a sampling rate of '500Hz'$  and reviewed with a high frequency filter of '70Hz'$  and a low frequency filter of '1Hz'$ . EEG data were recorded continuously and digitally stored. Description: The posterior dominant rhythm consists of 9 Hz activity of moderate voltage (25-35 uV) seen predominantly in posterior head regions, symmetric and reactive to eye opening and eye closing. Sleep was characterized by vertex waves, sleep spindles (12 to 14 Hz), maximal frontocentral region. Hyperventilation and photic stimulation were not performed.   Of note, study was technically difficult due to significant myogenic artifact. IMPRESSION: This technically difficult study is within normal limits. No seizures or epileptiform discharges were seen throughout the recording. Dustin Hamilton  ? ?ECHOCARDIOGRAM COMPLETE ? ?Result Date: 09/04/2021 ?   ECHOCARDIOGRAM REPORT   Patient Name:   Dustin Hamilton Date of Exam: 09/04/2021 Medical Rec #:  [id number]          Height:       71.0 in Accession #:    [id number]         Weight:       150.1 lb Date of Birth:  [birth year]          BSA:          1.867 m? Patient Age:    81 years           BP:           133/79 mmHg Patient Gender: M                  HR:           60 bpm. Exam Location:  Inpatient Procedure: 2D Echo, Cardiac Doppler, Color Doppler and Intracardiac             Opacification Agent Indications:    TIA  History:        Patient has prior history of Echocardiogram examinations, most                 recent 04/02/2021. Pacemaker; Risk Factors:Hypertension. Hx CVA.  Sonographer:    Clayton Lefort RDCS (AE) Referring Phys: [id number] Lequita Halt  Sonographer Comments: Technically challenging study due to limited acoustic windows, Technically difficult study due to poor echo windows, suboptimal apical window, suboptimal subcostal window and suboptimal parasternal window. IMPRESSIONS  1. Left ventricular ejection fraction, by estimation, is 55 to 60%. The left ventricle has normal function. The left ventricle has no regional wall motion abnormalities. There is mild concentric left ventricular hypertrophy. Left ventricular  diastolic parameters are consistent with Grade I diastolic dysfunction (impaired relaxation).  2. Right ventricular systolic function is normal. The right ventricular size is mildly enlarged. There is normal pulmonary artery systolic pressure. The estimated right ventricular systolic pressure is 63.8 mmHg.  3. A small pericardial effusion is present. The pericardial effusion is anterior to the right ventricle.  4. The mitral valve is normal in structure. No evidence of mitral valve regurgitation.  5. The aortic valve is tricuspid. Aortic valve regurgitation is not visualized. No aortic stenosis is present. Comparison(s): No significant change from prior study. Prior images reviewed side by side. A similar small anterior pericardial effusion was present on that study. FINDINGS  Left Ventricle: Left ventricular ejection fraction, by estimation, is 55 to 60%. The left ventricle has normal function. The left ventricle has no regional wall motion abnormalities. Definity contrast agent was given IV to delineate the left ventricular  endocardial borders. The left ventricular internal cavity size was normal in size. There is mild concentric left ventricular hypertrophy. Left  ventricular diastolic parameters are consistent with Grade I diastolic dysfunction (impaired relaxation). Right Ventricle: The right ventricular size is mildly enlarged. No increase in right ventricular wall thickness.

## 2021-09-06 NOTE — TOC Transition Note (Signed)
Transition of Care (TOC) - CM/SW Discharge Note ? ? ?Patient Details  ?Name: Dustin Hamilton ?MRN: [id number] ?Date of Birth: 12/06/1940 ? ?Transition of Care (TOC) CM/SW Contact:  ?Pollie Friar, RN ?Phone Number: ?09/06/2021, 2:58 PM ? ? ?Clinical Narrative:    ?Patient is discharging home with resumption of home health services through [facility].  ?Pt has transportation home. ? ? ?Final next level of care: [place] ?Barriers to Discharge: No Barriers Identified ? ? ?Patient Goals and CMS Choice ?  ?CMS Medicare.gov Compare Post Acute Care list provided to:: Patient ?Choice offered to / list presented to : Patient, Spouse ? ?Discharge Placement ?  ?           ?  ?  ?  ?  ? ?Discharge Plan and Services ?  ?Discharge Planning Services: CM Consult ?Post Acute Care Choice: Home Health          ?  ?  ?  ?  ?  ?HH Arranged: RN, PT, OT, Speech Therapy ?Point Agency: [facility] ([place]) ?Date HH Agency Contacted: 09/05/21 ?  ?  ? ?Social Determinants of Health (SDOH) Interventions ?  ? ? ?Readmission Risk Interventions ?   ? View : No data to display.  ?  ?  ?  ? ? ? ? ? ?

## 2021-09-06 NOTE — Plan of Care (Signed)
?  Problem: Education: ?Goal: Knowledge of disease or condition will improve ?Outcome: Progressing ?Goal: Knowledge of secondary prevention will improve (SELECT ALL) ?Outcome: Progressing ?Goal: Knowledge of patient specific risk factors will improve (INDIVIDUALIZE FOR PATIENT) ?Outcome: Progressing ?  ?Problem: Self-Care: ?Goal: Ability to communicate needs accurately will improve ?Outcome: Progressing ?  ?Problem: Nutrition: ?Goal: Risk of aspiration will decrease ?Outcome: Progressing ?  ?

## 2021-09-06 NOTE — Progress Notes (Signed)
Occupational Therapy Treatment ?Patient Details ?Name: Dustin Hamilton ?MRN: [id number] ?DOB: [birth year] ?Today's Date: 09/06/2021 ? ? ?History of present illness 81 y.o. M admitted on 09/04/21 due to episode of global aphasia and difficulty moving. CT negative. PMH significant for stroke, SVT status post ablation and PPM, recurrent C. difficile, BPH, cerebral parenchymal hemorrhage in 2006, dementia and glaucoma. ?  ?OT comments ? Pt limited this session due to increased pain in his head and neck. Pt unable to tolerate sitting up, even with total assist provided by OT. MD and RN in room during session. Continuing to recommend [facility] OT at this time, however if pain continues, pt may need higher level of assist. OT will follow acutely  ? ?Recommendations for follow up therapy are one component of a multi-disciplinary discharge planning process, led by the attending physician.  Recommendations may be updated based on patient status, additional functional criteria and insurance authorization. ?   ?Follow Up Recommendations ? Home health OT  ?  ?Assistance Recommended at Discharge Frequent or constant Supervision/Assistance  ?Patient can return home with the following ? A little help with walking and/or transfers;A lot of help with bathing/dressing/bathroom;Assistance with cooking/housework;Direct supervision/assist for medications management;Direct supervision/assist for financial management;Assist for transportation;Help with stairs or ramp for entrance ?  ?Equipment Recommendations ? None recommended by OT  ?  ?Recommendations for Other Services   ? ?  ?Precautions / Restrictions Precautions ?Precautions: Fall ?Restrictions ?Weight Bearing Restrictions: No  ? ? ?  ? ?Mobility Bed Mobility ?Overal bed mobility: Needs Assistance ?Bed Mobility: Supine to Sit, Sit to Supine ?  ?  ?Supine to sit: Total assist, HOB elevated ?Sit to supine: Total assist, HOB elevated ?  ?General bed mobility comments: Pt unable to come up to full  sit due to increased pain. Even with total assist pt began resisting and ultimately had to return to supine ?  ? ?Transfers ?  ?  ?  ?  ?  ?  ?  ?  ?  ?General transfer comment: Deferred as pt was unable to sit up this session ?  ?  ?Balance Overall balance assessment: Needs assistance ?  ?  ?  ?  ?  ?  ?  ?  ?  ?  ?  ?  ?  ?  ?  ?  ?  ?  ?   ? ?ADL either performed or assessed with clinical judgement  ? ?ADL Overall ADL's : Needs assistance/impaired ?  ?  ?  ?  ?  ?  ?  ?  ?  ?  ?  ?  ?  ?  ?  ?  ?  ?  ?  ?General ADL Comments: Pt very limited this session, attempted sitting EOB multiple times, however unsuccessful due to pain ?  ? ?Extremity/Trunk Assessment   ?  ?  ?  ?  ?  ? ?Vision   ?  ?  ?Perception   ?  ?Praxis   ?  ? ?Cognition Arousal/Alertness: Awake/alert ?Behavior During Therapy: [facility] for tasks assessed/performed ?Overall Cognitive Status: History of cognitive impairments - at baseline ?  ?  ?  ?  ?  ?  ?  ?  ?  ?  ?  ?  ?  ?  ?  ?  ?General Comments: Pt has dementia at baseline ?  ?  ?   ?Exercises   ? ?  ?Shoulder Instructions   ? ? ?  ?General Comments Pain limiting  pt performance this session, Wife in room, RN and MD in room as well  ? ? ?Pertinent Vitals/ Pain       Pain Assessment ?Pain Assessment: Faces ?Pain Score: 10-Worst pain ever ?Faces Pain Scale: Hurts worst ?Pain Location: head and neck ?Pain Descriptors / Indicators: Aching, Constant, Discomfort, Grimacing, Moaning ?Pain Intervention(s): Limited activity within patient's tolerance, Monitored during session, Repositioned, Patient requesting pain meds-RN notified ? ?Home Living   ?  ?  ?  ?  ?  ?  ?  ?  ?  ?  ?  ?  ?  ?  ?  ?  ?  ?  ? ?  ?Prior Functioning/Environment    ?  ?  ?  ?   ? ?Frequency ? Min 2X/week  ? ? ? ? ?  ?Progress Toward Goals ? ?OT Goals(current goals can now be found in the care plan section) ? Progress towards OT goals: Not progressing toward goals - comment (increased pain) ? ?Acute Rehab OT Goals ?Patient Stated Goal:  none stated today ?OT Goal Formulation: With patient ?Time For Goal Achievement: 09/19/21 ?Potential to Achieve Goals: Good ?ADL Goals ?Pt Will Perform Lower Body Bathing: with supervision;sitting/lateral leans ?Pt Will Perform Lower Body Dressing: with min guard assist;sitting/lateral leans ?Pt Will Transfer to Toilet: with min guard assist;ambulating ?Pt Will Perform Toileting - Clothing Manipulation and hygiene: with min guard assist;sitting/lateral leans ?Additional ADL Goal #1: Pt will follow 1 step commands 100% of the time, independently during each session.  ?Plan Discharge plan remains appropriate;Frequency remains appropriate   ? ?Co-evaluation ? ? ?   ?  ?  ?  ?  ? ?  ?AM-PAC OT "6 Clicks" Daily Activity     ?Outcome Measure ? ? Help from another person eating meals?: A Little ?Help from another person taking care of personal grooming?: A Little ?Help from another person toileting, which includes using toliet, bedpan, or urinal?: A Little ?Help from another person bathing (including washing, rinsing, drying)?: A Lot ?Help from another person to put on and taking off regular upper body clothing?: A Little ?Help from another person to put on and taking off regular lower body clothing?: A Lot ?6 Click Score: 16 ? ?  ?End of Session   ? ?OT Visit Diagnosis: Unsteadiness on feet (R26.81);Other abnormalities of gait and mobility (R26.89);Muscle weakness (generalized) (M62.81) ?  ?Activity Tolerance Patient limited by pain ?  ?Patient Left in bed;with call bell/phone within reach;with bed alarm set;with nursing/sitter in room;with family/visitor present ?  ?Nurse Communication Mobility status;Patient requests pain meds ?  ? ?   ? ?Time: 3354-5625 ?OT Time Calculation (min): 15 min ? ?Charges: OT General Charges ?$OT Visit: 1 Visit ?OT Treatments ?$Therapeutic Activity: 8-22 mins ? ?Shelley Pooley H., OTR/L ?Acute Rehabilitation ? ?Saharra Santo Elane Yolanda Bonine ?09/06/2021, 1:24 PM ?

## 2021-09-06 NOTE — Progress Notes (Addendum)
[name] wife at bedside stating pt's phone is missing. Said pt had his phone in the bed with him before she left yesterday evening. Per patient and wife, it is an iPhone 47 with a black case on it. Unable to locate pt's phone in pt's room at this time. Charge nurse, Levada Dy, made aware. Evette Doffing, in linen department, notified.  ?

## 2021-09-06 NOTE — Progress Notes (Signed)
Nutrition Brief Note ? ?Patient identified on the Malnutrition Screening Tool (MST) Report. ? ?Admitting Dx: TIA (transient ischemic attack) [G45.9] ?PMH:  ?Past Medical History:  ?Diagnosis Date  ? BPH (benign prostatic hyperplasia)   ? Cerebrovascular disease   ? Clostridium difficile diarrhea   ? Congenital anomaly of diaphragm   ? Elevated PSA   ? Glaucoma, both eyes   ? Hemorrhoid   ? Hepatitis B surface antigen positive   ? 02-20-2011  ? History of adenomatous polyp of colon   ? 2007, 2009 and 2013  tubular adenoma's  ? History of alcohol abuse   ? quit 1963  ? History of cerebral parenchymal hemorrhage   ? 01/ 2006  left occiptial lobe related to hypertensive crisis  ? History of CVA (cerebrovascular accident)   ? 09-12-2012  left hippocampus/ amygdala junction and per MRI old white matter infarcts--  per pt residual short- term memory issues  ? History of fatty infiltration of liver hx visit's at [facility] , last visit 05/ 2014  ? elvated LFT's ,  via liver bx 2004 related to hx alcohol and drug abuse (quit 1964)  ? 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 HTN (hypertension)   ? Renal cyst, left   ? Stroke [facility])   ? hx of 3 strokes in past   ? 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 ?Medications reviewed. ?Labs: ?Recent Labs  ?Lab 09/04/21 ?1324 09/05/21 ?0109 09/06/21 ?1027  ?NA 140 138 138  ?K 4.2 4.0 4.1  ?CL 107 106 108  ?CO2 '27 22 24  '$ ?BUN '20 19 14  '$ ?CREATININE 1.34* 1.13 0.86  ?CALCIUM 9.9 9.2 9.9  ?MG  --  1.8 1.8  ?PHOS  --  3.0 2.7  ?GLUCOSE 132* 100* 89  ? ? ?Wt Readings from Last 15 Encounters:  ?09/05/21 68.1 kg  ?08/25/21 68.1 kg  ?08/06/21 68.1 kg  ?07/27/21 66.7 kg  ?06/26/21 72.6 kg  ?05/31/21 72.6 kg  ?05/24/21 74.1 kg  ?12/24/20 74.1 kg  ?10/24/20 75.3 kg  ?09/28/20 69.4 kg  ?09/11/20 74.4 kg  ?09/04/20 73.5 kg  ?08/23/20 73.5 kg  ?08/14/20 73.5 kg  ?07/24/20 73.5 kg  ? ? ?Body mass  index is 20.94 kg/m?Marland Kitchen Patient meets criteria for normal based on current BMI.  ? ?Current diet order is heart healthy, patient is consuming approximately 95% of meals at this time. Pt is observation status and noted for expected discharge today.  ? ?No nutrition interventions warranted at this time. If nutrition issues arise, please consult RD.  ? ? ?Theone Stanley., MS, RD, LDN (she/her/hers) ?RD pager number and weekend/on-call pager number located in [place]. ? ? ? ? ?

## 2021-09-09 NOTE — Progress Notes (Signed)
Remote pacemaker transmission.   

## 2021-09-23 DIAGNOSIS — Z8673 Personal history of transient ischemic attack (TIA), and cerebral infarction without residual deficits: Secondary | ICD-10-CM | POA: Diagnosis not present

## 2021-09-23 DIAGNOSIS — I442 Atrioventricular block, complete: Secondary | ICD-10-CM | POA: Diagnosis not present

## 2021-09-23 DIAGNOSIS — G319 Degenerative disease of nervous system, unspecified: Secondary | ICD-10-CM | POA: Diagnosis not present

## 2021-09-23 DIAGNOSIS — I680 Cerebral amyloid angiopathy: Secondary | ICD-10-CM | POA: Diagnosis not present

## 2021-09-23 DIAGNOSIS — Z950 Presence of cardiac pacemaker: Secondary | ICD-10-CM | POA: Diagnosis not present

## 2021-09-23 DIAGNOSIS — R404 Transient alteration of awareness: Secondary | ICD-10-CM | POA: Diagnosis not present

## 2021-09-23 DIAGNOSIS — I10 Essential (primary) hypertension: Secondary | ICD-10-CM | POA: Diagnosis not present

## 2021-09-23 DIAGNOSIS — F0150 Vascular dementia without behavioral disturbance: Secondary | ICD-10-CM | POA: Diagnosis not present

## 2021-09-23 DIAGNOSIS — R569 Unspecified convulsions: Secondary | ICD-10-CM | POA: Diagnosis not present

## 2021-09-23 DIAGNOSIS — R4689 Other symptoms and signs involving appearance and behavior: Secondary | ICD-10-CM | POA: Diagnosis not present

## 2021-09-27 ENCOUNTER — Emergency Department (HOSPITAL_COMMUNITY)
Admission: EM | Admit: 2021-09-27 | Discharge: 2021-09-28 | Disposition: A | Discharge status: Home / Self Care | Payer: No Typology Code available for payment source | Attending: Student | Admitting: Student

## 2021-09-27 ENCOUNTER — Emergency Department (HOSPITAL_COMMUNITY): Payer: No Typology Code available for payment source

## 2021-09-27 ENCOUNTER — Other Ambulatory Visit: Payer: Self-pay

## 2021-09-27 ENCOUNTER — Encounter (HOSPITAL_COMMUNITY): Payer: Self-pay

## 2021-09-27 DIAGNOSIS — G4489 Other headache syndrome: Secondary | ICD-10-CM | POA: Diagnosis not present

## 2021-09-27 DIAGNOSIS — Z950 Presence of cardiac pacemaker: Secondary | ICD-10-CM | POA: Diagnosis not present

## 2021-09-27 DIAGNOSIS — F0390 Unspecified dementia without behavioral disturbance: Secondary | ICD-10-CM | POA: Insufficient documentation

## 2021-09-27 DIAGNOSIS — M542 Cervicalgia: Secondary | ICD-10-CM | POA: Insufficient documentation

## 2021-09-27 DIAGNOSIS — D696 Thrombocytopenia, unspecified: Secondary | ICD-10-CM | POA: Insufficient documentation

## 2021-09-27 DIAGNOSIS — Z79899 Other long term (current) drug therapy: Secondary | ICD-10-CM | POA: Insufficient documentation

## 2021-09-27 DIAGNOSIS — I10 Essential (primary) hypertension: Secondary | ICD-10-CM | POA: Diagnosis not present

## 2021-09-27 DIAGNOSIS — Z7982 Long term (current) use of aspirin: Secondary | ICD-10-CM | POA: Diagnosis not present

## 2021-09-27 DIAGNOSIS — R519 Headache, unspecified: Secondary | ICD-10-CM | POA: Insufficient documentation

## 2021-09-27 LAB — CBC WITH DIFFERENTIAL/PLATELET
Abs Immature Granulocytes: 0.01 10*3/uL (ref 0.00–0.07)
Basophils Absolute: 0 10*3/uL (ref 0.0–0.1)
Basophils Relative: 1 %
Eosinophils Absolute: 0.1 10*3/uL (ref 0.0–0.5)
Eosinophils Relative: 2 %
HCT: 48 % (ref 39.0–52.0)
Hemoglobin: 16 g/dL (ref 13.0–17.0)
Immature Granulocytes: 0 %
Lymphocytes Relative: 23 %
Lymphs Abs: 1.5 10*3/uL (ref 0.7–4.0)
MCH: 31.2 pg (ref 26.0–34.0)
MCHC: 33.3 g/dL (ref 30.0–36.0)
MCV: 93.6 fL (ref 80.0–100.0)
Monocytes Absolute: 0.4 10*3/uL (ref 0.1–1.0)
Monocytes Relative: 5 %
Neutro Abs: 4.5 10*3/uL (ref 1.7–7.7)
Neutrophils Relative %: 69 %
Platelets: 120 10*3/uL — ABNORMAL LOW (ref 150–400)
RBC: 5.13 MIL/uL (ref 4.22–5.81)
RDW: 16.3 % — ABNORMAL HIGH (ref 11.5–15.5)
WBC: 6.5 10*3/uL (ref 4.0–10.5)
nRBC: 0 % (ref 0.0–0.2)

## 2021-09-27 LAB — COMPREHENSIVE METABOLIC PANEL
ALT: 14 U/L (ref 0–44)
AST: 23 U/L (ref 15–41)
Albumin: 3.8 g/dL (ref 3.5–5.0)
Alkaline Phosphatase: 61 U/L (ref 38–126)
Anion gap: 4 — ABNORMAL LOW (ref 5–15)
BUN: 18 mg/dL (ref 8–23)
CO2: 31 mmol/L (ref 22–32)
Calcium: 10 mg/dL (ref 8.9–10.3)
Chloride: 106 mmol/L (ref 98–111)
Creatinine, Ser: 1.01 mg/dL (ref 0.61–1.24)
GFR, Estimated: >60 mL/min (ref >60)
Glucose, Bld: 98 mg/dL (ref 70–99)
Potassium: 4.9 mmol/L (ref 3.5–5.1)
Sodium: 141 mmol/L (ref 135–145)
Total Bilirubin: 0.6 mg/dL (ref 0.3–1.2)
Total Protein: 7.3 g/dL (ref 6.5–8.1)

## 2021-09-27 MED ORDER — IBUPROFEN 400 MG PO TABS
600.0000 mg | ORAL_TABLET | Freq: Once | ORAL | Status: AC
  Administered 2021-09-27: 600 mg via ORAL
  Filled 2021-09-27: qty 1

## 2021-09-27 MED ORDER — SODIUM CHLORIDE 0.9 % IV BOLUS
1000.0000 mL | Freq: Once | INTRAVENOUS | Status: AC
  Administered 2021-09-27: 1000 mL via INTRAVENOUS

## 2021-09-27 MED ORDER — IBUPROFEN 400 MG PO TABS
ORAL_TABLET | ORAL | Status: AC
  Filled 2021-09-27: qty 1

## 2021-09-27 MED ORDER — METOCLOPRAMIDE HCL 5 MG/ML IJ SOLN
10.0000 mg | Freq: Once | INTRAMUSCULAR | Status: AC
  Administered 2021-09-27: 10 mg via INTRAVENOUS
  Filled 2021-09-27: qty 2

## 2021-09-27 MED ORDER — DEXAMETHASONE SODIUM PHOSPHATE 10 MG/ML IJ SOLN
10.0000 mg | Freq: Once | INTRAMUSCULAR | Status: AC
  Administered 2021-09-27: 10 mg via INTRAVENOUS
  Filled 2021-09-27: qty 1

## 2021-09-27 NOTE — ED Provider Notes (Signed)
?[facility] ?Provider Note ? ? ?CSN: [id number] ?Arrival date & time: 09/27/21  1832 ? ?  ? ?History ?Chief Complaint  ?Patient presents with  ? Headache  ? ? ?Dustin Hamilton is a 81 y.o. male with history of hypertension, TIA, CVA, pacemaker, dementia presents to the emergency department for evaluation of intermittent headaches he has had daily for the past 2 months.  He comes accompanied by his wife who voices the majority of the history given the patient's baseline dementia.  She reports he has had these headaches more whenever he has any change position or whenever he gets up in the morning, although it goes away after he gets up and walking.  He was recently seen at [facility] neurology per wife in where they performed an EEG that was normal.  They gave him a prescription for gabapentin for pain and referred him to headache specialist that works with dementia as well.  The wife reports that sometimes the headaches will cause some different effects to his speech but she has not noticed any unilateral weakness, seizure-like activity, or syncope.  He reports that his head ache is usually hurting in the back of his head and into his neck which is typical for him.  He denies any visual changes.  Wife reports that she gave him gabapentin last night, but did not give him any this morning.  She denies any changes with his speech during this current episode.  She reports he has had the pain for the past few hours.  She reports the patient is at his baseline. ? ? ?Headache ?Associated symptoms: neck pain   ?Associated symptoms: no abdominal pain, no back pain, no congestion, no diarrhea, no dizziness, no fever, no nausea, no sore throat, no vomiting and no weakness   ? ?  ? ?Home Medications ?Prior to Admission medications   ?Medication Sig Start Date End Date Taking? Authorizing Provider  ?acetaminophen (TYLENOL) 500 MG tablet Take 1,000 mg by mouth every 6 (six) hours as needed for  fever or mild pain.    [provider]  ?allopurinol (ZYLOPRIM) 100 MG tablet Take 1 tablet (100 mg total) by mouth daily. 04/05/21 04/05/22  Pokhrel, Corrie Mckusick, MD  ?amLODipine (NORVASC) 2.5 MG tablet Take 2.5 mg by mouth daily.    [provider]  ?aspirin 81 MG EC tablet Take 1 tablet (81 mg total) by mouth daily. Swallow whole. 09/06/21   Raiford Noble Latif, DO  ?atorvastatin (LIPITOR) 40 MG tablet Take 0.5 tablets (20 mg total) by mouth daily. 09/06/21   Raiford Noble Latif, DO  ?brimonidine (ALPHAGAN) 0.2 % ophthalmic solution Place 1 drop into both eyes 2 (two) times daily.    [provider]  ?colchicine 0.6 MG tablet Take 1 tablet (0.6 mg total) by mouth daily. 08/25/21   Horton, Alvin Critchley, DO  ?diclofenac Sodium (VOLTAREN) 1 % GEL Apply 4 g topically 4 (four) times daily. ?Patient taking differently: Apply 4 g topically daily as needed (pain). 06/05/21   Deno Etienne, DO  ?dutasteride (AVODART) 0.5 MG capsule Take 0.5 mg by mouth daily.    [provider]  ?fidaxomicin (DIFICID) 200 MG TABS tablet Take 1 tablet (200 mg total) by mouth 2 (two) times daily. ?Patient taking differently: Take 200 mg by mouth See admin instructions. Start date : 09/03/21. Take 200 mg 2 times a day for 5 days then take 200 mg every other day for 20 days. 07/29/21   McKinley, [place],  MD  ?latanoprost (XALATAN) 0.005 % ophthalmic solution Place 1 drop into both eyes at bedtime.     [provider]  ?lidocaine (LIDODERM) 5 % Place 1 patch onto the skin daily as needed (back pain). Remove & Discard patch within 12 hours or as directed by MD    [provider]  ?memantine (NAMENDA) 10 MG tablet Take 20 mg by mouth at bedtime.    [provider]  ?methocarbamol (ROBAXIN) 500 MG tablet Take 1 tablet (500 mg total) by mouth 2 (two) times daily. ?Patient not taking: Reported on 09/04/2021 06/05/21   Deno Etienne, DO  ?metoprolol succinate (TOPROL-XL) 25 MG 24 hr tablet Take 0.5 tablets (12.5 mg  total) by mouth at bedtime. 05/31/20   Rise Patience, DO  ?Naphazoline HCl (CLEAR EYES OP) Place 2 drops into both eyes daily as needed (dry eyes).    [provider]  ?Nutritional Supplements ([place] PO) Take 237 mLs by mouth daily as needed (appetite, nutrition). Clear/apple    [provider]  ?tamsulosin (FLOMAX) 0.4 MG CAPS capsule Take 0.4 mg by mouth at bedtime.    [provider]  ?tiZANidine (ZANAFLEX) 4 MG tablet Take 4 mg by mouth 2 (two) times daily as needed for muscle spasms. 06/11/21   [provider]  ?   ? ?Allergies    ?Aricept [donepezil], Levetiracetam, Penicillins, and Viagra [sildenafil]   ? ?Review of Systems   ?Review of Systems  ?Constitutional:  Negative for chills and fever.  ?HENT:  Negative for congestion, rhinorrhea and sore throat.   ?Eyes:  Negative for visual disturbance.  ?Respiratory:  Negative for shortness of breath.   ?Cardiovascular:  Negative for chest pain.  ?Gastrointestinal:  Negative for abdominal pain, diarrhea, nausea and vomiting.  ?Musculoskeletal:  Positive for neck pain. Negative for back pain.  ?Neurological:  Positive for headaches. Negative for dizziness, syncope, speech difficulty, weakness and light-headedness.  ? ?Physical Exam ?Updated Vital Signs ?BP (!) 160/88 (BP Location: Left Arm)   Pulse 65   Temp 98.5 ?F (36.9 ?C) (Oral)   Resp 15   Ht '5\' 11"'$  (1.803 m)   Wt 68.1 kg   SpO2 98%   BMI 20.94 kg/m?  ?Physical Exam ?Vitals and nursing note reviewed.  ?Constitutional:   ?   General: He is not in acute distress. ?   Appearance: Normal appearance. He is not ill-appearing or toxic-appearing.  ?HENT:  ?   Head: Normocephalic and atraumatic.  ?Eyes:  ?   General: No scleral icterus. ?   Extraocular Movements: Extraocular movements intact.  ?   Pupils: Pupils are equal, round, and reactive to light.  ?Neck:  ?   Comments: Bilateral paraspinal tenderness to palpation. No overlying skin changes, warmth, or  erythema.  ?Cardiovascular:  ?   Rate and Rhythm: Normal rate and regular rhythm.  ?Pulmonary:  ?   Effort: Pulmonary effort is normal. No respiratory distress.  ?   Breath sounds: Normal breath sounds.  ?Abdominal:  ?   General: Abdomen is flat. Bowel sounds are normal. There is no distension.  ?   Palpations: Abdomen is soft.  ?   Tenderness: There is no abdominal tenderness.  ?Musculoskeletal:     ?   General: No deformity.  ?   Cervical back: Normal range of motion. No rigidity.  ?Lymphadenopathy:  ?   Cervical: No cervical adenopathy.  ?Skin: ?   General: Skin is warm and dry.  ?Neurological:  ?  General: No focal deficit present.  ?   Mental Status: He is alert and oriented to person, place, and time. Mental status is at baseline.  ?   Cranial Nerves: Cranial nerves 2-12 are intact. No dysarthria or facial asymmetry.  ?   Sensory: No sensory deficit.  ?   Motor: No weakness.  ?   Coordination: Coordination normal. Finger-Nose-Finger Test normal.  ?   Comments: The patient is alert and oriented x 3. He recognizes his wife. Wife reports he is at baseline.  Cranial nerves II through XII intact.  PERRLA.  EOMI.  No sensory deficit.  Strength is equal in upper and lower bilateral extremities.  He has a normal coordination and normal finger-nose.  ? ? ?ED Results / Procedures / Treatments   ?Labs ?(all labs ordered are listed, but only abnormal results are displayed) ?Labs Reviewed  ?CBC WITH DIFFERENTIAL/PLATELET - Abnormal; Notable for the following components:  ?    Result Value  ? RDW 16.3 (*)   ? Platelets 120 (*)   ? All other components within normal limits  ?COMPREHENSIVE METABOLIC PANEL - Abnormal; Notable for the following components:  ? Anion gap 4 (*)   ? All other components within normal limits  ? ? ?EKG ?None ? ?Radiology ?CT Head Wo Contrast ? ?Result Date: 09/27/2021 ?CLINICAL DATA:  Headache, new or worsening (Age >= 50y) EXAM: CT HEAD WITHOUT CONTRAST TECHNIQUE: Contiguous axial images were  obtained from the base of the skull through the vertex without intravenous contrast. RADIATION DOSE REDUCTION: This exam was performed according to the departmental dose-optimization program which includes automate

## 2021-09-27 NOTE — ED Notes (Signed)
Received verbal report from Katrina Y RN at this time °

## 2021-09-27 NOTE — ED Notes (Signed)
Pt transported to ct at this time 

## 2021-09-27 NOTE — ED Triage Notes (Signed)
Hx of dementia. Here for headache that started at 1700. Denies dizziness or any other discomfort. Alert and oriented x 3 at baseline. Seen at multiple hospitals for same with no specific dx. Pt now denies headache upon arrival in ED.  ?

## 2021-09-27 NOTE — ED Notes (Signed)
Provider at bedside with pt and wife at this time ?

## 2021-09-28 NOTE — Discharge Instructions (Addendum)
You were seen here in the ER for evaluation of your bad headache. Your lab work was normal. Your head CT was unchanged from previous. I'm glad you are feeling better with your pain. Please follow up with your PCP or Neurologist for possible MRI of your neck.  ? ?Contact a doctor if: ?Medicine does not help your migraine. ?Your pain keeps coming back. ?Get help right away if: ?Your migraine becomes really bad and medicine does not help. ?You have a stiff neck and fever. ?You have trouble seeing. ?Your muscles are weak or you lose control of them. ?You lose your balance or have trouble walking. ?You feel like you will faint or you faint. ?You start having sudden, very bad headaches. ?You have a seizure. ?

## 2021-10-09 DIAGNOSIS — I10 Essential (primary) hypertension: Secondary | ICD-10-CM | POA: Diagnosis not present

## 2021-10-15 DIAGNOSIS — N390 Urinary tract infection, site not specified: Secondary | ICD-10-CM | POA: Diagnosis not present

## 2021-10-15 DIAGNOSIS — F0390 Unspecified dementia without behavioral disturbance: Secondary | ICD-10-CM | POA: Diagnosis not present

## 2021-10-15 DIAGNOSIS — E870 Hyperosmolality and hypernatremia: Secondary | ICD-10-CM | POA: Diagnosis not present

## 2021-10-15 DIAGNOSIS — I10 Essential (primary) hypertension: Secondary | ICD-10-CM | POA: Diagnosis not present

## 2021-10-23 DIAGNOSIS — E854 Organ-limited amyloidosis: Secondary | ICD-10-CM | POA: Diagnosis not present

## 2021-10-23 DIAGNOSIS — G309 Alzheimer's disease, unspecified: Secondary | ICD-10-CM | POA: Diagnosis not present

## 2021-10-23 DIAGNOSIS — F0280 Dementia in other diseases classified elsewhere without behavioral disturbance: Secondary | ICD-10-CM | POA: Diagnosis not present

## 2021-10-23 DIAGNOSIS — G4486 Cervicogenic headache: Secondary | ICD-10-CM | POA: Diagnosis not present

## 2021-10-23 DIAGNOSIS — I680 Cerebral amyloid angiopathy: Secondary | ICD-10-CM | POA: Diagnosis not present

## 2021-10-30 DIAGNOSIS — G3184 Mild cognitive impairment, so stated: Secondary | ICD-10-CM | POA: Diagnosis not present

## 2021-10-30 DIAGNOSIS — I69919 Unspecified symptoms and signs involving cognitive functions following unspecified cerebrovascular disease: Secondary | ICD-10-CM | POA: Diagnosis not present

## 2021-11-20 DIAGNOSIS — I69919 Unspecified symptoms and signs involving cognitive functions following unspecified cerebrovascular disease: Secondary | ICD-10-CM | POA: Diagnosis not present

## 2021-11-26 ENCOUNTER — Ambulatory Visit (INDEPENDENT_AMBULATORY_CARE_PROVIDER_SITE_OTHER): Payer: Medicare PPO

## 2021-11-26 DIAGNOSIS — I442 Atrioventricular block, complete: Secondary | ICD-10-CM

## 2021-11-26 LAB — CUP PACEART REMOTE DEVICE CHECK
Date Time Interrogation Session: 20230627085323
Implantable Lead Implant Date: 20211227
Implantable Lead Implant Date: 20211227
Implantable Lead Location: 753859
Implantable Lead Location: 753860
Implantable Lead Model: 377
Implantable Lead Model: 377
Implantable Lead Serial Number: 8000018960
Implantable Lead Serial Number: 8000042643
Implantable Pulse Generator Implant Date: 20211227
Pulse Gen Model: 407145
Pulse Gen Serial Number: 69915927

## 2021-11-28 DIAGNOSIS — F0280 Dementia in other diseases classified elsewhere without behavioral disturbance: Secondary | ICD-10-CM | POA: Diagnosis not present

## 2021-11-28 DIAGNOSIS — M4802 Spinal stenosis, cervical region: Secondary | ICD-10-CM | POA: Diagnosis not present

## 2021-11-28 DIAGNOSIS — G4486 Cervicogenic headache: Secondary | ICD-10-CM | POA: Diagnosis not present

## 2021-11-28 DIAGNOSIS — M5412 Radiculopathy, cervical region: Secondary | ICD-10-CM | POA: Diagnosis not present

## 2021-11-28 DIAGNOSIS — M4722 Other spondylosis with radiculopathy, cervical region: Secondary | ICD-10-CM | POA: Diagnosis not present

## 2021-11-28 DIAGNOSIS — G309 Alzheimer's disease, unspecified: Secondary | ICD-10-CM | POA: Diagnosis not present

## 2021-12-11 DIAGNOSIS — E871 Hypo-osmolality and hyponatremia: Secondary | ICD-10-CM | POA: Diagnosis not present

## 2021-12-11 DIAGNOSIS — R809 Proteinuria, unspecified: Secondary | ICD-10-CM | POA: Diagnosis not present

## 2021-12-11 DIAGNOSIS — I10 Essential (primary) hypertension: Secondary | ICD-10-CM | POA: Diagnosis not present

## 2021-12-13 IMAGING — MR MR HEAD W/O CM
9 of 10 series · 38 of 48 positions shown · non-contrast
Comparison: MRI head 09/24/2018

CLINICAL DATA: Recurrent syncope

EXAM:
MRI HEAD WITHOUT CONTRAST
TECHNIQUE: Multiplanar, multiecho pulse sequences of the brain and surrounding
structures were obtained without intravenous contrast.

[Series 4: DWI · axial · 3.0mm · 1.09mm/px · z∈[-61,+90]mm · 11 of 106 slices shown (1 of 4)]
[im 1/106]
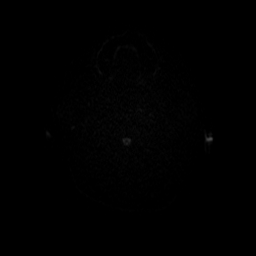
[im 11/106]
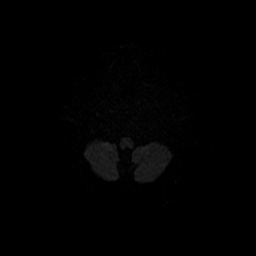
[im 22/106]
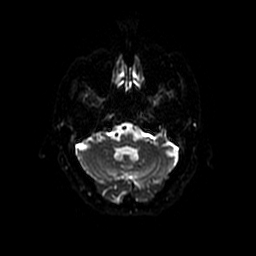
[im 32/106]
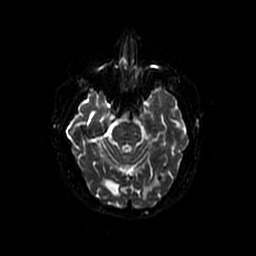
[im 43/106]
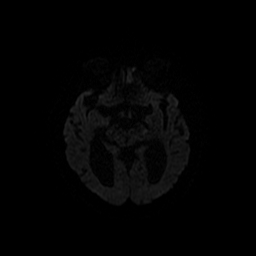
[im 53/106]
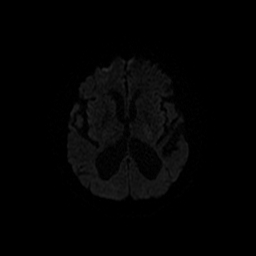
[im 64/106]
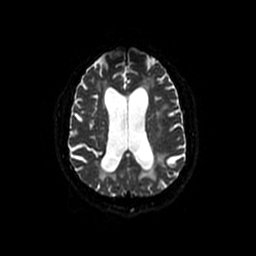
[im 74/106]
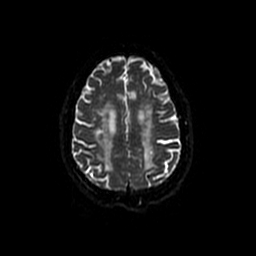
[im 85/106]
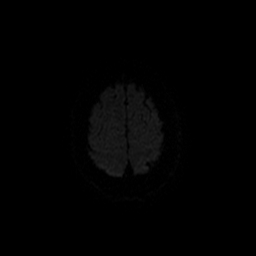
[im 95/106]
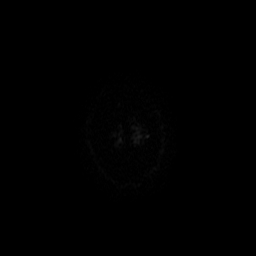
[im 106/106]
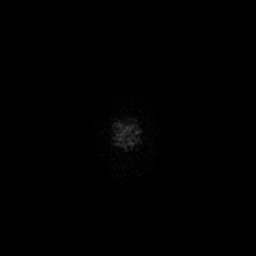

[Series 5: DWI · coronal · 5.0mm · 1.09mm/px · 7 of 76 slices shown (2 of 4)]
[im 1/76]
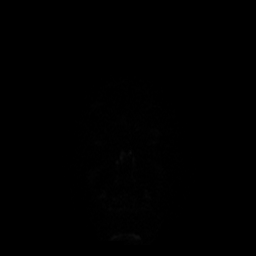
[im 13/76]
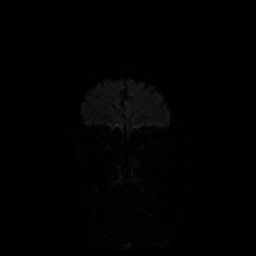
[im 26/76]
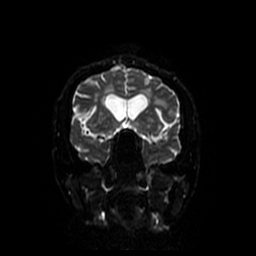
[im 38/76]
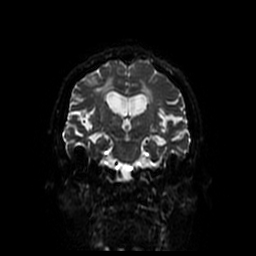
[im 51/76]
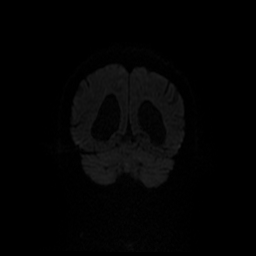
[im 63/76]
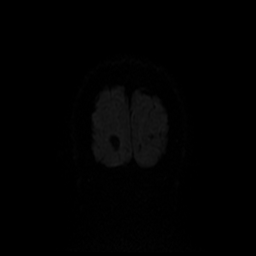
[im 76/76]
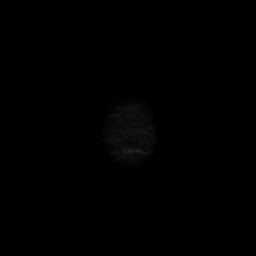

[Series 6: T1 · sagittal · 5.0mm · 0.47mm/px · 2 of 26 slices shown (1 of 2)]
[im 1/26]
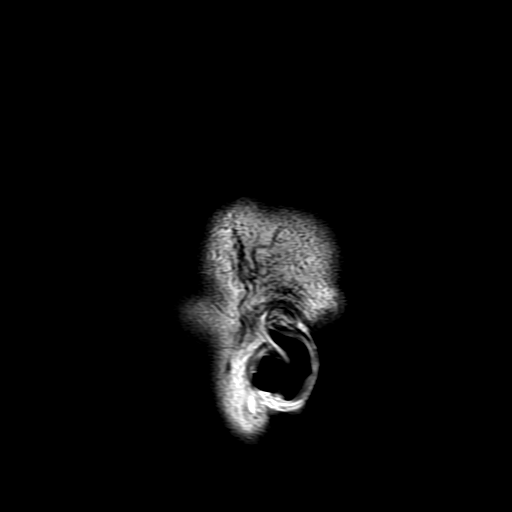
[im 26/26]
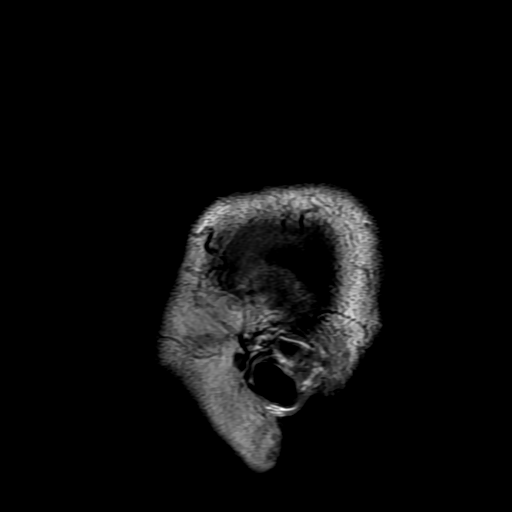

[Series 7: T2 · axial · 5.0mm · 0.43mm/px · z∈[-48,+98]mm · 2 of 26 slices shown (1 of 2)]
[im 1/26]
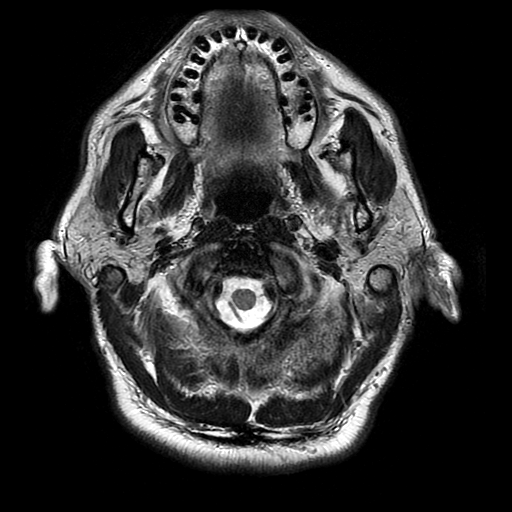
[im 26/26]
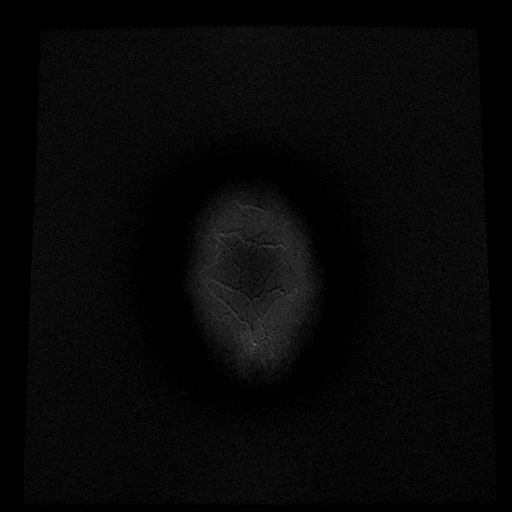

[Series 8: FLAIR · axial · 3.0mm · 0.43mm/px · z∈[-52,+98]mm · 3 of 27 slices shown]
[im 1/27]
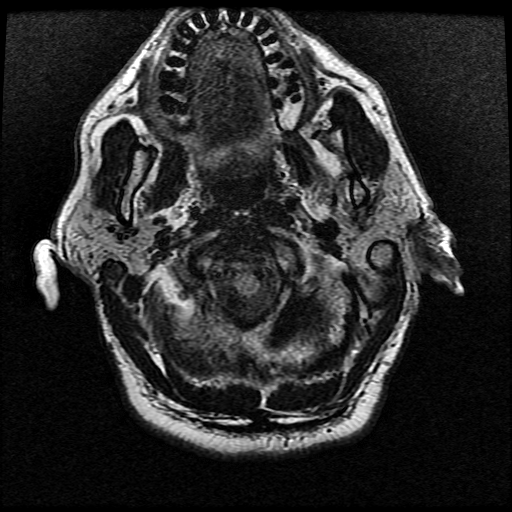
[im 14/27]
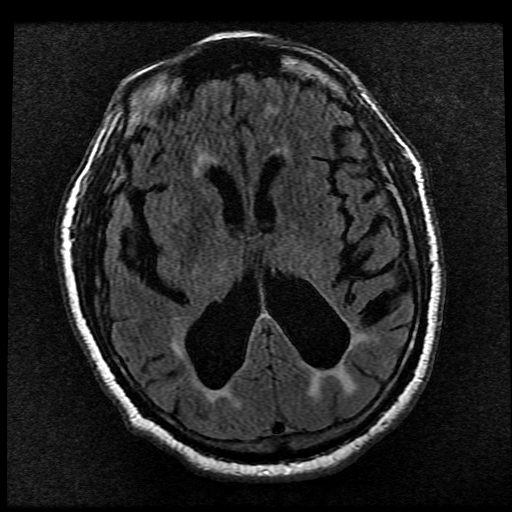
[im 27/27]
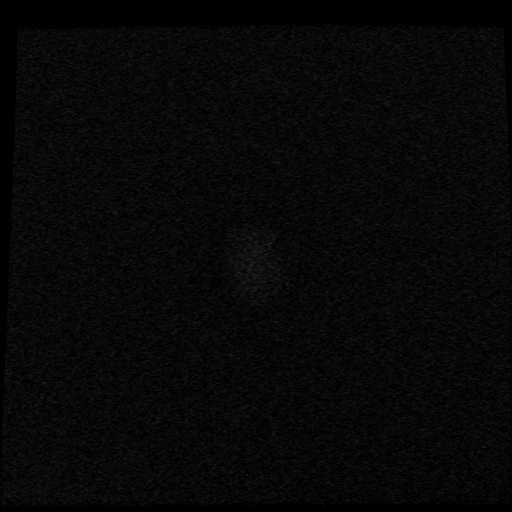

[Series 10: T1 · axial · 3.0mm · 0.47mm/px · 1 of 100 slices shown (2 of 2)]
[im 1/100]
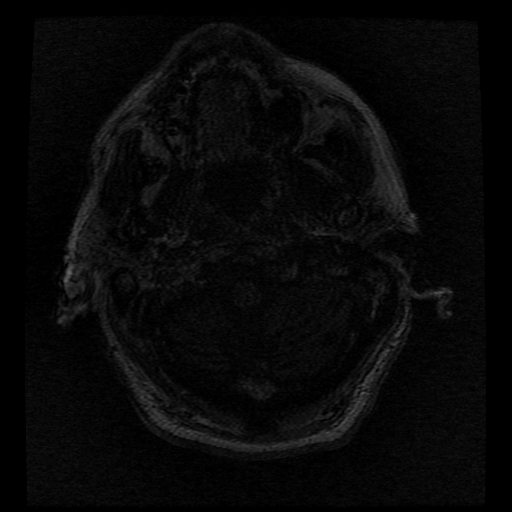

[Series 11: T2 · coronal · 5.0mm · 0.39mm/px · 3 of 30 slices shown (2 of 2)]
[im 1/30]
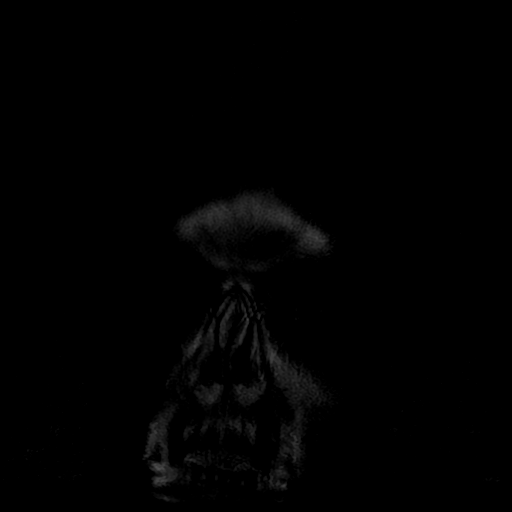
[im 15/30]
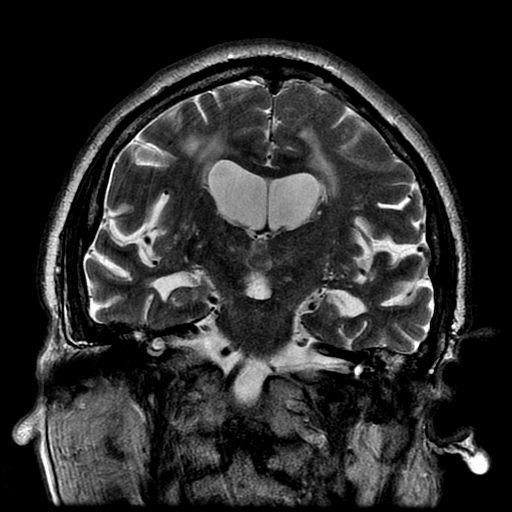
[im 30/30]
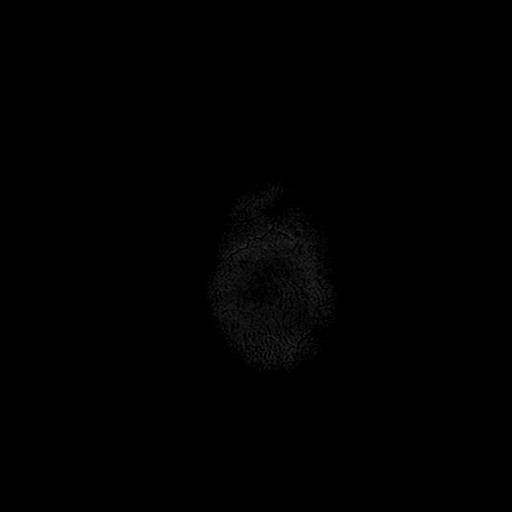

[Series 400: DWI · axial · 3.0mm · 1.09mm/px · z∈[-61,+90]mm · 5 of 53 slices shown (3 of 4)]
[im 1/53]
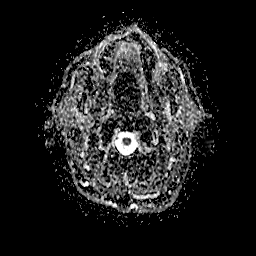
[im 14/53]
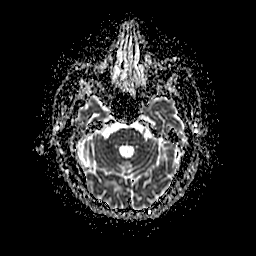
[im 27/53]
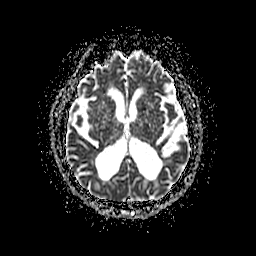
[im 40/53]
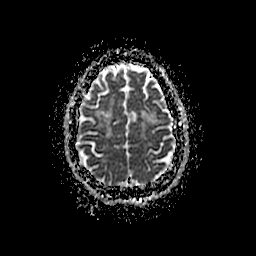
[im 53/53]
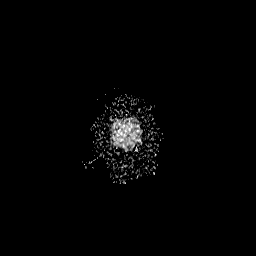

[Series 500: DWI · coronal · 5.0mm · 1.09mm/px · 4 of 38 slices shown (4 of 4)]
[im 1/38]
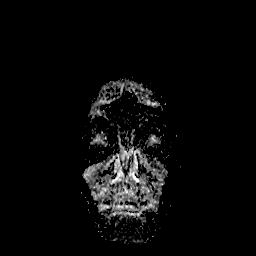
[im 13/38]
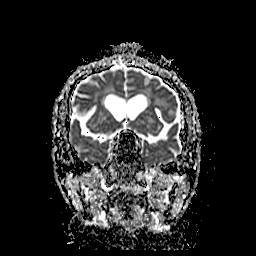
[im 25/38]
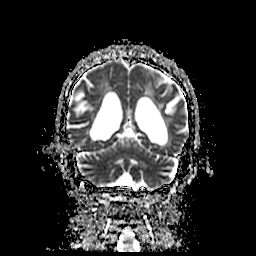
[im 38/38]
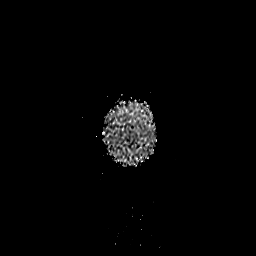

[38 of 48 positions shown; findings below may reference images not displayed]

FINDINGS: Brain: Negative for acute infarct. Moderate chronic microvascular
ischemic change in the white matter bilaterally. Mild chronic
ischemic change in the pons.

Numerous foci of punctate microhemorrhage throughout both cerebral
hemispheres and in the cerebellum bilaterally. No change from the
prior study. No large area of hemorrhage. No mass or edema
identified.

Vascular: Normal arterial flow voids.

Skull and upper cervical spine: No focal skeletal abnormality

Sinuses/Orbits: Mild mucosal edema paranasal sinuses. Left cataract
extraction

Other: None
IMPRESSION: Negative for acute infarct

Atrophy and moderate chronic ischemic change.

Numerous foci of chronic microhemorrhage throughout the cerebellum
and cerebrum bilaterally. This is unchanged from the prior study.
Differential includes uncontrolled hypertension and cerebral
amyloid.

## 2021-12-17 NOTE — Progress Notes (Signed)
Remote pacemaker transmission.   

## 2022-01-07 ENCOUNTER — Telehealth: Payer: Self-pay | Admitting: *Deleted

## 2022-01-07 NOTE — Telephone Encounter (Signed)
  Care Management   Outreach Note  01/07/2022 Name: Dustin Hamilton MRN: [id number] DOB: June 04, 1940  Successful contact was made with the patient to discuss care management and care coordination services. Patient declines engagement at this time.  Followed at [place]:  No further follow up required:    Eduard Clos MSW, LCSW Licensed Clinical Social Worker      [phone]

## 2022-02-03 ENCOUNTER — Ambulatory Visit (INDEPENDENT_AMBULATORY_CARE_PROVIDER_SITE_OTHER): Payer: No Typology Code available for payment source

## 2022-02-03 ENCOUNTER — Ambulatory Visit
Admission: EM | Admit: 2022-02-03 | Discharge: 2022-02-03 | Disposition: A | Discharge status: Home / Self Care | Payer: No Typology Code available for payment source | Attending: Urgent Care | Admitting: Urgent Care

## 2022-02-03 ENCOUNTER — Encounter: Payer: Self-pay | Admitting: Emergency Medicine

## 2022-02-03 DIAGNOSIS — M7022 Olecranon bursitis, left elbow: Secondary | ICD-10-CM | POA: Diagnosis not present

## 2022-02-03 DIAGNOSIS — M25522 Pain in left elbow: Secondary | ICD-10-CM

## 2022-02-03 MED ORDER — ACETAMINOPHEN 325 MG PO TABS: 650.0000 mg | ORAL_TABLET | Freq: Four times a day (QID) | ORAL | 0 refills | Status: DC | PRN

## 2022-02-03 NOTE — ED Triage Notes (Signed)
Pt is present today with left elbow swelling, Pt sx started last Thursday. Pt denies any injury

## 2022-02-03 NOTE — ED Provider Notes (Signed)
[facility]  Note:  This document was prepared using Dragon voice recognition software and may include unintentional dictation errors.  MRN: [id number] DOB: Sep 29, 1940  Subjective:   Dustin Hamilton is a 81 y.o. male presenting for 4-day history of persistent left elbow swelling.  Symptoms started unprovoked.  He did suffer a fall last night and made impact against this elbow.  No redness, warmth, pain, decreased range of motion.  Patient does have to ambulate with a walker and uses his arms to bear some of his weight.   No current facility-administered medications for this encounter.  Current Outpatient Medications:    acetaminophen (TYLENOL) 500 MG tablet, Take 1,000 mg by mouth every 6 (six) hours as needed for fever or mild pain., Disp: , Rfl:    allopurinol (ZYLOPRIM) 100 MG tablet, Take 1 tablet (100 mg total) by mouth daily., Disp: 30 tablet, Rfl: 2   amLODipine (NORVASC) 2.5 MG tablet, Take 2.5 mg by mouth daily., Disp: , Rfl:    aspirin 81 MG EC tablet, Take 1 tablet (81 mg total) by mouth daily. Swallow whole., Disp: 30 tablet, Rfl: 11   atorvastatin (LIPITOR) 40 MG tablet, Take 0.5 tablets (20 mg total) by mouth daily., Disp: 30 tablet, Rfl: 0   brimonidine (ALPHAGAN) 0.2 % ophthalmic solution, Place 1 drop into both eyes 2 (two) times daily., Disp: , Rfl:    colchicine 0.6 MG tablet, Take 1 tablet (0.6 mg total) by mouth daily., Disp: 30 tablet, Rfl: 0   diclofenac Sodium (VOLTAREN) 1 % GEL, Apply 4 g topically 4 (four) times daily. (Patient taking differently: Apply 4 g topically daily as needed (pain).), Disp: 100 g, Rfl: 0   dutasteride (AVODART) 0.5 MG capsule, Take 0.5 mg by mouth daily., Disp: , Rfl:    fidaxomicin (DIFICID) 200 MG TABS tablet, Take 1 tablet (200 mg total) by mouth 2 (two) times daily. (Patient taking differently: Take 200 mg by mouth See admin instructions. Start date : 09/03/21. Take 200 mg 2 times a day for 5 days then take 200 mg every  other day for 20 days.), Disp: 20 tablet, Rfl: 0   latanoprost (XALATAN) 0.005 % ophthalmic solution, Place 1 drop into both eyes at bedtime. , Disp: , Rfl:    lidocaine (LIDODERM) 5 %, Place 1 patch onto the skin daily as needed (back pain). Remove & Discard patch within 12 hours or as directed by MD, Disp: , Rfl:    memantine (NAMENDA) 10 MG tablet, Take 20 mg by mouth at bedtime., Disp: , Rfl:    methocarbamol (ROBAXIN) 500 MG tablet, Take 1 tablet (500 mg total) by mouth 2 (two) times daily. (Patient not taking: Reported on 09/04/2021), Disp: 20 tablet, Rfl: 0   metoprolol succinate (TOPROL-XL) 25 MG 24 hr tablet, Take 0.5 tablets (12.5 mg total) by mouth at bedtime., Disp: 30 tablet, Rfl: 1   Naphazoline HCl (CLEAR EYES OP), Place 2 drops into both eyes daily as needed (dry eyes)., Disp: , Rfl:    Nutritional Supplements ([place] PO), Take 237 mLs by mouth daily as needed (appetite, nutrition). Clear/apple, Disp: , Rfl:    tamsulosin (FLOMAX) 0.4 MG CAPS capsule, Take 0.4 mg by mouth at bedtime., Disp: , Rfl:    tiZANidine (ZANAFLEX) 4 MG tablet, Take 4 mg by mouth 2 (two) times daily as needed for muscle spasms., Disp: , Rfl:    Allergies  Allergen Reactions   Aricept [Donepezil]     dizziness  Levetiracetam Other (See Comments)    AMS   Penicillins Hives   Viagra [Sildenafil]     dizziness    Past Medical History:  Diagnosis Date   BPH (benign prostatic hyperplasia)    Cerebrovascular disease    Clostridium difficile diarrhea    Congenital anomaly of diaphragm    Elevated PSA    Glaucoma, both eyes    Hemorrhoid    Hepatitis B surface antigen positive    02-20-2011   History of adenomatous polyp of colon    2007, 2009 and 2013  tubular adenoma's   History of alcohol abuse    quit 1963   History of cerebral parenchymal hemorrhage    01/ 2006  left occiptial lobe related to hypertensive crisis   History of CVA (cerebrovascular accident)    09-12-2012   left hippocampus/ amygdala junction and per MRI old white matter infarcts--  per pt residual short- term memory issues   History of fatty infiltration of liver hx visit's at [facility] , last visit 05/ 2014   elvated LFT's ,  via liver bx 2004 related to hx alcohol and drug abuse (quit 1964)   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   HTN (hypertension)    Renal cyst, left    Stroke ([place])    hx of 3 strokes in past    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;  Service: Cardiovascular;  Laterality: N/A;   SVT ABLATION N/A 11/15/2018   Procedure: SVT ABLATION;  Surgeon: Evans Lance, MD;  Location: [facility];  Service: Cardiovascular;  Laterality: N/A;   TRANSTHORACIC ECHOCARDIOGRAM  09/13/2012   moderate LVH,  ef 60-65%/     TRANSURETHRAL RESECTION OF BLADDER TUMOR N/A 08/11/2016   Procedure: TRANSURETHRAL RESECTION OF BLADDER TUMOR (TURBT);  Surgeon: Cleon Gustin, MD;  Location: [facility];  Service: Urology;  Laterality: N/A;    Family History  Problem Relation Age of Onset   Rheum arthritis Mother    Diabetes Mother    Stroke Mother    Heart attack Mother    Kidney failure Mother    Heart attack Father    Heart disease Maternal Grandmother    Rheum arthritis Maternal Grandmother     Diabetes Maternal Grandmother    Stroke Maternal Grandmother    Colon cancer Neg Hx     Social History   Tobacco Use   Smoking status: Former    Packs/day: 1.00    Years: 5.00    Total pack years: 5.00    Types: Cigarettes    Quit date: 11/30/1981    Years since quitting: 40.2   Smokeless tobacco: Never  Vaping Use   Vaping Use: Never used  Substance Use Topics  Alcohol use: No    Alcohol/week: 0.0 standard drinks of alcohol    Comment: hx abuse -- quit:  1963   Drug use: No    Comment: hx abuse -- quit 1964 (iv heroin and cocaine    ROS   Objective:   Vitals: BP (!) 152/87   Pulse 71   Temp 98 F (36.7 C)   Resp 18   SpO2 95%   Physical Exam Constitutional:      General: He is not in acute distress.    Appearance: Normal appearance. He is well-developed and normal weight. He is not ill-appearing, toxic-appearing or diaphoretic.  HENT:     Head: Normocephalic and atraumatic.     Right Ear: External ear normal.     Left Ear: External ear normal.     Nose: Nose normal.     Mouth/Throat:     Pharynx: Oropharynx is clear.  Eyes:     General: No scleral icterus.       Right eye: No discharge.        Left eye: No discharge.     Extraocular Movements: Extraocular movements intact.  Cardiovascular:     Rate and Rhythm: Normal rate.  Pulmonary:     Effort: Pulmonary effort is normal.  Musculoskeletal:     Left elbow: Swelling (directly over olecranon bursa) present. No deformity, effusion or lacerations. Normal range of motion. No tenderness.     Cervical back: Normal range of motion.  Neurological:     Mental Status: He is alert and oriented to person, place, and time.  Psychiatric:        Mood and Affect: Mood normal.        Behavior: Behavior normal.        Thought Content: Thought content normal.        Judgment: Judgment normal.     Assessment and Plan :   PDMP not reviewed this encounter.  1. Olecranon bursitis of left elbow    X-ray over-read  was pending at time of discharge, recommended follow up with only abnormal results. Otherwise will not call for negative over-read. Patient was in agreement.  Low suspicion for septic arthritis, cellulitis.  Physical exam is consistent with olecranon bursitis which is likely related to the nature of using his elbow and the walker.  Recommended conservative management, RICE method, Tylenol for pain relief.  I applied a 3 inch Ace wrap to the left elbow and advised that he use it during the day but not in his sleep. Counseled patient on potential for adverse effects with medications prescribed/recommended today, ER and return-to-clinic precautions discussed, patient verbalized understanding.    Jaynee Eagles, [place] 02/03/22 1535

## 2022-02-03 NOTE — Discharge Instructions (Addendum)
I will call and let you know about your test results. Follow up with an orthopedist to see if they could safely drain your elbow bursa as appropriate. Use Tylenol as needed for pain.

## 2022-02-06 DIAGNOSIS — G4733 Obstructive sleep apnea (adult) (pediatric): Secondary | ICD-10-CM | POA: Diagnosis present

## 2022-02-06 DIAGNOSIS — G219 Secondary parkinsonism, unspecified: Secondary | ICD-10-CM | POA: Diagnosis present

## 2022-02-06 DIAGNOSIS — G8929 Other chronic pain: Secondary | ICD-10-CM | POA: Insufficient documentation

## 2022-02-06 DIAGNOSIS — Z950 Presence of cardiac pacemaker: Secondary | ICD-10-CM | POA: Diagnosis present

## 2022-02-06 DIAGNOSIS — E782 Mixed hyperlipidemia: Secondary | ICD-10-CM | POA: Diagnosis present

## 2022-02-07 DIAGNOSIS — I680 Cerebral amyloid angiopathy: Secondary | ICD-10-CM | POA: Diagnosis not present

## 2022-02-07 DIAGNOSIS — G309 Alzheimer's disease, unspecified: Secondary | ICD-10-CM | POA: Diagnosis not present

## 2022-02-07 DIAGNOSIS — E854 Organ-limited amyloidosis: Secondary | ICD-10-CM | POA: Diagnosis not present

## 2022-02-07 DIAGNOSIS — G219 Secondary parkinsonism, unspecified: Secondary | ICD-10-CM | POA: Diagnosis not present

## 2022-02-11 DIAGNOSIS — M25522 Pain in left elbow: Secondary | ICD-10-CM | POA: Diagnosis not present

## 2022-03-09 ENCOUNTER — Other Ambulatory Visit: Payer: Self-pay

## 2022-03-09 ENCOUNTER — Emergency Department (HOSPITAL_BASED_OUTPATIENT_CLINIC_OR_DEPARTMENT_OTHER): Payer: No Typology Code available for payment source

## 2022-03-09 ENCOUNTER — Encounter (HOSPITAL_BASED_OUTPATIENT_CLINIC_OR_DEPARTMENT_OTHER): Payer: Self-pay | Admitting: Emergency Medicine

## 2022-03-09 ENCOUNTER — Emergency Department (HOSPITAL_BASED_OUTPATIENT_CLINIC_OR_DEPARTMENT_OTHER)
Admission: EM | Admit: 2022-03-09 | Discharge: 2022-03-09 | Disposition: A | Discharge status: Home / Self Care | Payer: No Typology Code available for payment source | Attending: Emergency Medicine | Admitting: Emergency Medicine

## 2022-03-09 DIAGNOSIS — N3000 Acute cystitis without hematuria: Secondary | ICD-10-CM

## 2022-03-09 DIAGNOSIS — I10 Essential (primary) hypertension: Secondary | ICD-10-CM | POA: Insufficient documentation

## 2022-03-09 DIAGNOSIS — Z79899 Other long term (current) drug therapy: Secondary | ICD-10-CM | POA: Insufficient documentation

## 2022-03-09 DIAGNOSIS — Z7982 Long term (current) use of aspirin: Secondary | ICD-10-CM | POA: Diagnosis not present

## 2022-03-09 DIAGNOSIS — Z20822 Contact with and (suspected) exposure to covid-19: Secondary | ICD-10-CM | POA: Insufficient documentation

## 2022-03-09 DIAGNOSIS — R4182 Altered mental status, unspecified: Secondary | ICD-10-CM | POA: Diagnosis not present

## 2022-03-09 DIAGNOSIS — R531 Weakness: Secondary | ICD-10-CM

## 2022-03-09 DIAGNOSIS — R42 Dizziness and giddiness: Secondary | ICD-10-CM | POA: Diagnosis present

## 2022-03-09 DIAGNOSIS — R5383 Other fatigue: Secondary | ICD-10-CM | POA: Diagnosis not present

## 2022-03-09 DIAGNOSIS — R059 Cough, unspecified: Secondary | ICD-10-CM | POA: Diagnosis not present

## 2022-03-09 LAB — URINALYSIS, ROUTINE W REFLEX MICROSCOPIC
Bilirubin Urine: NEGATIVE
Glucose, UA: NEGATIVE mg/dL
Hgb urine dipstick: NEGATIVE
Ketones, ur: NEGATIVE mg/dL
Nitrite: NEGATIVE
Protein, ur: 100 mg/dL — AB
Specific Gravity, Urine: 1.025 (ref 1.005–1.030)
pH: 5 (ref 5.0–8.0)

## 2022-03-09 LAB — URINALYSIS, MICROSCOPIC (REFLEX)

## 2022-03-09 LAB — BASIC METABOLIC PANEL
Anion gap: 4 — ABNORMAL LOW (ref 5–15)
BUN: 19 mg/dL (ref 8–23)
CO2: 29 mmol/L (ref 22–32)
Calcium: 9.5 mg/dL (ref 8.9–10.3)
Chloride: 110 mmol/L (ref 98–111)
Creatinine, Ser: 0.92 mg/dL (ref 0.61–1.24)
GFR, Estimated: >60 mL/min (ref >60)
Glucose, Bld: 88 mg/dL (ref 70–99)
Potassium: 3.5 mmol/L (ref 3.5–5.1)
Sodium: 143 mmol/L (ref 135–145)

## 2022-03-09 LAB — CBC
HCT: 46.8 % (ref 39.0–52.0)
Hemoglobin: 15.8 g/dL (ref 13.0–17.0)
MCH: 31.7 pg (ref 26.0–34.0)
MCHC: 33.8 g/dL (ref 30.0–36.0)
MCV: 94 fL (ref 80.0–100.0)
Platelets: 134 10*3/uL — ABNORMAL LOW (ref 150–400)
RBC: 4.98 MIL/uL (ref 4.22–5.81)
RDW: 13.8 % (ref 11.5–15.5)
WBC: 4.3 10*3/uL (ref 4.0–10.5)
nRBC: 0 % (ref 0.0–0.2)

## 2022-03-09 LAB — CBG MONITORING, ED: Glucose-Capillary: 138 mg/dL — ABNORMAL HIGH (ref 70–99)

## 2022-03-09 LAB — RESP PANEL BY RT-PCR (FLU A&B, COVID) ARPGX2
Influenza A by PCR: NEGATIVE
Influenza B by PCR: NEGATIVE
SARS Coronavirus 2 by RT PCR: NEGATIVE

## 2022-03-09 LAB — TROPONIN I (HIGH SENSITIVITY)
Troponin I (High Sensitivity): 9 ng/L (ref <18)
Troponin I (High Sensitivity): 11 ng/L (ref <18)

## 2022-03-09 MED ORDER — CEFDINIR 300 MG PO CAPS: 300.0000 mg | ORAL_CAPSULE | Freq: Two times a day (BID) | ORAL | 0 refills | Status: AC

## 2022-03-09 MED ORDER — SODIUM CHLORIDE 0.9 % IV SOLN
1.0000 g | INTRAVENOUS | Status: AC
  Administered 2022-03-09: 1 g via INTRAVENOUS
  Filled 2022-03-09: qty 10

## 2022-03-09 NOTE — ED Triage Notes (Signed)
Around 6 pm yesterday patient wife states that he had a unsteady gait. States that he has a headache today.States blood pressure was elevated yesterday.denies any slurred speech. States that smile is same as normal 140/92 this am at home. Alert x4  Denies any numbness or tingling in his extremities.Patient has a Psychologist, forensic.strong hand grips

## 2022-03-09 NOTE — ED Provider Notes (Signed)
[facility] EMERGENCY DEPARTMENT Provider Note   CSN: [id number] Arrival date & time: 03/09/22  1012     History  Chief Complaint  Patient presents with   Dizziness    Dustin Hamilton is a 81 y.o. male.  81 year old male with a history of dementia, stroke without residual deficits, pacemaker for arrhythmia, and hypertension who presents to the emergency department with generalized weakness for a week.  Patient's wife states that he lives at home with her and gets help from several health aides.  Reports that he has been becoming more fatigued and less active over the past week.  Has been too weak to work with physical therapy recently.  Also reports that he has had a decreased appetite.  Says that he has a small cough but no other symptoms at this time aside from his chronic neck pain.  Says that rivastigmine was added for his dementia recently and they think that it could be related to starting this medication. No recent infections.   Of note, in triage it was documented that he was complaining of dizziness but I clarified this with the patient and his wife and they stated it is not dizziness but weakness he has been having.   Past Medical History:  Diagnosis Date   BPH (benign prostatic hyperplasia)    Cerebrovascular disease    Clostridium difficile diarrhea    Congenital anomaly of diaphragm    Elevated PSA    Glaucoma, both eyes    Hemorrhoid    Hepatitis B surface antigen positive    02-20-2011   History of adenomatous polyp of colon    2007, 2009 and 2013  tubular adenoma's   History of alcohol abuse    quit 1963   History of cerebral parenchymal hemorrhage    01/ 2006  left occiptial lobe related to hypertensive crisis   History of CVA (cerebrovascular accident)    09-12-2012  left hippocampus/ amygdala junction and per MRI old white matter infarcts--  per pt residual short- term memory issues   History of fatty infiltration of liver hx visit's at [facility] , last visit 05/ 2014   elvated LFT's ,  via liver bx 2004 related to hx alcohol and drug abuse (quit 1964)   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 Authorizing Provider  cefdinir (OMNICEF) 300 MG capsule Take 1 capsule (300 mg total) by mouth 2 (two) times daily for 7 days. 03/09/22 03/16/22 Yes Fransico Meadow, MD  acetaminophen (TYLENOL) 325 MG tablet Take 2 tablets (650 mg total) by mouth every 6 (six) hours as needed for moderate pain. 02/03/22   Jaynee Eagles, PA-C  allopurinol (ZYLOPRIM) 100 MG tablet Take 1 tablet (100 mg total) by mouth daily. 04/05/21 04/05/22  Pokhrel, Corrie Mckusick, MD  amLODipine (NORVASC) 2.5 MG tablet Take 2.5 mg by mouth daily.    [provider]  aspirin 81 MG EC tablet Take 1 tablet (81 mg total) by mouth daily. Swallow whole. 09/06/21   Raiford Noble Latif, DO  atorvastatin (LIPITOR)  40 MG tablet Take 0.5 tablets (20 mg total) by mouth daily. 09/06/21   Raiford Noble Latif, DO  brimonidine (ALPHAGAN) 0.2 % ophthalmic solution Place 1 drop into both eyes 2 (two) times daily.    [provider]  colchicine 0.6 MG tablet Take 1 tablet (0.6 mg total) by mouth daily. 08/25/21   Horton, Alvin Critchley, DO  diclofenac Sodium (VOLTAREN) 1 % GEL Apply 4 g topically 4 (four) times daily. Patient taking differently: Apply 4 g topically daily as needed (pain). 06/05/21   Deno Etienne, DO  dutasteride (AVODART) 0.5 MG capsule Take 0.5 mg by mouth daily.    [provider]  fidaxomicin (DIFICID) 200 MG TABS tablet Take 1 tablet (200 mg total) by mouth 2 (two) times daily. Patient taking differently: Take 200 mg by mouth See admin  instructions. Start date : 09/03/21. Take 200 mg 2 times a day for 5 days then take 200 mg every other day for 20 days. 07/29/21   Kommor, Madison, MD  latanoprost (XALATAN) 0.005 % ophthalmic solution Place 1 drop into both eyes at bedtime.     [provider]  lidocaine (LIDODERM) 5 % Place 1 patch onto the skin daily as needed (back pain). Remove & Discard patch within 12 hours or as directed by MD    [provider]  memantine (NAMENDA) 10 MG tablet Take 20 mg by mouth at bedtime.    [provider]  methocarbamol (ROBAXIN) 500 MG tablet Take 1 tablet (500 mg total) by mouth 2 (two) times daily. Patient not taking: Reported on 09/04/2021 06/05/21   Deno Etienne, DO  metoprolol succinate (TOPROL-XL) 25 MG 24 hr tablet Take 0.5 tablets (12.5 mg total) by mouth at bedtime. 05/31/20   Lilland, Alana, DO  Naphazoline HCl (CLEAR EYES OP) Place 2 drops into both eyes daily as needed (dry eyes).    [provider]  Nutritional Supplements ([facility] [place] PO) Take 237 mLs by mouth daily as needed (appetite, nutrition). Clear/apple    [provider]  tamsulosin (FLOMAX) 0.4 MG CAPS capsule Take 0.4 mg by mouth at bedtime.    [provider]  tiZANidine (ZANAFLEX) 4 MG tablet Take 4 mg by mouth 2 (two) times daily as needed for muscle spasms. 06/11/21   [provider]      Allergies    Aricept [donepezil], Levetiracetam, Penicillins, and Viagra [sildenafil]    Review of Systems   Review of Systems  Physical Exam Updated Vital Signs BP (!) 177/97   Pulse (!) 59   Temp 98.7 F (37.1 C) (Oral)   Resp 17   SpO2 93%  Physical Exam Vitals and nursing note reviewed.  Constitutional:      General: He is not in acute distress.    Appearance: He is well-developed.  HENT:     Head: Normocephalic and atraumatic.     Right Ear: External ear normal.     Left Ear: External ear normal.     Nose: Nose normal.  Eyes:     Extraocular  Movements: Extraocular movements intact.     Conjunctiva/sclera: Conjunctivae normal.     Pupils: Pupils are equal, round, and reactive to light.     Comments: Pupils 60m BL  Cardiovascular:     Rate and Rhythm: Normal rate and regular rhythm.     Heart sounds: Normal heart sounds.  Pulmonary:     Effort: Pulmonary effort is normal. No respiratory distress.  Breath sounds: Normal breath sounds.  Abdominal:     General: There is no distension.     Palpations: Abdomen is soft. There is no mass.     Tenderness: There is no abdominal tenderness. There is no guarding.  Musculoskeletal:        General: No swelling.     Cervical back: Normal range of motion and neck supple.     Right lower leg: No edema.     Left lower leg: No edema.  Skin:    General: Skin is warm and dry.     Capillary Refill: Capillary refill takes less than 2 seconds.  Neurological:     Mental Status: He is alert. Mental status is at baseline.     Comments: Alert and oriented to self and place.  Thought that it was 2022.  Wife says this is typical for him.  CN 2-12 intact. Full strength of bilateral upper and lower extremities.  Intact sensation to light touch of all 4 extremities.  Psychiatric:        Mood and Affect: Mood normal.        Behavior: Behavior normal.     ED Results / Procedures / Treatments   Labs (all labs ordered are listed, but only abnormal results are displayed) Labs Reviewed  BASIC METABOLIC PANEL - Abnormal; Notable for the following components:      Result Value   Anion gap 4 (*)    All other components within normal limits  CBC - Abnormal; Notable for the following components:   Platelets 134 (*)    All other components within normal limits  URINALYSIS, ROUTINE W REFLEX MICROSCOPIC - Abnormal; Notable for the following components:   Protein, ur 100 (*)    Leukocytes,Ua SMALL (*)    All other components within normal limits  URINALYSIS, MICROSCOPIC (REFLEX) - Abnormal; Notable for  the following components:   Bacteria, UA FEW (*)    All other components within normal limits  CBG MONITORING, ED - Abnormal; Notable for the following components:   Glucose-Capillary 138 (*)    All other components within normal limits  RESP PANEL BY RT-PCR (FLU A&B, COVID) ARPGX2  URINE CULTURE  TROPONIN I (HIGH SENSITIVITY)  TROPONIN I (HIGH SENSITIVITY)    EKG EKG Interpretation  Date/Time:  Sunday March 09 2022 10:32:47 EDT Ventricular Rate:  66 PR Interval:  360 QRS Duration: 132 QT Interval:  413 QTC Calculation: 433 R Axis:   -71 Text Interpretation: Sinus rhythm Prolonged PR interval Nonspecific IVCD with LAD Anteroseptal infarct, age indeterminate No significant change since last tracing Confirmed by Margaretmary Eddy [phone]) on 03/09/2022 12:49:35 PM  Radiology DG Chest 2 View  Result Date: 03/09/2022 CLINICAL DATA:  Cough and fatigue. EXAM: CHEST - 2 VIEW COMPARISON:  None Available. FINDINGS: The heart size and mediastinal contours are within normal limits. Permanent pacemaker remains in appropriate position. Stable elevation of right hemidiaphragm is seen. Both lungs are clear. The visualized skeletal structures are unremarkable. IMPRESSION: Stable exam. No active cardiopulmonary disease. Electronically Signed   By: Marlaine Hind M.D.   On: 03/09/2022 11:39   CT HEAD WO CONTRAST  Result Date: 03/09/2022 CLINICAL DATA:  Altered mental status beginning yesterday. Unsteady gait. Hypertension. EXAM: CT HEAD WITHOUT CONTRAST TECHNIQUE: Contiguous axial images were obtained from the base of the skull through the vertex without intravenous contrast. RADIATION DOSE REDUCTION: This exam was performed according to the departmental dose-optimization program which includes automated exposure control, adjustment of the  mA and/or kV according to patient size and/or use of iterative reconstruction technique. COMPARISON:  09/27/2021 FINDINGS: Brain: No evidence of intracranial hemorrhage,  acute infarction, acute hydrocephalus, extra-axial collection, or mass lesion/mass effect. Stable cerebral atrophy and prominent ventriculomegaly are seen. Advanced chronic small vessel disease is also unchanged in appearance. Vascular:  No hyperdense vessel or other acute findings. Skull: No evidence of fracture or other significant bone abnormality. Sinuses/Orbits:  No acute findings. Other: None. IMPRESSION: No acute intracranial abnormality. Stable cerebral atrophy, ventriculomegaly, and advanced chronic small vessel disease. Electronically Signed   By: Marlaine Hind M.D.   On: 03/09/2022 11:38    Procedures Procedures   Medications Ordered in ED Medications  cefTRIAXone (ROCEPHIN) 1 g in sodium chloride 0.9 % 100 mL IVPB (0 g Intravenous Stopped 03/09/22 1429)    ED Course/ Medical Decision Making/ A&P Clinical Course as of 03/10/22 0735  Sun Mar 09, 2022  1133 CT head reviewed and interpreted by me as showing no acute intracranial abnormality.  Chest x-ray reviewed and interpreted by me as showing pacemaker leads presents with elevated right hemidiaphragm but no acute changes. [RP]    Clinical Course User Index [RP] Fransico Meadow, MD                           Medical Decision Making Amount and/or Complexity of Data Reviewed Labs: ordered. Radiology: ordered.  Risk Prescription drug management.   Dustin Hamilton is a 81 y.o. male with comorbidities that complicate the patient evaluation including dementia, stroke without residual deficits, pacemaker for arrhythmia, and hypertension who presents with chief complaint of weakness.  This patient presents to the ED for concern of complaints listed in HPI, this involves an extensive number of treatment options, and is a complaint that carries with it a high risk of complications and morbidity.   Initial Ddx:  Polypharmacy, infection such as UTI, ICH, dehydration/electrolyte abnormalities, MI  MDM:  Feel the patient is likely  having polypharmacy from his recently added rivastigmine.  We will also initiate broad infectious work-up that includes testing for urinary and pulmonary sources.  Considered ICH and stroke but feel this is much less likely as he does not have any focal neurologic deficits on exam but will obtain head CT at this time to ensure that there is no bleed.  Given his history of stroke and risk factors also at risk for MI that can cause generalized weakness.  Plan:  Labs UA COVID/flu Troponin CT head Chest x-ray  ED Summary:  Patient underwent the above work-up which showed that he may have a urinary tract infection.  The remainder of his work-up was unremarkable including EKG and serial troponins.  Patient was given a dose of IV ceftriaxone in the emergency department for his UTI and urine was sent off for culture.  Patient's wife was counseled to talk to his primary doctor about discontinuing the rivastigmine as I feel that this could still be playing a role in his symptoms.  They were informed that they should follow-up with her primary doctor in 2 to 3 days and were given a prescription of Omnicef to take at home for his UTI.  Dispo: DC Home. Return precautions discussed including, but not limited to, those listed in the AVS. Allowed pt time to ask questions which were answered fully prior to dc.   Additional history obtained from family Records reviewed Outpatient Clinic Notes and Admission Notes The following labs  were independently interpreted: Chemistry and CBC and show no acute abnormality I independently reviewed the following imaging with scope of interpretation limited to determining acute life threatening conditions related to emergency care: Chest x-ray and CT Head, which revealed no acute abnormality  I personally reviewed and interpreted cardiac monitoring: normal sinus rhythm  I personally reviewed and interpreted the pt's EKG: see above for interpretation  I have reviewed the  patients home medications and made adjustments as needed  Final Clinical Impression(s) / ED Diagnoses Final diagnoses:  Weakness  Acute cystitis without hematuria    Rx / DC Orders ED Discharge Orders          Ordered    cefdinir (OMNICEF) 300 MG capsule  2 times daily        03/09/22 1600              Fransico Meadow, MD 03/10/22 [phone]

## 2022-03-09 NOTE — ED Notes (Signed)
Spoke with Camilla in lab to add on urine culture

## 2022-03-09 NOTE — ED Notes (Signed)
Rn called [employer] to get patients pacemaker intaregated @ 1156.

## 2022-03-09 NOTE — Discharge Instructions (Addendum)
Today you were seen in the emergency department for your weakness.    In the emergency department you were found to have a urinary tract infection.  This could be related to your new medication as well so please talk to your primary doctor about this.    At home, please take the antibiotics that we prescribed to treat your infection.  Check your MyChart online for the results of any tests that had not resulted by the time you left the emergency department.   Follow-up with your primary doctor in 2-3 days regarding your visit.    Return immediately to the emergency department if you experience any of the following: chest pain, shortness, or any other concerning symptoms.    Thank you for visiting our Emergency Department. It was a pleasure taking care of you today.

## 2022-03-10 LAB — URINE CULTURE: Culture: NO GROWTH

## 2022-03-10 MED ORDER — CEFDINIR 300 MG PO CAPS: 300.0000 mg | ORAL_CAPSULE | Freq: Two times a day (BID) | ORAL | 0 refills | Status: DC

## 2022-03-10 NOTE — ED Notes (Cosign Needed)
Patient requesting pharmacy change Omnicef '300mg'$  bid X7 DAYS sent to [place], [place], PA-C 03/10/22 1550

## 2022-04-08 DIAGNOSIS — N401 Enlarged prostate with lower urinary tract symptoms: Secondary | ICD-10-CM | POA: Diagnosis not present

## 2022-04-08 DIAGNOSIS — R310 Gross hematuria: Secondary | ICD-10-CM | POA: Diagnosis not present

## 2022-04-08 DIAGNOSIS — R351 Nocturia: Secondary | ICD-10-CM | POA: Diagnosis not present

## 2022-04-12 ENCOUNTER — Other Ambulatory Visit: Payer: Self-pay

## 2022-04-12 ENCOUNTER — Emergency Department (HOSPITAL_COMMUNITY)
Admission: EM | Admit: 2022-04-12 | Discharge: 2022-04-12 | Discharge status: Against Medical Advice | Payer: No Typology Code available for payment source | Attending: Emergency Medicine | Admitting: Emergency Medicine

## 2022-04-12 ENCOUNTER — Encounter (HOSPITAL_COMMUNITY): Payer: Self-pay

## 2022-04-12 DIAGNOSIS — N4889 Other specified disorders of penis: Secondary | ICD-10-CM | POA: Insufficient documentation

## 2022-04-12 DIAGNOSIS — Z5321 Procedure and treatment not carried out due to patient leaving prior to being seen by health care provider: Secondary | ICD-10-CM | POA: Diagnosis not present

## 2022-04-12 DIAGNOSIS — R319 Hematuria, unspecified: Secondary | ICD-10-CM | POA: Insufficient documentation

## 2022-04-12 LAB — BASIC METABOLIC PANEL
Anion gap: 11 (ref 5–15)
BUN: 18 mg/dL (ref 8–23)
CO2: 27 mmol/L (ref 22–32)
Calcium: 9.8 mg/dL (ref 8.9–10.3)
Chloride: 103 mmol/L (ref 98–111)
Creatinine, Ser: 0.95 mg/dL (ref 0.61–1.24)
GFR, Estimated: >60 mL/min (ref >60)
Glucose, Bld: 85 mg/dL (ref 70–99)
Potassium: 3.8 mmol/L (ref 3.5–5.1)
Sodium: 141 mmol/L (ref 135–145)

## 2022-04-12 LAB — CBC
HCT: 48.9 % (ref 39.0–52.0)
Hemoglobin: 16.3 g/dL (ref 13.0–17.0)
MCH: 32 pg (ref 26.0–34.0)
MCHC: 33.3 g/dL (ref 30.0–36.0)
MCV: 96.1 fL (ref 80.0–100.0)
Platelets: 125 10*3/uL — ABNORMAL LOW (ref 150–400)
RBC: 5.09 MIL/uL (ref 4.22–5.81)
RDW: 13.7 % (ref 11.5–15.5)
WBC: 4.4 10*3/uL (ref 4.0–10.5)
nRBC: 0 % (ref 0.0–0.2)

## 2022-04-12 LAB — TYPE AND SCREEN
ABO/RH(D): A POS
Antibody Screen: NEGATIVE

## 2022-04-12 NOTE — ED Notes (Signed)
Pts family member states they are leaving

## 2022-04-12 NOTE — ED Provider Triage Note (Signed)
Emergency Medicine Provider Triage Evaluation Note  Dustin Hamilton , a 81 y.o. male  was evaluated in triage.  Pt complains of hematuria.  Patient reports that prior to arrival he had 1 episode of gross blood per urine.  The patient denies any pain during this event.  Patient has history of BPH.  Patient reports this occurred 1 time, no history of the same.  Patient denies any fevers, nausea or vomiting, abdominal pain.  Review of Systems  Positive:  Negative:   Physical Exam  BP (!) 156/97 (BP Location: Right Arm)   Pulse 64   Temp 97.7 F (36.5 C)   Resp 16   Ht '5\' 11"'$  (1.803 m)   Wt 69.9 kg   SpO2 96%   BMI 21.48 kg/m  Gen:   Awake, no distress   Resp:  Normal effort  MSK:   Moves extremities without difficulty  Other:    Medical Decision Making  Medically screening exam initiated at 4:32 PM.  Appropriate orders placed.  [name] was informed that the remainder of the evaluation will be completed by another provider, this initial triage assessment does not replace that evaluation, and the importance of remaining in the ED until their evaluation is complete.     Azucena Cecil, PA-C 04/12/22 1633

## 2022-04-12 NOTE — ED Triage Notes (Signed)
Pt with hx of BPH presents today with c/o bleeding from his penis that started today. Denies trouble urinating.

## 2022-04-13 ENCOUNTER — Encounter (HOSPITAL_BASED_OUTPATIENT_CLINIC_OR_DEPARTMENT_OTHER): Payer: Self-pay

## 2022-04-13 ENCOUNTER — Emergency Department (HOSPITAL_BASED_OUTPATIENT_CLINIC_OR_DEPARTMENT_OTHER)
Admission: EM | Admit: 2022-04-13 | Discharge: 2022-04-13 | Disposition: A | Discharge status: Home / Self Care | Payer: Medicare PPO | Attending: Emergency Medicine | Admitting: Emergency Medicine

## 2022-04-13 ENCOUNTER — Emergency Department (HOSPITAL_BASED_OUTPATIENT_CLINIC_OR_DEPARTMENT_OTHER): Payer: Medicare PPO | Admitting: Radiology

## 2022-04-13 DIAGNOSIS — E860 Dehydration: Secondary | ICD-10-CM | POA: Insufficient documentation

## 2022-04-13 DIAGNOSIS — Z7982 Long term (current) use of aspirin: Secondary | ICD-10-CM | POA: Diagnosis not present

## 2022-04-13 DIAGNOSIS — F0390 Unspecified dementia without behavioral disturbance: Secondary | ICD-10-CM | POA: Insufficient documentation

## 2022-04-13 DIAGNOSIS — G20C Parkinsonism, unspecified: Secondary | ICD-10-CM | POA: Insufficient documentation

## 2022-04-13 DIAGNOSIS — R319 Hematuria, unspecified: Secondary | ICD-10-CM | POA: Insufficient documentation

## 2022-04-13 DIAGNOSIS — N390 Urinary tract infection, site not specified: Secondary | ICD-10-CM | POA: Diagnosis not present

## 2022-04-13 DIAGNOSIS — R410 Disorientation, unspecified: Secondary | ICD-10-CM | POA: Insufficient documentation

## 2022-04-13 DIAGNOSIS — Z20822 Contact with and (suspected) exposure to covid-19: Secondary | ICD-10-CM | POA: Insufficient documentation

## 2022-04-13 DIAGNOSIS — I959 Hypotension, unspecified: Secondary | ICD-10-CM | POA: Diagnosis not present

## 2022-04-13 DIAGNOSIS — R531 Weakness: Secondary | ICD-10-CM | POA: Diagnosis present

## 2022-04-13 LAB — CBC WITH DIFFERENTIAL/PLATELET
Abs Immature Granulocytes: 0.01 10*3/uL (ref 0.00–0.07)
Basophils Absolute: 0 10*3/uL (ref 0.0–0.1)
Basophils Relative: 0 %
Eosinophils Absolute: 0.1 10*3/uL (ref 0.0–0.5)
Eosinophils Relative: 2 %
HCT: 47.9 % (ref 39.0–52.0)
Hemoglobin: 16.3 g/dL (ref 13.0–17.0)
Immature Granulocytes: 0 %
Lymphocytes Relative: 21 %
Lymphs Abs: 1.1 10*3/uL (ref 0.7–4.0)
MCH: 31.8 pg (ref 26.0–34.0)
MCHC: 34 g/dL (ref 30.0–36.0)
MCV: 93.6 fL (ref 80.0–100.0)
Monocytes Absolute: 0.3 10*3/uL (ref 0.1–1.0)
Monocytes Relative: 5 %
Neutro Abs: 3.5 10*3/uL (ref 1.7–7.7)
Neutrophils Relative %: 72 %
Platelets: 143 10*3/uL — ABNORMAL LOW (ref 150–400)
RBC: 5.12 MIL/uL (ref 4.22–5.81)
RDW: 13.6 % (ref 11.5–15.5)
WBC: 5 10*3/uL (ref 4.0–10.5)
nRBC: 0 % (ref 0.0–0.2)

## 2022-04-13 LAB — COMPREHENSIVE METABOLIC PANEL
ALT: 14 U/L (ref 0–44)
AST: 16 U/L (ref 15–41)
Albumin: 4 g/dL (ref 3.5–5.0)
Alkaline Phosphatase: 58 U/L (ref 38–126)
Anion gap: 8 (ref 5–15)
BUN: 22 mg/dL (ref 8–23)
CO2: 27 mmol/L (ref 22–32)
Calcium: 9.9 mg/dL (ref 8.9–10.3)
Chloride: 106 mmol/L (ref 98–111)
Creatinine, Ser: 1.15 mg/dL (ref 0.61–1.24)
GFR, Estimated: >60 mL/min (ref >60)
Glucose, Bld: 124 mg/dL — ABNORMAL HIGH (ref 70–99)
Potassium: 3.7 mmol/L (ref 3.5–5.1)
Sodium: 141 mmol/L (ref 135–145)
Total Bilirubin: 0.4 mg/dL (ref 0.3–1.2)
Total Protein: 7.3 g/dL (ref 6.5–8.1)

## 2022-04-13 LAB — URINALYSIS, ROUTINE W REFLEX MICROSCOPIC
Bilirubin Urine: NEGATIVE
Glucose, UA: NEGATIVE mg/dL
Ketones, ur: NEGATIVE mg/dL
Nitrite: NEGATIVE
Protein, ur: NEGATIVE mg/dL
Specific Gravity, Urine: 1.01 (ref 1.005–1.030)
pH: 5.5 (ref 5.0–8.0)

## 2022-04-13 LAB — PROTIME-INR
INR: 1.1 (ref 0.8–1.2)
Prothrombin Time: 13.9 seconds (ref 11.4–15.2)

## 2022-04-13 LAB — RESP PANEL BY RT-PCR (FLU A&B, COVID) ARPGX2
Influenza A by PCR: NEGATIVE
Influenza B by PCR: NEGATIVE
SARS Coronavirus 2 by RT PCR: NEGATIVE

## 2022-04-13 LAB — LACTIC ACID, PLASMA
Lactic Acid, Venous: 2 mmol/L (ref 0.5–1.9)
Lactic Acid, Venous: 1.5 mmol/L (ref 0.5–1.9)

## 2022-04-13 MED ORDER — CIPROFLOXACIN HCL 500 MG PO TABS
500.0000 mg | ORAL_TABLET | Freq: Once | ORAL | Status: AC
  Administered 2022-04-13: 500 mg via ORAL
  Filled 2022-04-13: qty 1

## 2022-04-13 MED ORDER — SODIUM CHLORIDE 0.9 % IV BOLUS
2000.0000 mL | Freq: Once | INTRAVENOUS | Status: AC
  Administered 2022-04-13: 2000 mL via INTRAVENOUS

## 2022-04-13 MED ORDER — CIPROFLOXACIN HCL 500 MG PO TABS: 500.0000 mg | ORAL_TABLET | Freq: Two times a day (BID) | ORAL | 0 refills | Status: AC

## 2022-04-13 NOTE — ED Triage Notes (Signed)
63 YOM in triage, found to have BP of 54 systolic sitting in wheelchair. Presents with confusion this am and blood in urine with history of BPH. Currently taking antibiotic for UTI. Was at [facility] ED yesterday, blood work taken but left before seeing MD due to long wait. Wife with patient.

## 2022-04-13 NOTE — ED Notes (Signed)
Called to add urine culture, spoke with [place].

## 2022-04-13 NOTE — ED Provider Notes (Signed)
[facility] EMERGENCY DEPT Provider Note   CSN: [id number] Arrival date & time: 04/13/22  0915     History  Chief Complaint  Patient presents with   Hematuria   Hypotension   Altered Mental Status    Dustin Hamilton is a 81 y.o. male presenting to the ED with weakness, hypotension, confusion.  He has dementia at baseline but lives with his wife.  She reports he started keflex 5 days ago for hematuria, diagnosed with UTI by urologist Dr Gilford Rile on Wednesday.  Urine has been intermittently more bloody but nonpainful.  They went ot [facility] yesterday but LWBS due to long waiting times.  They return this morning because his BP is "low" and he "doesn't seem right, keeps saying he doesn't feel right," according to his wife.  The patient himself is no acute complaints on asking whether he has chest pain, headache, feels nauseous, or abdominal pain.  He says he feels fine.  His wife reports he did give him his morning blood pressure medicine.  Typically he is hypertensive, not hypotensive.  He does a poor job drinking fluids at home however.  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  He is not on any blood thinning medications, aside from 81 mg aspirin.  HPI     Home Medications Prior to Admission medications   Medication Sig Start Date End Date Taking? Authorizing Provider  ciprofloxacin (CIPRO) 500 MG tablet Take 1 tablet (500 mg total) by mouth every 12 (twelve) hours for 7 days. 04/13/22 04/20/22 Yes Susano Cleckler, Carola Rhine, MD  acetaminophen (TYLENOL) 325 MG tablet Take 2 tablets (650 mg total) by mouth every 6 (six) hours as needed for moderate pain. 02/03/22   Jaynee Eagles, PA-C  allopurinol (ZYLOPRIM) 100 MG tablet Take 1 tablet (100 mg total) by mouth daily. 04/05/21 04/05/22  Pokhrel, Corrie Mckusick, MD  amLODipine (NORVASC) 2.5 MG tablet Take 2.5 mg by mouth daily.    [provider]  aspirin 81 MG EC tablet Take 1 tablet (81 mg total) by mouth  daily. Swallow whole. 09/06/21   Raiford Noble Latif, DO  atorvastatin (LIPITOR) 40 MG tablet Take 0.5 tablets (20 mg total) by mouth daily. 09/06/21   Raiford Noble Latif, DO  brimonidine (ALPHAGAN) 0.2 % ophthalmic solution Place 1 drop into both eyes 2 (two) times daily.    [provider]  cefdinir (OMNICEF) 300 MG capsule Take 1 capsule (300 mg total) by mouth 2 (two) times daily. 03/10/22   Margarita Mail, PA-C  colchicine 0.6 MG tablet Take 1 tablet (0.6 mg total) by mouth daily. 08/25/21   Horton, Alvin Critchley, DO  diclofenac Sodium (VOLTAREN) 1 % GEL Apply 4 g topically 4 (four) times daily. Patient taking differently: Apply 4 g topically daily as needed (pain). 06/05/21   Deno Etienne, DO  dutasteride (AVODART) 0.5 MG capsule Take 0.5 mg by mouth daily.    [provider]  fidaxomicin (DIFICID) 200 MG TABS tablet Take 1 tablet (200 mg total) by mouth 2 (two) times daily. Patient taking differently: Take 200 mg by mouth See admin instructions. Start date : 09/03/21. Take 200 mg 2 times a day for 5 days then take 200 mg every other day for 20 days. 07/29/21   Kommor, Madison, MD  latanoprost (XALATAN) 0.005 % ophthalmic solution Place 1 drop into both eyes at bedtime.     [provider]  lidocaine (LIDODERM) 5 % Place 1 patch onto the skin daily as needed (  back pain). Remove & Discard patch within 12 hours or as directed by MD    [provider]  memantine (NAMENDA) 10 MG tablet Take 20 mg by mouth at bedtime.    [provider]  methocarbamol (ROBAXIN) 500 MG tablet Take 1 tablet (500 mg total) by mouth 2 (two) times daily. Patient not taking: Reported on 09/04/2021 06/05/21   Deno Etienne, DO  metoprolol succinate (TOPROL-XL) 25 MG 24 hr tablet Take 0.5 tablets (12.5 mg total) by mouth at bedtime. 05/31/20   Lilland, Alana, DO  Naphazoline HCl (CLEAR EYES OP) Place 2 drops into both eyes daily as needed (dry eyes).    [provider]  Nutritional  Supplements ([place] PO) Take 237 mLs by mouth daily as needed (appetite, nutrition). Clear/apple    [provider]  tamsulosin (FLOMAX) 0.4 MG CAPS capsule Take 0.4 mg by mouth at bedtime.    [provider]  tiZANidine (ZANAFLEX) 4 MG tablet Take 4 mg by mouth 2 (two) times daily as needed for muscle spasms. 06/11/21   [provider]      Allergies    Aricept [donepezil], Levetiracetam, Penicillins, and Viagra [sildenafil]    Review of Systems   Review of Systems  Physical Exam Updated Vital Signs BP 138/78   Pulse 62   Temp 97.9 F (36.6 C) (Oral)   Resp 19   Ht '5\' 11"'$  (1.803 m)   Wt 69.9 kg   SpO2 97%   BMI 21.48 kg/m  Physical Exam Constitutional:      General: He is not in acute distress. HENT:     Head: Normocephalic and atraumatic.  Eyes:     Conjunctiva/sclera: Conjunctivae normal.     Pupils: Pupils are equal, round, and reactive to light.  Cardiovascular:     Rate and Rhythm: Normal rate and regular rhythm.  Pulmonary:     Effort: Pulmonary effort is normal. No respiratory distress.  Abdominal:     General: There is no distension.     Tenderness: There is no abdominal tenderness.  Genitourinary:    Comments: GU exam shows normal circumcised penis with no active bloody drainage Skin:    General: Skin is warm and dry.  Neurological:     General: No focal deficit present.     Mental Status: He is alert. Mental status is at baseline.  Psychiatric:     Comments: Patient is pleasantly demented     ED Results / Procedures / Treatments   Labs (all labs ordered are listed, but only abnormal results are displayed) Labs Reviewed  COMPREHENSIVE METABOLIC PANEL - Abnormal; Notable for the following components:      Result Value   Glucose, Bld 124 (*)    All other components within normal limits  LACTIC ACID, PLASMA - Abnormal; Notable for the following components:   Lactic Acid, Venous 2.0 (*)    All other  components within normal limits  CBC WITH DIFFERENTIAL/PLATELET - Abnormal; Notable for the following components:   Platelets 143 (*)    All other components within normal limits  URINALYSIS, ROUTINE W REFLEX MICROSCOPIC - Abnormal; Notable for the following components:   Hgb urine dipstick LARGE (*)    Leukocytes,Ua MODERATE (*)    All other components within normal limits  RESP PANEL BY RT-PCR (FLU A&B, COVID) ARPGX2  CULTURE, BLOOD (ROUTINE X 2)  CULTURE, BLOOD (ROUTINE X 2)  URINE CULTURE  LACTIC ACID, PLASMA  PROTIME-INR  EKG EKG Interpretation  Date/Time:  Sunday April 13 2022 09:53:40 EST Ventricular Rate:  96 PR Interval:    QRS Duration: 118 QT Interval:  370 QTC Calculation: 468 R Axis:   -74 Text Interpretation: Accelerated junctional rhythm Left anterior fascicular block Anterior infarct, old Confirmed by Octaviano Glow [phone]) on 04/13/2022 10:12:36 AM  Radiology DG Chest Port 1 View  Result Date: 04/13/2022 CLINICAL DATA:  Hypotension. EXAM: PORTABLE CHEST 1 VIEW COMPARISON:  03/09/2022 FINDINGS: Heart size and mediastinal contours are unremarkable. There is a left chest wall pacer device with leads in the right atrial appendage and right ventricle. Asymmetric elevation of the right hemidiaphragm is unchanged compared with the previous exam. No signs of pleural effusion, interstitial edema or airspace disease. IMPRESSION: 1. No acute cardiopulmonary abnormalities. 2. Stable asymmetric elevation of the right hemidiaphragm. Electronically Signed   By: Kerby Moors M.D.   On: 04/13/2022 11:06    Procedures Procedures    Medications Ordered in ED Medications  sodium chloride 0.9 % bolus 2,000 mL (0 mLs Intravenous Stopped 04/13/22 1323)  ciprofloxacin (CIPRO) tablet 500 mg (500 mg Oral Given 04/13/22 1440)    ED Course/ Medical Decision Making/ A&P Clinical Course as of 04/13/22 1501  Sun Apr 13, 2022  1359 Blood pressure improved to fluids.  Lactate  is pending but if there is improvement I suspect this is likely related to dehydration.  Urinalysis does show hemoglobin and leukocytes, I suspect there may be an ongoing infection, given he has not improved on Keflex we will switch him to ciprofloxacin at this time.  We will also send a urine culture, as it is not clear whether the urologist had this in the office. [MT]    Clinical Course User Index [MT] Shaylynne Lunt, Carola Rhine, MD                           Medical Decision Making Amount and/or Complexity of Data Reviewed Labs: ordered. Radiology: ordered.  Risk Prescription drug management.   This patient presents to the Emergency Department with complaint of altered mental status.  This involves an extensive number of treatment options, and is a complaint that carries with it a high risk of complications and morbidity.  The differential diagnosis includes hypoglycemia vs metabolic encephalopathy vs infection (including cystitis) vs ICH vs stroke vs polypharmacy vs other  Hypotension on arrival may be multifactorial including dehydration versus infection/sepsis.  He is afebrile.  Heart rate 90 to 100 bpm.  No tachypnea.  No leukocytosis.  He does not currently meet sepsis or SIRS criteria's.  We are awaiting a lactate.  I ordered, reviewed, and interpreted labs, including lactate 2.0.  With normal white blood cell count.  Normal hemoglobin.  UA consistent with infection; we will set urine culture I ordered medication IV fluids for hypotension. Additional history was obtained from patient's wife at bedside External records reviewed including outpatient diagnosis of vascular dementia.  I reviewed his urine cultures which often grew less than 10,000 organisms which are multidrug flora, no convincing UTIs on past results for the past 2 years that are available in our records. I personally reviewed the patients ECG which showed sinus rhythm with no acute ischemic findings   On reassessment blood  pressure significantly improved.  Mental status appears improved as well according to his wife, back to his baseline mental state, with some dementia.  Okay for discharge  Ciprofloxacin ordered as an alternative antibiotic for UTI.  I advised her to stop the Keflex.  We will follow-up with urology.  Okay for discharge  Hospitalization was considered but at this time, given improvement of his blood pressure, improvement in mental status, and a treatable cause of his symptoms as an outpatient, I do think outpatient management is reasonable        Final Clinical Impression(s) / ED Diagnoses Final diagnoses:  Urinary tract infection without hematuria, site unspecified  Dehydration    Rx / DC Orders ED Discharge Orders          Ordered    ciprofloxacin (CIPRO) 500 MG tablet  Every 12 hours        04/13/22 1459              Wyvonnia Dusky, MD 04/13/22 1501

## 2022-04-13 NOTE — ED Notes (Signed)
CRITICAL VALUE STICKER  CRITICAL VALUE:Lactic acid 2.0  RECEIVER (on-site recipient of call):I. Wakeelah Solan  DATE & TIME NOTIFIED: 04/13/22 1055 MESSENGER (representative from lab):Jessica  MD NOTIFIED: Trifan  TIME OF NOTIFICATION:1056  RESPONSE:

## 2022-04-13 NOTE — Discharge Instructions (Signed)
You should STOP taking Keflex antibiotic, and START taking ciprofloxacin.  Your next dose is due tonight at bedtime.  Please drink lots of water at home to stay hydrated!

## 2022-04-14 LAB — URINE CULTURE: Culture: NO GROWTH

## 2022-04-17 ENCOUNTER — Telehealth: Payer: Self-pay

## 2022-04-17 NOTE — Telephone Encounter (Signed)
        Patient  visited [facility] on 11/12    Telephone encounter attempt :  1st  A HIPAA compliant voice message was left requesting a return call.  Instructed patient to call back    [facility]  [phone] [address], [place], [place] [address] Phone: [phone] Email: [email or web address]

## 2022-04-18 LAB — CULTURE, BLOOD (ROUTINE X 2)
Culture: NO GROWTH
Culture: NO GROWTH
Special Requests: ADEQUATE

## 2022-05-01 ENCOUNTER — Encounter (HOSPITAL_COMMUNITY): Payer: Self-pay

## 2022-05-01 ENCOUNTER — Emergency Department (HOSPITAL_COMMUNITY)
Admission: EM | Admit: 2022-05-01 | Discharge: 2022-05-01 | Disposition: A | Discharge status: Home / Self Care | Payer: No Typology Code available for payment source | Attending: Emergency Medicine | Admitting: Emergency Medicine

## 2022-05-01 ENCOUNTER — Other Ambulatory Visit: Payer: Self-pay

## 2022-05-01 ENCOUNTER — Emergency Department (HOSPITAL_COMMUNITY): Payer: No Typology Code available for payment source

## 2022-05-01 DIAGNOSIS — Z7982 Long term (current) use of aspirin: Secondary | ICD-10-CM | POA: Diagnosis not present

## 2022-05-01 DIAGNOSIS — Z79899 Other long term (current) drug therapy: Secondary | ICD-10-CM | POA: Insufficient documentation

## 2022-05-01 DIAGNOSIS — I6381 Other cerebral infarction due to occlusion or stenosis of small artery: Secondary | ICD-10-CM | POA: Diagnosis not present

## 2022-05-01 DIAGNOSIS — J189 Pneumonia, unspecified organism: Secondary | ICD-10-CM | POA: Insufficient documentation

## 2022-05-01 DIAGNOSIS — I3139 Other pericardial effusion (noninflammatory): Secondary | ICD-10-CM | POA: Diagnosis not present

## 2022-05-01 DIAGNOSIS — F0390 Unspecified dementia without behavioral disturbance: Secondary | ICD-10-CM | POA: Insufficient documentation

## 2022-05-01 DIAGNOSIS — R531 Weakness: Secondary | ICD-10-CM | POA: Diagnosis present

## 2022-05-01 DIAGNOSIS — I10 Essential (primary) hypertension: Secondary | ICD-10-CM | POA: Insufficient documentation

## 2022-05-01 LAB — CBC
HCT: 49 % (ref 39.0–52.0)
Hemoglobin: 16.1 g/dL (ref 13.0–17.0)
MCH: 32 pg (ref 26.0–34.0)
MCHC: 32.9 g/dL (ref 30.0–36.0)
MCV: 97.4 fL (ref 80.0–100.0)
Platelets: 168 10*3/uL (ref 150–400)
RBC: 5.03 MIL/uL (ref 4.22–5.81)
RDW: 13.8 % (ref 11.5–15.5)
WBC: 8 10*3/uL (ref 4.0–10.5)
nRBC: 0 % (ref 0.0–0.2)

## 2022-05-01 LAB — TROPONIN I (HIGH SENSITIVITY): Troponin I (High Sensitivity): 14 ng/L (ref <18)

## 2022-05-01 LAB — COMPREHENSIVE METABOLIC PANEL
ALT: 18 U/L (ref 0–44)
AST: 18 U/L (ref 15–41)
Albumin: 3.9 g/dL (ref 3.5–5.0)
Alkaline Phosphatase: 61 U/L (ref 38–126)
Anion gap: 8 (ref 5–15)
BUN: 21 mg/dL (ref 8–23)
CO2: 26 mmol/L (ref 22–32)
Calcium: 9.8 mg/dL (ref 8.9–10.3)
Chloride: 109 mmol/L (ref 98–111)
Creatinine, Ser: 1.02 mg/dL (ref 0.61–1.24)
GFR, Estimated: >60 mL/min (ref >60)
Glucose, Bld: 121 mg/dL — ABNORMAL HIGH (ref 70–99)
Potassium: 3.7 mmol/L (ref 3.5–5.1)
Sodium: 143 mmol/L (ref 135–145)
Total Bilirubin: 0.5 mg/dL (ref 0.3–1.2)
Total Protein: 7.6 g/dL (ref 6.5–8.1)

## 2022-05-01 LAB — URINALYSIS, ROUTINE W REFLEX MICROSCOPIC
Bacteria, UA: NONE SEEN
Bilirubin Urine: NEGATIVE
Glucose, UA: NEGATIVE mg/dL
Hgb urine dipstick: NEGATIVE
Ketones, ur: NEGATIVE mg/dL
Nitrite: NEGATIVE
Protein, ur: 30 mg/dL — AB
Specific Gravity, Urine: 1.012 (ref 1.005–1.030)
pH: 6 (ref 5.0–8.0)

## 2022-05-01 LAB — CBG MONITORING, ED: Glucose-Capillary: 107 mg/dL — ABNORMAL HIGH (ref 70–99)

## 2022-05-01 MED ORDER — AZITHROMYCIN 250 MG PO TABS
250.0000 mg | ORAL_TABLET | Freq: Every day | ORAL | 0 refills | Status: DC
  Filled 2022-05-02: qty 6, 6d supply, fill #0

## 2022-05-01 MED ORDER — POLYETHYLENE GLYCOL 3350 17 G PO PACK: 17.0000 g | PACK | Freq: Every day | ORAL | 0 refills | Status: DC

## 2022-05-01 MED ORDER — ONDANSETRON HCL 4 MG/2ML IJ SOLN
4.0000 mg | Freq: Once | INTRAMUSCULAR | Status: AC
  Administered 2022-05-01: 4 mg via INTRAVENOUS
  Filled 2022-05-01: qty 2

## 2022-05-01 MED ORDER — IOHEXOL 350 MG/ML SOLN
75.0000 mL | Freq: Once | INTRAVENOUS | Status: AC | PRN
  Administered 2022-05-01: 75 mL via INTRAVENOUS

## 2022-05-01 MED ORDER — CEPHALEXIN 500 MG PO CAPS
500.0000 mg | ORAL_CAPSULE | Freq: Three times a day (TID) | ORAL | 0 refills | Status: DC
  Filled 2022-05-02: qty 21, 7d supply, fill #0

## 2022-05-01 MED ORDER — CEPHALEXIN 500 MG PO CAPS
500.0000 mg | ORAL_CAPSULE | Freq: Once | ORAL | Status: AC
  Administered 2022-05-01: 500 mg via ORAL
  Filled 2022-05-01: qty 1

## 2022-05-01 NOTE — Discharge Instructions (Addendum)
You have possible aspiration pneumonia.  Take Keflex and azithromycin as prescribed.  Please stay hydrated.  I did a rectal exam and you do not have a stool impaction.  I do recommend taking MiraLAX daily for constipation  See your doctor for follow-up  Return to ER if you have trouble breathing, passing out, severe abdominal pain

## 2022-05-01 NOTE — ED Provider Notes (Signed)
[facility] DEPT Provider Note   CSN: [id number] Arrival date & time: 05/01/22  1751     History  Chief Complaint  Patient presents with   Weakness    Dustin Hamilton is a 81 y.o. male history of hypertension, recurrent UTI,Dementia here presenting with near syncope.  Patient was recently treated for UTI.  Patient states that he went to the bathroom and had a bowel movement and then felt weak and passed out.  Patient states that he did not hit his head.  Patient has has recent UTI and finished a course of antibiotics.  Denies any chest pain or shortness of breath.  The history is provided by the patient.       Home Medications Prior to Admission medications   Medication Sig Start Date End Date Taking? Authorizing Provider  acetaminophen (TYLENOL) 325 MG tablet Take 2 tablets (650 mg total) by mouth every 6 (six) hours as needed for moderate pain. 02/03/22   Jaynee Eagles, PA-C  allopurinol (ZYLOPRIM) 100 MG tablet Take 1 tablet (100 mg total) by mouth daily. 04/05/21 04/05/22  Pokhrel, Corrie Mckusick, MD  amLODipine (NORVASC) 2.5 MG tablet Take 2.5 mg by mouth daily.    [provider]  aspirin 81 MG EC tablet Take 1 tablet (81 mg total) by mouth daily. Swallow whole. 09/06/21   Raiford Noble Latif, DO  atorvastatin (LIPITOR) 40 MG tablet Take 0.5 tablets (20 mg total) by mouth daily. 09/06/21   Raiford Noble Latif, DO  brimonidine (ALPHAGAN) 0.2 % ophthalmic solution Place 1 drop into both eyes 2 (two) times daily.    [provider]  cefdinir (OMNICEF) 300 MG capsule Take 1 capsule (300 mg total) by mouth 2 (two) times daily. 03/10/22   Margarita Mail, PA-C  colchicine 0.6 MG tablet Take 1 tablet (0.6 mg total) by mouth daily. 08/25/21   Horton, Alvin Critchley, DO  diclofenac Sodium (VOLTAREN) 1 % GEL Apply 4 g topically 4 (four) times daily. Patient taking differently: Apply 4 g topically daily as needed (pain). 06/05/21   Deno Etienne, DO  dutasteride  (AVODART) 0.5 MG capsule Take 0.5 mg by mouth daily.    [provider]  fidaxomicin (DIFICID) 200 MG TABS tablet Take 1 tablet (200 mg total) by mouth 2 (two) times daily. Patient taking differently: Take 200 mg by mouth See admin instructions. Start date : 09/03/21. Take 200 mg 2 times a day for 5 days then take 200 mg every other day for 20 days. 07/29/21   Kommor, [place], MD  latanoprost (XALATAN) 0.005 % ophthalmic solution Place 1 drop into both eyes at bedtime.     [provider]  lidocaine (LIDODERM) 5 % Place 1 patch onto the skin daily as needed (back pain). Remove & Discard patch within 12 hours or as directed by MD    [provider]  memantine (NAMENDA) 10 MG tablet Take 20 mg by mouth at bedtime.    [provider]  methocarbamol (ROBAXIN) 500 MG tablet Take 1 tablet (500 mg total) by mouth 2 (two) times daily. Patient not taking: Reported on 09/04/2021 06/05/21   Deno Etienne, DO  metoprolol succinate (TOPROL-XL) 25 MG 24 hr tablet Take 0.5 tablets (12.5 mg total) by mouth at bedtime. 05/31/20   Lilland, Alana, DO  Naphazoline HCl (CLEAR EYES OP) Place 2 drops into both eyes daily as needed (dry eyes).    [provider]  Nutritional Supplements ([place]) Take 237  mLs by mouth daily as needed (appetite, nutrition). Clear/apple    [provider]  tamsulosin (FLOMAX) 0.4 MG CAPS capsule Take 0.4 mg by mouth at bedtime.    [provider]  tiZANidine (ZANAFLEX) 4 MG tablet Take 4 mg by mouth 2 (two) times daily as needed for muscle spasms. 06/11/21   [provider]      Allergies    Aricept [donepezil], Levetiracetam, Penicillins, and Viagra [sildenafil]    Review of Systems   Review of Systems  Neurological:  Positive for syncope and weakness.  All other systems reviewed and are negative.   Physical Exam Updated Vital Signs BP 126/78   Pulse 77   Temp 98.3 F (36.8 C)   Resp 16   Ht  '5\' 11"'$  (1.803 m)   Wt 69.9 kg   SpO2 99%   BMI 21.48 kg/m  Physical Exam Vitals and nursing note reviewed.  Constitutional:      Comments: Demented but well-appearing.  HENT:     Head: Normocephalic and atraumatic.     Comments: No signs of head trauma    Right Ear: Tympanic membrane normal.     Left Ear: Tympanic membrane normal.     Nose: Nose normal.     Mouth/Throat:     Mouth: Mucous membranes are moist.  Eyes:     Extraocular Movements: Extraocular movements intact.     Pupils: Pupils are equal, round, and reactive to light.  Cardiovascular:     Rate and Rhythm: Regular rhythm. Tachycardia present.     Pulses: Normal pulses.     Heart sounds: Normal heart sounds.  Pulmonary:     Effort: Pulmonary effort is normal.     Breath sounds: Normal breath sounds.  Abdominal:     General: Abdomen is flat.     Palpations: Abdomen is soft.  Musculoskeletal:        General: Normal range of motion.     Cervical back: Normal range of motion and neck supple.     Comments: Normal range of motion bilateral hips.  No signs of spinal trauma and no extremity trauma  Skin:    General: Skin is warm.     Capillary Refill: Capillary refill takes less than 2 seconds.  Neurological:     Comments: Demented but ANO x 3.  Patient has normal strength bilateral arms and legs.  Psychiatric:        Mood and Affect: Mood normal.        Behavior: Behavior normal.     ED Results / Procedures / Treatments   Labs (all labs ordered are listed, but only abnormal results are displayed) Labs Reviewed  URINALYSIS, ROUTINE W REFLEX MICROSCOPIC - Abnormal; Notable for the following components:      Result Value   Protein, ur 30 (*)    Leukocytes,Ua MODERATE (*)    All other components within normal limits  COMPREHENSIVE METABOLIC PANEL - Abnormal; Notable for the following components:   Glucose, Bld 121 (*)    All other components within normal limits  CBG MONITORING, ED - Abnormal; Notable for the  following components:   Glucose-Capillary 107 (*)    All other components within normal limits  CBC  CBG MONITORING, ED  TROPONIN I (HIGH SENSITIVITY)    EKG EKG Interpretation  Date/Time:  Thursday May 01 2022 18:06:13 EST Ventricular Rate:  90 PR Interval:  256 QRS Duration: 120 QT Interval:  350 QTC Calculation: 429 R Axis:   -  68 Text Interpretation: Wandering atrial pacemaker Probable left atrial enlargement Left anterior fascicular block Anterior infarct, old No significant change since last tracing Confirmed by Wandra Arthurs [id number]) on 05/01/2022 7:37:29 PM  Radiology CT HEAD WO CONTRAST (5MM)  Result Date: 05/01/2022 CLINICAL DATA:  Mental status changes, unknown cause, possible syncopal or presyncopal episode. Pulmonary embolism suspected. High probability. EXAM: CT HEAD WITHOUT CONTRAST CT ANGIOGRAPHY CHEST WITH CONTRAST TECHNIQUE: Contiguous axial images were obtained from the base of the skull through the vertex without intravenous contrast. Multiplanar reconstructions were also created from the original data. Multidetector CT imaging of the chest was performed using the standard protocol during bolus administration of intravenous contrast. Multiplanar CT image reconstructions and MIP images were obtained to evaluate vascular anatomy. RADIATION DOSE REDUCTION: This exam was performed according to the departmental dose-optimization program which includes automated exposure control, adjustment of the mA and/or kV according to patient size and/or use of iterative reconstruction technique. CONTRAST:  75 mL Omnipaque 350 IV. COMPARISON:  CTA chest 06/26/2021, MR brain 09/05/2021, head CT 09/27/2021. FINDINGS: CT HEAD WITHOUT CONTRAST FINDINGS Brain: There is moderately advanced cerebral atrophy with atrophic ventriculomegaly, mild cerebellar atrophy and advanced chronic small vessel disease of the cerebral white matter with small scattered chronic lacunar infarcts in the  frontoparietal deep white matter of both hemispheres. There are additional chronic ischemic changes in both thalami. No new asymmetry is seen concerning for an acute infarct, hemorrhage or mass. Vascular: There are calcifications in the siphons and distal vertebral arteries. No hyperdense central vessel is seen. Skull: Negative for fractures or focal lesions. Sinuses/Orbits: No acute abnormality. There is chronic scattered fluid in the bilateral mastoid air cells and bilateral mastoid bone underpneumatization. Visualized sinuses are clear. Other: None. CTA CHEST FINDINGS Cardiovascular: The heart is shifted to the left again due to a chronic moderately elevated right diaphragm. The cardiac size is upper limit of normal. There is minimal calcification in the LAD coronary artery. There is artifact from a left chest pacemaker and right atrial and ventricular wire insertions a small pericardial effusion is unchanged. Pulmonary arteries are normal caliber and clear at least through the segmental divisions. The subsegmental arterial bed is largely not evaluated due to respiratory motion artifact particularly in the lower zones. A subsegmental embolus could be missed. There is mild aortic atherosclerotic changes and tortuosity, scattered calcification in the valve leaflets. No aneurysm or dissection is seen. The great vessels are not well opacified but only trace calcification is noted in the left subclavian artery. The pulmonary veins are decompressed. Mediastinum/nodes: Unchanged diffuse thyroid enlargement. No dominant mass is visible. Slightly prominent subcarinal lymph nodes up to 1.2 cm in short axis are unaltered. No esophageal thickening or tracheal lesion is seen. No other intrathoracic adenopathy. Lungs/pleura: There is chronic atelectasis or scarring in the posterior basal right lower lobe, but in the superior segment there is increased ground-glass opacity concerning for pneumonitis. A follow-up study is  recommended to ensure clearing. The lungs are otherwise clear. There is no pleural effusion, thickening or pneumothorax. Upper abdomen: Colonic interposition of the hepatic flexure is again noted anterior to the liver, with diverticulosis of the visualized colon. No acute abnormality is seen. 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 No concerning regional bone lesion. Mild DJD both shoulders. IMPRESSION: 1. No acute intracranial CT findings or interval changes. Stable chronic change. 2. Scattered chronic fluid in the bilateral mastoid air cells. 3. No pulmonary arterial dilatation is seen and no embolus through  the segmental divisions. The subsegmental arterial bed is largely not evaluated due to respiratory motion artifact. A subsegmental embolus could be missed. 4. Aortic and coronary artery atherosclerosis. 5. Stable small pericardial effusion. 6. Increased ground-glass opacity in the superior segment of the right lower lobe concerning for pneumonitis. A follow-up study is recommended to ensure clearing. Chronic change right lower lobe posterior base. 7. Chronic moderate elevation of the right diaphragm. 8. Unchanged diffuse thyroid enlargement. Aortic Atherosclerosis (ICD10-I70.0). Electronically Signed   By: Telford Nab M.D.   On: 05/01/2022 21:08   CT Angio Chest PE W and/or Wo Contrast  Result Date: 05/01/2022 CLINICAL DATA:  Mental status changes, unknown cause, possible syncopal or presyncopal episode. Pulmonary embolism suspected. High probability. EXAM: CT HEAD WITHOUT CONTRAST CT ANGIOGRAPHY CHEST WITH CONTRAST TECHNIQUE: Contiguous axial images were obtained from the base of the skull through the vertex without intravenous contrast. Multiplanar reconstructions were also created from the original data. Multidetector CT imaging of the chest was performed using the standard protocol during bolus administration of intravenous contrast. Multiplanar CT image  reconstructions and MIP images were obtained to evaluate vascular anatomy. RADIATION DOSE REDUCTION: This exam was performed according to the departmental dose-optimization program which includes automated exposure control, adjustment of the mA and/or kV according to patient size and/or use of iterative reconstruction technique. CONTRAST:  75 mL Omnipaque 350 IV. COMPARISON:  CTA chest 06/26/2021, MR brain 09/05/2021, head CT 09/27/2021. FINDINGS: CT HEAD WITHOUT CONTRAST FINDINGS Brain: There is moderately advanced cerebral atrophy with atrophic ventriculomegaly, mild cerebellar atrophy and advanced chronic small vessel disease of the cerebral white matter with small scattered chronic lacunar infarcts in the frontoparietal deep white matter of both hemispheres. There are additional chronic ischemic changes in both thalami. No new asymmetry is seen concerning for an acute infarct, hemorrhage or mass. Vascular: There are calcifications in the siphons and distal vertebral arteries. No hyperdense central vessel is seen. Skull: Negative for fractures or focal lesions. Sinuses/Orbits: No acute abnormality. There is chronic scattered fluid in the bilateral mastoid air cells and bilateral mastoid bone underpneumatization. Visualized sinuses are clear. Other: None. CTA CHEST FINDINGS Cardiovascular: The heart is shifted to the left again due to a chronic moderately elevated right diaphragm. The cardiac size is upper limit of normal. There is minimal calcification in the LAD coronary artery. There is artifact from a left chest pacemaker and right atrial and ventricular wire insertions a small pericardial effusion is unchanged. Pulmonary arteries are normal caliber and clear at least through the segmental divisions. The subsegmental arterial bed is largely not evaluated due to respiratory motion artifact particularly in the lower zones. A subsegmental embolus could be missed. There is mild aortic atherosclerotic changes and  tortuosity, scattered calcification in the valve leaflets. No aneurysm or dissection is seen. The great vessels are not well opacified but only trace calcification is noted in the left subclavian artery. The pulmonary veins are decompressed. Mediastinum/nodes: Unchanged diffuse thyroid enlargement. No dominant mass is visible. Slightly prominent subcarinal lymph nodes up to 1.2 cm in short axis are unaltered. No esophageal thickening or tracheal lesion is seen. No other intrathoracic adenopathy. Lungs/pleura: There is chronic atelectasis or scarring in the posterior basal right lower lobe, but in the superior segment there is increased ground-glass opacity concerning for pneumonitis. A follow-up study is recommended to ensure clearing. The lungs are otherwise clear. There is no pleural effusion, thickening or pneumothorax. Upper abdomen: Colonic interposition of the hepatic flexure is again noted anterior to  the liver, with diverticulosis of the visualized colon. No acute abnormality is seen. 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 No concerning regional bone lesion. Mild DJD both shoulders. IMPRESSION: 1. No acute intracranial CT findings or interval changes. Stable chronic change. 2. Scattered chronic fluid in the bilateral mastoid air cells. 3. No pulmonary arterial dilatation is seen and no embolus through the segmental divisions. The subsegmental arterial bed is largely not evaluated due to respiratory motion artifact. A subsegmental embolus could be missed. 4. Aortic and coronary artery atherosclerosis. 5. Stable small pericardial effusion. 6. Increased ground-glass opacity in the superior segment of the right lower lobe concerning for pneumonitis. A follow-up study is recommended to ensure clearing. Chronic change right lower lobe posterior base. 7. Chronic moderate elevation of the right diaphragm. 8. Unchanged diffuse thyroid enlargement. Aortic Atherosclerosis  (ICD10-I70.0). Electronically Signed   By: Telford Nab M.D.   On: 05/01/2022 21:08    Procedures Procedures    Medications Ordered in ED Medications  cephALEXin (KEFLEX) capsule 500 mg (has no administration in time range)  ondansetron (ZOFRAN) injection 4 mg (4 mg Intravenous Given 05/01/22 2019)  iohexol (OMNIPAQUE) 350 MG/ML injection 75 mL (75 mLs Intravenous Contrast Given 05/01/22 2026)    ED Course/ Medical Decision Making/ A&P                           Medical Decision Making Dustin Hamilton is a 81 y.o. male here presenting with near syncope.  Patient was noted to be tachycardic.  Patient likely has vasovagal syncope after bowel movement.  However he is tachycardic in the ED.  Concern for possible PE so we will get a CT PE scan.  He is demented so unable to tell me if he hit his head or not.  Will get a CT head to rule out intracranial bleeding.  Patient also has history of UTI so we will get urinalysis.  Will also get CBC and CMP and troponin and orthostatics and will hydrate and reassess.  10:32 PM Patient was orthostatic initially.  Given IV fluids and heart rate is in the 70s.  CT head showed no bleed.  CT chest showed possible pneumonitis.  Patient is a poor historian.  His UA showed some no bacteria.  His previous urine culture is negative.  Patient was given Keflex.  Will discharge with Keflex and azithromycin to cover possible aspiration pneumonia.  He has allergy to penicillin so I cannot give him Augmentin.  Stable for discharge.  I have updated family.   Problems Addressed: Community acquired pneumonia of right lower lobe of lung: acute illness or injury  Amount and/or Complexity of Data Reviewed Labs: ordered. Decision-making details documented in ED Course. Radiology: ordered and independent interpretation performed. Decision-making details documented in ED Course.  Risk Prescription drug management.    Final Clinical Impression(s) / ED Diagnoses Final  diagnoses:  None    Rx / DC Orders ED Discharge Orders     None         Drenda Freeze, MD 05/01/22 2241

## 2022-05-01 NOTE — ED Notes (Signed)
During ortho VS, pt felt dizzy and nausea throughout the whole process.

## 2022-05-01 NOTE — ED Provider Triage Note (Signed)
Emergency Medicine Provider Triage Evaluation Note  Dustin Hamilton , a 81 y.o. male  was evaluated in triage.  Patient tells me he has no clue why he is here.  Per EMS he has been weak and had a fall and they heard this from patient's wife.  Workup initiated  Review of Systems  Past medical history of dementia  Physical Exam  BP (!) 139/104   Pulse (!) 107   Temp 98.3 F (36.8 C) (Oral)   Resp 16   Ht '5\' 11"'$  (1.803 m)   Wt 69.9 kg   SpO2 95%   BMI 21.48 kg/m  Gen:   Awake, no distress   Resp:  Normal effort  MSK:   Moves extremities without difficulty  Other:  Chronically ill-appearing, very pleasant  Medical Decision Making  Medically screening exam initiated at 6:14 PM.  Appropriate orders placed.  Dustin Hamilton was informed that the remainder of the evaluation will be completed by another provider, this initial triage assessment does not replace that evaluation, and the importance of remaining in the ED until their evaluation is complete.     [name], PA-C 05/01/22 1815

## 2022-05-01 NOTE — ED Triage Notes (Signed)
Per EMS- patient 's wife reports that the patient went to the BR to have a BM and when he walked back to the bed with his walker, he fell to his knees. Patient did not have a syncopal episode nor did he hit his head.   Patient was given a NS bolus -500 ml. Patient also had a UTI during [facility].

## 2022-05-02 ENCOUNTER — Other Ambulatory Visit: Payer: Self-pay

## 2022-05-16 DIAGNOSIS — I10 Essential (primary) hypertension: Secondary | ICD-10-CM | POA: Diagnosis not present

## 2022-05-16 DIAGNOSIS — E871 Hypo-osmolality and hyponatremia: Secondary | ICD-10-CM | POA: Diagnosis not present

## 2022-05-19 DIAGNOSIS — F0390 Unspecified dementia without behavioral disturbance: Secondary | ICD-10-CM | POA: Diagnosis not present

## 2022-05-19 DIAGNOSIS — I10 Essential (primary) hypertension: Secondary | ICD-10-CM | POA: Diagnosis not present

## 2022-05-19 DIAGNOSIS — R7989 Other specified abnormal findings of blood chemistry: Secondary | ICD-10-CM | POA: Diagnosis not present

## 2022-05-19 DIAGNOSIS — E870 Hyperosmolality and hypernatremia: Secondary | ICD-10-CM | POA: Diagnosis not present

## 2022-05-19 DIAGNOSIS — N390 Urinary tract infection, site not specified: Secondary | ICD-10-CM | POA: Diagnosis not present

## 2022-06-09 DIAGNOSIS — R310 Gross hematuria: Secondary | ICD-10-CM | POA: Diagnosis not present

## 2022-06-09 DIAGNOSIS — N401 Enlarged prostate with lower urinary tract symptoms: Secondary | ICD-10-CM | POA: Diagnosis not present

## 2022-06-18 ENCOUNTER — Other Ambulatory Visit: Payer: Self-pay | Admitting: Urology

## 2022-06-18 DIAGNOSIS — R310 Gross hematuria: Secondary | ICD-10-CM

## 2022-06-26 ENCOUNTER — Ambulatory Visit
Admission: RE | Admit: 2022-06-26 | Discharge: 2022-06-26 | Disposition: A | Discharge status: Home / Self Care | Payer: Medicare PPO | Source: Ambulatory Visit | Attending: Urology | Admitting: Urology

## 2022-06-26 DIAGNOSIS — R310 Gross hematuria: Secondary | ICD-10-CM

## 2022-06-26 HISTORY — PX: IR RADIOLOGIST EVAL & MGMT: IMG5224

## 2022-06-26 NOTE — Consult Note (Addendum)
Chief Complaint: Lower Urinary Tract Symptoms  Referring Physician(s): Hamilton,Dustin Marjory Lies  [facility] Theda Hamilton  History of Present Illness: Dustin Hamilton is a 82 y.o. male presenting [facility], kindly referred by Dr. Lovena Hamilton of Urology, for evaluation of BPH/LUTS and possible candidacy for prostate artery embolization.   Dustin Hamilton is here today with his wife for the interview.  She provides the majority of the history.  She tells me he has diagnosis of dementia.   They describe to me that last calendar year, since December of 2022, he has had several UTI' -- they are recalling about 4 episodes over the course of the year..  He has been treated with ABX.  Additionally, he has had some episodes of hematuria, with last UA that I see from [facility] 04/13/22 noting large hgb.  Apparently he has had additional episodes of gross hematuria requiring care at ED/urgent care.   Currently, he does not have a urinary catheter in place.  Today he is telling me "I dont have many problems" but his wife is dismissing this because of his mental status.  She provides the history on his IPSS, and is describing to me storage type symptoms.    Dr. Jackson Hamilton note from 06/09/22 makes note of PVR 98cc, confirms he is taking tamsulosin (0.4) and finasteride '5mg'$  1 daily) (hx also of dutasteride), PSA = 8.41 ng/ml. (12.7 ng/mll = 05/01/16), cystoscopy confirms no trabeculation, no stones.    IPSS = 21  QoL = 5    CT imaging, including the most recent of 08/25/21:   Estimated prostate volume/density (via mdcalc) is >150g.  [name], [name]  Prior stroke with no residual deficit.  He also has history of vascular insufficiency as etiology of ED, with atherosclerosis.    Allergy to PCN   Past Medical History:  Diagnosis Date   BPH (benign prostatic hyperplasia)    Cerebrovascular disease     Clostridium difficile diarrhea    Congenital anomaly of diaphragm    Elevated PSA    Glaucoma, both eyes    Hemorrhoid    Hepatitis B surface antigen positive    02-20-2011   History of adenomatous polyp of colon    2007, 2009 and 2013  tubular adenoma's   History of alcohol abuse    quit 1963   History of cerebral parenchymal hemorrhage    01/ 2006  left occiptial lobe related to hypertensive crisis   History of CVA (cerebrovascular accident)    09-12-2012  left hippocampus/ amygdala junction and per MRI old white matter infarcts--  per pt residual short- term memory issues   History of fatty infiltration of liver hx visit's at [facility] , last visit 05/ 2014   elvated LFT's ,  via liver bx 2004 related to hx alcohol and drug abuse (quit 1964)   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   HTN (hypertension)    Renal cyst, left    Stroke ([place])    hx of 3 strokes in past    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    Urethral lesion    urethral mass    Past Surgical History:  Procedure Laterality Date   CARDIOVASCULAR STRESS TEST  05/05/2007   normal nuclear study w/ no ischemia/  normal  LV fucntion and wall motion , ef60%   COLONOSCOPY  last one 04-06-2012   CYSTO/  LEFT RETROGRADE PYELOGRAM/ CYTOLOGY WASHINGS/  URETEROSCOPY  03/05/2000   INGUINAL HERNIA REPAIR Bilateral 1965 and 1980's   IR RADIOLOGIST EVAL & MGMT  06/26/2022   LAPAROSCOPIC INGUINAL HERNIA WITH UMBILICAL HERNIA Right 97/07/6376   LIVER BIOPSY  1980's and 2004   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   Procedure: PACEMAKER IMPLANT;  Surgeon: Evans Lance, MD;  Location: [facility];  Service: Cardiovascular;  Laterality: N/A;   SVT ABLATION N/A 11/15/2018   Procedure: SVT ABLATION;  Surgeon: Evans Lance, MD;  Location: [facility];  Service: Cardiovascular;  Laterality: N/A;   TRANSTHORACIC ECHOCARDIOGRAM  09/13/2012   moderate LVH,   ef 60-65%/     TRANSURETHRAL RESECTION OF BLADDER TUMOR N/A 08/11/2016   Procedure: TRANSURETHRAL RESECTION OF BLADDER TUMOR (TURBT);  Surgeon: Cleon Gustin, MD;  Location[facility];  Service: Urology;  Laterality: N/A;    Allergies: Aricept [donepezil], Levetiracetam, Penicillins, and Viagra [sildenafil]  Medications: Prior to Admission medications   Medication Sig Start Date End Date Taking? Authorizing Provider  acetaminophen (TYLENOL) 325 MG tablet Take 2 tablets (650 mg total) by mouth every 6 (six) hours as needed for moderate pain. 02/03/22   Jaynee Eagles, PA-C  allopurinol (ZYLOPRIM) 100 MG tablet Take 1 tablet (100 mg total) by mouth daily. 04/05/21 04/05/22  Pokhrel, Corrie Mckusick, MD  amLODipine (NORVASC) 2.5 MG tablet Take 2.5 mg by mouth daily.    [provider]  aspirin 81 MG EC tablet Take 1 tablet (81 mg total) by mouth daily. Swallow whole. 09/06/21   Raiford Noble Latif, DO  atorvastatin (LIPITOR) 40 MG tablet Take 0.5 tablets (20 mg total) by mouth daily. 09/06/21   Raiford Noble Latif, DO  brimonidine (ALPHAGAN) 0.2 % ophthalmic solution Place 1 drop into both eyes 2 (two) times daily.    [provider]  cefdinir (OMNICEF) 300 MG capsule Take 1 capsule (300 mg total) by mouth 2 (two) times daily. 03/10/22   Margarita Mail, PA-C  colchicine 0.6 MG tablet Take 1 tablet (0.6 mg total) by mouth daily. 08/25/21   Horton, Alvin Critchley, DO  diclofenac Sodium (VOLTAREN) 1 % GEL Apply 4 g topically 4 (four) times daily. Patient taking differently: Apply 4 g topically daily as needed (pain). 06/05/21   Deno Etienne, DO  dutasteride (AVODART) 0.5 MG capsule Take 0.5 mg by mouth daily.    [provider]  fidaxomicin (DIFICID) 200 MG TABS tablet Take 1 tablet (200 mg total) by mouth 2 (two) times daily. Patient taking differently: Take 200 mg by mouth See admin instructions. Start date : 09/03/21. Take 200 mg 2 times a day for 5 days then take 200 mg every  other day for 20 days. 07/29/21   Kommor, Madison, MD  latanoprost (XALATAN) 0.005 % ophthalmic solution Place 1 drop into both eyes at bedtime.     [provider]  lidocaine (LIDODERM) 5 % Place 1 patch onto the skin daily as needed (back pain). Remove & Discard patch within 12 hours or as directed by MD    [provider]  memantine (NAMENDA) 10 MG tablet Take 20 mg by mouth at bedtime.    [provider]  methocarbamol (ROBAXIN) 500 MG tablet Take 1 tablet (500 mg total) by mouth 2 (two) times daily. Patient not taking: Reported on 09/04/2021 06/05/21   Deno Etienne, DO  metoprolol succinate (TOPROL-XL) 25 MG 24 hr tablet Take 0.5 tablets (12.5 mg total) by mouth at bedtime. 05/31/20   Lilland, Alana, DO  Naphazoline HCl (CLEAR EYES OP) Place 2 drops into both eyes daily as needed (dry eyes).    [provider]  Nutritional Supplements ([place] PO) Take 237 mLs by mouth daily as needed (appetite, nutrition). Clear/apple    [provider]  polyethylene glycol (MIRALAX / GLYCOLAX) 17 g packet Take 17 g by mouth daily. 05/01/22   Drenda Freeze, MD  tamsulosin (FLOMAX) 0.4 MG CAPS capsule Take 0.4 mg by mouth at bedtime.    [provider]  tiZANidine (ZANAFLEX) 4 MG tablet Take 4 mg by mouth 2 (two) times daily as needed for muscle spasms. 06/11/21   [provider]     Family History  Problem Relation Age of Onset   Rheum arthritis Mother    Diabetes Mother    Stroke Mother    Heart attack Mother    Kidney failure Mother    Heart attack Father    Heart disease Maternal Grandmother    Rheum arthritis Maternal Grandmother    Diabetes Maternal Grandmother    Stroke Maternal Grandmother    Colon cancer Neg Hx     Social History   Socioeconomic History   Marital status: Married    Spouse name: [name]   Number of children: 1   Years of education: College   Highest education level: Not on file  Occupational  History   Occupation: Geophysicist/field seismologist  Tobacco Use   Smoking status: Former    Packs/day: 1.00    Years: 5.00    Total pack years: 5.00    Types: Cigarettes    Quit date: 11/30/1981    Years since quitting: 40.5   Smokeless tobacco: Never  Vaping Use   Vaping Use: Never used  Substance and Sexual Activity   Alcohol use: No    Alcohol/week: 0.0 standard drinks of alcohol    Comment: hx abuse -- quit:  1963   Drug use: No    Comment: 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   Caffeine Use:  Tea, lots   Social Determinants of Health   Financial Resource Strain: Not on file  Food Insecurity: Not on file  Transportation Needs: Not on file  Physical Activity: Not on file  Stress: Not on file  Social Connections: Not on file      Review of Systems: A 12 point ROS discussed and pertinent positives are indicated in the HPI above.  All other systems are negative.  Review of Systems  Vital Signs: BP (!) 144/81 (BP Location: Left Arm)   Pulse 91   SpO2 96%   Advance Care Plan: The advanced care plan/surrogate decision maker was discussed at the time of visit and documented in the medical record.    Physical Exam General: 82 yo male appearing stated age.  Well-developed, well-nourished.  No distress. HEENT: Atraumatic, normocephalic.  Conjugate gaze, extra-ocular motor intact. No scleral icterus or scleral injection. No lesions on external ears, nose, lips, or gums.  Oral mucosa moist, pink.  Neck: Symmetric with no goiter enlargement.  Chest/Lungs:  Symmetric chest with inspiration/expiration.  No labored breathing.  Clear to auscultation with no wheezes, rhonchi, or rales.  Heart:  RRR, with no third heart sounds appreciated. No JVD appreciated.  Abdomen:  Soft, NT/ND, with + bowel sounds.   Genito-urinary: Deferred Neurologic: Flattened  affect. Moving all 4 extremities with gross sensory intact.     Imaging: IR Radiologist Eval & Mgmt  Result Date: 06/26/2022 EXAM: NEW PATIENT OFFICE VISIT CHIEF COMPLAINT: Electronic medical record HISTORY OF PRESENT ILLNESS: Electronic medical record REVIEW OF SYSTEMS: Electronic medical record PHYSICAL EXAMINATION: Electronic medical record ASSESSMENT AND PLAN: Electronic medical record Electronically Signed   By: Corrie Mckusick D.O.   On: 06/26/2022 16:07    Labs:  CBC: Recent Labs    03/09/22 1143 04/12/22 1640 04/13/22 1010 05/01/22 1838  WBC 4.3 4.4 5.0 8.0  HGB 15.8 16.3 16.3 16.1  HCT 46.8 48.9 47.9 49.0  PLT 134* 125* 143* 168    COAGS: Recent Labs    09/04/21 1324 04/13/22 1010  INR 1.2 1.1    BMP: Recent Labs    03/09/22 1143 04/12/22 1640 04/13/22 1010 05/01/22 1838  NA 143 141 141 143  K 3.5 3.8 3.7 3.7  CL 110 103 106 109  CO2 '29 27 27 26  '$ GLUCOSE 88 85 124* 121*  BUN '19 18 22 21  '$ CALCIUM 9.5 9.8 9.9 9.8  CREATININE 0.92 0.95 1.15 1.02  GFRNONAA >60 >60 >60 >60    LIVER FUNCTION TESTS: Recent Labs    09/06/21 0414 09/27/21 2120 04/13/22 1010 05/01/22 1838  BILITOT 0.9 0.6 0.4 0.5  AST 14* '23 16 18  '$ ALT '12 14 14 18  '$ ALKPHOS 59 61 58 61  PROT 7.2 7.3 7.3 7.6  ALBUMIN 3.5 3.8 4.0 3.9    TUMOR MARKERS: No results for input(s): "AFPTM", "CEA", "CA199", "CHROMGRNA" in the last 8760 hours.  Assessment and Plan:  Dustin Hamilton is an 82 yo gentleman presenting with severe symptoms of lower urinary tract symptoms secondary to BPH, life-style lmiting, with QoL score of 5/6.  His estimated prostate size is >150g, in the 'very large prostate' size.  Additionally there is significant intravesical protrusion of ~3.3cm.   In addition to his severe strorage symptoms, he also has symptoms of recurrent urinary tract infections and recurrent gross hematuria - both of which are thought of as indications for surgical therapy consideration.   I had a  lengthy discussion with Dustin Hamilton and his wife/family regarding the relevant anatomy of the prostate, the ubiquitous enlargement of the prostate over time/BPH (70% of 82yo), the resulting LUTS that can be attributable to the BPH, and scope of the issue, generally accepted to be about 25% of aging men with moderate/severe LUTS and decreased QoL.    We briefly discussed the role of medical therapy for treatment (such as the alpha-blockers, 5-ARI, PDE5/cialis, anti-cholinergic, beta-3-agonist) for baseline, and then traditional surgical therapies such as TURP, robotic/open prostatectomy, and also the expanding space of minimally invasive therapies.  This includes both surgical options as well as our options with prostatic artery embolization.    Regarding PAE, we went over the expanding experience of the medical community since they were first performed in 2010, the difference between the surgical goals (tissue removal or replacement) and the endovascular goals (decreased perfusion and shrinkage/ischemia), and the results of our limited randomized controlled trials and other meta-analysis.  I shared with them that PAE has been compared to TURP regarding both quantifiable measures such as PVR and flow, as well as qualitative measures such as QoL and IPSS.  Overall, TURP performs better from magnitude  of results, but both TURP and PAE result in a significant improvement of all symptoms.  Generally, PAE has been shown to improve the IPSS score reliably with a 30-50% reduction.    We also discussed the side effect profile and the risks.  Risks specific to PAE include bleeding, infection, arterial injury, contrast reaction, acute kidney injury, post-embolization syndrome, need for further surgery/procedure, non-target embolization (specifically rectum/colon, bladder, corpus), anesthesia/sedation effects, cardiopulmonary collapse, death.  Regarding the specific concern of non-target embolization of the pudendal  artery/penis, while this is theoretically a risk, all of our meta-analysis show that this complication has not been reported.    After our discussion, they would like to proceed with treatment.  I did let them know that there is a typical prodromic syndrome after embolization, "post-embolization syndrome" that is to be expected; pain, dysuria, low-grade fever, malaise.  This can sometimes lead to acute urinary retention.  In his case, I think it might be wise to consider placement of catheter before the procedure, with a voiding trial after a week, in order to avoid the possibility of acute retention in the post procedure period.    Plan: - Plan to proceed with angiogram and possible prostate artery embolization, with Dr. Earleen Newport, at [place] is to have Dustin Hamilton go to [facility] a few days before our scheduled embo in order to have a urinary catheter placed pre-op.  - Continue current care.   _________________________________________________  Annamarie Major, Bilhim TA, [place] FC, Jeralene Peters, [place] Dustin, [place], [place], Kenel TD, Kava BR, [place], [place], [place], Tam [place]. Society of Interventional Radiology Multisociety Consensus Position Statement on Prostatic Artery Embolization for Treatment of Lower Urinary Tract Symptoms Attributed to Benign Prostatic Hyperplasia: From the Society of Interventional Radiology, the Cardiovascular and Interventional Radiological Society of [place], [employer] [place], and the [employer] of Interventional Radiology: Endorsed by the [employer] of Cardiovascular and Interventional Radiology, Merchant navy officer for Interventional Radiology, Secretary/administrator, Interventional Radiology Society of [place], [place] of Interventional Radiology, and [place] of Interventional Radiology. J Vasc Interv Radiol. 2019 May;30(5):627-637.e1. doi:  10.1016/j.jvir.2019.02.013. Epub 2019 Mar 27. PMID: 58850277.   Grace Blight, Hamilton KT, Eveline Keto, Bixler BR, Dahm P, Cruz Condon, [place] MC, [address], [place] TS, Cherylann Ratel JK, [place] CG, [place], [place], [place]. Management of Lower Urinary Tract Symptoms Attributed to Benign Prostatic Hyperplasia: AUA GUIDELINE PART I-Initial Work-up and Medical Management. J Urol. 2021 Oct;206(4):806-817. doi: 10.1097/[id number]. Epub 2021 Aug 13. Erratum in: J Urol. 2021 Nov;206(5):1339. PMID: [id number].   Thank you for this interesting consult.  I greatly enjoyed meeting ZARON ZWIEFELHOFER and look forward to participating in their care.  A copy of this report was sent to the requesting provider on this date.  Electronically Signed: Corrie Mckusick 06/26/2022, 5:00 PM   I spent a total of  56 Minutes   in face to face in clinical consultation, greater than 50% of which was counseling/coordinating care for LUTS, BPH, possible angiogram and prostate artery embolization

## 2022-06-30 ENCOUNTER — Other Ambulatory Visit (HOSPITAL_COMMUNITY): Payer: Self-pay | Admitting: Interventional Radiology

## 2022-06-30 DIAGNOSIS — N400 Enlarged prostate without lower urinary tract symptoms: Secondary | ICD-10-CM

## 2022-07-01 ENCOUNTER — Other Ambulatory Visit: Payer: Self-pay

## 2022-07-01 ENCOUNTER — Emergency Department (HOSPITAL_COMMUNITY)
Admission: EM | Admit: 2022-07-01 | Discharge: 2022-07-02 | Disposition: A | Discharge status: Home / Self Care | Payer: No Typology Code available for payment source | Attending: Emergency Medicine | Admitting: Emergency Medicine

## 2022-07-01 ENCOUNTER — Encounter (HOSPITAL_COMMUNITY): Payer: Self-pay

## 2022-07-01 DIAGNOSIS — Z79899 Other long term (current) drug therapy: Secondary | ICD-10-CM | POA: Diagnosis not present

## 2022-07-01 DIAGNOSIS — R531 Weakness: Secondary | ICD-10-CM | POA: Diagnosis not present

## 2022-07-01 DIAGNOSIS — Z87891 Personal history of nicotine dependence: Secondary | ICD-10-CM | POA: Insufficient documentation

## 2022-07-01 DIAGNOSIS — F0390 Unspecified dementia without behavioral disturbance: Secondary | ICD-10-CM | POA: Diagnosis present

## 2022-07-01 DIAGNOSIS — G9341 Metabolic encephalopathy: Secondary | ICD-10-CM | POA: Diagnosis present

## 2022-07-01 DIAGNOSIS — Z1152 Encounter for screening for COVID-19: Secondary | ICD-10-CM | POA: Insufficient documentation

## 2022-07-01 DIAGNOSIS — Z7982 Long term (current) use of aspirin: Secondary | ICD-10-CM | POA: Diagnosis not present

## 2022-07-01 DIAGNOSIS — R4182 Altered mental status, unspecified: Secondary | ICD-10-CM | POA: Diagnosis not present

## 2022-07-01 DIAGNOSIS — I10 Essential (primary) hypertension: Secondary | ICD-10-CM | POA: Diagnosis not present

## 2022-07-01 DIAGNOSIS — Z8673 Personal history of transient ischemic attack (TIA), and cerebral infarction without residual deficits: Secondary | ICD-10-CM | POA: Diagnosis not present

## 2022-07-01 DIAGNOSIS — R509 Fever, unspecified: Secondary | ICD-10-CM | POA: Diagnosis not present

## 2022-07-01 DIAGNOSIS — R410 Disorientation, unspecified: Secondary | ICD-10-CM | POA: Diagnosis not present

## 2022-07-01 DIAGNOSIS — I471 Supraventricular tachycardia, unspecified: Secondary | ICD-10-CM | POA: Diagnosis present

## 2022-07-01 NOTE — ED Triage Notes (Signed)
Wife said that the patient has been more confused today and not able to ambulate with his walker like he normally does. Michela Pitcher he has been running a fever for the last 2 days. Michela Pitcher he has a hx of getting uti's and he acts this way with them.

## 2022-07-01 NOTE — ED Provider Notes (Incomplete)
[facility] DEPT Provider Note: Dustin Spurling, MD, FACEP  CSN: [id number] MRN: [id number] ARRIVAL: 07/01/22 at 2234 ROOM: WA14/WA14   CHIEF COMPLAINT  Weakness  Level 5 caveat: Confusion HISTORY OF PRESENT ILLNESS  07/01/22 11:37 PM Dustin Hamilton is a 82 y.o. male who was sent to the ED by his wife.  EMS reports that the patient has been more confused today and too weak to ambulate with his walker.  He has had a fever for the past 2 days.  He has a history of urinary tract infections and this is a typical presentation for when he has one.  The patient himself does not know why he is here except to tell me that his wife had absent.  He admits to being weak but denies any pain or other complaints.  Has a history of stroke and memory issues.   Past Medical History:  Diagnosis Date   BPH (benign prostatic hyperplasia)    Cerebrovascular disease    Clostridium difficile diarrhea    Congenital anomaly of diaphragm    Elevated PSA    Glaucoma, both eyes    Hemorrhoid    Hepatitis B surface antigen positive    02-20-2011   History of adenomatous polyp of colon    2007, 2009 and 2013  tubular adenoma's   History of alcohol abuse    quit 1963   History of cerebral parenchymal hemorrhage    01/ 2006  left occiptial lobe related to hypertensive crisis   History of CVA (cerebrovascular accident)    09-12-2012  left hippocampus/ amygdala junction and per MRI old white matter infarcts--  per pt residual short- term memory issues   History of fatty infiltration of liver hx visit's at [facility] , last visit 05/ 2014   elvated LFT's ,  via liver bx 2004 related to hx alcohol and drug abuse (quit 1964)   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   HTN (hypertension)    Renal cyst, left    Stroke ([place])    hx of 3 strokes in past    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    Urethral lesion    urethral mass    Past  Surgical History:  Procedure Laterality Date   CARDIOVASCULAR STRESS TEST  05/05/2007   normal nuclear study w/ no ischemia/  normal LV fucntion and wall motion , ef60%   COLONOSCOPY  last one 04-06-2012   CYSTO/  LEFT RETROGRADE PYELOGRAM/ CYTOLOGY WASHINGS/  URETEROSCOPY  03/05/2000   INGUINAL HERNIA REPAIR Bilateral 1965 and 1980's   IR RADIOLOGIST EVAL & MGMT  06/26/2022   LAPAROSCOPIC INGUINAL HERNIA WITH UMBILICAL HERNIA Right 63/14/9702   LIVER BIOPSY  1980's and 2004   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   Procedure: PACEMAKER IMPLANT;  Surgeon: Evans Lance, MD;  Location: [facility];  Service: Cardiovascular;  Laterality: N/A;   SVT ABLATION N/A 11/15/2018   Procedure: SVT ABLATION;  Surgeon: Evans Lance, MD;  Location: [facility];  Service: Cardiovascular;  Laterality: N/A;   TRANSTHORACIC ECHOCARDIOGRAM  09/13/2012   moderate LVH,  ef 60-65%/     TRANSURETHRAL RESECTION OF BLADDER TUMOR N/A 08/11/2016   Procedure: TRANSURETHRAL RESECTION OF BLADDER TUMOR (TURBT);  Surgeon: Cleon Gustin, MD;  Location: [facility];  Service: Urology;  Laterality: N/A;    Family History  Problem Relation Age of Onset  Rheum arthritis Mother    Diabetes Mother    Stroke Mother    Heart attack Mother    Kidney failure Mother    Heart attack Father    Heart disease Maternal Grandmother    Rheum arthritis Maternal Grandmother    Diabetes Maternal Grandmother    Stroke Maternal Grandmother    Colon cancer Neg Hx     Social History   Tobacco Use   Smoking status: Former    Packs/day: 1.00    Years: 5.00    Total pack years: 5.00    Types: Cigarettes    Quit date: 11/30/1981    Years since quitting: 40.6   Smokeless tobacco: Never  Vaping Use   Vaping Use: Never used  Substance Use Topics   Alcohol use: No    Alcohol/week: 0.0 standard drinks of alcohol    Comment: hx abuse -- quit:  1963   Drug use: No    Comment: hx abuse -- quit 1964 (iv  heroin and cocaine    Prior to Admission medications   Medication Sig Start Date End Date Taking? Authorizing Provider  acetaminophen (TYLENOL) 325 MG tablet Take 2 tablets (650 mg total) by mouth every 6 (six) hours as needed for moderate pain. 02/03/22   Jaynee Eagles, PA-C  allopurinol (ZYLOPRIM) 100 MG tablet Take 1 tablet (100 mg total) by mouth daily. 04/05/21 04/05/22  Pokhrel, Corrie Mckusick, MD  amLODipine (NORVASC) 2.5 MG tablet Take 2.5 mg by mouth daily.    [provider]  aspirin 81 MG EC tablet Take 1 tablet (81 mg total) by mouth daily. Swallow whole. 09/06/21   Raiford Noble Latif, DO  atorvastatin (LIPITOR) 40 MG tablet Take 0.5 tablets (20 mg total) by mouth daily. 09/06/21   Raiford Noble Latif, DO  brimonidine (ALPHAGAN) 0.2 % ophthalmic solution Place 1 drop into both eyes 2 (two) times daily.    [provider]  cefdinir (OMNICEF) 300 MG capsule Take 1 capsule (300 mg total) by mouth 2 (two) times daily. 03/10/22   Margarita Mail, PA-C  colchicine 0.6 MG tablet Take 1 tablet (0.6 mg total) by mouth daily. 08/25/21   Horton, Alvin Critchley, DO  diclofenac Sodium (VOLTAREN) 1 % GEL Apply 4 g topically 4 (four) times daily. Patient taking differently: Apply 4 g topically daily as needed (pain). 06/05/21   Deno Etienne, DO  dutasteride (AVODART) 0.5 MG capsule Take 0.5 mg by mouth daily.    [provider]  fidaxomicin (DIFICID) 200 MG TABS tablet Take 1 tablet (200 mg total) by mouth 2 (two) times daily. Patient taking differently: Take 200 mg by mouth See admin instructions. Start date : 09/03/21. Take 200 mg 2 times a day for 5 days then take 200 mg every other day for 20 days. 07/29/21   Kommor, Madison, MD  latanoprost (XALATAN) 0.005 % ophthalmic solution Place 1 drop into both eyes at bedtime.     [provider]  lidocaine (LIDODERM) 5 % Place 1 patch onto the skin daily as needed (back pain). Remove & Discard patch within 12 hours or as directed by MD     [provider]  memantine (NAMENDA) 10 MG tablet Take 20 mg by mouth at bedtime.    [provider]  methocarbamol (ROBAXIN) 500 MG tablet Take 1 tablet (500 mg total) by mouth 2 (two) times daily. Patient not taking: Reported on 09/04/2021 06/05/21   Deno Etienne, DO  metoprolol succinate (TOPROL-XL) 25 MG 24 hr tablet  Take 0.5 tablets (12.5 mg total) by mouth at bedtime. 05/31/20   Lilland, Alana, DO  Naphazoline HCl (CLEAR EYES OP) Place 2 drops into both eyes daily as needed (dry eyes).    [provider]  Nutritional Supplements ([place] PO) Take 237 mLs by mouth daily as needed (appetite, nutrition). Clear/apple    [provider]  polyethylene glycol (MIRALAX / GLYCOLAX) 17 g packet Take 17 g by mouth daily. 05/01/22   Drenda Freeze, MD  tamsulosin (FLOMAX) 0.4 MG CAPS capsule Take 0.4 mg by mouth at bedtime.    [provider]  tiZANidine (ZANAFLEX) 4 MG tablet Take 4 mg by mouth 2 (two) times daily as needed for muscle spasms. 06/11/21   [provider]    Allergies Aricept [donepezil], Levetiracetam, Penicillins, and Viagra [sildenafil]   REVIEW OF SYSTEMS  Level 5 caveat   PHYSICAL EXAMINATION  Initial Vital Signs Blood pressure (!) 165/89, pulse 62, temperature 97.6 F (36.4 C), resp. rate 19, height '5\' 11"'$  (1.803 m), weight 69.9 kg, SpO2 98 %.  Examination General: Well-developed, well-nourished male in no acute distress; appearance consistent with age of record HENT: normocephalic; atraumatic Eyes: pupils equal, round and reactive to light; extraocular muscles grossly intact; arcus senilis bilaterally Neck: supple Heart: regular rate and rhythm Lungs: clear to auscultation bilaterally Abdomen: soft; nondistended; nontender; bowel sounds present Extremities: No deformity; full range of motion; pulses normal Neurologic: Awake, alert and oriented x 2; motor function intact in all extremities and  symmetric; no facial droop Skin: Warm and dry Psychiatric: Normal mood and affect   RESULTS  Summary of this visit's results, reviewed and interpreted by myself:   EKG Interpretation  Date/Time:    Ventricular Rate:    PR Interval:    QRS Duration:   QT Interval:    QTC Calculation:   R Axis:     Text Interpretation:         Laboratory Studies: Results for orders placed or performed during the hospital encounter of 07/01/22 (from the past 24 hour(s))  CBC with Differential     Status: Abnormal   Collection Time: 07/01/22 10:57 PM  Result Value Ref Range   WBC 4.5 4.0 - 10.5 K/uL   RBC 5.21 4.22 - 5.81 MIL/uL   Hemoglobin 16.4 13.0 - 17.0 g/dL   HCT 49.2 39.0 - 52.0 %   MCV 94.4 80.0 - 100.0 fL   MCH 31.5 26.0 - 34.0 pg   MCHC 33.3 30.0 - 36.0 g/dL   RDW 13.2 11.5 - 15.5 %   Platelets 122 (L) 150 - 400 K/uL   nRBC 0.0 0.0 - 0.2 %   Neutrophils Relative % 61 %   Neutro Abs 2.7 1.7 - 7.7 K/uL   Lymphocytes Relative 29 %   Lymphs Abs 1.3 0.7 - 4.0 K/uL   Monocytes Relative 7 %   Monocytes Absolute 0.3 0.1 - 1.0 K/uL   Eosinophils Relative 3 %   Eosinophils Absolute 0.1 0.0 - 0.5 K/uL   Basophils Relative 0 %   Basophils Absolute 0.0 0.0 - 0.1 K/uL   Immature Granulocytes 0 %   Abs Immature Granulocytes 0.01 0.00 - 0.07 K/uL  Basic metabolic panel     Status: Abnormal   Collection Time: 07/01/22 10:57 PM  Result Value Ref Range   Sodium 140 135 - 145 mmol/L   Potassium 3.7 3.5 - 5.1 mmol/L   Chloride 107 98 - 111 mmol/L   CO2  26 22 - 32 mmol/L   Glucose, Bld 104 (H) 70 - 99 mg/dL   BUN 15 8 - 23 mg/dL   Creatinine, Ser 0.87 0.61 - 1.24 mg/dL   Calcium 9.8 8.9 - 10.3 mg/dL   GFR, Estimated >60 >60 mL/min   Anion gap 7 5 - 15  Urinalysis, Routine w reflex microscopic -Urine, Clean Catch     Status: Abnormal   Collection Time: 07/01/22 11:00 PM  Result Value Ref Range   Color, Urine COLORLESS (A) YELLOW   APPearance CLEAR CLEAR   Specific Gravity, Urine  1.004 (L) 1.005 - 1.030   pH 7.0 5.0 - 8.0   Glucose, UA NEGATIVE NEGATIVE mg/dL   Hgb urine dipstick NEGATIVE NEGATIVE   Bilirubin Urine NEGATIVE NEGATIVE   Ketones, ur NEGATIVE NEGATIVE mg/dL   Protein, ur NEGATIVE NEGATIVE mg/dL   Nitrite NEGATIVE NEGATIVE   Leukocytes,Ua SMALL (A) NEGATIVE   RBC / HPF 0-5 0 - 5 RBC/hpf   WBC, UA 0-5 0 - 5 WBC/hpf   Bacteria, UA NONE SEEN NONE SEEN   Squamous Epithelial / HPF 0-5 0 - 5 /HPF   Mucus PRESENT   Resp panel by RT-PCR (RSV, Flu A&B, Covid) Anterior Nasal Swab     Status: None   Collection Time: 07/02/22 12:01 AM   Specimen: Anterior Nasal Swab  Result Value Ref Range   SARS Coronavirus 2 by RT PCR NEGATIVE NEGATIVE   Influenza A by PCR NEGATIVE NEGATIVE   Influenza B by PCR NEGATIVE NEGATIVE   Resp Syncytial Virus by PCR NEGATIVE NEGATIVE   Imaging Studies: DG Chest Port 1 View  Result Date: 07/02/2022 CLINICAL DATA:  Fever and altered mental status EXAM: PORTABLE CHEST 1 VIEW COMPARISON:  04/13/2022 FINDINGS: Right lower lobe atelectasis or consolidation. No pleural effusion or pneumothorax. Elevated right hemidiaphragm. Stable cardiomediastinal silhouette. Aortic atherosclerotic calcification. Left chest wall pacemaker. IMPRESSION: Elevated right hemidiaphragm with right lower lobe atelectasis or consolidation. Electronically Signed   By: Placido Sou M.D.   On: 07/02/2022 03:33   CT Head Wo Contrast  Result Date: 07/02/2022 CLINICAL DATA:  Delirium EXAM: CT HEAD WITHOUT CONTRAST TECHNIQUE: Contiguous axial images were obtained from the base of the skull through the vertex without intravenous contrast. RADIATION DOSE REDUCTION: This exam was performed according to the departmental dose-optimization program which includes automated exposure control, adjustment of the mA and/or kV according to patient size and/or use of iterative reconstruction technique. COMPARISON:  05/01/2022 FINDINGS: Brain: There is no mass, hemorrhage or  extra-axial collection. There is generalized atrophy without lobar predilection. Hypodensity of the white matter is most commonly associated with chronic microvascular disease. Vascular: Atherosclerotic calcification of the vertebral and internal carotid arteries at the skull base. No abnormal hyperdensity of the major intracranial arteries or dural venous sinuses. Skull: The visualized skull base, calvarium and extracranial soft tissues are normal. Sinuses/Orbits: No fluid levels or advanced mucosal thickening of the visualized paranasal sinuses. No mastoid or middle ear effusion. The orbits are normal. IMPRESSION: 1. No acute intracranial abnormality. 2. Generalized atrophy and findings of chronic microvascular disease. Electronically Signed   By: Ulyses Jarred M.D.   On: 07/02/2022 02:15    ED COURSE and MDM  Nursing notes, initial and subsequent vitals signs, including pulse oximetry, reviewed and interpreted by myself.  Vitals:   07/02/22 0045 07/02/22 0100 07/02/22 0145 07/02/22 0330  BP: 123/76 (!) 139/91 (!) 146/85 129/86  Pulse: (!) 59 (!) 51 60 (!) 59  Resp: 14  $'13 14 12  'E$ Temp:   97.6 F (36.4 C) 97.6 F (36.4 C)  SpO2: 98% 98% 97% 96%  Weight:      Height:       Medications - No data to display  3:14 AM The cause of the patient's acute weakness and confusion is unclear.  He has not been febrile while in the ED but was reportedly febrile at home.  He may have a viral illness.  Start antibiotics for possible right lower lobe pneumonia although clinically he does not appear to have any respiratory difficulty or symptoms.  3:52 AM Dr. Claria Dice to admit to the hospitalist service.  4:49 AM Dr. Claria Dice has eluate the patient.  His confusion has significantly improved and he is able to ambulate using a walker without difficulty.  She does not believe he meets admission criteria at the present time.  We will discharge him home with oral antibiotics for possible pneumonia.  PROCEDURES   Procedures   ED DIAGNOSES     ICD-10-CM   1. Generalized weakness  R53.1     2. Confusion  R41.0          Teodora Baumgarten, Jenny Reichmann, MD 07/02/22 0352    Shanon Rosser, MD 07/02/22 [phone]

## 2022-07-02 ENCOUNTER — Emergency Department (HOSPITAL_COMMUNITY): Payer: No Typology Code available for payment source

## 2022-07-02 DIAGNOSIS — R4182 Altered mental status, unspecified: Secondary | ICD-10-CM | POA: Diagnosis not present

## 2022-07-02 DIAGNOSIS — R410 Disorientation, unspecified: Secondary | ICD-10-CM | POA: Diagnosis not present

## 2022-07-02 DIAGNOSIS — F0390 Unspecified dementia without behavioral disturbance: Secondary | ICD-10-CM | POA: Diagnosis present

## 2022-07-02 DIAGNOSIS — R509 Fever, unspecified: Secondary | ICD-10-CM | POA: Diagnosis not present

## 2022-07-02 DIAGNOSIS — R531 Weakness: Secondary | ICD-10-CM

## 2022-07-02 LAB — CBC WITH DIFFERENTIAL/PLATELET
Abs Immature Granulocytes: 0.01 10*3/uL (ref 0.00–0.07)
Basophils Absolute: 0 10*3/uL (ref 0.0–0.1)
Basophils Relative: 0 %
Eosinophils Absolute: 0.1 10*3/uL (ref 0.0–0.5)
Eosinophils Relative: 3 %
HCT: 49.2 % (ref 39.0–52.0)
Hemoglobin: 16.4 g/dL (ref 13.0–17.0)
Immature Granulocytes: 0 %
Lymphocytes Relative: 29 %
Lymphs Abs: 1.3 10*3/uL (ref 0.7–4.0)
MCH: 31.5 pg (ref 26.0–34.0)
MCHC: 33.3 g/dL (ref 30.0–36.0)
MCV: 94.4 fL (ref 80.0–100.0)
Monocytes Absolute: 0.3 10*3/uL (ref 0.1–1.0)
Monocytes Relative: 7 %
Neutro Abs: 2.7 10*3/uL (ref 1.7–7.7)
Neutrophils Relative %: 61 %
Platelets: 122 10*3/uL — ABNORMAL LOW (ref 150–400)
RBC: 5.21 MIL/uL (ref 4.22–5.81)
RDW: 13.2 % (ref 11.5–15.5)
WBC: 4.5 10*3/uL (ref 4.0–10.5)
nRBC: 0 % (ref 0.0–0.2)

## 2022-07-02 LAB — URINALYSIS, ROUTINE W REFLEX MICROSCOPIC
Bacteria, UA: NONE SEEN
Bilirubin Urine: NEGATIVE
Glucose, UA: NEGATIVE mg/dL
Hgb urine dipstick: NEGATIVE
Ketones, ur: NEGATIVE mg/dL
Nitrite: NEGATIVE
Protein, ur: NEGATIVE mg/dL
Specific Gravity, Urine: 1.004 — ABNORMAL LOW (ref 1.005–1.030)
pH: 7 (ref 5.0–8.0)

## 2022-07-02 LAB — BASIC METABOLIC PANEL
Anion gap: 7 (ref 5–15)
BUN: 15 mg/dL (ref 8–23)
CO2: 26 mmol/L (ref 22–32)
Calcium: 9.8 mg/dL (ref 8.9–10.3)
Chloride: 107 mmol/L (ref 98–111)
Creatinine, Ser: 0.87 mg/dL (ref 0.61–1.24)
GFR, Estimated: >60 mL/min (ref >60)
Glucose, Bld: 104 mg/dL — ABNORMAL HIGH (ref 70–99)
Potassium: 3.7 mmol/L (ref 3.5–5.1)
Sodium: 140 mmol/L (ref 135–145)

## 2022-07-02 LAB — RESP PANEL BY RT-PCR (RSV, FLU A&B, COVID)  RVPGX2
Influenza A by PCR: NEGATIVE
Influenza B by PCR: NEGATIVE
Resp Syncytial Virus by PCR: NEGATIVE
SARS Coronavirus 2 by RT PCR: NEGATIVE

## 2022-07-02 MED ORDER — SODIUM CHLORIDE 0.9 % IV SOLN
500.0000 mg | Freq: Once | INTRAVENOUS | Status: AC
  Administered 2022-07-02: 500 mg via INTRAVENOUS
  Filled 2022-07-02: qty 5

## 2022-07-02 MED ORDER — DOXYCYCLINE HYCLATE 100 MG PO CAPS: 100.0000 mg | ORAL_CAPSULE | Freq: Two times a day (BID) | ORAL | 0 refills | Status: DC

## 2022-07-02 MED ORDER — SODIUM CHLORIDE 0.9 % IV SOLN
1.0000 g | Freq: Once | INTRAVENOUS | Status: AC
  Administered 2022-07-02: 1 g via INTRAVENOUS
  Filled 2022-07-02: qty 10

## 2022-07-03 ENCOUNTER — Emergency Department (HOSPITAL_COMMUNITY)
Admission: EM | Admit: 2022-07-03 | Discharge: 2022-07-03 | Disposition: A | Discharge status: Home / Self Care | Payer: No Typology Code available for payment source | Attending: Emergency Medicine | Admitting: Emergency Medicine

## 2022-07-03 DIAGNOSIS — Z79899 Other long term (current) drug therapy: Secondary | ICD-10-CM | POA: Diagnosis not present

## 2022-07-03 DIAGNOSIS — Z7982 Long term (current) use of aspirin: Secondary | ICD-10-CM | POA: Insufficient documentation

## 2022-07-03 DIAGNOSIS — I480 Paroxysmal atrial fibrillation: Secondary | ICD-10-CM | POA: Diagnosis not present

## 2022-07-03 DIAGNOSIS — Z8579 Personal history of other malignant neoplasms of lymphoid, hematopoietic and related tissues: Secondary | ICD-10-CM | POA: Insufficient documentation

## 2022-07-03 DIAGNOSIS — I10 Essential (primary) hypertension: Secondary | ICD-10-CM | POA: Insufficient documentation

## 2022-07-03 DIAGNOSIS — F0390 Unspecified dementia without behavioral disturbance: Secondary | ICD-10-CM | POA: Insufficient documentation

## 2022-07-03 DIAGNOSIS — Z8673 Personal history of transient ischemic attack (TIA), and cerebral infarction without residual deficits: Secondary | ICD-10-CM | POA: Insufficient documentation

## 2022-07-03 DIAGNOSIS — R531 Weakness: Secondary | ICD-10-CM | POA: Diagnosis present

## 2022-07-03 LAB — URINALYSIS, ROUTINE W REFLEX MICROSCOPIC
Bacteria, UA: NONE SEEN
Bilirubin Urine: NEGATIVE
Glucose, UA: NEGATIVE mg/dL
Hgb urine dipstick: NEGATIVE
Ketones, ur: NEGATIVE mg/dL
Nitrite: NEGATIVE
Protein, ur: 30 mg/dL — AB
Specific Gravity, Urine: 1.015 (ref 1.005–1.030)
pH: 5 (ref 5.0–8.0)

## 2022-07-03 LAB — CBC WITH DIFFERENTIAL/PLATELET
Abs Immature Granulocytes: 0.02 10*3/uL (ref 0.00–0.07)
Basophils Absolute: 0 10*3/uL (ref 0.0–0.1)
Basophils Relative: 1 %
Eosinophils Absolute: 0.1 10*3/uL (ref 0.0–0.5)
Eosinophils Relative: 2 %
HCT: 48.4 % (ref 39.0–52.0)
Hemoglobin: 15.8 g/dL (ref 13.0–17.0)
Immature Granulocytes: 0 %
Lymphocytes Relative: 24 %
Lymphs Abs: 1.5 10*3/uL (ref 0.7–4.0)
MCH: 31 pg (ref 26.0–34.0)
MCHC: 32.6 g/dL (ref 30.0–36.0)
MCV: 95.1 fL (ref 80.0–100.0)
Monocytes Absolute: 0.5 10*3/uL (ref 0.1–1.0)
Monocytes Relative: 8 %
Neutro Abs: 4.2 10*3/uL (ref 1.7–7.7)
Neutrophils Relative %: 65 %
Platelets: 144 10*3/uL — ABNORMAL LOW (ref 150–400)
RBC: 5.09 MIL/uL (ref 4.22–5.81)
RDW: 13.5 % (ref 11.5–15.5)
WBC: 6.4 10*3/uL (ref 4.0–10.5)
nRBC: 0 % (ref 0.0–0.2)

## 2022-07-03 LAB — BASIC METABOLIC PANEL
Anion gap: 9 (ref 5–15)
BUN: 18 mg/dL (ref 8–23)
CO2: 25 mmol/L (ref 22–32)
Calcium: 9.6 mg/dL (ref 8.9–10.3)
Chloride: 107 mmol/L (ref 98–111)
Creatinine, Ser: 1.15 mg/dL (ref 0.61–1.24)
GFR, Estimated: >60 mL/min (ref >60)
Glucose, Bld: 107 mg/dL — ABNORMAL HIGH (ref 70–99)
Potassium: 3.8 mmol/L (ref 3.5–5.1)
Sodium: 141 mmol/L (ref 135–145)

## 2022-07-03 LAB — TROPONIN I (HIGH SENSITIVITY)
Troponin I (High Sensitivity): 16 ng/L (ref <18)
Troponin I (High Sensitivity): 18 ng/L — ABNORMAL HIGH (ref <18)

## 2022-07-03 LAB — LACTIC ACID, PLASMA
Lactic Acid, Venous: 1.9 mmol/L (ref 0.5–1.9)
Lactic Acid, Venous: 1.7 mmol/L (ref 0.5–1.9)

## 2022-07-03 MED ORDER — SODIUM CHLORIDE 0.9 % IV BOLUS
500.0000 mL | Freq: Once | INTRAVENOUS | Status: AC
  Administered 2022-07-03: 500 mL via INTRAVENOUS

## 2022-07-03 NOTE — ED Provider Notes (Signed)
[place] EMERGENCY DEPARTMENT AT [facility] Provider Note   CSN: [id number] Arrival date & time: 07/03/22  1531     History  No chief complaint on file.   Dustin Hamilton is a 82 y.o. male.  HPI    82 year old male comes in with chief complaint of weakness. Patient has history of PSVT, TIA, hypertension, dementia.  Patient comes from oncology clinic because of low blood pressure.  Patient is feeling well right now, and wants to go home.  He states that he was feeling fine and went to the oncology visit where they noted that his blood pressure was in the 80s and he was sent to the emergency room.  He wants to now go home.  I called patient's wife and spoke with her.  She states that patient was seen in the ER 2 days ago because of weakness and fall.  His workup was reassuring and he was discharged.  He has been feeling weak compared to baseline while at home.  There has not been any fevers, chills, nausea, vomiting, UTI-like symptoms.  Patient was feeling well and they went to the pain clinic where patient started feeling hot, dizzy and he nearly passed out.  His blood pressure was low and he was sent to the emergency room.  Patient does not have any known cardiac disease history.  No history of blood clots in his legs or lungs.  Home Medications Prior to Admission medications   Medication Sig Start Date End Date Taking? Authorizing Provider  acetaminophen (TYLENOL) 325 MG tablet Take 2 tablets (650 mg total) by mouth every 6 (six) hours as needed for moderate pain. 02/03/22  Yes Jaynee Eagles, PA-C  allopurinol (ZYLOPRIM) 100 MG tablet Take 1 tablet (100 mg total) by mouth daily. 04/05/21 07/03/22 Yes Pokhrel, Laxman, MD  amLODipine (NORVASC) 2.5 MG tablet Take 2.5 mg by mouth daily.   Yes [provider]  brimonidine (ALPHAGAN) 0.2 % ophthalmic solution Place 1 drop into both eyes 2 (two) times daily.   Yes [provider]  diclofenac Sodium (VOLTAREN) 1  % GEL Apply 4 g topically 4 (four) times daily. Patient taking differently: Apply 4 g topically daily as needed (to affected sites- for pain). 06/05/21  Yes Deno Etienne, DO  dutasteride (AVODART) 0.5 MG capsule Take 0.5 mg by mouth daily.   Yes [provider]  latanoprost (XALATAN) 0.005 % ophthalmic solution Place 1 drop into both eyes at bedtime.    Yes [provider]  lidocaine (LIDODERM) 5 % Place 1 patch onto the skin daily as needed (back pain). Remove & Discard patch within 12 hours or as directed by MD   Yes [provider]  memantine (NAMENDA) 10 MG tablet Take 20 mg by mouth in the morning and at bedtime.   Yes [provider]  metoprolol succinate (TOPROL-XL) 25 MG 24 hr tablet Take 0.5 tablets (12.5 mg total) by mouth at bedtime. 05/31/20  Yes Lilland, Alana, DO  Naphazoline HCl (CLEAR EYES OP) Place 2 drops into both eyes daily as needed (dry eyes).   Yes [provider]  Nutritional Supplements ([place] PO) Take 237 mLs by mouth daily as needed (appetite, nutrition). Clear/apple   Yes [provider]  polyethylene glycol (MIRALAX / GLYCOLAX) 17 g packet Take 17 g by mouth daily. Patient taking differently: Take 17 g by mouth daily as needed for mild constipation. 05/01/22  Yes Drenda Freeze, MD  tamsulosin [facility])  0.4 MG CAPS capsule Take 0.4 mg by mouth at bedtime.   Yes [provider]  tiZANidine (ZANAFLEX) 4 MG tablet Take 4 mg by mouth 2 (two) times daily as needed for muscle spasms. 06/11/21  Yes [provider]  aspirin 81 MG EC tablet Take 1 tablet (81 mg total) by mouth daily. Swallow whole. Patient not taking: Reported on 07/03/2022 09/06/21   Raiford Noble Latif, DO  atorvastatin (LIPITOR) 40 MG tablet Take 0.5 tablets (20 mg total) by mouth daily. Patient not taking: Reported on 07/03/2022 09/06/21   Raiford Noble Latif, DO  colchicine 0.6 MG tablet Take 1 tablet (0.6 mg total) by mouth  daily. Patient not taking: Reported on 07/03/2022 08/25/21   Horton, Alvin Critchley, DO  doxycycline (VIBRAMYCIN) 100 MG capsule Take 1 capsule (100 mg total) by mouth 2 (two) times daily. One po bid x 7 days Patient not taking: Reported on 07/03/2022 07/02/22   Molpus, Jenny Reichmann, MD      Allergies    Aricept [donepezil], Levetiracetam, Penicillins, and Viagra [sildenafil]    Review of Systems   Review of Systems  All other systems reviewed and are negative.   Physical Exam Updated Vital Signs BP (!) 138/92   Pulse 72   Temp 97.8 F (36.6 C) (Oral)   Resp 15   SpO2 95%  Physical Exam Vitals and nursing note reviewed.  Constitutional:      Appearance: He is well-developed.  HENT:     Head: Atraumatic.  Eyes:     Extraocular Movements: Extraocular movements intact.     Pupils: Pupils are equal, round, and reactive to light.  Cardiovascular:     Rate and Rhythm: Normal rate.  Pulmonary:     Effort: Pulmonary effort is normal.  Musculoskeletal:     Cervical back: Neck supple.  Skin:    General: Skin is warm.  Neurological:     Mental Status: He is alert and oriented to person, place, and time.     Cranial Nerves: No cranial nerve deficit.     Sensory: No sensory deficit.     Motor: No weakness.  Psychiatric:        Mood and Affect: Mood normal.        Behavior: Behavior normal.     ED Results / Procedures / Treatments   Labs (all labs ordered are listed, but only abnormal results are displayed) Labs Reviewed  BASIC METABOLIC PANEL - Abnormal; Notable for the following components:      Result Value   Glucose, Bld 107 (*)    All other components within normal limits  CBC WITH DIFFERENTIAL/PLATELET - Abnormal; Notable for the following components:   Platelets 144 (*)    All other components within normal limits  URINALYSIS, ROUTINE W REFLEX MICROSCOPIC - Abnormal; Notable for the following components:   Protein, ur 30 (*)    Leukocytes,Ua MODERATE (*)    All other components  within normal limits  TROPONIN I (HIGH SENSITIVITY) - Abnormal; Notable for the following components:   Troponin I (High Sensitivity) 18 (*)    All other components within normal limits  LACTIC ACID, PLASMA  LACTIC ACID, PLASMA  TROPONIN I (HIGH SENSITIVITY)    EKG EKG Interpretation  Date/Time:  Thursday July 03 2022 16:35:54 EST Ventricular Rate:  94 PR Interval:    QRS Duration: 129 QT Interval:  392 QTC Calculation: 464 R Axis:   -68 Text Interpretation: Atrial fibrillation Nonspecific IVCD with LAD afib is new  compared to previous ekg Confirmed by Varney Biles [id number]) on 07/03/2022 8:39:15 PM            Radiology DG Chest Port 1 View  Result Date: 07/02/2022 CLINICAL DATA:  Fever and altered mental status EXAM: PORTABLE CHEST 1 VIEW COMPARISON:  04/13/2022 FINDINGS: Right lower lobe atelectasis or consolidation. No pleural effusion or pneumothorax. Elevated right hemidiaphragm. Stable cardiomediastinal silhouette. Aortic atherosclerotic calcification. Left chest wall pacemaker. IMPRESSION: Elevated right hemidiaphragm with right lower lobe atelectasis or consolidation. Electronically Signed   By: Placido Sou M.D.   On: 07/02/2022 03:33   CT Head Wo Contrast  Result Date: 07/02/2022 CLINICAL DATA:  [place] EXAM: CT HEAD WITHOUT CONTRAST TECHNIQUE: Contiguous axial images were obtained from the base of the skull through the vertex without intravenous contrast. RADIATION DOSE REDUCTION: This exam was performed according to the departmental dose-optimization program which includes automated exposure control, adjustment of the mA and/or kV according to patient size and/or use of iterative reconstruction technique. COMPARISON:  05/01/2022 FINDINGS: Brain: There is no mass, hemorrhage or extra-axial collection. There is generalized atrophy without lobar predilection. Hypodensity of the white matter is most commonly associated with chronic microvascular disease. Vascular:  Atherosclerotic calcification of the vertebral and internal carotid arteries at the skull base. No abnormal hyperdensity of the major intracranial arteries or dural venous sinuses. Skull: The visualized skull base, calvarium and extracranial soft tissues are normal. Sinuses/Orbits: No fluid levels or advanced mucosal thickening of the visualized paranasal sinuses. No mastoid or middle ear effusion. The orbits are normal. IMPRESSION: 1. No acute intracranial abnormality. 2. Generalized atrophy and findings of chronic microvascular disease. Electronically Signed   By: Ulyses Jarred M.D.   On: 07/02/2022 02:15    Procedures Procedures    Medications Ordered in ED Medications  sodium chloride 0.9 % bolus 500 mL (0 mLs Intravenous Stopped 07/03/22 2222)    ED Course/ Medical Decision Making/ A&P                             Medical Decision Making Amount and/or Complexity of Data Reviewed Labs: ordered.   82 year old male with history of TIA, multiple myeloma, hypertension and SVT comes in with chief complaint of low blood pressure and near syncope.  Patient has no cardiac disease history.  It appears that patient was seen in the ER for weakness and fall 2 days ago.  Subsequently he has been able to function at home, but not at his usual capacity.  Differential diagnosis considered includes acute coronary syndrome, occult infection including UTI, COVID-19-flu, electrolyte abnormality, renal failure, medication side effects.  Substantial part of the history was provided by patient's wife and I also reviewed patient's previous ED encounters, previous CT scans.  Patient wants to go home and not stay in the hospital.  Wife states that patient was able to function well at home, but he is not at his baseline normal.  He was doing fine when they went to the clinic, but deteriorated while over there.  We will place patient on telemetry monitoring and reassess.  10:47 PM I reviewed patient's telemetry  monitoring.  Patient has had 3 events of irregular heartbeat consistent with A-fib.  I discussed the results with the patient.  He states he is feeling better, wants to go home.  Wife at the bedside now.  I discussed that patient likely has A-fib based on telemetry monitoring.  Patient has been rate controlled the  entire time here and his blood pressure has been stable ever since he got here.  Patient had single blood pressure that was documented low at arrival, but the BP per EMS at the time of handoff and all the blood pressure since then have been fine.  Patient also has not had any RVR events.  I informed the patient that his generalized weakness is likely because of A-fib.  I discussed with him anticoagulation to reduce risk for stroke.  Patient and the wife slightly apprehensive about starting anticoagulation right away.  I discussed with them that they can follow-up with A-fib clinic, who can help decide on the next best step.  Patient and the wife are comfortable with the plan.  Advised that they come back to the emergency room if the weakness gets worse.  Advised that they be very careful with any kind of movement around the house to prevent falls.  Final Clinical Impression(s) / ED Diagnoses Final diagnoses:  Paroxysmal atrial fibrillation [facility])    Rx / DC Orders ED Discharge Orders          Ordered    Amb referral to AFIB Clinic        07/03/22 [place], Bevely Hackbart, MD 07/03/22 2257

## 2022-07-03 NOTE — Discharge Instructions (Addendum)
The weakness is likely due to Atrial fibrillation. Read the instructions provided. As discussed, A-fib puts you at risk for strokes.  Ideally, you will be needing anticoagulation to reduce the risk of stroke.  You have opted to discuss anticoagulation with afib clinic - so please Follow-up with the A-fib clinic to discuss.

## 2022-07-03 NOTE — ED Triage Notes (Signed)
Pt BIB EMS from doctor's office for regular check up. Pt was hypotensive 94'F systolic and near syncope episode. Hx dementia at baseline. Denies pain, dizziness, N/V, diarrhea.   276m 20G R FA  BP 150/97 CBG 123 HR 95 pacemaker 97% RA

## 2022-07-10 ENCOUNTER — Ambulatory Visit (HOSPITAL_COMMUNITY)
Admission: RE | Admit: 2022-07-10 | Discharge: 2022-07-10 | Disposition: A | Discharge status: Home / Self Care | Payer: Medicare PPO | Source: Ambulatory Visit | Attending: Nurse Practitioner | Admitting: Nurse Practitioner

## 2022-07-10 ENCOUNTER — Telehealth: Payer: Self-pay

## 2022-07-10 ENCOUNTER — Encounter (HOSPITAL_COMMUNITY): Payer: Self-pay | Admitting: *Deleted

## 2022-07-10 VITALS — BP 112/96 | HR 65 | Ht 71.0 in | Wt 156.4 lb

## 2022-07-10 DIAGNOSIS — I4891 Unspecified atrial fibrillation: Secondary | ICD-10-CM

## 2022-07-10 NOTE — Telephone Encounter (Signed)
        Patient  visited [facility] on 21    Telephone encounter attempt :  1st  A HIPAA compliant voice message was left requesting a return call.  Instructed patient to call back     [facility] [phone] [address], [place], [place] [address] Phone: [phone] Email: [email or web address]

## 2022-07-10 NOTE — Progress Notes (Signed)
Primary Care Physician: [facility] Referring Physician:ED f/u    Dustin Hamilton is a 82 y.o. male with a h/o  dementia, HTN, TIA, PPM for CHB, that was sent to the ED for pt c/o of being weak with hypotension. ED MD referred here as he was going off afib as reported on pt EKG. Wife was hesitant to start anticoagulation in the ED and the MD was an f/u here.    The EKG's in the ED showed so much baseline  artifact, I cannot  say for certainty it was afib. A lot of artifact at baseline, but I think I can see some P waves with regular qrs spikes and an occasional PAC. Pt had not had a Paceart report sent since last June and appointment was not set up for his f/u with Dr. Lovena Le in April. The visit today is led by the wife and pt does not contribute to the visit. The wife does note that he has days with weakness and other days when he feels better. EKG today shows SR.   I contacted the device clinic and had a transmission forwarded to me off the [employer] site. Afib burden is reported out at 0%. So anticoagulation is not indicated.   Today, he denies symptoms of palpitations, chest pain, shortness of breath, orthopnea, PND, lower extremity edema, dizziness, presyncope, syncope, or neurologic sequela. The patient is tolerating medications without difficulties and is otherwise without complaint today.   Past Medical History:  Diagnosis Date   BPH (benign prostatic hyperplasia)    Cerebrovascular disease    Clostridium difficile diarrhea    Congenital anomaly of diaphragm    Elevated PSA    Glaucoma, both eyes    Hemorrhoid    Hepatitis B surface antigen positive    02-20-2011   History of adenomatous polyp of colon    2007, 2009 and 2013  tubular adenoma's   History of alcohol abuse    quit 1963   History of cerebral parenchymal hemorrhage    01/ 2006  left occiptial lobe related to hypertensive crisis   History of CVA (cerebrovascular accident)    09-12-2012  left  hippocampus/ amygdala junction and per MRI old white matter infarcts--  per pt residual short- term memory issues   History of fatty infiltration of liver hx visit's at [facility] , last visit 05/ 2014   elvated LFT's ,  via liver bx 2004 related to hx alcohol and drug abuse (quit 1964)   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   HTN (hypertension)    Renal cyst, left    Stroke ([facility])    hx of 3 strokes in past    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    Urethral lesion    urethral mass   Past Surgical History:  Procedure Laterality Date   CARDIOVASCULAR STRESS TEST  05/05/2007   normal nuclear study w/ no ischemia/  normal LV fucntion and wall motion , ef60%   COLONOSCOPY  last one 04-06-2012   CYSTO/  LEFT RETROGRADE PYELOGRAM/ CYTOLOGY WASHINGS/  URETEROSCOPY  03/05/2000   INGUINAL HERNIA REPAIR Bilateral 1965 and 1980's   IR RADIOLOGIST EVAL & MGMT  06/26/2022   LAPAROSCOPIC INGUINAL HERNIA WITH UMBILICAL HERNIA Right 59/93/5701   LIVER BIOPSY  1980's and 2004   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   Procedure: PACEMAKER IMPLANT;  Surgeon: Evans Lance, MD;  Location: [facility];  Service: Cardiovascular;  Laterality: N/A;   SVT ABLATION N/A 11/15/2018   Procedure: SVT ABLATION;  Surgeon: Evans Lance, MD;  Location: [facility];  Service: Cardiovascular;  Laterality: N/A;   TRANSTHORACIC ECHOCARDIOGRAM  09/13/2012   moderate LVH,  ef 60-65%/     TRANSURETHRAL RESECTION OF BLADDER TUMOR N/A 08/11/2016   Procedure: TRANSURETHRAL RESECTION OF BLADDER TUMOR (TURBT);  Surgeon: Cleon Gustin, MD;  Location: [facility];  Service: Urology;  Laterality: N/A;    Current Outpatient Medications  Medication Sig Dispense Refill   acetaminophen (TYLENOL) 325 MG tablet Take 2 tablets (650 mg total) by mouth every 6 (six) hours as needed for moderate pain. 30 tablet 0   allopurinol (ZYLOPRIM) 100  MG tablet Take 1 tablet (100 mg total) by mouth daily. 30 tablet 2   amLODipine (NORVASC) 2.5 MG tablet Take 2.5 mg by mouth daily.     brimonidine (ALPHAGAN) 0.2 % ophthalmic solution Place 1 drop into both eyes 2 (two) times daily.     diclofenac Sodium (VOLTAREN) 1 % GEL Apply 4 g topically 4 (four) times daily. (Patient taking differently: Apply 4 g topically daily as needed (to affected sites- for pain).) 100 g 0   dutasteride (AVODART) 0.5 MG capsule Take 0.5 mg by mouth daily.     latanoprost (XALATAN) 0.005 % ophthalmic solution Place 1 drop into both eyes at bedtime.      memantine (NAMENDA) 10 MG tablet Take 20 mg by mouth in the morning and at bedtime.     metoprolol succinate (TOPROL-XL) 25 MG 24 hr tablet Take 0.5 tablets (12.5 mg total) by mouth at bedtime. 30 tablet 1   Naphazoline HCl (CLEAR EYES OP) Place 2 drops into both eyes daily as needed (dry eyes).     Nutritional Supplements (ENSURE ACTIVE HEART HEALTH PO) Take 237 mLs by mouth daily as needed (appetite, nutrition). Clear/apple     polyethylene glycol (MIRALAX / GLYCOLAX) 17 g packet Take 17 g by mouth daily. (Patient taking differently: Take 17 g by mouth daily as needed for mild constipation.) 14 each 0   tamsulosin (FLOMAX) 0.4 MG CAPS capsule Take 0.4 mg by mouth at bedtime.     tiZANidine (ZANAFLEX) 4 MG tablet Take 4 mg by mouth 2 (two) times daily as needed for muscle spasms.     lidocaine (LIDODERM) 5 % Place 1 patch onto the skin daily as needed (back pain). Remove & Discard patch within 12 hours or as directed by MD (Patient not taking: Reported on 07/10/2022)     No current facility-administered medications for this encounter.    Allergies  Allergen Reactions   Aricept [Donepezil] Other (See Comments)    Dizziness    Levetiracetam Other (See Comments)    AMS   Penicillins Hives   Viagra [Sildenafil] Other (See Comments)    Dizziness     Social History   Socioeconomic History   Marital status:  Married    Spouse name: [name]   Number of children: 1   Years of education: College   Highest education level: Not on file  Occupational History   Occupation: Geophysicist/field seismologist  Tobacco Use   Smoking status: Former    Packs/day: 1.00    Years: 5.00    Total pack years: 5.00    Types: Cigarettes    Quit date: 11/30/1981    Years since quitting: 40.6   Smokeless tobacco: Never  Vaping Use  Vaping Use: Never used  Substance and Sexual Activity   Alcohol use: No    Alcohol/week: 0.0 standard drinks of alcohol    Comment: hx abuse -- quit:  1963   Drug use: No    Comment: 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   Caffeine Use:  Tea, lots   Social Determinants of Health   Financial Resource Strain: Not on file  Food Insecurity: Not on file  Transportation Needs: Not on file  Physical Activity: Not on file  Stress: Not on file  Social Connections: Not on file  Intimate Partner Violence: Not on file    Family History  Problem Relation Age of Onset   Rheum arthritis Mother    Diabetes Mother    Stroke Mother    Heart attack Mother    Kidney failure Mother    Heart attack Father    Heart disease Maternal Grandmother    Rheum arthritis Maternal Grandmother    Diabetes Maternal Grandmother    Stroke Maternal Grandmother    Colon cancer Neg Hx     ROS- All systems are reviewed and negative except as per the HPI above  Physical Exam: Vitals:   07/10/22 1103  BP: (!) 112/96  Pulse: 65  Weight: 70.9 kg  Height: '5\' 11"'$  (1.803 m)   Wt Readings from Last 3 Encounters:  07/10/22 70.9 kg  07/01/22 69.9 kg  05/01/22 69.9 kg    Labs: Lab Results  Component Value Date   NA 141 07/03/2022   K 3.8 07/03/2022   CL 107 07/03/2022   CO2 25 07/03/2022   GLUCOSE 107 (H) 07/03/2022   BUN 18 07/03/2022   CREATININE 1.15 07/03/2022    CALCIUM 9.6 07/03/2022   PHOS 2.7 09/06/2021   MG 1.8 09/06/2021   Lab Results  Component Value Date   INR 1.1 04/13/2022   Lab Results  Component Value Date   CHOL 130 09/05/2021   HDL 28 (L) 09/05/2021   LDLCALC 87 09/05/2021   TRIG 73 09/05/2021     GEN- The patient is well appearing, alert and oriented x 3 today.   Head- normocephalic, atraumatic Eyes-  Sclera clear, conjunctiva pink Ears- hearing intact Oropharynx- clear Neck- supple, no JVP Lymph- no cervical lymphadenopathy Lungs- Clear to ausculation bilaterally, normal work of breathing Heart- Regular rate and rhythm, no murmurs, rubs or gallops, PMI not laterally displaced GI- soft, NT, ND, + BS Extremities- no clubbing, cyanosis, or edema MS- no significant deformity or atrophy Skin- no rash or lesion Psych- euthymic mood, full affect Neuro- strength and sensation are intact  EKG-Vent. rate 65 BPM PR interval 388 ms QRS duration 132 ms QT/QTcB 398/413 ms P-R-T axes 86 -81 95 Sinus rhythm with 1st degree A-V block Left axis deviation Non-specific intra-ventricular conduction block Cannot rule out Anteroseptal infarct , age undetermined Abnormal ECG When compared with ECG of 03-Jul-2022 16:35, PREVIOUS ECG IS PRESENT  Paceart report reviewed that states afib burden 0%  Assessment and Plan:  1. Afib ED MD thought afib present since EKG interpretation read out afib, heavy baseline artifact  However, it does not look like afib to me Confirmed with paceart report that showed 0% afib burden  Anticoagulation not needed    I will request f/u with Dr. Lovena Le for office interrogation since  pt was not notified of an appointment in April 2023   Butch Penny C. Arisbel Maione, [facility] [address] [place], [place] [address] [phone]

## 2022-07-11 ENCOUNTER — Ambulatory Visit: Payer: Medicare PPO

## 2022-07-11 DIAGNOSIS — I442 Atrioventricular block, complete: Secondary | ICD-10-CM | POA: Diagnosis not present

## 2022-07-11 LAB — CUP PACEART REMOTE DEVICE CHECK
Battery Voltage: 85
Date Time Interrogation Session: 20240209074722
Implantable Lead Connection Status: 753985
Implantable Lead Connection Status: 753985
Implantable Lead Implant Date: 20211227
Implantable Lead Implant Date: 20211227
Implantable Lead Location: 753859
Implantable Lead Location: 753860
Implantable Lead Model: 377
Implantable Lead Model: 377
Implantable Lead Serial Number: 8000018960
Implantable Lead Serial Number: 8000042643
Implantable Pulse Generator Implant Date: 20211227
Pulse Gen Model: 407145
Pulse Gen Serial Number: 69915927

## 2022-07-15 NOTE — Progress Notes (Unsigned)
Cardiology Office Note Date:  07/16/2022  Patient ID:  Dustin Hamilton [birth year], MRN [id number] PCP:  [facility], [facility]  Cardiologist:  Quay Burow, MD Electrophysiologist: Cristopher Peru, MD   Chief Complaint: 1 year follow-up PPM, past due  History of Present Illness: Dustin Hamilton is a 82 y.o. male with PMH notable for SVT s/p AVNRT ablation, intermittent HB s/p PPM, HTN, syncope, stroke, dementia, ; seen today for Cristopher Peru, MD for routine electrophysiology followup.  Has not seen Dr. Lovena Le since 2022 for post-PPM insertion appt. Having some syncopal episodes, rec to check BP during episodes to see if hypotensive.  He presented to [facility] ER x 2 (06/2022 and 07/2022) with c/o weakness, hypotension. EKG concerning for AF though with considerable artifact. [place] was recommended, but patient's wife was not agreeable. He saw NP Kayleen Memos in AF clinic in follow-up and ER EKG was re-reviewed was determined to not be AF via PPM device showing 0% AF burden.   Since that time, the patient has been doing very well. Bps at home are [id number] systolics, no episodes of dizziness or lightheadedness. He has no acute complaints in office today. Accompanied by wife who provides most of the history.    he denies chest pain, palpitations, dyspnea, PND, orthopnea, nausea, vomiting, dizziness, syncope, edema, weight gain, or early satiety.    Device Information: Biotronik dual chamber PPM, imp 05/2020; dx intermittent CHB  Past Medical History:  Diagnosis Date   BPH (benign prostatic hyperplasia)    Cerebrovascular disease    Clostridium difficile diarrhea    Congenital anomaly of diaphragm    Elevated PSA    Glaucoma, both eyes    Hemorrhoid    Hepatitis B surface antigen positive    02-20-2011   History of adenomatous polyp of colon    2007, 2009 and 2013  tubular adenoma's   History of alcohol abuse    quit 1963   History of cerebral parenchymal hemorrhage    01/ 2006  left  occiptial lobe related to hypertensive crisis   History of CVA (cerebrovascular accident)    09-12-2012  left hippocampus/ amygdala junction and per MRI old white matter infarcts--  per pt residual short- term memory issues   History of fatty infiltration of liver hx visit's at [facility] , last visit 05/ 2014   elvated LFT's ,  via liver bx 2004 related to hx alcohol and drug abuse (quit 1964)   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   HTN (hypertension)    Renal cyst, left    Stroke ([place])    hx of 3 strokes in past    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    Urethral lesion    urethral mass    Past Surgical History:  Procedure Laterality Date   CARDIOVASCULAR STRESS TEST  05/05/2007   normal nuclear study w/ no ischemia/  normal LV fucntion and wall motion , ef60%   COLONOSCOPY  last one 04-06-2012   CYSTO/  LEFT RETROGRADE PYELOGRAM/ CYTOLOGY WASHINGS/  URETEROSCOPY  03/05/2000   INGUINAL HERNIA REPAIR Bilateral 1965 and 1980's   IR RADIOLOGIST EVAL & MGMT  06/26/2022   LAPAROSCOPIC INGUINAL HERNIA WITH UMBILICAL HERNIA Right [id number]   LIVER BIOPSY  1980's and 2004   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   Procedure: PACEMAKER IMPLANT;  Surgeon: Evans Lance, MD;  Location: [place] [facility] [facility];  Service: Cardiovascular;  Laterality: N/A;   SVT ABLATION N/A 11/15/2018   Procedure: SVT ABLATION;  Surgeon: Evans Lance, MD;  Location: [place] [facility];  Service: Cardiovascular;  Laterality: N/A;   TRANSTHORACIC ECHOCARDIOGRAM  09/13/2012   moderate LVH,  ef 60-65%/     TRANSURETHRAL RESECTION OF BLADDER TUMOR N/A 08/11/2016   Procedure: TRANSURETHRAL RESECTION OF BLADDER TUMOR (TURBT);  Surgeon: Cleon Gustin, MD;  Location[facility];  Service: Urology;  Laterality: N/A;    Current Outpatient Medications  Medication Instructions   acetaminophen (TYLENOL) 650 mg, Oral, Every 6 hours PRN    allopurinol (ZYLOPRIM) 100 mg, Oral, Daily   amLODipine (NORVASC) 2.5 mg, Oral, Daily   brimonidine (ALPHAGAN) 0.2 % ophthalmic solution 1 drop, Both Eyes, 2 times daily   diclofenac Sodium (VOLTAREN) 4 g, Topical, 4 times daily   dutasteride (AVODART) 0.5 mg, Oral, Daily   latanoprost (XALATAN) 0.005 % ophthalmic solution 1 drop, Both Eyes, Daily at bedtime   lidocaine (LIDODERM) 5 % 1 patch, Transdermal, Daily PRN, Remove & Discard patch within 12 hours or as directed by MD   memantine (NAMENDA) 20 mg, Oral, 2 times daily   metoprolol succinate (TOPROL-XL) 12.5 mg, Oral, Daily at bedtime   Naphazoline HCl (CLEAR EYES OP) 2 drops, Both Eyes, Daily PRN   Nutritional Supplements (ENSURE ACTIVE HEART HEALTH PO) 237 mLs, Oral, Daily PRN, Clear/apple   polyethylene glycol (MIRALAX / GLYCOLAX) 17 g, Oral, Daily   tamsulosin (FLOMAX) 0.4 mg, Oral, Daily at bedtime   tiZANidine (ZANAFLEX) 4 mg, Oral, 2 times daily PRN      Social History:  The patient  reports that he quit smoking about 40 years ago. His smoking use included cigarettes. He has a 5.00 pack-year smoking history. He has never used smokeless tobacco. He reports that he does not drink alcohol and does not use drugs.   Family History:  The patient's family history includes Diabetes in his maternal grandmother and mother; Heart attack in his father and mother; Heart disease in his maternal grandmother; Kidney failure in his mother; Rheum arthritis in his maternal grandmother and mother; Stroke in his maternal grandmother and mother.  ROS:  Please see the history of present illness. All other systems are reviewed and otherwise negative.   PHYSICAL EXAM:  VS:  BP 116/84   Pulse 75   Resp (!) 94   Ht 5' 11"$  (1.803 m)   Wt 155 lb 6.4 oz (70.5 kg)   BMI 21.67 kg/m  BMI: Body mass index is 21.67 kg/m.  GEN- The patient is thin, alert and oriented x 3 today.   HEENT: normocephalic, atraumatic; sclera clear, conjunctiva pink; hearing  intact; oropharynx clear; neck supple, no JVP Lungs- Clear to ausculation bilaterally, normal work of breathing.  No wheezes, rales, rhonchi Heart- Regular rate and rhythm, no murmurs, rubs or gallops, PMI not laterally displaced GI- soft, non-tender, non-distended, bowel sounds present, no hepatosplenomegaly Extremities- No peripheral edema. no clubbing or cyanosis; DP/PT/radial pulses 2+ bilaterally MS- no significant deformity or atrophy, uses rolling walker to ambulate Skin- warm and dry, no rash or lesion, device pocket well-healed Psych- euthymic mood, answers questions appropriately Neuro- strength and sensation are intact   Device interrogation done today and reviewed by myself:  Battery good Lead thresholds, impedence, sensing stable  No episodes 0% AF burden Lowered AF alert rate to 6h   EKG is not ordered.   Recent Labs: 09/06/2021: Magnesium 1.8 05/01/2022: ALT 18 07/03/2022: BUN 18;  Creatinine, Ser 1.15; Hemoglobin 15.8; Platelets 144; Potassium 3.8; Sodium 141  09/05/2021: Cholesterol 130; HDL 28; LDL Cholesterol 87; Total CHOL/HDL Ratio 4.6; Triglycerides 73; VLDL 15   Estimated Creatinine Clearance: 50.2 mL/min (by C-G formula based on SCr of 1.15 mg/dL).   Wt Readings from Last 3 Encounters:  07/16/22 155 lb 6.4 oz (70.5 kg)  07/10/22 156 lb 6.4 oz (70.9 kg)  07/01/22 154 lb (69.9 kg)     Additional studies reviewed include: Previous EP, cardiology notes.   TTE 09/04/21  1. Left ventricular ejection fraction, by estimation, is 55 to 60%. The left ventricle has normal function. The left ventricle has no regional wall motion abnormalities. There is mild concentric left ventricular hypertrophy. Left ventricular diastolic  parameters are consistent with Grade I diastolic dysfunction (impaired relaxation).   2. Right ventricular systolic function is normal. The right ventricular size is mildly enlarged. There is normal pulmonary artery systolic pressure. The estimated  right ventricular systolic pressure is [id number] mmHg.   3. A small pericardial effusion is present. The pericardial effusion is anterior to the right ventricle.   4. The mitral valve is normal in structure. No evidence of mitral valve regurgitation.   5. The aortic valve is tricuspid. Aortic valve regurgitation is not visualized. No aortic stenosis is present.   Comparison(s): No significant change from prior study. Prior images reviewed side by side. A similar small anterior pericardial effusion was present on that study.    ASSESSMENT AND PLAN:  #) Intermittent CHB s/p Bio PPM PPM functioning well, see paceart for details  #) SVT s/p AVNRT ablation No palpitations or symptomatic burden No episodes via device  #) HTN #) weakness, hypotension Long discussion about whether to continue 2.76m amlodipine in setting of recent symptomatic hypotension. Patient's wife very hesitant to stop medication I recommended he take it at night, and keep close eye on BP Given age, functional capacity, would favor permissive hypertension.    Current medicines are reviewed at length with the patient today.   The patient does not have concerns regarding his medicines.  The following changes were made today:  none  Labs/ tests ordered today include:  No orders of the defined types were placed in this encounter.    Disposition: Follow up with Dr. TLovena Leor EP APP in 12 months   Signed, SMamie Levers NP  07/16/22  9:34 AM  Electrophysiology [facility]

## 2022-07-16 ENCOUNTER — Ambulatory Visit: Payer: Medicare PPO | Attending: Physician Assistant | Admitting: Cardiology

## 2022-07-16 ENCOUNTER — Encounter: Payer: Self-pay | Admitting: Physician Assistant

## 2022-07-16 VITALS — BP 116/84 | HR 75 | Resp 94 | Ht 71.0 in | Wt 155.4 lb

## 2022-07-16 DIAGNOSIS — I471 Supraventricular tachycardia, unspecified: Secondary | ICD-10-CM

## 2022-07-16 DIAGNOSIS — I10 Essential (primary) hypertension: Secondary | ICD-10-CM

## 2022-07-16 DIAGNOSIS — Z950 Presence of cardiac pacemaker: Secondary | ICD-10-CM

## 2022-07-16 DIAGNOSIS — I442 Atrioventricular block, complete: Secondary | ICD-10-CM

## 2022-07-16 LAB — CUP PACEART INCLINIC DEVICE CHECK
Date Time Interrogation Session: 20240214100124
Implantable Lead Connection Status: 753985
Implantable Lead Connection Status: 753985
Implantable Lead Implant Date: 20211227
Implantable Lead Implant Date: 20211227
Implantable Lead Location: 753859
Implantable Lead Location: 753860
Implantable Lead Model: 377
Implantable Lead Model: 377
Implantable Lead Serial Number: 8000018960
Implantable Lead Serial Number: 8000042643
Implantable Pulse Generator Implant Date: 20211227
Pulse Gen Model: 407145
Pulse Gen Serial Number: 69915927

## 2022-07-16 NOTE — Patient Instructions (Signed)
Medication Instructions:   Your physician recommends that you continue on your current medications as directed. Please refer to the Current Medication list given to you today.  *If you need a refill on your cardiac medications before your next appointment, please call your pharmacy*   Lab Work: [place]   If you have labs (blood work) drawn today and your tests are completely normal, you will receive your results only by: [place] (if you have MyChart) OR A paper copy in the mail If you have any lab test that is abnormal or we need to change your treatment, we will call you to review the results.   Testing/Procedures:  NONE ORDERED  TODAY    Follow-Up: At [facility], you and your health needs are our priority.  As part of our continuing mission to provide you with exceptional heart care, we have created designated Provider Care Teams.  These Care Teams include your primary Cardiologist (physician) and Advanced Practice Providers (APPs -  Physician Assistants and Nurse Practitioners) who all work together to provide you with the care you need, when you need it.  We recommend signing up for the patient portal called "MyChart".  Sign up information is provided on this After Visit Summary.  MyChart is used to connect with patients for Virtual Visits (Telemedicine).  Patients are able to view lab/test results, encounter notes, upcoming appointments, etc.  Non-urgent messages can be sent to your provider as well.   To learn more about what you can do with MyChart, go to [email or web address].    Your next appointment:   1 year(s)  Provider:   You may see Cristopher Peru, MD or one of the following Advanced Practice Providers on your designated Care Team:   Tommye Standard, [place] "Jonni Sanger" [place], [place] Mamie Levers, NP    Other Instructions

## 2022-07-28 DIAGNOSIS — R310 Gross hematuria: Secondary | ICD-10-CM | POA: Diagnosis not present

## 2022-07-29 NOTE — Progress Notes (Signed)
Remote pacemaker transmission.   

## 2022-07-30 ENCOUNTER — Other Ambulatory Visit: Payer: Self-pay | Admitting: Radiology

## 2022-07-30 DIAGNOSIS — N400 Enlarged prostate without lower urinary tract symptoms: Secondary | ICD-10-CM

## 2022-07-31 ENCOUNTER — Other Ambulatory Visit (HOSPITAL_COMMUNITY): Payer: Self-pay | Admitting: Interventional Radiology

## 2022-07-31 ENCOUNTER — Other Ambulatory Visit: Payer: Self-pay | Admitting: Student

## 2022-07-31 ENCOUNTER — Other Ambulatory Visit: Payer: Self-pay

## 2022-07-31 ENCOUNTER — Ambulatory Visit (HOSPITAL_COMMUNITY)
Admission: RE | Admit: 2022-07-31 | Discharge: 2022-07-31 | Disposition: A | Discharge status: Home / Self Care | Payer: Medicare PPO | Source: Ambulatory Visit | Attending: Interventional Radiology | Admitting: Interventional Radiology

## 2022-07-31 DIAGNOSIS — N400 Enlarged prostate without lower urinary tract symptoms: Secondary | ICD-10-CM

## 2022-07-31 DIAGNOSIS — Z87891 Personal history of nicotine dependence: Secondary | ICD-10-CM | POA: Diagnosis not present

## 2022-07-31 DIAGNOSIS — I10 Essential (primary) hypertension: Secondary | ICD-10-CM | POA: Insufficient documentation

## 2022-07-31 DIAGNOSIS — Z8673 Personal history of transient ischemic attack (TIA), and cerebral infarction without residual deficits: Secondary | ICD-10-CM | POA: Diagnosis not present

## 2022-07-31 HISTORY — PX: IR ANGIOGRAM SELECTIVE EACH ADDITIONAL VESSEL: IMG667

## 2022-07-31 HISTORY — PX: IR ANGIOGRAM PELVIS SELECTIVE OR SUPRASELECTIVE: IMG661

## 2022-07-31 HISTORY — PX: IR 3D INDEPENDENT WKST: IMG2385

## 2022-07-31 HISTORY — PX: IR EMBO TUMOR ORGAN ISCHEMIA INFARCT INC GUIDE ROADMAPPING: IMG5449

## 2022-07-31 HISTORY — PX: IR US GUIDE VASC ACCESS RIGHT: IMG2390

## 2022-07-31 LAB — CBC WITH DIFFERENTIAL/PLATELET
Abs Immature Granulocytes: 0.01 10*3/uL (ref 0.00–0.07)
Basophils Absolute: 0 10*3/uL (ref 0.0–0.1)
Basophils Relative: 1 %
Eosinophils Absolute: 0.1 10*3/uL (ref 0.0–0.5)
Eosinophils Relative: 2 %
HCT: 49 % (ref 39.0–52.0)
Hemoglobin: 16.2 g/dL (ref 13.0–17.0)
Immature Granulocytes: 0 %
Lymphocytes Relative: 20 %
Lymphs Abs: 1.1 10*3/uL (ref 0.7–4.0)
MCH: 31.5 pg (ref 26.0–34.0)
MCHC: 33.1 g/dL (ref 30.0–36.0)
MCV: 95.3 fL (ref 80.0–100.0)
Monocytes Absolute: 0.4 10*3/uL (ref 0.1–1.0)
Monocytes Relative: 6 %
Neutro Abs: 3.9 10*3/uL (ref 1.7–7.7)
Neutrophils Relative %: 71 %
Platelets: 135 10*3/uL — ABNORMAL LOW (ref 150–400)
RBC: 5.14 MIL/uL (ref 4.22–5.81)
RDW: 13.9 % (ref 11.5–15.5)
WBC: 5.6 10*3/uL (ref 4.0–10.5)
nRBC: 0 % (ref 0.0–0.2)

## 2022-07-31 LAB — COMPREHENSIVE METABOLIC PANEL
ALT: 14 U/L (ref 0–44)
AST: 20 U/L (ref 15–41)
Albumin: 4.1 g/dL (ref 3.5–5.0)
Alkaline Phosphatase: 73 U/L (ref 38–126)
Anion gap: 3 — ABNORMAL LOW (ref 5–15)
BUN: 14 mg/dL (ref 8–23)
CO2: 28 mmol/L (ref 22–32)
Calcium: 9.5 mg/dL (ref 8.9–10.3)
Chloride: 107 mmol/L (ref 98–111)
Creatinine, Ser: 1.04 mg/dL (ref 0.61–1.24)
GFR, Estimated: >60 mL/min (ref >60)
Glucose, Bld: 106 mg/dL — ABNORMAL HIGH (ref 70–99)
Potassium: 4.1 mmol/L (ref 3.5–5.1)
Sodium: 138 mmol/L (ref 135–145)
Total Bilirubin: 0.6 mg/dL (ref 0.3–1.2)
Total Protein: 7.8 g/dL (ref 6.5–8.1)

## 2022-07-31 LAB — PROTIME-INR
INR: 1 (ref 0.8–1.2)
Prothrombin Time: 13.3 seconds (ref 11.4–15.2)

## 2022-07-31 MED ORDER — VERAPAMIL HCL 2.5 MG/ML IV SOLN
INTRAVENOUS | Status: AC
  Filled 2022-07-31: qty 2

## 2022-07-31 MED ORDER — SULFAMETHOXAZOLE-TRIMETHOPRIM 400-80 MG PO TABS
1.0000 | ORAL_TABLET | Freq: Once | ORAL | Status: AC
  Administered 2022-07-31: 1 via ORAL
  Filled 2022-07-31: qty 1

## 2022-07-31 MED ORDER — OXYCODONE HCL 5 MG PO TABS: 5.0000 mg | ORAL_TABLET | Freq: Four times a day (QID) | ORAL | 0 refills | Status: AC | PRN

## 2022-07-31 MED ORDER — IBUPROFEN 800 MG PO TABS: 800.0000 mg | ORAL_TABLET | Freq: Three times a day (TID) | ORAL | 0 refills | Status: DC | PRN

## 2022-07-31 MED ORDER — FENTANYL CITRATE (PF) 100 MCG/2ML IJ SOLN
INTRAMUSCULAR | Status: AC
  Filled 2022-07-31: qty 2

## 2022-07-31 MED ORDER — KETOROLAC TROMETHAMINE 30 MG/ML IJ SOLN
30.0000 mg | INTRAMUSCULAR | Status: AC
  Administered 2022-07-31: 30 mg via INTRAVENOUS
  Filled 2022-07-31: qty 1

## 2022-07-31 MED ORDER — PREDNISONE 50 MG PO TABS: 50.0000 mg | ORAL_TABLET | Freq: Every day | ORAL | 0 refills | Status: AC

## 2022-07-31 MED ORDER — IOHEXOL 300 MG/ML  SOLN
100.0000 mL | Freq: Once | INTRAMUSCULAR | Status: AC | PRN
  Administered 2022-07-31: 50 mL via INTRA_ARTERIAL

## 2022-07-31 MED ORDER — FENTANYL CITRATE (PF) 100 MCG/2ML IJ SOLN
INTRAMUSCULAR | Status: AC | PRN
  Administered 2022-07-31: 50 ug via INTRAVENOUS
  Administered 2022-07-31 (×2): 25 ug via INTRAVENOUS

## 2022-07-31 MED ORDER — MIDAZOLAM HCL 2 MG/2ML IJ SOLN
INTRAMUSCULAR | Status: AC | PRN
  Administered 2022-07-31 (×2): .5 mg via INTRAVENOUS
  Administered 2022-07-31: 1 mg via INTRAVENOUS

## 2022-07-31 MED ORDER — SODIUM CHLORIDE 0.9 % IV SOLN: INTRAVENOUS | Status: DC

## 2022-07-31 MED ORDER — SULFAMETHOXAZOLE-TRIMETHOPRIM 400-80 MG PO TABS: 1.0000 | ORAL_TABLET | Freq: Two times a day (BID) | ORAL | 0 refills | Status: AC

## 2022-07-31 MED ORDER — LIDOCAINE HCL 1 % IJ SOLN
INTRAMUSCULAR | Status: AC
  Filled 2022-07-31: qty 20

## 2022-07-31 MED ORDER — DOCUSATE SODIUM 100 MG PO CAPS: 100.0000 mg | ORAL_CAPSULE | Freq: Two times a day (BID) | ORAL | 0 refills | Status: DC

## 2022-07-31 MED ORDER — DEXAMETHASONE SODIUM PHOSPHATE 10 MG/ML IJ SOLN
8.0000 mg | Freq: Once | INTRAMUSCULAR | Status: AC
  Administered 2022-07-31: 8 mg via INTRAVENOUS
  Filled 2022-07-31: qty 1

## 2022-07-31 MED ORDER — MIDAZOLAM HCL 2 MG/2ML IJ SOLN
INTRAMUSCULAR | Status: AC
  Filled 2022-07-31: qty 2

## 2022-07-31 MED ORDER — IOHEXOL 300 MG/ML  SOLN
100.0000 mL | Freq: Once | INTRAMUSCULAR | Status: AC | PRN
  Administered 2022-07-31: 1 mL via INTRA_ARTERIAL

## 2022-07-31 MED ORDER — VERAPAMIL HCL 2.5 MG/ML IV SOLN
INTRAVENOUS | Status: AC | PRN
  Administered 2022-07-31 (×2): 2.5 mg via INTRA_ARTERIAL

## 2022-07-31 NOTE — H&P (Signed)
Chief Complaint: Patient was seen in consultation today for benign prostatic hyperplasia  Referring Physician(s): Corrie Mckusick  Supervising Physician: Corrie Mckusick  Patient Status: [facility] - Out-pt  History of Present Illness: Dustin Hamilton is a 82 y.o. male with PMH significant for hypertension and history of CVA being seen today in relation to benign prostatic hyperplasia. The patient has been treated for multiple UTIs over the past year, and imaging has revealed an enlarged prostate. He currently has a urinary foley catheter placed for his procedure today. He presents in his usual state of health and is accompanied by his wife on today's visit.  Past Medical History:  Diagnosis Date   BPH (benign prostatic hyperplasia)    Cerebrovascular disease    Clostridium difficile diarrhea    Congenital anomaly of diaphragm    Elevated PSA    Glaucoma, both eyes    Hemorrhoid    Hepatitis B surface antigen positive    02-20-2011   History of adenomatous polyp of colon    2007, 2009 and 2013  tubular adenoma's   History of alcohol abuse    quit 1963   History of cerebral parenchymal hemorrhage    01/ 2006  left occiptial lobe related to hypertensive crisis   History of CVA (cerebrovascular accident)    09-12-2012  left hippocampus/ amygdala junction and per MRI old white matter infarcts--  per pt residual short- term memory issues   History of fatty infiltration of liver hx visit's at [facility] , last visit 05/ 2014   elvated LFT's ,  via liver bx 2004 related to hx alcohol and drug abuse (quit 1964)   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   HTN (hypertension)    Renal cyst, left    Stroke ([place])    hx of 3 strokes in past    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    Urethral lesion    urethral mass    Past Surgical History:  Procedure Laterality Date   CARDIOVASCULAR STRESS TEST  05/05/2007   normal  nuclear study w/ no ischemia/  normal LV fucntion and wall motion , ef60%   COLONOSCOPY  last one 04-06-2012   CYSTO/  LEFT RETROGRADE PYELOGRAM/ CYTOLOGY WASHINGS/  URETEROSCOPY  03/05/2000   INGUINAL HERNIA REPAIR Bilateral 1965 and 1980's   IR RADIOLOGIST EVAL & MGMT  06/26/2022   LAPAROSCOPIC INGUINAL HERNIA WITH UMBILICAL HERNIA Right [id number]   LIVER BIOPSY  1980's and 2004   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   Procedure: PACEMAKER IMPLANT;  Surgeon: Evans Lance, MD;  Location: [facility];  Service: Cardiovascular;  Laterality: N/A;   SVT ABLATION N/A 11/15/2018   Procedure: SVT ABLATION;  Surgeon: Evans Lance, MD;  Location: [facility];  Service: Cardiovascular;  Laterality: N/A;   TRANSTHORACIC ECHOCARDIOGRAM  09/13/2012   moderate LVH,  ef 60-65%/     TRANSURETHRAL RESECTION OF BLADDER TUMOR N/A 08/11/2016   Procedure: TRANSURETHRAL RESECTION OF BLADDER TUMOR (TURBT);  Surgeon: Cleon Gustin, MD;  Location: [facility];  Service: Urology;  Laterality: N/A;    Allergies: Aricept [donepezil], Levetiracetam, Penicillins, and Viagra [sildenafil]  Medications: Prior to Admission medications   Medication Sig Start Date End Date Taking? Authorizing Provider  allopurinol (ZYLOPRIM) 100 MG tablet Take 1 tablet (100 mg total) by mouth daily. 04/05/21 07/31/22 Yes Pokhrel, Laxman, MD  amLODipine (NORVASC) 2.5 MG  tablet Take 2.5 mg by mouth daily.   Yes [provider]  brimonidine (ALPHAGAN) 0.2 % ophthalmic solution Place 1 drop into both eyes 2 (two) times daily.   Yes [provider]  diclofenac Sodium (VOLTAREN) 1 % GEL Apply 4 g topically 4 (four) times daily. Patient taking differently: Apply 4 g topically daily as needed (to affected sites- for pain). 06/05/21  Yes Deno Etienne, DO  dutasteride (AVODART) 0.5 MG capsule Take 0.5 mg by mouth daily.   Yes [provider]  latanoprost (XALATAN) 0.005 % ophthalmic solution  Place 1 drop into both eyes at bedtime.    Yes [provider]  lidocaine (LIDODERM) 5 % Place 1 patch onto the skin daily as needed (back pain). Remove & Discard patch within 12 hours or as directed by MD   Yes [provider]  memantine (NAMENDA) 10 MG tablet Take 20 mg by mouth in the morning and at bedtime.   Yes [provider]  metoprolol succinate (TOPROL-XL) 25 MG 24 hr tablet Take 0.5 tablets (12.5 mg total) by mouth at bedtime. 05/31/20  Yes Lilland, Alana, DO  Naphazoline HCl (CLEAR EYES OP) Place 2 drops into both eyes daily as needed (dry eyes).   Yes [provider]  Nutritional Supplements ([place] PO) Take 237 mLs by mouth daily as needed (appetite, nutrition). Clear/apple   Yes [provider]  polyethylene glycol (MIRALAX / GLYCOLAX) 17 g packet Take 17 g by mouth daily. Patient taking differently: Take 17 g by mouth daily as needed for mild constipation. 05/01/22  Yes Drenda Freeze, MD  tamsulosin (FLOMAX) 0.4 MG CAPS capsule Take 0.4 mg by mouth at bedtime.   Yes [provider]  tiZANidine (ZANAFLEX) 4 MG tablet Take 4 mg by mouth 2 (two) times daily as needed for muscle spasms. 06/11/21  Yes [provider]  acetaminophen (TYLENOL) 325 MG tablet Take 2 tablets (650 mg total) by mouth every 6 (six) hours as needed for moderate pain. 02/03/22   Jaynee Eagles, PA-C     Family History  Problem Relation Age of Onset   Rheum arthritis Mother    Diabetes Mother    Stroke Mother    Heart attack Mother    Kidney failure Mother    Heart attack Father    Heart disease Maternal Grandmother    Rheum arthritis Maternal Grandmother    Diabetes Maternal Grandmother    Stroke Maternal Grandmother    Colon cancer Neg Hx     Social History   Socioeconomic History   Marital status: Married    Spouse name: [name]   Number of children: 1   Years of education: College   Highest education level: Not on  file  Occupational History   Occupation: Geophysicist/field seismologist  Tobacco Use   Smoking status: Former    Packs/day: 1.00    Years: 5.00    Total pack years: 5.00    Types: Cigarettes    Quit date: 11/30/1981    Years since quitting: 40.6   Smokeless tobacco: Never  Vaping Use   Vaping Use: Never used  Substance and Sexual Activity   Alcohol use: No    Alcohol/week: 0.0 standard drinks of alcohol    Comment: hx abuse -- quit:  1963   Drug use: No    Comment: hx abuse -- quit 1964 (iv heroin and cocaine   Sexual activity: Not on file  Other Topics Concern   Not on  file  Social History Narrative   Patient lives at home with his spouse.   Caffeine Use:  Tea, lots   Social Determinants of Health   Financial Resource Strain: Not on file  Food Insecurity: Not on file  Transportation Needs: Not on file  Physical Activity: Not on file  Stress: Not on file  Social Connections: Not on file   Code Status: Full code  Review of Systems: A 12 point ROS discussed and pertinent positives are indicated in the HPI above.  All other systems are negative.  Review of Systems  Constitutional:  Negative for chills and fever.  Respiratory:  Negative for chest tightness and shortness of breath.   Cardiovascular:  Negative for chest pain and leg swelling.  Gastrointestinal:  Negative for abdominal pain, diarrhea, nausea and vomiting.  Neurological:  Negative for dizziness and headaches.  Psychiatric/Behavioral:  Negative for confusion.     Vital Signs: BP (!) 150/94 (BP Location: Right Arm)   Pulse 62   Temp 98.4 F (36.9 C) (Oral)   Resp 15   Ht '5\' 11"'$  (1.803 m)   Wt 154 lb (69.9 kg)   SpO2 97%   BMI 21.48 kg/m    Physical Exam Vitals reviewed.  HENT:     Mouth/Throat:     Mouth: Mucous membranes are moist.  Cardiovascular:     Rate and Rhythm: Normal rate and regular rhythm.     Pulses: Normal pulses.     Heart sounds: Normal heart sounds.  Pulmonary:     Effort: Pulmonary effort  is normal.     Breath sounds: Normal breath sounds.  Abdominal:     General: Bowel sounds are normal.     Palpations: Abdomen is soft.     Tenderness: There is no abdominal tenderness.  Musculoskeletal:     Right lower leg: No edema.     Left lower leg: No edema.  Neurological:     Mental Status: He is alert and oriented to person, place, and time.  Psychiatric:        Mood and Affect: Mood normal.        Behavior: Behavior normal.        Thought Content: Thought content normal.        Judgment: Judgment normal.     Imaging: CUP PACEART INCLINIC DEVICE CHECK  Result Date: 07/16/2022 Pacemaker check in clinic. Normal device function. Thresholds, sensing, impedances consistent with previous measurements. Device programmed to maximize longevity. No mode switch or high ventricular rates noted. Device programmed at appropriate safety margins. Histogram distribution appropriate for patient activity level. Device programmed to optimize intrinsic conduction. Estimated longevity 8.75 years. Patient enrolled in remote follow-up. Patient education completed.  CUP PACEART REMOTE DEVICE CHECK  Result Date: 07/11/2022 Scheduled remote reviewed. Normal device function.  Next remote 91 days. LA  DG Chest Port 1 View  Result Date: 07/02/2022 CLINICAL DATA:  Fever and altered mental status EXAM: PORTABLE CHEST 1 VIEW COMPARISON:  04/13/2022 FINDINGS: Right lower lobe atelectasis or consolidation. No pleural effusion or pneumothorax. Elevated right hemidiaphragm. Stable cardiomediastinal silhouette. Aortic atherosclerotic calcification. Left chest wall pacemaker. IMPRESSION: Elevated right hemidiaphragm with right lower lobe atelectasis or consolidation. Electronically Signed   By: Placido Sou M.D.   On: 07/02/2022 03:33   CT Head Wo Contrast  Result Date: 07/02/2022 CLINICAL DATA:  Delirium EXAM: CT HEAD WITHOUT CONTRAST TECHNIQUE: Contiguous axial images were obtained from the base of the skull  through the vertex without intravenous contrast.  RADIATION DOSE REDUCTION: This exam was performed according to the departmental dose-optimization program which includes automated exposure control, adjustment of the mA and/or kV according to patient size and/or use of iterative reconstruction technique. COMPARISON:  05/01/2022 FINDINGS: Brain: There is no mass, hemorrhage or extra-axial collection. There is generalized atrophy without lobar predilection. Hypodensity of the white matter is most commonly associated with chronic microvascular disease. Vascular: Atherosclerotic calcification of the vertebral and internal carotid arteries at the skull base. No abnormal hyperdensity of the major intracranial arteries or dural venous sinuses. Skull: The visualized skull base, calvarium and extracranial soft tissues are normal. Sinuses/Orbits: No fluid levels or advanced mucosal thickening of the visualized paranasal sinuses. No mastoid or middle ear effusion. The orbits are normal. IMPRESSION: 1. No acute intracranial abnormality. 2. Generalized atrophy and findings of chronic microvascular disease. Electronically Signed   By: Ulyses Jarred M.D.   On: 07/02/2022 02:15    Labs:  CBC: Recent Labs    05/01/22 1838 07/01/22 2257 07/03/22 1701 07/31/22 0800  WBC 8.0 4.5 6.4 5.6  HGB 16.1 16.4 15.8 16.2  HCT 49.0 49.2 48.4 49.0  PLT 168 122* 144* 135*    COAGS: Recent Labs    09/04/21 1324 04/13/22 1010 07/31/22 0800  INR 1.2 1.1 1.0    BMP: Recent Labs    05/01/22 1838 07/01/22 2257 07/03/22 1701 07/31/22 0800  NA 143 140 141 138  K 3.7 3.7 3.8 4.1  CL 109 107 107 107  CO2 '26 26 25 28  '$ GLUCOSE 121* 104* 107* 106*  BUN '21 15 18 14  '$ CALCIUM 9.8 9.8 9.6 9.5  CREATININE 1.02 0.87 1.15 1.04  GFRNONAA >60 >60 >60 >60    LIVER FUNCTION TESTS: Recent Labs    09/27/21 2120 04/13/22 1010 05/01/22 1838 07/31/22 0800  BILITOT 0.6 0.4 0.5 0.6  AST '23 16 18 20  '$ ALT '14 14 18 14  '$ ALKPHOS 61  58 61 73  PROT 7.3 7.3 7.6 7.8  ALBUMIN 3.8 4.0 3.9 4.1    TUMOR MARKERS: No results for input(s): "AFPTM", "CEA", "CA199", "CHROMGRNA" in the last 8760 hours.  Assessment and Plan:  Dustin Hamilton is an 82 yo male being seen today in relation to BPH. Patient is set to undergo prostate artery embolization for treatment of his BPH. The case was approved by Dr Earleen Newport and is set to proceed on 07/31/22. Orders have been placed for post-discharge medication including Bactrim BID x5 days, prednisone '50mg'$  x5 day, ibuprofen '800mg'$  q8 hrs PRN for pain, oxycodone '5mg'$  IR tablets q6 hrs PRN for pain, and Colace '100mg'$  BIX x7 days.    Risks and benefits of procedure were discussed with the patient including, but not limited to bleeding, infection, vascular injury or contrast induced renal failure.   This interventional procedure involves the use of X-rays and because of the nature of the planned procedure, it is possible that we will have prolonged use of X-ray fluoroscopy.   Potential radiation risks to you include (but are not limited to) the following: - A slightly elevated risk for cancer several years later in life. This risk is typically less than 0.5% percent. This risk is low in comparison to the normal incidence of human cancer, which is 33% for women and 50% for men according to the [employer].  - Radiation induced injury can include skin redness, resembling a rash, tissue breakdown / ulcers and hair loss (which can be temporary or permanent).    The likelihood  of either of these occurring depends on the difficulty of the procedure and whether you are sensitive to radiation due to previous procedures, disease, or genetic conditions.    IF your procedure requires a prolonged use of radiation, you will be notified and given written instructions for further action.  It is your responsibility to monitor the irradiated area for the 2 weeks following the procedure and to notify your  physician if you are concerned that you have suffered a radiation induced injury.     All of the patient's questions were answered, patient is agreeable to proceed.   Consent signed and in chart.   Thank you for this interesting consult.  I greatly enjoyed meeting Dustin Hamilton and look forward to participating in their care.  A copy of this report was sent to the requesting provider on this date.  Electronically Signed: Lura Em, PA-C 07/31/2022, 8:54 AM   I spent a total of   25 Minutes in face to face in clinical consultation, greater than 50% of which was counseling/coordinating care for benign prostatic hyperplasia.

## 2022-07-31 NOTE — Discharge Instructions (Signed)
Please call Interventional Radiology clinic [phone] with any questions or concerns.  You may remove your dressing and shower tomorrow.  Moderate Conscious Sedation, Adult, Care After This sheet gives you information about how to care for yourself after your procedure. Your health care provider may also give you more specific instructions. If you have problems or questions, contact your health careprovider. What can I expect after the procedure? After the procedure, it is common to have: Sleepiness for several hours. Impaired judgment for several hours. Difficulty with balance. Vomiting if you eat too soon. Follow these instructions at home: For the time period you were told by your health care provider: Rest. Do not participate in activities where you could fall or become injured. Do not drive or use machinery. Do not drink alcohol. Do not take sleeping pills or medicines that cause drowsiness. Do not make important decisions or sign legal documents. Do not take care of children on your own. Eating and drinking  Follow the diet recommended by your health care provider. Drink enough fluid to keep your urine pale yellow. If you vomit: Drink water, juice, or soup when you can drink without vomiting. Make sure you have little or no nausea before eating solid foods.  General instructions Take over-the-counter and prescription medicines only as told by your health care provider. Have a responsible adult stay with you for the time you are told. It is important to have someone help care for you until you are awake and alert. Do not smoke. Keep all follow-up visits as told by your health care provider. This is important. Contact a health care provider if: You are still sleepy or having trouble with balance after 24 hours. You feel light-headed. You keep feeling nauseous or you keep vomiting. You develop a rash. You have a fever. You have redness or swelling around the IV  site. Get help right away if: You have trouble breathing. You have new-onset confusion at home. Summary After the procedure, it is common to feel sleepy, have impaired judgment, or feel nauseous if you eat too soon. Rest after you get home. Know the things you should not do after the procedure. Follow the diet recommended by your health care provider and drink enough fluid to keep your urine pale yellow. Get help right away if you have trouble breathing or new-onset confusion at home. This information is not intended to replace advice given to you by your health care provider. Make sure you discuss any questions you have with your healthcare provider. Document Revised: 09/16/2019 Document Reviewed: 04/14/2019 Elsevier Patient Education  2022 [employer]Femoral Site Care This sheet gives you information about how to care for yourself after your procedure. Your health care provider may also give you more specific instructions. If you have problems or questions, contact your health care provider. What can I expect after the procedure? After the procedure, it is common to have: Bruising that usually fades within 1-2 weeks. Tenderness at the site. Follow these instructions at home: Wound care Follow instructions from your health care provider about how to take care of your insertion site. Make sure you: Wash your hands with soap and water before you change your bandage (dressing). If soap and water are not available, use hand sanitizer. Change your dressing as told by your health care provider. Leave stitches (sutures), skin glue, or adhesive strips in place. These skin closures may need to stay in place for 2 weeks or longer. If adhesive strip edges start to loosen and  curl up, you may trim the loose edges. Do not remove adhesive strips completely unless your health care provider tells you to do that. Do not take baths, swim, or use a hot tub until your health care provider approves. You may  shower 24-48 hours after the procedure or as told by your health care provider. Gently wash the site with plain soap and water. Pat the area dry with a clean towel. Do not rub the site. This may cause bleeding. Do not apply powder or lotion to the site. Keep the site clean and dry. Check your femoral site every day for signs of infection. Check for: Redness, swelling, or pain. Fluid or blood. Warmth. Pus or a bad smell. Activity For the first 2-3 days after your procedure, or as long as directed: Avoid climbing stairs as much as possible. Do not squat. Do not lift anything that is heavier than 10 lb (4.5 kg), or the limit that you are told, until your health care provider says that it is safe. Rest as directed. Avoid sitting for a long time without moving. Get up to take short walks every 1-2 hours. Do not drive for 24 hours if you were given a medicine to help you relax (sedative). General instructions Take over-the-counter and prescription medicines only as told by your health care provider. Keep all follow-up visits as told by your health care provider. This is important. Contact a health care provider if you have: A fever or chills. You have redness, swelling, or pain around your insertion site. Get help right away if: The catheter insertion area swells very fast. You pass out. You suddenly start to sweat or your skin gets clammy. The catheter insertion area is bleeding, and the bleeding does not stop when you hold steady pressure on the area. The area near or just beyond the catheter insertion site becomes pale, cool, tingly, or numb. These symptoms may represent a serious problem that is an emergency. Do not wait to see if the symptoms will go away. Get medical help right away. Call your local emergency services (911 in the [place] Do not drive yourself to the hospital. Summary After the procedure, it is common to have bruising that usually fades within 1-2 weeks. Check your  femoral site every day for signs of infection. Do not lift anything that is heavier than 10 lb (4.5 kg), or the limit that you are told, until your health care provider says that it is safe. This information is not intended to replace advice given to you by your health care provider. Make sure you discuss any questions you have with your health care provider. Document Revised: 06/01/2017 Document Reviewed: 06/01/2017 Elsevier Patient Education  2020 [employer].

## 2022-07-31 NOTE — Procedures (Signed)
Interventional Radiology Procedure Note  Procedure:   US guided access right CFA.  Pelvic angiogram, selective angio of bilateral prostate arteries.  Flat-panel CT Embosphere 300-500 embolization of bilateral prostate arteries CELT for hemostasis  Complications: None  Recommendations:  - right hip straight x 1 hr.  CELT has indication for immediate ambulation.  Best rest for sedation recovery only - routine wound care - advance diet - may restart all home meds - follow up for voiding trial with urology 5- 7 days - follow up with Dr. Earleen Newport in clinic in ~4 weeks - Clinic number [id number] with any concerns - Do not submerge for 7 days - anticipate DC home in 1 hr  Signed,  Dulcy Fanny. Earleen Newport, DO

## 2022-08-01 ENCOUNTER — Other Ambulatory Visit: Payer: Self-pay | Admitting: Interventional Radiology

## 2022-08-01 DIAGNOSIS — N400 Enlarged prostate without lower urinary tract symptoms: Secondary | ICD-10-CM

## 2022-08-02 ENCOUNTER — Emergency Department (HOSPITAL_COMMUNITY): Payer: No Typology Code available for payment source

## 2022-08-02 ENCOUNTER — Encounter (HOSPITAL_COMMUNITY): Payer: Self-pay | Admitting: Emergency Medicine

## 2022-08-02 ENCOUNTER — Emergency Department (HOSPITAL_COMMUNITY)
Admission: EM | Admit: 2022-08-02 | Discharge: 2022-08-02 | Disposition: A | Discharge status: Home / Self Care | Payer: No Typology Code available for payment source | Attending: Emergency Medicine | Admitting: Emergency Medicine

## 2022-08-02 ENCOUNTER — Other Ambulatory Visit: Payer: Self-pay

## 2022-08-02 DIAGNOSIS — I10 Essential (primary) hypertension: Secondary | ICD-10-CM | POA: Insufficient documentation

## 2022-08-02 DIAGNOSIS — D72829 Elevated white blood cell count, unspecified: Secondary | ICD-10-CM | POA: Insufficient documentation

## 2022-08-02 DIAGNOSIS — Z79899 Other long term (current) drug therapy: Secondary | ICD-10-CM | POA: Insufficient documentation

## 2022-08-02 DIAGNOSIS — R5383 Other fatigue: Secondary | ICD-10-CM | POA: Insufficient documentation

## 2022-08-02 DIAGNOSIS — R059 Cough, unspecified: Secondary | ICD-10-CM | POA: Insufficient documentation

## 2022-08-02 DIAGNOSIS — Z1152 Encounter for screening for COVID-19: Secondary | ICD-10-CM | POA: Diagnosis not present

## 2022-08-02 LAB — URINALYSIS, ROUTINE W REFLEX MICROSCOPIC
Bacteria, UA: NONE SEEN
Bilirubin Urine: NEGATIVE
Glucose, UA: 150 mg/dL — AB
Hgb urine dipstick: NEGATIVE
Ketones, ur: NEGATIVE mg/dL
Nitrite: NEGATIVE
Protein, ur: 30 mg/dL — AB
Specific Gravity, Urine: 1.016 (ref 1.005–1.030)
pH: 5 (ref 5.0–8.0)

## 2022-08-02 LAB — RESP PANEL BY RT-PCR (RSV, FLU A&B, COVID)  RVPGX2
Influenza A by PCR: NEGATIVE
Influenza B by PCR: NEGATIVE
Resp Syncytial Virus by PCR: NEGATIVE
SARS Coronavirus 2 by RT PCR: NEGATIVE

## 2022-08-02 NOTE — Discharge Instructions (Addendum)
Your urinalysis did not show any sign of infection. You are also taking medication to help prevent infection.   Follow up with your primary care physician as needed.

## 2022-08-02 NOTE — ED Notes (Signed)
Pt ambulated in room w/ steady gait and assistance.

## 2022-08-02 NOTE — ED Provider Notes (Signed)
[facility] Provider Note   CSN: [id number] Arrival date & time: 08/02/22  1646     History  Chief Complaint  Patient presents with   Fatigue    Dustin Hamilton is a 82 y.o. male.  82 year old male with past medical history of hypertension CVA, BPH presents to the ED with a chief complaint of fatigue.  Patient reports he had a coughing fit episode earlier this evening, states that his wife wanted him to be evaluated in the emergency department.  He recently had a procedure for treatment of his BPH such as prostate artery embolization on 07/31/2022, three days ago. His wife is concern that he might be infected, the urine catheter was pulled out and patient is voiding on his own. No fever, no abdominal pain, no urinary symptoms or other complaints.   The history is provided by the patient.       Home Medications Prior to Admission medications   Medication Sig Start Date End Date Taking? Authorizing Provider  acetaminophen (TYLENOL) 325 MG tablet Take 2 tablets (650 mg total) by mouth every 6 (six) hours as needed for moderate pain. 02/03/22   Jaynee Eagles, PA-C  allopurinol (ZYLOPRIM) 100 MG tablet Take 1 tablet (100 mg total) by mouth daily. 04/05/21 07/31/22  Pokhrel, Corrie Mckusick, MD  amLODipine (NORVASC) 2.5 MG tablet Take 2.5 mg by mouth daily.    [provider]  brimonidine (ALPHAGAN) 0.2 % ophthalmic solution Place 1 drop into both eyes 2 (two) times daily.    [provider]  diclofenac Sodium (VOLTAREN) 1 % GEL Apply 4 g topically 4 (four) times daily. Patient taking differently: Apply 4 g topically daily as needed (to affected sites- for pain). 06/05/21   Deno Etienne, DO  docusate sodium (COLACE) 100 MG capsule Take 1 capsule (100 mg total) by mouth 2 (two) times daily. 07/31/22   Lura Em, PA  dutasteride (AVODART) 0.5 MG capsule Take 0.5 mg by mouth daily.    [provider]  ibuprofen (ADVIL) 800 MG  tablet Take 1 tablet (800 mg total) by mouth every 8 (eight) hours as needed. 07/31/22   Lura Em, PA  latanoprost (XALATAN) 0.005 % ophthalmic solution Place 1 drop into both eyes at bedtime.     [provider]  lidocaine (LIDODERM) 5 % Place 1 patch onto the skin daily as needed (back pain). Remove & Discard patch within 12 hours or as directed by MD    [provider]  memantine (NAMENDA) 10 MG tablet Take 20 mg by mouth in the morning and at bedtime.    [provider]  metoprolol succinate (TOPROL-XL) 25 MG 24 hr tablet Take 0.5 tablets (12.5 mg total) by mouth at bedtime. 05/31/20   Lilland, Alana, DO  Naphazoline HCl (CLEAR EYES OP) Place 2 drops into both eyes daily as needed (dry eyes).    [provider]  Nutritional Supplements (Bieber PO) Take 237 mLs by mouth daily as needed (appetite, nutrition). Clear/apple    [provider]  oxyCODONE (ROXICODONE) 5 MG immediate release tablet Take 1 tablet (5 mg total) by mouth every 6 (six) hours as needed for up to 5 days for severe pain. 07/31/22 08/05/22  Lura Em, PA  polyethylene glycol (MIRALAX / GLYCOLAX) 17 g packet Take 17 g by mouth daily. Patient taking differently: Take 17 g by mouth daily as needed for mild constipation. 05/01/22   Darl Householder,  Lujean Rave, MD  predniSONE (DELTASONE) 50 MG tablet Take 1 tablet (50 mg total) by mouth daily with breakfast for 5 days. 07/31/22 08/05/22  Lura Em, PA  sulfamethoxazole-trimethoprim (BACTRIM) 400-80 MG tablet Take 1 tablet by mouth 2 (two) times daily for 5 days. 07/31/22 08/05/22  Lura Em, PA  tamsulosin (FLOMAX) 0.4 MG CAPS capsule Take 0.4 mg by mouth at bedtime.    [provider]  tiZANidine (ZANAFLEX) 4 MG tablet Take 4 mg by mouth 2 (two) times daily as needed for muscle spasms. 06/11/21   [provider]      Allergies    Aricept [donepezil], Levetiracetam, Penicillins, and Viagra  [sildenafil]    Review of Systems   Review of Systems  Constitutional:  Negative for chills and fever.  Respiratory:  Positive for cough. Negative for shortness of breath.   Cardiovascular:  Negative for chest pain.  Gastrointestinal:  Negative for abdominal pain, nausea and vomiting.  Genitourinary:  Negative for difficulty urinating, dysuria and flank pain.  Musculoskeletal:  Negative for back pain.  Skin:  Negative for wound.  Neurological:  Negative for light-headedness and headaches.  All other systems reviewed and are negative.   Physical Exam Updated Vital Signs BP (!) 156/86   Pulse 65   Temp 98.7 F (37.1 C) (Oral)   Resp 16   Ht '5\' 11"'$  (1.803 m)   Wt 69.9 kg   SpO2 94%   BMI 21.48 kg/m  Physical Exam Vitals and nursing note reviewed. Exam conducted with a chaperone present.  Constitutional:      Appearance: Normal appearance.  HENT:     Head: Normocephalic and atraumatic.     Mouth/Throat:     Mouth: Mucous membranes are moist.  Eyes:     Pupils: Pupils are equal, round, and reactive to light.  Cardiovascular:     Rate and Rhythm: Normal rate.  Pulmonary:     Effort: Pulmonary effort is normal.  Abdominal:     General: Abdomen is flat.  Genitourinary:      Comments: NT present during exam.  Musculoskeletal:     Cervical back: Normal range of motion and neck supple.  Skin:    General: Skin is warm and dry.  Neurological:     Mental Status: He is alert.     ED Results / Procedures / Treatments   Labs (all labs ordered are listed, but only abnormal results are displayed) Labs Reviewed  URINALYSIS, ROUTINE W REFLEX MICROSCOPIC - Abnormal; Notable for the following components:      Result Value   Color, Urine STRAW (*)    Glucose, UA 150 (*)    Protein, ur 30 (*)    Leukocytes,Ua TRACE (*)    All other components within normal limits  RESP PANEL BY RT-PCR (RSV, FLU A&B, COVID)  [id number]  URINE CULTURE    EKG None  Radiology DG Chest  Portable 1 View  Result Date: 08/02/2022 CLINICAL DATA:  Cough. EXAM: PORTABLE CHEST 1 VIEW COMPARISON:  Chest radiograph dated 07/02/2022. FINDINGS: There is mild eventration of the right hemidiaphragm. No focal consolidation, pleural effusion or pneumothorax. Left pectoral pacemaker device. Stable cardiac silhouette. No acute osseous pathology. IMPRESSION: No active disease. Electronically Signed   By: Anner Crete M.D.   On: 08/02/2022 17:25    Procedures Procedures    Medications Ordered in ED Medications - No data to display  ED Course/ Medical Decision Making/ A&P Clinical Course as of 08/02/22 2011  Sat Aug 02, 2022  2008 Leukocytes,Ua(!): TRACE [JS]    Clinical Course User Index [JS] Janeece Fitting, PA-C                             Medical Decision Making Amount and/or Complexity of Data Reviewed Labs: ordered. Decision-making details documented in ED Course. Radiology: ordered.   Patient presents to the ED via EMS, sent in by his wife for an episode of cough.  Patient did have a intervention of his BPH by undergoing prostate artery embolization approximately 3 days ago.  He was placed on a urine catheter, also given medication such as Bactrim twice daily for 5 days, prednisone, ibuprofen to help with pain.  According to the wife, patient seems somewhat off and had a coughing episode, she was told to call to the hospital if patient had any changes.  He is not endorsing any pain he does not, he has no shortness of breath, he has not had any falls.  No headache, no complaints at this time.  He is hemodynamically stable afebrile, voiding adequately while in the ED via pure wick.  Incision was visualized by me, no signs of erythema, no skin changes, no drainage from the wound.  UA was obtained, however patient is currently on prophylactic antibiotics therefore I doubt infection.  He is alert and oriented x 4.  I do not see a reason for obtaining further blood at this time.   Respiratory panel was negative.  X-ray of his chest without any signs of pneumonia to explain his cough.  His respiratory panel was negative for COVID-19, influenza.  Urinalysis checked with trace of leukocytes, he is currently already taking antibiotics for prophylactic prevention of infection. He is hemodynamically stable with no symptoms. Return precautions discussed at length.   Portions of this note were generated with [employer]. Dictation errors may occur despite best attempts at proofreading.   Final Clinical Impression(s) / ED Diagnoses Final diagnoses:  Other fatigue    Rx / DC Orders ED Discharge Orders     None         Corinna Capra 08/02/22 2011    Davonna Belling, MD 08/02/22 [phone]

## 2022-08-02 NOTE — ED Triage Notes (Signed)
Per [facility] pt coming from home c/o cough and malaise. Wife called stating patient had a bad coughing episode earlier today. Patient reports having recent procedure for prostate. States he has been voiding with no issues today. States he feels much better now.

## 2022-08-04 ENCOUNTER — Encounter (HOSPITAL_COMMUNITY): Payer: Self-pay | Admitting: Interventional Radiology

## 2022-08-04 LAB — URINE CULTURE: Culture: <10000 — AB

## 2022-08-07 ENCOUNTER — Other Ambulatory Visit: Payer: Self-pay | Admitting: Physician Assistant

## 2022-08-07 ENCOUNTER — Other Ambulatory Visit: Payer: Self-pay | Admitting: Interventional Radiology

## 2022-08-07 DIAGNOSIS — N400 Enlarged prostate without lower urinary tract symptoms: Secondary | ICD-10-CM

## 2022-08-07 MED ORDER — PHENAZOPYRIDINE HCL 100 MG PO TABS: 100.0000 mg | ORAL_TABLET | Freq: Every day | ORAL | 0 refills | Status: AC

## 2022-08-08 ENCOUNTER — Ambulatory Visit
Admission: RE | Admit: 2022-08-08 | Discharge: 2022-08-08 | Disposition: A | Discharge status: Home / Self Care | Payer: Medicare PPO | Source: Ambulatory Visit | Attending: Interventional Radiology | Admitting: Interventional Radiology

## 2022-08-08 DIAGNOSIS — N400 Enlarged prostate without lower urinary tract symptoms: Secondary | ICD-10-CM

## 2022-08-08 HISTORY — PX: IR RADIOLOGIST EVAL & MGMT: IMG5224

## 2022-08-08 NOTE — Progress Notes (Signed)
Chief Complaint: LUTS secondary to BPH, SP prostate artery embolization  Referring Physician(s): Dustin Hamilton  [facility]   History of Present Illness: Dustin Hamilton is an 82 y.o. male presenting to [facility] today, SP prostate artery embolization 07/31/22 for treatment of BPH/LUTS.    Dustin Hamilton joins [place] today by telephone, as this appointment is scheduled earlier than expected given symptoms.  His wife received the phone call for the appointment today, and provides the interval history/discussion. We confirmed his identity with 2 personal identifiers.    We treated Dustin Hamilton Thursday 07/31/22 at [facility], with right CFA access and bilateral prostate artery embolization.  He was discharged on the same day to home.    Before he was treated, we had a balloon retention urinary catheter placed, and a plan in place to have a voiding trial in a week or so.  Over the first night after treatment, 2/29, the catheter broke, and he was in touch with my partner Dustin Hamilton regarding the displaced urinary catheter.  We confirmed that all of the components were removed and discarded.  He was voiding normally the day after, so we did not have him have the catheter replaced.   Then, yesterday 08/07/22, they contacted our clinic, with complaint of "burning" with urination.  We initiated a pyridium Rx for him yesterday.   Today his wife confirms that they have picked up the pyridium Rx and have started it last night.  She is describing burning with urination.  No complain of pressure or spasm.  No fevers/rigors/chills.  No foul smelling urine or concern for UTI.  No hematuria or tissue expulsion.  He seems to be emptying his bladder. At the time of our conversation, he is sleeping.         Past Medical History:  Diagnosis Date   BPH (benign prostatic hyperplasia)    Cerebrovascular disease    Clostridium difficile diarrhea    Congenital anomaly of diaphragm    Elevated PSA     Glaucoma, both eyes    Hemorrhoid    Hepatitis B surface antigen positive    02-20-2011   History of adenomatous polyp of colon    2007, 2009 and 2013  tubular adenoma's   History of alcohol abuse    quit 1963   History of cerebral parenchymal hemorrhage    01/ 2006  left occiptial lobe related to hypertensive crisis   History of CVA (cerebrovascular accident)    09-12-2012  left hippocampus/ amygdala junction and per MRI old white matter infarcts--  per pt residual short- term memory issues   History of fatty infiltration of liver hx visit's at [facility] , last visit 05/ 2014   elvated LFT's ,  via liver bx 2004 related to hx alcohol and drug abuse (quit 1964)   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   HTN (hypertension)    Renal cyst, left    Stroke ([place])    hx of 3 strokes in past    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    Urethral lesion    urethral mass    Past Surgical History:  Procedure Laterality Date   CARDIOVASCULAR STRESS TEST  05/05/2007   normal nuclear study w/ no ischemia/  normal LV fucntion and wall motion , ef60%   COLONOSCOPY  last one 04-06-2012   CYSTO/  LEFT RETROGRADE PYELOGRAM/ CYTOLOGY  WASHINGS/  URETEROSCOPY  03/05/2000   INGUINAL HERNIA REPAIR Bilateral 1965 and 1980's   IR 3D INDEPENDENT WKST  07/31/2022   IR ANGIOGRAM PELVIS SELECTIVE OR SUPRASELECTIVE  07/31/2022   IR ANGIOGRAM SELECTIVE EACH ADDITIONAL VESSEL  07/31/2022   IR ANGIOGRAM SELECTIVE EACH ADDITIONAL VESSEL  07/31/2022   IR ANGIOGRAM SELECTIVE EACH ADDITIONAL VESSEL  07/31/2022   IR ANGIOGRAM SELECTIVE EACH ADDITIONAL VESSEL  07/31/2022   IR EMBO TUMOR ORGAN ISCHEMIA INFARCT INC GUIDE ROADMAPPING  07/31/2022   IR RADIOLOGIST EVAL & MGMT  06/26/2022   IR US GUIDE VASC ACCESS RIGHT  07/31/2022   LAPAROSCOPIC INGUINAL HERNIA WITH UMBILICAL HERNIA Right [id number]   LIVER BIOPSY  1980's and 2004   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    Procedure: PACEMAKER IMPLANT;  Surgeon: Dustin Lance, MD;  Location: [facility];  Service: Cardiovascular;  Laterality: N/A;   SVT ABLATION N/A 11/15/2018   Procedure: SVT ABLATION;  Surgeon: Dustin Lance, MD;  Location: [facility];  Service: Cardiovascular;  Laterality: N/A;   TRANSTHORACIC ECHOCARDIOGRAM  09/13/2012   moderate LVH,  ef 60-65%/     TRANSURETHRAL RESECTION OF BLADDER TUMOR N/A 08/11/2016   Procedure: TRANSURETHRAL RESECTION OF BLADDER TUMOR (TURBT);  Surgeon: Dustin Gustin, MD;  Location: [facility];  Service: Urology;  Laterality: N/A;    Allergies: Aricept [donepezil], Levetiracetam, Penicillins, and Viagra [sildenafil]  Medications: Prior to Admission medications   Medication Sig Start Date End Date Taking? Authorizing Provider  acetaminophen (TYLENOL) 325 MG tablet Take 2 tablets (650 mg total) by mouth every 6 (six) hours as needed for moderate pain. 02/03/22   Dustin Eagles, PA-C  allopurinol (ZYLOPRIM) 100 MG tablet Take 1 tablet (100 mg total) by mouth daily. 04/05/21 07/31/22  Pokhrel, Corrie Mckusick, MD  amLODipine (NORVASC) 2.5 MG tablet Take 2.5 mg by mouth daily.    [provider]  brimonidine (ALPHAGAN) 0.2 % ophthalmic solution Place 1 drop into both eyes 2 (two) times daily.    [provider]  diclofenac Sodium (VOLTAREN) 1 % GEL Apply 4 g topically 4 (four) times daily. Patient taking differently: Apply 4 g topically daily as needed (to affected sites- for pain). 06/05/21   Dustin Etienne, DO  docusate sodium (COLACE) 100 MG capsule Take 1 capsule (100 mg total) by mouth 2 (two) times daily. 07/31/22   Dustin Em, PA  dutasteride (AVODART) 0.5 MG capsule Take 0.5 mg by mouth daily.    [provider]  ibuprofen (ADVIL) 800 MG tablet Take 1 tablet (800 mg total) by mouth every 8 (eight) hours as needed. 07/31/22   Dustin Em, PA  latanoprost (XALATAN) 0.005 % ophthalmic solution Place 1 drop into  both eyes at bedtime.     [provider]  lidocaine (LIDODERM) 5 % Place 1 patch onto the skin daily as needed (back pain). Remove & Discard patch within 12 hours or as directed by MD    [provider]  memantine (NAMENDA) 10 MG tablet Take 20 mg by mouth in the morning and at bedtime.    [provider]  metoprolol succinate (TOPROL-XL) 25 MG 24 hr tablet Take 0.5 tablets (12.5 mg total) by mouth at bedtime. 05/31/20   Lilland, Alana, DO  Naphazoline HCl (CLEAR EYES OP) Place 2 drops into both eyes daily as needed (dry eyes).    [provider]  Nutritional Supplements (Swartz PO) Take 237 mLs by mouth daily  as needed (appetite, nutrition). Clear/apple    [provider]  phenazopyridine (PYRIDIUM) 100 MG tablet Take 1 tablet (100 mg total) by mouth daily for 7 days. 08/07/22 08/14/22  Ardis Rowan, PA-C  polyethylene glycol (MIRALAX / GLYCOLAX) 17 g packet Take 17 g by mouth daily. Patient taking differently: Take 17 g by mouth daily as needed for mild constipation. 05/01/22   Drenda Freeze, MD  tamsulosin (FLOMAX) 0.4 MG CAPS capsule Take 0.4 mg by mouth at bedtime.    [provider]  tiZANidine (ZANAFLEX) 4 MG tablet Take 4 mg by mouth 2 (two) times daily as needed for muscle spasms. 06/11/21   [provider]     Family History  Problem Relation Age of Onset   Rheum arthritis Mother    Diabetes Mother    Stroke Mother    Heart attack Mother    Kidney failure Mother    Heart attack Father    Heart disease Maternal Grandmother    Rheum arthritis Maternal Grandmother    Diabetes Maternal Grandmother    Stroke Maternal Grandmother    Colon cancer Neg Hx     Social History   Socioeconomic History   Marital status: Married    Spouse name: [name]   Number of children: 1   Years of education: College   Highest education level: Not on file  Occupational History   Occupation: Geophysicist/field seismologist   Tobacco Use   Smoking status: Former    Packs/day: 1.00    Years: 5.00    Total pack years: 5.00    Types: Cigarettes    Quit date: 11/30/1981    Years since quitting: 40.7   Smokeless tobacco: Never  Vaping Use   Vaping Use: Never used  Substance and Sexual Activity   Alcohol use: No    Alcohol/week: 0.0 standard drinks of alcohol    Comment: hx abuse -- quit:  1963   Drug use: No    Comment: 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   Caffeine Use:  Tea, lots   Social Determinants of Health   Financial Resource Strain: Not on file  Food Insecurity: Not on file  Transportation Needs: Not on file  Physical Activity: Not on file  Stress: Not on file  Social Connections: Not on file      Review of Systems  Review of Systems: A 12 point ROS discussed and pertinent positives are indicated in the HPI above.  All other systems are negative.  Advance Care Plan: The advanced care plan/surrogate decision maker was discussed at the time of visit and documented in the medical record.    Physical Exam No direct physical exam was performed (except for noted visual exam findings with Video Visits).   Vital Signs: There were no vitals taken for this visit.  Imaging: DG Chest Portable 1 View  Result Date: 08/02/2022 CLINICAL DATA:  Cough. EXAM: PORTABLE CHEST 1 VIEW COMPARISON:  Chest radiograph dated 07/02/2022. FINDINGS: There is mild eventration of the right hemidiaphragm. No focal consolidation, pleural effusion or pneumothorax. Left pectoral pacemaker device. Stable cardiac silhouette. No acute osseous pathology. IMPRESSION: No active disease. Electronically Signed   By: Anner Crete M.D.   On: 08/02/2022 17:25   IR EMBO TUMOR ORGAN ISCHEMIA INFARCT INC GUIDE ROADMAPPING  Result Date:  07/31/2022 INDICATION: 82 year old male presents  for prostate gland embolization EXAM: ULTRASOUND-GUIDED ACCESS RIGHT COMMON FEMORAL ARTERY PELVIC ANGIOGRAM SUPER SELECTIVE CATHETER PLACEMENT INTO THE LEFT SUPERIOR VESICLE ARTERY, LEFT PROSTATE ARTERY, AND RIGHT PROSTATE ARTERY EMBOLIZATION OF BILATERAL PROSTATE ARTERIES CELT FOR HEMOSTASIS MEDICATIONS: 5 mg verapamil intra arterial ANESTHESIA/SEDATION: Moderate (conscious) sedation was employed during this procedure. A total of Versed 2.0 mg and Fentanyl 100 mcg was administered intravenously by the radiology nurse. Total intra-service moderate Sedation Time: 134 minutes. The patient's level of consciousness and vital signs were monitored continuously by radiology nursing throughout the procedure under my direct supervision. CONTRAST:  150 CC FLUOROSCOPY: Radiation Exposure Index (as provided by the fluoroscopic device): 34 MINUTES, 48 SECONDS, 0000000 mGy Kerma COMPLICATIONS: None PROCEDURE: Informed consent was obtained from the patient following explanation of the procedure, risks, benefits and alternatives. The patient understands, agrees and consents for the procedure. All questions were addressed. A time out was performed prior to the initiation of the procedure. Maximal barrier sterile technique utilized including caps, mask, sterile gowns, sterile gloves, large sterile drape, hand hygiene, and Betadine prep. Ultrasound survey of the right inguinal region was performed with images stored and sent to PACs, confirming patency of the vessel. A micropuncture needle was used access the right common femoral artery under ultrasound. With excellent arterial blood flow returned, and an .018 micro wire was passed through the needle, observed enter the abdominal aorta under fluoroscopy. The needle was removed, and a micropuncture sheath was placed over the wire. The inner dilator and wire were removed, and an 035 Bentson wire was advanced under fluoroscopy into the abdominal aorta. The sheath was removed and a standard  5 [place] vascular sheath was placed. The dilator was removed and the sheath was flushed. Omni Flush catheter was passed over the wire into the lower abdominal aorta. Catheter was used to perform pelvic angiogram and then used to select the left common iliac artery. Bentson wire was passed into the left external iliac artery. Given the tortuosity, we could not achieve a distal wire position in the Omni Flush catheter was prone to prolapse into the abdominal aorta. Bentson wire was removed and a stiff glide was placed. Tortuosity again was prohibitive in having a distal wire position with a stiff glide catheter, with the Omni Flush catheter prolapsing into the aorta. C2 catheter was then passed on the Glidewire with an attempt to select the left common iliac artery. The small caliber distal aorta and the tortuosity precluded selection of the common iliac artery with the C2 Cobra catheter. Exchange was then made again for the Omni Flush catheter. A standard Glidewire was passed through the Omni Flush catheter, achieving a distal position within the external iliac artery. The Omni Flush catheter was removed. A 4 French glide catheter was then advanced on the standard Glidewire to the distal external iliac artery. Glidewire was removed and the Bentson wire was placed into the external iliac artery. Glide cath was then removed from the Bentson wire and the 15 cm prostate arterial catheter was passed up in over the bifurcation to form the re-curve. Bentson wire was removed. Catheter was withdrawn to the origin of the internal iliac artery and then navigated into the internal iliac artery to the bifurcation. Angiogram was performed, 35 degrees ipsilateral oblique with 10 degrees caudal-cranial. 3 cc/second for a total of 15 cc of contrast. After evaluation of the arteries, we then performed some additional hand injections of the bifurcation with different projections to identified the  artery of interest. Alpha Progreat  microcatheter was then used to select the first artery of interest with 014 fathom wire. Once we elected the artery of interest, angiogram was performed in the ipsilateral oblique and the AP. Flat panel CT was then performed. 0.3 cc/second for a total of 10 cc of contrast, with 10 second delay before imaging. We reviewed the images, which showed that the first artery of selection and injection was in fact superior vesical artery. Microcatheter was withdrawn to the second artery of interest. Microcatheter was advanced with the fathom wire into the proximal target artery. Angiogram in the AP and ipsilateral oblique projection was performed. A flat panel spin CT was performed. 0.3 cc/second for a total of 10 cc of contrast, with 10 second delay before imaging. We reviewed these images, which showed [place] that the current artery of interest was in fact the left prostate artery. The initial catheter position demonstrated some nontarget opacification to the bladder base/rectum. The catheter was advanced further on the fathom wire, and with the more distal position, there was preferential filling of the anterolateral division of the prostate artery. Before embolization, we gently infused 2.5 mg verapamil intra arterial through the microcatheter. We then performed embolization with 300-500 micron embosphere, with a total 15 cc solution, a 30% dilution (5 cc of embosphere and 10 cc of contrast). Approximately 30% of the vial was used for embolization of the left prostate half. Once stasis was achieved, microcatheter was withdrawn and we interrogated 1 additional branch of interest. This was not embolized as there appear to be some contamination/cross-filling of rectum/bladder base. Microcatheter was removed. The prostate arterial catheter was withdrawn into the abdominal aorta and used to select the ipsilateral iliac artery. Catheter was advanced into the internal iliac artery with the use of the standard Glidewire. Once the  catheter was in position angiogram was performed. Angiogram was performed first in the AP position, and then in the ipsilateral oblique, 35 degrees with 10 degrees of caudal-cranial angulation. We reviewed images, and elected to pursue selective angiogram of the most tortuous of the branch arteries arising from the proximal gluteal pudendal trunk. Microcatheter was advanced with a synchro wire into the artery of interest. Once we confirmed catheter position within the artery the wire was removed. Angiogram was performed in the ipsilateral oblique and AP projection. Flat panel CT was performed. 0.3 cc/second for a total of 10 cc of contrast, with 10 second delay before imaging. Intra arterial verapamil was administered on the right, 2.5 mg. We then performed embolization with 300-500 micron embosphere. Of the 30% dilution, we used a proximally additional 30% of the vial. A total of 10 cc of EMBO solution was used for the entire prostate. One stasis was achieved all catheters and wires were removed. Celt was deployed for hemostasis. Patient tolerated the procedure well and remained hemodynamically stable throughout. No complications were encountered and no significant blood loss. FINDINGS: Ultrasound demonstrates patent right common femoral artery Angiogram of the pelvis demonstrates tortuosity of the bilateral iliac arteries of adequate caliber. On the left, there is standard type a internal iliac artery configuration, with posterior division as the superior gluteal artery in the anterior division as the gluteal-pudendal trunk. No obturator artery was identified as the prior CT shows a replaced operator from the external iliac artery. The prostate artery was identified as a type 1 artery, associated with the superior vesicle arteries. Embolization was performed to stasis. On the right, there is standard type a internal iliac  artery configuration. Posterior division is the superior gluteal artery in the anterior  division as a gluteal pudendal trunk. The prostate artery on the right appears to be a type 2 configuration, arising from the gluteal-pudendal trunk separate from the superior vesicle arteries. Embolization was performed to stasis. IMPRESSION: Status post ultrasound guided access right common femoral artery for pelvic angiogram, bilateral super selective catheter placement into the prostate arteries, and embolization to stasis after confirmation with flat panel CT imaging. Celt for hemostasis Signed, Dulcy Fanny. Nadene Rubins, RPVI [facility] Radiology Electronically Signed   By: Corrie Hamilton D.O.   On: 07/31/2022 15:48   IR US Guide Vasc Access Right  Result Date: 07/31/2022 INDICATION: 82 year old male presents for prostate gland embolization EXAM: ULTRASOUND-GUIDED ACCESS RIGHT COMMON FEMORAL ARTERY PELVIC ANGIOGRAM SUPER SELECTIVE CATHETER PLACEMENT INTO THE LEFT SUPERIOR VESICLE ARTERY, LEFT PROSTATE ARTERY, AND RIGHT PROSTATE ARTERY EMBOLIZATION OF BILATERAL PROSTATE ARTERIES CELT FOR HEMOSTASIS MEDICATIONS: 5 mg verapamil intra arterial ANESTHESIA/SEDATION: Moderate (conscious) sedation was employed during this procedure. A total of Versed 2.0 mg and Fentanyl 100 mcg was administered intravenously by the radiology nurse. Total intra-service moderate Sedation Time: 134 minutes. The patient's level of consciousness and vital signs were monitored continuously by radiology nursing throughout the procedure under my direct supervision. CONTRAST:  150 CC FLUOROSCOPY: Radiation Exposure Index (as provided by the fluoroscopic device): 34 MINUTES, 48 SECONDS, 0000000 mGy Kerma COMPLICATIONS: None PROCEDURE: Informed consent was obtained from the patient following explanation of the procedure, risks, benefits and alternatives. The patient understands, agrees and consents for the procedure. All questions were addressed. A time out was performed prior to the initiation  of the procedure. Maximal barrier sterile technique utilized including caps, mask, sterile gowns, sterile gloves, large sterile drape, hand hygiene, and Betadine prep. Ultrasound survey of the right inguinal region was performed with images stored and sent to PACs, confirming patency of the vessel. A micropuncture needle was used access the right common femoral artery under ultrasound. With excellent arterial blood flow returned, and an .018 micro wire was passed through the needle, observed enter the abdominal aorta under fluoroscopy. The needle was removed, and a micropuncture sheath was placed over the wire. The inner dilator and wire were removed, and an 035 Bentson wire was advanced under fluoroscopy into the abdominal aorta. The sheath was removed and a standard 5 [place] vascular sheath was placed. The dilator was removed and the sheath was flushed. Omni Flush catheter was passed over the wire into the lower abdominal aorta. Catheter was used to perform pelvic angiogram and then used to select the left common iliac artery. Bentson wire was passed into the left external iliac artery. Given the tortuosity, we could not achieve a distal wire position in the Omni Flush catheter was prone to prolapse into the abdominal aorta. Bentson wire was removed and a stiff glide was placed. Tortuosity again was prohibitive in having a distal wire position with a stiff glide catheter, with the Omni Flush catheter prolapsing into the aorta. C2 catheter was then passed on the Glidewire with an attempt to select the left common iliac artery. The small caliber distal aorta and the tortuosity precluded selection of the common iliac artery with the C2 Cobra catheter. Exchange was then made again for the Omni Flush catheter. A standard Glidewire was passed through the Omni Flush catheter, achieving a distal position within the external iliac artery. The Omni Flush catheter was removed. A 4 French glide catheter  was then advanced on  the standard Glidewire to the distal external iliac artery. Glidewire was removed and the Bentson wire was placed into the external iliac artery. Glide cath was then removed from the Bentson wire and the 15 cm prostate arterial catheter was passed up in over the bifurcation to form the re-curve. Bentson wire was removed. Catheter was withdrawn to the origin of the internal iliac artery and then navigated into the internal iliac artery to the bifurcation. Angiogram was performed, 35 degrees ipsilateral oblique with 10 degrees caudal-cranial. 3 cc/second for a total of 15 cc of contrast. After evaluation of the arteries, we then performed some additional hand injections of the bifurcation with different projections to identified the artery of interest. Alpha Progreat microcatheter was then used to select the first artery of interest with 014 fathom wire. Once we elected the artery of interest, angiogram was performed in the ipsilateral oblique and the AP. Flat panel CT was then performed. 0.3 cc/second for a total of 10 cc of contrast, with 10 second delay before imaging. We reviewed the images, which showed that the first artery of selection and injection was in fact superior vesical artery. Microcatheter was withdrawn to the second artery of interest. Microcatheter was advanced with the fathom wire into the proximal target artery. Angiogram in the AP and ipsilateral oblique projection was performed. A flat panel spin CT was performed. 0.3 cc/second for a total of 10 cc of contrast, with 10 second delay before imaging. We reviewed these images, which showed [place] that the current artery of interest was in fact the left prostate artery. The initial catheter position demonstrated some nontarget opacification to the bladder base/rectum. The catheter was advanced further on the fathom wire, and with the more distal position, there was preferential filling of the anterolateral division of the prostate artery. Before  embolization, we gently infused 2.5 mg verapamil intra arterial through the microcatheter. We then performed embolization with 300-500 micron embosphere, with a total 15 cc solution, a 30% dilution (5 cc of embosphere and 10 cc of contrast). Approximately 30% of the vial was used for embolization of the left prostate half. Once stasis was achieved, microcatheter was withdrawn and we interrogated 1 additional branch of interest. This was not embolized as there appear to be some contamination/cross-filling of rectum/bladder base. Microcatheter was removed. The prostate arterial catheter was withdrawn into the abdominal aorta and used to select the ipsilateral iliac artery. Catheter was advanced into the internal iliac artery with the use of the standard Glidewire. Once the catheter was in position angiogram was performed. Angiogram was performed first in the AP position, and then in the ipsilateral oblique, 35 degrees with 10 degrees of caudal-cranial angulation. We reviewed images, and elected to pursue selective angiogram of the most tortuous of the branch arteries arising from the proximal gluteal pudendal trunk. Microcatheter was advanced with a synchro wire into the artery of interest. Once we confirmed catheter position within the artery the wire was removed. Angiogram was performed in the ipsilateral oblique and AP projection. Flat panel CT was performed. 0.3 cc/second for a total of 10 cc of contrast, with 10 second delay before imaging. Intra arterial verapamil was administered on the right, 2.5 mg. We then performed embolization with 300-500 micron embosphere. Of the 30% dilution, we used a proximally additional 30% of the vial. A total of 10 cc of EMBO solution was used for the entire prostate. One stasis was achieved all catheters and wires were removed. Celt  was deployed for hemostasis. Patient tolerated the procedure well and remained hemodynamically stable throughout. No complications were encountered  and no significant blood loss. FINDINGS: Ultrasound demonstrates patent right common femoral artery Angiogram of the pelvis demonstrates tortuosity of the bilateral iliac arteries of adequate caliber. On the left, there is standard type a internal iliac artery configuration, with posterior division as the superior gluteal artery in the anterior division as the gluteal-pudendal trunk. No obturator artery was identified as the prior CT shows a replaced operator from the external iliac artery. The prostate artery was identified as a type 1 artery, associated with the superior vesicle arteries. Embolization was performed to stasis. On the right, there is standard type a internal iliac artery configuration. Posterior division is the superior gluteal artery in the anterior division as a gluteal pudendal trunk. The prostate artery on the right appears to be a type 2 configuration, arising from the gluteal-pudendal trunk separate from the superior vesicle arteries. Embolization was performed to stasis. IMPRESSION: Status post ultrasound guided access right common femoral artery for pelvic angiogram, bilateral super selective catheter placement into the prostate arteries, and embolization to stasis after confirmation with flat panel CT imaging. Celt for hemostasis Signed, Dulcy Fanny. Nadene Rubins, RPVI [facility] Radiology Electronically Signed   By: Corrie Hamilton D.O.   On: 07/31/2022 15:48   IR Angiogram Pelvis Selective Or Supraselective  Result Date: 07/31/2022 INDICATION: 82 year old male presents for prostate gland embolization EXAM: ULTRASOUND-GUIDED ACCESS RIGHT COMMON FEMORAL ARTERY PELVIC ANGIOGRAM SUPER SELECTIVE CATHETER PLACEMENT INTO THE LEFT SUPERIOR VESICLE ARTERY, LEFT PROSTATE ARTERY, AND RIGHT PROSTATE ARTERY EMBOLIZATION OF BILATERAL PROSTATE ARTERIES CELT FOR HEMOSTASIS MEDICATIONS: 5 mg verapamil intra arterial ANESTHESIA/SEDATION: Moderate  (conscious) sedation was employed during this procedure. A total of Versed 2.0 mg and Fentanyl 100 mcg was administered intravenously by the radiology nurse. Total intra-service moderate Sedation Time: 134 minutes. The patient's level of consciousness and vital signs were monitored continuously by radiology nursing throughout the procedure under my direct supervision. CONTRAST:  150 CC FLUOROSCOPY: Radiation Exposure Index (as provided by the fluoroscopic device): 34 MINUTES, 48 SECONDS, 0000000 mGy Kerma COMPLICATIONS: None PROCEDURE: Informed consent was obtained from the patient following explanation of the procedure, risks, benefits and alternatives. The patient understands, agrees and consents for the procedure. All questions were addressed. A time out was performed prior to the initiation of the procedure. Maximal barrier sterile technique utilized including caps, mask, sterile gowns, sterile gloves, large sterile drape, hand hygiene, and Betadine prep. Ultrasound survey of the right inguinal region was performed with images stored and sent to PACs, confirming patency of the vessel. A micropuncture needle was used access the right common femoral artery under ultrasound. With excellent arterial blood flow returned, and an .018 micro wire was passed through the needle, observed enter the abdominal aorta under fluoroscopy. The needle was removed, and a micropuncture sheath was placed over the wire. The inner dilator and wire were removed, and an 035 Bentson wire was advanced under fluoroscopy into the abdominal aorta. The sheath was removed and a standard 5 [place] vascular sheath was placed. The dilator was removed and the sheath was flushed. Omni Flush catheter was passed over the wire into the lower abdominal aorta. Catheter was used to perform pelvic angiogram and then used to select the left common iliac artery. Bentson wire was passed into the left external iliac artery. Given the tortuosity, we could not  achieve a distal wire position in the  Omni Flush catheter was prone to prolapse into the abdominal aorta. Bentson wire was removed and a stiff glide was placed. Tortuosity again was prohibitive in having a distal wire position with a stiff glide catheter, with the Omni Flush catheter prolapsing into the aorta. C2 catheter was then passed on the Glidewire with an attempt to select the left common iliac artery. The small caliber distal aorta and the tortuosity precluded selection of the common iliac artery with the C2 Cobra catheter. Exchange was then made again for the Omni Flush catheter. A standard Glidewire was passed through the Omni Flush catheter, achieving a distal position within the external iliac artery. The Omni Flush catheter was removed. A 4 French glide catheter was then advanced on the standard Glidewire to the distal external iliac artery. Glidewire was removed and the Bentson wire was placed into the external iliac artery. Glide cath was then removed from the Bentson wire and the 15 cm prostate arterial catheter was passed up in over the bifurcation to form the re-curve. Bentson wire was removed. Catheter was withdrawn to the origin of the internal iliac artery and then navigated into the internal iliac artery to the bifurcation. Angiogram was performed, 35 degrees ipsilateral oblique with 10 degrees caudal-cranial. 3 cc/second for a total of 15 cc of contrast. After evaluation of the arteries, we then performed some additional hand injections of the bifurcation with different projections to identified the artery of interest. Alpha Progreat microcatheter was then used to select the first artery of interest with 014 fathom wire. Once we elected the artery of interest, angiogram was performed in the ipsilateral oblique and the AP. Flat panel CT was then performed. 0.3 cc/second for a total of 10 cc of contrast, with 10 second delay before imaging. We reviewed the images, which showed that the first  artery of selection and injection was in fact superior vesical artery. Microcatheter was withdrawn to the second artery of interest. Microcatheter was advanced with the fathom wire into the proximal target artery. Angiogram in the AP and ipsilateral oblique projection was performed. A flat panel spin CT was performed. 0.3 cc/second for a total of 10 cc of contrast, with 10 second delay before imaging. We reviewed these images, which showed [place] that the current artery of interest was in fact the left prostate artery. The initial catheter position demonstrated some nontarget opacification to the bladder base/rectum. The catheter was advanced further on the fathom wire, and with the more distal position, there was preferential filling of the anterolateral division of the prostate artery. Before embolization, we gently infused 2.5 mg verapamil intra arterial through the microcatheter. We then performed embolization with 300-500 micron embosphere, with a total 15 cc solution, a 30% dilution (5 cc of embosphere and 10 cc of contrast). Approximately 30% of the vial was used for embolization of the left prostate half. Once stasis was achieved, microcatheter was withdrawn and we interrogated 1 additional branch of interest. This was not embolized as there appear to be some contamination/cross-filling of rectum/bladder base. Microcatheter was removed. The prostate arterial catheter was withdrawn into the abdominal aorta and used to select the ipsilateral iliac artery. Catheter was advanced into the internal iliac artery with the use of the standard Glidewire. Once the catheter was in position angiogram was performed. Angiogram was performed first in the AP position, and then in the ipsilateral oblique, 35 degrees with 10 degrees of caudal-cranial angulation. We reviewed images, and elected to pursue selective angiogram of the most tortuous  of the branch arteries arising from the proximal gluteal pudendal trunk. Microcatheter  was advanced with a synchro wire into the artery of interest. Once we confirmed catheter position within the artery the wire was removed. Angiogram was performed in the ipsilateral oblique and AP projection. Flat panel CT was performed. 0.3 cc/second for a total of 10 cc of contrast, with 10 second delay before imaging. Intra arterial verapamil was administered on the right, 2.5 mg. We then performed embolization with 300-500 micron embosphere. Of the 30% dilution, we used a proximally additional 30% of the vial. A total of 10 cc of EMBO solution was used for the entire prostate. One stasis was achieved all catheters and wires were removed. Celt was deployed for hemostasis. Patient tolerated the procedure well and remained hemodynamically stable throughout. No complications were encountered and no significant blood loss. FINDINGS: Ultrasound demonstrates patent right common femoral artery Angiogram of the pelvis demonstrates tortuosity of the bilateral iliac arteries of adequate caliber. On the left, there is standard type a internal iliac artery configuration, with posterior division as the superior gluteal artery in the anterior division as the gluteal-pudendal trunk. No obturator artery was identified as the prior CT shows a replaced operator from the external iliac artery. The prostate artery was identified as a type 1 artery, associated with the superior vesicle arteries. Embolization was performed to stasis. On the right, there is standard type a internal iliac artery configuration. Posterior division is the superior gluteal artery in the anterior division as a gluteal pudendal trunk. The prostate artery on the right appears to be a type 2 configuration, arising from the gluteal-pudendal trunk separate from the superior vesicle arteries. Embolization was performed to stasis. IMPRESSION: Status post ultrasound guided access right common femoral artery for pelvic angiogram, bilateral super selective catheter  placement into the prostate arteries, and embolization to stasis after confirmation with flat panel CT imaging. Celt for hemostasis Signed, Dulcy Fanny. Nadene Rubins, RPVI [facility] and Interventional Radiology Specialists [facility] Radiology Electronically Signed   By: Corrie Hamilton D.O.   On: 07/31/2022 15:48   IR Angiogram Selective Each Additional Vessel  Result Date: 07/31/2022 INDICATION: 82 year old male presents for prostate gland embolization EXAM: ULTRASOUND-GUIDED ACCESS RIGHT COMMON FEMORAL ARTERY PELVIC ANGIOGRAM SUPER SELECTIVE CATHETER PLACEMENT INTO THE LEFT SUPERIOR VESICLE ARTERY, LEFT PROSTATE ARTERY, AND RIGHT PROSTATE ARTERY EMBOLIZATION OF BILATERAL PROSTATE ARTERIES CELT FOR HEMOSTASIS MEDICATIONS: 5 mg verapamil intra arterial ANESTHESIA/SEDATION: Moderate (conscious) sedation was employed during this procedure. A total of Versed 2.0 mg and Fentanyl 100 mcg was administered intravenously by the radiology nurse. Total intra-service moderate Sedation Time: 134 minutes. The patient's level of consciousness and vital signs were monitored continuously by radiology nursing throughout the procedure under my direct supervision. CONTRAST:  150 CC FLUOROSCOPY: Radiation Exposure Index (as provided by the fluoroscopic device): 34 MINUTES, 48 SECONDS, 0000000 mGy Kerma COMPLICATIONS: None PROCEDURE: Informed consent was obtained from the patient following explanation of the procedure, risks, benefits and alternatives. The patient understands, agrees and consents for the procedure. All questions were addressed. A time out was performed prior to the initiation of the procedure. Maximal barrier sterile technique utilized including caps, mask, sterile gowns, sterile gloves, large sterile drape, hand hygiene, and Betadine prep. Ultrasound survey of the right inguinal region was performed with images stored and sent to PACs, confirming patency of the vessel. A micropuncture needle was used access the right  common femoral artery under ultrasound. With excellent arterial blood flow returned, and an .018  micro wire was passed through the needle, observed enter the abdominal aorta under fluoroscopy. The needle was removed, and a micropuncture sheath was placed over the wire. The inner dilator and wire were removed, and an 035 Bentson wire was advanced under fluoroscopy into the abdominal aorta. The sheath was removed and a standard 5 [place] vascular sheath was placed. The dilator was removed and the sheath was flushed. Omni Flush catheter was passed over the wire into the lower abdominal aorta. Catheter was used to perform pelvic angiogram and then used to select the left common iliac artery. Bentson wire was passed into the left external iliac artery. Given the tortuosity, we could not achieve a distal wire position in the Omni Flush catheter was prone to prolapse into the abdominal aorta. Bentson wire was removed and a stiff glide was placed. Tortuosity again was prohibitive in having a distal wire position with a stiff glide catheter, with the Omni Flush catheter prolapsing into the aorta. C2 catheter was then passed on the Glidewire with an attempt to select the left common iliac artery. The small caliber distal aorta and the tortuosity precluded selection of the common iliac artery with the C2 Cobra catheter. Exchange was then made again for the Omni Flush catheter. A standard Glidewire was passed through the Omni Flush catheter, achieving a distal position within the external iliac artery. The Omni Flush catheter was removed. A 4 French glide catheter was then advanced on the standard Glidewire to the distal external iliac artery. Glidewire was removed and the Bentson wire was placed into the external iliac artery. Glide cath was then removed from the Bentson wire and the 15 cm prostate arterial catheter was passed up in over the bifurcation to form the re-curve. Bentson wire was removed. Catheter was withdrawn to  the origin of the internal iliac artery and then navigated into the internal iliac artery to the bifurcation. Angiogram was performed, 35 degrees ipsilateral oblique with 10 degrees caudal-cranial. 3 cc/second for a total of 15 cc of contrast. After evaluation of the arteries, we then performed some additional hand injections of the bifurcation with different projections to identified the artery of interest. Alpha Progreat microcatheter was then used to select the first artery of interest with 014 fathom wire. Once we elected the artery of interest, angiogram was performed in the ipsilateral oblique and the AP. Flat panel CT was then performed. 0.3 cc/second for a total of 10 cc of contrast, with 10 second delay before imaging. We reviewed the images, which showed that the first artery of selection and injection was in fact superior vesical artery. Microcatheter was withdrawn to the second artery of interest. Microcatheter was advanced with the fathom wire into the proximal target artery. Angiogram in the AP and ipsilateral oblique projection was performed. A flat panel spin CT was performed. 0.3 cc/second for a total of 10 cc of contrast, with 10 second delay before imaging. We reviewed these images, which showed [place] that the current artery of interest was in fact the left prostate artery. The initial catheter position demonstrated some nontarget opacification to the bladder base/rectum. The catheter was advanced further on the fathom wire, and with the more distal position, there was preferential filling of the anterolateral division of the prostate artery. Before embolization, we gently infused 2.5 mg verapamil intra arterial through the microcatheter. We then performed embolization with 300-500 micron embosphere, with a total 15 cc solution, a 30% dilution (5 cc of embosphere and 10 cc of contrast). Approximately  30% of the vial was used for embolization of the left prostate half. Once stasis was achieved,  microcatheter was withdrawn and we interrogated 1 additional branch of interest. This was not embolized as there appear to be some contamination/cross-filling of rectum/bladder base. Microcatheter was removed. The prostate arterial catheter was withdrawn into the abdominal aorta and used to select the ipsilateral iliac artery. Catheter was advanced into the internal iliac artery with the use of the standard Glidewire. Once the catheter was in position angiogram was performed. Angiogram was performed first in the AP position, and then in the ipsilateral oblique, 35 degrees with 10 degrees of caudal-cranial angulation. We reviewed images, and elected to pursue selective angiogram of the most tortuous of the branch arteries arising from the proximal gluteal pudendal trunk. Microcatheter was advanced with a synchro wire into the artery of interest. Once we confirmed catheter position within the artery the wire was removed. Angiogram was performed in the ipsilateral oblique and AP projection. Flat panel CT was performed. 0.3 cc/second for a total of 10 cc of contrast, with 10 second delay before imaging. Intra arterial verapamil was administered on the right, 2.5 mg. We then performed embolization with 300-500 micron embosphere. Of the 30% dilution, we used a proximally additional 30% of the vial. A total of 10 cc of EMBO solution was used for the entire prostate. One stasis was achieved all catheters and wires were removed. Celt was deployed for hemostasis. Patient tolerated the procedure well and remained hemodynamically stable throughout. No complications were encountered and no significant blood loss. FINDINGS: Ultrasound demonstrates patent right common femoral artery Angiogram of the pelvis demonstrates tortuosity of the bilateral iliac arteries of adequate caliber. On the left, there is standard type a internal iliac artery configuration, with posterior division as the superior gluteal artery in the anterior  division as the gluteal-pudendal trunk. No obturator artery was identified as the prior CT shows a replaced operator from the external iliac artery. The prostate artery was identified as a type 1 artery, associated with the superior vesicle arteries. Embolization was performed to stasis. On the right, there is standard type a internal iliac artery configuration. Posterior division is the superior gluteal artery in the anterior division as a gluteal pudendal trunk. The prostate artery on the right appears to be a type 2 configuration, arising from the gluteal-pudendal trunk separate from the superior vesicle arteries. Embolization was performed to stasis. IMPRESSION: Status post ultrasound guided access right common femoral artery for pelvic angiogram, bilateral super selective catheter placement into the prostate arteries, and embolization to stasis after confirmation with flat panel CT imaging. Celt for hemostasis Signed, Dulcy Fanny. Nadene Rubins, RPVI Vascular and Interventional Radiology Specialists [facility] Radiology Electronically Signed   By: Corrie Hamilton D.O.   On: 07/31/2022 15:48   IR Angiogram Selective Each Additional Vessel  Result Date: 07/31/2022 INDICATION: 82 year old male presents for prostate gland embolization EXAM: ULTRASOUND-GUIDED ACCESS RIGHT COMMON FEMORAL ARTERY PELVIC ANGIOGRAM SUPER SELECTIVE CATHETER PLACEMENT INTO THE LEFT SUPERIOR VESICLE ARTERY, LEFT PROSTATE ARTERY, AND RIGHT PROSTATE ARTERY EMBOLIZATION OF BILATERAL PROSTATE ARTERIES CELT FOR HEMOSTASIS MEDICATIONS: 5 mg verapamil intra arterial ANESTHESIA/SEDATION: Moderate (conscious) sedation was employed during this procedure. A total of Versed 2.0 mg and Fentanyl 100 mcg was administered intravenously by the radiology nurse. Total intra-service moderate Sedation Time: 134 minutes. The patient's level of consciousness and vital signs were monitored continuously by radiology nursing throughout the procedure under my direct  supervision. CONTRAST:  150 CC FLUOROSCOPY: Radiation  Exposure Index (as provided by the fluoroscopic device): 34 MINUTES, 48 SECONDS, 0000000 mGy Kerma COMPLICATIONS: None PROCEDURE: Informed consent was obtained from the patient following explanation of the procedure, risks, benefits and alternatives. The patient understands, agrees and consents for the procedure. All questions were addressed. A time out was performed prior to the initiation of the procedure. Maximal barrier sterile technique utilized including caps, mask, sterile gowns, sterile gloves, large sterile drape, hand hygiene, and Betadine prep. Ultrasound survey of the right inguinal region was performed with images stored and sent to PACs, confirming patency of the vessel. A micropuncture needle was used access the right common femoral artery under ultrasound. With excellent arterial blood flow returned, and an .018 micro wire was passed through the needle, observed enter the abdominal aorta under fluoroscopy. The needle was removed, and a micropuncture sheath was placed over the wire. The inner dilator and wire were removed, and an 035 Bentson wire was advanced under fluoroscopy into the abdominal aorta. The sheath was removed and a standard 5 [place] vascular sheath was placed. The dilator was removed and the sheath was flushed. Omni Flush catheter was passed over the wire into the lower abdominal aorta. Catheter was used to perform pelvic angiogram and then used to select the left common iliac artery. Bentson wire was passed into the left external iliac artery. Given the tortuosity, we could not achieve a distal wire position in the Omni Flush catheter was prone to prolapse into the abdominal aorta. Bentson wire was removed and a stiff glide was placed. Tortuosity again was prohibitive in having a distal wire position with a stiff glide catheter, with the Omni Flush catheter prolapsing into the aorta. C2 catheter was then passed on the Glidewire with  an attempt to select the left common iliac artery. The small caliber distal aorta and the tortuosity precluded selection of the common iliac artery with the C2 Cobra catheter. Exchange was then made again for the Omni Flush catheter. A standard Glidewire was passed through the Omni Flush catheter, achieving a distal position within the external iliac artery. The Omni Flush catheter was removed. A 4 French glide catheter was then advanced on the standard Glidewire to the distal external iliac artery. Glidewire was removed and the Bentson wire was placed into the external iliac artery. Glide cath was then removed from the Bentson wire and the 15 cm prostate arterial catheter was passed up in over the bifurcation to form the re-curve. Bentson wire was removed. Catheter was withdrawn to the origin of the internal iliac artery and then navigated into the internal iliac artery to the bifurcation. Angiogram was performed, 35 degrees ipsilateral oblique with 10 degrees caudal-cranial. 3 cc/second for a total of 15 cc of contrast. After evaluation of the arteries, we then performed some additional hand injections of the bifurcation with different projections to identified the artery of interest. Alpha Progreat microcatheter was then used to select the first artery of interest with 014 fathom wire. Once we elected the artery of interest, angiogram was performed in the ipsilateral oblique and the AP. Flat panel CT was then performed. 0.3 cc/second for a total of 10 cc of contrast, with 10 second delay before imaging. We reviewed the images, which showed that the first artery of selection and injection was in fact superior vesical artery. Microcatheter was withdrawn to the second artery of interest. Microcatheter was advanced with the fathom wire into the proximal target artery. Angiogram in the AP and ipsilateral oblique projection was performed.  A flat panel spin CT was performed. 0.3 cc/second for a total of 10 cc of  contrast, with 10 second delay before imaging. We reviewed these images, which showed [place] that the current artery of interest was in fact the left prostate artery. The initial catheter position demonstrated some nontarget opacification to the bladder base/rectum. The catheter was advanced further on the fathom wire, and with the more distal position, there was preferential filling of the anterolateral division of the prostate artery. Before embolization, we gently infused 2.5 mg verapamil intra arterial through the microcatheter. We then performed embolization with 300-500 micron embosphere, with a total 15 cc solution, a 30% dilution (5 cc of embosphere and 10 cc of contrast). Approximately 30% of the vial was used for embolization of the left prostate half. Once stasis was achieved, microcatheter was withdrawn and we interrogated 1 additional branch of interest. This was not embolized as there appear to be some contamination/cross-filling of rectum/bladder base. Microcatheter was removed. The prostate arterial catheter was withdrawn into the abdominal aorta and used to select the ipsilateral iliac artery. Catheter was advanced into the internal iliac artery with the use of the standard Glidewire. Once the catheter was in position angiogram was performed. Angiogram was performed first in the AP position, and then in the ipsilateral oblique, 35 degrees with 10 degrees of caudal-cranial angulation. We reviewed images, and elected to pursue selective angiogram of the most tortuous of the branch arteries arising from the proximal gluteal pudendal trunk. Microcatheter was advanced with a synchro wire into the artery of interest. Once we confirmed catheter position within the artery the wire was removed. Angiogram was performed in the ipsilateral oblique and AP projection. Flat panel CT was performed. 0.3 cc/second for a total of 10 cc of contrast, with 10 second delay before imaging. Intra arterial verapamil was  administered on the right, 2.5 mg. We then performed embolization with 300-500 micron embosphere. Of the 30% dilution, we used a proximally additional 30% of the vial. A total of 10 cc of EMBO solution was used for the entire prostate. One stasis was achieved all catheters and wires were removed. Celt was deployed for hemostasis. Patient tolerated the procedure well and remained hemodynamically stable throughout. No complications were encountered and no significant blood loss. FINDINGS: Ultrasound demonstrates patent right common femoral artery Angiogram of the pelvis demonstrates tortuosity of the bilateral iliac arteries of adequate caliber. On the left, there is standard type a internal iliac artery configuration, with posterior division as the superior gluteal artery in the anterior division as the gluteal-pudendal trunk. No obturator artery was identified as the prior CT shows a replaced operator from the external iliac artery. The prostate artery was identified as a type 1 artery, associated with the superior vesicle arteries. Embolization was performed to stasis. On the right, there is standard type a internal iliac artery configuration. Posterior division is the superior gluteal artery in the anterior division as a gluteal pudendal trunk. The prostate artery on the right appears to be a type 2 configuration, arising from the gluteal-pudendal trunk separate from the superior vesicle arteries. Embolization was performed to stasis. IMPRESSION: Status post ultrasound guided access right common femoral artery for pelvic angiogram, bilateral super selective catheter placement into the prostate arteries, and embolization to stasis after confirmation with flat panel CT imaging. Celt for hemostasis Signed, Dulcy Fanny. Nadene Rubins, RPVI [facility] Inter[facility] Radiology Special[facility] Radiology Electronically Signed   By: Corrie Hamilton D.O.   On: 07/31/2022  15:48   IR 3D Independent Darreld Mclean  Result  Date: 07/31/2022 INDICATION: 82 year old male presents for prostate gland embolization EXAM: ULTRASOUND-GUIDED ACCESS RIGHT COMMON FEMORAL ARTERY PELVIC ANGIOGRAM SUPER SELECTIVE CATHETER PLACEMENT INTO THE LEFT SUPERIOR VESICLE ARTERY, LEFT PROSTATE ARTERY, AND RIGHT PROSTATE ARTERY EMBOLIZATION OF BILATERAL PROSTATE ARTERIES CELT FOR HEMOSTASIS MEDICATIONS: 5 mg verapamil intra arterial ANESTHESIA/SEDATION: Moderate (conscious) sedation was employed during this procedure. A total of Versed 2.0 mg and Fentanyl 100 mcg was administered intravenously by the radiology nurse. Total intra-service moderate Sedation Time: 134 minutes. The patient's level of consciousness and vital signs were monitored continuously by radiology nursing throughout the procedure under my direct supervision. CONTRAST:  150 CC FLUOROSCOPY: Radiation Exposure Index (as provided by the fluoroscopic device): 34 MINUTES, 48 SECONDS, 0000000 mGy Kerma COMPLICATIONS: None PROCEDURE: Informed consent was obtained from the patient following explanation of the procedure, risks, benefits and alternatives. The patient understands, agrees and consents for the procedure. All questions were addressed. A time out was performed prior to the initiation of the procedure. Maximal barrier sterile technique utilized including caps, mask, sterile gowns, sterile gloves, large sterile drape, hand hygiene, and Betadine prep. Ultrasound survey of the right inguinal region was performed with images stored and sent to PACs, confirming patency of the vessel. A micropuncture needle was used access the right common femoral artery under ultrasound. With excellent arterial blood flow returned, and an .018 micro wire was passed through the needle, observed enter the abdominal aorta under fluoroscopy. The needle was removed, and a micropuncture sheath was placed over the wire. The inner dilator and wire were removed, and an 035 Bentson wire was advanced under fluoroscopy into the  abdominal aorta. The sheath was removed and a standard 5 [place] vascular sheath was placed. The dilator was removed and the sheath was flushed. Omni Flush catheter was passed over the wire into the lower abdominal aorta. Catheter was used to perform pelvic angiogram and then used to select the left common iliac artery. Bentson wire was passed into the left external iliac artery. Given the tortuosity, we could not achieve a distal wire position in the Omni Flush catheter was prone to prolapse into the abdominal aorta. Bentson wire was removed and a stiff glide was placed. Tortuosity again was prohibitive in having a distal wire position with a stiff glide catheter, with the Omni Flush catheter prolapsing into the aorta. C2 catheter was then passed on the Glidewire with an attempt to select the left common iliac artery. The small caliber distal aorta and the tortuosity precluded selection of the common iliac artery with the C2 Cobra catheter. Exchange was then made again for the Omni Flush catheter. A standard Glidewire was passed through the Omni Flush catheter, achieving a distal position within the external iliac artery. The Omni Flush catheter was removed. A 4 French glide catheter was then advanced on the standard Glidewire to the distal external iliac artery. Glidewire was removed and the Bentson wire was placed into the external iliac artery. Glide cath was then removed from the Bentson wire and the 15 cm prostate arterial catheter was passed up in over the bifurcation to form the re-curve. Bentson wire was removed. Catheter was withdrawn to the origin of the internal iliac artery and then navigated into the internal iliac artery to the bifurcation. Angiogram was performed, 35 degrees ipsilateral oblique with 10 degrees caudal-cranial. 3 cc/second for a total of 15 cc of contrast. After evaluation of the arteries, we then performed some additional hand  injections of the bifurcation with different projections to  identified the artery of interest. Alpha Progreat microcatheter was then used to select the first artery of interest with 014 fathom wire. Once we elected the artery of interest, angiogram was performed in the ipsilateral oblique and the AP. Flat panel CT was then performed. 0.3 cc/second for a total of 10 cc of contrast, with 10 second delay before imaging. We reviewed the images, which showed that the first artery of selection and injection was in fact superior vesical artery. Microcatheter was withdrawn to the second artery of interest. Microcatheter was advanced with the fathom wire into the proximal target artery. Angiogram in the AP and ipsilateral oblique projection was performed. A flat panel spin CT was performed. 0.3 cc/second for a total of 10 cc of contrast, with 10 second delay before imaging. We reviewed these images, which showed [place] that the current artery of interest was in fact the left prostate artery. The initial catheter position demonstrated some nontarget opacification to the bladder base/rectum. The catheter was advanced further on the fathom wire, and with the more distal position, there was preferential filling of the anterolateral division of the prostate artery. Before embolization, we gently infused 2.5 mg verapamil intra arterial through the microcatheter. We then performed embolization with 300-500 micron embosphere, with a total 15 cc solution, a 30% dilution (5 cc of embosphere and 10 cc of contrast). Approximately 30% of the vial was used for embolization of the left prostate half. Once stasis was achieved, microcatheter was withdrawn and we interrogated 1 additional branch of interest. This was not embolized as there appear to be some contamination/cross-filling of rectum/bladder base. Microcatheter was removed. The prostate arterial catheter was withdrawn into the abdominal aorta and used to select the ipsilateral iliac artery. Catheter was advanced into the internal iliac artery  with the use of the standard Glidewire. Once the catheter was in position angiogram was performed. Angiogram was performed first in the AP position, and then in the ipsilateral oblique, 35 degrees with 10 degrees of caudal-cranial angulation. We reviewed images, and elected to pursue selective angiogram of the most tortuous of the branch arteries arising from the proximal gluteal pudendal trunk. Microcatheter was advanced with a synchro wire into the artery of interest. Once we confirmed catheter position within the artery the wire was removed. Angiogram was performed in the ipsilateral oblique and AP projection. Flat panel CT was performed. 0.3 cc/second for a total of 10 cc of contrast, with 10 second delay before imaging. Intra arterial verapamil was administered on the right, 2.5 mg. We then performed embolization with 300-500 micron embosphere. Of the 30% dilution, we used a proximally additional 30% of the vial. A total of 10 cc of EMBO solution was used for the entire prostate. One stasis was achieved all catheters and wires were removed. Celt was deployed for hemostasis. Patient tolerated the procedure well and remained hemodynamically stable throughout. No complications were encountered and no significant blood loss. FINDINGS: Ultrasound demonstrates patent right common femoral artery Angiogram of the pelvis demonstrates tortuosity of the bilateral iliac arteries of adequate caliber. On the left, there is standard type a internal iliac artery configuration, with posterior division as the superior gluteal artery in the anterior division as the gluteal-pudendal trunk. No obturator artery was identified as the prior CT shows a replaced operator from the external iliac artery. The prostate artery was identified as a type 1 artery, associated with the superior vesicle arteries. Embolization was performed to stasis.  On the right, there is standard type a internal iliac artery configuration. Posterior division is  the superior gluteal artery in the anterior division as a gluteal pudendal trunk. The prostate artery on the right appears to be a type 2 configuration, arising from the gluteal-pudendal trunk separate from the superior vesicle arteries. Embolization was performed to stasis. IMPRESSION: Status post ultrasound guided access right common femoral artery for pelvic angiogram, bilateral super selective catheter placement into the prostate arteries, and embolization to stasis after confirmation with flat panel CT imaging. Celt for hemostasis Signed, Dulcy Fanny. Nadene Rubins, RPVI Vascular and Interventional Radiology Specialists [facility] Radiology Electronically Signed   By: Corrie Hamilton D.O.   On: 07/31/2022 15:48   IR Angiogram Selective Each Additional Vessel  Result Date: 07/31/2022 INDICATION: 81 year old male presents for prostate gland embolization EXAM: ULTRASOUND-GUIDED ACCESS RIGHT COMMON FEMORAL ARTERY PELVIC ANGIOGRAM SUPER SELECTIVE CATHETER PLACEMENT INTO THE LEFT SUPERIOR VESICLE ARTERY, LEFT PROSTATE ARTERY, AND RIGHT PROSTATE ARTERY EMBOLIZATION OF BILATERAL PROSTATE ARTERIES CELT FOR HEMOSTASIS MEDICATIONS: 5 mg verapamil intra arterial ANESTHESIA/SEDATION: Moderate (conscious) sedation was employed during this procedure. A total of Versed 2.0 mg and Fentanyl 100 mcg was administered intravenously by the radiology nurse. Total intra-service moderate Sedation Time: 134 minutes. The patient's level of consciousness and vital signs were monitored continuously by radiology nursing throughout the procedure under my direct supervision. CONTRAST:  150 CC FLUOROSCOPY: Radiation Exposure Index (as provided by the fluoroscopic device): 34 MINUTES, 48 SECONDS, 0000000 mGy Kerma COMPLICATIONS: None PROCEDURE: Informed consent was obtained from the patient following explanation of the procedure, risks, benefits and alternatives. The patient understands, agrees and consents for the procedure. All questions were  addressed. A time out was performed prior to the initiation of the procedure. Maximal barrier sterile technique utilized including caps, mask, sterile gowns, sterile gloves, large sterile drape, hand hygiene, and Betadine prep. Ultrasound survey of the right inguinal region was performed with images stored and sent to PACs, confirming patency of the vessel. A micropuncture needle was used access the right common femoral artery under ultrasound. With excellent arterial blood flow returned, and an .018 micro wire was passed through the needle, observed enter the abdominal aorta under fluoroscopy. The needle was removed, and a micropuncture sheath was placed over the wire. The inner dilator and wire were removed, and an 035 Bentson wire was advanced under fluoroscopy into the abdominal aorta. The sheath was removed and a standard 5 [place] vascular sheath was placed. The dilator was removed and the sheath was flushed. Omni Flush catheter was passed over the wire into the lower abdominal aorta. Catheter was used to perform pelvic angiogram and then used to select the left common iliac artery. Bentson wire was passed into the left external iliac artery. Given the tortuosity, we could not achieve a distal wire position in the Omni Flush catheter was prone to prolapse into the abdominal aorta. Bentson wire was removed and a stiff glide was placed. Tortuosity again was prohibitive in having a distal wire position with a stiff glide catheter, with the Omni Flush catheter prolapsing into the aorta. C2 catheter was then passed on the Glidewire with an attempt to select the left common iliac artery. The small caliber distal aorta and the tortuosity precluded selection of the common iliac artery with the C2 Cobra catheter. Exchange was then made again for the Omni Flush catheter. A standard Glidewire was passed through the Omni Flush catheter, achieving a distal position within the external iliac artery. The  Omni Flush catheter  was removed. A 4 French glide catheter was then advanced on the standard Glidewire to the distal external iliac artery. Glidewire was removed and the Bentson wire was placed into the external iliac artery. Glide cath was then removed from the Bentson wire and the 15 cm prostate arterial catheter was passed up in over the bifurcation to form the re-curve. Bentson wire was removed. Catheter was withdrawn to the origin of the internal iliac artery and then navigated into the internal iliac artery to the bifurcation. Angiogram was performed, 35 degrees ipsilateral oblique with 10 degrees caudal-cranial. 3 cc/second for a total of 15 cc of contrast. After evaluation of the arteries, we then performed some additional hand injections of the bifurcation with different projections to identified the artery of interest. Alpha Progreat microcatheter was then used to select the first artery of interest with 014 fathom wire. Once we elected the artery of interest, angiogram was performed in the ipsilateral oblique and the AP. Flat panel CT was then performed. 0.3 cc/second for a total of 10 cc of contrast, with 10 second delay before imaging. We reviewed the images, which showed that the first artery of selection and injection was in fact superior vesical artery. Microcatheter was withdrawn to the second artery of interest. Microcatheter was advanced with the fathom wire into the proximal target artery. Angiogram in the AP and ipsilateral oblique projection was performed. A flat panel spin CT was performed. 0.3 cc/second for a total of 10 cc of contrast, with 10 second delay before imaging. We reviewed these images, which showed [place] that the current artery of interest was in fact the left prostate artery. The initial catheter position demonstrated some nontarget opacification to the bladder base/rectum. The catheter was advanced further on the fathom wire, and with the more distal position, there was preferential filling of the  anterolateral division of the prostate artery. Before embolization, we gently infused 2.5 mg verapamil intra arterial through the microcatheter. We then performed embolization with 300-500 micron embosphere, with a total 15 cc solution, a 30% dilution (5 cc of embosphere and 10 cc of contrast). Approximately 30% of the vial was used for embolization of the left prostate half. Once stasis was achieved, microcatheter was withdrawn and we interrogated 1 additional branch of interest. This was not embolized as there appear to be some contamination/cross-filling of rectum/bladder base. Microcatheter was removed. The prostate arterial catheter was withdrawn into the abdominal aorta and used to select the ipsilateral iliac artery. Catheter was advanced into the internal iliac artery with the use of the standard Glidewire. Once the catheter was in position angiogram was performed. Angiogram was performed first in the AP position, and then in the ipsilateral oblique, 35 degrees with 10 degrees of caudal-cranial angulation. We reviewed images, and elected to pursue selective angiogram of the most tortuous of the branch arteries arising from the proximal gluteal pudendal trunk. Microcatheter was advanced with a synchro wire into the artery of interest. Once we confirmed catheter position within the artery the wire was removed. Angiogram was performed in the ipsilateral oblique and AP projection. Flat panel CT was performed. 0.3 cc/second for a total of 10 cc of contrast, with 10 second delay before imaging. Intra arterial verapamil was administered on the right, 2.5 mg. We then performed embolization with 300-500 micron embosphere. Of the 30% dilution, we used a proximally additional 30% of the vial. A total of 10 cc of EMBO solution was used for the entire prostate. One  stasis was achieved all catheters and wires were removed. Celt was deployed for hemostasis. Patient tolerated the procedure well and remained hemodynamically  stable throughout. No complications were encountered and no significant blood loss. FINDINGS: Ultrasound demonstrates patent right common femoral artery Angiogram of the pelvis demonstrates tortuosity of the bilateral iliac arteries of adequate caliber. On the left, there is standard type a internal iliac artery configuration, with posterior division as the superior gluteal artery in the anterior division as the gluteal-pudendal trunk. No obturator artery was identified as the prior CT shows a replaced operator from the external iliac artery. The prostate artery was identified as a type 1 artery, associated with the superior vesicle arteries. Embolization was performed to stasis. On the right, there is standard type a internal iliac artery configuration. Posterior division is the superior gluteal artery in the anterior division as a gluteal pudendal trunk. The prostate artery on the right appears to be a type 2 configuration, arising from the gluteal-pudendal trunk separate from the superior vesicle arteries. Embolization was performed to stasis. IMPRESSION: Status post ultrasound guided access right common femoral artery for pelvic angiogram, bilateral super selective catheter placement into the prostate arteries, and embolization to stasis after confirmation with flat panel CT imaging. Celt for hemostasis Signed, Dulcy Fanny. Nadene Rubins, RPVI Vascular and Interventional Radiology Specialists [facility] Radiology Electronically Signed   By: Corrie Hamilton D.O.   On: 07/31/2022 15:48   IR Angiogram Selective Each Additional Vessel  Result Date: 07/31/2022 INDICATION: 82 year old male presents for prostate gland embolization EXAM: ULTRASOUND-GUIDED ACCESS RIGHT COMMON FEMORAL ARTERY PELVIC ANGIOGRAM SUPER SELECTIVE CATHETER PLACEMENT INTO THE LEFT SUPERIOR VESICLE ARTERY, LEFT PROSTATE ARTERY, AND RIGHT PROSTATE ARTERY EMBOLIZATION OF BILATERAL PROSTATE ARTERIES CELT FOR HEMOSTASIS MEDICATIONS: 5 mg verapamil  intra arterial ANESTHESIA/SEDATION: Moderate (conscious) sedation was employed during this procedure. A total of Versed 2.0 mg and Fentanyl 100 mcg was administered intravenously by the radiology nurse. Total intra-service moderate Sedation Time: 134 minutes. The patient's level of consciousness and vital signs were monitored continuously by radiology nursing throughout the procedure under my direct supervision. CONTRAST:  150 CC FLUOROSCOPY: Radiation Exposure Index (as provided by the fluoroscopic device): 34 MINUTES, 48 SECONDS, 0000000 mGy Kerma COMPLICATIONS: None PROCEDURE: Informed consent was obtained from the patient following explanation of the procedure, risks, benefits and alternatives. The patient understands, agrees and consents for the procedure. All questions were addressed. A time out was performed prior to the initiation of the procedure. Maximal barrier sterile technique utilized including caps, mask, sterile gowns, sterile gloves, large sterile drape, hand hygiene, and Betadine prep. Ultrasound survey of the right inguinal region was performed with images stored and sent to PACs, confirming patency of the vessel. A micropuncture needle was used access the right common femoral artery under ultrasound. With excellent arterial blood flow returned, and an .018 micro wire was passed through the needle, observed enter the abdominal aorta under fluoroscopy. The needle was removed, and a micropuncture sheath was placed over the wire. The inner dilator and wire were removed, and an 035 Bentson wire was advanced under fluoroscopy into the abdominal aorta. The sheath was removed and a standard 5 [place] vascular sheath was placed. The dilator was removed and the sheath was flushed. Omni Flush catheter was passed over the wire into the lower abdominal aorta. Catheter was used to perform pelvic angiogram and then used to select the left common iliac artery. Bentson wire was passed into the left external iliac  artery. Given the tortuosity,  we could not achieve a distal wire position in the Omni Flush catheter was prone to prolapse into the abdominal aorta. Bentson wire was removed and a stiff glide was placed. Tortuosity again was prohibitive in having a distal wire position with a stiff glide catheter, with the Omni Flush catheter prolapsing into the aorta. C2 catheter was then passed on the Glidewire with an attempt to select the left common iliac artery. The small caliber distal aorta and the tortuosity precluded selection of the common iliac artery with the C2 Cobra catheter. Exchange was then made again for the Omni Flush catheter. A standard Glidewire was passed through the Omni Flush catheter, achieving a distal position within the external iliac artery. The Omni Flush catheter was removed. A 4 French glide catheter was then advanced on the standard Glidewire to the distal external iliac artery. Glidewire was removed and the Bentson wire was placed into the external iliac artery. Glide cath was then removed from the Bentson wire and the 15 cm prostate arterial catheter was passed up in over the bifurcation to form the re-curve. Bentson wire was removed. Catheter was withdrawn to the origin of the internal iliac artery and then navigated into the internal iliac artery to the bifurcation. Angiogram was performed, 35 degrees ipsilateral oblique with 10 degrees caudal-cranial. 3 cc/second for a total of 15 cc of contrast. After evaluation of the arteries, we then performed some additional hand injections of the bifurcation with different projections to identified the artery of interest. Alpha Progreat microcatheter was then used to select the first artery of interest with 014 fathom wire. Once we elected the artery of interest, angiogram was performed in the ipsilateral oblique and the AP. Flat panel CT was then performed. 0.3 cc/second for a total of 10 cc of contrast, with 10 second delay before imaging. We reviewed  the images, which showed that the first artery of selection and injection was in fact superior vesical artery. Microcatheter was withdrawn to the second artery of interest. Microcatheter was advanced with the fathom wire into the proximal target artery. Angiogram in the AP and ipsilateral oblique projection was performed. A flat panel spin CT was performed. 0.3 cc/second for a total of 10 cc of contrast, with 10 second delay before imaging. We reviewed these images, which showed [place] that the current artery of interest was in fact the left prostate artery. The initial catheter position demonstrated some nontarget opacification to the bladder base/rectum. The catheter was advanced further on the fathom wire, and with the more distal position, there was preferential filling of the anterolateral division of the prostate artery. Before embolization, we gently infused 2.5 mg verapamil intra arterial through the microcatheter. We then performed embolization with 300-500 micron embosphere, with a total 15 cc solution, a 30% dilution (5 cc of embosphere and 10 cc of contrast). Approximately 30% of the vial was used for embolization of the left prostate half. Once stasis was achieved, microcatheter was withdrawn and we interrogated 1 additional branch of interest. This was not embolized as there appear to be some contamination/cross-filling of rectum/bladder base. Microcatheter was removed. The prostate arterial catheter was withdrawn into the abdominal aorta and used to select the ipsilateral iliac artery. Catheter was advanced into the internal iliac artery with the use of the standard Glidewire. Once the catheter was in position angiogram was performed. Angiogram was performed first in the AP position, and then in the ipsilateral oblique, 35 degrees with 10 degrees of caudal-cranial angulation. We reviewed images,  and elected to pursue selective angiogram of the most tortuous of the branch arteries arising from the proximal  gluteal pudendal trunk. Microcatheter was advanced with a synchro wire into the artery of interest. Once we confirmed catheter position within the artery the wire was removed. Angiogram was performed in the ipsilateral oblique and AP projection. Flat panel CT was performed. 0.3 cc/second for a total of 10 cc of contrast, with 10 second delay before imaging. Intra arterial verapamil was administered on the right, 2.5 mg. We then performed embolization with 300-500 micron embosphere. Of the 30% dilution, we used a proximally additional 30% of the vial. A total of 10 cc of EMBO solution was used for the entire prostate. One stasis was achieved all catheters and wires were removed. Celt was deployed for hemostasis. Patient tolerated the procedure well and remained hemodynamically stable throughout. No complications were encountered and no significant blood loss. FINDINGS: Ultrasound demonstrates patent right common femoral artery Angiogram of the pelvis demonstrates tortuosity of the bilateral iliac arteries of adequate caliber. On the left, there is standard type a internal iliac artery configuration, with posterior division as the superior gluteal artery in the anterior division as the gluteal-pudendal trunk. No obturator artery was identified as the prior CT shows a replaced operator from the external iliac artery. The prostate artery was identified as a type 1 artery, associated with the superior vesicle arteries. Embolization was performed to stasis. On the right, there is standard type a internal iliac artery configuration. Posterior division is the superior gluteal artery in the anterior division as a gluteal pudendal trunk. The prostate artery on the right appears to be a type 2 configuration, arising from the gluteal-pudendal trunk separate from the superior vesicle arteries. Embolization was performed to stasis. IMPRESSION: Status post ultrasound guided access right common femoral artery for pelvic angiogram,  bilateral super selective catheter placement into the prostate arteries, and embolization to stasis after confirmation with flat panel CT imaging. Celt for hemostasis Signed, Dulcy Fanny. Nadene Rubins, RPVI [facility] Radiology Electronically Signed   By: Corrie Hamilton D.O.   On: 07/31/2022 15:48   CUP PACEART INCLINIC DEVICE CHECK  Result Date: 07/16/2022 Pacemaker check in clinic. Normal device function. Thresholds, sensing, impedances consistent with previous measurements. Device programmed to maximize longevity. No mode switch or high ventricular rates noted. Device programmed at appropriate safety margins. Histogram distribution appropriate for patient activity level. Device programmed to optimize intrinsic conduction. Estimated longevity 8.75 years. Patient enrolled in remote follow-up. Patient education completed.  CUP PACEART REMOTE DEVICE CHECK  Result Date: 07/11/2022 Scheduled remote reviewed. Normal device function.  Next remote 91 days. [place]   Labs:  CBC: Recent Labs    05/01/22 1838 07/01/22 2257 07/03/22 1701 07/31/22 0800  WBC 8.0 4.5 6.4 5.6  HGB 16.1 16.4 15.8 16.2  HCT 49.0 49.2 48.4 49.0  PLT 168 122* 144* 135*    COAGS: Recent Labs    09/04/21 1324 04/13/22 1010 07/31/22 0800  INR 1.2 1.1 1.0    BMP: Recent Labs    05/01/22 1838 07/01/22 2257 07/03/22 1701 07/31/22 0800  NA 143 140 141 138  K 3.7 3.7 3.8 4.1  CL 109 107 107 107  CO2 '26 26 25 28  '$ GLUCOSE 121* 104* 107* 106*  BUN '21 15 18 14  '$ CALCIUM 9.8 9.8 9.6 9.5  CREATININE 1.02 0.87 1.15 1.04  GFRNONAA >60 >60 >60 >60    LIVER FUNCTION TESTS: Recent Labs  09/27/21 2120 04/13/22 1010 05/01/22 1838 07/31/22 0800  BILITOT 0.6 0.4 0.5 0.6  AST '23 16 18 20  '$ ALT '14 14 18 14  '$ ALKPHOS 61 58 61 73  PROT 7.3 7.3 7.6 7.8  ALBUMIN 3.8 4.0 3.9 4.1    TUMOR MARKERS: No results for input(s): "AFPTM", "CEA", "CA199", "CHROMGRNA" in the last  8760 hours.  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 POD 8.    As of yesterday, he started having symptoms of urethritis, with no other signs or symptoms of UTI.  We initiated pyridium '100mg'$  yesterday up to TID.  I encouraged him and his wife to make sure he is getting adequate oral hydration, and to let us know if he develops any new symptoms that would be concerning for UTI.    They have my cell phone number now, and we will follow up with them early next week.  If he is having ongoing symptoms we can either extend the pyridium or they can pick up some OTC "Azo" at the drugstore.    Plan: - Continue pyridium '100mg'$  TID for symptoms of urethritis.   - We will have the office give them a ring early next week to check his progress.  If ongoing symptoms, he can pick up some over-the-counter "Azo" at the drugstore, or we can write for more pyridium. Continue adequate oral hydration   Electronically Signed: Corrie Hamilton 08/08/2022, 12:31 PM   I spent a total of    25 Minutes in remote  clinical consultation, greater than 50% of which was counseling/coordinating care for BPH with LUTS, SP prostate artery embolization, post-embo symptoms of urethritis.    Visit type: Audio only (telephone). Audio (no video) only due to patient's lack of internet/smartphone capability. Alternative for in-person consultation at [facility], [address], [place], [place]. This visit type was conducted due to national recommendations for restrictions regarding the COVID-19 Pandemic (e.g. social distancing).  This format is felt to be most appropriate for this patient at this time.  All issues noted in this document were discussed and addressed.

## 2022-08-21 ENCOUNTER — Ambulatory Visit
Admission: RE | Admit: 2022-08-21 | Discharge: 2022-08-21 | Disposition: A | Discharge status: Home / Self Care | Payer: Medicare PPO | Source: Ambulatory Visit | Attending: Interventional Radiology | Admitting: Interventional Radiology

## 2022-08-21 DIAGNOSIS — N401 Enlarged prostate with lower urinary tract symptoms: Secondary | ICD-10-CM | POA: Diagnosis not present

## 2022-08-21 DIAGNOSIS — R972 Elevated prostate specific antigen [PSA]: Secondary | ICD-10-CM | POA: Diagnosis not present

## 2022-08-21 DIAGNOSIS — N400 Enlarged prostate without lower urinary tract symptoms: Secondary | ICD-10-CM

## 2022-08-21 DIAGNOSIS — R310 Gross hematuria: Secondary | ICD-10-CM | POA: Diagnosis not present

## 2022-08-21 HISTORY — PX: IR RADIOLOGIST EVAL & MGMT: IMG5224

## 2022-08-21 NOTE — Progress Notes (Signed)
Chief Complaint: LUTS secondary to BPH, SP prostate artery embolization   Referring Physician(s): Dr. Lovena Neighbours   [facility] Theda Sers    History of Present Illness: Dustin Hamilton is an 82 y.o. male presenting to [facility] today for an interval follow up, SP prostate artery embolization 07/31/22 for treatment of BPH/LUTS.     Dustin Hamilton and his wife join [place] today by telephone.   We confirmed his identity with 2 personal identifiers.    History: We treated Dustin Hamilton Thursday 07/31/22 at [facility], with right CFA access and bilateral prostate artery embolization.  He was discharged on the same day to home.     Before he was treated, we had a balloon retention urinary catheter placed, and a plan in place to have a voiding trial in a week or so.  Over the first night after treatment, 2/29, the catheter broke, and he was in touch with my partner Dr. Serafina Royals regarding the displaced urinary catheter.  We confirmed that all of the components were removed and discarded.  He was voiding normally the day after, so we did not have him have the catheter replaced.    08/07/22, they contacted our clinic, with complaint of "burning" with urination.  We initiated a pyridium Rx for him    Interval history:  Today, they tell me that all of his post-embolization symptoms have resolved.  As of this past Monday, the burning with urination has resolved.  He says he is "feeling very well."  He denies any new hematuria. He is stating that he has no new symptoms of urgency or nocturia.  I cannot elicit any history of double-voiding need.  He denies any symptoms of a new UTI.    They have an appointment today with the Urology office.     Past Medical History:  Diagnosis Date   BPH (benign prostatic hyperplasia)    Cerebrovascular disease    Clostridium difficile diarrhea    Congenital anomaly of diaphragm    Elevated PSA    Glaucoma, both eyes    Hemorrhoid    Hepatitis B surface antigen  positive    02-20-2011   History of adenomatous polyp of colon    2007, 2009 and 2013  tubular adenoma's   History of alcohol abuse    quit 1963   History of cerebral parenchymal hemorrhage    01/ 2006  left occiptial lobe related to hypertensive crisis   History of CVA (cerebrovascular accident)    09-12-2012  left hippocampus/ amygdala junction and per MRI old white matter infarcts--  per pt residual short- term memory issues   History of fatty infiltration of liver hx visit's at [facility] , last visit 05/ 2014   elvated LFT's ,  via liver bx 2004 related to hx alcohol and drug abuse (quit 1964)   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   HTN (hypertension)    Renal cyst, left    Stroke ([place])    hx of 3 strokes in past    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    Urethral lesion    urethral mass    Past Surgical History:  Procedure Laterality Date   CARDIOVASCULAR STRESS TEST  05/05/2007   normal nuclear study w/ no ischemia/  normal LV fucntion and wall motion , ef60%   COLONOSCOPY  last one 04-06-2012   CYSTO/  LEFT RETROGRADE  PYELOGRAM/ CYTOLOGY WASHINGS/  URETEROSCOPY  03/05/2000   INGUINAL HERNIA REPAIR Bilateral 1965 and 1980's   IR 3D INDEPENDENT WKST  07/31/2022   IR ANGIOGRAM PELVIS SELECTIVE OR SUPRASELECTIVE  07/31/2022   IR ANGIOGRAM SELECTIVE EACH ADDITIONAL VESSEL  07/31/2022   IR ANGIOGRAM SELECTIVE EACH ADDITIONAL VESSEL  07/31/2022   IR ANGIOGRAM SELECTIVE EACH ADDITIONAL VESSEL  07/31/2022   IR ANGIOGRAM SELECTIVE EACH ADDITIONAL VESSEL  07/31/2022   IR EMBO TUMOR ORGAN ISCHEMIA INFARCT INC GUIDE ROADMAPPING  07/31/2022   IR RADIOLOGIST EVAL & MGMT  06/26/2022   IR RADIOLOGIST EVAL & MGMT  08/08/2022   IR US GUIDE VASC ACCESS RIGHT  07/31/2022   LAPAROSCOPIC INGUINAL HERNIA WITH UMBILICAL HERNIA Right [id number]   LIVER BIOPSY  1980's and 2004   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   Procedure: PACEMAKER  IMPLANT;  Surgeon: Evans Lance, MD;  Location: [place] [facility];  Service: Cardiovascular;  Laterality: N/A;   SVT ABLATION N/A 11/15/2018   Procedure: SVT ABLATION;  Surgeon: Evans Lance, MD;  Location: [place] [facility];  Service: Cardiovascular;  Laterality: N/A;   TRANSTHORACIC ECHOCARDIOGRAM  09/13/2012   moderate LVH,  ef 60-65%/     TRANSURETHRAL RESECTION OF BLADDER TUMOR N/A 08/11/2016   Procedure: TRANSURETHRAL RESECTION OF BLADDER TUMOR (TURBT);  Surgeon: Cleon Gustin, MD;  Location: [facility];  Service: Urology;  Laterality: N/A;    Allergies: Aricept [donepezil], Levetiracetam, Penicillins, and Viagra [sildenafil]  Medications: Prior to Admission medications   Medication Sig Start Date End Date Taking? Authorizing Provider  acetaminophen (TYLENOL) 325 MG tablet Take 2 tablets (650 mg total) by mouth every 6 (six) hours as needed for moderate pain. 02/03/22   Jaynee Eagles, PA-C  allopurinol (ZYLOPRIM) 100 MG tablet Take 1 tablet (100 mg total) by mouth daily. 04/05/21 07/31/22  Pokhrel, Corrie Mckusick, MD  amLODipine (NORVASC) 2.5 MG tablet Take 2.5 mg by mouth daily.    [provider]  brimonidine (ALPHAGAN) 0.2 % ophthalmic solution Place 1 drop into both eyes 2 (two) times daily.    [provider]  diclofenac Sodium (VOLTAREN) 1 % GEL Apply 4 g topically 4 (four) times daily. Patient taking differently: Apply 4 g topically daily as needed (to affected sites- for pain). 06/05/21   Deno Etienne, DO  docusate sodium (COLACE) 100 MG capsule Take 1 capsule (100 mg total) by mouth 2 (two) times daily. 07/31/22   Lura Em, PA  dutasteride (AVODART) 0.5 MG capsule Take 0.5 mg by mouth daily.    [provider]  ibuprofen (ADVIL) 800 MG tablet Take 1 tablet (800 mg total) by mouth every 8 (eight) hours as needed. 07/31/22   Lura Em, PA  latanoprost (XALATAN) 0.005 % ophthalmic solution Place 1 drop into both eyes at bedtime.      [provider]  lidocaine (LIDODERM) 5 % Place 1 patch onto the skin daily as needed (back pain). Remove & Discard patch within 12 hours or as directed by MD    [provider]  memantine (NAMENDA) 10 MG tablet Take 20 mg by mouth in the morning and at bedtime.    [provider]  metoprolol succinate (TOPROL-XL) 25 MG 24 hr tablet Take 0.5 tablets (12.5 mg total) by mouth at bedtime. 05/31/20   Lilland, Alana, DO  Naphazoline HCl (CLEAR EYES OP) Place 2 drops into both eyes daily as needed (dry eyes).    [provider]  Nutritional Supplements (  ENSURE ACTIVE HEART HEALTH PO) Take 237 mLs by mouth daily as needed (appetite, nutrition). Clear/apple    [provider]  polyethylene glycol (MIRALAX / GLYCOLAX) 17 g packet Take 17 g by mouth daily. Patient taking differently: Take 17 g by mouth daily as needed for mild constipation. 05/01/22   Drenda Freeze, MD  tamsulosin (FLOMAX) 0.4 MG CAPS capsule Take 0.4 mg by mouth at bedtime.    [provider]  tiZANidine (ZANAFLEX) 4 MG tablet Take 4 mg by mouth 2 (two) times daily as needed for muscle spasms. 06/11/21   [provider]     Family History  Problem Relation Age of Onset   Rheum arthritis Mother    Diabetes Mother    Stroke Mother    Heart attack Mother    Kidney failure Mother    Heart attack Father    Heart disease Maternal Grandmother    Rheum arthritis Maternal Grandmother    Diabetes Maternal Grandmother    Stroke Maternal Grandmother    Colon cancer Neg Hx     Social History   Socioeconomic History   Marital status: Married    Spouse name: [name]   Number of children: 1   Years of education: College   Highest education level: Not on file  Occupational History   Occupation: Geophysicist/field seismologist  Tobacco Use   Smoking status: Former    Packs/day: 1.00    Years: 5.00    Additional pack years: 0.00    Total pack years: 5.00    Types: Cigarettes    Quit date:  11/30/1981    Years since quitting: 40.7   Smokeless tobacco: Never  Vaping Use   Vaping Use: Never used  Substance and Sexual Activity   Alcohol use: No    Alcohol/week: 0.0 standard drinks of alcohol    Comment: hx abuse -- quit:  1963   Drug use: No    Comment: 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   Caffeine Use:  Tea, lots   Social Determinants of Health   Financial Resource Strain: Not on file  Food Insecurity: Not on file  Transportation Needs: Not on file  Physical Activity: Not on file  Stress: Not on file  Social Connections: Not on file       Review of Systems  Review of Systems: A 12 point ROS discussed and pertinent positives are indicated in the HPI above.  All other systems are negative.   Physical Exam No direct physical exam was performed (except for noted visual exam findings with Video Visits).   Vital Signs: There were no vitals taken for this visit.  Imaging: IR Radiologist Eval & Mgmt  Result Date: 08/08/2022 EXAM: NEW PATIENT OFFICE VISIT CHIEF COMPLAINT: Electronic medical record HISTORY OF PRESENT ILLNESS: Electronic medical record REVIEW OF SYSTEMS: Electronic medical record PHYSICAL EXAMINATION: Electronic medical record ASSESSMENT AND PLAN: Electronic medical record Electronically Signed   By: Corrie Mckusick D.O.   On: 08/08/2022 16:15   DG Chest Portable 1 View  Result Date: 08/02/2022 CLINICAL DATA:  Cough. EXAM: PORTABLE CHEST 1 VIEW COMPARISON:  Chest radiograph dated 07/02/2022. FINDINGS: There is mild eventration of the right hemidiaphragm. No focal consolidation, pleural effusion or pneumothorax. Left pectoral pacemaker device. Stable cardiac silhouette. No acute osseous pathology. IMPRESSION: No active  disease. Electronically Signed   By: Anner Crete M.D.   On: 08/02/2022 17:25   IR EMBO TUMOR  ORGAN ISCHEMIA INFARCT INC GUIDE ROADMAPPING  Result Date: 07/31/2022 INDICATION: 82 year old male presents for prostate gland embolization EXAM: ULTRASOUND-GUIDED ACCESS RIGHT COMMON FEMORAL ARTERY PELVIC ANGIOGRAM SUPER SELECTIVE CATHETER PLACEMENT INTO THE LEFT SUPERIOR VESICLE ARTERY, LEFT PROSTATE ARTERY, AND RIGHT PROSTATE ARTERY EMBOLIZATION OF BILATERAL PROSTATE ARTERIES CELT FOR HEMOSTASIS MEDICATIONS: 5 mg verapamil intra arterial ANESTHESIA/SEDATION: Moderate (conscious) sedation was employed during this procedure. A total of Versed 2.0 mg and Fentanyl 100 mcg was administered intravenously by the radiology nurse. Total intra-service moderate Sedation Time: 134 minutes. The patient's level of consciousness and vital signs were monitored continuously by radiology nursing throughout the procedure under my direct supervision. CONTRAST:  150 CC FLUOROSCOPY: Radiation Exposure Index (as provided by the fluoroscopic device): 34 MINUTES, 48 SECONDS, 0000000 mGy Kerma COMPLICATIONS: None PROCEDURE: Informed consent was obtained from the patient following explanation of the procedure, risks, benefits and alternatives. The patient understands, agrees and consents for the procedure. All questions were addressed. A time out was performed prior to the initiation of the procedure. Maximal barrier sterile technique utilized including caps, mask, sterile gowns, sterile gloves, large sterile drape, hand hygiene, and Betadine prep. Ultrasound survey of the right inguinal region was performed with images stored and sent to PACs, confirming patency of the vessel. A micropuncture needle was used access the right common femoral artery under ultrasound. With excellent arterial blood flow returned, and an .018 micro wire was passed through the needle, observed enter the abdominal aorta under fluoroscopy. The needle was removed, and a micropuncture sheath was placed over the wire. The inner dilator and wire were removed, and an  035 Bentson wire was advanced under fluoroscopy into the abdominal aorta. The sheath was removed and a standard 5 [place] vascular sheath was placed. The dilator was removed and the sheath was flushed. Omni Flush catheter was passed over the wire into the lower abdominal aorta. Catheter was used to perform pelvic angiogram and then used to select the left common iliac artery. Bentson wire was passed into the left external iliac artery. Given the tortuosity, we could not achieve a distal wire position in the Omni Flush catheter was prone to prolapse into the abdominal aorta. Bentson wire was removed and a stiff glide was placed. Tortuosity again was prohibitive in having a distal wire position with a stiff glide catheter, with the Omni Flush catheter prolapsing into the aorta. C2 catheter was then passed on the Glidewire with an attempt to select the left common iliac artery. The small caliber distal aorta and the tortuosity precluded selection of the common iliac artery with the C2 Cobra catheter. Exchange was then made again for the Omni Flush catheter. A standard Glidewire was passed through the Omni Flush catheter, achieving a distal position within the external iliac artery. The Omni Flush catheter was removed. A 4 French glide catheter was then advanced on the standard Glidewire to the distal external iliac artery. Glidewire was removed and the Bentson wire was placed into the external iliac artery. Glide cath was then removed from the Bentson wire and the 15 cm prostate arterial catheter was passed up in over the bifurcation to form the re-curve. Bentson wire was removed. Catheter was withdrawn to the origin of the internal iliac artery and then navigated into the internal iliac artery to the bifurcation. Angiogram was performed, 35 degrees ipsilateral oblique with 10 degrees caudal-cranial. 3 cc/second  for a total of 15 cc of contrast. After evaluation of the arteries, we then performed some additional hand  injections of the bifurcation with different projections to identified the artery of interest. Alpha Progreat microcatheter was then used to select the first artery of interest with 014 fathom wire. Once we elected the artery of interest, angiogram was performed in the ipsilateral oblique and the AP. Flat panel CT was then performed. 0.3 cc/second for a total of 10 cc of contrast, with 10 second delay before imaging. We reviewed the images, which showed that the first artery of selection and injection was in fact superior vesical artery. Microcatheter was withdrawn to the second artery of interest. Microcatheter was advanced with the fathom wire into the proximal target artery. Angiogram in the AP and ipsilateral oblique projection was performed. A flat panel spin CT was performed. 0.3 cc/second for a total of 10 cc of contrast, with 10 second delay before imaging. We reviewed these images, which showed [place] that the current artery of interest was in fact the left prostate artery. The initial catheter position demonstrated some nontarget opacification to the bladder base/rectum. The catheter was advanced further on the fathom wire, and with the more distal position, there was preferential filling of the anterolateral division of the prostate artery. Before embolization, we gently infused 2.5 mg verapamil intra arterial through the microcatheter. We then performed embolization with 300-500 micron embosphere, with a total 15 cc solution, a 30% dilution (5 cc of embosphere and 10 cc of contrast). Approximately 30% of the vial was used for embolization of the left prostate half. Once stasis was achieved, microcatheter was withdrawn and we interrogated 1 additional branch of interest. This was not embolized as there appear to be some contamination/cross-filling of rectum/bladder base. Microcatheter was removed. The prostate arterial catheter was withdrawn into the abdominal aorta and used to select the ipsilateral iliac  artery. Catheter was advanced into the internal iliac artery with the use of the standard Glidewire. Once the catheter was in position angiogram was performed. Angiogram was performed first in the AP position, and then in the ipsilateral oblique, 35 degrees with 10 degrees of caudal-cranial angulation. We reviewed images, and elected to pursue selective angiogram of the most tortuous of the branch arteries arising from the proximal gluteal pudendal trunk. Microcatheter was advanced with a synchro wire into the artery of interest. Once we confirmed catheter position within the artery the wire was removed. Angiogram was performed in the ipsilateral oblique and AP projection. Flat panel CT was performed. 0.3 cc/second for a total of 10 cc of contrast, with 10 second delay before imaging. Intra arterial verapamil was administered on the right, 2.5 mg. We then performed embolization with 300-500 micron embosphere. Of the 30% dilution, we used a proximally additional 30% of the vial. A total of 10 cc of EMBO solution was used for the entire prostate. One stasis was achieved all catheters and wires were removed. Celt was deployed for hemostasis. Patient tolerated the procedure well and remained hemodynamically stable throughout. No complications were encountered and no significant blood loss. FINDINGS: Ultrasound demonstrates patent right common femoral artery Angiogram of the pelvis demonstrates tortuosity of the bilateral iliac arteries of adequate caliber. On the left, there is standard type a internal iliac artery configuration, with posterior division as the superior gluteal artery in the anterior division as the gluteal-pudendal trunk. No obturator artery was identified as the prior CT shows a replaced operator from the external iliac artery. The prostate  artery was identified as a type 1 artery, associated with the superior vesicle arteries. Embolization was performed to stasis. On the right, there is standard type  a internal iliac artery configuration. Posterior division is the superior gluteal artery in the anterior division as a gluteal pudendal trunk. The prostate artery on the right appears to be a type 2 configuration, arising from the gluteal-pudendal trunk separate from the superior vesicle arteries. Embolization was performed to stasis. IMPRESSION: Status post ultrasound guided access right common femoral artery for pelvic angiogram, bilateral super selective catheter placement into the prostate arteries, and embolization to stasis after confirmation with flat panel CT imaging. Celt for hemostasis Signed, Dulcy Fanny. Nadene Rubins, RPVI [facility] Electronically Signed   By: Corrie Mckusick D.O.   On: 07/31/2022 15:48   IR US Guide Vasc Access Right  Result Date: 07/31/2022 INDICATION: 82 year old male presents for prostate gland embolization EXAM: ULTRASOUND-GUIDED ACCESS RIGHT COMMON FEMORAL ARTERY PELVIC ANGIOGRAM SUPER SELECTIVE CATHETER PLACEMENT INTO THE LEFT SUPERIOR VESICLE ARTERY, LEFT PROSTATE ARTERY, AND RIGHT PROSTATE ARTERY EMBOLIZATION OF BILATERAL PROSTATE ARTERIES CELT FOR HEMOSTASIS MEDICATIONS: 5 mg verapamil intra arterial ANESTHESIA/SEDATION: Moderate (conscious) sedation was employed during this procedure. A total of Versed 2.0 mg and Fentanyl 100 mcg was administered intravenously by the radiology nurse. Total intra-service moderate Sedation Time: 134 minutes. The patient's level of consciousness and vital signs were monitored continuously by radiology nursing throughout the procedure under my direct supervision. CONTRAST:  150 CC FLUOROSCOPY: Radiation Exposure Index (as provided by the fluoroscopic device): 34 MINUTES, 48 SECONDS, 0000000 mGy Kerma COMPLICATIONS: None PROCEDURE: Informed consent was obtained from the patient following explanation of the procedure, risks, benefits and alternatives. The patient understands, agrees and  consents for the procedure. All questions were addressed. A time out was performed prior to the initiation of the procedure. Maximal barrier sterile technique utilized including caps, mask, sterile gowns, sterile gloves, large sterile drape, hand hygiene, and Betadine prep. Ultrasound survey of the right inguinal region was performed with images stored and sent to PACs, confirming patency of the vessel. A micropuncture needle was used access the right common femoral artery under ultrasound. With excellent arterial blood flow returned, and an .018 micro wire was passed through the needle, observed enter the abdominal aorta under fluoroscopy. The needle was removed, and a micropuncture sheath was placed over the wire. The inner dilator and wire were removed, and an 035 Bentson wire was advanced under fluoroscopy into the abdominal aorta. The sheath was removed and a standard 5 [place] vascular sheath was placed. The dilator was removed and the sheath was flushed. Omni Flush catheter was passed over the wire into the lower abdominal aorta. Catheter was used to perform pelvic angiogram and then used to select the left common iliac artery. Bentson wire was passed into the left external iliac artery. Given the tortuosity, we could not achieve a distal wire position in the Omni Flush catheter was prone to prolapse into the abdominal aorta. Bentson wire was removed and a stiff glide was placed. Tortuosity again was prohibitive in having a distal wire position with a stiff glide catheter, with the Omni Flush catheter prolapsing into the aorta. C2 catheter was then passed on the Glidewire with an attempt to select the left common iliac artery. The small caliber distal aorta and the tortuosity precluded selection of the common iliac artery with the C2 Cobra catheter. Exchange was then made again for the Omni Flush catheter. A  standard Glidewire was passed through the Omni Flush catheter, achieving a distal position within the  external iliac artery. The Omni Flush catheter was removed. A 4 French glide catheter was then advanced on the standard Glidewire to the distal external iliac artery. Glidewire was removed and the Bentson wire was placed into the external iliac artery. Glide cath was then removed from the Bentson wire and the 15 cm prostate arterial catheter was passed up in over the bifurcation to form the re-curve. Bentson wire was removed. Catheter was withdrawn to the origin of the internal iliac artery and then navigated into the internal iliac artery to the bifurcation. Angiogram was performed, 35 degrees ipsilateral oblique with 10 degrees caudal-cranial. 3 cc/second for a total of 15 cc of contrast. After evaluation of the arteries, we then performed some additional hand injections of the bifurcation with different projections to identified the artery of interest. Alpha Progreat microcatheter was then used to select the first artery of interest with 014 fathom wire. Once we elected the artery of interest, angiogram was performed in the ipsilateral oblique and the AP. Flat panel CT was then performed. 0.3 cc/second for a total of 10 cc of contrast, with 10 second delay before imaging. We reviewed the images, which showed that the first artery of selection and injection was in fact superior vesical artery. Microcatheter was withdrawn to the second artery of interest. Microcatheter was advanced with the fathom wire into the proximal target artery. Angiogram in the AP and ipsilateral oblique projection was performed. A flat panel spin CT was performed. 0.3 cc/second for a total of 10 cc of contrast, with 10 second delay before imaging. We reviewed these images, which showed [place] that the current artery of interest was in fact the left prostate artery. The initial catheter position demonstrated some nontarget opacification to the bladder base/rectum. The catheter was advanced further on the fathom wire, and with the more distal  position, there was preferential filling of the anterolateral division of the prostate artery. Before embolization, we gently infused 2.5 mg verapamil intra arterial through the microcatheter. We then performed embolization with 300-500 micron embosphere, with a total 15 cc solution, a 30% dilution (5 cc of embosphere and 10 cc of contrast). Approximately 30% of the vial was used for embolization of the left prostate half. Once stasis was achieved, microcatheter was withdrawn and we interrogated 1 additional branch of interest. This was not embolized as there appear to be some contamination/cross-filling of rectum/bladder base. Microcatheter was removed. The prostate arterial catheter was withdrawn into the abdominal aorta and used to select the ipsilateral iliac artery. Catheter was advanced into the internal iliac artery with the use of the standard Glidewire. Once the catheter was in position angiogram was performed. Angiogram was performed first in the AP position, and then in the ipsilateral oblique, 35 degrees with 10 degrees of caudal-cranial angulation. We reviewed images, and elected to pursue selective angiogram of the most tortuous of the branch arteries arising from the proximal gluteal pudendal trunk. Microcatheter was advanced with a synchro wire into the artery of interest. Once we confirmed catheter position within the artery the wire was removed. Angiogram was performed in the ipsilateral oblique and AP projection. Flat panel CT was performed. 0.3 cc/second for a total of 10 cc of contrast, with 10 second delay before imaging. Intra arterial verapamil was administered on the right, 2.5 mg. We then performed embolization with 300-500 micron embosphere. Of the 30% dilution, we used a proximally additional  30% of the vial. A total of 10 cc of EMBO solution was used for the entire prostate. One stasis was achieved all catheters and wires were removed. Celt was deployed for hemostasis. Patient tolerated  the procedure well and remained hemodynamically stable throughout. No complications were encountered and no significant blood loss. FINDINGS: Ultrasound demonstrates patent right common femoral artery Angiogram of the pelvis demonstrates tortuosity of the bilateral iliac arteries of adequate caliber. On the left, there is standard type a internal iliac artery configuration, with posterior division as the superior gluteal artery in the anterior division as the gluteal-pudendal trunk. No obturator artery was identified as the prior CT shows a replaced operator from the external iliac artery. The prostate artery was identified as a type 1 artery, associated with the superior vesicle arteries. Embolization was performed to stasis. On the right, there is standard type a internal iliac artery configuration. Posterior division is the superior gluteal artery in the anterior division as a gluteal pudendal trunk. The prostate artery on the right appears to be a type 2 configuration, arising from the gluteal-pudendal trunk separate from the superior vesicle arteries. Embolization was performed to stasis. IMPRESSION: Status post ultrasound guided access right common femoral artery for pelvic angiogram, bilateral super selective catheter placement into the prostate arteries, and embolization to stasis after confirmation with flat panel CT imaging. Celt for hemostasis Signed, Dulcy Fanny. Nadene Rubins, RPVI [facility] Radiology Electronically Signed   By: Corrie Mckusick D.O.   On: 07/31/2022 15:48   IR Angiogram Pelvis Selective Or Supraselective  Result Date: 07/31/2022 INDICATION: 82 year old male presents for prostate gland embolization EXAM: ULTRASOUND-GUIDED ACCESS RIGHT COMMON FEMORAL ARTERY PELVIC ANGIOGRAM SUPER SELECTIVE CATHETER PLACEMENT INTO THE LEFT SUPERIOR VESICLE ARTERY, LEFT PROSTATE ARTERY, AND RIGHT PROSTATE ARTERY EMBOLIZATION OF BILATERAL PROSTATE ARTERIES  CELT FOR HEMOSTASIS MEDICATIONS: 5 mg verapamil intra arterial ANESTHESIA/SEDATION: Moderate (conscious) sedation was employed during this procedure. A total of Versed 2.0 mg and Fentanyl 100 mcg was administered intravenously by the radiology nurse. Total intra-service moderate Sedation Time: 134 minutes. The patient's level of consciousness and vital signs were monitored continuously by radiology nursing throughout the procedure under my direct supervision. CONTRAST:  150 CC FLUOROSCOPY: Radiation Exposure Index (as provided by the fluoroscopic device): 34 MINUTES, 48 SECONDS, 0000000 mGy Kerma COMPLICATIONS: None PROCEDURE: Informed consent was obtained from the patient following explanation of the procedure, risks, benefits and alternatives. The patient understands, agrees and consents for the procedure. All questions were addressed. A time out was performed prior to the initiation of the procedure. Maximal barrier sterile technique utilized including caps, mask, sterile gowns, sterile gloves, large sterile drape, hand hygiene, and Betadine prep. Ultrasound survey of the right inguinal region was performed with images stored and sent to PACs, confirming patency of the vessel. A micropuncture needle was used access the right common femoral artery under ultrasound. With excellent arterial blood flow returned, and an .018 micro wire was passed through the needle, observed enter the abdominal aorta under fluoroscopy. The needle was removed, and a micropuncture sheath was placed over the wire. The inner dilator and wire were removed, and an 035 Bentson wire was advanced under fluoroscopy into the abdominal aorta. The sheath was removed and a standard 5 [place] vascular sheath was placed. The dilator was removed and the sheath was flushed. Omni Flush catheter was passed over the wire into the lower abdominal aorta. Catheter was used to perform pelvic angiogram and then used to select  the left common iliac artery. Bentson  wire was passed into the left external iliac artery. Given the tortuosity, we could not achieve a distal wire position in the Omni Flush catheter was prone to prolapse into the abdominal aorta. Bentson wire was removed and a stiff glide was placed. Tortuosity again was prohibitive in having a distal wire position with a stiff glide catheter, with the Omni Flush catheter prolapsing into the aorta. C2 catheter was then passed on the Glidewire with an attempt to select the left common iliac artery. The small caliber distal aorta and the tortuosity precluded selection of the common iliac artery with the C2 Cobra catheter. Exchange was then made again for the Omni Flush catheter. A standard Glidewire was passed through the Omni Flush catheter, achieving a distal position within the external iliac artery. The Omni Flush catheter was removed. A 4 French glide catheter was then advanced on the standard Glidewire to the distal external iliac artery. Glidewire was removed and the Bentson wire was placed into the external iliac artery. Glide cath was then removed from the Bentson wire and the 15 cm prostate arterial catheter was passed up in over the bifurcation to form the re-curve. Bentson wire was removed. Catheter was withdrawn to the origin of the internal iliac artery and then navigated into the internal iliac artery to the bifurcation. Angiogram was performed, 35 degrees ipsilateral oblique with 10 degrees caudal-cranial. 3 cc/second for a total of 15 cc of contrast. After evaluation of the arteries, we then performed some additional hand injections of the bifurcation with different projections to identified the artery of interest. Alpha Progreat microcatheter was then used to select the first artery of interest with 014 fathom wire. Once we elected the artery of interest, angiogram was performed in the ipsilateral oblique and the AP. Flat panel CT was then performed. 0.3 cc/second for a total of 10 cc of contrast, with  10 second delay before imaging. We reviewed the images, which showed that the first artery of selection and injection was in fact superior vesical artery. Microcatheter was withdrawn to the second artery of interest. Microcatheter was advanced with the fathom wire into the proximal target artery. Angiogram in the AP and ipsilateral oblique projection was performed. A flat panel spin CT was performed. 0.3 cc/second for a total of 10 cc of contrast, with 10 second delay before imaging. We reviewed these images, which showed [place] that the current artery of interest was in fact the left prostate artery. The initial catheter position demonstrated some nontarget opacification to the bladder base/rectum. The catheter was advanced further on the fathom wire, and with the more distal position, there was preferential filling of the anterolateral division of the prostate artery. Before embolization, we gently infused 2.5 mg verapamil intra arterial through the microcatheter. We then performed embolization with 300-500 micron embosphere, with a total 15 cc solution, a 30% dilution (5 cc of embosphere and 10 cc of contrast). Approximately 30% of the vial was used for embolization of the left prostate half. Once stasis was achieved, microcatheter was withdrawn and we interrogated 1 additional branch of interest. This was not embolized as there appear to be some contamination/cross-filling of rectum/bladder base. Microcatheter was removed. The prostate arterial catheter was withdrawn into the abdominal aorta and used to select the ipsilateral iliac artery. Catheter was advanced into the internal iliac artery with the use of the standard Glidewire. Once the catheter was in position angiogram was performed. Angiogram was performed first in the  AP position, and then in the ipsilateral oblique, 35 degrees with 10 degrees of caudal-cranial angulation. We reviewed images, and elected to pursue selective angiogram of the most tortuous of  the branch arteries arising from the proximal gluteal pudendal trunk. Microcatheter was advanced with a synchro wire into the artery of interest. Once we confirmed catheter position within the artery the wire was removed. Angiogram was performed in the ipsilateral oblique and AP projection. Flat panel CT was performed. 0.3 cc/second for a total of 10 cc of contrast, with 10 second delay before imaging. Intra arterial verapamil was administered on the right, 2.5 mg. We then performed embolization with 300-500 micron embosphere. Of the 30% dilution, we used a proximally additional 30% of the vial. A total of 10 cc of EMBO solution was used for the entire prostate. One stasis was achieved all catheters and wires were removed. Celt was deployed for hemostasis. Patient tolerated the procedure well and remained hemodynamically stable throughout. No complications were encountered and no significant blood loss. FINDINGS: Ultrasound demonstrates patent right common femoral artery Angiogram of the pelvis demonstrates tortuosity of the bilateral iliac arteries of adequate caliber. On the left, there is standard type a internal iliac artery configuration, with posterior division as the superior gluteal artery in the anterior division as the gluteal-pudendal trunk. No obturator artery was identified as the prior CT shows a replaced operator from the external iliac artery. The prostate artery was identified as a type 1 artery, associated with the superior vesicle arteries. Embolization was performed to stasis. On the right, there is standard type a internal iliac artery configuration. Posterior division is the superior gluteal artery in the anterior division as a gluteal pudendal trunk. The prostate artery on the right appears to be a type 2 configuration, arising from the gluteal-pudendal trunk separate from the superior vesicle arteries. Embolization was performed to stasis. IMPRESSION: Status post ultrasound guided access  right common femoral artery for pelvic angiogram, bilateral super selective catheter placement into the prostate arteries, and embolization to stasis after confirmation with flat panel CT imaging. Celt for hemostasis Signed, Dulcy Fanny. Nadene Rubins, RPVI [facility] Inter[facility] Radiology Specialists [facility] Radiology Electronically Signed   By: Corrie Mckusick D.O.   On: 07/31/2022 15:48   IR Angiogram Selective Each Additional Vessel  Result Date: 07/31/2022 INDICATION: 82 year old male presents for prostate gland embolization EXAM: ULTRASOUND-GUIDED ACCESS RIGHT COMMON FEMORAL ARTERY PELVIC ANGIOGRAM SUPER SELECTIVE CATHETER PLACEMENT INTO THE LEFT SUPERIOR VESICLE ARTERY, LEFT PROSTATE ARTERY, AND RIGHT PROSTATE ARTERY EMBOLIZATION OF BILATERAL PROSTATE ARTERIES CELT FOR HEMOSTASIS MEDICATIONS: 5 mg verapamil intra arterial ANESTHESIA/SEDATION: Moderate (conscious) sedation was employed during this procedure. A total of Versed 2.0 mg and Fentanyl 100 mcg was administered intravenously by the radiology nurse. Total intra-service moderate Sedation Time: 134 minutes. The patient's level of consciousness and vital signs were monitored continuously by radiology nursing throughout the procedure under my direct supervision. CONTRAST:  150 CC FLUOROSCOPY: Radiation Exposure Index (as provided by the fluoroscopic device): 34 MINUTES, 48 SECONDS, 0000000 mGy Kerma COMPLICATIONS: None PROCEDURE: Informed consent was obtained from the patient following explanation of the procedure, risks, benefits and alternatives. The patient understands, agrees and consents for the procedure. All questions were addressed. A time out was performed prior to the initiation of the procedure. Maximal barrier sterile technique utilized including caps, mask, sterile gowns, sterile gloves, large sterile drape, hand hygiene, and Betadine prep. Ultrasound survey of the right inguinal region was performed with images stored and sent to  PACs,  confirming patency of the vessel. A micropuncture needle was used access the right common femoral artery under ultrasound. With excellent arterial blood flow returned, and an .018 micro wire was passed through the needle, observed enter the abdominal aorta under fluoroscopy. The needle was removed, and a micropuncture sheath was placed over the wire. The inner dilator and wire were removed, and an 035 Bentson wire was advanced under fluoroscopy into the abdominal aorta. The sheath was removed and a standard 5 [place] vascular sheath was placed. The dilator was removed and the sheath was flushed. Omni Flush catheter was passed over the wire into the lower abdominal aorta. Catheter was used to perform pelvic angiogram and then used to select the left common iliac artery. Bentson wire was passed into the left external iliac artery. Given the tortuosity, we could not achieve a distal wire position in the Omni Flush catheter was prone to prolapse into the abdominal aorta. Bentson wire was removed and a stiff glide was placed. Tortuosity again was prohibitive in having a distal wire position with a stiff glide catheter, with the Omni Flush catheter prolapsing into the aorta. C2 catheter was then passed on the Glidewire with an attempt to select the left common iliac artery. The small caliber distal aorta and the tortuosity precluded selection of the common iliac artery with the C2 Cobra catheter. Exchange was then made again for the Omni Flush catheter. A standard Glidewire was passed through the Omni Flush catheter, achieving a distal position within the external iliac artery. The Omni Flush catheter was removed. A 4 French glide catheter was then advanced on the standard Glidewire to the distal external iliac artery. Glidewire was removed and the Bentson wire was placed into the external iliac artery. Glide cath was then removed from the Bentson wire and the 15 cm prostate arterial catheter was passed up in over the  bifurcation to form the re-curve. Bentson wire was removed. Catheter was withdrawn to the origin of the internal iliac artery and then navigated into the internal iliac artery to the bifurcation. Angiogram was performed, 35 degrees ipsilateral oblique with 10 degrees caudal-cranial. 3 cc/second for a total of 15 cc of contrast. After evaluation of the arteries, we then performed some additional hand injections of the bifurcation with different projections to identified the artery of interest. Alpha Progreat microcatheter was then used to select the first artery of interest with 014 fathom wire. Once we elected the artery of interest, angiogram was performed in the ipsilateral oblique and the AP. Flat panel CT was then performed. 0.3 cc/second for a total of 10 cc of contrast, with 10 second delay before imaging. We reviewed the images, which showed that the first artery of selection and injection was in fact superior vesical artery. Microcatheter was withdrawn to the second artery of interest. Microcatheter was advanced with the fathom wire into the proximal target artery. Angiogram in the AP and ipsilateral oblique projection was performed. A flat panel spin CT was performed. 0.3 cc/second for a total of 10 cc of contrast, with 10 second delay before imaging. We reviewed these images, which showed [place] that the current artery of interest was in fact the left prostate artery. The initial catheter position demonstrated some nontarget opacification to the bladder base/rectum. The catheter was advanced further on the fathom wire, and with the more distal position, there was preferential filling of the anterolateral division of the prostate artery. Before embolization, we gently infused 2.5 mg verapamil intra arterial through  the microcatheter. We then performed embolization with 300-500 micron embosphere, with a total 15 cc solution, a 30% dilution (5 cc of embosphere and 10 cc of contrast). Approximately 30% of the vial  was used for embolization of the left prostate half. Once stasis was achieved, microcatheter was withdrawn and we interrogated 1 additional branch of interest. This was not embolized as there appear to be some contamination/cross-filling of rectum/bladder base. Microcatheter was removed. The prostate arterial catheter was withdrawn into the abdominal aorta and used to select the ipsilateral iliac artery. Catheter was advanced into the internal iliac artery with the use of the standard Glidewire. Once the catheter was in position angiogram was performed. Angiogram was performed first in the AP position, and then in the ipsilateral oblique, 35 degrees with 10 degrees of caudal-cranial angulation. We reviewed images, and elected to pursue selective angiogram of the most tortuous of the branch arteries arising from the proximal gluteal pudendal trunk. Microcatheter was advanced with a synchro wire into the artery of interest. Once we confirmed catheter position within the artery the wire was removed. Angiogram was performed in the ipsilateral oblique and AP projection. Flat panel CT was performed. 0.3 cc/second for a total of 10 cc of contrast, with 10 second delay before imaging. Intra arterial verapamil was administered on the right, 2.5 mg. We then performed embolization with 300-500 micron embosphere. Of the 30% dilution, we used a proximally additional 30% of the vial. A total of 10 cc of EMBO solution was used for the entire prostate. One stasis was achieved all catheters and wires were removed. Celt was deployed for hemostasis. Patient tolerated the procedure well and remained hemodynamically stable throughout. No complications were encountered and no significant blood loss. FINDINGS: Ultrasound demonstrates patent right common femoral artery Angiogram of the pelvis demonstrates tortuosity of the bilateral iliac arteries of adequate caliber. On the left, there is standard type a internal iliac artery  configuration, with posterior division as the superior gluteal artery in the anterior division as the gluteal-pudendal trunk. No obturator artery was identified as the prior CT shows a replaced operator from the external iliac artery. The prostate artery was identified as a type 1 artery, associated with the superior vesicle arteries. Embolization was performed to stasis. On the right, there is standard type a internal iliac artery configuration. Posterior division is the superior gluteal artery in the anterior division as a gluteal pudendal trunk. The prostate artery on the right appears to be a type 2 configuration, arising from the gluteal-pudendal trunk separate from the superior vesicle arteries. Embolization was performed to stasis. IMPRESSION: Status post ultrasound guided access right common femoral artery for pelvic angiogram, bilateral super selective catheter placement into the prostate arteries, and embolization to stasis after confirmation with flat panel CT imaging. Celt for hemostasis Signed, Dulcy Fanny. Nadene Rubins, RPVI Vascular and Inter[facility] Radiology Specialists [facility] Radiology Electronically Signed   By: Corrie Mckusick D.O.   On: 07/31/2022 15:48   IR Angiogram Selective Each Additional Vessel  Result Date: 07/31/2022 INDICATION: 82 year old male presents for prostate gland embolization EXAM: ULTRASOUND-GUIDED ACCESS RIGHT COMMON FEMORAL ARTERY PELVIC ANGIOGRAM SUPER SELECTIVE CATHETER PLACEMENT INTO THE LEFT SUPERIOR VESICLE ARTERY, LEFT PROSTATE ARTERY, AND RIGHT PROSTATE ARTERY EMBOLIZATION OF BILATERAL PROSTATE ARTERIES CELT FOR HEMOSTASIS MEDICATIONS: 5 mg verapamil intra arterial ANESTHESIA/SEDATION: Moderate (conscious) sedation was employed during this procedure. A total of Versed 2.0 mg and Fentanyl 100 mcg was administered intravenously by the radiology nurse. Total intra-service moderate Sedation Time: 134  minutes. The patient's level of consciousness and vital signs  were monitored continuously by radiology nursing throughout the procedure under my direct supervision. CONTRAST:  150 CC FLUOROSCOPY: Radiation Exposure Index (as provided by the fluoroscopic device): 34 MINUTES, 48 SECONDS, 0000000 mGy Kerma COMPLICATIONS: None PROCEDURE: Informed consent was obtained from the patient following explanation of the procedure, risks, benefits and alternatives. The patient understands, agrees and consents for the procedure. All questions were addressed. A time out was performed prior to the initiation of the procedure. Maximal barrier sterile technique utilized including caps, mask, sterile gowns, sterile gloves, large sterile drape, hand hygiene, and Betadine prep. Ultrasound survey of the right inguinal region was performed with images stored and sent to PACs, confirming patency of the vessel. A micropuncture needle was used access the right common femoral artery under ultrasound. With excellent arterial blood flow returned, and an .018 micro wire was passed through the needle, observed enter the abdominal aorta under fluoroscopy. The needle was removed, and a micropuncture sheath was placed over the wire. The inner dilator and wire were removed, and an 035 Bentson wire was advanced under fluoroscopy into the abdominal aorta. The sheath was removed and a standard 5 [place] vascular sheath was placed. The dilator was removed and the sheath was flushed. Omni Flush catheter was passed over the wire into the lower abdominal aorta. Catheter was used to perform pelvic angiogram and then used to select the left common iliac artery. Bentson wire was passed into the left external iliac artery. Given the tortuosity, we could not achieve a distal wire position in the Omni Flush catheter was prone to prolapse into the abdominal aorta. Bentson wire was removed and a stiff glide was placed. Tortuosity again was prohibitive in having a distal wire position with a stiff glide catheter, with the Omni  Flush catheter prolapsing into the aorta. C2 catheter was then passed on the Glidewire with an attempt to select the left common iliac artery. The small caliber distal aorta and the tortuosity precluded selection of the common iliac artery with the C2 Cobra catheter. Exchange was then made again for the Omni Flush catheter. A standard Glidewire was passed through the Omni Flush catheter, achieving a distal position within the external iliac artery. The Omni Flush catheter was removed. A 4 French glide catheter was then advanced on the standard Glidewire to the distal external iliac artery. Glidewire was removed and the Bentson wire was placed into the external iliac artery. Glide cath was then removed from the Bentson wire and the 15 cm prostate arterial catheter was passed up in over the bifurcation to form the re-curve. Bentson wire was removed. Catheter was withdrawn to the origin of the internal iliac artery and then navigated into the internal iliac artery to the bifurcation. Angiogram was performed, 35 degrees ipsilateral oblique with 10 degrees caudal-cranial. 3 cc/second for a total of 15 cc of contrast. After evaluation of the arteries, we then performed some additional hand injections of the bifurcation with different projections to identified the artery of interest. Alpha Progreat microcatheter was then used to select the first artery of interest with 014 fathom wire. Once we elected the artery of interest, angiogram was performed in the ipsilateral oblique and the AP. Flat panel CT was then performed. 0.3 cc/second for a total of 10 cc of contrast, with 10 second delay before imaging. We reviewed the images, which showed that the first artery of selection and injection was in fact superior vesical artery. Microcatheter was  withdrawn to the second artery of interest. Microcatheter was advanced with the fathom wire into the proximal target artery. Angiogram in the AP and ipsilateral oblique projection was  performed. A flat panel spin CT was performed. 0.3 cc/second for a total of 10 cc of contrast, with 10 second delay before imaging. We reviewed these images, which showed [place] that the current artery of interest was in fact the left prostate artery. The initial catheter position demonstrated some nontarget opacification to the bladder base/rectum. The catheter was advanced further on the fathom wire, and with the more distal position, there was preferential filling of the anterolateral division of the prostate artery. Before embolization, we gently infused 2.5 mg verapamil intra arterial through the microcatheter. We then performed embolization with 300-500 micron embosphere, with a total 15 cc solution, a 30% dilution (5 cc of embosphere and 10 cc of contrast). Approximately 30% of the vial was used for embolization of the left prostate half. Once stasis was achieved, microcatheter was withdrawn and we interrogated 1 additional branch of interest. This was not embolized as there appear to be some contamination/cross-filling of rectum/bladder base. Microcatheter was removed. The prostate arterial catheter was withdrawn into the abdominal aorta and used to select the ipsilateral iliac artery. Catheter was advanced into the internal iliac artery with the use of the standard Glidewire. Once the catheter was in position angiogram was performed. Angiogram was performed first in the AP position, and then in the ipsilateral oblique, 35 degrees with 10 degrees of caudal-cranial angulation. We reviewed images, and elected to pursue selective angiogram of the most tortuous of the branch arteries arising from the proximal gluteal pudendal trunk. Microcatheter was advanced with a synchro wire into the artery of interest. Once we confirmed catheter position within the artery the wire was removed. Angiogram was performed in the ipsilateral oblique and AP projection. Flat panel CT was performed. 0.3 cc/second for a total of 10 cc of  contrast, with 10 second delay before imaging. Intra arterial verapamil was administered on the right, 2.5 mg. We then performed embolization with 300-500 micron embosphere. Of the 30% dilution, we used a proximally additional 30% of the vial. A total of 10 cc of EMBO solution was used for the entire prostate. One stasis was achieved all catheters and wires were removed. Celt was deployed for hemostasis. Patient tolerated the procedure well and remained hemodynamically stable throughout. No complications were encountered and no significant blood loss. FINDINGS: Ultrasound demonstrates patent right common femoral artery Angiogram of the pelvis demonstrates tortuosity of the bilateral iliac arteries of adequate caliber. On the left, there is standard type a internal iliac artery configuration, with posterior division as the superior gluteal artery in the anterior division as the gluteal-pudendal trunk. No obturator artery was identified as the prior CT shows a replaced operator from the external iliac artery. The prostate artery was identified as a type 1 artery, associated with the superior vesicle arteries. Embolization was performed to stasis. On the right, there is standard type a internal iliac artery configuration. Posterior division is the superior gluteal artery in the anterior division as a gluteal pudendal trunk. The prostate artery on the right appears to be a type 2 configuration, arising from the gluteal-pudendal trunk separate from the superior vesicle arteries. Embolization was performed to stasis. IMPRESSION: Status post ultrasound guided access right common femoral artery for pelvic angiogram, bilateral super selective catheter placement into the prostate arteries, and embolization to stasis after confirmation with flat panel CT imaging. Celt  for hemostasis Signed, Dulcy Fanny. Nadene Rubins, RPVI [facility] Radiology Electronically Signed   By: Corrie Mckusick D.O.   On: 07/31/2022 15:48   IR 3D Independent Darreld Mclean  Result Date: 07/31/2022 INDICATION: 82 year old male presents for prostate gland embolization EXAM: ULTRASOUND-GUIDED ACCESS RIGHT COMMON FEMORAL ARTERY PELVIC ANGIOGRAM SUPER SELECTIVE CATHETER PLACEMENT INTO THE LEFT SUPERIOR VESICLE ARTERY, LEFT PROSTATE ARTERY, AND RIGHT PROSTATE ARTERY EMBOLIZATION OF BILATERAL PROSTATE ARTERIES CELT FOR HEMOSTASIS MEDICATIONS: 5 mg verapamil intra arterial ANESTHESIA/SEDATION: Moderate (conscious) sedation was employed during this procedure. A total of Versed 2.0 mg and Fentanyl 100 mcg was administered intravenously by the radiology nurse. Total intra-service moderate Sedation Time: 134 minutes. The patient's level of consciousness and vital signs were monitored continuously by radiology nursing throughout the procedure under my direct supervision. CONTRAST:  150 CC FLUOROSCOPY: Radiation Exposure Index (as provided by the fluoroscopic device): 34 MINUTES, 48 SECONDS, 0000000 mGy Kerma COMPLICATIONS: None PROCEDURE: Informed consent was obtained from the patient following explanation of the procedure, risks, benefits and alternatives. The patient understands, agrees and consents for the procedure. All questions were addressed. A time out was performed prior to the initiation of the procedure. Maximal barrier sterile technique utilized including caps, mask, sterile gowns, sterile gloves, large sterile drape, hand hygiene, and Betadine prep. Ultrasound survey of the right inguinal region was performed with images stored and sent to PACs, confirming patency of the vessel. A micropuncture needle was used access the right common femoral artery under ultrasound. With excellent arterial blood flow returned, and an .018 micro wire was passed through the needle, observed enter the abdominal aorta under fluoroscopy. The needle was removed, and a micropuncture sheath was placed over the wire. The inner dilator and wire were  removed, and an 035 Bentson wire was advanced under fluoroscopy into the abdominal aorta. The sheath was removed and a standard 5 [place] vascular sheath was placed. The dilator was removed and the sheath was flushed. Omni Flush catheter was passed over the wire into the lower abdominal aorta. Catheter was used to perform pelvic angiogram and then used to select the left common iliac artery. Bentson wire was passed into the left external iliac artery. Given the tortuosity, we could not achieve a distal wire position in the Omni Flush catheter was prone to prolapse into the abdominal aorta. Bentson wire was removed and a stiff glide was placed. Tortuosity again was prohibitive in having a distal wire position with a stiff glide catheter, with the Omni Flush catheter prolapsing into the aorta. C2 catheter was then passed on the Glidewire with an attempt to select the left common iliac artery. The small caliber distal aorta and the tortuosity precluded selection of the common iliac artery with the C2 Cobra catheter. Exchange was then made again for the Omni Flush catheter. A standard Glidewire was passed through the Omni Flush catheter, achieving a distal position within the external iliac artery. The Omni Flush catheter was removed. A 4 French glide catheter was then advanced on the standard Glidewire to the distal external iliac artery. Glidewire was removed and the Bentson wire was placed into the external iliac artery. Glide cath was then removed from the Bentson wire and the 15 cm prostate arterial catheter was passed up in over the bifurcation to form the re-curve. Bentson wire was removed. Catheter was withdrawn to the origin of the internal iliac artery and then navigated into the internal iliac artery to the bifurcation. Angiogram was performed,  35 degrees ipsilateral oblique with 10 degrees caudal-cranial. 3 cc/second for a total of 15 cc of contrast. After evaluation of the arteries, we then performed some  additional hand injections of the bifurcation with different projections to identified the artery of interest. Alpha Progreat microcatheter was then used to select the first artery of interest with 014 fathom wire. Once we elected the artery of interest, angiogram was performed in the ipsilateral oblique and the AP. Flat panel CT was then performed. 0.3 cc/second for a total of 10 cc of contrast, with 10 second delay before imaging. We reviewed the images, which showed that the first artery of selection and injection was in fact superior vesical artery. Microcatheter was withdrawn to the second artery of interest. Microcatheter was advanced with the fathom wire into the proximal target artery. Angiogram in the AP and ipsilateral oblique projection was performed. A flat panel spin CT was performed. 0.3 cc/second for a total of 10 cc of contrast, with 10 second delay before imaging. We reviewed these images, which showed [place] that the current artery of interest was in fact the left prostate artery. The initial catheter position demonstrated some nontarget opacification to the bladder base/rectum. The catheter was advanced further on the fathom wire, and with the more distal position, there was preferential filling of the anterolateral division of the prostate artery. Before embolization, we gently infused 2.5 mg verapamil intra arterial through the microcatheter. We then performed embolization with 300-500 micron embosphere, with a total 15 cc solution, a 30% dilution (5 cc of embosphere and 10 cc of contrast). Approximately 30% of the vial was used for embolization of the left prostate half. Once stasis was achieved, microcatheter was withdrawn and we interrogated 1 additional branch of interest. This was not embolized as there appear to be some contamination/cross-filling of rectum/bladder base. Microcatheter was removed. The prostate arterial catheter was withdrawn into the abdominal aorta and used to select the  ipsilateral iliac artery. Catheter was advanced into the internal iliac artery with the use of the standard Glidewire. Once the catheter was in position angiogram was performed. Angiogram was performed first in the AP position, and then in the ipsilateral oblique, 35 degrees with 10 degrees of caudal-cranial angulation. We reviewed images, and elected to pursue selective angiogram of the most tortuous of the branch arteries arising from the proximal gluteal pudendal trunk. Microcatheter was advanced with a synchro wire into the artery of interest. Once we confirmed catheter position within the artery the wire was removed. Angiogram was performed in the ipsilateral oblique and AP projection. Flat panel CT was performed. 0.3 cc/second for a total of 10 cc of contrast, with 10 second delay before imaging. Intra arterial verapamil was administered on the right, 2.5 mg. We then performed embolization with 300-500 micron embosphere. Of the 30% dilution, we used a proximally additional 30% of the vial. A total of 10 cc of EMBO solution was used for the entire prostate. One stasis was achieved all catheters and wires were removed. Celt was deployed for hemostasis. Patient tolerated the procedure well and remained hemodynamically stable throughout. No complications were encountered and no significant blood loss. FINDINGS: Ultrasound demonstrates patent right common femoral artery Angiogram of the pelvis demonstrates tortuosity of the bilateral iliac arteries of adequate caliber. On the left, there is standard type a internal iliac artery configuration, with posterior division as the superior gluteal artery in the anterior division as the gluteal-pudendal trunk. No obturator artery was identified as the prior CT shows  a replaced operator from the external iliac artery. The prostate artery was identified as a type 1 artery, associated with the superior vesicle arteries. Embolization was performed to stasis. On the right, there  is standard type a internal iliac artery configuration. Posterior division is the superior gluteal artery in the anterior division as a gluteal pudendal trunk. The prostate artery on the right appears to be a type 2 configuration, arising from the gluteal-pudendal trunk separate from the superior vesicle arteries. Embolization was performed to stasis. IMPRESSION: Status post ultrasound guided access right common femoral artery for pelvic angiogram, bilateral super selective catheter placement into the prostate arteries, and embolization to stasis after confirmation with flat panel CT imaging. Celt for hemostasis Signed, Dulcy Fanny. Nadene Rubins, RPVI [facility] Radiology Electronically Signed   By: Corrie Mckusick D.O.   On: 07/31/2022 15:48   IR Angiogram Selective Each Additional Vessel  Result Date: 07/31/2022 INDICATION: 82 year old male presents for prostate gland embolization EXAM: ULTRASOUND-GUIDED ACCESS RIGHT COMMON FEMORAL ARTERY PELVIC ANGIOGRAM SUPER SELECTIVE CATHETER PLACEMENT INTO THE LEFT SUPERIOR VESICLE ARTERY, LEFT PROSTATE ARTERY, AND RIGHT PROSTATE ARTERY EMBOLIZATION OF BILATERAL PROSTATE ARTERIES CELT FOR HEMOSTASIS MEDICATIONS: 5 mg verapamil intra arterial ANESTHESIA/SEDATION: Moderate (conscious) sedation was employed during this procedure. A total of Versed 2.0 mg and Fentanyl 100 mcg was administered intravenously by the radiology nurse. Total intra-service moderate Sedation Time: 134 minutes. The patient's level of consciousness and vital signs were monitored continuously by radiology nursing throughout the procedure under my direct supervision. CONTRAST:  150 CC FLUOROSCOPY: Radiation Exposure Index (as provided by the fluoroscopic device): 34 MINUTES, 48 SECONDS, 0000000 mGy Kerma COMPLICATIONS: None PROCEDURE: Informed consent was obtained from the patient following explanation of the procedure, risks, benefits and alternatives. The  patient understands, agrees and consents for the procedure. All questions were addressed. A time out was performed prior to the initiation of the procedure. Maximal barrier sterile technique utilized including caps, mask, sterile gowns, sterile gloves, large sterile drape, hand hygiene, and Betadine prep. Ultrasound survey of the right inguinal region was performed with images stored and sent to PACs, confirming patency of the vessel. A micropuncture needle was used access the right common femoral artery under ultrasound. With excellent arterial blood flow returned, and an .018 micro wire was passed through the needle, observed enter the abdominal aorta under fluoroscopy. The needle was removed, and a micropuncture sheath was placed over the wire. The inner dilator and wire were removed, and an 035 Bentson wire was advanced under fluoroscopy into the abdominal aorta. The sheath was removed and a standard 5 [place] vascular sheath was placed. The dilator was removed and the sheath was flushed. Omni Flush catheter was passed over the wire into the lower abdominal aorta. Catheter was used to perform pelvic angiogram and then used to select the left common iliac artery. Bentson wire was passed into the left external iliac artery. Given the tortuosity, we could not achieve a distal wire position in the Omni Flush catheter was prone to prolapse into the abdominal aorta. Bentson wire was removed and a stiff glide was placed. Tortuosity again was prohibitive in having a distal wire position with a stiff glide catheter, with the Omni Flush catheter prolapsing into the aorta. C2 catheter was then passed on the Glidewire with an attempt to select the left common iliac artery. The small caliber distal aorta and the tortuosity precluded selection of the common iliac artery with the C2 Cobra catheter. Exchange  was then made again for the Omni Flush catheter. A standard Glidewire was passed through the Omni Flush catheter,  achieving a distal position within the external iliac artery. The Omni Flush catheter was removed. A 4 French glide catheter was then advanced on the standard Glidewire to the distal external iliac artery. Glidewire was removed and the Bentson wire was placed into the external iliac artery. Glide cath was then removed from the Bentson wire and the 15 cm prostate arterial catheter was passed up in over the bifurcation to form the re-curve. Bentson wire was removed. Catheter was withdrawn to the origin of the internal iliac artery and then navigated into the internal iliac artery to the bifurcation. Angiogram was performed, 35 degrees ipsilateral oblique with 10 degrees caudal-cranial. 3 cc/second for a total of 15 cc of contrast. After evaluation of the arteries, we then performed some additional hand injections of the bifurcation with different projections to identified the artery of interest. Alpha Progreat microcatheter was then used to select the first artery of interest with 014 fathom wire. Once we elected the artery of interest, angiogram was performed in the ipsilateral oblique and the AP. Flat panel CT was then performed. 0.3 cc/second for a total of 10 cc of contrast, with 10 second delay before imaging. We reviewed the images, which showed that the first artery of selection and injection was in fact superior vesical artery. Microcatheter was withdrawn to the second artery of interest. Microcatheter was advanced with the fathom wire into the proximal target artery. Angiogram in the AP and ipsilateral oblique projection was performed. A flat panel spin CT was performed. 0.3 cc/second for a total of 10 cc of contrast, with 10 second delay before imaging. We reviewed these images, which showed [place] that the current artery of interest was in fact the left prostate artery. The initial catheter position demonstrated some nontarget opacification to the bladder base/rectum. The catheter was advanced further on the  fathom wire, and with the more distal position, there was preferential filling of the anterolateral division of the prostate artery. Before embolization, we gently infused 2.5 mg verapamil intra arterial through the microcatheter. We then performed embolization with 300-500 micron embosphere, with a total 15 cc solution, a 30% dilution (5 cc of embosphere and 10 cc of contrast). Approximately 30% of the vial was used for embolization of the left prostate half. Once stasis was achieved, microcatheter was withdrawn and we interrogated 1 additional branch of interest. This was not embolized as there appear to be some contamination/cross-filling of rectum/bladder base. Microcatheter was removed. The prostate arterial catheter was withdrawn into the abdominal aorta and used to select the ipsilateral iliac artery. Catheter was advanced into the internal iliac artery with the use of the standard Glidewire. Once the catheter was in position angiogram was performed. Angiogram was performed first in the AP position, and then in the ipsilateral oblique, 35 degrees with 10 degrees of caudal-cranial angulation. We reviewed images, and elected to pursue selective angiogram of the most tortuous of the branch arteries arising from the proximal gluteal pudendal trunk. Microcatheter was advanced with a synchro wire into the artery of interest. Once we confirmed catheter position within the artery the wire was removed. Angiogram was performed in the ipsilateral oblique and AP projection. Flat panel CT was performed. 0.3 cc/second for a total of 10 cc of contrast, with 10 second delay before imaging. Intra arterial verapamil was administered on the right, 2.5 mg. We then performed embolization with 300-500 micron  embosphere. Of the 30% dilution, we used a proximally additional 30% of the vial. A total of 10 cc of EMBO solution was used for the entire prostate. One stasis was achieved all catheters and wires were removed. Celt was  deployed for hemostasis. Patient tolerated the procedure well and remained hemodynamically stable throughout. No complications were encountered and no significant blood loss. FINDINGS: Ultrasound demonstrates patent right common femoral artery Angiogram of the pelvis demonstrates tortuosity of the bilateral iliac arteries of adequate caliber. On the left, there is standard type a internal iliac artery configuration, with posterior division as the superior gluteal artery in the anterior division as the gluteal-pudendal trunk. No obturator artery was identified as the prior CT shows a replaced operator from the external iliac artery. The prostate artery was identified as a type 1 artery, associated with the superior vesicle arteries. Embolization was performed to stasis. On the right, there is standard type a internal iliac artery configuration. Posterior division is the superior gluteal artery in the anterior division as a gluteal pudendal trunk. The prostate artery on the right appears to be a type 2 configuration, arising from the gluteal-pudendal trunk separate from the superior vesicle arteries. Embolization was performed to stasis. IMPRESSION: Status post ultrasound guided access right common femoral artery for pelvic angiogram, bilateral super selective catheter placement into the prostate arteries, and embolization to stasis after confirmation with flat panel CT imaging. Celt for hemostasis Signed, Dulcy Fanny. Nadene Rubins, RPVI [facility] Radiology Special[facility] Radiology Electronically Signed   By: Corrie Mckusick D.O.   On: 07/31/2022 15:48   IR Angiogram Selective Each Additional Vessel  Result Date: 07/31/2022 INDICATION: 82 year old male presents for prostate gland embolization EXAM: ULTRASOUND-GUIDED ACCESS RIGHT COMMON FEMORAL ARTERY PELVIC ANGIOGRAM SUPER SELECTIVE CATHETER PLACEMENT INTO THE LEFT SUPERIOR VESICLE ARTERY, LEFT PROSTATE ARTERY, AND RIGHT PROSTATE ARTERY  EMBOLIZATION OF BILATERAL PROSTATE ARTERIES CELT FOR HEMOSTASIS MEDICATIONS: 5 mg verapamil intra arterial ANESTHESIA/SEDATION: Moderate (conscious) sedation was employed during this procedure. A total of Versed 2.0 mg and Fentanyl 100 mcg was administered intravenously by the radiology nurse. Total intra-service moderate Sedation Time: 134 minutes. The patient's level of consciousness and vital signs were monitored continuously by radiology nursing throughout the procedure under my direct supervision. CONTRAST:  150 CC FLUOROSCOPY: Radiation Exposure Index (as provided by the fluoroscopic device): 34 MINUTES, 48 SECONDS, 0000000 mGy Kerma COMPLICATIONS: None PROCEDURE: Informed consent was obtained from the patient following explanation of the procedure, risks, benefits and alternatives. The patient understands, agrees and consents for the procedure. All questions were addressed. A time out was performed prior to the initiation of the procedure. Maximal barrier sterile technique utilized including caps, mask, sterile gowns, sterile gloves, large sterile drape, hand hygiene, and Betadine prep. Ultrasound survey of the right inguinal region was performed with images stored and sent to PACs, confirming patency of the vessel. A micropuncture needle was used access the right common femoral artery under ultrasound. With excellent arterial blood flow returned, and an .018 micro wire was passed through the needle, observed enter the abdominal aorta under fluoroscopy. The needle was removed, and a micropuncture sheath was placed over the wire. The inner dilator and wire were removed, and an 035 Bentson wire was advanced under fluoroscopy into the abdominal aorta. The sheath was removed and a standard 5 [place] vascular sheath was placed. The dilator was removed and the sheath was flushed. Omni Flush catheter was passed over the wire into the lower abdominal aorta. Catheter was  used to perform pelvic angiogram and then used to  select the left common iliac artery. Bentson wire was passed into the left external iliac artery. Given the tortuosity, we could not achieve a distal wire position in the Omni Flush catheter was prone to prolapse into the abdominal aorta. Bentson wire was removed and a stiff glide was placed. Tortuosity again was prohibitive in having a distal wire position with a stiff glide catheter, with the Omni Flush catheter prolapsing into the aorta. C2 catheter was then passed on the Glidewire with an attempt to select the left common iliac artery. The small caliber distal aorta and the tortuosity precluded selection of the common iliac artery with the C2 Cobra catheter. Exchange was then made again for the Omni Flush catheter. A standard Glidewire was passed through the Omni Flush catheter, achieving a distal position within the external iliac artery. The Omni Flush catheter was removed. A 4 French glide catheter was then advanced on the standard Glidewire to the distal external iliac artery. Glidewire was removed and the Bentson wire was placed into the external iliac artery. Glide cath was then removed from the Bentson wire and the 15 cm prostate arterial catheter was passed up in over the bifurcation to form the re-curve. Bentson wire was removed. Catheter was withdrawn to the origin of the internal iliac artery and then navigated into the internal iliac artery to the bifurcation. Angiogram was performed, 35 degrees ipsilateral oblique with 10 degrees caudal-cranial. 3 cc/second for a total of 15 cc of contrast. After evaluation of the arteries, we then performed some additional hand injections of the bifurcation with different projections to identified the artery of interest. Alpha Progreat microcatheter was then used to select the first artery of interest with 014 fathom wire. Once we elected the artery of interest, angiogram was performed in the ipsilateral oblique and the AP. Flat panel CT was then performed. 0.3  cc/second for a total of 10 cc of contrast, with 10 second delay before imaging. We reviewed the images, which showed that the first artery of selection and injection was in fact superior vesical artery. Microcatheter was withdrawn to the second artery of interest. Microcatheter was advanced with the fathom wire into the proximal target artery. Angiogram in the AP and ipsilateral oblique projection was performed. A flat panel spin CT was performed. 0.3 cc/second for a total of 10 cc of contrast, with 10 second delay before imaging. We reviewed these images, which showed [place] that the current artery of interest was in fact the left prostate artery. The initial catheter position demonstrated some nontarget opacification to the bladder base/rectum. The catheter was advanced further on the fathom wire, and with the more distal position, there was preferential filling of the anterolateral division of the prostate artery. Before embolization, we gently infused 2.5 mg verapamil intra arterial through the microcatheter. We then performed embolization with 300-500 micron embosphere, with a total 15 cc solution, a 30% dilution (5 cc of embosphere and 10 cc of contrast). Approximately 30% of the vial was used for embolization of the left prostate half. Once stasis was achieved, microcatheter was withdrawn and we interrogated 1 additional branch of interest. This was not embolized as there appear to be some contamination/cross-filling of rectum/bladder base. Microcatheter was removed. The prostate arterial catheter was withdrawn into the abdominal aorta and used to select the ipsilateral iliac artery. Catheter was advanced into the internal iliac artery with the use of the standard Glidewire. Once the catheter was in  position angiogram was performed. Angiogram was performed first in the AP position, and then in the ipsilateral oblique, 35 degrees with 10 degrees of caudal-cranial angulation. We reviewed images, and elected to  pursue selective angiogram of the most tortuous of the branch arteries arising from the proximal gluteal pudendal trunk. Microcatheter was advanced with a synchro wire into the artery of interest. Once we confirmed catheter position within the artery the wire was removed. Angiogram was performed in the ipsilateral oblique and AP projection. Flat panel CT was performed. 0.3 cc/second for a total of 10 cc of contrast, with 10 second delay before imaging. Intra arterial verapamil was administered on the right, 2.5 mg. We then performed embolization with 300-500 micron embosphere. Of the 30% dilution, we used a proximally additional 30% of the vial. A total of 10 cc of EMBO solution was used for the entire prostate. One stasis was achieved all catheters and wires were removed. Celt was deployed for hemostasis. Patient tolerated the procedure well and remained hemodynamically stable throughout. No complications were encountered and no significant blood loss. FINDINGS: Ultrasound demonstrates patent right common femoral artery Angiogram of the pelvis demonstrates tortuosity of the bilateral iliac arteries of adequate caliber. On the left, there is standard type a internal iliac artery configuration, with posterior division as the superior gluteal artery in the anterior division as the gluteal-pudendal trunk. No obturator artery was identified as the prior CT shows a replaced operator from the external iliac artery. The prostate artery was identified as a type 1 artery, associated with the superior vesicle arteries. Embolization was performed to stasis. On the right, there is standard type a internal iliac artery configuration. Posterior division is the superior gluteal artery in the anterior division as a gluteal pudendal trunk. The prostate artery on the right appears to be a type 2 configuration, arising from the gluteal-pudendal trunk separate from the superior vesicle arteries. Embolization was performed to stasis.  IMPRESSION: Status post ultrasound guided access right common femoral artery for pelvic angiogram, bilateral super selective catheter placement into the prostate arteries, and embolization to stasis after confirmation with flat panel CT imaging. Celt for hemostasis Signed, Dulcy Fanny. Nadene Rubins, RPVI [facility] Electronically Signed   By: Corrie Mckusick D.O.   On: 07/31/2022 15:48    Labs:  CBC: Recent Labs    05/01/22 1838 07/01/22 2257 07/03/22 1701 07/31/22 0800  WBC 8.0 4.5 6.4 5.6  HGB 16.1 16.4 15.8 16.2  HCT 49.0 49.2 48.4 49.0  PLT 168 122* 144* 135*    COAGS: Recent Labs    09/04/21 1324 04/13/22 1010 07/31/22 0800  INR 1.2 1.1 1.0    BMP: Recent Labs    05/01/22 1838 07/01/22 2257 07/03/22 1701 07/31/22 0800  NA 143 140 141 138  K 3.7 3.7 3.8 4.1  CL 109 107 107 107  CO2 26 26 25 28   GLUCOSE 121* 104* 107* 106*  BUN 21 15 18 14   CALCIUM 9.8 9.8 9.6 9.5  CREATININE 1.02 0.87 1.15 1.04  GFRNONAA >60 >60 >60 >60    LIVER FUNCTION TESTS: Recent Labs    09/27/21 2120 04/13/22 1010 05/01/22 1838 07/31/22 0800  BILITOT 0.6 0.4 0.5 0.6  AST 23 16 18 20   ALT 14 14 18 14   ALKPHOS 61 58 61 73  PROT 7.3 7.3 7.6 7.8  ALBUMIN 3.8 4.0 3.9 4.1    TUMOR MARKERS: No results for input(s): "AFPTM", "CEA", "CA199", "CHROMGRNA" in the  last 8760 hours.  Assessment and Plan:  Dustin Aurigemma is a very pleasant 82 yo male now SP PAE for treatment of severe  (IPSS = 21) LUTS secondary to BPH and a very large prostate (~150grams)  Treatment was performed with bilateral PA embolization 07/31/22.   He went through a very typical course of post-embolization syndrome, with the most frustrating symptom being urethritis.  He was treated with prescription of pyridium.  This resolved within the past week, and has not recurred.   At this point, he reports voiding adequately, with no new symptom of double  voiding/incomplete emptying, or new urgency type symptoms.    I did encourage them to observe today's Urology appointment.    We will set up a 6 month follow up appointment.     Plan: - 6 month follow up appointment with Dr. Earleen Newport.  No imaging needed at that time.     Electronically Signed: Corrie Mckusick 08/21/2022, 9:06 AM   I spent a total of    15 Minutes in remote  clinical consultation, greater than 50% of which was counseling/coordinating care for SP bilateral PAE for LUTS.    Visit type: Audio only (telephone). Audio (no video) only due to patient's lack of internet/smartphone capability. Alternative for in-person consultation at [facility], [address] [place] [address], [place], [place]. This visit type was conducted due to national recommendations for restrictions regarding the COVID-19 Pandemic (e.g. social distancing).  This format is felt to be most appropriate for this patient at this time.  All issues noted in this document were discussed and addressed.

## 2022-10-10 ENCOUNTER — Ambulatory Visit (INDEPENDENT_AMBULATORY_CARE_PROVIDER_SITE_OTHER): Payer: Medicare PPO

## 2022-10-10 DIAGNOSIS — I442 Atrioventricular block, complete: Secondary | ICD-10-CM

## 2022-10-10 LAB — CUP PACEART REMOTE DEVICE CHECK
Battery Voltage: 80
Date Time Interrogation Session: 20240510064251
Implantable Lead Connection Status: 753985
Implantable Lead Connection Status: 753985
Implantable Lead Implant Date: 20211227
Implantable Lead Implant Date: 20211227
Implantable Lead Location: 753859
Implantable Lead Location: 753860
Implantable Lead Model: 377
Implantable Lead Model: 377
Implantable Lead Serial Number: 8000018960
Implantable Lead Serial Number: 8000042643
Implantable Pulse Generator Implant Date: 20211227
Pulse Gen Model: 407145
Pulse Gen Serial Number: 69915927

## 2022-10-18 ENCOUNTER — Observation Stay (HOSPITAL_COMMUNITY)
Admission: EM | Admit: 2022-10-18 | Discharge: 2022-10-19 | Disposition: A | Discharge status: Home Health | Payer: No Typology Code available for payment source | Attending: Internal Medicine | Admitting: Internal Medicine

## 2022-10-18 ENCOUNTER — Emergency Department (HOSPITAL_COMMUNITY): Payer: No Typology Code available for payment source

## 2022-10-18 DIAGNOSIS — Z743 Need for continuous supervision: Secondary | ICD-10-CM | POA: Diagnosis not present

## 2022-10-18 DIAGNOSIS — R2681 Unsteadiness on feet: Secondary | ICD-10-CM | POA: Insufficient documentation

## 2022-10-18 DIAGNOSIS — F0150 Vascular dementia without behavioral disturbance: Secondary | ICD-10-CM | POA: Insufficient documentation

## 2022-10-18 DIAGNOSIS — R6889 Other general symptoms and signs: Secondary | ICD-10-CM | POA: Diagnosis not present

## 2022-10-18 DIAGNOSIS — R55 Syncope and collapse: Principal | ICD-10-CM | POA: Insufficient documentation

## 2022-10-18 DIAGNOSIS — R4182 Altered mental status, unspecified: Secondary | ICD-10-CM | POA: Diagnosis not present

## 2022-10-18 DIAGNOSIS — Z79899 Other long term (current) drug therapy: Secondary | ICD-10-CM | POA: Insufficient documentation

## 2022-10-18 DIAGNOSIS — I6782 Cerebral ischemia: Secondary | ICD-10-CM | POA: Diagnosis not present

## 2022-10-18 DIAGNOSIS — R29818 Other symptoms and signs involving the nervous system: Secondary | ICD-10-CM | POA: Diagnosis not present

## 2022-10-18 DIAGNOSIS — I10 Essential (primary) hypertension: Secondary | ICD-10-CM | POA: Insufficient documentation

## 2022-10-18 DIAGNOSIS — Z8673 Personal history of transient ischemic attack (TIA), and cerebral infarction without residual deficits: Secondary | ICD-10-CM | POA: Diagnosis not present

## 2022-10-18 DIAGNOSIS — Z7409 Other reduced mobility: Secondary | ICD-10-CM | POA: Diagnosis not present

## 2022-10-18 DIAGNOSIS — I959 Hypotension, unspecified: Secondary | ICD-10-CM | POA: Diagnosis not present

## 2022-10-18 DIAGNOSIS — Z950 Presence of cardiac pacemaker: Secondary | ICD-10-CM | POA: Diagnosis not present

## 2022-10-18 DIAGNOSIS — N179 Acute kidney failure, unspecified: Secondary | ICD-10-CM | POA: Diagnosis not present

## 2022-10-18 DIAGNOSIS — I749 Embolism and thrombosis of unspecified artery: Secondary | ICD-10-CM | POA: Insufficient documentation

## 2022-10-18 LAB — I-STAT CHEM 8, ED
BUN: 18 mg/dL (ref 8–23)
Calcium, Ion: 1.23 mmol/L (ref 1.15–1.40)
Chloride: 108 mmol/L (ref 98–111)
Creatinine, Ser: 1.4 mg/dL — ABNORMAL HIGH (ref 0.61–1.24)
Glucose, Bld: 101 mg/dL — ABNORMAL HIGH (ref 70–99)
HCT: 50 % (ref 39.0–52.0)
Hemoglobin: 17 g/dL (ref 13.0–17.0)
Potassium: 4 mmol/L (ref 3.5–5.1)
Sodium: 145 mmol/L (ref 135–145)
TCO2: 23 mmol/L (ref 22–32)

## 2022-10-18 LAB — CBC
HCT: 50.4 % (ref 39.0–52.0)
Hemoglobin: 16.4 g/dL (ref 13.0–17.0)
MCH: 30.7 pg (ref 26.0–34.0)
MCHC: 32.5 g/dL (ref 30.0–36.0)
MCV: 94.2 fL (ref 80.0–100.0)
Platelets: 141 10*3/uL — ABNORMAL LOW (ref 150–400)
RBC: 5.35 MIL/uL (ref 4.22–5.81)
RDW: 14.6 % (ref 11.5–15.5)
WBC: 6 10*3/uL (ref 4.0–10.5)
nRBC: 0 % (ref 0.0–0.2)

## 2022-10-18 LAB — COMPREHENSIVE METABOLIC PANEL
ALT: 15 U/L (ref 0–44)
AST: 22 U/L (ref 15–41)
Albumin: 3.8 g/dL (ref 3.5–5.0)
Alkaline Phosphatase: 71 U/L (ref 38–126)
Anion gap: 12 (ref 5–15)
BUN: 17 mg/dL (ref 8–23)
CO2: 19 mmol/L — ABNORMAL LOW (ref 22–32)
Calcium: 10.1 mg/dL (ref 8.9–10.3)
Chloride: 108 mmol/L (ref 98–111)
Creatinine, Ser: 1.48 mg/dL — ABNORMAL HIGH (ref 0.61–1.24)
GFR, Estimated: 47 mL/min — ABNORMAL LOW (ref >60)
Glucose, Bld: 103 mg/dL — ABNORMAL HIGH (ref 70–99)
Potassium: 4 mmol/L (ref 3.5–5.1)
Sodium: 139 mmol/L (ref 135–145)
Total Bilirubin: 0.5 mg/dL (ref 0.3–1.2)
Total Protein: 7.4 g/dL (ref 6.5–8.1)

## 2022-10-18 LAB — TROPONIN I (HIGH SENSITIVITY): Troponin I (High Sensitivity): 12 ng/L (ref <18)

## 2022-10-18 LAB — ETHANOL: Alcohol, Ethyl (B): <10 mg/dL (ref <10)

## 2022-10-18 LAB — CBG MONITORING, ED: Glucose-Capillary: 91 mg/dL (ref 70–99)

## 2022-10-18 LAB — DIFFERENTIAL
Abs Immature Granulocytes: 0.01 10*3/uL (ref 0.00–0.07)
Basophils Absolute: 0 10*3/uL (ref 0.0–0.1)
Basophils Relative: 1 %
Eosinophils Absolute: 0.1 10*3/uL (ref 0.0–0.5)
Eosinophils Relative: 2 %
Immature Granulocytes: 0 %
Lymphocytes Relative: 28 %
Lymphs Abs: 1.7 10*3/uL (ref 0.7–4.0)
Monocytes Absolute: 0.4 10*3/uL (ref 0.1–1.0)
Monocytes Relative: 7 %
Neutro Abs: 3.8 10*3/uL (ref 1.7–7.7)
Neutrophils Relative %: 62 %

## 2022-10-18 LAB — PROTIME-INR
INR: 1.1 (ref 0.8–1.2)
Prothrombin Time: 14.4 seconds (ref 11.4–15.2)

## 2022-10-18 LAB — APTT: aPTT: 26 seconds (ref 24–36)

## 2022-10-18 MED ORDER — SODIUM CHLORIDE 0.9% FLUSH
3.0000 mL | Freq: Once | INTRAVENOUS | Status: AC
  Administered 2022-10-18: 3 mL via INTRAVENOUS

## 2022-10-18 MED ORDER — LACTATED RINGERS IV SOLN: INTRAVENOUS | Status: DC

## 2022-10-18 MED ORDER — LACTATED RINGERS IV BOLUS
1000.0000 mL | Freq: Once | INTRAVENOUS | Status: AC
  Administered 2022-10-18: 1000 mL via INTRAVENOUS

## 2022-10-18 MED ORDER — SODIUM CHLORIDE 0.9% FLUSH
3.0000 mL | Freq: Two times a day (BID) | INTRAVENOUS | Status: DC
  Administered 2022-10-19: 3 mL via INTRAVENOUS

## 2022-10-18 MED ORDER — LATANOPROST 0.005 % OP SOLN
1.0000 [drp] | Freq: Every day | OPHTHALMIC | Status: DC
  Administered 2022-10-18: 1 [drp] via OPHTHALMIC
  Filled 2022-10-18: qty 2.5

## 2022-10-18 MED ORDER — BRIMONIDINE TARTRATE 0.2 % OP SOLN
1.0000 [drp] | Freq: Two times a day (BID) | OPHTHALMIC | Status: DC
  Administered 2022-10-18 – 2022-10-19 (×2): 1 [drp] via OPHTHALMIC
  Filled 2022-10-18: qty 5

## 2022-10-18 MED ORDER — RIVAROXABAN 10 MG PO TABS
10.0000 mg | ORAL_TABLET | Freq: Every day | ORAL | Status: DC
  Administered 2022-10-18: 10 mg via ORAL
  Filled 2022-10-18: qty 1

## 2022-10-18 NOTE — H&P (Signed)
Date: 10/18/2022               Patient Name:  Dustin Hamilton MRN: [id number]  DOB: Feb 23, 1941 Age / Sex: 82 y.o., male   [facility]P: [facility]         Medical Service: Internal Medicine Teaching Service         Attending Physician: Dr. 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  The patient does not remember much of the events of this morning, a significant portion of the history is obtained from his wife, Dustin Hamilton, who is in the room.  Dustin Hamilton remembers eating breakfast this morning and what he ate.  He then remembers possibly going outside to work in his garden as he does usually during the day.  He does remember anything after that point.  His wife adds that he was totally fine during breakfast and went to outside his usual to work in the garden.  She checked in on him after 30 minutes and he was fine at that time.  She then went back inside and worked on chores for about an hour before going out to check on him again.  It was at that time that she found him slumped forward sitting on his walker.  It did not seem like he fell or had any seizure-like activity, bowel or bladder incontinence.  She was not able to easily arouse him so she called EMS at approximately 1100 this morning.  EMS noted he was initially hypertensive, and then hypotensive on manual blood  pressure recheck and given light fluids.  He was initially GCS 7, but after multiple rechecks, he perked up.  He was alert and interactive and did not have any focal neurological deficits at the time he arrived in the ED.  The patient does not recall any chest pain, palpitations, dyspnea, lightheadedness or dizziness, headache, or loss of consciousness, fevers or chills.  He feels like he has had poor p.o. intake, and especially not been drinking much water.  Of note, his wife states that in the past few months, he has been having recurrent UTIs and that his urologist thought this was secondary to his BPH.  She says that he underwent a procedure that "shrunk his prostate", this might be a prostate artery embolization.  He has not had any dysuria, changes in urinary frequency in the past few days.  Of note, his pacemaker was interrogated during the time of our assessment.  It seems like he had a brief event where his ventricular rate was slightly higher than his atrial rate.  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  ED Course: Given 1 L LR fluids.  Assessed by neuroteam.  Code stroke called off  given lack of focal neurological deficits.  Meds:  (Full med rec unable to be done.  Patient does not manage his own medications, and we were not able to get a full med rec from his wife who is in the room) Allopurinol 100 mg daily Amlodipine 2.5 mg daily Dutasteride 0.5 mg daily, Flomax 0.4 mg nightly Toprol-XL 12.5 mg nightly Namenda 20 mg twice daily Bowel regimen Zanaflex 4 mg twice daily as needed  Past Medical History  Past Surgical History:  Procedure Laterality Date   CARDIOVASCULAR STRESS TEST  05/05/2007   normal nuclear study w/ no ischemia/  normal LV fucntion and wall motion , ef60%   COLONOSCOPY  last one 04-06-2012   CYSTO/  LEFT RETROGRADE PYELOGRAM/ CYTOLOGY WASHINGS/  URETEROSCOPY  03/05/2000    INGUINAL HERNIA REPAIR Bilateral 1965 and 1980's   IR 3D INDEPENDENT WKST  07/31/2022   IR ANGIOGRAM PELVIS SELECTIVE OR SUPRASELECTIVE  07/31/2022   IR ANGIOGRAM SELECTIVE EACH ADDITIONAL VESSEL  07/31/2022   IR ANGIOGRAM SELECTIVE EACH ADDITIONAL VESSEL  07/31/2022   IR ANGIOGRAM SELECTIVE EACH ADDITIONAL VESSEL  07/31/2022   IR ANGIOGRAM SELECTIVE EACH ADDITIONAL VESSEL  07/31/2022   IR EMBO TUMOR ORGAN ISCHEMIA INFARCT INC GUIDE ROADMAPPING  07/31/2022   IR RADIOLOGIST EVAL & MGMT  06/26/2022   IR RADIOLOGIST EVAL & MGMT  08/08/2022   IR RADIOLOGIST EVAL & MGMT  08/21/2022   IR US GUIDE VASC ACCESS RIGHT  07/31/2022   LAPAROSCOPIC INGUINAL HERNIA WITH UMBILICAL HERNIA Right 06/24/2007   LIVER BIOPSY  1980's and 2004   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   Procedure: PACEMAKER IMPLANT;  Surgeon: Marinus Maw, MD;  Location: [facility];  Service: Cardiovascular;  Laterality: N/A;   SVT ABLATION N/A 11/15/2018   Procedure: SVT ABLATION;  Surgeon: Marinus Maw, MD;  Location: [facility];  Service: Cardiovascular;  Laterality: N/A;   TRANSTHORACIC ECHOCARDIOGRAM  09/13/2012   moderate LVH,  ef 60-65%/     TRANSURETHRAL RESECTION OF BLADDER TUMOR N/A 08/11/2016   Procedure: TRANSURETHRAL RESECTION OF BLADDER TUMOR (TURBT);  Surgeon: Malen Gauze, MD;  Location: [facility];  Service: Urology;  Laterality: N/A;    Social:  Lives With: Wife and niece Occupation: Support: 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, 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 Level of Function: Ambulatory with walker, reliant on family for IADLs [facility] Substances: Has not smoked since 1984, has not drank alcohol since 1992  Family History:  Family History  Problem Relation Age of Onset   Rheum arthritis Mother    Diabetes Mother    Stroke Mother    Heart attack Mother    Kidney failure Mother    Heart attack Father    Heart disease Maternal Grandmother    Rheum arthritis Maternal Grandmother    Diabetes Maternal  Grandmother    Stroke Maternal Grandmother    Colon cancer Neg Hx      Allergies: Allergies as of 10/18/2022 - Reviewed 10/18/2022  Allergen Reaction Noted   Aricept [donepezil] Other (See Comments) 08/23/2020   Levetiracetam Other (See Comments) 04/16/2021   Penicillins Hives 04/07/2008   Viagra [sildenafil] Other (See Comments) 08/23/2020    Review of Systems: A complete ROS was negative except as per HPI.   OBJECTIVE:   Physical Exam: Blood pressure (!) 140/113, pulse 78, temperature (!) 97.4 F (36.3 C), temperature source Oral, resp. rate 17, weight 73 kg, SpO2 100 %.  Constitutional: NAD Cardiovascular: RRR, no murmurs,  rubs or gallops Pulmonary/Chest: normal work of breathing on room air, lungs clear to auscultation bilaterally Abdominal: soft, non-tender, non-distended Extremities: warm, well perfused, extremity pulses 2+, no BLE pitting edema Neuro: Alert and oriented x 2, PERRL, EOMI, cranial nerves grossly intact, 5/5 strength in all extremities, no sensory deficits, finger-nose testing intact  Labs:    Latest Ref Rng & Units 10/18/2022    2:14 PM 10/18/2022    2:08 PM 07/31/2022    8:00 AM 07/03/2022    5:01 PM 07/01/2022   10:57 PM 05/01/2022    6:38 PM 04/13/2022   10:10 AM  CBC EXTENDED  WBC 4.0 - 10.5 K/uL  6.0  5.6  6.4  4.5  8.0  5.0   RBC 4.22 - 5.81 MIL/uL  5.35  5.14  5.09  5.21  5.03  5.12   Hemoglobin 13.0 - 17.0 g/dL 16.1  09.6  04.5  40.9  16.4  16.1  16.3   HCT 39.0 - 52.0 % 50.0  50.4  49.0  48.4  49.2  49.0  47.9   Platelets 150 - 400 K/uL  141  135  144  122  168  143   NEUT# 1.7 - 7.7 K/uL  3.8  3.9  4.2  2.7   3.5   Lymph# 0.7 - 4.0 K/uL  1.7  1.1  1.5  1.3   1.1       Latest Ref Rng & Units 10/18/2022    2:14 PM 10/18/2022    2:08 PM 07/31/2022    8:00 AM 07/03/2022    5:01 PM 07/01/2022   10:57 PM 05/01/2022    6:38 PM 04/13/2022   10:10 AM  CMP  Glucose 70 - 99 mg/dL 811  914  782  956  213  121  124   BUN 8 - 23 mg/dL 18  17  14  18   15  21  22    Creatinine 0.61 - 1.24 mg/dL 0.86  5.78  4.69  6.29  0.87  1.02  1.15   Sodium 135 - 145 mmol/L 145  139  138  141  140  143  141   Potassium 3.5 - 5.1 mmol/L 4.0  4.0  4.1  3.8  3.7  3.7  3.7   Chloride 98 - 111 mmol/L 108  108  107  107  107  109  106   CO2 22 - 32 mmol/L  19  28  25  26  26  27    Calcium 8.9 - 10.3 mg/dL  52.8  9.5  9.6  9.8  9.8  9.9   Total Protein 6.5 - 8.1 g/dL  7.4  7.8    7.6  7.3   Total Bilirubin 0.3 - 1.2 mg/dL  0.5  0.6    0.5  0.4   Alkaline Phos 38 - 126 U/L  71  73    61  58   AST 15 - 41 U/L  22  20    18  16    ALT 0 - 44 U/L  15  14    18  14      Imaging: CXR unremarkable CT head: No hemorrhage or fracture, chronic cerebral atrophy and microvascular ischemic changes with ex vacuo hydrocephalus  EKG: personally reviewed my interpretation is pacemaker in place.  Some PVCs on telemetry  ASSESSMENT & PLAN:   Assessment & Plan by Problem: Principal Problem:   Syncope   SAYYID TRNKA is a  82 y.o. 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  Unclear how long he had been in that state, but at most 1 hour given his wife had been checking on him.  He has no memory of the situation.  I think he is currently at his neurological baseline.  Do not think he had stroke or seizures.  He did possibly have abnormality recorded on his pacemaker or ventricular rate was higher than atrial rate.  Unsure of the significance of of this as the exact rhythm strip was not recorded given the pacemaker was placed for second-degree AV block and sinus sick syndrome, could possibly be nonsustained V. tach, but his  device is functional at this point.  Highest suspicion is likely for orthostatic syncope secondary to dehydration.  He has already received some fluids in the ED, will continue overnight.  He had negative orthostatics done after his liter bolus in the ED.  Of note, he is also on dutasteride and Flomax in addition to Zanaflex, which can all make him orthostatic or are centrally acting. - Continue telemetry - LR 100 cc/hour for 10 hours -Follow-up UA, UDS - Hold dutasteride, Flomax, antihypertensives, and Zanaflex for now  # AKI Presents with creatinine 1.48 from baseline approximately 1.  Likely prerenal in the setting of poor p.o. intake and dehydration. 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  As above, possible run of nonsustained V. tach, will continue telemetry monitoring - Continue telemetry  #BPH status post prostate artery embolization - Holding Flomax, dutasteride in the setting of possible orthostatic syncope.  Can consider restarting at discharge or if he becomes symptomatic with urinary retention  #HTN Holding antihypertensives for now  Diet: Normal VTE:  Xarelto IVF: LR,100cc/hr Code: Full  Prior to Admission Living Arrangement: Home, living with wife and niece Anticipated Discharge Location:  Pending PT evaluation Barriers to Discharge:   Dispo: Admit patient to Observation with expected length of stay less than 2 midnights.  Signed: Lyndle Herrlich, MD Internal Medicine Resident PGY-1  10/18/2022, 6:04 PM

## 2022-10-18 NOTE — Hospital Course (Signed)
Syncopal episode?  In garden in early afternoon Wife found him unresponsive on walker  WMS said awake but not following commands Remained confused in ED.  Transient hypotension, not orthostatic  Hx of complete  Should come interogate  Could be sz, getting spot EEG

## 2022-10-18 NOTE — Progress Notes (Signed)
NEW ADMISSION NOTE New Admission Note:   Arrival Method: Patient arrived in stretcher accompanied by wife. Mental Orientation: alert and oreinted x 3. Telemetry: 5M20  Assessment: in progress Skin:WDL IV: Ledt UA LR 100 ml/hr Pain: denies any pain. Tubes:N/A Safety Measures: Safety Fall Prevention Plan has been given, discussed and signed Admission: Completed [facility] Orientation: Patient has been orientated to the room, unit and staff.  Family:  Orders have been reviewed and implemented. Will continue to monitor the patient. Call light has been placed within reach and bed alarm has been activated.   Arvilla Meres, RN

## 2022-10-18 NOTE — Procedures (Addendum)
Patient Name: Dustin Hamilton  MRN: [id number]  Epilepsy Attending: Charlsie Quest  Referring Physician/Provider: Rejeana Brock, MD  Date: 10/19/2022 Duration: 22.47 mins  Patient history:  82 y.o. male with a past medical history of vascular dementia, CVA, BPH and heart disease presenting after being found unresponsive in his garden by his wife. EEG to evaluate for seizure.   Level of alertness: Awake  AEDs during EEG study: None  Technical aspects: This EEG study was done with scalp electrodes positioned according to the 10-20 International system of electrode placement. Electrical activity was reviewed with band pass filter of 1-70Hz , sensitivity of 7 uV/mm, display speed of 34mm/sec with a 60Hz  notched filter applied as appropriate. EEG data were recorded continuously and digitally stored.  Video monitoring was available and reviewed as appropriate.  Description: The posterior dominant rhythm consists of 9 Hz activity of moderate voltage (25-35 uV) seen predominantly in posterior head regions, symmetric and reactive to eye opening and eye closing. Hyperventilation and photic stimulation were not performed.     IMPRESSION: This study is within normal limits. No seizures or epileptiform discharges were seen throughout the recording.  A normal interictal EEG does not exclude  the diagnosis of epilepsy.  [name] [name]

## 2022-10-18 NOTE — ED Provider Notes (Addendum)
[place] EMERGENCY DEPARTMENT AT [facility] Provider Note   CSN: [id number] Arrival date & time: 10/18/22  1406     History  Chief Complaint  Patient presents with   Code Stroke    Dustin Hamilton is a 82 y.o. male.  HPI 82 year old male presents as a code stroke.  History initially from EMS.  The patient was last seen normal around 11 AM by wife.  Found unresponsive in the garden.  He is often in the garden each day.  EMS arrived and his eyes were open but he was not following commands and was estimated to have a GCS of 7.  And route here he all of a sudden seem to become more responsive and was asking questions such as where he was.  He is now following commands and moving all extremities.  He never had dysarthria but was rather just not answering.  Initial BP was around 180/100 per EMS but when they did a manual just prior to arriving it was around 70 systolic and they have given about 100 mL of fluid. He has a history of Dementia, which limits the history from the patient.  Home Medications Prior to Admission medications   Medication Sig Start Date End Date Taking? Authorizing Provider  acetaminophen (TYLENOL) 325 MG tablet Take 2 tablets (650 mg total) by mouth every 6 (six) hours as needed for moderate pain. 02/03/22   Wallis Bamberg, PA-C  allopurinol (ZYLOPRIM) 100 MG tablet Take 1 tablet (100 mg total) by mouth daily. 04/05/21 07/31/22  Pokhrel, Rebekah Chesterfield, MD  amLODipine (NORVASC) 2.5 MG tablet Take 2.5 mg by mouth daily.    [provider]  brimonidine (ALPHAGAN) 0.2 % ophthalmic solution Place 1 drop into both eyes 2 (two) times daily.    [provider]  diclofenac Sodium (VOLTAREN) 1 % GEL Apply 4 g topically 4 (four) times daily. Patient taking differently: Apply 4 g topically daily as needed (to affected sites- for pain). 06/05/21   Melene Plan, DO  docusate sodium (COLACE) 100 MG capsule Take 1 capsule (100 mg total) by mouth 2 (two) times daily.  07/31/22   Kennieth Francois, PA  dutasteride (AVODART) 0.5 MG capsule Take 0.5 mg by mouth daily.    [provider]  ibuprofen (ADVIL) 800 MG tablet Take 1 tablet (800 mg total) by mouth every 8 (eight) hours as needed. 07/31/22   Kennieth Francois, PA  latanoprost (XALATAN) 0.005 % ophthalmic solution Place 1 drop into both eyes at bedtime.     [provider]  lidocaine (LIDODERM) 5 % Place 1 patch onto the skin daily as needed (back pain). Remove & Discard patch within 12 hours or as directed by MD    [provider]  memantine (NAMENDA) 10 MG tablet Take 20 mg by mouth in the morning and at bedtime.    [provider]  metoprolol succinate (TOPROL-XL) 25 MG 24 hr tablet Take 0.5 tablets (12.5 mg total) by mouth at bedtime. 05/31/20   Lilland, Alana, DO  Naphazoline HCl (CLEAR EYES OP) Place 2 drops into both eyes daily as needed (dry eyes).    [provider]  Nutritional Supplements (ENSURE ACTIVE HEART HEALTH PO) Take 237 mLs by mouth daily as needed (appetite, nutrition). Clear/apple    [provider]  polyethylene glycol (MIRALAX / GLYCOLAX) 17 g packet Take 17 g by mouth daily. Patient taking differently: Take 17 g by mouth daily as needed for mild constipation. 05/01/22  Charlynne Pander, MD  tamsulosin (FLOMAX) 0.4 MG CAPS capsule Take 0.4 mg by mouth at bedtime.    [provider]  tiZANidine (ZANAFLEX) 4 MG tablet Take 4 mg by mouth 2 (two) times daily as needed for muscle spasms. 06/11/21   [provider]      Allergies    Aricept [donepezil], Levetiracetam, Penicillins, and Viagra [sildenafil]    Review of Systems   Review of Systems  Unable to perform ROS: Dementia    Physical Exam Updated Vital Signs BP (!) 140/113   Pulse 78   Temp (!) 97.4 F (36.3 C) (Oral)   Resp 17   Wt 73 kg   SpO2 100%   BMI 22.45 kg/m  Physical Exam Vitals and nursing note reviewed.  Constitutional:      General: He  is not in acute distress.    Appearance: He is well-developed. He is not ill-appearing or diaphoretic.  HENT:     Head: Normocephalic and atraumatic.  Eyes:     Extraocular Movements: Extraocular movements intact.     Pupils: Pupils are equal, round, and reactive to light.  Cardiovascular:     Rate and Rhythm: Regular rhythm. Tachycardia present.     Heart sounds: Normal heart sounds.  Pulmonary:     Effort: Pulmonary effort is normal.     Breath sounds: Normal breath sounds.  Abdominal:     General: There is no distension.     Palpations: Abdomen is soft.     Tenderness: There is no abdominal tenderness.  Skin:    General: Skin is warm and dry.  Neurological:     Mental Status: He is alert. He is disoriented.     Comments: CN 3-12 grossly intact. 5/5 strength in all 4 extremities. Grossly normal sensation. Normal finger to nose.      ED Results / Procedures / Treatments   Labs (all labs ordered are listed, but only abnormal results are displayed) Labs Reviewed  CBC - Abnormal; Notable for the following components:      Result Value   Platelets 141 (*)    All other components within normal limits  COMPREHENSIVE METABOLIC PANEL - Abnormal; Notable for the following components:   CO2 19 (*)    Glucose, Bld 103 (*)    Creatinine, Ser 1.48 (*)    GFR, Estimated 47 (*)    All other components within normal limits  I-STAT CHEM 8, ED - Abnormal; Notable for the following components:   Creatinine, Ser 1.40 (*)    Glucose, Bld 101 (*)    All other components within normal limits  PROTIME-INR  APTT  DIFFERENTIAL  ETHANOL  URINALYSIS, W/ REFLEX TO CULTURE (INFECTION SUSPECTED)  RAPID URINE DRUG SCREEN, HOSP PERFORMED  CBG MONITORING, ED  TROPONIN I (HIGH SENSITIVITY)    EKG EKG Interpretation  Date/Time:  Saturday Oct 18 2022 14:28:18 EDT Ventricular Rate:  106 PR Interval:    QRS Duration: 115 QT Interval:  339 QTC Calculation: 451 R Axis:   -56 Text  Interpretation: Junctional tachycardia Left anterior fascicular block Incomplete left bundle branch block Anterior infarct, old Confirmed by Vonita Moss [phone]) on 10/18/2022 3:09:47 PM  Radiology DG Chest Portable 1 View  Result Date: 10/18/2022 CLINICAL DATA:  Altered level of consciousness EXAM: PORTABLE CHEST 1 VIEW COMPARISON:  Through/2/24 FINDINGS: Single frontal view of the chest demonstrates stable dual lead pacemaker. Cardiac silhouette is unremarkable. Chronic elevation right hemidiaphragm. No airspace disease, effusion, or pneumothorax. No  acute bony abnormality. IMPRESSION: 1. Stable chest, no acute process. Electronically Signed   By: Sharlet Salina M.D.   On: 10/18/2022 14:34   CT HEAD CODE STROKE WO CONTRAST  Result Date: 10/18/2022 CLINICAL DATA:  Code stroke.  Neuro deficit, acute, stroke suspected EXAM: CT HEAD WITHOUT CONTRAST TECHNIQUE: Contiguous axial images were obtained from the base of the skull through the vertex without intravenous contrast. RADIATION DOSE REDUCTION: This exam was performed according to the departmental dose-optimization program which includes automated exposure control, adjustment of the mA and/or kV according to patient size and/or use of iterative reconstruction technique. COMPARISON:  CT head 07/02/2022. FINDINGS: Brain: No evidence of acute large vascular territory infarct, acute hemorrhage, mass lesion, midline shift or hydrocephalus. Similar cerebral atrophy with ex vacuo ventricular dilation. Patchy white matter hypodensities, nonspecific but compatible with chronic microvascular ischemic change. Vascular: Calcific atherosclerosis.  No hyperdense vessel. Skull: No acute fractures. Sinuses/Orbits: Clear sinuses particular findings. Other: No mastoid effusions. ASPECTS [facility] Stroke Program Early CT Score) total score (0-10 with 10 being normal): 10. IMPRESSION: 1. No evidence of acute large vascular territory infarct or acute hemorrhage. ASPECTS is  10. 2. Chronic microvascular ischemic change and cerebral atrophy (ICD10-G31.9). Code stroke imaging results were communicated on 10/18/2022 at 2:23 pm to provider Dr. Amada Jupiter via secure text paging. Electronically Signed   By: Feliberto Harts M.D.   On: 10/18/2022 14:23    Procedures Procedures    Medications Ordered in ED Medications  sodium chloride flush (NS) 0.9 % injection 3 mL (3 mLs Intravenous Given 10/18/22 1429)  lactated ringers bolus 1,000 mL (1,000 mLs Intravenous New Bag/Given 10/18/22 1428)    ED Course/ Medical Decision Making/ A&P                            Medical Decision Making Amount and/or Complexity of Data Reviewed Labs: ordered.    Details: Mild bump in creatinine from baseline of around 1 to 1.48.  Other labs overall unremarkable. Radiology: ordered and independent interpretation performed.    Details: No bleed on CT.  No pneumonia on chest x-ray ECG/medicine tests: ordered and independent interpretation performed.    Details: Tachycardia but no ischemia.  Risk Decision regarding hospitalization.   Wife arrived after initial workup and noted that when she found him outside where it was hot and muggy he was sitting on his walker and was unresponsive with his head bent over and some drooling out of his mouth/nose.  He now seems to be back to his baseline.  We discussed options and given he has a mild AKI I think he likely has some dehydration and syncope related to that.  However after discussion with Dr. Amada Jupiter (who canceled code stroke as are no localizing symptoms), it would be reasonable to do EEG for possible seizure.  His pacemaker will be interrogated but we need the [employer] rep. Will admit, discussed with internal medicine teaching service.         Final Clinical Impression(s) / ED Diagnoses Final diagnoses:  Syncope, unspecified syncope type  Acute kidney injury [facility])    Rx / DC Orders ED Discharge Orders     None          Pricilla Loveless, MD 10/18/22 1558    Pricilla Loveless, MD 10/18/22 [phone]

## 2022-10-18 NOTE — Plan of Care (Signed)
  Problem: Education: Goal: Knowledge of condition and prescribed therapy will improve Outcome: Progressing   Problem: Cardiac: Goal: Will achieve and/or maintain adequate cardiac output Outcome: Progressing   Problem: Physical Regulation: Goal: Complications related to the disease process, condition or treatment will be avoided or minimized Outcome: Progressing   

## 2022-10-18 NOTE — Consult Note (Signed)
Neurology Consultation  Reason for Consult: Code Stroke Referring Physician: Altered mental status  CC: Unresponsive History is obtained from: EMS  HPI: Dustin Hamilton is a 82 y.o. male with a past medical history of vascular dementia, CVA, BPH and heart disease presenting after being found unresponsive in his garden by his wife.  Last known well was 11 AM when his wife saw him go outside to work in the garden.  She found him outside unresponsive and called EMS.  In route to the hospital he became more responsive, initially his blood pressure was 180/100, however when they retook it manually it was systolic 70s.  EMS states that he was asking questions such as " where are we" but would not follow commands.  Glucose 91.  Repeat repeat blood pressures systolic 80s.  When he is lying down flat his blood pressure was systolic 120s.  He did get an LR bolus in the ED.  Creatinine elevated at 1.4, hemoglobin 17.  On chart review he has had positive orthostatic vitals documented in November 2023.  He is currently on Namenda.  He is able to tell us that he has had fainting episodes in the past.  He does not remember the episode today.   LKW: 1100 TNK given?: No stroke not suspected Premorbid modified Rankin scale (mRS): 1   ROS: Full ROS was performed and is negative except as noted in the HPI.   Past Medical History:  Diagnosis Date   BPH (benign prostatic hyperplasia)    Cerebrovascular disease    Clostridium difficile diarrhea    Congenital anomaly of diaphragm    Elevated PSA    Glaucoma, both eyes    Hemorrhoid    Hepatitis B surface antigen positive    02-20-2011   History of adenomatous polyp of colon    2007, 2009 and 2013  tubular adenoma's   History of alcohol abuse    quit 1963   History of cerebral parenchymal hemorrhage    01/ 2006  left occiptial lobe related to hypertensive crisis   History of CVA (cerebrovascular accident)    09-12-2012  left hippocampus/ amygdala  junction and per MRI old white matter infarcts--  per pt residual short- term memory issues   History of fatty infiltration of liver hx visit's at [facility] , last visit 05/ 2014   elvated LFT's ,  via liver bx 2004 related to hx alcohol and drug abuse (quit 1964)   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   HTN (hypertension)    Renal cyst, left    Stroke (HCC)    hx of 3 strokes in past    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 His smoking use included cigarettes. He has a 5.00 pack-year smoking history. He has never used smokeless tobacco.  He reports that he does not drink alcohol and does not use drugs.  Medications  Current Facility-Administered Medications:    sodium chloride flush (NS) 0.9 % injection 3 mL, 3 mL, Intravenous, Once, Pricilla Loveless, MD  Current Outpatient Medications:    acetaminophen (TYLENOL) 325 MG tablet, Take 2 tablets (650 mg total) by mouth every 6 (six) hours as needed for moderate pain., Disp: 30 tablet, Rfl: 0   allopurinol (ZYLOPRIM) 100 MG tablet, Take 1 tablet (100 mg total) by mouth daily., Disp: 30 tablet, Rfl: 2   amLODipine (NORVASC) 2.5 MG tablet, Take 2.5 mg by mouth daily., Disp: , Rfl:    brimonidine (ALPHAGAN) 0.2 % ophthalmic solution, Place 1 drop into both eyes 2 (two) times daily., Disp: , Rfl:    diclofenac Sodium (VOLTAREN) 1 % GEL, Apply 4 g topically 4 (four) times daily. (Patient taking differently: Apply 4 g  topically daily as needed (to affected sites- for pain).), Disp: 100 g, Rfl: 0   docusate sodium (COLACE) 100 MG capsule, Take 1 capsule (100 mg total) by mouth 2 (two) times daily., Disp: 14 capsule, Rfl: 0   dutasteride (AVODART) 0.5 MG capsule, Take 0.5 mg by mouth daily., Disp: , Rfl:    ibuprofen (ADVIL) 800 MG tablet, Take 1 tablet (800 mg total) by mouth every 8 (eight) hours as needed., Disp: 30 tablet, Rfl: 0   latanoprost (XALATAN) 0.005 % ophthalmic solution, Place 1 drop into both eyes at bedtime. , Disp: , Rfl:    lidocaine (LIDODERM) 5 %, Place 1 patch onto the skin daily as needed (back pain). Remove & Discard patch within 12 hours or as directed by MD, Disp: , Rfl:    memantine (NAMENDA) 10 MG tablet, Take 20 mg by mouth in the morning and at bedtime., Disp: , Rfl:    metoprolol succinate (TOPROL-XL) 25 MG 24 hr tablet, Take 0.5 tablets (12.5 mg total) by mouth at bedtime., Disp: 30 tablet, Rfl: 1   Naphazoline HCl (CLEAR EYES OP), Place 2 drops into both eyes daily as needed (dry eyes)., Disp: , Rfl:    Nutritional Supplements (ENSURE ACTIVE HEART HEALTH PO), Take 237 mLs by mouth daily as needed (appetite, nutrition). Clear/apple, Disp: , Rfl:    polyethylene glycol (MIRALAX / GLYCOLAX) 17 g packet, Take 17 g by mouth daily. (Patient taking differently: Take 17 g by mouth daily as needed for mild constipation.), Disp: 14 each, Rfl: 0   tamsulosin (FLOMAX) 0.4 MG CAPS capsule, Take 0.4 mg by mouth at bedtime., Disp: , Rfl:    tiZANidine (ZANAFLEX) 4 MG tablet, Take 4 mg by mouth 2 (two) times daily as needed for muscle spasms., Disp: , Rfl:    Exam: Current vital signs: There were no vitals taken for this visit. Vital signs in last 24 hours: BP: ()/()  Arterial Line BP: ()/()   GENERAL: Awake, alert in NAD HEENT: - Normocephalic and atraumatic, dry mm, no LN++, no Thyromegally LUNGS - Clear to auscultation bilaterally with no wheezes CV - S1S2 RRR, no m/r/g, equal pulses  bilaterally. ABDOMEN - Soft, nontender, nondistended with normoactive BS Ext: warm, well perfused, intact peripheral pulses, no edema  NEURO:  Mental Status: Alert to self, unable to tell me the date or where we are, states he is 55 Language: speech is clear.  Naming, repetition, fluency, and comprehension intact. Cranial Nerves: PERRL . EOMI, visual fields full, symmetric smile at baseline, facial sensation intact, hearing intact, tongue/uvula/soft palate midline, normal sternocleidomastoid and  trapezius muscle strength. No evidence of tongue atrophy or fasciculations Motor:  Tone: is normal and bulk is normal Sensation- Intact to light touch bilaterally Coordination: FTN intact bilaterally, no ataxia in BLE. Gait- deferred    Labs I have reviewed labs in epic and the results pertinent to this consultation are:   CBC    Component Value Date/Time   WBC 5.6 07/31/2022 0800   RBC 5.14 07/31/2022 0800   HGB 16.2 07/31/2022 0800   HGB 17.2 11/12/2018 0950   HCT 49.0 07/31/2022 0800   HCT 52.0 (H) 11/12/2018 0950   PLT 135 (L) 07/31/2022 0800   PLT 144 (L) 11/12/2018 0950   MCV 95.3 07/31/2022 0800   MCV 92 11/12/2018 0950   MCH 31.5 07/31/2022 0800   MCHC 33.1 07/31/2022 0800   RDW 13.9 07/31/2022 0800   RDW 13.7 11/12/2018 0950   LYMPHSABS 1.1 07/31/2022 0800   LYMPHSABS 1.3 11/12/2018 0950   MONOABS 0.4 07/31/2022 0800   EOSABS 0.1 07/31/2022 0800   EOSABS 0.1 11/12/2018 0950   BASOSABS 0.0 07/31/2022 0800   BASOSABS 0.0 11/12/2018 0950    CMP     Component Value Date/Time   NA 138 07/31/2022 0800   NA 143 11/12/2018 0950   K 4.1 07/31/2022 0800   CL 107 07/31/2022 0800   CO2 28 07/31/2022 0800   GLUCOSE 106 (H) 07/31/2022 0800   BUN 14 07/31/2022 0800   BUN 15 11/12/2018 0950   CREATININE 1.04 07/31/2022 0800   CALCIUM 9.5 07/31/2022 0800   PROT 7.8 07/31/2022 0800   ALBUMIN 4.1 07/31/2022 0800   AST 20 07/31/2022 0800   ALT 14 07/31/2022 0800   ALKPHOS  73 07/31/2022 0800   BILITOT 0.6 07/31/2022 0800   GFRNONAA >60 07/31/2022 0800   GFRAA 60 (L) 02/28/2020 1152    Lipid Panel     Component Value Date/Time   CHOL 130 09/05/2021 0109   TRIG 73 09/05/2021 0109   HDL 28 (L) 09/05/2021 0109   CHOLHDL 4.6 09/05/2021 0109   VLDL 15 09/05/2021 0109   LDLCALC 87 09/05/2021 0109     Imaging I have reviewed the images obtained:  CT-head 1. No evidence of acute large vascular territory infarct or acute hemorrhage. ASPECTS is 10. 2. Chronic microvascular ischemic change and cerebral atrophy  Assessment:  82 y.o. male with past medical history of vascular dementia, CVA, BPH and heart disease presenting after being found unresponsive in his garden by his wife.  In route to the hospital he became more responsive, initially his blood pressure was 180/100, however when they retook it manually it was systolic 70s.  EMS states that he was asking questions such as " where are we" but would not follow commands.  Glucose 91.  Repeat repeat blood pressures systolic 80s.  When he is lying down flat his blood pressure was systolic 120s. Creatinine elevated at 1.4, hemoglobin 17.  On chart review he has had positive orthostatic vitals documented in November 2023.  He is able to tell us that he has had fainting episodes in the past.  He does not remember the episode today. Given presentation, symptoms, and history this episode is more consistent with a syncopal episode or an orthostatic episode.  Recommendations: -Orthostatic vitals -EKG -EEG   Patient seen and examined by NP/APP with MD. MD to update note as needed.   Elmer Picker, DNP, FNP-BC [facility] Pager: [phone]  I have seen the patient reviewed the above note.  He had an unresponsive episode, given the fluctuations in his blood pressure, I strongly suspect this is the etiology.  Though he was slightly confused afterwards, this is something that I do see from time to time in  folks with dementia even in the setting of cardiac/orthostatic syncope.  I do think an EEG would be reasonable, but if this is negative I would favor treating this as being related to hypoperfusion due to blood pressure.  Neurology will follow-up as EEG, but if this is negative no further neurologic workup needed at this time.  Ritta Slot, MD [facility] [phone]  If 7pm- 7am, please page neurology on call as listed in AMION.

## 2022-10-18 NOTE — ED Notes (Signed)
Cleaned pt. Replaced depends and sheets.

## 2022-10-18 NOTE — ED Notes (Signed)
ED TO INPATIENT HANDOFF REPORT  ED Nurse Name and Phone #: [phone], Lendon Ka Name/Age/Gender Dustin Hamilton 82 y.o. male Room/Bed: TRAAC/TRAAC  Code Status   Code Status: Full Code  Home/SNF/Other Home Patient oriented to: self, place, time, and situation Is this baseline? Yes   Triage Complete: Triage complete  Chief Complaint Syncope [R55]  Triage Note No notes on file   Allergies Allergies  Allergen Reactions   Aricept [Donepezil] Other (See Comments)    Dizziness    Levetiracetam Other (See Comments)    AMS   Penicillins Hives   Viagra [Sildenafil] Other (See Comments)    Dizziness     Level of Care/Admitting Diagnosis ED Disposition     ED Disposition  Admit   Condition  --   Comment  Hospital Area: [facility] [[id number]]  Level of Care: Telemetry Medical [104]  May place patient in observation at [facility] or [facility] if equivalent level of care is available:: No  Covid Evaluation: Asymptomatic - no recent exposure (last 10 days) testing not required  Diagnosis: Syncope [206001]  Admitting Physician: Dickie La [[id number]]  Attending Physician: Dickie La [[id number]]          B Medical/Surgery History Past Medical History:  Diagnosis Date   BPH (benign prostatic hyperplasia)    Cerebrovascular disease    Clostridium difficile diarrhea    Congenital anomaly of diaphragm    Elevated PSA    Glaucoma, both eyes    Hemorrhoid    Hepatitis B surface antigen positive    02-20-2011   History of adenomatous polyp of colon    2007, 2009 and 2013  tubular adenoma's   History of alcohol abuse    quit 1963   History of cerebral parenchymal hemorrhage    01/ 2006  left occiptial lobe related to hypertensive crisis   History of CVA (cerebrovascular accident)    09-12-2012  left hippocampus/ amygdala junction and per MRI old white matter infarcts--  per pt residual short- term memory issues   History of fatty infiltration  of liver hx visit's at [facility] , last visit 05/ 2014   elvated LFT's ,  via liver bx 2004 related to hx alcohol and drug abuse (quit 1964)   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   HTN (hypertension)    Renal cyst, left    Stroke (HCC)    hx of 3 strokes in past    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    Urethral lesion    urethral mass   Past Surgical History:  Procedure Laterality Date   CARDIOVASCULAR STRESS TEST  05/05/2007   normal nuclear study w/ no ischemia/  normal LV fucntion and wall motion , ef60%   COLONOSCOPY  last one 04-06-2012   CYSTO/  LEFT RETROGRADE PYELOGRAM/ CYTOLOGY WASHINGS/  URETEROSCOPY  03/05/2000   INGUINAL HERNIA REPAIR Bilateral 1965 and 1980's   IR 3D INDEPENDENT WKST  07/31/2022   IR ANGIOGRAM PELVIS SELECTIVE OR SUPRASELECTIVE  07/31/2022   IR ANGIOGRAM SELECTIVE EACH ADDITIONAL VESSEL  07/31/2022   IR ANGIOGRAM SELECTIVE EACH ADDITIONAL VESSEL  07/31/2022   IR ANGIOGRAM SELECTIVE EACH ADDITIONAL VESSEL  07/31/2022   IR ANGIOGRAM SELECTIVE EACH ADDITIONAL VESSEL  07/31/2022   IR EMBO TUMOR ORGAN ISCHEMIA INFARCT INC GUIDE ROADMAPPING  07/31/2022   IR RADIOLOGIST EVAL & MGMT  06/26/2022   IR  RADIOLOGIST EVAL & MGMT  08/08/2022   IR RADIOLOGIST EVAL & MGMT  08/21/2022   IR US GUIDE VASC ACCESS RIGHT  07/31/2022   LAPAROSCOPIC INGUINAL HERNIA WITH UMBILICAL HERNIA Right 06/24/2007   LIVER BIOPSY  1980's and 2004   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   Procedure: PACEMAKER IMPLANT;  Surgeon: Marinus Maw, MD;  Location: [facility];  Service: Cardiovascular;  Laterality: N/A;   SVT ABLATION N/A 11/15/2018   Procedure: SVT ABLATION;  Surgeon: Marinus Maw, MD;  Location: [facility];  Service: Cardiovascular;  Laterality: N/A;   TRANSTHORACIC ECHOCARDIOGRAM  09/13/2012   moderate LVH,  ef 60-65%/     TRANSURETHRAL RESECTION OF BLADDER TUMOR N/A 08/11/2016   Procedure:  TRANSURETHRAL RESECTION OF BLADDER TUMOR (TURBT);  Surgeon: Malen Gauze, MD;  Location: [facility];  Service: Urology;  Laterality: N/A;     A IV Location/Drains/Wounds Patient Lines/Drains/Airways Status     Active Line/Drains/Airways     Name Placement date Placement time Site Days   Peripheral IV 10/18/22 20 G Anterior;Distal;Left;Upper Arm 10/18/22  --  Arm  less than 1            Intake/Output Last 24 hours No intake or output data in the 24 hours ending 10/18/22 1712  Labs/Imaging Results for orders placed or performed during the hospital encounter of 10/18/22 (from the past 48 hour(s))  Protime-INR     Status: None   Collection Time: 10/18/22  2:08 PM  Result Value Ref Range   Prothrombin Time 14.4 11.4 - 15.2 seconds   INR 1.1 0.8 - 1.2    Comment: (NOTE) INR goal varies based on device and disease states. Performed at [facility], [address], [place], [place] [address]   APTT     Status: None   Collection Time: 10/18/22  2:08 PM  Result Value Ref Range   aPTT 26 24 - 36 seconds    Comment: Performed at [facility], [address], [place], [place] [address]  CBC     Status: Abnormal   Collection Time: 10/18/22  2:08 PM  Result Value Ref Range   WBC 6.0 4.0 - 10.5 K/uL   RBC 5.35 4.22 - 5.81 MIL/uL   Hemoglobin 16.4 13.0 - 17.0 g/dL   HCT 56.2 13.0 - 86.5 %   MCV 94.2 80.0 - 100.0 fL   MCH 30.7 26.0 - 34.0 pg   MCHC 32.5 30.0 - 36.0 g/dL   RDW 78.4 69.6 - 29.5 %   Platelets 141 (L) 150 - 400 K/uL   nRBC 0.0 0.0 - 0.2 %    Comment: Performed at [facility], [address], [place], [place] [address]  Differential     Status: None   Collection Time: 10/18/22  2:08 PM  Result Value Ref Range   Neutrophils Relative % 62 %   Neutro Abs 3.8 1.7 - 7.7 K/uL   Lymphocytes Relative 28 %   Lymphs Abs 1.7 0.7 - 4.0 K/uL   Monocytes Relative 7 %   Monocytes Absolute 0.4 0.1 - 1.0 K/uL   Eosinophils Relative 2 %    Eosinophils Absolute 0.1 0.0 - 0.5 K/uL   Basophils Relative 1 %   Basophils Absolute 0.0 0.0 - 0.1 K/uL   Immature Granulocytes 0 %   Abs Immature Granulocytes 0.01 0.00 - 0.07 K/uL    Comment: Performed at [facility], [address] [place], [place] [address]  Comprehensive metabolic panel     Status: Abnormal   Collection Time: 10/18/22  2:08 PM  Result Value Ref Range   Sodium 139 135 - 145 mmol/L   Potassium 4.0 3.5 - 5.1 mmol/L   Chloride 108 98 - 111 mmol/L   CO2 19 (L) 22 - 32 mmol/L   Glucose, Bld 103 (H) 70 - 99 mg/dL    Comment: Glucose reference range applies only to samples taken after fasting for at least 8 hours.   BUN 17 8 - 23 mg/dL   Creatinine, Ser 1.02 (H) 0.61 - 1.24 mg/dL   Calcium 72.5 8.9 - 36.6 mg/dL   Total Protein 7.4 6.5 - 8.1 g/dL   Albumin 3.8 3.5 - 5.0 g/dL   AST 22 15 - 41 U/L   ALT 15 0 - 44 U/L   Alkaline Phosphatase 71 38 - 126 U/L   Total Bilirubin 0.5 0.3 - 1.2 mg/dL   GFR, Estimated 47 (L) >60 mL/min    Comment: (NOTE) Calculated using the CKD-EPI Creatinine Equation (2021)    Anion gap 12 5 - 15    Comment: Performed at [facility], [address], [place], [place] [address]  Ethanol     Status: None   Collection Time: 10/18/22  2:08 PM  Result Value Ref Range   Alcohol, Ethyl (B) <10 <10 mg/dL    Comment: (NOTE) Lowest detectable limit for serum alcohol is 10 mg/dL.  For medical purposes only. Performed at [facility], [address] [place], [place] [address]   Troponin I (High Sensitivity)     Status: None   Collection Time: 10/18/22  2:08 PM  Result Value Ref Range   Troponin I (High Sensitivity) 12 <18 ng/L    Comment: (NOTE) Elevated high sensitivity troponin I (hsTnI) values and significant  changes across serial measurements may suggest ACS but many other  chronic and acute conditions are known to elevate hsTnI results.  Refer to the "Links" section for chest pain algorithms and additional   guidance. Performed at [facility], [address], [place], [place] [address]   CBG monitoring, ED     Status: None   Collection Time: 10/18/22  2:09 PM  Result Value Ref Range   Glucose-Capillary 91 70 - 99 mg/dL    Comment: Glucose reference range applies only to samples taken after fasting for at least 8 hours.   Comment 1 Notify RN    Comment 2 Document in Chart   I-stat chem 8, ED     Status: Abnormal   Collection Time: 10/18/22  2:14 PM  Result Value Ref Range   Sodium 145 135 - 145 mmol/L   Potassium 4.0 3.5 - 5.1 mmol/L   Chloride 108 98 - 111 mmol/L   BUN 18 8 - 23 mg/dL   Creatinine, Ser 5.64 (H) 0.61 - 1.24 mg/dL   Glucose, Bld 332 (H) 70 - 99 mg/dL    Comment: Glucose reference range applies only to samples taken after fasting for at least 8 hours.   Calcium, Ion 1.23 1.15 - 1.40 mmol/L   TCO2 23 22 - 32 mmol/L   Hemoglobin 17.0 13.0 - 17.0 g/dL   HCT 95.1 88.4 - 16.6 %   DG Chest Portable 1 View  Result Date: 10/18/2022 CLINICAL DATA:  Altered level of consciousness EXAM: PORTABLE CHEST 1 VIEW COMPARISON:  Through/2/24 FINDINGS: Single frontal view of the chest demonstrates  stable dual lead pacemaker. Cardiac silhouette is unremarkable. Chronic elevation right hemidiaphragm. No airspace disease, effusion, or pneumothorax. No acute bony abnormality. IMPRESSION: 1. Stable chest, no acute process. Electronically Signed   By: Sharlet Salina M.D.   On: 10/18/2022 14:34   CT HEAD CODE STROKE WO CONTRAST  Result Date: 10/18/2022 CLINICAL DATA:  Code stroke.  Neuro deficit, acute, stroke suspected EXAM: CT HEAD WITHOUT CONTRAST TECHNIQUE: Contiguous axial images were obtained from the base of the skull through the vertex without intravenous contrast. RADIATION DOSE REDUCTION: This exam was performed according to the departmental dose-optimization program which includes automated exposure control, adjustment of the mA and/or kV according to patient size and/or use of  iterative reconstruction technique. COMPARISON:  CT head 07/02/2022. FINDINGS: Brain: No evidence of acute large vascular territory infarct, acute hemorrhage, mass lesion, midline shift or hydrocephalus. Similar cerebral atrophy with ex vacuo ventricular dilation. Patchy white matter hypodensities, nonspecific but compatible with chronic microvascular ischemic change. Vascular: Calcific atherosclerosis.  No hyperdense vessel. Skull: No acute fractures. Sinuses/Orbits: Clear sinuses particular findings. Other: No mastoid effusions. ASPECTS [facility] Stroke Program Early CT Score) total score (0-10 with 10 being normal): 10. IMPRESSION: 1. No evidence of acute large vascular territory infarct or acute hemorrhage. ASPECTS is 10. 2. Chronic microvascular ischemic change and cerebral atrophy (ICD10-G31.9). Code stroke imaging results were communicated on 10/18/2022 at 2:23 pm to provider Dr. Amada Jupiter via secure text paging. Electronically Signed   By: Feliberto Harts M.D.   On: 10/18/2022 14:23    Pending Labs Unresulted Labs (From admission, onward)     Start     Ordered   10/19/22 0500  Basic metabolic panel  Tomorrow morning,   R        10/18/22 1653   10/19/22 0500  CBC  Tomorrow morning,   R        10/18/22 1653   10/18/22 1419  Urinalysis, w/ Reflex to Culture (Infection Suspected) -Urine, Clean Catch  Once,   URGENT       Question:  Specimen Source  Answer:  Urine, Clean Catch   10/18/22 1418   10/18/22 1419  Urine rapid drug screen (hosp performed)  Once,   STAT        10/18/22 1418            Vitals/Pain Today's Vitals   10/18/22 1636 10/18/22 1645 10/18/22 1646 10/18/22 1700  BP:  (!) 162/95  139/84  Pulse: 71 71 70 67  Resp: 14 18 15 15   Temp:      TempSrc:      SpO2: 100% 100% 100% 100%  Weight:        Isolation Precautions No active isolations  Medications Medications  sodium chloride flush (NS) 0.9 % injection 3 mL (has no administration in time range)  rivaroxaban  (XARELTO) tablet 10 mg (has no administration in time range)  lactated ringers infusion (has no administration in time range)  sodium chloride flush (NS) 0.9 % injection 3 mL (3 mLs Intravenous Given 10/18/22 1429)  lactated ringers bolus 1,000 mL (0 mLs Intravenous Stopped 10/18/22 1659)    Mobility walks     Focused Assessments    R Recommendations: See Admitting Provider Note  Report given to:   Additional Notes:

## 2022-10-19 ENCOUNTER — Observation Stay (HOSPITAL_COMMUNITY)
Admit: 2022-10-19 | Discharge: 2022-10-19 | Disposition: A | Discharge status: Home / Self Care | Payer: No Typology Code available for payment source | Attending: Internal Medicine | Admitting: Internal Medicine

## 2022-10-19 DIAGNOSIS — R569 Unspecified convulsions: Secondary | ICD-10-CM

## 2022-10-19 DIAGNOSIS — R55 Syncope and collapse: Secondary | ICD-10-CM | POA: Diagnosis not present

## 2022-10-19 DIAGNOSIS — I959 Hypotension, unspecified: Secondary | ICD-10-CM

## 2022-10-19 LAB — CBC
HCT: 42.5 % (ref 39.0–52.0)
Hemoglobin: 14.2 g/dL (ref 13.0–17.0)
MCH: 31.3 pg (ref 26.0–34.0)
MCHC: 33.4 g/dL (ref 30.0–36.0)
MCV: 93.8 fL (ref 80.0–100.0)
Platelets: 119 10*3/uL — ABNORMAL LOW (ref 150–400)
RBC: 4.53 MIL/uL (ref 4.22–5.81)
RDW: 14.4 % (ref 11.5–15.5)
WBC: 5.6 10*3/uL (ref 4.0–10.5)
nRBC: 0 % (ref 0.0–0.2)

## 2022-10-19 LAB — BASIC METABOLIC PANEL
Anion gap: 7 (ref 5–15)
BUN: 15 mg/dL (ref 8–23)
CO2: 23 mmol/L (ref 22–32)
Calcium: 9.2 mg/dL (ref 8.9–10.3)
Chloride: 109 mmol/L (ref 98–111)
Creatinine, Ser: 1.05 mg/dL (ref 0.61–1.24)
GFR, Estimated: >60 mL/min (ref >60)
Glucose, Bld: 100 mg/dL — ABNORMAL HIGH (ref 70–99)
Potassium: 3.3 mmol/L — ABNORMAL LOW (ref 3.5–5.1)
Sodium: 139 mmol/L (ref 135–145)

## 2022-10-19 LAB — GLUCOSE, CAPILLARY: Glucose-Capillary: 91 mg/dL (ref 70–99)

## 2022-10-19 MED ORDER — POTASSIUM CHLORIDE CRYS ER 20 MEQ PO TBCR
40.0000 meq | EXTENDED_RELEASE_TABLET | Freq: Once | ORAL | Status: AC
  Administered 2022-10-19: 40 meq via ORAL
  Filled 2022-10-19: qty 2

## 2022-10-19 MED ORDER — BRIMONIDINE TARTRATE 0.2 % OP SOLN: 1.0000 [drp] | Freq: Two times a day (BID) | OPHTHALMIC | 12 refills | Status: AC

## 2022-10-19 MED ORDER — ORAL CARE MOUTH RINSE: 15.0000 mL | OROMUCOSAL | Status: DC | PRN

## 2022-10-19 NOTE — Progress Notes (Signed)
DISCHARGE NOTE HOME Adonis Housekeeper to be discharged Home per MD order. Discussed prescriptions and follow up appointments with the patient. Prescriptions given to patient; medication list explained in detail. Patient verbalized understanding.  Skin clean, dry and intact without evidence of skin break down, no evidence of skin tears noted. IV catheter discontinued intact. Site without signs and symptoms of complications. Dressing and pressure applied. Pt denies pain at the site currently. No complaints noted.  Patient free of lines, drains, and wounds.   An After Visit Summary (AVS) was printed and given to the patient. Patient escorted via wheelchair, and discharged home via private auto. Picked up by Floy Sabina, wife.  Margarita Grizzle, RN

## 2022-10-19 NOTE — Progress Notes (Signed)
EEG complete - results pending 

## 2022-10-19 NOTE — Progress Notes (Addendum)
Subjective:   Summary: Dustin Hamilton is a 82 y.o. year old male currently admitted on the IMTS HD#0 for syncope.  Overnight Events: NAEON.  HD stable, oxygenating well on room air  Feels good this morning.  No further episodes of syncope and no seizure-like activity.  No chest pain, palpitations, dizziness or lightheadedness.  He has not walked much yet.  Has improved p.o. intake.  Went over plan for today, answered all questions  Objective:  Vital signs in last 24 hours: Vitals:   10/18/22 1851 10/18/22 1943 10/19/22 0500 10/19/22 0530  BP:  (!) 135/91  113/81  Pulse:  (!) 59  98  Resp:  17  15  Temp:  97.6 F (36.4 C)  98.4 F (36.9 C)  TempSrc:  Oral    SpO2:  100%  99%  Weight: 70 kg  70.4 kg   Height: 5\' 11"  (1.803 m)      Supplemental O2: Room Air SpO2: 99 %   Physical Exam:  Constitutional: NAD Cardiovascular: RRR, no murmurs, rubs or gallops Pulmonary/Chest: normal work of breathing on room air, lungs clear to auscultation bilaterally Abdominal: soft, non-tender, non-distended Extremities: warm, well perfused, extremity pulses 2+, no BLE pitting edema Neuro: Alert and oriented x 3, no focal neurological deficits  Filed Weights   10/18/22 1400 10/18/22 1851 10/19/22 0500  Weight: 73 kg 70 kg 70.4 kg     Intake/Output Summary (Last 24 hours) at 10/19/2022 0635 Last data filed at 10/19/2022 0355 Gross per 24 hour  Intake 1011 ml  Output 475 ml  Net 536 ml   Net IO Since Admission: 536 mL [10/19/22 0635]  Pertinent Labs:    Latest Ref Rng & Units 10/19/2022    2:16 AM 10/18/2022    2:14 PM 10/18/2022    2:08 PM  CBC  WBC 4.0 - 10.5 K/uL 5.6   6.0   Hemoglobin 13.0 - 17.0 g/dL 16.1  09.6  04.5   Hematocrit 39.0 - 52.0 % 42.5  50.0  50.4   Platelets 150 - 400 K/uL 119   141        Latest Ref Rng & Units 10/19/2022    2:16 AM 10/18/2022    2:14 PM 10/18/2022    2:08 PM  CMP  Glucose 70 - 99 mg/dL 409  811  914   BUN 8 - 23  mg/dL 15  18  17    Creatinine 0.61 - 1.24 mg/dL 7.82  9.56  2.13   Sodium 135 - 145 mmol/L 139  145  139   Potassium 3.5 - 5.1 mmol/L 3.3  4.0  4.0   Chloride 98 - 111 mmol/L 109  108  108   CO2 22 - 32 mmol/L 23   19   Calcium 8.9 - 10.3 mg/dL 9.2   08.6   Total Protein 6.5 - 8.1 g/dL   7.4   Total Bilirubin 0.3 - 1.2 mg/dL   0.5   Alkaline Phos 38 - 126 U/L   71   AST 15 - 41 U/L   22   ALT 0 - 44 U/L   15    Imaging: DG Chest Portable 1 View  Result Date: 10/18/2022 CLINICAL DATA:  Altered level of consciousness EXAM: PORTABLE CHEST 1 VIEW COMPARISON:  Through/2/24 FINDINGS: Single frontal view of the chest demonstrates stable dual lead pacemaker. Cardiac silhouette is unremarkable. Chronic elevation  right hemidiaphragm. No airspace disease, effusion, or pneumothorax. No acute bony abnormality. IMPRESSION: 1. Stable chest, no acute process. Electronically Signed   By: Sharlet Salina M.D.   On: 10/18/2022 14:34   CT HEAD CODE STROKE WO CONTRAST  Result Date: 10/18/2022 CLINICAL DATA:  Code stroke.  Neuro deficit, acute, stroke suspected EXAM: CT HEAD WITHOUT CONTRAST TECHNIQUE: Contiguous axial images were obtained from the base of the skull through the vertex without intravenous contrast. RADIATION DOSE REDUCTION: This exam was performed according to the departmental dose-optimization program which includes automated exposure control, adjustment of the mA and/or kV according to patient size and/or use of iterative reconstruction technique. COMPARISON:  CT head 07/02/2022. FINDINGS: Brain: No evidence of acute large vascular territory infarct, acute hemorrhage, mass lesion, midline shift or hydrocephalus. Similar cerebral atrophy with ex vacuo ventricular dilation. Patchy white matter hypodensities, nonspecific but compatible with chronic microvascular ischemic change. Vascular: Calcific atherosclerosis.  No hyperdense vessel. Skull: No acute fractures. Sinuses/Orbits: Clear sinuses particular  findings. Other: No mastoid effusions. ASPECTS [facility] Stroke Program Early CT Score) total score (0-10 with 10 being normal): 10. IMPRESSION: 1. No evidence of acute large vascular territory infarct or acute hemorrhage. ASPECTS is 10. 2. Chronic microvascular ischemic change and cerebral atrophy (ICD10-G31.9). Code stroke imaging results were communicated on 10/18/2022 at 2:23 pm to provider Dr. Amada Jupiter via secure text paging. Electronically Signed   By: Feliberto Harts M.D.   On: 10/18/2022 14:23     EKG: My EKG interpretation is as follows: personally reviewed my interpretation is pacemaker rhythm.  Some PVCs on telemetry   Assessment/Plan:   Principal Problem:   Syncope   Patient Summary: Dustin Hamilton is a 82 y.o. 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    #Prior strokes and vascular dementia # Syncope Feels good today, no arrhythmias recorded on telemetry. Urine studies are unremarkable. EEG this morning unremarkable. 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 He will follow up outpatient with his cardiologist  #BPH status post prostate artery embolization Resume dutasteride at dc, continue to hold flomax  #HTN Resume toprol at dc, continue to hold amlodipine 2.5  Diet: Normal IVF: None VTE:  Xarelto Code: Full PT/OT recs: Pending TOC recs:  Family Update:   Dispo: Anticipated discharge to Home in 0 days pending PT eval.   Lyndle Herrlich, MD PGY-1 Internal Medicine Resident Please contact the on call pager after 5 pm and on weekends at [phone].

## 2022-10-19 NOTE — Discharge Summary (Addendum)
Name: [name] MRN: [id number] DOB: June 17, 1940 82 y.o. PCP: [facility]  Date of Admission: 10/18/2022  2:07 PM Date of Discharge: 10/19/2022 Attending Physician: Dr.  Sol Blazing  Discharge Diagnosis: Principal Problem:   Syncope Active Problems:   Hypotension    Discharge Medications: Allergies as of 10/19/2022       Reactions   Aricept [donepezil] Other (See Comments)   Dizziness   Levetiracetam Other (See Comments)   AMS   Penicillins Hives   Viagra [sildenafil] Other (See Comments)   Dizziness        Medication List     STOP taking these medications    amLODipine 2.5 MG tablet Commonly known as: NORVASC   tamsulosin 0.4 MG Caps capsule Commonly known as: FLOMAX       TAKE these medications    allopurinol 100 MG tablet Commonly known as: Zyloprim Take 1 tablet (100 mg total) by mouth daily.   brimonidine 0.2 % ophthalmic solution Commonly known as: ALPHAGAN Place 1 drop into both eyes 2 (two) times daily.   dorzolamide 2 % ophthalmic solution Commonly known as: TRUSOPT Place 1 drop into both eyes 2 (two) times daily.   dutasteride 0.5 MG capsule Commonly known as: AVODART Take 0.5 mg by mouth daily.   ENSURE ACTIVE HEART HEALTH PO Take 237 mLs by mouth daily as needed (appetite, nutrition). Clear/apple   food thickener Gel Commonly known as: SIMPLYTHICK (NECTAR/LEVEL 2/MILDLY THICK) Take 1 packet by mouth as needed (PRN).   latanoprost 0.005 % ophthalmic solution Commonly known as: XALATAN Place 1 drop into both eyes at bedtime.   memantine 10 MG tablet Commonly known as: NAMENDA Take 20 mg by mouth at bedtime.   metoprolol succinate 25 MG 24 hr tablet Commonly known as: TOPROL-XL Take 0.5 tablets (12.5 mg total) by mouth at bedtime.   sertraline 50 MG tablet Commonly known as: ZOLOFT Take 25 mg by mouth in the morning.        Disposition and follow-up:   [name] was discharged from [facility] in Good condition.  At the hospital follow up visit please address:  1.  Follow-up:  a.  We have held Flomax and amlodipine at discharge.  Please reassess and restart these if appropriate.    b.  He had a brief interval detected on his pacemaker where his ventricular rate was greater than his atrial rate. Timeline was not consistent with presenting event. This rhythm was not clearly recorded so uncertain exactly what this is, but could be nonsustained V. tach.  Pacemaker was interrogated while inpatient and cleared. No additional arrhythmia events noted on telemetry. He needs to follow-up with his cardiologist and discuss this as well as possibly modifying his beta blocker or considering other antiarrhythmic.   c.  Please arrange follow-up with urology as needed for urinary retention if he is more symptomatic off of Flomax.   D.  He will continue home health PT and OT  2.  Labs / imaging needed at time of follow-up: BMP  3.  Pending labs/ test needing follow-up: None  Follow-up Appointments:  Follow-up Information     [facility] Follow up in 1 week(s).   Contact information: [address] [facility] [place] [address] [phone]         Runell Gess, MD. Schedule an appointment as soon as possible for a visit in 2 week(s).   Specialties: Cardiology, Radiology Contact information: [address] [place] [address] [phone]                 Hospital Course by problem list: Dustin Hamilton is a 82 y.o. 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  Unclear how long  he had been in that state, but at most 1 hour given his wife had been checking on him.  He had no memory of the situation, but started to regain memory towards the end of his second hospital day.  He was likely already at his neurological baseline by the time of our assessment.  Do not think he had stroke without any clear focal neurological deficits or seizures, he did receive spot EEG which was negative to further corroborate. He did possibly have abnormality recorded on his pacemaker where ventricular rate was higher than atrial rate which is discussed below, but lower suspicion that this caused his syncope.  Highest suspicion is likely for orthostatic syncope secondary to dehydration.  He had IV fluid resuscitation and subsequent negative orthostatics.  He had no signs of orthostasis or any other symptoms on assessment on day of discharge.  Had no further arrhythmias on telemetry throughout his admission.  Of note, he was also on dutasteride, Flomax, Toprol, Namenda, Zoloft in addition to Zanaflex, which can all make him orthostatic, contribute to syncope, or are centrally acting.  Will hold Flomax and amlodipine at discharge.  He was noted to not actually have any Zanaflex and this was removed from his medication list upon pharmacy med rec.  He will continue to have home health PT following discharge.   # AKI Presents with creatinine 1.48 from baseline approximately 1.  Likely prerenal in the setting of poor p.o. intake and dehydration.  His creatinine normalized with IV fluids and p.o. hydration and was at baseline on day of discharge.  # Second-degree AV block status post pacemaker #History of sinus sick syndrome As noted above, device rep informed the primary team that he had an interval recorded on his pacemaker where his ventricular rate was higher than his atrial rate.  Unsure exactly what the rhythm was at that time.  Additionally unsure if this was coinciding with the time of his syncopal event.   Pacemaker was interrogated in the ED and was found to be functional for the purpose of detecting second-degree AV block, sinus sick syndrome.  He did not have any further arrhythmias during the admission.  Have a low suspicion that his syncope was cardiogenic in nature, but he will follow-up outpatient with his cardiologist for further assessment. We will continue toprol at dc to help combat any tachyarrhythmia, do not think it will significantly contribute to orthostasis at current dose.   #BPH status post prostate artery embolization Held Flomax and dutasteride during the admission.  Would restart dutasteride at discharge and continue holding Flomax.  If he has further symptoms of urinary retention, would follow-up with urology.  Need to be careful restarting Flomax given his high risk for orthostasis.   #HTN Home antihypertensives held during admission.  Restarting toprol 12.5 mg daily at discharge, but will continue to hold amlodipine 2.5 mg daily.  Subjective: No acute events overnight.  He feels good this  morning.  No further episodes of syncope or seizure-like activity.  No chest pain, palpitations, dizziness or lightheadedness.  He will ambulate with PT later.  Discharge Vitals:   BP 119/77 (BP Location: Right Arm)   Pulse 60   Temp 98 F (36.7 C) (Oral)   Resp 18   Ht 5\' 11"  (1.803 m)   Wt 70.4 kg   SpO2 96%   BMI 21.65 kg/m  Discharge exam: Constitutional: NAD Cardiovascular: RRR, no murmurs, rubs or gallops Pulmonary/Chest: normal work of breathing on room air, lungs clear to auscultation bilaterally Abdominal: soft, non-tender, non-distended Extremities: warm, well perfused, extremity pulses 2+, no BLE pitting edema Neuro: Alert and oriented x 3, no focal neurological deficits  Pertinent Labs, Studies, and Procedures:     Latest Ref Rng & Units 10/19/2022    2:16 AM 10/18/2022    2:14 PM 10/18/2022    2:08 PM  CBC  WBC 4.0 - 10.5 K/uL 5.6   6.0   Hemoglobin 13.0 - 17.0  g/dL 84.6  96.2  95.2   Hematocrit 39.0 - 52.0 % 42.5  50.0  50.4   Platelets 150 - 400 K/uL 119   141        Latest Ref Rng & Units 10/19/2022    2:16 AM 10/18/2022    2:14 PM 10/18/2022    2:08 PM  CMP  Glucose 70 - 99 mg/dL 841  324  401   BUN 8 - 23 mg/dL 15  18  17    Creatinine 0.61 - 1.24 mg/dL 0.27  2.53  6.64   Sodium 135 - 145 mmol/L 139  145  139   Potassium 3.5 - 5.1 mmol/L 3.3  4.0  4.0   Chloride 98 - 111 mmol/L 109  108  108   CO2 22 - 32 mmol/L 23   19   Calcium 8.9 - 10.3 mg/dL 9.2   40.3   Total Protein 6.5 - 8.1 g/dL   7.4   Total Bilirubin 0.3 - 1.2 mg/dL   0.5   Alkaline Phos 38 - 126 U/L   71   AST 15 - 41 U/L   22   ALT 0 - 44 U/L   15     EEG adult  Result Date: 10/19/2022 Charlsie Quest, MD     10/19/2022 10:03 AM Patient Name: THADIS BRODIE MRN: [id number] Epilepsy Attending: Charlsie Quest Referring Physician/Provider: Rejeana Brock, MD Date: 10/19/2022 Duration: 22.47 mins Patient history:  82 y.o. male with a past medical history of vascular dementia, CVA, BPH and heart disease presenting after being found unresponsive in his garden by his wife. EEG to evaluate for seizure. Level of alertness: Awake AEDs during EEG study: None Technical aspects: This EEG study was done with scalp electrodes positioned according to the 10-20 International system of electrode placement. Electrical activity was reviewed with band pass filter of 1-70Hz , sensitivity of 7 uV/mm, display speed of 66mm/sec with a 60Hz  notched filter applied as appropriate. EEG data were recorded continuously and digitally stored.  Video monitoring was available and reviewed as appropriate. Description: The posterior dominant rhythm consists of 9 Hz activity of moderate voltage (25-35 uV) seen predominantly in posterior head regions, symmetric and reactive to eye opening and eye closing. Hyperventilation and photic stimulation were not performed.   IMPRESSION: This study is within normal  limits. No seizures or epileptiform discharges were seen throughout the recording. A normal interictal EEG does not exclude  the diagnosis  of epilepsy. Charlsie Quest     Discharge Instructions: Discharge Instructions     Diet - low sodium heart healthy   Complete by: As directed    Discharge instructions   Complete by: As directed    We think Mr. Sadri passed out because he was dehydrated.  His kidney function has improved and returned back to normal and it seems like he is no longer symptomatic.  1.  Please make sure to drink plenty of fluids.  We also want you to stop taking your amlodipine and your Flomax until you can see your primary doctor again.  If you are having trouble urinating, I would also make an appointment with your urologist. 2.  Please follow-up with your cardiologists so they can reassess you and your pacemaker. 3.  Please continue home health PT and OT   Increase activity slowly   Complete by: As directed        Discharge Instructions   None     Signed: Lyndle Herrlich, MD 10/19/2022, 2:24 PM   Pager: [phone]

## 2022-10-19 NOTE — Evaluation (Signed)
Physical Therapy Evaluation and Discharge  Patient Details Name: Dustin Hamilton MRN: [id number] DOB: [birth year] Today's Date: 10/19/2022  History of Present Illness  82 yo admitted for a syncopal event, found down by his wife in his garden;  living with hypertension, previous strokes, vascular dementia, BPH s/p prostatic artery embolization, CHB w/ ppm and SVT s/p AVNRT ablation  Clinical Impression   Patient evaluated by Physical Therapy with no further acute PT needs identified, as plan is to dc home today; All education has been completed and the patient has no further questions. Orthostatic vitals taken, and negative for significant drop with upright activity; See vitals flow sheet.  See below for any follow-up Physical Therapy or equipment needs. PT is signing off. Thank you for this referral.        Recommendations for follow up therapy are one component of a multi-disciplinary discharge planning process, led by the attending physician.  Recommendations may be updated based on patient status, additional functional criteria and insurance authorization.  Follow Up Recommendations       Assistance Recommended at Discharge Intermittent Supervision/Assistance  Patient can return home with the following  Assistance with cooking/housework    Equipment Recommendations None recommended by PT  Recommendations for Other Services       Functional Status Assessment Patient has had a recent decline in their functional status and demonstrates the ability to make significant improvements in function in a reasonable and predictable amount of time.     Precautions / Restrictions Precautions Precautions: Fall Precaution Comments: Fall risk is present, but minimized with use of RW      Mobility  Bed Mobility Overal bed mobility: Modified Independent             General bed mobility comments: used bedrail    Transfers Overall transfer level: Needs assistance Equipment used:  Rolling walker (2 wheels) Transfers: Sit to/from Stand Sit to Stand: Min guard           General transfer comment: cues for hand placement    Ambulation/Gait Ambulation/Gait assistance: Min guard (with and without physical contact) Gait Distance (Feet): 200 Feet Assistive device: Rolling walker (2 wheels) Gait Pattern/deviations: Trunk flexed Gait velocity: slowed     General Gait Details: Overall managing well with bil UE support on RW; wife states he typically walks with trunk flexed  Stairs            Wheelchair Mobility    Modified Rankin (Stroke Patients Only)       Balance                                             Pertinent Vitals/Pain Pain Assessment Pain Assessment: No/denies pain    Home Living Family/patient expects to be discharged to:: Private residence Living Arrangements: Spouse/significant other Available Help at Discharge: Family;Available 24 hours/day Type of Home: House Home Access: Ramped entrance       Home Layout: Two level;Full bath on main level Home Equipment: Educational psychologist (4 wheels);Cane - single point      Prior Function Prior Level of Function : Needs assist             Mobility Comments: walks with a rollator ADLs Comments: prn assist     Hand Dominance   Dominant Hand: Right    Extremity/Trunk Assessment   Upper Extremity Assessment Upper Extremity Assessment:  Overall [facility] for tasks assessed    Lower Extremity Assessment Lower Extremity Assessment: Generalized weakness       Communication   Communication: No difficulties  Cognition Arousal/Alertness: Awake/alert Behavior During Therapy: WFL for tasks assessed/performed Overall Cognitive Status: History of cognitive impairments - at baseline                                          General Comments General comments (skin integrity, edema, etc.): Orthostatic vitals did not demontrate a significant drop  with upright/standing activity    Exercises     Assessment/Plan    PT Assessment All further PT needs can be met in the next venue of care  PT Problem List Decreased strength;Decreased range of motion;Decreased activity tolerance;Decreased balance;Decreased mobility;Decreased cognition;Decreased coordination;Decreased knowledge of use of DME;Decreased safety awareness;Decreased knowledge of precautions       PT Treatment Interventions      PT Goals (Current goals can be found in the Care Plan section)  Acute Rehab PT Goals Patient Stated Goal: Hopes to get home soon PT Goal Formulation: All assessment and education complete, DC therapy    Frequency       Co-evaluation               AM-PAC PT "6 Clicks" Mobility  Outcome Measure Help needed turning from your back to your side while in a flat bed without using bedrails?: None Help needed moving from lying on your back to sitting on the side of a flat bed without using bedrails?: None Help needed moving to and from a bed to a chair (including a wheelchair)?: A Little Help needed standing up from a chair using your arms (e.g., wheelchair or bedside chair)?: A Little Help needed to walk in hospital room?: A Little Help needed climbing 3-5 steps with a railing? : A Little 6 Click Score: 20    End of Session Equipment Utilized During Treatment: Gait belt Activity Tolerance: Patient tolerated treatment well Patient left: in bed;with call bell/phone within reach;with family/visitor present Nurse Communication: Mobility status (orthostatics negative) PT Visit Diagnosis: Other abnormalities of gait and mobility (R26.89)    Time: 1610-9604 PT Time Calculation (min) (ACUTE ONLY): 31 min   Charges:   PT Evaluation $PT Eval Moderate Complexity: 1 Mod PT Treatments $Gait Training: 8-22 mins        Van Clines, PT  Acute Rehabilitation Services Office [phone] Secure Chat welcomed   [name] 10/19/2022,  4:22 PM

## 2022-10-19 NOTE — Progress Notes (Signed)
Neurology Progress Note  Brief HPI: 82 y.o. male with a past medical history of vascular dementia, CVA, BPH and heart disease presenting after being found unresponsive in his garden by his wife.  Last known well was 11 AM when his wife saw him go outside to work in the garden.  She found him outside unresponsive and called EMS.  In route to the hospital he became more responsive, initially his blood pressure was 180/100, however when they retook it manually it was systolic 70s.    He did get an LR bolus in the ED.  Creatinine elevated at 1.4, hemoglobin 17.  On chart review he has had positive orthostatic vitals documented in November 2023.  He is currently on Namenda.  He is able to tell us that he has had fainting episodes in the past.  He does not remember the episode today.   Subjective: Patient seen in room with his wife.  Today he tells me that he was digging in the garden which is something he typically does not do and he believes he was working harder than normal.  He acknowledges that he does not drink enough water at home and does have a tendency to become dehydrated.  He and his wife agree that he is at his baseline currently.  Exam: Vitals:   10/19/22 0530 10/19/22 0930  BP: 113/81 119/77  Pulse: 98 60  Resp: 15 18  Temp: 98.4 F (36.9 C) 98 F (36.7 C)  SpO2: 99% 96%   Gen: In bed, NAD Resp: non-labored breathing, no acute distress Abd: soft, nt  NEURO:  Mental Status: Alert to self, unable to tell me the date or where we are, states he is 63 Language: speech is clear.  Naming, repetition, fluency, and comprehension intact. Cranial Nerves: PERRL . EOMI, visual fields full, symmetric smile at baseline, facial sensation intact, hearing intact, tongue/uvula/soft palate midline, normal sternocleidomastoid and trapezius muscle strength. No evidence of tongue atrophy or fasciculations Motor:  Tone: is normal and bulk is normal Sensation- Intact to light touch  bilaterally Coordination: FTN intact bilaterally, no ataxia in BLE. Gait- deferred    Pertinent Labs: Plt-   Imaging Reviewed: CT-head 1. No evidence of acute large vascular territory infarct or acute hemorrhage. ASPECTS is 10. 2. Chronic microvascular ischemic change and cerebral atrophy  EEG  This study is within normal limits. No seizures or epileptiform discharges were seen throughout the recording. A normal interictal EEG does not exclude  the diagnosis of epilepsy.   Orthostatic VS for the past 24 hrs:  BP- Lying Pulse- Lying BP- Sitting Pulse- Sitting BP- Standing at 0 minutes Pulse- Standing at 0 minutes  10/18/22 1700 109/77 75 124/78 75 127/85 80      Assessment:  82 year old male who initially presented after an unresponsive episode.  He was hypotensive on arrival to the ED and seems to have recovered after fluids.  EEG and CT head are negative for acute abnormality.  Today he tells me he was working too hard in his garden and he knows that he does not drink enough water.  We discussed that it is important for him to stay adequately hydrated especially if he is working outside in the heat.  He plans to follow-up with his PCP in the next week or 2.  His wife is agreeable to this plan  Recommendations: - Follow up with PCP at discharge - Neurology will sign off    Patient seen and examined by NP/APP  and discussed with MD  Elmer Picker, DNP, FNP-BC [facility] Pager: [phone]

## 2022-10-28 NOTE — Progress Notes (Signed)
Remote pacemaker transmission.   

## 2022-11-14 DIAGNOSIS — R972 Elevated prostate specific antigen [PSA]: Secondary | ICD-10-CM | POA: Diagnosis not present

## 2022-11-21 DIAGNOSIS — R972 Elevated prostate specific antigen [PSA]: Secondary | ICD-10-CM | POA: Diagnosis not present

## 2022-11-21 DIAGNOSIS — N401 Enlarged prostate with lower urinary tract symptoms: Secondary | ICD-10-CM | POA: Diagnosis not present

## 2022-11-21 DIAGNOSIS — R3915 Urgency of urination: Secondary | ICD-10-CM | POA: Diagnosis not present

## 2022-12-03 DIAGNOSIS — R809 Proteinuria, unspecified: Secondary | ICD-10-CM | POA: Diagnosis not present

## 2022-12-03 DIAGNOSIS — E7800 Pure hypercholesterolemia, unspecified: Secondary | ICD-10-CM | POA: Diagnosis not present

## 2022-12-10 DIAGNOSIS — F0390 Unspecified dementia without behavioral disturbance: Secondary | ICD-10-CM | POA: Diagnosis not present

## 2022-12-10 DIAGNOSIS — N390 Urinary tract infection, site not specified: Secondary | ICD-10-CM | POA: Diagnosis not present

## 2022-12-10 DIAGNOSIS — E860 Dehydration: Secondary | ICD-10-CM | POA: Diagnosis not present

## 2022-12-10 DIAGNOSIS — I10 Essential (primary) hypertension: Secondary | ICD-10-CM | POA: Diagnosis not present

## 2023-01-09 ENCOUNTER — Ambulatory Visit (INDEPENDENT_AMBULATORY_CARE_PROVIDER_SITE_OTHER): Payer: Medicare PPO

## 2023-01-09 DIAGNOSIS — I442 Atrioventricular block, complete: Secondary | ICD-10-CM | POA: Diagnosis not present

## 2023-01-09 LAB — CUP PACEART REMOTE DEVICE CHECK
Date Time Interrogation Session: 20240809075303
Implantable Lead Connection Status: 753985
Implantable Lead Connection Status: 753985
Implantable Lead Implant Date: 20211227
Implantable Lead Implant Date: 20211227
Implantable Lead Location: 753859
Implantable Lead Location: 753860
Implantable Lead Model: 377
Implantable Lead Model: 377
Implantable Lead Serial Number: 8000018960
Implantable Lead Serial Number: 8000042643
Implantable Pulse Generator Implant Date: 20211227
Pulse Gen Model: 407145
Pulse Gen Serial Number: 69915927

## 2023-01-14 DIAGNOSIS — N401 Enlarged prostate with lower urinary tract symptoms: Secondary | ICD-10-CM | POA: Diagnosis not present

## 2023-01-14 DIAGNOSIS — R3915 Urgency of urination: Secondary | ICD-10-CM | POA: Diagnosis not present

## 2023-01-14 DIAGNOSIS — R8271 Bacteriuria: Secondary | ICD-10-CM | POA: Diagnosis not present

## 2023-01-14 DIAGNOSIS — R310 Gross hematuria: Secondary | ICD-10-CM | POA: Diagnosis not present

## 2023-01-23 NOTE — Progress Notes (Signed)
Remote pacemaker transmission.   

## 2023-02-05 DIAGNOSIS — R350 Frequency of micturition: Secondary | ICD-10-CM | POA: Diagnosis not present

## 2023-02-05 DIAGNOSIS — I10 Essential (primary) hypertension: Secondary | ICD-10-CM | POA: Diagnosis not present

## 2023-02-08 ENCOUNTER — Other Ambulatory Visit: Payer: Self-pay

## 2023-02-08 ENCOUNTER — Encounter (HOSPITAL_COMMUNITY): Payer: Self-pay

## 2023-02-08 ENCOUNTER — Emergency Department (HOSPITAL_COMMUNITY)
Admission: EM | Admit: 2023-02-08 | Discharge: 2023-02-08 | Disposition: A | Discharge status: Home / Self Care | Payer: No Typology Code available for payment source | Attending: Emergency Medicine | Admitting: Emergency Medicine

## 2023-02-08 ENCOUNTER — Emergency Department (HOSPITAL_COMMUNITY): Payer: No Typology Code available for payment source

## 2023-02-08 DIAGNOSIS — I10 Essential (primary) hypertension: Secondary | ICD-10-CM | POA: Insufficient documentation

## 2023-02-08 DIAGNOSIS — F0390 Unspecified dementia without behavioral disturbance: Secondary | ICD-10-CM | POA: Diagnosis not present

## 2023-02-08 DIAGNOSIS — Z79899 Other long term (current) drug therapy: Secondary | ICD-10-CM | POA: Insufficient documentation

## 2023-02-08 DIAGNOSIS — M79674 Pain in right toe(s): Secondary | ICD-10-CM | POA: Diagnosis present

## 2023-02-08 LAB — URINALYSIS, W/ REFLEX TO CULTURE (INFECTION SUSPECTED)
Bacteria, UA: NONE SEEN
Bilirubin Urine: NEGATIVE
Glucose, UA: NEGATIVE mg/dL
Hgb urine dipstick: NEGATIVE
Ketones, ur: NEGATIVE mg/dL
Leukocytes,Ua: NEGATIVE
Nitrite: NEGATIVE
Protein, ur: 30 mg/dL — AB
Specific Gravity, Urine: 1.019 (ref 1.005–1.030)
pH: 5 (ref 5.0–8.0)

## 2023-02-08 LAB — CBC WITH DIFFERENTIAL/PLATELET
Abs Immature Granulocytes: 0.01 10*3/uL (ref 0.00–0.07)
Basophils Absolute: 0 10*3/uL (ref 0.0–0.1)
Basophils Relative: 1 %
Eosinophils Absolute: 0.1 10*3/uL (ref 0.0–0.5)
Eosinophils Relative: 2 %
HCT: 49 % (ref 39.0–52.0)
Hemoglobin: 16.3 g/dL (ref 13.0–17.0)
Immature Granulocytes: 0 %
Lymphocytes Relative: 24 %
Lymphs Abs: 1.2 10*3/uL (ref 0.7–4.0)
MCH: 32.3 pg (ref 26.0–34.0)
MCHC: 33.3 g/dL (ref 30.0–36.0)
MCV: 97 fL (ref 80.0–100.0)
Monocytes Absolute: 0.3 10*3/uL (ref 0.1–1.0)
Monocytes Relative: 6 %
Neutro Abs: 3.3 10*3/uL (ref 1.7–7.7)
Neutrophils Relative %: 67 %
Platelets: 122 10*3/uL — ABNORMAL LOW (ref 150–400)
RBC: 5.05 MIL/uL (ref 4.22–5.81)
RDW: 14 % (ref 11.5–15.5)
WBC: 4.8 10*3/uL (ref 4.0–10.5)
nRBC: 0 % (ref 0.0–0.2)

## 2023-02-08 LAB — BASIC METABOLIC PANEL
Anion gap: 8 (ref 5–15)
BUN: 16 mg/dL (ref 8–23)
CO2: 25 mmol/L (ref 22–32)
Calcium: 9.6 mg/dL (ref 8.9–10.3)
Chloride: 108 mmol/L (ref 98–111)
Creatinine, Ser: 1.01 mg/dL (ref 0.61–1.24)
GFR, Estimated: >60 mL/min (ref >60)
Glucose, Bld: 125 mg/dL — ABNORMAL HIGH (ref 70–99)
Potassium: 3.6 mmol/L (ref 3.5–5.1)
Sodium: 141 mmol/L (ref 135–145)

## 2023-02-08 MED ORDER — ACETAMINOPHEN 325 MG PO TABS
650.0000 mg | ORAL_TABLET | Freq: Once | ORAL | Status: AC
  Administered 2023-02-08: 650 mg via ORAL
  Filled 2023-02-08: qty 2

## 2023-02-08 NOTE — ED Provider Notes (Signed)
[place] EMERGENCY DEPARTMENT AT [facility] Provider Note   CSN: [id number] Arrival date & time: 02/08/23  0272     History  Chief Complaint  Patient presents with   Toe Pain    Dustin Hamilton is a 82 y.o. male with PMHx dementia, CVA, HTN, gout who presents to ED with wife who is concerned for patient's right toe pain. Denies any known trauma to toe. Patient has recently been on Allopurinol for toe pain which does not seem to be helping his symptoms. Wife stating that patient refuses to put weight on his right foot.  Wife also stating that patient has been "acting differently". She mentions that he is not eating like he normally does, but is otherwise without a specific concern for is activity. Wife stating that patient has personality changes when he has a UTI. Wife and patient are denying fever, dyspnea, cough, abdominal pain, nausea, vomiting, diarrhea.    Toe Pain       Home Medications Prior to Admission medications   Medication Sig Start Date End Date Taking? Authorizing Provider  allopurinol (ZYLOPRIM) 100 MG tablet Take 1 tablet (100 mg total) by mouth daily. 04/05/21 07/31/22  Pokhrel, Rebekah Chesterfield, MD  brimonidine (ALPHAGAN) 0.2 % ophthalmic solution Place 1 drop into both eyes 2 (two) times daily. 10/19/22   Lyndle Herrlich, MD  dorzolamide (TRUSOPT) 2 % ophthalmic solution Place 1 drop into both eyes 2 (two) times daily.    [provider]  dutasteride (AVODART) 0.5 MG capsule Take 0.5 mg by mouth daily.    [provider]  food thickener (SIMPLYTHICK, NECTAR/LEVEL 2/MILDLY THICK,) GEL Take 1 packet by mouth as needed (PRN).    [provider]  latanoprost (XALATAN) 0.005 % ophthalmic solution Place 1 drop into both eyes at bedtime.     [provider]  memantine (NAMENDA) 10 MG tablet Take 20 mg by mouth at bedtime.    [provider]  metoprolol succinate (TOPROL-XL) 25 MG 24 hr tablet Take 0.5 tablets  (12.5 mg total) by mouth at bedtime. 05/31/20   Lilland, Alana, DO  Nutritional Supplements (ENSURE ACTIVE HEART HEALTH PO) Take 237 mLs by mouth daily as needed (appetite, nutrition). Clear/apple    [provider]  sertraline (ZOLOFT) 50 MG tablet Take 25 mg by mouth in the morning.    [provider]      Allergies    Aricept [donepezil], Levetiracetam, Penicillins, and Viagra [sildenafil]    Review of Systems   Review of Systems  Musculoskeletal:        Toe pain    Physical Exam Updated Vital Signs BP (!) 157/90 (BP Location: Right Arm)   Pulse 60   Temp 97.9 F (36.6 C) (Oral)   Resp 19   SpO2 96%  Physical Exam Vitals and nursing note reviewed.  Constitutional:      General: He is not in acute distress.    Appearance: He is not ill-appearing or toxic-appearing.  HENT:     Head: Normocephalic and atraumatic.     Mouth/Throat:     Mouth: Mucous membranes are moist.  Eyes:     General: No scleral icterus.       Right eye: No discharge.        Left eye: No discharge.     Conjunctiva/sclera: Conjunctivae normal.  Cardiovascular:     Rate and Rhythm: Normal rate and regular rhythm.     Pulses: Normal pulses.     Heart  sounds: Normal heart sounds. No murmur heard. Pulmonary:     Effort: Pulmonary effort is normal. No respiratory distress.     Breath sounds: Normal breath sounds. No wheezing, rhonchi or rales.  Abdominal:     General: Abdomen is flat. Bowel sounds are normal. There is no distension.     Palpations: Abdomen is soft. There is no mass.     Tenderness: There is no abdominal tenderness.  Musculoskeletal:     Right lower leg: No edema.     Left lower leg: No edema.     Comments: 3rd toe pain to palpation. No swelling, erythema, ulcerations or increased warmth. +2 pedal pulses. Sensation to light touch intact.  Skin:    General: Skin is warm and dry.     Findings: No rash.  Neurological:     General: No focal deficit present.      Mental Status: He is alert. Mental status is at baseline.     Comments: Pleasantly demented  Psychiatric:        Mood and Affect: Mood normal.     ED Results / Procedures / Treatments   Labs (all labs ordered are listed, but only abnormal results are displayed) Labs Reviewed  URINALYSIS, W/ REFLEX TO CULTURE (INFECTION SUSPECTED) - Abnormal; Notable for the following components:      Result Value   APPearance HAZY (*)    Protein, ur 30 (*)    All other components within normal limits  CBC WITH DIFFERENTIAL/PLATELET - Abnormal; Notable for the following components:   Platelets 122 (*)    All other components within normal limits  BASIC METABOLIC PANEL - Abnormal; Notable for the following components:   Glucose, Bld 125 (*)    All other components within normal limits  URINE CULTURE    EKG None  Radiology DG Foot Complete Right  Result Date: 02/08/2023 CLINICAL DATA:  Third toe pain. EXAM: RIGHT FOOT COMPLETE - 3+ VIEW COMPARISON:  None Available. FINDINGS: There is no evidence of fracture or dislocation. Mild posterior calcaneal spurring is noted. No significant joint space narrowing is noted. Soft tissues are unremarkable. IMPRESSION: No acute abnormality seen. Electronically Signed   By: Lupita Raider M.D.   On: 02/08/2023 10:56    Procedures Procedures    Medications Ordered in ED Medications - No data to display  ED Course/ Medical Decision Making/ A&P                                 Medical Decision Making Amount and/or Complexity of Data Reviewed Labs: ordered. Radiology: ordered.   This patient presents to the ED for concern of 3rd toe pain, this involves an extensive number of treatment options, and is a complaint that carries with it a high risk of complications and morbidity.  The differential diagnosis includes hemarthrosis, gout, septic joint, fracture, tendonitis, carpal tunnel syndrome, muscle strain, bursitis, compartment syndrome   Co morbidities  that complicate the patient evaluation  dementia, CVA, HTN, gout   Lab Tests:  I Ordered, and personally interpreted labs.  The pertinent results include:   -BMP: no concern for electrolyte abnormality; no concern for kidney damage -CBC: No concern for anemia or leukocytosis. Plt are low but actually improved from last value -UA: not concerning for infection   Imaging Studies ordered:  I ordered imaging studies including  - foot Xray: to assess for process contributing to patient's symptoms I  independently visualized and interpreted imaging Shared findings with patient I agree with the radiologist interpretation    Problem List / ED Course / Critical interventions / Medication management  Patient presents with wife to ED who is concerned for patient's toe pain over the past week. Patient has been taking allopurinol without relief in symptoms. Physical exam with tenderness to palpation of right third toe - but is otherwise unremarkable. Patient afebrile with stable vitals.  Wife also stating that patient has been acting differently. Wife unable to provide exact examples of patient's personality changes. She states that patient acts differently when he has a UTI. Patient and wife declining any infectious symptoms today. Overall, patient is well appearing. Foot xray unremarkable. CBC without leukocytosis or anemia. PLT are low at 122 but actually improved from last value. BMP without electrolytes abnormality. BUN/Cr within normal limits. UA without concern for infection. Shared results with patient and wife. Patient stating that he feels great and is ready to go home. Recommend following up with PCP. Patient and wife verbally agreed to plan. Staffed patient with Dr. Renaye Rakers. I have reviewed the patients home medicines and have made adjustments as needed Patient afebrile with stable vitals. Provided with return precautions. Discharged in good condition.   Ddx these are considered less  likely due to history of present illness and physical exam -hemarthrosis: joint without swelling; ROM intact -gout: no warmth or erythema; ROM intact  -septic joint: afebrile; no warmth or erythema; no skin changes; ROM intact  -fracture: xray without concern  -compartment syndrome: area not tense; neurovascularly intact   Social Determinants of Health:  none           Final Clinical Impression(s) / ED Diagnoses Final diagnoses:  Toe pain, right    Rx / DC Orders ED Discharge Orders     None         Dorthy Cooler, [place] 02/08/23 1321    Terald Sleeper, MD 02/08/23 1345

## 2023-02-08 NOTE — ED Triage Notes (Signed)
Pt accompanied by wife who reports pt has been c.o right toe pain for the past week. Hx of gout and has been taking his allopurinol as prescribed. Pt has hx of dementia, unable to tell me which toe. No pain with palpation, no swelling noted. Wife also reports they went to UC over the weekend with an attempt to check for UTI but he could not urinate. Pts wife states sometimes a UTI can cause him to act differently.

## 2023-02-08 NOTE — Discharge Instructions (Addendum)
It was a pleasure caring for you today. Lab work up was reassuring. Please follow up with your primary care provider. Seek emergency care if experiencing any new or worsening symptoms.  Alternating between 650 mg Tylenol and 400 mg Advil: The best way to alternate taking Acetaminophen (example Tylenol) and Ibuprofen (example Advil/Motrin) is to take them 3 hours apart. For example, if you take ibuprofen at 6 am you can then take Tylenol at 9 am. You can continue this regimen throughout the day, making sure you do not exceed the recommended maximum dose for each drug.

## 2023-02-10 LAB — URINE CULTURE: Culture: <10000 — AB

## 2023-02-16 ENCOUNTER — Other Ambulatory Visit: Payer: Self-pay | Admitting: Interventional Radiology

## 2023-02-16 DIAGNOSIS — N138 Other obstructive and reflux uropathy: Secondary | ICD-10-CM

## 2023-03-06 ENCOUNTER — Telehealth: Payer: Self-pay

## 2023-03-06 NOTE — Telephone Encounter (Signed)
Transition Care Management Unsuccessful Follow-up Telephone Call  Date of discharge and from where:  02/08/2023 [facility]  Attempts:  1st Attempt  Reason for unsuccessful TCM follow-up call:  No answer/busy  Eyob Godlewski [facility]  [facility], [facility] Resource Care Guide Direct Dial: [phone]  Website: [email or web address]

## 2023-03-09 ENCOUNTER — Telehealth: Payer: Self-pay

## 2023-03-09 NOTE — Telephone Encounter (Signed)
Transition Care Management Unsuccessful Follow-up Telephone Call  Date of discharge and from where:  02/08/2023 [facility]  Attempts:  2nd Attempt  Reason for unsuccessful TCM follow-up call:  No answer/busy  [facility]  [facility] Resource Care Guide Direct Dial: [phone]  Website: [email or web address]

## 2023-03-14 ENCOUNTER — Emergency Department (HOSPITAL_COMMUNITY): Payer: Non-veteran care

## 2023-03-14 ENCOUNTER — Inpatient Hospital Stay (HOSPITAL_COMMUNITY)
Admission: EM | Admit: 2023-03-14 | Discharge: 2023-03-21 | DRG: 064 | Disposition: A | Discharge status: Skilled Nursing Facility | Payer: Non-veteran care | Attending: Internal Medicine | Admitting: Internal Medicine

## 2023-03-14 DIAGNOSIS — Z8619 Personal history of other infectious and parasitic diseases: Secondary | ICD-10-CM

## 2023-03-14 DIAGNOSIS — R4701 Aphasia: Secondary | ICD-10-CM | POA: Diagnosis present

## 2023-03-14 DIAGNOSIS — H409 Unspecified glaucoma: Secondary | ICD-10-CM | POA: Diagnosis present

## 2023-03-14 DIAGNOSIS — N400 Enlarged prostate without lower urinary tract symptoms: Secondary | ICD-10-CM | POA: Diagnosis present

## 2023-03-14 DIAGNOSIS — I4892 Unspecified atrial flutter: Secondary | ICD-10-CM | POA: Diagnosis present

## 2023-03-14 DIAGNOSIS — Z8719 Personal history of other diseases of the digestive system: Secondary | ICD-10-CM

## 2023-03-14 DIAGNOSIS — K760 Fatty (change of) liver, not elsewhere classified: Secondary | ICD-10-CM | POA: Diagnosis present

## 2023-03-14 DIAGNOSIS — Z7401 Bed confinement status: Secondary | ICD-10-CM | POA: Diagnosis not present

## 2023-03-14 DIAGNOSIS — R531 Weakness: Secondary | ICD-10-CM | POA: Diagnosis not present

## 2023-03-14 DIAGNOSIS — Z87891 Personal history of nicotine dependence: Secondary | ICD-10-CM

## 2023-03-14 DIAGNOSIS — B957 Other staphylococcus as the cause of diseases classified elsewhere: Secondary | ICD-10-CM | POA: Diagnosis present

## 2023-03-14 DIAGNOSIS — I63533 Cerebral infarction due to unspecified occlusion or stenosis of bilateral posterior cerebral arteries: Principal | ICD-10-CM | POA: Diagnosis present

## 2023-03-14 DIAGNOSIS — R471 Dysarthria and anarthria: Secondary | ICD-10-CM | POA: Diagnosis present

## 2023-03-14 DIAGNOSIS — I6782 Cerebral ischemia: Secondary | ICD-10-CM | POA: Diagnosis not present

## 2023-03-14 DIAGNOSIS — F1411 Cocaine abuse, in remission: Secondary | ICD-10-CM | POA: Diagnosis present

## 2023-03-14 DIAGNOSIS — Z841 Family history of disorders of kidney and ureter: Secondary | ICD-10-CM

## 2023-03-14 DIAGNOSIS — N390 Urinary tract infection, site not specified: Secondary | ICD-10-CM | POA: Diagnosis present

## 2023-03-14 DIAGNOSIS — D696 Thrombocytopenia, unspecified: Secondary | ICD-10-CM | POA: Diagnosis present

## 2023-03-14 DIAGNOSIS — G9349 Other encephalopathy: Secondary | ICD-10-CM | POA: Diagnosis present

## 2023-03-14 DIAGNOSIS — I119 Hypertensive heart disease without heart failure: Secondary | ICD-10-CM | POA: Diagnosis present

## 2023-03-14 DIAGNOSIS — R55 Syncope and collapse: Secondary | ICD-10-CM | POA: Diagnosis not present

## 2023-03-14 DIAGNOSIS — Z880 Allergy status to penicillin: Secondary | ICD-10-CM

## 2023-03-14 DIAGNOSIS — M109 Gout, unspecified: Secondary | ICD-10-CM | POA: Diagnosis present

## 2023-03-14 DIAGNOSIS — R1312 Dysphagia, oropharyngeal phase: Secondary | ICD-10-CM | POA: Diagnosis not present

## 2023-03-14 DIAGNOSIS — R29716 NIHSS score 16: Secondary | ICD-10-CM | POA: Diagnosis present

## 2023-03-14 DIAGNOSIS — F0280 Dementia in other diseases classified elsewhere without behavioral disturbance: Secondary | ICD-10-CM | POA: Diagnosis not present

## 2023-03-14 DIAGNOSIS — I639 Cerebral infarction, unspecified: Secondary | ICD-10-CM | POA: Diagnosis not present

## 2023-03-14 DIAGNOSIS — G9389 Other specified disorders of brain: Secondary | ICD-10-CM | POA: Diagnosis not present

## 2023-03-14 DIAGNOSIS — Z8673 Personal history of transient ischemic attack (TIA), and cerebral infarction without residual deficits: Secondary | ICD-10-CM

## 2023-03-14 DIAGNOSIS — I959 Hypotension, unspecified: Secondary | ICD-10-CM | POA: Diagnosis present

## 2023-03-14 DIAGNOSIS — G9341 Metabolic encephalopathy: Secondary | ICD-10-CM | POA: Diagnosis present

## 2023-03-14 DIAGNOSIS — R29818 Other symptoms and signs involving the nervous system: Secondary | ICD-10-CM | POA: Diagnosis not present

## 2023-03-14 DIAGNOSIS — I672 Cerebral atherosclerosis: Secondary | ICD-10-CM | POA: Diagnosis not present

## 2023-03-14 DIAGNOSIS — Z8249 Family history of ischemic heart disease and other diseases of the circulatory system: Secondary | ICD-10-CM

## 2023-03-14 DIAGNOSIS — H538 Other visual disturbances: Secondary | ICD-10-CM | POA: Diagnosis present

## 2023-03-14 DIAGNOSIS — F1011 Alcohol abuse, in remission: Secondary | ICD-10-CM | POA: Diagnosis present

## 2023-03-14 DIAGNOSIS — I680 Cerebral amyloid angiopathy: Secondary | ICD-10-CM | POA: Diagnosis present

## 2023-03-14 DIAGNOSIS — F0390 Unspecified dementia without behavioral disturbance: Secondary | ICD-10-CM | POA: Diagnosis present

## 2023-03-14 DIAGNOSIS — F1111 Opioid abuse, in remission: Secondary | ICD-10-CM | POA: Diagnosis present

## 2023-03-14 DIAGNOSIS — I63532 Cerebral infarction due to unspecified occlusion or stenosis of left posterior cerebral artery: Secondary | ICD-10-CM | POA: Diagnosis not present

## 2023-03-14 DIAGNOSIS — E854 Organ-limited amyloidosis: Secondary | ICD-10-CM | POA: Diagnosis present

## 2023-03-14 DIAGNOSIS — R279 Unspecified lack of coordination: Secondary | ICD-10-CM | POA: Diagnosis not present

## 2023-03-14 DIAGNOSIS — R2689 Other abnormalities of gait and mobility: Secondary | ICD-10-CM | POA: Diagnosis not present

## 2023-03-14 DIAGNOSIS — R4182 Altered mental status, unspecified: Secondary | ICD-10-CM | POA: Diagnosis not present

## 2023-03-14 DIAGNOSIS — H5340 Unspecified visual field defects: Secondary | ICD-10-CM | POA: Diagnosis present

## 2023-03-14 DIAGNOSIS — R41841 Cognitive communication deficit: Secondary | ICD-10-CM | POA: Diagnosis not present

## 2023-03-14 DIAGNOSIS — H5347 Heteronymous bilateral field defects: Secondary | ICD-10-CM | POA: Diagnosis present

## 2023-03-14 DIAGNOSIS — Z8261 Family history of arthritis: Secondary | ICD-10-CM

## 2023-03-14 DIAGNOSIS — R404 Transient alteration of awareness: Secondary | ICD-10-CM | POA: Diagnosis not present

## 2023-03-14 DIAGNOSIS — Z823 Family history of stroke: Secondary | ICD-10-CM

## 2023-03-14 DIAGNOSIS — E860 Dehydration: Secondary | ICD-10-CM | POA: Diagnosis present

## 2023-03-14 DIAGNOSIS — Z8603 Personal history of neoplasm of uncertain behavior: Secondary | ICD-10-CM

## 2023-03-14 DIAGNOSIS — Z79899 Other long term (current) drug therapy: Secondary | ICD-10-CM

## 2023-03-14 DIAGNOSIS — E162 Hypoglycemia, unspecified: Secondary | ICD-10-CM

## 2023-03-14 DIAGNOSIS — M6281 Muscle weakness (generalized): Secondary | ICD-10-CM | POA: Diagnosis not present

## 2023-03-14 DIAGNOSIS — R569 Unspecified convulsions: Secondary | ICD-10-CM | POA: Diagnosis not present

## 2023-03-14 DIAGNOSIS — Z833 Family history of diabetes mellitus: Secondary | ICD-10-CM

## 2023-03-14 DIAGNOSIS — R9089 Other abnormal findings on diagnostic imaging of central nervous system: Secondary | ICD-10-CM | POA: Diagnosis not present

## 2023-03-14 DIAGNOSIS — Z8679 Personal history of other diseases of the circulatory system: Secondary | ICD-10-CM

## 2023-03-14 DIAGNOSIS — Z950 Presence of cardiac pacemaker: Secondary | ICD-10-CM

## 2023-03-14 DIAGNOSIS — Z860101 Personal history of adenomatous and serrated colon polyps: Secondary | ICD-10-CM

## 2023-03-14 DIAGNOSIS — Z888 Allergy status to other drugs, medicaments and biological substances status: Secondary | ICD-10-CM

## 2023-03-14 DIAGNOSIS — Z7982 Long term (current) use of aspirin: Secondary | ICD-10-CM

## 2023-03-14 DIAGNOSIS — Z9889 Other specified postprocedural states: Secondary | ICD-10-CM

## 2023-03-14 LAB — DIFFERENTIAL
Abs Immature Granulocytes: 0.04 10*3/uL (ref 0.00–0.07)
Basophils Absolute: 0 10*3/uL (ref 0.0–0.1)
Basophils Relative: 0 %
Eosinophils Absolute: 0 10*3/uL (ref 0.0–0.5)
Eosinophils Relative: 0 %
Immature Granulocytes: 0 %
Lymphocytes Relative: 9 %
Lymphs Abs: 1 10*3/uL (ref 0.7–4.0)
Monocytes Absolute: 0.3 10*3/uL (ref 0.1–1.0)
Monocytes Relative: 2 %
Neutro Abs: 9 10*3/uL — ABNORMAL HIGH (ref 1.7–7.7)
Neutrophils Relative %: 89 %

## 2023-03-14 LAB — URINALYSIS, ROUTINE W REFLEX MICROSCOPIC
Bacteria, UA: NONE SEEN
Bilirubin Urine: NEGATIVE
Glucose, UA: 50 mg/dL — AB
Ketones, ur: NEGATIVE mg/dL
Nitrite: NEGATIVE
Protein, ur: 100 mg/dL — AB
Specific Gravity, Urine: 1.014 (ref 1.005–1.030)
pH: 5 (ref 5.0–8.0)

## 2023-03-14 LAB — APTT: aPTT: 25 s (ref 24–36)

## 2023-03-14 LAB — COMPREHENSIVE METABOLIC PANEL
ALT: 13 U/L (ref 0–44)
AST: 18 U/L (ref 15–41)
Albumin: 3.5 g/dL (ref 3.5–5.0)
Alkaline Phosphatase: 70 U/L (ref 38–126)
Anion gap: 16 — ABNORMAL HIGH (ref 5–15)
BUN: 18 mg/dL (ref 8–23)
CO2: 22 mmol/L (ref 22–32)
Calcium: 10 mg/dL (ref 8.9–10.3)
Chloride: 106 mmol/L (ref 98–111)
Creatinine, Ser: 1.16 mg/dL (ref 0.61–1.24)
GFR, Estimated: >60 mL/min (ref >60)
Glucose, Bld: 181 mg/dL — ABNORMAL HIGH (ref 70–99)
Potassium: 4.1 mmol/L (ref 3.5–5.1)
Sodium: 144 mmol/L (ref 135–145)
Total Bilirubin: 0.9 mg/dL (ref 0.3–1.2)
Total Protein: 6.9 g/dL (ref 6.5–8.1)

## 2023-03-14 LAB — RAPID URINE DRUG SCREEN, HOSP PERFORMED
Amphetamines: NOT DETECTED
Barbiturates: NOT DETECTED
Benzodiazepines: NOT DETECTED
Cocaine: NOT DETECTED
Opiates: NOT DETECTED
Tetrahydrocannabinol: NOT DETECTED

## 2023-03-14 LAB — PROTIME-INR
INR: 1.1 (ref 0.8–1.2)
Prothrombin Time: 14.7 s (ref 11.4–15.2)

## 2023-03-14 LAB — CBC
HCT: 50.3 % (ref 39.0–52.0)
Hemoglobin: 16.5 g/dL (ref 13.0–17.0)
MCH: 31.3 pg (ref 26.0–34.0)
MCHC: 32.8 g/dL (ref 30.0–36.0)
MCV: 95.3 fL (ref 80.0–100.0)
Platelets: 118 10*3/uL — ABNORMAL LOW (ref 150–400)
RBC: 5.28 MIL/uL (ref 4.22–5.81)
RDW: 14.6 % (ref 11.5–15.5)
WBC: 10.3 10*3/uL (ref 4.0–10.5)
nRBC: 0 % (ref 0.0–0.2)

## 2023-03-14 LAB — CBG MONITORING, ED
Glucose-Capillary: 77 mg/dL (ref 70–99)
Glucose-Capillary: 154 mg/dL — ABNORMAL HIGH (ref 70–99)

## 2023-03-14 LAB — ETHANOL: Alcohol, Ethyl (B): <10 mg/dL (ref <10)

## 2023-03-14 NOTE — ED Notes (Signed)
Pt taken to ct scan.

## 2023-03-14 NOTE — ED Triage Notes (Signed)
Pt with complete aphasia.

## 2023-03-14 NOTE — Consult Note (Signed)
NEUROLOGY CONSULT NOTE   Date of service: March 14, 2023 Patient Name: VALENTINO MATUSZEK MRN:  [id number] DOB:  January 31, 1941 Chief Complaint: "aphasia" Requesting Provider: Glyn Ade, MD  History of Present Illness  MILLIARD BORES is a 82 y.o. 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  He was last seen normal at 1415 when his wife left to get a shower. When she saw him after shower, he was passed out in the dining room with eyes open and unresponsive. She called EMS as she was unable to get him to talk.  Glucose was in 90s per EMS and here, was down to 77. He was noted to be aphasic upon arrival to the ED and a code stroke was activated.  A code stroke was activated in the ED. CT Head w/o contrast with no acute abnormalities.  LKW: 1415. Modified rankin score: 2-Slight disability-UNABLE to perform all activities but does not need assistance IV Thrombolysis: not offerd due to cerebral amyloic angiopathy EVT: not offered, low suspicion for stroke.  NIHSS components Score: Comment  1a Level of Conscious 0[]  1[x]  2[]  3[]      1b LOC Questions 0[]  1[]  2[x]       1c LOC Commands 0[]  1[]  2[x]       2 Best Gaze 0[x]  1[]  2[]       3 Visual 0[x]  1[]  2[]  3[]      4 Facial Palsy 0[x]  1[]  2[]  3[]      5a Motor Arm - left 0[]  1[]  2[x]  3[]  4[]  UN[]    5b Motor Arm - Right 0[]  1[]  2[x]  3[]  4[]  UN[]    6a Motor Leg - Left 0[]  1[]  2[x]  3[]  4[]  UN[]    6b Motor Leg - Right 0[]  1[]  2[x]  3[]  4[]  UN[]    7 Limb Ataxia 0[x]  1[]  2[]  3[]  UN[]     8 Sensory 0[x]  1[]  2[]  UN[]      9 Best Language 0[]  1[]  2[x]  3[]      10 Dysarthria 0[]  1[x]  2[]  UN[]      11 Extinct. and Inattention 0[x]  1[]  2[]       TOTAL: 16       ROS  Unable to ascertain due to encephalopathy.  Past History   Past Medical History:   Diagnosis Date   BPH (benign prostatic hyperplasia)    Cerebrovascular disease    Clostridium difficile diarrhea    Congenital anomaly of diaphragm    Elevated PSA    Glaucoma, both eyes    Hemorrhoid    Hepatitis B surface antigen positive    02-20-2011   History of adenomatous polyp of colon    2007, 2009 and 2013  tubular adenoma's   History of alcohol abuse    quit 1963   History of cerebral parenchymal hemorrhage    01/ 2006  left occiptial lobe related to hypertensive crisis   History of CVA (cerebrovascular accident)    09-12-2012  left hippocampus/ amygdala junction and per MRI old white matter infarcts--  per pt residual short- term memory issues   History of fatty infiltration of liver hx visit's at [facility] , last visit 05/ 2014   elvated LFT's ,  via liver bx 2004 related to hx alcohol and drug abuse (quit 1964)   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   HTN (hypertension)  Renal cyst, left    Stroke (HCC)    hx of 3 strokes in past    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    Urethral lesion    urethral mass    Past Surgical History:  Procedure Laterality Date   CARDIOVASCULAR STRESS TEST  05/05/2007   normal nuclear study w/ no ischemia/  normal LV fucntion and wall motion , ef60%   COLONOSCOPY  last one 04-06-2012   CYSTO/  LEFT RETROGRADE PYELOGRAM/ CYTOLOGY WASHINGS/  URETEROSCOPY  03/05/2000   INGUINAL HERNIA REPAIR Bilateral 1965 and 1980's   IR 3D INDEPENDENT WKST  07/31/2022   IR ANGIOGRAM PELVIS SELECTIVE OR SUPRASELECTIVE  07/31/2022   IR ANGIOGRAM SELECTIVE EACH ADDITIONAL VESSEL  07/31/2022   IR ANGIOGRAM SELECTIVE EACH ADDITIONAL VESSEL  07/31/2022   IR ANGIOGRAM SELECTIVE EACH ADDITIONAL VESSEL  07/31/2022   IR ANGIOGRAM SELECTIVE EACH ADDITIONAL VESSEL  07/31/2022   IR EMBO TUMOR ORGAN ISCHEMIA INFARCT INC GUIDE ROADMAPPING  07/31/2022   IR RADIOLOGIST EVAL & MGMT  06/26/2022   IR  RADIOLOGIST EVAL & MGMT  08/08/2022   IR RADIOLOGIST EVAL & MGMT  08/21/2022   IR US GUIDE VASC ACCESS RIGHT  07/31/2022   LAPAROSCOPIC INGUINAL HERNIA WITH UMBILICAL HERNIA Right 06/24/2007   LIVER BIOPSY  1980's and 2004   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   Procedure: PACEMAKER IMPLANT;  Surgeon: Marinus Maw, MD;  Location: [facility] INVASIVE CV LAB;  Service: Cardiovascular;  Laterality: N/A;   SVT ABLATION N/A 11/15/2018   Procedure: SVT ABLATION;  Surgeon: Marinus Maw, MD;  Location: [facility] INVASIVE CV LAB;  Service: Cardiovascular;  Laterality: N/A;   TRANSTHORACIC ECHOCARDIOGRAM  09/13/2012   moderate LVH,  ef 60-65%/     TRANSURETHRAL RESECTION OF BLADDER TUMOR N/A 08/11/2016   Procedure: TRANSURETHRAL RESECTION OF BLADDER TUMOR (TURBT);  Surgeon: Malen Gauze, MD;  Location: [facility];  Service: Urology;  Laterality: N/A;    Family History: Family History  Problem Relation Age of Onset   Rheum arthritis Mother    Diabetes Mother    Stroke Mother    Heart attack Mother    Kidney failure Mother    Heart attack Father    Heart disease Maternal Grandmother    Rheum arthritis Maternal Grandmother    Diabetes Maternal Grandmother    Stroke Maternal Grandmother    Colon cancer Neg Hx     Social History  reports that he quit smoking about 41 years ago. His smoking use included cigarettes. He started smoking about 46 years ago. He has a 5 pack-year smoking history. He has never used smokeless tobacco. He reports that he does not drink alcohol and does not use drugs.  Allergies  Allergen Reactions   Aricept [Donepezil] Other (See Comments)    Dizziness    Levetiracetam Other (See Comments)    AMS   Penicillins Hives   Viagra [Sildenafil] Other (See Comments)    Dizziness     Medications  No current facility-administered medications for this encounter.  Current Outpatient Medications:    allopurinol (ZYLOPRIM) 100 MG tablet, Take 1 tablet (100 mg total)  by mouth daily., Disp: 30 tablet, Rfl: 2   brimonidine (ALPHAGAN) 0.2 % ophthalmic solution, Place 1 drop into both eyes 2 (two) times daily., Disp: 5 mL, Rfl: 12   dorzolamide (TRUSOPT) 2 % ophthalmic solution, Place 1 drop into both eyes 2 (two) times daily., Disp: , Rfl:    dutasteride (AVODART)  0.5 MG capsule, Take 0.5 mg by mouth daily., Disp: , Rfl:    food thickener (SIMPLYTHICK, NECTAR/LEVEL 2/MILDLY THICK,) GEL, Take 1 packet by mouth as needed (PRN)., Disp: , Rfl:    latanoprost (XALATAN) 0.005 % ophthalmic solution, Place 1 drop into both eyes at bedtime. , Disp: , Rfl:    memantine (NAMENDA) 10 MG tablet, Take 20 mg by mouth at bedtime., Disp: , Rfl:    metoprolol succinate (TOPROL-XL) 25 MG 24 hr tablet, Take 0.5 tablets (12.5 mg total) by mouth at bedtime., Disp: 30 tablet, Rfl: 1   Nutritional Supplements (ENSURE ACTIVE HEART HEALTH PO), Take 237 mLs by mouth daily as needed (appetite, nutrition). Clear/apple, Disp: , Rfl:    sertraline (ZOLOFT) 50 MG tablet, Take 25 mg by mouth in the morning., Disp: , Rfl:   Vitals   Vitals:   03/14/23 1731 03/14/23 1732  SpO2:  95%  Weight: 70.3 kg   Height: 5\' 11"  (1.803 m)     Body mass index is 21.62 kg/m.  Physical Exam   Constitutional: Appears thin and frail. Psych: Affect appropriate to situation.  Eyes: No scleral injection.  HENT: No OP obstruction.  Head: Normocephalic.  Cardiovascular: Normal rate and regular rhythm.  Respiratory: Effort normal, non-labored breathing.  GI: Soft.  No distension. There is no tenderness.  Skin: WDI.  Neurologic Examination   Mental status/Cognition: slightly drowsy, oriented to self, but does not answer any more questions. Speech/language: was able to state his name but did not follow any other commands. Cranial nerves:   CN II Pupils equal and reactive to light, makes eye contact to loud voice on both sides   CN III,IV,VI EOM intact, no gaze preference or deviation, no nystagmus    CN V Regards facial touch   CN VII Symmetric facial grimace.   CN VIII Make brief eye contact to speech   CN IX & X Protecting his airway   CN XI -   CN XII midline tongue protrusion   Sensory/Motor:  Muscle bulk: poor, tone normal Antigravity movement noted in all extremities and localizes to proximal pinch in all extremities.  Coordination/Complex Motor:  - Unable to assess.   Labs   CBC: No results for input(s): "WBC", "NEUTROABS", "HGB", "HCT", "MCV", "PLT" in the last 168 hours.  Basic Metabolic Panel:  Lab Results  Component Value Date   NA 141 02/08/2023   K 3.6 02/08/2023   CO2 25 02/08/2023   GLUCOSE 125 (H) 02/08/2023   BUN 16 02/08/2023   CREATININE 1.01 02/08/2023   CALCIUM 9.6 02/08/2023   GFRNONAA >60 02/08/2023   GFRAA 60 (L) 02/28/2020   Lipid Panel:  Lab Results  Component Value Date   LDLCALC 87 09/05/2021   HgbA1c:  Lab Results  Component Value Date   HGBA1C 5.5 09/05/2021   Urine Drug Screen:     Component Value Date/Time   LABOPIA NONE DETECTED 09/04/2021 1153   COCAINSCRNUR NONE DETECTED 09/04/2021 1153   LABBENZ NONE DETECTED 09/04/2021 1153   AMPHETMU NONE DETECTED 09/04/2021 1153   THCU NONE DETECTED 09/04/2021 1153   LABBARB NONE DETECTED 09/04/2021 1153    Alcohol Level     Component Value Date/Time   ETH <10 10/18/2022 1408   INR  Lab Results  Component Value Date   INR 1.1 10/18/2022   APTT  Lab Results  Component Value Date   APTT 26 10/18/2022   AED levels: No results found for: "PHENYTOIN", "ZONISAMIDE", "LAMOTRIGINE", "LEVETIRACETA"  CT Head without contrast(Personally reviewed): CTH was negative for a large hypodensity concerning for a large territory infarct or hyperdensity concerning for an ICH   Impression   ORESTE CASTIGLIONI is a 82 y.o. 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   He has had several similar prior spells with negative extensive workup including LTM EEG and imaging.  Recommendations  Some of his episodes have occurred in the setting of hypoglycemia and or hypotension or significant fluctuations in blood pressure. With his glucose of 77, recommend dextrose and monitoring in the ED. ______________________________________________________________________    Welton Flakes

## 2023-03-14 NOTE — ED Notes (Signed)
Code stroke paged out.

## 2023-03-14 NOTE — Discharge Instructions (Addendum)
Follow with Primary MD [facility], [facility] after discharge from SNF   Activity: As tolerated with Full fall precautions use walker/cane & assistance as needed  Disposition SNF   Diet: Dysphagia 3 with thin liquid   On your next visit with your primary care physician please Get Medicines reviewed and adjusted.   Please request your Prim.MD to go over all Hospital Tests and Procedure/Radiological results at the follow up, please get all Hospital records sent to your Prim MD by signing hospital release before you go home.   If you experience worsening of your admission symptoms, develop shortness of breath, life threatening emergency, suicidal or homicidal thoughts you must seek medical attention immediately by calling 911 or calling your MD immediately  if symptoms less severe.  You Must read complete instructions/literature along with all the possible adverse reactions/side effects for all the Medicines you take and that have been prescribed to you. Take any new Medicines after you have completely understood and accpet all the possible adverse reactions/side effects.   Do not drive, operating heavy machinery, perform activities at heights, swimming or participation in water activities or provide baby sitting services if your were admitted for syncope or siezures until you have seen by Primary MD or a Neurologist and advised to do so again.  Do not drive when taking Pain medications.    Do not take more than prescribed Pain, Sleep and Anxiety Medications  Special Instructions: If you have smoked or chewed Tobacco  in the last 2 yrs please stop smoking, stop any regular Alcohol  and or any Recreational drug use.  Wear Seat belts while driving.   Please note  You were cared for by a hospitalist during your hospital stay. If you have any questions about your discharge medications or the care you received while you were in the hospital after you are discharged, you can call the unit  and asked to speak with the hospitalist on call if the hospitalist that took care of you is not available. Once you are discharged, your primary care physician will handle any further medical issues. Please note that NO REFILLS for any discharge medications will be authorized once you are discharged, as it is imperative that you return to your primary care physician (or establish a relationship with a primary care physician if you do not have one) for your aftercare needs so that they can reassess your need for medications and monitor your lab values.

## 2023-03-14 NOTE — ED Triage Notes (Signed)
Pt was normal around 4:50 pm when wife went to take a shower. When she got out he was sitting at the table completely unresponsive. Pt on arrival has eyes open but not talking.

## 2023-03-14 NOTE — ED Provider Notes (Signed)
[facility] AT [facility] Provider Note   CSN: [id number] Arrival date & time: 03/14/23  1728     History Chief Complaint  Patient presents with   Altered Mental Status    HPI Dustin Hamilton is a 82 y.o. male presenting for chief complaint of altered mental status.  He is completely unresponsive on arrival.  I called his wife for history.  His wife states that he was fine today walking around his normal, communicative his normal morning and when she went to take a shower around 2 15-2 30 he was at his baseline. When she came out of the shower at 450, she found him collapsed at the dining room table.  Eyes are open but he did not have any verbal response to her.  EMS was called he remained without any verbal response. On arrival, he has very poor left-sided response to pain but robust right sided.  He remains aphasic at this time. Per wife his baseline is ambulatory and Oriented to self and place. Patient's recorded medical, surgical, social, medication list and allergies were reviewed in the Snapshot window as part of the initial history.   Review of Systems   Review of Systems  Unable to perform ROS: Dementia    Physical Exam Updated Vital Signs BP 102/65   Pulse 96   Temp 98.2 F (36.8 C) (Oral)   Resp 17   Ht 5\' 11"  (1.803 m)   Wt 70.3 kg   SpO2 96%   BMI 21.62 kg/m  Physical Exam Vitals and nursing note reviewed.  Constitutional:      General: He is not in acute distress.    Appearance: He is well-developed.  HENT:     Head: Normocephalic and atraumatic.  Eyes:     Conjunctiva/sclera: Conjunctivae normal.  Cardiovascular:     Rate and Rhythm: Normal rate and regular rhythm.     Heart sounds: No murmur heard. Pulmonary:     Effort: Pulmonary effort is normal. No respiratory distress.     Breath sounds: Normal breath sounds.  Abdominal:     Palpations: Abdomen is soft.     Tenderness: There is no abdominal tenderness.   Musculoskeletal:        General: No swelling.     Cervical back: Neck supple.  Skin:    General: Skin is warm and dry.     Capillary Refill: Capillary refill takes less than 2 seconds.  Neurological:     Mental Status: He is alert.     Comments: Completely aphasic.  Decreased response to pain in left upper extremity.  Not following commands.  Psychiatric:        Mood and Affect: Mood normal.      ED Course/ Medical Decision Making/ A&P    Procedures .Critical Care  Performed by: Glyn Ade, MD Authorized by: Glyn Ade, MD   Critical care provider statement:    Critical care time (minutes):  95   Critical care was necessary to treat or prevent imminent or life-threatening deterioration of the following conditions:  CNS failure or compromise   Critical care was time spent personally by me on the following activities:  Development of treatment plan with patient or surrogate, discussions with consultants, evaluation of patient's response to treatment, examination of patient, ordering and review of laboratory studies, ordering and review of radiographic studies, ordering and performing treatments and interventions, pulse oximetry, re-evaluation of patient's condition and review of old charts  Medications Ordered in ED Medications - No data to display Medical Decision Making:    Dustin Hamilton is a 83 y.o. male  who presented to the ED today with aphasia.  Due to these symptoms, I activated a CODE STROKE per hospital protocol.   Patient placed on continuous vitals and telemetry monitoring while in ED which was reviewed periodically.   On my initial exam, the pt was in no acute distress, glucose was 77 and deficits include aphasia and ?LUE weakness.  Deficits are  persistent.   Reviewed and confirmed nursing documentation for past medical history, family history, social history.    Initial Assessment and Plan:   Patient immediately evaluated jointly by  neurology and emergency department providers.   This is most consistent with an acute life/limb threatening illness complicated by underlying chronic conditions. Patient evaluated per code stroke protocol with immediate cross-sectional imaging of the head via CT head to evaluate for intracranial hemorrhage.  Per neurology, these rapid studies revealed no acute pathology. Neurology feels that patient's presentation is more consistent with alternative pathology given these findings.  Differential includes metabolic encephalopathy, medication encephalopathy, infectious encephalopathy. Neurology has recommended metabolic/infectious workup with laboratory evaluation per EMR.  Initial Study Results: Labs Labs reviewed without evidence of clinically relevant abnormality. EKG EKG was reviewed independently. Rate, rhythm, axis, intervals all examined and without medically relevant abnormality. ST segments without concerns for elevations.    Radiology  Images reviewed independently, agree with radiology report at this time.   CT HEAD CODE STROKE WO CONTRAST  Final Result        Final Assessment and Plan:   Patient's history of present on his physicals and findings revealed no acute pathology today.  After 5 hours of observation emergency room.  I repeated his exam and with his daughter at bedside he has returned to his normal mental status.  I do not believe that patient requires any further care management the emergency room as he is back to his normal mental status.  Uncertain if it was from poor p.o. intake and hypoglycemia that resolved after p.o. intake in the emergency room or if it is from alternative etiology.  Does not appear to be infectious in nature lab work is otherwise at baseline.  Does not appear to be intracranial neurologic pathology.  He does have severe dementia which limits ability to collect a meaningful history. However had a long shared medical decision making conversation with  daughter at bedside and she feels comfortable with supportive care at this time to allow him to return home and rest overnight with repeat exam in the morning and if he has recurrence without the mental status they will pursue further workup.  No acute indication for further intervention at this time patient discharged without acute event.   Clinical Impression:  1. Aphasia   2. Altered mental status, unspecified altered mental status type      Discharge   Final Clinical Impression(s) / ED Diagnoses Final diagnoses:  Aphasia  Altered mental status, unspecified altered mental status type    Rx / DC Orders ED Discharge Orders     None         Glyn Ade, MD 03/14/23 2250

## 2023-03-15 ENCOUNTER — Observation Stay (HOSPITAL_COMMUNITY): Payer: Non-veteran care

## 2023-03-15 ENCOUNTER — Other Ambulatory Visit: Payer: Self-pay

## 2023-03-15 ENCOUNTER — Encounter (HOSPITAL_COMMUNITY): Payer: Self-pay | Admitting: Internal Medicine

## 2023-03-15 DIAGNOSIS — N390 Urinary tract infection, site not specified: Secondary | ICD-10-CM

## 2023-03-15 DIAGNOSIS — R4182 Altered mental status, unspecified: Secondary | ICD-10-CM

## 2023-03-15 DIAGNOSIS — R569 Unspecified convulsions: Secondary | ICD-10-CM | POA: Diagnosis not present

## 2023-03-15 DIAGNOSIS — E162 Hypoglycemia, unspecified: Secondary | ICD-10-CM

## 2023-03-15 DIAGNOSIS — R4701 Aphasia: Secondary | ICD-10-CM | POA: Diagnosis not present

## 2023-03-15 DIAGNOSIS — R55 Syncope and collapse: Secondary | ICD-10-CM | POA: Diagnosis not present

## 2023-03-15 DIAGNOSIS — M109 Gout, unspecified: Secondary | ICD-10-CM

## 2023-03-15 DIAGNOSIS — H409 Unspecified glaucoma: Secondary | ICD-10-CM

## 2023-03-15 HISTORY — DX: Altered mental status, unspecified: R41.82

## 2023-03-15 HISTORY — DX: Urinary tract infection, site not specified: N39.0

## 2023-03-15 LAB — ECHOCARDIOGRAM COMPLETE
Height: 71 in
S' Lateral: 2.7 cm
Weight: 2480 [oz_av]

## 2023-03-15 LAB — TROPONIN I (HIGH SENSITIVITY): Troponin I (High Sensitivity): 28 ng/L — ABNORMAL HIGH (ref <18)

## 2023-03-15 LAB — GLUCOSE, CAPILLARY: Glucose-Capillary: 89 mg/dL (ref 70–99)

## 2023-03-15 LAB — CBG MONITORING, ED
Glucose-Capillary: 161 mg/dL — ABNORMAL HIGH (ref 70–99)
Glucose-Capillary: 94 mg/dL (ref 70–99)
Glucose-Capillary: 117 mg/dL — ABNORMAL HIGH (ref 70–99)

## 2023-03-15 MED ORDER — DORZOLAMIDE HCL 2 % OP SOLN
1.0000 [drp] | Freq: Two times a day (BID) | OPHTHALMIC | Status: DC
  Administered 2023-03-15 – 2023-03-21 (×13): 1 [drp] via OPHTHALMIC
  Filled 2023-03-15: qty 10

## 2023-03-15 MED ORDER — ENOXAPARIN SODIUM 40 MG/0.4ML IJ SOSY
40.0000 mg | PREFILLED_SYRINGE | INTRAMUSCULAR | Status: DC
  Administered 2023-03-15 – 2023-03-17 (×3): 40 mg via SUBCUTANEOUS
  Filled 2023-03-15 (×3): qty 0.4

## 2023-03-15 MED ORDER — MEMANTINE HCL 10 MG PO TABS
10.0000 mg | ORAL_TABLET | Freq: Two times a day (BID) | ORAL | Status: DC
  Administered 2023-03-15 – 2023-03-21 (×14): 10 mg via ORAL
  Filled 2023-03-15 (×14): qty 1

## 2023-03-15 MED ORDER — ACETAMINOPHEN 325 MG PO TABS
650.0000 mg | ORAL_TABLET | Freq: Four times a day (QID) | ORAL | Status: DC | PRN
  Filled 2023-03-15: qty 2

## 2023-03-15 MED ORDER — ACETAMINOPHEN 650 MG RE SUPP: 650.0000 mg | Freq: Four times a day (QID) | RECTAL | Status: DC | PRN

## 2023-03-15 MED ORDER — SODIUM CHLORIDE 0.9 % IV SOLN
1.0000 g | Freq: Once | INTRAVENOUS | Status: AC
  Administered 2023-03-15: 1 g via INTRAVENOUS
  Filled 2023-03-15: qty 10

## 2023-03-15 MED ORDER — PERFLUTREN LIPID MICROSPHERE
1.0000 mL | INTRAVENOUS | Status: AC | PRN
  Administered 2023-03-15: 3 mL via INTRAVENOUS

## 2023-03-15 MED ORDER — ALLOPURINOL 100 MG PO TABS
100.0000 mg | ORAL_TABLET | Freq: Every day | ORAL | Status: DC
  Administered 2023-03-15 – 2023-03-21 (×7): 100 mg via ORAL
  Filled 2023-03-15 (×7): qty 1

## 2023-03-15 MED ORDER — BRIMONIDINE TARTRATE 0.2 % OP SOLN
1.0000 [drp] | Freq: Two times a day (BID) | OPHTHALMIC | Status: DC
  Administered 2023-03-15 – 2023-03-21 (×13): 1 [drp] via OPHTHALMIC
  Filled 2023-03-15: qty 5

## 2023-03-15 MED ORDER — LATANOPROST 0.005 % OP SOLN
1.0000 [drp] | Freq: Every day | OPHTHALMIC | Status: DC
  Administered 2023-03-15 – 2023-03-21 (×7): 1 [drp] via OPHTHALMIC
  Filled 2023-03-15: qty 2.5

## 2023-03-15 MED ORDER — DUTASTERIDE 0.5 MG PO CAPS
0.5000 mg | ORAL_CAPSULE | Freq: Every day | ORAL | Status: DC
  Administered 2023-03-15 – 2023-03-21 (×7): 0.5 mg via ORAL
  Filled 2023-03-15 (×7): qty 1

## 2023-03-15 MED ORDER — LACTATED RINGERS IV BOLUS
500.0000 mL | Freq: Once | INTRAVENOUS | Status: AC
  Administered 2023-03-15: 500 mL via INTRAVENOUS

## 2023-03-15 MED ORDER — ENSURE ENLIVE PO LIQD
237.0000 mL | Freq: Two times a day (BID) | ORAL | Status: DC
  Administered 2023-03-15 – 2023-03-21 (×11): 237 mL via ORAL

## 2023-03-15 MED ORDER — SODIUM CHLORIDE 0.9 % IV SOLN
1.0000 g | INTRAVENOUS | Status: DC
  Administered 2023-03-15 – 2023-03-20 (×5): 1 g via INTRAVENOUS
  Filled 2023-03-15 (×5): qty 10

## 2023-03-15 NOTE — ED Notes (Signed)
Pt back from CT

## 2023-03-15 NOTE — Progress Notes (Signed)
EEG complete - results pending 

## 2023-03-15 NOTE — Evaluation (Signed)
Occupational Therapy Evaluation Patient Details Name: Dustin Hamilton MRN: [id number] DOB: Jul 21, 1940 Today's Date: 03/15/2023   History of Present Illness The pt is an 82 yo male presenting 10/12 with aphasia and AMS. CT with no acute abnormalities. Pt with significant fluctuations in BP, and with BG to 70 on admission, UA also with possible UTI. Pt admitted for management. PMH includes: dementia, glaucoma, L occipital lobe parenchymal hemorrhage, L hippocampus/amygdala infarct, hx of alcohol and IV drug use, HTN, pacemaker, recurrent C. Difficile, BPH.   Clinical Impression   Pt  needing assist with ADLs at baseline from spouse and/or aide who comes 3 hrs/day, 4 days/week. Pt was using rollator for mobility and ambulating short household distances. Pt currently needing min-total A for ADLs, mod A +2 for bed mobility and max +2 for transfers with 2 person HHA. Pt with decr vision, glaucoma at baseline, but appears to only identify items in L visual field with head turn. Pt presenting with impairments listed below, will follow acutely. Patient will benefit from continued inpatient follow up therapy, <3 hours/day to maximize safety/ind with ADLs/functional mobility.       If plan is discharge home, recommend the following: Two people to help with walking and/or transfers;A lot of help with bathing/dressing/bathroom;Assistance with cooking/housework;Direct supervision/assist for medications management;Direct supervision/assist for financial management;Assist for transportation;Supervision due to cognitive status;Help with stairs or ramp for entrance    Functional Status Assessment  Patient has had a recent decline in their functional status and demonstrates the ability to make significant improvements in function in a reasonable and predictable amount of time.  Equipment Recommendations  Other (comment) (defer)    Recommendations for Other Services PT consult     Precautions /  Restrictions Precautions Precautions: Fall Precaution Comments: low vision Restrictions Weight Bearing Restrictions: No      Mobility Bed Mobility Overal bed mobility: Needs Assistance Bed Mobility: Supine to Sit, Sit to Supine     Supine to sit: Mod assist, +2 for physical assistance Sit to supine: Mod assist, +2 for physical assistance   General bed mobility comments: pt attempting to assist, needs modA of 2 to manage LE and trunk as well as scooting to EOB, modA of 2 to return to supine    Transfers Overall transfer level: Needs assistance Equipment used: 2 person hand held assist Transfers: Sit to/from Stand Sit to Stand: Max assist, +2 physical assistance           General transfer comment: pt with limited ability to assist, poor power through LE to rise to standing. minatains knees flexed and braced along EOB. very limited extension at hips and trunk, pt's wife reports he is kyphotic but not usually this much      Balance Overall balance assessment: Needs assistance Sitting-balance support: Feet supported, Bilateral upper extremity supported Sitting balance-Leahy Scale: Poor     Standing balance support: Bilateral upper extremity supported, During functional activity Standing balance-Leahy Scale: Poor Standing balance comment: dependent on support from therapists                           ADL either performed or assessed with clinical judgement   ADL Overall ADL's : Needs assistance/impaired Eating/Feeding: Minimal assistance   Grooming: Minimal assistance   Upper Body Bathing: Moderate assistance   Lower Body Bathing: Maximal assistance   Upper Body Dressing : Moderate assistance   Lower Body Dressing: Maximal assistance   Toilet Transfer: Maximal assistance;+2 for  physical assistance   Toileting- Clothing Manipulation and Hygiene: Total assistance       Functional mobility during ADLs: Maximal assistance;+2 for physical assistance        Vision Baseline Vision/History: 2 Legally blind;3 Glaucoma Ability to See in Adequate Light: 3 Highly impaired Patient Visual Report: Peripheral vision impairment Vision Assessment?: Vision impaired- to be further tested in functional context Additional Comments: can see better when wtih light/dark color contrast, pt able to identify numbers and see color on therapists shirt in L peripheral visual field. can track to R but does not sustain gaze to R, also has L head turn preference     Perception Perception: Not tested       Praxis Praxis: Not tested       Pertinent Vitals/Pain Pain Assessment Pain Assessment: No/denies pain     Extremity/Trunk Assessment Upper Extremity Assessment Upper Extremity Assessment: Generalized weakness   Lower Extremity Assessment Lower Extremity Assessment: Defer to PT evaluation   Cervical / Trunk Assessment Cervical / Trunk Assessment: Kyphotic Cervical / Trunk Exceptions: poor core strength and coordination   Communication Communication Communication: Difficulty following commands/understanding;Difficulty communicating thoughts/reduced clarity of speech Following commands: Follows one step commands consistently Cueing Techniques: Verbal cues;Gestural cues   Cognition Arousal: Alert Behavior During Therapy: Flat affect Overall Cognitive Status: Impaired/Different from baseline Area of Impairment: Orientation, Attention, Memory, Following commands, Safety/judgement, Awareness, Problem solving                 Orientation Level: Disoriented to, Person Current Attention Level: Focused Memory: Decreased recall of precautions, Decreased short-term memory Following Commands: Follows one step commands inconsistently, Follows one step commands with increased time Safety/Judgement: Decreased awareness of safety, Decreased awareness of deficits Awareness: Intellectual Problem Solving: Slow processing, Requires tactile cues, Requires  verbal cues General Comments: pt with significantly slowed processing, staring off with minimal verbal contribution to conversation. pt unable to state birth day (only month), knew he wa not at home but otherwise unsure of where he wa or why. significantly increased time and repeated cues, but pt still only inconsistently completing tasks.     General Comments  VSS    Exercises     Shoulder Instructions      Home Living Family/patient expects to be discharged to:: Private residence Living Arrangements: Spouse/significant other Available Help at Discharge: Family;Available 24 hours/day Type of Home: House Home Access: Ramped entrance     Home Layout: Two level;Full bath on main level Alternate Level Stairs-Number of Steps: stair   Bathroom Shower/Tub: Producer, television/film/video: Standard Bathroom Accessibility: Yes   Home Equipment: Educational psychologist (4 wheels);Gilmer Mor - single point   Additional Comments: aide x3 hours in the AM for 4 days/week, M, W, Th, Sun      Prior Functioning/Environment Prior Level of Function : Needs assist             Mobility Comments: was getting HHPT, was using rollator ADLs Comments: aide assists with bathing, meals, wears briefs at baseline        OT Problem List: Decreased strength;Decreased range of motion;Decreased activity tolerance;Impaired balance (sitting and/or standing);Impaired vision/perception;Decreased coordination;Decreased cognition;Decreased safety awareness;Decreased knowledge of use of DME or AE      OT Treatment/Interventions: Self-care/ADL training;Therapeutic exercise;Energy conservation;DME and/or AE instruction;Therapeutic activities;Patient/family education;Balance training;Cognitive remediation/compensation;Visual/perceptual remediation/compensation;Neuromuscular education    OT Goals(Current goals can be found in the care plan section) Acute Rehab OT Goals Patient Stated Goal: none stated OT Goal  Formulation: With patient Time  For Goal Achievement: 03/29/23 Potential to Achieve Goals: Good ADL Goals Pt Will Perform Upper Body Dressing: with mod assist;with min assist;sitting;standing Pt Will Perform Lower Body Dressing: with min assist;sit to/from stand;sitting/lateral leans Pt Will Transfer to Toilet: with mod assist;ambulating;regular height toilet Additional ADL Goal #1: pt will perform bed mobility min A in prep for ADLs Additional ADL Goal #2: pt will demonstrate 3 low vision compensatory strategies in order to promote ind with ADLs  OT Frequency: Min 1X/week    Co-evaluation PT/OT/SLP Co-Evaluation/Treatment: Yes Reason for Co-Treatment: Complexity of the patient's impairments (multi-system involvement);Necessary to address cognition/behavior during functional activity;For patient/therapist safety;To address functional/ADL transfers PT goals addressed during session: Balance;Mobility/safety with mobility;Strengthening/ROM OT goals addressed during session: ADL's and self-care;Strengthening/ROM      AM-PAC OT "6 Clicks" Daily Activity     Outcome Measure Help from another person eating meals?: A Little Help from another person taking care of personal grooming?: A Little Help from another person toileting, which includes using toliet, bedpan, or urinal?: Total Help from another person bathing (including washing, rinsing, drying)?: A Lot Help from another person to put on and taking off regular upper body clothing?: A Lot Help from another person to put on and taking off regular lower body clothing?: A Lot 6 Click Score: 13   End of Session Equipment Utilized During Treatment: Gait belt Nurse Communication: Mobility status  Activity Tolerance: Patient tolerated treatment well Patient left: in bed;with call bell/phone within reach;with family/visitor present  OT Visit Diagnosis: Unsteadiness on feet (R26.81);Other abnormalities of gait and mobility (R26.89);Muscle weakness  (generalized) (M62.81);Other symptoms and signs involving the nervous system (R29.898);Other symptoms and signs involving cognitive function;Cognitive communication deficit (R41.841);Low vision, both eyes (H54.2)                Time: 0865-7846 OT Time Calculation (min): 23 min Charges:  OT General Charges $OT Visit: 1 Visit OT Evaluation $OT Eval Moderate Complexity: 1 Mod  Nahara Dona K, OTD, OTR/L [employer] Preferred Acute Rehab [phone]   Reisa Coppola K Koonce 03/15/2023, 9:57 AM

## 2023-03-15 NOTE — ED Notes (Signed)
NAD noted upon transport to IP floor.

## 2023-03-15 NOTE — Progress Notes (Signed)
Neurology Progress Note  Brief HPI: 82 y.o. 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  Presented with acute onset of aphasia.  Patient's wife saw patient normal on the morning of 10/12 Following her shower, found patient unresponsive in the dining room with his eyes open, unable to speak, not following commands.  On arrival, patient's blood glucose was 77 with improvement following oral intake.  CT head obtained without acute  Subjective: -Patient's wife at bedside notes that patient's symptoms have greatly improved since presentation but notes that he has ongoing difficulties with word finding and confusion worse than baseline.  Patient's wife states that this is the longest time frame that these symptoms have ever occurred at one time.  Patient is also able to state that the symptoms are prolonged from his usual similar "episodes".  - CT head code stroke without acute intracranial hemorrhage or evidence of acute LVO.  Aspects of 10.  Exam: Vitals:   03/15/23 0745 03/15/23 1128  BP: 134/86 134/81  Pulse: 66 62  Resp: 18 17  Temp: 97.7 F (36.5 C)   SpO2: 95% 96%   Gen: Laying in ER stretcher, in no acute distress Resp: non-labored breathing, no respiratory distress Abd: soft, nt  Neuro: Mental Status: Awake, alert, oriented to self.  Patient is unable to state his current age, year, month, or location.  At baseline, patient is not oriented to time. Patient does have word finding difficulties.  When shown the phone, patient states my finger repeatedly.  After repeat attempts, he was able to state done.  Patient is unable to identify little finger but repeatedly states "finger". Patient follows simple commands. Speech is nonfluent, 1-2 word answers. Cranial Nerves: PERRL, tracks examiner to the left and right, peripheral visual field deficit bilaterally  (chronic per patient's wife), facial sensation is intact and symmetric to light touch, facial movement is symmetric, hearing intact to voice, shoulder shrug symmetrically, midline tongue protrusion. Motor: Patient is able to elevate bilateral upper extremities antigravity without vertical drift.  Bilateral lower extremities elevate antigravity though with some symmetrical weakness noted.  Sensory: Symmetric to light touch throughout Gait: Deferred  Pertinent Labs: CBC    Component Value Date/Time   WBC 10.3 03/14/2023 1920   RBC 5.28 03/14/2023 1920   HGB 16.5 03/14/2023 1920   HGB 17.2 11/12/2018 0950   HCT 50.3 03/14/2023 1920   HCT 52.0 (H) 11/12/2018 0950   PLT 118 (L) 03/14/2023 1920   PLT 144 (L) 11/12/2018 0950   MCV 95.3 03/14/2023 1920   MCV 92 11/12/2018 0950   MCH 31.3 03/14/2023 1920   MCHC 32.8 03/14/2023 1920   RDW 14.6 03/14/2023 1920   RDW 13.7 11/12/2018 0950   LYMPHSABS 1.0 03/14/2023 1920   LYMPHSABS 1.3 11/12/2018 0950   MONOABS 0.3 03/14/2023 1920   EOSABS 0.0 03/14/2023 1920   EOSABS 0.1 11/12/2018 0950   BASOSABS 0.0 03/14/2023 1920   BASOSABS 0.0 11/12/2018 0950   CMP     Component Value Date/Time   NA 144 03/14/2023 1920   NA 143 11/12/2018 0950   K 4.1 03/14/2023 1920   CL 106 03/14/2023 1920   CO2 22 03/14/2023 1920   GLUCOSE 181 (H) 03/14/2023 1920   BUN 18 03/14/2023 1920   BUN 15 11/12/2018 0950   CREATININE 1.16 03/14/2023 1920   CALCIUM 10.0 03/14/2023 1920   PROT 6.9 03/14/2023 1920  ALBUMIN 3.5 03/14/2023 1920   AST 18 03/14/2023 1920   ALT 13 03/14/2023 1920   ALKPHOS 70 03/14/2023 1920   BILITOT 0.9 03/14/2023 1920   GFR 102.85 05/05/2012 0826   GFRNONAA >60 03/14/2023 1920   Urinalysis    Component Value Date/Time   COLORURINE YELLOW 03/14/2023 1935   APPEARANCEUR HAZY (A) 03/14/2023 1935   LABSPEC 1.014 03/14/2023 1935   PHURINE 5.0 03/14/2023 1935   GLUCOSEU 50 (A) 03/14/2023 1935   GLUCOSEU NEGATIVE 02/17/2011 0930    HGBUR MODERATE (A) 03/14/2023 1935   BILIRUBINUR NEGATIVE 03/14/2023 1935   KETONESUR NEGATIVE 03/14/2023 1935   PROTEINUR 100 (A) 03/14/2023 1935   UROBILINOGEN 1.0 09/12/2012 1802   NITRITE NEGATIVE 03/14/2023 1935   LEUKOCYTESUR MODERATE (A) 03/14/2023 1935   Imaging Reviewed: CT head 10/13:  No acute intracranial hemorrhage or evidence of acute large vessel territory infarct. ASPECT score is 10. Unchanged background of severe chronic small-vessel disease.  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  On chart review, patient was noted to have several similar spells in the past with negative extensive workup including LTM EEG and neuroimaging.  Due to the prolonged nature of patient's symptoms and compared to past episodes, will obtain MRI brain and routine EEG for further evaluation. There is additional concern for symptoms in the setting of hypoglycemia and hypotension though patient's postoperative blood glucose since arrival 77.  Lowest documented hypotensive event during current evaluation documented as 84/68 mmHg at 00:33 this morning.  Recommendations: -MRI brain without contrast -Routine EEG -Management of comorbid conditions per primary team as you are  Lanae Boast, AGACNP-BC [facility] [phone]  NEUROHOSPITALIST ADDENDUM Performed a face to face diagnostic evaluation.   I have reviewed the contents of history and physical exam as documented by PA/ARNP/Resident and agree with above documentation.  I have discussed and formulated the above plan as documented. Edits to the note have been made as needed.  Impression/Key exam findings/Plan: Has had extensive workup for these in the past. Per wife, this is the longest spell he has had so will get a rEEG and MRI brain but if that is normal, no  further workup. He is improving per discussion with his wife.  Erick Blinks, MD [facility] [phone]   If 7pm to 7am, please call on call as listed on AMION.

## 2023-03-15 NOTE — ED Notes (Addendum)
Received phone call from Mickey Farber with [employer], there was no reportable issues with patients pacemaker.

## 2023-03-15 NOTE — Hospital Course (Addendum)
Taken from H&P.  Dustin Hamilton is a 82 y.o. 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  He was completely unresponsive on arrival.  Last known at baseline was around 2:30 PM when wife went to take a shower, around 4:50 PM wife found him unresponsive at the dining room table.  EMS was called.On arrival, he had very poor left-sided response to pain and remained aphasic. Code stroke was activated. Glucose was 77 on arrival to the ED. CT head negative for acute intracranial abnormality. Neurology felt that patient has had several similar prior spells with negative extensive workup including LTM EEG and imaging. Neurology felt that this episode was likely related to hypoglycemia or hypotension or significant fluctuations in blood pressure. Blood pressure initially elevated on arrival to the ED and later dropped to systolic in the 80s.   Patient's mental status improved while he was in the ED and he became more awake and alert, able to speak. He was given 500 mL IV fluids with improvement of blood pressure. Blood glucose improved to 160s after oral intake.   Vitals and labs stable.  UA with some pyuria, no bacteria-cultures pending Patient was started on ceftriaxone.   Admitting provider also requested pacemaker device interrogation, received message from [employer] that there was no reportable issues with pacemaker.  Per neurology patient had multiple similar episodes which occurred with very mild hypoglycemia or some fluctuation in blood pressure.

## 2023-03-15 NOTE — Procedures (Signed)
Patient Name: Dustin Hamilton  MRN: [id number]  Epilepsy Attending: Charlsie Quest  Referring Physician/Provider: Kara Mead, NP  Date: 03/15/2023 Duration: 26.05 mins  Patient history: 82yo m with ams getting eeg to evaluate for seizure  Level of alertness: Awake, asleep  AEDs during EEG study: None  Technical aspects: This EEG study was done with scalp electrodes positioned according to the 10-20 International system of electrode placement. Electrical activity was reviewed with band pass filter of 1-70Hz , sensitivity of 7 uV/mm, display speed of 38mm/sec with a 60Hz  notched filter applied as appropriate. EEG data were recorded continuously and digitally stored.  Video monitoring was available and reviewed as appropriate.  Description: The posterior dominant rhythm consists of 8 Hz activity of moderate voltage (25-35 uV) seen predominantly in posterior head regions, symmetric and reactive to eye opening and eye closing. Sleep was characterized by vertex waves, sleep spindles (12 to 14 Hz), maximal frontocentral region. EEG showed intermittent generalized 5 to 6 Hz theta slowing. Hyperventilation and photic stimulation were not performed.     ABNORMALITY - Intermittent slow, generalized  IMPRESSION: This study is suggestive of mild diffuse encephalopathy. No seizures or epileptiform discharges were seen throughout the recording.  [name]

## 2023-03-15 NOTE — ED Notes (Signed)
All pt fall risk precautions are in place

## 2023-03-15 NOTE — ED Notes (Signed)
ED TO INPATIENT HANDOFF REPORT  ED Nurse Name and Phone #: Osvaldo Shipper RN [phone]  S Name/Age/Gender Dustin Hamilton 82 y.o. male Room/Bed: 039C/039C  Code Status   Code Status: Full Code  Home/SNF/Other Home Patient oriented to: self Is this baseline? Yes   Triage Complete: Triage complete  Chief Complaint AMS (altered mental status) [R41.82]  Triage Note Pt was normal around 4:50 pm when wife went to take a shower. When she got out he was sitting at the table completely unresponsive. Pt on arrival has eyes open but not talking.  Pt with complete aphasia.   Allergies Allergies  Allergen Reactions   Aricept [Donepezil] Other (See Comments)    Dizziness    Levetiracetam Other (See Comments)    AMS   Penicillins Hives   Viagra [Sildenafil] Other (See Comments)    Dizziness     Level of Care/Admitting Diagnosis ED Disposition     ED Disposition  Admit   Condition  --   Comment  Hospital Area: [facility] [[id number]]  Level of Care: Progressive [102]  Admit to Progressive based on following criteria: MULTISYSTEM THREATS such as stable sepsis, metabolic/electrolyte imbalance with or without encephalopathy that is responding to early treatment.  May place patient in observation at [facility] or [facility] if equivalent level of care is available:: Yes  Covid Evaluation: Asymptomatic - no recent exposure (last 10 days) testing not required  Diagnosis: AMS (altered mental status) [[id number]]  Admitting Physician: John Giovanni [[id number]]  Attending Physician: John Giovanni [[id number]]          B Medical/Surgery History Past Medical History:  Diagnosis Date   BPH (benign prostatic hyperplasia)    Cerebrovascular disease    Clostridium difficile diarrhea    Congenital anomaly of diaphragm    Elevated PSA    Glaucoma, both eyes    Hemorrhoid    Hepatitis B surface antigen positive    02-20-2011   History of adenomatous polyp of  colon    2007, 2009 and 2013  tubular adenoma's   History of alcohol abuse    quit 1963   History of cerebral parenchymal hemorrhage    01/ 2006  left occiptial lobe related to hypertensive crisis   History of CVA (cerebrovascular accident)    09-12-2012  left hippocampus/ amygdala junction and per MRI old white matter infarcts--  per pt residual short- term memory issues   History of fatty infiltration of liver hx visit's at [facility] , last visit 05/ 2014   elvated LFT's ,  via liver bx 2004 related to hx alcohol and drug abuse (quit 1964)   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   HTN (hypertension)    Renal cyst, left    Stroke (HCC)    hx of 3 strokes in past    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    Urethral lesion    urethral mass   Past Surgical History:  Procedure Laterality Date   CARDIOVASCULAR STRESS TEST  05/05/2007   normal nuclear study w/ no ischemia/  normal LV fucntion and wall motion , ef60%   COLONOSCOPY  last one 04-06-2012   CYSTO/  LEFT RETROGRADE PYELOGRAM/ CYTOLOGY WASHINGS/  URETEROSCOPY  03/05/2000   INGUINAL HERNIA REPAIR Bilateral 1965 and 1980's   IR 3D INDEPENDENT WKST  07/31/2022   IR ANGIOGRAM PELVIS SELECTIVE OR SUPRASELECTIVE  07/31/2022   IR ANGIOGRAM  SELECTIVE EACH ADDITIONAL VESSEL  07/31/2022   IR ANGIOGRAM SELECTIVE EACH ADDITIONAL VESSEL  07/31/2022   IR ANGIOGRAM SELECTIVE EACH ADDITIONAL VESSEL  07/31/2022   IR ANGIOGRAM SELECTIVE EACH ADDITIONAL VESSEL  07/31/2022   IR EMBO TUMOR ORGAN ISCHEMIA INFARCT INC GUIDE ROADMAPPING  07/31/2022   IR RADIOLOGIST EVAL & MGMT  06/26/2022   IR RADIOLOGIST EVAL & MGMT  08/08/2022   IR RADIOLOGIST EVAL & MGMT  08/21/2022   IR US GUIDE VASC ACCESS RIGHT  07/31/2022   LAPAROSCOPIC INGUINAL HERNIA WITH UMBILICAL HERNIA Right 06/24/2007   LIVER BIOPSY  1980's and 2004   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   Procedure: PACEMAKER IMPLANT;  Surgeon:  Marinus Maw, MD;  Location: [facility];  Service: Cardiovascular;  Laterality: N/A;   SVT ABLATION N/A 11/15/2018   Procedure: SVT ABLATION;  Surgeon: Marinus Maw, MD;  Location: [facility];  Service: Cardiovascular;  Laterality: N/A;   TRANSTHORACIC ECHOCARDIOGRAM  09/13/2012   moderate LVH,  ef 60-65%/     TRANSURETHRAL RESECTION OF BLADDER TUMOR N/A 08/11/2016   Procedure: TRANSURETHRAL RESECTION OF BLADDER TUMOR (TURBT);  Surgeon: Malen Gauze, MD;  Location: [facility];  Service: Urology;  Laterality: N/A;     A IV Location/Drains/Wounds Patient Lines/Drains/Airways Status     Active Line/Drains/Airways     Name Placement date Placement time Site Days   Peripheral IV 03/14/23 20 G 1" Left;Posterior Hand 03/14/23  1652  Hand  1            Intake/Output Last 24 hours No intake or output data in the 24 hours ending 03/15/23 0854  Labs/Imaging Results for orders placed or performed during the hospital encounter of 03/14/23 (from the past 48 hour(s))  CBG monitoring, ED     Status: None   Collection Time: 03/14/23  5:32 PM  Result Value Ref Range   Glucose-Capillary 77 70 - 99 mg/dL    Comment: Glucose reference range applies only to samples taken after fasting for at least 8 hours.  CBG monitoring, ED     Status: Abnormal   Collection Time: 03/14/23  6:36 PM  Result Value Ref Range   Glucose-Capillary 154 (H) 70 - 99 mg/dL    Comment: Glucose reference range applies only to samples taken after fasting for at least 8 hours.  Ethanol     Status: None   Collection Time: 03/14/23  7:20 PM  Result Value Ref Range   Alcohol, Ethyl (B) <10 <10 mg/dL    Comment: (NOTE) Lowest detectable limit for serum alcohol is 10 mg/dL.  For medical purposes only. Performed at [facility], [address] [place], [place] [address]   Protime-INR     Status: None   Collection Time: 03/14/23  7:20 PM  Result Value Ref Range    Prothrombin Time 14.7 11.4 - 15.2 seconds   INR 1.1 0.8 - 1.2    Comment: (NOTE) INR goal varies based on device and disease states. Performed at [facility], [address], [place], [place] [address]   APTT     Status: None   Collection Time: 03/14/23  7:20 PM  Result Value Ref Range   aPTT 25 24 - 36 seconds    Comment: Performed at [facility], [address] [place], [place] [address]  CBC     Status: Abnormal   Collection Time: 03/14/23  7:20 PM  Result Value Ref Range  WBC 10.3 4.0 - 10.5 K/uL   RBC 5.28 4.22 - 5.81 MIL/uL   Hemoglobin 16.5 13.0 - 17.0 g/dL   HCT 16.1 09.6 - 04.5 %   MCV 95.3 80.0 - 100.0 fL   MCH 31.3 26.0 - 34.0 pg   MCHC 32.8 30.0 - 36.0 g/dL   RDW 40.9 81.1 - 91.4 %   Platelets 118 (L) 150 - 400 K/uL   nRBC 0.0 0.0 - 0.2 %    Comment: Performed at [facility], [address] [place], [place] [address]  Differential     Status: Abnormal   Collection Time: 03/14/23  7:20 PM  Result Value Ref Range   Neutrophils Relative % 89 %   Neutro Abs 9.0 (H) 1.7 - 7.7 K/uL   Lymphocytes Relative 9 %   Lymphs Abs 1.0 0.7 - 4.0 K/uL   Monocytes Relative 2 %   Monocytes Absolute 0.3 0.1 - 1.0 K/uL   Eosinophils Relative 0 %   Eosinophils Absolute 0.0 0.0 - 0.5 K/uL   Basophils Relative 0 %   Basophils Absolute 0.0 0.0 - 0.1 K/uL   Immature Granulocytes 0 %   Abs Immature Granulocytes 0.04 0.00 - 0.07 K/uL    Comment: Performed at [facility], [address], [place], [place] [address]  Comprehensive metabolic panel     Status: Abnormal   Collection Time: 03/14/23  7:20 PM  Result Value Ref Range   Sodium 144 135 - 145 mmol/L   Potassium 4.1 3.5 - 5.1 mmol/L   Chloride 106 98 - 111 mmol/L   CO2 22 22 - 32 mmol/L   Glucose, Bld 181 (H) 70 - 99 mg/dL    Comment: Glucose reference range applies only to samples taken after fasting for at least 8 hours.   BUN 18 8 - 23 mg/dL   Creatinine, Ser 8.65 0.61 - 1.24 mg/dL   Calcium  78.4 8.9 - 69.6 mg/dL   Total Protein 6.9 6.5 - 8.1 g/dL   Albumin 3.5 3.5 - 5.0 g/dL   AST 18 15 - 41 U/L   ALT 13 0 - 44 U/L   Alkaline Phosphatase 70 38 - 126 U/L   Total Bilirubin 0.9 0.3 - 1.2 mg/dL   GFR, Estimated >29 >52 mL/min    Comment: (NOTE) Calculated using the CKD-EPI Creatinine Equation (2021)    Anion gap 16 (H) 5 - 15    Comment: Performed at [facility], [address] [place], [place] [address]  Urine rapid drug screen (hosp performed)     Status: None   Collection Time: 03/14/23  7:35 PM  Result Value Ref Range   Opiates NONE DETECTED NONE DETECTED   Cocaine NONE DETECTED NONE DETECTED   Benzodiazepines NONE DETECTED NONE DETECTED   Amphetamines NONE DETECTED NONE DETECTED   Tetrahydrocannabinol NONE DETECTED NONE DETECTED   Barbiturates NONE DETECTED NONE DETECTED    Comment: (NOTE) DRUG SCREEN FOR MEDICAL PURPOSES ONLY.  IF CONFIRMATION IS NEEDED FOR ANY PURPOSE, NOTIFY LAB WITHIN 5 DAYS.  LOWEST DETECTABLE LIMITS FOR URINE DRUG SCREEN Drug Class                     Cutoff (ng/mL) Amphetamine and metabolites    1000 Barbiturate and metabolites    200 Benzodiazepine                 200 Opiates and metabolites  300 Cocaine and metabolites        300 THC                            50 Performed at [facility], [address], [place], [place] [address]   Urinalysis, Routine w reflex microscopic -Urine, Clean Catch     Status: Abnormal   Collection Time: 03/14/23  7:35 PM  Result Value Ref Range   Color, Urine YELLOW YELLOW   APPearance HAZY (A) CLEAR   Specific Gravity, Urine 1.014 1.005 - 1.030   pH 5.0 5.0 - 8.0   Glucose, UA 50 (A) NEGATIVE mg/dL   Hgb urine dipstick MODERATE (A) NEGATIVE   Bilirubin Urine NEGATIVE NEGATIVE   Ketones, ur NEGATIVE NEGATIVE mg/dL   Protein, ur 604 (A) NEGATIVE mg/dL   Nitrite NEGATIVE NEGATIVE   Leukocytes,Ua MODERATE (A) NEGATIVE   RBC / HPF 21-50 0 - 5 RBC/hpf   WBC, UA 21-50 0 - 5  WBC/hpf   Bacteria, UA NONE SEEN NONE SEEN   Squamous Epithelial / HPF 0-5 0 - 5 /HPF   Mucus PRESENT    Hyaline Casts, UA PRESENT     Comment: Performed at [facility], [address], [place], [place] [address]  CBG monitoring, ED     Status: Abnormal   Collection Time: 03/15/23  1:28 AM  Result Value Ref Range   Glucose-Capillary 161 (H) 70 - 99 mg/dL    Comment: Glucose reference range applies only to samples taken after fasting for at least 8 hours.  Troponin I (High Sensitivity)     Status: Abnormal   Collection Time: 03/15/23  4:58 AM  Result Value Ref Range   Troponin I (High Sensitivity) 28 (H) <18 ng/L    Comment: (NOTE) Elevated high sensitivity troponin I (hsTnI) values and significant  changes across serial measurements may suggest ACS but many other  chronic and acute conditions are known to elevate hsTnI results.  Refer to the "Links" section for chest pain algorithms and additional  guidance. Performed at [facility] [place] [facility], [address], [place], [place] [address]   CBG monitoring, ED     Status: None   Collection Time: 03/15/23  6:01 AM  Result Value Ref Range   Glucose-Capillary 94 70 - 99 mg/dL    Comment: Glucose reference range applies only to samples taken after fasting for at least 8 hours.   DG CHEST PORT 1 VIEW  Result Date: 03/15/2023 CLINICAL DATA:  82 year old male with history of altered mental status. EXAM: PORTABLE CHEST 1 VIEW COMPARISON:  Chest x-ray 10/18/2022. FINDINGS: Severe elevation of the right hemidiaphragm again noted. Atelectasis and/or scarring in the base of the right lung. Left lung is clear. No pleural effusions. No pneumothorax. No evidence of pulmonary edema. Heart size is normal. Upper mediastinal contours are within normal limits. Left-sided pacemaker device in place with lead tips projecting over the expected location of the right atrium and right ventricle. Atherosclerotic calcifications in the thoracic aorta. IMPRESSION:  1. No radiographic evidence of acute cardiopulmonary disease. 2. Severe chronic elevation of the right hemidiaphragm again noted, with increasing atelectasis and/or scarring in the right lung base. 3. Aortic atherosclerosis. Electronically Signed   By: Trudie Reed M.D.   On: 03/15/2023 05:04   CT HEAD CODE STROKE WO CONTRAST  Result Date: 03/14/2023 CLINICAL DATA:  Code stroke. Neuro deficit, acute, stroke suspected. EXAM: CT HEAD  WITHOUT CONTRAST TECHNIQUE: Contiguous axial images were obtained from the base of the skull through the vertex without intravenous contrast. RADIATION DOSE REDUCTION: This exam was performed according to the departmental dose-optimization program which includes automated exposure control, adjustment of the mA and/or kV according to patient size and/or use of iterative reconstruction technique. COMPARISON:  Head CT 10/18/2022. FINDINGS: Brain: No acute hemorrhage. Unchanged background of severe chronic small-vessel disease. Cortical gray-white differentiation is otherwise preserved. Prominence of the ventricles and sulci within expected range for age. No hydrocephalus or extra-axial collection. No mass effect or midline shift. Vascular: No hyperdense vessel or unexpected calcification. Skull: No calvarial fracture or suspicious bone lesion. Skull base is unremarkable. Sinuses/Orbits: No acute finding. Other: None. ASPECTS [facility] Stroke Program Early CT Score) - Ganglionic level infarction (caudate, lentiform nuclei, internal capsule, insula, M1-M3 cortex): 7 - Supraganglionic infarction (M4-M6 cortex): 3 Total score (0-10 with 10 being normal): 10 IMPRESSION: 1. No acute intracranial hemorrhage or evidence of acute large vessel territory infarct. ASPECT score is 10. 2. Unchanged background of severe chronic small-vessel disease. Code stroke imaging results were communicated on 03/14/2023 at 6:17 pm to provider Dr. Derry Lory via secure text paging. Electronically Signed   By:  Orvan Falconer M.D.   On: 03/14/2023 18:17    Pending Labs Unresulted Labs (From admission, onward)     Start     Ordered   03/15/23 0432  Urine Culture (for pregnant, neutropenic or urologic patients or patients with an indwelling urinary catheter)  (Urine Labs)  Add-on,   AD       Question:  Indication  Answer:  Dysuria   03/15/23 0433            Vitals/Pain Today's Vitals   03/15/23 0125 03/15/23 0431 03/15/23 0603 03/15/23 0745  BP: 113/79 (!) 141/85  134/86  Pulse: 73 69  66  Resp: 15 12  18   Temp:   97.7 F (36.5 C) 97.7 F (36.5 C)  TempSrc:    Oral  SpO2: 96% 95%  95%  Weight:      Height:      PainSc:    0-No pain    Isolation Precautions No active isolations  Medications Medications  cefTRIAXone (ROCEPHIN) 1 g in sodium chloride 0.9 % 100 mL IVPB (has no administration in time range)  allopurinol (ZYLOPRIM) tablet 100 mg (has no administration in time range)  memantine (NAMENDA) tablet 10 mg (has no administration in time range)  dutasteride (AVODART) capsule 0.5 mg (has no administration in time range)  brimonidine (ALPHAGAN) 0.2 % ophthalmic solution 1 drop (has no administration in time range)  dorzolamide (TRUSOPT) 2 % ophthalmic solution 1 drop (has no administration in time range)  latanoprost (XALATAN) 0.005 % ophthalmic solution 1 drop (has no administration in time range)  enoxaparin (LOVENOX) injection 40 mg (has no administration in time range)  acetaminophen (TYLENOL) tablet 650 mg (has no administration in time range)    Or  acetaminophen (TYLENOL) suppository 650 mg (has no administration in time range)  lactated ringers bolus 500 mL (0 mLs Intravenous Stopped 03/15/23 0448)  cefTRIAXone (ROCEPHIN) 1 g in sodium chloride 0.9 % 100 mL IVPB (0 g Intravenous Stopped 03/15/23 0141)    Mobility walks with person assist     Focused Assessments Neuro Assessment Handoff:  Swallow screen pass? Yes  Cardiac Rhythm: Normal sinus rhythm NIH  Stroke Scale  Interval: Shift assessment Level of Consciousness (1a.)   : Alert, keenly responsive LOC Questions (1b. )   :  Answers one question correctly LOC Commands (1c. )   : Performs neither task correctly Best Gaze (2. 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 )  : Bilateral hemianopia (blind including cortical blindness) Facial Palsy (4. )    : Normal symmetrical movements Motor Arm, Left (5a. )   : No drift Motor Arm, Right (5b. ) : No drift Motor Leg, Left (6a. )  : No drift Motor Leg, Right (6b. ) : No drift Limb Ataxia (7. ): Present in two limbs Sensory (8. )  : Normal, no sensory loss Best Language (9. )  : Mild-to-moderate aphasia Dysarthria (10. ): Normal Extinction/Inattention (11.)   : Visual/tactile/auditory/spatial/personal inattention Complete NIHSS TOTAL: 11     Neuro Assessment: Exceptions to WDL (left side gaze) Neuro Checks:   Initial (03/14/23 1741)  Has TPA been given? No If patient is a Neuro Trauma and patient is going to OR before floor call report to 4N Charge nurse: [phone] or [phone]   R Recommendations: See Admitting Provider Note  Report given to:   Additional Notes:

## 2023-03-15 NOTE — H&P (Signed)
History and Physical    Dustin Hamilton MVH:[id number] DOB: [birth year] DOA: 03/14/2023  PCP: [facility]  Patient coming from: Home  Chief Complaint: Unresponsiveness  HPI: Dustin Hamilton is a 82 y.o. 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  He was completely unresponsive on arrival.  His wife told ED physician that she went to take a shower around 2:15-2:30 PM and at that time patient was at his baseline.  When she checked on the patient at 4:50 PM, he was found unresponsive/collapsed at the dining room table.  His eyes were open but he did not have any verbal response.  EMS was called.  On arrival, he had very poor left-sided response to pain and remained aphasic.  Code stroke was activated.  Glucose was 77 on arrival to the ED.  CT head negative for acute intracranial abnormality.  Neurology felt that patient has had several similar prior spells with negative extensive workup including LTM EEG and imaging.  Neurology felt that this episode was likely related to hypoglycemia or hypotension or significant fluctuations in blood pressure.  Blood pressure initially elevated on arrival to the ED and later dropped to systolic in the 80s.  Patient's mental status improved while he was in the ED and he became more awake and alert, able to speak.  He was given 500 mL IV fluids with improvement of blood pressure.  Blood glucose improved to 160s after oral intake.  Afebrile and labs showing no leukocytosis.  Hemoglobin normal.  Platelet count 118k, chronically low and stable.  Blood ethanol level undetectable, UDS negative.  UA with negative nitrite,  moderate leukocytes, and microscopy showing 21-50 RBCs, 21-50 WBCs, and no bacteria. Patient was given ceftriaxone for possible UTI.  EKG without acute ischemic changes.  Patient currently awake and alert but appears confused and not able to give any history.  He has no complaints.  Denies dizziness, chest pain, shortness of breath, nausea, vomiting, abdominal/flank/back pain, diarrhea, or any urinary symptoms.  Wife states patient has dementia but at baseline he is able to ambulate with a walker and is oriented to person and place.   She went to take a shower around 2:15 or 2:30 PM and at that time patient was at his baseline.  When she returned to check on him 2 hours later, he was found unresponsive, slumped over in a chair.  Wife states patient's eyes were closed at that time and he did not have any verbal response.  EMS was called.  Patient's last meal was lunch at noon.  Wife states he has been eating his meals but his appetite has declined over time.  Otherwise he was doing well up until this event yesterday and not having any illness.  Review of Systems:  Review of Systems  All other systems reviewed and are negative.   Past Medical History:  Diagnosis Date   BPH (benign prostatic hyperplasia)    Cerebrovascular disease    Clostridium difficile diarrhea    Congenital anomaly of diaphragm    Elevated PSA    Glaucoma, both eyes    Hemorrhoid    Hepatitis B surface antigen positive    02-20-2011   History of adenomatous polyp of colon  2007, 2009 and 2013  tubular adenoma's   History of alcohol abuse    quit 1963   History of cerebral parenchymal hemorrhage    01/ 2006  left occiptial lobe related to hypertensive crisis   History of CVA (cerebrovascular accident)    09-12-2012  left hippocampus/ amygdala junction and per MRI old white matter infarcts--  per pt residual short- term memory issues   History of fatty infiltration of liver hx visit's at [facility] , last visit 05/  2014   elvated LFT's ,  via liver bx 2004 related to hx alcohol and drug abuse (quit 1964)   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   HTN (hypertension)    Renal cyst, left    Stroke (HCC)    hx of 3 strokes in past    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    Urethral lesion    urethral mass    Past Surgical History:  Procedure Laterality Date   CARDIOVASCULAR STRESS TEST  05/05/2007   normal nuclear study w/ no ischemia/  normal LV fucntion and wall motion , ef60%   COLONOSCOPY  last one 04-06-2012   CYSTO/  LEFT RETROGRADE PYELOGRAM/ CYTOLOGY WASHINGS/  URETEROSCOPY  03/05/2000   INGUINAL HERNIA REPAIR Bilateral 1965 and 1980's   IR 3D INDEPENDENT WKST  07/31/2022   IR ANGIOGRAM PELVIS SELECTIVE OR SUPRASELECTIVE  07/31/2022   IR ANGIOGRAM SELECTIVE EACH ADDITIONAL VESSEL  07/31/2022   IR ANGIOGRAM SELECTIVE EACH ADDITIONAL VESSEL  07/31/2022   IR ANGIOGRAM SELECTIVE EACH ADDITIONAL VESSEL  07/31/2022   IR ANGIOGRAM SELECTIVE EACH ADDITIONAL VESSEL  07/31/2022   IR EMBO TUMOR ORGAN ISCHEMIA INFARCT INC GUIDE ROADMAPPING  07/31/2022   IR RADIOLOGIST EVAL & MGMT  06/26/2022   IR RADIOLOGIST EVAL & MGMT  08/08/2022   IR RADIOLOGIST EVAL & MGMT  08/21/2022   IR US GUIDE VASC ACCESS RIGHT  07/31/2022   LAPAROSCOPIC INGUINAL HERNIA WITH UMBILICAL HERNIA Right 06/24/2007   LIVER BIOPSY  1980's and 2004   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   Procedure: PACEMAKER IMPLANT;  Surgeon: Marinus Maw, MD;  Location: [facility];  Service: Cardiovascular;  Laterality: N/A;   SVT ABLATION N/A 11/15/2018   Procedure: SVT ABLATION;  Surgeon: Marinus Maw, MD;  Location: [facility];  Service: Cardiovascular;  Laterality: N/A;   TRANSTHORACIC ECHOCARDIOGRAM  09/13/2012   moderate LVH,  ef 60-65%/     TRANSURETHRAL RESECTION OF BLADDER TUMOR N/A 08/11/2016   Procedure: TRANSURETHRAL RESECTION OF BLADDER TUMOR (TURBT);  Surgeon: Malen Gauze, MD;  Location: [facility];  Service: Urology;  Laterality: N/A;     reports that he quit smoking about 41 years ago. His smoking use included cigarettes. He started smoking about 46 years ago. He has a 5 pack-year smoking history. He has never used smokeless tobacco. He reports that he does not drink alcohol and does not use drugs.  Allergies  Allergen Reactions   Aricept [Donepezil] Other (See Comments)    Dizziness    Levetiracetam Other (See Comments)    AMS   Penicillins Hives   Viagra [Sildenafil] Other (See Comments)    Dizziness     Family History  Problem Relation Age of Onset   Rheum arthritis Mother    Diabetes Mother    Stroke Mother    Heart attack Mother    Kidney  failure Mother    Heart attack Father    Heart disease Maternal Grandmother    Rheum arthritis Maternal Grandmother    Diabetes Maternal Grandmother    Stroke Maternal Grandmother    Colon cancer Neg Hx     Prior to Admission medications   Medication Sig Start Date End Date Taking? Authorizing Provider  allopurinol (ZYLOPRIM) 100 MG tablet Take 1 tablet (100 mg total) by mouth daily. 04/05/21 03/14/24 Yes Pokhrel, Laxman, MD  brimonidine (ALPHAGAN) 0.2 % ophthalmic solution Place 1 drop into both eyes 2 (two) times daily. 10/19/22  Yes Lyndle Herrlich, MD  dorzolamide (TRUSOPT) 2 % ophthalmic solution Place 1 drop into both eyes 2 (two) times daily.   Yes [provider]  dutasteride (AVODART) 0.5 MG capsule Take 0.5 mg by mouth daily.   Yes [provider]  latanoprost (XALATAN) 0.005 % ophthalmic solution Place 1 drop into both eyes at bedtime.    Yes [provider]  memantine (NAMENDA) 10 MG tablet Take 10 mg by mouth 2 (two) times daily.   Yes [provider]  metoprolol succinate (TOPROL-XL) 25 MG 24 hr tablet Take 0.5 tablets (12.5 mg total) by mouth at bedtime. 05/31/20  Yes Lilland, Alana, DO  Nutritional Supplements (ENSURE  ACTIVE HEART HEALTH PO) Take 237 mLs by mouth 2 (two) times daily as needed (appetite, nutrition). Clear/apple   Yes [provider]    Physical Exam: Vitals:   03/14/23 2330 03/15/23 0000 03/15/23 0033 03/15/23 0125  BP: (!) 135/93 108/73 (!) 84/68 113/79  Pulse: 87 60  73  Resp: 14 12 16 15   Temp:      TempSrc:      SpO2: 96% 96% 95% 96%  Weight:      Height:        Physical Exam Vitals reviewed.  Constitutional:      General: He is not in acute distress. HENT:     Head: Normocephalic and atraumatic.  Eyes:     Extraocular Movements: Extraocular movements intact.  Cardiovascular:     Rate and Rhythm: Normal rate and regular rhythm.     Pulses: Normal pulses.  Pulmonary:     Effort: Pulmonary effort is normal. No respiratory distress.     Breath sounds: Normal breath sounds. No wheezing or rales.  Abdominal:     General: Bowel sounds are normal. There is no distension.     Palpations: Abdomen is soft.     Tenderness: There is no abdominal tenderness. There is no guarding.  Musculoskeletal:     Cervical back: Normal range of motion.     Right lower leg: No edema.     Left lower leg: No edema.  Skin:    General: Skin is warm and dry.  Neurological:     General: No focal deficit present.     Mental Status: He is alert.     Cranial Nerves: No cranial nerve deficit.     Sensory: No sensory deficit.     Motor: No weakness.     Comments: Confused, slow to respond to questions Oriented to self and knows that he is at a hospital Following commands appropriately     Labs on Admission: I have personally reviewed following labs and imaging studies  CBC: Recent Labs  Lab 03/14/23 1920  WBC 10.3  NEUTROABS 9.0*  HGB 16.5  HCT 50.3  MCV 95.3  PLT 118*   Basic Metabolic Panel: Recent Labs  Lab 03/14/23 1920  NA  144  K 4.1  CL 106  CO2 22  GLUCOSE 181*  BUN 18  CREATININE 1.16  CALCIUM 10.0   GFR: Estimated Creatinine Clearance: 48.8 mL/min (by  C-G formula based on SCr of 1.16 mg/dL). Liver Function Tests: Recent Labs  Lab 03/14/23 1920  AST 18  ALT 13  ALKPHOS 70  BILITOT 0.9  PROT 6.9  ALBUMIN 3.5   No results for input(s): "LIPASE", "AMYLASE" in the last 168 hours. No results for input(s): "AMMONIA" in the last 168 hours. Coagulation Profile: Recent Labs  Lab 03/14/23 1920  INR 1.1   Cardiac Enzymes: No results for input(s): "CKTOTAL", "CKMB", "CKMBINDEX", "TROPONINI" in the last 168 hours. BNP (last 3 results) No results for input(s): "PROBNP" in the last 8760 hours. HbA1C: No results for input(s): "HGBA1C" in the last 72 hours. CBG: Recent Labs  Lab 03/14/23 1732 03/14/23 1836 03/15/23 0128  GLUCAP 77 154* 161*   Lipid Profile: No results for input(s): "CHOL", "HDL", "LDLCALC", "TRIG", "CHOLHDL", "LDLDIRECT" in the last 72 hours. Thyroid Function Tests: No results for input(s): "TSH", "T4TOTAL", "FREET4", "T3FREE", "THYROIDAB" in the last 72 hours. Anemia Panel: No results for input(s): "VITAMINB12", "FOLATE", "FERRITIN", "TIBC", "IRON", "RETICCTPCT" in the last 72 hours. Urine analysis:    Component Value Date/Time   COLORURINE YELLOW 03/14/2023 1935   APPEARANCEUR HAZY (A) 03/14/2023 1935   LABSPEC 1.014 03/14/2023 1935   PHURINE 5.0 03/14/2023 1935   GLUCOSEU 50 (A) 03/14/2023 1935   GLUCOSEU NEGATIVE 02/17/2011 0930   HGBUR MODERATE (A) 03/14/2023 1935   BILIRUBINUR NEGATIVE 03/14/2023 1935   KETONESUR NEGATIVE 03/14/2023 1935   PROTEINUR 100 (A) 03/14/2023 1935   UROBILINOGEN 1.0 09/12/2012 1802   NITRITE NEGATIVE 03/14/2023 1935   LEUKOCYTESUR MODERATE (A) 03/14/2023 1935    Radiological Exams on Admission: CT HEAD CODE STROKE WO CONTRAST  Result Date: 03/14/2023 CLINICAL DATA:  Code stroke. Neuro deficit, acute, stroke suspected. EXAM: CT HEAD WITHOUT CONTRAST TECHNIQUE: Contiguous axial images were obtained from the base of the skull through the vertex without intravenous contrast.  RADIATION DOSE REDUCTION: This exam was performed according to the departmental dose-optimization program which includes automated exposure control, adjustment of the mA and/or kV according to patient size and/or use of iterative reconstruction technique. COMPARISON:  Head CT 10/18/2022. FINDINGS: Brain: No acute hemorrhage. Unchanged background of severe chronic small-vessel disease. Cortical gray-white differentiation is otherwise preserved. Prominence of the ventricles and sulci within expected range for age. No hydrocephalus or extra-axial collection. No mass effect or midline shift. Vascular: No hyperdense vessel or unexpected calcification. Skull: No calvarial fracture or suspicious bone lesion. Skull base is unremarkable. Sinuses/Orbits: No acute finding. Other: None. ASPECTS [facility] Stroke Program Early CT Score) - Ganglionic level infarction (caudate, lentiform nuclei, internal capsule, insula, M1-M3 cortex): 7 - Supraganglionic infarction (M4-M6 cortex): 3 Total score (0-10 with 10 being normal): 10 IMPRESSION: 1. No acute intracranial hemorrhage or evidence of acute large vessel territory infarct. ASPECT score is 10. 2. Unchanged background of severe chronic small-vessel disease. Code stroke imaging results were communicated on 03/14/2023 at 6:17 pm to provider Dr. Derry Lory via secure text paging. Electronically Signed   By: Orvan Falconer M.D.   On: 03/14/2023 18:17    EKG: Independently reviewed.  Accelerated junctional rhythm, no significant change compared to previous EKG.  Assessment and Plan  Altered mental status/unresponsiveness Blood ethanol level undetectable, UDS negative.  EKG without acute ischemic changes.  CT head negative for acute intracranial abnormality. Neurology felt that patient  has had several similar prior spells with negative extensive workup including LTM EEG and imaging.  Neurology felt that this episode was likely related to hypoglycemia or hypotension or significant  fluctuations in blood pressure.  Blood pressure initially elevated on arrival to the ED and later dropped to systolic in the 80s, now improved after 500 mL IV fluids.  Also his blood glucose was initially in the 70s, now improved to 160s after oral intake.  Would not expect unresponsiveness with borderline hypoglycemia.  Patient currently awake and alert but confused and slow to respond to questions.  He is not back to his baseline.  Continue cardiac monitoring.  Spoke to ED RN and I have requested his pacemaker to be interrogated.  Chest x-ray and echocardiogram ordered.  Check troponin.  Hypotension Blood pressure now improved after 500 mL IV fluids.  Systolic currently in the 110s.  Workup showing possible UTI but no signs of sepsis.  EKG without acute ischemic changes.  PE less likely given no chest pain, shortness breath, tachycardia, or hypoxia.  Hemoglobin stable and no signs of bleeding.  Hold antihypertensives at this time and monitor blood pressure closely.  Borderline hypoglycemia Glucose now improved after oral intake.  Patient is not on any diabetes medications, last A1c 5.5 in April 2023.  Suspect hypoglycemia is due to poor p.o. intake.  Continue CBG checks every 6 hours and encourage p.o. intake.  ?UTI UA with negative nitrite, moderate leukocytes, and microscopy showing 21-50 RBCs, 21-50 WBCs, and no bacteria.  No fever or leukocytosis.  Patient is not having any urinary symptoms, abdominal, or flank pain.  Continue ceftriaxone at this time and order urine culture.  BPH status post prostate artery embolization Continue dutasteride.  Gout Continue allopurinol.  Chronic mild thrombocytopenia Platelet count stable and no signs of bleeding.  Dementia Continue Namenda.  Glaucoma Continue home eyedrops.  DVT prophylaxis: Lovenox Code Status: Full Code (discussed with the patient's wife) Family Communication: Wife at bedside. Level of care: Progressive Care Unit Admission  status: It is my clinical opinion that referral for OBSERVATION is reasonable and necessary in this patient based on the above information provided. The aforementioned taken together are felt to place the patient at high risk for further clinical deterioration. However, it is anticipated that the patient may be medically stable for discharge from the hospital within 24 to 48 hours.  John Giovanni MD [facility]  If 7PM-7AM, please contact night-coverage [email or web address]  03/15/2023, 3:07 AM

## 2023-03-15 NOTE — ED Notes (Signed)
IP provider at bedside.

## 2023-03-15 NOTE — Evaluation (Signed)
Physical Therapy Evaluation Patient Details Name: Dustin Hamilton MRN: [id number] DOB: [birth year] Today's Date: 03/15/2023  History of Present Illness  The pt is an 82 yo male presenting 10/12 with aphasia and AMS. CT with no acute abnormalities. Pt with significant fluctuations in BP, and with BG to 70 on admission, UA also with possible UTI. Pt admitted for management. PMH includes: dementia, glaucoma, L occipital lobe parenchymal hemorrhage, L hippocampus/amygdala infarct, hx of alcohol and IV drug use, HTN, pacemaker, recurrent C. Difficile, BPH.   Clinical Impression  Pt in bed upon arrival of PT, agreeable to evaluation at this time. Prior to admission the pt was ambulating 20-50 ft with assist of wife or aide with use of rollator in the home, wife reports he was able to feed himself but did rely on assist for bathing and dressing. The pt presents with limitations in functional mobility, awareness, problem solving, processing, motor planning, strength, power, and stability due to above dx, and will continue to benefit from skilled PT to address these deficits. He required modA of 2 to complete bed mobility and maxA of 2 to attempt sit-stand at EOB. Pt with very poor power through BLE to rise to standing, unable to achieve hip or trunk extension despite cues and assist, and was leaning backwards with both legs braced on bed for support. The pt was unable to manage any steps at this time, needs much more assistance with all mobility than wife is able to provide. Recommend continued skilled PT to address functional deficits and post-acute rehab <3hours/day prior to return home with family support.          If plan is discharge home, recommend the following: Two people to help with walking and/or transfers;Two people to help with bathing/dressing/bathroom;Assistance with cooking/housework;Direct supervision/assist for medications management;Direct supervision/assist for financial management;Assist  for transportation;Help with stairs or ramp for entrance;Supervision due to cognitive status   Can travel by private vehicle   No    Equipment Recommendations Wheelchair (measurements PT);Wheelchair cushion (measurements PT)  Recommendations for Other Services       Functional Status Assessment Patient has had a recent decline in their functional status and demonstrates the ability to make significant improvements in function in a reasonable and predictable amount of time.     Precautions / Restrictions Precautions Precautions: Fall Restrictions Weight Bearing Restrictions: No      Mobility  Bed Mobility Overal bed mobility: Needs Assistance Bed Mobility: Supine to Sit, Sit to Supine     Supine to sit: Mod assist, +2 for physical assistance Sit to supine: Mod assist, +2 for physical assistance   General bed mobility comments: pt attempting to assist, needs modA of 2 to manage LE and trunk as well as scooting to EOB, modA of 2 to return to supine    Transfers Overall transfer level: Needs assistance Equipment used: 2 person hand held assist Transfers: Sit to/from Stand Sit to Stand: Max assist, +2 physical assistance           General transfer comment: pt with limited ability to assist, poor power through LE to rise to standing. minatains knees flexed and braced along EOB. very limited extension at hips and trunk, pt's wife reports he is kyphotic but not usually this much    Ambulation/Gait               General Gait Details: deferred due to pt inability to complete stand    Balance Overall balance assessment: Needs assistance Sitting-balance support:  Feet supported, Bilateral upper extremity supported Sitting balance-Leahy Scale: Poor     Standing balance support: Bilateral upper extremity supported, During functional activity Standing balance-Leahy Scale: Poor Standing balance comment: dependent on support from therapists                              Pertinent Vitals/Pain Pain Assessment Pain Assessment: No/denies pain    Home Living Family/patient expects to be discharged to:: (P) Private residence Living Arrangements: (P) Spouse/significant other Available Help at Discharge: (P) Family;Available 24 hours/day Type of Home: (P) House Home Access: (P) Ramped entrance     Alternate Level Stairs-Number of Steps: (P) stair Home Layout: (P) Two level;Full bath on main level Home Equipment: (P) Shower seat;Rollator (4 wheels);Cane - single point Additional Comments: (P) aide x3 hours in the AM for 4 days/week, M, W, Th, Sun    Prior Function Prior Level of Function : (P) Needs assist             Mobility Comments: (P) was getting HHPT, was using rollator ADLs Comments: (P) aide assists with bathing, meals,     Extremity/Trunk Assessment   Upper Extremity Assessment Upper Extremity Assessment: Defer to OT evaluation    Lower Extremity Assessment Lower Extremity Assessment: Generalized weakness;Difficult to assess due to impaired cognition (able to move against gravity and light resistance, but poor funcitonal use for bed mobility or sit-stand attempt. limited power, fatigue quickly)    Cervical / Trunk Assessment Cervical / Trunk Assessment: Kyphotic;Other exceptions Cervical / Trunk Exceptions: poor core strength and coordination  Communication   Communication Communication: Difficulty following commands/understanding;Difficulty communicating thoughts/reduced clarity of speech Following commands: Follows one step commands inconsistently;Follows one step commands with increased time Cueing Techniques: Verbal cues;Gestural cues;Tactile cues;Visual cues  Cognition Arousal: Alert Behavior During Therapy: Flat affect Overall Cognitive Status: Impaired/Different from baseline Area of Impairment: Orientation, Attention, Memory, Following commands, Safety/judgement, Awareness, Problem solving                  Orientation Level: Disoriented to, Person, Place, Time, Situation (pt only able to name birth month) Current Attention Level: Focused Memory: Decreased recall of precautions, Decreased short-term memory Following Commands: Follows one step commands inconsistently, Follows one step commands with increased time Safety/Judgement: Decreased awareness of safety, Decreased awareness of deficits Awareness: Intellectual Problem Solving: Slow processing, Requires tactile cues, Requires verbal cues General Comments: pt with significantly slowed processing, staring off with minimal verbal contribution to conversation. pt unable to state birth day (only month), knew he wa not at home but otherwise unsure of where he wa or why. significantly increased time and repeated cues, but pt still only inconsistently completing tasks.        General Comments General comments (skin integrity, edema, etc.): BP stable with SBP 140s        Assessment/Plan    PT Assessment Patient needs continued PT services  PT Problem List Decreased strength;Decreased activity tolerance;Decreased range of motion;Decreased balance;Decreased mobility;Decreased coordination;Decreased cognition;Decreased safety awareness       PT Treatment Interventions DME instruction;Gait training;Functional mobility training;Therapeutic activities;Balance training;Therapeutic exercise;Neuromuscular re-education;Cognitive remediation    PT Goals (Current goals can be found in the Care Plan section)  Acute Rehab PT Goals Patient Stated Goal: none stated by pt, wife hopeful he can return home PT Goal Formulation: With patient/family Time For Goal Achievement: 03/29/23 Potential to Achieve Goals: Good    Frequency Min 1X/week  Co-evaluation PT/OT/SLP Co-Evaluation/Treatment: Yes Reason for Co-Treatment: Complexity of the patient's impairments (multi-system involvement);Necessary to address cognition/behavior during functional  activity;For patient/therapist safety;To address functional/ADL transfers PT goals addressed during session: Balance;Mobility/safety with mobility;Strengthening/ROM         AM-PAC PT "6 Clicks" Mobility  Outcome Measure Help needed turning from your back to your side while in a flat bed without using bedrails?: A Lot Help needed moving from lying on your back to sitting on the side of a flat bed without using bedrails?: Total Help needed moving to and from a bed to a chair (including a wheelchair)?: Total Help needed standing up from a chair using your arms (e.g., wheelchair or bedside chair)?: Total Help needed to walk in hospital room?: Total Help needed climbing 3-5 steps with a railing? : Total 6 Click Score: 7    End of Session Equipment Utilized During Treatment: Gait belt Activity Tolerance: Patient limited by lethargy;Other (comment) (arrival of transport to take pt to echo) Patient left: in bed Nurse Communication: Mobility status;Other (comment) (brief soiled) PT Visit Diagnosis: Unsteadiness on feet (R26.81);Other abnormalities of gait and mobility (R26.89);Muscle weakness (generalized) (M62.81)    Time: 9604-5409 PT Time Calculation (min) (ACUTE ONLY): 22 min   Charges:   PT Evaluation $PT Eval Low Complexity: 1 Low   PT General Charges $$ ACUTE PT VISIT: 1 Visit         Vickki Muff, PT, DPT   Acute Rehabilitation Department Office [phone] Secure Chat Communication Preferred  Ronnie Derby 03/15/2023, 9:19 AM

## 2023-03-15 NOTE — ED Notes (Signed)
Pt to ECHO, NAD noted upon transport. Pt changed into clean bed sheets and brief prior to transport.

## 2023-03-15 NOTE — Progress Notes (Signed)
No charge progress note.  Dustin Hamilton is a 82 y.o. 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  He was completely unresponsive on arrival.  Last known at baseline was around 2:30 PM when wife went to take a shower, around 4:50 PM wife found him unresponsive at the dining room table.  EMS was called.On arrival, he had very poor left-sided response to pain and remained aphasic. Code stroke was activated. Glucose was 77 on arrival to the ED. CT head negative for acute intracranial abnormality. Neurology felt that patient has had several similar prior spells with negative extensive workup including LTM EEG and imaging. Neurology felt that this episode was likely related to hypoglycemia or hypotension or significant fluctuations in blood pressure. Blood pressure initially elevated on arrival to the ED and later dropped to systolic in the 80s.   Patient's mental status improved while he was in the ED and he became more awake and alert, able to speak. He was given 500 mL IV fluids with improvement of blood pressure. Blood glucose improved to 160s after oral intake.   Vitals and labs stable.  UA with some pyuria, no bacteria-cultures pending Patient was started on ceftriaxone.   Admitting provider also requested pacemaker device interrogation, received message from [employer] that there was no reportable issues with pacemaker.  Per neurology patient had multiple similar episodes which occurred with very mild hypoglycemia or some fluctuation in blood pressure.  Patient was appeared much improved, alert and close to baseline.  Per wife he is oriented x 2 at baseline,  this most recent episode was little longer than his prior episodes.  On exam he was a frail elderly man, alert and oriented x 2, able to tell me that he is in hospital but not the name, unable to tell the year which is his baseline.  Getting EEG.  Likely will go home tomorrow

## 2023-03-15 NOTE — ED Provider Notes (Signed)
Patient discharged by previous shift.  He came in with altered mental status, episode of unresponsiveness and aphasia.  He was seen as a code stroke but had a reassuring workup and was cleared by neurology.  Neurology felt patient's presentation was likely more encephalopathic.  On attempted discharge she was found to have a blood pressure of 84 systolic.  Patient is awake and alert and somewhat confused.  Not back to normal.  Wife at bedside.  Patient given IV fluids as well as IV Rocephin for possible UTI.  Wife reports patient has improved but not yet back to baseline.  Given his ongoing mental status changes and hypotension we will plan admission.  Discussed with Dr. Loney Loh.    Dustin Octave, MD 03/15/23 [phone]

## 2023-03-16 DIAGNOSIS — F0280 Dementia in other diseases classified elsewhere without behavioral disturbance: Secondary | ICD-10-CM | POA: Diagnosis not present

## 2023-03-16 DIAGNOSIS — H5340 Unspecified visual field defects: Secondary | ICD-10-CM | POA: Diagnosis present

## 2023-03-16 DIAGNOSIS — G9349 Other encephalopathy: Secondary | ICD-10-CM | POA: Diagnosis present

## 2023-03-16 DIAGNOSIS — Z7401 Bed confinement status: Secondary | ICD-10-CM | POA: Diagnosis not present

## 2023-03-16 DIAGNOSIS — F0390 Unspecified dementia without behavioral disturbance: Secondary | ICD-10-CM | POA: Diagnosis present

## 2023-03-16 DIAGNOSIS — I639 Cerebral infarction, unspecified: Secondary | ICD-10-CM | POA: Diagnosis not present

## 2023-03-16 DIAGNOSIS — F1411 Cocaine abuse, in remission: Secondary | ICD-10-CM | POA: Diagnosis present

## 2023-03-16 DIAGNOSIS — B957 Other staphylococcus as the cause of diseases classified elsewhere: Secondary | ICD-10-CM | POA: Diagnosis present

## 2023-03-16 DIAGNOSIS — N390 Urinary tract infection, site not specified: Secondary | ICD-10-CM | POA: Diagnosis present

## 2023-03-16 DIAGNOSIS — E854 Organ-limited amyloidosis: Secondary | ICD-10-CM | POA: Diagnosis present

## 2023-03-16 DIAGNOSIS — I4892 Unspecified atrial flutter: Secondary | ICD-10-CM | POA: Diagnosis present

## 2023-03-16 DIAGNOSIS — I680 Cerebral amyloid angiopathy: Secondary | ICD-10-CM | POA: Diagnosis present

## 2023-03-16 DIAGNOSIS — F1011 Alcohol abuse, in remission: Secondary | ICD-10-CM | POA: Diagnosis present

## 2023-03-16 DIAGNOSIS — D696 Thrombocytopenia, unspecified: Secondary | ICD-10-CM | POA: Diagnosis present

## 2023-03-16 DIAGNOSIS — M6281 Muscle weakness (generalized): Secondary | ICD-10-CM | POA: Diagnosis not present

## 2023-03-16 DIAGNOSIS — R279 Unspecified lack of coordination: Secondary | ICD-10-CM | POA: Diagnosis not present

## 2023-03-16 DIAGNOSIS — M109 Gout, unspecified: Secondary | ICD-10-CM | POA: Diagnosis present

## 2023-03-16 DIAGNOSIS — N400 Enlarged prostate without lower urinary tract symptoms: Secondary | ICD-10-CM | POA: Diagnosis present

## 2023-03-16 DIAGNOSIS — F1111 Opioid abuse, in remission: Secondary | ICD-10-CM | POA: Diagnosis present

## 2023-03-16 DIAGNOSIS — E162 Hypoglycemia, unspecified: Secondary | ICD-10-CM | POA: Diagnosis present

## 2023-03-16 DIAGNOSIS — I63533 Cerebral infarction due to unspecified occlusion or stenosis of bilateral posterior cerebral arteries: Secondary | ICD-10-CM | POA: Diagnosis present

## 2023-03-16 DIAGNOSIS — H409 Unspecified glaucoma: Secondary | ICD-10-CM | POA: Diagnosis present

## 2023-03-16 DIAGNOSIS — R1312 Dysphagia, oropharyngeal phase: Secondary | ICD-10-CM | POA: Diagnosis not present

## 2023-03-16 DIAGNOSIS — G9341 Metabolic encephalopathy: Secondary | ICD-10-CM | POA: Diagnosis present

## 2023-03-16 DIAGNOSIS — I63532 Cerebral infarction due to unspecified occlusion or stenosis of left posterior cerebral artery: Secondary | ICD-10-CM | POA: Diagnosis not present

## 2023-03-16 DIAGNOSIS — R4701 Aphasia: Secondary | ICD-10-CM | POA: Diagnosis present

## 2023-03-16 DIAGNOSIS — K760 Fatty (change of) liver, not elsewhere classified: Secondary | ICD-10-CM | POA: Diagnosis present

## 2023-03-16 DIAGNOSIS — R531 Weakness: Secondary | ICD-10-CM | POA: Diagnosis not present

## 2023-03-16 DIAGNOSIS — I119 Hypertensive heart disease without heart failure: Secondary | ICD-10-CM | POA: Diagnosis present

## 2023-03-16 DIAGNOSIS — I959 Hypotension, unspecified: Secondary | ICD-10-CM | POA: Diagnosis present

## 2023-03-16 DIAGNOSIS — R4182 Altered mental status, unspecified: Secondary | ICD-10-CM | POA: Diagnosis not present

## 2023-03-16 DIAGNOSIS — R41841 Cognitive communication deficit: Secondary | ICD-10-CM | POA: Diagnosis not present

## 2023-03-16 DIAGNOSIS — R2689 Other abnormalities of gait and mobility: Secondary | ICD-10-CM | POA: Diagnosis not present

## 2023-03-16 DIAGNOSIS — E860 Dehydration: Secondary | ICD-10-CM | POA: Diagnosis present

## 2023-03-16 LAB — HEMOGLOBIN A1C
Hgb A1c MFr Bld: 5.8 % — ABNORMAL HIGH (ref 4.8–5.6)
Mean Plasma Glucose: 119.76 mg/dL

## 2023-03-16 LAB — MAGNESIUM: Magnesium: 1.7 mg/dL (ref 1.7–2.4)

## 2023-03-16 LAB — BASIC METABOLIC PANEL
Anion gap: 8 (ref 5–15)
BUN: 12 mg/dL (ref 8–23)
CO2: 27 mmol/L (ref 22–32)
Calcium: 9.1 mg/dL (ref 8.9–10.3)
Chloride: 105 mmol/L (ref 98–111)
Creatinine, Ser: 0.9 mg/dL (ref 0.61–1.24)
GFR, Estimated: >60 mL/min (ref >60)
Glucose, Bld: 86 mg/dL (ref 70–99)
Potassium: 3.6 mmol/L (ref 3.5–5.1)
Sodium: 140 mmol/L (ref 135–145)

## 2023-03-16 LAB — TSH: TSH: 1.024 u[IU]/mL (ref 0.350–4.500)

## 2023-03-16 LAB — PHOSPHORUS: Phosphorus: 2.3 mg/dL — ABNORMAL LOW (ref 2.5–4.6)

## 2023-03-16 LAB — CORTISOL: Cortisol, Plasma: 6.8 ug/dL

## 2023-03-16 LAB — GLUCOSE, CAPILLARY
Glucose-Capillary: 92 mg/dL (ref 70–99)
Glucose-Capillary: 82 mg/dL (ref 70–99)
Glucose-Capillary: 88 mg/dL (ref 70–99)
Glucose-Capillary: 128 mg/dL — ABNORMAL HIGH (ref 70–99)
Glucose-Capillary: 116 mg/dL — ABNORMAL HIGH (ref 70–99)
Glucose-Capillary: 100 mg/dL — ABNORMAL HIGH (ref 70–99)

## 2023-03-16 MED ORDER — POTASSIUM PHOSPHATES 15 MMOLE/5ML IV SOLN
30.0000 mmol | Freq: Once | INTRAVENOUS | Status: AC
  Administered 2023-03-16: 30 mmol via INTRAVENOUS
  Filled 2023-03-16: qty 10

## 2023-03-16 MED ORDER — METOPROLOL SUCCINATE ER 25 MG PO TB24
12.5000 mg | ORAL_TABLET | Freq: Every day | ORAL | Status: DC
  Administered 2023-03-16 – 2023-03-18 (×3): 12.5 mg via ORAL
  Filled 2023-03-16 (×3): qty 1

## 2023-03-16 NOTE — Progress Notes (Signed)
Neurology Progress Note  Brief HPI: 82 y.o. 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  Presented with acute onset of aphasia.  Patient's wife saw patient normal on the morning of 10/12 Following her shower, found patient unresponsive in the dining room with his eyes open, unable to speak, not following commands.  On arrival, patient's blood glucose was 77 with improvement following oral intake.  CT head obtained without acute  Subjective: -No family at bedside.  No acute events overnight.  Exam: Vitals:   03/16/23 0400 03/16/23 0800  BP: (!) 144/95 (!) 150/93  Pulse: 60 63  Resp: 17 13  Temp: 98.1 F (36.7 C) 98.3 F (36.8 C)  SpO2: 96% 96%   General: Comfortably sleeping in bed-no acute distress HEENT: No Solik dermatic  Neuro: Comfortably sleeping in bed Opens eyes to voice Follows commands Able to name simple objects Repetition intact Comprehension intact Fluency is not impaired Poor attention concentration Cranial nerves II to XII intact Motor examination with no drift in any of the 4 extremities Sensation intact light touch No dysmetria  Pertinent Labs: CBC    Component Value Date/Time   WBC 10.3 03/14/2023 1920   RBC 5.28 03/14/2023 1920   HGB 16.5 03/14/2023 1920   HGB 17.2 11/12/2018 0950   HCT 50.3 03/14/2023 1920   HCT 52.0 (H) 11/12/2018 0950   PLT 118 (L) 03/14/2023 1920   PLT 144 (L) 11/12/2018 0950   MCV 95.3 03/14/2023 1920   MCV 92 11/12/2018 0950   MCH 31.3 03/14/2023 1920   MCHC 32.8 03/14/2023 1920   RDW 14.6 03/14/2023 1920   RDW 13.7 11/12/2018 0950   LYMPHSABS 1.0 03/14/2023 1920   LYMPHSABS 1.3 11/12/2018 0950   MONOABS 0.3 03/14/2023 1920   EOSABS 0.0 03/14/2023 1920   EOSABS 0.1 11/12/2018 0950   BASOSABS 0.0 03/14/2023 1920   BASOSABS 0.0 11/12/2018 0950   CMP     Component Value Date/Time   NA 140  03/16/2023 0741   NA 143 11/12/2018 0950   K 3.6 03/16/2023 0741   CL 105 03/16/2023 0741   CO2 27 03/16/2023 0741   GLUCOSE 86 03/16/2023 0741   BUN 12 03/16/2023 0741   BUN 15 11/12/2018 0950   CREATININE 0.90 03/16/2023 0741   CALCIUM 9.1 03/16/2023 0741   PROT 6.9 03/14/2023 1920   ALBUMIN 3.5 03/14/2023 1920   AST 18 03/14/2023 1920   ALT 13 03/14/2023 1920   ALKPHOS 70 03/14/2023 1920   BILITOT 0.9 03/14/2023 1920   GFR 102.85 05/05/2012 0826   GFRNONAA >60 03/16/2023 0741   Urinalysis    Component Value Date/Time   COLORURINE YELLOW 03/14/2023 1935   APPEARANCEUR HAZY (A) 03/14/2023 1935   LABSPEC 1.014 03/14/2023 1935   PHURINE 5.0 03/14/2023 1935   GLUCOSEU 50 (A) 03/14/2023 1935   GLUCOSEU NEGATIVE 02/17/2011 0930   HGBUR MODERATE (A) 03/14/2023 1935   BILIRUBINUR NEGATIVE 03/14/2023 1935   KETONESUR NEGATIVE 03/14/2023 1935   PROTEINUR 100 (A) 03/14/2023 1935   UROBILINOGEN 1.0 09/12/2012 1802   NITRITE NEGATIVE 03/14/2023 1935   LEUKOCYTESUR MODERATE (A) 03/14/2023 1935   Imaging Reviewed: CT head 10/13:  No acute intracranial hemorrhage or evidence of acute large vessel territory infarct. ASPECT score is 10. Unchanged background of severe chronic small-vessel disease.  Routine EEG-intermittent generalized slowing.  Assessment: 82 year old male with PMHx of CVA and multiple "amyloid spells"/transient focal neurologic  deficits associated with CAA, prior history of ICH, hypertension presenting with an acute onset of aphasia.  On chart review, patient was noted to have several similar spells in the past with negative extensive workup including LTM EEG and neuroimaging.  Due to the prolonged nature of patient's symptoms and compared to past episodes, will obtain MRI brain and routine EEG for further evaluation. There is additional concern for symptoms in the setting of hypoglycemia and hypotension though patient's postoperative blood glucose since arrival 77.   Lowest documented hypotensive event during current evaluation documented as 84/68 mmHg at 00:33 this morning. During the current admission, routine EEG showed generalized intermittent slowing.  No focality.  No seizures.  Recommendations: -MRI brain without contrast-pending at this time. Supportive management per primary team as you are Will follow the MRI results  Plan d/w Dr. Thedore Mins  -- Milon Dikes, MD Neurologist [facility] Pager: [phone]

## 2023-03-16 NOTE — NC FL2 (Signed)
Dustin Hamilton MEDICAID [id number] LEVEL OF CARE FORM     IDENTIFICATION  Patient Name: Dustin Hamilton Birthdate: July 31, 1940 Sex: male Admission Date (Current Location): 03/14/2023  [facility] and [place] Number:  Producer, television/film/video and Address:  [facility] [place], [address], [place], [place] [address]      Provider Number: [id number]  Attending Physician Name and Address:  Dustin Sea, MD  Relative Name and Phone Number:  Dustin Hamilton; Wife; [phone]    Current Level of Care: Hospital Recommended Level of Care: Skilled Nursing Facility Prior Approval Number:    Date Approved/Denied:   PASRR Number: [id number]  Discharge Plan: SNF    Current Diagnoses: Patient Active Problem List   Diagnosis Date Noted   AMS (altered mental status) 03/15/2023   Hypoglycemia 03/15/2023   UTI (urinary tract infection) 03/15/2023   Glaucoma 03/15/2023   Gout 03/15/2023   Hypotension 10/19/2022   Dementia (HCC) 07/02/2022   Generalized weakness 07/02/2022   Sepsis (HCC) 06/26/2021   Acute metabolic encephalopathy 05/31/2021   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   Fever and chills 04/01/2021   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   Complete heart block (HCC)    Heart block    Syncope 05/03/2020   History of stroke 05/03/2020   History of supraventricular tachycardia 05/03/2020   Thrombocytopenia (HCC) 05/03/2020   AKI (acute kidney injury) (HCC) 05/03/2020   Memory impairment 05/03/2020   Atypical chest pain 01/27/2020   SVT (supraventricular tachycardia) (HCC) 06/03/2017   Dizziness 08/02/2014   Cerebral infarction (HCC) 09/13/2012   Vertigo 09/12/2012   EAR PAIN 09/05/2009   BACK PAIN 06/20/2009   COLONIC POLYPS 05/06/2007   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   NONDEPENDENT ALCOHOL ABUSE IN REMISSION 05/06/2007   Essential hypertension 05/06/2007   Hypertensive heart disease without heart failure 05/06/2007   INTRACRANIAL HEMORRHAGE  05/06/2007   TIA (transient ischemic attack) 05/06/2007   HEMORRHOIDS 05/06/2007   INGUINAL HERNIA, RIGHT 05/06/2007   RENAL CYST 05/06/2007   HEMATURIA UNSPECIFIED 05/06/2007   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   PSA, INCREASED 05/06/2007   LIVER FUNCTION TESTS, ABNORMAL 05/06/2007   Benign prostatic hyperplasia with urinary obstruction 05/06/2007    Orientation RESPIRATION BLADDER Height & Weight     Self  Normal (Room Air) Incontinent, External catheter Weight: 155 lb (70.3 kg) Height:  5\' 11"  (180.3 cm)  BEHAVIORAL SYMPTOMS/MOOD NEUROLOGICAL BOWEL NUTRITION STATUS      Continent Diet  AMBULATORY STATUS COMMUNICATION OF NEEDS Skin   Extensive Assist Verbally Normal                       Personal Care Assistance Level of Assistance  Bathing, Feeding, Dressing Bathing Assistance: Maximum assistance Feeding assistance: Maximum assistance Dressing Assistance: Maximum assistance     Functional Limitations Info  Sight Sight Info: Impaired (Blind (L) Blurred (R))        SPECIAL CARE FACTORS FREQUENCY  PT (By licensed PT), OT (By licensed OT)     PT Frequency: 5x OT Frequency: 5x            Contractures Contractures Info: Not present    Additional Factors Info  Code Status, Allergies Code Status Info: Full Code Allergies Info: Aricept (donepezil); Levetiracetam; Penicillins; Viagra (sildenafil)           Current Medications (03/16/2023):  This is the current hospital active medication list Current Facility-Administered Medications  Medication Dose Route Frequency Provider Last Rate Last Admin  acetaminophen (TYLENOL) tablet 650 mg  650 mg Oral Q6H PRN Dustin Giovanni, MD       Or   acetaminophen (TYLENOL) suppository 650 mg  650 mg Rectal Q6H PRN Dustin Giovanni, MD       allopurinol (ZYLOPRIM) tablet 100 mg  100 mg Oral Daily Dustin Giovanni, MD   100 mg at 03/16/23 1000   brimonidine (ALPHAGAN) 0.2 % ophthalmic solution 1 drop  1  drop Both Eyes BID Dustin Giovanni, MD   1 drop at 03/16/23 1001   cefTRIAXone (ROCEPHIN) 1 g in sodium chloride 0.9 % 100 mL IVPB  1 g Intravenous Q24H Dustin Giovanni, MD 200 mL/hr at 03/15/23 2312 1 g at 03/15/23 2312   dorzolamide (TRUSOPT) 2 % ophthalmic solution 1 drop  1 drop Both Eyes BID Dustin Giovanni, MD   1 drop at 03/16/23 1001   dutasteride (AVODART) capsule 0.5 mg  0.5 mg Oral Daily Dustin Giovanni, MD   0.5 mg at 03/16/23 1000   enoxaparin (LOVENOX) injection 40 mg  40 mg Subcutaneous Q24H Dustin Giovanni, MD   40 mg at 03/16/23 1001   feeding supplement (ENSURE ENLIVE / ENSURE PLUS) liquid 237 mL  237 mL Oral BID BM Dustin Hamilton, Dustin Neat, MD   237 mL at 03/16/23 1001   latanoprost (XALATAN) 0.005 % ophthalmic solution 1 drop  1 drop Both Eyes QHS Dustin Giovanni, MD   1 drop at 03/15/23 2113   memantine (NAMENDA) tablet 10 mg  10 mg Oral BID Dustin Giovanni, MD   10 mg at 03/16/23 1000   metoprolol succinate (TOPROL-XL) 24 hr tablet 12.5 mg  12.5 mg Oral Daily Dustin Sea, MD       potassium PHOSPHATE 30 mmol in dextrose 5 % 500 mL infusion  30 mmol Intravenous Once Dustin Sea, MD         Discharge Medications: Please see discharge summary for a list of discharge medications.  Relevant Imaging Results:  Relevant Lab Results:   Additional Information    Dustin Hamilton Dustin Mew, LCSW

## 2023-03-16 NOTE — TOC Progression Note (Addendum)
Transition of Care [facility]) - Progression Note    Patient Details  Name: MCCRAE SPECIALE MRN: [id number] Date of Birth: 01-Jan-1941  [facility]) CM/SW Contact  Marliss Coots, LCSW Phone Number: 03/16/2023, 1:17 PM  Clinical Narrative:     1:15: This CSW called patient's wife, Chino Sardo, to follow up on discharge planning. [name] continues to request discharge to home with home health at this time. The plan is for the son to provide patient transportation from the hospital to home; however, this has yet to be verified. [name] expressed interest in other transportation options, which will be followed up by this CSW.  3:43 PM This CSW called patient's wife, Malekai Markwood, to follow up on prior conversation. [name] relayed that patient received home health four times a week through the [place] prior to his hospital admissions. From that the patient received PT, OT, and ST. [name] emphasized the importance of PT for patient upon discharge. Patient has a wheelchair, beside commode, and two types of walkers. This CSW reiterated to [name] that ambulance transportation is an option if the family is unable to privately transport the patient home from the hospital upon discharge. [name] expressed understanding of this.       Expected Discharge Plan and Services                                               Social Determinants of Health (SDOH) Interventions SDOH Screenings   Food Insecurity: No Food Insecurity (03/15/2023)  Housing: Low Risk  (03/15/2023)  Transportation Needs: No Transportation Needs (03/15/2023)  Utilities: Not At Risk (03/15/2023)  Social Connections: Unknown (10/14/2021)   Received from [facility]  Tobacco Use: Medium Risk (02/08/2023)    Readmission Risk Interventions     No data to display

## 2023-03-16 NOTE — Plan of Care (Signed)
  Problem: Education: Goal: Understanding of CV disease, CV risk reduction, and recovery process will improve Outcome: Progressing Goal: Individualized Educational Video(s) Outcome: Progressing   Problem: Activity: Goal: Ability to return to baseline activity level will improve Outcome: Progressing   Problem: Cardiovascular: Goal: Ability to achieve and maintain adequate cardiovascular perfusion will improve Outcome: Progressing   Problem: Health Behavior/Discharge Planning: Goal: Ability to safely manage health-related needs after discharge will improve Outcome: Progressing

## 2023-03-16 NOTE — Progress Notes (Signed)
in Accession #:    [id number]         Weight:       155.0 lb Date of Birth:  Oct 21, 1940          BSA:          1.892 m Patient Age:    82 years           BP:           141/85 mmHg Patient Gender: M                  HR:           58 bpm. Exam Location:  Inpatient Procedure: 2D Echo, Color Doppler, Cardiac Doppler and Intracardiac            Opacification Agent Indications:    Syncope  History:        Patient has prior history of Echocardiogram examinations, most                 recent 09/04/2021. Pacemaker and AVNRT ablation, Arrythmias:CHB,                 SVT, Signs/Symptoms:Syncope; Risk Factors:Hypertension.  Sonographer:    Milbert Coulter Referring Phys: John Giovanni  Sonographer Comments: Technically difficult study due to poor echo windows, suboptimal parasternal window, no apical window and no subcostal window. Image acquisition challenging due to patient body habitus and Image acquisition challenging due to respiratory motion. IMPRESSIONS  1. Left ventricular ejection fraction, by estimation, is 60 to 65%. The left ventricle has normal function. Left ventricular endocardial border not optimally defined to evaluate regional wall motion. There is mild left ventricular hypertrophy. Left ventricular diastolic function could not be evaluated.  2. Right ventricular systolic function is normal. The right ventricular size is mildly enlarged.  3. A small pericardial effusion is present.  4. The mitral valve is grossly normal. No evidence of mitral valve regurgitation.  5. The  aortic valve is grossly normal. There is mild calcification of the aortic valve. Aortic valve regurgitation is not visualized. No aortic stenosis is present. FINDINGS  Left Ventricle: Left ventricular ejection fraction, by estimation, is 60 to 65%. The left ventricle has normal function. Left ventricular endocardial border not optimally defined to evaluate regional wall motion. Definity contrast agent was given IV to delineate the left ventricular endocardial borders. The left ventricular internal cavity size was normal in size. There is mild left ventricular hypertrophy. Left ventricular diastolic function could not be evaluated due to nondiagnostic images. Left ventricular diastolic function could not be evaluated. Right Ventricle: The right ventricular size is mildly enlarged. Right vetricular wall thickness was not well visualized. Right ventricular systolic function is normal. Left Atrium: Left atrial size was normal in size. Right Atrium: Right atrial size was normal in size. Pericardium: A small pericardial effusion is present. Mitral Valve: The mitral valve is grossly normal. No evidence of mitral valve regurgitation. Tricuspid Valve: The tricuspid valve is grossly normal. Tricuspid valve regurgitation is trivial. Aortic Valve: The aortic valve is grossly normal. There is mild calcification of the aortic valve. Aortic valve regurgitation is not visualized. No aortic stenosis is present. Pulmonic Valve: The pulmonic valve was not well visualized. Pulmonic valve regurgitation is trivial. Aorta: The aortic root is normal in size and structure. Venous: The inferior vena cava was not well visualized. IAS/Shunts: The interatrial septum was not assessed.  LEFT VENTRICLE PLAX 2D LVIDd:         3.80  in Accession #:    [id number]         Weight:       155.0 lb Date of Birth:  Oct 21, 1940          BSA:          1.892 m Patient Age:    82 years           BP:           141/85 mmHg Patient Gender: M                  HR:           58 bpm. Exam Location:  Inpatient Procedure: 2D Echo, Color Doppler, Cardiac Doppler and Intracardiac            Opacification Agent Indications:    Syncope  History:        Patient has prior history of Echocardiogram examinations, most                 recent 09/04/2021. Pacemaker and AVNRT ablation, Arrythmias:CHB,                 SVT, Signs/Symptoms:Syncope; Risk Factors:Hypertension.  Sonographer:    Milbert Coulter Referring Phys: John Giovanni  Sonographer Comments: Technically difficult study due to poor echo windows, suboptimal parasternal window, no apical window and no subcostal window. Image acquisition challenging due to patient body habitus and Image acquisition challenging due to respiratory motion. IMPRESSIONS  1. Left ventricular ejection fraction, by estimation, is 60 to 65%. The left ventricle has normal function. Left ventricular endocardial border not optimally defined to evaluate regional wall motion. There is mild left ventricular hypertrophy. Left ventricular diastolic function could not be evaluated.  2. Right ventricular systolic function is normal. The right ventricular size is mildly enlarged.  3. A small pericardial effusion is present.  4. The mitral valve is grossly normal. No evidence of mitral valve regurgitation.  5. The  aortic valve is grossly normal. There is mild calcification of the aortic valve. Aortic valve regurgitation is not visualized. No aortic stenosis is present. FINDINGS  Left Ventricle: Left ventricular ejection fraction, by estimation, is 60 to 65%. The left ventricle has normal function. Left ventricular endocardial border not optimally defined to evaluate regional wall motion. Definity contrast agent was given IV to delineate the left ventricular endocardial borders. The left ventricular internal cavity size was normal in size. There is mild left ventricular hypertrophy. Left ventricular diastolic function could not be evaluated due to nondiagnostic images. Left ventricular diastolic function could not be evaluated. Right Ventricle: The right ventricular size is mildly enlarged. Right vetricular wall thickness was not well visualized. Right ventricular systolic function is normal. Left Atrium: Left atrial size was normal in size. Right Atrium: Right atrial size was normal in size. Pericardium: A small pericardial effusion is present. Mitral Valve: The mitral valve is grossly normal. No evidence of mitral valve regurgitation. Tricuspid Valve: The tricuspid valve is grossly normal. Tricuspid valve regurgitation is trivial. Aortic Valve: The aortic valve is grossly normal. There is mild calcification of the aortic valve. Aortic valve regurgitation is not visualized. No aortic stenosis is present. Pulmonic Valve: The pulmonic valve was not well visualized. Pulmonic valve regurgitation is trivial. Aorta: The aortic root is normal in size and structure. Venous: The inferior vena cava was not well visualized. IAS/Shunts: The interatrial septum was not assessed.  LEFT VENTRICLE PLAX 2D LVIDd:         3.80  in Accession #:    [id number]         Weight:       155.0 lb Date of Birth:  Oct 21, 1940          BSA:          1.892 m Patient Age:    82 years           BP:           141/85 mmHg Patient Gender: M                  HR:           58 bpm. Exam Location:  Inpatient Procedure: 2D Echo, Color Doppler, Cardiac Doppler and Intracardiac            Opacification Agent Indications:    Syncope  History:        Patient has prior history of Echocardiogram examinations, most                 recent 09/04/2021. Pacemaker and AVNRT ablation, Arrythmias:CHB,                 SVT, Signs/Symptoms:Syncope; Risk Factors:Hypertension.  Sonographer:    Milbert Coulter Referring Phys: John Giovanni  Sonographer Comments: Technically difficult study due to poor echo windows, suboptimal parasternal window, no apical window and no subcostal window. Image acquisition challenging due to patient body habitus and Image acquisition challenging due to respiratory motion. IMPRESSIONS  1. Left ventricular ejection fraction, by estimation, is 60 to 65%. The left ventricle has normal function. Left ventricular endocardial border not optimally defined to evaluate regional wall motion. There is mild left ventricular hypertrophy. Left ventricular diastolic function could not be evaluated.  2. Right ventricular systolic function is normal. The right ventricular size is mildly enlarged.  3. A small pericardial effusion is present.  4. The mitral valve is grossly normal. No evidence of mitral valve regurgitation.  5. The  aortic valve is grossly normal. There is mild calcification of the aortic valve. Aortic valve regurgitation is not visualized. No aortic stenosis is present. FINDINGS  Left Ventricle: Left ventricular ejection fraction, by estimation, is 60 to 65%. The left ventricle has normal function. Left ventricular endocardial border not optimally defined to evaluate regional wall motion. Definity contrast agent was given IV to delineate the left ventricular endocardial borders. The left ventricular internal cavity size was normal in size. There is mild left ventricular hypertrophy. Left ventricular diastolic function could not be evaluated due to nondiagnostic images. Left ventricular diastolic function could not be evaluated. Right Ventricle: The right ventricular size is mildly enlarged. Right vetricular wall thickness was not well visualized. Right ventricular systolic function is normal. Left Atrium: Left atrial size was normal in size. Right Atrium: Right atrial size was normal in size. Pericardium: A small pericardial effusion is present. Mitral Valve: The mitral valve is grossly normal. No evidence of mitral valve regurgitation. Tricuspid Valve: The tricuspid valve is grossly normal. Tricuspid valve regurgitation is trivial. Aortic Valve: The aortic valve is grossly normal. There is mild calcification of the aortic valve. Aortic valve regurgitation is not visualized. No aortic stenosis is present. Pulmonic Valve: The pulmonic valve was not well visualized. Pulmonic valve regurgitation is trivial. Aorta: The aortic root is normal in size and structure. Venous: The inferior vena cava was not well visualized. IAS/Shunts: The interatrial septum was not assessed.  LEFT VENTRICLE PLAX 2D LVIDd:         3.80  PROGRESS NOTE                                                                                                                                                                                                             Patient Demographics:    Dustin Hamilton, is a 82 y.o. male, DOB - 1941-03-17, EXB:[id number]  [facility]patient [facility] the [facility]    LOS - 0  Admit date - 03/14/2023    Chief Complaint  Patient presents with   Altered Mental Status       Brief Narrative (HPI from H&P)   82 y.o. 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  He was completely unresponsive on arrival.  His wife told ED physician that she went to take a shower around 2:15-2:30 PM and at that time patient was at his baseline.  When she checked on the patient at 4:50 PM, he was found unresponsive/collapsed at the dining room table.  In the ER head CT nonacute, EEG unremarkable, MRI ordered seen by neurology and admitted to the hospital.   Subjective:    Dustin Hamilton today has, No headache, No chest pain, No abdominal pain - No Nausea, No new weakness tingling or numbness, no SOB.   Assessment  & Plan :    Altered mental status/unresponsiveness, metabolic encephalopathy  - Due to combination of UTI, dehydration, hypotension and mild hypoglycemia, no headache or focal deficits, head CT EEG unremarkable, MRI pending, history of cerebral amyloid angiopathy in the past, monitor MRI results, advance activity, treat UTI, hydrate with IV fluids, monitor CBGs.  PT OT.  Neurology on board.    Hypotension with UTI and dehydration.  Hydrate with IV fluids and monitor.  No signs of sepsis.   Borderline hypoglycemia - Glucose now improved after oral intake.  Patient is not on any diabetes medications, last A1c 5.5 in April 2023.  Suspect hypoglycemia is due to poor p.o. intake.  Continue CBG checks every 6 hours and encourage p.o. intake.   UTI sent on admission.  Could have caused his episode  in Accession #:    [id number]         Weight:       155.0 lb Date of Birth:  Oct 21, 1940          BSA:          1.892 m Patient Age:    82 years           BP:           141/85 mmHg Patient Gender: M                  HR:           58 bpm. Exam Location:  Inpatient Procedure: 2D Echo, Color Doppler, Cardiac Doppler and Intracardiac            Opacification Agent Indications:    Syncope  History:        Patient has prior history of Echocardiogram examinations, most                 recent 09/04/2021. Pacemaker and AVNRT ablation, Arrythmias:CHB,                 SVT, Signs/Symptoms:Syncope; Risk Factors:Hypertension.  Sonographer:    Milbert Coulter Referring Phys: John Giovanni  Sonographer Comments: Technically difficult study due to poor echo windows, suboptimal parasternal window, no apical window and no subcostal window. Image acquisition challenging due to patient body habitus and Image acquisition challenging due to respiratory motion. IMPRESSIONS  1. Left ventricular ejection fraction, by estimation, is 60 to 65%. The left ventricle has normal function. Left ventricular endocardial border not optimally defined to evaluate regional wall motion. There is mild left ventricular hypertrophy. Left ventricular diastolic function could not be evaluated.  2. Right ventricular systolic function is normal. The right ventricular size is mildly enlarged.  3. A small pericardial effusion is present.  4. The mitral valve is grossly normal. No evidence of mitral valve regurgitation.  5. The  aortic valve is grossly normal. There is mild calcification of the aortic valve. Aortic valve regurgitation is not visualized. No aortic stenosis is present. FINDINGS  Left Ventricle: Left ventricular ejection fraction, by estimation, is 60 to 65%. The left ventricle has normal function. Left ventricular endocardial border not optimally defined to evaluate regional wall motion. Definity contrast agent was given IV to delineate the left ventricular endocardial borders. The left ventricular internal cavity size was normal in size. There is mild left ventricular hypertrophy. Left ventricular diastolic function could not be evaluated due to nondiagnostic images. Left ventricular diastolic function could not be evaluated. Right Ventricle: The right ventricular size is mildly enlarged. Right vetricular wall thickness was not well visualized. Right ventricular systolic function is normal. Left Atrium: Left atrial size was normal in size. Right Atrium: Right atrial size was normal in size. Pericardium: A small pericardial effusion is present. Mitral Valve: The mitral valve is grossly normal. No evidence of mitral valve regurgitation. Tricuspid Valve: The tricuspid valve is grossly normal. Tricuspid valve regurgitation is trivial. Aortic Valve: The aortic valve is grossly normal. There is mild calcification of the aortic valve. Aortic valve regurgitation is not visualized. No aortic stenosis is present. Pulmonic Valve: The pulmonic valve was not well visualized. Pulmonic valve regurgitation is trivial. Aorta: The aortic root is normal in size and structure. Venous: The inferior vena cava was not well visualized. IAS/Shunts: The interatrial septum was not assessed.  LEFT VENTRICLE PLAX 2D LVIDd:         3.80  PROGRESS NOTE                                                                                                                                                                                                             Patient Demographics:    Dustin Hamilton, is a 82 y.o. male, DOB - 1941-03-17, EXB:[id number]  Outpatient [facility] is [facility]    LOS - 0  Admit date - 03/14/2023    Chief Complaint  Patient presents with   Altered Mental Status       Brief Narrative (HPI from H&P)   82 y.o. 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  He was completely unresponsive on arrival.  His wife told ED physician that she went to take a shower around 2:15-2:30 PM and at that time patient was at his baseline.  When she checked on the patient at 4:50 PM, he was found unresponsive/collapsed at the dining room table.  In the ER head CT nonacute, EEG unremarkable, MRI ordered seen by neurology and admitted to the hospital.   Subjective:    Dustin Hamilton today has, No headache, No chest pain, No abdominal pain - No Nausea, No new weakness tingling or numbness, no SOB.   Assessment  & Plan :    Altered mental status/unresponsiveness, metabolic encephalopathy  - Due to combination of UTI, dehydration, hypotension and mild hypoglycemia, no headache or focal deficits, head CT EEG unremarkable, MRI pending, history of cerebral amyloid angiopathy in the past, monitor MRI results, advance activity, treat UTI, hydrate with IV fluids, monitor CBGs.  PT OT.  Neurology on board.    Hypotension with UTI and dehydration.  Hydrate with IV fluids and monitor.  No signs of sepsis.   Borderline hypoglycemia - Glucose now improved after oral intake.  Patient is not on any diabetes medications, last A1c 5.5 in April 2023.  Suspect hypoglycemia is due to poor p.o. intake.  Continue CBG checks every 6 hours and encourage p.o. intake.   UTI sent on admission.  Could have caused his episode

## 2023-03-16 NOTE — Evaluation (Addendum)
Clinical/Bedside Swallow Evaluation Patient Details  Name: Dustin Hamilton MRN: [id number] Date of Birth: [birth year]  Today's Date: 03/16/2023 Time: SLP Start Time (ACUTE ONLY): 1615 SLP Stop Time (ACUTE ONLY): 1635 SLP Time Calculation (min) (ACUTE ONLY): 20 min  Past Medical History:  Past Medical History:  Diagnosis Date   BPH (benign prostatic hyperplasia)    Cerebrovascular disease    Clostridium difficile diarrhea    Congenital anomaly of diaphragm    Elevated PSA    Glaucoma, both eyes    Hemorrhoid    Hepatitis B surface antigen positive    02-20-2011   History of adenomatous polyp of colon    2007, 2009 and 2013  tubular adenoma's   History of alcohol abuse    quit 1963   History of cerebral parenchymal hemorrhage    01/ 2006  left occiptial lobe related to hypertensive crisis   History of CVA (cerebrovascular accident)    09-12-2012  left hippocampus/ amygdala junction and per MRI old white matter infarcts--  per pt residual short- term memory issues   History of fatty infiltration of liver hx visit's at [facility] , last visit 05/ 2014   elvated LFT's ,  via liver bx 2004 related to hx alcohol and drug abuse (quit 1964)   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   HTN (hypertension)    Renal cyst, left    Stroke (HCC)    hx of 3 strokes in past    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    Urethral lesion    urethral mass   Past Surgical History:  Past Surgical History:  Procedure Laterality Date   CARDIOVASCULAR STRESS TEST  05/05/2007   normal nuclear study w/ no ischemia/  normal LV fucntion and wall motion , ef60%   COLONOSCOPY  last one 04-06-2012   CYSTO/  LEFT RETROGRADE PYELOGRAM/ CYTOLOGY WASHINGS/  URETEROSCOPY  03/05/2000   INGUINAL HERNIA REPAIR Bilateral 1965 and 1980's   IR 3D INDEPENDENT WKST  07/31/2022   IR ANGIOGRAM PELVIS SELECTIVE OR SUPRASELECTIVE  07/31/2022   IR ANGIOGRAM  SELECTIVE EACH ADDITIONAL VESSEL  07/31/2022   IR ANGIOGRAM SELECTIVE EACH ADDITIONAL VESSEL  07/31/2022   IR ANGIOGRAM SELECTIVE EACH ADDITIONAL VESSEL  07/31/2022   IR ANGIOGRAM SELECTIVE EACH ADDITIONAL VESSEL  07/31/2022   IR EMBO TUMOR ORGAN ISCHEMIA INFARCT INC GUIDE ROADMAPPING  07/31/2022   IR RADIOLOGIST EVAL & MGMT  06/26/2022   IR RADIOLOGIST EVAL & MGMT  08/08/2022   IR RADIOLOGIST EVAL & MGMT  08/21/2022   IR US GUIDE VASC ACCESS RIGHT  07/31/2022   LAPAROSCOPIC INGUINAL HERNIA WITH UMBILICAL HERNIA Right 06/24/2007   LIVER BIOPSY  1980's and 2004   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   Procedure: PACEMAKER IMPLANT;  Surgeon: Marinus Maw, MD;  Location: [facility];  Service: Cardiovascular;  Laterality: N/A;   SVT ABLATION N/A 11/15/2018   Procedure: SVT ABLATION;  Surgeon: Marinus Maw, MD;  Location: [facility];  Service: Cardiovascular;  Laterality: N/A;   TRANSTHORACIC ECHOCARDIOGRAM  09/13/2012   moderate LVH,  ef 60-65%/     TRANSURETHRAL RESECTION OF BLADDER TUMOR N/A 08/11/2016   Procedure: TRANSURETHRAL RESECTION OF BLADDER TUMOR (TURBT);  Surgeon: Malen Gauze, MD;  Location: [facility];  Service: Urology;  Laterality: N/A;   HPI:  Dustin Hamilton is an 82 yo male presenting to  ED 10/12 with AMS and acute onset of aphasia. UA with possible UTI. CXR with severe chronic elevation of R hemidiaphragm with increasing atelectasis. CTH negative. MRI Brain pending. MBS January 2024 with mild-moderate oropharyngeal dysphagia characterized by delayed swallow initiation, oropharyngeal residue resolved by cued multiple swallows, deep penetration of thin liquids. He was recommended to initiate a Dys 3 diet with nectar thick liquids at that time; however, pt's wife reports he does not like thickened liquids and is noncompliant with use. PMH includes cerebral amyloid angiopathy, L hippocampus/amygdala infarct, history of alcohol and IV drug use, history  of ICH, dementia, HTN, recurrent C. Difficile, BPH    Assessment / Plan / Recommendation  Clinical Impression  [name] wife reports pt has a history of dysphagia and frequently gets "strangled" while drinking thin liquids at home. She reports that previous SLP recommended bite-sized pieces of solids with thickened liquids. Oral motor exam WFL. Observed pt with trials of thin liquids, purees, and solids with no signs clinically concerning for dysphagia or aspiration. Discussed pt's history extensively with pt and his wife, who state they would prefer to reassess swallowing with an instrumental swallow study given pt's reluctance to use thickened liquids. Provided education regarding potential for use of compensatory strategies. Recommend diet of Dys 3 texture solids with thin liquids for now pending results of MBS, which is tentatively planned to be completed next date. SLP Visit Diagnosis: Dysphagia, unspecified (R13.10)    Aspiration Risk  Moderate aspiration risk    Diet Recommendation Dysphagia 3 (Mech soft);Thin liquid    Liquid Administration via: Cup;Straw Medication Administration: Whole meds with liquid Supervision: Patient able to self feed;Full supervision/cueing for compensatory strategies Compensations: Minimize environmental distractions;Slow rate;Small sips/bites;Multiple dry swallows after each bite/sip Postural Changes: Seated upright at 90 degrees    Other  Recommendations Oral Care Recommendations: Oral care BID    Recommendations for follow up therapy are one component of a multi-disciplinary discharge planning process, led by the attending physician.  Recommendations may be updated based on patient status, additional functional criteria and insurance authorization.  Follow up Recommendations Home health SLP      Assistance Recommended at Discharge    Functional Status Assessment Patient has had a recent decline in their functional status and demonstrates the ability to  make significant improvements in function in a reasonable and predictable amount of time.  Frequency and Duration min 2x/week  2 weeks       Prognosis Prognosis for improved oropharyngeal function: Good Barriers to Reach Goals: Cognitive deficits;Time post onset      Swallow Study   General HPI: Dustin Hamilton is an 82 yo male presenting to ED 10/12 with AMS and acute onset of aphasia. UA with possible UTI. CXR with severe chronic elevation of R hemidiaphragm with increasing atelectasis. CTH negative. MRI Brain pending. MBS January 2024 with mild-moderate oropharyngeal dysphagia characterized by delayed swallow initiation, oropharyngeal residue resolved by cued multiple swallows, deep penetration of thin liquids. He was recommended to initiate a Dys 3 diet with nectar thick liquids at that time; however, pt's wife reports he does not like thickened liquids and is noncompliant with use. PMH includes cerebral amyloid angiopathy, L hippocampus/amygdala infarct, history of alcohol and IV drug use, history of ICH, dementia, HTN, recurrent C. Difficile, BPH Type of Study: Bedside Swallow Evaluation Previous Swallow Assessment: see HPI Diet Prior to this Study: Regular;Thin liquids (Level 0) Temperature Spikes Noted: No Respiratory Status: Room air History of Recent Intubation: No Behavior/Cognition: Alert;Cooperative;Pleasant mood  Oral Cavity Assessment: Within Functional Limits Oral Care Completed by SLP: No Oral Cavity - Dentition: Adequate natural dentition Vision: Functional for self-feeding Self-Feeding Abilities: Needs assist Patient Positioning: Upright in bed Baseline Vocal Quality: Normal Volitional Cough: Strong Volitional Swallow: Able to elicit    Oral/Motor/Sensory Function Overall Oral Motor/Sensory Function: Within functional limits   Ice Chips Ice chips: Not tested   Thin Liquid Thin Liquid: Within functional limits Presentation: Straw;Self Fed    Nectar Thick Nectar  Thick Liquid: Not tested   Honey Thick Honey Thick Liquid: Not tested   Puree Puree: Within functional limits Presentation: Spoon;Self Fed   Solid     Solid: Within functional limits Presentation: Self Fed      Gwynneth Aliment, M.A., CF-SLP Speech Language Pathology, Acute Rehabilitation Services  Secure Chat preferred [phone]  03/16/2023,5:10 PM

## 2023-03-17 ENCOUNTER — Inpatient Hospital Stay (HOSPITAL_COMMUNITY): Payer: Non-veteran care

## 2023-03-17 DIAGNOSIS — R4182 Altered mental status, unspecified: Secondary | ICD-10-CM | POA: Diagnosis not present

## 2023-03-17 LAB — BASIC METABOLIC PANEL
Anion gap: 9 (ref 5–15)
BUN: 14 mg/dL (ref 8–23)
CO2: 26 mmol/L (ref 22–32)
Calcium: 9.3 mg/dL (ref 8.9–10.3)
Chloride: 103 mmol/L (ref 98–111)
Creatinine, Ser: 1.08 mg/dL (ref 0.61–1.24)
GFR, Estimated: >60 mL/min (ref >60)
Glucose, Bld: 102 mg/dL — ABNORMAL HIGH (ref 70–99)
Potassium: 3.9 mmol/L (ref 3.5–5.1)
Sodium: 138 mmol/L (ref 135–145)

## 2023-03-17 LAB — CBC WITH DIFFERENTIAL/PLATELET
Abs Immature Granulocytes: 0.01 10*3/uL (ref 0.00–0.07)
Basophils Absolute: 0 10*3/uL (ref 0.0–0.1)
Basophils Relative: 0 %
Eosinophils Absolute: 0.2 10*3/uL (ref 0.0–0.5)
Eosinophils Relative: 3 %
HCT: 42.7 % (ref 39.0–52.0)
Hemoglobin: 14.6 g/dL (ref 13.0–17.0)
Immature Granulocytes: 0 %
Lymphocytes Relative: 28 %
Lymphs Abs: 1.7 10*3/uL (ref 0.7–4.0)
MCH: 31.8 pg (ref 26.0–34.0)
MCHC: 34.2 g/dL (ref 30.0–36.0)
MCV: 93 fL (ref 80.0–100.0)
Monocytes Absolute: 0.4 10*3/uL (ref 0.1–1.0)
Monocytes Relative: 7 %
Neutro Abs: 3.6 10*3/uL (ref 1.7–7.7)
Neutrophils Relative %: 62 %
Platelets: 114 10*3/uL — ABNORMAL LOW (ref 150–400)
RBC: 4.59 MIL/uL (ref 4.22–5.81)
RDW: 14.2 % (ref 11.5–15.5)
WBC: 5.9 10*3/uL (ref 4.0–10.5)
nRBC: 0 % (ref 0.0–0.2)

## 2023-03-17 LAB — URINE CULTURE: Culture: >=100000 — AB

## 2023-03-17 LAB — MAGNESIUM: Magnesium: 1.7 mg/dL (ref 1.7–2.4)

## 2023-03-17 LAB — PHOSPHORUS: Phosphorus: 3.2 mg/dL (ref 2.5–4.6)

## 2023-03-17 LAB — GLUCOSE, CAPILLARY
Glucose-Capillary: 102 mg/dL — ABNORMAL HIGH (ref 70–99)
Glucose-Capillary: 125 mg/dL — ABNORMAL HIGH (ref 70–99)
Glucose-Capillary: 127 mg/dL — ABNORMAL HIGH (ref 70–99)
Glucose-Capillary: 148 mg/dL — ABNORMAL HIGH (ref 70–99)
Glucose-Capillary: 87 mg/dL (ref 70–99)

## 2023-03-17 LAB — BRAIN NATRIURETIC PEPTIDE: B Natriuretic Peptide: 158.3 pg/mL — ABNORMAL HIGH (ref 0.0–100.0)

## 2023-03-17 MED ORDER — ASPIRIN 81 MG PO CHEW
81.0000 mg | CHEWABLE_TABLET | Freq: Every day | ORAL | Status: DC
  Administered 2023-03-17 – 2023-03-21 (×5): 81 mg via ORAL
  Filled 2023-03-17 (×5): qty 1

## 2023-03-17 MED ORDER — HALOPERIDOL LACTATE 5 MG/ML IJ SOLN
2.0000 mg | Freq: Four times a day (QID) | INTRAMUSCULAR | Status: DC | PRN
  Administered 2023-03-17 – 2023-03-18 (×2): 2 mg via INTRAVENOUS
  Filled 2023-03-17 (×2): qty 1

## 2023-03-17 MED ORDER — IOHEXOL 350 MG/ML SOLN
75.0000 mL | Freq: Once | INTRAVENOUS | Status: AC | PRN
  Administered 2023-03-17: 75 mL via INTRAVENOUS

## 2023-03-17 MED ORDER — STROKE: EARLY STAGES OF RECOVERY BOOK
Freq: Once | Status: AC
  Filled 2023-03-17: qty 1

## 2023-03-17 NOTE — Progress Notes (Signed)
Acute metabolic encephalopathy in the setting of acute stroke, UTI, hypotension hypoglycemia and dehydration: Patient's nurse reporting that patient is very confused and agitated.  Trying to getting out of the bed.  Per chart review patient developed acute metabolic encephalopathy and MRI showed acute infarction of bilateral PCA territory.  Currently stroke workup is in process. - Continue fall precaution - Continue frequent reorientation - Continue delirium precaution - Giving Haldol 2 mg every 4 hour as needed for agitation.  Tereasa Coop, MD [facility] 03/17/2023, 10:56 PM

## 2023-03-17 NOTE — Progress Notes (Signed)
PROGRESS NOTE                                                                                                                                                                                                             Patient Demographics:    Dustin Hamilton, is a 82 y.o. male, DOB - April 05, 1941, GEX:[id number]  Outpatient Primary MD for the patient is [facility]    LOS - 1  Admit date - 03/14/2023    Chief Complaint  Patient presents with   Altered Mental Status       Brief Narrative (HPI from H&P)   82 y.o. 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  He was completely unresponsive on arrival.  His wife told ED physician that she went to take a shower around 2:15-2:30 PM and at that time patient was at his baseline.  When she checked on the patient at 4:50 PM, he was found unresponsive/collapsed at the dining room table.  In the ER head CT nonacute, EEG unremarkable, MRI ordered seen by neurology and admitted to the hospital.   Subjective:   Patient in bed, appears comfortable, denies any headache, no fever, no chest pain or pressure, no shortness of breath , no abdominal pain. No focal weakness.   Assessment  & Plan :    Altered mental status/unresponsiveness, metabolic encephalopathy  - Due to combination of UTI, dehydration, hypotension and mild hypoglycemia, no headache or focal deficits, head CT EEG unremarkable, MRI pending, history of cerebral amyloid angiopathy in the past, monitor MRI results, advance activity, treat UTI, hydrate with IV fluids, monitor CBGs.  PT OT,  requires SNF per them, TOC looking for a bed.  Neurology on board.   Hypotension with UTI and dehydration.  Hydrate with IV fluids, IV Rocephin and monitor.  No signs of sepsis.   Borderline hypoglycemia - Glucose now improved after oral intake.  Patient is not on any diabetes medications, last A1c 5.5 in April 2023.  Suspect hypoglycemia is due to poor p.o. intake.  Continue CBG checks every 6 hours and encourage p.o.  intake.   UTI sent on admission.  Could have caused his episode of encephalopathy, continue Rocephin and monitor and culture.    BPH status post prostate artery embolization- Continue dutasteride.   Gout - Continue allopurinol.   Chronic mild thrombocytopenia - Platelet count stable and no signs of bleeding.   Dementia - Continue Namenda.   Glaucoma - Continue home eyedrops.    CBG (last 3)  Recent Labs    03/16/23 1645 03/16/23 2317 03/17/23 0524  GLUCAP 116* 100* 125*        Condition - Extremely Guarded  Family Communication  :   Called wife [name] [phone] on 03/16/2023 at 10:22 AM  Code Status :  Full  Consults  :  Neuro  PUD Prophylaxis :    Procedures  :     MRI brain -   EEG - non acute  CT head - non acute.    Echocardiogram. 1. Left ventricular ejection fraction, by estimation, is 60 to 65%. The left ventricle has normal function. Left ventricular endocardial border not optimally defined to evaluate regional wall motion. There is mild left ventricular hypertrophy. Left ventricular diastolic function could not be evaluated.  2. Right ventricular systolic function is normal. The right ventricular size is mildly enlarged.  3. A small pericardial effusion is present.  4. The mitral valve is grossly normal. No evidence of mitral valve regurgitation.  5. The aortic valve is grossly normal. There is mild calcification of the aortic valve. Aortic valve regurgitation is not visualized. No aortic stenosis is present.      Disposition Plan  :     Status is: Observation  DVT Prophylaxis  :    enoxaparin (LOVENOX) injection 40 mg Start: 03/15/23 1000    Lab Results  Component Value Date   PLT 114 (L) 03/17/2023    Diet :  Diet Order             DIET DYS 3 Room service appropriate? Yes with Assist; Fluid consistency: Thin  Diet effective now                    Inpatient Medications  Scheduled Meds:  allopurinol  100 mg Oral Daily   brimonidine  1 drop Both Eyes BID   dorzolamide  1 drop Both Eyes BID   dutasteride  0.5 mg Oral Daily   enoxaparin (LOVENOX) injection  40 mg Subcutaneous Q24H   feeding supplement  237 mL Oral BID BM   latanoprost  1 drop Both Eyes QHS   memantine  10 mg Oral BID   metoprolol succinate  12.5 mg Oral Daily   Continuous Infusions:  cefTRIAXone (ROCEPHIN)  IV 1 g (03/16/23 2309)   PRN Meds:.acetaminophen **OR** acetaminophen  Antibiotics  :    Anti-infectives (From admission, onward)    Start     Dose/Rate Route Frequency Ordered Stop   03/16/23 0000  cefTRIAXone (ROCEPHIN) 1 g in sodium chloride 0.9 % 100 mL IVPB        1 g 200 mL/hr over 30 Minutes Intravenous Every 24 hours 03/15/23 0433     03/15/23 0100  cefTRIAXone (ROCEPHIN) 1 g in sodium chloride 0.9 % 100 mL IVPB        1 g 200 mL/hr over 30 Minutes Intravenous  Once 03/15/23 0050 03/15/23 0141         Objective:   Vitals:   03/16/23 1600 03/16/23 2000 03/16/23 2304 03/17/23 0400  BP:  (!) 139/90 Marland Kitchen)  143/91 (!) 137/90  Pulse:  70 64 61  Resp: 18 11 18 15   Temp: 98 F (36.7 C) 97.8 F (36.6 C) 97.7 F (36.5 C) 97.9 F (36.6 C)  TempSrc: Oral Oral Oral Oral  SpO2:  98% 94% 96%  Weight:      Height:        Wt Readings from Last 3 Encounters:  03/14/23 70.3 kg  10/19/22 70.4 kg  08/02/22 69.9 kg     Intake/Output Summary (Last 24 hours) at 03/17/2023 0853 Last data filed at 03/17/2023 0500 Gross per 24 hour  Intake 320 ml  Output 600 ml  Net -280 ml     Physical Exam  Awake,  minimally confused oriented x 1, No new F.N deficits, Normal affect [place].AT,PERRAL Supple Neck, No JVD,   Symmetrical Chest wall movement, Good air movement bilaterally, CTAB RRR,No Gallops,Rubs or new Murmurs,  +ve B.Sounds, Abd Soft, No tenderness,   No Cyanosis, Clubbing or edema        Data Review:    Recent Labs  Lab 03/14/23 1920 03/17/23 0320  WBC 10.3 5.9  HGB 16.5 14.6  HCT 50.3 42.7  PLT 118* 114*  MCV 95.3 93.0  MCH 31.3 31.8  MCHC 32.8 34.2  RDW 14.6 14.2  LYMPHSABS 1.0 1.7  MONOABS 0.3 0.4  EOSABS 0.0 0.2  BASOSABS 0.0 0.0    Recent Labs  Lab 03/14/23 1920 03/16/23 0741 03/17/23 0320  NA 144 140 138  K 4.1 3.6 3.9  CL 106 105 103  CO2 22 27 26   ANIONGAP 16* 8 9  GLUCOSE 181* 86 102*  BUN 18 12 14   CREATININE 1.16 0.90 1.08  AST 18  --   --   ALT 13  --   --   ALKPHOS 70  --   --   BILITOT 0.9  --   --   ALBUMIN 3.5  --   --   INR 1.1  --   --   TSH  --  1.024  --   HGBA1C  --  5.8*  --   BNP  --   --  158.3*  MG  --  1.7 1.7  CALCIUM 10.0 9.1 9.3      Recent Labs  Lab 03/14/23 1920 03/16/23 0741 03/17/23 0320  INR 1.1  --   --   TSH  --  1.024  --   HGBA1C  --  5.8*  --   BNP  --   --  158.3*  MG  --  1.7 1.7  CALCIUM 10.0 9.1 9.3    --------------------------------------------------------------------------------------------------------------- Lab Results  Component Value Date   CHOL 130 09/05/2021   HDL 28 (L) 09/05/2021   LDLCALC 87 09/05/2021   TRIG 73 09/05/2021   CHOLHDL 4.6 09/05/2021    Lab Results  Component Value Date   HGBA1C 5.8 (H) 03/16/2023   Recent Labs    03/16/23 0741  TSH 1.024   No results for input(s): "VITAMINB12", "FOLATE", "FERRITIN", "TIBC", "IRON", "RETICCTPCT" in the last 72 hours. ------------------------------------------------------------------------------------------------------------------ Cardiac Enzymes No results for input(s): "CKMB", "TROPONINI", "MYOGLOBIN" in the last 168  hours.  Invalid input(s): "CK"  Micro Results Recent Results (from the past 240 hour(s))  Urine Culture (for pregnant, neutropenic or urologic patients or patients with an indwelling urinary catheter)     Status: Abnormal (Preliminary result)   Collection Time: 03/15/23  4:32 AM   Specimen: Urine, Clean Catch  Result Value Ref Range Status  Specimen Description URINE, CLEAN CATCH  Final   Special Requests   Final    NONE Performed at [facility], [address], [place], [place] [address]    Culture >=100,000 COLONIES/mL STAPHYLOCOCCUS EPIDERMIDIS (A)  Final   Report Status PENDING  Incomplete    Radiology Reports ECHOCARDIOGRAM COMPLETE  Result Date: 03/15/2023    ECHOCARDIOGRAM REPORT   Patient Name:   Adonis Housekeeper Date of Exam: 03/15/2023 Medical Rec #:  [id number]          Height:       71.0 in Accession #:    [id number]         Weight:       155.0 lb Date of Birth:  21-Aug-1940          BSA:          1.892 m Patient Age:    82 years           BP:           141/85 mmHg Patient Gender: M                  HR:           58 bpm. Exam Location:  Inpatient Procedure: 2D Echo, Color Doppler, Cardiac Doppler and Intracardiac            Opacification Agent Indications:    Syncope  History:        Patient has prior history of Echocardiogram examinations, most                 recent 09/04/2021. Pacemaker and AVNRT ablation, Arrythmias:CHB,                 SVT, Signs/Symptoms:Syncope; Risk Factors:Hypertension.  Sonographer:    Milbert Coulter Referring Phys: John Giovanni  Sonographer Comments: Technically difficult study due to poor echo windows, suboptimal parasternal window, no apical window and no subcostal window. Image acquisition challenging due to patient body habitus and Image acquisition challenging due to respiratory motion. IMPRESSIONS  1. Left ventricular ejection fraction, by estimation, is 60 to 65%. The left ventricle has normal function. Left ventricular endocardial border  not optimally defined to evaluate regional wall motion. There is mild left ventricular hypertrophy. Left ventricular diastolic function could not be evaluated.  2. Right ventricular systolic function is normal. The right ventricular size is mildly enlarged.  3. A small pericardial effusion is present.  4. The mitral valve is grossly normal. No evidence of mitral valve regurgitation.  5. The aortic valve is grossly normal. There is mild calcification of the aortic valve. Aortic valve regurgitation is not visualized. No aortic stenosis is present. FINDINGS  Left Ventricle: Left ventricular ejection fraction, by estimation, is 60 to 65%. The left ventricle has normal function. Left ventricular endocardial border not optimally defined to evaluate regional wall motion. Definity contrast agent was given IV to delineate the left ventricular endocardial borders. The left ventricular internal cavity size was normal in size. There is mild left ventricular hypertrophy. Left ventricular diastolic function could not be evaluated due to nondiagnostic images. Left ventricular diastolic function could not be evaluated. Right Ventricle: The right ventricular size is mildly enlarged. Right vetricular wall thickness was not well visualized. Right ventricular systolic function is normal. Left Atrium: Left atrial size was normal in size. Right Atrium: Right atrial size was normal in size. Pericardium: A small pericardial effusion is present. Mitral Valve: The mitral valve is grossly normal. No  evidence of mitral valve regurgitation. Tricuspid Valve: The tricuspid valve is grossly normal. Tricuspid valve regurgitation is trivial. Aortic Valve: The aortic valve is grossly normal. There is mild calcification of the aortic valve. Aortic valve regurgitation is not visualized. No aortic stenosis is present. Pulmonic Valve: The pulmonic valve was not well visualized. Pulmonic valve regurgitation is trivial. Aorta: The aortic root is normal in  size and structure. Venous: The inferior vena cava was not well visualized. IAS/Shunts: The interatrial septum was not assessed.  LEFT VENTRICLE PLAX 2D LVIDd:         3.80 cm   Diastology LVIDs:         2.70 cm   LV e' medial:  5.22 cm/s LV PW:         1.20 cm   LV e' lateral: 5.33 cm/s LV IVS:        1.30 cm LVOT diam:     2.00 cm LVOT Area:     3.14 cm  RIGHT VENTRICLE RV S prime:     10.70 cm/s LEFT ATRIUM         Index LA diam:    3.40 cm 1.80 cm/m   AORTA Ao Root diam: 3.00 cm TRICUSPID VALVE TR Peak grad:   22.1 mmHg TR Vmax:        235.00 cm/s  SHUNTS Systemic Diam: 2.00 cm Weston Brass MD Electronically signed by Weston Brass MD Signature Date/Time: 03/15/2023/11:02:52 AM    Final    EEG adult  Result Date: 03/15/2023 Charlsie Quest, MD     03/15/2023  1:04 PM Patient Name: BRYLEN COLLENS MRN: [id number] Epilepsy Attending: Charlsie Quest Referring Physician/Provider: Kara Mead, NP Date: 03/15/2023 Duration: 26.05 mins Patient history: 82yo m with ams getting eeg to evaluate for seizure Level of alertness: Awake, asleep AEDs during EEG study: None Technical aspects: This EEG study was done with scalp electrodes positioned according to the 10-20 International system of electrode placement. Electrical activity was reviewed with band pass filter of 1-70Hz , sensitivity of 7 uV/mm, display speed of 72mm/sec with a 60Hz  notched filter applied as appropriate. EEG data were recorded continuously and digitally stored.  Video monitoring was available and reviewed as appropriate. Description: The posterior dominant rhythm consists of 8 Hz activity of moderate voltage (25-35 uV) seen predominantly in posterior head regions, symmetric and reactive to eye opening and eye closing. Sleep was characterized by vertex waves, sleep spindles (12 to 14 Hz), maximal frontocentral region. EEG showed intermittent generalized 5 to 6 Hz theta slowing. Hyperventilation and photic stimulation were not  performed.   ABNORMALITY - Intermittent slow, generalized IMPRESSION: This study is suggestive of mild diffuse encephalopathy. No seizures or epileptiform discharges were seen throughout the recording. Priyanka [name]   DG CHEST PORT 1 VIEW  Result Date: 03/15/2023 CLINICAL DATA:  82 year old male with history of altered mental status. EXAM: PORTABLE CHEST 1 VIEW COMPARISON:  Chest x-ray 10/18/2022. FINDINGS: Severe elevation of the right hemidiaphragm again noted. Atelectasis and/or scarring in the base of the right lung. Left lung is clear. No pleural effusions. No pneumothorax. No evidence of pulmonary edema. Heart size is normal. Upper mediastinal contours are within normal limits. Left-sided pacemaker device in place with lead tips projecting over the expected location of the right atrium and right ventricle. Atherosclerotic calcifications in the thoracic aorta. IMPRESSION: 1. No radiographic evidence of acute cardiopulmonary disease. 2. 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 3. Aortic atherosclerosis. Electronically Signed   By: Trudie Reed M.D.   On: 03/15/2023 05:04   CT HEAD CODE STROKE WO CONTRAST  Result Date: 03/14/2023 CLINICAL DATA:  Code stroke. Neuro deficit, acute, stroke suspected. EXAM: CT HEAD WITHOUT CONTRAST TECHNIQUE: Contiguous axial images were obtained from the base of the skull through the vertex without intravenous contrast. RADIATION DOSE REDUCTION: This exam was performed according to the departmental dose-optimization program which includes automated exposure control, adjustment of the mA and/or kV according to patient size and/or use of iterative reconstruction technique. COMPARISON:  Head CT 10/18/2022. FINDINGS: Brain: No acute hemorrhage. Unchanged background of severe chronic small-vessel disease. Cortical gray-white differentiation is otherwise preserved. Prominence of  the ventricles and sulci within expected range for age. No hydrocephalus or extra-axial collection. No mass effect or midline shift. Vascular: No hyperdense vessel or unexpected calcification. Skull: No calvarial fracture or suspicious bone lesion. Skull base is unremarkable. Sinuses/Orbits: No acute finding. Other: None. ASPECTS [facility] Stroke Program Early CT Score) - Ganglionic level infarction (caudate, lentiform nuclei, internal capsule, insula, M1-M3 cortex): 7 - Supraganglionic infarction (M4-M6 cortex): 3 Total score (0-10 with 10 being normal): 10 IMPRESSION: 1. No acute intracranial hemorrhage or evidence of acute large vessel territory infarct. ASPECT score is 10. 2. Unchanged background of severe chronic small-vessel disease. Code stroke imaging results were communicated on 03/14/2023 at 6:17 pm to provider Dr. Derry Lory via secure text paging. Electronically Signed   By: Orvan Falconer M.D.   On: 03/14/2023 18:17      Signature  -   Susa Raring M.D on 03/17/2023 at 8:53 AM   -  To page go to [email or web address]

## 2023-03-17 NOTE — Plan of Care (Signed)
  Problem: Education: Goal: Understanding of CV disease, CV risk reduction, and recovery process will improve Outcome: Progressing   Problem: Activity: Goal: Ability to return to baseline activity level will improve Outcome: Progressing   Problem: Cardiovascular: Goal: Ability to achieve and maintain adequate cardiovascular perfusion will improve Outcome: Progressing   Problem: Health Behavior/Discharge Planning: Goal: Ability to safely manage health-related needs after discharge will improve Outcome: Progressing   Problem: Education: Goal: Knowledge of condition and prescribed therapy will improve Outcome: Progressing   Problem: Cardiac: Goal: Will achieve and/or maintain adequate cardiac output Outcome: Progressing   Problem: Physical Regulation: Goal: Complications related to the disease process, condition or treatment will be avoided or minimized Outcome: Progressing

## 2023-03-17 NOTE — Progress Notes (Addendum)
Occupational Therapy Treatment Patient Details Name: Dustin Hamilton MRN: [id number] DOB: 24-Mar-1941 Today's Date: 03/17/2023   History of present illness The pt is an 82 yo male presenting 10/12 with aphasia and AMS. CT with no acute abnormalities. Pt with significant fluctuations in BP, and with BG to 70 on admission, UA also with possible UTI. 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 PMH includes: dementia, glaucoma, L occipital lobe parenchymal hemorrhage, L hippocampus/amygdala infarct, hx of alcohol and IV drug use, HTN, pacemaker, recurrent C. Difficile, BPH.   OT comments  Pt progressing towards goals this session, more alert compared to evaluation. Bed soiled in urine upon arrival with pt unaware, pt able to complete seated/standing ADLs with min-mod A, min A for bed mobility and min-mod A for standing in stedy frame, pt able to stand x3 mins for bathing task and standing grooming task at sink. Pt with inattention to R side, cues to look to R to locate items but able to reach for therapist's hand and identify number held up in R visual field. Pt presenting with impairments listed below, will follow acutely. Patient will benefit from continued inpatient follow up therapy, <3 hours/day to maximize safety/ind with ADLs/functional mobility.       If plan is discharge home, recommend the following:  Two people to help with walking and/or transfers;A lot of help with bathing/dressing/bathroom;Assistance with cooking/housework;Direct supervision/assist for medications management;Direct supervision/assist for financial management;Assist for transportation;Supervision due to cognitive status;Help with stairs or ramp for entrance   Equipment Recommendations  Wheelchair (measurements OT);Wheelchair cushion (measurements OT);BSC/3in1    Recommendations for Other Services PT consult    Precautions / Restrictions  Precautions Precautions: Fall Precaution Comments: low vision Restrictions Weight Bearing Restrictions: No       Mobility Bed Mobility Overal bed mobility: Needs Assistance Bed Mobility: Sidelying to Sit, Sit to Sidelying   Sidelying to sit: Min assist     Sit to sidelying: Min assist      Transfers Overall transfer level: Needs assistance Equipment used: Ambulation equipment used Transfers: Sit to/from Stand Sit to Stand: Min assist, Mod assist           General transfer comment: mod A for initial stand in [place], then min A for standing after that Transfer via Lift Equipment: Stedy   Balance Overall balance assessment: Needs assistance Sitting-balance support: Feet supported, Bilateral upper extremity supported Sitting balance-Leahy Scale: Fair     Standing balance support: Bilateral upper extremity supported, During functional activity Standing balance-Leahy Scale: Poor Standing balance comment: reliant on stedy frame support                           ADL either performed or assessed with clinical judgement   ADL Overall ADL's : Needs assistance/impaired     Grooming: Wash/dry face;Minimal assistance;Standing           Upper Body Dressing : Sitting;Moderate assistance       Toilet Transfer: Minimal assistance Toilet Transfer Details (indicate cue type and reason): min A to stand in stedy for transfer         Functional mobility during ADLs: Minimal assistance;Moderate assistance      Extremity/Trunk Assessment Upper Extremity Assessment Upper Extremity Assessment: Generalized weakness   Lower Extremity Assessment Lower Extremity Assessment: Defer to PT evaluation        Vision   Vision Assessment?: Vision impaired- to be  further tested in functional context Additional Comments: can locate and reach for items on R side with verbal cues this session, still with inattention to R side; to further assess in future sessions.    Perception Perception Perception: Not tested   Praxis Praxis Praxis: Not tested    Cognition Arousal: Alert Behavior During Therapy: Flat affect Overall Cognitive Status: Impaired/Different from baseline Area of Impairment: Orientation, Attention, Memory, Following commands, Safety/judgement, Awareness, Problem solving                 Orientation Level: Disoriented to, Situation, Time Current Attention Level: Focused Memory: Decreased recall of precautions, Decreased short-term memory Following Commands: Follows one step commands inconsistently, Follows one step commands with increased time Safety/Judgement: Decreased awareness of safety, Decreased awareness of deficits Awareness: Intellectual Problem Solving: Slow processing, Requires tactile cues, Requires verbal cues General Comments: pleasant, needs repetition to follow single step instructions        Exercises      Shoulder Instructions       General Comments VSS    Pertinent Vitals/ Pain       Pain Assessment Pain Assessment: No/denies pain  Home Living                                          Prior Functioning/Environment              Frequency  Min 1X/week        Progress Toward Goals  OT Goals(current goals can now be found in the care plan section)  Progress towards OT goals: Progressing toward goals  Acute Rehab OT Goals Patient Stated Goal: family wants pt to go home vs rehab OT Goal Formulation: With patient/family Time For Goal Achievement: 03/29/23 Potential to Achieve Goals: Good ADL Goals Pt Will Perform Upper Body Dressing: with mod assist;with min assist;sitting;standing Pt Will Perform Lower Body Dressing: with min assist;sit to/from stand;sitting/lateral leans Pt Will Transfer to Toilet: with mod assist;ambulating;regular height toilet Additional ADL Goal #1: pt will perform bed mobility min A in prep for ADLs Additional ADL Goal #2: pt will demonstrate  3 low vision compensatory strategies in order to promote ind with ADLs  Plan      Co-evaluation                 AM-PAC OT "6 Clicks" Daily Activity     Outcome Measure   Help from another person eating meals?: A Lot Help from another person taking care of personal grooming?: A Little Help from another person toileting, which includes using toliet, bedpan, or urinal?: Total Help from another person bathing (including washing, rinsing, drying)?: A Lot Help from another person to put on and taking off regular upper body clothing?: A Lot Help from another person to put on and taking off regular lower body clothing?: A Lot 6 Click Score: 12    End of Session Equipment Utilized During Treatment: Gait belt;Other (comment) (stedy)  OT Visit Diagnosis: Unsteadiness on feet (R26.81);Other abnormalities of gait and mobility (R26.89);Muscle weakness (generalized) (M62.81);Other symptoms and signs involving the nervous system (R29.898);Other symptoms and signs involving cognitive function;Cognitive communication deficit (R41.841);Low vision, both eyes (H54.2) Symptoms and signs involving cognitive functions: Cerebral infarction   Activity Tolerance Patient tolerated treatment well   Patient Left in bed;with call bell/phone within reach;with family/visitor present   Nurse Communication Mobility status  Time: 1423-1500 OT Time Calculation (min): 37 min  Charges: OT General Charges $OT Visit: 1 Visit OT Treatments $Self Care/Home Management : 8-22 mins $Therapeutic Activity: 8-22 mins  Carver Fila, OTD, OTR/L SecureChat Preferred Acute Rehab [phone]   Carver Fila Koonce 03/17/2023, 3:13 PM

## 2023-03-17 NOTE — Progress Notes (Signed)
Patient off the floor with charge nurse.

## 2023-03-17 NOTE — Plan of Care (Signed)
Patient more alert and moving better per patient's wife.

## 2023-03-17 NOTE — Progress Notes (Signed)
Patient now back from MRI. Family at bedside.

## 2023-03-17 NOTE — Progress Notes (Signed)
Modified Barium Swallow Study  Patient Details  Name: Dustin Hamilton MRN: [id number] Date of Birth: 11-10-1940  Today's Date: 03/17/2023  Modified Barium Swallow completed.  Full report located under Chart Review in the Imaging Section.  History of Present Illness Dustin Hamilton is an 82 yo male presenting to ED 10/12 with AMS and acute onset of aphasia. UA with possible UTI. CXR with severe chronic elevation of R hemidiaphragm with increasing atelectasis. CTH negative. MRI Brain pending. MBS January 2024 with mild-moderate oropharyngeal dysphagia characterized by delayed swallow initiation, oropharyngeal residue resolved by cued multiple swallows, deep penetration of thin liquids. He was recommended to initiate a Dys 3 diet with nectar thick liquids at that time; however, pt's wife reports he does not like thickened liquids and is noncompliant with use. PMH includes cerebral amyloid angiopathy, L hippocampus/amygdala infarct, history of alcohol and IV drug use, history of ICH, dementia, HTN, recurrent C. Difficile, BPH   Clinical Impression Pt presents with a seemingly improved oropharyngeal swallow compared to previous MBS 06/11/22. He achieves good oral clearance and adequate BOT retraction with all consistencies administered. With solid consistencies, pt did have slow mastication. He continues to have trace and transient penetration with thin liquids (PAS 2), which does not appear to be as deep as noted previously. Nectar thick liquids via straw resulted in trace penetration that was unable to be cleared (PAS 3). Suspect pt's distaste for barium contributed to performance with thickened liquid trials. Pt achieves good pharyngeal clearance as well, with only trace amounts of residue present in the valleculae and pyriform sinuses and was cleared with the use of multiple swallows. Recommend continuing current diet of Dys 3 texture solids with thin liquids. Provided education to pt and his wife  regarding taking single, controlled sips and continuing modified diet upon d/c. SLP to s/o acutely, although pt may continue to benefit from HHSLP to target use of compensatory strategies. Factors that may increase risk of adverse event in presence of aspiration Rubye Oaks & Clearance Coots 2021): Reduced cognitive function  Swallow Evaluation Recommendations Recommendations: PO diet PO Diet Recommendation: Dysphagia 3 (Mechanical soft);Thin liquids (Level 0) Liquid Administration via: Cup;Straw Medication Administration: Whole meds with liquid Supervision: Staff to assist with self-feeding;Set-up assistance for safety Swallowing strategies  : Minimize environmental distractions;Slow rate;Small bites/sips;Multiple dry swallows after each bite/sip Postural changes: Position pt fully upright for meals Oral care recommendations: Oral care BID (2x/day)      Gwynneth Aliment, M.A., CF-SLP Speech Language Pathology, Acute Rehabilitation Services  Secure Chat preferred [phone]  03/17/2023,2:02 PM

## 2023-03-17 NOTE — Plan of Care (Signed)
MRI brain completed Reveals acute infarctions in bilateral PCA territories left greater than right involving bilateral occipital lobes.  There is acute infarction in the left hypothalamic region and innumerable supra and infratentorial microhemorrhages in lobar distribution in keeping with amyloid angiopathy history.   Needs further completion of stroke workup  - Has an echocardiogram completed-no need to repeat - CT angiography head and neck - A1c - Lipid panel - Therapy assessments - Blood pressure can be normalized and no need for permissive hypertension - For now would continue aspirin 81 for stroke prevention.  I would recommend SCDs for DVT prophylaxis.  Discontinue Lovenox. Stroke team will follow. Plan discussed with Dr. Thedore Mins.  -- Milon Dikes, MD Neurologist [facility] Pager: [phone]

## 2023-03-18 DIAGNOSIS — I63532 Cerebral infarction due to unspecified occlusion or stenosis of left posterior cerebral artery: Secondary | ICD-10-CM | POA: Diagnosis not present

## 2023-03-18 DIAGNOSIS — I639 Cerebral infarction, unspecified: Secondary | ICD-10-CM | POA: Diagnosis not present

## 2023-03-18 DIAGNOSIS — I680 Cerebral amyloid angiopathy: Secondary | ICD-10-CM

## 2023-03-18 DIAGNOSIS — R4182 Altered mental status, unspecified: Secondary | ICD-10-CM | POA: Diagnosis not present

## 2023-03-18 LAB — CBC WITH DIFFERENTIAL/PLATELET
Abs Immature Granulocytes: 0.02 10*3/uL (ref 0.00–0.07)
Basophils Absolute: 0 10*3/uL (ref 0.0–0.1)
Basophils Relative: 0 %
Eosinophils Absolute: 0.2 10*3/uL (ref 0.0–0.5)
Eosinophils Relative: 3 %
HCT: 44.4 % (ref 39.0–52.0)
Hemoglobin: 15.1 g/dL (ref 13.0–17.0)
Immature Granulocytes: 0 %
Lymphocytes Relative: 33 %
Lymphs Abs: 2 10*3/uL (ref 0.7–4.0)
MCH: 31 pg (ref 26.0–34.0)
MCHC: 34 g/dL (ref 30.0–36.0)
MCV: 91.2 fL (ref 80.0–100.0)
Monocytes Absolute: 0.5 10*3/uL (ref 0.1–1.0)
Monocytes Relative: 8 %
Neutro Abs: 3.3 10*3/uL (ref 1.7–7.7)
Neutrophils Relative %: 56 %
Platelets: 116 10*3/uL — ABNORMAL LOW (ref 150–400)
RBC: 4.87 MIL/uL (ref 4.22–5.81)
RDW: 14.1 % (ref 11.5–15.5)
WBC: 6 10*3/uL (ref 4.0–10.5)
nRBC: 0 % (ref 0.0–0.2)

## 2023-03-18 LAB — BRAIN NATRIURETIC PEPTIDE: B Natriuretic Peptide: 92.6 pg/mL (ref 0.0–100.0)

## 2023-03-18 LAB — LIPID PANEL
Cholesterol: 108 mg/dL (ref 0–200)
HDL: 30 mg/dL — ABNORMAL LOW (ref >40)
LDL Cholesterol: 67 mg/dL (ref 0–99)
Total CHOL/HDL Ratio: 3.6 {ratio}
Triglycerides: 55 mg/dL (ref <150)
VLDL: 11 mg/dL (ref 0–40)

## 2023-03-18 LAB — GLUCOSE, CAPILLARY
Glucose-Capillary: 137 mg/dL — ABNORMAL HIGH (ref 70–99)
Glucose-Capillary: 116 mg/dL — ABNORMAL HIGH (ref 70–99)
Glucose-Capillary: 154 mg/dL — ABNORMAL HIGH (ref 70–99)
Glucose-Capillary: 90 mg/dL (ref 70–99)

## 2023-03-18 LAB — BASIC METABOLIC PANEL
Anion gap: 10 (ref 5–15)
BUN: 13 mg/dL (ref 8–23)
CO2: 25 mmol/L (ref 22–32)
Calcium: 9.6 mg/dL (ref 8.9–10.3)
Chloride: 103 mmol/L (ref 98–111)
Creatinine, Ser: 0.97 mg/dL (ref 0.61–1.24)
GFR, Estimated: >60 mL/min (ref >60)
Glucose, Bld: 113 mg/dL — ABNORMAL HIGH (ref 70–99)
Potassium: 3.7 mmol/L (ref 3.5–5.1)
Sodium: 138 mmol/L (ref 135–145)

## 2023-03-18 LAB — PHOSPHORUS: Phosphorus: 2.6 mg/dL (ref 2.5–4.6)

## 2023-03-18 LAB — MAGNESIUM: Magnesium: 1.8 mg/dL (ref 1.7–2.4)

## 2023-03-18 MED ORDER — AMLODIPINE BESYLATE 5 MG PO TABS: 2.5000 mg | ORAL_TABLET | Freq: Every day | ORAL | Status: DC

## 2023-03-18 MED ORDER — HYDRALAZINE HCL 20 MG/ML IJ SOLN: 5.0000 mg | Freq: Three times a day (TID) | INTRAMUSCULAR | Status: DC | PRN

## 2023-03-18 MED ORDER — AMLODIPINE BESYLATE 5 MG PO TABS: 5.0000 mg | ORAL_TABLET | Freq: Every day | ORAL | Status: DC

## 2023-03-18 MED ORDER — AMLODIPINE BESYLATE 5 MG PO TABS
5.0000 mg | ORAL_TABLET | Freq: Every day | ORAL | Status: DC
  Administered 2023-03-18: 5 mg via ORAL
  Filled 2023-03-18: qty 1

## 2023-03-18 NOTE — Progress Notes (Signed)
Mobility Specialist Progress Note;   03/18/23 0930  Mobility  Activity Transferred to/from [facility]  Level of Assistance Minimal assist, patient does 75% or more (+2)  Assistive Device Other (Comment) (HHA +2)  Distance Ambulated (ft) 5 ft  Activity Response Tolerated well  Mobility Referral Yes  $Mobility charge 1 Mobility  Mobility Specialist Start Time (ACUTE ONLY) 0930  Mobility Specialist Stop Time (ACUTE ONLY) 0945  Mobility Specialist Time Calculation (min) (ACUTE ONLY) 15 min   Pt agreeable to mobility, requested assistance to [facility] Required minA +2 via HHA for short ambulation to [facility], void successful. Pericare performed. Asx throughout session. Pt back in chair with all needs met. Alarm on.   Caesar Bookman Mobility Specialist Please contact via SecureChat or Rehab Office [phone]

## 2023-03-18 NOTE — Plan of Care (Signed)
  Problem: Education: Goal: Understanding of CV disease, CV risk reduction, and recovery process will improve Outcome: Progressing   Problem: Activity: Goal: Ability to return to baseline activity level will improve Outcome: Progressing   Problem: Cardiovascular: Goal: Ability to achieve and maintain adequate cardiovascular perfusion will improve Outcome: Progressing   Problem: Health Behavior/Discharge Planning: Goal: Ability to safely manage health-related needs after discharge will improve Outcome: Progressing   Problem: Education: Goal: Knowledge of condition and prescribed therapy will improve Outcome: Progressing   Problem: Cardiac: Goal: Will achieve and/or maintain adequate cardiac output Outcome: Progressing   Problem: Physical Regulation: Goal: Complications related to the disease process, condition or treatment will be avoided or minimized Outcome: Progressing

## 2023-03-18 NOTE — Progress Notes (Addendum)
STROKE TEAM PROGRESS NOTE   BRIEF HPI [name] is a 82 y.o. male with history of CAA with multiple "amyloid spells"/transient focal neurologic deficits associated with CAA, prior history of ICH, essential hypertension presenting with acute onset of aphasia with symptoms lasting longer than patient's typical "amyloid spells" in the past.   SIGNIFICANT HOSPITAL EVENTS 10/13:  - rEEG suggestive of diffuse encephalopathy without seizures or epileptiform discharges. 10/15:  - MRI brain with acute infarcts in the bilateral PCA territory, left-greater-than-right, involving the bilateral occipital lobes. 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  Innumerable supra and infratentorial microhemorrhages in a lobar distribution, in keeping with patient's history of amyloid angiopathy.  Sequela of severe chronic microvascular ischemic change.  INTERIM HISTORY/SUBJECTIVE No family is present at bedside this morning. Patient is awake, sitting up in bedside recliner, confused, remains aphasic. Patient is in no acute distress.   OBJECTIVE CBC    Component Value Date/Time   WBC 6.0 03/18/2023 0322   RBC 4.87 03/18/2023 0322   HGB 15.1 03/18/2023 0322   HGB 17.2 11/12/2018 0950   HCT 44.4 03/18/2023 0322   HCT 52.0 (H) 11/12/2018 0950   PLT 116 (L) 03/18/2023 0322   PLT 144 (L) 11/12/2018 0950   MCV 91.2 03/18/2023 0322   MCV 92 11/12/2018 0950   MCH 31.0 03/18/2023 0322   MCHC 34.0 03/18/2023 0322   RDW 14.1 03/18/2023 0322   RDW 13.7 11/12/2018 0950   LYMPHSABS 2.0 03/18/2023 0322   LYMPHSABS 1.3 11/12/2018 0950   MONOABS 0.5 03/18/2023 0322   EOSABS 0.2 03/18/2023 0322   EOSABS 0.1 11/12/2018 0950   BASOSABS 0.0 03/18/2023 0322   BASOSABS 0.0 11/12/2018 0950   BMET    Component Value Date/Time   NA 138 03/18/2023 0322   NA 143 11/12/2018 0950   K 3.7 03/18/2023 0322   CL 103 03/18/2023 0322   CO2 25 03/18/2023 0322   GLUCOSE 113 (H) 03/18/2023 0322   BUN 13  03/18/2023 0322   BUN 15 11/12/2018 0950   CREATININE 0.97 03/18/2023 0322   CALCIUM 9.6 03/18/2023 0322   GFRNONAA >60 03/18/2023 0322   Lab Results  Component Value Date   CHOL 108 03/18/2023   HDL 30 (L) 03/18/2023   LDLCALC 67 03/18/2023   TRIG 55 03/18/2023   CHOLHDL 3.6 03/18/2023   Lab Results  Component Value Date   HGBA1C 5.8 (H) 03/16/2023   IMAGING past 24 hours CT ANGIO HEAD NECK W WO CM  Result Date: 03/17/2023 CLINICAL DATA:  Stroke, determine embolic source EXAM: CT ANGIOGRAPHY HEAD AND NECK WITH AND WITHOUT CONTRAST TECHNIQUE: Multidetector CT imaging of the head and neck was performed using the standard protocol during bolus administration of intravenous contrast. Multiplanar CT image reconstructions and MIPs were obtained to evaluate the vascular anatomy. Carotid stenosis measurements (when applicable) are obtained utilizing NASCET criteria, using the distal internal carotid diameter as the denominator. RADIATION DOSE REDUCTION: This exam was performed according to the departmental dose-optimization program which includes automated exposure control, adjustment of the mA and/or kV according to patient size and/or use of iterative reconstruction technique. CONTRAST:  75mL OMNIPAQUE IOHEXOL 350 MG/ML SOLN COMPARISON:  04/01/2021 CTA head and neck, correlation is also made with 03/14/2023 CT head and 03/17/2023 MRI head FINDINGS: CT HEAD FINDINGS Brain: Loss of gray-white differentiation in the left occipital lobe, which correlates with the acute infarct seen on the 03/17/2023 MRI. No other area of hypodensity suggest additional acute infarct. No  acute hemorrhage, mass, mass effect, or midline shift. No hydrocephalus or extra-axial collection. Unchanged ventriculomegaly. Periventricular white matter changes, likely the sequela of chronic small vessel ischemic disease. Vascular: No hyperdense vessel. Atherosclerotic calcifications in the intracranial carotid and vertebral arteries.  Skull: Negative for fracture or focal lesion. Sinuses/Orbits: No acute finding. Other: None. CTA NECK FINDINGS Aortic arch: Standard branching. Imaged portion shows no evidence of aneurysm or dissection. No significant stenosis of the major arch vessel origins. Right carotid system: No evidence of dissection, occlusion, or hemodynamically significant stenosis (greater than 50%). Left carotid system: No evidence of dissection, occlusion, or hemodynamically significant stenosis (greater than 50%). Vertebral arteries: No evidence of dissection, occlusion, or hemodynamically significant stenosis (greater than 50%). Skeleton: No acute osseous abnormality. Degenerative changes in the cervical spine. Other neck: Redemonstrated diffusely enlarged thyroid, which appears similar to the 05/01/2022 chest CT. Upper chest: No focal pulmonary opacity or pleural effusion. Review of the MIP images confirms the above findings CTA HEAD FINDINGS Anterior circulation: Both internal carotid arteries are patent to the termini, without significant stenosis. A1 segments patent. Normal anterior communicating artery. Anterior cerebral arteries are patent to their distal aspects without significant stenosis. No M1 stenosis or occlusion. MCA branches perfused to their distal aspects without significant stenosis. Posterior circulation: Vertebral arteries patent to the vertebrobasilar junction without significant stenosis. Posterior inferior cerebellar arteries patent proximally. Basilar patent to its distal aspect without significant stenosis. Superior cerebellar arteries patent proximally. Patent P1 segments. Occlusion of the proximal left P2 (series 11, images 106-111, with reconstitution in mid left P2; this is new compared to 2022. PCAs are otherwise perfused to their distal aspects without significant stenosis. The bilateral posterior communicating arteries are patent. Venous sinuses: As permitted by contrast timing, patent. Anatomic  variants: None significant. No evidence of aneurysm or vascular malformation. Review of the MIP images confirms the above findings IMPRESSION: 1. Occlusion of the proximal left P2, with reconstitution in mid left P2, which is new from the 2022 CTA. 2. No other intracranial large vessel occlusion or significant stenosis. 3. No hemodynamically significant stenosis in the neck. 4. Loss of gray-white differentiation in the left occipital lobe, which correlates with the acute infarct seen on the 03/17/2023 MRI. Insert peer E: Port Electronically Signed   By: Wiliam Ke M.D.   On: 03/17/2023 23:47    Vitals:   03/18/23 0336 03/18/23 0418 03/18/23 0821 03/18/23 1151  BP: (!) 154/104 (!) 152/108 118/82 101/60  Pulse: 80  (!) 103 93  Resp: 16  13 17   Temp: 97.9 F (36.6 C)  98.1 F (36.7 C) 97.7 F (36.5 C)  TempSrc: Oral  Oral Oral  SpO2: 92%     Weight:      Height:       PHYSICAL EXAM General:  Alert, well-nourished, well-developed African-American male in no acute distress Psych:  Mood and affect appropriate for situation, patient is calm and cooperative with exam.  Pleasantly confused. CV: Regular rate and rhythm on monitor Respiratory:  Regular, unlabored respirations on room air GI: Abdomen soft and nontender  NEURO:  Mental Status: Patient is alert to self only.  When asked his age, he reports "50 something".  Patient is unable to state the month, year, location, or situation. Patient follows simple commands with repeat instruction and coaching.  Speech/Language: speech is without dysarthria.  Patient is unable to name objects.  When examiner points to patient's thumb he states "my finger" but is unable to name the thumb.  When attempting to name "ring" patient names "picture". When asked to name objects on stroke card he repeatedly states "oh my".   Cranial Nerves:  II: PERRL. Does not blink to threat on the right.  III, IV, VI: Will track examiner to the left and right.  V:  Sensation is intact to light touch and symmetrical to face.  VII: Face is symmetrical resting and and with movement VIII: Hearing is intact to voice. IX, X: Palate elevates symmetrically. Phonation is normal.  XI: Shoulder shrug is equal XII: Tongue protrudes midline. Motor: 5/5 strength to all muscle groups tested.  Tone: is normal and bulk is normal Sensation: Intact to light touch bilaterally.  Coordination: Does not perform. Gait: Deferred  ASSESSMENT/PLAN Stroke - Acute left PCA infarct, etiology: Likely large vessel disease  CT head 10/12: No acute finding MRI brain: Acute infarcts in the bilateral PCA territory, left greater than right, involving the bilateral occipital lobes.  Supra and infratentorial microhemorrhages in a lobar distribution, in keeping with patient's history of amyloid angiopathy.  Sequelae of severe chronic microvascular ischemic change. CT angio head and neck 10/15: Occlusion of the proximal left P2, with reconstitution and mid left P2, which is new from the 2022 CTA.   2D Echo LVEF 60-65%  LDL 67 HgbA1c 5.8 UDS negative VTE prophylaxis - SCDs No antithrombotic prior to admission, now on aspirin 81 mg daily only due to history of ICH with a known background of CAA.  Therapy recommendations: SNF Disposition:  pending  Hx of small left occipital ICH 06/2004 CAA diagnosed in 2021 History of ischemic strokes 2022, 2020, and 2014 Acute microhemorrhage on MRI brain 03/2021 Atrial flutter history not on AC due to history of CAA with ICH CAA "spells" with episode of unresponsiveness, extensive unremarkable work up including neuroimaging and EEGs in the past Spells described as transient confusion and aphasia Now on aspirin 81 only Strict BP measurement, BP goal less than 140  Atrial flutter Home Meds: None due to CAA with multiple chronic microhemorrhages and history of ICH Continue telemetry monitoring  Hypertension Home meds:  Toprol-XL Stable On  metoprolol 12.5 and Norvasc 5 SBP <140  Long term blood pressure goal < 140 given CAA  Lipid management Home meds:  none LDL 67, goal < 70 No statin given LDL goal and known CAA  UTI UA WBC 21-50 On Rocephin  Other Stroke Risk Factors Family hx stroke (Mother, maternal grandmother) Advanced age  Other Active Problems Dementia on Namenda Remote alcohol abuse, 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 Pt was seen and examined.   Wife at the bedside.  Patient lying in bed, eyes open, awake alert, pleasant, greeting to me when I arrived.  He is orientated to self but not to situation, time, age or place.  Right hemianopia, able to track bilaterally, no significant facial droop, tongue midline.  Moving all extremities symmetrically.  However, wife reported patient cannot walk well with PT and OT.  So far, they recommend SNF.  Patient has known CAA with history of ICH and multiple microbleed's on MRI.  Okay to continue aspirin 81 for stroke prevention but no escalation of antithrombotic regimen.  No statin given LDL at goal and CAA.  Patient this presentation also resembles his amyloid spells, recommend  strict BP management, BP goal less than 140.  For detailed assessment and plan, please refer to above/below as I have made changes wherever appropriate.   Neurology will sign off. Please call with questions. Pt will follow up with Dr. Marjory Lies at [facility] in about 4 weeks. Thanks for the consult.   Marvel Plan, MD PhD Stroke Neurology 03/18/2023 7:40 PM    To contact Stroke Continuity provider, please refer to [email or web address]. After hours, contact General Neurology

## 2023-03-18 NOTE — Progress Notes (Signed)
PROGRESS NOTE                                                                                                                                                                                                             Patient Demographics:    Dustin Hamilton, is a 82 y.o. male, DOB - 06/24/1940, VWU:[id number]  Outpatient Primary [facility] is [facility]    LOS - 2  Admit date - 03/14/2023    Chief Complaint  Patient presents with   Altered Mental Status       Brief Narrative (HPI from H&P)     82 y.o. 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  He was completely unresponsive on arrival.  His wife told ED physician that she went to take a shower around 2:15-2:30 PM and at that time patient was at his baseline.  When she checked on the patient at 4:50 PM, he was found unresponsive/collapsed at the dining room table.  In the ER head CT nonacute, EEG unremarkable, MRI ordered seen by neurology and admitted to the hospital.   Subjective:   Patient sitting in chair, comfortable, denies any headache, fever, chills, denies any complaints today.     Assessment  & Plan :    Altered mental status/unresponsiveness, metabolic encephalopathy   - Due to combination of UTI, dehydration, hypotension and mild hypoglycemia. -Secondary to acute CVA.  Acute CVA -MRI brain showing acute infarcts in bilateral PCA territory, left greater than right, involving bilateral occipital lobes, and acute infarct in the left hypothalamic region. -I have discussed with neurology, patient with  known history of ICH, secondary to brain amyloidosis, high risk for brain bleed, so recommendation for now is just to continue with aspirin. -PT/OT is following.   Hypotension with UTI and dehydration.  Hydrate with IV fluids, IV Rocephin and monitor.  Urine culture growing Staph epidermidis   Borderline hypoglycemia - Glucose now improved after oral intake.  Patient is not on any diabetes medications, last A1c 5.5 in April 2023.  Suspect hypoglycemia is due to poor p.o.  without significant stenosis. A1 segments patent. Normal anterior communicating artery. Anterior cerebral arteries are patent to their distal aspects without significant stenosis. No M1 stenosis or occlusion. MCA branches perfused to their distal aspects without significant stenosis. Posterior circulation: Vertebral arteries patent to the vertebrobasilar junction without significant stenosis. Posterior inferior cerebellar arteries patent proximally. Basilar patent to its distal aspect without significant stenosis. Superior cerebellar arteries patent proximally. Patent P1 segments. Occlusion of the proximal left P2 (series 11, images 106-111, with reconstitution in mid left P2; this is new compared to 2022. PCAs are otherwise perfused to their distal aspects without significant stenosis. The bilateral posterior communicating arteries are patent. Venous sinuses: As permitted by contrast timing, patent. Anatomic variants: None significant. No evidence of aneurysm or vascular malformation. Review of the MIP images confirms the above findings IMPRESSION: 1. Occlusion of the proximal left P2, with reconstitution in mid left P2, which is new from the 2022 CTA. 2. No other intracranial large vessel occlusion or significant stenosis. 3. No hemodynamically significant stenosis in the neck. 4. Loss of gray-white differentiation in the left occipital lobe, which correlates with the acute infarct seen on the 03/17/2023 MRI. Insert peer E: Port Electronically Signed   By: Wiliam Ke M.D.   On: 03/17/2023 23:47   DG Swallowing Func-Speech Pathology  Result Date: 03/17/2023 Table formatting from the original result was not included. Modified Barium Swallow Study  Patient Details Name: Dustin Hamilton MRN: [id number] Date of Birth: Apr 28, 1941 Today's Date: 03/17/2023 HPI/PMH: HPI: Dustin Hamilton is an 82 yo male presenting to ED 10/12 with AMS and acute onset of aphasia. UA with possible UTI. CXR with severe chronic elevation of R hemidiaphragm with increasing atelectasis. CTH negative. MRI Brain pending. MBS January 2024 with mild-moderate oropharyngeal dysphagia characterized by delayed swallow initiation, oropharyngeal residue resolved by cued multiple swallows, deep penetration of thin liquids. He was recommended to initiate a Dys 3 diet with nectar thick liquids at that time; however, pt's wife reports he does not like thickened liquids and is noncompliant with use. PMH includes cerebral amyloid angiopathy, L hippocampus/amygdala infarct, history of alcohol and IV drug use, history of ICH, dementia, HTN, recurrent C. Difficile, BPH Clinical Impression: Clinical Impression: Pt presents with a seemingly improved oropharyngeal swallow compared to previous MBS 06/11/22. He achieves good oral clearance and adequate BOT retraction with all consistencies administered. With solid consistencies, pt did have slow mastication. He continues to have trace and transient penetration with thin liquids (PAS 2), which does not appear to be as deep as noted previously. Nectar thick liquids via straw resulted in trace penetration that was unable to be cleared (PAS 3). Suspect pt's distaste for barium contributed to performance with thickened liquid trials. Pt achieves good pharyngeal clearance as well, with only trace amounts of residue present in the valleculae and pyriform sinuses and was cleared with the use of multiple swallows. Recommend continuing current diet of Dys 3 texture solids with thin liquids. Provided education to pt and his wife regarding taking single, controlled sips and continuing modified diet upon d/c. SLP to s/o acutely, although pt may continue to benefit from  HHSLP to target use of compensatory strategies. Factors that may increase risk of adverse event in presence of aspiration Rubye Oaks & Clearance Coots 2021): Factors that may increase risk of adverse event in presence of aspiration Rubye Oaks & Clearance Coots 2021): Reduced cognitive function Recommendations/Plan: Swallowing Evaluation Recommendations Swallowing Evaluation Recommendations Recommendations: PO diet PO Diet Recommendation: Dysphagia 3 (Mechanical soft); Thin liquids (  in the left region (series 5, image 75) there are innumerable supra and infratentorial microhemorrhages in a lobar distribution, in keeping with patient's history of amyloid angiopathy. There is sequela of severe chronic microvascular ischemic change. Ventriculomegaly, proportional to the degree of volume loss. No mass effect. No mass lesion. No extra-axial fluid collection. Vascular: Normal flow voids. Skull and upper cervical spine: Normal marrow signal. Sinuses/Orbits: No middle ear effusion. Small bilateral mastoid effusions. Mucosal thickening bilateral ethmoid sinuses. Bilateral lens replacement. Orbits are otherwise unremarkable. Other: None. IMPRESSION: 1. 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 2. 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 3. Innumerable supra and infratentorial microhemorrhages in a lobar distribution, in keeping with patient's history of amyloid angiopathy. 4. Sequela of severe chronic microvascular ischemic change. Electronically Signed   By: Lorenza Cambridge M.D.   On: 03/17/2023 13:02   ECHOCARDIOGRAM COMPLETE  Result Date: 03/15/2023    ECHOCARDIOGRAM  REPORT   Patient Name:   Dustin Hamilton Date of Exam: 03/15/2023 Medical Rec #:  [id number]          Height:       71.0 in Accession #:    [id number]         Weight:       155.0 lb Date of Birth:  [birth year]          BSA:          1.892 m Patient Age:    82 years           BP:           141/85 mmHg Patient Gender: M                  HR:           58 bpm. Exam Location:  Inpatient Procedure: 2D Echo, Color Doppler, Cardiac Doppler and Intracardiac            Opacification Agent Indications:    Syncope  History:        Patient has prior history of Echocardiogram examinations, most                 recent 09/04/2021. Pacemaker and AVNRT ablation, Arrythmias:CHB,                 SVT, Signs/Symptoms:Syncope; Risk Factors:Hypertension.  Sonographer:    Milbert Coulter Referring Phys: John Giovanni  Sonographer Comments: Technically difficult study due to poor echo windows, suboptimal parasternal window, no apical window and no subcostal window. Image acquisition challenging due to patient body habitus and Image acquisition challenging due to respiratory motion. IMPRESSIONS  1. Left ventricular ejection fraction, by estimation, is 60 to 65%. The left ventricle has normal function. Left ventricular endocardial border not optimally defined to evaluate regional wall motion. There is mild left ventricular hypertrophy. Left ventricular diastolic function could not be evaluated.  2. Right ventricular systolic function is normal. The right ventricular size is mildly enlarged.  3. A small pericardial effusion is present.  4. The mitral valve is grossly normal. No evidence of mitral valve regurgitation.  5. The aortic valve is grossly normal. There is mild calcification of the aortic valve. Aortic valve regurgitation is not visualized. No aortic stenosis is present. FINDINGS  Left Ventricle: Left ventricular ejection fraction, by estimation, is 60 to 65%. The left ventricle has normal function. Left ventricular endocardial border  not optimally defined to evaluate regional wall motion. Definity contrast agent was given  PROGRESS NOTE                                                                                                                                                                                                             Patient Demographics:    Dustin Hamilton, is a 82 y.o. male, DOB - 06/24/1940, VWU:[id number]  [facility]patient [facility]    LOS - 2  Admit date - 03/14/2023    Chief Complaint  Patient presents with   Altered Mental Status       Brief Narrative (HPI from H&P)     82 y.o. 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  He was completely unresponsive on arrival.  His wife told ED physician that she went to take a shower around 2:15-2:30 PM and at that time patient was at his baseline.  When she checked on the patient at 4:50 PM, he was found unresponsive/collapsed at the dining room table.  In the ER head CT nonacute, EEG unremarkable, MRI ordered seen by neurology and admitted to the hospital.   Subjective:   Patient sitting in chair, comfortable, denies any headache, fever, chills, denies any complaints today.     Assessment  & Plan :    Altered mental status/unresponsiveness, metabolic encephalopathy   - Due to combination of UTI, dehydration, hypotension and mild hypoglycemia. -Secondary to acute CVA.  Acute CVA -MRI brain showing acute infarcts in bilateral PCA territory, left greater than right, involving bilateral occipital lobes, and acute infarct in the left hypothalamic region. -I have discussed with neurology, patient with  known history of ICH, secondary to brain amyloidosis, high risk for brain bleed, so recommendation for now is just to continue with aspirin. -PT/OT is following.   Hypotension with UTI and dehydration.  Hydrate with IV fluids, IV Rocephin and monitor.  Urine culture growing Staph epidermidis   Borderline hypoglycemia - Glucose now improved after oral intake.  Patient is not on any diabetes medications, last A1c 5.5 in April 2023.  Suspect hypoglycemia is due to poor p.o.  without significant stenosis. A1 segments patent. Normal anterior communicating artery. Anterior cerebral arteries are patent to their distal aspects without significant stenosis. No M1 stenosis or occlusion. MCA branches perfused to their distal aspects without significant stenosis. Posterior circulation: Vertebral arteries patent to the vertebrobasilar junction without significant stenosis. Posterior inferior cerebellar arteries patent proximally. Basilar patent to its distal aspect without significant stenosis. Superior cerebellar arteries patent proximally. Patent P1 segments. Occlusion of the proximal left P2 (series 11, images 106-111, with reconstitution in mid left P2; this is new compared to 2022. PCAs are otherwise perfused to their distal aspects without significant stenosis. The bilateral posterior communicating arteries are patent. Venous sinuses: As permitted by contrast timing, patent. Anatomic variants: None significant. No evidence of aneurysm or vascular malformation. Review of the MIP images confirms the above findings IMPRESSION: 1. Occlusion of the proximal left P2, with reconstitution in mid left P2, which is new from the 2022 CTA. 2. No other intracranial large vessel occlusion or significant stenosis. 3. No hemodynamically significant stenosis in the neck. 4. Loss of gray-white differentiation in the left occipital lobe, which correlates with the acute infarct seen on the 03/17/2023 MRI. Insert peer E: Port Electronically Signed   By: Wiliam Ke M.D.   On: 03/17/2023 23:47   DG Swallowing Func-Speech Pathology  Result Date: 03/17/2023 Table formatting from the original result was not included. Modified Barium Swallow Study  Patient Details Name: Dustin Hamilton MRN: [id number] Date of Birth: Apr 28, 1941 Today's Date: 03/17/2023 HPI/PMH: HPI: Dustin Hamilton is an 82 yo male presenting to ED 10/12 with AMS and acute onset of aphasia. UA with possible UTI. CXR with severe chronic elevation of R hemidiaphragm with increasing atelectasis. CTH negative. MRI Brain pending. MBS January 2024 with mild-moderate oropharyngeal dysphagia characterized by delayed swallow initiation, oropharyngeal residue resolved by cued multiple swallows, deep penetration of thin liquids. He was recommended to initiate a Dys 3 diet with nectar thick liquids at that time; however, pt's wife reports he does not like thickened liquids and is noncompliant with use. PMH includes cerebral amyloid angiopathy, L hippocampus/amygdala infarct, history of alcohol and IV drug use, history of ICH, dementia, HTN, recurrent C. Difficile, BPH Clinical Impression: Clinical Impression: Pt presents with a seemingly improved oropharyngeal swallow compared to previous MBS 06/11/22. He achieves good oral clearance and adequate BOT retraction with all consistencies administered. With solid consistencies, pt did have slow mastication. He continues to have trace and transient penetration with thin liquids (PAS 2), which does not appear to be as deep as noted previously. Nectar thick liquids via straw resulted in trace penetration that was unable to be cleared (PAS 3). Suspect pt's distaste for barium contributed to performance with thickened liquid trials. Pt achieves good pharyngeal clearance as well, with only trace amounts of residue present in the valleculae and pyriform sinuses and was cleared with the use of multiple swallows. Recommend continuing current diet of Dys 3 texture solids with thin liquids. Provided education to pt and his wife regarding taking single, controlled sips and continuing modified diet upon d/c. SLP to s/o acutely, although pt may continue to benefit from  HHSLP to target use of compensatory strategies. Factors that may increase risk of adverse event in presence of aspiration Rubye Oaks & Clearance Coots 2021): Factors that may increase risk of adverse event in presence of aspiration Rubye Oaks & Clearance Coots 2021): Reduced cognitive function Recommendations/Plan: Swallowing Evaluation Recommendations Swallowing Evaluation Recommendations Recommendations: PO diet PO Diet Recommendation: Dysphagia 3 (Mechanical soft); Thin liquids (  in the left region (series 5, image 75) there are innumerable supra and infratentorial microhemorrhages in a lobar distribution, in keeping with patient's history of amyloid angiopathy. There is sequela of severe chronic microvascular ischemic change. Ventriculomegaly, proportional to the degree of volume loss. No mass effect. No mass lesion. No extra-axial fluid collection. Vascular: Normal flow voids. Skull and upper cervical spine: Normal marrow signal. Sinuses/Orbits: No middle ear effusion. Small bilateral mastoid effusions. Mucosal thickening bilateral ethmoid sinuses. Bilateral lens replacement. Orbits are otherwise unremarkable. Other: None. IMPRESSION: 1. 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 2. 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 3. Innumerable supra and infratentorial microhemorrhages in a lobar distribution, in keeping with patient's history of amyloid angiopathy. 4. Sequela of severe chronic microvascular ischemic change. Electronically Signed   By: Lorenza Cambridge M.D.   On: 03/17/2023 13:02   ECHOCARDIOGRAM COMPLETE  Result Date: 03/15/2023    ECHOCARDIOGRAM  REPORT   Patient Name:   Dustin Hamilton Date of Exam: 03/15/2023 Medical Rec #:  [id number]          Height:       71.0 in Accession #:    [id number]         Weight:       155.0 lb Date of Birth:  [birth year]          BSA:          1.892 m Patient Age:    82 years           BP:           141/85 mmHg Patient Gender: M                  HR:           58 bpm. Exam Location:  Inpatient Procedure: 2D Echo, Color Doppler, Cardiac Doppler and Intracardiac            Opacification Agent Indications:    Syncope  History:        Patient has prior history of Echocardiogram examinations, most                 recent 09/04/2021. Pacemaker and AVNRT ablation, Arrythmias:CHB,                 SVT, Signs/Symptoms:Syncope; Risk Factors:Hypertension.  Sonographer:    Milbert Coulter Referring Phys: John Giovanni  Sonographer Comments: Technically difficult study due to poor echo windows, suboptimal parasternal window, no apical window and no subcostal window. Image acquisition challenging due to patient body habitus and Image acquisition challenging due to respiratory motion. IMPRESSIONS  1. Left ventricular ejection fraction, by estimation, is 60 to 65%. The left ventricle has normal function. Left ventricular endocardial border not optimally defined to evaluate regional wall motion. There is mild left ventricular hypertrophy. Left ventricular diastolic function could not be evaluated.  2. Right ventricular systolic function is normal. The right ventricular size is mildly enlarged.  3. A small pericardial effusion is present.  4. The mitral valve is grossly normal. No evidence of mitral valve regurgitation.  5. The aortic valve is grossly normal. There is mild calcification of the aortic valve. Aortic valve regurgitation is not visualized. No aortic stenosis is present. FINDINGS  Left Ventricle: Left ventricular ejection fraction, by estimation, is 60 to 65%. The left ventricle has normal function. Left ventricular endocardial border  not optimally defined to evaluate regional wall motion. Definity contrast agent was given  PROGRESS NOTE                                                                                                                                                                                                             Patient Demographics:    Dustin Hamilton, is a 82 y.o. male, DOB - 06/24/1940, VWU:[id number]  [facility]    LOS - 2  Admit date - 03/14/2023    Chief Complaint  Patient presents with   Altered Mental Status       Brief Narrative (HPI from H&P)     82 y.o. 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  He was completely unresponsive on arrival.  His wife told ED physician that she went to take a shower around 2:15-2:30 PM and at that time patient was at his baseline.  When she checked on the patient at 4:50 PM, he was found unresponsive/collapsed at the dining room table.  In the ER head CT nonacute, EEG unremarkable, MRI ordered seen by neurology and admitted to the hospital.   Subjective:   Patient sitting in chair, comfortable, denies any headache, fever, chills, denies any complaints today.     Assessment  & Plan :    Altered mental status/unresponsiveness, metabolic encephalopathy   - Due to combination of UTI, dehydration, hypotension and mild hypoglycemia. -Secondary to acute CVA.  Acute CVA -MRI brain showing acute infarcts in bilateral PCA territory, left greater than right, involving bilateral occipital lobes, and acute infarct in the left hypothalamic region. -I have discussed with neurology, patient with  known history of ICH, secondary to brain amyloidosis, high risk for brain bleed, so recommendation for now is just to continue with aspirin. -PT/OT is following.   Hypotension with UTI and dehydration.  Hydrate with IV fluids, IV Rocephin and monitor.  Urine culture growing Staph epidermidis   Borderline hypoglycemia - Glucose now improved after oral intake.  Patient is not on any diabetes medications, last A1c 5.5 in April 2023.  Suspect hypoglycemia is due to poor p.o.  without significant stenosis. A1 segments patent. Normal anterior communicating artery. Anterior cerebral arteries are patent to their distal aspects without significant stenosis. No M1 stenosis or occlusion. MCA branches perfused to their distal aspects without significant stenosis. Posterior circulation: Vertebral arteries patent to the vertebrobasilar junction without significant stenosis. Posterior inferior cerebellar arteries patent proximally. Basilar patent to its distal aspect without significant stenosis. Superior cerebellar arteries patent proximally. Patent P1 segments. Occlusion of the proximal left P2 (series 11, images 106-111, with reconstitution in mid left P2; this is new compared to 2022. PCAs are otherwise perfused to their distal aspects without significant stenosis. The bilateral posterior communicating arteries are patent. Venous sinuses: As permitted by contrast timing, patent. Anatomic variants: None significant. No evidence of aneurysm or vascular malformation. Review of the MIP images confirms the above findings IMPRESSION: 1. Occlusion of the proximal left P2, with reconstitution in mid left P2, which is new from the 2022 CTA. 2. No other intracranial large vessel occlusion or significant stenosis. 3. No hemodynamically significant stenosis in the neck. 4. Loss of gray-white differentiation in the left occipital lobe, which correlates with the acute infarct seen on the 03/17/2023 MRI. Insert peer E: Port Electronically Signed   By: Wiliam Ke M.D.   On: 03/17/2023 23:47   DG Swallowing Func-Speech Pathology  Result Date: 03/17/2023 Table formatting from the original result was not included. Modified Barium Swallow Study  Patient Details Name: Dustin Hamilton MRN: [id number] Date of Birth: Apr 28, 1941 Today's Date: 03/17/2023 HPI/PMH: HPI: Dustin Hamilton is an 82 yo male presenting to ED 10/12 with AMS and acute onset of aphasia. UA with possible UTI. CXR with severe chronic elevation of R hemidiaphragm with increasing atelectasis. CTH negative. MRI Brain pending. MBS January 2024 with mild-moderate oropharyngeal dysphagia characterized by delayed swallow initiation, oropharyngeal residue resolved by cued multiple swallows, deep penetration of thin liquids. He was recommended to initiate a Dys 3 diet with nectar thick liquids at that time; however, pt's wife reports he does not like thickened liquids and is noncompliant with use. PMH includes cerebral amyloid angiopathy, L hippocampus/amygdala infarct, history of alcohol and IV drug use, history of ICH, dementia, HTN, recurrent C. Difficile, BPH Clinical Impression: Clinical Impression: Pt presents with a seemingly improved oropharyngeal swallow compared to previous MBS 06/11/22. He achieves good oral clearance and adequate BOT retraction with all consistencies administered. With solid consistencies, pt did have slow mastication. He continues to have trace and transient penetration with thin liquids (PAS 2), which does not appear to be as deep as noted previously. Nectar thick liquids via straw resulted in trace penetration that was unable to be cleared (PAS 3). Suspect pt's distaste for barium contributed to performance with thickened liquid trials. Pt achieves good pharyngeal clearance as well, with only trace amounts of residue present in the valleculae and pyriform sinuses and was cleared with the use of multiple swallows. Recommend continuing current diet of Dys 3 texture solids with thin liquids. Provided education to pt and his wife regarding taking single, controlled sips and continuing modified diet upon d/c. SLP to s/o acutely, although pt may continue to benefit from  HHSLP to target use of compensatory strategies. Factors that may increase risk of adverse event in presence of aspiration Rubye Oaks & Clearance Coots 2021): Factors that may increase risk of adverse event in presence of aspiration Rubye Oaks & Clearance Coots 2021): Reduced cognitive function Recommendations/Plan: Swallowing Evaluation Recommendations Swallowing Evaluation Recommendations Recommendations: PO diet PO Diet Recommendation: Dysphagia 3 (Mechanical soft); Thin liquids (  in the left region (series 5, image 75) there are innumerable supra and infratentorial microhemorrhages in a lobar distribution, in keeping with patient's history of amyloid angiopathy. There is sequela of severe chronic microvascular ischemic change. Ventriculomegaly, proportional to the degree of volume loss. No mass effect. No mass lesion. No extra-axial fluid collection. Vascular: Normal flow voids. Skull and upper cervical spine: Normal marrow signal. Sinuses/Orbits: No middle ear effusion. Small bilateral mastoid effusions. Mucosal thickening bilateral ethmoid sinuses. Bilateral lens replacement. Orbits are otherwise unremarkable. Other: None. IMPRESSION: 1. 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 2. 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 3. Innumerable supra and infratentorial microhemorrhages in a lobar distribution, in keeping with patient's history of amyloid angiopathy. 4. Sequela of severe chronic microvascular ischemic change. Electronically Signed   By: Lorenza Cambridge M.D.   On: 03/17/2023 13:02   ECHOCARDIOGRAM COMPLETE  Result Date: 03/15/2023    ECHOCARDIOGRAM  REPORT   Patient Name:   Dustin Hamilton Date of Exam: 03/15/2023 Medical Rec #:  [id number]          Height:       71.0 in Accession #:    [id number]         Weight:       155.0 lb Date of Birth:  [birth year]          BSA:          1.892 m Patient Age:    82 years           BP:           141/85 mmHg Patient Gender: M                  HR:           58 bpm. Exam Location:  Inpatient Procedure: 2D Echo, Color Doppler, Cardiac Doppler and Intracardiac            Opacification Agent Indications:    Syncope  History:        Patient has prior history of Echocardiogram examinations, most                 recent 09/04/2021. Pacemaker and AVNRT ablation, Arrythmias:CHB,                 SVT, Signs/Symptoms:Syncope; Risk Factors:Hypertension.  Sonographer:    Milbert Coulter Referring Phys: John Giovanni  Sonographer Comments: Technically difficult study due to poor echo windows, suboptimal parasternal window, no apical window and no subcostal window. Image acquisition challenging due to patient body habitus and Image acquisition challenging due to respiratory motion. IMPRESSIONS  1. Left ventricular ejection fraction, by estimation, is 60 to 65%. The left ventricle has normal function. Left ventricular endocardial border not optimally defined to evaluate regional wall motion. There is mild left ventricular hypertrophy. Left ventricular diastolic function could not be evaluated.  2. Right ventricular systolic function is normal. The right ventricular size is mildly enlarged.  3. A small pericardial effusion is present.  4. The mitral valve is grossly normal. No evidence of mitral valve regurgitation.  5. The aortic valve is grossly normal. There is mild calcification of the aortic valve. Aortic valve regurgitation is not visualized. No aortic stenosis is present. FINDINGS  Left Ventricle: Left ventricular ejection fraction, by estimation, is 60 to 65%. The left ventricle has normal function. Left ventricular endocardial border  not optimally defined to evaluate regional wall motion. Definity contrast agent was given  without significant stenosis. A1 segments patent. Normal anterior communicating artery. Anterior cerebral arteries are patent to their distal aspects without significant stenosis. No M1 stenosis or occlusion. MCA branches perfused to their distal aspects without significant stenosis. Posterior circulation: Vertebral arteries patent to the vertebrobasilar junction without significant stenosis. Posterior inferior cerebellar arteries patent proximally. Basilar patent to its distal aspect without significant stenosis. Superior cerebellar arteries patent proximally. Patent P1 segments. Occlusion of the proximal left P2 (series 11, images 106-111, with reconstitution in mid left P2; this is new compared to 2022. PCAs are otherwise perfused to their distal aspects without significant stenosis. The bilateral posterior communicating arteries are patent. Venous sinuses: As permitted by contrast timing, patent. Anatomic variants: None significant. No evidence of aneurysm or vascular malformation. Review of the MIP images confirms the above findings IMPRESSION: 1. Occlusion of the proximal left P2, with reconstitution in mid left P2, which is new from the 2022 CTA. 2. No other intracranial large vessel occlusion or significant stenosis. 3. No hemodynamically significant stenosis in the neck. 4. Loss of gray-white differentiation in the left occipital lobe, which correlates with the acute infarct seen on the 03/17/2023 MRI. Insert peer E: Port Electronically Signed   By: Wiliam Ke M.D.   On: 03/17/2023 23:47   DG Swallowing Func-Speech Pathology  Result Date: 03/17/2023 Table formatting from the original result was not included. Modified Barium Swallow Study  Patient Details Name: Dustin Hamilton MRN: [id number] Date of Birth: Apr 28, 1941 Today's Date: 03/17/2023 HPI/PMH: HPI: Dustin Hamilton is an 82 yo male presenting to ED 10/12 with AMS and acute onset of aphasia. UA with possible UTI. CXR with severe chronic elevation of R hemidiaphragm with increasing atelectasis. CTH negative. MRI Brain pending. MBS January 2024 with mild-moderate oropharyngeal dysphagia characterized by delayed swallow initiation, oropharyngeal residue resolved by cued multiple swallows, deep penetration of thin liquids. He was recommended to initiate a Dys 3 diet with nectar thick liquids at that time; however, pt's wife reports he does not like thickened liquids and is noncompliant with use. PMH includes cerebral amyloid angiopathy, L hippocampus/amygdala infarct, history of alcohol and IV drug use, history of ICH, dementia, HTN, recurrent C. Difficile, BPH Clinical Impression: Clinical Impression: Pt presents with a seemingly improved oropharyngeal swallow compared to previous MBS 06/11/22. He achieves good oral clearance and adequate BOT retraction with all consistencies administered. With solid consistencies, pt did have slow mastication. He continues to have trace and transient penetration with thin liquids (PAS 2), which does not appear to be as deep as noted previously. Nectar thick liquids via straw resulted in trace penetration that was unable to be cleared (PAS 3). Suspect pt's distaste for barium contributed to performance with thickened liquid trials. Pt achieves good pharyngeal clearance as well, with only trace amounts of residue present in the valleculae and pyriform sinuses and was cleared with the use of multiple swallows. Recommend continuing current diet of Dys 3 texture solids with thin liquids. Provided education to pt and his wife regarding taking single, controlled sips and continuing modified diet upon d/c. SLP to s/o acutely, although pt may continue to benefit from  HHSLP to target use of compensatory strategies. Factors that may increase risk of adverse event in presence of aspiration Rubye Oaks & Clearance Coots 2021): Factors that may increase risk of adverse event in presence of aspiration Rubye Oaks & Clearance Coots 2021): Reduced cognitive function Recommendations/Plan: Swallowing Evaluation Recommendations Swallowing Evaluation Recommendations Recommendations: PO diet PO Diet Recommendation: Dysphagia 3 (Mechanical soft); Thin liquids (

## 2023-03-18 NOTE — Evaluation (Signed)
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   Fever and chills 04/01/2021   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   Complete heart block (HCC)    Heart block    Syncope 05/03/2020   History of stroke 05/03/2020   History of supraventricular tachycardia 05/03/2020   Thrombocytopenia (HCC) 05/03/2020   AKI (acute kidney injury) (HCC) 05/03/2020   Memory impairment 05/03/2020   Atypical chest pain 01/27/2020   SVT (supraventricular tachycardia) (HCC) 06/03/2017   Dizziness 08/02/2014   Cerebral infarction (HCC) 09/13/2012   Vertigo 09/12/2012   EAR PAIN 09/05/2009   BACK PAIN 06/20/2009   COLONIC POLYPS 05/06/2007   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   NONDEPENDENT ALCOHOL ABUSE IN REMISSION 05/06/2007   Essential hypertension 05/06/2007   Hypertensive heart disease without heart failure 05/06/2007   INTRACRANIAL HEMORRHAGE 05/06/2007   TIA (transient ischemic attack) 05/06/2007   HEMORRHOIDS 05/06/2007   INGUINAL HERNIA, RIGHT 05/06/2007   RENAL CYST 05/06/2007   HEMATURIA UNSPECIFIED 05/06/2007   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   PSA, INCREASED 05/06/2007   LIVER FUNCTION TESTS, ABNORMAL 05/06/2007   Benign prostatic hyperplasia with urinary obstruction 05/06/2007   Past Medical History:  Past Medical History:  Diagnosis Date   BPH (benign prostatic hyperplasia)    Cerebrovascular disease     Clostridium difficile diarrhea    Congenital anomaly of diaphragm    Elevated PSA    Glaucoma, both eyes    Hemorrhoid    Hepatitis B surface antigen positive    02-20-2011   History of adenomatous polyp of colon    2007, 2009 and 2013  tubular adenoma's   History of alcohol abuse    quit 1963   History of cerebral parenchymal hemorrhage    01/ 2006  left occiptial lobe related to hypertensive crisis   History of CVA (cerebrovascular accident)    09-12-2012  left hippocampus/ amygdala junction and per MRI old white matter infarcts--  per pt residual short- term memory issues   History of fatty infiltration of liver hx visit's at [facility] , last visit 05/ 2014   elvated LFT's ,  via liver bx 2004 related to hx alcohol and drug abuse (quit 1964)   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   HTN (hypertension)    Renal cyst, left    Stroke (HCC)    hx of 3 strokes in past    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    Urethral lesion    urethral mass   Past Surgical History:  Past Surgical History:  Procedure Laterality Date   CARDIOVASCULAR STRESS TEST  05/05/2007   normal nuclear study w/ no ischemia/  normal LV fucntion and wall motion , ef60%   COLONOSCOPY  last one 04-06-2012   CYSTO/  LEFT RETROGRADE PYELOGRAM/ CYTOLOGY WASHINGS/  URETEROSCOPY  03/05/2000   INGUINAL HERNIA REPAIR Bilateral 1965 and 1980's   IR 3D  INDEPENDENT WKST  07/31/2022   IR ANGIOGRAM PELVIS SELECTIVE OR SUPRASELECTIVE  07/31/2022   IR ANGIOGRAM SELECTIVE EACH ADDITIONAL VESSEL  07/31/2022   IR ANGIOGRAM SELECTIVE EACH ADDITIONAL VESSEL  07/31/2022   IR ANGIOGRAM SELECTIVE EACH ADDITIONAL VESSEL  07/31/2022   IR ANGIOGRAM SELECTIVE EACH ADDITIONAL VESSEL  07/31/2022   IR EMBO TUMOR ORGAN ISCHEMIA INFARCT INC GUIDE ROADMAPPING  07/31/2022   IR RADIOLOGIST EVAL & MGMT  06/26/2022   IR RADIOLOGIST EVAL & MGMT  08/08/2022   IR RADIOLOGIST EVAL & MGMT   08/21/2022   IR US GUIDE VASC ACCESS RIGHT  07/31/2022   LAPAROSCOPIC INGUINAL HERNIA WITH UMBILICAL HERNIA Right 06/24/2007   LIVER BIOPSY  1980's and 2004   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   Procedure: PACEMAKER IMPLANT;  Surgeon: Marinus Maw, MD;  Location: [facility];  Service: Cardiovascular;  Laterality: N/A;   SVT ABLATION N/A 11/15/2018   Procedure: SVT ABLATION;  Surgeon: Marinus Maw, MD;  Location: [facility];  Service: Cardiovascular;  Laterality: N/A;   TRANSTHORACIC ECHOCARDIOGRAM  09/13/2012   moderate LVH,  ef 60-65%/     TRANSURETHRAL RESECTION OF BLADDER TUMOR N/A 08/11/2016   Procedure: TRANSURETHRAL RESECTION OF BLADDER TUMOR (TURBT);  Surgeon: Malen Gauze, MD;  Location[facility];  Service: Urology;  Laterality: N/A;   HPI:  Dustin Hamilton is an 82 yo male presenting to ED 10/12 with AMS and acute onset of aphasia. UA with possible UTI. CXR with severe chronic elevation of R hemidiaphragm with increasing atelectasis. CTH negative. MRI Brain pending. MBS January 2024 with mild-moderate oropharyngeal dysphagia characterized by delayed swallow initiation, oropharyngeal residue resolved by cued multiple swallows, deep penetration of thin liquids. He was recommended to initiate a Dys 3 diet with nectar thick liquids at that time; however, pt's wife reports he does not like thickened liquids and is noncompliant with use. MRI Brain with acute infarcts in bilateral PCA territories (L>R) involving bilateral occipital lobes. Pt previously scored 19/30 on the SLUMS 09/04/21. PMH includes cerebral amyloid angiopathy, L hippocampus/amygdala infarct, history of alcohol and IV drug use, history of ICH, dementia, HTN, recurrent C. Difficile, BPH   Assessment / Plan / Recommendation Clinical Impression  [name] wife reports pt has a history of cognitive difficulty related to word-finding and memory at baseline. Pt reports he lives at home with his  wife, who provides assistance with ADLs. Today, he demonstrates deficits related to orientation, sustained attention, memory, auditory comprehension, and repetition at the sentence level. Pt states that he feels signficantly different that baseline, although wife states the she feels he is close to his baseline. Provided education regarding increased supervision with wife in agreement and stating that he has previously received HH ST services and would like to continue. Recommend SLP f/u to target deficits listed above upon d/c. SLP to s/o acutely.    SLP Assessment  SLP Recommendation/Assessment: All further Speech Lanaguage Pathology  needs can be addressed in the next venue of care SLP Visit Diagnosis: Cognitive communication deficit (R41.841)    Recommendations for follow up therapy are one component of a multi-disciplinary discharge planning process, led by the attending physician.  Recommendations may be updated based on patient status, additional functional criteria and insurance authorization.    Follow Up Recommendations  Skilled nursing-short term rehab (<3 hours/day)    Assistance Recommended at Discharge  Frequent or constant Supervision/Assistance  Functional Status Assessment Patient has had a recent decline in their  functional status and demonstrates the ability to make significant improvements in function in a reasonable and predictable amount of time.  Frequency and Duration           SLP Evaluation Cognition  Overall Cognitive Status: History of cognitive impairments - at baseline Arousal/Alertness: Awake/alert Orientation Level: Oriented to person;Disoriented to place;Disoriented to time;Disoriented to situation Attention: Sustained Sustained Attention: Impaired Sustained Attention Impairment: Verbal basic Memory: Impaired Memory Impairment: Storage deficit;Retrieval deficit Awareness: Impaired Awareness Impairment: Emergent impairment Problem Solving:  Impaired Problem Solving Impairment: Verbal basic       Comprehension  Auditory Comprehension Overall Auditory Comprehension: Impaired Yes/No Questions: Impaired Complex Questions: 25-49% accurate Paragraph Comprehension (via yes/no questions): 51-75% accurate    Expression Expression Primary Mode of Expression: Verbal Verbal Expression Overall Verbal Expression: Impaired Repetition: Impaired Level of Impairment: Sentence level Naming: Impairment Confrontation: Within functional limits Divergent: 0-24% accurate   Oral / Motor  Oral Motor/Sensory Function Overall Oral Motor/Sensory Function: Within functional limits Motor Speech Overall Motor Speech: Appears within functional limits for tasks assessed            Gwynneth Aliment, M.A., CF-SLP Speech Language Pathology, Acute Rehabilitation Services  Secure Chat preferred [phone]  03/18/2023, 4:00 PM

## 2023-03-18 NOTE — Progress Notes (Signed)
Patient nurse reporting blood persistently systolic upper 150s range and diastolic upper 100 range.  Heart rate 80, respiratory 16 and O2 sat 92% room air. -Patient had MRI yesterday 10/15 which showed Reveals acute infarctions in bilateral PCA territories left greater than right involving bilateral occipital lobes. There is acute infarction in the left hypothalamic region and innumerable supra and infratentorial microhemorrhages in lobar distribution in keeping with amyloid angiopathy history.  -Neurology already evaluated patient and recommended no need for permissive hypertension anymore and blood pressure can be normalized -Echocardiogram from 10/13 showing preserved EF 60 to 65%, normal left ventricular function.  There is mild left ventricular hypertrophy.  Left ventricular diastolic function could not be evaluated. - Currently on Toprol-XL 12.5 mg daily.  -Starting low-dose amlodipine 5 mg. - Starting hydralazine 5 mg every 8 as needed as needed for systolic blood pressure more than 160 or diastolic blood pressure more than 110.  Tereasa Coop, MD [facility] 03/18/2023, 3:48 AM

## 2023-03-18 NOTE — Progress Notes (Signed)
Physical Therapy Treatment Patient Details Name: Dustin Hamilton MRN: [id number] DOB: 08-10-1940 Today's Date: 03/18/2023   History of Present Illness The pt is an 82 yo male presenting 10/12 with aphasia and AMS. CT with no acute abnormalities. Pt with significant fluctuations in BP, and with BG to 70 on admission, UA also with possible UTI. 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 PMH includes: dementia, glaucoma, L occipital lobe parenchymal hemorrhage, L hippocampus/amygdala infarct, hx of alcohol and IV drug use, HTN, pacemaker, recurrent C. Difficile, BPH.    PT Comments  Patient is agreeable to PT session. He was seated in chair on arrival to room with no family present. He has some confusion but is cooperative throughout session. 3 standing bouts performed. Max A for initial stand, progressing to Mod A with additional standing bouts. Patient ambulated as short distance in the room with rolling walker and steadying assistance required. Unable to progress ambulation further due to fatigue and worsening posture and gait pattern. The patient continues to require physical assistance with mobility and will need assistance from caregivers if going home. Recommend rehabilitation after this hospital stay < 3 hours/day.    If plan is discharge home, recommend the following: Two people to help with walking and/or transfers;Two people to help with bathing/dressing/bathroom;Assistance with cooking/housework;Direct supervision/assist for medications management;Direct supervision/assist for financial management;Assist for transportation;Help with stairs or ramp for entrance;Supervision due to cognitive status   Can travel by private vehicle     No  Equipment Recommendations  Wheelchair (measurements PT);Wheelchair cushion (measurements PT)    Recommendations for Other Services       Precautions / Restrictions  Precautions Precautions: Fall Precaution Comments: low vision Restrictions Weight Bearing Restrictions: No     Mobility  Bed Mobility               General bed mobility comments: not observed as patient sitting up on arrival and post session    Transfers Overall transfer level: Needs assistance Equipment used: Rolling walker (2 wheels) Transfers: Sit to/from Stand Sit to Stand: Max assist, Mod assist           General transfer comment: 3 standing bouts performed with rest breaks between standing bouts . cues for hand placement, foot placement, anterior weight shifting. Max A with initial stand, progressing to Mod A with additional standing bouts    Ambulation/Gait Ambulation/Gait assistance: Mod assist Gait Distance (Feet): 3 Feet Assistive device: Rolling walker (2 wheels) Gait Pattern/deviations: Step-to pattern, Trunk flexed, Knee flexed in stance - right, Knee flexed in stance - left Gait velocity: decreased     General Gait Details: increased knee flexion and trunk flexion with increased ambulation distance and fatigue. unable to safely progress ambulation further at this time. chair follow for safety. hand over hand cues required for placing hands on rolling walker for support secondary to low vision   Stairs             Wheelchair Mobility     Tilt Bed    Modified Rankin (Stroke Patients Only)       Balance Overall balance assessment: Needs assistance Sitting-balance support: Feet supported, Bilateral upper extremity supported Sitting balance-Leahy Scale: Fair     Standing balance support: Bilateral upper extremity supported, During functional activity Standing balance-Leahy Scale: Poor Standing balance comment: external support required  Cognition Arousal: Alert Behavior During Therapy: Flat affect Overall Cognitive Status: Impaired/Different from baseline Area of Impairment: Orientation,  Attention, Memory, Following commands, Safety/judgement, Awareness, Problem solving                 Orientation Level: Disoriented to, Situation, Time Current Attention Level: Focused Memory: Decreased recall of precautions, Decreased short-term memory Following Commands: Follows one step commands inconsistently, Follows one step commands with increased time Safety/Judgement: Decreased awareness of safety, Decreased awareness of deficits   Problem Solving: Slow processing, Requires tactile cues, Requires verbal cues          Exercises      General Comments        Pertinent Vitals/Pain Pain Assessment Pain Assessment: No/denies pain    Home Living                          Prior Function            PT Goals (current goals can now be found in the care plan section) Acute Rehab PT Goals Patient Stated Goal: none stated by pt, wife hopeful he can return home PT Goal Formulation: With patient/family Time For Goal Achievement: 03/29/23 Potential to Achieve Goals: Good Progress towards PT goals: Progressing toward goals    Frequency    Min 1X/week      PT Plan      Co-evaluation              AM-PAC PT "6 Clicks" Mobility   Outcome Measure  Help needed turning from your back to your side while in a flat bed without using bedrails?: A Lot Help needed moving from lying on your back to sitting on the side of a flat bed without using bedrails?: Total Help needed moving to and from a bed to a chair (including a wheelchair)?: Total Help needed standing up from a chair using your arms (e.g., wheelchair or bedside chair)?: Total Help needed to walk in hospital room?: Total Help needed climbing 3-5 steps with a railing? : Total 6 Click Score: 7    End of Session Equipment Utilized During Treatment: Gait belt Activity Tolerance: Patient limited by fatigue Patient left: in chair;with call bell/phone within reach;with chair alarm set   PT Visit  Diagnosis: Unsteadiness on feet (R26.81);Other abnormalities of gait and mobility (R26.89);Muscle weakness (generalized) (M62.81)     Time: 1610-9604 PT Time Calculation (min) (ACUTE ONLY): 22 min  Charges:    $Therapeutic Activity: 8-22 mins PT General Charges $$ ACUTE PT VISIT: 1 Visit                     Donna Bernard, PT, MPT    Ina Homes 03/18/2023, 12:01 PM

## 2023-03-19 DIAGNOSIS — R4182 Altered mental status, unspecified: Secondary | ICD-10-CM | POA: Diagnosis not present

## 2023-03-19 LAB — MAGNESIUM: Magnesium: 1.9 mg/dL (ref 1.7–2.4)

## 2023-03-19 LAB — BASIC METABOLIC PANEL
Anion gap: 12 (ref 5–15)
BUN: 14 mg/dL (ref 8–23)
CO2: 25 mmol/L (ref 22–32)
Calcium: 10.3 mg/dL (ref 8.9–10.3)
Chloride: 104 mmol/L (ref 98–111)
Creatinine, Ser: 0.91 mg/dL (ref 0.61–1.24)
GFR, Estimated: >60 mL/min (ref >60)
Glucose, Bld: 111 mg/dL — ABNORMAL HIGH (ref 70–99)
Potassium: 3.7 mmol/L (ref 3.5–5.1)
Sodium: 141 mmol/L (ref 135–145)

## 2023-03-19 LAB — CBC WITH DIFFERENTIAL/PLATELET
Abs Immature Granulocytes: 0.02 K/uL (ref 0.00–0.07)
Basophils Absolute: 0 K/uL (ref 0.0–0.1)
Basophils Relative: 0 %
Eosinophils Absolute: 0.1 K/uL (ref 0.0–0.5)
Eosinophils Relative: 2 %
HCT: 44.2 % (ref 39.0–52.0)
Hemoglobin: 15.3 g/dL (ref 13.0–17.0)
Immature Granulocytes: 0 %
Lymphocytes Relative: 22 %
Lymphs Abs: 1.1 K/uL (ref 0.7–4.0)
MCH: 31.4 pg (ref 26.0–34.0)
MCHC: 34.6 g/dL (ref 30.0–36.0)
MCV: 90.6 fL (ref 80.0–100.0)
Monocytes Absolute: 0.4 K/uL (ref 0.1–1.0)
Monocytes Relative: 7 %
Neutro Abs: 3.4 K/uL (ref 1.7–7.7)
Neutrophils Relative %: 69 %
Platelets: 122 K/uL — ABNORMAL LOW (ref 150–400)
RBC: 4.88 MIL/uL (ref 4.22–5.81)
RDW: 14.2 % (ref 11.5–15.5)
WBC: 5 K/uL (ref 4.0–10.5)
nRBC: 0 % (ref 0.0–0.2)

## 2023-03-19 LAB — BRAIN NATRIURETIC PEPTIDE: B Natriuretic Peptide: 274.3 pg/mL — ABNORMAL HIGH (ref 0.0–100.0)

## 2023-03-19 LAB — GLUCOSE, CAPILLARY
Glucose-Capillary: 112 mg/dL — ABNORMAL HIGH (ref 70–99)
Glucose-Capillary: 116 mg/dL — ABNORMAL HIGH (ref 70–99)

## 2023-03-19 LAB — PHOSPHORUS: Phosphorus: 2.3 mg/dL — ABNORMAL LOW (ref 2.5–4.6)

## 2023-03-19 MED ORDER — METOPROLOL SUCCINATE ER 25 MG PO TB24
25.0000 mg | ORAL_TABLET | Freq: Every day | ORAL | Status: DC
  Administered 2023-03-19 – 2023-03-21 (×3): 25 mg via ORAL
  Filled 2023-03-19 (×3): qty 1

## 2023-03-19 MED ORDER — AMLODIPINE BESYLATE 10 MG PO TABS
10.0000 mg | ORAL_TABLET | Freq: Every day | ORAL | Status: DC
  Administered 2023-03-19 – 2023-03-21 (×3): 10 mg via ORAL
  Filled 2023-03-19 (×3): qty 1

## 2023-03-19 NOTE — Progress Notes (Signed)
PROGRESS NOTE                                                                                                                                                                                                             Patient Demographics:    Dustin Hamilton, is a 82 y.o. male, DOB - April 16, 1941, ZOX:[id number]  Outpatient Primary MD [facility] the patient is [facility]    LOS - 3  Admit date - 03/14/2023    Chief Complaint  Patient presents with   Altered Mental Status       Brief Narrative (HPI from H&P)     82 y.o. 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  He was completely unresponsive on arrival.  His wife told ED physician that she went to take a shower around 2:15-2:30 PM and at that time patient was at his baseline.  When she checked on the patient at 4:50 PM, he was found unresponsive/collapsed at the dining room table.  In the ER head CT nonacute, EEG unremarkable, MRI ordered seen by neurology and admitted to the hospital.   Subjective:   Patient denies any complaints today, no significant events overnight   Assessment  & Plan :    Altered mental status/unresponsiveness, metabolic encephalopathy   - Due to combination of UTI, dehydration, hypotension and mild hypoglycemia. -As well secondary to acute CVA.  Acute CVA -MRI brain showing acute infarcts in bilateral PCA territory, left greater than right, involving bilateral occipital lobes, and acute infarct in the left hypothalamic region. -Continue with aspirin for now, given his high risk for bleed secondary to  brain amyloidosis, with history of ICH in the past -blood pressure control, targeting systolic blood pressure 140. -PT/OT is following.  Recommendation for SNF placement.   Hypertension -On Toprol-XL 12.5 mg at home, started on Norvasc as well, blood pressure remains elevated, recommendation for SBP < 140, so Toprol-XL increased to 25 mg, and Norvasc increased to 10 mg.   Borderline hypoglycemia - Glucose now improved after oral intake.  Patient is not on any diabetes medications, last  system: No evidence of dissection, occlusion, or hemodynamically significant stenosis (greater than 50%). Vertebral arteries: No evidence of dissection, occlusion, or hemodynamically significant stenosis (greater than 50%). Skeleton: No acute osseous abnormality. Degenerative changes in the cervical spine. Other neck: Redemonstrated diffusely enlarged thyroid, which appears similar to the 05/01/2022 chest CT. Upper chest: No focal pulmonary opacity or pleural effusion. Review of the MIP images confirms the above findings CTA HEAD FINDINGS Anterior circulation: Both internal carotid arteries are patent to the termini, without significant stenosis. A1 segments patent. Normal anterior communicating artery. Anterior cerebral arteries are patent to their distal aspects without significant stenosis. No M1 stenosis or occlusion. MCA branches perfused to their distal aspects without significant stenosis. Posterior circulation: Vertebral arteries patent to the vertebrobasilar junction without significant stenosis. Posterior inferior cerebellar arteries patent proximally. Basilar patent to its distal aspect without significant stenosis. Superior cerebellar arteries patent proximally. Patent P1 segments. Occlusion of the proximal left P2 (series 11, images 106-111, with reconstitution in mid left P2; this is new compared to 2022. PCAs are otherwise perfused to their distal aspects without significant stenosis. The bilateral posterior communicating arteries are patent. Venous sinuses: As permitted by contrast timing, patent. Anatomic variants: None significant. No evidence of aneurysm or vascular malformation. Review of the MIP images confirms the above findings IMPRESSION: 1. Occlusion of the proximal left P2, with reconstitution in mid left P2, which is new from the 2022 CTA. 2. No other intracranial large vessel occlusion or significant stenosis. 3. No  hemodynamically significant stenosis in the neck. 4. Loss of gray-white differentiation in the left occipital lobe, which correlates with the acute infarct seen on the 03/17/2023 MRI. Insert peer E: Port Electronically Signed   By: Wiliam Ke M.D.   On: 03/17/2023 23:47   DG Swallowing Func-Speech Pathology  Result Date: 03/17/2023 Table formatting from the original result was not included. Modified Barium Swallow Study Patient Details Name: Dustin Hamilton MRN: [id number] Date of Birth: 03-Jun-1940 Today's Date: 03/17/2023 HPI/PMH: HPI: Dustin Hamilton is an 82 yo male presenting to ED 10/12 with AMS and acute onset of aphasia. UA with possible UTI. CXR with severe chronic elevation of R hemidiaphragm with increasing atelectasis. CTH negative. MRI Brain pending. MBS January 2024 with mild-moderate oropharyngeal dysphagia characterized by delayed swallow initiation, oropharyngeal residue resolved by cued multiple swallows, deep penetration of thin liquids. He was recommended to initiate a Dys 3 diet with nectar thick liquids at that time; however, pt's wife reports he does not like thickened liquids and is noncompliant with use. PMH includes cerebral amyloid angiopathy, L hippocampus/amygdala infarct, history of alcohol and IV drug use, history of ICH, dementia, HTN, recurrent C. Difficile, BPH Clinical Impression: Clinical Impression: Pt presents with a seemingly improved oropharyngeal swallow compared to previous MBS 06/11/22. He achieves good oral clearance and adequate BOT retraction with all consistencies administered. With solid consistencies, pt did have slow mastication. He continues to have trace and transient penetration with thin liquids (PAS 2), which does not appear to be as deep as noted previously. Nectar thick liquids via Hamilton resulted in trace penetration that was unable to be cleared (PAS 3). Suspect pt's distaste for barium contributed to performance with thickened liquid trials. Pt  achieves good pharyngeal clearance as well, with only trace amounts of residue present in the valleculae and pyriform sinuses and was cleared with the use of multiple swallows. Recommend continuing current diet of Dys 3 texture solids with thin liquids. Provided education to pt and his  system: No evidence of dissection, occlusion, or hemodynamically significant stenosis (greater than 50%). Vertebral arteries: No evidence of dissection, occlusion, or hemodynamically significant stenosis (greater than 50%). Skeleton: No acute osseous abnormality. Degenerative changes in the cervical spine. Other neck: Redemonstrated diffusely enlarged thyroid, which appears similar to the 05/01/2022 chest CT. Upper chest: No focal pulmonary opacity or pleural effusion. Review of the MIP images confirms the above findings CTA HEAD FINDINGS Anterior circulation: Both internal carotid arteries are patent to the termini, without significant stenosis. A1 segments patent. Normal anterior communicating artery. Anterior cerebral arteries are patent to their distal aspects without significant stenosis. No M1 stenosis or occlusion. MCA branches perfused to their distal aspects without significant stenosis. Posterior circulation: Vertebral arteries patent to the vertebrobasilar junction without significant stenosis. Posterior inferior cerebellar arteries patent proximally. Basilar patent to its distal aspect without significant stenosis. Superior cerebellar arteries patent proximally. Patent P1 segments. Occlusion of the proximal left P2 (series 11, images 106-111, with reconstitution in mid left P2; this is new compared to 2022. PCAs are otherwise perfused to their distal aspects without significant stenosis. The bilateral posterior communicating arteries are patent. Venous sinuses: As permitted by contrast timing, patent. Anatomic variants: None significant. No evidence of aneurysm or vascular malformation. Review of the MIP images confirms the above findings IMPRESSION: 1. Occlusion of the proximal left P2, with reconstitution in mid left P2, which is new from the 2022 CTA. 2. No other intracranial large vessel occlusion or significant stenosis. 3. No  hemodynamically significant stenosis in the neck. 4. Loss of gray-white differentiation in the left occipital lobe, which correlates with the acute infarct seen on the 03/17/2023 MRI. Insert peer E: Port Electronically Signed   By: Wiliam Ke M.D.   On: 03/17/2023 23:47   DG Swallowing Func-Speech Pathology  Result Date: 03/17/2023 Table formatting from the original result was not included. Modified Barium Swallow Study Patient Details Name: Dustin Hamilton MRN: [id number] Date of Birth: 03-Jun-1940 Today's Date: 03/17/2023 HPI/PMH: HPI: Dustin Hamilton is an 82 yo male presenting to ED 10/12 with AMS and acute onset of aphasia. UA with possible UTI. CXR with severe chronic elevation of R hemidiaphragm with increasing atelectasis. CTH negative. MRI Brain pending. MBS January 2024 with mild-moderate oropharyngeal dysphagia characterized by delayed swallow initiation, oropharyngeal residue resolved by cued multiple swallows, deep penetration of thin liquids. He was recommended to initiate a Dys 3 diet with nectar thick liquids at that time; however, pt's wife reports he does not like thickened liquids and is noncompliant with use. PMH includes cerebral amyloid angiopathy, L hippocampus/amygdala infarct, history of alcohol and IV drug use, history of ICH, dementia, HTN, recurrent C. Difficile, BPH Clinical Impression: Clinical Impression: Pt presents with a seemingly improved oropharyngeal swallow compared to previous MBS 06/11/22. He achieves good oral clearance and adequate BOT retraction with all consistencies administered. With solid consistencies, pt did have slow mastication. He continues to have trace and transient penetration with thin liquids (PAS 2), which does not appear to be as deep as noted previously. Nectar thick liquids via Hamilton resulted in trace penetration that was unable to be cleared (PAS 3). Suspect pt's distaste for barium contributed to performance with thickened liquid trials. Pt  achieves good pharyngeal clearance as well, with only trace amounts of residue present in the valleculae and pyriform sinuses and was cleared with the use of multiple swallows. Recommend continuing current diet of Dys 3 texture solids with thin liquids. Provided education to pt and his  system: No evidence of dissection, occlusion, or hemodynamically significant stenosis (greater than 50%). Vertebral arteries: No evidence of dissection, occlusion, or hemodynamically significant stenosis (greater than 50%). Skeleton: No acute osseous abnormality. Degenerative changes in the cervical spine. Other neck: Redemonstrated diffusely enlarged thyroid, which appears similar to the 05/01/2022 chest CT. Upper chest: No focal pulmonary opacity or pleural effusion. Review of the MIP images confirms the above findings CTA HEAD FINDINGS Anterior circulation: Both internal carotid arteries are patent to the termini, without significant stenosis. A1 segments patent. Normal anterior communicating artery. Anterior cerebral arteries are patent to their distal aspects without significant stenosis. No M1 stenosis or occlusion. MCA branches perfused to their distal aspects without significant stenosis. Posterior circulation: Vertebral arteries patent to the vertebrobasilar junction without significant stenosis. Posterior inferior cerebellar arteries patent proximally. Basilar patent to its distal aspect without significant stenosis. Superior cerebellar arteries patent proximally. Patent P1 segments. Occlusion of the proximal left P2 (series 11, images 106-111, with reconstitution in mid left P2; this is new compared to 2022. PCAs are otherwise perfused to their distal aspects without significant stenosis. The bilateral posterior communicating arteries are patent. Venous sinuses: As permitted by contrast timing, patent. Anatomic variants: None significant. No evidence of aneurysm or vascular malformation. Review of the MIP images confirms the above findings IMPRESSION: 1. Occlusion of the proximal left P2, with reconstitution in mid left P2, which is new from the 2022 CTA. 2. No other intracranial large vessel occlusion or significant stenosis. 3. No  hemodynamically significant stenosis in the neck. 4. Loss of gray-white differentiation in the left occipital lobe, which correlates with the acute infarct seen on the 03/17/2023 MRI. Insert peer E: Port Electronically Signed   By: Wiliam Ke M.D.   On: 03/17/2023 23:47   DG Swallowing Func-Speech Pathology  Result Date: 03/17/2023 Table formatting from the original result was not included. Modified Barium Swallow Study Patient Details Name: Dustin Hamilton MRN: [id number] Date of Birth: 03-Jun-1940 Today's Date: 03/17/2023 HPI/PMH: HPI: Dustin Hamilton is an 82 yo male presenting to ED 10/12 with AMS and acute onset of aphasia. UA with possible UTI. CXR with severe chronic elevation of R hemidiaphragm with increasing atelectasis. CTH negative. MRI Brain pending. MBS January 2024 with mild-moderate oropharyngeal dysphagia characterized by delayed swallow initiation, oropharyngeal residue resolved by cued multiple swallows, deep penetration of thin liquids. He was recommended to initiate a Dys 3 diet with nectar thick liquids at that time; however, pt's wife reports he does not like thickened liquids and is noncompliant with use. PMH includes cerebral amyloid angiopathy, L hippocampus/amygdala infarct, history of alcohol and IV drug use, history of ICH, dementia, HTN, recurrent C. Difficile, BPH Clinical Impression: Clinical Impression: Pt presents with a seemingly improved oropharyngeal swallow compared to previous MBS 06/11/22. He achieves good oral clearance and adequate BOT retraction with all consistencies administered. With solid consistencies, pt did have slow mastication. He continues to have trace and transient penetration with thin liquids (PAS 2), which does not appear to be as deep as noted previously. Nectar thick liquids via Hamilton resulted in trace penetration that was unable to be cleared (PAS 3). Suspect pt's distaste for barium contributed to performance with thickened liquid trials. Pt  achieves good pharyngeal clearance as well, with only trace amounts of residue present in the valleculae and pyriform sinuses and was cleared with the use of multiple swallows. Recommend continuing current diet of Dys 3 texture solids with thin liquids. Provided education to pt and his  system: No evidence of dissection, occlusion, or hemodynamically significant stenosis (greater than 50%). Vertebral arteries: No evidence of dissection, occlusion, or hemodynamically significant stenosis (greater than 50%). Skeleton: No acute osseous abnormality. Degenerative changes in the cervical spine. Other neck: Redemonstrated diffusely enlarged thyroid, which appears similar to the 05/01/2022 chest CT. Upper chest: No focal pulmonary opacity or pleural effusion. Review of the MIP images confirms the above findings CTA HEAD FINDINGS Anterior circulation: Both internal carotid arteries are patent to the termini, without significant stenosis. A1 segments patent. Normal anterior communicating artery. Anterior cerebral arteries are patent to their distal aspects without significant stenosis. No M1 stenosis or occlusion. MCA branches perfused to their distal aspects without significant stenosis. Posterior circulation: Vertebral arteries patent to the vertebrobasilar junction without significant stenosis. Posterior inferior cerebellar arteries patent proximally. Basilar patent to its distal aspect without significant stenosis. Superior cerebellar arteries patent proximally. Patent P1 segments. Occlusion of the proximal left P2 (series 11, images 106-111, with reconstitution in mid left P2; this is new compared to 2022. PCAs are otherwise perfused to their distal aspects without significant stenosis. The bilateral posterior communicating arteries are patent. Venous sinuses: As permitted by contrast timing, patent. Anatomic variants: None significant. No evidence of aneurysm or vascular malformation. Review of the MIP images confirms the above findings IMPRESSION: 1. Occlusion of the proximal left P2, with reconstitution in mid left P2, which is new from the 2022 CTA. 2. No other intracranial large vessel occlusion or significant stenosis. 3. No  hemodynamically significant stenosis in the neck. 4. Loss of gray-white differentiation in the left occipital lobe, which correlates with the acute infarct seen on the 03/17/2023 MRI. Insert peer E: Port Electronically Signed   By: Wiliam Ke M.D.   On: 03/17/2023 23:47   DG Swallowing Func-Speech Pathology  Result Date: 03/17/2023 Table formatting from the original result was not included. Modified Barium Swallow Study Patient Details Name: Dustin Hamilton MRN: [id number] Date of Birth: 03-Jun-1940 Today's Date: 03/17/2023 HPI/PMH: HPI: Dustin Hamilton is an 82 yo male presenting to ED 10/12 with AMS and acute onset of aphasia. UA with possible UTI. CXR with severe chronic elevation of R hemidiaphragm with increasing atelectasis. CTH negative. MRI Brain pending. MBS January 2024 with mild-moderate oropharyngeal dysphagia characterized by delayed swallow initiation, oropharyngeal residue resolved by cued multiple swallows, deep penetration of thin liquids. He was recommended to initiate a Dys 3 diet with nectar thick liquids at that time; however, pt's wife reports he does not like thickened liquids and is noncompliant with use. PMH includes cerebral amyloid angiopathy, L hippocampus/amygdala infarct, history of alcohol and IV drug use, history of ICH, dementia, HTN, recurrent C. Difficile, BPH Clinical Impression: Clinical Impression: Pt presents with a seemingly improved oropharyngeal swallow compared to previous MBS 06/11/22. He achieves good oral clearance and adequate BOT retraction with all consistencies administered. With solid consistencies, pt did have slow mastication. He continues to have trace and transient penetration with thin liquids (PAS 2), which does not appear to be as deep as noted previously. Nectar thick liquids via Hamilton resulted in trace penetration that was unable to be cleared (PAS 3). Suspect pt's distaste for barium contributed to performance with thickened liquid trials. Pt  achieves good pharyngeal clearance as well, with only trace amounts of residue present in the valleculae and pyriform sinuses and was cleared with the use of multiple swallows. Recommend continuing current diet of Dys 3 texture solids with thin liquids. Provided education to pt and his  system: No evidence of dissection, occlusion, or hemodynamically significant stenosis (greater than 50%). Vertebral arteries: No evidence of dissection, occlusion, or hemodynamically significant stenosis (greater than 50%). Skeleton: No acute osseous abnormality. Degenerative changes in the cervical spine. Other neck: Redemonstrated diffusely enlarged thyroid, which appears similar to the 05/01/2022 chest CT. Upper chest: No focal pulmonary opacity or pleural effusion. Review of the MIP images confirms the above findings CTA HEAD FINDINGS Anterior circulation: Both internal carotid arteries are patent to the termini, without significant stenosis. A1 segments patent. Normal anterior communicating artery. Anterior cerebral arteries are patent to their distal aspects without significant stenosis. No M1 stenosis or occlusion. MCA branches perfused to their distal aspects without significant stenosis. Posterior circulation: Vertebral arteries patent to the vertebrobasilar junction without significant stenosis. Posterior inferior cerebellar arteries patent proximally. Basilar patent to its distal aspect without significant stenosis. Superior cerebellar arteries patent proximally. Patent P1 segments. Occlusion of the proximal left P2 (series 11, images 106-111, with reconstitution in mid left P2; this is new compared to 2022. PCAs are otherwise perfused to their distal aspects without significant stenosis. The bilateral posterior communicating arteries are patent. Venous sinuses: As permitted by contrast timing, patent. Anatomic variants: None significant. No evidence of aneurysm or vascular malformation. Review of the MIP images confirms the above findings IMPRESSION: 1. Occlusion of the proximal left P2, with reconstitution in mid left P2, which is new from the 2022 CTA. 2. No other intracranial large vessel occlusion or significant stenosis. 3. No  hemodynamically significant stenosis in the neck. 4. Loss of gray-white differentiation in the left occipital lobe, which correlates with the acute infarct seen on the 03/17/2023 MRI. Insert peer E: Port Electronically Signed   By: Wiliam Ke M.D.   On: 03/17/2023 23:47   DG Swallowing Func-Speech Pathology  Result Date: 03/17/2023 Table formatting from the original result was not included. Modified Barium Swallow Study Patient Details Name: Dustin Hamilton MRN: [id number] Date of Birth: 03-Jun-1940 Today's Date: 03/17/2023 HPI/PMH: HPI: Dustin Hamilton is an 82 yo male presenting to ED 10/12 with AMS and acute onset of aphasia. UA with possible UTI. CXR with severe chronic elevation of R hemidiaphragm with increasing atelectasis. CTH negative. MRI Brain pending. MBS January 2024 with mild-moderate oropharyngeal dysphagia characterized by delayed swallow initiation, oropharyngeal residue resolved by cued multiple swallows, deep penetration of thin liquids. He was recommended to initiate a Dys 3 diet with nectar thick liquids at that time; however, pt's wife reports he does not like thickened liquids and is noncompliant with use. PMH includes cerebral amyloid angiopathy, L hippocampus/amygdala infarct, history of alcohol and IV drug use, history of ICH, dementia, HTN, recurrent C. Difficile, BPH Clinical Impression: Clinical Impression: Pt presents with a seemingly improved oropharyngeal swallow compared to previous MBS 06/11/22. He achieves good oral clearance and adequate BOT retraction with all consistencies administered. With solid consistencies, pt did have slow mastication. He continues to have trace and transient penetration with thin liquids (PAS 2), which does not appear to be as deep as noted previously. Nectar thick liquids via Hamilton resulted in trace penetration that was unable to be cleared (PAS 3). Suspect pt's distaste for barium contributed to performance with thickened liquid trials. Pt  achieves good pharyngeal clearance as well, with only trace amounts of residue present in the valleculae and pyriform sinuses and was cleared with the use of multiple swallows. Recommend continuing current diet of Dys 3 texture solids with thin liquids. Provided education to pt and his  system: No evidence of dissection, occlusion, or hemodynamically significant stenosis (greater than 50%). Vertebral arteries: No evidence of dissection, occlusion, or hemodynamically significant stenosis (greater than 50%). Skeleton: No acute osseous abnormality. Degenerative changes in the cervical spine. Other neck: Redemonstrated diffusely enlarged thyroid, which appears similar to the 05/01/2022 chest CT. Upper chest: No focal pulmonary opacity or pleural effusion. Review of the MIP images confirms the above findings CTA HEAD FINDINGS Anterior circulation: Both internal carotid arteries are patent to the termini, without significant stenosis. A1 segments patent. Normal anterior communicating artery. Anterior cerebral arteries are patent to their distal aspects without significant stenosis. No M1 stenosis or occlusion. MCA branches perfused to their distal aspects without significant stenosis. Posterior circulation: Vertebral arteries patent to the vertebrobasilar junction without significant stenosis. Posterior inferior cerebellar arteries patent proximally. Basilar patent to its distal aspect without significant stenosis. Superior cerebellar arteries patent proximally. Patent P1 segments. Occlusion of the proximal left P2 (series 11, images 106-111, with reconstitution in mid left P2; this is new compared to 2022. PCAs are otherwise perfused to their distal aspects without significant stenosis. The bilateral posterior communicating arteries are patent. Venous sinuses: As permitted by contrast timing, patent. Anatomic variants: None significant. No evidence of aneurysm or vascular malformation. Review of the MIP images confirms the above findings IMPRESSION: 1. Occlusion of the proximal left P2, with reconstitution in mid left P2, which is new from the 2022 CTA. 2. No other intracranial large vessel occlusion or significant stenosis. 3. No  hemodynamically significant stenosis in the neck. 4. Loss of gray-white differentiation in the left occipital lobe, which correlates with the acute infarct seen on the 03/17/2023 MRI. Insert peer E: Port Electronically Signed   By: Wiliam Ke M.D.   On: 03/17/2023 23:47   DG Swallowing Func-Speech Pathology  Result Date: 03/17/2023 Table formatting from the original result was not included. Modified Barium Swallow Study Patient Details Name: Dustin Hamilton MRN: [id number] Date of Birth: 03-Jun-1940 Today's Date: 03/17/2023 HPI/PMH: HPI: Dustin Hamilton is an 82 yo male presenting to ED 10/12 with AMS and acute onset of aphasia. UA with possible UTI. CXR with severe chronic elevation of R hemidiaphragm with increasing atelectasis. CTH negative. MRI Brain pending. MBS January 2024 with mild-moderate oropharyngeal dysphagia characterized by delayed swallow initiation, oropharyngeal residue resolved by cued multiple swallows, deep penetration of thin liquids. He was recommended to initiate a Dys 3 diet with nectar thick liquids at that time; however, pt's wife reports he does not like thickened liquids and is noncompliant with use. PMH includes cerebral amyloid angiopathy, L hippocampus/amygdala infarct, history of alcohol and IV drug use, history of ICH, dementia, HTN, recurrent C. Difficile, BPH Clinical Impression: Clinical Impression: Pt presents with a seemingly improved oropharyngeal swallow compared to previous MBS 06/11/22. He achieves good oral clearance and adequate BOT retraction with all consistencies administered. With solid consistencies, pt did have slow mastication. He continues to have trace and transient penetration with thin liquids (PAS 2), which does not appear to be as deep as noted previously. Nectar thick liquids via Hamilton resulted in trace penetration that was unable to be cleared (PAS 3). Suspect pt's distaste for barium contributed to performance with thickened liquid trials. Pt  achieves good pharyngeal clearance as well, with only trace amounts of residue present in the valleculae and pyriform sinuses and was cleared with the use of multiple swallows. Recommend continuing current diet of Dys 3 texture solids with thin liquids. Provided education to pt and his  system: No evidence of dissection, occlusion, or hemodynamically significant stenosis (greater than 50%). Vertebral arteries: No evidence of dissection, occlusion, or hemodynamically significant stenosis (greater than 50%). Skeleton: No acute osseous abnormality. Degenerative changes in the cervical spine. Other neck: Redemonstrated diffusely enlarged thyroid, which appears similar to the 05/01/2022 chest CT. Upper chest: No focal pulmonary opacity or pleural effusion. Review of the MIP images confirms the above findings CTA HEAD FINDINGS Anterior circulation: Both internal carotid arteries are patent to the termini, without significant stenosis. A1 segments patent. Normal anterior communicating artery. Anterior cerebral arteries are patent to their distal aspects without significant stenosis. No M1 stenosis or occlusion. MCA branches perfused to their distal aspects without significant stenosis. Posterior circulation: Vertebral arteries patent to the vertebrobasilar junction without significant stenosis. Posterior inferior cerebellar arteries patent proximally. Basilar patent to its distal aspect without significant stenosis. Superior cerebellar arteries patent proximally. Patent P1 segments. Occlusion of the proximal left P2 (series 11, images 106-111, with reconstitution in mid left P2; this is new compared to 2022. PCAs are otherwise perfused to their distal aspects without significant stenosis. The bilateral posterior communicating arteries are patent. Venous sinuses: As permitted by contrast timing, patent. Anatomic variants: None significant. No evidence of aneurysm or vascular malformation. Review of the MIP images confirms the above findings IMPRESSION: 1. Occlusion of the proximal left P2, with reconstitution in mid left P2, which is new from the 2022 CTA. 2. No other intracranial large vessel occlusion or significant stenosis. 3. No  hemodynamically significant stenosis in the neck. 4. Loss of gray-white differentiation in the left occipital lobe, which correlates with the acute infarct seen on the 03/17/2023 MRI. Insert peer E: Port Electronically Signed   By: Wiliam Ke M.D.   On: 03/17/2023 23:47   DG Swallowing Func-Speech Pathology  Result Date: 03/17/2023 Table formatting from the original result was not included. Modified Barium Swallow Study Patient Details Name: Dustin Hamilton MRN: [id number] Date of Birth: 03-Jun-1940 Today's Date: 03/17/2023 HPI/PMH: HPI: Dustin Hamilton is an 82 yo male presenting to ED 10/12 with AMS and acute onset of aphasia. UA with possible UTI. CXR with severe chronic elevation of R hemidiaphragm with increasing atelectasis. CTH negative. MRI Brain pending. MBS January 2024 with mild-moderate oropharyngeal dysphagia characterized by delayed swallow initiation, oropharyngeal residue resolved by cued multiple swallows, deep penetration of thin liquids. He was recommended to initiate a Dys 3 diet with nectar thick liquids at that time; however, pt's wife reports he does not like thickened liquids and is noncompliant with use. PMH includes cerebral amyloid angiopathy, L hippocampus/amygdala infarct, history of alcohol and IV drug use, history of ICH, dementia, HTN, recurrent C. Difficile, BPH Clinical Impression: Clinical Impression: Pt presents with a seemingly improved oropharyngeal swallow compared to previous MBS 06/11/22. He achieves good oral clearance and adequate BOT retraction with all consistencies administered. With solid consistencies, pt did have slow mastication. He continues to have trace and transient penetration with thin liquids (PAS 2), which does not appear to be as deep as noted previously. Nectar thick liquids via Hamilton resulted in trace penetration that was unable to be cleared (PAS 3). Suspect pt's distaste for barium contributed to performance with thickened liquid trials. Pt  achieves good pharyngeal clearance as well, with only trace amounts of residue present in the valleculae and pyriform sinuses and was cleared with the use of multiple swallows. Recommend continuing current diet of Dys 3 texture solids with thin liquids. Provided education to pt and his

## 2023-03-19 NOTE — TOC Initial Note (Addendum)
Transition of Care [facility]) - Initial/Assessment Note    Patient Details  Name: Dustin Hamilton MRN: [id number] Date of Birth: May 08, 1941  Transition of Care [facility]) CM/SW Contact:    Mearl Latin, LCSW Phone Number: 03/19/2023, 9:17 AM  Clinical Narrative:                 9:17am-CSW spoke with patient's spouse to confirm that she still wanted Home Health and not SNF. She confirmed that she does not want SNF and wants home health services as patient had before (she confirmed plan with a male family member in the background). They request [facility] for transport home. Will request RNCM order maximum home health services including speech therapy.   9:20am-CSW received return call from spouse requesting CIR evalution. CSW requested screen from team.   9:28am-CSW received return call from patient's spouse asking what the SNF options would be if she ended up feeling like she could not care for patient. CSW provided her with SNF bed offers. She reported understanding and would like to hear from CIR and therapy today before deciding on home health or SNF.    10:21 AM-CSW heard back from CIR and they feel patient is not appropriate for CIR level rehab. CSW will update spouse.   2:08 PM-CSW spoke with patient's spouse and she is requesting [facility]. CSW inquiring with [facility] on bed availability.    3:34 PM-Per [facility] they require no Haldol for 48 hours so they can accept patient Saturday if no behaviors. CSW initiating insurance process.     Expected Discharge Plan: SNF Barriers to Discharge: Continued Medical Work up   Patient Goals and CMS Choice Patient states their goals for this hospitalization and ongoing recovery are:: Return home CMS Medicare.gov Compare Post Acute Care list provided to:: Patient Represenative (must comment) Choice offered to / list presented to : Spouse [place] ownership interest in [facility].provided to:: Spouse    Expected Discharge  Plan and Services In-house Referral: Clinical Social Work Discharge Planning Services: CM Consult Post Acute Care Choice: Home Health Living arrangements for the past 2 months: Single Family Home                                      Prior Living Arrangements/Services Living arrangements for the past 2 months: Single Family Home Lives with:: Spouse Patient language and need for interpreter reviewed:: Yes Do you feel safe going back to the place where you live?: Yes      Need for Family Participation in Patient Care: Yes (Comment) Care giver support system in place?: Yes (comment)   Criminal Activity/Legal Involvement Pertinent to Current Situation/Hospitalization: No - Comment as needed  Activities of Daily Living   ADL Screening (condition at time of admission) Independently performs ADLs?: No Does the patient have a NEW difficulty with bathing/dressing/toileting/self-feeding that is expected to last >3 days?: No Does the patient have a NEW difficulty with getting in/out of bed, walking, or climbing stairs that is expected to last >3 days?: Yes (Initiates electronic notice to provider for possible PT consult) Does the patient have a NEW difficulty with communication that is expected to last >3 days?: No Is the patient deaf or have difficulty hearing?: No Does the patient have difficulty seeing, even when wearing glasses/contacts?: Yes Does the patient have difficulty concentrating, remembering, or making decisions?: Yes  Permission Sought/Granted Permission sought to share information  with : Magazine features editor, Family Supports Permission granted to share information with : Yes, Verbal Permission Granted        Permission granted to share info w Relationship: Spouse     Emotional Assessment Appearance:: Appears stated age Attitude/Demeanor/Rapport: Unable to Assess Affect (typically observed): Unable to Assess Orientation: : Oriented to Self Alcohol /  Substance Use: Not Applicable Psych Involvement: No (comment)  Admission diagnosis:  Aphasia [R47.01] Altered mental status, unspecified altered mental status type [R41.82] AMS (altered mental status) [R41.82] Patient Active Problem List   Diagnosis Date Noted   AMS (altered mental status) 03/15/2023   Hypoglycemia 03/15/2023   UTI (urinary tract infection) 03/15/2023   Glaucoma 03/15/2023   Gout 03/15/2023   Hypotension 10/19/2022   Dementia (HCC) 07/02/2022   Generalized weakness 07/02/2022   Sepsis (HCC) 06/26/2021   Acute metabolic encephalopathy 05/31/2021   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   Fever and chills 04/01/2021   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   Complete heart block (HCC)    Heart block    Syncope 05/03/2020   History of stroke 05/03/2020   History of supraventricular tachycardia 05/03/2020   Thrombocytopenia (HCC) 05/03/2020   AKI (acute kidney injury) (HCC) 05/03/2020   Memory impairment 05/03/2020   Atypical chest pain 01/27/2020   SVT (supraventricular tachycardia) (HCC) 06/03/2017   Dizziness 08/02/2014   Cerebral infarction (HCC) 09/13/2012   Vertigo 09/12/2012   EAR PAIN 09/05/2009   BACK PAIN 06/20/2009   COLONIC POLYPS 05/06/2007   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   NONDEPENDENT ALCOHOL ABUSE IN REMISSION 05/06/2007   Essential hypertension 05/06/2007   Hypertensive heart disease without heart failure 05/06/2007   INTRACRANIAL HEMORRHAGE 05/06/2007   TIA (transient ischemic attack) 05/06/2007   HEMORRHOIDS 05/06/2007   INGUINAL HERNIA, RIGHT 05/06/2007   RENAL CYST 05/06/2007   HEMATURIA UNSPECIFIED 05/06/2007   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   PSA, INCREASED 05/06/2007   LIVER FUNCTION TESTS, ABNORMAL 05/06/2007   Benign prostatic hyperplasia with urinary obstruction 05/06/2007   PCP:  Clinic, [facility]:   [facility] [address] [place] - [address] [place]  [place] [address] Phone: [phone] Fax: [phone]  [facility] - [place], [place] - [address] [place] [address] Phone: [phone] Fax: [phone]     Social Determinants of Health (SDOH) Social History: SDOH Screenings   Food Insecurity: No Food Insecurity (03/15/2023)  Housing: Low Risk  (03/15/2023)  Transportation Needs: No Transportation Needs (03/15/2023)  Utilities: Not At Risk (03/15/2023)  Social Connections: Unknown (10/14/2021)   Received from [facility]  Tobacco Use: Medium Risk (02/08/2023)   SDOH Interventions:     Readmission Risk Interventions     No data to display

## 2023-03-19 NOTE — Plan of Care (Signed)
  Problem: Education: Goal: Understanding of CV disease, CV risk reduction, and recovery process will improve Outcome: Progressing Goal: Individualized Educational Video(s) Outcome: Progressing   Problem: Activity: Goal: Ability to return to baseline activity level will improve Outcome: Progressing   Problem: Cardiovascular: Goal: Ability to achieve and maintain adequate cardiovascular perfusion will improve Outcome: Progressing Goal: Vascular access site(s) Level 0-1 will be maintained Outcome: Progressing   Problem: Health Behavior/Discharge Planning: Goal: Ability to safely manage health-related needs after discharge will improve Outcome: Progressing   Problem: Education: Goal: Knowledge of condition and prescribed therapy will improve Outcome: Progressing   Problem: Cardiac: Goal: Will achieve and/or maintain adequate cardiac output Outcome: Progressing   Problem: Physical Regulation: Goal: Complications related to the disease process, condition or treatment will be avoided or minimized Outcome: Progressing   Problem: Education: Goal: Knowledge of General Education information will improve Description: Including pain rating scale, medication(s)/side effects and non-pharmacologic comfort measures Outcome: Progressing   Problem: Health Behavior/Discharge Planning: Goal: Ability to manage health-related needs will improve Outcome: Progressing   Problem: Clinical Measurements: Goal: Ability to maintain clinical measurements within normal limits will improve Outcome: Progressing Goal: Will remain free from infection Outcome: Progressing Goal: Diagnostic test results will improve Outcome: Progressing Goal: Respiratory complications will improve Outcome: Progressing Goal: Cardiovascular complication will be avoided Outcome: Progressing   Problem: Nutrition: Goal: Adequate nutrition will be maintained Outcome: Progressing   Problem: Activity: Goal: Risk for  activity intolerance will decrease Outcome: Progressing   Problem: Coping: Goal: Level of anxiety will decrease Outcome: Progressing   Problem: Elimination: Goal: Will not experience complications related to bowel motility Outcome: Progressing Goal: Will not experience complications related to urinary retention Outcome: Progressing   Problem: Pain Managment: Goal: General experience of comfort will improve Outcome: Progressing   Problem: Safety: Goal: Ability to remain free from injury will improve Outcome: Progressing   Problem: Skin Integrity: Goal: Risk for impaired skin integrity will decrease Outcome: Progressing   Problem: Education: Goal: Knowledge of disease or condition will improve Outcome: Progressing Goal: Knowledge of secondary prevention will improve (MUST DOCUMENT ALL) Outcome: Progressing Goal: Knowledge of patient specific risk factors will improve Loraine Leriche N/A or DELETE if not current risk factor) Outcome: Progressing   Problem: Ischemic Stroke/TIA Tissue Perfusion: Goal: Complications of ischemic stroke/TIA will be minimized Outcome: Progressing   Problem: Coping: Goal: Will verbalize positive feelings about self Outcome: Progressing Goal: Will identify appropriate support needs Outcome: Progressing   Problem: Health Behavior/Discharge Planning: Goal: Ability to manage health-related needs will improve Outcome: Progressing Goal: Goals will be collaboratively established with patient/family Outcome: Progressing   Problem: Self-Care: Goal: Ability to participate in self-care as condition permits will improve Outcome: Progressing Goal: Verbalization of feelings and concerns over difficulty with self-care will improve Outcome: Progressing Goal: Ability to communicate needs accurately will improve Outcome: Progressing   Problem: Nutrition: Goal: Risk of aspiration will decrease Outcome: Progressing Goal: Dietary intake will improve Outcome:  Progressing   Problem: Safety: Goal: Non-violent Restraint(s) Outcome: Progressing   Problem: Education: Goal: Understanding of post-operative needs will improve Outcome: Progressing Goal: Individualized Educational Video(s) Outcome: Progressing   Problem: Clinical Measurements: Goal: Postoperative complications will be avoided or minimized Outcome: Progressing   Problem: Respiratory: Goal: Will regain and/or maintain adequate ventilation Outcome: Progressing

## 2023-03-19 NOTE — Progress Notes (Signed)
Mobility Specialist Progress Note;   03/19/23 1020  Mobility  Activity Transferred from bed to chair  Level of Assistance Minimal assist, patient does 75% or more (+2)  Assistive Device Front wheel walker  Activity Response Tolerated well  Mobility Referral Yes  $Mobility charge 1 Mobility  Mobility Specialist Start Time (ACUTE ONLY) 1020  Mobility Specialist Stop Time (ACUTE ONLY) 1040  Mobility Specialist Time Calculation (min) (ACUTE ONLY) 20 min    Post-mobility: BP 151/103 (117)  Pt agreeable to mobility. Required minA +2 for STS and ambulation. Pt required heavy verbal cues for directional movement and foot advancement. No c/o during session. Pt left in chair with all needs met. Alarm on.   Caesar Bookman Mobility Specialist Please contact via SecureChat or Rehab Office [phone]

## 2023-03-19 NOTE — Progress Notes (Signed)
Occupational Therapy Treatment Patient Details Name: Dustin Hamilton MRN: [id number] DOB: Dec 26, 1940 Today's Date: 03/19/2023   History of present illness The pt is an 82 yo male presenting 10/12 with aphasia and AMS. CT with no acute abnormalities. Pt with significant fluctuations in BP, and with BG to 70 on admission, UA also with possible UTI. 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 PMH includes: dementia, glaucoma, L occipital lobe parenchymal hemorrhage, L hippocampus/amygdala infarct, hx of alcohol and IV drug use, HTN, pacemaker, recurrent C. Difficile, BPH.   OT comments  Pt continuing to progress during OT sessions, during visual assessment it was noted that pt cannot make out fine detail and was educated on scanning and the use to touch to make out objects/obstacles when needed. Pt did well with gait today, needing min A + RW to ambulate ~84ft but upon return to bed pt legs began to give out and he could no longer take steps. Pt safely positioned in transport chair, noted to have an increase and BP and HR during session with the RN aware. OT to continue to progress pt as able. DC plans remain appropriate for SNF at this time.       If plan is discharge home, recommend the following:  Assistance with cooking/housework;Direct supervision/assist for medications management;Direct supervision/assist for financial management;Assist for transportation;Supervision due to cognitive status;Help with stairs or ramp for entrance;A little help with walking and/or transfers;A lot of help with bathing/dressing/bathroom   Equipment Recommendations  Wheelchair (measurements OT);Wheelchair cushion (measurements OT);BSC/3in1    Recommendations for Other Services      Precautions / Restrictions Precautions Precautions: Fall Precaution Comments: SBP <140 Restrictions Weight Bearing Restrictions: No        Mobility Bed Mobility Overal bed mobility: Needs Assistance Bed Mobility: Sidelying to Sit, Supine to Sit   Sidelying to sit: Mod assist   Sit to supine: Contact guard assist   General bed mobility comments: cues for pt to use hand rails    Transfers Overall transfer level: Needs assistance Equipment used: Rolling walker (2 wheels) Transfers: Sit to/from Stand, Bed to chair/wheelchair/BSC Sit to Stand: Min assist Stand pivot transfers: Mod assist         General transfer comment: STS from EOB min A, STS from transport chair x3 max A for wiping. Stand pivot from transport chair to recliner     Balance Overall balance assessment: Needs assistance Sitting-balance support: Feet supported, Bilateral upper extremity supported Sitting balance-Leahy Scale: Fair     Standing balance support: Bilateral upper extremity supported, During functional activity Standing balance-Leahy Scale: Poor Standing balance comment: RW support needed                           ADL either performed or assessed with clinical judgement   ADL Overall ADL's : Needs assistance/impaired                             Toileting- Clothing Manipulation and Hygiene: Maximal assistance;Sit to/from stand Toileting - Clothing Manipulation Details (indicate cue type and reason): Max A for assist and need help to maintain balance, rec +2 with rear pericare     Functional mobility during ADLs: Minimal assistance;Rolling walker (2 wheels) General ADL Comments: Pt declining need for toileting, focused session on ambulation. Educated pt on visual strategies such as scanning L<>R and using sense  of touch to make out objects/obstacles better due in reduced fine detail. Used family in therapy session to increase attention to R side, discussed with them to place objects on R to reinforce attention to that side    Extremity/Trunk Assessment              Vision   Additional Comments:  Undershoots with both eyes open. Educated on vision stratgies (see ADLs). Able to trace the contrast line to make out the letter "B"   Perception     Praxis      Cognition Arousal: Alert Behavior During Therapy: Flat affect   Area of Impairment: Following commands, Safety/judgement, Awareness, Problem solving                       Following Commands: Follows one step commands inconsistently, Follows one step commands with increased time Safety/Judgement: Decreased awareness of deficits Awareness: Intellectual Problem Solving: Slow processing, Requires tactile cues, Requires verbal cues General Comments: pleasant, needs repetition to follow single step instructions        Exercises      Shoulder Instructions       General Comments Bp 131/94 (105) sitting EOB, 151/110(124) at end of session while sitting in transport chair. Pt did report feeling light headed, pt did need several cues to relax his arms during BP checks. RN notified of event    Pertinent Vitals/ Pain       Pain Assessment Pain Assessment: No/denies pain  Home Living                                          Prior Functioning/Environment              Frequency  Min 1X/week        Progress Toward Goals  OT Goals(current goals can now be found in the care plan section)  Progress towards OT goals: Progressing toward goals  Acute Rehab OT Goals Patient Stated Goal: family wants pt to go home vs rehab OT Goal Formulation: With patient/family Time For Goal Achievement: 03/29/23 Potential to Achieve Goals: Good  Plan      Co-evaluation                 AM-PAC OT "6 Clicks" Daily Activity     Outcome Measure   Help from another person eating meals?: A Lot Help from another person taking care of personal grooming?: A Little Help from another person toileting, which includes using toliet, bedpan, or urinal?: A Lot Help from another person bathing (including  washing, rinsing, drying)?: A Lot Help from another person to put on and taking off regular upper body clothing?: A Lot Help from another person to put on and taking off regular lower body clothing?: A Lot 6 Click Score: 13    End of Session Equipment Utilized During Treatment: Gait belt;Rolling walker (2 wheels)  OT Visit Diagnosis: Unsteadiness on feet (R26.81);Other abnormalities of gait and mobility (R26.89);Muscle weakness (generalized) (M62.81);Other symptoms and signs involving the nervous system (R29.898);Other symptoms and signs involving cognitive function;Cognitive communication deficit (R41.841);Low vision, both eyes (H54.2) Symptoms and signs involving cognitive functions: Cerebral infarction   Activity Tolerance Patient tolerated treatment well   Patient Left in bed;with call bell/phone within reach;with family/visitor present;with bed alarm set   Nurse Communication Mobility status        Time: [phone]  OT Time Calculation (min): 55 min  Charges: OT General Charges $OT Visit: 1 Visit OT Treatments $Self Care/Home Management : 8-22 mins $Therapeutic Activity: 38-52 mins  03/19/2023  AB, OTR/L  Acute Rehabilitation Services  Office: [phone]   Tristan Schroeder 03/19/2023, 3:52 PM

## 2023-03-20 DIAGNOSIS — R4182 Altered mental status, unspecified: Secondary | ICD-10-CM | POA: Diagnosis not present

## 2023-03-20 DIAGNOSIS — I639 Cerebral infarction, unspecified: Secondary | ICD-10-CM | POA: Diagnosis not present

## 2023-03-20 LAB — BASIC METABOLIC PANEL
Anion gap: 11 (ref 5–15)
BUN: 16 mg/dL (ref 8–23)
CO2: 25 mmol/L (ref 22–32)
Calcium: 9.9 mg/dL (ref 8.9–10.3)
Chloride: 104 mmol/L (ref 98–111)
Creatinine, Ser: 0.92 mg/dL (ref 0.61–1.24)
GFR, Estimated: >60 mL/min (ref >60)
Glucose, Bld: 104 mg/dL — ABNORMAL HIGH (ref 70–99)
Potassium: 3.8 mmol/L (ref 3.5–5.1)
Sodium: 140 mmol/L (ref 135–145)

## 2023-03-20 LAB — CBC WITH DIFFERENTIAL/PLATELET
Abs Immature Granulocytes: 0.02 10*3/uL (ref 0.00–0.07)
Basophils Absolute: 0 10*3/uL (ref 0.0–0.1)
Basophils Relative: 0 %
Eosinophils Absolute: 0.2 10*3/uL (ref 0.0–0.5)
Eosinophils Relative: 4 %
HCT: 45.6 % (ref 39.0–52.0)
Hemoglobin: 15.4 g/dL (ref 13.0–17.0)
Immature Granulocytes: 0 %
Lymphocytes Relative: 28 %
Lymphs Abs: 1.5 10*3/uL (ref 0.7–4.0)
MCH: 31 pg (ref 26.0–34.0)
MCHC: 33.8 g/dL (ref 30.0–36.0)
MCV: 91.8 fL (ref 80.0–100.0)
Monocytes Absolute: 0.4 10*3/uL (ref 0.1–1.0)
Monocytes Relative: 7 %
Neutro Abs: 3.1 10*3/uL (ref 1.7–7.7)
Neutrophils Relative %: 61 %
Platelets: 129 10*3/uL — ABNORMAL LOW (ref 150–400)
RBC: 4.97 MIL/uL (ref 4.22–5.81)
RDW: 14.3 % (ref 11.5–15.5)
WBC: 5.2 10*3/uL (ref 4.0–10.5)
nRBC: 0 % (ref 0.0–0.2)

## 2023-03-20 LAB — GLUCOSE, CAPILLARY
Glucose-Capillary: 104 mg/dL — ABNORMAL HIGH (ref 70–99)
Glucose-Capillary: 96 mg/dL (ref 70–99)
Glucose-Capillary: 135 mg/dL — ABNORMAL HIGH (ref 70–99)
Glucose-Capillary: 98 mg/dL (ref 70–99)

## 2023-03-20 LAB — MAGNESIUM: Magnesium: 2 mg/dL (ref 1.7–2.4)

## 2023-03-20 LAB — PHOSPHORUS: Phosphorus: 2.4 mg/dL — ABNORMAL LOW (ref 2.5–4.6)

## 2023-03-20 LAB — BRAIN NATRIURETIC PEPTIDE: B Natriuretic Peptide: 106.2 pg/mL — ABNORMAL HIGH (ref 0.0–100.0)

## 2023-03-20 NOTE — TOC Progression Note (Signed)
Transition of Care [facility]) - Progression Note    Patient Details  Name: Dustin Hamilton MRN: [id number] Date of Birth: 03-Jun-1940  Transition of Care [facility]) CM/SW Contact  Mearl Latin, LCSW Phone Number: 03/20/2023, 4:08 PM  Clinical Narrative:    CSW received insurance approval for [place], Ref# [id number], Auth ID# [id number], effective 03/20/2023-03/24/2023.  CSW confirmed plan with [facility] to admit tomorrow pending no Haldol use overnight.   CSW updated patient's spouse and she requested [facility] for transport.    Expected Discharge Plan: Skilled Nursing Facility Barriers to Discharge: Continued Medical Work up  Expected Discharge Plan and Services In-house Referral: Clinical Social Work Discharge Planning Services: CM Consult Post Acute Care Choice: Skilled Nursing Facility Living arrangements for the past 2 months: Single Family Home                                       Social Determinants of Health (SDOH) Interventions SDOH Screenings   Food Insecurity: No Food Insecurity (03/15/2023)  Housing: Low Risk  (03/15/2023)  Transportation Needs: No Transportation Needs (03/15/2023)  Utilities: Not At Risk (03/15/2023)  Social Connections: Unknown (10/14/2021)   Received from [facility], [facility]  Tobacco Use: Medium Risk (02/08/2023)    Readmission Risk Interventions     No data to display

## 2023-03-20 NOTE — Plan of Care (Signed)
  Problem: Education: Goal: Understanding of CV disease, CV risk reduction, and recovery process will improve Outcome: Progressing Goal: Individualized Educational Video(s) Outcome: Progressing   Problem: Activity: Goal: Ability to return to baseline activity level will improve Outcome: Progressing   Problem: Cardiovascular: Goal: Ability to achieve and maintain adequate cardiovascular perfusion will improve Outcome: Progressing Goal: Vascular access site(s) Level 0-1 will be maintained Outcome: Progressing   Problem: Health Behavior/Discharge Planning: Goal: Ability to safely manage health-related needs after discharge will improve Outcome: Progressing   Problem: Education: Goal: Knowledge of condition and prescribed therapy will improve Outcome: Progressing   Problem: Cardiac: Goal: Will achieve and/or maintain adequate cardiac output Outcome: Progressing   Problem: Physical Regulation: Goal: Complications related to the disease process, condition or treatment will be avoided or minimized Outcome: Progressing   Problem: Education: Goal: Knowledge of General Education information will improve Description: Including pain rating scale, medication(s)/side effects and non-pharmacologic comfort measures Outcome: Progressing   Problem: Health Behavior/Discharge Planning: Goal: Ability to manage health-related needs will improve Outcome: Progressing   Problem: Clinical Measurements: Goal: Ability to maintain clinical measurements within normal limits will improve Outcome: Progressing Goal: Will remain free from infection Outcome: Progressing Goal: Diagnostic test results will improve Outcome: Progressing Goal: Respiratory complications will improve Outcome: Progressing Goal: Cardiovascular complication will be avoided Outcome: Progressing   Problem: Activity: Goal: Risk for activity intolerance will decrease Outcome: Progressing   Problem: Nutrition: Goal: Adequate  nutrition will be maintained Outcome: Progressing   Problem: Coping: Goal: Level of anxiety will decrease Outcome: Progressing   Problem: Elimination: Goal: Will not experience complications related to bowel motility Outcome: Progressing Goal: Will not experience complications related to urinary retention Outcome: Progressing   Problem: Pain Managment: Goal: General experience of comfort will improve Outcome: Progressing   Problem: Safety: Goal: Ability to remain free from injury will improve Outcome: Progressing   Problem: Skin Integrity: Goal: Risk for impaired skin integrity will decrease Outcome: Progressing   Problem: Education: Goal: Knowledge of disease or condition will improve Outcome: Progressing Goal: Knowledge of secondary prevention will improve (MUST DOCUMENT ALL) Outcome: Progressing Goal: Knowledge of patient specific risk factors will improve Loraine Leriche N/A or DELETE if not current risk factor) Outcome: Progressing   Problem: Ischemic Stroke/TIA Tissue Perfusion: Goal: Complications of ischemic stroke/TIA will be minimized Outcome: Progressing   Problem: Coping: Goal: Will verbalize positive feelings about self Outcome: Progressing Goal: Will identify appropriate support needs Outcome: Progressing   Problem: Health Behavior/Discharge Planning: Goal: Ability to manage health-related needs will improve Outcome: Progressing Goal: Goals will be collaboratively established with patient/family Outcome: Progressing   Problem: Self-Care: Goal: Ability to participate in self-care as condition permits will improve Outcome: Progressing Goal: Verbalization of feelings and concerns over difficulty with self-care will improve Outcome: Progressing Goal: Ability to communicate needs accurately will improve Outcome: Progressing   Problem: Nutrition: Goal: Risk of aspiration will decrease Outcome: Progressing Goal: Dietary intake will improve Outcome:  Progressing   Problem: Safety: Goal: Non-violent Restraint(s) Outcome: Progressing   Problem: Education: Goal: Understanding of post-operative needs will improve Outcome: Progressing Goal: Individualized Educational Video(s) Outcome: Progressing   Problem: Clinical Measurements: Goal: Postoperative complications will be avoided or minimized Outcome: Progressing   Problem: Respiratory: Goal: Will regain and/or maintain adequate ventilation Outcome: Progressing

## 2023-03-20 NOTE — Progress Notes (Signed)
No other area of hypodensity suggest additional acute infarct. No acute hemorrhage, mass, mass effect, or midline shift. No hydrocephalus or extra-axial collection. Unchanged ventriculomegaly. Periventricular white matter changes, likely the sequela of chronic small vessel ischemic disease. Vascular: No hyperdense vessel. Atherosclerotic calcifications in the intracranial carotid and vertebral  arteries. Skull: Negative for fracture or focal lesion. Sinuses/Orbits: No acute finding. Other: None. CTA NECK FINDINGS Aortic arch: Standard branching. Imaged portion shows no evidence of aneurysm or dissection. No significant stenosis of the major arch vessel origins. Right carotid system: No evidence of dissection, occlusion, or hemodynamically significant stenosis (greater than 50%). Left carotid system: No evidence of dissection, occlusion, or hemodynamically significant stenosis (greater than 50%). Vertebral arteries: No evidence of dissection, occlusion, or hemodynamically significant stenosis (greater than 50%). Skeleton: No acute osseous abnormality. Degenerative changes in the cervical spine. Other neck: Redemonstrated diffusely enlarged thyroid, which appears similar to the 05/01/2022 chest CT. Upper chest: No focal pulmonary opacity or pleural effusion. Review of the MIP images confirms the above findings CTA HEAD FINDINGS Anterior circulation: Both internal carotid arteries are patent to the termini, without significant stenosis. A1 segments patent. Normal anterior communicating artery. Anterior cerebral arteries are patent to their distal aspects without significant stenosis. No M1 stenosis or occlusion. MCA branches perfused to their distal aspects without significant stenosis. Posterior circulation: Vertebral arteries patent to the vertebrobasilar junction without significant stenosis. Posterior inferior cerebellar arteries patent proximally. Basilar patent to its distal aspect without significant stenosis. Superior cerebellar arteries patent proximally. Patent P1 segments. Occlusion of the proximal left P2 (series 11, images 106-111, with reconstitution in mid left P2; this is new compared to 2022. PCAs are otherwise perfused to their distal aspects without significant stenosis. The bilateral posterior communicating arteries are patent. Venous sinuses: As permitted by contrast timing, patent.  Anatomic variants: None significant. No evidence of aneurysm or vascular malformation. Review of the MIP images confirms the above findings IMPRESSION: 1. Occlusion of the proximal left P2, with reconstitution in mid left P2, which is new from the 2022 CTA. 2. No other intracranial large vessel occlusion or significant stenosis. 3. No hemodynamically significant stenosis in the neck. 4. Loss of gray-white differentiation in the left occipital lobe, which correlates with the acute infarct seen on the 03/17/2023 MRI. Insert peer E: Port Electronically Signed   By: Wiliam Ke M.D.   On: 03/17/2023 23:47   DG Swallowing Func-Speech Pathology  Result Date: 03/17/2023 Table formatting from the original result was not included. Modified Barium Swallow Study Patient Details Name: Dustin Hamilton MRN: [id number] Date of Birth: Mar 21, 1941 Today's Date: 03/17/2023 HPI/PMH: HPI: Dustin Hamilton is an 82 yo male presenting to ED 10/12 with AMS and acute onset of aphasia. UA with possible UTI. CXR with severe chronic elevation of R hemidiaphragm with increasing atelectasis. CTH negative. MRI Brain pending. MBS January 2024 with mild-moderate oropharyngeal dysphagia characterized by delayed swallow initiation, oropharyngeal residue resolved by cued multiple swallows, deep penetration of thin liquids. He was recommended to initiate a Dys 3 diet with nectar thick liquids at that time; however, pt's wife reports he does not like thickened liquids and is noncompliant with use. PMH includes cerebral amyloid angiopathy, L hippocampus/amygdala infarct, history of alcohol and IV drug use, history of ICH, dementia, HTN, recurrent C. Difficile, BPH Clinical Impression: Clinical Impression: Pt presents with a seemingly improved oropharyngeal swallow compared to previous MBS 06/11/22. He achieves good oral clearance and adequate BOT retraction with all consistencies administered. With solid  No other area of hypodensity suggest additional acute infarct. No acute hemorrhage, mass, mass effect, or midline shift. No hydrocephalus or extra-axial collection. Unchanged ventriculomegaly. Periventricular white matter changes, likely the sequela of chronic small vessel ischemic disease. Vascular: No hyperdense vessel. Atherosclerotic calcifications in the intracranial carotid and vertebral  arteries. Skull: Negative for fracture or focal lesion. Sinuses/Orbits: No acute finding. Other: None. CTA NECK FINDINGS Aortic arch: Standard branching. Imaged portion shows no evidence of aneurysm or dissection. No significant stenosis of the major arch vessel origins. Right carotid system: No evidence of dissection, occlusion, or hemodynamically significant stenosis (greater than 50%). Left carotid system: No evidence of dissection, occlusion, or hemodynamically significant stenosis (greater than 50%). Vertebral arteries: No evidence of dissection, occlusion, or hemodynamically significant stenosis (greater than 50%). Skeleton: No acute osseous abnormality. Degenerative changes in the cervical spine. Other neck: Redemonstrated diffusely enlarged thyroid, which appears similar to the 05/01/2022 chest CT. Upper chest: No focal pulmonary opacity or pleural effusion. Review of the MIP images confirms the above findings CTA HEAD FINDINGS Anterior circulation: Both internal carotid arteries are patent to the termini, without significant stenosis. A1 segments patent. Normal anterior communicating artery. Anterior cerebral arteries are patent to their distal aspects without significant stenosis. No M1 stenosis or occlusion. MCA branches perfused to their distal aspects without significant stenosis. Posterior circulation: Vertebral arteries patent to the vertebrobasilar junction without significant stenosis. Posterior inferior cerebellar arteries patent proximally. Basilar patent to its distal aspect without significant stenosis. Superior cerebellar arteries patent proximally. Patent P1 segments. Occlusion of the proximal left P2 (series 11, images 106-111, with reconstitution in mid left P2; this is new compared to 2022. PCAs are otherwise perfused to their distal aspects without significant stenosis. The bilateral posterior communicating arteries are patent. Venous sinuses: As permitted by contrast timing, patent.  Anatomic variants: None significant. No evidence of aneurysm or vascular malformation. Review of the MIP images confirms the above findings IMPRESSION: 1. Occlusion of the proximal left P2, with reconstitution in mid left P2, which is new from the 2022 CTA. 2. No other intracranial large vessel occlusion or significant stenosis. 3. No hemodynamically significant stenosis in the neck. 4. Loss of gray-white differentiation in the left occipital lobe, which correlates with the acute infarct seen on the 03/17/2023 MRI. Insert peer E: Port Electronically Signed   By: Wiliam Ke M.D.   On: 03/17/2023 23:47   DG Swallowing Func-Speech Pathology  Result Date: 03/17/2023 Table formatting from the original result was not included. Modified Barium Swallow Study Patient Details Name: Dustin Hamilton MRN: [id number] Date of Birth: Mar 21, 1941 Today's Date: 03/17/2023 HPI/PMH: HPI: Dustin Hamilton is an 82 yo male presenting to ED 10/12 with AMS and acute onset of aphasia. UA with possible UTI. CXR with severe chronic elevation of R hemidiaphragm with increasing atelectasis. CTH negative. MRI Brain pending. MBS January 2024 with mild-moderate oropharyngeal dysphagia characterized by delayed swallow initiation, oropharyngeal residue resolved by cued multiple swallows, deep penetration of thin liquids. He was recommended to initiate a Dys 3 diet with nectar thick liquids at that time; however, pt's wife reports he does not like thickened liquids and is noncompliant with use. PMH includes cerebral amyloid angiopathy, L hippocampus/amygdala infarct, history of alcohol and IV drug use, history of ICH, dementia, HTN, recurrent C. Difficile, BPH Clinical Impression: Clinical Impression: Pt presents with a seemingly improved oropharyngeal swallow compared to previous MBS 06/11/22. He achieves good oral clearance and adequate BOT retraction with all consistencies administered. With solid  PROGRESS NOTE                                                                                                                                                                                                             Patient Demographics:    Dustin Hamilton, is a 82 y.o. male, DOB - [birth year], ZOX:[id number]  Outpatient Primary [facility] the [facility] is [facility]    LOS - 4  Admit date - 03/14/2023    Chief Complaint  Patient presents with   Altered Mental Status       Brief Narrative (HPI from H&P)     82 y.o. 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  He was completely unresponsive on arrival.  His wife told ED physician that she went to take a shower around 2:15-2:30 PM and at that time patient was at his baseline.  When she checked on the patient at 4:50 PM, he was found unresponsive/collapsed at the dining room table.  In the ER head CT nonacute, EEG unremarkable, MRI ordered seen by neurology and admitted to the hospital.   Subjective:   Patient denies any complaints today, no significant events overnight as discussed with staff, pleasant.   Assessment  & Plan :    Acute CVA -MRI brain showing acute infarcts in bilateral PCA territory, left greater than right, involving bilateral occipital lobes, and acute infarct in the left hypothalamic region. -Neurology input greatly appreciated, patient is not on any antithrombotic prior to admission, now he is on aspirin 81 mg oral daily, due to to his history of ICH, with known background of CAA . -DL acceptable at 67  -W1X  acceptable at 5.8  -2D echo with EF 60 to 65%  -Recommendations for blood pressure control, SBP <140  -PT/OT is following.  Recommendation for SNF placement.  Altered mental status/unresponsiveness, metabolic encephalopathy   - Due to combination of UTI, dehydration, hypotension and mild hypoglycemia. -As well secondary to acute CVA. -As well he is not to have history of amyloidosis "spells" with underlying CA with extensive workup in the  No other area of hypodensity suggest additional acute infarct. No acute hemorrhage, mass, mass effect, or midline shift. No hydrocephalus or extra-axial collection. Unchanged ventriculomegaly. Periventricular white matter changes, likely the sequela of chronic small vessel ischemic disease. Vascular: No hyperdense vessel. Atherosclerotic calcifications in the intracranial carotid and vertebral  arteries. Skull: Negative for fracture or focal lesion. Sinuses/Orbits: No acute finding. Other: None. CTA NECK FINDINGS Aortic arch: Standard branching. Imaged portion shows no evidence of aneurysm or dissection. No significant stenosis of the major arch vessel origins. Right carotid system: No evidence of dissection, occlusion, or hemodynamically significant stenosis (greater than 50%). Left carotid system: No evidence of dissection, occlusion, or hemodynamically significant stenosis (greater than 50%). Vertebral arteries: No evidence of dissection, occlusion, or hemodynamically significant stenosis (greater than 50%). Skeleton: No acute osseous abnormality. Degenerative changes in the cervical spine. Other neck: Redemonstrated diffusely enlarged thyroid, which appears similar to the 05/01/2022 chest CT. Upper chest: No focal pulmonary opacity or pleural effusion. Review of the MIP images confirms the above findings CTA HEAD FINDINGS Anterior circulation: Both internal carotid arteries are patent to the termini, without significant stenosis. A1 segments patent. Normal anterior communicating artery. Anterior cerebral arteries are patent to their distal aspects without significant stenosis. No M1 stenosis or occlusion. MCA branches perfused to their distal aspects without significant stenosis. Posterior circulation: Vertebral arteries patent to the vertebrobasilar junction without significant stenosis. Posterior inferior cerebellar arteries patent proximally. Basilar patent to its distal aspect without significant stenosis. Superior cerebellar arteries patent proximally. Patent P1 segments. Occlusion of the proximal left P2 (series 11, images 106-111, with reconstitution in mid left P2; this is new compared to 2022. PCAs are otherwise perfused to their distal aspects without significant stenosis. The bilateral posterior communicating arteries are patent. Venous sinuses: As permitted by contrast timing, patent.  Anatomic variants: None significant. No evidence of aneurysm or vascular malformation. Review of the MIP images confirms the above findings IMPRESSION: 1. Occlusion of the proximal left P2, with reconstitution in mid left P2, which is new from the 2022 CTA. 2. No other intracranial large vessel occlusion or significant stenosis. 3. No hemodynamically significant stenosis in the neck. 4. Loss of gray-white differentiation in the left occipital lobe, which correlates with the acute infarct seen on the 03/17/2023 MRI. Insert peer E: Port Electronically Signed   By: Wiliam Ke M.D.   On: 03/17/2023 23:47   DG Swallowing Func-Speech Pathology  Result Date: 03/17/2023 Table formatting from the original result was not included. Modified Barium Swallow Study Patient Details Name: Dustin Hamilton MRN: [id number] Date of Birth: Mar 21, 1941 Today's Date: 03/17/2023 HPI/PMH: HPI: Dustin Hamilton is an 82 yo male presenting to ED 10/12 with AMS and acute onset of aphasia. UA with possible UTI. CXR with severe chronic elevation of R hemidiaphragm with increasing atelectasis. CTH negative. MRI Brain pending. MBS January 2024 with mild-moderate oropharyngeal dysphagia characterized by delayed swallow initiation, oropharyngeal residue resolved by cued multiple swallows, deep penetration of thin liquids. He was recommended to initiate a Dys 3 diet with nectar thick liquids at that time; however, pt's wife reports he does not like thickened liquids and is noncompliant with use. PMH includes cerebral amyloid angiopathy, L hippocampus/amygdala infarct, history of alcohol and IV drug use, history of ICH, dementia, HTN, recurrent C. Difficile, BPH Clinical Impression: Clinical Impression: Pt presents with a seemingly improved oropharyngeal swallow compared to previous MBS 06/11/22. He achieves good oral clearance and adequate BOT retraction with all consistencies administered. With solid  No other area of hypodensity suggest additional acute infarct. No acute hemorrhage, mass, mass effect, or midline shift. No hydrocephalus or extra-axial collection. Unchanged ventriculomegaly. Periventricular white matter changes, likely the sequela of chronic small vessel ischemic disease. Vascular: No hyperdense vessel. Atherosclerotic calcifications in the intracranial carotid and vertebral  arteries. Skull: Negative for fracture or focal lesion. Sinuses/Orbits: No acute finding. Other: None. CTA NECK FINDINGS Aortic arch: Standard branching. Imaged portion shows no evidence of aneurysm or dissection. No significant stenosis of the major arch vessel origins. Right carotid system: No evidence of dissection, occlusion, or hemodynamically significant stenosis (greater than 50%). Left carotid system: No evidence of dissection, occlusion, or hemodynamically significant stenosis (greater than 50%). Vertebral arteries: No evidence of dissection, occlusion, or hemodynamically significant stenosis (greater than 50%). Skeleton: No acute osseous abnormality. Degenerative changes in the cervical spine. Other neck: Redemonstrated diffusely enlarged thyroid, which appears similar to the 05/01/2022 chest CT. Upper chest: No focal pulmonary opacity or pleural effusion. Review of the MIP images confirms the above findings CTA HEAD FINDINGS Anterior circulation: Both internal carotid arteries are patent to the termini, without significant stenosis. A1 segments patent. Normal anterior communicating artery. Anterior cerebral arteries are patent to their distal aspects without significant stenosis. No M1 stenosis or occlusion. MCA branches perfused to their distal aspects without significant stenosis. Posterior circulation: Vertebral arteries patent to the vertebrobasilar junction without significant stenosis. Posterior inferior cerebellar arteries patent proximally. Basilar patent to its distal aspect without significant stenosis. Superior cerebellar arteries patent proximally. Patent P1 segments. Occlusion of the proximal left P2 (series 11, images 106-111, with reconstitution in mid left P2; this is new compared to 2022. PCAs are otherwise perfused to their distal aspects without significant stenosis. The bilateral posterior communicating arteries are patent. Venous sinuses: As permitted by contrast timing, patent.  Anatomic variants: None significant. No evidence of aneurysm or vascular malformation. Review of the MIP images confirms the above findings IMPRESSION: 1. Occlusion of the proximal left P2, with reconstitution in mid left P2, which is new from the 2022 CTA. 2. No other intracranial large vessel occlusion or significant stenosis. 3. No hemodynamically significant stenosis in the neck. 4. Loss of gray-white differentiation in the left occipital lobe, which correlates with the acute infarct seen on the 03/17/2023 MRI. Insert peer E: Port Electronically Signed   By: Wiliam Ke M.D.   On: 03/17/2023 23:47   DG Swallowing Func-Speech Pathology  Result Date: 03/17/2023 Table formatting from the original result was not included. Modified Barium Swallow Study Patient Details Name: Dustin Hamilton MRN: [id number] Date of Birth: Mar 21, 1941 Today's Date: 03/17/2023 HPI/PMH: HPI: Dustin Hamilton is an 82 yo male presenting to ED 10/12 with AMS and acute onset of aphasia. UA with possible UTI. CXR with severe chronic elevation of R hemidiaphragm with increasing atelectasis. CTH negative. MRI Brain pending. MBS January 2024 with mild-moderate oropharyngeal dysphagia characterized by delayed swallow initiation, oropharyngeal residue resolved by cued multiple swallows, deep penetration of thin liquids. He was recommended to initiate a Dys 3 diet with nectar thick liquids at that time; however, pt's wife reports he does not like thickened liquids and is noncompliant with use. PMH includes cerebral amyloid angiopathy, L hippocampus/amygdala infarct, history of alcohol and IV drug use, history of ICH, dementia, HTN, recurrent C. Difficile, BPH Clinical Impression: Clinical Impression: Pt presents with a seemingly improved oropharyngeal swallow compared to previous MBS 06/11/22. He achieves good oral clearance and adequate BOT retraction with all consistencies administered. With solid  PROGRESS NOTE                                                                                                                                                                                                             Patient Demographics:    Dustin Hamilton, is a 82 y.o. male, DOB - [birth year], ZOX:[id number]  Outpatient Primary [facility] the patient is [facility]    LOS - 4  Admit date - 03/14/2023    Chief Complaint  Patient presents with   Altered Mental Status       Brief Narrative (HPI from H&P)     82 y.o. 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  He was completely unresponsive on arrival.  His wife told ED physician that she went to take a shower around 2:15-2:30 PM and at that time patient was at his baseline.  When she checked on the patient at 4:50 PM, he was found unresponsive/collapsed at the dining room table.  In the ER head CT nonacute, EEG unremarkable, MRI ordered seen by neurology and admitted to the hospital.   Subjective:   Patient denies any complaints today, no significant events overnight as discussed with staff, pleasant.   Assessment  & Plan :    Acute CVA -MRI brain showing acute infarcts in bilateral PCA territory, left greater than right, involving bilateral occipital lobes, and acute infarct in the left hypothalamic region. -Neurology input greatly appreciated, patient is not on any antithrombotic prior to admission, now he is on aspirin 81 mg oral daily, due to to his history of ICH, with known background of CAA . -DL acceptable at 67  -W1X  acceptable at 5.8  -2D echo with EF 60 to 65%  -Recommendations for blood pressure control, SBP <140  -PT/OT is following.  Recommendation for SNF placement.  Altered mental status/unresponsiveness, metabolic encephalopathy   - Due to combination of UTI, dehydration, hypotension and mild hypoglycemia. -As well secondary to acute CVA. -As well he is not to have history of amyloidosis "spells" with underlying CA with extensive workup in the  No other area of hypodensity suggest additional acute infarct. No acute hemorrhage, mass, mass effect, or midline shift. No hydrocephalus or extra-axial collection. Unchanged ventriculomegaly. Periventricular white matter changes, likely the sequela of chronic small vessel ischemic disease. Vascular: No hyperdense vessel. Atherosclerotic calcifications in the intracranial carotid and vertebral  arteries. Skull: Negative for fracture or focal lesion. Sinuses/Orbits: No acute finding. Other: None. CTA NECK FINDINGS Aortic arch: Standard branching. Imaged portion shows no evidence of aneurysm or dissection. No significant stenosis of the major arch vessel origins. Right carotid system: No evidence of dissection, occlusion, or hemodynamically significant stenosis (greater than 50%). Left carotid system: No evidence of dissection, occlusion, or hemodynamically significant stenosis (greater than 50%). Vertebral arteries: No evidence of dissection, occlusion, or hemodynamically significant stenosis (greater than 50%). Skeleton: No acute osseous abnormality. Degenerative changes in the cervical spine. Other neck: Redemonstrated diffusely enlarged thyroid, which appears similar to the 05/01/2022 chest CT. Upper chest: No focal pulmonary opacity or pleural effusion. Review of the MIP images confirms the above findings CTA HEAD FINDINGS Anterior circulation: Both internal carotid arteries are patent to the termini, without significant stenosis. A1 segments patent. Normal anterior communicating artery. Anterior cerebral arteries are patent to their distal aspects without significant stenosis. No M1 stenosis or occlusion. MCA branches perfused to their distal aspects without significant stenosis. Posterior circulation: Vertebral arteries patent to the vertebrobasilar junction without significant stenosis. Posterior inferior cerebellar arteries patent proximally. Basilar patent to its distal aspect without significant stenosis. Superior cerebellar arteries patent proximally. Patent P1 segments. Occlusion of the proximal left P2 (series 11, images 106-111, with reconstitution in mid left P2; this is new compared to 2022. PCAs are otherwise perfused to their distal aspects without significant stenosis. The bilateral posterior communicating arteries are patent. Venous sinuses: As permitted by contrast timing, patent.  Anatomic variants: None significant. No evidence of aneurysm or vascular malformation. Review of the MIP images confirms the above findings IMPRESSION: 1. Occlusion of the proximal left P2, with reconstitution in mid left P2, which is new from the 2022 CTA. 2. No other intracranial large vessel occlusion or significant stenosis. 3. No hemodynamically significant stenosis in the neck. 4. Loss of gray-white differentiation in the left occipital lobe, which correlates with the acute infarct seen on the 03/17/2023 MRI. Insert peer E: Port Electronically Signed   By: Wiliam Ke M.D.   On: 03/17/2023 23:47   DG Swallowing Func-Speech Pathology  Result Date: 03/17/2023 Table formatting from the original result was not included. Modified Barium Swallow Study Patient Details Name: Dustin Hamilton MRN: [id number] Date of Birth: Mar 21, 1941 Today's Date: 03/17/2023 HPI/PMH: HPI: Dustin Hamilton is an 82 yo male presenting to ED 10/12 with AMS and acute onset of aphasia. UA with possible UTI. CXR with severe chronic elevation of R hemidiaphragm with increasing atelectasis. CTH negative. MRI Brain pending. MBS January 2024 with mild-moderate oropharyngeal dysphagia characterized by delayed swallow initiation, oropharyngeal residue resolved by cued multiple swallows, deep penetration of thin liquids. He was recommended to initiate a Dys 3 diet with nectar thick liquids at that time; however, pt's wife reports he does not like thickened liquids and is noncompliant with use. PMH includes cerebral amyloid angiopathy, L hippocampus/amygdala infarct, history of alcohol and IV drug use, history of ICH, dementia, HTN, recurrent C. Difficile, BPH Clinical Impression: Clinical Impression: Pt presents with a seemingly improved oropharyngeal swallow compared to previous MBS 06/11/22. He achieves good oral clearance and adequate BOT retraction with all consistencies administered. With solid  No other area of hypodensity suggest additional acute infarct. No acute hemorrhage, mass, mass effect, or midline shift. No hydrocephalus or extra-axial collection. Unchanged ventriculomegaly. Periventricular white matter changes, likely the sequela of chronic small vessel ischemic disease. Vascular: No hyperdense vessel. Atherosclerotic calcifications in the intracranial carotid and vertebral  arteries. Skull: Negative for fracture or focal lesion. Sinuses/Orbits: No acute finding. Other: None. CTA NECK FINDINGS Aortic arch: Standard branching. Imaged portion shows no evidence of aneurysm or dissection. No significant stenosis of the major arch vessel origins. Right carotid system: No evidence of dissection, occlusion, or hemodynamically significant stenosis (greater than 50%). Left carotid system: No evidence of dissection, occlusion, or hemodynamically significant stenosis (greater than 50%). Vertebral arteries: No evidence of dissection, occlusion, or hemodynamically significant stenosis (greater than 50%). Skeleton: No acute osseous abnormality. Degenerative changes in the cervical spine. Other neck: Redemonstrated diffusely enlarged thyroid, which appears similar to the 05/01/2022 chest CT. Upper chest: No focal pulmonary opacity or pleural effusion. Review of the MIP images confirms the above findings CTA HEAD FINDINGS Anterior circulation: Both internal carotid arteries are patent to the termini, without significant stenosis. A1 segments patent. Normal anterior communicating artery. Anterior cerebral arteries are patent to their distal aspects without significant stenosis. No M1 stenosis or occlusion. MCA branches perfused to their distal aspects without significant stenosis. Posterior circulation: Vertebral arteries patent to the vertebrobasilar junction without significant stenosis. Posterior inferior cerebellar arteries patent proximally. Basilar patent to its distal aspect without significant stenosis. Superior cerebellar arteries patent proximally. Patent P1 segments. Occlusion of the proximal left P2 (series 11, images 106-111, with reconstitution in mid left P2; this is new compared to 2022. PCAs are otherwise perfused to their distal aspects without significant stenosis. The bilateral posterior communicating arteries are patent. Venous sinuses: As permitted by contrast timing, patent.  Anatomic variants: None significant. No evidence of aneurysm or vascular malformation. Review of the MIP images confirms the above findings IMPRESSION: 1. Occlusion of the proximal left P2, with reconstitution in mid left P2, which is new from the 2022 CTA. 2. No other intracranial large vessel occlusion or significant stenosis. 3. No hemodynamically significant stenosis in the neck. 4. Loss of gray-white differentiation in the left occipital lobe, which correlates with the acute infarct seen on the 03/17/2023 MRI. Insert peer E: Port Electronically Signed   By: Wiliam Ke M.D.   On: 03/17/2023 23:47   DG Swallowing Func-Speech Pathology  Result Date: 03/17/2023 Table formatting from the original result was not included. Modified Barium Swallow Study Patient Details Name: Dustin Hamilton MRN: [id number] Date of Birth: Mar 21, 1941 Today's Date: 03/17/2023 HPI/PMH: HPI: Dustin Hamilton is an 82 yo male presenting to ED 10/12 with AMS and acute onset of aphasia. UA with possible UTI. CXR with severe chronic elevation of R hemidiaphragm with increasing atelectasis. CTH negative. MRI Brain pending. MBS January 2024 with mild-moderate oropharyngeal dysphagia characterized by delayed swallow initiation, oropharyngeal residue resolved by cued multiple swallows, deep penetration of thin liquids. He was recommended to initiate a Dys 3 diet with nectar thick liquids at that time; however, pt's wife reports he does not like thickened liquids and is noncompliant with use. PMH includes cerebral amyloid angiopathy, L hippocampus/amygdala infarct, history of alcohol and IV drug use, history of ICH, dementia, HTN, recurrent C. Difficile, BPH Clinical Impression: Clinical Impression: Pt presents with a seemingly improved oropharyngeal swallow compared to previous MBS 06/11/22. He achieves good oral clearance and adequate BOT retraction with all consistencies administered. With solid

## 2023-03-20 NOTE — Progress Notes (Signed)
Mobility Specialist Progress Note;   03/20/23 1400  Mobility  Activity Ambulated with assistance in hallway  Level of Assistance Minimal assist, patient does 75% or more (+2 (chair follow))  Assistive Device Front wheel walker  Distance Ambulated (ft) 195 ft  Activity Response Tolerated well  Mobility Referral Yes  $Mobility charge 1 Mobility  Mobility Specialist Start Time (ACUTE ONLY) 1400  Mobility Specialist Stop Time (ACUTE ONLY) 1425  Mobility Specialist Time Calculation (min) (ACUTE ONLY) 25 min   Pt agreeable to mobility. This session, rolled pt in chair out into hallway and ambulated there, pt responded well to this. Took x1 sitting rest breaks d/t fatigue. Required MinA +2 (chair follow for rest breaks) during ambulation. Mod verbal cues for direction, hand placement, and feet advancement. Pt back in bed with all needs met. Bed alarm on.  Caesar Bookman Mobility Specialist Please contact via SecureChat or Rehab Office [phone]

## 2023-03-20 NOTE — Progress Notes (Signed)
Physical Therapy Treatment Patient Details Name: Dustin Hamilton MRN: [id number] DOB: December 06, 1940 Today's Date: 03/20/2023   History of Present Illness The pt is an 82 yo male presenting 10/12 with aphasia and AMS. CT with no acute abnormalities. Pt with significant fluctuations in BP, and with BG to 70 on admission, UA also with possible UTI. 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 PMH includes: dementia, glaucoma, L occipital lobe parenchymal hemorrhage, L hippocampus/amygdala infarct, hx of alcohol and IV drug use, HTN, pacemaker, recurrent C. Difficile, BPH.    PT Comments  Patient is cooperative throughout session. He was seated in the chair on arrival to room. Progressed gait distance this session. However patient continues to be limited by generalized weakness, fatigue with activity, and required seated rest break with ambulation in the room. Assistance is required for every standing bout and during ambulation for safety. Recommend rehabilitation after this hospital stay < 3 hours/day.    If plan is discharge home, recommend the following: Two people to help with walking and/or transfers;Two people to help with bathing/dressing/bathroom;Assistance with cooking/housework;Direct supervision/assist for medications management;Direct supervision/assist for financial management;Assist for transportation;Help with stairs or ramp for entrance;Supervision due to cognitive status   Can travel by private vehicle     No  Equipment Recommendations  Wheelchair cushion (measurements PT);Wheelchair (measurements PT) (if going home)    Recommendations for Other Services       Precautions / Restrictions Precautions Precautions: Fall Restrictions Weight Bearing Restrictions: No     Mobility  Bed Mobility               General bed mobility comments: not observed as patient sitting up on arrival and post  session    Transfers Overall transfer level: Needs assistance Equipment used: Rolling walker (2 wheels) Transfers: Sit to/from Stand Sit to Stand: Min assist, Mod assist           General transfer comment: Min A with standing from recliner chair. Mod A with standing from chair without arms. verbal cues for technique and hand placement to facilitate safety and independence    Ambulation/Gait Ambulation/Gait assistance: Min assist Gait Distance (Feet): 14 Feet (x 2 bouts) Assistive device: Rolling walker (2 wheels) Gait Pattern/deviations: Step-to pattern, Trunk flexed, Knee flexed in stance - right, Knee flexed in stance - left Gait velocity: decreased     General Gait Details: increased knee and trunk flexion with fatigue. chair follow for safety with one seated rest break required/requested by patient due to fatigue. increased assistance required with rolling walker negotiation with turns and cues to remain closer to rolling walker for support.   Stairs             Wheelchair Mobility     Tilt Bed    Modified Rankin (Stroke Patients Only)       Balance Overall balance assessment: Needs assistance Sitting-balance support: Feet supported, Bilateral upper extremity supported Sitting balance-Leahy Scale: Fair     Standing balance support: Bilateral upper extremity supported, During functional activity Standing balance-Leahy Scale: Poor Standing balance comment: RW support needed                            Cognition Arousal: Alert Behavior During Therapy: Flat affect Overall Cognitive Status: History of cognitive impairments - at baseline  General Comments: patient is able to follow single step commands with increased time        Exercises      General Comments        Pertinent Vitals/Pain Pain Assessment Pain Assessment: No/denies pain    Home Living                           Prior Function            PT Goals (current goals can now be found in the care plan section) Acute Rehab PT Goals Patient Stated Goal: none stated by pt, wife hopeful he can return home PT Goal Formulation: With patient/family Time For Goal Achievement: 03/29/23 Potential to Achieve Goals: Good Progress towards PT goals: Progressing toward goals    Frequency    Min 1X/week      PT Plan      Co-evaluation              AM-PAC PT "6 Clicks" Mobility   Outcome Measure  Help needed turning from your back to your side while in a flat bed without using bedrails?: A Lot Help needed moving from lying on your back to sitting on the side of a flat bed without using bedrails?: Total Help needed moving to and from a bed to a chair (including a wheelchair)?: Total Help needed standing up from a chair using your arms (e.g., wheelchair or bedside chair)?: Total Help needed to walk in hospital room?: Total Help needed climbing 3-5 steps with a railing? : Total 6 Click Score: 7    End of Session Equipment Utilized During Treatment: Gait belt Activity Tolerance: Patient limited by fatigue Patient left: in chair;with call bell/phone within reach;with chair alarm set   PT Visit Diagnosis: Unsteadiness on feet (R26.81);Other abnormalities of gait and mobility (R26.89);Muscle weakness (generalized) (M62.81)     Time: 0865-7846 PT Time Calculation (min) (ACUTE ONLY): 17 min  Charges:    $Therapeutic Activity: 8-22 mins PT General Charges $$ ACUTE PT VISIT: 1 Visit                     Dustin Hamilton, PT, MPT    Dustin Hamilton 03/20/2023, 11:26 AM

## 2023-03-21 DIAGNOSIS — Z9181 History of falling: Secondary | ICD-10-CM | POA: Diagnosis not present

## 2023-03-21 DIAGNOSIS — Z7189 Other specified counseling: Secondary | ICD-10-CM | POA: Diagnosis not present

## 2023-03-21 DIAGNOSIS — R279 Unspecified lack of coordination: Secondary | ICD-10-CM | POA: Diagnosis not present

## 2023-03-21 DIAGNOSIS — N138 Other obstructive and reflux uropathy: Secondary | ICD-10-CM | POA: Diagnosis not present

## 2023-03-21 DIAGNOSIS — I639 Cerebral infarction, unspecified: Secondary | ICD-10-CM | POA: Diagnosis not present

## 2023-03-21 DIAGNOSIS — M1A9XX0 Chronic gout, unspecified, without tophus (tophi): Secondary | ICD-10-CM | POA: Diagnosis not present

## 2023-03-21 DIAGNOSIS — R41841 Cognitive communication deficit: Secondary | ICD-10-CM | POA: Diagnosis not present

## 2023-03-21 DIAGNOSIS — R2689 Other abnormalities of gait and mobility: Secondary | ICD-10-CM | POA: Diagnosis not present

## 2023-03-21 DIAGNOSIS — I69354 Hemiplegia and hemiparesis following cerebral infarction affecting left non-dominant side: Secondary | ICD-10-CM | POA: Diagnosis not present

## 2023-03-21 DIAGNOSIS — D696 Thrombocytopenia, unspecified: Secondary | ICD-10-CM | POA: Diagnosis not present

## 2023-03-21 DIAGNOSIS — G9341 Metabolic encephalopathy: Secondary | ICD-10-CM | POA: Diagnosis not present

## 2023-03-21 DIAGNOSIS — E119 Type 2 diabetes mellitus without complications: Secondary | ICD-10-CM | POA: Diagnosis not present

## 2023-03-21 DIAGNOSIS — E0590 Thyrotoxicosis, unspecified without thyrotoxic crisis or storm: Secondary | ICD-10-CM | POA: Diagnosis not present

## 2023-03-21 DIAGNOSIS — W19XXXA Unspecified fall, initial encounter: Secondary | ICD-10-CM | POA: Diagnosis not present

## 2023-03-21 DIAGNOSIS — D649 Anemia, unspecified: Secondary | ICD-10-CM | POA: Diagnosis not present

## 2023-03-21 DIAGNOSIS — R1312 Dysphagia, oropharyngeal phase: Secondary | ICD-10-CM | POA: Diagnosis not present

## 2023-03-21 DIAGNOSIS — F0280 Dementia in other diseases classified elsewhere without behavioral disturbance: Secondary | ICD-10-CM

## 2023-03-21 DIAGNOSIS — R351 Nocturia: Secondary | ICD-10-CM | POA: Diagnosis not present

## 2023-03-21 DIAGNOSIS — N401 Enlarged prostate with lower urinary tract symptoms: Secondary | ICD-10-CM | POA: Diagnosis not present

## 2023-03-21 DIAGNOSIS — F03B0 Unspecified dementia, moderate, without behavioral disturbance, psychotic disturbance, mood disturbance, and anxiety: Secondary | ICD-10-CM | POA: Diagnosis not present

## 2023-03-21 DIAGNOSIS — Z7401 Bed confinement status: Secondary | ICD-10-CM | POA: Diagnosis not present

## 2023-03-21 DIAGNOSIS — R531 Weakness: Secondary | ICD-10-CM | POA: Diagnosis not present

## 2023-03-21 DIAGNOSIS — I10 Essential (primary) hypertension: Secondary | ICD-10-CM | POA: Diagnosis not present

## 2023-03-21 DIAGNOSIS — Z87440 Personal history of urinary (tract) infections: Secondary | ICD-10-CM | POA: Diagnosis not present

## 2023-03-21 DIAGNOSIS — R739 Hyperglycemia, unspecified: Secondary | ICD-10-CM | POA: Diagnosis not present

## 2023-03-21 DIAGNOSIS — M6281 Muscle weakness (generalized): Secondary | ICD-10-CM | POA: Diagnosis not present

## 2023-03-21 DIAGNOSIS — R2681 Unsteadiness on feet: Secondary | ICD-10-CM | POA: Diagnosis not present

## 2023-03-21 DIAGNOSIS — R4182 Altered mental status, unspecified: Secondary | ICD-10-CM | POA: Diagnosis not present

## 2023-03-21 DIAGNOSIS — F0390 Unspecified dementia without behavioral disturbance: Secondary | ICD-10-CM | POA: Diagnosis not present

## 2023-03-21 LAB — CBC WITH DIFFERENTIAL/PLATELET
Abs Immature Granulocytes: 0.03 10*3/uL (ref 0.00–0.07)
Basophils Absolute: 0 10*3/uL (ref 0.0–0.1)
Basophils Relative: 1 %
Eosinophils Absolute: 0.1 10*3/uL (ref 0.0–0.5)
Eosinophils Relative: 2 %
HCT: 46 % (ref 39.0–52.0)
Hemoglobin: 15.8 g/dL (ref 13.0–17.0)
Immature Granulocytes: 1 %
Lymphocytes Relative: 25 %
Lymphs Abs: 1.5 10*3/uL (ref 0.7–4.0)
MCH: 31.2 pg (ref 26.0–34.0)
MCHC: 34.3 g/dL (ref 30.0–36.0)
MCV: 90.9 fL (ref 80.0–100.0)
Monocytes Absolute: 0.4 10*3/uL (ref 0.1–1.0)
Monocytes Relative: 7 %
Neutro Abs: 3.8 10*3/uL (ref 1.7–7.7)
Neutrophils Relative %: 64 %
Platelets: 146 10*3/uL — ABNORMAL LOW (ref 150–400)
RBC: 5.06 MIL/uL (ref 4.22–5.81)
RDW: 14.2 % (ref 11.5–15.5)
WBC: 5.9 10*3/uL (ref 4.0–10.5)
nRBC: 0 % (ref 0.0–0.2)

## 2023-03-21 LAB — GLUCOSE, CAPILLARY
Glucose-Capillary: 105 mg/dL — ABNORMAL HIGH (ref 70–99)
Glucose-Capillary: 108 mg/dL — ABNORMAL HIGH (ref 70–99)
Glucose-Capillary: 112 mg/dL — ABNORMAL HIGH (ref 70–99)
Glucose-Capillary: 143 mg/dL — ABNORMAL HIGH (ref 70–99)
Glucose-Capillary: 98 mg/dL (ref 70–99)

## 2023-03-21 LAB — BASIC METABOLIC PANEL
Anion gap: 8 (ref 5–15)
BUN: 18 mg/dL (ref 8–23)
CO2: 24 mmol/L (ref 22–32)
Calcium: 9.7 mg/dL (ref 8.9–10.3)
Chloride: 102 mmol/L (ref 98–111)
Creatinine, Ser: 0.96 mg/dL (ref 0.61–1.24)
GFR, Estimated: >60 mL/min (ref >60)
Glucose, Bld: 105 mg/dL — ABNORMAL HIGH (ref 70–99)
Potassium: 3.7 mmol/L (ref 3.5–5.1)
Sodium: 134 mmol/L — ABNORMAL LOW (ref 135–145)

## 2023-03-21 MED ORDER — ASPIRIN 81 MG PO CHEW: 81.0000 mg | CHEWABLE_TABLET | Freq: Every day | ORAL | Status: DC

## 2023-03-21 MED ORDER — AMLODIPINE BESYLATE 10 MG PO TABS: 10.0000 mg | ORAL_TABLET | Freq: Every day | ORAL | Status: DC

## 2023-03-21 MED ORDER — METOPROLOL SUCCINATE ER 25 MG PO TB24: 25.0000 mg | ORAL_TABLET | Freq: Every day | ORAL | Status: DC

## 2023-03-21 MED ORDER — ACETAMINOPHEN 325 MG PO TABS: 650.0000 mg | ORAL_TABLET | Freq: Four times a day (QID) | ORAL | Status: DC | PRN

## 2023-03-21 NOTE — Progress Notes (Addendum)
   03/21/23 0929  Mobility  Activity Ambulated with assistance in hallway;Dangled on edge of bed;Transferred from bed to chair  Level of Assistance Minimal assist, patient does 75% or more  Assistive Device Front wheel walker (with Chair Follow)  Distance Ambulated (ft) 200 ft  Activity Response Tolerated fair  Mobility Referral Yes  $Mobility charge 1 Mobility  Mobility Specialist Start Time (ACUTE ONLY) [id number]  Mobility Specialist Stop Time (ACUTE ONLY) 0925  Mobility Specialist Time Calculation (min) (ACUTE ONLY) 43 min   Mobility Specialist: Progress Note  Pre-Mobility: HR 70s During Mobility: HR 90-105 Post-Mobility: HR 60s  Pt agreeable to mobility session - received in bed. Required MinA throughout using RW with chair follow. Pt with BLE weakness and shakiness.  Returned to chair with all needs met - call bell within reach. Chair alarm on.   Barnie Mort, BS Mobility Specialist Please contact via SecureChat or Rehab office at [phone].

## 2023-03-21 NOTE — TOC Transition Note (Signed)
Transition of Care [facility]) - CM/SW Discharge Note Patient Details  Name: Dustin Hamilton MRN: [id number] Date of Birth: 1940-10-29  [facility]) CM/SW Contact:  Helene Kelp, LCSW Phone Number: 03/21/2023, 3:16 PM   Clinical Narrative:    CSW followed-up with the clinical team to address the patient's disposition (SNF transfer to accepted facility) .   After communicating with the clinical team (via chat), the CSW was able to follow-up the patient's disposition.   This Clinical research associate made contact with SNF facility representative [facility] liaison)  and obtained facility transfer information. Chartered loss adjuster) to coordinate patient discharge transfer to the facility.   CSW met with the patient at the bedside and updated them to their current disposition and coordination efforts.   CSW contacted the patient's natural support (wife-[name]) and updated them to the patient's current disposition and coordination efforts.  [name] noted she was ok with the patient's disposition and transfer to the noted facility.  CSW updated clinical team (via chat) regarding the information above and noted [facility] transport will be arranged.   This Clinical research associate followed-up the discharge process and arranged transportation Insurance risk surveyor) for the patient to the facility. CSW spoke with [facility] representative Administrator, arts) to facilitate transport:  Date: 03/21/23 Time:3:00 PM  CSW updated the clinical team to the above information, along with the patient and natural supports [name]).   Transfer process update: Clinical information faxed to facility to facilitate transfer to SNF Rehab facility.  CSW contacted pt family/natural supports to update pt's disposition (transfer to noted facility, accepting of patient's referral and transfer date and time). CSW arranged ambulance transport via [facility] to [facility]).   CSW provided RN with call report number [phone] Room: 905B) prior to discharge. CSW provied CNwith  discharge documentation needed for transfer at the nrusing station.  CSW reviewed all information above with the patient.   No other needs identified of this Clinical research associate. Please contact TOC if there is additional disposition support needed.   Final next level of care: Skilled Nursing Facility Barriers to Discharge: Continued Medical Work up   Patient Goals and CMS Choice CMS Medicare.gov Compare Post Acute Care list provided to:: Patient Represenative (must comment) Choice offered to / list presented to : Spouse  Discharge Placement  [facility]   Patient to be transferred to facility by: [facility] Name of family member notified: [name],[name] Patient and family notified of of transfer: 03/21/23  Discharge Plan and Services Additional resources added to the After Visit Summary for   In-house Referral: Clinical Social Work Discharge Planning Services: CM Consult Post Acute Care Choice: Skilled Nursing Facility           Social Determinants of Health (SDOH) Interventions SDOH Screenings   Food Insecurity: No Food Insecurity (03/15/2023)  Housing: Low Risk  (03/15/2023)  Transportation Needs: No Transportation Needs (03/15/2023)  Utilities: Not At Risk (03/15/2023)  Social Connections: Unknown (10/14/2021)   Received from [facility]  Tobacco Use: Medium Risk (02/08/2023)   Readmission Risk Interventions     No data to display

## 2023-03-21 NOTE — Progress Notes (Signed)
[facility] at [phone] and gave report to Juree, LPN who will be taking patient.  Patient going to room 905-B.

## 2023-03-21 NOTE — Plan of Care (Signed)
  Problem: Education: Goal: Understanding of CV disease, CV risk reduction, and recovery process will improve Outcome: Progressing Goal: Individualized Educational Video(s) Outcome: Progressing   Problem: Activity: Goal: Ability to return to baseline activity level will improve Outcome: Progressing   Problem: Cardiovascular: Goal: Ability to achieve and maintain adequate cardiovascular perfusion will improve Outcome: Progressing Goal: Vascular access site(s) Level 0-1 will be maintained Outcome: Progressing   Problem: Health Behavior/Discharge Planning: Goal: Ability to safely manage health-related needs after discharge will improve Outcome: Progressing   Problem: Education: Goal: Knowledge of condition and prescribed therapy will improve Outcome: Progressing   Problem: Cardiac: Goal: Will achieve and/or maintain adequate cardiac output Outcome: Progressing   Problem: Physical Regulation: Goal: Complications related to the disease process, condition or treatment will be avoided or minimized Outcome: Progressing   Problem: Education: Goal: Knowledge of General Education information will improve Description: Including pain rating scale, medication(s)/side effects and non-pharmacologic comfort measures Outcome: Progressing   Problem: Health Behavior/Discharge Planning: Goal: Ability to manage health-related needs will improve Outcome: Progressing   Problem: Clinical Measurements: Goal: Ability to maintain clinical measurements within normal limits will improve Outcome: Progressing Goal: Will remain free from infection Outcome: Progressing Goal: Diagnostic test results will improve Outcome: Progressing Goal: Respiratory complications will improve Outcome: Progressing Goal: Cardiovascular complication will be avoided Outcome: Progressing   Problem: Activity: Goal: Risk for activity intolerance will decrease Outcome: Progressing   Problem: Nutrition: Goal: Adequate  nutrition will be maintained Outcome: Progressing   Problem: Coping: Goal: Level of anxiety will decrease Outcome: Progressing   Problem: Elimination: Goal: Will not experience complications related to bowel motility Outcome: Progressing Goal: Will not experience complications related to urinary retention Outcome: Progressing   Problem: Pain Managment: Goal: General experience of comfort will improve Outcome: Progressing   Problem: Safety: Goal: Ability to remain free from injury will improve Outcome: Progressing   Problem: Skin Integrity: Goal: Risk for impaired skin integrity will decrease Outcome: Progressing   Problem: Education: Goal: Knowledge of disease or condition will improve Outcome: Progressing Goal: Knowledge of secondary prevention will improve (MUST DOCUMENT ALL) Outcome: Progressing Goal: Knowledge of patient specific risk factors will improve Loraine Leriche N/A or DELETE if not current risk factor) Outcome: Progressing   Problem: Ischemic Stroke/TIA Tissue Perfusion: Goal: Complications of ischemic stroke/TIA will be minimized Outcome: Progressing   Problem: Coping: Goal: Will verbalize positive feelings about self Outcome: Progressing Goal: Will identify appropriate support needs Outcome: Progressing   Problem: Health Behavior/Discharge Planning: Goal: Ability to manage health-related needs will improve Outcome: Progressing Goal: Goals will be collaboratively established with patient/family Outcome: Progressing   Problem: Self-Care: Goal: Ability to participate in self-care as condition permits will improve Outcome: Progressing Goal: Verbalization of feelings and concerns over difficulty with self-care will improve Outcome: Progressing Goal: Ability to communicate needs accurately will improve Outcome: Progressing   Problem: Nutrition: Goal: Risk of aspiration will decrease Outcome: Progressing Goal: Dietary intake will improve Outcome:  Progressing   Problem: Safety: Goal: Non-violent Restraint(s) Outcome: Progressing

## 2023-03-21 NOTE — Discharge Summary (Signed)
Surgeon: Marinus Maw, MD;  Location: [facility] INVASIVE CV LAB;  Service: Cardiovascular;  Laterality: N/A;  TRANSTHORACIC ECHOCARDIOGRAM  09/13/2012  moderate LVH,  ef 60-65%/    TRANSURETHRAL RESECTION OF BLADDER TUMOR N/A 08/11/2016  Procedure: TRANSURETHRAL RESECTION OF BLADDER TUMOR (TURBT);  Surgeon: Malen Gauze, MD;  Location: [facility];  Service: Urology;  Laterality: N/A; Gwynneth Aliment, M.A., CF-SLP Speech Language Pathology, Acute Rehabilitation Services Secure Chat preferred [phone]  03/17/2023, 2:06 PM  MR BRAIN WO CONTRAST  Result Date: 03/17/2023 CLINICAL DATA:  Neuro deficit, acute, stroke suspected EXAM: MRI HEAD WITHOUT CONTRAST TECHNIQUE: Multiplanar, multiecho pulse sequences of the brain and surrounding structures were obtained without intravenous contrast. 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 There is also an acute infarcts in the left region (series 5, image 75) there are innumerable supra and infratentorial microhemorrhages in a lobar distribution, in keeping with patient's history of amyloid angiopathy. There is sequela of severe chronic microvascular ischemic change. Ventriculomegaly, proportional to the degree of volume loss. No mass effect. No mass lesion. No extra-axial fluid collection. Vascular: Normal flow voids. Skull and upper cervical spine: Normal marrow signal. Sinuses/Orbits: No middle ear effusion. Small bilateral mastoid effusions. Mucosal thickening bilateral ethmoid sinuses. Bilateral lens replacement. Orbits are otherwise unremarkable. Other: None. IMPRESSION: 1. 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 2. 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 3. Innumerable supra and infratentorial microhemorrhages in a lobar distribution, in keeping with patient's history of amyloid angiopathy. 4. Sequela of severe chronic microvascular ischemic change. Electronically Signed   By: Lorenza Cambridge M.D.   On: 03/17/2023 13:02   ECHOCARDIOGRAM COMPLETE  Result Date: 03/15/2023    ECHOCARDIOGRAM REPORT   Patient Name:   Dustin Hamilton Date of Exam: 03/15/2023 Medical Rec #:  [id number]          Height:       71.0 in Accession #:    [id number]         Weight:       155.0 lb Date of Birth:  1941/03/29          BSA:          1.892 m Patient Age:    82 years           BP:            141/85 mmHg Patient Gender: M                  HR:           58 bpm. Exam Location:  Inpatient Procedure: 2D Echo, Color Doppler, Cardiac Doppler and Intracardiac            Opacification Agent Indications:    Syncope  History:        Patient has prior history of Echocardiogram examinations, most                 recent 09/04/2021. Pacemaker and AVNRT ablation, Arrythmias:CHB,                 SVT, Signs/Symptoms:Syncope; Risk Factors:Hypertension.  Sonographer:    Milbert Coulter Referring Phys: John Giovanni  Sonographer Comments: Technically difficult study due to poor echo windows, suboptimal parasternal window, no apical window and no subcostal window. Image acquisition challenging due to patient body habitus and Image acquisition challenging due to respiratory motion. IMPRESSIONS  Surgeon: Marinus Maw, MD;  Location: [facility] INVASIVE CV LAB;  Service: Cardiovascular;  Laterality: N/A;  TRANSTHORACIC ECHOCARDIOGRAM  09/13/2012  moderate LVH,  ef 60-65%/    TRANSURETHRAL RESECTION OF BLADDER TUMOR N/A 08/11/2016  Procedure: TRANSURETHRAL RESECTION OF BLADDER TUMOR (TURBT);  Surgeon: Malen Gauze, MD;  Location: [facility];  Service: Urology;  Laterality: N/A; Gwynneth Aliment, M.A., CF-SLP Speech Language Pathology, Acute Rehabilitation Services Secure Chat preferred [phone]  03/17/2023, 2:06 PM  MR BRAIN WO CONTRAST  Result Date: 03/17/2023 CLINICAL DATA:  Neuro deficit, acute, stroke suspected EXAM: MRI HEAD WITHOUT CONTRAST TECHNIQUE: Multiplanar, multiecho pulse sequences of the brain and surrounding structures were obtained without intravenous contrast. 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 There is also an acute infarcts in the left region (series 5, image 75) there are innumerable supra and infratentorial microhemorrhages in a lobar distribution, in keeping with patient's history of amyloid angiopathy. There is sequela of severe chronic microvascular ischemic change. Ventriculomegaly, proportional to the degree of volume loss. No mass effect. No mass lesion. No extra-axial fluid collection. Vascular: Normal flow voids. Skull and upper cervical spine: Normal marrow signal. Sinuses/Orbits: No middle ear effusion. Small bilateral mastoid effusions. Mucosal thickening bilateral ethmoid sinuses. Bilateral lens replacement. Orbits are otherwise unremarkable. Other: None. IMPRESSION: 1. 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 2. 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 3. Innumerable supra and infratentorial microhemorrhages in a lobar distribution, in keeping with patient's history of amyloid angiopathy. 4. Sequela of severe chronic microvascular ischemic change. Electronically Signed   By: Lorenza Cambridge M.D.   On: 03/17/2023 13:02   ECHOCARDIOGRAM COMPLETE  Result Date: 03/15/2023    ECHOCARDIOGRAM REPORT   Patient Name:   Dustin Hamilton Date of Exam: 03/15/2023 Medical Rec #:  [id number]          Height:       71.0 in Accession #:    [id number]         Weight:       155.0 lb Date of Birth:  1941/03/29          BSA:          1.892 m Patient Age:    82 years           BP:            141/85 mmHg Patient Gender: M                  HR:           58 bpm. Exam Location:  Inpatient Procedure: 2D Echo, Color Doppler, Cardiac Doppler and Intracardiac            Opacification Agent Indications:    Syncope  History:        Patient has prior history of Echocardiogram examinations, most                 recent 09/04/2021. Pacemaker and AVNRT ablation, Arrythmias:CHB,                 SVT, Signs/Symptoms:Syncope; Risk Factors:Hypertension.  Sonographer:    Milbert Coulter Referring Phys: John Giovanni  Sonographer Comments: Technically difficult study due to poor echo windows, suboptimal parasternal window, no apical window and no subcostal window. Image acquisition challenging due to patient body habitus and Image acquisition challenging due to respiratory motion. IMPRESSIONS  the intracranial carotid and vertebral arteries. Skull: Negative for fracture or focal lesion. Sinuses/Orbits: No acute finding. Other: None. CTA NECK FINDINGS Aortic arch: Standard branching. Imaged portion shows no evidence of aneurysm or dissection. No significant stenosis of the major arch vessel origins. Right carotid system: No evidence of dissection, occlusion, or hemodynamically significant stenosis (greater than 50%). Left carotid system: No evidence of dissection, occlusion, or hemodynamically significant stenosis (greater than 50%). Vertebral arteries: No evidence of dissection, occlusion, or hemodynamically significant stenosis (greater than 50%). Skeleton: No acute osseous abnormality. Degenerative changes in the cervical spine. Other neck: Redemonstrated diffusely enlarged thyroid, which appears similar to the 05/01/2022 chest CT. Upper chest: No focal pulmonary opacity or pleural effusion. Review of the MIP images confirms the above findings CTA HEAD FINDINGS Anterior circulation: Both internal carotid arteries are patent to the termini, without  significant stenosis. A1 segments patent. Normal anterior communicating artery. Anterior cerebral arteries are patent to their distal aspects without significant stenosis. No M1 stenosis or occlusion. MCA branches perfused to their distal aspects without significant stenosis. Posterior circulation: Vertebral arteries patent to the vertebrobasilar junction without significant stenosis. Posterior inferior cerebellar arteries patent proximally. Basilar patent to its distal aspect without significant stenosis. Superior cerebellar arteries patent proximally. Patent P1 segments. Occlusion of the proximal left P2 (series 11, images 106-111, with reconstitution in mid left P2; this is new compared to 2022. PCAs are otherwise perfused to their distal aspects without significant stenosis. The bilateral posterior communicating arteries are patent. Venous sinuses: As permitted by contrast timing, patent. Anatomic variants: None significant. No evidence of aneurysm or vascular malformation. Review of the MIP images confirms the above findings IMPRESSION: 1. Occlusion of the proximal left P2, with reconstitution in mid left P2, which is new from the 2022 CTA. 2. No other intracranial large vessel occlusion or significant stenosis. 3. No hemodynamically significant stenosis in the neck. 4. Loss of gray-white differentiation in the left occipital lobe, which correlates with the acute infarct seen on the 03/17/2023 MRI. Insert peer E: Port Electronically Signed   By: Wiliam Ke M.D.   On: 03/17/2023 23:47   DG Swallowing Func-Speech Pathology  Result Date: 03/17/2023 Table formatting from the original result was not included. Modified Barium Swallow Study Patient Details Name: LAVAN DISANTI MRN: [id number] Date of Birth: 11-22-1940 Today's Date: 03/17/2023 HPI/PMH: HPI: DONNIVIN CUPIT is an 82 yo male presenting to ED 10/12 with AMS and acute onset of aphasia. UA with possible UTI. CXR with severe chronic elevation of  R hemidiaphragm with increasing atelectasis. CTH negative. MRI Brain pending. MBS January 2024 with mild-moderate oropharyngeal dysphagia characterized by delayed swallow initiation, oropharyngeal residue resolved by cued multiple swallows, deep penetration of thin liquids. He was recommended to initiate a Dys 3 diet with nectar thick liquids at that time; however, pt's wife reports he does not like thickened liquids and is noncompliant with use. PMH includes cerebral amyloid angiopathy, L hippocampus/amygdala infarct, history of alcohol and IV drug use, history of ICH, dementia, HTN, recurrent C. Difficile, BPH Clinical Impression: Clinical Impression: Pt presents with a seemingly improved oropharyngeal swallow compared to previous MBS 06/11/22. He achieves good oral clearance and adequate BOT retraction with all consistencies administered. With solid consistencies, pt did have slow mastication. He continues to have trace and transient penetration with thin liquids (PAS 2), which does not appear to be as deep as noted previously. Nectar thick liquids via straw resulted in trace penetration that was unable to be cleared (  Surgeon: Marinus Maw, MD;  Location: [facility] INVASIVE CV LAB;  Service: Cardiovascular;  Laterality: N/A;  TRANSTHORACIC ECHOCARDIOGRAM  09/13/2012  moderate LVH,  ef 60-65%/    TRANSURETHRAL RESECTION OF BLADDER TUMOR N/A 08/11/2016  Procedure: TRANSURETHRAL RESECTION OF BLADDER TUMOR (TURBT);  Surgeon: Malen Gauze, MD;  Location: [facility];  Service: Urology;  Laterality: N/A; Gwynneth Aliment, M.A., CF-SLP Speech Language Pathology, Acute Rehabilitation Services Secure Chat preferred [phone]  03/17/2023, 2:06 PM  MR BRAIN WO CONTRAST  Result Date: 03/17/2023 CLINICAL DATA:  Neuro deficit, acute, stroke suspected EXAM: MRI HEAD WITHOUT CONTRAST TECHNIQUE: Multiplanar, multiecho pulse sequences of the brain and surrounding structures were obtained without intravenous contrast. 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 There is also an acute infarcts in the left region (series 5, image 75) there are innumerable supra and infratentorial microhemorrhages in a lobar distribution, in keeping with patient's history of amyloid angiopathy. There is sequela of severe chronic microvascular ischemic change. Ventriculomegaly, proportional to the degree of volume loss. No mass effect. No mass lesion. No extra-axial fluid collection. Vascular: Normal flow voids. Skull and upper cervical spine: Normal marrow signal. Sinuses/Orbits: No middle ear effusion. Small bilateral mastoid effusions. Mucosal thickening bilateral ethmoid sinuses. Bilateral lens replacement. Orbits are otherwise unremarkable. Other: None. IMPRESSION: 1. 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 2. 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 3. Innumerable supra and infratentorial microhemorrhages in a lobar distribution, in keeping with patient's history of amyloid angiopathy. 4. Sequela of severe chronic microvascular ischemic change. Electronically Signed   By: Lorenza Cambridge M.D.   On: 03/17/2023 13:02   ECHOCARDIOGRAM COMPLETE  Result Date: 03/15/2023    ECHOCARDIOGRAM REPORT   Patient Name:   Dustin Hamilton Date of Exam: 03/15/2023 Medical Rec #:  [id number]          Height:       71.0 in Accession #:    [id number]         Weight:       155.0 lb Date of Birth:  1941/03/29          BSA:          1.892 m Patient Age:    82 years           BP:            141/85 mmHg Patient Gender: M                  HR:           58 bpm. Exam Location:  Inpatient Procedure: 2D Echo, Color Doppler, Cardiac Doppler and Intracardiac            Opacification Agent Indications:    Syncope  History:        Patient has prior history of Echocardiogram examinations, most                 recent 09/04/2021. Pacemaker and AVNRT ablation, Arrythmias:CHB,                 SVT, Signs/Symptoms:Syncope; Risk Factors:Hypertension.  Sonographer:    Milbert Coulter Referring Phys: John Giovanni  Sonographer Comments: Technically difficult study due to poor echo windows, suboptimal parasternal window, no apical window and no subcostal window. Image acquisition challenging due to patient body habitus and Image acquisition challenging due to respiratory motion. IMPRESSIONS  Physician Discharge Summary  ZAION WEERS RUE:[id number] DOB: 1940/10/10 DOA: 03/14/2023  [facility]  Admit date: 03/14/2023 Discharge date: 03/21/2023  Admitted From: (Home) Disposition:  (snf)  Recommendations for Outpatient Follow-up:  Please obtain BMP/CBC in one week Ambulatory referral has been sent to neurology.   Diet recommendation: Heart Healthy   Brief/Interim Summary:  82 y.o. 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  He was completely unresponsive on arrival.  His wife told ED physician that she went to take a shower around 2:15-2:30 PM and at that time patient was at his baseline.  When she checked on the patient at 4:50 PM, he was found unresponsive/collapsed at the dining room table.  In the ER head CT nonacute, EEG unremarkable, MRI ordered seen by neurology and admitted to the hospital.  MRI significant for acute CVA, patient with known history of cerebral amyloidosis, so he was started only on aspirin given increased risk of bleeding and history of ICH.  Please see discussion below.       Acute CVA -MRI brain showing acute infarcts in bilateral PCA territory, left greater than right, involving bilateral occipital lobes, and acute infarct in the left hypothalamic region. -Neurology input greatly appreciated, patient is not on any antithrombotic prior to admission, now he is on aspirin 81 mg oral daily, due to to his history of ICH, with known background of CAA . -lDL acceptable at 67  -A1c acceptable at 5.8  -2D echo with EF 60 to 65%  -Recommendations for blood pressure control, SBP <140, medication has  been adjusted accordingly, please see discussion below -PT/OT is following.  Recommendation for SNF placement.   Altered mental status/unresponsiveness, metabolic encephalopathy   - Due to combination of UTI, dehydration, hypotension and mild hypoglycemia. -As well secondary to acute CVA. -As well he is not to have history of amyloidosis "spells" with underlying CAa with extensive workup in the past include neuroimaging, EEG   Hypertension -On Toprol-XL 12.5 mg at home, started on Norvasc as well, blood pressure remains elevated, recommendation for SBP < 140, so Toprol-XL increased to 25 mg, and has been started, and dose uptitrated to 10 mg oral daily.     Borderline hypoglycemia - Glucose now improved after oral intake.  Patient is not on any diabetes medications, last A1c 5.5 in April 2023.  Suspect hypoglycemia is due to poor p.o. intake.     UTI sent on admission.  -Treated with Rocephin,   Urine culture growing Staph epidermidis    BPH status post prostate artery embolization- Continue dutasteride.   Gout - Continue allopurinol.   Chronic mild thrombocytopenia - Platelet count stable and no signs of bleeding.   Dementia - Continue Namenda.   Glaucoma - Continue home eyedrops.      Discharge Diagnoses:  Principal Problem:   AMS (altered mental status) Active Problems:   Dementia (HCC)   Hypotension   Hypoglycemia   UTI (urinary tract infection)   Glaucoma   Gout    Discharge Instructions  Discharge Instructions     Ambulatory referral to Neurology   Complete by: As directed    Follow up with Dr. Marjory Lies at [facility] in 4-6 weeks.  Patient is Dr. Richrd Humbles patient. Thanks.  Physician Discharge Summary  ZAION WEERS RUE:[id number] DOB: 1940/10/10 DOA: 03/14/2023  [facility]P: [facility]  Admit date: 03/14/2023 Discharge date: 03/21/2023  Admitted From: (Home) Disposition:  (snf)  Recommendations for Outpatient Follow-up:  Please obtain BMP/CBC in one week Ambulatory referral has been sent to neurology.   Diet recommendation: Heart Healthy   Brief/Interim Summary:  82 y.o. 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  He was completely unresponsive on arrival.  His wife told ED physician that she went to take a shower around 2:15-2:30 PM and at that time patient was at his baseline.  When she checked on the patient at 4:50 PM, he was found unresponsive/collapsed at the dining room table.  In the ER head CT nonacute, EEG unremarkable, MRI ordered seen by neurology and admitted to the hospital.  MRI significant for acute CVA, patient with known history of cerebral amyloidosis, so he was started only on aspirin given increased risk of bleeding and history of ICH.  Please see discussion below.       Acute CVA -MRI brain showing acute infarcts in bilateral PCA territory, left greater than right, involving bilateral occipital lobes, and acute infarct in the left hypothalamic region. -Neurology input greatly appreciated, patient is not on any antithrombotic prior to admission, now he is on aspirin 81 mg oral daily, due to to his history of ICH, with known background of CAA . -lDL acceptable at 67  -A1c acceptable at 5.8  -2D echo with EF 60 to 65%  -Recommendations for blood pressure control, SBP <140, medication has  been adjusted accordingly, please see discussion below -PT/OT is following.  Recommendation for SNF placement.   Altered mental status/unresponsiveness, metabolic encephalopathy   - Due to combination of UTI, dehydration, hypotension and mild hypoglycemia. -As well secondary to acute CVA. -As well he is not to have history of amyloidosis "spells" with underlying CAa with extensive workup in the past include neuroimaging, EEG   Hypertension -On Toprol-XL 12.5 mg at home, started on Norvasc as well, blood pressure remains elevated, recommendation for SBP < 140, so Toprol-XL increased to 25 mg, and has been started, and dose uptitrated to 10 mg oral daily.     Borderline hypoglycemia - Glucose now improved after oral intake.  Patient is not on any diabetes medications, last A1c 5.5 in April 2023.  Suspect hypoglycemia is due to poor p.o. intake.     UTI sent on admission.  -Treated with Rocephin,   Urine culture growing Staph epidermidis    BPH status post prostate artery embolization- Continue dutasteride.   Gout - Continue allopurinol.   Chronic mild thrombocytopenia - Platelet count stable and no signs of bleeding.   Dementia - Continue Namenda.   Glaucoma - Continue home eyedrops.      Discharge Diagnoses:  Principal Problem:   AMS (altered mental status) Active Problems:   Dementia (HCC)   Hypotension   Hypoglycemia   UTI (urinary tract infection)   Glaucoma   Gout    Discharge Instructions  Discharge Instructions     Ambulatory referral to Neurology   Complete by: As directed    Follow up with Dr. Marjory Lies at [facility] in 4-6 weeks.  Patient is Dr. Richrd Humbles patient. Thanks.  Surgeon: Marinus Maw, MD;  Location: [facility] INVASIVE CV LAB;  Service: Cardiovascular;  Laterality: N/A;  TRANSTHORACIC ECHOCARDIOGRAM  09/13/2012  moderate LVH,  ef 60-65%/    TRANSURETHRAL RESECTION OF BLADDER TUMOR N/A 08/11/2016  Procedure: TRANSURETHRAL RESECTION OF BLADDER TUMOR (TURBT);  Surgeon: Malen Gauze, MD;  Location: [facility];  Service: Urology;  Laterality: N/A; Gwynneth Aliment, M.A., CF-SLP Speech Language Pathology, Acute Rehabilitation Services Secure Chat preferred [phone]  03/17/2023, 2:06 PM  MR BRAIN WO CONTRAST  Result Date: 03/17/2023 CLINICAL DATA:  Neuro deficit, acute, stroke suspected EXAM: MRI HEAD WITHOUT CONTRAST TECHNIQUE: Multiplanar, multiecho pulse sequences of the brain and surrounding structures were obtained without intravenous contrast. 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 There is also an acute infarcts in the left region (series 5, image 75) there are innumerable supra and infratentorial microhemorrhages in a lobar distribution, in keeping with patient's history of amyloid angiopathy. There is sequela of severe chronic microvascular ischemic change. Ventriculomegaly, proportional to the degree of volume loss. No mass effect. No mass lesion. No extra-axial fluid collection. Vascular: Normal flow voids. Skull and upper cervical spine: Normal marrow signal. Sinuses/Orbits: No middle ear effusion. Small bilateral mastoid effusions. Mucosal thickening bilateral ethmoid sinuses. Bilateral lens replacement. Orbits are otherwise unremarkable. Other: None. IMPRESSION: 1. 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 2. 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 3. Innumerable supra and infratentorial microhemorrhages in a lobar distribution, in keeping with patient's history of amyloid angiopathy. 4. Sequela of severe chronic microvascular ischemic change. Electronically Signed   By: Lorenza Cambridge M.D.   On: 03/17/2023 13:02   ECHOCARDIOGRAM COMPLETE  Result Date: 03/15/2023    ECHOCARDIOGRAM REPORT   Patient Name:   Dustin Hamilton Date of Exam: 03/15/2023 Medical Rec #:  [id number]          Height:       71.0 in Accession #:    [id number]         Weight:       155.0 lb Date of Birth:  1941/03/29          BSA:          1.892 m Patient Age:    82 years           BP:            141/85 mmHg Patient Gender: M                  HR:           58 bpm. Exam Location:  Inpatient Procedure: 2D Echo, Color Doppler, Cardiac Doppler and Intracardiac            Opacification Agent Indications:    Syncope  History:        Patient has prior history of Echocardiogram examinations, most                 recent 09/04/2021. Pacemaker and AVNRT ablation, Arrythmias:CHB,                 SVT, Signs/Symptoms:Syncope; Risk Factors:Hypertension.  Sonographer:    Milbert Coulter Referring Phys: John Giovanni  Sonographer Comments: Technically difficult study due to poor echo windows, suboptimal parasternal window, no apical window and no subcostal window. Image acquisition challenging due to patient body habitus and Image acquisition challenging due to respiratory motion. IMPRESSIONS  Surgeon: Marinus Maw, MD;  Location: [facility] INVASIVE CV LAB;  Service: Cardiovascular;  Laterality: N/A;  TRANSTHORACIC ECHOCARDIOGRAM  09/13/2012  moderate LVH,  ef 60-65%/    TRANSURETHRAL RESECTION OF BLADDER TUMOR N/A 08/11/2016  Procedure: TRANSURETHRAL RESECTION OF BLADDER TUMOR (TURBT);  Surgeon: Malen Gauze, MD;  Location: [facility];  Service: Urology;  Laterality: N/A; Gwynneth Aliment, M.A., CF-SLP Speech Language Pathology, Acute Rehabilitation Services Secure Chat preferred [phone]  03/17/2023, 2:06 PM  MR BRAIN WO CONTRAST  Result Date: 03/17/2023 CLINICAL DATA:  Neuro deficit, acute, stroke suspected EXAM: MRI HEAD WITHOUT CONTRAST TECHNIQUE: Multiplanar, multiecho pulse sequences of the brain and surrounding structures were obtained without intravenous contrast. 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 There is also an acute infarcts in the left region (series 5, image 75) there are innumerable supra and infratentorial microhemorrhages in a lobar distribution, in keeping with patient's history of amyloid angiopathy. There is sequela of severe chronic microvascular ischemic change. Ventriculomegaly, proportional to the degree of volume loss. No mass effect. No mass lesion. No extra-axial fluid collection. Vascular: Normal flow voids. Skull and upper cervical spine: Normal marrow signal. Sinuses/Orbits: No middle ear effusion. Small bilateral mastoid effusions. Mucosal thickening bilateral ethmoid sinuses. Bilateral lens replacement. Orbits are otherwise unremarkable. Other: None. IMPRESSION: 1. 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 2. 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 3. Innumerable supra and infratentorial microhemorrhages in a lobar distribution, in keeping with patient's history of amyloid angiopathy. 4. Sequela of severe chronic microvascular ischemic change. Electronically Signed   By: Lorenza Cambridge M.D.   On: 03/17/2023 13:02   ECHOCARDIOGRAM COMPLETE  Result Date: 03/15/2023    ECHOCARDIOGRAM REPORT   Patient Name:   Dustin Hamilton Date of Exam: 03/15/2023 Medical Rec #:  [id number]          Height:       71.0 in Accession #:    [id number]         Weight:       155.0 lb Date of Birth:  1941/03/29          BSA:          1.892 m Patient Age:    82 years           BP:            141/85 mmHg Patient Gender: M                  HR:           58 bpm. Exam Location:  Inpatient Procedure: 2D Echo, Color Doppler, Cardiac Doppler and Intracardiac            Opacification Agent Indications:    Syncope  History:        Patient has prior history of Echocardiogram examinations, most                 recent 09/04/2021. Pacemaker and AVNRT ablation, Arrythmias:CHB,                 SVT, Signs/Symptoms:Syncope; Risk Factors:Hypertension.  Sonographer:    Milbert Coulter Referring Phys: John Giovanni  Sonographer Comments: Technically difficult study due to poor echo windows, suboptimal parasternal window, no apical window and no subcostal window. Image acquisition challenging due to patient body habitus and Image acquisition challenging due to respiratory motion. IMPRESSIONS  Surgeon: Marinus Maw, MD;  Location: [facility] INVASIVE CV LAB;  Service: Cardiovascular;  Laterality: N/A;  TRANSTHORACIC ECHOCARDIOGRAM  09/13/2012  moderate LVH,  ef 60-65%/    TRANSURETHRAL RESECTION OF BLADDER TUMOR N/A 08/11/2016  Procedure: TRANSURETHRAL RESECTION OF BLADDER TUMOR (TURBT);  Surgeon: Malen Gauze, MD;  Location: [facility];  Service: Urology;  Laterality: N/A; Gwynneth Aliment, M.A., CF-SLP Speech Language Pathology, Acute Rehabilitation Services Secure Chat preferred [phone]  03/17/2023, 2:06 PM  MR BRAIN WO CONTRAST  Result Date: 03/17/2023 CLINICAL DATA:  Neuro deficit, acute, stroke suspected EXAM: MRI HEAD WITHOUT CONTRAST TECHNIQUE: Multiplanar, multiecho pulse sequences of the brain and surrounding structures were obtained without intravenous contrast. 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 There is also an acute infarcts in the left region (series 5, image 75) there are innumerable supra and infratentorial microhemorrhages in a lobar distribution, in keeping with patient's history of amyloid angiopathy. There is sequela of severe chronic microvascular ischemic change. Ventriculomegaly, proportional to the degree of volume loss. No mass effect. No mass lesion. No extra-axial fluid collection. Vascular: Normal flow voids. Skull and upper cervical spine: Normal marrow signal. Sinuses/Orbits: No middle ear effusion. Small bilateral mastoid effusions. Mucosal thickening bilateral ethmoid sinuses. Bilateral lens replacement. Orbits are otherwise unremarkable. Other: None. IMPRESSION: 1. 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 2. 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 3. Innumerable supra and infratentorial microhemorrhages in a lobar distribution, in keeping with patient's history of amyloid angiopathy. 4. Sequela of severe chronic microvascular ischemic change. Electronically Signed   By: Lorenza Cambridge M.D.   On: 03/17/2023 13:02   ECHOCARDIOGRAM COMPLETE  Result Date: 03/15/2023    ECHOCARDIOGRAM REPORT   Patient Name:   Dustin Hamilton Date of Exam: 03/15/2023 Medical Rec #:  [id number]          Height:       71.0 in Accession #:    [id number]         Weight:       155.0 lb Date of Birth:  1941/03/29          BSA:          1.892 m Patient Age:    82 years           BP:            141/85 mmHg Patient Gender: M                  HR:           58 bpm. Exam Location:  Inpatient Procedure: 2D Echo, Color Doppler, Cardiac Doppler and Intracardiac            Opacification Agent Indications:    Syncope  History:        Patient has prior history of Echocardiogram examinations, most                 recent 09/04/2021. Pacemaker and AVNRT ablation, Arrythmias:CHB,                 SVT, Signs/Symptoms:Syncope; Risk Factors:Hypertension.  Sonographer:    Milbert Coulter Referring Phys: John Giovanni  Sonographer Comments: Technically difficult study due to poor echo windows, suboptimal parasternal window, no apical window and no subcostal window. Image acquisition challenging due to patient body habitus and Image acquisition challenging due to respiratory motion. IMPRESSIONS  the intracranial carotid and vertebral arteries. Skull: Negative for fracture or focal lesion. Sinuses/Orbits: No acute finding. Other: None. CTA NECK FINDINGS Aortic arch: Standard branching. Imaged portion shows no evidence of aneurysm or dissection. No significant stenosis of the major arch vessel origins. Right carotid system: No evidence of dissection, occlusion, or hemodynamically significant stenosis (greater than 50%). Left carotid system: No evidence of dissection, occlusion, or hemodynamically significant stenosis (greater than 50%). Vertebral arteries: No evidence of dissection, occlusion, or hemodynamically significant stenosis (greater than 50%). Skeleton: No acute osseous abnormality. Degenerative changes in the cervical spine. Other neck: Redemonstrated diffusely enlarged thyroid, which appears similar to the 05/01/2022 chest CT. Upper chest: No focal pulmonary opacity or pleural effusion. Review of the MIP images confirms the above findings CTA HEAD FINDINGS Anterior circulation: Both internal carotid arteries are patent to the termini, without  significant stenosis. A1 segments patent. Normal anterior communicating artery. Anterior cerebral arteries are patent to their distal aspects without significant stenosis. No M1 stenosis or occlusion. MCA branches perfused to their distal aspects without significant stenosis. Posterior circulation: Vertebral arteries patent to the vertebrobasilar junction without significant stenosis. Posterior inferior cerebellar arteries patent proximally. Basilar patent to its distal aspect without significant stenosis. Superior cerebellar arteries patent proximally. Patent P1 segments. Occlusion of the proximal left P2 (series 11, images 106-111, with reconstitution in mid left P2; this is new compared to 2022. PCAs are otherwise perfused to their distal aspects without significant stenosis. The bilateral posterior communicating arteries are patent. Venous sinuses: As permitted by contrast timing, patent. Anatomic variants: None significant. No evidence of aneurysm or vascular malformation. Review of the MIP images confirms the above findings IMPRESSION: 1. Occlusion of the proximal left P2, with reconstitution in mid left P2, which is new from the 2022 CTA. 2. No other intracranial large vessel occlusion or significant stenosis. 3. No hemodynamically significant stenosis in the neck. 4. Loss of gray-white differentiation in the left occipital lobe, which correlates with the acute infarct seen on the 03/17/2023 MRI. Insert peer E: Port Electronically Signed   By: Wiliam Ke M.D.   On: 03/17/2023 23:47   DG Swallowing Func-Speech Pathology  Result Date: 03/17/2023 Table formatting from the original result was not included. Modified Barium Swallow Study Patient Details Name: LAVAN DISANTI MRN: [id number] Date of Birth: 11-22-1940 Today's Date: 03/17/2023 HPI/PMH: HPI: DONNIVIN CUPIT is an 82 yo male presenting to ED 10/12 with AMS and acute onset of aphasia. UA with possible UTI. CXR with severe chronic elevation of  R hemidiaphragm with increasing atelectasis. CTH negative. MRI Brain pending. MBS January 2024 with mild-moderate oropharyngeal dysphagia characterized by delayed swallow initiation, oropharyngeal residue resolved by cued multiple swallows, deep penetration of thin liquids. He was recommended to initiate a Dys 3 diet with nectar thick liquids at that time; however, pt's wife reports he does not like thickened liquids and is noncompliant with use. PMH includes cerebral amyloid angiopathy, L hippocampus/amygdala infarct, history of alcohol and IV drug use, history of ICH, dementia, HTN, recurrent C. Difficile, BPH Clinical Impression: Clinical Impression: Pt presents with a seemingly improved oropharyngeal swallow compared to previous MBS 06/11/22. He achieves good oral clearance and adequate BOT retraction with all consistencies administered. With solid consistencies, pt did have slow mastication. He continues to have trace and transient penetration with thin liquids (PAS 2), which does not appear to be as deep as noted previously. Nectar thick liquids via straw resulted in trace penetration that was unable to be cleared (  Surgeon: Marinus Maw, MD;  Location: [facility] INVASIVE CV LAB;  Service: Cardiovascular;  Laterality: N/A;  TRANSTHORACIC ECHOCARDIOGRAM  09/13/2012  moderate LVH,  ef 60-65%/    TRANSURETHRAL RESECTION OF BLADDER TUMOR N/A 08/11/2016  Procedure: TRANSURETHRAL RESECTION OF BLADDER TUMOR (TURBT);  Surgeon: Malen Gauze, MD;  Location: [facility];  Service: Urology;  Laterality: N/A; Gwynneth Aliment, M.A., CF-SLP Speech Language Pathology, Acute Rehabilitation Services Secure Chat preferred [phone]  03/17/2023, 2:06 PM  MR BRAIN WO CONTRAST  Result Date: 03/17/2023 CLINICAL DATA:  Neuro deficit, acute, stroke suspected EXAM: MRI HEAD WITHOUT CONTRAST TECHNIQUE: Multiplanar, multiecho pulse sequences of the brain and surrounding structures were obtained without intravenous contrast. 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 There is also an acute infarcts in the left region (series 5, image 75) there are innumerable supra and infratentorial microhemorrhages in a lobar distribution, in keeping with patient's history of amyloid angiopathy. There is sequela of severe chronic microvascular ischemic change. Ventriculomegaly, proportional to the degree of volume loss. No mass effect. No mass lesion. No extra-axial fluid collection. Vascular: Normal flow voids. Skull and upper cervical spine: Normal marrow signal. Sinuses/Orbits: No middle ear effusion. Small bilateral mastoid effusions. Mucosal thickening bilateral ethmoid sinuses. Bilateral lens replacement. Orbits are otherwise unremarkable. Other: None. IMPRESSION: 1. 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 2. 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 3. Innumerable supra and infratentorial microhemorrhages in a lobar distribution, in keeping with patient's history of amyloid angiopathy. 4. Sequela of severe chronic microvascular ischemic change. Electronically Signed   By: Lorenza Cambridge M.D.   On: 03/17/2023 13:02   ECHOCARDIOGRAM COMPLETE  Result Date: 03/15/2023    ECHOCARDIOGRAM REPORT   Patient Name:   Dustin Hamilton Date of Exam: 03/15/2023 Medical Rec #:  [id number]          Height:       71.0 in Accession #:    [id number]         Weight:       155.0 lb Date of Birth:  1941/03/29          BSA:          1.892 m Patient Age:    82 years           BP:            141/85 mmHg Patient Gender: M                  HR:           58 bpm. Exam Location:  Inpatient Procedure: 2D Echo, Color Doppler, Cardiac Doppler and Intracardiac            Opacification Agent Indications:    Syncope  History:        Patient has prior history of Echocardiogram examinations, most                 recent 09/04/2021. Pacemaker and AVNRT ablation, Arrythmias:CHB,                 SVT, Signs/Symptoms:Syncope; Risk Factors:Hypertension.  Sonographer:    Milbert Coulter Referring Phys: John Giovanni  Sonographer Comments: Technically difficult study due to poor echo windows, suboptimal parasternal window, no apical window and no subcostal window. Image acquisition challenging due to patient body habitus and Image acquisition challenging due to respiratory motion. IMPRESSIONS

## 2023-03-23 DIAGNOSIS — D696 Thrombocytopenia, unspecified: Secondary | ICD-10-CM | POA: Diagnosis not present

## 2023-03-23 DIAGNOSIS — I10 Essential (primary) hypertension: Secondary | ICD-10-CM | POA: Diagnosis not present

## 2023-03-23 DIAGNOSIS — R351 Nocturia: Secondary | ICD-10-CM | POA: Diagnosis not present

## 2023-03-23 DIAGNOSIS — I69354 Hemiplegia and hemiparesis following cerebral infarction affecting left non-dominant side: Secondary | ICD-10-CM | POA: Diagnosis not present

## 2023-03-23 DIAGNOSIS — M1A9XX0 Chronic gout, unspecified, without tophus (tophi): Secondary | ICD-10-CM | POA: Diagnosis not present

## 2023-03-23 DIAGNOSIS — F03B0 Unspecified dementia, moderate, without behavioral disturbance, psychotic disturbance, mood disturbance, and anxiety: Secondary | ICD-10-CM | POA: Diagnosis not present

## 2023-03-23 DIAGNOSIS — R739 Hyperglycemia, unspecified: Secondary | ICD-10-CM | POA: Diagnosis not present

## 2023-03-23 DIAGNOSIS — N401 Enlarged prostate with lower urinary tract symptoms: Secondary | ICD-10-CM | POA: Diagnosis not present

## 2023-03-23 DIAGNOSIS — G9341 Metabolic encephalopathy: Secondary | ICD-10-CM | POA: Diagnosis not present

## 2023-03-25 ENCOUNTER — Ambulatory Visit
Admission: RE | Admit: 2023-03-25 | Discharge: 2023-03-25 | Disposition: A | Discharge status: Home / Self Care | Payer: Medicare PPO | Source: Ambulatory Visit | Attending: Interventional Radiology

## 2023-03-25 DIAGNOSIS — N138 Other obstructive and reflux uropathy: Secondary | ICD-10-CM

## 2023-03-25 HISTORY — PX: IR RADIOLOGIST EVAL & MGMT: IMG5224

## 2023-03-25 NOTE — Progress Notes (Signed)
MG/ML SOLN COMPARISON:   04/01/2021 CTA head and neck, correlation is also made with 03/14/2023 CT head and 03/17/2023 MRI head FINDINGS: CT HEAD FINDINGS Brain: Loss of gray-white differentiation in the left occipital lobe, which correlates with the acute infarct seen on the 03/17/2023 MRI. No other area of hypodensity suggest additional acute infarct. No acute hemorrhage, mass, mass effect, or midline shift. No hydrocephalus or extra-axial collection. Unchanged ventriculomegaly. Periventricular white matter changes, likely the sequela of chronic small vessel ischemic disease. Vascular: No hyperdense vessel. Atherosclerotic calcifications in the intracranial carotid and vertebral arteries. Skull: Negative for fracture or focal lesion. Sinuses/Orbits: No acute finding. Other: None. CTA NECK FINDINGS Aortic arch: Standard branching. Imaged portion shows no evidence of aneurysm or dissection. No significant stenosis of the major arch vessel origins. Right carotid system: No evidence of dissection, occlusion, or hemodynamically significant stenosis (greater than 50%). Left carotid system: No evidence of dissection, occlusion, or hemodynamically significant stenosis (greater than 50%). Vertebral arteries: No evidence of dissection, occlusion, or hemodynamically significant stenosis (greater than 50%). Skeleton: No acute osseous abnormality. Degenerative changes in the cervical spine. Other neck: Redemonstrated diffusely enlarged thyroid, which appears similar to the 05/01/2022 chest CT. Upper chest: No focal pulmonary opacity or pleural effusion. Review of the MIP images confirms the above findings CTA HEAD FINDINGS Anterior circulation: Both internal carotid arteries are patent to the termini, without significant stenosis. A1 segments patent. Normal anterior communicating artery. Anterior cerebral arteries are patent to their distal aspects without significant stenosis. No M1 stenosis or occlusion. MCA branches perfused to their distal aspects  without significant stenosis. Posterior circulation: Vertebral arteries patent to the vertebrobasilar junction without significant stenosis. Posterior inferior cerebellar arteries patent proximally. Basilar patent to its distal aspect without significant stenosis. Superior cerebellar arteries patent proximally. Patent P1 segments. Occlusion of the proximal left P2 (series 11, images 106-111, with reconstitution in mid left P2; this is new compared to 2022. PCAs are otherwise perfused to their distal aspects without significant stenosis. The bilateral posterior communicating arteries are patent. Venous sinuses: As permitted by contrast timing, patent. Anatomic variants: None significant. No evidence of aneurysm or vascular malformation. Review of the MIP images confirms the above findings IMPRESSION: 1. Occlusion of the proximal left P2, with reconstitution in mid left P2, which is new from the 2022 CTA. 2. No other intracranial large vessel occlusion or significant stenosis. 3. No hemodynamically significant stenosis in the neck. 4. Loss of gray-white differentiation in the left occipital lobe, which correlates with the acute infarct seen on the 03/17/2023 MRI. Insert peer E: Port Electronically Signed   By: Wiliam Ke M.D.   On: 03/17/2023 23:47   DG Swallowing Func-Speech Pathology  Result Date: 03/17/2023 Table formatting from the original result was not included. Modified Barium Swallow Study Patient Details Name: Dustin Hamilton MRN: [id number] Date of Birth: 28-Apr-1941 Today's Date: 03/17/2023 HPI/PMH: HPI: Dustin Hamilton is an 82 yo male presenting to ED 10/12 with AMS and acute onset of aphasia. UA with possible UTI. CXR with severe chronic elevation of R hemidiaphragm with increasing atelectasis. CTH negative. MRI Brain pending. MBS January 2024 with mild-moderate oropharyngeal dysphagia characterized by delayed swallow initiation, oropharyngeal residue resolved by cued multiple swallows, deep  penetration of thin liquids. He was recommended to initiate a Dys 3 diet with nectar thick liquids at that time; however, pt's wife reports he does not like thickened liquids and is noncompliant with use. 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  Procedure: PACEMAKER IMPLANT;  Surgeon: Marinus Maw, MD;  Location: [facility];  Service: Cardiovascular;  Laterality: N/A;  SVT ABLATION N/A 11/15/2018  Procedure: SVT ABLATION;  Surgeon: Marinus Maw, MD;  Location: [facility];  Service: Cardiovascular;  Laterality: N/A;  TRANSTHORACIC ECHOCARDIOGRAM  09/13/2012  moderate LVH,  ef 60-65%/    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 There is also an acute infarcts in the left region (series 5, image 75) there are innumerable supra and infratentorial microhemorrhages in a lobar distribution, in keeping with patient's history of amyloid angiopathy. There is sequela of severe chronic microvascular ischemic change. Ventriculomegaly, proportional to the degree of volume loss. No mass effect. No mass lesion. No extra-axial fluid collection. Vascular: Normal flow voids. Skull and upper cervical spine: Normal marrow signal. Sinuses/Orbits: No middle ear effusion. Small bilateral mastoid effusions. Mucosal thickening bilateral ethmoid sinuses. Bilateral lens replacement. Orbits are otherwise unremarkable. Other: None. IMPRESSION: 1. 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 2. 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 3. Innumerable supra and infratentorial microhemorrhages in a lobar distribution, in keeping with patient's history of amyloid angiopathy. 4. Sequela of severe chronic microvascular ischemic change. Electronically Signed   By: Lorenza Cambridge M.D.   On: 03/17/2023 13:02   ECHOCARDIOGRAM COMPLETE  Result Date: 03/15/2023    ECHOCARDIOGRAM REPORT   Patient Name:   Dustin Hamilton Date of Exam: 03/15/2023 Medical Rec #:  [id number]          Height:       71.0 in Accession #:    [id number]         Weight:       155.0 lb Date of Birth:  06-17-1940          BSA:          1.892 m Patient Age:    82 years           BP:           141/85 mmHg Patient Gender: M                  HR:           58 bpm. Exam Location:  Inpatient Procedure: 2D Echo, Color Doppler, Cardiac Doppler and Intracardiac            Opacification Agent Indications:    Syncope  History:        Patient has  prior history of Echocardiogram examinations, most  INFARCT INC GUIDE ROADMAPPING  07/31/2022  IR RADIOLOGIST EVAL & MGMT  06/26/2022  IR RADIOLOGIST EVAL & MGMT  08/08/2022  IR RADIOLOGIST EVAL & MGMT  08/21/2022  IR US GUIDE VASC ACCESS RIGHT  07/31/2022  LAPAROSCOPIC INGUINAL HERNIA WITH UMBILICAL HERNIA Right 06/24/2007  LIVER BIOPSY  1980's and 2004  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  Procedure: PACEMAKER IMPLANT;  Surgeon: Marinus Maw, MD;  Location: [facility];  Service: Cardiovascular;  Laterality: N/A;  SVT ABLATION N/A 11/15/2018  Procedure: SVT ABLATION;  Surgeon: Marinus Maw, MD;  Location: [facility];  Service: Cardiovascular;  Laterality: N/A;  TRANSTHORACIC ECHOCARDIOGRAM  09/13/2012  moderate LVH,  ef 60-65%/    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 There is also an acute infarcts in the left region (series 5, image 75) there are innumerable supra and infratentorial microhemorrhages in a lobar distribution, in keeping with patient's history of amyloid angiopathy. There is sequela of severe chronic microvascular ischemic change. Ventriculomegaly, proportional to the degree of volume loss. No mass effect. No mass lesion. No extra-axial fluid collection. Vascular: Normal flow voids. Skull and upper cervical spine: Normal marrow signal. Sinuses/Orbits: No middle ear effusion. Small bilateral mastoid effusions. Mucosal thickening bilateral ethmoid sinuses. Bilateral lens replacement. Orbits are otherwise unremarkable. Other: None. IMPRESSION: 1. 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 2. 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 3. Innumerable supra and infratentorial microhemorrhages in a lobar distribution, in keeping with patient's history of amyloid angiopathy. 4. Sequela of severe chronic microvascular ischemic change. Electronically Signed   By: Lorenza Cambridge M.D.   On: 03/17/2023 13:02   ECHOCARDIOGRAM COMPLETE  Result Date: 03/15/2023    ECHOCARDIOGRAM REPORT   Patient Name:   Dustin Hamilton Date of Exam: 03/15/2023 Medical Rec #:  [id number]          Height:       71.0 in Accession #:    [id number]         Weight:       155.0 lb Date of Birth:  06-17-1940          BSA:          1.892 m Patient Age:    82 years           BP:           141/85 mmHg Patient Gender: M                  HR:           58 bpm. Exam Location:  Inpatient Procedure: 2D Echo, Color Doppler, Cardiac Doppler and Intracardiac            Opacification Agent Indications:    Syncope  History:        Patient has  prior history of Echocardiogram examinations, most  MG/ML SOLN COMPARISON:   04/01/2021 CTA head and neck, correlation is also made with 03/14/2023 CT head and 03/17/2023 MRI head FINDINGS: CT HEAD FINDINGS Brain: Loss of gray-white differentiation in the left occipital lobe, which correlates with the acute infarct seen on the 03/17/2023 MRI. No other area of hypodensity suggest additional acute infarct. No acute hemorrhage, mass, mass effect, or midline shift. No hydrocephalus or extra-axial collection. Unchanged ventriculomegaly. Periventricular white matter changes, likely the sequela of chronic small vessel ischemic disease. Vascular: No hyperdense vessel. Atherosclerotic calcifications in the intracranial carotid and vertebral arteries. Skull: Negative for fracture or focal lesion. Sinuses/Orbits: No acute finding. Other: None. CTA NECK FINDINGS Aortic arch: Standard branching. Imaged portion shows no evidence of aneurysm or dissection. No significant stenosis of the major arch vessel origins. Right carotid system: No evidence of dissection, occlusion, or hemodynamically significant stenosis (greater than 50%). Left carotid system: No evidence of dissection, occlusion, or hemodynamically significant stenosis (greater than 50%). Vertebral arteries: No evidence of dissection, occlusion, or hemodynamically significant stenosis (greater than 50%). Skeleton: No acute osseous abnormality. Degenerative changes in the cervical spine. Other neck: Redemonstrated diffusely enlarged thyroid, which appears similar to the 05/01/2022 chest CT. Upper chest: No focal pulmonary opacity or pleural effusion. Review of the MIP images confirms the above findings CTA HEAD FINDINGS Anterior circulation: Both internal carotid arteries are patent to the termini, without significant stenosis. A1 segments patent. Normal anterior communicating artery. Anterior cerebral arteries are patent to their distal aspects without significant stenosis. No M1 stenosis or occlusion. MCA branches perfused to their distal aspects  without significant stenosis. Posterior circulation: Vertebral arteries patent to the vertebrobasilar junction without significant stenosis. Posterior inferior cerebellar arteries patent proximally. Basilar patent to its distal aspect without significant stenosis. Superior cerebellar arteries patent proximally. Patent P1 segments. Occlusion of the proximal left P2 (series 11, images 106-111, with reconstitution in mid left P2; this is new compared to 2022. PCAs are otherwise perfused to their distal aspects without significant stenosis. The bilateral posterior communicating arteries are patent. Venous sinuses: As permitted by contrast timing, patent. Anatomic variants: None significant. No evidence of aneurysm or vascular malformation. Review of the MIP images confirms the above findings IMPRESSION: 1. Occlusion of the proximal left P2, with reconstitution in mid left P2, which is new from the 2022 CTA. 2. No other intracranial large vessel occlusion or significant stenosis. 3. No hemodynamically significant stenosis in the neck. 4. Loss of gray-white differentiation in the left occipital lobe, which correlates with the acute infarct seen on the 03/17/2023 MRI. Insert peer E: Port Electronically Signed   By: Wiliam Ke M.D.   On: 03/17/2023 23:47   DG Swallowing Func-Speech Pathology  Result Date: 03/17/2023 Table formatting from the original result was not included. Modified Barium Swallow Study Patient Details Name: Dustin Hamilton MRN: [id number] Date of Birth: 28-Apr-1941 Today's Date: 03/17/2023 HPI/PMH: HPI: Dustin Hamilton is an 82 yo male presenting to ED 10/12 with AMS and acute onset of aphasia. UA with possible UTI. CXR with severe chronic elevation of R hemidiaphragm with increasing atelectasis. CTH negative. MRI Brain pending. MBS January 2024 with mild-moderate oropharyngeal dysphagia characterized by delayed swallow initiation, oropharyngeal residue resolved by cued multiple swallows, deep  penetration of thin liquids. He was recommended to initiate a Dys 3 diet with nectar thick liquids at that time; however, pt's wife reports he does not like thickened liquids and is noncompliant with use. PMH includes cerebral amyloid angiopathy, L hippocampus/amygdala infarct, history  MG/ML SOLN COMPARISON:   04/01/2021 CTA head and neck, correlation is also made with 03/14/2023 CT head and 03/17/2023 MRI head FINDINGS: CT HEAD FINDINGS Brain: Loss of gray-white differentiation in the left occipital lobe, which correlates with the acute infarct seen on the 03/17/2023 MRI. No other area of hypodensity suggest additional acute infarct. No acute hemorrhage, mass, mass effect, or midline shift. No hydrocephalus or extra-axial collection. Unchanged ventriculomegaly. Periventricular white matter changes, likely the sequela of chronic small vessel ischemic disease. Vascular: No hyperdense vessel. Atherosclerotic calcifications in the intracranial carotid and vertebral arteries. Skull: Negative for fracture or focal lesion. Sinuses/Orbits: No acute finding. Other: None. CTA NECK FINDINGS Aortic arch: Standard branching. Imaged portion shows no evidence of aneurysm or dissection. No significant stenosis of the major arch vessel origins. Right carotid system: No evidence of dissection, occlusion, or hemodynamically significant stenosis (greater than 50%). Left carotid system: No evidence of dissection, occlusion, or hemodynamically significant stenosis (greater than 50%). Vertebral arteries: No evidence of dissection, occlusion, or hemodynamically significant stenosis (greater than 50%). Skeleton: No acute osseous abnormality. Degenerative changes in the cervical spine. Other neck: Redemonstrated diffusely enlarged thyroid, which appears similar to the 05/01/2022 chest CT. Upper chest: No focal pulmonary opacity or pleural effusion. Review of the MIP images confirms the above findings CTA HEAD FINDINGS Anterior circulation: Both internal carotid arteries are patent to the termini, without significant stenosis. A1 segments patent. Normal anterior communicating artery. Anterior cerebral arteries are patent to their distal aspects without significant stenosis. No M1 stenosis or occlusion. MCA branches perfused to their distal aspects  without significant stenosis. Posterior circulation: Vertebral arteries patent to the vertebrobasilar junction without significant stenosis. Posterior inferior cerebellar arteries patent proximally. Basilar patent to its distal aspect without significant stenosis. Superior cerebellar arteries patent proximally. Patent P1 segments. Occlusion of the proximal left P2 (series 11, images 106-111, with reconstitution in mid left P2; this is new compared to 2022. PCAs are otherwise perfused to their distal aspects without significant stenosis. The bilateral posterior communicating arteries are patent. Venous sinuses: As permitted by contrast timing, patent. Anatomic variants: None significant. No evidence of aneurysm or vascular malformation. Review of the MIP images confirms the above findings IMPRESSION: 1. Occlusion of the proximal left P2, with reconstitution in mid left P2, which is new from the 2022 CTA. 2. No other intracranial large vessel occlusion or significant stenosis. 3. No hemodynamically significant stenosis in the neck. 4. Loss of gray-white differentiation in the left occipital lobe, which correlates with the acute infarct seen on the 03/17/2023 MRI. Insert peer E: Port Electronically Signed   By: Wiliam Ke M.D.   On: 03/17/2023 23:47   DG Swallowing Func-Speech Pathology  Result Date: 03/17/2023 Table formatting from the original result was not included. Modified Barium Swallow Study Patient Details Name: Dustin Hamilton MRN: [id number] Date of Birth: 28-Apr-1941 Today's Date: 03/17/2023 HPI/PMH: HPI: Dustin Hamilton is an 82 yo male presenting to ED 10/12 with AMS and acute onset of aphasia. UA with possible UTI. CXR with severe chronic elevation of R hemidiaphragm with increasing atelectasis. CTH negative. MRI Brain pending. MBS January 2024 with mild-moderate oropharyngeal dysphagia characterized by delayed swallow initiation, oropharyngeal residue resolved by cued multiple swallows, deep  penetration of thin liquids. He was recommended to initiate a Dys 3 diet with nectar thick liquids at that time; however, pt's wife reports he does not like thickened liquids and is noncompliant with use. 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  Procedure: PACEMAKER IMPLANT;  Surgeon: Marinus Maw, MD;  Location: [facility];  Service: Cardiovascular;  Laterality: N/A;  SVT ABLATION N/A 11/15/2018  Procedure: SVT ABLATION;  Surgeon: Marinus Maw, MD;  Location: [facility];  Service: Cardiovascular;  Laterality: N/A;  TRANSTHORACIC ECHOCARDIOGRAM  09/13/2012  moderate LVH,  ef 60-65%/    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 There is also an acute infarcts in the left region (series 5, image 75) there are innumerable supra and infratentorial microhemorrhages in a lobar distribution, in keeping with patient's history of amyloid angiopathy. There is sequela of severe chronic microvascular ischemic change. Ventriculomegaly, proportional to the degree of volume loss. No mass effect. No mass lesion. No extra-axial fluid collection. Vascular: Normal flow voids. Skull and upper cervical spine: Normal marrow signal. Sinuses/Orbits: No middle ear effusion. Small bilateral mastoid effusions. Mucosal thickening bilateral ethmoid sinuses. Bilateral lens replacement. Orbits are otherwise unremarkable. Other: None. IMPRESSION: 1. 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 2. 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 3. Innumerable supra and infratentorial microhemorrhages in a lobar distribution, in keeping with patient's history of amyloid angiopathy. 4. Sequela of severe chronic microvascular ischemic change. Electronically Signed   By: Lorenza Cambridge M.D.   On: 03/17/2023 13:02   ECHOCARDIOGRAM COMPLETE  Result Date: 03/15/2023    ECHOCARDIOGRAM REPORT   Patient Name:   Dustin Hamilton Date of Exam: 03/15/2023 Medical Rec #:  [id number]          Height:       71.0 in Accession #:    [id number]         Weight:       155.0 lb Date of Birth:  06-17-1940          BSA:          1.892 m Patient Age:    82 years           BP:           141/85 mmHg Patient Gender: M                  HR:           58 bpm. Exam Location:  Inpatient Procedure: 2D Echo, Color Doppler, Cardiac Doppler and Intracardiac            Opacification Agent Indications:    Syncope  History:        Patient has  prior history of Echocardiogram examinations, most  INFARCT INC GUIDE ROADMAPPING  07/31/2022  IR RADIOLOGIST EVAL & MGMT  06/26/2022  IR RADIOLOGIST EVAL & MGMT  08/08/2022  IR RADIOLOGIST EVAL & MGMT  08/21/2022  IR US GUIDE VASC ACCESS RIGHT  07/31/2022  LAPAROSCOPIC INGUINAL HERNIA WITH UMBILICAL HERNIA Right 06/24/2007  LIVER BIOPSY  1980's and 2004  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  Procedure: PACEMAKER IMPLANT;  Surgeon: Marinus Maw, MD;  Location: [facility];  Service: Cardiovascular;  Laterality: N/A;  SVT ABLATION N/A 11/15/2018  Procedure: SVT ABLATION;  Surgeon: Marinus Maw, MD;  Location: [facility];  Service: Cardiovascular;  Laterality: N/A;  TRANSTHORACIC ECHOCARDIOGRAM  09/13/2012  moderate LVH,  ef 60-65%/    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 There is also an acute infarcts in the left region (series 5, image 75) there are innumerable supra and infratentorial microhemorrhages in a lobar distribution, in keeping with patient's history of amyloid angiopathy. There is sequela of severe chronic microvascular ischemic change. Ventriculomegaly, proportional to the degree of volume loss. No mass effect. No mass lesion. No extra-axial fluid collection. Vascular: Normal flow voids. Skull and upper cervical spine: Normal marrow signal. Sinuses/Orbits: No middle ear effusion. Small bilateral mastoid effusions. Mucosal thickening bilateral ethmoid sinuses. Bilateral lens replacement. Orbits are otherwise unremarkable. Other: None. IMPRESSION: 1. 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 2. 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 3. Innumerable supra and infratentorial microhemorrhages in a lobar distribution, in keeping with patient's history of amyloid angiopathy. 4. Sequela of severe chronic microvascular ischemic change. Electronically Signed   By: Lorenza Cambridge M.D.   On: 03/17/2023 13:02   ECHOCARDIOGRAM COMPLETE  Result Date: 03/15/2023    ECHOCARDIOGRAM REPORT   Patient Name:   Dustin Hamilton Date of Exam: 03/15/2023 Medical Rec #:  [id number]          Height:       71.0 in Accession #:    [id number]         Weight:       155.0 lb Date of Birth:  06-17-1940          BSA:          1.892 m Patient Age:    82 years           BP:           141/85 mmHg Patient Gender: M                  HR:           58 bpm. Exam Location:  Inpatient Procedure: 2D Echo, Color Doppler, Cardiac Doppler and Intracardiac            Opacification Agent Indications:    Syncope  History:        Patient has  prior history of Echocardiogram examinations, most  MG/ML SOLN COMPARISON:   04/01/2021 CTA head and neck, correlation is also made with 03/14/2023 CT head and 03/17/2023 MRI head FINDINGS: CT HEAD FINDINGS Brain: Loss of gray-white differentiation in the left occipital lobe, which correlates with the acute infarct seen on the 03/17/2023 MRI. No other area of hypodensity suggest additional acute infarct. No acute hemorrhage, mass, mass effect, or midline shift. No hydrocephalus or extra-axial collection. Unchanged ventriculomegaly. Periventricular white matter changes, likely the sequela of chronic small vessel ischemic disease. Vascular: No hyperdense vessel. Atherosclerotic calcifications in the intracranial carotid and vertebral arteries. Skull: Negative for fracture or focal lesion. Sinuses/Orbits: No acute finding. Other: None. CTA NECK FINDINGS Aortic arch: Standard branching. Imaged portion shows no evidence of aneurysm or dissection. No significant stenosis of the major arch vessel origins. Right carotid system: No evidence of dissection, occlusion, or hemodynamically significant stenosis (greater than 50%). Left carotid system: No evidence of dissection, occlusion, or hemodynamically significant stenosis (greater than 50%). Vertebral arteries: No evidence of dissection, occlusion, or hemodynamically significant stenosis (greater than 50%). Skeleton: No acute osseous abnormality. Degenerative changes in the cervical spine. Other neck: Redemonstrated diffusely enlarged thyroid, which appears similar to the 05/01/2022 chest CT. Upper chest: No focal pulmonary opacity or pleural effusion. Review of the MIP images confirms the above findings CTA HEAD FINDINGS Anterior circulation: Both internal carotid arteries are patent to the termini, without significant stenosis. A1 segments patent. Normal anterior communicating artery. Anterior cerebral arteries are patent to their distal aspects without significant stenosis. No M1 stenosis or occlusion. MCA branches perfused to their distal aspects  without significant stenosis. Posterior circulation: Vertebral arteries patent to the vertebrobasilar junction without significant stenosis. Posterior inferior cerebellar arteries patent proximally. Basilar patent to its distal aspect without significant stenosis. Superior cerebellar arteries patent proximally. Patent P1 segments. Occlusion of the proximal left P2 (series 11, images 106-111, with reconstitution in mid left P2; this is new compared to 2022. PCAs are otherwise perfused to their distal aspects without significant stenosis. The bilateral posterior communicating arteries are patent. Venous sinuses: As permitted by contrast timing, patent. Anatomic variants: None significant. No evidence of aneurysm or vascular malformation. Review of the MIP images confirms the above findings IMPRESSION: 1. Occlusion of the proximal left P2, with reconstitution in mid left P2, which is new from the 2022 CTA. 2. No other intracranial large vessel occlusion or significant stenosis. 3. No hemodynamically significant stenosis in the neck. 4. Loss of gray-white differentiation in the left occipital lobe, which correlates with the acute infarct seen on the 03/17/2023 MRI. Insert peer E: Port Electronically Signed   By: Wiliam Ke M.D.   On: 03/17/2023 23:47   DG Swallowing Func-Speech Pathology  Result Date: 03/17/2023 Table formatting from the original result was not included. Modified Barium Swallow Study Patient Details Name: Dustin Hamilton MRN: [id number] Date of Birth: 28-Apr-1941 Today's Date: 03/17/2023 HPI/PMH: HPI: Dustin Hamilton is an 82 yo male presenting to ED 10/12 with AMS and acute onset of aphasia. UA with possible UTI. CXR with severe chronic elevation of R hemidiaphragm with increasing atelectasis. CTH negative. MRI Brain pending. MBS January 2024 with mild-moderate oropharyngeal dysphagia characterized by delayed swallow initiation, oropharyngeal residue resolved by cued multiple swallows, deep  penetration of thin liquids. He was recommended to initiate a Dys 3 diet with nectar thick liquids at that time; however, pt's wife reports he does not like thickened liquids and is noncompliant with use. 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  Procedure: PACEMAKER IMPLANT;  Surgeon: Marinus Maw, MD;  Location: [facility];  Service: Cardiovascular;  Laterality: N/A;  SVT ABLATION N/A 11/15/2018  Procedure: SVT ABLATION;  Surgeon: Marinus Maw, MD;  Location: [facility];  Service: Cardiovascular;  Laterality: N/A;  TRANSTHORACIC ECHOCARDIOGRAM  09/13/2012  moderate LVH,  ef 60-65%/    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 There is also an acute infarcts in the left region (series 5, image 75) there are innumerable supra and infratentorial microhemorrhages in a lobar distribution, in keeping with patient's history of amyloid angiopathy. There is sequela of severe chronic microvascular ischemic change. Ventriculomegaly, proportional to the degree of volume loss. No mass effect. No mass lesion. No extra-axial fluid collection. Vascular: Normal flow voids. Skull and upper cervical spine: Normal marrow signal. Sinuses/Orbits: No middle ear effusion. Small bilateral mastoid effusions. Mucosal thickening bilateral ethmoid sinuses. Bilateral lens replacement. Orbits are otherwise unremarkable. Other: None. IMPRESSION: 1. 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 2. 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 3. Innumerable supra and infratentorial microhemorrhages in a lobar distribution, in keeping with patient's history of amyloid angiopathy. 4. Sequela of severe chronic microvascular ischemic change. Electronically Signed   By: Lorenza Cambridge M.D.   On: 03/17/2023 13:02   ECHOCARDIOGRAM COMPLETE  Result Date: 03/15/2023    ECHOCARDIOGRAM REPORT   Patient Name:   Dustin Hamilton Date of Exam: 03/15/2023 Medical Rec #:  [id number]          Height:       71.0 in Accession #:    [id number]         Weight:       155.0 lb Date of Birth:  06-17-1940          BSA:          1.892 m Patient Age:    82 years           BP:           141/85 mmHg Patient Gender: M                  HR:           58 bpm. Exam Location:  Inpatient Procedure: 2D Echo, Color Doppler, Cardiac Doppler and Intracardiac            Opacification Agent Indications:    Syncope  History:        Patient has  prior history of Echocardiogram examinations, most  MG/ML SOLN COMPARISON:   04/01/2021 CTA head and neck, correlation is also made with 03/14/2023 CT head and 03/17/2023 MRI head FINDINGS: CT HEAD FINDINGS Brain: Loss of gray-white differentiation in the left occipital lobe, which correlates with the acute infarct seen on the 03/17/2023 MRI. No other area of hypodensity suggest additional acute infarct. No acute hemorrhage, mass, mass effect, or midline shift. No hydrocephalus or extra-axial collection. Unchanged ventriculomegaly. Periventricular white matter changes, likely the sequela of chronic small vessel ischemic disease. Vascular: No hyperdense vessel. Atherosclerotic calcifications in the intracranial carotid and vertebral arteries. Skull: Negative for fracture or focal lesion. Sinuses/Orbits: No acute finding. Other: None. CTA NECK FINDINGS Aortic arch: Standard branching. Imaged portion shows no evidence of aneurysm or dissection. No significant stenosis of the major arch vessel origins. Right carotid system: No evidence of dissection, occlusion, or hemodynamically significant stenosis (greater than 50%). Left carotid system: No evidence of dissection, occlusion, or hemodynamically significant stenosis (greater than 50%). Vertebral arteries: No evidence of dissection, occlusion, or hemodynamically significant stenosis (greater than 50%). Skeleton: No acute osseous abnormality. Degenerative changes in the cervical spine. Other neck: Redemonstrated diffusely enlarged thyroid, which appears similar to the 05/01/2022 chest CT. Upper chest: No focal pulmonary opacity or pleural effusion. Review of the MIP images confirms the above findings CTA HEAD FINDINGS Anterior circulation: Both internal carotid arteries are patent to the termini, without significant stenosis. A1 segments patent. Normal anterior communicating artery. Anterior cerebral arteries are patent to their distal aspects without significant stenosis. No M1 stenosis or occlusion. MCA branches perfused to their distal aspects  without significant stenosis. Posterior circulation: Vertebral arteries patent to the vertebrobasilar junction without significant stenosis. Posterior inferior cerebellar arteries patent proximally. Basilar patent to its distal aspect without significant stenosis. Superior cerebellar arteries patent proximally. Patent P1 segments. Occlusion of the proximal left P2 (series 11, images 106-111, with reconstitution in mid left P2; this is new compared to 2022. PCAs are otherwise perfused to their distal aspects without significant stenosis. The bilateral posterior communicating arteries are patent. Venous sinuses: As permitted by contrast timing, patent. Anatomic variants: None significant. No evidence of aneurysm or vascular malformation. Review of the MIP images confirms the above findings IMPRESSION: 1. Occlusion of the proximal left P2, with reconstitution in mid left P2, which is new from the 2022 CTA. 2. No other intracranial large vessel occlusion or significant stenosis. 3. No hemodynamically significant stenosis in the neck. 4. Loss of gray-white differentiation in the left occipital lobe, which correlates with the acute infarct seen on the 03/17/2023 MRI. Insert peer E: Port Electronically Signed   By: Wiliam Ke M.D.   On: 03/17/2023 23:47   DG Swallowing Func-Speech Pathology  Result Date: 03/17/2023 Table formatting from the original result was not included. Modified Barium Swallow Study Patient Details Name: Dustin Hamilton MRN: [id number] Date of Birth: 28-Apr-1941 Today's Date: 03/17/2023 HPI/PMH: HPI: Dustin Hamilton is an 82 yo male presenting to ED 10/12 with AMS and acute onset of aphasia. UA with possible UTI. CXR with severe chronic elevation of R hemidiaphragm with increasing atelectasis. CTH negative. MRI Brain pending. MBS January 2024 with mild-moderate oropharyngeal dysphagia characterized by delayed swallow initiation, oropharyngeal residue resolved by cued multiple swallows, deep  penetration of thin liquids. He was recommended to initiate a Dys 3 diet with nectar thick liquids at that time; however, pt's wife reports he does not like thickened liquids and is noncompliant with use. PMH includes cerebral amyloid angiopathy, L hippocampus/amygdala infarct, history

## 2023-03-26 DIAGNOSIS — F0390 Unspecified dementia without behavioral disturbance: Secondary | ICD-10-CM | POA: Diagnosis not present

## 2023-03-26 DIAGNOSIS — I639 Cerebral infarction, unspecified: Secondary | ICD-10-CM | POA: Diagnosis not present

## 2023-03-26 DIAGNOSIS — R2681 Unsteadiness on feet: Secondary | ICD-10-CM | POA: Diagnosis not present

## 2023-03-26 DIAGNOSIS — I69354 Hemiplegia and hemiparesis following cerebral infarction affecting left non-dominant side: Secondary | ICD-10-CM | POA: Diagnosis not present

## 2023-03-26 DIAGNOSIS — Z9181 History of falling: Secondary | ICD-10-CM | POA: Diagnosis not present

## 2023-03-26 DIAGNOSIS — M6281 Muscle weakness (generalized): Secondary | ICD-10-CM | POA: Diagnosis not present

## 2023-03-26 DIAGNOSIS — W19XXXA Unspecified fall, initial encounter: Secondary | ICD-10-CM | POA: Diagnosis not present

## 2023-03-30 DIAGNOSIS — F0390 Unspecified dementia without behavioral disturbance: Secondary | ICD-10-CM | POA: Diagnosis not present

## 2023-03-30 DIAGNOSIS — R2681 Unsteadiness on feet: Secondary | ICD-10-CM | POA: Diagnosis not present

## 2023-03-30 DIAGNOSIS — I639 Cerebral infarction, unspecified: Secondary | ICD-10-CM | POA: Diagnosis not present

## 2023-03-30 DIAGNOSIS — M6281 Muscle weakness (generalized): Secondary | ICD-10-CM | POA: Diagnosis not present

## 2023-03-30 DIAGNOSIS — Z9181 History of falling: Secondary | ICD-10-CM | POA: Diagnosis not present

## 2023-04-01 DIAGNOSIS — R2681 Unsteadiness on feet: Secondary | ICD-10-CM | POA: Diagnosis not present

## 2023-04-01 DIAGNOSIS — F0390 Unspecified dementia without behavioral disturbance: Secondary | ICD-10-CM | POA: Diagnosis not present

## 2023-04-01 DIAGNOSIS — M6281 Muscle weakness (generalized): Secondary | ICD-10-CM | POA: Diagnosis not present

## 2023-04-01 DIAGNOSIS — I639 Cerebral infarction, unspecified: Secondary | ICD-10-CM | POA: Diagnosis not present

## 2023-04-01 DIAGNOSIS — Z9181 History of falling: Secondary | ICD-10-CM | POA: Diagnosis not present

## 2023-04-06 DIAGNOSIS — I69354 Hemiplegia and hemiparesis following cerebral infarction affecting left non-dominant side: Secondary | ICD-10-CM | POA: Diagnosis not present

## 2023-04-06 DIAGNOSIS — Z7189 Other specified counseling: Secondary | ICD-10-CM | POA: Diagnosis not present

## 2023-04-06 DIAGNOSIS — G9341 Metabolic encephalopathy: Secondary | ICD-10-CM | POA: Diagnosis not present

## 2023-04-06 DIAGNOSIS — I10 Essential (primary) hypertension: Secondary | ICD-10-CM | POA: Diagnosis not present

## 2023-04-06 DIAGNOSIS — Z87440 Personal history of urinary (tract) infections: Secondary | ICD-10-CM | POA: Diagnosis not present

## 2023-04-06 DIAGNOSIS — F03B0 Unspecified dementia, moderate, without behavioral disturbance, psychotic disturbance, mood disturbance, and anxiety: Secondary | ICD-10-CM | POA: Diagnosis not present

## 2023-04-10 ENCOUNTER — Ambulatory Visit (INDEPENDENT_AMBULATORY_CARE_PROVIDER_SITE_OTHER): Payer: Medicare PPO

## 2023-04-10 DIAGNOSIS — I442 Atrioventricular block, complete: Secondary | ICD-10-CM | POA: Diagnosis not present

## 2023-04-10 LAB — CUP PACEART REMOTE DEVICE CHECK
Battery Voltage: 75
Date Time Interrogation Session: 20241108095726
Implantable Lead Connection Status: 753985
Implantable Lead Connection Status: 753985
Implantable Lead Implant Date: 20211227
Implantable Lead Implant Date: 20211227
Implantable Lead Location: 753859
Implantable Lead Location: 753860
Implantable Lead Model: 377
Implantable Lead Model: 377
Implantable Lead Serial Number: 8000018960
Implantable Lead Serial Number: 8000042643
Implantable Pulse Generator Implant Date: 20211227
Pulse Gen Model: 407145
Pulse Gen Serial Number: 69915927

## 2023-04-21 NOTE — Progress Notes (Signed)
Remote pacemaker transmission.   

## 2023-04-24 ENCOUNTER — Encounter (HOSPITAL_COMMUNITY): Payer: Self-pay

## 2023-04-24 ENCOUNTER — Other Ambulatory Visit: Payer: Self-pay

## 2023-04-24 ENCOUNTER — Emergency Department (HOSPITAL_COMMUNITY)
Admission: EM | Admit: 2023-04-24 | Discharge: 2023-04-24 | Disposition: A | Discharge status: Home / Self Care | Payer: No Typology Code available for payment source | Attending: Emergency Medicine | Admitting: Emergency Medicine

## 2023-04-24 ENCOUNTER — Emergency Department (HOSPITAL_COMMUNITY): Payer: No Typology Code available for payment source

## 2023-04-24 DIAGNOSIS — K5900 Constipation, unspecified: Secondary | ICD-10-CM | POA: Diagnosis present

## 2023-04-24 DIAGNOSIS — R799 Abnormal finding of blood chemistry, unspecified: Secondary | ICD-10-CM | POA: Insufficient documentation

## 2023-04-24 DIAGNOSIS — R109 Unspecified abdominal pain: Secondary | ICD-10-CM | POA: Insufficient documentation

## 2023-04-24 DIAGNOSIS — K760 Fatty (change of) liver, not elsewhere classified: Secondary | ICD-10-CM | POA: Diagnosis not present

## 2023-04-24 DIAGNOSIS — N400 Enlarged prostate without lower urinary tract symptoms: Secondary | ICD-10-CM | POA: Diagnosis not present

## 2023-04-24 DIAGNOSIS — N3289 Other specified disorders of bladder: Secondary | ICD-10-CM | POA: Diagnosis not present

## 2023-04-24 DIAGNOSIS — Z7982 Long term (current) use of aspirin: Secondary | ICD-10-CM | POA: Insufficient documentation

## 2023-04-24 LAB — URINALYSIS, W/ REFLEX TO CULTURE (INFECTION SUSPECTED)
Bilirubin Urine: NEGATIVE
Glucose, UA: NEGATIVE mg/dL
Ketones, ur: NEGATIVE mg/dL
Nitrite: NEGATIVE
Protein, ur: 100 mg/dL — AB
Specific Gravity, Urine: 1.016 (ref 1.005–1.030)
pH: 5 (ref 5.0–8.0)

## 2023-04-24 LAB — CBC WITH DIFFERENTIAL/PLATELET
Abs Immature Granulocytes: 0.01 10*3/uL (ref 0.00–0.07)
Basophils Absolute: 0 10*3/uL (ref 0.0–0.1)
Basophils Relative: 0 %
Eosinophils Absolute: 0.1 10*3/uL (ref 0.0–0.5)
Eosinophils Relative: 1 %
HCT: 45.2 % (ref 39.0–52.0)
Hemoglobin: 15.3 g/dL (ref 13.0–17.0)
Immature Granulocytes: 0 %
Lymphocytes Relative: 28 %
Lymphs Abs: 2.1 10*3/uL (ref 0.7–4.0)
MCH: 32.4 pg (ref 26.0–34.0)
MCHC: 33.8 g/dL (ref 30.0–36.0)
MCV: 95.8 fL (ref 80.0–100.0)
Monocytes Absolute: 0.5 10*3/uL (ref 0.1–1.0)
Monocytes Relative: 7 %
Neutro Abs: 4.6 10*3/uL (ref 1.7–7.7)
Neutrophils Relative %: 64 %
Platelets: 179 10*3/uL (ref 150–400)
RBC: 4.72 MIL/uL (ref 4.22–5.81)
RDW: 14.6 % (ref 11.5–15.5)
WBC: 7.3 10*3/uL (ref 4.0–10.5)
nRBC: 0 % (ref 0.0–0.2)

## 2023-04-24 LAB — COMPREHENSIVE METABOLIC PANEL
ALT: 25 U/L (ref 0–44)
AST: 25 U/L (ref 15–41)
Albumin: 4 g/dL (ref 3.5–5.0)
Alkaline Phosphatase: 72 U/L (ref 38–126)
Anion gap: 12 (ref 5–15)
BUN: 20 mg/dL (ref 8–23)
CO2: 25 mmol/L (ref 22–32)
Calcium: 9.7 mg/dL (ref 8.9–10.3)
Chloride: 106 mmol/L (ref 98–111)
Creatinine, Ser: 1.09 mg/dL (ref 0.61–1.24)
GFR, Estimated: >60 mL/min (ref >60)
Glucose, Bld: 113 mg/dL — ABNORMAL HIGH (ref 70–99)
Potassium: 3.4 mmol/L — ABNORMAL LOW (ref 3.5–5.1)
Sodium: 143 mmol/L (ref 135–145)
Total Bilirubin: 0.7 mg/dL (ref <1.2)
Total Protein: 7.9 g/dL (ref 6.5–8.1)

## 2023-04-24 LAB — CBG MONITORING, ED: Glucose-Capillary: 109 mg/dL — ABNORMAL HIGH (ref 70–99)

## 2023-04-24 LAB — LIPASE, BLOOD: Lipase: 29 U/L (ref 11–51)

## 2023-04-24 MED ORDER — DOCUSATE SODIUM 100 MG PO CAPS: 100.0000 mg | ORAL_CAPSULE | Freq: Two times a day (BID) | ORAL | 0 refills | Status: DC

## 2023-04-24 MED ORDER — POLYETHYLENE GLYCOL 3350 17 G PO PACK: 17.0000 g | PACK | Freq: Every day | ORAL | 0 refills | Status: DC

## 2023-04-24 MED ORDER — SODIUM CHLORIDE 0.9 % IV BOLUS
500.0000 mL | Freq: Once | INTRAVENOUS | Status: AC
  Administered 2023-04-24: 500 mL via INTRAVENOUS

## 2023-04-24 MED ORDER — ONDANSETRON HCL 4 MG/2ML IJ SOLN
4.0000 mg | Freq: Once | INTRAMUSCULAR | Status: AC
  Administered 2023-04-24: 4 mg via INTRAVENOUS
  Filled 2023-04-24: qty 2

## 2023-04-24 MED ORDER — IOHEXOL 300 MG/ML  SOLN
100.0000 mL | Freq: Once | INTRAMUSCULAR | Status: AC | PRN
  Administered 2023-04-24: 100 mL via INTRAVENOUS

## 2023-04-24 MED ORDER — MORPHINE SULFATE (PF) 4 MG/ML IV SOLN
4.0000 mg | Freq: Once | INTRAVENOUS | Status: AC
  Administered 2023-04-24: 4 mg via INTRAVENOUS
  Filled 2023-04-24: qty 1

## 2023-04-24 MED ORDER — FLEET ENEMA RE ENEM
1.0000 | ENEMA | Freq: Once | RECTAL | Status: AC
  Administered 2023-04-24: 1 via RECTAL
  Filled 2023-04-24: qty 1

## 2023-04-24 NOTE — ED Provider Notes (Signed)
[place] EMERGENCY DEPARTMENT AT [facility] Provider Note   CSN: [id number] Arrival date & time: 04/24/23  1518     History {Add pertinent medical, surgical, social history, OB history to HPI:1} Chief Complaint  Patient presents with   Constipation   Abdominal Pain    Dustin Hamilton is a 82 y.o. male.  82 year old male with prior medical history as detailed below presents for evaluation.  Patient complains of significant constipation x 3 to 4 days.  Patient complains of significant rectal pain associated with attempted bowel movement.  Patient came to the ED for evaluation.  Apparently, while patient was about to be triage he needs to have a BM here in the ED.  He became unresponsive while on the toilet and straining.  At the time of my evaluation the patient is comfortable.  He denies any associated symptoms other than rectal pain.  The history is provided by the patient and medical records.  Abdominal Pain      Home Medications Prior to Admission medications   Medication Sig Start Date End Date Taking? Authorizing Provider  acetaminophen (TYLENOL) 325 MG tablet Take 2 tablets (650 mg total) by mouth every 6 (six) hours as needed for mild pain (pain score 1-3) (or Fever >/= 101). 03/21/23  Yes Elgergawy, Leana Roe, MD  allopurinol (ZYLOPRIM) 100 MG tablet Take 1 tablet (100 mg total) by mouth daily. 04/05/21 03/14/24 Yes Pokhrel, Laxman, MD  amLODipine (NORVASC) 10 MG tablet Take 1 tablet (10 mg total) by mouth daily. 03/22/23  Yes Elgergawy, Leana Roe, MD  aspirin 81 MG chewable tablet Chew 1 tablet (81 mg total) by mouth daily. 03/22/23  Yes Elgergawy, Leana Roe, MD  brimonidine (ALPHAGAN) 0.2 % ophthalmic solution Place 1 drop into both eyes 2 (two) times daily. 10/19/22  Yes Lyndle Herrlich, MD  dorzolamide (TRUSOPT) 2 % ophthalmic solution Place 1 drop into both eyes 2 (two) times daily.   Yes [provider]  dutasteride (AVODART) 0.5 MG  capsule Take 0.5 mg by mouth in the morning.   Yes [provider]  latanoprost (XALATAN) 0.005 % ophthalmic solution Place 1 drop into both eyes at bedtime.    Yes [provider]  memantine (NAMENDA) 10 MG tablet Take 10 mg by mouth 2 (two) times daily.   Yes [provider]  metoprolol succinate (TOPROL-XL) 25 MG 24 hr tablet Take 1 tablet (25 mg total) by mouth daily. 03/22/23  Yes Elgergawy, Leana Roe, MD  Nutritional Supplements (ENSURE ACTIVE HEART HEALTH PO) Take 237 mLs by mouth See admin instructions. Drink 237 ml's by mouth two times a day- Clear/apple flavor   Yes [provider]      Allergies    Aricept [donepezil], Exelon [rivastigmine], Levetiracetam, Penicillins, and Viagra [sildenafil]    Review of Systems   Review of Systems  All other systems reviewed and are negative.   Physical Exam Updated Vital Signs BP (!) 137/111   Pulse 73   Resp 19   Wt 70 kg   SpO2 98%   BMI 21.52 kg/m  Physical Exam Vitals and nursing note reviewed.  Constitutional:      General: He is not in acute distress.    Appearance: Normal appearance. He is well-developed.  HENT:     Head: Normocephalic and atraumatic.  Eyes:     Conjunctiva/sclera: Conjunctivae normal.     Pupils: Pupils are equal, round, and reactive to light.  Cardiovascular:     Rate and  Rhythm: Normal rate and regular rhythm.     Heart sounds: Normal heart sounds.  Pulmonary:     Effort: Pulmonary effort is normal. No respiratory distress.     Breath sounds: Normal breath sounds.  Abdominal:     General: There is no distension.     Palpations: Abdomen is soft.     Tenderness: There is no abdominal tenderness.  Musculoskeletal:        General: No deformity. Normal range of motion.     Cervical back: Normal range of motion and neck supple.  Skin:    General: Skin is warm and dry.  Neurological:     General: No focal deficit present.     Mental Status: He is alert and oriented  to person, place, and time.     ED Results / Procedures / Treatments   Labs (all labs ordered are listed, but only abnormal results are displayed) Labs Reviewed  COMPREHENSIVE METABOLIC PANEL - Abnormal; Notable for the following components:      Result Value   Potassium 3.4 (*)    Glucose, Bld 113 (*)    All other components within normal limits  URINALYSIS, W/ REFLEX TO CULTURE (INFECTION SUSPECTED) - Abnormal; Notable for the following components:   Hgb urine dipstick SMALL (*)    Protein, ur 100 (*)    Leukocytes,Ua MODERATE (*)    Bacteria, UA RARE (*)    All other components within normal limits  CBG MONITORING, ED - Abnormal; Notable for the following components:   Glucose-Capillary 109 (*)    All other components within normal limits  URINE CULTURE  CBC WITH DIFFERENTIAL/PLATELET  LIPASE, BLOOD    EKG EKG Interpretation Date/Time:  Friday April 24 2023 15:49:55 EST Ventricular Rate:  71 PR Interval:  363 QRS Duration:  127 QT Interval:  401 QTC Calculation: 436 R Axis:   -82  Text Interpretation: Sinus rhythm Prolonged PR interval Nonspecific IVCD with LAD Probable anteroseptal infarct, old Borderline T abnormalities, inferior leads Confirmed by Kristine Royal [phone]) on 04/24/2023 4:17:50 PM  Radiology No results found.  Procedures Procedures  {Document cardiac monitor, telemetry assessment procedure when appropriate:1}  Medications Ordered in ED Medications  sodium chloride 0.9 % bolus 500 mL (500 mLs Intravenous New Bag/Given 04/24/23 1638)  ondansetron (ZOFRAN) injection 4 mg (4 mg Intravenous Given 04/24/23 1637)  morphine (PF) 4 MG/ML injection 4 mg (4 mg Intravenous Given 04/24/23 1638)  iohexol (OMNIPAQUE) 300 MG/ML solution 100 mL (100 mLs Intravenous Contrast Given 04/24/23 1715)    ED Course/ Medical Decision Making/ A&P   {   Click here for ABCD2, HEART and other calculatorsREFRESH Note before signing :1}                               Medical Decision Making Amount and/or Complexity of Data Reviewed Labs: ordered. Radiology: ordered.  Risk OTC drugs. Prescription drug management.    Medical Screen Complete  This patient presented to the ED with complaint of ***.  This complaint involves an extensive number of treatment options. The initial differential diagnosis includes, but is not limited to, ***  This presentation is: {IllnessRisk:19196::"***","Acute","Chronic","Self-Limited","Previously Undiagnosed","Uncertain Prognosis","Complicated","Systemic Symptoms","Threat to Life/Bodily Function"}    Co morbidities that complicated the patient's evaluation  ***   Additional history obtained:  Additional history obtained from {History source:19196::"EMS","Spouse","Family","Friend","Caregiver"} External records from outside sources obtained and reviewed including prior ED visits and prior Inpatient records.  Lab Tests:  I ordered and personally interpreted labs.  The pertinent results include:  ***   Imaging Studies ordered:  I ordered imaging studies including ***  I independently visualized and interpreted obtained imaging which showed *** I agree with the radiologist interpretation.   Cardiac Monitoring:  The patient was maintained on a cardiac monitor.  I personally viewed and interpreted the cardiac monitor which showed an underlying rhythm of: ***   Medicines ordered:  I ordered medication including ***  for ***  Reevaluation of the patient after these medicines showed that the patient: {resolved/improved/worsened:23923::"improved"}    Test Considered:  ***   Critical Interventions:  ***   Consultations Obtained:  I consulted ***,  and discussed lab and imaging findings as well as pertinent plan of care.    Problem List / ED Course:  ***   Reevaluation:  After the interventions noted above, I reevaluated the patient and found that they have:  {resolved/improved/worsened:23923::"improved"}   Social Determinants of Health:  ***   Disposition:  After consideration of the diagnostic results and the patients response to treatment, I feel that the patent would benefit from ***.    {Document critical care time when appropriate:1} {Document review of labs and clinical decision tools ie heart score, Chads2Vasc2 etc:1}  {Document your independent review of radiology images, and any outside records:1} {Document your discussion with family members, caretakers, and with consultants:1} {Document social determinants of health affecting pt's care:1} {Document your decision making why or why not admission, treatments were needed:1} Final Clinical Impression(s) / ED Diagnoses Final diagnoses:  None    Rx / DC Orders ED Discharge Orders     None

## 2023-04-24 NOTE — ED Triage Notes (Addendum)
Family reports patient being constipated and c/o rectal pressure.  Unk LBM Pt sat on toilet to have a BM and went unresponsive, clammy, and diaphoretic.  Patient responsive to painful stimuli in triage.

## 2023-04-25 ENCOUNTER — Encounter (HOSPITAL_COMMUNITY): Payer: Self-pay | Admitting: *Deleted

## 2023-04-25 ENCOUNTER — Emergency Department (HOSPITAL_COMMUNITY)
Admission: EM | Admit: 2023-04-25 | Discharge: 2023-04-25 | Disposition: A | Discharge status: Home / Self Care | Payer: No Typology Code available for payment source | Attending: Emergency Medicine | Admitting: Emergency Medicine

## 2023-04-25 ENCOUNTER — Other Ambulatory Visit: Payer: Self-pay

## 2023-04-25 DIAGNOSIS — K5641 Fecal impaction: Secondary | ICD-10-CM | POA: Diagnosis not present

## 2023-04-25 DIAGNOSIS — Z7982 Long term (current) use of aspirin: Secondary | ICD-10-CM | POA: Insufficient documentation

## 2023-04-25 DIAGNOSIS — K5900 Constipation, unspecified: Secondary | ICD-10-CM | POA: Diagnosis present

## 2023-04-25 LAB — CBC
HCT: 43.5 % (ref 39.0–52.0)
Hemoglobin: 14.6 g/dL (ref 13.0–17.0)
MCH: 32.1 pg (ref 26.0–34.0)
MCHC: 33.6 g/dL (ref 30.0–36.0)
MCV: 95.6 fL (ref 80.0–100.0)
Platelets: 171 10*3/uL (ref 150–400)
RBC: 4.55 MIL/uL (ref 4.22–5.81)
RDW: 14.8 % (ref 11.5–15.5)
WBC: 7.5 10*3/uL (ref 4.0–10.5)
nRBC: 0 % (ref 0.0–0.2)

## 2023-04-25 LAB — COMPREHENSIVE METABOLIC PANEL
ALT: 25 U/L (ref 0–44)
AST: 26 U/L (ref 15–41)
Albumin: 3.6 g/dL (ref 3.5–5.0)
Alkaline Phosphatase: 66 U/L (ref 38–126)
Anion gap: 7 (ref 5–15)
BUN: 18 mg/dL (ref 8–23)
CO2: 25 mmol/L (ref 22–32)
Calcium: 9.7 mg/dL (ref 8.9–10.3)
Chloride: 108 mmol/L (ref 98–111)
Creatinine, Ser: 1.2 mg/dL (ref 0.61–1.24)
GFR, Estimated: >60 mL/min (ref >60)
Glucose, Bld: 111 mg/dL — ABNORMAL HIGH (ref 70–99)
Potassium: 3.7 mmol/L (ref 3.5–5.1)
Sodium: 140 mmol/L (ref 135–145)
Total Bilirubin: 0.4 mg/dL (ref <1.2)
Total Protein: 7.4 g/dL (ref 6.5–8.1)

## 2023-04-25 LAB — LIPASE, BLOOD: Lipase: 25 U/L (ref 11–51)

## 2023-04-25 MED ORDER — LIDOCAINE HCL URETHRAL/MUCOSAL 2 % EX GEL
1.0000 | Freq: Once | CUTANEOUS | Status: AC
  Administered 2023-04-25: 1 via TOPICAL
  Filled 2023-04-25: qty 11

## 2023-04-25 MED ORDER — FLEET ENEMA RE ENEM
1.0000 | ENEMA | Freq: Once | RECTAL | Status: AC
  Administered 2023-04-25: 1 via RECTAL
  Filled 2023-04-25: qty 1

## 2023-04-25 NOTE — ED Notes (Signed)
Patient verbalizes understanding of discharge instructions. Opportunity for questioning and answers were provided. Armband removed by staff, pt discharged from ED. Wheeled out to lobby with wife

## 2023-04-25 NOTE — Discharge Instructions (Signed)
You have stool impaction.  I recommend taking MiraLAX twice daily for a week  Stay hydrated  See your doctor for follow-up  Return to ER if you have no bowel movements for a week or severe abdominal pain

## 2023-04-25 NOTE — ED Triage Notes (Signed)
No bm for 5 days he was seen at [facility] long ed yesterdaY he has not had any releif much pain

## 2023-04-25 NOTE — ED Provider Notes (Signed)
[facility] EMERGENCY DEPARTMENT AT [facility] Provider Note   CSN: [id number] Arrival date & time: 04/25/23  2003     History  Chief Complaint  Patient presents with   Constipation    Dustin Hamilton is a 82 y.o. male here with constipation.  Patient has been constipated for the last 2 weeks.  Patient was seen yesterday in the ER and had a CT abdomen pelvis that showed constipation and unremarkable labs.  Patient had a enema and digital disimpaction but still had minimal bowel movement.  He feels constant rectal pressure.  His wife tried to disimpact him with no success.  The history is provided by the patient.       Home Medications Prior to Admission medications   Medication Sig Start Date End Date Taking? Authorizing Provider  acetaminophen (TYLENOL) 325 MG tablet Take 2 tablets (650 mg total) by mouth every 6 (six) hours as needed for mild pain (pain score 1-3) (or Fever >/= 101). 03/21/23   Elgergawy, Leana Roe, MD  allopurinol (ZYLOPRIM) 100 MG tablet Take 1 tablet (100 mg total) by mouth daily. 04/05/21 03/14/24  Pokhrel, Rebekah Chesterfield, MD  amLODipine (NORVASC) 10 MG tablet Take 1 tablet (10 mg total) by mouth daily. 03/22/23   Elgergawy, Leana Roe, MD  aspirin 81 MG chewable tablet Chew 1 tablet (81 mg total) by mouth daily. 03/22/23   Elgergawy, Leana Roe, MD  brimonidine (ALPHAGAN) 0.2 % ophthalmic solution Place 1 drop into both eyes 2 (two) times daily. 10/19/22   Lyndle Herrlich, MD  docusate sodium (COLACE) 100 MG capsule Take 1 capsule (100 mg total) by mouth every 12 (twelve) hours. 04/24/23   Wynetta Fines, MD  dorzolamide (TRUSOPT) 2 % ophthalmic solution Place 1 drop into both eyes 2 (two) times daily.    [provider]  dutasteride (AVODART) 0.5 MG capsule Take 0.5 mg by mouth in the morning.    [provider]  latanoprost (XALATAN) 0.005 % ophthalmic solution Place 1 drop into both eyes at bedtime.     [provider]   memantine (NAMENDA) 10 MG tablet Take 10 mg by mouth 2 (two) times daily.    [provider]  metoprolol succinate (TOPROL-XL) 25 MG 24 hr tablet Take 1 tablet (25 mg total) by mouth daily. 03/22/23   Elgergawy, Leana Roe, MD  Nutritional Supplements (ENSURE ACTIVE HEART HEALTH PO) Take 237 mLs by mouth See admin instructions. Drink 237 ml's by mouth two times a day- Clear/apple flavor    [provider]  polyethylene glycol (MIRALAX) 17 g packet Take 17 g by mouth daily. 04/24/23   Wynetta Fines, MD      Allergies    Aricept [donepezil], Exelon [rivastigmine], Levetiracetam, Penicillins, and Viagra [sildenafil]    Review of Systems   Review of Systems  Gastrointestinal:  Positive for constipation.  All other systems reviewed and are negative.   Physical Exam Updated Vital Signs BP (!) (P) 140/78 (BP Location: Right Arm)   Pulse (P) 80   Temp 98.5 F (36.9 C) (Oral)   Resp (P) 20   Ht 5\' 11"  (1.803 m)   Wt 70 kg   SpO2 (P) 100%   BMI 21.52 kg/m  Physical Exam Vitals and nursing note reviewed.  Constitutional:      Appearance: Normal appearance.  HENT:     Head: Normocephalic.     Nose: Nose normal.     Mouth/Throat:     Mouth: Mucous membranes  are moist.  Eyes:     Extraocular Movements: Extraocular movements intact.     Pupils: Pupils are equal, round, and reactive to light.  Cardiovascular:     Rate and Rhythm: Normal rate and regular rhythm.     Pulses: Normal pulses.  Pulmonary:     Effort: Pulmonary effort is normal.     Breath sounds: Normal breath sounds.  Abdominal:     General: Abdomen is flat.     Palpations: Abdomen is soft.  Genitourinary:    Comments: Rectal-stool impaction Musculoskeletal:        General: Normal range of motion.     Cervical back: Normal range of motion and neck supple.  Skin:    General: Skin is warm.     Capillary Refill: Capillary refill takes less than 2 seconds.  Neurological:     General: No focal  deficit present.     Mental Status: He is alert and oriented to person, place, and time.  Psychiatric:        Mood and Affect: Mood normal.        Behavior: Behavior normal.     ED Results / Procedures / Treatments   Labs (all labs ordered are listed, but only abnormal results are displayed) Labs Reviewed  COMPREHENSIVE METABOLIC PANEL - Abnormal; Notable for the following components:      Result Value   Glucose, Bld 111 (*)    All other components within normal limits  LIPASE, BLOOD  CBC  URINALYSIS, ROUTINE W REFLEX MICROSCOPIC    EKG None  Radiology CT ABDOMEN PELVIS W CONTRAST  Result Date: 04/24/2023 CLINICAL DATA:  Abdominal pain, acute, nonlocalized EXAM: CT ABDOMEN AND PELVIS WITH CONTRAST TECHNIQUE: Multidetector CT imaging of the abdomen and pelvis was performed using the standard protocol following bolus administration of intravenous contrast. RADIATION DOSE REDUCTION: This exam was performed according to the departmental dose-optimization program which includes automated exposure control, adjustment of the mA and/or kV according to patient size and/or use of iterative reconstruction technique. CONTRAST:  OMNIPAQUE IOHEXOL 300 MG/ML  SOLN COMPARISON:  CT scan abdomen and pelvis from 08/25/2021. FINDINGS: Lower chest: There are patchy atelectatic changes in the visualized lung bases. No overt consolidation. No pleural effusion. The heart is normal in size. No pericardial effusion. There are coronary artery atherosclerotic calcifications, in keeping with coronary artery disease. Partially seen pacemaker wires. Bilateral mild symmetric gynecomastia noted. Hepatobiliary: The liver is normal in size. Non-cirrhotic configuration. No suspicious mass. These is mild diffuse hepatic steatosis. No intrahepatic or extrahepatic bile duct dilation. No calcified gallstones. Normal gallbladder wall thickness. No pericholecystic inflammatory changes. Pancreas: Unremarkable. No pancreatic  ductal dilatation or surrounding inflammatory changes. Spleen: Within normal limits. No focal lesion. Adrenals/Urinary Tract: There is indeterminate but stable 1.1 x 1.2 cm right adrenal nodule. Unremarkable left adrenal gland. No suspicious renal mass. There are multiple bilateral simple cysts with largest arising from the left kidney lower pole measuring up to 5.2 x 5.5 cm. No hydronephrosis. No renal or ureteric calculi. Urinary bladder is under distended, precluding optimal assessment. However, no large mass or stones identified. No perivesical fat stranding. There is small amount of air in the urinary bladder, likely secondary to instrumentation. Correlate clinically. Stomach/Bowel: Mild asymmetric thickening of the anterior wall of the midbody region is of indeterminate etiology but similar to the prior study. No disproportionate dilation of the small or large bowel loops. No evidence of abnormal bowel wall thickening or inflammatory changes. The appendix is  unremarkable. Vascular/Lymphatic: No ascites or pneumoperitoneum. No abdominal or pelvic lymphadenopathy, by size criteria. No aneurysmal dilation of the major abdominal arteries. There are mild peripheral atherosclerotic vascular calcifications of the aorta and its major branches. Reproductive: Markedly enlarged prostate gland protruding into the bladder base. Correlation with serum PSA is recommended. Symmetric seminal vesicles. Other: The visualized soft tissues and abdominal wall are unremarkable. Musculoskeletal: No suspicious osseous lesions. There are mild - moderate multilevel degenerative changes in the visualized spine. IMPRESSION: *No acute inflammatory process identified within the abdomen or pelvis. *Multiple other nonacute observations (such as stable indeterminate right adrenal nodule, multiple bilateral renal cysts, markedly enlarged prostate gland, stable asymmetric thickening of the anterior wall of the stomach, etc.), As described above.  Electronically Signed   By: Jules Schick M.D.   On: 04/24/2023 18:04    Procedures Fecal disimpaction  Date/Time: 04/25/2023 10:23 PM  Performed by: Charlynne Pander, MD Authorized by: Charlynne Pander, MD  Consent: Verbal consent obtained. Risks and benefits: risks, benefits and alternatives were discussed Consent given by: patient Patient understanding: patient states understanding of the procedure being performed Required items: required blood products, implants, devices, and special equipment available  Sedation: Patient sedated: no  Patient tolerance: patient tolerated the procedure well with no immediate complications       Medications Ordered in ED Medications  sodium phosphate (FLEET) enema 1 enema (1 enema Rectal Given 04/25/23 2113)  lidocaine (XYLOCAINE) 2 % jelly 1 Application (1 Application Topical Given by Other 04/25/23 2113)    ED Course/ Medical Decision Making/ A&P                                 Medical Decision Making Dustin Hamilton is a 82 y.o. male here with stool impaction.  I was able to perform disimpaction and gave him an enema after passing a 18 French Foley catheter.  Patient had a large bowel movement afterwards.  I reviewed his labs and they were unremarkable.  patient stable for discharge   Problems Addressed: Impacted stool in intestine [facility]): acute illness or injury  Amount and/or Complexity of Data Reviewed Labs: ordered. Decision-making details documented in ED Course.  Risk OTC drugs.    Final Clinical Impression(s) / ED Diagnoses Final diagnoses:  None    Rx / DC Orders ED Discharge Orders     None         Charlynne Pander, MD 04/25/23 2224

## 2023-04-25 NOTE — ED Notes (Signed)
Pt unable to urinate at this time.

## 2023-04-27 ENCOUNTER — Encounter: Payer: Self-pay | Admitting: Professional Counselor

## 2023-04-27 ENCOUNTER — Other Ambulatory Visit (HOSPITAL_COMMUNITY): Payer: Self-pay | Admitting: Professional Counselor

## 2023-04-27 DIAGNOSIS — M545 Low back pain, unspecified: Secondary | ICD-10-CM

## 2023-04-27 LAB — URINE CULTURE: Culture: NO GROWTH

## 2023-04-28 ENCOUNTER — Other Ambulatory Visit (HOSPITAL_COMMUNITY): Payer: Self-pay | Admitting: Internal Medicine

## 2023-04-28 ENCOUNTER — Other Ambulatory Visit: Payer: Self-pay | Admitting: Internal Medicine

## 2023-04-28 ENCOUNTER — Inpatient Hospital Stay: Payer: Medicare PPO | Admitting: Diagnostic Neuroimaging

## 2023-04-28 DIAGNOSIS — M545 Low back pain, unspecified: Secondary | ICD-10-CM

## 2023-05-06 ENCOUNTER — Ambulatory Visit: Payer: Medicare PPO | Admitting: Diagnostic Neuroimaging

## 2023-05-06 ENCOUNTER — Encounter: Payer: Self-pay | Admitting: Diagnostic Neuroimaging

## 2023-05-06 VITALS — BP 109/63 | HR 60 | Ht 71.0 in | Wt 164.0 lb

## 2023-05-06 DIAGNOSIS — I680 Cerebral amyloid angiopathy: Secondary | ICD-10-CM

## 2023-05-06 DIAGNOSIS — I63333 Cerebral infarction due to thrombosis of bilateral posterior cerebral arteries: Secondary | ICD-10-CM | POA: Diagnosis not present

## 2023-05-06 DIAGNOSIS — F02B0 Dementia in other diseases classified elsewhere, moderate, without behavioral disturbance, psychotic disturbance, mood disturbance, and anxiety: Secondary | ICD-10-CM | POA: Diagnosis not present

## 2023-05-06 DIAGNOSIS — E854 Organ-limited amyloidosis: Secondary | ICD-10-CM | POA: Diagnosis not present

## 2023-05-06 DIAGNOSIS — G301 Alzheimer's disease with late onset: Secondary | ICD-10-CM | POA: Diagnosis not present

## 2023-05-06 NOTE — Progress Notes (Signed)
[facility]  PATIENT: Dustin Hamilton DOB: July 24, 1940  REFERRING CLINICIAN: Marvel Plan, MD  HISTORY FROM: patient and wife  REASON FOR VISIT: FOLLOW UP    HISTORICAL  CHIEF COMPLAINT:  Chief Complaint  Patient presents with   Follow-up    Patient in room #6 with his wife. Patient states he is well and stable, but happy his out the rehab and walking much better. Patient wife states he's walking, talking and eating better.    HISTORY OF PRESENT ILLNESS:   UPDATE (05/06/23, VRP): Since last visit, WAS IN HOSPITAL in Oct 2024 for new onset loss of consciousness. Taken to ER, and noted to be aphasic and confused and staring. Diagnosed with new left PCA infarct. Discharged to rehab 16 days, then back home.   UPDATE (08/14/20, VRP): Since last visit, had VEEG in Dec 2021, and multiple spells were captured, without EEG correlation. 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 Having 2-3 spells per day. No major changes in BP.    UPDATE (05/14/20, VRP): Since last visit, admitted for syncope (Dec 2021). EEG showed some slowing, but no epileptiform discharges. Has continued to have mild dizzy spells without LOC. During our evaluation patient had a witnessed event; he said "I feel it coming on", then he leaned over, said "oh no; oh no", grabbed his walker handled, breathed heavily for 1-2 minutes, could squeeze my hand on command, but not could not say his name initially, but then could speak later on. Patient was amnestic to the spell. This is similar to events witnessed by wife at home (~3-4 per month; zone out; not able to speak).   UPDATE (03/14/19, VRP): Since last visit, doing about the same. Symptoms are stable. Severity is moderate. No alleviating or aggravating factors. Tolerating meds. Memory stable. Balance is poor.   UPDATE (09/06/18, VRP): Since last visit, doing well until 1 weeks ago; patient woke up with new onset of  slurred speech, left arm weakness, left arm numbness, trouble walking.  Wife called EMS for evaluation.  By the time they arrived symptoms were starting to improve.  He was able to stand and walk.  His blood pressure was 150s over 80s.  He did not have any fever.  Due to current virus pandemic, mild symptoms, rapidly improving symptoms, apparently EMS told patient that it would be safer if he stays at home, although I cannot verify this information.  Since that time symptoms have significantly improved.  His slurred speech has improved.  He still has weakness and numbness of his left arm.  His balance is unsteady but slightly improving.  He is tolerating his medications.  Memory stable.  PRIOR HPI 82 year old male here for evaluation of memory loss.  Patient has had at least 3 to 4 years of gradual onset progressive short-term memory loss, confusion, cognitive decline.  He previously diagnosed with vascular dementia.  Past 2 years patient has had intermittent staring spells, zoning out spells, 3-4 times per month.  Sometimes associated with lightheadedness, dizziness and presyncope symptoms.  One time patient went to the hospital for evaluation and was found to have hypotension.  His blood pressure medicines have been adjusted and blood pressure has been improved since August 2019 and patient has not had any of these staring spells since that time.   REVIEW OF SYSTEMS: Full 14 system review of systems performed and negative with exception of: as per HPI.    ALLERGIES: Allergies  Allergen Reactions  Aricept [Donepezil] Other (See Comments)    Dizziness    Exelon [Rivastigmine] Other (See Comments)    Malaise    Levetiracetam Other (See Comments)    Altered mental status   Penicillins Hives   Viagra [Sildenafil] Palpitations and Other (See Comments)    Dizziness, also     HOME MEDICATIONS: Outpatient Medications Prior to Visit  Medication Sig Dispense Refill   acetaminophen (TYLENOL)  325 MG tablet Take 2 tablets (650 mg total) by mouth every 6 (six) hours as needed for mild pain (pain score 1-3) (or Fever >/= 101).     allopurinol (ZYLOPRIM) 100 MG tablet Take 1 tablet (100 mg total) by mouth daily. 30 tablet 2   amLODipine (NORVASC) 10 MG tablet Take 1 tablet (10 mg total) by mouth daily.     aspirin 81 MG chewable tablet Chew 1 tablet (81 mg total) by mouth daily.     brimonidine (ALPHAGAN) 0.2 % ophthalmic solution Place 1 drop into both eyes 2 (two) times daily. 5 mL 12   docusate sodium (COLACE) 100 MG capsule Take 1 capsule (100 mg total) by mouth every 12 (twelve) hours. 60 capsule 0   dorzolamide (TRUSOPT) 2 % ophthalmic solution Place 1 drop into both eyes 2 (two) times daily.     dutasteride (AVODART) 0.5 MG capsule Take 0.5 mg by mouth in the morning.     latanoprost (XALATAN) 0.005 % ophthalmic solution Place 1 drop into both eyes at bedtime.      memantine (NAMENDA) 10 MG tablet Take 10 mg by mouth 2 (two) times daily.     metoprolol succinate (TOPROL-XL) 25 MG 24 hr tablet Take 1 tablet (25 mg total) by mouth daily.     Nutritional Supplements (ENSURE ACTIVE HEART HEALTH PO) Take 237 mLs by mouth See admin instructions. Drink 237 ml's by mouth two times a day- Clear/apple flavor     polyethylene glycol (MIRALAX) 17 g packet Take 17 g by mouth daily. 14 each 0   No facility-administered medications prior to visit.    PAST MEDICAL HISTORY: Past Medical History:  Diagnosis Date   BPH (benign prostatic hyperplasia)    Cerebrovascular disease    Clostridium difficile diarrhea    Congenital anomaly of diaphragm    Elevated PSA    Glaucoma, both eyes    Hemorrhoid    Hepatitis B surface antigen positive    02-20-2011   History of adenomatous polyp of colon    2007, 2009 and 2013  tubular adenoma's   History of alcohol abuse    quit 1963   History of cerebral parenchymal hemorrhage    01/ 2006  left occiptial lobe related to hypertensive crisis   History  of CVA (cerebrovascular accident)    09-12-2012  left hippocampus/ amygdala junction and per MRI old white matter infarcts--  per pt residual short- term memory issues   History of fatty infiltration of liver hx visit's at [facility] , last visit 05/ 2014   elvated LFT's ,  via liver bx 2004 related to hx alcohol and drug abuse (quit 1964)   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   HTN (hypertension)    Renal cyst, left    Stroke (HCC)    hx of 3 strokes in past    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    Urethral lesion    urethral mass    PAST  SURGICAL HISTORY: Past Surgical History:  Procedure Laterality Date   CARDIOVASCULAR STRESS TEST  05/05/2007   normal nuclear study w/ no ischemia/  normal LV fucntion and wall motion , ef60%   COLONOSCOPY  last one 04-06-2012   CYSTO/  LEFT RETROGRADE PYELOGRAM/ CYTOLOGY WASHINGS/  URETEROSCOPY  03/05/2000   INGUINAL HERNIA REPAIR Bilateral 1965 and 1980's   IR 3D INDEPENDENT WKST  07/31/2022   IR ANGIOGRAM PELVIS SELECTIVE OR SUPRASELECTIVE  07/31/2022   IR ANGIOGRAM SELECTIVE EACH ADDITIONAL VESSEL  07/31/2022   IR ANGIOGRAM SELECTIVE EACH ADDITIONAL VESSEL  07/31/2022   IR ANGIOGRAM SELECTIVE EACH ADDITIONAL VESSEL  07/31/2022   IR ANGIOGRAM SELECTIVE EACH ADDITIONAL VESSEL  07/31/2022   IR EMBO TUMOR ORGAN ISCHEMIA INFARCT INC GUIDE ROADMAPPING  07/31/2022   IR RADIOLOGIST EVAL & MGMT  06/26/2022   IR RADIOLOGIST EVAL & MGMT  08/08/2022   IR RADIOLOGIST EVAL & MGMT  08/21/2022   IR RADIOLOGIST EVAL & MGMT  03/25/2023   IR US GUIDE VASC ACCESS RIGHT  07/31/2022   LAPAROSCOPIC INGUINAL HERNIA WITH UMBILICAL HERNIA Right 06/24/2007   LIVER BIOPSY  1980's and 2004   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   Procedure: PACEMAKER IMPLANT;  Surgeon: Marinus Maw, MD;  Location: [facility];  Service: Cardiovascular;  Laterality: N/A;   SVT ABLATION N/A 11/15/2018   Procedure: SVT  ABLATION;  Surgeon: Marinus Maw, MD;  Location: [facility];  Service: Cardiovascular;  Laterality: N/A;   TRANSTHORACIC ECHOCARDIOGRAM  09/13/2012   moderate LVH,  ef 60-65%/     TRANSURETHRAL RESECTION OF BLADDER TUMOR N/A 08/11/2016   Procedure: TRANSURETHRAL RESECTION OF BLADDER TUMOR (TURBT);  Surgeon: Malen Gauze, MD;  Location: [facility];  Service: Urology;  Laterality: N/A;    FAMILY HISTORY: Family History  Problem Relation Age of Onset   Rheum arthritis Mother    Diabetes Mother    Stroke Mother    Heart attack Mother    Kidney failure Mother    Heart attack Father    Heart disease Maternal Grandmother    Rheum arthritis Maternal Grandmother    Diabetes Maternal Grandmother    Stroke Maternal Grandmother    Colon cancer Neg Hx     SOCIAL HISTORY: Social History   Socioeconomic History   Marital status: Married    Spouse name: [name]   Number of children: 1   Years of education: College   Highest education level: Not on file  Occupational History   Occupation: Environmental manager  Tobacco Use   Smoking status: Former    Current packs/day: 0.00    Average packs/day: 1 pack/day for 5.0 years (5.0 ttl pk-yrs)    Types: Cigarettes    Start date: 11/30/1976    Quit date: 11/30/1981    Years since quitting: 41.4   Smokeless tobacco: Never  Vaping Use   Vaping status: Never Used  Substance and Sexual Activity   Alcohol use: No    Alcohol/week: 0.0 standard drinks of alcohol    Comment: hx abuse -- quit:  1963   Drug use: No    Comment: 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   Caffeine Use:  Tea, lots   Social Determinants of Health   Financial Resource Strain: Not on file  Food  Insecurity: No Food Insecurity (03/15/2023)   Hunger Vital Sign    Worried About Running Out of Food in the Last  Year: Never true    Ran Out of Food in the Last Year: Never true  Transportation Needs: No Transportation Needs (03/15/2023)   PRAPARE - Administrator, Civil Service (Medical): No    Lack of Transportation (Non-Medical): No  Physical Activity: Not on file  Stress: Not on file  Social Connections: Unknown (10/14/2021)   Received from [facility], [facility]   Social Network    Social Network: Not on file  Intimate Partner Violence: Unknown (09/05/2021)   Received from [facility]   HITS    Physically Hurt: Not on file    Insult or Talk Down To: Not on file    Threaten Physical Harm: Not on file    Scream or Curse: Not on file     PHYSICAL EXAM  GENERAL EXAM/CONSTITUTIONAL: Vitals:  Vitals:   05/06/23 1054  BP: 109/63  Pulse: 60  Weight: 164 lb (74.4 kg)  Height: 5\' 11"  (1.803 m)   Body mass index is 22.87 kg/m. Wt Readings from Last 3 Encounters:  05/06/23 164 lb (74.4 kg)  04/25/23 154 lb 5.2 oz (70 kg)  04/24/23 154 lb 5.2 oz (70 kg)   Patient is in no distress; well developed, nourished and groomed; neck is supple  CARDIOVASCULAR: Examination of carotid arteries is normal; no carotid bruits Regular rate and rhythm, no murmurs Examination of peripheral vascular system by observation and palpation is normal  EYES: Ophthalmoscopic exam of optic discs and posterior segments is normal; no papilledema or hemorrhages No results found.  MUSCULOSKELETAL: Gait, strength, tone, movements noted in Neurologic exam below  NEUROLOGIC: MENTAL STATUS:     08/14/2020   10:11 AM 05/14/2020   12:53 PM 03/14/2019    2:04 PM  MMSE - Mini Mental State Exam  Orientation to time 4 5 5   Orientation to Place 4 4 4   Registration 3 3 3   Attention/ Calculation 3 3 4   Recall 3 3 3   Language- name 2 objects 2 2 2   Language- repeat 1 1 1   Language- follow 3 step command 3 3 3   Language- read & follow direction 1 1 1   Write a sentence 1 1 1   Copy  design 0 0 1  Total score 25 26 28    awake, alert, oriented to person DECR MEMORY DECR attention and concentration DECR FLUENCY fund of knowledge appropriate  CRANIAL NERVE:  2nd, 3rd, 4th, 6th - pupils equal and reactive to light, visual fields --> DECR IN RIGHT FIELD; CANNOT READ WORDS; ABLE TO SEE SOME LETTERS ON LEFT AND PARTIALLY COUNT FINGERS ON THE LEFT 5th - facial sensation symmetric 7th - facial strength symmetric 8th - hearing intact 9th - palate elevates symmetrically, uvula midline 11th - shoulder shrug symmetric 12th - tongue protrusion midline  MOTOR:  normal bulk and tone, full strength in the BUE, BLE  SENSORY:  normal and symmetric to light touch, temperature, vibration  COORDINATION:  finger-nose-finger, fine finger movements normal  REFLEXES:  deep tendon reflexes TRACE and symmetric  GAIT/STATION:  narrow based gait; USING WALKER      DIAGNOSTIC DATA (LABS, IMAGING, TESTING) - I reviewed patient records, labs, notes, testing and imaging myself where available.  Lab Results  Component Value Date   WBC 7.5 04/25/2023   HGB 14.6 04/25/2023   HCT 43.5 04/25/2023   MCV 95.6  04/25/2023   PLT 171 04/25/2023      Component Value Date/Time   NA 140 04/25/2023 2020   NA 143 11/12/2018 0950   K 3.7 04/25/2023 2020   CL 108 04/25/2023 2020   CO2 25 04/25/2023 2020   GLUCOSE 111 (H) 04/25/2023 2020   BUN 18 04/25/2023 2020   BUN 15 11/12/2018 0950   CREATININE 1.20 04/25/2023 2020   CALCIUM 9.7 04/25/2023 2020   PROT 7.4 04/25/2023 2020   ALBUMIN 3.6 04/25/2023 2020   AST 26 04/25/2023 2020   ALT 25 04/25/2023 2020   ALKPHOS 66 04/25/2023 2020   BILITOT 0.4 04/25/2023 2020   GFRNONAA >60 04/25/2023 2020   GFRAA 60 (L) 02/28/2020 1152   Lab Results  Component Value Date   CHOL 108 03/18/2023   HDL 30 (L) 03/18/2023   LDLCALC 67 03/18/2023   TRIG 55 03/18/2023   CHOLHDL 3.6 03/18/2023   Lab Results  Component Value Date   HGBA1C 5.8  (H) 03/16/2023   Lab Results  Component Value Date   VITAMINB12 431 06/28/2013   Lab Results  Component Value Date   TSH 1.024 03/16/2023    10/16/16 MRI HEAD [I reviewed images myself and agree with interpretation. Moderate ventriculomegaly and atrophy.  -VRP]  1. No acute intracranial infarct or other process identified. 2. Innumerable chronic micro hemorrhages scattered throughout the brain, most likely related to chronic underlying hypertension given patient history. 3. Stable atrophy with moderate chronic microvascular ischemic disease.   10/16/16 MRA HEAD [I reviewed images myself and agree with interpretation. -VRP]  Negative intracranial MRA. No large or proximal arterial branch occlusion. No high-grade or correctable stenosis.  10/16/16 MRA NECK [I reviewed images myself and agree with interpretation. -VRP]  1. Negative MRA of the neck. No hemodynamically significant or critical stenosis identified within the neck.  2. 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  01/23/17 MRI brain - No acute treatment pathology identified, specifically no acute infarct. - Remote lacunar infarcts in the bilateral frontal lobe centrum semiovale. - Moderate chronic small vessel ischemic change. - Multiple scattered foci of susceptibility most abundant within the peripheral cerebrum and cerebellum which may reflect amyloid angiopathy pattern.   07/08/17 CT head [I reviewed images myself and agree with interpretation. -VRP]  1. No CT evidence for acute intracranial abnormality. 2. Atrophy and small vessel ischemic changes of the white matter.  06/09/17 TTE - Left ventricle: The cavity size was normal. Systolic function was   normal. The estimated ejection fraction was in the range of 60%   to 65%. Wall motion was normal; there were no regional wall   motion abnormalities. There was an increased relative   contribution of atrial contraction to ventricular filling.    Doppler parameters are consistent with abnormal left ventricular   relaxation (grade 1 diastolic dysfunction). Doppler parameters   are consistent with high ventricular filling pressure. - Mitral valve: There was trivial regurgitation. - Pulmonary arteries: Systolic pressure could not be accurately   estimated.  09/07/18 carotid u/s Right Carotid: There was no evidence of thrombus, dissection, atherosclerotic                plaque or stenosis in the cervical carotid system. Left Carotid: There is no evidence of stenosis in the left ICA. The extracranial               vessels were near-normal with only minimal wall thickening or  plaque. Vertebrals:  Bilateral vertebral arteries demonstrate antegrade flow. Subclavians: Normal flow hemodynamics were seen in bilateral subclavian              arteries.  09/24/18 MRI brain - Acute nonhemorrhagic white matter infarct, RIGHT centrum semiovale. - Atrophy and small vessel disease.  11/15/18 EP study 1. Sinus rhythm upon presentation.  2. 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 Successful radiofrequency modification of the slow AV nodal pathway  4. No inducible arrhythmias following ablation.  5. No early apparent complications.   05/03/20 EEG - This study is suggestive of non-specific cortical dysfuntion in right temporal region. No seizures or epileptiform discharges were seen throughout the recording.  05/03/20 MRI brain - Negative for acute infarct - Atrophy and moderate chronic ischemic change. - Numerous foci of chronic microhemorrhage throughout the cerebellum and cerebrum bilaterally. This is unchanged from the prior study. Differential includes uncontrolled hypertension and cerebral amyloid.    ASSESSMENT AND PLAN  82 y.o. year old male here with gradual onset progressive  short-term memory loss confusion and decline in ADLs, consistent with mild dementia.  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  Abnormal spells of staring and zoning out are could be presyncope and hypotension related and possible amyloid spells.    Dx:  1. Moderate late onset Alzheimer's dementia without behavioral disturbance, psychotic disturbance, mood disturbance, or anxiety (HCC)   2. Cerebrovascular accident (CVA) due to bilateral thrombosis of posterior cerebral arteries (HCC)   3. Cerebral amyloid angiopathy (HCC)       PLAN:  STROKE  Stroke - Acute left PCA infarct Oct 2024, etiology: Likely large vessel disease  CT head 10/12: No acute finding MRI brain: Acute infarcts in the bilateral PCA territory, left greater than right, involving the bilateral occipital lobes.  Supra and infratentorial microhemorrhages in a lobar distribution, in keeping with patient's history of amyloid angiopathy.  Sequelae of severe chronic microvascular ischemic change. CT angio head and neck 10/15: Occlusion of the proximal left P2, with reconstitution and mid left P2, which is new from the 2022 CTA.   2D Echo LVEF 60-65%  LDL 67 HgbA1c 5.8 UDS negative VTE prophylaxis - SCDs No antithrombotic prior to admission, now on aspirin 81 mg daily only due to history of ICH with a known background of CAA.    Hx of small left occipital ICH 06/2004 CAA diagnosed in 2021 History of ischemic strokes 2022, 2020, and 2014 Acute microhemorrhage on MRI brain 03/2021 Atrial flutter history not on AC due to history of CAA with ICH CAA "spells" with episode of unresponsiveness, extensive unremarkable work up including neuroimaging and EEGs in the past Spells described as transient confusion and aphasia Now on aspirin 81 only Strict BP measurement, BP goal less than 140   Atrial flutter Home Meds: None due to CAA with multiple chronic microhemorrhages and history of ICH Continue telemetry  monitoring   Hypertension Home meds:  Toprol-XL Stable On metoprolol 25 and Norvasc 10 SBP <140  Long term blood pressure goal < 140 given CAA   Lipid management Home meds:  none LDL 67, goal < 70 No statin given LDL goal and known CAA  ABNORMAL SPELL / CONFUSION (witnessed event in clinic 05/14/20 and 08/14/20; no EEG correlation on VEEG in Dec 2021; epileptic seizures ruled out; could be related to transient hypotension vs other amyloid spells) -  monitor symptoms and BP  RECURRENT DIZZINESS / SYNCOPE / PRESYNCOPE - continue medical mgmt of BP, afib per PCP and cardiology  DEMENTIA (VASCULAR + neurodegenerative / alzheimer's) - continue memantine (previously tried donepezil) - safety / supervision issues reviewed - NO driving; NEEDS HELP WITH MEDS AND ADLS (HAS HOME AIDS NOW)  Return for return to PCP, pending if symptoms worsen or fail to improve.    Suanne Marker, MD 05/06/2023, 11:32 AM Certified in Neurology, Neurophysiology and Neuroimaging  [facility] [address] [place], [place] [address] [phone]

## 2023-05-14 DIAGNOSIS — N401 Enlarged prostate with lower urinary tract symptoms: Secondary | ICD-10-CM | POA: Diagnosis not present

## 2023-05-14 DIAGNOSIS — R3915 Urgency of urination: Secondary | ICD-10-CM | POA: Diagnosis not present

## 2023-06-05 DIAGNOSIS — N189 Chronic kidney disease, unspecified: Secondary | ICD-10-CM | POA: Diagnosis not present

## 2023-06-05 DIAGNOSIS — I10 Essential (primary) hypertension: Secondary | ICD-10-CM | POA: Diagnosis not present

## 2023-06-10 DIAGNOSIS — E870 Hyperosmolality and hypernatremia: Secondary | ICD-10-CM | POA: Diagnosis not present

## 2023-06-10 DIAGNOSIS — I2510 Atherosclerotic heart disease of native coronary artery without angina pectoris: Secondary | ICD-10-CM | POA: Diagnosis not present

## 2023-06-10 DIAGNOSIS — F0390 Unspecified dementia without behavioral disturbance: Secondary | ICD-10-CM | POA: Diagnosis not present

## 2023-06-10 DIAGNOSIS — I10 Essential (primary) hypertension: Secondary | ICD-10-CM | POA: Diagnosis not present

## 2023-06-10 DIAGNOSIS — E860 Dehydration: Secondary | ICD-10-CM | POA: Diagnosis not present

## 2023-06-10 DIAGNOSIS — N390 Urinary tract infection, site not specified: Secondary | ICD-10-CM | POA: Diagnosis not present

## 2023-06-30 ENCOUNTER — Encounter (HOSPITAL_COMMUNITY): Payer: Self-pay

## 2023-07-01 ENCOUNTER — Ambulatory Visit (HOSPITAL_COMMUNITY)
Admission: RE | Admit: 2023-07-01 | Discharge: 2023-07-01 | Disposition: A | Discharge status: Home / Self Care | Payer: No Typology Code available for payment source | Source: Ambulatory Visit | Attending: Internal Medicine | Admitting: Internal Medicine

## 2023-07-01 DIAGNOSIS — M545 Low back pain, unspecified: Secondary | ICD-10-CM | POA: Diagnosis present

## 2023-07-10 ENCOUNTER — Ambulatory Visit (INDEPENDENT_AMBULATORY_CARE_PROVIDER_SITE_OTHER): Payer: Medicare PPO

## 2023-07-10 DIAGNOSIS — I442 Atrioventricular block, complete: Secondary | ICD-10-CM | POA: Diagnosis not present

## 2023-08-01 DIAGNOSIS — U071 COVID-19: Secondary | ICD-10-CM

## 2023-08-01 HISTORY — DX: COVID-19: U07.1

## 2023-08-06 ENCOUNTER — Observation Stay (HOSPITAL_COMMUNITY)
Admission: EM | Admit: 2023-08-06 | Discharge: 2023-08-07 | Disposition: A | Discharge status: Home / Self Care | Attending: Internal Medicine | Admitting: Internal Medicine

## 2023-08-06 ENCOUNTER — Observation Stay (HOSPITAL_COMMUNITY)

## 2023-08-06 ENCOUNTER — Other Ambulatory Visit: Payer: Self-pay

## 2023-08-06 ENCOUNTER — Emergency Department (HOSPITAL_COMMUNITY)

## 2023-08-06 ENCOUNTER — Encounter (HOSPITAL_COMMUNITY): Payer: Self-pay

## 2023-08-06 DIAGNOSIS — Z7982 Long term (current) use of aspirin: Secondary | ICD-10-CM | POA: Insufficient documentation

## 2023-08-06 DIAGNOSIS — Z8673 Personal history of transient ischemic attack (TIA), and cerebral infarction without residual deficits: Secondary | ICD-10-CM | POA: Insufficient documentation

## 2023-08-06 DIAGNOSIS — Z87891 Personal history of nicotine dependence: Secondary | ICD-10-CM | POA: Insufficient documentation

## 2023-08-06 DIAGNOSIS — U071 COVID-19: Secondary | ICD-10-CM | POA: Diagnosis not present

## 2023-08-06 DIAGNOSIS — H409 Unspecified glaucoma: Secondary | ICD-10-CM | POA: Diagnosis not present

## 2023-08-06 DIAGNOSIS — R770 Abnormality of albumin: Secondary | ICD-10-CM | POA: Insufficient documentation

## 2023-08-06 DIAGNOSIS — Z79899 Other long term (current) drug therapy: Secondary | ICD-10-CM | POA: Diagnosis not present

## 2023-08-06 DIAGNOSIS — R55 Syncope and collapse: Secondary | ICD-10-CM | POA: Diagnosis not present

## 2023-08-06 DIAGNOSIS — I10 Essential (primary) hypertension: Secondary | ICD-10-CM | POA: Insufficient documentation

## 2023-08-06 DIAGNOSIS — Z8679 Personal history of other diseases of the circulatory system: Secondary | ICD-10-CM | POA: Insufficient documentation

## 2023-08-06 DIAGNOSIS — R627 Adult failure to thrive: Secondary | ICD-10-CM | POA: Diagnosis not present

## 2023-08-06 DIAGNOSIS — Z950 Presence of cardiac pacemaker: Secondary | ICD-10-CM | POA: Diagnosis not present

## 2023-08-06 DIAGNOSIS — E860 Dehydration: Secondary | ICD-10-CM | POA: Diagnosis not present

## 2023-08-06 DIAGNOSIS — F0150 Vascular dementia without behavioral disturbance: Secondary | ICD-10-CM | POA: Diagnosis not present

## 2023-08-06 DIAGNOSIS — Z95818 Presence of other cardiac implants and grafts: Secondary | ICD-10-CM | POA: Diagnosis not present

## 2023-08-06 DIAGNOSIS — R61 Generalized hyperhidrosis: Secondary | ICD-10-CM | POA: Diagnosis not present

## 2023-08-06 DIAGNOSIS — D61818 Other pancytopenia: Secondary | ICD-10-CM | POA: Diagnosis not present

## 2023-08-06 DIAGNOSIS — N400 Enlarged prostate without lower urinary tract symptoms: Secondary | ICD-10-CM | POA: Diagnosis not present

## 2023-08-06 LAB — COMPREHENSIVE METABOLIC PANEL
ALT: 17 U/L (ref 0–44)
AST: 21 U/L (ref 15–41)
Albumin: 2.8 g/dL — ABNORMAL LOW (ref 3.5–5.0)
Alkaline Phosphatase: 50 U/L (ref 38–126)
Anion gap: 6 (ref 5–15)
BUN: 12 mg/dL (ref 8–23)
CO2: 21 mmol/L — ABNORMAL LOW (ref 22–32)
Calcium: 7.8 mg/dL — ABNORMAL LOW (ref 8.9–10.3)
Chloride: 112 mmol/L — ABNORMAL HIGH (ref 98–111)
Creatinine, Ser: 1.06 mg/dL (ref 0.61–1.24)
GFR, Estimated: >60 mL/min (ref >60)
Glucose, Bld: 90 mg/dL (ref 70–99)
Potassium: 3.6 mmol/L (ref 3.5–5.1)
Sodium: 139 mmol/L (ref 135–145)
Total Bilirubin: 0.6 mg/dL (ref 0.0–1.2)
Total Protein: 5.5 g/dL — ABNORMAL LOW (ref 6.5–8.1)

## 2023-08-06 LAB — CBC WITH DIFFERENTIAL/PLATELET
Abs Immature Granulocytes: 0.01 10*3/uL (ref 0.00–0.07)
Basophils Absolute: 0 10*3/uL (ref 0.0–0.1)
Basophils Relative: 0 %
Eosinophils Absolute: 0 10*3/uL (ref 0.0–0.5)
Eosinophils Relative: 1 %
HCT: 33.5 % — ABNORMAL LOW (ref 39.0–52.0)
Hemoglobin: 11 g/dL — ABNORMAL LOW (ref 13.0–17.0)
Immature Granulocytes: 0 %
Lymphocytes Relative: 13 %
Lymphs Abs: 0.4 10*3/uL — ABNORMAL LOW (ref 0.7–4.0)
MCH: 32.4 pg (ref 26.0–34.0)
MCHC: 32.8 g/dL (ref 30.0–36.0)
MCV: 98.8 fL (ref 80.0–100.0)
Monocytes Absolute: 0.3 10*3/uL (ref 0.1–1.0)
Monocytes Relative: 9 %
Neutro Abs: 2.6 10*3/uL (ref 1.7–7.7)
Neutrophils Relative %: 77 %
Platelets: 67 10*3/uL — ABNORMAL LOW (ref 150–400)
RBC: 3.39 MIL/uL — ABNORMAL LOW (ref 4.22–5.81)
RDW: 13.3 % (ref 11.5–15.5)
WBC: 3.3 10*3/uL — ABNORMAL LOW (ref 4.0–10.5)
nRBC: 0 % (ref 0.0–0.2)

## 2023-08-06 LAB — MAGNESIUM: Magnesium: 1.6 mg/dL — ABNORMAL LOW (ref 1.7–2.4)

## 2023-08-06 LAB — CBC
HCT: 46.3 % (ref 39.0–52.0)
Hemoglobin: 16 g/dL (ref 13.0–17.0)
MCH: 32.2 pg (ref 26.0–34.0)
MCHC: 34.6 g/dL (ref 30.0–36.0)
MCV: 93.2 fL (ref 80.0–100.0)
Platelets: 104 10*3/uL — ABNORMAL LOW (ref 150–400)
RBC: 4.97 MIL/uL (ref 4.22–5.81)
RDW: 13.3 % (ref 11.5–15.5)
WBC: 3.7 10*3/uL — ABNORMAL LOW (ref 4.0–10.5)
nRBC: 0 % (ref 0.0–0.2)

## 2023-08-06 LAB — RESP PANEL BY RT-PCR (RSV, FLU A&B, COVID)  RVPGX2
Influenza A by PCR: NEGATIVE
Influenza B by PCR: NEGATIVE
Resp Syncytial Virus by PCR: NEGATIVE
SARS Coronavirus 2 by RT PCR: POSITIVE — AB

## 2023-08-06 LAB — CBG MONITORING, ED: Glucose-Capillary: 92 mg/dL (ref 70–99)

## 2023-08-06 LAB — CREATININE, SERUM
Creatinine, Ser: 1.01 mg/dL (ref 0.61–1.24)
GFR, Estimated: >60 mL/min (ref >60)

## 2023-08-06 LAB — LACTATE DEHYDROGENASE: LDH: 149 U/L (ref 98–192)

## 2023-08-06 MED ORDER — ENOXAPARIN SODIUM 40 MG/0.4ML IJ SOSY
40.0000 mg | PREFILLED_SYRINGE | INTRAMUSCULAR | Status: DC
  Administered 2023-08-06: 40 mg via SUBCUTANEOUS
  Filled 2023-08-06: qty 0.4

## 2023-08-06 MED ORDER — DUTASTERIDE 0.5 MG PO CAPS
0.5000 mg | ORAL_CAPSULE | Freq: Every morning | ORAL | Status: DC
  Administered 2023-08-07: 0.5 mg via ORAL
  Filled 2023-08-06 (×2): qty 1

## 2023-08-06 MED ORDER — ONDANSETRON HCL 4 MG/2ML IJ SOLN: 4.0000 mg | Freq: Four times a day (QID) | INTRAMUSCULAR | Status: DC | PRN

## 2023-08-06 MED ORDER — BISACODYL 10 MG RE SUPP: 10.0000 mg | Freq: Every day | RECTAL | Status: DC | PRN

## 2023-08-06 MED ORDER — ACETAMINOPHEN 325 MG PO TABS: 650.0000 mg | ORAL_TABLET | Freq: Four times a day (QID) | ORAL | Status: DC | PRN

## 2023-08-06 MED ORDER — SODIUM CHLORIDE 0.9 % IV BOLUS
500.0000 mL | Freq: Once | INTRAVENOUS | Status: AC
  Administered 2023-08-06: 500 mL via INTRAVENOUS

## 2023-08-06 MED ORDER — POLYETHYLENE GLYCOL 3350 17 G PO PACK: 17.0000 g | PACK | Freq: Every day | ORAL | Status: DC | PRN

## 2023-08-06 MED ORDER — ALUM & MAG HYDROXIDE-SIMETH 200-200-20 MG/5ML PO SUSP: 30.0000 mL | Freq: Four times a day (QID) | ORAL | Status: DC | PRN

## 2023-08-06 MED ORDER — THIAMINE MONONITRATE 100 MG PO TABS
100.0000 mg | ORAL_TABLET | Freq: Every day | ORAL | Status: DC
  Administered 2023-08-06 – 2023-08-07 (×2): 100 mg via ORAL
  Filled 2023-08-06 (×2): qty 1

## 2023-08-06 MED ORDER — OXYCODONE HCL 5 MG PO TABS: 5.0000 mg | ORAL_TABLET | ORAL | Status: DC | PRN

## 2023-08-06 MED ORDER — ADULT MULTIVITAMIN W/MINERALS CH
1.0000 | ORAL_TABLET | Freq: Every day | ORAL | Status: DC
  Administered 2023-08-06 – 2023-08-07 (×2): 1 via ORAL
  Filled 2023-08-06 (×2): qty 1

## 2023-08-06 MED ORDER — SODIUM CHLORIDE 0.9 % IV BOLUS: 500.0000 mL | Freq: Once | INTRAVENOUS | Status: DC

## 2023-08-06 MED ORDER — SODIUM CHLORIDE 0.9% FLUSH: 3.0000 mL | Freq: Two times a day (BID) | INTRAVENOUS | Status: DC

## 2023-08-06 MED ORDER — BRIMONIDINE TARTRATE 0.2 % OP SOLN
1.0000 [drp] | Freq: Two times a day (BID) | OPHTHALMIC | Status: DC
  Administered 2023-08-06 – 2023-08-07 (×2): 1 [drp] via OPHTHALMIC
  Filled 2023-08-06: qty 5

## 2023-08-06 MED ORDER — ONDANSETRON HCL 4 MG PO TABS: 4.0000 mg | ORAL_TABLET | Freq: Four times a day (QID) | ORAL | Status: DC | PRN

## 2023-08-06 MED ORDER — MAGNESIUM SULFATE 2 GM/50ML IV SOLN
2.0000 g | Freq: Once | INTRAVENOUS | Status: AC
  Administered 2023-08-06: 2 g via INTRAVENOUS
  Filled 2023-08-06: qty 50

## 2023-08-06 MED ORDER — HYDROMORPHONE HCL 1 MG/ML IJ SOLN: 0.5000 mg | INTRAMUSCULAR | Status: DC | PRN

## 2023-08-06 MED ORDER — SODIUM CHLORIDE 0.9 % IV SOLN: INTRAVENOUS | Status: DC

## 2023-08-06 MED ORDER — ASPIRIN 81 MG PO CHEW
81.0000 mg | CHEWABLE_TABLET | Freq: Every day | ORAL | Status: DC
  Administered 2023-08-06 – 2023-08-07 (×2): 81 mg via ORAL
  Filled 2023-08-06 (×2): qty 1

## 2023-08-06 MED ORDER — LATANOPROST 0.005 % OP SOLN
1.0000 [drp] | Freq: Every day | OPHTHALMIC | Status: DC
  Administered 2023-08-06: 1 [drp] via OPHTHALMIC
  Filled 2023-08-06: qty 2.5

## 2023-08-06 MED ORDER — DORZOLAMIDE HCL 2 % OP SOLN
1.0000 [drp] | Freq: Two times a day (BID) | OPHTHALMIC | Status: DC
  Administered 2023-08-06 – 2023-08-07 (×2): 1 [drp] via OPHTHALMIC
  Filled 2023-08-06: qty 10

## 2023-08-06 MED ORDER — FOLIC ACID 1 MG PO TABS
1.0000 mg | ORAL_TABLET | Freq: Every day | ORAL | Status: DC
  Administered 2023-08-06 – 2023-08-07 (×2): 1 mg via ORAL
  Filled 2023-08-06 (×2): qty 1

## 2023-08-06 NOTE — ED Notes (Signed)
 Pt had large loose stool when received pt from EMS. This RN and NT to change pt

## 2023-08-06 NOTE — ED Triage Notes (Signed)
 Pt to ED via [facility] from home c/o syncopal episode while sitting and hypotension; Initial bp 70 palp. when placed in supine symptoms resolved.   Last VS: 124/70, HR60, cbg 173  Hx pacemaker, dementia   Pt wife recently dx with COVID.  #20 LFA NS bolus given   [name] [phone] (pt wife)

## 2023-08-06 NOTE — Progress Notes (Signed)
 Patient to [phone] at this time

## 2023-08-06 NOTE — H&P (Signed)
 History and Physical    Patient: Dustin Hamilton:[id number] DOB: November 19, 1940 DOA: 08/06/2023 DOS: the patient was seen and examined on 08/06/2023 PCP: [facility]  Patient coming from: Home  Chief Complaint:  Chief Complaint  Patient presents with   Loss of Consciousness   HPI: Dustin Hamilton is a 83 y.o. 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  Patient is pleasantly confused and is a poor historian so a reliable history is not able to be obtained from the patient so most of the history was obtained from the EDP discussion and discussion with the patient's wife.  Patient was in his usual state of health this morning and his home health nurse came out to evaluate and help him.  His wife has been feeling ill and she tested positive for COVID and patient was going to get up and ambulate with the assistance of the home health aide.  The home health aide got the patient up and he told her that he was feeling weak so he sat back down and his rolling walker and she began wearing him to the end of the hallway he then subsequently slumped over and hung his head would not respond to physical verbal stimuli and this lasted about 5 minutes and subsequently came around.  By the time EMS came they found his blood pressure to be in his 70s systolic.  He received a 500 mL bolus and reported no issues and does not really recollect what happened and cannot explain to me what happened either.  Denies any chest pain, shortness breath, denies any cough.  He does have a history of chronic dizzy spells but he denied any today.  Upon arrival he did have a large loose stool in the ED.  Subsequently patient also tested positive for  COVID-19.  [facility] was asked to admit this patient for syncope and evaluation of COVID-19.  Review of Systems: As mentioned in the history of present illness. All other systems reviewed and are negative. Past Medical History:  Diagnosis Date   BPH (benign prostatic hyperplasia)    Cerebrovascular disease    Clostridium difficile diarrhea    Congenital anomaly of diaphragm    Elevated PSA    Glaucoma, both eyes    Hemorrhoid    Hepatitis B surface antigen positive    02-20-2011   History of adenomatous polyp of colon    2007, 2009 and 2013  tubular adenoma's   History of alcohol abuse    quit 1963   History of cerebral parenchymal hemorrhage    01/ 2006  left occiptial lobe related to hypertensive crisis   History of CVA (cerebrovascular accident)    09-12-2012  left hippocampus/ amygdala junction and per MRI old white matter infarcts--  per pt residual short- term memory issues   History of fatty infiltration of liver hx visit's at [facility] , last visit 05/ 2014   elvated LFT's ,  via liver bx 2004 related to hx alcohol and drug abuse (quit 1964)   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   HTN (hypertension)    Renal cyst, left    Stroke (HCC)    hx of 3 strokes in  past    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    Urethral lesion    urethral mass   Past Surgical History:  Procedure Laterality Date   CARDIOVASCULAR STRESS TEST  05/05/2007   normal nuclear study w/ no ischemia/  normal LV fucntion and wall motion , ef60%   COLONOSCOPY  last one 04-06-2012   CYSTO/  LEFT RETROGRADE PYELOGRAM/ CYTOLOGY WASHINGS/  URETEROSCOPY  03/05/2000   INGUINAL HERNIA REPAIR Bilateral 1965 and 1980's   IR 3D INDEPENDENT WKST  07/31/2022   IR ANGIOGRAM PELVIS SELECTIVE OR SUPRASELECTIVE  07/31/2022   IR ANGIOGRAM SELECTIVE EACH ADDITIONAL VESSEL  07/31/2022   IR ANGIOGRAM SELECTIVE EACH ADDITIONAL VESSEL  07/31/2022   IR ANGIOGRAM  SELECTIVE EACH ADDITIONAL VESSEL  07/31/2022   IR ANGIOGRAM SELECTIVE EACH ADDITIONAL VESSEL  07/31/2022   IR EMBO TUMOR ORGAN ISCHEMIA INFARCT INC GUIDE ROADMAPPING  07/31/2022   IR RADIOLOGIST EVAL & MGMT  06/26/2022   IR RADIOLOGIST EVAL & MGMT  08/08/2022   IR RADIOLOGIST EVAL & MGMT  08/21/2022   IR RADIOLOGIST EVAL & MGMT  03/25/2023   IR US GUIDE VASC ACCESS RIGHT  07/31/2022   LAPAROSCOPIC INGUINAL HERNIA WITH UMBILICAL HERNIA Right 06/24/2007   LIVER BIOPSY  1980's and 2004   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   Procedure: PACEMAKER IMPLANT;  Surgeon: Marinus Maw, MD;  Location: [facility];  Service: Cardiovascular;  Laterality: N/A;   SVT ABLATION N/A 11/15/2018   Procedure: SVT ABLATION;  Surgeon: Marinus Maw, MD;  Location: [facility];  Service: Cardiovascular;  Laterality: N/A;   TRANSTHORACIC ECHOCARDIOGRAM  09/13/2012   moderate LVH,  ef 60-65%/     TRANSURETHRAL RESECTION OF BLADDER TUMOR N/A 08/11/2016   Procedure: TRANSURETHRAL RESECTION OF BLADDER TUMOR (TURBT);  Surgeon: Malen Gauze, MD;  Location: [facility];  Service: Urology;  Laterality: N/A;   Social History:  reports that he quit smoking about 41 years ago. His smoking use included cigarettes. He started smoking about 46 years ago. He has a 5 pack-year smoking history. He has never used smokeless tobacco. He reports that he does not drink alcohol and does not use drugs.  Allergies  Allergen Reactions   Penicillins Hives   Levetiracetam Other (See Comments)    Altered mental status   Aricept [Donepezil] Other (See Comments)    Dizziness    Exelon [Rivastigmine] Other (See Comments)    Malaise    Viagra [Sildenafil] Palpitations and Other (See Comments)    Dizziness, also    Family History  Problem Relation Age of Onset   Rheum arthritis Mother    Diabetes Mother    Stroke Mother    Heart attack Mother    Kidney failure Mother    Heart attack Father     Heart disease Maternal Grandmother    Rheum arthritis Maternal Grandmother    Diabetes Maternal Grandmother    Stroke Maternal Grandmother    Colon cancer Neg Hx    Prior to Admission medications   Medication Sig Start Date End Date Taking? Authorizing Provider  acetaminophen (TYLENOL) 325 MG tablet Take 2 tablets (650 mg total) by mouth every 6 (six) hours as needed for mild pain (pain score 1-3) (or Fever >/= 101). 03/21/23  Yes Elgergawy, Leana Roe, MD  allopurinol (ZYLOPRIM) 100 MG tablet Take 1 tablet (100 mg total) by mouth daily. Patient not taking: Reported on 08/06/2023 04/05/21 03/14/24  Joycelyn Das, MD  amLODipine (NORVASC) 10 MG tablet Take 1 tablet (10 mg total) by mouth daily. Patient not taking: Reported on 08/06/2023 03/22/23   Elgergawy, Leana Roe, MD  aspirin 81 MG chewable tablet Chew 1 tablet (81 mg total) by mouth daily. Patient not taking: Reported on 08/06/2023 03/22/23   Elgergawy, Leana Roe, MD  brimonidine (ALPHAGAN) 0.2 % ophthalmic solution Place 1 drop into both eyes 2 (two) times daily. Patient not taking: Reported on 08/06/2023 10/19/22   Lyndle Herrlich, MD  dorzolamide (TRUSOPT) 2 % ophthalmic solution Place 1 drop into both eyes 2 (two) times daily. Patient not taking: Reported on 08/06/2023    [provider]  dutasteride (AVODART) 0.5 MG capsule Take 0.5 mg by mouth in the morning. Patient not taking: Reported on 08/06/2023    [provider]  latanoprost (XALATAN) 0.005 % ophthalmic solution Place 1 drop into both eyes at bedtime.  Patient not taking: Reported on 08/06/2023    [provider]  memantine (NAMENDA) 10 MG tablet Take 10 mg by mouth 2 (two) times daily. Patient not taking: Reported on 08/06/2023    [provider]  metoprolol succinate (TOPROL-XL) 25 MG 24 hr tablet Take 1 tablet (25 mg total) by mouth daily. Patient not taking: Reported on 08/06/2023 03/22/23   Elgergawy, Leana Roe, MD   Physical Exam: Vitals:    08/06/23 1430 08/06/23 1515 08/06/23 1600 08/06/23 1610  BP: (!) 141/80 (!) 153/85 (!) 156/96   Pulse: (!) 59 62 68 91  Resp: 13 14 17 19   Temp:    98.9 F (37.2 C)  TempSrc:    Oral  SpO2: 100% 100% 100% 100%  Weight:      Height:       Examination: Physical Exam:  Constitutional: WN/WD elderly African-American male in no acute distress Respiratory: Diminished to auscultation bilaterally, no wheezing, rales, rhonchi or crackles. Normal respiratory effort and patient is not tachypenic. No accessory muscle use.  Unlabored breathing Cardiovascular: RRR, no murmurs / rubs / gallops. S1 and S2 auscultated. No extremity edema. Abdomen: Soft, non-tender, non-distended.  Bowel sounds positive.  GU: Deferred. Musculoskeletal: No clubbing / cyanosis of digits/nails. No joint deformity upper and lower extremities.  Skin: No rashes, lesions, ulcers limited skin evaluation. No induration; Warm and dry.  Neurologic: CN 2-12 grossly intact with no focal deficits. Psychiatric: Pleasantly confused and demented and only oriented to himself  Data Reviewed:  Recent Results (from the past 2160 hours)  CBG monitoring, ED     Status: None   Collection Time: 08/06/23 11:33 AM  Result Value Ref Range   Glucose-Capillary 92 70 - 99 mg/dL    Comment: Glucose reference range applies only to samples taken after fasting for at least 8 hours.  CBC with Differential     Status: Abnormal   Collection Time: 08/06/23 12:09 PM  Result Value Ref Range   WBC 3.3 (L) 4.0 - 10.5 K/uL   RBC 3.39 (L) 4.22 - 5.81 MIL/uL   Hemoglobin 11.0 (L) 13.0 - 17.0 g/dL   HCT 27.2 (L) 53.6 - 64.4 %   MCV 98.8 80.0 - 100.0 fL   MCH 32.4 26.0 - 34.0 pg   MCHC 32.8 30.0 - 36.0 g/dL   RDW 03.4 74.2 - 59.5 %   Platelets 67 (L) 150 - 400 K/uL    Comment: Immature Platelet Fraction may be clinically indicated, consider ordering this additional test [id number] REPEATED TO VERIFY    nRBC 0.0 0.0 - 0.2 %  Neutrophils Relative %  77 %   Neutro Abs 2.6 1.7 - 7.7 K/uL   Lymphocytes Relative 13 %   Lymphs Abs 0.4 (L) 0.7 - 4.0 K/uL   Monocytes Relative 9 %   Monocytes Absolute 0.3 0.1 - 1.0 K/uL   Eosinophils Relative 1 %   Eosinophils Absolute 0.0 0.0 - 0.5 K/uL   Basophils Relative 0 %   Basophils Absolute 0.0 0.0 - 0.1 K/uL   Immature Granulocytes 0 %   Abs Immature Granulocytes 0.01 0.00 - 0.07 K/uL    Comment: Performed at [facility], [address], [place], [place] [address]  Comprehensive metabolic panel     Status: Abnormal   Collection Time: 08/06/23 12:09 PM  Result Value Ref Range   Sodium 139 135 - 145 mmol/L   Potassium 3.6 3.5 - 5.1 mmol/L   Chloride 112 (H) 98 - 111 mmol/L   CO2 21 (L) 22 - 32 mmol/L   Glucose, Bld 90 70 - 99 mg/dL    Comment: Glucose reference range applies only to samples taken after fasting for at least 8 hours.   BUN 12 8 - 23 mg/dL   Creatinine, Ser 4.09 0.61 - 1.24 mg/dL   Calcium 7.8 (L) 8.9 - 10.3 mg/dL   Total Protein 5.5 (L) 6.5 - 8.1 g/dL   Albumin 2.8 (L) 3.5 - 5.0 g/dL   AST 21 15 - 41 U/L   ALT 17 0 - 44 U/L   Alkaline Phosphatase 50 38 - 126 U/L   Total Bilirubin 0.6 0.0 - 1.2 mg/dL   GFR, Estimated >81 >19 mL/min    Comment: (NOTE) Calculated using the CKD-EPI Creatinine Equation (2021)    Anion gap 6 5 - 15    Comment: Performed at [facility], [address] [place], [place] [address]  Resp panel by RT-PCR (RSV, Flu A&B, Covid) Anterior Nasal Swab     Status: Abnormal   Collection Time: 08/06/23 12:09 PM   Specimen: Anterior Nasal Swab  Result Value Ref Range   SARS Coronavirus 2 by RT PCR POSITIVE (A) NEGATIVE   Influenza A by PCR NEGATIVE NEGATIVE   Influenza B by PCR NEGATIVE NEGATIVE    Comment: (NOTE) The Xpert Xpress SARS-CoV-2/FLU/RSV plus assay is intended as an aid in the diagnosis of influenza from Nasopharyngeal swab specimens and should not be used as a sole basis for treatment. Nasal washings and aspirates are  unacceptable for Xpert Xpress SARS-CoV-2/FLU/RSV testing.  Fact Sheet for Patients: BloggerCourse.com  Fact Sheet for Healthcare Providers: SeriousBroker.it  This test is not yet approved or cleared by the [place] FDA and has been authorized for detection and/or diagnosis of SARS-CoV-2 by FDA under an Emergency Use Authorization (EUA). This EUA will remain in effect (meaning this test can be used) for the duration of the COVID-19 declaration under Section 564(b)(1) of the Act, 21 U.S.C. section 360bbb-3(b)(1), unless the authorization is terminated or revoked.     Resp Syncytial Virus by PCR NEGATIVE NEGATIVE    Comment: (NOTE) Fact Sheet for Patients: BloggerCourse.com  Fact Sheet for Healthcare Providers: SeriousBroker.it  This test is not yet approved or cleared by the [place] FDA and has been authorized for detection and/or diagnosis of SARS-CoV-2 by FDA under an Emergency Use Authorization (EUA). This EUA will remain in effect (meaning this test can be used) for the duration of the COVID-19 declaration under Section 564(b)(1) of the Act, 21 U.S.C. section 360bbb-3(b)(1), unless the  authorization is terminated or revoked.  Performed at [facility], [address], [place], [place] [address]   Magnesium     Status: Abnormal   Collection Time: 08/06/23 12:09 PM  Result Value Ref Range   Magnesium 1.6 (L) 1.7 - 2.4 mg/dL    Comment: Performed at [facility], [name], [place], [place] [address]   Reviewed labs as above and EKG done and showed a sinus rhythm of 60 with a prolonged PR interval and a QTc of 450 with no evidence of ST elevation on my interpretation  Assessment and Plan: No notes have been filed under this hospital service. Service: Hospitalist  Syncope: Patient synopsized at home and it was witnessed by his Home Health Aide.   Apparently his wife has COVID and the patient was in his usual state of health this morning when his home health aide stood him up he stated he started feeling weak and asked this is down and when he is sitting down he slumped over and holding his head and would not respond to verbal or physical stimuli for about 5 minutes.  Has a history of orthostatic hypotension and a history of chronic dizzy spells without loss of consciousness where he is seen neurology for this.  Has had EEG monitoring without epileptiform discharges.  Check Head CT, ECHOCardiogram, TSH, orthostatic vital signs, start with IV fluid hydration with normal saline at 75 mL/h given additional 500 mL bolus now.  PT OT to further evaluate and treat.  Also check EEG.  Monitor on telemetry may need to interrogate his permanent pacemaker given his history of complete heart block; given that he was hypotensive upon EMS arrival with his blood pressure being in 70 systolic will hold his antihypertensive for tonight.  Also check urinalysis, urine drug screen and blood cultures  COVID 19: Early asymptomatic and not coughing or complaining of shortness of breath.  Check inflammatory markers and consider remdesivir and steroids if necessary. CXR showed No acute disease but did show elevation of the right hemidiaphragm and degenerative changes along the spine with a left upper chest pacemaker noted  HypoMag: Mag was 1.6 and replete with IV mag sulfate 2 g  Hx of CVA and Vascular Dementia: Resume memantine, aspirin 81 mg p.o. daily and placed on delirium precautions  Glaucoma: C/w brimonidine, dorzolamide, and latanoprost  History of BPH s/p prostatic artery embolization: Hold dutasteride for now  CHB s/p PPM and SVT s/p AVNRT Ablation: Monitor on telemetry and hold metoprolol for now  Panyctopenia: WBC is 3.3, hemoglobin/hematocrit 11.0/23.5 and platelet count of 67.  Continue monitor for signs and symptoms of bleeding and check anemia panel in  the a.m. and also order an FOBT.  Likely this is from COVID disease  Hypoalbuminemia: Patient's Albumin was 2.8. CTM and Trend and repeat CMP in the AM  Advance Care Planning:   Code Status: Prior FULL CODE  Consults: None  Family Communication: No family present at beside  Severity of Illness: The appropriate patient status for this patient is OBSERVATION. Observation status is judged to be reasonable and necessary in order to provide the required intensity of service to ensure the patient's safety. The patient's presenting symptoms, physical exam findings, and initial radiographic and laboratory data in the context of their medical condition is felt to place them at decreased risk for further clinical deterioration. Furthermore, it is anticipated that the patient will be medically stable for discharge from the hospital within 2 midnights  of admission.   Author: Marguerita Merles, DO [facility] 08/06/2023 5:18 PM  For on call review [email or web address].

## 2023-08-06 NOTE — ED Provider Notes (Signed)
 [place] EMERGENCY DEPARTMENT AT [facility] Provider Note   CSN: [id number] Arrival date & time: 08/06/23  1121     History Chief Complaint  Patient presents with   Loss of Consciousness    Dustin Hamilton is a 83 y.o. male.  83 year old male comes to the ED for a syncopal event at home. 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   Collateral obtained from wife Dustin Hamilton: She is home sick with recently diagnosed COVID. The patient was in his usual state of health until this morning when his home health aid stood him up and he told her he was feeling week. The aid assisted him in sitting down on his walker and began wheeling him to the end of the hallway. He then slumped over and hung his head and would not respond to their verbal or physical prompting. This lasted about five minutes and he subsequently came around. At the time of EMS arrival, he was found to have a BP in the 70s sytolic so they placed him supine at which time he felt better. He received a bolus with EMS and is feeling well at this time. He has no recollection of the events of this morning. Does not recall if/what he had for breakfast.   By chart review has a history of prior admissions for syncope, most recently in 10/2022 and 03/2023. The episode in 05/24 was thought to be orthostatic and he was diagnosed with an acute PCA infarct as the cause of his syncopal episode in 10/204. He also has a chronic history of dizzy spells w/o loss of consciousness for which he has been seen by neurology. He has had EEG monitoring of these spells without epileptiform discharges.  By chart review it also appears that he has a history of urinary tract infections, though he denies any urinary symptoms at present.  Wife is sick with COVID, he denies any fever, chills, cough, SOB.   Patient did have a large loose stool upon arrival to  the ED.       Home Medications Prior to Admission medications   Medication Sig Start Date End Date Taking? Authorizing Provider  acetaminophen (TYLENOL) 325 MG tablet Take 2 tablets (650 mg total) by mouth every 6 (six) hours as needed for mild pain (pain score 1-3) (or Fever >/= 101). 03/21/23  Yes Elgergawy, Leana Roe, MD  allopurinol (ZYLOPRIM) 100 MG tablet Take 1 tablet (100 mg total) by mouth daily. Patient not taking: Reported on 08/06/2023 04/05/21 03/14/24  Joycelyn Das, MD  amLODipine (NORVASC) 10 MG tablet Take 1 tablet (10 mg total) by mouth daily. Patient not taking: Reported on 08/06/2023 03/22/23   Elgergawy, Leana Roe, MD  aspirin 81 MG chewable tablet Chew 1 tablet (81 mg total) by mouth daily. Patient not taking: Reported on 08/06/2023 03/22/23   Elgergawy, Leana Roe, MD  brimonidine (ALPHAGAN) 0.2 % ophthalmic solution Place 1 drop into both eyes 2 (two) times daily. Patient not taking: Reported on 08/06/2023 10/19/22   Lyndle Herrlich, MD  dorzolamide (TRUSOPT) 2 % ophthalmic solution Place 1 drop into both eyes 2 (two) times daily. Patient not taking: Reported on 08/06/2023    [provider]  dutasteride (AVODART) 0.5 MG capsule Take 0.5 mg by mouth in the morning. Patient not taking: Reported on 08/06/2023    [provider]  latanoprost (XALATAN) 0.005 % ophthalmic solution  Place 1 drop into both eyes at bedtime.  Patient not taking: Reported on 08/06/2023    [provider]  memantine (NAMENDA) 10 MG tablet Take 10 mg by mouth 2 (two) times daily. Patient not taking: Reported on 08/06/2023    [provider]  metoprolol succinate (TOPROL-XL) 25 MG 24 hr tablet Take 1 tablet (25 mg total) by mouth daily. Patient not taking: Reported on 08/06/2023 03/22/23   Elgergawy, Leana Roe, MD      Allergies    Penicillins, Levetiracetam, Aricept [donepezil], Exelon [rivastigmine], and Viagra [sildenafil]    Review of Systems   Review of Systems   Reason unable to perform ROS: dementia; pan negative ROS.    Physical Exam Updated Vital Signs BP (!) 141/80   Pulse (!) 59   Temp 99 F (37.2 C) (Axillary)   Resp 13   Ht 5\' 11"  (1.803 m)   Wt 68 kg   SpO2 100%   BMI 20.92 kg/m  Physical Exam Vitals reviewed.  Constitutional:      Comments: Elderly gentleman in NAD, in good spirits. Does not know why he is here or how he got here.   HENT:     Mouth/Throat:     Comments: Mucous membranes tacky Cardiovascular:     Rate and Rhythm: Normal rate and regular rhythm.     Heart sounds: No murmur heard. Pulmonary:     Effort: Pulmonary effort is normal. No respiratory distress.     Breath sounds: No wheezing or rales.  Abdominal:     General: Abdomen is flat. There is no distension.     Palpations: Abdomen is soft.     Tenderness: There is no abdominal tenderness.  Musculoskeletal:        General: No swelling or deformity.  Skin:    General: Skin is warm and dry.  Neurological:     General: No focal deficit present.     ED Results / Procedures / Treatments   Labs (all labs ordered are listed, but only abnormal results are displayed) Labs Reviewed  RESP PANEL BY RT-PCR (RSV, FLU A&B, COVID)  RVPGX2 - Abnormal; Notable for the following components:      Result Value   SARS Coronavirus 2 by RT PCR POSITIVE (*)    All other components within normal limits  CBC WITH DIFFERENTIAL/PLATELET - Abnormal; Notable for the following components:   WBC 3.3 (*)    RBC 3.39 (*)    Hemoglobin 11.0 (*)    HCT 33.5 (*)    Platelets 67 (*)    Lymphs Abs 0.4 (*)    All other components within normal limits  COMPREHENSIVE METABOLIC PANEL - Abnormal; Notable for the following components:   Chloride 112 (*)    CO2 21 (*)    Calcium 7.8 (*)    Total Protein 5.5 (*)    Albumin 2.8 (*)    All other components within normal limits  MAGNESIUM - Abnormal; Notable for the following components:   Magnesium 1.6 (*)    All other components  within normal limits  CULTURE, BLOOD (ROUTINE X 2)  CULTURE, BLOOD (ROUTINE X 2)  URINALYSIS, ROUTINE W REFLEX MICROSCOPIC  RAPID URINE DRUG SCREEN, HOSP PERFORMED  CBG MONITORING, ED  CBG MONITORING, ED    EKG None  Radiology DG CHEST PORT 1 VIEW Result Date: 08/06/2023 CLINICAL DATA:  Syncope. EXAM: PORTABLE CHEST 1 VIEW COMPARISON:  X-ray 03/15/2023. FINDINGS: Elevation of the right hemidiaphragm. No consolidation, pneumothorax or  effusion. No edema. Normal cardiopericardial silhouette. Degenerative changes along the spine. Left upper chest pacemaker with leads along the right side of the heart. Overlapping cardiac leads. IMPRESSION: Elevated right hemidiaphragm.  Pacemaker. Electronically Signed   By: Karen Kays M.D.   On: 08/06/2023 14:47    Procedures Procedures   Medications Ordered in ED Medications  magnesium sulfate IVPB 2 g 50 mL (has no administration in time range)  sodium chloride 0.9 % bolus 500 mL (500 mLs Intravenous New Bag/Given 08/06/23 1418)    ED Course/ Medical Decision Making/ A&P Clinical Course as of 08/06/23 1548  Thu Aug 06, 2023  1217 Syncope with standing today. Wife has covid.  [CC]    Clinical Course User Index [CC] Glyn Ade, MD    Medical Decision Making Patient coming in after syncope at home. Differential includes orthostasis, cardiac dysrhythmia, primary neurologic event. Highest suspicion for orthostasis possibly exacerbated by viral infection given hypotension upon initial presentation.  His initial BP with EMS was in the 70 systolic but improved with him lying supine.  He is now status post 1 L fluid resuscitation and feeling better. PPM was interrogated without acute events.  ECG with ventricular paced rhythm similar in morphology when compared to tracing from Jan 2024.  Lab workup significant for pancytopenia with WBC count of 3.3, hemoglobin of 11.0, platelets of 67.  His viral testing is positive for COVID which likely  explains his pancytopenia. Given patient's medical complexity, failure to thrive, active COVID infection and risk for decompensation, recommend medical admission for observation and further workup of his syncope. Orthostatic vitals are pending.  Discussed care with [facility] who will see him for admission. They have requested CT Head, UDS, and blood cultures which I have ordered.   Amount and/or Complexity of Data Reviewed Labs: ordered. Radiology: ordered.  Risk Decision regarding hospitalization.  Final Clinical Impression(s) / ED Diagnoses Final diagnoses:  COVID  Failure to thrive in adult  Syncope and collapse    Rx / DC Orders ED Discharge Orders     None      J Dorothyann Gibbs, MD PGY-3 [facility] 08/06/23 3:49 PM    Alicia Amel, MD 08/06/23 1549    Glyn Ade, MD 08/07/23 [phone]

## 2023-08-07 ENCOUNTER — Observation Stay (HOSPITAL_COMMUNITY)

## 2023-08-07 ENCOUNTER — Other Ambulatory Visit (HOSPITAL_COMMUNITY)

## 2023-08-07 DIAGNOSIS — R569 Unspecified convulsions: Secondary | ICD-10-CM | POA: Diagnosis not present

## 2023-08-07 DIAGNOSIS — R55 Syncope and collapse: Secondary | ICD-10-CM

## 2023-08-07 LAB — COMPREHENSIVE METABOLIC PANEL
ALT: 18 U/L (ref 0–44)
AST: 22 U/L (ref 15–41)
Albumin: 3.1 g/dL — ABNORMAL LOW (ref 3.5–5.0)
Alkaline Phosphatase: 54 U/L (ref 38–126)
Anion gap: 4 — ABNORMAL LOW (ref 5–15)
BUN: 15 mg/dL (ref 8–23)
CO2: 26 mmol/L (ref 22–32)
Calcium: 8.4 mg/dL — ABNORMAL LOW (ref 8.9–10.3)
Chloride: 108 mmol/L (ref 98–111)
Creatinine, Ser: 0.91 mg/dL (ref 0.61–1.24)
GFR, Estimated: >60 mL/min (ref >60)
Glucose, Bld: 92 mg/dL (ref 70–99)
Potassium: 4.1 mmol/L (ref 3.5–5.1)
Sodium: 138 mmol/L (ref 135–145)
Total Bilirubin: 0.5 mg/dL (ref 0.0–1.2)
Total Protein: 6.1 g/dL — ABNORMAL LOW (ref 6.5–8.1)

## 2023-08-07 LAB — GLUCOSE, CAPILLARY
Glucose-Capillary: 91 mg/dL (ref 70–99)
Glucose-Capillary: 81 mg/dL (ref 70–99)

## 2023-08-07 LAB — CBC WITH DIFFERENTIAL/PLATELET
Abs Immature Granulocytes: 0.01 10*3/uL (ref 0.00–0.07)
Basophils Absolute: 0 10*3/uL (ref 0.0–0.1)
Basophils Relative: 1 %
Eosinophils Absolute: 0.1 10*3/uL (ref 0.0–0.5)
Eosinophils Relative: 2 %
HCT: 44.2 % (ref 39.0–52.0)
Hemoglobin: 15.2 g/dL (ref 13.0–17.0)
Immature Granulocytes: 0 %
Lymphocytes Relative: 23 %
Lymphs Abs: 0.7 10*3/uL (ref 0.7–4.0)
MCH: 32.3 pg (ref 26.0–34.0)
MCHC: 34.4 g/dL (ref 30.0–36.0)
MCV: 93.8 fL (ref 80.0–100.0)
Monocytes Absolute: 0.4 10*3/uL (ref 0.1–1.0)
Monocytes Relative: 12 %
Neutro Abs: 2.1 10*3/uL (ref 1.7–7.7)
Neutrophils Relative %: 62 %
Platelets: 116 10*3/uL — ABNORMAL LOW (ref 150–400)
RBC: 4.71 MIL/uL (ref 4.22–5.81)
RDW: 13.3 % (ref 11.5–15.5)
WBC: 3.3 10*3/uL — ABNORMAL LOW (ref 4.0–10.5)
nRBC: 0 % (ref 0.0–0.2)

## 2023-08-07 LAB — ECHOCARDIOGRAM COMPLETE
Height: 71 in
S' Lateral: 2.6 cm
Weight: 2543.23 [oz_av]

## 2023-08-07 LAB — PHOSPHORUS: Phosphorus: 2.4 mg/dL — ABNORMAL LOW (ref 2.5–4.6)

## 2023-08-07 LAB — D-DIMER, QUANTITATIVE: D-Dimer, Quant: 0.63 ug{FEU}/mL — ABNORMAL HIGH (ref 0.00–0.50)

## 2023-08-07 LAB — FERRITIN: Ferritin: 68 ng/mL (ref 24–336)

## 2023-08-07 LAB — HEMOGLOBIN A1C
Hgb A1c MFr Bld: 5.5 % (ref 4.8–5.6)
Mean Plasma Glucose: 111.15 mg/dL

## 2023-08-07 LAB — SEDIMENTATION RATE: Sed Rate: 18 mm/h — ABNORMAL HIGH (ref 0–16)

## 2023-08-07 LAB — MAGNESIUM: Magnesium: 2.1 mg/dL (ref 1.7–2.4)

## 2023-08-07 LAB — FIBRINOGEN: Fibrinogen: 535 mg/dL — ABNORMAL HIGH (ref 210–475)

## 2023-08-07 LAB — C-REACTIVE PROTEIN: CRP: 4.2 mg/dL — ABNORMAL HIGH (ref <1.0)

## 2023-08-07 LAB — TSH: TSH: 0.732 u[IU]/mL (ref 0.350–4.500)

## 2023-08-07 MED ORDER — K PHOS MONO-SOD PHOS DI & MONO 155-852-130 MG PO TABS
500.0000 mg | ORAL_TABLET | Freq: Four times a day (QID) | ORAL | Status: AC
  Administered 2023-08-07: 500 mg via ORAL
  Filled 2023-08-07: qty 2

## 2023-08-07 MED ORDER — ALLOPURINOL 100 MG PO TABS: 100.0000 mg | ORAL_TABLET | Freq: Every day | ORAL | 2 refills | Status: DC

## 2023-08-07 NOTE — Procedures (Signed)
 Patient Name: Dustin Hamilton  MRN: [id number]  Epilepsy Attending: Charlsie Quest  Referring Physician/Provider: Merlene Laughter, DO  Date: 08/07/2023 Duration: 23.08 mins  Patient history: 83yo M with syncope. EEG to evaluate for seizure  Level of alertness: Awake  AEDs during EEG study: None  Technical aspects: This EEG study was done with scalp electrodes positioned according to the 10-20 International system of electrode placement. Electrical activity was reviewed with band pass filter of 1-70Hz , sensitivity of 7 uV/mm, display speed of 42mm/sec with a 60Hz  notched filter applied as appropriate. EEG data were recorded continuously and digitally stored.  Video monitoring was available and reviewed as appropriate.  Description: The posterior dominant rhythm consists of8 Hz activity of moderate voltage (25-35 uV) seen predominantly in posterior head regions, symmetric and reactive to eye opening and eye closing. EEG showed intermittent generalized 3 to 6 Hz theta-delta slowing. Hyperventilation and photic stimulation were not performed.     ABNORMALITY - Intermittent slow, generalized  IMPRESSION: This study is suggestive of mild diffuse encephalopathy. No seizures or epileptiform discharges were seen throughout the recording.  [name]

## 2023-08-07 NOTE — Evaluation (Signed)
 Physical Therapy Evaluation Patient Details Name: Dustin Hamilton MRN: [id number] DOB: 11-Dec-1940 Today's Date: 08/07/2023  History of Present Illness  Pt is an 83 y/o male presenting after a syncopal episode at home. Found to be hypotensive and COVID+. PMH: HTN, glaucoma, CVA, vascular dementia, C diff, heart block s/p PPM  Clinical Impression  Patient presents with mobility close to baseline.  Does demonstrate difficulty with vision deficits needing cues and occasional assistance to maneuver around obstacles and needing cues for foot clearance as shuffling at times.  Did also c/o back pain with initial sit to stand needing to sit prior to proceeding.  Improved with walker use.  Feel stable for home and likely no follow up PT needs. Will follow up if not d/c.         If plan is discharge home, recommend the following: Supervision due to cognitive status;Assist for transportation;Assistance with cooking/housework   Can travel by private vehicle        Equipment Recommendations None recommended by PT  Recommendations for Other Services       Functional Status Assessment Patient has had a recent decline in their functional status and demonstrates the ability to make significant improvements in function in a reasonable and predictable amount of time.     Precautions / Restrictions Precautions Precautions: Fall Precaution/Restrictions Comments: visually impaired      Mobility  Bed Mobility               General bed mobility comments: in recliner upon entry    Transfers Overall transfer level: Needs assistance Equipment used: Rolling walker (2 wheels) Transfers: Sit to/from Stand Sit to Stand: Min assist           General transfer comment: Min A to stand from low surface (pt reports bed at home higher)    Ambulation/Gait Ambulation/Gait assistance: Supervision, Contact guard assist Gait Distance (Feet): 180 Feet Assistive device: Rolling walker (2 wheels) Gait  Pattern/deviations: Step-through pattern, Step-to pattern, Decreased stride length, Shuffle       General Gait Details: cues and occasional assist to get around obstacles; cues for step height/foot clearance  Stairs            Wheelchair Mobility     Tilt Bed    Modified Rankin (Stroke Patients Only)       Balance Overall balance assessment: Needs assistance   Sitting balance-Leahy Scale: Good     Standing balance support: Bilateral upper extremity supported, Reliant on assistive device for balance Standing balance-Leahy Scale: Poor                               Pertinent Vitals/Pain Pain Assessment Pain Assessment: 0-10 Pain Score: 8  Pain Location: back with mobility Pain Descriptors / Indicators: Aching, Discomfort, Grimacing Pain Intervention(s): Monitored during session, Repositioned    Home Living Family/patient expects to be discharged to:: Private residence Living Arrangements: Spouse/significant other Available Help at Discharge: Family;Personal care attendant;Available 24 hours/day Type of Home: House Home Access: Ramped entrance     Alternate Level Stairs-Number of Steps: flight Home Layout: Two level;Full bath on main level Home Equipment: Educational psychologist (4 wheels);Cane - single point Additional Comments: aide x3 hours in the AM for 5 days/week    Prior Function Prior Level of Function : Needs assist             Mobility Comments: uses rollator for mobility ADLs Comments: assist with bathing  tasks, wears briefs at baseline. pt reports wife will assist with dressing as needed. Aide/family assist with IADLs     Extremity/Trunk Assessment        Lower Extremity Assessment Lower Extremity Assessment: Overall WFL for tasks assessed    Cervical / Trunk Assessment Cervical / Trunk Assessment: Normal  Communication   Communication Communication: No apparent difficulties    Cognition Arousal: Alert Behavior During  Therapy: WFL for tasks assessed/performed   PT - Cognitive impairments: No family/caregiver present to determine baseline                       PT - Cognition Comments: appropriate for mobility, though some hesitation on questions and changing answers about if he does IADL versus his wife Following commands: Intact       Cueing Cueing Techniques: Verbal cues, Tactile cues     General Comments General comments (skin integrity, edema, etc.): BP seated 156/91, standing 120/90, after ambulation 156/91, HR 73, SpO2 96% on RA.    Exercises     Assessment/Plan    PT Assessment Patient needs continued PT services  PT Problem List Decreased mobility;Decreased activity tolerance;Decreased balance       PT Treatment Interventions DME instruction;Gait training;Stair training;Functional mobility training;Therapeutic activities;Balance training;Therapeutic exercise    PT Goals (Current goals can be found in the Care Plan section)  Acute Rehab PT Goals Patient Stated Goal: return home PT Goal Formulation: With patient Time For Goal Achievement: 08/21/23 Potential to Achieve Goals: Good    Frequency Min 2X/week     Co-evaluation               AM-PAC PT "6 Clicks" Mobility  Outcome Measure Help needed turning from your back to your side while in a flat bed without using bedrails?: A Little Help needed moving from lying on your back to sitting on the side of a flat bed without using bedrails?: A Little Help needed moving to and from a bed to a chair (including a wheelchair)?: A Little Help needed standing up from a chair using your arms (e.g., wheelchair or bedside chair)?: A Little Help needed to walk in hospital room?: A Little Help needed climbing 3-5 steps with a railing? : A Little 6 Click Score: 18    End of Session Equipment Utilized During Treatment: Gait belt Activity Tolerance: Patient tolerated treatment well Patient left: in chair;with call bell/phone  within reach;with chair alarm set   PT Visit Diagnosis: Muscle weakness (generalized) (M62.81)    Time: 9604-5409 PT Time Calculation (min) (ACUTE ONLY): 23 min   Charges:   PT Evaluation $PT Eval Low Complexity: 1 Low PT Treatments $Gait Training: 8-22 mins PT General Charges $$ ACUTE PT VISIT: 1 Visit         Sheran Lawless, PT Acute Rehabilitation Services Office:[phone] 08/07/2023   Elray Mcgregor 08/07/2023, 1:17 PM

## 2023-08-07 NOTE — Progress Notes (Signed)
 Not able to perform EEG this time due to time constraint, day shift will be made aware of standing order.

## 2023-08-07 NOTE — TOC CM/SW Note (Addendum)
 Transition of Care [facility]) - Inpatient Brief Assessment   Patient Details  Name: Dustin Hamilton MRN: [id number] Date of Birth: 1941-04-11  [facility] CM/SW Contact:    Jessie Foot, RN Phone Number: 08/07/2023, 4:07 PM   Clinical Narrative: MOON completed. Presented for syncope via EMS. Lives with spouse in single family home. [facility] [place] and [facility] with [facility]. Has DME (cane, rollator, BS) and is active with HH. Patient not sure of agency. Transportation home will be with family via private vehicle. Case Manager signing off. Patient for discharge.   Transition of Care Asessment:   Patient has primary care physician: Yes ([facility] [place]) Home environment has been reviewed: Live in home with wife Prior level of function:: Independent with Rollator Prior/Current Home Services: Current home services ([facility]. Patient not aware)     Transition of care needs: no transition of care needs at this time

## 2023-08-07 NOTE — Discharge Summary (Signed)
 [facility]  Physician Discharge Summary   Patient ID: Dustin Hamilton MRN: [id number] DOB/AGE: Aug 04, 1940 83 y.o.  Admit date: 08/06/2023 Discharge date:   08/07/2023   PCP: [facility]  DISCHARGE DIAGNOSES:    Syncope secondary to orthostatic hypotension Dehydration due to Covid 19 Covid 19    RECOMMENDATIONS FOR OUTPATIENT FOLLOW UP: Patient and his wife told that he needs to stay well hydrated  Home Health:None  Equipment/Devices:None   CODE STATUS:Full   DISCHARGE CONDITION: fair  Diet recommendation: As before  INITIAL HISTORY: 83 y.o. 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  Patient is pleasantly confused and is a poor historian so a reliable history is not able to be obtained from the patient so most of the history was obtained from the EDP discussion and discussion with the patient's wife.  Patient was in his usual state of health this morning and his home health nurse came out to evaluate and help him.  His wife has been feeling ill and she tested positive for COVID and patient was going to get up and ambulate with the assistance of the home health aide.  The home health aide got the patient up and he told her that he was feeling weak so he sat back down and his rolling walker and she began wearing him to the end of the hallway he then subsequently slumped over and hung his head would not respond to physical verbal stimuli and this lasted about 5 minutes and subsequently came around.  By the time EMS came they found his blood pressure to be in his 70s systolic.  He received a 500 mL bolus and reported no issues and does not really recollect what happened and cannot explain to  me what happened either.  Denies any chest pain, shortness breath, denies any cough.  He does have a history of chronic dizzy spells but he denied any today.  Upon arrival he did have a large loose stool in the ED.  Subsequently patient also tested positive for COVID-19.  [facility] was asked to admit this patient for syncope and evaluation of COVID-19.     HOSPITAL COURSE:   Syncope: Most likely due to orthostatic hypotension which he apparently has a history of based on H&P. Covid 19 also contributed. Patient is back to baseline. Mildly elevated d dimer of no clinical significance since he otherwise is stable and asymptomatic. He ambulated with PT and felt well. Wants to go home. Do not suspect cardiac etiology. No arrhythmias on telemetry. ECHO shows normal LVEF. No significant valvular abnormalities. TSH is normal. EEG unremarkable. Ok for discharge. Discussed with his wife.   COVID 19: Early asymptomatic and not coughing or complaining of shortness of breath.  No further work up. No indication for antivirals or steroids.    HypoMag: Supplemented   Hx of CVA and Vascular Dementia:    Glaucoma   History of BPH s/p prostatic artery embolization:   CHB s/p PPM and SVT s/p AVNRT Ablation:    Panyctopenia    PERTINENT LABS:  The results of significant diagnostics from this hospitalization (including imaging, microbiology, ancillary and laboratory) are listed below for reference.    Microbiology: Recent Results (from the past 240 hours)  Resp panel by RT-PCR (RSV, Flu  A&B, Covid) Anterior Nasal Swab     Status: Abnormal   Collection Time: 08/06/23 12:09 PM   Specimen: Anterior Nasal Swab  Result Value Ref Range Status   SARS Coronavirus 2 by RT PCR POSITIVE (A) NEGATIVE Final   Influenza A by PCR NEGATIVE NEGATIVE Final   Influenza B by PCR NEGATIVE NEGATIVE Final    Comment: (NOTE) The Xpert Xpress SARS-CoV-2/FLU/RSV plus assay is intended as an aid in the diagnosis of influenza from  Nasopharyngeal swab specimens and should not be used as a sole basis for treatment. Nasal washings and aspirates are unacceptable for Xpert Xpress SARS-CoV-2/FLU/RSV testing.  Fact Sheet for Patients: BloggerCourse.com  Fact Sheet for Healthcare Providers: SeriousBroker.it  This test is not yet approved or cleared by the [place] FDA and has been authorized for detection and/or diagnosis of SARS-CoV-2 by FDA under an Emergency Use Authorization (EUA). This EUA will remain in effect (meaning this test can be used) for the duration of the COVID-19 declaration under Section 564(b)(1) of the Act, 21 U.S.C. section 360bbb-3(b)(1), unless the authorization is terminated or revoked.     Resp Syncytial Virus by PCR NEGATIVE NEGATIVE Final    Comment: (NOTE) Fact Sheet for Patients: BloggerCourse.com  Fact Sheet for Healthcare Providers: SeriousBroker.it  This test is not yet approved or cleared by the [place] FDA and has been authorized for detection and/or diagnosis of SARS-CoV-2 by FDA under an Emergency Use Authorization (EUA). This EUA will remain in effect (meaning this test can be used) for the duration of the COVID-19 declaration under Section 564(b)(1) of the Act, 21 U.S.C. section 360bbb-3(b)(1), unless the authorization is terminated or revoked.  Performed at [facility], [address], [place], [place] [address]   Culture, blood (Routine X 2) w Reflex to ID Panel     Status: None (Preliminary result)   Collection Time: 08/06/23  6:33 PM   Specimen: BLOOD  Result Value Ref Range Status   Specimen Description BLOOD SITE NOT SPECIFIED  Final   Special Requests   Final    BOTTLES DRAWN AEROBIC AND ANAEROBIC Blood Culture results may not be optimal due to an inadequate volume of blood received in culture bottles   Culture   Final    NO GROWTH < 24  HOURS Performed at [facility], [address], [place], [place] [address]    Report Status PENDING  Incomplete  Culture, blood (Routine X 2) w Reflex to ID Panel     Status: None (Preliminary result)   Collection Time: 08/06/23  6:33 PM   Specimen: BLOOD  Result Value Ref Range Status   Specimen Description BLOOD SITE NOT SPECIFIED  Final   Special Requests   Final    BOTTLES DRAWN AEROBIC AND ANAEROBIC Blood Culture results may not be optimal due to an inadequate volume of blood received in culture bottles   Culture   Final    NO GROWTH < 24 HOURS Performed at [facility], [address] [place], [place] [address]    Report Status PENDING  Incomplete     Labs:   Basic Metabolic Panel: Recent Labs  Lab 08/06/23 1209 08/06/23 2021 08/07/23 0532  NA 139  --  138  K 3.6  --  4.1  CL 112*  --  108  CO2 21*  --  26  GLUCOSE 90  --  92  BUN 12  --  15  CREATININE 1.06 1.01 0.91  CALCIUM 7.8*  --  8.4*  MG 1.6*  --  2.1  PHOS  --   --  2.4*   Liver Function Tests: Recent Labs  Lab 08/06/23 1209 08/07/23 0532  AST 21 22  ALT 17 18  ALKPHOS 50 54  BILITOT 0.6 0.5  PROT 5.5* 6.1*  ALBUMIN 2.8* 3.1*   No results for input(s): "LIPASE", "AMYLASE" in the last 168 hours. No results for input(s): "AMMONIA" in the last 168 hours. CBC: Recent Labs  Lab 08/06/23 1209 08/06/23 2119 08/07/23 0532  WBC 3.3* 3.7* 3.3*  NEUTROABS 2.6  --  2.1  HGB 11.0* 16.0 15.2  HCT 33.5* 46.3 44.2  MCV 98.8 93.2 93.8  PLT 67* 104* 116*     CBG: Recent Labs  Lab 08/06/23 1133 08/07/23 0527 08/07/23 1015  GLUCAP 92 91 81     IMAGING STUDIES ECHOCARDIOGRAM COMPLETE Result Date: 08/07/2023    ECHOCARDIOGRAM REPORT   Patient Name:   Dustin Hamilton Date of Exam: 08/07/2023 Medical Rec #:  [id number]          Height:       71.0 in Accession #:    [id number]         Weight:       159.0 lb Date of Birth:  07/23/1940          BSA:          1.912 m Patient Age:    82  years           BP:           132/88 mmHg Patient Gender: M                  HR:           70 bpm. Exam Location:  Inpatient Procedure: 2D Echo, Cardiac Doppler and Color Doppler (Both Spectral and Color            Flow Doppler were utilized during procedure). Indications:    Syncope R55  History:        Patient has prior history of Echocardiogram examinations, most                 recent 03/15/2023. Stroke; Risk Factors:Hypertension.  Sonographer:    Darlys Gales Referring Phys: [id number] [facility]  Sonographer Comments: Technically difficult study due to poor echo windows and no apical window. IMPRESSIONS  1. Left ventricular ejection fraction, by estimation, is 50 to 55%. The left ventricle has low normal function. The left ventricle has no regional wall motion abnormalities. There is mild left ventricular hypertrophy. Left ventricular diastolic parameters are indeterminate.  2. Right ventricular systolic function is normal. The right ventricular size is normal.  3. The mitral valve is abnormal. Trivial mitral valve regurgitation. No evidence of mitral stenosis.  4. The aortic valve is tricuspid. There is moderate calcification of the aortic valve. There is moderate thickening of the aortic valve. Aortic valve regurgitation is not visualized. Aortic valve sclerosis/calcification is present, without any evidence of aortic stenosis.  5. The inferior vena cava is normal in size with greater than 50% respiratory variability, suggesting right atrial pressure of 3 mmHg. FINDINGS  Left Ventricle: Left ventricular ejection fraction, by estimation, is 50 to 55%. The left ventricle has low normal function. The left ventricle has no regional wall motion abnormalities. Strain was performed and the global longitudinal strain is indeterminate. The left ventricular internal cavity size was normal in size. There  is mild left ventricular hypertrophy. Left ventricular diastolic parameters are indeterminate. Right Ventricle:  The right ventricular size is normal. No increase in right ventricular wall thickness. Right ventricular systolic function is normal. Left Atrium: Left atrial size was normal in size. Right Atrium: Right atrial size was normal in size. Pericardium: Trivial pericardial effusion is present. The pericardial effusion is anterior to the right ventricle. Mitral Valve: The mitral valve is abnormal. There is mild thickening of the mitral valve leaflet(s). There is mild calcification of the mitral valve leaflet(s). Trivial mitral valve regurgitation. No evidence of mitral valve stenosis. Tricuspid Valve: The tricuspid valve is normal in structure. Tricuspid valve regurgitation is mild . No evidence of tricuspid stenosis. Aortic Valve: The aortic valve is tricuspid. There is moderate calcification of the aortic valve. There is moderate thickening of the aortic valve. Aortic valve regurgitation is not visualized. Aortic valve sclerosis/calcification is present, without any  evidence of aortic stenosis. Pulmonic Valve: The pulmonic valve was normal in structure. Pulmonic valve regurgitation is not visualized. No evidence of pulmonic stenosis. Aorta: The aortic root is normal in size and structure. Venous: The inferior vena cava is normal in size with greater than 50% respiratory variability, suggesting right atrial pressure of 3 mmHg. IAS/Shunts: The interatrial septum was not well visualized. Additional Comments: 3D was performed not requiring image post processing on an independent workstation and was indeterminate.  LEFT VENTRICLE PLAX 2D LVIDd:         3.80 cm LVIDs:         2.60 cm LV PW:         1.30 cm LV IVS:        1.10 cm LVOT diam:     2.00 cm LVOT Area:     3.14 cm  RIGHT VENTRICLE RV S prime:     10.00 cm/s TAPSE (M-mode): 1.2 cm RIGHT ATRIUM           Index RA Area:     10.50 cm RA Volume:   24.60 ml  12.86 ml/m   AORTA Ao Root diam: 3.10 cm  SHUNTS Systemic Diam: 2.00 cm Charlton Haws MD Electronically signed  by Charlton Haws MD Signature Date/Time: 08/07/2023/3:33:28 PM    Final    EEG adult Result Date: 08/07/2023 Charlsie Quest, MD     08/07/2023 10:18 AM Patient Name: Dustin Hamilton MRN: [id number] Epilepsy Attending: Charlsie Quest Referring Physician/Provider: Merlene Laughter, DO Date: 08/07/2023 Duration: 23.08 mins Patient history: 83yo M with syncope. EEG to evaluate for seizure Level of alertness: Awake AEDs during EEG study: None Technical aspects: This EEG study was done with scalp electrodes positioned according to the 10-20 International system of electrode placement. Electrical activity was reviewed with band pass filter of 1-70Hz , sensitivity of 7 uV/mm, display speed of 39mm/sec with a 60Hz  notched filter applied as appropriate. EEG data were recorded continuously and digitally stored.  Video monitoring was available and reviewed as appropriate. Description: The posterior dominant rhythm consists of8 Hz activity of moderate voltage (25-35 uV) seen predominantly in posterior head regions, symmetric and reactive to eye opening and eye closing. EEG showed intermittent generalized 3 to 6 Hz theta-delta slowing. Hyperventilation and photic stimulation were not performed.   ABNORMALITY - Intermittent slow, generalized IMPRESSION: This study is suggestive of mild diffuse encephalopathy. No seizures or epileptiform discharges were seen throughout the recording. Priyanka [name]   CT HEAD WO CONTRAST ( ) Result Date: 08/06/2023 CLINICAL DATA:  Syncopal event. EXAM: CT  HEAD WITHOUT CONTRAST TECHNIQUE: Contiguous axial images were obtained from the base of the skull through the vertex without intravenous contrast. RADIATION DOSE REDUCTION: This exam was performed according to the departmental dose-optimization program which includes automated exposure control, adjustment of the mA and/or kV according to patient size and/or use of iterative reconstruction technique. COMPARISON:  March 17, 2023 FINDINGS:  Brain: There is generalized cerebral atrophy with widening of the extra-axial spaces and stable ventricular dilatation. There are areas of decreased attenuation within the white matter tracts of the supratentorial brain, consistent with microvascular disease changes. A chronic left occipital lobe infarct is seen. Vascular: Mild to moderate severity calcification of the bilateral cavernous carotid arteries is noted. Skull: Normal. Negative for fracture or focal lesion. Sinuses/Orbits: No acute finding. Other: None. IMPRESSION: 1. Generalized cerebral atrophy and microvascular disease changes of the supratentorial brain. 2. Chronic left occipital lobe infarct. 3. No acute intracranial abnormality. Electronically Signed   By: Aram Candela M.D.   On: 08/06/2023 19:24   DG CHEST PORT 1 VIEW Result Date: 08/06/2023 CLINICAL DATA:  Syncope. EXAM: PORTABLE CHEST 1 VIEW COMPARISON:  X-ray 03/15/2023. FINDINGS: Elevation of the right hemidiaphragm. No consolidation, pneumothorax or effusion. No edema. Normal cardiopericardial silhouette. Degenerative changes along the spine. Left upper chest pacemaker with leads along the right side of the heart. Overlapping cardiac leads. IMPRESSION: Elevated right hemidiaphragm.  Pacemaker. Electronically Signed   By: Karen Kays M.D.   On: 08/06/2023 14:47    DISCHARGE EXAMINATION: Vitals:   08/06/23 2105 08/07/23 0001 08/07/23 0411 08/07/23 0500  BP: (!) 161/90 118/75 132/88   Pulse: 60 60 68   Resp: 15 16 14    Temp: 99.3 F (37.4 C) 99 F (37.2 C) 97.6 F (36.4 C)   TempSrc: Oral Oral Oral   SpO2: 96% 97% 99%   Weight: 71.2 kg   72.1 kg  Height:       General appearance: alert, cooperative, appears stated age, and no distress Resp: clear to auscultation bilaterally Cardio: regular rate and rhythm, S1, S2 normal, no murmur, click, rub or gallop  DISPOSITION: Home  Discharge Instructions     Call MD for:  difficulty breathing, headache or visual  disturbances   Complete by: As directed    Call MD for:  extreme fatigue   Complete by: As directed    Call MD for:  persistant dizziness or light-headedness   Complete by: As directed    Call MD for:  persistant nausea and vomiting   Complete by: As directed    Call MD for:  severe uncontrolled pain   Complete by: As directed    Call MD for:  temperature >100.4   Complete by: As directed    Diet - low sodium heart healthy   Complete by: As directed    Discharge instructions   Complete by: As directed    Please be sure to follow-up with your primary care provider.    You were cared for by a hospitalist during your hospital stay. If you have any questions about your discharge medications or the care you received while you were in the hospital after you are discharged, you can call the unit and asked to speak with the hospitalist on call if the hospitalist that took care of you is not available. Once you are discharged, your primary care physician will handle any further medical issues. Please note that NO REFILLS for any discharge medications will be authorized once you are discharged, as it  is imperative that you return to your primary care physician (or establish a relationship with a primary care physician if you do not have one) for your aftercare needs so that they can reassess your need for medications and monitor your lab values. If you do not have a primary care physician, you can call [phone] for a physician referral.   Increase activity slowly   Complete by: As directed         Allergies as of 08/07/2023       Reactions   Penicillins Hives   Levetiracetam Other (See Comments)   Altered mental status   Aricept [donepezil] Other (See Comments)   Dizziness   Exelon [rivastigmine] Other (See Comments)   Malaise   Viagra [sildenafil] Palpitations, Other (See Comments)   Dizziness, also        Medication List     STOP taking these medications    amLODipine 10 MG  tablet Commonly known as: NORVASC   metoprolol succinate 25 MG 24 hr tablet Commonly known as: TOPROL-XL       TAKE these medications    acetaminophen 325 MG tablet Commonly known as: TYLENOL Take 2 tablets (650 mg total) by mouth every 6 (six) hours as needed for mild pain (pain score 1-3) (or Fever >/= 101).   allopurinol 100 MG tablet Commonly known as: Zyloprim Take 1 tablet (100 mg total) by mouth daily.   aspirin 81 MG chewable tablet Chew 1 tablet (81 mg total) by mouth daily.   brimonidine 0.2 % ophthalmic solution Commonly known as: ALPHAGAN Place 1 drop into both eyes 2 (two) times daily.   dorzolamide 2 % ophthalmic solution Commonly known as: TRUSOPT Place 1 drop into both eyes 2 (two) times daily.   dutasteride 0.5 MG capsule Commonly known as: AVODART Take 0.5 mg by mouth in the morning.   latanoprost 0.005 % ophthalmic solution Commonly known as: XALATAN Place 1 drop into both eyes at bedtime.   memantine 10 MG tablet Commonly known as: NAMENDA Take 10 mg by mouth 2 (two) times daily.          Follow-up Information     [facility] Follow up.   Why: Set up a follow up with your [facility] team Contact information: [address] [facility] [place] [address] [phone]                 TOTAL DISCHARGE TIME: 35 mins  Adri Schloss Rito Ehrlich  [facility] Pager on [email or web address]  08/07/2023, 5:55 PM

## 2023-08-07 NOTE — TOC Transition Note (Signed)
 Transition of Care [facility]) - Discharge Note   Patient Details  Name: Dustin Hamilton MRN: [id number] Date of Birth: [birth year]  [facility] CM/SW Contact:  Jessie Foot, RN Phone Number: 08/07/2023, 4:13 PM   Clinical Narrative:    Patient will DC to: Home with family Anticipated DC date: 08/07/2023 Family notified: [name] (spouse) Transport by: Sales executive   Per MD patient ready for DC to home. RN, patient,and patient's family, notified of DC.  Case Manager will sign off for now as no needs identified. Please consult [place] again if new needs arise.     Final next level of care: Home w Home Health Services (per patient) Barriers to Discharge: No Barriers Identified   Patient Goals and CMS Choice     Choice offered to / list presented to : NA      Discharge Placement                  Name of family member notified: [name] (spouse) Patient and family notified of of transfer: 08/07/23  Discharge Plan and Services Additional resources added to the After Visit Summary for                  DME Arranged: N/A         HH Arranged: NA          Social Drivers of Health (SDOH) Interventions SDOH Screenings   Food Insecurity: No Food Insecurity (08/06/2023)  Housing: Low Risk  (08/06/2023)  Transportation Needs: No Transportation Needs (08/06/2023)  Utilities: Not At Risk (08/06/2023)  Social Connections: Socially Integrated (08/06/2023)  Tobacco Use: Medium Risk (08/06/2023)     Readmission Risk Interventions     No data to display

## 2023-08-07 NOTE — Evaluation (Signed)
 Occupational Therapy Evaluation Patient Details Name: Dustin Hamilton MRN: [id number] DOB: 11-29-1940 Today's Date: 08/07/2023   History of Present Illness   Pt is an 83 y/o male presenting after a syncopal episode at home. Found to be hypotensive and COVID+. PMH: HTN, glaucoma, CVA, vascular dementia, C diff, heart block s/p PPM     Clinical Impressions PTA, pt lives with spouse, ambulatory with Rollator and has caregiver assist 5 days/week for showering tasks and IADLs. Pt presents now fairly close to baseline, requiring Min A for RW use during mobility and overall Min A for LB ADLs. Pt pleasant, follows all directions and eager to return home once medically stable. Assisted to call spouse at end of session w/ adequate support confirmed. As pt close to baseline, anticipate no OT needs upon DC but will follow acutely if pt to remain admitted.  BP 160s/90s post activity, SpO2 > 95% on RA     If plan is discharge home, recommend the following:   A little help with walking and/or transfers;A little help with bathing/dressing/bathroom;Assistance with cooking/housework;Direct supervision/assist for medications management;Direct supervision/assist for financial management;Supervision due to cognitive status;Assist for transportation     Functional Status Assessment   Patient has not had a recent decline in their functional status (fairly close to baseline)     Equipment Recommendations   None recommended by OT     Recommendations for Other Services         Precautions/Restrictions   Precautions Precautions: Fall Restrictions Weight Bearing Restrictions Per Provider Order: No     Mobility Bed Mobility Overal bed mobility: Needs Assistance Bed Mobility: Supine to Sit     Supine to sit: Min assist, HOB elevated, Used rails     General bed mobility comments: Min A to initiate LE to EOB but once moving, pt able to lift trunk easily    Transfers Overall transfer  level: Needs assistance Equipment used: Rolling walker (2 wheels) Transfers: Sit to/from Stand Sit to Stand: Min assist           General transfer comment: Min A to stand from low surface (pt reports bed at home higher)      Balance Overall balance assessment: Needs assistance Sitting-balance support: No upper extremity supported, Feet supported Sitting balance-Leahy Scale: Good     Standing balance support: Bilateral upper extremity supported, During functional activity Standing balance-Leahy Scale: Poor                             ADL either performed or assessed with clinical judgement   ADL Overall ADL's : Needs assistance/impaired Eating/Feeding: Set up;Sitting   Grooming: Set up;Sitting;Wash/dry face   Upper Body Bathing: Supervision/ safety;Sitting   Lower Body Bathing: Minimal assistance;Sitting/lateral leans;Sit to/from stand   Upper Body Dressing : Supervision/safety;Sitting   Lower Body Dressing: Minimal assistance;Sit to/from stand;Sitting/lateral leans   Toilet Transfer: Minimal assistance;Ambulation;Rolling walker (2 wheels)   Toileting- Clothing Manipulation and Hygiene: Sitting/lateral lean;Sit to/from stand;Moderate assistance       Functional mobility during ADLs: Minimal assistance;Rolling walker (2 wheels);Cueing for sequencing;Cueing for safety General ADL Comments: Cues for sequencing DME use w/ line mgmt needed. appears fairly close to baseline with adequate caregiver and family support at home.     Vision Ability to See in Adequate Light: 0 Adequate Patient Visual Report: No change from baseline Vision Assessment?: No apparent visual deficits     Perception  Praxis         Pertinent Vitals/Pain Pain Assessment Pain Assessment: No/denies pain     Extremity/Trunk Assessment Upper Extremity Assessment Upper Extremity Assessment: Overall WFL for tasks assessed;Right hand dominant   Lower Extremity  Assessment Lower Extremity Assessment: Defer to PT evaluation   Cervical / Trunk Assessment Cervical / Trunk Assessment: Normal   Communication Communication Communication: No apparent difficulties   Cognition Arousal: Alert Behavior During Therapy: WFL for tasks assessed/performed Cognition: History of cognitive impairments             OT - Cognition Comments: hx of vascular dementia, very pleasant. follows all basic commands. memory deficits (short term and long term) d/t dementia                 Following commands: Intact       Cueing  General Comments   Cueing Techniques: Verbal cues;Gestural cues;Tactile cues  BP 160s/90s post activity, denied dizziness. HR 80-90s, SpO2 > 95%   Exercises     Shoulder Instructions      Home Living Family/patient expects to be discharged to:: Private residence Living Arrangements: Spouse/significant other Available Help at Discharge: Family;Personal care attendant;Available 24 hours/day Type of Home: House Home Access: Ramped entrance     Home Layout: Two level;Full bath on main level Alternate Level Stairs-Number of Steps: flight   Bathroom Shower/Tub: Producer, television/film/video: Standard Bathroom Accessibility: Yes   Home Equipment: Educational psychologist (4 wheels);Cane - single point   Additional Comments: aide x3 hours in the AM for 5 days/week      Prior Functioning/Environment Prior Level of Function : Needs assist             Mobility Comments: uses rollator for mobility ADLs Comments: assist with bathing tasks, wears briefs at baseline. pt reports wife will assist with dressing as needed. Aide/family assist with IADLs    OT Problem List: Decreased strength;Impaired balance (sitting and/or standing);Decreased activity tolerance;Decreased cognition   OT Treatment/Interventions: Self-care/ADL training;Therapeutic exercise;DME and/or AE instruction;Therapeutic activities;Patient/family  education;Balance training      OT Goals(Current goals can be found in the care plan section)   Acute Rehab OT Goals Patient Stated Goal: home soon OT Goal Formulation: With patient/family Time For Goal Achievement: 08/21/23 Potential to Achieve Goals: Good ADL Goals Pt Will Perform Grooming: with set-up;standing Pt Will Transfer to Toilet: with set-up;ambulating Additional ADL Goal #1: Pt to increase standing activity tolerance > 10 min during ADLs/mobility   OT Frequency:  Min 1X/week    Co-evaluation              AM-PAC OT "6 Clicks" Daily Activity     Outcome Measure Help from another person eating meals?: A Little Help from another person taking care of personal grooming?: A Little Help from another person toileting, which includes using toliet, bedpan, or urinal?: A Lot Help from another person bathing (including washing, rinsing, drying)?: A Little Help from another person to put on and taking off regular upper body clothing?: A Little Help from another person to put on and taking off regular lower body clothing?: A Little 6 Click Score: 17   End of Session Equipment Utilized During Treatment: Gait belt;Rolling walker (2 wheels) Nurse Communication: Mobility status  Activity Tolerance: Patient tolerated treatment well Patient left: in chair;with call bell/phone within reach;with chair alarm set  OT Visit Diagnosis: Other abnormalities of gait and mobility (R26.89);Other symptoms and signs involving cognitive function  Time: 8119-1478 OT Time Calculation (min): 39 min Charges:  OT General Charges $OT Visit: 1 Visit OT Evaluation $OT Eval Moderate Complexity: 1 Mod OT Treatments $Self Care/Home Management : 8-22 mins  Bradd Canary, OTR/L Acute Rehab Services Office: [phone]   Lorre Munroe 08/07/2023, 8:09 AM

## 2023-08-07 NOTE — Progress Notes (Signed)
 EEG complete - results pending

## 2023-08-07 NOTE — Progress Notes (Signed)
 Family at bedside to take patient home at this time

## 2023-08-07 NOTE — Care Management Obs Status (Signed)
 MEDICARE OBSERVATION STATUS NOTIFICATION   Patient Details  Name: KHAMERON GRUENWALD MRN: [id number] Date of Birth: [birth year]   Medicare Observation Status Notification Given:  Yes    Lockie Pares, RN 08/07/2023, 4:15 PM

## 2023-08-07 NOTE — Plan of Care (Signed)
   Problem: Education: Goal: Knowledge of risk factors and measures for prevention of condition will improve Outcome: Progressing   Problem: Coping: Goal: Psychosocial and spiritual needs will be supported Outcome: Progressing   Problem: Respiratory: Goal: Will maintain a patent airway Outcome: Progressing Goal: Complications related to the disease process, condition or treatment will be avoided or minimized Outcome: Progressing

## 2023-08-11 LAB — CULTURE, BLOOD (ROUTINE X 2)
Culture: NO GROWTH
Culture: NO GROWTH

## 2023-08-12 NOTE — Progress Notes (Signed)
 Remote pacemaker transmission.

## 2023-09-12 ENCOUNTER — Emergency Department (HOSPITAL_COMMUNITY)
Admission: EM | Admit: 2023-09-12 | Discharge: 2023-09-13 | Disposition: A | Discharge status: Home / Self Care | Attending: Emergency Medicine | Admitting: Emergency Medicine

## 2023-09-12 ENCOUNTER — Other Ambulatory Visit: Payer: Self-pay

## 2023-09-12 ENCOUNTER — Encounter (HOSPITAL_COMMUNITY): Payer: Self-pay

## 2023-09-12 ENCOUNTER — Emergency Department (HOSPITAL_COMMUNITY)

## 2023-09-12 DIAGNOSIS — R410 Disorientation, unspecified: Secondary | ICD-10-CM | POA: Diagnosis not present

## 2023-09-12 DIAGNOSIS — I10 Essential (primary) hypertension: Secondary | ICD-10-CM | POA: Diagnosis not present

## 2023-09-12 DIAGNOSIS — Z7982 Long term (current) use of aspirin: Secondary | ICD-10-CM | POA: Diagnosis not present

## 2023-09-12 DIAGNOSIS — F0390 Unspecified dementia without behavioral disturbance: Secondary | ICD-10-CM | POA: Diagnosis not present

## 2023-09-12 LAB — CBC WITH DIFFERENTIAL/PLATELET
Abs Immature Granulocytes: 0.02 10*3/uL (ref 0.00–0.07)
Basophils Absolute: 0 10*3/uL (ref 0.0–0.1)
Basophils Relative: 1 %
Eosinophils Absolute: 0.1 10*3/uL (ref 0.0–0.5)
Eosinophils Relative: 3 %
HCT: 44.3 % (ref 39.0–52.0)
Hemoglobin: 14.6 g/dL (ref 13.0–17.0)
Immature Granulocytes: 0 %
Lymphocytes Relative: 25 %
Lymphs Abs: 1.4 10*3/uL (ref 0.7–4.0)
MCH: 31.5 pg (ref 26.0–34.0)
MCHC: 33 g/dL (ref 30.0–36.0)
MCV: 95.5 fL (ref 80.0–100.0)
Monocytes Absolute: 0.3 10*3/uL (ref 0.1–1.0)
Monocytes Relative: 6 %
Neutro Abs: 3.5 10*3/uL (ref 1.7–7.7)
Neutrophils Relative %: 65 %
Platelets: 123 10*3/uL — ABNORMAL LOW (ref 150–400)
RBC: 4.64 MIL/uL (ref 4.22–5.81)
RDW: 14.2 % (ref 11.5–15.5)
WBC: 5.4 10*3/uL (ref 4.0–10.5)
nRBC: 0 % (ref 0.0–0.2)

## 2023-09-12 LAB — COMPREHENSIVE METABOLIC PANEL WITH GFR
ALT: 15 U/L (ref 0–44)
AST: 28 U/L (ref 15–41)
Albumin: 3.6 g/dL (ref 3.5–5.0)
Alkaline Phosphatase: 59 U/L (ref 38–126)
Anion gap: 6 (ref 5–15)
BUN: 17 mg/dL (ref 8–23)
CO2: 26 mmol/L (ref 22–32)
Calcium: 9.3 mg/dL (ref 8.9–10.3)
Chloride: 107 mmol/L (ref 98–111)
Creatinine, Ser: 1.03 mg/dL (ref 0.61–1.24)
GFR, Estimated: >60 mL/min (ref >60)
Glucose, Bld: 124 mg/dL — ABNORMAL HIGH (ref 70–99)
Potassium: 4 mmol/L (ref 3.5–5.1)
Sodium: 139 mmol/L (ref 135–145)
Total Bilirubin: 0.8 mg/dL (ref 0.0–1.2)
Total Protein: 7.3 g/dL (ref 6.5–8.1)

## 2023-09-12 LAB — RESP PANEL BY RT-PCR (RSV, FLU A&B, COVID)  RVPGX2
Influenza A by PCR: NEGATIVE
Influenza B by PCR: NEGATIVE
Resp Syncytial Virus by PCR: NEGATIVE
SARS Coronavirus 2 by RT PCR: NEGATIVE

## 2023-09-12 NOTE — ED Notes (Signed)
 Patient transported to CT scan .

## 2023-09-12 NOTE — ED Provider Notes (Signed)
 [place] EMERGENCY DEPARTMENT AT [facility] Provider Note   CSN: [id number] Arrival date & time: 09/12/23  2133     History {Add pertinent medical, surgical, social history, OB history to HPI:1} Chief Complaint  Patient presents with  . Headache    Dustin Hamilton is a 83 y.o. male.  HPI     Home Medications Prior to Admission medications   Medication Sig Start Date End Date Taking? Authorizing Provider  acetaminophen (TYLENOL) 325 MG tablet Take 2 tablets (650 mg total) by mouth every 6 (six) hours as needed for mild pain (pain score 1-3) (or Fever >/= 101). 03/21/23   Elgergawy, Ardia Kraft, MD  allopurinol (ZYLOPRIM) 100 MG tablet Take 1 tablet (100 mg total) by mouth daily. 08/07/23 08/06/24  Krishnan, Gokul, MD  aspirin 81 MG chewable tablet Chew 1 tablet (81 mg total) by mouth daily. Patient not taking: Reported on 08/06/2023 03/22/23   Elgergawy, Ardia Kraft, MD  brimonidine (ALPHAGAN) 0.2 % ophthalmic solution Place 1 drop into both eyes 2 (two) times daily. Patient not taking: Reported on 08/06/2023 10/19/22   Sridharan, Sriramkumar, MD  dorzolamide (TRUSOPT) 2 % ophthalmic solution Place 1 drop into both eyes 2 (two) times daily. Patient not taking: Reported on 08/06/2023    [provider]  dutasteride (AVODART) 0.5 MG capsule Take 0.5 mg by mouth in the morning. Patient not taking: Reported on 08/06/2023    [provider]  latanoprost (XALATAN) 0.005 % ophthalmic solution Place 1 drop into both eyes at bedtime.  Patient not taking: Reported on 08/06/2023    [provider]  memantine (NAMENDA) 10 MG tablet Take 10 mg by mouth 2 (two) times daily. Patient not taking: Reported on 08/06/2023    [provider]      Allergies    Penicillins, Levetiracetam, Aricept [donepezil], Exelon [rivastigmine], and Viagra [sildenafil]    Review of Systems   Review of Systems  Physical Exam Updated Vital Signs BP (!) 162/92   Pulse 62   Temp  (!) 97.3 F (36.3 C) (Oral)   Resp 19   Ht 5\' 11"  (1.803 m)   Wt 72.1 kg   SpO2 99%   BMI 22.17 kg/m  Physical Exam  ED Results / Procedures / Treatments   Labs (all labs ordered are listed, but only abnormal results are displayed) Labs Reviewed  CBC WITH DIFFERENTIAL/PLATELET - Abnormal; Notable for the following components:      Result Value   Platelets 123 (*)    All other components within normal limits  RESP PANEL BY RT-PCR (RSV, FLU A&B, COVID)  [id number]  COMPREHENSIVE METABOLIC PANEL WITH GFR    EKG None  Radiology No results found.  Procedures Procedures  {Document cardiac monitor, telemetry assessment procedure when appropriate:1}  Medications Ordered in ED Medications - No data to display  ED Course/ Medical Decision Making/ A&P   {   Click here for ABCD2, HEART and other calculatorsREFRESH Note before signing :1}                              Medical Decision Making Amount and/or Complexity of Data Reviewed Labs: ordered.   ***  {Document critical care time when appropriate:1} {Document review of labs and clinical decision tools ie heart score, Chads2Vasc2 etc:1}  {Document your independent review of radiology images, and any outside records:1} {Document your discussion with family members, caretakers, and with consultants:1} {Document social  determinants of health affecting pt's care:1} {Document your decision making why or why not admission, treatments were needed:1} Final Clinical Impression(s) / ED Diagnoses Final diagnoses:  None    Rx / DC Orders ED Discharge Orders     None

## 2023-09-12 NOTE — ED Triage Notes (Signed)
 Pt BIB [facility] d/t wife calling saying that pt was more altered than normal with chills and a headache.  Pt has since returned to baseline.  Pt has dementia and only a&o to self and knows his wife.  In triage pt has no complaints but told EMS headache was 6/10.

## 2023-09-13 ENCOUNTER — Emergency Department (HOSPITAL_COMMUNITY)

## 2023-09-13 LAB — URINALYSIS, ROUTINE W REFLEX MICROSCOPIC
Bacteria, UA: NONE SEEN
Bilirubin Urine: NEGATIVE
Glucose, UA: NEGATIVE mg/dL
Ketones, ur: NEGATIVE mg/dL
Nitrite: NEGATIVE
Protein, ur: 100 mg/dL — AB
Specific Gravity, Urine: 1.003 — ABNORMAL LOW (ref 1.005–1.030)
pH: 7 (ref 5.0–8.0)

## 2023-09-13 MED ORDER — FOSFOMYCIN TROMETHAMINE 3 G PO PACK
3.0000 g | PACK | Freq: Once | ORAL | Status: AC
  Administered 2023-09-13: 3 g via ORAL
  Filled 2023-09-13: qty 3

## 2023-09-13 NOTE — ED Notes (Signed)
 Patient used the urinal , in and out cath discontinued.

## 2023-09-13 NOTE — Discharge Instructions (Signed)
 Dustin Hamilton is workup in the emergency department today was reassuring.  Please follow-up in the outpatient setting with his primary care doctor return to the ER with any severe symptoms.

## 2023-09-29 ENCOUNTER — Observation Stay (HOSPITAL_COMMUNITY)
Admission: EM | Admit: 2023-09-29 | Discharge: 2023-10-01 | Disposition: A | Discharge status: Home / Self Care | Attending: Internal Medicine | Admitting: Internal Medicine

## 2023-09-29 DIAGNOSIS — M109 Gout, unspecified: Secondary | ICD-10-CM | POA: Diagnosis not present

## 2023-09-29 DIAGNOSIS — Z950 Presence of cardiac pacemaker: Secondary | ICD-10-CM | POA: Insufficient documentation

## 2023-09-29 DIAGNOSIS — R0689 Other abnormalities of breathing: Secondary | ICD-10-CM | POA: Diagnosis not present

## 2023-09-29 DIAGNOSIS — I471 Supraventricular tachycardia, unspecified: Secondary | ICD-10-CM | POA: Diagnosis present

## 2023-09-29 DIAGNOSIS — R231 Pallor: Secondary | ICD-10-CM | POA: Diagnosis not present

## 2023-09-29 DIAGNOSIS — R0902 Hypoxemia: Secondary | ICD-10-CM | POA: Diagnosis not present

## 2023-09-29 DIAGNOSIS — Z79899 Other long term (current) drug therapy: Secondary | ICD-10-CM | POA: Diagnosis not present

## 2023-09-29 DIAGNOSIS — Z7982 Long term (current) use of aspirin: Secondary | ICD-10-CM | POA: Diagnosis not present

## 2023-09-29 DIAGNOSIS — R7989 Other specified abnormal findings of blood chemistry: Secondary | ICD-10-CM

## 2023-09-29 DIAGNOSIS — R778 Other specified abnormalities of plasma proteins: Secondary | ICD-10-CM | POA: Diagnosis not present

## 2023-09-29 DIAGNOSIS — F0390 Unspecified dementia without behavioral disturbance: Secondary | ICD-10-CM | POA: Diagnosis not present

## 2023-09-29 DIAGNOSIS — N179 Acute kidney failure, unspecified: Secondary | ICD-10-CM | POA: Diagnosis present

## 2023-09-29 DIAGNOSIS — Z87891 Personal history of nicotine dependence: Secondary | ICD-10-CM | POA: Diagnosis not present

## 2023-09-29 DIAGNOSIS — M79671 Pain in right foot: Secondary | ICD-10-CM | POA: Diagnosis not present

## 2023-09-29 DIAGNOSIS — R55 Syncope and collapse: Principal | ICD-10-CM | POA: Diagnosis present

## 2023-09-29 DIAGNOSIS — Z8673 Personal history of transient ischemic attack (TIA), and cerebral infarction without residual deficits: Secondary | ICD-10-CM | POA: Diagnosis not present

## 2023-09-29 DIAGNOSIS — I10 Essential (primary) hypertension: Secondary | ICD-10-CM | POA: Diagnosis present

## 2023-09-29 DIAGNOSIS — R001 Bradycardia, unspecified: Secondary | ICD-10-CM | POA: Diagnosis not present

## 2023-09-30 ENCOUNTER — Other Ambulatory Visit: Payer: Self-pay

## 2023-09-30 ENCOUNTER — Observation Stay (HOSPITAL_COMMUNITY)

## 2023-09-30 ENCOUNTER — Encounter (HOSPITAL_COMMUNITY): Payer: Self-pay | Admitting: Emergency Medicine

## 2023-09-30 ENCOUNTER — Emergency Department (HOSPITAL_COMMUNITY)

## 2023-09-30 DIAGNOSIS — I10 Essential (primary) hypertension: Secondary | ICD-10-CM

## 2023-09-30 DIAGNOSIS — R55 Syncope and collapse: Secondary | ICD-10-CM | POA: Diagnosis not present

## 2023-09-30 DIAGNOSIS — M79671 Pain in right foot: Secondary | ICD-10-CM | POA: Diagnosis not present

## 2023-09-30 DIAGNOSIS — N179 Acute kidney failure, unspecified: Secondary | ICD-10-CM | POA: Insufficient documentation

## 2023-09-30 DIAGNOSIS — M19071 Primary osteoarthritis, right ankle and foot: Secondary | ICD-10-CM | POA: Diagnosis not present

## 2023-09-30 DIAGNOSIS — M85871 Other specified disorders of bone density and structure, right ankle and foot: Secondary | ICD-10-CM | POA: Diagnosis not present

## 2023-09-30 DIAGNOSIS — Z950 Presence of cardiac pacemaker: Secondary | ICD-10-CM

## 2023-09-30 DIAGNOSIS — M7989 Other specified soft tissue disorders: Secondary | ICD-10-CM | POA: Diagnosis not present

## 2023-09-30 DIAGNOSIS — M10071 Idiopathic gout, right ankle and foot: Secondary | ICD-10-CM

## 2023-09-30 HISTORY — DX: Acute kidney failure, unspecified: N17.9

## 2023-09-30 LAB — BASIC METABOLIC PANEL WITH GFR
Anion gap: 8 (ref 5–15)
Anion gap: 7 (ref 5–15)
BUN: 16 mg/dL (ref 8–23)
BUN: 15 mg/dL (ref 8–23)
CO2: 27 mmol/L (ref 22–32)
CO2: 27 mmol/L (ref 22–32)
Calcium: 9.6 mg/dL (ref 8.9–10.3)
Calcium: 9.6 mg/dL (ref 8.9–10.3)
Chloride: 106 mmol/L (ref 98–111)
Chloride: 107 mmol/L (ref 98–111)
Creatinine, Ser: 1.32 mg/dL — ABNORMAL HIGH (ref 0.61–1.24)
Creatinine, Ser: 0.99 mg/dL (ref 0.61–1.24)
GFR, Estimated: 54 mL/min — ABNORMAL LOW (ref >60)
GFR, Estimated: >60 mL/min (ref >60)
Glucose, Bld: 136 mg/dL — ABNORMAL HIGH (ref 70–99)
Glucose, Bld: 109 mg/dL — ABNORMAL HIGH (ref 70–99)
Potassium: 3.8 mmol/L (ref 3.5–5.1)
Potassium: 4.3 mmol/L (ref 3.5–5.1)
Sodium: 141 mmol/L (ref 135–145)
Sodium: 141 mmol/L (ref 135–145)

## 2023-09-30 LAB — CBC WITH DIFFERENTIAL/PLATELET
Abs Immature Granulocytes: 0.02 10*3/uL (ref 0.00–0.07)
Basophils Absolute: 0 10*3/uL (ref 0.0–0.1)
Basophils Relative: 1 %
Eosinophils Absolute: 0.1 10*3/uL (ref 0.0–0.5)
Eosinophils Relative: 1 %
HCT: 49.3 % (ref 39.0–52.0)
Hemoglobin: 16.6 g/dL (ref 13.0–17.0)
Immature Granulocytes: 0 %
Lymphocytes Relative: 16 %
Lymphs Abs: 1.1 10*3/uL (ref 0.7–4.0)
MCH: 31.6 pg (ref 26.0–34.0)
MCHC: 33.7 g/dL (ref 30.0–36.0)
MCV: 93.7 fL (ref 80.0–100.0)
Monocytes Absolute: 0.3 10*3/uL (ref 0.1–1.0)
Monocytes Relative: 5 %
Neutro Abs: 5.2 10*3/uL (ref 1.7–7.7)
Neutrophils Relative %: 77 %
Platelets: 126 10*3/uL — ABNORMAL LOW (ref 150–400)
RBC: 5.26 MIL/uL (ref 4.22–5.81)
RDW: 14.4 % (ref 11.5–15.5)
WBC: 6.7 10*3/uL (ref 4.0–10.5)
nRBC: 0 % (ref 0.0–0.2)

## 2023-09-30 LAB — TROPONIN I (HIGH SENSITIVITY)
Troponin I (High Sensitivity): 18 ng/L — ABNORMAL HIGH (ref <18)
Troponin I (High Sensitivity): 18 ng/L — ABNORMAL HIGH (ref <18)

## 2023-09-30 LAB — CBC
HCT: 48.1 % (ref 39.0–52.0)
Hemoglobin: 15.8 g/dL (ref 13.0–17.0)
MCH: 31.5 pg (ref 26.0–34.0)
MCHC: 32.8 g/dL (ref 30.0–36.0)
MCV: 95.8 fL (ref 80.0–100.0)
Platelets: 109 10*3/uL — ABNORMAL LOW (ref 150–400)
RBC: 5.02 MIL/uL (ref 4.22–5.81)
RDW: 14.4 % (ref 11.5–15.5)
WBC: 5.7 10*3/uL (ref 4.0–10.5)
nRBC: 0 % (ref 0.0–0.2)

## 2023-09-30 LAB — HEPATIC FUNCTION PANEL
ALT: 19 U/L (ref 0–44)
AST: 30 U/L (ref 15–41)
Albumin: 3.9 g/dL (ref 3.5–5.0)
Alkaline Phosphatase: 63 U/L (ref 38–126)
Bilirubin, Direct: 0.3 mg/dL — ABNORMAL HIGH (ref 0.0–0.2)
Indirect Bilirubin: 0.6 mg/dL (ref 0.3–0.9)
Total Bilirubin: 0.9 mg/dL (ref 0.0–1.2)
Total Protein: 7.7 g/dL (ref 6.5–8.1)

## 2023-09-30 LAB — TSH: TSH: 1.267 u[IU]/mL (ref 0.350–4.500)

## 2023-09-30 LAB — GLUCOSE, CAPILLARY
Glucose-Capillary: 146 mg/dL — ABNORMAL HIGH (ref 70–99)
Glucose-Capillary: 209 mg/dL — ABNORMAL HIGH (ref 70–99)

## 2023-09-30 LAB — PROTIME-INR
INR: 1.1 (ref 0.8–1.2)
Prothrombin Time: 14.4 s (ref 11.4–15.2)

## 2023-09-30 LAB — MAGNESIUM
Magnesium: 1.9 mg/dL (ref 1.7–2.4)
Magnesium: 1.9 mg/dL (ref 1.7–2.4)

## 2023-09-30 LAB — URIC ACID: Uric Acid, Serum: 5.9 mg/dL (ref 3.7–8.6)

## 2023-09-30 LAB — C-REACTIVE PROTEIN: CRP: <0.5 mg/dL (ref <1.0)

## 2023-09-30 LAB — SEDIMENTATION RATE: Sed Rate: 9 mm/h (ref 0–16)

## 2023-09-30 MED ORDER — HYDRALAZINE HCL 20 MG/ML IJ SOLN
5.0000 mg | INTRAMUSCULAR | Status: DC | PRN
  Administered 2023-10-01: 5 mg via INTRAVENOUS
  Filled 2023-09-30: qty 1

## 2023-09-30 MED ORDER — BRIMONIDINE TARTRATE 0.2 % OP SOLN
1.0000 [drp] | Freq: Two times a day (BID) | OPHTHALMIC | Status: DC
  Administered 2023-10-01: 1 [drp] via OPHTHALMIC
  Filled 2023-09-30: qty 5

## 2023-09-30 MED ORDER — LATANOPROST 0.005 % OP SOLN
1.0000 [drp] | Freq: Every day | OPHTHALMIC | Status: DC
Start: 2023-09-30 — End: 2023-10-01
  Filled 2023-09-30: qty 2.5

## 2023-09-30 MED ORDER — COLCHICINE 0.6 MG PO TABS
0.6000 mg | ORAL_TABLET | Freq: Every day | ORAL | Status: DC
  Administered 2023-10-01: 0.6 mg via ORAL
  Filled 2023-09-30: qty 1

## 2023-09-30 MED ORDER — ACETAMINOPHEN 325 MG PO TABS: 650.0000 mg | ORAL_TABLET | Freq: Four times a day (QID) | ORAL | Status: DC | PRN

## 2023-09-30 MED ORDER — LACTATED RINGERS IV SOLN: INTRAVENOUS | Status: AC

## 2023-09-30 MED ORDER — DUTASTERIDE 0.5 MG PO CAPS
0.5000 mg | ORAL_CAPSULE | Freq: Every morning | ORAL | Status: DC
  Administered 2023-10-01: 0.5 mg via ORAL
  Filled 2023-09-30 (×2): qty 1

## 2023-09-30 MED ORDER — TRAMADOL HCL 50 MG PO TABS
50.0000 mg | ORAL_TABLET | Freq: Three times a day (TID) | ORAL | Status: DC | PRN
  Administered 2023-09-30: 50 mg via ORAL
  Filled 2023-09-30: qty 1

## 2023-09-30 MED ORDER — COLCHICINE 0.6 MG PO TABS
1.2000 mg | ORAL_TABLET | Freq: Once | ORAL | Status: AC
  Administered 2023-09-30: 1.2 mg via ORAL
  Filled 2023-09-30: qty 2

## 2023-09-30 MED ORDER — ASPIRIN 81 MG PO CHEW
81.0000 mg | CHEWABLE_TABLET | Freq: Every day | ORAL | Status: DC
  Administered 2023-09-30 – 2023-10-01 (×2): 81 mg via ORAL
  Filled 2023-09-30 (×2): qty 1

## 2023-09-30 MED ORDER — ALLOPURINOL 100 MG PO TABS
100.0000 mg | ORAL_TABLET | Freq: Every day | ORAL | Status: DC
  Administered 2023-09-30 – 2023-10-01 (×2): 100 mg via ORAL
  Filled 2023-09-30 (×2): qty 1

## 2023-09-30 MED ORDER — MEMANTINE HCL 10 MG PO TABS
10.0000 mg | ORAL_TABLET | Freq: Two times a day (BID) | ORAL | Status: DC
Start: 2023-09-30 — End: 2023-10-01
  Administered 2023-09-30 – 2023-10-01 (×3): 10 mg via ORAL
  Filled 2023-09-30 (×3): qty 1

## 2023-09-30 MED ORDER — ACETAMINOPHEN 650 MG RE SUPP: 650.0000 mg | Freq: Four times a day (QID) | RECTAL | Status: DC | PRN

## 2023-09-30 MED ORDER — ENOXAPARIN SODIUM 40 MG/0.4ML IJ SOSY
40.0000 mg | PREFILLED_SYRINGE | INTRAMUSCULAR | Status: DC
  Administered 2023-09-30 – 2023-10-01 (×2): 40 mg via SUBCUTANEOUS
  Filled 2023-09-30 (×2): qty 0.4

## 2023-09-30 MED ORDER — PREDNISONE 20 MG PO TABS
60.0000 mg | ORAL_TABLET | Freq: Once | ORAL | Status: AC
  Administered 2023-09-30: 60 mg via ORAL
  Filled 2023-09-30: qty 3

## 2023-09-30 MED ORDER — DORZOLAMIDE HCL 2 % OP SOLN
1.0000 [drp] | Freq: Two times a day (BID) | OPHTHALMIC | Status: DC
  Administered 2023-10-01: 1 [drp] via OPHTHALMIC
  Filled 2023-09-30: qty 10

## 2023-09-30 MED ORDER — PREDNISONE 20 MG PO TABS
40.0000 mg | ORAL_TABLET | Freq: Every day | ORAL | Status: DC
  Administered 2023-10-01: 40 mg via ORAL
  Filled 2023-09-30: qty 2

## 2023-09-30 NOTE — ED Notes (Signed)
 RN was able to interrogate pacemaker. Per company, results to be faxed over.

## 2023-09-30 NOTE — ED Provider Notes (Addendum)
 [facility]ENCY DEPT [facility] Emergency Department Provider Note MRN:  [id number]  Arrival date & time: 09/30/23     Chief Complaint   Loss of Consciousness   History of Present Illness   Dustin Hamilton is a 83 y.o. year-old male with a history of pacemaker, dementia, stroke presenting to the ED with chief complaint of loss of consciousness.  Report of syncopal episode at home.  Wife found him slumped over on his walker, unresponsive in the bathroom.  EMS was called, found him to be bradycardic in the 20s.  Became more alert during transport.  Seems to be at his baseline currently, denies chest pain or shortness of breath before or after the event, does not really remember what happened.  Review of Systems  A thorough review of systems was obtained and all systems are negative except as noted in the HPI and PMH.   Patient's Health History    Past Medical History:  Diagnosis Date   BPH (benign prostatic hyperplasia)    Cerebrovascular disease    Clostridium difficile diarrhea    Congenital anomaly of diaphragm    Elevated PSA    Glaucoma, both eyes    Hemorrhoid    Hepatitis B surface antigen positive    02-20-2011   History of adenomatous polyp of colon    2007, 2009 and 2013  tubular adenoma's   History of alcohol  abuse    quit 1963   History of cerebral parenchymal hemorrhage    01/ 2006  left occiptial lobe related to hypertensive crisis   History of CVA (cerebrovascular accident)    09-12-2012  left hippocampus/ amygdala junction and per MRI old white matter infarcts--  per pt residual short- term memory issues   History of fatty infiltration of liver hx visit's at 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   HTN (hypertension)    Renal cyst, left    Stroke (HCC)    hx of 3 strokes in past     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    Urethral lesion    urethral mass    Past Surgical History:  Procedure Laterality Date   CARDIOVASCULAR STRESS TEST  05/05/2007   normal nuclear study w/ no ischemia/  normal LV fucntion and wall motion , ef60%   COLONOSCOPY  last one 04-06-2012   CYSTO/  LEFT RETROGRADE PYELOGRAM/ CYTOLOGY WASHINGS/  URETEROSCOPY  03/05/2000   INGUINAL HERNIA REPAIR Bilateral 1965 and 1980's   IR 3D INDEPENDENT WKST  07/31/2022   IR ANGIOGRAM PELVIS SELECTIVE OR SUPRASELECTIVE  07/31/2022   IR ANGIOGRAM SELECTIVE EACH ADDITIONAL VESSEL  07/31/2022   IR ANGIOGRAM SELECTIVE EACH ADDITIONAL VESSEL  07/31/2022   IR ANGIOGRAM SELECTIVE EACH ADDITIONAL VESSEL  07/31/2022   IR ANGIOGRAM SELECTIVE EACH ADDITIONAL VESSEL  07/31/2022   IR EMBO TUMOR ORGAN ISCHEMIA INFARCT INC GUIDE ROADMAPPING  07/31/2022   IR RADIOLOGIST EVAL & MGMT  06/26/2022   IR RADIOLOGIST EVAL & MGMT  08/08/2022   IR RADIOLOGIST EVAL & MGMT  08/21/2022   IR RADIOLOGIST EVAL & MGMT  03/25/2023   IR US  GUIDE VASC ACCESS RIGHT  07/31/2022   LAPAROSCOPIC INGUINAL HERNIA WITH UMBILICAL HERNIA Right 06/24/2007   LIVER BIOPSY  1980's and 2004   PACEMAKER  IMPLANT N/A 05/28/2020   Procedure: PACEMAKER IMPLANT;  Surgeon: Tammie Fall, MD;  Location[facility] INVASIVE CV LAB;  Service: Cardiovascular;  Laterality: N/A;   SVT ABLATION N/A 11/15/2018   Procedure: SVT ABLATION;  Surgeon: Tammie Fall, MD;  Location: [facility] INVASIVE CV LAB;  Service: Cardiovascular;  Laterality: N/A;   TRANSTHORACIC ECHOCARDIOGRAM  09/13/2012   moderate LVH,  ef 60-65%/     TRANSURETHRAL RESECTION OF BLADDER TUMOR N/A 08/11/2016   Procedure: TRANSURETHRAL RESECTION OF BLADDER TUMOR (TURBT);  Surgeon: Marco Severs, MD;  Location: [facility];  Service: Urology;  Laterality: N/A;    Family History  Problem Relation Age of Onset   Rheum arthritis Mother    Diabetes Mother    Stroke  Mother    Heart attack Mother    Kidney failure Mother    Heart attack Father    Heart disease Maternal Grandmother    Rheum arthritis Maternal Grandmother    Diabetes Maternal Grandmother    Stroke Maternal Grandmother    Colon cancer Neg Hx     Social History   Socioeconomic History   Marital status: Married    Spouse name: Wallene Gum   Number of children: 1   Years of education: College   Highest education level: Not on file  Occupational History   Occupation: Environmental manager  Tobacco Use   Smoking status: Former    Current packs/day: 0.00    Average packs/day: 1 pack/day for 5.0 years (5.0 ttl pk-yrs)    Types: Cigarettes    Start date: 11/30/1976    Quit date: 11/30/1981    Years since quitting: 41.8   Smokeless tobacco: Never  Vaping Use   Vaping status: Never Used  Substance and Sexual Activity   Alcohol  use: No    Alcohol /week: 0.0 standard drinks of alcohol     Comment: hx abuse -- quit:  1963   Drug use: No    Comment: 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   Caffeine Use:  Tea, lots   Social Drivers of Health   Financial Resource Strain: Not on file  Food Insecurity: No Food Insecurity (08/06/2023)   Hunger Vital Sign    Worried About Running Out of Food in the Last Year: Never true    Ran Out of Food in the Last Year: Never true  Transportation Needs: No Transportation Needs (08/06/2023)   PRAPARE - Administrator, Civil Service (Medical): No    Lack of Transportation (Non-Medical): No  Physical Activity: Not on file  Stress: Not on file  Social Connections: Socially Integrated (08/06/2023)   Social Connection and Isolation Panel [NHANES]    Frequency of Communication with Friends and Family: More than three times a week    Frequency of Social Gatherings with Friends and Family: Twice a week    Attends Religious  Services: More than 4 times per year    Active Member of [employer] or Organizations: Yes    Attends Engineer, structural: More than 4 times per year    Marital Status: Married  Catering manager Violence: Not At Risk (08/06/2023)   Humiliation, Afraid, Rape, and Kick questionnaire    Fear of Current or Ex-Partner: No    Emotionally Abused: No    Physically Abused: No  Sexually Abused: No     Physical Exam   Vitals:   09/30/23 0100 09/30/23 0230  BP: 112/73 123/79  Pulse: 63 60  Resp: 17 17  Temp:    SpO2: 98% 100%    CONSTITUTIONAL: Well-appearing, NAD NEURO/PSYCH: Awake and alert, oriented to name place and time, moves all extremities equally EYES:  eyes equal and reactive ENT/NECK:  no LAD, no JVD CARDIO: Regular rate, well-perfused, normal S1 and S2 PULM:  CTAB no wheezing or rhonchi GI/GU:  non-distended, non-tender MSK/SPINE:  No gross deformities, no edema SKIN:  no rash, atraumatic   *Additional and/or pertinent findings included in MDM below  Diagnostic and Interventional Summary    EKG Interpretation Date/Time:  Wednesday September 30 2023 00:04:48 EDT Ventricular Rate:  68 PR Interval:  291 QRS Duration:  143 QT Interval:  419 QTC Calculation: 446 R Axis:   -84  Text Interpretation: Sinus rhythm Prolonged PR interval Probable left atrial enlargement Nonspecific IVCD with LAD Anterior infarct, old Confirmed by Gwenetta Lennert [phone]) on 09/30/2023 1:24:38 AM       Labs Reviewed  CBC - Abnormal; Notable for the following components:      Result Value   Platelets 109 (*)    All other components within normal limits  BASIC METABOLIC PANEL WITH GFR - Abnormal; Notable for the following components:   Glucose, Bld 136 (*)    Creatinine, Ser 1.32 (*)    GFR, Estimated 54 (*)    All other components within normal limits  TROPONIN I (HIGH SENSITIVITY) - Abnormal; Notable for the following components:   Troponin I (High Sensitivity) 18 (*)    All other  components within normal limits  PROTIME-INR  MAGNESIUM   TROPONIN I (HIGH SENSITIVITY)    DG Chest Port 1 View  Final Result      Medications - No data to display   Procedures  /  Critical Care .Critical Care  Performed by: Edson Graces, MD Authorized by: Edson Graces, MD   Critical care provider statement:    Critical care time (minutes):  30   Critical care was necessary to treat or prevent imminent or life-threatening deterioration of the following conditions: syncope and collapse and elevated troponin.   Critical care was time spent personally by me on the following activities:  Development of treatment plan with patient or surrogate, discussions with consultants, evaluation of patient's response to treatment, examination of patient, ordering and review of laboratory studies, ordering and review of radiographic studies, ordering and performing treatments and interventions, pulse oximetry, re-evaluation of patient's condition and review of old charts   ED Course and Medical Decision Making  Initial Impression and Ddx Syncopal or unresponsive event with bradycardia, concern for bradycardia arrhythmia, pacemaker malfunction, seems to have had an abrupt return to baseline.  Will have patient on cardiac monitoring, interrogate pacemaker, check screening labs electrolytes.  Past medical/surgical history that increases complexity of ED encounter: Pacemaker, dementia  Interpretation of Diagnostics I personally reviewed the EKG and my interpretation is as follows: Sinus rhythm, conduction delay  No significant blood count or electrolyte disturbance.  Troponin minimally elevated  Patient Reassessment and Ultimate Disposition/Management     Case discussed with Dr. Donna Fus cardiology, plan is for hospitalist admission for telemetry.  Cardiology to follow-up on pacemaker interrogation report.  Patient management required discussion with the following services or consulting groups:   Hospitalist Service and Cardiology  Complexity of Problems Addressed Acute illness or injury that poses threat of  life of bodily function  Additional Data Reviewed and Analyzed Further history obtained from: EMS on arrival  Additional Factors Impacting ED Encounter Risk Consideration of hospitalization  Merrick Abe. Harless Lien, MD [facility] Emergency Medicine [facility] [email or web address]  Final Clinical Impressions(s) / ED Diagnoses     ICD-10-CM   1. Syncope and collapse  R55     2. Elevated troponin  R79.89     3. Pacemaker  Z95.0       ED Discharge Orders     None        Discharge Instructions Discussed with and Provided to Patient:   Discharge Instructions   None      Edson Graces, MD 09/30/23 0102    Edson Graces, MD 09/30/23 [phone]

## 2023-09-30 NOTE — ED Notes (Signed)
 Pt resting, reports feels fine. Denies needs. Call bell within reach.

## 2023-09-30 NOTE — Plan of Care (Signed)
  Problem: Clinical Measurements: Goal: Diagnostic test results will improve Outcome: Progressing Goal: Respiratory complications will improve Outcome: Progressing Goal: Cardiovascular complication will be avoided Outcome: Progressing   Problem: Activity: Goal: Risk for activity intolerance will decrease Outcome: Progressing   Problem: Nutrition: Goal: Adequate nutrition will be maintained Outcome: Progressing   Problem: Elimination: Goal: Will not experience complications related to bowel motility Outcome: Progressing

## 2023-09-30 NOTE — H&P (Signed)
 History and Physical    Dustin Hamilton ZOX:[id number] DOB: 05/11/1941 DOA: 09/29/2023  Patient coming from: Home.  Chief Complaint: Loss of consciousness.  HPI: Dustin Hamilton is a 83 y.o. male with history of complete heart block status post pacemaker placement, vascular dementia, history of CVA, hypertension, gout was brought to the ER after patient had a syncopal episode.  Patient had a similar episode in March of this year which was attributed to orthostatic hypotension.  Patient's wife states that patient was watching television when he suddenly got excited and stood up when he lost consciousness.  He may have lost consciousness for 2 to 3 minutes.  Did not have any seizure-like activity.  Did not have any complaints of chest pain palpitation shortness of breath prior to the episode.  Patient recently had acute exacerbation of his gout on his right foot for which patient was taking double the dose of his allopurinol .  As per the wife it has improved.  ED Course: In the ER patient appears nonfocal.  EKG shows normal sinus rhythm.  The pacemaker was interrogated by cardiologist.  Creatinine 1.3 increased from normal in April 2025.  Troponins were flat at 18.  Patient admitted for further observation for syncope.  Review of Systems: As per HPI, rest all negative.   Past Medical History:  Diagnosis Date   BPH (benign prostatic hyperplasia)    Cerebrovascular disease    Clostridium difficile diarrhea    Congenital anomaly of diaphragm    Elevated PSA    Glaucoma, both eyes    Hemorrhoid    Hepatitis B surface antigen positive    02-20-2011   History of adenomatous polyp of colon    2007, 2009 and 2013  tubular adenoma's   History of alcohol  abuse    quit 1963   History of cerebral parenchymal hemorrhage    01/ 2006  left occiptial lobe related to hypertensive crisis   History of CVA (cerebrovascular accident)    09-12-2012  left hippocampus/ amygdala junction and per MRI  old white matter infarcts--  per pt residual short- term memory issues   History of fatty infiltration of liver hx visit's at 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    Urethral lesion    urethral mass    Past Surgical History:  Procedure Laterality Date   CARDIOVASCULAR STRESS TEST  05/05/2007   normal nuclear study w/ no ischemia/  normal LV fucntion and wall motion , ef60%   COLONOSCOPY  last one 04-06-2012   CYSTO/  LEFT RETROGRADE PYELOGRAM/ CYTOLOGY WASHINGS/  URETEROSCOPY  03/05/2000   INGUINAL HERNIA REPAIR Bilateral 1965 and 1980's   IR 3D INDEPENDENT WKST  07/31/2022   IR ANGIOGRAM PELVIS SELECTIVE OR SUPRASELECTIVE  07/31/2022   IR ANGIOGRAM SELECTIVE EACH ADDITIONAL VESSEL  07/31/2022   IR ANGIOGRAM SELECTIVE EACH ADDITIONAL VESSEL  07/31/2022   IR ANGIOGRAM SELECTIVE EACH ADDITIONAL VESSEL  07/31/2022   IR ANGIOGRAM SELECTIVE EACH ADDITIONAL VESSEL  07/31/2022   IR EMBO TUMOR ORGAN ISCHEMIA INFARCT INC GUIDE ROADMAPPING  07/31/2022  IR RADIOLOGIST EVAL & MGMT  06/26/2022   IR RADIOLOGIST EVAL & MGMT  08/08/2022   IR RADIOLOGIST EVAL & MGMT  08/21/2022   IR RADIOLOGIST EVAL & MGMT  03/25/2023   IR US  GUIDE VASC ACCESS RIGHT  07/31/2022   LAPAROSCOPIC INGUINAL HERNIA WITH UMBILICAL HERNIA Right 06/24/2007   LIVER BIOPSY  1980's and 2004   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   Procedure: PACEMAKER IMPLANT;  Surgeon: Tammie Fall, MD;  Location: [facility];  Service: Cardiovascular;  Laterality: N/A;   SVT ABLATION N/A 11/15/2018   Procedure: SVT ABLATION;  Surgeon: Tammie Fall, MD;  Location: [facility];  Service: Cardiovascular;  Laterality:  N/A;   TRANSTHORACIC ECHOCARDIOGRAM  09/13/2012   moderate LVH,  ef 60-65%/     TRANSURETHRAL RESECTION OF BLADDER TUMOR N/A 08/11/2016   Procedure: TRANSURETHRAL RESECTION OF BLADDER TUMOR (TURBT);  Surgeon: Marco Severs, MD;  Location: [facility];  Service: Urology;  Laterality: N/A;     reports that he quit smoking about 41 years ago. His smoking use included cigarettes. He started smoking about 46 years ago. He has a 5 pack-year smoking history. He has never used smokeless tobacco. He reports that he does not drink alcohol  and does not use drugs.  Allergies  Allergen Reactions   Penicillins Hives   Levetiracetam  Other (See Comments)    Altered mental status   Aricept  [Donepezil ] Other (See Comments)    Dizziness    Exelon [Rivastigmine] Other (See Comments)    Malaise    Viagra [Sildenafil] Palpitations and Other (See Comments)    Dizziness, also     Family History  Problem Relation Age of Onset   Rheum arthritis Mother    Diabetes Mother    Stroke Mother    Heart attack Mother    Kidney failure Mother    Heart attack Father    Heart disease Maternal Grandmother    Rheum arthritis Maternal Grandmother    Diabetes Maternal Grandmother    Stroke Maternal Grandmother    Colon cancer Neg Hx     Prior to Admission medications   Medication Sig Start Date End Date Taking? Authorizing Provider  acetaminophen  (TYLENOL ) 325 MG tablet Take 2 tablets (650 mg total) by mouth every 6 (six) hours as needed for mild pain (pain score 1-3) (or Fever >/= 101). 03/21/23   Elgergawy, Ardia Kraft, MD  allopurinol  (ZYLOPRIM ) 100 MG tablet Take 1 tablet (100 mg total) by mouth daily. 08/07/23 08/06/24  Krishnan, Gokul, MD  aspirin  81 MG chewable tablet Chew 1 tablet (81 mg total) by mouth daily. Patient not taking: Reported on 08/06/2023 03/22/23   Elgergawy, Ardia Kraft, MD  brimonidine  (ALPHAGAN ) 0.2 % ophthalmic solution Place 1 drop into both eyes 2 (two) times daily. Patient  not taking: Reported on 08/06/2023 10/19/22   Sridharan, Sriramkumar, MD  dorzolamide  (TRUSOPT ) 2 % ophthalmic solution Place 1 drop into both eyes 2 (two) times daily. Patient not taking: Reported on 08/06/2023    [provider]  dutasteride  (AVODART ) 0.5 MG capsule Take 0.5 mg by mouth in the morning. Patient not taking: Reported on 08/06/2023    [provider]  latanoprost  (XALATAN ) 0.005 % ophthalmic solution Place 1 drop into both eyes at bedtime.  Patient not taking: Reported on 08/06/2023    [provider]  memantine  (NAMENDA ) 10 MG tablet Take 10 mg by mouth 2 (two) times daily. Patient not taking: Reported on 08/06/2023  [provider]    Physical Exam: Constitutional: Moderately built and nourished. Vitals:   09/30/23 0007 09/30/23 0008 09/30/23 0100 09/30/23 0230  BP: 121/75  112/73 123/79  Pulse: 73  63 60  Resp: 20  17 17   Temp: 97.8 F (36.6 C)     TempSrc: Oral     SpO2: 100%  98% 100%  Weight:  71.7 kg    Height:  5\' 11"  (1.803 m)     Eyes: Anicteric no pallor. ENMT: No discharge from the ears/nose or mouth. Neck: No mass felt.  No neck rigidity. Respiratory: No rhonchi or crepitations. Cardiovascular: S1-S2 heard. Abdomen: Soft nontender bowel sound present. Musculoskeletal: No edema. Skin: No rash. Neurologic: Alert awake oriented to his name.  Moving all extremities. Psychiatric: Oriented to his name.   Labs on Admission: I have personally reviewed following labs and imaging studies  CBC: Recent Labs  Lab 09/30/23 0020  WBC 5.7  HGB 15.8  HCT 48.1  MCV 95.8  PLT 109*   Basic Metabolic Panel: Recent Labs  Lab 09/30/23 0020  NA 141  K 3.8  CL 106  CO2 27  GLUCOSE 136*  BUN 16  CREATININE 1.32*  CALCIUM  9.6  MG 1.9   GFR: Estimated Creatinine Clearance: 43.8 mL/min (A) (by C-G formula based on SCr of 1.32 mg/dL (H)). Liver Function Tests: No results for input(s): "AST", "ALT", "ALKPHOS", "BILITOT",  "PROT", "ALBUMIN" in the last 168 hours. No results for input(s): "LIPASE", "AMYLASE" in the last 168 hours. No results for input(s): "AMMONIA" in the last 168 hours. Coagulation Profile: Recent Labs  Lab 09/30/23 0020  INR 1.1   Cardiac Enzymes: No results for input(s): "CKTOTAL", "CKMB", "CKMBINDEX", "TROPONINI" in the last 168 hours. BNP (last 3 results) No results for input(s): "PROBNP" in the last 8760 hours. HbA1C: No results for input(s): "HGBA1C" in the last 72 hours. CBG: No results for input(s): "GLUCAP" in the last 168 hours. Lipid Profile: No results for input(s): "CHOL", "HDL", "LDLCALC", "TRIG", "CHOLHDL", "LDLDIRECT" in the last 72 hours. Thyroid  Function Tests: No results for input(s): "TSH", "T4TOTAL", "FREET4", "T3FREE", "THYROIDAB" in the last 72 hours. Anemia Panel: No results for input(s): "VITAMINB12", "FOLATE", "FERRITIN", "TIBC", "IRON", "RETICCTPCT" in the last 72 hours. Urine analysis:    Component Value Date/Time   COLORURINE STRAW (A) 09/13/2023 0118   APPEARANCEUR CLEAR 09/13/2023 0118   LABSPEC 1.003 (L) 09/13/2023 0118   PHURINE 7.0 09/13/2023 0118   GLUCOSEU NEGATIVE 09/13/2023 0118   GLUCOSEU NEGATIVE 02/17/2011 0930   HGBUR SMALL (A) 09/13/2023 0118   BILIRUBINUR NEGATIVE 09/13/2023 0118   KETONESUR NEGATIVE 09/13/2023 0118   PROTEINUR 100 (A) 09/13/2023 0118   UROBILINOGEN 1.0 09/12/2012 1802   NITRITE NEGATIVE 09/13/2023 0118   LEUKOCYTESUR LARGE (A) 09/13/2023 0118   Sepsis Labs: @LABRCNTIP (procalcitonin:4,lacticidven:4) )No results found for this or any previous visit (from the past 240 hours).   Radiological Exams on Admission: DG Chest Port 1 View Result Date: 09/30/2023 EXAM: 1 VIEW XRAY OF THE CHEST 09/30/2023 12:52:00 AM COMPARISON: 09/13/2023 CLINICAL HISTORY: Syncope ?pacer wires. Encounter for syncope and pacer wires. FINDINGS: LUNGS AND PLEURA: No consolidation. No pulmonary edema. No pleural effusion. No pneumothorax. HEART  AND MEDIASTINUM: Left subclavian dual lead pacemaker in satisfactory position. BONES AND SOFT TISSUES: No acute osseous abnormality. Mild eventration of the right hemidiaphragm. IMPRESSION: 1. Left subclavian dual lead pacemaker in satisfactory position. Electronically signed by: Zadie Herter MD 09/30/2023 01:08 AM EDT RP Workstation: [id number]    EKG:  Independently reviewed.  Normal sinus rhythm.  Assessment/Plan Principal Problem:   Syncope Active Problems:   Essential hypertension   SVT (supraventricular tachycardia) (HCC)   History of stroke   Dementia (HCC)   Gout   ARF (acute renal failure) (HCC)    Syncope cause not clear -     appreciate cardiology consult.  As per the cardiologist pacemaker interrogation did not show any abnormal rhythm.  Patient had orthostatic hypotension in March of this year when antihypertensives were discontinued.  Per patient's wife patient is continuing to take his amlodipine  half the dose and metoprolol .  Will hold them for now check orthostatics. Acute renal failure will gently hydrate.  Creatinine has increased from recent past.  Follow metabolic panel. History of stroke on aspirin . Complete heart block and history of AVNRT status post pacemaker placement. History of vascular dementia on memantine . History of gout with recent gout attack of his right foot on allopurinol . Hypertension holding amlodipine  and metoprolol  until we confirm patient is not orthostatic.  As needed IV hydralazine .  Since patient has syncope with acute renal failure will need close monitoring further workup and more than 2 midnight stay.   DVT prophylaxis: Lovenox . Code Status: Full code. Family Communication: Patient's wife. Disposition Plan: Monitored bed. Consults called: Cardiology. Admission status: Observation.

## 2023-09-30 NOTE — Evaluation (Signed)
 Physical Therapy Evaluation and Discharge Patient Details Name: Dustin Hamilton MRN: [id number] DOB: July 29, 1940 Today's Date: 09/30/2023  History of Present Illness  83 y.o. male was brought to the ER after patient had a syncopal episode after quickly coming from seated to standing.EMS reported HR in 20s in the field. Pt kept for observation of syncope. NWG:[id number] heart block status post pacemaker placement, vascular dementia, history of CVA, hypertension, gout  Clinical Impression  Pt lives with wife in single story home with ramped entrance. At baseline pt ambulates in his home with Rollator, pt has Aide 3 hours a day, 4 days a week, who assists with his ADL and iADLs. Orthostatic vitals taken at start of mobilization and pt negative for orthostatic response to positional change (see Flowsheet Vitals) Pt is currently limited in safe mobility by R foot gout pain, and generalized weakness. Pt is currently mod I for bed mobility and transfers and contact guard assist for ambulation with RW in hallway. At this time, pt is close to his baseline and PT will discharge from caseload. Referral made to Mobility Specialists to keep pt mobile and reduce loss of strength during hospitalization.      If plan is discharge home, recommend the following: A little help with walking and/or transfers;A lot of help with bathing/dressing/bathroom;Assistance with cooking/housework;Assistance with feeding;Direct supervision/assist for medications management;Direct supervision/assist for financial management;Assist for transportation;Help with stairs or ramp for entrance;Supervision due to cognitive status   Can travel by private vehicle    Yes    Equipment Recommendations None recommended by PT        Precautions / Restrictions Precautions Precautions: Fall Restrictions Weight Bearing Restrictions Per Provider Order: No      Mobility  Bed Mobility Overal bed mobility: Modified Independent              General bed mobility comments: minor use of rails to come to EoB    Transfers Overall transfer level: Modified independent Equipment used: Rolling walker (2 wheels)               General transfer comment: good power up and self steady in RW, cues for hand placement for power up    Ambulation/Gait Ambulation/Gait assistance: Contact guard assist Gait Distance (Feet): 40 Feet Assistive device: Rolling walker (2 wheels) Gait Pattern/deviations: Step-through pattern, Decreased weight shift to right, Decreased stance time - right, Shuffle, Antalgic, Trunk flexed Gait velocity: WFL for age Gait velocity interpretation: <1.31 ft/sec, indicative of household ambulator   General Gait Details: CGA for safety for ambulation with RW, pt with decreased weightshift onto R LE due to foot gout pain, vc for proximity to RW, pt uses Rollator at baseline     Balance Overall balance assessment: Mild deficits observed, not formally tested                                           Pertinent Vitals/Pain Pain Assessment Pain Assessment: Faces Faces Pain Scale: Hurts little more Pain Location: R foot gout pain with weight bearing Pain Descriptors / Indicators: Grimacing, Guarding, Moaning Pain Intervention(s): Limited activity within patient's tolerance, Monitored during session, Repositioned    Home Living Family/patient expects to be discharged to:: Private residence Living Arrangements: Spouse/significant other Available Help at Discharge: Family;Available 24 hours/day Type of Home: House Home Access: Ramped entrance       Home Layout: One level Home  Equipment: Grab bars - toilet;Grab bars - tub/shower;BSC/3in1;Hand held shower head;Cane - single point;Rollator (4 wheels) Additional Comments: Aide x3 hrs 4day/wk    Prior Function Prior Level of Function : Needs assist  Cognitive Assist : ADLs (cognitive)           Mobility Comments: uses rollator for  mobility ADLs Comments: assist with bathing tasks, wears briefs at baseline. pt reports wife will assist with dressing as needed. Aide/family assist with IADLs        Communication   Communication Communication: Impaired Factors Affecting Communication: Difficulty expressing self (looks to his wife to answer questions)    Cognition Arousal: Alert Behavior During Therapy: WFL for tasks assessed/performed   PT - Cognitive impairments: History of cognitive impairments, Orientation, Awareness, Memory, Attention, Initiation, Safety/Judgement                         Following commands: Intact       Cueing Cueing Techniques: Verbal cues, Gestural cues, Visual cues, Tactile cues     General Comments General comments (skin integrity, edema, etc.): Orthostatic Vitals taken and are in vitals flowsheet, pt with no significant drop in BP with positional change, wife in room provides home set up and PLOF, R foot swollen and red from gout flare     Frequency Min 2X/week        AM-PAC PT "6 Clicks" Mobility  Outcome Measure Help needed turning from your back to your side while in a flat bed without using bedrails?: None Help needed moving from lying on your back to sitting on the side of a flat bed without using bedrails?: None Help needed moving to and from a bed to a chair (including a wheelchair)?: None Help needed standing up from a chair using your arms (e.g., wheelchair or bedside chair)?: None Help needed to walk in hospital room?: None Help needed climbing 3-5 steps with a railing? : A Little 6 Click Score: 23    End of Session Equipment Utilized During Treatment: Gait belt Activity Tolerance: Patient tolerated treatment well Patient left: in bed;with call bell/phone within reach;with family/visitor present Nurse Communication: Mobility status PT Visit Diagnosis: Other abnormalities of gait and mobility (R26.89);Difficulty in walking, not elsewhere classified  (R26.2);Pain Pain - Right/Left: Right Pain - part of body: Ankle and joints of foot    Time: 0910-0957 PT Time Calculation (min) (ACUTE ONLY): 47 min   Charges:   PT Evaluation $PT Eval Low Complexity: 1 Low PT Treatments $Gait Training: 8-22 mins $Therapeutic Activity: 8-22 mins PT General Charges $$ ACUTE PT VISIT: 1 Visit         Jalayne Ganesh B. Jewel Mortimer PT, DPT Acute Rehabilitation Services Please use secure chat or  Call Office [phone]   Verlie Glisson [facility] 09/30/2023, 10:23 AM

## 2023-09-30 NOTE — ED Notes (Signed)
 Interrogation report given to provider.

## 2023-09-30 NOTE — ED Triage Notes (Signed)
  Patient BIB EMS for syncopal episode at home.  Wife states patient was slumped over in walker and unresponsive, called EMS and HR was in 20's when they arrived.  Patient became more alert during transport.  Patient A&O at this time and has no complaints of pain.  Has pacemaker that was recently checked.

## 2023-09-30 NOTE — ED Notes (Signed)
Pt resting, no distress noted.

## 2023-09-30 NOTE — ED Notes (Signed)
 RN spoke with [employer]. Unable to interrogate pacemaker from a machine here. They report they'll try and do it remotely and if unable, will come in.

## 2023-09-30 NOTE — Progress Notes (Signed)
 [facility]                                                                              Dustin Hamilton, is a 83 y.o. male, DOB - Apr 17, 1941, ZOX:[id number] Admit date - 09/29/2023    Outpatient Primary MD for the patient is [facility], [facility]  LOS - 0  days  Chief Complaint  Patient presents with   Loss of Consciousness       Brief summary   Patient is an 83 year old male with complete heart block status post pacemaker placement, vascular mention, history of CVA, HTN, gout presented with a syncopal episode.  Similar episode in March of this year which was attributed to orthostatic hypotension.  Patient's wife reported that he was watching television when he suddenly got excited and stood up and then lost his consciousness for 2 to 3 minutes.  No seizure-like activity, no prodromal symptoms, chest pain palpitation shortness of breath.  Patient recently had acute exacerbation of gout of the right foot and was taking double the dose of allopurinol . Pacemaker was interrogated by cardiology in ED and was found to have no arrhythmias.  Creatinine 1.3.  Admitted for syncope.   Assessment & Plan    Principal Problem:   Syncope -Still need orthostatic vitals however patient has now received IV fluids - EKG showed normal sinus rhythm, no ST-T wave changes suggestive of ischemia - Troponins flat 18-18.   - 2D echo had shown normal EF in 08/2023, no significant valvular abnormalities - Per wife has not been eating or drinking well in the last few days - Continue gentle hydration.  Active Problems:  Acute gout flare - Still has significant pain in his right foot, swollen - Obtain ESR, CRP, uric acid, right foot x-ray -Colchicine  1.2 mg x 1, then 0.6 mg daily -Prednisone  60 mg x 1, then continue 40 mg p.o. daily for 5 days - Continue allopurinol  - Placed on tramadol  as needed for pain, PT evaluation     Essential hypertension -Check orthostatic vital signs - Not  supposed to be on any antihypertensives.  Report the previous discharge summary in 08/2023, amlodipine  and metoprolol  were discontinued.  Per wife, patient is continuing to take half dose of amlodipine  and metoprolol . - Continue to hold amlodipine , metoprolol , awaiting orthostatic vital signs  CHB, history of AVNRT with pacemaker - Pacemaker interrogated, no arrhythmias     History of stroke -Continue aspirin     AKI (acute kidney injury) (HCC) -Likely due to hypotension and poor p.o. intake -Creatinine 1.32 on admission, received IV fluid hydration, creatinine improving.    Dementia (HCC) -Continue memantine   Estimated body mass index is 22.04 kg/m as calculated from the following:   Height as of this encounter: 5\' 11"  (1.803 m).   Weight as of this encounter: 71.7 kg.  Code Status: Full code DVT Prophylaxis:  enoxaparin  (LOVENOX ) injection 40 mg Start: 09/30/23 1000   Level of Care: Level of care: Telemetry Medical Family Communication: Updated patient's wife at the bedside Disposition Plan:      Remains inpatient appropriate: Hopefully tomorrow   Procedures:  None  Consultants:   None  Antimicrobials:   Anti-infectives (From admission, onward)    None          Medications  allopurinol   100 mg Oral Daily   aspirin   81 mg Oral Daily   brimonidine   1 drop Both Eyes BID   [START ON 10/01/2023] colchicine   0.6 mg Oral Daily   colchicine   1.2 mg Oral Once   dorzolamide   1 drop Both Eyes BID   dutasteride   0.5 mg Oral q AM   enoxaparin  (LOVENOX ) injection  40 mg Subcutaneous Q24H   latanoprost   1 drop Both Eyes QHS   memantine   10 mg Oral BID   [START ON 10/01/2023] predniSONE   40 mg Oral QAC breakfast   predniSONE   60 mg Oral Once      Subjective:   [name] was seen and examined today.  Overall feeling better, no dizziness, chest pain, shortness of breath, fevers.  On examining feet, patient started screaming with pain in his right foot.  Foot is  swollen.  States pain is typical of his gout attack.  Wife at bedside.  Objective:   Vitals:   09/30/23 0600 09/30/23 0615 09/30/23 0645 09/30/23 0915  BP: (!) 145/93   (!) 145/88  Pulse:  71  61  Resp:  13  16  Temp:   98.2 F (36.8 C)   TempSrc:   Oral   SpO2:  98%  100%  Weight:      Height:       No intake or output data in the 24 hours ending 09/30/23 0928   Wt Readings from Last 3 Encounters:  09/30/23 71.7 kg  09/12/23 72.1 kg  08/07/23 72.1 kg     Exam General: Alert and oriented x 3, NAD, uncomfortable due to pain in his right foot Cardiovascular: S1 S2 auscultated,  RRR Respiratory: Clear to auscultation bilaterally Gastrointestinal: Soft, nontender, nondistended, + bowel sounds Ext: right foot swollen, TTP, ROM decreased due to pain Neuro: no new deficits Psych: Normal affect     Data Reviewed:  I have personally reviewed following labs    CBC Lab Results  Component Value Date   WBC 6.7 09/30/2023   RBC 5.26 09/30/2023   HGB 16.6 09/30/2023   HCT 49.3 09/30/2023   MCV 93.7 09/30/2023   MCH 31.6 09/30/2023   PLT 126 (L) 09/30/2023   MCHC 33.7 09/30/2023   RDW 14.4 09/30/2023   LYMPHSABS 1.1 09/30/2023   MONOABS 0.3 09/30/2023   EOSABS 0.1 09/30/2023   BASOSABS 0.0 09/30/2023     Last metabolic panel Lab Results  Component Value Date   NA 141 09/30/2023   K 4.3 09/30/2023   CL 107 09/30/2023   CO2 27 09/30/2023   BUN 15 09/30/2023   CREATININE 0.99 09/30/2023   GLUCOSE 109 (H) 09/30/2023   GFRNONAA >60 09/30/2023   GFRAA 60 (L) 02/28/2020   CALCIUM  9.6 09/30/2023   PHOS 2.4 (L) 08/07/2023   PROT 7.7 09/30/2023   ALBUMIN 3.9 09/30/2023   BILITOT 0.9 09/30/2023   ALKPHOS 63 09/30/2023   AST 30 09/30/2023   ALT 19 09/30/2023   ANIONGAP 7 09/30/2023    CBG (last 3)  No results for input(s): "GLUCAP" in the last 72 hours.    Coagulation Profile: Recent Labs  Lab 09/30/23 0020  INR 1.1     Radiology Studies: I have  personally reviewed the imaging studies  DG Chest Port 1 View Result Date: 09/30/2023 EXAM: 1 VIEW XRAY OF THE CHEST  09/30/2023 12:52:00 AM COMPARISON: 09/13/2023 CLINICAL HISTORY: Syncope ?pacer wires. Encounter for syncope and pacer wires. FINDINGS: LUNGS AND PLEURA: No consolidation. No pulmonary edema. No pleural effusion. No pneumothorax. HEART AND MEDIASTINUM: Left subclavian dual lead pacemaker in satisfactory position. BONES AND SOFT TISSUES: No acute osseous abnormality. Mild eventration of the right hemidiaphragm. IMPRESSION: 1. Left subclavian dual lead pacemaker in satisfactory position. Electronically signed by: Zadie Herter MD 09/30/2023 01:08 AM EDT RP Workstation: [id number]       Bertram Brocks M.D. [facility] 09/30/2023, 9:28 AM  Available via Epic secure chat 7am-7pm After 7 pm, please refer to night coverage provider listed on amion.

## 2023-09-30 NOTE — Consult Note (Addendum)
 Cardiology Consultation   Patient ID: Dustin Hamilton MRN: [id number]; DOB: 1941-05-21  Admit date: 09/29/2023 Date of Consult: 09/30/2023  PCP:  [facility]   [facility] Providers Cardiologist:  Lauro Portal, MD  Electrophysiologist:  Manya Sells, MD       Patient Profile:   Dustin Hamilton is a 83 y.o. male with a hx of prior SVT and heart block s/p PM who is being seen 09/30/2023 for the evaluation of syncope at the request of the ED.  History of Present Illness:   Dustin Hamilton had prior SVT s/p AVNRT ablation, intermittent heart block s/p PM insertion presented tonight w/ syncope, found by wife to be slumped over his walker and unresponsive. When EMS arrived, there was a report of HR in the 20s.  By time of ED arrival, patient was back to baseline mental status.  No reported chest discomfort preceding event but doesn't really recall events.  [place]ron[place] PM interrogated. In my review of the report he is vpaced 93% of the time and a paced approx 25% of the time.  No episodes were reported suggesting arrhythmia as cause of syncope.  His ECG here shows a v paced rhythm.  Troponin 18 with mild renal insufficiency.   Past Medical History:  Diagnosis Date   BPH (benign prostatic hyperplasia)    Cerebrovascular disease    Clostridium difficile diarrhea    Congenital anomaly of diaphragm    Elevated PSA    Glaucoma, both eyes    Hemorrhoid    Hepatitis B surface antigen positive    02-20-2011   History of adenomatous polyp of colon    2007, 2009 and 2013  tubular adenoma's   History of alcohol  abuse    quit 1963   History of cerebral parenchymal hemorrhage    01/ 2006  left occiptial lobe related to hypertensive crisis   History of CVA (cerebrovascular accident)    09-12-2012  left hippocampus/ amygdala junction and per MRI old white matter infarcts--  per pt residual short- term memory issues   History of fatty infiltration of liver hx visit's at  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   HTN (hypertension)    Renal cyst, left    Stroke (HCC)    hx of 3 strokes in past    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    Urethral lesion    urethral mass    Past Surgical History:  Procedure Laterality Date   CARDIOVASCULAR STRESS TEST  05/05/2007   normal nuclear study w/ no ischemia/  normal LV fucntion and wall motion , ef60%   COLONOSCOPY  last one 04-06-2012   CYSTO/  LEFT RETROGRADE PYELOGRAM/ CYTOLOGY WASHINGS/  URETEROSCOPY  03/05/2000   INGUINAL HERNIA REPAIR Bilateral 1965 and 1980's   IR 3D INDEPENDENT WKST  07/31/2022   IR ANGIOGRAM PELVIS SELECTIVE OR SUPRASELECTIVE  07/31/2022   IR ANGIOGRAM SELECTIVE EACH ADDITIONAL VESSEL  07/31/2022   IR ANGIOGRAM SELECTIVE EACH ADDITIONAL VESSEL  07/31/2022   IR ANGIOGRAM SELECTIVE EACH ADDITIONAL VESSEL  07/31/2022   IR ANGIOGRAM SELECTIVE EACH ADDITIONAL VESSEL  07/31/2022   IR EMBO TUMOR ORGAN ISCHEMIA INFARCT INC GUIDE ROADMAPPING  07/31/2022   IR RADIOLOGIST EVAL & MGMT  06/26/2022  IR RADIOLOGIST EVAL & MGMT  08/08/2022   IR RADIOLOGIST EVAL & MGMT  08/21/2022   IR RADIOLOGIST EVAL & MGMT  03/25/2023   IR US  GUIDE VASC ACCESS RIGHT  07/31/2022   LAPAROSCOPIC INGUINAL HERNIA WITH UMBILICAL HERNIA Right 06/24/2007   LIVER BIOPSY  1980's and 2004   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   Procedure: PACEMAKER IMPLANT;  Surgeon: Tammie Fall, MD;  Location: [facility];  Service: Cardiovascular;  Laterality: N/A;   SVT ABLATION N/A 11/15/2018   Procedure: SVT ABLATION;  Surgeon: Tammie Fall, MD;  Location: [facility];  Service: Cardiovascular;  Laterality: N/A;   TRANSTHORACIC ECHOCARDIOGRAM  09/13/2012   moderate LVH,  ef 60-65%/     TRANSURETHRAL RESECTION OF BLADDER TUMOR N/A  08/11/2016   Procedure: TRANSURETHRAL RESECTION OF BLADDER TUMOR (TURBT);  Surgeon: Marco Severs, MD;  Location: [facility];  Service: Urology;  Laterality: N/A;       Inpatient Medications: Scheduled Meds:  Continuous Infusions:  PRN Meds:   Allergies:    Allergies  Allergen Reactions   Penicillins Hives   Levetiracetam  Other (See Comments)    Altered mental status   Aricept  [Donepezil ] Other (See Comments)    Dizziness    Exelon [Rivastigmine] Other (See Comments)    Malaise    Viagra [Sildenafil] Palpitations and Other (See Comments)    Dizziness, also     Social History:   Social History   Socioeconomic History   Marital status: Married    Spouse name: Wallene Gum   Number of children: 1   Years of education: College   Highest education level: Not on file  Occupational History   Occupation: Environmental manager  Tobacco Use   Smoking status: Former    Current packs/day: 0.00    Average packs/day: 1 pack/day for 5.0 years (5.0 ttl pk-yrs)    Types: Cigarettes    Start date: 11/30/1976    Quit date: 11/30/1981    Years since quitting: 41.8   Smokeless tobacco: Never  Vaping Use   Vaping status: Never Used  Substance and Sexual Activity   Alcohol  use: No    Alcohol /week: 0.0 standard drinks of alcohol     Comment: hx abuse -- quit:  1963   Drug use: No    Comment: 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   Caffeine Use:  Tea, lots   Social Drivers of Health   Financial Resource Strain: Not on file  Food Insecurity: No Food Insecurity (08/06/2023)   Hunger Vital Sign    Worried About Running Out of Food in the Last Year: Never true    Ran Out of Food in the Last Year: Never true  Transportation Needs: No Transportation Needs (08/06/2023)   PRAPARE - Administrator, Civil Service (Medical): No     Lack of Transportation (Non-Medical): No  Physical Activity: Not on file  Stress: Not on file  Social Connections: Socially Integrated (08/06/2023)   Social Connection and Isolation Panel [NHANES]    Frequency of Communication with Friends and Family: More than three times a week    Frequency of Social Gatherings with Friends and Family: Twice a week    Attends Religious Services: More than 4 times per year    Active Member of [employer]  or Organizations: Yes    Attends Banker Meetings: More than 4 times per year    Marital Status: Married  Catering manager Violence: Not At Risk (08/06/2023)   Humiliation, Afraid, Rape, and Kick questionnaire    Fear of Current or Ex-Partner: No    Emotionally Abused: No    Physically Abused: No    Sexually Abused: No    Family History:    Family History  Problem Relation Age of Onset   Rheum arthritis Mother    Diabetes Mother    Stroke Mother    Heart attack Mother    Kidney failure Mother    Heart attack Father    Heart disease Maternal Grandmother    Rheum arthritis Maternal Grandmother    Diabetes Maternal Grandmother    Stroke Maternal Grandmother    Colon cancer Neg Hx      ROS:  Please see the history of present illness.   All other ROS reviewed and negative.     Physical Exam/Data:   Vitals:   09/30/23 0007 09/30/23 0008 09/30/23 0100 09/30/23 0230  BP: 121/75  112/73 123/79  Pulse: 73  63 60  Resp: 20  17 17   Temp: 97.8 F (36.6 C)     TempSrc: Oral     SpO2: 100%  98% 100%  Weight:  71.7 kg    Height:  5\' 11"  (1.803 m)     No intake or output data in the 24 hours ending 09/30/23 0324    09/30/2023   12:08 AM 09/12/2023    9:35 PM 08/07/2023    5:00 AM  Last 3 Weights  Weight (lbs) 158 lb 158 lb 15.2 oz 158 lb 15.2 oz  Weight (kg) 71.668 kg 72.1 kg 72.1 kg     Body mass index is 22.04 kg/m.  General:  Frail, well developed, in no acute distress HEENT: normal Neck: no JVD Vascular: No carotid bruits;  Distal pulses 2+ bilaterally Cardiac:  normal S1, S2; RRR; no murmur  Lungs:  clear to auscultation bilaterally, no wheezing, rhonchi or rales  Abd: soft, nontender, no hepatomegaly  Ext: no edema Musculoskeletal:  No deformities, BUE and BLE strength normal and equal Skin: warm and dry  Neuro:  CNs 2-12 intact, no focal abnormalities noted Psych:  Normal affect    Relevant CV Studies: Last echo in 08/2023 showed preserved EF, calcified aortic valve but no major valvular pathology  Laboratory Data:  High Sensitivity Troponin:   Recent Labs  Lab 09/30/23 0020  TROPONINIHS 18*     Chemistry Recent Labs  Lab 09/30/23 0020  NA 141  K 3.8  CL 106  CO2 27  GLUCOSE 136*  BUN 16  CREATININE 1.32*  CALCIUM  9.6  MG 1.9  GFRNONAA 54*  ANIONGAP 8    No results for input(s): "PROT", "ALBUMIN", "AST", "ALT", "ALKPHOS", "BILITOT" in the last 168 hours. Lipids No results for input(s): "CHOL", "TRIG", "HDL", "LABVLDL", "LDLCALC", "CHOLHDL" in the last 168 hours.  Hematology Recent Labs  Lab 09/30/23 0020  WBC 5.7  RBC 5.02  HGB 15.8  HCT 48.1  MCV 95.8  MCH 31.5  MCHC 32.8  RDW 14.4  PLT 109*   Thyroid  No results for input(s): "TSH", "FREET4" in the last 168 hours.  BNPNo results for input(s): "BNP", "PROBNP" in the last 168 hours.  DDimer No results for input(s): "DDIMER" in the last 168 hours.   Radiology/Studies:  [facility] Chest Port 1 View Result Date: 09/30/2023 EXAM:  1 VIEW XRAY OF THE CHEST 09/30/2023 12:52:00 AM COMPARISON: 09/13/2023 CLINICAL HISTORY: Syncope ?pacer wires. Encounter for syncope and pacer wires. FINDINGS: LUNGS AND PLEURA: No consolidation. No pulmonary edema. No pleural effusion. No pneumothorax. HEART AND MEDIASTINUM: Left subclavian dual lead pacemaker in satisfactory position. BONES AND SOFT TISSUES: No acute osseous abnormality. Mild eventration of the right hemidiaphragm. IMPRESSION: 1. Left subclavian dual lead pacemaker in satisfactory position.  Electronically signed by: Zadie Herter MD 09/30/2023 01:08 AM EDT RP Workstation: [id number]     Assessment and Plan:   Doubt arrhythmic etiology of syncope. Pacemaker functioning well. In looking at caardiology note from 07/2022, he has had a history of presyncope that was thought at that time to be attributed to symptomatic hypotension.  However, both amlodipine  and metoprolol  appears to have been discontinued after recent discharge on 08/07/2023, he was unable to tell me what meds he's taking.  BP is currently fine.  Will have EP take another look at the interrogation report in the AM to see if additional comments.  For now, no major cardiac recommendations      Risk Assessment/Risk Scores:                For questions or updates, please contact [facility] Please consult [email or web address] for contact info under    Signed, Philmore Bream, MD  09/30/2023 3:24 AM Addendum; EP review of device showed no significant arrhythmias. Alexandria Angel

## 2023-10-01 ENCOUNTER — Other Ambulatory Visit (HOSPITAL_COMMUNITY): Payer: Self-pay

## 2023-10-01 DIAGNOSIS — R7989 Other specified abnormal findings of blood chemistry: Secondary | ICD-10-CM

## 2023-10-01 DIAGNOSIS — Z950 Presence of cardiac pacemaker: Secondary | ICD-10-CM | POA: Diagnosis not present

## 2023-10-01 DIAGNOSIS — N179 Acute kidney failure, unspecified: Secondary | ICD-10-CM | POA: Diagnosis not present

## 2023-10-01 DIAGNOSIS — R55 Syncope and collapse: Secondary | ICD-10-CM | POA: Diagnosis not present

## 2023-10-01 LAB — BASIC METABOLIC PANEL WITH GFR
Anion gap: 8 (ref 5–15)
BUN: 13 mg/dL (ref 8–23)
CO2: 25 mmol/L (ref 22–32)
Calcium: 9.5 mg/dL (ref 8.9–10.3)
Chloride: 106 mmol/L (ref 98–111)
Creatinine, Ser: 0.95 mg/dL (ref 0.61–1.24)
GFR, Estimated: >60 mL/min (ref >60)
Glucose, Bld: 98 mg/dL (ref 70–99)
Potassium: 4.1 mmol/L (ref 3.5–5.1)
Sodium: 139 mmol/L (ref 135–145)

## 2023-10-01 LAB — CBC
HCT: 41.7 % (ref 39.0–52.0)
Hemoglobin: 14.3 g/dL (ref 13.0–17.0)
MCH: 31.7 pg (ref 26.0–34.0)
MCHC: 34.3 g/dL (ref 30.0–36.0)
MCV: 92.5 fL (ref 80.0–100.0)
Platelets: 132 10*3/uL — ABNORMAL LOW (ref 150–400)
RBC: 4.51 MIL/uL (ref 4.22–5.81)
RDW: 13.9 % (ref 11.5–15.5)
WBC: 6.8 10*3/uL (ref 4.0–10.5)
nRBC: 0 % (ref 0.0–0.2)

## 2023-10-01 LAB — GLUCOSE, CAPILLARY: Glucose-Capillary: 88 mg/dL (ref 70–99)

## 2023-10-01 MED ORDER — PREDNISONE 10 MG PO TABS
ORAL_TABLET | ORAL | 0 refills | Status: AC
  Filled 2023-10-01: qty 6, 3d supply, fill #0

## 2023-10-01 MED ORDER — COLCHICINE 0.6 MG PO TABS
0.6000 mg | ORAL_TABLET | Freq: Every day | ORAL | 0 refills | Status: DC
  Filled 2023-10-01: qty 30, 30d supply, fill #0

## 2023-10-01 NOTE — Progress Notes (Signed)
 PT Cancellation Note  Patient Details Name: Dustin Hamilton MRN: [id number] DOB: 04-22-1941   Cancelled Treatment:    Reason Eval/Treat Not Completed: Other (comment) (Pt seen yesterday by physical therapy and discharged. Moving well. Please re-consult if further needs arise.)  Sloan Duncans, DPT, CLT  Acute Rehabilitation Services Office: [phone] (Secure chat preferred)   Jenice Mitts 10/01/2023, 9:18 AM

## 2023-10-01 NOTE — Progress Notes (Signed)
 Rounding Note    Patient Name: Dustin Hamilton Date of Encounter: 10/01/2023  [facility] Cardiologist: Lauro Portal, MD   Subjective   No CP or dyspnea  Inpatient Medications    Scheduled Meds:  allopurinol   100 mg Oral Daily   aspirin   81 mg Oral Daily   brimonidine   1 drop Both Eyes BID   colchicine   0.6 mg Oral Daily   dorzolamide   1 drop Both Eyes BID   dutasteride   0.5 mg Oral q AM   enoxaparin  (LOVENOX ) injection  40 mg Subcutaneous Q24H   latanoprost   1 drop Both Eyes QHS   memantine   10 mg Oral BID   predniSONE   40 mg Oral QAC breakfast   Continuous Infusions:  PRN Meds: acetaminophen  **OR** acetaminophen , hydrALAZINE , traMADol    Vital Signs    Vitals:   09/30/23 1949 10/01/23 0011 10/01/23 0443 10/01/23 0825  BP: (!) 143/92 (!) 151/85 (!) 158/94 (!) 163/105  Pulse: 63     Resp: 15 13 16 17   Temp: (!) 97.5 F (36.4 C) 97.6 F (36.4 C) 97.6 F (36.4 C)   TempSrc: Oral Axillary Oral   SpO2: 100% 97% 100% 99%  Weight:      Height:        Intake/Output Summary (Last 24 hours) at 10/01/2023 0933 Last data filed at 10/01/2023 0445 Gross per 24 hour  Intake 1000 ml  Output 1150 ml  Net -150 ml      09/30/2023   12:08 AM 09/12/2023    9:35 PM 08/07/2023    5:00 AM  Last 3 Weights  Weight (lbs) 158 lb 158 lb 15.2 oz 158 lb 15.2 oz  Weight (kg) 71.668 kg 72.1 kg 72.1 kg      Telemetry    Sinus - Personally Reviewed  Physical Exam   GEN: No acute distress.   Neck: No JVD Cardiac: RRR, no murmurs, rubs, or gallops.  Respiratory: Clear to auscultation bilaterally. GI: Soft, nontender, non-distended  MS: No edema Neuro:  Nonfocal  Psych: Normal affect   Labs    High Sensitivity Troponin:   Recent Labs  Lab 09/30/23 0020 09/30/23 0235  TROPONINIHS 18* 18*     Chemistry Recent Labs  Lab 09/30/23 0020 09/30/23 0645 10/01/23 0511  NA 141 141 139  K 3.8 4.3 4.1  CL 106 107 106  CO2 27 27 25   GLUCOSE 136* 109* 98  BUN  16 15 13   CREATININE 1.32* 0.99 0.95  CALCIUM  9.6 9.6 9.5  MG 1.9 1.9  --   PROT  --  7.7  --   ALBUMIN  --  3.9  --   AST  --  30  --   ALT  --  19  --   ALKPHOS  --  63  --   BILITOT  --  0.9  --   GFRNONAA 54* >60 >60  ANIONGAP 8 7 8     Hematology Recent Labs  Lab 09/30/23 0020 09/30/23 0505 10/01/23 0511  WBC 5.7 6.7 6.8  RBC 5.02 5.26 4.51  HGB 15.8 16.6 14.3  HCT 48.1 49.3 41.7  MCV 95.8 93.7 92.5  MCH 31.5 31.6 31.7  MCHC 32.8 33.7 34.3  RDW 14.4 14.4 13.9  PLT 109* 126* 132*   Thyroid   Recent Labs  Lab 09/30/23 0645  TSH 1.267     Radiology    DG Foot 2 Views Right Result Date: 09/30/2023 CLINICAL DATA:  [id number] Pain [id number] EXAM: RIGHT  FOOT - 2 VIEW COMPARISON:  February 08, 2023 FINDINGS: Diffuse osteopenia. No acute fracture or dislocation. Mild degenerative changes of the midfoot. Mild soft tissue swelling about the foot. No radiopaque foreign body. IMPRESSION: Mild soft tissue swelling about the foot. No acute fracture or dislocation. Electronically Signed   By: Rance Burrows M.D.   On: 09/30/2023 10:08   DG Chest Port 1 View Result Date: 09/30/2023 EXAM: 1 VIEW XRAY OF THE CHEST 09/30/2023 12:52:00 AM COMPARISON: 09/13/2023 CLINICAL HISTORY: Syncope ?pacer wires. Encounter for syncope and pacer wires. FINDINGS: LUNGS AND PLEURA: No consolidation. No pulmonary edema. No pleural effusion. No pneumothorax. HEART AND MEDIASTINUM: Left subclavian dual lead pacemaker in satisfactory position. BONES AND SOFT TISSUES: No acute osseous abnormality. Mild eventration of the right hemidiaphragm. IMPRESSION: 1. Left subclavian dual lead pacemaker in satisfactory position. Electronically signed by: Zadie Herter MD 09/30/2023 01:08 AM EDT RP Workstation: [id number]    Patient Profile     83 y.o. male with past medical history of SVT, prior pacemaker, history of CVA admitted with syncope.  Echocardiogram March 2025 showed normal LV function, mild left ventricular  hypertrophy and aortic sclerosis.  Patient's pacemaker interrogated and no significant arrhythmias found.  Assessment & Plan    1 syncope-pacemaker was interrogated and no significant arrhythmias noted.  Events felt possibly secondary to combination of hypotension and dehydration.  No further cardiac workup indicated.  2 hypertension-patient's blood pressure is elevated.  His amlodipine  and metoprolol  were held at time of admission.  Would resume amlodipine  5 mg daily and continue off metoprolol  as low blood pressure was felt to potentially contribute to his presentation.  Follow blood pressure and adjust regimen as an outpatient.  3 status post pacemaker-follow-up electrophysiology.  Patient can be discharged from a cardiac standpoint.  Will arrange follow-up with APP in 2 to 4 weeks.  For questions or updates, please contact [facility] Please consult [email or web address] for contact info under        Signed, Alexandria Angel, MD  10/01/2023, 9:33 AM

## 2023-10-01 NOTE — TOC Transition Note (Signed)
 Transition of Care [facility]) - Discharge Note   Patient Details  Name: Dustin Hamilton MRN: [id number] Date of Birth: [birth year]  Transition of Care [facility]) CM/SW Contact:  Eusebio High, RN Phone Number: 10/01/2023, 10:43 AM   Clinical Narrative:     Patient to dc to home today No TOC needs          Patient Goals and CMS Choice            Discharge Placement                       Discharge Plan and Services Additional resources added to the After Visit Summary for                                       Social Drivers of Health (SDOH) Interventions SDOH Screenings   Food Insecurity: Patient Unable To Answer (10/01/2023)  Housing: Patient Unable To Answer (10/01/2023)  Transportation Needs: Patient Unable To Answer (10/01/2023)  Utilities: Patient Unable To Answer (10/01/2023)  Social Connections: Patient Unable To Answer (10/01/2023)  Tobacco Use: Medium Risk (09/30/2023)     Readmission Risk Interventions     No data to display

## 2023-10-01 NOTE — Plan of Care (Signed)

## 2023-10-01 NOTE — Care Management Obs Status (Signed)
 MEDICARE OBSERVATION STATUS NOTIFICATION   Patient Details  Name: Dustin Hamilton MRN: [id number] Date of Birth: 1941/05/16   Medicare Observation Status Notification Given:  Yes  Moon/obs letter done and copy given  Wynonia Hedges 10/01/2023, 12:59 PM

## 2023-10-01 NOTE — Discharge Summary (Signed)
 PATIENT DETAILS Name: Dustin Hamilton Age: 83 y.o. Sex: male Date of Birth: [birth year] MRN: [id number]. Admitting Physician: Angelene Kelly, MD WUJ:[id number], Nada Auer  Admit Date: 09/29/2023 Discharge date: 10/01/2023  Recommendations for Outpatient Follow-up:  Follow up with PCP in 1-2 weeks Please obtain CMP/CBC in one week  Admitted From:  Home  Disposition: Home   Discharge Condition: good  CODE STATUS:   Code Status: Full Code   Diet recommendation:  Diet Order             Diet - low sodium heart healthy           Diet Heart Room service appropriate? Yes; Fluid consistency: Thin  Diet effective now                    Brief Summary: 83 year old with history of complete heart block-s/p PPM implantation-presented with a syncopal episode.  Brief Hospital Course: Syncope Suspicious of orthostatic hypotension-in setting of poor oral intake. PPM interrogation unremarkable Telemetry overnight unremarkable Recent echo with stable EF Discussed with cardiology-okay to discharge.  Acute gout flare Clinically improved Prednisone  taper Continue allopurinol   AKI Mild Hemodynamically mediated in setting of poor oral intake Resolved Follow electrolytes periodically in the outpatient setting  HTN Has resting hypertension but suspicious for orthostatic hypotension Resume amlodipine  5 mg daily Continue to hold off on metoprolol  Follow-up with PCP/cardiology for further optimization but would allow some amount of permissive hypertension  History of complete heart block-s/p pacemaker implantation See above  History of CVA ASA  Dementia Stable Namenda   Discharge Diagnoses:  Principal Problem:   Syncope Active Problems:   Essential hypertension   SVT (supraventricular tachycardia) (HCC)   History of stroke   AKI (acute kidney injury) (HCC)   Dementia (HCC)   Acute gout   Discharge Instructions:  Activity:  As tolerated   Discharge Instructions     Call MD for:  extreme fatigue   Complete by: As directed    Call MD for:  persistant dizziness or light-headedness   Complete by: As directed    Diet - low sodium heart healthy   Complete by: As directed    Discharge instructions   Complete by: As directed    Follow with Primary MD  [facility], [facility] in 1-2 weeks  Please get a complete blood count and chemistry panel checked by your Primary MD at your next visit, and again as instructed by your Primary MD.  Get Medicines reviewed and adjusted: Please take all your medications with you for your next visit with your Primary MD  Laboratory/radiological data: Please request your Primary MD to go over all hospital tests and procedure/radiological results at the follow up, please ask your Primary MD to get all Hospital records sent to his/her office.  In some cases, they will be blood work, cultures and biopsy results pending at the time of your discharge. Please request that your primary care M.D. follows up on these results.  Also Note the following: If you experience worsening of your admission symptoms, develop shortness of breath, life threatening emergency, suicidal or homicidal thoughts you must seek medical attention immediately by calling 911 or calling your MD immediately  if symptoms less severe.  You must read complete instructions/literature along with all the possible adverse reactions/side effects for all the Medicines you take and that have been prescribed to you. Take any new Medicines after you have completely understood and accpet all the possible adverse reactions/side effects.  Do not drive when taking Pain medications or sleeping medications (Benzodaizepines)  Do not take more than prescribed Pain, Sleep and Anxiety Medications. It is not advisable to combine anxiety,sleep and pain medications without talking with your primary care practitioner  Special Instructions: If you have  smoked or chewed Tobacco  in the last 2 yrs please stop smoking, stop any regular Alcohol   and or any Recreational drug use.  Wear Seat belts while driving.  Please note: You were cared for by a hospitalist during your hospital stay. Once you are discharged, your primary care physician will handle any further medical issues. Please note that NO REFILLS for any discharge medications will be authorized once you are discharged, as it is imperative that you return to your primary care physician (or establish a relationship with a primary care physician if you do not have one) for your post hospital discharge needs so that they can reassess your need for medications and monitor your lab values.   Non-Pharmacologic Reccomendations for Orthostatic Hypotension 1) Lifestyle modification. These measures include:             - Arising slowly, in stages, from supine to seated to standing. This maneuver is most important in the morning, when orthostatic tolerance is lowest.             - Avoiding straining, coughing, and walking in hot weather.These activities reduce venous return and worsen orthostatic hypotension.             - Maintaining hydration and avoiding over-heating.             - Raising the head of the bed 30 to 45 degrees decreases renal perfusion, thereby activating the renin-angiotensin-aldosterone system and decreasing nocturnal diuresis, which can be pronounced in these patients.  These changes relieve orthostatic hypotension by expanding extracellular fluid volume and may reduce end organ damage by reducing supine HTN.             2) Exercise - walking 30 minutes a day, exercise in a swimming pool, exercise in a recumbent or seated position (using a stationary bike or rowing machine)             3) Abdominal binders              4) Compression Stockings             5) Increased salt and water intake to 2 L to 2.5 L of water a day             6) Modification of meals:             - Avoiding  large meals             - Ingesting meals low in carbohydrates             - Alcohol  should be avoided during the day as it is a vasodilator             - Drink water with meals             - Avoiding activities or sudden standing immediately after eating             7) Physical Countermaneuvers during daily activities: leg crossing, standing on tip toes, squatting   Increase activity slowly   Complete by: As directed       Allergies as of 10/01/2023       Reactions   Penicillins Hives   Levetiracetam  Other (  See Comments)   Altered mental status   Aricept  [donepezil ] Other (See Comments)   Dizziness   Exelon [rivastigmine] Other (See Comments)   Malaise   Viagra [sildenafil] Palpitations, Other (See Comments)   Dizziness, also        Medication List     STOP taking these medications    metoprolol  succinate 25 MG 24 hr tablet Commonly known as: TOPROL -XL       TAKE these medications    allopurinol  100 MG tablet Commonly known as: Zyloprim  Take 1 tablet (100 mg total) by mouth daily.   amLODipine  10 MG tablet Commonly known as: NORVASC  Take 5 mg by mouth daily.   aspirin  81 MG chewable tablet Chew 1 tablet (81 mg total) by mouth daily.   brimonidine  0.2 % ophthalmic solution Commonly known as: ALPHAGAN  Place 1 drop into both eyes 2 (two) times daily.   colchicine  0.6 MG tablet Take 1 tablet (0.6 mg total) by mouth daily. Start taking on: Oct 02, 2023   dorzolamide  2 % ophthalmic solution Commonly known as: TRUSOPT  Place 1 drop into both eyes 2 (two) times daily.   dutasteride  0.5 MG capsule Commonly known as: AVODART  Take 0.5 mg by mouth in the morning.   latanoprost  0.005 % ophthalmic solution Commonly known as: XALATAN  Place 1 drop into both eyes at bedtime.   melatonin 3 MG Tabs tablet Take 3 mg by mouth at bedtime.   memantine  10 MG tablet Commonly known as: NAMENDA  Take 10 mg by mouth 2 (two) times daily.   predniSONE  10 MG  tablet Commonly known as: DELTASONE  Take 30 mg daily for 1 day, 20 mg daily for 1 days,10 mg daily for 1 day, then stop Start taking on: Oct 02, 2023   tamsulosin  0.4 MG Caps capsule Commonly known as: FLOMAX  Take 0.4 mg by mouth at bedtime.        Allergies  Allergen Reactions   Penicillins Hives   Levetiracetam  Other (See Comments)    Altered mental status   Aricept  [Donepezil ] Other (See Comments)    Dizziness    Exelon [Rivastigmine] Other (See Comments)    Malaise    Viagra [Sildenafil] Palpitations and Other (See Comments)    Dizziness, also      Other Procedures/Studies: DG Foot 2 Views Right Result Date: 09/30/2023 CLINICAL DATA:  [id number] Pain [id number] EXAM: RIGHT FOOT - 2 VIEW COMPARISON:  February 08, 2023 FINDINGS: Diffuse osteopenia. No acute fracture or dislocation. Mild degenerative changes of the midfoot. Mild soft tissue swelling about the foot. No radiopaque foreign body. IMPRESSION: Mild soft tissue swelling about the foot. No acute fracture or dislocation. Electronically Signed   By: Rance Burrows M.D.   On: 09/30/2023 10:08   DG Chest Port 1 View Result Date: 09/30/2023 EXAM: 1 VIEW XRAY OF THE CHEST 09/30/2023 12:52:00 AM COMPARISON: 09/13/2023 CLINICAL HISTORY: Syncope ?pacer wires. Encounter for syncope and pacer wires. FINDINGS: LUNGS AND PLEURA: No consolidation. No pulmonary edema. No pleural effusion. No pneumothorax. HEART AND MEDIASTINUM: Left subclavian dual lead pacemaker in satisfactory position. BONES AND SOFT TISSUES: No acute osseous abnormality. Mild eventration of the right hemidiaphragm. IMPRESSION: 1. Left subclavian dual lead pacemaker in satisfactory position. Electronically signed by: Zadie Herter MD 09/30/2023 01:08 AM EDT RP Workstation: [id number]   DG Chest Portable 1 View Result Date: 09/13/2023 CLINICAL DATA:  ams EXAM: PORTABLE CHEST 1 VIEW COMPARISON:  Chest x-ray 08/06/2023 FINDINGS: Left chest 2 lead pacemaker. The heart and  mediastinal contours are  within normal limits. Elevated right hemidiaphragm. No focal consolidation. No pulmonary edema. No pleural effusion. No pneumothorax. No acute osseous abnormality. IMPRESSION: No active disease. Electronically Signed   By: Morgane  Naveau M.D.   On: 09/13/2023 00:46   CT HEAD WO CONTRAST ( ) Result Date: 09/13/2023 CLINICAL DATA:  Initial evaluation for acute delirium. EXAM: CT HEAD WITHOUT CONTRAST TECHNIQUE: Contiguous axial images were obtained from the base of the skull through the vertex without intravenous contrast. RADIATION DOSE REDUCTION: This exam was performed according to the departmental dose-optimization program which includes automated exposure control, adjustment of the mA and/or kV according to patient size and/or use of iterative reconstruction technique. COMPARISON:  CT from 08/06/2023 FINDINGS: Brain: Generalized age-related cerebral atrophy. Patchy hypodensity involving the supratentorial cerebral white matter, consistent with chronic small vessel ischemic disease, moderately advanced in nature. Possible chronic left PCA distribution infarct noted. No acute intracranial hemorrhage. No acute large vessel territory infarct. Ventricular prominence somewhat out of proportion to cortical sulcation, stable. No extra-axial fluid collection. Vascular: No abnormal hyperdense vessel. Calcified atherosclerosis present at skull base. Skull: Scalp soft tissues demonstrate no acute finding. Calvarium intact. Sinuses/Orbits: Globes orbital soft tissues within normal limits. Paranasal sinuses are largely clear. Trace chronic appearing right mastoid effusions, of doubtful significance. Other: None. IMPRESSION: 1. No acute intracranial abnormality. 2. Age-related cerebral atrophy with moderate chronic small vessel ischemic disease, with probable chronic left PCA distribution infarct, stable. 3. Ventricular prominence somewhat out of proportion to cortical sulcation, stable. While  this finding could be related to atrophy, possible normal pressure hydrocephalus could also have this appearance, and could be considered in the appropriate clinical setting. Electronically Signed   By: Virgia Griffins M.D.   On: 09/13/2023 00:15     TODAY-DAY OF DISCHARGE:  Subjective:   Dustin Hamilton today has no headache,no chest abdominal pain,no new weakness tingling or numbness, feels much better wants to go home today.  Objective:   Blood pressure (!) 163/105, pulse 63, temperature 97.6 F (36.4 C), temperature source Oral, resp. rate 17, height 5\' 11"  (1.803 m), weight 71.7 kg, SpO2 99%.  Intake/Output Summary (Last 24 hours) at 10/01/2023 1004 Last data filed at 10/01/2023 0445 Gross per 24 hour  Intake 1000 ml  Output 1150 ml  Net -150 ml   Filed Weights   09/30/23 0008  Weight: 71.7 kg    Exam: Awake Alert, Oriented *3, No new F.N deficits, Normal affect [place].AT,PERRAL Supple Neck,No JVD, No cervical lymphadenopathy appriciated.  Symmetrical Chest wall movement, Good air movement bilaterally, CTAB RRR,No Gallops,Rubs or new Murmurs, No Parasternal Heave +ve B.Sounds, Abd Soft, Non tender, No organomegaly appriciated, No rebound -guarding or rigidity. No Cyanosis, Clubbing or edema, No new Rash or bruise   PERTINENT RADIOLOGIC STUDIES: DG Foot 2 Views Right Result Date: 09/30/2023 CLINICAL DATA:  [id number] Pain [id number] EXAM: RIGHT FOOT - 2 VIEW COMPARISON:  February 08, 2023 FINDINGS: Diffuse osteopenia. No acute fracture or dislocation. Mild degenerative changes of the midfoot. Mild soft tissue swelling about the foot. No radiopaque foreign body. IMPRESSION: Mild soft tissue swelling about the foot. No acute fracture or dislocation. Electronically Signed   By: Rance Burrows M.D.   On: 09/30/2023 10:08   DG Chest Port 1 View Result Date: 09/30/2023 EXAM: 1 VIEW XRAY OF THE CHEST 09/30/2023 12:52:00 AM COMPARISON: 09/13/2023 CLINICAL HISTORY: Syncope ?pacer wires.  Encounter for syncope and pacer wires. FINDINGS: LUNGS AND PLEURA: No consolidation. No pulmonary edema. No pleural effusion. No pneumothorax. HEART AND  MEDIASTINUM: Left subclavian dual lead pacemaker in satisfactory position. BONES AND SOFT TISSUES: No acute osseous abnormality. Mild eventration of the right hemidiaphragm. IMPRESSION: 1. Left subclavian dual lead pacemaker in satisfactory position. Electronically signed by: Zadie Herter MD 09/30/2023 01:08 AM EDT RP Workstation: [id number]     PERTINENT LAB RESULTS: CBC: Recent Labs    09/30/23 0505 10/01/23 0511  WBC 6.7 6.8  HGB 16.6 14.3  HCT 49.3 41.7  PLT 126* 132*   CMET CMP     Component Value Date/Time   NA 139 10/01/2023 0511   NA 143 11/12/2018 0950   K 4.1 10/01/2023 0511   CL 106 10/01/2023 0511   CO2 25 10/01/2023 0511   GLUCOSE 98 10/01/2023 0511   BUN 13 10/01/2023 0511   BUN 15 11/12/2018 0950   CREATININE 0.95 10/01/2023 0511   CALCIUM  9.5 10/01/2023 0511   PROT 7.7 09/30/2023 0645   ALBUMIN 3.9 09/30/2023 0645   AST 30 09/30/2023 0645   ALT 19 09/30/2023 0645   ALKPHOS 63 09/30/2023 0645   BILITOT 0.9 09/30/2023 0645   GFR 102.85 05/05/2012 0826   GFRNONAA >60 10/01/2023 0511    GFR Estimated Creatinine Clearance: 60.8 mL/min (by C-G formula based on SCr of 0.95 mg/dL). No results for input(s): "LIPASE", "AMYLASE" in the last 72 hours. No results for input(s): "CKTOTAL", "CKMB", "CKMBINDEX", "TROPONINI" in the last 72 hours. Invalid input(s): "POCBNP" No results for input(s): "DDIMER" in the last 72 hours. No results for input(s): "HGBA1C" in the last 72 hours. No results for input(s): "CHOL", "HDL", "LDLCALC", "TRIG", "CHOLHDL", "LDLDIRECT" in the last 72 hours. Recent Labs    09/30/23 0645  TSH 1.267   No results for input(s): "VITAMINB12", "FOLATE", "FERRITIN", "TIBC", "IRON", "RETICCTPCT" in the last 72 hours. Coags: Recent Labs    09/30/23 0020  INR 1.1   Microbiology: No  results found for this or any previous visit (from the past 240 hours).  FURTHER DISCHARGE INSTRUCTIONS:  Get Medicines reviewed and adjusted: Please take all your medications with you for your next visit with your Primary MD  Laboratory/radiological data: Please request your Primary MD to go over all hospital tests and procedure/radiological results at the follow up, please ask your Primary MD to get all Hospital records sent to his/her office.  In some cases, they will be blood work, cultures and biopsy results pending at the time of your discharge. Please request that your primary care M.D. goes through all the records of your hospital data and follows up on these results.  Also Note the following: If you experience worsening of your admission symptoms, develop shortness of breath, life threatening emergency, suicidal or homicidal thoughts you must seek medical attention immediately by calling 911 or calling your MD immediately  if symptoms less severe.  You must read complete instructions/literature along with all the possible adverse reactions/side effects for all the Medicines you take and that have been prescribed to you. Take any new Medicines after you have completely understood and accpet all the possible adverse reactions/side effects.   Do not drive when taking Pain medications or sleeping medications (Benzodaizepines)  Do not take more than prescribed Pain, Sleep and Anxiety Medications. It is not advisable to combine anxiety,sleep and pain medications without talking with your primary care practitioner  Special Instructions: If you have smoked or chewed Tobacco  in the last 2 yrs please stop smoking, stop any regular Alcohol   and or any Recreational drug use.  Wear Seat belts while  driving.  Please note: You were cared for by a hospitalist during your hospital stay. Once you are discharged, your primary care physician will handle any further medical issues. Please note that NO  REFILLS for any discharge medications will be authorized once you are discharged, as it is imperative that you return to your primary care physician (or establish a relationship with a primary care physician if you do not have one) for your post hospital discharge needs so that they can reassess your need for medications and monitor your lab values.  Total Time spent coordinating discharge including counseling, education and face to face time equals greater than 30 minutes.  Signed: Momodou Hamilton 10/01/2023 10:04 AM

## 2023-10-06 ENCOUNTER — Emergency Department (HOSPITAL_COMMUNITY)

## 2023-10-06 ENCOUNTER — Encounter (HOSPITAL_COMMUNITY): Payer: Self-pay | Admitting: Emergency Medicine

## 2023-10-06 ENCOUNTER — Other Ambulatory Visit: Payer: Self-pay

## 2023-10-06 ENCOUNTER — Emergency Department (HOSPITAL_COMMUNITY)
Admission: EM | Admit: 2023-10-06 | Discharge: 2023-10-07 | Disposition: A | Discharge status: Home / Self Care | Attending: Emergency Medicine | Admitting: Emergency Medicine

## 2023-10-06 DIAGNOSIS — F0390 Unspecified dementia without behavioral disturbance: Secondary | ICD-10-CM | POA: Diagnosis not present

## 2023-10-06 DIAGNOSIS — Z79899 Other long term (current) drug therapy: Secondary | ICD-10-CM | POA: Insufficient documentation

## 2023-10-06 DIAGNOSIS — E876 Hypokalemia: Secondary | ICD-10-CM | POA: Insufficient documentation

## 2023-10-06 DIAGNOSIS — Z950 Presence of cardiac pacemaker: Secondary | ICD-10-CM | POA: Insufficient documentation

## 2023-10-06 DIAGNOSIS — I10 Essential (primary) hypertension: Secondary | ICD-10-CM | POA: Diagnosis not present

## 2023-10-06 DIAGNOSIS — E871 Hypo-osmolality and hyponatremia: Secondary | ICD-10-CM | POA: Insufficient documentation

## 2023-10-06 DIAGNOSIS — R7989 Other specified abnormal findings of blood chemistry: Secondary | ICD-10-CM

## 2023-10-06 DIAGNOSIS — M791 Myalgia, unspecified site: Secondary | ICD-10-CM | POA: Insufficient documentation

## 2023-10-06 DIAGNOSIS — R739 Hyperglycemia, unspecified: Secondary | ICD-10-CM

## 2023-10-06 DIAGNOSIS — Z7982 Long term (current) use of aspirin: Secondary | ICD-10-CM | POA: Diagnosis not present

## 2023-10-06 LAB — BASIC METABOLIC PANEL WITH GFR
Anion gap: 9 (ref 5–15)
BUN: 19 mg/dL (ref 8–23)
CO2: 25 mmol/L (ref 22–32)
Calcium: 9.5 mg/dL (ref 8.9–10.3)
Chloride: 100 mmol/L (ref 98–111)
Creatinine, Ser: 1.07 mg/dL (ref 0.61–1.24)
GFR, Estimated: >60 mL/min (ref >60)
Glucose, Bld: 138 mg/dL — ABNORMAL HIGH (ref 70–99)
Potassium: 3.3 mmol/L — ABNORMAL LOW (ref 3.5–5.1)
Sodium: 134 mmol/L — ABNORMAL LOW (ref 135–145)

## 2023-10-06 LAB — CBC
HCT: 47.8 % (ref 39.0–52.0)
Hemoglobin: 16.4 g/dL (ref 13.0–17.0)
MCH: 32.3 pg (ref 26.0–34.0)
MCHC: 34.3 g/dL (ref 30.0–36.0)
MCV: 94.3 fL (ref 80.0–100.0)
Platelets: 143 10*3/uL — ABNORMAL LOW (ref 150–400)
RBC: 5.07 MIL/uL (ref 4.22–5.81)
RDW: 13.8 % (ref 11.5–15.5)
WBC: 7.8 10*3/uL (ref 4.0–10.5)
nRBC: 0 % (ref 0.0–0.2)

## 2023-10-06 LAB — TROPONIN I (HIGH SENSITIVITY): Troponin I (High Sensitivity): 21 ng/L — ABNORMAL HIGH (ref <18)

## 2023-10-06 NOTE — ED Provider Notes (Signed)
  EMERGENCY DEPARTMENT AT [facility] Provider Note   CSN: [id number] Arrival date & time: 10/06/23  2124     History  Chief Complaint  Patient presents with   Arm Pain   Chest Pain    Dustin Hamilton is a 83 y.o. male.  The history is provided by the spouse and the patient. The history is limited by the condition of the patient (Dementia).  Arm Pain Associated symptoms include chest pain.  Chest Pain He has history of hypertension, stroke, cardiac pacemaker, dementia and apparently had been complaining that he just did not feel well today.  He was hurting but could not state where he was hurting.  Wife states that he was pacing at home.  He had recently been started on prednisone  for gout in his right foot.  He has not run running a fever and there has been no cough and no vomiting or diarrhea.  Patient states that he is not hurting anywhere now.   Home Medications Prior to Admission medications   Medication Sig Start Date End Date Taking? Authorizing Provider  allopurinol  (ZYLOPRIM ) 100 MG tablet Take 1 tablet (100 mg total) by mouth daily. 08/07/23 08/06/24  Krishnan, Gokul, MD  amLODipine  (NORVASC ) 10 MG tablet Take 5 mg by mouth daily.    [provider]  aspirin  81 MG chewable tablet Chew 1 tablet (81 mg total) by mouth daily. 03/22/23   Elgergawy, Ardia Kraft, MD  brimonidine  (ALPHAGAN ) 0.2 % ophthalmic solution Place 1 drop into both eyes 2 (two) times daily. 10/19/22   Sridharan, Sriramkumar, MD  colchicine  0.6 MG tablet Take 1 tablet (0.6 mg total) by mouth daily. 10/02/23   Ghimire, Estil Heman, MD  dorzolamide  (TRUSOPT ) 2 % ophthalmic solution Place 1 drop into both eyes 2 (two) times daily.    [provider]  dutasteride  (AVODART ) 0.5 MG capsule Take 0.5 mg by mouth in the morning.    [provider]  latanoprost  (XALATAN ) 0.005 % ophthalmic solution Place 1 drop into both eyes at bedtime.    [provider]  melatonin 3  MG TABS tablet Take 3 mg by mouth at bedtime.    [provider]  memantine  (NAMENDA ) 10 MG tablet Take 10 mg by mouth 2 (two) times daily.    [provider]  tamsulosin  (FLOMAX ) 0.4 MG CAPS capsule Take 0.4 mg by mouth at bedtime.    [provider]      Allergies    Penicillins, Levetiracetam , Aricept  [donepezil ], Exelon [rivastigmine], and Viagra [sildenafil]    Review of Systems   Review of Systems  Unable to perform ROS: Dementia  Cardiovascular:  Positive for chest pain.    Physical Exam Updated Vital Signs BP 135/78   Pulse 66   Temp 98.3 F (36.8 C)   Resp 16   SpO2 97%  Physical Exam Vitals and nursing note reviewed.   83 year old male, resting comfortably and in no acute distress. Vital signs are normal. Oxygen saturation is 97%, which is normal. Head is normocephalic and atraumatic. PERRLA, EOMI.  Neck is nontender and supple. Back is nontender and there is no CVA tenderness. Lungs are clear without rales, wheezes, or rhonchi. Chest is nontender.  Pacemaker is present on the left side. Heart has regular rate and rhythm without murmur. Abdomen is soft, flat, nontender. Extremities have no cyanosis or edema, full range of motion is present.  No erythema or warmth or tenderness to suggest active gout.  Skin is warm and dry without rash. Neurologic: Awake and alert, oriented to person and place but not time, cranial nerves are intact, moves all extremities equally.  ED Results / Procedures / Treatments   Labs (all labs ordered are listed, but only abnormal results are displayed) Labs Reviewed  BASIC METABOLIC PANEL WITH GFR - Abnormal; Notable for the following components:      Result Value   Sodium 134 (*)    Potassium 3.3 (*)    Glucose, Bld 138 (*)    All other components within normal limits  CBC - Abnormal; Notable for the following components:   Platelets 143 (*)    All other components within normal limits  URINALYSIS, W/  REFLEX TO CULTURE (INFECTION SUSPECTED) - Abnormal; Notable for the following components:   Color, Urine COLORLESS (*)    Specific Gravity, Urine 1.002 (*)    Hgb urine dipstick SMALL (*)    Leukocytes,Ua SMALL (*)    All other components within normal limits  TROPONIN I (HIGH SENSITIVITY) - Abnormal; Notable for the following components:   Troponin I (High Sensitivity) 21 (*)    All other components within normal limits  TROPONIN I (HIGH SENSITIVITY) - Abnormal; Notable for the following components:   Troponin I (High Sensitivity) 25 (*)    All other components within normal limits    EKG EKG Interpretation Date/Time:  Tuesday Oct 06 2023 22:12:48 EDT Ventricular Rate:  89 PR Interval:    QRS Duration:  162 QT Interval:  375 QTC Calculation: 378 R Axis:   246  Text Interpretation: Atrial-sensed ventricular-paced rhythm Suspect Mobitz 1 AV block No further rhythm analysis attempted due to paced rhythm When compared with ECG of 09/30/2023 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 Stable chronic elevation of the right hemidiaphragm. Heart and mediastinal contours are within normal limits. No focal opacities or effusions. No acute bony abnormality. IMPRESSION: No active cardiopulmonary disease. Electronically Signed   By: Janeece Mechanic M.D.   On: 10/06/2023 22:30    Procedures Procedures  Cardiac monitor shows atrial sensed ventricular paced rhythm, per my interpretation.  Medications Ordered in ED Medications  potassium chloride  SA (KLOR-CON  M) CR tablet 40 mEq (40 mEq Oral Given 10/07/23 0007)    ED Course/ Medical Decision Making/ A&P                                 Medical Decision Making Amount and/or Complexity of Data  Reviewed Labs: ordered. Radiology: ordered.  Risk Prescription drug management.   Report of hurting at home with benign exam.  Wife states that his mental status is at his baseline.  I have reviewed his electrocardiogram, and my interpretation is atrial sensed ventricular paced rhythm with some irregular ventricular rhythm suggesting of Mobitz 1 AV block but not significantly changed from prior.  I have reviewed his laboratory tests, and my interpretation is mild hyponatremia which is not felt to be clinically significant, mild hypokalemia, elevated random glucose level which will need to be followed as an outpatient, normal CBC.  Initial troponin is mildly elevated at 21 with repeat pending.  On review of past records, he frequently runs a mildly elevated  troponin in a similar range.  I have ordered a urinalysis to rule out occult UTI.  Chest x-ray shows no active cardiopulmonary disease.  I have independently viewed the images, and agree with the radiologist's interpretation.  I have ordered a dose of oral potassium.  Urinalysis shows no evidence of UTI.  Repeat troponin is essentially unchanged.  He continues to be pain-free.  I am not sure what was causing his symptoms at home, but evaluation here shows no significant pathology and I am discharging him.  I have instructed his wife to return if he has any new or concerning symptoms at home.  Final Clinical Impression(s) / ED Diagnoses Final diagnoses:  Myalgia  Hyponatremia  Hypokalemia  Elevated random blood glucose level  Elevated troponin    Rx / DC Orders ED Discharge Orders     None         Alissa April, MD 10/07/23 [phone]

## 2023-10-06 NOTE — ED Notes (Signed)
Resting in bed with family at bedside.

## 2023-10-06 NOTE — ED Triage Notes (Signed)
 Pt POV c/o pain in right arm and chest area. States pain started today, denies fever, N/V. A&Ox2, hx of dementia.

## 2023-10-07 LAB — URINALYSIS, W/ REFLEX TO CULTURE (INFECTION SUSPECTED)
Bacteria, UA: NONE SEEN
Bilirubin Urine: NEGATIVE
Glucose, UA: NEGATIVE mg/dL
Ketones, ur: NEGATIVE mg/dL
Nitrite: NEGATIVE
Protein, ur: NEGATIVE mg/dL
Specific Gravity, Urine: 1.002 — ABNORMAL LOW (ref 1.005–1.030)
pH: 7 (ref 5.0–8.0)

## 2023-10-07 LAB — TROPONIN I (HIGH SENSITIVITY): Troponin I (High Sensitivity): 25 ng/L — ABNORMAL HIGH (ref <18)

## 2023-10-07 MED ORDER — POTASSIUM CHLORIDE CRYS ER 20 MEQ PO TBCR
40.0000 meq | EXTENDED_RELEASE_TABLET | Freq: Once | ORAL | Status: AC
  Administered 2023-10-07: 40 meq via ORAL
  Filled 2023-10-07: qty 2

## 2023-10-07 NOTE — Discharge Instructions (Addendum)
 Your evaluation today did not show any obvious reason for why you are hurting.  However, if there are any new or concerning symptoms, you are welcome to return for further evaluation.

## 2023-10-09 ENCOUNTER — Ambulatory Visit (INDEPENDENT_AMBULATORY_CARE_PROVIDER_SITE_OTHER): Payer: Medicare PPO

## 2023-10-09 DIAGNOSIS — I442 Atrioventricular block, complete: Secondary | ICD-10-CM

## 2023-10-09 LAB — CUP PACEART REMOTE DEVICE CHECK
Battery Voltage: 75
Date Time Interrogation Session: 20250509072126
Implantable Lead Connection Status: 753985
Implantable Lead Connection Status: 753985
Implantable Lead Implant Date: 20211227
Implantable Lead Implant Date: 20211227
Implantable Lead Location: 753859
Implantable Lead Location: 753860
Implantable Lead Model: 377
Implantable Lead Model: 377
Implantable Lead Serial Number: 8000018960
Implantable Lead Serial Number: 8000042643
Implantable Pulse Generator Implant Date: 20211227
Pulse Gen Model: 407145
Pulse Gen Serial Number: 69915927

## 2023-10-12 ENCOUNTER — Encounter: Payer: Self-pay | Admitting: Internal Medicine

## 2023-10-13 ENCOUNTER — Encounter (HOSPITAL_COMMUNITY): Payer: Self-pay

## 2023-10-13 ENCOUNTER — Emergency Department (HOSPITAL_COMMUNITY)
Admission: EM | Admit: 2023-10-13 | Discharge: 2023-10-14 | Disposition: A | Discharge status: Home / Self Care | Attending: Emergency Medicine | Admitting: Emergency Medicine

## 2023-10-13 ENCOUNTER — Emergency Department (HOSPITAL_COMMUNITY)

## 2023-10-13 ENCOUNTER — Other Ambulatory Visit: Payer: Self-pay

## 2023-10-13 DIAGNOSIS — I10 Essential (primary) hypertension: Secondary | ICD-10-CM | POA: Diagnosis not present

## 2023-10-13 DIAGNOSIS — R4689 Other symptoms and signs involving appearance and behavior: Secondary | ICD-10-CM | POA: Diagnosis present

## 2023-10-13 DIAGNOSIS — Z79899 Other long term (current) drug therapy: Secondary | ICD-10-CM | POA: Insufficient documentation

## 2023-10-13 DIAGNOSIS — Z8673 Personal history of transient ischemic attack (TIA), and cerebral infarction without residual deficits: Secondary | ICD-10-CM | POA: Diagnosis not present

## 2023-10-13 DIAGNOSIS — R462 Strange and inexplicable behavior: Secondary | ICD-10-CM | POA: Diagnosis not present

## 2023-10-13 DIAGNOSIS — Z7982 Long term (current) use of aspirin: Secondary | ICD-10-CM | POA: Diagnosis not present

## 2023-10-13 DIAGNOSIS — F0390 Unspecified dementia without behavioral disturbance: Secondary | ICD-10-CM | POA: Insufficient documentation

## 2023-10-13 LAB — CBC WITH DIFFERENTIAL/PLATELET
Abs Immature Granulocytes: 0.03 10*3/uL (ref 0.00–0.07)
Basophils Absolute: 0 10*3/uL (ref 0.0–0.1)
Basophils Relative: 0 %
Eosinophils Absolute: 0.1 10*3/uL (ref 0.0–0.5)
Eosinophils Relative: 2 %
HCT: 49.6 % (ref 39.0–52.0)
Hemoglobin: 17 g/dL (ref 13.0–17.0)
Immature Granulocytes: 0 %
Lymphocytes Relative: 22 %
Lymphs Abs: 1.6 10*3/uL (ref 0.7–4.0)
MCH: 32.3 pg (ref 26.0–34.0)
MCHC: 34.3 g/dL (ref 30.0–36.0)
MCV: 94.1 fL (ref 80.0–100.0)
Monocytes Absolute: 0.4 10*3/uL (ref 0.1–1.0)
Monocytes Relative: 6 %
Neutro Abs: 5.2 10*3/uL (ref 1.7–7.7)
Neutrophils Relative %: 70 %
Platelets: 150 10*3/uL (ref 150–400)
RBC: 5.27 MIL/uL (ref 4.22–5.81)
RDW: 14.5 % (ref 11.5–15.5)
WBC: 7.4 10*3/uL (ref 4.0–10.5)
nRBC: 0 % (ref 0.0–0.2)

## 2023-10-13 LAB — COMPREHENSIVE METABOLIC PANEL WITH GFR
ALT: 25 U/L (ref 0–44)
AST: 29 U/L (ref 15–41)
Albumin: 4 g/dL (ref 3.5–5.0)
Alkaline Phosphatase: 53 U/L (ref 38–126)
Anion gap: 10 (ref 5–15)
BUN: 18 mg/dL (ref 8–23)
CO2: 24 mmol/L (ref 22–32)
Calcium: 9.8 mg/dL (ref 8.9–10.3)
Chloride: 105 mmol/L (ref 98–111)
Creatinine, Ser: 0.98 mg/dL (ref 0.61–1.24)
GFR, Estimated: >60 mL/min (ref >60)
Glucose, Bld: 98 mg/dL (ref 70–99)
Potassium: 3.6 mmol/L (ref 3.5–5.1)
Sodium: 139 mmol/L (ref 135–145)
Total Bilirubin: 0.8 mg/dL (ref 0.0–1.2)
Total Protein: 7.7 g/dL (ref 6.5–8.1)

## 2023-10-13 NOTE — ED Triage Notes (Signed)
 Pt BIB [facility] from home. Per family, pt fell asleep in his chair today and started leaning to the side. He then caught himself and jerked awake. No current complaints. H/x dementia. A&Ox2 at baseline. Family also wants pt checked for a UTI. No urinary sxs reported.

## 2023-10-14 LAB — URINALYSIS, W/ REFLEX TO CULTURE (INFECTION SUSPECTED)
Bacteria, UA: NONE SEEN
Bilirubin Urine: NEGATIVE
Glucose, UA: NEGATIVE mg/dL
Hgb urine dipstick: NEGATIVE
Ketones, ur: NEGATIVE mg/dL
Nitrite: NEGATIVE
Protein, ur: 30 mg/dL — AB
Specific Gravity, Urine: 1.005 (ref 1.005–1.030)
pH: 6 (ref 5.0–8.0)

## 2023-10-14 NOTE — ED Provider Notes (Addendum)
 Dustin Hamilton EMERGENCY DEPARTMENT AT [facility] Provider Note   CSN: [id number] Arrival date & time: 10/13/23  2008     History  Chief Complaint  Patient presents with   Behavior Concerns    Dustin Hamilton is a 83 y.o. male.  Patient is an 83 year old male with a history of prior CVA, hypertension, dementia, recent hospitalizations for syncope status post pacemaker and his wife reports he has been ruled out for seizures who is presenting today after an episode at home.  His wife reports now for the last few weeks the patient has just not been himself.  He has been very restless and has not been sleeping very well.  He often reports that he does not remember things and that makes him scared.  He was recently hospitalized at the end of April and at that time there was no evidence of dysrhythmia but there was concern that he was dehydrated with some abnormal electrolytes due to poor oral intake.  His wife reports since being home he has been eating just not as much as he used to.  He has been compliant with his medications and has not had any falls.  She reports that he does occasionally cough but has not had cold-like symptoms.  He today was sitting in his chair when it seem like he was unresponsive for a short amount of time and then jerked awake and grabbed his head stating it was hurting while his wife was at a meeting and other family members were at the house.  Patient at this time has no complaints.  He states he is having no pain at all and denies any complaints at this time.  The history is provided by the patient, the spouse and medical records.       Home Medications Prior to Admission medications   Medication Sig Start Date End Date Taking? Authorizing Provider  allopurinol  (ZYLOPRIM ) 100 MG tablet Take 1 tablet (100 mg total) by mouth daily. 08/07/23 08/06/24  Krishnan, Gokul, MD  amLODipine  (NORVASC ) 10 MG tablet Take 5 mg by mouth daily.    [provider]   aspirin  81 MG chewable tablet Chew 1 tablet (81 mg total) by mouth daily. 03/22/23   Elgergawy, Ardia Kraft, MD  brimonidine  (ALPHAGAN ) 0.2 % ophthalmic solution Place 1 drop into both eyes 2 (two) times daily. 10/19/22   Sridharan, Sriramkumar, MD  colchicine  0.6 MG tablet Take 1 tablet (0.6 mg total) by mouth daily. 10/02/23   Ghimire, Estil Heman, MD  dorzolamide  (TRUSOPT ) 2 % ophthalmic solution Place 1 drop into both eyes 2 (two) times daily.    [provider]  dutasteride  (AVODART ) 0.5 MG capsule Take 0.5 mg by mouth in the morning.    [provider]  latanoprost  (XALATAN ) 0.005 % ophthalmic solution Place 1 drop into both eyes at bedtime.    [provider]  melatonin 3 MG TABS tablet Take 3 mg by mouth at bedtime.    [provider]  memantine  (NAMENDA ) 10 MG tablet Take 10 mg by mouth 2 (two) times daily.    [provider]  tamsulosin  (FLOMAX ) 0.4 MG CAPS capsule Take 0.4 mg by mouth at bedtime.    [provider]      Allergies    Penicillins, Levetiracetam , Aricept  [donepezil ], Exelon [rivastigmine], and Viagra [sildenafil]    Review of Systems   Review of Systems  Physical Exam Updated Vital Signs BP (!) 168/73   Pulse 80  Temp 97.8 F (36.6 C)   Resp 18   Ht 5\' 11"  (1.803 m)   Wt 71.7 kg   SpO2 99%   BMI 22.04 kg/m  Physical Exam Vitals and nursing note reviewed.  Constitutional:      General: He is not in acute distress.    Appearance: He is well-developed.  HENT:     Head: Normocephalic and atraumatic.  Eyes:     Conjunctiva/sclera: Conjunctivae normal.     Pupils: Pupils are equal, round, and reactive to light.  Cardiovascular:     Rate and Rhythm: Normal rate and regular rhythm.     Heart sounds: No murmur heard. Pulmonary:     Effort: Pulmonary effort is normal. No respiratory distress.     Breath sounds: Normal breath sounds. No wheezing or rales.  Abdominal:     General: There is no distension.      Palpations: Abdomen is soft.     Tenderness: There is no abdominal tenderness. There is no guarding or rebound.  Musculoskeletal:        General: No tenderness. Normal range of motion.     Cervical back: Normal range of motion and neck supple.  Skin:    General: Skin is warm and dry.     Findings: No erythema or rash.  Neurological:     Mental Status: He is alert and oriented to person, place, and time. Mental status is at baseline.     Cranial Nerves: No dysarthria or facial asymmetry.     Sensory: No sensory deficit.     Motor: No weakness or pronator drift.     Coordination: Coordination normal. Finger-Nose-Finger Test normal.  Psychiatric:        Behavior: Behavior normal.     ED Results / Procedures / Treatments   Labs (all labs ordered are listed, but only abnormal results are displayed) Labs Reviewed  CBC WITH DIFFERENTIAL/PLATELET  COMPREHENSIVE METABOLIC PANEL WITH GFR  URINALYSIS, W/ REFLEX TO CULTURE (INFECTION SUSPECTED)  AMMONIA    EKG None  Radiology CT Head Wo Contrast Result Date: 10/13/2023 CLINICAL DATA:  Altered mental status. EXAM: CT HEAD WITHOUT CONTRAST TECHNIQUE: Contiguous axial images were obtained from the base of the skull through the vertex without intravenous contrast. RADIATION DOSE REDUCTION: This exam was performed according to the departmental dose-optimization program which includes automated exposure control, adjustment of the mA and/or kV according to patient size and/or use of iterative reconstruction technique. COMPARISON:  September 12, 2023 FINDINGS: Brain: There is generalized cerebral atrophy with widening of the extra-axial spaces and stable ventricular dilatation. There are areas of decreased attenuation within the white matter tracts of the supratentorial brain, consistent with microvascular disease changes. A chronic left occipital lobe infarct is noted. Vascular: No hyperdense vessel or unexpected calcification. Skull: Normal. Negative for  fracture or focal lesion. Sinuses/Orbits: No acute finding. Other: None. IMPRESSION: 1. Generalized cerebral atrophy and microvascular disease changes of the supratentorial brain. 2. Chronic left occipital lobe infarct. 3. No acute intracranial abnormality. 4. Mild, stable ventricular prominence which may be, in part, related to atrophic changes. Sequelae associated with normal pressure hydrocephalus cannot completely be excluded. Electronically Signed   By: Virgle Grime M.D.   On: 10/13/2023 22:53   DG Chest 1 View Result Date: 10/13/2023 CLINICAL DATA:  Altered mental status. EXAM: CHEST  1 VIEW COMPARISON:  Oct 06, 2023 FINDINGS: There is stable dual lead AICD positioning. The heart size and mediastinal contours are within normal limits. Low  lung volumes are noted with mild elevation of the right hemidiaphragm. Mild linear atelectasis is seen within the right lung base. No acute infiltrate, pleural effusion or pneumothorax is identified. The visualized skeletal structures are unremarkable. IMPRESSION: No acute cardiopulmonary disease. Electronically Signed   By: Virgle Grime M.D.   On: 10/13/2023 22:51    Procedures Procedures    Medications Ordered in ED Medications - No data to display  ED Course/ Medical Decision Making/ A&P                                 Medical Decision Making Amount and/or Complexity of Data Reviewed Independent Historian: spouse and EMS External Data Reviewed: notes. Labs: ordered. Decision-making details documented in ED Course. Radiology: ordered and independent interpretation performed. Decision-making details documented in ED Course. ECG/medicine tests: ordered and independent interpretation performed. Decision-making details documented in ED Course.   Pt with multiple medical problems and comorbidities and presenting today with a complaint that caries a high risk for morbidity and mortality.  Here today for the above symptoms.  Patient is  well-appearing in the bed.  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  Blood pressure and heart rate at this time are normal.  Will ensure no underlying issue such as electrolyte abnormality, AKI, infection.  However suspect that patient's symptoms may be more related to his worsening dementia.  His wife reports he has already been tested for seizures and that was negative.  He has recently had evaluation and no acute cardiac findings present.  I independently interpreted patient's labs and EKG.  EKG with a type II Mobitz which is unchanged from prior with a normal rate, CBC, CMP are within normal limits.  I have independently visualized and interpreted pt's images today.  Chest x-ray without acute findings today, head CT without acute findings.  Radiology reports chronic left occipital lobe infarct and mild stable ventricular prominence which is unchanged.          Final Clinical Impression(s) / ED Diagnoses Final diagnoses:  None    Rx / DC Orders ED Discharge Orders     None         Almond Army, MD 10/14/23 [id number]    Almond Army, MD 10/14/23 [phone]

## 2023-10-14 NOTE — ED Provider Notes (Signed)
 Care of patient assumed from Dr. Leida Puna.  This patient presents for episode today of falling asleep in the chair.  He does have dementia and reportedly has poor sleep at night.  He is awaiting urinalysis. Physical Exam  BP (!) 176/104 (BP Location: Right Arm)   Pulse 98   Temp 97.6 F (36.4 C)   Resp 16   Ht 5\' 11"  (1.803 m)   Wt 71.7 kg   SpO2 99%   BMI 22.04 kg/m   Physical Exam Vitals and nursing note reviewed.  Constitutional:      General: He is not in acute distress.    Appearance: Normal appearance. He is well-developed. He is not ill-appearing, toxic-appearing or diaphoretic.  HENT:     Head: Normocephalic and atraumatic.     Right Ear: External ear normal.     Left Ear: External ear normal.     Nose: Nose normal.     Mouth/Throat:     Mouth: Mucous membranes are moist.  Eyes:     Extraocular Movements: Extraocular movements intact.     Conjunctiva/sclera: Conjunctivae normal.  Cardiovascular:     Rate and Rhythm: Normal rate and regular rhythm.  Pulmonary:     Effort: Pulmonary effort is normal. No respiratory distress.  Abdominal:     General: There is no distension.     Palpations: Abdomen is soft.  Musculoskeletal:        General: No swelling or deformity.     Cervical back: Normal range of motion and neck supple.  Skin:    General: Skin is warm and dry.     Coloration: Skin is not jaundiced or pale.  Neurological:     General: No focal deficit present.     Mental Status: He is alert.  Psychiatric:        Mood and Affect: Mood normal.        Behavior: Behavior normal.    Procedures  Procedures  ED Course / MDM    Medical Decision Making Amount and/or Complexity of Data Reviewed Labs: ordered. Radiology: ordered.   On assessment, patient resting comfortably.  He has no complaints.  Urinalysis does not show any evidence of infection.  Patient was able to ambulate without difficulty. I spoke with his wife who will come pick him up. Patient was  discharged in stable condition.        Iva Mariner, MD 10/14/23 [phone]

## 2023-10-14 NOTE — Discharge Instructions (Signed)
 Your test results today are reassuring.  Follow-up with your neurologist.  Return to the emergency department for any new or worsening symptoms of concern.

## 2023-10-19 ENCOUNTER — Encounter (HOSPITAL_COMMUNITY): Payer: Self-pay | Admitting: Emergency Medicine

## 2023-10-19 ENCOUNTER — Emergency Department (HOSPITAL_COMMUNITY)
Admission: EM | Admit: 2023-10-19 | Discharge: 2023-10-20 | Disposition: A | Discharge status: Home / Self Care | Attending: Emergency Medicine | Admitting: Emergency Medicine

## 2023-10-19 DIAGNOSIS — Z950 Presence of cardiac pacemaker: Secondary | ICD-10-CM | POA: Diagnosis not present

## 2023-10-19 DIAGNOSIS — Z8673 Personal history of transient ischemic attack (TIA), and cerebral infarction without residual deficits: Secondary | ICD-10-CM | POA: Insufficient documentation

## 2023-10-19 DIAGNOSIS — I10 Essential (primary) hypertension: Secondary | ICD-10-CM | POA: Diagnosis not present

## 2023-10-19 DIAGNOSIS — Z7982 Long term (current) use of aspirin: Secondary | ICD-10-CM | POA: Diagnosis not present

## 2023-10-19 DIAGNOSIS — F0390 Unspecified dementia without behavioral disturbance: Secondary | ICD-10-CM | POA: Insufficient documentation

## 2023-10-19 DIAGNOSIS — R7989 Other specified abnormal findings of blood chemistry: Secondary | ICD-10-CM | POA: Diagnosis not present

## 2023-10-19 DIAGNOSIS — Z79899 Other long term (current) drug therapy: Secondary | ICD-10-CM | POA: Insufficient documentation

## 2023-10-19 DIAGNOSIS — R4182 Altered mental status, unspecified: Secondary | ICD-10-CM | POA: Diagnosis present

## 2023-10-19 LAB — COMPREHENSIVE METABOLIC PANEL WITH GFR
ALT: 21 U/L (ref 0–44)
AST: 24 U/L (ref 15–41)
Albumin: 3.8 g/dL (ref 3.5–5.0)
Alkaline Phosphatase: 57 U/L (ref 38–126)
Anion gap: 13 (ref 5–15)
BUN: 19 mg/dL (ref 8–23)
CO2: 22 mmol/L (ref 22–32)
Calcium: 10 mg/dL (ref 8.9–10.3)
Chloride: 104 mmol/L (ref 98–111)
Creatinine, Ser: 1.26 mg/dL — ABNORMAL HIGH (ref 0.61–1.24)
GFR, Estimated: 57 mL/min — ABNORMAL LOW (ref >60)
Glucose, Bld: 114 mg/dL — ABNORMAL HIGH (ref 70–99)
Potassium: 4.1 mmol/L (ref 3.5–5.1)
Sodium: 139 mmol/L (ref 135–145)
Total Bilirubin: 1.3 mg/dL — ABNORMAL HIGH (ref 0.0–1.2)
Total Protein: 7.2 g/dL (ref 6.5–8.1)

## 2023-10-19 LAB — CBG MONITORING, ED: Glucose-Capillary: 114 mg/dL — ABNORMAL HIGH (ref 70–99)

## 2023-10-19 LAB — CBC
HCT: 48.7 % (ref 39.0–52.0)
Hemoglobin: 16.8 g/dL (ref 13.0–17.0)
MCH: 32.6 pg (ref 26.0–34.0)
MCHC: 34.5 g/dL (ref 30.0–36.0)
MCV: 94.6 fL (ref 80.0–100.0)
Platelets: 124 10*3/uL — ABNORMAL LOW (ref 150–400)
RBC: 5.15 MIL/uL (ref 4.22–5.81)
RDW: 14.2 % (ref 11.5–15.5)
WBC: 6.7 10*3/uL (ref 4.0–10.5)
nRBC: 0 % (ref 0.0–0.2)

## 2023-10-19 NOTE — ED Triage Notes (Signed)
 Per family, pt states that pt has baseline dementia but he has been having these "spells of tremor/shaking, gets hot, moaning/groaning and starts stops talking" that usually last 10 minutes.  She states this has happened three times today. No evidence of this now.

## 2023-10-20 ENCOUNTER — Emergency Department (HOSPITAL_COMMUNITY)

## 2023-10-20 LAB — URINALYSIS, ROUTINE W REFLEX MICROSCOPIC
Bilirubin Urine: NEGATIVE
Glucose, UA: NEGATIVE mg/dL
Hgb urine dipstick: NEGATIVE
Ketones, ur: 5 mg/dL — AB
Nitrite: NEGATIVE
Protein, ur: 100 mg/dL — AB
Specific Gravity, Urine: 1.018 (ref 1.005–1.030)
pH: 5 (ref 5.0–8.0)

## 2023-10-20 MED ORDER — SODIUM CHLORIDE 0.9 % IV BOLUS
1000.0000 mL | Freq: Once | INTRAVENOUS | Status: AC
  Administered 2023-10-20: 1000 mL via INTRAVENOUS

## 2023-10-20 MED ORDER — NITROFURANTOIN MONOHYD MACRO 100 MG PO CAPS
100.0000 mg | ORAL_CAPSULE | Freq: Once | ORAL | Status: AC
  Administered 2023-10-20: 100 mg via ORAL
  Filled 2023-10-20 (×2): qty 1

## 2023-10-20 MED ORDER — NITROFURANTOIN MONOHYD MACRO 100 MG PO CAPS: 100.0000 mg | ORAL_CAPSULE | Freq: Two times a day (BID) | ORAL | 0 refills | Status: DC

## 2023-10-20 NOTE — ED Provider Notes (Signed)
 Dustin [facility] EMERGENCY DEPARTMENT AT [facility] Provider Note   CSN: [id number] Arrival date & time: 10/19/23  2058     History  Chief Complaint  Patient presents with   Altered Mental Status    Dustin Hamilton is a 83 y.o. male with medical history to include TIA, hypertension, history of alcohol  abuse, dementia, pacemaker, acute metabolic encephalopathy, dementia.  Patient presents to ED for evaluation of altered mental status per his wife.  Per patient wife, the patient has had 3 episodes today where he would shake and complain of pain in his head.  Patient wife reports that these episodes last for between 5 and 10 minutes and then resolved.  She reports last episode was around 7:30 PM tonight while in the waiting room.  She reports that her husband has a home health aide that comes out 4 days a week.  She denies any recent fevers, nausea, vomiting, diarrhea.  She reports that her spouse has adequate p.o. intake and has not had any kind of decreased p.o. intake recently.  Denies any malodorous urine at home.  Patient alert and oriented to baseline.  Denies any pain or concerns.  Reports that he does remember these episodes happening.  Patient wife was seen for similar episodes on 5/14 with unremarkable CT scan and lab work and discharged home.   Altered Mental Status      Home Medications Prior to Admission medications   Medication Sig Start Date End Date Taking? Authorizing Provider  nitrofurantoin, macrocrystal-monohydrate, (MACROBID) 100 MG capsule Take 1 capsule (100 mg total) by mouth 2 (two) times daily. 10/20/23  Yes Adel Aden, PA-C  allopurinol  (ZYLOPRIM ) 100 MG tablet Take 1 tablet (100 mg total) by mouth daily. 08/07/23 08/06/24  Krishnan, Gokul, MD  amLODipine  (NORVASC ) 10 MG tablet Take 5 mg by mouth daily.    [provider]  aspirin  81 MG chewable tablet Chew 1 tablet (81 mg total) by mouth daily. 03/22/23   Elgergawy, Ardia Kraft, MD   brimonidine  (ALPHAGAN ) 0.2 % ophthalmic solution Place 1 drop into both eyes 2 (two) times daily. 10/19/22   Sridharan, Sriramkumar, MD  colchicine  0.6 MG tablet Take 1 tablet (0.6 mg total) by mouth daily. 10/02/23   Ghimire, Estil Heman, MD  dorzolamide  (TRUSOPT ) 2 % ophthalmic solution Place 1 drop into both eyes 2 (two) times daily.    [provider]  dutasteride  (AVODART ) 0.5 MG capsule Take 0.5 mg by mouth in the morning.    [provider]  latanoprost  (XALATAN ) 0.005 % ophthalmic solution Place 1 drop into both eyes at bedtime.    [provider]  melatonin 3 MG TABS tablet Take 3 mg by mouth at bedtime.    [provider]  memantine  (NAMENDA ) 10 MG tablet Take 10 mg by mouth 2 (two) times daily.    [provider]  tamsulosin  (FLOMAX ) 0.4 MG CAPS capsule Take 0.4 mg by mouth at bedtime.    [provider]      Allergies    Penicillins, Levetiracetam , Aricept  [donepezil ], Exelon [rivastigmine], and Viagra [sildenafil]    Review of Systems   Review of Systems  Unable to perform ROS: Dementia (Level 5 caveat)    Physical Exam Updated Vital Signs BP (!) 128/95 (BP Location: Left Arm)   Pulse 93   Temp 97.9 F (36.6 C)   Resp 14   SpO2 99%  Physical Exam Vitals and nursing note reviewed.  Constitutional:  General: He is not in acute distress.    Appearance: He is well-developed.  HENT:     Head: Normocephalic and atraumatic.  Eyes:     Conjunctiva/sclera: Conjunctivae normal.  Cardiovascular:     Rate and Rhythm: Normal rate and regular rhythm.     Heart sounds: No murmur heard. Pulmonary:     Effort: Pulmonary effort is normal. No respiratory distress.     Breath sounds: Normal breath sounds.  Abdominal:     Palpations: Abdomen is soft.     Tenderness: There is no abdominal tenderness.  Musculoskeletal:        General: No swelling.     Cervical back: Neck supple.  Skin:    General: Skin is warm and dry.      Capillary Refill: Capillary refill takes less than 2 seconds.  Neurological:     General: No focal deficit present.     Mental Status: He is alert. Mental status is at baseline.     GCS: GCS eye subscore is 4. GCS verbal subscore is 5. GCS motor subscore is 6.     Cranial Nerves: Cranial nerves 2-12 are intact. No cranial nerve deficit.     Sensory: Sensation is intact. No sensory deficit.     Motor: Motor function is intact. No weakness.     Comments: CN III through XII intact.  Intact finger-nose, heel-to-shin.  No pronator drift, no slurred speech, no facial droop.  Equal grip strength upper extremities bilaterally.  Equal strength bilateral lower extremities.  Pupils PERRL.  Patient tracks across the midline.  Alert and oriented to baseline.  Psychiatric:        Mood and Affect: Mood normal.     ED Results / Procedures / Treatments   Labs (all labs ordered are listed, but only abnormal results are displayed) Labs Reviewed  COMPREHENSIVE METABOLIC PANEL WITH GFR - Abnormal; Notable for the following components:      Result Value   Glucose, Bld 114 (*)    Creatinine, Ser 1.26 (*)    Total Bilirubin 1.3 (*)    GFR, Estimated 57 (*)    All other components within normal limits  CBC - Abnormal; Notable for the following components:   Platelets 124 (*)    All other components within normal limits  URINALYSIS, ROUTINE W REFLEX MICROSCOPIC - Abnormal; Notable for the following components:   APPearance HAZY (*)    Ketones, ur 5 (*)    Protein, ur 100 (*)    Leukocytes,Ua LARGE (*)    Bacteria, UA RARE (*)    All other components within normal limits  CBG MONITORING, ED - Abnormal; Notable for the following components:   Glucose-Capillary 114 (*)    All other components within normal limits  URINE CULTURE    EKG None  Radiology CT Head Wo Contrast Result Date: 10/20/2023 CLINICAL DATA:  Mental status change, unknown cause EXAM: CT HEAD WITHOUT CONTRAST TECHNIQUE: Contiguous  axial images were obtained from the base of the skull through the vertex without intravenous contrast. RADIATION DOSE REDUCTION: This exam was performed according to the departmental dose-optimization program which includes automated exposure control, adjustment of the mA and/or kV according to patient size and/or use of iterative reconstruction technique. COMPARISON:  CT head 10/13/2023 FINDINGS: Brain: Stable prominence of the lateral ventricles may be related to central predominant atrophy, although a component of normal pressure/communicating hydrocephalus cannot be excluded. Patchy and confluent areas of decreased attenuation are noted throughout the deep  and periventricular white matter of the cerebral hemispheres bilaterally, compatible with chronic microvascular ischemic disease. Chronic left occipital infarction. No evidence of large-territorial acute infarction. No parenchymal hemorrhage. No mass lesion. No extra-axial collection. No mass effect or midline shift. No hydrocephalus. Basilar cisterns are patent. Vascular: No hyperdense vessel. Atherosclerotic calcifications are present within the cavernous internal carotid and vertebral arteries. Skull: No acute fracture or focal lesion. Sinuses/Orbits: Paranasal sinuses and mastoid air cells are clear. Bilateral lens replacement. Otherwise the orbits are unremarkable. Other: None. IMPRESSION: 1. No acute intracranial abnormality. 2. Stable prominence of the lateral ventricles may be related to central predominant atrophy, although a component of normal pressure/communicating hydrocephalus cannot be excluded. Electronically Signed   By: Morgane  Naveau M.D.   On: 10/20/2023 03:05    Procedures Procedures    Medications Ordered in ED Medications  nitrofurantoin (macrocrystal-monohydrate) (MACROBID) capsule 100 mg (has no administration in time range)  sodium chloride  0.9 % bolus 1,000 mL (1,000 mLs Intravenous New Bag/Given 10/20/23 0221)    ED  Course/ Medical Decision Making/ A&P  Medical Decision Making Amount and/or Complexity of Data Reviewed Labs: ordered. Radiology: ordered.  Risk Prescription drug management.   83 year old male presents for evaluation.  Please see HPI for further details.  On examination the patient is afebrile, nontachycardic.  Lung sounds are clear bilaterally, he is nonhypoxic.  Abdomen soft and compressible.  Neurological examination at baseline without focal neurodeficits.  Patient lab initiated in triage include CBC, CMP, urinalysis, CBG.  Patient CBC without leukocytosis or anemia.  CMP with slightly elevated creatinine to 1.26.  7 days ago 0.98.  Patient able to tolerate p.o. fluids here in the department.  Provided with 1 L of fluid.  Urinalysis shows protein, leukocytes.  Will culture patient urine.  Will start patient on Macrobid to cover for infection.  CT scan of head unremarkable.  Does show stable prominence of the lateral ventricles which may be related to central predominant atrophy, although a component of normal pressure/communicating hydrocephalus cannot be excluded.  The patient has no headache, blurred vision.  He ambulates with a steady gait.  These findings were noted on CT scan collected on 5/14.  Patient wife was advised to have patient seen by neurology in the next 1 week to discuss these findings.  At this time, no indication for admission at this time.  Patient started on Macrobid for possible UTI, first dose given here in the department.  Will culture patient urine.  Patient also advised to follow-up with neurology team, his wife at the bedside was also advised of this and she voiced understanding.    Patient discharged home at this time in stable condition.   Final Clinical Impression(s) / ED Diagnoses Final diagnoses:  Dementia, unspecified dementia severity, unspecified dementia type, unspecified whether behavioral, psychotic, or mood disturbance or anxiety (HCC)    Rx  / DC Orders ED Discharge Orders          Ordered    nitrofurantoin, macrocrystal-monohydrate, (MACROBID) 100 MG capsule  2 times daily        10/20/23 0329              Gina Leblond F, PA-C 10/20/23 0341    Ballard Bongo, MD 10/21/23 [phone]

## 2023-10-20 NOTE — Discharge Instructions (Addendum)
 It was a pleasure taking part in your care.  As discussed, please begin taking Macrobid twice a day for 7 days for UTI.  Please follow-up with the neurology team for further management and care.  Please return to the ED with any new or worsening symptoms.

## 2023-10-21 LAB — URINE CULTURE: Culture: NO GROWTH

## 2023-10-27 NOTE — Progress Notes (Unsigned)
 Cardiology Office Note    Patient Name: Dustin Hamilton Date of Encounter: 10/27/2023  Primary Care Provider:  [facility] Primary Cardiologist:  Lauro Portal, MD Primary Electrophysiologist: Manya Sells, MD   Past Medical History    Past Medical History:  Diagnosis Date   BPH (benign prostatic hyperplasia)    Cerebrovascular disease    Clostridium difficile diarrhea    Congenital anomaly of diaphragm    Elevated PSA    Glaucoma, both eyes    Hemorrhoid    Hepatitis B surface antigen positive    02-20-2011   History of adenomatous polyp of colon    2007, 2009 and 2013  tubular adenoma's   History of alcohol  abuse    quit 1963   History of cerebral parenchymal hemorrhage    01/ 2006  left occiptial lobe related to hypertensive crisis   History of CVA (cerebrovascular accident)    09-12-2012  left hippocampus/ amygdala junction and per MRI old white matter infarcts--  per pt residual short- term memory issues   History of fatty infiltration of liver hx visit's at 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  Dustin Hamilton has been followed by Dr. Katheryne Pane since 2019 for management of syncope and hypertension.  He underwent a Myoview  that was normal on 06/12/2017.  He was admitted on 10/14/2018 with complaint of syncope.  He was previously seen in the ED in 2018 with PSVT and hypotension.  He had a TTE completed on  06/2017 that showed normal EF with no significant valve disease.  He was referred back to Dr. Carolynne Citron for evaluation of symptomatic SVT.  He underwent ablation of AVNRT on 11/2018 that was successful.  He was seen in the ED on 01/22/2020 with complaint of chest pain and dizziness with negative workup.  He had carvedilol  switched to metoprolol  and amlodipine  was decreased.  He wore an event monitor that showed sinus rhythm with sinus bradycardia and occasional PACs and PVCs with second-degree AV block and 3.2-second pauses.  He was seen in follow-up on 04/13/2020 patient endorsed ongoing episodic dizziness.  He was previously evaluated by neurology and was advised to follow-up with neurology again.   He was also referred to EP and was evaluated by Dr. Marven Slimmer on 05/22/2020 with the patient suffering a syncopal episode in the waiting room and heart rate showing an atrial tach in the 130s.  He was transferred to the ED and underwent Biotronik PPM placement on 05/28/2020 and was also diagnosed with epilepsy.  He was seen by Dr. Dean Every on 07/24/2020 for continued symptomatic hypotension and amlodipine  was discontinued.  He was evaluated by our clinical pharmacist and advised to follow-up with his urologist regarding discontinuation of Flomax .  He was seen in the ED at [facility] on  06/2018 for complaint of weakness and hypotension and possible atrial fibrillation by EKG.  PPM interrogation shows 0% AF burden.  He was seen in the ED on 09/30/2023 episode of syncope.  His wife slumped over his walker and unresponsive in the bathroom.  EMS was called and patient's heart rate was in the 20s.  He was admitted and evaluated by Dr. Audery Blazing with Biotronik PPM interrogated showing V paced 93% episodic arrhythmias or cause of syncope.  There was suspicion of possible orthostatic hypotension in the setting of poor p.o. intake.  2D echo was completed on 08/07/2023 that showed EF of 50-55% with no RWMA and mild LVH with trivial MVR.  He was  seen in the ED on 10/06/2023 with complaint of arm and chest pain.  He had troponins completed that were mildly elevated chest x-ray showed no acute cardiopulmonary disease.  Urinalysis also completed that showed no evidence of UTI.  He was discharged in stable condition and seen most recently on 10/20/2023 with altered mental status.  He underwent a CT of the head that was unremarkable and patient was found to have UTI and started on Macrobid with advisement to follow-up with neurology.  Dustin Hamilton presents today with his wife for posthospital follow-up.  He reports since his previous ED visit that he is doing better with no episodes of dizziness or fainting. He is currently off tamsulosin  due to dizziness but remains on Avodart , testosterone, and amlodipine  5 mg daily. His home blood pressure is 130-140/80-90. No recent dizziness or fatigue. Metoprolol  was discontinued due to dizziness.  I rechecked his blood pressure which had improved to 128/82.  We completed orthostatic blood pressures which were negative.  His wife reports that he is compliant with his medication regimen.  He is scheduled to follow-up with neurology in the next few weeks and was evaluated by urology yesterday with no changes made to therapy. He experiences restlessness at night, possibly related to dementia, and uses melatonin with limited effect. His wife notes he becomes 'fidgety' after meals. He drinks water and sugar-free Gatorade. Patient denies chest pain, palpitations, dyspnea, PND, orthopnea, nausea, vomiting, dizziness, syncope, edema, weight gain, or early satiety.  Discussed the use of AI scribe software for clinical note transcription with the patient, who gave verbal consent to proceed.  History of Present Illness   Review of Systems  Please see the history of present illness.    All other systems reviewed and are otherwise negative except as noted above.  Physical Exam     Wt Readings from Last 3 Encounters:   10/13/23 158 lb (71.7 kg)  09/30/23 158 lb (71.7 kg)  09/12/23 158 lb 15.2 oz (72.1 kg)   JX:BJYNW were no vitals filed for this visit.,There is no height or weight on file to calculate BMI. GEN: Well nourished, well developed in no acute distress Neck: No JVD; No carotid bruits Pulmonary: Clear to auscultation without rales, wheezing or rhonchi  Cardiovascular: Normal rate. Regular rhythm. Normal S1. Normal S2.   Murmurs: There is no murmur.  ABDOMEN: Soft, non-tender, non-distended EXTREMITIES:  No edema; No deformity   EKG/LABS/ Recent Cardiac Studies   ECG personally reviewed by me today -none completed today  Risk Assessment/Calculations:          Lab Results  Component Value Date   WBC 6.7 10/19/2023   HGB 16.8 10/19/2023   HCT 48.7 10/19/2023   MCV 94.6 10/19/2023   PLT 124 (L) 10/19/2023   Lab Results  Component  Value Date   CREATININE 1.26 (H) 10/19/2023   BUN 19 10/19/2023   NA 139 10/19/2023   K 4.1 10/19/2023   CL 104 10/19/2023   CO2 22 10/19/2023   Lab Results  Component Value Date   CHOL 108 03/18/2023   HDL 30 (L) 03/18/2023   LDLCALC 67 03/18/2023   TRIG 55 03/18/2023   CHOLHDL 3.6 03/18/2023    Lab Results  Component Value Date   HGBA1C 5.5 08/07/2023   Assessment & Plan    Assessment & Plan  1.  Syncope and collapse: -Recent syncope likely due to dehydration, no abnormal rhythms detected. - Encourage increased fluid intake, especially water and sugar-free Gatorade.  2.  History of CHB: -s/p Biotronik PPM 05/2020 - Most recent Paceart report showing normal lead function and battery with no arrhythmias present  3.  History of AVNRT: -s/p ablation by Dr. Carolynne Citron on 11/2018 with no report of tachycardia or arrhythmias per Paceart report  4.  Essential hypertension:  -Orthostatic VS for the past 24 hrs (Last 3 readings):  BP- Lying Pulse- Lying BP- Sitting Pulse- Sitting BP- Standing at 0 minutes Pulse- Standing at 0 minutes BP-  Standing at 3 minutes Pulse- Standing at 3 minutes  10/28/23 1020 (!) 155/93 67 148/90 74 137/90 84 138/83 94    -Blood pressure today was initially elevated at 140/90 and was 128/82 on recheck. -Orthostatics were checked and were noted to be negative. Patient will monitor blood pressure at home and report if systolic exceeds 259 or diastolic exceeds 563. - Consider increasing amlodipine  to 7.5 mg if blood pressure becomes uncontrolled. - Provide blood pressure log for home monitoring.  Disposition: Follow-up with Lauro Portal, MD or APP in 6 months    Signed, Francene Ing, Retha Cast, NP 10/27/2023, 12:10 PM [facility]

## 2023-10-28 ENCOUNTER — Encounter: Payer: Self-pay | Admitting: Nurse Practitioner

## 2023-10-28 ENCOUNTER — Ambulatory Visit: Attending: Nurse Practitioner | Admitting: Nurse Practitioner

## 2023-10-28 VITALS — BP 128/82 | HR 74 | Ht 71.0 in | Wt 151.6 lb

## 2023-10-28 DIAGNOSIS — I471 Supraventricular tachycardia, unspecified: Secondary | ICD-10-CM

## 2023-10-28 DIAGNOSIS — I10 Essential (primary) hypertension: Secondary | ICD-10-CM | POA: Diagnosis not present

## 2023-10-28 DIAGNOSIS — R55 Syncope and collapse: Secondary | ICD-10-CM | POA: Diagnosis not present

## 2023-10-28 DIAGNOSIS — I442 Atrioventricular block, complete: Secondary | ICD-10-CM

## 2023-10-28 NOTE — Patient Instructions (Signed)
 Medication Instructions:  Your physician recommends that you continue on your current medications as directed. Please refer to the Current Medication list given to you today.  *If you need a refill on your cardiac medications before your next appointment, please call your pharmacy*  Lab Work: None ordered If you have labs (blood work) drawn today and your tests are completely normal, you will receive your results only by: MyChart Message (if you have MyChart) OR A paper copy in the mail If you have any lab test that is abnormal or we need to change your treatment, we will call you to review the results.  Testing/Procedures: None ordered  Follow-Up: At [facility] you and your health needs are our priority.  As part of our continuing mission to provide you with exceptional heart care, our providers are all part of one team.  This team includes your primary Cardiologist (physician) and Advanced Practice Providers or APPs (Physician Assistants and Nurse Practitioners) who all work together to provide you with the care you need, when you need it.  Your next appointment:   6 month(s)  Provider:   Lauro Portal, MD    We recommend signing up for the patient portal called "MyChart".  Sign up information is provided on this After Visit Summary.  MyChart is used to connect with patients for Virtual Visits (Telemedicine).  Patients are able to view lab/test results, encounter notes, upcoming appointments, etc.  Non-urgent messages can be sent to your provider as well.   To learn more about what you can do with MyChart, go to [email or web address].   Other Instructions Check your blood pressure daily for 2 weeks, then contact the office with your readings.  Contact the office either by phone or MyChart with your readings.  Make sure to check your blood pressure 2 hours after taking your medications.   AVOID these things for 30 minutes before checking your blood pressure: No  Drinking caffeine. No Drinking alcohol . No Eating. No Smoking. No Exercising.  Five minutes before checking your blood pressure: Pee. Sit in a dining chair. Avoid sitting in a soft couch or armchair. Be quiet. Do not talk.

## 2023-11-10 ENCOUNTER — Other Ambulatory Visit: Payer: Self-pay

## 2023-11-10 ENCOUNTER — Emergency Department (HOSPITAL_COMMUNITY)

## 2023-11-10 ENCOUNTER — Emergency Department (HOSPITAL_COMMUNITY)
Admission: EM | Admit: 2023-11-10 | Discharge: 2023-11-11 | Disposition: A | Discharge status: Home / Self Care | Attending: Emergency Medicine | Admitting: Emergency Medicine

## 2023-11-10 DIAGNOSIS — F0390 Unspecified dementia without behavioral disturbance: Secondary | ICD-10-CM | POA: Insufficient documentation

## 2023-11-10 DIAGNOSIS — R9389 Abnormal findings on diagnostic imaging of other specified body structures: Secondary | ICD-10-CM | POA: Diagnosis not present

## 2023-11-10 DIAGNOSIS — R079 Chest pain, unspecified: Secondary | ICD-10-CM | POA: Diagnosis not present

## 2023-11-10 DIAGNOSIS — Z7982 Long term (current) use of aspirin: Secondary | ICD-10-CM | POA: Insufficient documentation

## 2023-11-10 LAB — BASIC METABOLIC PANEL WITH GFR
Anion gap: 5 (ref 5–15)
BUN: 19 mg/dL (ref 8–23)
CO2: 27 mmol/L (ref 22–32)
Calcium: 9.8 mg/dL (ref 8.9–10.3)
Chloride: 109 mmol/L (ref 98–111)
Creatinine, Ser: 1.17 mg/dL (ref 0.61–1.24)
GFR, Estimated: >60 mL/min (ref >60)
Glucose, Bld: 108 mg/dL — ABNORMAL HIGH (ref 70–99)
Potassium: 5 mmol/L (ref 3.5–5.1)
Sodium: 141 mmol/L (ref 135–145)

## 2023-11-10 LAB — CBC
HCT: 46 % (ref 39.0–52.0)
Hemoglobin: 15.3 g/dL (ref 13.0–17.0)
MCH: 31.7 pg (ref 26.0–34.0)
MCHC: 33.3 g/dL (ref 30.0–36.0)
MCV: 95.4 fL (ref 80.0–100.0)
Platelets: 148 10*3/uL — ABNORMAL LOW (ref 150–400)
RBC: 4.82 MIL/uL (ref 4.22–5.81)
RDW: 14.3 % (ref 11.5–15.5)
WBC: 5.5 10*3/uL (ref 4.0–10.5)
nRBC: 0 % (ref 0.0–0.2)

## 2023-11-10 NOTE — ED Triage Notes (Signed)
 Pt BIB [facility] form home (wife will come get when d/cd) for CP that lasted 5 minutes - has Visual merchandiser.     Pt had 324 ASA PO with EMS  BP 172/91 64 HR 98% O2

## 2023-11-10 NOTE — ED Provider Notes (Incomplete)
 [place] [facility]MENT AT [facility] Provider Note   CSN: [id number] Arrival date & time: 11/10/23  2240     History  Chief Complaint  Patient presents with  . Chest Pain    ELMAN DETTMAN is a 83 y.o. male with medical history of CVA, alcohol  abuse, hemorrhoids, elevated PSA, history of mixed drug abuse, dementia, atypical chest pain, SVT, heart block.  Patient presents to the ED for evaluation of chest pain.  Per patient, he was at home tonight about 1 hour prior to arrival.  Reports that an episode of chest pain located centrally in his chest that did not radiate, not associated with shortness of breath.  Reports this chest pain lasted for 10 minutes then resolved.  He states that during this episode he was also lightheaded but denies any syncope.  He denies any nausea, vomiting, abdominal pain, back pain, shortness of breath that occurred.  Reports this lasted for 10 minutes and resolved.  History of dementia, alert and oriented to baseline.  Denies leg swelling.  Spoke with the patient wife for collateral information.  Per patient wife, the patient was getting ready for bed this evening when he grabbed his chest and began to yell "my heart my heart".  Patient wife reports she called 911 who advised her to give her husband 3 aspirins which she did.  She reports that when she gave him the aspirin , he reported his chest pain resolved.  She denies that he complained of shortness of breath during this event.  No syncope occurred.   Chest Pain      Home Medications Prior to Admission medications   Medication Sig Start Date End Date Taking? Authorizing Provider  allopurinol  (ZYLOPRIM ) 100 MG tablet Take 1 tablet (100 mg total) by mouth daily. 08/07/23 08/06/24  Krishnan, Gokul, MD  amLODipine  (NORVASC ) 10 MG tablet Take 5 mg by mouth daily.    [provider]  aspirin  81 MG chewable tablet Chew 1 tablet (81 mg total) by mouth daily. 03/22/23   Elgergawy,  Ardia Kraft, MD  brimonidine  (ALPHAGAN ) 0.2 % ophthalmic solution Place 1 drop into both eyes 2 (two) times daily. 10/19/22   Sridharan, Sriramkumar, MD  colchicine  0.6 MG tablet Take 1 tablet (0.6 mg total) by mouth daily. 10/02/23   Ghimire, Estil Heman, MD  dorzolamide  (TRUSOPT ) 2 % ophthalmic solution Place 1 drop into both eyes 2 (two) times daily.    [provider]  dutasteride  (AVODART ) 0.5 MG capsule Take 0.5 mg by mouth in the morning.    [provider]  latanoprost  (XALATAN ) 0.005 % ophthalmic solution Place 1 drop into both eyes at bedtime.    [provider]  melatonin 3 MG TABS tablet Take 3 mg by mouth at bedtime.    [provider]  memantine  (NAMENDA ) 10 MG tablet Take 10 mg by mouth 2 (two) times daily.    [provider]      Allergies    Penicillins, Levetiracetam , Aricept  [donepezil ], Exelon [rivastigmine], and Viagra [sildenafil]    Review of Systems   Review of Systems  Cardiovascular:  Positive for chest pain.    Physical Exam Updated Vital Signs Ht 5\' 11"  (1.803 m)   Wt 68.8 kg   BMI 21.14 kg/m  Physical Exam  ED Results / Procedures / Treatments   Labs (all labs ordered are listed, but only abnormal results are displayed) Labs Reviewed  CBC  BASIC METABOLIC PANEL WITH GFR  TROPONIN I (  HIGH SENSITIVITY)    EKG EKG Interpretation Date/Time:  Tuesday November 10 2023 22:45:14 EDT Ventricular Rate:  60 PR Interval:  169 QRS Duration:  153 QT Interval:  440 QTC Calculation: 440 R Axis:   270  Text Interpretation: atrial and ventricular pacing Confirmed by Mozell Arias [phone]) on 11/10/2023 11:10:39 PM  Radiology No results found.  Procedures Procedures  {Document cardiac monitor, telemetry assessment procedure when appropriate:1}  Medications Ordered in ED Medications - No data to display  ED Course/ Medical Decision Making/ A&P   {   Click here for ABCD2, HEART and other calculatorsREFRESH Note  before signing :1}                              Medical Decision Making Amount and/or Complexity of Data Reviewed Labs: ordered. Radiology: ordered.   ***  {Document critical care time when appropriate:1} {Document review of labs and clinical decision tools ie heart score, Chads2Vasc2 etc:1}  {Document your independent review of radiology images, and any outside records:1} {Document your discussion with family members, caretakers, and with consultants:1} {Document social determinants of health affecting pt's care:1} {Document your decision making why or why not admission, treatments were needed:1} Final Clinical Impression(s) / ED Diagnoses Final diagnoses:  None    Rx / DC Orders ED Discharge Orders     None

## 2023-11-10 NOTE — ED Provider Notes (Signed)
 [place] [facility] AT [facility] Provider Note   CSN: [id number] Arrival date & time: 11/10/23  2240     History  Chief Complaint  Patient presents with   Chest Pain    CHRISEAN KLOTH is a 83 y.o. male with medical history of CVA, alcohol  abuse, hemorrhoids, elevated PSA, history of mixed drug abuse, dementia, atypical chest pain, SVT, heart block.  Patient presents to the ED for evaluation of chest pain.  Per patient, he was at home tonight about 1 hour prior to arrival.  Reports that an episode of chest pain located centrally in his chest that did not radiate, not associated with shortness of breath.  Reports this chest pain lasted for 10 minutes then resolved.  He states that during this episode he was also lightheaded but denies any syncope.  He denies any nausea, vomiting, abdominal pain, back pain, shortness of breath that occurred.  Reports this lasted for 10 minutes and resolved.  History of dementia, alert and oriented to baseline.  Denies leg swelling.  Spoke with the patient wife for collateral information.  Per patient wife, the patient was getting ready for bed this evening when he grabbed his chest and began to yell my heart my heart.  Patient wife reports she called 911 who advised her to give her husband 3 aspirins which she did.  She reports that when she gave him the aspirin , he reported his chest pain resolved.  She denies that he complained of shortness of breath during this event.  No syncope occurred.   Chest Pain      Home Medications Prior to Admission medications   Medication Sig Start Date End Date Taking? Authorizing Provider  allopurinol  (ZYLOPRIM ) 100 MG tablet Take 1 tablet (100 mg total) by mouth daily. 08/07/23 08/06/24  Krishnan, Gokul, MD  amLODipine  (NORVASC ) 10 MG tablet Take 5 mg by mouth daily.    [provider]  aspirin  81 MG chewable tablet Chew 1 tablet (81 mg total) by mouth daily. 03/22/23   Elgergawy, Ardia Kraft, MD  brimonidine  (ALPHAGAN ) 0.2 % ophthalmic solution Place 1 drop into both eyes 2 (two) times daily. 10/19/22   Sridharan, Sriramkumar, MD  colchicine  0.6 MG tablet Take 1 tablet (0.6 mg total) by mouth daily. 10/02/23   Ghimire, Estil Heman, MD  dorzolamide  (TRUSOPT ) 2 % ophthalmic solution Place 1 drop into both eyes 2 (two) times daily.    [provider]  dutasteride  (AVODART ) 0.5 MG capsule Take 0.5 mg by mouth in the morning.    [provider]  latanoprost  (XALATAN ) 0.005 % ophthalmic solution Place 1 drop into both eyes at bedtime.    [provider]  melatonin 3 MG TABS tablet Take 3 mg by mouth at bedtime.    [provider]  memantine  (NAMENDA ) 10 MG tablet Take 10 mg by mouth 2 (two) times daily.    [provider]      Allergies    Penicillins, Levetiracetam , Aricept  [donepezil ], Exelon [rivastigmine], and Viagra [sildenafil]    Review of Systems   Review of Systems  Unable to perform ROS: Dementia (Level 5 caveat)  Cardiovascular:  Positive for chest pain.  All other systems reviewed and are negative.   Physical Exam Updated Vital Signs BP (!) 168/101   Pulse 61   Resp 16   Ht 5' 11 (1.803 m)   Wt 68.8 kg   SpO2 100%   BMI 21.14 kg/m  Physical Exam Vitals  and nursing note reviewed.  Constitutional:      General: He is not in acute distress.    Appearance: He is well-developed.  HENT:     Head: Normocephalic and atraumatic.  Eyes:     Conjunctiva/sclera: Conjunctivae normal.  Cardiovascular:     Rate and Rhythm: Normal rate and regular rhythm.     Heart sounds: No murmur heard. Pulmonary:     Effort: Pulmonary effort is normal. No respiratory distress.     Breath sounds: Normal breath sounds. No wheezing.  Abdominal:     Palpations: Abdomen is soft.     Tenderness: There is no abdominal tenderness.  Musculoskeletal:        General: No swelling.     Cervical back: Neck supple.     Right lower leg: No edema.      Left lower leg: No edema.  Skin:    General: Skin is warm and dry.     Capillary Refill: Capillary refill takes less than 2 seconds.  Neurological:     Mental Status: He is alert. Mental status is at baseline.     Comments: Moves all extremities in a coordinated and symmetric fashion.  Follows commands appropriately.  Psychiatric:        Mood and Affect: Mood normal.     ED Results / Procedures / Treatments   Labs (all labs ordered are listed, but only abnormal results are displayed) Labs Reviewed  CBC - Abnormal; Notable for the following components:      Result Value   Platelets 148 (*)    All other components within normal limits  BASIC METABOLIC PANEL WITH GFR - Abnormal; Notable for the following components:   Glucose, Bld 108 (*)    All other components within normal limits  TROPONIN I (HIGH SENSITIVITY)  TROPONIN I (HIGH SENSITIVITY)    EKG EKG Interpretation Date/Time:  Tuesday November 10 2023 22:45:14 EDT Ventricular Rate:  60 PR Interval:  169 QRS Duration:  153 QT Interval:  440 QTC Calculation: 440 R Axis:   270  Text Interpretation: atrial and ventricular pacing Confirmed by Mozell Arias [phone]) on 11/10/2023 11:10:39 PM  Radiology DG Chest Portable 1 View Result Date: 11/10/2023 CLINICAL DATA:  Chest pain EXAM: PORTABLE CHEST 1 VIEW COMPARISON:  10/13/2023 FINDINGS: Mild chronic Carlon Chester crash that mild stable elevation of the right hemidiaphragm. 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 Heart and mediastinal contours are within normal limits. No focal opacities or effusions. No acute bony abnormality. IMPRESSION: No active cardiopulmonary disease. Electronically Signed   By: Janeece Mechanic M.D.   On: 11/10/2023 23:14    Procedures Procedures    Medications Ordered in ED Medications - No data to display  ED Course/ Medical Decision Making/ A&P  Medical Decision Making Amount and/or Complexity of Data Reviewed Labs: ordered. Radiology:  ordered.   83 year old male presents for evaluation.  Please see HPI for further details.  On examination the patient is afebrile and nontachycardic.  Lung sounds are clear bilaterally, not hypoxic.  Abdomen soft and compressible.  Neurological examination at baseline.  No edema to bilateral lower extremities.  Patient overall nontoxic in appearance.  Reassuring vital signs.  Will assess with CBC, BMP, troponin x 2, EKG and chest x-ray.  CBC without leukocytosis or anemia.  Metabolic panel without electrolyte derangement, anion gap 5.  Creatinine 1.17.  Troponin 17, delta 15.  Chest x-ray unremarkable.  EKG nonischemic, shows atrial ventricular pacing.  Attempted multiple times to  get pacemaker interrogation sent to ED.  Multiple attempts were made however unsuccessful.  No syncope occurred tonight so we will defer.  At this time, patient will be discharged home.  He will follow-up with cardiology team.  Prior to discharge, I called the patient wife Wallene Gum and advised her of findings.  I advised her patient will need to follow-up as an outpatient with cardiology and she voiced understanding.  All questions answered to the patient and the patient wife satisfaction.  Stable to discharge home.  Final Clinical Impression(s) / ED Diagnoses Final diagnoses:  Chest pain, unspecified type    Rx / DC Orders ED Discharge Orders     None         Kristin Peyer 11/11/23 0155    Kelsey Patricia, MD 11/11/23 [phone]

## 2023-11-11 DIAGNOSIS — R001 Bradycardia, unspecified: Secondary | ICD-10-CM | POA: Diagnosis not present

## 2023-11-11 DIAGNOSIS — Z7401 Bed confinement status: Secondary | ICD-10-CM | POA: Diagnosis not present

## 2023-11-11 DIAGNOSIS — R071 Chest pain on breathing: Secondary | ICD-10-CM | POA: Diagnosis not present

## 2023-11-11 LAB — TROPONIN I (HIGH SENSITIVITY)
Troponin I (High Sensitivity): 15 ng/L (ref <18)
Troponin I (High Sensitivity): 17 ng/L (ref <18)

## 2023-11-11 NOTE — Discharge Instructions (Signed)
 It was a pleasure taking part in your care.  As discussed, the workup is reassuring.  No stress on heart noted.  All of the lab values were reassuring.  Please have the patient follow-up with his cardiology team.  Please have the patient return to the ER with any new or worsening symptoms.

## 2023-11-11 NOTE — ED Notes (Signed)
 PTAR CALLED 02:38

## 2023-11-13 NOTE — Progress Notes (Signed)
 Remote pacemaker transmission.

## 2023-11-16 ENCOUNTER — Telehealth: Payer: Self-pay

## 2023-11-16 DIAGNOSIS — G301 Alzheimer's disease with late onset: Secondary | ICD-10-CM

## 2023-11-17 ENCOUNTER — Telehealth: Payer: Self-pay | Admitting: *Deleted

## 2023-11-17 NOTE — Progress Notes (Signed)
 Complex Care Management Note Care Guide Note  11/17/2023 Name: Dustin Hamilton MRN: [id number] DOB: 1940-07-12   Complex Care Management Outreach Attempts: An unsuccessful telephone outreach was attempted today to offer the patient information about available complex care management services.  Follow Up Plan:  Additional outreach attempts will be made to offer the patient complex care management information and services.   Encounter Outcome:  No Answer  Kandis Ormond, CMA [place]  [facility], [facility] Guide Direct Dial: [phone]  Fax: [phone] Website: [email or web address]

## 2023-11-18 NOTE — Progress Notes (Signed)
 Complex Care Management Note Care Guide Note  11/18/2023 Name: Dustin Hamilton MRN: [id number] DOB: 02-Jul-1940   Complex Care Management Outreach Attempts: A second unsuccessful outreach was attempted today to offer the patient with information about available complex care management services.  Follow Up Plan:  Additional outreach attempts will be made to offer the patient complex care management information and services.   Encounter Outcome:  No Answer  Kandis Ormond, CMA [place]  [facility], [facility] Guide Direct Dial: [phone]  Fax: [phone] Website: [email or web address]

## 2023-11-19 NOTE — Progress Notes (Signed)
 Complex Care Management Note Care Guide Note  11/19/2023 Name: Dustin Hamilton MRN: [id number] DOB: 09/07/1940   Complex Care Management Outreach Attempts: A third unsuccessful outreach was attempted today to offer the patient with information about available complex care management services.  Follow Up Plan:  No further outreach attempts will be made at this time. We have been unable to contact the patient to offer or enroll patient in complex care management services.  Encounter Outcome:  No Answer  Kandis Ormond, CMA [place]  [facility], [facility] Guide Direct Dial: [phone]  Fax: [phone] Website: [email or web address]

## 2023-12-09 DIAGNOSIS — I639 Cerebral infarction, unspecified: Secondary | ICD-10-CM | POA: Diagnosis not present

## 2023-12-09 DIAGNOSIS — F0390 Unspecified dementia without behavioral disturbance: Secondary | ICD-10-CM | POA: Diagnosis not present

## 2023-12-09 DIAGNOSIS — E860 Dehydration: Secondary | ICD-10-CM | POA: Diagnosis not present

## 2023-12-09 DIAGNOSIS — I10 Essential (primary) hypertension: Secondary | ICD-10-CM | POA: Diagnosis not present

## 2023-12-30 ENCOUNTER — Observation Stay (HOSPITAL_COMMUNITY)

## 2023-12-30 ENCOUNTER — Emergency Department (HOSPITAL_COMMUNITY)

## 2023-12-30 ENCOUNTER — Other Ambulatory Visit: Payer: Self-pay

## 2023-12-30 ENCOUNTER — Inpatient Hospital Stay (HOSPITAL_COMMUNITY)
Admission: EM | Admit: 2023-12-30 | Discharge: 2024-01-04 | DRG: 064 | Disposition: A | Discharge status: Rehabilitation Facility | Attending: Internal Medicine | Admitting: Internal Medicine

## 2023-12-30 ENCOUNTER — Encounter (HOSPITAL_COMMUNITY): Payer: Self-pay | Admitting: Internal Medicine

## 2023-12-30 DIAGNOSIS — Z7982 Long term (current) use of aspirin: Secondary | ICD-10-CM

## 2023-12-30 DIAGNOSIS — F0150 Vascular dementia without behavioral disturbance: Secondary | ICD-10-CM | POA: Diagnosis present

## 2023-12-30 DIAGNOSIS — R29711 NIHSS score 11: Secondary | ICD-10-CM | POA: Diagnosis not present

## 2023-12-30 DIAGNOSIS — I4891 Unspecified atrial fibrillation: Secondary | ICD-10-CM | POA: Diagnosis present

## 2023-12-30 DIAGNOSIS — R1311 Dysphagia, oral phase: Secondary | ICD-10-CM | POA: Diagnosis present

## 2023-12-30 DIAGNOSIS — Z79899 Other long term (current) drug therapy: Secondary | ICD-10-CM

## 2023-12-30 DIAGNOSIS — I63532 Cerebral infarction due to unspecified occlusion or stenosis of left posterior cerebral artery: Principal | ICD-10-CM | POA: Diagnosis present

## 2023-12-30 DIAGNOSIS — I4892 Unspecified atrial flutter: Secondary | ICD-10-CM | POA: Diagnosis present

## 2023-12-30 DIAGNOSIS — Z8673 Personal history of transient ischemic attack (TIA), and cerebral infarction without residual deficits: Secondary | ICD-10-CM

## 2023-12-30 DIAGNOSIS — E782 Mixed hyperlipidemia: Secondary | ICD-10-CM | POA: Diagnosis present

## 2023-12-30 DIAGNOSIS — Z7902 Long term (current) use of antithrombotics/antiplatelets: Secondary | ICD-10-CM

## 2023-12-30 DIAGNOSIS — E042 Nontoxic multinodular goiter: Secondary | ICD-10-CM | POA: Diagnosis not present

## 2023-12-30 DIAGNOSIS — I2510 Atherosclerotic heart disease of native coronary artery without angina pectoris: Secondary | ICD-10-CM | POA: Diagnosis present

## 2023-12-30 DIAGNOSIS — I442 Atrioventricular block, complete: Secondary | ICD-10-CM | POA: Diagnosis not present

## 2023-12-30 DIAGNOSIS — G301 Alzheimer's disease with late onset: Secondary | ICD-10-CM

## 2023-12-30 DIAGNOSIS — H409 Unspecified glaucoma: Secondary | ICD-10-CM | POA: Diagnosis present

## 2023-12-30 DIAGNOSIS — R531 Weakness: Secondary | ICD-10-CM | POA: Diagnosis not present

## 2023-12-30 DIAGNOSIS — Z823 Family history of stroke: Secondary | ICD-10-CM

## 2023-12-30 DIAGNOSIS — Y900 Blood alcohol level of less than 20 mg/100 ml: Secondary | ICD-10-CM | POA: Diagnosis present

## 2023-12-30 DIAGNOSIS — N401 Enlarged prostate with lower urinary tract symptoms: Secondary | ICD-10-CM | POA: Diagnosis present

## 2023-12-30 DIAGNOSIS — F482 Pseudobulbar affect: Secondary | ICD-10-CM | POA: Diagnosis present

## 2023-12-30 DIAGNOSIS — Z860101 Personal history of adenomatous and serrated colon polyps: Secondary | ICD-10-CM

## 2023-12-30 DIAGNOSIS — I471 Supraventricular tachycardia, unspecified: Secondary | ICD-10-CM | POA: Diagnosis present

## 2023-12-30 DIAGNOSIS — Z950 Presence of cardiac pacemaker: Secondary | ICD-10-CM

## 2023-12-30 DIAGNOSIS — I6389 Other cerebral infarction: Secondary | ICD-10-CM | POA: Diagnosis present

## 2023-12-30 DIAGNOSIS — G219 Secondary parkinsonism, unspecified: Secondary | ICD-10-CM | POA: Diagnosis present

## 2023-12-30 DIAGNOSIS — N138 Other obstructive and reflux uropathy: Secondary | ICD-10-CM | POA: Diagnosis present

## 2023-12-30 DIAGNOSIS — Z1152 Encounter for screening for COVID-19: Secondary | ICD-10-CM

## 2023-12-30 DIAGNOSIS — I639 Cerebral infarction, unspecified: Secondary | ICD-10-CM

## 2023-12-30 DIAGNOSIS — Z6820 Body mass index (BMI) 20.0-20.9, adult: Secondary | ICD-10-CM

## 2023-12-30 DIAGNOSIS — I680 Cerebral amyloid angiopathy: Secondary | ICD-10-CM | POA: Diagnosis present

## 2023-12-30 DIAGNOSIS — R4701 Aphasia: Secondary | ICD-10-CM | POA: Diagnosis present

## 2023-12-30 DIAGNOSIS — R29818 Other symptoms and signs involving the nervous system: Secondary | ICD-10-CM | POA: Diagnosis present

## 2023-12-30 DIAGNOSIS — G4733 Obstructive sleep apnea (adult) (pediatric): Secondary | ICD-10-CM | POA: Diagnosis not present

## 2023-12-30 DIAGNOSIS — R29810 Facial weakness: Secondary | ICD-10-CM | POA: Diagnosis present

## 2023-12-30 DIAGNOSIS — I119 Hypertensive heart disease without heart failure: Secondary | ICD-10-CM | POA: Diagnosis present

## 2023-12-30 DIAGNOSIS — Z8261 Family history of arthritis: Secondary | ICD-10-CM

## 2023-12-30 DIAGNOSIS — R9089 Other abnormal findings on diagnostic imaging of central nervous system: Secondary | ICD-10-CM | POA: Diagnosis not present

## 2023-12-30 DIAGNOSIS — Z87891 Personal history of nicotine dependence: Secondary | ICD-10-CM

## 2023-12-30 DIAGNOSIS — I69391 Dysphagia following cerebral infarction: Secondary | ICD-10-CM

## 2023-12-30 DIAGNOSIS — G9389 Other specified disorders of brain: Secondary | ICD-10-CM | POA: Diagnosis not present

## 2023-12-30 DIAGNOSIS — I6501 Occlusion and stenosis of right vertebral artery: Secondary | ICD-10-CM | POA: Diagnosis not present

## 2023-12-30 DIAGNOSIS — E43 Unspecified severe protein-calorie malnutrition: Secondary | ICD-10-CM | POA: Diagnosis present

## 2023-12-30 DIAGNOSIS — F02B0 Dementia in other diseases classified elsewhere, moderate, without behavioral disturbance, psychotic disturbance, mood disturbance, and anxiety: Secondary | ICD-10-CM

## 2023-12-30 DIAGNOSIS — I6523 Occlusion and stenosis of bilateral carotid arteries: Secondary | ICD-10-CM | POA: Diagnosis not present

## 2023-12-30 DIAGNOSIS — F32A Depression, unspecified: Secondary | ICD-10-CM | POA: Insufficient documentation

## 2023-12-30 DIAGNOSIS — I6381 Other cerebral infarction due to occlusion or stenosis of small artery: Secondary | ICD-10-CM | POA: Diagnosis not present

## 2023-12-30 DIAGNOSIS — Z841 Family history of disorders of kidney and ureter: Secondary | ICD-10-CM

## 2023-12-30 DIAGNOSIS — Z880 Allergy status to penicillin: Secondary | ICD-10-CM

## 2023-12-30 DIAGNOSIS — E854 Organ-limited amyloidosis: Secondary | ICD-10-CM | POA: Diagnosis present

## 2023-12-30 DIAGNOSIS — R29705 NIHSS score 5: Secondary | ICD-10-CM | POA: Diagnosis present

## 2023-12-30 DIAGNOSIS — Z888 Allergy status to other drugs, medicaments and biological substances status: Secondary | ICD-10-CM

## 2023-12-30 DIAGNOSIS — I614 Nontraumatic intracerebral hemorrhage in cerebellum: Secondary | ICD-10-CM | POA: Diagnosis not present

## 2023-12-30 DIAGNOSIS — R471 Dysarthria and anarthria: Secondary | ICD-10-CM | POA: Diagnosis present

## 2023-12-30 DIAGNOSIS — I672 Cerebral atherosclerosis: Secondary | ICD-10-CM | POA: Diagnosis not present

## 2023-12-30 DIAGNOSIS — I10 Essential (primary) hypertension: Secondary | ICD-10-CM | POA: Diagnosis not present

## 2023-12-30 DIAGNOSIS — Z8249 Family history of ischemic heart disease and other diseases of the circulatory system: Secondary | ICD-10-CM

## 2023-12-30 DIAGNOSIS — F0390 Unspecified dementia without behavioral disturbance: Secondary | ICD-10-CM | POA: Diagnosis present

## 2023-12-30 DIAGNOSIS — I959 Hypotension, unspecified: Secondary | ICD-10-CM | POA: Diagnosis present

## 2023-12-30 DIAGNOSIS — K760 Fatty (change of) liver, not elsewhere classified: Secondary | ICD-10-CM | POA: Diagnosis present

## 2023-12-30 DIAGNOSIS — R270 Ataxia, unspecified: Secondary | ICD-10-CM | POA: Diagnosis present

## 2023-12-30 DIAGNOSIS — H518 Other specified disorders of binocular movement: Secondary | ICD-10-CM | POA: Diagnosis present

## 2023-12-30 DIAGNOSIS — F1011 Alcohol abuse, in remission: Secondary | ICD-10-CM | POA: Diagnosis present

## 2023-12-30 DIAGNOSIS — Z833 Family history of diabetes mellitus: Secondary | ICD-10-CM

## 2023-12-30 LAB — COMPREHENSIVE METABOLIC PANEL WITH GFR
ALT: 14 U/L (ref 0–44)
AST: 17 U/L (ref 15–41)
Albumin: 2.9 g/dL — ABNORMAL LOW (ref 3.5–5.0)
Alkaline Phosphatase: 50 U/L (ref 38–126)
Anion gap: 4 — ABNORMAL LOW (ref 5–15)
BUN: 16 mg/dL (ref 8–23)
CO2: 23 mmol/L (ref 22–32)
Calcium: 8 mg/dL — ABNORMAL LOW (ref 8.9–10.3)
Chloride: 117 mmol/L — ABNORMAL HIGH (ref 98–111)
Creatinine, Ser: 0.78 mg/dL (ref 0.61–1.24)
GFR, Estimated: >60 mL/min (ref >60)
Glucose, Bld: 86 mg/dL (ref 70–99)
Potassium: 3.3 mmol/L — ABNORMAL LOW (ref 3.5–5.1)
Sodium: 144 mmol/L (ref 135–145)
Total Bilirubin: 0.9 mg/dL (ref 0.0–1.2)
Total Protein: 5.6 g/dL — ABNORMAL LOW (ref 6.5–8.1)

## 2023-12-30 LAB — DIFFERENTIAL
Abs Immature Granulocytes: 0.01 K/uL (ref 0.00–0.07)
Basophils Absolute: 0 K/uL (ref 0.0–0.1)
Basophils Relative: 0 %
Eosinophils Absolute: 0.1 K/uL (ref 0.0–0.5)
Eosinophils Relative: 2 %
Immature Granulocytes: 0 %
Lymphocytes Relative: 17 %
Lymphs Abs: 0.8 K/uL (ref 0.7–4.0)
Monocytes Absolute: 0.3 K/uL (ref 0.1–1.0)
Monocytes Relative: 6 %
Neutro Abs: 3.5 K/uL (ref 1.7–7.7)
Neutrophils Relative %: 75 %

## 2023-12-30 LAB — CBC
HCT: 35.6 % — ABNORMAL LOW (ref 39.0–52.0)
Hemoglobin: 11.8 g/dL — ABNORMAL LOW (ref 13.0–17.0)
MCH: 32 pg (ref 26.0–34.0)
MCHC: 33.1 g/dL (ref 30.0–36.0)
MCV: 96.5 fL (ref 80.0–100.0)
Platelets: 92 K/uL — ABNORMAL LOW (ref 150–400)
RBC: 3.69 MIL/uL — ABNORMAL LOW (ref 4.22–5.81)
RDW: 13.9 % (ref 11.5–15.5)
WBC: 4.7 K/uL (ref 4.0–10.5)
nRBC: 0 % (ref 0.0–0.2)

## 2023-12-30 LAB — URINALYSIS, W/ REFLEX TO CULTURE (INFECTION SUSPECTED)
Bilirubin Urine: NEGATIVE
Glucose, UA: NEGATIVE mg/dL
Ketones, ur: NEGATIVE mg/dL
Nitrite: NEGATIVE
Protein, ur: 30 mg/dL — AB
RBC / HPF: >50 RBC/hpf (ref 0–5)
Specific Gravity, Urine: 1.01 (ref 1.005–1.030)
pH: 7 (ref 5.0–8.0)

## 2023-12-30 LAB — I-STAT CHEM 8, ED
BUN: 13 mg/dL (ref 8–23)
Calcium, Ion: 0.98 mmol/L — ABNORMAL LOW (ref 1.15–1.40)
Chloride: 117 mmol/L — ABNORMAL HIGH (ref 98–111)
Creatinine, Ser: 0.6 mg/dL — ABNORMAL LOW (ref 0.61–1.24)
Glucose, Bld: 71 mg/dL (ref 70–99)
HCT: 36 % — ABNORMAL LOW (ref 39.0–52.0)
Hemoglobin: 12.2 g/dL — ABNORMAL LOW (ref 13.0–17.0)
Potassium: 2.7 mmol/L — CL (ref 3.5–5.1)
Sodium: 150 mmol/L — ABNORMAL HIGH (ref 135–145)
TCO2: 17 mmol/L — ABNORMAL LOW (ref 22–32)

## 2023-12-30 LAB — CBG MONITORING, ED
Glucose-Capillary: 199 mg/dL — ABNORMAL HIGH (ref 70–99)
Glucose-Capillary: 66 mg/dL — ABNORMAL LOW (ref 70–99)
Glucose-Capillary: 108 mg/dL — ABNORMAL HIGH (ref 70–99)
Glucose-Capillary: 111 mg/dL — ABNORMAL HIGH (ref 70–99)

## 2023-12-30 LAB — APTT: aPTT: 31 s (ref 24–36)

## 2023-12-30 LAB — ETHANOL: Alcohol, Ethyl (B): <15 mg/dL (ref <15)

## 2023-12-30 LAB — MAGNESIUM: Magnesium: 1.6 mg/dL — ABNORMAL LOW (ref 1.7–2.4)

## 2023-12-30 LAB — PROTIME-INR
INR: 1.2 (ref 0.8–1.2)
Prothrombin Time: 16.3 s — ABNORMAL HIGH (ref 11.4–15.2)

## 2023-12-30 MED ORDER — SODIUM CHLORIDE 0.9% FLUSH
3.0000 mL | Freq: Two times a day (BID) | INTRAVENOUS | Status: DC
  Administered 2023-12-30 – 2024-01-04 (×10): 3 mL via INTRAVENOUS

## 2023-12-30 MED ORDER — DEXTROSE 50 % IV SOLN
INTRAVENOUS | Status: AC
  Filled 2023-12-30: qty 50

## 2023-12-30 MED ORDER — MELATONIN 3 MG PO TABS
3.0000 mg | ORAL_TABLET | Freq: Every day | ORAL | Status: DC
Start: 2023-12-30 — End: 2024-01-04
  Administered 2024-01-01 – 2024-01-03 (×3): 3 mg via ORAL
  Filled 2023-12-30 (×3): qty 1

## 2023-12-30 MED ORDER — ASPIRIN 81 MG PO CHEW
81.0000 mg | CHEWABLE_TABLET | Freq: Every day | ORAL | Status: DC
  Administered 2024-01-01 – 2024-01-03 (×3): 81 mg via ORAL
  Filled 2023-12-30 (×4): qty 1

## 2023-12-30 MED ORDER — DEXTROSE 50 % IV SOLN
INTRAVENOUS | Status: AC
  Administered 2023-12-30: 50 mL via INTRAVENOUS
  Filled 2023-12-30: qty 50

## 2023-12-30 MED ORDER — ACETAMINOPHEN 325 MG PO TABS
650.0000 mg | ORAL_TABLET | Freq: Four times a day (QID) | ORAL | Status: DC | PRN
Start: 2023-12-30 — End: 2024-01-04

## 2023-12-30 MED ORDER — POLYETHYLENE GLYCOL 3350 17 G PO PACK
17.0000 g | PACK | Freq: Every day | ORAL | Status: DC | PRN
Start: 2023-12-30 — End: 2024-01-04

## 2023-12-30 MED ORDER — POTASSIUM CHLORIDE CRYS ER 20 MEQ PO TBCR
40.0000 meq | EXTENDED_RELEASE_TABLET | Freq: Once | ORAL | Status: DC
  Filled 2023-12-30: qty 2

## 2023-12-30 MED ORDER — DEXTROSE 50 % IV SOLN: 1.0000 | Freq: Once | INTRAVENOUS | Status: AC

## 2023-12-30 MED ORDER — SODIUM CHLORIDE 0.9% FLUSH
3.0000 mL | Freq: Once | INTRAVENOUS | Status: AC
  Administered 2023-12-30: 3 mL via INTRAVENOUS

## 2023-12-30 MED ORDER — DUTASTERIDE 0.5 MG PO CAPS
0.5000 mg | ORAL_CAPSULE | Freq: Every day | ORAL | Status: DC
  Filled 2023-12-30 (×5): qty 1

## 2023-12-30 MED ORDER — MEMANTINE HCL 10 MG PO TABS
10.0000 mg | ORAL_TABLET | Freq: Two times a day (BID) | ORAL | Status: DC
  Administered 2024-01-01 – 2024-01-03 (×5): 10 mg via ORAL
  Filled 2023-12-30 (×6): qty 1

## 2023-12-30 MED ORDER — ACETAMINOPHEN 650 MG RE SUPP: 650.0000 mg | Freq: Four times a day (QID) | RECTAL | Status: DC | PRN

## 2023-12-30 MED ORDER — STROKE: EARLY STAGES OF RECOVERY BOOK
Freq: Once | Status: AC
  Filled 2023-12-30: qty 1

## 2023-12-30 MED ORDER — IOHEXOL 350 MG/ML SOLN
75.0000 mL | Freq: Once | INTRAVENOUS | Status: AC | PRN
  Administered 2023-12-30: 75 mL via INTRAVENOUS

## 2023-12-30 MED ORDER — LACTATED RINGERS IV BOLUS
500.0000 mL | Freq: Once | INTRAVENOUS | Status: AC
  Administered 2023-12-30: 500 mL via INTRAVENOUS

## 2023-12-30 MED ORDER — MAGNESIUM OXIDE -MG SUPPLEMENT 400 (240 MG) MG PO TABS
800.0000 mg | ORAL_TABLET | Freq: Once | ORAL | Status: DC
  Filled 2023-12-30: qty 2

## 2023-12-30 NOTE — ED Notes (Signed)
 Pt returned from MRI with RN accompanying. Pt tolerated well and no complaints noted.

## 2023-12-30 NOTE — ED Provider Notes (Signed)
  Physical Exam  BP (!) 155/95   Pulse 78   Temp 97.6 F (36.4 C) (Oral)   Resp 18   Wt 67 kg   SpO2 100%   BMI 20.60 kg/m   Physical Exam Constitutional:      General: He is not in acute distress.    Appearance: Normal appearance.  HENT:     Head: Normocephalic and atraumatic.     Nose: No congestion or rhinorrhea.  Eyes:     General:        Right eye: No discharge.        Left eye: No discharge.     Extraocular Movements: Extraocular movements intact.     Pupils: Pupils are equal, round, and reactive to light.  Cardiovascular:     Rate and Rhythm: Normal rate and regular rhythm.     Heart sounds: No murmur heard. Pulmonary:     Effort: No respiratory distress.     Breath sounds: No wheezing or rales.  Abdominal:     General: There is no distension.     Tenderness: There is no abdominal tenderness.  Musculoskeletal:        General: Normal range of motion.     Cervical back: Normal range of motion.  Skin:    General: Skin is warm and dry.  Neurological:     General: No focal deficit present.     Mental Status: He is alert.     Motor: Weakness present.     Procedures  Procedures  ED Course / MDM   Clinical Course as of 12/30/23 1810  Wed Dec 30, 2023  1555 LUE weakness, symptoms persistent, needs MRI [MK]    Clinical Course User Index [MK] Rei Contee, MD   Medical Decision Making Amount and/or Complexity of Data Reviewed Labs: ordered. Radiology: ordered.  Risk OTC drugs. Prescription drug management. Decision regarding hospitalization.   Patient received in handoff.  Persistent left-sided weakness and ultimately require angiography and MRI.  Patient admitted       Albertina Dixon, MD 12/30/23 [phone]

## 2023-12-30 NOTE — Progress Notes (Signed)
 Pt admitted from ED with stroke symptoms, alert to self, follows simple command, denies any pain at this time, settled in bed with call light and family at bedside, tele monitor put and verified, was however reassured and will continue to monitor. Ob[place]-Asidi, Alvetta Hidrogo Efe

## 2023-12-30 NOTE — Consult Note (Signed)
 NEUROLOGY CONSULT NOTE   Date of service: December 30, 2023 Patient Name: Dustin Hamilton MRN:  [id number] DOB:  09-Apr-1941 Chief Complaint: code stroke Requesting Provider: Doretha Folks, MD  History of Present Illness  Dustin Hamilton is a 83 y.o. 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  He was brought in as a code stroke.  Glucose of 66 on arrival to the ED.  Last seen normal is 2200 last night.  LKW: 2200 Modified rankin score: 3-Moderate disability-requires help but walks WITHOUT assistance IV Thrombolysis: Not offered, outside of window, prior history of cerebral amyloid angiopathy, prior history of ICH.   EVT: Not offered, no LVO.    NIHSS components Score: Comment  1a Level of Conscious 0[]  1[]  2[]  3[]      1b LOC Questions 0[]  1[]  2[x]       1c LOC Commands 0[x]  1[]  2[]       2 Best Gaze 0[x]  1[]  2[]       3 Visual 0[x]  1[]  2[]  3[]      4 Facial Palsy 0[]  1[x]  2[]  3[]      5a Motor Arm - left 0[x]  1[]  2[]  3[]  4[]  UN[]    5b Motor Arm - Right 0[x]  1[]  2[]  3[]  4[]  UN[]    6a Motor Leg - Left 0[x]  1[]  2[]  3[]  4[]  UN[]    6b Motor Leg - Right 0[x]  1[]  2[]  3[]  4[]  UN[]    7 Limb Ataxia 0[x]  1[]  2[]  UN[]      8 Sensory 0[x]  1[]  2[]  UN[]      9 Best Language 0[]  1[]  2[x]  3[]      10 Dysarthria 0[x]  1[]  2[]  UN[]      11 Extinct. and Inattention 0[x]  1[]  2[]       TOTAL: 5      ROS  Unable to ascertain due to confusion.  Past History   Past Medical History:  Diagnosis Date   BPH (benign prostatic hyperplasia)    Cerebrovascular disease    Clostridium difficile diarrhea    Congenital anomaly of diaphragm    Elevated PSA    Glaucoma, both eyes    Hemorrhoid    Hepatitis B surface antigen positive     02-20-2011   History of adenomatous polyp of colon    2007, 2009 and 2013  tubular adenoma's   History of alcohol  abuse    quit 1963   History of cerebral parenchymal hemorrhage    01/ 2006  left occiptial lobe related to hypertensive crisis   History of CVA (cerebrovascular accident)    09-12-2012  left hippocampus/ amygdala junction and per MRI old white matter infarcts--  per pt residual short- term memory issues   History of fatty infiltration of liver hx visit's at 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   HTN (hypertension)    Renal cyst, left    Stroke (HCC)    hx of 3 strokes in past    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    Urethral lesion    urethral mass    Past Surgical History:  Procedure  Laterality Date   CARDIOVASCULAR STRESS TEST  05/05/2007   normal nuclear study w/ no ischemia/  normal LV fucntion and wall motion , ef60%   COLONOSCOPY  last one 04-06-2012   CYSTO/  LEFT RETROGRADE PYELOGRAM/ CYTOLOGY WASHINGS/  URETEROSCOPY  03/05/2000   INGUINAL HERNIA REPAIR Bilateral 1965 and 1980's   IR 3D INDEPENDENT WKST  07/31/2022   IR ANGIOGRAM PELVIS SELECTIVE OR SUPRASELECTIVE  07/31/2022   IR ANGIOGRAM SELECTIVE EACH ADDITIONAL VESSEL  07/31/2022   IR ANGIOGRAM SELECTIVE EACH ADDITIONAL VESSEL  07/31/2022   IR ANGIOGRAM SELECTIVE EACH ADDITIONAL VESSEL  07/31/2022   IR ANGIOGRAM SELECTIVE EACH ADDITIONAL VESSEL  07/31/2022   IR EMBO TUMOR ORGAN ISCHEMIA INFARCT INC GUIDE ROADMAPPING  07/31/2022   IR RADIOLOGIST EVAL & MGMT  06/26/2022   IR RADIOLOGIST EVAL & MGMT  08/08/2022   IR RADIOLOGIST EVAL & MGMT  08/21/2022   IR RADIOLOGIST EVAL & MGMT  03/25/2023   IR US  GUIDE VASC ACCESS RIGHT  07/31/2022   LAPAROSCOPIC INGUINAL HERNIA WITH UMBILICAL HERNIA Right 06/24/2007   LIVER  BIOPSY  1980's and 2004   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   Procedure: PACEMAKER IMPLANT;  Surgeon: Waddell Danelle ORN, MD;  Location: [facility];  Service: Cardiovascular;  Laterality: N/A;   SVT ABLATION N/A 11/15/2018   Procedure: SVT ABLATION;  Surgeon: Waddell Danelle ORN, MD;  Location: [facility];  Service: Cardiovascular;  Laterality: N/A;   TRANSTHORACIC ECHOCARDIOGRAM  09/13/2012   moderate LVH,  ef 60-65%/     TRANSURETHRAL RESECTION OF BLADDER TUMOR N/A 08/11/2016   Procedure: TRANSURETHRAL RESECTION OF BLADDER TUMOR (TURBT);  Surgeon: Belvie LITTIE Clara, MD;  Location: [facility];  Service: Urology;  Laterality: N/A;    Family History: Family History  Problem Relation Age of Onset   Rheum arthritis Mother    Diabetes Mother    Stroke Mother    Heart attack Mother    Kidney failure Mother    Heart attack Father    Heart disease Maternal Grandmother    Rheum arthritis Maternal Grandmother    Diabetes Maternal Grandmother    Stroke Maternal Grandmother    Colon cancer Neg Hx     Social History  reports that he quit smoking about 42 years ago. His smoking use included cigarettes. He started smoking about 47 years ago. He has a 5 pack-year smoking history. He has never used smokeless tobacco. He reports that he does not drink alcohol  and does not use drugs.  Allergies  Allergen Reactions   Penicillins Hives   Levetiracetam  Other (See Comments)    Altered mental status   Aricept  [Donepezil ] Other (See Comments)    Dizziness    Exelon [Rivastigmine] Other (See Comments)    Malaise    Viagra [Sildenafil] Palpitations and Other (See Comments)    Dizziness, also     Medications   Current Facility-Administered Medications:    sodium chloride  flush (NS) 0.9 % injection 3 mL, 3 mL, Intravenous, Once, Plunkett, Whitney, MD  Current Outpatient Medications:    allopurinol  (ZYLOPRIM ) 100 MG tablet, Take 1 tablet (100 mg total) by mouth daily.,  Disp: 30 tablet, Rfl: 2   amLODipine  (NORVASC ) 10 MG tablet, Take 5 mg by mouth daily., Disp: , Rfl:    aspirin  81 MG chewable tablet, Chew 1 tablet (81 mg total) by mouth daily., Disp: , Rfl:    brimonidine  (ALPHAGAN ) 0.2 % ophthalmic solution, Place 1 drop into both eyes 2 (  two) times daily., Disp: 5 mL, Rfl: 12   colchicine  0.6 MG tablet, Take 1 tablet (0.6 mg total) by mouth daily., Disp: 30 tablet, Rfl: 0   dorzolamide  (TRUSOPT ) 2 % ophthalmic solution, Place 1 drop into both eyes 2 (two) times daily., Disp: , Rfl:    dutasteride  (AVODART ) 0.5 MG capsule, Take 0.5 mg by mouth in the morning., Disp: , Rfl:    latanoprost  (XALATAN ) 0.005 % ophthalmic solution, Place 1 drop into both eyes at bedtime., Disp: , Rfl:    melatonin 3 MG TABS tablet, Take 3 mg by mouth at bedtime., Disp: , Rfl:    memantine  (NAMENDA ) 10 MG tablet, Take 10 mg by mouth 2 (two) times daily., Disp: , Rfl:   Vitals   Vitals:   2024/01/06 1103 January 06, 2024 1105 January 06, 2024 1245 2024-01-06 1300  BP:  (!) 166/91 121/82 (!) 150/98  Pulse:  70 64 87  Resp:   17   Temp:      SpO2:  99% 99% 100%  Weight: 67 kg       Body mass index is 20.6 kg/m.   Physical Exam   General: Laying comfortably in bed; in no acute distress.  HENT: Normal oropharynx and mucosa. Normal external appearance of ears and nose.  Neck: Supple, no pain or tenderness  CV: No JVD. No peripheral edema.  Pulmonary: Symmetric Chest rise. Normal respiratory effort.  Abdomen: Soft to touch, non-tender.  Ext: No cyanosis, edema, or deformity  Skin: No rash. Normal palpation of skin.   Musculoskeletal: Normal digits and nails by inspection. No clubbing.   Neurologic Examination  Mental status/Cognition: Slightly drowsy, oriented to self only.  Poor attention. Speech/language: Nonfluent, encephalopathic, able to follow simple commands.  Names a couple but not all objects. Cranial nerves:   CN II Pupils equal and reactive to light, no VF deficits    CN  III,IV,VI EOM intact, no gaze preference or deviation, no nystagmus    CN V normal sensation in V1, V2, and V3 segments bilaterally    CN VII Left facial droop.   CN VIII normal hearing to speech    CN IX & X normal palatal elevation, no uvular deviation    CN XI 5/5 head turn and 5/5 shoulder shrug bilaterally    CN XII midline tongue protrusion    Motor:  Muscle bulk: Poor Only limited motor exam could not perform secondary to poor participation. Patient is able to hold bilateral upper extremities off the bed for more than 10 seconds without any drift.  Patient is able to hold bilateral lower extremities off the bed for more than 5 seconds without any drift.  Sensation:  Light touch Grossly intact throughout   Pin prick    Temperature    Vibration   Proprioception    Coordination/Complex Motor:  - Finger to Nose intact bilaterally - Heel to shin unable to do - Rapid alternating movement are slowed - Gait: Deferred for patient safety. Labs/Imaging/Neurodiagnostic studies   CBC:  Recent Labs  Lab 2024/01/06 1044 01/06/2024 1050  WBC 4.7  --   NEUTROABS 3.5  --   HGB 11.8* 12.2*  HCT 35.6* 36.0*  MCV 96.5  --   PLT 92*  --    Basic Metabolic Panel:  Lab Results  Component Value Date   NA 150 (H) 01/06/24   K 2.7 (LL) 2024/01/06   CO2 23 Jan 06, 2024   GLUCOSE 71 01/06/2024   BUN 13 01/06/2024   CREATININE 0.60 (L)  12/30/2023   CALCIUM  8.0 (L) 12/30/2023   GFRNONAA >60 12/30/2023   GFRAA 60 (L) 02/28/2020   Lipid Panel:  Lab Results  Component Value Date   LDLCALC 67 03/18/2023   HgbA1c:  Lab Results  Component Value Date   HGBA1C 5.5 08/07/2023   Urine Drug Screen:     Component Value Date/Time   LABOPIA NONE DETECTED 03/14/2023 1935   COCAINSCRNUR NONE DETECTED 03/14/2023 1935   LABBENZ NONE DETECTED 03/14/2023 1935   AMPHETMU NONE DETECTED 03/14/2023 1935   THCU NONE DETECTED 03/14/2023 1935   LABBARB NONE DETECTED 03/14/2023 1935    Alcohol  Level      Component Value Date/Time   ETH <15 12/30/2023 1044   INR  Lab Results  Component Value Date   INR 1.2 12/30/2023   APTT  Lab Results  Component Value Date   APTT 31 12/30/2023   AED levels: No results found for: PHENYTOIN, ZONISAMIDE, LAMOTRIGINE, LEVETIRACETA  CT Head without contrast(Personally reviewed): CTH was negative for a large hypodensity concerning for a large territory infarct or hyperdensity concerning for an ICH   CT angio Head and Neck with contrast(Personally reviewed): Multifocal multivessel intracranial atherosclerotic disease but no LVO.  MRI Brain(Personally reviewed): Pending  ASSESSMENT   MELROY BOUGHER is a 83 y.o. 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  Symptoms concerning for either amyloid spells versus an acute infarct.  However, even if the patient has a new stroke, he is unlikely to benefit from full workup as he is already on aspirin  which is the maximal medical management for stroke patients with cerebral amyloid angiopathy. RECOMMENDATIONS  - Frequent Neuro checks per stroke unit protocol - Recommend brain imaging with MRI Brain without contrast - No need to obtain TTE from a stroke standpoint as anticoagulation is contraindicated in CAA. - Recommend obtaining Lipid panel with LDL - Please start statin if LDL > 70 - Recommend HbA1c to evaluate for diabetes and how well it is controlled. - Antithrombotic -continue aspirin  milligrams daily. - Recommend DVT ppx - SBP goal -aim for SBP less than 180. - Recommend Telemetry monitoring for arrythmia - Recommend bedside swallow screen prior to PO intake. - Stroke education booklet - Recommend PT/OT/SLP  consult  ______________________________________________________________________    Signed, Ammon Muscatello, MD [facility]

## 2023-12-30 NOTE — H&P (Addendum)
 History and Physical   Dustin Hamilton FMW:[id number] DOB: 1940-08-28 DOA: 12/30/2023  PCP: [facility]   Patient coming from: Home  Chief Complaint: Left side deficits, Code Stroke  HPI: Dustin Hamilton is a 83 y.o. male with medical history significant of hypertension, hyperlipidemia, TIA, CVA, amyloid angiopathy, paroxysmal SVT status post ablation, complete heart block status post pacemaker, secondary parkinsonism, dementia, depression, OSA, gout, BPH, glaucoma, chronic pain presenting with left-sided deficits as a code stroke.  Patient's last normal was last night.  Woke up this morning with left facial droop, left-sided weakness, right gaze deviation, unable to speak.  At baseline he is confused but is able to walk, typically able to feed self and have short conversations per wife.  Patient unable to participate in review of systems at this time due to dementia and speech issues.  ED Course: Vital signs in the ED notable for blood pressure in the 120s-160s systolic.  Lab workup included CMP with potassium 3.3, chloride 117, calcium  8.0, protein 5.6, albumin 2.9.  CBC with hemoglobin 11.8 down from baseline of 15-16, platelets 92.  PT 16.3, INR normal, PTT normal.  Ethanol level negative.  Urinalysis with hemoglobin, protein, small leukocytes, rare bacteria.  CT head showed no acute abnormality.  MR brain pending.  Patient received dextrose , 500 cc IV fluid, 40 mEq p.o. potassium, p.o. magnesium .  Neurology consulted and felt his presentation is more consistent with a flare of his amyloid angiopathy.  Did recommend an MRI and full stroke workup if positive.  Review of Systems: As per HPI otherwise all other systems reviewed and are negative.  Past Medical History:  Diagnosis Date   AMS (altered mental status) 03/15/2023   ARF (acute renal failure) (HCC) 09/30/2023   BPH (benign prostatic hyperplasia)    Cerebral infarction (HCC) 09/13/2012   IMO SNOMED Dx Update  Oct 2024     Cerebrovascular disease    Clostridium difficile diarrhea    Congenital anomaly of diaphragm    Elevated PSA    Glaucoma, both eyes    Hemorrhoid    Hepatitis B surface antigen positive    02-20-2011   History of adenomatous polyp of colon    2007, 2009 and 2013  tubular adenoma's   History of alcohol  abuse    quit 1963   History of cerebral parenchymal hemorrhage    01/ 2006  left occiptial lobe related to hypertensive crisis   History of CVA (cerebrovascular accident)    09-12-2012  left hippocampus/ amygdala junction and per MRI old white matter infarcts--  per pt residual short- term memory issues   History of fatty infiltration of liver hx visit's at 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    Urethral lesion    urethral mass   UTI (urinary tract infection) 03/15/2023    Past Surgical History:  Procedure Laterality Date   CARDIOVASCULAR STRESS TEST  05/05/2007   normal nuclear study w/ no ischemia/  normal LV fucntion and wall motion , ef60%   COLONOSCOPY  last  one 04-06-2012   CYSTO/  LEFT RETROGRADE PYELOGRAM/ CYTOLOGY WASHINGS/  URETEROSCOPY  03/05/2000   INGUINAL HERNIA REPAIR Bilateral 1965 and 1980's   IR 3D INDEPENDENT WKST  07/31/2022   IR ANGIOGRAM PELVIS SELECTIVE OR SUPRASELECTIVE  07/31/2022   IR ANGIOGRAM SELECTIVE EACH ADDITIONAL VESSEL  07/31/2022   IR ANGIOGRAM SELECTIVE EACH ADDITIONAL VESSEL  07/31/2022   IR ANGIOGRAM SELECTIVE EACH ADDITIONAL VESSEL  07/31/2022   IR ANGIOGRAM SELECTIVE EACH ADDITIONAL VESSEL  07/31/2022   IR EMBO TUMOR ORGAN ISCHEMIA INFARCT INC GUIDE ROADMAPPING  07/31/2022   IR RADIOLOGIST  EVAL & MGMT  06/26/2022   IR RADIOLOGIST EVAL & MGMT  08/08/2022   IR RADIOLOGIST EVAL & MGMT  08/21/2022   IR RADIOLOGIST EVAL & MGMT  03/25/2023   IR US  GUIDE VASC ACCESS RIGHT  07/31/2022   LAPAROSCOPIC INGUINAL HERNIA WITH UMBILICAL HERNIA Right 06/24/2007   LIVER BIOPSY  1980's and 2004   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   Procedure: PACEMAKER IMPLANT;  Surgeon: Waddell Danelle ORN, MD;  Location: [facility];  Service: Cardiovascular;  Laterality: N/A;   SVT ABLATION N/A 11/15/2018   Procedure: SVT ABLATION;  Surgeon: Waddell Danelle ORN, MD;  Location: [facility];  Service: Cardiovascular;  Laterality: N/A;   TRANSTHORACIC ECHOCARDIOGRAM  09/13/2012   moderate LVH,  ef 60-65%/     TRANSURETHRAL RESECTION OF BLADDER TUMOR N/A 08/11/2016   Procedure: TRANSURETHRAL RESECTION OF BLADDER TUMOR (TURBT);  Surgeon: Belvie LITTIE Clara, MD;  Location: [facility];  Service: Urology;  Laterality: N/A;    Social History  reports that he quit smoking about 42 years ago. His smoking use included cigarettes. He started smoking about 47 years ago. He has a 5 pack-year smoking history. He has never used smokeless tobacco. He reports that he does not drink alcohol  and does not use drugs.  Allergies  Allergen Reactions   Penicillins Hives   Levetiracetam  Other (See Comments)    Altered mental status   Aricept  Cody.Collard ] Other (See Comments)    Dizziness    Exelon [Rivastigmine] Other (See Comments)    Malaise    Viagra [Sildenafil] Palpitations and Other (See Comments)    Dizziness, also     Family History  Problem Relation Age of Onset   Rheum arthritis Mother    Diabetes Mother    Stroke Mother    Heart attack Mother    Kidney failure Mother    Heart attack Father    Heart disease Maternal Grandmother    Rheum arthritis Maternal Grandmother    Diabetes Maternal Grandmother    Stroke Maternal Grandmother    Colon cancer Neg Hx   Reviewed on  admission  Prior to Admission medications   Medication Sig Start Date End Date Taking? Authorizing Provider  allopurinol  (ZYLOPRIM ) 100 MG tablet Take 1 tablet (100 mg total) by mouth daily. 08/07/23 08/06/24  Krishnan, Gokul, MD  amLODipine  (NORVASC ) 10 MG tablet Take 5 mg by mouth daily.    [provider]  aspirin  81 MG chewable tablet Chew 1 tablet (81 mg total) by mouth daily. 03/22/23   Elgergawy, Brayton RAMAN, MD  brimonidine  (ALPHAGAN ) 0.2 % ophthalmic solution Place 1 drop into both eyes 2 (two) times daily. 10/19/22   Sridharan, Sriramkumar, MD  colchicine  0.6 MG tablet Take 1 tablet (0.6 mg total) by mouth daily. 10/02/23   Ghimire, Donalda HERO, MD  dorzolamide  (TRUSOPT ) 2 % ophthalmic solution Place 1 drop into both eyes 2 (  two) times daily.    [provider]  dutasteride  (AVODART ) 0.5 MG capsule Take 0.5 mg by mouth in the morning.    [provider]  latanoprost  (XALATAN ) 0.005 % ophthalmic solution Place 1 drop into both eyes at bedtime.    [provider]  melatonin 3 MG TABS tablet Take 3 mg by mouth at bedtime.    [provider]  memantine  (NAMENDA ) 10 MG tablet Take 10 mg by mouth 2 (two) times daily.    [provider]    Physical Exam: Vitals:   12/30/23 1245 12/30/23 1300 12/30/23 1415 12/30/23 1540  BP: 121/82 (!) 150/98 (!) 163/98   Pulse: 64 87 71   Resp: 17  18   Temp:    97.6 F (36.4 C)  TempSrc:    Oral  SpO2: 99% 100% 99%   Weight:        Physical Exam Constitutional:      General: He is not in acute distress.    Appearance: Normal appearance.  HENT:     Head: Normocephalic and atraumatic.     Mouth/Throat:     Mouth: Mucous membranes are moist.     Pharynx: Oropharynx is clear.  Eyes:     Extraocular Movements: Extraocular movements intact.     Pupils: Pupils are equal, round, and reactive to light.  Cardiovascular:     Rate and Rhythm: Normal rate and regular rhythm.     Pulses: Normal pulses.      Heart sounds: Normal heart sounds.  Pulmonary:     Effort: Pulmonary effort is normal. No respiratory distress.     Breath sounds: Normal breath sounds.  Abdominal:     General: Bowel sounds are normal. There is no distension.     Palpations: Abdomen is soft.     Tenderness: There is no abdominal tenderness.  Musculoskeletal:        General: No swelling or deformity.  Skin:    General: Skin is warm and dry.  Neurological:     Comments: Mental Status: Patient is awake, alert,  III,IV, VI: Right Gaze preference V: Facial sensation is intact VII: mild L facial droop  VIII: hearing is intact to voice XI: Shoulder shrug is symmetric. XII: tongue is midline without atrophy or fasciculations.  Motor: Good effort thorughout, at Least 4-5/5 bilateral UE, 3-4/5 LLE, 4-5/5 RLE.Sensory: Sensation is grossly intact bilateral UEs & LEs (Patient indicant sensation is different on the Left but unable to speak/describe how it is different).    Labs on Admission: I have personally reviewed following labs and imaging studies  CBC: Recent Labs  Lab 12/30/23 1044 12/30/23 1050  WBC 4.7  --   NEUTROABS 3.5  --   HGB 11.8* 12.2*  HCT 35.6* 36.0*  MCV 96.5  --   PLT 92*  --     Basic Metabolic Panel: Recent Labs  Lab 12/30/23 1044 12/30/23 1050  NA 144 150*  K 3.3* 2.7*  CL 117* 117*  CO2 23  --   GLUCOSE 86 71  BUN 16 13  CREATININE 0.78 0.60*  CALCIUM  8.0*  --     GFR: Estimated Creatinine Clearance: 66.3 mL/min (A) (by C-G formula based on SCr of 0.6 mg/dL (L)).  Liver Function Tests: Recent Labs  Lab 12/30/23 1044  AST 17  ALT 14  ALKPHOS 50  BILITOT 0.9  PROT 5.6*  ALBUMIN 2.9*    Urine analysis:    Component Value Date/Time  COLORURINE STRAW (A) 12/30/2023 1340   APPEARANCEUR CLEAR 12/30/2023 1340   LABSPEC 1.010 12/30/2023 1340   PHURINE 7.0 12/30/2023 1340   GLUCOSEU NEGATIVE 12/30/2023 1340   GLUCOSEU NEGATIVE 02/17/2011 0930   HGBUR MODERATE (A)  12/30/2023 1340   BILIRUBINUR NEGATIVE 12/30/2023 1340   KETONESUR NEGATIVE 12/30/2023 1340   PROTEINUR 30 (A) 12/30/2023 1340   UROBILINOGEN 1.0 09/12/2012 1802   NITRITE NEGATIVE 12/30/2023 1340   LEUKOCYTESUR SMALL (A) 12/30/2023 1340    Radiological Exams on Admission: CT HEAD CODE STROKE WO CONTRAST` Result Date: 12/30/2023 CLINICAL DATA:  Code stroke. 83 year old male with weakness and leftward gaze. EXAM: CT HEAD WITHOUT CONTRAST TECHNIQUE: Contiguous axial images were obtained from the base of the skull through the vertex without intravenous contrast. RADIATION DOSE REDUCTION: This exam was performed according to the departmental dose-optimization program which includes automated exposure control, adjustment of the mA and/or kV according to patient size and/or use of iterative reconstruction technique. COMPARISON:  Brain MRI 03/17/2023.  Head CT 10/20/2023. FINDINGS: Brain: Stable cerebral volume. Chronic encephalomalacia left PCA territory. Chronic nonspecific ventriculomegaly. Chronic bilateral cerebral white matter hypodensity which is confluent. Chronic heterogeneity in the deep gray nuclei. Stable gray-white matter differentiation throughout the brain. No midline shift, mass effect, evidence of mass lesion, intracranial hemorrhage or evidence of cortically based acute infarction. Vascular: Advanced Calcified atherosclerosis at the skull base. No suspicious intracranial vascular hyperdensity. Skull: Stable and intact. Sinuses/Orbits: Visualized paranasal sinuses and mastoids are stable and well aerated. Other: No gaze deviation at this time. No acute scalp soft tissue findings. ASPECTS [facility] Stroke Program Early CT Score) Total score (0-10 with 10 being normal): 10 IMPRESSION: 1. No acute cortically based infarct or acute intracranial hemorrhage identified. ASPECTS 10. 2.  Stable non contrast CT appearance of chronic ischemic disease. 3. These results were communicated to Dr. Vanessa at  10:57 am on 12/30/2023 by text page via the [facility] messaging system. Electronically Signed   By: VEAR Hurst M.D.   On: 12/30/2023 10:58   EKG: Independently reviewed.  Sinus rhythm at 71 bpm.  Nonspecific T wave changes.  Low voltage in multiple leads.  Possibly some discernible pacer spikes, but very small if present.  Assessment/Plan Active Problems:   Essential hypertension   History of TIA (transient ischemic attack) and stroke   Benign prostatic hyperplasia with urinary obstruction   Paroxysmal SVT (supraventricular tachycardia) (HCC)   Complete heart block (HCC)   Dementia (HCC)   Glaucoma   Mixed hyperlipidemia   Obstructive sleep apnea   Secondary parkinsonism, unspecified (HCC)   Status cardiac pacemaker   Focal neurologic deficit Stroke versus amyloid angiopathy > Woke with left-sided deficits as per HPI. > CT head showed no acute Sharol. > Seen by neurology who feel his presentation is more consistent with flare of his amyloid angiopathy rather than CVA. > Recommend MRI brain and CV workup if positive. - Monitor on telemetry overnight - Appreciate neurology recommendations and assistance - Swallow screen - Permissive hypertension pending workup - PT/OT/SLP - Follow-up MRI, delayed due to pacemaker, hopefully this evening - Continue home ASA - Further workup pending results of MRI brain ADDENDUM > MR Brain positive for acute CVA. CTA H/N with new focal mod steno at R M1, Stable occlusion of L P2, Focal Mof r V4 steno, Mild carotid athero. - Statin, pending lipid panel result  - Echocardiogram  - A1C  - Lipid panel   Hypertension - Permissive hypertension as above  Hyperlipidemia - Not  on medication for this  History of TIA - Noted  Paroxysmal SVT - Status post ablation  Complete heart block > Status post pacemaker.  Some possible pacer spikes noted on EKG but subtle. - Noted  Dementia - Continue home Namenda   BPH - Continue home  dutasteride   Glaucoma - Confirm home eyedrops and continue  OSA - Not on CPAP per chart review   DVT prophylaxis: SCDs for now Code Status:   Full Family Communication:  Updated at beside  Disposition Plan:   Patient is from:  Home  Anticipated DC to:  Home  Anticipated DC date:  1 to 3 days  Anticipated DC barriers: None  Consults called:  Neurology Admission status:  Observation, telemetry  Severity of Illness: The appropriate patient status for this patient is OBSERVATION. Observation status is judged to be reasonable and necessary in order to provide the required intensity of service to ensure the patient's safety. The patient's presenting symptoms, physical exam findings, and initial radiographic and laboratory data in the context of their medical condition is felt to place them at decreased risk for further clinical deterioration. Furthermore, it is anticipated that the patient will be medically stable for discharge from the hospital within 2 midnights of admission.    Marsa KATHEE Scurry MD [facility]  How to contact the [facility] Attending or Consulting provider 7A - 7P or covering provider during after hours 7P -7A, for this patient?   Check the care team in [facility] and look for a) attending/consulting [facility] provider listed and b) the [facility] team listed Log into [email or web address] and use [place]'s universal password to access. If you do not have the password, please contact the hospital operator. Locate the [facility] provider you are looking for under [facility] and page to a number that you can be directly reached. If you still have difficulty reaching the provider, please page the [facility] (Director on Call) for the Hospitalists listed on amion for assistance.  12/30/2023, 4:04 PM

## 2023-12-30 NOTE — ED Notes (Signed)
 In/Out catheter done for urine sample with Camie, RN. Pt had a small amount of blood come out afterwards. Pt denies pain/in no distress. EDP notified.

## 2023-12-30 NOTE — ED Triage Notes (Signed)
 Pt bib [facility] for a code stroke. LKN 2200 last night. C/o facial droop and left sided weakness. Pt has pacemaker. Upon arrival pt alert to self and CBG 66. Hx of similar episodes with hypotension and hypoglycemia.

## 2023-12-30 NOTE — Code Documentation (Signed)
 Stroke Response Nurse Documentation Code Documentation  Dustin Hamilton is a 83 y.o. male arriving to [facility]  via [facility] on 7/30 with past medical hx of CVA, HTN, ICH, TIA, dementia. On No antithrombotic. Code stroke was activated by EMS.   Patient from home where he was LKW at 2200 last night and now complaining of facial droop, L sided weakness, and increased confusion on waking this morning. CBG 66 on arrival to ED. D50 given in CT.   Stroke team at the bedside on patient arrival. Labs drawn and patient cleared for CT by Dr. Doretha. Patient to CT with team. NIHSS 5, see documentation for details and code stroke times. Patient with disoriented, left facial droop, and Expressive aphasia  on exam. The following imaging was completed:  CT Head. Patient is not a candidate for IV Thrombolytic due to LKW 2200 and previous history of ICH. Patient is not a candidate for IR due to LVO not suspected.   Care Plan: q2 NIHSS and vitals.   Bedside handoff with ED RN Milo and Jenna.    Lauraine LITTIE Searle  Stroke Response RN

## 2023-12-30 NOTE — ED Provider Notes (Signed)
 [place] EMERGENCY DEPARTMENT AT [facility] Provider Note   CSN: [id number] Arrival date & time: 12/30/23  1043  An emergency department physician performed an initial assessment on this suspected stroke patient at 1041.  Patient presents with: Code Stroke   Dustin Hamilton is a 83 y.o. male.   Pt is a 83y/o male with history of prior CVA, hypertension, dementia, recent hospitalizations for syncope status post pacemaker with hx of prior amyloid episodes who is presenting today as a code stroke.  Patient went to bed normally and family reported since waking up this morning he had left-sided facial droop, left-sided weakness and was only looking to the right.  EMS initiated code stroke due to LVO positive.  Patient has had prior CVAs but has also had bleeds in the past as well as episodes similar to this which were unclear the cause.  Patient is unable to speak at this moment and no further history is available.  EMS reports a blood sugar of 87 and blood pressure in the 160s systolic.  The history is provided by the EMS personnel and medical records.       Prior to Admission medications   Medication Sig Start Date End Date Taking? Authorizing Provider  allopurinol  (ZYLOPRIM ) 100 MG tablet Take 1 tablet (100 mg total) by mouth daily. 08/07/23 08/06/24  Krishnan, Gokul, MD  amLODipine  (NORVASC ) 10 MG tablet Take 5 mg by mouth daily.    [provider]  aspirin  81 MG chewable tablet Chew 1 tablet (81 mg total) by mouth daily. 03/22/23   Elgergawy, Brayton RAMAN, MD  brimonidine  (ALPHAGAN ) 0.2 % ophthalmic solution Place 1 drop into both eyes 2 (two) times daily. 10/19/22   Sridharan, Sriramkumar, MD  colchicine  0.6 MG tablet Take 1 tablet (0.6 mg total) by mouth daily. 10/02/23   Ghimire, Donalda HERO, MD  dorzolamide  (TRUSOPT ) 2 % ophthalmic solution Place 1 drop into both eyes 2 (two) times daily.    [provider]  dutasteride  (AVODART ) 0.5 MG capsule Take 0.5 mg by  mouth in the morning.    [provider]  latanoprost  (XALATAN ) 0.005 % ophthalmic solution Place 1 drop into both eyes at bedtime.    [provider]  melatonin 3 MG TABS tablet Take 3 mg by mouth at bedtime.    [provider]  memantine  (NAMENDA ) 10 MG tablet Take 10 mg by mouth 2 (two) times daily.    [provider]    Allergies: Penicillins, Levetiracetam , Aricept  [donepezil ], Exelon [rivastigmine], and Viagra [sildenafil]    Review of Systems  Updated Vital Signs BP (!) 163/98   Pulse 71   Temp 97.7 F (36.5 C)   Resp 18   Wt 67 kg   SpO2 99%   BMI 20.60 kg/m   Physical Exam Vitals and nursing note reviewed.  Constitutional:      General: He is not in acute distress.    Appearance: He is well-developed.  HENT:     Head: Normocephalic and atraumatic.  Eyes:     Conjunctiva/sclera: Conjunctivae normal.     Pupils: Pupils are equal, round, and reactive to light.  Cardiovascular:     Rate and Rhythm: Normal rate and regular rhythm.     Pulses: Normal pulses.     Heart sounds: No murmur heard. Pulmonary:     Effort: Pulmonary effort is normal. No respiratory distress.     Breath sounds: Normal breath sounds. No wheezing or rales.  Abdominal:  General: There is no distension.     Palpations: Abdomen is soft.     Tenderness: There is no abdominal tenderness. There is no guarding or rebound.  Musculoskeletal:        General: No tenderness. Normal range of motion.     Cervical back: Normal range of motion and neck supple.  Skin:    General: Skin is warm and dry.     Findings: No erythema or rash.  Neurological:     Mental Status: He is alert.     Cranial Nerves: Facial asymmetry present.     Sensory: Sensation is intact.     Comments: Patient is awake but is not answering questions.  He is slowly able to follow commands.  Having some difficulty with finger-to-nose.  Left-sided facial droop noticed.  Patient has preference to the  right but will cross midline to look to the left.  4-5 strength in the left upper extremity compared to the right.  Psychiatric:        Behavior: Behavior normal.     (all labs ordered are listed, but only abnormal results are displayed) Labs Reviewed  PROTIME-INR - Abnormal; Notable for the following components:      Result Value   Prothrombin Time 16.3 (*)    All other components within normal limits  CBC - Abnormal; Notable for the following components:   RBC 3.69 (*)    Hemoglobin 11.8 (*)    HCT 35.6 (*)    Platelets 92 (*)    All other components within normal limits  COMPREHENSIVE METABOLIC PANEL WITH GFR - Abnormal; Notable for the following components:   Potassium 3.3 (*)    Chloride 117 (*)    Calcium  8.0 (*)    Total Protein 5.6 (*)    Albumin 2.9 (*)    Anion gap 4 (*)    All other components within normal limits  URINALYSIS, W/ REFLEX TO CULTURE (INFECTION SUSPECTED) - Abnormal; Notable for the following components:   Color, Urine STRAW (*)    Hgb urine dipstick MODERATE (*)    Protein, ur 30 (*)    Leukocytes,Ua SMALL (*)    Bacteria, UA RARE (*)    All other components within normal limits  I-STAT CHEM 8, ED - Abnormal; Notable for the following components:   Sodium 150 (*)    Potassium 2.7 (*)    Chloride 117 (*)    Creatinine, Ser 0.60 (*)    Calcium , Ion 0.98 (*)    TCO2 17 (*)    Hemoglobin 12.2 (*)    HCT 36.0 (*)    All other components within normal limits  CBG MONITORING, ED - Abnormal; Notable for the following components:   Glucose-Capillary 66 (*)    All other components within normal limits  CBG MONITORING, ED - Abnormal; Notable for the following components:   Glucose-Capillary 199 (*)    All other components within normal limits  CBG MONITORING, ED - Abnormal; Notable for the following components:   Glucose-Capillary 111 (*)    All other components within normal limits  CBG MONITORING, ED - Abnormal; Notable for the following  components:   Glucose-Capillary 108 (*)    All other components within normal limits  APTT  DIFFERENTIAL  ETHANOL    EKG: EKG Interpretation Date/Time:  Wednesday December 30 2023 11:08:58 EDT Ventricular Rate:  71 PR Interval:  292 QRS Duration:  148 QT Interval:  452 QTC Calculation: 492 R Axis:   215  Text Interpretation: Sinus rhythm Prolonged PR interval Nonspecific intraventricular conduction delay Anterolateral infarct, old No significant change since last tracing Confirmed by Doretha Folks ([id number]) on 12/30/2023 12:06:31 PM  Radiology: CT HEAD CODE STROKE WO CONTRAST` Result Date: 12/30/2023 CLINICAL DATA:  Code stroke. 83 year old male with weakness and leftward gaze. EXAM: CT HEAD WITHOUT CONTRAST TECHNIQUE: Contiguous axial images were obtained from the base of the skull through the vertex without intravenous contrast. RADIATION DOSE REDUCTION: This exam was performed according to the departmental dose-optimization program which includes automated exposure control, adjustment of the mA and/or kV according to patient size and/or use of iterative reconstruction technique. COMPARISON:  Brain MRI 03/17/2023.  Head CT 10/20/2023. FINDINGS: Brain: Stable cerebral volume. Chronic encephalomalacia left PCA territory. Chronic nonspecific ventriculomegaly. Chronic bilateral cerebral white matter hypodensity which is confluent. Chronic heterogeneity in the deep gray nuclei. Stable gray-white matter differentiation throughout the brain. No midline shift, mass effect, evidence of mass lesion, intracranial hemorrhage or evidence of cortically based acute infarction. Vascular: Advanced Calcified atherosclerosis at the skull base. No suspicious intracranial vascular hyperdensity. Skull: Stable and intact. Sinuses/Orbits: Visualized paranasal sinuses and mastoids are stable and well aerated. Other: No gaze deviation at this time. No acute scalp soft tissue findings. ASPECTS [facility] Stroke Program  Early CT Score) Total score (0-10 with 10 being normal): 10 IMPRESSION: 1. No acute cortically based infarct or acute intracranial hemorrhage identified. ASPECTS 10. 2.  Stable non contrast CT appearance of chronic ischemic disease. 3. These results were communicated to Dr. Vanessa at 10:57 am on 12/30/2023 by text page via the [facility] messaging system. Electronically Signed   By: VEAR Hurst M.D.   On: 12/30/2023 10:58     Procedures   Medications Ordered in the ED  sodium chloride  flush (NS) 0.9 % injection 3 mL (3 mLs Intravenous Given 12/30/23 1456)  dextrose  50 % solution 50 mL (50 mLs Intravenous Given 12/30/23 1050)  lactated ringers  bolus 500 mL (500 mLs Intravenous New Bag/Given 12/30/23 1456)                                    Medical Decision Making Amount and/or Complexity of Data Reviewed Independent Historian: spouse and EMS External Data Reviewed: notes. Labs: ordered. Decision-making details documented in ED Course. Radiology: ordered and independent interpretation performed. Decision-making details documented in ED Course.  Risk Prescription drug management.   Pt with multiple medical problems and comorbidities and presenting today with a complaint that caries a high risk for morbidity and mortality.  Here today with the above complaints.  Concern for stroke, bleed, hypoglycemia, metabolic encephalopathy, seizure.  On exam patient does seem to have left-sided deficits with a right gaze preference but will cross midline.  Blood pressure and O2 are stable here with an intact airway.  CBG here shows a blood sugar of 66.  Patient given D50.   I dependently interpreted patient's labs and EKG.  Chem-8 showed a hypokalemia with potassium 2.7 as well as a hypernatremia with a sodium of 150 but normal creatinine of 0.6.  Will confirm with a CMP 3:25 PM CMP with normal sodium and minimally low potassium at 3.3.  Renal function is normal.  UA without significant signs of infection.  On  repeat evaluation patient is still not moving his left side.  Wife is now present in the room and reports patient usually walks is able to feed himself and have  short conversations.  She reports normally he can lift both legs equally.  Discussed with neurology who recommended that patient should have an MRI if his symptoms persist.  He does not need a full stroke workup unless MRI is positive.  Patient does have a pacemaker in will most likely need the MRI tomorrow when the rep can put his pacemaker in MRI mode.  Will consult hospitalist for admission.     Final diagnoses:  Left-sided weakness    ED Discharge Orders     None          Doretha Folks, MD 12/30/23 1525

## 2023-12-30 NOTE — ED Notes (Signed)
Patient being transported to MRI with RN.

## 2023-12-30 NOTE — ED Notes (Signed)
 CCMD is monitoring this pt.

## 2023-12-30 NOTE — ED Notes (Signed)
 Urine occurrence. Pt changed into new gown and brief.

## 2023-12-30 NOTE — Progress Notes (Signed)
  Device system confirmed to be MRI conditional, with implant date > 6 weeks ago, and no evidence of abandoned or epicardial leads in review of most recent CXR  Device last cleared by EP Provider: Elvie Needle NP 12/30/23  Clearance is good through for 1 year as long as parameters remain stable at time of check. If pt undergoes a cardiac device procedure during that time, they should be re-cleared.   Tachy-therapies to be programmed off if applicable with device back to pre-MRI settings after completion of exam.  [employer] was available remotely to assist in programming recommendations.   Emogene Bouchard, [place]  12/30/2023 5:46 PM

## 2023-12-31 ENCOUNTER — Encounter (HOSPITAL_COMMUNITY): Payer: Self-pay | Admitting: Internal Medicine

## 2023-12-31 ENCOUNTER — Observation Stay (HOSPITAL_COMMUNITY)

## 2023-12-31 DIAGNOSIS — R319 Hematuria, unspecified: Secondary | ICD-10-CM | POA: Diagnosis not present

## 2023-12-31 DIAGNOSIS — N179 Acute kidney failure, unspecified: Secondary | ICD-10-CM | POA: Diagnosis not present

## 2023-12-31 DIAGNOSIS — E854 Organ-limited amyloidosis: Secondary | ICD-10-CM | POA: Diagnosis present

## 2023-12-31 DIAGNOSIS — I63532 Cerebral infarction due to unspecified occlusion or stenosis of left posterior cerebral artery: Secondary | ICD-10-CM | POA: Diagnosis present

## 2023-12-31 DIAGNOSIS — F482 Pseudobulbar affect: Secondary | ICD-10-CM

## 2023-12-31 DIAGNOSIS — R1310 Dysphagia, unspecified: Secondary | ICD-10-CM | POA: Diagnosis not present

## 2023-12-31 DIAGNOSIS — R0603 Acute respiratory distress: Secondary | ICD-10-CM | POA: Diagnosis not present

## 2023-12-31 DIAGNOSIS — R531 Weakness: Secondary | ICD-10-CM | POA: Diagnosis present

## 2023-12-31 DIAGNOSIS — I119 Hypertensive heart disease without heart failure: Secondary | ICD-10-CM | POA: Diagnosis present

## 2023-12-31 DIAGNOSIS — N3000 Acute cystitis without hematuria: Secondary | ICD-10-CM | POA: Diagnosis not present

## 2023-12-31 DIAGNOSIS — F0150 Vascular dementia without behavioral disturbance: Secondary | ICD-10-CM | POA: Diagnosis present

## 2023-12-31 DIAGNOSIS — I69391 Dysphagia following cerebral infarction: Secondary | ICD-10-CM

## 2023-12-31 DIAGNOSIS — Z8249 Family history of ischemic heart disease and other diseases of the circulatory system: Secondary | ICD-10-CM | POA: Diagnosis not present

## 2023-12-31 DIAGNOSIS — I4891 Unspecified atrial fibrillation: Secondary | ICD-10-CM | POA: Diagnosis not present

## 2023-12-31 DIAGNOSIS — Y900 Blood alcohol level of less than 20 mg/100 ml: Secondary | ICD-10-CM | POA: Diagnosis present

## 2023-12-31 DIAGNOSIS — I6381 Other cerebral infarction due to occlusion or stenosis of small artery: Secondary | ICD-10-CM | POA: Diagnosis not present

## 2023-12-31 DIAGNOSIS — I4892 Unspecified atrial flutter: Secondary | ICD-10-CM | POA: Diagnosis present

## 2023-12-31 DIAGNOSIS — E782 Mixed hyperlipidemia: Secondary | ICD-10-CM | POA: Diagnosis present

## 2023-12-31 DIAGNOSIS — I69398 Other sequelae of cerebral infarction: Secondary | ICD-10-CM

## 2023-12-31 DIAGNOSIS — F01B0 Vascular dementia, moderate, without behavioral disturbance, psychotic disturbance, mood disturbance, and anxiety: Secondary | ICD-10-CM | POA: Diagnosis not present

## 2023-12-31 DIAGNOSIS — D693 Immune thrombocytopenic purpura: Secondary | ICD-10-CM | POA: Diagnosis not present

## 2023-12-31 DIAGNOSIS — I6340 Cerebral infarction due to embolism of unspecified cerebral artery: Secondary | ICD-10-CM | POA: Diagnosis not present

## 2023-12-31 DIAGNOSIS — Z833 Family history of diabetes mellitus: Secondary | ICD-10-CM | POA: Diagnosis not present

## 2023-12-31 DIAGNOSIS — N138 Other obstructive and reflux uropathy: Secondary | ICD-10-CM | POA: Diagnosis present

## 2023-12-31 DIAGNOSIS — I6389 Other cerebral infarction: Secondary | ICD-10-CM | POA: Diagnosis present

## 2023-12-31 DIAGNOSIS — D696 Thrombocytopenia, unspecified: Secondary | ICD-10-CM | POA: Diagnosis not present

## 2023-12-31 DIAGNOSIS — G8194 Hemiplegia, unspecified affecting left nondominant side: Secondary | ICD-10-CM | POA: Diagnosis not present

## 2023-12-31 DIAGNOSIS — R339 Retention of urine, unspecified: Secondary | ICD-10-CM | POA: Diagnosis not present

## 2023-12-31 DIAGNOSIS — Z1152 Encounter for screening for COVID-19: Secondary | ICD-10-CM | POA: Diagnosis not present

## 2023-12-31 DIAGNOSIS — I959 Hypotension, unspecified: Secondary | ICD-10-CM | POA: Diagnosis not present

## 2023-12-31 DIAGNOSIS — G936 Cerebral edema: Secondary | ICD-10-CM | POA: Diagnosis not present

## 2023-12-31 DIAGNOSIS — I69354 Hemiplegia and hemiparesis following cerebral infarction affecting left non-dominant side: Secondary | ICD-10-CM | POA: Diagnosis not present

## 2023-12-31 DIAGNOSIS — R29818 Other symptoms and signs involving the nervous system: Secondary | ICD-10-CM | POA: Diagnosis not present

## 2023-12-31 DIAGNOSIS — I10 Essential (primary) hypertension: Secondary | ICD-10-CM | POA: Diagnosis not present

## 2023-12-31 DIAGNOSIS — I471 Supraventricular tachycardia, unspecified: Secondary | ICD-10-CM | POA: Diagnosis present

## 2023-12-31 DIAGNOSIS — F0390 Unspecified dementia without behavioral disturbance: Secondary | ICD-10-CM

## 2023-12-31 DIAGNOSIS — E43 Unspecified severe protein-calorie malnutrition: Secondary | ICD-10-CM | POA: Diagnosis present

## 2023-12-31 DIAGNOSIS — R29711 NIHSS score 11: Secondary | ICD-10-CM | POA: Diagnosis not present

## 2023-12-31 DIAGNOSIS — I639 Cerebral infarction, unspecified: Secondary | ICD-10-CM | POA: Diagnosis not present

## 2023-12-31 DIAGNOSIS — I2510 Atherosclerotic heart disease of native coronary artery without angina pectoris: Secondary | ICD-10-CM | POA: Diagnosis present

## 2023-12-31 DIAGNOSIS — R1312 Dysphagia, oropharyngeal phase: Secondary | ICD-10-CM | POA: Diagnosis not present

## 2023-12-31 DIAGNOSIS — R4182 Altered mental status, unspecified: Secondary | ICD-10-CM | POA: Diagnosis not present

## 2023-12-31 DIAGNOSIS — I680 Cerebral amyloid angiopathy: Secondary | ICD-10-CM | POA: Diagnosis present

## 2023-12-31 DIAGNOSIS — Z4682 Encounter for fitting and adjustment of non-vascular catheter: Secondary | ICD-10-CM | POA: Diagnosis not present

## 2023-12-31 DIAGNOSIS — J9811 Atelectasis: Secondary | ICD-10-CM | POA: Diagnosis not present

## 2023-12-31 DIAGNOSIS — G219 Secondary parkinsonism, unspecified: Secondary | ICD-10-CM | POA: Diagnosis present

## 2023-12-31 DIAGNOSIS — K760 Fatty (change of) liver, not elsewhere classified: Secondary | ICD-10-CM | POA: Diagnosis present

## 2023-12-31 DIAGNOSIS — F1011 Alcohol abuse, in remission: Secondary | ICD-10-CM | POA: Diagnosis present

## 2023-12-31 DIAGNOSIS — H409 Unspecified glaucoma: Secondary | ICD-10-CM | POA: Diagnosis present

## 2023-12-31 DIAGNOSIS — R4701 Aphasia: Secondary | ICD-10-CM | POA: Diagnosis present

## 2023-12-31 DIAGNOSIS — Z950 Presence of cardiac pacemaker: Secondary | ICD-10-CM | POA: Diagnosis not present

## 2023-12-31 LAB — COMPREHENSIVE METABOLIC PANEL WITH GFR
ALT: 17 U/L (ref 0–44)
AST: 18 U/L (ref 15–41)
Albumin: 3.8 g/dL (ref 3.5–5.0)
Alkaline Phosphatase: 76 U/L (ref 38–126)
Anion gap: 11 (ref 5–15)
BUN: 14 mg/dL (ref 8–23)
CO2: 23 mmol/L (ref 22–32)
Calcium: 10.1 mg/dL (ref 8.9–10.3)
Chloride: 108 mmol/L (ref 98–111)
Creatinine, Ser: 1.03 mg/dL (ref 0.61–1.24)
GFR, Estimated: >60 mL/min (ref >60)
Glucose, Bld: 100 mg/dL — ABNORMAL HIGH (ref 70–99)
Potassium: 3.6 mmol/L (ref 3.5–5.1)
Sodium: 142 mmol/L (ref 135–145)
Total Bilirubin: 1.4 mg/dL — ABNORMAL HIGH (ref 0.0–1.2)
Total Protein: 7.4 g/dL (ref 6.5–8.1)

## 2023-12-31 LAB — CBC
HCT: 50.8 % (ref 39.0–52.0)
Hemoglobin: 17.3 g/dL — ABNORMAL HIGH (ref 13.0–17.0)
MCH: 31.6 pg (ref 26.0–34.0)
MCHC: 34.1 g/dL (ref 30.0–36.0)
MCV: 92.9 fL (ref 80.0–100.0)
Platelets: 146 K/uL — ABNORMAL LOW (ref 150–400)
RBC: 5.47 MIL/uL (ref 4.22–5.81)
RDW: 13.5 % (ref 11.5–15.5)
WBC: 8.1 K/uL (ref 4.0–10.5)
nRBC: 0 % (ref 0.0–0.2)

## 2023-12-31 LAB — HEMOGLOBIN A1C
Hgb A1c MFr Bld: 5.2 % (ref 4.8–5.6)
Mean Plasma Glucose: 102.54 mg/dL

## 2023-12-31 LAB — LIPID PANEL
Cholesterol: 115 mg/dL (ref 0–200)
HDL: 41 mg/dL (ref >40)
LDL Cholesterol: 64 mg/dL (ref 0–99)
Total CHOL/HDL Ratio: 2.8 ratio
Triglycerides: 51 mg/dL (ref <150)
VLDL: 10 mg/dL (ref 0–40)

## 2023-12-31 LAB — SARS CORONAVIRUS 2 BY RT PCR: SARS Coronavirus 2 by RT PCR: NEGATIVE

## 2023-12-31 LAB — ECHOCARDIOGRAM COMPLETE: Weight: 2363.33 [oz_av]

## 2023-12-31 MED ORDER — CLOPIDOGREL BISULFATE 75 MG PO TABS
75.0000 mg | ORAL_TABLET | Freq: Every day | ORAL | Status: DC
  Administered 2024-01-01 – 2024-01-03 (×3): 75 mg via ORAL
  Filled 2023-12-31 (×4): qty 1

## 2023-12-31 MED ORDER — DEXTROSE-SODIUM CHLORIDE 5-0.9 % IV SOLN: INTRAVENOUS | Status: AC

## 2023-12-31 NOTE — Progress Notes (Signed)
 Inpatient Rehab Admissions Coordinator Note:   Per therapy recommendations patient was screened for CIR candidacy by Reche FORBES Lowers, PT. At this time, pt appears to be a potential candidate for CIR. I will place an order for rehab consult for full assessment, per our protocol.  Please contact me any with questions.SABRA Reche Lowers, PT, DPT [phone] 12/31/23 4:11 PM

## 2023-12-31 NOTE — Progress Notes (Signed)
 PROGRESS NOTE  ASIA FAVATA FMW:[id number] DOB: 1941/01/24 DOA: 12/30/2023 PCP: [facility]   LOS: 0 days   Brief Narrative / Interim history: 83 year old male with HTN, HLD, TIA, prior CVA, amyloid angiopathy, paroxysmal SVT status post ablation, CAD 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  When he woke up on the day of admission had left-sided facial droop, weakness, right gaze deviation and was unable to speak.  At baseline he is confused but walking, able to feed self and have short conversation per his wife  Subjective / 24h Interval events: His eyes are open, has a right gaze preference.  He does not answer any of the questions but does follow commands intermittently.  Assesement and Plan: Principal Problem:   Focal neurological deficit Active Problems:   Essential hypertension   History of TIA (transient ischemic attack) and stroke   Benign prostatic hyperplasia with urinary obstruction   Paroxysmal SVT (supraventricular tachycardia) (HCC)   Complete heart block (HCC)   Dementia (HCC)   Glaucoma   Mixed hyperlipidemia   Obstructive sleep apnea   Secondary parkinsonism, unspecified (HCC)   Status cardiac pacemaker   Principal problem Acute stroke-MRI on admission with acute infarct in the right basal ganglia and remote left PCA territory infarcts, additional small remote infarct in the right occipital cortex within the right PCA territory.  Neurology consulted and followed patient while hospitalized.  CT angiogram with moderate stenosis of the distal M1 segment of the right MCA.  2D echo is pending.  LDL is 67, A1c is 5.5.  Unfortunately he remains nonverbal this morning, pocketing food and did not pass speech this morning.  Will place on fluid, awaiting neurology input and see how he does over the next 24 hours  Active problems HTN-permissive hypertension  History of TIA/prior  CVAs-noted  Paroxysmal SVT-status post ablation  CHB-status post pacemaker  Dementia, parkinsonism-continue home medications  BPH-continue dutasteride   Scheduled Meds:   stroke: early stages of recovery book   Does not apply Once   aspirin   81 mg Oral Daily   clopidogrel   75 mg Oral Daily   dutasteride   0.5 mg Oral Daily   magnesium  oxide  800 mg Oral Once   melatonin  3 mg Oral QHS   memantine   10 mg Oral BID   potassium chloride   40 mEq Oral Once   sodium chloride  flush  3 mL Intravenous Q12H   Continuous Infusions:  dextrose  5 % and 0.9 % NaCl     PRN Meds:.acetaminophen  **OR** acetaminophen , polyethylene glycol  Current Outpatient Medications  Medication Instructions   acetaminophen  (TYLENOL ) 650 mg, Oral, 3 times daily PRN   allopurinol  (ZYLOPRIM ) 100 mg, Oral, Daily   amLODipine  (NORVASC ) 5 mg, Daily   aspirin  81 mg, Oral, Daily   brimonidine  (ALPHAGAN ) 0.2 % ophthalmic solution 1 drop, Both Eyes, 2 times daily   colchicine  0.6 mg, Oral, Daily   dorzolamide  (TRUSOPT ) 2 % ophthalmic solution 1 drop, 2 times daily   dutasteride  (AVODART ) 0.5 mg, Every morning   galantamine (RAZADYNE) 8 mg, Oral, Daily, Takes along with memantine    latanoprost  (XALATAN ) 0.005 % ophthalmic solution 1 drop, Daily at bedtime   melatonin 3 mg, Daily at bedtime   memantine  (NAMENDA ) 10 mg, 2 times daily   tamsulosin  (FLOMAX ) 0.4 mg, Oral, Daily    Diet Orders (From admission, onward)     Start     Ordered   12/30/23 1044  Diet NPO time specified  Diet effective now        12/30/23 1044            DVT prophylaxis: SCDs Start: 12/30/23 1603   Lab Results  Component Value Date   PLT 146 (L) 12/31/2023      Code Status: Full Code  Family Communication: No family at bedside  Status is: Observation The patient will require care spanning > 2 midnights and should be moved to inpatient because: Remains with persistent deficits, n.p.o.   Level of care: Telemetry  Medical  Consultants:  Neurology  Objective: Vitals:   12/30/23 2122 12/30/23 2320 12/31/23 0403 12/31/23 0748  BP: (!) 158/95 (!) 156/96 92/65 138/86  Pulse: 77 71 (!) 59 64  Resp: 16 16 16 19   Temp: 97.7 F (36.5 C) 97.8 F (36.6 C) 99.3 F (37.4 C) 97.7 F (36.5 C)  TempSrc: Oral Oral Oral Oral  SpO2: 99% 97% 96% 94%  Weight:       No intake or output data in the 24 hours ending 12/31/23 0920 Wt Readings from Last 3 Encounters:  12/30/23 67 kg  11/10/23 68.8 kg  10/28/23 68.8 kg    Examination:  Constitutional: NAD Eyes: no scleral icterus ENMT: Mucous membranes are moist.  Neck: normal, supple Respiratory: clear to auscultation bilaterally, no wheezing, no crackles.  Cardiovascular: Regular rate and rhythm, no murmurs / rubs / gallops. No LE edema.  Abdomen: non distended, no tenderness. Bowel sounds positive.  Musculoskeletal: no clubbing / cyanosis.   Data Reviewed: I have independently reviewed following labs and imaging studies   CBC Recent Labs  Lab 12/30/23 1044 12/30/23 1050 12/31/23 0822  WBC 4.7  --  8.1  HGB 11.8* 12.2* 17.3*  HCT 35.6* 36.0* 50.8  PLT 92*  --  146*  MCV 96.5  --  92.9  MCH 32.0  --  31.6  MCHC 33.1  --  34.1  RDW 13.9  --  13.5  LYMPHSABS 0.8  --   --   MONOABS 0.3  --   --   EOSABS 0.1  --   --   BASOSABS 0.0  --   --     Recent Labs  Lab 12/30/23 1044 12/30/23 1050 12/31/23 0351  NA 144 150*  --   K 3.3* 2.7*  --   CL 117* 117*  --   CO2 23  --   --   GLUCOSE 86 71  --   BUN 16 13  --   CREATININE 0.78 0.60*  --   CALCIUM  8.0*  --   --   AST 17  --   --   ALT 14  --   --   ALKPHOS 50  --   --   BILITOT 0.9  --   --   ALBUMIN 2.9*  --   --   MG 1.6*  --   --   INR 1.2  --   --   HGBA1C  --   --  5.2    ------------------------------------------------------------------------------------------------------------------ Recent Labs    12/31/23 0351  CHOL 115  HDL 41  LDLCALC 64  TRIG 51  CHOLHDL 2.8     Lab Results  Component Value Date   HGBA1C 5.2 12/31/2023   ------------------------------------------------------------------------------------------------------------------ No results for input(s): TSH, T4TOTAL, T3FREE, THYROIDAB in the last 72 hours.  Invalid input(s): FREET3  Cardiac Enzymes No results for input(s): CKMB, TROPONINI, MYOGLOBIN in the last 168 hours.  Invalid input(s):  CK ------------------------------------------------------------------------------------------------------------------    Component Value Date/Time   BNP 106.2 (H) 03/20/2023 0312    CBG: Recent Labs  Lab 12/30/23 1045 12/30/23 1108 12/30/23 1256 12/30/23 1301  GLUCAP 66* 199* 111* 108*    Recent Results (from the past 240 hours)  SARS Coronavirus 2 by RT PCR (hospital order, performed in [facility] hospital lab) *cepheid single result test*     Status: None   Collection Time: 12/30/23 11:00 PM  Result Value Ref Range Status   SARS Coronavirus 2 by RT PCR NEGATIVE NEGATIVE Final    Comment: Performed at [facility], [address] [place], [place] [address]     Radiology Studies: MR BRAIN WO CONTRAST Result Date: 12/30/2023 EXAM: MRI BRAIN WITHOUT CONTRAST 12/30/2023 05:45:53 PM TECHNIQUE: Multiplanar multisequence MRI of the head/brain was performed without the administration of intravenous contrast. COMPARISON: CT head and CTA head and neck dated 12/30/2023. MRI head dated 12/30/2023. CLINICAL HISTORY: Neuro deficit, acute, stroke suspected. Stroke rule out. FINDINGS: BRAIN AND VENTRICLES: Remote left PCA (posterior cerebral artery) territory infarcts. There are nonspecific hyperintense foci in the subcortical and periventricular white matter that most likely represent chronic microangiopathic ischemic changes in a patient of this age. Moderate generalized parenchymal volume loss. Small remote infarct in the left centrum semiovale. There are numerous scattered  chronic micro hemorrhages in the cerebral hemispheres particularly within the temporal and occipital lobes with additional chronic micro hemorrhages in the left dorsal midbrain as well as within the bilateral cerebellum. There is restricted diffusion within the right basal ganglia primarily involving the superior aspect of the lentiform nucleus as well as the right caudate head extending into the body of the right caudate nucleus. Additional small remote infarcts in the right occipital cortex within the right PCA territory. ORBITS: No acute abnormality. SINUSES AND MASTOIDS: Bilateral mastoid effusions. BONES AND SOFT TISSUES: Normal marrow signal. No acute soft tissue abnormality. IMPRESSION: 1. Acute infarct in the right basal ganglia. 2. Remote left PCA territory infarcts and additional small remote infarcts in the right occipital cortex within the right PCA territory. 3. 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 4. Moderate generalized parenchymal volume loss. Electronically signed by: Donnice Mania MD 12/30/2023 06:56 PM EDT RP Workstation: [id number]   CT ANGIO HEAD NECK W WO CM Result Date: 12/30/2023 EXAM: CTA HEAD AND NECK WITH AND WITHOUT 12/30/2023 04:41:29 PM TECHNIQUE: CTA of the head and neck was performed with and without the administration of intravenous contrast. Multiplanar 2D and/or 3D reformatted images are provided for review. Automated exposure control, iterative reconstruction, and/or weight based adjustment of the mA/kV was utilized to reduce the radiation dose to as low as reasonably achievable. Stenosis of the internal carotid arteries measured using NASCET criteria. COMPARISON: 12/15/2022 CLINICAL HISTORY: Neuro deficit, acute, stroke suspected. FINDINGS: CTA NECK: AORTIC ARCH AND ARCH VESSELS: No dissection or arterial injury. No significant stenosis of the brachiocephalic or subclavian arteries. CERVICAL CAROTID ARTERIES: Mild  atherosclerosis at the right carotid bifurcation without hemodynamically significant stenosis. Additional mild atherosclerosis at the left carotid bifurcation without hemodynamically significant stenosis. CERVICAL VERTEBRAL ARTERIES: Tortuosity of the right V1 segment. Also tortuosity of the left V1 segment and the bilateral V2 segments. Atherosclerosis of the bilateral V4 segments resulting in focal moderate stenosis of the V4 segment right vertebral artery. LUNGS AND MEDIASTINUM: Unremarkable. SOFT TISSUES: Multiple subcentimeter thyroid  nodules. No cervical lymphadenopathy. BONES: No acute or aggressive finding of the osseous structures. 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 CTA HEAD: ANTERIOR CIRCULATION: No significant stenosis of the internal carotid arteries. The ACAs are patent bilaterally. There is focal moderate stenosis of the distal right M1 segment which is new from prior. The MCAs are otherwise patent and normal in caliber. POSTERIOR CIRCULATION: Redemonstrated occlusion of the proximal P2 segment of the left PCA with reconstitution at the mid P2 segment. The PCAs are otherwise patent bilaterally. No significant stenosis of the basilar artery. No aneurysm. OTHER: No dural venous sinus thrombosis on this non-dedicated study. Mild atherosclerosis of the carotid siphons without stenosis. IMPRESSION: 1. New focal moderate stenosis of the distal M1 segment of the right MCA. 2. Redemonstrated occlusion of the proximal P2 segment of the left PCA with reconstitution at the mid P2 segment. 3. Focal moderate stenosis of the V4 segment right vertebral artery. 4. Mild atherosclerosis at the right and left carotid bifurcations without hemodynamically significant stenosis. Electronically signed by: Donnice Mania MD 12/30/2023 05:03 PM EDT RP Workstation: [id number]   CT HEAD CODE STROKE WO  CONTRAST` Result Date: 12/30/2023 CLINICAL DATA:  Code stroke. 83 year old male with weakness and leftward gaze. EXAM: CT HEAD WITHOUT CONTRAST TECHNIQUE: Contiguous axial images were obtained from the base of the skull through the vertex without intravenous contrast. RADIATION DOSE REDUCTION: This exam was performed according to the departmental dose-optimization program which includes automated exposure control, adjustment of the mA and/or kV according to patient size and/or use of iterative reconstruction technique. COMPARISON:  Brain MRI 03/17/2023.  Head CT 10/20/2023. FINDINGS: Brain: Stable cerebral volume. Chronic encephalomalacia left PCA territory. Chronic nonspecific ventriculomegaly. Chronic bilateral cerebral white matter hypodensity which is confluent. Chronic heterogeneity in the deep gray nuclei. Stable gray-white matter differentiation throughout the brain. No midline shift, mass effect, evidence of mass lesion, intracranial hemorrhage or evidence of cortically based acute infarction. Vascular: Advanced Calcified atherosclerosis at the skull base. No suspicious intracranial vascular hyperdensity. Skull: Stable and intact. Sinuses/Orbits: Visualized paranasal sinuses and mastoids are stable and well aerated. Other: No gaze deviation at this time. No acute scalp soft tissue findings. ASPECTS [facility] Stroke Program Early CT Score) Total score (0-10 with 10 being normal): 10 IMPRESSION: 1. No acute cortically based infarct or acute intracranial hemorrhage identified. ASPECTS 10. 2.  Stable non contrast CT appearance of chronic ischemic disease. 3. These results were communicated to Dr. Vanessa at 10:57 am on 12/30/2023 by text page via the [facility] messaging system. Electronically Signed   By: VEAR Hurst M.D.   On: 12/30/2023 10:58     Nilda Fendt, MD, PhD [facility]  Between 7 am - 7 pm I am available, please contact me via Amion (for emergencies) or Securechat (non urgent  messages)  Between 7 pm - 7 am I am not available, please contact night coverage MD/APP via Amion

## 2023-12-31 NOTE — TOC CAGE-AID Note (Signed)
 Transition of Care [facility]) - CAGE-AID Screening   Patient Details  Name: Dustin Hamilton MRN: [id number] Date of Birth: [birth year]  [facility]) CM/SW Contact:    Raylee Strehl E Shahir Karen, LCSW Phone Number: 12/31/2023, 8:58 AM   Clinical Narrative:    CAGE-AID Screening: Substance Abuse Screening unable to be completed due to: : Patient unable to participate

## 2023-12-31 NOTE — Care Management Obs Status (Signed)
 MEDICARE OBSERVATION STATUS NOTIFICATION   Patient Details  Name: Dustin Hamilton MRN: [id number] Date of Birth: [birth year]   Medicare Observation Status Notification Given:  Yes  LETTER SIGNED AND COPY GIVEN  Harutyun Monteverde 12/31/2023, 2:14 PM

## 2023-12-31 NOTE — TOC Initial Note (Signed)
 Transition of Care [facility]) - Initial/Assessment Note    Patient Details  Name: Dustin Hamilton MRN: [id number] Date of Birth: 06-08-1940  Transition of Care [facility]) CM/SW Contact:    Andrez JULIANNA George, RN Phone Number: 12/31/2023, 1:57 PM  Clinical Narrative:                  Pt is from home with his spouse. They are together most of the time.  DME at home: CPAP/ cane/ BSC/ shower seat/ walker/ wheelchair Wife manages his medications.  Wife provides needed transportation.  Current recommendations are for CIR. Awaiting work up. IP Care management following.  Expected Discharge Plan: IP Rehab Facility Barriers to Discharge: Continued Medical Work up   Patient Goals and CMS Choice   CMS Medicare.gov Compare Post Acute Care list provided to:: Patient Choice offered to / list presented to : Patient, Spouse      Expected Discharge Plan and Services   Discharge Planning Services: CM Consult Post Acute Care Choice: IP Rehab Living arrangements for the past 2 months: Single Family Home                                      Prior Living Arrangements/Services Living arrangements for the past 2 months: Single Family Home Lives with:: Spouse Patient language and need for interpreter reviewed:: Yes Do you feel safe going back to the place where you live?: Yes        Care giver support system in place?: Yes (comment)   Criminal Activity/Legal Involvement Pertinent to Current Situation/Hospitalization: No - Comment as needed  Activities of Daily Living      Permission Sought/Granted                  Emotional Assessment Appearance:: Appears stated age   Affect (typically observed): Quiet     Psych Involvement: No (comment)  Admission diagnosis:  Left-sided weakness [R53.1] Focal neurological deficit [R29.818] Patient Active Problem List   Diagnosis Date Noted   Depression, unspecified 12/30/2023   Focal neurological deficit 12/30/2023   Hypoglycemia  03/15/2023   Glaucoma 03/15/2023   Gout 03/15/2023   Hypotension 10/19/2022   Dementia (HCC) 07/02/2022   Generalized weakness 07/02/2022   Chronic low back pain 02/06/2022   Mixed hyperlipidemia 02/06/2022   Obstructive sleep apnea 02/06/2022   Secondary parkinsonism, unspecified (HCC) 02/06/2022   Status cardiac pacemaker 02/06/2022   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   Fever and chills 04/01/2021   Complete heart block (HCC)    Heart block    Syncope 05/03/2020   Thrombocytopenia (HCC) 05/03/2020   Atypical chest pain 01/27/2020   Paroxysmal SVT (supraventricular tachycardia) (HCC) 06/03/2017   Dizziness 08/02/2014   Vertigo 09/12/2012   BACK PAIN 06/20/2009   COLONIC POLYPS 05/06/2007   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   NONDEPENDENT ALCOHOL  ABUSE IN REMISSION 05/06/2007   Essential hypertension 05/06/2007   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 [place] [address] Phone: [phone] Fax: [phone]  [facility] - [place], [place] - [address] [place] [address] Phone: [phone] Fax: [phone]  [facility] [address] [place] [address] Phone: [phone] Fax: [phone]     Social Drivers of Health (SDOH) Social History: SDOH Screenings   Food  Insecurity: Patient Unable To Answer (12/31/2023)  Housing: Patient Unable To Answer (12/31/2023)  Transportation Needs: Patient Unable To Answer (12/31/2023)  Utilities: Patient Unable To Answer (12/31/2023)  Social Connections: Patient Unable To Answer (12/31/2023)  Tobacco Use: Medium Risk (10/28/2023)   SDOH Interventions:     Readmission Risk Interventions     No data to display

## 2023-12-31 NOTE — Evaluation (Addendum)
 Physical Therapy Evaluation Patient Details Name: Dustin Hamilton MRN: [id number] DOB: 09-Oct-1940 Today's Date: 12/31/2023  History of Present Illness  Pt is an 83 yo male presenting to [facility] on 12/30/19 with L sided deficits as a code stroke. MRI demonstrated acute Right Basal ganglia infarcts and remote left PCA and R occipital cortex infarcts. PMH of  hypertension, hyperlipidemia, TIA, CVA, amyloid angiopathy, paroxysmal SVT status post ablation, complete heart block status post pacemaker, secondary parkinsonism, dementia, depression, OSA, gout, BPH, glaucoma   Clinical Impression  Pt in bed upon arrival and agreeable to PT eval. PTA, pt was able to ambulate with rollator and had assist from family for bathing and dressing as needed. Pt presents with L sided weakness, impaired static and dynamic balance, impaired communication and decreased activity tolerance. In today's session, pt required TotalAx2 for bed mobility and ModA to CGA for seated balance. Pt tends to lean to the left and anteriorly with fatigue. MaxAx2 to stand with [facility] and MaxAx2 with the Stedy to transfer to the recliner. Assist needed to reach for [facility] handle with pt able to grip and assist with pulling. Pt was nonverbal during session, however, able to nod and give thumbs up in response to questions. Noted R gaze preference with pt able to track to midline. Pt has 24/7 level of assist available at home. Recommending post-acute rehab >3hrs to maximize rehab potential. Pt would benefit from acute skilled PT with current functional limitations listed below (see PT Problem List). Acute PT to follow.         If plan is discharge home, recommend the following: A lot of help with walking and/or transfers;A lot of help with bathing/dressing/bathroom;Assistance with cooking/housework;Direct supervision/assist for medications management;Direct supervision/assist for financial management;Assist for transportation;Help with stairs or ramp for  entrance   Can travel by private vehicle    No    Equipment Recommendations  (TBD with progress)  Recommendations for Other Services  Rehab consult    Functional Status Assessment Patient has had a recent decline in their functional status and demonstrates the ability to make significant improvements in function in a reasonable and predictable amount of time.     Precautions / Restrictions Precautions Precautions: Fall Recall of Precautions/Restrictions: Impaired Restrictions Weight Bearing Restrictions Per Provider Order: No      Mobility  Bed Mobility Overal bed mobility: Needs Assistance    Supine to sit: Total assist, +2 for safety/equipment, +2 for physical assistance, Used rails    General bed mobility comments: cues and no initiation of bed mobility.    Transfers Overall transfer level: Needs assistance Equipment used: 2 person hand held assist, Ambulation equipment used Transfers: Sit to/from Stand, Bed to chair/wheelchair/BSC Sit to Stand: Max assist, +2 physical assistance, +2 safety/equipment Stand pivot transfers: Max assist, +2 physical assistance, +2 safety/equipment    General transfer comment: able to stand with MaxAx2 and [facility] with difficulty maintaining upright posture. Transfer to recliner with Stedy, assist needed to bring hands to stedy handle. Able to slightly pull into standing with MaxAx2 to complete. Slight left lateral lean Transfer via Lift Equipment: Stedy  Ambulation/Gait    General Gait Details: unable this date  Modified Rankin (Stroke Patients Only) Modified Rankin (Stroke Patients Only) Pre-Morbid Rankin Score: Moderately severe disability Modified Rankin: Severe disability     Balance Overall balance assessment: Needs assistance Sitting-balance support: Feet supported Sitting balance-Leahy Scale: Poor Sitting balance - Comments: at some periods CGA, began leaning L and anteriorly with fatigue Postural control:  Left lateral  lean   Standing balance-Leahy Scale: Zero Standing balance comment: reliant on ext assist.          Pertinent Vitals/Pain Pain Assessment Pain Assessment: Faces Faces Pain Scale: Hurts a little bit Pain Location: L abdomen Pain Descriptors / Indicators: Discomfort, Grimacing Pain Intervention(s): Limited activity within patient's tolerance, Monitored during session, Repositioned    Home Living Family/patient expects to be discharged to:: Private residence Living Arrangements: Spouse/significant other Available Help at Discharge: Family;Available 24 hours/day Type of Home: House Home Access: Ramped entrance      Home Layout: One level Home Equipment: Grab bars - toilet;Grab bars - tub/shower;BSC/3in1;Hand held shower head;Cane - single point;Rollator (4 wheels) Additional Comments: Aide x3 hrs 4day/wk. Pt with limited communication, PLOF and setup from past notes    Prior Function Prior Level of Function : Needs assist    Mobility Comments: uses rollator for mobility ADLs Comments: assist with bathing tasks, wears briefs at baseline. pt reports wife will assist with dressing as needed. Aide/family assist with IADLs     Extremity/Trunk Assessment   Upper Extremity Assessment Upper Extremity Assessment: Defer to OT evaluation RUE Deficits / Details: limited mobility, fair grasp able to maintain on stedy. Elbow flexion/ext limited with shoulder movement very slight ROM ~10 deg. LUE Deficits / Details: very limited grasp with LUE, weaker than R and inconsistent. trace elbow flexion no elbow ext, strong flexor synergy pattern. Able to retract scapula, scapula tight with notable protracted position. 3/4 elbow flex tone LUE Sensation: WNL (per report) LUE Coordination: decreased fine motor;decreased gross motor    Lower Extremity Assessment Lower Extremity Assessment: RLE deficits/detail;LLE deficits/detail RLE Deficits / Details: At least 3/5, unable to formally MMT due to  cognition LLE Deficits / Details: Able to wiggle toes and contract L quad, WFL PROM    Cervical / Trunk Assessment Cervical / Trunk Assessment: Kyphotic  Communication   Communication Communication: Impaired Factors Affecting Communication: Difficulty expressing self    Cognition Arousal: Alert Behavior During Therapy: Flat affect   PT - Cognitive impairments: History of cognitive impairments, Difficult to assess Difficult to assess due to: Impaired communication    PT - Cognition Comments: nonverbal with pt occasionally nodding and using thumbs up to communicate Following commands: Impaired Following commands impaired: Follows one step commands with increased time, Only follows one step commands consistently     Cueing Cueing Techniques: Verbal cues, Gestural cues, Tactile cues, Visual cues      PT Assessment Patient needs continued PT services  PT Problem List Decreased strength;Decreased range of motion;Decreased balance;Decreased activity tolerance;Decreased mobility;Decreased coordination;Decreased cognition;Decreased knowledge of use of DME;Decreased safety awareness       PT Treatment Interventions DME instruction;Gait training;Functional mobility training;Therapeutic activities;Therapeutic exercise;Neuromuscular re-education;Balance training;Patient/family education    PT Goals (Current goals can be found in the Care Plan section)  Acute Rehab PT Goals Patient Stated Goal: unable to state goal PT Goal Formulation: Patient unable to participate in goal setting Time For Goal Achievement: 01/14/24 Potential to Achieve Goals: Good    Frequency Min 2X/week     Co-evaluation PT/OT/SLP Co-Evaluation/Treatment: Yes Reason for Co-Treatment: Complexity of the patient's impairments (multi-system involvement);Necessary to address cognition/behavior during functional activity;For patient/therapist safety;To address functional/ADL transfers PT goals addressed during session:  Mobility/safety with mobility;Balance;Proper use of DME OT goals addressed during session: ADL's and self-care;Strengthening/ROM       AM-PAC PT 6 Clicks Mobility  Outcome Measure Help needed turning from your back to your side while in  a flat bed without using bedrails?: Total Help needed moving from lying on your back to sitting on the side of a flat bed without using bedrails?: Total Help needed moving to and from a bed to a chair (including a wheelchair)?: Total Help needed standing up from a chair using your arms (e.g., wheelchair or bedside chair)?: Total Help needed to walk in hospital room?: Total Help needed climbing 3-5 steps with a railing? : Total 6 Click Score: 6    End of Session Equipment Utilized During Treatment: Gait belt Activity Tolerance: Patient tolerated treatment well Patient left: in chair;with call bell/phone within reach;with chair alarm set Nurse Communication: Mobility status;Need for lift equipment PT Visit Diagnosis: Unsteadiness on feet (R26.81);Other abnormalities of gait and mobility (R26.89);Muscle weakness (generalized) (M62.81);Hemiplegia and hemiparesis Hemiplegia - Right/Left: Left Hemiplegia - caused by: Cerebral infarction    Time: 1011-1044 PT Time Calculation (min) (ACUTE ONLY): 33 min   Charges:   PT Evaluation $PT Eval Moderate Complexity: 1 Mod   PT General Charges $$ ACUTE PT VISIT: 1 Visit       Kate ORN, PT, DPT Secure Chat Preferred  Rehab Office [phone]   Kate BRAVO Wendolyn 12/31/2023, 11:44 AM

## 2023-12-31 NOTE — Evaluation (Signed)
 Occupational Therapy Evaluation Patient Details Name: Dustin Hamilton MRN: [id number] DOB: 1940/11/19 Today's Date: 12/31/2023   History of Present Illness   Pt is an 83 yo male presenting to [facility] on 12/30/19 with L sided deficits as a code stroke. MRI demonstrated acute Right Basal ganglia infarcts and remote left PCA and R occipital cortex infarcts. PMH of  hypertension, hyperlipidemia, TIA, CVA, amyloid angiopathy, paroxysmal SVT status post ablation, complete heart block status post pacemaker, secondary parkinsonism, dementia, depression, OSA, gout, BPH, glaucoma     Clinical Impressions Pt admitted for above, prior notes indicate that pt was living with family and ambulating mod I with rollator but family/aides would assist with ADLs/iADLs. Pt currently presenting with listed deficits, noted limited ROM/strength in BUEs with LUE being more impacted than R. Pt needing max A to total A for ADLs, MaxA +2 to transfer, and limited communication skills at this time. OT to continue following pt acutely to address deficits and progress pt as able. Recommend CIR as pt has good home support from family and with intensive therapy may be able to progress to a point where they can provide adequate level of care.      If plan is discharge home, recommend the following:   Two people to help with walking and/or transfers;A lot of help with bathing/dressing/bathroom;Assistance with cooking/housework;Two people to help with bathing/dressing/bathroom;Direct supervision/assist for financial management;Assist for transportation;Direct supervision/assist for medications management;Supervision due to cognitive status     Functional Status Assessment   Patient has had a recent decline in their functional status and demonstrates the ability to make significant improvements in function in a reasonable and predictable amount of time.     Equipment Recommendations   Hospital bed;Hoyer lift;Wheelchair  (measurements OT);BSC/3in1;Tub/shower bench     Recommendations for Other Services   Rehab consult     Precautions/Restrictions   Precautions Precautions: Fall Restrictions Weight Bearing Restrictions Per Provider Order: No     Mobility Bed Mobility Overal bed mobility: Needs Assistance Bed Mobility: Supine to Sit     Supine to sit: Total assist, +2 for safety/equipment, +2 for physical assistance, Used rails     General bed mobility comments: cues and no initiation of bed mobility.    Transfers Overall transfer level: Needs assistance Equipment used: 2 person hand held assist Transfers: Sit to/from Stand, Bed to chair/wheelchair/BSC Sit to Stand: +2 physical assistance, +2 safety/equipment, Max assist Stand pivot transfers: +2 safety/equipment, Max assist, +2 physical assistance                Balance Overall balance assessment: Needs assistance Sitting-balance support: Feet supported Sitting balance-Leahy Scale: Poor Sitting balance - Comments: at some periods CGA, began leaning L and anteriorly with fatigue Postural control: Left lateral lean   Standing balance-Leahy Scale: Zero Standing balance comment: reliant on ext assist.                           ADL either performed or assessed with clinical judgement   ADL       Grooming: Maximal assistance;Sitting Grooming Details (indicate cue type and reason): max A for thoroughness, initiated with hand over hand assist and placement of cloth in hand. Able to bring washcloth to face with light AAROM using RUE.                               General ADL Comments: Total  A for all other ADLs.     Vision   Vision Assessment?: Vision impaired- to be further tested in functional context;Yes Eye Alignment: Within Functional Limits Ocular Range of Motion: Impaired-to be further tested in functional context;Other (comment) (does not go beyond midline) Alignment/Gaze Preference: Chin  down Tracking/Visual Pursuits: Impaired - to be further tested in functional context (Tracks R but does not go beyond midline) Additional Comments: Pt noted to have Lt inattention unable to assess field cut given limited communication as well.     Perception Perception: Impaired Preception Impairment Details: Inattention/Neglect Perception-Other Comments: Left. Possible R/L discrimination.   Praxis Praxis: Impaired Praxis Impairment Details: Motor planning, Organization     Pertinent Vitals/Pain Pain Assessment Pain Assessment: No/denies pain     Extremity/Trunk Assessment Upper Extremity Assessment Upper Extremity Assessment: Difficult to assess due to impaired cognition;RUE deficits/detail;LUE deficits/detail RUE Deficits / Details: limited mobility, fair grasp able to maintain on stedy. Elbow flexion/ext limited with shoulder movement very slight ROM ~10 deg LUE Deficits / Details: very limited grasp with LUE, weaker than R and inconsistent. trace elbow flexion no elbow ext, strong flexor synergy pattern. Able to retract scapula, scapula tight with notable protracted position. LUE Sensation: WNL (per report) LUE Coordination: decreased fine motor;decreased gross motor           Communication Communication Communication: Impaired Factors Affecting Communication: Difficulty expressing self   Cognition Arousal: Alert Behavior During Therapy: Flat affect Cognition: Difficult to assess, Cognition impaired Difficult to assess due to: Impaired communication       Attention impairment (select first level of impairment): Sustained attention, Focused attention   OT - Cognition Comments: Pt with gestural communications, slow to respond but will with head nods or use of fingers.                 Following commands: Impaired Following commands impaired: Follows one step commands with increased time, Only follows one step commands consistently     Cueing  General  Comments   Cueing Techniques: Verbal cues;Gestural cues;Tactile cues;Visual cues      Exercises Other Exercises Other Exercises: Seated scapular mobilization LUE.   Shoulder Instructions      Home Living Family/patient expects to be discharged to:: Private residence Living Arrangements: Spouse/significant other Available Help at Discharge: Family;Available 24 hours/day Type of Home: House Home Access: Ramped entrance     Home Layout: One level     Bathroom Shower/Tub: Producer, television/film/video:  (comfort) Bathroom Accessibility: Yes   Home Equipment: Grab bars - toilet;Grab bars - tub/shower;BSC/3in1;Hand held shower head;Cane - single point;Rollator (4 wheels)   Additional Comments: Aide x3 hrs 4day/wk. Pt with limited communication, PLOF and setup from past notes      Prior Functioning/Environment Prior Level of Function : Needs assist             Mobility Comments: uses rollator for mobility ADLs Comments: assist with bathing tasks, wears briefs at baseline. pt reports wife will assist with dressing as needed. Aide/family assist with IADLs    OT Problem List: Decreased strength;Decreased range of motion;Impaired UE functional use;Impaired balance (sitting and/or standing);Decreased activity tolerance;Decreased safety awareness;Decreased cognition;Impaired tone   OT Treatment/Interventions:        OT Goals(Current goals can be found in the care plan section)   Acute Rehab OT Goals OT Goal Formulation: Patient unable to participate in goal setting Time For Goal Achievement: 01/14/24 Potential to Achieve Goals: Fair ADL Goals Pt Will Perform Grooming:  sitting;with min assist Pt Will Perform Lower Body Bathing: sitting/lateral leans;sit to/from stand;with min assist Pt Will Perform Upper Body Dressing: sitting;with min assist Pt Will Perform Lower Body Dressing: sitting/lateral leans;sit to/from stand;with mod assist Pt Will Transfer to Toilet: bedside  commode;stand pivot transfer;with +2 assist;with min assist Additional ADL Goal #1: Pt will locate 4/4 objects on L side to demonstrate improving attnetion to affected side   OT Frequency:       Co-evaluation              AM-PAC OT 6 Clicks Daily Activity     Outcome Measure Help from another person eating meals?: Total Help from another person taking care of personal grooming?: A Lot Help from another person toileting, which includes using toliet, bedpan, or urinal?: Total Help from another person bathing (including washing, rinsing, drying)?: Total Help from another person to put on and taking off regular upper body clothing?: Total Help from another person to put on and taking off regular lower body clothing?: Total 6 Click Score: 7   End of Session Equipment Utilized During Treatment: Gait belt Nurse Communication: Mobility status  Activity Tolerance: Patient tolerated treatment well Patient left: in chair;with call bell/phone within reach;with chair alarm set  OT Visit Diagnosis: Other abnormalities of gait and mobility (R26.89);Unsteadiness on feet (R26.81);Other symptoms and signs involving cognitive function;Hemiplegia and hemiparesis Hemiplegia - Right/Left: Left Hemiplegia - caused by: Cerebral infarction                Time: 1010-1044 OT Time Calculation (min): 34 min Charges:  OT General Charges $OT Visit: 1 Visit OT Evaluation $OT Eval Moderate Complexity: 1 Mod  12/31/2023  AB, OTR/L  Acute Rehabilitation Services  Office: [phone]   Curtistine JONETTA Das 12/31/2023, 11:32 AM

## 2023-12-31 NOTE — Progress Notes (Addendum)
 STROKE TEAM PROGRESS NOTE   INTERIM HISTORY/SUBJECTIVE Patient presented with sudden onset of decreased responsiveness, left-sided weakness and facial droop.  MRI scan shows a large 3 cm right basal ganglia infarct.  CT angiogram shows focal moderate right M1 stenosis with preserved distal flow.  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 Patient is seen in room, pseudobulbar affect. Increased tone in all extremities with drift on the right.  He is mute and not speaking but able to follow simple commands. Start DAPT today, PPM interrogation pending   OBJECTIVE  CBC    Component Value Date/Time   WBC 8.1 12/31/2023 0822   RBC 5.47 12/31/2023 0822   HGB 17.3 (H) 12/31/2023 0822   HGB 17.2 11/12/2018 0950   HCT 50.8 12/31/2023 0822   HCT 52.0 (H) 11/12/2018 0950   PLT 146 (L) 12/31/2023 0822   PLT 144 (L) 11/12/2018 0950   MCV 92.9 12/31/2023 0822   MCV 92 11/12/2018 0950   MCH 31.6 12/31/2023 0822   MCHC 34.1 12/31/2023 0822   RDW 13.5 12/31/2023 0822   RDW 13.7 11/12/2018 0950   LYMPHSABS 0.8 12/30/2023 1044   LYMPHSABS 1.3 11/12/2018 0950   MONOABS 0.3 12/30/2023 1044   EOSABS 0.1 12/30/2023 1044   EOSABS 0.1 11/12/2018 0950   BASOSABS 0.0 12/30/2023 1044   BASOSABS 0.0 11/12/2018 0950    BMET    Component Value Date/Time   NA 142 12/31/2023 0822   NA 143 11/12/2018 0950   K 3.6 12/31/2023 0822   CL 108 12/31/2023 0822   CO2 23 12/31/2023 0822   GLUCOSE 100 (H) 12/31/2023 0822   BUN 14 12/31/2023 0822   BUN 15 11/12/2018 0950   CREATININE 1.03 12/31/2023 0822   CALCIUM  10.1 12/31/2023 0822   GFRNONAA >60 12/31/2023 0822    IMAGING past 24 hours ECHOCARDIOGRAM COMPLETE Result Date: 12/31/2023    ECHOCARDIOGRAM REPORT   Patient Name:   Dustin Hamilton Date of Exam: 12/31/2023 Medical Rec #:  [id number]          Height:       71.0 in Accession #:    [id number]          Weight:       147.7 lb Date of Birth:  1940-06-08          BSA:          1.854 m Patient Age:    83 years           BP:           138/86 mmHg Patient Gender: M                  HR:           60 bpm. Exam Location:  Inpatient Procedure: 2D Echo, Cardiac Doppler and Color Doppler (Both Spectral and Color            Flow Doppler were utilized during procedure). Indications:    Stroke  History:        Patient has prior history of Echocardiogram examinations, most                 recent 08/07/2023. Risk Factors:Hypertension.  Sonographer:    Philomena Daring Referring Phys: [id number] MARSA NOVAK MELVIN  Sonographer Comments: Technically challenging study due to limited acoustic windows, Technically difficult study due to poor echo windows, suboptimal apical window  and no subcostal window. Image acquisition challenging due to patient body habitus. IMPRESSIONS  1. Left ventricular ejection fraction, by estimation, is 55 to 60%. The left ventricle has normal function. Left ventricular endocardial border not optimally defined to evaluate regional wall motion. Left ventricular diastolic function could not be evaluated.  2. Right ventricular systolic function is normal. The right ventricular size is normal.  3. The mitral valve was not well visualized. No evidence of mitral valve regurgitation. No evidence of mitral stenosis.  4. The aortic valve is tricuspid. There is moderate calcification of the aortic valve. Aortic valve regurgitation is not visualized. Aortic valve sclerosis/calcification is present, without any evidence of aortic stenosis. Conclusion(s)/Recommendation(s): Very limited echo due to pood sound wave transmission. LV never really seen well but in one image we have EF seems ok. FINDINGS  Left Ventricle: Left ventricular ejection fraction, by estimation, is 55 to 60%. The left ventricle has normal function. Left ventricular endocardial border not optimally defined to evaluate regional wall motion. The left  ventricular internal cavity size was normal in size. There is no left ventricular hypertrophy. Left ventricular diastolic function could not be evaluated. Right Ventricle: The right ventricular size is normal. No increase in right ventricular wall thickness. Right ventricular systolic function is normal. Left Atrium: Left atrial size was not well visualized. Right Atrium: Right atrial size was not well visualized. Pericardium: There is no evidence of pericardial effusion. Mitral Valve: The mitral valve was not well visualized. No evidence of mitral valve regurgitation. No evidence of mitral valve stenosis. Tricuspid Valve: The tricuspid valve is normal in structure. Tricuspid valve regurgitation is trivial. No evidence of tricuspid stenosis. Aortic Valve: The aortic valve is tricuspid. There is moderate calcification of the aortic valve. Aortic valve regurgitation is not visualized. Aortic valve sclerosis/calcification is present, without any evidence of aortic stenosis. Pulmonic Valve: The pulmonic valve was not assessed. Pulmonic valve regurgitation is not visualized. No evidence of pulmonic stenosis. Aorta: The aortic root is normal in size and structure. Venous: The inferior vena cava was not well visualized. IAS/Shunts: No atrial level shunt detected by color flow Doppler.  LEFT VENTRICLE PLAX 2D LVOT diam:     2.06 cm LVOT Area:     3.33 cm   AORTA Ao Root diam: 3.03 cm  SHUNTS Systemic Diam: 2.06 cm Toribio Fuel MD Electronically signed by Toribio Fuel MD Signature Date/Time: 12/31/2023/10:08:09 AM    Final    MR BRAIN WO CONTRAST Result Date: 12/30/2023 EXAM: MRI BRAIN WITHOUT CONTRAST 12/30/2023 05:45:53 PM TECHNIQUE: Multiplanar multisequence MRI of the head/brain was performed without the administration of intravenous contrast. COMPARISON: CT head and CTA head and neck dated 12/30/2023. MRI head dated 12/30/2023. CLINICAL HISTORY: Neuro deficit, acute, stroke suspected. Stroke rule out.  FINDINGS: BRAIN AND VENTRICLES: Remote left PCA (posterior cerebral artery) territory infarcts. There are nonspecific hyperintense foci in the subcortical and periventricular white matter that most likely represent chronic microangiopathic ischemic changes in a patient of this age. Moderate generalized parenchymal volume loss. Small remote infarct in the left centrum semiovale. There are numerous scattered chronic micro hemorrhages in the cerebral hemispheres particularly within the temporal and occipital lobes with additional chronic micro hemorrhages in the left dorsal midbrain as well as within the bilateral cerebellum. There is restricted diffusion within the right basal ganglia primarily involving the superior aspect of the lentiform nucleus as well as the right caudate head extending into the body of the right caudate nucleus. Additional small remote infarcts in  the right occipital cortex within the right PCA territory. ORBITS: No acute abnormality. SINUSES AND MASTOIDS: Bilateral mastoid effusions. BONES AND SOFT TISSUES: Normal marrow signal. No acute soft tissue abnormality. IMPRESSION: 1. Acute infarct in the right basal ganglia. 2. Remote left PCA territory infarcts and additional small remote infarcts in the right occipital cortex within the right PCA territory. 3. 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 4. Moderate generalized parenchymal volume loss. Electronically signed by: Donnice Mania MD 12/30/2023 06:56 PM EDT RP Workstation: [id number]   CT ANGIO HEAD NECK W WO CM Result Date: 12/30/2023 EXAM: CTA HEAD AND NECK WITH AND WITHOUT 12/30/2023 04:41:29 PM TECHNIQUE: CTA of the head and neck was performed with and without the administration of intravenous contrast. Multiplanar 2D and/or 3D reformatted images are provided for review. Automated exposure control, iterative reconstruction, and/or weight based adjustment of the mA/kV was  utilized to reduce the radiation dose to as low as reasonably achievable. Stenosis of the internal carotid arteries measured using NASCET criteria. COMPARISON: 12/15/2022 CLINICAL HISTORY: Neuro deficit, acute, stroke suspected. FINDINGS: CTA NECK: AORTIC ARCH AND ARCH VESSELS: No dissection or arterial injury. No significant stenosis of the brachiocephalic or subclavian arteries. CERVICAL CAROTID ARTERIES: Mild atherosclerosis at the right carotid bifurcation without hemodynamically significant stenosis. Additional mild atherosclerosis at the left carotid bifurcation without hemodynamically significant stenosis. CERVICAL VERTEBRAL ARTERIES: Tortuosity of the right V1 segment. Also tortuosity of the left V1 segment and the bilateral V2 segments. Atherosclerosis of the bilateral V4 segments resulting in focal moderate stenosis of the V4 segment right vertebral artery. LUNGS AND MEDIASTINUM: Unremarkable. SOFT TISSUES: Multiple subcentimeter thyroid  nodules. No cervical lymphadenopathy. BONES: No acute or aggressive finding of the osseous structures. 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 CTA HEAD: ANTERIOR CIRCULATION: No significant stenosis of the internal carotid arteries. The ACAs are patent bilaterally. There is focal moderate stenosis of the distal right M1 segment which is new from prior. The MCAs are otherwise patent and normal in caliber. POSTERIOR CIRCULATION: Redemonstrated occlusion of the proximal P2 segment of the left PCA with reconstitution at the mid P2 segment. The PCAs are otherwise patent bilaterally. No significant stenosis of the basilar artery. No aneurysm. OTHER: No dural venous sinus thrombosis on this non-dedicated study. Mild atherosclerosis of the carotid siphons without stenosis. IMPRESSION: 1. New focal moderate stenosis of the distal M1 segment of the right MCA.  2. Redemonstrated occlusion of the proximal P2 segment of the left PCA with reconstitution at the mid P2 segment. 3. Focal moderate stenosis of the V4 segment right vertebral artery. 4. Mild atherosclerosis at the right and left carotid bifurcations without hemodynamically significant stenosis. Electronically signed by: Donnice Mania MD 12/30/2023 05:03 PM EDT RP Workstation: [id number]    Vitals:   12/30/23 2320 12/31/23 0403 12/31/23 0748 12/31/23 1123  BP: (!) 156/96 92/65 138/86 126/84  Pulse: 71 (!) 59 64 65  Resp: 16 16 19 17   Temp: 97.8 F (36.6 C) 99.3 F (37.4 C) 97.7 F (36.5 C) (!) 97.5 F (36.4 C)  TempSrc: Oral Oral Oral Oral  SpO2: 97% 96% 94% 97%  Weight:         PHYSICAL EXAM General: Frail elderly African-American male no acute distress Psych:  Flat affect CV: Regular rate and rhythm on monitor Respiratory:  Regular, unlabored respirations on room air GI: Abdomen soft and nontender   NEURO:  Mental Status:  Patient is drowsy but opens eyes to voice; follows one step commands, no verbal output. Speech/Language: mute Pseudobulbar affect with brisk jaw jerk  Cranial Nerves:  II: PERRL. Visual fields full.  III, IV, VI: EOMI. Eyelids elevate symmetrically.  V: Sensation is intact to light touch and symmetrical to face.  VII: left facial droop VIII: hearing intact to voice. IX, X: Palate elevates symmetrically. Phonation is normal.  XI: head is midline  XII: tongue is midline without fasciculations. Motor left arm and leg weakness 3/5.  Good strength on the right side. Elevates right arm without drift. Antigravity strength in bilateral lower extremities, right stronger than left Tone: is increased Jaw jerk brisk  Sensation- withdraws in all extremities Coordination: no ataxia noted  Gait- deferred  Most Recent NIH 11     ASSESSMENT/PLAN  [name] is a 83 y.o. 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   Acute Ischemic Infarct:  right basal ganglia infarct Etiology:  appears embolic given large size and CT angiogram showing focal moderate right M1 stenosis and in previous CTA from October 2024 there was no narrowing Code Stroke CT head No acute abnormality. ASPECTS 10.    CTA head & neck New focal moderate stenosis of the distal M1 segment of the right MCA. Redemonstrated occlusion of the proximal P2 segment of the left PCA with reconstitution at the mid P2 segment. Focal moderate stenosis of the V4 segment right vertebral artery. Mild atherosclerosis at the right and left carotid bifurcations without hemodynamically significant stenosis. MRI  Acute infarct in the right basal ganglia. Remote left PCA territory infarcts and additional small remote infarcts in the right occipital cortex within the right PCA territory. 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 Moderate generalized parenchymal volume loss. 2D Echo EF 55-60% LDL 64 HgbA1c 5.2 VTE prophylaxis - SCDs aspirin  81 mg daily prior to admission, now on aspirin  81 mg daily and clopidogrel  75 mg daily for 3 weeks and then plavix  alone. Therapy recommendations: CIR Disposition:  pending completion of work up  Hx of small left occipital ICH 06/2004 CAA diagnosed in 2021- appears to be more uncontrolled hypertension than CAA History of ischemic strokes 2022, 2020, and 2014 Acute microhemorrhage on MRI brain 03/2021 Atrial flutter history not on AC due to history of CAA with ICH CAA spells with episode of unresponsiveness, extensive unremarkable work up including neuroimaging and EEGs in the past Spells described as transient confusion and  aphasia  Atrial fibrillation Home Meds: None due to CAA with multiple chronic microhemorrhages and history of ICH Continue telemetry monitoring PPM interrogation requested. Check paper chart for report  Previous interrogations with <1% afib burden  Hypertension Home meds:  Norvasc , Toprol -XL Stable  Dysphagia Patient has post-stroke dysphagia, SLP consulted    Diet   Diet NPO time specified   Advance diet as tolerated  Other Active Problems Dementia - namenda   Hospital day # 0   Patient seen and examined by NP/APP with MD. MD to update note as needed.   Jorene Last, DNP, FNP-BC [facility] Pager: [phone] I have personally obtained history,examined this patient, reviewed notes, independently viewed imaging studies, participated in medical decision making and plan of care.ROS completed by me personally  and pertinent positives fully documented  I have made any additions or clarifications directly to the above note. Agree with note above.  Patient with prior history of strokes, TIAs, intracerebral hemorrhage, mixed vascular dementia and suspected amyloid angiopathy presented with sudden onset of left hemiplegia and facial weakness and speech difficulty MRI scan showing a fairly large right basal ganglia infarct with CT angiogram showing focal moderate right M1 stenosis.  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  Recommend aspirin  and Plavix  for 3 weeks followed by Plavix  alone and aggressive risk factor modification.  Patient will not be the best candidate for long-term anticoagulation given multiple microhemorrhages his MRI and baseline dementia hence will not pursue prolonged cardiac monitoring at this time.  Physical occupational and speech therapy consults.  Patient may need a feeding tube as he may have trouble swallowing due to severe pseudobulbar state.  Mobilize out of bed.  Therapy consults.  No family available at the  bedside for discussion.  Discussed with Dr. Trixie   I personally spent a total of 50 minutes in the care of the patient today including getting/reviewing separately obtained history, performing a medically appropriate exam/evaluation, counseling and educating, placing orders, referring and communicating with other health care professionals, documenting clinical information in the EHR, independently interpreting results, and coordinating care.        Eather Popp, MD Medical Director [facility] Pager: [phone] 12/31/2023 3:04 PM  To contact Stroke Continuity provider, please refer to [email or web address]. After hours, contact General Neurology

## 2023-12-31 NOTE — Plan of Care (Signed)
  Problem: SLP Dysphagia Goals Goal: Patient will demonstrate readiness for PO's Description: Patient will demonstrate readiness for PO's and/or instrumental swallow study as evidenced by: Flowsheets (Taken 12/31/2023 0910) Patient will demonstrate readiness for PO's and/or instrumental swallow study as evidenced by:  sustained attention to PO trials  initiation of swallow sequence  with min assist

## 2023-12-31 NOTE — Evaluation (Signed)
 Clinical/Bedside Swallow Evaluation Patient Details  Name: Dustin Hamilton MRN: [id number] Date of Birth: [birth year]  Today's Date: 12/31/2023 Time: SLP Start Time (ACUTE ONLY): 0736 SLP Stop Time (ACUTE ONLY): 0750 SLP Time Calculation (min) (ACUTE ONLY): 14 min  Past Medical History:  Past Medical History:  Diagnosis Date   AMS (altered mental status) 03/15/2023   ARF (acute renal failure) (HCC) 09/30/2023   BPH (benign prostatic hyperplasia)    Cerebral infarction (HCC) 09/13/2012   IMO SNOMED Dx Update Oct 2024     Cerebrovascular disease    Clostridium difficile diarrhea    Congenital anomaly of diaphragm    Elevated PSA    Glaucoma, both eyes    Hemorrhoid    Hepatitis B surface antigen positive    02-20-2011   History of adenomatous polyp of colon    2007, 2009 and 2013  tubular adenoma's   History of alcohol  abuse    quit 1963   History of cerebral parenchymal hemorrhage    01/ 2006  left occiptial lobe related to hypertensive crisis   History of CVA (cerebrovascular accident)    09-12-2012  left hippocampus/ amygdala junction and per MRI old white matter infarcts--  per pt residual short- term memory issues   History of fatty infiltration of liver hx visit's at 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    Urethral lesion    urethral mass   UTI (urinary tract infection) 03/15/2023   Past Surgical History:  Past Surgical History:  Procedure Laterality Date   CARDIOVASCULAR STRESS TEST  05/05/2007   normal nuclear study w/ no ischemia/  normal LV fucntion and wall motion , ef60%   COLONOSCOPY  last one 04-06-2012   CYSTO/  LEFT  RETROGRADE PYELOGRAM/ CYTOLOGY WASHINGS/  URETEROSCOPY  03/05/2000   INGUINAL HERNIA REPAIR Bilateral 1965 and 1980's   IR 3D INDEPENDENT WKST  07/31/2022   IR ANGIOGRAM PELVIS SELECTIVE OR SUPRASELECTIVE  07/31/2022   IR ANGIOGRAM SELECTIVE EACH ADDITIONAL VESSEL  07/31/2022   IR ANGIOGRAM SELECTIVE EACH ADDITIONAL VESSEL  07/31/2022   IR ANGIOGRAM SELECTIVE EACH ADDITIONAL VESSEL  07/31/2022   IR ANGIOGRAM SELECTIVE EACH ADDITIONAL VESSEL  07/31/2022   IR EMBO TUMOR ORGAN ISCHEMIA INFARCT INC GUIDE ROADMAPPING  07/31/2022   IR RADIOLOGIST EVAL & MGMT  06/26/2022   IR RADIOLOGIST EVAL & MGMT  08/08/2022   IR RADIOLOGIST EVAL & MGMT  08/21/2022   IR RADIOLOGIST EVAL & MGMT  03/25/2023   IR US  GUIDE VASC ACCESS RIGHT  07/31/2022   LAPAROSCOPIC INGUINAL HERNIA WITH UMBILICAL HERNIA Right 06/24/2007   LIVER BIOPSY  1980's and 2004   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   Procedure: PACEMAKER IMPLANT;  Surgeon: Waddell Danelle ORN, MD;  Location: [facility];  Service: Cardiovascular;  Laterality: N/A;   SVT ABLATION N/A 11/15/2018   Procedure: SVT ABLATION;  Surgeon: Waddell Danelle ORN, MD;  Location: [facility];  Service: Cardiovascular;  Laterality: N/A;   TRANSTHORACIC ECHOCARDIOGRAM  09/13/2012   moderate LVH,  ef 60-65%/  TRANSURETHRAL RESECTION OF BLADDER TUMOR N/A 08/11/2016   Procedure: TRANSURETHRAL RESECTION OF BLADDER TUMOR (TURBT);  Surgeon: Belvie LITTIE Clara, MD;  Location: [facility];  Service: Urology;  Laterality: N/A;   HPI:  Dustin Hamilton is a 83 y.o. male with medical history significant of hypertension, hyperlipidemia, TIA, CVA, amyloid angiopathy, paroxysmal SVT status post ablation, complete heart block status post pacemaker, secondary parkinsonism, dementia, depression, OSA, gout, BPH, glaucoma, chronic pain presenting with left-sided deficits as a code stroke. MRI revealed 1. Acute infarct in the right basal ganglia.  2. Remote left PCA  territory infarcts and additional small remote infarcts in  the right occipital cortex within the right PCA territory.    Assessment / Plan / Recommendation  Clinical Impression   Pt presents with a severe oral dysphagia and concerns for a pharyngeal dysphagia based on clinical swallow assessment completed today. A safe oral diet cannot be recommended at this time.   Pt with significant flat affect and minimal spontaneous movement. He opened eyes with encouragement and reflexively squeezed hands of MD (at bedside). He did wiggle his toes on MD's command. Pt did not open mouth volitionally following command, but did open mouth reflexively when ice chip was placed to lips.   Oral deficits characterized by poor oral awareness, reduced oral manipulation with significant oral holding of trials. Ice chips and nectar-thick liquid trial both suctioned from oral cavity due to absent initiation of swallow sequence. Observed swallow response x1 with tsp sip of thin liquids. This was followed by immediate coughing, concerning for aspiration event. No other initiation of swallow observed with PO trials or for secretion management.   Recommend continue NPO. Consider Palliative Care consult to discuss GoC. If GoC remain conservative, pt will need non-oral nutrition/hydration. SLP to follow up based on GoC.  SLP Visit Diagnosis: Dysphagia, unspecified (R13.10)    Aspiration Risk  Severe aspiration risk;Risk for inadequate nutrition/hydration    Diet Recommendation NPO (Q4 oral care)    Liquid Administration via: Spoon Medication Administration: Via alternative means    Other  Recommendations Recommended Consults:  (Palliative Care) Oral Care Recommendations: Oral care QID     Assistance Recommended at Discharge  Full supervision  Functional Status Assessment Patient has had a recent decline in their functional status and/or demonstrates limited ability to make significant improvements in function in a  reasonable and predictable amount of time  Frequency and Duration min 2x/week  4 weeks       Prognosis Prognosis for improved oropharyngeal function: Guarded Barriers to Reach Goals: Cognitive deficits;Severity of deficits;Behavior      Swallow Study   General Date of Onset: 12/30/23 HPI: Dustin Hamilton is a 83 y.o. male with medical history significant of hypertension, hyperlipidemia, TIA, CVA, amyloid angiopathy, paroxysmal SVT status post ablation, complete heart block status post pacemaker, secondary parkinsonism, dementia, depression, OSA, gout, BPH, glaucoma, chronic pain presenting with left-sided deficits as a code stroke. MRI revealed 1. Acute infarct in the right basal ganglia.  2. Remote left PCA territory infarcts and additional small remote infarcts in  the right occipital cortex within the right PCA territory. Type of Study: Bedside Swallow Evaluation Diet Prior to this Study: NPO Temperature Spikes Noted: No Respiratory Status: Room air History of Recent Intubation: No Behavior/Cognition: Alert;Requires cueing;Doesn't follow directions;Lethargic/Drowsy Oral Cavity Assessment:  (unable to assess) Oral Care Completed by SLP: Yes Oral Cavity - Dentition: Adequate natural dentition Self-Feeding Abilities: Total assist Patient Positioning: Upright in bed Baseline Vocal  Quality: Not observed Volitional Cough: Cognitively unable to elicit Volitional Swallow: Unable to elicit    Oral/Motor/Sensory Function Overall Oral Motor/Sensory Function:  (unable to assess)   Ice Chips Ice chips: Impaired Oral Phase Impairments: Impaired mastication;Poor awareness of bolus Oral Phase Functional Implications: Oral holding Pharyngeal Phase Impairments:  (absent swallow initiation)   Thin Liquid Thin Liquid: Impaired Presentation: Spoon Oral Phase Impairments: Poor awareness of bolus;Reduced lingual movement/coordination Oral Phase Functional Implications:  (suspect posterior loss vs  swallow delay) Pharyngeal  Phase Impairments: Suspected delayed Swallow;Cough - Immediate;Decreased hyoid-laryngeal movement    Nectar Thick Nectar Thick Liquid: Impaired Presentation: Spoon Oral Phase Impairments: Poor awareness of bolus Oral phase functional implications: Oral holding Pharyngeal Phase Impairments:  (absent swallow- no initiation)   Honey Thick Honey Thick Liquid: Not tested   Puree Puree: Not tested   Solid     Solid: Not tested      Dustin Hamilton 12/31/2023,9:21 AM

## 2024-01-01 ENCOUNTER — Inpatient Hospital Stay (HOSPITAL_COMMUNITY)

## 2024-01-01 DIAGNOSIS — R29818 Other symptoms and signs involving the nervous system: Secondary | ICD-10-CM | POA: Diagnosis not present

## 2024-01-01 DIAGNOSIS — I4891 Unspecified atrial fibrillation: Secondary | ICD-10-CM | POA: Diagnosis not present

## 2024-01-01 DIAGNOSIS — I69391 Dysphagia following cerebral infarction: Secondary | ICD-10-CM | POA: Diagnosis not present

## 2024-01-01 DIAGNOSIS — I6340 Cerebral infarction due to embolism of unspecified cerebral artery: Secondary | ICD-10-CM | POA: Diagnosis not present

## 2024-01-01 DIAGNOSIS — R29711 NIHSS score 11: Secondary | ICD-10-CM | POA: Diagnosis not present

## 2024-01-01 DIAGNOSIS — E43 Unspecified severe protein-calorie malnutrition: Secondary | ICD-10-CM | POA: Insufficient documentation

## 2024-01-01 LAB — GLUCOSE, CAPILLARY
Glucose-Capillary: 111 mg/dL — ABNORMAL HIGH (ref 70–99)
Glucose-Capillary: 132 mg/dL — ABNORMAL HIGH (ref 70–99)

## 2024-01-01 MED ORDER — THIAMINE MONONITRATE 100 MG PO TABS
100.0000 mg | ORAL_TABLET | Freq: Every day | ORAL | Status: DC
  Administered 2024-01-01 – 2024-01-04 (×4): 100 mg
  Filled 2024-01-01 (×4): qty 1

## 2024-01-01 MED ORDER — OSMOLITE 1.5 CAL PO LIQD
1000.0000 mL | ORAL | Status: DC
  Administered 2024-01-01 – 2024-01-04 (×3): 1000 mL
  Filled 2024-01-01: qty 1000

## 2024-01-01 MED ORDER — ADULT MULTIVITAMIN W/MINERALS CH
1.0000 | ORAL_TABLET | Freq: Every day | ORAL | Status: DC
  Administered 2024-01-01 – 2024-01-03 (×3): 1 via ORAL
  Filled 2024-01-01 (×4): qty 1

## 2024-01-01 MED ORDER — PROSOURCE TF20 ENFIT COMPATIBL EN LIQD
60.0000 mL | Freq: Every day | ENTERAL | Status: DC
  Administered 2024-01-01 – 2024-01-04 (×4): 60 mL
  Filled 2024-01-01 (×4): qty 60

## 2024-01-01 MED ORDER — FREE WATER
150.0000 mL | Freq: Four times a day (QID) | Status: DC
  Administered 2024-01-01 – 2024-01-04 (×12): 150 mL

## 2024-01-01 NOTE — Progress Notes (Signed)
 STROKE TEAM PROGRESS NOTE   INTERIM HISTORY/SUBJECTIVE Patient is doing slightly better today.  He is following commands and speaking a few words including his name but still unable to speak sentences.  He is awaiting swallow eval by speech therapist. Vital signs are stable. OBJECTIVE  CBC    Component Value Date/Time   WBC 8.1 12/31/2023 0822   RBC 5.47 12/31/2023 0822   HGB 17.3 (H) 12/31/2023 0822   HGB 17.2 11/12/2018 0950   HCT 50.8 12/31/2023 0822   HCT 52.0 (H) 11/12/2018 0950   PLT 146 (L) 12/31/2023 0822   PLT 144 (L) 11/12/2018 0950   MCV 92.9 12/31/2023 0822   MCV 92 11/12/2018 0950   MCH 31.6 12/31/2023 0822   MCHC 34.1 12/31/2023 0822   RDW 13.5 12/31/2023 0822   RDW 13.7 11/12/2018 0950   LYMPHSABS 0.8 12/30/2023 1044   LYMPHSABS 1.3 11/12/2018 0950   MONOABS 0.3 12/30/2023 1044   EOSABS 0.1 12/30/2023 1044   EOSABS 0.1 11/12/2018 0950   BASOSABS 0.0 12/30/2023 1044   BASOSABS 0.0 11/12/2018 0950    BMET    Component Value Date/Time   NA 142 12/31/2023 0822   NA 143 11/12/2018 0950   K 3.6 12/31/2023 0822   CL 108 12/31/2023 0822   CO2 23 12/31/2023 0822   GLUCOSE 100 (H) 12/31/2023 0822   BUN 14 12/31/2023 0822   BUN 15 11/12/2018 0950   CREATININE 1.03 12/31/2023 0822   CALCIUM  10.1 12/31/2023 0822   GFRNONAA >60 12/31/2023 0822    IMAGING past 24 hours No results found.   Vitals:   12/31/23 1924 12/31/23 2329 01/01/24 0256 01/01/24 0723  BP: (!) 157/84 (!) 155/87 (!) 149/88 (!) 155/83  Pulse: 60 (!) 59 60 (!) 59  Resp: 18 18 18 19   Temp: 98.6 F (37 C) 98.3 F (36.8 C) 97.6 F (36.4 C) 97.6 F (36.4 C)  TempSrc: Oral Oral Oral Oral  SpO2: 98% 99% 97% 97%  Weight:      Height:         PHYSICAL EXAM General: Frail elderly African-American male no acute distress Psych:  Flat affect CV: Regular rate and rhythm on monitor Respiratory:  Regular, unlabored respirations on room air GI: Abdomen soft and nontender   NEURO:  Mental  Status: Patient is drowsy but opens eyes to voice; follows one step commands, speaks only occasional words.  Unable to speak sentences. Speech/Language: mute Pseudobulbar affect with brisk jaw jerk  Cranial Nerves:  II: PERRL. Visual fields full.  III, IV, VI: EOMI. Eyelids elevate symmetrically.  V: Sensation is intact to light touch and symmetrical to face.  VII: left facial droop VIII: hearing intact to voice. IX, X: Palate elevates symmetrically.   XI: head is midline  XII: tongue is midline without fasciculations. Motor left arm and leg weakness 3/5.  Good strength on the right side. Elevates right arm without drift. Antigravity strength in bilateral lower extremities, right stronger than left Tone: is increased Jaw jerk brisk  Sensation- withdraws in all extremities Coordination: no ataxia noted  Gait- deferred  Most Recent NIH 11     ASSESSMENT/PLAN  [name] is a 83 y.o. male with history of  cerebral amyloid angiopathy with multiple amyloid spells/transient focal neurological deficits associated with cerebral amyloid angiopathy, prior history of ICH, hypertension who is brought in by EMS as a code stroke for concern for decreased responsivenss, left sided weakness and L facial droop.   Acute Ischemic  Infarct:  right basal ganglia infarct Etiology:  appears embolic given large size and CT angiogram showing focal moderate right M1 stenosis and in previous CTA from October 2024 there was no narrowing Code Stroke CT head No acute abnormality. ASPECTS 10.    CTA head & neck New focal moderate stenosis of the distal M1 segment of the right MCA. Redemonstrated occlusion of the proximal P2 segment of the left PCA with reconstitution at the mid P2 segment. Focal moderate stenosis of the V4 segment right vertebral artery. Mild atherosclerosis at the right and left carotid bifurcations without hemodynamically significant stenosis. MRI  Acute infarct in the right basal  ganglia. Remote left PCA territory infarcts and additional small remote infarcts in the right occipital cortex within the right PCA territory. 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 Moderate generalized parenchymal volume loss. 2D Echo EF 55-60% LDL 64 HgbA1c 5.2 VTE prophylaxis - SCDs aspirin  81 mg daily prior to admission, now on aspirin  81 mg daily and clopidogrel  75 mg daily for 3 weeks and then plavix  alone. Therapy recommendations: CIR Disposition:  pending completion of work up  Hx of small left occipital ICH 06/2004 CAA diagnosed in 2021- appears to be more uncontrolled hypertension than CAA History of ischemic strokes 2022, 2020, and 2014 Acute microhemorrhage on MRI brain 03/2021 Atrial flutter history not on AC due to history of CAA with ICH CAA spells with episode of unresponsiveness, extensive unremarkable work up including neuroimaging and EEGs in the past Spells described as transient confusion and aphasia  Atrial fibrillation Home Meds: None due to CAA with multiple chronic microhemorrhages and history of ICH Continue telemetry monitoring PPM interrogation requested. Check paper chart for report  Previous interrogations with <1% afib burden  Hypertension Home meds:  Norvasc , Toprol -XL Stable  Dysphagia Patient has post-stroke dysphagia, SLP consulted    Diet   Diet NPO time specified   Advance diet as tolerated  Other Active Problems Dementia - namenda   Hospital day # 1    Patient with prior history of strokes, TIAs, intracerebral hemorrhage, mixed vascular dementia and suspected amyloid angiopathy presented with sudden onset of left hemiplegia and facial weakness and speech difficulty MRI scan showing a fairly large right basal ganglia infarct with CT angiogram showing focal moderate right M1 stenosis.  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  Recommend  aspirin  and Plavix  for 3 weeks followed by Plavix  alone and aggressive risk factor modification.  Patient will not be the best candidate for long-term anticoagulation given multiple microhemorrhages on his MRI and baseline dementia hence will not pursue prolonged cardiac monitoring at this time.  Physical occupational and speech therapy consults.  Patient patient will likely need a feeding tube as he may have trouble swallowing due to severe pseudobulbar state.  Await speech therapy evaluation today.  Mobilize out of bed.  Therapy consults.  No family available at the bedside for discussion.  Discussed with Dr. Trixie and speech therapist   I personally spent a total of 35 minutes in the care of the patient today including getting/reviewing separately obtained history, performing a medically appropriate exam/evaluation, counseling and educating, placing orders, referring and communicating with other health care professionals, documenting clinical information in the EHR, independently interpreting results, and coordinating care.        Eather Popp, MD Medical Director [facility] Pager: [phone] 01/01/2024 11:57 AM  To contact Stroke Continuity provider, please refer to [email or web address]. After hours,  contact General Neurology

## 2024-01-01 NOTE — Progress Notes (Signed)
 CM has updated the [place] on patients admission. PCP: Dr Jayson at [facility] SW: Bettejane kelton: [phone] ext [phone]

## 2024-01-01 NOTE — Plan of Care (Signed)
  Problem: Education: Goal: Knowledge of disease or condition will improve Outcome: Not Progressing Goal: Knowledge of secondary prevention will improve (MUST DOCUMENT ALL) Outcome: Not Progressing   Problem: Ischemic Stroke/TIA Tissue Perfusion: Goal: Complications of ischemic stroke/TIA will be minimized Outcome: Progressing   Problem: Coping: Goal: Will verbalize positive feelings about self Outcome: Not Progressing Goal: Will identify appropriate support needs Outcome: Not Progressing   Problem: Health Behavior/Discharge Planning: Goal: Ability to manage health-related needs will improve Outcome: Not Progressing Goal: Goals will be collaboratively established with patient/family Outcome: Progressing   Problem: Self-Care: Goal: Ability to participate in self-care as condition permits will improve Outcome: Not Progressing Goal: Verbalization of feelings and concerns over difficulty with self-care will improve Outcome: Progressing Goal: Ability to communicate needs accurately will improve Outcome: Progressing   Problem: Nutrition: Goal: Risk of aspiration will decrease Outcome: Progressing Goal: Dietary intake will improve Outcome: Progressing   Problem: Health Behavior/Discharge Planning: Goal: Ability to manage health-related needs will improve Outcome: Not Progressing   Problem: Clinical Measurements: Goal: Ability to maintain clinical measurements within normal limits will improve Outcome: Progressing Goal: Will remain free from infection Outcome: Progressing Goal: Diagnostic test results will improve Outcome: Progressing Goal: Respiratory complications will improve Outcome: Progressing Goal: Cardiovascular complication will be avoided Outcome: Progressing   Problem: Activity: Goal: Risk for activity intolerance will decrease Outcome: Progressing   Problem: Nutrition: Goal: Adequate nutrition will be maintained Outcome: Progressing   Problem:  Elimination: Goal: Will not experience complications related to bowel motility Outcome: Progressing Goal: Will not experience complications related to urinary retention Outcome: Progressing   Problem: Pain Managment: Goal: General experience of comfort will improve and/or be controlled Outcome: Progressing   Problem: Skin Integrity: Goal: Risk for impaired skin integrity will decrease Outcome: Progressing

## 2024-01-01 NOTE — Evaluation (Addendum)
 Speech Language Pathology Evaluation Patient Details Name: Dustin Hamilton MRN: [id number] DOB: January 11, 1941 Today's Date: 01/01/2024 Time: 8951-8941 SLP Time Calculation (min) (ACUTE ONLY): 10 min  Problem List:  Patient Active Problem List   Diagnosis Date Noted   Depression, unspecified 12/30/2023   Focal neurological deficit 12/30/2023   Hypoglycemia 03/15/2023   Glaucoma 03/15/2023   Gout 03/15/2023   Hypotension 10/19/2022   Dementia (HCC) 07/02/2022   Generalized weakness 07/02/2022   Chronic low back pain 02/06/2022   Mixed hyperlipidemia 02/06/2022   Obstructive sleep apnea 02/06/2022   Secondary parkinsonism, unspecified (HCC) 02/06/2022   Status cardiac pacemaker 02/06/2022   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   Fever and chills 04/01/2021   Complete heart block (HCC)    Heart block    Syncope 05/03/2020   Thrombocytopenia (HCC) 05/03/2020   Atypical chest pain 01/27/2020   Paroxysmal SVT (supraventricular tachycardia) (HCC) 06/03/2017   Dizziness 08/02/2014   Vertigo 09/12/2012   BACK PAIN 06/20/2009   COLONIC POLYPS 05/06/2007   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   NONDEPENDENT ALCOHOL  ABUSE IN REMISSION 05/06/2007   Essential hypertension 05/06/2007   Hypertensive heart disease without heart failure 05/06/2007   INTRACRANIAL HEMORRHAGE 05/06/2007   History of TIA (transient ischemic attack) and stroke 05/06/2007   HEMORRHOIDS 05/06/2007   INGUINAL HERNIA, RIGHT 05/06/2007   RENAL CYST 05/06/2007   HEMATURIA UNSPECIFIED 05/06/2007   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   PSA, INCREASED 05/06/2007   LIVER FUNCTION TESTS, ABNORMAL 05/06/2007   Benign prostatic hyperplasia with urinary obstruction 05/06/2007   Past Medical History:  Past Medical History:  Diagnosis Date   AMS (altered mental status) 03/15/2023   ARF (acute renal failure) (HCC) 09/30/2023   BPH (benign prostatic hyperplasia)    Cerebral infarction (HCC)  09/13/2012   IMO SNOMED Dx Update Oct 2024     Cerebrovascular disease    Clostridium difficile diarrhea    Congenital anomaly of diaphragm    Elevated PSA    Glaucoma, both eyes    Hemorrhoid    Hepatitis B surface antigen positive    02-20-2011   History of adenomatous polyp of colon    2007, 2009 and 2013  tubular adenoma's   History of alcohol  abuse    quit 1963   History of cerebral parenchymal hemorrhage    01/ 2006  left occiptial lobe related to hypertensive crisis   History of CVA (cerebrovascular accident)    09-12-2012  left hippocampus/ amygdala junction and per MRI old white matter infarcts--  per pt residual short- term memory issues   History of fatty infiltration of liver hx visit's at 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    Urethral lesion    urethral mass   UTI (urinary tract infection) 03/15/2023   Past Surgical History:  Past Surgical History:  Procedure Laterality Date   CARDIOVASCULAR STRESS TEST  05/05/2007   normal nuclear study w/ no ischemia/  normal LV fucntion and wall motion , ef60%   COLONOSCOPY  last one 04-06-2012  CYSTO/  LEFT RETROGRADE PYELOGRAM/ CYTOLOGY WASHINGS/  URETEROSCOPY  03/05/2000   INGUINAL HERNIA REPAIR Bilateral 1965 and 1980's   IR 3D INDEPENDENT WKST  07/31/2022   IR ANGIOGRAM PELVIS SELECTIVE OR SUPRASELECTIVE  07/31/2022   IR ANGIOGRAM SELECTIVE EACH ADDITIONAL VESSEL  07/31/2022   IR ANGIOGRAM SELECTIVE EACH ADDITIONAL VESSEL  07/31/2022   IR ANGIOGRAM SELECTIVE EACH ADDITIONAL VESSEL  07/31/2022   IR ANGIOGRAM SELECTIVE EACH ADDITIONAL VESSEL  07/31/2022   IR EMBO TUMOR ORGAN ISCHEMIA  INFARCT INC GUIDE ROADMAPPING  07/31/2022   IR RADIOLOGIST EVAL & MGMT  06/26/2022   IR RADIOLOGIST EVAL & MGMT  08/08/2022   IR RADIOLOGIST EVAL & MGMT  08/21/2022   IR RADIOLOGIST EVAL & MGMT  03/25/2023   IR US  GUIDE VASC ACCESS RIGHT  07/31/2022   LAPAROSCOPIC INGUINAL HERNIA WITH UMBILICAL HERNIA Right 06/24/2007   LIVER BIOPSY  1980's and 2004   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   Procedure: PACEMAKER IMPLANT;  Surgeon: Waddell Danelle ORN, MD;  Location: [facility];  Service: Cardiovascular;  Laterality: N/A;   SVT ABLATION N/A 11/15/2018   Procedure: SVT ABLATION;  Surgeon: Waddell Danelle ORN, MD;  Location: [facility];  Service: Cardiovascular;  Laterality: N/A;   TRANSTHORACIC ECHOCARDIOGRAM  09/13/2012   moderate LVH,  ef 60-65%/     TRANSURETHRAL RESECTION OF BLADDER TUMOR N/A 08/11/2016   Procedure: TRANSURETHRAL RESECTION OF BLADDER TUMOR (TURBT);  Surgeon: Belvie LITTIE Clara, MD;  Location[facility];  Service: Urology;  Laterality: N/A;   HPI:  Dustin Hamilton is a 83 y.o. male with medical history significant of hypertension, hyperlipidemia, TIA, CVA, amyloid angiopathy, paroxysmal SVT status post ablation, complete heart block status post pacemaker, secondary parkinsonism, dementia, depression, OSA, gout, BPH, glaucoma, chronic pain presenting with left-sided deficits as a code stroke. MRI revealed 1. Acute infarct in the right basal ganglia.  2. Remote left PCA territory infarcts and additional small remote infarcts in  the right occipital cortex within the right PCA territory.   Assessment / Plan / Recommendation Clinical Impression  Pt is more alert and verbal than previous date, but still with flat affect and limited mostly to the word level. He seems to say a few words at times, but intelligibility is reduced and it is difficult to decipher exactly what he is saying. Speech is impacted by low volume, monotonous pitch, and imprecise  articulation. Pt did state his name and location ([place] [place], hospital from [facility]), but perseverated on [place] when asked about date and situation. He followed some one-step commands, including oral motor commands, with Min-Mod cues provided. Hand-over-hand assist was often needed for initiation of tasks. Note that pt does have baseline cognitive/communicative changes per review of previous documentation, but this does seem to be an acute decline for which additional SLP f/u is recommended.      SLP Assessment  SLP Recommendation/Assessment: Patient needs continued Speech Language Pathology Services SLP Visit Diagnosis: Dysarthria and anarthria (R47.1);Cognitive communication deficit (R41.841)     Assistance Recommended at Discharge  Frequent or constant Supervision/Assistance  Functional Status Assessment Patient has had a recent decline in their functional status and/or demonstrates limited ability to make significant improvements in function in a reasonable and predictable amount of time  Frequency and Duration min 2x/week  2 weeks      SLP Evaluation Cognition  Overall Cognitive Status: Impaired/Different from baseline Arousal/Alertness: Awake/alert Orientation Level: Oriented to person;Oriented to place;Disoriented to time;Disoriented to  situation Attention: Sustained Sustained Attention: Impaired Sustained Attention Impairment: Functional basic;Verbal basic Problem Solving: Impaired Problem Solving Impairment: Functional basic       Comprehension  Auditory Comprehension Overall Auditory Comprehension: Impaired Commands: Impaired One Step Basic Commands: 50-74% accurate    Expression Expression Primary Mode of Expression: Verbal Verbal Expression Overall Verbal Expression: Impaired Initiation: Impaired Automatic Speech: Name Level of Generative/Spontaneous Verbalization: Word Pragmatics: Impairment Impairments: Abnormal affect;Eye contact;Monotone   Oral  / Motor  Motor Speech Overall Motor Speech: Impaired Respiration: Within functional limits Phonation: Other (comment) (monotone) Articulation: Impaired Level of Impairment: Word Intelligibility: Intelligibility reduced Word: 50-74% accurate            Leita SAILOR., M.A. CCC-SLP Acute Rehabilitation Services Office: [phone]  Secure chat preferred  01/01/2024, 1:07 PM

## 2024-01-01 NOTE — Progress Notes (Signed)
 Physical Therapy Treatment Patient Details Name: Dustin Hamilton MRN: [id number] DOB: 1940/07/01 Today's Date: 01/01/2024   History of Present Illness Pt is an 83 yo male presenting to [facility] on 12/30/19 with L sided deficits as a code stroke. MRI demonstrated acute Right Basal ganglia infarcts and remote left PCA and R occipital cortex infarcts. PMH of  hypertension, hyperlipidemia, TIA, CVA, amyloid angiopathy, paroxysmal SVT status post ablation, complete heart block status post pacemaker, secondary parkinsonism, dementia, depression, OSA, gout, BPH, glaucoma   PT Comments  Pt in bed upon arrival with wife present and agreeable to PT session. Pt was initially more responsive with ability to respond to questions with a few words. As session progressed, pt kept eyes closed and intermittently followed single step commands. Pt had improved initiation in today's session with less assistance needed for bed mobility (MaxA). Initial left lateral lean with ModA required to maintain upright position. Progressed to CGA when able to hold onto bed frame with R hand. Unable to stand fully upright in today's session with squat-pivot performed with MaxAx2 and use of gait belt/bed pad. Continue to recommend >3hrs post acute rehab to maximize rehab potential and decrease caregiver burden. Acute PT to follow.   If plan is discharge home, recommend the following: A lot of help with walking and/or transfers;A lot of help with bathing/dressing/bathroom;Assistance with cooking/housework;Direct supervision/assist for medications management;Direct supervision/assist for financial management;Assist for transportation;Help with stairs or ramp for entrance   Can travel by private vehicle      No  Equipment Recommendations  Other (comment) (TBD with progress)       Precautions / Restrictions Precautions Precautions: Fall Recall of Precautions/Restrictions: Impaired Restrictions Weight Bearing Restrictions Per Provider  Order: No     Mobility  Bed Mobility Overal bed mobility: Needs Assistance Bed Mobility: Supine to Sit    Supine to sit: Max assist    General bed mobility comments: able to assist minimally with bringing R LE towards EOB, MaxA for remainder of transfer. Initial left lateral lean requiring ModA to support    Transfers Overall transfer level: Needs assistance Equipment used: 2 person hand held assist Transfers: Bed to chair/wheelchair/BSC  Squat pivot transfers: Max assist, +2 physical assistance, +2 safety/equipment    General transfer comment: unable to clear hips with stand, MaxAx2 squat pivot with use of gait belt and bed pad. Left lateral lean throughout transfer    Modified Rankin (Stroke Patients Only) Modified Rankin (Stroke Patients Only) Pre-Morbid Rankin Score: Moderately severe disability Modified Rankin: Severe disability     Balance Overall balance assessment: Needs assistance Sitting-balance support: Feet supported, Single extremity supported Sitting balance-Leahy Scale: Poor Sitting balance - Comments: ModA to CGA for left lateral lean, able to hold onto foot board with R hand to maintain upright position Postural control: Left lateral lean Standing balance support: Bilateral upper extremity supported, During functional activity, Reliant on assistive device for balance Standing balance-Leahy Scale: Zero Standing balance comment: reliant on ext assist.             Communication Communication Communication: Impaired Factors Affecting Communication: Difficulty expressing self  Cognition Arousal: Alert Behavior During Therapy: Flat affect   PT - Cognitive impairments: History of cognitive impairments, Awareness, Memory, Attention, Sequencing, Problem solving, Safety/Judgement    PT - Cognition Comments: able to respond to questions at beginning of session with a few words. Kept eyes closed through remainder of session with no more verbal responses.  Intermittent following of single step commands Following commands:  Impaired Following commands impaired: Follows one step commands with increased time, Follows one step commands inconsistently    Cueing Cueing Techniques: Verbal cues, Gestural cues, Tactile cues, Visual cues  Exercises General Exercises - Upper Extremity Digit Composite Flexion: AAROM, Left, 5 reps, Seated Composite Extension: AAROM, Left, 5 reps, Seated General Exercises - Lower Extremity Ankle Circles/Pumps: AAROM, Both, 10 reps, Seated, PROM (L LE PROM) Long Arc Quad: AROM, AAROM, PROM, Both, Other reps (comment), Seated (R LE 40 reps AROM, L LE PROM progressing to AAROM (15)) Hip Flexion/Marching: AAROM, PROM, Both, 10 reps, Seated (L PROM)    General Comments General comments (skin integrity, edema, etc.): Wife present and supportive during session. Educated on keeping L UE elevated and maintaining awareness of L UE      Pertinent Vitals/Pain Pain Assessment Pain Assessment: No/denies pain     PT Goals (current goals can now be found in the care plan section) Acute Rehab PT Goals PT Goal Formulation: Patient unable to participate in goal setting Time For Goal Achievement: 01/14/24 Potential to Achieve Goals: Good Progress towards PT goals: Progressing toward goals    Frequency    Min 2X/week              AM-PAC PT 6 Clicks Mobility   Outcome Measure  Help needed turning from your back to your side while in a flat bed without using bedrails?: A Lot Help needed moving from lying on your back to sitting on the side of a flat bed without using bedrails?: A Lot Help needed moving to and from a bed to a chair (including a wheelchair)?: Total Help needed standing up from a chair using your arms (e.g., wheelchair or bedside chair)?: Total Help needed to walk in hospital room?: Total Help needed climbing 3-5 steps with a railing? : Total 6 Click Score: 8    End of Session Equipment Utilized During  Treatment: Gait belt Activity Tolerance: Patient tolerated treatment well Patient left: in chair;with call bell/phone within reach;with chair alarm set;with family/visitor present Nurse Communication: Mobility status;Need for lift equipment PT Visit Diagnosis: Unsteadiness on feet (R26.81);Other abnormalities of gait and mobility (R26.89);Muscle weakness (generalized) (M62.81);Hemiplegia and hemiparesis Hemiplegia - Right/Left: Left Hemiplegia - caused by: Cerebral infarction     Time: 8465-8397 PT Time Calculation (min) (ACUTE ONLY): 28 min  Charges:    $Therapeutic Exercise: 8-22 mins $Therapeutic Activity: 8-22 mins PT General Charges $$ ACUTE PT VISIT: 1 Visit                     Kate ORN, PT, DPT Secure Chat Preferred  Rehab Office [phone]   Kate BRAVO Wendolyn 01/01/2024, 4:36 PM

## 2024-01-01 NOTE — Progress Notes (Addendum)
 PROGRESS NOTE  Dustin Hamilton FMW:[id number] DOB: [birth year] DOA: 12/30/2023 PCP: [facility]   LOS: 1 day   Brief Narrative / Interim history: 83 year old male with HTN, HLD, TIA, prior CVA, amyloid angiopathy, paroxysmal SVT status post ablation, CAD 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  When he woke up on the day of admission had left-sided facial droop, weakness, right gaze deviation and was unable to speak.  At baseline he is confused but walking, able to feed self and have short conversation per his wife  Subjective / 24h Interval events: His eyes are open, minimally communicative, does not engage much in conversation.  Assesement and Plan: Principal Problem:   Focal neurological deficit Active Problems:   Essential hypertension   History of TIA (transient ischemic attack) and stroke   Benign prostatic hyperplasia with urinary obstruction   Paroxysmal SVT (supraventricular tachycardia) (HCC)   Complete heart block (HCC)   Dementia (HCC)   Glaucoma   Mixed hyperlipidemia   Obstructive sleep apnea   Secondary parkinsonism, unspecified (HCC)   Status cardiac pacemaker   Principal problem Acute stroke-MRI on admission with acute infarct in the right basal ganglia and remote left PCA territory infarcts, additional small remote infarct in the right occipital cortex within the right PCA territory.  Neurology consulted and followed patient while hospitalized.  CT angiogram with moderate stenosis of the distal M1 segment of the right MCA.  2D echo showed LVEF 55 to 60%, normal RV.  LDL is 67, A1c is 5.5.  Failed SLP, place a coretrack today for feeding - stable to discharge to inpatient rehab  Active problems HTN-permissive hypertension  History of TIA/prior CVAs-noted  Paroxysmal SVT-status post ablation  CHB-status post pacemaker  Dementia, parkinsonism-continue home  medications  BPH-continue dutasteride   Goals of care-discussed extensively at bedside goals of care with the patient's wife as well as sister, regarding future needs, especially with regards to his nutrition.  They will think about it, but are favoring to do a feeding tube  Scheduled Meds:  aspirin   81 mg Oral Daily   clopidogrel   75 mg Oral Daily   dutasteride   0.5 mg Oral Daily   magnesium  oxide  800 mg Oral Once   melatonin  3 mg Oral QHS   memantine   10 mg Oral BID   potassium chloride   40 mEq Oral Once   sodium chloride  flush  3 mL Intravenous Q12H   Continuous Infusions:   PRN Meds:.acetaminophen  **OR** acetaminophen , polyethylene glycol  Current Outpatient Medications  Medication Instructions   acetaminophen  (TYLENOL ) 650 mg, Oral, 3 times daily PRN   allopurinol  (ZYLOPRIM ) 100 mg, Oral, Daily   amLODipine  (NORVASC ) 5 mg, Daily   aspirin  81 mg, Oral, Daily   brimonidine  (ALPHAGAN ) 0.2 % ophthalmic solution 1 drop, Both Eyes, 2 times daily   colchicine  0.6 mg, Oral, Daily   dorzolamide  (TRUSOPT ) 2 % ophthalmic solution 1 drop, 2 times daily   dutasteride  (AVODART ) 0.5 mg, Every morning   galantamine (RAZADYNE) 8 mg, Oral, Daily, Takes along with memantine    latanoprost  (XALATAN ) 0.005 % ophthalmic solution 1 drop, Daily at bedtime   melatonin 3 mg, Daily at bedtime   memantine  (NAMENDA ) 10 mg, 2 times daily   tamsulosin  (FLOMAX ) 0.4 mg, Oral, Daily    Diet Orders (From admission, onward)     Start     Ordered   12/30/23 1044  Diet NPO time specified  Diet effective  now        12/30/23 1044            DVT prophylaxis: SCDs Start: 12/30/23 1603   Lab Results  Component Value Date   PLT 146 (L) 12/31/2023      Code Status: Full Code  Family Communication: No family at bedside  Status is: Observation The patient will require care spanning > 2 midnights and should be moved to inpatient because: Remains with persistent deficits, n.p.o.   Level of  care: Telemetry Medical  Consultants:  Neurology  Objective: Vitals:   12/31/23 1924 12/31/23 2329 01/01/24 0256 01/01/24 0723  BP: (!) 157/84 (!) 155/87 (!) 149/88 (!) 155/83  Pulse: 60 (!) 59 60 (!) 59  Resp: 18 18 18 19   Temp: 98.6 F (37 C) 98.3 F (36.8 C) 97.6 F (36.4 C) 97.6 F (36.4 C)  TempSrc: Oral Oral Oral Oral  SpO2: 98% 99% 97% 97%  Weight:      Height:        Intake/Output Summary (Last 24 hours) at 01/01/2024 1030 Last data filed at 01/01/2024 0900 Gross per 24 hour  Intake 3 ml  Output 700 ml  Net -697 ml   Wt Readings from Last 3 Encounters:  12/31/23 67.6 kg  11/10/23 68.8 kg  10/28/23 68.8 kg    Examination:  Constitutional: NAD Eyes: lids and conjunctivae normal, no scleral icterus ENMT: mmm Neck: normal, supple Respiratory: clear to auscultation bilaterally, no wheezing, no crackles. Normal respiratory effort.  Cardiovascular: Regular rate and rhythm, no murmurs / rubs / gallops. No LE edema. Abdomen: soft, no distention, no tenderness. Bowel sounds positive.   Data Reviewed: I have independently reviewed following labs and imaging studies   CBC Recent Labs  Lab 12/30/23 1044 12/30/23 1050 12/31/23 0822  WBC 4.7  --  8.1  HGB 11.8* 12.2* 17.3*  HCT 35.6* 36.0* 50.8  PLT 92*  --  146*  MCV 96.5  --  92.9  MCH 32.0  --  31.6  MCHC 33.1  --  34.1  RDW 13.9  --  13.5  LYMPHSABS 0.8  --   --   MONOABS 0.3  --   --   EOSABS 0.1  --   --   BASOSABS 0.0  --   --     Recent Labs  Lab 12/30/23 1044 12/30/23 1050 12/31/23 0351 12/31/23 0822  NA 144 150*  --  142  K 3.3* 2.7*  --  3.6  CL 117* 117*  --  108  CO2 23  --   --  23  GLUCOSE 86 71  --  100*  BUN 16 13  --  14  CREATININE 0.78 0.60*  --  1.03  CALCIUM  8.0*  --   --  10.1  AST 17  --   --  18  ALT 14  --   --  17  ALKPHOS 50  --   --  76  BILITOT 0.9  --   --  1.4*  ALBUMIN 2.9*  --   --  3.8  MG 1.6*  --   --   --   INR 1.2  --   --   --   HGBA1C  --   --  5.2   --     ------------------------------------------------------------------------------------------------------------------ Recent Labs    12/31/23 0351  CHOL 115  HDL 41  LDLCALC 64  TRIG 51  CHOLHDL 2.8    Lab Results  Component Value  Date   HGBA1C 5.2 12/31/2023   ------------------------------------------------------------------------------------------------------------------ No results for input(s): TSH, T4TOTAL, T3FREE, THYROIDAB in the last 72 hours.  Invalid input(s): FREET3  Cardiac Enzymes No results for input(s): CKMB, TROPONINI, MYOGLOBIN in the last 168 hours.  Invalid input(s): CK ------------------------------------------------------------------------------------------------------------------    Component Value Date/Time   BNP 106.2 (H) 03/20/2023 0312    CBG: Recent Labs  Lab 12/30/23 1045 12/30/23 1108 12/30/23 1256 12/30/23 1301  GLUCAP 66* 199* 111* 108*    Recent Results (from the past 240 hours)  SARS Coronavirus 2 by RT PCR (hospital order, performed in [facility] hospital lab) *cepheid single result test*     Status: None   Collection Time: 12/30/23 11:00 PM  Result Value Ref Range Status   SARS Coronavirus 2 by RT PCR NEGATIVE NEGATIVE Final    Comment: Performed at [facility], [address], [place], [place] [address]     Radiology Studies: No results found.    Nilda Fendt, MD, PhD [facility]  Between 7 am - 7 pm I am available, please contact me via Amion (for emergencies) or Securechat (non urgent messages)  Between 7 pm - 7 am I am not available, please contact night coverage MD/APP via Amion

## 2024-01-01 NOTE — Progress Notes (Signed)
 Speech Language Pathology Treatment: Dysphagia  Patient Details Name: Dustin Hamilton MRN: [id number] DOB: 1941-03-20 Today's Date: 01/01/2024 Time: 8960-8951 SLP Time Calculation (min) (ACUTE ONLY): 9 min  Assessment / Plan / Recommendation Clinical Impression  Pt is a little more interactive today compared to previous date but continues to have significant oral dysphagia. He initiates a swallow more often, but there are still times when he does not and oral suction is needed to remove the bolus. When he does swallow, it follows prolonged periods of oral holding. With thin liquids, there is coughing that is concerning for aspiration. With purees, there is a lot of oral residue that needs to be suctioned out. He is not ready for PO diet. Discussed with MD who has initiated GOC conversations with pt's family with Cortrak likely to be considered.    HPI HPI: Dustin Hamilton is a 83 y.o. male with medical history significant of hypertension, hyperlipidemia, TIA, CVA, amyloid angiopathy, paroxysmal SVT status post ablation, complete heart block status post pacemaker, secondary parkinsonism, dementia, depression, OSA, gout, BPH, glaucoma, chronic pain presenting with left-sided deficits as a code stroke. MRI revealed 1. Acute infarct in the right basal ganglia.  2. Remote left PCA territory infarcts and additional small remote infarcts in  the right occipital cortex within the right PCA territory.      SLP Plan  Continue with current plan of care          Recommendations  Diet recommendations: NPO Medication Administration: Via alternative means                  Oral care QID   Frequent or constant Supervision/Assistance Dysphagia, unspecified (R13.10)     Continue with current plan of care     Leita SAILOR., M.A. CCC-SLP Acute Rehabilitation Services Office: [phone]  Secure chat preferred   01/01/2024, 12:45 PM

## 2024-01-01 NOTE — Progress Notes (Signed)
 Initial Nutrition Assessment  DOCUMENTATION CODES:   Severe malnutrition in context of chronic illness  INTERVENTION:  Initiate tube feeding via cortrak when placement confirmed : Osmolite 1.5 at 20 ml/h and increase by 10 ml every 8 hours until goal of 50 ml/hr (1200 ml per day) Prosource TF20 60 ml daily 150 ml FWF Q6H   Provides 1880 kcal, 95 gm protein, 914 ml free water daily (1,514 ml water daily TF + FWF)  Monitor magnesium , potassium, and phosphorus daily for at least 3 days, MD to replete as needed, as pt is at risk for refeeding syndrome  100 mg Thiamine  x 7 days  MVI with minerals daily  Monitor for diet advancement and add ONS as appropriate  When diet advanced recommend Dysphagia 3 as pt has trouble chewing at baseline and having nursing assist with feeding during meal times  NUTRITION DIAGNOSIS:   Severe Malnutrition related to chronic illness as evidenced by severe muscle depletion, severe fat depletion.  GOAL:   Patient will meet greater than or equal to 90% of their needs  MONITOR:   TF tolerance, Diet advancement, Labs  REASON FOR ASSESSMENT:   Consult Assessment of nutrition requirement/status, Enteral/tube feeding initiation and management  ASSESSMENT:  83 y.o male who presented with left-sided deficit admitted for stroke. PMH of HTN, HLD, TIA, prior CVA, CAD with pacemaker, dementia, Parkinsons, chronic pain.  7/30: MRI- acute infarct in the right basal ganglia and remote left PCA territory infarcts, additional small remote infarct in the right occipital cortex within the right PCA territory.  7/31 - Failed bedside swallow, NPO 8/1 - Failed bedside swallow, NPO, cortrak placed, tube feeds started   Pt resting in bed with wife and sister at bedside. Pt did not communicate on visit but was able to shake his head slightly yes or no. Family reports he does better in the evening and was able to talk to them some last night.   Pt lives at home with his  wife who takes care of him and prepares all of his meals. His intake has been declining in the last couple of months. He now typically only eats a big breakfast, has an Ensure Clear for lunch and a small meal for dinner. He has trouble feeding himself as he cannot see well and needed special utensils to help him to eat. Family also reports he needed to have his meals chopped up for him because he had trouble swallowing PTA. Was able to walk some with a walker PTA.   Pt follows with an RD through the [facility] every 6 months. Since pt's intake was declining they recommended pt increase his Ensure Clear intake to twice a day and family has been complying. RD recommended trying Ensure High Protein as it is higher in calorie and protein than Ensure Clear, when on a diet and family is unsure if pt will like them as he has tried them in the past and prefers the Ensure Clear.   [name] UBW 6 months ago was 154 lbs per family. Weight history shows pt's weight has been declining since 05/2023. Going from 158 lbs to 149 lbs, 9 lbs, 5.7%  weight loss in 7 months this is not clinically significant but is concerning given overall clinical picture. Exam showed severe muscle and fat wasting. Pt is severely malnourished. He failed bedside swallow today. MD had GOC with family and they are agreeable to cortak feeding tube today. RD answered any questions family had regarding pt's nutrition and feeding  tube.    Advance tube feeds slowly as pt is at refeeding risk. Monitor lytes and add Thiamine .   Disposition: Possible admit to CIR   Dietary recall: Breakfast: eggs, bacon, sausage, toast with jelly  Lunch: Ensure Clear Dinner: Steak and potatoes Drinks 2 Ensure Clear per day, no snacks    Admit weight: 67 kg Current weight: 67.6 kg   Average Meal Intake: NPO  Nutritionally Relevant Medications: Scheduled Meds:  aspirin   81 mg Oral Daily   clopidogrel   75 mg Oral Daily   dutasteride   0.5 mg Oral Daily   feeding  supplement (PROSource TF20)  60 mL Per Tube Daily   free water  150 mL Per Tube Q6H   magnesium  oxide  800 mg Oral Once   melatonin  3 mg Oral QHS   memantine   10 mg Oral BID   multivitamin with minerals  1 tablet Oral Daily   potassium chloride   40 mEq Oral Once   sodium chloride  flush  3 mL Intravenous Q12H   thiamine   100 mg Per Tube Daily   Continuous Infusions:  feeding supplement (OSMOLITE 1.5 CAL)      Labs Reviewed: No pertinent labs  CBG ranges from 66-199 mg/dL over the last 24 hours HgbA1c 5.2  NUTRITION - FOCUSED PHYSICAL EXAM:  Flowsheet Row Most Recent Value  Orbital Region Severe depletion  Upper Arm Region Moderate depletion  Thoracic and Lumbar Region Moderate depletion  Buccal Region Severe depletion  Temple Region Severe depletion  Clavicle Bone Region Severe depletion  Clavicle and Acromion Bone Region Severe depletion  Scapular Bone Region Severe depletion  Dorsal Hand Moderate depletion  Patellar Region Severe depletion  Anterior Thigh Region Severe depletion  Posterior Calf Region Unable to assess  [leg cufffs]  Edema (RD Assessment) None  Hair Reviewed  Eyes Reviewed  [Glaucoma, cannot see well]  Mouth Reviewed  Skin Reviewed  Nails Reviewed    Diet Order:   Diet Order             Diet NPO time specified  Diet effective now                   EDUCATION NEEDS:   Education needs have been addressed  Skin:  Skin Assessment: Reviewed RN Assessment  Last BM:  PTA  Height:   Ht Readings from Last 1 Encounters:  12/31/23 5' 11 (1.803 m)    Weight:   Wt Readings from Last 1 Encounters:  12/31/23 67.6 kg    Ideal Body Weight:  78.2 kg  BMI:  Body mass index is 20.78 kg/m.  Estimated Nutritional Needs:   Kcal:  1700-1900 kcal  Protein:  80-100 gm  Fluid:  >1.7L/day   Dustin Hamilton, RD Registered Dietitian  See Amion for more information

## 2024-01-01 NOTE — Procedures (Signed)
 Cortrak  Person Inserting Tube:  Donal Mutton, RD Tube Type:  Cortrak - 43 inches Tube Size:  10 Tube Location:  Left nare Secured by: Bridle Technique Used to Measure Tube Placement:  Marking at nare/corner of mouth Cortrak Secured At:  65 cm   Cortrak Tube Team Note:  Consult received to place a Cortrak feeding tube.   Abdominal xray need prior to use. RN aware  If the tube becomes dislodged please keep the tube and contact the Cortrak team at [email or web address] for replacement.  If after hours and replacement cannot be delayed, place a NG tube and confirm placement with an abdominal x-ray.   Mutton Donal MS, RDN, LDN, CNSC Registered Dietitian 3 Clinical Nutrition RD Inpatient Contact Info in Amion

## 2024-01-01 NOTE — PMR Pre-admission (Signed)
 PMR Admission Coordinator Pre-Admission Assessment  Patient: Dustin Hamilton is an 83 y.o., male MRN: [id number] DOB: 01/03/1941 Height: 5' 11 (180.3 cm) Weight: 67.6 kg  Insurance Information HMO:     PPO:      PCP:      IPA:      80/20:      OTHER:  PRIMARY: [facility]      Policy#: [id number]       Subscriber: Pt  CM Name:       Phone#:   Approved on 12/18/23   approved 01/01/24 Benefits:  Phone #: [phone]     Name: [facility]. Date: 06/03/23     Deduct:       Out of Pocket Max:       Life Max:  CIR:       SNF:  Outpatient:      Co-Pay:  Home Health:       Co-Pay:  DME:      Co-Pay:  Providers: in network  SECONDARY: [employer]      Policy#: [id number]      Phone#:   Financial Counselor:       Phone#:   The "Data Collection Information Summary" for patients in Inpatient Rehabilitation Facilities with attached "Privacy Act Statement-Health Care Records" was provided and verbally reviewed with: Pt  Emergency Contact Information Contact Information     Name Relation Home Work Mobile   [place] Spouse [phone]  [phone]      Other Contacts     Name Relation Home Work Mobile   Minton,[name] Son   [phone]       Current Medical History  Patient Admitting Diagnosis: CVA History of Present Illness:  Dustin Hamilton is a 83 y.o. male with medical history significant of hypertension, hyperlipidemia, TIA, CVA, amyloid angiopathy, paroxysmal SVT status post ablation, complete heart block status post pacemaker, secondary parkinsonism, dementia, depression, OSA, gout, BPH, glaucoma, chronic pain who presented to [facility] on 12/30/23 with left-sided deficits as a code stroke.  Vital signs in the ED notable for blood pressure in the 120s-160s systolic.  Lab workup included CMP with potassium 3.3, chloride 117, calcium  8.0, protein 5.6, albumin 2.9.  CBC with hemoglobin 11.8 down from baseline of 15-16, platelets  92.  PT 16.3, INR normal, PTT normal.  Ethanol level negative.  Urinalysis with hemoglobin, protein, small leukocytes, rare bacteria.  CT head showed no acute abnormality. CTA head & neck New focal moderate stenosis of the distal M1 segment of the right MCA. Redemonstrated occlusion of the proximal P2 segment of the left PCA with reconstitution at the mid P2 segment. Focal moderate stenosis of the V4 segment right vertebral artery. Mild atherosclerosis at the right and left carotid bifurcations without hemodynamically significant stenosis. MRI  showed Acute infarct in the right basal ganglia. Remote left PCA territory infarcts and additional small remote infarcts in the right occipital cortex within the right PCA territory. 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 Moderate generalized parenchymal volume loss.2D Echo with EF 55-60%. Pt. Seen by PT/OT/SLP and they recommend CIR to assist return to PLOF.   Complete NIHSS TOTAL: 13  Patient's medical record from [facility]  has been reviewed by the rehabilitation admission coordinator and physician.  Past Medical History  Past Medical History:  Diagnosis Date   AMS (altered mental status) 03/15/2023   ARF (acute renal failure) (HCC) 09/30/2023   BPH (benign  prostatic hyperplasia)    Cerebral infarction (HCC) 09/13/2012   IMO SNOMED Dx Update Oct 2024     Cerebrovascular disease    Clostridium difficile diarrhea    Congenital anomaly of diaphragm    Elevated PSA    Glaucoma, both eyes    Hemorrhoid    Hepatitis B surface antigen positive    02-20-2011   History of adenomatous polyp of colon    2007, 2009 and 2013  tubular adenoma's   History of alcohol  abuse    quit 1963   History of cerebral parenchymal hemorrhage    01/ 2006  left occiptial lobe related to hypertensive crisis   History of CVA (cerebrovascular accident)    09-12-2012  left hippocampus/ amygdala  junction and per MRI old white matter infarcts--  per pt residual short- term memory issues   History of fatty infiltration of liver hx visit's at 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 Yes  Family History   family history includes Diabetes in his maternal grandmother and mother; Heart attack in his father and mother; Heart disease in his maternal grandmother; Kidney failure in his mother; Rheum arthritis in his maternal grandmother and mother; Stroke in his maternal grandmother and mother.  Current Medications  Current Facility-Administered Medications:    acetaminophen  (TYLENOL ) tablet 650 mg, 650 mg, Oral, Q6H PRN **OR** acetaminophen  (TYLENOL ) suppository 650 mg, 650 mg, Rectal, Q6H PRN, Melvin, Alexander B, MD   aspirin  chewable tablet 81 mg, 81 mg, Oral, Daily, Melvin, Alexander B, MD   clopidogrel  (PLAVIX ) tablet 75 mg, 75 mg, Oral, Daily, Remi, Devon, NP   dutasteride  (AVODART ) capsule 0.5 mg, 0.5 mg, Oral, Daily, Melvin, Alexander B, MD   magnesium  oxide (MAG-OX) tablet 800 mg, 800 mg, Oral, Once, Kommor, Madison, MD   melatonin tablet 3 mg, 3 mg, Oral, QHS, Melvin, Alexander B, MD   memantine  (NAMENDA ) tablet 10 mg, 10 mg, Oral, BID, Melvin, Alexander B, MD   polyethylene glycol (MIRALAX  / GLYCOLAX ) packet 17 g, 17 g, Oral, Daily PRN, Melvin, Alexander B, MD   potassium chloride  SA (KLOR-CON  M) CR tablet 40 mEq, 40 mEq, Oral, Once, Kommor, Madison, MD   sodium chloride  flush (NS) 0.9 %  injection 3 mL, 3 mL, Intravenous, Q12H, Melvin, Alexander B, MD, 3 mL at 12/31/23 2223  Patients Current Diet:  Diet Order             Diet NPO time specified  Diet effective now                   Precautions / Restrictions Precautions Precautions: Fall Restrictions Weight Bearing Restrictions Per Provider Order: No   Has the patient had 2 or more falls or a fall with injury in the past year? Yes  Prior Activity Level Community (5-7x/wk): Pt. active in the communtiy  Prior Functional Level  Self Care: Did the patient need help bathing, dressing, using the toilet or eating? Needed some help  Indoor Mobility: Did the patient need assistance with walking from room to room (with or without device)? Independent  Stairs: Did the patient need assistance with internal or external stairs (with or without device)? Dependent  Functional Cognition: Did the patient need help planning regular tasks such as shopping or remembering to take medications? Dependent  Patient Information Are you of Hispanic, Latino/a,or Spanish origin?: A. No, not of Hispanic, Latino/a, or Spanish origin What is your race?: B. Black or African American Do you need or want an interpreter to communicate with a doctor or health care staff?: 0. No Patient information obtained via proxy : wife  Patient's Response To:  Health Literacy and Transportation Is the patient able to respond to health literacy and transportation needs?: No Health Literacy - How often do you need to have someone help you when you read instructions, pamphlets, or other written material from your doctor or pharmacy?: Patient unable to respond In the past 12 months, has lack of transportation kept you from medical appointments or from getting medications?: No In the past 12 months, has lack of transportation kept you from meetings, work, or from getting things needed for daily living?: No Health Literacy and Transportation obtained via  proxy: wife  Journalist, newspaper / Equipment Home Equipment: Grab bars - toilet, Grab bars - tub/shower, BSC/3in1, Hand held shower head, Cane - single point, Rollator (4 wheels)  Prior Device Use: Indicate devices/aids used by the patient prior to current illness, exacerbation or injury? Walker  Current Functional Level Cognition  Arousal/Alertness: Awake/alert Overall Cognitive Status: Impaired/Different from baseline Orientation Level: Oriented to person, Oriented to place, Disoriented to time, Disoriented to situation Attention: Sustained Sustained Attention: Impaired Sustained Attention Impairment: Functional basic, Verbal basic Problem Solving: Impaired Problem Solving Impairment: Functional basic    Extremity Assessment (includes Sensation/Coordination)  Upper Extremity Assessment: Defer to OT evaluation RUE Deficits / Details: limited mobility, fair grasp able to maintain on stedy. Elbow flexion/ext limited with shoulder movement very slight ROM ~10 deg. LUE Deficits / Details: very limited grasp with LUE, weaker than R and inconsistent. trace elbow flexion no elbow ext, strong flexor synergy pattern. Able to retract scapula, scapula tight with notable protracted position. 3/4 elbow flex tone LUE Sensation: WNL (per report) LUE Coordination: decreased fine motor, decreased gross motor  Lower Extremity Assessment: RLE deficits/detail, LLE deficits/detail RLE Deficits / Details: At least 3/5, unable to formally MMT due to cognition LLE Deficits / Details: Able to wiggle toes and contract L quad, WFL PROM    ADLs  Grooming: Maximal assistance, Sitting Grooming Details (indicate cue type and reason): max A for thoroughness, initiated with hand over hand assist and placement of cloth in hand. Able to bring washcloth to face with light AAROM using RUE. General ADL Comments: Total A for all other ADLs.    Mobility  Overal bed mobility: Needs Assistance Bed Mobility: Supine to  Sit Supine to sit: Total assist, +2 for safety/equipment, +2 for physical assistance, Used rails General bed mobility comments: cues and no initiation of bed mobility.    Transfers  Overall transfer level: Needs assistance Equipment used: 2 person hand held assist, Ambulation equipment used Transfers: Sit to/from Stand, Bed to chair/wheelchair/BSC Sit to Stand: Max assist, +2 physical assistance, +2 safety/equipment Bed to/from chair/wheelchair/BSC transfer type:: Via Lift equipment Stand pivot transfers: Max assist, +2 physical assistance, +2 safety/equipment Transfer via  Lift Equipment: Stedy General transfer comment: able to stand with MaxAx2 and [employer] with difficulty maintaining upright posture. Transfer to recliner with Stedy, assist needed to bring hands to stedy handle. Able to slightly pull into standing with MaxAx2 to complete. Slight left lateral lean    Ambulation / Gait / Stairs / Wheelchair Mobility  Ambulation/Gait General Gait Details: unable this date    Posture / Balance Dynamic Sitting Balance Sitting balance - Comments: at some periods CGA, began leaning L and anteriorly with fatigue Balance Overall balance assessment: Needs assistance Sitting-balance support: Feet supported Sitting balance-Leahy Scale: Poor Sitting balance - Comments: at some periods CGA, began leaning L and anteriorly with fatigue Postural control: Left lateral lean Standing balance-Leahy Scale: Zero Standing balance comment: reliant on ext assist.    Special considerations/life events  Skin intact and Special service needs none   Previous Home Environment (from acute therapy documentation) Living Arrangements: Spouse/significant other Available Help at Discharge: Family, Available 24 hours/day Type of Home: House Home Layout: One level Home Access: Ramped entrance Bathroom Shower/Tub: Health visitor: Standard Bathroom Accessibility: Yes Home Care Services: Yes Type of Home  Care Services: Homehealth aide Additional Comments: Aide x3 hrs 4day/wk. Pt with limited communication, PLOF and setup from past notes  Discharge Living Setting Plans for Discharge Living Setting: House Type of Home at Discharge: House Discharge Home Layout: One level Discharge Home Access: Ramped entrance Discharge Bathroom Shower/Tub: Walk-in shower Discharge Bathroom Toilet: Standard Discharge Bathroom Accessibility: Yes How Accessible: Accessible via walker Does the patient have any problems obtaining your medications?: No  Social/Family/Support Systems Patient Roles: Spouse Contact Information: [phone] Anticipated Caregiver: Wife can do supervision, Neice and nephew can assist, has aide 3 hrs 4 days a week Caregiver Availability: 24/7 Discharge Plan Discussed with Primary Caregiver: Yes Is Caregiver In Agreement with Plan?: No Does Caregiver/Family have Issues with Lodging/Transportation while Pt is in Rehab?: No  Goals Patient/Family Goal for Rehab: PT/OT/SLP Min-mod A Expected length of stay: 18-21 days Pt/Family Agrees to Admission and willing to participate: Yes Program Orientation Provided & Reviewed with Pt/Caregiver Including Roles  & Responsibilities: Yes  Decrease burden of Care through IP rehab admission: Not anticipated  Possible need for SNF placement upon discharge: Not anticipated  Patient Condition: I have reviewed medical records from [facility] , spoken with CM, and patient, spouse, and family member. I met with patient at the bedside for inpatient rehabilitation assessment.  Patient will benefit from ongoing PT, OT, and SLP, can actively participate in 3 hours of therapy a day 5 days of the week, and can make measurable gains during the admission.  Patient will also benefit from the coordinated team approach during an Inpatient Acute Rehabilitation admission.  The patient will receive intensive therapy as well as Rehabilitation physician,  nursing, social worker, and care management interventions.  Due to safety, skin/wound care, disease management, medication administration, pain management, and patient education the patient requires 24 hour a day rehabilitation nursing.  The patient is currently Max A of 2  with mobility and basic ADLs.  Discharge setting and therapy post discharge at home with home health is anticipated.  Patient has agreed to participate in the Acute Inpatient Rehabilitation Program and will admit today.  Preadmission Screen Completed By:  Leita KATHEE Kleine, 01/01/2024 1:22 PM ______________________________________________________________________   Discussed status with Dr. Carilyn  on 01/04/24 at 1036 and received approval for admission today.  Admission Coordinator:  Leita KATHEE Kleine, CCC-SLP, time 1036/Date  01/04/24   Assessment/Plan: Diagnosis: RIght basal ganglia infarcts Does the need for close, 24 hr/day Medical supervision in concert with the patient's rehab needs make it unreasonable for this patient to be served in a less intensive setting? Yes Co-Morbidities requiring supervision/potential complications: parkinsonism, severe dysphagia NPO Due to bladder management, bowel management, safety, skin/wound care, disease management, medication administration, pain management, and patient education, does the patient require 24 hr/day rehab nursing? Yes Does the patient require coordinated care of a physician, rehab nurse, PT, OT, and SLP to address physical and functional deficits in the context of the above medical diagnosis(es)? Yes Addressing deficits in the following areas: balance, endurance, locomotion, strength, transferring, bowel/bladder control, bathing, dressing, feeding, grooming, toileting, cognition, and psychosocial support Can the patient actively participate in an intensive therapy program of at least 3 hrs of therapy 5 days a week? Yes The potential for patient to make measurable gains while on  inpatient rehab is fair Anticipated functional outcomes upon discharge from inpatient rehab: mod assist PT, mod assist OT, mod assist SLP Estimated rehab length of stay to reach the above functional goals is: 21-24d Anticipated discharge destination: Home with assist vs SNF 10. Overall Rehab/Functional Prognosis: fair   MD Signature: Prentice CHARLENA Compton M.D. [facility] Fellow Am Acad of Phys Med and Rehab Diplomate Am Board of Electrodiagnostic Med Fellow Am Board of Interventional Pain

## 2024-01-02 ENCOUNTER — Inpatient Hospital Stay (HOSPITAL_COMMUNITY)

## 2024-01-02 DIAGNOSIS — R0603 Acute respiratory distress: Secondary | ICD-10-CM | POA: Diagnosis not present

## 2024-01-02 DIAGNOSIS — J9811 Atelectasis: Secondary | ICD-10-CM | POA: Diagnosis not present

## 2024-01-02 DIAGNOSIS — I959 Hypotension, unspecified: Secondary | ICD-10-CM | POA: Diagnosis not present

## 2024-01-02 DIAGNOSIS — Z950 Presence of cardiac pacemaker: Secondary | ICD-10-CM | POA: Diagnosis not present

## 2024-01-02 DIAGNOSIS — R29818 Other symptoms and signs involving the nervous system: Secondary | ICD-10-CM | POA: Diagnosis not present

## 2024-01-02 LAB — COMPREHENSIVE METABOLIC PANEL WITH GFR
ALT: 27 U/L (ref 0–44)
ALT: 31 U/L (ref 0–44)
AST: 33 U/L (ref 15–41)
AST: 34 U/L (ref 15–41)
Albumin: 3.4 g/dL — ABNORMAL LOW (ref 3.5–5.0)
Albumin: 3.5 g/dL (ref 3.5–5.0)
Alkaline Phosphatase: 61 U/L (ref 38–126)
Alkaline Phosphatase: 63 U/L (ref 38–126)
Anion gap: 9 (ref 5–15)
Anion gap: 10 (ref 5–15)
BUN: 23 mg/dL (ref 8–23)
BUN: 24 mg/dL — ABNORMAL HIGH (ref 8–23)
CO2: 24 mmol/L (ref 22–32)
CO2: 25 mmol/L (ref 22–32)
Calcium: 9.9 mg/dL (ref 8.9–10.3)
Calcium: 10.1 mg/dL (ref 8.9–10.3)
Chloride: 111 mmol/L (ref 98–111)
Chloride: 109 mmol/L (ref 98–111)
Creatinine, Ser: 1.24 mg/dL (ref 0.61–1.24)
Creatinine, Ser: 1.36 mg/dL — ABNORMAL HIGH (ref 0.61–1.24)
GFR, Estimated: 58 mL/min — ABNORMAL LOW (ref >60)
GFR, Estimated: 52 mL/min — ABNORMAL LOW (ref >60)
Glucose, Bld: 156 mg/dL — ABNORMAL HIGH (ref 70–99)
Glucose, Bld: 137 mg/dL — ABNORMAL HIGH (ref 70–99)
Potassium: 3.6 mmol/L (ref 3.5–5.1)
Potassium: 3.7 mmol/L (ref 3.5–5.1)
Sodium: 144 mmol/L (ref 135–145)
Sodium: 144 mmol/L (ref 135–145)
Total Bilirubin: 1.5 mg/dL — ABNORMAL HIGH (ref 0.0–1.2)
Total Bilirubin: 1.4 mg/dL — ABNORMAL HIGH (ref 0.0–1.2)
Total Protein: 6.8 g/dL (ref 6.5–8.1)
Total Protein: 7.1 g/dL (ref 6.5–8.1)

## 2024-01-02 LAB — CBC
HCT: 47.6 % (ref 39.0–52.0)
HCT: 54.5 % — ABNORMAL HIGH (ref 39.0–52.0)
Hemoglobin: 16 g/dL (ref 13.0–17.0)
Hemoglobin: 18.5 g/dL — ABNORMAL HIGH (ref 13.0–17.0)
MCH: 31.7 pg (ref 26.0–34.0)
MCH: 32.3 pg (ref 26.0–34.0)
MCHC: 33.6 g/dL (ref 30.0–36.0)
MCHC: 33.9 g/dL (ref 30.0–36.0)
MCV: 94.3 fL (ref 80.0–100.0)
MCV: 95.1 fL (ref 80.0–100.0)
Platelets: 146 K/uL — ABNORMAL LOW (ref 150–400)
Platelets: 113 K/uL — ABNORMAL LOW (ref 150–400)
RBC: 5.05 MIL/uL (ref 4.22–5.81)
RBC: 5.73 MIL/uL (ref 4.22–5.81)
RDW: 13.5 % (ref 11.5–15.5)
RDW: 13.6 % (ref 11.5–15.5)
WBC: 9.3 K/uL (ref 4.0–10.5)
WBC: 7.3 K/uL (ref 4.0–10.5)
nRBC: 0 % (ref 0.0–0.2)
nRBC: 0 % (ref 0.0–0.2)

## 2024-01-02 LAB — LACTIC ACID, PLASMA
Lactic Acid, Venous: 2.5 mmol/L (ref 0.5–1.9)
Lactic Acid, Venous: 2.5 mmol/L (ref 0.5–1.9)

## 2024-01-02 LAB — GLUCOSE, CAPILLARY
Glucose-Capillary: 138 mg/dL — ABNORMAL HIGH (ref 70–99)
Glucose-Capillary: 114 mg/dL — ABNORMAL HIGH (ref 70–99)
Glucose-Capillary: 131 mg/dL — ABNORMAL HIGH (ref 70–99)
Glucose-Capillary: 99 mg/dL (ref 70–99)
Glucose-Capillary: 153 mg/dL — ABNORMAL HIGH (ref 70–99)

## 2024-01-02 LAB — MAGNESIUM: Magnesium: 2.1 mg/dL (ref 1.7–2.4)

## 2024-01-02 LAB — TROPONIN I (HIGH SENSITIVITY): Troponin I (High Sensitivity): 17 ng/L (ref <18)

## 2024-01-02 MED ORDER — SODIUM CHLORIDE 0.9 % IV BOLUS
1000.0000 mL | Freq: Once | INTRAVENOUS | Status: AC
  Administered 2024-01-02: 1000 mL via INTRAVENOUS

## 2024-01-02 MED ORDER — SODIUM CHLORIDE 0.9 % IV SOLN: INTRAVENOUS | Status: AC

## 2024-01-02 NOTE — Progress Notes (Signed)
 Overnight floor coverage  Informed by RN that patient is hypotensive and less responsive. Current vital signs: Temperature 98.2 F, pulse 63, respiratory rate 18, blood pressure 86/61, and SpO2 94% on room air.  CBG in the 160s.  Patient seen and examined at bedside.  He does not speak or open his eyes but does follow commands.  Not able to move his left upper and lower extremity but does move his right side.  He is able to squeeze fingers with his right hand and able to wiggle toes of his right lower extremity.  Antihypertensives were held on admission due to acute stroke.  1 L normal saline bolus ordered and started on maintenance IV fluids for hypotension, continue to monitor blood pressure very closely.  Stat CT head, EKG, chest x-ray, CBC, metabolic panel, and lactate ordered.  Discussed with neurology and suspect change in his mental status is related to hypotension but neurology agrees with repeating CT head to rule out hemorrhagic conversion.

## 2024-01-02 NOTE — Progress Notes (Signed)
 PROGRESS NOTE  ADOM SCHOENECK FMW:[id number] DOB: [birth year] DOA: 12/30/2023 PCP: [facility]   LOS: 2 days   Brief Narrative / Interim history: 83 year old male with HTN, HLD, TIA, prior CVA, amyloid angiopathy, paroxysmal SVT status post ablation, CAD 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  When he woke up on the day of admission had left-sided facial droop, weakness, right gaze deviation and was unable to speak.  At baseline he is confused but walking, able to feed self and have short conversation per his wife  Subjective / 24h Interval events: Events earlier this morning noted, had 1 episode of hypotension.  Received fluid bolus, when I evaluated patient blood pressure is already improved, and he is at baseline  Assesement and Plan: Principal Problem:   Focal neurological deficit Active Problems:   Essential hypertension   History of TIA (transient ischemic attack) and stroke   Benign prostatic hyperplasia with urinary obstruction   Paroxysmal SVT (supraventricular tachycardia) (HCC)   Complete heart block (HCC)   Dementia (HCC)   Glaucoma   Mixed hyperlipidemia   Obstructive sleep apnea   Secondary parkinsonism, unspecified (HCC)   Status cardiac pacemaker   Protein-calorie malnutrition, severe   Principal problem Acute stroke-MRI on admission with acute infarct in the right basal ganglia and remote left PCA territory infarcts, additional small remote infarct in the right occipital cortex within the right PCA territory.  Neurology consulted and followed patient while hospitalized.  CT angiogram with moderate stenosis of the distal M1 segment of the right MCA.  2D echo showed LVEF 55 to 60%, normal RV.  LDL is 67, A1c is 5.5.  Failed SLP, now has a core track.  If he does not improve we will need to convert to a PEG tube over the next week or 2 - stable to discharge to inpatient rehab,  awaiting insurance authorization  Active problems HTN-allow permissive hypertension, he was actually hypotensive this morning but quickly rebounded.  History of TIA/prior CVAs-noted  Paroxysmal SVT-status post ablation  CHB-status post pacemaker  Dementia, parkinsonism-continue home medications  BPH-continue dutasteride   Goals of care-discussed extensively at bedside goals of care with the patient's wife as well as sister, regarding future needs, especially with regards to his nutrition.  They will think about it, but are favoring to do a feeding tube  Scheduled Meds:  aspirin   81 mg Oral Daily   clopidogrel   75 mg Oral Daily   dutasteride   0.5 mg Oral Daily   feeding supplement (PROSource TF20)  60 mL Per Tube Daily   free water   150 mL Per Tube Q6H   magnesium  oxide  800 mg Oral Once   melatonin  3 mg Oral QHS   memantine   10 mg Oral BID   multivitamin with minerals  1 tablet Oral Daily   potassium chloride   40 mEq Oral Once   sodium chloride  flush  3 mL Intravenous Q12H   thiamine   100 mg Per Tube Daily   Continuous Infusions:  sodium chloride  125 mL/hr at 01/02/24 9376   feeding supplement (OSMOLITE 1.5 CAL) 1,000 mL (01/01/24 1702)    PRN Meds:.acetaminophen  **OR** acetaminophen , polyethylene glycol  Current Outpatient Medications  Medication Instructions   acetaminophen  (TYLENOL ) 650 mg, Oral, 3 times daily PRN   allopurinol  (ZYLOPRIM ) 100 mg, Oral, Daily   amLODipine  (NORVASC ) 5 mg, Daily   aspirin  81 mg, Oral, Daily   brimonidine  (ALPHAGAN ) 0.2 % ophthalmic solution  1 drop, Both Eyes, 2 times daily   colchicine  0.6 mg, Oral, Daily   dorzolamide  (TRUSOPT ) 2 % ophthalmic solution 1 drop, 2 times daily   dutasteride  (AVODART ) 0.5 mg, Every morning   galantamine  (RAZADYNE ) 8 mg, Oral, Daily, Takes along with memantine    latanoprost  (XALATAN ) 0.005 % ophthalmic solution 1 drop, Daily at bedtime   melatonin 3 mg, Daily at bedtime   memantine  (NAMENDA ) 10 mg, 2  times daily   tamsulosin  (FLOMAX ) 0.4 mg, Oral, Daily    Diet Orders (From admission, onward)     Start     Ordered   12/30/23 1044  Diet NPO time specified  Diet effective now        12/30/23 1044            DVT prophylaxis: SCDs Start: 12/30/23 1603   Lab Results  Component Value Date   PLT 113 (L) 01/02/2024      Code Status: Full Code  Family Communication: No family at bedside  Status is: Inpatient   Level of care: Telemetry Medical  Consultants:  Neurology  Objective: Vitals:   01/02/24 0342 01/02/24 0348 01/02/24 0628 01/02/24 0745  BP: (!) 78/55 (!) 86/61 (!) 143/73 (!) 140/81  Pulse: 67 63 62 60  Resp: 18 18 20    Temp: 98.5 F (36.9 C) 98.2 F (36.8 C) 98.8 F (37.1 C) 98.1 F (36.7 C)  TempSrc: Oral Oral Oral Oral  SpO2: 95% 94% 96% 99%  Weight:      Height:        Intake/Output Summary (Last 24 hours) at 01/02/2024 0825 Last data filed at 01/02/2024 0800 Gross per 24 hour  Intake 456 ml  Output 1050 ml  Net -594 ml   Wt Readings from Last 3 Encounters:  12/31/23 67.6 kg  11/10/23 68.8 kg  10/28/23 68.8 kg    Examination:  Constitutional: NAD Eyes: lids and conjunctivae normal, no scleral icterus ENMT: mmm Neck: normal, supple Respiratory: clear to auscultation bilaterally, no wheezing, no crackles. Normal respiratory effort.  Cardiovascular: Regular rate and rhythm, no murmurs / rubs / gallops. No LE edema. Abdomen: soft, no distention, no tenderness. Bowel sounds positive.  Skin: no rashes   Data Reviewed: I have independently reviewed following labs and imaging studies   CBC Recent Labs  Lab 12/30/23 1044 12/30/23 1050 12/31/23 0822 01/02/24 0258 01/02/24 0510  WBC 4.7  --  8.1 9.3 7.3  HGB 11.8* 12.2* 17.3* 16.0 18.5*  HCT 35.6* 36.0* 50.8 47.6 54.5*  PLT 92*  --  146* 146* 113*  MCV 96.5  --  92.9 94.3 95.1  MCH 32.0  --  31.6 31.7 32.3  MCHC 33.1  --  34.1 33.6 33.9  RDW 13.9  --  13.5 13.5 13.6  LYMPHSABS 0.8   --   --   --   --   MONOABS 0.3  --   --   --   --   EOSABS 0.1  --   --   --   --   BASOSABS 0.0  --   --   --   --     Recent Labs  Lab 12/30/23 1044 12/30/23 1050 12/31/23 0351 12/31/23 0822 01/02/24 0258 01/02/24 0510  NA 144 150*  --  142 144 144  K 3.3* 2.7*  --  3.6 3.6 3.7  CL 117* 117*  --  108 111 109  CO2 23  --   --  23 24 25   GLUCOSE  86 71  --  100* 156* 137*  BUN 16 13  --  14 23 24*  CREATININE 0.78 0.60*  --  1.03 1.24 1.36*  CALCIUM  8.0*  --   --  10.1 9.9 10.1  AST 17  --   --  18 33 34  ALT 14  --   --  17 27 31   ALKPHOS 50  --   --  76 61 63  BILITOT 0.9  --   --  1.4* 1.5* 1.4*  ALBUMIN 2.9*  --   --  3.8 3.4* 3.5  MG 1.6*  --   --   --  2.1  --   LATICACIDVEN  --   --   --   --   --  2.5*  INR 1.2  --   --   --   --   --   HGBA1C  --   --  5.2  --   --   --     ------------------------------------------------------------------------------------------------------------------ Recent Labs    12/31/23 0351  CHOL 115  HDL 41  LDLCALC 64  TRIG 51  CHOLHDL 2.8    Lab Results  Component Value Date   HGBA1C 5.2 12/31/2023   ------------------------------------------------------------------------------------------------------------------ No results for input(s): TSH, T4TOTAL, T3FREE, THYROIDAB in the last 72 hours.  Invalid input(s): FREET3  Cardiac Enzymes No results for input(s): CKMB, TROPONINI, MYOGLOBIN in the last 168 hours.  Invalid input(s): CK ------------------------------------------------------------------------------------------------------------------    Component Value Date/Time   BNP 106.2 (H) 03/20/2023 0312    CBG: Recent Labs  Lab 12/30/23 1301 01/01/24 2022 01/01/24 2349 01/02/24 0351 01/02/24 0746  GLUCAP 108* 111* 132* 138* 114*    Recent Results (from the past 240 hours)  SARS Coronavirus 2 by RT PCR (hospital order, performed in [facility] hospital lab) *cepheid single result test*      Status: None   Collection Time: 12/30/23 11:00 PM  Result Value Ref Range Status   SARS Coronavirus 2 by RT PCR NEGATIVE NEGATIVE Final    Comment: Performed at [facility], [address] [place], [place] [address]     Radiology Studies: CT HEAD WO CONTRAST ( ) Result Date: 01/02/2024 CLINICAL DATA:  83 year old male with change in mental status. Delirium. Status post code stroke presentation on 12/30/2023, with MRI revealing right basal ganglia infarct. EXAM: CT HEAD WITHOUT CONTRAST TECHNIQUE: Contiguous axial images were obtained from the base of the skull through the vertex without intravenous contrast. RADIATION DOSE REDUCTION: This exam was performed according to the departmental dose-optimization program which includes automated exposure control, adjustment of the mA and/or kV according to patient size and/or use of iterative reconstruction technique. COMPARISON:  Brain MRI, head CT, CTA 12/30/2023. FINDINGS: Brain: Cytotoxic edema now evident throughout the right basal ganglia (series 3, image 19). No hemorrhagic transformation. Other chronically advanced cerebral white matter hypodensity, areas of cortical encephalomalacia in the left parietal and occipital lobes appears stable. No midline shift, mass effect, or evidence of intracranial mass lesion. Stable ventricle size and configuration. No acute intracranial hemorrhage identified. Vascular: Calcified atherosclerosis at the skull base. No suspicious intracranial vascular hyperdensity. Skull: Stable and intact. Sinuses/Orbits: Left nasoenteric tube now in place. Visualized paranasal sinuses and mastoids are stable and well aerated. Other: Stable orbit and scalp soft tissues. IMPRESSION: 1. Expected evolution of the Right Basal Ganglia cytotoxic edema since 12/30/2023. No hemorrhagic transformation or intracranial mass effect. 2. Underlying advanced chronic ischemic disease. No new intracranial abnormality. Electronically Signed  By: VEAR Hurst M.D.   On: 01/02/2024 06:30   DG CHEST PORT 1 VIEW Result Date: 01/02/2024 EXAM: 1 VIEW XRAY OF THE CHEST 01/02/2024 05:17:00 AM COMPARISON: 11/10/2023 CLINICAL HISTORY: Hypotension. Respiratory distress. FINDINGS: LUNGS AND PLEURA: Mild subsegmental atelectasis in the right base. No focal pulmonary opacity. No pulmonary edema. No pleural effusion. No pneumothorax. HEART AND MEDIASTINUM: There is a left chest wall pacer device with lead in the right atrial appendage and right ventricle. No acute abnormality of the cardiac and mediastinal silhouettes. BONES AND SOFT TISSUES: Unchanged Asymmetric elevation of the right hemidiaphragm. No acute osseous abnormality. LINES AND TUBES: Feeding tube drip projects over the anterolateral pyloric junction within the right upper quadrant of the abdomen. IMPRESSION: 1. Mild subsegmental atelectasis in the right base. Electronically signed by: Waddell Calk MD 01/02/2024 05:55 AM EDT RP Workstation: [id number]   DG Abd Portable 1V Result Date: 01/01/2024 CLINICAL DATA:  Enteric tube placement EXAM: PORTABLE ABDOMEN - 1 VIEW COMPARISON:  CT abdomen and pelvis dated 04/24/2023 FINDINGS: Gastric/enteric tube tip projects over the distal stomach/proximal duodenum. IMPRESSION: Gastric/enteric tube tip projects over the distal stomach/proximal duodenum. Electronically Signed   By: Limin  Xu M.D.   On: 01/01/2024 15:24      Nilda Fendt, MD, PhD [facility]  Between 7 am - 7 pm I am available, please contact me via Amion (for emergencies) or Securechat (non urgent messages)  Between 7 pm - 7 am I am not available, please contact night coverage MD/APP via Amion

## 2024-01-02 NOTE — Progress Notes (Signed)
 CBG 156

## 2024-01-02 NOTE — Progress Notes (Signed)
 Physical Therapy Treatment Patient Details Name: Dustin Hamilton MRN: [id number] DOB: 08-31-1940 Today's Date: 01/02/2024   History of Present Illness Pt is an 83 yo male presenting to [facility] on [facility]30[facility] with L sided deficits as a code stroke. MRI demonstrated acute Right Basal ganglia infarcts and remote left PCA and R occipital cortex infarcts. PMH of  hypertension, hyperlipidemia, TIA, CVA, amyloid angiopathy, paroxysmal SVT status post ablation, complete heart block status post pacemaker, secondary parkinsonism, dementia, depression, OSA, gout, BPH, glaucoma    PT Comments  Pt resting in bed on arrival, making slow progress towards acute goals this session due to increased lethargy with pt keeping eyes closed most of session and following ~50% of simple single step verbal commands throughout. Pt needing max A to come to sit EOB with hand over hand cues for sequencing and pt demonstrating poor initiation. Pt needing up to mod A to maintain sitting balance as pt with anterior flexed posture and L lateral lean. Pt performing propped sitting to R x5 with mod A to facilitate and improved midline sitting. Transfers to stand deferred this session as no +2 assist available and for pt safety due to increased lethargy. Pt requiring total A to return to supine and positioned to comfort with pt drifting off to sleep. Pt continues to benefit from skilled PT services to progress toward functional mobility goals.     If plan is discharge home, recommend the following: A lot of help with walking and/or transfers;A lot of help with bathing/dressing/bathroom;Assistance with cooking/housework;Direct supervision/assist for medications management;Direct supervision/assist for financial management;Assist for transportation;Help with stairs or ramp for entrance   Can travel by private vehicle        Equipment Recommendations  Other (comment) (TBD with progress)    Recommendations for Other Services        Precautions / Restrictions Precautions Precautions: Fall Recall of Precautions/Restrictions: Impaired Restrictions Weight Bearing Restrictions Per Provider Order: No     Mobility  Bed Mobility Overal bed mobility: Needs Assistance Bed Mobility: Supine to Sit, Sit to Supine     Supine to sit: Max assist Sit to supine: Total assist   General bed mobility comments: able to assist minimally with bringing R LE towards EOB, MaxA for remainder of transfer. Initial left lateral lean requiring ModA to support, total A to return to supine    Transfers Overall transfer level: Needs assistance                 General transfer comment: NT this session as +2 assist not available    Ambulation/Gait               General Gait Details: unable this date   Stairs             Wheelchair Mobility     Tilt Bed    Modified Rankin (Stroke Patients Only) Modified Rankin (Stroke Patients Only) Pre-Morbid Rankin Score: Moderately severe disability Modified Rankin: Severe disability     Balance Overall balance assessment: Needs assistance Sitting-balance support: Feet supported, Single extremity supported Sitting balance-Leahy Scale: Poor Sitting balance - Comments: ModA to CGA for left lateral and anterior lean, with increased time and posterior assist able to acheive upright posture                                    Communication Communication Communication: Impaired Factors Affecting Communication: Difficulty expressing self  Cognition  Arousal: Lethargic Behavior During Therapy: Flat affect   PT - Cognitive impairments: History of cognitive impairments, Awareness, Memory, Attention, Sequencing, Problem solving, Safety/Judgement Difficult to assess due to: Impaired communication                     PT - Cognition Comments: pt follow commands intermittently, keeping eyes closed for majority of session Following commands:  Impaired Following commands impaired: Follows one step commands with increased time, Follows one step commands inconsistently    Cueing Cueing Techniques: Verbal cues, Gestural cues, Tactile cues, Visual cues  Exercises Other Exercises Other Exercises: propped sitting on R elbow with return to midline x5 to encouarge midline sitting    General Comments        Pertinent Vitals/Pain Pain Assessment Pain Assessment: Faces Breathing: normal Pain Intervention(s): Monitored during session    Home Living                          Prior Function            PT Goals (current goals can now be found in the care plan section) Acute Rehab PT Goals Patient Stated Goal: unable to state goal PT Goal Formulation: Patient unable to participate in goal setting Time For Goal Achievement: 01/14/24 Progress towards PT goals: Not progressing toward goals - comment (increased lethargy)    Frequency    Min 2X/week      PT Plan      Co-evaluation              AM-PAC PT 6 Clicks Mobility   Outcome Measure  Help needed turning from your back to your side while in a flat bed without using bedrails?: A Lot Help needed moving from lying on your back to sitting on the side of a flat bed without using bedrails?: A Lot Help needed moving to and from a bed to a chair (including a wheelchair)?: Total Help needed standing up from a chair using your arms (e.g., wheelchair or bedside chair)?: Total Help needed to walk in hospital room?: Total Help needed climbing 3-5 steps with a railing? : Total 6 Click Score: 8    End of Session   Activity Tolerance: Patient limited by fatigue Patient left: with call bell/phone within reach;in bed;with bed alarm set Nurse Communication: Mobility status PT Visit Diagnosis: Unsteadiness on feet (R26.81);Other abnormalities of gait and mobility (R26.89);Muscle weakness (generalized) (M62.81);Hemiplegia and hemiparesis Hemiplegia - Right/Left:  Left Hemiplegia - caused by: Cerebral infarction     Time: 1253-1316 PT Time Calculation (min) (ACUTE ONLY): 23 min  Charges:    $Therapeutic Activity: 23-37 mins PT General Charges $$ ACUTE PT VISIT: 1 Visit                     Dustin Kapler R. PTA Acute Rehabilitation Services Office: [phone]   Dustin Hamilton 01/02/2024, 3:48 PM

## 2024-01-02 NOTE — Plan of Care (Signed)
  Problem: Education: Goal: Knowledge of disease or condition will improve Outcome: Not Progressing Goal: Knowledge of secondary prevention will improve (MUST DOCUMENT ALL) Outcome: Not Progressing Goal: Knowledge of patient specific risk factors will improve (DELETE if not current risk factor) Outcome: Not Progressing   Problem: Ischemic Stroke/TIA Tissue Perfusion: Goal: Complications of ischemic stroke/TIA will be minimized Outcome: Progressing   Problem: Coping: Goal: Will verbalize positive feelings about self Outcome: Not Progressing Goal: Will identify appropriate support needs Outcome: Not Progressing   Problem: Health Behavior/Discharge Planning: Goal: Ability to manage health-related needs will improve Outcome: Progressing Goal: Goals will be collaboratively established with patient/family Outcome: Progressing   Problem: Self-Care: Goal: Ability to participate in self-care as condition permits will improve Outcome: Not Progressing Goal: Verbalization of feelings and concerns over difficulty with self-care will improve Outcome: Not Progressing Goal: Ability to communicate needs accurately will improve Outcome: Not Progressing   Problem: Nutrition: Goal: Risk of aspiration will decrease Outcome: Progressing Goal: Dietary intake will improve Outcome: Progressing   Problem: Education: Goal: Knowledge of General Education information will improve Description: Including pain rating scale, medication(s)/side effects and non-pharmacologic comfort measures Outcome: Progressing   Problem: Health Behavior/Discharge Planning: Goal: Ability to manage health-related needs will improve Outcome: Progressing   Problem: Clinical Measurements: Goal: Ability to maintain clinical measurements within normal limits will improve Outcome: Progressing Goal: Will remain free from infection Outcome: Progressing Goal: Diagnostic test results will improve Outcome: Progressing Goal:  Respiratory complications will improve Outcome: Progressing Goal: Cardiovascular complication will be avoided Outcome: Progressing   Problem: Activity: Goal: Risk for activity intolerance will decrease Outcome: Not Progressing   Problem: Nutrition: Goal: Adequate nutrition will be maintained Outcome: Progressing   Problem: Coping: Goal: Level of anxiety will decrease Outcome: Progressing   Problem: Elimination: Goal: Will not experience complications related to bowel motility Outcome: Progressing Goal: Will not experience complications related to urinary retention Outcome: Progressing   Problem: Pain Managment: Goal: General experience of comfort will improve and/or be controlled Outcome: Progressing   Problem: Safety: Goal: Ability to remain free from injury will improve Outcome: Progressing   Problem: Skin Integrity: Goal: Risk for impaired skin integrity will decrease Outcome: Progressing

## 2024-01-02 NOTE — Plan of Care (Signed)
  Problem: Health Behavior/Discharge Planning: Goal: Goals will be collaboratively established with patient/family Outcome: Progressing   Problem: Nutrition: Goal: Risk of aspiration will decrease Outcome: Progressing Goal: Dietary intake will improve Outcome: Progressing   Problem: Education: Goal: Knowledge of patient specific risk factors will improve (DELETE if not current risk factor) Outcome: Not Progressing   Problem: Ischemic Stroke/TIA Tissue Perfusion: Goal: Complications of ischemic stroke/TIA will be minimized Outcome: Not Progressing   Problem: Coping: Goal: Will verbalize positive feelings about self Outcome: Not Progressing Goal: Will identify appropriate support needs Outcome: Not Progressing   Problem: Health Behavior/Discharge Planning: Goal: Ability to manage health-related needs will improve Outcome: Not Progressing   Problem: Self-Care: Goal: Ability to participate in self-care as condition permits will improve Outcome: Not Progressing Goal: Verbalization of feelings and concerns over difficulty with self-care will improve Outcome: Not Progressing Goal: Ability to communicate needs accurately will improve Outcome: Not Progressing

## 2024-01-02 NOTE — Progress Notes (Signed)
 Patient's blood pressure was noted to be 86/56, accompanied by an acute decline in level of consciousness. The patient was unable to open his eyes but was able to follow commands with his right side. Due to this sudden change in mental status and increased right-sided weakness, the provider, Dr. Alfornia, was notified and promptly came to the bedside for evaluation. Orders were received for a 1000 mL normal saline bolus, lactic acid, CBG, CBC, comprehensive metabolic panel, EKG, and head CT. All tasks have been completed. The patient will continue to be closely monitored, and treatment will be adjusted as clinically indicated based on ongoing assessment and diagnostic results

## 2024-01-03 DIAGNOSIS — R29818 Other symptoms and signs involving the nervous system: Secondary | ICD-10-CM | POA: Diagnosis not present

## 2024-01-03 LAB — COMPREHENSIVE METABOLIC PANEL WITH GFR
ALT: 35 U/L (ref 0–44)
AST: 30 U/L (ref 15–41)
Albumin: 3 g/dL — ABNORMAL LOW (ref 3.5–5.0)
Alkaline Phosphatase: 55 U/L (ref 38–126)
Anion gap: 9 (ref 5–15)
BUN: 27 mg/dL — ABNORMAL HIGH (ref 8–23)
CO2: 23 mmol/L (ref 22–32)
Calcium: 9.6 mg/dL (ref 8.9–10.3)
Chloride: 111 mmol/L (ref 98–111)
Creatinine, Ser: 0.99 mg/dL (ref 0.61–1.24)
GFR, Estimated: >60 mL/min (ref >60)
Glucose, Bld: 144 mg/dL — ABNORMAL HIGH (ref 70–99)
Potassium: 3.7 mmol/L (ref 3.5–5.1)
Sodium: 143 mmol/L (ref 135–145)
Total Bilirubin: 0.8 mg/dL (ref 0.0–1.2)
Total Protein: 6.2 g/dL — ABNORMAL LOW (ref 6.5–8.1)

## 2024-01-03 LAB — GLUCOSE, CAPILLARY
Glucose-Capillary: 140 mg/dL — ABNORMAL HIGH (ref 70–99)
Glucose-Capillary: 144 mg/dL — ABNORMAL HIGH (ref 70–99)
Glucose-Capillary: 146 mg/dL — ABNORMAL HIGH (ref 70–99)
Glucose-Capillary: 153 mg/dL — ABNORMAL HIGH (ref 70–99)
Glucose-Capillary: 154 mg/dL — ABNORMAL HIGH (ref 70–99)
Glucose-Capillary: 156 mg/dL — ABNORMAL HIGH (ref 70–99)
Glucose-Capillary: 160 mg/dL — ABNORMAL HIGH (ref 70–99)

## 2024-01-03 LAB — CBC
HCT: 44.6 % (ref 39.0–52.0)
Hemoglobin: 15 g/dL (ref 13.0–17.0)
MCH: 31.6 pg (ref 26.0–34.0)
MCHC: 33.6 g/dL (ref 30.0–36.0)
MCV: 94.1 fL (ref 80.0–100.0)
Platelets: 124 K/uL — ABNORMAL LOW (ref 150–400)
RBC: 4.74 MIL/uL (ref 4.22–5.81)
RDW: 13.6 % (ref 11.5–15.5)
WBC: 6.7 K/uL (ref 4.0–10.5)
nRBC: 0 % (ref 0.0–0.2)

## 2024-01-03 LAB — MAGNESIUM: Magnesium: 2 mg/dL (ref 1.7–2.4)

## 2024-01-03 NOTE — Plan of Care (Signed)
  Problem: Health Behavior/Discharge Planning: Goal: Goals will be collaboratively established with patient/family Outcome: Progressing   Problem: Nutrition: Goal: Dietary intake will improve Outcome: Progressing   Problem: Ischemic Stroke/TIA Tissue Perfusion: Goal: Complications of ischemic stroke/TIA will be minimized Outcome: Not Progressing   Problem: Coping: Goal: Will verbalize positive feelings about self Outcome: Not Progressing Goal: Will identify appropriate support needs Outcome: Not Progressing   Problem: Health Behavior/Discharge Planning: Goal: Ability to manage health-related needs will improve Outcome: Not Progressing   Problem: Self-Care: Goal: Ability to participate in self-care as condition permits will improve Outcome: Not Progressing Goal: Verbalization of feelings and concerns over difficulty with self-care will improve Outcome: Not Progressing Goal: Ability to communicate needs accurately will improve Outcome: Not Progressing   Problem: Nutrition: Goal: Risk of aspiration will decrease Outcome: Not Progressing

## 2024-01-03 NOTE — Progress Notes (Signed)
 PROGRESS NOTE  Dustin Hamilton FMW:[id number] DOB: 1940/06/24 DOA: 12/30/2023 PCP: [facility]   LOS: 3 days   Brief Narrative / Interim history: 83 year old male with HTN, HLD, TIA, prior CVA, amyloid angiopathy, paroxysmal SVT status post ablation, CAD 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  When he woke up on the day of admission had left-sided facial droop, weakness, right gaze deviation and was unable to speak.  At baseline he is confused but walking, able to feed self and have short conversation per his wife  Subjective / 24h Interval events: No events overnight, he is doing well this morning.  Remains poorly communicative but is alert and follows commands  Assesement and Plan: Principal Problem:   Focal neurological deficit Active Problems:   Essential hypertension   History of TIA (transient ischemic attack) and stroke   Benign prostatic hyperplasia with urinary obstruction   Paroxysmal SVT (supraventricular tachycardia) (HCC)   Complete heart block (HCC)   Dementia (HCC)   Glaucoma   Mixed hyperlipidemia   Obstructive sleep apnea   Secondary parkinsonism, unspecified (HCC)   Status cardiac pacemaker   Protein-calorie malnutrition, severe   Principal problem Acute stroke-MRI on admission with acute infarct in the right basal ganglia and remote left PCA territory infarcts, additional small remote infarct in the right occipital cortex within the right PCA territory.  Neurology consulted and followed patient while hospitalized.  CT angiogram with moderate stenosis of the distal M1 segment of the right MCA.  2D echo showed LVEF 55 to 60%, normal RV.  LDL is 67, A1c is 5.5.  Failed SLP, now has a core track.  If he does not improve we will need to convert to a PEG tube over the next week or 2 - stable to discharge to inpatient rehab, awaiting insurance authorization  Active  problems HTN-he is normotensive today  History of TIA/prior CVAs-noted  Paroxysmal SVT-status post ablation  CHB-status post pacemaker, no events on telemetry  Dementia, parkinsonism-continue home medications  BPH-continue dutasteride   Scheduled Meds:  aspirin   81 mg Oral Daily   clopidogrel   75 mg Oral Daily   dutasteride   0.5 mg Oral Daily   feeding supplement (PROSource TF20)  60 mL Per Tube Daily   free water   150 mL Per Tube Q6H   magnesium  oxide  800 mg Oral Once   melatonin  3 mg Oral QHS   memantine   10 mg Oral BID   multivitamin with minerals  1 tablet Oral Daily   potassium chloride   40 mEq Oral Once   sodium chloride  flush  3 mL Intravenous Q12H   thiamine   100 mg Per Tube Daily   Continuous Infusions:  feeding supplement (OSMOLITE 1.5 CAL) 40 mL/hr at 01/02/24 1550    PRN Meds:.acetaminophen  **OR** acetaminophen , polyethylene glycol  Current Outpatient Medications  Medication Instructions   acetaminophen  (TYLENOL ) 650 mg, Oral, 3 times daily PRN   allopurinol  (ZYLOPRIM ) 100 mg, Oral, Daily   amLODipine  (NORVASC ) 5 mg, Daily   aspirin  81 mg, Oral, Daily   brimonidine  (ALPHAGAN ) 0.2 % ophthalmic solution 1 drop, Both Eyes, 2 times daily   colchicine  0.6 mg, Oral, Daily   dorzolamide  (TRUSOPT ) 2 % ophthalmic solution 1 drop, 2 times daily   dutasteride  (AVODART ) 0.5 mg, Every morning   galantamine  (RAZADYNE ) 8 mg, Oral, Daily, Takes along with memantine    latanoprost  (XALATAN ) 0.005 % ophthalmic solution 1 drop, Daily at bedtime  melatonin 3 mg, Daily at bedtime   memantine  (NAMENDA ) 10 mg, 2 times daily   tamsulosin  (FLOMAX ) 0.4 mg, Oral, Daily    Diet Orders (From admission, onward)     Start     Ordered   12/30/23 1044  Diet NPO time specified  Diet effective now        12/30/23 1044            DVT prophylaxis: SCDs Start: 12/30/23 1603   Lab Results  Component Value Date   PLT 124 (L) 01/03/2024      Code Status: Full Code  Family  Communication: No family at bedside  Status is: Inpatient   Level of care: Telemetry Medical  Consultants:  Neurology  Objective: Vitals:   01/02/24 2010 01/03/24 0002 01/03/24 0340 01/03/24 0748  BP: (!) 141/102 110/77 121/61 137/70  Pulse: 67 64 66 86  Resp: 16 12 14 16   Temp: 98.7 F (37.1 C) 98.2 F (36.8 C) 98.1 F (36.7 C) 98.6 F (37 C)  TempSrc: Oral Oral Oral Oral  SpO2: 92% 95% 97% 95%  Weight:      Height:        Intake/Output Summary (Last 24 hours) at 01/03/2024 0835 Last data filed at 01/03/2024 0504 Gross per 24 hour  Intake 1821.65 ml  Output 800 ml  Net 1021.65 ml   Wt Readings from Last 3 Encounters:  12/31/23 67.6 kg  11/10/23 68.8 kg  10/28/23 68.8 kg    Examination:  Constitutional: NAD Eyes: lids and conjunctivae normal, no scleral icterus ENMT: mmm Neck: normal, supple Respiratory: clear to auscultation bilaterally, no wheezing, no crackles. Normal respiratory effort.  Cardiovascular: Regular rate and rhythm, no murmurs / rubs / gallops.  Abdomen: soft, no distention, no tenderness. Bowel sounds positive.    Data Reviewed: I have independently reviewed following labs and imaging studies   CBC Recent Labs  Lab 12/30/23 1044 12/30/23 1050 12/31/23 0822 01/02/24 0258 01/02/24 0510 01/03/24 0329  WBC 4.7  --  8.1 9.3 7.3 6.7  HGB 11.8* 12.2* 17.3* 16.0 18.5* 15.0  HCT 35.6* 36.0* 50.8 47.6 54.5* 44.6  PLT 92*  --  146* 146* 113* 124*  MCV 96.5  --  92.9 94.3 95.1 94.1  MCH 32.0  --  31.6 31.7 32.3 31.6  MCHC 33.1  --  34.1 33.6 33.9 33.6  RDW 13.9  --  13.5 13.5 13.6 13.6  LYMPHSABS 0.8  --   --   --   --   --   MONOABS 0.3  --   --   --   --   --   EOSABS 0.1  --   --   --   --   --   BASOSABS 0.0  --   --   --   --   --     Recent Labs  Lab 12/30/23 1044 12/30/23 1050 12/31/23 0351 12/31/23 0822 01/02/24 0258 01/02/24 0510 01/02/24 1429 01/03/24 0329  NA 144 150*  --  142 144 144  --  143  K 3.3* 2.7*  --  3.6 3.6  3.7  --  3.7  CL 117* 117*  --  108 111 109  --  111  CO2 23  --   --  23 24 25   --  23  GLUCOSE 86 71  --  100* 156* 137*  --  144*  BUN 16 13  --  14 23 24*  --  27*  CREATININE  0.78 0.60*  --  1.03 1.24 1.36*  --  0.99  CALCIUM  8.0*  --   --  10.1 9.9 10.1  --  9.6  AST 17  --   --  18 33 34  --  30  ALT 14  --   --  17 27 31   --  35  ALKPHOS 50  --   --  76 61 63  --  55  BILITOT 0.9  --   --  1.4* 1.5* 1.4*  --  0.8  ALBUMIN 2.9*  --   --  3.8 3.4* 3.5  --  3.0*  MG 1.6*  --   --   --  2.1  --   --  2.0  LATICACIDVEN  --   --   --   --   --  2.5* 2.5*  --   INR 1.2  --   --   --   --   --   --   --   HGBA1C  --   --  5.2  --   --   --   --   --     ------------------------------------------------------------------------------------------------------------------ No results for input(s): CHOL, HDL, LDLCALC, TRIG, CHOLHDL, LDLDIRECT in the last 72 hours.   Lab Results  Component Value Date   HGBA1C 5.2 12/31/2023   ------------------------------------------------------------------------------------------------------------------ No results for input(s): TSH, T4TOTAL, T3FREE, THYROIDAB in the last 72 hours.  Invalid input(s): FREET3  Cardiac Enzymes No results for input(s): CKMB, TROPONINI, MYOGLOBIN in the last 168 hours.  Invalid input(s): CK ------------------------------------------------------------------------------------------------------------------    Component Value Date/Time   BNP 106.2 (H) 03/20/2023 0312    CBG: Recent Labs  Lab 01/02/24 1554 01/02/24 2014 01/03/24 0008 01/03/24 0344 01/03/24 0749  GLUCAP 99 153* 153* 144* 160*    Recent Results (from the past 240 hours)  SARS Coronavirus 2 by RT PCR (hospital order, performed in [facility] hospital lab) *cepheid single result test*     Status: None   Collection Time: 12/30/23 11:00 PM  Result Value Ref Range Status   SARS Coronavirus 2 by RT PCR NEGATIVE NEGATIVE  Final    Comment: Performed at [facility], [address], [place], [place] [address]     Radiology Studies: No results found.     Nilda Fendt, MD, PhD [facility]  Between 7 am - 7 pm I am available, please contact me via Amion (for emergencies) or Securechat (non urgent messages)  Between 7 pm - 7 am I am not available, please contact night coverage MD/APP via Amion

## 2024-01-03 NOTE — Plan of Care (Signed)
  Problem: Education: Goal: Knowledge of disease or condition will improve Outcome: Not Progressing Goal: Knowledge of secondary prevention will improve (MUST DOCUMENT ALL) Outcome: Not Progressing Goal: Knowledge of patient specific risk factors will improve (DELETE if not current risk factor) Outcome: Not Progressing   Problem: Ischemic Stroke/TIA Tissue Perfusion: Goal: Complications of ischemic stroke/TIA will be minimized Outcome: Not Progressing   Problem: Coping: Goal: Will verbalize positive feelings about self Outcome: Not Progressing Goal: Will identify appropriate support needs Outcome: Not Progressing   Problem: Health Behavior/Discharge Planning: Goal: Ability to manage health-related needs will improve Outcome: Not Progressing Goal: Goals will be collaboratively established with patient/family Outcome: Not Progressing   Problem: Nutrition: Goal: Risk of aspiration will decrease Outcome: Not Progressing Goal: Dietary intake will improve Outcome: Not Progressing   Problem: Education: Goal: Knowledge of General Education information will improve Description: Including pain rating scale, medication(s)/side effects and non-pharmacologic comfort measures Outcome: Not Progressing   Problem: Clinical Measurements: Goal: Ability to maintain clinical measurements within normal limits will improve Outcome: Progressing Goal: Will remain free from infection Outcome: Progressing Goal: Diagnostic test results will improve Outcome: Not Progressing Goal: Respiratory complications will improve Outcome: Progressing Goal: Cardiovascular complication will be avoided Outcome: Progressing   Problem: Activity: Goal: Risk for activity intolerance will decrease Outcome: Not Progressing   Problem: Nutrition: Goal: Adequate nutrition will be maintained Outcome: Not Progressing   Problem: Coping: Goal: Level of anxiety will decrease Outcome: Not Progressing   Problem:  Elimination: Goal: Will not experience complications related to bowel motility Outcome: Not Progressing Goal: Will not experience complications related to urinary retention Outcome: Not Progressing

## 2024-01-04 ENCOUNTER — Encounter (HOSPITAL_COMMUNITY): Payer: Self-pay | Admitting: Physical Medicine & Rehabilitation

## 2024-01-04 ENCOUNTER — Encounter (HOSPITAL_COMMUNITY): Payer: Self-pay | Admitting: Internal Medicine

## 2024-01-04 ENCOUNTER — Inpatient Hospital Stay (HOSPITAL_COMMUNITY)
Admission: AD | Admit: 2024-01-04 | Discharge: 2024-01-29 | DRG: 056 | Disposition: A | Discharge status: Home Health | Source: Intra-hospital | Attending: Physical Medicine & Rehabilitation | Admitting: Physical Medicine & Rehabilitation

## 2024-01-04 ENCOUNTER — Other Ambulatory Visit: Payer: Self-pay

## 2024-01-04 DIAGNOSIS — N401 Enlarged prostate with lower urinary tract symptoms: Secondary | ICD-10-CM | POA: Diagnosis present

## 2024-01-04 DIAGNOSIS — Z8616 Personal history of COVID-19: Secondary | ICD-10-CM | POA: Diagnosis not present

## 2024-01-04 DIAGNOSIS — Z681 Body mass index (BMI) 19 or less, adult: Secondary | ICD-10-CM

## 2024-01-04 DIAGNOSIS — Z841 Family history of disorders of kidney and ureter: Secondary | ICD-10-CM

## 2024-01-04 DIAGNOSIS — D693 Immune thrombocytopenic purpura: Secondary | ICD-10-CM | POA: Diagnosis not present

## 2024-01-04 DIAGNOSIS — D696 Thrombocytopenia, unspecified: Secondary | ICD-10-CM | POA: Diagnosis present

## 2024-01-04 DIAGNOSIS — I509 Heart failure, unspecified: Secondary | ICD-10-CM | POA: Diagnosis present

## 2024-01-04 DIAGNOSIS — R4189 Other symptoms and signs involving cognitive functions and awareness: Secondary | ICD-10-CM | POA: Diagnosis present

## 2024-01-04 DIAGNOSIS — I639 Cerebral infarction, unspecified: Secondary | ICD-10-CM | POA: Diagnosis not present

## 2024-01-04 DIAGNOSIS — E872 Acidosis, unspecified: Secondary | ICD-10-CM | POA: Diagnosis present

## 2024-01-04 DIAGNOSIS — R1310 Dysphagia, unspecified: Secondary | ICD-10-CM | POA: Diagnosis present

## 2024-01-04 DIAGNOSIS — R414 Neurologic neglect syndrome: Secondary | ICD-10-CM | POA: Diagnosis present

## 2024-01-04 DIAGNOSIS — R319 Hematuria, unspecified: Secondary | ICD-10-CM | POA: Diagnosis not present

## 2024-01-04 DIAGNOSIS — I110 Hypertensive heart disease with heart failure: Secondary | ICD-10-CM | POA: Diagnosis present

## 2024-01-04 DIAGNOSIS — G4700 Insomnia, unspecified: Secondary | ICD-10-CM | POA: Diagnosis present

## 2024-01-04 DIAGNOSIS — I10 Essential (primary) hypertension: Secondary | ICD-10-CM | POA: Diagnosis present

## 2024-01-04 DIAGNOSIS — Z880 Allergy status to penicillin: Secondary | ICD-10-CM

## 2024-01-04 DIAGNOSIS — Z7982 Long term (current) use of aspirin: Secondary | ICD-10-CM

## 2024-01-04 DIAGNOSIS — R32 Unspecified urinary incontinence: Secondary | ICD-10-CM | POA: Diagnosis present

## 2024-01-04 DIAGNOSIS — Z79899 Other long term (current) drug therapy: Secondary | ICD-10-CM

## 2024-01-04 DIAGNOSIS — R338 Other retention of urine: Secondary | ICD-10-CM | POA: Diagnosis present

## 2024-01-04 DIAGNOSIS — G8194 Hemiplegia, unspecified affecting left nondominant side: Secondary | ICD-10-CM

## 2024-01-04 DIAGNOSIS — H409 Unspecified glaucoma: Secondary | ICD-10-CM | POA: Diagnosis present

## 2024-01-04 DIAGNOSIS — F482 Pseudobulbar affect: Secondary | ICD-10-CM | POA: Diagnosis present

## 2024-01-04 DIAGNOSIS — G219 Secondary parkinsonism, unspecified: Secondary | ICD-10-CM | POA: Diagnosis present

## 2024-01-04 DIAGNOSIS — M109 Gout, unspecified: Secondary | ICD-10-CM | POA: Diagnosis present

## 2024-01-04 DIAGNOSIS — K760 Fatty (change of) liver, not elsewhere classified: Secondary | ICD-10-CM | POA: Diagnosis present

## 2024-01-04 DIAGNOSIS — F0280 Dementia in other diseases classified elsewhere without behavioral disturbance: Secondary | ICD-10-CM | POA: Diagnosis present

## 2024-01-04 DIAGNOSIS — R1312 Dysphagia, oropharyngeal phase: Secondary | ICD-10-CM | POA: Diagnosis not present

## 2024-01-04 DIAGNOSIS — Z8261 Family history of arthritis: Secondary | ICD-10-CM

## 2024-01-04 DIAGNOSIS — F0390 Unspecified dementia without behavioral disturbance: Secondary | ICD-10-CM | POA: Diagnosis present

## 2024-01-04 DIAGNOSIS — Z8673 Personal history of transient ischemic attack (TIA), and cerebral infarction without residual deficits: Secondary | ICD-10-CM

## 2024-01-04 DIAGNOSIS — F01518 Vascular dementia, unspecified severity, with other behavioral disturbance: Secondary | ICD-10-CM | POA: Diagnosis present

## 2024-01-04 DIAGNOSIS — R066 Hiccough: Secondary | ICD-10-CM | POA: Diagnosis present

## 2024-01-04 DIAGNOSIS — R279 Unspecified lack of coordination: Secondary | ICD-10-CM | POA: Diagnosis not present

## 2024-01-04 DIAGNOSIS — Z833 Family history of diabetes mellitus: Secondary | ICD-10-CM

## 2024-01-04 DIAGNOSIS — Z7401 Bed confinement status: Secondary | ICD-10-CM | POA: Diagnosis not present

## 2024-01-04 DIAGNOSIS — I459 Conduction disorder, unspecified: Secondary | ICD-10-CM | POA: Diagnosis present

## 2024-01-04 DIAGNOSIS — K9421 Gastrostomy hemorrhage: Secondary | ICD-10-CM | POA: Diagnosis not present

## 2024-01-04 DIAGNOSIS — F01B0 Vascular dementia, moderate, without behavioral disturbance, psychotic disturbance, mood disturbance, and anxiety: Secondary | ICD-10-CM | POA: Diagnosis not present

## 2024-01-04 DIAGNOSIS — E860 Dehydration: Secondary | ICD-10-CM | POA: Diagnosis present

## 2024-01-04 DIAGNOSIS — R7989 Other specified abnormal findings of blood chemistry: Secondary | ICD-10-CM | POA: Insufficient documentation

## 2024-01-04 DIAGNOSIS — Z860101 Personal history of adenomatous and serrated colon polyps: Secondary | ICD-10-CM

## 2024-01-04 DIAGNOSIS — N179 Acute kidney failure, unspecified: Secondary | ICD-10-CM | POA: Diagnosis present

## 2024-01-04 DIAGNOSIS — E43 Unspecified severe protein-calorie malnutrition: Secondary | ICD-10-CM | POA: Diagnosis present

## 2024-01-04 DIAGNOSIS — I6381 Other cerebral infarction due to occlusion or stenosis of small artery: Secondary | ICD-10-CM | POA: Diagnosis not present

## 2024-01-04 DIAGNOSIS — N390 Urinary tract infection, site not specified: Secondary | ICD-10-CM | POA: Diagnosis present

## 2024-01-04 DIAGNOSIS — I69354 Hemiplegia and hemiparesis following cerebral infarction affecting left non-dominant side: Principal | ICD-10-CM

## 2024-01-04 DIAGNOSIS — Z823 Family history of stroke: Secondary | ICD-10-CM

## 2024-01-04 DIAGNOSIS — E782 Mixed hyperlipidemia: Secondary | ICD-10-CM | POA: Diagnosis present

## 2024-01-04 DIAGNOSIS — R339 Retention of urine, unspecified: Secondary | ICD-10-CM | POA: Diagnosis not present

## 2024-01-04 DIAGNOSIS — Z888 Allergy status to other drugs, medicaments and biological substances status: Secondary | ICD-10-CM

## 2024-01-04 DIAGNOSIS — Z7902 Long term (current) use of antithrombotics/antiplatelets: Secondary | ICD-10-CM

## 2024-01-04 DIAGNOSIS — F0150 Vascular dementia without behavioral disturbance: Secondary | ICD-10-CM | POA: Diagnosis present

## 2024-01-04 DIAGNOSIS — Z950 Presence of cardiac pacemaker: Secondary | ICD-10-CM

## 2024-01-04 DIAGNOSIS — Z8249 Family history of ischemic heart disease and other diseases of the circulatory system: Secondary | ICD-10-CM

## 2024-01-04 DIAGNOSIS — R29818 Other symptoms and signs involving the nervous system: Secondary | ICD-10-CM | POA: Diagnosis not present

## 2024-01-04 DIAGNOSIS — Z87891 Personal history of nicotine dependence: Secondary | ICD-10-CM

## 2024-01-04 LAB — GLUCOSE, CAPILLARY
Glucose-Capillary: 143 mg/dL — ABNORMAL HIGH (ref 70–99)
Glucose-Capillary: 176 mg/dL — ABNORMAL HIGH (ref 70–99)
Glucose-Capillary: 168 mg/dL — ABNORMAL HIGH (ref 70–99)
Glucose-Capillary: 140 mg/dL — ABNORMAL HIGH (ref 70–99)
Glucose-Capillary: 150 mg/dL — ABNORMAL HIGH (ref 70–99)

## 2024-01-04 MED ORDER — BISACODYL 10 MG RE SUPP: 10.0000 mg | Freq: Every day | RECTAL | Status: DC | PRN

## 2024-01-04 MED ORDER — ASPIRIN 81 MG PO CHEW
81.0000 mg | CHEWABLE_TABLET | Freq: Every day | ORAL | Status: DC
  Administered 2024-01-04: 81 mg

## 2024-01-04 MED ORDER — ASPIRIN 81 MG PO CHEW: 81.0000 mg | CHEWABLE_TABLET | Freq: Every day | ORAL | Status: DC

## 2024-01-04 MED ORDER — LIDOCAINE HCL URETHRAL/MUCOSAL 2 % EX GEL: CUTANEOUS | Status: DC | PRN

## 2024-01-04 MED ORDER — THIAMINE MONONITRATE 100 MG PO TABS
100.0000 mg | ORAL_TABLET | Freq: Every day | ORAL | Status: AC
  Administered 2024-01-05 – 2024-01-07 (×3): 100 mg
  Filled 2024-01-04 (×3): qty 1

## 2024-01-04 MED ORDER — DIPHENHYDRAMINE HCL 25 MG PO CAPS: 25.0000 mg | ORAL_CAPSULE | Freq: Four times a day (QID) | ORAL | Status: DC | PRN

## 2024-01-04 MED ORDER — BRIMONIDINE TARTRATE 0.2 % OP SOLN
1.0000 [drp] | Freq: Two times a day (BID) | OPHTHALMIC | Status: DC
  Administered 2024-01-04 – 2024-01-29 (×56): 1 [drp] via OPHTHALMIC
  Filled 2024-01-04: qty 5

## 2024-01-04 MED ORDER — CLOPIDOGREL BISULFATE 75 MG PO TABS
75.0000 mg | ORAL_TABLET | Freq: Every day | ORAL | Status: DC
  Administered 2024-01-05 – 2024-01-13 (×12): 75 mg
  Filled 2024-01-04 (×9): qty 1

## 2024-01-04 MED ORDER — ACETAMINOPHEN 325 MG PO TABS: 650.0000 mg | ORAL_TABLET | Freq: Four times a day (QID) | ORAL | Status: DC | PRN

## 2024-01-04 MED ORDER — FLEET ENEMA RE ENEM: 1.0000 | ENEMA | Freq: Once | RECTAL | Status: DC | PRN

## 2024-01-04 MED ORDER — POLYETHYLENE GLYCOL 3350 17 G PO PACK: 17.0000 g | PACK | Freq: Every day | ORAL | Status: DC | PRN

## 2024-01-04 MED ORDER — CLOPIDOGREL BISULFATE 75 MG PO TABS
75.0000 mg | ORAL_TABLET | Freq: Every day | ORAL | Status: DC
  Administered 2024-01-04: 75 mg

## 2024-01-04 MED ORDER — CLOPIDOGREL BISULFATE 75 MG PO TABS: 75.0000 mg | ORAL_TABLET | Freq: Every day | ORAL | Status: DC

## 2024-01-04 MED ORDER — BETHANECHOL CHLORIDE 10 MG PO TABS: 10.0000 mg | ORAL_TABLET | Freq: Three times a day (TID) | ORAL | Status: DC

## 2024-01-04 MED ORDER — DORZOLAMIDE HCL 2 % OP SOLN
1.0000 [drp] | Freq: Two times a day (BID) | OPHTHALMIC | Status: DC
  Administered 2024-01-04 – 2024-01-29 (×56): 1 [drp] via OPHTHALMIC
  Filled 2024-01-04: qty 10

## 2024-01-04 MED ORDER — ENOXAPARIN SODIUM 40 MG/0.4ML IJ SOSY
40.0000 mg | PREFILLED_SYRINGE | INTRAMUSCULAR | Status: DC
  Administered 2024-01-04 – 2024-01-17 (×15): 40 mg via SUBCUTANEOUS
  Filled 2024-01-04 (×12): qty 0.4

## 2024-01-04 MED ORDER — COLCHICINE 0.6 MG PO TABS: 0.6000 mg | ORAL_TABLET | Freq: Every day | ORAL | Status: DC | PRN

## 2024-01-04 MED ORDER — OSMOLITE 1.5 CAL PO LIQD
1000.0000 mL | ORAL | Status: DC
  Administered 2024-01-05 – 2024-01-14 (×10): 1000 mL
  Filled 2024-01-04 (×8): qty 1000

## 2024-01-04 MED ORDER — ADULT MULTIVITAMIN W/MINERALS CH
1.0000 | ORAL_TABLET | Freq: Every day | ORAL | Status: DC
  Administered 2024-01-04: 1

## 2024-01-04 MED ORDER — PROSOURCE TF20 ENFIT COMPATIBL EN LIQD
60.0000 mL | Freq: Every day | ENTERAL | Status: DC
  Administered 2024-01-05 – 2024-01-20 (×19): 60 mL
  Filled 2024-01-04 (×15): qty 60

## 2024-01-04 MED ORDER — ALUM & MAG HYDROXIDE-SIMETH 200-200-20 MG/5ML PO SUSP
30.0000 mL | ORAL | Status: DC | PRN
Start: 2024-01-04 — End: 2024-01-05

## 2024-01-04 MED ORDER — PROCHLORPERAZINE 25 MG RE SUPP: 25.0000 mg | Freq: Two times a day (BID) | RECTAL | Status: DC | PRN

## 2024-01-04 MED ORDER — BETHANECHOL CHLORIDE 10 MG PO TABS
10.0000 mg | ORAL_TABLET | Freq: Three times a day (TID) | ORAL | Status: DC
  Administered 2024-01-04: 10 mg
  Filled 2024-01-04: qty 1

## 2024-01-04 MED ORDER — MEMANTINE HCL 10 MG PO TABS
10.0000 mg | ORAL_TABLET | Freq: Two times a day (BID) | ORAL | Status: DC
  Administered 2024-01-04: 10 mg

## 2024-01-04 MED ORDER — AMLODIPINE BESYLATE 5 MG PO TABS: 5.0000 mg | ORAL_TABLET | Freq: Every day | ORAL | Status: DC

## 2024-01-04 MED ORDER — ALLOPURINOL 100 MG PO TABS: 100.0000 mg | ORAL_TABLET | Freq: Every day | ORAL | Status: AC

## 2024-01-04 MED ORDER — BETHANECHOL CHLORIDE 10 MG PO TABS
10.0000 mg | ORAL_TABLET | Freq: Three times a day (TID) | ORAL | Status: DC
  Administered 2024-01-04 – 2024-01-13 (×34): 10 mg
  Filled 2024-01-04 (×27): qty 1

## 2024-01-04 MED ORDER — OSMOLITE 1.5 CAL PO LIQD: 1000.0000 mL | ORAL | Status: DC

## 2024-01-04 MED ORDER — LATANOPROST 0.005 % OP SOLN
1.0000 [drp] | Freq: Every day | OPHTHALMIC | Status: DC
  Administered 2024-01-04 – 2024-01-28 (×28): 1 [drp] via OPHTHALMIC
  Filled 2024-01-04: qty 2.5

## 2024-01-04 MED ORDER — ASPIRIN 81 MG PO CHEW
81.0000 mg | CHEWABLE_TABLET | Freq: Every day | ORAL | Status: DC
  Administered 2024-01-05 – 2024-01-13 (×12): 81 mg
  Filled 2024-01-04 (×10): qty 1

## 2024-01-04 MED ORDER — MELATONIN 3 MG PO TABS
3.0000 mg | ORAL_TABLET | Freq: Every day | ORAL | Status: DC
  Administered 2024-01-04 – 2024-01-28 (×27): 3 mg
  Filled 2024-01-04 (×23): qty 1

## 2024-01-04 MED ORDER — ALLOPURINOL 100 MG PO TABS
100.0000 mg | ORAL_TABLET | Freq: Every day | ORAL | Status: DC
  Administered 2024-01-04 – 2024-01-05 (×2): 100 mg via ORAL
  Filled 2024-01-04 (×2): qty 1

## 2024-01-04 MED ORDER — FINASTERIDE 5 MG PO TABS: 5.0000 mg | ORAL_TABLET | Freq: Every day | ORAL | Status: DC

## 2024-01-04 MED ORDER — ACETAMINOPHEN 650 MG RE SUPP: 650.0000 mg | Freq: Four times a day (QID) | RECTAL | Status: DC | PRN

## 2024-01-04 MED ORDER — MELATONIN 3 MG PO TABS: 3.0000 mg | ORAL_TABLET | Freq: Every day | ORAL | Status: DC

## 2024-01-04 MED ORDER — PROCHLORPERAZINE MALEATE 5 MG PO TABS: 5.0000 mg | ORAL_TABLET | Freq: Four times a day (QID) | ORAL | Status: DC | PRN

## 2024-01-04 MED ORDER — ACETAMINOPHEN 325 MG PO TABS
325.0000 mg | ORAL_TABLET | ORAL | Status: DC | PRN
  Administered 2024-01-11 – 2024-01-24 (×6): 650 mg
  Filled 2024-01-04 (×5): qty 2

## 2024-01-04 MED ORDER — ADULT MULTIVITAMIN W/MINERALS CH
1.0000 | ORAL_TABLET | Freq: Every day | ORAL | Status: DC
  Administered 2024-01-05 – 2024-01-29 (×28): 1
  Filled 2024-01-04 (×25): qty 1

## 2024-01-04 MED ORDER — PROCHLORPERAZINE EDISYLATE 10 MG/2ML IJ SOLN: 5.0000 mg | Freq: Two times a day (BID) | INTRAMUSCULAR | Status: DC | PRN

## 2024-01-04 MED ORDER — GUAIFENESIN-DM 100-10 MG/5ML PO SYRP: 5.0000 mL | ORAL_SOLUTION | Freq: Four times a day (QID) | ORAL | Status: DC | PRN

## 2024-01-04 MED ORDER — FREE WATER
150.0000 mL | Freq: Four times a day (QID) | Status: DC
  Administered 2024-01-04 – 2024-01-11 (×29): 150 mL

## 2024-01-04 MED ORDER — MEMANTINE HCL 10 MG PO TABS
10.0000 mg | ORAL_TABLET | Freq: Two times a day (BID) | ORAL | Status: DC
  Administered 2024-01-04 – 2024-01-29 (×55): 10 mg
  Filled 2024-01-04 (×50): qty 1

## 2024-01-04 NOTE — Progress Notes (Signed)
 Physical Therapy Treatment Patient Details Name: Dustin Hamilton MRN: [id number] DOB: 1940/10/06 Today's Date: 01/04/2024   History of Present Illness Pt is an 83 yo male presenting to [facility] on 12/30/19 with L sided deficits as a code stroke. MRI demonstrated acute Right Basal ganglia infarcts and remote left PCA and R occipital cortex infarcts. PMH of  hypertension, hyperlipidemia, TIA, CVA, amyloid angiopathy, paroxysmal SVT status post ablation, complete heart block status post pacemaker, secondary parkinsonism, dementia, depression, OSA, gout, BPH, glaucoma    PT Comments  Pt received in supine and responding intermittently, but does not open eyes despite cues. Pt limited by cognition with impaired command following and initiation. Pt requires max A +2 to sit to EOB and mod-max A to maintain sitting balance. Attempted BLE exercises, but pt unable to perform despite multimodal cues. Pt able to follow minimal BUE exercises and wipes mouth with a washcloth. Pt requires assist at R elbow and intermittent hand over hand to wipe rest of face with a washcloth. Pt continues to benefit from PT services to progress toward functional mobility goals.     If plan is discharge home, recommend the following: A lot of help with walking and/or transfers;A lot of help with bathing/dressing/bathroom;Assistance with cooking/housework;Direct supervision/assist for medications management;Direct supervision/assist for financial management;Assist for transportation;Help with stairs or ramp for entrance   Can travel by private vehicle        Equipment Recommendations  Other (comment) (TBD with progress)    Recommendations for Other Services       Precautions / Restrictions Precautions Precautions: Fall Recall of Precautions/Restrictions: Impaired Restrictions Weight Bearing Restrictions Per Provider Order: No     Mobility  Bed Mobility Overal bed mobility: Needs Assistance Bed Mobility: Supine to Sit, Sit  to Supine     Supine to sit: Max assist, +2 for physical assistance Sit to supine: Total assist, +2 for physical assistance   General bed mobility comments: Pt requires dense cues for sequencing and technique and max-total A +2 for all aspects    Transfers Overall transfer level: Needs assistance                 General transfer comment: deferred    Ambulation/Gait                   Stairs             Wheelchair Mobility     Tilt Bed    Modified Rankin (Stroke Patients Only) Modified Rankin (Stroke Patients Only) Pre-Morbid Rankin Score: Moderately severe disability Modified Rankin: Severe disability     Balance Overall balance assessment: Needs assistance Sitting-balance support: Feet supported, Single extremity supported Sitting balance-Leahy Scale: Poor Sitting balance - Comments: Pt demonstrates L lateral and posterior leans requiring mod-max A to correct. Pt demonstrates some postural reactions, however is unable to hold upright posture due to weakness       Standing balance comment: nt                            Communication Communication Communication: Impaired Factors Affecting Communication: Difficulty expressing self  Cognition Arousal: Lethargic Behavior During Therapy: Flat affect   PT - Cognitive impairments: History of cognitive impairments, Awareness, Memory, Attention, Sequencing, Problem solving, Safety/Judgement Difficult to assess due to: Impaired communication                     PT - Cognition Comments: Pt  keeps eyes closed throughout session and follows commands inconsistently with dense cues Following commands: Impaired Following commands impaired: Follows one step commands with increased time, Follows one step commands inconsistently    Cueing Cueing Techniques: Verbal cues, Gestural cues, Tactile cues, Visual cues  Exercises General Exercises - Lower Extremity Long Arc Quad: PROM, Both,  Other reps (comment), Seated, 5 reps    General Comments        Pertinent Vitals/Pain Pain Assessment Pain Assessment: No/denies pain     PT Goals (current goals can now be found in the care plan section) Acute Rehab PT Goals Patient Stated Goal: unable to state goal PT Goal Formulation: Patient unable to participate in goal setting Time For Goal Achievement: 01/14/24 Progress towards PT goals: Progressing toward goals    Frequency    Min 2X/week       AM-PAC PT 6 Clicks Mobility   Outcome Measure  Help needed turning from your back to your side while in a flat bed without using bedrails?: A Lot Help needed moving from lying on your back to sitting on the side of a flat bed without using bedrails?: A Lot Help needed moving to and from a bed to a chair (including a wheelchair)?: Total Help needed standing up from a chair using your arms (e.g., wheelchair or bedside chair)?: Total Help needed to walk in hospital room?: Total Help needed climbing 3-5 steps with a railing? : Total 6 Click Score: 8    End of Session Equipment Utilized During Treatment: Gait belt Activity Tolerance: Patient limited by fatigue;Patient tolerated treatment well Patient left: with call bell/phone within reach;in bed;with bed alarm set;with family/visitor present Nurse Communication: Mobility status PT Visit Diagnosis: Unsteadiness on feet (R26.81);Other abnormalities of gait and mobility (R26.89);Muscle weakness (generalized) (M62.81);Hemiplegia and hemiparesis Hemiplegia - Right/Left: Left Hemiplegia - caused by: Cerebral infarction     Time: 8640-8570 PT Time Calculation (min) (ACUTE ONLY): 30 min  Charges:    $Therapeutic Activity: 23-37 mins PT General Charges $$ ACUTE PT VISIT: 1 Visit                     Darryle George, PTA Acute Rehabilitation Services Secure Chat Preferred  [phone]    Darryle George 01/04/2024, 3:58 PM

## 2024-01-04 NOTE — Discharge Summary (Signed)
 Physician Discharge Summary  Dustin Hamilton FMW:[id number] DOB: 29-Oct-1940 DOA: 12/30/2023  PCP: [facility]  Admit date: 12/30/2023 Discharge date: 01/04/2024  Admitted From: home Disposition:  inpatient rehab  Recommendations for Outpatient Follow-up:  Follow up with PCP in 1-2 weeks  Home Health: none Equipment/Devices: none  Discharge Condition: stable CODE STATUS: Full code Diet Orders (From admission, onward)     Start     Ordered   12/30/23 1044  Diet NPO time specified  Diet effective now        12/30/23 1044            Brief Narrative / Interim history: 83 year old male with HTN, HLD, TIA, prior CVA, amyloid angiopathy, paroxysmal SVT status post ablation, CAD 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  When he woke up on the day of admission had left-sided facial droop, weakness, right gaze deviation and was unable to speak.  At baseline he is confused but walking, able to feed self and have short conversation per his wife  Hospital Course / Discharge diagnoses: Principal Problem:   Focal neurological deficit Active Problems:   Essential hypertension   History of TIA (transient ischemic attack) and stroke   Benign prostatic hyperplasia with urinary obstruction   Paroxysmal SVT (supraventricular tachycardia) (HCC)   Complete heart block (HCC)   Dementia (HCC)   Glaucoma   Mixed hyperlipidemia   Obstructive sleep apnea   Secondary parkinsonism, unspecified (HCC)   Status cardiac pacemaker   Protein-calorie malnutrition, severe   Principal problem Acute stroke-MRI on admission with acute infarct in the right basal ganglia and remote left PCA territory infarcts, additional small remote infarct in the right occipital cortex within the right PCA territory.  Neurology consulted and followed patient while hospitalized.  CT angiogram with moderate stenosis of the distal M1  segment of the right MCA.  2D echo showed LVEF 55 to 60%, normal RV.  LDL is 67, A1c is 5.5.  Failed SLP, now has a core track.  If he does not improve we will need to convert to a PEG tube over the next week or 2. Stable to discharge to inpatient rehab, neurology recommends aspirin  and plavix  for 3 weeks followed by plavix  alone.    Active problems HTN-blood pressure is stable today, normotensive History of TIA/prior CVAs-noted Paroxysmal SVT-status post ablation.  No events on telemetry CHB-status post pacemaker Dementia, parkinsonism-continue home medications, he appears likely worse compared to baseline due to the new stroke BPH-unable to administer dutasteride  / finasteride , changed to bethanechol  per pharmacy  Sepsis ruled out   Discharge Instructions   Allergies as of 01/04/2024       Reactions   Penicillins Hives   Levetiracetam  Other (See Comments)   Altered mental status   Aricept  [donepezil ] Other (See Comments)   Dizziness   Exelon [rivastigmine] Other (See Comments)   Malaise   Viagra [sildenafil] Palpitations, Other (See Comments)   Dizziness, also        Medication List     STOP taking these medications    dutasteride  0.5 MG capsule Commonly known as: AVODART        TAKE these medications    acetaminophen  325 MG tablet Commonly known as: TYLENOL  Take 650 mg by mouth 3 (three) times daily as needed for moderate pain (pain score 4-6).   allopurinol  100 MG tablet Commonly known as: Zyloprim  Place 1 tablet (100 mg total) into feeding tube daily.  What changed: how to take this   amLODipine  5 MG tablet Commonly known as: NORVASC  Place 1 tablet (5 mg total) into feeding tube daily. What changed: how to take this   aspirin  81 MG chewable tablet Place 1 tablet (81 mg total) into feeding tube daily. Start taking on: January 05, 2024 What changed: how to take this   bethanechol  10 MG tablet Commonly known as: URECHOLINE  Place 1 tablet (10 mg total)  into feeding tube 3 (three) times daily.   brimonidine  0.2 % ophthalmic solution Commonly known as: ALPHAGAN  Place 1 drop into both eyes 2 (two) times daily.   clopidogrel  75 MG tablet Commonly known as: PLAVIX  Place 1 tablet (75 mg total) into feeding tube daily. Start taking on: January 05, 2024   colchicine  0.6 MG tablet Place 1 tablet (0.6 mg total) into feeding tube daily as needed. What changed:  how to take this when to take this reasons to take this   dorzolamide  2 % ophthalmic solution Commonly known as: TRUSOPT  Place 1 drop into both eyes 2 (two) times daily.   galantamine  8 MG tablet Commonly known as: RAZADYNE  Take 8 mg by mouth daily. Takes along with memantine    latanoprost  0.005 % ophthalmic solution Commonly known as: XALATAN  Place 1 drop into both eyes at bedtime.   melatonin 3 MG Tabs tablet Take 3 mg by mouth at bedtime.   memantine  10 MG tablet Commonly known as: NAMENDA  Take 10 mg by mouth 2 (two) times daily.   tamsulosin  0.4 MG Caps capsule Commonly known as: FLOMAX  Take 0.4 mg by mouth daily.         Consultations:   Procedures/Studies:  CT HEAD WO CONTRAST ( ) Result Date: 01/02/2024 CLINICAL DATA:  83 year old male with change in mental status. Delirium. Status post code stroke presentation on 12/30/2023, with MRI revealing right basal ganglia infarct. EXAM: CT HEAD WITHOUT CONTRAST TECHNIQUE: Contiguous axial images were obtained from the base of the skull through the vertex without intravenous contrast. RADIATION DOSE REDUCTION: This exam was performed according to the departmental dose-optimization program which includes automated exposure control, adjustment of the mA and/or kV according to patient size and/or use of iterative reconstruction technique. COMPARISON:  Brain MRI, head CT, CTA 12/30/2023. FINDINGS: Brain: Cytotoxic edema now evident throughout the right basal ganglia (series 3, image 19). No hemorrhagic transformation. Other  chronically advanced cerebral white matter hypodensity, areas of cortical encephalomalacia in the left parietal and occipital lobes appears stable. No midline shift, mass effect, or evidence of intracranial mass lesion. Stable ventricle size and configuration. No acute intracranial hemorrhage identified. Vascular: Calcified atherosclerosis at the skull base. No suspicious intracranial vascular hyperdensity. Skull: Stable and intact. Sinuses/Orbits: Left nasoenteric tube now in place. Visualized paranasal sinuses and mastoids are stable and well aerated. Other: Stable orbit and scalp soft tissues. IMPRESSION: 1. Expected evolution of the Right Basal Ganglia cytotoxic edema since 12/30/2023. No hemorrhagic transformation or intracranial mass effect. 2. Underlying advanced chronic ischemic disease. No new intracranial abnormality. Electronically Signed   By: VEAR Hurst M.D.   On: 01/02/2024 06:30   DG CHEST PORT 1 VIEW Result Date: 01/02/2024 EXAM: 1 VIEW XRAY OF THE CHEST 01/02/2024 05:17:00 AM COMPARISON: 11/10/2023 CLINICAL HISTORY: Hypotension. Respiratory distress. FINDINGS: LUNGS AND PLEURA: Mild subsegmental atelectasis in the right base. No focal pulmonary opacity. No pulmonary edema. No pleural effusion. No pneumothorax. HEART AND MEDIASTINUM: There is a left chest wall pacer device with lead in the right atrial appendage and right  ventricle. No acute abnormality of the cardiac and mediastinal silhouettes. BONES AND SOFT TISSUES: Unchanged Asymmetric elevation of the right hemidiaphragm. No acute osseous abnormality. LINES AND TUBES: Feeding tube drip projects over the anterolateral pyloric junction within the right upper quadrant of the abdomen. IMPRESSION: 1. Mild subsegmental atelectasis in the right base. Electronically signed by: Waddell Calk MD 01/02/2024 05:55 AM EDT RP Workstation: [id number]   DG Abd Portable 1V Result Date: 01/01/2024 CLINICAL DATA:  Enteric tube placement EXAM: PORTABLE ABDOMEN -  1 VIEW COMPARISON:  CT abdomen and pelvis dated 04/24/2023 FINDINGS: Gastric/enteric tube tip projects over the distal stomach/proximal duodenum. IMPRESSION: Gastric/enteric tube tip projects over the distal stomach/proximal duodenum. Electronically Signed   By: Limin  Xu M.D.   On: 01/01/2024 15:24   ECHOCARDIOGRAM COMPLETE Result Date: 12/31/2023    ECHOCARDIOGRAM REPORT   Patient Name:   Dustin Hamilton Date of Exam: 12/31/2023 Medical Rec #:  [id number]          Height:       71.0 in Accession #:    [id number]         Weight:       147.7 lb Date of Birth:  11/21/1940          BSA:          1.854 m Patient Age:    83 years           BP:           138/86 mmHg Patient Gender: M                  HR:           60 bpm. Exam Location:  Inpatient Procedure: 2D Echo, Cardiac Doppler and Color Doppler (Both Spectral and Color            Flow Doppler were utilized during procedure). Indications:    Stroke  History:        Patient has prior history of Echocardiogram examinations, most                 recent 08/07/2023. Risk Factors:Hypertension.  Sonographer:    Philomena Daring Referring Phys: [id number] MARSA NOVAK MELVIN  Sonographer Comments: Technically challenging study due to limited acoustic windows, Technically difficult study due to poor echo windows, suboptimal apical window and no subcostal window. Image acquisition challenging due to patient body habitus. IMPRESSIONS  1. Left ventricular ejection fraction, by estimation, is 55 to 60%. The left ventricle has normal function. Left ventricular endocardial border not optimally defined to evaluate regional wall motion. Left ventricular diastolic function could not be evaluated.  2. Right ventricular systolic function is normal. The right ventricular size is normal.  3. The mitral valve was not well visualized. No evidence of mitral valve regurgitation. No evidence of mitral stenosis.  4. The aortic valve is tricuspid. There is moderate calcification of the aortic valve.  Aortic valve regurgitation is not visualized. Aortic valve sclerosis/calcification is present, without any evidence of aortic stenosis. Conclusion(s)/Recommendation(s): Very limited echo due to pood sound wave transmission. LV never really seen well but in one image we have EF seems ok. FINDINGS  Left Ventricle: Left ventricular ejection fraction, by estimation, is 55 to 60%. The left ventricle has normal function. Left ventricular endocardial border not optimally defined to evaluate regional wall motion. The left ventricular internal cavity size was normal in size. There is no left ventricular hypertrophy. Left ventricular diastolic function could not be evaluated. Right  Ventricle: The right ventricular size is normal. No increase in right ventricular wall thickness. Right ventricular systolic function is normal. Left Atrium: Left atrial size was not well visualized. Right Atrium: Right atrial size was not well visualized. Pericardium: There is no evidence of pericardial effusion. Mitral Valve: The mitral valve was not well visualized. No evidence of mitral valve regurgitation. No evidence of mitral valve stenosis. Tricuspid Valve: The tricuspid valve is normal in structure. Tricuspid valve regurgitation is trivial. No evidence of tricuspid stenosis. Aortic Valve: The aortic valve is tricuspid. There is moderate calcification of the aortic valve. Aortic valve regurgitation is not visualized. Aortic valve sclerosis/calcification is present, without any evidence of aortic stenosis. Pulmonic Valve: The pulmonic valve was not assessed. Pulmonic valve regurgitation is not visualized. No evidence of pulmonic stenosis. Aorta: The aortic root is normal in size and structure. Venous: The inferior vena cava was not well visualized. IAS/Shunts: No atrial level shunt detected by color flow Doppler.  LEFT VENTRICLE PLAX 2D LVOT diam:     2.06 cm LVOT Area:     3.33 cm   AORTA Ao Root diam: 3.03 cm  SHUNTS Systemic Diam: 2.06  cm Toribio Fuel MD Electronically signed by Toribio Fuel MD Signature Date/Time: 12/31/2023/10:08:09 AM    Final    MR BRAIN WO CONTRAST Result Date: 12/30/2023 EXAM: MRI BRAIN WITHOUT CONTRAST 12/30/2023 05:45:53 PM TECHNIQUE: Multiplanar multisequence MRI of the head/brain was performed without the administration of intravenous contrast. COMPARISON: CT head and CTA head and neck dated 12/30/2023. MRI head dated 12/30/2023. CLINICAL HISTORY: Neuro deficit, acute, stroke suspected. Stroke rule out. FINDINGS: BRAIN AND VENTRICLES: Remote left PCA (posterior cerebral artery) territory infarcts. There are nonspecific hyperintense foci in the subcortical and periventricular white matter that most likely represent chronic microangiopathic ischemic changes in a patient of this age. Moderate generalized parenchymal volume loss. Small remote infarct in the left centrum semiovale. There are numerous scattered chronic micro hemorrhages in the cerebral hemispheres particularly within the temporal and occipital lobes with additional chronic micro hemorrhages in the left dorsal midbrain as well as within the bilateral cerebellum. There is restricted diffusion within the right basal ganglia primarily involving the superior aspect of the lentiform nucleus as well as the right caudate head extending into the body of the right caudate nucleus. Additional small remote infarcts in the right occipital cortex within the right PCA territory. ORBITS: No acute abnormality. SINUSES AND MASTOIDS: Bilateral mastoid effusions. BONES AND SOFT TISSUES: Normal marrow signal. No acute soft tissue abnormality. IMPRESSION: 1. Acute infarct in the right basal ganglia. 2. Remote left PCA territory infarcts and additional small remote infarcts in the right occipital cortex within the right PCA territory. 3. 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 4. Moderate generalized parenchymal  volume loss. Electronically signed by: Donnice Mania MD 12/30/2023 06:56 PM EDT RP Workstation: [id number]   CT ANGIO HEAD NECK W WO CM Result Date: 12/30/2023 EXAM: CTA HEAD AND NECK WITH AND WITHOUT 12/30/2023 04:41:29 PM TECHNIQUE: CTA of the head and neck was performed with and without the administration of intravenous contrast. Multiplanar 2D and/or 3D reformatted images are provided for review. Automated exposure control, iterative reconstruction, and/or weight based adjustment of the mA/kV was utilized to reduce the radiation dose to as low as reasonably achievable. Stenosis of the internal carotid arteries measured using NASCET criteria. COMPARISON: 12/15/2022 CLINICAL HISTORY: Neuro deficit, acute, stroke suspected. FINDINGS: CTA NECK: AORTIC ARCH AND ARCH VESSELS: No dissection or arterial  injury. No significant stenosis of the brachiocephalic or subclavian arteries. CERVICAL CAROTID ARTERIES: Mild atherosclerosis at the right carotid bifurcation without hemodynamically significant stenosis. Additional mild atherosclerosis at the left carotid bifurcation without hemodynamically significant stenosis. CERVICAL VERTEBRAL ARTERIES: Tortuosity of the right V1 segment. Also tortuosity of the left V1 segment and the bilateral V2 segments. Atherosclerosis of the bilateral V4 segments resulting in focal moderate stenosis of the V4 segment right vertebral artery. LUNGS AND MEDIASTINUM: Unremarkable. SOFT TISSUES: Multiple subcentimeter thyroid  nodules. No cervical lymphadenopathy. BONES: No acute or aggressive finding of the osseous structures. 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 CTA HEAD: ANTERIOR CIRCULATION: No significant stenosis of the internal carotid arteries. The ACAs are patent bilaterally. There is focal moderate stenosis of the distal right M1 segment which is new from  prior. The MCAs are otherwise patent and normal in caliber. POSTERIOR CIRCULATION: Redemonstrated occlusion of the proximal P2 segment of the left PCA with reconstitution at the mid P2 segment. The PCAs are otherwise patent bilaterally. No significant stenosis of the basilar artery. No aneurysm. OTHER: No dural venous sinus thrombosis on this non-dedicated study. Mild atherosclerosis of the carotid siphons without stenosis. IMPRESSION: 1. New focal moderate stenosis of the distal M1 segment of the right MCA. 2. Redemonstrated occlusion of the proximal P2 segment of the left PCA with reconstitution at the mid P2 segment. 3. Focal moderate stenosis of the V4 segment right vertebral artery. 4. Mild atherosclerosis at the right and left carotid bifurcations without hemodynamically significant stenosis. Electronically signed by: Donnice Mania MD 12/30/2023 05:03 PM EDT RP Workstation: [id number]   CT HEAD CODE STROKE WO CONTRAST` Result Date: 12/30/2023 CLINICAL DATA:  Code stroke. 83 year old male with weakness and leftward gaze. EXAM: CT HEAD WITHOUT CONTRAST TECHNIQUE: Contiguous axial images were obtained from the base of the skull through the vertex without intravenous contrast. RADIATION DOSE REDUCTION: This exam was performed according to the departmental dose-optimization program which includes automated exposure control, adjustment of the mA and/or kV according to patient size and/or use of iterative reconstruction technique. COMPARISON:  Brain MRI 03/17/2023.  Head CT 10/20/2023. FINDINGS: Brain: Stable cerebral volume. Chronic encephalomalacia left PCA territory. Chronic nonspecific ventriculomegaly. Chronic bilateral cerebral white matter hypodensity which is confluent. Chronic heterogeneity in the deep gray nuclei. Stable gray-white matter differentiation throughout the brain. No midline shift, mass effect, evidence of mass lesion, intracranial hemorrhage or evidence of cortically based acute infarction.  Vascular: Advanced Calcified atherosclerosis at the skull base. No suspicious intracranial vascular hyperdensity. Skull: Stable and intact. Sinuses/Orbits: Visualized paranasal sinuses and mastoids are stable and well aerated. Other: No gaze deviation at this time. No acute scalp soft tissue findings. ASPECTS [facility] Stroke Program Early CT Score) Total score (0-10 with 10 being normal): 10 IMPRESSION: 1. No acute cortically based infarct or acute intracranial hemorrhage identified. ASPECTS 10. 2.  Stable non contrast CT appearance of chronic ischemic disease. 3. These results were communicated to Dr. Vanessa at 10:57 am on 12/30/2023 by text page via the [facility] messaging system. Electronically Signed   By: VEAR Hurst M.D.   On: 12/30/2023 10:58     Subjective: - no chest pain, shortness of breath, no abdominal pain, nausea or vomiting.   Discharge Exam: BP 121/82 (BP Location: Right Arm)   Pulse 81   Temp 98.7 F (37.1 C) (Oral)   Resp 20   Ht 5' 11 (1.803 m)   Wt  67.6 kg   SpO2 96%   BMI 20.78 kg/m   General: Pt is alert, awake, not in acute distress Cardiovascular: RRR, S1/S2 +, no rubs, no gallops Respiratory: CTA bilaterally, no wheezing, no rhonchi Abdominal: Soft, NT, ND, bowel sounds + Extremities: no edema, no cyanosis    The results of significant diagnostics from this hospitalization (including imaging, microbiology, ancillary and laboratory) are listed below for reference.     Microbiology: Recent Results (from the past 240 hours)  SARS Coronavirus 2 by RT PCR (hospital order, performed in [facility] hospital lab) *cepheid single result test*     Status: None   Collection Time: 12/30/23 11:00 PM  Result Value Ref Range Status   SARS Coronavirus 2 by RT PCR NEGATIVE NEGATIVE Final    Comment: Performed at [facility], [address] [place], [place] [address]     Labs: Basic Metabolic Panel: Recent Labs  Lab 12/30/23 1044 12/30/23 1050 12/31/23 0822  01/02/24 0258 01/02/24 0510 01/03/24 0329  NA 144 150* 142 144 144 143  K 3.3* 2.7* 3.6 3.6 3.7 3.7  CL 117* 117* 108 111 109 111  CO2 23  --  23 24 25 23   GLUCOSE 86 71 100* 156* 137* 144*  BUN 16 13 14 23  24* 27*  CREATININE 0.78 0.60* 1.03 1.24 1.36* 0.99  CALCIUM  8.0*  --  10.1 9.9 10.1 9.6  MG 1.6*  --   --  2.1  --  2.0   Liver Function Tests: Recent Labs  Lab 12/30/23 1044 12/31/23 0822 01/02/24 0258 01/02/24 0510 01/03/24 0329  AST 17 18 33 34 30  ALT 14 17 27 31  35  ALKPHOS 50 76 61 63 55  BILITOT 0.9 1.4* 1.5* 1.4* 0.8  PROT 5.6* 7.4 6.8 7.1 6.2*  ALBUMIN 2.9* 3.8 3.4* 3.5 3.0*   CBC: Recent Labs  Lab 12/30/23 1044 12/30/23 1050 12/31/23 0822 01/02/24 0258 01/02/24 0510 01/03/24 0329  WBC 4.7  --  8.1 9.3 7.3 6.7  NEUTROABS 3.5  --   --   --   --   --   HGB 11.8* 12.2* 17.3* 16.0 18.5* 15.0  HCT 35.6* 36.0* 50.8 47.6 54.5* 44.6  MCV 96.5  --  92.9 94.3 95.1 94.1  PLT 92*  --  146* 146* 113* 124*   CBG: Recent Labs  Lab 01/03/24 1603 01/03/24 1938 01/03/24 2351 01/04/24 0253 01/04/24 0740  GLUCAP 140* 146* 156* 143* 176*   Hgb A1c No results for input(s): HGBA1C in the last 72 hours. Lipid Profile No results for input(s): CHOL, HDL, LDLCALC, TRIG, CHOLHDL, LDLDIRECT in the last 72 hours. Thyroid  function studies No results for input(s): TSH, T4TOTAL, T3FREE, THYROIDAB in the last 72 hours.  Invalid input(s): FREET3 Urinalysis    Component Value Date/Time   COLORURINE STRAW (A) 12/30/2023 1340   APPEARANCEUR CLEAR 12/30/2023 1340   LABSPEC 1.010 12/30/2023 1340   PHURINE 7.0 12/30/2023 1340   GLUCOSEU NEGATIVE 12/30/2023 1340   GLUCOSEU NEGATIVE 02/17/2011 0930   HGBUR MODERATE (A) 12/30/2023 1340   BILIRUBINUR NEGATIVE 12/30/2023 1340   KETONESUR NEGATIVE 12/30/2023 1340   PROTEINUR 30 (A) 12/30/2023 1340   UROBILINOGEN 1.0 09/12/2012 1802   NITRITE NEGATIVE 12/30/2023 1340   LEUKOCYTESUR SMALL (A)  12/30/2023 1340    FURTHER DISCHARGE INSTRUCTIONS:   Get Medicines reviewed and adjusted: Please take all your medications with you for your next visit with your Primary MD   Laboratory/radiological data: Please request your  Primary MD to go over all hospital tests and procedure/radiological results at the follow up, please ask your Primary MD to get all Hospital records sent to his/her office.   In some cases, they will be blood work, cultures and biopsy results pending at the time of your discharge. Please request that your primary care M.D. goes through all the records of your hospital data and follows up on these results.   Also Note the following: If you experience worsening of your admission symptoms, develop shortness of breath, life threatening emergency, suicidal or homicidal thoughts you must seek medical attention immediately by calling 911 or calling your MD immediately  if symptoms less severe.   You must read complete instructions/literature along with all the possible adverse reactions/side effects for all the Medicines you take and that have been prescribed to you. Take any new Medicines after you have completely understood and accpet all the possible adverse reactions/side effects.    Do not drive when taking Pain medications or sleeping medications (Benzodaizepines)   Do not take more than prescribed Pain, Sleep and Anxiety Medications. It is not advisable to combine anxiety,sleep and pain medications without talking with your primary care practitioner   Special Instructions: If you have smoked or chewed Tobacco  in the last 2 yrs please stop smoking, stop any regular Alcohol   and or any Recreational drug use.   Wear Seat belts while driving.   Please note: You were cared for by a hospitalist during your hospital stay. Once you are discharged, your primary care physician will handle any further medical issues. Please note that NO REFILLS for any discharge medications will  be authorized once you are discharged, as it is imperative that you return to your primary care physician (or establish a relationship with a primary care physician if you do not have one) for your post hospital discharge needs so that they can reassess your need for medications and monitor your lab values.  Time coordinating discharge: 35 minutes  SIGNED:  Nilda Fendt, MD, PhD 01/04/2024, 11:00 AM

## 2024-01-04 NOTE — H&P (Signed)
 Physical Medicine and Rehabilitation Admission H&P        Chief Complaint  Patient presents with   Stroke with functional decline.       HPI: Dustin Hamilton is an 83 year old male with history of CVA, ICH, SVT, CHB s/p PPM,  fatty liver, Parkinsonism, dementia w/sundowning, cerebral angiopathy w/multiple amyloid spells (transient focal neurologic deficits), orthostatic hypotension w/syncope;  who was presented to ED on 12/30/23 with decrease in LOC, left sided weakness, right gaze deviation and left facial droop. CTA head/neck showed new focal moderate stenosis of distal M1 segment of R-MCA, old P2 segment occlusion of L-PCA and focal moderate stenosis of V4 segment of R-VA.  MRI brain done reveling acute infarct in Right basal ganglia with remote L-PCA territory infarcts, numerous chronic microhemorrhages c/w hx of cerebral amyloid angiopathy and moderate generalized parenchymal volume loss.  2 D echo showed EF 55-65% with moderate calcification of R-AV and negative for shunt but study limited due to body habitus.    Dr. Rosemarie recommends DAPT X 3 weeks followed by Plavix  alone. Hospital course significant for pseudobulbar affect with non verbal state, increased tone in all extremities with drift to the left, ability to intermittently follow simple commands, tends to keep eyes closed and opens mouth reflexively but with poor oral awareness and significant oral holding. He was made NPO and started on tube feeds for nutritional support.  He did have decrease in responsiveness with hypotension on early am of 08/02 and improved with  fluid bolus X 1 liter. Noted elevated lactate and to be dehydrated. CXR with mild Right base atelectasis.  CT head showed expected evolution without hemorrhagic transformation.    PT/OT/ST has been working with patient who requires cues to keep eyes open, is limited by cognitive deficits with poor initiation, requires  max to total assist for bed mobility, +2 mod assist  to sit at EOB with left lateral lean. for bed mobility and total assist with ADLs. Prior to admission, he was able to ambulate with rollater in home setting and  needed assistance with ADLs/IADLs. He has a Comptroller 4 hours/7 days a week. CIR recommended due to functional decline.        Review of Systems  Unable to perform ROS: Mental acuity           Past Medical History:  Diagnosis Date   AMS (altered mental status) 03/15/2023   ARF (acute renal failure) (HCC) 09/30/2023   BPH (benign prostatic hyperplasia)     Cerebral infarction (HCC) 09/13/2012    IMO SNOMED Dx Update Oct 2024     Cerebrovascular disease     Clostridium difficile diarrhea     Congenital anomaly of diaphragm     Elevated PSA     Glaucoma, both eyes     Hemorrhoid     Hepatitis B surface antigen positive      02-20-2011   History of adenomatous polyp of colon      2007, 2009 and 2013  tubular adenoma's   History of alcohol  abuse      quit 1963   History of cerebral parenchymal hemorrhage      01/ 2006  left occiptial lobe related to hypertensive crisis   History of CVA (cerebrovascular accident)      09-12-2012  left hippocampus/ amygdala junction and per MRI old white matter infarcts--  per pt residual short- term memory issues   History of fatty infiltration of liver hx visit's at [facility] , last visit 05/ 2014    elvated LFT's ,  via liver bx 2004 related to hx alcohol  and drug abuse (quit 1964)   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   HTN (hypertension)     Renal cyst, left     Sepsis (HCC) 06/26/2021   Stroke (HCC)      hx of 3 strokes in past    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     Urethral lesion      urethral mass   UTI (urinary tract infection) 03/15/2023               Past Surgical History:  Procedure Laterality Date   CARDIOVASCULAR STRESS TEST   05/05/2007    normal nuclear study w/ no ischemia/  normal LV  fucntion and wall motion , ef60%   COLONOSCOPY   last one 04-06-2012   CYSTO/  LEFT RETROGRADE PYELOGRAM/ CYTOLOGY WASHINGS/  URETEROSCOPY   03/05/2000   INGUINAL HERNIA REPAIR Bilateral 1965 and 1980's   IR 3D INDEPENDENT WKST   07/31/2022   IR ANGIOGRAM PELVIS SELECTIVE OR SUPRASELECTIVE   07/31/2022   IR ANGIOGRAM SELECTIVE EACH ADDITIONAL VESSEL   07/31/2022   IR ANGIOGRAM SELECTIVE EACH ADDITIONAL VESSEL   07/31/2022   IR ANGIOGRAM SELECTIVE EACH ADDITIONAL VESSEL   07/31/2022   IR ANGIOGRAM SELECTIVE EACH ADDITIONAL VESSEL   07/31/2022   IR EMBO TUMOR ORGAN ISCHEMIA INFARCT INC GUIDE ROADMAPPING   07/31/2022   IR RADIOLOGIST EVAL & MGMT   06/26/2022   IR RADIOLOGIST EVAL & MGMT   08/08/2022   IR RADIOLOGIST EVAL & MGMT   08/21/2022   IR RADIOLOGIST EVAL & MGMT   03/25/2023   IR US  GUIDE VASC ACCESS RIGHT   07/31/2022   LAPAROSCOPIC INGUINAL HERNIA WITH UMBILICAL HERNIA Right 06/24/2007   LIVER BIOPSY   1980's and 2004   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    Procedure: PACEMAKER IMPLANT;  Surgeon: Waddell Danelle ORN, MD;  Location: [facility];  Service: Cardiovascular;  Laterality: N/A;   SVT ABLATION N/A 11/15/2018    Procedure: SVT ABLATION;  Surgeon: Waddell Danelle ORN, MD;  Location: [facility];  Service: Cardiovascular;  Laterality: N/A;   TRANSTHORACIC ECHOCARDIOGRAM   09/13/2012    moderate LVH,  ef 60-65%/     TRANSURETHRAL RESECTION OF BLADDER TUMOR N/A 08/11/2016    Procedure: TRANSURETHRAL RESECTION OF BLADDER TUMOR (TURBT);  Surgeon: Belvie LITTIE Clara, MD;  Location: [facility];  Service: Urology;  Laterality: N/A;               Family History  Problem Relation Age of Onset   Rheum arthritis Mother     Diabetes Mother     Stroke Mother     Heart attack Mother     Kidney failure Mother     Heart attack Father     Heart disease Maternal Grandmother     Rheum arthritis Maternal Grandmother     Diabetes Maternal Grandmother     Stroke  Maternal Grandmother     Colon cancer Neg Hx            Social History:  Married. 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  reports that he quit smoking about 42 years ago. His smoking use included cigarettes. He started smoking about 47 years ago. He  has a 5 pack-year smoking history. He has never used smokeless tobacco. He reports that he does not drink alcohol  and does not use drugs.     Allergies       Allergies  Allergen Reactions   Penicillins Hives   Levetiracetam  Other (See Comments)      Altered mental status   Aricept  [Donepezil ] Other (See Comments)      Dizziness     Exelon [Rivastigmine] Other (See Comments)      Malaise     Viagra [Sildenafil] Palpitations and Other (See Comments)      Dizziness, also                Medications Prior to Admission  Medication Sig Dispense Refill   acetaminophen  (TYLENOL ) 325 MG tablet Take 650 mg by mouth 3 (three) times daily as needed for moderate pain (pain score 4-6).       allopurinol  (ZYLOPRIM ) 100 MG tablet Take 1 tablet (100 mg total) by mouth daily. 30 tablet 2   amLODipine  (NORVASC ) 5 MG tablet Take 5 mg by mouth daily.       aspirin  81 MG chewable tablet Chew 1 tablet (81 mg total) by mouth daily.       brimonidine  (ALPHAGAN ) 0.2 % ophthalmic solution Place 1 drop into both eyes 2 (two) times daily. 5 mL 12   colchicine  0.6 MG tablet Take 1 tablet (0.6 mg total) by mouth daily. (Patient taking differently: Take 0.6 mg by mouth daily as needed.) 30 tablet 0   dorzolamide  (TRUSOPT ) 2 % ophthalmic solution Place 1 drop into both eyes 2 (two) times daily.       dutasteride  (AVODART ) 0.5 MG capsule Take 0.5 mg by mouth in the morning.       galantamine  (RAZADYNE ) 8 MG tablet Take 8 mg by mouth daily. Takes along with memantine        latanoprost  (XALATAN ) 0.005 % ophthalmic solution Place 1 drop into both eyes at bedtime.       melatonin 3 MG TABS tablet Take 3 mg by mouth at bedtime.       memantine   (NAMENDA ) 10 MG tablet Take 10 mg by mouth 2 (two) times daily.       tamsulosin  (FLOMAX ) 0.4 MG CAPS capsule Take 0.4 mg by mouth daily.                Home: Home Living Family/patient expects to be discharged to:: Private residence Living Arrangements: Spouse/significant other Available Help at Discharge: Family, Available 24 hours/day Type of Home: House Home Access: Ramped entrance Home Layout: One level Bathroom Shower/Tub: Health visitor: Standard Bathroom Accessibility: Yes Home Equipment: Grab bars - toilet, Grab bars - tub/shower, BSC/3in1, Hand held shower head, Cane - single point, Rollator (4 wheels) Additional Comments: Aide x3 hrs 4day/wk. Pt with limited communication, PLOF and setup from past notes   Functional History: Prior Function Prior Level of Function : Needs assist Mobility Comments: uses rollator for mobility ADLs Comments: assist with bathing tasks, wears briefs at baseline. pt reports wife will assist with dressing as needed. Aide/family assist with IADLs   Functional Status:  Mobility: Bed Mobility Overal bed mobility: Needs Assistance Bed Mobility: Supine to Sit, Sit to Supine Supine to sit: Max assist Sit to supine: Total assist General bed mobility comments: able to assist minimally with bringing R LE towards EOB, MaxA for remainder of transfer. Initial left lateral lean requiring ModA to support, total A to return to  supine Transfers Overall transfer level: Needs assistance Equipment used: 2 person hand held assist Transfers: Bed to chair/wheelchair/BSC Sit to Stand: Max assist, +2 physical assistance, +2 safety/equipment Bed to/from chair/wheelchair/BSC transfer type:: Squat pivot Stand pivot transfers: Max assist, +2 physical assistance, +2 safety/equipment Squat pivot transfers: Max assist, +2 physical assistance, +2 safety/equipment Transfer via Lift Equipment: Stedy General transfer comment: NT this session as +2 assist  not available Ambulation/Gait General Gait Details: unable this date   ADL: ADL Grooming: Maximal assistance, Sitting Grooming Details (indicate cue type and reason): max A for thoroughness, initiated with hand over hand assist and placement of cloth in hand. Able to bring washcloth to face with light AAROM using RUE. General ADL Comments: Total A for all other ADLs.   Cognition: Cognition Overall Cognitive Status: Impaired/Different from baseline Arousal/Alertness: Awake/alert Orientation Level: Oriented to person, Disoriented to place, Disoriented to time, Disoriented to situation Attention: Sustained Sustained Attention: Impaired Sustained Attention Impairment: Functional basic, Verbal basic Problem Solving: Impaired Problem Solving Impairment: Functional basic Cognition Arousal: Lethargic Behavior During Therapy: Flat affect Overall Cognitive Status: Impaired/Different from baseline     Blood pressure 121/82, pulse 81, temperature 98.7 F (37.1 C), temperature source Oral, resp. rate 20, height 5' 11 (1.803 m), weight 67.6 kg, SpO2 96%. Physical Exam Constitutional:      Appearance: He is underweight.     Comments: Frail appearing male with cortak in nares and mitten on right hand.  Keeps eyes closed but able to open right with cues.   Neurological:     Mental Status: He is alert.     Comments: Left facial weakness with ptosis. Able to open eyes with max cues to activate. Moderate to severe dysarthria with oral secretions and drooling after few one word answers. He did not make eye contact and was unable to follow simple one step motor commands without tactile cues. Able to raise RUE and RLE. Tends to lean to the left with extensor tone. He was able to state his name and partially state DOB at [birth year] but not year or age.     General: No acute distress Mood and affect flat Heart: Regular rate and rhythm no rubs murmurs or extra sounds Lungs: Clear to auscultation, breathing  unlabored, no rales or wheezes Abdomen: Positive bowel sounds, soft nontender to palpation, nondistended Extremities: No clubbing, cyanosis, or edema Skin: No evidence of breakdown, no evidence of rash Neurologic: Cranial nerves II through XII intact, motor strength is 4/5 in right deltoid, bicep, tricep, grip, hip flexor, knee extensors, ankle dorsiflexor and plantar flexor, 0/5 LUE and LLE Sensory exam limited by attention and concentration  Cerebellar exam could not cooperate Musculoskeletal: Full range of motion in all 4 extremities. No joint swelling      Lab Results Last 48 Hours        Results for orders placed or performed during the hospital encounter of 12/30/23 (from the past 48 hours)  Glucose, capillary     Status: Abnormal    Collection Time: 01/02/24 12:07 PM  Result Value Ref Range    Glucose-Capillary 131 (H) 70 - 99 mg/dL      Comment: Glucose reference range applies only to samples taken after fasting for at least 8 hours.    Comment 1 Notify RN    Lactic acid, plasma     Status: Abnormal    Collection Time: 01/02/24  2:29 PM  Result Value Ref Range    Lactic Acid, Venous 2.5 (HH) 0.5 -  1.9 mmol/L      Comment: CRITICAL VALUE NOTED. VALUE IS CONSISTENT WITH PREVIOUSLY REPORTED/CALLED VALUE Performed at [facility], [address], [place], [place] [address]    Troponin I (High Sensitivity)     Status: None    Collection Time: 01/02/24  2:29 PM  Result Value Ref Range    Troponin I (High Sensitivity) 17 <18 ng/L      Comment: (NOTE) Elevated high sensitivity troponin I (hsTnI) values and significant  changes across serial measurements may suggest ACS but many other  chronic and acute conditions are known to elevate hsTnI results.  Refer to the Links section for chest pain algorithms and additional  guidance. Performed at [facility], [address], [place], [place] [address]    Glucose, capillary     Status: None    Collection Time: 01/02/24   3:54 PM  Result Value Ref Range    Glucose-Capillary 99 70 - 99 mg/dL      Comment: Glucose reference range applies only to samples taken after fasting for at least 8 hours.    Comment 1 Notify RN    Glucose, capillary     Status: Abnormal    Collection Time: 01/02/24  8:14 PM  Result Value Ref Range    Glucose-Capillary 153 (H) 70 - 99 mg/dL      Comment: Glucose reference range applies only to samples taken after fasting for at least 8 hours.  Glucose, capillary     Status: Abnormal    Collection Time: 01/03/24 12:08 AM  Result Value Ref Range    Glucose-Capillary 153 (H) 70 - 99 mg/dL      Comment: Glucose reference range applies only to samples taken after fasting for at least 8 hours.  Comprehensive metabolic panel with GFR     Status: Abnormal    Collection Time: 01/03/24  3:29 AM  Result Value Ref Range    Sodium 143 135 - 145 mmol/L    Potassium 3.7 3.5 - 5.1 mmol/L    Chloride 111 98 - 111 mmol/L    CO2 23 22 - 32 mmol/L    Glucose, Bld 144 (H) 70 - 99 mg/dL      Comment: Glucose reference range applies only to samples taken after fasting for at least 8 hours.    BUN 27 (H) 8 - 23 mg/dL    Creatinine, Ser 9.00 0.61 - 1.24 mg/dL    Calcium  9.6 8.9 - 10.3 mg/dL    Total Protein 6.2 (L) 6.5 - 8.1 g/dL    Albumin 3.0 (L) 3.5 - 5.0 g/dL    AST 30 15 - 41 U/L    ALT 35 0 - 44 U/L    Alkaline Phosphatase 55 38 - 126 U/L    Total Bilirubin 0.8 0.0 - 1.2 mg/dL    GFR, Estimated >39 >39 mL/min      Comment: (NOTE) Calculated using the CKD-EPI Creatinine Equation (2021)      Anion gap 9 5 - 15      Comment: Performed at [facility], [address] [place], [place] [address]  CBC     Status: Abnormal    Collection Time: 01/03/24  3:29 AM  Result Value Ref Range    WBC 6.7 4.0 - 10.5 K/uL    RBC 4.74 4.22 - 5.81 MIL/uL    Hemoglobin 15.0 13.0 - 17.0 g/dL    HCT 55.3 60.9 - 47.9 %  MCV 94.1 80.0 - 100.0 fL    MCH 31.6 26.0 - 34.0 pg    MCHC 33.6 30.0 - 36.0 g/dL     RDW 86.3 88.4 - 84.4 %    Platelets 124 (L) 150 - 400 K/uL    nRBC 0.0 0.0 - 0.2 %      Comment: Performed at [facility], [address], [place], [place] [address]  Magnesium      Status: None    Collection Time: 01/03/24  3:29 AM  Result Value Ref Range    Magnesium  2.0 1.7 - 2.4 mg/dL      Comment: Performed at [facility], [address], [place], [place] [address]  Glucose, capillary     Status: Abnormal    Collection Time: 01/03/24  3:44 AM  Result Value Ref Range    Glucose-Capillary 144 (H) 70 - 99 mg/dL      Comment: Glucose reference range applies only to samples taken after fasting for at least 8 hours.  Glucose, capillary     Status: Abnormal    Collection Time: 01/03/24  7:49 AM  Result Value Ref Range    Glucose-Capillary 160 (H) 70 - 99 mg/dL      Comment: Glucose reference range applies only to samples taken after fasting for at least 8 hours.    Comment 1 Notify RN      Comment 2 Document in Chart    Glucose, capillary     Status: Abnormal    Collection Time: 01/03/24 12:40 PM  Result Value Ref Range    Glucose-Capillary 154 (H) 70 - 99 mg/dL      Comment: Glucose reference range applies only to samples taken after fasting for at least 8 hours.    Comment 1 Notify RN      Comment 2 Document in Chart    Glucose, capillary     Status: Abnormal    Collection Time: 01/03/24  4:03 PM  Result Value Ref Range    Glucose-Capillary 140 (H) 70 - 99 mg/dL      Comment: Glucose reference range applies only to samples taken after fasting for at least 8 hours.    Comment 1 Notify RN      Comment 2 Document in Chart    Glucose, capillary     Status: Abnormal    Collection Time: 01/03/24  7:38 PM  Result Value Ref Range    Glucose-Capillary 146 (H) 70 - 99 mg/dL      Comment: Glucose reference range applies only to samples taken after fasting for at least 8 hours.    Comment 1 Notify RN      Comment 2 Document in Chart    Glucose, capillary     Status:  Abnormal    Collection Time: 01/03/24 11:51 PM  Result Value Ref Range    Glucose-Capillary 156 (H) 70 - 99 mg/dL      Comment: Glucose reference range applies only to samples taken after fasting for at least 8 hours.    Comment 1 Notify RN      Comment 2 Document in Chart    Glucose, capillary     Status: Abnormal    Collection Time: 01/04/24  2:53 AM  Result Value Ref Range    Glucose-Capillary 143 (H) 70 - 99 mg/dL      Comment: Glucose reference range applies only to samples taken after fasting for at least 8 hours.    Comment 1 Notify  RN      Comment 2 Document in Chart    Glucose, capillary     Status: Abnormal    Collection Time: 01/04/24  7:40 AM  Result Value Ref Range    Glucose-Capillary 176 (H) 70 - 99 mg/dL      Comment: Glucose reference range applies only to samples taken after fasting for at least 8 hours.    Comment 1 Notify RN      Comment 2 Document in Chart        Imaging Results (Last 48 hours)  No results found.         Blood pressure 121/82, pulse 81, temperature 98.7 F (37.1 C), temperature source Oral, resp. rate 20, height 5' 11 (1.803 m), weight 67.6 kg, SpO2 96%.   Medical Problem List and Plan: 1. Functional deficits secondary to RIght basal ganglia infarct             -patient may  shower             -ELOS/Goals: 21-24d Mod A x 1 goals 2.  Antithrombotics: -DVT/anticoagulation:  Pharmaceutical: Lovenox              -antiplatelet therapy: ASA/Plavix  X 3 weeks followed by Plavix  alone 3. Pain Management: Tylenol  prn.  4. Mood/Behavior/Sleep: LCSW to follow for evaluation and support.             --hx of sundowning-->continue melatonin 3 mg/Monitor sleep hygeine             -antipsychotic agents: N/A 5. Neuropsych/cognition: This patient is not capable of making decisions on fully own behalf. 6. Skin/Wound Care:  Routine pressure relief measures.  7. Fluids/Electrolytes/Nutrition: Continue NPO due to holding of orals.              --on tube  feeds.  8. HTN: Monitor BP TID--goal 150 to avoid symptoms (multiple ED visits this year)             --hx of chronic dizziness. Needs to stay hydrated. 9. H/o SVT/CHF s/p PPM/syncope: Monitor for orthostatic symptoms  10. BPH/urinary retention: Urecholine  and Proscar  added on 08/04 (cannot use Avodart  capsule due to TF).  11. Dementia: On Namenda ,  12. Glaucoma: Resume home meds. 13. Dysphagia: NPO with tube feeds. May need PEG for nutritional support.  14. Acute renal failure: Improved rom 24/1.36 to 27/0.99 13. Acute on chronic Thrombocytopenia?: Platelets 92 @ 7/30 to 124 @ 08/04 --has ranged from 120-150 with past year    Sharlet GORMAN Schmitz, PA-C 01/04/2024 I have personally performed a face to face diagnostic evaluation of this patient.  Additionally, I have reviewed and concur with the physician assistant's documentation above. Prentice CHARLENA Compton M.D. [facility] Fellow Am Acad of Phys Med and Rehab Diplomate Am Board of Electrodiagnostic Med Fellow Am Board of Interventional Pain

## 2024-01-04 NOTE — Progress Notes (Signed)
 PMR Admission Coordinator Pre-Admission Assessment   Patient: Dustin Hamilton is an 83 y.o., male MRN: [id number] DOB: [birth year] Height: 5' 11 (180.3 cm) Weight: 67.6 kg   Insurance Information HMO:     PPO:      PCP:      IPA:      80/20:      OTHER:  PRIMARY: [facility]      Policy#: [id number]       Subscriber: Pt  CM Name:       Phone#:   Approved on 12/18/23   approved 01/01/24 Benefits:  Phone #: [phone]     Name: [facility] Date: 06/03/23     Deduct:       Out of Pocket Max:       Life Max:  CIR:       SNF:  Outpatient:      Co-Pay:  Home Health:       Co-Pay:  DME:      Co-Pay:  Providers: in network  SECONDARY: [employer]      Policy#: [id number]      Phone#:    Financial Counselor:       Phone#:    The "Data Collection Information Summary" for patients in Inpatient Rehabilitation Facilities with attached "Privacy Act Statement-Health Care Records" was provided and verbally reviewed with: Pt   Emergency Contact Information Contact Information       Name Relation Home Work Mobile    Ritchey Spouse [phone]   [phone]         Other Contacts       Name Relation Home Work Mobile    Catalina,[name] Son     [phone]           Current Medical History  Patient Admitting Diagnosis: CVA History of Present Illness:  Dustin Hamilton is a 83 y.o. male with medical history significant of hypertension, hyperlipidemia, TIA, CVA, amyloid angiopathy, paroxysmal SVT status post ablation, complete heart block status post pacemaker, secondary parkinsonism, dementia, depression, OSA, gout, BPH, glaucoma, chronic pain who presented to [facility] on 12/30/23 with left-sided deficits as a code stroke.  Vital signs in the ED notable for blood pressure in the 120s-160s systolic.  Lab workup included CMP with potassium 3.3, chloride 117, calcium  8.0, protein 5.6, albumin 2.9.  CBC with hemoglobin 11.8 down from baseline  of 15-16, platelets 92.  PT 16.3, INR normal, PTT normal.  Ethanol level negative.  Urinalysis with hemoglobin, protein, small leukocytes, rare bacteria.  CT head showed no acute abnormality. CTA head & neck New focal moderate stenosis of the distal M1 segment of the right MCA. Redemonstrated occlusion of the proximal P2 segment of the left PCA with reconstitution at the mid P2 segment. Focal moderate stenosis of the V4 segment right vertebral artery. Mild atherosclerosis at the right and left carotid bifurcations without hemodynamically significant stenosis. MRI  showed Acute infarct in the right basal ganglia. Remote left PCA territory infarcts and additional small remote infarcts in the right occipital cortex within the right PCA territory. 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 Moderate generalized parenchymal volume loss.2D Echo with EF 55-60%. Pt. Seen by PT/OT/SLP and they recommend CIR to assist return to PLOF.    Complete NIHSS TOTAL: 13   Patient's medical record from [facility]  has been reviewed by the rehabilitation admission coordinator and physician.   Past Medical History  Past Medical History:  Diagnosis Date   AMS (altered mental status) 03/15/2023   ARF (acute renal failure) (HCC) 09/30/2023   BPH (benign prostatic hyperplasia)     Cerebral infarction (HCC) 09/13/2012    IMO SNOMED Dx Update Oct 2024     Cerebrovascular disease     Clostridium difficile diarrhea     Congenital anomaly of diaphragm     Elevated PSA     Glaucoma, both eyes     Hemorrhoid     Hepatitis B surface antigen positive      02-20-2011   History of adenomatous polyp of colon      2007, 2009 and 2013  tubular adenoma's   History of alcohol  abuse      quit 1963   History of cerebral parenchymal hemorrhage      01/ 2006  left occiptial lobe related to hypertensive crisis   History of CVA (cerebrovascular accident)       09-12-2012  left hippocampus/ amygdala junction and per MRI old white matter infarcts--  per pt residual short- term memory issues   History of fatty infiltration of liver hx visit's at [facility] , last visit 05/ 2014    elvated LFT's ,  via liver bx 2004 related to hx alcohol  and drug abuse (quit 1964)   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 Yes   Family History   family history includes Diabetes in his maternal grandmother and mother; Heart attack in his father and mother; Heart disease in his maternal grandmother; Kidney failure in his mother; Rheum arthritis in his maternal grandmother and mother; Stroke in his maternal grandmother and mother.   Current Medications  Current Medications    Current Facility-Administered Medications:    acetaminophen  (TYLENOL ) tablet 650 mg, 650 mg, Oral, Q6H PRN **OR** acetaminophen  (TYLENOL ) suppository 650 mg, 650 mg, Rectal, Q6H PRN, Melvin, Alexander B, MD   aspirin  chewable tablet 81 mg, 81 mg, Oral, Daily, Melvin, Alexander B, MD   clopidogrel  (PLAVIX ) tablet 75 mg, 75 mg, Oral, Daily, Remi, Devon, NP   dutasteride  (AVODART ) capsule 0.5 mg, 0.5 mg, Oral, Daily, Melvin, Alexander B, MD   magnesium  oxide (MAG-OX) tablet 800 mg, 800 mg, Oral, Once, Kommor, Madison, MD   melatonin tablet 3 mg, 3 mg, Oral, QHS, Melvin, Alexander B, MD   memantine  (NAMENDA ) tablet 10 mg, 10 mg, Oral, BID, Melvin, Alexander B, MD   polyethylene glycol (MIRALAX  / GLYCOLAX ) packet 17 g, 17 g, Oral, Daily PRN, Melvin, Alexander B, MD   potassium chloride  SA (KLOR-CON  M) CR tablet 40  mEq, 40 mEq, Oral, Once, Kommor, Madison, MD   sodium chloride  flush (NS) 0.9 % injection 3 mL, 3 mL, Intravenous, Q12H, Melvin, Alexander B, MD, 3 mL at 12/31/23 2223     Patients Current Diet:  Diet Order                  Diet  NPO time specified  Diet effective now                         Precautions / Restrictions Precautions Precautions: Fall Restrictions Weight Bearing Restrictions Per Provider Order: No    Has the patient had 2 or more falls or a fall with injury in the past year? Yes   Prior Activity Level Community (5-7x/wk): Pt. active in the communtiy   Prior Functional Level Self Care: Did the patient need help bathing, dressing, using the toilet or eating? Needed some help   Indoor Mobility: Did the patient need assistance with walking from room to room (with or without device)? Independent   Stairs: Did the patient need assistance with internal or external stairs (with or without device)? Dependent   Functional Cognition: Did the patient need help planning regular tasks such as shopping or remembering to take medications? Dependent   Patient Information Are you of Hispanic, Latino/a,or Spanish origin?: A. No, not of Hispanic, Latino/a, or Spanish origin What is your race?: B. Black or African American Do you need or want an interpreter to communicate with a doctor or health care staff?: 0. No Patient information obtained via proxy : wife   Patient's Response To:  Health Literacy and Transportation Is the patient able to respond to health literacy and transportation needs?: No Health Literacy - How often do you need to have someone help you when you read instructions, pamphlets, or other written material from your doctor or pharmacy?: Patient unable to respond In the past 12 months, has lack of transportation kept you from medical appointments or from getting medications?: No In the past 12 months, has lack of transportation kept you from meetings, work, or  from getting things needed for daily living?: No Health Literacy and Transportation obtained via proxy: wife   Journalist, newspaper / Equipment Home Equipment: Grab bars - toilet, Grab bars - tub/shower, BSC/3in1, Hand held shower head, Cane - single point, Rollator (4 wheels)   Prior Device Use: Indicate devices/aids used by the patient prior to current illness, exacerbation or injury? Walker   Current Functional Level Cognition   Arousal/Alertness: Awake/alert Overall Cognitive Status: Impaired/Different from baseline Orientation Level: Oriented to person, Oriented to place, Disoriented to time, Disoriented to situation Attention: Sustained Sustained Attention: Impaired Sustained Attention Impairment: Functional basic, Verbal basic Problem Solving: Impaired Problem Solving Impairment: Functional basic    Extremity Assessment (includes Sensation/Coordination)   Upper Extremity Assessment: Defer to OT evaluation RUE Deficits / Details: limited mobility, fair grasp able to maintain on stedy. Elbow flexion/ext limited with shoulder movement very slight ROM ~10 deg. LUE Deficits / Details: very limited grasp with LUE, weaker than R and inconsistent. trace elbow flexion no elbow ext, strong flexor synergy pattern. Able to retract scapula, scapula tight with notable protracted position. 3/4 elbow flex tone LUE Sensation: WNL (per report) LUE Coordination: decreased fine motor, decreased gross motor  Lower Extremity Assessment: RLE deficits/detail, LLE deficits/detail RLE Deficits / Details: At least 3/5, unable to formally MMT due to cognition LLE Deficits / Details: Able to wiggle toes and contract L quad, WFL PROM     ADLs   Grooming: Maximal assistance, Sitting Grooming Details (indicate cue type and reason): max A for thoroughness, initiated with hand over hand assist and placement of cloth in hand. Able to bring washcloth to face with light AAROM using RUE. General ADL Comments:  Total A for all other ADLs.  Mobility   Overal bed mobility: Needs Assistance Bed Mobility: Supine to Sit Supine to sit: Total assist, +2 for safety/equipment, +2 for physical assistance, Used rails General bed mobility comments: cues and no initiation of bed mobility.     Transfers   Overall transfer level: Needs assistance Equipment used: 2 person hand held assist, Ambulation equipment used Transfers: Sit to/from Stand, Bed to chair/wheelchair/BSC Sit to Stand: Max assist, +2 physical assistance, +2 safety/equipment Bed to/from chair/wheelchair/BSC transfer type:: Via Lift equipment Stand pivot transfers: Max assist, +2 physical assistance, +2 safety/equipment Transfer via Lift Equipment: Stedy General transfer comment: able to stand with MaxAx2 and [facility] with difficulty maintaining upright posture. Transfer to recliner with Stedy, assist needed to bring hands to stedy handle. Able to slightly pull into standing with MaxAx2 to complete. Slight left lateral lean     Ambulation / Gait / Stairs / Wheelchair Mobility   Ambulation/Gait General Gait Details: unable this date     Posture / Balance Dynamic Sitting Balance Sitting balance - Comments: at some periods CGA, began leaning L and anteriorly with fatigue Balance Overall balance assessment: Needs assistance Sitting-balance support: Feet supported Sitting balance-Leahy Scale: Poor Sitting balance - Comments: at some periods CGA, began leaning L and anteriorly with fatigue Postural control: Left lateral lean Standing balance-Leahy Scale: Zero Standing balance comment: reliant on ext assist.     Special considerations/life events  Skin intact and Special service needs none    Previous Home Environment (from acute therapy documentation) Living Arrangements: Spouse/significant other Available Help at Discharge: Family, Available 24 hours/day Type of Home: House Home Layout: One level Home Access: Ramped entrance Bathroom  Shower/Tub: Health visitor: Standard Bathroom Accessibility: Yes Home Care Services: Yes Type of Home Care Services: Homehealth aide Additional Comments: Aide x3 hrs 4day/wk. Pt with limited communication, PLOF and setup from past notes   Discharge Living Setting Plans for Discharge Living Setting: House Type of Home at Discharge: House Discharge Home Layout: One level Discharge Home Access: Ramped entrance Discharge Bathroom Shower/Tub: Walk-in shower Discharge Bathroom Toilet: Standard Discharge Bathroom Accessibility: Yes How Accessible: Accessible via walker Does the patient have any problems obtaining your medications?: No   Social/Family/Support Systems Patient Roles: Spouse Contact Information: [phone] Anticipated Caregiver: Wife can do supervision, Neice and nephew can assist, has aide 3 hrs 4 days a week Caregiver Availability: 24/7 Discharge Plan Discussed with Primary Caregiver: Yes Is Caregiver In Agreement with Plan?: No Does Caregiver/Family have Issues with Lodging/Transportation while Pt is in Rehab?: No   Goals Patient/Family Goal for Rehab: PT/OT/SLP Min-mod A Expected length of stay: 18-21 days Pt/Family Agrees to Admission and willing to participate: Yes Program Orientation Provided & Reviewed with Pt/Caregiver Including Roles  & Responsibilities: Yes   Decrease burden of Care through IP rehab admission: Not anticipated   Possible need for SNF placement upon discharge: Not anticipated   Patient Condition: I have reviewed medical records from [facility] , spoken with CM, and patient, spouse, and family member. I met with patient at the bedside for inpatient rehabilitation assessment.  Patient will benefit from ongoing PT, OT, and SLP, can actively participate in 3 hours of therapy a day 5 days of the week, and can make measurable gains during the admission.  Patient will also benefit from the coordinated team approach  during an Inpatient Acute Rehabilitation admission.  The patient will receive intensive therapy as well as Rehabilitation physician, nursing, social worker, and care management interventions.  Due  to safety, skin/wound care, disease management, medication administration, pain management, and patient education the patient requires 24 hour a day rehabilitation nursing.  The patient is currently Max A of 2  with mobility and basic ADLs.  Discharge setting and therapy post discharge at home with home health is anticipated.  Patient has agreed to participate in the Acute Inpatient Rehabilitation Program and will admit today.   Preadmission Screen Completed By:  Leita KATHEE Kleine, 01/01/2024 1:22 PM ______________________________________________________________________   Discussed status with Dr. Carilyn  on 01/04/24 at 1036 and received approval for admission today.   Admission Coordinator:  Leita KATHEE Kleine, CCC-SLP, time 1036/Date 01/04/24    Assessment/Plan: Diagnosis: RIght basal ganglia infarcts Does the need for close, 24 hr/day Medical supervision in concert with the patient's rehab needs make it unreasonable for this patient to be served in a less intensive setting? Yes Co-Morbidities requiring supervision/potential complications: parkinsonism, severe dysphagia NPO Due to bladder management, bowel management, safety, skin/wound care, disease management, medication administration, pain management, and patient education, does the patient require 24 hr/day rehab nursing? Yes Does the patient require coordinated care of a physician, rehab nurse, PT, OT, and SLP to address physical and functional deficits in the context of the above medical diagnosis(es)? Yes Addressing deficits in the following areas: balance, endurance, locomotion, strength, transferring, bowel/bladder control, bathing, dressing, feeding, grooming, toileting, cognition, and psychosocial support Can the patient actively participate in an  intensive therapy program of at least 3 hrs of therapy 5 days a week? Yes The potential for patient to make measurable gains while on inpatient rehab is fair Anticipated functional outcomes upon discharge from inpatient rehab: mod assist PT, mod assist OT, mod assist SLP Estimated rehab length of stay to reach the above functional goals is: 21-24d Anticipated discharge destination: Home with assist vs SNF 10. Overall Rehab/Functional Prognosis: fair     MD Signature: Prentice CHARLENA Carilyn M.D. [facility] Fellow Am Acad of Phys Med and Rehab Diplomate Am Board of Electrodiagnostic Med Fellow Am Board of Interventional Pain

## 2024-01-04 NOTE — Progress Notes (Deleted)
 PROGRESS NOTE  Dustin Hamilton FMW:[id number] DOB: 08/30/1940 DOA: 12/30/2023 PCP: [facility]   LOS: 4 days   Brief Narrative / Interim history: 83 year old male with HTN, HLD, TIA, prior CVA, amyloid angiopathy, paroxysmal SVT status post ablation, CAD 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  When he woke up on the day of admission had left-sided facial droop, weakness, right gaze deviation and was unable to speak.  At baseline he is confused but walking, able to feed self and have short conversation per his wife  Subjective / 24h Interval events: No events overnight, looks about the same this morning, minimally interactive  Assesement and Plan: Principal Problem:   Focal neurological deficit Active Problems:   Essential hypertension   History of TIA (transient ischemic attack) and stroke   Benign prostatic hyperplasia with urinary obstruction   Paroxysmal SVT (supraventricular tachycardia) (HCC)   Complete heart block (HCC)   Dementia (HCC)   Glaucoma   Mixed hyperlipidemia   Obstructive sleep apnea   Secondary parkinsonism, unspecified (HCC)   Status cardiac pacemaker   Protein-calorie malnutrition, severe   Principal problem Acute stroke-MRI on admission with acute infarct in the right basal ganglia and remote left PCA territory infarcts, additional small remote infarct in the right occipital cortex within the right PCA territory.  Neurology consulted and followed patient while hospitalized.  CT angiogram with moderate stenosis of the distal M1 segment of the right MCA.  2D echo showed LVEF 55 to 60%, normal RV.  LDL is 67, A1c is 5.5.  Failed SLP, now has a core track.  If he does not improve we will need to convert to a PEG tube over the next week or 2 - stable to discharge to inpatient rehab, awaiting insurance authorization  Active problems HTN-blood pressure is stable today,  normotensive  History of TIA/prior CVAs-noted  Paroxysmal SVT-status post ablation.  No events on telemetry  CHB-status post pacemaker  Dementia, parkinsonism-continue home medications, he appears likely worse compared to baseline due to the new stroke  BPH-unable to administer dutasteride  per tube, changed to finasteride   Scheduled Meds:  aspirin   81 mg Per Tube Daily   clopidogrel   75 mg Per Tube Daily   feeding supplement (PROSource TF20)  60 mL Per Tube Daily   finasteride   5 mg Oral Daily   free water   150 mL Per Tube Q6H   magnesium  oxide  800 mg Oral Once   melatonin  3 mg Per Tube QHS   memantine   10 mg Per Tube BID   multivitamin with minerals  1 tablet Per Tube Daily   potassium chloride   40 mEq Oral Once   sodium chloride  flush  3 mL Intravenous Q12H   thiamine   100 mg Per Tube Daily   Continuous Infusions:  feeding supplement (OSMOLITE 1.5 CAL) 50 mL/hr at 01/03/24 1832    PRN Meds:.acetaminophen  **OR** acetaminophen , polyethylene glycol  Current Outpatient Medications  Medication Instructions   acetaminophen  (TYLENOL ) 650 mg, Oral, 3 times daily PRN   allopurinol  (ZYLOPRIM ) 100 mg, Oral, Daily   amLODipine  (NORVASC ) 5 mg, Daily   aspirin  81 mg, Oral, Daily   brimonidine  (ALPHAGAN ) 0.2 % ophthalmic solution 1 drop, Both Eyes, 2 times daily   colchicine  0.6 mg, Oral, Daily   dorzolamide  (TRUSOPT ) 2 % ophthalmic solution 1 drop, 2 times daily   dutasteride  (AVODART ) 0.5 mg, Every morning   galantamine  (RAZADYNE ) 8 mg, Oral, Daily,  Takes along with memantine    latanoprost  (XALATAN ) 0.005 % ophthalmic solution 1 drop, Daily at bedtime   melatonin 3 mg, Daily at bedtime   memantine  (NAMENDA ) 10 mg, 2 times daily   tamsulosin  (FLOMAX ) 0.4 mg, Oral, Daily    Diet Orders (From admission, onward)     Start     Ordered   12/30/23 1044  Diet NPO time specified  Diet effective now        12/30/23 1044            DVT prophylaxis: SCDs Start: 12/30/23  1603   Lab Results  Component Value Date   PLT 124 (L) 01/03/2024      Code Status: Full Code  Family Communication: No family at bedside  Status is: Inpatient   Level of care: Telemetry Medical  Consultants:  Neurology  Objective: Vitals:   01/03/24 1939 01/03/24 2350 01/04/24 0253 01/04/24 0739  BP: 139/86 119/83 130/73 121/82  Pulse: 80 82 67 81  Resp: 18 18 18 20   Temp: 98.7 F (37.1 C) 98.6 F (37 C) 97.6 F (36.4 C) 98.7 F (37.1 C)  TempSrc: Oral Oral Oral Oral  SpO2: 98% 97% 96% 96%  Weight:      Height:        Intake/Output Summary (Last 24 hours) at 01/04/2024 0906 Last data filed at 01/04/2024 0616 Gross per 24 hour  Intake 1550.5 ml  Output 1300 ml  Net 250.5 ml   Wt Readings from Last 3 Encounters:  12/31/23 67.6 kg  11/10/23 68.8 kg  10/28/23 68.8 kg    Examination:  Constitutional: NAD Eyes: lids and conjunctivae normal, no scleral icterus ENMT: mmm Neck: normal, supple Respiratory: clear to auscultation bilaterally, no wheezing, no crackles. Normal respiratory effort.  Cardiovascular: Regular rate and rhythm, no murmurs / rubs / gallops. No LE edema. Abdomen: soft, no distention, no tenderness. Bowel sounds positive.   Data Reviewed: I have independently reviewed following labs and imaging studies   CBC Recent Labs  Lab 12/30/23 1044 12/30/23 1050 12/31/23 0822 01/02/24 0258 01/02/24 0510 01/03/24 0329  WBC 4.7  --  8.1 9.3 7.3 6.7  HGB 11.8* 12.2* 17.3* 16.0 18.5* 15.0  HCT 35.6* 36.0* 50.8 47.6 54.5* 44.6  PLT 92*  --  146* 146* 113* 124*  MCV 96.5  --  92.9 94.3 95.1 94.1  MCH 32.0  --  31.6 31.7 32.3 31.6  MCHC 33.1  --  34.1 33.6 33.9 33.6  RDW 13.9  --  13.5 13.5 13.6 13.6  LYMPHSABS 0.8  --   --   --   --   --   MONOABS 0.3  --   --   --   --   --   EOSABS 0.1  --   --   --   --   --   BASOSABS 0.0  --   --   --   --   --     Recent Labs  Lab 12/30/23 1044 12/30/23 1050 12/31/23 0351 12/31/23 0822  01/02/24 0258 01/02/24 0510 01/02/24 1429 01/03/24 0329  NA 144 150*  --  142 144 144  --  143  K 3.3* 2.7*  --  3.6 3.6 3.7  --  3.7  CL 117* 117*  --  108 111 109  --  111  CO2 23  --   --  23 24 25   --  23  GLUCOSE 86 71  --  100* 156* 137*  --  144*  BUN 16 13  --  14 23 24*  --  27*  CREATININE 0.78 0.60*  --  1.03 1.24 1.36*  --  0.99  CALCIUM  8.0*  --   --  10.1 9.9 10.1  --  9.6  AST 17  --   --  18 33 34  --  30  ALT 14  --   --  17 27 31   --  35  ALKPHOS 50  --   --  76 61 63  --  55  BILITOT 0.9  --   --  1.4* 1.5* 1.4*  --  0.8  ALBUMIN 2.9*  --   --  3.8 3.4* 3.5  --  3.0*  MG 1.6*  --   --   --  2.1  --   --  2.0  LATICACIDVEN  --   --   --   --   --  2.5* 2.5*  --   INR 1.2  --   --   --   --   --   --   --   HGBA1C  --   --  5.2  --   --   --   --   --     ------------------------------------------------------------------------------------------------------------------ No results for input(s): CHOL, HDL, LDLCALC, TRIG, CHOLHDL, LDLDIRECT in the last 72 hours.   Lab Results  Component Value Date   HGBA1C 5.2 12/31/2023   ------------------------------------------------------------------------------------------------------------------ No results for input(s): TSH, T4TOTAL, T3FREE, THYROIDAB in the last 72 hours.  Invalid input(s): FREET3  Cardiac Enzymes No results for input(s): CKMB, TROPONINI, MYOGLOBIN in the last 168 hours.  Invalid input(s): CK ------------------------------------------------------------------------------------------------------------------    Component Value Date/Time   BNP 106.2 (H) 03/20/2023 0312    CBG: Recent Labs  Lab 01/03/24 1603 01/03/24 1938 01/03/24 2351 01/04/24 0253 01/04/24 0740  GLUCAP 140* 146* 156* 143* 176*    Recent Results (from the past 240 hours)  SARS Coronavirus 2 by RT PCR (hospital order, performed in [facility] lab) *cepheid single result test*     Status:  None   Collection Time: 12/30/23 11:00 PM  Result Value Ref Range Status   SARS Coronavirus 2 by RT PCR NEGATIVE NEGATIVE Final    Comment: Performed at [facility], [address], [place], [place] [address]     Radiology Studies: No results found.     Nilda Fendt, MD, PhD [facility]  Between 7 am - 7 pm I am available, please contact me via Amion (for emergencies) or Securechat (non urgent messages)  Between 7 pm - 7 am I am not available, please contact night coverage MD/APP via Amion

## 2024-01-04 NOTE — Progress Notes (Addendum)
 Initial Nutrition Assessment  DOCUMENTATION CODES:   Severe malnutrition in context of chronic illness  INTERVENTION:   Continue tube feeding via Cortrak: Osmolite 1.5 at 60 ml/h (may hold for up to 4 hours per day for therapies) Prosource TF20 60 ml once daily. Free water  flushes 150 ml q6h. Provides 1880 kcal, 95 gm protein, 1514 ml free water  daily.  NUTRITION DIAGNOSIS:   Severe Malnutrition related to chronic illness as evidenced by severe muscle depletion, severe fat depletion, percent weight loss (12% weight loss within 3 months).  GOAL:   Patient will meet greater than or equal to 90% of their needs  MONITOR:   TF tolerance, Diet advancement  REASON FOR ASSESSMENT:   Consult    ASSESSMENT:   83 yo male admitted with functional decline r/t stroke. PMH includes HTN, CVD, BPH, CVA, former alcohol  and mixed drug abuse, glaucoma, dementia, Parkinsonism.  Patient failed bedside swallow evaluations 7/31 and 8/1 and required Cortrak placement on 8/1 during acute hospitalization. He has been receiving Osmolite 1.5 at 50 ml/h with Prosource TF20 60 ml daily. Will adjust TF rate to account for 4 hours off for therapies while in CIR.    Labs reviewed.  CBG: [id number]  Medications reviewed and include MVI with minerals, thiamine .  Weight history reviewed.  Patient with 12% weight loss over the past 3 months, which is severe for the time frame. Patient meets criteria for severe malnutrition, given severe depletion of muscle and subcutaneous fat mass with severe weight loss.  NUTRITION - FOCUSED PHYSICAL EXAM:  Flowsheet Row Most Recent Value  Orbital Region Severe depletion  Upper Arm Region Moderate depletion  Thoracic and Lumbar Region Moderate depletion  Buccal Region Severe depletion  Temple Region Severe depletion  Clavicle Bone Region Severe depletion  Clavicle and Acromion Bone Region Severe depletion  Scapular Bone Region Severe depletion  Dorsal Hand  Moderate depletion  Patellar Region Severe depletion  Anterior Thigh Region Severe depletion  Posterior Calf Region Severe depletion  Edema (RD Assessment) None  Hair Reviewed  Eyes Reviewed  Mouth Reviewed  Skin Reviewed  Nails Reviewed    Diet Order:   Diet Order             Diet NPO time specified  Diet effective now                   EDUCATION NEEDS:   No education needs have been identified at this time  Skin:  Skin Assessment: Reviewed RN Assessment  Last BM:  no BM documented  Height:   Ht Readings from Last 1 Encounters:  01/04/24 5' 11 (1.803 m)    Weight:   Wt Readings from Last 1 Encounters:  01/04/24 63 kg    Ideal Body Weight:  78.2 kg  BMI:  Body mass index is 19.37 kg/m.  Estimated Nutritional Needs:   Kcal:  1750-1950  Protein:  80-100 gm  Fluid:  1.75-1.95 L   Suzen HUNT RD, LDN, CNSC Contact via secure chat. If unavailable, use group chat RD Inpatient.

## 2024-01-04 NOTE — Progress Notes (Signed)
 Patient ID: Dustin Hamilton, male   DOB: 16-Feb-1941, 83 y.o.   MRN: [id number]  INPATIENT REHABILITATION ADMISSION NOTE   Arrival Method: Bed     Mental Orientation: x 2-3   Assessment: see flowsheet   Skin: CDI   IV'S: 20 G Right forearm   Pain: none reported   Tubes and Drains: Cortrak left nare   Safety Measures: in place   Vital Signs: see flowsheet   Height and Weight: see flowsheet   Rehab Orientation: completed with family   Family: at bedside   Notes: safety mitten on to prevent pulling of cortrak

## 2024-01-04 NOTE — TOC Transition Note (Signed)
 Transition of Care [facility]) - Discharge Note   Patient Details  Name: BALLARD BUDNEY MRN: [id number] Date of Birth: 08/29/1940  Transition of [facility]) CM/SW Contact:  Andrez JULIANNA George, RN Phone Number: 01/04/2024, 12:08 PM   Clinical Narrative:     Pt is discharging to CIR today. No further needs per IP Care Management.  Final next level of care: IP Rehab Facility Barriers to Discharge: No Barriers Identified   Patient Goals and CMS Choice   CMS Medicare.gov Compare Post Acute Care list provided to:: Patient Choice offered to / list presented to : Spouse      Discharge Placement                       Discharge Plan and Services Additional resources added to the After Visit Summary for     Discharge Planning Services: CM Consult Post Acute Care Choice: IP Rehab                               Social Drivers of Health (SDOH) Interventions SDOH Screenings   Food Insecurity: Patient Unable To Answer (12/31/2023)  Housing: Patient Unable To Answer (12/31/2023)  Transportation Needs: Patient Unable To Answer (12/31/2023)  Utilities: Patient Unable To Answer (12/31/2023)  Social Connections: Patient Unable To Answer (12/31/2023)  Tobacco Use: Medium Risk (12/31/2023)     Readmission Risk Interventions     No data to display

## 2024-01-04 NOTE — Plan of Care (Signed)
  Problem: Education: Goal: Knowledge of disease or condition will improve Outcome: Not Progressing Goal: Knowledge of secondary prevention will improve (MUST DOCUMENT ALL) Outcome: Not Progressing Goal: Knowledge of patient specific risk factors will improve (DELETE if not current risk factor) Outcome: Not Progressing   Problem: Ischemic Stroke/TIA Tissue Perfusion: Goal: Complications of ischemic stroke/TIA will be minimized Outcome: Progressing   Problem: Self-Care: Goal: Ability to participate in self-care as condition permits will improve Outcome: Not Progressing   Problem: Education: Goal: Knowledge of General Education information will improve Description: Including pain rating scale, medication(s)/side effects and non-pharmacologic comfort measures Outcome: Not Progressing   Problem: Health Behavior/Discharge Planning: Goal: Ability to manage health-related needs will improve Outcome: Not Progressing   Problem: Clinical Measurements: Goal: Will remain free from infection Outcome: Progressing Goal: Diagnostic test results will improve Outcome: Progressing Goal: Respiratory complications will improve Outcome: Progressing Goal: Cardiovascular complication will be avoided Outcome: Progressing   Problem: Activity: Goal: Risk for activity intolerance will decrease Outcome: Not Progressing   Problem: Coping: Goal: Level of anxiety will decrease Outcome: Not Progressing   Problem: Safety: Goal: Ability to remain free from injury will improve Outcome: Progressing   Problem: Skin Integrity: Goal: Risk for impaired skin integrity will decrease Outcome: Progressing

## 2024-01-04 NOTE — H&P (Incomplete)
 Physical Medicine and Rehabilitation Admission H&P    Chief Complaint  Patient presents with   Stroke with functional decline.     HPI: Dustin CROCKET is an 83 year old male with history of CVA, ICH, SVT, CHB s/p PPM,  fatty liver, Parkinsonism, dementia w/sundowning, cerebral angiopathy w/multiple amyloid spells (transient focal neurologic deficits), orthostatic hypotension w/syncope;  who was presented to ED on 12/30/23 with decrease in LOC, left sided weakness, right gaze deviation and left facial droop. CTA head/neck showed new focal moderate stenosis of distal M1 segment of R-MCA, old P2 segment occlusion of L-PCA and focal moderate stenosis of V4 segment of R-VA.  MRI brain done reveling acute infarct in Right basal ganglia with remote L-PCA territory infarcts, numerous chronic microhemorrhages c/w hx of cerebral amyloid angiopathy and moderate generalized parenchymal volume loss.  2 D echo showed EF 55-65% with moderate calcification of R-AV and negative for shunt but study limited due to body habitus.   Dr. Rosemarie recommends DAPT X 3 weeks followed by Plavix  alone. Hospital course significant for pseudobulbar affect with non verbal state, increased tone in all extremities with drift to the left, ability to intermittently follow simple commands, tends to keep eyes closed and opens mouth reflexively but with poor oral awareness and significant oral holding. He was made NPO and started on tube feeds for nutritional support.  He did have decrease in responsiveness with hypotension on early am of 08/02 and improved with  fluid bolus X 1 liter. Noted elevated lactate and to be dehydrated. CXR with mild Right base atelectasis.  CT head showed expected evolution without hemorrhagic transformation.   PT/OT/ST has been working with patient who requires cues to keep eyes open, is limited by cognitive deficits with poor initiation, requires  max to total assist for bed mobility, +2 mod assist to  sit at EOB with left lateral lean. for bed mobility and total assist with ADLs. Prior to admission, he was able to ambulate with rollater in home setting and  needed assistance with ADLs/IADLs. He has a Comptroller 4 hours/7 days a week. CIR recommended due to functional decline.     Review of Systems  Unable to perform ROS: Mental acuity     Past Medical History:  Diagnosis Date   AMS (altered mental status) 03/15/2023   ARF (acute renal failure) (HCC) 09/30/2023   BPH (benign prostatic hyperplasia)    Cerebral infarction (HCC) 09/13/2012   IMO SNOMED Dx Update Oct 2024     Cerebrovascular disease    Clostridium difficile diarrhea    Congenital anomaly of diaphragm    Elevated PSA    Glaucoma, both eyes    Hemorrhoid    Hepatitis B surface antigen positive    02-20-2011   History of adenomatous polyp of colon    2007, 2009 and 2013  tubular adenoma's   History of alcohol  abuse    quit 1963   History of cerebral parenchymal hemorrhage    01/ 2006  left occiptial lobe related to hypertensive crisis   History of CVA (cerebrovascular accident)    09-12-2012  left hippocampus/ amygdala junction and per MRI old white matter infarcts--  per pt residual short- term memory issues   History of fatty infiltration of liver hx visit's at [facility] , last visit 05/ 2014   elvated LFT's ,  via liver bx 2004 related to hx alcohol  and drug abuse (quit 1964)   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   HTN (hypertension)    Renal cyst, left    Sepsis (HCC) 06/26/2021   Stroke (HCC)    hx of 3 strokes in past    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    Urethral lesion    urethral mass   UTI (urinary tract infection) 03/15/2023    Past Surgical History:  Procedure Laterality Date   CARDIOVASCULAR STRESS TEST  05/05/2007   normal nuclear study w/ no ischemia/  normal LV fucntion and wall motion , ef60%   COLONOSCOPY  last one  04-06-2012   CYSTO/  LEFT RETROGRADE PYELOGRAM/ CYTOLOGY WASHINGS/  URETEROSCOPY  03/05/2000   INGUINAL HERNIA REPAIR Bilateral 1965 and 1980's   IR 3D INDEPENDENT WKST  07/31/2022   IR ANGIOGRAM PELVIS SELECTIVE OR SUPRASELECTIVE  07/31/2022   IR ANGIOGRAM SELECTIVE EACH ADDITIONAL VESSEL  07/31/2022   IR ANGIOGRAM SELECTIVE EACH ADDITIONAL VESSEL  07/31/2022   IR ANGIOGRAM SELECTIVE EACH ADDITIONAL VESSEL  07/31/2022   IR ANGIOGRAM SELECTIVE EACH ADDITIONAL VESSEL  07/31/2022   IR EMBO TUMOR ORGAN ISCHEMIA INFARCT INC GUIDE ROADMAPPING  07/31/2022   IR RADIOLOGIST EVAL & MGMT  06/26/2022   IR RADIOLOGIST EVAL & MGMT  08/08/2022   IR RADIOLOGIST EVAL & MGMT  08/21/2022   IR RADIOLOGIST EVAL & MGMT  03/25/2023   IR US  GUIDE VASC ACCESS RIGHT  07/31/2022   LAPAROSCOPIC INGUINAL HERNIA WITH UMBILICAL HERNIA Right 06/24/2007   LIVER BIOPSY  1980's and 2004   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   Procedure: PACEMAKER IMPLANT;  Surgeon: Waddell Danelle ORN, MD;  Location: [facility];  Service: Cardiovascular;  Laterality: N/A;   SVT ABLATION N/A 11/15/2018   Procedure: SVT ABLATION;  Surgeon: Waddell Danelle ORN, MD;  Location: [facility];  Service: Cardiovascular;  Laterality: N/A;   TRANSTHORACIC ECHOCARDIOGRAM  09/13/2012   moderate LVH,  ef 60-65%/     TRANSURETHRAL RESECTION OF BLADDER TUMOR N/A 08/11/2016   Procedure: TRANSURETHRAL RESECTION OF BLADDER TUMOR (TURBT);  Surgeon: Belvie LITTIE Clara, MD;  Location: [facility];  Service: Urology;  Laterality: N/A;    Family History  Problem Relation Age of Onset   Rheum arthritis Mother    Diabetes Mother    Stroke Mother    Heart attack Mother    Kidney failure Mother    Heart attack Father    Heart disease Maternal Grandmother    Rheum arthritis Maternal Grandmother    Diabetes Maternal Grandmother    Stroke Maternal Grandmother    Colon cancer Neg Hx     Social History:  Married. 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  reports that he quit smoking about 42 years ago. His smoking use included cigarettes. He started smoking about 47 years ago. He has a 5 pack-year smoking history. He has never used smokeless tobacco. He reports that he does not drink alcohol  and does not use drugs.   Allergies  Allergen Reactions   Penicillins Hives   Levetiracetam  Other (See Comments)    Altered mental status   Aricept  [Donepezil ] Other (See Comments)    Dizziness    Exelon [Rivastigmine] Other (See Comments)    Malaise    Viagra [Sildenafil] Palpitations and Other (See Comments)    Dizziness, also     Medications Prior to Admission  Medication Sig Dispense Refill   acetaminophen  (TYLENOL ) 325 MG tablet Take 650  mg by mouth 3 (three) times daily as needed for moderate pain (pain score 4-6).     allopurinol  (ZYLOPRIM ) 100 MG tablet Take 1 tablet (100 mg total) by mouth daily. 30 tablet 2   amLODipine  (NORVASC ) 5 MG tablet Take 5 mg by mouth daily.     aspirin  81 MG chewable tablet Chew 1 tablet (81 mg total) by mouth daily.     brimonidine  (ALPHAGAN ) 0.2 % ophthalmic solution Place 1 drop into both eyes 2 (two) times daily. 5 mL 12   colchicine  0.6 MG tablet Take 1 tablet (0.6 mg total) by mouth daily. (Patient taking differently: Take 0.6 mg by mouth daily as needed.) 30 tablet 0   dorzolamide  (TRUSOPT ) 2 % ophthalmic solution Place 1 drop into both eyes 2 (two) times daily.     dutasteride  (AVODART ) 0.5 MG capsule Take 0.5 mg by mouth in the morning.     galantamine  (RAZADYNE ) 8 MG tablet Take 8 mg by mouth daily. Takes along with memantine      latanoprost  (XALATAN ) 0.005 % ophthalmic solution Place 1 drop into both eyes at bedtime.     melatonin 3 MG TABS tablet Take 3 mg by mouth at bedtime.     memantine  (NAMENDA ) 10 MG tablet Take 10 mg by mouth 2 (two) times daily.     tamsulosin  (FLOMAX ) 0.4 MG CAPS capsule Take 0.4 mg by mouth daily.       Home: Home  Living Family/patient expects to be discharged to:: Private residence Living Arrangements: Spouse/significant other Available Help at Discharge: Family, Available 24 hours/day Type of Home: House Home Access: Ramped entrance Home Layout: One level Bathroom Shower/Tub: Health visitor: Standard Bathroom Accessibility: Yes Home Equipment: Grab bars - toilet, Grab bars - tub/shower, BSC/3in1, Hand held shower head, Cane - single point, Rollator (4 wheels) Additional Comments: Aide x3 hrs 4day/wk. Pt with limited communication, PLOF and setup from past notes   Functional History: Prior Function Prior Level of Function : Needs assist Mobility Comments: uses rollator for mobility ADLs Comments: assist with bathing tasks, wears briefs at baseline. pt reports wife will assist with dressing as needed. Aide/family assist with IADLs  Functional Status:  Mobility: Bed Mobility Overal bed mobility: Needs Assistance Bed Mobility: Supine to Sit, Sit to Supine Supine to sit: Max assist Sit to supine: Total assist General bed mobility comments: able to assist minimally with bringing R LE towards EOB, MaxA for remainder of transfer. Initial left lateral lean requiring ModA to support, total A to return to supine Transfers Overall transfer level: Needs assistance Equipment used: 2 person hand held assist Transfers: Bed to chair/wheelchair/BSC Sit to Stand: Max assist, +2 physical assistance, +2 safety/equipment Bed to/from chair/wheelchair/BSC transfer type:: Squat pivot Stand pivot transfers: Max assist, +2 physical assistance, +2 safety/equipment Squat pivot transfers: Max assist, +2 physical assistance, +2 safety/equipment Transfer via Lift Equipment: Stedy General transfer comment: NT this session as +2 assist not available Ambulation/Gait General Gait Details: unable this date    ADL: ADL Grooming: Maximal assistance, Sitting Grooming Details (indicate cue type and  reason): max A for thoroughness, initiated with hand over hand assist and placement of cloth in hand. Able to bring washcloth to face with light AAROM using RUE. General ADL Comments: Total A for all other ADLs.  Cognition: Cognition Overall Cognitive Status: Impaired/Different from baseline Arousal/Alertness: Awake/alert Orientation Level: Oriented to person, Disoriented to place, Disoriented to time, Disoriented to situation Attention: Sustained Sustained Attention: Impaired Sustained Attention Impairment: Functional  basic, Verbal basic Problem Solving: Impaired Problem Solving Impairment: Functional basic Cognition Arousal: Lethargic Behavior During Therapy: Flat affect Overall Cognitive Status: Impaired/Different from baseline   Blood pressure 121/82, pulse 81, temperature 98.7 F (37.1 C), temperature source Oral, resp. rate 20, height 5' 11 (1.803 m), weight 67.6 kg, SpO2 96%. Physical Exam Constitutional:      Appearance: He is underweight.     Comments: Frail appearing male with cortak in nares and mitten on right hand.  Keeps eyes closed but able to open right with cues.   Neurological:     Mental Status: He is alert.     Comments: Left facial weakness with ptosis. Able to open eyes with max cues to activate. Moderate to severe dysarthria with oral secretions and drooling after few one word answers. He did not make eye contact and was unable to follow simple one step motor commands without tactile cues. Able to raise RUE and RLE. Tends to lean to the left with extensor tone. He was able to state his name and partially state DOB at [birth year] but not year or age.       Results for orders placed or performed during the hospital encounter of 12/30/23 (from the past 48 hours)  Glucose, capillary     Status: Abnormal   Collection Time: 01/02/24 12:07 PM  Result Value Ref Range   Glucose-Capillary 131 (H) 70 - 99 mg/dL    Comment: Glucose reference range applies only to samples  taken after fasting for at least 8 hours.   Comment 1 Notify RN   Lactic acid, plasma     Status: Abnormal   Collection Time: 01/02/24  2:29 PM  Result Value Ref Range   Lactic Acid, Venous 2.5 (HH) 0.5 - 1.9 mmol/L    Comment: CRITICAL VALUE NOTED. VALUE IS CONSISTENT WITH PREVIOUSLY REPORTED/CALLED VALUE Performed at [facility], [address], [place], [place] [address]   Troponin I (High Sensitivity)     Status: None   Collection Time: 01/02/24  2:29 PM  Result Value Ref Range   Troponin I (High Sensitivity) 17 <18 ng/L    Comment: (NOTE) Elevated high sensitivity troponin I (hsTnI) values and significant  changes across serial measurements may suggest ACS but many other  chronic and acute conditions are known to elevate hsTnI results.  Refer to the Links section for chest pain algorithms and additional  guidance. Performed at [facility], [address] [place], [place] [address]   Glucose, capillary     Status: None   Collection Time: 01/02/24  3:54 PM  Result Value Ref Range   Glucose-Capillary 99 70 - 99 mg/dL    Comment: Glucose reference range applies only to samples taken after fasting for at least 8 hours.   Comment 1 Notify RN   Glucose, capillary     Status: Abnormal   Collection Time: 01/02/24  8:14 PM  Result Value Ref Range   Glucose-Capillary 153 (H) 70 - 99 mg/dL    Comment: Glucose reference range applies only to samples taken after fasting for at least 8 hours.  Glucose, capillary     Status: Abnormal   Collection Time: 01/03/24 12:08 AM  Result Value Ref Range   Glucose-Capillary 153 (H) 70 - 99 mg/dL    Comment: Glucose reference range applies only to samples taken after fasting for at least 8 hours.  Comprehensive metabolic panel with GFR     Status: Abnormal   Collection Time:  01/03/24  3:29 AM  Result Value Ref Range   Sodium 143 135 - 145 mmol/L   Potassium 3.7 3.5 - 5.1 mmol/L   Chloride 111 98 - 111 mmol/L   CO2 23 22 - 32  mmol/L   Glucose, Bld 144 (H) 70 - 99 mg/dL    Comment: Glucose reference range applies only to samples taken after fasting for at least 8 hours.   BUN 27 (H) 8 - 23 mg/dL   Creatinine, Ser 9.00 0.61 - 1.24 mg/dL   Calcium  9.6 8.9 - 10.3 mg/dL   Total Protein 6.2 (L) 6.5 - 8.1 g/dL   Albumin 3.0 (L) 3.5 - 5.0 g/dL   AST 30 15 - 41 U/L   ALT 35 0 - 44 U/L   Alkaline Phosphatase 55 38 - 126 U/L   Total Bilirubin 0.8 0.0 - 1.2 mg/dL   GFR, Estimated >39 >39 mL/min    Comment: (NOTE) Calculated using the CKD-EPI Creatinine Equation (2021)    Anion gap 9 5 - 15    Comment: Performed at [facility], [address], [place], [place] [address]  CBC     Status: Abnormal   Collection Time: 01/03/24  3:29 AM  Result Value Ref Range   WBC 6.7 4.0 - 10.5 K/uL   RBC 4.74 4.22 - 5.81 MIL/uL   Hemoglobin 15.0 13.0 - 17.0 g/dL   HCT 55.3 60.9 - 47.9 %   MCV 94.1 80.0 - 100.0 fL   MCH 31.6 26.0 - 34.0 pg   MCHC 33.6 30.0 - 36.0 g/dL   RDW 86.3 88.4 - 84.4 %   Platelets 124 (L) 150 - 400 K/uL   nRBC 0.0 0.0 - 0.2 %    Comment: Performed at [facility], [address], [place], [place] [address]  Magnesium      Status: None   Collection Time: 01/03/24  3:29 AM  Result Value Ref Range   Magnesium  2.0 1.7 - 2.4 mg/dL    Comment: Performed at [facility], [address], [place], [place] [address]  Glucose, capillary     Status: Abnormal   Collection Time: 01/03/24  3:44 AM  Result Value Ref Range   Glucose-Capillary 144 (H) 70 - 99 mg/dL    Comment: Glucose reference range applies only to samples taken after fasting for at least 8 hours.  Glucose, capillary     Status: Abnormal   Collection Time: 01/03/24  7:49 AM  Result Value Ref Range   Glucose-Capillary 160 (H) 70 - 99 mg/dL    Comment: Glucose reference range applies only to samples taken after fasting for at least 8 hours.   Comment 1 Notify RN    Comment 2 Document in Chart   Glucose, capillary     Status:  Abnormal   Collection Time: 01/03/24 12:40 PM  Result Value Ref Range   Glucose-Capillary 154 (H) 70 - 99 mg/dL    Comment: Glucose reference range applies only to samples taken after fasting for at least 8 hours.   Comment 1 Notify RN    Comment 2 Document in Chart   Glucose, capillary     Status: Abnormal   Collection Time: 01/03/24  4:03 PM  Result Value Ref Range   Glucose-Capillary 140 (H) 70 - 99 mg/dL    Comment: Glucose reference range applies only to samples taken after fasting for at least 8 hours.   Comment 1 Notify RN    Comment  2 Document in Chart   Glucose, capillary     Status: Abnormal   Collection Time: 01/03/24  7:38 PM  Result Value Ref Range   Glucose-Capillary 146 (H) 70 - 99 mg/dL    Comment: Glucose reference range applies only to samples taken after fasting for at least 8 hours.   Comment 1 Notify RN    Comment 2 Document in Chart   Glucose, capillary     Status: Abnormal   Collection Time: 01/03/24 11:51 PM  Result Value Ref Range   Glucose-Capillary 156 (H) 70 - 99 mg/dL    Comment: Glucose reference range applies only to samples taken after fasting for at least 8 hours.   Comment 1 Notify RN    Comment 2 Document in Chart   Glucose, capillary     Status: Abnormal   Collection Time: 01/04/24  2:53 AM  Result Value Ref Range   Glucose-Capillary 143 (H) 70 - 99 mg/dL    Comment: Glucose reference range applies only to samples taken after fasting for at least 8 hours.   Comment 1 Notify RN    Comment 2 Document in Chart   Glucose, capillary     Status: Abnormal   Collection Time: 01/04/24  7:40 AM  Result Value Ref Range   Glucose-Capillary 176 (H) 70 - 99 mg/dL    Comment: Glucose reference range applies only to samples taken after fasting for at least 8 hours.   Comment 1 Notify RN    Comment 2 Document in Chart    No results found.    Blood pressure 121/82, pulse 81, temperature 98.7 F (37.1 C), temperature source Oral, resp. rate 20, height  5' 11 (1.803 m), weight 67.6 kg, SpO2 96%.  Medical Problem List and Plan: 1. Functional deficits secondary to ***  -patient may *** shower  -ELOS/Goals: *** 2.  Antithrombotics: -DVT/anticoagulation:  Pharmaceutical: Lovenox   -antiplatelet therapy: ASA/Plavix  X 3 weeks followed by Plavix  alone 3. Pain Management: Tylenol  prn.  4. Mood/Behavior/Sleep: LCSW to follow for evaluation and support.  --hx of sundowning-->continue melatonin 3 mg/Monitor sleep hygeine  -antipsychotic agents: N/A 5. Neuropsych/cognition: This patient is not capable of making decisions on fully own behalf. 6. Skin/Wound Care:  Routine pressure relief measures.  7. Fluids/Electrolytes/Nutrition: Continue NPO due to holding of orals.   --on tube feeds.  8. HTN: Monitor BP TID--goal 150 to avoid symptoms (multiple ED visits this year)  --hx of chronic dizziness. Needs to stay hydrated. 9. H/o SVT/CHF s/p PPM/syncope: Monitor for orthostatic symptoms  10. BPH/urinary retention: Urecholine  and Proscar  added on 08/04 (cannot use Avodart  capsule due to TF).  11. Dementia: On Namenda ,  12. Glaucoma: Resume home meds. 13. Dysphagia: NPO with tube feeds. May need PEG for nutritional support.  14. Acute renal failure: Improved rom 24/1.36 to 27/0.99 13. Acute on chronic Thrombocytopenia?: Platelets 92 @ 7/30 to 124 @ 08/04 --has ranged from 120-150 with past year      ***  Sharlet GORMAN Schmitz, PA-C 01/04/2024

## 2024-01-04 NOTE — Progress Notes (Signed)
 Inpatient Rehab Admissions Coordinator:    I have a CIR bed for this Pt. RN may call report to [phone].   Pt. To admit to CIR for 18-21 days with the goal of reaching min A goals and returning home with assist from wife, [place] caregivers and other family.   Leita Kleine, MS, CCC-SLP Rehab Admissions Coordinator  [phone] (celll) [phone] (office)

## 2024-01-05 DIAGNOSIS — R1312 Dysphagia, oropharyngeal phase: Secondary | ICD-10-CM | POA: Diagnosis not present

## 2024-01-05 DIAGNOSIS — I6381 Other cerebral infarction due to occlusion or stenosis of small artery: Secondary | ICD-10-CM | POA: Diagnosis not present

## 2024-01-05 DIAGNOSIS — F01B0 Vascular dementia, moderate, without behavioral disturbance, psychotic disturbance, mood disturbance, and anxiety: Secondary | ICD-10-CM | POA: Diagnosis not present

## 2024-01-05 LAB — CBC WITH DIFFERENTIAL/PLATELET
Abs Granulocyte: 3.4 K/uL (ref 1.5–6.5)
Abs Immature Granulocytes: 0.01 K/uL (ref 0.00–0.07)
Basophils Absolute: 0 K/uL (ref 0.0–0.1)
Basophils Relative: 0 %
Eosinophils Absolute: 0.2 K/uL (ref 0.0–0.5)
Eosinophils Relative: 4 %
HCT: 44.6 % (ref 39.0–52.0)
Hemoglobin: 15.1 g/dL (ref 13.0–17.0)
Immature Granulocytes: 0 %
Lymphocytes Relative: 17 %
Lymphs Abs: 0.8 K/uL (ref 0.7–4.0)
MCH: 32.2 pg (ref 26.0–34.0)
MCHC: 33.9 g/dL (ref 30.0–36.0)
MCV: 95.1 fL (ref 80.0–100.0)
Monocytes Absolute: 0.3 K/uL (ref 0.1–1.0)
Monocytes Relative: 6 %
Neutro Abs: 3.4 K/uL (ref 1.7–7.7)
Neutrophils Relative %: 73 %
Platelets: 113 K/uL — ABNORMAL LOW (ref 150–400)
RBC: 4.69 MIL/uL (ref 4.22–5.81)
RDW: 13.8 % (ref 11.5–15.5)
WBC: 4.6 K/uL (ref 4.0–10.5)
nRBC: 0 % (ref 0.0–0.2)

## 2024-01-05 LAB — COMPREHENSIVE METABOLIC PANEL WITH GFR
ALT: 79 U/L — ABNORMAL HIGH (ref 0–44)
AST: 78 U/L — ABNORMAL HIGH (ref 15–41)
Albumin: 2.9 g/dL — ABNORMAL LOW (ref 3.5–5.0)
Alkaline Phosphatase: 57 U/L (ref 38–126)
Anion gap: 6 (ref 5–15)
BUN: 29 mg/dL — ABNORMAL HIGH (ref 8–23)
CO2: 28 mmol/L (ref 22–32)
Calcium: 9.8 mg/dL (ref 8.9–10.3)
Chloride: 110 mmol/L (ref 98–111)
Creatinine, Ser: 0.89 mg/dL (ref 0.61–1.24)
GFR, Estimated: >60 mL/min (ref >60)
Glucose, Bld: 162 mg/dL — ABNORMAL HIGH (ref 70–99)
Potassium: 3.9 mmol/L (ref 3.5–5.1)
Sodium: 144 mmol/L (ref 135–145)
Total Bilirubin: 0.4 mg/dL (ref 0.0–1.2)
Total Protein: 6.5 g/dL (ref 6.5–8.1)

## 2024-01-05 LAB — GLUCOSE, CAPILLARY
Glucose-Capillary: 133 mg/dL — ABNORMAL HIGH (ref 70–99)
Glucose-Capillary: 133 mg/dL — ABNORMAL HIGH (ref 70–99)
Glucose-Capillary: 139 mg/dL — ABNORMAL HIGH (ref 70–99)
Glucose-Capillary: 143 mg/dL — ABNORMAL HIGH (ref 70–99)
Glucose-Capillary: 162 mg/dL — ABNORMAL HIGH (ref 70–99)
Glucose-Capillary: 168 mg/dL — ABNORMAL HIGH (ref 70–99)
Glucose-Capillary: 96 mg/dL (ref 70–99)

## 2024-01-05 LAB — PHOSPHORUS: Phosphorus: 3.3 mg/dL (ref 2.5–4.6)

## 2024-01-05 LAB — MAGNESIUM: Magnesium: 2.1 mg/dL (ref 1.7–2.4)

## 2024-01-05 MED ORDER — GUAIFENESIN-DM 100-10 MG/5ML PO SYRP: 5.0000 mL | ORAL_SOLUTION | Freq: Four times a day (QID) | ORAL | Status: DC | PRN

## 2024-01-05 MED ORDER — DIPHENHYDRAMINE HCL 12.5 MG/5ML PO ELIX
25.0000 mg | ORAL_SOLUTION | Freq: Four times a day (QID) | ORAL | Status: DC | PRN
  Filled 2024-01-05: qty 10

## 2024-01-05 MED ORDER — ALUM & MAG HYDROXIDE-SIMETH 200-200-20 MG/5ML PO SUSP: 30.0000 mL | ORAL | Status: DC | PRN

## 2024-01-05 MED ORDER — ALLOPURINOL 100 MG PO TABS
100.0000 mg | ORAL_TABLET | Freq: Every day | ORAL | Status: DC
  Administered 2024-01-06 – 2024-01-29 (×27): 100 mg
  Filled 2024-01-05 (×24): qty 1

## 2024-01-05 MED ORDER — AMANTADINE HCL 50 MG/5ML PO SOLN
100.0000 mg | Freq: Two times a day (BID) | ORAL | Status: DC
  Filled 2024-01-05 (×2): qty 10

## 2024-01-05 MED ORDER — AMANTADINE HCL 50 MG/5ML PO SOLN
100.0000 mg | Freq: Two times a day (BID) | ORAL | Status: AC
  Administered 2024-01-05 – 2024-01-08 (×8): 100 mg
  Filled 2024-01-05 (×9): qty 10

## 2024-01-05 MED ORDER — GALANTAMINE HYDROBROMIDE 4 MG PO TABS
4.0000 mg | ORAL_TABLET | Freq: Every day | ORAL | Status: DC
  Administered 2024-01-06 – 2024-01-18 (×16): 4 mg via ORAL
  Filled 2024-01-05 (×14): qty 1

## 2024-01-05 NOTE — Plan of Care (Signed)
  Problem: RH Swallowing Goal: LTG Patient will participate in dysphagia therapy to increase swallow function with assistance (SLP) Description: LTG:  Patient will participate in dysphagia therapy to increase swallow function with assistance (SLP) Flowsheets (Taken 01/05/2024 1226) LTG: Pt will participate in dysphagia therapy to increase swallow function with assistance of (SLP): Moderate Assistance - Patient 50 - 74%   Problem: RH Comprehension Communication Goal: LTG Patient will comprehend basic/complex auditory (SLP) Description: LTG: Patient will comprehend basic/complex auditory information with cues (SLP). Flowsheets (Taken 01/05/2024 1226) LTG: Patient will comprehend: Basic auditory information LTG: Patient will comprehend auditory information with cueing (SLP): Moderate Assistance - Patient 50 - 74%   Problem: RH Expression Communication Goal: LTG Patient will express needs/wants via multi-modal(SLP) Description: LTG:  Patient will express needs/wants via multi-modal communication (gestures/written, etc) with cues (SLP) Flowsheets (Taken 01/05/2024 1226) LTG: Patient will express needs/wants via multimodal communication (gestures/written, etc) with cueing (SLP): Moderate Assistance - Patient 50 - 74% Goal: LTG Patient will increase speech intelligibility (SLP) Description: LTG: Patient will increase speech intelligibility at word/phrase/conversation level with cues, % of the time (SLP) Flowsheets (Taken 01/05/2024 1226) LTG: Patient will increase speech intelligibility (SLP): Moderate Assistance - Patient 50 - 74% Level: Phrase Percent of time patient will use intelligible speech: 70   Problem: RH Attention Goal: LTG Patient will demonstrate this level of attention during functional activites (SLP) Description: LTG:  Patient will will demonstrate this level of attention during functional activites (SLP) Flowsheets (Taken 01/05/2024 1226) Patient will demonstrate during  cognitive/linguistic activities the attention type of: Sustained Patient will demonstrate this level of attention during cognitive/linguistic activities in: Controlled LTG: Patient will demonstrate this level of attention during cognitive/linguistic activities with assistance of (SLP): Moderate Assistance - Patient 50 - 74% Number of minutes patient will demonstrate attention during cognitive/linguistic activities: 10

## 2024-01-05 NOTE — Plan of Care (Signed)
  Problem: RH Balance Goal: LTG: Patient will maintain dynamic sitting balance (OT) Description: LTG:  Patient will maintain dynamic sitting balance with assistance during activities of daily living (OT) Flowsheets (Taken 01/05/2024 1615) LTG: Pt will maintain dynamic sitting balance during ADLs with: Supervision/Verbal cueing Goal: LTG Patient will maintain dynamic standing with ADLs (OT) Description: LTG:  Patient will maintain dynamic standing balance with assist during activities of daily living (OT)  Flowsheets (Taken 01/05/2024 1615) LTG: Pt will maintain dynamic standing balance during ADLs with: Minimal Assistance - Patient > 75%   Problem: Sit to Stand Goal: LTG:  Patient will perform sit to stand in prep for activites of daily living with assistance level (OT) Description: LTG:  Patient will perform sit to stand in prep for activites of daily living with assistance level (OT) Flowsheets (Taken 01/05/2024 1615) LTG: PT will perform sit to stand in prep for activites of daily living with assistance level: Moderate Assistance - Patient 50 - 74%   Problem: RH Bathing Goal: LTG Patient will bathe all body parts with assist levels (OT) Description: LTG: Patient will bathe all body parts with assist levels (OT) Flowsheets (Taken 01/05/2024 1615) LTG: Pt will perform bathing with assistance level/cueing: Minimal Assistance - Patient > 75%   Problem: RH Dressing Goal: LTG Patient will perform upper body dressing (OT) Description: LTG Patient will perform upper body dressing with assist, with/without cues (OT). Flowsheets (Taken 01/05/2024 1615) LTG: Pt will perform upper body dressing with assistance level of: Minimal Assistance - Patient > 75% Goal: LTG Patient will perform lower body dressing w/assist (OT) Description: LTG: Patient will perform lower body dressing with assist, with/without cues in positioning using equipment (OT) Flowsheets (Taken 01/05/2024 1615) LTG: Pt will perform lower body  dressing with assistance level of: Moderate Assistance - Patient 50 - 74%   Problem: RH Toileting Goal: LTG Patient will perform toileting task (3/3 steps) with assistance level (OT) Description: LTG: Patient will perform toileting task (3/3 steps) with assistance level (OT)  Flowsheets (Taken 01/05/2024 1615) LTG: Pt will perform toileting task (3/3 steps) with assistance level: Moderate Assistance - Patient 50 - 74%   Problem: RH Toilet Transfers Goal: LTG Patient will perform toilet transfers w/assist (OT) Description: LTG: Patient will perform toilet transfers with assist, with/without cues using equipment (OT) Flowsheets (Taken 01/05/2024 1615) LTG: Pt will perform toilet transfers with assistance level of: Moderate Assistance - Patient 50 - 74%   Problem: RH Tub/Shower Transfers Goal: LTG Patient will perform tub/shower transfers w/assist (OT) Description: LTG: Patient will perform tub/shower transfers with assist, with/without cues using equipment (OT) Flowsheets (Taken 01/05/2024 1615) LTG: Pt will perform tub/shower stall transfers with assistance level of: Moderate Assistance - Patient 50 - 74%   Problem: RH Attention Goal: LTG Patient will demonstrate this level of attention during functional activites (OT) Description: LTG:  Patient will demonstrate this level of attention during functional activites  (OT) Flowsheets (Taken 01/05/2024 1615) Patient will demonstrate this level of attention during functional activites: Sustained Patient will demonstrate above attention level in the following environment: Home

## 2024-01-05 NOTE — Progress Notes (Addendum)
 PROGRESS NOTE   Subjective/Complaints:  Opens eyes to voice oriented to person, not place  ROS- limited by cognition   Objective:   No results found. Recent Labs    01/03/24 0329 01/05/24 0526  WBC 6.7 4.6  HGB 15.0 15.1  HCT 44.6 44.6  PLT 124* 113*   Recent Labs    01/03/24 0329 01/05/24 0526  NA 143 144  K 3.7 3.9  CL 111 110  CO2 23 28  GLUCOSE 144* 162*  BUN 27* 29*  CREATININE 0.99 0.89  CALCIUM  9.6 9.8    Intake/Output Summary (Last 24 hours) at 01/05/2024 0830 Last data filed at 01/05/2024 0645 Gross per 24 hour  Intake 450 ml  Output 700 ml  Net -250 ml        Physical Exam: Vital Signs Blood pressure 122/75, pulse 60, temperature 98 F (36.7 C), resp. rate 18, height 5' 11 (1.803 m), weight 63 kg, SpO2 98%. ENT- ortrak in nares  Masked facies  General: No acute distress Mood and affect flat Heart: Regular rate and rhythm no rubs murmurs or extra sounds Lungs: Clear to auscultation, breathing unlabored, no rales or wheezes Abdomen: Positive bowel sounds, soft nontender to palpation, nondistended Extremities: No clubbing, cyanosis, or edema Skin: No evidence of breakdown, no evidence of rash Neurologic: very flat eyes open, minimal movement spontaneously on RIght side , stares straight ahead not tuning head. motor strength is 4/5 in right deltoid, bicep, tricep, grip, hip flexor, knee extensors, ankle dorsiflexor and plantar flexor, 0/5 LUE and LLE Sensory exam limited by attention and concentration  Cerebellar exam could not cooperate Musculoskeletal: Full range of motion in all 4 extremities. No joint swelling Assessment/Plan: 1. Functional deficits which require 3+ hours per day of interdisciplinary therapy in a comprehensive inpatient rehab setting. Physiatrist is providing close team supervision and 24 hour management of active medical problems listed below. Physiatrist and rehab team  continue to assess barriers to discharge/monitor patient progress toward functional and medical goals  Care Tool:  Bathing              Bathing assist       Upper Body Dressing/Undressing Upper body dressing        Upper body assist      Lower Body Dressing/Undressing Lower body dressing            Lower body assist       Toileting Toileting    Toileting assist Assist for toileting: 2 Helpers     Transfers Chair/bed transfer  Transfers assist  Chair/bed transfer activity did not occur: Safety/medical concerns        Locomotion Ambulation   Ambulation assist              Walk 10 feet activity   Assist           Walk 50 feet activity   Assist           Walk 150 feet activity   Assist           Walk 10 feet on uneven surface  activity   Assist  Wheelchair     Assist               Wheelchair 50 feet with 2 turns activity    Assist            Wheelchair 150 feet activity     Assist          Blood pressure 122/75, pulse 60, temperature 98 F (36.7 C), resp. rate 18, height 5' 11 (1.803 m), weight 63 kg, SpO2 98%.  Medical Problem List and Plan: 1. 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  Antithrombotics: -DVT/anticoagulation:  Pharmaceutical: Lovenox              -antiplatelet therapy: ASA/Plavix  X 3 weeks followed by Plavix  alone 3. Pain Management: Tylenol  prn.  4. Mood/Behavior/Sleep: LCSW to follow for evaluation and support.             --hx of sundowning-->continue melatonin 3 mg/Monitor sleep hygeine             -antipsychotic agents: N/A  Reduced level of alertness trial  amantadine  5. Neuropsych/cognition: This patient is not capable of making decisions on fully own behalf. 6. Skin/Wound Care:  Routine pressure relief measures.  7. Fluids/Electrolytes/Nutrition: Continue NPO due to holding of orals.              --on tube feeds.  8. HTN: Monitor BP TID--goal 150 to avoid symptoms (multiple ED visits this year)             --hx of chronic dizziness. Needs to stay hydrated. 9. H/o SVT/CHF s/p PPM/syncope: Monitor for orthostatic symptoms  10. BPH/urinary retention: Urecholine  and Proscar  cannot be crushed per pharmacy so will hold (cannot use Avodart  capsule due to TF).  Will check PVRs 11. Dementia: On Namenda ,  12. Glaucoma: Resume home meds. 13. Dysphagia: NPO with tube feeds. May need PEG for nutritional support.  14. Acute renal failure: Improved rom 24/1.36 to 27/0.99 15. chronic Thrombocytopenia:stable      Latest Ref Rng & Units 01/05/2024    5:26 AM 01/03/2024    3:29 AM 01/02/2024    5:10 AM  CBC  WBC 4.0 - 10.5 K/uL 4.6  6.7  7.3   Hemoglobin 13.0 - 17.0 g/dL 84.8  84.9  81.4   Hematocrit 39.0 - 52.0 % 44.6  44.6  54.5   Platelets 150 - 400 K/uL 113  124  113      16.  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

## 2024-01-05 NOTE — Progress Notes (Signed)
 Inpatient Rehabilitation Care Coordinator Assessment and Plan Patient Details  Name: Dustin Hamilton MRN: [id number] Date of Birth: 15-Jan-1941  Today's Date: 01/05/2024  Hospital Problems: Principal Problem:   Stroke of right basal ganglia [name]  Past Medical History:  Past Medical History:  Diagnosis Date   AMS (altered mental status) 03/15/2023   ARF (acute renal failure) (HCC) 09/30/2023   BPH (benign prostatic hyperplasia)    Cerebral infarction (HCC) 09/13/2012   IMO SNOMED Dx Update Oct 2024     Cerebrovascular disease    Clostridium difficile diarrhea    Congenital anomaly of diaphragm    COVID-19 08/2023   Elevated PSA    Glaucoma, both eyes    Hemorrhoid    Hepatitis B surface antigen positive    02-20-2011   History of adenomatous polyp of colon    2007, 2009 and 2013  tubular adenoma's   History of alcohol  abuse    quit 1963   History of cerebral parenchymal hemorrhage    01/ 2006  left occiptial lobe related to hypertensive crisis   History of CVA (cerebrovascular accident)    09-12-2012  left hippocampus/ amygdala junction and per MRI old white matter infarcts--  per pt residual short- term memory issues   History of fatty infiltration of liver hx visit's at 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    Urethral lesion    urethral mass   UTI (urinary tract infection) 03/15/2023   Past Surgical History:  Past Surgical History:  Procedure Laterality Date   CARDIOVASCULAR STRESS TEST  05/05/2007   normal nuclear study w/ no ischemia/  normal LV fucntion and wall motion , ef60%   COLONOSCOPY  last one 04-06-2012   CYSTO/  LEFT  RETROGRADE PYELOGRAM/ CYTOLOGY WASHINGS/  URETEROSCOPY  03/05/2000   INGUINAL HERNIA REPAIR Bilateral 1965 and 1980's   IR 3D INDEPENDENT WKST  07/31/2022   IR ANGIOGRAM PELVIS SELECTIVE OR SUPRASELECTIVE  07/31/2022   IR ANGIOGRAM SELECTIVE EACH ADDITIONAL VESSEL  07/31/2022   IR ANGIOGRAM SELECTIVE EACH ADDITIONAL VESSEL  07/31/2022   IR ANGIOGRAM SELECTIVE EACH ADDITIONAL VESSEL  07/31/2022   IR ANGIOGRAM SELECTIVE EACH ADDITIONAL VESSEL  07/31/2022   IR EMBO TUMOR ORGAN ISCHEMIA INFARCT INC GUIDE ROADMAPPING  07/31/2022   IR RADIOLOGIST EVAL & MGMT  06/26/2022   IR RADIOLOGIST EVAL & MGMT  08/08/2022   IR RADIOLOGIST EVAL & MGMT  08/21/2022   IR RADIOLOGIST EVAL & MGMT  03/25/2023   IR US  GUIDE VASC ACCESS RIGHT  07/31/2022   LAPAROSCOPIC INGUINAL HERNIA WITH UMBILICAL HERNIA Right 06/24/2007   LIVER BIOPSY  1980's and 2004   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   Procedure: PACEMAKER IMPLANT;  Surgeon: Waddell Danelle ORN, MD;  Location: [facility];  Service: Cardiovascular;  Laterality: N/A;   SVT ABLATION N/A 11/15/2018   Procedure: SVT ABLATION;  Surgeon: Waddell Danelle ORN, MD;  Location: [facility];  Service: Cardiovascular;  Laterality: N/A;   TRANSTHORACIC ECHOCARDIOGRAM  09/13/2012   moderate LVH,  ef 60-65%/  TRANSURETHRAL RESECTION OF BLADDER TUMOR N/A 08/11/2016   Procedure: TRANSURETHRAL RESECTION OF BLADDER TUMOR (TURBT);  Surgeon: Belvie LITTIE Clara, MD;  Location: [facility];  Service: Urology;  Laterality: N/A;   Social History:  reports that he quit smoking about 42 years ago. His smoking use included cigarettes. He started smoking about 47 years ago. He has a 5 pack-year smoking history. He has never used smokeless tobacco. He reports that he does not drink alcohol  and does not use drugs.  Family / Support Systems Marital Status: Married Patient Roles: Spouse, Parent, Other (Comment) (veteran) Spouse/Significant Other: Apolinar  [phone] Children: Bert [phone] lives in [place] Other Supports: Aide via [place] 7 days a week for 4 hours Anticipated Caregiver: Wife can only do supervision due to needs hip replacement and uses a scooter and hurry cane. She voiced they want to take him home from here Ability/Limitations of Caregiver: Wife is limited due to own health issues and aide is only for 4 hours Caregiver Availability: Other (Comment) (supervision level only) Family Dynamics: Close knit with family and extended family. Pt is not able to be awaken and wife is here trying. Pt has been to a SNF last year and will need to see if can be managed at home and may have a PEG this time  Social History Preferred language: English Religion: [employer] Cultural Background: NA Education: HS Health Literacy - How often do you need to have someone help you when you read instructions, pamphlets, or other written material from your doctor or pharmacy?: Patient unable to respond Writes: Yes (according to wife) Employment Status: Retired Marine scientist Issues: NA Guardian/Conservator: None-according to MD pt is not fully capable of making his own decisions will look toward his wife for any decisions while here   Abuse/Neglect Abuse/Neglect Assessment Can Be Completed: Unable to assess, patient is non-responsive or altered mental status Physical Abuse: Denies Verbal Abuse: Denies Sexual Abuse: Denies Exploitation of patient/patient's resources: Denies Self-Neglect: Denies  Patient response to: Social Isolation - How often do you feel lonely or isolated from those around you?: Patient unable to respond  Emotional Status [name] affect, behavior and adjustment status: Pt is not awake nor can be coaxed to open his eyes. Wife is here trying along with therapies. Obtained information from wife who is present he was using a rolllator at home prior to this CVA and had aide from [place] for ADL's. He does have dementia and  parkinsons which will limit him. Recent Psychosocial Issues: multiple health issues and hospitalizations in the past year Psychiatric History: No history according to wife, with dementia and amyloid spells will not benefit from seeing neuro-psych while here Substance Abuse History: NA  Patient / Family Perceptions, Expectations & Goals Pt/Family understanding of illness & functional limitations: Wife is able to verbalize pt's medical issues, she does talk with the MD and tries to be here daily to encourage him and assist with therapies to arouse pt Premorbid pt/family roles/activities: husband, father, retiree, veteran Anticipated changes in roles/activities/participation: resume Pt/family expectations/goals: Wife states:  We hope he can do well here and return home.  Community Resources [employer]: Other (Comment) ([facility] [place]) Premorbid Home Care/DME Agencies: Other (Comment) ([facility] [place] 7 days -4 hours  has bsc, rollator and cane) Transportation available at discharge: wife Is the patient able to respond to transportation needs?: Yes In the past 12 months, has lack of transportation kept you from medical appointments or from getting medications?: No (wife) In the  past 12 months, has lack of transportation kept you from meetings, work, or from getting things needed for daily living?: No (wife)  Discharge Planning Living Arrangements: Spouse/significant other Support Systems: Spouse/significant other, Children, Other relatives, Friends/neighbors Type of Residence: Private residence Insurance Resources: Media planner (specify) ([place]) Financial Resources: Tree surgeon, Family Support Financial Screen Referred: No Living Expenses: Lives with family Money Management: Spouse Does the patient have any problems obtaining your medications?: No Home Management: wife Patient/Family Preliminary Plans: Return home with wife who can only provide supervision due to needs a hip  replacement. [facility] after having a CVA and wife could not take home due to too much care. Will need to await to see if pt able to participate in our program and await evaluations of therapy team Care Coordinator Barriers to Discharge: Decreased caregiver support, Other (comments) Care Coordinator Anticipated Follow Up Needs: HH/OP, SNF  Clinical Impression Pt is not arouseable this am and wife is present trying to assist therapy in getting him awake and responding. Wife has health issues of her own and need pt to be supervision level. Their son lives in [place] and they do have niece and nephew who may be able to assist. Will await therapy evaluations and work on best plan for both of them  Raymonde Asberry MATSU 01/05/2024, 10:15 AM

## 2024-01-05 NOTE — Evaluation (Signed)
 Physical Therapy Assessment and Plan  Patient Details  Name: Dustin Hamilton MRN: [id number] Date of Birth: 03-21-1941  PT Diagnosis: Cognitive deficits, Difficulty walking, Hemiparesis non-dominant, Hypertonia, Impaired cognition, and Impaired sensation Rehab Potential: Fair ELOS: 3-4 weeks   Today's Date: 01/05/2024 PT Individual Time: 8696-8585 PT Individual Time Calculation (min): 71 min    Hospital Problem: Principal Problem:   Stroke of right basal ganglia (HCC)   Past Medical History:  Past Medical History:  Diagnosis Date   AMS (altered mental status) 03/15/2023   ARF (acute renal failure) (HCC) 09/30/2023   BPH (benign prostatic hyperplasia)    Cerebral infarction (HCC) 09/13/2012   IMO SNOMED Dx Update Oct 2024     Cerebrovascular disease    Clostridium difficile diarrhea    Congenital anomaly of diaphragm    COVID-19 08/2023   Elevated PSA    Glaucoma, both eyes    Hemorrhoid    Hepatitis B surface antigen positive    02-20-2011   History of adenomatous polyp of colon    2007, 2009 and 2013  tubular adenoma's   History of alcohol  abuse    quit 1963   History of cerebral parenchymal hemorrhage    01/ 2006  left occiptial lobe related to hypertensive crisis   History of CVA (cerebrovascular accident)    09-12-2012  left hippocampus/ amygdala junction and per MRI old white matter infarcts--  per pt residual short- term memory issues   History of fatty infiltration of liver hx visit's at 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    Urethral lesion    urethral mass   UTI (urinary tract infection) 03/15/2023   Past Surgical  History:  Past Surgical History:  Procedure Laterality Date   CARDIOVASCULAR STRESS TEST  05/05/2007   normal nuclear study w/ no ischemia/  normal LV fucntion and wall motion , ef60%   COLONOSCOPY  last one 04-06-2012   CYSTO/  LEFT RETROGRADE PYELOGRAM/ CYTOLOGY WASHINGS/  URETEROSCOPY  03/05/2000   INGUINAL HERNIA REPAIR Bilateral 1965 and 1980's   IR 3D INDEPENDENT WKST  07/31/2022   IR ANGIOGRAM PELVIS SELECTIVE OR SUPRASELECTIVE  07/31/2022   IR ANGIOGRAM SELECTIVE EACH ADDITIONAL VESSEL  07/31/2022   IR ANGIOGRAM SELECTIVE EACH ADDITIONAL VESSEL  07/31/2022   IR ANGIOGRAM SELECTIVE EACH ADDITIONAL VESSEL  07/31/2022   IR ANGIOGRAM SELECTIVE EACH ADDITIONAL VESSEL  07/31/2022   IR EMBO TUMOR ORGAN ISCHEMIA INFARCT INC GUIDE ROADMAPPING  07/31/2022   IR RADIOLOGIST EVAL & MGMT  06/26/2022   IR RADIOLOGIST EVAL & MGMT  08/08/2022   IR RADIOLOGIST EVAL & MGMT  08/21/2022   IR RADIOLOGIST EVAL & MGMT  03/25/2023   IR US  GUIDE VASC ACCESS RIGHT  07/31/2022   LAPAROSCOPIC INGUINAL HERNIA WITH UMBILICAL HERNIA Right 06/24/2007   LIVER BIOPSY  1980's and 2004   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   Procedure: PACEMAKER IMPLANT;  Surgeon: Waddell Danelle ORN, MD;  Location: [facility] INVASIVE CV LAB;  Service: Cardiovascular;  Laterality: N/A;   SVT ABLATION N/A 11/15/2018  Procedure: SVT ABLATION;  Surgeon: Waddell Danelle ORN, MD;  Location[facility] INVASIVE CV LAB;  Service: Cardiovascular;  Laterality: N/A;   TRANSTHORACIC ECHOCARDIOGRAM  09/13/2012   moderate LVH,  ef 60-65%/     TRANSURETHRAL RESECTION OF BLADDER TUMOR N/A 08/11/2016   Procedure: TRANSURETHRAL RESECTION OF BLADDER TUMOR (TURBT);  Surgeon: Belvie LITTIE Clara, MD;  Location: [facility];  Service: Urology;  Laterality: N/A;    Assessment & Plan Clinical Impression: Patient is a 83 y.o. year old male with recent admission to the hospital on *** with ***.  Patient transferred to CIR on 01/04/2024 .   Patient currently  requires total assist with mobility secondary to muscle weakness and muscle joint tightness, decreased cardiorespiratoy endurance, impaired timing and sequencing, abnormal tone, and unbalanced muscle activation, decreased visual acuity, decreased visual perceptual skills, and field cut, decreased midline orientation, decreased attention to left, and decreased motor planning, decreased initiation, decreased attention, decreased awareness, decreased problem solving, decreased safety awareness, decreased memory, and delayed processing, and decreased sitting balance, decreased standing balance, decreased postural control, hemiplegia, and decreased balance strategies.  Prior to hospitalization, patient was supervision with mobility and lived with Spouse in a House home.  Home access is  Ramped entrance.  Patient will benefit from skilled PT intervention to maximize safe functional mobility, minimize fall risk, and decrease caregiver burden for planned discharge home with 24 hour assist.  Anticipate patient will benefit from follow up [facility] at discharge.  PT - End of Session Activity Tolerance: Tolerates 10 - 20 min activity with multiple rests Endurance Deficit: Yes PT Assessment Rehab Potential (ACUTE/IP ONLY): Fair PT Barriers to Discharge: Incontinence;Nutrition means;Other (comments) (inability to ambulate, significant lethargy) PT Patient demonstrates impairments in the following area(s): Balance;Endurance;Motor;Nutrition;Perception;Safety;Sensory;Other (comment) (alertness) PT Transfers Functional Problem(s): Bed Mobility;Bed to Chair;Car;Furniture PT Locomotion Functional Problem(s): Ambulation;Wheelchair Mobility PT Plan PT Intensity: Minimum of 1-2 x/day ,45 to 90 minutes PT Frequency: 5 out of 7 days PT Duration Estimated Length of Stay: 3-4 weeks PT Treatment/Interventions: Ambulation/gait training;DME/adaptive equipment instruction;Neuromuscular re-education;Psychosocial support;Stair  training;UE/LE Strength taining/ROM;Wheelchair propulsion/positioning;Balance/vestibular training;Discharge planning;Functional electrical stimulation;Therapeutic Activities;UE/LE Coordination activities;Visual/perceptual remediation/compensation;Therapeutic Exercise;Splinting/orthotics;Patient/family education;Functional mobility training;Disease management/prevention;Cognitive remediation/compensation PT Transfers Anticipated Outcome(s): ModA PT Locomotion Anticipated Outcome(s): MaxA for ambulation; ModA for w/c mobilty PT Recommendation Recommendations for Other Services: None Follow Up Recommendations: Home health PT;Skilled nursing facility;24 hour supervision/assistance (pt's progress will determine home vs SNF) Patient destination: Skilled Nursing Facility (SNF) (pt's progress will determine home vs SNF) Equipment Recommended: To be determined   PT Evaluation Precautions/Restrictions Precautions Precautions: Fall Precaution/Restrictions Comments: L hemipareisis, NPO, Cor[employer]k Restrictions Weight Bearing Restrictions Per Provider Order: No General   Vital SignsTherapy Vitals Temp: 98.1 F (36.7 C) Pulse Rate: 81 Resp: 17 BP: (!) 141/93 Patient Position (if appropriate): Lying Oxygen Therapy SpO2: 97 % O2 Device: Room Air Pain Pain Assessment Pain Scale: 0-10 Pain Score: 0-No pain Pain Interference Pain Interference Pain Effect on Sleep: 1. Rarely or not at all Pain Interference with Therapy Activities: 1. Rarely or not at all Pain Interference with Day-to-Day Activities: 1. Rarely or not at all Home Living/Prior Functioning Home Living Available Help at Discharge: Family;Available 24 hours/day Type of Home: House Home Access: Ramped entrance Home Layout: One level Bathroom Shower/Tub: Health visitor: Standard Bathroom Accessibility: Yes Additional Comments: Aide x3 hrs 4day/wk. Pt with limited communication, PLOF and setup from past notes  Lives  With: Spouse Prior Function Level of Independence: Needs assistance with ADLs  Able to Take Stairs?: No  Driving: No Vocation: Retired Optometrist - History Ability to See in Adequate Light: 2 Moderately impaired Vision - Assessment Eye Alignment: Within Functional Limits Ocular Range of Motion: Impaired-to be further tested in functional context;Other (comment);Restricted on the left Alignment/Gaze Preference: Chin down Tracking/Visual Pursuits: Impaired - to be further tested in functional context (initiated eye testing from 76ft away but then unable to follow instructions when closer to pt) Perception Perception: Impaired Preception Impairment Details: Inattention/Neglect Praxis Praxis: Impaired Praxis Impairment Details: Motor planning;Organization;Initiation  Cognition Overall Cognitive Status: Difficult to assess (Very lethargic during session, barely opening eyes and requiring signifnciant amount of time for task initation) Arousal/Alertness: Lethargic Orientation Level: Oriented to person Attention: Focused;Sustained Focused Attention: Impaired Sustained Attention: Impaired Memory: Impaired Awareness: Impaired Problem Solving: Impaired Executive Function: Initiating;Self Monitoring;Self Correcting Initiating: Impaired Self Monitoring: Impaired Self Correcting: Impaired Safety/Judgment: Impaired Sensation Sensation Light Touch Impaired Details: Impaired LUE;Impaired LLE Coordination Gross Motor Movements are Fluid and Coordinated: No Fine Motor Movements are Fluid and Coordinated: No Heel Shin Test: unable to test Motor  Motor Motor: Other (comment) (hemipareisis) Motor - Skilled Clinical Observations: L hemipareisis with mild tone noted in L elbow/shoulder and LLE into extension   Trunk/Postural Assessment  Cervical Assessment Cervical Assessment: Exceptions to [facility] (forward head) Thoracic Assessment Thoracic Assessment: Exceptions to [facility] (rounded  shoulders) Lumbar Assessment Lumbar Assessment: Exceptions to [facility] (posterior pelvic tilt) Postural Control Postural Control: Deficits on evaluation Trunk Control: decreased Righting Reactions: significantly decreased Protective Responses: significantly decreased  Balance Balance Balance Assessed: Yes Static Sitting Balance Static Sitting - Balance Support: Feet supported Static Sitting - Level of Assistance: 3: Mod assist Dynamic Sitting Balance Dynamic Sitting - Balance Support: Feet supported Dynamic Sitting - Level of Assistance: 2: Max assist Sitting balance - Comments: no postural reactions noted Static Standing Balance Static Standing - Balance Support: During functional activity Static Standing - Level of Assistance: 1: +2 Total assist Extremity Assessment      RLE Assessment RLE Assessment: Exceptions to [facility] General Strength Comments: grossly 3-/5 for understood instructions LLE Assessment LLE Assessment: Exceptions to [facility] General Strength Comments: grossly 2-/5 LLE Tone LLE Tone: Mild;Moderate (into extension)  Care Tool Care Tool Bed Mobility Roll left and right activity   Roll left and right assist level: Dependent - Patient 0%    Sit to lying activity   Sit to lying assist level: 2 Helpers    Lying to sitting on side of bed activity   Lying to sitting on side of bed assist level: the ability to move from lying on the back to sitting on the side of the bed with no back support.: 2 Helpers     Care Tool Transfers Sit to stand transfer   Sit to stand assist level: 2 Helpers    Chair/bed transfer   Chair/bed transfer assist level: 2 Web designer transfer activity did not occur: Safety/medical concerns        Care Tool Locomotion Ambulation Ambulation activity did not occur: Safety/medical concerns        Walk 10 feet activity Walk 10 feet activity did not occur: Safety/medical concerns       Walk 50 feet with 2 turns activity  Walk 50 feet with 2 turns activity did not occur: Safety/medical concerns      Walk 150 feet activity Walk 150 feet activity did not occur: Safety/medical concerns      Walk 10 feet on uneven surfaces activity Walk 10 feet on  uneven surfaces activity did not occur: Safety/medical concerns      Stairs Stair activity did not occur: Safety/medical concerns        Walk up/down 1 step activity Walk up/down 1 step or curb (drop down) activity did not occur: Safety/medical concerns      Walk up/down 4 steps activity Walk up/down 4 steps activity did not occur: Safety/medical concerns      Walk up/down 12 steps activity Walk up/down 12 steps activity did not occur: Safety/medical concerns      Pick up small objects from floor Pick up small object from the floor (from standing position) activity did not occur: Safety/medical concerns      Wheelchair Is the patient using a wheelchair?: Yes (did not use during eval but is expected to use throughout stay and will use on d/c) Type of Wheelchair: Manual Wheelchair activity did not occur: Safety/medical concerns      Wheel 50 feet with 2 turns activity Wheelchair 50 feet with 2 turns activity did not occur: Safety/medical concerns    Wheel 150 feet activity Wheelchair 150 feet activity did not occur: Safety/medical concerns      Refer to Care Plan for Long Term Goals  SHORT TERM GOAL WEEK 1 PT Short Term Goal 1 (Week 1): Pt will perform bed rolling with MaxA +1. PT Short Term Goal 2 (Week 1): Pt will perform supine<>sit with MaxA +1. PT Short Term Goal 3 (Week 1): Pt will perform sit<>stand with MaxA +2. PT Short Term Goal 4 (Week 1): Pt will maintain unsupported seated balance with MinA for more than 3 min. PT Short Term Goal 5 (Week 1): Pt will demonstrate improved initiation by following more than 50% of verbal instructions for mobility.  Recommendations for other services: None   Skilled Therapeutic Intervention Mobility Bed  Mobility Bed Mobility: Rolling Right;Sit to Supine;Supine to Sit;Rolling Left Rolling Right: 2 Helpers;Total Assistance - Patient < 25% Rolling Left: Total Assistance - Patient < 25%;2 Helpers Supine to Sit: Dependent - Patient equal 0% Sit to Supine: Dependent - Patient equal 0% Transfers Transfers: Sit to Stand;Stand to Sit;Stand Pivot Transfers Sit to Stand: 2 Helpers;Dependent - Patient 0% Stand to Sit: 2 Helpers;Dependent - Patient equal 0% Stand Pivot Transfers: Dependent - mechanical lift;2 Helpers Transfer via Lift Equipment: Youth worker Ambulation: No Gait Gait: No Stairs / Additional Locomotion Stairs: No Wheelchair Mobility Wheelchair Mobility: No   Discharge Criteria: Patient will be discharged from PT if patient refuses treatment 3 consecutive times without medical reason, if treatment goals not met, if there is a change in medical status, if patient makes no progress towards goals or if patient is discharged from hospital.  The above assessment, treatment plan, treatment alternatives and goals were discussed and mutually agreed upon: by patient and by family  Mliss DELENA Milliner 01/05/2024, 5:49 PM

## 2024-01-05 NOTE — Evaluation (Signed)
 Speech Language Pathology Assessment and Plan  Patient Details  Name: Dustin Hamilton MRN: [id number] Date of Birth: [birth year]  SLP Diagnosis: Aphasia;Speech and Language deficits;Dysphagia;Cognitive Impairments;Dysarthria  Rehab Potential: Good ELOS: 4 weeks    Today's Date: 01/05/2024 SLP Individual Time: 1100-1155 SLP Individual Time Calculation (min): 55 min   Hospital Problem: Principal Problem:   Stroke of right basal ganglia (HCC)  Past Medical History:  Past Medical History:  Diagnosis Date   AMS (altered mental status) 03/15/2023   ARF (acute renal failure) (HCC) 09/30/2023   BPH (benign prostatic hyperplasia)    Cerebral infarction (HCC) 09/13/2012   IMO SNOMED Dx Update Oct 2024     Cerebrovascular disease    Clostridium difficile diarrhea    Congenital anomaly of diaphragm    COVID-19 08/2023   Elevated PSA    Glaucoma, both eyes    Hemorrhoid    Hepatitis B surface antigen positive    02-20-2011   History of adenomatous polyp of colon    2007, 2009 and 2013  tubular adenoma's   History of alcohol  abuse    quit 1963   History of cerebral parenchymal hemorrhage    01/ 2006  left occiptial lobe related to hypertensive crisis   History of CVA (cerebrovascular accident)    09-12-2012  left hippocampus/ amygdala junction and per MRI old white matter infarcts--  per pt residual short- term memory issues   History of fatty infiltration of liver hx visit's at 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    Urethral lesion    urethral mass   UTI (urinary tract infection) 03/15/2023   Past Surgical History:  Past Surgical History:   Procedure Laterality Date   CARDIOVASCULAR STRESS TEST  05/05/2007   normal nuclear study w/ no ischemia/  normal LV fucntion and wall motion , ef60%   COLONOSCOPY  last one 04-06-2012   CYSTO/  LEFT RETROGRADE PYELOGRAM/ CYTOLOGY WASHINGS/  URETEROSCOPY  03/05/2000   INGUINAL HERNIA REPAIR Bilateral 1965 and 1980's   IR 3D INDEPENDENT WKST  07/31/2022   IR ANGIOGRAM PELVIS SELECTIVE OR SUPRASELECTIVE  07/31/2022   IR ANGIOGRAM SELECTIVE EACH ADDITIONAL VESSEL  07/31/2022   IR ANGIOGRAM SELECTIVE EACH ADDITIONAL VESSEL  07/31/2022   IR ANGIOGRAM SELECTIVE EACH ADDITIONAL VESSEL  07/31/2022   IR ANGIOGRAM SELECTIVE EACH ADDITIONAL VESSEL  07/31/2022   IR EMBO TUMOR ORGAN ISCHEMIA INFARCT INC GUIDE ROADMAPPING  07/31/2022   IR RADIOLOGIST EVAL & MGMT  06/26/2022   IR RADIOLOGIST EVAL & MGMT  08/08/2022   IR RADIOLOGIST EVAL & MGMT  08/21/2022   IR RADIOLOGIST EVAL & MGMT  03/25/2023   IR US  GUIDE VASC ACCESS RIGHT  07/31/2022   LAPAROSCOPIC INGUINAL HERNIA WITH UMBILICAL HERNIA Right 06/24/2007   LIVER BIOPSY  1980's and 2004   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   Procedure: PACEMAKER IMPLANT;  Surgeon: Waddell Danelle ORN, MD;  Location: [facility] INVASIVE CV LAB;  Service: Cardiovascular;  Laterality: N/A;   SVT ABLATION N/A 11/15/2018   Procedure: SVT ABLATION;  Surgeon:  Waddell Danelle ORN, MD;  Location: [facility] INVASIVE CV LAB;  Service: Cardiovascular;  Laterality: N/A;   TRANSTHORACIC ECHOCARDIOGRAM  09/13/2012   moderate LVH,  ef 60-65%/     TRANSURETHRAL RESECTION OF BLADDER TUMOR N/A 08/11/2016   Procedure: TRANSURETHRAL RESECTION OF BLADDER TUMOR (TURBT);  Surgeon: Belvie LITTIE Clara, MD;  Location: [facility];  Service: Urology;  Laterality: N/A;    Assessment / Plan / Recommendation Clinical Impression HPI:  Dustin Hamilton is a 83 y.o. male with medical history significant of hypertension, hyperlipidemia, TIA, CVA, amyloid angiopathy, paroxysmal SVT status post ablation,  complete heart block status post pacemaker, secondary parkinsonism, dementia, depression, OSA, gout, BPH, glaucoma, chronic pain who presented to [facility] on 12/30/23 with left-sided deficits as a code stroke.  Vital signs in the ED notable for blood pressure in the 120s-160s systolic.  Lab workup included CMP with potassium 3.3, chloride 117, calcium  8.0, protein 5.6, albumin 2.9.  CBC with hemoglobin 11.8 down from baseline of 15-16, platelets 92.  PT 16.3, INR normal, PTT normal.  Ethanol level negative.  Urinalysis with hemoglobin, protein, small leukocytes, rare bacteria.  CT head showed no acute abnormality. CTA head & neck New focal moderate stenosis of the distal M1 segment of the right MCA. Redemonstrated occlusion of the proximal P2 segment of the left PCA with reconstitution at the mid P2 segment. Focal moderate stenosis of the V4 segment right vertebral artery. Mild atherosclerosis at the right and left carotid bifurcations without hemodynamically significant stenosis. MRI showed Acute infarct in the right basal ganglia. Remote left PCA territory infarcts and additional small remote infarcts in the right occipital cortex within the right PCA territory. 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 Moderate generalized parenchymal volume loss.2D Echo with EF 55-60%. Pt. Seen by PT/OT/SLP and they recommend CIR to assist return to PLOF.   Clinical Impression:  Bedside Swallow Evaluation: A bedside swallow evaluation was completed to assess for s/sx of oropharyngeal dysphagia. Oral mechanism exam revealed significant oral weakness and L asymmetry. POs administered included ice chips and tsp sips of thin liquids. Patient required cues to initiate swallow and immediate cough w/ thin liquids via tsp. Patient with wet vocal quality before/after PO likely due to aspiration of secretions with anterior spillage from the L. Recommend  continuation of NPO w/ alternative means of nutrition, hydration and medication administration. Continue PO trials w SLP.  Cognitive Linguistic: Patient presents with severe expressive and receptive language deficits. Upon entrance, patient was very lethargic requiring max verbal and tactile A to awaken. Receptive language is characterized by ~25% accuracy during simple yes/no questions with use of gestures and 80% accuracy with simple one step directions. Expressive language is remarkable for reduced initiation requiring extra processing time, perseverations and word finding deficits. Patient with 50% accuracy during confrontational naming tasks and 100% during simple responsive naming activity. Patient required minA and extra processing time to verbalize automatic speech tasks including name, counting, days of the week and months of the year. Patient is approximately 50% intelligible at the word level likely due to oral deficits. Patient unable to read single words, though questionably if due to visual deficits. Recommend targeting expressive and receptive language during inpatient rehabilitation stay.  Pt would benefit from skilled ST services to maximize communication, cognition and dysphagia in order to maximize functional independence at d/c. Anticipate patient will require 24 hour supervision at d/c and f/u SLP services.    Skilled Therapeutic  Interventions          Patient evaluated using a non-standardized cognitive linguistic assessment and bedside swallow evaluation to assess current cognitive, communicative and swallowing function. See above for details.    SLP Assessment  Patient will need skilled Speech Lanaguage Pathology Services during CIR admission    Recommendations  SLP Diet Recommendations: NPO;Alternative means - long-term Medication Administration: Via alternative means Oral Care Recommendations: Oral care QID Patient destination: Home Follow up Recommendations: Home Health  SLP;Outpatient SLP;24 hour supervision/assistance Equipment Recommended: None recommended by SLP    SLP Frequency 3 to 5 out of 7 days   SLP Duration  SLP Intensity  SLP Treatment/Interventions 4 weeks  Minumum of 1-2 x/day, 30 to 90 minutes  Cognitive remediation/compensation;Cueing hierarchy;Dysphagia/aspiration precaution training;Functional tasks;Internal/external aids;Multimodal communication approach;Oral motor exercises;Patient/family education;Speech/Language facilitation;Therapeutic Activities    Pain None visualized   SLP Evaluation Cognition Overall Cognitive Status: Difficult to assess Arousal/Alertness: Awake/alert Orientation Level: Oriented to person Year:  (DTA due to language impairments) Attention: Sustained;Focused Focused Attention: Impaired Focused Attention Impairment: Verbal basic;Functional basic Sustained Attention: Impaired Sustained Attention Impairment: Functional basic;Verbal basic Memory:  (DTA due to language impairments) Awareness:  (DTA due to language impairments) Problem Solving: Impaired Problem Solving Impairment: Functional basic  Comprehension Auditory Comprehension Overall Auditory Comprehension: Impaired Yes/No Questions: Impaired Basic Biographical Questions: 26-50% accurate Basic Immediate Environment Questions: 25-49% accurate Commands: Impaired One Step Basic Commands: 75-100% accurate Interfering Components: Visual impairments;Attention Expression Expression Primary Mode of Expression: Verbal Verbal Expression Overall Verbal Expression: Impaired Initiation: Impaired Automatic Speech: Name;Social Response;Counting;Day of week;Month of year;Singing Level of Generative/Spontaneous Verbalization: Word Repetition:  (able to repeat at the single word level) Naming: No impairment Impairments: Abnormal affect;Eye contact;Monotone Interfering Components: Attention;Speech intelligibility Effective Techniques: Semantic  cues;Phonemic cues Oral Motor Oral Motor/Sensory Function Overall Oral Motor/Sensory Function: Moderate impairment (DTA due to language impairments) Facial ROM: Reduced left Facial Symmetry: Abnormal symmetry left Facial Strength: Reduced left Lingual ROM: Reduced left Lingual Strength: Reduced Motor Speech Overall Motor Speech: Impaired Respiration: Impaired Level of Impairment: Word Articulation: Impaired Level of Impairment: Word Intelligibility: Intelligibility reduced Word: 50-74% accurate Motor Planning: Within functional limits Effective Techniques: Increased vocal intensity;Over-articulate  Care Tool Care Tool Cognition Ability to hear (with hearing aid or hearing appliances if normally used Ability to hear (with hearing aid or hearing appliances if normally used): 0. Adequate - no difficulty in normal conservation, social interaction, listening to TV   Expression of Ideas and Wants Expression of Ideas and Wants: 1. Rarely/Never expressess or very difficult - rarely/never expresses self or speech is very difficult to understand   Understanding Verbal and Non-Verbal Content Understanding Verbal and Non-Verbal Content: 1. Rarely/never understands  Memory/Recall Ability Memory/Recall Ability : None of the above were recalled    Bedside Swallowing Assessment General Date of Onset: 12/30/23 Previous Swallow Assessment: 8/1 Diet Prior to this Study: NPO Respiratory Status: Room air Behavior/Cognition: Cooperative;Requires cueing;Lethargic/Drowsy Oral Cavity - Dentition: Adequate natural dentition Self-Feeding Abilities: Total assist Patient Positioning: Upright in bed Volitional Cough: Cognitively unable to elicit Volitional Swallow: Able to elicit  Ice Chips Ice chips: Impaired Oral Phase Impairments: Impaired mastication;Poor awareness of bolus Oral Phase Functional Implications: Oral holding Thin Liquid Thin Liquid: Impaired Presentation: Spoon Oral Phase  Impairments: Poor awareness of bolus;Reduced lingual movement/coordination Oral Phase Functional Implications: Left anterior spillage;Oral holding Pharyngeal  Phase Impairments: Suspected delayed Swallow;Cough - Immediate;Decreased hyoid-laryngeal movement;Multiple swallows;Wet Vocal Quality Nectar Thick Nectar Thick Liquid: Not tested Honey Thick Honey Thick Liquid: Not tested  Puree Puree: Not tested Solid Solid: Not tested BSE Assessment Risk for Aspiration Impact on safety and function: Severe aspiration risk;Risk for inadequate nutrition/hydration Other Related Risk Factors: Cognitive impairment;Deconditioning;Previous CVA;Lethargy  Short Term Goals: Week 1: SLP Short Term Goal 1 (Week 1): Patient will demonstrate readiness for instrumental swallow evaluation SLP Short Term Goal 2 (Week 1): Patient will answer simple yes/no questions with 50% accuracy given max multimodal A SLP Short Term Goal 3 (Week 1): Patient will utilize gestures/verbalizations to communicate basic wants/needs with max multimodal A SLP Short Term Goal 4 (Week 1): Patient will sustain attention to functional tasks for 1 minute given max multimodal A  Refer to Care Plan for Long Term Goals  Recommendations for other services: None   Discharge Criteria: Patient will be discharged from SLP if patient refuses treatment 3 consecutive times without medical reason, if treatment goals not met, if there is a change in medical status, if patient makes no progress towards goals or if patient is discharged from hospital.  The above assessment, treatment plan, treatment alternatives and goals were discussed and mutually agreed upon: by patient and by family  Michelle Wnek M.A., CCC-SLP 01/05/2024, 12:28 PM

## 2024-01-05 NOTE — Evaluation (Signed)
 Occupational Therapy Assessment and Plan  Patient Details  Name: Dustin Hamilton MRN: [id number] Date of Birth: 1940-07-31  OT Diagnosis: abnormal posture, disturbance of vision, hemiplegia affecting non-dominant side, and muscle weakness (generalized) Rehab Potential: Rehab Potential (ACUTE ONLY): Fair ELOS: 4 weeks   Today's Date: 01/05/2024 OT Individual Time: 9064-8954 OT Individual Time Calculation (min): 70 min     Hospital Problem: Principal Problem:   Stroke of right basal ganglia (HCC)   Past Medical History:  Past Medical History:  Diagnosis Date   AMS (altered mental status) 03/15/2023   ARF (acute renal failure) (HCC) 09/30/2023   BPH (benign prostatic hyperplasia)    Cerebral infarction (HCC) 09/13/2012   IMO SNOMED Dx Update Oct 2024     Cerebrovascular disease    Clostridium difficile diarrhea    Congenital anomaly of diaphragm    COVID-19 08/2023   Elevated PSA    Glaucoma, both eyes    Hemorrhoid    Hepatitis B surface antigen positive    02-20-2011   History of adenomatous polyp of colon    2007, 2009 and 2013  tubular adenoma's   History of alcohol  abuse    quit 1963   History of cerebral parenchymal hemorrhage    01/ 2006  left occiptial lobe related to hypertensive crisis   History of CVA (cerebrovascular accident)    09-12-2012  left hippocampus/ amygdala junction and per MRI old white matter infarcts--  per pt residual short- term memory issues   History of fatty infiltration of liver hx visit's at 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    Urethral lesion    urethral mass   UTI (urinary tract infection)  03/15/2023   Past Surgical History:  Past Surgical History:  Procedure Laterality Date   CARDIOVASCULAR STRESS TEST  05/05/2007   normal nuclear study w/ no ischemia/  normal LV fucntion and wall motion , ef60%   COLONOSCOPY  last one 04-06-2012   CYSTO/  LEFT RETROGRADE PYELOGRAM/ CYTOLOGY WASHINGS/  URETEROSCOPY  03/05/2000   INGUINAL HERNIA REPAIR Bilateral 1965 and 1980's   IR 3D INDEPENDENT WKST  07/31/2022   IR ANGIOGRAM PELVIS SELECTIVE OR SUPRASELECTIVE  07/31/2022   IR ANGIOGRAM SELECTIVE EACH ADDITIONAL VESSEL  07/31/2022   IR ANGIOGRAM SELECTIVE EACH ADDITIONAL VESSEL  07/31/2022   IR ANGIOGRAM SELECTIVE EACH ADDITIONAL VESSEL  07/31/2022   IR ANGIOGRAM SELECTIVE EACH ADDITIONAL VESSEL  07/31/2022   IR EMBO TUMOR ORGAN ISCHEMIA INFARCT INC GUIDE ROADMAPPING  07/31/2022   IR RADIOLOGIST EVAL & MGMT  06/26/2022   IR RADIOLOGIST EVAL & MGMT  08/08/2022   IR RADIOLOGIST EVAL & MGMT  08/21/2022   IR RADIOLOGIST EVAL & MGMT  03/25/2023   IR US  GUIDE VASC ACCESS RIGHT  07/31/2022   LAPAROSCOPIC INGUINAL HERNIA WITH UMBILICAL HERNIA Right 06/24/2007   LIVER BIOPSY  1980's and 2004   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   Procedure: PACEMAKER IMPLANT;  Surgeon: Waddell Danelle ORN, MD;  Location: [facility] INVASIVE CV LAB;  Service: Cardiovascular;  Laterality: N/A;  SVT ABLATION N/A 11/15/2018   Procedure: SVT ABLATION;  Surgeon: Waddell Danelle ORN, MD;  Location[facility] INVASIVE CV LAB;  Service: Cardiovascular;  Laterality: N/A;   TRANSTHORACIC ECHOCARDIOGRAM  09/13/2012   moderate LVH,  ef 60-65%/     TRANSURETHRAL RESECTION OF BLADDER TUMOR N/A 08/11/2016   Procedure: TRANSURETHRAL RESECTION OF BLADDER TUMOR (TURBT);  Surgeon: Belvie LITTIE Clara, MD;  Location: [facility];  Service: Urology;  Laterality: N/A;    Assessment & Plan Clinical Impression: Dustin Hamilton is an 83 year old male with history of CVA, ICH, SVT, CHB s/p PPM,  fatty liver, Parkinsonism, dementia  w/sundowning, cerebral angiopathy w/multiple amyloid spells (transient focal neurologic deficits), orthostatic hypotension w/syncope;  who was presented to ED on 12/30/23 with decrease in LOC, left sided weakness, right gaze deviation and left facial droop. CTA head/neck showed new focal moderate stenosis of distal M1 segment of R-MCA, old P2 segment occlusion of L-PCA and focal moderate stenosis of V4 segment of R-VA.  MRI brain done reveling acute infarct in Right basal ganglia with remote L-PCA territory infarcts, numerous chronic microhemorrhages c/w hx of cerebral amyloid angiopathy and moderate generalized parenchymal volume loss.  2 D echo showed EF 55-65% with moderate calcification of R-AV and negative for shunt but study limited due to body habitus.    Dr. Rosemarie recommends DAPT X 3 weeks followed by Plavix  alone. Hospital course significant for pseudobulbar affect with non verbal state, increased tone in all extremities with drift to the left, ability to intermittently follow simple commands, tends to keep eyes closed and opens mouth reflexively but with poor oral awareness and significant oral holding. He was made NPO and started on tube feeds for nutritional support.  He did have decrease in responsiveness with hypotension on early am of 08/02 and improved with  fluid bolus X 1 liter. Noted elevated lactate and to be dehydrated. CXR with mild Right base atelectasis.  CT head showed expected evolution without hemorrhagic transformation.    PT/OT/ST has been working with patient who requires cues to keep eyes open, is limited by cognitive deficits with poor initiation, requires  max to total assist for bed mobility, +2 mod assist to sit at EOB with left lateral lean. for bed mobility and total assist with ADLs. Prior to admission, he was able to ambulate with rollater in home setting and  needed assistance with ADLs/IADLs. He has a Comptroller 4 hours/7 days a week. Patient transferred to CIR on 01/04/2024  .    Patient currently requires total with basic self-care skills secondary to muscle weakness and muscle joint tightness, decreased cardiorespiratoy endurance, impaired timing and sequencing, abnormal tone, unbalanced muscle activation, decreased coordination, and decreased motor planning, decreased visual motor skills and field cut, decreased midline orientation, decreased attention to left, and decreased motor planning, decreased initiation, decreased attention, decreased awareness, decreased problem solving, decreased safety awareness, decreased memory, and delayed processing, central origin, and decreased sitting balance, decreased standing balance, decreased postural control, hemiplegia, and decreased balance strategies.  Prior to hospitalization, patient could complete ADL with min.  Patient will benefit from skilled intervention to decrease level of assist with basic self-care skills and increase independence with basic self-care skills prior to discharge home with care partner.  Anticipate patient will require 24 hour supervision and moderate physical assestance and follow up home health.  OT - End of Session Activity Tolerance: Decreased this session Endurance Deficit: Yes OT Assessment Rehab Potential (ACUTE ONLY): Fair OT Barriers to  Discharge: Decreased caregiver support OT Patient demonstrates impairments in the following area(s): Balance;Endurance;Perception;Vision;Motor;Safety;Cognition;Sensory;Pain;Skin Integrity OT Basic ADL's Functional Problem(s): Dressing;Bathing;Grooming OT Advanced ADL's Functional Problem(s): None OT Transfers Functional Problem(s): Toilet;Tub/Shower OT Additional Impairment(s): Fuctional Use of Upper Extremity OT Plan OT Intensity: Minimum of 1-2 x/day, 45 to 90 minutes OT Frequency: 5 out of 7 days OT Duration/Estimated Length of Stay: 4 weeks OT Treatment/Interventions: Functional electrical stimulation;Balance/vestibular training;Cognitive  remediation/compensation;Community reintegration;Discharge planning;Disease mangement/prevention;DME/adaptive equipment instruction;Functional mobility training;Neuromuscular re-education;Patient/family education;Pain management;Psychosocial support;Self Care/advanced ADL retraining;Skin care/wound managment;Therapeutic Activities;Splinting/orthotics;Therapeutic Exercise;UE/LE Strength taining/ROM;UE/LE Coordination activities;Visual/perceptual remediation/compensation;Wheelchair propulsion/positioning OT Self Feeding Anticipated Outcome(s): SUP OT Basic Self-Care Anticipated Outcome(s): MOD A OT Toileting Anticipated Outcome(s): MOD A OT Bathroom Transfers Anticipated Outcome(s): MOD A OT Recommendation Recommendations for Other Services: None Patient destination: Home Follow Up Recommendations: Home health OT Equipment Recommended: To be determined   OT Evaluation Precautions/Restrictions  Precautions Precautions: Fall Recall of Precautions/Restrictions: Impaired Precaution/Restrictions Comments: NPO, CorTrak Restrictions Weight Bearing Restrictions Per Provider Order: No General Chart Reviewed: Yes Additional Pertinent History: hypertension, hyperlipidemia, TIA, CVA, amyloid angiopathy, paroxysmal SVT status post ablation, complete heart block status post pacemaker, secondary parkinsonism, dementia, depression, OSA, gout, BPH, glaucoma Family/Caregiver Present: Yes (wife) Home Living/Prior Functioning Home Living Family/patient expects to be discharged to:: Private residence Living Arrangements: Spouse/significant other Available Help at Discharge: Family, Available 24 hours/day Type of Home: House Home Access: Ramped entrance Home Layout: One level Bathroom Shower/Tub: Health visitor: Standard Bathroom Accessibility: Yes Additional Comments: Aide x3 hrs 4day/wk. Pt with limited communication, PLOF and setup from past notes  Lives With: Spouse IADL  History Homemaking Responsibilities: No Current License: No Occupation: Retired, On disability Prior Function Level of Independence: Needs assistance with ADLs Bath: Minimal Toileting: Minimal Dressing: Minimal Grooming: Moderate  Able to Take Stairs?: No Driving: No Vocation: Retired Administrator, sports Baseline Vision/History: 3 Glaucoma (L peripheral vision deficit 2/2 previous CVA) Ability to See in Adequate Light: 2 Moderately impaired Patient Visual Report: Other (comment) (did not report or verbalize during session) Vision Assessment?: Vision impaired- to be further tested in functional context;Yes Eye Alignment: Within Functional Limits Ocular Range of Motion: Impaired-to be further tested in functional context;Other (comment) (Pt barely opening eyes, did not track therapist) Alignment/Gaze Preference: Chin down Tracking/Visual Pursuits: Impaired - to be further tested in functional context Saccades: Impaired - to be further tested in functional context Visual Fields: Left visual field deficit (Known L visual field deficit from prevous CVA) Additional Comments: Pt noted to have L inattention, however some L inattention reported at baseline per wife Perception  Perception: Impaired Perception-Other Comments: left, possible R/L discrimination Praxis Praxis: Impaired Praxis Impairment Details: Motor planning;Organization Cognition Cognition Overall Cognitive Status: Difficult to assess (Pt very lethargic during session, barely opening eyes and requiring signifnciantly increased amount of time for task initation) Arousal/Alertness: Lethargic Memory:  (DTA d/t language deficit) Attention: Sustained;Focused Focused Attention: Impaired Focused Attention Impairment: Functional basic Sustained Attention: Impaired Sustained Attention Impairment: Functional basic Awareness:  (DTA d/t language deficits and lethargy) Problem Solving: Impaired Problem Solving Impairment: Functional  basic Executive Function:  (all impaired d/t lower level deficits) Safety/Judgment: Impaired Comments: DTA d/t language deficits and lethargy Brief Interview for Mental Status (BIMS) Repetition of Three Words (First Attempt):  (unable to complete d/t lethargy and Pt nonverbal during session) Sensation Sensation Light Touch: Impaired Detail Light Touch Impaired Details: Impaired LUE;Impaired LLE Hot/Cold: Not tested Proprioception: Not tested Stereognosis: Not tested Coordination Gross Motor Movements are Fluid and Coordinated: No Fine Motor Movements are  Fluid and Coordinated: No Finger Nose Finger Test: unable to complete Motor  Motor Motor: Hemiplegia;Other (comment) (will continue to assess) Motor - Skilled Clinical Observations: L hemiplegia with mild tone noted in L elbow/shoulder  Trunk/Postural Assessment  Cervical Assessment Cervical Assessment: Exceptions to [facility] (forward head) Thoracic Assessment Thoracic Assessment: Exceptions to [facility] (rounded shoudlers) Lumbar Assessment Lumbar Assessment: Exceptions to WFL (rigid pelivs) Postural Control Postural Control: Deficits on evaluation Trunk Control: decreased  Balance Balance Balance Assessed: Yes Static Sitting Balance Static Sitting - Balance Support: Feet unsupported Static Sitting - Level of Assistance: 4: Min assist (CGA-MIN A) Dynamic Sitting Balance Dynamic Sitting - Balance Support: Feet supported Dynamic Sitting - Level of Assistance: 2: Max assist;3: Mod assist Sitting balance - Comments: no postural reactions noted Static Standing Balance Static Standing - Balance Support: During functional activity Static Standing - Level of Assistance: 1: +2 Total assist Extremity/Trunk Assessment RUE Assessment RUE Assessment: Within Functional Limits LUE Assessment LUE Assessment: Exceptions to [facility] Passive Range of Motion (PROM) Comments: only assessed to 90 degrees d/t tone at shoulder and to prevent onset of  pain General Strength Comments: 2-/5 bicep/tricep and wrist flex/extension  Care Tool Care Tool Self Care Eating Eating activity did not occur: Safety/medical concerns Eating Assist Level:  (NPO)    Oral Care    Oral Care Assist Level: Dependent - Patient 0%)    Bathing   Body parts bathed by patient: Face Body parts bathed by helper: Left arm;Abdomen;Chest;Right arm;Right upper leg;Left upper leg   Assist Level: 2 Helpers    Upper Body Dressing(including orthotics)   What is the patient wearing?: Pull over shirt   Assist Level: 2 Helpers    Lower Body Dressing (excluding footwear)   What is the patient wearing?: Underwear/pull up;Pants Assist for lower body dressing: 2 Helpers    Putting on/Taking off footwear   What is the patient wearing?: Non-skid slipper socks Assist for footwear: Dependent - Patient 0%       Care Tool Toileting Toileting activity   Assist for toileting: 2 Helpers     Care Tool Bed Mobility Roll left and right activity   Roll left and right assist level: Dependent - Patient 0%    Sit to lying activity   Sit to lying assist level: Dependent - Patient 0%    Lying to sitting on side of bed activity   Lying to sitting on side of bed assist level: the ability to move from lying on the back to sitting on the side of the bed with no back support.: 2 Helpers     Care Tool Transfers Sit to stand transfer   Sit to stand assist level: 2 Helpers    Chair/bed transfer   Chair/bed transfer assist level: 2 Chief Strategy Officer transfer activity did not occur: Safety/medical concerns       Care Tool Cognition  Expression of Ideas and Wants Expression of Ideas and Wants: 1. Rarely/Never expressess or very difficult - rarely/never expresses self or speech is very difficult to understand  Understanding Verbal and Non-Verbal Content Understanding Verbal and Non-Verbal Content: 1. Rarely/never understands   Memory/Recall Ability  Memory/Recall Ability : None of the above were recalled   Refer to Care Plan for Long Term Goals  SHORT TERM GOAL WEEK 1 OT Short Term Goal 1 (Week 1): Pt will follow single step command during ADLs with verbal cues only OT Short Term Goal 2 (Week 1): Pt will locate 5/5  obejcts in L visual field with SUP OT Short Term Goal 3 (Week 1): Pt will maintain sitting balance in preparation for functional transfes with CGA OT Short Term Goal 4 (Week 1): Pt will complete one step of dressing task with SUP  Recommendations for other services: None    Skilled Therapeutic Intervention Skilled Therapeutic Intervention/Progress Updates:  1:1 OT evaluation and intervention initiated with skilled education provided on OT role, goals, and POC. Pt received deeply sleeping in bed requiring significantly increased amount of time to waken and initiate participation in session.  Pt presenting to be lethargic, receptive to skilled OT session and to be in 0/10 pain- OT offering intermittent rest breaks, repositioning, and therapeutic support to optimize participation in therapy session. Pt maintaining eyes closed for majority of session, responding most to his wife calling out his name or providing Pt with simple commands when instructed by OT. Pt was able to follow some single step commands to wash his face, touch his nose, and squeeze OT's hand however he requires increased time for processing and initiation. Vitals assessed in supine BP 121/78(91) HR 78. He was able to complete 2 sit<>stands during session with max Ax2 with min improvement in alertness noted following. [name] wife reporting he is typically more awake in PM vs AM. ADLs completed at levels listed below. Pt would benefit from further OT services in IPR setting to increase independence and decrease bourdon of care. Pt was left resting in bed with call bell in reach, bed alarm on, and all needs met.   ADL ADL Eating: NPO Grooming: Dependent Where  Assessed-Grooming: Bed level Upper Body Bathing: Dependent Where Assessed-Upper Body Bathing: Edge of bed Lower Body Bathing: Dependent Where Assessed-Lower Body Bathing: Edge of bed Upper Body Dressing: Dependent Where Assessed-Upper Body Dressing: Edge of bed Lower Body Dressing: Dependent Where Assessed-Lower Body Dressing: Edge of bed Toileting: Dependent Where Assessed-Toileting: Bed level Toilet Transfer: Not assessed Tub/Shower Transfer: Not assessed Film/video editor: Not assessed Mobility  Bed Mobility Bed Mobility: Rolling Right;Sit to Supine;Supine to Sit Rolling Right: Dependent - Patient equal 0% Supine to Sit: 2 Helpers;Dependent - Patient equal 0% Sit to Supine: Dependent - Patient equal 0%;2 Helpers Transfers Sit to Stand: 2 Helpers;Dependent - Patient 0% Stand to Sit: 2 Helpers;Dependent - Patient equal 0%   Discharge Criteria: Patient will be discharged from OT if patient refuses treatment 3 consecutive times without medical reason, if treatment goals not met, if there is a change in medical status, if patient makes no progress towards goals or if patient is discharged from hospital.  The above assessment, treatment plan, treatment alternatives and goals were discussed and mutually agreed upon: by patient and by family  Katheryn SHAUNNA [place] 01/05/2024, 12:59 PM

## 2024-01-05 NOTE — Progress Notes (Signed)
 Inpatient Rehabilitation Admission Medication Review by a Pharmacist  A complete drug regimen review was completed for this patient to identify any potential clinically significant medication issues.  High Risk Drug Classes Is patient taking? Indication by Medication  Antipsychotic Yes, as an intravenous medication PRN Prochlorperazine  (PO, PR or IV) - nausea  Anticoagulant Yes Enoxaparin  - VTE prophylaxis  Antibiotic No   Opioid No   Antiplatelet Yes Aspirin  81 mg and Clopidogrel  planned for 3 weeks, then Clopidogrel  alone - CVA prophylaxis  Hypoglycemics/insulin No   Vasoactive Medication No   Chemotherapy No   Other Yes Allopurinol  - gout prophylaxis Amantadine  - initiation Bethanechol  - urinary retention Brimondine, Dorzolamide , Latanoprost  - glaucoma Melatonin - sleep Memantine  - dementia Thiamine , Multivitamin - supplements  PRNs: Acetaminophen  - mild pain Maalox - indigestion Diphenhydramine  - itching Guafenesin-dextromethorphan - cough Lidocaine  jelly - in and out caths Bisacodyl , Miralax , Fleets enema - constipation     Type of Medication Issue Identified Description of Issue Recommendation(s)  Drug Interaction(s) (clinically significant)     Duplicate Therapy     Allergy     No Medication Administration End Date  Aspirin  81 mg and Clopidogrel  planned for 3 weeks, then Clopidogrel  alone. Begun 01/01/24, last dose expected 01/21/24.  Incorrect Dose     Additional Drug Therapy Needed     Significant med changes from prior encounter (inform family/care partners about these prior to discharge). New clopidogrel , bethanechol , amantadine .   Off PTA Tamsulosin  and dutaseride due to cannot be crushed or given per tube. Communicate changes with patient/family prior to discharge.   Replaced by bethanechol  while unable to take PO meds whole.  Other  Discharge summary includes plan to resume home galantamine  8 mg daily. Should resume at lower dose if interrupted by > 3  days, and titrate up gradually.   Also to resume Amlodipine .    And to resume colchicine  0.6 mg daily prn gout flare. Dr. Carilyn aware and he will discuss with Neuro before resuming.     Monitoring blood pressure to any need to resume.  Resume at discharge, or during CIR admit if clinically warranted.    Clinically significant medication issues were identified that warrant physician communication and completion of prescribed/recommended actions by midnight of the next day:  No, non-urgent  Name of provider notified for urgent issues identified: Dr. Carilyn  Provider Method of Notification: secure chat  Pharmacist comments:   - Aspirin  and Clopidogrel  planned for 3 weeks, then Clopidogrel  alone.  Begun 01/01/24, last dose expected 01/21/24. - currently off home galantamine  per above.  Time spent performing this drug regimen review (minutes):  [address]   [name], [place] 01/05/2024 11:58 AM

## 2024-01-05 NOTE — Progress Notes (Signed)
 Inpatient Rehabilitation Center Individual Statement of Services  Patient Name:  Dustin Hamilton  Date:  01/05/2024  Welcome to the Inpatient Rehabilitation Center.  Our goal is to provide you with an individualized program based on your diagnosis and situation, designed to meet your specific needs.  With this comprehensive rehabilitation program, you will be expected to participate in at least 3 hours of rehabilitation therapies Monday-Friday, with modified therapy programming on the weekends.  Your rehabilitation program will include the following services:  Physical Therapy (PT), Occupational Therapy (OT), Speech Therapy (ST), 24 hour per day rehabilitation nursing, Care Coordinator, Rehabilitation Medicine, Nutrition Services, and Pharmacy Services  Weekly team conferences will be held on Weddnesday to discuss your progress.  Your Inpatient Rehabilitation Care Coordinator will talk with you frequently to get your input and to update you on team discussions.  Team conferences with you and your family in attendance may also be held.  Expected length of stay: 3-4 weeks  Overall anticipated outcome: min-mod tranfers and max ambulation  Depending on your progress and recovery, your program may change. Your Inpatient Rehabilitation Care Coordinator will coordinate services and will keep you informed of any changes. Your Inpatient Rehabilitation Care Coordinator's name and contact numbers are listed  below.  The following services may also be recommended but are not provided by the Inpatient Rehabilitation Center:   Home Health Rehabiltiation Services Outpatient Rehabilitation Services    Arrangements will be made to provide these services after discharge if needed.  Arrangements include referral to agencies that provide these services.  Your insurance has been verified to be:  [employer] and [employer] Your primary doctor is:  [place]  Pertinent information will be shared with your doctor and your  insurance company.  Inpatient Rehabilitation Care Coordinator:  Dustin Hamilton, KEN [phone] or Dustin Hamilton  Information discussed with and copy given to patient by: Dustin Hamilton, 01/05/2024, 10:17 AM

## 2024-01-05 NOTE — Plan of Care (Signed)
  Problem: RH BOWEL ELIMINATION Goal: RH STG MANAGE BOWEL WITH ASSISTANCE Description: STG Manage Bowel with toileting min Assistance. Outcome: Progressing   Problem: RH BLADDER ELIMINATION Goal: RH STG MANAGE BLADDER WITH ASSISTANCE Description: STG Manage Bladder With min Assistance Outcome: Progressing   Problem: RH SAFETY Goal: RH STG ADHERE TO SAFETY PRECAUTIONS W/ASSISTANCE/DEVICE Description: STG Adhere to Safety Precautions With cues Assistance/Device. Outcome: Progressing   Problem: RH PAIN MANAGEMENT Goal: RH STG PAIN MANAGED AT OR BELOW [name] PAIN GOAL Description: Pain < 4 with prns Outcome: Progressing

## 2024-01-05 NOTE — Progress Notes (Signed)
 Inpatient Rehabilitation  Patient information reviewed and entered into eRehab system by [facility]. Karen Kays., CCC/SLP, PPS Coordinator.  Information including medical coding, functional ability and quality indicators will be reviewed and updated through discharge.

## 2024-01-05 NOTE — Progress Notes (Signed)
 PMR Admission Coordinator Pre-Admission Assessment   Patient: Dustin Hamilton is an 83 y.o., male MRN: [id number] DOB: [birth year] Height: 5' 11 (180.3 cm) Weight: 67.6 kg   Insurance Information HMO:     PPO:      PCP:      IPA:      80/20:      OTHER:  PRIMARY: [facility]      Policy#: [id number]       Subscriber: Pt  CM Name:       Phone#:   Approved on 12/18/23   approved 01/01/24 Benefits:  Phone #: [phone]     Name: [facility] [id number] [name] is approved for 30-days inpatient treatment frin 01/05/24- 02/04/24 Please fax H&P to [phone] or email when available. Please submit progress notes every 2 weeks. If [name] needs greater than 30-days of treatment, a request for additional authorization would need to be sent 2 business days ahead of the expiration. Eff. Date: 06/03/23     Deduct:       Out of Pocket Max:       Life Max:  CIR:       SNF:  Outpatient:      Co-Pay:  Home Health:       Co-Pay:  DME:      Co-Pay:  Providers: in network  SECONDARY: [employer]      Policy#: [id number]      Phone#:    Financial Counselor:       Phone#:    The "Data Collection Information Summary" for patients in Inpatient Rehabilitation Facilities with attached "Privacy Act Statement-Health Care Records" was provided and verbally reviewed with: Pt   Emergency Contact Information Contact Information       Name Relation Home Work Mobile    [place] Spouse [phone]   [phone]         Other Contacts       Name Relation Home Work Mobile    [name],[name] Son     [phone]           Current Medical History  Patient Admitting Diagnosis: CVA History of Present Illness:  Dustin Hamilton is a 83 y.o. male with medical history significant of hypertension, hyperlipidemia, TIA, CVA, amyloid angiopathy, paroxysmal SVT status post ablation, complete heart block status post pacemaker, secondary parkinsonism, dementia, depression, OSA,  gout, BPH, glaucoma, chronic pain who presented to [facility] on 12/30/23 with left-sided deficits as a code stroke.  Vital signs in the ED notable for blood pressure in the 120s-160s systolic.  Lab workup included CMP with potassium 3.3, chloride 117, calcium  8.0, protein 5.6, albumin 2.9.  CBC with hemoglobin 11.8 down from baseline of 15-16, platelets 92.  PT 16.3, INR normal, PTT normal.  Ethanol level negative.  Urinalysis with hemoglobin, protein, small leukocytes, rare bacteria.  CT head showed no acute abnormality. CTA head & neck New focal moderate stenosis of the distal M1 segment of the right MCA. Redemonstrated occlusion of the proximal P2 segment of the left PCA with reconstitution at the mid P2 segment. Focal moderate stenosis of the V4 segment right vertebral artery. Mild atherosclerosis at the right and left carotid bifurcations without hemodynamically significant stenosis. MRI  showed Acute infarct in the right basal ganglia. Remote left PCA territory infarcts and additional small remote infarcts in the right occipital cortex within the right PCA territory. 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 Moderate generalized parenchymal volume  loss.2D Echo with EF 55-60%. Pt. Seen by PT/OT/SLP and they recommend CIR to assist return to PLOF.    Complete NIHSS TOTAL: 13   Patient's medical record from [facility]  has been reviewed by the rehabilitation admission coordinator and physician.   Past Medical History      Past Medical History:  Diagnosis Date   AMS (altered mental status) 03/15/2023   ARF (acute renal failure) (HCC) 09/30/2023   BPH (benign prostatic hyperplasia)     Cerebral infarction (HCC) 09/13/2012    IMO SNOMED Dx Update Oct 2024     Cerebrovascular disease     Clostridium difficile diarrhea     Congenital anomaly of diaphragm     Elevated PSA     Glaucoma, both eyes      Hemorrhoid     Hepatitis B surface antigen positive      02-20-2011   History of adenomatous polyp of colon      2007, 2009 and 2013  tubular adenoma's   History of alcohol  abuse      quit 1963   History of cerebral parenchymal hemorrhage      01/ 2006  left occiptial lobe related to hypertensive crisis   History of CVA (cerebrovascular accident)      09-12-2012  left hippocampus/ amygdala junction and per MRI old white matter infarcts--  per pt residual short- term memory issues   History of fatty infiltration of liver hx visit's at [facility] , last visit 05/ 2014    elvated LFT's ,  via liver bx 2004 related to hx alcohol  and drug abuse (quit 1964)   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 Yes   Family History   family history includes Diabetes in his maternal grandmother and mother; Heart attack in his father and mother; Heart disease in his maternal grandmother; Kidney failure in his mother; Rheum arthritis in his maternal grandmother and mother; Stroke in his maternal grandmother and mother.   Current Medications  Current Medications    Current Facility-Administered Medications:    acetaminophen  (TYLENOL ) tablet 650 mg, 650 mg, Oral, Q6H PRN **OR** acetaminophen  (TYLENOL ) suppository 650 mg, 650 mg, Rectal, Q6H PRN, Melvin, Alexander B, MD   aspirin  chewable tablet 81 mg, 81 mg, Oral, Daily, Melvin, Alexander B, MD   clopidogrel  (PLAVIX ) tablet 75 mg, 75 mg, Oral, Daily, Shafer, Devon, NP   dutasteride  (AVODART ) capsule 0.5 mg, 0.5 mg, Oral, Daily, Melvin, Alexander B, MD   magnesium  oxide  (MAG-OX) tablet 800 mg, 800 mg, Oral, Once, Kommor, Madison, MD   melatonin tablet 3 mg, 3 mg, Oral, QHS, Melvin, Alexander B, MD   memantine  (NAMENDA ) tablet 10 mg, 10 mg, Oral, BID, Melvin, Alexander B, MD   polyethylene glycol (MIRALAX  / GLYCOLAX ) packet 17 g, 17 g, Oral, Daily PRN, Melvin, Alexander B, MD   potassium chloride  SA (KLOR-CON  M) CR tablet 40 mEq, 40 mEq, Oral,  Once, Kommor, Madison, MD   sodium chloride  flush (NS) 0.9 % injection 3 mL, 3 mL, Intravenous, Q12H, Melvin, Alexander B, MD, 3 mL at 12/31/23 2223     Patients Current Diet:  Diet Order                  Diet NPO time specified  Diet effective now                         Precautions / Restrictions Precautions Precautions: Fall Restrictions Weight Bearing Restrictions Per Provider Order: No    Has the patient had 2 or more falls or a fall with injury in the past year? Yes   Prior Activity Level Community (5-7x/wk): Pt. active in the communtiy   Prior Functional Level Self Care: Did the patient need help bathing, dressing, using the toilet or eating? Needed some help   Indoor Mobility: Did the patient need assistance with walking from room to room (with or without device)? Independent   Stairs: Did the patient need assistance with internal or external stairs (with or without device)? Dependent   Functional Cognition: Did the patient need help planning regular tasks such as shopping or remembering to take medications? Dependent   Patient Information Are you of Hispanic, Latino/a,or Spanish origin?: A. No, not of Hispanic, Latino/a, or Spanish origin What is your race?: B. Black or African American Do you need or want an interpreter to communicate with a doctor or health care staff?: 0. No Patient information obtained via proxy : wife   Patient's Response To:  Health Literacy and Transportation Is the patient able to respond to health literacy and transportation needs?: No Health Literacy - How  often do you need to have someone help you when you read instructions, pamphlets, or other written material from your doctor or pharmacy?: Patient unable to respond In the past 12 months, has lack of transportation kept you from medical appointments or from getting medications?: No In the past 12 months, has lack of transportation kept you from meetings, work, or from getting things needed for daily living?: No Health Literacy and Transportation obtained via proxy: wife   Journalist, newspaper / Equipment Home Equipment: Grab bars - toilet, Grab bars - tub/shower, BSC/3in1, Hand held shower head, Cane - single point, Rollator (4 wheels)   Prior Device Use: Indicate devices/aids used by the patient prior to current illness, exacerbation or injury? Walker   Current Functional Level Cognition   Arousal/Alertness: Awake/alert Overall Cognitive Status: Impaired/Different from baseline Orientation Level: Oriented to person, Oriented to place, Disoriented to time, Disoriented to situation Attention: Sustained Sustained Attention: Impaired Sustained Attention Impairment: Functional basic, Verbal basic Problem Solving: Impaired Problem Solving Impairment: Functional basic    Extremity Assessment (includes Sensation/Coordination)   Upper Extremity Assessment: Defer to OT evaluation RUE Deficits / Details: limited mobility, fair grasp able to maintain on stedy. Elbow flexion/ext limited with shoulder movement very slight ROM ~10 deg. LUE Deficits / Details: very limited grasp with LUE, weaker than R and inconsistent. trace elbow flexion no elbow ext, strong flexor synergy pattern. Able to retract scapula, scapula tight with notable protracted position. 3/4 elbow flex tone LUE Sensation: WNL (per report) LUE Coordination: decreased fine motor, decreased gross motor  Lower Extremity Assessment: RLE deficits/detail, LLE deficits/detail RLE Deficits / Details: At least 3/5, unable to formally MMT due  to cognition LLE Deficits / Details: Able to wiggle toes and contract L quad, WFL PROM  ADLs   Grooming: Maximal assistance, Sitting Grooming Details (indicate cue type and reason): max A for thoroughness, initiated with hand over hand assist and placement of cloth in hand. Able to bring washcloth to face with light AAROM using RUE. General ADL Comments: Total A for all other ADLs.     Mobility   Overal bed mobility: Needs Assistance Bed Mobility: Supine to Sit Supine to sit: Total assist, +2 for safety/equipment, +2 for physical assistance, Used rails General bed mobility comments: cues and no initiation of bed mobility.     Transfers   Overall transfer level: Needs assistance Equipment used: 2 person hand held assist, Ambulation equipment used Transfers: Sit to/from Stand, Bed to chair/wheelchair/BSC Sit to Stand: Max assist, +2 physical assistance, +2 safety/equipment Bed to/from chair/wheelchair/BSC transfer type:: Via Lift equipment Stand pivot transfers: Max assist, +2 physical assistance, +2 safety/equipment Transfer via Lift Equipment: Stedy General transfer comment: able to stand with MaxAx2 and [facility] with difficulty maintaining upright posture. Transfer to recliner with Stedy, assist needed to bring hands to stedy handle. Able to slightly pull into standing with MaxAx2 to complete. Slight left lateral lean     Ambulation / Gait / Stairs / Wheelchair Mobility   Ambulation/Gait General Gait Details: unable this date     Posture / Balance Dynamic Sitting Balance Sitting balance - Comments: at some periods CGA, began leaning L and anteriorly with fatigue Balance Overall balance assessment: Needs assistance Sitting-balance support: Feet supported Sitting balance-Leahy Scale: Poor Sitting balance - Comments: at some periods CGA, began leaning L and anteriorly with fatigue Postural control: Left lateral lean Standing balance-Leahy Scale: Zero Standing balance comment: reliant  on ext assist.     Special considerations/life events  Skin intact and Special service needs none    Previous Home Environment (from acute therapy documentation) Living Arrangements: Spouse/significant other Available Help at Discharge: Family, Available 24 hours/day Type of Home: House Home Layout: One level Home Access: Ramped entrance Bathroom Shower/Tub: Health visitor: Standard Bathroom Accessibility: Yes Home Care Services: Yes Type of Home Care Services: Homehealth aide Additional Comments: Aide x3 hrs 4day/wk. Pt with limited communication, PLOF and setup from past notes   Discharge Living Setting Plans for Discharge Living Setting: House Type of Home at Discharge: House Discharge Home Layout: One level Discharge Home Access: Ramped entrance Discharge Bathroom Shower/Tub: Walk-in shower Discharge Bathroom Toilet: Standard Discharge Bathroom Accessibility: Yes How Accessible: Accessible via walker Does the patient have any problems obtaining your medications?: No   Social/Family/Support Systems Patient Roles: Spouse Contact Information: [phone] Anticipated Caregiver: Wife can do supervision, Neice and nephew can assist, has aide 3 hrs 4 days a week Caregiver Availability: 24/7 Discharge Plan Discussed with Primary Caregiver: Yes Is Caregiver In Agreement with Plan?: No Does Caregiver/Family have Issues with Lodging/Transportation while Pt is in Rehab?: No   Goals Patient/Family Goal for Rehab: PT/OT/SLP Min-mod A Expected length of stay: 18-21 days Pt/Family Agrees to Admission and willing to participate: Yes Program Orientation Provided & Reviewed with Pt/Caregiver Including Roles  & Responsibilities: Yes   Decrease burden of Care through IP rehab admission: Not anticipated   Possible need for SNF placement upon discharge: Not anticipated   Patient Condition: I have reviewed medical records from [facility] , spoken with CM,  and patient, spouse, and family member. I met with patient at the bedside for inpatient rehabilitation assessment.  Patient will benefit from ongoing PT, OT, and SLP, can actively participate in 3 hours  of therapy a day 5 days of the week, and can make measurable gains during the admission.  Patient will also benefit from the coordinated team approach during an Inpatient Acute Rehabilitation admission.  The patient will receive intensive therapy as well as Rehabilitation physician, nursing, social worker, and care management interventions.  Due to safety, skin/wound care, disease management, medication administration, pain management, and patient education the patient requires 24 hour a day rehabilitation nursing.  The patient is currently Max A of 2  with mobility and basic ADLs.  Discharge setting and therapy post discharge at home with home health is anticipated.  Patient has agreed to participate in the Acute Inpatient Rehabilitation Program and will admit today.   Preadmission Screen Completed By:  Leita KATHEE Kleine, 01/01/2024 1:22 PM ______________________________________________________________________   Discussed status with Dr. Carilyn  on 01/04/24 at 1036 and received approval for admission today.   Admission Coordinator:  Leita KATHEE Kleine, CCC-SLP, time 1036/Date 01/04/24

## 2024-01-06 DIAGNOSIS — R1312 Dysphagia, oropharyngeal phase: Secondary | ICD-10-CM | POA: Diagnosis not present

## 2024-01-06 DIAGNOSIS — I6381 Other cerebral infarction due to occlusion or stenosis of small artery: Secondary | ICD-10-CM | POA: Diagnosis not present

## 2024-01-06 DIAGNOSIS — F01B0 Vascular dementia, moderate, without behavioral disturbance, psychotic disturbance, mood disturbance, and anxiety: Secondary | ICD-10-CM | POA: Diagnosis not present

## 2024-01-06 LAB — GLUCOSE, CAPILLARY
Glucose-Capillary: 150 mg/dL — ABNORMAL HIGH (ref 70–99)
Glucose-Capillary: 162 mg/dL — ABNORMAL HIGH (ref 70–99)
Glucose-Capillary: 136 mg/dL — ABNORMAL HIGH (ref 70–99)
Glucose-Capillary: 97 mg/dL (ref 70–99)
Glucose-Capillary: 161 mg/dL — ABNORMAL HIGH (ref 70–99)

## 2024-01-06 LAB — URINALYSIS, ROUTINE W REFLEX MICROSCOPIC
Bilirubin Urine: NEGATIVE
Glucose, UA: NEGATIVE mg/dL
Ketones, ur: NEGATIVE mg/dL
Nitrite: NEGATIVE
Protein, ur: 100 mg/dL — AB
Specific Gravity, Urine: 1.017 (ref 1.005–1.030)
pH: 8 (ref 5.0–8.0)

## 2024-01-06 MED ORDER — CARMEX CLASSIC LIP BALM EX OINT: TOPICAL_OINTMENT | CUTANEOUS | Status: DC | PRN

## 2024-01-06 NOTE — Progress Notes (Signed)
 PROGRESS NOTE   Subjective/Complaints:  Remains lethargic , reviewed meds and UA Received symmetrel  ~49min prior to MD visit  ROS- limited by cognition   Objective:   No results found. Recent Labs    01/05/24 0526  WBC 4.6  HGB 15.1  HCT 44.6  PLT 113*   Recent Labs    01/05/24 0526  NA 144  K 3.9  CL 110  CO2 28  GLUCOSE 162*  BUN 29*  CREATININE 0.89  CALCIUM  9.8    Intake/Output Summary (Last 24 hours) at 01/06/2024 1051 Last data filed at 01/06/2024 0643 Gross per 24 hour  Intake 600 ml  Output 450 ml  Net 150 ml        Physical Exam: Vital Signs Blood pressure 120/65, pulse 75, temperature 98.6 F (37 C), temperature source Axillary, resp. rate 16, height 5' 11 (1.803 m), weight 63 kg, SpO2 95%. ENT- ortrak in nares  Masked facies  General: No acute distress Mood and affect flat Heart: Regular rate and rhythm no rubs murmurs or extra sounds Lungs: Clear to auscultation, breathing unlabored, no rales or wheezes Abdomen: Positive bowel sounds, soft nontender to palpation, nondistended Extremities: No clubbing, cyanosis, or edema Skin: No evidence of breakdown, no evidence of rash Neurologic: very flat eyes open, minimal movement spontaneously on RIght side , stares straight ahead not tuning head. motor strength is 4/5 in right deltoid, bicep, tricep, grip, hip flexor, knee extensors, ankle dorsiflexor and plantar flexor, 0/5 LUE and LLE Sensory exam limited by attention and concentration  Cerebellar exam could not cooperate Musculoskeletal: Full range of motion in all 4 extremities. No joint swelling Assessment/Plan: 1. Functional deficits which require 3+ hours per day of interdisciplinary therapy in a comprehensive inpatient rehab setting. Physiatrist is providing close team supervision and 24 hour management of active medical problems listed below. Physiatrist and rehab team continue to assess  barriers to discharge/monitor patient progress toward functional and medical goals  Care Tool:  Bathing    Body parts bathed by patient: Face   Body parts bathed by helper: Left arm, Abdomen, Chest, Right arm, Right upper leg, Left upper leg     Bathing assist Assist Level: 2 Helpers     Upper Body Dressing/Undressing Upper body dressing   What is the patient wearing?: Pull over shirt    Upper body assist Assist Level: 2 Helpers    Lower Body Dressing/Undressing Lower body dressing      What is the patient wearing?: Underwear/pull up, Pants     Lower body assist Assist for lower body dressing: 2 Helpers     Toileting Toileting    Toileting assist Assist for toileting: 2 Helpers     Transfers Chair/bed transfer  Transfers assist  Chair/bed transfer activity did not occur: Safety/medical concerns  Chair/bed transfer assist level: 2 Helpers     Locomotion Ambulation   Ambulation assist   Ambulation activity did not occur: Safety/medical concerns          Walk 10 feet activity   Assist  Walk 10 feet activity did not occur: Safety/medical concerns        Walk 50 feet activity   Assist  Walk 50 feet with 2 turns activity did not occur: Safety/medical concerns         Walk 150 feet activity   Assist Walk 150 feet activity did not occur: Safety/medical concerns         Walk 10 feet on uneven surface  activity   Assist Walk 10 feet on uneven surfaces activity did not occur: Safety/medical concerns         Wheelchair     Assist Is the patient using a wheelchair?: Yes (did not use during eval but is expected to use throughout stay and will use on d/c) Type of Wheelchair: Manual Wheelchair activity did not occur: Safety/medical concerns         Wheelchair 50 feet with 2 turns activity    Assist    Wheelchair 50 feet with 2 turns activity did not occur: Safety/medical concerns       Wheelchair 150 feet activity      Assist  Wheelchair 150 feet activity did not occur: Safety/medical concerns       Blood pressure 120/65, pulse 75, temperature 98.6 F (37 C), temperature source Axillary, resp. rate 16, height 5' 11 (1.803 m), weight 63 kg, SpO2 95%.  Medical Problem List and Plan: 1. 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  Antithrombotics: -DVT/anticoagulation:  Pharmaceutical: Lovenox              -antiplatelet therapy: ASA/Plavix  X 3 weeks followed by Plavix  alone 3. Pain Management: Tylenol  prn.  4. Mood/Behavior/Sleep: LCSW to follow for evaluation and support.             --hx of sundowning-->continue melatonin 3 mg/Monitor sleep hygeine             -antipsychotic agents: N/A  Reduced level of alertness trial amantadine  5. Neuropsych/cognition: This patient is not capable of making decisions on fully own behalf. 6. Skin/Wound Care:  Routine pressure relief measures.  7. Fluids/Electrolytes/Nutrition: Continue NPO due to holding of orals.              --on tube feeds.  8. HTN: Monitor BP TID--goal 150 to avoid symptoms (multiple ED visits this year)             --hx of chronic dizziness. Needs to stay hydrated. 9. H/o SVT/CHF s/p PPM/syncope: Monitor for orthostatic symptoms  10. BPH/urinary retention: Urecholine  and Proscar  cannot be crushed per pharmacy so will hold (cannot use Avodart  capsule due to TF).  Will check PVRs 11. Dementia: On Namenda ,  12. Glaucoma: Resume home meds. 13. Dysphagia: NPO with tube feeds. May need PEG for nutritional support.  14. Acute renal failure: Improved rom 24/1.36 to 27/0.99 15. chronic  Thrombocytopenia:stable      Latest Ref Rng & Units 01/05/2024    5:26 AM 01/03/2024    3:29 AM 01/02/2024    5:10 AM  CBC  WBC 4.0 - 10.5 K/uL 4.6  6.7  7.3   Hemoglobin 13.0 - 17.0 g/dL 84.8  84.9  81.4   Hematocrit 39.0 - 52.0 % 44.6  44.6  54.5   Platelets 150 - 400 K/uL 113  124  113      16.  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  UA + WBC but only rare bact, treat empirically x 3 d to see if this improves MS  LOS: 2 days A FACE TO FACE EVALUATION WAS PERFORMED  Dustin Hamilton 01/06/2024, 10:51 AM

## 2024-01-06 NOTE — Plan of Care (Signed)
  Problem: RH BOWEL ELIMINATION Goal: RH STG MANAGE BOWEL WITH ASSISTANCE Description: STG Manage Bowel with toileting min Assistance. Outcome: Progressing   Problem: RH BLADDER ELIMINATION Goal: RH STG MANAGE BLADDER WITH ASSISTANCE Description: STG Manage Bladder With min Assistance Outcome: Progressing   Problem: RH SAFETY Goal: RH STG ADHERE TO SAFETY PRECAUTIONS W/ASSISTANCE/DEVICE Description: STG Adhere to Safety Precautions With cues Assistance/Device. Outcome: Progressing

## 2024-01-06 NOTE — Progress Notes (Signed)
 Occupational Therapy Session Note  Patient Details  Name: Dustin Hamilton MRN: [id number] Date of Birth: [birth year]  Today's Date: 01/06/2024 OT Individual Missed Time:  30 min (fatigue/lethargy)     Short Term Goals: Week 1:  OT Short Term Goal 1 (Week 1): Pt will follow single step command during ADLs with verbal cues only OT Short Term Goal 2 (Week 1): Pt will locate 5/5 obejcts in L visual field with SUP OT Short Term Goal 3 (Week 1): Pt will maintain sitting balance in preparation for functional transfes with CGA OT Short Term Goal 4 (Week 1): Pt will complete one step of dressing task with SUP  Skilled Therapeutic Interventions/Progress Updates:     Pt received deeply sleeping sitting upright in bed. Turned on lights, applied cold wash cloth to [name] face, completed sternal rub, and called out [name] name, however Pt not waking sufficiently enough to participate in skilled OT session. Pt was able to follow command to squeeze OT's hand x1 trial and complete small movements to nod head yes/no, however Pt did not open his eyes or verbally respond to therapist. Pt too fatigued to participate in session at this time. Missed 30 minutes skilled OT treatment, will attempt to make up time as [name] status and schedule allow. Pt was left resting in bed with call bell in reach, bed alarm on, and all needs met.    Therapy Documentation Precautions:  Precautions Precautions: Fall Recall of Precautions/Restrictions: Impaired Precaution/Restrictions Comments: L hemipareisis, NPO, CorTrak Restrictions Weight Bearing Restrictions Per Provider Order: No   Therapy/Group: Individual Therapy  Katheryn SHAUNNA Mines 01/06/2024, 7:54 AM

## 2024-01-06 NOTE — Progress Notes (Signed)
 Speech Language Pathology Daily Session Note  Patient Details  Name: Dustin Hamilton MRN: [id number] Date of Birth: February 04, 1941  Today's Date: 01/06/2024 SLP Individual Time: 1400-1430 SLP Individual Time Calculation (min): 30 min  Short Term Goals: Week 1: SLP Short Term Goal 1 (Week 1): Patient will demonstrate readiness for instrumental swallow evaluation SLP Short Term Goal 2 (Week 1): Patient will answer simple yes/no questions with 50% accuracy given max multimodal A SLP Short Term Goal 3 (Week 1): Patient will utilize gestures/verbalizations to communicate basic wants/needs with max multimodal A SLP Short Term Goal 4 (Week 1): Patient will sustain attention to functional tasks for 1 minute given max multimodal A  Skilled Therapeutic Interventions: Skilled therapy session focused on communication goals. Patient extremely lethargic requiring cold compress and oral care to awaken. Patient with blood in oral cavity from unknown source, though seemingly not actively bleeding. After oral care, patient opened eyes and verbalized name given modA. SLP continued to target verbalizations through automatic speech tasks. Patient required maxA for initiation of numbers, days of the week and months of the year. Patient would often complete half of the sequence before falling back asleep. SLP attempted to target confrontational naming, though patient ceased participation and with no further verbalizations despite total A. Patient left in bed with alarm set and call bell in reach. Wife present. Continue POC  30 minutes of session missed due to lethargy. SLP will attempt to return as patient / therapist schedule allows  Pain None verbalized/visualized  Therapy/Group: Individual Therapy  Saquoia Sianez M.A., CCC-SLP 01/06/2024, 7:49 AM

## 2024-01-06 NOTE — Progress Notes (Signed)
 Patient ID: Dustin Hamilton, male   DOB: Oct 02, 1940, 83 y.o.   MRN: [id number] Met with wife who was coming to see pt to update regarding team conference and the need to get him awake and able to participate in therapies to better be able to evaluate and set goals. Made aware he will require 24/7 physical care at discharge and she will need to hire or arrange others to assist her due to her own health issues. Wife continues to want to take pt home and not have to go to a SNF again. Will find out if is service connected and will have VA coverage in SNF. Continue to work on discharge needs.

## 2024-01-06 NOTE — Plan of Care (Signed)
  Problem: RH Balance Goal: LTG Patient will maintain dynamic sitting balance (PT) Description: LTG:  Patient will maintain dynamic sitting balance with assistance during mobility activities (PT) Flowsheets (Taken 01/06/2024 0558) LTG: Pt will maintain dynamic sitting balance during mobility activities with:: Contact Guard/Touching assist Goal: LTG Patient will maintain dynamic standing balance (PT) Description: LTG:  Patient will maintain dynamic standing balance with assistance during mobility activities (PT) Flowsheets (Taken 01/06/2024 0558) LTG: Pt will maintain dynamic standing balance during mobility activities with:: Moderate Assistance - Patient 50 - 74%   Problem: Sit to Stand Goal: LTG:  Patient will perform sit to stand with assistance level (PT) Description: LTG:  Patient will perform sit to stand with assistance level (PT) Flowsheets (Taken 01/06/2024 0558) LTG: PT will perform sit to stand in preparation for functional mobility with assistance level: Moderate Assistance - Patient 50 - 74%   Problem: RH Bed Mobility Goal: LTG Patient will perform bed mobility with assist (PT) Description: LTG: Patient will perform bed mobility with assistance, with/without cues (PT). Flowsheets (Taken 01/06/2024 0558) LTG: Pt will perform bed mobility with assistance level of: Moderate Assistance - Patient 50 - 74%   Problem: RH Bed to Chair Transfers Goal: LTG Patient will perform bed/chair transfers w/assist (PT) Description: LTG: Patient will perform bed to chair transfers with assistance (PT). Flowsheets (Taken 01/06/2024 0558) LTG: Pt will perform Bed to Chair Transfers with assistance level: Moderate Assistance - Patient 50 - 74%   Problem: RH Ambulation Goal: LTG Patient will ambulate in controlled environment (PT) Description: LTG: Patient will ambulate in a controlled environment, # of feet with assistance (PT). Flowsheets (Taken 01/06/2024 0558) LTG: Pt will ambulate in controlled environ   assist needed:: Maximal Assistance - Patient 25 - 49% LTG: Ambulation distance in controlled environment: 30 ft using LRAD   Problem: RH Wheelchair Mobility Goal: LTG Patient will propel w/c in controlled environment (PT) Description: LTG: Patient will propel wheelchair in controlled environment, # of feet with assist (PT) Flowsheets (Taken 01/06/2024 0558) LTG: Pt will propel w/c in controlled environ  assist needed:: Minimal Assistance - Patient > 75% LTG: Propel w/c distance in controlled environment: 50 ft

## 2024-01-06 NOTE — Progress Notes (Signed)
 Occupational Therapy Note  Patient Details  Name: Dustin Hamilton MRN: [id number] Date of Birth: January 13, 1941  Therapy team has recommended 15/7 schedule change d/t recent limited participation 2/2 to lethargy. Will revisit change back to normal schedule (5 out of 7) with improvement in alertness levels, functional cognition, and activity tolerance.    Katheryn SQUIBB Woodson 01/06/2024, 11:09 AM

## 2024-01-07 LAB — BASIC METABOLIC PANEL WITH GFR
Anion gap: 7 (ref 5–15)
BUN: 30 mg/dL — ABNORMAL HIGH (ref 8–23)
CO2: 27 mmol/L (ref 22–32)
Calcium: 9.9 mg/dL (ref 8.9–10.3)
Chloride: 106 mmol/L (ref 98–111)
Creatinine, Ser: 0.89 mg/dL (ref 0.61–1.24)
GFR, Estimated: >60 mL/min (ref >60)
Glucose, Bld: 199 mg/dL — ABNORMAL HIGH (ref 70–99)
Potassium: 4.7 mmol/L (ref 3.5–5.1)
Sodium: 140 mmol/L (ref 135–145)

## 2024-01-07 LAB — CBC
HCT: 46.4 % (ref 39.0–52.0)
Hemoglobin: 15.4 g/dL (ref 13.0–17.0)
MCH: 31.8 pg (ref 26.0–34.0)
MCHC: 33.2 g/dL (ref 30.0–36.0)
MCV: 95.7 fL (ref 80.0–100.0)
Platelets: 143 K/uL — ABNORMAL LOW (ref 150–400)
RBC: 4.85 MIL/uL (ref 4.22–5.81)
RDW: 13.5 % (ref 11.5–15.5)
WBC: 8.3 K/uL (ref 4.0–10.5)
nRBC: 0 % (ref 0.0–0.2)

## 2024-01-07 LAB — GLUCOSE, CAPILLARY
Glucose-Capillary: 139 mg/dL — ABNORMAL HIGH (ref 70–99)
Glucose-Capillary: 139 mg/dL — ABNORMAL HIGH (ref 70–99)
Glucose-Capillary: 161 mg/dL — ABNORMAL HIGH (ref 70–99)
Glucose-Capillary: 176 mg/dL — ABNORMAL HIGH (ref 70–99)
Glucose-Capillary: 187 mg/dL — ABNORMAL HIGH (ref 70–99)
Glucose-Capillary: 202 mg/dL — ABNORMAL HIGH (ref 70–99)

## 2024-01-07 MED ORDER — CEPHALEXIN 250 MG/5ML PO SUSR
500.0000 mg | Freq: Two times a day (BID) | ORAL | Status: AC
  Administered 2024-01-07 – 2024-01-09 (×6): 500 mg
  Filled 2024-01-07 (×6): qty 10

## 2024-01-07 NOTE — IPOC Note (Signed)
 Overall Plan of Care [facility]) Patient Details Name: Dustin Hamilton MRN: [id number] DOB: 1940-07-27  Admitting Diagnosis: Stroke of right basal ganglia [facility])  Hospital Problems: Principal Problem:   Stroke of right basal ganglia (HCC)     Functional Problem List: Nursing Bowel, Bladder, Endurance, Medication Management, Pain, Safety  PT Balance, Endurance, Motor, Nutrition, Perception, Safety, Sensory, Other (comment) (alertness)  OT Balance, Endurance, Perception, Vision, Motor, Safety, Cognition, Sensory, Pain, Skin Integrity  SLP Cognition, Linguistic, Nutrition  TR         Basic ADL's: OT Dressing, Bathing, Grooming     Advanced  ADL's: OT None     Transfers: PT Bed Mobility, Bed to Chair, Car, Occupational psychologist, Research scientist (life sciences): PT Ambulation, Psychologist, prison and probation services     Additional Impairments: OT Fuctional Use of Upper Extremity  SLP Swallowing, Social Cognition, Communication comprehension, expression Restaurant manager, fast food, Attention, Problem Solving, Memory  TR      Anticipated Outcomes Item Anticipated Outcome  Self Feeding SUP  Swallowing  mod   Basic self-care  MOD A  Toileting  MOD A   Bathroom Transfers MOD A  Bowel/Bladder  manage bowel and bladder w mod I assist  Transfers  ModA  Locomotion  MaxA for ambulation; ModA for w/c mobilty  Communication  mod  Cognition  mod  Pain  Pain < 4 with prns  Safety/Judgment  manage safety w cues   Therapy Plan: PT Intensity: Minimum of 1-2 x/day ,45 to 90 minutes PT Frequency: 5 out of 7 days PT Duration Estimated Length of Stay: 3-4 weeks OT Intensity: Minimum of 1-2 x/day, 45 to 90 minutes OT Frequency: 5 out of 7 days OT Duration/Estimated Length of Stay: 4 weeks SLP Intensity: Minumum of 1-2 x/day, 30 to 90 minutes SLP Frequency: 3 to 5 out of 7 days SLP Duration/Estimated Length of Stay: 4 weeks   Team Interventions: Nursing Interventions Patient/Family Education, Medication  Management, Bowel Management, Bladder Management, Disease Management/Prevention, Discharge Planning, Dysphagia/Aspiration Precaution Training  PT interventions Ambulation/gait training, DME/adaptive equipment instruction, Neuromuscular re-education, Psychosocial support, Stair training, UE/LE Strength taining/ROM, Wheelchair propulsion/positioning, Warden/ranger, Discharge planning, Functional electrical stimulation, Therapeutic Activities, UE/LE Coordination activities, Visual/perceptual remediation/compensation, Therapeutic Exercise, Splinting/orthotics, Patient/family education, Functional mobility training, Disease management/prevention, Cognitive remediation/compensation  OT Interventions Functional electrical stimulation, Warden/ranger, Cognitive remediation/compensation, Community reintegration, Discharge planning, Disease mangement/prevention, DME/adaptive equipment instruction, Functional mobility training, Neuromuscular re-education, Patient/family education, Pain management, Psychosocial support, Self Care/advanced ADL retraining, Skin care/wound managment, Therapeutic Activities, Splinting/orthotics, Therapeutic Exercise, UE/LE Strength taining/ROM, UE/LE Coordination activities, Visual/perceptual remediation/compensation, Wheelchair propulsion/positioning  SLP Interventions Cognitive remediation/compensation, Cueing hierarchy, Dysphagia/aspiration precaution training, Functional tasks, Internal/external aids, Multimodal communication approach, Oral motor exercises, Patient/family education, Speech/Language facilitation, Therapeutic Activities  TR Interventions    SW/CM Interventions Discharge Planning, Psychosocial Support, Patient/Family Education   Barriers to Discharge MD  Medical stability, Lack of/limited family support, and Nutritional means  Nursing Decreased caregiver support, Home environment access/layout, Nutrition means 1 level ramped entry w spouse; has  HH Aide x3 hrs 4day/wk; niece/nephew to assist with care; has DME  PT Incontinence, Nutrition means, Other (comments) (inability to ambulate, significant lethargy)    OT Decreased caregiver support    SLP      SW Decreased caregiver support, Other (comments)     Team Discharge Planning: Destination: PT-Skilled Nursing Facility (SNF) (pt's progress will determine home vs SNF) ,OT- Home , SLP-Home Projected Follow-up: PT-Home health PT, Skilled nursing facility, 24 hour supervision/assistance (pt's progress will  determine home vs SNF), OT-  Home health OT, SLP-Home Health SLP, Outpatient SLP, 24 hour supervision/assistance Projected Equipment Needs: PT-To be determined, OT- To be determined, SLP-None recommended by SLP Equipment Details: PT- , OT-  Patient/family involved in discharge planning: PT- Patient, Family member/caregiver,  OT-Family member/caregiver, SLP-Family member/caregiver  MD ELOS: 21-24d  Medical Rehab Prognosis:  Guarded Assessment: The patient has been admitted for CIR therapies with the diagnosis of RIght basal ganglia stroke . The team will be addressing functional mobility, strength, stamina, balance, safety, adaptive techniques and equipment, self-care, bowel and bladder mgt, patient and caregiver education, likely PEG, trial neurostimulants. Goals have been set at Mod A. Anticipated discharge destination is SNF.        See Team Conference Notes for weekly updates to the plan of care

## 2024-01-07 NOTE — Progress Notes (Signed)
 Physical Therapy Session Note  Patient Details  Name: Dustin Hamilton MRN: [id number] Date of Birth: [birth year]  Today's Date: 01/06/2024 PT Individual Time: 8696-8650 PT Individual Time Calculation (min): 46 min And Today's Date: 01/06/2024 PT Missed Time: 14 minutes Missed Time Reason: Patient fatigue   Short Term Goals: Week 1:  PT Short Term Goal 1 (Week 1): Pt will perform bed rolling with MaxA +1. PT Short Term Goal 2 (Week 1): Pt will perform supine<>sit with MaxA +1. PT Short Term Goal 3 (Week 1): Pt will perform sit<>stand with MaxA +2. PT Short Term Goal 4 (Week 1): Pt will maintain unsupported seated balance with MinA for more than 3 min. PT Short Term Goal 5 (Week 1): Pt will demonstrate improved initiation by following more than 50% of verbal instructions for mobility.  Skilled Therapeutic Interventions/Progress Updates:  Patient supine in bed on entrance to room. Patient alert and agreeable to PT session. Wife present and playing pt's favorite music - noted R forefinger tapping. Requested pt to sing along with music but only received focus of eye   Patient with no pain complaint at start of session.   Therapeutic Activity: Bed Mobility: Pt performed supine > sit with MaxA from therapist and CGA/ MinA from +2. Challenged with attempt to initiate BLE off EOB. Initiates RLE but unable to complete. Provided with vc/ tc but requires MaxA to bring BLE off EOB. Requires ModA +2 to scoot to EOB. No initiation with simple vc. Provided cueing throughout for technique and effort. Overall mildly improved lethargy from eval but  continued to demo wax/ wane of alertness. When seated EOB, attempt to guide pt in reaching activity to [name] hands as targets. Demos initiation with RUE but does not reach all the way to target. Maintains balance with light CGA at best and ModA to correct LOB with weight shift/ lean to L side. However, pt does demo significant lean to L with no LOB unless challenged  further.  Transfers: Pt agreeable to attempt standing and provided with 3 musketeer support to maintain stance with MaxA +2. Provided with guard to L knee initially and then pt able to hold in flexed position with MaxA +1 briefly. Then provided with facilitation in lateral weight shifting to music provided by wife. Pt able to maintain stance for .    NMR performed for improvements in motor control and coordination, balance, sequencing, judgement, and self confidence/ efficacy in performing all aspects of mobility at highest level of independence.   [name] brief soiled and initiated change and pericare to protect skin integrity and prevent infection. Required MaxA +2 to complete roll to each side. TotA for pericare, and brief change.   Patient supine in bed at end of session with brakes locked, bed alarm set, and all needs within reach.   Therapy Documentation Precautions:  Precautions Precautions: Fall Recall of Precautions/Restrictions: Impaired Precaution/Restrictions Comments: L hemipareisis, NPO, CorTrak Restrictions Weight Bearing Restrictions Per Provider Order: No  Pain:  No pain demonstrated by pt throughout session.    Therapy/Group: Individual Therapy  Mliss DELENA Milliner PT, DPT, CSRS 01/06/2024, 6:14 PM

## 2024-01-07 NOTE — Patient Care Conference (Signed)
 Inpatient RehabilitationTeam Conference and Plan of Care Update Date: 01/06/2024   Time: 10:48 AM    Patient Name: Dustin Hamilton      Medical Record Number: [id number]  Date of Birth: December 22, 1940 Sex: Male         Room/Bed: 5T95R/5T95R-98 Payor Info: Payor: [employer] / Plan: [facility] [employer] / Product Type: *No Product type* /    Admit Date/Time:  01/04/2024  3:04 PM  Primary Diagnosis:  Stroke of right basal ganglia [facility])  Hospital Problems: Principal Problem:   Stroke of right basal ganglia [facility])    Expected Discharge Date: Expected Discharge Date:  (LOS pending)  Team Members Present: Physician leading conference: Dr. Prentice Compton Social Worker Present: Rhoda Clement, LCSW Nurse Present: Barnie Ronde, RN PT Present: Recardo Milliner, PT OT Present: Katheryn Mines, OT SLP Present: Blaise Alderman, SLP     Current Status/Progress Goal Weekly Team Focus  Bowel/Bladder   Incontinent B/B LBM 01/03/24   Will regain B/B continence   Assist with time toileting q4hrs qshift    Swallow/Nutrition/ Hydration   NPO   mod  PO trials, EMST, pharyngeal strengthening, MBS readiness    ADL's   total A for all ADLs with +2 required for transfers and sitting EOB; very lethargic   mod A   evaluation, family education, following single step commands, functional activities    Mobility   Max/ TotA for all mobility   ModA overall  Barriers: lethargy, hemiplegia /// Work on: all mobility LOA, alertness, seated balance, NMR, family education    Communication   severe expressive/recepetive   mod   multimodal means of communication, yes/no, single step commands    Safety/Cognition/ Behavioral Observations  severe attention deficits   mod   attention ot task, orientation    Pain   No endorsement of pain at this time   Will be free from pain   Assess pain qshift/prn    Skin   Skin is intact   Will maintain skin intergrity without breakdown  Assess  skin qshift/prn and repositioned to protect skin intergrity      Discharge Planning:  Wife wants to take pt home but has health issues of her own and can not provide much assist. Pt was at [facility] last year due to care needs. Pt was very lethargic yesterday and difficult to evaluate. 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  Progress limited by limited alertness, tracking deficits, unable to move from midline, and unable to follow instructions. Goals for discharge set for mod A overall.  Patient on target to meet rehab goals: no, currently dependent for care; requiring total assist for activities at the bedside.   *See Care Plan and progress notes for long and short-term goals.   Revisions to Treatment Plan:  PEG placement pending Neuro-stimulant trial (Amantadine ) Change to 15/7 therapy schedule  Teaching Needs: Safety, medications, dietary modification/nutritional means, PEG use and care, administration of nutrition/meds through a PEG, skin care, toileting, transfers, etc.   Current Barriers to Discharge: Home enviroment access/layout, Incontinence, Lack of/limited family support, and Nutritional means  Possible Resolutions to Barriers: Family education     Medical Summary Current Status: NPO with wet voice,hx of at least moderate dementia, lethargy  Barriers to Discharge: Inadequate Nutritional Intake;Medical stability   Possible Resolutions to [employer]: trial of neuro stimulants, order UA   Continued  Need for Acute Rehabilitation Level of Care: The patient requires daily medical management by a physician with specialized training in physical medicine and rehabilitation for the following reasons: Direction of a multidisciplinary physical rehabilitation program to maximize functional independence :  Yes Medical management of patient stability for increased activity during participation in an intensive rehabilitation regime.: Yes Analysis of laboratory values and/or radiology reports with any subsequent need for medication adjustment and/or medical intervention. : Yes   I attest that I was present, lead the team conference, and concur with the assessment and plan of the team.   Fredericka Sober B 01/07/2024, 8:50 AM

## 2024-01-07 NOTE — Progress Notes (Signed)
 Occupational Therapy Session Note  Patient Details  Name: Dustin Hamilton MRN: [id number] Date of Birth: [birth year]  Today's Date: 01/07/2024 OT Individual Time: 9099-9060 OT Individual Time Calculation (min): 39 min   Short Term Goals: Week 1:  OT Short Term Goal 1 (Week 1): Pt will follow single step command during ADLs with verbal cues only OT Short Term Goal 2 (Week 1): Pt will locate 5/5 obejcts in L visual field with SUP OT Short Term Goal 3 (Week 1): Pt will maintain sitting balance in preparation for functional transfes with CGA OT Short Term Goal 4 (Week 1): Pt will complete one step of dressing task with SUP  Skilled Therapeutic Interventions/Progress Updates:     Pt received deeply sleeping in bed. Turned on lights, completed sternal rub, applied cold wash cloth to [name] face, and called out [name] name with Pt waking with significantly increased amount of time. Pt presenting to be in 0/10 pain- OT offering intermittent rest breaks, repositioning, and therapeutic support to optimize participation in therapy session. Pt did verbalizing good morning x1 during session in response to OT, however did not verbalize name or verbally respond to other questions from OT. He did nod his head yes/no and squeeze OT's hand when instructed. Assisted Pt to EOB with MAX A x2. Once sitting EOB with B LEs supported on floor, pt able to maintain static sitting balance with CGA-MIN A, however as Pt fatigued he required up to MAX A for sitting balance by the end of OT session. Worked on familiar ADL skills and following single step commands- MAX HOH A required to initiate washing face with R UE and Pt then able to continue tasks, however not applying sufficient pressure to ensure cleanliness. Oral care completed using suction toothbrush with total A. Engaged Pt in completing sit<>stands from EOB x2 trials to facilitate WB'ing through B LEs and to work on trunk control. He required MAX A +2 to come into partial  standing position, however Pt noted to engaged B LEs once in standing, [name] head resting on therapist's shoulder d/t decreased control and fatigue. Pt falling asleep EOB at end of session. Returned to bed MAX A +2. Pt noted to be having BM in brief- informed nursing staff. Pt was left resting in bed with call bell in reach, bed alarm on, and all needs met.    Therapy Documentation Precautions:  Precautions Precautions: Fall Recall of Precautions/Restrictions: Impaired Precaution/Restrictions Comments: L hemipareisis, NPO, CorTrak Restrictions Weight Bearing Restrictions Per Provider Order: No   Therapy/Group: Individual Therapy  Katheryn SHAUNNA Mines 01/07/2024, 8:00 AM

## 2024-01-07 NOTE — Progress Notes (Signed)
 Physical Therapy Session Note  Patient Details  Name: Dustin Hamilton MRN: [id number] Date of Birth: April 16, 1941  Today's Date: 01/07/2024 PT Individual Time: 1310-1355 PT Individual Time Calculation (min): 45 min   Short Term Goals: Week 1:  PT Short Term Goal 1 (Week 1): Pt will perform bed rolling with MaxA +1. PT Short Term Goal 2 (Week 1): Pt will perform supine<>sit with MaxA +1. PT Short Term Goal 3 (Week 1): Pt will perform sit<>stand with MaxA +2. PT Short Term Goal 4 (Week 1): Pt will maintain unsupported seated balance with MinA for more than 3 min. PT Short Term Goal 5 (Week 1): Pt will demonstrate improved initiation by following more than 50% of verbal instructions for mobility.  Skilled Therapeutic Interventions/Progress Updates:       Pt in bed to start - eyes open. Has feeding tube running, nonverbal and no attempts at verbalizations during treatment. No indications of pain or discomfort noted.   Assisted with LB dressing at bed level - threaded catheter through pant leg and donned pants with dependent assist at  bed level. Allowed time for initiation but none noted. Supine<>sitting EOB with totalA with hospital bed features, using chuck pad to facilitate forward scooting to get feet flat to ground. Pt able to sit for brief moments at [facility] with trunk activation noted, posterior lean present. TotalA squat pivot transfer using the over the back technique to facilitate forward leaning and weight shifting, bed also slightly elevated to assist with the transfer.   Patient transported to main gym and setup inside // bars to continue to work on standing, sit<>stands. Multiple attempts made at standing with B knees blocked - unable to achieve full upright standing - stands with trunk flexed and knees bent. Tried to use towel method behind hips but patient lacks initiation for trunk extension and needs more hands on assist to facilitate any upright.   Patient returned to the room and  given alertness, left reclined but up in TIS wheelchair. Seat belt alarm on. Updated safety plan to indicate maxi move for nursing. NT also made aware in handoff. Wife present at the end of session.    Therapy Documentation Precautions:  Precautions Precautions: Fall Recall of Precautions/Restrictions: Impaired Precaution/Restrictions Comments: L hemipareisis, NPO, CorTrak Restrictions Weight Bearing Restrictions Per Provider Order: No General:     Therapy/Group: Individual Therapy  Dustin Hamilton 01/07/2024, 7:43 AM

## 2024-01-07 NOTE — Progress Notes (Signed)
 Speech Language Pathology Daily Session Note  Patient Details  Name: Dustin Hamilton MRN: [id number] Date of Birth: Oct 11, 1940  Today's Date: 01/07/2024 SLP Individual Time: 8953-8855 SLP Individual Time Calculation (min): 58 min  Short Term Goals: Week 1: SLP Short Term Goal 1 (Week 1): Patient will demonstrate readiness for instrumental swallow evaluation SLP Short Term Goal 2 (Week 1): Patient will answer simple yes/no questions with 50% accuracy given max multimodal A SLP Short Term Goal 3 (Week 1): Patient will utilize gestures/verbalizations to communicate basic wants/needs with max multimodal A SLP Short Term Goal 4 (Week 1): Patient will sustain attention to functional tasks for 1 minute given max multimodal A  Skilled Therapeutic Interventions: Skilled therapy session focused on communication and dysphagia goals. Patient more alert this session, with eyes open, though limited-no verbalizations throughout. SLP attempted to target expressive communication through automatic speech tasks including name, numbers, days of the week and singing. Patient with x1 attempt to mouth numbers, though no verbalizations occurred. Patient with occasional nods yes/no, though only given total A from SLP. Patient with x1 independent nod yes when SLP asked if patient would like more ice chips. SLP targeted dysphagia goals through providing suction oral care and offering ice chips. Patient with prolonged oral manipulation and occasional oral holding requiring cues to swallow. Patient with immediate coughs in approx 50% of trials. Patient required suction x1 to clear residual form oral cavity along with phlegm. Recommend continuation of NPO w/ medications, nutrition and hydration offered via Cortrak. Continue to assess for ability to participate in instrumental swallow evaluation. Patient left in bed with alarm set and call bell in reach. Continue POC.    Pain None visualized/verbalized  Therapy/Group:  Individual Therapy  Tamikia Chowning M.A., CCC-SLP 01/07/2024, 7:50 AM

## 2024-01-07 NOTE — Plan of Care (Signed)
  Problem: RH BLADDER ELIMINATION Goal: RH STG MANAGE BLADDER WITH ASSISTANCE Description: STG Manage Bladder With min Assistance Outcome: Progressing   Problem: RH SAFETY Goal: RH STG ADHERE TO SAFETY PRECAUTIONS W/ASSISTANCE/DEVICE Description: STG Adhere to Safety Precautions With cues Assistance/Device. Outcome: Progressing   Problem: RH PAIN MANAGEMENT Goal: RH STG PAIN MANAGED AT OR BELOW [name] PAIN GOAL Description: Pain < 4 with prns Outcome: Progressing

## 2024-01-07 NOTE — Progress Notes (Signed)
 Physical Therapy Session Note  Patient Details  Name: Dustin Hamilton MRN: [id number] Date of Birth: 07/08/1940  Today's Date: 01/06/2024 PT Individual Time: 1515-1601 PT Individual Time Calculation (min): 46 min  Short Term Goals: Week 1:  PT Short Term Goal 1 (Week 1): Pt will perform bed rolling with MaxA +1. PT Short Term Goal 2 (Week 1): Pt will perform supine<>sit with MaxA +1. PT Short Term Goal 3 (Week 1): Pt will perform sit<>stand with MaxA +2. PT Short Term Goal 4 (Week 1): Pt will maintain unsupported seated balance with MinA for more than 3 min. PT Short Term Goal 5 (Week 1): Pt will demonstrate improved initiation by following more than 50% of verbal instructions for mobility.  Skilled Therapeutic Interventions/Progress Updates:  Patient supine in bed on entrance to room. Patient alert and agreeable to PT session. Wife present. Pt with eyes open and initiating acknowledgement to [name] presence. Follows instructions for finger tapping and toe tapping with time for planning/ performance.   Patient with no pain complaint at start of session.  Therapeutic Activity: Bed Mobility: Pt performed supine > sit with improved initiation from previous session and Mod/ MaxA with CGA from +2. Requires MaxA to scoot to EOB. No initiation with simple vc. Provided cueing throughout for technique and effort. Attempted to initiate correction of L lean while seated EOB. Significant lean noted but no complete LOB. Unable to complete requested verbal instruction for reaching activities. Rested LUE in position for elbow and wrist extension with palm flat on bed to increase joint approximation. Continues to require overall CGA with up to Min/ Mod A to maintain upright posture. When assisted to midline, pt demos increased forward lean.  Transfers: Pt performed sit<>stand with Mod/ MaxA +2 in 3 musketeer support with initiation into upright stance and continued activation of LLE. Maintains stance with +2  and initiated lateral weight shifts. With vc, pt is able to initiate step forward with RLE, then MaxA to initiate and complete forward step with LLE. Requires increased guard to block of L knee in stance phase. Ambulation continued with facilitation of lateral weight shift and vc for RLE advancement. Is able to cover 6 feet to reach prior to pivot stepping to reach w/c. Seated rest break in w/c prior to return ambulatory pivot stepping back to bed. Same manner and LOA as above. When seated EOB, pt guided in forward lean required for lateral scoot to R along EOB. After first effort, pt noted to increase initiation/ effort with next 2 attempts. With ModA +2 is able to reach R sidelying  and then follow instructions to roll into supine.   NMR performed for improvements in motor control and coordination, balance, sequencing, judgement, and self confidence/ efficacy in performing all aspects of mobility at highest level of independence.   Patient supine in bed at end of session with brakes locked, bed alarm set, and all needs within reach. Wife present.    Therapy Documentation Precautions:  Precautions Precautions: Fall Recall of Precautions/Restrictions: Impaired Precaution/Restrictions Comments: L hemipareisis, NPO, CorTrak Restrictions Weight Bearing Restrictions Per Provider Order: No General:    Pain:  No pain demonstrated this session.   Therapy/Group: Individual Therapy  Mliss DELENA Milliner PT, DPT, CSRS 01/06/2024, 6:16 PM

## 2024-01-08 ENCOUNTER — Ambulatory Visit: Payer: Medicare PPO

## 2024-01-08 DIAGNOSIS — F01B0 Vascular dementia, moderate, without behavioral disturbance, psychotic disturbance, mood disturbance, and anxiety: Secondary | ICD-10-CM | POA: Diagnosis not present

## 2024-01-08 DIAGNOSIS — I6381 Other cerebral infarction due to occlusion or stenosis of small artery: Secondary | ICD-10-CM | POA: Diagnosis not present

## 2024-01-08 DIAGNOSIS — R1312 Dysphagia, oropharyngeal phase: Secondary | ICD-10-CM | POA: Diagnosis not present

## 2024-01-08 LAB — GLUCOSE, CAPILLARY
Glucose-Capillary: 130 mg/dL — ABNORMAL HIGH (ref 70–99)
Glucose-Capillary: 160 mg/dL — ABNORMAL HIGH (ref 70–99)
Glucose-Capillary: 176 mg/dL — ABNORMAL HIGH (ref 70–99)
Glucose-Capillary: 189 mg/dL — ABNORMAL HIGH (ref 70–99)
Glucose-Capillary: 189 mg/dL — ABNORMAL HIGH (ref 70–99)
Glucose-Capillary: 97 mg/dL (ref 70–99)

## 2024-01-08 MED ORDER — GERHARDT'S BUTT CREAM
TOPICAL_CREAM | Freq: Two times a day (BID) | CUTANEOUS | Status: DC
  Filled 2024-01-08 (×3): qty 60

## 2024-01-08 MED ORDER — METHYLPHENIDATE HCL 5 MG PO TABS
5.0000 mg | ORAL_TABLET | Freq: Two times a day (BID) | ORAL | Status: DC
  Administered 2024-01-09 – 2024-01-11 (×6): 5 mg via ORAL
  Filled 2024-01-08 (×5): qty 1

## 2024-01-08 NOTE — Progress Notes (Signed)
 Occupational Therapy Session Note  Patient Details  Name: Dustin Hamilton MRN: [id number] Date of Birth: 08/29/1940  Today's Date: 01/08/2024 OT Individual Time: 1417-1530 OT Individual Time Calculation (min): 73 min    Short Term Goals: Week 1:  OT Short Term Goal 1 (Week 1): Pt will follow single step command during ADLs with verbal cues only OT Short Term Goal 2 (Week 1): Pt will locate 5/5 obejcts in L visual field with SUP OT Short Term Goal 3 (Week 1): Pt will maintain sitting balance in preparation for functional transfes with CGA OT Short Term Goal 4 (Week 1): Pt will complete one step of dressing task with SUP  Skilled Therapeutic Interventions/Progress Updates:    Patient agreeable to participate in OT session. Reports no pain level.   Patient participated in skilled OT session focusing on self care, sitting balance, alertness, use of UE, proprioception, standing. Patient and OT completed self care for brief hygiene in supine with rolling total A and breif hygiene max A. Wound found during cleaning on sacral region reported to nursing for documentation, dressing, cleaning. Dep to don pants. Supine to sit dep. Patient sat on EOB mod to max A. Utilized positioning and NM techniques to increase proprioception and decrease neglect and increase spatial awareness. Completed stand with x2 max A. Patient returned to supine. Completed NM education utilizing WB and HOH to increase functional use of b/l UE. Alarm on all needs in reach  Therapy Documentation Precautions:  Precautions Precautions: Fall Recall of Precautions/Restrictions: Impaired Precaution/Restrictions Comments: L hemipareisis, NPO, CorTrak Restrictions Weight Bearing Restrictions Per Provider Order: No  Therapy/Group: Individual Therapy  D'mariea L Eshani Maestre 01/08/2024, 7:59 AM

## 2024-01-08 NOTE — Plan of Care (Signed)
  Problem: RH BOWEL ELIMINATION Goal: RH STG MANAGE BOWEL WITH ASSISTANCE Description: STG Manage Bowel with toileting min Assistance. Outcome: Progressing   Problem: RH BLADDER ELIMINATION Goal: RH STG MANAGE BLADDER WITH ASSISTANCE Description: STG Manage Bladder With min Assistance Outcome: Progressing   Problem: RH SAFETY Goal: RH STG ADHERE TO SAFETY PRECAUTIONS W/ASSISTANCE/DEVICE Description: STG Adhere to Safety Precautions With cues Assistance/Device. Outcome: Progressing

## 2024-01-08 NOTE — Progress Notes (Signed)
 PROGRESS NOTE   Subjective/Complaints:  Slightly more alert this am states Good Morning , but did not respond to questions after that  Tolerating TFs Labs reviewed   ROS- limited by cognition   Objective:   No results found. Recent Labs    01/07/24 0429  WBC 8.3  HGB 15.4  HCT 46.4  PLT 143*   Recent Labs    01/07/24 0844  NA 140  K 4.7  CL 106  CO2 27  GLUCOSE 199*  BUN 30*  CREATININE 0.89  CALCIUM  9.9    Intake/Output Summary (Last 24 hours) at 01/08/2024 0843 Last data filed at 01/08/2024 0542 Gross per 24 hour  Intake 1049 ml  Output 900 ml  Net 149 ml        Physical Exam: Vital Signs Blood pressure 98/76, pulse 94, temperature 97.6 F (36.4 C), resp. rate 18, height 5' 11 (1.803 m), weight 63 kg, SpO2 98%. ENT- ortrak in nares  Masked facies  General: No acute distress Mood and affect flat Heart: Regular rate and rhythm no rubs murmurs or extra sounds Lungs: Clear to auscultation, breathing unlabored, no rales or wheezes Abdomen: Positive bowel sounds, soft nontender to palpation, nondistended Extremities: No clubbing, cyanosis, or edema Skin: No evidence of breakdown, no evidence of rash Neurologic: very flat eyes open, minimal movement spontaneously on RIght side , stares straight ahead not tuning head. motor strength is 4/5 in right deltoid, bicep, tricep, grip, hip flexor, knee extensors, ankle dorsiflexor and plantar flexor, 2-/5 LUE and LLE- MMT varies based on LOA  Sensory exam limited by attention and concentration  Cerebellar exam could not cooperate Musculoskeletal: Full range of motion in all 4 extremities. No joint swelling Assessment/Plan: 1. Functional deficits which require 3+ hours per day of interdisciplinary therapy in a comprehensive inpatient rehab setting. Physiatrist is providing close team supervision and 24 hour management of active medical problems listed  below. Physiatrist and rehab team continue to assess barriers to discharge/monitor patient progress toward functional and medical goals  Care Tool:  Bathing    Body parts bathed by patient: Face   Body parts bathed by helper: Left arm, Abdomen, Chest, Right arm, Right upper leg, Left upper leg     Bathing assist Assist Level: 2 Helpers     Upper Body Dressing/Undressing Upper body dressing   What is the patient wearing?: Pull over shirt    Upper body assist Assist Level: 2 Helpers    Lower Body Dressing/Undressing Lower body dressing      What is the patient wearing?: Underwear/pull up, Pants     Lower body assist Assist for lower body dressing: 2 Helpers     Toileting Toileting    Toileting assist Assist for toileting: 2 Helpers     Transfers Chair/bed transfer  Transfers assist  Chair/bed transfer activity did not occur: Safety/medical concerns  Chair/bed transfer assist level: 2 Helpers     Locomotion Ambulation   Ambulation assist   Ambulation activity did not occur: Safety/medical concerns          Walk 10 feet activity   Assist  Walk 10 feet activity did not occur: Safety/medical concerns  Walk 50 feet activity   Assist Walk 50 feet with 2 turns activity did not occur: Safety/medical concerns         Walk 150 feet activity   Assist Walk 150 feet activity did not occur: Safety/medical concerns         Walk 10 feet on uneven surface  activity   Assist Walk 10 feet on uneven surfaces activity did not occur: Safety/medical concerns         Wheelchair     Assist Is the patient using a wheelchair?: Yes (did not use during eval but is expected to use throughout stay and will use on d/c) Type of Wheelchair: Manual Wheelchair activity did not occur: Safety/medical concerns         Wheelchair 50 feet with 2 turns activity    Assist    Wheelchair 50 feet with 2 turns activity did not occur:  Safety/medical concerns       Wheelchair 150 feet activity     Assist  Wheelchair 150 feet activity did not occur: Safety/medical concerns       Blood pressure 98/76, pulse 94, temperature 97.6 F (36.4 C), resp. rate 18, height 5' 11 (1.803 m), weight 63 kg, SpO2 98%.  Medical Problem List and Plan: 1. 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  Antithrombotics: -DVT/anticoagulation:  Pharmaceutical: Lovenox              -antiplatelet therapy: ASA/Plavix  X 3 weeks followed by Plavix  alone 3. Pain Management: Tylenol  prn.  4. Mood/Behavior/Sleep: LCSW to follow for evaluation and support.             --hx of sundowning-->continue melatonin 3 mg/Monitor sleep hygeine             -antipsychotic agents: N/A  Reduced level of alertness trial amantadine - no significant changes will trial methylphenidate  starting in am  5. Neuropsych/cognition: This patient is not capable of making decisions on fully own behalf. 6. Skin/Wound Care:  Routine pressure relief measures.  7. Fluids/Electrolytes/Nutrition: Continue NPO due to holding of orals.              --on tube feeds.  8. HTN: Monitor BP TID--goal 150 to avoid symptoms (multiple ED visits this year)             --hx of chronic dizziness. Needs to stay hydrated. 9. H/o SVT/CHF s/p PPM/syncope: Monitor for orthostatic symptoms  10. BPH/urinary retention: Urecholine  and Proscar  cannot be crushed per pharmacy so will hold (cannot use Avodart  capsule due to TF).  Will check PVRs 11. Dementia: On Namenda ,  12. Glaucoma: Resume home meds. 13. Dysphagia: NPO with tube feeds. May need PEG for  nutritional support.  14. Acute renal failure: Improved rom 24/1.36 to 27/0.99 15. chronic Thrombocytopenia:stable      Latest Ref Rng & Units 01/07/2024    4:29 AM 01/05/2024    5:26 AM 01/03/2024    3:29 AM  CBC  WBC 4.0 - 10.5 K/uL 8.3  4.6  6.7   Hemoglobin 13.0 - 17.0  g/dL 84.5  84.8  84.9   Hematocrit 39.0 - 52.0 % 46.4  44.6  44.6   Platelets 150 - 400 K/uL 143  113  124      16.  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  UA + WBC but only rare bact, treat empirically x 3 d to see if this improves MS No change in cognition noted  LOS: 4 days A FACE TO FACE EVALUATION WAS PERFORMED  Prentice FORBES Compton 01/08/2024, 8:43 AM

## 2024-01-09 DIAGNOSIS — I10 Essential (primary) hypertension: Secondary | ICD-10-CM

## 2024-01-09 DIAGNOSIS — I6381 Other cerebral infarction due to occlusion or stenosis of small artery: Secondary | ICD-10-CM | POA: Diagnosis not present

## 2024-01-09 DIAGNOSIS — D693 Immune thrombocytopenic purpura: Secondary | ICD-10-CM

## 2024-01-09 LAB — GLUCOSE, CAPILLARY
Glucose-Capillary: 135 mg/dL — ABNORMAL HIGH (ref 70–99)
Glucose-Capillary: 144 mg/dL — ABNORMAL HIGH (ref 70–99)
Glucose-Capillary: 145 mg/dL — ABNORMAL HIGH (ref 70–99)
Glucose-Capillary: 152 mg/dL — ABNORMAL HIGH (ref 70–99)
Glucose-Capillary: 161 mg/dL — ABNORMAL HIGH (ref 70–99)

## 2024-01-09 MED ORDER — GABAPENTIN 100 MG PO CAPS
100.0000 mg | ORAL_CAPSULE | Freq: Three times a day (TID) | ORAL | Status: DC | PRN
  Administered 2024-01-09: 100 mg via ORAL
  Filled 2024-01-09: qty 1

## 2024-01-09 NOTE — Progress Notes (Signed)
 Physical Therapy Session Note  Patient Details  Name: Dustin Hamilton MRN: [id number] Date of Birth: 1940-12-09  Today's Date: 01/08/2024 PT Individual Time: 1032-1116 PT Individual Time Calculation (min): 44 min  Short Term Goals: Week 1:  PT Short Term Goal 1 (Week 1): Pt will perform bed rolling with MaxA +1. PT Short Term Goal 2 (Week 1): Pt will perform supine<>sit with MaxA +1. PT Short Term Goal 3 (Week 1): Pt will perform sit<>stand with MaxA +2. PT Short Term Goal 4 (Week 1): Pt will maintain unsupported seated balance with MinA for more than 3 min. PT Short Term Goal 5 (Week 1): Pt will demonstrate improved initiation by following more than 50% of verbal instructions for mobility.  Skilled Therapeutic Interventions/Progress Updates:  Patient supine in bed on entrance to room, eyes closed. Patient not initially alert and requires some assist/ multimodal attempts to wake. Pt slightly opens eyes and then with time will nod head gently to request to sit. Up to take meds. RN in room to provide meds.    Patient with no pain complaint at start of session.  Therapeutic Activity: Bed Mobility: Pt performed supine > sit with TotA. Attempts to have pt initiate moving BLE off EOB unheeded. Once off bed, pt then with minimal effort demo'd in attempt to sit off bed surface. TotA to complete positioning to EOB. Requires totA to scoot in order to place Bil feet on floor. Pt seated upright with MaxA during medication administration via CorTrak.   At end of session, pt requires TotA to return to supine. Used bed features to move pt to [facility].   Neuromuscular Re-ed: NMR facilitated during session with focus on sitting balance, midline orientation, motor control, proprioception. Pt guided in sitting balance activities including attempts to reach, return to midline from passive move into differing positions. Pt unable to actively correct passive leans outside of center of BOS. Multimodal cues and time  provided in attempt to correct but pt requires passive correction from therapist. LUE placed in ext and shoulder ER for palm flat position on bed to improve proprioception. Passively leaned onto and off ofLUE.  NMR performed for improvements in motor control and coordination, balance, sequencing, judgement, and self confidence/ efficacy in performing all aspects of mobility at highest level of independence.   Patient supine in bed at end of session with brakes locked, bed alarm set, and all needs within reach. Noted pause on feeding pump. RN notified.    Therapy Documentation Precautions:  Precautions Precautions: Fall Recall of Precautions/Restrictions: Impaired Precaution/Restrictions Comments: L hemipareisis, NPO, CorTrak Restrictions Weight Bearing Restrictions Per Provider Order: No  Pain: No pain indicated this session.   Therapy/Group: Individual Therapy  Dustin Hamilton PT, DPT, CSRS 01/08/2024, 6:09 PM

## 2024-01-09 NOTE — Progress Notes (Signed)
 PROGRESS NOTE   Subjective/Complaints:  Pt slept well per sleep chart. Shakes head no when asked if he's in pain. Doesn't give much other history, but is able to open his eyes to verbal command.   ROS- limited by cognition   Objective:   No results found. Recent Labs    01/07/24 0429  WBC 8.3  HGB 15.4  HCT 46.4  PLT 143*   Recent Labs    01/07/24 0844  NA 140  K 4.7  CL 106  CO2 27  GLUCOSE 199*  BUN 30*  CREATININE 0.89  CALCIUM  9.9    Intake/Output Summary (Last 24 hours) at 01/09/2024 1219 Last data filed at 01/09/2024 9076 Gross per 24 hour  Intake 450 ml  Output 700 ml  Net -250 ml        Physical Exam: Vital Signs Blood pressure (!) 125/91, pulse 94, temperature (!) 97.5 F (36.4 C), resp. rate (!) 24, height 5' 11 (1.803 m), weight 63 kg, SpO2 97%.  ENT- cortrak in nares  Masked facies  General: No acute distress, sitting at EOB with OT Mood and affect flat Heart: Regular rate and rhythm no rubs murmurs or extra sounds Lungs: Clear to auscultation, breathing unlabored, no rales or wheezes Abdomen: Positive bowel sounds, soft nontender to palpation, nondistended Extremities: No clubbing, cyanosis, or edema Skin: No evidence of breakdown, no evidence of rash over exposed surfaces Neuro: follows very simple command (open eyes) but otherwise doesn't engage much  PRIOR EXAMS: Neurologic: very flat eyes open, minimal movement spontaneously on RIght side , stares straight ahead not tuning head. motor strength is 4/5 in right deltoid, bicep, tricep, grip, hip flexor, knee extensors, ankle dorsiflexor and plantar flexor, 2-/5 LUE and LLE- MMT varies based on LOA  Sensory exam limited by attention and concentration  Cerebellar exam could not cooperate Musculoskeletal: Full range of motion in all 4 extremities. No joint swelling   Assessment/Plan: 1. Functional deficits which require 3+ hours per day  of interdisciplinary therapy in a comprehensive inpatient rehab setting. Physiatrist is providing close team supervision and 24 hour management of active medical problems listed below. Physiatrist and rehab team continue to assess barriers to discharge/monitor patient progress toward functional and medical goals  Care Tool:  Bathing    Body parts bathed by patient: Face   Body parts bathed by helper: Left arm, Abdomen, Chest, Right arm, Right upper leg, Left upper leg     Bathing assist Assist Level: 2 Helpers     Upper Body Dressing/Undressing Upper body dressing   What is the patient wearing?: Pull over shirt    Upper body assist Assist Level: 2 Helpers    Lower Body Dressing/Undressing Lower body dressing      What is the patient wearing?: Underwear/pull up, Pants     Lower body assist Assist for lower body dressing: 2 Helpers     Toileting Toileting    Toileting assist Assist for toileting: 2 Helpers     Transfers Chair/bed transfer  Transfers assist  Chair/bed transfer activity did not occur: Safety/medical concerns  Chair/bed transfer assist level: 2 Helpers     Locomotion Ambulation   Ambulation assist  Ambulation activity did not occur: Safety/medical concerns          Walk 10 feet activity   Assist  Walk 10 feet activity did not occur: Safety/medical concerns        Walk 50 feet activity   Assist Walk 50 feet with 2 turns activity did not occur: Safety/medical concerns         Walk 150 feet activity   Assist Walk 150 feet activity did not occur: Safety/medical concerns         Walk 10 feet on uneven surface  activity   Assist Walk 10 feet on uneven surfaces activity did not occur: Safety/medical concerns         Wheelchair     Assist Is the patient using a wheelchair?: Yes (did not use during eval but is expected to use throughout stay and will use on d/c) Type of Wheelchair: Manual Wheelchair activity  did not occur: Safety/medical concerns         Wheelchair 50 feet with 2 turns activity    Assist    Wheelchair 50 feet with 2 turns activity did not occur: Safety/medical concerns       Wheelchair 150 feet activity     Assist  Wheelchair 150 feet activity did not occur: Safety/medical concerns       Blood pressure (!) 125/91, pulse 94, temperature (!) 97.5 F (36.4 C), resp. rate (!) 24, height 5' 11 (1.803 m), weight 63 kg, SpO2 97%.  Medical Problem List and Plan: 1. 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  Antithrombotics: -DVT/anticoagulation:  Pharmaceutical: Lovenox  40mg  daily             -antiplatelet therapy: ASA/Plavix  X 3 weeks followed by Plavix  alone 3. Pain Management: Tylenol  prn.  4. Mood/Behavior/Sleep: LCSW to follow for evaluation and support.             --hx of sundowning-->continue melatonin 3 mg/Monitor sleep hygeine             -antipsychotic agents: N/A -Reduced level of alertness trial amantadine - no significant changes will trial methylphenidate  5mg  BID starting in am 8/9 5. Neuropsych/cognition: This patient is not capable of making decisions on fully own behalf. 6. Skin/Wound Care:  Routine pressure relief measures.  7. Fluids/Electrolytes/Nutrition: Continue NPO due to holding of orals.              --on tube feeds.  8. HTN: Monitor BP TID--goal 150 to avoid symptoms (multiple ED visits this year)             --hx of chronic dizziness. Needs to stay hydrated.  -01/09/24 BP fine, monitor Vitals:   01/05/24 1449 01/05/24 2037 01/06/24 0411 01/06/24 0800  BP: (!) 141/93 117/71 124/80  120/65   01/06/24 1235 01/06/24 2017 01/07/24 0505 01/07/24 1449  BP: 134/84 103/75 (!) 133/96 (!) 134/94   01/07/24 2007 01/08/24 0452 01/08/24 1349 01/08/24 1951  BP: 107/81 98/76 110/81 (!) 125/91    9. H/o SVT/CHF s/p PPM/syncope: Monitor for orthostatic symptoms  10. BPH/urinary retention: Urecholine  and Proscar  cannot be crushed per pharmacy so will hold (cannot use Avodart  capsule due to TF).  -01/09/24 PVR documented once, 66; monitor; of note, getting urocholine 10mg  TID...  11. Dementia: On Namenda  10mg  BID, Galantamine  4mg  daily 12. Glaucoma: Resume home gtts Alphagan , Trusopt , Xalatan . 13. Dysphagia: NPO with tube feeds. May need PEG for nutritional support.  14. Acute renal failure: Improved from 24/1.36 to 27/0.99>> 30/0.89 15. Chronic Thrombocytopenia: stable      Latest Ref Rng & Units 01/07/2024    4:29 AM 01/05/2024    5:26 AM 01/03/2024    3:29 AM  CBC  WBC 4.0 - 10.5 K/uL 8.3  4.6  6.7   Hemoglobin 13.0 - 17.0 g/dL 84.5  84.8  84.9   Hematocrit 39.0 - 52.0 % 46.4  44.6  44.6   Platelets 150 - 400 K/uL 143  113  124      16.  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  UA + WBC but only rare bact, treat empirically keflex  500mg  BID x 3 d to see if this improves MS -No change in cognition noted   18. Hx of gout: allopurinol  100mg  daily   LOS: 5 days A FACE TO FACE EVALUATION WAS PERFORMED  [address] 01/09/2024, 12:19 PM

## 2024-01-09 NOTE — Progress Notes (Signed)
 Occupational Therapy Session Note  Patient Details  Name: Dustin Hamilton MRN: [id number] Date of Birth: August 12, 1940  Today's Date: 01/09/2024 OT Individual Time: 9065-8963 OT Individual Time Calculation (min): 62 min  and Today's Date: 01/09/2024 OT Missed Time: 13 Minutes Missed Time Reason: Patient fatigue   Short Term Goals: Week 1:  OT Short Term Goal 1 (Week 1): Pt will follow single step command during ADLs with verbal cues only OT Short Term Goal 2 (Week 1): Pt will locate 5/5 obejcts in L visual field with SUP OT Short Term Goal 3 (Week 1): Pt will maintain sitting balance in preparation for functional transfes with CGA OT Short Term Goal 4 (Week 1): Pt will complete one step of dressing task with SUP  Skilled Therapeutic Interventions/Progress Updates:     Pt received sitting up in bed, somewhat awake with eyes partially open. Pt presenting to be receptive to skilled OT session reporting 0/10 pain- OT offering intermittent rest breaks, repositioning, and therapeutic support to optimize participation in therapy session. RN in/out to pause NG feed. Pt able to verbalize good morning and doing fine in response to therapist at beginning of session, however he did not verbalize any other statements during session. Inconsistently nodding head yes/no in response to questions and occasionally opening R eye. Focused this session on body awareness, sitting balance, activity tolerance, and functional cognitive. While sitting up in bed, began session by engaging Pt in completing AAROM knee/hip flex/extension as preparatory task for ADLs and mobility. During AAROM, Pt noted to have had BM in brief. Engaged Pt in rolling in bed R<>L during clean up process- total required to roll to R and max A to roll to L. Pt was able to follow verbal/tactile cues to initiate bending R LE and reaching across midline with min A. Clean brief donned total A. Supine > EOB total A of 1 with Pt able to assist by bending  R LE. Once sitting EOB, Pt requiring MOD-MAX A to maintain sitting balance with  B LEs support on floor- poor postural control and reactionary balance. Using R UE, Pt able to wash face with MOD HOH A required to initiate. Pt also participated in UB dressing by weaving/unweaving R UE from sleeve of shirt when provided with verbal/tactile cues and MIN A to avoid pull on IV. He was able to stand x2 trials with MAX Ax2 using three musketeers method and OT blocking L LE to prevent buckling. When in standing, Pt able to follow verbal cues to try to lift head to more upright position. Seated rest breaks provided between and following each stand. Pt fatigued following therapeutic activities. He transitioned back to supine with MAX x2 and quickly fell asleep. Missed 13 minutes skilled OT treatment d/t fatigue; will attempt to make up time as [name] status and schedule allow. Pt was left resting in bed with call bell in reach, bed alarm on, and all needs met.    Therapy Documentation Precautions:  Precautions Precautions: Fall Recall of Precautions/Restrictions: Impaired Precaution/Restrictions Comments: L hemipareisis, NPO, CorTrak Restrictions Weight Bearing Restrictions Per Provider Order: No   Therapy/Group: Individual Therapy  Katheryn SHAUNNA [place] 01/09/2024, 7:28 AM

## 2024-01-09 NOTE — Progress Notes (Signed)
 Physical Therapy Session Note  Patient Details  Name: Dustin Hamilton MRN: [id number] Date of Birth: 07-18-1940  Today's Date: 01/08/2024 PT Missed Time: 45 Minutes Missed Time Reason: Patient fatigue  Short Term Goals: Week 1:  PT Short Term Goal 1 (Week 1): Pt will perform bed rolling with MaxA +1. PT Short Term Goal 2 (Week 1): Pt will perform supine<>sit with MaxA +1. PT Short Term Goal 3 (Week 1): Pt will perform sit<>stand with MaxA +2. PT Short Term Goal 4 (Week 1): Pt will maintain unsupported seated balance with MinA for more than 3 min. PT Short Term Goal 5 (Week 1): Pt will demonstrate improved initiation by following more than 50% of verbal instructions for mobility.  Skilled Therapeutic Interventions/Progress Updates:  Patient supine in bed on entrance to room. Patient asleep and despite numerous attempts, unable to rouse patient.   Pt missed of skilled therapy due to fatigue. Will re-attempt as schedule and pt availability permits.  Patient supine at end of session with brakes locked, bed alarm set, and all needs within reach.   Therapy Documentation Precautions:  Precautions Precautions: Fall Recall of Precautions/Restrictions: Impaired Precaution/Restrictions Comments: L hemipareisis, NPO, CorTrak Restrictions Weight Bearing Restrictions Per Provider Order: No  Pain:  No appearance of pain.   Therapy/Group: Individual Therapy  Mliss DELENA Milliner PT, DPT, CSRS 01/08/2024, 6:03 PM

## 2024-01-09 NOTE — Progress Notes (Signed)
 Speech Language Pathology Daily Session Note  Patient Details  Name: Dustin Hamilton MRN: [id number] Date of Birth: 1940-07-13  Today's Date: 01/09/2024 SLP Individual Time: 1130-1200 SLP Individual Time Calculation (min): 30 min  Short Term Goals: Week 1: SLP Short Term Goal 1 (Week 1): Patient will demonstrate readiness for instrumental swallow evaluation SLP Short Term Goal 2 (Week 1): Patient will answer simple yes/no questions with 50% accuracy given max multimodal A SLP Short Term Goal 3 (Week 1): Patient will utilize gestures/verbalizations to communicate basic wants/needs with max multimodal A SLP Short Term Goal 4 (Week 1): Patient will sustain attention to functional tasks for 1 minute given max multimodal A  Skilled Therapeutic Interventions:   Pt and son greeted at bedside for tx tasks targeting language and dysphagia. He was asleep upon SLP arrival. Very difficult to rouse. Initially did not respond to tactile or verbal cueing from SLP, however, he was able to respond yeah x1 when asked about ice chips after completion of oral care via suctioning. Extensive oral care required d/t built up secretions on hard palate. During ice chip trials (x2), he required constant cueing to manipulate bolus and initiate swallow. Explosive cough x1 noted. Anticipate inattention/limited alertness resulted in freespill of the bolus. PO trials discontinued. At the end of tx tasks, he was left upright in bed w/ his alarm set and son present. Recommend cont ST per POC.   Pain  Unable to report pain - appeared comfortable  Therapy/Group: Individual Therapy  Recardo DELENA Mole 01/09/2024, 12:23 PM

## 2024-01-09 NOTE — Progress Notes (Signed)
 Physical Therapy Session Note  Patient Details  Name: Dustin WATERSON MRN: [id number] Date of Birth: 1941-04-02  Today's Date: 01/09/2024 PT Individual Time: 8570-8495 PT Individual Time Calculation (min): 35 min   Short Term Goals: Week 1:  PT Short Term Goal 1 (Week 1): Pt will perform bed rolling with MaxA +1. PT Short Term Goal 2 (Week 1): Pt will perform supine<>sit with MaxA +1. PT Short Term Goal 3 (Week 1): Pt will perform sit<>stand with MaxA +2. PT Short Term Goal 4 (Week 1): Pt will maintain unsupported seated balance with MinA for more than 3 min. PT Short Term Goal 5 (Week 1): Pt will demonstrate improved initiation by following more than 50% of verbal instructions for mobility.  Skilled Therapeutic Interventions/Progress Updates:  Patient supine in bed on entrance to room. [name] wife attempting to keep pt alert for PT session. Patient minimally alert and agreeable to PT session. Only demos eyes open for brief periods but is seen to slowly process and eventually show level of alertness with finger or toe tapping to music wife is playing for him.   Patient with no pain complaint at start of session.  Therapeutic Activity: Bed Mobility: Pt performed supine <> sit with MaxA +2. VC/ tc provided for attempt for pt to initiate. Transfers: Pt performed sit<>stand transfers during session  with RUE assist from +2 and underarm support from therapist at [facility]. Pt noted to initiate BLE quad activation and forward trunk lean with facilitation into stance. Provided vc/ tc for initiation and execution.  Neuromuscular Re-ed: NMR facilitated during session with focus on standing balance, tolerance, proprioception, motor control. Pt guided in rise to stand, static stance with attempt to reduce level of assist but pt fatigued and LLE slowly flexing. Also guided in lateral weight shifts to music, minisquats. Requires MaxA from therapist and CGA/ MinA from +2.  Unable to extend trunk throughout  stance.   NMR performed for improvements in motor control and coordination, balance, sequencing, judgement, and self confidence/ efficacy in performing all aspects of mobility at highest level of independence.   Patient supine in bed at end of session with brakes locked, bed alarm set, and all needs within reach.   Therapy Documentation Precautions:  Precautions Precautions: Fall Recall of Precautions/Restrictions: Impaired Precaution/Restrictions Comments: L hemipareisis, NPO, CorTrak Restrictions Weight Bearing Restrictions Per Provider Order: No  Pain:     Therapy/Group: Individual Therapy  Mliss DELENA Milliner PT, DPT, CSRS 01/09/2024, 7:06 PM

## 2024-01-09 NOTE — Progress Notes (Signed)
 Pt with hiccups, gabapentin  PRN started 100mg  TID

## 2024-01-10 DIAGNOSIS — D693 Immune thrombocytopenic purpura: Secondary | ICD-10-CM | POA: Diagnosis not present

## 2024-01-10 DIAGNOSIS — I10 Essential (primary) hypertension: Secondary | ICD-10-CM | POA: Diagnosis not present

## 2024-01-10 DIAGNOSIS — I6381 Other cerebral infarction due to occlusion or stenosis of small artery: Secondary | ICD-10-CM | POA: Diagnosis not present

## 2024-01-10 LAB — GLUCOSE, CAPILLARY
Glucose-Capillary: 106 mg/dL — ABNORMAL HIGH (ref 70–99)
Glucose-Capillary: 144 mg/dL — ABNORMAL HIGH (ref 70–99)
Glucose-Capillary: 177 mg/dL — ABNORMAL HIGH (ref 70–99)
Glucose-Capillary: 184 mg/dL — ABNORMAL HIGH (ref 70–99)
Glucose-Capillary: 97 mg/dL (ref 70–99)

## 2024-01-10 NOTE — Progress Notes (Signed)
 Physical Therapy Session Note  Patient Details  Name: Dustin Hamilton MRN: [id number] Date of Birth: [birth year]  Today's Date: 01/10/2024 PT Individual Time: 1303-1330 PT Individual Time Calculation (min): 27 min   Short Term Goals: Week 1:  PT Short Term Goal 1 (Week 1): Pt will perform bed rolling with MaxA +1. PT Short Term Goal 2 (Week 1): Pt will perform supine<>sit with MaxA +1. PT Short Term Goal 3 (Week 1): Pt will perform sit<>stand with MaxA +2. PT Short Term Goal 4 (Week 1): Pt will maintain unsupported seated balance with MinA for more than 3 min. PT Short Term Goal 5 (Week 1): Pt will demonstrate improved initiation by following more than 50% of verbal instructions for mobility.  Skilled Therapeutic Interventions/Progress Updates: Pt presented in bed sleeping and requiring significant stimuli to arouse (lights, washcloth, and sternal rub). Once pt aroused required continuous effort to keep pt awake. PT appears in NAD throughout session. Pt required maxA near total A for supine to sit due to lethargy. While sitting EOB PTA attempted to have pt work on sitting balance with pt demonstrating absent initiation when provided commands. PTA placing hands on EOB and requesting pt to move R hand to this therapist's lap, PTA also attempted to have pt lean onto elbow for wt bearing through LUE or to push up with RUE back to midline with pt unable to initiate nor complete task when PTA providing assist for transitional movement. Pt noted to be nodding off throughout session therefore deemed unsafe at this time to attempt standing due to lethargy. While pt sitting EOB pt requiring minA to modA for sitting balance and absent righting reactions noted when leaning laterally or posteriorly. Pt returned to supine total A and required dependent x 2 to boost to [facility]. Pt repositioned to comfort and left in bed at end of session with call bell within reach and needs met.      Therapy  Documentation Precautions:  Precautions Precautions: Fall Recall of Precautions/Restrictions: Impaired Precaution/Restrictions Comments: L hemipareisis, NPO, CorTrak Restrictions Weight Bearing Restrictions Per Provider Order: No General: PT Amount of Missed Time (min): 18 Minutes PT Missed Treatment Reason: Other (Comment) (lethargy) Vital Signs:      Therapy/Group: Individual Therapy  Aparna Vanderweele 01/10/2024, 1:44 PM

## 2024-01-10 NOTE — Progress Notes (Signed)
 PROGRESS NOTE   Subjective/Complaints:  Pt slept well per report. Got gabapentin  yesterday for hiccups, which seem to be resolved today. No pain reported to nursing. LBM yesterday per nursing then again this AM after eval. Incontinent of urine per nursing. Doesn't give much other history, doesn't open eyes much, says no when asked if abd palpation hurts.   ROS- limited by cognition   Objective:   No results found. No results for input(s): WBC, HGB, HCT, PLT in the last 72 hours.  No results for input(s): NA, K, CL, CO2, GLUCOSE, BUN, CREATININE, CALCIUM  in the last 72 hours.   Intake/Output Summary (Last 24 hours) at 01/10/2024 1122 Last data filed at 01/10/2024 0657 Gross per 24 hour  Intake 4406 ml  Output 970 ml  Net 3436 ml        Physical Exam: Vital Signs Blood pressure 95/68, pulse 94, temperature 97.8 F (36.6 C), temperature source Oral, resp. rate 18, height 5' 11 (1.803 m), weight 63 kg, SpO2 98%.  ENT- cortrak in nares  Masked facies  General: No acute distress, resting in bed. Mood and affect flat, doesn't want to open eyes much today Heart: Regular rate and rhythm no rubs murmurs or extra sounds Lungs: Clear to auscultation, breathing unlabored, no rales or wheezes Abdomen: Positive bowel sounds, soft nontender to palpation, nondistended Extremities: No clubbing, cyanosis, or edema Skin: No evidence of breakdown, no evidence of rash over exposed surfaces Neuro: follows very simple command (open eyes) but otherwise doesn't engage much  PRIOR EXAMS: Neurologic: very flat eyes open, minimal movement spontaneously on RIght side , stares straight ahead not tuning head. motor strength is 4/5 in right deltoid, bicep, tricep, grip, hip flexor, knee extensors, ankle dorsiflexor and plantar flexor, 2-/5 LUE and LLE- MMT varies based on LOA  Sensory exam limited by attention and  concentration  Cerebellar exam could not cooperate Musculoskeletal: Full range of motion in all 4 extremities. No joint swelling   Assessment/Plan: 1. Functional deficits which require 3+ hours per day of interdisciplinary therapy in a comprehensive inpatient rehab setting. Physiatrist is providing close team supervision and 24 hour management of active medical problems listed below. Physiatrist and rehab team continue to assess barriers to discharge/monitor patient progress toward functional and medical goals  Care Tool:  Bathing    Body parts bathed by patient: Face   Body parts bathed by helper: Left arm, Abdomen, Chest, Right arm, Right upper leg, Left upper leg     Bathing assist Assist Level: 2 Helpers     Upper Body Dressing/Undressing Upper body dressing   What is the patient wearing?: Pull over shirt    Upper body assist Assist Level: 2 Helpers    Lower Body Dressing/Undressing Lower body dressing      What is the patient wearing?: Underwear/pull up, Pants     Lower body assist Assist for lower body dressing: 2 Helpers     Toileting Toileting    Toileting assist Assist for toileting: 2 Helpers     Transfers Chair/bed transfer  Transfers assist  Chair/bed transfer activity did not occur: Safety/medical concerns  Chair/bed transfer assist level: 2 Helpers  Locomotion Ambulation   Ambulation assist   Ambulation activity did not occur: Safety/medical concerns          Walk 10 feet activity   Assist  Walk 10 feet activity did not occur: Safety/medical concerns        Walk 50 feet activity   Assist Walk 50 feet with 2 turns activity did not occur: Safety/medical concerns         Walk 150 feet activity   Assist Walk 150 feet activity did not occur: Safety/medical concerns         Walk 10 feet on uneven surface  activity   Assist Walk 10 feet on uneven surfaces activity did not occur: Safety/medical concerns          Wheelchair     Assist Is the patient using a wheelchair?: Yes (did not use during eval but is expected to use throughout stay and will use on d/c) Type of Wheelchair: Manual Wheelchair activity did not occur: Safety/medical concerns         Wheelchair 50 feet with 2 turns activity    Assist    Wheelchair 50 feet with 2 turns activity did not occur: Safety/medical concerns       Wheelchair 150 feet activity     Assist  Wheelchair 150 feet activity did not occur: Safety/medical concerns       Blood pressure 95/68, pulse 94, temperature 97.8 F (36.6 C), temperature source Oral, resp. rate 18, height 5' 11 (1.803 m), weight 63 kg, SpO2 98%.  Medical Problem List and Plan: 1. 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  Antithrombotics: -DVT/anticoagulation:  Pharmaceutical: Lovenox  40mg  daily             -antiplatelet therapy: ASA/Plavix  X 3 weeks followed by Plavix  alone 3. Pain Management: Tylenol  prn.  4. Mood/Behavior/Sleep: LCSW to follow for evaluation and support.             --hx of sundowning-->continue melatonin 3 mg/Monitor sleep hygeine             -antipsychotic agents: N/A -Reduced level of alertness trial amantadine - no significant changes will trial methylphenidate  5mg  BID starting in am 8/9 5. Neuropsych/cognition: This patient is not capable of making decisions on fully own behalf. 6. Skin/Wound Care:  Routine pressure relief measures.  7. Fluids/Electrolytes/Nutrition: Continue NPO due to holding of orals.              --on tube feeds.  8. HTN: Monitor BP TID--goal 150 to avoid symptoms  (multiple ED visits this year)             --hx of chronic dizziness. Needs to stay hydrated.  -8/9-10/25 BP fine, monitor Vitals:   01/06/24 0800 01/06/24 1235 01/06/24 2017 01/07/24 0505  BP: 120/65 134/84 103/75 (!) 133/96   01/07/24 1449 01/07/24 2007 01/08/24 0452 01/08/24 1349  BP: (!) 134/94 107/81 98/76 110/81   01/08/24 1951 01/09/24 1520 01/09/24 2049 01/10/24 0425  BP: (!) 125/91 118/87 119/80 95/68    9. H/o SVT/CHF  s/p PPM/syncope: Monitor for orthostatic symptoms   10. BPH/urinary retention: Urecholine  and Proscar  cannot be crushed per pharmacy so will hold (cannot use Avodart  capsule due to TF).  -01/09/24 PVR documented once, 66; monitor; of note, getting urocholine 10mg  TID... -01/10/24 low PVRs but not many documented  11. Dementia: On Namenda  10mg  BID, Galantamine  4mg  daily 12. Glaucoma: Resume home gtts Alphagan , Trusopt , Xalatan . 13. Dysphagia: NPO with tube feeds. May need PEG for nutritional support.  14. Acute renal failure: Improved from 24/1.36 to 27/0.99>> 30/0.89 15. Chronic Thrombocytopenia: stable      Latest Ref Rng & Units 01/07/2024    4:29 AM 01/05/2024    5:26 AM 01/03/2024    3:29 AM  CBC  WBC 4.0 - 10.5 K/uL 8.3  4.6  6.7   Hemoglobin 13.0 - 17.0 g/dL 84.5  84.8  84.9   Hematocrit 39.0 - 52.0 % 46.4  44.6  44.6   Platelets 150 - 400 K/uL 143  113  124      16.  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  UA + WBC but only rare bact, treat empirically keflex  500mg  BID x 3 d to see if this improves MS -No change in cognition noted   18. Hx of gout: allopurinol  100mg  daily   LOS: 6 days A FACE TO FACE EVALUATION WAS PERFORMED  [address] 01/10/2024, 11:22 AM

## 2024-01-10 NOTE — Progress Notes (Addendum)
 Occupational Therapy Session Note  Patient Details  Name: Dustin Hamilton MRN: [id number] Date of Birth: November 06, 1940  Today's Date: 01/10/2024 OT Individual Time: 1000-1100, 2:45-3:30 OT Individual Time Calculation (min): 60 min , 45 Mins   Short Term Goals: Week 1:  OT Short Term Goal 1 (Week 1): Pt will follow single step command during ADLs with verbal cues only OT Short Term Goal 2 (Week 1): Pt will locate 5/5 obejcts in L visual field with SUP OT Short Term Goal 3 (Week 1): Pt will maintain sitting balance in preparation for functional transfes with CGA OT Short Term Goal 4 (Week 1): Pt will complete one step of dressing task with SUP  Skilled Therapeutic Interventions/Progress Updates:     (1st) OT Treatment Session:  Patient resting in bed at the time of arrival. The pt presented as very lethargic. The pt's face was washed at Dep level of care to awaken him, however, he remained lethargic .  The pt had soiled his brief so staff was able to assist with hygiene at bed LOF.  The pt was able to roll from supine to the L and R  at Dep LOF for peri care which was completed for the front and the back at Dep LOF.  The pt was Dep for the application of his brief. The pt was MaxAx2  for repositioning up in bed.  The pt's over head shirt was doffed at Dep LOF and a hospital gown was donned at Dep level of care as well. At the end of the session the call light and bedside table were placed within reach with all additional needs addressed.    (2nd OT Treatment Session: Patient resting in bed at the time of arrival. Patient still presents as very lathargic, patient able to tolerate PROM of BUE.  The pt had soiled his brief and the NT came in during the session and was able to assist with cleaning the pt up.  The pt was Depx2 for rolling supine to L and R for peri care for his front and back.  The pt was Dep  for donning his brief.  The pt was Dep x 2 for repostioning up in bed. Prior to exiting the  room, the call light and bedside table were placed within reach with all additional needs addressed.  Therapy Documentation Precautions:  Precautions Precautions: Fall Recall of Precautions/Restrictions: Impaired Precaution/Restrictions Comments: L hemipareisis, NPO, CorTrak Restrictions Weight Bearing Restrictions Per Provider Order: No   Therapy/Group: Individual Therapy  [name] 01/10/2024, 12:43 PM

## 2024-01-11 DIAGNOSIS — F01B0 Vascular dementia, moderate, without behavioral disturbance, psychotic disturbance, mood disturbance, and anxiety: Secondary | ICD-10-CM | POA: Diagnosis not present

## 2024-01-11 DIAGNOSIS — R1312 Dysphagia, oropharyngeal phase: Secondary | ICD-10-CM | POA: Diagnosis not present

## 2024-01-11 DIAGNOSIS — I6381 Other cerebral infarction due to occlusion or stenosis of small artery: Secondary | ICD-10-CM | POA: Diagnosis not present

## 2024-01-11 LAB — BASIC METABOLIC PANEL WITH GFR
Anion gap: 7 (ref 5–15)
BUN: 39 mg/dL — ABNORMAL HIGH (ref 8–23)
CO2: 27 mmol/L (ref 22–32)
Calcium: 9.2 mg/dL (ref 8.9–10.3)
Chloride: 106 mmol/L (ref 98–111)
Creatinine, Ser: 1.01 mg/dL (ref 0.61–1.24)
GFR, Estimated: >60 mL/min (ref >60)
Glucose, Bld: 178 mg/dL — ABNORMAL HIGH (ref 70–99)
Potassium: 4.4 mmol/L (ref 3.5–5.1)
Sodium: 140 mmol/L (ref 135–145)

## 2024-01-11 LAB — CBC
HCT: 45.2 % (ref 39.0–52.0)
Hemoglobin: 15.2 g/dL (ref 13.0–17.0)
MCH: 31.9 pg (ref 26.0–34.0)
MCHC: 33.6 g/dL (ref 30.0–36.0)
MCV: 95 fL (ref 80.0–100.0)
Platelets: 138 K/uL — ABNORMAL LOW (ref 150–400)
RBC: 4.76 MIL/uL (ref 4.22–5.81)
RDW: 13.6 % (ref 11.5–15.5)
WBC: 5.6 K/uL (ref 4.0–10.5)
nRBC: 0 % (ref 0.0–0.2)

## 2024-01-11 LAB — GLUCOSE, CAPILLARY
Glucose-Capillary: 156 mg/dL — ABNORMAL HIGH (ref 70–99)
Glucose-Capillary: 172 mg/dL — ABNORMAL HIGH (ref 70–99)
Glucose-Capillary: 200 mg/dL — ABNORMAL HIGH (ref 70–99)
Glucose-Capillary: 112 mg/dL — ABNORMAL HIGH (ref 70–99)
Glucose-Capillary: 191 mg/dL — ABNORMAL HIGH (ref 70–99)
Glucose-Capillary: 147 mg/dL — ABNORMAL HIGH (ref 70–99)

## 2024-01-11 MED ORDER — FREE WATER
200.0000 mL | Freq: Four times a day (QID) | Status: DC
  Administered 2024-01-11 – 2024-01-18 (×40): 200 mL

## 2024-01-11 MED ORDER — METHYLPHENIDATE HCL 5 MG PO TABS
10.0000 mg | ORAL_TABLET | Freq: Two times a day (BID) | ORAL | Status: DC
  Administered 2024-01-11 – 2024-01-18 (×19): 10 mg via ORAL
  Filled 2024-01-11 (×14): qty 2

## 2024-01-11 NOTE — Progress Notes (Signed)
 Nutrition Follow-up  DOCUMENTATION CODES:   Severe malnutrition in context of chronic illness  INTERVENTION:   Continue tube feeding via Cortrak: Osmolite 1.5 at 60 ml/h (may hold for up to 4 hours per day for therapies) Prosource TF20 60 ml once daily. Free water  flushes 200 ml q6h. Provides 1880 kcal, 95 gm protein, 1714 ml free water  daily.  NUTRITION DIAGNOSIS:   Severe Malnutrition related to chronic illness as evidenced by severe muscle depletion, severe fat depletion, percent weight loss (12% weight loss within 3 months); ongoing.  GOAL:   Patient will meet greater than or equal to 90% of their needs; met with TF.  MONITOR:   TF tolerance, Diet advancement  REASON FOR ASSESSMENT:   Consult    ASSESSMENT:   83 yo male admitted with functional decline r/t stroke. PMH includes HTN, CVD, BPH, CVA, former alcohol  and mixed drug abuse, glaucoma, dementia, Parkinsonism.  Cortrak in place; receiving Osmolite 1.5 at 60 ml/h (for 20 hours) with Prosource TF20 60 ml daily. TF held for therapies. Patient may require PEG. Currently tolerating TF without difficulty.  Remains NPO.  Labs reviewed.  CBG: [id number]  Medications reviewed and include MVI with minerals.  Admit weight 63 kg, no new weight available.  Diet Order:   Diet Order             Diet NPO time specified  Diet effective now                   EDUCATION NEEDS:   No education needs have been identified at this time  Skin:  Skin Assessment: Reviewed RN Assessment (irritant contact dermatitis to coccyx)  Last BM:  8/11 type 5  Height:   Ht Readings from Last 1 Encounters:  01/04/24 5' 11 (1.803 m)    Weight:   Wt Readings from Last 1 Encounters:  01/04/24 63 kg    Ideal Body Weight:  78.2 kg  BMI:  Body mass index is 19.37 kg/m.  Estimated Nutritional Needs:   Kcal:  1750-1950  Protein:  80-100 gm  Fluid:  1.75-1.95 L   Suzen HUNT RD, LDN, CNSC Contact via secure  chat. If unavailable, use group chat RD Inpatient.

## 2024-01-11 NOTE — Progress Notes (Signed)
 Physical Therapy Session Note  Patient Details  Name: Dustin Hamilton MRN: [id number] Date of Birth: [birth year]  Today's Date: 01/11/2024 PT Individual Time: 1116-1202 PT Individual Time Calculation (min): 46 min   Short Term Goals: Week 1:  PT Short Term Goal 1 (Week 1): Pt will perform bed rolling with MaxA +1. PT Short Term Goal 2 (Week 1): Pt will perform supine<>sit with MaxA +1. PT Short Term Goal 3 (Week 1): Pt will perform sit<>stand with MaxA +2. PT Short Term Goal 4 (Week 1): Pt will maintain unsupported seated balance with MinA for more than 3 min. PT Short Term Goal 5 (Week 1): Pt will demonstrate improved initiation by following more than 50% of verbal instructions for mobility.  Skilled Therapeutic Interventions/Progress Updates:  Patient supine in bed and asleep on entrance to room. Requires time to partially wake and demonstrates by tapping finger and foot to music playing for pt to wake. RN and NT present for attempt to collect urine for sample d/t hematuria. Pt does not urinate while they are present. Bladder scan from NT successful but pt not holding much urine. Will attempt to collect at another time.   Patient with no pain complaint at start of session.  Pt performed supine <> sit with TotA +1. VC/ tc required for abdominal activation to assist with rise to sit on EOB. Dependent/ TotA squat pivot transfers to bed<>w/c<>mat table with physical setup of body.   Seated balance challenged at [facility] with attempt to have pt reach to targets. Is able to lift RUE but not LUE with cues. Holds posture d/t increased trunkal tone but continues with slow melt posteriorly or to L. Requires up to MaxA to maintain seated balance throughout attempts for reaching or correcting balance. Music playing and pt able to tap R forefinger or R heel to tap to music.   Patient supine in bed at end of session with brakes locked, bed alarm set, and all needs within reach. Prevalon boots donned TotA.   NT informed as to pt's disposition and potential need for brief change.    Therapy Documentation Precautions:  Precautions Precautions: Fall Recall of Precautions/Restrictions: Impaired Precaution/Restrictions Comments: L hemipareisis, NPO, CorTrak Restrictions Weight Bearing Restrictions Per Provider Order: No  Pain:  No pain indicated by pt this session.   Therapy/Group: Individual Therapy  Mliss DELENA Milliner PT, DPT, CSRS 01/11/2024, 11:17 AM

## 2024-01-11 NOTE — Progress Notes (Signed)
 Occupational Therapy Session Note  Patient Details  Name: Dustin Hamilton MRN: [id number] Date of Birth: 10/20/1940  Today's Date: 01/11/2024 OT Individual Time: 9084-8991 OT Individual Time Calculation (min): 53 min    Short Term Goals: Week 1:  OT Short Term Goal 1 (Week 1): Pt will follow single step command during ADLs with verbal cues only OT Short Term Goal 2 (Week 1): Pt will locate 5/5 obejcts in L visual field with SUP OT Short Term Goal 3 (Week 1): Pt will maintain sitting balance in preparation for functional transfes with CGA OT Short Term Goal 4 (Week 1): Pt will complete one step of dressing task with SUP  Skilled Therapeutic Interventions/Progress Updates:     Pt received deeply sleeping in bed. Turned on lights, completed sternal rub, applied cold wash cloth to [name] face, and called out [name] name with Pt waking with significantly increased amount of time. Pt presenting to be in 0/10 pain- OT offering intermittent rest breaks, repositioning, and therapeutic support to optimize participation in therapy session. Pt intermittently opening eyes during session. He verbally responded to OT stating good morning, however are you, however those are the only statements he made during session. Pt did inconsistently nod head yes/no during session. Focused this session on [name] alertness, task initiation, sitting balance, and following single step commands. Pt noted to have blood in his urine (tea colored) that is new from last time this therapist saw Pt on 08/09- informed [name] RN and medical team. Engaged Pt in washing face at beginning of session to increased alertness with MAX HOH required initially with Pt then able to continue task. Transitioned Pt to EOB MAX A +2 MIN A with Pt participating by attempting to bring R LE to EOB when instructed by OT. MIN improvement in opening eyes once EOB. While sitting EOB during ADLs, facilitated WB'ing into L UE for increased proprioceptive feedback to  facilitate NMRE. MOD-MAX A required for sitting balance during ADL. Completed oral care total A to increase alertness levels with some improvement. Engaged Pt in U/LB dressing tasks- weaved L UE total A and Pt did initiate weaving R UE through sleeve when provided with verbal and tactile cues and min HOH A to locate sleeve. When weaving B LEs, L LE weaved into pants total A and Pt able to partially lift R LE to weave into pants. He stood with MAX A x2 and pants were brought to pants total A. Seated rest break provided. Pt able to complete additional stand MAX x2 with OT blocking L LE to prevent buckling, facilitating WB'ing into L LE for improved proprioceptive feedback. Pt extremely fatigued following, returned to bed total Ax2. Pt was left resting in bed with call bell in reach, bed alarm on, and all needs met.    Therapy Documentation Precautions:  Precautions Precautions: Fall Recall of Precautions/Restrictions: Impaired Precaution/Restrictions Comments: L hemipareisis, NPO, CorTrak Restrictions Weight Bearing Restrictions Per Provider Order: No   Therapy/Group: Individual Therapy  Katheryn SHAUNNA [place] 01/11/2024, 7:52 AM

## 2024-01-11 NOTE — Progress Notes (Signed)
 PROGRESS NOTE   Subjective/Complaints:  Continues with reduced level of alertness eyes closed will say good morning but no other verbal responses to questions  ROS- limited by cognition   Objective:   No results found. Recent Labs    01/11/24 0457  WBC 5.6  HGB 15.2  HCT 45.2  PLT 138*    Recent Labs    01/11/24 0457  NA 140  K 4.4  CL 106  CO2 27  GLUCOSE 178*  BUN 39*  CREATININE 1.01  CALCIUM  9.2     Intake/Output Summary (Last 24 hours) at 01/11/2024 1024 Last data filed at 01/11/2024 0834 Gross per 24 hour  Intake 600 ml  Output 225 ml  Net 375 ml        Physical Exam: Vital Signs Blood pressure 104/73, pulse 93, temperature 98.4 F (36.9 C), resp. rate 17, height 5' 11 (1.803 m), weight 63 kg, SpO2 96%.  ENT- cortrak in nares  Masked facies  General: No acute distress, resting in bed. Mood and affect flat, doesn't want to open eyes much today Heart: Regular rate and rhythm no rubs murmurs or extra sounds Lungs: Clear to auscultation, breathing unlabored, no rales or wheezes Abdomen: Positive bowel sounds, soft nontender to palpation, nondistended Extremities: No clubbing, cyanosis, or edema Skin: No evidence of breakdown, no evidence of rash over exposed surfaces Neuro: follows very simple command (open eyes) but otherwise doesn't engage much Neurologic: masked facies, eyes closed , no spontaneous movement . motor strength is 4/5 in right deltoid, bicep, tricep, grip, hip flexor, knee extensors, ankle dorsiflexor and plantar flexor, 2-/5 LUE and LLE- MMT varies based on LOA  Sensory exam limited by attention and concentration  Cerebellar exam could not cooperate Musculoskeletal: no pain with hip or knee ROM BLE   Assessment/Plan: 1. Functional deficits which require 3+ hours per day of interdisciplinary therapy in a comprehensive inpatient rehab setting. Physiatrist is providing close  team supervision and 24 hour management of active medical problems listed below. Physiatrist and rehab team continue to assess barriers to discharge/monitor patient progress toward functional and medical goals  Care Tool:  Bathing    Body parts bathed by patient: Face   Body parts bathed by helper: Left arm, Abdomen, Chest, Right arm, Right upper leg, Left upper leg     Bathing assist Assist Level: 2 Helpers     Upper Body Dressing/Undressing Upper body dressing   What is the patient wearing?: Pull over shirt    Upper body assist Assist Level: 2 Helpers    Lower Body Dressing/Undressing Lower body dressing      What is the patient wearing?: Underwear/pull up, Pants     Lower body assist Assist for lower body dressing: 2 Helpers     Toileting Toileting    Toileting assist Assist for toileting: 2 Helpers     Transfers Chair/bed transfer  Transfers assist  Chair/bed transfer activity did not occur: Safety/medical concerns  Chair/bed transfer assist level: 2 Helpers     Locomotion Ambulation   Ambulation assist   Ambulation activity did not occur: Safety/medical concerns          Walk 10 feet activity  Assist  Walk 10 feet activity did not occur: Safety/medical concerns        Walk 50 feet activity   Assist Walk 50 feet with 2 turns activity did not occur: Safety/medical concerns         Walk 150 feet activity   Assist Walk 150 feet activity did not occur: Safety/medical concerns         Walk 10 feet on uneven surface  activity   Assist Walk 10 feet on uneven surfaces activity did not occur: Safety/medical concerns         Wheelchair     Assist Is the patient using a wheelchair?: Yes (did not use during eval but is expected to use throughout stay and will use on d/c) Type of Wheelchair: Manual Wheelchair activity did not occur: Safety/medical concerns         Wheelchair 50 feet with 2 turns  activity    Assist    Wheelchair 50 feet with 2 turns activity did not occur: Safety/medical concerns       Wheelchair 150 feet activity     Assist  Wheelchair 150 feet activity did not occur: Safety/medical concerns       Blood pressure 104/73, pulse 93, temperature 98.4 F (36.9 C), resp. rate 17, height 5' 11 (1.803 m), weight 63 kg, SpO2 96%.  Medical Problem List and Plan: 1. 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  Antithrombotics: -DVT/anticoagulation:  Pharmaceutical: Lovenox  40mg  daily             -antiplatelet therapy: ASA/Plavix  X 3 weeks followed by Plavix  alone 3. Pain Management: Tylenol  prn.  4. Mood/Behavior/Sleep: LCSW to follow for evaluation and support.             --hx of sundowning-->continue melatonin 3 mg/Monitor sleep hygeine             -antipsychotic agents: N/A -Reduced level of alertness trial amantadine - no significant changes will trial methylphenidate  10mg  BID starting noon 8/11 5. Neuropsych/cognition: This patient is not capable of making decisions on fully own behalf. 6. Skin/Wound Care:  Routine pressure relief measures.  7. Fluids/Electrolytes/Nutrition: Continue NPO due to holding of orals.              --on tube feeds.  8. HTN: Monitor BP TID--goal 150 to avoid symptoms (multiple ED visits this year)             --hx of chronic dizziness. Needs to stay hydrated.  -8/9-10/25 BP fine, monitor Vitals:   01/07/24 0505 01/07/24 1449 01/07/24 2007 01/08/24 0452  BP: (!) 133/96 (!) 134/94 107/81 98/76   01/08/24 1349 01/08/24 1951 01/09/24 1520 01/09/24 2049  BP:  110/81 (!) 125/91 118/87 119/80   01/10/24 0425 01/10/24 1522 01/10/24 1944 01/11/24 0501  BP: 95/68 112/78 129/86 104/73    9. H/o SVT/CHF s/p PPM/syncope: Monitor for orthostatic symptoms   10. BPH/urinary retention: Urecholine  and Proscar  cannot be crushed per pharmacy so will hold (cannot use Avodart  capsule due to TF).  -  01/09/24 PVR documented once, 66; monitor; of note, getting urocholine 10mg  TID... -01/10/24 low PVRs but not many documented  11. 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 Glaucoma: Resume home gtts Alphagan , Trusopt , Xalatan . 13. Dysphagia: NPO with tube feeds. May need PEG for nutritional support. BUN up will increase free H20 14. Acute renal failure: Improved from 24/1.36 to 27/0.99>> 30/0.89 15. Chronic Thrombocytopenia: stable      Latest Ref Rng & Units 01/11/2024    4:57 AM 01/07/2024    4:29 AM 01/05/2024    5:26 AM  CBC  WBC 4.0 - 10.5 K/uL 5.6  8.3  4.6   Hemoglobin 13.0 - 17.0 g/dL 84.7  84.5  84.8   Hematocrit 39.0 - 52.0 % 45.2  46.4  44.6   Platelets 150 - 400 K/uL 138  143  113      16.  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  UA + WBC but only rare bact, treat empirically keflex  500mg  BID x 3 d to see if this improves MS -No change in cognition noted   Urine bloody, has condom cath, will check cath UA C and S, off abx x 36h Hgb is normal , mildly low plt also on asa , plavix  and enoxaparin , will monitor  May need to hold enoxaparin  18. Hx of gout: allopurinol  100mg  daily   LOS: 7 days A FACE TO FACE EVALUATION WAS PERFORMED  Dustin Hamilton 01/11/2024, 10:24 AM

## 2024-01-11 NOTE — Progress Notes (Signed)
 Speech Language Pathology Daily Session Note  Patient Details  Name: Dustin Hamilton MRN: [id number] Date of Birth: 1941/04/11  Today's Date: 01/11/2024 SLP Individual Time: 1240-1325 SLP Individual Time Calculation (min): 45 min  Short Term Goals: Week 1: SLP Short Term Goal 1 (Week 1): Patient will demonstrate readiness for instrumental swallow evaluation SLP Short Term Goal 2 (Week 1): Patient will answer simple yes/no questions with 50% accuracy given max multimodal A SLP Short Term Goal 3 (Week 1): Patient will utilize gestures/verbalizations to communicate basic wants/needs with max multimodal A SLP Short Term Goal 4 (Week 1): Patient will sustain attention to functional tasks for 1 minute given max multimodal A  Skilled Therapeutic Interventions: Skilled therapy session focused on communication and dysphagia goals. Patient lethargic upon SLP entrance. SLP provided total A for patient to wipe face with cold washcloth. Patient verbalized name and good morning, though no other verbalizations present during session. SLP attempted to target expressive language goals through automatic speech tasks and melodic intonation. Patient with x1 oral approximations of numbers 1-5, however no vocalizations observed. SLP targeted dysphagia goals through providing thorough oral care and offering ice chips. Patient with consistent oral holding requiring mod-max verbal A to swallow. Patient with occasional anterior spillage though no s/sx of aspiration during all ice chips provided this date. Continue NPO w/ alternative means of nutrition, hydration and medication administration due to lethargy and presumed oropharyngeal deficits. Continue to monitor for MBS readiness. Patient left in bed with alarm set and call bell in reach. Continue POC.    Pain None visualized   Therapy/Group: Individual Therapy  Edan Juday M.A., CCC-SLP 01/11/2024, 7:46 AM

## 2024-01-12 DIAGNOSIS — F01B0 Vascular dementia, moderate, without behavioral disturbance, psychotic disturbance, mood disturbance, and anxiety: Secondary | ICD-10-CM | POA: Diagnosis not present

## 2024-01-12 DIAGNOSIS — I6381 Other cerebral infarction due to occlusion or stenosis of small artery: Secondary | ICD-10-CM | POA: Diagnosis not present

## 2024-01-12 DIAGNOSIS — R1312 Dysphagia, oropharyngeal phase: Secondary | ICD-10-CM | POA: Diagnosis not present

## 2024-01-12 LAB — GLUCOSE, CAPILLARY
Glucose-Capillary: 106 mg/dL — ABNORMAL HIGH (ref 70–99)
Glucose-Capillary: 154 mg/dL — ABNORMAL HIGH (ref 70–99)
Glucose-Capillary: 171 mg/dL — ABNORMAL HIGH (ref 70–99)
Glucose-Capillary: 179 mg/dL — ABNORMAL HIGH (ref 70–99)
Glucose-Capillary: 196 mg/dL — ABNORMAL HIGH (ref 70–99)

## 2024-01-12 NOTE — Progress Notes (Signed)
 Speech Language Pathology Daily Session Note  Patient Details  Name: KOLSTON LACOUNT MRN: [id number] Date of Birth: 1940-07-07  Today's Date: 01/12/2024 SLP Individual Time: 1430-1456 SLP Individual Time Calculation (min): 26 min  Short Term Goals: Week 2: SLP Short Term Goal 1 (Week 2): Patient will initiate pharyngeal swallow in 80% of trials given max multimodal A SLP Short Term Goal 2 (Week 2): Patient will follow single step commands in functional tasks given max multimodal A SLP Short Term Goal 3 (Week 2): Patient will verbalize single words in 50% of opportunities given max multimodal A SLP Short Term Goal 4 (Week 2): Patient will sustain attention during functional tasks for 20 seconds given max multimodal A  Skilled Therapeutic Interventions: SLP conducted skilled therapy session targeting communication goals. Patient demonstrated improvements in communication function this date, counting to 5 using his fingers given a visual model and mod hand over hand assist. Patient repeated after SLP to produce 'yes', 'no', and various sentence level productions. He answered 'yes' when asked if we were in a hospital but was unable to orient to date accurately using this yes/no system. In remaining minutes of session, patient sung along with familiar tune Kathryne Fellows with min assist. Patient was left in room with call bell in reach and alarm set. SLP will continue to target goals per plan of care.       Pain  None visualized  Therapy/Group: Individual Therapy  Rosina Downy, M.A., CCC-SLP  Shaterra Sanzone A Xaria Judon 01/12/2024, 3:00 PM

## 2024-01-12 NOTE — Progress Notes (Signed)
 Physical Therapy Session Note  Patient Details  Name: Dustin Hamilton MRN: [id number] Date of Birth: [birth year]  Today's Date: 01/12/2024 PT Individual Time: 1031-1100 PT Individual Time Calculation (min): 29 min   Short Term Goals: Week 1:  PT Short Term Goal 1 (Week 1): Pt will perform bed rolling with MaxA +1. PT Short Term Goal 2 (Week 1): Pt will perform supine<>sit with MaxA +1. PT Short Term Goal 3 (Week 1): Pt will perform sit<>stand with MaxA +2. PT Short Term Goal 4 (Week 1): Pt will maintain unsupported seated balance with MinA for more than 3 min. PT Short Term Goal 5 (Week 1): Pt will demonstrate improved initiation by following more than 50% of verbal instructions for mobility.  Skilled Therapeutic Interventions/Progress Updates:  Patient supine in bed on entrance to room but this session with eyes open. Patient alert and agreeable to PT session. Slightly more responsive throughout session than in previous sessions. Ritalin  increased yesterday. Is able to initiate high five with RUE. Brought focus to L hand and pt able to initiate lift of L hand from bed surface.   Patient with no pain complaint at start of session. Feeding tube running.   Therapeutic Activity: Bed Mobility: Pt performed supine <> sit with maxA to initiate. MaxA to bring BLE off EOB. Does demo improved activation with pt taking over bringing trunk forward and completes rise to sit with CGA/ MinA. VC/ tc required for technique and sequencing. Transfers: Pt performed sit<>stand transfers throughout session with MaxA +1 and CGA/ MinA from +2. Provided vc/ tc for technique and sequencing.  Neuromuscular Re-ed: NMR facilitated during session with focus on standing balance/ tolerance, motor control. Pt guided in two standing bouts requiring MaxA from PT and MinA from +2. Lateral weight shifting facilitated. Attempts to unblock L knee and pt able to maintain initially but then with motor impersistence with slow  flexion requiring blocking. Requires assist for glute squeeze and translating pelvis forward as well as shoulders back. Requires physical assist into limited trunk extension. Unable to reach full upright wither d/t tone or spinal contracture.  NMR performed for improvements in motor control and coordination, balance, sequencing, judgement, and self confidence/ efficacy in performing all aspects of mobility at highest level of independence.   Patient supine in bed at end of session with brakes locked, bed alarm set, and all needs within reach.   Therapy Documentation Precautions:  Precautions Precautions: Fall Recall of Precautions/Restrictions: Impaired Precaution/Restrictions Comments: L hemipareisis, NPO, CorTrak Restrictions Weight Bearing Restrictions Per Provider Order: No  Pain:  No pain related or demonstrated this session.   Therapy/Group: Individual Therapy  Mliss DELENA Milliner PT, DPT, CSRS 01/12/2024, 6:34 PM

## 2024-01-12 NOTE — Progress Notes (Signed)
 PROGRESS NOTE   Subjective/Complaints:  No issues overnite , spoke with wife yesterday in room.  Discussed probable longer term NPO status .  DIscussed comfort feeds with risk of PNA vs , PEG placement. She is leaning toward the latter   ROS- limited by cognition   Objective:   No results found. Recent Labs    01/11/24 0457  WBC 5.6  HGB 15.2  HCT 45.2  PLT 138*    Recent Labs    01/11/24 0457  NA 140  K 4.4  CL 106  CO2 27  GLUCOSE 178*  BUN 39*  CREATININE 1.01  CALCIUM  9.2     Intake/Output Summary (Last 24 hours) at 01/12/2024 0842 Last data filed at 01/12/2024 0801 Gross per 24 hour  Intake 750 ml  Output 1275 ml  Net -525 ml        Physical Exam: Vital Signs Blood pressure 128/78, pulse 81, temperature 98.2 F (36.8 C), temperature source Oral, resp. rate 19, height 5' 11 (1.803 m), weight 63 kg, SpO2 96%.  ENT- cortrak in nares  Masked facies  General: No acute distress, resting in bed. Mood and affect flat, doesn't want to open eyes much today Heart: Regular rate and rhythm no rubs murmurs or extra sounds Lungs: Clear to auscultation, breathing unlabored, no rales or wheezes Abdomen: Positive bowel sounds, soft nontender to palpation, nondistended Extremities: No clubbing, cyanosis, or edema Skin: No evidence of breakdown, no evidence of rash over exposed surfaces Neuro: follows very simple command (open eyes) but otherwise doesn't engage much Neurologic: masked facies, eyes closed , no spontaneous movement . motor strength is 4/5 in right deltoid, bicep, tricep, grip, 2-/5 LUE and LLE- MMT varies based on LOA - lost focus during LE MMT with limited attempt despite cuing  Sensory exam limited by attention and concentration  Cerebellar exam could not cooperate Musculoskeletal: no pain with hip or knee ROM BLE   Assessment/Plan: 1. Functional deficits which require 3+ hours per day of  interdisciplinary therapy in a comprehensive inpatient rehab setting. Physiatrist is providing close team supervision and 24 hour management of active medical problems listed below. Physiatrist and rehab team continue to assess barriers to discharge/monitor patient progress toward functional and medical goals  Care Tool:  Bathing    Body parts bathed by patient: Face   Body parts bathed by helper: Left arm, Abdomen, Chest, Right arm, Right upper leg, Left upper leg     Bathing assist Assist Level: 2 Helpers     Upper Body Dressing/Undressing Upper body dressing   What is the patient wearing?: Pull over shirt    Upper body assist Assist Level: 2 Helpers    Lower Body Dressing/Undressing Lower body dressing      What is the patient wearing?: Underwear/pull up, Pants     Lower body assist Assist for lower body dressing: 2 Helpers     Toileting Toileting    Toileting assist Assist for toileting: 2 Helpers     Transfers Chair/bed transfer  Transfers assist  Chair/bed transfer activity did not occur: Safety/medical concerns  Chair/bed transfer assist level: 2 Hospital doctor  assist   Ambulation activity did not occur: Safety/medical concerns          Walk 10 feet activity   Assist  Walk 10 feet activity did not occur: Safety/medical concerns        Walk 50 feet activity   Assist Walk 50 feet with 2 turns activity did not occur: Safety/medical concerns         Walk 150 feet activity   Assist Walk 150 feet activity did not occur: Safety/medical concerns         Walk 10 feet on uneven surface  activity   Assist Walk 10 feet on uneven surfaces activity did not occur: Safety/medical concerns         Wheelchair     Assist Is the patient using a wheelchair?: Yes (did not use during eval but is expected to use throughout stay and will use on d/c) Type of Wheelchair: Manual Wheelchair activity did  not occur: Safety/medical concerns         Wheelchair 50 feet with 2 turns activity    Assist    Wheelchair 50 feet with 2 turns activity did not occur: Safety/medical concerns       Wheelchair 150 feet activity     Assist  Wheelchair 150 feet activity did not occur: Safety/medical concerns       Blood pressure 128/78, pulse 81, temperature 98.2 F (36.8 C), temperature source Oral, resp. rate 19, height 5' 11 (1.803 m), weight 63 kg, SpO2 96%.  Medical Problem List and Plan: 1. 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  Antithrombotics: -DVT/anticoagulation:  Pharmaceutical: Lovenox  40mg  daily             -antiplatelet therapy: ASA/Plavix  X 3 weeks followed by Plavix  alone 3. Pain Management: Tylenol  prn.  4. Mood/Behavior/Sleep: LCSW to follow for evaluation and support.             --hx of sundowning-->continue melatonin 3 mg/Monitor sleep hygeine             -antipsychotic agents: N/A -Reduced level of alertness trial amantadine - no significant changes will trial methylphenidate  10mg  BID starting noon 8/11 5. Neuropsych/cognition: This patient is not capable of making decisions  6. Skin/Wound Care:  Routine pressure relief measures.  7. Fluids/Electrolytes/Nutrition: Continue NPO due to holding of orals.              --on tube feeds.  8. HTN: Monitor BP TID--goal 150 to avoid symptoms (multiple ED visits this year)             --hx of chronic dizziness. Needs to stay hydrated.  -8/12, controlled  Vitals:   01/08/24 0452 01/08/24 1349 01/08/24 1951 01/09/24 1520  BP: 98/76 110/81 (!) 125/91 118/87    01/09/24 2049 01/10/24 0425 01/10/24 1522 01/10/24 1944  BP: 119/80 95/68 112/78 129/86   01/11/24 0501 01/11/24 1241 01/11/24 1937 01/12/24 0406  BP: 104/73 122/83 129/89 128/78    9. H/o SVT/CHF s/p PPM/syncope: Monitor for orthostatic symptoms   10. BPH/urinary  retention: Urecholine  and Proscar  cannot be crushed per pharmacy so will hold (cannot use Avodart  capsule due to TF).  -01/09/24 PVR documented once, 66; monitor; of note, getting urocholine 10mg  TID... -01/10/24 low PVRs but not many documented  11. 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 Glaucoma: Resume home gtts Alphagan , Trusopt , Xalatan . 13. Dysphagia: NPO with tube feeds. May need PEG for nutritional support. BUN up will increase free H20 14. Acute renal failure: Improved from 24/1.36 to 27/0.99>> 30/0.89 15. Chronic Thrombocytopenia: stable      Latest Ref Rng & Units 01/11/2024    4:57 AM 01/07/2024    4:29 AM 01/05/2024    5:26 AM  CBC  WBC 4.0 - 10.5 K/uL 5.6  8.3  4.6   Hemoglobin 13.0 - 17.0 g/dL 84.7  84.5  84.8   Hematocrit 39.0 - 52.0 % 45.2  46.4  44.6   Platelets 150 - 400 K/uL 138  143  113      16.  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  UA + WBC but only rare bact, treat empirically keflex  500mg  BID x 3 d to see if this improves MS -No change in cognition noted   Urine blood tinged yesterday , monitor Hgb ok  Hgb is normal , mildly low plt also on asa , plavix  and enoxaparin , will monitor  May need to hold enoxaparin  18. Hx of gout: allopurinol  100mg  daily   LOS: 8 days A FACE TO FACE EVALUATION WAS PERFORMED  Dustin Hamilton 01/12/2024, 8:42 AM

## 2024-01-12 NOTE — Progress Notes (Signed)
 Speech Language Pathology Weekly Progress and Session Note  Patient Details  Name: Dustin Hamilton MRN: [id number] Date of Birth: 10-26-1940  Beginning of progress report period: January 05, 2024 End of progress report period: January 12, 2024  Today's Date: 01/12/2024 SLP Individual Time: 8874-8844 SLP Individual Time Calculation (min): 30 min  Short Term Goals: Week 1: SLP Short Term Goal 1 (Week 1): Patient will demonstrate readiness for instrumental swallow evaluation SLP Short Term Goal 1 - Progress (Week 1): Not met SLP Short Term Goal 2 (Week 1): Patient will answer simple yes/no questions with 50% accuracy given max multimodal A SLP Short Term Goal 2 - Progress (Week 1): Not met SLP Short Term Goal 3 (Week 1): Patient will utilize gestures/verbalizations to communicate basic wants/needs with max multimodal A SLP Short Term Goal 3 - Progress (Week 1): Not met SLP Short Term Goal 4 (Week 1): Patient will sustain attention to functional tasks for 1 minute given max multimodal A SLP Short Term Goal 4 - Progress (Week 1): Not met    New Short Term Goals: Week 2: SLP Short Term Goal 1 (Week 2): Patient will initiate pharyngeal swallow in 80% of trials given max multimodal A SLP Short Term Goal 2 (Week 2): Patient will follow single step commands in functional tasks given max multimodal A SLP Short Term Goal 3 (Week 2): Patient will verbalize single words in 50% of opportunities given max multimodal A SLP Short Term Goal 4 (Week 2): Patient will sustain attention during functional tasks for 20 seconds given max multimodal A  Weekly Progress Updates: Pt has made limited gains and has met 0 of 4 STGs this reporting period due to significant lethargy, inattention and poor ability to participate in skilled therapy tasks. Currently, patient continues to require total A for all expressive/receptive communication as patient with limited-no verbalizations nor responses when prompted to gesture  yes/no. Patient continues to demonstrate significantly impaired attention requiring total A to demonstrate eye contact on the R. Patient continues to demonstrate s/sx of significant dysphagia characterized by poor bolus manipulation, consistent s/sx of aspiration and requiring maxA to initiate pharyngeal swallow.  Pt/family eduction ongoing. Pt would benefit from continued ST intervention to maximize communication, cognition and dysphagia in order to maximize functional independence at d/c.    Intensity: Minumum of 1-2 x/day, 30 to 90 minutes Frequency: 3 to 5 out of 7 days Duration/Length of Stay: 4 weeks Treatment/Interventions: Cognitive remediation/compensation;Cueing hierarchy;Dysphagia/aspiration precaution training;Functional tasks;Internal/external aids;Multimodal communication approach;Oral motor exercises;Patient/family education;Speech/Language facilitation;Therapeutic Activities   Daily Session  Skilled Therapeutic Interventions:  Skilled therapy session focused on communication and dysphagia goals. Patient awake upon SLP entrance, though with poor attention to task requiring total A to make eye contact with SLP. SLP attempted to target receptive language through simple yes/no questions. Patient with occasional gestural response given maxA, though perseverative on 'no'. SLP attempted use of communication board and thumbs up/down, though this was unsuccessful. SLP targeted expressive language through prompting verbalization of automatic speech tasks (name, numbers). Patient with no attempts at verbalization, only utilizing fingers to count 1-5. Lastly, SLP targeted dysphagia goals. SLP provided oral care via suction toothbrush and offered ice chips. Patient with pharyngeal swallow initiation in ~40% of trials, requiring suctioning on remainder. Patient with x1 immediate cough likely due to bolus misdirection. Recommend continuation of NPO w/ alternative means of nutrition, hydration and  medication administration. Patient is not appropriate for an instrumental swallow evaluation at this time given lethargy, reduced oral control and  pharyngeal initiation. Patient left in bed with alarm set and call bell in reach. Continue POC.     Pain None visualized   Therapy/Group: Individual Therapy  Kanai Hilger M.A., CCC-SLP 01/12/2024, 12:05 PM

## 2024-01-12 NOTE — Progress Notes (Signed)
 Occupational Therapy Weekly Progress Note  Patient Details  Name: Dustin Hamilton MRN: [id number] Date of Birth: June 03, 1940  Beginning of progress report period: January 08, 2024 End of progress report period: January 12, 2024  Today's Date: 01/12/2024 OT Individual Time: 1348-1430 OT Individual Time Calculation (min): 42 min    Patient has met 0 of 4 short term goals. Mr. Crampton is slowly progressing towards reaching his LTG. During this past week, Mr. Sakuma has been most limit by lethargy and fatigue during therapy sessions. He is noted to participate better in therapy sessions in PM vs AM and when his wife or family is present to support participation. Currently, Mr. Rood required max-total A for all ADLs. He required max multimodal cues for task initiation, attention, and awareness and increased time for processing during ADLs. He has demonstrated improved alertness with increased amount of time with his eyes open during past few days. [name] family is very supportive and is often present for PM therapy sessions. He is planning to d/c home with his wife and 24/7 family support at this time. Will continue to discuss safest d/c plan based on [name] progress during LOS.  Patient continues to demonstrate the following deficits: muscle weakness and muscle joint tightness, decreased cardiorespiratoy endurance, impaired timing and sequencing, abnormal tone, unbalanced muscle activation, decreased coordination, and decreased motor planning, decreased visual perceptual skills, decreased visual motor skills, and field cut, decreased midline orientation, decreased attention to left, and decreased motor planning, decreased initiation, decreased attention, decreased awareness, decreased problem solving, decreased safety awareness, decreased memory, and delayed processing, central origin, and decreased sitting balance, decreased standing balance, decreased postural control, hemiplegia, and decreased balance  strategies and therefore will continue to benefit from skilled OT intervention to enhance overall performance with BADL and Reduce care partner burden.  Patient progressing toward long term goals..  Continue plan of care.  OT Short Term Goals Week 1:  OT Short Term Goal 1 (Week 1): Pt will follow single step command during ADLs with verbal cues only OT Short Term Goal 1 - Progress (Week 1): Not met OT Short Term Goal 2 (Week 1): Pt will locate 5/5 obejcts in L visual field with SUP OT Short Term Goal 2 - Progress (Week 1): Not met OT Short Term Goal 3 (Week 1): Pt will maintain sitting balance in preparation for functional transfes with CGA OT Short Term Goal 3 - Progress (Week 1): Not met OT Short Term Goal 4 (Week 1): Pt will complete one step of dressing task with SUP OT Short Term Goal 4 - Progress (Week 1): Not met Week 2:  OT Short Term Goal 1 (Week 2): Pt will follow single step command during ADL with MOD multimodal cues OT Short Term Goal 2 (Week 2): Pt will tolerate sitting up in wc between therapy session 3/7 days OT Short Term Goal 3 (Week 2): Pt will maintain attention to familiar ADL for 30 sec with SUP OT Short Term Goal 4 (Week 2): Pt will maintain sitting balance EOB in preparation for functional transfer with MIN A consistently  Skilled Therapeutic Interventions/Progress Updates:     Pt received sitting up in bed with eyes open upon OT arrival. Pt presenting to be more alert compared to previous OT sessions, receptive to skilled OT session reporting 0/10 pain- OT offering intermittent rest breaks, repositioning, and therapeutic support to optimize participation in therapy session. He did respond more consistently to OT this session stating yes ma'am, I am not having  pain, and I am doing okay, how are you. Pt able to initiate lifting R LE to bring legs towards EOB during bed mobility requiring MAX A overall. Once sitting EOB, Pt requiring MAX A to maintain sitting balance.  Pts trunk and neck very rigid with increased tone. Worked on L side attention and task initiation sitting EOB. Pt able to locate his L UE with MIN HOH +increased time and MAX multimodal cues. With hands clasp, engaged Pt in completing self AAROM bringing hands to chin and completing shoulder flexion to ~60*, OT providing MIN A for L UE stabilization. Pt noted to be able to make fist with L hand with increased time following verbal cues. Tone noted in L elbow limiting fully elbow extension on L, however Pt able to achieve -160* elbow extension with gentle prolonged stretching and application of vibration to relax bicep muscle. Worked on anterior weight shifting with Pt reaching towards target with R UE- significant undershooting noted and MAX A required to anteriorly flex trunk. Utilized STEDY to work on sit<>stands and to facilitate WB'ing through B LEs. He did initiate reaching for grab bar with R UE following mod verbal cues +min A in preparation for stand. MAX A +2 required for sit<>stands and to maintain upright standing position- decreased hip extension and poor head control noted in standing position. Pt fatigued following, returned to supine MAX A +2. Pt was left resting in bed with call bell in reach and all needs met.    Therapy Documentation Precautions:  Precautions Precautions: Fall Recall of Precautions/Restrictions: Impaired Precaution/Restrictions Comments: L hemipareisis, NPO, CorTrak Restrictions Weight Bearing Restrictions Per Provider Order: No   Therapy/Group: Individual Therapy  Katheryn SHAUNNA Mines 01/12/2024, 8:01 AM

## 2024-01-12 NOTE — Plan of Care (Signed)
 Goals downgraded due to patients significant lethargy and reduced ability to participate in therapeutic tasks Problem: RH Comprehension Communication Goal: LTG Patient will comprehend basic/complex auditory (SLP) Description: LTG: Patient will comprehend basic/complex auditory information with cues (SLP). Flowsheets Taken 01/12/2024 1224 LTG: Patient will comprehend auditory information with cueing (SLP): Maximal Assistance - Patient 25 - 49% Taken 01/05/2024 1226 LTG: Patient will comprehend: Basic auditory information   Problem: RH Expression Communication Goal: LTG Patient will express needs/wants via multi-modal(SLP) Description: LTG:  Patient will express needs/wants via multi-modal communication (gestures/written, etc) with cues (SLP) Flowsheets (Taken 01/12/2024 1224) LTG: Patient will express needs/wants via multimodal communication (gestures/written, etc) with cueing (SLP): Maximal Assistance - Patient 25 - 49%   Problem: RH Attention Goal: LTG Patient will demonstrate this level of attention during functional activites (SLP) Description: LTG:  Patient will will demonstrate this level of attention during functional activites (SLP) Flowsheets (Taken 01/12/2024 1224) LTG: Patient will demonstrate this level of attention during cognitive/linguistic activities with assistance of (SLP): Maximal Assistance - Patient 25 - 49% Number of minutes patient will demonstrate attention during cognitive/linguistic activities: 1   Problem: RH Expression Communication Goal: LTG Patient will increase speech intelligibility (SLP) Description: LTG: Patient will increase speech intelligibility at word/phrase/conversation level with cues, % of the time (SLP) Outcome: Not Applicable

## 2024-01-13 ENCOUNTER — Inpatient Hospital Stay (HOSPITAL_COMMUNITY)

## 2024-01-13 DIAGNOSIS — I6381 Other cerebral infarction due to occlusion or stenosis of small artery: Secondary | ICD-10-CM | POA: Diagnosis not present

## 2024-01-13 DIAGNOSIS — F01B0 Vascular dementia, moderate, without behavioral disturbance, psychotic disturbance, mood disturbance, and anxiety: Secondary | ICD-10-CM | POA: Diagnosis not present

## 2024-01-13 DIAGNOSIS — R1312 Dysphagia, oropharyngeal phase: Secondary | ICD-10-CM | POA: Diagnosis not present

## 2024-01-13 LAB — GLUCOSE, CAPILLARY
Glucose-Capillary: 173 mg/dL — ABNORMAL HIGH (ref 70–99)
Glucose-Capillary: 168 mg/dL — ABNORMAL HIGH (ref 70–99)
Glucose-Capillary: 162 mg/dL — ABNORMAL HIGH (ref 70–99)
Glucose-Capillary: 170 mg/dL — ABNORMAL HIGH (ref 70–99)
Glucose-Capillary: 157 mg/dL — ABNORMAL HIGH (ref 70–99)
Glucose-Capillary: 119 mg/dL — ABNORMAL HIGH (ref 70–99)

## 2024-01-13 NOTE — Progress Notes (Signed)
 Physical Therapy Weekly Progress Note  Patient Details  Name: Dustin Hamilton MRN: [id number] Date of Birth: 1940-10-07  Beginning of progress report period: January 05, 2024 End of progress report period: January 13, 2024  Today's Date: 01/13/2024 PT Individual Time: 9095-9064 PT Individual Time Calculation (min): 31 min   Patient has met 1 of 5 short term goals.  Pt making very slow progress towards goals and reduced awareness and initiation to participate. Reduced cognition with increased trunkal tone also play role in lack of progression. Participates in scheduled therapy sessions with head nod or verbal yes when asked to participate, however very little initiation with vc/ tc to start all movements. Continues to require complete physical assist to complete bed mobility d/t reduced volitional movement, functional transfers with DEP/ TotA, has ambulated with TotA +2 using 3 musketeer technique.   Will not likely progress to stair training. May need TIS w/c for home use. Limited by low activity tolerance, reduced cognition, limited awareness, reduced alertness, low and limited volitional mobility and generalized weakness with increased tone. No one has yet to participate in family education to prepare for d/c home.    Patient continues to demonstrate the following deficits muscle weakness and muscle joint tightness, decreased cardiorespiratoy endurance, abnormal tone, decreased coordination, and decreased motor planning, decreased midline orientation, left side neglect, and decreased motor planning, decreased initiation, decreased attention, decreased awareness, decreased problem solving, decreased safety awareness, decreased memory, and delayed processing, and decreased sitting balance, decreased standing balance, decreased postural control, hemiplegia, and decreased balance strategies and therefore will continue to benefit from skilled PT intervention to increase functional independence with  mobility.  Patient not progressing toward long term goals.  See goal revision..  Plan of care revisions: LTGs regressed d/t limited progression.  PT Short Term Goals Week 1:  PT Short Term Goal 1 (Week 1): Pt will perform bed rolling with MaxA +1. PT Short Term Goal 1 - Progress (Week 1): Not met PT Short Term Goal 2 (Week 1): Pt will perform supine<>sit with MaxA +1. PT Short Term Goal 2 - Progress (Week 1): Not met PT Short Term Goal 3 (Week 1): Pt will perform sit<>stand with MaxA +2. PT Short Term Goal 3 - Progress (Week 1): Met PT Short Term Goal 4 (Week 1): Pt will maintain unsupported seated balance with MinA for more than 3 min. PT Short Term Goal 4 - Progress (Week 1): Progressing toward goal PT Short Term Goal 5 (Week 1): Pt will demonstrate improved initiation by following more than 50% of verbal instructions for mobility. PT Short Term Goal 5 - Progress (Week 1): Progressing toward goal Week 2:  PT Short Term Goal 1 (Week 2): Pt will complete bed mobility with MaxA +1. PT Short Term Goal 2 (Week 2): Pt will perform sit<>stand with MaxA +1. PT Short Term Goal 3 (Week 2): Pt will perform seat to seat pivot transfers with MaxA +2. PT Short Term Goal 4 (Week 2): Pt will maintain sitting balance with vc and self corrections and overall MinA for more than .  Skilled Therapeutic Interventions/Progress Updates:  Ambulation/gait training;DME/adaptive equipment instruction;Neuromuscular re-education;Psychosocial support;Stair training;UE/LE Strength taining/ROM;Wheelchair propulsion/positioning;Balance/vestibular training;Discharge planning;Functional electrical stimulation;Therapeutic Activities;UE/LE Coordination activities;Visual/perceptual remediation/compensation;Therapeutic Exercise;Splinting/orthotics;Patient/family education;Functional mobility training;Disease management/prevention;Cognitive remediation/compensation;Skin care/wound management  Skilled Intervention: Patient  supine in bed on entrance to room. Patient initially mildly alert. Woken by voice and agreeable to PT session.   Patient with no pain complaint at start of session.Feels very warm from air mattress and  cold washcloth used to wipe face, neck, chest, arms and legs for improved overall temperature and comfort.   Provided PROM throughout body. Started with neck but pt feels contractured d/t increased tone in neck and trunk. BUE ranged at shoulder, elbow, wrist and fingers through available ROM. BLE also ranged at hip, knee, and ankles through available ROM. Pt demos some spasticity in elbow ROM. Minimal activity noted in R>L knee/ hip extension.   Patient supine in bed at end of session with brakes locked, bed alarm set, and all needs within reach. Condom catheter noted to have kink in end of condom that prevents flow of urine away from penis. Pt is (+) for UTI and NT notified as to need for open line for ease of flow into collection bag.    Therapy Documentation Precautions:  Precautions Precautions: Fall Recall of Precautions/Restrictions: Impaired Precaution/Restrictions Comments: L hemipareisis, NPO, CorTrak Restrictions Weight Bearing Restrictions Per Provider Order: No  Pain:  No pain noted this session.   Therapy/Group: Individual Therapy  Mliss DELENA Milliner 01/13/2024, 6:13 PM

## 2024-01-13 NOTE — Progress Notes (Signed)
 Patient ID: [name], male   DOB: Aug 02, 1940, 83 y.o.   MRN: [id number]  Met with pt and wife while in speech therapy. Wife has brought the pacemaker monitor and plugged into socket so can be monitored. Discussed team conference progress and the plan to move forward and work on home with target discharge date of 8/21. Wife feels the ritalin  to kick in more and once can speak better will then move. Discussed he is not making much progress with moving and needs to be able to, to continue justification for stay here on CIR. She reports she spoke with MD and he explained it and discussed PEG tube needed, which she has agreed too. Discussed if the plan is for home will need to being working on equipment needs due to [facility] takes time to obtain the equipment and other needed items. She gave worker the CM at [facility] she works with and her number. She asked who she would need to get hire wise. She would need a CNA a person who can transfer and provide his care needs. Will call CM at [facility] and work on equipment and seeing if can get more aide hours. Continue to work on discharge needs.

## 2024-01-13 NOTE — Patient Care Conference (Signed)
 Inpatient RehabilitationTeam Conference and Plan of Care Update Date: 01/13/2024   Time: 10:40 AM    Patient Name: Dustin Hamilton      Medical Record Number: [id number]  Date of Birth: 1940/09/03 Sex: Male         Room/Bed: 5T95R/5T95R-98 Payor Info: Payor: [employer] / Plan: [facility] / Product Type: *No Product type* /    Admit Date/Time:  01/04/2024  3:04 PM  Primary Diagnosis:  Stroke of right basal ganglia [facility])  Hospital Problems: Principal Problem:   Stroke of right basal ganglia [facility])    Expected Discharge Date: Expected Discharge Date: 01/21/24  Team Members Present: Physician leading conference: Dr. Prentice Compton Social Worker Present: Rhoda Clement, LCSW Nurse Present: Barnie Ronde, RN PT Present: Recardo Milliner, PT OT Present: Katheryn Mines, OT SLP Present: Blaise Alderman, SLP PPS Coordinator present : Eleanor Colon, SLP     Current Status/Progress Goal Weekly Team Focus  Bowel/Bladder   Incontinent B/B; LBM 01/12/2024   Regain continence   Assisting with toileting q4hrs    Swallow/Nutrition/ Hydration   NPO   mod  PO trials, pharyngeal strengthening as able to participate, MBS readiness    ADL's   Continues at MAX A +2 for ADLs sitting EOB // Barriers- lethargy, mild improvement in alertness in PM however continues to have significantly delayed initation, slowed processing, decreased attention, and poor awareness; some movement against gravity noted in L bicep/tricep   mod A   increasin alertness, sitting balance, NMR, family education, following single step commands, task initiation, familiar functional activities    Mobility   Continues at MaxA +2/ TotA for all mobility   ModA overall - may need to downgrade  Barriers: lethargy, hemiplegia /// Work on: all mobility LOA, alertness, seated balance, NMR, family education    Communication   severe expressive/receptive lang   mod   multimodal means of communication,  yes/no, single step commands    Safety/Cognition/ Behavioral Observations  severe attention deficits   mod   attention to task, participation    Pain   No complaints of pain   Will be free from pain   Assess pain every shift or as needed    Skin   Skin intact   Will maintain skin integrity  Assess skin every shift and as needed      Discharge Planning:  Wife is planning on taking pt home and lining up caregivers, but not sure can provide the level of care he will need. He has been to a SNF in the past and may need to go again if wife can not get the care needed for him. 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 Progress limited by limited alertness, tracking deficits, unable to move from midline, and unable to follow instructions.   Patient on target to meet rehab goals: no, goals for discharge downgraded to max assist overall. Patient limited by chronic cognitive issues and worsening dysphagia; PEG placement recommended for nutritional means. Needs max assist + 2 for ADLs due to Parkinsonisms and truncal tone.  Needs max assist + 2 for mobility.  Continue with 15/7 therapy schedule.  *See Care Plan and progress notes for long and short-term goals.   Revisions to Treatment Plan:  Neuro stimulator trial Neurology consult Wheelchair eval  Teaching Needs: Safety, medications, skin care, nutritional means/PEG care/Administration of medications/fluids via PEG, transfers, toileting, etc.   Current Barriers to Discharge: Decreased caregiver support, Home enviroment access/layout, and Incontinence  Possible Resolutions to Barriers: Family education     Medical Summary Current Status: NPO but no sign of pneumonia, chronic cognitive and mobility issues, Left hemiparesis  UE>LE  Barriers to Discharge: Medical stability   Possible Resolutions to [employer] Focus: Cont trial of neurostimulants, IR consult for PEG   Continued Need for Acute Rehabilitation Level of Care: The patient requires daily medical management by a physician with specialized training in physical medicine and rehabilitation for the following reasons: Direction of a multidisciplinary physical rehabilitation program to maximize functional independence : Yes Medical management of patient stability for increased activity during participation in an intensive rehabilitation regime.: Yes Analysis of laboratory values and/or radiology reports with any subsequent need for medication adjustment and/or medical intervention. : Yes   I attest that I was present, lead the team conference, and concur with the assessment and plan of the team.   Fredericka Sober B 01/13/2024, 3:32 PM

## 2024-01-13 NOTE — Progress Notes (Addendum)
 PROGRESS NOTE   Subjective/Complaints: CHronic cognitive deficits with left neglect now with further decline in swallowing and worsened left neglect  Discussed probable longer term NPO status .  DIscussed comfort feeds with risk of PNA vs , PEG placement. Wife would like PEG, discussed today as well as on 8/11 Discussed need to stop plavix  and lovenox  for procedure  ROS- limited by cognition   Objective:   No results found. Recent Labs    01/11/24 0457  WBC 5.6  HGB 15.2  HCT 45.2  PLT 138*    Recent Labs    01/11/24 0457  NA 140  K 4.4  CL 106  CO2 27  GLUCOSE 178*  BUN 39*  CREATININE 1.01  CALCIUM  9.2     Intake/Output Summary (Last 24 hours) at 01/13/2024 1045 Last data filed at 01/13/2024 0951 Gross per 24 hour  Intake 800 ml  Output 1325 ml  Net -525 ml        Physical Exam: Vital Signs Blood pressure 129/82, pulse 80, temperature 97.7 F (36.5 C), resp. rate 18, height 5' 11 (1.803 m), weight 63 kg, SpO2 98%.  ENT- cortrak in nares  Masked facies  General: No acute distress, resting in bed. Mood and affect flat, doesn't want to open eyes much today Heart: Regular rate and rhythm no rubs murmurs or extra sounds Lungs: Clear to auscultation, breathing unlabored, no rales or wheezes Abdomen: Positive bowel sounds, soft nontender to palpation, nondistended Extremities: No clubbing, cyanosis, or edema Skin: No evidence of breakdown, no evidence of rash over exposed surfaces Neuro: follows very simple command (open eyes) but otherwise doesn't engage much Neurologic: masked facies, eyes closed , no spontaneous movement . motor strength is 4/5 in right deltoid, bicep, tricep, grip, 2-/5 LUE and LLE- MMT varies based on LOA - lost focus during LE MMT with limited attempt despite cuing  Sensory exam limited by attention and concentration  Cerebellar exam could not cooperate Musculoskeletal: no pain  with hip or knee ROM BLE   Assessment/Plan: 1. Functional deficits which require 3+ hours per day of interdisciplinary therapy in a comprehensive inpatient rehab setting. Physiatrist is providing close team supervision and 24 hour management of active medical problems listed below. Physiatrist and rehab team continue to assess barriers to discharge/monitor patient progress toward functional and medical goals  Care Tool:  Bathing    Body parts bathed by patient: Face   Body parts bathed by helper: Left arm, Abdomen, Chest, Right arm, Right upper leg, Left upper leg     Bathing assist Assist Level: 2 Helpers     Upper Body Dressing/Undressing Upper body dressing   What is the patient wearing?: Pull over shirt    Upper body assist Assist Level: 2 Helpers    Lower Body Dressing/Undressing Lower body dressing      What is the patient wearing?: Underwear/pull up, Pants     Lower body assist Assist for lower body dressing: 2 Helpers     Toileting Toileting    Toileting assist Assist for toileting: 2 Helpers     Transfers Chair/bed transfer  Transfers assist  Chair/bed transfer activity did not occur: Safety/medical concerns  Chair/bed transfer assist level: 2 Helpers     Locomotion Ambulation   Ambulation assist   Ambulation activity did not occur: Safety/medical concerns          Walk 10 feet activity   Assist  Walk 10 feet activity did not occur: Safety/medical concerns        Walk 50 feet activity   Assist Walk 50 feet with 2 turns activity did not occur: Safety/medical concerns         Walk 150 feet activity   Assist Walk 150 feet activity did not occur: Safety/medical concerns         Walk 10 feet on uneven surface  activity   Assist Walk 10 feet on uneven surfaces activity did not occur: Safety/medical concerns         Wheelchair     Assist Is the patient using a wheelchair?: Yes (did not use during eval but is  expected to use throughout stay and will use on d/c) Type of Wheelchair: Manual Wheelchair activity did not occur: Safety/medical concerns         Wheelchair 50 feet with 2 turns activity    Assist    Wheelchair 50 feet with 2 turns activity did not occur: Safety/medical concerns       Wheelchair 150 feet activity     Assist  Wheelchair 150 feet activity did not occur: Safety/medical concerns       Blood pressure 129/82, pulse 80, temperature 97.7 F (36.5 C), resp. rate 18, height 5' 11 (1.803 m), weight 63 kg, SpO2 98%.  Medical Problem List and Plan: 1. 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  Antithrombotics: -DVT/anticoagulation:  Pharmaceutical: Lovenox  40mg  daily             -antiplatelet therapy: ASA/Plavix  X 3 weeks followed by Plavix  alone 3. Pain Management: Tylenol  prn.  4. Mood/Behavior/Sleep: LCSW to follow for evaluation and support.             --hx of sundowning-->continue melatonin 3 mg/Monitor sleep hygeine             -antipsychotic agents: N/A -Reduced level of alertness trial amantadine - no significant changes will trial methylphenidate  10mg  BID starting noon 8/11 5. Neuropsych/cognition: This patient is not capable of making decisions  6. Skin/Wound Care:  Routine pressure relief measures.  7. Fluids/Electrolytes/Nutrition: Continue NPO due to holding of orals.              --on tube feeds.  8. HTN: Monitor BP TID--goal 150 to avoid symptoms (multiple ED visits this year)             --hx of chronic dizziness. Needs to stay hydrated.  -8/12, controlled  Vitals:   01/09/24 1520 01/09/24 2049 01/10/24 0425 01/10/24 1522  BP: 118/87 119/80 95/68 112/78   01/10/24  1944 01/11/24 0501 01/11/24 1241 01/11/24 1937  BP: 129/86 104/73 122/83 129/89   01/12/24 0406 01/12/24 1300 01/12/24 2030 01/13/24 0529  BP: 128/78 112/82 133/82 129/82    9. H/o SVT/CHF s/p PPM/syncope: Monitor for orthostatic symptoms   10. BPH/urinary retention: Urecholine  and Proscar  cannot be  crushed per pharmacy so will hold (cannot use Avodart  capsule due to TF).  -01/09/24 PVR documented once, 66; monitor; of note, getting urocholine 10mg  TID... -01/10/24 low PVRs but not many documented  11. 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 Glaucoma: Resume home gtts Alphagan , Trusopt , Xalatan . 13. Dysphagia: NPO with tube feeds. May need PEG for nutritional support. BUN up will increase free H20 14. Acute renal failure: Improved from 24/1.36 to 27/0.99>> 30/0.89 15. Chronic Thrombocytopenia: stable      Latest Ref Rng & Units 01/11/2024    4:57 AM 01/07/2024    4:29 AM 01/05/2024    5:26 AM  CBC  WBC 4.0 - 10.5 K/uL 5.6  8.3  4.6   Hemoglobin 13.0 - 17.0 g/dL 84.7  84.5  84.8   Hematocrit 39.0 - 52.0 % 45.2  46.4  44.6   Platelets 150 - 400 K/uL 138  143  113      16.  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  UA + WBC but only rare bact, treat empirically keflex  500mg  BID x 3 d to see if this improves MS -No change in cognition noted   Urine blood tinged yesterday , monitor Hgb ok  Hgb is normal , mildly low plt also on asa , plavix  and enoxaparin , will monitor  May need to hold enoxaparin  18. Hx of gout: allopurinol  100mg  daily   LOS: 9 days A FACE TO FACE EVALUATION WAS PERFORMED  Prentice FORBES Compton 01/13/2024, 10:45 AM

## 2024-01-13 NOTE — Progress Notes (Signed)
 Occupational Therapy Session Note  Patient Details  Name: Dustin Hamilton MRN: [id number] Date of Birth: [birth year]  Today's Date: 01/13/2024 OT Individual Time: 8897-8852 OT Individual Time Calculation (min): 45 min  and Today's Date: 01/13/2024 OT Missed Time: 15 Minutes Missed Time Reason: Patient fatigue   Short Term Goals: Week 2:  OT Short Term Goal 1 (Week 2): Pt will follow single step command during ADL with MOD multimodal cues OT Short Term Goal 2 (Week 2): Pt will tolerate sitting up in wc between therapy session 3/7 days OT Short Term Goal 3 (Week 2): Pt will maintain attention to familiar ADL for 30 sec with SUP OT Short Term Goal 4 (Week 2): Pt will maintain sitting balance EOB in preparation for functional transfer with MIN A consistently  Skilled Therapeutic Interventions/Progress Updates:     Pt received sitting up in bed lightly sleeping, waking upon OT arrival with OT calling out [name] name. Pt verbally responding to OT good morning, how are you, however making no other verbal response during remainder of session. Pt receptive to skilled OT session reporting 0/10 pain- OT offering intermittent rest breaks, repositioning, and therapeutic support to optimize participation in therapy session. Pt maintaining eyes open this session, however demonstrating minimal visual attention to task with continued slowed task initiation and delayed processing. Pt transitioned to EOB with MAX +2 with HOB elevated- Pt able to scoot R LE x2 trials to bring it to EOB to participate in bed mobility. MAX required for sitting balance once EOB. Worked on following single step commands and participating in familiar ADLs. Pt able to bring R hand to chin to wash face following verbal/tactile cues and demonstration +increased time- increased challenges motor planning to bring wash cloth to eyes or forehead. MOD HOH A required to locate L UE and wash chest, stomach, and L UE. Worked on anterior functional  reaching for increased sitting balance challenge and to facilitate anterior weight shifting, minimal participation in this though. Pt attempt to reach towards target x2 trials requiring HOH to complete task and then did not participate any further. Engaged Pt in completing gentle AAROM of B UE bringing hands to chin to increase L side attention and functional movement patterns with MOD HOH A required to complete. Pt presenting to bed fatigued. Returned to bed MAX Ax2. Pt was left resting in bed with call bell in reach and all needs met.    Therapy Documentation Precautions:  Precautions Precautions: Fall Recall of Precautions/Restrictions: Impaired Precaution/Restrictions Comments: L hemipareisis, NPO, CorTrak Restrictions Weight Bearing Restrictions Per Provider Order: No   Therapy/Group: Individual Therapy  Katheryn SHAUNNA [place]ines 01/13/2024, 7:56 AM

## 2024-01-13 NOTE — Progress Notes (Signed)
 Occupational Therapy Session Note  Patient Details  Name: Dustin Hamilton MRN: [id number] Date of Birth: Jul 27, 1940  Today's Date: 01/13/2024 OT Individual Time: 8897-8852 OT Individual Time Calculation (min): 45 min  and Today's Date: 01/13/2024 OT Missed Time: 15 Minutes Missed Time Reason: Patient fatigue   Short Term Goals: Week 2:  OT Short Term Goal 1 (Week 2): Pt will follow single step command during ADL with MOD multimodal cues OT Short Term Goal 2 (Week 2): Pt will tolerate sitting up in wc between therapy session 3/7 days OT Short Term Goal 3 (Week 2): Pt will maintain attention to familiar ADL for 30 sec with SUP OT Short Term Goal 4 (Week 2): Pt will maintain sitting balance EOB in preparation for functional transfer with MIN A consistently  Skilled Therapeutic Interventions/Progress Updates:     Pt received sitting up in bed lightly sleeping, waking upon OT arrival with OT calling out [name] name. Pt verbally responding to OT good morning, how are you, however making no other verbal response during remainder of session. Pt receptive to skilled OT session reporting 0/10 pain- OT offering intermittent rest breaks, repositioning, and therapeutic support to optimize participation in therapy session. Pt maintaining eyes open this session, however demonstrating minimal visual attention to task with continued slowed task initiation and delayed processing. Pt transitioned to EOB with MAX +2 with HOB elevated- Pt able to scoot R LE x2 trials to bring it to EOB to participate in bed mobility. MAX required for sitting balance once EOB. Worked on following single step commands and participating in familiar ADLs. Pt able to bring R hand to chin to wash face following verbal/tactile cues and demonstration +increased time- increased challenges motor planning to bring wash cloth to eyes or forehead. MOD HOH A required to locate L UE and wash chest, stomach, and L UE. Worked on anterior functional  reaching for increased sitting balance challenge and to facilitate anterior weight shifting, minimal participation in this though. Pt attempt to reach towards target x2 trials requiring HOH to complete task and then did not participate any further. Engaged Pt in completing gentle AAROM of B UE bringing hands to chin to increase L side attention and functional movement patterns with MOD HOH A required to complete. Pt presenting to bed fatigued. Returned to bed MAX Ax2. Pt was left resting in bed with call bell in reach and all needs met.    Therapy Documentation Precautions:  Precautions Precautions: Fall Recall of Precautions/Restrictions: Impaired Precaution/Restrictions Comments: L hemipareisis, NPO, CorTrak Restrictions Weight Bearing Restrictions Per Provider Order: No   Therapy/Group: Individual Therapy  Katheryn SHAUNNA [place] 01/13/2024, 7:56 AM

## 2024-01-13 NOTE — Progress Notes (Signed)
 Speech Language Pathology Daily Session Note  Patient Details  Name: Dustin Hamilton MRN: [id number] Date of Birth: June 21, 1940  Today's Date: 01/13/2024 SLP Individual Time: 1445-1530 SLP Individual Time Calculation (min): 45 min  Short Term Goals: Week 2: SLP Short Term Goal 1 (Week 2): Patient will initiate pharyngeal swallow in 80% of trials given max multimodal A SLP Short Term Goal 2 (Week 2): Patient will follow single step commands in functional tasks given max multimodal A SLP Short Term Goal 3 (Week 2): Patient will verbalize single words in 50% of opportunities given max multimodal A SLP Short Term Goal 4 (Week 2): Patient will sustain attention during functional tasks for 20 seconds given max multimodal A  Skilled Therapeutic Interventions: Skilled therapy session focused on communicative and dysphagia goals. SLP facilitated session by targeting automatic speech tasks. Patient with significant improvements in verbalizations this date, counting 1-10, naming days of the week and months of the year given modA. Patient oriented to self and city, however not to situation nor specific location (hospital). SLP targeted receptive language through yes/no questions and single step commands. Patient with 50% accuracy given maxA. SLP targeted dysphagia goals through providing oral care and offering ice chips. Patient with delayed initiation of pharyngeal swallow requiring max verbal A. Patient with immediate coughing in 75% of trials likely indicative of bolus misdirection. Recommend continuation of NPO w/ alternative means of nutrition, hydration and medication administration. Patient left in bed with alarm set and call bell in reach. Continue POC.      Pain None reported   Therapy/Group: Individual Therapy  Addaline Peplinski M.A., CCC-SLP 01/13/2024, 7:41 AM

## 2024-01-14 ENCOUNTER — Telehealth: Payer: Self-pay | Admitting: *Deleted

## 2024-01-14 DIAGNOSIS — N179 Acute kidney failure, unspecified: Secondary | ICD-10-CM

## 2024-01-14 DIAGNOSIS — R1310 Dysphagia, unspecified: Secondary | ICD-10-CM

## 2024-01-14 LAB — BASIC METABOLIC PANEL WITH GFR
Anion gap: 6 (ref 5–15)
BUN: 30 mg/dL — ABNORMAL HIGH (ref 8–23)
CO2: 29 mmol/L (ref 22–32)
Calcium: 9.7 mg/dL (ref 8.9–10.3)
Chloride: 105 mmol/L (ref 98–111)
Creatinine, Ser: 0.79 mg/dL (ref 0.61–1.24)
GFR, Estimated: >60 mL/min (ref >60)
Glucose, Bld: 137 mg/dL — ABNORMAL HIGH (ref 70–99)
Potassium: 4.4 mmol/L (ref 3.5–5.1)
Sodium: 140 mmol/L (ref 135–145)

## 2024-01-14 LAB — GLUCOSE, CAPILLARY
Glucose-Capillary: 131 mg/dL — ABNORMAL HIGH (ref 70–99)
Glucose-Capillary: 140 mg/dL — ABNORMAL HIGH (ref 70–99)
Glucose-Capillary: 145 mg/dL — ABNORMAL HIGH (ref 70–99)
Glucose-Capillary: 146 mg/dL — ABNORMAL HIGH (ref 70–99)
Glucose-Capillary: 151 mg/dL — ABNORMAL HIGH (ref 70–99)
Glucose-Capillary: 160 mg/dL — ABNORMAL HIGH (ref 70–99)

## 2024-01-14 LAB — CBC
HCT: 47.6 % (ref 39.0–52.0)
Hemoglobin: 15.5 g/dL (ref 13.0–17.0)
MCH: 31.6 pg (ref 26.0–34.0)
MCHC: 32.6 g/dL (ref 30.0–36.0)
MCV: 96.9 fL (ref 80.0–100.0)
Platelets: 179 K/uL (ref 150–400)
RBC: 4.91 MIL/uL (ref 4.22–5.81)
RDW: 13.4 % (ref 11.5–15.5)
WBC: 7 K/uL (ref 4.0–10.5)
nRBC: 0 % (ref 0.0–0.2)

## 2024-01-14 NOTE — Clinical Note (Incomplete)
 IP Rehab Bowel Program Documentation   Bowel Program Start time ***  Dig Stim Indicated? {YES/NO:21197} Dig Stim Prior to Suppository or mini Enema X {Numbers; 1-5:17750}   Output from dig stim: {Desc; minimal/small/moderate/large/very large:110034}  Ordered intervention: Suppository {YES/NO:21197}, mini enema {YES/NO:21197},   Repeat dig stim after Suppository or Mini enema  X {Numbers; 1-5:17750},  Output? {Desc; minimal/small/moderate/large/very large:110034}   Bowel Program Complete? {YES/NO:21197}, handoff given ***  Patient Tolerated? {YES/NO:21197}

## 2024-01-14 NOTE — Progress Notes (Signed)
 Speech Language Pathology Daily Session Note  Patient Details  Name: Dustin Hamilton MRN: [id number] Date of Birth: March 21, 1941  Today's Date: 01/14/2024 SLP Individual Time: 8764-8664 8543-8475 SLP Individual Time Calculation (min): 60 min 28 minutes   Short Term Goals: Week 2: SLP Short Term Goal 1 (Week 2): Patient will initiate pharyngeal swallow in 80% of trials given max multimodal A SLP Short Term Goal 2 (Week 2): Patient will follow single step commands in functional tasks given max multimodal A SLP Short Term Goal 3 (Week 2): Patient will verbalize single words in 50% of opportunities given max multimodal A SLP Short Term Goal 4 (Week 2): Patient will sustain attention during functional tasks for 20 seconds given max multimodal A  Skilled Therapeutic Interventions: Session 1: Skilled therapy session focused on family education, dysphagia and communicative goals. SLP facilitated communication through orientation and automatic speech tasks. Patient oriented to self, however required total A to recall situation and location. Patient participated in automatic speech tasks targeting name, numbers, days of the week and months of the year. Patient required maxA to increase vocal quality to increase speech intelligibility. SLP targeted dysphagia goals through providing oral care through suction toothbrush and offering ice chips. Patient with swallow initiation in ~50% of trials, requiring suction for remainder. Patient with immediate coughing in all trials likely indicative of bolus misdirection. Lastly, SLP targeted family education through review of patients current communicative and swallowing status. SLP provided hands on demonstration for completion of suction oral care. Patients family verbalized understanding. Patient left in bed with alarm set and call bell in reach. Continue POC Session 2: Direct handoff from PT. Skilled therapy session focused on dysphagia and communication goals.  Patient with increased alertness this afternoon, verbalizing name and wife's name with minA. SLP targeted communication goals through respiratory support exercises. Patient with significantly reduced respiratory strength observed through reduced vocal intensity and thus suspect ineffective cough. Patient completed x10 repetitions of expiratory blows with visual feedback. SLP targeted dysphagia goals through offering ice chips. Patient with pharyngeal swallow initiation in all PO trials, however delayed. Explosive coughing episode in last trial before POs were d/c. Recommend continuing NPO w/ PO trials during ST sessions. Patient left in chair with alarm set and call bell in reach. Continue POC.   Pain Session 1: None visualized  Session 2: None visualized   Therapy/Group: Individual Therapy  Stepahnie Campo M.A., CCC-SLP 01/14/2024, 6:42 AM

## 2024-01-14 NOTE — Plan of Care (Signed)
  Problem: RH BOWEL ELIMINATION Goal: RH STG MANAGE BOWEL WITH ASSISTANCE Description: STG Manage Bowel with toileting min Assistance. Outcome: Progressing   Problem: RH BLADDER ELIMINATION Goal: RH STG MANAGE BLADDER WITH ASSISTANCE Description: STG Manage Bladder With min Assistance Outcome: Progressing   Problem: RH SAFETY Goal: RH STG ADHERE TO SAFETY PRECAUTIONS W/ASSISTANCE/DEVICE Description: STG Adhere to Safety Precautions With cues Assistance/Device. Outcome: Progressing   Problem: RH PAIN MANAGEMENT Goal: RH STG PAIN MANAGED AT OR BELOW [name] PAIN GOAL Description: Pain < 4 with prns Outcome: Progressing

## 2024-01-14 NOTE — Telephone Encounter (Signed)
 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  EGM c/w intermittent RV LOC  RV pacing percentage not shown on current transmission, but on previous in-clinic transmission from 12/31/23 showed >80% pacing requirements   RV threshold trends reviewed on multiple previous transmissions, and have been stable between 1.0 - 1.4 mV @ 0.4 mS since implant on 05/28/20  EGM, lead trends, and CXR's (implant/current) reviewed with implanting MD (GT)  Provider requested to have patient brought in to clinic for manual threshold testing of RV lead in both bipolar and unipolar settings  Patient is currently in the rehabilitation center at [facility] and is unable to come in to the clinic  Called and spoke with patient's spouse Harriet) to notify of potential issue and request permission to send a [employer] technician over to the hospital to perform the interrogation on the patient's device  [name] spouse was agreeable to this and grateful for the call from this RN  Called and notified [employer] rep [place] of situation and he stated he will notify the rep team of situation and either he, or someone else will get the patient's device checked today and then follow up with device team regarding results

## 2024-01-14 NOTE — Progress Notes (Signed)
 PROGRESS NOTE   Subjective/Complaints: Patient drowsy this morning, no new concerns or complaints elicited.    ROS- limited by cognition   Objective:   CT ABDOMEN WO CONTRAST Result Date: 01/14/2024 CLINICAL DATA:  Cerebral infarction and evaluation for possible percutaneous gastrostomy tube placement for nutritional needs. EXAM: CT ABDOMEN WITHOUT CONTRAST TECHNIQUE: Multidetector CT imaging of the abdomen was performed following the standard protocol without IV contrast. RADIATION DOSE REDUCTION: This exam was performed according to the departmental dose-optimization program which includes automated exposure control, adjustment of the mA and/or kV according to patient size and/or use of iterative reconstruction technique. COMPARISON:  Prior CT of the abdomen and pelvis on 04/24/2023 FINDINGS: Lower chest: Right basilar atelectasis. Hepatobiliary: No focal liver abnormality is seen. No gallstones, gallbladder wall thickening, or biliary dilatation. Pancreas: Unremarkable. No pancreatic ductal dilatation or surrounding inflammatory changes. Spleen: No splenic injury or perisplenic hematoma. Adrenals/Urinary Tract: Adrenal glands are unremarkable. No renal calculi or hydronephrosis. Stable 1.7 cm right hyperdense renal cyst consistent with a hemorrhagic cyst. Stable Bosniak 1 cysts of the left kidney and small hyperdense 7 mm hemorrhagic cyst of the left kidney. Stomach/Bowel: No hiatal hernia. A feeding tube enters the stomach and terminates at the level of the duodenal bulb. The stomach is normally positioned. No anatomic contraindication to attempted percutaneous gastrostomy. No bowel obstruction or significant ileus identified. No evidence of free intraperitoneal air. Visualized bowel demonstrates no visible lesion or inflammatory process. Vascular/Lymphatic: Minimal atherosclerosis of the abdominal aorta. No aneurysm identified. No  lymphadenopathy. Other: No abdominal wall hernia identified. No ascites or abnormal fluid collections. Musculoskeletal: No bony abnormalities identified. Degenerative disc disease of the visualized spine. No bony lesions. No body wall anasarca. IMPRESSION: 1. No anatomic contraindication to attempted percutaneous gastrostomy. 2. Stable bilateral renal cysts, including bilateral hemorrhagic cysts. 3. Right basilar atelectasis. 4. Aortic atherosclerosis. Electronically Signed   By: Marcey Moan M.D.   On: 01/14/2024 12:51   Recent Labs    01/14/24 0503  WBC 7.0  HGB 15.5  HCT 47.6  PLT 179    Recent Labs    01/14/24 0503  NA 140  K 4.4  CL 105  CO2 29  GLUCOSE 137*  BUN 30*  CREATININE 0.79  CALCIUM  9.7     Intake/Output Summary (Last 24 hours) at 01/14/2024 1322 Last data filed at 01/14/2024 1212 Gross per 24 hour  Intake 800 ml  Output 1350 ml  Net -550 ml        Physical Exam: Vital Signs Blood pressure 124/89, pulse 78, temperature 98 F (36.7 C), temperature source Oral, resp. rate 16, height 5' 11 (1.803 m), weight 63 kg, SpO2 98%. General: No acute distress ENT- cortrak in nares  Masked facies  General: No acute distress, resting in bed. Mood and affect flat, doesn't want to open eyes much today Heart: Regular rate and rhythm no rubs murmurs or extra sounds Lungs: Clear to auscultation bilaterally, nonlabored breathing Abdomen: Positive bowel sounds, soft nontender to palpation, nondistended Extremities: No clubbing, cyanosis, or edema Skin: No evidence of breakdown, no evidence of rash over exposed surfaces Neuro: Very drowsy appearing, follows very simple command (open eyes) but otherwise  doesn't engage much Neurologic: masked facies, eyes closed , no spontaneous movement . motor strength is 4/5 in right deltoid, bicep, tricep, grip, 2-/5 LUE and LLE- MMT varies based on LOA - lost focus during LE MMT with limited attempt despite cuing  Sensory exam limited  by attention and concentration  Cerebellar exam could not cooperate Musculoskeletal: no pain with hip or knee ROM BLE   Assessment/Plan: 1. Functional deficits which require 3+ hours per day of interdisciplinary therapy in a comprehensive inpatient rehab setting. Physiatrist is providing close team supervision and 24 hour management of active medical problems listed below. Physiatrist and rehab team continue to assess barriers to discharge/monitor patient progress toward functional and medical goals  Care Tool:  Bathing    Body parts bathed by patient: Face   Body parts bathed by helper: Left arm, Abdomen, Chest, Right arm, Right upper leg, Left upper leg     Bathing assist Assist Level: 2 Helpers     Upper Body Dressing/Undressing Upper body dressing   What is the patient wearing?: Pull over shirt    Upper body assist Assist Level: 2 Helpers    Lower Body Dressing/Undressing Lower body dressing      What is the patient wearing?: Underwear/pull up, Pants     Lower body assist Assist for lower body dressing: 2 Helpers     Toileting Toileting    Toileting assist Assist for toileting: 2 Helpers     Transfers Chair/bed transfer  Transfers assist  Chair/bed transfer activity did not occur: Safety/medical concerns  Chair/bed transfer assist level: Dependent - Patient 0%     Locomotion Ambulation   Ambulation assist   Ambulation activity did not occur: Safety/medical concerns  Assist level: 2 helpers Assistive device: Other (comment) (3 musketeer support) Max distance: 6 ft   Walk 10 feet activity   Assist  Walk 10 feet activity did not occur: Safety/medical concerns        Walk 50 feet activity   Assist Walk 50 feet with 2 turns activity did not occur: Safety/medical concerns         Walk 150 feet activity   Assist Walk 150 feet activity did not occur: Safety/medical concerns         Walk 10 feet on uneven surface   activity   Assist Walk 10 feet on uneven surfaces activity did not occur: Safety/medical concerns         Wheelchair     Assist Is the patient using a wheelchair?: Yes Type of Wheelchair: Manual Wheelchair activity did not occur: Safety/medical concerns  Wheelchair assist level: Dependent - Patient 0%      Wheelchair 50 feet with 2 turns activity    Assist    Wheelchair 50 feet with 2 turns activity did not occur: Safety/medical concerns   Assist Level: Dependent - Patient 0%   Wheelchair 150 feet activity     Assist  Wheelchair 150 feet activity did not occur: Safety/medical concerns   Assist Level: Dependent - Patient 0%   Blood pressure 124/89, pulse 78, temperature 98 F (36.7 C), temperature source Oral, resp. rate 16, height 5' 11 (1.803 m), weight 63 kg, SpO2 98%.  Medical Problem List and Plan: 1. 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  Antithrombotics: -DVT/anticoagulation:  Pharmaceutical: Lovenox  40mg  daily             -antiplatelet therapy: ASA/Plavix  X 3 weeks followed by Plavix  alone  Plavix  and aspirin  held for PEG 3. Pain Management: Tylenol  prn.  4. Mood/Behavior/Sleep: LCSW to follow for evaluation and support.             --hx of sundowning-->continue melatonin 3 mg/Monitor sleep hygeine             -antipsychotic agents: N/A -Reduced level of alertness trial amantadine - no significant changes will trial methylphenidate  10mg  BID starting noon 8/11 5. Neuropsych/cognition: This patient is not capable of making decisions  6. Skin/Wound Care:  Routine pressure relief measures.  7. Fluids/Electrolytes/Nutrition: Continue  NPO due to holding of orals.              --on tube feeds.  8. HTN: Monitor BP TID--goal 150 to avoid symptoms (multiple ED visits this year)             --hx of chronic dizziness. Needs to stay hydrated.  -8/14 BP controlled continue to monitor Vitals:   01/10/24 1522 01/10/24 1944 01/11/24 0501 01/11/24 1241  BP: 112/78 129/86 104/73 122/83   01/11/24 1937 01/12/24 0406 01/12/24 1300 01/12/24 2030  BP: 129/89 128/78 112/82 133/82   01/13/24 0529 01/13/24 1229 01/13/24 1947 01/14/24 0603  BP: 129/82 (!) 138/90 138/87 124/89    9. H/o SVT/CHF s/p PPM/syncope: Monitor for orthostatic symptoms   10. BPH/urinary retention: Urecholine  and Proscar  cannot be crushed per pharmacy so will hold (cannot use Avodart  capsule due to TF).  -01/09/24 PVR documented once, 66; monitor; of note, getting urocholine 10mg  TID... -01/10/24 low PVRs but not many documented  11. 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 Glaucoma: Resume home gtts Alphagan , Trusopt , Xalatan . 13. Dysphagia: NPO with tube feeds. May need PEG for nutritional support. BUN up will increase free H20  - Patient has IR consult for PEG 14. Acute renal failure: Improved from 24/1.36 to 27/0.99>> 30/0.89  - 8/14 improved at creatinine 0.79/BUN 30 15. Chronic Thrombocytopenia: stable      Latest Ref Rng & Units 01/14/2024    5:03 AM 01/11/2024    4:57 AM 01/07/2024    4:29 AM  CBC  WBC 4.0 - 10.5 K/uL 7.0  5.6  8.3   Hemoglobin 13.0 - 17.0 g/dL 84.4  84.7  84.5   Hematocrit 39.0 - 52.0 % 47.6  45.2  46.4   Platelets 150 - 400 K/uL 179  138  143      16.  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  UA + WBC but only rare bact, treat empirically keflex  500mg  BID x 3 d to see if this improves MS -No change in cognition noted   Urine blood tinged yesterday , monitor Hgb ok  Hgb is normal ,  mildly low plt also on asa , plavix  and enoxaparin , will monitor  May need to hold enoxaparin  18. Hx of gout: allopurinol  100mg  daily   LOS: 10 days A FACE TO FACE EVALUATION WAS PERFORMED  Murray Collier 01/14/2024, 1:22 PM

## 2024-01-14 NOTE — Progress Notes (Addendum)
 Patient ID: [name], male   DOB: 01-18-1941, 83 y.o.   MRN: [id number]  Have sent order for hospital bed, hoyer lift, suction machine and TIS wheelchair for pt from the [facility]. Will sent tube feedings orders once have them after his PEG placement. Wife will begin looking into hired assist and would like for more aide hours per [facility].   2;10 PM Left message for Shcaleah Kelton-[facility] to ask aboout increased coverage for aide services he already was  receiving and he has VA coverage for SNF. Await return call  2;28 PM Spoke with Shcaleah [facility] who reports he is not service connected and will needs to use his [facility] medicare for placement if decided upon. Asked about getting more aide service at the most he could get 4 more hours so 16 hours total per week.

## 2024-01-14 NOTE — Progress Notes (Signed)
 Occupational Therapy Session Note  Patient Details  Name: Dustin Hamilton MRN: [id number] Date of Birth: 1941-05-14  Today's Date: 01/14/2024 OT Individual Time: 1020-1047 OT Individual Time Calculation (min): 27 min    Short Term Goals: Week 2:  OT Short Term Goal 1 (Week 2): Pt will follow single step command during ADL with MOD multimodal cues OT Short Term Goal 2 (Week 2): Pt will tolerate sitting up in wc between therapy session 3/7 days OT Short Term Goal 3 (Week 2): Pt will maintain attention to familiar ADL for 30 sec with SUP OT Short Term Goal 4 (Week 2): Pt will maintain sitting balance EOB in preparation for functional transfer with MIN A consistently  Skilled Therapeutic Interventions/Progress Updates:     Pt received sitting up in bed with sister and niece present in room upon OT arrival. [name] family inquiring about [name] progress and participation in therapy. Provided education that Pt has been very lethargic during his IPR stay, requiring increased time and maximal cues to participate in therapy session. Overall, Pt has not made many functional gains in therapy requiring 2+ assist to complete bed mobility, transfers, and all ADLs. He has demonstrated some improvement in alertness levels in the PM, however functionally his progress has been very slow. Educated family on importance of caregivers and family being present and participatory in therapy session to gain better insight of how to be care for Pt at d/c and determine if they will be able to provide the necessary level of assistance. [name] family demonstrating decreased insight into his current deficits and would benefit from additional education.  [name] condom cath noted to have come off and brief was soiled of urine. Engaged Pt in completing rolling in bed R<>L to complete peri-care and doff/donn brief with MAX A +2 required to complete bed mobility. Very minimal task initiation during bed mobility. Donned OH shirt in long  sitting position MAX A +2 with Pt able to lift R UE to weave it into sleeve with MOD A +increased time. Pt presenting to be in 0/10 pain during session. Pt was left resting in bed with call bell in reach, bed alarm on, and all needs met.    Therapy Documentation Precautions:  Precautions Precautions: Fall Recall of Precautions/Restrictions: Impaired Precaution/Restrictions Comments: L hemipareisis, NPO, CorTrak Restrictions Weight Bearing Restrictions Per Provider Order: No   Therapy/Group: Individual Therapy  Katheryn SHAUNNA [place] 01/14/2024, 7:56 AM

## 2024-01-14 NOTE — Progress Notes (Signed)
 Physical Therapy Session Note  Patient Details  Name: Dustin Hamilton MRN: [id number] Date of Birth: Mar 23, 1941  Today's Date: 01/14/2024 PT Individual Time: 1400-1456 PT Individual Time Calculation (min): 56 min   Short Term Goals: Week 1:  PT Short Term Goal 1 (Week 1): Pt will perform bed rolling with MaxA +1. PT Short Term Goal 2 (Week 1): Pt will perform supine<>sit with MaxA +1. PT Short Term Goal 3 (Week 1): Pt will perform sit<>stand with MaxA +2. PT Short Term Goal 4 (Week 1): Pt will maintain unsupported seated balance with MinA for more than 3 min. PT Short Term Goal 5 (Week 1): Pt will demonstrate improved initiation by following more than 50% of verbal instructions for mobility.  Skilled Therapeutic Interventions/Progress Updates:      Pt in bed to start. His sister and niece present at the bedside. Pt does not appear to be in any pain during treatment. Patient noted to be incontinent of urine. Completed totalA +2 for bed mobility and for brief change. Assisted with pericare as well. Family behind curtain during brief change and pericare. Patient assisted with donning pants at bed level with +2 totalA via rolling.   Completed supine<>Sitting EOB with +2 totalA with use of hospital bed features. Completed squat pivot transfer towards his L side from EOB to w/c with +2 totalA using over the back method to promote forward weight shifting during transfer.  Patient taken to day room gym and setup in standing frame. Patient completed standing via lift in frame with +2 assist for safety to manage his trunk and BUE's. Patient positioned into full upright standing and tolerated well. Slightly flexed at the trunk and supports himself on his elbows. Worked on sustained static standing for ~2 minute intervals. Upgraded task to pull Squigz off window and the table-top - patient requiring hand-over-hand assist for initiation and task. Frequent secretions from mouth during treatment while  standing, needing assist for managing and cleaning.   Finished session in Dance Group for individualized treatment, while completed PROM for rhythmic movements of BUE's to coordinate with the music Rexie). Patient integrating some self movements such as tapping his lap, tapping his heels, but no large movements.   Direct handoff of care to SLP at end of treatment with patient positioned in TIS w/c.    Therapy Documentation Precautions:  Precautions Precautions: Fall Recall of Precautions/Restrictions: Impaired Precaution/Restrictions Comments: L hemipareisis, NPO, CorTrak Restrictions Weight Bearing Restrictions Per Provider Order: No General:       Therapy/Group: Individual Therapy  Sherlean SHAUNNA Perks 01/14/2024, 7:47 AM

## 2024-01-15 ENCOUNTER — Encounter

## 2024-01-15 DIAGNOSIS — R319 Hematuria, unspecified: Secondary | ICD-10-CM

## 2024-01-15 LAB — GLUCOSE, CAPILLARY
Glucose-Capillary: 157 mg/dL — ABNORMAL HIGH (ref 70–99)
Glucose-Capillary: 134 mg/dL — ABNORMAL HIGH (ref 70–99)

## 2024-01-15 MED ORDER — JEVITY 1.5 CAL/FIBER PO LIQD
1000.0000 mL | ORAL | Status: DC
  Administered 2024-01-15 – 2024-01-17 (×3): 1000 mL
  Filled 2024-01-15 (×7): qty 1000

## 2024-01-15 NOTE — Progress Notes (Signed)
 Physical Therapy Session Note  Patient Details  Name: Dustin Hamilton MRN: [id number] Date of Birth: 16-Jan-1941  Today's Date: 01/15/2024 PT Individual Time: 8547-8466 PT Individual Time Calculation (min): 41 min   Short Term Goals: Week 1:  PT Short Term Goal 1 (Week 1): Pt will perform bed rolling with MaxA +1. PT Short Term Goal 1 - Progress (Week 1): Not met PT Short Term Goal 2 (Week 1): Pt will perform supine<>sit with MaxA +1. PT Short Term Goal 2 - Progress (Week 1): Not met PT Short Term Goal 3 (Week 1): Pt will perform sit<>stand with MaxA +2. PT Short Term Goal 3 - Progress (Week 1): Met PT Short Term Goal 4 (Week 1): Pt will maintain unsupported seated balance with MinA for more than 3 min. PT Short Term Goal 4 - Progress (Week 1): Progressing toward goal PT Short Term Goal 5 (Week 1): Pt will demonstrate improved initiation by following more than 50% of verbal instructions for mobility. PT Short Term Goal 5 - Progress (Week 1): Progressing toward goal Week 2:  PT Short Term Goal 1 (Week 2): Pt will complete bed mobility with MaxA +1. PT Short Term Goal 2 (Week 2): Pt will perform sit<>stand with MaxA +1. PT Short Term Goal 3 (Week 2): Pt will perform seat to seat pivot transfers with MaxA +2. PT Short Term Goal 4 (Week 2): Pt will maintain sitting balance with vc and self corrections and overall MinA for more than .  Skilled Therapeutic Interventions/Progress Updates:  Patient supine in bed on entrance to room. Wife present. Patient alert and agreeable to PT session. Wife requesting PT to ask for extension of stay d/t pt requiring more time to progress. Discussion with wife following performance of transfer from bed>w/c where pt requires TotA +2. Related his lack of ability to initiate mobility voluntarily, hold balance, motor process and plan. He is not expected to progress to next LOA within a reasonable amount of time and not expected to see that progress within time  in IPR. Will take focused skilled therapy long term in order to see minimal progress.   Patient with no pain complaint at start of session.  Therapeutic Activity: Pt continues to require TotA for initiation of LB or UB movement. Tone into trunk extension preventing ease of movement but pt also demos decreased ability to motor plan/ execute voluntarily. Supine >sit requires TotA with MinA from +2. Transferred to w/c to pt's L side with DEP/ totA and +2 available for safety and MinA.   Attempted sit<>stand in // bars with totA +2 but requires hand over hand assist to position UE on bar and unable to maintain balance. Significant tone noted in LLE extension to assist with stance, however pt also with significant forward trunk positioning and inability to position to neutral posture. Pt also does not raise head with vc/ tc.   Attempted ambulation in EVA walker, however, pt requires several attempts to rise to stand and maintain arm position on forearm pads. Unable to maintain balanced stance despite forward positioning of walker. Several attempts but does not move feet.   Patient returned to supine in bed at end of session requiring TotA +1 with MinA from +2 for safety/ positioning. Bed brakes locked, no alarm set as wife in room and pt with no impulsive movements, and all needs within reach.   Therapy Documentation Precautions:  Precautions Precautions: Fall Recall of Precautions/Restrictions: Impaired Precaution/Restrictions Comments: L hemipareisis, NPO, CorTrak Restrictions Weight Bearing Restrictions  Per Provider Order: No  Pain:  No pain indicated this session.   Therapy/Group: Individual Therapy  Mliss DELENA Milliner PT, DPT, CSRS 01/15/2024, 7:11 PM

## 2024-01-15 NOTE — Progress Notes (Signed)
 Physical Therapy Session Note  Patient Details  Name: Dustin Hamilton MRN: [id number] Date of Birth: 13-Dec-1940  Today's Date: 01/15/2024 PT Individual Time: 0935-1005 PT Individual Time Calculation (min): 30 min   Short Term Goals: Week 1:  PT Short Term Goal 1 (Week 1): Pt will perform bed rolling with MaxA +1. PT Short Term Goal 1 - Progress (Week 1): Not met PT Short Term Goal 2 (Week 1): Pt will perform supine<>sit with MaxA +1. PT Short Term Goal 2 - Progress (Week 1): Not met PT Short Term Goal 3 (Week 1): Pt will perform sit<>stand with MaxA +2. PT Short Term Goal 3 - Progress (Week 1): Met PT Short Term Goal 4 (Week 1): Pt will maintain unsupported seated balance with MinA for more than 3 min. PT Short Term Goal 4 - Progress (Week 1): Progressing toward goal PT Short Term Goal 5 (Week 1): Pt will demonstrate improved initiation by following more than 50% of verbal instructions for mobility. PT Short Term Goal 5 - Progress (Week 1): Progressing toward goal Week 2:  PT Short Term Goal 1 (Week 2): Pt will complete bed mobility with MaxA +1. PT Short Term Goal 2 (Week 2): Pt will perform sit<>stand with MaxA +1. PT Short Term Goal 3 (Week 2): Pt will perform seat to seat pivot transfers with MaxA +2. PT Short Term Goal 4 (Week 2): Pt will maintain sitting balance with vc and self corrections and overall MinA for more than .  Skilled Therapeutic Interventions/Progress Updates:  Patient supine in bed on entrance to room. Patient alert with eyes open and responding to greeting with good morning. Agreeable to PT session. SIL and niece in room.   Patient with no pain complaint at start of session.  Attempted to engage pt with performing high five with R hand but pt unable to lift entire arm from bed - only lifts R hand. Then attempted to assist LUE with pt minimally lifting hand with wrist extension while on pillow but unable to lift hand despite max multimodal  cues.  Therapeutic Activity: Bed Mobility: Pt demonstrating no initiation of reaching upright seated position despite max multimodal cues and attempts with facilitation. Pt required Tot/ DEP assist to reach seated position on EOB. Required same assist for return to supine at end of session.   Neuromuscular Re-ed: NMR facilitated during session with focus on seated balance, initiation, motor control, attention to task/ awareness. Pt guided in maintenance in seated balance, joint approximation to LUE with positioning into shoulder ER, elbow ext and palm flat on bed surface. Passively assisted into increased lean to L for increased approximation for attempt to increase sensation/ proprioception. Also guided in attempts to reach [name] hand as visual target to increase awareness/ attn to task, and motor control. Raises RUE but unable to fully reach throughout all reaching tasks, however does initiate. Also noted tapping of R index finger to pt's favorite album.   Bed elevated throughout for attempt in approximation and increased motor control of L>RLE. Guard/ block to L knee required. Extensor tone felt throughout.   Patient supine in bed at end of session with brakes locked, bed alarm set, and all needs within reach. OT taking over care.    Therapy Documentation Precautions:  Precautions Precautions: Fall Recall of Precautions/Restrictions: Impaired Precaution/Restrictions Comments: L hemipareisis, NPO, CorTrak Restrictions Weight Bearing Restrictions Per Provider Order: No  Pain:  No pain indicated this session.   Therapy/Group: Individual Therapy  Mliss DELENA Milliner PT, DPT,  CSRS 01/15/2024, 12:57 PM

## 2024-01-15 NOTE — Progress Notes (Signed)
 Occupational Therapy Session Note  Patient Details  Name: Dustin Hamilton MRN: [id number] Date of Birth: May 27, 1941  Today's Date: 01/15/2024 OT Individual Time: 1010-1107 OT Individual Time Calculation (min): 57 min  OT Individual Time: 8693-8668 OT Individual Time Calculation (min): 25 min   Short Term Goals: Week 2:  OT Short Term Goal 1 (Week 2): Pt will follow single step command during ADL with MOD multimodal cues OT Short Term Goal 2 (Week 2): Pt will tolerate sitting up in wc between therapy session 3/7 days OT Short Term Goal 3 (Week 2): Pt will maintain attention to familiar ADL for 30 sec with SUP OT Short Term Goal 4 (Week 2): Pt will maintain sitting balance EOB in preparation for functional transfer with MIN A consistently  Skilled Therapeutic Interventions/Progress Updates:     AM Session:  Pt received returning to bed with PT present in room upon OT arrival finishing up session. Pt presenting to be more alert this AM, receptive to skilled OT session reporting 0/10 pain- OT offering intermittent rest breaks, repositioning, and therapeutic support to optimize participation in therapy session. Family present in room upon OT arrival.   Completed supine > EOB with +2 total A with use of hospital bed features. Squat pivot transfer towards his L side with +2 total A- Pt able to participate with partially lifting trunk following max verbal cues. Transported Pt total A to therapy gym in [facility] for time management.   Pt participated in standing via standing frame with +2 assist provided for management trunk and B UEs. Focused activity on trunk control, task initiation, attention, and facilitating WB'ing through B U/LEs. Pt able to tolerate upright standing position well completing total of 3 stands for 3-4 minutes each. Played Pt desired music during standing with Pt able to tap along to music using B UE with MAX tactile cues provided to incorporate L UE into activities. Pt fatigued at  end of session nodding yes requesting to return to bed.   Transported Pt back to room total A in TISWC. Squat pivot TISWC > EOB > supine total A +2. Briefly updated [name] family on [name] progress at end of session and encouraged participation in therapy sessions in preparation for d/c. Pt was left resting in bed with call bell in reach, bed alarm on, and all needs met.    PM Session:  Pt received semi-reclined in bed with sister and nice present in room upon OT arrival. Pt sleeping upon OT arrival, however waking with increased time and maintaining eyes open during session. Pt receptive to skilled OT session reporting 0/10 pain- OT offering intermittent rest breaks, repositioning, and therapeutic support to optimize participation in therapy session. Pt more responsive to OT this session and demonstrating improvement in following single step commands and task initiation. Transitioned Pt to EOB with total A +2- Pt did follow verbal cues to begin to lift R LE to bring it to EOB, however continued to require assistance to completely lift R LE. Sitting EOB, Pt required MAX A to maintain sitting balance- able to minimally lift head and trunk following verbal and tactile cues for improved alignment. Rigid trunk and neck noted. Pt able to locate L UE with MIN HOH A this session. Engaged Pt in completing functional movement patterns with hands clasped moving through AAROM of bicep flexion/extension, shoulder flex/extension to 90*, wrist flex/extension, and digit flexion/extension. MIN-MOD HOH A required for L UE positioning and alignment +consistent verbal and tactile cues during AAROM. Pt then  worked on targeted reach and anterior weight shifting as a Print production planner for sit<>stands- Pt able to reach for target x3 trials using R UE, requiring MAX A for anterior weight shifting and to return trunk to midline. Pt fatigued at end of session. Returned to bed with total +2 A. Pt was left resting in bed with call bell in  reach, bed alarm on, and all needs met.    Therapy Documentation Precautions:  Precautions Precautions: Fall Recall of Precautions/Restrictions: Impaired Precaution/Restrictions Comments: L hemipareisis, NPO, CorTrak Restrictions Weight Bearing Restrictions Per Provider Order: No   Therapy/Group: Individual Therapy  Katheryn SHAUNNA [place]ines 01/15/2024, 7:48 AM

## 2024-01-15 NOTE — Progress Notes (Addendum)
 PROGRESS NOTE   Subjective/Complaints: Patient remains drowsy.  No new complaints or concerns.  Chart review indicates he participated with OT today.   ROS- limited by cognition   Objective:   CT ABDOMEN WO CONTRAST Result Date: 01/14/2024 CLINICAL DATA:  Cerebral infarction and evaluation for possible percutaneous gastrostomy tube placement for nutritional needs. EXAM: CT ABDOMEN WITHOUT CONTRAST TECHNIQUE: Multidetector CT imaging of the abdomen was performed following the standard protocol without IV contrast. RADIATION DOSE REDUCTION: This exam was performed according to the departmental dose-optimization program which includes automated exposure control, adjustment of the mA and/or kV according to patient size and/or use of iterative reconstruction technique. COMPARISON:  Prior CT of the abdomen and pelvis on 04/24/2023 FINDINGS: Lower chest: Right basilar atelectasis. Hepatobiliary: No focal liver abnormality is seen. No gallstones, gallbladder wall thickening, or biliary dilatation. Pancreas: Unremarkable. No pancreatic ductal dilatation or surrounding inflammatory changes. Spleen: No splenic injury or perisplenic hematoma. Adrenals/Urinary Tract: Adrenal glands are unremarkable. No renal calculi or hydronephrosis. Stable 1.7 cm right hyperdense renal cyst consistent with a hemorrhagic cyst. Stable Bosniak 1 cysts of the left kidney and small hyperdense 7 mm hemorrhagic cyst of the left kidney. Stomach/Bowel: No hiatal hernia. A feeding tube enters the stomach and terminates at the level of the duodenal bulb. The stomach is normally positioned. No anatomic contraindication to attempted percutaneous gastrostomy. No bowel obstruction or significant ileus identified. No evidence of free intraperitoneal air. Visualized bowel demonstrates no visible lesion or inflammatory process. Vascular/Lymphatic: Minimal atherosclerosis of the abdominal  aorta. No aneurysm identified. No lymphadenopathy. Other: No abdominal wall hernia identified. No ascites or abnormal fluid collections. Musculoskeletal: No bony abnormalities identified. Degenerative disc disease of the visualized spine. No bony lesions. No body wall anasarca. IMPRESSION: 1. No anatomic contraindication to attempted percutaneous gastrostomy. 2. Stable bilateral renal cysts, including bilateral hemorrhagic cysts. 3. Right basilar atelectasis. 4. Aortic atherosclerosis. Electronically Signed   By: Marcey Moan M.D.   On: 01/14/2024 12:51   Recent Labs    01/14/24 0503  WBC 7.0  HGB 15.5  HCT 47.6  PLT 179    Recent Labs    01/14/24 0503  NA 140  K 4.4  CL 105  CO2 29  GLUCOSE 137*  BUN 30*  CREATININE 0.79  CALCIUM  9.7     Intake/Output Summary (Last 24 hours) at 01/15/2024 1657 Last data filed at 01/15/2024 1616 Gross per 24 hour  Intake 800 ml  Output 1000 ml  Net -200 ml        Physical Exam: Vital Signs Blood pressure 129/88, pulse 78, temperature (!) 97.4 F (36.3 C), temperature source Oral, resp. rate 17, height 5' 11 (1.803 m), weight 63 kg, SpO2 98%. General: No acute distress ENT- cortrak in nares  Masked facies  General: No acute distress, resting in bed. Mood and affect flat, doesn't want to open eyes much today Heart: Regular rate and rhythm no rubs murmurs or extra sounds Lungs: Clear to auscultation bilaterally, nonlabored breathing Abdomen: Positive bowel sounds, soft nontender to palpation, nondistended Extremities: No clubbing, cyanosis, or edema Skin: No evidence of breakdown, no evidence of rash over exposed surfaces Neuro: Very drowsy appearing,  follows very simple command (open eyes) but otherwise doesn't engage much Neurologic: masked facies, eyes closed , no spontaneous movement . motor strength is 4/5 in right deltoid, bicep, tricep, grip, 2-/5 LUE and LLE- MMT varies based on LOA - lost focus during LE MMT with limited  attempt despite cuing  Sensory exam limited by attention and concentration  Cerebellar exam could not cooperate  Prior neuro assessment is c/w today's exam 01/15/2024.  Musculoskeletal: no pain with hip or knee ROM BLE   Assessment/Plan: 1. Functional deficits which require 3+ hours per day of interdisciplinary therapy in a comprehensive inpatient rehab setting. Physiatrist is providing close team supervision and 24 hour management of active medical problems listed below. Physiatrist and rehab team continue to assess barriers to discharge/monitor patient progress toward functional and medical goals  Care Tool:  Bathing    Body parts bathed by patient: Face   Body parts bathed by helper: Left arm, Abdomen, Chest, Right arm, Right upper leg, Left upper leg     Bathing assist Assist Level: 2 Helpers     Upper Body Dressing/Undressing Upper body dressing   What is the patient wearing?: Pull over shirt    Upper body assist Assist Level: 2 Helpers    Lower Body Dressing/Undressing Lower body dressing      What is the patient wearing?: Underwear/pull up, Pants     Lower body assist Assist for lower body dressing: 2 Helpers     Toileting Toileting    Toileting assist Assist for toileting: Dependent - Patient 0%     Transfers Chair/bed transfer  Transfers assist  Chair/bed transfer activity did not occur: Safety/medical concerns  Chair/bed transfer assist level: Dependent - Patient 0%     Locomotion Ambulation   Ambulation assist   Ambulation activity did not occur: Safety/medical concerns  Assist level: 2 helpers Assistive device: Other (comment) (3 musketeer support) Max distance: 6 ft   Walk 10 feet activity   Assist  Walk 10 feet activity did not occur: Safety/medical concerns        Walk 50 feet activity   Assist Walk 50 feet with 2 turns activity did not occur: Safety/medical concerns         Walk 150 feet activity   Assist Walk  150 feet activity did not occur: Safety/medical concerns         Walk 10 feet on uneven surface  activity   Assist Walk 10 feet on uneven surfaces activity did not occur: Safety/medical concerns         Wheelchair     Assist Is the patient using a wheelchair?: Yes Type of Wheelchair: Manual Wheelchair activity did not occur: Safety/medical concerns  Wheelchair assist level: Dependent - Patient 0%      Wheelchair 50 feet with 2 turns activity    Assist    Wheelchair 50 feet with 2 turns activity did not occur: Safety/medical concerns   Assist Level: Dependent - Patient 0%   Wheelchair 150 feet activity     Assist  Wheelchair 150 feet activity did not occur: Safety/medical concerns   Assist Level: Dependent - Patient 0%   Blood pressure 129/88, pulse 78, temperature (!) 97.4 F (36.3 C), temperature source Oral, resp. rate 17, height 5' 11 (1.803 m), weight 63 kg, SpO2 98%.  Medical Problem List and Plan: 1. 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  Antithrombotics: -DVT/anticoagulation:  Pharmaceutical: Lovenox  40mg  daily             -antiplatelet therapy: ASA/Plavix  X 3 weeks followed by Plavix  alone  Plavix  and aspirin  held for PEG 3. Pain Management: Tylenol  prn.  4. Mood/Behavior/Sleep: LCSW to follow for evaluation and support.             --hx of sundowning-->continue melatonin 3 mg/Monitor sleep hygeine             -antipsychotic agents: N/A -Reduced level of alertness trial amantadine - no significant changes will trial methylphenidate  10mg  BID starting noon 8/11 -8/15 patient reported to be a little more alert working with OT this  morning.  Continue current regimen 5. Neuropsych/cognition: This patient is not capable of making decisions  6. Skin/Wound Care:  Routine pressure relief measures.  7. Fluids/Electrolytes/Nutrition: Continue NPO due to holding of orals.              --on tube feeds.  Appreciate dietitian assistance. 8. HTN: Monitor BP TID--goal 150 to avoid symptoms (multiple ED visits this year)             --hx of chronic dizziness. Needs to stay hydrated.  -8/14-15 BP controlled continue to monitor Vitals:   01/12/24 0406 01/12/24 1300 01/12/24 2030 01/13/24 0529  BP: 128/78 112/82 133/82 129/82   01/13/24 1229 01/13/24 1947 01/14/24 0603 01/14/24 1533  BP: (!) 138/90 138/87 124/89 127/86   01/14/24 1927 01/15/24 0400 01/15/24 0918 01/15/24 1439  BP: (!) 143/88 111/71 135/87 129/88    9. H/o SVT/CHF s/p PPM/syncope: Monitor for orthostatic symptoms   10. BPH/urinary retention: Urecholine  and Proscar  cannot be crushed per pharmacy so will hold (cannot use Avodart  capsule due to TF).  -01/09/24 PVR documented once, 66; monitor; of note, getting urocholine 10mg  TID... -01/10/24 low PVRs but not many documented  11. 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 Glaucoma: Resume home gtts Alphagan , Trusopt , Xalatan . 13. Dysphagia: NPO with tube feeds. May need PEG for nutritional support. BUN up will increase free H20  - Patient has IR consult for PEG, CT scan completed 14. Acute renal failure: Improved from 24/1.36 to 27/0.99>> 30/0.89  - 8/14 improved at creatinine 0.79/BUN 30 15. Chronic Thrombocytopenia: stable      Latest Ref Rng & Units 01/14/2024    5:03 AM 01/11/2024    4:57 AM 01/07/2024    4:29 AM  CBC  WBC 4.0 - 10.5 K/uL 7.0  5.6  8.3   Hemoglobin 13.0 - 17.0 g/dL 84.4  84.7  84.5   Hematocrit 39.0 - 52.0 % 47.6  45.2  46.4   Platelets 150 - 400 K/uL 179  138  143      16.  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  UA + WBC but only rare bact, treat empirically keflex  500mg  BID x 3 d to see if this improves MS -No change in cognition noted   Urine blood tinged yesterday , monitor Hgb ok  Hgb is normal , mildly low plt also on asa , plavix  and enoxaparin , will monitor  May need to hold enoxaparin   8/15 urine recorded is yellow/straw, continue to monitor 18. Hx of gout: allopurinol  100mg  daily   LOS: 11 days A FACE TO FACE EVALUATION WAS PERFORMED  Murray Collier 01/15/2024, 4:57 PM

## 2024-01-15 NOTE — Progress Notes (Signed)
 Nutrition Follow-up  DOCUMENTATION CODES:   Severe malnutrition in context of chronic illness  INTERVENTION:   Continue tube feeding via Cortrak: transition to Jevity 1.5 formula today to provide fiber, will monitor tolerance Jevity 1.5 at 60 ml/h (may hold for up to 4 hours per day for therapies) Prosource TF20 60 ml once daily. Free water  flushes 200 ml q6h. Provides 1880 kcal, 96 gm protein, free water  daily (tube feed water  + FWF) Once pt gets PEG placed, can transition to bolus feeds, recommend: Jevity 1.5: 5 237 mL cartons per day (5 total feedings), 30mL water  flush before + after each bolus (8am, 11am, 2pm, 5pm, 8pm) Begin offering 1/2 carton at first 4 feeds then 1 full carton at 5th feed for the first day After first day, offer 1 full carton at each feed Free water  flushes: 4 x per day in between feeds (9am, 12pm, 3pm, 6pm)  NUTRITION DIAGNOSIS:   Severe Malnutrition related to chronic illness as evidenced by severe muscle depletion, severe fat depletion, percent weight loss (12% weight loss within 3 months); ongoing.  GOAL:   Patient will meet greater than or equal to 90% of their needs; met with TF.  MONITOR:   TF tolerance, Diet advancement  REASON FOR ASSESSMENT:   Consult    ASSESSMENT:   83 yo male admitted with functional decline r/t stroke. PMH includes HTN, CVD, BPH, CVA, former alcohol  and mixed drug abuse, glaucoma, dementia, Parkinsonism.  Spoke with pt's family at bedside (niece and sister). Discussed tube feeding at goal rate and providing all the nutrients pt needs for calories, protein, and water . Discussed transitioning from Osmolite formula to Jevity formula to provide fiber to pt and prepare pt for PEG placement/ anticipation of bolus feeds. Discussed monitoring for tolerance of fiber containing formula. Informed RN about transition. IR has been consulted for PEG placement, unsure when it will be placed at this time, but left  recommendations for transitioning to bolus feeds in interventions.  Will continue to monitor pt's tolerance of tube feeds and will return when wife is present to discuss PEG transition.  Medications reviewed and include:  Ritalin   MVI w/ minerals  Labs reviewed:  CBG x 24 hr: 131-157 mg/dL  Diet Order:   Diet Order             Diet NPO time specified  Diet effective now                   EDUCATION NEEDS:   No education needs have been identified at this time  Skin:  Skin Assessment: Reviewed RN Assessment (irritant contact dermatitis to coccyx)  Last BM:  8/14 type 5 and 6  Height:   Ht Readings from Last 1 Encounters:  01/04/24 5' 11 (1.803 m)    Weight:   Wt Readings from Last 1 Encounters:  01/04/24 63 kg    Ideal Body Weight:  78.2 kg  BMI:  Body mass index is 19.37 kg/m.  Estimated Nutritional Needs:   Kcal:  1750-1950  Protein:  80-100 gm  Fluid:  1.75-1.95 L   Josette Glance, MS, RDN, LDN Clinical Dietitian I Please reach out via secure chat

## 2024-01-15 NOTE — Telephone Encounter (Signed)
 Contacted [employer] technical support services and spoke with Oneil  This RN asked Oneil to review patient's transmission with possible RV LOC on it  Oneil told this RN that the transmission was a Periodic Follow Up transmission which includes the device running certain diagnostics/programming adjustments, but reassured this RN that the device was indeed capturing the patient's RV and that he did not see any LOC  PDF from [employer] rep interrogation performed yesterday (01/14/24) was reviewed with rep Shand and all measurements remain stable  Buckley Collier, MD and Sharlet Schmitz, PA-C who both work in the rehab center and are caring for Mr. Mcclurg were updated via secure chat in [facility] and are aware  Called and spoke with patient's spouse Harriet) and updated her as well and reassured her that the patient's device is working appropriately  Will update Dr. Waddell as well and see if he wants to still see patient once he is discharged from hospital rehab or just follow up with the patient as normally scheduled

## 2024-01-15 NOTE — Discharge Instructions (Addendum)
 Inpatient Rehab Discharge Instructions  Dustin Hamilton Discharge date and time: No discharge date for patient encounter.   Activities/Precautions/ Functional Status: Activity: no lifting, driving, or strenuous exercise for till cleared by MD Diet: Jevity 237 mL 5 times daily per tube.  Free water  200 mL 4 times daily per tube Wound Care:    Functional status:  ___ No restrictions     ___ Walk up steps independently _X__ 24/7 supervision/assistance   ___ Walk up steps with assistance ___ Intermittent supervision/assistance  ___ Bathe/dress independently ___ Walk with walker     __X_ Bathe/dress with assistance ___ Walk Independently    ___ Shower independently ___ Walk with assistance    ___ Shower with assistance _X__ No alcohol      ___ Return to work/school ________  Special Instructions: Oral care four times a day. Stay up at least 45 minutes after tube feeds.   COMMUNITY REFERRALS UPON DISCHARGE:    Home Health:   PT    OT      SP      RN                Agency:[facility]   Phone:[phone]   Medical Equipment/Items Ordered:LOW AIR LOSS HOSPITAL BED, HOYER LIFT, TIS WHEELCHAIR, TUBE FEEDINGS-JEVITY                                                 [facility]  [facility] WILL INCREASE HOURS AND WIFE WORKING ON GETTING DAYTIME CARE HAS NIGHT TIME CARE  STROKE/TIA DISCHARGE INSTRUCTIONS SMOKING Cigarette smoking nearly doubles your risk of having a stroke & is the single most alterable risk factor  If you smoke or have smoked in the last 12 months, you are advised to quit smoking for your health. Most of the excess cardiovascular risk related to smoking disappears within a year of stopping. Ask you doctor about anti-smoking medications [place] Quit Line: 1-800-QUIT NOW Free Smoking Cessation Classes [phone]  CHOLESTEROL Know your levels; limit fat & cholesterol in your diet  Lipid Panel     Component Value Date/Time   CHOL 115 12/31/2023 0351   TRIG  51 12/31/2023 0351   HDL 41 12/31/2023 0351   CHOLHDL 2.8 12/31/2023 0351   VLDL 10 12/31/2023 0351   LDLCALC 64 12/31/2023 0351     Many patients benefit from treatment even if their cholesterol is at goal. Goal: Total Cholesterol (CHOL) less than 160 Goal:  Triglycerides (TRIG) less than 150 Goal:  HDL greater than 40 Goal:  LDL (LDLCALC) less than 100   BLOOD PRESSURE American Stroke Association blood pressure target is less that 120/80 mm/Hg  Your discharge blood pressure is:  BP: 129/88 Monitor your blood pressure Limit your salt and alcohol  intake Many individuals will require more than one medication for high blood pressure  DIABETES (A1c is a blood sugar average for last 3 months) Goal HGBA1c is under 7% (HBGA1c is blood sugar average for last 3 months)  Diabetes: No known diagnosis of diabetes    Lab Results  Component Value Date   HGBA1C 5.2 12/31/2023    Your HGBA1c can be lowered with medications, healthy diet, and exercise. Check your blood sugar as directed by your physician Call your physician if you experience unexplained or low blood sugars.  PHYSICAL ACTIVITY/REHABILITATION Goal is 30 minutes at  least 4 days per week  Activity: No driving, Therapies: see above Return to work: N/A Activity decreases your risk of heart attack and stroke and makes your heart stronger.  It helps control your weight and blood pressure; helps you relax and can improve your mood. Participate in a regular exercise program. Talk with your doctor about the best form of exercise for you (dancing, walking, swimming, cycling).  DIET/WEIGHT Goal is to maintain a healthy weight  Your discharge diet is:  Diet Order             Diet NPO time specified  Diet effective now                   liquids Your height is:  Height: 5' 11 (180.3 cm) Your current weight is: Weight: 63 kg Your Body Mass Index (BMI) is:  BMI (Calculated): 19.38 Following the type of diet specifically designed for  you will help prevent another stroke. You are at goal weight    Your goal Body Mass Index (BMI) is 19-24. Healthy food habits can help reduce 3 risk factors for stroke:  High cholesterol, hypertension, and excess weight.  RESOURCES Stroke/Support Group:  Call [phone]   STROKE EDUCATION PROVIDED/REVIEWED AND GIVEN TO PATIENT Stroke warning signs and symptoms How to activate emergency medical system (call 911). Medications prescribed at discharge. Need for follow-up after discharge. Personal risk factors for stroke. Pneumonia vaccine given:  Flu vaccine given:  My questions have been answered, the writing is legible, and I understand these instructions.  I will adhere to these goals & educational materials that have been provided to me after my discharge from the hospital.     My questions have been answered and I understand these instructions. I will adhere to these goals and the provided educational materials after my discharge from the hospital.  Patient/Caregiver Signature _______________________________ Date __________  Clinician Signature _______________________________________ Date __________  Please bring this form and your medication list with you to all your follow-up doctor's appointments.          [facility] Feeding Home Regimen Jevity 1.5 cartons 5x daily; 200mL free water  4x daily 355kcal per carton of Jevity Time Formula Water   8am Jevity 1.5: 1 carton 30mL water  flush before and after bolus  9am  free water    11am Jevity 1.5: 1 carton 30mL water  flush before and after bolus  12pm  free water   2pm Jevity 1.5: 1 carton 30mL water  flush before and after bolus  3pm  free water   5pm Jevity 1.5: 1 carton 30mL water  flush before and after bolus  6pm  free water   8pm Jevity 1.5: 1 carton 30mL water  flush before and after bolus   Total Feedings: 5 total cartons of Jevity 1.5 per day Total Water  Flushes: 1,559mL per day (49mL*10 +  271mL*4) Provides: 1,775 kcal, 75 g protein,

## 2024-01-15 NOTE — Plan of Care (Signed)
  Problem: RH BOWEL ELIMINATION Goal: RH STG MANAGE BOWEL WITH ASSISTANCE Description: STG Manage Bowel with toileting min Assistance. Outcome: Progressing   Problem: RH BLADDER ELIMINATION Goal: RH STG MANAGE BLADDER WITH ASSISTANCE Description: STG Manage Bladder With min Assistance Outcome: Progressing   Problem: RH SAFETY Goal: RH STG ADHERE TO SAFETY PRECAUTIONS W/ASSISTANCE/DEVICE Description: STG Adhere to Safety Precautions With cues Assistance/Device. Outcome: Progressing   Problem: RH PAIN MANAGEMENT Goal: RH STG PAIN MANAGED AT OR BELOW [name] PAIN GOAL Description: Pain < 4 with prns Outcome: Progressing

## 2024-01-16 LAB — GLUCOSE, CAPILLARY
Glucose-Capillary: 146 mg/dL — ABNORMAL HIGH (ref 70–99)
Glucose-Capillary: 159 mg/dL — ABNORMAL HIGH (ref 70–99)
Glucose-Capillary: 168 mg/dL — ABNORMAL HIGH (ref 70–99)
Glucose-Capillary: 170 mg/dL — ABNORMAL HIGH (ref 70–99)
Glucose-Capillary: 173 mg/dL — ABNORMAL HIGH (ref 70–99)

## 2024-01-16 LAB — URINE CULTURE: Culture: >=100000 — AB

## 2024-01-16 NOTE — Progress Notes (Signed)
 Speech Language Pathology Daily Session Note  Patient Details  Name: Dustin Hamilton MRN: [id number] Date of Birth: [birth year]  Today's Date: 01/16/2024 SLP Individual Time: 1300-1400 SLP Individual Time Calculation (min): 60 min  Short Term Goals: Week 2: SLP Short Term Goal 1 (Week 2): Patient will initiate pharyngeal swallow in 80% of trials given max multimodal A SLP Short Term Goal 2 (Week 2): Patient will follow single step commands in functional tasks given max multimodal A SLP Short Term Goal 3 (Week 2): Patient will verbalize single words in 50% of opportunities given max multimodal A SLP Short Term Goal 4 (Week 2): Patient will sustain attention during functional tasks for 20 seconds given max multimodal A  Skilled Therapeutic Interventions: Pt seen for skilled ST with focus on swallowing and communication goals, pt sitting upright in wheelchair and nods head in agreement for therapeutic tasks. Pt initially offered ice chips but shakes his head no. SLP facilitating verbalization of automatics by providing max A cues fading to min for numbers, DOW, MOY, name and birthdate. Pt counting 1-100, only skipping 21-25 and requiring 1 cue to produce 80. Pt verbalizing DOW and MOY with overall 90% accuracy (pt would cycle through DOW and MOY repeating). Pt producing social language such as no ma'am, able to state he lives in [place] and he likes to play sports. Pt does continue to require consistent verbal cues to increase vocal intensity to increase overall intelligibility. Pt shown objects from [employer], able to correctly name 2/8 items with overall mod A cues before perseverating on it's for my foot after cues for sock. Handing patient target object to hold increased focused attention during task. Intelligibility during automatics and 1-3 word utterances ~65%. Pt noted to fatigue at this point and requires max cues for remainder of simple speech tasks. Pt agreeable to  oral care and limited ice chips, SLP performing oral care for thoroughness. Pt demonstrates overt s/s aspiration on 2/3 ice chip trails, pt defers any further trials at this time. Of note, pt with L side anterior loss of saliva throughout speech tasks without awareness. Pt left in wheelchair with alarm set and all needs within reach. Cont ST POC.   Pain Pain Assessment Pain Scale: 0-10 Pain Score: 0-No pain  Therapy/Group: Individual Therapy  Andriette JONELLE Rattler 01/16/2024, 1:33 PM

## 2024-01-16 NOTE — Progress Notes (Signed)
 Physical Therapy Session Note  Patient Details  Name: Dustin Hamilton MRN: [id number] Date of Birth: 10-26-1940  Today's Date: 01/16/2024 PT Individual Time: 1446-1531 PT Individual Time Calculation (min): 45 min   Short Term Goals: Week 1:  PT Short Term Goal 1 (Week 1): Pt will perform bed rolling with MaxA +1. PT Short Term Goal 1 - Progress (Week 1): Not met PT Short Term Goal 2 (Week 1): Pt will perform supine<>sit with MaxA +1. PT Short Term Goal 2 - Progress (Week 1): Not met PT Short Term Goal 3 (Week 1): Pt will perform sit<>stand with MaxA +2. PT Short Term Goal 3 - Progress (Week 1): Met PT Short Term Goal 4 (Week 1): Pt will maintain unsupported seated balance with MinA for more than 3 min. PT Short Term Goal 4 - Progress (Week 1): Progressing toward goal PT Short Term Goal 5 (Week 1): Pt will demonstrate improved initiation by following more than 50% of verbal instructions for mobility. PT Short Term Goal 5 - Progress (Week 1): Progressing toward goal  Skilled Therapeutic Interventions/Progress Updates:  Pt was seen bedside in the pm sitting up in tilt in space w/c. Pt transported to rehab gym. Pt interacting with therapist with increased processing time. Initially worked in parallel, attempted sit to stand x 2 however pt unable to stand with total A. Pt attempted to initiate with lower extremities however unable to stand up. Attempted sit to stand with stedy, pt position R arm on stedy with verbal cues. Again attempted to stand with +2 assist however unable. Pt initially attempting to assist with lower extremities however unable to stand. Pt returned to room. Pt transferred w/c to edge of bed with total A and verbal cues. On edge of bed, treatment focused on NMR. On edge of bed pt leans to the L however able to maintain with min A and truncal tone. Treatment focused on transitioning from upright to propped on R elbow. Pt able to maintain sitting propped on R elbow with close  supervision for short periods. Pt transitioned between propped on R elbow and upright x 5. Pt transferred of bed to supine with + 2 assist. Pt moved up in bed with + 2 assist. Pt rolled with max A and verbal cues. Pt left sitting up in bed with wife at bedside and call bell within reach.   Therapy Documentation Precautions:  Precautions Precautions: Fall Recall of Precautions/Restrictions: Impaired Precaution/Restrictions Comments: L hemipareisis, NPO, CorTrak Restrictions Weight Bearing Restrictions Per Provider Order: No General:   Pain Assessment Pain Scale: 0-10 Pain Score: 0-No pain  Therapy/Group: Individual Therapy  [name] G 01/16/2024, 3:57 PM

## 2024-01-16 NOTE — Progress Notes (Signed)
 Occupational Therapy Session Note  Patient Details  Name: Dustin Hamilton MRN: [id number] Date of Birth: 01-19-1941  Today's Date: 01/16/2024 OT Individual Time: 8969-8886 OT Individual Time Calculation (min): 43 min    Short Term Goals: Week 1:  OT Short Term Goal 1 (Week 1): Pt will follow single step command during ADLs with verbal cues only OT Short Term Goal 1 - Progress (Week 1): Not met OT Short Term Goal 2 (Week 1): Pt will locate 5/5 obejcts in L visual field with SUP OT Short Term Goal 2 - Progress (Week 1): Not met OT Short Term Goal 3 (Week 1): Pt will maintain sitting balance in preparation for functional transfes with CGA OT Short Term Goal 3 - Progress (Week 1): Not met OT Short Term Goal 4 (Week 1): Pt will complete one step of dressing task with SUP OT Short Term Goal 4 - Progress (Week 1): Not met Week 2:  OT Short Term Goal 1 (Week 2): Pt will follow single step command during ADL with MOD multimodal cues OT Short Term Goal 2 (Week 2): Pt will tolerate sitting up in wc between therapy session 3/7 days OT Short Term Goal 3 (Week 2): Pt will maintain attention to familiar ADL for 30 sec with SUP OT Short Term Goal 4 (Week 2): Pt will maintain sitting balance EOB in preparation for functional transfer with MIN A consistently   Skilled Therapeutic Interventions/Progress Updates:    Pt bed level at time of session, had not been up this date and able to shake head no when asked if in pain. Note family not present this date. Bed level brief change 2/2 incontinent BM, dependently changed bed level with 2 assist for rolling. Pants donned bed level dependently, 2 helpers. Supine > sit with total A and MIN/MOD from 2nd helper as well, sitting EOB with MOD/MAX for truncal support. Donning shirt with total A, 2nd helper supporting trunk and pt noted to have some movement in R hand when attempting to thread arms. Squat pivot total A x2 to wheelchair, set up reclined in TIS alarm on  call bell in reach all needs met.   Note encouarged pt to attempt all tasks and provided extended time but minimal participation but able to follow simple commands like holding bed rail and reaching for arm rest.   Therapy Documentation Precautions:  Precautions Precautions: Fall Recall of Precautions/Restrictions: Impaired Precaution/Restrictions Comments: L hemipareisis, NPO, CorTrak Restrictions Weight Bearing Restrictions Per Provider Order: No     Therapy/Group: Individual Therapy  Chiquita JAYSON Hopping 01/16/2024, 8:23 AM

## 2024-01-16 NOTE — Plan of Care (Signed)
  Problem: Consults Goal: RH STROKE PATIENT EDUCATION Description: See Patient Education module for education specifics  Outcome: Progressing   Problem: RH BOWEL ELIMINATION Goal: RH STG MANAGE BOWEL WITH ASSISTANCE Description: STG Manage Bowel with toileting min Assistance. Outcome: Progressing Goal: RH STG MANAGE BOWEL W/MEDICATION W/ASSISTANCE Description: STG Manage Bowel with Medication with mod I Assistance. Outcome: Progressing   Problem: RH BLADDER ELIMINATION Goal: RH STG MANAGE BLADDER WITH ASSISTANCE Description: STG Manage Bladder With min Assistance Outcome: Progressing Goal: RH STG MANAGE BLADDER WITH MEDICATION WITH ASSISTANCE Description: STG Manage Bladder With Medication With mod I Assistance. Outcome: Progressing   Problem: RH SAFETY Goal: RH STG ADHERE TO SAFETY PRECAUTIONS W/ASSISTANCE/DEVICE Description: STG Adhere to Safety Precautions With cues Assistance/Device. Outcome: Progressing   Problem: RH COGNITION-NURSING Goal: RH STG USES MEMORY AIDS/STRATEGIES W/ASSIST TO PROBLEM SOLVE Description: STG Uses Memory Aids/Strategies With min Assistance to Problem Solve. Outcome: Progressing   Problem: RH PAIN MANAGEMENT Goal: RH STG PAIN MANAGED AT OR BELOW [name] PAIN GOAL Description: Pain < 4 with prns Outcome: Progressing   Problem: RH KNOWLEDGE DEFICIT Goal: RH STG INCREASE KNOWLEDGE OF HYPERTENSION Description: Patient and wife will be able to manage HTN using educational resources for medications and dietary modification independently Outcome: Progressing Goal: RH STG INCREASE KNOWLEDGE OF DYSPHAGIA/FLUID INTAKE Description: Patient and wife will be able to manage dysphagia using educational resources for medications and dietary modification independentl Outcome: Progressing Goal: RH STG INCREASE KNOWLEDGE OF STROKE PROPHYLAXIS Description: Patient and wife will be able to manage secondary risks using educational resources for medications and dietary  modification independentl Outcome: Progressing

## 2024-01-16 NOTE — Progress Notes (Signed)
 PROGRESS NOTE   Subjective/Complaints:  Pt with eyes open but does not engage with examiner. Doesn't answer any questions. No indication of discomfort. LBM documented yesterday.    ROS- limited by cognition   Objective:   No results found.  Recent Labs    01/14/24 0503  WBC 7.0  HGB 15.5  HCT 47.6  PLT 179    Recent Labs    01/14/24 0503  NA 140  K 4.4  CL 105  CO2 29  GLUCOSE 137*  BUN 30*  CREATININE 0.79  CALCIUM  9.7     Intake/Output Summary (Last 24 hours) at 01/16/2024 1148 Last data filed at 01/16/2024 0932 Gross per 24 hour  Intake 600 ml  Output 1200 ml  Net -600 ml        Physical Exam: Vital Signs Blood pressure 118/86, pulse 79, temperature 97.8 F (36.6 C), resp. rate 17, height 5' 11 (1.803 m), weight 63 kg, SpO2 96%.  General: No acute distress, laying in bed ENT- cortrak in nares  Masked facies  Mood and affect flat, eyes open but doesn't engage Heart: Regular rate and rhythm no rubs murmurs or extra sounds Lungs: Clear to auscultation bilaterally though poor inspiratory effort, nonlabored breathing Abdomen: Positive bowel sounds, soft nontender to palpation, nondistended Extremities: No clubbing, cyanosis, or edema Skin: No evidence of breakdown, no evidence of rash over exposed surfaces Neuro: eyes open and follows very simple command (squeeze hand) but otherwise doesn't engage much  PRIOR EXAMS: Neurologic: masked facies, eyes closed , no spontaneous movement . motor strength is 4/5 in right deltoid, bicep, tricep, grip, 2-/5 LUE and LLE- MMT varies based on LOA - lost focus during LE MMT with limited attempt despite cuing  Sensory exam limited by attention and concentration  Cerebellar exam could not cooperate  Musculoskeletal: no pain with hip or knee ROM BLE   Assessment/Plan: 1. Functional deficits which require 3+ hours per day of interdisciplinary therapy in a  comprehensive inpatient rehab setting. Physiatrist is providing close team supervision and 24 hour management of active medical problems listed below. Physiatrist and rehab team continue to assess barriers to discharge/monitor patient progress toward functional and medical goals  Care Tool:  Bathing    Body parts bathed by patient: Face   Body parts bathed by helper: Left arm, Abdomen, Chest, Right arm, Right upper leg, Left upper leg     Bathing assist Assist Level: 2 Helpers     Upper Body Dressing/Undressing Upper body dressing   What is the patient wearing?: Pull over shirt    Upper body assist Assist Level: 2 Helpers    Lower Body Dressing/Undressing Lower body dressing      What is the patient wearing?: Underwear/pull up, Pants     Lower body assist Assist for lower body dressing: 2 Helpers     Toileting Toileting    Toileting assist Assist for toileting: Dependent - Patient 0%     Transfers Chair/bed transfer  Transfers assist  Chair/bed transfer activity did not occur: Safety/medical concerns  Chair/bed transfer assist level: Dependent - Patient 0%     Locomotion Ambulation   Ambulation assist   Ambulation activity did  not occur: Safety/medical concerns  Assist level: 2 helpers Assistive device: Other (comment) (3 musketeer support) Max distance: 6 ft   Walk 10 feet activity   Assist  Walk 10 feet activity did not occur: Safety/medical concerns        Walk 50 feet activity   Assist Walk 50 feet with 2 turns activity did not occur: Safety/medical concerns         Walk 150 feet activity   Assist Walk 150 feet activity did not occur: Safety/medical concerns         Walk 10 feet on uneven surface  activity   Assist Walk 10 feet on uneven surfaces activity did not occur: Safety/medical concerns         Wheelchair     Assist Is the patient using a wheelchair?: Yes Type of Wheelchair: Manual Wheelchair activity  did not occur: Safety/medical concerns  Wheelchair assist level: Dependent - Patient 0%      Wheelchair 50 feet with 2 turns activity    Assist    Wheelchair 50 feet with 2 turns activity did not occur: Safety/medical concerns   Assist Level: Dependent - Patient 0%   Wheelchair 150 feet activity     Assist  Wheelchair 150 feet activity did not occur: Safety/medical concerns   Assist Level: Dependent - Patient 0%   Blood pressure 118/86, pulse 79, temperature 97.8 F (36.6 C), resp. rate 17, height 5' 11 (1.803 m), weight 63 kg, SpO2 96%.  Medical Problem List and Plan: 1. 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  Antithrombotics: -DVT/anticoagulation:  Pharmaceutical: Lovenox  40mg  daily             -antiplatelet therapy: ASA/Plavix  X 3 weeks followed by Plavix  alone  Plavix  and aspirin  held for PEG 3. Pain Management: Tylenol  prn.  4. Mood/Behavior/Sleep: LCSW to follow for evaluation and support.             --hx of sundowning-->continue melatonin 3 mg/Monitor sleep hygeine             -antipsychotic agents: N/A -Reduced level of alertness trial amantadine - no significant changes will trial methylphenidate  10mg  BID starting noon 8/11 -8/15 patient reported to be a little more alert working with OT this morning.  Continue current regimen 5. Neuropsych/cognition: This patient is not capable of making decisions  6. Skin/Wound Care:  Routine pressure relief measures.  7. Fluids/Electrolytes/Nutrition: Continue NPO due to holding of orals.              --on tube feeds.  Appreciate dietitian assistance. 8. HTN: Monitor BP TID--goal 150 to avoid symptoms  (multiple ED visits this year)             --hx of chronic dizziness. Needs to stay hydrated.  -8/14-16 BP controlled continue to monitor Vitals:   01/12/24 2030 01/13/24 0529 01/13/24 1229 01/13/24 1947  BP: 133/82 129/82 (!) 138/90 138/87   01/14/24 0603 01/14/24 1533 01/14/24 1927 01/15/24 0400  BP: 124/89 127/86 (!) 143/88 111/71   01/15/24 0918 01/15/24 1439 01/15/24 1911 01/16/24 0454  BP: 135/87 129/88 107/62 118/86  9. H/o SVT/CHF s/p PPM/syncope: Monitor for orthostatic symptoms   10. BPH/urinary retention: Urecholine  and Proscar  cannot be crushed per pharmacy so will hold (cannot use Avodart  capsule due to TF).  -01/09/24 PVR documented once, 66; monitor; of note, getting urocholine 10mg  TID... -01/10/24 low PVRs but not many documented  11. 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 Glaucoma: Resume home gtts Alphagan , Trusopt , Xalatan . 13. Dysphagia: NPO with tube feeds. May need PEG for nutritional support. BUN up will increase free H20  - Patient has IR consult for PEG, CT scan completed 14. Acute renal failure: Improved from 24/1.36 to 27/0.99>> 30/0.89  - 8/14 improved at creatinine 0.79/BUN 30 15. Chronic Thrombocytopenia: stable/improving      Latest Ref Rng & Units 01/14/2024    5:03 AM 01/11/2024    4:57 AM 01/07/2024    4:29 AM  CBC  WBC 4.0 - 10.5 K/uL 7.0  5.6  8.3   Hemoglobin 13.0 - 17.0 g/dL 84.4  84.7  84.5   Hematocrit 39.0 - 52.0 % 47.6  45.2  46.4   Platelets 150 - 400 K/uL 179  138  143      16.  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  UA + WBC but only rare bact, treat empirically keflex  500mg  BID x 3 d to see if this improves MS -No change in cognition noted  Urine blood tinged yesterday , monitor Hgb ok  Hgb is normal , mildly low plt also on asa , plavix  and enoxaparin , will monitor  May need to  hold enoxaparin   8/15 urine recorded is yellow/straw, continue to monitor  18. Hx of gout: allopurinol  100mg  daily   LOS: 12 days A FACE TO FACE EVALUATION WAS PERFORMED  [address] 01/16/2024, 11:48 AM

## 2024-01-16 NOTE — Plan of Care (Signed)
  Problem: RH BLADDER ELIMINATION Goal: RH STG MANAGE BLADDER WITH ASSISTANCE Description: STG Manage Bladder With min Assistance Outcome: Progressing   Problem: RH SAFETY Goal: RH STG ADHERE TO SAFETY PRECAUTIONS W/ASSISTANCE/DEVICE Description: STG Adhere to Safety Precautions With cues Assistance/Device. Outcome: Progressing   Problem: RH PAIN MANAGEMENT Goal: RH STG PAIN MANAGED AT OR BELOW [name] PAIN GOAL Description: Pain < 4 with prns Outcome: Progressing

## 2024-01-17 LAB — GLUCOSE, CAPILLARY
Glucose-Capillary: 156 mg/dL — ABNORMAL HIGH (ref 70–99)
Glucose-Capillary: 145 mg/dL — ABNORMAL HIGH (ref 70–99)
Glucose-Capillary: 152 mg/dL — ABNORMAL HIGH (ref 70–99)
Glucose-Capillary: 127 mg/dL — ABNORMAL HIGH (ref 70–99)

## 2024-01-17 NOTE — Progress Notes (Signed)
 Patient LDA was updated to have external cath- Condom cath.  Fredie LITTIE Morones, RN

## 2024-01-17 NOTE — Progress Notes (Signed)
 Occupational Therapy Session Note  Patient Details  Name: [name] MRN: [id number] Date of Birth: February 11, 1941  Today's Date: 01/17/2024 OT Individual Time: 0100-0145 OT Individual Time Calculation (min): 45 min    Short Term Goals: Week 1:  OT Short Term Goal 1 (Week 1): Pt will follow single step command during ADLs with verbal cues only OT Short Term Goal 1 - Progress (Week 1): Not met OT Short Term Goal 2 (Week 1): Pt will locate 5/5 obejcts in L visual field with SUP OT Short Term Goal 2 - Progress (Week 1): Not met OT Short Term Goal 3 (Week 1): Pt will maintain sitting balance in preparation for functional transfes with CGA OT Short Term Goal 3 - Progress (Week 1): Not met OT Short Term Goal 4 (Week 1): Pt will complete one step of dressing task with SUP OT Short Term Goal 4 - Progress (Week 1): Not met  Skilled Therapeutic Interventions/Progress Updates:    Patient resting in bed at the time of arrival with nursing present administering medication.Patient presents with BP of105/76 , 02  level of  98% on room air.  The Rehab Tech was able to assist in bring the pt from supine in bed to EOB with MaxAx2.  The pt was positioned EOB with pillows for additional support, however, I  found it easier to position him with a medium  therapy ball, which allowed me to make adjustments in his positioning. The pt presented with extremely high tone throughout his truck in relation to his shld's, neck and back region.  I completed, manual manipulation of his scapular and surrounding structure to improve communication  throughout his extremities. The pt was able to gesture that he felt better by nodding. PROM was performed with BUE for shld flexion, horizontal abduction, supination and pronation, and ulnar and radial deviation.  Following PROM of the pt's UB,  he was able to retreive 3 cones with his R hand with attention to task performance.  At the end of the session, the pt was placed supine  in  bed with MaxAx2 and repositioned up in bed at  MaxA x2.  The call light and bedside table were both placed within reach with all additional needs addressed prior to exiting the room.    Therapy Documentation Precautions:  Precautions Precautions: Fall Recall of Precautions/Restrictions: Impaired Precaution/Restrictions Comments: L hemipareisis, NPO, CorTrak Restrictions Weight Bearing Restrictions Per Provider Order: No Therapy/Group: Individual Therapy  [name] 01/17/2024, 5:07 PM

## 2024-01-17 NOTE — Progress Notes (Signed)
 Physical Therapy Session Note  Patient Details  Name: Dustin Hamilton MRN: [id number] Date of Birth: Nov 19, 1940  Today's Date: 01/17/2024 PT Individual Time: 1015-1100 PT Individual Time Calculation (min): 45 min   Short Term Goals: Week 1:  PT Short Term Goal 1 (Week 1): Pt will perform bed rolling with MaxA +1. PT Short Term Goal 1 - Progress (Week 1): Not met PT Short Term Goal 2 (Week 1): Pt will perform supine<>sit with MaxA +1. PT Short Term Goal 2 - Progress (Week 1): Not met PT Short Term Goal 3 (Week 1): Pt will perform sit<>stand with MaxA +2. PT Short Term Goal 3 - Progress (Week 1): Met PT Short Term Goal 4 (Week 1): Pt will maintain unsupported seated balance with MinA for more than 3 min. PT Short Term Goal 4 - Progress (Week 1): Progressing toward goal PT Short Term Goal 5 (Week 1): Pt will demonstrate improved initiation by following more than 50% of verbal instructions for mobility. PT Short Term Goal 5 - Progress (Week 1): Progressing toward goal  Skilled Therapeutic Interventions/Progress Updates:  Pt was seen bedside in the am. Dependent to don pants. Pt assisted rolling with total A and verbal cues with increased processing time. Pt transferred supine to edge of bed with total A. Pt assisted with bringing R LE off the bed with increased processing time. On edge of bed, pt tends to lean posteriorly with min A to maintain and verbal cues. Pt dependent to don shirt. Pt transferred edge of bed to w/c with squat pivot total A, pt attempted to push through LE to assist with transfer. Pt transported to gym. Attempted to stand in parallel bars with total A, pt able to clear buttocks off w/c but unable to stand. Pt transferred w/c to edge of mat with total A and verbal cues, pt attempting to assist by pushing R UE and pushing through LE with verbal cues and increased processing time. Once on mat, treatment focused on NMR. Pt transitioned from sitting upright to propped on R elbow  x 3. Pt able to maintain upright posture with S to contact guard and propped on R elbow with S. Pt demonstrated assistance with transition between upright and propped on R elbow. Pt transferred back to w/c with squat pivot total A. Pt transported back to room. Pt transferred w/c to edge of bed with total A. Pt transferred edge of bed to supine with total A. Pt left sitting up in bed with all needs within reach.   Therapy Documentation Precautions:  Precautions Precautions: Fall Recall of Precautions/Restrictions: Impaired Precaution/Restrictions Comments: L hemipareisis, NPO, CorTrak Restrictions Weight Bearing Restrictions Per Provider Order: No General:   Vital Signs:   Pain: No signs of pain noted during treatment.    Therapy/Group: Individual Therapy  Axzel Rockhill G 01/17/2024, 12:15 PM

## 2024-01-17 NOTE — Plan of Care (Signed)
  Problem: RH BLADDER ELIMINATION Goal: RH STG MANAGE BLADDER WITH ASSISTANCE Description: STG Manage Bladder With min Assistance Outcome: Progressing   Problem: RH SAFETY Goal: RH STG ADHERE TO SAFETY PRECAUTIONS W/ASSISTANCE/DEVICE Description: STG Adhere to Safety Precautions With cues Assistance/Device. Outcome: Progressing   Problem: RH PAIN MANAGEMENT Goal: RH STG PAIN MANAGED AT OR BELOW [name] PAIN GOAL Description: Pain < 4 with prns Outcome: Progressing

## 2024-01-17 NOTE — Plan of Care (Signed)
  Problem: Consults Goal: RH STROKE PATIENT EDUCATION Description: See Patient Education module for education specifics  Outcome: Progressing   Problem: RH BOWEL ELIMINATION Goal: RH STG MANAGE BOWEL WITH ASSISTANCE Description: STG Manage Bowel with toileting min Assistance. Outcome: Progressing Goal: RH STG MANAGE BOWEL W/MEDICATION W/ASSISTANCE Description: STG Manage Bowel with Medication with mod I Assistance. Outcome: Progressing   Problem: RH BLADDER ELIMINATION Goal: RH STG MANAGE BLADDER WITH ASSISTANCE Description: STG Manage Bladder With min Assistance Outcome: Progressing Goal: RH STG MANAGE BLADDER WITH MEDICATION WITH ASSISTANCE Description: STG Manage Bladder With Medication With mod I Assistance. Outcome: Progressing   Problem: RH SAFETY Goal: RH STG ADHERE TO SAFETY PRECAUTIONS W/ASSISTANCE/DEVICE Description: STG Adhere to Safety Precautions With cues Assistance/Device. Outcome: Progressing   Problem: RH COGNITION-NURSING Goal: RH STG USES MEMORY AIDS/STRATEGIES W/ASSIST TO PROBLEM SOLVE Description: STG Uses Memory Aids/Strategies With min Assistance to Problem Solve. Outcome: Progressing   Problem: RH PAIN MANAGEMENT Goal: RH STG PAIN MANAGED AT OR BELOW [name] PAIN GOAL Description: Pain < 4 with prns Outcome: Progressing   Problem: RH KNOWLEDGE DEFICIT Goal: RH STG INCREASE KNOWLEDGE OF HYPERTENSION Description: Patient and wife will be able to manage HTN using educational resources for medications and dietary modification independently Outcome: Progressing Goal: RH STG INCREASE KNOWLEDGE OF DYSPHAGIA/FLUID INTAKE Description: Patient and wife will be able to manage dysphagia using educational resources for medications and dietary modification independentl Outcome: Progressing Goal: RH STG INCREASE KNOWLEDGE OF STROKE PROPHYLAXIS Description: Patient and wife will be able to manage secondary risks using educational resources for medications and dietary  modification independentl Outcome: Progressing

## 2024-01-17 NOTE — Progress Notes (Signed)
 PROGRESS NOTE   Subjective/Complaints:  Pt doing ok, nods his head when asked if he slept ok, shakes his head no when asked if he has any pain. LBM documented around midnight last night. Condom cath output fine per documentation. No other complaints or concerns but cognition limits HPI.    ROS- limited by cognition   Objective:   No results found.  No results for input(s): WBC, HGB, HCT, PLT in the last 72 hours.   No results for input(s): NA, K, CL, CO2, GLUCOSE, BUN, CREATININE, CALCIUM  in the last 72 hours.    Intake/Output Summary (Last 24 hours) at 01/17/2024 1025 Last data filed at 01/17/2024 0541 Gross per 24 hour  Intake 2183 ml  Output 1005 ml  Net 1178 ml        Physical Exam: Vital Signs Blood pressure 126/88, pulse 73, temperature 97.9 F (36.6 C), temperature source Oral, resp. rate 16, height 5' 11 (1.803 m), weight 63 kg, SpO2 94%.  General: No acute distress, laying in bed, awakens to voice ENT- cortrak in nares  Masked facies  Mood and affect flat, opens eyes on command, minimally interactive Heart: Regular rate and rhythm no rubs murmurs or extra sounds Lungs: Clear to auscultation bilaterally though poor inspiratory effort, nonlabored breathing Abdomen: Positive bowel sounds, soft nontender to palpation, nondistended Extremities: No clubbing, cyanosis, or edema Skin: No evidence of breakdown, no evidence of rash over exposed surfaces Neuro: eyes open and follows very simple command (to open eyes), nods/shakes head more today, but still minimally interactive.   PRIOR EXAMS: Neurologic: masked facies, eyes closed , no spontaneous movement . motor strength is 4/5 in right deltoid, bicep, tricep, grip, 2-/5 LUE and LLE- MMT varies based on LOA - lost focus during LE MMT with limited attempt despite cuing  Sensory exam limited by attention and concentration  Cerebellar exam  could not cooperate  Musculoskeletal: no pain with hip or knee ROM BLE   Assessment/Plan: 1. Functional deficits which require 3+ hours per day of interdisciplinary therapy in a comprehensive inpatient rehab setting. Physiatrist is providing close team supervision and 24 hour management of active medical problems listed below. Physiatrist and rehab team continue to assess barriers to discharge/monitor patient progress toward functional and medical goals  Care Tool:  Bathing    Body parts bathed by patient: Face   Body parts bathed by helper: Left arm, Abdomen, Chest, Right arm, Right upper leg, Left upper leg     Bathing assist Assist Level: 2 Helpers     Upper Body Dressing/Undressing Upper body dressing   What is the patient wearing?: Pull over shirt    Upper body assist Assist Level: 2 Helpers    Lower Body Dressing/Undressing Lower body dressing      What is the patient wearing?: Underwear/pull up, Pants     Lower body assist Assist for lower body dressing: 2 Helpers     Toileting Toileting    Toileting assist Assist for toileting: Dependent - Patient 0%     Transfers Chair/bed transfer  Transfers assist  Chair/bed transfer activity did not occur: Safety/medical concerns  Chair/bed transfer assist level: Total Assistance - Patient < 25%  Locomotion Ambulation   Ambulation assist   Ambulation activity did not occur: Safety/medical concerns  Assist level: 2 helpers Assistive device: Other (comment) (3 musketeer support) Max distance: 6 ft   Walk 10 feet activity   Assist  Walk 10 feet activity did not occur: Safety/medical concerns        Walk 50 feet activity   Assist Walk 50 feet with 2 turns activity did not occur: Safety/medical concerns         Walk 150 feet activity   Assist Walk 150 feet activity did not occur: Safety/medical concerns         Walk 10 feet on uneven surface  activity   Assist Walk 10 feet on  uneven surfaces activity did not occur: Safety/medical concerns         Wheelchair     Assist Is the patient using a wheelchair?: Yes Type of Wheelchair: Manual Wheelchair activity did not occur: Safety/medical concerns  Wheelchair assist level: Dependent - Patient 0%      Wheelchair 50 feet with 2 turns activity    Assist    Wheelchair 50 feet with 2 turns activity did not occur: Safety/medical concerns   Assist Level: Dependent - Patient 0%   Wheelchair 150 feet activity     Assist  Wheelchair 150 feet activity did not occur: Safety/medical concerns   Assist Level: Dependent - Patient 0%   Blood pressure 126/88, pulse 73, temperature 97.9 F (36.6 C), temperature source Oral, resp. rate 16, height 5' 11 (1.803 m), weight 63 kg, SpO2 94%.  Medical Problem List and Plan: 1. 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  Antithrombotics: -DVT/anticoagulation:  Pharmaceutical: Lovenox  40mg  daily             -antiplatelet therapy: ASA/Plavix  X 3 weeks followed by Plavix  alone Plavix  and aspirin  held for PEG-- lovenox  would need stopping too? WEEKDAY TEAM TO HOLD ONCE PEG SCHEDULED 3. Pain Management: Tylenol  prn.  4. Mood/Behavior/Sleep: LCSW to follow for evaluation and support.             --hx of sundowning-->continue melatonin 3 mg/Monitor sleep hygeine             -antipsychotic agents: N/A -Reduced level of alertness trial amantadine - no significant changes will trial methylphenidate  10mg  BID starting noon 8/11 -8/15 patient reported to be a little more alert working with OT this morning.  Continue current regimen 5. Neuropsych/cognition: This  patient is not capable of making decisions  6. Skin/Wound Care:  Routine pressure relief measures.  7. Fluids/Electrolytes/Nutrition: Continue NPO due to holding of orals.              --on tube feeds.  Appreciate dietitian assistance. 8. HTN: Monitor BP TID--goal 150 to avoid symptoms (multiple ED visits this year)             --hx of chronic dizziness. Needs to stay hydrated.  -8/14-17 BP controlled continue to monitor Vitals:   01/13/24 1947 01/14/24 0603 01/14/24 1533 01/14/24 1927  BP: 138/87 124/89 127/86 (!) 143/88   01/15/24 0400 01/15/24 0918 01/15/24 1439 01/15/24  1911  BP: 111/71 135/87 129/88 107/62   01/16/24 0454 01/16/24 1552 01/16/24 2001 01/17/24 0454  BP: 118/86 102/65 113/69 126/88    9. H/o SVT/CHF s/p PPM/syncope: Monitor for orthostatic symptoms   10. BPH/urinary retention: Urecholine  and Proscar  cannot be crushed per pharmacy so will hold (cannot use Avodart  capsule due to TF).  -01/09/24 PVR documented once, 66; monitor; of note, getting urocholine 10mg  TID... -01/10/24 low PVRs but not many documented -01/17/24 poor documentation of PVRs, but using condom cath and output looks ok. Monitor.   11. 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 Glaucoma: Resume home gtts Alphagan , Trusopt , Xalatan . 13. Dysphagia: NPO with tube feeds. May need PEG for nutritional support. BUN up will increase free H20  - Patient has IR consult for PEG, CT scan completed 14. Acute renal failure: Improved from 24/1.36 to 27/0.99>> 30/0.89  - 8/14 improved at creatinine 0.79/BUN 30  15. Chronic Thrombocytopenia: stable/improving      Latest Ref Rng & Units 01/14/2024    5:03 AM 01/11/2024    4:57 AM 01/07/2024    4:29 AM  CBC  WBC 4.0 - 10.5 K/uL 7.0  5.6  8.3   Hemoglobin 13.0 - 17.0 g/dL 84.4  84.7  84.5   Hematocrit 39.0 - 52.0 % 47.6  45.2  46.4   Platelets 150 - 400 K/uL 179  138  143      16.   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  UA + WBC but only rare bact, treat empirically keflex  500mg  BID x 3 d to see if this improves MS -No change in cognition noted  Urine blood tinged yesterday , monitor Hgb ok  Hgb is normal , mildly low plt also on asa , plavix  and enoxaparin , will monitor  May need to hold enoxaparin   8/15 urine recorded is yellow/straw, continue to monitor  18. Hx of gout: allopurinol  100mg  daily   LOS: 13 days A FACE TO FACE EVALUATION WAS PERFORMED  [address] 01/17/2024, 10:25 AM

## 2024-01-18 DIAGNOSIS — R339 Retention of urine, unspecified: Secondary | ICD-10-CM

## 2024-01-18 LAB — BASIC METABOLIC PANEL WITH GFR
Anion gap: 10 (ref 5–15)
BUN: 32 mg/dL — ABNORMAL HIGH (ref 8–23)
CO2: 26 mmol/L (ref 22–32)
Calcium: 9.3 mg/dL (ref 8.9–10.3)
Chloride: 103 mmol/L (ref 98–111)
Creatinine, Ser: 0.83 mg/dL (ref 0.61–1.24)
GFR, Estimated: >60 mL/min (ref >60)
Glucose, Bld: 138 mg/dL — ABNORMAL HIGH (ref 70–99)
Potassium: 4 mmol/L (ref 3.5–5.1)
Sodium: 139 mmol/L (ref 135–145)

## 2024-01-18 LAB — GLUCOSE, CAPILLARY
Glucose-Capillary: 144 mg/dL — ABNORMAL HIGH (ref 70–99)
Glucose-Capillary: 159 mg/dL — ABNORMAL HIGH (ref 70–99)
Glucose-Capillary: 140 mg/dL — ABNORMAL HIGH (ref 70–99)
Glucose-Capillary: 119 mg/dL — ABNORMAL HIGH (ref 70–99)
Glucose-Capillary: 142 mg/dL — ABNORMAL HIGH (ref 70–99)
Glucose-Capillary: 118 mg/dL — ABNORMAL HIGH (ref 70–99)

## 2024-01-18 LAB — CBC
HCT: 42.1 % (ref 39.0–52.0)
Hemoglobin: 13.9 g/dL (ref 13.0–17.0)
MCH: 31.2 pg (ref 26.0–34.0)
MCHC: 33 g/dL (ref 30.0–36.0)
MCV: 94.6 fL (ref 80.0–100.0)
Platelets: 213 K/uL (ref 150–400)
RBC: 4.45 MIL/uL (ref 4.22–5.81)
RDW: 13.2 % (ref 11.5–15.5)
WBC: 5.6 K/uL (ref 4.0–10.5)
nRBC: 0 % (ref 0.0–0.2)

## 2024-01-18 MED ORDER — ENOXAPARIN SODIUM 40 MG/0.4ML IJ SOSY
40.0000 mg | PREFILLED_SYRINGE | INTRAMUSCULAR | Status: DC
  Administered 2024-01-19 – 2024-01-28 (×10): 40 mg via SUBCUTANEOUS
  Filled 2024-01-18 (×10): qty 0.4

## 2024-01-18 MED ORDER — CEFAZOLIN SODIUM-DEXTROSE 2-4 GM/100ML-% IV SOLN: 2.0000 g | INTRAVENOUS | Status: DC

## 2024-01-18 MED ORDER — VANCOMYCIN HCL IN DEXTROSE 1-5 GM/200ML-% IV SOLN: 1000.0000 mg | Freq: Once | INTRAVENOUS | Status: DC

## 2024-01-18 MED ORDER — VANCOMYCIN HCL IN DEXTROSE 1-5 GM/200ML-% IV SOLN
1000.0000 mg | Freq: Once | INTRAVENOUS | Status: AC
  Administered 2024-01-19: 1000 mg via INTRAVENOUS
  Filled 2024-01-18: qty 200

## 2024-01-18 MED ORDER — FREE WATER
200.0000 mL | Freq: Four times a day (QID) | Status: AC
  Administered 2024-01-18: 200 mL

## 2024-01-18 MED ORDER — GALANTAMINE HYDROBROMIDE 4 MG PO TABS
4.0000 mg | ORAL_TABLET | Freq: Every day | ORAL | Status: DC
  Administered 2024-01-19 – 2024-01-29 (×10): 4 mg
  Filled 2024-01-18 (×13): qty 1

## 2024-01-18 MED ORDER — METHYLPHENIDATE HCL 5 MG PO TABS
10.0000 mg | ORAL_TABLET | Freq: Two times a day (BID) | ORAL | Status: DC
  Administered 2024-01-18 – 2024-01-19 (×3): 10 mg
  Filled 2024-01-18 (×3): qty 2

## 2024-01-18 MED ORDER — JEVITY 1.5 CAL/FIBER PO LIQD
1000.0000 mL | ORAL | Status: AC
  Administered 2024-01-18: 1000 mL
  Filled 2024-01-18: qty 1000

## 2024-01-18 NOTE — Progress Notes (Signed)
 PROGRESS NOTE   Subjective/Complaints:  Patient denies any significant pain this morning.  Denies chest pain or shortness of breath.  No additional concerns elicited.  LBM today.  Patient scheduled for his PEG tomorrow.   ROS- limited by cognition   Objective:   No results found.  Recent Labs    01/18/24 0616  WBC 5.6  HGB 13.9  HCT 42.1  PLT 213     Recent Labs    01/18/24 0616  NA 139  K 4.0  CL 103  CO2 26  GLUCOSE 138*  BUN 32*  CREATININE 0.83  CALCIUM  9.3      Intake/Output Summary (Last 24 hours) at 01/18/2024 1723 Last data filed at 01/18/2024 1613 Gross per 24 hour  Intake 3573 ml  Output 2300 ml  Net 1273 ml        Physical Exam: Vital Signs Blood pressure (!) 145/96, pulse 81, temperature 97.6 F (36.4 C), resp. rate 18, height 5' 11 (1.803 m), weight 55 kg, SpO2 98%.  General: No acute distress, laying in bed, awakens to voice ENT- cortrak in nares  Masked facies  Mood and affect flat, opens eyes on command, minimally interactive Heart: Regular rate and rhythm no rubs murmurs or extra sounds Lungs: Clear to auscultation bilaterally though poor inspiratory effort, nonlabored breathing Abdomen: Positive bowel sounds, soft nontender to palpation, nondistended Extremities: No clubbing, cyanosis, or edema Skin: No evidence of breakdown, no evidence of rash over exposed surfaces Neuro: eyes open and follows very simple command (to open eyes), nods/shakes head more today, but still minimally interactive.   Neurologic: masked facies, eyes closed , no spontaneous movement . motor strength is 4/5 in right deltoid, bicep, tricep, grip, 2-/5 LUE and LLE- MMT varies based on LOA - lost focus during LE MMT with limited attempt despite cuing  Sensory exam limited by attention and concentration  Cerebellar exam could not cooperate  Musculoskeletal: no pain with hip or knee ROM BLE  Prior neuro  assessment is c/w today's exam 01/18/2024.    Assessment/Plan: 1. Functional deficits which require 3+ hours per day of interdisciplinary therapy in a comprehensive inpatient rehab setting. Physiatrist is providing close team supervision and 24 hour management of active medical problems listed below. Physiatrist and rehab team continue to assess barriers to discharge/monitor patient progress toward functional and medical goals  Care Tool:  Bathing    Body parts bathed by patient: Face   Body parts bathed by helper: Left arm, Abdomen, Chest, Right arm, Right upper leg, Left upper leg     Bathing assist Assist Level: 2 Helpers     Upper Body Dressing/Undressing Upper body dressing   What is the patient wearing?: Pull over shirt    Upper body assist Assist Level: 2 Helpers    Lower Body Dressing/Undressing Lower body dressing      What is the patient wearing?: Underwear/pull up, Pants     Lower body assist Assist for lower body dressing: 2 Helpers     Toileting Toileting    Toileting assist Assist for toileting: Dependent - Patient 0%     Transfers Chair/bed transfer  Transfers assist  Chair/bed transfer activity did  not occur: Safety/medical concerns  Chair/bed transfer assist level: Total Assistance - Patient < 25%     Locomotion Ambulation   Ambulation assist   Ambulation activity did not occur: Safety/medical concerns  Assist level: 2 helpers Assistive device: Other (comment) (3 musketeer support) Max distance: 6 ft   Walk 10 feet activity   Assist  Walk 10 feet activity did not occur: Safety/medical concerns        Walk 50 feet activity   Assist Walk 50 feet with 2 turns activity did not occur: Safety/medical concerns         Walk 150 feet activity   Assist Walk 150 feet activity did not occur: Safety/medical concerns         Walk 10 feet on uneven surface  activity   Assist Walk 10 feet on uneven surfaces activity did  not occur: Safety/medical concerns         Wheelchair     Assist Is the patient using a wheelchair?: Yes Type of Wheelchair: Manual Wheelchair activity did not occur: Safety/medical concerns  Wheelchair assist level: Dependent - Patient 0%      Wheelchair 50 feet with 2 turns activity    Assist    Wheelchair 50 feet with 2 turns activity did not occur: Safety/medical concerns   Assist Level: Dependent - Patient 0%   Wheelchair 150 feet activity     Assist  Wheelchair 150 feet activity did not occur: Safety/medical concerns   Assist Level: Dependent - Patient 0%   Blood pressure (!) 145/96, pulse 81, temperature 97.6 F (36.4 C), resp. rate 18, height 5' 11 (1.803 m), weight 55 kg, SpO2 98%.  Medical Problem List and Plan: 1. 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  Antithrombotics: -DVT/anticoagulation:  Pharmaceutical: Lovenox  40mg  daily             -antiplatelet therapy: ASA/Plavix  X 3 weeks followed by Plavix  alone Plavix  and aspirin  held for PEG-- lovenox  would need stopping too? WEEKDAY TEAM TO HOLD ONCE PEG SCHEDULED-looks like IR adjusted timing of this for tomorrow 3. Pain Management: Tylenol  prn.  4. Mood/Behavior/Sleep: LCSW to follow for evaluation and support.             --hx of sundowning-->continue melatonin 3 mg/Monitor sleep hygeine             -antipsychotic agents: N/A -Reduced level of alertness trial amantadine - no significant changes will trial methylphenidate  10mg  BID starting noon 8/11 -8/15 patient reported to be a little more alert working with OT this morning.  Continue current regimen 5. Neuropsych/cognition: This  patient is not capable of making decisions  6. Skin/Wound Care:  Routine pressure relief measures.  7. Fluids/Electrolytes/Nutrition: Continue NPO due to holding of orals.              --on tube feeds.  Appreciate dietitian assistance. 8. HTN: Monitor BP TID--goal 150 to avoid symptoms (multiple ED visits this year)             --hx of chronic dizziness. Needs to stay hydrated.  -8/14-18 BP controlled continue to monitor Vitals:  01/15/24 0400 01/15/24 0918 01/15/24 1439 01/15/24 1911  BP: 111/71 135/87 129/88 107/62   01/16/24 0454 01/16/24 1552 01/16/24 2001 01/17/24 0454  BP: 118/86 102/65 113/69 126/88   01/17/24 1352 01/17/24 2013 01/18/24 0406 01/18/24 1404  BP: 103/75 (!) 141/82 134/78 (!) 145/96    9. H/o SVT/CHF s/p PPM/syncope: Monitor for orthostatic symptoms   10. BPH/urinary retention: Urecholine  and Proscar  cannot be crushed per pharmacy so will hold (cannot use Avodart  capsule due to TF).  -01/09/24 PVR documented once, 66; monitor; of note, getting urocholine 10mg  TID... -01/10/24 low PVRs but not many documented -01/17/24 poor documentation of PVRs, but using condom cath and output looks ok. Monitor.   -8/18 bladder scan [id number], has not required recent IC  11. 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 Glaucoma: Resume home gtts Alphagan , Trusopt , Xalatan . 13. Dysphagia: NPO with tube feeds. May need PEG for nutritional support. BUN up will increase free H20  - Patient has IR consult for PEG, CT scan completed  - PEG for tomorrow scheduled.  Tube feeds and free water  held at midnight. 14. Acute renal failure: Improved from 24/1.36 to 27/0.99>> 30/0.89  - 8/14 improved at creatinine 0.79/BUN 30  -8/18 BUN/creatinine stable at 32/0.83 15. Chronic Thrombocytopenia: stable/improving      Latest Ref Rng & Units 01/18/2024    6:16 AM 01/14/2024    5:03 AM 01/11/2024    4:57 AM  CBC  WBC  4.0 - 10.5 K/uL 5.6  7.0  5.6   Hemoglobin 13.0 - 17.0 g/dL 86.0  84.4  84.7   Hematocrit 39.0 - 52.0 % 42.1  47.6  45.2   Platelets 150 - 400 K/uL 213  179  138      16.  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  UA + WBC but only rare bact, treat empirically keflex  500mg  BID x 3 d to see if this improves MS -No change in cognition noted  Urine blood tinged yesterday , monitor Hgb ok  Hgb is normal , mildly low plt also on asa , plavix  and enoxaparin , will monitor  May need to hold enoxaparin   8/15 urine recorded is yellow/straw, continue to monitor  18. Hx of gout: allopurinol  100mg  daily   LOS: 14 days A FACE TO FACE EVALUATION WAS PERFORMED  Murray Collier 01/18/2024, 5:23 PM

## 2024-01-18 NOTE — Plan of Care (Signed)
  Problem: Consults Goal: RH STROKE PATIENT EDUCATION Description: See Patient Education module for education specifics  Outcome: Progressing   Problem: RH BOWEL ELIMINATION Goal: RH STG MANAGE BOWEL WITH ASSISTANCE Description: STG Manage Bowel with toileting min Assistance. Outcome: Progressing Goal: RH STG MANAGE BOWEL W/MEDICATION W/ASSISTANCE Description: STG Manage Bowel with Medication with mod I Assistance. Outcome: Progressing   Problem: RH BLADDER ELIMINATION Goal: RH STG MANAGE BLADDER WITH ASSISTANCE Description: STG Manage Bladder With min Assistance Outcome: Progressing Goal: RH STG MANAGE BLADDER WITH MEDICATION WITH ASSISTANCE Description: STG Manage Bladder With Medication With mod I Assistance. Outcome: Progressing   Problem: RH SAFETY Goal: RH STG ADHERE TO SAFETY PRECAUTIONS W/ASSISTANCE/DEVICE Description: STG Adhere to Safety Precautions With cues Assistance/Device. Outcome: Progressing   Problem: RH COGNITION-NURSING Goal: RH STG USES MEMORY AIDS/STRATEGIES W/ASSIST TO PROBLEM SOLVE Description: STG Uses Memory Aids/Strategies With min Assistance to Problem Solve. Outcome: Progressing   Problem: RH PAIN MANAGEMENT Goal: RH STG PAIN MANAGED AT OR BELOW [name] PAIN GOAL Description: Pain < 4 with prns Outcome: Progressing   Problem: RH KNOWLEDGE DEFICIT Goal: RH STG INCREASE KNOWLEDGE OF HYPERTENSION Description: Patient and wife will be able to manage HTN using educational resources for medications and dietary modification independently Outcome: Progressing Goal: RH STG INCREASE KNOWLEDGE OF DYSPHAGIA/FLUID INTAKE Description: Patient and wife will be able to manage dysphagia using educational resources for medications and dietary modification independentl Outcome: Progressing Goal: RH STG INCREASE KNOWLEDGE OF STROKE PROPHYLAXIS Description: Patient and wife will be able to manage secondary risks using educational resources for medications and dietary  modification independentl Outcome: Progressing

## 2024-01-18 NOTE — Consult Note (Signed)
 Chief Complaint: Patient was seen in consultation today for Dysphagia--- for percutaneous gastric tube placement at the request of Dr Carilyn   Supervising Physician: Luverne Aran  Patient Status: [facility] - In-pt  History of Present Illness: Dustin Hamilton is a 83 y.o. 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   IMO SNOMED Dx Update Oct 2024     Cerebrovascular disease    Clostridium difficile diarrhea    Congenital anomaly of diaphragm    COVID-19 08/2023   Elevated PSA    Glaucoma, both eyes    Hemorrhoid    Hepatitis B surface antigen positive    02-20-2011   History of adenomatous polyp of colon    2007, 2009 and 2013  tubular adenoma's   History of alcohol  abuse    quit 1963   History of cerebral parenchymal hemorrhage    01/ 2006  left occiptial lobe related to hypertensive crisis   History of CVA (cerebrovascular accident)    09-12-2012  left hippocampus/ amygdala junction and per MRI old white matter infarcts--  per pt residual short- term memory issues   History of fatty infiltration of liver hx visit's at 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    Urethral lesion    urethral mass   UTI (urinary tract  infection) 03/15/2023    Past Surgical History:  Procedure Laterality Date   CARDIOVASCULAR STRESS TEST  05/05/2007   normal nuclear study w/ no ischemia/  normal LV fucntion and wall motion , ef60%   COLONOSCOPY  last one 04-06-2012   CYSTO/  LEFT RETROGRADE PYELOGRAM/ CYTOLOGY WASHINGS/  URETEROSCOPY  03/05/2000   INGUINAL HERNIA REPAIR Bilateral 1965 and 1980's   IR 3D INDEPENDENT WKST  07/31/2022   IR ANGIOGRAM PELVIS SELECTIVE OR SUPRASELECTIVE  07/31/2022   IR ANGIOGRAM SELECTIVE EACH ADDITIONAL VESSEL  07/31/2022   IR ANGIOGRAM SELECTIVE EACH ADDITIONAL VESSEL  07/31/2022   IR ANGIOGRAM SELECTIVE EACH ADDITIONAL VESSEL  07/31/2022   IR ANGIOGRAM SELECTIVE EACH ADDITIONAL VESSEL  07/31/2022   IR EMBO TUMOR ORGAN ISCHEMIA INFARCT INC GUIDE ROADMAPPING  07/31/2022   IR RADIOLOGIST EVAL & MGMT  06/26/2022   IR RADIOLOGIST EVAL & MGMT  08/08/2022   IR RADIOLOGIST EVAL & MGMT  08/21/2022   IR RADIOLOGIST EVAL & MGMT  03/25/2023   IR US  GUIDE VASC ACCESS RIGHT  07/31/2022   LAPAROSCOPIC INGUINAL HERNIA WITH UMBILICAL HERNIA Right 06/24/2007   LIVER BIOPSY  1980's and 2004   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   Procedure: PACEMAKER IMPLANT;  Surgeon: Waddell Danelle ORN, MD;  Location: [facility] INVASIVE CV LAB;  Service: Cardiovascular;  Laterality: N/A;   SVT ABLATION N/A 11/15/2018   Procedure: SVT ABLATION;  Surgeon: Waddell Danelle ORN, MD;  Location: [facility] INVASIVE CV LAB;  Service: Cardiovascular;  Laterality: N/A;   TRANSTHORACIC ECHOCARDIOGRAM  09/13/2012   moderate LVH,  ef 60-65%/     TRANSURETHRAL RESECTION OF BLADDER TUMOR N/A 08/11/2016   Procedure: TRANSURETHRAL RESECTION OF BLADDER TUMOR (TURBT);  Surgeon: Belvie LITTIE Clara, MD;  Location: [facility];  Service: Urology;  Laterality: N/A;    Allergies: Penicillins, Levetiracetam , Aricept  [donepezil ], Exelon [rivastigmine], and Viagra [sildenafil]  Medications: Prior to Admission medications   Medication Sig Start  Date End Date Taking? Authorizing Provider  acetaminophen  (TYLENOL ) 325 MG tablet Take 650 mg by mouth 3 (three) times daily as needed for moderate pain (pain score 4-6). 12/01/23   [provider]  allopurinol  (ZYLOPRIM ) 100 MG tablet Place 1 tablet (100 mg total) into feeding tube daily. 01/04/24 01/03/25  Gherghe, Costin M, MD  amLODipine  (NORVASC ) 5 MG tablet Place 1 tablet (5 mg total) into feeding tube daily. 01/04/24   Gherghe, Costin M, MD  aspirin  81 MG chewable tablet Place 1 tablet (81 mg total) into feeding tube daily for 16 days. 01/05/24 01/21/24  Gherghe, Costin M, MD  bethanechol  (URECHOLINE ) 10 MG tablet Place 1 tablet (10 mg total) into feeding tube 3 (three) times daily. 01/04/24   Gherghe, Costin M, MD  brimonidine  (ALPHAGAN ) 0.2 % ophthalmic solution Place 1 drop into both eyes 2 (two) times daily. 10/19/22   Sridharan, Sriramkumar, MD  clopidogrel  (PLAVIX ) 75 MG tablet Place 1 tablet (75 mg total) into feeding tube daily. 01/05/24   Gherghe, Costin M, MD  colchicine  0.6 MG tablet Place 1 tablet (0.6 mg total) into feeding tube daily as needed. 01/04/24   Gherghe, Costin M, MD  dorzolamide  (TRUSOPT ) 2 % ophthalmic solution Place 1 drop into both eyes 2 (two) times daily.    [provider]  galantamine  (RAZADYNE ) 8 MG tablet Take 8 mg by mouth daily. Takes along with memantine     [provider]  latanoprost  (XALATAN ) 0.005 % ophthalmic solution Place 1 drop into both eyes at bedtime.    [provider]  melatonin 3 MG TABS tablet Take 3 mg by mouth at bedtime.    [provider]  memantine  (NAMENDA ) 10 MG tablet Take 10 mg by mouth 2 (two) times daily.    [provider]  tamsulosin  (FLOMAX ) 0.4 MG CAPS capsule Take 0.4 mg by mouth daily.    [provider]     Family History  Problem Relation Age of Onset   Rheum arthritis Mother    Diabetes Mother    Stroke Mother    Heart attack Mother    Kidney failure Mother    Heart  attack Father    Heart disease Maternal Grandmother    Rheum arthritis Maternal Grandmother    Diabetes Maternal Grandmother    Stroke Maternal Grandmother    Colon cancer Neg Hx     Social History   Socioeconomic History   Marital status: Married    Spouse name: Apolinar   Number of children: 1   Years of education: College   Highest education level: Not on file  Occupational History   Occupation: Environmental manager  Tobacco Use   Smoking status: Former    Current packs/day: 0.00    Average packs/day: 1 pack/day for 5.0 years (5.0 ttl pk-yrs)    Types: Cigarettes    Start date: 11/30/1976  Quit date: 11/30/1981    Years since quitting: 42.1   Smokeless tobacco: Never  Vaping Use   Vaping status: Never Used  Substance and Sexual Activity   Alcohol  use: No    Alcohol /week: 0.0 standard drinks of alcohol     Comment: hx abuse -- quit:  1963   Drug use: No    Comment: 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   Caffeine Use:  Tea, lots   Social Drivers of Health   Financial Resource Strain: Not on file  Food Insecurity: Patient Unable To Answer (12/31/2023)   Hunger Vital Sign    Worried About Running Out of Food in the Last Year: Patient unable to answer    Ran Out of Food in the Last Year: Patient unable to answer  Transportation Needs: Patient Unable To Answer (12/31/2023)   PRAPARE - Transportation    Lack of Transportation (Medical): Patient unable to answer    Lack of Transportation (Non-Medical): Patient unable to answer  Physical Activity: Not on file  Stress: Not on file  Social Connections: Patient Unable To Answer (12/31/2023)   Social Connection and Isolation Panel    Frequency of Communication with Friends and Family: Patient unable to answer    Frequency of Social Gatherings with Friends and Family: Patient unable to answer     Attends Religious Services: Patient unable to answer    Active Member of Clubs or Organizations: Patient unable to answer    Attends Banker Meetings: Patient unable to answer    Marital Status: Patient unable to answer    Review of Systems: A 12 point ROS discussed and pertinent positives are indicated in the HPI above.  All other systems are negative.    Vital Signs: BP 134/78 (BP Location: Right Arm)   Pulse 74   Temp 97.7 F (36.5 C) (Oral)   Resp 16   Ht 5' 11 (1.803 m)   Wt 138 lb 14.2 oz (63 kg)   SpO2 97%   BMI 19.37 kg/m   Advance Care Plan: The advanced care plan/surrogate decision maker was discussed at the time of visit and documented in the medical record.    Physical Exam Vitals reviewed.  Constitutional:      Appearance: He is ill-appearing.     Comments: Thin frail No response  HENT:     Mouth/Throat:     Mouth: Mucous membranes are moist.  Cardiovascular:     Rate and Rhythm: Normal rate.     Heart sounds: No murmur heard. Pulmonary:     Effort: Pulmonary effort is normal.     Breath sounds: Wheezing present.  Abdominal:     Palpations: Abdomen is soft.  Skin:    General: Skin is warm.  Neurological:     Comments: No response  Psychiatric:     Comments: Spoke to wife Apolinar via phone for consent     Imaging: CT ABDOMEN WO CONTRAST Result Date: 01/14/2024 CLINICAL DATA:  Cerebral infarction and evaluation for possible percutaneous gastrostomy tube placement for nutritional needs. EXAM: CT ABDOMEN WITHOUT CONTRAST TECHNIQUE: Multidetector CT imaging of the abdomen was performed following the standard protocol without IV contrast. RADIATION DOSE REDUCTION: This exam was performed according to the departmental dose-optimization program which includes automated exposure control, adjustment of the mA and/or kV  according to patient size and/or use of iterative reconstruction technique. COMPARISON:  Prior CT of the abdomen and pelvis on  04/24/2023 FINDINGS: Lower chest: Right basilar atelectasis. Hepatobiliary: No focal liver abnormality is seen. No gallstones, gallbladder wall thickening, or biliary dilatation. Pancreas: Unremarkable. No pancreatic ductal dilatation or surrounding inflammatory changes. Spleen: No splenic injury or perisplenic hematoma. Adrenals/Urinary Tract: Adrenal glands are unremarkable. No renal calculi or hydronephrosis. Stable 1.7 cm right hyperdense renal cyst consistent with a hemorrhagic cyst. Stable Bosniak 1 cysts of the left kidney and small hyperdense 7 mm hemorrhagic cyst of the left kidney. Stomach/Bowel: No hiatal hernia. A feeding tube enters the stomach and terminates at the level of the duodenal bulb. The stomach is normally positioned. No anatomic contraindication to attempted percutaneous gastrostomy. No bowel obstruction or significant ileus identified. No evidence of free intraperitoneal air. Visualized bowel demonstrates no visible lesion or inflammatory process. Vascular/Lymphatic: Minimal atherosclerosis of the abdominal aorta. No aneurysm identified. No lymphadenopathy. Other: No abdominal wall hernia identified. No ascites or abnormal fluid collections. Musculoskeletal: No bony abnormalities identified. Degenerative disc disease of the visualized spine. No bony lesions. No body wall anasarca. IMPRESSION: 1. No anatomic contraindication to attempted percutaneous gastrostomy. 2. Stable bilateral renal cysts, including bilateral hemorrhagic cysts. 3. Right basilar atelectasis. 4. Aortic atherosclerosis. Electronically Signed   By: Marcey Moan M.D.   On: 01/14/2024 12:51   CT HEAD WO CONTRAST ( ) Result Date: 01/02/2024 CLINICAL DATA:  83 year old male with change in mental status. Delirium. Status post code stroke presentation on 12/30/2023, with MRI revealing right basal ganglia infarct. EXAM: CT HEAD WITHOUT CONTRAST TECHNIQUE: Contiguous axial images were obtained from the base of the skull  through the vertex without intravenous contrast. RADIATION DOSE REDUCTION: This exam was performed according to the departmental dose-optimization program which includes automated exposure control, adjustment of the mA and/or kV according to patient size and/or use of iterative reconstruction technique. COMPARISON:  Brain MRI, head CT, CTA 12/30/2023. FINDINGS: Brain: Cytotoxic edema now evident throughout the right basal ganglia (series 3, image 19). No hemorrhagic transformation. Other chronically advanced cerebral white matter hypodensity, areas of cortical encephalomalacia in the left parietal and occipital lobes appears stable. No midline shift, mass effect, or evidence of intracranial mass lesion. Stable ventricle size and configuration. No acute intracranial hemorrhage identified. Vascular: Calcified atherosclerosis at the skull base. No suspicious intracranial vascular hyperdensity. Skull: Stable and intact. Sinuses/Orbits: Left nasoenteric tube now in place. Visualized paranasal sinuses and mastoids are stable and well aerated. Other: Stable orbit and scalp soft tissues. IMPRESSION: 1. Expected evolution of the Right Basal Ganglia cytotoxic edema since 12/30/2023. No hemorrhagic transformation or intracranial mass effect. 2. Underlying advanced chronic ischemic disease. No new intracranial abnormality. Electronically Signed   By: VEAR Hurst M.D.   On: 01/02/2024 06:30   DG CHEST PORT 1 VIEW Result Date: 01/02/2024 EXAM: 1 VIEW XRAY OF THE CHEST 01/02/2024 05:17:00 AM COMPARISON: 11/10/2023 CLINICAL HISTORY: Hypotension. Respiratory distress. FINDINGS: LUNGS AND PLEURA: Mild subsegmental atelectasis in the right base. No focal pulmonary opacity. No pulmonary edema. No pleural effusion. No pneumothorax. HEART AND MEDIASTINUM: There is a left chest wall pacer device with lead in the right atrial appendage and right ventricle. No acute abnormality of the cardiac and mediastinal silhouettes. BONES AND SOFT  TISSUES: Unchanged Asymmetric elevation of the right hemidiaphragm. No acute osseous abnormality. LINES AND TUBES: Feeding tube drip projects over the anterolateral pyloric junction within the right upper quadrant of the abdomen. IMPRESSION: 1. Mild  subsegmental atelectasis in the right base. Electronically signed by: Waddell Calk MD 01/02/2024 05:55 AM EDT RP Workstation: [id number]   DG Abd Portable 1V Result Date: 01/01/2024 CLINICAL DATA:  Enteric tube placement EXAM: PORTABLE ABDOMEN - 1 VIEW COMPARISON:  CT abdomen and pelvis dated 04/24/2023 FINDINGS: Gastric/enteric tube tip projects over the distal stomach/proximal duodenum. IMPRESSION: Gastric/enteric tube tip projects over the distal stomach/proximal duodenum. Electronically Signed   By: Limin  Xu M.D.   On: 01/01/2024 15:24   ECHOCARDIOGRAM COMPLETE Result Date: 12/31/2023    ECHOCARDIOGRAM REPORT   Patient Name:   MAKSYM PFIFFNER Date of Exam: 12/31/2023 Medical Rec #:  [id number]          Height:       71.0 in Accession #:    [id number]         Weight:       147.7 lb Date of Birth:  Oct 05, 1940          BSA:          1.854 m Patient Age:    83 years           BP:           138/86 mmHg Patient Gender: M                  HR:           60 bpm. Exam Location:  Inpatient Procedure: 2D Echo, Cardiac Doppler and Color Doppler (Both Spectral and Color            Flow Doppler were utilized during procedure). Indications:    Stroke  History:        Patient has prior history of Echocardiogram examinations, most                 recent 08/07/2023. Risk Factors:Hypertension.  Sonographer:    Philomena Daring Referring Phys: [id number] MARSA NOVAK MELVIN  Sonographer Comments: Technically challenging study due to limited acoustic windows, Technically difficult study due to poor echo windows, suboptimal apical window and no subcostal window. Image acquisition challenging due to patient body habitus. IMPRESSIONS  1. Left ventricular ejection fraction, by estimation, is 55  to 60%. The left ventricle has normal function. Left ventricular endocardial border not optimally defined to evaluate regional wall motion. Left ventricular diastolic function could not be evaluated.  2. Right ventricular systolic function is normal. The right ventricular size is normal.  3. The mitral valve was not well visualized. No evidence of mitral valve regurgitation. No evidence of mitral stenosis.  4. The aortic valve is tricuspid. There is moderate calcification of the aortic valve. Aortic valve regurgitation is not visualized. Aortic valve sclerosis/calcification is present, without any evidence of aortic stenosis. Conclusion(s)/Recommendation(s): Very limited echo due to pood sound wave transmission. LV never really seen well but in one image we have EF seems ok. FINDINGS  Left Ventricle: Left ventricular ejection fraction, by estimation, is 55 to 60%. The left ventricle has normal function. Left ventricular endocardial border not optimally defined to evaluate regional wall motion. The left ventricular internal cavity size was normal in size. There is no left ventricular hypertrophy. Left ventricular diastolic function could not be evaluated. Right Ventricle: The right ventricular size is normal. No increase in right ventricular wall thickness. Right ventricular systolic function is normal. Left Atrium: Left atrial size was not well visualized. Right Atrium: Right atrial size was not well visualized. Pericardium: There is no evidence of pericardial effusion. Mitral Valve: The  mitral valve was not well visualized. No evidence of mitral valve regurgitation. No evidence of mitral valve stenosis. Tricuspid Valve: The tricuspid valve is normal in structure. Tricuspid valve regurgitation is trivial. No evidence of tricuspid stenosis. Aortic Valve: The aortic valve is tricuspid. There is moderate calcification of the aortic valve. Aortic valve regurgitation is not visualized. Aortic valve  sclerosis/calcification is present, without any evidence of aortic stenosis. Pulmonic Valve: The pulmonic valve was not assessed. Pulmonic valve regurgitation is not visualized. No evidence of pulmonic stenosis. Aorta: The aortic root is normal in size and structure. Venous: The inferior vena cava was not well visualized. IAS/Shunts: No atrial level shunt detected by color flow Doppler.  LEFT VENTRICLE PLAX 2D LVOT diam:     2.06 cm LVOT Area:     3.33 cm   AORTA Ao Root diam: 3.03 cm  SHUNTS Systemic Diam: 2.06 cm Toribio Fuel MD Electronically signed by Toribio Fuel MD Signature Date/Time: 12/31/2023/10:08:09 AM    Final    MR BRAIN WO CONTRAST Result Date: 12/30/2023 EXAM: MRI BRAIN WITHOUT CONTRAST 12/30/2023 05:45:53 PM TECHNIQUE: Multiplanar multisequence MRI of the head/brain was performed without the administration of intravenous contrast. COMPARISON: CT head and CTA head and neck dated 12/30/2023. MRI head dated 12/30/2023. CLINICAL HISTORY: Neuro deficit, acute, stroke suspected. Stroke rule out. FINDINGS: BRAIN AND VENTRICLES: Remote left PCA (posterior cerebral artery) territory infarcts. There are nonspecific hyperintense foci in the subcortical and periventricular white matter that most likely represent chronic microangiopathic ischemic changes in a patient of this age. Moderate generalized parenchymal volume loss. Small remote infarct in the left centrum semiovale. There are numerous scattered chronic micro hemorrhages in the cerebral hemispheres particularly within the temporal and occipital lobes with additional chronic micro hemorrhages in the left dorsal midbrain as well as within the bilateral cerebellum. There is restricted diffusion within the right basal ganglia primarily involving the superior aspect of the lentiform nucleus as well as the right caudate head extending into the body of the right caudate nucleus. Additional small remote infarcts in the right occipital cortex within  the right PCA territory. ORBITS: No acute abnormality. SINUSES AND MASTOIDS: Bilateral mastoid effusions. BONES AND SOFT TISSUES: Normal marrow signal. No acute soft tissue abnormality. IMPRESSION: 1. Acute infarct in the right basal ganglia. 2. Remote left PCA territory infarcts and additional small remote infarcts in the right occipital cortex within the right PCA territory. 3. 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 4. Moderate generalized parenchymal volume loss. Electronically signed by: Donnice Mania MD 12/30/2023 06:56 PM EDT RP Workstation: [id number]   CT ANGIO HEAD NECK W WO CM Result Date: 12/30/2023 EXAM: CTA HEAD AND NECK WITH AND WITHOUT 12/30/2023 04:41:29 PM TECHNIQUE: CTA of the head and neck was performed with and without the administration of intravenous contrast. Multiplanar 2D and/or 3D reformatted images are provided for review. Automated exposure control, iterative reconstruction, and/or weight based adjustment of the mA/kV was utilized to reduce the radiation dose to as low as reasonably achievable. Stenosis of the internal carotid arteries measured using NASCET criteria. COMPARISON: 12/15/2022 CLINICAL HISTORY: Neuro deficit, acute, stroke suspected. FINDINGS: CTA NECK: AORTIC ARCH AND ARCH VESSELS: No dissection or arterial injury. No significant stenosis of the brachiocephalic or subclavian arteries. CERVICAL CAROTID ARTERIES: Mild atherosclerosis at the right carotid bifurcation without hemodynamically significant stenosis. Additional mild atherosclerosis at the left carotid bifurcation without hemodynamically significant stenosis. CERVICAL VERTEBRAL ARTERIES: Tortuosity of the right V1 segment. Also tortuosity of the  left V1 segment and the bilateral V2 segments. Atherosclerosis of the bilateral V4 segments resulting in focal moderate stenosis of the V4 segment right vertebral artery. LUNGS AND MEDIASTINUM: Unremarkable. SOFT  TISSUES: Multiple subcentimeter thyroid  nodules. No cervical lymphadenopathy. BONES: No acute or aggressive finding of the osseous structures. 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 CTA HEAD: ANTERIOR CIRCULATION: No significant stenosis of the internal carotid arteries. The ACAs are patent bilaterally. There is focal moderate stenosis of the distal right M1 segment which is new from prior. The MCAs are otherwise patent and normal in caliber. POSTERIOR CIRCULATION: Redemonstrated occlusion of the proximal P2 segment of the left PCA with reconstitution at the mid P2 segment. The PCAs are otherwise patent bilaterally. No significant stenosis of the basilar artery. No aneurysm. OTHER: No dural venous sinus thrombosis on this non-dedicated study. Mild atherosclerosis of the carotid siphons without stenosis. IMPRESSION: 1. New focal moderate stenosis of the distal M1 segment of the right MCA. 2. Redemonstrated occlusion of the proximal P2 segment of the left PCA with reconstitution at the mid P2 segment. 3. Focal moderate stenosis of the V4 segment right vertebral artery. 4. Mild atherosclerosis at the right and left carotid bifurcations without hemodynamically significant stenosis. Electronically signed by: Donnice Mania MD 12/30/2023 05:03 PM EDT RP Workstation: [id number]   CT HEAD CODE STROKE WO CONTRAST` Result Date: 12/30/2023 CLINICAL DATA:  Code stroke. 83 year old male with weakness and leftward gaze. EXAM: CT HEAD WITHOUT CONTRAST TECHNIQUE: Contiguous axial images were obtained from the base of the skull through the vertex without intravenous contrast. RADIATION DOSE REDUCTION: This exam was performed according to the departmental dose-optimization program which includes automated exposure control, adjustment of the mA and/or kV according to patient size and/or use of iterative  reconstruction technique. COMPARISON:  Brain MRI 03/17/2023.  Head CT 10/20/2023. FINDINGS: Brain: Stable cerebral volume. Chronic encephalomalacia left PCA territory. Chronic nonspecific ventriculomegaly. Chronic bilateral cerebral white matter hypodensity which is confluent. Chronic heterogeneity in the deep gray nuclei. Stable gray-white matter differentiation throughout the brain. No midline shift, mass effect, evidence of mass lesion, intracranial hemorrhage or evidence of cortically based acute infarction. Vascular: Advanced Calcified atherosclerosis at the skull base. No suspicious intracranial vascular hyperdensity. Skull: Stable and intact. Sinuses/Orbits: Visualized paranasal sinuses and mastoids are stable and well aerated. Other: No gaze deviation at this time. No acute scalp soft tissue findings. ASPECTS [facility] Stroke Program Early CT Score) Total score (0-10 with 10 being normal): 10 IMPRESSION: 1. No acute cortically based infarct or acute intracranial hemorrhage identified. ASPECTS 10. 2.  Stable non contrast CT appearance of chronic ischemic disease. 3. These results were communicated to Dr. Vanessa at 10:57 am on 12/30/2023 by text page via the [facility] messaging system. Electronically Signed   By: VEAR Hurst M.D.   On: 12/30/2023 10:58    Labs:  CBC: Recent Labs    01/07/24 0429 01/11/24 0457 01/14/24 0503 01/18/24 0616  WBC 8.3 5.6 7.0 5.6  HGB 15.4 15.2 15.5 13.9  HCT 46.4 45.2 47.6 42.1  PLT 143* 138* 179 213    COAGS: Recent Labs    03/14/23 1920 09/30/23 0020 12/30/23 1044  INR 1.1 1.1 1.2  APTT 25  --  31    BMP: Recent Labs    01/07/24 0844 01/11/24 0457 01/14/24 0503 01/18/24 0616  NA 140 140 140 139  K 4.7 4.4 4.4 4.0  CL 106 106  105 103  CO2 27 27 29 26   GLUCOSE 199* 178* 137* 138*  BUN 30* 39* 30* 32*  CALCIUM  9.9 9.2 9.7 9.3  CREATININE 0.89 1.01 0.79 0.83  GFRNONAA >60 >60 >60 >60    LIVER FUNCTION TESTS: Recent Labs    01/02/24 0258  01/02/24 0510 01/03/24 0329 01/05/24 0526  BILITOT 1.5* 1.4* 0.8 0.4  AST 33 34 30 78*  ALT 27 31 35 79*  ALKPHOS 61 63 55 57  PROT 6.8 7.1 6.2* 6.5  ALBUMIN 3.4* 3.5 3.0* 2.9*    TUMOR MARKERS: No results for input(s): AFPTM, CEA, CA199, CHROMGRNA in the last 8760 hours.  Assessment and Plan:  Scheduled for percutaneous gastric tube placement in IR 8/19 Risks and benefits image guided gastrostomy tube placement was discussed with the patient's family and wife Apolinar via phone including, but not limited to the need for a barium enema during the procedure, bleeding, infection, peritonitis and/or damage to adjacent structures.  All questions were answered, Wife Apolinar is agreeable to proceed.  Consent signed and in chart.  Thank you for this interesting consult.  I greatly enjoyed meeting JAWANZA ZAMBITO and look forward to participating in their care.  A copy of this report was sent to the requesting provider on this date.  Electronically Signed: Sharlet DELENA Candle, PA-C 01/18/2024, 12:40 PM   I spent a total of 20 Minutes    in face to face in clinical consultation, greater than 50% of which was counseling/coordinating care for percutaneous gastric tube placement

## 2024-01-18 NOTE — Discharge Summary (Signed)
 Physician Discharge Summary  Patient ID: Dustin Hamilton MRN: [id number] DOB/AGE: [birth year] 83 y.o.  Admit date: 01/04/2024 Discharge date: 01/29/2024  Discharge Diagnoses:  Principal Problem:   Stroke of right basal ganglia (HCC) Active Problems:   Essential hypertension   Thrombocytopenia (HCC)   Heart block   Dementia (HCC)   Mixed hyperlipidemia   Secondary parkinsonism, unspecified (HCC)   Status cardiac pacemaker   Protein-calorie malnutrition, severe   Dysphagia   Aphasia due to acute stroke (HCC)   Prerenal azotemia   Insomnia  Hematuria  UTI   Discharged Condition: Stable  Significant Diagnostic Studies: IR GASTROSTOMY TUBE MOD SED Result Date: 01/19/2024 INDICATION: 83 year old male with dysphagia and protein calorie malnutrition. He has a chronic indwelling enteric feeding tube and presents for placement of a more durable percutaneous gastrostomy tube. EXAM: Fluoroscopically guided placement of percutaneous pull-through gastrostomy tube Interventional Radiologist:  Wilkie LOIS Lent, MD MEDICATIONS: 1 mg glucagon  administered intravenously by the Radiology nurse ANESTHESIA/SEDATION: Versed  1 mg IV; Fentanyl  50 mcg IV administered by the radiology nurse Moderate Sedation Time:  10 minutes The patient's vital signs and level of consciousness were continuously monitored during the procedure by the interventional radiology nurse under my direct supervision. CONTRAST:  25mL OMNIPAQUE  IOHEXOL  300 MG/ML  SOLN FLUOROSCOPY: Radiation exposure index: 7 mGy reference air kerma COMPLICATIONS: None immediate. PROCEDURE: Informed written consent was obtained from the patient after a thorough discussion of the procedural risks, benefits and alternatives. All questions were addressed. Maximal Sterile Barrier Technique was utilized including caps, mask, sterile gowns, sterile gloves, sterile drape, hand hygiene and skin antiseptic. A timeout was performed prior to the initiation of the  procedure. Maximal barrier sterile technique utilized including caps, mask, sterile gowns, sterile gloves, large sterile drape, hand hygiene, and chlorhexadine skin prep. An angled catheter was advanced over a wire under fluoroscopic guidance through the nose, down the esophagus and into the body of the stomach. The stomach was then insufflated with several 100 ml of air. Fluoroscopy confirmed location of the gastric bubble, as well as inferior displacement of the barium stained colon. Under direct fluoroscopic guidance, a single T-tack was placed, and the anterior gastric wall drawn up against the anterior abdominal wall. Percutaneous access was then obtained into the mid gastric body with an 18 gauge sheath needle. Aspiration of air, and injection of contrast material under fluoroscopy confirmed needle placement. An Amplatz wire was advanced in the gastric body and the access needle exchanged for a 9-French vascular sheath. A snare device was advanced through the vascular sheath and an Amplatz wire advanced through the angled catheter. The Amplatz wire was successfully snared and this was pulled up through the esophagus and out the mouth. A 20-French Suzen Gaskins MIC-PEG tube was then connected to the snare and pulled through the mouth, down the esophagus, into the stomach and out to the anterior abdominal wall. Hand injection of contrast material confirmed intragastric location. The T-tack retention suture was then cut. The pull through peg tube was then secured with the external bumper and capped. The patient will be observed for several hours with the newly placed tube on low wall suction to evaluate for any post procedure complication. The patient tolerated the procedure well, there is no immediate complication. IMPRESSION: Successful placement of a 20 French pull through gastrostomy tube. Electronically Signed   By: Wilkie Lent M.D.   On: 01/19/2024 09:34   CT ABDOMEN WO CONTRAST Result Date:  01/14/2024 CLINICAL DATA:  Cerebral infarction and  evaluation for possible percutaneous gastrostomy tube placement for nutritional needs. EXAM: CT ABDOMEN WITHOUT CONTRAST TECHNIQUE: Multidetector CT imaging of the abdomen was performed following the standard protocol without IV contrast. RADIATION DOSE REDUCTION: This exam was performed according to the departmental dose-optimization program which includes automated exposure control, adjustment of the mA and/or kV according to patient size and/or use of iterative reconstruction technique. COMPARISON:  Prior CT of the abdomen and pelvis on 04/24/2023 FINDINGS: Lower chest: Right basilar atelectasis. Hepatobiliary: No focal liver abnormality is seen. No gallstones, gallbladder wall thickening, or biliary dilatation. Pancreas: Unremarkable. No pancreatic ductal dilatation or surrounding inflammatory changes. Spleen: No splenic injury or perisplenic hematoma. Adrenals/Urinary Tract: Adrenal glands are unremarkable. No renal calculi or hydronephrosis. Stable 1.7 cm right hyperdense renal cyst consistent with a hemorrhagic cyst. Stable Bosniak 1 cysts of the left kidney and small hyperdense 7 mm hemorrhagic cyst of the left kidney. Stomach/Bowel: No hiatal hernia. A feeding tube enters the stomach and terminates at the level of the duodenal bulb. The stomach is normally positioned. No anatomic contraindication to attempted percutaneous gastrostomy. No bowel obstruction or significant ileus identified. No evidence of free intraperitoneal air. Visualized bowel demonstrates no visible lesion or inflammatory process. Vascular/Lymphatic: Minimal atherosclerosis of the abdominal aorta. No aneurysm identified. No lymphadenopathy. Other: No abdominal wall hernia identified. No ascites or abnormal fluid collections. Musculoskeletal: No bony abnormalities identified. Degenerative disc disease of the visualized spine. No bony lesions. No body wall anasarca. IMPRESSION: 1. No  anatomic contraindication to attempted percutaneous gastrostomy. 2. Stable bilateral renal cysts, including bilateral hemorrhagic cysts. 3. Right basilar atelectasis. 4. Aortic atherosclerosis. Electronically Signed   By: Marcey Moan M.D.   On: 01/14/2024 12:51    Labs:  Basic Metabolic Panel:    Latest Ref Rng & Units 01/29/2024    4:56 AM 01/28/2024    5:39 AM 01/25/2024    5:16 AM  BMP  Glucose 70 - 99 mg/dL 99  97  89   BUN 8 - 23 mg/dL 25  35  36   Creatinine 0.61 - 1.24 mg/dL 9.16  9.15  8.84   Sodium 135 - 145 mmol/L 138  138  139   Potassium 3.5 - 5.1 mmol/L 4.5  4.6  4.3   Chloride 98 - 111 mmol/L 103  104  107   CO2 22 - 32 mmol/L 25  24  25    Calcium  8.9 - 10.3 mg/dL 9.6  9.6  9.7      CBC:    Latest Ref Rng & Units 01/29/2024    4:56 AM 01/28/2024    5:39 AM 01/25/2024    5:16 AM  CBC  WBC 4.0 - 10.5 K/uL 4.7  5.6  7.0   Hemoglobin 13.0 - 17.0 g/dL 86.1  86.3  85.0   Hematocrit 39.0 - 52.0 % 41.8  41.3  43.8   Platelets 150 - 400 K/uL 137  149  209      CBG: Recent Labs  Lab 01/28/24 1203 01/28/24 1640 01/28/24 2050 01/29/24 0617 01/29/24 1138  GLUCAP 156* 120* 115* 98 132*    Brief HPI:   Dustin Hamilton is a 83 y.o. right-handed male with history significant for CVA, ICH, SVT, CHB status post PPM, fatty liver, parkinsonian-ism, dementia with sundowning, cerebellar angiopathy with multiple amyloid spells (transient focal neurological deficits), orthostatic hypotension with syncope.  Presented to the ED 12/30/2023 with decrease in the level of consciousness left-sided weakness right gaze deviation and left facial droop.  CTA head/neck showed new focal moderate stenosis of distal M1 segment of right MCA, old P2 segmental occlusion of left PCA and focal moderate stenosis of V4 segment of right-VA.  MRI of the brain revealed acute infarct in right basal ganglia with remote left PCA territory infarct, numerous chronic microhemorrhages consistent with history of  cerebellar amyloid angiopathy and moderate generalized parenchymal volume loss.  Echocardiogram ejection fraction of 55 to 65% with moderate calcification of R-AV and negative for shunt but study limited due to body habitus  Dr. Rosemarie recommended DAPT x 3 weeks followed by Plavix  alone.  Hospital course significant for pseudo bulbar affect with nonverbal state, increased tone in all extremities with drift to the left, ability to intermittently follow simple commands, tends to keep eyes closed and opens mouth reflexively but with poor oral awareness and significant oral holding.  He was made n.p.o. started on tube feeds for nutritional support.  He did have decrease in responsiveness with hypotension and early a.m. of 8/02 and improved with fluid bolus x 1 L.  Noted elevated lactate and had to be hydrated.  Chest x-ray with mild right base atelectasis.  CT of the head showed expected evolution without hemorrhagic transformation.  Therapy evaluations completed due to patient decreased functional mobility was admitted for a comprehensive rehab program.   Hospital Course: Dustin Hamilton was admitted to rehab 01/04/2024 for inpatient therapies to consist of PT, ST and OT at least three hours five days a week. Past admission physiatrist, therapy team and rehab RN have worked together to provide customized collaborative inpatient rehab.  Pertaining to patient right basal ganglia infarct remained stable.  ASA was discontinued after 3 weeks and he continues on Plavix  alone.  He continued to hae delay in processing with decreased energy levels and UA/UCS ordered which showed >100,000 colonies which was treated with 3 day course antibiotics as felt to be colonization and showed no improvement in mentation. Ritalin  was added and titrated up to 10 mg with improvement in attention and alertness. He is tolerating this without SE. 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   Patient was using  melatonin to help aid in sleep.    Dysphagia treatment has been ongoing without improvement on MBS and alternative method of nutritional support recommended. IR was consulted, CT A/P ordered which showed that he was candidate for percutaneous placement and PEG tube placed on 01/19/2024 by Dr. Karalee.  RD has been following for input on tube feeds/caloric needs and bolus tubes initiated. He is tolerating this without difficulty and as serial check of lytes showed continued pre-renal azotemia; water  flushes were increased to 300 mg qid. Blood pressures have been stable and no cardiac symptoms noted with increase in activity. Voiding was monitored briefly with bladder scans and no signs of retention noted. He continues to be incontinent of B/B. ers given for orthostatic vitals.  Serial check of CBC showed iH/H to be stable and chronic thrombocytopenia noted with platelets in 130-140 range.   PEG site is healing well but was noted to have some bleeding under flange due to hypergranulated tissue which was treated locally with silver nitrate by IR. He did developed hematuria  day prior to d/c and UA rechecked showing signs of pyuria  despite being  afebrile and WBC stable but due to concerns of UTI amoxicillin added empirically.  Case discussed with GU NP who felt that hematuria could be due to hemorrhagic cysts and recommended follow up CBC in  a week or so to monitor for stability.  Appointment set for evaluation by urology on 09/04. He has been making slow gains and SNF was discussed with wife who has hired caregivers to assist at home. He will continue to receive follow up HHPT, HHST, HHOT and HHRN by after discharge.     Rehab course: During patient's stay in rehab weekly team conferences were held to monitor patient's progress, set goals and discuss barriers to discharge. At admission, patient required total assist with ADL tasks and with mobility. Verbal output was limited with mod assist to verbalize name  and max assist to initate numbers. He  has had improvement in activity tolerance, balance, postural control as well as ability to compensate for deficits. He/She has had improvement in functional use RUE/LUE  and RLE/LLE as well as improvement in awareness.  Patient assisted rolling with total assist and verbal cues with increasing processing time.  Dependent to don pants.  Transferred supine to edge of bed with total assist.  Patient assisted with bringing right lower extremity off the bed with increasing processing time.  On edge of bed patient tends to lean posteriorly with minimal assist to maintain verbal cues.  Dependent to don shirt.  Transfer to edge of bed to wheelchair with squat pivot total assist, patient attempted to push through lower extremity to assist with transfer.  Attempts to stand in parallel bars with total assist.  On the mat treatment focused on NMR.  Patient transition from sitting upright to propped on right elbow x 3.  Patient able to maintain upright posture with supervision to contact-guard and propped on right elbow with supervision.  Patient demonstrates assistance with transition between upright and prop on elbow.  During ADLs patient presents with some extremity tone throughout his trunk relation to his shoulders neck and back region.  He was able to gesture that he felt better when repositioned.  Patient  is able to complete ADL tasks with max assist.  He requires total assist for bed mobility, max assist for transfer and is able to ambulate 30' with +2 assist in maximove. He required mod to max assist with for cognition and for communicative tasks. He requires max cues to initiate communication as well as swallow therefore remains NPO. Family education has been completed.    Discharge disposition: 06-Home-Health Care Svc  Diet: Jevity 237 mL 5 times daily per tube.  Free water  300 mL 4 times daily per tube  Special Instructions:  Keep PEG site clean and dry.   Allergies  as of 01/29/2024       Reactions   Penicillins Hives   Levetiracetam  Other (See Comments)   Altered mental status   Aricept  [donepezil ] Other (See Comments)   Dizziness   Exelon [rivastigmine] Other (See Comments)   Malaise   Viagra [sildenafil] Palpitations, Other (See Comments)   Dizziness, also        Medication List     STOP taking these medications    amLODipine  5 MG tablet Commonly known as: NORVASC    aspirin  81 MG chewable tablet   bethanechol  10 MG tablet Commonly known as: URECHOLINE    colchicine  0.6 MG tablet   tamsulosin  0.4 MG Caps capsule Commonly known as: FLOMAX        TAKE these medications    acetaminophen  325 MG tablet Commonly known as: TYLENOL  Place 2 tablets (650 mg total) into feeding tube 3 (three) times daily as needed for moderate pain (pain score 4-6). What changed: how to  take this   allopurinol  100 MG tablet Commonly known as: Zyloprim  Place 1 tablet (100 mg total) into feeding tube daily.   brimonidine  0.2 % ophthalmic solution Commonly known as: ALPHAGAN  Place 1 drop into both eyes 2 (two) times daily.   cephALEXin  500 MG capsule Commonly known as: KEFLEX  Place 1 capsule (500 mg total) into feeding tube every 12 (twelve) hours.   clopidogrel  75 MG tablet Commonly known as: PLAVIX  Place 1 tablet (75 mg total) into feeding tube daily.   dorzolamide  2 % ophthalmic solution Commonly known as: TRUSOPT  Place 1 drop into both eyes 2 (two) times daily.   feeding supplement (JEVITY 1.5 CAL/FIBER) Liqd Place 237 mLs into feeding tube 5 (five) times daily.   free water  Soln Place 300 mLs into feeding tube 4 (four) times daily - after meals and at bedtime.   galantamine  4 MG tablet Commonly known as: RAZADYNE  Place 1 tablet (4 mg total) into feeding tube daily with breakfast. What changed:  medication strength how much to take how to take this when to take this additional instructions   Gerhardt's butt cream Crea Apply 1  Application topically 2 (two) times daily. Notes to patient: Can use desitin   latanoprost  0.005 % ophthalmic solution Commonly known as: XALATAN  Place 1 drop into both eyes at bedtime. Notes to patient: Use desitin   melatonin 3 MG Tabs tablet Place 1 tablet (3 mg total) into feeding tube at bedtime. What changed: how to take this   memantine  10 MG tablet Commonly known as: NAMENDA  Place 1 tablet (10 mg total) into feeding tube 2 (two) times daily. What changed: how to take this   methylphenidate  10 MG tablet Commonly known as: RITALIN  Place 1 tablet (10 mg total) into feeding tube 2 (two) times daily with breakfast and lunch.   multivitamin with minerals Tabs tablet Place 1 tablet into feeding tube daily.        Discharge Instructions     Ambulatory referral to Neurology   Complete by: As directed    An appointment is requested in approximately: 6 weeks   Ambulatory referral to Physical Medicine Rehab   Complete by: As directed    Moderate complexity follow-up 1 to 2 weeks right basal ganglia infarction        Follow-up Information     [facility] Follow up.   Why: Call in 1-2 days for post hospital follow up. (Fax to [phone]) Contact information: [address] [facility] [place] [address] [phone]         Carilyn Prentice BRAVO, MD Follow up.   Specialty: Physical Medicine and Rehabilitation Why: office will call you with follow up appointment Contact information: [address] [place] [address] [phone]         [facility] Follow up.   Why: office will call you with follow up appointment Contact information: [address]     [address] [place]  [address] [phone]        Karalee Wilkie POUR, MD Follow up.   Specialties: Interventional Radiology, Radiology Why: Call for appointment when ready to take PEG out or if you have any problems with  it. Contact information: [address] [place] [address] [phone]         Sherrilee Belvie CROME, MD Follow up on 02/04/2024.   Specialty: Urology Why: 10:45 am appointment for follow up on UTI/blood in urine Contact information: [address]  [place] [address] [phone]                 Signed: Sharlet GORMAN Schmitz 02/01/2024, 10:03 PM

## 2024-01-18 NOTE — Progress Notes (Signed)
 Speech Language Pathology Daily Session Note  Patient Details  Name: Dustin Hamilton MRN: [id number] Date of Birth: [birth year]  Today's Date: 01/18/2024 SLP Individual Time: 0103-0200 SLP Individual Time Calculation (min): 57 min  Short Term Goals: Week 2: SLP Short Term Goal 1 (Week 2): Patient will initiate pharyngeal swallow in 80% of trials given max multimodal A SLP Short Term Goal 2 (Week 2): Patient will follow single step commands in functional tasks given max multimodal A SLP Short Term Goal 3 (Week 2): Patient will verbalize single words in 50% of opportunities given max multimodal A SLP Short Term Goal 4 (Week 2): Patient will sustain attention during functional tasks for 20 seconds given max multimodal A  Skilled Therapeutic Interventions:  Patient was seen in PM to address dysphagia, cognition, and language. Pt was  alert and seen at bedside. Both pt's niece and sister present, participating intermittently throughout session. Pt denied pain however endorsed fatigue when asked. SLP completed oral hygiene via suction toothbrush with pt following single step directions with mod A. He was subsequently presented with trials of small ice chips. Pt warranting max cues for bolus stripping across all trials. He subsequently demonstrated minimal to no oral manipulation with swallow initiation observed in first 2/5 trials. In 1 trial tactile stimulation to anterior neck facilitated swallow initiation and in final 2 trials pt noted with coughing before swallow suspect due to premature spillage.  Pt cued throughout for oral manipulation with limited return. Trials discontinued. In other minutes of session, SLP challenged pt in verbalization of biographical and personal information and following single step directions. Pt verbalized his name, DOB, wife and son's name indep. He identified family members in the room, named his siblings, and verbalized where he was from and living in [place] with min to  mod A. He verbalized information in a combination of single words and short phrases. At conclusion of session, pt was left upright in bed with family members present. SLP to continue POC.   Pain Pain Assessment Pain Scale: 0-10 Pain Score: 0-No pain  Therapy/Group: Individual Therapy  [name] 01/18/2024, 1:59 PM

## 2024-01-18 NOTE — Progress Notes (Signed)
 Occupational Therapy Session Note  Patient Details  Name: Dustin Hamilton MRN: [id number] Date of Birth: Mar 23, 1941  Today's Date: 01/18/2024 OT Individual Time: 8969-8884 OT Individual Time Calculation (min): 45 min    Short Term Goals: Week 2:  OT Short Term Goal 1 (Week 2): Pt will follow single step command during ADL with MOD multimodal cues OT Short Term Goal 2 (Week 2): Pt will tolerate sitting up in wc between therapy session 3/7 days OT Short Term Goal 3 (Week 2): Pt will maintain attention to familiar ADL for 30 sec with SUP OT Short Term Goal 4 (Week 2): Pt will maintain sitting balance EOB in preparation for functional transfer with MIN A consistently  Skilled Therapeutic Interventions/Progress Updates:     Pt received semi-reclined in bed with aunt and niece present in room upon OT arrival. Pt presenting to be in good spirits receptive to skilled OT session reporting 0/10 pain- OT offering intermittent rest breaks, repositioning, and therapeutic support to optimize participation in therapy session. Pt more alert this AM compared to previous sessions. Encouraged [name] family to assist in this session for family education, however the family members present this AM reporting they will not be assisting at d/c. Educated on importance of having family members who will be providing physical assistance come in for education with [name] family members receptive to education. Family members also inquiring about [name] progress and anticipated d/c date. Educated family that Pt is still requiring high level of assistance of MAX-total A+2 to complete ADLs and functional transfers and that he is making extremely slow progress towards reaching his LTG. [name] family reporting they have noticed improved alertness and increase movements of his L UE, however this is not functional as Pt is still limited by fatigue, L inattention, poor functional cognition, tone/spasticity in trunk/L hemibody, poor  sensation, and decreased strength. Medical/therapy team is trying to be realistic with anticipated functional outcomes for Pt to ensure [name] family is able to provide the necessary levels of assistance at d/c.   LB dressing task completed bed level to facilitate increased opportunities to work on rolling in bed. MAX A x2 required to roll to R. Pt able to assist with bending R LE and partially reaching across midline with R UE to assist with rolling to L. Donned pants total A. Pt transitioned to EOB with MAX A +2. Worked on task initiation sitting EOB. Pt able to locate LE with MOD HOH A, however unable to motor plan weaving L UE into sleeve requiring total A. Pt was then able to weave R UE into shirt and assist with bringing shirt overhead with MOD A. Oral care completed sitting EOB with MAX A required for sitting balance. MOD HOH A required initially fading to MIN/MOD A as Pt learned task. Engaged Pt in completing functional movement patterns bring hands to mouth and functional reaching completing self HOH A- MOD a required to sufficiently complete AAROM of L UE. Pt returned to bed total A +2 at end of session. Pt was left resting in bed with call bell in reach, bed alarm on, and all needs met.    Therapy Documentation Precautions:  Precautions Precautions: Fall Recall of Precautions/Restrictions: Impaired Precaution/Restrictions Comments: L hemipareisis, NPO, CorTrak Restrictions Weight Bearing Restrictions Per Provider Order: No   Therapy/Group: Individual Therapy  Katheryn SHAUNNA Mines 01/18/2024, 8:00 AM

## 2024-01-19 ENCOUNTER — Inpatient Hospital Stay (HOSPITAL_COMMUNITY)

## 2024-01-19 HISTORY — PX: IR GASTROSTOMY TUBE MOD SED: IMG625

## 2024-01-19 LAB — GLUCOSE, CAPILLARY
Glucose-Capillary: 98 mg/dL (ref 70–99)
Glucose-Capillary: 118 mg/dL — ABNORMAL HIGH (ref 70–99)
Glucose-Capillary: 98 mg/dL (ref 70–99)
Glucose-Capillary: 92 mg/dL (ref 70–99)

## 2024-01-19 MED ORDER — MIDAZOLAM HCL 2 MG/2ML IJ SOLN
INTRAMUSCULAR | Status: AC
Start: 2024-01-19 — End: 2024-01-19
  Filled 2024-01-19: qty 4

## 2024-01-19 MED ORDER — MIDAZOLAM HCL 2 MG/2ML IJ SOLN
INTRAMUSCULAR | Status: AC | PRN
  Administered 2024-01-19: 1 mg via INTRAVENOUS

## 2024-01-19 MED ORDER — METHYLPHENIDATE HCL 5 MG PO TABS: 15.0000 mg | ORAL_TABLET | Freq: Two times a day (BID) | ORAL | Status: DC

## 2024-01-19 MED ORDER — GLUCAGON HCL RDNA (DIAGNOSTIC) 1 MG IJ SOLR
INTRAMUSCULAR | Status: AC
  Filled 2024-01-19: qty 1

## 2024-01-19 MED ORDER — SODIUM CHLORIDE 0.9 % IV SOLN: INTRAVENOUS | Status: AC

## 2024-01-19 MED ORDER — FENTANYL CITRATE (PF) 100 MCG/2ML IJ SOLN
INTRAMUSCULAR | Status: AC | PRN
  Administered 2024-01-19: 50 ug via INTRAVENOUS

## 2024-01-19 MED ORDER — LIDOCAINE HCL (PF) 1 % IJ SOLN
20.0000 mL | Freq: Once | INTRAMUSCULAR | Status: AC
  Administered 2024-01-19: 20 mL
  Filled 2024-01-19: qty 20

## 2024-01-19 MED ORDER — LIDOCAINE-EPINEPHRINE 1 %-1:100000 IJ SOLN
INTRAMUSCULAR | Status: AC
  Filled 2024-01-19: qty 1

## 2024-01-19 MED ORDER — METHYLPHENIDATE HCL 5 MG PO TABS
10.0000 mg | ORAL_TABLET | Freq: Two times a day (BID) | ORAL | Status: DC
  Administered 2024-01-20 – 2024-01-29 (×20): 10 mg
  Filled 2024-01-19 (×20): qty 2

## 2024-01-19 MED ORDER — FENTANYL CITRATE (PF) 100 MCG/2ML IJ SOLN
INTRAMUSCULAR | Status: AC
  Filled 2024-01-19: qty 2

## 2024-01-19 MED ORDER — IOHEXOL 300 MG/ML  SOLN
50.0000 mL | Freq: Once | INTRAMUSCULAR | Status: AC | PRN
  Administered 2024-01-19: 25 mL

## 2024-01-19 MED ORDER — VANCOMYCIN HCL IN DEXTROSE 1-5 GM/200ML-% IV SOLN
INTRAVENOUS | Status: AC | PRN
  Administered 2024-01-19: 1000 mg via INTRAVENOUS

## 2024-01-19 MED ORDER — VANCOMYCIN HCL IN DEXTROSE 1-5 GM/200ML-% IV SOLN
INTRAVENOUS | Status: AC
Start: 2024-01-19 — End: 2024-01-19
  Filled 2024-01-19: qty 200

## 2024-01-19 MED ORDER — GLUCAGON HCL (RDNA) 1 MG IJ SOLR
INTRAMUSCULAR | Status: AC | PRN
  Administered 2024-01-19: 1 mg via INTRAVENOUS

## 2024-01-19 NOTE — Progress Notes (Signed)
 Physical Therapy Session Note  Patient Details  Name: MOHAN ERVEN MRN: [id number] Date of Birth: [birth year]  {CHL IP REHAB PT TIME CALCULATION:304800500}  Short Term Goals: {DUH:6958314}  Skilled Therapeutic Interventions/Progress Updates:      Therapy Documentation Precautions:  Precautions Precautions: Fall Recall of Precautions/Restrictions: Impaired Precaution/Restrictions Comments: L hemipareisis, NPO, CorTrak Restrictions Weight Bearing Restrictions Per Provider Order: No General:   Vital Signs: Therapy Vitals Temp: 98.4 F (36.9 C) Temp Source: Oral Pulse Rate: 85 Resp: 17 BP: (!) 135/93 Patient Position (if appropriate): Lying Oxygen Therapy SpO2: 99 % O2 Device: Room Air Pain: Pain Assessment Pain Score: 0-No pain Mobility:   Locomotion :    Trunk/Postural Assessment :    Balance:   Exercises:   Other Treatments:      Therapy/Group: {Therapy/Group:[id number]}  Mliss DELENA Milliner 01/19/2024, 8:02 AM

## 2024-01-19 NOTE — Progress Notes (Addendum)
 PROGRESS NOTE   Subjective/Complaints:  I saw patient shortly after he came back from completing his PEG.  No new complaints elicited.  Sister reports he is much more alert and awake in the afternoons and in the mornings.   ROS- limited by cognition   Objective:   IR GASTROSTOMY TUBE MOD SED Result Date: 01/19/2024 INDICATION: 83 year old male with dysphagia and protein calorie malnutrition. He has a chronic indwelling enteric feeding tube and presents for placement of a more durable percutaneous gastrostomy tube. EXAM: Fluoroscopically guided placement of percutaneous pull-through gastrostomy tube Interventional Radiologist:  Wilkie LOIS Lent, MD MEDICATIONS: 1 mg glucagon  administered intravenously by the Radiology nurse ANESTHESIA/SEDATION: Versed  1 mg IV; Fentanyl  50 mcg IV administered by the radiology nurse Moderate Sedation Time:  10 minutes The patient's vital signs and level of consciousness were continuously monitored during the procedure by the interventional radiology nurse under my direct supervision. CONTRAST:  25mL OMNIPAQUE  IOHEXOL  300 MG/ML  SOLN FLUOROSCOPY: Radiation exposure index: 7 mGy reference air kerma COMPLICATIONS: None immediate. PROCEDURE: Informed written consent was obtained from the patient after a thorough discussion of the procedural risks, benefits and alternatives. All questions were addressed. Maximal Sterile Barrier Technique was utilized including caps, mask, sterile gowns, sterile gloves, sterile drape, hand hygiene and skin antiseptic. A timeout was performed prior to the initiation of the procedure. Maximal barrier sterile technique utilized including caps, mask, sterile gowns, sterile gloves, large sterile drape, hand hygiene, and chlorhexadine skin prep. An angled catheter was advanced over a wire under fluoroscopic guidance through the nose, down the esophagus and into the body of the stomach. The  stomach was then insufflated with several 100 ml of air. Fluoroscopy confirmed location of the gastric bubble, as well as inferior displacement of the barium stained colon. Under direct fluoroscopic guidance, a single T-tack was placed, and the anterior gastric wall drawn up against the anterior abdominal wall. Percutaneous access was then obtained into the mid gastric body with an 18 gauge sheath needle. Aspiration of air, and injection of contrast material under fluoroscopy confirmed needle placement. An Amplatz wire was advanced in the gastric body and the access needle exchanged for a 9-French vascular sheath. A snare device was advanced through the vascular sheath and an Amplatz wire advanced through the angled catheter. The Amplatz wire was successfully snared and this was pulled up through the esophagus and out the mouth. A 20-French Suzen Gaskins MIC-PEG tube was then connected to the snare and pulled through the mouth, down the esophagus, into the stomach and out to the anterior abdominal wall. Hand injection of contrast material confirmed intragastric location. The T-tack retention suture was then cut. The pull through peg tube was then secured with the external bumper and capped. The patient will be observed for several hours with the newly placed tube on low wall suction to evaluate for any post procedure complication. The patient tolerated the procedure well, there is no immediate complication. IMPRESSION: Successful placement of a 20 French pull through gastrostomy tube. Electronically Signed   By: Wilkie Lent M.D.   On: 01/19/2024 09:34    Recent Labs    01/18/24 0616  WBC 5.6  HGB  13.9  HCT 42.1  PLT 213     Recent Labs    01/18/24 0616  NA 139  K 4.0  CL 103  CO2 26  GLUCOSE 138*  BUN 32*  CREATININE 0.83  CALCIUM  9.3      Intake/Output Summary (Last 24 hours) at 01/19/2024 1334 Last data filed at 01/19/2024 0448 Gross per 24 hour  Intake 1143 ml  Output 1950 ml   Net -807 ml        Physical Exam: Vital Signs Blood pressure (!) 148/94, pulse 74, temperature 98.4 F (36.9 C), temperature source Oral, resp. rate 16, height 5' 11 (1.803 m), weight 55 kg, SpO2 99%.  General: No acute distress, laying in bed, awakens to voice ENT- cortrak in nares  Masked facies  Mood and affect flat, opens eyes on command, minimally interactive Heart: Regular rate and rhythm no rubs murmurs or extra sounds Lungs: Clear to auscultation bilaterally though poor inspiratory effort, nonlabored breathing Abdomen: Positive bowel sounds, soft nontender to palpation, nondistended PEG tube in place Extremities: No clubbing, cyanosis, or edema Skin: No evidence of breakdown, no evidence of rash over exposed surfaces Neuro: eyes open and follows very simple command (to open eyes), nods/shakes head more today, but still minimally interactive.   Neurologic: masked facies, eyes closed , no spontaneous movement . motor strength is 4/5 in right deltoid, bicep, tricep, grip, 2-/5 LUE and LLE- MMT varies based on LOA - lost focus during LE MMT with limited attempt despite cuing  Sensory exam limited by attention and concentration  Cerebellar exam could not cooperate  Musculoskeletal: no pain with hip or knee ROM BLE  Prior neuro assessment is c/w today's exam 01/19/2024.    Assessment/Plan: 1. Functional deficits which require 3+ hours per day of interdisciplinary therapy in a comprehensive inpatient rehab setting. Physiatrist is providing close team supervision and 24 hour management of active medical problems listed below. Physiatrist and rehab team continue to assess barriers to discharge/monitor patient progress toward functional and medical goals  Care Tool:  Bathing    Body parts bathed by patient: Face   Body parts bathed by helper: Left arm, Abdomen, Chest, Right arm, Right upper leg, Left upper leg     Bathing assist Assist Level: 2 Helpers     Upper Body  Dressing/Undressing Upper body dressing   What is the patient wearing?: Pull over shirt    Upper body assist Assist Level: 2 Helpers    Lower Body Dressing/Undressing Lower body dressing      What is the patient wearing?: Underwear/pull up, Pants     Lower body assist Assist for lower body dressing: 2 Helpers     Toileting Toileting    Toileting assist Assist for toileting: Dependent - Patient 0%     Transfers Chair/bed transfer  Transfers assist  Chair/bed transfer activity did not occur: Safety/medical concerns  Chair/bed transfer assist level: Total Assistance - Patient < 25%     Locomotion Ambulation   Ambulation assist   Ambulation activity did not occur: Safety/medical concerns  Assist level: 2 helpers Assistive device: Other (comment) (3 musketeer support) Max distance: 6 ft   Walk 10 feet activity   Assist  Walk 10 feet activity did not occur: Safety/medical concerns        Walk 50 feet activity   Assist Walk 50 feet with 2 turns activity did not occur: Safety/medical concerns         Walk 150 feet activity   Assist  Walk 150 feet activity did not occur: Safety/medical concerns         Walk 10 feet on uneven surface  activity   Assist Walk 10 feet on uneven surfaces activity did not occur: Safety/medical concerns         Wheelchair     Assist Is the patient using a wheelchair?: Yes Type of Wheelchair: Manual Wheelchair activity did not occur: Safety/medical concerns  Wheelchair assist level: Dependent - Patient 0%      Wheelchair 50 feet with 2 turns activity    Assist    Wheelchair 50 feet with 2 turns activity did not occur: Safety/medical concerns   Assist Level: Dependent - Patient 0%   Wheelchair 150 feet activity     Assist  Wheelchair 150 feet activity did not occur: Safety/medical concerns   Assist Level: Dependent - Patient 0%   Blood pressure (!) 148/94, pulse 74, temperature 98.4 F  (36.9 C), temperature source Oral, resp. rate 16, height 5' 11 (1.803 m), weight 55 kg, SpO2 99%.  Medical Problem List and Plan: 1. 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  Will discuss further with team tomorrow team meeting 2.  Antithrombotics: -DVT/anticoagulation:  Pharmaceutical: Lovenox  40mg  daily             -antiplatelet therapy: ASA/Plavix  X 3 weeks followed by Plavix  alone Plavix  and aspirin  held for PEG-- lovenox  would need stopping too? WEEKDAY TEAM TO HOLD ONCE PEG SCHEDULED-looks like IR adjusted timing - starting tongiht  3. Pain Management: Tylenol  prn.  4. Mood/Behavior/Sleep: LCSW to follow for evaluation and support.             --hx of sundowning-->continue melatonin 3 mg/Monitor sleep hygeine             -antipsychotic agents: N/A -Reduced level of alertness trial amantadine - no significant changes will trial methylphenidate  10mg  BID starting noon 8/11 -8/15 patient reported to be a little more alert working with OT this morning.  Continue current regimen -8/19 Consider Increase methylphenidate  to 15 mg twice daily 5. Neuropsych/cognition: This patient is not capable of making decisions  6. Skin/Wound Care:  Routine pressure relief measures.  7. Fluids/Electrolytes/Nutrition: Continue NPO due to holding of orals.              --on tube feeds.  Appreciate dietitian assistance. 8. HTN: Monitor BP TID--goal 150 to avoid symptoms (multiple ED visits this year)             --hx of chronic dizziness. Needs to stay hydrated.  -8/19 fair control, continue to monitor trend Vitals:   01/18/24 0406 01/18/24  1404 01/18/24 1836 01/19/24 0447  BP: 134/78 (!) 145/96 (!) 136/95 (!) 135/93   01/19/24 0835 01/19/24 0840 01/19/24 0845 01/19/24 0850  BP: (!) 162/105 129/85 117/84 120/79   01/19/24 0855 01/19/24 0900 01/19/24 0905 01/19/24 0910  BP: 111/85 107/81 (!) 151/97 (!) 148/94    9. H/o SVT/CHF s/p PPM/syncope: Monitor for orthostatic symptoms   10. BPH/urinary retention: Urecholine  and Proscar  cannot be crushed per pharmacy  so will hold (cannot use Avodart  capsule due to TF).  -01/09/24 PVR documented once, 66; monitor; of note, getting urocholine 10mg  TID... -01/10/24 low PVRs but not many documented -01/17/24 poor documentation of PVRs, but using condom cath and output looks ok. Monitor.   -8/19 has not required recent IC, bladder scan volumes not particularly elevated.  Continue current regimen  11. 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 Glaucoma: Resume home gtts Alphagan , Trusopt , Xalatan . 13. Dysphagia: NPO with tube feeds. May need PEG for nutritional support. BUN up will increase free H20  - Patient has IR consult for PEG, CT scan completed  - PEG for tomorrow scheduled.  Tube feeds and free water  held at midnight.  - 8/19 PEG placed today, IR indicates diet may be advanced tomorrow.  Will start gentle IVF NS for 24h  14. Acute renal failure: Improved from 24/1.36 to 27/0.99>> 30/0.89  - 8/14 improved at creatinine 0.79/BUN 30  -8/18 BUN/creatinine stable at 32/0.83 15. Chronic Thrombocytopenia: stable/improving      Latest Ref Rng & Units 01/18/2024    6:16 AM 01/14/2024    5:03 AM 01/11/2024    4:57 AM  CBC  WBC 4.0 - 10.5 K/uL 5.6  7.0  5.6   Hemoglobin 13.0 - 17.0 g/dL 86.0  84.4  84.7   Hematocrit 39.0 - 52.0 % 42.1  47.6  45.2   Platelets 150 - 400 K/uL 213  179  138      16.  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  UA +  WBC but only rare bact, treat empirically keflex  500mg  BID x 3 d to see if this improves MS -No change in cognition noted  Urine blood tinged yesterday , monitor Hgb ok  Hgb is normal , mildly low plt also on asa , plavix  and enoxaparin , will monitor  May need to hold enoxaparin   8/15 urine recorded is yellow/straw, continue to monitor  18. Hx of gout: allopurinol  100mg  daily   LOS: 15 days A FACE TO FACE EVALUATION WAS PERFORMED  Murray Collier 01/19/2024, 1:34 PM

## 2024-01-19 NOTE — Progress Notes (Addendum)
 Speech Language Pathology Weekly Progress and Session Note  Patient Details  Name: Dustin Hamilton MRN: [id number] Date of Birth: [birth year]  Beginning of progress report period: January 12, 2024 End of progress report period: January 19, 2024  Today's Date: 01/19/2024 SLP Individual Time: 8699-8654 SLP Individual Time Calculation (min): 45 min  Short Term Goals: Week 2: SLP Short Term Goal 1 (Week 2): Patient will initiate pharyngeal swallow in 80% of trials given max multimodal A SLP Short Term Goal 1 - Progress (Week 2): Met SLP Short Term Goal 2 (Week 2): Patient will follow single step commands in functional tasks given max multimodal A SLP Short Term Goal 2 - Progress (Week 2): Met SLP Short Term Goal 3 (Week 2): Patient will verbalize single words in 50% of opportunities given max multimodal A SLP Short Term Goal 3 - Progress (Week 2): Met SLP Short Term Goal 4 (Week 2): Patient will sustain attention during functional tasks for 20 seconds given max multimodal A SLP Short Term Goal 4 - Progress (Week 2): Met    New Short Term Goals: Week 3: SLP Short Term Goal 1 (Week 3): STG = LTG due to ELOS  Weekly Progress Updates: Pt has made great gains and has met 4 of 4 STGs this reporting period due to improved communication, attention and dysphagia. This reporting period, patient has had  improvements in communication characterized by consistent simple verbalizations in response to questions. Patient is now participating in therapy and sustaining attention for short periods of time. Patient received PEG this date due to continued oral and suspected pharyngeal deficits as well as delayed swallow initiation requiring maxA (tactile/verbal).  Pt/family eduction ongoing. Pt would benefit from continued ST intervention to maximize communication, dysphagia and cognition in order to maximize functional independence at d/c.    Intensity: Minumum of 1-2 x/day, 30 to 90 minutes Frequency: 3 to 5  out of 7 days Duration/Length of Stay: TBD DME needs Treatment/Interventions: Cognitive remediation/compensation;Cueing hierarchy;Dysphagia/aspiration precaution training;Functional tasks;Internal/external aids;Multimodal communication approach;Oral motor exercises;Patient/family education;Speech/Language facilitation;Therapeutic Activities   Daily Session  Skilled Therapeutic Interventions:  Skilled therapy session focused on communicative and dysphagia goals. Patient awake and alert this session with increased verbalizations compared to prior. He independently stated name, counted 1-10 and recited days of the week. He required minA to recall months of the year. Receptively, patient answered simple yes/no questions with 80% accuracy and follow single step commands with 70% accuracy given maxA. Patient verbalized pain near PEG site with modA. SLP targeted dysphagia goals by providing oral care via suction toothbrush and offering ice chips. Patient initiated pharyngeal swallow in 4/5 opportunities given max A including verbal tactile and posterior lingual pressure. No s/sx of aspiration observed during trials of ice chips, however noted immediate coughing after swallowing secretions indicative of bolus misdirection. Recommend continuation of NPO w/ medications, hydration and nutrition offered via PEG. Patient left in bed with alarm set and call bell in reach. Caregiver present. Continue POC.     Pain Pain near PEG - nursing administered medications   Therapy/Group: Individual Therapy  Dustin Hamilton M.A., CCC-SLP 01/19/2024, 1:48 PM

## 2024-01-19 NOTE — Plan of Care (Signed)
  Problem: Consults Goal: RH STROKE PATIENT EDUCATION Description: See Patient Education module for education specifics  Outcome: Progressing   Problem: RH BOWEL ELIMINATION Goal: RH STG MANAGE BOWEL WITH ASSISTANCE Description: STG Manage Bowel with toileting min Assistance. Outcome: Progressing Goal: RH STG MANAGE BOWEL W/MEDICATION W/ASSISTANCE Description: STG Manage Bowel with Medication with mod I Assistance. Outcome: Progressing   Problem: RH BLADDER ELIMINATION Goal: RH STG MANAGE BLADDER WITH ASSISTANCE Description: STG Manage Bladder With min Assistance Outcome: Progressing Goal: RH STG MANAGE BLADDER WITH MEDICATION WITH ASSISTANCE Description: STG Manage Bladder With Medication With mod I Assistance. Outcome: Progressing   Problem: RH SAFETY Goal: RH STG ADHERE TO SAFETY PRECAUTIONS W/ASSISTANCE/DEVICE Description: STG Adhere to Safety Precautions With cues Assistance/Device. Outcome: Progressing   Problem: RH COGNITION-NURSING Goal: RH STG USES MEMORY AIDS/STRATEGIES W/ASSIST TO PROBLEM SOLVE Description: STG Uses Memory Aids/Strategies With min Assistance to Problem Solve. Outcome: Progressing   Problem: RH PAIN MANAGEMENT Goal: RH STG PAIN MANAGED AT OR BELOW [name] PAIN GOAL Description: Pain < 4 with prns Outcome: Progressing   Problem: RH KNOWLEDGE DEFICIT Goal: RH STG INCREASE KNOWLEDGE OF HYPERTENSION Description: Patient and wife will be able to manage HTN using educational resources for medications and dietary modification independently Outcome: Progressing Goal: RH STG INCREASE KNOWLEDGE OF DYSPHAGIA/FLUID INTAKE Description: Patient and wife will be able to manage dysphagia using educational resources for medications and dietary modification independentl Outcome: Progressing Goal: RH STG INCREASE KNOWLEDGE OF STROKE PROPHYLAXIS Description: Patient and wife will be able to manage secondary risks using educational resources for medications and dietary  modification independentl Outcome: Progressing

## 2024-01-19 NOTE — Procedures (Signed)
Interventional Radiology Procedure Note  Procedure: Placement of percutaneous 20F pull-through gastrostomy tube. Complications: None Recommendations: - NPO except for sips and chips remainder of today and overnight - Maintain G-tube to LWS until tomorrow morning  - May advance diet as tolerated and begin using tube tomorrow morning  Signed,  Jonmarc Bodkin K. Merle Cirelli, MD   

## 2024-01-19 NOTE — Plan of Care (Signed)
  Problem: RH Balance Goal: LTG Patient will maintain dynamic standing with ADLs (OT) Description: LTG:  Patient will maintain dynamic standing balance with assist during activities of daily living (OT)  Outcome: Not Applicable Note: Goal d/c'd based on [name] CLOF and ELOS   Problem: Sit to Stand Goal: LTG:  Patient will perform sit to stand in prep for activites of daily living with assistance level (OT) Description: LTG:  Patient will perform sit to stand in prep for activites of daily living with assistance level (OT) Outcome: Not Applicable Note: Goal d/c'd based on [name] CLOF and ELOS   Problem: RH Bathing Goal: LTG Patient will bathe all body parts with assist levels (OT) Description: LTG: Patient will bathe all body parts with assist levels (OT) Outcome: Not Applicable Note: Goal d/c'd based on [name] CLOF and ELOS   Problem: RH Toileting Goal: LTG Patient will perform toileting task (3/3 steps) with assistance level (OT) Description: LTG: Patient will perform toileting task (3/3 steps) with assistance level (OT)  Outcome: Not Applicable Note: Goal d/c'd based on [name] CLOF and ELOS   Problem: RH Toilet Transfers Goal: LTG Patient will perform toilet transfers w/assist (OT) Description: LTG: Patient will perform toilet transfers with assist, with/without cues using equipment (OT) Outcome: Not Applicable Note: Goal d/c'd based on [name] CLOF and ELOS   Problem: RH Tub/Shower Transfers Goal: LTG Patient will perform tub/shower transfers w/assist (OT) Description: LTG: Patient will perform tub/shower transfers with assist, with/without cues using equipment (OT) Outcome: Not Applicable Note: Goal d/c'd based on [name] CLOF and ELOS

## 2024-01-19 NOTE — Progress Notes (Addendum)
 Occupational Therapy Session Note  Patient Details  Name: Dustin Hamilton MRN: [id number] Date of Birth: June 19, 1940  Today's Date: 01/19/2024 OT TIME: 1416-1500 OT Total Time Calculation: 44 min    Short Term Goals: Week 3:  OT Short Term Goal 1 (Week 3): STG=LTG d/t ELOS  Skilled Therapeutic Interventions/Progress Updates:     8:00 AM- Attempted to see Pt for AM OT session, however Pt deeply sleeping as he had just returned from medical procedure of PEG placement. Pt unable to participate in therapy at this time d/t drowsiness and fatigue. Pt missed 45 min skilled OT session; will attempt to make up time as [name] status and schedule allow.   1416 PM- Returned to make-up time from AM session. Pt in resting in bed with NT present in room upon OT arrival. Pt alert with eyes open, receptive to skilled OT session reporting 0/10 pain. Pt able to verbalize how are you doing? And I don't think I am having any pain during session. Pt able nodding head yes/no to communicate need for rest breaks. Donned pants bed level to facilitate increased opportunities to work on rolling in bed. Pt able to assist with bending R LE and reaching R UE across midline towards bed rail requiring total A to fully complete roll to the L. Increased challenge rolling to R with total A x2 required to complete. Supine > EOB total A +2 using bed features. Increased trunkle tone noted. Squat pivot EOB > TISWC total A x2. Transported Pt total A to therapy gym in wc for time management and energy conservation. Positioned Pt in standing frame to work on upright tolerance, facilitate WB'ing through B LEs, work on trunk control, and to work on task initiation. Pt able to tolerate standing 4 minutes x2 trials in standing frame with seated rest breaks provided between trials- verbal/tactile cues and MIN A required to facilitate increased trunk extension. Played Pt desired music in standing with Pt able to tap R hand on table to beat of  music while OT facilitated WB'ing through L UE for improved proprioceptive feedback. Facilitated functional reaching and task initiation into activity with Pt able to doff squigz x3 off window anterior to Pt using R UE. Pt with improved alertness and task initiation this session. Pt was left resting in TISWC in therapy gym under supervision of therapy staff until upcoming PT session.   Therapy Documentation Precautions:  Precautions Precautions: Fall Recall of Precautions/Restrictions: Impaired Precaution/Restrictions Comments: L hemipareisis, NPO, CorTrak Restrictions Weight Bearing Restrictions Per Provider Order: No   Therapy/Group: Individual Therapy  Katheryn SHAUNNA Mines 01/19/2024, 10:49 AM

## 2024-01-19 NOTE — Progress Notes (Signed)
 Occupational Therapy Weekly Progress Note  Patient Details  Name: Dustin Hamilton MRN: [id number] Date of Birth: 10-Dec-1940  Beginning of progress report period: January 12, 2024 End of progress report period: January 19, 2024  Today's Date: 01/19/2024  Patient has met 1 of 4 short term goals. Pt is more alert this past reporting period compared to prior. Despite improved alertness, he continues to require total A +2 for all ADLs, bed mobility, and +2 assist for squat pivot transfers. His caregiver and wife have begun participating in therapy sessions this past week and will continue to participate in hands on training and education until he is d/c'd home. Goals have been downgraded to MAX A overall. POC will focus on decreasing bourdon of care, family/caregiver education, and increasing [name] overall functional status.  Patient continues to demonstrate the following deficits: muscle weakness and muscle joint tightness, decreased cardiorespiratoy endurance, impaired timing and sequencing, abnormal tone, unbalanced muscle activation, motor apraxia, decreased coordination, and decreased motor planning, decreased visual perceptual skills, decreased visual motor skills, and field cut, decreased attention to left, decreased initiation, decreased attention, decreased awareness, decreased problem solving, decreased safety awareness, decreased memory, and delayed processing, central origin, and decreased sitting balance, decreased standing balance, decreased postural control, hemiplegia, and decreased balance strategies and therefore will continue to benefit from skilled OT intervention to enhance overall performance with BADL and Reduce care partner burden.  Patient not progressing toward long term goals.  See goal revision..  Plan of care revisions: Goals downgraded based on [name] CLOF and ELOS. See POC note for details.  OT Short Term Goals Week 2:  OT Short Term Goal 1 (Week 2): Pt will follow single step  command during ADL with MOD multimodal cues OT Short Term Goal 1 - Progress (Week 2): Met OT Short Term Goal 2 (Week 2): Pt will tolerate sitting up in wc between therapy session 3/7 days OT Short Term Goal 2 - Progress (Week 2): Not met OT Short Term Goal 3 (Week 2): Pt will maintain attention to familiar ADL for 30 sec with SUP OT Short Term Goal 3 - Progress (Week 2): Not met OT Short Term Goal 4 (Week 2): Pt will maintain sitting balance EOB in preparation for functional transfer with MIN A consistently OT Short Term Goal 4 - Progress (Week 2): Not met Week 3:  OT Short Term Goal 1 (Week 3): STG=LTG d/t ELOS   Therapy Documentation Precautions:  Precautions Precautions: Fall Recall of Precautions/Restrictions: Impaired Precaution/Restrictions Comments: L hemipareisis, NPO, CorTrak Restrictions Weight Bearing Restrictions Per Provider Order: No   Therapy/Group: Individual Therapy  Katheryn SHAUNNA Mines 01/19/2024, 7:53 AM

## 2024-01-19 NOTE — Progress Notes (Addendum)
 Patient ID: Dustin Hamilton, male   DOB: 1941/03/06, 83 y.o.   MRN: [id number] Spoke with wife who is working on getting caregivers in place. [facility] probably not [facility] by Thursday so can not go home without this equipment in place. Wife is  aware [facility] and wants to appeal the discharge and push for an extension. Will message team and MD regarding this. Did talk with pt's sister at bedside and expressed concerns regarding amount of care pt will require upon discharge. She asked if wife can not do the care along with other caregivers does she bring him back to the hospital

## 2024-01-19 NOTE — Progress Notes (Signed)
 Physical Therapy Session Note  Patient Details  Name: Dustin Hamilton MRN: [id number] Date of Birth: Aug 02, 1940  Today's Date: 01/18/2024 PT Individual Time: 1415-1501 PT Individual Time Calculation (min): 46 min  Short Term Goals: Week 2:  PT Short Term Goal 1 (Week 2): Pt will complete bed mobility with MaxA +1. PT Short Term Goal 2 (Week 2): Pt will perform sit<>stand with MaxA +1. PT Short Term Goal 3 (Week 2): Pt will perform seat to seat pivot transfers with MaxA +2. PT Short Term Goal 4 (Week 2): Pt will maintain sitting balance with vc and self corrections and overall MinA for more than .  Skilled Therapeutic Interventions/Progress Updates:  Patient supine in bed on entrance to room. Patient alert and agreeable to PT session. More alert this session than in previous sessions and interacting in conversation attentively and verbally.   Patient with no pain complaint at start of session.  Hired caregiver present who is prepared for education re: transfers/ bed mobility. Recommended use of hoyer to transfer pt to/ from bed to w/c. TIS w/c has been recommended and requested by [facility] from [facility] Unsure at this time what will be provided for w/c at time of d/c.   Therapeutic Activity: Demonstrated bed rolling with pt and placement of hoyer pad requiring MaxA to TotA to complete to both sides. As pt will not initiate. Educated on positioning for proper placement of hoyer pad and how to fold/ roll, then unroll.   From bed to w/c, demonstrated use of closest straps in sling for UB and furthest for LB in order to position pt in more of a seated position during transfer. Setup w/c in good location to adjust hoyer and demonstrated use of pneumatic hand pump to lift pt. Turned pt in hoyer to properly place hoyer in front of w/c. Demonstrated use of handles on sling to more appropriately position in w/c. Then demonstrated how to determine good sitting position. Then educated and demonstrated safe  return of pt from w/c to bed including focus to positioning - especially while feeding. Informed that pt will have PEG tube placement prior to d/c and will have HH nursing to help provide care.   Demostrated use of tilt feature of w/c to change pressures to buttocks.    Educated that pt does have wound beginning on sacrum and will require consistent turning if he does not have an air mattress in hospital bed that is being delivered to home today. Will most like continue to use condom catheter. TotA for cleansing of bowel movements at bed level.   Patient supine in bed at end of session with brakes locked, bed alarm set, and all needs within reach.  Therapy Documentation Precautions:  Precautions Precautions: Fall Recall of Precautions/Restrictions: Impaired Precaution/Restrictions Comments: L hemipareisis, NPO, CorTrak Restrictions Weight Bearing Restrictions Per Provider Order: No  Pain:  No indication of pain this session.    Therapy/Group: Individual Therapy  Mliss DELENA Milliner PT, DPT, CSRS 01/18/2024, 4:29PAM

## 2024-01-20 ENCOUNTER — Other Ambulatory Visit (HOSPITAL_COMMUNITY): Payer: Self-pay

## 2024-01-20 DIAGNOSIS — D696 Thrombocytopenia, unspecified: Secondary | ICD-10-CM

## 2024-01-20 LAB — GLUCOSE, CAPILLARY
Glucose-Capillary: 79 mg/dL (ref 70–99)
Glucose-Capillary: 159 mg/dL — ABNORMAL HIGH (ref 70–99)
Glucose-Capillary: 78 mg/dL (ref 70–99)
Glucose-Capillary: 115 mg/dL — ABNORMAL HIGH (ref 70–99)

## 2024-01-20 MED ORDER — ADULT MULTIVITAMIN W/MINERALS CH: 1.0000 | ORAL_TABLET | Freq: Every day | ORAL | 0 refills | Status: DC

## 2024-01-20 MED ORDER — FREE WATER
200.0000 mL | Freq: Four times a day (QID) | Status: DC
  Administered 2024-01-20 – 2024-01-25 (×20): 200 mL

## 2024-01-20 MED ORDER — ADULT MULTIVITAMIN W/MINERALS CH
1.0000 | ORAL_TABLET | Freq: Every day | ORAL | 0 refills | Status: DC
  Filled 2024-01-20: qty 14, 14d supply, fill #0
  Filled 2024-01-20: qty 30, 30d supply, fill #0

## 2024-01-20 MED ORDER — MELATONIN 3 MG PO TABS: 3.0000 mg | ORAL_TABLET | Freq: Every day | ORAL | 0 refills | Status: DC

## 2024-01-20 MED ORDER — GERHARDT'S BUTT CREAM
1.0000 | TOPICAL_CREAM | Freq: Two times a day (BID) | CUTANEOUS | 0 refills | Status: DC
  Filled 2024-01-20: qty 60, 30d supply, fill #0

## 2024-01-20 MED ORDER — METHYLPHENIDATE HCL 10 MG PO TABS
10.0000 mg | ORAL_TABLET | Freq: Two times a day (BID) | ORAL | 0 refills | Status: DC
  Filled 2024-01-20: qty 28, 14d supply, fill #0

## 2024-01-20 MED ORDER — GERHARDT'S BUTT CREAM: 1.0000 | TOPICAL_CREAM | Freq: Two times a day (BID) | CUTANEOUS | 0 refills | Status: AC

## 2024-01-20 MED ORDER — JEVITY 1.5 CAL/FIBER PO LIQD
120.0000 mL | Freq: Four times a day (QID) | ORAL | Status: AC
  Administered 2024-01-20 (×4): 120 mL
  Filled 2024-01-20 (×4): qty 237

## 2024-01-20 MED ORDER — GALANTAMINE HYDROBROMIDE 4 MG PO TABS
4.0000 mg | ORAL_TABLET | Freq: Every day | ORAL | 0 refills | Status: DC
  Filled 2024-01-20: qty 14, 14d supply, fill #0

## 2024-01-20 MED ORDER — GALANTAMINE HYDROBROMIDE 4 MG PO TABS: 4.0000 mg | ORAL_TABLET | Freq: Every day | ORAL | 0 refills | Status: AC

## 2024-01-20 MED ORDER — MEMANTINE HCL 10 MG PO TABS: 10.0000 mg | ORAL_TABLET | Freq: Two times a day (BID) | ORAL | 0 refills | Status: AC

## 2024-01-20 MED ORDER — CLOPIDOGREL BISULFATE 75 MG PO TABS
75.0000 mg | ORAL_TABLET | Freq: Every day | ORAL | Status: DC
  Administered 2024-01-21 – 2024-01-25 (×5): 75 mg via ORAL
  Filled 2024-01-20 (×5): qty 1

## 2024-01-20 MED ORDER — ASPIRIN 81 MG PO CHEW
81.0000 mg | CHEWABLE_TABLET | Freq: Every day | ORAL | Status: DC
  Administered 2024-01-21 – 2024-01-25 (×5): 81 mg via ORAL
  Filled 2024-01-20 (×5): qty 1

## 2024-01-20 MED ORDER — CLOPIDOGREL BISULFATE 75 MG PO TABS: 75.0000 mg | ORAL_TABLET | Freq: Every day | ORAL | 0 refills | Status: DC

## 2024-01-20 MED ORDER — ACETAMINOPHEN 325 MG PO TABS: 650.0000 mg | ORAL_TABLET | Freq: Three times a day (TID) | ORAL | Status: AC | PRN

## 2024-01-20 MED ORDER — CLOPIDOGREL BISULFATE 75 MG PO TABS
75.0000 mg | ORAL_TABLET | Freq: Every day | ORAL | 0 refills | Status: DC
  Filled 2024-01-20: qty 14, 14d supply, fill #0

## 2024-01-20 MED ORDER — METHYLPHENIDATE HCL 10 MG PO TABS: 10.0000 mg | ORAL_TABLET | Freq: Two times a day (BID) | ORAL | 0 refills | Status: DC

## 2024-01-20 MED ORDER — MEMANTINE HCL 10 MG PO TABS
10.0000 mg | ORAL_TABLET | Freq: Two times a day (BID) | ORAL | 0 refills | Status: DC
  Filled 2024-01-20: qty 14, 7d supply, fill #0

## 2024-01-20 MED ORDER — JEVITY 1.5 CAL/FIBER PO LIQD
237.0000 mL | Freq: Every day | ORAL | Status: DC
  Administered 2024-01-21 – 2024-01-29 (×41): 237 mL
  Filled 2024-01-20 (×47): qty 237

## 2024-01-20 MED ORDER — MELATONIN 3 MG PO TABS
3.0000 mg | ORAL_TABLET | Freq: Every day | ORAL | 0 refills | Status: DC
  Filled 2024-01-20: qty 14, 14d supply, fill #0
  Filled 2024-01-20: qty 30, 30d supply, fill #0

## 2024-01-20 NOTE — Progress Notes (Addendum)
 Patient ID: Dustin Hamilton, male   DOB: [birth year], 83 y.o.   MRN: [id number]  Met with wife to give team conference update she still does not have the TIS wheelchair but does have the bed and hoyer lift. She continues to try to arrange daytime caregiver she does have a night time one. Will order tube feedings now via [facility] unsure how long this will take. She has been shown to do the PEG feedings and medications via PEG. Will continue to work on getting things in place for discharge home.  Wife wants [facility] for home health since has had before. Will make referral.

## 2024-01-20 NOTE — Progress Notes (Signed)
 PROGRESS NOTE   Subjective/Complaints:  A little more alert this AM than prior mornings. Family in the room, concerned that equipment has not arrived from [facility].      ROS- limited by cognition   Objective:   IR GASTROSTOMY TUBE MOD SED Result Date: 01/19/2024 INDICATION: 83 year old male with dysphagia and protein calorie malnutrition. He has a chronic indwelling enteric feeding tube and presents for placement of a more durable percutaneous gastrostomy tube. EXAM: Fluoroscopically guided placement of percutaneous pull-through gastrostomy tube Interventional Radiologist:  Wilkie LOIS Lent, MD MEDICATIONS: 1 mg glucagon  administered intravenously by the Radiology nurse ANESTHESIA/SEDATION: Versed  1 mg IV; Fentanyl  50 mcg IV administered by the radiology nurse Moderate Sedation Time:  10 minutes The patient's vital signs and level of consciousness were continuously monitored during the procedure by the interventional radiology nurse under my direct supervision. CONTRAST:  25mL OMNIPAQUE  IOHEXOL  300 MG/ML  SOLN FLUOROSCOPY: Radiation exposure index: 7 mGy reference air kerma COMPLICATIONS: None immediate. PROCEDURE: Informed written consent was obtained from the patient after a thorough discussion of the procedural risks, benefits and alternatives. All questions were addressed. Maximal Sterile Barrier Technique was utilized including caps, mask, sterile gowns, sterile gloves, sterile drape, hand hygiene and skin antiseptic. A timeout was performed prior to the initiation of the procedure. Maximal barrier sterile technique utilized including caps, mask, sterile gowns, sterile gloves, large sterile drape, hand hygiene, and chlorhexadine skin prep. An angled catheter was advanced over a wire under fluoroscopic guidance through the nose, down the esophagus and into the body of the stomach. The stomach was then insufflated with several 100 ml of air.  Fluoroscopy confirmed location of the gastric bubble, as well as inferior displacement of the barium stained colon. Under direct fluoroscopic guidance, a single T-tack was placed, and the anterior gastric wall drawn up against the anterior abdominal wall. Percutaneous access was then obtained into the mid gastric body with an 18 gauge sheath needle. Aspiration of air, and injection of contrast material under fluoroscopy confirmed needle placement. An Amplatz wire was advanced in the gastric body and the access needle exchanged for a 9-French vascular sheath. A snare device was advanced through the vascular sheath and an Amplatz wire advanced through the angled catheter. The Amplatz wire was successfully snared and this was pulled up through the esophagus and out the mouth. A 20-French Suzen Gaskins MIC-PEG tube was then connected to the snare and pulled through the mouth, down the esophagus, into the stomach and out to the anterior abdominal wall. Hand injection of contrast material confirmed intragastric location. The T-tack retention suture was then cut. The pull through peg tube was then secured with the external bumper and capped. The patient will be observed for several hours with the newly placed tube on low wall suction to evaluate for any post procedure complication. The patient tolerated the procedure well, there is no immediate complication. IMPRESSION: Successful placement of a 20 French pull through gastrostomy tube. Electronically Signed   By: Wilkie Lent M.D.   On: 01/19/2024 09:34    Recent Labs    01/18/24 0616  WBC 5.6  HGB 13.9  HCT 42.1  PLT 213  Recent Labs    01/18/24 0616  NA 139  K 4.0  CL 103  CO2 26  GLUCOSE 138*  BUN 32*  CREATININE 0.83  CALCIUM  9.3      Intake/Output Summary (Last 24 hours) at 01/20/2024 1311 Last data filed at 01/20/2024 1235 Gross per 24 hour  Intake 796.84 ml  Output 1525 ml  Net -728.16 ml        Physical Exam: Vital  Signs Blood pressure 137/87, pulse 87, temperature 98.2 F (36.8 C), temperature source Oral, resp. rate 16, height 5' 11 (1.803 m), weight 55 kg, SpO2 96%.  General: No acute distress, laying in bed, alert but drowsy ENT- cortrak in nares  Masked facies  Mood and affect flat, opens eyes on command, minimally interactive Heart: Regular rate and rhythm no rubs murmurs or extra sounds Lungs: Clear to auscultation bilaterally though poor inspiratory effort, nonlabored breathing Abdomen: Positive bowel sounds, soft nontender to palpation, nondistended PEG tube in place- looks ok  Extremities: No clubbing, cyanosis, or edema Skin: No evidence of breakdown, no evidence of rash over exposed surfaces Neuro: eyes open and follows very simple command (to open eyes), nods/shakes head more today, but still minimally interactive.   Neurologic: masked facies, eyes closed , no spontaneous movement . motor strength is 4/5 in right deltoid, bicep, tricep, grip, 2-/5 LUE and LLE- MMT varies based on LOA - lost focus during LE MMT with limited attempt despite cuing  Sensory exam limited by attention and concentration  Cerebellar exam could not cooperate  Musculoskeletal: no pain with hip or knee ROM BLE  Prior neuro assessment is c/w today's exam 01/20/2024.    Assessment/Plan: 1. Functional deficits which require 3+ hours per day of interdisciplinary therapy in a comprehensive inpatient rehab setting. Physiatrist is providing close team supervision and 24 hour management of active medical problems listed below. Physiatrist and rehab team continue to assess barriers to discharge/monitor patient progress toward functional and medical goals  Care Tool:  Bathing    Body parts bathed by patient: Face   Body parts bathed by helper: Left arm, Abdomen, Chest, Right arm, Right upper leg, Left upper leg     Bathing assist Assist Level: 2 Helpers     Upper Body Dressing/Undressing Upper body dressing    What is the patient wearing?: Pull over shirt    Upper body assist Assist Level: 2 Helpers    Lower Body Dressing/Undressing Lower body dressing      What is the patient wearing?: Underwear/pull up, Pants     Lower body assist Assist for lower body dressing: 2 Helpers     Toileting Toileting    Toileting assist Assist for toileting: Dependent - Patient 0%     Transfers Chair/bed transfer  Transfers assist  Chair/bed transfer activity did not occur: Safety/medical concerns  Chair/bed transfer assist level: Total Assistance - Patient < 25%     Locomotion Ambulation   Ambulation assist   Ambulation activity did not occur: Safety/medical concerns  Assist level: 2 helpers Assistive device: Other (comment) (3 musketeer support) Max distance: 6 ft   Walk 10 feet activity   Assist  Walk 10 feet activity did not occur: Safety/medical concerns        Walk 50 feet activity   Assist Walk 50 feet with 2 turns activity did not occur: Safety/medical concerns         Walk 150 feet activity   Assist Walk 150 feet activity did not occur: Safety/medical concerns  Walk 10 feet on uneven surface  activity   Assist Walk 10 feet on uneven surfaces activity did not occur: Safety/medical concerns         Wheelchair     Assist Is the patient using a wheelchair?: Yes Type of Wheelchair: Manual Wheelchair activity did not occur: Safety/medical concerns  Wheelchair assist level: Dependent - Patient 0%      Wheelchair 50 feet with 2 turns activity    Assist    Wheelchair 50 feet with 2 turns activity did not occur: Safety/medical concerns   Assist Level: Dependent - Patient 0%   Wheelchair 150 feet activity     Assist  Wheelchair 150 feet activity did not occur: Safety/medical concerns   Assist Level: Dependent - Patient 0%   Blood pressure 137/87, pulse 87, temperature 98.2 F (36.8 C), temperature source Oral, resp. rate  16, height 5' 11 (1.803 m), weight 55 kg, SpO2 96%.  Medical Problem List and Plan: 1. 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  Will discuss further with team tomorrow team meeting -Team conference today please see physician documentation under team conference tab, met with team  to discuss problems,progress, and goals. Formulized individual treatment plan based on medical history, underlying problem and comorbidities.  -Pt still waiting on WC and wife working on getting daytime home care assistant  2.  Antithrombotics: -DVT/anticoagulation:  Pharmaceutical: Lovenox  40mg  daily             -antiplatelet therapy: ASA/Plavix  X 3 weeks followed by Plavix  alone Plavix  and aspirin  held for PEG-- Restart   3. Pain Management: Tylenol  prn.  4. Mood/Behavior/Sleep: LCSW to follow for evaluation and support.             --hx of sundowning-->continue melatonin 3 mg/Monitor sleep hygeine             -antipsychotic agents: N/A -Reduced level of alertness trial amantadine - no significant changes will trial methylphenidate  10mg  BID starting noon 8/11 -8/15 patient reported to be a little more alert working with OT this morning.  Continue current regimen -8/20 Continue methylphenidate  to 10 mg twice daily 5. Neuropsych/cognition: This patient is not capable of making decisions  6. Skin/Wound Care:  Routine pressure relief measures.  7. Fluids/Electrolytes/Nutrition: Continue NPO due to holding of orals.              --on tube feeds.  Appreciate dietitian assistance. 8. HTN: Monitor BP TID--goal 150 to avoid symptoms (multiple ED visits  this year)             --hx of chronic dizziness. Needs to stay hydrated.  -8/19-20 fair control, continue to monitor trend Vitals:   01/19/24 0447 01/19/24 0835 01/19/24 0840 01/19/24 0845  BP: (!) 135/93 (!) 162/105 129/85 117/84   01/19/24 0850 01/19/24 0855 01/19/24 0900 01/19/24 0905  BP: 120/79 111/85 107/81 (!) 151/97   01/19/24 0910 01/19/24 1625 01/19/24 1958 01/20/24 0417  BP: (!) 148/94 (!) 134/94 (!) 156/98 137/87    9. H/o SVT/CHF s/p PPM/syncope: Monitor for orthostatic symptoms  10. BPH/urinary retention: Urecholine  and Proscar  cannot be crushed per pharmacy so will hold (cannot use Avodart  capsule due to TF).  -01/09/24 PVR documented once, 66; monitor; of note, getting urocholine 10mg  TID... -01/10/24 low PVRs but not many documented -01/17/24 poor documentation of PVRs, but using condom cath and output looks ok. Monitor.   -8/19 has not required recent IC, bladder scan volumes not particularly elevated.  8/20 bladder scan 0-230 continue to monitor   11. 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 Glaucoma: Resume home gtts Alphagan , Trusopt , Xalatan . 13. Dysphagia: NPO with tube feeds. May need PEG for nutritional support. BUN up will increase free H20  - Patient has IR consult for PEG, CT scan completed  - PEG for tomorrow scheduled.  Tube feeds and free water  held at midnight.  - 8/19 PEG placed today, IR indicates diet may be advanced tomorrow.  Will start gentle IVF NS for 24h   --8/20 Tube feeding starting today, Will not continue IVF after this afternoon  14. Acute renal failure: Improved from 24/1.36 to 27/0.99>> 30/0.89  - 8/14 improved at creatinine 0.79/BUN 30  -8/18 BUN/creatinine stable at 32/0.83   -8/20 Free water  restarted by PEG today  15. Chronic Thrombocytopenia: stable/improving    Recheck tomorrow     Latest Ref Rng & Units 01/18/2024    6:16 AM 01/14/2024    5:03  AM 01/11/2024    4:57 AM  CBC  WBC 4.0 - 10.5 K/uL 5.6  7.0  5.6   Hemoglobin 13.0 - 17.0 g/dL 86.0  84.4  84.7   Hematocrit 39.0 - 52.0 % 42.1  47.6  45.2   Platelets 150 - 400 K/uL 213  179  138      16.  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  UA + WBC but only rare bact, treat empirically keflex  500mg  BID x 3 d to see if this improves MS -No change in cognition noted  Urine blood tinged yesterday , monitor Hgb ok  Hgb is normal , mildly low plt also on asa , plavix  and enoxaparin , will monitor  May need to hold enoxaparin   8/15 urine recorded is yellow/straw, continue to monitor  18. Hx of gout: allopurinol  100mg  daily   LOS: 16 days A FACE TO FACE EVALUATION WAS PERFORMED  Murray Collier 01/20/2024, 1:11 PM

## 2024-01-20 NOTE — Progress Notes (Signed)
 Occupational Therapy Session Note  Patient Details  Name: Dustin Hamilton MRN: [id number] Date of Birth: 04/20/1941  Today's Date: 01/20/2024 OT Individual Time: 0952-1030 OT Individual Time Calculation (min): 38 min    Short Term Goals: Week 3:  OT Short Term Goal 1 (Week 3): STG=LTG d/t ELOS  Skilled Therapeutic Interventions/Progress Updates:     Pt received semi-reclined in bed with RN present in room providing bolus feed and morning medications. Pt presenting to be alert, receptive to skilled OT session reporting 0/10 pain- OT offering intermittent rest breaks, repositioning, and therapeutic support to optimize participation in therapy session. [name] wife, sister, and niece present in room throughout session. RN providing education on technique for completing PEG feed and provide medication with OT collaborating by education on positioning during feeds. Spent time at beginning of session providing wife and family members with updates on [name] functional status and CVA recovery process. [name] wife receptive to education, however expressing unrealistic expectations for [name] progress. Continued to gently express that Pt is going to require heavy level of assist for the foreseen future. Dicussed d/c plans with wife- wife reporting the air mattress hospital bed, suction, and  hoyer lift have been delivered, however she is still waiting on TISWC to be delivered and she is in the process of setting up caregiver support for the daytime.  Educated [name] family on techniques for completing bed level ADLs with demonstration provided for donning pants and simple cues to provide to increase [name] participation in bed mobility tasks. Encouraged family to continue to promote [name] participation in ADLs to support moral. Education initiated on skin breakdown prevention. Pt transitioned to EOB using bed features with total A +2 MIN A. Squat pivot to L completed with total A +2. Pt was able to assist with bringing  trunk anteriorly in preparation for transfer. Pt was left resting in [facility] with call bell in reach, seatbelt alarm on, family present in room, and all needs met.    Therapy Documentation Precautions:  Precautions Precautions: Fall Recall of Precautions/Restrictions: Impaired Precaution/Restrictions Comments: L hemipareisis, NPO, CorTrak Restrictions Weight Bearing Restrictions Per Provider Order: No   Therapy/Group: Individual Therapy  Katheryn SHAUNNA Mines 01/20/2024, 7:28 AM

## 2024-01-20 NOTE — Plan of Care (Signed)
  Problem: Consults Goal: RH STROKE PATIENT EDUCATION Description: See Patient Education module for education specifics  Outcome: Progressing   Problem: RH BOWEL ELIMINATION Goal: RH STG MANAGE BOWEL WITH ASSISTANCE Description: STG Manage Bowel with toileting min Assistance. Outcome: Progressing Goal: RH STG MANAGE BOWEL W/MEDICATION W/ASSISTANCE Description: STG Manage Bowel with Medication with mod I Assistance. Outcome: Progressing   Problem: RH BLADDER ELIMINATION Goal: RH STG MANAGE BLADDER WITH ASSISTANCE Description: STG Manage Bladder With min Assistance Outcome: Progressing Goal: RH STG MANAGE BLADDER WITH MEDICATION WITH ASSISTANCE Description: STG Manage Bladder With Medication With mod I Assistance. Outcome: Progressing   Problem: RH SAFETY Goal: RH STG ADHERE TO SAFETY PRECAUTIONS W/ASSISTANCE/DEVICE Description: STG Adhere to Safety Precautions With cues Assistance/Device. Outcome: Progressing   Problem: RH COGNITION-NURSING Goal: RH STG USES MEMORY AIDS/STRATEGIES W/ASSIST TO PROBLEM SOLVE Description: STG Uses Memory Aids/Strategies With min Assistance to Problem Solve. Outcome: Progressing   Problem: RH PAIN MANAGEMENT Goal: RH STG PAIN MANAGED AT OR BELOW [name] PAIN GOAL Description: Pain < 4 with prns Outcome: Progressing   Problem: RH KNOWLEDGE DEFICIT Goal: RH STG INCREASE KNOWLEDGE OF HYPERTENSION Description: Patient and wife will be able to manage HTN using educational resources for medications and dietary modification independently Outcome: Progressing Goal: RH STG INCREASE KNOWLEDGE OF DYSPHAGIA/FLUID INTAKE Description: Patient and wife will be able to manage dysphagia using educational resources for medications and dietary modification independentl Outcome: Progressing Goal: RH STG INCREASE KNOWLEDGE OF STROKE PROPHYLAXIS Description: Patient and wife will be able to manage secondary risks using educational resources for medications and dietary  modification independentl Outcome: Progressing

## 2024-01-20 NOTE — Progress Notes (Signed)
 Physical Therapy Session Note  Patient Details  Name: GEORG ANG MRN: [id number] Date of Birth: September 08, 1940  Today's Date: 01/20/2024 PT Co-Treatment Time: 1131-1204 (Total time SLP cotreat 1100-1204) PT Co-Treatment Time Calculation (min): 33 min  Short Term Goals: {DUH:[id number]}  Skilled Therapeutic Interventions/Progress Updates:      Therapy Documentation Precautions:  Precautions Precautions: Fall Recall of Precautions/Restrictions: Impaired Precaution/Restrictions Comments: L hemipareisis, NPO, CorTrak Restrictions Weight Bearing Restrictions Per Provider Order: No General:   Vital Signs:   Pain:   Mobility:   Locomotion :    Trunk/Postural Assessment :    Balance:   Exercises:   Other Treatments:      Therapy/Group: {Therapy/Group:[id number]}  Mliss DELENA Milliner 01/20/2024, 12:34 PM

## 2024-01-20 NOTE — Progress Notes (Signed)
 Nutrition Follow-up  DOCUMENTATION CODES:   Severe malnutrition in context of chronic illness  INTERVENTION:   Transition to bolus feeds via PEG, recommend: Jevity 1.5: 5 237 mL cartons per day (5 total feedings), 30mL water  flush before + after each bolus (8am, 11am, 2pm, 5pm, 8pm) Begin offering 1/2 carton at first 4 feeds for the first day After first day, offer 1 full carton at each feed Free water  flushes: 4 x per day in between feeds (9am, 12pm, 3pm, 6pm) Provides 1,775 kcal, 75 g protein, 2,058mL water  (TF free water + water  flushes)  Educated pt's wife and caretaker on bolus feeding schedule, free water  flushes, and tube maintenance; printed copies of feeding regimen for all members of at home care team  Added bolus feed regimen to AVS and discussed discharge formula regimen with LCSW  NUTRITION DIAGNOSIS:   Severe Malnutrition related to chronic illness as evidenced by severe muscle depletion, severe fat depletion, percent weight loss (12% weight loss within 3 months); ongoing. Remains applicable  GOAL:   Patient will meet greater than or equal to 90% of their needs; met with TF.  MONITOR:   TF tolerance, Diet advancement  REASON FOR ASSESSMENT:   Consult    ASSESSMENT:   83 yo male admitted with functional decline r/t stroke. PMH includes HTN, CVD, BPH, CVA, former alcohol  and mixed drug abuse, glaucoma, dementia, Parkinsonism.  8/19 PEG placement  Spoke with pt's family at bedside and caretaker hired for pt's anticipated discharge. Educated on bolus regimen for discharge and provided copies of Bolus or Syringe Tube Feeding Instructions handout  from the [employer] with pt's feeding regimen outlined for all members of discharge care team. Answered questions/concerns of wife and caretaker.   IR cleared PEG for use this morning and site looks appropriate. RN demonstrated bolus feed to wife this morning and demonstrated medicine  crushing and how to use tube to administer medicines. RN used teach back method and wife then practiced herself.   Pt seemed to tolerate half bolus feeds and will transition to full carton boluses tomorrow. Original plan for discharge 8/21 but will be delayed due to delay in outpatient supplies being sent to house. Likely discharge will be next week.  Medications reviewed and include:  Ritalin   MVI w/ minerals  Labs reviewed:  CBG x 24 hr: 79-159 mg/dL  Diet Order:   Diet Order             Diet NPO time specified Except for: Ice Chips  Diet effective now                   EDUCATION NEEDS:   No education needs have been identified at this time  Skin:  Skin Assessment: Reviewed RN Assessment (irritant contact dermatitis to coccyx)  Last BM:  8/19  Height:   Ht Readings from Last 1 Encounters:  01/04/24 5' 11 (1.803 m)    Weight:   Wt Readings from Last 1 Encounters:  01/18/24 55 kg    Ideal Body Weight:  78.2 kg  BMI:  Body mass index is 16.91 kg/m.  Estimated Nutritional Needs:   Kcal:  1750-1950  Protein:  80-100 gm  Fluid:  1.75-1.95 L   Josette Glance, MS, RDN, LDN Clinical Dietitian I Please reach out via secure chat

## 2024-01-20 NOTE — Progress Notes (Signed)
 Physical Therapy Session Note  Patient Details  Name: Dustin Hamilton MRN: [id number] Date of Birth: 10-Nov-1940  Today's Date: 01/20/2024 PT Individual Time: 1422-1516 PT Individual Time Calculation (min): 54 min   Short Term Goals: Week 1:  PT Short Term Goal 1 (Week 1): Pt will perform bed rolling with MaxA +1. PT Short Term Goal 1 - Progress (Week 1): Not met PT Short Term Goal 2 (Week 1): Pt will perform supine<>sit with MaxA +1. PT Short Term Goal 2 - Progress (Week 1): Not met PT Short Term Goal 3 (Week 1): Pt will perform sit<>stand with MaxA +2. PT Short Term Goal 3 - Progress (Week 1): Met PT Short Term Goal 4 (Week 1): Pt will maintain unsupported seated balance with MinA for more than 3 min. PT Short Term Goal 4 - Progress (Week 1): Progressing toward goal PT Short Term Goal 5 (Week 1): Pt will demonstrate improved initiation by following more than 50% of verbal instructions for mobility. PT Short Term Goal 5 - Progress (Week 1): Progressing toward goal  Skilled Therapeutic Interventions/Progress Updates:  Patient supine in bed on entrance to room. Patient alert and agreeable to PT session. IV running.   Patient with no pain complaint at start of session.  Therapeutic Activity: Session with focus on demonstrating hoyer use and transfer of pt bed <> w/c to wife. Hired evening caregiver also present. Demonstrated placement of hoyer pad with correct positioning under pt. Pt requiring TotA to roll in bed. Demonstrated how to assist arms safely over body to grasp rail while using LE to push hips over at same time to maintain trunk stability. Explained use of colored loops to place on hoyer lift. Use UB loops closer to pt for bed>w/c transfer to aid in upright positioning and loops1-2 further away for return to bed for better positioning into supine. Requires cross looping of leg straps for security. Recommended 2-person assist for improved safety and efficiency for improved  positioning of pt.   Also instructed wife on brake application, tilt feature and reasoning for adjusting pressure to pt throughout day, and application/ removal of leg rests for TIS w/c. This was requested from CSW to [employer] Unsure yet what [employer] will deliver.   Patient supine in bed at end of session with brakes locked, bed alarm set, and all needs within reach. Wife and caregiver present. Music playing for pt.    Therapy Documentation Precautions:  Precautions Precautions: Fall Recall of Precautions/Restrictions: Impaired Precaution/Restrictions Comments: L hemipareisis, NPO, CorTrak Restrictions Weight Bearing Restrictions Per Provider Order: No  Pain:  No pain related this session.    Therapy/Group: Individual Therapy  Mliss DELENA Milliner PT, DPT, CSRS 01/20/2024, 6:19 PM

## 2024-01-20 NOTE — Patient Care Conference (Signed)
 Inpatient RehabilitationTeam Conference and Plan of Care Update Date: 01/20/2024   Time: 10:48 AM    Patient Name: Dustin Hamilton      Medical Record Number: [id number]  Date of Birth: 11-24-1940 Sex: Male         Room/Bed: 4W04C/4W04C-01 Payor Info: Payor: [employer] / Plan: [facility] / Product Type: *No Product type* /    Admit Date/Time:  01/04/2024  3:04 PM  Primary Diagnosis:  Stroke of right basal ganglia [facility])  Hospital Problems: Principal Problem:   Stroke of right basal ganglia [facility])    Expected Discharge Date: Expected Discharge Date:  (Pending DME)  Team Members Present: Physician leading conference: Dr. Murray Collier Social Worker Present: Rhoda Clement, LCSW Nurse Present: Barnie Ronde, RN PT Present: Recardo Milliner, PT OT Present: Katheryn Mines, OT SLP Present: Blaise Alderman, SLP PPS Coordinator present : Eleanor Colon, SLP     Current Status/Progress Goal Weekly Team Focus  Bowel/Bladder   Patient is incontinent of bowel and bladder. Condom catheter in use. Last bowel movement was a smear on 8/19. Bladder scanning every 4-6 hours. No signs of retention noted.   Continence of bowel and bladder   Assist patient with toileting every 4 hours to promote continence. Check for incontinence and clean promptly to prevent skin breakdown.    Swallow/Nutrition/ Hydration   NPO   mod  PO trials focusing on swallow initiation    ADL's   Continues at MAX +2 for ADLs overall, have been completing bed level ADLs to work on rolling in bed to decrease boudon of care // Barriers: improved alertness this past week with some improved task initiation, limited by functional cognition, poor awareness, tone  Downgraded to MAX A   task initiation, sitting balance, NMR, family education, following single step commands, familiar functional activities    Mobility   Continues at MaxA +2/ TotA for all mobility   Max/ TotA overall  Barriers: lethargy,  hemiplegia /// Work on: all mobility LOA and initiation, alertness, seated balance, NMR, family education    Communication   expressive/receptive language occasionally communicating simple information   mod   multimodal means of communication, yes/no, single step commands    Safety/Cognition/ Behavioral Observations  mod-severe attention deficits   mod   attention to tasks, participation in activities    Pain   No nonverbal pain cues noted. When asked if he is in pain he shakes his head no.   Will be free of pain   Assess pain every shift and as needed. Provide intervention if patient shows signs or reports pain.    Skin   New PEG site as of 8/19, Skin breakdown to sacrum. Gerhardt's butt cream and foam in place.   Will remain free of infection and PEG site with heal without complication. Wound healing to sacrum.  Assess skin every shift and as needed. Monitor PEG site and complete wound care per orders. Provide pressure relief as needed while in bed or chair. Check for incontinence to promote wound healing to sacrum.      Discharge Planning:  Wife is continuing to plan to take him home still getting caregivers in place and DME has not been delivered via [place] so will need to stay until DME delivered. Wife is aware of [facility] contract being 30 days and wants an extension here. Asked for hands on education with caregivers and has not been done yet. Still awaiting dietican orders to begin tube feedings.   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 Progress limited by limited alertness, tracking deficits, unable to move from midline, and unable to follow instructions.   Patient on target to meet rehab goals: no, currently needs max to total assist overall due to worsening left sided inattention with CVA. Neuro stimulator has helped with alertness. Goals for discharge set for max -  total assist overall.  *See Care Plan and progress notes for long and short-term goals.   Revisions to Treatment Plan:  PEG placement 01/19/24; bolus feeds initiated 01/20/24 15/7 therapy schedule   Teaching Needs: Safety, medications, PEG care/use, administration of meds/feedings via PEG, skin care, transfers, toileting, etc.  Current Barriers to Discharge: Decreased caregiver support, Home enviroment access/layout, and Incontinence  Possible Resolutions to Barriers: Family education     Medical Summary Current Status: RIght basal ganglia infarct, HTN, BPH, Fatigue,Dysphagia, AKI    Barriers to Discharge Comments: RIght basal ganglia infarct, HTN, BPH, Fatigue,Dysphagia, AKI Possible Resolutions to [employer] Focus: fatigue and decreased attention, monitor BP, continue ritatlin, recheck labs tomorrow   Continued Need for Acute Rehabilitation Level of Care: The patient requires daily medical management by a physician with specialized training in physical medicine and rehabilitation for the following reasons: Direction of a multidisciplinary physical rehabilitation program to maximize functional independence : Yes Medical management of patient stability for increased activity during participation in an intensive rehabilitation regime.: Yes Analysis of laboratory values and/or radiology reports with any subsequent need for medication adjustment and/or medical intervention. : Yes   I attest that I was present, lead the team conference, and concur with the assessment and plan of the team.   [name] 01/20/2024, 3:47 PM

## 2024-01-20 NOTE — Progress Notes (Signed)
 Referring Physician(s): Dr. Urbano  Supervising Physician: Vanice Revel  Patient Status:  [facility] - In-pt  Chief Complaint: Dysphagia  Subjective: Patient lying in bed.  Noncommunicative.  G-tube in place.  Being used without issue per family at bedside.   Allergies: Penicillins, Levetiracetam , Aricept  [donepezil ], Exelon [rivastigmine], and Viagra [sildenafil]  Medications: Prior to Admission medications   Medication Sig Start Date End Date Taking? Authorizing Provider  acetaminophen  (TYLENOL ) 325 MG tablet Place 2 tablets (650 mg total) into feeding tube 3 (three) times daily as needed for moderate pain (pain score 4-6). 01/20/24   Angiulli, Toribio PARAS, PA-C  allopurinol  (ZYLOPRIM ) 100 MG tablet Place 1 tablet (100 mg total) into feeding tube daily. 01/04/24 01/03/25  Gherghe, Costin M, MD  amLODipine  (NORVASC ) 5 MG tablet Place 1 tablet (5 mg total) into feeding tube daily. 01/04/24   Trixie Nilda HERO, MD  aspirin  81 MG chewable tablet Place 1 tablet (81 mg total) into feeding tube daily for 16 days. 01/05/24 01/21/24  Gherghe, Costin M, MD  bethanechol  (URECHOLINE ) 10 MG tablet Place 1 tablet (10 mg total) into feeding tube 3 (three) times daily. 01/04/24   Gherghe, Costin M, MD  brimonidine  (ALPHAGAN ) 0.2 % ophthalmic solution Place 1 drop into both eyes 2 (two) times daily. 10/19/22   Sridharan, Sriramkumar, MD  clopidogrel  (PLAVIX ) 75 MG tablet Place 1 tablet (75 mg total) into feeding tube daily. 01/20/24   Angiulli, Toribio PARAS, PA-C  colchicine  0.6 MG tablet Place 1 tablet (0.6 mg total) into feeding tube daily as needed. 01/04/24   Gherghe, Costin M, MD  dorzolamide  (TRUSOPT ) 2 % ophthalmic solution Place 1 drop into both eyes 2 (two) times daily.    [provider]  galantamine  (RAZADYNE ) 4 MG tablet Place 1 tablet (4 mg total) into feeding tube daily with breakfast. 01/20/24   Angiulli, Toribio PARAS, PA-C  galantamine  (RAZADYNE ) 8 MG tablet Take 8 mg by mouth daily. Takes along  with memantine     [provider]  latanoprost  (XALATAN ) 0.005 % ophthalmic solution Place 1 drop into both eyes at bedtime.    [provider]  melatonin 3 MG TABS tablet Take 3 mg by mouth at bedtime.    [provider]  melatonin 3 MG TABS tablet Place 1 tablet (3 mg total) into feeding tube at bedtime. 01/20/24   Angiulli, Toribio PARAS, PA-C  melatonin 3 MG TABS tablet Place 1 tablet (3 mg total) into feeding tube at bedtime. 01/20/24   Angiulli, Toribio PARAS, PA-C  memantine  (NAMENDA ) 10 MG tablet Take 10 mg by mouth 2 (two) times daily.    [provider]  memantine  (NAMENDA ) 10 MG tablet Place 1 tablet (10 mg total) into feeding tube 2 (two) times daily. 01/20/24   Angiulli, Toribio PARAS, PA-C  methylphenidate  (RITALIN ) 10 MG tablet Place 1 tablet (10 mg total) into feeding tube 2 (two) times daily with breakfast and lunch. 01/20/24   Angiulli, Toribio PARAS, PA-C  Multiple Vitamin (MULTIVITAMIN WITH MINERALS) TABS tablet Place 1 tablet into feeding tube daily. 01/20/24   Angiulli, Daniel J, PA-C  Multiple Vitamin (MULTIVITAMIN WITH MINERALS) TABS tablet Place 1 tablet into feeding tube daily. 01/20/24   Angiulli, Daniel J, PA-C  Nystatin (GERHARDT'S BUTT CREAM) CREA Apply 1 Application topically 2 (two) times daily. 01/20/24   Angiulli, Toribio PARAS, PA-C  tamsulosin  (FLOMAX ) 0.4 MG CAPS capsule Take 0.4 mg by mouth daily.    [provider]     Vital Signs:  BP 137/87 (BP Location: Left Arm)   Pulse 87   Temp 98.2 F (36.8 C) (Oral)   Resp 16   Ht 5' 11 (1.803 m)   Wt 121 lb 4.1 oz (55 kg)   SpO2 96%   BMI 16.91 kg/m   Physical Exam NAD, alert Abdomen: soft, non-tender.  G-tube in place.  Dried blood on gauze but no bleeding or active ooze.    Imaging: IR GASTROSTOMY TUBE MOD SED Result Date: 01/19/2024 INDICATION: 83 year old male with dysphagia and protein calorie malnutrition. He has a chronic indwelling enteric feeding tube and presents for placement of  a more durable percutaneous gastrostomy tube. EXAM: Fluoroscopically guided placement of percutaneous pull-through gastrostomy tube Interventional Radiologist:  Wilkie LOIS Lent, MD MEDICATIONS: 1 mg glucagon  administered intravenously by the Radiology nurse ANESTHESIA/SEDATION: Versed  1 mg IV; Fentanyl  50 mcg IV administered by the radiology nurse Moderate Sedation Time:  10 minutes The patient's vital signs and level of consciousness were continuously monitored during the procedure by the interventional radiology nurse under my direct supervision. CONTRAST:  25mL OMNIPAQUE  IOHEXOL  300 MG/ML  SOLN FLUOROSCOPY: Radiation exposure index: 7 mGy reference air kerma COMPLICATIONS: None immediate. PROCEDURE: Informed written consent was obtained from the patient after a thorough discussion of the procedural risks, benefits and alternatives. All questions were addressed. Maximal Sterile Barrier Technique was utilized including caps, mask, sterile gowns, sterile gloves, sterile drape, hand hygiene and skin antiseptic. A timeout was performed prior to the initiation of the procedure. Maximal barrier sterile technique utilized including caps, mask, sterile gowns, sterile gloves, large sterile drape, hand hygiene, and chlorhexadine skin prep. An angled catheter was advanced over a wire under fluoroscopic guidance through the nose, down the esophagus and into the body of the stomach. The stomach was then insufflated with several 100 ml of air. Fluoroscopy confirmed location of the gastric bubble, as well as inferior displacement of the barium stained colon. Under direct fluoroscopic guidance, a single T-tack was placed, and the anterior gastric wall drawn up against the anterior abdominal wall. Percutaneous access was then obtained into the mid gastric body with an 18 gauge sheath needle. Aspiration of air, and injection of contrast material under fluoroscopy confirmed needle placement. An Amplatz wire was advanced in the  gastric body and the access needle exchanged for a 9-French vascular sheath. A snare device was advanced through the vascular sheath and an Amplatz wire advanced through the angled catheter. The Amplatz wire was successfully snared and this was pulled up through the esophagus and out the mouth. A 20-French Suzen Gaskins MIC-PEG tube was then connected to the snare and pulled through the mouth, down the esophagus, into the stomach and out to the anterior abdominal wall. Hand injection of contrast material confirmed intragastric location. The T-tack retention suture was then cut. The pull through peg tube was then secured with the external bumper and capped. The patient will be observed for several hours with the newly placed tube on low wall suction to evaluate for any post procedure complication. The patient tolerated the procedure well, there is no immediate complication. IMPRESSION: Successful placement of a 20 French pull through gastrostomy tube. Electronically Signed   By: Wilkie Lent M.D.   On: 01/19/2024 09:34    Labs:  CBC: Recent Labs    01/07/24 0429 01/11/24 0457 01/14/24 0503 01/18/24 0616  WBC 8.3 5.6 7.0 5.6  HGB 15.4 15.2 15.5 13.9  HCT 46.4 45.2 47.6 42.1  PLT 143* 138* 179 213  COAGS: Recent Labs    03/14/23 1920 09/30/23 0020 12/30/23 1044  INR 1.1 1.1 1.2  APTT 25  --  31    BMP: Recent Labs    01/07/24 0844 01/11/24 0457 01/14/24 0503 01/18/24 0616  NA 140 140 140 139  K 4.7 4.4 4.4 4.0  CL 106 106 105 103  CO2 27 27 29 26   GLUCOSE 199* 178* 137* 138*  BUN 30* 39* 30* 32*  CALCIUM  9.9 9.2 9.7 9.3  CREATININE 0.89 1.01 0.79 0.83  GFRNONAA >60 >60 >60 >60    LIVER FUNCTION TESTS: Recent Labs    01/02/24 0258 01/02/24 0510 01/03/24 0329 01/05/24 0526  BILITOT 1.5* 1.4* 0.8 0.4  AST 33 34 30 78*  ALT 27 31 35 79*  ALKPHOS 61 63 55 57  PROT 6.8 7.1 6.2* 6.5  ALBUMIN 3.4* 3.5 3.0* 2.9*    Assessment and Plan: Dysphagia s/p  gastrostomy tube placement 8/19 Pull through gastrostomy tube in place without issue.  Ok to resume DAPT.  Currently using tube as ordered without issue.  No current needs in IR.    Electronically Signed: Arseniy Toomey Sue-Ellen Marketa Midkiff, PA 01/20/2024, 1:27 PM   I spent a total of 15 Minutes at the the patient's bedside AND on the patient's hospital floor or unit, greater than 50% of which was counseling/coordinating care for dysphagia.

## 2024-01-20 NOTE — Progress Notes (Signed)
 Speech Language Pathology Daily Session Note  Patient Details  Name: Dustin Hamilton MRN: [id number] Date of Birth: Feb 05, 1941  Today's Date: 01/20/2024 SLP Co-Treatment Time: 1100 (total time 11-12pm)-1130 SLP Co-Treatment Time Calculation (min): 30 min  Short Term Goals: Week 3: SLP Short Term Goal 1 (Week 3): STG = LTG due to ELOS  Skilled Therapeutic Interventions: Skilled therapy session was provided in conjuction with PT. SLP targeted communication throughout various physical tasks. Patient with delayed, though eventual appropriate verbal responses throughout session. Patient shared name and correct address as well as counted 1-10 given modA to increase vocal intensity. Patient with instances of perseveration re: prior conversation when asked current location and floor. Patient with improvements in attention to task and followed single step directions with mod verbal and tactile cues. Of note: patient with increased anterior sialorrhea likely due to sitting in an upright posture during tx. Patient benefitted from max verbal and tactile cues to initiate pharyngeal swallow of secretions. Patient left in bed with alarm set and call bell in reach. Wife present. Continue POC.   Pain None reported   Therapy/Group: Other: Co-Treat with PT  Geniene List M.A., CCC-SLP 01/20/2024, 7:33 AM

## 2024-01-20 NOTE — Progress Notes (Signed)
 Demonstrated how to administer medications and feedings in the tube with the wife. Also showed her how to flush the tubing. Wife verbalized and demonstrated understanding with teach back.

## 2024-01-21 LAB — CBC
HCT: 43.7 % (ref 39.0–52.0)
Hemoglobin: 14.7 g/dL (ref 13.0–17.0)
MCH: 31.5 pg (ref 26.0–34.0)
MCHC: 33.6 g/dL (ref 30.0–36.0)
MCV: 93.6 fL (ref 80.0–100.0)
Platelets: 221 K/uL (ref 150–400)
RBC: 4.67 MIL/uL (ref 4.22–5.81)
RDW: 13.2 % (ref 11.5–15.5)
WBC: 6.6 K/uL (ref 4.0–10.5)
nRBC: 0 % (ref 0.0–0.2)

## 2024-01-21 LAB — GLUCOSE, CAPILLARY
Glucose-Capillary: 103 mg/dL — ABNORMAL HIGH (ref 70–99)
Glucose-Capillary: 153 mg/dL — ABNORMAL HIGH (ref 70–99)
Glucose-Capillary: 157 mg/dL — ABNORMAL HIGH (ref 70–99)
Glucose-Capillary: 110 mg/dL — ABNORMAL HIGH (ref 70–99)

## 2024-01-21 LAB — BASIC METABOLIC PANEL WITH GFR
Anion gap: 11 (ref 5–15)
BUN: 29 mg/dL — ABNORMAL HIGH (ref 8–23)
CO2: 24 mmol/L (ref 22–32)
Calcium: 9.3 mg/dL (ref 8.9–10.3)
Chloride: 105 mmol/L (ref 98–111)
Creatinine, Ser: 0.8 mg/dL (ref 0.61–1.24)
GFR, Estimated: >60 mL/min (ref >60)
Glucose, Bld: 97 mg/dL (ref 70–99)
Potassium: 4.1 mmol/L (ref 3.5–5.1)
Sodium: 140 mmol/L (ref 135–145)

## 2024-01-21 NOTE — Progress Notes (Signed)
 Occupational Therapy Session Note  Patient Details  Name: Dustin Hamilton MRN: [id number] Date of Birth: 08-31-1940  Today's Date: 01/21/2024 OT Individual Time: 1119-1200 OT Individual Time Calculation (min): 41 min    Short Term Goals: Week 3:  OT Short Term Goal 1 (Week 3): STG=LTG d/t ELOS  Skilled Therapeutic Interventions/Progress Updates:     Pt received semi-reclined in bed, sleeping upon OT arrival however easy to wake. Pt presenting to be alert, maintaining eyes open throughout session. Pt reporting 0/10 pain- OT offering intermittent rest breaks, repositioning, and therapeutic support to optimize participation in therapy session. Focused this session on ADL retraining and functional cognition. Donned pants bed level to facilitate increased opportunities to work on rolling in bed- able to bed R LE and reach across midline to initiate rolling with MOD A. Pants donned total A. Pt transitioned to EOB with total A +2 MIN A. Sitting EOB engaged Pt in completing UB bathing to work on task initiation and attention- able to wash face following verbal prompt and wash L UE/chest with MOD HOH A. Facilitate crossing midline during activity with OT providing MAX HOH A to incorporate L UE into bathing tasks. Set-up shirt in preparation for dressing. Pt able to lift L UE to initiate weaving it following verbal cues and then weave R UE partially into sleeve of shirt requiring MAX A overall to complete UB dressing. Provided Pt with suction toothbrush and he was able to brush teeth with MOD A- assistance required to initiate bringing toothbrush to mouth and to wash L portion of teeth. Pt also biting down on toothbrush requiring MAX verbal cues to continue brushing. Pt returned to bed total A at end of session. Improved alertness, attention, and task initiation noted this session. Pt was left resting in air bed with call bell in reach, 4 bed rails up, and all needs met.    Therapy  Documentation Precautions:  Precautions Precautions: Fall Recall of Precautions/Restrictions: Impaired Precaution/Restrictions Comments: L hemipareisis, NPO, CorTrak Restrictions Weight Bearing Restrictions Per Provider Order: No   Therapy/Group: Individual Therapy  Katheryn SHAUNNA [place]ines 01/21/2024, 8:04 AM

## 2024-01-21 NOTE — Progress Notes (Signed)
 Physical Therapy Weekly Progress Note  Patient Details  Name: Dustin Hamilton MRN: [id number] Date of Birth: May 08, 1941  Beginning of progress report period: January 14, 2024 End of progress report period: January 22, 2024  Today's Date: 01/22/2024  Patient has met 1 of 4 short term goals.  Pt making very slow progress towards goals and requires add'l skilled therapy for attempt to progress. Lack of progress due to barriers from severity of stroke in addition to chronic comorbidities. He continues to require near-dependent physical assist to complete bed mobility and transfers d/t pt's consistent increased tone especially in trunk and neck. One hired caregiver has participated and wife has observed family education re: use of hoyer to transfer pt at home as well as use of w/c features including use of tilt to adjust pt's position for pressure relief throughout each day. Performed to prepare for d/c home.    Patient continues to demonstrate the following deficits muscle weakness and muscle joint tightness, decreased cardiorespiratoy endurance, abnormal tone, unbalanced muscle activation, decreased coordination, and decreased motor planning, decreased visual perceptual skills and field cut, decreased midline orientation and left side neglect, decreased initiation, decreased attention, decreased awareness, decreased problem solving, decreased safety awareness, and decreased memory, and decreased sitting balance, decreased standing balance, decreased postural control, and hemiplegia and therefore will continue to benefit from skilled PT intervention to increase functional independence with mobility.  Patient not progressing toward long term goals.  See goal revision..  Plan of care revisions: Goals downgraded d/t lack of progress.  PT Short Term Goals Week 2:  PT Short Term Goal 1 (Week 2): Pt will complete bed mobility with MaxA +1. PT Short Term Goal 1 - Progress (Week 2): Progressing toward goal PT  Short Term Goal 2 (Week 2): Pt will perform sit<>stand with MaxA +1. PT Short Term Goal 2 - Progress (Week 2): Progressing toward goal PT Short Term Goal 3 (Week 2): Pt will perform seat to seat pivot transfers with MaxA +2. PT Short Term Goal 3 - Progress (Week 2): Met PT Short Term Goal 4 (Week 2): Pt will maintain sitting balance with vc and self corrections and overall MinA for more than . PT Short Term Goal 4 - Progress (Week 2): Not met Week 3:  PT Short Term Goal 1 (Week 3): STG = LTG d/t ELOS  Skilled Therapeutic Interventions/Progress Updates:  Ambulation/gait training;DME/adaptive equipment instruction;Neuromuscular re-education;Psychosocial support;Stair training;UE/LE Strength taining/ROM;Wheelchair propulsion/positioning;Balance/vestibular training;Discharge planning;Functional electrical stimulation;Therapeutic Activities;UE/LE Coordination activities;Visual/perceptual remediation/compensation;Therapeutic Exercise;Splinting/orthotics;Patient/family education;Functional mobility training;Disease management/prevention;Cognitive remediation/compensation;Skin care/wound management   Therapy Documentation Precautions:  Precautions Precautions: Fall Recall of Precautions/Restrictions: Impaired Precaution/Restrictions Comments: L hemipareisis, NPO, new PEG placement Restrictions Weight Bearing Restrictions Per Provider Order: No  Pain:  No pain demonstrated in recent therapy sessions.   Therapy/Group: Individual Therapy  Mliss DELENA Milliner PT, DPT, CSRS 01/21/2024, 8:12 AM

## 2024-01-21 NOTE — Progress Notes (Signed)
 PROGRESS NOTE   Subjective/Complaints:  No new concerns or complaints this morning.  ROS- limited by cognition   Objective:   IR GASTROSTOMY TUBE MOD SED Result Date: 01/19/2024 INDICATION: 83 year old male with dysphagia and protein calorie malnutrition. He has a chronic indwelling enteric feeding tube and presents for placement of a more durable percutaneous gastrostomy tube. EXAM: Fluoroscopically guided placement of percutaneous pull-through gastrostomy tube Interventional Radiologist:  Wilkie LOIS Lent, MD MEDICATIONS: 1 mg glucagon  administered intravenously by the Radiology nurse ANESTHESIA/SEDATION: Versed  1 mg IV; Fentanyl  50 mcg IV administered by the radiology nurse Moderate Sedation Time:  10 minutes The patient's vital signs and level of consciousness were continuously monitored during the procedure by the interventional radiology nurse under my direct supervision. CONTRAST:  25mL OMNIPAQUE  IOHEXOL  300 MG/ML  SOLN FLUOROSCOPY: Radiation exposure index: 7 mGy reference air kerma COMPLICATIONS: None immediate. PROCEDURE: Informed written consent was obtained from the patient after a thorough discussion of the procedural risks, benefits and alternatives. All questions were addressed. Maximal Sterile Barrier Technique was utilized including caps, mask, sterile gowns, sterile gloves, sterile drape, hand hygiene and skin antiseptic. A timeout was performed prior to the initiation of the procedure. Maximal barrier sterile technique utilized including caps, mask, sterile gowns, sterile gloves, large sterile drape, hand hygiene, and chlorhexadine skin prep. An angled catheter was advanced over a wire under fluoroscopic guidance through the nose, down the esophagus and into the body of the stomach. The stomach was then insufflated with several 100 ml of air. Fluoroscopy confirmed location of the gastric bubble, as well as inferior  displacement of the barium stained colon. Under direct fluoroscopic guidance, a single T-tack was placed, and the anterior gastric wall drawn up against the anterior abdominal wall. Percutaneous access was then obtained into the mid gastric body with an 18 gauge sheath needle. Aspiration of air, and injection of contrast material under fluoroscopy confirmed needle placement. An Amplatz wire was advanced in the gastric body and the access needle exchanged for a 9-French vascular sheath. A snare device was advanced through the vascular sheath and an Amplatz wire advanced through the angled catheter. The Amplatz wire was successfully snared and this was pulled up through the esophagus and out the mouth. A 20-French Suzen Gaskins MIC-PEG tube was then connected to the snare and pulled through the mouth, down the esophagus, into the stomach and out to the anterior abdominal wall. Hand injection of contrast material confirmed intragastric location. The T-tack retention suture was then cut. The pull through peg tube was then secured with the external bumper and capped. The patient will be observed for several hours with the newly placed tube on low wall suction to evaluate for any post procedure complication. The patient tolerated the procedure well, there is no immediate complication. IMPRESSION: Successful placement of a 20 French pull through gastrostomy tube. Electronically Signed   By: Wilkie Lent M.D.   On: 01/19/2024 09:34    Recent Labs    01/21/24 0530  WBC 6.6  HGB 14.7  HCT 43.7  PLT 221     Recent Labs    01/21/24 0530  NA 140  K 4.1  CL 105  CO2 24  GLUCOSE 97  BUN 29*  CREATININE 0.80  CALCIUM  9.3      Intake/Output Summary (Last 24 hours) at 01/21/2024 0833 Last data filed at 01/21/2024 0605 Gross per 24 hour  Intake 660 ml  Output 1250 ml  Net -590 ml        Physical Exam: Vital Signs Blood pressure (!) 128/95, pulse 80, temperature 98.3 F (36.8 C), temperature  source Oral, resp. rate 16, height 5' 11 (1.803 m), weight 55 kg, SpO2 98%.  General: No acute distress, laying in bed, alert but drowsy ENT- cortrak in nares  Masked facies  Mood and affect flat, opens eyes on command, minimally interactive Heart: Regular rate and rhythm no rubs murmurs or extra sounds Lungs: Clear to auscultation bilaterally though poor inspiratory effort, nonlabored breathing Abdomen: Positive bowel sounds, soft nontender to palpation, nondistended PEG tube in place- looks ok  Extremities: No clubbing, cyanosis, or edema Skin: No evidence of breakdown, no evidence of rash over exposed surfaces Neuro: Drowsy , eyes open to voice and follows very simple command intermittently, requires a lot of encouragement for commands,  but still minimally interactive.   Neurologic: masked facies, eyes closed , no spontaneous movement .Sensory exam limited by attention and concentration  Cerebellar exam could not cooperate  Musculoskeletal: no pain with hip or knee ROM BLE  Prior neuro assessment is c/w today's exam 01/21/2024.  Prior exam  motor strength is 4/5 in right deltoid, bicep, tricep, grip, 2-/5 LUE and LLE- MMT varies based on LOA - lost focus during LE MMT with limited attempt despite cuing   Assessment/Plan: 1. Functional deficits which require 3+ hours per day of interdisciplinary therapy in a comprehensive inpatient rehab setting. Physiatrist is providing close team supervision and 24 hour management of active medical problems listed below. Physiatrist and rehab team continue to assess barriers to discharge/monitor patient progress toward functional and medical goals  Care Tool:  Bathing    Body parts bathed by patient: Face   Body parts bathed by helper: Left arm, Abdomen, Chest, Right arm, Right upper leg, Left upper leg     Bathing assist Assist Level: 2 Helpers     Upper Body Dressing/Undressing Upper body dressing   What is the patient wearing?: Pull  over shirt    Upper body assist Assist Level: 2 Helpers    Lower Body Dressing/Undressing Lower body dressing      What is the patient wearing?: Underwear/pull up, Pants     Lower body assist Assist for lower body dressing: 2 Helpers     Toileting Toileting    Toileting assist Assist for toileting: Dependent - Patient 0%     Transfers Chair/bed transfer  Transfers assist  Chair/bed transfer activity did not occur: Safety/medical concerns  Chair/bed transfer assist level: Total Assistance - Patient < 25%     Locomotion Ambulation   Ambulation assist   Ambulation activity did not occur: Safety/medical concerns  Assist level: 2 helpers Assistive device: Other (comment) (3 musketeer support) Max distance: 6 ft   Walk 10 feet activity   Assist  Walk 10 feet activity did not occur: Safety/medical concerns        Walk 50 feet activity   Assist Walk 50 feet with 2 turns activity did not occur: Safety/medical concerns         Walk 150 feet activity   Assist Walk 150 feet activity did not occur: Safety/medical concerns         Walk  10 feet on uneven surface  activity   Assist Walk 10 feet on uneven surfaces activity did not occur: Safety/medical concerns         Wheelchair     Assist Is the patient using a wheelchair?: Yes Type of Wheelchair: Manual Wheelchair activity did not occur: Safety/medical concerns  Wheelchair assist level: Dependent - Patient 0%      Wheelchair 50 feet with 2 turns activity    Assist    Wheelchair 50 feet with 2 turns activity did not occur: Safety/medical concerns   Assist Level: Dependent - Patient 0%   Wheelchair 150 feet activity     Assist  Wheelchair 150 feet activity did not occur: Safety/medical concerns   Assist Level: Dependent - Patient 0%   Blood pressure (!) 128/95, pulse 80, temperature 98.3 F (36.8 C), temperature source Oral, resp. rate 16, height 5' 11 (1.803 m),  weight 55 kg, SpO2 98%.  Medical Problem List and Plan: 1. 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  Will discuss further with team tomorrow team meeting -Team conference today please see physician documentation under team conference tab, met with team  to discuss problems,progress, and goals. Formulized individual treatment plan based on medical history, underlying problem and comorbidities.  -Pt still waiting on WC and wife working on getting daytime home care assistant  2.  Antithrombotics: -DVT/anticoagulation:  Pharmaceutical: Lovenox  40mg  daily             -antiplatelet therapy: ASA/Plavix  X 3 weeks followed by Plavix  alone Plavix  and aspirin  held for PEG-- Restart   3. Pain Management: Tylenol  prn.  4. Mood/Behavior/Sleep: LCSW to follow for evaluation and support.             --hx of sundowning-->continue melatonin 3 mg/Monitor sleep hygeine             -antipsychotic agents: N/A -Reduced level of alertness trial amantadine - no significant changes will trial methylphenidate  10mg  BID starting noon 8/11 -8/15 patient reported to be a little more alert working with OT this morning.  Continue current regimen -8/20 Continue methylphenidate  to 10 mg twice daily 5. Neuropsych/cognition: This patient is not capable of making decisions  6. Skin/Wound Care:  Routine pressure relief measures.  7. Fluids/Electrolytes/Nutrition: Continue NPO due to holding of orals.              --on tube feeds.  Appreciate dietitian assistance. 8. HTN: Monitor BP TID--goal 150 to avoid symptoms (multiple ED visits this year)             --hx  of chronic dizziness. Needs to stay hydrated.  -8/19-20 fair control, continue to monitor trend Vitals:   01/19/24 0845 01/19/24 0850 01/19/24 0855 01/19/24 0900  BP: 117/84 120/79 111/85 107/81   01/19/24 0905 01/19/24 0910 01/19/24 1625 01/19/24 1958  BP: (!) 151/97 (!) 148/94 (!) 134/94 (!) 156/98   01/20/24 0417 01/20/24 1554 01/20/24 2007 01/21/24 0439  BP: 137/87 (!) 133/93 (!) 140/91 (!) 128/95    9. H/o SVT/CHF s/p PPM/syncope: Monitor for orthostatic symptoms  10. BPH/urinary retention: Urecholine  and Proscar  cannot be crushed per pharmacy so will hold (cannot use Avodart  capsule due to TF).  -01/09/24 PVR documented once, 66; monitor; of note, getting urocholine 10mg  TID... -01/10/24 low PVRs but not many documented -01/17/24 poor documentation of PVRs, but using condom cath and output looks ok. Monitor.   -8/19 has not required recent IC, bladder scan volumes not particularly elevated.  8/20 bladder scan 0-230 continue to monitor  - 8/21 not requiring readjustment intermittent catheterization  11. 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 Glaucoma: Resume home gtts Alphagan , Trusopt , Xalatan . 13. Dysphagia: NPO with tube feeds. May need PEG for nutritional support. BUN up will increase free H20  - Patient has IR consult for PEG, CT scan completed  - PEG for tomorrow scheduled.  Tube feeds and free water  held at midnight.  - 8/19 PEG placed today, IR indicates diet may be advanced tomorrow.  Will start gentle IVF NS for 24h   --8/20 Tube feeding starting today, Will not continue IVF after this afternoon   -8/21 continue tube feedings with PEG 14. Acute renal failure: Improved from 24/1.36 to 27/0.99>> 30/0.89  - 8/14 improved at creatinine 0.79/BUN 30  -8/18 BUN/creatinine stable at 32/0.83   -8/20 Free water  restarted by PEG today   - 8/21 BUN/creatinine stable 15. Chronic Thrombocytopenia:  stable/improving    -8/21 stable 221    Latest Ref Rng & Units 01/21/2024    5:30 AM 01/18/2024    6:16 AM 01/14/2024    5:03 AM  CBC  WBC 4.0 - 10.5 K/uL 6.6  5.6  7.0   Hemoglobin 13.0 - 17.0 g/dL 85.2  86.0  84.4   Hematocrit 39.0 - 52.0 % 43.7  42.1  47.6   Platelets 150 - 400 K/uL 221  213  179      16.  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  UA + WBC but only rare bact, treat empirically keflex  500mg  BID x 3 d to see if this improves MS -No change in cognition noted  Urine blood tinged yesterday , monitor Hgb ok  Hgb is normal , mildly low plt also on asa , plavix  and enoxaparin , will monitor  May need to hold enoxaparin   8/15 urine recorded is yellow/straw, continue to monitor  18. Hx of gout: allopurinol  100mg  daily   LOS: 17 days A FACE TO FACE EVALUATION WAS PERFORMED  Murray Collier 01/21/2024, 8:33 AM

## 2024-01-21 NOTE — Progress Notes (Signed)
 Patient ID: Dustin Hamilton, male   DOB: 05/30/1941, 83 y.o.   MRN: [id number]  [facility] working on [facility] feed[facility] delivery unsure when this will be. Still has not received the TIS wheelchair and wife still working on daytime caregiver. Wife continues to feel he is not ready to go home and is more alert now. [facility] is a [facility] day [facility] and an appeal can not be done, rather the MD will need to discuss no longer medically needs to be here and functional progress has stopped.

## 2024-01-21 NOTE — Progress Notes (Signed)
 Physical Therapy Session Note  Patient Details  Name: Dustin Hamilton MRN: [id number] Date of Birth: December 29, 1940  Today's Date: 01/21/2024 PT Individual Time: 1015-1100 PT Individual Time Calculation (min): 45 min   Short Term Goals:  Skilled Therapeutic Interventions/Progress Updates:   Pt received supine in bed, agreeable to therapy with thumbs up sign. Pt reported no pain at this time. Per order, orthostatics attempted. Vitals listed below. Pt presented w/ BP of 77/63 (69) 60bpm in supine. BP attempted again to verify, 85/63. RN consulted, decision to don ted hose was made before attempting to sit up. RN present and BP taken again: 93/72 (79) 85bpm.  Pt performed supine to sit w/ total A +2 for safety. BP taken immediately upon sitting (96/74 92bpm) and 2 minutes later (87/70 90bpm). Pt largely nonverbal, but able to confirm he was doing okay. Sit to stand attempted, pt able to come to partial stand w/ total A +2, BP taken but unreliable due to pressure of SPT arm just above pt BP cuff. Pt returned to sitting w/ total A +2, then sit to supine with same assist.   Supine w/o teds: 77/63 (69) 60bpm       85/63 (71) 86bpm Supine w/ teds: 93/72 (79) 85bpm Seated: 96/74 (83) 92bpm Seated After : 87/70 (77) 90bpm  At end of session, pt given call bell and all other needs in reach.   Therapy Documentation Precautions:  Precautions Precautions: Fall Recall of Precautions/Restrictions: Impaired Precaution/Restrictions Comments: L hemipareisis, NPO, CorTrak Restrictions Weight Bearing Restrictions Per Provider Order: No   Therapy/Group: Individual Therapy  Oneil Grumbles 01/21/2024, 4:56 PM

## 2024-01-21 NOTE — Progress Notes (Signed)
 Physical Therapy Session Note  Patient Details  Name: [name] MRN: [id number] Date of Birth: 03/16/1941  Today's Date: 01/21/2024 PT Individual Time: 1450-1531 PT Individual Time Calculation (min): 41 min   Short Term Goals: Week 2:  PT Short Term Goal 1 (Week 2): Pt will complete bed mobility with MaxA +1. PT Short Term Goal 2 (Week 2): Pt will perform sit<>stand with MaxA +1. PT Short Term Goal 3 (Week 2): Pt will perform seat to seat pivot transfers with MaxA +2. PT Short Term Goal 4 (Week 2): Pt will maintain sitting balance with vc and self corrections and overall MinA for more than .  Skilled Therapeutic Interventions/Progress Updates:    Pt presents in room in bed, alert and agreeable to PT. Pt able to verbalize no pain and feeling good. Session focused on therapeutic activities to facilitate participation with bed mobility, transfer training, and following commands to promote decreased caregiver burden at DC. Pt completes bed mobility with total assist x2, requires max assist for sitting balance. Pt then completes squat pivot to L with max assist x2. Pt transported to day room and therapist facilitates pt participation with dance group with therapist providing skilled verbal/tactile cues and manual facilitation to promote improved command following with RUE/RLE movements. Pt noted to have spontaneous foot tapping on RLE with cues, able to utilize RUE for tapping lap, requires max cues and mod manual facilitation for RUE movements. Pt returns to room and completes squat pivot max assist x2 back to bed, completes sit to supine with max cues for sequencing sit to R elbow, pt unable to come to sidelying on R elbow despite max cues, requires max assist x2 for trunk management and BLEs into bed. Pt remains with HOB elevated >30* with all needs within reach, cal light in place and positioned with pillows for comfort and offloading at end of session.   Therapy  Documentation Precautions:  Precautions Precautions: Fall Recall of Precautions/Restrictions: Impaired Precaution/Restrictions Comments: L hemipareisis, NPO, CorTrak Restrictions Weight Bearing Restrictions Per Provider Order: No    Therapy/Group: Individual Therapy  Reche Ohara PT, DPT 01/21/2024, 4:16 PM

## 2024-01-21 NOTE — Progress Notes (Signed)
 Patient unable to sit on side of bed and stand for orthostatic vital signs.

## 2024-01-22 LAB — GLUCOSE, CAPILLARY
Glucose-Capillary: 107 mg/dL — ABNORMAL HIGH (ref 70–99)
Glucose-Capillary: 95 mg/dL (ref 70–99)
Glucose-Capillary: 206 mg/dL — ABNORMAL HIGH (ref 70–99)
Glucose-Capillary: 126 mg/dL — ABNORMAL HIGH (ref 70–99)

## 2024-01-22 NOTE — Progress Notes (Addendum)
 PROGRESS NOTE   Subjective/Complaints:  A little more alert this morning than I have seen and previously.  No new complaints elicited.  ROS- limited by cognition   Objective:   No results found.   Recent Labs    01/21/24 0530  WBC 6.6  HGB 14.7  HCT 43.7  PLT 221     Recent Labs    01/21/24 0530  NA 140  K 4.1  CL 105  CO2 24  GLUCOSE 97  BUN 29*  CREATININE 0.80  CALCIUM  9.3      Intake/Output Summary (Last 24 hours) at 01/22/2024 1646 Last data filed at 01/22/2024 1501 Gross per 24 hour  Intake 800 ml  Output 900 ml  Net -100 ml        Physical Exam: Vital Signs Blood pressure 108/81, pulse (!) 106, temperature (!) 97.5 F (36.4 C), temperature source Axillary, resp. rate 18, height 5' 11 (1.803 m), weight 55 kg, SpO2 97%.  General: No acute distress, laying in bed, awake ENT- cortrak in nares has been removed Masked facies  Mood and affect flat, opens eyes on command, minimally interactive Heart: Regular rate and rhythm no rubs murmurs or extra sounds Lungs: Clear to auscultation bilaterally though poor inspiratory effort, nonlabored breathing Abdomen: Positive bowel sounds, soft nontender to palpation, nondistended PEG tube in place- looks ok, no tenderness noted around PEG tube site Extremities: No clubbing, cyanosis, or edema Skin: No breakdown noted over visible surfaces Neuro: More alert this morning, still very delayed responses, eyes open to voice and follows very simple command intermittently, requires a lot of encouragement for commands, still minimally interactive.   Neurologic: masked facies, eyes closed , no spontaneous movement .Sensory exam limited by attention and concentration   Musculoskeletal: no pain with hip or knee ROM BLE  Prior neuro assessment is c/w today's exam 01/22/2024.  Prior exam  motor strength is 4/5 in right deltoid, bicep, tricep, grip, 2-/5 LUE and  LLE- MMT varies based on LOA - lost focus during LE MMT with limited attempt despite cuing   Assessment/Plan: 1. Functional deficits which require 3+ hours per day of interdisciplinary therapy in a comprehensive inpatient rehab setting. Physiatrist is providing close team supervision and 24 hour management of active medical problems listed below. Physiatrist and rehab team continue to assess barriers to discharge/monitor patient progress toward functional and medical goals  Care Tool:  Bathing    Body parts bathed by patient: Face   Body parts bathed by helper: Left arm, Abdomen, Chest, Right arm, Right upper leg, Left upper leg     Bathing assist Assist Level: 2 Helpers     Upper Body Dressing/Undressing Upper body dressing   What is the patient wearing?: Pull over shirt    Upper body assist Assist Level: 2 Helpers    Lower Body Dressing/Undressing Lower body dressing      What is the patient wearing?: Underwear/pull up, Pants     Lower body assist Assist for lower body dressing: 2 Helpers     Toileting Toileting    Toileting assist Assist for toileting: Dependent - Patient 0%     Transfers Chair/bed transfer  Transfers assist  Chair/bed transfer activity did not occur: Safety/medical concerns  Chair/bed transfer assist level: Total Assistance - Patient < 25%     Locomotion Ambulation   Ambulation assist   Ambulation activity did not occur: Safety/medical concerns  Assist level: 2 helpers Assistive device: Other (comment) (3 musketeer support) Max distance: 6 ft   Walk 10 feet activity   Assist  Walk 10 feet activity did not occur: Safety/medical concerns        Walk 50 feet activity   Assist Walk 50 feet with 2 turns activity did not occur: Safety/medical concerns         Walk 150 feet activity   Assist Walk 150 feet activity did not occur: Safety/medical concerns         Walk 10 feet on uneven surface  activity   Assist  Walk 10 feet on uneven surfaces activity did not occur: Safety/medical concerns         Wheelchair     Assist Is the patient using a wheelchair?: Yes Type of Wheelchair: Manual Wheelchair activity did not occur: Safety/medical concerns  Wheelchair assist level: Dependent - Patient 0%      Wheelchair 50 feet with 2 turns activity    Assist    Wheelchair 50 feet with 2 turns activity did not occur: Safety/medical concerns   Assist Level: Dependent - Patient 0%   Wheelchair 150 feet activity     Assist  Wheelchair 150 feet activity did not occur: Safety/medical concerns   Assist Level: Dependent - Patient 0%   Blood pressure 108/81, pulse (!) 106, temperature (!) 97.5 F (36.4 C), temperature source Axillary, resp. rate 18, height 5' 11 (1.803 m), weight 55 kg, SpO2 97%.  Medical Problem List and Plan: 1. 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  Will discuss further with team tomorrow team meeting -Team conference today please see physician documentation under team conference tab, met with team  to discuss problems,progress, and goals. Formulized individual treatment plan based on medical history, underlying problem and comorbidities.  - Patient's wife is still waiting on Tis wheelchair and tube feeding supplies.  She is also still working on getting the daytime caregiver. 2.  Antithrombotics: -DVT/anticoagulation:  Pharmaceutical: Lovenox  40mg  daily             -antiplatelet therapy: ASA/Plavix  X 3 weeks followed by Plavix  alone Plavix  and aspirin  held for PEG-- Restart   3. Pain Management:  Tylenol  prn.  4. Mood/Behavior/Sleep: LCSW to follow for evaluation and support.             --hx of sundowning-->continue melatonin 3 mg/Monitor sleep hygeine             -antipsychotic agents: N/A -Reduced level of alertness trial amantadine - no significant changes will trial methylphenidate  10mg  BID starting noon 8/11 -8/15 patient reported to be a little more alert working with OT this morning.  Continue current regimen -8/20 Continue methylphenidate  10 mg twice daily 5. Neuropsych/cognition: This patient is not capable of making decisions  6. Skin/Wound Care:  Routine pressure relief  measures.  7. Fluids/Electrolytes/Nutrition: Continue NPO due to holding of orals.              --on tube feeds.  Appreciate dietitian assistance. 8. HTN: Monitor BP TID--goal 150 to avoid symptoms (multiple ED visits this year)             --hx of chronic dizziness. Needs to stay hydrated.  -8/22 well-controlled overall continue current regimen Vitals:   01/19/24 1625 01/19/24 1958 01/20/24 0417 01/20/24 1554  BP: (!) 134/94 (!) 156/98 137/87 (!) 133/93   01/20/24 2007 01/21/24 0439 01/21/24 1000 01/21/24 1236  BP: (!) 140/91 (!) 128/95 (!) 86/69 103/77   01/21/24 1345 01/21/24 1951 01/22/24 0425 01/22/24 1504  BP: 109/67 128/72 131/89 108/81    9. H/o SVT/CHF s/p PPM/syncope: Monitor for orthostatic symptoms   10. BPH/urinary retention: Urecholine  and Proscar  cannot be crushed per pharmacy so will hold (cannot use Avodart  capsule due to TF).  -01/09/24 PVR documented once, 66; monitor; of note, getting urocholine 10mg  TID... -01/10/24 low PVRs but not many documented -01/17/24 poor documentation of PVRs, but using condom cath and output looks ok. Monitor.   -8/19 has not required recent IC, bladder scan volumes not particularly elevated.  8/20 bladder scan 0-230 continue to monitor  - 8/22 incontinence present but not having retention requiring IC  11. 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 Glaucoma: Resume home gtts Alphagan , Trusopt , Xalatan . 13. Dysphagia: NPO with tube feeds. May need PEG for nutritional support. BUN up will increase free H20  - Patient has IR consult for PEG, CT scan completed  - PEG for tomorrow scheduled.  Tube feeds and free water  held at midnight.  - 8/19 PEG placed today, IR indicates diet may be advanced tomorrow.  Will start gentle IVF NS for 24h   --8/20 Tube feeding starting today, Will not continue IVF after this afternoon   -8/21 continue tube feedings with PEG  8/22 patient's wife is still working on getting home tube feeding formula delivered 14. Acute renal failure: Improved from 24/1.36 to 27/0.99>> 30/0.89  - 8/14 improved at creatinine 0.79/BUN 30  -8/18 BUN/creatinine stable at 32/0.83   -8/20 Free water  restarted by PEG today   - 8/21 BUN/creatinine stable  Recheck for Monday ordered 15. Chronic Thrombocytopenia: stable/improving    -8/21 stable 221    Latest Ref Rng & Units 01/21/2024    5:30 AM 01/18/2024    6:16 AM 01/14/2024    5:03 AM  CBC  WBC 4.0 - 10.5 K/uL 6.6  5.6  7.0   Hemoglobin 13.0 - 17.0 g/dL 85.2  86.0  84.4   Hematocrit 39.0 - 52.0 % 43.7  42.1  47.6   Platelets 150 - 400 K/uL 221  213  179      16.  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  UA + WBC but only rare bact, treat empirically keflex  500mg  BID x 3 d to see if this improves MS -No change in cognition noted  Urine blood tinged yesterday , monitor Hgb ok  Hgb is normal , mildly low plt also on asa , plavix  and enoxaparin , will monitor  May need to hold enoxaparin   8/15 urine recorded is yellow/straw, continue to monitor  18. Hx of gout: allopurinol  100mg  daily   LOS: 18 days A FACE TO FACE EVALUATION WAS  PERFORMED  Murray Collier 01/22/2024, 4:46 PM

## 2024-01-22 NOTE — Progress Notes (Signed)
 Speech Language Pathology Daily Session Note  Patient Details  Name: Dustin Hamilton MRN: [id number] Date of Birth: [birth year]  Today's Date: 01/22/2024 SLP Individual Time: 8883-8841 SLP Individual Time Calculation (min): 42 min  Short Term Goals: Week 3: SLP Short Term Goal 1 (Week 3): STG = LTG due to ELOS  Skilled Therapeutic Interventions: Skilled therapy session focused on dysphagia and communication goals. Patient with limited attempts to verbalize this date - stating name, but with no participation in further automatic speech tasks (counting, prefered music). SLP targeted dysphagia by attempting expiratory strengthening task. Despite max A, patient with no attempts to demonstrate forced exhalation. SLP provided suction oral care and offered ice chips. Patient with intermittent coughing before/after PO likely due to aspiration of posterior spillage and secretions. PO d/c due to extensive coughing. Patient would benefit from instrumental evaluation before discharge due to improved alertness to assess pharyngeal swallow. Continue NPO w/ use of PEG. Patient left in bed with alarm set and call bell in reach. Continue POC  Pain None reported   Therapy/Group: Individual Therapy  Treon Kehl M.A., CCC-SLP 01/22/2024, 7:45 AM

## 2024-01-22 NOTE — Progress Notes (Signed)
 Patient ID: Dustin Hamilton, male   DOB: July 23, 1940, 82 y.o.   MRN: [id number] Spoke with wife via telephone still waiting on TIS wheelchair and formula for tube feedings. She is till getting her day time caregiver in place. One is here and interested in being educated on pt's care. Check on Monday wife is aware once all in place he is ready for discharge.

## 2024-01-22 NOTE — Progress Notes (Signed)
 Occupational Therapy Session Note  Patient Details  Name: Dustin Hamilton MRN: [id number] Date of Birth: [birth year]  Today's Date: 01/22/2024 OT Individual Time: 1400-1429 OT Individual Time Calculation (min): 29 min    Short Term Goals: Week 3:  OT Short Term Goal 1 (Week 3): STG=LTG d/t ELOS  Skilled Therapeutic Interventions/Progress Updates:     Pt received semi-reclined in bed with personal caregiver present in room upon OT arrival. Pt presenting to be alert, receptive to skilled OT session reporting 0/10 pain- OT offering intermittent rest breaks, repositioning, and therapeutic support to optimize participation in therapy session. Pt able to state his full name, however not oriented to location or situation with gentle education provided. Spent time at beginning of session providing caregiver education on skin breakdown prevention, pressure relief, techniques for completing bed level ADLs, bed positioning, and techniques for protecting hemiparetic U/LEs during transfers and bed mobility. [name] caregiver receptive to education. Transitioned Pt to EOB MAX x2. MAX A required to maintain sitting balance EOB. Engaged Pt in completing UB bathing and dressing tasks to work on functional cognition and task initiation. He was able to participate by washing his face and L UE following verbal cues and MIN HOH A to initiate. MAX HOHA required to utilize L UE functional during bathing tasks. When donning shirt, Pt able to participate by pushing B UEs through sleeves following verbal/tactile cues. Shirt brought over head MAX A. Pt returned to supine at end of session with MAX x2. Improved attention and task initiation noted this session. Pt was left resting in bed with call bell in reach, bed alarm on, and all needs met.    Therapy Documentation Precautions:  Precautions Precautions: Fall Recall of Precautions/Restrictions: Impaired Precaution/Restrictions Comments: L hemipareisis, NPO,  CorTrak Restrictions Weight Bearing Restrictions Per Provider Order: No   Therapy/Group: Individual Therapy  Katheryn SHAUNNA [place] 01/22/2024, 12:59 PM

## 2024-01-22 NOTE — Plan of Care (Signed)
  Problem: Consults Goal: RH STROKE PATIENT EDUCATION Description: See Patient Education module for education specifics  Outcome: Progressing   Problem: RH BOWEL ELIMINATION Goal: RH STG MANAGE BOWEL WITH ASSISTANCE Description: STG Manage Bowel with toileting min Assistance. Outcome: Progressing Goal: RH STG MANAGE BOWEL W/MEDICATION W/ASSISTANCE Description: STG Manage Bowel with Medication with mod I Assistance. Outcome: Progressing   Problem: RH BLADDER ELIMINATION Goal: RH STG MANAGE BLADDER WITH ASSISTANCE Description: STG Manage Bladder With min Assistance Outcome: Progressing Goal: RH STG MANAGE BLADDER WITH MEDICATION WITH ASSISTANCE Description: STG Manage Bladder With Medication With mod I Assistance. Outcome: Progressing   Problem: RH SAFETY Goal: RH STG ADHERE TO SAFETY PRECAUTIONS W/ASSISTANCE/DEVICE Description: STG Adhere to Safety Precautions With cues Assistance/Device. Outcome: Progressing   Problem: RH COGNITION-NURSING Goal: RH STG USES MEMORY AIDS/STRATEGIES W/ASSIST TO PROBLEM SOLVE Description: STG Uses Memory Aids/Strategies With min Assistance to Problem Solve. Outcome: Progressing   Problem: RH PAIN MANAGEMENT Goal: RH STG PAIN MANAGED AT OR BELOW [name] PAIN GOAL Description: Pain < 4 with prns Outcome: Progressing   Problem: RH KNOWLEDGE DEFICIT Goal: RH STG INCREASE KNOWLEDGE OF HYPERTENSION Description: Patient and wife will be able to manage HTN using educational resources for medications and dietary modification independently Outcome: Progressing Goal: RH STG INCREASE KNOWLEDGE OF DYSPHAGIA/FLUID INTAKE Description: Patient and wife will be able to manage dysphagia using educational resources for medications and dietary modification independentl Outcome: Progressing Goal: RH STG INCREASE KNOWLEDGE OF STROKE PROPHYLAXIS Description: Patient and wife will be able to manage secondary risks using educational resources for medications and dietary  modification independentl Outcome: Progressing

## 2024-01-22 NOTE — Progress Notes (Addendum)
 Physical Therapy Session Note  Patient Details  Name: Dustin Hamilton MRN: [id number] Date of Birth: 1941/01/24  Today's Date: 01/22/2024 PT Individual Time: 1015-1115 PT Individual Time Calculation (min): 60 min   Short Term Goals: Week 1:  PT Short Term Goal 1 (Week 1): Pt will perform bed rolling with MaxA +1. PT Short Term Goal 1 - Progress (Week 1): Not met PT Short Term Goal 2 (Week 1): Pt will perform supine<>sit with MaxA +1. PT Short Term Goal 2 - Progress (Week 1): Not met PT Short Term Goal 3 (Week 1): Pt will perform sit<>stand with MaxA +2. PT Short Term Goal 3 - Progress (Week 1): Met PT Short Term Goal 4 (Week 1): Pt will maintain unsupported seated balance with MinA for more than 3 min. PT Short Term Goal 4 - Progress (Week 1): Progressing toward goal PT Short Term Goal 5 (Week 1): Pt will demonstrate improved initiation by following more than 50% of verbal instructions for mobility. PT Short Term Goal 5 - Progress (Week 1): Progressing toward goal Week 2:  PT Short Term Goal 1 (Week 2): Pt will complete bed mobility with MaxA +1. PT Short Term Goal 2 (Week 2): Pt will perform sit<>stand with MaxA +1. PT Short Term Goal 3 (Week 2): Pt will perform seat to seat pivot transfers with MaxA +2. PT Short Term Goal 4 (Week 2): Pt will maintain sitting balance with vc and self corrections and overall MinA for more than . Week 3:     Skilled Therapeutic Interventions/Progress Updates:    Today's treatment focused on improvement of  PROM of all extremities and positional tolerance. This was accomplished using the exercises and transfers listed below. Pt showed fair tolerance to administered treatment with no adverse effects by the end of session. Skilled intervention was utilized via activity modification for pt tolerance with task completion, functional progression/regression promoting best outcomes inline with current rehab goals, as well as moderate verbal/tactile cuing.    Alarm was set, call bell and all necessities were in reach. During positional tolerance vitals were taken.   Baseline 30d:  BP: 97/67 mmHg HR: 94 BPM SpO2: 94%  90d:  BP: 80/70 mmHg HR: 96 BPM SpO2: 96%  90d after 3 minutes BP: 86/40mmHg HR: 96 BPM SpO2: 96%  Upon exit at 30d:  BP: 102/69 mmHg HR: 92 BPM SpO2: 96%  Exercises: - Rolling L/R use of R hand on railing to pull, required limb placement. Max-total assist - PROM of all extremities, pt exhibited 2- L hip flexion strength at 90d. Unsure if this was reflexive or volitional. - EOB sitting, total assist - positional tolerance at 30, 50,70, 90d in fowlers  Transfers: Supine to EOB, Total assistance x1  Therapy Documentation Precautions:  Precautions Precautions: Fall Recall of Precautions/Restrictions: Impaired Precaution/Restrictions Comments: L hemipareisis, NPO, CorTrak Restrictions Weight Bearing Restrictions Per Provider Order: No  Pain: Pain Assessment Pain Scale: 0-10 Pain Score: 0-No pain Patients Stated Pain Goal: 0    Therapy/Group: Individual Therapy  Mabel Kiang, PT, DPT 01/22/2024, 1:11 PM

## 2024-01-23 ENCOUNTER — Other Ambulatory Visit (HOSPITAL_COMMUNITY): Payer: Self-pay

## 2024-01-23 LAB — GLUCOSE, CAPILLARY
Glucose-Capillary: 85 mg/dL (ref 70–99)
Glucose-Capillary: 136 mg/dL — ABNORMAL HIGH (ref 70–99)
Glucose-Capillary: 118 mg/dL — ABNORMAL HIGH (ref 70–99)
Glucose-Capillary: 119 mg/dL — ABNORMAL HIGH (ref 70–99)

## 2024-01-23 NOTE — Progress Notes (Signed)
 PROGRESS NOTE   Subjective/Complaints:  Pt doing ok, more interactive today. Says yes when asked if he slept well, and sleep chart shows he slept most of the night. Denies pain. Denies any other complaints.   ROS- limited by cognition but specifically denies CP, SOB, abd pain, n/v  Objective:   No results found.   Recent Labs    01/21/24 0530  WBC 6.6  HGB 14.7  HCT 43.7  PLT 221     Recent Labs    01/21/24 0530  NA 140  K 4.1  CL 105  CO2 24  GLUCOSE 97  BUN 29*  CREATININE 0.80  CALCIUM  9.3      Intake/Output Summary (Last 24 hours) at 01/23/2024 1149 Last data filed at 01/23/2024 0944 Gross per 24 hour  Intake 1748 ml  Output 3050 ml  Net -1302 ml        Physical Exam: Vital Signs Blood pressure 101/75, pulse 77, temperature 97.9 F (36.6 C), resp. rate 19, height 5' 11 (1.803 m), weight 55 kg, SpO2 95%.  General: No acute distress, laying in bed, awake ENT- cortrak in nares has been removed Masked facies  Mood and affect flat, opens eyes on command, more interactive today but still fairly minimal Heart: Regular rate and rhythm no rubs murmurs or extra sounds Lungs: Clear to auscultation bilaterally though poor inspiratory effort, nonlabored breathing Abdomen: Positive bowel sounds, soft nontender to palpation, nondistended PEG tube in place- looks ok, no tenderness noted around PEG tube site Extremities: No clubbing, cyanosis, or edema, TEDs donned Skin: No breakdown noted over visible surfaces Neuro: More alert this morning, still delayed responses, eyes open to voice and follows very simple command intermittently, requires some encouragement for commands, still fairly minimally interactive.   PRIOR EXAMS:  Neurologic: masked facies, eyes closed , no spontaneous movement .Sensory exam limited by attention and concentration   Musculoskeletal: no pain with hip or knee ROM BLE   Prior  exam  motor strength is 4/5 in right deltoid, bicep, tricep, grip, 2-/5 LUE and LLE- MMT varies based on LOA - lost focus during LE MMT with limited attempt despite cuing   Assessment/Plan: 1. Functional deficits which require 3+ hours per day of interdisciplinary therapy in a comprehensive inpatient rehab setting. Physiatrist is providing close team supervision and 24 hour management of active medical problems listed below. Physiatrist and rehab team continue to assess barriers to discharge/monitor patient progress toward functional and medical goals  Care Tool:  Bathing    Body parts bathed by patient: Face   Body parts bathed by helper: Left arm, Abdomen, Chest, Right arm, Right upper leg, Left upper leg     Bathing assist Assist Level: 2 Helpers     Upper Body Dressing/Undressing Upper body dressing   What is the patient wearing?: Pull over shirt    Upper body assist Assist Level: 2 Helpers    Lower Body Dressing/Undressing Lower body dressing      What is the patient wearing?: Underwear/pull up, Pants     Lower body assist Assist for lower body dressing: 2 Helpers     Toileting Toileting    Toileting assist Assist  for toileting: Dependent - Patient 0%     Transfers Chair/bed transfer  Transfers assist  Chair/bed transfer activity did not occur: Safety/medical concerns  Chair/bed transfer assist level: Total Assistance - Patient < 25%     Locomotion Ambulation   Ambulation assist   Ambulation activity did not occur: Safety/medical concerns  Assist level: 2 helpers Assistive device: Other (comment) (3 musketeer support) Max distance: 6 ft   Walk 10 feet activity   Assist  Walk 10 feet activity did not occur: Safety/medical concerns        Walk 50 feet activity   Assist Walk 50 feet with 2 turns activity did not occur: Safety/medical concerns         Walk 150 feet activity   Assist Walk 150 feet activity did not occur:  Safety/medical concerns         Walk 10 feet on uneven surface  activity   Assist Walk 10 feet on uneven surfaces activity did not occur: Safety/medical concerns         Wheelchair     Assist Is the patient using a wheelchair?: Yes Type of Wheelchair: Manual Wheelchair activity did not occur: Safety/medical concerns  Wheelchair assist level: Dependent - Patient 0%      Wheelchair 50 feet with 2 turns activity    Assist    Wheelchair 50 feet with 2 turns activity did not occur: Safety/medical concerns   Assist Level: Dependent - Patient 0%   Wheelchair 150 feet activity     Assist  Wheelchair 150 feet activity did not occur: Safety/medical concerns   Assist Level: Dependent - Patient 0%   Blood pressure 101/75, pulse 77, temperature 97.9 F (36.6 C), resp. rate 19, height 5' 11 (1.803 m), weight 55 kg, SpO2 95%.  Medical Problem List and Plan: 1. 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  Will discuss further with team tomorrow team meeting -Team conference today please see physician documentation under team conference tab, met with team  to discuss problems,progress, and goals. Formulized individual treatment plan based on medical history, underlying problem and comorbidities.  - Patient's wife is still waiting on Tis wheelchair and tube feeding supplies.  She is also still working on getting the daytime caregiver. 2.  Antithrombotics: -DVT/anticoagulation:  Pharmaceutical: Lovenox  40mg  daily             -antiplatelet therapy: ASA/Plavix  X 3 weeks followed by Plavix  alone Plavix  and  aspirin  held for PEG-- Restart   3. Pain Management: Tylenol  prn.  4. Mood/Behavior/Sleep: LCSW to follow for evaluation and support.             --hx of sundowning-->continue melatonin 3 mg/Monitor sleep hygeine             -antipsychotic agents: N/A -Reduced level of alertness trial amantadine - no significant changes will trial methylphenidate  10mg  BID starting noon 8/11 -8/15 patient reported to be a little more alert working with OT this morning.  Continue current regimen -8/20 Continue methylphenidate  10 mg twice daily 5. Neuropsych/cognition: This patient is not  capable of making decisions  6. Skin/Wound Care:  Routine pressure relief measures.  7. Fluids/Electrolytes/Nutrition: Continue NPO due to holding of orals.              --on tube feeds.  Appreciate dietitian assistance. 8. HTN: Monitor BP TID--goal 150 to avoid symptoms (multiple ED visits this year)             --hx of chronic dizziness. Needs to stay hydrated.  -8/22-23 well-controlled overall continue current regimen Vitals:   01/20/24 0417 01/20/24 1554 01/20/24 2007 01/21/24 0439  BP: 137/87 (!) 133/93 (!) 140/91 (!) 128/95   01/21/24 1000 01/21/24 1236 01/21/24 1345 01/21/24 1951  BP: (!) 86/69 103/77 109/67 128/72   01/22/24 0425 01/22/24 1504 01/22/24 2052 01/23/24 0453  BP: 131/89 108/81 112/81 101/75    9. H/o SVT/CHF s/p PPM/syncope: Monitor for orthostatic symptoms   10. BPH/urinary retention: Urecholine  and Proscar  cannot be crushed per pharmacy so will hold (cannot use Avodart  capsule due to TF).  -01/09/24 PVR documented once, 66; monitor; of note, getting urocholine 10mg  TID... -01/10/24 low PVRs but not many documented -01/17/24 poor documentation of PVRs, but using condom cath and output looks ok. Monitor.  -8/19 has not required recent IC, bladder scan volumes not particularly elevated.   -8/20 bladder scan 0-230 continue to monitor  - 8/22 incontinence present but not having retention requiring IC  11.  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 Glaucoma: Resume home gtts Alphagan , Trusopt , Xalatan . 13. Dysphagia: NPO with tube feeds. May need PEG for nutritional support. BUN up will increase free H20  - Patient has IR consult for PEG, CT scan completed  - PEG for tomorrow scheduled.  Tube feeds and free water  held at midnight.  - 8/19 PEG placed today, IR indicates diet may be advanced tomorrow.  Will start gentle IVF NS for 24h   --8/20 Tube feeding starting today, Will not continue IVF after this afternoon   -8/21 continue tube feedings with PEG  8/22 patient's wife is still working on getting home tube feeding formula delivered 14. Acute renal failure: Improved from 24/1.36 to 27/0.99>> 30/0.89  - 8/14 improved at creatinine 0.79/BUN 30  -8/18 BUN/creatinine stable at 32/0.83   -8/20 Free water  restarted by PEG today   - 8/21 BUN/creatinine stable  Recheck for Monday ordered 15. Chronic Thrombocytopenia: stable/improving    -8/21 stable 221    Latest Ref Rng & Units 01/21/2024    5:30 AM 01/18/2024    6:16 AM 01/14/2024    5:03 AM  CBC  WBC 4.0 - 10.5 K/uL 6.6  5.6  7.0   Hemoglobin 13.0 - 17.0 g/dL 85.2  86.0  84.4   Hematocrit 39.0 - 52.0 % 43.7  42.1  47.6   Platelets 150 - 400 K/uL 221  213  179      16.  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  UA + WBC but only rare bact, treat empirically keflex  500mg  BID x 3 d to see if this improves MS -No change in cognition noted  Urine blood tinged yesterday , monitor Hgb ok  Hgb is normal , mildly low plt also on asa , plavix  and enoxaparin , will monitor  May need to hold enoxaparin   8/15 urine recorded is yellow/straw, continue to monitor  18. Hx of gout: allopurinol  100mg  daily  LOS: 19 days A FACE TO FACE EVALUATION WAS PERFORMED  [address] 01/23/2024, 11:49 AM

## 2024-01-23 NOTE — Progress Notes (Signed)
 Speech Language Pathology Daily Session Note  Patient Details  Name: Dustin Hamilton MRN: [id number] Date of Birth: February 14, 1941  Today's Date: 01/23/2024 SLP Individual Time: 8696-8653 SLP Individual Time Calculation (min): 43 min  Short Term Goals: Week 3: SLP Short Term Goal 1 (Week 3): STG = LTG due to ELOS  Skilled Therapeutic Interventions:  Patient was seen in PM to address dysphagia management, language, and cognitive re- training. Pt easily alerted upon SLP arrival. Limited verbal output initially, though able to shake head yes or no to environmental and immediate situation questions. Assigned NT assisted SLP in repositioning pt upright. SLP completed oral hyhgiene via suction toothbrush. SLP subsequently administered small ice chips at a time. Pt with no bolus stripping in response to spoon in spite of max cues. He also demo limited to no oral manipulation in spite of max verbal and visual cues. Swallow initiation observed x4 occasionally given tactile stimulation toward pharynx and otherwise spontaneously. Pt with no s/s aspiration in 4/4 trials. In other minutes of session, SLP addressing verbalization of basic biographical information. Again, pt with limited verbal output initially however improved some across session he verbalized his name in 2/3 opportunities,his wife and son's name, DOB, and current city however verbal output was limited beyond that. Carryover of current environment and his age remained poor across session in spite of max cues. After re-training on use of red button to call for assitance pt able to verbalize red button when asked after how to get assistance. At conclusion of session pt was left upright in bed with call button within reach. SLP to continue POC.   Pain Pain Assessment Pain Scale: 0-10 Pain Score: 0-No pain  Therapy/Group: Individual Therapy  [name] 01/23/2024, 1:09 PM

## 2024-01-23 NOTE — Progress Notes (Signed)
 Occupational Therapy Session Note  Patient Details  Name: CECILIA NISHIKAWA MRN: [id number] Date of Birth: May 10, 1941  Today's Date: 01/23/2024 OT Individual Time: 1416-1500 OT Individual Time Calculation (min): 44 min    Short Term Goals: Week 3:  OT Short Term Goal 1 (Week 3): STG=LTG d/t ELOS  Skilled Therapeutic Interventions/Progress Updates:  Patient agreeable to participate in OT session. Reports no pain level.   Patient participated in skilled OT session focusing on stnding tolerance, functional transfers, alertness, and use of UE> patient completes supine to sit max A, transfer max ax2, completed sit to stand x4 with max to total A. Completed functional UE use for face wiping and grooming with HOH for facilitation. Completed transfer back to bed max a x2 and static sitting on EOB mod A. Sit to supine max A, scooting up in bed max A. Patient more alert and responsive to verbal cues and instruction. Returned to bed all needs in reach.  Therapy Documentation Precautions:  Precautions Precautions: Fall Recall of Precautions/Restrictions: Impaired Precaution/Restrictions Comments: L hemipareisis, NPO, CorTrak Restrictions Weight Bearing Restrictions Per Provider Order: No  Therapy/Group: Individual Therapy  D'mariea L Delno Blaisdell 01/23/2024, 3:11 PM

## 2024-01-23 NOTE — Plan of Care (Signed)
  Problem: RH BLADDER ELIMINATION Goal: RH STG MANAGE BLADDER WITH ASSISTANCE Description: STG Manage Bladder With min Assistance Outcome: Progressing   Problem: RH SAFETY Goal: RH STG ADHERE TO SAFETY PRECAUTIONS W/ASSISTANCE/DEVICE Description: STG Adhere to Safety Precautions With cues Assistance/Device. Outcome: Progressing   Problem: RH PAIN MANAGEMENT Goal: RH STG PAIN MANAGED AT OR BELOW [name] PAIN GOAL Description: Pain < 4 with prns Outcome: Progressing

## 2024-01-23 NOTE — Progress Notes (Signed)
 Physical Therapy Session Note  Patient Details  Name: Dustin Hamilton MRN: [id number] Date of Birth: [birth year]  Today's Date: 01/23/2024 PT Individual Time: 1040-1150 PT Individual Time Calculation (min): 70 min   Short Term Goals: Week 2:  PT Short Term Goal 1 (Week 2): Pt will complete bed mobility with MaxA +1. PT Short Term Goal 2 (Week 2): Pt will perform sit<>stand with MaxA +1. PT Short Term Goal 3 (Week 2): Pt will perform seat to seat pivot transfers with MaxA +2. PT Short Term Goal 4 (Week 2): Pt will maintain sitting balance with vc and self corrections and overall MinA for more than . Week 3:     Skilled Therapeutic Interventions/Progress Updates:      Therapy Documentation Precautions:  Precautions Precautions: Fall Recall of Precautions/Restrictions: Impaired Precaution/Restrictions Comments: L hemipareisis, NPO, CorTrak Restrictions Weight Bearing Restrictions Per Provider Order: No General:   Vital Signs:   Pain: Pain Assessment Pain Scale: 0-10 Pain Score: 0-No pain Mobility:   Locomotion :    Trunk/Postural Assessment :    Balance:   Exercises:   Other Treatments:      Therapy/Group: Individual Therapy  Mliss DELENA Milliner PT, DPT, CSRS 01/23/2024, 1:17 PM

## 2024-01-23 NOTE — Plan of Care (Signed)
  Problem: Consults Goal: RH STROKE PATIENT EDUCATION Description: See Patient Education module for education specifics  Outcome: Progressing   Problem: RH BOWEL ELIMINATION Goal: RH STG MANAGE BOWEL WITH ASSISTANCE Description: STG Manage Bowel with toileting min Assistance. Outcome: Progressing Goal: RH STG MANAGE BOWEL W/MEDICATION W/ASSISTANCE Description: STG Manage Bowel with Medication with mod I Assistance. Outcome: Progressing   Problem: RH BLADDER ELIMINATION Goal: RH STG MANAGE BLADDER WITH ASSISTANCE Description: STG Manage Bladder With min Assistance Outcome: Progressing Goal: RH STG MANAGE BLADDER WITH MEDICATION WITH ASSISTANCE Description: STG Manage Bladder With Medication With mod I Assistance. Outcome: Progressing   Problem: RH SAFETY Goal: RH STG ADHERE TO SAFETY PRECAUTIONS W/ASSISTANCE/DEVICE Description: STG Adhere to Safety Precautions With cues Assistance/Device. Outcome: Progressing   Problem: RH COGNITION-NURSING Goal: RH STG USES MEMORY AIDS/STRATEGIES W/ASSIST TO PROBLEM SOLVE Description: STG Uses Memory Aids/Strategies With min Assistance to Problem Solve. Outcome: Progressing   Problem: RH KNOWLEDGE DEFICIT Goal: RH STG INCREASE KNOWLEDGE OF DYSPHAGIA/FLUID INTAKE Description: Patient and wife will be able to manage dysphagia using educational resources for medications and dietary modification independentl Outcome: Progressing

## 2024-01-24 LAB — GLUCOSE, CAPILLARY
Glucose-Capillary: 105 mg/dL — ABNORMAL HIGH (ref 70–99)
Glucose-Capillary: 104 mg/dL — ABNORMAL HIGH (ref 70–99)
Glucose-Capillary: 164 mg/dL — ABNORMAL HIGH (ref 70–99)
Glucose-Capillary: 127 mg/dL — ABNORMAL HIGH (ref 70–99)

## 2024-01-24 NOTE — Progress Notes (Signed)
 Speech Language Pathology Daily Session Note  Patient Details  Name: Dustin Hamilton MRN: [id number] Date of Birth: 02-Oct-1940  Today's Date: 01/24/2024 SLP Individual Time: 1250-1345 SLP Individual Time Calculation (min): 55 min  Short Term Goals: Week 3: SLP Short Term Goal 1 (Week 3): STG = LTG due to ELOS  Skilled Therapeutic Interventions:   Pt was seen for skilled ST services targeting dysphagia and language. Pt was awake upon SLP arrival, answering questions with shake of head for yes/no. SLP  changed to asking more open-ended questions to encourage pt to respond vocally. SLP raised HOB and completed oral care via suction toothbrush. Pt did not initiate use of R hand to assist. SLP provided pt with small, single ice chips. SLP provided max multimodal cues for oral manipulation and swallow initiation, as pt was observed to bolus hold. With tactile stimulation, pt initiated swallow for all trials. SLP observed (weak) cough in 2/5 trials today. Pt declined further trials. 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 SLP facilitated session by asking pt to describe an apple. SLP provided prompts and subsequent modA cueing (what shape, what color, where you could find them etc). When asked pt shared that green apples were his favorite. Pt was able to generate 4 animals given max A cues. At end of session, SLP reinforced use of call bell ( hitting the red button) to call for help should he need it. Pt was left in bed, HOB elevated, all immediate needs within reach, and all safety measures activated. Cont with current ST POC.   Pain Pain Assessment Pain Score: 0-No pain  Therapy/Group: Individual Therapy  Lacinda KANDICE Lorenzo 01/24/2024, 1:47 PM

## 2024-01-24 NOTE — Plan of Care (Signed)
  Problem: RH Wheelchair Mobility Goal: LTG Patient will propel w/c in controlled environment (PT) Description: LTG: Patient will propel wheelchair in controlled environment, # of feet with assist (PT) Outcome: Not Applicable Note: Pt unable to tolerate sitting in standard w/c and therefore is unable to self-propel.   Problem: RH Balance Goal: LTG Patient will maintain dynamic sitting balance (PT) Description: LTG:  Patient will maintain dynamic sitting balance with assistance during mobility activities (PT) Flowsheets (Taken 01/24/2024 2308) LTG: Pt will maintain dynamic sitting balance during mobility activities with:: Moderate Assistance - Patient 50 - 74% Goal: LTG Patient will maintain dynamic standing balance (PT) Description: LTG:  Patient will maintain dynamic standing balance with assistance during mobility activities (PT) Flowsheets (Taken 01/24/2024 2308) LTG: Pt will maintain dynamic standing balance during mobility activities with:: Total Assistance - Patient < 25%   Problem: Sit to Stand Goal: LTG:  Patient will perform sit to stand with assistance level (PT) Description: LTG:  Patient will perform sit to stand with assistance level (PT) Flowsheets (Taken 01/24/2024 2308) LTG: PT will perform sit to stand in preparation for functional mobility with assistance level: Maximal Assistance - Patient 25 - 49%   Problem: RH Bed Mobility Goal: LTG Patient will perform bed mobility with assist (PT) Description: LTG: Patient will perform bed mobility with assistance, with/without cues (PT). Flowsheets (Taken 01/24/2024 2308) LTG: Pt will perform bed mobility with assistance level of: Maximal Assistance - Patient 25 - 49%   Problem: RH Bed to Chair Transfers Goal: LTG Patient will perform bed/chair transfers w/assist (PT) Description: LTG: Patient will perform bed to chair transfers with assistance (PT). Flowsheets (Taken 01/24/2024 2308) LTG: Pt will perform Bed to Chair Transfers with  assistance level: Total Assistance - Patient < 25%   Problem: RH Ambulation Goal: LTG Patient will ambulate in controlled environment (PT) Description: LTG: Patient will ambulate in a controlled environment, # of feet with assistance (PT). Flowsheets Taken 01/24/2024 2308 LTG: Pt will ambulate in controlled environ  assist needed:: Total Assistance - Patient < 25% Taken 01/06/2024 0558 LTG: Ambulation distance in controlled environment: 30 ft using LRAD

## 2024-01-24 NOTE — Progress Notes (Signed)
 Occupational Therapy Session Note  Patient Details  Name: Dustin Hamilton MRN: [id number] Date of Birth: [birth year]  Today's Date: 01/24/2024 OT Individual Time: 1000-1045 OT Individual Time Calculation (min): 45 min    Short Term Goals: Week 1:  OT Short Term Goal 1 (Week 1): Pt will follow single step command during ADLs with verbal cues only OT Short Term Goal 1 - Progress (Week 1): Not met OT Short Term Goal 2 (Week 1): Pt will locate 5/5 obejcts in L visual field with SUP OT Short Term Goal 2 - Progress (Week 1): Not met OT Short Term Goal 3 (Week 1): Pt will maintain sitting balance in preparation for functional transfes with CGA OT Short Term Goal 3 - Progress (Week 1): Not met OT Short Term Goal 4 (Week 1): Pt will complete one step of dressing task with SUP OT Short Term Goal 4 - Progress (Week 1): Not met  Skilled Therapeutic Interventions/Progress Updates:    Patietnt resting in bed at the time of arrival, alert looking around and observing my presence.  The pt was transferrred from supine in bed to EOB at MaxAx2 using the pad. The pt was able to maintain upright positioning with a ball behind his back and incorporating his RUE for holding the bed rail.  The pt was in agreement with trying to improve his functional outcome through NMR.   The pt tolerated NMR of the trunk and surrounding structures to improve communication for > mobility.  The electric hand held massager was used to decrease the tone with the pt scapulars, upper back and neck region for > gains in functional mobility.   The pt tolerated manual manipulation of BUE inclusive of the shld, neck region and the upper body for engaging the pt in supination and pronation of BUE and ulnar and radial deviation of the bilateral hands. The pt was able to complete a cone exercise by retriving the cone from my L hand and placing the cone on top a the cone positioned in my R hand.  At the end of the session, the pt was  transferred  from EOB to supine at [facility] and he was repositioned up in bed  at MaxA x2.  All additional needs of the pt were addressed prior to exiting the room.   Therapy Documentation Precautions:  Precautions Precautions: Fall Recall of Precautions/Restrictions: Impaired Precaution/Restrictions Comments: L hemipareisis, NPO, CorTrak Restrictions Weight Bearing Restrictions Per Provider Order: No   Therapy/Group: Individual Therapy  [name] 01/24/2024, 12:39 PM

## 2024-01-24 NOTE — Progress Notes (Signed)
 Physical Therapy Session Note  Patient Details  Name: Dustin Hamilton MRN: [id number] Date of Birth: [birth year]  Today's Date: 01/24/2024 PT Individual Time: 1448-1530 PT Individual Time Calculation (min): 42 min   Short Term Goals: Week 2:  PT Short Term Goal 1 (Week 2): Pt will complete bed mobility with MaxA +1. PT Short Term Goal 2 (Week 2): Pt will perform sit<>stand with MaxA +1. PT Short Term Goal 3 (Week 2): Pt will perform seat to seat pivot transfers with MaxA +2. PT Short Term Goal 4 (Week 2): Pt will maintain sitting balance with vc and self corrections and overall MinA for more than .  Skilled Therapeutic Interventions/Progress Updates:     Pt received supine in bed and agrees to therapy. No indication of pain. Pt noted to have brief soiled with urine and condom cath dislodged. Pt performs bilateral rolling with maxA to perform pericare and don clean brief, with cues for logrolling, body mechanics, and hand placement. Following, pt perform supine to sit with dependent assistance with Pt facilitating trunk stability and safe hip positioning at EOB. Pt requires minA/modA for static sitting at EOB while doffing and donning gown. Pt performs squat pivot from bed to WC on Lt side with totalA and facilitation of anterior weight shifting, lateral weight shifting, and sequencing. WC transport to gym. Pt performs sit to stand in parallel bars with mirror provided for visual feedback. Pt requires totalA for sit to stand with PT facilitating hand placement, blocking of bilateral lower extremities, and heavy manual assistance to promote hip extension. WC transport back to room. RN notified that pt's RUE IV dislodged during session> Pt left seated in tilt in space WC with all needs within reach, and family in room.   Therapy Documentation Precautions:  Precautions Precautions: Fall Recall of Precautions/Restrictions: Impaired Precaution/Restrictions Comments: L hemipareisis, NPO,  CorTrak Restrictions Weight Bearing Restrictions Per Provider Order: No    Therapy/Group: Individual Therapy  Elsie JAYSON Dawn, PT, DPT 01/24/2024, 3:34 PM

## 2024-01-24 NOTE — Plan of Care (Signed)
  Problem: RH PAIN MANAGEMENT Goal: RH STG PAIN MANAGED AT OR BELOW [name] PAIN GOAL Description: Pain < 4 with prns Outcome: Progressing   Problem: Consults Goal: RH STROKE PATIENT EDUCATION Description: See Patient Education module for education specifics  Outcome: Not Progressing   Problem: RH KNOWLEDGE DEFICIT Goal: RH STG INCREASE KNOWLEDGE OF STROKE PROPHYLAXIS Description: Patient and wife will be able to manage secondary risks using educational resources for medications and dietary modification independentl Outcome: Not Progressing

## 2024-01-24 NOTE — Progress Notes (Signed)
 PROGRESS NOTE   Subjective/Complaints:  Pt doing well, more engaged today and actually answering questions! Says he slept well, and feels that was very helpful for him. Denies pain. Recalls that his LBM was 2 days ago! Says he's urinating fine, is incontinent. Denies any other complaints.   ROS- limited by cognition but specifically denies pain, CP, SOB, abd pain, n/v  Objective:   No results found.   No results for input(s): WBC, HGB, HCT, PLT in the last 72 hours.    No results for input(s): NA, K, CL, CO2, GLUCOSE, BUN, CREATININE, CALCIUM  in the last 72 hours.     Intake/Output Summary (Last 24 hours) at 01/24/2024 1046 Last data filed at 01/24/2024 0841 Gross per 24 hour  Intake 800 ml  Output 1400 ml  Net -600 ml        Physical Exam: Vital Signs Blood pressure 124/81, pulse 72, temperature 97.9 F (36.6 C), resp. rate 18, height 5' 11 (1.803 m), weight 55 kg, SpO2 98%.  General: No acute distress, laying in bed, awake and much more alert! ENT- cortrak in nares has been removed Masked facies  Mood and affect flat, much more interactive today and answers questions Heart: Regular rate and rhythm no rubs murmurs or extra sounds Lungs: Clear to auscultation bilaterally though poor inspiratory effort, nonlabored breathing Abdomen: Positive bowel sounds, soft nontender to palpation, nondistended PEG tube in place- looks ok, no tenderness noted around PEG tube site Extremities: No clubbing, cyanosis, or edema, TEDs donned Skin: No breakdown noted over visible surfaces Neuro: Much more alert this morning, much more interactive. Able to have a simple conversation.  PRIOR EXAMS:  Neurologic: masked facies, eyes closed , no spontaneous movement .Sensory exam limited by attention and concentration   Musculoskeletal: no pain with hip or knee ROM BLE   Prior exam  motor strength is 4/5 in  right deltoid, bicep, tricep, grip, 2-/5 LUE and LLE- MMT varies based on LOA - lost focus during LE MMT with limited attempt despite cuing   Assessment/Plan: 1. Functional deficits which require 3+ hours per day of interdisciplinary therapy in a comprehensive inpatient rehab setting. Physiatrist is providing close team supervision and 24 hour management of active medical problems listed below. Physiatrist and rehab team continue to assess barriers to discharge/monitor patient progress toward functional and medical goals  Care Tool:  Bathing    Body parts bathed by patient: Face   Body parts bathed by helper: Left arm, Abdomen, Chest, Right arm, Right upper leg, Left upper leg     Bathing assist Assist Level: 2 Helpers     Upper Body Dressing/Undressing Upper body dressing   What is the patient wearing?: Pull over shirt    Upper body assist Assist Level: 2 Helpers    Lower Body Dressing/Undressing Lower body dressing      What is the patient wearing?: Underwear/pull up, Pants     Lower body assist Assist for lower body dressing: 2 Helpers     Toileting Toileting    Toileting assist Assist for toileting: Dependent - Patient 0%     Transfers Chair/bed transfer  Transfers assist  Chair/bed transfer activity did not  occur: Safety/medical concerns  Chair/bed transfer assist level: Total Assistance - Patient < 25%     Locomotion Ambulation   Ambulation assist   Ambulation activity did not occur: Safety/medical concerns  Assist level: 2 helpers Assistive device: Other (comment) (3 musketeer support) Max distance: 6 ft   Walk 10 feet activity   Assist  Walk 10 feet activity did not occur: Safety/medical concerns        Walk 50 feet activity   Assist Walk 50 feet with 2 turns activity did not occur: Safety/medical concerns         Walk 150 feet activity   Assist Walk 150 feet activity did not occur: Safety/medical concerns         Walk  10 feet on uneven surface  activity   Assist Walk 10 feet on uneven surfaces activity did not occur: Safety/medical concerns         Wheelchair     Assist Is the patient using a wheelchair?: Yes Type of Wheelchair: Manual Wheelchair activity did not occur: Safety/medical concerns  Wheelchair assist level: Dependent - Patient 0%      Wheelchair 50 feet with 2 turns activity    Assist    Wheelchair 50 feet with 2 turns activity did not occur: Safety/medical concerns   Assist Level: Dependent - Patient 0%   Wheelchair 150 feet activity     Assist  Wheelchair 150 feet activity did not occur: Safety/medical concerns   Assist Level: Dependent - Patient 0%   Blood pressure 124/81, pulse 72, temperature 97.9 F (36.6 C), resp. rate 18, height 5' 11 (1.803 m), weight 55 kg, SpO2 98%.  Medical Problem List and Plan: 1. 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  Will discuss further with team tomorrow team meeting -Team conference today please see physician documentation under team conference tab, met with team  to discuss problems,progress, and goals. Formulized individual treatment plan based on medical history, underlying problem and comorbidities.  - Patient's wife is still waiting on Tis wheelchair and tube feeding supplies.  She is also still working on getting the daytime caregiver. 2.  Antithrombotics: -DVT/anticoagulation:  Pharmaceutical: Lovenox  40mg  daily             -antiplatelet therapy: ASA/Plavix  X 3 weeks followed by Plavix  alone Plavix  and aspirin  held for PEG-- Restart   3. Pain  Management: Tylenol  prn.  4. Mood/Behavior/Sleep: LCSW to follow for evaluation and support.             --hx of sundowning-->continue melatonin 3 mg/Monitor sleep hygeine             -antipsychotic agents: N/A -Reduced level of alertness trial amantadine - no significant changes will trial methylphenidate  10mg  BID starting noon 8/11 -8/15 patient reported to be a little more alert working with OT this morning.  Continue current regimen -8/20 Continue methylphenidate  10 mg twice daily -01/24/24 much more alert today! Actually has a conversation with me today 5. Neuropsych/cognition: This patient is not capable of making decisions  6. Skin/Wound Care:  Routine  pressure relief measures.  7. Fluids/Electrolytes/Nutrition: Continue NPO due to holding of orals.              --on tube feeds.  Appreciate dietitian assistance. 8. HTN: Monitor BP TID--goal 150 to avoid symptoms (multiple ED visits this year)             --hx of chronic dizziness. Needs to stay hydrated.  -8/22-24 well-controlled overall continue current regimen Vitals:   01/21/24 0439 01/21/24 1000 01/21/24 1236 01/21/24 1345  BP: (!) 128/95 (!) 86/69 103/77 109/67   01/21/24 1951 01/22/24 0425 01/22/24 1504 01/22/24 2052  BP: 128/72 131/89 108/81 112/81   01/23/24 0453 01/23/24 1538 01/23/24 2046 01/24/24 0453  BP: 101/75 119/78 (!) 144/93 124/81    9. H/o SVT/CHF s/p PPM/syncope: Monitor for orthostatic symptoms   10. BPH/urinary retention: Urecholine  and Proscar  cannot be crushed per pharmacy so will hold (cannot use Avodart  capsule due to TF).  -01/09/24 PVR documented once, 66; monitor; of note, getting urocholine 10mg  TID... -01/10/24 low PVRs but not many documented -01/17/24 poor documentation of PVRs, but using condom cath and output looks ok. Monitor.  -8/19 has not required recent IC, bladder scan volumes not particularly elevated.   -8/20 bladder scan 0-230 continue to monitor  - 8/22-24 incontinence present but not  having retention requiring IC  11. 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 Glaucoma: Resume home gtts Alphagan , Trusopt , Xalatan . 13. Dysphagia: NPO with tube feeds. May need PEG for nutritional support. BUN up will increase free H20  - Patient has IR consult for PEG, CT scan completed  - PEG for tomorrow scheduled.  Tube feeds and free water  held at midnight.  - 8/19 PEG placed today, IR indicates diet may be advanced tomorrow.  Will start gentle IVF NS for 24h   --8/20 Tube feeding starting today, Will not continue IVF after this afternoon   -8/21 continue tube feedings with PEG  8/22 patient's wife is still working on getting home tube feeding formula delivered 14. Acute renal failure: Improved from 24/1.36 to 27/0.99>> 30/0.89  - 8/14 improved at creatinine 0.79/BUN 30  -8/18 BUN/creatinine stable at 32/0.83   -8/20 Free water  restarted by PEG today   - 8/21 BUN/creatinine stable  Recheck for Monday ordered 15. Chronic Thrombocytopenia: stable/improving    -8/21 stable 221    Latest Ref Rng & Units 01/21/2024    5:30 AM 01/18/2024    6:16 AM 01/14/2024    5:03 AM  CBC  WBC 4.0 - 10.5 K/uL 6.6  5.6  7.0   Hemoglobin 13.0 - 17.0 g/dL 85.2  86.0  84.4   Hematocrit 39.0 - 52.0 % 43.7  42.1  47.6   Platelets 150 - 400 K/uL 221  213  179      16.  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  UA + WBC but only rare bact, treat empirically keflex  500mg  BID x 3 d to see if this improves MS -No change in cognition noted  Urine blood tinged yesterday , monitor Hgb ok  Hgb is normal , mildly low plt also on asa , plavix  and enoxaparin , will monitor  May need to hold enoxaparin   8/15 urine recorded is yellow/straw, continue to monitor  18. Hx of gout: allopurinol  100mg  daily   LOS: 20 days A FACE TO FACE EVALUATION WAS  PERFORMED  [address] 01/24/2024, 10:46 AM

## 2024-01-24 NOTE — Progress Notes (Signed)
 Pt spouse & other adult relative at bedside, both acknowledge teaching/understanding of PEG tube for Feeding & Rx admin. Pt spouse stated is still trying to get Home Caregiver to be available in preparation for DC. Will continue to monitor.

## 2024-01-25 DIAGNOSIS — G4700 Insomnia, unspecified: Secondary | ICD-10-CM | POA: Insufficient documentation

## 2024-01-25 DIAGNOSIS — R1310 Dysphagia, unspecified: Secondary | ICD-10-CM

## 2024-01-25 DIAGNOSIS — R7989 Other specified abnormal findings of blood chemistry: Secondary | ICD-10-CM | POA: Insufficient documentation

## 2024-01-25 DIAGNOSIS — I639 Cerebral infarction, unspecified: Secondary | ICD-10-CM | POA: Insufficient documentation

## 2024-01-25 LAB — CBC
HCT: 43.8 % (ref 39.0–52.0)
Hemoglobin: 14.9 g/dL (ref 13.0–17.0)
MCH: 31.9 pg (ref 26.0–34.0)
MCHC: 34 g/dL (ref 30.0–36.0)
MCV: 93.8 fL (ref 80.0–100.0)
Platelets: 209 K/uL (ref 150–400)
RBC: 4.67 MIL/uL (ref 4.22–5.81)
RDW: 13.8 % (ref 11.5–15.5)
WBC: 7 K/uL (ref 4.0–10.5)
nRBC: 0 % (ref 0.0–0.2)

## 2024-01-25 LAB — BASIC METABOLIC PANEL WITH GFR
Anion gap: 7 (ref 5–15)
BUN: 36 mg/dL — ABNORMAL HIGH (ref 8–23)
CO2: 25 mmol/L (ref 22–32)
Calcium: 9.7 mg/dL (ref 8.9–10.3)
Chloride: 107 mmol/L (ref 98–111)
Creatinine, Ser: 1.15 mg/dL (ref 0.61–1.24)
GFR, Estimated: >60 mL/min (ref >60)
Glucose, Bld: 89 mg/dL (ref 70–99)
Potassium: 4.3 mmol/L (ref 3.5–5.1)
Sodium: 139 mmol/L (ref 135–145)

## 2024-01-25 LAB — GLUCOSE, CAPILLARY
Glucose-Capillary: 92 mg/dL (ref 70–99)
Glucose-Capillary: 120 mg/dL — ABNORMAL HIGH (ref 70–99)
Glucose-Capillary: 159 mg/dL — ABNORMAL HIGH (ref 70–99)
Glucose-Capillary: 146 mg/dL — ABNORMAL HIGH (ref 70–99)

## 2024-01-25 MED ORDER — FREE WATER
300.0000 mL | Freq: Three times a day (TID) | Status: DC
  Administered 2024-01-25 – 2024-01-29 (×14): 300 mL

## 2024-01-25 MED ORDER — ASPIRIN 81 MG PO CHEW
81.0000 mg | CHEWABLE_TABLET | Freq: Every day | ORAL | Status: AC
  Administered 2024-01-26: 81 mg
  Filled 2024-01-25: qty 1

## 2024-01-25 MED ORDER — CLOPIDOGREL BISULFATE 75 MG PO TABS
75.0000 mg | ORAL_TABLET | Freq: Every day | ORAL | Status: DC
  Administered 2024-01-26 – 2024-01-29 (×4): 75 mg
  Filled 2024-01-25 (×4): qty 1

## 2024-01-25 MED ORDER — FREE WATER
200.0000 mL | Freq: Every day | Status: DC
  Administered 2024-01-25: 200 mL

## 2024-01-25 NOTE — Progress Notes (Addendum)
 Patient ID: Dustin Hamilton, male   DOB: Sep 22, 1940, 83 y.o.   MRN: [id number]  Spoke with wife who reports she still has not received the tube feedings and wheelchair. [facility]. She reports speech is doing a swallowing test today.  11:46 AM Spoke with [facility] reports the [facility] chair will be shipped Wed to pt's home. Still working on the tube feedings. Will let wife know

## 2024-01-25 NOTE — Progress Notes (Signed)
 PROGRESS NOTE   Subjective/Complaints:  Little drowsy today.  No new concerns elicited.  BM yesterday.  ROS- limited by cognition but specifically denies chills, pain, CP, SOB, abd pain, n/v  Objective:   No results found.   Recent Labs    01/25/24 0516  WBC 7.0  HGB 14.9  HCT 43.8  PLT 209      Recent Labs    01/25/24 0516  NA 139  K 4.3  CL 107  CO2 25  GLUCOSE 89  BUN 36*  CREATININE 1.15  CALCIUM  9.7       Intake/Output Summary (Last 24 hours) at 01/25/2024 1427 Last data filed at 01/25/2024 0841 Gross per 24 hour  Intake 600 ml  Output 200 ml  Net 400 ml        Physical Exam: Vital Signs Blood pressure 117/89, pulse 77, temperature (!) 97.4 F (36.3 C), temperature source Oral, resp. rate 20, height 5' 11 (1.803 m), weight 55 kg, SpO2 100%.  General: No acute distress, laying in bed, drowsy Mood and affect flat Heart: Regular rate and rhythm no rubs murmurs or extra sounds Lungs: Clear to auscultation bilaterally though poor inspiratory effort, nonlabored breathing Abdomen: Positive bowel sounds, soft nontender to palpation, nondistended PEG tube in place- looks ok, no tenderness noted around PEG tube site Extremities: No clubbing, cyanosis, or edema, TEDs donned Skin: No breakdown noted over visible surfaces Neuro: Very drowsy, wakes to voice, delayed response  Neurologic: masked facies, eyes closed , no spontaneous movement .Sensory exam limited by attention and concentration   Musculoskeletal: no pain with hip or knee ROM BLE   Prior exam  motor strength is 4/5 in right deltoid, bicep, tricep, grip, 2-/5 LUE and LLE- MMT varies based on LOA - lost focus during LE MMT with limited attempt despite cuing   Assessment/Plan: 1. Functional deficits which require 3+ hours per day of interdisciplinary therapy in a comprehensive inpatient rehab setting. Physiatrist is providing close  team supervision and 24 hour management of active medical problems listed below. Physiatrist and rehab team continue to assess barriers to discharge/monitor patient progress toward functional and medical goals  Care Tool:  Bathing    Body parts bathed by patient: Face   Body parts bathed by helper: Left arm, Abdomen, Chest, Right arm, Right upper leg, Left upper leg     Bathing assist Assist Level: 2 Helpers     Upper Body Dressing/Undressing Upper body dressing   What is the patient wearing?: Pull over shirt    Upper body assist Assist Level: 2 Helpers    Lower Body Dressing/Undressing Lower body dressing      What is the patient wearing?: Underwear/pull up, Pants     Lower body assist Assist for lower body dressing: 2 Helpers     Toileting Toileting    Toileting assist Assist for toileting: Dependent - Patient 0%     Transfers Chair/bed transfer  Transfers assist  Chair/bed transfer activity did not occur: Safety/medical concerns  Chair/bed transfer assist level: Total Assistance - Patient < 25%     Locomotion Ambulation   Ambulation assist   Ambulation activity did not occur: Safety/medical concerns  Assist level: 2 helpers Assistive device: Other (comment) (3 musketeer support) Max distance: 6 ft   Walk 10 feet activity   Assist  Walk 10 feet activity did not occur: Safety/medical concerns        Walk 50 feet activity   Assist Walk 50 feet with 2 turns activity did not occur: Safety/medical concerns         Walk 150 feet activity   Assist Walk 150 feet activity did not occur: Safety/medical concerns         Walk 10 feet on uneven surface  activity   Assist Walk 10 feet on uneven surfaces activity did not occur: Safety/medical concerns         Wheelchair     Assist Is the patient using a wheelchair?: Yes Type of Wheelchair: Manual Wheelchair activity did not occur: Safety/medical concerns  Wheelchair assist  level: Dependent - Patient 0%      Wheelchair 50 feet with 2 turns activity    Assist    Wheelchair 50 feet with 2 turns activity did not occur: Safety/medical concerns   Assist Level: Dependent - Patient 0%   Wheelchair 150 feet activity     Assist  Wheelchair 150 feet activity did not occur: Safety/medical concerns   Assist Level: Dependent - Patient 0%   Blood pressure 117/89, pulse 77, temperature (!) 97.4 F (36.3 C), temperature source Oral, resp. rate 20, height 5' 11 (1.803 m), weight 55 kg, SpO2 100%.  Medical Problem List and Plan: 1. 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  Will discuss further with team tomorrow team meeting -Team conference today please see physician documentation under team conference tab, met with team  to discuss problems,progress, and goals. Formulized individual treatment plan based on medical history, underlying problem and comorbidities.  - Patient's wife is still waiting on Tis wheelchair and tube feeding supplies.  She is also still working on getting the daytime caregiver. -Social work reports TIS chair to be here Wednesday, still working on tube feedings 2.  Antithrombotics: -DVT/anticoagulation:  Pharmaceutical: Lovenox  40mg  daily             -antiplatelet therapy: ASA/Plavix  X 3 weeks followed by Plavix  alone Plavix  and aspirin  held for PEG-- Restart   3. Pain Management: Tylenol  prn.  4. Mood/Behavior/Sleep: LCSW to follow for evaluation and support.             --hx of sundowning-->continue melatonin 3 mg/Monitor sleep hygeine             -antipsychotic  agents: N/A -Reduced level of alertness trial amantadine - no significant changes will trial methylphenidate  10mg  BID starting noon 8/11 -8/15 patient reported to be a little more alert working with OT this morning.  Continue current regimen -8/20 Continue methylphenidate  10 mg twice daily -01/24/24 much more alert today! Actually has a conversation with me today -8/25 more drowsy again today, possibly because still early in the morning he tends to wake up later in the day 5. Neuropsych/cognition: This patient is not capable of making decisions  6. Skin/Wound Care:  Routine pressure relief measures.  7. Fluids/Electrolytes/Nutrition: Continue NPO due to holding of orals.              --on tube feeds.  Appreciate dietitian assistance. 8. HTN: Monitor BP TID--goal 150 to avoid symptoms (multiple ED visits this year)             --hx of chronic dizziness. Needs to stay hydrated.  -8/22-25 well-controlled overall continue current regimen Vitals:   01/21/24 1951 01/22/24 0425 01/22/24 1504 01/22/24 2052  BP: 128/72 131/89 108/81 112/81   01/23/24 0453 01/23/24 1538 01/23/24 2046 01/24/24 0453  BP: 101/75 119/78 (!) 144/93 124/81   01/24/24 1656 01/24/24 1945 01/25/24 0509 01/25/24 1417  BP: (!) 112/90 (!) 119/96 97/62 117/89    9. H/o SVT/CHF s/p PPM/syncope: Monitor for orthostatic symptoms   10. BPH/urinary retention: Urecholine  and Proscar  cannot be crushed per pharmacy so will hold (cannot use Avodart  capsule due to TF).  -01/09/24 PVR documented once, 66; monitor; of note, getting urocholine 10mg  TID... -01/10/24 low PVRs but not many documented -01/17/24 poor documentation of PVRs, but using condom cath and output looks ok. Monitor.  -8/19 has not required recent IC, bladder scan volumes not particularly elevated.   -8/20 bladder scan 0-230 continue to monitor  - 8/22-25 incontinence present but not having retention requiring IC  11. 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 Glaucoma: Resume home gtts Alphagan , Trusopt , Xalatan . 13. Dysphagia: NPO with tube feeds. May need PEG for nutritional support. BUN up will increase free H20  - Patient has IR consult for PEG, CT scan completed  - PEG for tomorrow scheduled.  Tube feeds and free water  held at midnight.  - 8/19 PEG placed today, IR indicates diet may be advanced tomorrow.  Will start gentle IVF NS for 24h   --8/20 Tube feeding starting today, Will not continue IVF after this afternoon   -8/21 continue tube feedings with PEG  8/22 patient's wife is still working on getting home tube feeding formula delivered  FEES planned for today 14. Acute renal failure: Improved from 24/1.36 to 27/0.99>> 30/0.89  - 8/14 improved at creatinine 0.79/BUN 30  -8/18 BUN/creatinine stable at 32/0.83   -8/20 Free water  restarted by PEG today   - 8/21 BUN/creatinine stable  -8/25 Increase free water   15. Chronic Thrombocytopenia: stable/improving    -8/25 stable 209    Latest Ref Rng & Units 01/25/2024    5:16 AM 01/21/2024    5:30 AM 01/18/2024    6:16 AM  CBC  WBC 4.0 - 10.5 K/uL 7.0  6.6  5.6   Hemoglobin 13.0 - 17.0 g/dL 85.0  85.2  86.0   Hematocrit 39.0 - 52.0 % 43.8  43.7  42.1   Platelets 150 - 400 K/uL 209  221  213      16.  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  UA + WBC but only rare bact, treat empirically keflex  500mg  BID x 3 d to see if this improves MS -No change in cognition noted  Urine blood tinged yesterday , monitor Hgb ok  Hgb is normal , mildly low plt also on asa , plavix  and enoxaparin , will monitor  May need to hold enoxaparin   8/15 urine recorded is yellow/straw, continue to monitor  18. Hx of gout: allopurinol  100mg  daily  LOS: 21 days A FACE TO FACE EVALUATION WAS PERFORMED  Murray Collier 01/25/2024, 2:27 PM

## 2024-01-25 NOTE — Plan of Care (Signed)
  Problem: Consults Goal: RH STROKE PATIENT EDUCATION Description: See Patient Education module for education specifics  Outcome: Progressing   Problem: RH BOWEL ELIMINATION Goal: RH STG MANAGE BOWEL WITH ASSISTANCE Description: STG Manage Bowel with toileting min Assistance. Outcome: Progressing Goal: RH STG MANAGE BOWEL W/MEDICATION W/ASSISTANCE Description: STG Manage Bowel with Medication with mod I Assistance. Outcome: Progressing   Problem: RH BLADDER ELIMINATION Goal: RH STG MANAGE BLADDER WITH ASSISTANCE Description: STG Manage Bladder With min Assistance Outcome: Progressing Goal: RH STG MANAGE BLADDER WITH MEDICATION WITH ASSISTANCE Description: STG Manage Bladder With Medication With mod I Assistance. Outcome: Progressing   Problem: RH SAFETY Goal: RH STG ADHERE TO SAFETY PRECAUTIONS W/ASSISTANCE/DEVICE Description: STG Adhere to Safety Precautions With cues Assistance/Device. Outcome: Progressing   Problem: RH COGNITION-NURSING Goal: RH STG USES MEMORY AIDS/STRATEGIES W/ASSIST TO PROBLEM SOLVE Description: STG Uses Memory Aids/Strategies With min Assistance to Problem Solve. Outcome: Progressing   Problem: RH PAIN MANAGEMENT Goal: RH STG PAIN MANAGED AT OR BELOW [name] PAIN GOAL Description: Pain < 4 with prns Outcome: Progressing   Problem: RH KNOWLEDGE DEFICIT Goal: RH STG INCREASE KNOWLEDGE OF HYPERTENSION Description: Patient and wife will be able to manage HTN using educational resources for medications and dietary modification independently Outcome: Progressing Goal: RH STG INCREASE KNOWLEDGE OF DYSPHAGIA/FLUID INTAKE Description: Patient and wife will be able to manage dysphagia using educational resources for medications and dietary modification independentl Outcome: Progressing Goal: RH STG INCREASE KNOWLEDGE OF STROKE PROPHYLAXIS Description: Patient and wife will be able to manage secondary risks using educational resources for medications and dietary  modification independentl Outcome: Progressing

## 2024-01-25 NOTE — Progress Notes (Signed)
 Occupational Therapy Session Note  Patient Details  Name: Dustin Hamilton MRN: [id number] Date of Birth: 1941-04-04  Today's Date: 01/25/2024 OT Individual Time: 8981-8941 OT Individual Time Calculation (min): 40 min    Short Term Goals: Week 3:  OT Short Term Goal 1 (Week 3): STG=LTG d/t ELOS  Skilled Therapeutic Interventions/Progress Updates:     Pt received sleeping in bed waking upon OT arrival. Pt presenting to be fatigued, in good spirits receptive to skilled OT session reporting 0/10 pain- OT offering intermittent rest breaks, repositioning, and therapeutic support to optimize participation in therapy session. Focused this session on functional cognition, ADL retraining, and body awareness to increase [name] independence and decrease bourdon of care. He stated name at beginning of session, however unable to state current location or situation with gentle reorientation provided. Donned pants bed level with Pt able to follow verbal/tactile cues to initiate lifting B LEs one at a time to weave them into pants. He was able to bend R LE and reach across midline using R UE to grab bed rail and assist with rolling in bed requiring MAX A overall. Transitioned to EOB using bed features with MAX A x2. Sitting EOB, set-up [name] shirt for UB dressing. MAX A required to located and weave L UE into shirt with Pt then then able to assist with weaving R UE and MAX A required to bring shirt over head. Squat pivot to L total A +2 using bed pad. Positioned Pt at sink in [facility] in front of mirror for visual feedback of body alignment. Engaged Pt washing face to work on task initiation and familiar functional activity. MOD HOH A required to bring hand to face, however Pt able to then continue task attending to both R/L sides with verbal cues only. Pt was left resting in TISWC with call bell in reach, seatbelt alarm on, and all needs met.    Therapy Documentation Precautions:  Precautions Precautions:  Fall Recall of Precautions/Restrictions: Impaired Precaution/Restrictions Comments: L hemipareisis, NPO, new PEG placement Restrictions Weight Bearing Restrictions Per Provider Order: No   Therapy/Group: Individual Therapy  Katheryn SHAUNNA Mines 01/25/2024, 8:00 AM

## 2024-01-25 NOTE — Progress Notes (Signed)
 Physical Therapy Session Note  Patient Details  Name: Dustin Hamilton MRN: [id number] Date of Birth: September 12, 1940  Today's Date: 01/25/2024 PT Individual Time: 1130-1201 PT Individual Time Calculation (min): 31 min   Short Term Goals: Week 2:  PT Short Term Goal 1 (Week 2): Pt will complete bed mobility with MaxA +1. PT Short Term Goal 1 - Progress (Week 2): Progressing toward goal PT Short Term Goal 2 (Week 2): Pt will perform sit<>stand with MaxA +1. PT Short Term Goal 2 - Progress (Week 2): Progressing toward goal PT Short Term Goal 3 (Week 2): Pt will perform seat to seat pivot transfers with MaxA +2. PT Short Term Goal 3 - Progress (Week 2): Met PT Short Term Goal 4 (Week 2): Pt will maintain sitting balance with vc and self corrections and overall MinA for more than . PT Short Term Goal 4 - Progress (Week 2): Not met Week 3:  PT Short Term Goal 1 (Week 3): STG = LTG d/t ELOS  Skilled Therapeutic Interventions/Progress Updates:  Patient seated upright in TIS w/c on entrance to room. Patient alert and agreeable to PT session. RN present and providing pt with bolus feeding.   Patient with no pain complaint at start of session.  Therapeutic Activity: Transfers: Pt performed sit<>stand requiring TotA to initiate and then Max/ totA to complete. Provided vc/ tc for technique throughout. Maintains stance for more than . Therapist attempts to decrease guard/ block to L knee but slow move into flexion noted. Increased L lean noted.   Neuromuscular Re-ed: NMR facilitated during session with focus on trunk control. Seated balance. Pt guided in seated reaching to edge of BOS requiring significant lean. He is able to produce forward lean with instructions to retreive item held by rehab tech. Reaches and passes to this therapist to his L side. Requires significant multimodal cueing and eventual MaxA to push against tone to return to upright supported seated position. Performs with reach  to each side to retrieve item and pass to there side. NMR performed for improvements in motor control and coordination, balance, sequencing, judgement, and self confidence/ efficacy in performing all aspects of mobility at highest level of independence.   Patient seated upright in TIS w/c at end of session with brakes locked, belt alarm set, and all needs within reach.   Therapy Documentation Precautions:  Precautions Precautions: Fall Recall of Precautions/Restrictions: Impaired Precaution/Restrictions Comments: L hemipareisis, NPO, new PEG placement Restrictions Weight Bearing Restrictions Per Provider Order: No  Pain: No pain demonstrated or related this session.   Therapy/Group: Individual Therapy  Mliss DELENA Milliner 01/25/2024, 6:09 PM

## 2024-01-25 NOTE — Procedures (Signed)
 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   IMO SNOMED Dx Update Oct 2024     Cerebrovascular disease    Clostridium difficile diarrhea    Congenital anomaly of diaphragm    COVID-19 08/2023   Elevated PSA    Glaucoma, both eyes    Hemorrhoid    Hepatitis B surface antigen positive    02-20-2011   History of adenomatous polyp of colon    2007, 2009 and 2013  tubular adenoma's   History of alcohol  abuse    quit 1963   History of cerebral parenchymal hemorrhage    01/ 2006  left occiptial lobe related to hypertensive crisis   History of CVA (cerebrovascular accident)    09-12-2012  left hippocampus/ amygdala junction and per MRI old white matter infarcts--  per pt residual short- term memory issues   History of fatty infiltration of liver hx visit's at 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    Urethral lesion    urethral mass   UTI (urinary tract infection) 03/15/2023   Past Surgical History:  Past Surgical History:  Procedure Laterality Date   CARDIOVASCULAR STRESS TEST  05/05/2007   normal nuclear study w/ no ischemia/  normal LV fucntion and wall motion , ef60%   COLONOSCOPY  last one 04-06-2012   CYSTO/  LEFT RETROGRADE PYELOGRAM/  CYTOLOGY WASHINGS/  URETEROSCOPY  03/05/2000   INGUINAL HERNIA REPAIR Bilateral 1965 and 1980's   IR 3D INDEPENDENT WKST  07/31/2022   IR ANGIOGRAM PELVIS SELECTIVE OR SUPRASELECTIVE  07/31/2022   IR ANGIOGRAM SELECTIVE EACH ADDITIONAL VESSEL  07/31/2022   IR ANGIOGRAM SELECTIVE EACH ADDITIONAL VESSEL  07/31/2022   IR ANGIOGRAM SELECTIVE EACH ADDITIONAL VESSEL  07/31/2022   IR ANGIOGRAM SELECTIVE EACH ADDITIONAL VESSEL  07/31/2022   IR EMBO TUMOR ORGAN ISCHEMIA INFARCT INC GUIDE ROADMAPPING  07/31/2022   IR GASTROSTOMY TUBE MOD SED  01/19/2024   IR RADIOLOGIST EVAL & MGMT  06/26/2022   IR RADIOLOGIST EVAL & MGMT  08/08/2022   IR RADIOLOGIST EVAL & MGMT  08/21/2022   IR RADIOLOGIST EVAL & MGMT  03/25/2023   IR US  GUIDE VASC ACCESS RIGHT  07/31/2022   LAPAROSCOPIC INGUINAL HERNIA WITH UMBILICAL HERNIA Right 06/24/2007   LIVER BIOPSY  1980's and 2004   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   Procedure: PACEMAKER IMPLANT;  Surgeon: Waddell Danelle ORN, MD;  Location: [facility];  Service: Cardiovascular;  Laterality: N/A;   SVT ABLATION N/A 11/15/2018   Procedure: SVT ABLATION;  Surgeon: Waddell Danelle ORN, MD;  Location: [facility];  Service: Cardiovascular;  Laterality: N/A;   TRANSTHORACIC ECHOCARDIOGRAM  09/13/2012   moderate  LVH,  ef 60-65%/     TRANSURETHRAL RESECTION OF BLADDER TUMOR N/A 08/11/2016   Procedure: TRANSURETHRAL RESECTION OF BLADDER TUMOR (TURBT);  Surgeon: Belvie LITTIE Clara, MD;  Location: [facility];  Service: Urology;  Laterality: N/A;   HPI:  Dustin Hamilton is a 83 y.o. male with medical history significant of hypertension, hyperlipidemia, TIA, CVA, amyloid angiopathy, paroxysmal SVT status post ablation, complete heart block status post pacemaker, secondary parkinsonism, dementia, depression, OSA, gout, BPH, glaucoma, chronic pain presenting with left-sided deficits as a code stroke. MRI revealed 1. Acute infarct in the right basal ganglia.  2.  Remote left PCA territory infarcts and additional small remote infarcts in  the right occipital cortex within the right PCA territory.     Recommendation/Prognosis  Clinical Impression:   Clinical Impression: Patient presents with moderate oropharyngeal dysphagia impacted primarily by cognition and reduced sensation. Upon entry of scope, noted mild erythema diffusely and asymmetrical vocal folds with left vocal fold appearing less engaged than right, though patient achieved adduction bilaterally. During non-swallowing task, patient approximated vocal folds and voiced when cued to cough. SLP administered ice chips, thin liquids, and pudding textures. Patient demonstrated oral holding and premature spillage across all items, requiring mod to max cues to initiate swallow. Bolus material was observed to fall posteriorly into the pyriform sinuses and sit there in some instances for 15+ seconds prior to swallow initiaiton (preceeded by max cues). After the swallow, patient demonstrated mild to moderate residue across all textures with residue amount increasing alongside bolus viscosity. With thin liquid from teaspoon, no instances of penetration/aspiration were appreciated, however with cup sips, patient aspirated x2 with varying degrees of sensation. During first aspiration event, patient aspirated residue posteriorly through the interarytenoid space, though coughed and cleared material in its entirely. Upon final administration of thin liquids, patient aspirated before/during the swallow with no cough response exhibited. Recommend continuation of NPO, though appropriate for floor stock of purees and teaspoons of thin liquids administered from staff or trained family and only when patient is alert. Continue to administer medications via alternative means. SLP Visit Diagnosis: Dysphagia, oropharyngeal phase (R13.12) Impact on safety and function: Severe aspiration risk;Moderate aspiration risk   Swallow  Evaluation Recommendations:  SLP Diet Recommendations: NPO (floor stock of purees and teaspoons of thin liquids) Liquid Administration via: Spoon Medication Administration: Via alternative means Supervision: Full assist for feeding Postural Changes: Seated upright at 90 degrees    Prognosis:  Prognosis for improved oropharyngeal function: Fair Barriers to Reach Goals: Cognitive deficits;Severity of deficits   Individuals Consulted: Consulted and Agree with Results and Recommendations: Patient;RN;MD Report Sent to : Referring physician      SLP Assessment/Plan Plan:  Oral Care Recommendations: Oral care QID Caregiver Recommendations: Have oral suction available Treatment Recommendations: Therapy as outlined in treatment plan below Follow Up Recommendations: Home health SLP Functional Status Assessment: Patient has had a recent decline in their functional status and demonstrates the ability to make significant improvements in function in a reasonable and predictable amount of time. Speech Therapy Frequency (ACUTE ONLY): min 2x/week Treatment Duration: 4 weeks Interventions: Patient/family education;Trials of upgraded texture/liquids      General: Date of Onset: 12/30/23 Type of Study: FEES-Fiberoptic Endoscopic Evaluation of Swallow Previous Swallow Assessment: 8/1 Diet Prior to this Study: NPO Temperature Spikes Noted: No History of Recent Intubation: No Oral Cavity - Dentition: Adequate natural dentition Self-Feeding Abilities: Needs assist Baseline Vocal Quality: Hoarse Volitional Cough: Cognitively unable to elicit Volitional Swallow: Able  to elicit (only able to elicit intermittently) Anatomy: Within functional limits Pharyngeal Secretions: Normal   Rosina Downy, M.A., CCC-SLP   Desmen Schoffstall A Eustace Hur 01/25/2024, 3:11 PM

## 2024-01-25 NOTE — Progress Notes (Signed)
 Speech Language Pathology Daily Session Note  Patient Details  Name: LADARIEN BEEKS MRN: [id number] Date of Birth: [birth year]  Today's Date: 01/25/2024 SLP Individual Time: 0830-0910 SLP Individual Time Calculation (min): 40 min  Short Term Goals: Week 3: SLP Short Term Goal 1 (Week 3): STG = LTG due to ELOS  Skilled Therapeutic Interventions: SLP conducted skilled therapy session targeting swallowing and communication goals. SLP provided explanation to patient and wife (present via telephone) re: FEES swallow evaluation planned for this afternoon. Wife provided consent for procedure over the telephone, and form was placed in patient paper chart. SLP then provided max assist for oral care via suction toothbrush. During ice chip trials, patient exhibited delayed cough in 2/3 attempts. SLP administered bites of applesauce x1 noting bolus holding and anterior spillage. Patient benefited from mod to max cues to initiate swallow. After this trial, patient refused further bolus presentations. Patient answered basic yes/no orientation questions with 80% accuracy with nodding/shaking head. When asked to verbalize responses, patient declined though several verbalizations were observed volitionally throughout session. Facilitated increased verbal participation through introduction of music, though no verbalizations/vocalizations were provided by patient throughout attempts.  Patient was left in room with call bell in reach and alarm set. SLP will continue to target goals per plan of care.        Pain Pain Assessment Pain Scale: Faces Pain Score: 0-No pain  Therapy/Group: Individual Therapy  Jaylee Lantry, M.A., CCC-SLP  Bao Coreas A Aften Lipsey 01/25/2024, 11:10 AM

## 2024-01-26 LAB — GLUCOSE, CAPILLARY
Glucose-Capillary: 138 mg/dL — ABNORMAL HIGH (ref 70–99)
Glucose-Capillary: 126 mg/dL — ABNORMAL HIGH (ref 70–99)
Glucose-Capillary: 127 mg/dL — ABNORMAL HIGH (ref 70–99)
Glucose-Capillary: 161 mg/dL — ABNORMAL HIGH (ref 70–99)

## 2024-01-26 MED ORDER — MELATONIN 3 MG PO TABS: 3.0000 mg | ORAL_TABLET | Freq: Every day | ORAL | Status: DC

## 2024-01-26 MED ORDER — FREE WATER: 300.0000 mL | Freq: Three times a day (TID) | Status: AC

## 2024-01-26 MED ORDER — JEVITY 1.5 CAL/FIBER PO LIQD: 237.0000 mL | Freq: Every day | ORAL | Status: AC

## 2024-01-26 MED ORDER — MEMANTINE HCL 10 MG PO TABS: 10.0000 mg | ORAL_TABLET | Freq: Two times a day (BID) | ORAL | Status: DC

## 2024-01-26 NOTE — Progress Notes (Signed)
 PROGRESS NOTE   Subjective/Complaints:  No new complaints or concerns elicited.   ROS- limited by cognition  Objective:   No results found.   Recent Labs    01/25/24 0516  WBC 7.0  HGB 14.9  HCT 43.8  PLT 209      Recent Labs    01/25/24 0516  NA 139  K 4.3  CL 107  CO2 25  GLUCOSE 89  BUN 36*  CREATININE 1.15  CALCIUM  9.7       Intake/Output Summary (Last 24 hours) at 01/26/2024 1334 Last data filed at 01/26/2024 1200 Gross per 24 hour  Intake 800 ml  Output 550 ml  Net 250 ml        Physical Exam: Vital Signs Blood pressure 128/79, pulse 94, temperature 98.3 F (36.8 C), resp. rate 17, height 5' 11 (1.803 m), weight 55 kg, SpO2 97%.  General: No acute distress, laying in bed, drowsy this AM Mood and affect flat Heart: Regular rate and rhythm no rubs murmurs or extra sounds Lungs: Clear to auscultation bilaterally though poor inspiratory effort, nonlabored breathing Abdomen: Positive bowel sounds, soft nontender to palpation, nondistended PEG tube in place- looks ok, no tenderness noted around PEG tube site Extremities: No clubbing, cyanosis, or edema, TEDs donned Skin: No breakdown noted over visible surfaces Neuro: Very drowsy, wakes to voice, delayed response, limited verbal output  Neurologic: masked facies, eyes closed , no spontaneous movement .Sensory exam limited by attention and concentration   Musculoskeletal: no pain with hip or knee ROM BLE   Prior exam  motor strength is 4/5 in right deltoid, bicep, tricep, grip, 2-/5 LUE and LLE- MMT varies based on LOA - lost focus during LE MMT with limited attempt despite cuing   Assessment/Plan: 1. Functional deficits which require 3+ hours per day of interdisciplinary therapy in a comprehensive inpatient rehab setting. Physiatrist is providing close team supervision and 24 hour management of active medical problems listed  below. Physiatrist and rehab team continue to assess barriers to discharge/monitor patient progress toward functional and medical goals  Care Tool:  Bathing    Body parts bathed by patient: Face   Body parts bathed by helper: Left arm, Abdomen, Chest, Right arm, Right upper leg, Left upper leg     Bathing assist Assist Level: 2 Helpers     Upper Body Dressing/Undressing Upper body dressing   What is the patient wearing?: Pull over shirt    Upper body assist Assist Level: 2 Helpers    Lower Body Dressing/Undressing Lower body dressing      What is the patient wearing?: Underwear/pull up, Pants     Lower body assist Assist for lower body dressing: 2 Helpers     Toileting Toileting    Toileting assist Assist for toileting: Dependent - Patient 0%     Transfers Chair/bed transfer  Transfers assist  Chair/bed transfer activity did not occur: Safety/medical concerns  Chair/bed transfer assist level: Total Assistance - Patient < 25%     Locomotion Ambulation   Ambulation assist   Ambulation activity did not occur: Safety/medical concerns  Assist level: 2 helpers Assistive device: Other (comment) (3 musketeer support) Max distance:  6 ft   Walk 10 feet activity   Assist  Walk 10 feet activity did not occur: Safety/medical concerns        Walk 50 feet activity   Assist Walk 50 feet with 2 turns activity did not occur: Safety/medical concerns         Walk 150 feet activity   Assist Walk 150 feet activity did not occur: Safety/medical concerns         Walk 10 feet on uneven surface  activity   Assist Walk 10 feet on uneven surfaces activity did not occur: Safety/medical concerns         Wheelchair     Assist Is the patient using a wheelchair?: Yes Type of Wheelchair: Manual Wheelchair activity did not occur: Safety/medical concerns  Wheelchair assist level: Dependent - Patient 0%      Wheelchair 50 feet with 2 turns  activity    Assist    Wheelchair 50 feet with 2 turns activity did not occur: Safety/medical concerns   Assist Level: Dependent - Patient 0%   Wheelchair 150 feet activity     Assist  Wheelchair 150 feet activity did not occur: Safety/medical concerns   Assist Level: Dependent - Patient 0%   Blood pressure 128/79, pulse 94, temperature 98.3 F (36.8 C), resp. rate 17, height 5' 11 (1.803 m), weight 55 kg, SpO2 97%.  Medical Problem List and Plan: 1. 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  Will discuss further with team tomorrow team meeting -Team conference today please see physician documentation under team conference tab, met with team  to discuss problems,progress, and goals. Formulized individual treatment plan based on medical history, underlying problem and comorbidities.  - Patient's wife is still waiting on Tis wheelchair and tube feeding supplies.  She is also still working on getting the daytime caregiver. -Social work reports TIS chair to be here Wednesday, still working on tube feedings -Team conference tomorrow, wife still working on getting tube feedings  2.  Antithrombotics: -DVT/anticoagulation:  Pharmaceutical: Lovenox  40mg  daily             -antiplatelet therapy: ASA/Plavix  X 3 weeks followed by Plavix  alone Plavix  and aspirin  held for PEG-- Restart   3. Pain Management: Tylenol  prn.  4. Mood/Behavior/Sleep: LCSW to follow for evaluation and support.             --hx of sundowning-->continue melatonin 3 mg/Monitor sleep hygeine             -antipsychotic agents: N/A -Reduced level of  alertness trial amantadine - no significant changes will trial methylphenidate  10mg  BID starting noon 8/11 -8/15 patient reported to be a little more alert working with OT this morning.  Continue current regimen -8/20 Continue methylphenidate  10 mg twice daily -01/24/24 much more alert today! Actually has a conversation with me today -8/26 Drowsy this AM, later he was more alert  participating with therapy 5. Neuropsych/cognition: This patient is not capable of making decisions  6. Skin/Wound Care:  Routine pressure relief measures.  7. Fluids/Electrolytes/Nutrition: Continue NPO due  to holding of orals.              --on tube feeds.  Appreciate dietitian assistance. 8. HTN: Monitor BP TID--goal 150 to avoid symptoms (multiple ED visits this year)             --hx of chronic dizziness. Needs to stay hydrated.  -8/22-26 well-controlled overall continue current regimen Vitals:   01/22/24 2052 01/23/24 0453 01/23/24 1538 01/23/24 2046  BP: 112/81 101/75 119/78 (!) 144/93   01/24/24 0453 01/24/24 1656 01/24/24 1945 01/25/24 0509  BP: 124/81 (!) 112/90 (!) 119/96 97/62   01/25/24 1417 01/25/24 2003 01/26/24 0525 01/26/24 1306  BP: 117/89 106/79 110/79 128/79    9. H/o SVT/CHF s/p PPM/syncope: Monitor for orthostatic symptoms   10. BPH/urinary retention: Urecholine  and Proscar  cannot be crushed per pharmacy so will hold (cannot use Avodart  capsule due to TF).  -01/09/24 PVR documented once, 66; monitor; of note, getting urocholine 10mg  TID... -01/10/24 low PVRs but not many documented -01/17/24 poor documentation of PVRs, but using condom cath and output looks ok. Monitor.  -8/19 has not required recent IC, bladder scan volumes not particularly elevated.   -8/20 bladder scan 0-230 continue to monitor  - 8/22-25 incontinence present but not having retention requiring IC -8/26 medication options limited by Tube feeding / NPO  11. 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 Glaucoma: Resume home gtts Alphagan , Trusopt , Xalatan . 13. Dysphagia: NPO with tube feeds. May need PEG for nutritional support. BUN up will increase free H20  - Patient has IR consult for PEG, CT scan completed  - PEG for tomorrow scheduled.  Tube feeds and free water  held at midnight.  - 8/19 PEG placed today, IR indicates diet may be advanced tomorrow.  Will start gentle IVF NS for 24h   --8/20 Tube feeding starting today, Will not continue IVF after this afternoon   -8/21 continue tube feedings with PEG  8/22 patient's wife is still working on getting home tube feeding formula delivered  FEES yesterday, continue NPO 14. Acute renal failure: Improved from 24/1.36 to 27/0.99>> 30/0.89  - 8/14 improved at creatinine 0.79/BUN 30  -8/18 BUN/creatinine stable at 32/0.83   -8/20 Free water  restarted by PEG today   - 8/21 BUN/creatinine stable  -8/25 Increase free water   15. Chronic Thrombocytopenia: stable/improving    -8/25 stable 209    Latest Ref Rng & Units 01/25/2024    5:16 AM 01/21/2024    5:30 AM 01/18/2024    6:16 AM  CBC  WBC 4.0 - 10.5 K/uL 7.0  6.6  5.6   Hemoglobin 13.0 - 17.0 g/dL 85.0  85.2  86.0   Hematocrit 39.0 - 52.0 % 43.8  43.7  42.1   Platelets 150 - 400 K/uL 209  221  213      16.  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  UA + WBC but only rare bact, treat empirically keflex  500mg  BID x 3 d to see if this improves MS -No change in cognition noted  Urine blood tinged yesterday , monitor Hgb ok  Hgb is normal , mildly low plt also on asa , plavix  and enoxaparin , will monitor  May need to hold enoxaparin   8/15 urine recorded is yellow/straw, continue to monitor  18. Hx of gout: allopurinol  100mg  daily   LOS: 22 days A  FACE TO FACE EVALUATION WAS PERFORMED  Dustin Hamilton 01/26/2024, 1:34 PM

## 2024-01-26 NOTE — Progress Notes (Signed)
 Occupational Therapy Session Note  Patient Details  Name: TYQUAN CARMICKLE MRN: [id number] Date of Birth: 1940/07/06  {CHL IP REHAB OT TIME CALCULATIONS:[id number]}   Short Term Goals: Week 3:  OT Short Term Goal 1 (Week 3): STG=LTG d/t ELOS  Skilled Therapeutic Interventions/Progress Updates:     Pt received *** presenting to be in good spirits receptive to skilled OT session reporting ***/10 pain- OT offering intermittent rest breaks, repositioning, and therapeutic support to optimize participation in therapy session.   Pt was left resting in *** with call bell in reach, *** alarm on, and all needs met.    Therapy Documentation Precautions:  Precautions Precautions: Fall Recall of Precautions/Restrictions: Impaired Precaution/Restrictions Comments: L hemipareisis, NPO, new PEG placement Restrictions Weight Bearing Restrictions Per Provider Order: No   Therapy/Group: Individual Therapy  Katheryn SHAUNNA Mines 01/26/2024, 7:13 AM

## 2024-01-26 NOTE — Progress Notes (Signed)
 Patient ID: Dustin Hamilton, male   DOB: Oct 29, 1940, 83 y.o.   MRN: [id number]  Met with wife and hired aide in the room to let wife know she will ned to go to [facility] to pick up the tube feeding they are getting in tomorrow. Then the other will be sent to their home. It is not one in stock so had to specially order it. Wheelchair to be delivered to home by tomorrow. Have discussed hope to discharge ept home by Friday 8/29. She will get things ready and go to pharmacy to pick up tube feedings. Wife and aide in agreement, aware will be non-emergency transport home

## 2024-01-26 NOTE — Progress Notes (Signed)
 Speech Language Pathology Daily Session Note  Patient Details  Name: Dustin Hamilton MRN: [id number] Date of Birth: [birth year]  Today's Date: 01/26/2024 SLP Individual Time: 0234-0329 SLP Individual Time Calculation (min): 55 min  Short Term Goals: Week 3: SLP Short Term Goal 1 (Week 3): STG = LTG due to ELOS  Skilled Therapeutic Interventions:  Patient was seen in PM to address dysphagia management and language. Pt was alert and seen at bedside. His wife was present and participated intermittently throughout session. Assigned NT assisted SLP in repositioning pt upright. SLP completed thorough oral hygiene in preparation for PO trials. SLP reviewed results of FEES completed on previous date and discussed plan of action for today's session. Pt presented with small single sips of water , ~1/3 tsp. Max cueing for bolus stripping with some return this date in 2/4 trials. Max cues for swallow initiation though suspect some delayed swallow initiation given time from administration and eventual swallow. Pt did remain without overt s/s aspiration though can not r/o silent aspiration. Pt also presented with puree trials where he warranted mod to max cues for manipulation and subsequently swallow initiation. Pt with cough after swallow in 2/2 trials. Further trials discontinued. In other minutes of session, SLP engaged pt in functional language. Pt appropriately responded to SLP greetings in the beginning of session though minimal language output observed in remainder of session. He verbalized his name, DOB, and family member names. SLP attempted to engage pt in dialogue about a preferred hobby, photography. Though pt warranting max A to identify subjects photographed. At conclusion of session, pt was left upright in bed with wife present. SLP to continue POC.   Pain Pain Assessment Pain Scale: 0-10 Pain Score: 0-No pain  Therapy/Group: Individual Therapy  [name] 01/26/2024, 3:41 PM

## 2024-01-26 NOTE — Plan of Care (Signed)
  Problem: Consults Goal: RH STROKE PATIENT EDUCATION Description: See Patient Education module for education specifics  Outcome: Progressing   Problem: RH BOWEL ELIMINATION Goal: RH STG MANAGE BOWEL WITH ASSISTANCE Description: STG Manage Bowel with toileting min Assistance. Outcome: Progressing Goal: RH STG MANAGE BOWEL W/MEDICATION W/ASSISTANCE Description: STG Manage Bowel with Medication with mod I Assistance. Outcome: Progressing   Problem: RH BLADDER ELIMINATION Goal: RH STG MANAGE BLADDER WITH ASSISTANCE Description: STG Manage Bladder With min Assistance Outcome: Progressing Goal: RH STG MANAGE BLADDER WITH MEDICATION WITH ASSISTANCE Description: STG Manage Bladder With Medication With mod I Assistance. Outcome: Progressing   Problem: RH SAFETY Goal: RH STG ADHERE TO SAFETY PRECAUTIONS W/ASSISTANCE/DEVICE Description: STG Adhere to Safety Precautions With cues Assistance/Device. Outcome: Progressing   Problem: RH COGNITION-NURSING Goal: RH STG USES MEMORY AIDS/STRATEGIES W/ASSIST TO PROBLEM SOLVE Description: STG Uses Memory Aids/Strategies With min Assistance to Problem Solve. Outcome: Progressing   Problem: RH PAIN MANAGEMENT Goal: RH STG PAIN MANAGED AT OR BELOW [name] PAIN GOAL Description: Pain < 4 with prns Outcome: Progressing   Problem: RH KNOWLEDGE DEFICIT Goal: RH STG INCREASE KNOWLEDGE OF HYPERTENSION Description: Patient and wife will be able to manage HTN using educational resources for medications and dietary modification independently Outcome: Progressing Goal: RH STG INCREASE KNOWLEDGE OF DYSPHAGIA/FLUID INTAKE Description: Patient and wife will be able to manage dysphagia using educational resources for medications and dietary modification independentl Outcome: Progressing Goal: RH STG INCREASE KNOWLEDGE OF STROKE PROPHYLAXIS Description: Patient and wife will be able to manage secondary risks using educational resources for medications and dietary  modification independentl Outcome: Progressing

## 2024-01-27 LAB — GLUCOSE, CAPILLARY
Glucose-Capillary: 104 mg/dL — ABNORMAL HIGH (ref 70–99)
Glucose-Capillary: 156 mg/dL — ABNORMAL HIGH (ref 70–99)
Glucose-Capillary: 159 mg/dL — ABNORMAL HIGH (ref 70–99)
Glucose-Capillary: 145 mg/dL — ABNORMAL HIGH (ref 70–99)

## 2024-01-27 NOTE — Patient Care Conference (Signed)
 Inpatient RehabilitationTeam Conference and Plan of Care Update Date: 01/27/2024   Time: 10:51 AM    Patient Name: Dustin Hamilton      Medical Record Number: [id number]  Date of Birth: [birth year] Sex: Male         Room/Bed: 5T95R/5T95R-98 Payor Info: Payor: [employer] / Plan: [facility] / Product Type: *No Product type* /    Admit Date/Time:  01/04/2024  3:04 PM  Primary Diagnosis:  Stroke of right basal ganglia [facility])  Hospital Problems: Principal Problem:   Stroke of right basal ganglia (HCC) Active Problems:   Essential hypertension   Thrombocytopenia (HCC)   Heart block   Dementia (HCC)   Mixed hyperlipidemia   Secondary parkinsonism, unspecified (HCC)   Status cardiac pacemaker   Protein-calorie malnutrition, severe   Dysphagia   Aphasia due to acute stroke (HCC)   Prerenal azotemia   Insomnia    Expected Discharge Date: Expected Discharge Date: 01/29/24  Team Members Present: Physician leading conference: Dr. Murray Collier Social Worker Present: Rhoda Clement, LCSW Nurse Present: Barnie Ronde, RN PT Present: Recardo Milliner, PT OT Present: Katheryn Mines, OT SLP Present: Recardo Mole, SLP     Current Status/Progress Goal Weekly Team Focus  Bowel/Bladder   Incontinent,   no skin breakdown   use of condom cath, to protect skin    Swallow/Nutrition/ Hydration   NPO - FEES completed 8/25 - moderate oropharyngeal dysphagia impacted primarily by cognition and reduced sensation   modA  PO trials, pt/family education    ADL's   MAX UB dressing, MAX +2 for all other ADLs, MAX +2 squat pivot transfers; ADLs completed bed level // Barriers: improved alertness and task initation over past week with some improved initation, limited functional cognitiion, poor awareness, tone   Downgraded to MAX A   task initiation, sitting balance, NMR, family/cargiver education, familiar functional activities    Mobility   Continues at MaxA +2/ TotA  for all mobility   Max/ TotA overall  arriers: lethargy, hemiplegia /// Work on: all mobility LOA and initiation, alertness, seated balance, NMR, family education    Communication   severe to profound expressive/receptive language deficits remain   modA   multimodal communication, Y/N questions, commands, pt/family education    Safety/Cognition/ Behavioral Observations  mod-severe attention deficits   modA   cognitive re training, pt/family edu    Pain   no pain   no pain   no pain    Skin   skin breakdown   keep pt dry and turns on  no further skin breakdown      Discharge Planning:  WC to be delivered to home today and wife to go to [facility] to pick up tube feediing formula. Plan to DC Friday via non-emergency ambulance. 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 Progress limited by limited alertness, tracking deficits, unable to move from midline, and unable to follow instructions. Note improved task initiation and participation with self care an increased alertness however, progress limited by evere/profound cognitive deficits, and dementia.  Patient on target to meet rehab goals: Patient is medically stable per MD; discharge pending delivery of supplies/equipment. Patient currently needs max assist for ADLs at bed level. Requires max assist +2 for squat pivot transfers.   *See Care Plan and progress notes for long and short-term goals.   Revisions to Treatment Plan:  N/a  Teaching Needs: Safety, skin care, PEG care, medications via PEG, tube feedings, transfers, toileting, etc   Current Barriers to Discharge: Decreased caregiver support, Home enviroment access/layout, and Incontinence  Possible Resolutions to Barriers: Family education     Medical Summary Current Status: R basal ganlia infarct, HTN, Dysphagia,  AKI, urinary retention,dementia  Barriers to Discharge: Renal Insufficiency/Failure;Incontinence;Self-care education;Medical stability  Barriers to Discharge Comments: R basal ganlia infarct, HTN, Dysphagia, AKI, urinary retention,dementia Possible Resolutions to [employer]: Tube feeds, monitor bladder function, monitor BPs, continue ritalin    Continued Need for Acute Rehabilitation Level of Care: The patient requires daily medical management by a physician with specialized training in physical medicine and rehabilitation for the following reasons: Direction of a multidisciplinary physical rehabilitation program to maximize functional independence : Yes Medical management of patient stability for increased activity during participation in an intensive rehabilitation regime.: Yes Analysis of laboratory values and/or radiology reports with any subsequent need for medication adjustment and/or medical intervention. : Yes   I attest that I was present, lead the team conference, and concur with the assessment and plan of the team.   Fredericka Sober B 01/27/2024, 3:07 PM

## 2024-01-27 NOTE — Progress Notes (Signed)
 Patient ID: Dustin Hamilton, male   DOB: [birth year], 83 y.o.   MRN: [id number] Spoke with wife regarding team conference progress this week and plan for discharge Friday home. She is awaiting the TIS wheelchair and will go to [facility] to pick up the tube feedings. Will touch base tomorrow with wife regarding time to set up ambulance home

## 2024-01-27 NOTE — Progress Notes (Signed)
 Physical Therapy Session Note  Patient Details  Name: Dustin Hamilton MRN: [id number] Date of Birth: 1940/12/22  Today's Date: 01/27/2024 PT Individual Time: 1302-1400 PT Individual Time Calculation (min): 58 min   Short Term Goals: Week 2:  PT Short Term Goal 1 (Week 2): Pt will complete bed mobility with MaxA +1. PT Short Term Goal 1 - Progress (Week 2): Progressing toward goal PT Short Term Goal 2 (Week 2): Pt will perform sit<>stand with MaxA +1. PT Short Term Goal 2 - Progress (Week 2): Progressing toward goal PT Short Term Goal 3 (Week 2): Pt will perform seat to seat pivot transfers with MaxA +2. PT Short Term Goal 3 - Progress (Week 2): Met PT Short Term Goal 4 (Week 2): Pt will maintain sitting balance with vc and self corrections and overall MinA for more than . PT Short Term Goal 4 - Progress (Week 2): Not met Week 3:  PT Short Term Goal 1 (Week 3): STG = LTG d/t ELOS  Skilled Therapeutic Interventions/Progress Updates:  Patient seated upright in TIS w/c on entrance to room. Patient alert and agreeable to PT session.   Patient with no pain complaint at start of session.  Therapeutic Activity: Requires change of shirt d/t sweating. Doffed TotA and pt cleaned with cool washcloth over trunk and face. Donned new shirt TotA.   Neuromuscular Re-ed: NMR facilitated during session with focus on standing balance/ tolerance and initiation of voluntary arm movement. Pt guided in stance in standing frame and then small blocks placed in front of him. Is able to relate with head nods/ shakes whether blocks are same color or not and whether they are same shape or not. However when requested to grasp particular color, pt only reaches with RUE to block closest to hand on R. But the moves to stack on top of block just to the L of central vision field. Is unable to move hand to another block despite attempts to change his eye direction. But then placed block in therapist's hand directly in  front of him and then can pinch grasp and place on top of stack. Attempted to start a separate stack to R of initial stack but he continues to [employer] on [employer]. Pt is able to stand for >13 min with task and looking at different items out of window. Does not relate pain or discomfort.   NMR performed for improvements in motor control and coordination, balance, sequencing, judgement, and self confidence/ efficacy in performing all aspects of mobility at highest level of independence.   When asked, pt declines return to bed. Patient seated upright in TIS w/c at end of session with brakes locked, belt alarm set, and all needs within reach. Evening caregiver is in room.    Therapy Documentation Precautions:  Precautions Precautions: Fall Recall of Precautions/Restrictions: Impaired Precaution/Restrictions Comments: L hemipareisis, NPO, new PEG placement Restrictions Weight Bearing Restrictions Per Provider Order: (P) No  Pain:  No pain related this session.   Therapy/Group: Individual Therapy  Mliss DELENA Milliner PT, DPT, CSRS 01/27/2024, 3:28 PM

## 2024-01-27 NOTE — Progress Notes (Signed)
 PROGRESS NOTE   Subjective/Complaints:  No new complaint or concerns elicited this morning.   ROS- limited by cognition  Objective:   No results found.   Recent Labs    01/25/24 0516  WBC 7.0  HGB 14.9  HCT 43.8  PLT 209      Recent Labs    01/25/24 0516  NA 139  K 4.3  CL 107  CO2 25  GLUCOSE 89  BUN 36*  CREATININE 1.15  CALCIUM  9.7       Intake/Output Summary (Last 24 hours) at 01/27/2024 1053 Last data filed at 01/27/2024 1002 Gross per 24 hour  Intake 1200 ml  Output 1425 ml  Net -225 ml        Physical Exam: Vital Signs Blood pressure 103/72, pulse 82, temperature 97.7 F (36.5 C), temperature source Oral, resp. rate 18, height 5' 11 (1.803 m), weight 55 kg, SpO2 97%.  General: No acute distress, laying in bed, drowsy this AM Mood and affect flat Heart: Regular rate and rhythm no rubs murmurs or extra sounds Lungs: Clear to auscultation bilaterally though poor inspiratory effort, nonlabored breathing Abdomen: Positive bowel sounds, soft nontender to palpation, nondistended PEG tube in place- looks ok, no tenderness noted around PEG tube site Extremities: No clubbing, cyanosis, or edema, TEDs donned Skin: No breakdown noted over visible surfaces Neuro: Drowsy but opens eyes to voice, delayed response, limited verbal output  Neurologic: masked facies, eyes closed , no spontaneous movement .Sensory exam limited by attention and concentration   Musculoskeletal: no pain with hip or knee ROM BLE   Prior exam  motor strength is 4/5 in right deltoid, bicep, tricep, grip, 2-/5 LUE and LLE- MMT varies based on LOA - lost focus during LE MMT with limited attempt despite cuing   Assessment/Plan: 1. Functional deficits which require 3+ hours per day of interdisciplinary therapy in a comprehensive inpatient rehab setting. Physiatrist is providing close team supervision and 24 hour management  of active medical problems listed below. Physiatrist and rehab team continue to assess barriers to discharge/monitor patient progress toward functional and medical goals  Care Tool:  Bathing    Body parts bathed by patient: Face   Body parts bathed by helper: Left arm, Abdomen, Chest, Right arm, Right upper leg, Left upper leg     Bathing assist Assist Level: 2 Helpers     Upper Body Dressing/Undressing Upper body dressing   What is the patient wearing?: Pull over shirt    Upper body assist Assist Level: 2 Helpers    Lower Body Dressing/Undressing Lower body dressing      What is the patient wearing?: Underwear/pull up, Pants     Lower body assist Assist for lower body dressing: 2 Helpers     Toileting Toileting    Toileting assist Assist for toileting: Dependent - Patient 0%     Transfers Chair/bed transfer  Transfers assist  Chair/bed transfer activity did not occur: Safety/medical concerns  Chair/bed transfer assist level: Total Assistance - Patient < 25%     Locomotion Ambulation   Ambulation assist   Ambulation activity did not occur: Safety/medical concerns  Assist level: 2 helpers Assistive device: Other (  comment) (3 musketeer support) Max distance: 6 ft   Walk 10 feet activity   Assist  Walk 10 feet activity did not occur: Safety/medical concerns        Walk 50 feet activity   Assist Walk 50 feet with 2 turns activity did not occur: Safety/medical concerns         Walk 150 feet activity   Assist Walk 150 feet activity did not occur: Safety/medical concerns         Walk 10 feet on uneven surface  activity   Assist Walk 10 feet on uneven surfaces activity did not occur: Safety/medical concerns         Wheelchair     Assist Is the patient using a wheelchair?: Yes Type of Wheelchair: Manual Wheelchair activity did not occur: Safety/medical concerns  Wheelchair assist level: Dependent - Patient 0%       Wheelchair 50 feet with 2 turns activity    Assist    Wheelchair 50 feet with 2 turns activity did not occur: Safety/medical concerns   Assist Level: Dependent - Patient 0%   Wheelchair 150 feet activity     Assist  Wheelchair 150 feet activity did not occur: Safety/medical concerns   Assist Level: Dependent - Patient 0%   Blood pressure 103/72, pulse 82, temperature 97.7 F (36.5 C), temperature source Oral, resp. rate 18, height 5' 11 (1.803 m), weight 55 kg, SpO2 97%.  Medical Problem List and Plan: 1. 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  Will discuss further with team tomorrow team meeting -Team conference today please see physician documentation under team conference tab, met with team  to discuss problems,progress, and goals. Formulized individual treatment plan based on medical history, underlying problem and comorbidities.  - Patient's wife is still waiting on Tis wheelchair and tube feeding supplies.  She is also still working on getting the daytime caregiver. -Social work reports TIS chair to be here Wednesday, still working on tube feedings -Wife still working on getting tube feedings - Expected discharge 8/29 -Team conference today please see physician documentation under team conference tab, met with team  to discuss problems,progress, and goals. Formulized individual treatment plan based on medical history, underlying problem and comorbidities.    2.  Antithrombotics: -DVT/anticoagulation:  Pharmaceutical: Lovenox  40mg  daily             -antiplatelet therapy: ASA/Plavix  X 3 weeks  followed by Plavix  alone Plavix  and aspirin  held for PEG-- Restart   3. Pain Management: Tylenol  prn.  4. Mood/Behavior/Sleep: LCSW to follow for evaluation and support.             --hx of sundowning-->continue melatonin 3 mg/Monitor sleep hygeine             -antipsychotic agents: N/A -Reduced level of alertness trial amantadine - no significant changes will trial methylphenidate  10mg  BID starting noon 8/11 -8/15 patient reported to be a little more alert working with OT this morning.  Continue current regimen -8/20 Continue methylphenidate  10 mg twice daily -01/24/24 much more alert today! Actually  has a conversation with me today -8/26 Drowsy this AM, later he was more alert  participating with therapy 5. Neuropsych/cognition: This patient is not capable of making decisions  6. Skin/Wound Care:  Routine pressure relief measures.  7. Fluids/Electrolytes/Nutrition: Continue NPO due to holding of orals.              --on tube feeds.  Appreciate dietitian assistance. 8. HTN: Monitor BP TID--goal 150 to avoid symptoms (multiple ED visits this year)             --hx of chronic dizziness. Needs to stay hydrated.  -8/22-27 well-controlled overall continue current regimen Vitals:   01/23/24 1538 01/23/24 2046 01/24/24 0453 01/24/24 1656  BP: 119/78 (!) 144/93 124/81 (!) 112/90   01/24/24 1945 01/25/24 0509 01/25/24 1417 01/25/24 2003  BP: (!) 119/96 97/62 117/89 106/79   01/26/24 0525 01/26/24 1306 01/26/24 2016 01/27/24 0437  BP: 110/79 128/79 120/88 103/72    9. H/o SVT/CHF s/p PPM/syncope: Monitor for orthostatic symptoms   10. BPH/urinary retention: Urecholine  and Proscar  cannot be crushed per pharmacy so will hold (cannot use Avodart  capsule due to TF).  -01/09/24 PVR documented once, 66; monitor; of note, getting urocholine 10mg  TID... -01/10/24 low PVRs but not many documented -01/17/24 poor documentation of PVRs, but using condom cath and output looks ok. Monitor.  -8/19 has not  required recent IC, bladder scan volumes not particularly elevated.   -8/20 bladder scan 0-230 continue to monitor  - 8/22-25 incontinence present but not having retention requiring IC -8/26 medication options limited by Tube feeding / NPO  11. 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 Glaucoma: Resume home gtts Alphagan , Trusopt , Xalatan . 13. Dysphagia: NPO with tube feeds. May need PEG for nutritional support. BUN up will increase free H20  - Patient has IR consult for PEG, CT scan completed  - PEG for tomorrow scheduled.  Tube feeds and free water  held at midnight.  - 8/19 PEG placed today, IR indicates diet may be advanced tomorrow.  Will start gentle IVF NS for 24h   --8/20 Tube feeding starting today, Will not continue IVF after this afternoon   -8/21 continue tube feedings with PEG  8/22 patient's wife is still working on getting home tube feeding formula delivered  8/26FEES yesterday, continue NPO 14. Acute renal failure: Improved from 24/1.36 to 27/0.99>> 30/0.89  - 8/14 improved at creatinine 0.79/BUN 30  -8/18 BUN/creatinine stable at 32/0.83   -8/20 Free water  restarted by PEG today   - 8/21 BUN/creatinine stable  -8/25 Increase free water    Recheck labs tomorrow 15. Chronic Thrombocytopenia: stable/improving    -8/25 stable 209  Recheck tomorrow     Latest Ref Rng & Units 01/25/2024    5:16 AM 01/21/2024    5:30 AM 01/18/2024    6:16 AM  CBC  WBC 4.0 - 10.5 K/uL 7.0  6.6  5.6   Hemoglobin 13.0 - 17.0 g/dL 85.0  85.2  86.0   Hematocrit 39.0 - 52.0 % 43.8  43.7  42.1   Platelets 150 - 400 K/uL 209  221  213      16.  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  UA + WBC but only rare bact, treat empirically keflex  500mg  BID x 3 d to see if this improves MS -No change in cognition noted  Urine blood  tinged yesterday , monitor  Hgb ok  Hgb is normal , mildly low plt also on asa , plavix  and enoxaparin , will monitor  May need to hold enoxaparin   8/15 urine recorded is yellow/straw, continue to monitor  18. Hx of gout: allopurinol  100mg  daily   LOS: 23 days A FACE TO FACE EVALUATION WAS PERFORMED  Murray Collier 01/27/2024, 10:53 AM

## 2024-01-27 NOTE — Progress Notes (Signed)
 Physical Therapy Session Note  Patient Details  Name: Dustin Hamilton MRN: [id number] Date of Birth: 1940-11-07  Today's Date: 01/27/2024 PT Individual Time:  -      Short Term Goals: Week 2:  PT Short Term Goal 1 (Week 2): Pt will complete bed mobility with MaxA +1. PT Short Term Goal 1 - Progress (Week 2): Progressing toward goal PT Short Term Goal 2 (Week 2): Pt will perform sit<>stand with MaxA +1. PT Short Term Goal 2 - Progress (Week 2): Progressing toward goal PT Short Term Goal 3 (Week 2): Pt will perform seat to seat pivot transfers with MaxA +2. PT Short Term Goal 3 - Progress (Week 2): Met PT Short Term Goal 4 (Week 2): Pt will maintain sitting balance with vc and self corrections and overall MinA for more than . PT Short Term Goal 4 - Progress (Week 2): Not met Week 3:  PT Short Term Goal 1 (Week 3): STG = LTG d/t ELOS  Skilled Therapeutic Interventions/Progress Updates:  Patient *** on entrance to room. Patient alert and agreeable to PT session.   Patient with no pain complaint at start of session.  Therapeutic Activity: Bed Mobility: Pt performed supine <> sit with ***. VC/ tc required for ***. Transfers: Pt performed sit<>stand and stand pivot transfers throughout session with ***. Provided vc/ tc for***.  Gait Training:  Pt ambulated *** ft using *** with ***. Demonstrated ***. Provided vc/ tc for ***.  Wheelchair Mobility:  Pt propelled wheelchair *** feet with ***. Provided vc/ tc for ***.  Neuromuscular Re-ed: NMR facilitated during session with focus on ***. Pt guided in ***. NMR performed for improvements in motor control and coordination, balance, sequencing, judgement, and self confidence/ efficacy in performing all aspects of mobility at highest level of independence.   Therapeutic Exercise: Pt performed the following exercises with vc/ tc for proper technique. ***  Patient *** at end of session with brakes locked, *** alarm set, and all needs  within reach.   Therapy Documentation Precautions:  Precautions Precautions: Fall Recall of Precautions/Restrictions: Impaired Precaution/Restrictions Comments: L hemipareisis, NPO, new PEG placement Restrictions Weight Bearing Restrictions Per Provider Order: No  Pain:     Therapy/Group: Individual Therapy  Mliss DELENA Milliner PT, DPT, CSRS 01/26/2024, 5:23 PM

## 2024-01-27 NOTE — Progress Notes (Signed)
 Physical Therapy Discharge Summary  Patient Details  Name: Dustin Hamilton MRN: [id number] Date of Birth: 02/22/1941  Date of Discharge from PT service:January 28, 2024  Today's Date: 01/28/2024   Patient has met 6 of 6 long term goals due to increased strength and improved attention.  Patient to discharge at a wheelchair level Total Assist.   Patient's care partner is independent to provide the necessary physical and cognitive assistance at discharge.  Reasons goals not met: n/a  Recommendation:  Patient will benefit from ongoing skilled PT services in home health setting to continue to advance safe functional mobility, address ongoing impairments in strength, coordination, flexibility, balance, activity tolerance, cognition, overall awareness, and to minimize fall risk.  Equipment: TIS wheelchair  Reasons for discharge: limited progress and reduced expectations for future progress  Patient/family agrees with progress made and goals achieved: Yes  PT Discharge Precautions/Restrictions Precautions Precautions: Fall Precaution/Restrictions Comments: L hemipareisis, NPO, new PEG placement, significant global tone L>R and UB>LB Restrictions Weight Bearing Restrictions Per Provider Order: No Vital Signs Therapy Vitals Temp: 97.8 F (36.6 C) Temp Source: Oral Pulse Rate: 69 Resp: 16 BP: 130/76 Patient Position (if appropriate): Lying Oxygen Therapy SpO2: 98 % O2 Device: Room Air Pain Pain Assessment Pain Scale: 0-10 Pain Score: 0-No pain Pain Interference Pain Interference Pain Effect on Sleep: 1. Rarely or not at all Pain Interference with Therapy Activities: 1. Rarely or not at all Pain Interference with Day-to-Day Activities: 1. Rarely or not at all Vision/Perception  Vision - History Ability to See in Adequate Light: 2 Moderately impaired Vision - Assessment Eye Alignment: Within Functional Limits Ocular Range of Motion: Impaired-to be further tested in  functional context;Other (comment);Restricted on the left Alignment/Gaze Preference: Chin down;Gaze right Tracking/Visual Pursuits: Impaired - to be further tested in functional context Saccades: Impaired - to be further tested in functional context Convergence: Impaired (comment) Perception Perception: Impaired Preception Impairment Details: Inattention/Neglect Perception-Other Comments: L inattention Praxis Praxis: Impaired Praxis Impairment Details: Motor planning;Organization;Initiation Praxis-Other Comments: initiation improved from eval with minimal voluntary movements in available AROM  Cognition Overall Cognitive Status: Difficult to assess Arousal/Alertness: Awake/alert (improved alertness with ritalin  and improved PM>AM) Orientation Level: Oriented to person;Oriented to place Focused Attention: Impaired Sustained Attention: Impaired Memory: Impaired Awareness: Impaired Problem Solving: Impaired Executive Function: Initiating;Self Monitoring;Self Correcting Initiating: Impaired Self Monitoring: Impaired Self Correcting: Impaired Safety/Judgment: Impaired Comments: DTA d/t language deficits and lethargy Sensation Sensation Light Touch: Impaired Detail Light Touch Impaired Details: Impaired LUE;Impaired LLE (improved slightly from eval) Coordination Gross Motor Movements are Fluid and Coordinated: No Fine Motor Movements are Fluid and Coordinated: No Coordination and Movement Description: significant tone L>R and UB>LB Motor  Motor Motor: Abnormal tone;Hemiplegia Motor - Discharge Observations: significant tone in neck and UB into hips, moderate tone in BLE  Mobility Bed Mobility Bed Mobility: Rolling Right;Sit to Supine;Supine to Sit;Rolling Left Rolling Right: Total Assistance - Patient < 25% Rolling Left: Total Assistance - Patient < 25% Supine to Sit: Total Assistance - Patient < 25% Sit to Supine: Dependent - Patient equal 0% Transfers Transfers: Sit to  Stand;Stand to Sit;Stand Pivot Transfers Sit to Stand: Maximal Assistance - Patient 25-49%;2 Helpers Stand to Sit: Maximal Assistance - Patient 25-49%;2 Counsellor Transfers: Total Assistance - Patient < 25%;2 Helpers Transfer (Assistive device): 2 person hand held assist Transfer via Lift Equipment: Stedy;Maximove Locomotion  Gait Ambulation: Yes Gait Assistance: Total Assistance - Patient < 25%;2 Helpers Gait Distance (Feet): 30 Feet Assistive device: 2 person hand  held assist (3 Forensic scientist) Gait Assistance Details: require complete physical assist to maintain upright posture but is able to initiate reciprocal stepping pattern Gait Gait: No Stairs / Additional Locomotion Stairs: No Wheelchair Mobility Wheelchair Mobility: No  Trunk/Postural Assessment  Cervical Assessment Cervical Assessment: Exceptions to [facility] (forward head) Thoracic Assessment Thoracic Assessment: Exceptions to [facility] (rounded shoulders and kyphotic posturing, especially in stance) Lumbar Assessment Lumbar Assessment: Exceptions to [facility] (posterior pelvic tilt) Postural Control Postural Control: Deficits on evaluation Trunk Control: decreased - holds d/t tone with slow movements out of midline Righting Reactions: significantly decreased Protective Responses: significantly decreased  Balance Balance Balance Assessed: Yes Static Sitting Balance Static Sitting - Balance Support: Feet supported Static Sitting - Level of Assistance: 3: Mod assist (d/t extensor tone) Dynamic Sitting Balance Dynamic Sitting - Balance Support: Feet supported Dynamic Sitting - Level of Assistance: 3: Mod assist (is able to move into flexion but then unable to return to upright supported sitting d/t tone (gravity assist)) Sitting balance - Comments: no postural reactions noted Static Standing Balance Static Standing - Balance Support: During functional activity Static Standing - Level of Assistance: 1: +2 Total  assist Extremity Assessment      RLE Assessment RLE Assessment: Exceptions to [facility] Strength Comments: grossly 3-/5 for understood instructions - tone prevents full AROM RLE Tone RLE Tone: Moderate LLE Assessment LLE Assessment: Exceptions to [facility] General Strength Comments: grossly 3-/5 LLE Tone LLE Tone: Severe (into extension)   Mliss DELENA Milliner PT, DPT, CSRS 01/28/2024, 7:08 AM

## 2024-01-27 NOTE — Progress Notes (Signed)
 Occupational Therapy Session Note  Patient Details  Name: Dustin Hamilton MRN: [id number] Date of Birth: [birth year]  Today's Date: 01/27/2024 OT Individual Time: 9054-8969 OT Individual Time Calculation (min): 45 min  and Today's Date: 01/27/2024 OT Missed Time: 15 Minutes Missed Time Reason: Other (comment) (delay in care) And  OT Individual Time: 1105-1131 OT Individual Time Calculation (min): 26 min   Short Term Goals: Week 3:  OT Short Term Goal 1 (Week 3): STG=LTG d/t ELOS  Skilled Therapeutic Interventions/Progress Updates:     Session 1: Pt received sitting up in bed with nursing present in room providing care. Pt lightly sleeping, waking upon OT arrival and requiring increased time to initiate participation. Pt presenting to be tired, receptive to skilled OT session reporting 0/10 pain- OT offering intermittent rest breaks, repositioning, and therapeutic support to optimize participation in therapy session. Pt noted to have had BM in brief. Facilitated increased opportunities to work on rolling in bed during clean up process. Pt able to participate in bending R LE and reaching across midline to bed rail using R UE following verbal and tactile cues. MAX A required to roll to L, total A to roll to R. OT completed posterior peri-care and donned clean brief total A. Donned pants bed level with Pt able to participate by lifting R LE to weave it into pants. L LE weaved total A and pants brought to waist in side lying. Pt transitioned to EOB MAX A +2. Donned shirt EOB with Pt able to initiate weaving B UEs into shirt with MIN HOH A required to initiate, MAX A required to fully weave B UE and bring shirt OH. MAX A required for sitting balance EOB during ADL. Squat pivot EOB > wc total A +2- Pt able to assist by leaning trunk anteriorly towards OT. Pt receptive to stay in up in wc until next session (~30 min). Pt was left resting in TISWC with call bell in reach, seatbelt alarm on, and all needs  met.    Session 2: Pt received sitting up in [facility], awake and alert presenting to be in good spirits receptive to skilled OT session reporting 0/10 pain- OT offering intermittent rest breaks, repositioning, and therapeutic support to optimize participation in therapy session. Engaged Pt in completing oral care at beginning of session using oral care. MOD HOH A required to initiate with R UE with Pt then able to continue task with MOD verbal and tactile cues required to continue task, attention to L side of mouth, and to ensure cleanliness. Positioned Pt at sink with mirror anterior to Pt for visual feedback of body alignment. Engaged Pt in completing sit<>stand for pressure relief and to facilitate WB'ing through B LE. Pt able to complete single stand with MAX A x2 and tolerate standing for ~1 minute with B UE support on sink. Pt able to initiate stand by anteriorly flexing trunk towards trunk and grasping to sink with R UE. Pt receptive to staying up in wc at end of session. Pt was left resting in TISWC with call bell in reach, seat alarm on, and all needs met.    Therapy Documentation Precautions:  Precautions Precautions: Fall Recall of Precautions/Restrictions: Impaired Precaution/Restrictions Comments: L hemipareisis, NPO, new PEG placement Restrictions Weight Bearing Restrictions Per Provider Order: No   Therapy/Group: Individual Therapy  Katheryn SHAUNNA [place]ines 01/27/2024, 7:58 AM

## 2024-01-28 ENCOUNTER — Encounter (HOSPITAL_COMMUNITY): Payer: Self-pay | Admitting: Physical Medicine & Rehabilitation

## 2024-01-28 DIAGNOSIS — N3000 Acute cystitis without hematuria: Secondary | ICD-10-CM

## 2024-01-28 LAB — BASIC METABOLIC PANEL WITH GFR
Anion gap: 10 (ref 5–15)
BUN: 35 mg/dL — ABNORMAL HIGH (ref 8–23)
CO2: 24 mmol/L (ref 22–32)
Calcium: 9.6 mg/dL (ref 8.9–10.3)
Chloride: 104 mmol/L (ref 98–111)
Creatinine, Ser: 0.84 mg/dL (ref 0.61–1.24)
GFR, Estimated: >60 mL/min (ref >60)
Glucose, Bld: 97 mg/dL (ref 70–99)
Potassium: 4.6 mmol/L (ref 3.5–5.1)
Sodium: 138 mmol/L (ref 135–145)

## 2024-01-28 LAB — CBC
HCT: 41.3 % (ref 39.0–52.0)
Hemoglobin: 13.6 g/dL (ref 13.0–17.0)
MCH: 31.6 pg (ref 26.0–34.0)
MCHC: 32.9 g/dL (ref 30.0–36.0)
MCV: 96 fL (ref 80.0–100.0)
Platelets: 149 K/uL — ABNORMAL LOW (ref 150–400)
RBC: 4.3 MIL/uL (ref 4.22–5.81)
RDW: 14.1 % (ref 11.5–15.5)
WBC: 5.6 K/uL (ref 4.0–10.5)
nRBC: 0.4 % — ABNORMAL HIGH (ref 0.0–0.2)

## 2024-01-28 LAB — URINALYSIS, W/ REFLEX TO CULTURE (INFECTION SUSPECTED)
Bilirubin Urine: NEGATIVE
Glucose, UA: NEGATIVE mg/dL
Ketones, ur: NEGATIVE mg/dL
Nitrite: NEGATIVE
Protein, ur: 100 mg/dL — AB
RBC / HPF: >50 RBC/hpf (ref 0–5)
Specific Gravity, Urine: 1.011 (ref 1.005–1.030)
pH: 8 (ref 5.0–8.0)

## 2024-01-28 LAB — GLUCOSE, CAPILLARY
Glucose-Capillary: 94 mg/dL (ref 70–99)
Glucose-Capillary: 156 mg/dL — ABNORMAL HIGH (ref 70–99)
Glucose-Capillary: 120 mg/dL — ABNORMAL HIGH (ref 70–99)
Glucose-Capillary: 115 mg/dL — ABNORMAL HIGH (ref 70–99)

## 2024-01-28 MED ORDER — METHYLPHENIDATE HCL 10 MG PO TABS: 10.0000 mg | ORAL_TABLET | Freq: Two times a day (BID) | ORAL | 0 refills | Status: DC

## 2024-01-28 MED ORDER — CEPHALEXIN 250 MG PO CAPS
500.0000 mg | ORAL_CAPSULE | Freq: Two times a day (BID) | ORAL | Status: DC
  Administered 2024-01-28 – 2024-01-29 (×2): 500 mg
  Filled 2024-01-28 (×2): qty 2

## 2024-01-28 MED ORDER — ADULT MULTIVITAMIN W/MINERALS CH: 1.0000 | ORAL_TABLET | Freq: Every day | ORAL | Status: AC

## 2024-01-28 MED ORDER — MELATONIN 3 MG PO TABS: 3.0000 mg | ORAL_TABLET | Freq: Every day | ORAL | Status: AC

## 2024-01-28 MED ORDER — CLOPIDOGREL BISULFATE 75 MG PO TABS: 75.0000 mg | ORAL_TABLET | Freq: Every day | ORAL | 0 refills | Status: DC

## 2024-01-28 NOTE — Progress Notes (Signed)
 Urine at this time returning back to being yellow & clear.

## 2024-01-28 NOTE — Progress Notes (Signed)
 PROGRESS NOTE   Subjective/Complaints:  Noted to have some bleeding around G-tube site, urine noted to be pink in color.   ROS- limited by cognition  Objective:   No results found.   Recent Labs    01/28/24 0539  WBC 5.6  HGB 13.6  HCT 41.3  PLT 149*      Recent Labs    01/28/24 0539  NA 138  K 4.6  CL 104  CO2 24  GLUCOSE 97  BUN 35*  CREATININE 0.84  CALCIUM  9.6       Intake/Output Summary (Last 24 hours) at 01/28/2024 1701 Last data filed at 01/28/2024 1659 Gross per 24 hour  Intake 1200 ml  Output 1850 ml  Net -650 ml        Physical Exam: Vital Signs Blood pressure 104/77, pulse 91, temperature 99.1 F (37.3 C), temperature source Oral, resp. rate 17, height 5' 11 (1.803 m), weight 55 kg, SpO2 97%.  General: No acute distress, laying in bed, drowsy this morning mood and affect flat Heart: Regular rate and rhythm no rubs murmurs or extra sounds Lungs: Clear to auscultation bilaterally though poor inspiratory effort, nonlabored breathing Abdomen: Positive bowel sounds, soft nontender to palpation, nondistended PEG tube in place-  no tenderness noted around PEG tube site Extremities: No clubbing, cyanosis, or edema, TEDs donned Skin: No breakdown noted over visible surfaces Neuro: Drowsy but opens eyes to voice, delayed response, minimal verbal output  Neurologic: masked facies, eyes closed , no spontaneous movement .Sensory exam limited by attention and concentration   Musculoskeletal: no pain with hip or knee ROM BLE GU: pink urine in bag Prior neuro assessment is c/w today's exam 01/28/2024.  Prior exam  motor strength is 4/5 in right deltoid, bicep, tricep, grip, 2-/5 LUE and LLE- MMT varies based on LOA - lost focus during LE MMT with limited attempt despite cuing   Assessment/Plan: 1. Functional deficits which require 3+ hours per day of interdisciplinary therapy in a  comprehensive inpatient rehab setting. Physiatrist is providing close team supervision and 24 hour management of active medical problems listed below. Physiatrist and rehab team continue to assess barriers to discharge/monitor patient progress toward functional and medical goals  Care Tool:  Bathing    Body parts bathed by patient: Face, Left arm   Body parts bathed by helper: Left arm, Abdomen, Chest, Right arm, Right upper leg, Left upper leg     Bathing assist Assist Level: Total Assistance - Patient < 25%     Upper Body Dressing/Undressing Upper body dressing   What is the patient wearing?: Pull over shirt    Upper body assist Assist Level: Maximal Assistance - Patient 25 - 49%    Lower Body Dressing/Undressing Lower body dressing      What is the patient wearing?: Underwear/pull up, Pants     Lower body assist Assist for lower body dressing: 2 Helpers     Toileting Toileting    Toileting assist Assist for toileting: Dependent - Patient 0%     Transfers Chair/bed transfer  Transfers assist  Chair/bed transfer activity did not occur: Safety/medical concerns  Chair/bed transfer assist level: 2 Helpers  Locomotion Ambulation   Ambulation assist   Ambulation activity did not occur: Safety/medical concerns  Assist level: 2 helpers Assistive device: Other (comment) (3 musketeer support) Max distance: 6 ft   Walk 10 feet activity   Assist  Walk 10 feet activity did not occur: Safety/medical concerns        Walk 50 feet activity   Assist Walk 50 feet with 2 turns activity did not occur: Safety/medical concerns         Walk 150 feet activity   Assist Walk 150 feet activity did not occur: Safety/medical concerns         Walk 10 feet on uneven surface  activity   Assist Walk 10 feet on uneven surfaces activity did not occur: Safety/medical concerns         Wheelchair     Assist Is the patient using a wheelchair?:  Yes Type of Wheelchair: Manual Wheelchair activity did not occur: Safety/medical concerns  Wheelchair assist level: Dependent - Patient 0%      Wheelchair 50 feet with 2 turns activity    Assist    Wheelchair 50 feet with 2 turns activity did not occur: Safety/medical concerns   Assist Level: Dependent - Patient 0%   Wheelchair 150 feet activity     Assist  Wheelchair 150 feet activity did not occur: Safety/medical concerns   Assist Level: Dependent - Patient 0%   Blood pressure 104/77, pulse 91, temperature 99.1 F (37.3 C), temperature source Oral, resp. rate 17, height 5' 11 (1.803 m), weight 55 kg, SpO2 97%.  Medical Problem List and Plan: 1. 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  Will discuss further with team tomorrow team meeting -Team conference today please see physician documentation under team conference tab, met with team  to discuss problems,progress, and goals. Formulized individual treatment plan based on medical history, underlying problem and comorbidities.  - Patient's wife is still waiting on Tis wheelchair and tube feeding supplies.  She is also still working on getting the daytime caregiver. -Social work reports TIS chair to be here Wednesday, still working on tube feedings -Wife still working on getting tube feedings - Expected discharge 8/29- tomorrow   2.  Antithrombotics: -DVT/anticoagulation:  Pharmaceutical: Lovenox  40mg  daily             -antiplatelet therapy: ASA/Plavix  X 3 weeks followed by Plavix  alone Plavix  and aspirin  held for PEG-- Restart   3. Pain  Management: Tylenol  prn.  4. Mood/Behavior/Sleep: LCSW to follow for evaluation and support.             --hx of sundowning-->continue melatonin 3 mg/Monitor sleep hygeine             -antipsychotic agents: N/A -Reduced level of alertness trial amantadine - no significant changes will trial methylphenidate  10mg  BID starting noon 8/11 -8/15 patient reported to be a little more alert working with OT this morning.  Continue current regimen -8/20 Continue methylphenidate  10 mg twice daily -01/24/24 much more alert today! Actually has a conversation with me today -8/26 Drowsy this  AM, later he was more alert  participating with therapy -8/27 drowsy this morning but appears to have participated in therapy later in the day at baseline level 5. Neuropsych/cognition: This patient is not capable of making decisions  6. Skin/Wound Care:  Routine pressure relief measures.  7. Fluids/Electrolytes/Nutrition: Continue NPO due to holding of orals.              --on tube feeds.  Appreciate dietitian assistance. 8. HTN: Monitor BP TID--goal 150 to avoid symptoms (multiple ED visits this year)             --hx of chronic dizziness. Needs to stay hydrated.  -8/22-28 well-controlled overall continue current regimen Vitals:   01/24/24 1945 01/25/24 0509 01/25/24 1417 01/25/24 2003  BP: (!) 119/96 97/62 117/89 106/79   01/26/24 0525 01/26/24 1306 01/26/24 2016 01/27/24 0437  BP: 110/79 128/79 120/88 103/72   01/27/24 1505 01/27/24 2056 01/28/24 0321 01/28/24 1350  BP: 109/79 101/71 130/76 104/77    9. H/o SVT/CHF s/p PPM/syncope: Monitor for orthostatic symptoms   10. BPH/urinary retention: Urecholine  and Proscar  cannot be crushed per pharmacy so will hold (cannot use Avodart  capsule due to TF).  -01/09/24 PVR documented once, 66; monitor; of note, getting urocholine 10mg  TID... -01/10/24 low PVRs but not many documented -01/17/24 poor documentation of PVRs, but using condom cath and output looks ok. Monitor.   -8/19 has not required recent IC, bladder scan volumes not particularly elevated.   -8/20 bladder scan 0-230 continue to monitor  - 8/22-25 incontinence present but not having retention requiring IC -8/26 medication options limited by Tube feeding / NPO  -8/28 pink urine noted, UA completed indicating potential UTI, Keflex  started 11. 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 Glaucoma: Resume home gtts Alphagan , Trusopt , Xalatan . 13. Dysphagia: NPO with tube feeds. May need PEG for nutritional support. BUN up will increase free H20  - Patient has IR consult for PEG, CT scan completed  - PEG for tomorrow scheduled.  Tube feeds and free water  held at midnight.  - 8/19 PEG placed today, IR indicates diet may be advanced tomorrow.  Will start gentle IVF NS for 24h   --8/20 Tube feeding starting today, Will not continue IVF after this afternoon   -8/21 continue tube feedings with PEG  8/22 patient's wife is still working on getting home tube feeding formula delivered  8/26FEES yesterday, continue NPO  8/28 small amount of bleeding around G-tube site, seen by radiology noted to have small amount of granular tissue without bleeding noted this afternoon, small amount of dried blood.  Radiology applied small amount of silver nitrate 14. Acute renal failure: Improved from 24/1.36 to 27/0.99>> 30/0.89  - 8/14 improved at creatinine 0.79/BUN 30  -8/18 BUN/creatinine stable at 32/0.83   -8/20 Free water  restarted by PEG today   - 8/21 BUN/creatinine stable  -8/25 Increase free water    -8/28 creatinine improved today to 0.84, continue current regimen 15. Chronic Thrombocytopenia: stable/improving    -8/25 stable 209  Platelets a little low today at 149, continue to monitor outpatient with PCP    Latest Ref Rng & Units 01/28/2024    5:39 AM 01/25/2024    5:16 AM 01/21/2024    5:30 AM  CBC  WBC 4.0 - 10.5 K/uL 5.6   7.0  6.6   Hemoglobin 13.0 - 17.0 g/dL 86.3  85.0  85.2   Hematocrit 39.0 -  52.0 % 41.3  43.8  43.7   Platelets 150 - 400 K/uL 149  209  221      16.  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  UA + WBC but only rare bact, treat empirically keflex  500mg  BID x 3 d to see if this improves MS -No change in cognition noted  Urine blood tinged yesterday , monitor Hgb ok  Hgb is normal , mildly low plt also on asa , plavix  and enoxaparin , will monitor  May need to hold enoxaparin   8/15 urine recorded is yellow/straw, continue to monitor  8/28 Keflex  started for UTI as above, monitor cultures  18. Hx of gout: allopurinol  100mg  daily   LOS: 24 days A FACE TO FACE EVALUATION WAS PERFORMED  Murray Collier 01/28/2024, 5:01 PM

## 2024-01-28 NOTE — Progress Notes (Signed)
 Occupational Therapy Discharge Summary  Patient Details  Name: Dustin Hamilton MRN: [id number] Date of Birth: [birth year]  Date of Discharge from OT service:January 29, 2024  Today's Date: 01/28/2024 OT Individual Time: 8696-8654 OT Individual Time Calculation (min): 42 min    Patient has met 4 of 4 long term goals due to improved activity tolerance, improved balance, postural control, improved attention, and improved awareness.  Patient to discharge at [facility] Max Assist level.  Patient's care partner is independent to provide the necessary physical and cognitive assistance at discharge.    Reasons goals not met: All goals met   Recommendation:  Patient will benefit from ongoing skilled OT services in home health setting to continue to advance functional skills in the area of BADL and Reduce care partner burden.  Equipment: Hospital bed, hoyer lift  Reasons for discharge: treatment goals met and discharge from hospital  Patient/family agrees with progress made and goals achieved: Yes  OT Discharge Skilled Therapeutic Interventions/Progress Updates:  Pt received semi-reclined in bed, dressed and ready for the day upon OT arrival. Pt presenting to be alert, in good spirits receptive to skilled OT session reporting 0/10 pain- OT offering intermittent rest breaks, repositioning, and therapeutic support to optimize participation in therapy session. Spent time at beginning of session completing grad day activities and reassessment of [name] functional status (see below documentation). [name] niece present for remainder of session. Provided informal family education on pressure relief methods, techniques for completing bed level ADLs, skin breakdown preventions, techniques for increasing [name] independence during bed mobility and ADLs, and positioning for hemiparetic UE to prevent injury. [name] niece receptive to all education and reporting family feels prepared to d/c. Pt transitioned to EOB total  A. Squat Pivot EOB > TISWC MAX A +2 with Pt able to participate by flexing trunk anteriorly towards therapist in preparation for wc. Engaged Pt in completing oral care using suction toothbrush with MOD A required to attend to L side of mouth and ensure cleanliness. Pt able to wash face using R UE with MIN HOH A required to attend to entirety of face. Incorporated L UE into washing face to facilitate increased attention to L UE with MOD HOH A required to maintain grasp on wash cloth and utilize L UE functionally. Pt was left resting in TISWC with call bell in reach, niece present in room, and all needs met.   Precautions/Restrictions  Precautions Precautions: Fall Recall of Precautions/Restrictions: Impaired Precaution/Restrictions Comments: L hemipareisis, NPO, new PEG placement, significant global tone L>R and UB>LB Restrictions Weight Bearing Restrictions Per Provider Order: No Vital Signs Therapy Vitals Temp: (!) 97.4 F (36.3 C) Temp Source: Oral Pulse Rate: 91 Resp: 17 BP: 104/77 Patient Position (if appropriate): Sitting Oxygen Therapy SpO2: 97 % O2 Device: Room Air Pain Pain Assessment Pain Scale: 0-10 Pain Score: 0-No pain ADL ADL Eating: NPO Grooming: Dependent Where Assessed-Grooming: Wheelchair Upper Body Bathing: Maximal assistance Where Assessed-Upper Body Bathing: Sitting at sink, Chair Lower Body Bathing: Dependent Where Assessed-Lower Body Bathing: Bed level Upper Body Dressing: Maximal assistance Where Assessed-Upper Body Dressing: Edge of bed Lower Body Dressing: Dependent Where Assessed-Lower Body Dressing: Edge of bed Toileting: Dependent Where Assessed-Toileting: Bed level Toilet Transfer: Unable to assess (not safe to attempt transfer) Tub/Shower Transfer: Unable to assess (not safe to attempt transfer) Film/video editor: Not assessed, Unable to assess (not safe to complete transfer) Vision Baseline Vision/History: 3 Glaucoma (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) Vision Assessment?: Vision impaired- to be further  tested in functional context;Yes Eye Alignment: Within Functional Limits Ocular Range of Motion: Impaired-to be further tested in functional context;Restricted on the left Alignment/Gaze Preference: Chin down;Gaze right Tracking/Visual Pursuits: Impaired - to be further tested in functional context (does not follow verbal cues for assessment, tracks therapist laterally to R) Saccades: Impaired - to be further tested in functional context Convergence: Impaired (comment) Visual Fields: Left visual field 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) Depth Perception: Undershoots Perception  Perception: Impaired Perception-Other Comments: L inattention Praxis Praxis: Impaired Praxis Impairment Details: Motor planning;Organization;Initiation Praxis-Other Comments: initiation improved from eval with min voluntary movements in available AROM Cognition Cognition Overall Cognitive Status: Impaired/Different from baseline Arousal/Alertness:  (intermittent lethargy with improved alertness in the evening) Orientation Level: Person;Place;Situation Person: Oriented Place: Disoriented Situation: Disoriented Memory: Impaired Memory Impairment: Retrieval deficit;Storage deficit Attention: Focused;Sustained Focused Attention: Impaired Focused Attention Impairment: Functional basic Sustained Attention: Impaired Sustained Attention Impairment: Functional basic Awareness: Impaired Awareness Impairment: Intellectual impairment Problem Solving: Impaired Problem Solving Impairment: Functional basic Executive Function: Initiating;Self Monitoring;Self Correcting Initiating: Impaired Self Monitoring: Impaired Self Correcting: Impaired Safety/Judgment: Impaired Comments: DTA d/t language deficits and lethargy Brief Interview for Mental Status (BIMS) Repetition of Three Words (First Attempt): 3 Temporal Orientation: Year: No  answer Temporal Orientation: Month: No answer Temporal Orientation: Day: No answer Recall: Sock: No, could not recall Recall: Blue: No, could not recall Recall: Bed: No, could not recall BIMS Summary Score: 3 Sensation Sensation Light Touch: Impaired Detail Light Touch Impaired Details: Impaired LUE;Impaired LLE (improved slightly from eval) Hot/Cold: Not tested Proprioception: Not tested Stereognosis: Not tested Coordination Gross Motor Movements are Fluid and Coordinated: No Fine Motor Movements are Fluid and Coordinated: No Coordination and Movement Description: significant tone L>R and UB>LB Motor  Motor Motor: Abnormal tone;Hemiplegia Motor - Discharge Observations: significant tone in neck and UB into hips, moderate tone in BLE Mobility  Bed Mobility Bed Mobility: Rolling Right;Sit to Supine;Supine to Sit;Rolling Left Rolling Right: Total Assistance - Patient < 25% Rolling Left: Total Assistance - Patient < 25% Supine to Sit: Total Assistance - Patient < 25% Sit to Supine: Dependent - Patient equal 0% Transfers Sit to Stand: Maximal Assistance - Patient 25-49%;2 Helpers Stand to Sit: Maximal Assistance - Patient 25-49%;2 Helpers  Trunk/Postural Assessment  Cervical Assessment Cervical Assessment: Exceptions to [facility] (forward head) Thoracic Assessment Thoracic Assessment: Exceptions to [facility] (rounded shoulders with kyphotic posturing) Lumbar Assessment Lumbar Assessment: Exceptions to [facility] (posterior pelvic tilt) Postural Control Postural Control: Deficits on evaluation Trunk Control: decreased - holds d/t tone with slow movements out of midline Righting Reactions: significantly decreased Protective Responses: significantly decreased  Balance Balance Balance Assessed: Yes Static Sitting Balance Static Sitting - Balance Support: Feet supported Static Sitting - Level of Assistance: 3: Mod assist Dynamic Sitting Balance Dynamic Sitting - Balance Support: Feet  supported Dynamic Sitting - Level of Assistance: 3: Mod assist Sitting balance - Comments: no postural reactions noted Static Standing Balance Static Standing - Balance Support: During functional activity Static Standing - Level of Assistance: 1: +2 Total assist Extremity/Trunk Assessment RUE Assessment RUE Assessment: Exceptions to [facility] General Strength Comments: 4/5 strength d/t general deconditioning LUE Assessment LUE Assessment: Exceptions to [facility] Passive Range of Motion (PROM) Comments: only assessed to 90 degrees d/t tone at shoulder and to prevent onset of pain General Strength Comments: 3-/5 bicep/tricep, 2-/5 wrist flex/extension   Dustin Hamilton 01/28/2024, 3:51 PM

## 2024-01-28 NOTE — Progress Notes (Addendum)
 Speech Language Pathology Discharge Summary  Patient Details  Name: Dustin Hamilton MRN: [id number] Date of Birth: [birth year]  Date of Discharge from SLP service:January 28, 2024  Today's Date: 01/28/2024 SLP Individual Time: 1000-1015 1145-1200 SLP Individual Time Calculation (min): 15 min 15 minutes 30 minutes missed   Skilled Therapeutic Interventions:   1000-1015: Upon entrance, patient asleep in bed. SLP utilized verbal and tactile cues to awaken.  SLP attempted to complete oral care and provided total A for patient to wipe face with wet cloth. Patient with brief instance of eye opening, however no attempts to communicate or follow directions. Patient with extreme fatigue this date, consistent with prior sessions initiated in the AM. Session d/c due to lethargy. SLP to return as patient and therapist are able. Patient left in bed with alarm set and call bell in reach. Continue POC.   8854-8799: SLP returned to address communicative goals. Nursing present and patient with eyes open upon arrival. Patient verbalized name and feeling not ready to go home due to fear he is not going home. SLP reassured patient of discharge plan. Patient counted 1-10 given modA. Patient left in bed with alarm set and call bell in reach. Continue POC.   Overall, 30 minutes of session missed due to lethargy this AM.    Patient has met 4 of 5 long term goals.  Patient to discharge at overall Mod;Max level.  Reasons goals not met: cont to require maxA for swallowing   Clinical Impression/Discharge Summary: Pt has made fair gains and has met 4 of 5 LTG's this admission due to improved communication and attention. Pt is currently an overall mod-maxA for cognitive and communicative tasks and requires max cues for initiation of swallowing during PO trials of thin and puree textures. Patient remains NPO w/ PEG. Pt/family education complete and pt will discharge home with 24 hour supervision from friends/family/etc.  Pt would benefit from [facility] f/u ST services to maximize communication, cognition and swallowing in order to maximize functional independence.   Care Partner:  Caregiver Able to Provide Assistance: Yes  Type of Caregiver Assistance: Physical;Cognitive  Recommendation:  Home Health SLP;24 hour supervision/assistance  Rationale for SLP Follow Up: Maximize functional communication;Maximize cognitive function and independence;Maximize swallowing safety;Reduce caregiver burden   Equipment: n/a   Reasons for discharge: Discharged from hospital   Patient/Family Agrees with Progress Made and Goals Achieved: Yes    [name] M.A., CCC-SLP 01/28/2024, 10:20 AM

## 2024-01-28 NOTE — Progress Notes (Signed)
 Patient ID: Dustin Hamilton, male   DOB: 11-15-1940, 83 y.o.   MRN: [id number] Supplies for management of PEG tube and administration of tube feedings (syringes and cylinders) and water  flushes (sterile water ) placed in room for discharge.   [name]

## 2024-01-28 NOTE — Plan of Care (Signed)
  Problem: RH Balance Goal: LTG: Patient will maintain dynamic sitting balance (OT) Description: LTG:  Patient will maintain dynamic sitting balance with assistance during activities of daily living (OT) Outcome: Completed/Met   Problem: RH Dressing Goal: LTG Patient will perform upper body dressing (OT) Description: LTG Patient will perform upper body dressing with assist, with/without cues (OT). Outcome: Completed/Met Goal: LTG Patient will perform lower body dressing w/assist (OT) Description: LTG: Patient will perform lower body dressing with assist, with/without cues in positioning using equipment (OT) Outcome: Completed/Met   Problem: RH Attention Goal: LTG Patient will demonstrate this level of attention during functional activites (OT) Description: LTG:  Patient will demonstrate this level of attention during functional activites  (OT) Outcome: Completed/Met

## 2024-01-28 NOTE — Progress Notes (Signed)
  IR BRIEF PROGRESS NOTE:  Dr. Maurice requested IR to evaluate this patient's G-tube, placed on 8/19 by Dr. Karalee. Patient experienced bleeding overnight and today from around skin insertion site. Pictures are available for review under Media, and IR appreciates the uploading of these images. Dr. Maurice noted there appears to be an area of granular tissue at the 1-2 O'clock position.  On examination, there is a 2 mm area from 1 o'clock position to 5' o'clock position around the edge of the G-tube insertion site. The area is non-tender, and compatible with granular tissue. No active bleeding nor oozing is noted, though the split gauze has a small rim of dried blood on it. Silver nitrate was applied to the granular bed, and patient tolerated manipulation without issue. RN was informed/ updated.   Please reach out to IR with any further concerns. IR remains available.   Electronically Signed: Carlin DELENA Griffon, PA-C 01/28/2024, 3:48 PM

## 2024-01-28 NOTE — Progress Notes (Signed)
 Inpatient Rehabilitation Discharge Medication Review by a Pharmacist  A complete drug regimen review was completed for this patient to identify any potential clinically significant medication issues.  High Risk Drug Classes Is patient taking? Indication by Medication  Antipsychotic {Receiving?:26196}   Anticoagulant {Receiving?:26196}   Antibiotic {Receiving?:26196}   Opioid {Receiving?:26196}   Antiplatelet {Receiving?:26196}   Hypoglycemics/insulin {Yes or No?:26198}   Vasoactive Medication {Receiving?:26196}   Chemotherapy {Receiving Chemo?:26197}   Other {Yes or No?:26198}      Type of Medication Issue Identified Description of Issue Recommendation(s)  Drug Interaction(s) (clinically significant)     Duplicate Therapy     Allergy     No Medication Administration End Date     Incorrect Dose     Additional Drug Therapy Needed     Significant med changes from prior encounter (inform family/care partners about these prior to discharge).  Restart or discontinue as appropriate. Communicate medication changes with patient/family at discharge  Other       Clinically significant medication issues were identified that warrant physician communication and completion of prescribed/recommended actions by midnight of the next day:  {Yes or No?:26198}  Name of provider notified for urgent issues identified: ***   Provider Method of Notification: ***    Pharmacist comments: ***   Time spent performing this drug regimen review (minutes): 30   Thank you for allowing pharmacy to be a part of this patient's care.   Bascom JAYSON Louder, PharmD 01/28/2024 12:47 PM     **Pharmacist phone directory can be found on [email or web address] listed under [facility]**

## 2024-01-28 NOTE — Progress Notes (Addendum)
 Urine tea colored. Condom catheter changed. No sign of trauma in genitalia and penile area. Patient endorsing no pain at this time. Where condom was bright red clot medium size in shape. As well as pink lemonade colored urine. Urine collected in bag is tea colored/brown with sediment and blood clots observed. Providers aware and informed.

## 2024-01-28 NOTE — Progress Notes (Signed)
 Temperature obtained. Provider updated about lab results. Diaphoretic at this time and warm. Urine yellow/straw color at condom site after being changed. Urine collected in catheter bag continues to be brown in color.

## 2024-01-28 NOTE — Progress Notes (Signed)
 Physical Therapy Session Note  Patient Details  Name: Dustin Hamilton MRN: [id number] Date of Birth: [birth year]  Today's Date: 01/28/2024 PT Individual Time: 1350-1430 PT Individual Time Calculation (min): 40 min   Short Term Goals: Week 3:  PT Short Term Goal 1 (Week 3): STG = LTG d/t ELOS  Skilled Therapeutic Interventions/Progress Updates:    Pt presents up in w/c (TIS). Focused session on NMR using standing frame to promote WB through BLE/BUE, tone management, functional strengthening, postural control retraining and cognitive remediation with focus on attention and following commands. +2 assist needed for transitions with donning/doffing sling for safety. Pt requires max A for initiation of movement. In standing completed 2 bouts of about 10 min each trial of standing with up to max assist to reposition to reorient to midline, max cues for attention but pt able to manipulate squigg in RUE, perform some functional reaching on command and then during seated break able to place squigg on target in seated position with extra time for processing and then remove it. Returned back to room and transferred back to bed via squat pivot with total+2 assist and to reposition in supine. All needs in reach.   Therapy Documentation Precautions:  Precautions Precautions: Fall Recall of Precautions/Restrictions: Impaired Precaution/Restrictions Comments: L hemipareisis, NPO, new PEG placement, significant global tone L>R and UB>LB Restrictions Weight Bearing Restrictions Per Provider Order: No  Pain: Pt shakes head no when asked about pain.    Therapy/Group: Individual Therapy  Elnor Pizza Sherrell Pizza WENDI Elnor, PT, DPT, CBIS  01/28/2024, 2:58 PM

## 2024-01-28 NOTE — Progress Notes (Signed)
 Patient ID: Dustin Hamilton, male   DOB: 1940/10/31, 83 y.o.   MRN: [id number] Spoke with wife via telephone to discuss transport tomorrow set up via [facility] at 1:00 pm. She has the tube feeding but has not gotten the wheelchair yet. She had questions regarding the medicine and will ask Pam-PA to call her, since [facility] does not have a [facility] with [facility] to fill [facility] here.

## 2024-01-28 NOTE — Plan of Care (Signed)
  Problem: RH Swallowing Goal: LTG Patient will participate in dysphagia therapy to increase swallow function with assistance (SLP) Description: LTG:  Patient will participate in dysphagia therapy to increase swallow function with assistance (SLP) Outcome: Not Met (cont to require maxA)   Problem: RH Comprehension Communication Goal: LTG Patient will comprehend basic/complex auditory (SLP) Description: LTG: Patient will comprehend basic/complex auditory information with cues (SLP). Outcome: Completed/Met   Problem: RH Expression Communication Goal: LTG Patient will express needs/wants via multi-modal(SLP) Description: LTG:  Patient will express needs/wants via multi-modal communication (gestures/written, etc) with cues (SLP) Outcome: Completed/Met   Problem: RH Attention Goal: LTG Patient will demonstrate this level of attention during functional activites (SLP) Description: LTG:  Patient will will demonstrate this level of attention during functional activites (SLP) Outcome: Completed/Met   Problem: RH Expression Communication Goal: LTG Patient will increase speech intelligibility (SLP) Description: LTG: Patient will increase speech intelligibility at word/phrase/conversation level with cues, % of the time (SLP) Outcome: Completed/Met

## 2024-01-28 NOTE — Plan of Care (Signed)
  Problem: Consults Goal: RH STROKE PATIENT EDUCATION Description: See Patient Education module for education specifics  Outcome: Progressing Goal: Nutrition Consult-if indicated Outcome: Progressing Goal: Diabetes Guidelines if Diabetic/Glucose > 140 Description: If diabetic or lab glucose is > 140 mg/dl - Initiate Diabetes/Hyperglycemia Guidelines & Document Interventions  Outcome: Progressing   Problem: RH BOWEL ELIMINATION Goal: RH STG MANAGE BOWEL WITH ASSISTANCE Description: STG Manage Bowel with Assistance. Outcome: Progressing Goal: RH STG MANAGE BOWEL W/MEDICATION W/ASSISTANCE Description: STG Manage Bowel with Medication with Assistance. Outcome: Progressing   Problem: RH BLADDER ELIMINATION Goal: RH STG MANAGE BLADDER WITH ASSISTANCE Description: STG Manage Bladder With Assistance Outcome: Progressing Goal: RH STG MANAGE BLADDER WITH MEDICATION WITH ASSISTANCE Description: STG Manage Bladder With Medication With Assistance. Outcome: Progressing Goal: RH STG MANAGE BLADDER WITH EQUIPMENT WITH ASSISTANCE Description: STG Manage Bladder With Equipment With Assistance Outcome: Progressing   Problem: RH SKIN INTEGRITY Goal: RH STG SKIN FREE OF INFECTION/BREAKDOWN Outcome: Progressing Goal: RH STG MAINTAIN SKIN INTEGRITY WITH ASSISTANCE Description: STG Maintain Skin Integrity With Assistance. Outcome: Progressing Goal: RH STG ABLE TO PERFORM INCISION/WOUND CARE W/ASSISTANCE Description: STG Able To Perform Incision/Wound Care With Assistance. Outcome: Progressing   Problem: RH SAFETY Goal: RH STG ADHERE TO SAFETY PRECAUTIONS W/ASSISTANCE/DEVICE Description: STG Adhere to Safety Precautions With Assistance/Device. Outcome: Progressing Goal: RH STG DECREASED RISK OF FALL WITH ASSISTANCE Description: STG Decreased Risk of Fall With Assistance. Outcome: Progressing   Problem: RH COGNITION-NURSING Goal: RH STG USES MEMORY AIDS/STRATEGIES W/ASSIST TO PROBLEM  SOLVE Description: STG Uses Memory Aids/Strategies With Assistance to Problem Solve. Outcome: Progressing Goal: RH STG ANTICIPATES NEEDS/CALLS FOR ASSIST W/ASSIST/CUES Description: STG Anticipates Needs/Calls for Assist With Assistance/Cues. Outcome: Progressing   Problem: RH PAIN MANAGEMENT Goal: RH STG PAIN MANAGED AT OR BELOW [name] PAIN GOAL Outcome: Progressing   Problem: RH KNOWLEDGE DEFICIT Goal: RH STG INCREASE KNOWLEDGE OF DIABETES Outcome: Progressing Goal: RH STG INCREASE KNOWLEDGE OF HYPERTENSION Outcome: Progressing Goal: RH STG INCREASE KNOWLEDGE OF DYSPHAGIA/FLUID INTAKE Outcome: Progressing Goal: RH STG INCREASE KNOWLEGDE OF HYPERLIPIDEMIA Outcome: Progressing Goal: RH STG INCREASE KNOWLEDGE OF STROKE PROPHYLAXIS Outcome: Progressing   Problem: RH Vision Goal: RH LTG Vision (Specify) Outcome: Progressing   

## 2024-01-28 NOTE — Progress Notes (Signed)
 Nutrition Follow-up  DOCUMENTATION CODES:   Severe malnutrition in context of chronic illness  INTERVENTION:   Continue bolus feeds via PEG, recommend: Jevity 1.5: 5 237 mL cartons per day (5 total feedings), 30mL water  flush before + after each bolus (8am, 11am, 2pm, 5pm, 8pm) Free water  flushes: 4 x per day in between feeds (9am, 12pm, 3pm, 6pm) Provides 1,775 kcal, 75 g protein, 2,067mL water  (TF free water + water  flushes)  Educated pt's wife and caretaker on bolus feeding schedule, free water  flushes, and tube maintenance; printed copies of feeding regimen for all members of at home care team; family and caretaker demonstrated administration of feeds using teach back method  Added bolus feed regimen to AVS and discussed discharge formula regimen with LCSW  NUTRITION DIAGNOSIS:   Severe Malnutrition related to chronic illness as evidenced by severe muscle depletion, severe fat depletion, percent weight loss (12% weight loss within 3 months); ongoing. Remains applicable  GOAL:   Patient will meet greater than or equal to 90% of their needs; met with TF.  MONITOR:   TF tolerance, Diet advancement  REASON FOR ASSESSMENT:   Consult    ASSESSMENT:   83 yo male admitted with functional decline r/t stroke. PMH includes HTN, CVD, BPH, CVA, former alcohol  and mixed drug abuse, glaucoma, dementia, Parkinsonism.  8/19 PEG placement  No family at bedside at time of assessment. Pt sleeping in bed and did not awaken to touch or open eyes. Family and caretakers previously educated on bolus regimen and administration for discharge, no need for further education as family demonstrated technique using teach back method. Pt likely to be discharged tomorrow 8/29, TOC working on making sure family has received tube feeding order before discharge. No toleration issues with bolus feeds reported, will continue current regimen.  Spoke with SLP about pt's recent therapy session. SLP reports  pt was not easily awoken when she attempted session and pt would not respond to questions or open eyes. SLP unable to complete full session. Pt recently had FEES completed and failed. SLP states he may be able to do some purees with full supervision and lots of cues, but at this point long term nutrition support of tube feeds is necessary.   Medications reviewed and include:  Ritalin   MVI w/ minerals  Labs reviewed:  CBG x 24 hr: 94-159 mg/dL  Diet Order:   Diet Order             Diet NPO time specified  Diet effective now                   EDUCATION NEEDS:   No education needs have been identified at this time  Skin:  Skin Assessment: Reviewed RN Assessment (irritant contact dermatitis to coccyx)  Last BM:  8/27 type 4 and 6  Height:   Ht Readings from Last 1 Encounters:  01/04/24 5' 11 (1.803 m)    Weight:   Wt Readings from Last 1 Encounters:  01/18/24 55 kg    Ideal Body Weight:  78.2 kg  BMI:  Body mass index is 16.91 kg/m.  Estimated Nutritional Needs:   Kcal:  1750-1950  Protein:  80-100 gm  Fluid:  1.75-1.95 L   Josette Glance, MS, RDN, LDN Clinical Dietitian I Please reach out via secure chat

## 2024-01-29 ENCOUNTER — Other Ambulatory Visit (HOSPITAL_COMMUNITY): Payer: Self-pay

## 2024-01-29 LAB — CBC
HCT: 41.8 % (ref 39.0–52.0)
Hemoglobin: 13.8 g/dL (ref 13.0–17.0)
MCH: 31.3 pg (ref 26.0–34.0)
MCHC: 33 g/dL (ref 30.0–36.0)
MCV: 94.8 fL (ref 80.0–100.0)
Platelets: 137 K/uL — ABNORMAL LOW (ref 150–400)
RBC: 4.41 MIL/uL (ref 4.22–5.81)
RDW: 14.2 % (ref 11.5–15.5)
WBC: 4.7 K/uL (ref 4.0–10.5)
nRBC: 0 % (ref 0.0–0.2)

## 2024-01-29 LAB — COMPREHENSIVE METABOLIC PANEL WITH GFR
ALT: 65 U/L — ABNORMAL HIGH (ref 0–44)
AST: 41 U/L (ref 15–41)
Albumin: 2.7 g/dL — ABNORMAL LOW (ref 3.5–5.0)
Alkaline Phosphatase: 68 U/L (ref 38–126)
Anion gap: 10 (ref 5–15)
BUN: 25 mg/dL — ABNORMAL HIGH (ref 8–23)
CO2: 25 mmol/L (ref 22–32)
Calcium: 9.6 mg/dL (ref 8.9–10.3)
Chloride: 103 mmol/L (ref 98–111)
Creatinine, Ser: 0.83 mg/dL (ref 0.61–1.24)
GFR, Estimated: >60 mL/min (ref >60)
Glucose, Bld: 99 mg/dL (ref 70–99)
Potassium: 4.5 mmol/L (ref 3.5–5.1)
Sodium: 138 mmol/L (ref 135–145)
Total Bilirubin: 0.5 mg/dL (ref 0.0–1.2)
Total Protein: 6.3 g/dL — ABNORMAL LOW (ref 6.5–8.1)

## 2024-01-29 LAB — URINE CULTURE

## 2024-01-29 LAB — GLUCOSE, CAPILLARY
Glucose-Capillary: 98 mg/dL (ref 70–99)
Glucose-Capillary: 132 mg/dL — ABNORMAL HIGH (ref 70–99)

## 2024-01-29 MED ORDER — CLOPIDOGREL BISULFATE 75 MG PO TABS
75.0000 mg | ORAL_TABLET | Freq: Every day | ORAL | 0 refills | Status: AC
Start: 2024-01-29 — End: ?
  Filled 2024-01-29: qty 30, 30d supply, fill #0

## 2024-01-29 MED ORDER — METHYLPHENIDATE HCL 10 MG PO TABS
10.0000 mg | ORAL_TABLET | Freq: Two times a day (BID) | ORAL | 0 refills | Status: DC
  Filled 2024-01-29: qty 60, 30d supply, fill #0

## 2024-01-29 MED ORDER — CEPHALEXIN 500 MG PO CAPS: 500.0000 mg | ORAL_CAPSULE | Freq: Two times a day (BID) | ORAL | 0 refills | Status: DC

## 2024-01-29 MED ORDER — CEPHALEXIN 500 MG PO CAPS
500.0000 mg | ORAL_CAPSULE | Freq: Two times a day (BID) | ORAL | 0 refills | Status: DC
  Filled 2024-01-29: qty 12, 6d supply, fill #0

## 2024-01-29 NOTE — Progress Notes (Signed)
 Inpatient Rehabilitation Care Coordinator Discharge Note   Patient Details  Name: Dustin Hamilton MRN: [id number] Date of Birth: 27-Dec-1940   Discharge location: HOME WITH WIFE AND HIRED CAREGIVERS AWARE OF NEED FOR 24/7 CARE  Length of Stay: 25 DAYS  Discharge activity level: MAX-TOTAL ASSIST  Home/community participation: HOMEBOUND  Patient response un:Yzjouy Literacy - How often do you need to have someone help you when you read instructions, pamphlets, or other written material from your doctor or pharmacy?: Patient unable to respond  Patient response un:Dnrpjo Isolation - How often do you feel lonely or isolated from those around you?: Patient unable to respond  Services provided included: MD, RD, PT, OT, SLP, RN, CM, Pharmacy, SW  Financial Services:  Financial Services Utilized: Dealer  Choices offered to/list presented to: WIFE  Follow-up services arranged:  Home Health, DME, Patient/Family request agency HH/DME Home Health Agency: [facility]  PT  OT  SP  RN  AIDE    DME : [facility] WHEELCHAIR, HOSPITAL BED, HOYER LIFT, SUCTION MACHINE AND TUBE FEEDINGS HH/DME Requested Agency: WIFE REQUEST  Patient response to transportation need: Is the patient able to respond to transportation needs?: Yes In the past 12 months, has lack of transportation kept you from medical appointments or from getting medications?: No In the past 12 months, has lack of transportation kept you from meetings, work, or from getting things needed for daily living?: No   Patient/Family verbalized understanding of follow-up arrangements:  Yes  Individual responsible for coordination of the follow-up plan: [name]-WIFE [phone]  Confirmed correct DME delivered: Raymonde Asberry MATSU 01/29/2024    Comments (or additional information):PT HAS EVERYTHING AT HOME EXCEPT THE [facility] WHICH IS COMING TODAY OR TOMORROW. WIFE IS AWARE OF HIS CARE NEED AND HAS BEEN HERE AND DONE  HANDS ON WITH TUBE FEEDINGS. HAS HIRED ASSIST TO PROVIDE THE PHYSICAL CARE SINCE SHE IS NO ABLE TO PROVIDE THIS. PT HAS BEEN CHALLENGING HERE AND NOT ABLE TO PARTICIPATE IN THERAPIES DUE TO HIS DEMENTIA AND MULTIPLE MEDICAL DIAGNOSES. AMBULANCE SET UP FOR 1:00 PM TO TRANSPORT PT HOME  Summary of Stay    Date/Time Discharge Planning CSW  01/27/24 0850 WC to be delivered to home today and wife to go to [facility] to pick up tube feediing formula. Plan to DC Friday via non-emergency ambulance. Caregivers in place RGD  01/20/24 0947 Wife is continuing to plan to take him home still getting caregivers in place and DME has not been delivered via [place] so will need to stay until DME delivered. Wife is aware of [facility] contract being [facility] days and wants an extension here. Asked for hands on education with caregivers and has not been done yet. Still awaiting dietican orders to begin tube feedings. RGD  01/20/24 9071 Wife is continuing to plan to take him home still getting caregivers in place and DME has not been delivered via [place] so will need to stay until DME delivered. Wife is aware of [facility] contract being 30 days and wants an extension here. Asked for hands on education with caregivers and has not been done yet RGD  01/13/24 0901 Wife is planning on taking pt home and lining up caregivers, but not sure can provide the level of care he will need. He has been to a SNF in the past and may need to go again if wife can not get the care needed for him. She is here daily but can not assist due to own health issues,  she can direct his care though RGD  01/06/24 0845 Wife wants to take pt home but has health issues of her own and can not provide much assist. Pt was at [facility] last year due to care needs. Pt was very lethargic yesterday and difficult to evaluate. Wife reports better in the afternoon RGD       Aneesha Holloran G

## 2024-01-29 NOTE — Plan of Care (Signed)
 Problem: Education: Goal: Knowledge of General Education information will improve Description: Including pain rating scale, medication(s)/side effects and non-pharmacologic comfort measures Outcome: Progressing   Problem: Health Behavior/Discharge Planning: Goal: Ability to manage health-related needs will improve Outcome: Progressing   Problem: Clinical Measurements: Goal: Ability to maintain clinical measurements within normal limits will improve Outcome: Progressing Goal: Will remain free from infection Outcome: Progressing Goal: Diagnostic test results will improve Outcome: Progressing Goal: Respiratory complications will improve Outcome: Progressing Goal: Cardiovascular complication will be avoided Outcome: Progressing   Problem: Activity: Goal: Risk for activity intolerance will decrease Outcome: Progressing   Problem: Nutrition: Goal: Adequate nutrition will be maintained Outcome: Progressing   Problem: Coping: Goal: Level of anxiety will decrease Outcome: Progressing   Problem: Elimination: Goal: Will not experience complications related to bowel motility Outcome: Progressing Goal: Will not experience complications related to urinary retention Outcome: Progressing   Problem: Pain Managment: Goal: General experience of comfort will improve and/or be controlled Outcome: Progressing   Problem: Safety: Goal: Ability to remain free from injury will improve Outcome: Progressing   Problem: Skin Integrity: Goal: Risk for impaired skin integrity will decrease Outcome: Progressing   Problem: Consults Goal: RH STROKE PATIENT EDUCATION Description: See Patient Education module for education specifics  Outcome: Progressing Goal: Nutrition Consult-if indicated Outcome: Progressing Goal: Diabetes Guidelines if Diabetic/Glucose > 140 Description: If diabetic or lab glucose is > 140 mg/dl - Initiate Diabetes/Hyperglycemia Guidelines & Document Interventions   Outcome: Progressing   Problem: RH BOWEL ELIMINATION Goal: RH STG MANAGE BOWEL WITH ASSISTANCE Description: STG Manage Bowel with Assistance. Outcome: Progressing Goal: RH STG MANAGE BOWEL W/MEDICATION W/ASSISTANCE Description: STG Manage Bowel with Medication with Assistance. Outcome: Progressing   Problem: RH BLADDER ELIMINATION Goal: RH STG MANAGE BLADDER WITH ASSISTANCE Description: STG Manage Bladder With Assistance Outcome: Progressing Goal: RH STG MANAGE BLADDER WITH MEDICATION WITH ASSISTANCE Description: STG Manage Bladder With Medication With Assistance. Outcome: Progressing Goal: RH STG MANAGE BLADDER WITH EQUIPMENT WITH ASSISTANCE Description: STG Manage Bladder With Equipment With Assistance Outcome: Progressing   Problem: RH SKIN INTEGRITY Goal: RH STG SKIN FREE OF INFECTION/BREAKDOWN Outcome: Progressing Goal: RH STG MAINTAIN SKIN INTEGRITY WITH ASSISTANCE Description: STG Maintain Skin Integrity With Assistance. Outcome: Progressing Goal: RH STG ABLE TO PERFORM INCISION/WOUND CARE W/ASSISTANCE Description: STG Able To Perform Incision/Wound Care With Assistance. Outcome: Progressing   Problem: RH SAFETY Goal: RH STG ADHERE TO SAFETY PRECAUTIONS W/ASSISTANCE/DEVICE Description: STG Adhere to Safety Precautions With Assistance/Device. Outcome: Progressing Goal: RH STG DECREASED RISK OF FALL WITH ASSISTANCE Description: STG Decreased Risk of Fall With Assistance. Outcome: Progressing   Problem: RH COGNITION-NURSING Goal: RH STG USES MEMORY AIDS/STRATEGIES W/ASSIST TO PROBLEM SOLVE Description: STG Uses Memory Aids/Strategies With Assistance to Problem Solve. Outcome: Progressing Goal: RH STG ANTICIPATES NEEDS/CALLS FOR ASSIST W/ASSIST/CUES Description: STG Anticipates Needs/Calls for Assist With Assistance/Cues. Outcome: Progressing   Problem: RH PAIN MANAGEMENT Goal: RH STG PAIN MANAGED AT OR BELOW [name] PAIN GOAL Outcome: Progressing   Problem: RH  KNOWLEDGE DEFICIT Goal: RH STG INCREASE KNOWLEDGE OF DIABETES Outcome: Progressing Goal: RH STG INCREASE KNOWLEDGE OF HYPERTENSION Outcome: Progressing Goal: RH STG INCREASE KNOWLEDGE OF DYSPHAGIA/FLUID INTAKE Outcome: Progressing Goal: RH STG INCREASE KNOWLEGDE OF HYPERLIPIDEMIA Outcome: Progressing Goal: RH STG INCREASE KNOWLEDGE OF STROKE PROPHYLAXIS Outcome: Progressing   Problem: RH Vision Goal: RH LTG Vision (Specify) Outcome: Progressing   Problem: RH Pre-functional/Other (Specify) Goal: RH LTG Pre-functional (Specify) Outcome: Progressing Goal: RH LTG Interdisciplinary (Specify) 1 Description: RH LTG Interdisciplinary (Specify)1 Outcome: Progressing Goal:  RH LTG Interdisciplinary (Specify) 2 Description: RH LTG Interdisciplinary (Specify) 2 Outcome: Progressing

## 2024-01-29 NOTE — Progress Notes (Signed)
 PROGRESS NOTE   Subjective/Complaints:  G tube site bleeding improved. Pt to DC home today.  Urine a little dark in bag.    ROS- limited by cognition  Objective:   No results found.   Recent Labs    01/28/24 0539 01/29/24 0456  WBC 5.6 4.7  HGB 13.6 13.8  HCT 41.3 41.8  PLT 149* 137*      Recent Labs    01/28/24 0539 01/29/24 0456  NA 138 138  K 4.6 4.5  CL 104 103  CO2 24 25  GLUCOSE 97 99  BUN 35* 25*  CREATININE 0.84 0.83  CALCIUM  9.6 9.6       Intake/Output Summary (Last 24 hours) at 01/29/2024 1804 Last data filed at 01/29/2024 1011 Gross per 24 hour  Intake 600 ml  Output 1750 ml  Net -1150 ml        Physical Exam: Vital Signs Blood pressure 125/78, pulse 71, temperature (!) 97.4 F (36.3 C), temperature source Axillary, resp. rate 16, height 5' 11 (1.803 m), weight 55 kg, SpO2 96%.  General: No acute distress, laying in bed, drowsy this morning mood and affect flat Heart: Regular rate and rhythm no rubs murmurs or extra sounds Lungs: Clear to auscultation bilaterally though poor inspiratory effort, nonlabored breathing Abdomen: Positive bowel sounds, soft nontender to palpation, nondistended PEG tube in place-  no tenderness noted around PEG tube site, no current bleeding around PEG site Extremities: No clubbing, cyanosis, or edema, TEDs donned Skin: No breakdown noted over visible surfaces Neuro: Drowsy but opens eyes to voice, delayed response, minimal verbal output  Neurologic: masked facies, eyes closed , no spontaneous movement .Sensory exam limited by attention and concentration   Musculoskeletal: no pain with hip or knee ROM BLE GU: pink urine in bag Prior neuro assessment is c/w today's exam 01/29/2024.  Prior exam  motor strength is 4/5 in right deltoid, bicep, tricep, grip, 2-/5 LUE and LLE- MMT varies based on LOA - lost focus during LE MMT with limited attempt despite  cuing   Assessment/Plan: 1. Functional deficits which require 3+ hours per day of interdisciplinary therapy in a comprehensive inpatient rehab setting. Physiatrist is providing close team supervision and 24 hour management of active medical problems listed below. Physiatrist and rehab team continue to assess barriers to discharge/monitor patient progress toward functional and medical goals  Care Tool:  Bathing    Body parts bathed by patient: Face, Left arm   Body parts bathed by helper: Left arm, Abdomen, Chest, Right arm, Right upper leg, Left upper leg     Bathing assist Assist Level: Total Assistance - Patient < 25%     Upper Body Dressing/Undressing Upper body dressing   What is the patient wearing?: Pull over shirt    Upper body assist Assist Level: Maximal Assistance - Patient 25 - 49%    Lower Body Dressing/Undressing Lower body dressing      What is the patient wearing?: Underwear/pull up, Pants     Lower body assist Assist for lower body dressing: 2 Helpers     Toileting Toileting    Toileting assist Assist for toileting: Dependent - Patient 0%  Transfers Chair/bed transfer  Transfers assist  Chair/bed transfer activity did not occur: Safety/medical concerns  Chair/bed transfer assist level: 2 Helpers     Locomotion Ambulation   Ambulation assist   Ambulation activity did not occur: Safety/medical concerns  Assist level: 2 helpers Assistive device: Other (comment) (3 musketeer support) Max distance: 6 ft   Walk 10 feet activity   Assist  Walk 10 feet activity did not occur: Safety/medical concerns        Walk 50 feet activity   Assist Walk 50 feet with 2 turns activity did not occur: Safety/medical concerns         Walk 150 feet activity   Assist Walk 150 feet activity did not occur: Safety/medical concerns         Walk 10 feet on uneven surface  activity   Assist Walk 10 feet on uneven surfaces activity did not  occur: Safety/medical concerns         Wheelchair     Assist Is the patient using a wheelchair?: Yes Type of Wheelchair: Manual Wheelchair activity did not occur: Safety/medical concerns  Wheelchair assist level: Dependent - Patient 0%      Wheelchair 50 feet with 2 turns activity    Assist    Wheelchair 50 feet with 2 turns activity did not occur: Safety/medical concerns   Assist Level: Dependent - Patient 0%   Wheelchair 150 feet activity     Assist  Wheelchair 150 feet activity did not occur: Safety/medical concerns   Assist Level: Dependent - Patient 0%   Blood pressure 125/78, pulse 71, temperature (!) 97.4 F (36.3 C), temperature source Axillary, resp. rate 16, height 5' 11 (1.803 m), weight 55 kg, SpO2 96%.  Medical Problem List and Plan: 1. 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  Will discuss further with team tomorrow team meeting -Team conference today please see physician documentation under team conference tab, met with team  to discuss problems,progress, and goals. Formulized individual treatment plan based on medical history, underlying problem and comorbidities.  - Patient's wife is still waiting on Tis wheelchair and tube feeding supplies.  She is also still working on getting the daytime caregiver. -Social work reports TIS chair to be here Wednesday, still working on tube feedings -Wife still working on getting tube feedings - Expected discharge 8/29- today   2.  Antithrombotics: -DVT/anticoagulation:  Pharmaceutical: Lovenox  40mg  daily             -antiplatelet  therapy: ASA/Plavix  X 3 weeks followed by Plavix  alone Plavix  and aspirin  held for PEG-- Restart   3. Pain Management: Tylenol  prn.  4. Mood/Behavior/Sleep: LCSW to follow for evaluation and support.             --hx of sundowning-->continue melatonin 3 mg/Monitor sleep hygeine             -antipsychotic agents: N/A -Reduced level of alertness trial amantadine - no significant changes will trial methylphenidate  10mg  BID starting noon 8/11 -8/15 patient reported to be a little more alert working with OT this  morning.  Continue current regimen -8/20 Continue methylphenidate  10 mg twice daily -01/24/24 much more alert today! Actually has a conversation with me today -8/26 Drowsy this AM, later he was more alert  participating with therapy -8/27 drowsy this morning but appears to have participated in therapy later in the day at baseline level 5. Neuropsych/cognition: This patient is not capable of making decisions  6. Skin/Wound Care:  Routine pressure relief measures.  7. Fluids/Electrolytes/Nutrition: Continue NPO due to holding of orals.              --on tube feeds.  Appreciate dietitian assistance. 8. HTN: Monitor BP TID--goal 150 to avoid symptoms (multiple ED visits this year)             --hx of chronic dizziness. Needs to stay hydrated.  -8/22-29 well-controlled overall continue current regimen Vitals:   01/25/24 1417 01/25/24 2003 01/26/24 0525 01/26/24 1306  BP: 117/89 106/79 110/79 128/79   01/26/24 2016 01/27/24 0437 01/27/24 1505 01/27/24 2056  BP: 120/88 103/72 109/79 101/71   01/28/24 0321 01/28/24 1350 01/28/24 1940 01/29/24 0339  BP: 130/76 104/77 121/87 125/78    9. H/o SVT/CHF s/p PPM/syncope: Monitor for orthostatic symptoms   10. BPH/urinary retention: Urecholine  and Proscar  cannot be crushed per pharmacy so will hold (cannot use Avodart  capsule due to TF).  -01/09/24 PVR documented once, 66; monitor; of note, getting urocholine 10mg  TID... -01/10/24 low PVRs but not many  documented -01/17/24 poor documentation of PVRs, but using condom cath and output looks ok. Monitor.  -8/19 has not required recent IC, bladder scan volumes not particularly elevated.   -8/20 bladder scan 0-230 continue to monitor  - 8/22-25 incontinence present but not having retention requiring IC -8/26 medication options limited by Tube feeding / NPO  -8/28 pink urine noted, UA completed indicating potential UTI, Keflex  started 8/29 dark urine noted, Urine culture multiple species but will finish course keflex . Discussed with urology has hx of hemorrhagic cysts no acute intervention recommended, f/u outpatient urology is scheduled, HGB 13.8 stable 11. 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 Glaucoma: Resume home gtts Alphagan , Trusopt , Xalatan . 13. Dysphagia: NPO with tube feeds. May need PEG for nutritional support. BUN up will increase free H20  - Patient has IR consult for PEG, CT scan completed  - PEG for tomorrow scheduled.  Tube feeds and free water  held at midnight.  - 8/19 PEG placed today, IR indicates diet may be advanced tomorrow.  Will start gentle IVF NS for 24h   --8/20 Tube feeding starting today, Will not continue IVF after this afternoon   -8/21 continue tube feedings with PEG  8/22 patient's wife is still working on getting home tube feeding formula delivered  8/26FEES yesterday, continue NPO  8/28 small amount of bleeding around G-tube site, seen by radiology noted to have small amount of granular tissue without bleeding noted this afternoon, small amount of dried blood.  Radiology applied small amount of silver nitrate  8/29 G tube looks ok 14. Acute renal failure: Improved from 24/1.36 to 27/0.99>> 30/0.89  - 8/14 improved at creatinine 0.79/BUN 30  -8/18 BUN/creatinine stable at 32/0.83   -8/20 Free water  restarted by PEG today   - 8/21 BUN/creatinine stable  -8/25 Increase free  water    -8/28 creatinine improved today to 0.84, continue current regimen 15. Chronic Thrombocytopenia: stable/improving    -8/25 stable 209  Platelets a little low  today at 149, continue to monitor outpatient with PCP  PLT a little lower 137 but appears to be chronic, f/u outpatient    Latest Ref Rng & Units 01/29/2024    4:56 AM 01/28/2024    5:39 AM 01/25/2024    5:16 AM  CBC  WBC 4.0 - 10.5 K/uL 4.7  5.6  7.0   Hemoglobin 13.0 - 17.0 g/dL 86.1  86.3  85.0   Hematocrit 39.0 - 52.0 % 41.8  41.3  43.8   Platelets 150 - 400 K/uL 137  149  209      16.  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  UA + WBC but only rare bact, treat empirically keflex  500mg  BID x 3 d to see if this improves MS -No change in cognition noted  Urine blood tinged yesterday , monitor Hgb ok  Hgb is normal , mildly low plt also on asa , plavix  and enoxaparin , will monitor  May need to hold enoxaparin   8/15 urine recorded is yellow/straw, continue to monitor  8/28 Keflex  started for UTI as above, monitor cultures  Finish keflex  course  18. Hx of gout: allopurinol  100mg  daily   LOS: 25 days A FACE TO FACE EVALUATION WAS PERFORMED  Murray Collier 01/29/2024, 6:04 PM

## 2024-01-29 NOTE — Progress Notes (Signed)
 Significant other of patient taking belongings home. [facility] here to transport patient back to home/personal residence. Discussed with significant other safety measures and aspiration risk. Discussed and teach significant other about how to give medication and feeding supplements with G-Tube. Assisting family member giving feeding supplement to patient. Demonstrated how to clean the tubing and stop cock. No further questions or concerns by significant other at this time.

## 2024-02-01 NOTE — Plan of Care (Signed)
  Problem: RH Balance Goal: LTG Patient will maintain dynamic sitting balance (PT) Description: LTG:  Patient will maintain dynamic sitting balance with assistance during mobility activities (PT) Outcome: Completed/Met Flowsheets (Taken 01/24/2024 2308) LTG: Pt will maintain dynamic sitting balance during mobility activities with:: Moderate Assistance - Patient 50 - 74% Goal: LTG Patient will maintain dynamic standing balance (PT) Description: LTG:  Patient will maintain dynamic standing balance with assistance during mobility activities (PT) Outcome: Completed/Met Flowsheets (Taken 01/24/2024 2308) LTG: Pt will maintain dynamic standing balance during mobility activities with:: Total Assistance - Patient < 25%   Problem: Sit to Stand Goal: LTG:  Patient will perform sit to stand with assistance level (PT) Description: LTG:  Patient will perform sit to stand with assistance level (PT) Outcome: Completed/Met Flowsheets (Taken 01/24/2024 2308) LTG: PT will perform sit to stand in preparation for functional mobility with assistance level: Maximal Assistance - Patient 25 - 49%   Problem: RH Bed Mobility Goal: LTG Patient will perform bed mobility with assist (PT) Description: LTG: Patient will perform bed mobility with assistance, with/without cues (PT). Outcome: Completed/Met Flowsheets (Taken 01/24/2024 2308) LTG: Pt will perform bed mobility with assistance level of: Maximal Assistance - Patient 25 - 49%   Problem: RH Bed to Chair Transfers Goal: LTG Patient will perform bed/chair transfers w/assist (PT) Description: LTG: Patient will perform bed to chair transfers with assistance (PT). Outcome: Completed/Met Flowsheets (Taken 01/24/2024 2308) LTG: Pt will perform Bed to Chair Transfers with assistance level: Total Assistance - Patient < 25%   Problem: RH Ambulation Goal: LTG Patient will ambulate in controlled environment (PT) Description: LTG: Patient will ambulate in a controlled  environment, # of feet with assistance (PT). Outcome: Completed/Met Flowsheets Taken 01/24/2024 2308 LTG: Pt will ambulate in controlled environ  assist needed:: Total Assistance - Patient < 25% Taken 01/06/2024 0558 LTG: Ambulation distance in controlled environment: 30 ft using LRAD

## 2024-02-02 ENCOUNTER — Encounter: Payer: Self-pay | Admitting: Physical Medicine & Rehabilitation

## 2024-03-05 ENCOUNTER — Emergency Department (HOSPITAL_COMMUNITY)
Admission: EM | Admit: 2024-03-05 | Discharge: 2024-03-05 | Disposition: A | Discharge status: Home / Self Care | Attending: Emergency Medicine | Admitting: Emergency Medicine

## 2024-03-05 ENCOUNTER — Encounter (HOSPITAL_COMMUNITY): Payer: Self-pay | Admitting: Emergency Medicine

## 2024-03-05 DIAGNOSIS — G20C Parkinsonism, unspecified: Secondary | ICD-10-CM | POA: Insufficient documentation

## 2024-03-05 DIAGNOSIS — Z7902 Long term (current) use of antithrombotics/antiplatelets: Secondary | ICD-10-CM | POA: Diagnosis not present

## 2024-03-05 DIAGNOSIS — N3001 Acute cystitis with hematuria: Secondary | ICD-10-CM | POA: Diagnosis not present

## 2024-03-05 DIAGNOSIS — Z8673 Personal history of transient ischemic attack (TIA), and cerebral infarction without residual deficits: Secondary | ICD-10-CM | POA: Diagnosis not present

## 2024-03-05 DIAGNOSIS — R319 Hematuria, unspecified: Secondary | ICD-10-CM | POA: Diagnosis present

## 2024-03-05 DIAGNOSIS — F0390 Unspecified dementia without behavioral disturbance: Secondary | ICD-10-CM | POA: Diagnosis not present

## 2024-03-05 LAB — CBC WITH DIFFERENTIAL/PLATELET
Abs Immature Granulocytes: 0.02 K/uL (ref 0.00–0.07)
Basophils Absolute: 0 K/uL (ref 0.0–0.1)
Basophils Relative: 0 %
Eosinophils Absolute: 0.1 K/uL (ref 0.0–0.5)
Eosinophils Relative: 2 %
HCT: 53.7 % — ABNORMAL HIGH (ref 39.0–52.0)
Hemoglobin: 17.4 g/dL — ABNORMAL HIGH (ref 13.0–17.0)
Immature Granulocytes: 0 %
Lymphocytes Relative: 23 %
Lymphs Abs: 1.1 K/uL (ref 0.7–4.0)
MCH: 30.9 pg (ref 26.0–34.0)
MCHC: 32.4 g/dL (ref 30.0–36.0)
MCV: 95.4 fL (ref 80.0–100.0)
Monocytes Absolute: 0.4 K/uL (ref 0.1–1.0)
Monocytes Relative: 9 %
Neutro Abs: 3.3 K/uL (ref 1.7–7.7)
Neutrophils Relative %: 66 %
Platelets: 165 K/uL (ref 150–400)
RBC: 5.63 MIL/uL (ref 4.22–5.81)
RDW: 14.6 % (ref 11.5–15.5)
WBC: 5 K/uL (ref 4.0–10.5)
nRBC: 0 % (ref 0.0–0.2)

## 2024-03-05 LAB — BASIC METABOLIC PANEL WITH GFR
Anion gap: 12 (ref 5–15)
BUN: 20 mg/dL (ref 8–23)
CO2: 27 mmol/L (ref 22–32)
Calcium: 10 mg/dL (ref 8.9–10.3)
Chloride: 101 mmol/L (ref 98–111)
Creatinine, Ser: 0.77 mg/dL (ref 0.61–1.24)
GFR, Estimated: >60 mL/min (ref >60)
Glucose, Bld: 79 mg/dL (ref 70–99)
Potassium: 4.2 mmol/L (ref 3.5–5.1)
Sodium: 140 mmol/L (ref 135–145)

## 2024-03-05 LAB — URINALYSIS, ROUTINE W REFLEX MICROSCOPIC
Bilirubin Urine: NEGATIVE
Glucose, UA: NEGATIVE mg/dL
Ketones, ur: NEGATIVE mg/dL
Nitrite: NEGATIVE
Protein, ur: 100 mg/dL — AB
Specific Gravity, Urine: 1.004 — ABNORMAL LOW (ref 1.005–1.030)
WBC, UA: >50 WBC/hpf (ref 0–5)
pH: 8 (ref 5.0–8.0)

## 2024-03-05 MED ORDER — CIPROFLOXACIN HCL 500 MG PO TABS: 500.0000 mg | ORAL_TABLET | Freq: Two times a day (BID) | ORAL | 0 refills | Status: DC

## 2024-03-05 MED ORDER — CIPROFLOXACIN HCL 500 MG PO TABS: 500.0000 mg | ORAL_TABLET | Freq: Two times a day (BID) | ORAL | 0 refills | Status: AC

## 2024-03-05 MED ORDER — CIPROFLOXACIN HCL 500 MG PO TABS
500.0000 mg | ORAL_TABLET | ORAL | Status: AC
  Administered 2024-03-05: 500 mg
  Filled 2024-03-05: qty 1

## 2024-03-05 MED ORDER — CIPROFLOXACIN HCL 500 MG PO TABS: 500.0000 mg | ORAL_TABLET | ORAL | Status: DC

## 2024-03-05 NOTE — ED Provider Notes (Signed)
 [place] EMERGENCY DEPARTMENT AT [facility] Provider Note   CSN: [id number] Arrival date & time: 03/05/24  1620     Patient presents with: Hematuria   Dustin Hamilton is a 83 y.o. male.   83 yo M hx of stroke with residual weakness, ICH, SVT, CHB sp PPM, parkinsons, and dementia who presents with hematuria. Hx limited by baseline mental status and was obtained by his wife.  Reports that today his urine was very dark.  No gross hematuria.  No fevers or chills or abdominal pain or flank pain. No confusion. Is on plavix . Lives at home with home health.        Prior to Admission medications   Medication Sig Start Date End Date Taking? Authorizing Provider  acetaminophen  (TYLENOL ) 325 MG tablet Place 2 tablets (650 mg total) into feeding tube 3 (three) times daily as needed for moderate pain (pain score 4-6). 01/20/24   Angiulli, Toribio PARAS, PA-C  allopurinol  (ZYLOPRIM ) 100 MG tablet Place 1 tablet (100 mg total) into feeding tube daily. 01/04/24 01/03/25  Gherghe, Costin M, MD  brimonidine  (ALPHAGAN ) 0.2 % ophthalmic solution Place 1 drop into both eyes 2 (two) times daily. 10/19/22   Sridharan, Sriramkumar, MD  cephALEXin  (KEFLEX ) 500 MG capsule Place 1 capsule (500 mg total) into feeding tube every 12 (twelve) hours. 01/29/24   Love, Sharlet RAMAN, PA-C  ciprofloxacin  (CIPRO ) 500 MG tablet Take 1 tablet (500 mg total) by mouth every 12 (twelve) hours for 10 days. 03/05/24 03/15/24  Yolande Lamar BROCKS, MD  clopidogrel  (PLAVIX ) 75 MG tablet Place 1 tablet (75 mg total) into feeding tube daily. 01/29/24   Love, Sharlet RAMAN, PA-C  dorzolamide  (TRUSOPT ) 2 % ophthalmic solution Place 1 drop into both eyes 2 (two) times daily.    [provider]  galantamine  (RAZADYNE ) 4 MG tablet Place 1 tablet (4 mg total) into feeding tube daily with breakfast. 01/20/24   Angiulli, Toribio PARAS, PA-C  latanoprost  (XALATAN ) 0.005 % ophthalmic solution Place 1 drop into both eyes at bedtime.    [provider]  melatonin 3 MG TABS tablet Place 1 tablet (3 mg total) into feeding tube at bedtime. 01/28/24   Love, Sharlet RAMAN, PA-C  memantine  (NAMENDA ) 10 MG tablet Place 1 tablet (10 mg total) into feeding tube 2 (two) times daily. 01/20/24   Angiulli, Toribio PARAS, PA-C  methylphenidate  (RITALIN ) 10 MG tablet Place 1 tablet (10 mg total) into feeding tube 2 (two) times daily with breakfast and lunch. 01/29/24   Love, Sharlet RAMAN, PA-C  Multiple Vitamin (MULTIVITAMIN WITH MINERALS) TABS tablet Place 1 tablet into feeding tube daily. 01/28/24   Love, Sharlet RAMAN, PA-C  Nutritional Supplements (FEEDING SUPPLEMENT, JEVITY 1.5 CAL/FIBER,) LIQD Place 237 mLs into feeding tube 5 (five) times daily. 01/26/24   Love, Sharlet RAMAN, PA-C  Nystatin (GERHARDT'S BUTT CREAM) CREA Apply 1 Application topically 2 (two) times daily. 01/20/24   Angiulli, Toribio PARAS, PA-C  Water  For Irrigation, Sterile (FREE WATER ) SOLN Place 300 mLs into feeding tube 4 (four) times daily - after meals and at bedtime. 01/26/24   Love, Sharlet RAMAN, PA-C    Allergies: Penicillins, Levetiracetam , Aricept  [donepezil ], Exelon [rivastigmine], and Viagra [sildenafil]    Review of Systems  Updated Vital Signs BP (!) 154/104   Pulse 83   Temp 97.8 F (36.6 C) (Oral)   Resp 18   SpO2 99%   Physical Exam Abdominal:     General: There is no distension.  Palpations: There is no mass.     Tenderness: There is no abdominal tenderness. There is no right CVA tenderness, left CVA tenderness or guarding.  Genitourinary:    Comments: Uncircumcised penis.  No blood from the urethral meatus noted. Neurological:     Mental Status: He is alert.     (all labs ordered are listed, but only abnormal results are displayed) Labs Reviewed  URINALYSIS, ROUTINE W REFLEX MICROSCOPIC - Abnormal; Notable for the following components:      Result Value   Color, Urine AMBER (*)    APPearance CLOUDY (*)    Specific Gravity, Urine 1.004 (*)    Hgb urine dipstick LARGE  (*)    Protein, ur 100 (*)    Leukocytes,Ua LARGE (*)    Bacteria, UA MANY (*)    All other components within normal limits  CBC WITH DIFFERENTIAL/PLATELET - Abnormal; Notable for the following components:   Hemoglobin 17.4 (*)    HCT 53.7 (*)    All other components within normal limits  URINE CULTURE  BASIC METABOLIC PANEL WITH GFR    EKG: None  Radiology: No results found.   Procedures   Medications Ordered in the ED  ciprofloxacin  (CIPRO ) tablet 500 mg (500 mg Per Tube Given 03/05/24 1816)    Clinical Course as of 03/05/24 1957  Sat Mar 05, 2024  1708 Bladder scan shows 121 mL [RP]    Clinical Course User Index [RP] Yolande Lamar BROCKS, MD                                 Medical Decision Making Amount and/or Complexity of Data Reviewed Labs: ordered.  Risk Prescription drug management.   83 year old male history of stroke with residual weakness, ICH, dementia, Parkinson's disease, and CHB status post PPM who presents emergency department hematuria  Initial Ddx:  UTI, pyelonephritis, kidney stone, tumor  MDM/Course:  Patient presents emergency department with hematuria.  History is somewhat difficult to obtain given his dementia, Parkinson's, and stroke.  Both he and his wife deny any fevers, flank pain, vomiting, or severe abdominal pain to me.  Does have some amber-colored urine but no gross hematuria.  Urinalysis was sent that appears to be consistent with UTI.  He is overall well-appearing on exam.  Not having any CVA tenderness to palpation.  Low suspicion for kidney stone at this point in time and I suspect that his symptoms are likely due to a UTI.  Will have him follow-up with urology to see if he needs cystoscopy as well.  Based on prior urine cultures we will treat him with Cipro  today.    This patient presents to the ED for concern of complaints listed in HPI, this involves an extensive number of treatment options, and is a complaint that carries with it  a high risk of complications and morbidity. Disposition including potential need for admission considered.   Dispo: DC Home. Return precautions discussed including, but not limited to, those listed in the AVS. Allowed pt time to ask questions which were answered fully prior to dc.  Additional history obtained from spouse Records reviewed Outpatient Clinic Notes The following labs were independently interpreted: Urinalysis and show urinary tract infection I have reviewed the patients home medications and made adjustments as needed Social Determinants of health:  Geriatric  Portions of this note were generated with Scientist, clinical (histocompatibility and immunogenetics). Dictation errors may occur despite best attempts at  proofreading.     Final diagnoses:  Acute cystitis with hematuria    ED Discharge Orders          Ordered    ciprofloxacin  (CIPRO ) 500 MG tablet  Every 12 hours,   Status:  Discontinued        03/05/24 1818    ciprofloxacin  (CIPRO ) 500 MG tablet  Every 12 hours        03/05/24 1918               Yolande Lamar BROCKS, MD 03/05/24 1958

## 2024-03-05 NOTE — Discharge Instructions (Signed)
 You were seen for your urinary tract infection and the blood in your urine in the emergency department.   At home, please take the ciprofloxacin  we have prescribed you.    Check your MyChart online for the results of any tests that had not resulted by the time you left the emergency department.   Follow-up with your primary doctor in 2-3 days regarding your visit.  Follow-up with urology to see if you need a cystoscopy once the urinary tract infection is cleared.  Return immediately to the emergency department if you experience any of the following: Fevers, flank pain, or any other concerning symptoms.    Thank you for visiting our Emergency Department. It was a pleasure taking care of you today.

## 2024-03-05 NOTE — ED Notes (Signed)
 [facility] called unable to give pick up time

## 2024-03-05 NOTE — ED Triage Notes (Signed)
 Pt here from home with c/o hematuria that family noticed today , pt has history of stoke and is on plavix 

## 2024-03-07 LAB — URINE CULTURE: Culture: >=100000 — AB

## 2024-03-10 ENCOUNTER — Encounter: Admitting: Physical Medicine & Rehabilitation

## 2024-03-21 ENCOUNTER — Encounter: Payer: Self-pay | Admitting: Diagnostic Neuroimaging

## 2024-03-21 ENCOUNTER — Ambulatory Visit: Admitting: Diagnostic Neuroimaging

## 2024-03-21 VITALS — BP 118/84 | HR 118 | Ht 68.0 in

## 2024-03-21 DIAGNOSIS — I680 Cerebral amyloid angiopathy: Secondary | ICD-10-CM

## 2024-03-21 DIAGNOSIS — E854 Organ-limited amyloidosis: Secondary | ICD-10-CM | POA: Diagnosis not present

## 2024-03-21 DIAGNOSIS — I639 Cerebral infarction, unspecified: Secondary | ICD-10-CM | POA: Diagnosis not present

## 2024-03-21 DIAGNOSIS — F02B0 Dementia in other diseases classified elsewhere, moderate, without behavioral disturbance, psychotic disturbance, mood disturbance, and anxiety: Secondary | ICD-10-CM | POA: Diagnosis not present

## 2024-03-21 DIAGNOSIS — G301 Alzheimer's disease with late onset: Secondary | ICD-10-CM

## 2024-03-21 NOTE — Patient Instructions (Signed)
 RECURRENT STROKE (embolic) - continue clopidogrel  75 daily - continue supportive care at home    DEMENTIA (VASCULAR + neurodegenerative / alzheimer's) - continue memantine  (previously tried donepezil ) - safety / supervision issues reviewed

## 2024-03-21 NOTE — Progress Notes (Signed)
 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 Completed CIR; now back home with home care aids.   UPDATE (05/06/23, VRP): Since last visit, WAS IN HOSPITAL in Oct 2024 for new onset loss of consciousness. Taken to ER, and noted to be aphasic and confused and staring. Diagnosed with new left PCA infarct. Discharged to rehab 16 days, then back home.   UPDATE (08/14/20, VRP): Since last visit, had VEEG in Dec 2021, and multiple spells were captured, without EEG correlation. 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 Having 2-3 spells per day. No major changes in BP.    UPDATE (05/14/20, VRP): Since last visit, admitted for syncope (Dec 2021). EEG showed some slowing, but no epileptiform discharges. Has continued to have mild dizzy spells without LOC. During our evaluation patient had a witnessed event; he said I feel it coming on, then he leaned over, said oh no; oh no, grabbed his walker handled, breathed heavily for 1-2 minutes, could squeeze my hand on command, but not could not say his name initially, but then could speak later on. Patient was amnestic to the spell. This is similar to events witnessed by wife at home (~3-4 per month; zone out; not able to speak).   UPDATE (03/14/19, VRP): Since last visit, doing about the same. Symptoms are stable. Severity is moderate. No alleviating or aggravating factors. Tolerating meds. Memory stable. Balance is poor.   UPDATE (09/06/18, VRP): Since last visit, doing well until 1 weeks ago; patient woke up  with new onset of slurred speech, left arm weakness, left arm numbness, trouble walking.  Wife called EMS for evaluation.  By the time they arrived symptoms were starting to improve.  He was able to stand and walk.  His blood pressure was 150s over 80s.  He did not have any fever.  Due to current virus pandemic, mild symptoms, rapidly improving symptoms, apparently EMS told patient that it would be safer if he stays at home, although I cannot verify this information.  Since that time symptoms have significantly improved.  His slurred speech has improved.  He still has weakness and numbness of his left arm.  His balance is unsteady but slightly improving.  He is tolerating his medications.  Memory stable.  PRIOR HPI 83 year old male here for evaluation of memory loss.  Patient has had at least 3 to 4 years of gradual onset progressive short-term memory loss, confusion, cognitive decline.  He previously diagnosed with vascular dementia.  Past 2 years patient has had intermittent staring spells, zoning out spells, 3-4 times per month.  Sometimes associated with lightheadedness, dizziness and presyncope symptoms.  One time patient went to the hospital for evaluation and was found to have hypotension.  His blood pressure medicines have been adjusted and blood pressure has been improved since August 2019 and patient has not had any of these staring spells since that time.   REVIEW OF SYSTEMS: Full 14 system review of systems performed and negative with exception of: as per HPI.  ALLERGIES: Allergies  Allergen Reactions   Penicillins Hives   Levetiracetam  Other (See Comments)    Altered mental status   Aricept  [Donepezil ] Other (See Comments)    Dizziness    Exelon [Rivastigmine] Other (See Comments)    Malaise    Viagra [Sildenafil] Palpitations and Other (See Comments)    Dizziness, also     HOME MEDICATIONS: Outpatient Medications Prior to Visit  Medication Sig Dispense Refill    acetaminophen  (TYLENOL ) 325 MG tablet Place 2 tablets (650 mg total) into feeding tube 3 (three) times daily as needed for moderate pain (pain score 4-6).     allopurinol  (ZYLOPRIM ) 100 MG tablet Place 1 tablet (100 mg total) into feeding tube daily.     brimonidine  (ALPHAGAN ) 0.2 % ophthalmic solution Place 1 drop into both eyes 2 (two) times daily. 5 mL 12   clopidogrel  (PLAVIX ) 75 MG tablet Place 1 tablet (75 mg total) into feeding tube daily. 30 tablet 0   dorzolamide  (TRUSOPT ) 2 % ophthalmic solution Place 1 drop into both eyes 2 (two) times daily.     galantamine  (RAZADYNE ) 4 MG tablet Place 1 tablet (4 mg total) into feeding tube daily with breakfast. 30 tablet 0   latanoprost  (XALATAN ) 0.005 % ophthalmic solution Place 1 drop into both eyes at bedtime.     melatonin 3 MG TABS tablet Place 1 tablet (3 mg total) into feeding tube at bedtime.     memantine  (NAMENDA ) 10 MG tablet Place 1 tablet (10 mg total) into feeding tube 2 (two) times daily. 60 tablet 0   methylphenidate  (RITALIN ) 10 MG tablet Place 1 tablet (10 mg total) into feeding tube 2 (two) times daily with breakfast and lunch. 60 tablet 0   Multiple Vitamin (MULTIVITAMIN WITH MINERALS) TABS tablet Place 1 tablet into feeding tube daily.     Nutritional Supplements (FEEDING SUPPLEMENT, JEVITY 1.5 CAL/FIBER,) LIQD Place 237 mLs into feeding tube 5 (five) times daily.     Nystatin (GERHARDT'S BUTT CREAM) CREA Apply 1 Application topically 2 (two) times daily. 60 each 0   Water  For Irrigation, Sterile (FREE WATER ) SOLN Place 300 mLs into feeding tube 4 (four) times daily - after meals and at bedtime.     cephALEXin  (KEFLEX ) 500 MG capsule Place 1 capsule (500 mg total) into feeding tube every 12 (twelve) hours. 12 capsule 0   No facility-administered medications prior to visit.    PAST MEDICAL HISTORY: Past Medical History:  Diagnosis Date   AMS (altered mental status) 03/15/2023   ARF (acute renal failure) 09/30/2023   BPH  (benign prostatic hyperplasia)    Cerebral infarction (HCC) 09/13/2012   IMO SNOMED Dx Update Oct 2024     Cerebrovascular disease    Clostridium difficile diarrhea    Congenital anomaly of diaphragm    COVID-19 08/2023   Elevated PSA    Glaucoma, both eyes    Hemorrhoid    Hepatitis B surface antigen positive    02-20-2011   History of adenomatous polyp of colon    2007, 2009 and 2013  tubular adenoma's   History of alcohol  abuse    quit 1963   History of cerebral parenchymal hemorrhage    01/ 2006  left occiptial lobe related to hypertensive crisis   History of CVA (cerebrovascular accident)    09-12-2012  left hippocampus/ amygdala junction and per MRI old white matter infarcts--  per pt residual short- term memory issues   History of fatty infiltration of liver hx visit's  at 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    Urethral lesion    urethral mass   UTI (urinary tract infection) 03/15/2023    PAST SURGICAL HISTORY: Past Surgical History:  Procedure Laterality Date   CARDIOVASCULAR STRESS TEST  05/05/2007   normal nuclear study w/ no ischemia/  normal LV fucntion and wall motion , ef60%   COLONOSCOPY  last one 04-06-2012   CYSTO/  LEFT RETROGRADE PYELOGRAM/ CYTOLOGY WASHINGS/  URETEROSCOPY  03/05/2000   INGUINAL HERNIA REPAIR Bilateral 1965 and 1980's   IR 3D INDEPENDENT WKST  07/31/2022   IR ANGIOGRAM PELVIS SELECTIVE OR SUPRASELECTIVE  07/31/2022   IR ANGIOGRAM SELECTIVE EACH ADDITIONAL VESSEL  07/31/2022   IR ANGIOGRAM SELECTIVE EACH ADDITIONAL VESSEL  07/31/2022   IR ANGIOGRAM SELECTIVE EACH ADDITIONAL VESSEL  07/31/2022   IR  ANGIOGRAM SELECTIVE EACH ADDITIONAL VESSEL  07/31/2022   IR EMBO TUMOR ORGAN ISCHEMIA INFARCT INC GUIDE ROADMAPPING  07/31/2022   IR GASTROSTOMY TUBE MOD SED  01/19/2024   IR RADIOLOGIST EVAL & MGMT  06/26/2022   IR RADIOLOGIST EVAL & MGMT  08/08/2022   IR RADIOLOGIST EVAL & MGMT  08/21/2022   IR RADIOLOGIST EVAL & MGMT  03/25/2023   IR US  GUIDE VASC ACCESS RIGHT  07/31/2022   LAPAROSCOPIC INGUINAL HERNIA WITH UMBILICAL HERNIA Right 06/24/2007   LIVER BIOPSY  1980's and 2004   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   Procedure: PACEMAKER IMPLANT;  Surgeon: Waddell Danelle ORN, MD;  Location: [facility];  Service: Cardiovascular;  Laterality: N/A;   SVT ABLATION N/A 11/15/2018   Procedure: SVT ABLATION;  Surgeon: Waddell Danelle ORN, MD;  Location: [facility];  Service: Cardiovascular;  Laterality: N/A;   TRANSTHORACIC ECHOCARDIOGRAM  09/13/2012   moderate LVH,  ef 60-65%/     TRANSURETHRAL RESECTION OF BLADDER TUMOR N/A 08/11/2016   Procedure: TRANSURETHRAL RESECTION OF BLADDER TUMOR (TURBT);  Surgeon: Belvie LITTIE Clara, MD;  Location: [facility];  Service: Urology;  Laterality: N/A;    FAMILY HISTORY: Family History  Problem Relation Age of Onset   Rheum arthritis Mother    Diabetes Mother    Stroke Mother    Heart attack Mother    Kidney failure Mother    Heart attack Father    Heart disease Maternal Grandmother    Rheum arthritis Maternal Grandmother    Diabetes Maternal Grandmother    Stroke Maternal Grandmother    Colon cancer Neg Hx     SOCIAL HISTORY: Social History   Socioeconomic History   Marital status: Married    Spouse name: Apolinar   Number of children: 1   Years of education: College   Highest education level: Not on file  Occupational History   Occupation: Environmental manager  Tobacco Use   Smoking status: Former    Current packs/day: 0.00    Average packs/day: 1 pack/day for 5.0 years (5.0 ttl pk-yrs)    Types: Cigarettes    Start date:  11/30/1976    Quit date: 11/30/1981  Years since quitting: 42.3   Smokeless tobacco: Never  Vaping Use   Vaping status: Never Used  Substance and Sexual Activity   Alcohol  use: No    Alcohol /week: 0.0 standard drinks of alcohol     Comment: hx abuse -- quit:  1963   Drug use: No    Comment: 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   Caffeine Use:  Tea, lots   Social Drivers of Health   Financial Resource Strain: Not on file  Food Insecurity: Patient Unable To Answer (12/31/2023)   Hunger Vital Sign    Worried About Running Out of Food in the Last Year: Patient unable to answer    Ran Out of Food in the Last Year: Patient unable to answer  Transportation Needs: Patient Unable To Answer (12/31/2023)   PRAPARE - Transportation    Lack of Transportation (Medical): Patient unable to answer    Lack of Transportation (Non-Medical): Patient unable to answer  Physical Activity: Not on file  Stress: Not on file  Social Connections: Patient Unable To Answer (12/31/2023)   Social Connection and Isolation Panel    Frequency of Communication with Friends and Family: Patient unable to answer    Frequency of Social Gatherings with Friends and Family: Patient unable to answer    Attends Religious Services: Patient unable to answer    Active Member of Clubs or Organizations: Patient unable to answer    Attends Banker Meetings: Patient unable to answer    Marital Status: Patient unable to answer  Intimate Partner Violence: Patient Unable To Answer (12/31/2023)   Humiliation, Afraid, Rape, and Kick questionnaire    Fear of Current or Ex-Partner: Patient unable to answer    Emotionally Abused: Patient unable to answer    Physically Abused: Patient unable to answer    Sexually Abused: Patient unable to answer     PHYSICAL EXAM  GENERAL  EXAM/CONSTITUTIONAL: Vitals:  Vitals:   03/21/24 1130  BP: 118/84  Pulse: (!) 118  Height: 5' 8 (1.727 m)   Body mass index is 18.44 kg/m. Wt Readings from Last 3 Encounters:  01/18/24 121 lb 4.1 oz (55 kg)  12/31/23 149 lb (67.6 kg)  11/10/23 151 lb 9.1 oz (68.8 kg)   Patient is in no distress; well developed, nourished and groomed; neck is supple  CARDIOVASCULAR: Examination of carotid arteries is normal; no carotid bruits Regular rate and rhythm, no murmurs Examination of peripheral vascular system by observation and palpation is normal  EYES: Ophthalmoscopic exam of optic discs and posterior segments is normal; no papilledema or hemorrhages No results found.  MUSCULOSKELETAL: Gait, strength, tone, movements noted in Neurologic exam below  NEUROLOGIC: MENTAL STATUS:     08/14/2020   10:11 AM 05/14/2020   12:53 PM 03/14/2019    2:04 PM  MMSE - Mini Mental State Exam  Orientation to time 4 5 5   Orientation to Place 4 4 4   Registration 3 3 3   Attention/ Calculation 3 3 4   Recall 3 3 3   Language- name 2 objects 2 2 2   Language- repeat 1 1 1   Language- follow 3 step command 3 3 3   Language- read & follow direction 1 1 1   Write a sentence 1 1 1   Copy design 0 0 1  Total score 25 26  28   awake, alert, oriented to person DECR MEMORY DECR attention and concentration DECR FLUENCY fund of knowledge appropriate ABLE TO COUNT FINGERS AND FOLLOW SIMPLE COMMANDS  CRANIAL NERVE:  2nd, 3rd, 4th, 6th - pupils equal and reactive to light, visual fields --> DECR IN RIGHT FIELD; CANNOT READ WORDS; ABLE TO SEE SOME LETTERS ON LEFT AND PARTIALLY COUNT FINGERS ON THE LEFT 5th - facial sensation symmetric 7th - facial strength symmetric 8th - hearing intact 9th - palate elevates symmetrically, uvula midline 11th - shoulder shrug symmetric 12th - tongue protrusion midline  MOTOR:  RUE 2-3; LUE 1-2 BLE 0-1 PROX; 1-2 DISTAL (R . L) normal bulk and tone, full strength in  the BUE, BLE  SENSORY:  normal and symmetric to light touch, temperature, vibration  COORDINATION:  finger-nose-finger, fine finger movements normal  REFLEXES:  deep tendon reflexes TRACE and symmetric  GAIT/STATION:  narrow based gait; USING WALKER      DIAGNOSTIC DATA (LABS, IMAGING, TESTING) - I reviewed patient records, labs, notes, testing and imaging myself where available.  Lab Results  Component Value Date   WBC 5.0 03/05/2024   HGB 17.4 (H) 03/05/2024   HCT 53.7 (H) 03/05/2024   MCV 95.4 03/05/2024   PLT 165 03/05/2024      Component Value Date/Time   NA 140 03/05/2024 1656   NA 143 11/12/2018 0950   K 4.2 03/05/2024 1656   CL 101 03/05/2024 1656   CO2 27 03/05/2024 1656   GLUCOSE 79 03/05/2024 1656   BUN 20 03/05/2024 1656   BUN 15 11/12/2018 0950   CREATININE 0.77 03/05/2024 1656   CALCIUM  10.0 03/05/2024 1656   PROT 6.3 (L) 01/29/2024 0456   ALBUMIN 2.7 (L) 01/29/2024 0456   AST 41 01/29/2024 0456   ALT 65 (H) 01/29/2024 0456   ALKPHOS 68 01/29/2024 0456   BILITOT 0.5 01/29/2024 0456   GFRNONAA >60 03/05/2024 1656   GFRAA 60 (L) 02/28/2020 1152   Lab Results  Component Value Date   CHOL 115 12/31/2023   HDL 41 12/31/2023   LDLCALC 64 12/31/2023   TRIG 51 12/31/2023   CHOLHDL 2.8 12/31/2023   Lab Results  Component Value Date   HGBA1C 5.2 12/31/2023   Lab Results  Component Value Date   VITAMINB12 431 06/28/2013   Lab Results  Component Value Date   TSH 1.267 09/30/2023    12/30/23 MRI brain [I reviewed images myself and agree with interpretation. -VRP]  1. Acute infarct in the right basal ganglia. 2. Remote left PCA territory infarcts and additional small remote infarcts in the right occipital cortex within the right PCA territory. 3. 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 4. Moderate generalized parenchymal volume loss.   ASSESSMENT AND PLAN  83 y.o. year old male  here with gradual onset progressive short-term memory loss confusion and decline in ADLs, consistent with mild dementia.  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  Abnormal spells of staring and zoning out are could be presyncope and hypotension related and possible amyloid spells.   Dx:  1. Recurrent strokes (HCC)   2. Moderate late onset Alzheimer's dementia without behavioral disturbance, psychotic disturbance, mood disturbance, or anxiety (HCC)   3. Cerebral amyloid angiopathy (HCC)     PLAN:  RECURRENT STROKE (embolic; atrial flutter not on anticoagulation due to recurrent ICH and CAA) - continue clopidogrel  75 daily - continue supportive care at home   Hx of small left occipital ICH  06/2004 CAA diagnosed in 2021 History of ischemic strokes --> 2014, 2020, 2022, 2024, 2025 Acute microhemorrhage on MRI brain 03/2021 Atrial flutter history not on AC due to history of CAA with ICH CAA spells with episode of unresponsiveness, extensive unremarkable work up including neuroimaging and EEGs in the past Spells described as transient confusion and aphasia   Atrial flutter Home Meds: None due to CAA with multiple chronic microhemorrhages and history of ICH   DEMENTIA (VASCULAR + neurodegenerative / alzheimer's) - continue memantine  (previously tried donepezil ) - safety / supervision issues reviewed  Return for return to PCP, pending if symptoms worsen or fail to improve.    EDUARD FABIENE HANLON, MD 03/21/2024, 11:52 AM Certified in Neurology, Neurophysiology and Neuroimaging  [facility] [address] [place], [place] [address] [phone]

## 2024-03-30 ENCOUNTER — Telehealth: Payer: Self-pay | Admitting: Physical Medicine & Rehabilitation

## 2024-03-30 NOTE — Telephone Encounter (Signed)
 Pt, wife called and requested if someone else can see her husband since you'd be out on 11.14.

## 2024-04-01 ENCOUNTER — Encounter: Payer: Self-pay | Admitting: Physical Medicine & Rehabilitation

## 2024-04-08 ENCOUNTER — Ambulatory Visit (INDEPENDENT_AMBULATORY_CARE_PROVIDER_SITE_OTHER): Payer: Medicare PPO

## 2024-04-08 DIAGNOSIS — I442 Atrioventricular block, complete: Secondary | ICD-10-CM | POA: Diagnosis not present

## 2024-04-10 LAB — CUP PACEART REMOTE DEVICE CHECK
Date Time Interrogation Session: 20251107093030
Implantable Lead Connection Status: 753985
Implantable Lead Connection Status: 753985
Implantable Lead Implant Date: 20211227
Implantable Lead Implant Date: 20211227
Implantable Lead Location: 753859
Implantable Lead Location: 753860
Implantable Lead Model: 377
Implantable Lead Model: 377
Implantable Lead Serial Number: 8000018960
Implantable Lead Serial Number: 8000042643
Implantable Pulse Generator Implant Date: 20211227
Pulse Gen Model: 407145
Pulse Gen Serial Number: 69915927

## 2024-04-11 ENCOUNTER — Ambulatory Visit: Payer: Self-pay | Admitting: Internal Medicine

## 2024-04-12 NOTE — Progress Notes (Signed)
 Remote PPM Transmission

## 2024-04-15 ENCOUNTER — Encounter: Admitting: Physical Medicine & Rehabilitation

## 2024-04-18 ENCOUNTER — Emergency Department (HOSPITAL_COMMUNITY)

## 2024-04-18 ENCOUNTER — Encounter: Payer: Self-pay | Admitting: Registered Nurse

## 2024-04-18 ENCOUNTER — Emergency Department (HOSPITAL_COMMUNITY)
Admission: EM | Admit: 2024-04-18 | Discharge: 2024-04-18 | Disposition: A | Discharge status: Home / Self Care | Attending: Emergency Medicine | Admitting: Emergency Medicine

## 2024-04-18 ENCOUNTER — Other Ambulatory Visit: Payer: Self-pay

## 2024-04-18 ENCOUNTER — Encounter: Attending: Registered Nurse | Admitting: Registered Nurse

## 2024-04-18 VITALS — BP 103/73 | HR 130 | Temp 98.0°F | Ht 71.0 in | Wt 150.0 lb

## 2024-04-18 DIAGNOSIS — Z950 Presence of cardiac pacemaker: Secondary | ICD-10-CM | POA: Diagnosis not present

## 2024-04-18 DIAGNOSIS — R739 Hyperglycemia, unspecified: Secondary | ICD-10-CM | POA: Diagnosis not present

## 2024-04-18 DIAGNOSIS — Z7902 Long term (current) use of antithrombotics/antiplatelets: Secondary | ICD-10-CM | POA: Diagnosis not present

## 2024-04-18 DIAGNOSIS — E871 Hypo-osmolality and hyponatremia: Secondary | ICD-10-CM | POA: Diagnosis not present

## 2024-04-18 DIAGNOSIS — Z8673 Personal history of transient ischemic attack (TIA), and cerebral infarction without residual deficits: Secondary | ICD-10-CM | POA: Diagnosis not present

## 2024-04-18 DIAGNOSIS — F0390 Unspecified dementia without behavioral disturbance: Secondary | ICD-10-CM | POA: Insufficient documentation

## 2024-04-18 DIAGNOSIS — I6381 Other cerebral infarction due to occlusion or stenosis of small artery: Secondary | ICD-10-CM

## 2024-04-18 DIAGNOSIS — M79642 Pain in left hand: Secondary | ICD-10-CM | POA: Insufficient documentation

## 2024-04-18 DIAGNOSIS — I10 Essential (primary) hypertension: Secondary | ICD-10-CM | POA: Insufficient documentation

## 2024-04-18 DIAGNOSIS — Z79899 Other long term (current) drug therapy: Secondary | ICD-10-CM | POA: Diagnosis not present

## 2024-04-18 DIAGNOSIS — M79602 Pain in left arm: Secondary | ICD-10-CM | POA: Insufficient documentation

## 2024-04-18 DIAGNOSIS — R000 Tachycardia, unspecified: Secondary | ICD-10-CM | POA: Insufficient documentation

## 2024-04-18 DIAGNOSIS — M79601 Pain in right arm: Secondary | ICD-10-CM | POA: Insufficient documentation

## 2024-04-18 DIAGNOSIS — E860 Dehydration: Secondary | ICD-10-CM | POA: Insufficient documentation

## 2024-04-18 DIAGNOSIS — M79605 Pain in left leg: Secondary | ICD-10-CM | POA: Insufficient documentation

## 2024-04-18 DIAGNOSIS — I69354 Hemiplegia and hemiparesis following cerebral infarction affecting left non-dominant side: Secondary | ICD-10-CM | POA: Insufficient documentation

## 2024-04-18 LAB — CBC
HCT: 55.3 % — ABNORMAL HIGH (ref 39.0–52.0)
Hemoglobin: 18.3 g/dL — ABNORMAL HIGH (ref 13.0–17.0)
MCH: 30.9 pg (ref 26.0–34.0)
MCHC: 33.1 g/dL (ref 30.0–36.0)
MCV: 93.3 fL (ref 80.0–100.0)
Platelets: 159 K/uL (ref 150–400)
RBC: 5.93 MIL/uL — ABNORMAL HIGH (ref 4.22–5.81)
RDW: 15.4 % (ref 11.5–15.5)
WBC: 8 K/uL (ref 4.0–10.5)
nRBC: 0 % (ref 0.0–0.2)

## 2024-04-18 LAB — COMPREHENSIVE METABOLIC PANEL WITH GFR
ALT: 41 U/L (ref 0–44)
AST: 34 U/L (ref 15–41)
Albumin: 3.2 g/dL — ABNORMAL LOW (ref 3.5–5.0)
Alkaline Phosphatase: 76 U/L (ref 38–126)
Anion gap: 11 (ref 5–15)
BUN: 23 mg/dL (ref 8–23)
CO2: 24 mmol/L (ref 22–32)
Calcium: 9.5 mg/dL (ref 8.9–10.3)
Chloride: 98 mmol/L (ref 98–111)
Creatinine, Ser: 0.79 mg/dL (ref 0.61–1.24)
GFR, Estimated: >60 mL/min (ref >60)
Glucose, Bld: 177 mg/dL — ABNORMAL HIGH (ref 70–99)
Potassium: 4.2 mmol/L (ref 3.5–5.1)
Sodium: 133 mmol/L — ABNORMAL LOW (ref 135–145)
Total Bilirubin: 0.5 mg/dL (ref 0.0–1.2)
Total Protein: 7.7 g/dL (ref 6.5–8.1)

## 2024-04-18 LAB — URINALYSIS, ROUTINE W REFLEX MICROSCOPIC
Bacteria, UA: NONE SEEN
Bilirubin Urine: NEGATIVE
Glucose, UA: NEGATIVE mg/dL
Hgb urine dipstick: NEGATIVE
Ketones, ur: NEGATIVE mg/dL
Nitrite: NEGATIVE
Protein, ur: 100 mg/dL — AB
Specific Gravity, Urine: 1.011 (ref 1.005–1.030)
pH: 6 (ref 5.0–8.0)

## 2024-04-18 LAB — MAGNESIUM: Magnesium: 2.2 mg/dL (ref 1.7–2.4)

## 2024-04-18 MED ORDER — SODIUM CHLORIDE 0.9 % IV BOLUS
1000.0000 mL | Freq: Once | INTRAVENOUS | Status: AC
  Administered 2024-04-18: 1000 mL via INTRAVENOUS

## 2024-04-18 NOTE — ED Notes (Signed)
 [facility] called

## 2024-04-18 NOTE — Progress Notes (Signed)
 Subjective:    Patient ID: Dustin Hamilton, male    DOB: 26-Dec-1940, 83 y.o.   MRN: [id number]  HPI: Dustin Hamilton is a 83 y.o. male who returns for HFU appointment of his Stroke of right basal ganglia and hypertension. He arrived to office tachycardic.   He arrived to [facility] on 12/30/2023 with complaints of left sided weakness  Dr Seena: H&P HPI: Dustin Hamilton is a 83 y.o. male with medical history significant of hypertension, hyperlipidemia, TIA, CVA, amyloid angiopathy, paroxysmal SVT status post ablation, complete heart block status post pacemaker, secondary parkinsonism, dementia, depression, OSA, gout, BPH, glaucoma, chronic pain presenting with left-sided deficits as a code stroke.   Patient's last normal was last night.  Woke up this morning with left facial droop, left-sided weakness, right gaze deviation, unable to speak.  At baseline he is confused but is able to walk, typically able to feed self and have short conversations per wife.   Patient unable to participate in review of systems at this time due to dementia and speech issues.   CT Head: WO Contrast IMPRESSION: 1. No acute cortically based infarct or acute intracranial hemorrhage identified. ASPECTS 10. 2.  Stable non contrast CT appearance of chronic ischemic disease. 3. These results were communicated to Dr. Vanessa at 10:57 am on 12/30/2023 by text page via the [facility] messaging system.  CTA: IMPRESSION: 1. New focal moderate stenosis of the distal M1 segment of the right MCA. 2. Redemonstrated occlusion of the proximal P2 segment of the left PCA with reconstitution at the mid P2 segment. 3. Focal moderate stenosis of the V4 segment right vertebral artery. 4. Mild atherosclerosis at the right and left carotid bifurcations without hemodynamically significant stenosis.  MR Brain: WO Contrast IMPRESSION: 1. Acute infarct in the right basal ganglia. 2. Remote left PCA territory infarcts and  additional small remote infarcts in the right occipital cortex within the right PCA territory. 3. 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 4. Moderate generalized parenchymal volume loss. Neurology was consulted and recommended: DAPT x3 weeks  followed by Plavix  alone.   [name] was admitted to inpatient Rehabilitation on 01/04/2024 and discharged home on 01/29/2024. He arrived to office today tachytcardia, apical pulse checked and medication list was reviewed. He will go to [facility] ED for evaluation, EMS called. Wife and personal care aide in room.  [name] denies any pain at this time. He rated his pain 6 on Health and History formed.   He arrived in wheelchair /          Pain Inventory Average Pain 6 Pain Right Now 6 My pain is intermittent and unspecified all over pain  LOCATION OF PAIN  bilateral arms, left hand, thigh and lower leg pain  BOWEL Number of stools per week: 7 Oral laxative use No  Type of laxative No answer Enema or suppository use No  History of colostomy No  Incontinent Yes   BLADDER Foley In and out cath, frequency condom catherter Able to self cath No  Bladder incontinence Yes  Frequent urination Yes  Leakage with coughing No  Difficulty starting stream No  Incomplete bladder emptying No    Mobility use a wheelchair needs help with transfers Do you have any goals in this area?  yes  Function retired  Neuro/Psych bladder control problems weakness numbness trouble walking confusion  Prior Studies Any changes since last visit?  yes  Physicians involved in your care  Primary care [facility]   Family History  Problem Relation Age of Onset   Rheum arthritis Mother    Diabetes Mother    Stroke Mother    Heart attack Mother    Kidney failure Mother    Heart attack Father    Heart disease Maternal Grandmother    Rheum arthritis Maternal Grandmother     Diabetes Maternal Grandmother    Stroke Maternal Grandmother    Colon cancer Neg Hx    Social History   Socioeconomic History   Marital status: Married    Spouse name: Apolinar   Number of children: 1   Years of education: College   Highest education level: Not on file  Occupational History   Occupation: environmental manager  Tobacco Use   Smoking status: Former    Current packs/day: 0.00    Average packs/day: 1 pack/day for 5.0 years (5.0 ttl pk-yrs)    Types: Cigarettes    Start date: 11/30/1976    Quit date: 11/30/1981    Years since quitting: 42.4   Smokeless tobacco: Never  Vaping Use   Vaping status: Never Used  Substance and Sexual Activity   Alcohol  use: No    Alcohol /week: 0.0 standard drinks of alcohol     Comment: hx abuse -- quit:  1963   Drug use: No    Comment: 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   Caffeine Use:  Tea, lots   Social Drivers of Health   Financial Resource Strain: Not on file  Food Insecurity: Patient Unable To Answer (12/31/2023)   Hunger Vital Sign    Worried About Running Out of Food in the Last Year: Patient unable to answer    Ran Out of Food in the Last Year: Patient unable to answer  Transportation Needs: Patient Unable To Answer (12/31/2023)   PRAPARE - Transportation    Lack of Transportation (Medical): Patient unable to answer    Lack of Transportation (Non-Medical): Patient unable to answer  Physical Activity: Not on file  Stress: Not on file  Social Connections: Patient Unable To Answer (12/31/2023)   Social Connection and Isolation Panel    Frequency of Communication with Friends and Family: Patient unable to answer    Frequency of Social Gatherings with Friends and Family: Patient unable to answer    Attends Religious Services: Patient unable to answer    Active Member of Clubs or Organizations:  Patient unable to answer    Attends Banker Meetings: Patient unable to answer    Marital Status: Patient unable to answer   Past Surgical History:  Procedure Laterality Date   CARDIOVASCULAR STRESS TEST  05/05/2007   normal nuclear study w/ no ischemia/  normal LV fucntion and wall motion , ef60%   COLONOSCOPY  last one 04-06-2012   CYSTO/  LEFT RETROGRADE PYELOGRAM/ CYTOLOGY WASHINGS/  URETEROSCOPY  03/05/2000   INGUINAL HERNIA REPAIR Bilateral 1965 and 1980's   IR 3D INDEPENDENT WKST  07/31/2022   IR ANGIOGRAM PELVIS SELECTIVE OR SUPRASELECTIVE  07/31/2022   IR ANGIOGRAM SELECTIVE EACH ADDITIONAL VESSEL  07/31/2022   IR ANGIOGRAM SELECTIVE EACH ADDITIONAL VESSEL  07/31/2022   IR ANGIOGRAM SELECTIVE EACH ADDITIONAL VESSEL  07/31/2022   IR ANGIOGRAM SELECTIVE EACH ADDITIONAL VESSEL  07/31/2022   IR EMBO TUMOR ORGAN ISCHEMIA INFARCT INC GUIDE  ROADMAPPING  07/31/2022   IR GASTROSTOMY TUBE MOD SED  01/19/2024   IR RADIOLOGIST EVAL & MGMT  06/26/2022   IR RADIOLOGIST EVAL & MGMT  08/08/2022   IR RADIOLOGIST EVAL & MGMT  08/21/2022   IR RADIOLOGIST EVAL & MGMT  03/25/2023   IR US  GUIDE VASC ACCESS RIGHT  07/31/2022   LAPAROSCOPIC INGUINAL HERNIA WITH UMBILICAL HERNIA Right 06/24/2007   LIVER BIOPSY  1980's and 2004   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   Procedure: PACEMAKER IMPLANT;  Surgeon: Waddell Danelle ORN, MD;  Location: [facility];  Service: Cardiovascular;  Laterality: N/A;   SVT ABLATION N/A 11/15/2018   Procedure: SVT ABLATION;  Surgeon: Waddell Danelle ORN, MD;  Location: [facility];  Service: Cardiovascular;  Laterality: N/A;   TRANSTHORACIC ECHOCARDIOGRAM  09/13/2012   moderate LVH,  ef 60-65%/     TRANSURETHRAL RESECTION OF BLADDER TUMOR N/A 08/11/2016   Procedure: TRANSURETHRAL RESECTION OF BLADDER TUMOR (TURBT);  Surgeon: Belvie LITTIE Clara, MD;  Location: [facility];  Service: Urology;  Laterality: N/A;   Past Medical History:   Diagnosis Date   AMS (altered mental status) 03/15/2023   ARF (acute renal failure) 09/30/2023   BPH (benign prostatic hyperplasia)    Cerebral infarction (HCC) 09/13/2012   IMO SNOMED Dx Update Oct 2024     Cerebrovascular disease    Clostridium difficile diarrhea    Congenital anomaly of diaphragm    COVID-19 08/2023   Elevated PSA    Glaucoma, both eyes    Hemorrhoid    Hepatitis B surface antigen positive    02-20-2011   History of adenomatous polyp of colon    2007, 2009 and 2013  tubular adenoma's   History of alcohol  abuse    quit 1963   History of cerebral parenchymal hemorrhage    01/ 2006  left occiptial lobe related to hypertensive crisis   History of CVA (cerebrovascular accident)    09-12-2012  left hippocampus/ amygdala junction and per MRI old white matter infarcts--  per pt residual short- term memory issues   History of fatty infiltration of liver hx visit's at 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    Urethral lesion    urethral mass   UTI (urinary tract infection) 03/15/2023   BP 103/73 (BP Location: Right Arm, Patient Position: Sitting, Cuff Size: Large)   Pulse (!) 118   Ht 5' 11 (1.803 m)   Wt 150 lb (68 kg) Comment: average weight  SpO2 93%   BMI 20.92 kg/m   Opioid Risk Score:   Fall Risk Score:  `1  Depression screen PHQ 2/9     04/18/2024    1:27 PM 09/15/2012   10:00 AM  Depression screen PHQ 2/9  Decreased Interest 2 0  Down, Depressed, Hopeless 1 0  PHQ - 2 Score 3 0  Altered sleeping 2   Tired, decreased energy 2   Change in appetite 0   Feeling bad or failure about yourself  1   Trouble  concentrating 3   Moving slowly or fidgety/restless 3   PHQ-9 Score 14       Review of Systems  Genitourinary:        Condom cath in place  Musculoskeletal:  Positive for myalgias.       Bilateral arm pain, left hand pain, left thigh and lower leg pain  Neurological:  Positive for speech difficulty, weakness and numbness.       Objective:   Physical Exam Vitals and nursing note reviewed.  Cardiovascular:     Rate and Rhythm: Tachycardia present.     Pulses: Normal pulses.     Heart sounds: Normal heart sounds.  Pulmonary:     Effort: Pulmonary effort is normal.     Breath sounds: Normal breath sounds.  Musculoskeletal:     Comments: Normal Muscle Bulk and Muscle Testing Reveals:  Upper Extremities: Right: Decreased ROM : 45 Degrees and Muscle Strength 3/5 Left Upper Extremity: Paralysis  and Muscle Strength 0/5  Lower Extremities: Right: Decreased ROM and Muscle Strength 3/5 Left Lower Extremity: Paralysis Arrived in wheelchair     Skin:    General: Skin is warm and dry.  Neurological:     Mental Status: He is alert.  Psychiatric:        Mood and Affect: Mood normal.        Behavior: Behavior normal.          Assessment & Plan:  Stroke of right basal ganglia: Wife states his Home Health was completed and he has a scheduled appointment with Neurology. Continue to monitor.  Essential hypertension: Continue  current medication regimen. PCP following. Continue to monitor.  Tachycardic. : EMS called and he will be evaluated at [facility] ED.   F/U with Dr Carilyn in 4-6 weeks

## 2024-04-18 NOTE — ED Notes (Signed)
 [place] arriving with pt from PCP - lives at home with 24 hour care and lives with wife - Had tachycardia and was sent by PCP due to this. The patient is on ritalin .   Left sided weakness at baseline.   AxOx3 disoriented to situation at baseline.    HR - ST 116 BP - 116/76 O2 - 97 on RA CBG - 196 RR - 16

## 2024-04-18 NOTE — Discharge Instructions (Addendum)
 Please follow-up with his primary care doctor to discuss adjusting his Ritalin  medication, or increasing the amount of fluids for his tube feeds if his tachycardia returns, his workup was reassuring today and his heart rate returned to normal rate.

## 2024-04-18 NOTE — ED Provider Notes (Signed)
 [place] EMERGENCY DEPARTMENT AT [facility] Provider Note   CSN: [id number] Arrival date & time: 04/18/24  1546     Patient presents with: Tachycardia   Dustin Hamilton is a 83 y.o. male with past medical history significant for hypertension, BPH, recent basal ganglia stroke, remote history of alcohol , drug use, dementia, heart block with pacemaker who presents from PCP with asymptomatic tachycardia.  Was started on Ritalin  for cognition during his admission for stroke, reportedly with elevated heart rate persistently since discharge.   HPI     Prior to Admission medications   Medication Sig Start Date End Date Taking? Authorizing Provider  acetaminophen  (TYLENOL ) 325 MG tablet Place 2 tablets (650 mg total) into feeding tube 3 (three) times daily as needed for moderate pain (pain score 4-6). 01/20/24   Angiulli, Toribio PARAS, PA-C  allopurinol  (ZYLOPRIM ) 100 MG tablet Place 1 tablet (100 mg total) into feeding tube daily. 01/04/24 01/03/25  Gherghe, Costin M, MD  brimonidine  (ALPHAGAN ) 0.2 % ophthalmic solution Place 1 drop into both eyes 2 (two) times daily. 10/19/22   Sridharan, Sriramkumar, MD  clopidogrel  (PLAVIX ) 75 MG tablet Place 1 tablet (75 mg total) into feeding tube daily. 01/29/24   Love, Sharlet RAMAN, PA-C  dorzolamide  (TRUSOPT ) 2 % ophthalmic solution Place 1 drop into both eyes 2 (two) times daily.    [provider]  galantamine  (RAZADYNE ) 4 MG tablet Place 1 tablet (4 mg total) into feeding tube daily with breakfast. 01/20/24   Angiulli, Toribio PARAS, PA-C  latanoprost  (XALATAN ) 0.005 % ophthalmic solution Place 1 drop into both eyes at bedtime.    [provider]  melatonin 3 MG TABS tablet Place 1 tablet (3 mg total) into feeding tube at bedtime. 01/28/24   Love, Sharlet RAMAN, PA-C  memantine  (NAMENDA ) 10 MG tablet Place 1 tablet (10 mg total) into feeding tube 2 (two) times daily. 01/20/24   Angiulli, Toribio PARAS, PA-C  methylphenidate  (RITALIN ) 10 MG tablet  Place 1 tablet (10 mg total) into feeding tube 2 (two) times daily with breakfast and lunch. 01/29/24   Love, Sharlet RAMAN, PA-C  Multiple Vitamin (MULTIVITAMIN WITH MINERALS) TABS tablet Place 1 tablet into feeding tube daily. 01/28/24   Love, Sharlet RAMAN, PA-C  Nutritional Supplements (FEEDING SUPPLEMENT, JEVITY 1.5 CAL/FIBER,) LIQD Place 237 mLs into feeding tube 5 (five) times daily. 01/26/24   Love, Sharlet RAMAN, PA-C  Nystatin (GERHARDT'S BUTT CREAM) CREA Apply 1 Application topically 2 (two) times daily. 01/20/24   Angiulli, Toribio PARAS, PA-C  Water  For Irrigation, Sterile (FREE WATER ) SOLN Place 300 mLs into feeding tube 4 (four) times daily - after meals and at bedtime. 01/26/24   Maurice Sharlet RAMAN, PA-C    Allergies: Penicillins, Levetiracetam , Aricept  [donepezil ], Exelon [rivastigmine], and Viagra [sildenafil]    Review of Systems  All other systems reviewed and are negative.   Updated Vital Signs BP (!) 139/95   Pulse 85   Temp 98.5 F (36.9 C) (Oral)   Resp 20   SpO2 97%   Physical Exam Vitals and nursing note reviewed.  Constitutional:      General: He is not in acute distress.    Appearance: Normal appearance.  HENT:     Head: Normocephalic and atraumatic.  Eyes:     General:        Right eye: No discharge.        Left eye: No discharge.  Cardiovascular:     Rate and Rhythm: Regular rhythm. Tachycardia present.  Heart sounds: No murmur heard.    No friction rub. No gallop.  Pulmonary:     Effort: Pulmonary effort is normal.     Breath sounds: Normal breath sounds.  Abdominal:     General: Bowel sounds are normal.     Palpations: Abdomen is soft.  Skin:    General: Skin is warm and dry.     Capillary Refill: Capillary refill takes less than 2 seconds.  Neurological:     Mental Status: He is alert and oriented to person, place, and time.  Psychiatric:        Mood and Affect: Mood normal.        Behavior: Behavior normal.     (all labs ordered are listed, but only  abnormal results are displayed) Labs Reviewed  CBC - Abnormal; Notable for the following components:      Result Value   RBC 5.93 (*)    Hemoglobin 18.3 (*)    HCT 55.3 (*)    All other components within normal limits  COMPREHENSIVE METABOLIC PANEL WITH GFR - Abnormal; Notable for the following components:   Sodium 133 (*)    Glucose, Bld 177 (*)    Albumin 3.2 (*)    All other components within normal limits  URINALYSIS, ROUTINE W REFLEX MICROSCOPIC - Abnormal; Notable for the following components:   Color, Urine AMBER (*)    APPearance CLOUDY (*)    Protein, ur 100 (*)    Leukocytes,Ua MODERATE (*)    All other components within normal limits  URINE CULTURE  MAGNESIUM     EKG: None  Radiology: DG Chest Portable 1 View Result Date: 04/18/2024 CLINICAL DATA:  Shortness of breath, tachycardia. EXAM: PORTABLE CHEST 1 VIEW COMPARISON:  01/02/2024 and CT chest 05/01/2022. FINDINGS: Patient is rotated. Trachea is midline. Heart size stable. Pacemaker lead tips are in the right atrium and right ventricle. Lungs are somewhat low in volume with streaky scarring in the lung bases. No pleural fluid. Chronically elevated right hemidiaphragm. IMPRESSION: No acute findings. Electronically Signed   By: Newell Eke M.D.   On: 04/18/2024 17:11     Procedures   Medications Ordered in the ED  sodium chloride  0.9 % bolus 1,000 mL (1,000 mLs Intravenous New Bag/Given 04/18/24 1941)                                    Medical Decision Making Amount and/or Complexity of Data Reviewed Labs: ordered. Radiology: ordered.   This patient is a 83 y.o. male  who presents to the ED for concern of tachycardia.   Differential diagnoses prior to evaluation: The emergent differential diagnosis includes, but is not limited to,  The differential diagnosis for palpitations includes cardiac arrhythmias, PVC/PAC, ACS, Cardiomyopathy, CHF, MVP, pericarditis, valvular disease, Panic/Anxiety, Somatic  disorder, ETOH, Caffeine,  Stimulant use, medication side effect, Anemia, Hyperthyroidism, pulmonary embolism. This is not an exhaustive differential.   Past Medical History / Co-morbidities / Social History:  hypertension, BPH, recent basal ganglia stroke, remote history of alcohol , drug use, dementia, heart block with pacemaker  Additional history: Chart reviewed. Pertinent results include: Extensively reviewed lab work, imaging from recent previous hospital admissions.  Physical Exam: Physical exam performed. The pertinent findings include: Initially tachycardic, rate around 130 on arrival, proved to around 110-115, now around 97.  Lab Tests/Imaging studies: I personally interpreted labs/imaging and the pertinent results include: CBC with some hemoconcentration,  hemoglobin elevated 18.3, mild hyponatremia, sodium 133.  Mildly elevated glucose at 177.  Normal magnesium .  I independently interpreted plain film chest x-ray which shows no evidence of acute intrathoracic abnormality.. I agree with the radiologist interpretation.  Cardiac monitoring: EKG obtained and interpreted by myself and attending physician which shows: Sinus tachycardia, paced rhythm   Medications: I ordered medication including fluid bolus for tachycardia.  I have reviewed the patients home medicines and have made adjustments as needed. UA shows 11-20 white blood cells, moderate leukocytes but no bacteria.  Overall not very convincing for urinary tract infection.  Will obtain urine culture, otherwise since his tachycardia is resolved I feel that he is stable for discharge.   Disposition: After consideration of the diagnostic results and the patients response to treatment, I feel that overall suspect that patient's symptoms are related to mild dehydration, as well as stimulant medication.   emergency department workup does not suggest an emergent condition requiring admission or immediate intervention beyond what has been  performed at this time. The plan is: As above stable for discharge. The patient is safe for discharge and has been instructed to return immediately for worsening symptoms, change in symptoms or any other concerns.    Final diagnoses:  Tachycardia  Dehydration    ED Discharge Orders     None          Rosan Sherlean DEL, PA-C 04/18/24 2119    Pamella Ozell LABOR, DO 04/18/24 2332

## 2024-04-19 ENCOUNTER — Telehealth: Payer: Self-pay | Admitting: Registered Nurse

## 2024-04-19 NOTE — Telephone Encounter (Signed)
 Spoke with Dr Carilyn regarding Dustin Hamilton tachycardia and ED visit on 04/19/2024.  Dustin Hamilton was called she was instructed to call Dustin Hamilton PCP, since they are prescribing his Methylphenidate ,also insucted to F/U with Cardiology, she verbalizes understanding. Dustin Hamilton will call Dustin Hamilton caseworker relating to his home care, she reports.

## 2024-05-19 ENCOUNTER — Encounter: Admitting: Physical Medicine & Rehabilitation

## 2024-05-19 ENCOUNTER — Encounter: Payer: Self-pay | Admitting: Physical Medicine & Rehabilitation

## 2024-05-19 VITALS — BP 159/100 | HR 112

## 2024-05-19 DIAGNOSIS — Z8673 Personal history of transient ischemic attack (TIA), and cerebral infarction without residual deficits: Secondary | ICD-10-CM | POA: Diagnosis not present

## 2024-05-19 DIAGNOSIS — G20C Parkinsonism, unspecified: Secondary | ICD-10-CM | POA: Insufficient documentation

## 2024-05-19 DIAGNOSIS — J9811 Atelectasis: Secondary | ICD-10-CM | POA: Insufficient documentation

## 2024-05-19 DIAGNOSIS — Z950 Presence of cardiac pacemaker: Secondary | ICD-10-CM | POA: Insufficient documentation

## 2024-05-19 DIAGNOSIS — I63533 Cerebral infarction due to unspecified occlusion or stenosis of bilateral posterior cerebral arteries: Secondary | ICD-10-CM | POA: Diagnosis not present

## 2024-05-19 DIAGNOSIS — I471 Supraventricular tachycardia, unspecified: Secondary | ICD-10-CM | POA: Diagnosis not present

## 2024-05-19 DIAGNOSIS — F01C0 Vascular dementia, severe, without behavioral disturbance, psychotic disturbance, mood disturbance, and anxiety: Secondary | ICD-10-CM | POA: Diagnosis not present

## 2024-05-19 DIAGNOSIS — Z87891 Personal history of nicotine dependence: Secondary | ICD-10-CM | POA: Insufficient documentation

## 2024-05-19 DIAGNOSIS — F0283 Dementia in other diseases classified elsewhere, unspecified severity, with mood disturbance: Secondary | ICD-10-CM | POA: Diagnosis not present

## 2024-05-19 DIAGNOSIS — I639 Cerebral infarction, unspecified: Secondary | ICD-10-CM | POA: Insufficient documentation

## 2024-05-19 DIAGNOSIS — K760 Fatty (change of) liver, not elsewhere classified: Secondary | ICD-10-CM | POA: Insufficient documentation

## 2024-05-19 NOTE — Patient Instructions (Addendum)
°  ° ° ° ° ° ° ° ° ° ° ° ° ° ° ° ° ° ° ° ° ° °  Contains text generated by [employer].                                 Contains text generated by [employer].                                    Contains text generated by [employer].                                 Contains text generated by [employer].                   Contains text generated by [employer].                                 Contains text generated by [employer].                          Contains text generated by [employer]bridge.                                 Contains text generated by [employer]bridge.                       Contains text generated by [employer]bridge.                                 Contains text generated by [employer]bridge.                          Contains text generated by [employer]bridge.                                 Contains text generated by [employer]bridge.

## 2024-05-19 NOTE — Progress Notes (Signed)
 Subjective:    Patient ID: Dustin Hamilton, male    DOB: [birth year], 83 y.o.   MRN: [id number] Admit date: 01/04/2024 Discharge date: 01/29/2024  83 y.o. right-handed male with history significant for CVA, ICH, SVT, CHB status post PPM, fatty liver, parkinsonian-ism, dementia with sundowning, cerebellar angiopathy with multiple amyloid spells (transient focal neurological deficits), orthostatic hypotension with syncope.  Presented to the ED 12/30/2023 with decrease in the level of consciousness left-sided weakness right gaze deviation and left facial droop.  CTA head/neck showed new focal moderate stenosis of distal M1 segment of right MCA, old P2 segmental occlusion of left PCA and focal moderate stenosis of V4 segment of right-VA.  MRI of the brain revealed acute infarct in right basal ganglia with remote left PCA territory infarct, numerous chronic microhemorrhages consistent with history of cerebellar amyloid angiopathy and moderate generalized parenchymal volume loss.  Echocardiogram ejection fraction of 55 to 65% with moderate calcification of R-AV and negative for shunt but study limited due to body habitus   Dr. Rosemarie recommended DAPT x 3 weeks followed by Plavix  alone.  Hospital course significant for pseudo bulbar affect with nonverbal state, increased tone in all extremities with drift to the left, ability to intermittently follow simple commands, tends to keep eyes closed and opens mouth reflexively but with poor oral awareness and significant oral holding.  He was made n.p.o. started on tube feeds for nutritional support.  He did have decrease in responsiveness with hypotension and early a.m. of 8/02 and improved with fluid bolus x 1 L.  Noted elevated lactate and had to be hydrated.  Chest x-ray with mild right base atelectasis.  CT of the head showed expected evolution without hemorrhagic transformation.  Therapy evaluations completed due to patient decreased functional mobility was admitted  for a comprehensive rehab program.   HPI  Patient has been at home since discharge from rehab supported by home health aides [facility] by [facility].  The patient followed up with PCP Dr. Virgia after an episode of tachycardia noted at this office.  According to the wife Dr. Virgia was not sure what to do with the methylphenidate  as this may have been provoking the tachycardia.  The patient does have SVT history.  While the patient was on the methylphenidate  in the hospital he did not experience tachycardia.  The patient went to the ED and more serious cardiac abnormalities were ruled out.  Patient remains on methylphenidate  and his heart rate at this time is 112 bpm.  The patient has severe cognitive deficits and is essentially nonverbal although with repeated coaxing may say single words and repetition.  Pain Inventory Average Pain 0 Pain Right Now 0 My pain is .  In the last 24 hours, has pain interfered with the following? General activity 0 Relation with others 0 Enjoyment of life 0 What TIME of day is your pain at its worst? varies Sleep (in general) Fair  Pain is worse with: . Pain improves with: . Relief from Meds: .  Family History  Problem Relation Age of Onset   Rheum arthritis Mother    Diabetes Mother    Stroke Mother    Heart attack Mother    Kidney failure Mother    Heart attack Father    Heart disease Maternal Grandmother    Rheum arthritis Maternal Grandmother    Diabetes Maternal Grandmother    Stroke Maternal Grandmother    Colon cancer Neg Hx    Social History  Socioeconomic History   Marital status: Married    Spouse name: Apolinar   Number of children: 1   Years of education: College   Highest education level: Not on file  Occupational History   Occupation: environmental manager  Tobacco Use   Smoking status: Former    Current packs/day: 0.00    Average packs/day: 1 pack/day for 5.0 years (5.0 ttl pk-yrs)    Types: Cigarettes    Start date:  11/30/1976    Quit date: 11/30/1981    Years since quitting: 42.4   Smokeless tobacco: Never  Vaping Use   Vaping status: Never Used  Substance and Sexual Activity   Alcohol  use: No    Alcohol /week: 0.0 standard drinks of alcohol     Comment: hx abuse -- quit:  1963   Drug use: No    Comment: 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   Caffeine Use:  Tea, lots   Social Drivers of Health   Tobacco Use: Medium Risk (04/18/2024)   Patient History    Smoking Tobacco Use: Former    Smokeless Tobacco Use: Never    Passive Exposure: Not on file  Financial Resource Strain: Not on file  Food Insecurity: Patient Unable To Answer (12/31/2023)   Epic    Worried About Programme Researcher, Broadcasting/film/video in the Last Year: Patient unable to answer    Ran Out of Food in the Last Year: Patient unable to answer  Transportation Needs: Patient Unable To Answer (12/31/2023)   Epic    Lack of Transportation (Medical): Patient unable to answer    Lack of Transportation (Non-Medical): Patient unable to answer  Physical Activity: Not on file  Stress: Not on file  Social Connections: Patient Unable To Answer (12/31/2023)   Social Connection and Isolation Panel    Frequency of Communication with Friends and Family: Patient unable to answer    Frequency of Social Gatherings with Friends and Family: Patient unable to answer    Attends Religious Services: Patient unable to answer    Active Member of Clubs or Organizations: Patient unable to answer    Attends Banker Meetings: Patient unable to answer    Marital Status: Patient unable to answer  Depression (PHQ2-9): High Risk (04/18/2024)   Depression (PHQ2-9)    PHQ-2 Score: 14  Alcohol  Screen: Not on file  Housing: Patient Unable To Answer (12/31/2023)   Epic    Unable to Pay for Housing in the Last Year: Patient unable  to answer    Number of Times Moved in the Last Year: Not on file    Homeless in the Last Year: Patient unable to answer  Utilities: Patient Unable To Answer (12/31/2023)   Epic    Threatened with loss of utilities: Patient unable to answer  Health Literacy: Not on file   Past Surgical History:  Procedure Laterality Date   CARDIOVASCULAR STRESS TEST  05/05/2007   normal nuclear study w/ no ischemia/  normal LV fucntion and wall motion , ef60%   COLONOSCOPY  last one 04-06-2012   CYSTO/  LEFT RETROGRADE PYELOGRAM/ CYTOLOGY WASHINGS/  URETEROSCOPY  03/05/2000   INGUINAL HERNIA REPAIR Bilateral 1965 and 1980's   IR 3D INDEPENDENT WKST  07/31/2022   IR ANGIOGRAM PELVIS SELECTIVE OR SUPRASELECTIVE  07/31/2022   IR ANGIOGRAM SELECTIVE EACH ADDITIONAL  VESSEL  07/31/2022   IR ANGIOGRAM SELECTIVE EACH ADDITIONAL VESSEL  07/31/2022   IR ANGIOGRAM SELECTIVE EACH ADDITIONAL VESSEL  07/31/2022   IR ANGIOGRAM SELECTIVE EACH ADDITIONAL VESSEL  07/31/2022   IR EMBO TUMOR ORGAN ISCHEMIA INFARCT INC GUIDE ROADMAPPING  07/31/2022   IR GASTROSTOMY TUBE MOD SED  01/19/2024   IR RADIOLOGIST EVAL & MGMT  06/26/2022   IR RADIOLOGIST EVAL & MGMT  08/08/2022   IR RADIOLOGIST EVAL & MGMT  08/21/2022   IR RADIOLOGIST EVAL & MGMT  03/25/2023   IR US  GUIDE VASC ACCESS RIGHT  07/31/2022   LAPAROSCOPIC INGUINAL HERNIA WITH UMBILICAL HERNIA Right 06/24/2007   LIVER BIOPSY  1980's and 2004   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   Procedure: PACEMAKER IMPLANT;  Surgeon: Waddell Danelle ORN, MD;  Location: [facility];  Service: Cardiovascular;  Laterality: N/A;   SVT ABLATION N/A 11/15/2018   Procedure: SVT ABLATION;  Surgeon: Waddell Danelle ORN, MD;  Location: [facility];  Service: Cardiovascular;  Laterality: N/A;   TRANSTHORACIC ECHOCARDIOGRAM  09/13/2012   moderate LVH,  ef 60-65%/     TRANSURETHRAL RESECTION OF BLADDER TUMOR N/A 08/11/2016   Procedure: TRANSURETHRAL RESECTION OF BLADDER TUMOR (TURBT);   Surgeon: Belvie LITTIE Clara, MD;  Location: [facility];  Service: Urology;  Laterality: N/A;   Past Surgical History:  Procedure Laterality Date   CARDIOVASCULAR STRESS TEST  05/05/2007   normal nuclear study w/ no ischemia/  normal LV fucntion and wall motion , ef60%   COLONOSCOPY  last one 04-06-2012   CYSTO/  LEFT RETROGRADE PYELOGRAM/ CYTOLOGY WASHINGS/  URETEROSCOPY  03/05/2000   INGUINAL HERNIA REPAIR Bilateral 1965 and 1980's   IR 3D INDEPENDENT WKST  07/31/2022   IR ANGIOGRAM PELVIS SELECTIVE OR SUPRASELECTIVE  07/31/2022   IR ANGIOGRAM SELECTIVE EACH ADDITIONAL VESSEL  07/31/2022   IR ANGIOGRAM SELECTIVE EACH ADDITIONAL VESSEL  07/31/2022   IR ANGIOGRAM SELECTIVE EACH ADDITIONAL VESSEL  07/31/2022   IR ANGIOGRAM SELECTIVE EACH ADDITIONAL VESSEL  07/31/2022   IR EMBO TUMOR ORGAN ISCHEMIA INFARCT INC GUIDE ROADMAPPING  07/31/2022   IR GASTROSTOMY TUBE MOD SED  01/19/2024   IR RADIOLOGIST EVAL & MGMT  06/26/2022   IR RADIOLOGIST EVAL & MGMT  08/08/2022   IR RADIOLOGIST EVAL & MGMT  08/21/2022   IR RADIOLOGIST EVAL & MGMT  03/25/2023   IR US  GUIDE VASC ACCESS RIGHT  07/31/2022   LAPAROSCOPIC INGUINAL HERNIA WITH UMBILICAL HERNIA Right 06/24/2007   LIVER BIOPSY  1980's and 2004   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   Procedure: PACEMAKER IMPLANT;  Surgeon: Waddell Danelle ORN, MD;  Location: [facility];  Service: Cardiovascular;  Laterality: N/A;   SVT ABLATION N/A 11/15/2018   Procedure: SVT ABLATION;  Surgeon: Waddell Danelle ORN, MD;  Location: [facility];  Service: Cardiovascular;  Laterality: N/A;   TRANSTHORACIC ECHOCARDIOGRAM  09/13/2012   moderate LVH,  ef 60-65%/     TRANSURETHRAL RESECTION OF BLADDER TUMOR N/A 08/11/2016   Procedure: TRANSURETHRAL RESECTION OF BLADDER TUMOR (TURBT);  Surgeon: Belvie LITTIE Clara, MD;  Location: [facility];  Service: Urology;  Laterality: N/A;   Past Medical History:  Diagnosis Date   AMS (altered  mental status) 03/15/2023   ARF (acute renal failure) 09/30/2023   BPH (benign prostatic hyperplasia)    Cerebral infarction (HCC) 09/13/2012   IMO SNOMED Dx Update Oct 2024     Cerebrovascular disease    Clostridium difficile diarrhea  Congenital anomaly of diaphragm    COVID-19 08/2023   Elevated PSA    Glaucoma, both eyes    Hemorrhoid    Hepatitis B surface antigen positive    02-20-2011   History of adenomatous polyp of colon    2007, 2009 and 2013  tubular adenoma's   History of alcohol  abuse    quit 1963   History of cerebral parenchymal hemorrhage    01/ 2006  left occiptial lobe related to hypertensive crisis   History of CVA (cerebrovascular accident)    09-12-2012  left hippocampus/ amygdala junction and per MRI old white matter infarcts--  per pt residual short- term memory issues   History of fatty infiltration of liver hx visit's at 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    Urethral lesion    urethral mass   UTI (urinary tract infection) 03/15/2023   BP (!) 159/100   Pulse (!) 112   SpO2 92%   Opioid Risk Score:   Fall Risk Score:  `1  Depression screen PHQ 2/9     04/18/2024    1:27 PM 09/15/2012   10:00 AM  Depression screen PHQ 2/9  Decreased Interest 2 0  Down, Depressed, Hopeless 1 0  PHQ - 2 Score 3 0  Altered sleeping 2   Tired, decreased energy 2   Change in appetite 0   Feeling bad or failure about yourself  1   Trouble concentrating 3   Moving slowly or fidgety/restless 3   PHQ-9 Score 14      Review of Systems     Objective:   Physical Exam  General no acute distress Mood and affect are  flat Eyes are open he does not follow verbal commands Masked facies Right upper extremity has antigravity movement but needs active assistance so that he can follow the command. Left upper extremity has no active movement.  There is mild dorsum of the hand swelling.  There is mild extensor contracture of the fingers of the left hand. There is no evidence of joint deformity in bilateral upper extremities.  No pain with range of motion except endrange shoulder on the left. Tone is normal. Lower extremity no pain with range of motion he has full knee extension Lower extremity tone is normal Lower extremity he does not follow verbal commands      Assessment & Plan:   Assessment and Plan Assessment & Plan Rehabilitation and physical therapy Limited progress in therapy with risk of regression. No significant improvement expected. - Continue physical therapy to maintain progress and prevent regression. - Encourage sitting in a chair during the day as recommended by the physical therapist.  Tachycardia Elevated pulse rates likely due to Methylphenidate . Pacemaker heart rate almost doubled. Risks of Ritalin  outweigh benefits. - Discontinue Methylphenidate  (Ritalin ) due to elevated heart rate.  Nutritional supplementation Requires multivitamin supplement. Current pill form difficult to administer via feeding tube. - Consult with a pharmacist to find a suitable liquid multivitamin for administration via feeding tube.  Feeding tube care Caregiver unsure  of proper cleaning technique. - Instruct caregiver to clean the feeding tube area with saline and a Q-tip, then dry it.  Follow-up with primary care, given poor functional prognosis do not think that physical medicine rehab follow-up is needed.

## 2024-06-16 ENCOUNTER — Encounter: Admitting: Physical Medicine & Rehabilitation

## 2024-06-27 ENCOUNTER — Ambulatory Visit: Admitting: Cardiovascular Disease

## 2024-07-05 ENCOUNTER — Ambulatory Visit: Admitting: Cardiovascular Disease

## 2024-07-08 ENCOUNTER — Ambulatory Visit: Payer: Medicare PPO

## 2024-08-03 ENCOUNTER — Ambulatory Visit: Admitting: Cardiovascular Disease

## 2024-10-07 ENCOUNTER — Ambulatory Visit

## 2025-01-06 ENCOUNTER — Ambulatory Visit

## 2025-04-07 ENCOUNTER — Ambulatory Visit

## 2025-07-07 ENCOUNTER — Ambulatory Visit

## 2025-10-06 ENCOUNTER — Ambulatory Visit
